# Patient Record
Sex: Male | Born: 1955
Health system: Southern US, Community
[De-identification: ages and names within clinical notes are randomized; demographics above are authoritative.]

## PROBLEM LIST (undated history)

## (undated) DIAGNOSIS — I739 Peripheral vascular disease, unspecified: Secondary | ICD-10-CM

## (undated) DIAGNOSIS — Z7901 Long term (current) use of anticoagulants: Secondary | ICD-10-CM

## (undated) DIAGNOSIS — Z91199 Patient's noncompliance with other medical treatment and regimen due to unspecified reason: Secondary | ICD-10-CM

## (undated) DIAGNOSIS — I2510 Atherosclerotic heart disease of native coronary artery without angina pectoris: Secondary | ICD-10-CM

## (undated) DIAGNOSIS — I10 Essential (primary) hypertension: Secondary | ICD-10-CM

## (undated) DIAGNOSIS — E119 Type 2 diabetes mellitus without complications: Secondary | ICD-10-CM

## (undated) DIAGNOSIS — I4891 Unspecified atrial fibrillation: Secondary | ICD-10-CM

## (undated) DIAGNOSIS — Z9119 Patient's noncompliance with other medical treatment and regimen: Secondary | ICD-10-CM

## (undated) DIAGNOSIS — I219 Acute myocardial infarction, unspecified: Secondary | ICD-10-CM

## (undated) DIAGNOSIS — I509 Heart failure, unspecified: Secondary | ICD-10-CM

## (undated) DIAGNOSIS — E7800 Pure hypercholesterolemia, unspecified: Secondary | ICD-10-CM

## (undated) DIAGNOSIS — Z95810 Presence of automatic (implantable) cardiac defibrillator: Secondary | ICD-10-CM

## (undated) HISTORY — DX: Long term (current) use of anticoagulants: Z79.01

## (undated) HISTORY — PX: IMPLANTABLE CARDIOVERTER DEFIBRILLATOR IMPLANT: SHX5860

## (undated) HISTORY — DX: Atherosclerotic heart disease of native coronary artery without angina pectoris: I25.10

## (undated) HISTORY — DX: Unspecified atrial fibrillation: I48.91

## (undated) HISTORY — DX: Peripheral vascular disease, unspecified: I73.9

## (undated) HISTORY — DX: Patient's noncompliance with other medical treatment and regimen due to unspecified reason: Z91.199

## (undated) HISTORY — DX: Patient's noncompliance with other medical treatment and regimen: Z91.19

---

## 2000-09-24 ENCOUNTER — Encounter: Payer: Self-pay | Admitting: Emergency Medicine

## 2000-09-24 ENCOUNTER — Emergency Department (HOSPITAL_COMMUNITY): Admission: EM | Admit: 2000-09-24 | Discharge: 2000-09-25 | Payer: Self-pay

## 2003-12-21 ENCOUNTER — Ambulatory Visit: Payer: Self-pay | Admitting: Internal Medicine

## 2003-12-21 ENCOUNTER — Ambulatory Visit: Payer: Self-pay | Admitting: *Deleted

## 2004-03-14 ENCOUNTER — Ambulatory Visit: Payer: Self-pay | Admitting: Internal Medicine

## 2004-03-21 ENCOUNTER — Ambulatory Visit: Payer: Self-pay | Admitting: Internal Medicine

## 2004-03-28 ENCOUNTER — Ambulatory Visit: Payer: Self-pay | Admitting: Internal Medicine

## 2004-10-01 ENCOUNTER — Ambulatory Visit: Payer: Self-pay | Admitting: Internal Medicine

## 2005-03-11 ENCOUNTER — Ambulatory Visit: Payer: Self-pay | Admitting: Internal Medicine

## 2005-05-22 ENCOUNTER — Ambulatory Visit: Payer: Self-pay | Admitting: Internal Medicine

## 2006-02-06 ENCOUNTER — Ambulatory Visit: Payer: Self-pay | Admitting: Internal Medicine

## 2006-10-30 DIAGNOSIS — I10 Essential (primary) hypertension: Secondary | ICD-10-CM | POA: Insufficient documentation

## 2006-10-30 DIAGNOSIS — E109 Type 1 diabetes mellitus without complications: Secondary | ICD-10-CM | POA: Insufficient documentation

## 2006-12-10 ENCOUNTER — Encounter (INDEPENDENT_AMBULATORY_CARE_PROVIDER_SITE_OTHER): Payer: Self-pay | Admitting: *Deleted

## 2007-02-13 ENCOUNTER — Ambulatory Visit: Payer: Self-pay | Admitting: Internal Medicine

## 2008-05-30 ENCOUNTER — Inpatient Hospital Stay (HOSPITAL_COMMUNITY): Admission: EM | Admit: 2008-05-30 | Discharge: 2008-05-31 | Payer: Self-pay | Admitting: Emergency Medicine

## 2008-05-30 ENCOUNTER — Encounter (INDEPENDENT_AMBULATORY_CARE_PROVIDER_SITE_OTHER): Payer: Self-pay | Admitting: Internal Medicine

## 2010-07-05 LAB — CBC
HCT: 41 % (ref 39.0–52.0)
HCT: 41.7 % (ref 39.0–52.0)
Hemoglobin: 14.2 g/dL (ref 13.0–17.0)
MCHC: 33.8 g/dL (ref 30.0–36.0)
MCHC: 34 g/dL (ref 30.0–36.0)
MCV: 79 fL (ref 78.0–100.0)
MCV: 80.2 fL (ref 78.0–100.0)
Platelets: 232 10*3/uL (ref 150–400)
Platelets: 232 10*3/uL (ref 150–400)
RBC: 5.28 MIL/uL (ref 4.22–5.81)
RDW: 15.5 % (ref 11.5–15.5)
WBC: 11.3 10*3/uL — ABNORMAL HIGH (ref 4.0–10.5)
WBC: 9.7 10*3/uL (ref 4.0–10.5)

## 2010-07-05 LAB — URINALYSIS, ROUTINE W REFLEX MICROSCOPIC
Bilirubin Urine: NEGATIVE
Glucose, UA: >1000 mg/dL — AB
Hgb urine dipstick: NEGATIVE
Ketones, ur: NEGATIVE mg/dL
Leukocytes, UA: NEGATIVE
Nitrite: NEGATIVE
Protein, ur: >300 mg/dL — AB
Specific Gravity, Urine: 1.022 (ref 1.005–1.030)
Urobilinogen, UA: 1 mg/dL (ref 0.0–1.0)
pH: 6.5 (ref 5.0–8.0)

## 2010-07-05 LAB — COMPREHENSIVE METABOLIC PANEL
ALT: 18 U/L (ref 0–53)
Albumin: 3 g/dL — ABNORMAL LOW (ref 3.5–5.2)
Alkaline Phosphatase: 94 U/L (ref 39–117)
Potassium: 3.1 mEq/L — ABNORMAL LOW (ref 3.5–5.1)
Sodium: 138 mEq/L (ref 135–145)
Total Protein: 5.8 g/dL — ABNORMAL LOW (ref 6.0–8.3)

## 2010-07-05 LAB — DIFFERENTIAL
Lymphocytes Relative: 12 % (ref 12–46)
Lymphs Abs: 1.3 10*3/uL (ref 0.7–4.0)
Monocytes Absolute: 0.6 10*3/uL (ref 0.1–1.0)
Monocytes Relative: 5 % (ref 3–12)
Neutro Abs: 9.2 10*3/uL — ABNORMAL HIGH (ref 1.7–7.7)
Neutrophils Relative %: 82 % — ABNORMAL HIGH (ref 43–77)

## 2010-07-05 LAB — POCT I-STAT, CHEM 8
BUN: 17 mg/dL (ref 6–23)
Chloride: 100 mEq/L (ref 96–112)
Creatinine, Ser: 1 mg/dL (ref 0.4–1.5)
Glucose, Bld: 326 mg/dL — ABNORMAL HIGH (ref 70–99)
HCT: 45 % (ref 39.0–52.0)
Potassium: 3.7 mEq/L (ref 3.5–5.1)

## 2010-07-05 LAB — URINE MICROSCOPIC-ADD ON

## 2010-07-05 LAB — CARDIAC PANEL(CRET KIN+CKTOT+MB+TROPI)
CK, MB: 1.5 ng/mL (ref 0.3–4.0)
Relative Index: INVALID (ref 0.0–2.5)
Total CK: 39 U/L (ref 7–232)
Troponin I: 0.06 ng/mL (ref 0.00–0.06)
Troponin I: 0.08 ng/mL — ABNORMAL HIGH (ref 0.00–0.06)

## 2010-07-05 LAB — POCT CARDIAC MARKERS
CKMB, poc: <1 ng/mL — ABNORMAL LOW (ref 1.0–8.0)
Troponin i, poc: <0.05 ng/mL (ref 0.00–0.09)

## 2010-07-05 LAB — GLUCOSE, CAPILLARY
Glucose-Capillary: 165 mg/dL — ABNORMAL HIGH (ref 70–99)
Glucose-Capillary: 218 mg/dL — ABNORMAL HIGH (ref 70–99)
Glucose-Capillary: 237 mg/dL — ABNORMAL HIGH (ref 70–99)
Glucose-Capillary: 244 mg/dL — ABNORMAL HIGH (ref 70–99)

## 2010-07-05 LAB — LIPID PANEL
HDL: 24 mg/dL — ABNORMAL LOW (ref >39)
Total CHOL/HDL Ratio: 5.8 RATIO
VLDL: 17 mg/dL (ref 0–40)

## 2010-07-05 LAB — HEMOGLOBIN A1C: Mean Plasma Glucose: 237 mg/dL

## 2010-07-05 LAB — BRAIN NATRIURETIC PEPTIDE: Pro B Natriuretic peptide (BNP): 640 pg/mL — ABNORMAL HIGH (ref 0.0–100.0)

## 2010-08-07 NOTE — H&P (Signed)
NAMEJOHNNIE, Jacob [name] NO.:  [id number]   MEDICAL RECORD NO.:  [id number]          PATIENT TYPE:  EMS   LOCATION:  MAJO                         FACILITY:  [facility]   PHYSICIAN:  Richarda Overlie, MD       DATE OF BIRTH:  1955-03-26   DATE OF ADMISSION:  05/30/2008  DATE OF DISCHARGE:                              HISTORY & PHYSICAL   CHIEF COMPLAINT:  Dyspnea.   SUBJECTIVE:  A 55 year old male who presents to the ER with the chief  complaint of hypertensive urgency, shortness of breath.  The patient  started noticing acute onset of shortness of breath yesterday afternoon,  but has had intermittent shortness of breath over the last several  weeks.  He also noticed some bilateral lower extremity edema.  He denies  any fevers, chills or rigors or nonproductive cough.   PAST MEDICAL HISTORY:  1. Hypertension.  2. Diabetes.   SOCIAL HISTORY:  Nondrinker, no drug abuse, but smoking in the last 12  months.   ALLERGIES:  No known drug allergies.   HOME MEDICATIONS:  1. Hydrochlorothiazide 25 mg p.o. daily.  2. Pravastatin 80 mg p.o. daily.  3. Verapamil 240 mg p.o. daily.  4. Lisinopril 20 mg p.o. daily.  5. Indapamide 2.5 mg p.o. daily.  6. Aspirin 325 mg p.o. daily.   PHYSICAL EXAMINATION:  INITIAL VITAL SIGNS:  Show a blood pressure of  241/152, pulse of 120, respirations 32, temperature 97.3.  GENERAL:  The patient appears to be comfortable, currently in no acute  distress.  HEENT:  Pupils equal and reactive.  Extraocular movements intact.  LUNGS:  Bilateral basilar crackles at the bases.  CARDIOVASCULAR:  Regular rate and rhythm.  ABDOMEN:  Soft, nontender, nondistended.  EXTREMITIES:  2+ pitting edema.  NEUROLOGIC:  Cranial nerves II-XII appear to be grossly intact.   WBC 11.3, hemoglobin 14.2, hematocrit 41.7, platelet count of 232.  Sodium 136, potassium 3.7, chloride 100, glucose 326, BUN 17, creatinine  1.0.  Troponin less than 0.05.  Urinalysis shows a  protein of greater  than 300.   Chest x-ray shows acute pulmonary edema with superimposed lower lobe  atelectasis and cardiomegaly.   ASSESSMENT AND PLAN:  1. Hypertensive urgency:  The patient will be admitted to the step-      down unit for closer monitoring of his high blood pressure.  He      will be continued on his home medications, hydrochlorothiazide,      verapamil, lisinopril, and will be initiated on clonidine and IV      hydralazine.  He will be ruled out for an acute coronary syndrome.      Will check his thyroid function.  Will also check his urine to rule      out any underlying hematuria.  2. Shortness of breath with elevated B-type natriuretic peptide of 640      and acute pulmonary edema on chest x-ray:  The patient will be      started on diuresis with IV Lasix and will initiate him on  nitroglycerin paste 1 inch q.6.  Cycle cardiac enzymes.  Monitor      patient on telemetry to rule out ACS and underlying dysrhythmias as      a precipitating cause.  3. Dyslipidemia:  Continue with pravastatin.  Check a lipid panel.  4. Hypokalemia:  Replete.   DISPOSITION:  Monitor in the step-down unit.  He is a full code.      Richarda Overlie, MD  Electronically Signed     NA/MEDQ  D:  05/30/2008  T:  05/30/2008  Job:  [id number]

## 2010-08-07 NOTE — Discharge Summary (Signed)
Jacob [name], MCKIVER NO.:  [id number]   MEDICAL RECORD NO.:  [id number]          PATIENT TYPE:  INP   LOCATION:  2921                         FACILITY:  [facility]   PHYSICIAN:  Michelene Gardener, MD    DATE OF BIRTH:  [birth year]   DATE OF ADMISSION:  05/30/2008  DATE OF DISCHARGE:                               DISCHARGE SUMMARY   DISCHARGE DIAGNOSES:  1. Hypertensive emergency secondary to noncompliance.  2. Diastolic congestive heart failure with mild component of systolic      congestive heart failure with echocardiogram showing EF of 50%.  3. Mild pericardial effusion.  4. Mild pleural effusion.  5. Diabetes mellitus.  6. Hyperlipidemia.   DISCHARGE MEDICATIONS:  1. Hydrochlorothiazide 25 mg once a day.  2. Pravastatin 80 mg once a day.  3. Verapamil 240 mg once a day.  4. Lisinopril 200 mg once a day.  5. Indapamide 2.5 mg once a day.  6. Aspirin 325 mg once a day.   CONSULTATIONS:  None.   PROCEDURES:  None.   RADIOLOGY STUDIES:  1. Chest x-ray on May 30, 2008, showed acute pulmonary edema with      cardiomegaly.  2. Echocardiogram on May 30, 2008 showed ejection fraction of 50%      with no wall motion abnormalities and the small pericardial      effusion with moderate size left pleural effusion.   COURSE OF HOSPITALIZATION:  1. Hypertensive emergency secondary to noncompliance.  This patient is      a known hypertensive and he has been on hydrochlorothiazide,      verapamil, and lisinopril.  He has stopped taking his medication      since September.  He came in with increasing shortness of breath.      Blood pressure was found to be 240/150.  The patient was admitted      to Step-Down unit.  He was restarted on his medications with p.r.n.      medications.  The patient's blood pressure improved very well.      Currently, as of seen him his last recorded blood pressure is      115/62.  The patient himself feels very fine.  There is no chest  pain.  There is no shortness of breath.  We will be discharge on      his home medications that include hydrochlorothiazide in addition      to verapamil and lisinopril.  2. Diastolic congestive heart failure.  His current admission x-ray      showed congestion with small pleural effusion and pericardial      effusion.  I discussed those findings with him and I urged him to      stay in the hospital to continue on diuresis and to repeat his x-      ray.  The patient insisted of leaving the hospital and stated that      if we did not discharge him then he would leave against medical      advice.  The patient has no symptoms.  He  does not have chest pain.      His shortness of breath resolved.  I will resume his medications      including ACE inhibitors and lisinopril.  I will also put him on      Lasix to be taken at least for 1 week until he is reevaluated by      his primary doctor.  Echocardiogram was done and showed normal      ejection fraction with small pericardial effusion.  3. Pericardial effusion.  This is a very small.  No need for further      intervention at the present time.  The patient was made aware of      his condition and he was advised to follow with his primary doctor      in that regard.  4. Left pleural effusion.  He has pleural effusion that seems to be      part of his diastolic congestive heart failure.  As mentioned      above, he was in ACE inhibitors that was restarted.  I will also      discharge him on Lasix.  The patient was advised to contact      [facility] for followup and to repeat his x-ray within a few days.  5. Diabetes mellitus.  His medications were restarted in the hospital.  6. Hyperlipidemia.  He will be restarted pravastatin.   As the patient has been noncompliant on his medications and was not  taking any of his medications since last September and apparently his  current condition exacerbated because of his noncompliance.  I have a  long  discussion with him regarding compliance with medications and he  stated that he would be more compliant.  I will give him prescription  for 1 month and he will follow with [facility] for further adjustment  of his medications.  Currently, he is very stable.  His exam is  negative.  He does not have any chest pain and he does not have  shortness of breath.  He refused repeat x-ray to follow his congestion.   Total assessment time is 40 minutes.      Michelene Gardener, MD  Electronically Signed     NAE/MEDQ  D:  05/31/2008  T:  06/01/2008  Job:  [id number]

## 2011-10-01 ENCOUNTER — Inpatient Hospital Stay (HOSPITAL_COMMUNITY)
Admission: EM | Admit: 2011-10-01 | Discharge: 2011-10-02 | DRG: 195 | Discharge status: Against Medical Advice | Payer: Medicaid Other | Attending: Internal Medicine | Admitting: Internal Medicine

## 2011-10-01 ENCOUNTER — Emergency Department (HOSPITAL_COMMUNITY): Payer: Medicaid Other

## 2011-10-01 ENCOUNTER — Encounter (HOSPITAL_COMMUNITY): Payer: Self-pay | Admitting: *Deleted

## 2011-10-01 DIAGNOSIS — F17200 Nicotine dependence, unspecified, uncomplicated: Secondary | ICD-10-CM | POA: Diagnosis present

## 2011-10-01 DIAGNOSIS — I10 Essential (primary) hypertension: Secondary | ICD-10-CM | POA: Diagnosis present

## 2011-10-01 DIAGNOSIS — I4891 Unspecified atrial fibrillation: Secondary | ICD-10-CM | POA: Diagnosis present

## 2011-10-01 DIAGNOSIS — E109 Type 1 diabetes mellitus without complications: Secondary | ICD-10-CM | POA: Diagnosis present

## 2011-10-01 DIAGNOSIS — E876 Hypokalemia: Secondary | ICD-10-CM | POA: Diagnosis present

## 2011-10-01 DIAGNOSIS — Z79899 Other long term (current) drug therapy: Secondary | ICD-10-CM

## 2011-10-01 DIAGNOSIS — I482 Chronic atrial fibrillation, unspecified: Secondary | ICD-10-CM

## 2011-10-01 DIAGNOSIS — I509 Heart failure, unspecified: Secondary | ICD-10-CM | POA: Diagnosis present

## 2011-10-01 DIAGNOSIS — J189 Pneumonia, unspecified organism: Principal | ICD-10-CM

## 2011-10-01 HISTORY — DX: Essential (primary) hypertension: I10

## 2011-10-01 LAB — URINALYSIS, ROUTINE W REFLEX MICROSCOPIC
Ketones, ur: NEGATIVE mg/dL
Nitrite: NEGATIVE
Urobilinogen, UA: 2 mg/dL — ABNORMAL HIGH (ref 0.0–1.0)

## 2011-10-01 LAB — URINE MICROSCOPIC-ADD ON

## 2011-10-01 LAB — COMPREHENSIVE METABOLIC PANEL
AST: 19 U/L (ref 0–37)
Albumin: 2.5 g/dL — ABNORMAL LOW (ref 3.5–5.2)
Alkaline Phosphatase: 157 U/L — ABNORMAL HIGH (ref 39–117)
BUN: 18 mg/dL (ref 6–23)
Chloride: 98 mEq/L (ref 96–112)
Potassium: 3.5 mEq/L (ref 3.5–5.1)
Total Bilirubin: 0.9 mg/dL (ref 0.3–1.2)
Total Protein: 6.5 g/dL (ref 6.0–8.3)

## 2011-10-01 LAB — CBC WITH DIFFERENTIAL/PLATELET
Basophils Absolute: 0 10*3/uL (ref 0.0–0.1)
Basophils Relative: 0 % (ref 0–1)
Eosinophils Absolute: 0.1 10*3/uL (ref 0.0–0.7)
Hemoglobin: 15.1 g/dL (ref 13.0–17.0)
MCH: 25.9 pg — ABNORMAL LOW (ref 26.0–34.0)
MCHC: 33.2 g/dL (ref 30.0–36.0)
Monocytes Relative: 14 % — ABNORMAL HIGH (ref 3–12)
Neutro Abs: 7.9 10*3/uL — ABNORMAL HIGH (ref 1.7–7.7)
Neutrophils Relative %: 72 % (ref 43–77)
Platelets: 251 10*3/uL (ref 150–400)
RDW: 17 % — ABNORMAL HIGH (ref 11.5–15.5)

## 2011-10-01 LAB — CBC
Hemoglobin: 15.2 g/dL (ref 13.0–17.0)
MCH: 25.6 pg — ABNORMAL LOW (ref 26.0–34.0)
MCHC: 32.6 g/dL (ref 30.0–36.0)

## 2011-10-01 LAB — CARDIAC PANEL(CRET KIN+CKTOT+MB+TROPI): Relative Index: INVALID (ref 0.0–2.5)

## 2011-10-01 LAB — PRO B NATRIURETIC PEPTIDE: Pro B Natriuretic peptide (BNP): 3806 pg/mL — ABNORMAL HIGH (ref 0–125)

## 2011-10-01 MED ORDER — SODIUM CHLORIDE 0.9 % IV SOLN: 250.0000 mL | INTRAVENOUS | Status: DC | PRN

## 2011-10-01 MED ORDER — DOCUSATE SODIUM 100 MG PO CAPS
100.0000 mg | ORAL_CAPSULE | Freq: Two times a day (BID) | ORAL | Status: DC
  Administered 2011-10-01 – 2011-10-02 (×2): 100 mg via ORAL
  Filled 2011-10-01 (×3): qty 1

## 2011-10-01 MED ORDER — DEXTROSE 5 % IV SOLN
500.0000 mg | INTRAVENOUS | Status: DC
  Administered 2011-10-01: 500 mg via INTRAVENOUS
  Filled 2011-10-01 (×2): qty 500

## 2011-10-01 MED ORDER — LISINOPRIL 20 MG PO TABS
30.0000 mg | ORAL_TABLET | Freq: Every day | ORAL | Status: DC
  Administered 2011-10-02: 30 mg via ORAL
  Filled 2011-10-01: qty 1

## 2011-10-01 MED ORDER — ACETAMINOPHEN 650 MG RE SUPP: 650.0000 mg | Freq: Four times a day (QID) | RECTAL | Status: DC | PRN

## 2011-10-01 MED ORDER — ONDANSETRON HCL 4 MG PO TABS: 4.0000 mg | ORAL_TABLET | Freq: Four times a day (QID) | ORAL | Status: DC | PRN

## 2011-10-01 MED ORDER — ENOXAPARIN SODIUM 40 MG/0.4ML ~~LOC~~ SOLN
40.0000 mg | SUBCUTANEOUS | Status: DC
  Administered 2011-10-01: 40 mg via SUBCUTANEOUS
  Filled 2011-10-01 (×2): qty 0.4

## 2011-10-01 MED ORDER — METOPROLOL TARTRATE 50 MG PO TABS
50.0000 mg | ORAL_TABLET | Freq: Two times a day (BID) | ORAL | Status: DC
  Administered 2011-10-01 – 2011-10-02 (×2): 50 mg via ORAL
  Filled 2011-10-01 (×3): qty 1

## 2011-10-01 MED ORDER — IPRATROPIUM BROMIDE 0.02 % IN SOLN
0.5000 mg | Freq: Four times a day (QID) | RESPIRATORY_TRACT | Status: DC
  Administered 2011-10-01 – 2011-10-02 (×3): 0.5 mg via RESPIRATORY_TRACT
  Filled 2011-10-01 (×4): qty 2.5

## 2011-10-01 MED ORDER — LEVALBUTEROL HCL 0.63 MG/3ML IN NEBU
0.6300 mg | INHALATION_SOLUTION | Freq: Four times a day (QID) | RESPIRATORY_TRACT | Status: DC
  Filled 2011-10-01 (×3): qty 3

## 2011-10-01 MED ORDER — GLIPIZIDE 5 MG PO TABS
5.0000 mg | ORAL_TABLET | Freq: Every day | ORAL | Status: DC
  Administered 2011-10-02: 5 mg via ORAL
  Filled 2011-10-01: qty 1

## 2011-10-01 MED ORDER — DEXTROSE 5 % IV SOLN
1.0000 g | INTRAVENOUS | Status: DC
  Administered 2011-10-01: 1 g via INTRAVENOUS
  Filled 2011-10-01 (×2): qty 10

## 2011-10-01 MED ORDER — SODIUM CHLORIDE 0.9 % IJ SOLN
3.0000 mL | Freq: Two times a day (BID) | INTRAMUSCULAR | Status: DC
  Administered 2011-10-01: 3 mL via INTRAVENOUS

## 2011-10-01 MED ORDER — SODIUM CHLORIDE 0.9 % IV SOLN
Freq: Once | INTRAVENOUS | Status: AC
  Administered 2011-10-01: 15:00:00 via INTRAVENOUS

## 2011-10-01 MED ORDER — ONDANSETRON HCL 4 MG/2ML IJ SOLN: 4.0000 mg | Freq: Four times a day (QID) | INTRAMUSCULAR | Status: DC | PRN

## 2011-10-01 MED ORDER — FUROSEMIDE 10 MG/ML IJ SOLN
60.0000 mg | Freq: Once | INTRAMUSCULAR | Status: AC
  Administered 2011-10-01: 60 mg via INTRAVENOUS
  Filled 2011-10-01: qty 8

## 2011-10-01 MED ORDER — FUROSEMIDE 10 MG/ML IJ SOLN
60.0000 mg | Freq: Two times a day (BID) | INTRAMUSCULAR | Status: DC
  Administered 2011-10-01 – 2011-10-02 (×2): 60 mg via INTRAVENOUS
  Filled 2011-10-01 (×4): qty 6

## 2011-10-01 MED ORDER — LEVALBUTEROL HCL 0.63 MG/3ML IN NEBU
0.6300 mg | INHALATION_SOLUTION | Freq: Four times a day (QID) | RESPIRATORY_TRACT | Status: DC
  Administered 2011-10-01 – 2011-10-02 (×3): 0.63 mg via RESPIRATORY_TRACT
  Filled 2011-10-01 (×7): qty 3

## 2011-10-01 MED ORDER — SODIUM CHLORIDE 0.9 % IV SOLN
INTRAVENOUS | Status: AC
  Administered 2011-10-01: 18:00:00 via INTRAVENOUS

## 2011-10-01 MED ORDER — METOPROLOL TARTRATE 12.5 MG HALF TABLET
12.5000 mg | ORAL_TABLET | Freq: Two times a day (BID) | ORAL | Status: DC
  Filled 2011-10-01: qty 1

## 2011-10-01 MED ORDER — METOPROLOL TARTRATE 1 MG/ML IV SOLN: 5.0000 mg | Freq: Four times a day (QID) | INTRAVENOUS | Status: DC | PRN

## 2011-10-01 MED ORDER — SODIUM CHLORIDE 0.9 % IJ SOLN: 3.0000 mL | INTRAMUSCULAR | Status: DC | PRN

## 2011-10-01 MED ORDER — SODIUM CHLORIDE 0.9 % IJ SOLN
3.0000 mL | Freq: Two times a day (BID) | INTRAMUSCULAR | Status: DC
  Administered 2011-10-02: 3 mL via INTRAVENOUS

## 2011-10-01 MED ORDER — ACETAMINOPHEN 325 MG PO TABS: 650.0000 mg | ORAL_TABLET | Freq: Four times a day (QID) | ORAL | Status: DC | PRN

## 2011-10-01 MED ORDER — DILTIAZEM HCL 25 MG/5ML IV SOLN
10.0000 mg | Freq: Once | INTRAVENOUS | Status: AC
  Administered 2011-10-01: 10 mg via INTRAVENOUS
  Filled 2011-10-01 (×2): qty 5

## 2011-10-01 NOTE — H&P (Signed)
Jacob [name] MRN: [id number] DOB/AGE: January 05, 1956 56 y.o. Primary Care Physician:No primary provider on file. Admit date: 10/01/2011 Chief Complaint: Leg Swelling  HPI: The history is provided by the patient. 56 yr old male  Presents  with bilateral lower extremity edema x1 week and dyspnea on exertion.He has a hx of DM but discontinued all meds due to lack of insurance .Has not had any PCP follow up in several years .  No chest pain or chest pressure but some nonproductive cough  For the last couple of days . No prior history of CHF. Does not take any diuretics. No fever or chills. No vomiting or diarrhea. Nothing makes the symptoms better and no medications used prior to arrival. He does have a history of high blood pressure diabetes. No pleuritic chest pain noted or recent travel history.  He has noticed dependent edema which was more noticeable a week ago and has worsened since . He denies any DOE , PND, No cardiac HX this far   Past Medical History  Diagnosis Date  . Diabetes mellitus   . Hypertension     History reviewed. No pertinent past surgical history.  Prior to Admission medications   Medication Sig Start Date End Date Taking? Authorizing Provider  glipiZIDE (GLUCOTROL) 5 MG tablet Take 5 mg by mouth daily.   Yes Historical Provider, MD  hydrochlorothiazide (HYDRODIURIL) 25 MG tablet Take 25 mg by mouth daily.   Yes Historical Provider, MD  lisinopril (PRINIVIL,ZESTRIL) 30 MG tablet Take 30 mg by mouth daily.   Yes Historical Provider, MD  verapamil (COVERA HS) 240 MG (CO) 24 hr tablet Take 240 mg by mouth at bedtime.   Yes Historical Provider, MD    Allergies: No Known Allergies   family history. Mother has DM    Social History:  reports that he has been smoking.  He has never used smokeless tobacco. He reports that he does not drink alcohol or use illicit drugs. He is currently unemployed , [employer]    PHYSICAL EXAM: Nursing note and vitals reviewed.    Constitutional: He is oriented to person, place, and time. He appears well-developed and well-nourished. Non-toxic appearance. No distress.  HENT:  Head: Normocephalic and atraumatic.  Eyes: Conjunctivae, EOM and lids are normal. Pupils are equal, round, and reactive to light.  Neck: Normal range of motion. Neck supple. No tracheal deviation present. No mass present.  Cardiovascular: Normal rate, regular rhythm and normal heart sounds. Exam reveals no gallop.  No murmur heard.  Pulmonary/Chest: Effort normal and breath sounds normal. No stridor. No respiratory distress. He has no decreased breath sounds. He has no wheezes. He has no rhonchi. He has no rales.  Abdominal: Soft. Normal appearance and bowel sounds are normal. He exhibits no distension. There is no tenderness. There is no rebound and no CVA tenderness.  Musculoskeletal: Normal range of motion. He exhibits no edema and no tenderness.  Bilateral 3+ lower extremity edema  Neurological: He is alert and oriented to person, place, and time. He has normal strength. No cranial nerve deficit or sensory deficit. GCS eye subscore is 4. GCS verbal subscore is 5. GCS motor subscore is 6.  Skin: Skin is warm and dry. No abrasion and no rash noted.  Psychiatric: He has a normal mood and affect. His speech is normal and behavior is normal.   ROS   Complete 14 point ROS was done with pertinenet positives listed in HPI otherwise negative     No results found  for this or any previous visit (from the past 240 hour(s)).   Results for orders placed during the hospital encounter of 10/01/11 (from the past 48 hour(s))  URINALYSIS, ROUTINE W REFLEX MICROSCOPIC     Status: Abnormal   Collection Time   10/01/11  1:52 PM      Component Value Range Comment   Color, Urine AMBER (*) YELLOW BIOCHEMICALS MAY BE AFFECTED BY COLOR   APPearance CLEAR  CLEAR    Specific Gravity, Urine 1.020  1.005 - 1.030    pH 7.0  5.0 - 8.0    Glucose, UA NEGATIVE  NEGATIVE  mg/dL    Hgb urine dipstick NEGATIVE  NEGATIVE    Bilirubin Urine SMALL (*) NEGATIVE    Ketones, ur NEGATIVE  NEGATIVE mg/dL    Protein, ur >811 (*) NEGATIVE mg/dL    Urobilinogen, UA 2.0 (*) 0.0 - 1.0 mg/dL    Nitrite NEGATIVE  NEGATIVE    Leukocytes, UA NEGATIVE  NEGATIVE   URINE MICROSCOPIC-ADD ON     Status: Normal   Collection Time   10/01/11  1:52 PM      Component Value Range Comment   Squamous Epithelial / LPF RARE  RARE    WBC, UA 0-2  <3 WBC/hpf    RBC / HPF 0-2  <3 RBC/hpf    Urine-Other MUCOUS PRESENT     GLUCOSE, CAPILLARY     Status: Normal   Collection Time   10/01/11  1:58 PM      Component Value Range Comment   Glucose-Capillary 84  70 - 99 mg/dL   CBC WITH DIFFERENTIAL     Status: Abnormal   Collection Time   10/01/11  2:30 PM      Component Value Range Comment   WBC 11.0 (*) 4.0 - 10.5 K/uL    RBC 5.84 (*) 4.22 - 5.81 MIL/uL    Hemoglobin 15.1  13.0 - 17.0 g/dL    HCT 91.4  78.2 - 95.6 %    MCV 77.9 (*) 78.0 - 100.0 fL    MCH 25.9 (*) 26.0 - 34.0 pg    MCHC 33.2  30.0 - 36.0 g/dL    RDW 21.3 (*) 08.6 - 15.5 %    Platelets 251  150 - 400 K/uL    Neutrophils Relative 72  43 - 77 %    Neutro Abs 7.9 (*) 1.7 - 7.7 K/uL    Lymphocytes Relative 13  12 - 46 %    Lymphs Abs 1.4  0.7 - 4.0 K/uL    Monocytes Relative 14 (*) 3 - 12 %    Monocytes Absolute 1.6 (*) 0.1 - 1.0 K/uL    Eosinophils Relative 1  0 - 5 %    Eosinophils Absolute 0.1  0.0 - 0.7 K/uL    Basophils Relative 0  0 - 1 %    Basophils Absolute 0.0  0.0 - 0.1 K/uL   COMPREHENSIVE METABOLIC PANEL     Status: Abnormal   Collection Time   10/01/11  2:30 PM      Component Value Range Comment   Sodium 137  135 - 145 mEq/L    Potassium 3.5  3.5 - 5.1 mEq/L    Chloride 98  96 - 112 mEq/L    CO2 28  19 - 32 mEq/L    Glucose, Bld 73  70 - 99 mg/dL    BUN 18  6 - 23 mg/dL    Creatinine, Ser 5.78  0.50 -  1.35 mg/dL    Calcium 8.6  8.4 - 16.1 mg/dL    Total Protein 6.5  6.0 - 8.3 g/dL    Albumin 2.5 (*) 3.5  - 5.2 g/dL    AST 19  0 - 37 U/L    ALT 17  0 - 53 U/L    Alkaline Phosphatase 157 (*) 39 - 117 U/L    Total Bilirubin 0.9  0.3 - 1.2 mg/dL    GFR calc non Af Amer >90  >90 mL/min    GFR calc Af Amer >90  >90 mL/min   PRO B NATRIURETIC PEPTIDE     Status: Abnormal   Collection Time   10/01/11  2:30 PM      Component Value Range Comment   Pro B Natriuretic peptide (BNP) 3806.0 (*) 0 - 125 pg/mL   CBC     Status: Abnormal   Collection Time   10/01/11  4:55 PM      Component Value Range Comment   WBC 11.3 (*) 4.0 - 10.5 K/uL    RBC 5.94 (*) 4.22 - 5.81 MIL/uL    Hemoglobin 15.2  13.0 - 17.0 g/dL    HCT 09.6  04.5 - 40.9 %    MCV 78.5  78.0 - 100.0 fL    MCH 25.6 (*) 26.0 - 34.0 pg    MCHC 32.6  30.0 - 36.0 g/dL    RDW 81.1 (*) 91.4 - 15.5 %    Platelets 267  150 - 400 K/uL   CARDIAC PANEL(CRET KIN+CKTOT+MB+TROPI)     Status: Normal   Collection Time   10/01/11  4:55 PM      Component Value Range Comment   Total CK 42  7 - 232 U/L    CK, MB 2.7  0.3 - 4.0 ng/mL    Troponin I <0.30  <0.30 ng/mL    Relative Index RELATIVE INDEX IS INVALID  0.0 - 2.5   GLUCOSE, CAPILLARY     Status: Normal   Collection Time   10/01/11  5:36 PM      Component Value Range Comment   Glucose-Capillary 88  70 - 99 mg/dL     Dg Chest 2 View  09/30/2954  *RADIOLOGY REPORT*  Clinical Data: 1-week history of bilateral lower extremity edema. Shortness of breath.  Smoker with history of hypertension and diabetes.  CHEST - 2 VIEW  Comparison: Portable chest x-ray 05/30/2008.  Findings: Heart markedly enlarged but stable.  Thoracic aorta mildly atherosclerotic, unchanged.  Hilar and mediastinal contours otherwise unremarkable.  Pulmonary venous hypertension with minimal interstitial pulmonary edema.  Large right pleural effusion and associated dense consolidation in the right lower lobe.  Small left pleural effusion and minimal passive atelectasis in the left lower lobe.  Fluid extends into the major fissure on the  right.  Mild degenerative changes involving the thoracic spine.  IMPRESSION: Mild CHF, with stable marked cardiomegaly and minimal diffuse interstitial pulmonary edema.  Large right pleural effusion and associated dense passive atelectasis and/or pneumonia in the right lower lobe.  Small left pleural effusion and associated mild passive atelectasis in the left lower lobe.  Original Report Authenticated By: Arnell Sieving, M.D.    Impression:  Active Problems:  DIABETES MELLITUS, TYPE I  HYPERTENSION  A-fib  PNA (pneumonia)     Plan: 1. CAP , RX with rocephin and AZA 2. SOB due to 1. As well as lkely CHF , BNP significantly high, check TSH , 2 d echo,enzymes 3.  Dependent edema: likely due to volume overload , CHF , initiate aggressive diuresis  4. DM SSI , hgA1C ,   Full code       [facility] 10/01/2011, 10:24 PM

## 2011-10-01 NOTE — ED Notes (Signed)
md at bedside  Pt alert and oriented x4. Respirations even and unlabored, bilateral symmetrical rise and fall of chest. Skin warm and dry. In no acute distress. Denies needs.   

## 2011-10-01 NOTE — ED Notes (Signed)
First attempt to call report. Floor rn will call back 

## 2011-10-01 NOTE — ED Notes (Signed)
Pt requested for security to go to his car and get his cell phone out of his car. Security alerted rn who told pt that they were not allowed to do that due to liability reasons. rn found pts contact information on his demographics sheet. Pt reported one of the numbers was his cell phone and was one an old work Clinical cytogeneticist. Pt tried to remember sons phone number, but number does not receive phone calls. rn asked International aid/development worker and she stated the same reason that security cannot go into a pts car for liability reasons. All of this was explain in detail to pt.

## 2011-10-01 NOTE — ED Provider Notes (Signed)
History     CSN: [id number]  Arrival date & time 10/01/11  1256   First MD Initiated Contact with Patient 10/01/11 1357      Chief Complaint  Patient presents with  . Leg Swelling    (Consider location/radiation/quality/duration/timing/severity/associated sxs/prior treatment) The history is provided by the patient.   patient here with bilateral lower extremity edema x1 week and dyspnea on exertion. No chest pain or chest pressure but some nonproductive cough noted. No prior history of CHF. Does not take any diuretics. No fever or chills. No vomiting or diarrhea. Nothing makes the symptoms better and no medications used prior to arrival. He does have a history of high blood pressure diabetes. No pleuritic chest pain noted or recent travel history  Past Medical History  Diagnosis Date  . Diabetes mellitus   . Hypertension     History reviewed. No pertinent past surgical history.  No family history on file.  History  Substance Use Topics  . Smoking status: Current Everyday Smoker -- 0.5 packs/day  . Smokeless tobacco: Not on file  . Alcohol Use: No      Review of Systems  All other systems reviewed and are negative.    Allergies  Review of patient's allergies indicates no known allergies.  Home Medications   Current Outpatient Rx  Name Route Sig Dispense Refill  . GLIPIZIDE 5 MG PO TABS Oral Take 5 mg by mouth daily.    Marland Kitchen HYDROCHLOROTHIAZIDE 25 MG PO TABS Oral Take 25 mg by mouth daily.    Marland Kitchen LISINOPRIL 30 MG PO TABS Oral Take 30 mg by mouth daily.    Marland Kitchen VERAPAMIL HCL ER (CO) 240 MG PO TB24 Oral Take 240 mg by mouth at bedtime.      BP 145/87  Pulse 72  Temp 98 F (36.7 C) (Oral)  Resp 24  Wt 224 lb (101.606 kg)  SpO2 97%  Physical Exam  Nursing note and vitals reviewed. Constitutional: He is oriented to person, place, and time. He appears well-developed and well-nourished.  Non-toxic appearance. No distress.  HENT:  Head: Normocephalic and atraumatic.    Eyes: Conjunctivae, EOM and lids are normal. Pupils are equal, round, and reactive to light.  Neck: Normal range of motion. Neck supple. No tracheal deviation present. No mass present.  Cardiovascular: Normal rate, regular rhythm and normal heart sounds.  Exam reveals no gallop.   No murmur heard. Pulmonary/Chest: Effort normal and breath sounds normal. No stridor. No respiratory distress. He has no decreased breath sounds. He has no wheezes. He has no rhonchi. He has no rales.  Abdominal: Soft. Normal appearance and bowel sounds are normal. He exhibits no distension. There is no tenderness. There is no rebound and no CVA tenderness.  Musculoskeletal: Normal range of motion. He exhibits no edema and no tenderness.       Bilateral 3+ lower extremity edema  Neurological: He is alert and oriented to person, place, and time. He has normal strength. No cranial nerve deficit or sensory deficit. GCS eye subscore is 4. GCS verbal subscore is 5. GCS motor subscore is 6.  Skin: Skin is warm and dry. No abrasion and no rash noted.  Psychiatric: He has a normal mood and affect. His speech is normal and behavior is normal.    ED Course  Procedures (including critical care time)   Labs Reviewed  GLUCOSE, CAPILLARY  URINALYSIS, ROUTINE W REFLEX MICROSCOPIC  CBC WITH DIFFERENTIAL  COMPREHENSIVE METABOLIC PANEL  PRO B NATRIURETIC PEPTIDE  No results found.   No diagnosis found.    MDM   Date: 10/01/2011  Rate: 112   Rhythm: atrial fibrillation  QRS Axis: normal  Intervals: normal  ST/T Wave abnormalities: nonspecific ST changes  Conduction Disutrbances:afib  Narrative Interpretation:   Old EKG Reviewed: none available  3:59 PM Pt given lasix for chf and antibiotics for suspected hcap--will admit to [facility]          Toy Baker, MD 10/01/11 1600

## 2011-10-01 NOTE — ED Notes (Signed)
Pt states "used to go to [facility] but they declined me and never went back, my legs have been swelling x 1 wk"

## 2011-10-01 NOTE — ED Notes (Signed)
Report given to beth, rn on floor.

## 2011-10-02 DIAGNOSIS — J189 Pneumonia, unspecified organism: Principal | ICD-10-CM

## 2011-10-02 DIAGNOSIS — M7989 Other specified soft tissue disorders: Secondary | ICD-10-CM

## 2011-10-02 DIAGNOSIS — I369 Nonrheumatic tricuspid valve disorder, unspecified: Secondary | ICD-10-CM

## 2011-10-02 DIAGNOSIS — I509 Heart failure, unspecified: Secondary | ICD-10-CM

## 2011-10-02 DIAGNOSIS — I10 Essential (primary) hypertension: Secondary | ICD-10-CM

## 2011-10-02 LAB — CARDIAC PANEL(CRET KIN+CKTOT+MB+TROPI)
CK, MB: 2.8 ng/mL (ref 0.3–4.0)
Relative Index: INVALID (ref 0.0–2.5)
Relative Index: INVALID (ref 0.0–2.5)
Total CK: 55 U/L (ref 7–232)
Total CK: 65 U/L (ref 7–232)
Troponin I: <0.3 ng/mL (ref <0.30)

## 2011-10-02 LAB — COMPREHENSIVE METABOLIC PANEL
BUN: 17 mg/dL (ref 6–23)
Calcium: 8.5 mg/dL (ref 8.4–10.5)
GFR calc Af Amer: >90 mL/min (ref >90)
Glucose, Bld: 116 mg/dL — ABNORMAL HIGH (ref 70–99)
Sodium: 135 mEq/L (ref 135–145)
Total Protein: 6.5 g/dL (ref 6.0–8.3)

## 2011-10-02 LAB — TSH: TSH: 1.519 u[IU]/mL (ref 0.350–4.500)

## 2011-10-02 LAB — CBC
HCT: 45.1 % (ref 39.0–52.0)
Hemoglobin: 14.6 g/dL (ref 13.0–17.0)
RBC: 5.68 MIL/uL (ref 4.22–5.81)
WBC: 10.3 10*3/uL (ref 4.0–10.5)

## 2011-10-02 LAB — GLUCOSE, CAPILLARY: Glucose-Capillary: 116 mg/dL — ABNORMAL HIGH (ref 70–99)

## 2011-10-02 NOTE — Progress Notes (Addendum)
VASCULAR LAB PRELIMINARY  PRELIMINARY  PRELIMINARY  PRELIMINARY  Bilateral lower extremity venous duplex  completed.    Preliminary report:  Bilateral:  No evidence of DVT, superficial thrombosis, or Baker's Cyst.  Veins pulsatile consistent with fluid overload.   Johney Perotti, RVT 10/02/2011, 9:47 AM

## 2011-10-02 NOTE — Discharge Summary (Signed)
Physician Discharge Summary  Jacob [name] ZOX:[id number] DOB: February 05, 1956 DOA: 10/01/2011  PCP: No primary provider on file.  Admit date: 10/01/2011 Discharge date: 10/02/2011  Recommendations for Outpatient Follow-up:  1. Patient left AGAINST MEDICAL ADVICE, no PCP 2. Pending studies  2-D echocardiogram done and results pending at the time patient left AMA  Potassium level,-serum K3.0 the time he left AMA Discharge Diagnoses:  Active Problems:  DIABETES MELLITUS, TYPE I  HYPERTENSION  A-fib  PNA (pneumonia)  hypokalemia  Discharge Condition: Hemodynamically stable, and left AMA  Diet recommendation: Heart healthy  History of present illness:  The history is provided by the patient. 56 yr old male Presents with bilateral lower extremity edema x1 week and dyspnea on exertion.He has a hx of DM but discontinued all meds due to lack of insurance .Has not had any PCP follow up in several years . No chest pain or chest pressure but some nonproductive cough For the last couple of days . No prior history of CHF. Does not take any diuretics. No fever or chills. No vomiting or diarrhea. Nothing makes the symptoms better and no medications used prior to arrival. He does have a history of high blood pressure diabetes. No pleuritic chest pain noted or recent travel history.   Hospital Course by problem list:  Active Problems:  DIABETES MELLITUS, TYPE I  HYPERTENSION  A-fib  PNA (pneumonia) Hypokalemia 1. CAP -patient was started on rocephin and zithromax and was still receiving this at the time he left AMA today.  2. CHF -patient's BNP significantly high, cardiac enzymes were done and came back negative. He was started on IV Lasix for diuresis (which was still receiving at the time of his leaving AMA) and his shortness of breath improved. A 2-D echocardiogram was done this a.m. but results pending at the time patient left AMA. A TSH was ordered and came back within normal limits. 3. DM SSI , hgA1C  -Phyllis maintained on his glipizide, his hemoglobin A1c was 6.9.  4. Atrial fibrillation-he received IV or Cardizem and was placed on oral metoprolol during this hospital stay Procedures:  2-D echocardiogram done and results pending at the time patient left AMA  Consultations:  None  Discharge Exam: Filed Vitals:   10/02/11 1339  BP: 133/80  Pulse: 72  Temp: 97.3 F (36.3 C)  Resp: 16   Filed Vitals:   10/02/11 0149 10/02/11 0543 10/02/11 0900 10/02/11 1339  BP:  149/70  133/80  Pulse:  76  72  Temp:  97.4 F (36.3 C)  97.3 F (36.3 C)  TempSrc:  Oral  Oral  Resp:  16  16  Height:      Weight:      SpO2: 97% 92% 98% 98%   General: Middle-aged male in no apparent distress Cardiovascular: Irregularly irregular, rate controlled Respiratory: Basilar crackles, no wheezes  Discharge Instructions   Medication List  As of 10/02/2011  2:48 PM   ASK your doctor about these medications         glipiZIDE 5 MG tablet   Commonly known as: GLUCOTROL   Take 5 mg by mouth daily.      hydrochlorothiazide 25 MG tablet   Commonly known as: HYDRODIURIL   Take 25 mg by mouth daily.      lisinopril 30 MG tablet   Commonly known as: PRINIVIL,ZESTRIL   Take 30 mg by mouth daily.      verapamil 240 MG (CO) 24 hr tablet   Commonly known as:  COVERA HS   Take 240 mg by mouth at bedtime.              The results of significant diagnostics from this hospitalization (including imaging, microbiology, ancillary and laboratory) are listed below for reference.    Significant Diagnostic Studies: Dg Chest 2 View  10/01/2011  *RADIOLOGY REPORT*  Clinical Data: 1-week history of bilateral lower extremity edema. Shortness of breath.  Smoker with history of hypertension and diabetes.  CHEST - 2 VIEW  Comparison: Portable chest x-ray 05/30/2008.  Findings: Heart markedly enlarged but stable.  Thoracic aorta mildly atherosclerotic, unchanged.  Hilar and mediastinal contours otherwise  unremarkable.  Pulmonary venous hypertension with minimal interstitial pulmonary edema.  Large right pleural effusion and associated dense consolidation in the right lower lobe.  Small left pleural effusion and minimal passive atelectasis in the left lower lobe.  Fluid extends into the major fissure on the right.  Mild degenerative changes involving the thoracic spine.  IMPRESSION: Mild CHF, with stable marked cardiomegaly and minimal diffuse interstitial pulmonary edema.  Large right pleural effusion and associated dense passive atelectasis and/or pneumonia in the right lower lobe.  Small left pleural effusion and associated mild passive atelectasis in the left lower lobe.  Original Report Authenticated By: Arnell Sieving, M.D.    Microbiology: No results found for this or any previous visit (from the past 240 hour(s)).   Labs: Basic Metabolic Panel:  Lab 10/02/11 1610 10/01/11 1430  NA 135 137  K 3.0* 3.5  CL 93* 98  CO2 31 28  GLUCOSE 116* 73  BUN 17 18  CREATININE 0.97 0.88  CALCIUM 8.5 8.6  MG -- --  PHOS -- --   Liver Function Tests:  Lab 10/02/11 0456 10/01/11 1430  AST 18 19  ALT 18 17  ALKPHOS 148* 157*  BILITOT 1.0 0.9  PROT 6.5 6.5  ALBUMIN 2.7* 2.5*   No results found for this basename: LIPASE:5,AMYLASE:5 in the last 168 hours No results found for this basename: AMMONIA:5 in the last 168 hours CBC:  Lab 10/02/11 0456 10/01/11 1655 10/01/11 1430  WBC 10.3 11.3* 11.0*  NEUTROABS -- -- 7.9*  HGB 14.6 15.2 15.1  HCT 45.1 46.6 45.5  MCV 79.4 78.5 77.9*  PLT 252 267 251   Cardiac Enzymes:  Lab 10/02/11 0820 10/02/11 0047 10/01/11 1655  CKTOTAL 65 55 42  CKMB 2.8 2.8 2.7  CKMBINDEX -- -- --  TROPONINI <0.30 <0.30 <0.30   BNP: BNP (last 3 results)  Basename 10/01/11 1430  PROBNP 3806.0*   CBG:  Lab 10/02/11 1203 10/02/11 0736 10/01/11 1736 10/01/11 1358  GLUCAP 126* 116* 88 84    Time coordinating discharge:31mins  Signed:  Kela Millin  [facility] 10/02/2011, 2:48 PM

## 2011-10-02 NOTE — Care Management Note (Signed)
    Page 1 of 1   10/02/2011     1:43:06 PM   CARE MANAGEMENT NOTE 10/02/2011  Patient:  DETRELL, UMSCHEID   Account Number:  [id number]  Date Initiated:  10/02/2011  Documentation initiated by:  Lanier Clam  Subjective/Objective Assessment:   ADMITTED W/LOWER LEG SWELLING.HX:DM.     Action/Plan:   FROM HOME   Anticipated DC Date:  10/02/2011   Anticipated DC Plan:  AGAINST MEDICAL ADVICE      DC Planning Services  CM consult      Choice offered to / List presented to:             Status of service:  Completed, signed off Medicare Important Message given?   (If response is "NO", the following Medicare IM given date fields will be blank) Date Medicare IM given:   Date Additional Medicare IM given:    Discharge Disposition:  AGAINST MEDICAL ADVICE  Per UR Regulation:  Reviewed for med. necessity/level of care/duration of stay  If discussed at Long Length of Stay Meetings, dates discussed:    Comments:  10/02/11 Thu Baggett RN,BSN NCM [phone] PATIENT LEAVING AMA.

## 2011-10-02 NOTE — Progress Notes (Signed)
Patient had 4 beats of a wide complex rhythm. Asymptomatic/sitting up in chair-strip place on shadow chart in [place]-a[place]roo. Ginny Forth

## 2011-10-02 NOTE — Progress Notes (Signed)
  Echocardiogram 2D Echocardiogram has been performed.  Jacob [name] 10/02/2011, 1:26 PM

## 2011-10-02 NOTE — Progress Notes (Signed)
Patient left AMA. Dr Donna Bernard aware. Ginny Forth

## 2011-10-02 NOTE — Progress Notes (Signed)
Pt with previous A. Fibrillation on telemetry monitor. Now, NSR with frequent PAC, PVC.  Telemetry strip printed and placed in chart. Will continue to monitor. [place]

## 2011-10-02 NOTE — Progress Notes (Signed)
Patient insistent "I must leave. I must go now." Dr Donna Bernard informed earlier today of patients possible leaving AMA. Pt signed AMA paper. Will notify Dr Donna Bernard of patients actual leaving AMA. IV and telemetry d/c'ed. Pt stable upon leaving. Pt informed regarding risks of him leaving hospital AMA. Ginny Forth

## 2011-11-08 ENCOUNTER — Ambulatory Visit (INDEPENDENT_AMBULATORY_CARE_PROVIDER_SITE_OTHER): Payer: Medicaid Other | Admitting: Cardiovascular Disease

## 2011-11-08 ENCOUNTER — Encounter: Payer: Self-pay | Admitting: Cardiovascular Disease

## 2011-11-08 VITALS — BP 147/100 | HR 61 | Ht 65.0 in | Wt 221.1 lb

## 2011-11-08 DIAGNOSIS — I509 Heart failure, unspecified: Secondary | ICD-10-CM

## 2011-11-08 DIAGNOSIS — I10 Essential (primary) hypertension: Secondary | ICD-10-CM

## 2011-11-08 DIAGNOSIS — I5022 Chronic systolic (congestive) heart failure: Secondary | ICD-10-CM | POA: Insufficient documentation

## 2011-11-08 DIAGNOSIS — E109 Type 1 diabetes mellitus without complications: Secondary | ICD-10-CM

## 2011-11-08 DIAGNOSIS — I4891 Unspecified atrial fibrillation: Secondary | ICD-10-CM

## 2011-11-08 LAB — BASIC METABOLIC PANEL
Calcium: 8.9 mg/dL (ref 8.4–10.5)
GFR: 75.52 mL/min (ref >60.00)
Potassium: 3.8 mEq/L (ref 3.5–5.1)
Sodium: 138 mEq/L (ref 135–145)

## 2011-11-08 MED ORDER — CARVEDILOL 12.5 MG PO TABS: 12.5000 mg | ORAL_TABLET | Freq: Two times a day (BID) | ORAL | Status: DC

## 2011-11-08 MED ORDER — SPIRONOLACTONE 25 MG PO TABS: 25.0000 mg | ORAL_TABLET | Freq: Every day | ORAL | Status: DC

## 2011-11-08 NOTE — Assessment & Plan Note (Signed)
He has a history of hypertension. He also has atrial ablation in may have congestive heart failure related to a rapid ventricular response. He's been started on preliminary medications but then left the hospital AGAINST MEDICAL ADVICE.  He has been started on carvedilol but he still eats quite a bit of salt in the form of  fast food. He'll typically go to [employer] and eat 2 cheeseburgers. I explained the importance of eating a low salt diet.  We will increase his carvedilol to 12.5 mg twice a day. We'll continue the Lasix and lisinopril. We'll check a basic metabolic profile today to check his electrolytes. I'll see him again in 3 months.

## 2011-11-08 NOTE — Patient Instructions (Addendum)
Your physician recommends that you schedule a follow-up appointment in: 3 months   Your physician has recommended you make the following change in your medication:   INCREASE COREG TO 12.5 MG TWICE DAILY  Your physician recommends that you return for lab work in: TODAY BMET  YOU MAY HAVE YOUR TEETH REMOVED

## 2011-11-08 NOTE — Progress Notes (Signed)
    Jacob [name] Date of Birth  01-20-1956       [facility] Office [address]  [address] [place], [place]  [address]   [place], [place]  [address] [phone]     [phone]   Fax  [phone]    Fax [phone]  Problem List: 1. Chronic systolic congestive heart failure-EF of 25-30% by echo 2. Hypertension 3. Diabetes mellitus 4. Atrial Fibrillation   History of Present Illness:  The patient was recently hospitalized for CHF.  He left AGAINST MEDICAL ADVICE before completing the workup.  In looking through the chart it appears that his had some problems with noncompliance.  He has a history of hypertension.  Marland Kitchen  He eats a lot of fast food. He typically would eat  2 cheeseburgers at [facility] for dinner.  He does not cook at home.  He used to go to [employer] but now does not have a primary medical doctor.  He stopped smoking 2 months ago.  He complains that his medications are no longer working for him. 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   Current Outpatient Prescriptions on File Prior to Visit  Medication Sig Dispense Refill  . carvedilol (COREG) 6.25 MG tablet Take 6.25 mg by mouth 2 (two) times daily with a meal.      . furosemide (LASIX) 40 MG tablet Take 40 mg by mouth daily.      Marland Kitchen glipiZIDE (GLUCOTROL) 5 MG tablet Take 5 mg by mouth daily.      Marland Kitchen spironolactone (ALDACTONE) 25 MG tablet Take 25 mg by mouth daily.      . hydrochlorothiazide (HYDRODIURIL) 25 MG tablet Take 25 mg by mouth daily.      . verapamil (COVERA HS) 240 MG (CO) 24 hr tablet Take 240 mg by mouth at bedtime.        No Known Allergies  Past Medical History  Diagnosis Date  . Diabetes mellitus   . Hypertension     No past surgical history on file.  History  Smoking status  . Former Smoker -- 0.5 packs/day  Smokeless tobacco  . Never Used  Comment: Stopped 10-08-11   he works as a as a Social research officer, government of a gas  station.  History  Alcohol Use No    No family history on file.  Reviw of Systems:  Reviewed in the HPI.  All other systems are negative.  Physical Exam: Blood pressure 147/100, pulse 61, height 5\' 5"  (1.651 m), weight 221 lb 1.9 oz (100.299 kg). General: Well developed, well nourished, in no acute distress.  Head: Normocephalic, atraumatic, sclera non-icteric, mucus membranes are moist,   Neck: Supple. Carotids are 2 + without bruits. No JVD  Lungs: Clear bilaterally to auscultation.  Heart: Irregularly irregular.  normal  S1 S2. He is tachycardic. He has a soft systolic murmur.  Abdomen: Soft, non-tender, non-distended with normal bowel sounds. No hepatomegaly. No rebound/guarding. No masses.  Msk:  Strength and tone are normal  Extremities: No clubbing or cyanosis. No edema.  Distal pedal pulses are 2+ and equal bilaterally.  Neuro: Alert and oriented X 3. Moves all extremities spontaneously.  Psych:  Responds to questions appropriately with a normal affect.  ECG: 11/08/2011-atrial fibrillation with a rapid ventricular response of 125. He has nonspecific ST and T wave changes.  Assessment / Plan:

## 2011-11-08 NOTE — Assessment & Plan Note (Signed)
Mr. Jacob [name] is a 56 year old gentleman who is seen here today for the first time. He was recently in the hospital for congestive heart failure, attention, atrial fibrillation.  His echocardiogram showed  Left ventricle: Diffuse hypokinesis worse in the infero basal wall The cavity size was moderately dilated. Wall thickness was increased in a pattern of mild LVH. Systolic function was severely reduced. The estimated ejection fraction was in the range of 25% to 30%. Diffuse hypokinesis. - Left atrium: The atrium was moderately dilated. - Right atrium: The atrium was mildly dilated. - Atrial septum: No defect or patent foramen ovale was identified. - Pulmonary arteries: PA peak pressure: 47mm Hg (S). - Pericardium, extracardiac: A trivial pericardial effusion was identified posterior to the heart.  His heart rate is high today. He was previously on breath no but is clearly not on the medication now. He doesn't have a bottle. We will increase carvedilol to 12.5 mg a day which will help with rate control. I suspect that the carvedilol will help more than the verapamil anyway.  He has a history of congestive heart failure and diabetes and hypertension. He has a CHADS score of 3. At this point I'm not sure that he is compliant enough to start him on Coumadin. He's left the hospital AMA. There's been some noncompliance with medications. I would like to make sure that he is compliant with return visits before we start him on Coumadin.  We talked about starting him on blood thinners.  He informed me that he needs to have all of his teeth pulled. We will have him go to the dentist and get his teeth pulled and then we'll talk about Coumadin at his next visit. This will give [place] time to make sure that he is compliant and that he  understands the importance of followup visits.

## 2011-11-08 NOTE — Assessment & Plan Note (Signed)
He should continue with his current occasions. He does not have a doctor to follow him at this time. Will try to get him followed at the hospital where one of the other clinics.

## 2011-11-11 ENCOUNTER — Other Ambulatory Visit: Payer: Self-pay | Admitting: *Deleted

## 2011-11-11 NOTE — Telephone Encounter (Signed)
Opened in Error.

## 2011-11-11 NOTE — Addendum Note (Signed)
Addended by: Andrey Cota A on: 11/11/2011 01:33 PM   Modules accepted: Orders

## 2011-11-24 DIAGNOSIS — I2510 Atherosclerotic heart disease of native coronary artery without angina pectoris: Secondary | ICD-10-CM

## 2011-11-24 HISTORY — DX: Atherosclerotic heart disease of native coronary artery without angina pectoris: I25.10

## 2011-11-24 HISTORY — PX: CORONARY ANGIOPLASTY WITH STENT PLACEMENT: SHX49

## 2011-11-28 ENCOUNTER — Telehealth: Payer: Self-pay | Admitting: Cardiovascular Disease

## 2011-11-28 NOTE — Telephone Encounter (Signed)
Pt was notified that Dr Elease Hashimoto cleared him for his tooth extractions when he was last in the office.  Dr Naaman Plummer, the dentist was also notified.

## 2011-11-28 NOTE — Telephone Encounter (Signed)
Pt has tooth extraction with dr Naaman Plummer tomorrow and his office needs a call from [place] @ [phone], pt [phone] when done

## 2011-12-01 ENCOUNTER — Emergency Department (HOSPITAL_COMMUNITY): Payer: Medicaid Other

## 2011-12-01 ENCOUNTER — Encounter (HOSPITAL_COMMUNITY): Payer: Self-pay | Admitting: *Deleted

## 2011-12-01 ENCOUNTER — Inpatient Hospital Stay (HOSPITAL_COMMUNITY)
Admission: EM | Admit: 2011-12-01 | Discharge: 2011-12-05 | DRG: 286 | Disposition: A | Discharge status: Home / Self Care | Payer: Medicaid Other | Attending: Internal Medicine | Admitting: Internal Medicine

## 2011-12-01 DIAGNOSIS — I4891 Unspecified atrial fibrillation: Principal | ICD-10-CM | POA: Diagnosis present

## 2011-12-01 DIAGNOSIS — Z7901 Long term (current) use of anticoagulants: Secondary | ICD-10-CM

## 2011-12-01 DIAGNOSIS — I10 Essential (primary) hypertension: Secondary | ICD-10-CM | POA: Diagnosis present

## 2011-12-01 DIAGNOSIS — M7989 Other specified soft tissue disorders: Secondary | ICD-10-CM

## 2011-12-01 DIAGNOSIS — I482 Chronic atrial fibrillation, unspecified: Secondary | ICD-10-CM | POA: Diagnosis present

## 2011-12-01 DIAGNOSIS — I5042 Chronic combined systolic (congestive) and diastolic (congestive) heart failure: Secondary | ICD-10-CM | POA: Diagnosis present

## 2011-12-01 DIAGNOSIS — I5023 Acute on chronic systolic (congestive) heart failure: Secondary | ICD-10-CM | POA: Diagnosis present

## 2011-12-01 DIAGNOSIS — I129 Hypertensive chronic kidney disease with stage 1 through stage 4 chronic kidney disease, or unspecified chronic kidney disease: Secondary | ICD-10-CM | POA: Diagnosis not present

## 2011-12-01 DIAGNOSIS — I5021 Acute systolic (congestive) heart failure: Secondary | ICD-10-CM

## 2011-12-01 DIAGNOSIS — I2510 Atherosclerotic heart disease of native coronary artery without angina pectoris: Secondary | ICD-10-CM

## 2011-12-01 DIAGNOSIS — Z9119 Patient's noncompliance with other medical treatment and regimen: Secondary | ICD-10-CM

## 2011-12-01 DIAGNOSIS — J189 Pneumonia, unspecified organism: Secondary | ICD-10-CM

## 2011-12-01 DIAGNOSIS — Z91199 Patient's noncompliance with other medical treatment and regimen due to unspecified reason: Secondary | ICD-10-CM

## 2011-12-01 DIAGNOSIS — E119 Type 2 diabetes mellitus without complications: Secondary | ICD-10-CM | POA: Diagnosis present

## 2011-12-01 DIAGNOSIS — I319 Disease of pericardium, unspecified: Secondary | ICD-10-CM

## 2011-12-01 DIAGNOSIS — I509 Heart failure, unspecified: Secondary | ICD-10-CM | POA: Diagnosis present

## 2011-12-01 DIAGNOSIS — I5022 Chronic systolic (congestive) heart failure: Secondary | ICD-10-CM

## 2011-12-01 DIAGNOSIS — I2789 Other specified pulmonary heart diseases: Secondary | ICD-10-CM | POA: Diagnosis present

## 2011-12-01 DIAGNOSIS — E876 Hypokalemia: Secondary | ICD-10-CM | POA: Diagnosis present

## 2011-12-01 DIAGNOSIS — N189 Chronic kidney disease, unspecified: Secondary | ICD-10-CM | POA: Diagnosis not present

## 2011-12-01 LAB — CBC WITH DIFFERENTIAL/PLATELET
Basophils Absolute: 0 10*3/uL (ref 0.0–0.1)
Basophils Relative: 0 % (ref 0–1)
HCT: 38.9 % — ABNORMAL LOW (ref 39.0–52.0)
Hemoglobin: 12.3 g/dL — ABNORMAL LOW (ref 13.0–17.0)
Lymphocytes Relative: 20 % (ref 12–46)
Monocytes Absolute: 0.7 10*3/uL (ref 0.1–1.0)
Neutro Abs: 5.1 10*3/uL (ref 1.7–7.7)
Neutrophils Relative %: 68 % (ref 43–77)
RDW: 18.4 % — ABNORMAL HIGH (ref 11.5–15.5)
WBC: 7.5 10*3/uL (ref 4.0–10.5)

## 2011-12-01 LAB — POCT I-STAT, CHEM 8
Chloride: 99 mEq/L (ref 96–112)
Glucose, Bld: 234 mg/dL — ABNORMAL HIGH (ref 70–99)
HCT: 42 % (ref 39.0–52.0)
Hemoglobin: 14.3 g/dL (ref 13.0–17.0)
Potassium: 3.4 mEq/L — ABNORMAL LOW (ref 3.5–5.1)
Sodium: 141 mEq/L (ref 135–145)

## 2011-12-01 LAB — PROTIME-INR
INR: 1.23 (ref 0.00–1.49)
Prothrombin Time: 15.8 seconds — ABNORMAL HIGH (ref 11.6–15.2)

## 2011-12-01 LAB — GLUCOSE, CAPILLARY: Glucose-Capillary: 112 mg/dL — ABNORMAL HIGH (ref 70–99)

## 2011-12-01 LAB — LIPID PANEL
Cholesterol: 114 mg/dL (ref 0–200)
VLDL: 10 mg/dL (ref 0–40)

## 2011-12-01 LAB — MRSA PCR SCREENING: MRSA by PCR: NEGATIVE

## 2011-12-01 LAB — HEMOGLOBIN A1C
Hgb A1c MFr Bld: 7.7 % — ABNORMAL HIGH (ref <5.7)
Mean Plasma Glucose: 174 mg/dL — ABNORMAL HIGH (ref <117)

## 2011-12-01 LAB — POCT I-STAT TROPONIN I: Troponin i, poc: 0.04 ng/mL (ref 0.00–0.08)

## 2011-12-01 LAB — TROPONIN I: Troponin I: <0.3 ng/mL (ref <0.30)

## 2011-12-01 LAB — HEPARIN LEVEL (UNFRACTIONATED): Heparin Unfractionated: <0.1 IU/mL — ABNORMAL LOW (ref 0.30–0.70)

## 2011-12-01 MED ORDER — LISINOPRIL 20 MG PO TABS
20.0000 mg | ORAL_TABLET | Freq: Every day | ORAL | Status: DC
  Administered 2011-12-01 – 2011-12-03 (×2): 20 mg via ORAL
  Filled 2011-12-01 (×5): qty 1

## 2011-12-01 MED ORDER — HEPARIN BOLUS VIA INFUSION
3000.0000 [IU] | Freq: Once | INTRAVENOUS | Status: AC
  Administered 2011-12-01: 3000 [IU] via INTRAVENOUS
  Filled 2011-12-01: qty 3000

## 2011-12-01 MED ORDER — POLYETHYLENE GLYCOL 3350 17 G PO PACK
17.0000 g | PACK | Freq: Every day | ORAL | Status: DC | PRN
  Filled 2011-12-01: qty 1

## 2011-12-01 MED ORDER — SODIUM CHLORIDE 0.9 % IV SOLN: INTRAVENOUS | Status: DC

## 2011-12-01 MED ORDER — ASPIRIN 81 MG PO CHEW
81.0000 mg | CHEWABLE_TABLET | Freq: Every day | ORAL | Status: DC
  Administered 2011-12-03 – 2011-12-05 (×3): 81 mg via ORAL
  Filled 2011-12-01 (×3): qty 1

## 2011-12-01 MED ORDER — DIAZEPAM 5 MG PO TABS
5.0000 mg | ORAL_TABLET | ORAL | Status: AC
  Administered 2011-12-02: 5 mg via ORAL
  Filled 2011-12-01: qty 1

## 2011-12-01 MED ORDER — SODIUM CHLORIDE 0.9 % IJ SOLN: 3.0000 mL | INTRAMUSCULAR | Status: DC | PRN

## 2011-12-01 MED ORDER — ACETAMINOPHEN 325 MG PO TABS: 650.0000 mg | ORAL_TABLET | Freq: Four times a day (QID) | ORAL | Status: DC | PRN

## 2011-12-01 MED ORDER — CARVEDILOL 12.5 MG PO TABS
12.5000 mg | ORAL_TABLET | Freq: Two times a day (BID) | ORAL | Status: DC
  Administered 2011-12-01 – 2011-12-03 (×4): 12.5 mg via ORAL
  Filled 2011-12-01 (×6): qty 1

## 2011-12-01 MED ORDER — CARVEDILOL 25 MG PO TABS
25.0000 mg | ORAL_TABLET | Freq: Two times a day (BID) | ORAL | Status: DC
  Administered 2011-12-01: 25 mg via ORAL
  Filled 2011-12-01 (×3): qty 1

## 2011-12-01 MED ORDER — HEPARIN (PORCINE) IN NACL 100-0.45 UNIT/ML-% IJ SOLN
2000.0000 [IU]/h | INTRAMUSCULAR | Status: DC
  Administered 2011-12-01 – 2011-12-02 (×2): 2000 [IU]/h via INTRAVENOUS
  Filled 2011-12-01 (×5): qty 250

## 2011-12-01 MED ORDER — ASPIRIN 81 MG PO CHEW
324.0000 mg | CHEWABLE_TABLET | ORAL | Status: AC
  Administered 2011-12-02: 324 mg via ORAL

## 2011-12-01 MED ORDER — SODIUM CHLORIDE 0.9 % IJ SOLN: 3.0000 mL | Freq: Two times a day (BID) | INTRAMUSCULAR | Status: DC

## 2011-12-01 MED ORDER — INSULIN ASPART 100 UNIT/ML ~~LOC~~ SOLN
0.0000 [IU] | Freq: Three times a day (TID) | SUBCUTANEOUS | Status: DC
  Administered 2011-12-03: 1 [IU] via SUBCUTANEOUS
  Administered 2011-12-04: 5 [IU] via SUBCUTANEOUS

## 2011-12-01 MED ORDER — SODIUM CHLORIDE 0.9 % IV SOLN: 250.0000 mL | INTRAVENOUS | Status: DC | PRN

## 2011-12-01 MED ORDER — ONDANSETRON HCL 4 MG/2ML IJ SOLN: 4.0000 mg | Freq: Four times a day (QID) | INTRAMUSCULAR | Status: DC | PRN

## 2011-12-01 MED ORDER — HEPARIN (PORCINE) IN NACL 100-0.45 UNIT/ML-% IJ SOLN
1400.0000 [IU]/h | INTRAMUSCULAR | Status: DC
  Administered 2011-12-01: 1400 [IU]/h via INTRAVENOUS
  Filled 2011-12-01 (×2): qty 250

## 2011-12-01 MED ORDER — POTASSIUM CHLORIDE 20 MEQ/15ML (10%) PO LIQD
20.0000 meq | Freq: Every day | ORAL | Status: DC
  Administered 2011-12-01: 20 meq via ORAL
  Filled 2011-12-01: qty 15

## 2011-12-01 MED ORDER — POTASSIUM CHLORIDE CRYS ER 20 MEQ PO TBCR
40.0000 meq | EXTENDED_RELEASE_TABLET | Freq: Two times a day (BID) | ORAL | Status: AC
  Administered 2011-12-01 (×2): 40 meq via ORAL
  Filled 2011-12-01 (×3): qty 2

## 2011-12-01 MED ORDER — FUROSEMIDE 10 MG/ML IJ SOLN
40.0000 mg | Freq: Two times a day (BID) | INTRAMUSCULAR | Status: DC
  Administered 2011-12-01 – 2011-12-04 (×7): 40 mg via INTRAVENOUS
  Filled 2011-12-01 (×11): qty 4

## 2011-12-01 MED ORDER — ONDANSETRON HCL 4 MG PO TABS: 4.0000 mg | ORAL_TABLET | Freq: Four times a day (QID) | ORAL | Status: DC | PRN

## 2011-12-01 MED ORDER — ASPIRIN 81 MG PO CHEW
81.0000 mg | CHEWABLE_TABLET | Freq: Every day | ORAL | Status: DC
  Administered 2011-12-01: 81 mg via ORAL
  Filled 2011-12-01: qty 1

## 2011-12-01 MED ORDER — HEPARIN BOLUS VIA INFUSION
4000.0000 [IU] | Freq: Once | INTRAVENOUS | Status: AC
  Administered 2011-12-01: 4000 [IU] via INTRAVENOUS

## 2011-12-01 MED ORDER — ASPIRIN 81 MG PO TABS: 81.0000 mg | ORAL_TABLET | Freq: Every day | ORAL | Status: DC

## 2011-12-01 MED ORDER — ALUM & MAG HYDROXIDE-SIMETH 200-200-20 MG/5ML PO SUSP: 30.0000 mL | Freq: Four times a day (QID) | ORAL | Status: DC | PRN

## 2011-12-01 MED ORDER — METOPROLOL TARTRATE 1 MG/ML IV SOLN: 2.5000 mg | Freq: Four times a day (QID) | INTRAVENOUS | Status: DC | PRN

## 2011-12-01 MED ORDER — ACETAMINOPHEN 650 MG RE SUPP: 650.0000 mg | Freq: Four times a day (QID) | RECTAL | Status: DC | PRN

## 2011-12-01 MED ORDER — SPIRONOLACTONE 25 MG PO TABS
25.0000 mg | ORAL_TABLET | Freq: Every day | ORAL | Status: DC
  Administered 2011-12-01 – 2011-12-03 (×2): 25 mg via ORAL
  Filled 2011-12-01 (×5): qty 1

## 2011-12-01 NOTE — Progress Notes (Signed)
VASCULAR LAB PRELIMINARY  PRELIMINARY  PRELIMINARY  PRELIMINARY  Bilateral lower extremity venous Dopplers completed.    Preliminary report:  There is no DVT or SVT noted in the bilateral lower extremities.  The veins are pulsatile, consistent with fluid overload.  Dayleen Beske, 12/01/2011, 11:50 AM

## 2011-12-01 NOTE — ED Notes (Signed)
Report called to RN.  Doctor notified to put in Potassium order.

## 2011-12-01 NOTE — Consult Note (Signed)
CARDIOLOGY CONSULT NOTE   Patient ID: Jacob [name] MRN: [id number] DOB/AGE: 1955/09/06 56 y.o.  Admit date: 12/01/2011  Primary Physician   No primary provider on file. Primary Cardiologist   Nahser Reason for Consultation   LVD, CHF - Need for cath  Jacob [name] is a 56 y.o. male with no a history of CAD. He has a history of cardiomyopathy with an EF of 50% in 2010 when he was admitted for CHF and hypertensive urgency. His EF had decreased to 25-30% by July 2013 when he was again admitted for CHF, but also had atrial fibrillation (left AMA). He was seen in the office - initial visit - by Dr Elease Hashimoto 11/08/2011. He was on Coreg which was increased for rate control and was on Lasix and lisinopril. Compliance with meds was a concern and he told Dr Elease Hashimoto he needed his teeth pulled, so he was not started on coumadin (CHADS score of 3). The plan was for the patient to get his teeth pulled and follow up in the office, with consideration of coumadin at that time.   Jacob [name] reports ongoing DOE since he saw Dr Elease Hashimoto. He does not know how much he weighs, thinks he lost weight but also thinks he put on fluid. He describes orthopnea and PND. He finally came in last night because the SOB got so bad.  He never gets chest pain. He is not aware of a rapid or irregular heart rate, never gets palpitations.    Past Medical History  Diagnosis Date  . Diabetes mellitus   . Hypertension    Past Surgical History  Procedure Date  . None    No Known Allergies  I have reviewed the patient's current medications    . aspirin  81 mg Oral Daily  . carvedilol  25 mg Oral BID WC  . furosemide  40 mg Intravenous Q12H  . heparin  4,000 Units Intravenous Once  . insulin aspart  0-9 Units Subcutaneous TID WC  . lisinopril  20 mg Oral Daily  . potassium chloride  20 mEq Oral Daily  . sodium chloride  3 mL Intravenous Q12H  . spironolactone  25 mg Oral Daily  . DISCONTD: aspirin  81 mg Oral Daily      .  heparin 1,400 Units/hr (12/01/11 0625)   acetaminophen, acetaminophen, alum & mag hydroxide-simeth, metoprolol, ondansetron (ZOFRAN) IV, ondansetron, polyethylene glycol  Medication Sig  aspirin 81 MG tablet Take 81 mg by mouth daily.  carvedilol (COREG) 12.5 MG tablet Take 1 tablet (12.5 mg total) by mouth 2 times daily with a meal.  furosemide (LASIX) 40 MG tablet Take 40 mg by mouth daily.  glipiZIDE (GLUCOTROL) 5 MG tablet Take 5 mg by mouth daily.  lisinopril  20 MG tablet Take 20 mg by mouth daily.  spironolactone  25 MG tablet Take 1 tablet (25 mg total) by mouth daily.   History   Social History  . Marital Status: Married    Spouse Name: N/A    Number of Children: N/A  . Years of Education: N/A   Occupational History  . unemployed    Social History Main Topics  . Smoking status: Current Everyday Smoker -- 0.5 packs/day  . Smokeless tobacco: Never Used   Comment: Stopped 10-08-11  . Alcohol Use: No  . Drug Use: No  . Sexually Active: No   Other Topics Concern  . Not on file   Social History Narrative   Neither his parents nor any  siblings have any cardiac issues.   Family Status  Relation Status Death Age  . Mother Alive 22s    No CAD  . Father Alive 68s    No CAD    ROS:  Full 14 point review of systems complete and found to be negative unless listed above.  Physical Exam: Blood pressure 116/91, pulse 102, temperature 97.8 F (36.6 C), temperature source Oral, resp. rate 23, height 5' 4.96" (1.65 m), weight 221 lb 1.9 oz (100.3 kg), SpO2 100.00%.  General: Well developed, well nourished, male in no acute distress Head: Eyes PERRLA, No xanthomas.   Normocephalic and atraumatic, oropharynx without edema or exudate. Dentition Lungs: bilateral  Heart: HRRR S1 S2, no rub/gallop, Heart irregular rate and rhythm with S1, S2  murmur. pulses are 2+ extrem.   Neck: No carotid bruits. No lymphadenopathy.  JVD. Abdomen: Bowel sounds present, abdomen soft and  non-tender without masses or hernias noted. Msk:  No spine or cva tenderness. No weakness, no joint deformities or effusions. Extremities: No clubbing or cyanosis.  edema.  Neuro: Alert and oriented X 3. No focal deficits noted. Psych:  Good affect, responds appropriately Skin: No rashes or lesions noted.  Labs:   Lab Results  Component Value Date   WBC 7.5 12/01/2011   HGB 14.3 12/01/2011   HCT 42.0 12/01/2011   MCV 81.4 12/01/2011   PLT 165 12/01/2011    Basename 12/01/11 0532  INR 1.23    Lab 12/01/11 0348  NA 141  K 3.4*  CL 99  CO2 --  BUN 28*  CREATININE 1.20  CALCIUM --  PROT --  BILITOT --  ALKPHOS --  ALT --  AST --  GLUCOSE 234*    Basename 12/01/11 0347  TROPIPOC 0.04   Pro B Natriuretic peptide (BNP)  Date/Time Value Range Status  12/01/2011  3:33 AM 2960.0* 0 - 125 pg/mL Final  10/01/2011  2:30 PM 3806.0* 0 - 125 pg/mL Final   Lab Results  Component Value Date   CHOL 114 12/01/2011   HDL 33* 12/01/2011   LDLCALC 71 12/01/2011   TRIG 51 12/01/2011   TSH  Date/Time Value Range Status  10/01/2011  4:55 PM 1.519  0.350 - 4.500 uIU/mL Final   Echo: results pending currently, 25-30% in July 2013  ECG: Atrial fib, rate 91, inferior T wave flattening, left axis deviation   Radiology:  Dg Chest 2 View 12/01/2011  *RADIOLOGY REPORT*  Clinical Data: Shortness of breath and leg swelling.  CHEST - 2 VIEW  Comparison: 10/01/2011.  Findings: Cardiac enlargement with pulmonary vascular congestion. Bilateral pleural effusions, greater on the right.  Atelectasis in the lung bases.  Mild interstitial pattern without airspace edema. No focal consolidation.  No pneumothorax.  Mediastinal contours appear intact.  IMPRESSION: Cardiac enlargement with pulmonary vascular congestion and bilateral pleural effusions.   Original Report Authenticated By: Marlon Pel, M.D.     ASSESSMENT AND PLAN:   The patient was seen today by Dr Johney Frame, the patient evaluated and the data reviewed.    ?CAD - He denies any ischemic symptoms but his EF was low and this is his second admission for CHF in 3 months. He states was taking his meds PTA (pill count pending by RN). He needs R/L heart cath, resp status should be OK in am. We will put on board and write orders. HR better on increased Coreg, on heparin.  Otherwise, per primary MD. Active Problems:  HYPERTENSION  A-fib  Acute  systolic CHF (congestive heart failure)  DM (diabetes mellitus)  Signed: Theodore Demark 12/01/2011, 7:43 AM Co-Sign MD   I have seen, examined the patient, and reviewed the above assessment and plan.  Changes to above are made where necessary.  The patient has frequent afib with RVR with therapies limited by compliance.  I suspect that his cardiomyopathy may be related to tachycardia, though he has not had an ischemic evaluation previously.  As he presents with SOB, I think that right and left heart catheterization to further evaluate this/ exclude ischemia is necessary.  Risks, benefits, and alternatives to right and left heart catheterization were discussed at length with the patient who wishes to proceed.  Following cath, I would recommend that we initiate coumadin (unless he requires antiplatelet therapy following revascularization) with closely managed INRs in the coumadin clinic.  If he can demonstrate compliance, then we should aggressively pursue sinus rhythm.  In there interim, he will need aggressive rate control with coreg. We will follow while here.   Co Sign: Hillis Range, MD 12/01/2011 10:47 AM

## 2011-12-01 NOTE — Progress Notes (Signed)
ANTICOAGULATION CONSULT NOTE - Initial Consult  Pharmacy Consult for Heparin Indication: atrial fibrillation  No Known Allergies  Patient Measurements: Height: 5' 4.96" (165 cm) Weight: 221 lb 1.9 oz (100.3 kg) IBW/kg (Calculated) : 61.41  Heparin Dosing Weight: 85  Vital Signs: Temp: 98.1 F (36.7 C) (09/08 0328) Temp src: Oral (09/08 0328) BP: 157/90 mmHg (09/08 0328) Pulse Rate: 94  (09/08 0328)  Labs:  Basename 12/01/11 0348 12/01/11 0332  HGB 14.3 12.3*  HCT 42.0 38.9*  PLT -- 165  APTT -- --  LABPROT -- --  INR -- --  HEPARINUNFRC -- --  CREATININE 1.20 --  CKTOTAL -- --  CKMB -- --  TROPONINI -- --    Estimated Creatinine Clearance: 76.6 ml/min (by C-G formula based on Cr of 1.2).   Medical History: Past Medical History  Diagnosis Date  . Diabetes mellitus   . Hypertension     Medications:  ASA  Coreg  Lasix  Glucotrol  Zestril  Aldactone  Assessment: 56 yo male with Afib for Heparin  Goal of Therapy:  Heparin level 0.3-0.7 units/ml Monitor platelets by anticoagulation protocol: Yes   Plan:  Heparin 4000 units IV bolus, then 1400 units/hr Check heparin level in 6 hours.  Eddie Candle 12/01/2011,5:19 AM

## 2011-12-01 NOTE — ED Provider Notes (Signed)
History     CSN: [id number]  Arrival date & time 12/01/11  0243   First MD Initiated Contact with Patient 12/01/11 0301      Chief Complaint  Patient presents with  . Leg Swelling    (Consider location/radiation/quality/duration/timing/severity/associated sxs/prior treatment) Patient is a 56 y.o. male presenting with shortness of breath. The history is provided by the patient.  Shortness of Breath  The current episode started yesterday. The onset was sudden. The problem occurs continuously. The problem has been unchanged. The problem is moderate. Nothing relieves the symptoms. Nothing aggravates the symptoms. Associated symptoms include shortness of breath. Pertinent negatives include no fever, no rhinorrhea, no sore throat, no stridor and no cough. There was no intake of a foreign body. His past medical history does not include asthma.  Peripheral edema x > 1 month.    Past Medical History  Diagnosis Date  . Diabetes mellitus   . Hypertension     History reviewed. No pertinent past surgical history.  History reviewed. No pertinent family history.  History  Substance Use Topics  . Smoking status: Current Everyday Smoker -- 0.5 packs/day  . Smokeless tobacco: Never Used   Comment: Stopped 10-08-11  . Alcohol Use: No      Review of Systems  Constitutional: Negative for fever and diaphoresis.  HENT: Negative for sore throat and rhinorrhea.   Respiratory: Positive for shortness of breath. Negative for cough and stridor.   Cardiovascular: Positive for leg swelling.  All other systems reviewed and are negative.    Allergies  Review of patient's allergies indicates no known allergies.  Home Medications   Current Outpatient Rx  Name Route Sig Dispense Refill  . ASPIRIN 81 MG PO TABS Oral Take 81 mg by mouth daily.    Marland Kitchen CARVEDILOL 12.5 MG PO TABS Oral Take 1 tablet (12.5 mg total) by mouth 2 (two) times daily with a meal. 60 tablet 5  . FUROSEMIDE 40 MG PO TABS Oral  Take 40 mg by mouth daily.    Marland Kitchen GLIPIZIDE 5 MG PO TABS Oral Take 5 mg by mouth daily.    Marland Kitchen LISINOPRIL 20 MG PO TABS Oral Take 20 mg by mouth daily.    Marland Kitchen SPIRONOLACTONE 25 MG PO TABS Oral Take 1 tablet (25 mg total) by mouth daily. 30 tablet 5    BP 157/90  Pulse 94  Temp 98.1 F (36.7 C) (Oral)  Resp 26  SpO2 100%  Physical Exam  Constitutional: He is oriented to person, place, and time. He appears well-developed and well-nourished.  HENT:  Head: Normocephalic and atraumatic.  Mouth/Throat: Oropharynx is clear and moist.  Eyes: Pupils are equal, round, and reactive to light.  Neck: Normal range of motion. Neck supple.  Cardiovascular: An irregularly irregular rhythm present. Tachycardia present.   Pulmonary/Chest: He has decreased breath sounds in the right lower field. He has rales.  Abdominal: Soft. Bowel sounds are normal. There is no tenderness. There is no rebound and no guarding.  Musculoskeletal: He exhibits edema.  Neurological: He is alert and oriented to person, place, and time.  Skin: Skin is warm and dry.  Psychiatric: He has a normal mood and affect.    ED Course  Procedures (including critical care time)  Labs Reviewed  CBC WITH DIFFERENTIAL - Abnormal; Notable for the following:    Hemoglobin 12.3 (*)     HCT 38.9 (*)     MCH 25.7 (*)     RDW 18.4 (*)  All other components within normal limits  PRO B NATRIURETIC PEPTIDE - Abnormal; Notable for the following:    Pro B Natriuretic peptide (BNP) 2960.0 (*)     All other components within normal limits  POCT I-STAT, CHEM 8 - Abnormal; Notable for the following:    Potassium 3.4 (*)     BUN 28 (*)     Glucose, Bld 234 (*)     Calcium, Ion 1.11 (*)     All other components within normal limits  POCT I-STAT TROPONIN I   No results found.   No diagnosis found.    MDM   Date: 12/01/2011  Rate:100   Rhythm: atrial fibrillation  QRS Axis: normal  Intervals: normal  ST/T Wave abnormalities:  nonspecific ST changes  Conduction Disutrbances:none  Narrative Interpretation:   Old EKG Reviewed: changes noted          Bev Drennen Smitty Cords, MD 12/01/11 [phone]

## 2011-12-01 NOTE — Progress Notes (Signed)
ANTICOAGULATION CONSULT NOTE - Initial Consult  Pharmacy Consult for Heparin Indication: atrial fibrillation  No Known Allergies  Patient Measurements: Height: 5' 4.96" (165 cm) Weight: 221 lb 1.9 oz (100.3 kg) IBW/kg (Calculated) : 61.41  Heparin Dosing Weight: 85  Vital Signs: Temp: 97.4 F (36.3 C) (09/08 1500) Temp src: Oral (09/08 1500)  Labs:  Basename 12/01/11 1827 12/01/11 1450 12/01/11 1246 12/01/11 0943 12/01/11 0532 12/01/11 0348 12/01/11 0332  HGB -- -- -- -- -- 14.3 12.3*  HCT -- -- -- -- -- 42.0 38.9*  PLT -- -- -- -- -- -- 165  APTT -- -- -- -- 33 -- --  LABPROT -- -- -- -- 15.8* -- --  INR -- -- -- -- 1.23 -- --  HEPARINUNFRC 0.22* -- <0.10* -- -- -- --  CREATININE -- -- -- -- -- 1.20 --  CKTOTAL -- -- -- -- -- -- --  CKMB -- -- -- -- -- -- --  TROPONINI -- <0.30 -- <0.30 -- -- --    Estimated Creatinine Clearance: 76.6 ml/min (by C-G formula based on Cr of 1.2).  Medications:  ASA  Coreg  Lasix  Glucotrol  Zestril  Aldactone  Assessment: 56 y/o male with Afib receiving Heparin. Heparin level slightly below goal, will inc rate further.  Goal of Therapy:  Heparin level 0.3-0.7 units/ml Monitor platelets by anticoagulation protocol: Yes   Plan:  Increase gtt to 2000 units/hr and f/u daily labs.  Verlene Mayer, PharmD, BCPS Pager [phone] 12/01/2011,7:24 PM

## 2011-12-01 NOTE — Progress Notes (Signed)
Echocardiogram 2D Echocardiogram has been performed.  Sharnika Binney 12/01/2011, 9:19 AM

## 2011-12-01 NOTE — ED Notes (Signed)
Dr. Dewayne Hatch Coreg and Heparin drip started on the floor.

## 2011-12-01 NOTE — H&P (Signed)
[facility]                                                                                    Patient Demographics  Jacob [name], is a 56 y.o. male  CSN: [id number]  MRN: [id number]  DOB - 1955/09/16  Admit Date - 12/01/2011  Outpatient Primary MD for the patient is No primary provider on file.   With History of -  Past Medical History  Diagnosis Date  . Diabetes mellitus   . Hypertension       History reviewed. No pertinent past surgical history.  in for   Chief Complaint  Patient presents with  . Leg Swelling     HPI  Jacob [name]  is a 56 y.o. male, with significant past medical history of care systolic CHF with recent echo in July of this year showing ejection fraction with global hypokinesis with ejection fraction 25-30%, new diagnosis on A. fib, and this mellitus, hypertension, patient presents with complaints of worsening lower extremity edema, and worsening shortness of breath, patient reports he is having been air symptoms are progressing over the last 2 weeks, upon presentation to ED patient was found there to have A. fib with RVR with heart rate in the 110s, patient reports he has been compliant with his medication, is a chest x-ray showing bilateral pleural effusion, patient reports only he can sleep in a sitting up position, and can't tolerate laying supine at all, patient had first set of cardiac enzymes negative, had elevated proBNP, denies any chest pain, recent travel, nausea vomiting, coffee-ground emesis or diaphoresis.    Review of Systems    In addition to the HPI above,  No Fever-chills, No Headache, No changes with Vision or hearing, No problems swallowing food or Liquids, No Chest pain, complaints of worsening Shortness of Breath, No Abdominal pain, No Nausea or Vommitting, Bowel movements are regular, No Blood in stool or Urine, No dysuria, No new skin rashes or bruises, No new joints pains-aches,  No new weakness, tingling,  numbness in any extremity, worsening lower extremity edema No recent weight gain or loss, No polyuria, polydypsia or polyphagia, No significant Mental Stressors.  A full 10 point Review of Systems was done, except as stated above, all other Review of Systems were negative.   Social History Patient reports he quit smoking 3 months ago, denies any alcohol or drug use.   Family History History reviewed. No pertinent family history.   Prior to Admission medications   Medication Sig Start Date End Date Taking? Authorizing Provider  aspirin 81 MG tablet Take 81 mg by mouth daily.   Yes Historical Provider, MD  carvedilol (COREG) 12.5 MG tablet Take 1 tablet (12.5 mg total) by mouth 2 (two) times daily with a meal. 11/08/11  Yes Vesta Mixer, MD  furosemide (LASIX) 40 MG tablet Take 40 mg by mouth daily.   Yes Historical Provider, MD  glipiZIDE (GLUCOTROL) 5 MG tablet Take 5 mg by mouth daily.   Yes Historical Provider, MD  lisinopril (PRINIVIL,ZESTRIL) 20 MG tablet Take 20 mg by mouth daily.   Yes Historical Provider, MD  spironolactone (ALDACTONE) 25 MG tablet  Take 1 tablet (25 mg total) by mouth daily. 11/08/11 11/07/12 Yes Vesta Mixer, MD    No Known Allergies  Physical Exam  Vitals  Blood pressure 140/82, pulse 100, temperature 97.8 F (36.6 C), temperature source Oral, resp. rate 25, height 5' 4.96" (1.65 m), weight 100.3 kg (221 lb 1.9 oz), SpO2 100.00%.   1. General elderly male lying in bed in NAD,    2. Normal affect and insight, Not Suicidal or Homicidal, Awake Alert, Oriented X 3.  3. No F.N deficits, ALL C.Nerves Intact, Strength 5/5 all 4 extremities, Sensation intact all 4 extremities, Plantars down going.  4. Ears and Eyes appear Normal, Conjunctivae clear, PERRLA. Moist Oral Mucosa.  5. Supple Neck, +5 cm JVD, No cervical lymphadenopathy appriciated, No Carotid Bruits.  6. Symmetrical Chest wall movement, decreased air entry at the bases, with bilateral  crakels  7. irregular irregular, tachycardia, No Gallops, Rubs or Murmurs, No Parasternal Heave.  8. Positive Bowel Sounds, Abdomen Soft, Non tender, No organomegaly appriciated,No rebound -guarding or rigidity.  9.  No Cyanosis, Normal Skin Turgor, No Skin Rash or Bruise.  10. Good muscle tone,  joints appear normal , no effusions, Normal ROM. Last 3 bilateral lower extremity edema  11. No Palpable Lymph Nodes in Neck or Axillae    Data Review  CBC  Lab 12/01/11 0348 12/01/11 0332  WBC -- 7.5  HGB 14.3 12.3*  HCT 42.0 38.9*  PLT -- 165  MCV -- 81.4  MCH -- 25.7*  MCHC -- 31.6  RDW -- 18.4*  LYMPHSABS -- 1.5  MONOABS -- 0.7  EOSABS -- 0.2  BASOSABS -- 0.0  BANDABS -- --   ------------------------------------------------------------------------------------------------------------------  Chemistries   Lab 12/01/11 0348  NA 141  K 3.4*  CL 99  CO2 --  GLUCOSE 234*  BUN 28*  CREATININE 1.20  CALCIUM --  MG --  AST --  ALT --  ALKPHOS --  BILITOT --   ------------------------------------------------------------------------------------------------------------------ estimated creatinine clearance is 76.6 ml/min (by C-G formula based on Cr of 1.2). ------------------------------------------------------------------------------------------------------------------ No results found for this basename: TSH,T4TOTAL,FREET3,T3FREE,THYROIDAB in the last 72 hours   Coagulation profile No results found for this basename: INR:5,PROTIME:5 in the last 168 hours ------------------------------------------------------------------------------------------------------------------- No results found for this basename: DDIMER:2 in the last 72 hours -------------------------------------------------------------------------------------------------------------------  Cardiac Enzymes No results found for this basename: CK:3,CKMB:3,TROPONINI:3,MYOGLOBIN:3 in the last 168  hours ------------------------------------------------------------------------------------------------------------------ No components found with this basename: POCBNP:3   ---------------------------------------------------------------------------------------------------------------  Urinalysis    Component Value Date/Time   COLORURINE AMBER* 10/01/2011 1352   APPEARANCEUR CLEAR 10/01/2011 1352   LABSPEC 1.020 10/01/2011 1352   PHURINE 7.0 10/01/2011 1352   GLUCOSEU NEGATIVE 10/01/2011 1352   HGBUR NEGATIVE 10/01/2011 1352   BILIRUBINUR SMALL* 10/01/2011 1352   KETONESUR NEGATIVE 10/01/2011 1352   PROTEINUR >300* 10/01/2011 1352   UROBILINOGEN 2.0* 10/01/2011 1352   NITRITE NEGATIVE 10/01/2011 1352   LEUKOCYTESUR NEGATIVE 10/01/2011 1352    ----------------------------------------------------------------------------------------------------------------    Imaging results:   Dg Chest 2 View  12/01/2011  *RADIOLOGY REPORT*  Clinical Data: Shortness of breath and leg swelling.  CHEST - 2 VIEW  Comparison: 10/01/2011.  Findings: Cardiac enlargement with pulmonary vascular congestion. Bilateral pleural effusions, greater on the right.  Atelectasis in the lung bases.  Mild interstitial pattern without airspace edema. No focal consolidation.  No pneumothorax.  Mediastinal contours appear intact.  IMPRESSION: Cardiac enlargement with pulmonary vascular congestion and bilateral pleural effusions.   Original Report Authenticated By: Marlon Pel,  M.D.    Echo July/2013:  - Left ventricle: Diffuse hypokinesis worse in the infero basal wall The cavity size was moderately dilated. Wall thickness was increased in a pattern of mild LVH. Systolic function was severely reduced. The estimated ejection fraction was in the range of 25% to 30%. Diffuse hypokinesis. - Left atrium: The atrium was moderately dilated. - Right atrium: The atrium was mildly dilated. - Atrial septum: No defect or patent foramen ovale  was identified. - Pulmonary arteries: PA peak pressure: 47mm Hg (S). - Pericardium, extracardiac: A trivial pericardial effusion was identified posterior to the heart.    Assessment & Plan  Active Problems:  A-fib  Acute systolic CHF (congestive heart failure)  HYPERTENSION  DM (diabetes mellitus)    1. lower extremity edema and shortness of breath, this is most likely related to acute CHF exacerbation, but will still obtain by lateral lower extremity venous Doppler to rule out DVT.  2. Acute systolic CHF exacerbation, patient is known to have history of systolic CHF with known ejection fraction of 25-30% with global hypokinesis, already on Aldactone, beta blockers, and aspirin, and ACE inhibitor, has progressive worsening symptoms, bilateral pleural effusion and worsening lower extremity edema, will start on IV Lasix twice a day with strict ins and outs, will consult cardiology service to see if cardiac cath is indicated at this point.  3. A. fib with RVR, will increase patient's home dose Coreg to 25 mg by mouth twice a day, will have him on when necessary metoprolol as well, will start him on heparin drip for anticoagulation, and if no intervention is planned, can be switched to pradxa or xeralto.  4. Diabetes mellitus, will check glycohemoglobin, and we'll have him on insulin sliding scale  5. 2 hypertension, will continue on Coreg and Aldactone and lisinopril.   DVT Prophylaxis is on heparin drip  AM Labs Ordered, also please review Full Orders  Family Communication: Admission, patients condition and plan of care including tests being ordered have been discussed with the patient who indicate understanding and agree with the plan and Code Status.  Code Status FULL  Disposition Plan: home  Time spent in minutes :  Condition GUARDED  Randol Kern, Addysyn Fern M.D on 12/01/2011 at 5:59 AM    [facility] Office  [phone]

## 2011-12-01 NOTE — Progress Notes (Signed)
Monitor in pt room showed pause of 2.05 seconds followed by a pause of 8.45 seconds. Pt awoke upon this rn entering room. Pt without symptoms or complaints. Vitals 113/88 100% 2L o2 Hr91 a fib. PA advised. Will continue to monitor and advise attending as needed.

## 2011-12-01 NOTE — Progress Notes (Signed)
ANTICOAGULATION CONSULT NOTE - Initial Consult  Pharmacy Consult for Heparin Indication: atrial fibrillation  No Known Allergies  Patient Measurements: Height: 5' 4.96" (165 cm) Weight: 221 lb 1.9 oz (100.3 kg) IBW/kg (Calculated) : 61.41  Heparin Dosing Weight: 85  Vital Signs: Temp: 97.5 F (36.4 C) (09/08 1100) Temp src: Oral (09/08 1100) BP: 116/91 mmHg (09/08 0600) Pulse Rate: 102  (09/08 0600)  Labs:  Basename 12/01/11 1246 12/01/11 0943 12/01/11 0532 12/01/11 0348 12/01/11 0332  HGB -- -- -- 14.3 12.3*  HCT -- -- -- 42.0 38.9*  PLT -- -- -- -- 165  APTT -- -- 33 -- --  LABPROT -- -- 15.8* -- --  INR -- -- 1.23 -- --  HEPARINUNFRC <0.10* -- -- -- --  CREATININE -- -- -- 1.20 --  CKTOTAL -- -- -- -- --  CKMB -- -- -- -- --  TROPONINI -- <0.30 -- -- --    Estimated Creatinine Clearance: 76.6 ml/min (by C-G formula based on Cr of 1.2).  Medications:  ASA  Coreg  Lasix  Glucotrol  Zestril  Aldactone  Assessment: 56 yo male with Afib for Heparin. Initial heparin level undetectable. No issues with the infusion noted, no bleeding noted. Will increase rate and rebolus.  Goal of Therapy:  Heparin level 0.3-0.7 units/ml Monitor platelets by anticoagulation protocol: Yes   Plan:  Heparin 3000 units IV bolus, then 1750 units/hr Check heparin level in 6 hours.  Jacob [name] 12/01/2011,1:45 PM

## 2011-12-01 NOTE — Progress Notes (Signed)
Pt had > 8 sec pause and later a > 3 sec pause. Reviewed with Dr Johney Frame, decrease Coreg but do not d/c it, if possible, as he needs it for rate control.

## 2011-12-01 NOTE — Progress Notes (Signed)
[facility] Progress Note [place] TEAM 1 - Stepdown/ICU TEAM   Jacob [name] ZOX:[id number] DOB: Jan 20, 1956 DOA: 12/01/2011 PCP: No primary provider on file.  Brief narrative: 56 y.o. male, with significant past medical history of systolic CHF with recent echo in July of this year showing global hypokinesis with ejection fraction 25-30%, A. fib, DM, hypertension -  patient presented with complaints of worsening lower extremity edema, and worsening shortness of breath - patient reported symptoms had been progressing over the last 2 weeks - upon presentation to ED patient was found to have A. fib with RVR with heart rate in the 110s - patient reported he had been compliant with his medication - a chest x-ray revealed bilateral pleural effusions.  Assessment/Plan:  Atrial fibrillation with rapid ventricular response  Acute systolic congestive heart failure exacerbation in setting of known chronic systolic heart failure Last ejection fraction 25-30% with global hypokinesis July 2030  Diabetes mellitus  Hypertension  Mild hypokalemia  Code Status: Full Disposition Plan:   Consultants: [place] cardiology  Procedures: Transthoracic echocardiogram 12/01/2011>>  Antibiotics: None  DVT prophylaxis: Full anticoagulation with heparin drip  HPI/Subjective: Patient is seen for followup visit   Objective: Blood pressure 116/91, pulse 102, temperature 97.3 F (36.3 C), temperature source Oral, resp. rate 23, height 5' 4.96" (1.65 m), weight 100.3 kg (221 lb 1.9 oz), SpO2 100.00%. No intake or output data in the 24 hours ending 12/01/11 1026   Exam: Patient is seen for followup exam  Data Reviewed: Basic Metabolic Panel:  Lab 12/01/11 8119  NA 141  K 3.4*  CL 99  CO2 --  GLUCOSE 234*  BUN 28*  CREATININE 1.20  CALCIUM --  MG --  PHOS --   CBC:  Lab 12/01/11 0348 12/01/11 0332  WBC -- 7.5  NEUTROABS -- 5.1  HGB 14.3 12.3*  HCT 42.0 38.9*  MCV -- 81.4  PLT  -- 165   BNP (last 3 results)  Basename 12/01/11 0333 10/01/11 1430  PROBNP 2960.0* 3806.0*   CBG:  Lab 12/01/11 0835  GLUCAP 112*    Recent Results (from the past 240 hour(s))  MRSA PCR SCREENING     Status: Normal   Collection Time   12/01/11  5:56 AM      Component Value Range Status Comment   MRSA by PCR NEGATIVE  NEGATIVE Final      Studies:  Recent x-ray studies have been reviewed in detail by the Attending Physician  Scheduled Meds:  Reviewed in detail by the Attending Physician   Lonia Blood, MD [facility] Office  [phone] Pager [phone]  On-Call/Text Page:      [email or web address]      password [email or web address]  If 7PM-7AM, please contact night-coverage [email or web address] Password [email or web address] 12/01/2011, 10:26 AM   LOS: 0 days

## 2011-12-01 NOTE — ED Notes (Signed)
Pt states SOB and leg swelling for the past 2 days. Pt denies hx of CHF. Pt does have bilateral swelling to his feet, and calfs, pt skin color normal and even tone.

## 2011-12-02 ENCOUNTER — Encounter (HOSPITAL_COMMUNITY)
Admission: EM | Disposition: A | Discharge status: Home / Self Care | Payer: Self-pay | Source: Home / Self Care | Attending: Cardiology

## 2011-12-02 DIAGNOSIS — I2510 Atherosclerotic heart disease of native coronary artery without angina pectoris: Secondary | ICD-10-CM

## 2011-12-02 HISTORY — PX: LEFT AND RIGHT HEART CATHETERIZATION WITH CORONARY ANGIOGRAM: SHX5449

## 2011-12-02 LAB — COMPREHENSIVE METABOLIC PANEL
ALT: 17 U/L (ref 0–53)
BUN: 25 mg/dL — ABNORMAL HIGH (ref 6–23)
CO2: 32 mEq/L (ref 19–32)
Calcium: 9.1 mg/dL (ref 8.4–10.5)
GFR calc Af Amer: >90 mL/min (ref >90)
GFR calc non Af Amer: 81 mL/min — ABNORMAL LOW (ref >90)
Glucose, Bld: 111 mg/dL — ABNORMAL HIGH (ref 70–99)
Sodium: 138 mEq/L (ref 135–145)
Total Protein: 7.1 g/dL (ref 6.0–8.3)

## 2011-12-02 LAB — POCT I-STAT 3, VENOUS BLOOD GAS (G3P V)
Bicarbonate: 33.7 mEq/L — ABNORMAL HIGH (ref 20.0–24.0)
O2 Saturation: 56 %
TCO2: 35 mmol/L (ref 0–100)
pCO2, Ven: 55.8 mmHg — ABNORMAL HIGH (ref 45.0–50.0)
pO2, Ven: 30 mmHg (ref 30.0–45.0)

## 2011-12-02 LAB — POCT I-STAT 3, ART BLOOD GAS (G3+)
Acid-Base Excess: 6 mmol/L — ABNORMAL HIGH (ref 0.0–2.0)
Bicarbonate: 31.8 mEq/L — ABNORMAL HIGH (ref 20.0–24.0)
O2 Saturation: 88 %
TCO2: 33 mmol/L (ref 0–100)
pO2, Arterial: 56 mmHg — ABNORMAL LOW (ref 80.0–100.0)

## 2011-12-02 LAB — CBC
Hemoglobin: 13.1 g/dL (ref 13.0–17.0)
MCH: 25.8 pg — ABNORMAL LOW (ref 26.0–34.0)
MCHC: 31.4 g/dL (ref 30.0–36.0)
Platelets: 178 10*3/uL (ref 150–400)
RBC: 5.08 MIL/uL (ref 4.22–5.81)

## 2011-12-02 LAB — GLUCOSE, CAPILLARY
Glucose-Capillary: 134 mg/dL — ABNORMAL HIGH (ref 70–99)
Glucose-Capillary: 120 mg/dL — ABNORMAL HIGH (ref 70–99)
Glucose-Capillary: 121 mg/dL — ABNORMAL HIGH (ref 70–99)

## 2011-12-02 LAB — HEPARIN LEVEL (UNFRACTIONATED): Heparin Unfractionated: 0.35 IU/mL (ref 0.30–0.70)

## 2011-12-02 SURGERY — LEFT AND RIGHT HEART CATHETERIZATION WITH CORONARY ANGIOGRAM
Anesthesia: LOCAL

## 2011-12-02 MED ORDER — WARFARIN SODIUM 7.5 MG PO TABS
7.5000 mg | ORAL_TABLET | Freq: Once | ORAL | Status: AC
  Administered 2011-12-02: 7.5 mg via ORAL
  Filled 2011-12-02: qty 1

## 2011-12-02 MED ORDER — HEPARIN (PORCINE) IN NACL 2-0.9 UNIT/ML-% IJ SOLN
INTRAMUSCULAR | Status: AC
  Filled 2011-12-02: qty 3000

## 2011-12-02 MED ORDER — WARFARIN VIDEO: Freq: Once | Status: DC

## 2011-12-02 MED ORDER — WARFARIN - PHARMACIST DOSING INPATIENT: Freq: Every day | Status: DC

## 2011-12-02 MED ORDER — PATIENT'S GUIDE TO USING COUMADIN BOOK
Freq: Once | Status: AC
  Administered 2011-12-02: 18:00:00
  Filled 2011-12-02: qty 1

## 2011-12-02 MED ORDER — SODIUM CHLORIDE 0.9 % IV SOLN
1.0000 mL/kg/h | INTRAVENOUS | Status: AC
  Administered 2011-12-02: 1 mL/kg/h via INTRAVENOUS

## 2011-12-02 MED ORDER — NITROGLYCERIN 0.2 MG/ML ON CALL CATH LAB
INTRAVENOUS | Status: AC
  Filled 2011-12-02: qty 1

## 2011-12-02 MED ORDER — ATORVASTATIN CALCIUM 80 MG PO TABS
80.0000 mg | ORAL_TABLET | Freq: Every day | ORAL | Status: DC
  Administered 2011-12-02 – 2011-12-04 (×3): 80 mg via ORAL
  Filled 2011-12-02 (×5): qty 1

## 2011-12-02 MED ORDER — LIDOCAINE HCL (PF) 1 % IJ SOLN
INTRAMUSCULAR | Status: AC
  Filled 2011-12-02: qty 30

## 2011-12-02 MED ORDER — HEPARIN (PORCINE) IN NACL 100-0.45 UNIT/ML-% IJ SOLN
2250.0000 [IU]/h | INTRAMUSCULAR | Status: DC
  Administered 2011-12-02: 2000 [IU]/h via INTRAVENOUS
  Administered 2011-12-03: 2200 [IU]/h via INTRAVENOUS
  Administered 2011-12-03: 2500 [IU]/h via INTRAVENOUS
  Administered 2011-12-04: 2250 [IU]/h via INTRAVENOUS
  Filled 2011-12-02 (×8): qty 250

## 2011-12-02 NOTE — Progress Notes (Addendum)
[facility] Progress Note [place] TEAM 1 - Stepdown/ICU TEAM   Jacob [name] JXB:[id number] DOB: [birth year] DOA: 12/01/2011 PCP: No primary provider on file.  Brief narrative: 56 y.o. Male with significant past medical history of systolic CHF with recent echo in July of this year showing global hypokinesis with ejection fraction 25-30%, A. fib, DM, hypertension -  patient presented with complaints of worsening lower extremity edema, and worsening shortness of breath - patient reported symptoms had been progressing over the last 2 weeks - upon presentation to ED patient was found to have A. fib with RVR with heart rate in the 110s - patient reported he had been compliant with his medication - a chest x-ray revealed bilateral pleural effusions.  Assessment/Plan:  Atrial fibrillation with rapid ventricular response (noted first during admit Jul 2013) As per Cardiology - perhaps pt would be best served on the Cardiology service  Acute systolic congestive heart failure exacerbation in setting of known chronic systolic heart failure Last ejection fraction 25-30% with global hypokinesis July 2030 - currently estimated at 15-20% via echo (but in afib at time) - care as per Cardiology  CAD As per Cardiology  Hypertension As per Cardiology  Mild hypokalemia Resolved  Diabetes mellitus On glucotrol + asa + ACE as outpt - CBG reasonably well controlled at present - A1c is elevated at 7.7, likely due to dietary indiscretion - will ask nutrition to educate on diet  Code Status: Full Disposition Plan: Cardiology to determine disposition   Consultants: [place] cardiology  Procedures: Transthoracic echocardiogram 12/01/2011  Antibiotics: None  DVT prophylaxis: Full anticoagulation with heparin drip>>coumadin  HPI/Subjective: Pt is resting comfortably post cath.  He denies cp, sob, f/c, n/v, or abdom pain.     Objective: Blood pressure 142/84, pulse 102, temperature 97.7 F (36.5  C), temperature source Oral, resp. rate 8, height 5' 4.96" (1.65 m), weight 97.8 kg (215 lb 9.8 oz), SpO2 92.00%.  Intake/Output Summary (Last 24 hours) at 12/02/11 1245 Last data filed at 12/02/11 1200  Gross per 24 hour  Intake 484.61 ml  Output   4825 ml  Net -4340.39 ml     Exam: General: No acute respiratory distress at rest  Lungs: Clear to auscultation bilaterally without wheezes or crackles Cardiovascular: Regular rate and rhythm without murmur gallop or rub normal S1 and S2 Abdomen: Nontender, nondistended, soft, bowel sounds positive, no rebound, no ascites, no appreciable mass Extremities: 1+ B LE edema w/o cyanosis or clubbing  Data Reviewed: Basic Metabolic Panel:  Lab 12/02/11 1308 12/01/11 0348  NA 138 141  K 3.9 3.4*  CL 97 99  CO2 32 --  GLUCOSE 111* 234*  BUN 25* 28*  CREATININE 1.02 1.20  CALCIUM 9.1 --  MG -- --  PHOS -- --   CBC:  Lab 12/02/11 0552 12/01/11 0348 12/01/11 0332  WBC 7.8 -- 7.5  NEUTROABS -- -- 5.1  HGB 13.1 14.3 12.3*  HCT 41.7 42.0 38.9*  MCV 82.1 -- 81.4  PLT 178 -- 165   BNP (last 3 results)  Basename 12/01/11 0333 10/01/11 1430  PROBNP 2960.0* 3806.0*   CBG:  Lab 12/02/11 1208 12/02/11 0930 12/01/11 2219 12/01/11 1158 12/01/11 0835  GLUCAP 100* 121* 120* 134* 112*    Recent Results (from the past 240 hour(s))  MRSA PCR SCREENING     Status: Normal   Collection Time   12/01/11  5:56 AM      Component Value Range Status Comment   MRSA by PCR  NEGATIVE  NEGATIVE Final      Studies:  Recent x-ray studies have been reviewed in detail by the Attending Physician  Scheduled Meds:  Reviewed in detail by the Attending Physician   Lonia Blood, MD [facility] Office  [phone] Pager [phone]  On-Call/Text Page:      [email or web address]      password [email or web address]  If 7PM-7AM, please contact night-coverage [email or web address] Password [email or web address] 12/02/2011, 12:45 PM   LOS: 1 day

## 2011-12-02 NOTE — Care Management Note (Addendum)
    Page 1 of 1   12/05/2011     10:28:52 AM   CARE MANAGEMENT NOTE 12/05/2011  Patient:  ABDON, PETROSKY   Account Number:  [id number]  Date Initiated:  12/02/2011  Documentation initiated by:  Junius Creamer  Subjective/Objective Assessment:   adm w heart failure     Action/Plan:   lives w wife   Anticipated DC Date:     Anticipated DC Plan:  HOME/SELF CARE      DC Planning Services  CM consult      Choice offered to / List presented to:             Status of service:  Completed, signed off Medicare Important Message given?   (If response is "NO", the following Medicare IM given date fields will be blank) Date Medicare IM given:   Date Additional Medicare IM given:    Discharge Disposition:  HOME/SELF CARE  Per UR Regulation:  Reviewed for med. necessity/level of care/duration of stay  If discussed at Long Length of Stay Meetings, dates discussed:    Comments:  12-05-11 Tomi Bamberger, RN,BSN [phone] CM spoke to pt and he has a PCP in [place] listed on his medicaid card. Pt is choosing not to drive that far. CM did give pt a Health Connect Number that will assist with finding a PCP closer to pt. CM also gave pt a list of physicians that are taking new pt's. CM did call [facility] to set up f/u  appointment. Pt will be seen in the coumadin clinic on Monday December 09, 2011 at 2:30 pm he will then make an appointment with PA to be seen in the office. Previously seen by MD Nasher. CM relayed this information to [facility] with cardiology. No further neds from CM at this time.    9/9 10:15a debbie dowell rn,bsn [phone]

## 2011-12-02 NOTE — H&P (View-Only) (Signed)
[facility] Progress Note [place] TEAM 1 - Stepdown/ICU TEAM   Jacob [name] MRN:[id number] DOB: 02/02/1956 DOA: 12/01/2011 PCP: No primary provider on file.  Brief narrative: 55 y.o. male, with significant past medical history of systolic CHF with recent echo in July of this year showing global hypokinesis with ejection fraction 25-30%, A. fib, DM, hypertension -  patient presented with complaints of worsening lower extremity edema, and worsening shortness of breath - patient reported symptoms had been progressing over the last 2 weeks - upon presentation to ED patient was found to have A. fib with RVR with heart rate in the 110s - patient reported he had been compliant with his medication - a chest x-ray revealed bilateral pleural effusions.  Assessment/Plan:  Atrial fibrillation with rapid ventricular response  Acute systolic congestive heart failure exacerbation in setting of known chronic systolic heart failure Last ejection fraction 25-30% with global hypokinesis July 2030  Diabetes mellitus  Hypertension  Mild hypokalemia  Code Status: Full Disposition Plan:   Consultants: [place] cardiology  Procedures: Transthoracic echocardiogram 12/01/2011>>  Antibiotics: None  DVT prophylaxis: Full anticoagulation with heparin drip  HPI/Subjective: Patient is seen for followup visit   Objective: Blood pressure 116/91, pulse 102, temperature 97.3 F (36.3 C), temperature source Oral, resp. rate 23, height 5' 4.96" (1.65 m), weight 100.3 kg (221 lb 1.9 oz), SpO2 100.00%. No intake or output data in the 24 hours ending 12/01/11 1026   Exam: Patient is seen for followup exam  Data Reviewed: Basic Metabolic Panel:  Lab 12/01/11 0348  NA 141  K 3.4*  CL 99  CO2 --  GLUCOSE 234*  BUN 28*  CREATININE 1.20  CALCIUM --  MG --  PHOS --   CBC:  Lab 12/01/11 0348 12/01/11 0332  WBC -- 7.5  NEUTROABS -- 5.1  HGB 14.3 12.3*  HCT 42.0 38.9*  MCV -- 81.4  PLT  -- 165   BNP (last 3 results)  Basename 12/01/11 0333 10/01/11 1430  PROBNP 2960.0* 3806.0*   CBG:  Lab 12/01/11 0835  GLUCAP 112*    Recent Results (from the past 240 hour(s))  MRSA PCR SCREENING     Status: Normal   Collection Time   12/01/11  5:56 AM      Component Value Range Status Comment   MRSA by PCR NEGATIVE  NEGATIVE Final      Studies:  Recent x-ray studies have been reviewed in detail by the Attending Physician  Scheduled Meds:  Reviewed in detail by the Attending Physician   Jeanmarc Viernes T. Nalayah Hitt, MD [facility] Office  [phone] Pager [phone]  On-Call/Text Page:      [email or web address]      password [email or web address]  If 7PM-7AM, please contact night-coverage [email or web address] Password [email or web address] 12/01/2011, 10:26 AM   LOS: 0 days       

## 2011-12-02 NOTE — Plan of Care (Signed)
Problem: Limited Adherence to Nutrition-Related Recommendations (NB-1.6) Goal: Nutrition education Formal process to instruct or train a patient/client in a skill or to impart knowledge to help patients/clients voluntarily manage or modify food choices and eating behavior to maintain or improve health.  Outcome: Completed/Met Date Met:  12/02/11 Nutrition Education Note  RD consulted for nutrition education regarding CHF.  RD provided "Low Sodium Nutrition Therapy" handout from the Academy of [employer]etics. Reviewed patient's dietary recall. Provided examples on ways to decrease sodium intake in diet. Discouraged intake of processed foods and use of salt shaker. Encouraged fresh fruits and vegetables as well as whole grain sources of carbohydrates to maximize fiber intake.   RD discussed why it is important for patient to adhere to diet recommendations, and emphasized the role of fluids, foods to avoid, and importance of weighing self daily.   RD also consulted for nutrition education regarding diabetes.     Lab Results  Component Value Date    HGBA1C 7.7* 12/01/2011    RD provided "Carbohydrate Counting for People with Diabetes" handout from the Academy of Nutrition and Dietetics. Discussed different food groups and their effects on blood sugar, emphasizing carbohydrate-containing foods. Provided list of carbohydrates and recommended serving sizes of common foods.  Discussed importance of controlled and consistent carbohydrate intake throughout the day. Provided examples of ways to balance meals/snacks and encouraged intake of high-fiber, whole grain complex carbohydrates.  Pt is vegetarian, mostly eats breads and crackers with various spreads. Carbohydrates are the primary kcal source in this pt's diet, encouraged low fat dairy for increased protein and decreased carbohydrate intake. Pt with likely poor protein intake. Does limit sugar(desserts)  and use alternatives when able, but  does not avoid salt. Encouraged pt to stop using salt to season foods, encouraged pt to discuss use of salt alternatives with MD.  Pt seems unlikely to make major changes to his dietary intake.   Expect poor compliance.  Body mass index is 35.92 kg/(m^2). Pt meets criteria for obesity class 2 based on current BMI.  Current diet order is clear liquids, no meals documented at this time. Labs and medications reviewed. No further nutrition interventions warranted at this time. RD contact information provided. If additional nutrition issues arise, please re-consult RD.  Clarene Duke RD, LDN Pager [phone] After Hours pager [phone]

## 2011-12-02 NOTE — Progress Notes (Signed)
Pt had 7 beat run Vtach. VSS, asymptomatic. Dr. Swaziland made aware. No new orders at this time. Will continue to monitor closely.

## 2011-12-02 NOTE — CV Procedure (Signed)
   Cardiac Catheterization Procedure Note  Name: Jacob [name] MRN: [id number] DOB: 1955-04-27  Procedure: Right Heart Cath, Left Heart Cath, Selective Coronary Angiography  Indication: 56 year old [place] male presents with congestive heart failure. Echocardiogram demonstrates an ejection fraction of 15%. He also has permanent atrial fibrillation.   Procedural Details: The right groin was prepped, draped, and anesthetized with 1% lidocaine. Using the modified Seldinger technique a 5 French sheath was placed in the right femoral artery and a 7 French sheath was placed in the right femoral vein. A Swan-Ganz catheter was used for the right heart catheterization. Standard protocol was followed for recording of right heart pressures and sampling of oxygen saturations. Fick cardiac output was calculated. Standard Judkins catheters were used for selective coronary angiography and left ventricular pressure measurement. There were no immediate procedural complications. The patient was transferred to the post catheterization recovery area for further monitoring.  Procedural Findings: Hemodynamics RA 16/17 with a mean of 15 mmHg RV 53/16 mmHg PA 54/30 with a mean of 42 mmHg PCWP 26/28 with a mean of 23 mmHg LV 125/23 mmHg AO 123/82 with a mean of 99 mmHg  Oxygen saturations: PA 56% AO 88%  Cardiac Output (Fick) 4.76 L per minute  Cardiac Index (Fick) 2.33 L per minute per meter square   Coronary angiography: Coronary dominance: Left  Left mainstem: Mild irregularities up to 20%.  Left anterior descending (LAD): The LAD is a large vessel that extends around the apex. It has diffuse irregularities throughout up to 20-30% in the proximal and mid vessel. The distal vessel has 40% disease. The first and second diagonal branches are small in caliber. They each have 60-70% disease proximally.  Left circumflex (LCx): The left circumflex coronary is a large dominant vessel. It has 30-40% narrowing at  the ostium. The first marginal branch has 30% disease proximally. There is 30% disease prior to the PDA.  Right coronary artery (RCA): The right coronary is a small nondominant vessel. 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 There is left to right collaterals to the distal vessel.  Left ventriculography: Not performed.  Final Conclusions:   1. 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 Patient does have diffuse nonobstructive disease. 2. Moderate pulmonary hypertension with elevated left ventricular filling pressures.   Recommendations: Continue aggressive medical management of his congestive heart failure. We will add statin therapy. We'll resume anticoagulation with heparin and then transition to Coumadin orally for his atrial fibrillation.   Theron Arista [facility] 12/02/2011, 9:11 AM

## 2011-12-02 NOTE — Interval H&P Note (Signed)
History and Physical Interval Note:  12/02/2011 8:27 AM  Jacob [name]  has presented today for surgery, with the diagnosis of cp  The various methods of treatment have been discussed with the patient and family. After consideration of risks, benefits and other options for treatment, the patient has consented to  Procedure(s) (LRB) with comments: LEFT AND RIGHT HEART CATHETERIZATION WITH CORONARY ANGIOGRAM (N/A) as a surgical intervention .  The patient's history has been reviewed, patient examined, no change in status, stable for surgery.  I have reviewed the patient's chart and labs.  Questions were answered to the patient's satisfaction.     Theron Arista [facility] 12/02/2011 8:27 AM

## 2011-12-02 NOTE — Progress Notes (Signed)
ANTICOAGULATION CONSULT NOTE - Initial Consult  Pharmacy Consult for Heparin>>warfarin Indication: atrial fibrillation  Patient Measurements: Height: 5' 4.96" (165 cm) Weight: 215 lb 9.8 oz (97.8 kg) IBW/kg (Calculated) : 61.41  Heparin Dosing Weight: 85  Vital Signs: Temp: 97.5 F (36.4 C) (09/09 0754) Temp src: Oral (09/09 0754) BP: 128/96 mmHg (09/09 0754) Pulse Rate: 90  (09/09 0828)  Labs:  Basename 12/02/11 0552 12/01/11 1827 12/01/11 1450 12/01/11 1246 12/01/11 0943 12/01/11 0532 12/01/11 0348 12/01/11 0332  HGB 13.1 -- -- -- -- -- 14.3 --  HCT 41.7 -- -- -- -- -- 42.0 38.9*  PLT 178 -- -- -- -- -- -- 165  APTT -- -- -- -- -- 33 -- --  LABPROT -- -- -- -- -- 15.8* -- --  INR -- -- -- -- -- 1.23 -- --  HEPARINUNFRC 0.35 0.22* -- <0.10* -- -- -- --  CREATININE 1.02 -- -- -- -- -- 1.20 --  CKTOTAL -- -- -- -- -- -- -- --  CKMB -- -- -- -- -- -- -- --  TROPONINI -- -- <0.30 -- <0.30 -- -- --    Estimated Creatinine Clearance: 89 ml/min (by C-G formula based on Cr of 1.02).  Medications:  ASA  Coreg  Lasix  Glucotrol  Zestril  Aldactone  Assessment: 56 y/o male with Afib receiving Heparin. Heparin level now at goal this morning. Patient is s/p cath found to have single vessel CAD involving nondominant right coronary. Plan is to continue aggressive medical management and restart anticoagulation for afib tonight. CHADS2 score of 3, warfarin therapy had been deferred in the past due to needing dental surgery.  Goal of Therapy:  Heparin level 0.3-0.7 units/ml Monitor platelets by anticoagulation protocol: Yes INR goal 2-3   Plan:  Restart heparin gtt at 2000 units/hr and f/u daily labs. Warfarin 7.5mg  tonight INR daily  Sheppard Coil, PharmD, BCPS Pager [phone] 12/02/2011,10:54 AM

## 2011-12-02 NOTE — Progress Notes (Signed)
A 3.11 sec pause noted in cardiac rhythm. Pt asymptomatic Dr Swaziland notified no further orders

## 2011-12-03 DIAGNOSIS — Z9119 Patient's noncompliance with other medical treatment and regimen: Secondary | ICD-10-CM

## 2011-12-03 DIAGNOSIS — E119 Type 2 diabetes mellitus without complications: Secondary | ICD-10-CM

## 2011-12-03 DIAGNOSIS — I10 Essential (primary) hypertension: Secondary | ICD-10-CM

## 2011-12-03 DIAGNOSIS — I4891 Unspecified atrial fibrillation: Principal | ICD-10-CM

## 2011-12-03 DIAGNOSIS — I5023 Acute on chronic systolic (congestive) heart failure: Secondary | ICD-10-CM

## 2011-12-03 LAB — CBC
HCT: 43.1 % (ref 39.0–52.0)
Hemoglobin: 13.6 g/dL (ref 13.0–17.0)
MCH: 25.8 pg — ABNORMAL LOW (ref 26.0–34.0)
MCV: 81.6 fL (ref 78.0–100.0)
RBC: 5.28 MIL/uL (ref 4.22–5.81)
WBC: 6.2 10*3/uL (ref 4.0–10.5)

## 2011-12-03 LAB — GLUCOSE, CAPILLARY
Glucose-Capillary: 229 mg/dL — ABNORMAL HIGH (ref 70–99)
Glucose-Capillary: 119 mg/dL — ABNORMAL HIGH (ref 70–99)
Glucose-Capillary: 202 mg/dL — ABNORMAL HIGH (ref 70–99)

## 2011-12-03 LAB — PROTIME-INR
INR: 1.43 (ref 0.00–1.49)
Prothrombin Time: 17.7 seconds — ABNORMAL HIGH (ref 11.6–15.2)

## 2011-12-03 LAB — HEPARIN LEVEL (UNFRACTIONATED): Heparin Unfractionated: 0.69 IU/mL (ref 0.30–0.70)

## 2011-12-03 MED ORDER — HEPARIN BOLUS VIA INFUSION
2500.0000 [IU] | Freq: Once | INTRAVENOUS | Status: AC
  Administered 2011-12-03: 2500 [IU] via INTRAVENOUS
  Filled 2011-12-03: qty 2500

## 2011-12-03 MED ORDER — CARVEDILOL 25 MG PO TABS
25.0000 mg | ORAL_TABLET | Freq: Two times a day (BID) | ORAL | Status: DC
  Administered 2011-12-03 – 2011-12-05 (×3): 25 mg via ORAL
  Filled 2011-12-03 (×7): qty 1

## 2011-12-03 MED ORDER — WARFARIN SODIUM 7.5 MG PO TABS
7.5000 mg | ORAL_TABLET | Freq: Once | ORAL | Status: AC
  Administered 2011-12-03: 7.5 mg via ORAL
  Filled 2011-12-03 (×2): qty 1

## 2011-12-03 NOTE — Progress Notes (Signed)
ANTICOAGULATION CONSULT NOTE   Pharmacy Consult for Heparin Indication: atrial fibrillation  Patient Measurements: Height: 5' 4.96" (165 cm) Weight: 215 lb 9.8 oz (97.8 kg) IBW/kg (Calculated) : 61.41  Heparin Dosing Weight: 85  Vital Signs: Temp: 98.4 F (36.9 C) (09/10 0000) Temp src: Oral (09/10 0000) BP: 138/76 mmHg (09/10 0000) Pulse Rate: 100  (09/10 0000)  Labs:  Basename 12/03/11 0011 12/02/11 0552 12/01/11 1827 12/01/11 1450 12/01/11 0943 12/01/11 0532 12/01/11 0348 12/01/11 0332  HGB -- 13.1 -- -- -- -- 14.3 --  HCT -- 41.7 -- -- -- -- 42.0 38.9*  PLT -- 178 -- -- -- -- -- 165  APTT -- -- -- -- -- 33 -- --  LABPROT -- -- -- -- -- 15.8* -- --  INR -- -- -- -- -- 1.23 -- --  HEPARINUNFRC 0.14* 0.35 0.22* -- -- -- -- --  CREATININE -- 1.02 -- -- -- -- 1.20 --  CKTOTAL -- -- -- -- -- -- -- --  CKMB -- -- -- -- -- -- -- --  TROPONINI -- -- -- <0.30 <0.30 -- -- --    Estimated Creatinine Clearance: 89 ml/min (by C-G formula based on Cr of 1.02).  Assessment: 56 y/o male s/p cath, h/o Afib for Heparin.   Goal of Therapy:  Heparin level 0.3-0.7 units/ml Monitor platelets by anticoagulation protocol: Yes INR goal 2-3   Plan: Increase Heparin 2200 units/hr Follow-up am labs.  Geannie Risen, PharmD, BCPS  12/03/2011,12:50 AM

## 2011-12-03 NOTE — Progress Notes (Signed)
ANTICOAGULATION CONSULT NOTE - Follow Up Consult  Pharmacy Consult for Heparin and Coumadin Indication: atrial fibrillation  No Known Allergies  Patient Measurements: Height: 5' 4.96" (165 cm) Weight: 211 lb 6.7 oz (95.9 kg) IBW/kg (Calculated) : 61.41  Heparin Dosing Weight: 82.5 kg  Vital Signs: Temp: 97.8 F (36.6 C) (09/10 1223) Temp src: Oral (09/10 1223) BP: 109/69 mmHg (09/10 1223) Pulse Rate: 93  (09/10 1223)  Labs:  Basename 12/03/11 1707 12/03/11 0929 12/03/11 0011 12/02/11 0552 12/01/11 1450 12/01/11 0943 12/01/11 0532 12/01/11 0348 12/01/11 0332  HGB -- 13.6 -- 13.1 -- -- -- -- --  HCT -- 43.1 -- 41.7 -- -- -- 42.0 --  PLT -- 179 -- 178 -- -- -- -- 165  APTT -- -- -- -- -- -- 33 -- --  LABPROT -- 17.7* -- -- -- -- 15.8* -- --  INR -- 1.43 -- -- -- -- 1.23 -- --  HEPARINUNFRC 0.69 0.11* 0.14* -- -- -- -- -- --  CREATININE -- -- -- 1.02 -- -- -- 1.20 --  CKTOTAL -- -- -- -- -- -- -- -- --  CKMB -- -- -- -- -- -- -- -- --  TROPONINI -- -- -- -- <0.30 <0.30 -- -- --    Estimated Creatinine Clearance: 88.1 ml/min (by C-G formula based on Cr of 1.02).  Assessment:   Heparin level is now therapeutic on 2500 units/hr.   Second dose of Coumadin 7.5 mg to be given tonight.  Goal of Therapy:  INR 2-3 Heparin level 0.3-0.7 units/ml Monitor platelets by anticoagulation protocol: Yes   Plan:   Continue heparin drip at 2500 units/hr.  Next heparin level, PT/INR and CBC in am.  Dennie Fetters, RPh Pager: [phone] 12/03/2011,7:58 PM

## 2011-12-03 NOTE — Progress Notes (Signed)
ANTICOAGULATION CONSULT NOTE - Follow Up Consult  Pharmacy Consult for Heparin and Warfarin  Indication: atrial fibrillation  No Known Allergies  Patient Measurements: Height: 5' 4.96" (165 cm) Weight: 211 lb 6.7 oz (95.9 kg) IBW/kg (Calculated) : 61.41  Heparin Dosing Weight: 82.5kg  Vital Signs: Temp: 97.8 F (36.6 C) (09/10 0835) Temp src: Oral (09/10 0835) BP: 130/95 mmHg (09/10 0815) Pulse Rate: 108  (09/10 0835)  Labs:  Basename 12/03/11 0929 12/03/11 0011 12/02/11 0552 12/01/11 1450 12/01/11 0943 12/01/11 0532 12/01/11 0348 12/01/11 0332  HGB 13.6 -- 13.1 -- -- -- -- --  HCT 43.1 -- 41.7 -- -- -- 42.0 --  PLT 179 -- 178 -- -- -- -- 165  APTT -- -- -- -- -- 33 -- --  LABPROT 17.7* -- -- -- -- 15.8* -- --  INR 1.43 -- -- -- -- 1.23 -- --  HEPARINUNFRC 0.11* 0.14* 0.35 -- -- -- -- --  CREATININE -- -- 1.02 -- -- -- 1.20 --  CKTOTAL -- -- -- -- -- -- -- --  CKMB -- -- -- -- -- -- -- --  TROPONINI -- -- -- <0.30 <0.30 -- -- --    Estimated Creatinine Clearance: 88.1 ml/min (by C-G formula based on Cr of 1.02).   Medications:  Scheduled:    . aspirin  81 mg Oral Daily  . atorvastatin  80 mg Oral q1800  . carvedilol  25 mg Oral BID WC  . furosemide  40 mg Intravenous Q12H  . heparin  2,500 Units Intravenous Once  . insulin aspart  0-9 Units Subcutaneous TID WC  . lisinopril  20 mg Oral Daily  . patient's guide to using coumadin book   Does not apply Once  . potassium chloride  40 mEq Oral BID  . spironolactone  25 mg Oral Daily  . warfarin  7.5 mg Oral ONCE-1800  . warfarin  7.5 mg Oral ONCE-1800  . warfarin   Does not apply Once  . Warfarin - Pharmacist Dosing Inpatient   Does not apply q1800  . DISCONTD: carvedilol  12.5 mg Oral BID WC    Assessment: 56 y/o male s/p cath receiving heparin/warfarin for afib. Rx consulted for anticoagulation: most recent heparin level returned subtherapeutic at 0.11 despite increase in heparin.  INR returned at 1.43 after  one dose of warfarin. Day of 2 of overlap. Patient does have a calculated warfarin score of 8, but given rise in INR overnight will continue with repeat dose of warfarin 7.5mg .  Goal of Therapy:  INR 2-3 Heparin level 0.3-0.7 units/ml Monitor platelets by anticoagulation protocol: Yes   Plan:  -Give bolus heparin 2500 units -Increase continuous heparin 2500 units/hr -Heparin lvl in 6 hrs and monitor H/H and Plt -Warfarin 7.5mg  X 1 tonight -INR daily  Tiney Rouge PharmD. Candidate 12/03/2011,11:18 AM  Patient discussed in detail with the pharmacy student. I have reviewed the patients labs and agree with  the above assessment and plan.   Heparin level low, no bleeding complications noted, will adjust heparin rate. Did see an increase in INR overnight which would not be expected after one dose of warfarin. Will repeat 7.5mg  tonight and follow INR trend with am labs.  Sheppard Coil PharmD. 12/03/2011 12:15 PM

## 2011-12-03 NOTE — Progress Notes (Signed)
[facility] Progress Note [place] TEAM 1 - Stepdown/ICU TEAM   Jakiah Goree GUY:[id number] DOB: 07/11/1955 DOA: 12/01/2011 PCP: No primary provider on file.  Brief narrative: 56 y.o. Male with significant past medical history of systolic CHF with recent echo in July of this year showing global hypokinesis with ejection fraction 25-30%, A. fib, DM, hypertension -  patient presented with complaints of worsening lower extremity edema, and worsening shortness of breath - patient reported symptoms had been progressing over the last 2 weeks - upon presentation to ED patient was found to have A. fib with RVR with heart rate in the 110s - patient reported he had been compliant with his medication - a chest x-ray revealed bilateral pleural effusions.  Assessment/Plan:  Atrial fibrillation with rapid ventricular response (noted first during admit Jul 2013) As per Cardiology  Acute systolic congestive heart failure exacerbation in setting of known chronic systolic heart failure Last ejection fraction 25-30% with global hypokinesis July 2030 - currently estimated at 15-20% via echo (but in afib at time) - care as per Cardiology  CAD As per Cardiology  Hypertension As per Cardiology  Mild hypokalemia Resolved  Diabetes mellitus On glucotrol + asa + ACE as outpt - CBG reasonably well controlled at present - A1c is elevated at 7.7, likely due to dietary indiscretion  Code Status: Full Disposition Plan: Cardiology to determine disposition   Consultants: [place] cardiology  Procedures: Transthoracic echocardiogram 12/01/2011  Antibiotics: None  DVT prophylaxis: Full anticoagulation with heparin drip>>coumadin  HPI/Subjective: Pt notes that ankle edema has improved. No complaints of dyspnea.    Objective: Blood pressure 109/69, pulse 93, temperature 97.8 F (36.6 C), temperature source Oral, resp. rate 18, height 5' 4.96" (1.65 m), weight 95.9 kg (211 lb 6.7 oz), SpO2  100.00%.  Intake/Output Summary (Last 24 hours) at 12/03/11 2006 Last data filed at 12/03/11 1000  Gross per 24 hour  Intake 681.63 ml  Output   2450 ml  Net -1768.37 ml     Exam: General: No acute respiratory distress at rest  Lungs: Clear to auscultation bilaterally without wheezes or crackles Cardiovascular: Regular rate and rhythm without murmur gallop or rub normal S1 and S2 Abdomen: Nontender, nondistended, soft, bowel sounds positive, no rebound, no ascites, no appreciable mass Extremities: 1+ B LE edema w/o cyanosis or clubbing  Data Reviewed: Basic Metabolic Panel:  Lab 12/02/11 5638 12/01/11 0348  NA 138 141  K 3.9 3.4*  CL 97 99  CO2 32 --  GLUCOSE 111* 234*  BUN 25* 28*  CREATININE 1.02 1.20  CALCIUM 9.1 --  MG -- --  PHOS -- --   CBC:  Lab 12/03/11 0929 12/02/11 0552 12/01/11 0348 12/01/11 0332  WBC 6.2 7.8 -- 7.5  NEUTROABS -- -- -- 5.1  HGB 13.6 13.1 14.3 12.3*  HCT 43.1 41.7 42.0 38.9*  MCV 81.6 82.1 -- 81.4  PLT 179 178 -- 165   BNP (last 3 results)  Basename 12/01/11 0333 10/01/11 1430  PROBNP 2960.0* 3806.0*   CBG:  Lab 12/03/11 1654 12/03/11 1148 12/03/11 0837 12/02/11 2128 12/02/11 1734  GLUCAP 119* 229* 132* 187* 100*    Recent Results (from the past 240 hour(s))  MRSA PCR SCREENING     Status: Normal   Collection Time   12/01/11  5:56 AM      Component Value Range Status Comment   MRSA by PCR NEGATIVE  NEGATIVE Final      Studies:  Recent x-ray studies have been reviewed  in detail by the Attending Physician  Scheduled Meds:  Reviewed in detail by the Attending Physician   Calvert Cantor, MD [phone]  If 7PM-7AM, please contact night-coverage [email or web address] Password [email or web address] 12/03/2011, 8:06 PM   LOS: 2 days

## 2011-12-03 NOTE — Progress Notes (Signed)
TELEMETRY: Reviewed telemetry pt in Afib. Resting HR 100 bpm, increases to 125-140 with exertion. 3.1 sec pause noted yesterday.: Filed Vitals:   12/03/11 0300 12/03/11 0400 12/03/11 0815 12/03/11 0835  BP:  114/85 130/95   Pulse:  102  108  Temp:  98.4 F (36.9 C)  97.8 F (36.6 C)  TempSrc:  Oral  Oral  Resp:  20    Height:      Weight: 95.9 kg (211 lb 6.7 oz)     SpO2:  96%  96%    Intake/Output Summary (Last 24 hours) at 12/03/11 0946 Last data filed at 12/03/11 0700  Gross per 24 hour  Intake 1193.36 ml  Output   2600 ml  Net -1406.64 ml    SUBJECTIVE Feels well. Wants to go home. Denies SOB or chest pain.  LABS: Basic Metabolic Panel:  Basename 12/02/11 0552 12/01/11 0348  NA 138 141  K 3.9 3.4*  CL 97 99  CO2 32 --  GLUCOSE 111* 234*  BUN 25* 28*  CREATININE 1.02 1.20  CALCIUM 9.1 --  MG -- --  PHOS -- --   Liver Function Tests:  [facility] 12/02/11 0552  AST 15  ALT 17  ALKPHOS 168*  BILITOT 1.0  PROT 7.1  ALBUMIN 3.3*   No results found for this basename: LIPASE:2,AMYLASE:2 in the last 72 hours CBC:  Basename 12/02/11 0552 12/01/11 0348 12/01/11 0332  WBC 7.8 -- 7.5  NEUTROABS -- -- 5.1  HGB 13.1 14.3 --  HCT 41.7 42.0 --  MCV 82.1 -- 81.4  PLT 178 -- 165   Cardiac Enzymes:  Basename 12/01/11 1450 12/01/11 0943  CKTOTAL -- --  CKMB -- --  CKMBINDEX -- --  TROPONINI <0.30 <0.30   Hemoglobin A1C:  Basename 12/01/11 0516  HGBA1C 7.7*   Fasting Lipid Panel:  Basename 12/01/11 0532  CHOL 114  HDL 33*  LDLCALC 71  TRIG 51  CHOLHDL 3.5  LDLDIRECT --    Radiology/Studies:  Dg Chest 2 View  12/01/2011  *RADIOLOGY REPORT*  Clinical Data: Shortness of breath and leg swelling.  CHEST - 2 VIEW  Comparison: 10/01/2011.  Findings: Cardiac enlargement with pulmonary vascular congestion. Bilateral pleural effusions, greater on the right.  Atelectasis in the lung bases.  Mild interstitial pattern without airspace edema. No focal  consolidation.  No pneumothorax.  Mediastinal contours appear intact.  IMPRESSION: Cardiac enlargement with pulmonary vascular congestion and bilateral pleural effusions.   Original Report Authenticated By: Marlon Pel, M.D.     PHYSICAL EXAM General: Well developed, well nourished, in no acute distress. Head: Normocephalic, atraumatic, sclera non-icteric, no xanthomas, nares are without discharge. Neck: Negative for carotid bruits. JVD not elevated. Lungs: Clear bilaterally to auscultation without wheezes, rales, or rhonchi. Breathing is unlabored. Heart: IRRR S1 S2 without murmurs, rubs, or gallops.  Abdomen: Soft, non-tender, non-distended with normoactive bowel sounds. No hepatomegaly. No rebound/guarding. No obvious abdominal masses. Msk:  Strength and tone appears normal for age. Extremities: 1+ edema.  Distal pedal pulses are 2+ and equal bilaterally. Neuro: Alert and oriented X 3. Moves all extremities spontaneously. Psych:  Responds to questions appropriately with a normal affect.  ASSESSMENT AND PLAN: 1. Acute on chronic systolic CHF, EF 16-10%. Nonischemic. On carvedilol, lisinopril, aldactone. Continue IV lasix. Still volume overloaded based on cath and exam. Sodium restriction. Will transfer to floor today.  2. Afib with RVR. Rate poorly controlled. Previously on higher dose of coreg patient had pauses that were asymptomatic.  He clearly needs better rate control. Will increase Coreg to 25 mg bid. If he has significant pauses he may need a pacemaker.   3. Anticoagulation. On IV heparin. Transitioning to oral coumadin.  4. 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 Medical Rx.  5. HTN controlled.  6. DM. On glypizide and SSI.  Active Problems:  HYPERTENSION  A-fib  Acute on chronic systolic heart failure  DM (diabetes mellitus)  Noncompliance    Signed, Peter [place] MD,FACC 12/03/2011 9:53 AM

## 2011-12-04 DIAGNOSIS — I2510 Atherosclerotic heart disease of native coronary artery without angina pectoris: Secondary | ICD-10-CM | POA: Diagnosis present

## 2011-12-04 LAB — CBC
MCV: 80.7 fL (ref 78.0–100.0)
Platelets: 187 10*3/uL (ref 150–400)
RBC: 5.23 MIL/uL (ref 4.22–5.81)
RDW: 18.4 % — ABNORMAL HIGH (ref 11.5–15.5)
WBC: 6.6 10*3/uL (ref 4.0–10.5)

## 2011-12-04 LAB — BASIC METABOLIC PANEL
CO2: 34 mEq/L — ABNORMAL HIGH (ref 19–32)
Chloride: 96 mEq/L (ref 96–112)
Creatinine, Ser: 1.23 mg/dL (ref 0.50–1.35)
GFR calc Af Amer: 75 mL/min — ABNORMAL LOW (ref >90)
Sodium: 138 mEq/L (ref 135–145)

## 2011-12-04 LAB — GLUCOSE, CAPILLARY
Glucose-Capillary: 272 mg/dL — ABNORMAL HIGH (ref 70–99)
Glucose-Capillary: 129 mg/dL — ABNORMAL HIGH (ref 70–99)

## 2011-12-04 LAB — PROTIME-INR: Prothrombin Time: 18.4 seconds — ABNORMAL HIGH (ref 11.6–15.2)

## 2011-12-04 LAB — HEPARIN LEVEL (UNFRACTIONATED): Heparin Unfractionated: 0.85 IU/mL — ABNORMAL HIGH (ref 0.30–0.70)

## 2011-12-04 MED ORDER — ENOXAPARIN (LOVENOX) PATIENT EDUCATION KIT
PACK | Freq: Once | Status: AC
  Administered 2011-12-04: 13:00:00
  Filled 2011-12-04: qty 1

## 2011-12-04 MED ORDER — ENOXAPARIN SODIUM 100 MG/ML ~~LOC~~ SOLN
1.0000 mg/kg | Freq: Two times a day (BID) | SUBCUTANEOUS | Status: DC
  Administered 2011-12-04 – 2011-12-05 (×2): 95 mg via SUBCUTANEOUS
  Filled 2011-12-04 (×4): qty 1

## 2011-12-04 MED ORDER — FUROSEMIDE 40 MG PO TABS
40.0000 mg | ORAL_TABLET | Freq: Two times a day (BID) | ORAL | Status: DC
  Administered 2011-12-04 – 2011-12-05 (×2): 40 mg via ORAL
  Filled 2011-12-04 (×4): qty 1

## 2011-12-04 MED ORDER — WARFARIN SODIUM 7.5 MG PO TABS
7.5000 mg | ORAL_TABLET | Freq: Once | ORAL | Status: AC
  Administered 2011-12-04: 7.5 mg via ORAL
  Filled 2011-12-04: qty 1

## 2011-12-04 MED ORDER — POTASSIUM CHLORIDE CRYS ER 20 MEQ PO TBCR
40.0000 meq | EXTENDED_RELEASE_TABLET | Freq: Once | ORAL | Status: AC
  Administered 2011-12-04: 40 meq via ORAL
  Filled 2011-12-04: qty 2

## 2011-12-04 NOTE — Progress Notes (Signed)
pts BP 88/62, pt asymptomatic; Theodore Demark, PA paged and made aware; told to hold on lisinopril and aldactone; will continue to monitor

## 2011-12-04 NOTE — Progress Notes (Addendum)
[facility] PROGRESS NOTE  Jacob [name] MWN:[id number] DOB: 07-21-1955 DOA: 12/01/2011 PCP: No primary provider on file.  Assessment/Plan: Active Problems:  HYPERTENSION  A-fib  Acute on chronic systolic heart failure  DM (diabetes mellitus)  Noncompliance  Coronary artery disease  Warfarin anticoagulation  Assessment/Plan:  Atrial fibrillation with rapid ventricular response (noted first during admit Jul 2013), improving rate control and anticoagulated. As per Cardiology  Acute systolic congestive heart failure exacerbation in setting of known chronic systolic heart failure Last ejection fraction 25-30% with global hypokinesis July 2030 - currently estimated at 15-20% via echo (but in afib at time) - care as per Cardiology -  Patient is on beta blocker, ACEI (will hold tomorrow if creatinine rising), aldactone and lasix.  CAD s/p catheterization 9/9 showing occlusion of small RCA with collaterals.  Creatinine mildly elevated today As per Cardiology -  On statin, aspirin, beta-blocker - Trend creatinine.    Hypertension with mild hypotension today.  BP medications held. As per Cardiology  Mild hypokalemia Resolved  Diabetes mellitus, CBG 119-202 over last 24 hours On glucotrol + asa + ACE as outpt - CBG reasonably well controlled at present - A1c is elevated at 7.7, likely due to dietary indiscretion   Code Status: Full Family Communication: Patient very much wanted to go home today, however, he does not have any primary care physician with whom to follow up.  He states that he can easily get in to see any doctor because he has medicaid, however, he has no particularly clinic in mind.  He stays in [place], but is planning to go to [place] after discharge. Disposition Plan:  Pending BP stable and further evaluation of rise of creatinine (trend).  He will need CHF and anticoagulation follow up.    Brief narrative: 56 y.o. Male with significant past medical history  of systolic CHF with recent echo in July of this year showing global hypokinesis with ejection fraction 25-30%, A. fib, DM, hypertension -  patient presented with complaints of worsening lower extremity edema, and worsening shortness of breath - patient reported symptoms had been progressing over the last 2 weeks - upon presentation to ED patient was found to have A. fib with RVR with heart rate in the 110s - patient reported he had been compliant with his medication - a chest x-ray revealed bilateral pleural effusions.   Consultants:  Cardiology  Procedures: ECHO 9/8 Catheterization 9/9  Antibiotics:  None  HPI/Subjective:  Patient states that he feels much better today and would like to go home.  Denies chest pain, shortness of breath.  Improved sleep and lower extremity edema.    Objective: Filed Vitals:   12/04/11 0953 12/04/11 1400 12/04/11 1620 12/04/11 1641  BP: 88/60 104/68 110/66   Pulse:  93  103  Temp:  97.2 F (36.2 C)    TempSrc:      Resp:  18    Height:      Weight:      SpO2:  95%      Intake/Output Summary (Last 24 hours) at 12/04/11 2027 Last data filed at 12/04/11 1100  Gross per 24 hour  Intake      0 ml  Output    700 ml  Net   -700 ml   Filed Weights   12/02/11 0500 12/03/11 0300 12/04/11 0540  Weight: 97.8 kg (215 lb 9.8 oz) 95.9 kg (211 lb 6.7 oz) 93.849 kg (206 lb 14.4 oz)    Exam:   General:  No acute distress  HEENT:  PERRL, anicteric, nares and OP clear, MMM  NECK;  Supple  CV:  Irregularly irregular (not tachycardic), no murmurs  PULM:  CTAB  ABD:  Soft, nondistended, nontender  EXT:  1+ bilateral lower extremity edema  Data Reviewed: Basic Metabolic Panel:  Lab 12/04/11 4098 12/04/11 0510 12/02/11 0552 12/01/11 0348  NA -- 138 138 141  K -- 3.6 3.9 3.4*  CL -- 96 97 99  CO2 -- 34* 32 --  GLUCOSE -- 110* 111* 234*  BUN -- 27* 25* 28*  CREATININE -- 1.23 1.02 1.20  CALCIUM -- 9.3 9.1 --  MG 1.9 -- -- --  PHOS -- --  -- --   Liver Function Tests:  Lab 12/02/11 0552  AST 15  ALT 17  ALKPHOS 168*  BILITOT 1.0  PROT 7.1  ALBUMIN 3.3*   No results found for this basename: LIPASE:5,AMYLASE:5 in the last 168 hours No results found for this basename: AMMONIA:5 in the last 168 hours CBC:  Lab 12/04/11 0510 12/03/11 0929 12/02/11 0552 12/01/11 0348 12/01/11 0332  WBC 6.6 6.2 7.8 -- 7.5  NEUTROABS -- -- -- -- 5.1  HGB 13.4 13.6 13.1 14.3 12.3*  HCT 42.2 43.1 41.7 42.0 38.9*  MCV 80.7 81.6 82.1 -- 81.4  PLT 187 179 178 -- 165   Cardiac Enzymes:  Lab 12/01/11 1450 12/01/11 0943  CKTOTAL -- --  CKMB -- --  CKMBINDEX -- --  TROPONINI <0.30 <0.30   BNP (last 3 results)  Basename 12/01/11 0333 10/01/11 1430  PROBNP 2960.0* 3806.0*   CBG:  Lab 12/04/11 1630 12/04/11 1111 12/04/11 0724 12/03/11 2041 12/03/11 1654  GLUCAP 129* 272* 121* 202* 119*    Recent Results (from the past 240 hour(s))  MRSA PCR SCREENING     Status: Normal   Collection Time   12/01/11  5:56 AM      Component Value Range Status Comment   MRSA by PCR NEGATIVE  NEGATIVE Final      Studies: No results found.  Scheduled Meds:   . aspirin  81 mg Oral Daily  . atorvastatin  80 mg Oral q1800  . carvedilol  25 mg Oral BID WC  . enoxaparin (LOVENOX) injection  1 mg/kg Subcutaneous Q12H  . enoxaparin   Does not apply Once  . furosemide  40 mg Oral q12n4p  . insulin aspart  0-9 Units Subcutaneous TID WC  . lisinopril  20 mg Oral Daily  . potassium chloride  40 mEq Oral Once  . spironolactone  25 mg Oral Daily  . warfarin  7.5 mg Oral ONCE-1800  . warfarin  7.5 mg Oral ONCE-1800  . warfarin   Does not apply Once  . Warfarin - Pharmacist Dosing Inpatient   Does not apply q1800  . DISCONTD: furosemide  40 mg Intravenous Q12H   Continuous Infusions:   . sodium chloride 10 mL/hr at 12/02/11 1900  . DISCONTD: heparin 2,250 Units/hr (12/04/11 0802)    Active Problems:  HYPERTENSION  A-fib  Acute on chronic systolic  heart failure  DM (diabetes mellitus)  Noncompliance  Coronary artery disease  Warfarin anticoagulation    Time spent: 30    Jacob [name], [facility]  [facility] Pager [phone]. If 8PM-8AM, please contact night-coverage at [email or web address], password [facility] 12/04/2011, 8:27 PM  LOS: 3 days

## 2011-12-04 NOTE — Progress Notes (Signed)
Inpatient Diabetes Program Recommendations  AACE/ADA: New Consensus Statement on Inpatient Glycemic Control (2013)  Target Ranges:  Prepandial:   less than 140 mg/dL      Peak postprandial:   less than 180 mg/dL (1-2 hours)      Critically ill patients:  140 - 180 mg/dL   Reason for Visit: Results for AHREN, PETTINGER (MRN [id number]) as of 12/04/2011 14:35  Ref. Range 12/03/2011 16:54 12/03/2011 20:41 12/04/2011 07:24 12/04/2011 11:11  Glucose-Capillary Latest Range: 70-99 mg/dL 621 (H) 308 (H) 657 (H) 272 (H)   Please consider adding Novolog meal coverage 3 units tid with meals (hold if patient eats less than 50%).  Will follow.

## 2011-12-04 NOTE — Progress Notes (Signed)
Patient ID: Jacob [name], male   DOB: [birth year], 56 y.o.   MRN: [id number]   SUBJECTIVE: Patient underwent cardiac catheterization yesterday. There will be medical therapy of his coronary disease. He needs to have his CHF stabilized further before discharge home. His atrial fib rate continues to vary. He's not had any marked pauses since yesterday. His input and output is not well record from yesterday. He remains on IV Lasix.   Filed Vitals:   12/03/11 0835 12/03/11 1223 12/03/11 2100 12/04/11 0540  BP:  109/69 96/62 108/62  Pulse: 108 93 106 100  Temp: 97.8 F (36.6 C) 97.8 F (36.6 C) 98.1 F (36.7 C) 97.6 F (36.4 C)  TempSrc: Oral Oral    Resp:  18 18 18   Height:      Weight:    206 lb 14.4 oz (93.849 kg)  SpO2: 96% 100% 90% 100%    Intake/Output Summary (Last 24 hours) at 12/04/11 0904 Last data filed at 12/03/11 1000  Gross per 24 hour  Intake    272 ml  Output      0 ml  Net    272 ml    LABS: Basic Metabolic Panel:  Basename 12/04/11 0510 12/02/11 0552  NA 138 138  K 3.6 3.9  CL 96 97  CO2 34* 32  GLUCOSE 110* 111*  BUN 27* 25*  CREATININE 1.23 1.02  CALCIUM 9.3 9.1  MG -- --  PHOS -- --   Liver Function Tests:  Basename 12/02/11 0552  AST 15  ALT 17  ALKPHOS 168*  BILITOT 1.0  PROT 7.1  ALBUMIN 3.3*   No results found for this basename: LIPASE:2,AMYLASE:2 in the last 72 hours CBC:  Basename 12/04/11 0510 12/03/11 0929  WBC 6.6 6.2  NEUTROABS -- --  HGB 13.4 13.6  HCT 42.2 43.1  MCV 80.7 81.6  PLT 187 179   Cardiac Enzymes:  Basename 12/01/11 1450 12/01/11 0943  CKTOTAL -- --  CKMB -- --  CKMBINDEX -- --  TROPONINI <0.30 <0.30   BNP: No components found with this basename: POCBNP:3 D-Dimer: No results found for this basename: DDIMER:2 in the last 72 hours Hemoglobin A1C: No results found for this basename: HGBA1C in the last 72 hours Fasting Lipid Panel: No results found for this basename: CHOL,HDL,LDLCALC,TRIG,CHOLHDL,LDLDIRECT  in the last 72 hours Thyroid Function Tests: No results found for this basename: TSH,T4TOTAL,FREET3,T3FREE,THYROIDAB in the last 72 hours  RADIOLOGY: Dg Chest 2 View  12/01/2011  *RADIOLOGY REPORT*  Clinical Data: Shortness of breath and leg swelling.  CHEST - 2 VIEW  Comparison: 10/01/2011.  Findings: Cardiac enlargement with pulmonary vascular congestion. Bilateral pleural effusions, greater on the right.  Atelectasis in the lung bases.  Mild interstitial pattern without airspace edema. No focal consolidation.  No pneumothorax.  Mediastinal contours appear intact.  IMPRESSION: Cardiac enlargement with pulmonary vascular congestion and bilateral pleural effusions.   Original Report Authenticated By: Marlon Pel, M.D.     PHYSICAL EXAM   Patient is stable. His cath site in his right groin is stable. He's feeling much better. There is no jugulovenous distention. Lung exam reveals scattered rhonchi. Cardiac exam reveals S1 and S2. The rhythm is irregularly irregular. The abdomen is soft. He still has 1+ peripheral edema.   TELEMETRY: I have reviewed telemetry today December 04, 2011. There is atrial fibrillation. His rate does increase when he gets up and around. There've not been any marked pauses.   ASSESSMENT AND PLAN:   HYPERTENSION  Blood pressures controlled. No change in therapy today.   A-fib   Patient continues to have atrial fib. He does have some increase rates at times. I'm hesitant to push his meds any further as of today.   Acute on chronic systolic heart failure     Patient has improved significantly. He is still on IV diuretics. His creatinine is up slightly today. I will change him to by mouth diuretics and followup his renal function tomorrow. If the patient is stable overall tomorrow consideration could be given to discharge.   DM (diabetes mellitus)  Noncompliance   Coronary artery disease   Coronary disease is stable by catheterization. No further workup.    Warfarin anticoagulation   Patient is on heparin and Coumadin is being managed by pharmacy.  Renal insufficiency  Creatinine is up slightly today. This is the day after his catheterization. We do not have any good eye and nose from yesterday. Renal function will need to be followed tomorrow.   Willa Rough 12/04/2011 9:04 AM

## 2011-12-04 NOTE — Progress Notes (Signed)
ANTICOAGULATION CONSULT NOTE - Follow Up Consult  Pharmacy Consult for Heparin --> Lovenox and Warfarin  Indication: atrial fibrillation  No Known Allergies  Patient Measurements: Height: 5' 4.96" (165 cm) Weight: 206 lb 14.4 oz (93.849 kg) IBW/kg (Calculated) : 61.41  Heparin Dosing Weight: 82.5kg  Vital Signs: Temp: 97.6 F (36.4 C) (09/11 0540) BP: 88/60 mmHg (09/11 0953) Pulse Rate: 100  (09/11 0540)  Labs:  Basename 12/04/11 1320 12/04/11 0510 12/03/11 1707 12/03/11 0929 12/02/11 0552 12/01/11 1450  HGB -- 13.4 -- 13.6 -- --  HCT -- 42.2 -- 43.1 41.7 --  PLT -- 187 -- 179 178 --  APTT -- -- -- -- -- --  LABPROT -- 18.4* -- 17.7* -- --  INR -- 1.50* -- 1.43 -- --  HEPARINUNFRC 0.93* 0.85* 0.69 -- -- --  CREATININE -- 1.23 -- -- 1.02 --  CKTOTAL -- -- -- -- -- --  CKMB -- -- -- -- -- --  TROPONINI -- -- -- -- -- <0.30    Estimated Creatinine Clearance: 72.2 ml/min (by C-G formula based on Cr of 1.23).  Assessment: 56 y/o male s/p cath on 9/10 receiving heparin/warfarin for afib to transition to Lovenox/Warfarin in preparation for discharge. Heparin level remains elevated (0.93) despite decrease in infusion rate. Pt weighs 93.8 kg, CrCl ~72.2 ml/min. CBC stable from yesterday. No bleeding reported per RN.    Goal of Therapy:  INR 2-3 Xa levels 0.6-1.2 units/ml 4-hours after dose Monitor platelets by anticoagulation protocol: Yes   Plan:  Discontinue heparin infusion, discontinue heparin levels Lovenox 95 mg SQ q12h - Start Lovenox one hour after heparin infusion turned off (~1600) Follow up AM labs  Crist Fat L  12/04/2011,2:13 PM

## 2011-12-04 NOTE — Progress Notes (Signed)
ANTICOAGULATION CONSULT NOTE - Follow Up Consult  Pharmacy Consult for Heparin and Warfarin  Indication: atrial fibrillation  No Known Allergies  Patient Measurements: Height: 5' 4.96" (165 cm) Weight: 206 lb 14.4 oz (93.849 kg) IBW/kg (Calculated) : 61.41  Heparin Dosing Weight: 82.5kg  Vital Signs: Temp: 97.6 F (36.4 C) (09/11 0540) BP: 108/62 mmHg (09/11 0540) Pulse Rate: 100  (09/11 0540)  Labs:  Basename 12/04/11 0510 12/03/11 1707 12/03/11 0929 12/02/11 0552 12/01/11 1450 12/01/11 0943  HGB 13.4 -- 13.6 -- -- --  HCT 42.2 -- 43.1 41.7 -- --  PLT 187 -- 179 178 -- --  APTT -- -- -- -- -- --  LABPROT 18.4* -- 17.7* -- -- --  INR 1.50* -- 1.43 -- -- --  HEPARINUNFRC 0.85* 0.69 0.11* -- -- --  CREATININE 1.23 -- -- 1.02 -- --  CKTOTAL -- -- -- -- -- --  CKMB -- -- -- -- -- --  TROPONINI -- -- -- -- <0.30 <0.30    Estimated Creatinine Clearance: 72.2 ml/min (by C-G formula based on Cr of 1.23).  Assessment: 56 y/o male with afib/CHF for Heparin and Coumadin   Goal of Therapy:  INR 2-3 Heparin level 0.3-0.7 units/ml Monitor platelets by anticoagulation protocol: Yes   Plan:  Decrease Heparin 2250 units/hr Check heparin level in 8 hours. Coumadin 7.5 mg today  Eddie Candle  12/04/2011,6:56 AM

## 2011-12-04 NOTE — Progress Notes (Signed)
Reviewed levonex injections with pt; pt able to tell me where, how, and is able to demonstrate how to give himself injection

## 2011-12-05 DIAGNOSIS — I509 Heart failure, unspecified: Secondary | ICD-10-CM

## 2011-12-05 DIAGNOSIS — I5021 Acute systolic (congestive) heart failure: Secondary | ICD-10-CM

## 2011-12-05 LAB — BASIC METABOLIC PANEL
CO2: 32 mEq/L (ref 19–32)
Chloride: 99 mEq/L (ref 96–112)
GFR calc Af Amer: 87 mL/min — ABNORMAL LOW (ref >90)
Potassium: 4.2 mEq/L (ref 3.5–5.1)

## 2011-12-05 LAB — CBC
HCT: 44.3 % (ref 39.0–52.0)
Hemoglobin: 14 g/dL (ref 13.0–17.0)
MCH: 25.8 pg — ABNORMAL LOW (ref 26.0–34.0)
RBC: 5.43 MIL/uL (ref 4.22–5.81)

## 2011-12-05 LAB — PROTIME-INR
INR: 2.18 — ABNORMAL HIGH (ref 0.00–1.49)
Prothrombin Time: 24.6 seconds — ABNORMAL HIGH (ref 11.6–15.2)

## 2011-12-05 LAB — GLUCOSE, CAPILLARY: Glucose-Capillary: 131 mg/dL — ABNORMAL HIGH (ref 70–99)

## 2011-12-05 MED ORDER — WARFARIN SODIUM 5 MG PO TABS: ORAL_TABLET | ORAL | Status: DC

## 2011-12-05 MED ORDER — WARFARIN SODIUM 2.5 MG PO TABS
2.5000 mg | ORAL_TABLET | Freq: Once | ORAL | Status: DC
  Filled 2011-12-05: qty 1

## 2011-12-05 MED ORDER — LISINOPRIL 10 MG PO TABS: 10.0000 mg | ORAL_TABLET | Freq: Every day | ORAL | Status: DC

## 2011-12-05 MED ORDER — LISINOPRIL 10 MG PO TABS
10.0000 mg | ORAL_TABLET | Freq: Every day | ORAL | Status: DC
  Administered 2011-12-05: 10 mg via ORAL
  Filled 2011-12-05: qty 1

## 2011-12-05 MED ORDER — NITROGLYCERIN 0.4 MG SL SUBL: 0.4000 mg | SUBLINGUAL_TABLET | SUBLINGUAL | Status: DC | PRN

## 2011-12-05 MED ORDER — POTASSIUM CHLORIDE ER 10 MEQ PO TBCR: 20.0000 meq | EXTENDED_RELEASE_TABLET | Freq: Every day | ORAL | Status: DC

## 2011-12-05 MED ORDER — CARVEDILOL 25 MG PO TABS: 25.0000 mg | ORAL_TABLET | Freq: Two times a day (BID) | ORAL | Status: DC

## 2011-12-05 MED ORDER — ATORVASTATIN CALCIUM 80 MG PO TABS: 80.0000 mg | ORAL_TABLET | Freq: Every day | ORAL | Status: DC

## 2011-12-05 MED ORDER — FUROSEMIDE 40 MG PO TABS: 40.0000 mg | ORAL_TABLET | Freq: Two times a day (BID) | ORAL | Status: DC

## 2011-12-05 NOTE — Discharge Summary (Addendum)
Physician Discharge Summary  Jacob [name] GNF:[id number] DOB: [birth year] DOA: 12/01/2011  PCP: No primary provider on file.  Admit date: 12/01/2011 Discharge date: 12/05/2011  Recommendations for Outpatient Follow-up:  1. Please follow up in Dr. Myrtis Ser' clinic within 7 to 10 days.  Rosann Auerbach will assist with scheduling appointment when patient arrives at coumadin clinic on Monday, 9/16.  Patient will need to be restarted on aldactone as outpatient.   2. Please schedule a follow up appointment with your primary care doctor within 1 week to have a weight check and make sure your blood pressure and electrolytes are stable.  Check CBC and BMP at this visit. 3. Finally, follow up for INR check on Monday, 9/16 at [facility] anticoagulation clinic at 2:30PM.  Discharge Diagnoses:  Active Problems:  HYPERTENSION  A-fib  Acute on chronic systolic heart failure  DM (diabetes mellitus)  Noncompliance  Coronary artery disease  Warfarin anticoagulation   Discharge Condition: stable, improved  Diet recommendation: 1.2L fluid restriction, less than 2gm sodium, and low carbohydrate diet.  Wt Readings from Last 3 Encounters:  12/04/11 93.849 kg (206 lb 14.4 oz)  12/04/11 93.849 kg (206 lb 14.4 oz)  11/08/11 100.299 kg (221 lb 1.9 oz)    History of present illness:  56 y.o. male, with significant past medical history of systolic CHF with recent echo in July of this year showing global hypokinesis with ejection fraction 25-30%, A. fib, DM, hypertension - patient presented with complaints of worsening lower extremity edema, and worsening shortness of breath - patient reported symptoms had been progressing over the last 2 weeks - upon presentation to ED patient was found to have A. fib with RVR with heart rate in the 110s - patient reported he had been compliant with his medication - a chest x-ray revealed bilateral pleural effusions.  Hospital Course:   Atrial fibrillation with rapid ventricular response (noted  first during admit Jul 2013), improving rate control and anticoagulated.  Patient was started on beta blocker and heart rate trended down to the 80s while at rest.  With exertion, his HR trends up to the 130s occasionally, but usually remains in the low 100s.  He was restarted on coumadin and should follow up for INR check on Monday, 9/16.  His INR rose rapidly over three days on warfarin 5mg  daily and he will be discharged with alternating doses of 2.5mg  and 5mg , starting with 2.5mg  on the day of discharge.   Acute systolic congestive heart failure exacerbation in setting of known chronic systolic heart failure  Last ejection fraction 25-30% with global hypokinesis July 2030 - currently estimated at 15-20% via echo (but in afib at time).  Patient is on beta blocker, ACEI and lasix. He will need aldactone restarted as outpatient.  His weight on admission was 100kg and on 9/11 was 94 kg.  He still has some edema at time of discharge.  CAD s/p catheterization 9/9 showing occlusion of small RCA with collaterals. Creatinine mildly elevated day after procedure, but returned to 1.1 on 9/12.  On statin, aspirin, beta-blocker.  Will prescribe nitroglycerin prn chest pain at discharge.  Hypertension with mild hypotension on 9/11 likely from restarting CHF and HTN medications during diuresis.  Lisinopril and aldactone held and lower dose lisinopril restarted on 9/12.  Blood pressures in Mid 110s systolic.  Mild hypokalemia resolved with oral supplementation.  Diabetes mellitus, CBG 129-272 over last 24 hours.  A1c 7.7  Restarted home medications and encouraged diabetic diet compliance.  Procedures: ECHO 9/8  Catheterization 9/9  Consultations:  Cardiology  Discharge Exam: Filed Vitals:   12/05/11 1001  BP: 111/68  Pulse:   Temp:   Resp:    Filed Vitals:   12/04/11 2100 12/05/11 0500 12/05/11 0725 12/05/11 1001  BP: 97/64 128/81 119/85 111/68  Pulse: 91 82 81   Temp: 98.4 F (36.9 C)  98.1 F (36.7 C)    TempSrc:      Resp: 18 18    Height:      Weight:      SpO2: 99% 97%     General: No acute distress  HEENT: PERRL, anicteric, nares and OP clear, MMM  NECK; Supple  CV: Irregularly irregular (not tachycardic), no murmurs  PULM: CTAB  ABD: Soft, nondistended, nontender  EXT: 1+ bilateral lower extremity edema  Discharge Instructions      Discharge Orders    Future Appointments: Provider: Department: Dept Phone: Center:   12/09/2011 2:30 PM [facility] [phone] None   02/11/2012 2:00 PM Vesta Mixer, MD [facility] [phone] None     Future Orders Please Complete By Expires   Diet - low sodium heart healthy      Comments:   Low carbohydrate with 1.2L fluid restriction.   Increase activity slowly      Discharge instructions      Comments:   You were hospitalized with shortness of breath and chest pain secondary to heart failure and coronary artery disease.  You were given diuretics and your swelling and symptoms improved.  Your ECHO, or ultrasound of the heart, showed that you have heart failure that is severe and you should make sure you take your medications as prescribed to prevent future exacerbations.  Please follow up with cardiology to discuss medications and the possibility of defibrillator placement.  You also have atrial fibrillation and rapid heartbeat.  Atrial fibrillation increases your risk for stroke and to reduce the risk of this happening, you should have your blood thinned with warfarin.  Please take half a tab of warfarin tonight, then a full tab tomorrow night, then half a tab the subsequent night and continue to alternate a full tab and a half tab until you follow up with the INR clinic on Monday.  You have coronary artery disease which was seen on cardiac catheterization.  Your right coronary artery is blocked, but blood is flowing through other vessels to that part of the heart.  If you have  chest pain, call 911 and take a nitroglycerin as described on the bottle.   Call MD for:      Comments:   Call 911 if you have chest pain with shortness of breath or symptoms of stroke, including slurred speech, facial droop, weakness of an arm or leg, or confusion.   Call MD for:  temperature >100.4      Call MD for:  persistant nausea and vomiting      Call MD for:  severe uncontrolled pain      Call MD for:  difficulty breathing, headache or visual disturbances      Call MD for:  hives      Call MD for:  persistant dizziness or light-headedness      Call MD for:  extreme fatigue      (HEART FAILURE PATIENTS) Call MD:  Anytime you have any of the following symptoms: 1) 3 pound weight gain in 24 hours or 5 pounds in 1 week 2) shortness of breath,  with or without a dry hacking cough 3) swelling in the hands, feet or stomach 4) if you have to sleep on extra pillows at night in order to breathe.          Medication List     As of 12/05/2011 10:46 AM    STOP taking these medications         spironolactone 25 MG tablet   Commonly known as: ALDACTONE      TAKE these medications         aspirin 81 MG tablet   Take 81 mg by mouth daily.      atorvastatin 80 MG tablet   Commonly known as: LIPITOR   Take 1 tablet (80 mg total) by mouth daily at 6 PM.      carvedilol 25 MG tablet   Commonly known as: COREG   Take 1 tablet (25 mg total) by mouth 2 (two) times daily with a meal.      furosemide 40 MG tablet   Commonly known as: LASIX   Take 1 tablet (40 mg total) by mouth 2 times daily at 12 noon and 4 pm.      glipiZIDE 5 MG tablet   Commonly known as: GLUCOTROL   Take 5 mg by mouth daily.      lisinopril 10 MG tablet   Commonly known as: PRINIVIL,ZESTRIL   Take 1 tablet (10 mg total) by mouth daily.      nitroGLYCERIN 0.4 MG SL tablet   Commonly known as: NITROSTAT   Place 1 tablet (0.4 mg total) under the tongue every 5 (five) minutes as needed for chest pain.       potassium chloride 10 MEQ tablet   Commonly known as: K-DUR   Take 2 tablets (20 mEq total) by mouth daily.      warfarin 5 MG tablet   Commonly known as: COUMADIN   Take half tab (2.5mg ) tonight 9/12 and then alternate 5mg  and 2.5mg  thereafter.        Follow-up Information    Follow up with Primary care doctor. Schedule an appointment as soon as possible for a visit in 1 week.   Contact information:   Please select new PCP from list provided by social worker and call for new appointment within 1 week for follow up of CHF exacerbation      Follow up with Anticoagulation clinic. In 4 days.   Contact information:    for INR check on Monday, 9/16 at 2:30PM.  Trish from Cardiology will help you schedule the follow up appointment at that time.          The results of significant diagnostics from this hospitalization (including imaging, microbiology, ancillary and laboratory) are listed below for reference.    Significant Diagnostic Studies: Dg Chest 2 View  12/01/2011  *RADIOLOGY REPORT*  Clinical Data: Shortness of breath and leg swelling.  CHEST - 2 VIEW  Comparison: 10/01/2011.  Findings: Cardiac enlargement with pulmonary vascular congestion. Bilateral pleural effusions, greater on the right.  Atelectasis in the lung bases.  Mild interstitial pattern without airspace edema. No focal consolidation.  No pneumothorax.  Mediastinal contours appear intact.  IMPRESSION: Cardiac enlargement with pulmonary vascular congestion and bilateral pleural effusions.   Original Report Authenticated By: Marlon Pel, M.D.     Microbiology: Recent Results (from the past 240 hour(s))  MRSA PCR SCREENING     Status: Normal   Collection Time   12/01/11  5:56 AM  Component Value Range Status Comment   MRSA by PCR NEGATIVE  NEGATIVE Final      Labs: Basic Metabolic Panel:  Lab 12/05/11 4098 12/04/11 1047 12/04/11 0510 12/02/11 0552 12/01/11 0348  NA 139 -- 138 138 141  K 4.2 -- 3.6  3.9 3.4*  CL 99 -- 96 97 99  CO2 32 -- 34* 32 --  GLUCOSE 161* -- 110* 111* 234*  BUN 28* -- 27* 25* 28*  CREATININE 1.09 -- 1.23 1.02 1.20  CALCIUM 9.2 -- 9.3 9.1 --  MG -- 1.9 -- -- --  PHOS -- -- -- -- --   Liver Function Tests:  Lab 12/02/11 0552  AST 15  ALT 17  ALKPHOS 168*  BILITOT 1.0  PROT 7.1  ALBUMIN 3.3*   No results found for this basename: LIPASE:5,AMYLASE:5 in the last 168 hours No results found for this basename: AMMONIA:5 in the last 168 hours CBC:  Lab 12/05/11 0604 12/04/11 0510 12/03/11 0929 12/02/11 0552 12/01/11 0348 12/01/11 0332  WBC 7.0 6.6 6.2 7.8 -- 7.5  NEUTROABS -- -- -- -- -- 5.1  HGB 14.0 13.4 13.6 13.1 14.3 --  HCT 44.3 42.2 43.1 41.7 42.0 --  MCV 81.6 80.7 81.6 82.1 -- 81.4  PLT 197 187 179 178 -- 165   Cardiac Enzymes:  Lab 12/01/11 1450 12/01/11 0943  CKTOTAL -- --  CKMB -- --  CKMBINDEX -- --  TROPONINI <0.30 <0.30   BNP: BNP (last 3 results)  Basename 12/01/11 0333 10/01/11 1430  PROBNP 2960.0* 3806.0*   CBG:  Lab 12/05/11 0724 12/04/11 1630 12/04/11 1111 12/04/11 0724 12/03/11 2041  GLUCAP 131* 129* 272* 121* 202*   *[facility]* *[facility]* [address] [place], [place] [address] [phone]  ------------------------------------------------------------ Transthoracic Echocardiography  Patient: Jacob [name], Jacob [name] MR #: [id number] Study Date: 12/01/2011 Gender: M Age: 30 Height: 162.6cm Weight: 100.5kg BSA: 2.21m^2 Pt. Status: Room: B19C  PERFORMING [place] SONOGRAPHER Nolon Rod, RDCS ADMITTING Elgergawy, [place] ATTENDING Elgergawy, [place] ORDERING Elger[place]y, [facility] Elgergawy, Dawood cc:  ------------------------------------------------------------ LV EF: 15% - 20%  ------------------------------------------------------------ Indications: CHF - 428.0.  ------------------------------------------------------------ History: PMH: Atrial fibrillation.  Congestive heart failure. Risk factors: Current tobacco use. Hypertension. Diabetes mellitus.  ------------------------------------------------------------ Study Conclusions  - Left ventricle: The cavity size was mildly dilated. There was mild to moderate concentric hypertrophy. Systolic function was severely reduced. The estimated ejection fraction was in the range of 15% to 20%. Severe diffuse hypokinesis. - Aortic valve: Mildly calcified annulus. - Left atrium: The atrium was moderately dilated. - Right ventricle: The cavity size was at the upper limits of normal. Wall thickness was moderately increased. Systolic function was mildly to moderately reduced. - Right atrium: The atrium was mildly to moderately dilated. - Pulmonary arteries: PA peak pressure: 45mm Hg (S). - Pericardium, extracardiac: A small pericardial effusion was identified posterior to the heart, along the right ventricular free wall, and along the right atrial free wall. Impressions:  - Compared to the previous study performed 10/02/11, further modest decline in LV systolic function and increase in pericardial effusion. Transthoracic echocardiography. M-mode, complete 2D, spectral Doppler, and color Doppler. Height: Height: 162.6cm. Height: 64in. Weight: Weight: 100.5kg. Weight: 221lb. Body mass index: BMI: 38kg/m^2. Body surface area: BSA: 2.18m^2. Blood pressure: 116/91. Patient status: Inpatient. Location: ICU/CCU  ------------------------------------------------------------  ------------------------------------------------------------ Left ventricle: The cavity size was mildly dilated. There was mild to moderate concentric hypertrophy. Systolic function was severely reduced. The estimated ejection fraction was in  the range of 15% to 20%. Severe diffuse hypokinesis.  ------------------------------------------------------------ Aortic valve: Mildly calcified annulus. Cusp separation was normal.  Doppler: Transvalvular velocity was within the normal range. There was no stenosis. No regurgitation.  ------------------------------------------------------------ Aorta: Aortic root: The aortic root was normal in size. Ascending aorta: The ascending aorta was normal in size. Descending aorta: The descending aorta was normal in size.  ------------------------------------------------------------ Mitral valve: Structurally normal valve. Leaflet separation was normal. Doppler: Transvalvular velocity was within the normal range. There was no evidence for stenosis. No regurgitation. Peak gradient: 4mm Hg (D).  ------------------------------------------------------------ Left atrium: The atrium was moderately dilated.  ------------------------------------------------------------ Right ventricle: The cavity size was at the upper limits of normal. Wall thickness was moderately increased. Systolic function was mildly to moderately reduced.  ------------------------------------------------------------ Pulmonic valve: Structurally normal valve. Cusp separation was normal. Doppler: Transvalvular velocity was within the normal range. No regurgitation.  ------------------------------------------------------------ Tricuspid valve: Structurally normal valve. Leaflet separation was normal. Doppler: Transvalvular velocity was within the normal range. Trivial regurgitation.  ------------------------------------------------------------ Pulmonary artery: The main pulmonary artery was normal-sized. Systolic pressure was within the normal range.  ------------------------------------------------------------ Right atrium: The atrium was mildly to moderately dilated.  ------------------------------------------------------------ Pericardium: A small pericardial effusion was identified posterior to the heart, along the right ventricular free wall, and along the right atrial free  wall.  ------------------------------------------------------------ Systemic veins: Inferior vena cava: The vessel was moderately dilated; the respirophasic diameter changes were blunted (< 50%); findings are consistent with elevated central venous pressure.  ------------------------------------------------------------  2D measurements Normal Doppler Normal Left ventricle measurements LVID ED, 56.2 mm 43-52 Main pulmonary chord, artery PLAX Pressure, S 45 mm =30 LVID ES, 52.2 mm 23-38 Hg chord, Left ventricle PLAX Ea, lat 7.57 cm/ ------- FS, chord, 7 % >29 ann, tiss s PLAX DP LVPW, ED 14.1 mm ------ E/Ea, lat 12.64 ------- IVS/LVPW 1.04 <1.3 ann, tiss ratio, ED DP Ventricular septum Ea, med 5.41 cm/ ------- IVS, ED 14.6 mm ------ ann, tiss s Aorta DP Root diam, 31 mm ------ E/Ea, med 17.69 ------- ED ann, tiss Left atrium DP AP dim 56 mm ------ Mitral valve AP dim 2.57 cm/m^2 <2.2 Peak E vel 95.7 cm/ ------- index s Deceleratio 151 ms 150-230 n time Peak 4 mm ------- gradient, D Hg Tricuspid valve Regurg peak 251 cm/ ------- vel s Peak RV-RA 25 mm ------- gradient, S Hg Systemic veins Estimated 20 mm ------- CVP Hg Right ventricle Pressure, S 45 mm <30 Hg  ------------------------------------------------------------ Prepared and Electronically Authenticated by   Patient: Jacob [name], Jacob [name] MR #: [id number] Study Date: 12/01/2011 Gender: M Age: 45 Height: Weight: BSA: Pt. Status: Room: B19C  SONOGRAPHER Sherren Kerns, RVS ADMITTING Elgergawy, [place] ATTENDING Elgergawy, [place] ORDERING Elgergawy, [place] REFERRING Elgergawy, [place]wood Reports also to:  ------------------------------------------------------------ History and indications:  Indications  729.81 Swelling of limb. CHF.Marland Kitchen History  Diagnostic evaluation.  ------------------------------------------------------------ Study information:  Portable. Study status: Routine. Procedure: A  vascular evaluation was performed with the patient in the supine position. The right common femoral, right femoral, right greater saphenous, right lesser saphenous, right profunda femoral, right popliteal, right peroneal, right posterior tibial, left common femoral, left femoral, left greater saphenous, left lesser saphenous, left profunda femoral, left popliteal, left peroneal, and left posterior tibial veins were studied. Image quality was adequate. Bilateral lower extremity venous duplex evaluation. Doppler flow study including B-mode compression maneuvers of all visualized segments, color flow Doppler and selected views of pulsed wave Doppler. Location: CCU Patient status: Inpatient. Venous flow:  +--------------------------+-------+----------------------+ Location  OverallFlow properties  +--------------------------+-------+----------------------+ Right common femoral Patent Phasic; spontaneous;    compressible  +--------------------------+-------+----------------------+ Right femoral Patent Compressible  +--------------------------+-------+----------------------+ Right profunda femoral Patent Compressible  +--------------------------+-------+----------------------+ Right popliteal Patent Phasic; spontaneous;    compressible  +--------------------------+-------+----------------------+ Right posterior tibial Patent Compressible  +--------------------------+-------+----------------------+ Right peroneal Patent Compressible  +--------------------------+-------+----------------------+ Right saphenofemoral Patent Compressible  junction    +--------------------------+-------+----------------------+ Right greater saphenous Patent Compressible  +--------------------------+-------+----------------------+ Right lesser saphenous Patent Compressible  +--------------------------+-------+----------------------+ Left common femoral  Patent Phasic; spontaneous;    compressible  +--------------------------+-------+----------------------+ Left femoral Patent Compressible  +--------------------------+-------+----------------------+ Left profunda femoral Patent Compressible  +--------------------------+-------+----------------------+ Left popliteal Patent Phasic; spontaneous;    compressible  +--------------------------+-------+----------------------+ Left posterior tibial Patent Compressible  +--------------------------+-------+----------------------+ Left peroneal Patent Compressible  +--------------------------+-------+----------------------+ Left saphenofemoral Patent Compressible  junction    +--------------------------+-------+----------------------+ Left greater saphenous Patent Compressible  +--------------------------+-------+----------------------+ Left lesser saphenous Patent Compressible  +--------------------------+-------+----------------------+  ------------------------------------------------------------ Summary:  - No evidence of deep vein or superficial thrombosis involving the right lower extremity and left lower extremity. - No evidence of Baker's cyst on the right or left. Other specific details can be found in the table(s) above. Prepared and Electronically Authenticated by     Time coordinating discharge: 45 minutes  Signed:  Ravleen Ries  [facility] 12/05/2011, 10:46 AM

## 2011-12-05 NOTE — Progress Notes (Signed)
Patient ID: Jacob [name], male   DOB: December 22, 1955, 56 y.o.   MRN: [id number]   SUBJECTIVE:  The patient says that he is feeling better. He is insistent upon going home today. Yesterday his blood pressure was low in some of his medications were held. It has been impossible to keep up with his input and output. However his weights show that he has had a significant weight loss since being in the hospital due to diuresis. This appears to be in the 15 pound range. He still has edema.   Filed Vitals:   12/04/11 1641 12/04/11 2100 12/05/11 0500 12/05/11 0725  BP:  97/64 128/81 119/85  Pulse: 103 91 82 81  Temp:  98.4 F (36.9 C) 98.1 F (36.7 C)   TempSrc:      Resp:  18 18   Height:      Weight:      SpO2:  99% 97%     Intake/Output Summary (Last 24 hours) at 12/05/11 0754 Last data filed at 12/04/11 1100  Gross per 24 hour  Intake      0 ml  Output    700 ml  Net   -700 ml    LABS: Basic Metabolic Panel:  Basename 12/05/11 0604 12/04/11 1047 12/04/11 0510  NA 139 -- 138  K 4.2 -- 3.6  CL 99 -- 96  CO2 32 -- 34*  GLUCOSE 161* -- 110*  BUN 28* -- 27*  CREATININE 1.09 -- 1.23  CALCIUM 9.2 -- 9.3  MG -- 1.9 --  PHOS -- -- --   Liver Function Tests: No results found for this basename: AST:2,ALT:2,ALKPHOS:2,BILITOT:2,PROT:2,ALBUMIN:2 in the last 72 hours No results found for this basename: LIPASE:2,AMYLASE:2 in the last 72 hours CBC:  Basename 12/05/11 0604 12/04/11 0510  WBC 7.0 6.6  NEUTROABS -- --  HGB 14.0 13.4  HCT 44.3 42.2  MCV 81.6 80.7  PLT 197 187   Cardiac Enzymes: No results found for this basename: CKTOTAL:3,CKMB:3,CKMBINDEX:3,TROPONINI:3 in the last 72 hours BNP: No components found with this basename: POCBNP:3 D-Dimer: No results found for this basename: DDIMER:2 in the last 72 hours Hemoglobin A1C: No results found for this basename: HGBA1C in the last 72 hours Fasting Lipid Panel: No results found for this basename:  CHOL,HDL,LDLCALC,TRIG,CHOLHDL,LDLDIRECT in the last 72 hours Thyroid Function Tests: No results found for this basename: TSH,T4TOTAL,FREET3,T3FREE,THYROIDAB in the last 72 hours  RADIOLOGY: Dg Chest 2 View  12/01/2011  *RADIOLOGY REPORT*  Clinical Data: Shortness of breath and leg swelling.  CHEST - 2 VIEW  Comparison: 10/01/2011.  Findings: Cardiac enlargement with pulmonary vascular congestion. Bilateral pleural effusions, greater on the right.  Atelectasis in the lung bases.  Mild interstitial pattern without airspace edema. No focal consolidation.  No pneumothorax.  Mediastinal contours appear intact.  IMPRESSION: Cardiac enlargement with pulmonary vascular congestion and bilateral pleural effusions.   Original Report Authenticated By: Marlon Pel, M.D.     PHYSICAL EXAM  Patient is oriented to person time and place. He is insisting to go home today. Lungs reveal scattered rhonchi. Cardiac exam her vitals S1 and S2. There no clicks or significant murmurs. The abdomen is soft. He still has 1+ peripheral edema.   TELEMETRY:   I have reviewed telemetry today December 05, 2011. He has atrial fib. The rate is controlled at rest. With increased activity he has increasing heart rate. This will have to be addressed over time. We will not be able to make any further medication adjustments while  he is in the hospital.   ASSESSMENT AND PLAN:   HYPERTENSION   Blood pressure was low yesterday and medications held. Renal function looks better today. I restarted lisinopril today at 10 mg daily. I've discontinued spironolactone. This can be restarted as an outpatient.   A-fib   Atrial fib rate is controlled at rest but increases with stress. Other medications can be added over time. I suggest not doing that today.   Acute on chronic systolic heart failure    We have not been able to give reliable input and output data. However the patient has had significant weight loss during the hospital. He is  diaries 15 pounds. He was not taking his medications prior to admission. He is insistent on going home. The key will be to get him home on his current medications. The other key will be to have early post hospital followup. Our team will be available to have him seen in our office within 7-10 days. His primary cardiologist is Dr. Elease Hashimoto. Please communicate with Trish of our team to help arrange followup either with physician or a PA/NP in our office. It is very important that this be arranged before he goes home.    Coronary artery disease    Medical therapy of coronary disease.   Warfarin anticoagulation   Dosing is continuing.   Willa Rough 12/05/2011 7:54 AM

## 2011-12-05 NOTE — Progress Notes (Signed)
D/c orders received;IV removed with gauze on, pt remains in stable condition, pt meds and instructions reviewed and given to pt; stressed to the pt multiple times the importance of taking meds as prescribed, finding a PCP, follow up appointments, and weighing himself daily; pt given HF packet; pt d/c to home

## 2011-12-05 NOTE — Progress Notes (Signed)
ANTICOAGULATION CONSULT NOTE - Follow Up Consult  Pharmacy Consult for Lovenox and Warfarin  Indication: atrial fibrillation  No Known Allergies  Patient Measurements: Height: 5' 4.96" (165 cm) Weight: 206 lb 14.4 oz (93.849 kg) IBW/kg (Calculated) : 61.41  Heparin Dosing Weight: 82.5kg  Vital Signs: Temp: 98.1 F (36.7 C) (09/12 0500) BP: 119/85 mmHg (09/12 0725) Pulse Rate: 81  (09/12 0725)  Labs:  Basename 12/05/11 0604 12/04/11 1320 12/04/11 0510 12/03/11 1707 12/03/11 0929  HGB 14.0 -- 13.4 -- --  HCT 44.3 -- 42.2 -- 43.1  PLT 197 -- 187 -- 179  APTT -- -- -- -- --  LABPROT 24.6* -- 18.4* -- 17.7*  INR 2.18* -- 1.50* -- 1.43  HEPARINUNFRC -- 0.93* 0.85* 0.69 --  CREATININE 1.09 -- 1.23 -- --  CKTOTAL -- -- -- -- --  CKMB -- -- -- -- --  TROPONINI -- -- -- -- --    Estimated Creatinine Clearance: 81.5 ml/min (by C-G formula based on Cr of 1.09).  Assessment: 56 yo male s/p cath on 9/10 to continue Lovenox/Warfarin for AFib in preparation for discharge.  Pt weighs 93.8 kg, CrCl ~81 ml/min. INR this AM is therapeutic at 2.18 but with a large increase in INR after three doses of 7.5 mg. Will give lower dose today as likely have not seen full effects of the higher Coumadin doses thus far. CBC stable. No bleeding reported per RN.    Goal of Therapy:  INR 2-3 Xa levels 0.6-1.2 units/ml 4-hours after dose Monitor platelets by anticoagulation protocol: Yes   Plan:  Continue Lovenox 95 mg SQ q12h  Coumadin 2.5 mg PO x1 today Follow up AM labs if still here, if not would need follow up in 3-4 days  Crist Fat L  12/05/2011,8:50 AM

## 2011-12-09 ENCOUNTER — Ambulatory Visit (INDEPENDENT_AMBULATORY_CARE_PROVIDER_SITE_OTHER): Payer: Medicaid Other | Admitting: Pharmacist

## 2011-12-09 DIAGNOSIS — Z7901 Long term (current) use of anticoagulants: Secondary | ICD-10-CM

## 2011-12-09 DIAGNOSIS — I4891 Unspecified atrial fibrillation: Secondary | ICD-10-CM

## 2011-12-09 LAB — POCT INR: INR: 2.2

## 2011-12-16 ENCOUNTER — Encounter: Payer: Self-pay | Admitting: Nurse Practitioner

## 2011-12-16 ENCOUNTER — Ambulatory Visit (INDEPENDENT_AMBULATORY_CARE_PROVIDER_SITE_OTHER): Payer: Medicaid Other | Admitting: *Deleted

## 2011-12-16 ENCOUNTER — Ambulatory Visit (INDEPENDENT_AMBULATORY_CARE_PROVIDER_SITE_OTHER): Payer: Medicaid Other | Admitting: Nurse Practitioner

## 2011-12-16 VITALS — BP 134/80 | HR 93 | Ht 64.0 in | Wt 220.0 lb

## 2011-12-16 DIAGNOSIS — I5022 Chronic systolic (congestive) heart failure: Secondary | ICD-10-CM

## 2011-12-16 DIAGNOSIS — I4891 Unspecified atrial fibrillation: Secondary | ICD-10-CM

## 2011-12-16 DIAGNOSIS — Z7901 Long term (current) use of anticoagulants: Secondary | ICD-10-CM

## 2011-12-16 LAB — POCT INR: INR: 2.7

## 2011-12-16 MED ORDER — SPIRONOLACTONE 25 MG PO TABS: 25.0000 mg | ORAL_TABLET | Freq: Every day | ORAL | Status: DC

## 2011-12-16 MED ORDER — WARFARIN SODIUM 5 MG PO TABS: 5.0000 mg | ORAL_TABLET | ORAL | Status: DC

## 2011-12-16 NOTE — Patient Instructions (Addendum)
Stop your potassium  I am adding Aldactone 25 mg to take each morning.  Stay on your other medicines  Avoid salt  Weigh each morning. Call us if you gain over 2 to 3 pounds overnight  We need to check lab in one week  I will see you in 2 weeks with repeat labs  Call the [facility] office at [phone] if you have any questions, problems or concerns.

## 2011-12-16 NOTE — Progress Notes (Signed)
Jacob [name] Date of Birth: [birth year] Medical Record #[id number]  History of Present Illness: Jacob [name] is seen back today for a post hospital visit. He is seen for Dr. Elease Hashimoto. He has systolic heart failure. EF is about 25%. Recently admitted with worsening CHF symptoms and noted to be in atrial fib with RVR. On coumadin now. Did have cardiac cath with results below. Will be managed medically. He has had issues with noncompliance.  He comes in today. An interpreter is here as well. He says he is doing ok. Sometimes gets short of breath. Trying to use less salt. No chest pain. Says he is taking his medicines but only taking the lasix/potassium once a day instead of twice. He does not seem to have an awareness of his atrial fib. He is on coumadin. Level is therapeutic today. Dr. Johney Frame had seen him in the hospital and recommended that if we keep him on coumadin and compliant then we should pursue means to restore sinus rhythm. 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 He is asking about having his teeth extracted.   Current Outpatient Prescriptions on File Prior to Visit  Medication Sig Dispense Refill  . aspirin 81 MG tablet Take 81 mg by mouth daily.      Marland Kitchen atorvastatin (LIPITOR) 80 MG tablet Take 1 tablet (80 mg total) by mouth daily at 6 PM.  90 tablet  3  . carvedilol (COREG) 25 MG tablet Take 1 tablet (25 mg total) by mouth 2 (two) times daily with a meal.  60 tablet  3  . glipiZIDE (GLUCOTROL) 5 MG tablet Take 5 mg by mouth daily.      Marland Kitchen lisinopril (PRINIVIL,ZESTRIL) 10 MG tablet Take 1 tablet (10 mg total) by mouth daily.  30 tablet  3  . nitroGLYCERIN (NITROSTAT) 0.4 MG SL tablet Place 1 tablet (0.4 mg total) under the tongue every 5 (five) minutes as needed for chest pain.  10 tablet  12  . warfarin (COUMADIN) 5 MG tablet Take 1 tablet (5 mg total) by mouth as directed.  30 tablet  3  . DISCONTD: furosemide (LASIX) 40 MG tablet Take 1 tablet (40 mg total) by mouth 2 times daily  at 12 noon and 4 pm.  60 tablet  0  . DISCONTD: potassium chloride (K-DUR) 10 MEQ tablet Take 2 tablets (20 mEq total) by mouth daily.  60 tablet  0  . DISCONTD: warfarin (COUMADIN) 5 MG tablet Take half tab (2.5mg ) tonight 9/12 and then alternate 5mg  and 2.5mg  thereafter.  30 tablet  0    No Known Allergies  Past Medical History  Diagnosis Date  . Diabetes mellitus   . Hypertension     Past Surgical History  Procedure Date  . None     History  Smoking status  . Former Smoker -- 0.5 packs/day  . Quit date: 09/15/2011  Smokeless tobacco  . Never Used  Comment: Stopped 10-08-11    History  Alcohol Use No    Family History  Problem Relation Age of Onset  . Diabetes Mother     Review of Systems: The review of systems is positive for some mild bruising.  All other systems were reviewed and are negative.  Physical Exam: BP 134/80  Pulse 93  Ht 5\' 4"  (1.626 m)  Wt 220 lb (99.791 kg)  BMI 37.76 kg/m2 Patient is pleasant and in no acute distress. Skin is warm and dry. Color is normal.  HEENT is unremarkable except for several  missing teeth. Normocephalic/atraumatic. PERRL. Sclera are nonicteric. Neck is supple. No masses. No JVD. Lungs are clear. Cardiac exam shows an irregular rhythm. His rate is controlled. Abdomen is soft. Extremities are without edema. Gait and ROM are intact. No gross neurologic deficits noted.   LABORATORY DATA:  Lab Results  Component Value Date   WBC 7.0 12/05/2011   HGB 14.0 12/05/2011   HCT 44.3 12/05/2011   PLT 197 12/05/2011   GLUCOSE 161* 12/05/2011   CHOL 114 12/01/2011   TRIG 51 12/01/2011   HDL 33* 12/01/2011   LDLCALC 71 12/01/2011   ALT 17 12/02/2011   AST 15 12/02/2011   NA 139 12/05/2011   K 4.2 12/05/2011   CL 99 12/05/2011   CREATININE 1.09 12/05/2011   BUN 28* 12/05/2011   CO2 32 12/05/2011   TSH 1.519 10/01/2011   INR 2.7 12/16/2011   HGBA1C 7.7* 12/01/2011   Lab Results  Component Value Date   INR 2.7 12/16/2011   INR 2.2 12/09/2011   INR  2.18* 12/05/2011    Cardiac Cath Final Conclusions:   1. 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 Patient does have diffuse nonobstructive disease.  2. Moderate pulmonary hypertension with elevated left ventricular filling pressures.  Recommendations: Continue aggressive medical management of his congestive heart failure. We will add statin therapy. We'll resume anticoagulation with heparin and then transition to Coumadin orally for his atrial fibrillation.   Theron Arista [facility]  12/02/2011, 9:11 AM   Assessment / Plan:  1. Systolic heart failure - EF is 25 to 30%. Looks compensated on exam today. He is on beta blocker/diuretic and ACE. I have added Aldactone 25 mg. Stopped his potassium today. Will check a BMET today, in one week and when I see him back in 2 weeks. We talked about the need for salt restriction, daily weights and medication/appointment compliance.   2. Atrial fib - on coumadin. Would try to pursue cardioversion if he can demonstrate compliance.   I will see him back in 2 weeks. Patient is agreeable to this plan and will call if any problems develop in the interim.

## 2011-12-17 LAB — BASIC METABOLIC PANEL
BUN: 23 mg/dL (ref 6–23)
CO2: 24 mEq/L (ref 19–32)
Calcium: 8.8 mg/dL (ref 8.4–10.5)
Chloride: 101 mEq/L (ref 96–112)
Creatinine, Ser: 0.9 mg/dL (ref 0.4–1.5)
GFR: 94.38 mL/min (ref >60.00)
Glucose, Bld: 220 mg/dL — ABNORMAL HIGH (ref 70–99)
Potassium: 4.3 mEq/L (ref 3.5–5.1)
Sodium: 140 mEq/L (ref 135–145)

## 2011-12-18 ENCOUNTER — Telehealth: Payer: Self-pay | Admitting: *Deleted

## 2011-12-18 NOTE — Telephone Encounter (Signed)
Pt notified of lab results.  Amanda Becker, CMA 

## 2011-12-18 NOTE — Telephone Encounter (Signed)
Message copied by Awilda Bill on Wed Dec 18, 2011  3:40 PM ------      Message from: Rosalio Macadamia      Created: Tue Dec 17, 2011  2:30 PM       Ok to report. Labs are satisfactory. He was not fasting.

## 2011-12-23 ENCOUNTER — Other Ambulatory Visit: Payer: Medicaid Other | Admitting: *Deleted

## 2011-12-24 ENCOUNTER — Ambulatory Visit (INDEPENDENT_AMBULATORY_CARE_PROVIDER_SITE_OTHER): Payer: Medicaid Other | Admitting: Pharmacist

## 2011-12-24 ENCOUNTER — Telehealth: Payer: Self-pay | Admitting: Pharmacist

## 2011-12-24 DIAGNOSIS — Z7901 Long term (current) use of anticoagulants: Secondary | ICD-10-CM

## 2011-12-24 DIAGNOSIS — I4891 Unspecified atrial fibrillation: Secondary | ICD-10-CM

## 2011-12-24 LAB — POCT INR: INR: 4.3

## 2011-12-24 NOTE — Telephone Encounter (Signed)
Patient states has been having increase SOB for the last four days. Pt is not able to sleep in his bed on ether side,  he sleeps sitting up on a chair . Pt has LE swelling. Pt has gain 2 lbs since 12/16/11. Patient is schedule to takes lasix 40 mg lasix twice a day one at 12:00 noon and at 4:PM. Also pt is to take Aldactone 25 mg daily. Pt has been take Lasix 40 mg one time in AM only and the aldactone 25 mg as schedule.Dr. Elease Hashimoto aware of pt's symptoms and recommends for pt to follow Jacob [name]'s recommendations and avoid salt. Pt is to keep appointment with NP on 12/30/11. Patient verbalized understanding.

## 2011-12-24 NOTE — Telephone Encounter (Signed)
Patient was seen in [facility] today.  Patient reports increasing shortness of breath, having to sleep upright in a recliner. Weight checked at today's visit. Up 2 lbs from 9/23 (222 lbs today).  Patient reports bilateral lower extremity edema. INR elevated, questionably secondary to fluid retention.  Patient reports currently taking Lasix 40 mg daily. He has an appointment with Lawson Fiscal on 10/11.  Please call patient and advise.

## 2011-12-30 ENCOUNTER — Ambulatory Visit (INDEPENDENT_AMBULATORY_CARE_PROVIDER_SITE_OTHER): Payer: Medicaid Other | Admitting: Nurse Practitioner

## 2011-12-30 ENCOUNTER — Other Ambulatory Visit (INDEPENDENT_AMBULATORY_CARE_PROVIDER_SITE_OTHER): Payer: Medicaid Other

## 2011-12-30 ENCOUNTER — Encounter: Payer: Self-pay | Admitting: Nurse Practitioner

## 2011-12-30 ENCOUNTER — Ambulatory Visit (INDEPENDENT_AMBULATORY_CARE_PROVIDER_SITE_OTHER): Payer: Medicaid Other | Admitting: Pharmacist

## 2011-12-30 VITALS — BP 132/86 | HR 102 | Ht 64.0 in | Wt 225.0 lb

## 2011-12-30 DIAGNOSIS — I4891 Unspecified atrial fibrillation: Secondary | ICD-10-CM

## 2011-12-30 DIAGNOSIS — Z7901 Long term (current) use of anticoagulants: Secondary | ICD-10-CM

## 2011-12-30 DIAGNOSIS — R0989 Other specified symptoms and signs involving the circulatory and respiratory systems: Secondary | ICD-10-CM

## 2011-12-30 DIAGNOSIS — I5022 Chronic systolic (congestive) heart failure: Secondary | ICD-10-CM

## 2011-12-30 LAB — BASIC METABOLIC PANEL
BUN: 24 mg/dL — ABNORMAL HIGH (ref 6–23)
CO2: 30 mEq/L (ref 19–32)
Calcium: 8.9 mg/dL (ref 8.4–10.5)
Chloride: 96 mEq/L (ref 96–112)
Creatinine, Ser: 1 mg/dL (ref 0.4–1.5)
GFR: 80.63 mL/min (ref >60.00)
Glucose, Bld: 290 mg/dL — ABNORMAL HIGH (ref 70–99)
Potassium: 4.1 mEq/L (ref 3.5–5.1)
Sodium: 134 mEq/L — ABNORMAL LOW (ref 135–145)

## 2011-12-30 LAB — POCT INR: INR: 2.2

## 2011-12-30 MED ORDER — CARVEDILOL 25 MG PO TABS: 37.5000 mg | ORAL_TABLET | Freq: Two times a day (BID) | ORAL | Status: DC

## 2011-12-30 MED ORDER — FUROSEMIDE 40 MG PO TABS: ORAL_TABLET | ORAL | Status: DC

## 2011-12-30 NOTE — Progress Notes (Signed)
[name] Date of Birth: 1955-09-11 Medical Record #[id number]  History of Present Illness: Mr. Riva is seen back today for a 3 week check. He is seen for Dr. Elease Hashimoto. He has systolic heart failure with an EF of 25%. Has atrial fib. On coumadin. Cardiac cath showing diffuse nonobstructive CAD with single vessel occlusive disease involving a nondominant RCA - managed medically. He has had issues with compliance. The plan was to optimize his medicines. If he can demonstrate compliance with his coumadin then we should pursue means to restore sinus rhythm. It was Dr. 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   He comes in today. The interpreter is with him but we did not have an issue with language or comprehension. Says he is having more swelling. Feels more short of breath. Weight is up. Not coughing. Says he is not using salt but tells me that he is eating "Healthy Choice" foods. He says the boxes say "no salt". No chest pain. He says he is taking his medicines.    Current Outpatient Prescriptions on File Prior to Visit  Medication Sig Dispense Refill  . aspirin 81 MG tablet Take 81 mg by mouth daily.      Marland Kitchen atorvastatin (LIPITOR) 80 MG tablet Take 1 tablet (80 mg total) by mouth daily at 6 PM.  90 tablet  3  . glipiZIDE (GLUCOTROL) 5 MG tablet Take 5 mg by mouth daily.      Marland Kitchen lisinopril (PRINIVIL,ZESTRIL) 10 MG tablet Take 1 tablet (10 mg total) by mouth daily.  30 tablet  3  . nitroGLYCERIN (NITROSTAT) 0.4 MG SL tablet Place 1 tablet (0.4 mg total) under the tongue every 5 (five) minutes as needed for chest pain.  10 tablet  12  . spironolactone (ALDACTONE) 25 MG tablet Take 1 tablet (25 mg total) by mouth daily.  30 tablet  3  . warfarin (COUMADIN) 5 MG tablet Take 1 tablet (5 mg total) by mouth as directed.  30 tablet  3  . DISCONTD: carvedilol (COREG) 25 MG tablet Take 1 tablet (25 mg total) by mouth 2 (two) times daily with a meal.  60 tablet  3  . DISCONTD:  furosemide (LASIX) 40 MG tablet Take 40 mg by mouth daily.        No Known Allergies  Past Medical History  Diagnosis Date  . Diabetes mellitus   . Hypertension   . Systolic heart failure     EF is 25 to 30%  . Noncompliance   . CAD (coronary artery disease) Sept 2013    s/p cardiac cath showing occlusion of small RCA with collaterals  . Chronic anticoagulation     on coumadin  . Atrial fibrillation     Past Surgical History  Procedure Date  . S/p cardiac cath Sept 2013    History  Smoking status  . Former Smoker -- 0.5 packs/day  . Quit date: 09/15/2011  Smokeless tobacco  . Never Used  Comment: Stopped 10-08-11    History  Alcohol Use No    Family History  Problem Relation Age of Onset  . Diabetes Mother     Review of Systems: The review of systems is per the HPI.  All other systems were reviewed and are negative.  Physical Exam: BP 132/86  Pulse 102  Ht 5\' 4"  (1.626 m)  Wt 225 lb (102.059 kg)  BMI 38.62 kg/m2 Weight is up 5 pounds.  Patient is pleasant and in no acute distress. Skin  is warm and dry. Color is normal.  HEENT is unremarkable. Normocephalic/atraumatic. PERRL. Sclera are nonicteric. Neck is supple. No masses. No JVD. Lungs are clear. Cardiac exam shows an irregular rate and rhythm. He was a little more tachycardic at the beginning of the exam. Abdomen is soft. Extremities are with 1+ edema. Gait and ROM are intact. No gross neurologic deficits noted.  LABORATORY DATA: BMET is pending  EKG shows atrial fib with increased ventricular response. Rate is 102.   Lab Results  Component Value Date   WBC 7.0 12/05/2011   HGB 14.0 12/05/2011   HCT 44.3 12/05/2011   PLT 197 12/05/2011   GLUCOSE 220* 12/16/2011   CHOL 114 12/01/2011   TRIG 51 12/01/2011   HDL 33* 12/01/2011   LDLCALC 71 12/01/2011   ALT 17 12/02/2011   AST 15 12/02/2011   NA 140 12/16/2011   K 4.3 12/16/2011   CL 101 12/16/2011   CREATININE 0.9 12/16/2011   BUN 23 12/16/2011   CO2 24 12/16/2011     TSH 1.519 10/01/2011   INR 2.2 12/30/2011   HGBA1C 7.7* 12/01/2011    Assessment / Plan:  1. Systolic heart failure - his weight is up. He has evidence of volume overload on physical exam. I have increased his Lasix to 40 mg BID for the next 4 days, then back to his regular dose. Check BMET today.  2. Atrial fib - rate still not controlled. Coreg is increased to 37.5 BID. May need to consider Lanoxin as well.   I will see him back in about 2 weeks. We are checking labs today. Diuretics are increased. Coreg is increased. INR will be checked today as well. He is to avoid salt.   Patient is agreeable to this plan and will call if any problems develop in the interim.

## 2011-12-30 NOTE — Patient Instructions (Signed)
I want you to increase the Lasix (furosemide) to two times a day -  Morning and early afternoon for the next 4 days, then back to just one a day.  I am going to increase the Coreg to one and a half tablets two times a day (37.5 mg)  Avoid salt  I need to see you in 2 weeks   Call the [facility] office at [phone] if you have any questions, problems or concerns.

## 2012-01-03 ENCOUNTER — Ambulatory Visit: Payer: Medicaid Other | Admitting: Nurse Practitioner

## 2012-01-03 ENCOUNTER — Other Ambulatory Visit: Payer: Medicaid Other

## 2012-01-13 ENCOUNTER — Ambulatory Visit (INDEPENDENT_AMBULATORY_CARE_PROVIDER_SITE_OTHER): Payer: Medicaid Other | Admitting: Nurse Practitioner

## 2012-01-13 ENCOUNTER — Ambulatory Visit: Payer: Medicaid Other | Admitting: Nurse Practitioner

## 2012-01-13 ENCOUNTER — Ambulatory Visit (INDEPENDENT_AMBULATORY_CARE_PROVIDER_SITE_OTHER): Payer: Medicaid Other

## 2012-01-13 ENCOUNTER — Encounter: Payer: Self-pay | Admitting: Nurse Practitioner

## 2012-01-13 VITALS — BP 130/80 | HR 110 | Ht 65.0 in | Wt 228.8 lb

## 2012-01-13 DIAGNOSIS — R0600 Dyspnea, unspecified: Secondary | ICD-10-CM

## 2012-01-13 DIAGNOSIS — I420 Dilated cardiomyopathy: Secondary | ICD-10-CM

## 2012-01-13 DIAGNOSIS — I4891 Unspecified atrial fibrillation: Secondary | ICD-10-CM

## 2012-01-13 DIAGNOSIS — R0609 Other forms of dyspnea: Secondary | ICD-10-CM

## 2012-01-13 DIAGNOSIS — Z7901 Long term (current) use of anticoagulants: Secondary | ICD-10-CM

## 2012-01-13 DIAGNOSIS — I428 Other cardiomyopathies: Secondary | ICD-10-CM

## 2012-01-13 LAB — BASIC METABOLIC PANEL
BUN: 20 mg/dL (ref 6–23)
CO2: 35 mEq/L — ABNORMAL HIGH (ref 19–32)
Calcium: 8.1 mg/dL — ABNORMAL LOW (ref 8.4–10.5)
Chloride: 99 mEq/L (ref 96–112)
Creatinine, Ser: 0.9 mg/dL (ref 0.4–1.5)
GFR: 93.15 mL/min (ref >60.00)
Glucose, Bld: 201 mg/dL — ABNORMAL HIGH (ref 70–99)
Potassium: 3.8 mEq/L (ref 3.5–5.1)
Sodium: 139 mEq/L (ref 135–145)

## 2012-01-13 LAB — BRAIN NATRIURETIC PEPTIDE: Pro B Natriuretic peptide (BNP): 1267 pg/mL — ABNORMAL HIGH (ref 0.0–100.0)

## 2012-01-13 MED ORDER — FUROSEMIDE 40 MG PO TABS: ORAL_TABLET | ORAL | Status: DC

## 2012-01-13 MED ORDER — DIGOXIN 125 MCG PO TABS: 0.1250 mg | ORAL_TABLET | Freq: Every day | ORAL | Status: DC

## 2012-01-13 NOTE — Patient Instructions (Signed)
Increase your Lasix (furosemide) to 2 pills in the am and 1 pill in the early afternoon  We are adding Lanoxin 0.125 mg daily to help slow your heart rate down  We need to check labs today. You will have labs when you come back  We will check your coumadin today.  See Dr. Elease Hashimoto on the 19th of November.  Avoid salt!!  Call the [facility] office at [phone] if you have any questions, problems or concerns.

## 2012-01-13 NOTE — Progress Notes (Signed)
Jacob [name] Date of Birth: 26-Jan-1956 Medical Record #[id number]  History of Present Illness: The patient is seen back today for a 2 week check. He is seen for Dr. Elease Hashimoto. He has systolic heart failure. EF is 35%. He has atrial fib. Rate has been difficult to control. His cath showed diffuse nonobstructive CAD with single vessel occlusive disease involving a nondominant RCA - managed medically. He has had issues with compliance. The plan was to optimize his medicines. If he can demonstrate compliance then we were going to try and pursue means to restore sinus rhythm. It was Dr. 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   He is back today. He is here with the interpreter but we have not had an issue with language or understanding. He remains short of breath. Weight is still going up. He has swelling. He feels like it is all fluid. Gets full quickly. He is on ACE/BB/aldactone He stayed on the BID dosing of his Lasix. No chest pain. Says he is not using salt. Says he is taking his medicines. Heart rate is still up.   Current Outpatient Prescriptions on File Prior to Visit  Medication Sig Dispense Refill  . aspirin 81 MG tablet Take 81 mg by mouth daily.      Marland Kitchen atorvastatin (LIPITOR) 80 MG tablet Take 1 tablet (80 mg total) by mouth daily at 6 PM.  90 tablet  3  . carvedilol (COREG) 25 MG tablet Take 1.5 tablets (37.5 mg total) by mouth 2 (two) times daily with a meal.  60 tablet  3  . glipiZIDE (GLUCOTROL) 5 MG tablet Take 5 mg by mouth daily.      Marland Kitchen lisinopril (PRINIVIL,ZESTRIL) 10 MG tablet Take 1 tablet (10 mg total) by mouth daily.  30 tablet  3  . nitroGLYCERIN (NITROSTAT) 0.4 MG SL tablet Place 1 tablet (0.4 mg total) under the tongue every 5 (five) minutes as needed for chest pain.  10 tablet  12  . spironolactone (ALDACTONE) 25 MG tablet Take 1 tablet (25 mg total) by mouth daily.  30 tablet  3  . warfarin (COUMADIN) 5 MG tablet Take 1 tablet (5 mg total) by mouth as  directed.  30 tablet  3  . DISCONTD: furosemide (LASIX) 40 MG tablet Take one in the early morning and one in the early afternoon for 4 days, then back to just one each morning  30 tablet    . digoxin (LANOXIN) 0.125 MG tablet Take 1 tablet (0.125 mg total) by mouth daily.  30 tablet  3    No Known Allergies  Past Medical History  Diagnosis Date  . Diabetes mellitus   . Hypertension   . Systolic heart failure     EF is 25 to 30%  . Noncompliance   . CAD (coronary artery disease) Sept 2013    s/p cardiac cath showing occlusion of small RCA with collaterals  . Chronic anticoagulation     on coumadin  . Atrial fibrillation     Past Surgical History  Procedure Date  . S/p cardiac cath Sept 2013    History  Smoking status  . Former Smoker -- 0.5 packs/day  . Quit date: 09/15/2011  Smokeless tobacco  . Never Used  Comment: Stopped 10-08-11    History  Alcohol Use No    Family History  Problem Relation Age of Onset  . Diabetes Mother     Review of Systems: The review of systems is per the HPI.  All other systems were reviewed and are negative.  Physical Exam: BP 130/80  Pulse 110  Ht 5\' 5"  (1.651 m)  Wt 228 lb 12.8 oz (103.783 kg)  BMI 38.07 kg/m2 Patient is pleasant and in no acute distress. Weight is up 3 more pounds. Skin is warm and dry. Color is normal.  HEENT is unremarkable. Normocephalic/atraumatic. PERRL. Sclera are nonicteric. Neck is supple. No masses. No JVD. Lungs are clear after coughing. Cardiac exam shows an irregular rate and rhythm. He is tachycardic on my exam. Abdomen is soft. Extremities are with 1 to 2+ pitting edema. Gait and ROM are intact. No gross neurologic deficits noted.  LABORATORY DATA: INR is pending.   BMET and BNP are pending.   Lab Results  Component Value Date   WBC 7.0 12/05/2011   HGB 14.0 12/05/2011   HCT 44.3 12/05/2011   PLT 197 12/05/2011   GLUCOSE 290* 12/30/2011   CHOL 114 12/01/2011   TRIG 51 12/01/2011   HDL 33* 12/01/2011    LDLCALC 71 12/01/2011   ALT 17 12/02/2011   AST 15 12/02/2011   NA 134* 12/30/2011   K 4.1 12/30/2011   CL 96 12/30/2011   CREATININE 1.0 12/30/2011   BUN 24* 12/30/2011   CO2 30 12/30/2011   TSH 1.519 10/01/2011   INR 2.2 12/30/2011   HGBA1C 7.7* 12/01/2011    Assessment / Plan:  1. Systolic heart failure - EF is 25%. He continues to have evidence of volume overload. I have increased his Lasix again. We will check BNP and BMET today as well.  2. Atrial fib - rate is still up. We are checking his INR today (no showed his last appointment). I have added Lanoxin 0.125 mg daily for rate control/CHF.   Dr. Elease Hashimoto will see him back in about 3 weeks. Salt restriction is imperative. Medicines are changed as noted above.   Patient is agreeable to this plan and will call if any problems develop in the interim.

## 2012-01-20 ENCOUNTER — Ambulatory Visit (INDEPENDENT_AMBULATORY_CARE_PROVIDER_SITE_OTHER): Payer: Medicaid Other

## 2012-01-20 DIAGNOSIS — I4891 Unspecified atrial fibrillation: Secondary | ICD-10-CM

## 2012-01-20 DIAGNOSIS — Z7901 Long term (current) use of anticoagulants: Secondary | ICD-10-CM

## 2012-02-03 ENCOUNTER — Ambulatory Visit (INDEPENDENT_AMBULATORY_CARE_PROVIDER_SITE_OTHER): Payer: Medicaid Other | Admitting: *Deleted

## 2012-02-03 DIAGNOSIS — I4891 Unspecified atrial fibrillation: Secondary | ICD-10-CM

## 2012-02-03 DIAGNOSIS — Z7901 Long term (current) use of anticoagulants: Secondary | ICD-10-CM

## 2012-02-03 LAB — POCT INR: INR: 2

## 2012-02-11 ENCOUNTER — Ambulatory Visit: Payer: Self-pay | Admitting: Cardiovascular Disease

## 2012-02-17 ENCOUNTER — Ambulatory Visit (INDEPENDENT_AMBULATORY_CARE_PROVIDER_SITE_OTHER): Payer: Medicaid Other | Admitting: Nurse Practitioner

## 2012-02-17 ENCOUNTER — Ambulatory Visit (INDEPENDENT_AMBULATORY_CARE_PROVIDER_SITE_OTHER): Payer: Medicaid Other

## 2012-02-17 ENCOUNTER — Encounter: Payer: Self-pay | Admitting: Nurse Practitioner

## 2012-02-17 VITALS — BP 110/68 | HR 64 | Ht 67.0 in | Wt 216.8 lb

## 2012-02-17 DIAGNOSIS — I5022 Chronic systolic (congestive) heart failure: Secondary | ICD-10-CM

## 2012-02-17 DIAGNOSIS — Z7901 Long term (current) use of anticoagulants: Secondary | ICD-10-CM

## 2012-02-17 DIAGNOSIS — I4891 Unspecified atrial fibrillation: Secondary | ICD-10-CM

## 2012-02-17 LAB — BASIC METABOLIC PANEL
BUN: 24 mg/dL — ABNORMAL HIGH (ref 6–23)
CO2: 29 mEq/L (ref 19–32)
Calcium: 9.2 mg/dL (ref 8.4–10.5)
Chloride: 98 mEq/L (ref 96–112)
Creatinine, Ser: 0.8 mg/dL (ref 0.4–1.5)
GFR: 103.67 mL/min (ref >60.00)
Glucose, Bld: 304 mg/dL — ABNORMAL HIGH (ref 70–99)
Potassium: 3.8 mEq/L (ref 3.5–5.1)
Sodium: 133 mEq/L — ABNORMAL LOW (ref 135–145)

## 2012-02-17 NOTE — Patient Instructions (Addendum)
I think you are doing well.  We need to check lab today  We need to get another ultrasound of your heart in about a month and see Dr. Elease Hashimoto afterwards to discuss  Stay on your current medicines  Avoid salt  Call the [facility] office at [phone] if you have any questions, problems or concerns.

## 2012-02-17 NOTE — Progress Notes (Signed)
Jacob [name] Date of Birth: 04-Feb-1956 Medical Record #[id number]  History of Present Illness: Jacob [name] is seen back today for a one month check. He is seen for Dr. Elease Hashimoto. He has systolic heart failure. EF is 15 to 20%. He is on target doses of his CHF regimen. Has atrial fib as well. On coumadin. 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   He comes in today. He is here with the interpreter again but he seems to understand pretty good. He says he is feeling much better since his last visit. Not short of breath. No swelling. Has lost weight. Not smoking but he smells of tobacco. Says he goes to the hookah club every night and plays cards but does not smoke.  Says he is taking his medicines. No chest pain.   Current Outpatient Prescriptions on File Prior to Visit  Medication Sig Dispense Refill  . aspirin 81 MG tablet Take 81 mg by mouth daily.      Marland Kitchen atorvastatin (LIPITOR) 80 MG tablet Take 1 tablet (80 mg total) by mouth daily at 6 PM.  90 tablet  3  . carvedilol (COREG) 25 MG tablet Take 1.5 tablets (37.5 mg total) by mouth 2 (two) times daily with a meal.  60 tablet  3  . digoxin (LANOXIN) 0.125 MG tablet Take 1 tablet (0.125 mg total) by mouth daily.  30 tablet  3  . furosemide (LASIX) 40 MG tablet Take two tablets in the early morning and one in the early afternoon  90 tablet  6  . glipiZIDE (GLUCOTROL) 5 MG tablet Take 5 mg by mouth daily.      Marland Kitchen lisinopril (PRINIVIL,ZESTRIL) 10 MG tablet Take 1 tablet (10 mg total) by mouth daily.  30 tablet  3  . nitroGLYCERIN (NITROSTAT) 0.4 MG SL tablet Place 1 tablet (0.4 mg total) under the tongue every 5 (five) minutes as needed for chest pain.  10 tablet  12  . spironolactone (ALDACTONE) 25 MG tablet Take 1 tablet (25 mg total) by mouth daily.  30 tablet  3  . warfarin (COUMADIN) 5 MG tablet Take 1  tablet (5 mg total) by mouth as directed.  30 tablet  3    No Known Allergies  Past Medical History  Diagnosis Date  . Diabetes mellitus   . Hypertension   . Systolic heart failure     EF is 25 to 30%  . Noncompliance   . CAD (coronary artery disease) Sept 2013    s/p cardiac cath showing occlusion of small RCA with collaterals  . Chronic anticoagulation     on coumadin  . Atrial fibrillation     Past Surgical History  Procedure Date  . S/p cardiac cath Sept 2013    History  Smoking status  . Former Smoker -- 0.5 packs/day  . Quit date: 09/15/2011  Smokeless tobacco  . Never Used    Comment: Stopped 10-08-11    History  Alcohol Use No    Family History  Problem Relation Age of Onset  . Diabetes Mother     Review of Systems: The review of systems is per the HPI.  All other systems were reviewed and are negative.  Physical Exam: BP 110/68  Pulse 64  Ht 5\' 7"  (1.702 m)  Wt 216 lb 12.8 oz (98.34 kg)  BMI 33.96 kg/m2 Patient is very pleasant and  in no acute distress. Skin is warm and dry. Color is normal.  HEENT is unremarkable. Normocephalic/atraumatic. PERRL. Sclera are nonicteric. Neck is supple. No masses. No JVD. Lungs are clear. Cardiac exam shows a regular rate and rhythm. Abdomen is soft. Extremities are without edema. Gait and ROM are intact. No gross neurologic deficits noted.   LABORATORY DATA: BMET is pending for today.   Lab Results  Component Value Date   WBC 7.0 12/05/2011   HGB 14.0 12/05/2011   HCT 44.3 12/05/2011   PLT 197 12/05/2011   GLUCOSE 201* 01/13/2012   CHOL 114 12/01/2011   TRIG 51 12/01/2011   HDL 33* 12/01/2011   LDLCALC 71 12/01/2011   ALT 17 12/02/2011   AST 15 12/02/2011   NA 139 01/13/2012   K 3.8 01/13/2012   CL 99 01/13/2012   CREATININE 0.9 01/13/2012   BUN 20 01/13/2012   CO2 35* 01/13/2012   TSH 1.519 10/01/2011   INR 1.7 02/17/2012   HGBA1C 7.7* 12/01/2011   Echo Study Conclusions from September 2013  - Left ventricle: The  cavity size was mildly dilated. There was mild to moderate concentric hypertrophy. Systolic function was severely reduced. The estimated ejection fraction was in the range of 15% to 20%. Severe diffuse hypokinesis. - Aortic valve: Mildly calcified annulus. - Left atrium: The atrium was moderately dilated. - Right ventricle: The cavity size was at the upper limits of normal. Wall thickness was moderately increased. Systolic function was mildly to moderately reduced. - Right atrium: The atrium was mildly to moderately dilated. - Pulmonary arteries: PA peak pressure: 45mm Hg (S). - Pericardium, extracardiac: A small pericardial effusion was identified posterior to the heart, along the right ventricular free wall, and along the right atrial free wall.  Assessment / Plan: 1. Systolic heart failure - EF was 15 to 20% per echo back in September. He is now on more optimal therapy. Looks much better clinically. Weight is down. No CHF symptoms to report. Will recheck a BMET today. We will arrange for a repeat echo in about a month. See Dr. Elease Hashimoto back for discussion. May require ICD implant.   2. Atrial fib - rate is more controlled. Remains on his coumadin. He has moderate LAE. I suspect he would require antiarrhythmic therapy if we were going to try and restore sinus rhythm. May just need to manage with rate control and anticoagulation. Compliance could still be an issue. Disposition per Dr. Elease Hashimoto. Would check a digoxin level on return.   3. HTN - satisfactory with his current regimen.   Patient is agreeable to this plan and will call if any problems develop in the interim.

## 2012-03-09 ENCOUNTER — Ambulatory Visit (HOSPITAL_COMMUNITY): Payer: Medicaid Other | Attending: Cardiology | Admitting: Radiology

## 2012-03-09 ENCOUNTER — Ambulatory Visit (INDEPENDENT_AMBULATORY_CARE_PROVIDER_SITE_OTHER): Payer: Medicaid Other | Admitting: *Deleted

## 2012-03-09 DIAGNOSIS — I509 Heart failure, unspecified: Secondary | ICD-10-CM | POA: Insufficient documentation

## 2012-03-09 DIAGNOSIS — I5022 Chronic systolic (congestive) heart failure: Secondary | ICD-10-CM

## 2012-03-09 DIAGNOSIS — I5020 Unspecified systolic (congestive) heart failure: Secondary | ICD-10-CM | POA: Insufficient documentation

## 2012-03-09 DIAGNOSIS — F17200 Nicotine dependence, unspecified, uncomplicated: Secondary | ICD-10-CM | POA: Insufficient documentation

## 2012-03-09 DIAGNOSIS — I4891 Unspecified atrial fibrillation: Secondary | ICD-10-CM | POA: Insufficient documentation

## 2012-03-09 DIAGNOSIS — I10 Essential (primary) hypertension: Secondary | ICD-10-CM | POA: Insufficient documentation

## 2012-03-09 DIAGNOSIS — Z7901 Long term (current) use of anticoagulants: Secondary | ICD-10-CM

## 2012-03-09 DIAGNOSIS — I2510 Atherosclerotic heart disease of native coronary artery without angina pectoris: Secondary | ICD-10-CM | POA: Insufficient documentation

## 2012-03-09 DIAGNOSIS — E119 Type 2 diabetes mellitus without complications: Secondary | ICD-10-CM | POA: Insufficient documentation

## 2012-03-09 DIAGNOSIS — I517 Cardiomegaly: Secondary | ICD-10-CM | POA: Insufficient documentation

## 2012-03-09 DIAGNOSIS — E669 Obesity, unspecified: Secondary | ICD-10-CM | POA: Insufficient documentation

## 2012-03-09 LAB — POCT INR: INR: 1.6

## 2012-03-09 NOTE — Progress Notes (Signed)
Echocardiogram performed.  

## 2012-03-11 ENCOUNTER — Ambulatory Visit: Payer: Medicaid Other | Admitting: Cardiovascular Disease

## 2012-03-13 ENCOUNTER — Encounter: Payer: Self-pay | Admitting: Cardiovascular Disease

## 2012-03-24 ENCOUNTER — Ambulatory Visit (INDEPENDENT_AMBULATORY_CARE_PROVIDER_SITE_OTHER): Payer: Medicaid Other | Admitting: *Deleted

## 2012-03-24 DIAGNOSIS — I4891 Unspecified atrial fibrillation: Secondary | ICD-10-CM

## 2012-03-24 DIAGNOSIS — Z7901 Long term (current) use of anticoagulants: Secondary | ICD-10-CM

## 2012-03-25 DIAGNOSIS — I219 Acute myocardial infarction, unspecified: Secondary | ICD-10-CM

## 2012-03-25 HISTORY — DX: Acute myocardial infarction, unspecified: I21.9

## 2012-04-08 ENCOUNTER — Encounter: Payer: Self-pay | Admitting: Cardiovascular Disease

## 2012-04-13 ENCOUNTER — Ambulatory Visit (INDEPENDENT_AMBULATORY_CARE_PROVIDER_SITE_OTHER): Payer: Medicaid Other | Admitting: Pharmacist

## 2012-04-13 DIAGNOSIS — Z7901 Long term (current) use of anticoagulants: Secondary | ICD-10-CM

## 2012-04-13 DIAGNOSIS — I4891 Unspecified atrial fibrillation: Secondary | ICD-10-CM

## 2012-04-13 MED ORDER — WARFARIN SODIUM 5 MG PO TABS: ORAL_TABLET | ORAL | Status: DC

## 2012-05-05 ENCOUNTER — Ambulatory Visit (INDEPENDENT_AMBULATORY_CARE_PROVIDER_SITE_OTHER): Payer: Medicaid Other | Admitting: *Deleted

## 2012-05-05 DIAGNOSIS — Z7901 Long term (current) use of anticoagulants: Secondary | ICD-10-CM

## 2012-05-05 DIAGNOSIS — I4891 Unspecified atrial fibrillation: Secondary | ICD-10-CM

## 2012-05-05 LAB — POCT INR: INR: 2.4

## 2012-05-17 ENCOUNTER — Encounter (HOSPITAL_COMMUNITY): Payer: Self-pay | Admitting: *Deleted

## 2012-05-17 ENCOUNTER — Emergency Department (HOSPITAL_COMMUNITY)
Admission: EM | Admit: 2012-05-17 | Discharge: 2012-05-17 | Disposition: A | Discharge status: Home / Self Care | Payer: Medicaid Other | Attending: Emergency Medicine | Admitting: Emergency Medicine

## 2012-05-17 ENCOUNTER — Emergency Department (HOSPITAL_COMMUNITY): Payer: Medicaid Other

## 2012-05-17 DIAGNOSIS — I4891 Unspecified atrial fibrillation: Secondary | ICD-10-CM | POA: Insufficient documentation

## 2012-05-17 DIAGNOSIS — I5022 Chronic systolic (congestive) heart failure: Secondary | ICD-10-CM | POA: Insufficient documentation

## 2012-05-17 DIAGNOSIS — I10 Essential (primary) hypertension: Secondary | ICD-10-CM | POA: Insufficient documentation

## 2012-05-17 DIAGNOSIS — R110 Nausea: Secondary | ICD-10-CM | POA: Insufficient documentation

## 2012-05-17 DIAGNOSIS — Z79899 Other long term (current) drug therapy: Secondary | ICD-10-CM | POA: Insufficient documentation

## 2012-05-17 DIAGNOSIS — D689 Coagulation defect, unspecified: Secondary | ICD-10-CM | POA: Insufficient documentation

## 2012-05-17 DIAGNOSIS — Z7982 Long term (current) use of aspirin: Secondary | ICD-10-CM | POA: Insufficient documentation

## 2012-05-17 DIAGNOSIS — I2510 Atherosclerotic heart disease of native coronary artery without angina pectoris: Secondary | ICD-10-CM | POA: Insufficient documentation

## 2012-05-17 DIAGNOSIS — K5900 Constipation, unspecified: Secondary | ICD-10-CM | POA: Insufficient documentation

## 2012-05-17 DIAGNOSIS — R109 Unspecified abdominal pain: Secondary | ICD-10-CM | POA: Insufficient documentation

## 2012-05-17 DIAGNOSIS — E119 Type 2 diabetes mellitus without complications: Secondary | ICD-10-CM | POA: Insufficient documentation

## 2012-05-17 DIAGNOSIS — Z91199 Patient's noncompliance with other medical treatment and regimen due to unspecified reason: Secondary | ICD-10-CM | POA: Insufficient documentation

## 2012-05-17 DIAGNOSIS — F17200 Nicotine dependence, unspecified, uncomplicated: Secondary | ICD-10-CM | POA: Insufficient documentation

## 2012-05-17 DIAGNOSIS — Z7901 Long term (current) use of anticoagulants: Secondary | ICD-10-CM | POA: Insufficient documentation

## 2012-05-17 LAB — POCT I-STAT, CHEM 8
BUN: 29 mg/dL — ABNORMAL HIGH (ref 6–23)
Creatinine, Ser: 1 mg/dL (ref 0.50–1.35)
Glucose, Bld: 152 mg/dL — ABNORMAL HIGH (ref 70–99)
Potassium: 4.9 mEq/L (ref 3.5–5.1)
Sodium: 134 mEq/L — ABNORMAL LOW (ref 135–145)
TCO2: 27 mmol/L (ref 0–100)

## 2012-05-17 LAB — CBC WITH DIFFERENTIAL/PLATELET
Eosinophils Absolute: 0.2 10*3/uL (ref 0.0–0.7)
Hemoglobin: 9.7 g/dL — ABNORMAL LOW (ref 13.0–17.0)
Lymphocytes Relative: 16 % (ref 12–46)
Lymphs Abs: 2.3 10*3/uL (ref 0.7–4.0)
Monocytes Relative: 8 % (ref 3–12)
Neutro Abs: 10.4 10*3/uL — ABNORMAL HIGH (ref 1.7–7.7)
Neutrophils Relative %: 73 % (ref 43–77)
Platelets: 251 10*3/uL (ref 150–400)
RBC: 3.97 MIL/uL — ABNORMAL LOW (ref 4.22–5.81)
WBC: 14.2 10*3/uL — ABNORMAL HIGH (ref 4.0–10.5)

## 2012-05-17 LAB — COMPREHENSIVE METABOLIC PANEL
ALT: 17 U/L (ref 0–53)
Alkaline Phosphatase: 117 U/L (ref 39–117)
Chloride: 94 mEq/L — ABNORMAL LOW (ref 96–112)
GFR calc Af Amer: >90 mL/min (ref >90)
Glucose, Bld: 155 mg/dL — ABNORMAL HIGH (ref 70–99)
Potassium: 4.9 mEq/L (ref 3.5–5.1)
Sodium: 130 mEq/L — ABNORMAL LOW (ref 135–145)
Total Bilirubin: 0.2 mg/dL — ABNORMAL LOW (ref 0.3–1.2)
Total Protein: 7.2 g/dL (ref 6.0–8.3)

## 2012-05-17 LAB — LACTIC ACID, PLASMA: Lactic Acid, Venous: 1.1 mmol/L (ref 0.5–2.2)

## 2012-05-17 LAB — POCT I-STAT TROPONIN I

## 2012-05-17 MED ORDER — IOHEXOL 300 MG/ML  SOLN
50.0000 mL | Freq: Once | INTRAMUSCULAR | Status: AC | PRN
  Administered 2012-05-17: 50 mL via ORAL

## 2012-05-17 MED ORDER — POLYETHYLENE GLYCOL 3350 17 GM/SCOOP PO POWD: 17.0000 g | Freq: Every day | ORAL | Status: DC

## 2012-05-17 MED ORDER — SODIUM CHLORIDE 0.9 % IV BOLUS (SEPSIS)
1000.0000 mL | Freq: Once | INTRAVENOUS | Status: AC
  Administered 2012-05-17: 1000 mL via INTRAVENOUS

## 2012-05-17 MED ORDER — ONDANSETRON HCL 4 MG/2ML IJ SOLN
4.0000 mg | Freq: Once | INTRAMUSCULAR | Status: AC
  Administered 2012-05-17: 4 mg via INTRAVENOUS
  Filled 2012-05-17: qty 2

## 2012-05-17 MED ORDER — IOHEXOL 300 MG/ML  SOLN
100.0000 mL | Freq: Once | INTRAMUSCULAR | Status: AC | PRN
  Administered 2012-05-17: 100 mL via INTRAVENOUS

## 2012-05-17 MED ORDER — FENTANYL CITRATE 0.05 MG/ML IJ SOLN
50.0000 ug | INTRAMUSCULAR | Status: DC | PRN
  Administered 2012-05-17: 50 ug via INTRAVENOUS
  Filled 2012-05-17: qty 2

## 2012-05-17 NOTE — ED Notes (Signed)
Patient has completed contrast CT notified.

## 2012-05-17 NOTE — ED Notes (Signed)
[id number]<AV> Expected date:<BR> Expected time:<BR> Means of arrival:<BR> Comments:<BR> Triage 6

## 2012-05-17 NOTE — ED Notes (Signed)
Patient transported to X-ray 

## 2012-05-17 NOTE — ED Notes (Signed)
Patient called nurse to bedside. Patient requesting medication to make him "throw-up" or "poop". Patient asking why he is having "belly pain". Patient reminded that blood work that was being drawn will help to determine what is causing his pain.

## 2012-05-17 NOTE — ED Notes (Addendum)
Patient with c/o SOB, abd pain, states he has not been able to have a BM in 2 days. Patient also states he frequently feels like he needs to vomit but is unable to dp so. Patient hands and feet cold, dusky. Cap refill @4sec . Strong radial and pedal pulses. Patient placed on 2LNC O2. Patient Afib on cardiac monitor. Patient + for hx Afib.

## 2012-05-17 NOTE — ED Provider Notes (Signed)
History     CSN: [id number]  Arrival date & time 05/17/12  7253   First MD Initiated Contact with Patient 05/17/12 0423      Chief Complaint  Patient presents with  . Shortness of Breath  . Constipation    (Consider location/radiation/quality/duration/timing/severity/associated sxs/prior treatment) HPI History per patient. Upper abdominal pain and discomfort for the last 5 days. Has not had a bowel movement in a week. Patient requesting something for discomfort and something to make him have a bowel movement. He has associated nausea but no vomiting. No history of same. Is on Coumadin for chronic A. fib and has history of heart disease. No history of gallbladder problems. No fevers. Symptoms do not seem to be associated with eating. Moderate in severity and worsening. Symptoms constant since onset. No previous blood in stools. PT very anxious that he is unable to move his bowels.  Past Medical History  Diagnosis Date  . Diabetes mellitus   . Hypertension   . Systolic heart failure     EF is 25 to 30%  . Noncompliance   . CAD (coronary artery disease) Sept 2013    s/p cardiac cath showing occlusion of small RCA with collaterals  . Chronic anticoagulation     on coumadin  . Atrial fibrillation     Past Surgical History  Procedure Laterality Date  . S/p cardiac cath  Sept 2013    Family History  Problem Relation Age of Onset  . Diabetes Mother     History  Substance Use Topics  . Smoking status: Current Every Day Smoker -- 0.50 packs/day  . Smokeless tobacco: Never Used     Comment: Stopped 10-08-11  . Alcohol Use: No      Review of Systems  Constitutional: Negative for fever and chills.  HENT: Negative for neck pain and neck stiffness.   Eyes: Negative for pain.  Respiratory: Negative for shortness of breath.   Cardiovascular: Negative for chest pain.  Gastrointestinal: Positive for abdominal pain and constipation.  Genitourinary: Negative for dysuria.   Musculoskeletal: Negative for back pain.  Skin: Negative for rash.  Neurological: Negative for headaches.  All other systems reviewed and are negative.    Allergies  Review of patient's allergies indicates no known allergies.  Home Medications   Current Outpatient Rx  Name  Route  Sig  Dispense  Refill  . aspirin EC 81 MG tablet   Oral   Take 81 mg by mouth daily after lunch.          Marland Kitchen atorvastatin (LIPITOR) 80 MG tablet   Oral   Take 80 mg by mouth daily after lunch.         . carvedilol (COREG) 25 MG tablet   Oral   Take 37.5 mg by mouth daily after lunch.         . digoxin (LANOXIN) 0.125 MG tablet   Oral   Take 0.125 mg by mouth daily after lunch.         . furosemide (LASIX) 40 MG tablet   Oral   Take 40 mg by mouth daily after lunch.          Marland Kitchen glipiZIDE (GLUCOTROL) 5 MG tablet   Oral   Take 5 mg by mouth daily after lunch.          . lisinopril (PRINIVIL,ZESTRIL) 10 MG tablet   Oral   Take 10 mg by mouth daily after lunch.         Marland Kitchen  spironolactone (ALDACTONE) 25 MG tablet   Oral   Take 25 mg by mouth daily after lunch.         . warfarin (COUMADIN) 5 MG tablet   Oral   Take 5 mg by mouth daily after lunch.          . nitroGLYCERIN (NITROSTAT) 0.4 MG SL tablet   Sublingual   Place 1 tablet (0.4 mg total) under the tongue every 5 (five) minutes as needed for chest pain.   10 tablet   12     BP 119/58  Pulse 69  Temp(Src) 98.2 F (36.8 C) (Oral)  Resp 17  SpO2 98%  Physical Exam  Constitutional: He is oriented to person, place, and time. He appears well-developed and well-nourished.  HENT:  Head: Normocephalic and atraumatic.  Eyes: EOM are normal. Pupils are equal, round, and reactive to light.  Neck: Neck supple.  Cardiovascular: Regular rhythm and intact distal pulses.   Pulmonary/Chest: Effort normal. No respiratory distress.  Abdominal: Soft. Bowel sounds are normal. He exhibits no distension. There is no  tenderness. There is no rebound and no guarding.  Localizes discomfort epigastric region without reproducible tenderness. Negative Murphy sign  Musculoskeletal: Normal range of motion. He exhibits no edema.  Neurological: He is alert and oriented to person, place, and time.  Skin: Skin is warm and dry.    ED Course  Procedures (including critical care time)  Results for orders placed during the hospital encounter of 05/17/12  CBC WITH DIFFERENTIAL      Result Value Range   WBC 14.2 (*) 4.0 - 10.5 K/uL   RBC 3.97 (*) 4.22 - 5.81 MIL/uL   Hemoglobin 9.7 (*) 13.0 - 17.0 g/dL   HCT 56.2 (*) 13.0 - 86.5 %   MCV 76.8 (*) 78.0 - 100.0 fL   MCH 24.4 (*) 26.0 - 34.0 pg   MCHC 31.8  30.0 - 36.0 g/dL   RDW 78.4 (*) 69.6 - 29.5 %   Platelets 251  150 - 400 K/uL   Neutrophils Relative 73  43 - 77 %   Neutro Abs 10.4 (*) 1.7 - 7.7 K/uL   Lymphocytes Relative 16  12 - 46 %   Lymphs Abs 2.3  0.7 - 4.0 K/uL   Monocytes Relative 8  3 - 12 %   Monocytes Absolute 1.2 (*) 0.1 - 1.0 K/uL   Eosinophils Relative 2  0 - 5 %   Eosinophils Absolute 0.2  0.0 - 0.7 K/uL   Basophils Relative 1  0 - 1 %   Basophils Absolute 0.1  0.0 - 0.1 K/uL  COMPREHENSIVE METABOLIC PANEL      Result Value Range   Sodium 130 (*) 135 - 145 mEq/L   Potassium 4.9  3.5 - 5.1 mEq/L   Chloride 94 (*) 96 - 112 mEq/L   CO2 28  19 - 32 mEq/L   Glucose, Bld 155 (*) 70 - 99 mg/dL   BUN 28 (*) 6 - 23 mg/dL   Creatinine, Ser 2.84  0.50 - 1.35 mg/dL   Calcium 8.5  8.4 - 13.2 mg/dL   Total Protein 7.2  6.0 - 8.3 g/dL   Albumin 3.3 (*) 3.5 - 5.2 g/dL   AST 16  0 - 37 U/L   ALT 17  0 - 53 U/L   Alkaline Phosphatase 117  39 - 117 U/L   Total Bilirubin 0.2 (*) 0.3 - 1.2 mg/dL   GFR calc non Af Amer >90  >  90 mL/min   GFR calc Af Amer >90  >90 mL/min  LIPASE, BLOOD      Result Value Range   Lipase 50  11 - 59 U/L  PROTIME-INR      Result Value Range   Prothrombin Time 29.0 (*) 11.6 - 15.2 seconds   INR 2.92 (*) 0.00 - 1.49   LACTIC ACID, PLASMA      Result Value Range   Lactic Acid, Venous 1.1  0.5 - 2.2 mmol/L  POCT I-STAT, CHEM 8      Result Value Range   Sodium 134 (*) 135 - 145 mEq/L   Potassium 4.9  3.5 - 5.1 mEq/L   Chloride 98  96 - 112 mEq/L   BUN 29 (*) 6 - 23 mg/dL   Creatinine, Ser 1.61  0.50 - 1.35 mg/dL   Glucose, Bld 096 (*) 70 - 99 mg/dL   Calcium, Ion 0.45 (*) 1.12 - 1.23 mmol/L   TCO2 27  0 - 100 mmol/L   Hemoglobin 10.9 (*) 13.0 - 17.0 g/dL   HCT 40.9 (*) 81.1 - 91.4 %  POCT I-STAT TROPONIN I      Result Value Range   Troponin i, poc 0.03  0.00 - 0.08 ng/mL   Comment 3            Dg Chest 2 View  05/17/2012  *RADIOLOGY REPORT*  Clinical Data: Epigastric pain.  Shortness of breath. Constipation.  CHEST - 2 VIEW  Comparison: 12/01/2011  Findings: Mild cardiac enlargement to.  Borderline pulmonary vascular congestion.  This represents improvement since previous study.  Pleural effusions have resolved.  No airspace disease or consolidation in the lungs.  No pneumothorax.  Mediastinal contours appear intact.  IMPRESSION: Mild cardiac enlargement with borderline pulmonary vascular congestion.  No edema or effusions.   Original Report Authenticated By: Burman Nieves, M.D.    Ct Abdomen Pelvis W Contrast  05/17/2012  *RADIOLOGY REPORT*  Clinical Data: Abdominal pain.  Nausea and shortness of breath.  CT ABDOMEN AND PELVIS WITH CONTRAST  Technique:  Multidetector CT imaging of the abdomen and pelvis was performed following the standard protocol during bolus administration of intravenous contrast.  Contrast: 50mL OMNIPAQUE IOHEXOL 300 MG/ML  SOLN, OMNIPAQUE IOHEXOL 300 MG/ML  SOLN  Comparison: None.  Findings: The lung bases are clear.  Coronary artery calcifications.  The liver, spleen, gallbladder, pancreas, and retroperitoneal lymph nodes are unremarkable.  Calcification of the abdominal aorta without aneurysm.  Prominence of the adrenal glands bilaterally suggesting hyperplasia with possible  small adenoma in the right upper adrenal measuring 12 mm diameter.  There is a simple appearing cyst in the upper pole of the right kidney measuring 3.7 cm diameter.  No solid mass or hydronephrosis in either kidney. The stomach, small bowel, and colon are not abnormally distended. No wall thickening is appreciated.  No free air or free fluid in the abdomen.  Pelvis:  The prostate gland is not enlarged.  The bladder wall is not thickened.  No free or loculated pelvic fluid collections.  The appendix is normal.  No significant pelvic lymphadenopathy. Degenerative changes in the lumbar spine with normal alignment. 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  IMPRESSION: No acute process demonstrated in the abdomen or pelvis to account for the patient's symptoms.   Original Report Authenticated By: Burman Nieves, M.D.     Dg Abd 2 Views  05/17/2012  *RADIOLOGY REPORT*  Clinical Data: Epigastric pain and constipation for 5 days.  ABDOMEN -  2 VIEW  Comparison: None.  Findings: Scattered gas and stool in the colon.  No small or large bowel distension.  No free intra-abdominal air.  No abnormal air fluid levels.  No radiopaque stones.  Visualized bones appear intact except for degenerative changes.  Vascular calcifications.  IMPRESSION: Nonobstructive bowel gas pattern.   Original Report Authenticated By: Burman Nieves, M.D.     Date: 05/17/2012  Rate: 86  Rhythm: atrial fibrillation  QRS Axis: normal  Intervals: normal  ST/T Wave abnormalities: nonspecific ST changes  Conduction Disutrbances:none  Narrative Interpretation:   Old EKG Reviewed: none available  IV fluids. IV fentanyl  Serial evaluations. Pain improved and plan d/c home with Rx Miralax and close outpatient follow up.  MDM  ABD pain constipation, evaluated with CT scan and labs and ECG as above  IVFs and IV narcotics  VS and nursing notes reviewed and considered.   RX and precautions provided.       Sunnie Nielsen, MD 05/17/12 2258

## 2012-05-17 NOTE — ED Notes (Signed)
Patient transported to CT 

## 2012-06-03 ENCOUNTER — Ambulatory Visit (INDEPENDENT_AMBULATORY_CARE_PROVIDER_SITE_OTHER): Payer: Medicaid Other | Admitting: *Deleted

## 2012-06-03 DIAGNOSIS — Z7901 Long term (current) use of anticoagulants: Secondary | ICD-10-CM

## 2012-06-03 DIAGNOSIS — I4891 Unspecified atrial fibrillation: Secondary | ICD-10-CM

## 2012-06-25 ENCOUNTER — Ambulatory Visit: Payer: Medicaid Other | Admitting: Cardiovascular Disease

## 2012-07-10 ENCOUNTER — Encounter: Payer: Self-pay | Admitting: Cardiovascular Disease

## 2012-08-25 ENCOUNTER — Ambulatory Visit: Payer: Self-pay | Admitting: Pharmacist

## 2012-08-25 DIAGNOSIS — I4891 Unspecified atrial fibrillation: Secondary | ICD-10-CM

## 2012-08-25 DIAGNOSIS — Z7901 Long term (current) use of anticoagulants: Secondary | ICD-10-CM

## 2012-09-22 HISTORY — PX: CORONARY ARTERY BYPASS GRAFT: SHX141

## 2012-09-22 HISTORY — PX: CARDIAC CATHETERIZATION: SHX172

## 2013-01-07 ENCOUNTER — Encounter (HOSPITAL_COMMUNITY): Payer: Self-pay | Admitting: Emergency Medicine

## 2013-01-07 ENCOUNTER — Emergency Department (HOSPITAL_COMMUNITY): Payer: Medicaid - Out of State

## 2013-01-07 ENCOUNTER — Inpatient Hospital Stay (HOSPITAL_COMMUNITY)
Admission: EM | Admit: 2013-01-07 | Discharge: 2013-01-12 | DRG: 293 | Disposition: A | Discharge status: Home / Self Care | Payer: Medicaid - Out of State | Attending: Cardiology | Admitting: Cardiology

## 2013-01-07 DIAGNOSIS — E119 Type 2 diabetes mellitus without complications: Secondary | ICD-10-CM | POA: Diagnosis present

## 2013-01-07 DIAGNOSIS — I509 Heart failure, unspecified: Secondary | ICD-10-CM | POA: Diagnosis present

## 2013-01-07 DIAGNOSIS — R0789 Other chest pain: Secondary | ICD-10-CM | POA: Diagnosis present

## 2013-01-07 DIAGNOSIS — F17200 Nicotine dependence, unspecified, uncomplicated: Secondary | ICD-10-CM | POA: Diagnosis present

## 2013-01-07 DIAGNOSIS — R079 Chest pain, unspecified: Secondary | ICD-10-CM

## 2013-01-07 DIAGNOSIS — Z951 Presence of aortocoronary bypass graft: Secondary | ICD-10-CM

## 2013-01-07 DIAGNOSIS — Z5900 Homelessness unspecified: Secondary | ICD-10-CM

## 2013-01-07 DIAGNOSIS — I10 Essential (primary) hypertension: Secondary | ICD-10-CM | POA: Diagnosis present

## 2013-01-07 DIAGNOSIS — I2510 Atherosclerotic heart disease of native coronary artery without angina pectoris: Secondary | ICD-10-CM | POA: Diagnosis present

## 2013-01-07 DIAGNOSIS — I5042 Chronic combined systolic (congestive) and diastolic (congestive) heart failure: Secondary | ICD-10-CM | POA: Diagnosis present

## 2013-01-07 DIAGNOSIS — Z9119 Patient's noncompliance with other medical treatment and regimen: Secondary | ICD-10-CM

## 2013-01-07 DIAGNOSIS — I5023 Acute on chronic systolic (congestive) heart failure: Secondary | ICD-10-CM

## 2013-01-07 DIAGNOSIS — I4891 Unspecified atrial fibrillation: Secondary | ICD-10-CM | POA: Diagnosis present

## 2013-01-07 DIAGNOSIS — I5043 Acute on chronic combined systolic (congestive) and diastolic (congestive) heart failure: Principal | ICD-10-CM | POA: Diagnosis present

## 2013-01-07 DIAGNOSIS — D509 Iron deficiency anemia, unspecified: Secondary | ICD-10-CM

## 2013-01-07 DIAGNOSIS — Z7901 Long term (current) use of anticoagulants: Secondary | ICD-10-CM

## 2013-01-07 DIAGNOSIS — I482 Chronic atrial fibrillation, unspecified: Secondary | ICD-10-CM | POA: Diagnosis present

## 2013-01-07 DIAGNOSIS — Z91199 Patient's noncompliance with other medical treatment and regimen due to unspecified reason: Secondary | ICD-10-CM

## 2013-01-07 DIAGNOSIS — Z95810 Presence of automatic (implantable) cardiac defibrillator: Secondary | ICD-10-CM

## 2013-01-07 LAB — CBC WITH DIFFERENTIAL/PLATELET
Hemoglobin: 10 g/dL — ABNORMAL LOW (ref 13.0–17.0)
Lymphocytes Relative: 20 % (ref 12–46)
Lymphs Abs: 1.6 10*3/uL (ref 0.7–4.0)
MCH: 23.1 pg — ABNORMAL LOW (ref 26.0–34.0)
Monocytes Relative: 9 % (ref 3–12)
Neutrophils Relative %: 68 % (ref 43–77)
Platelets: 236 10*3/uL (ref 150–400)
RBC: 4.32 MIL/uL (ref 4.22–5.81)
WBC: 7.8 10*3/uL (ref 4.0–10.5)

## 2013-01-07 LAB — PROTIME-INR
INR: 1.21 (ref 0.00–1.49)
Prothrombin Time: 15 seconds (ref 11.6–15.2)

## 2013-01-07 LAB — BASIC METABOLIC PANEL
BUN: 15 mg/dL (ref 6–23)
CO2: 28 mEq/L (ref 19–32)
Chloride: 99 mEq/L (ref 96–112)
Glucose, Bld: 143 mg/dL — ABNORMAL HIGH (ref 70–99)
Potassium: 4.2 mEq/L (ref 3.5–5.1)
Sodium: 135 mEq/L (ref 135–145)

## 2013-01-07 LAB — POCT I-STAT TROPONIN I

## 2013-01-07 LAB — PRO B NATRIURETIC PEPTIDE: Pro B Natriuretic peptide (BNP): 2389 pg/mL — ABNORMAL HIGH (ref 0–125)

## 2013-01-07 LAB — GLUCOSE, CAPILLARY: Glucose-Capillary: 214 mg/dL — ABNORMAL HIGH (ref 70–99)

## 2013-01-07 MED ORDER — DIGOXIN 125 MCG PO TABS
0.1250 mg | ORAL_TABLET | Freq: Every day | ORAL | Status: DC
  Administered 2013-01-08 – 2013-01-12 (×5): 0.125 mg via ORAL
  Filled 2013-01-07 (×5): qty 1

## 2013-01-07 MED ORDER — HEPARIN BOLUS VIA INFUSION
4000.0000 [IU] | Freq: Once | INTRAVENOUS | Status: AC
  Administered 2013-01-07: 4000 [IU] via INTRAVENOUS
  Filled 2013-01-07: qty 4000

## 2013-01-07 MED ORDER — CARVEDILOL 6.25 MG PO TABS
18.7500 mg | ORAL_TABLET | Freq: Once | ORAL | Status: AC
  Administered 2013-01-07: 18.75 mg via ORAL
  Filled 2013-01-07: qty 1

## 2013-01-07 MED ORDER — CARVEDILOL 6.25 MG PO TABS
18.7500 mg | ORAL_TABLET | Freq: Two times a day (BID) | ORAL | Status: DC
  Administered 2013-01-08: 18.75 mg via ORAL
  Filled 2013-01-07 (×3): qty 1

## 2013-01-07 MED ORDER — NITROGLYCERIN 0.4 MG SL SUBL
0.4000 mg | SUBLINGUAL_TABLET | SUBLINGUAL | Status: DC | PRN
  Administered 2013-01-07 – 2013-01-09 (×3): 0.4 mg via SUBLINGUAL
  Filled 2013-01-07: qty 25

## 2013-01-07 MED ORDER — SODIUM CHLORIDE 0.9 % IJ SOLN: 3.0000 mL | INTRAMUSCULAR | Status: DC | PRN

## 2013-01-07 MED ORDER — ATORVASTATIN CALCIUM 80 MG PO TABS
80.0000 mg | ORAL_TABLET | Freq: Every day | ORAL | Status: DC
  Administered 2013-01-08 – 2013-01-12 (×5): 80 mg via ORAL
  Filled 2013-01-07 (×5): qty 1

## 2013-01-07 MED ORDER — ACETAMINOPHEN 325 MG PO TABS: 650.0000 mg | ORAL_TABLET | ORAL | Status: DC | PRN

## 2013-01-07 MED ORDER — NITROGLYCERIN 0.4 MG SL SUBL
0.4000 mg | SUBLINGUAL_TABLET | SUBLINGUAL | Status: AC | PRN
  Administered 2013-01-07 (×3): 0.4 mg via SUBLINGUAL

## 2013-01-07 MED ORDER — LISINOPRIL 2.5 MG PO TABS
2.5000 mg | ORAL_TABLET | Freq: Every day | ORAL | Status: DC
  Administered 2013-01-07: 23:00:00 2.5 mg via ORAL
  Filled 2013-01-07 (×2): qty 1

## 2013-01-07 MED ORDER — NITROGLYCERIN 2 % TD OINT
1.0000 [in_us] | TOPICAL_OINTMENT | Freq: Four times a day (QID) | TRANSDERMAL | Status: DC
  Administered 2013-01-07 – 2013-01-08 (×2): 1 [in_us] via TOPICAL
  Filled 2013-01-07: qty 30

## 2013-01-07 MED ORDER — SODIUM CHLORIDE 0.9 % IJ SOLN
3.0000 mL | Freq: Two times a day (BID) | INTRAMUSCULAR | Status: DC
  Administered 2013-01-08 – 2013-01-11 (×8): 3 mL via INTRAVENOUS

## 2013-01-07 MED ORDER — ASPIRIN EC 81 MG PO TBEC
81.0000 mg | DELAYED_RELEASE_TABLET | Freq: Every day | ORAL | Status: DC
  Administered 2013-01-08 – 2013-01-12 (×5): 81 mg via ORAL
  Filled 2013-01-07 (×5): qty 1

## 2013-01-07 MED ORDER — FUROSEMIDE 10 MG/ML IJ SOLN
80.0000 mg | Freq: Two times a day (BID) | INTRAMUSCULAR | Status: DC
  Administered 2013-01-07: 23:00:00 80 mg via INTRAVENOUS
  Filled 2013-01-07 (×3): qty 8

## 2013-01-07 MED ORDER — POTASSIUM CHLORIDE CRYS ER 10 MEQ PO TBCR
10.0000 meq | EXTENDED_RELEASE_TABLET | Freq: Two times a day (BID) | ORAL | Status: DC
  Administered 2013-01-07 – 2013-01-12 (×10): 10 meq via ORAL
  Filled 2013-01-07 (×12): qty 1

## 2013-01-07 MED ORDER — ONDANSETRON HCL 4 MG/2ML IJ SOLN: 4.0000 mg | Freq: Four times a day (QID) | INTRAMUSCULAR | Status: DC | PRN

## 2013-01-07 MED ORDER — INSULIN ASPART 100 UNIT/ML ~~LOC~~ SOLN
0.0000 [IU] | Freq: Three times a day (TID) | SUBCUTANEOUS | Status: DC
  Administered 2013-01-08: 07:00:00 2 [IU] via SUBCUTANEOUS
  Administered 2013-01-08: 3 [IU] via SUBCUTANEOUS
  Administered 2013-01-09: 13:00:00 via SUBCUTANEOUS
  Administered 2013-01-10 (×2): 3 [IU] via SUBCUTANEOUS

## 2013-01-07 MED ORDER — GLIPIZIDE 5 MG PO TABS
5.0000 mg | ORAL_TABLET | Freq: Every day | ORAL | Status: DC
  Administered 2013-01-08 – 2013-01-12 (×5): 5 mg via ORAL
  Filled 2013-01-07 (×5): qty 1

## 2013-01-07 MED ORDER — MORPHINE SULFATE 4 MG/ML IJ SOLN
4.0000 mg | Freq: Once | INTRAMUSCULAR | Status: AC
  Administered 2013-01-07: 4 mg via INTRAVENOUS
  Filled 2013-01-07: qty 1

## 2013-01-07 MED ORDER — HEPARIN (PORCINE) IN NACL 100-0.45 UNIT/ML-% IJ SOLN
1400.0000 [IU]/h | INTRAMUSCULAR | Status: DC
  Administered 2013-01-07: 1100 [IU]/h via INTRAVENOUS
  Filled 2013-01-07 (×2): qty 250

## 2013-01-07 MED ORDER — SODIUM CHLORIDE 0.9 % IV SOLN
250.0000 mL | INTRAVENOUS | Status: DC | PRN
  Administered 2013-01-11: 250 mL via INTRAVENOUS

## 2013-01-07 MED ORDER — NITROGLYCERIN 2 % TD OINT
1.0000 [in_us] | TOPICAL_OINTMENT | Freq: Once | TRANSDERMAL | Status: AC
  Administered 2013-01-07: 1 [in_us] via TOPICAL
  Filled 2013-01-07: qty 1

## 2013-01-07 NOTE — Progress Notes (Signed)
ANTICOAGULATION CONSULT NOTE - Initial Consult  Pharmacy Consult for heparin Indication: chest pain/ACS  No Known Allergies  Patient Measurements: weight 98 kg, height 67 inches (per patient)   Heparin Dosing Weight: 87 kg  Vital Signs: Temp: 98.3 F (36.8 C) (10/16 1419) Temp src: Oral (10/16 1419) BP: 146/86 mmHg (10/16 1745) Pulse Rate: 77 (10/16 1745)  Labs:  Recent Labs  01/07/13 1459  HGB 10.0*  HCT 32.9*  PLT 236  CREATININE 0.82    The CrCl is unknown because both a height and weight (above a minimum accepted value) are required for this calculation.   Medical History: Past Medical History  Diagnosis Date  . Diabetes mellitus   . Hypertension   . Systolic heart failure     EF is 25 to 30%  . Noncompliance   . CAD (coronary artery disease) Sept 2013    s/p cardiac cath showing occlusion of small RCA with collaterals  . Chronic anticoagulation     on coumadin  . Atrial fibrillation     Medications:  See med rec  Assessment: Patient is a 57 y.o homeless F with known hx CAD on coumadin PTA for afib.  Patient informed me that he has not taken his coumadin in 1 week. INR is subtherapeutic today at 1.21. He presented to the ED today with c/o CP.  To start heparin for r/o ACS and Afib.   Goal of Therapy:  Heparin level 0.3-0.7 units/ml Monitor platelets by anticoagulation protocol: Yes   Plan:  1) heparin 4000 units IV x1 bolus, then drip at 1100 units/hr 2) check 6 hour heparin level  Myreon Wimer P 01/07/2013,7:37 PM

## 2013-01-07 NOTE — ED Notes (Addendum)
Presents to ED via EMS with c/o chest pain onset today. Per EMS, sinus tach. At 120. Pt rates chest pain 3/10 at nitro x2 and ASA 324mg . Pt states he had cough last night that seem to make it worse. Per EMS, pace maker placed, double bypass x5 months. BP-135/75, HR-120 with intermittent PVCs, SpO2-98% room air. 20G right hand line place.

## 2013-01-07 NOTE — ED Provider Notes (Signed)
CSN: [id number]     Arrival date & time 01/07/13  1410 History   First MD Initiated Contact with Patient 01/07/13 1451     Chief Complaint  Patient presents with  . Chest Pain   (Consider location/radiation/quality/duration/timing/severity/associated sxs/prior Treatment) Patient is a 57 y.o. male presenting with chest pain. The history is provided by the patient.  Chest Pain Pain location:  L chest Pain quality: pressure   Pain radiates to:  Does not radiate Pain radiates to the back: no   Pain severity:  Moderate Onset quality:  Gradual Duration:  7 hours Timing:  Constant Progression:  Improving Chronicity:  Recurrent Context: at rest   Worsened by:  Deep breathing and movement Ineffective treatments:  Aspirin and nitroglycerin Associated symptoms: cough (two days)   Associated symptoms: no abdominal pain, no anorexia, no back pain, no dizziness, no fatigue, no fever, no headache, no nausea, no orthopnea, no shortness of breath, no syncope, not vomiting and no weakness   Risk factors: coronary artery disease, high cholesterol and hypertension   Risk factors: no prior DVT/PE     Past Medical History  Diagnosis Date  . Diabetes mellitus   . Hypertension   . Systolic heart failure     EF is 25 to 30%  . Noncompliance   . CAD (coronary artery disease) Sept 2013    s/p cardiac cath showing occlusion of small RCA with collaterals  . Chronic anticoagulation     on coumadin  . Atrial fibrillation    Past Surgical History  Procedure Laterality Date  . S/p cardiac cath  Sept 2013  . Coronary artery bypass graft      2 vessels per patient [name]  July 2014  . Implantable cardioverter defibrillator implant      [place] in 07/2012   Family History  Problem Relation Age of Onset  . Diabetes Mother    History  Substance Use Topics  . Smoking status: Current Every Day Smoker -- 0.50 packs/day for 20 years    Types: Cigarettes  . Smokeless tobacco: Never Used      Comment: Still with 3 - 4 per day.   . Alcohol Use: No    Review of Systems  Constitutional: Negative for fever and fatigue.  Respiratory: Positive for cough (two days). Negative for chest tightness and shortness of breath.   Cardiovascular: Positive for chest pain. Negative for orthopnea and syncope.  Gastrointestinal: Negative for nausea, vomiting, abdominal pain, diarrhea, constipation and anorexia.  Genitourinary: Negative for dysuria and difficulty urinating.  Musculoskeletal: Negative for arthralgias, back pain and myalgias.  Skin: Negative for color change, pallor, rash and wound.  Neurological: Negative for dizziness, weakness and headaches.  All other systems reviewed and are negative.    Allergies  Review of patient's allergies indicates no known allergies.  Home Medications   No current outpatient prescriptions on file. BP 140/93  Pulse 130  Temp(Src) 98.3 F (36.8 C) (Oral)  Resp 18  Ht 5\' 7"  (1.702 m)  Wt 221 lb 6.4 oz (100.426 kg)  BMI 34.67 kg/m2  SpO2 97% Physical Exam  Nursing note and vitals reviewed. Constitutional: He is oriented to person, place, and time. He appears well-developed and well-nourished. No distress.  Pleasant, well appearing male in NAD  HENT:  Head: Normocephalic and atraumatic.  Mouth/Throat: Oropharynx is clear and moist. No oropharyngeal exudate.  Eyes: Conjunctivae and EOM are normal. Pupils are equal, round, and reactive to light.  Neck: Normal range of motion. Neck  supple.  Cardiovascular: Normal rate, regular rhythm, normal heart sounds and intact distal pulses.  Exam reveals no gallop and no friction rub.   No murmur heard. Pulmonary/Chest: Effort normal and breath sounds normal. No respiratory distress. He has no wheezes. He has no rales. He exhibits tenderness (over the left chest).  Abdominal: Soft. He exhibits no distension and no mass. There is no tenderness. There is no rebound and no guarding.  Musculoskeletal: Normal  range of motion. He exhibits no edema and no tenderness.  Lymphadenopathy:    He has no cervical adenopathy.  Neurological: He is alert and oriented to person, place, and time. No cranial nerve deficit.  Skin: Skin is warm and dry. No rash noted. He is not diaphoretic.  Psychiatric: He has a normal mood and affect. His behavior is normal. Judgment and thought content normal.    ED Course  Procedures (including critical care time) Labs Review Labs Reviewed  CBC WITH DIFFERENTIAL - Abnormal; Notable for the following:    Hemoglobin 10.0 (*)    HCT 32.9 (*)    MCV 76.2 (*)    MCH 23.1 (*)    RDW 20.9 (*)    All other components within normal limits  BASIC METABOLIC PANEL - Abnormal; Notable for the following:    Glucose, Bld 143 (*)    All other components within normal limits  PRO B NATRIURETIC PEPTIDE - Abnormal; Notable for the following:    Pro B Natriuretic peptide (BNP) 2389.0 (*)    All other components within normal limits  DIGOXIN LEVEL - Abnormal; Notable for the following:    Digoxin Level <0.3 (*)    All other components within normal limits  GLUCOSE, CAPILLARY - Abnormal; Notable for the following:    Glucose-Capillary 214 (*)    All other components within normal limits  PROTIME-INR  CBC  COMPREHENSIVE METABOLIC PANEL  TSH  HEPARIN LEVEL (UNFRACTIONATED)  POCT I-STAT TROPONIN I  POCT I-STAT TROPONIN I   Imaging Review Dg Chest 2 View  01/07/2013   CLINICAL DATA:  Chest pain. Cough.  EXAM: CHEST  2 VIEW  COMPARISON:  05/17/2012  FINDINGS: The patient has developed bilateral pulmonary edema and pulmonary vascular congestion as well as tiny bilateral pleural effusions. There is borderline cardiomegaly. AICD in place. Prior CABG.  IMPRESSION: Congestive heart failure.   Electronically Signed   By: Geanie Cooley M.D.   On: 01/07/2013 15:55    EKG Interpretation     Ventricular Rate:    PR Interval:    QRS Duration:   QT Interval:    QTC Calculation:   R  Axis:     Text Interpretation:              Date: 01/07/2013  Rate: 97  Rhythm: atrial fibrillation  QRS Axis: normal  Intervals: normal  ST/T Wave abnormalities: normal  Conduction Disutrbances:none  Narrative Interpretation:   Old EKG Reviewed: unchanged   MDM   1. CHF (congestive heart failure), acute on chronic, systolic   2. Acute on chronic systolic heart failure   3. Chest pain   4. Coronary artery disease     57 year old male with a history of hypertension, A. fib, CHF, type 2 diabetes, coronary artery disease status post CABG approximately 5 months ago who presents with chest pain. Pain started at 8:00 this morning and has not been resolved with aspirin as well as nitroglycerin. The patient was described as a heaviness over his left chest as well  as pain with inspiration and movement. Patient states that this feels similar to his previous heart pain. Admits that he has had similar episodes in the past, approximately monthly since his bypass, that resolved with time as well as nitroglycerin.  EKG shows atrial fibrillation but no ischemic changes. Unchanged from previous. He will need troponin, laboratory evaluation, chest x-ray. Pain is reproducible on palpation as well as movement, however patient is a high risk for ACS. Patient is PERC negative and doubt PE. Nitroglycerin sublingual for pain as well as morphine. Should we get the patient chest pain-free, we'll apply nitro paste.  BNP returned elevated at 2400. Troponin negative. Her laboratory workup unremarkable. Her EKG without ischemic changes. Chest x-ray with bilateral pulmonary edema and vascular congestion. Her chest pains were consistent with musculoskeletal pain rather than ACS, however based on risk factors, cardiology was consulted for evaluation and decided to admit the patient. He will also be admitted for acute on chronic systolic heart failure. Lasix was given in the ED and the patient was admitted to the  cardiology floor in hemodynamically stable condition.  This patient was discussed with my attending, Dr. Wilkie Aye.  Dorna Leitz, MD 01/08/13 [id number]

## 2013-01-07 NOTE — ED Notes (Signed)
Attempted to call report. 4 east unsure if that floor is appropriate for patient with active chest pain. 4 Best boy with rapid response and house supervisor.

## 2013-01-07 NOTE — H&P (Signed)
CARDIOLOGY ADMISSION NOTE  Patient ID: Jacob [name] MRN: [id number] DOB/AGE: January 29, 1956 57 y.o.  Admit date: 01/07/2013 Primary Physician   None Primary Cardiologist   Dr. Elease [name] (previously) Chief Complaint    Chest pain.  HPI:  The patient has a history of CAD and CABG.  His last admission was 9/13 with atrial fib.   The cath at that time demonstrated 20 -30% LAD stenosis in the mid vessel with distal 40% stenosis.  The diagonal has 60-70% stenosis.  RCA had 30 - 40% stenosis at the ostium.  Since we last saw him he has traveled and apparently had an ICD placed in [place] and more recently CABG at [place] in July.  He was last hospitalized a couple of weeks ago in [place] with apparent CHF.    He is now back in [place] but is homeless and living on the streets.  He has had his meds.  He has been eating fast food and foot at a shelter.  He presents with chest pain that woke him today.  It is sharp and constant since this AM.  It is worse with inspiration and movement and cough.  There is no jaw or arm pain.  It is near his ICD site and left upper chest.  There are no other associated symptoms.  He thinks that his breathing is OK and he does not describe PND or orthopnea.  He has had some chronic lower extremity edema.  He presented to the ER with pain.  He did think that there was some improvement with SLNTG.  He now has NTG paste and he has had morphine.  In the ER initial POC troponin was negative.  ProBNP is slightly elevated.   CXR has edema.  He currently reports some 2/10 pain.     Past Medical History  Diagnosis Date  . Diabetes mellitus   . Hypertension   . Systolic heart failure     EF is 25 to 30%  . Noncompliance   . CAD (coronary artery disease) Sept 2013    s/p cardiac cath showing occlusion of small RCA with collaterals  . Chronic anticoagulation     on coumadin  . Atrial fibrillation     Past Surgical History  Procedure Laterality Date  . S/p cardiac cath  Sept  2013  . Coronary artery bypass graft      2 vessels per patient Jacob [name])  July 2014  . Implantable cardioverter defibrillator implant      [place] in 07/2012    No Known Allergies No current facility-administered medications on file prior to encounter.   Current Outpatient Prescriptions on File Prior to Encounter  Medication Sig Dispense Refill  . aspirin EC 81 MG tablet Take 81 mg by mouth daily after lunch.       Marland Kitchen atorvastatin (LIPITOR) 80 MG tablet Take 80 mg by mouth daily after lunch.      . carvedilol (COREG) 25 MG tablet Take 37.5 mg by mouth daily after lunch.      . digoxin (LANOXIN) 0.125 MG tablet Take 0.125 mg by mouth daily after lunch.  (reports he does not have this)      . furosemide (LASIX) 40 MG tablet Take 40 mg by mouth daily after lunch.       Marland Kitchen glipiZIDE (GLUCOTROL) 5 MG tablet Take 5 mg by mouth daily after lunch.       . nitroGLYCERIN (NITROSTAT) 0.4 MG SL tablet Place 1 tablet (0.4 mg total) under  the tongue every 5 (five) minutes as needed for chest pain.  10 tablet  12  . warfarin (COUMADIN) 5 MG tablet Take 5 mg by mouth daily after lunch. (Has not been taking x 2 wks)       History   Social History  . Marital Status: Married    Spouse Name: N/A    Number of Children: N/A  . Years of Education: N/A   Occupational History  . unemployed    Social History Main Topics  . Smoking status: Current Every Day Smoker -- 0.50 packs/day  . Smokeless tobacco: Never Used     Comment: Stopped 10-08-11  . Alcohol Use: No  . Drug Use: No  . Sexual Activity: No   Other Topics Concern  . Not on file   Social History Narrative   Homeless.  He has been living on the streets for 20 days.      Family History  Problem Relation Age of Onset  . Diabetes Mother      ROS:  As stated in the HPI and negative for all other systems.  Physical Exam: Blood pressure 153/90, pulse 94, temperature 98.3 F (36.8 C), temperature source Oral, resp. rate 17, SpO2 97.00%.    GENERAL:  Well appearing HEENT:  Pupils equal round and reactive, fundi not visualized, oral mucosa unremarkable, poor dentition NECK:  Jugular venous distention to the jaw at 45 degrees, waveform within normal limits, carotid upstroke brisk and symmetric, no bruits, no thyromegaly LYMPHATICS:  No cervical, inguinal adenopathy LUNGS: Bilateral basilar crackles BACK:  No CVA tenderness CHEST:  Healed ICD pocket and sternal wound HEART:  PMI not displaced or sustained,S1 and S2 within normal limits, no S3, no S4, no clicks, no rubs, distant heart sounds ABD:  Flat, positive bowel sounds normal in frequency in pitch, no bruits, no rebound, no guarding, no midline pulsatile mass, no hepatomegaly, no splenomegaly EXT:  2 plus pulses throughout, mild/moderate ankle edema, no cyanosis no clubbing SKIN:  No rashes no nodules NEURO:  Cranial nerves II through XII grossly intact, motor grossly intact throughout PSYCH:  Cognitively intact, oriented to person place and time  Labs: Lab Results  Component Value Date   BUN 15 01/07/2013   Lab Results  Component Value Date   CREATININE 0.82 01/07/2013   Lab Results  Component Value Date   NA 135 01/07/2013   K 4.2 01/07/2013   CL 99 01/07/2013   CO2 28 01/07/2013    Lab Results  Component Value Date   WBC 7.8 01/07/2013   HGB 10.0* 01/07/2013   HCT 32.9* 01/07/2013   MCV 76.2* 01/07/2013   PLT 236 01/07/2013     Radiology:  CXR:  Congestive heart failure.    EKG:  Atrial fib, rate 97, nonspecific diffuse ST T wave changes.  01/07/2013  ASSESSMENT AND PLAN:    CHEST PAIN:  This is very atypical and appears to be musculoskeletal.  However, he reports that it is similar to previous angina.  I will cycle enzymes.  However, I doubt that any invasive work up will be indicated.  We will [facility] to get [facility]  ACUTE ON CHRONIC SYSTOLIC AND DIASTOLIC HF:  He is clearly volume overloaded.  I will admit for IV diuresis.  I see no  contraindication to ACE inhibitors.  I will also continue the current beta blocker.  ATRIAL FIB:  He is not a candidate for warfarin and I think high risk for any systemic  anticoagulation with his poor social situation.  If we can get him help with this and his medications I would prescribe a NOAC. For tonight we will start heparin.  HTN:  This is being managed in the context of treating his CHF  DM:  Continue current therapy.  DISPOSITION:  He will need a Child psychotherapist consult.   SignedRollene Rotunda 01/07/2013, 5:49 PM

## 2013-01-08 DIAGNOSIS — E119 Type 2 diabetes mellitus without complications: Secondary | ICD-10-CM

## 2013-01-08 DIAGNOSIS — Z9119 Patient's noncompliance with other medical treatment and regimen: Secondary | ICD-10-CM

## 2013-01-08 DIAGNOSIS — I509 Heart failure, unspecified: Secondary | ICD-10-CM

## 2013-01-08 DIAGNOSIS — I4891 Unspecified atrial fibrillation: Secondary | ICD-10-CM

## 2013-01-08 DIAGNOSIS — R0789 Other chest pain: Secondary | ICD-10-CM

## 2013-01-08 DIAGNOSIS — R079 Chest pain, unspecified: Secondary | ICD-10-CM

## 2013-01-08 LAB — COMPREHENSIVE METABOLIC PANEL
AST: 8 U/L (ref 0–37)
Albumin: 2.9 g/dL — ABNORMAL LOW (ref 3.5–5.2)
Alkaline Phosphatase: 128 U/L — ABNORMAL HIGH (ref 39–117)
CO2: 27 mEq/L (ref 19–32)
Calcium: 8.3 mg/dL — ABNORMAL LOW (ref 8.4–10.5)
Creatinine, Ser: 0.91 mg/dL (ref 0.50–1.35)
GFR calc Af Amer: >90 mL/min (ref >90)
GFR calc non Af Amer: >90 mL/min (ref >90)
Potassium: 3.8 mEq/L (ref 3.5–5.1)
Total Protein: 6.5 g/dL (ref 6.0–8.3)

## 2013-01-08 LAB — FOLATE: Folate: 18.8 ng/mL

## 2013-01-08 LAB — RETICULOCYTES
RBC.: 3.93 MIL/uL — ABNORMAL LOW (ref 4.22–5.81)
Retic Count, Absolute: 90.4 K/uL (ref 19.0–186.0)
Retic Ct Pct: 2.3 % (ref 0.4–3.1)

## 2013-01-08 LAB — CBC
Hemoglobin: 9.1 g/dL — ABNORMAL LOW (ref 13.0–17.0)
MCH: 23 pg — ABNORMAL LOW (ref 26.0–34.0)
RBC: 3.95 MIL/uL — ABNORMAL LOW (ref 4.22–5.81)
WBC: 7.2 10*3/uL (ref 4.0–10.5)

## 2013-01-08 LAB — HEPARIN LEVEL (UNFRACTIONATED)
Heparin Unfractionated: <0.1 [IU]/mL — ABNORMAL LOW (ref 0.30–0.70)
Heparin Unfractionated: <0.1 [IU]/mL — ABNORMAL LOW (ref 0.30–0.70)

## 2013-01-08 LAB — VITAMIN B12: Vitamin B-12: 664 pg/mL (ref 211–911)

## 2013-01-08 LAB — HEMOGLOBIN A1C
Hgb A1c MFr Bld: 6.8 % — ABNORMAL HIGH
Mean Plasma Glucose: 148 mg/dL — ABNORMAL HIGH

## 2013-01-08 LAB — GLUCOSE, CAPILLARY
Glucose-Capillary: 145 mg/dL — ABNORMAL HIGH (ref 70–99)
Glucose-Capillary: 166 mg/dL — ABNORMAL HIGH (ref 70–99)
Glucose-Capillary: 105 mg/dL — ABNORMAL HIGH (ref 70–99)

## 2013-01-08 LAB — TROPONIN I: Troponin I: <0.3 ng/mL

## 2013-01-08 LAB — IRON AND TIBC
Iron: 16 ug/dL — ABNORMAL LOW (ref 42–135)
TIBC: 265 ug/dL (ref 215–435)
UIBC: 249 ug/dL (ref 125–400)

## 2013-01-08 LAB — TSH: TSH: 1.086 u[IU]/mL (ref 0.350–4.500)

## 2013-01-08 LAB — FERRITIN: Ferritin: 34 ng/mL (ref 22–322)

## 2013-01-08 MED ORDER — LISINOPRIL 5 MG PO TABS
5.0000 mg | ORAL_TABLET | Freq: Every day | ORAL | Status: DC
  Administered 2013-01-08 – 2013-01-12 (×5): 5 mg via ORAL
  Filled 2013-01-08 (×6): qty 1

## 2013-01-08 MED ORDER — CARVEDILOL 25 MG PO TABS
25.0000 mg | ORAL_TABLET | Freq: Two times a day (BID) | ORAL | Status: DC
  Administered 2013-01-08 – 2013-01-12 (×8): 25 mg via ORAL
  Filled 2013-01-08 (×10): qty 1

## 2013-01-08 MED ORDER — HEPARIN BOLUS VIA INFUSION
2000.0000 [IU] | Freq: Once | INTRAVENOUS | Status: AC
  Administered 2013-01-08: 12:00:00 2000 [IU] via INTRAVENOUS
  Filled 2013-01-08: qty 2000

## 2013-01-08 MED ORDER — HEPARIN (PORCINE) IN NACL 100-0.45 UNIT/ML-% IJ SOLN
1800.0000 [IU]/h | INTRAMUSCULAR | Status: DC
  Administered 2013-01-08: 1800 [IU]/h via INTRAVENOUS
  Filled 2013-01-08 (×3): qty 250

## 2013-01-08 MED ORDER — FUROSEMIDE 40 MG PO TABS
40.0000 mg | ORAL_TABLET | Freq: Two times a day (BID) | ORAL | Status: DC
  Administered 2013-01-08 – 2013-01-12 (×9): 40 mg via ORAL
  Filled 2013-01-08 (×13): qty 1

## 2013-01-08 MED ORDER — HEPARIN (PORCINE) IN NACL 100-0.45 UNIT/ML-% IJ SOLN
2250.0000 [IU]/h | INTRAMUSCULAR | Status: DC
  Administered 2013-01-08 – 2013-01-09 (×2): 2000 [IU]/h via INTRAVENOUS
  Administered 2013-01-09: 03:00:00 2150 [IU]/h via INTRAVENOUS
  Administered 2013-01-09 – 2013-01-11 (×5): 2250 [IU]/h via INTRAVENOUS
  Filled 2013-01-08 (×11): qty 250

## 2013-01-08 MED ORDER — HEPARIN BOLUS VIA INFUSION
2000.0000 [IU] | Freq: Once | INTRAVENOUS | Status: AC
  Administered 2013-01-08: 04:00:00 2000 [IU] via INTRAVENOUS
  Filled 2013-01-08: qty 2000

## 2013-01-08 NOTE — Care Management Note (Addendum)
  Page 1 of 1   01/08/2013     2:04:34 PM   CARE MANAGEMENT NOTE 01/08/2013  Patient:  SOSAIA, PITTINGER   Account Number:  [id number]  Date Initiated:  01/08/2013  Documentation initiated by:  Oletta Cohn  Subjective/Objective Assessment:   57 yo male admitted with CP, CHF exacerbation/ Homeless     Action/Plan:   Cycle enzymes, IV diureses/ consult SW for dispostition   Anticipated DC Date:  01/11/2013   Anticipated DC Plan:  GROUP HOME  In-house referral  Clinical Social Worker      DC Planning Services  CM consult      [facility] Choice  NA   Choice offered to / List presented to:             Status of service:   Medicare Important Message given?   (If response is "NO", the following Medicare IM given date fields will be blank) Date Medicare IM given:   Date Additional Medicare IM given:    Discharge Disposition:    Per UR Regulation:    If discussed at Long Length of Stay Meetings, dates discussed:    Comments:  01/08/13 1335 Oletta Cohn, RN, BSN, [employer] [phone] Spoke with pt at bedside regarding discharge planning.  Pt states he is homeless and would like to speak with SW to help determine disposition prior to agreeing to home health services.  NCM will continue to follow.                                 Contact Information   Name                         Relation               Home   Zhen,Sawsan             Spouse               [phone]

## 2013-01-08 NOTE — Progress Notes (Signed)
TELEMETRY: Reviewed telemetry pt in Atrial fibrillation, rate 80-100 bpm.: Filed Vitals:   01/07/13 2131 01/07/13 2258 01/08/13 0159 01/08/13 0609  BP: 154/83 140/93 108/63 133/84  Pulse: 130  90 85  Temp: 98.3 F (36.8 C)  98.2 F (36.8 C) 97.9 F (36.6 C)  TempSrc: Oral  Oral Oral  Resp: 18  18 18   Height: 5\' 7"  (1.702 m)     Weight: 221 lb 6.4 oz (100.426 kg)   217 lb 3.2 oz (98.521 kg)  SpO2: 97%  97% 98%    Intake/Output Summary (Last 24 hours) at 01/08/13 0824 Last data filed at 01/08/13 0610  Gross per 24 hour  Intake    240 ml  Output   1950 ml  Net  -1710 ml    SUBJECTIVE Feels better this am. Complains of pain localized over ICD. Pain is improved. No SOB. Excellent diuresis.  LABS: Basic Metabolic Panel:  Recent Labs  45/40/98 1459 01/08/13 0234  NA 135 136  K 4.2 3.8  CL 99 100  CO2 28 27  GLUCOSE 143* 254*  BUN 15 15  CREATININE 0.82 0.91  CALCIUM 9.0 8.3*   Liver Function Tests:  Recent Labs  01/08/13 0234  AST 8  ALT 10  ALKPHOS 128*  BILITOT 0.3  PROT 6.5  ALBUMIN 2.9*   No results found for this basename: LIPASE, AMYLASE,  in the last 72 hours CBC:  Recent Labs  01/07/13 1459 01/08/13 0234  WBC 7.8 7.2  NEUTROABS 5.3  --   HGB 10.0* 9.1*  HCT 32.9* 30.0*  MCV 76.2* 75.9*  PLT 236 234   Cardiac Enzymes: No results found for this basename: CKTOTAL, CKMB, CKMBINDEX, TROPONINI,  in the last 72 hours BNP: 2389  Hemoglobin A1C: No results found for this basename: HGBA1C,  in the last 72 hours Fasting Lipid Panel: No results found for this basename: CHOL, HDL, LDLCALC, TRIG, CHOLHDL, LDLDIRECT,  in the last 72 hours Thyroid Function Tests: No results found for this basename: TSH, T4TOTAL, FREET3, T3FREE, THYROIDAB,  in the last 72 hours Anemia Panel: No results found for this basename: VITAMINB12, FOLATE, FERRITIN, TIBC, IRON, RETICCTPCT,  in the last 72 hours  Radiology/Studies:  Dg Chest 2 View  01/07/2013   CLINICAL  DATA:  Chest pain. Cough.  EXAM: CHEST  2 VIEW  COMPARISON:  05/17/2012  FINDINGS: The patient has developed bilateral pulmonary edema and pulmonary vascular congestion as well as tiny bilateral pleural effusions. There is borderline cardiomegaly. AICD in place. Prior CABG.  IMPRESSION: Congestive heart failure.   Electronically Signed   By: Geanie Cooley M.D.   On: 01/07/2013 15:55   Ecg: atrial fibrillation, old septal infarct. Nonspecific ST-T abnormality.  PHYSICAL EXAM General: Well developed, well nourished, in no acute distress. Head: Normal Neck: Negative for carotid bruits. JVD  elevated. Lungs: Few basilar rales. Breathing is unlabored. Heart: IRRR S1 S2 without murmurs, rubs, or gallops. Pain to palpation localized to area over ICD Abdomen: Soft, non-tender, non-distended with normoactive bowel sounds. No hepatomegaly. No rebound/guarding. No obvious abdominal masses. Msk:  Strength and tone appears normal for age. Extremities: trace edema.  Distal pedal pulses are 2+ and equal bilaterally. Neuro: Alert and oriented X 3. Moves all extremities spontaneously. Psych:  Responds to questions appropriately with a normal affect.  ASSESSMENT AND PLAN: 1. Acute on chronic systolic/diastolic CHF. Exacerbated by dietary and compliance factors. Good diuresis overnight. Will increase coreg and lisinopril doses. Change lasix to po. Follow  BMET. Trying to get records from [place].  2. Atypical chest pain due to musculoskeletal pain. troponins not ordered. Will get today. OK to DC topical nitrates.  3. Atrial fibrillation chronic. Currently on IV heparin. Ideally needs a NOAC. Awaiting social work evaluation. If possible to get drug assistance would start NOAC and DC heparin. 4. HTN 5. DM on oral therapy. Check A1C.  6. Anemia ? Chronicity. Heme check stools and check anemia panel.    Principal Problem:   Acute on chronic systolic heart failure Active Problems:   HYPERTENSION   A-fib   DM  (diabetes mellitus)   Noncompliance   Musculoskeletal chest pain    Signed, Puneet Selden [place] MD,FACC 01/08/2013 8:24 AM

## 2013-01-08 NOTE — Progress Notes (Signed)
ANTICOAGULATION CONSULT NOTE - Follow Up  Pharmacy Consult for heparin Indication: chest pain/ACS  No Known Allergies  Patient Measurements: weight 98 kg, height 67 inches (per patient) Height: 5\' 7"  (170.2 cm) Weight: 217 lb 3.2 oz (98.521 kg) (scale B) IBW/kg (Calculated) : 66.1 Heparin Dosing Weight: 87 kg  Vital Signs: Temp: 97.3 F (36.3 C) (10/17 0928) Temp src: Oral (10/17 0928) BP: 110/74 mmHg (10/17 0928) Pulse Rate: 82 (10/17 0928)  Labs:  Recent Labs  01/07/13 1459 01/07/13 1943 01/08/13 0234 01/08/13 0908 01/08/13 1000  HGB 10.0*  --  9.1*  --   --   HCT 32.9*  --  30.0*  --   --   PLT 236  --  234  --   --   LABPROT  --  15.0  --   --   --   INR  --  1.21  --   --   --   HEPARINUNFRC  --   --  <0.10*  --  <0.10*  CREATININE 0.82  --  0.91  --   --   TROPONINI  --   --   --  <0.30  --     Estimated Creatinine Clearance: 102.6 ml/min (by C-G formula based on Cr of 0.91).   Medical History: Past Medical History  Diagnosis Date  . Diabetes mellitus   . Hypertension   . Systolic heart failure     EF is 25 to 30%  . Noncompliance   . CAD (coronary artery disease) Sept 2013    s/p cardiac cath showing occlusion of small RCA with collaterals  . Chronic anticoagulation     on coumadin  . Atrial fibrillation     Medications:  See med rec  Assessment: Patient is a 57 y.o homeless man with known hx CAD on coumadin PTA for afib.  Patient informed me that he has not taken his coumadin in 1 week. INR is subtherapeutic. He presented to the ED today with c/o CP. Patient continues to be subtherapeutic on heparin. No issues with IV noted, no bleeding noted. Plan to change to NOAC if possible.  Goal of Therapy:  Heparin level 0.3-0.7 units/ml Monitor platelets by anticoagulation protocol: Yes   Plan:  1) heparin 2000 units IV x1 bolus, then drip at 1800 units/hr 2) check 6 hour heparin level  Sheppard Coil PharmD., BCPS Clinical Pharmacist Pager  [phone] 01/08/2013 11:32 AM

## 2013-01-08 NOTE — Progress Notes (Signed)
Utilization Review Completed Zivah Mayr J. Jeanet Lupe, RN, BSN, NCM [phone]  

## 2013-01-08 NOTE — Progress Notes (Signed)
ANTICOAGULATION CONSULT NOTE - Follow Up  Pharmacy Consult for heparin Indication: chest pain/ACS  No Known Allergies  Patient Measurements: weight 98 kg, height 67 inches (per patient) Height: 5\' 7"  (170.2 cm) Weight: 221 lb 6.4 oz (100.426 kg) (scale B) IBW/kg (Calculated) : 66.1 Heparin Dosing Weight: 87 kg  Vital Signs: Temp: 98.2 F (36.8 C) (10/17 0159) Temp src: Oral (10/17 0159) BP: 108/63 mmHg (10/17 0159) Pulse Rate: 90 (10/17 0159)  Labs:  Recent Labs  01/07/13 1459 01/07/13 1943 01/08/13 0234  HGB 10.0*  --  9.1*  HCT 32.9*  --  30.0*  PLT 236  --  234  LABPROT  --  15.0  --   INR  --  1.21  --   HEPARINUNFRC  --   --  <0.10*  CREATININE 0.82  --  0.91    Estimated Creatinine Clearance: 103.5 ml/min (by C-G formula based on Cr of 0.91).   Medical History: Past Medical History  Diagnosis Date  . Diabetes mellitus   . Hypertension   . Systolic heart failure     EF is 25 to 30%  . Noncompliance   . CAD (coronary artery disease) Sept 2013    s/p cardiac cath showing occlusion of small RCA with collaterals  . Chronic anticoagulation     on coumadin  . Atrial fibrillation     Medications:  See med rec  Assessment: Patient is a 57 y.o homeless man with known hx CAD on coumadin PTA for afib.  Patient informed me that he has not taken his coumadin in 1 week. INR is subtherapeutic today at 1.21. He presented to the ED today with c/o CP.  To start heparin for r/o ACS and Afib. Initial heparin level is undetectable.   Goal of Therapy:  Heparin level 0.3-0.7 units/ml Monitor platelets by anticoagulation protocol: Yes   Plan:  1) heparin 2000 units IV x1 bolus, then drip at 1400 units/hr 2) check 6 hour heparin level  Jacob [name] 01/08/2013,3:55 AM

## 2013-01-08 NOTE — Progress Notes (Signed)
Nurse Tech noticed patient unhooked from IV heparin drip. Hooked patient back up to IV fluids and educated about the importance of heparin and to leave IV line alone. Mickey Farber, RN

## 2013-01-08 NOTE — Progress Notes (Signed)
The patient arrived to 4E28 at 2130 from the ED.  The patient was oriented to the unit and placed on telemetry.  He is currently in A. Fib on the monitor with a HR from 110-115.  VS were taken and the patient was assessed.  He is A&Ox4 and does not have any complaints of pain at this time.  He refused to remove his pants while being weighed.  The call bell was placed within reach.

## 2013-01-08 NOTE — Progress Notes (Signed)
ANTICOAGULATION CONSULT NOTE - Follow Up  Pharmacy Consult for heparin Indication: chest pain/ACS  No Known Allergies  Patient Measurements: weight 98 kg, height 67 inches (per patient) Height: 5\' 7"  (170.2 cm) Weight: 217 lb 3.2 oz (98.521 kg) (scale B) IBW/kg (Calculated) : 66.1 Heparin Dosing Weight: 87 kg  Vital Signs: Temp: 98.6 F (37 C) (10/17 1700) Temp src: Oral (10/17 1700) BP: 110/64 mmHg (10/17 1700) Pulse Rate: 92 (10/17 1700)  Labs:  Recent Labs  01/07/13 1459 01/07/13 1943 01/08/13 0234 01/08/13 0908 01/08/13 1000 01/08/13 1522 01/08/13 1800  HGB 10.0*  --  9.1*  --   --   --   --   HCT 32.9*  --  30.0*  --   --   --   --   PLT 236  --  234  --   --   --   --   LABPROT  --  15.0  --   --   --   --   --   INR  --  1.21  --   --   --   --   --   HEPARINUNFRC  --   --  <0.10*  --  <0.10*  --  0.13*  CREATININE 0.82  --  0.91  --   --   --   --   TROPONINI  --   --   --  <0.30  --  <0.30  --     Estimated Creatinine Clearance: 102.6 ml/min (by C-G formula based on Cr of 0.91).   Medical History: Past Medical History  Diagnosis Date  . Diabetes mellitus   . Hypertension   . Systolic heart failure     EF is 25 to 30%  . Noncompliance   . CAD (coronary artery disease) Sept 2013    s/p cardiac cath showing occlusion of small RCA with collaterals  . Chronic anticoagulation     on coumadin  . Atrial fibrillation     Medications:  See med rec  Assessment: Patient is a 57 y.o homeless man presented to the ED with CP. Patient has known hx CAD on coumadin PTA for afib. It has been 1 week since patient last took coumadin. INR was subtherapeutic on admission. Nurses note around 2pm states that patient was unhooked from IV heparin drip for ~30 minutes. Patient got about 4 hours of continuous IV heparin, and heparin level remains subtherapeutic at 0.13.   Goal of Therapy:  Heparin level 0.3-0.7 units/ml Monitor platelets by anticoagulation protocol:  Yes   Plan:  1) Increase heparin drip to 2000 units/hr, will hold off on bolus 2) check 6 hour heparin level  Gwenivere Hiraldo M. Allena Katz, PharmD Clinical Pharmacist- Resident Pager: [phone] Pharmacy: [phone] 01/08/2013 7:36 PM

## 2013-01-08 NOTE — ED Provider Notes (Signed)
I saw and evaluated the patient, reviewed the resident's note and I agree with the findings and plan.  I agree with the resident's EKG interpretation.  This a 64 her old male who presents with chest pain. Story is concerning for ACS.  He reports continued pain upon arrival to the ER. He has a history of diabetes, hypertension, coronary artery disease and heart failure. EKG shows atrial fibrillation and no significant change from prior. Troponin is negative. Given patient continued to have chest pain and risk factors, he will be admitted for serial troponins overnight.  Shon Baton, MD 01/08/13 2252

## 2013-01-09 DIAGNOSIS — I2510 Atherosclerotic heart disease of native coronary artery without angina pectoris: Secondary | ICD-10-CM

## 2013-01-09 LAB — BASIC METABOLIC PANEL
BUN: 16 mg/dL (ref 6–23)
Calcium: 8.2 mg/dL — ABNORMAL LOW (ref 8.4–10.5)
Chloride: 102 mEq/L (ref 96–112)
GFR calc Af Amer: >90 mL/min (ref >90)
GFR calc non Af Amer: >90 mL/min (ref >90)
Glucose, Bld: 152 mg/dL — ABNORMAL HIGH (ref 70–99)
Sodium: 140 mEq/L (ref 135–145)

## 2013-01-09 LAB — CBC
Hemoglobin: 9.6 g/dL — ABNORMAL LOW (ref 13.0–17.0)
MCH: 23 pg — ABNORMAL LOW (ref 26.0–34.0)
MCHC: 30.3 g/dL (ref 30.0–36.0)
Platelets: 223 10*3/uL (ref 150–400)
WBC: 7.9 10*3/uL (ref 4.0–10.5)

## 2013-01-09 LAB — GLUCOSE, CAPILLARY

## 2013-01-09 LAB — HEPARIN LEVEL (UNFRACTIONATED)
Heparin Unfractionated: 0.25 IU/mL — ABNORMAL LOW (ref 0.30–0.70)
Heparin Unfractionated: 0.3 IU/mL (ref 0.30–0.70)

## 2013-01-09 NOTE — Progress Notes (Signed)
ANTICOAGULATION CONSULT NOTE - Follow Up  Pharmacy Consult for heparin Indication: chest pain/ACS  No Known Allergies  Patient Measurements: weight 98 kg, height 67 inches (per patient) Height: 5\' 7"  (170.2 cm) Weight: 217 lb 3.2 oz (98.521 kg) (scale B) IBW/kg (Calculated) : 66.1 Heparin Dosing Weight: 87 kg  Vital Signs: Temp: 98.3 F (36.8 C) (10/17 2006) Temp src: Oral (10/17 2006) BP: 112/61 mmHg (10/17 2006) Pulse Rate: 104 (10/17 2006)  Labs:  Recent Labs  01/07/13 1459 01/07/13 1943  01/08/13 0234 01/08/13 0908 01/08/13 1000 01/08/13 1522 01/08/13 1800 01/09/13 0200  HGB 10.0*  --   --  9.1*  --   --   --   --  9.6*  HCT 32.9*  --   --  30.0*  --   --   --   --  31.7*  PLT 236  --   --  234  --   --   --   --  223  LABPROT  --  15.0  --   --   --   --   --   --   --   INR  --  1.21  --   --   --   --   --   --   --   HEPARINUNFRC  --   --   < > <0.10*  --  <0.10*  --  0.13* 0.25*  CREATININE 0.82  --   --  0.91  --   --   --   --   --   TROPONINI  --   --   --   --  <0.30  --  <0.30  --   --   < > = values in this interval not displayed.  Estimated Creatinine Clearance: 102.6 ml/min (by C-G formula based on Cr of 0.91).   Medical History: Past Medical History  Diagnosis Date  . Diabetes mellitus   . Hypertension   . Systolic heart failure     EF is 25 to 30%  . Noncompliance   . CAD (coronary artery disease) Sept 2013    s/p cardiac cath showing occlusion of small RCA with collaterals  . Chronic anticoagulation     on coumadin  . Atrial fibrillation     Medications:  See med rec  Assessment: Patient is a 57 y.o homeless man presented to the ED with CP. Patient has known hx CAD on coumadin PTA for afib. It has been 1 week since patient last took coumadin. INR was subtherapeutic on admission. Heparin remains subtherapeutic on 2000 units/hr heparin.  Goal of Therapy:  Heparin level 0.3-0.7 units/ml Monitor platelets by anticoagulation  protocol: Yes   Plan:  1) Increase heparin drip to 2150 units/hr 2) check 6 hour heparin level  Talbert Cage, PharmD Clinical Pharmacist 01/09/2013 3:01 AM

## 2013-01-09 NOTE — Progress Notes (Signed)
ANTICOAGULATION CONSULT NOTE - Follow Up Consult  Pharmacy Consult for heparin  Indication: atrial fibrillation / CP  No Known Allergies  Patient Measurements: Height: 5\' 7"  (170.2 cm) Weight: 214 lb 15.2 oz (97.5 kg) IBW/kg (Calculated) : 66.1 Heparin Dosing Weight: 87 kg  Vital Signs: Temp: 97.9 F (36.6 C) (10/18 1500) Temp src: Oral (10/18 1500) BP: 105/67 mmHg (10/18 1500) Pulse Rate: 94 (10/18 1500)  Labs:  Recent Labs  01/07/13 1459 01/07/13 1943 01/08/13 0234 01/08/13 0908  01/08/13 1522  01/09/13 0200 01/09/13 1045 01/09/13 1850  HGB 10.0*  --  9.1*  --   --   --   --  9.6*  --   --   HCT 32.9*  --  30.0*  --   --   --   --  31.7*  --   --   PLT 236  --  234  --   --   --   --  223  --   --   LABPROT  --  15.0  --   --   --   --   --   --   --   --   INR  --  1.21  --   --   --   --   --   --   --   --   HEPARINUNFRC  --   --  <0.10*  --   < >  --   < > 0.25* 0.30 0.33  CREATININE 0.82  --  0.91  --   --   --   --  0.98  --   --   TROPONINI  --   --   --  <0.30  --  <0.30  --   --   --   --   < > = values in this interval not displayed.  Estimated Creatinine Clearance: 94.8 ml/min (by C-G formula based on Cr of 0.98).   Medications:  Scheduled:  . aspirin EC  81 mg Oral QPC lunch  . atorvastatin  80 mg Oral QPC lunch  . carvedilol  25 mg Oral BID WC  . digoxin  0.125 mg Oral QPC lunch  . furosemide  40 mg Oral BID  . glipiZIDE  5 mg Oral QPC lunch  . insulin aspart  0-15 Units Subcutaneous TID WC  . lisinopril  5 mg Oral Daily  . potassium chloride  10 mEq Oral BID  . sodium chloride  3 mL Intravenous Q12H   Infusions:  . heparin 2,250 Units/hr (01/09/13 1235)    Assessment: 56 YOM presented to the ED with CP. Patient has known hx CAD on coumadin PTA for afib. It has been 1 week since patient last took coumadin. INR was subtherapeutic on admission. Heparin level remains on the low end of normal range at 0.33unit/mL. No bleeding noted.  Goal of  Therapy:  Heparin level 0.3-0.7 units/mL Monitor platelets by anticoagulation protocol: Yes   Plan:  1. Continue heparin at 2250 units/hr 2. Daily heparin level and CBC  Spenser Harren D. Erinn Mendosa, PharmD Clinical Pharmacist Pager: [phone] 01/09/2013 7:35 PM

## 2013-01-09 NOTE — Progress Notes (Signed)
ANTICOAGULATION CONSULT NOTE - Follow Up Consult  Pharmacy Consult for heparin  Indication: atrial fibrillation / CP  No Known Allergies  Patient Measurements: Height: 5\' 7"  (170.2 cm) Weight: 214 lb 15.2 oz (97.5 kg) IBW/kg (Calculated) : 66.1 Heparin Dosing Weight: 87 kg  Vital Signs: Temp: 97.7 F (36.5 C) (10/18 0938) Temp src: Oral (10/18 0938) BP: 109/61 mmHg (10/18 0953) Pulse Rate: 86 (10/18 0938)  Labs:  Recent Labs  01/07/13 1459 01/07/13 1943  01/08/13 0234 01/08/13 0908 01/08/13 1000 01/08/13 1522 01/08/13 1800 01/09/13 0200  HGB 10.0*  --   --  9.1*  --   --   --   --  9.6*  HCT 32.9*  --   --  30.0*  --   --   --   --  31.7*  PLT 236  --   --  234  --   --   --   --  223  LABPROT  --  15.0  --   --   --   --   --   --   --   INR  --  1.21  --   --   --   --   --   --   --   HEPARINUNFRC  --   --   < > <0.10*  --  <0.10*  --  0.13* 0.25*  CREATININE 0.82  --   --  0.91  --   --   --   --  0.98  TROPONINI  --   --   --   --  <0.30  --  <0.30  --   --   < > = values in this interval not displayed.  Estimated Creatinine Clearance: 94.8 ml/min (by C-G formula based on Cr of 0.98).   Medications:  Scheduled:  . aspirin EC  81 mg Oral QPC lunch  . atorvastatin  80 mg Oral QPC lunch  . carvedilol  25 mg Oral BID WC  . digoxin  0.125 mg Oral QPC lunch  . furosemide  40 mg Oral BID  . glipiZIDE  5 mg Oral QPC lunch  . insulin aspart  0-15 Units Subcutaneous TID WC  . lisinopril  5 mg Oral Daily  . potassium chloride  10 mEq Oral BID  . sodium chloride  3 mL Intravenous Q12H   Infusions:  . heparin 2,150 Units/hr (01/09/13 0314)    Assessment: Patient is a 57 y.o homeless man presented to the ED with CP. Patient has known hx CAD on coumadin PTA for afib. It has been 1 week since patient last took coumadin. INR was subtherapeutic on admission.  Current heparin level is therapeutic at 0.3 but on low side  Goal of Therapy:  Heparin level 0.3-0.7  units/mL Monitor platelets by anticoagulation protocol: Yes   Plan:  1) Increase heparin to 2250 units/hr.  6hr heparin level 2) Daily heparin level and CBC  Elye Harmsen, Tsz-Yin 01/09/2013,11:07 AM

## 2013-01-09 NOTE — Progress Notes (Addendum)
Primary cardiologist: Dr. Delane Ginger  Subjective:   Complains of intermittent chest pain, no palpitations.   Objective:   Temp:  [97.3 F (36.3 C)-98.6 F (37 C)] 97.4 F (36.3 C) (10/18 0506) Pulse Rate:  [82-104] 82 (10/18 0506) Resp:  [19-22] 20 (10/18 0506) BP: (110-125)/(61-74) 116/64 mmHg (10/18 0506) SpO2:  [98 %-100 %] 98 % (10/18 0506) Weight:  [214 lb 15.2 oz (97.5 kg)] 214 lb 15.2 oz (97.5 kg) (10/18 0506) Last BM Date: 01/08/14  Filed Weights   01/07/13 2131 01/08/13 0609 01/09/13 0506  Weight: 221 lb 6.4 oz (100.426 kg) 217 lb 3.2 oz (98.521 kg) 214 lb 15.2 oz (97.5 kg)    Intake/Output Summary (Last 24 hours) at 01/09/13 0917 Last data filed at 01/09/13 0836  Gross per 24 hour  Intake    843 ml  Output   1450 ml  Net   -607 ml    Telemetry: Atrial fibrillation with controlled rate.  Exam:  General: Appears comfortable.  Lungs: Nonlabored breathing. Clear.  Cardiac: Irregularly irregular, no gallop.  Extremities: No pitting.  Lab Results:  Basic Metabolic Panel:  Recent Labs Lab 01/07/13 1459 01/08/13 0234 01/09/13 0200  NA 135 136 140  K 4.2 3.8 3.8  CL 99 100 102  CO2 28 27 28   GLUCOSE 143* 254* 152*  BUN 15 15 16   CREATININE 0.82 0.91 0.98  CALCIUM 9.0 8.3* 8.2*    Liver Function Tests:  Recent Labs Lab 01/08/13 0234  AST 8  ALT 10  ALKPHOS 128*  BILITOT 0.3  PROT 6.5  ALBUMIN 2.9*    CBC:  Recent Labs Lab 01/07/13 1459 01/08/13 0234 01/09/13 0200  WBC 7.8 7.2 7.9  HGB 10.0* 9.1* 9.6*  HCT 32.9* 30.0* 31.7*  MCV 76.2* 75.9* 75.8*  PLT 236 234 223    Cardiac Enzymes:  Recent Labs Lab 01/08/13 0908 01/08/13 1522  TROPONINI <0.30 <0.30    BNP:  Recent Labs  01/13/12 1528 01/07/13 1609  PROBNP 1267.0* 2389.0*    Coagulation:  Recent Labs Lab 01/07/13 1943  INR 1.21     Medications:   Scheduled Medications: . aspirin EC  81 mg Oral QPC lunch  . atorvastatin  80 mg Oral QPC lunch    . carvedilol  25 mg Oral BID WC  . digoxin  0.125 mg Oral QPC lunch  . furosemide  40 mg Oral BID  . glipiZIDE  5 mg Oral QPC lunch  . insulin aspart  0-15 Units Subcutaneous TID WC  . lisinopril  5 mg Oral Daily  . potassium chloride  10 mEq Oral BID  . sodium chloride  3 mL Intravenous Q12H     Infusions: . heparin 2,150 Units/hr (01/09/13 0314)     PRN Medications:  sodium chloride, acetaminophen, nitroGLYCERIN, ondansetron (ZOFRAN) IV, sodium chloride   Assessment:   1. Atrial fibrillation, chronic. At this point on heparin and Coumadin - although not a candidate for long-term Coumadin due to documented noncompliance with followup and social situation.Marland Kitchen Heart rate under better control with resumption of medications.  2. Atypical chest pain, cardiac markers argue against ACS. Not clear that his symptoms are ischemic in origin. Will followup ECG.  3. Acute on chronic systolic heart failure, LVEF 40-45% by prior assessment with diffuse hypokinesis. Reportedly status post ICD placement in [place] in May of this year. Chest x-ray from 10/16 showed bilateral pulmonary edema with vascular congestion and tiny pleural effusions.  4. 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  5. Very difficult social situation, patient homeless, noncompliant with medical therapy and followup.   Plan/Discussion:    Met patient on rounds this morning, review chart. He is hemodynamically stable with better heart rate control in atrial fibrillation following resumption of medications. Diuresis continues on oral Lasix. Biggest issue remains his social situation. Input is greatly needed from social work regarding reasonable disposition plans, and whether he will be able to obtain any assistance with medications, particularly a novel anticoagulant since he is not a candidate for Coumadin optimally. If he is going to be residing in this area, he will need to maintain cardiology followup, has seen  Dr. Elease Hashimoto in clinic previously.   Jonelle Sidle, M.D., F.A.C.C.

## 2013-01-10 ENCOUNTER — Inpatient Hospital Stay (HOSPITAL_COMMUNITY): Payer: Medicaid - Out of State

## 2013-01-10 LAB — BASIC METABOLIC PANEL
CO2: 29 mEq/L (ref 19–32)
Creatinine, Ser: 0.96 mg/dL (ref 0.50–1.35)
Glucose, Bld: 102 mg/dL — ABNORMAL HIGH (ref 70–99)
Potassium: 3.7 mEq/L (ref 3.5–5.1)
Sodium: 139 mEq/L (ref 135–145)

## 2013-01-10 LAB — CBC
Hemoglobin: 9.4 g/dL — ABNORMAL LOW (ref 13.0–17.0)
MCH: 22.7 pg — ABNORMAL LOW (ref 26.0–34.0)
MCV: 75.6 fL — ABNORMAL LOW (ref 78.0–100.0)
RBC: 4.14 MIL/uL — ABNORMAL LOW (ref 4.22–5.81)

## 2013-01-10 LAB — GLUCOSE, CAPILLARY
Glucose-Capillary: 95 mg/dL (ref 70–99)
Glucose-Capillary: 161 mg/dL — ABNORMAL HIGH (ref 70–99)

## 2013-01-10 LAB — HEPARIN LEVEL (UNFRACTIONATED): Heparin Unfractionated: 0.43 IU/mL (ref 0.30–0.70)

## 2013-01-10 NOTE — Progress Notes (Signed)
ANTICOAGULATION CONSULT NOTE - Follow Up Consult  Pharmacy Consult for heparin  Indication: atrial fibrillation  No Known Allergies  Patient Measurements: Height: 5\' 7"  (170.2 cm) Weight: 213 lb 10 oz (96.9 kg) (scale B) IBW/kg (Calculated) : 66.1  Vital Signs: Temp: 97.8 F (36.6 C) (10/19 0949) Temp src: Oral (10/19 0949) BP: 114/59 mmHg (10/19 0949) Pulse Rate: 82 (10/19 0949)  Labs:  Recent Labs  01/07/13 1943 01/08/13 0234 01/08/13 0908  01/08/13 1522  01/09/13 0200 01/09/13 1045 01/09/13 1850 01/10/13 0606  HGB  --  9.1*  --   --   --   --  9.6*  --   --  9.4*  HCT  --  30.0*  --   --   --   --  31.7*  --   --  31.3*  PLT  --  234  --   --   --   --  223  --   --  241  LABPROT 15.0  --   --   --   --   --   --   --   --   --   INR 1.21  --   --   --   --   --   --   --   --   --   HEPARINUNFRC  --  <0.10*  --   < >  --   < > 0.25* 0.30 0.33 0.43  CREATININE  --  0.91  --   --   --   --  0.98  --   --  0.96  TROPONINI  --   --  <0.30  --  <0.30  --   --   --   --   --   < > = values in this interval not displayed.  Estimated Creatinine Clearance: 96.4 ml/min (by C-G formula based on Cr of 0.96).   Medications:  Scheduled:  . aspirin EC  81 mg Oral QPC lunch  . atorvastatin  80 mg Oral QPC lunch  . carvedilol  25 mg Oral BID WC  . digoxin  0.125 mg Oral QPC lunch  . furosemide  40 mg Oral BID  . glipiZIDE  5 mg Oral QPC lunch  . insulin aspart  0-15 Units Subcutaneous TID WC  . lisinopril  5 mg Oral Daily  . potassium chloride  10 mEq Oral BID  . sodium chloride  3 mL Intravenous Q12H   Infusions:  . heparin 2,250 Units/hr (01/10/13 0422)    Assessment: 57 yo male with hx of afib is currently on therapeutic heparin.  Heparin level was 0.43 Goal of Therapy:  Heparin level 0.3-0.7 units/ml Monitor platelets by anticoagulation protocol: Yes   Plan:  1) Cont heparin to 2250 units/hr.  2) daily heparin level and CBC 3) follow transition to  NOAC  Cydnie Deason, Tsz-Yin 01/10/2013,10:42 AM

## 2013-01-10 NOTE — Progress Notes (Signed)
Primary cardiologist: Dr. Delane Ginger  Subjective:   Complains of a chest discomfort that is pleuritic in description, worse when he takes a deep breath or twists.   Objective:   Temp:  [97.7 F (36.5 C)-98.6 F (37 C)] 98.6 F (37 C) (10/19 0514) Pulse Rate:  [77-94] 77 (10/19 0514) Resp:  [20] 20 (10/19 0514) BP: (104-115)/(55-67) 115/55 mmHg (10/19 0514) SpO2:  [96 %-97 %] 97 % (10/19 0514) Weight:  [213 lb 10 oz (96.9 kg)] 213 lb 10 oz (96.9 kg) (10/19 0514) Last BM Date: 01/08/13  Filed Weights   01/08/13 0609 01/09/13 0506 01/10/13 0514  Weight: 217 lb 3.2 oz (98.521 kg) 214 lb 15.2 oz (97.5 kg) 213 lb 10 oz (96.9 kg)    Intake/Output Summary (Last 24 hours) at 01/10/13 0853 Last data filed at 01/10/13 0514  Gross per 24 hour  Intake    840 ml  Output   2200 ml  Net  -1360 ml    Telemetry: Atrial fibrillation with controlled rate.  Exam:  General: Appears comfortable.  Lungs: Nonlabored breathing. Rhonchorous.  Cardiac: Irregularly irregular, no gallop.  Extremities: No pitting.  Lab Results:  Basic Metabolic Panel:  Recent Labs Lab 01/08/13 0234 01/09/13 0200 01/10/13 0606  NA 136 140 139  K 3.8 3.8 3.7  CL 100 102 100  CO2 27 28 29   GLUCOSE 254* 152* 102*  BUN 15 16 16   CREATININE 0.91 0.98 0.96  CALCIUM 8.3* 8.2* 8.5    Liver Function Tests:  Recent Labs Lab 01/08/13 0234  AST 8  ALT 10  ALKPHOS 128*  BILITOT 0.3  PROT 6.5  ALBUMIN 2.9*    CBC:  Recent Labs Lab 01/08/13 0234 01/09/13 0200 01/10/13 0606  WBC 7.2 7.9 7.6  HGB 9.1* 9.6* 9.4*  HCT 30.0* 31.7* 31.3*  MCV 75.9* 75.8* 75.6*  PLT 234 223 241    Cardiac Enzymes:  Recent Labs Lab 01/08/13 0908 01/08/13 1522  TROPONINI <0.30 <0.30    BNP:  Recent Labs  01/13/12 1528 01/07/13 1609  PROBNP 1267.0* 2389.0*    Coagulation:  Recent Labs Lab 01/07/13 1943  INR 1.21     Medications:   Scheduled Medications: . aspirin EC  81 mg Oral  QPC lunch  . atorvastatin  80 mg Oral QPC lunch  . carvedilol  25 mg Oral BID WC  . digoxin  0.125 mg Oral QPC lunch  . furosemide  40 mg Oral BID  . glipiZIDE  5 mg Oral QPC lunch  . insulin aspart  0-15 Units Subcutaneous TID WC  . lisinopril  5 mg Oral Daily  . potassium chloride  10 mEq Oral BID  . sodium chloride  3 mL Intravenous Q12H    Infusions: . heparin 2,250 Units/hr (01/10/13 0422)    PRN Medications: sodium chloride, acetaminophen, nitroGLYCERIN, ondansetron (ZOFRAN) IV, sodium chloride   Assessment:   1. Atrial fibrillation, chronic. At this point on heparin and Coumadin - although not a candidate for long-term Coumadin due to documented noncompliance with followup and social situation.Marland Kitchen Heart rate under better control with resumption of medications.  2. Atypical chest pain, cardiac markers argue against ACS. Not clear that his symptoms are ischemic in origin. Pleuritic in description, possibly related to edema and effusions. Plan to followup chest x-ray.  3. Acute on chronic systolic heart failure, LVEF 40-45% by prior assessment with diffuse hypokinesis. Reportedly status post ICD placement in [place] in May of this year. He continues  to diuresis.  4. CAD status post CABG in July 2014 at [facility].  5. Very difficult social situation, patient homeless, noncompliant with medical therapy and followup.   Plan/Discussion:    Plan to followup chest x-ray today. He continues to diuresis on oral Lasix. Biggest issue remains his social situation. Input is greatly needed from social work regarding reasonable disposition plans, and whether he will be able to obtain any assistance with medications, particularly a novel anticoagulant since he is not a candidate for Coumadin optimally. If he is going to be residing in this area, he will need to maintain cardiology followup, has seen Dr. Elease Hashimoto in clinic previously.   Jonelle Sidle, M.D., F.A.C.C.

## 2013-01-11 DIAGNOSIS — D509 Iron deficiency anemia, unspecified: Secondary | ICD-10-CM

## 2013-01-11 DIAGNOSIS — Z590 Homelessness: Secondary | ICD-10-CM

## 2013-01-11 DIAGNOSIS — I5043 Acute on chronic combined systolic (congestive) and diastolic (congestive) heart failure: Principal | ICD-10-CM

## 2013-01-11 LAB — CBC
HCT: 32.3 % — ABNORMAL LOW (ref 39.0–52.0)
MCH: 22.5 pg — ABNORMAL LOW (ref 26.0–34.0)
MCHC: 30 g/dL (ref 30.0–36.0)
MCV: 74.9 fL — ABNORMAL LOW (ref 78.0–100.0)
Platelets: 229 10*3/uL (ref 150–400)
RDW: 20.3 % — ABNORMAL HIGH (ref 11.5–15.5)
WBC: 6.5 10*3/uL (ref 4.0–10.5)

## 2013-01-11 LAB — BASIC METABOLIC PANEL
BUN: 16 mg/dL (ref 6–23)
CO2: 28 mEq/L (ref 19–32)
Calcium: 8.8 mg/dL (ref 8.4–10.5)
Chloride: 102 mEq/L (ref 96–112)
Creatinine, Ser: 0.96 mg/dL (ref 0.50–1.35)
GFR calc non Af Amer: >90 mL/min (ref >90)
Glucose, Bld: 107 mg/dL — ABNORMAL HIGH (ref 70–99)
Sodium: 139 mEq/L (ref 135–145)

## 2013-01-11 LAB — GLUCOSE, CAPILLARY
Glucose-Capillary: 113 mg/dL — ABNORMAL HIGH (ref 70–99)
Glucose-Capillary: 156 mg/dL — ABNORMAL HIGH (ref 70–99)

## 2013-01-11 NOTE — Progress Notes (Signed)
ANTICOAGULATION CONSULT NOTE - Follow Up Consult  Pharmacy Consult:  Heparin  Indication: atrial fibrillation  No Known Allergies  Patient Measurements: Height: 5\' 7"  (170.2 cm) Weight: 210 lb 5.1 oz (95.4 kg) IBW/kg (Calculated) : 66.1  Vital Signs: Temp: 97.9 F (36.6 C) (10/20 0520) Temp src: Oral (10/20 0520) BP: 113/93 mmHg (10/20 0520) Pulse Rate: 67 (10/20 0520)  Labs:  Recent Labs  01/08/13 1522  01/09/13 0200  01/09/13 1850 01/10/13 0606 01/11/13 0606  HGB  --   < > 9.6*  --   --  9.4* 9.7*  HCT  --   --  31.7*  --   --  31.3* 32.3*  PLT  --   --  223  --   --  241 229  HEPARINUNFRC  --   < > 0.25*  < > 0.33 0.43 0.45  CREATININE  --   --  0.98  --   --  0.96 0.96  TROPONINI <0.30  --   --   --   --   --   --   < > = values in this interval not displayed.  Estimated Creatinine Clearance: 95.7 ml/min (by C-G formula based on Cr of 0.96).    Assessment: 57 yo male with history of Afib to continue on IV heparin.  Heparin level therapeutic; no bleeding reported.    Goal of Therapy:  Heparin level 0.3-0.7 units/ml Monitor platelets by anticoagulation protocol: Yes    Plan:  - Continue IV heparin at 2250 units/hr - Daily HL / CBC - F/U long-term AC plans    Sydnei Ohaver D. Laney Potash, PharmD, BCPS Pager:  [phone] 01/11/2013, 1:38 PM

## 2013-01-11 NOTE — Progress Notes (Signed)
Patient: Jacob [name] / Admit Date: 01/07/2013 / Date of Encounter: 01/11/2013, 7:49 AM   Subjective  No complaints. No further CP, SOB. LEE resolved. Plans on staying in [place] area after discharge but awaiting CM/SW input regarding homelessness.  Objective   Telemetry: atrial fib controlled rate. 12 beats NSVT overnight.  Physical Exam: Blood pressure 113/93, pulse 67, temperature 97.9 F (36.6 C), temperature source Oral, resp. rate 20, height 5\' 7"  (1.702 m), weight 210 lb 5.1 oz (95.4 kg), SpO2 100.00%. General: Well developed, well nourished, in no acute distress. Head: Normocephalic, atraumatic, sclera non-icteric, no xanthomas, nares are without discharge. Neck: JVP not elevated. Lungs: Generally diminished BS throughout but no wheezes, rales, or rhonchi. Breathing is unlabored. Heart: Irregularly irregular, controlled rate, S1 S2 without murmurs, rubs, or gallops.  Abdomen: Soft, non-tender, non-distended with normoactive bowel sounds. No rebound/guarding. Extremities: No clubbing or cyanosis. No edema. Distal pedal pulses are 2+ and equal bilaterally. Neuro: Alert and oriented X 3. Moves all extremities spontaneously. Psych:  Responds to questions appropriately with a normal affect.   Intake/Output Summary (Last 24 hours) at 01/11/13 0749 Last data filed at 01/11/13 0500  Gross per 24 hour  Intake    600 ml  Output   2825 ml  Net  -2225 ml    Inpatient Medications:  . aspirin EC  81 mg Oral QPC lunch  . atorvastatin  80 mg Oral QPC lunch  . carvedilol  25 mg Oral BID WC  . digoxin  0.125 mg Oral QPC lunch  . furosemide  40 mg Oral BID  . glipiZIDE  5 mg Oral QPC lunch  . insulin aspart  0-15 Units Subcutaneous TID WC  . lisinopril  5 mg Oral Daily  . potassium chloride  10 mEq Oral BID  . sodium chloride  3 mL Intravenous Q12H   Infusions:  . heparin 2,250 Units/hr (01/11/13 0427)    Labs:  Recent Labs  01/10/13 0606 01/11/13 0606  NA 139 139  K  3.7 3.9  CL 100 102  CO2 29 28  GLUCOSE 102* 107*  BUN 16 16  CREATININE 0.96 0.96  CALCIUM 8.5 8.8    Recent Labs  01/10/13 0606 01/11/13 0606  WBC 7.6 6.5  HGB 9.4* 9.7*  HCT 31.3* 32.3*  MCV 75.6* 74.9*  PLT 241 229    Recent Labs  01/08/13 0908 01/08/13 1522  TROPONINI <0.30 <0.30    Recent Labs  01/08/13 1000  HGBA1C 6.8*     Radiology/Studies:  Dg Chest 2 View  01/10/2013   CLINICAL DATA:  Chest pain with shortness of Breath.  EXAM: CHEST  2 VIEW  COMPARISON:  01/07/2013  FINDINGS: Central vascular congestion and bilateral interstitial thickening has essentially resolved. No significant pleural fluid is seen.  Cardiac silhouette is mildly enlarged. Changes from prior CABG surgery are stable. The mediastinum is normal in contour.  Left anterior chest wall AICD is stable.  IMPRESSION: Resolved congestive heart failure.   Electronically Signed   By: Amie Portland M.D.   On: 01/10/2013 10:29   Dg Chest 2 View  01/07/2013   CLINICAL DATA:  Chest pain. Cough.  EXAM: CHEST  2 VIEW  COMPARISON:  05/17/2012  FINDINGS: The patient has developed bilateral pulmonary edema and pulmonary vascular congestion as well as tiny bilateral pleural effusions. There is borderline cardiomegaly. AICD in place. Prior CABG.  IMPRESSION: Congestive heart failure.   Electronically Signed   By: Geanie Cooley M.D.   On:  01/07/2013 15:55     Assessment and Plan  1. Acute on chronic systolic and diastolic CHF, improved - continue current meds. NSVT on tele. H/o ICD placement in [place] in May 2014 per report. Cont BB. 2. Chronic atrial fibrillation - rate controlled. In context of anemia/homelessness/documented noncompliance, has been reported to not be a Coumadin candidate. However, is continued on heparin here. Will need to get Dr. Harvie Bridge input regarding finalized decision on anticoag. Care management consult is really the biggest thing pending.  3. Homelessness and poor social situation - care  management has been consulted. Will also consult social work. 4. Atypical chest pain, felt to be MSK vs pleuritic - enzymes neg thus far. Will ask nursing to obtain EKG as ordered this AM per Dr. Ival Bible note from yesterday. 5. CAD s/p CABG 09/2012 at [facility] - continue ASA, statin, BB.  6. Microcytic anemia - Hgb last yr at this time was 14. Panel: low iron, borderline low ferritin, % saturation. May be slow to recover from CABG in 09/2012 but could consider additional eval. Pt denies bleeding. 7. Diabetes mellitus - continue glipizide.  Signed, Ronie Spies PA-C  Attending Note:   The patient was seen and examined.  Agree with assessment and plan as noted above.  Changes made to the above note as needed.  I do not think it is safe for him to be on coumadin - he has no way to safely monitor his INR levels.   In addition, he has microcytic anemia and I would worry about a  NOAC with the possibility of a slow GI bleed.   Will be seen by case manager today.  He should be able to go home soon.  Vesta Mixer, Montez Hageman., MD, [facility] 01/11/2013, 8:22 AM

## 2013-01-12 LAB — GLUCOSE, CAPILLARY: Glucose-Capillary: 93 mg/dL (ref 70–99)

## 2013-01-12 MED ORDER — FUROSEMIDE 40 MG PO TABS: 40.0000 mg | ORAL_TABLET | Freq: Two times a day (BID) | ORAL | Status: DC

## 2013-01-12 MED ORDER — POTASSIUM CHLORIDE CRYS ER 10 MEQ PO TBCR: 10.0000 meq | EXTENDED_RELEASE_TABLET | Freq: Two times a day (BID) | ORAL | Status: DC

## 2013-01-12 MED ORDER — LOVASTATIN 20 MG PO TABS: 20.0000 mg | ORAL_TABLET | Freq: Every day | ORAL | Status: DC

## 2013-01-12 MED ORDER — ATORVASTATIN CALCIUM 80 MG PO TABS: 80.0000 mg | ORAL_TABLET | Freq: Every day | ORAL | Status: DC

## 2013-01-12 MED ORDER — CARVEDILOL 25 MG PO TABS: 25.0000 mg | ORAL_TABLET | Freq: Two times a day (BID) | ORAL | Status: DC

## 2013-01-12 MED ORDER — NITROGLYCERIN 0.4 MG SL SUBL: 0.4000 mg | SUBLINGUAL_TABLET | SUBLINGUAL | Status: DC | PRN

## 2013-01-12 MED ORDER — LISINOPRIL 5 MG PO TABS: 5.0000 mg | ORAL_TABLET | Freq: Every day | ORAL | Status: DC

## 2013-01-12 NOTE — Progress Notes (Signed)
Pt removed tele, and is taking shower. Rn inform Pt that he did not have order. Pt was pleasant, but stated he was going to shower.

## 2013-01-12 NOTE — Progress Notes (Signed)
Patient ID: Jacob [name], male   DOB: 05-14-1955, 57 y.o.   MRN: [id number]    SUBJECTIVE    the patient is improved and stable this morning. I do not see a formal note from case management in the record, but the patient tells me that he has spoken with people giving him instructions on how to get followup help outside the hospital. He is ready to go home today.   Filed Vitals:   01/11/13 0520 01/11/13 1418 01/11/13 2148 01/12/13 0603  BP: 113/93 103/49 117/60 130/65  Pulse: 67 89 82 87  Temp: 97.9 F (36.6 C) 98 F (36.7 C) 98.5 F (36.9 C) 98 F (36.7 C)  TempSrc: Oral Oral Oral Oral  Resp: 20 20 18 18   Height:      Weight: 210 lb 5.1 oz (95.4 kg)   209 lb (94.802 kg)  SpO2: 100% 99% 100% 99%     Intake/Output Summary (Last 24 hours) at 01/12/13 0729 Last data filed at 01/12/13 0439  Gross per 24 hour  Intake   1200 ml  Output   2200 ml  Net  -1000 ml    LABS: Basic Metabolic Panel:  Recent Labs  40/98/11 0606 01/11/13 0606  NA 139 139  K 3.7 3.9  CL 100 102  CO2 29 28  GLUCOSE 102* 107*  BUN 16 16  CREATININE 0.96 0.96  CALCIUM 8.5 8.8   Liver Function Tests: No results found for this basename: AST, ALT, ALKPHOS, BILITOT, PROT, ALBUMIN,  in the last 72 hours No results found for this basename: LIPASE, AMYLASE,  in the last 72 hours CBC:  Recent Labs  01/10/13 0606 01/11/13 0606  WBC 7.6 6.5  HGB 9.4* 9.7*  HCT 31.3* 32.3*  MCV 75.6* 74.9*  PLT 241 229   Cardiac Enzymes: No results found for this basename: CKTOTAL, CKMB, CKMBINDEX, TROPONINI,  in the last 72 hours BNP: No components found with this basename: POCBNP,  D-Dimer: No results found for this basename: DDIMER,  in the last 72 hours Hemoglobin A1C: No results found for this basename: HGBA1C,  in the last 72 hours Fasting Lipid Panel: No results found for this basename: CHOL, HDL, LDLCALC, TRIG, CHOLHDL, LDLDIRECT,  in the last 72 hours Thyroid Function Tests: No results found for this  basename: TSH, T4TOTAL, FREET3, T3FREE, THYROIDAB,  in the last 72 hours  RADIOLOGY: Dg Chest 2 View  01/10/2013   CLINICAL DATA:  Chest pain with shortness of Breath.  EXAM: CHEST  2 VIEW  COMPARISON:  01/07/2013  FINDINGS: Central vascular congestion and bilateral interstitial thickening has essentially resolved. No significant pleural fluid is seen.  Cardiac silhouette is mildly enlarged. Changes from prior CABG surgery are stable. The mediastinum is normal in contour.  Left anterior chest wall AICD is stable.  IMPRESSION: Resolved congestive heart failure.   Electronically Signed   By: Amie Portland M.D.   On: 01/10/2013 10:29   Dg Chest 2 View  01/07/2013   CLINICAL DATA:  Chest pain. Cough.  EXAM: CHEST  2 VIEW  COMPARISON:  05/17/2012  FINDINGS: The patient has developed bilateral pulmonary edema and pulmonary vascular congestion as well as tiny bilateral pleural effusions. There is borderline cardiomegaly. AICD in place. Prior CABG.  IMPRESSION: Congestive heart failure.   Electronically Signed   By: Geanie Cooley M.D.   On: 01/07/2013 15:55    PHYSICAL EXAM     Patient is oriented to person time and place. Affect is normal.  Lungs reveal scattered rhonchi. Cardiac exam reveals S1 and S2. There is no peripheral edema.   TELEMETRY:     I have reviewed telemetry today January 12, 2013. There is normal sinus rhythm. There scattered PVCs.   ASSESSMENT AND PLAN:    Acute on chronic combined systolic and diastolic heart failure     He is improved. He is ready to go home.    HYPERTENSION   Chronic atrial fibrillation   DM (diabetes mellitus)   Noncompliance   Coronary artery disease   Musculoskeletal chest pain   Homelessness   Microcytic anemia   Willa Rough 01/12/2013 7:29 AM

## 2013-01-12 NOTE — Progress Notes (Signed)
Patient d/c'd today. He states that he has a friend whom he will ask if he can stay with for a while. Patient does not have the friend's phone number.  He is hoping, that if he pays part of the rent, that his friend will let him stay.  Patient was very anxious today to leave to work on this.  He states that he will call CSW if he can make this arrangement and CSW will approach pastoral care to see if we can assist him with rent.  CSW signing off for now.  Lorri Frederick. [place]  [phone]

## 2013-01-12 NOTE — Discharge Summary (Signed)
CARDIOLOGY DISCHARGE SUMMARY   Patient ID: Jacob [name] MRN: [id number] DOB/AGE: 1955/05/12 57 y.o.  Admit date: 01/07/2013 Discharge date: 01/12/2013  Primary Discharge Diagnosis:   Acute on chronic combined systolic and diastolic heart failure - discharge weight is 209 lbs Secondary Discharge Diagnosis:    HYPERTENSION   Chronic atrial fibrillation   DM (diabetes mellitus)   Noncompliance   Coronary artery disease   Musculoskeletal chest pain   Homelessness   Microcytic anemia   Consults:   Procedures:    Hospital Course: Jacob [name] is a 57 y.o. male with a history of CAD. He had an ICD placed in [place] and CABG at [facility] in July. He was hospitalized in September in [place] with CHF. He was living at a homeless shelter. He was wakened by chest pain on the day of admission. It was pleuritic in nature, but when he came to the ER, he was found to be in heart failure and was admitted for further evaluation and treatment.  1. Acute on Chronic Combined systolic and diastolic heart failure:  He was diuresed with IV Lasix. His weight decreased by 12 lbs during his hospital stay. His discharge weight is 209 lbs. He was changed to PO Lasix once his volume status improved. He required extensive potassium supplementation, which will continue at discharge.   2. HTN: His BP was elevated on admission and improved with the medication changes that were made. Consideration was given to cost and all medications are on the [facility] $4 list, if possible. By discharge, his BP was under good control. His ACE was added, BB was increased, Lasix was increased.   3. Chronic atrial fibrillation: His heart rate was elevated on admission. His Coreg was increased and his heart rate control improved.  4. Anticoagulation: He had been on Coumadin prior to admission was 1.21. Compliance was limited by finances. The patient admitted he did not have the money to come in for Coumadin checks and was  not always compliant with dosing. He was not felt to be a good candidate for NOAC due to anemia and the risk of GI bleed.   5. Anemia - his iron level was checked and was low. He is encouraged to add a multivitamin with iron to his medications.   6. Diabetes: His A1c was checked and was elevated at 6.8. He was continued on sliding scale insulin while in-hospital. He is to resume his glipizide at discharge.  7. Musculoskeletal chest pain: His cardiac enzymes were cycled and were negative for MI. His pain was atypical and consistent with MS pain. It resolved with pain medications.  8. Non-compliance and homelessness: pt was homeless and staying in a shelter. He was seen by Care Management and options were made available to him. $4 Rx was used whenever possible. He was not consistent in stating he would fill his medications, but is encouraged to do so.   By 01/12/2013, Jacob [name] respiratory status was felt to be good. He was ambulating without chest pain or SOB. Care management had seen him and information given to him. Dr. Myrtis Ser evaluated Jacob. [name] and considered him stable for discharge, to follow up as and outpatient.  Labs:   Lab Results  Component Value Date   WBC 6.5 01/11/2013   HGB 9.7* 01/11/2013   HCT 32.3* 01/11/2013   MCV 74.9* 01/11/2013   PLT 229 01/11/2013    Recent Labs Lab 01/08/13 0234  01/11/13 0606  NA 136  < >  139  K 3.8  < > 3.9  CL 100  < > 102  CO2 27  < > 28  BUN 15  < > 16  CREATININE 0.91  < > 0.96  CALCIUM 8.3*  < > 8.8  PROT 6.5  --   --   BILITOT 0.3  --   --   ALKPHOS 128*  --   --   ALT 10  --   --   AST 8  --   --   GLUCOSE 254*  < > 107*  < > = values in this interval not displayed.  Lab Results  Component Value Date   TROPONINI <0.30 01/08/2013    Lipid Panel     Component Value Date/Time   CHOL 114 12/01/2011 0532   TRIG 51 12/01/2011 0532   HDL 33* 12/01/2011 0532   CHOLHDL 3.5 12/01/2011 0532   VLDL 10 12/01/2011 0532   LDLCALC 71 12/01/2011  0532    Pro B Natriuretic peptide (BNP)  Date/Time Value Range Status  01/07/2013  4:09 PM 2389.0* 0 - 125 pg/mL Final  01/13/2012  3:28 PM 1267.0* 0.0 - 100.0 pg/mL Final   Lab Results  Component Value Date   IRON 16* 01/08/2013   TIBC 265 01/08/2013   FERRITIN 34 01/08/2013   Lab Results  Component Value Date   HGBA1C 6.8* 01/08/2013      Radiology: Dg Chest 2 View 01/10/2013   CLINICAL DATA:  Chest pain with shortness of Breath.  EXAM: CHEST  2 VIEW  COMPARISON:  01/07/2013  FINDINGS: Central vascular congestion and bilateral interstitial thickening has essentially resolved. No significant pleural fluid is seen.  Cardiac silhouette is mildly enlarged. Changes from prior CABG surgery are stable. The mediastinum is normal in contour.  Left anterior chest wall AICD is stable.  IMPRESSION: Resolved congestive heart failure.   Electronically Signed   By: Amie Portland M.D.   On: 01/10/2013 10:29   Dg Chest 2 View 01/07/2013   CLINICAL DATA:  Chest pain. Cough.  EXAM: CHEST  2 VIEW  COMPARISON:  05/17/2012  FINDINGS: The patient has developed bilateral pulmonary edema and pulmonary vascular congestion as well as tiny bilateral pleural effusions. There is borderline cardiomegaly. AICD in place. Prior CABG.  IMPRESSION: Congestive heart failure.   Electronically Signed   By: Geanie Cooley M.D.   On: 01/07/2013 15:55   EKG: atrial fib, rapid ventricular response.  FOLLOW UP PLANS AND APPOINTMENTS No Known Allergies   Medication List    STOP taking these medications       atorvastatin 80 MG tablet  Commonly known as:  LIPITOR     warfarin 5 MG tablet  Commonly known as:  COUMADIN      TAKE these medications       aspirin EC 81 MG tablet  Take 81 mg by mouth daily after lunch.     carvedilol 25 MG tablet  Commonly known as:  COREG  Take 1 tablet (25 mg total) by mouth 2 (two) times daily with a meal.     digoxin 0.125 MG tablet  Commonly known as:  LANOXIN  Take 0.125 mg by  mouth daily after lunch.     furosemide 40 MG tablet  Commonly known as:  LASIX  Take 1 tablet (40 mg total) by mouth 2 (two) times daily.     glipiZIDE 5 MG tablet  Commonly known as:  GLUCOTROL  Take 5 mg by mouth daily after  lunch.     lisinopril 5 MG tablet  Commonly known as:  PRINIVIL,ZESTRIL  Take 1 tablet (5 mg total) by mouth daily.     lovastatin 20 MG tablet  Commonly known as:  MEVACOR  Take 1 tablet (20 mg total) by mouth at bedtime.     nitroGLYCERIN 0.4 MG SL tablet  Commonly known as:  NITROSTAT  Place 1 tablet (0.4 mg total) under the tongue every 5 (five) minutes as needed for chest pain.     potassium chloride 10 MEQ tablet  Commonly known as:  K-DUR,KLOR-CON  Take 1 tablet (10 mEq total) by mouth 2 (two) times daily.        Discharge Orders   Future Appointments Provider Department Dept Phone   01/25/2013 8:50 AM Beatrice Lecher, PA-C [facility] [phone]   Future Orders Complete By Expires   Diet - low sodium heart healthy  As directed    Diet Carb Modified  As directed    Increase activity slowly  As directed      Follow-up Information   Follow up with Tereso Newcomer, PA-C On 01/25/2013. (at 8:50 am)    Specialty:  Physician Assistant   Contact information:   [address] [place] [address] [phone]       BRING ALL MEDICATIONS WITH YOU TO FOLLOW UP APPOINTMENTS  Time spent with patient to include physician time: 33 min Signed: Theodore Demark, PA-C 01/12/2013, 3:02 PM Co-Sign MD Patient seen and examined. I agree with the assessment and plan as detailed above. See also my additional thoughts below.   Refer also to my progress note from today. I made the decision for the patient to go home. I have reviewed the information above and I agree. Willa Rough, MD, [facility] 01/12/2013 3:26 PM

## 2013-01-12 NOTE — Progress Notes (Signed)
Pt anxious for d/c was pacing hall way and standing at the nurses station Pt d/c home . D/c instruction and medications reviewed with Pt. Pt states understand. All Pt questions answer. Pt stated he felt dizzy after Rn had reviewed d/c and SWork was talking to Pt about if he had transportation home. Charge Rn assessed VS and Pt went home

## 2013-01-12 NOTE — Progress Notes (Signed)
Pt O4x no complaint of pain or sob. Pt refuse lab and IV per night RN. Will continue to monitor

## 2013-01-12 NOTE — Progress Notes (Signed)
Pt's IV infiltrated this AM. Pt refuses new IV insertion despite RN encouragement. MD made aware. Will pass on to oncoming RN. Will continue to monitor.

## 2013-01-12 NOTE — Progress Notes (Signed)
Pt has also refused AM labs. Will pass on to oncoming RN and continue to monitor.

## 2013-01-19 NOTE — Clinical Social Work Psychosocial (Addendum)
    Clinical Social Work Department BRIEF PSYCHOSOCIAL ASSESSMENT 01/19/2013  Patient:  Jacob [name], Jacob [name]     Account Number:  [id number]     Admit date:  01/07/2013  Clinical Social Worker:  Tiburcio Pea  Date/Time:  01/11/2013 05:30 PM  Referred by:  Physician  Date Referred:  01/11/2013 Referred for  Homelessness   Other Referral:   Interview type:  Patient Other interview type:    PSYCHOSOCIAL DATA Living Status:  ALONE Admitted from facility:   Level of care:   Primary support name:  None Primary support relationship to patient:   Degree of support available:   Estranged from family    CURRENT CONCERNS Current Concerns  Other - See comment   Other Concerns:   Homeless, no disability or income, no family support, New diagnosis of Heart Failure    SOCIAL WORK ASSESSMENT / PLAN CSW met with this very pleasant 56 year old man due to referral for homelessness.  Patient relates that until recently- he was married, with 2 children and he worked full time.  He had a heart attack and was unable to work and provide for his family. Eventually, his wife requested a divorce and he was asked to leave the home. He has been unable to work or find work after a 2nd heart attack and has no money or family left.  Patient wants helps but declines offer of shelter.CSW discussed the [facility] and other possible resources.  Patient states that he has a friend whom he might be able to stay with if he can pay half the rent.  He has applied for disability and medicaid.  CSW will approach Pastoral [facility] to see if they can asisst patient with 1/2 of rent but pateiint would need to provide name of landlord, phone number and company. Patient cannot do this from the hospital and states that he can only contact his friend in person. He is awaiting d/c to do this.  Patient is very unhappy with current status and wants help.   Assessment/plan status:  Information/Referral to [facility] Other assessment/  plan:   Information/referral to community resources:   Psychologist, occupational, Barrister's clerk, [facility] number provided. Offered bus pass (patiient stated he has never used the bus and is not wanting to try at thsi time.    PATIENT'S/FAMILY'S RESPONSE TO PLAN OF CARE: Patient is alert, oriented and extremely pleasant.  He is currently homeless and is very distraught about this. Patient was appreciative of CSW's visits and information provided for assistance. He is attempting to locate a place to stay and plans to approach a friend about this.  Lorri Frederick. Katiya Fike, LCSWA [phone]

## 2013-01-19 NOTE — Progress Notes (Signed)
Patient d/c'd today. He was once again offered shelter but declines. He is going to contact a friend to see if he can stay with him and stated he would call CSW back once he locates his friend and hopefully shelter.  Lorri Frederick. Mirah Nevins, LCSWA  [phone]

## 2013-01-25 ENCOUNTER — Encounter: Payer: Medicaid - Out of State | Admitting: Physician Assistant

## 2013-01-26 ENCOUNTER — Encounter: Payer: Self-pay | Admitting: Physician Assistant

## 2013-02-11 ENCOUNTER — Encounter (HOSPITAL_COMMUNITY): Payer: Self-pay | Admitting: Emergency Medicine

## 2013-02-11 ENCOUNTER — Emergency Department (HOSPITAL_COMMUNITY): Payer: Medicaid - Out of State

## 2013-02-11 ENCOUNTER — Emergency Department (HOSPITAL_COMMUNITY)
Admission: EM | Admit: 2013-02-11 | Discharge: 2013-02-11 | Disposition: A | Discharge status: Home / Self Care | Payer: Medicaid - Out of State | Attending: Emergency Medicine | Admitting: Emergency Medicine

## 2013-02-11 DIAGNOSIS — F17200 Nicotine dependence, unspecified, uncomplicated: Secondary | ICD-10-CM | POA: Insufficient documentation

## 2013-02-11 DIAGNOSIS — R000 Tachycardia, unspecified: Secondary | ICD-10-CM | POA: Insufficient documentation

## 2013-02-11 DIAGNOSIS — T85698A Other mechanical complication of other specified internal prosthetic devices, implants and grafts, initial encounter: Secondary | ICD-10-CM | POA: Insufficient documentation

## 2013-02-11 DIAGNOSIS — Z7982 Long term (current) use of aspirin: Secondary | ICD-10-CM | POA: Insufficient documentation

## 2013-02-11 DIAGNOSIS — I10 Essential (primary) hypertension: Secondary | ICD-10-CM | POA: Insufficient documentation

## 2013-02-11 DIAGNOSIS — Z95810 Presence of automatic (implantable) cardiac defibrillator: Secondary | ICD-10-CM

## 2013-02-11 DIAGNOSIS — Z95818 Presence of other cardiac implants and grafts: Secondary | ICD-10-CM | POA: Insufficient documentation

## 2013-02-11 DIAGNOSIS — Y831 Surgical operation with implant of artificial internal device as the cause of abnormal reaction of the patient, or of later complication, without mention of misadventure at the time of the procedure: Secondary | ICD-10-CM | POA: Insufficient documentation

## 2013-02-11 DIAGNOSIS — Z951 Presence of aortocoronary bypass graft: Secondary | ICD-10-CM | POA: Insufficient documentation

## 2013-02-11 DIAGNOSIS — I2510 Atherosclerotic heart disease of native coronary artery without angina pectoris: Secondary | ICD-10-CM

## 2013-02-11 DIAGNOSIS — I5020 Unspecified systolic (congestive) heart failure: Secondary | ICD-10-CM | POA: Insufficient documentation

## 2013-02-11 DIAGNOSIS — I5043 Acute on chronic combined systolic (congestive) and diastolic (congestive) heart failure: Secondary | ICD-10-CM

## 2013-02-11 DIAGNOSIS — E119 Type 2 diabetes mellitus without complications: Secondary | ICD-10-CM | POA: Diagnosis present

## 2013-02-11 DIAGNOSIS — I5042 Chronic combined systolic (congestive) and diastolic (congestive) heart failure: Secondary | ICD-10-CM | POA: Diagnosis present

## 2013-02-11 DIAGNOSIS — Z4502 Encounter for adjustment and management of automatic implantable cardiac defibrillator: Secondary | ICD-10-CM

## 2013-02-11 DIAGNOSIS — Z79899 Other long term (current) drug therapy: Secondary | ICD-10-CM | POA: Insufficient documentation

## 2013-02-11 DIAGNOSIS — I509 Heart failure, unspecified: Secondary | ICD-10-CM

## 2013-02-11 DIAGNOSIS — I4891 Unspecified atrial fibrillation: Secondary | ICD-10-CM | POA: Diagnosis present

## 2013-02-11 LAB — CBC
HCT: 35.3 % — ABNORMAL LOW (ref 39.0–52.0)
MCH: 21.6 pg — ABNORMAL LOW (ref 26.0–34.0)
MCHC: 30 g/dL (ref 30.0–36.0)
MCV: 72 fL — ABNORMAL LOW (ref 78.0–100.0)
RDW: 19.9 % — ABNORMAL HIGH (ref 11.5–15.5)
WBC: 11.2 10*3/uL — ABNORMAL HIGH (ref 4.0–10.5)

## 2013-02-11 LAB — COMPREHENSIVE METABOLIC PANEL
AST: 14 U/L (ref 0–37)
Albumin: 3.2 g/dL — ABNORMAL LOW (ref 3.5–5.2)
Alkaline Phosphatase: 184 U/L — ABNORMAL HIGH (ref 39–117)
BUN: 15 mg/dL (ref 6–23)
Calcium: 8.6 mg/dL (ref 8.4–10.5)
Creatinine, Ser: 0.87 mg/dL (ref 0.50–1.35)
GFR calc Af Amer: >90 mL/min (ref >90)
Potassium: 3.7 mEq/L (ref 3.5–5.1)
Total Protein: 7.2 g/dL (ref 6.0–8.3)

## 2013-02-11 LAB — POCT I-STAT TROPONIN I: Troponin i, poc: 0.02 ng/mL (ref 0.00–0.08)

## 2013-02-11 LAB — PROTIME-INR
INR: 1.4 (ref 0.00–1.49)
Prothrombin Time: 16.8 seconds — ABNORMAL HIGH (ref 11.6–15.2)

## 2013-02-11 LAB — PRO B NATRIURETIC PEPTIDE: Pro B Natriuretic peptide (BNP): 6486 pg/mL — ABNORMAL HIGH (ref 0–125)

## 2013-02-11 MED ORDER — DILTIAZEM HCL ER COATED BEADS 120 MG PO CP24: 240.0000 mg | ORAL_CAPSULE | Freq: Every day | ORAL | Status: DC

## 2013-02-11 MED ORDER — DILTIAZEM LOAD VIA INFUSION
15.0000 mg | Freq: Once | INTRAVENOUS | Status: AC
  Administered 2013-02-11: 15 mg via INTRAVENOUS
  Filled 2013-02-11: qty 15

## 2013-02-11 MED ORDER — FUROSEMIDE 40 MG PO TABS: ORAL_TABLET | ORAL | Status: DC

## 2013-02-11 MED ORDER — SODIUM CHLORIDE 0.9 % IV SOLN
1000.0000 mL | INTRAVENOUS | Status: DC
  Administered 2013-02-11: 1000 mL via INTRAVENOUS

## 2013-02-11 MED ORDER — DILTIAZEM HCL 90 MG PO TABS
240.0000 mg | ORAL_TABLET | Freq: Once | ORAL | Status: DC
  Filled 2013-02-11: qty 1

## 2013-02-11 MED ORDER — ASPIRIN 81 MG PO CHEW
324.0000 mg | CHEWABLE_TABLET | Freq: Once | ORAL | Status: AC
  Administered 2013-02-11: 324 mg via ORAL
  Filled 2013-02-11: qty 4

## 2013-02-11 MED ORDER — DILTIAZEM HCL ER COATED BEADS 240 MG PO CP24
240.0000 mg | ORAL_CAPSULE | Freq: Once | ORAL | Status: AC
  Administered 2013-02-11: 240 mg via ORAL
  Filled 2013-02-11: qty 1

## 2013-02-11 MED ORDER — POTASSIUM CHLORIDE CRYS ER 20 MEQ PO TBCR
40.0000 meq | EXTENDED_RELEASE_TABLET | Freq: Once | ORAL | Status: AC
  Administered 2013-02-11: 40 meq via ORAL
  Filled 2013-02-11: qty 2

## 2013-02-11 MED ORDER — FUROSEMIDE 10 MG/ML IJ SOLN
80.0000 mg | Freq: Once | INTRAMUSCULAR | Status: AC
  Administered 2013-02-11: 80 mg via INTRAVENOUS
  Filled 2013-02-11: qty 8

## 2013-02-11 MED ORDER — DEXTROSE 5 % IV SOLN
5.0000 mg/h | INTRAVENOUS | Status: DC
  Administered 2013-02-11: 5 mg/h via INTRAVENOUS
  Filled 2013-02-11: qty 100

## 2013-02-11 NOTE — ED Provider Notes (Signed)
4:34 PM The patient was seen and evaluated by cardiology.  The recommendation was made for admission to the hospital.  At this time the patient does not want to be admitted to the hospital.  Both myself and cardiology does not think this is a good decision.  We both expressed this to the patient fully.  He understands the risks which include additional AICD shocks, syncope, MI, worsening congestive heart failure, death.  He understands the risk and understands he is welcome to return at any time.  He feels that the patient has all the information accessible to him.  I feel as though he comprehends information.  I do not believe the patient has altered her confused.  He does have the ability to make his own decisions.  He will be followed up in the cardiology clinic.  Cardiology is requested that he stop his Coumadin, start Cardizem CD 240 mg a day, increase Lasix to 80 mg of Lasix in the morning and 40 mg in the evening.  His AICD will be changed by the [facility] re[to deliver a shock at greater than 220 rate.  He'll followup in the  device clinic.  Please see consultation note for complete details  Lyanne Co, MD 02/11/13 [phone]

## 2013-02-11 NOTE — ED Notes (Signed)
Dr. Patria Mane spoke with pt about risks of leaving AMA. Cardiology MD at bedside also explaining risks. Pt verbalized understanding.

## 2013-02-11 NOTE — Consult Note (Signed)
Cardiology Consult Note   Patient ID: Jacob [name] MRN: [id number], DOB/AGE: [birth year] 57 y.o. Date of Encounter: 02/11/2013  Primary Physician: Alois Cliche, PA-C Primary Cardiologist: Nahser  Chief Complaint:  defib firing  HPI: Jacob [name] is a 57 y.o. male with a history of CAD, s/p CABG, ICM and a [employer] ICD. He was hospitalized in October 2014 for CHF, discharge weight 209 pounds. He was in no-show to his follow up appointment on 11/3.  Mr. Jacob [name] states his weight has been stable at 215 pounds, he is compliant with a low sodium diet and has been taking all of his medications. He denies lower extremity edema, orthopnea or PND. However, he states he began coughing 2 days ago and the cough has become worse. He has had no fevers and no purulent sputum. This a.m., he was coughing very hard and his defibrillator fired. He was concerned and came to the emergency room.  In the emergency room, he is in atrial fibrillation with rapid ventricular response and has acute on chronic systolic CHF. Interrogation of his defibrillator is pending.  He has received Lasix 80 mg IV x1, urine output greater than 600 cc since then. He is not currently coughing. His heart rate control has improved on IV Cardizem.  Mr. Jacob [name] never knows that he is in atrial fibrillation and is never aware of his heart rate. He denies presyncope or syncope. He has chronic dyspnea on exertion and does not exert himself much but does not believe his dyspnea on exertion has changed. Until his defibrillator fired today, he felt he was doing very well. He is extremely reluctant to stay in the hospital and does not believe admission is needed.  Past Medical History  Diagnosis Date  . Diabetes mellitus   . Hypertension   . Systolic heart failure     EF is 40-45% by echo, December 2013  . Noncompliance   . CAD (coronary artery disease) Sept 2013    s/p cardiac cath showing occlusion of small RCA with collaterals    . Chronic anticoagulation     on coumadin  . Atrial fibrillation     Surgical History:  Past Surgical History  Procedure Laterality Date  . S/p cardiac cath  Sept 2013  . Coronary artery bypass graft      2 vessels per patient [name]  July 2014  . Implantable cardioverter defibrillator implant      [place] in 07/2012; [employer]     I have reviewed the patient's current medications. Prior to Admission medications   Medication Sig Start Date End Date Taking? Authorizing Provider  aspirin EC 81 MG tablet Take 81 mg by mouth daily after lunch.    Yes Historical Provider, MD  carvedilol (COREG) 25 MG tablet Take 1 tablet (25 mg total) by mouth 2 (two) times daily with a meal. 01/12/13  Yes Rhonda G Barrett, PA-C  digoxin (LANOXIN) 0.125 MG tablet Take 0.125 mg by mouth daily after lunch. 01/13/12  Yes Rosalio Macadamia, NP  furosemide (LASIX) 40 MG tablet Take 1 tablet (40 mg total) by mouth 2 (two) times daily. 01/12/13  Yes Rhonda G Barrett, PA-C  glipiZIDE (GLUCOTROL) 5 MG tablet Take 5 mg by mouth daily after lunch.    Yes Historical Provider, MD  lisinopril (PRINIVIL,ZESTRIL) 5 MG tablet Take 1 tablet (5 mg total) by mouth daily. 01/12/13  Yes Rhonda G Barrett, PA-C  lovastatin (MEVACOR) 20 MG tablet Take 1 tablet (20 mg  total) by mouth at bedtime. 01/12/13  Yes Rhonda G Barrett, PA-C  nitroGLYCERIN (NITROSTAT) 0.4 MG SL tablet Place 1 tablet (0.4 mg total) under the tongue every 5 (five) minutes as needed for chest pain. 01/12/13  Yes Rhonda G Barrett, PA-C  potassium chloride (K-DUR,KLOR-CON) 10 MEQ tablet Take 1 tablet (10 mEq total) by mouth 2 (two) times daily. 01/12/13  Yes Rhonda G Barrett, PA-C   Scheduled Meds:  Continuous Infusions: . sodium chloride 1,000 mL (02/11/13 1205)  . diltiazem (CARDIZEM) infusion 15 mg/hr (02/11/13 1406)   Allergies: No Known Allergies  History   Social History  . Marital Status: Divorced    Spouse Name: N/A    Number of Children:  3  . Years of Education: N/A   Occupational History  . Unemployed    Social History Main Topics  . Smoking status: Current Every Day Smoker -- 0.50 packs/day for 20 years    Types: Cigarettes  . Smokeless tobacco: Never Used     Comment: Still with 3 - 4 per day.   . Alcohol Use: No  . Drug Use: No  . Sexual Activity: No   Other Topics Concern  . Not on file   Social History Narrative   Has an apartment with a roommate. He was living on the streets in 2013/01/27.  He reports that his father died in [place] in 01/27/2013.  He is divorced.  He is no longer estranged from his son, but still from his daughter who lives locally.  Neither of his parents, nor any siblings have any history of CAD.    Family History  Problem Relation Age of Onset  . Diabetes Mother    Family Status  Relation Status Death Age  . Mother Alive 60s    No CAD  . Father Deceased 55s    No CAD    Review of Systems:   Full 14-point review of systems otherwise negative except as noted above.  Physical Exam: Blood pressure 109/81, pulse 113, temperature 98.4 F (36.9 C), temperature source Oral, resp. rate 21, SpO2 97.00%. General: Well developed, well nourished,male in no acute distress. Head: Normocephalic, atraumatic, sclera non-icteric, no xanthomas, nares are without discharge. Dentition: Poor Neck: No carotid bruits. JVD elevated at 11 cm. No thyromegally Lungs: Good expansion bilaterally. without wheezes or rhonchi. Decreased breath sounds bases with rales Heart: IRRegular rate and rhythm with S1 S2.  No S3 or S4.  No murmur, no rubs, or gallops appreciated. Abdomen: Soft, non-tender, non-distended with normoactive bowel sounds. No hepatomegaly. No rebound/guarding. No obvious abdominal masses. Msk:  Strength and tone appear normal for age. No joint deformities or effusions, no spine or costo-vertebral angle tenderness. Extremities: No clubbing or cyanosis. No edema.  Distal pedal pulses are 2+ in  4 extrem Neuro: Alert and oriented X 3. Moves all extremities spontaneously. No focal deficits noted. Psych:  Responds to questions appropriately with a normal affect. Skin: No rashes or lesions noted  Labs:   Lab Results  Component Value Date   WBC 11.2* 02/11/2013   HGB 10.6* 02/11/2013   HCT 35.3* 02/11/2013   MCV 72.0* 02/11/2013   PLT 312 02/11/2013    Recent Labs  02/11/13 1136  INR 1.40     Recent Labs Lab 02/11/13 1136  NA 135  K 3.7  CL 99  CO2 24  BUN 15  CREATININE 0.87  CALCIUM 8.6  PROT 7.2  BILITOT 0.8  ALKPHOS 184*  ALT 15  AST 14  GLUCOSE 218*    Recent Labs  02/11/13 1203  TROPIPOC 0.02   Pro B Natriuretic peptide (BNP)  Date/Time Value Range Status  01/07/2013  4:09 PM 2389.0* 0 - 125 pg/mL Final  01/13/2012  3:28 PM 1267.0* 0.0 - 100.0 pg/mL Final    Radiology/Studies: Dg Chest Portable 1 View 02/11/2013   CLINICAL DATA:  Heart failure.  Atrial fibrillation.  EXAM: PORTABLE CHEST - 1 VIEW  COMPARISON:  01/10/2013.  FINDINGS: Mediastinum is normal. Prior CABG. Cardiac pacer. Cardiomegaly with pulmonary vascular prominence and interstitial infiltrates bilaterally are present. Alveolar infiltrates are also present. Blunting of the right costophrenic angle consistent with right pleural effusion present. Left hemidiaphragm is not imaged. No pneumothorax. No acute osseous abnormality.  IMPRESSION: 1.  Congestive heart failure with pulmonary edema.  2.  Prior CABG.  Cardiac pacer.   Electronically Signed   By: Maisie Fus  Register   On: 02/11/2013 12:33   Echo: 03/09/2012 - Left ventricle: The cavity size was mildly dilated. Wall thickness was increased in a pattern of moderate LVH. Systolic function was mildly to moderately reduced. The estimated ejection fraction was 45%, in the range of 40% to 45%. Diffuse hypokinesis. The study is not technically sufficient to allow evaluation of LV diastolic function. - Left atrium: The atrium was moderately  dilated.  ECG: 02/11/2013 Atrial fibrillation, rapid ventricular response Vent. rate 147 BPM PR interval * ms QRS duration 98 ms QT/QTc 320/500 ms P-R-T axes -1 -68 105  ASSESSMENT AND PLAN:  Mr. up to a 57 year old male with a history of cardiomyopathy, CHF, bypass surgery and a [employer] ICD. He comes in because his defibrillator fired. He is in rapid atrial fibrillation and is in heart failure as well. Currently, he is refusing admission.  Active Problems:   ICD (implantable cardioverter-defibrillator) discharge - interrogation is pending. Patient states he will wait for this.    Acute on chronic combined systolic and diastolic heart failure - has received IV Lasix x1 in the emergency room, will increase his daily Lasix to 80 mg a.m. and 40 mg p.m. for 3 days. We'll make appointment next week in the office for followup and labs. Potassium 3.7 prior to Lasix administration so we'll supplement.    DM (diabetes mellitus) - blood sugar is 218, hemoglobin A1c last month was 6.8, continue current medications    Warfarin anticoagulation - INR is subtherapeutic, patient has not been checking his Coumadin, but states he has been taking it.     Atrial fibrillation with rapid ventricular response - rate improved with IV Cardizem but this is not the best medication with his left ventricular dysfunction and heart failure. On Coreg 25 mg BID and dig 0.125 mg daily. Ck dig level. Consider changing to Metoprolol for better HR control, even though less effective on LVD.   If his ICD fired appropriately, we will again try to convince Mr. Duplantis of the need for admission. Otherwise, continue the above plan.   Melida Quitter, PA-C 02/11/2013 2:59 PM Beeper [phone]  I have personally seen and examined this patient with Theodore Demark, PA-C.  I agree with the assessment and plan as outlined above. Complex situation. Pt admitted after ICD shock today. Devic interrogation shows that he was  likely shocked for rapid ventricular rate with atrial fibrillation. He is in CHF with evidence of pulmonary edema. He has diuresed with IV Lasix today in the ED. Ventricular rate controlled with IV Cardizem. He has been taking coumadin  at home but we actually told him not to take it when discharged from our team in October due to his non-compliance (it was felt that he would not follow up in coumadin clinic). INR is 1.4 today.  We have suggested admission for diuresis and rate control tonight but he refuses to be admitted as he has plans tomorrow. He is leaving the ED against our advice. We have adjusted his ICD so that it will not shock a tachycardia until the rate is over 220. (VF zone raised from 200 to 220). Will increase Lasix to 80 mg po AM, 40 pm. Will start Cardizem CD 240 mg po Qdaily. This may not be the best agent long term with his LV dysfunction. He is asked to stop his coumadin for now as he is not supposed to be taking this (felt to be high risk with his medical non-compliance). We will arrange close f/u with in our office next week and plan device interrogation the day of his visit. He states that he will keep his f/u appt. He will come back immediately to the ED if he has increase in dyspnea, dizziness or further ICD discharges. He has many complex social issues and management long term will be very difficult. He will follow in the future with Dr. Elease Hashimoto.   MCALHANY,CHRISTOPHER 02/11/2013 4:41 PM

## 2013-02-11 NOTE — ED Notes (Addendum)
interrogation  tech at bedside.

## 2013-02-11 NOTE — ED Provider Notes (Signed)
CSN: [id number]     Arrival date & time 02/11/13  1119 History   First MD Initiated Contact with Patient 02/11/13 1120     Chief Complaint  Patient presents with  . AICD Problem  . Atrial Fibrillation    HPI The patient presents emergency day after his AICD fired. Patient states he was in his usual state of health without any particular complaints. He was not having any trouble with chest pain or shortness of breath. The defibrillator fired once. This started the patient. He called 911 and presented to the emergency room for evaluation. He did have an episode of chest pain after the initial shock. He took one sublingual nitroglycerin. Patient was not having any trouble with chest pain, shortness of breath or lightheadedness. He did not feel like he was going to pass out. He denies any trouble with vomiting diarrhea. Past Medical History  Diagnosis Date  . Diabetes mellitus   . Hypertension   . Systolic heart failure     EF is 25 to 30%  . Noncompliance   . CAD (coronary artery disease) Sept 2013    s/p cardiac cath showing occlusion of small RCA with collaterals  . Chronic anticoagulation     on coumadin  . Atrial fibrillation    Past Surgical History  Procedure Laterality Date  . S/p cardiac cath  Sept 2013  . Coronary artery bypass graft      2 vessels per patient [name]  July 2014  . Implantable cardioverter defibrillator implant      [place] in 07/2012   Family History  Problem Relation Age of Onset  . Diabetes Mother    History  Substance Use Topics  . Smoking status: Current Every Day Smoker -- 0.50 packs/day for 20 years    Types: Cigarettes  . Smokeless tobacco: Never Used     Comment: Still with 3 - 4 per day.   . Alcohol Use: No    Review of Systems  All other systems reviewed and are negative.    Allergies  Review of patient's allergies indicates no known allergies.  Home Medications   Current Outpatient Rx  Name  Route  Sig  Dispense  Refill  .  aspirin EC 81 MG tablet   Oral   Take 81 mg by mouth daily after lunch.          . carvedilol (COREG) 25 MG tablet   Oral   Take 1 tablet (25 mg total) by mouth 2 (two) times daily with a meal.   60 tablet   11   . digoxin (LANOXIN) 0.125 MG tablet   Oral   Take 0.125 mg by mouth daily after lunch.         . furosemide (LASIX) 40 MG tablet   Oral   Take 1 tablet (40 mg total) by mouth 2 (two) times daily.   60 tablet   11   . glipiZIDE (GLUCOTROL) 5 MG tablet   Oral   Take 5 mg by mouth daily after lunch.          . lisinopril (PRINIVIL,ZESTRIL) 5 MG tablet   Oral   Take 1 tablet (5 mg total) by mouth daily.   30 tablet   11   . lovastatin (MEVACOR) 20 MG tablet   Oral   Take 1 tablet (20 mg total) by mouth at bedtime.   30 tablet   11   . nitroGLYCERIN (NITROSTAT) 0.4 MG SL tablet   Sublingual  Place 1 tablet (0.4 mg total) under the tongue every 5 (five) minutes as needed for chest pain.   25 tablet   3   . potassium chloride (K-DUR,KLOR-CON) 10 MEQ tablet   Oral   Take 1 tablet (10 mEq total) by mouth 2 (two) times daily.   60 tablet   11    BP 118/85  Pulse 145  Temp(Src) 98.4 F (36.9 C) (Oral)  Resp 23  SpO2 93% Physical Exam  Nursing note and vitals reviewed. Constitutional: He appears well-developed and well-nourished. No distress.  HENT:  Head: Normocephalic and atraumatic.  Right Ear: External ear normal.  Left Ear: External ear normal.  Eyes: Conjunctivae are normal. Right eye exhibits no discharge. Left eye exhibits no discharge. No scleral icterus.  Neck: Neck supple. No tracheal deviation present.  Cardiovascular: Intact distal pulses.  An irregularly irregular rhythm present. Tachycardia present.   Pulmonary/Chest: Effort normal and breath sounds normal. No stridor. No respiratory distress. He has no wheezes. He has no rales.  aicd scar left chest wall, no erythema or ttp  Abdominal: Soft. Bowel sounds are normal. He exhibits no  distension. There is no tenderness. There is no rebound and no guarding.  Musculoskeletal: He exhibits no edema and no tenderness.  Neurological: He is alert. He has normal strength. No sensory deficit. Cranial nerve deficit:  no gross defecits noted. He exhibits normal muscle tone. He displays no seizure activity. Coordination normal.  Skin: Skin is warm and dry. No rash noted.  Psychiatric: He has a normal mood and affect.    ED Course  Procedures (including critical care time) Labs Review Labs Reviewed  CBC - Abnormal; Notable for the following:    WBC 11.2 (*)    Hemoglobin 10.6 (*)    HCT 35.3 (*)    MCV 72.0 (*)    MCH 21.6 (*)    RDW 19.9 (*)    All other components within normal limits  PROTIME-INR - Abnormal; Notable for the following:    Prothrombin Time 16.8 (*)    All other components within normal limits  APTT  COMPREHENSIVE METABOLIC PANEL  POCT I-STAT TROPONIN I   Imaging Review Dg Chest Portable 1 View  02/11/2013   CLINICAL DATA:  Heart failure.  Atrial fibrillation.  EXAM: PORTABLE CHEST - 1 VIEW  COMPARISON:  01/10/2013.  FINDINGS: Mediastinum is normal. Prior CABG. Cardiac pacer. Cardiomegaly with pulmonary vascular prominence and interstitial infiltrates bilaterally are present. Alveolar infiltrates are also present. Blunting of the right costophrenic angle consistent with right pleural effusion present. Left hemidiaphragm is not imaged. No pneumothorax. No acute osseous abnormality.  IMPRESSION: 1.  Congestive heart failure with pulmonary edema.  2.  Prior CABG.  Cardiac pacer.   Electronically Signed   By: Maisie Fus  Register   On: 02/11/2013 12:33    EKG Interpretation    Date/Time:  Thursday February 11 2013 11:20:53 EST Ventricular Rate:  147 PR Interval:    QRS Duration: 98 QT Interval:  320 QTC Calculation: 500 R Axis:   -68 Text Interpretation:  Atrial fibrillation Ventricular bigeminy Left anterior fascicular block Probable anteroseptal infarct, old  Borderline repolarization abnormality Prolonged QT interval Since last tracing rate faster Confirmed by Kalyse Meharg  MD-J, Shalaine Payson ([id number]) on 02/11/2013 11:37:57 AM           Medications  0.9 %  sodium chloride infusion (1,000 mLs Intravenous New Bag/Given 02/11/13 1205)  diltiazem (CARDIZEM) 1 mg/mL load via infusion 15 mg (0 mg Intravenous  Stopped 02/11/13 1244)    And  diltiazem (CARDIZEM) 100 mg in dextrose 5 % 100 mL infusion (5 mg/hr Intravenous New Bag/Given 02/11/13 1240)  furosemide (LASIX) injection 80 mg (not administered)  aspirin chewable tablet 324 mg (324 mg Oral Given 02/11/13 1202)   1255 Cardizem drip was ordered.  Patient remained stable in no distress.  HR now 113. CRITICAL CARE Performed by: Celene Kras Total critical care time: 35 Critical care time was exclusive of separately billable procedures and treating other patients. Critical care was necessary to treat or prevent imminent or life-threatening deterioration. Critical care was time spent personally by me on the following activities: development of treatment plan with patient and/or surrogate as well as nursing, discussions with consultants, evaluation of patient's response to treatment, examination of patient, obtaining history from patient or surrogate, ordering and performing treatments and interventions, ordering and review of laboratory studies, ordering and review of radiographic studies, pulse oximetry and re-evaluation of patient's condition.  MDM   1. CHF (congestive heart failure)   2. Atrial fibrillation with rapid ventricular response    Patient presented to the emergency room with recurrent A. fib with RVR. Chest x-ray also suggests acute pulmonary edema most likely related to his uncontrolled A. fib. Patient has history of medical noncompliance but states he has been taking his medications. Patient also notices that his AICD firing. I will consult with cardiology regarding admission for further treatment.  He may benefit from interrogation of his AICD    Celene Kras, MD 02/11/13 1258

## 2013-02-11 NOTE — ED Notes (Signed)
Cardiology paged for update on when interrogation tech is coming.

## 2013-02-11 NOTE — ED Notes (Signed)
Cardiology at bedside.

## 2013-02-11 NOTE — ED Notes (Signed)
Cardiology MD at bedside.

## 2013-02-11 NOTE — ED Notes (Signed)
Per EMS pt defibrillator fired today. Pt denies symptoms before event. Pt took SL nitro after due to CP. Pt denies other symptoms. afib on monitor with RVR.

## 2013-02-15 ENCOUNTER — Encounter: Payer: Medicaid - Out of State | Admitting: Nurse Practitioner

## 2013-02-16 ENCOUNTER — Encounter: Payer: Self-pay | Admitting: Nurse Practitioner

## 2013-03-19 ENCOUNTER — Encounter: Payer: Self-pay | Admitting: Nurse Practitioner

## 2013-04-03 ENCOUNTER — Encounter (HOSPITAL_COMMUNITY): Payer: Self-pay | Admitting: Emergency Medicine

## 2013-04-03 DIAGNOSIS — Z951 Presence of aortocoronary bypass graft: Secondary | ICD-10-CM

## 2013-04-03 DIAGNOSIS — J189 Pneumonia, unspecified organism: Secondary | ICD-10-CM | POA: Diagnosis present

## 2013-04-03 DIAGNOSIS — I4891 Unspecified atrial fibrillation: Secondary | ICD-10-CM | POA: Diagnosis present

## 2013-04-03 DIAGNOSIS — E119 Type 2 diabetes mellitus without complications: Secondary | ICD-10-CM | POA: Diagnosis present

## 2013-04-03 DIAGNOSIS — I10 Essential (primary) hypertension: Secondary | ICD-10-CM | POA: Diagnosis present

## 2013-04-03 DIAGNOSIS — Z91199 Patient's noncompliance with other medical treatment and regimen due to unspecified reason: Secondary | ICD-10-CM

## 2013-04-03 DIAGNOSIS — I2510 Atherosclerotic heart disease of native coronary artery without angina pectoris: Secondary | ICD-10-CM | POA: Diagnosis present

## 2013-04-03 DIAGNOSIS — Z5900 Homelessness unspecified: Secondary | ICD-10-CM

## 2013-04-03 DIAGNOSIS — Z95810 Presence of automatic (implantable) cardiac defibrillator: Secondary | ICD-10-CM

## 2013-04-03 DIAGNOSIS — F17200 Nicotine dependence, unspecified, uncomplicated: Secondary | ICD-10-CM | POA: Diagnosis present

## 2013-04-03 DIAGNOSIS — I5043 Acute on chronic combined systolic (congestive) and diastolic (congestive) heart failure: Principal | ICD-10-CM | POA: Diagnosis present

## 2013-04-03 DIAGNOSIS — Z79899 Other long term (current) drug therapy: Secondary | ICD-10-CM

## 2013-04-03 DIAGNOSIS — I2589 Other forms of chronic ischemic heart disease: Secondary | ICD-10-CM | POA: Diagnosis present

## 2013-04-03 DIAGNOSIS — I509 Heart failure, unspecified: Secondary | ICD-10-CM | POA: Diagnosis present

## 2013-04-03 DIAGNOSIS — Z9119 Patient's noncompliance with other medical treatment and regimen: Secondary | ICD-10-CM

## 2013-04-03 NOTE — ED Notes (Signed)
Pt. reports productive cough , nasal congestion , chest congestion , SOB  , chills and body aches and fatigue onset today .

## 2013-04-04 ENCOUNTER — Inpatient Hospital Stay (HOSPITAL_COMMUNITY)
Admission: EM | Admit: 2013-04-04 | Discharge: 2013-04-07 | DRG: 291 | Disposition: A | Discharge status: Home / Self Care | Payer: Medicaid - Out of State | Attending: Internal Medicine | Admitting: Internal Medicine

## 2013-04-04 ENCOUNTER — Encounter (HOSPITAL_COMMUNITY): Payer: Self-pay | Admitting: Internal Medicine

## 2013-04-04 ENCOUNTER — Ambulatory Visit (HOSPITAL_COMMUNITY): Payer: Medicaid - Out of State | Attending: Emergency Medicine

## 2013-04-04 DIAGNOSIS — I509 Heart failure, unspecified: Secondary | ICD-10-CM | POA: Insufficient documentation

## 2013-04-04 DIAGNOSIS — E119 Type 2 diabetes mellitus without complications: Secondary | ICD-10-CM | POA: Diagnosis present

## 2013-04-04 DIAGNOSIS — Z95810 Presence of automatic (implantable) cardiac defibrillator: Secondary | ICD-10-CM

## 2013-04-04 DIAGNOSIS — Z5900 Homelessness unspecified: Secondary | ICD-10-CM

## 2013-04-04 DIAGNOSIS — R0789 Other chest pain: Secondary | ICD-10-CM

## 2013-04-04 DIAGNOSIS — I10 Essential (primary) hypertension: Secondary | ICD-10-CM | POA: Diagnosis present

## 2013-04-04 DIAGNOSIS — I5022 Chronic systolic (congestive) heart failure: Secondary | ICD-10-CM

## 2013-04-04 DIAGNOSIS — I5033 Acute on chronic diastolic (congestive) heart failure: Secondary | ICD-10-CM

## 2013-04-04 DIAGNOSIS — I5043 Acute on chronic combined systolic (congestive) and diastolic (congestive) heart failure: Principal | ICD-10-CM

## 2013-04-04 DIAGNOSIS — I482 Chronic atrial fibrillation, unspecified: Secondary | ICD-10-CM | POA: Diagnosis present

## 2013-04-04 DIAGNOSIS — I2510 Atherosclerotic heart disease of native coronary artery without angina pectoris: Secondary | ICD-10-CM | POA: Diagnosis present

## 2013-04-04 DIAGNOSIS — Z91199 Patient's noncompliance with other medical treatment and regimen due to unspecified reason: Secondary | ICD-10-CM

## 2013-04-04 DIAGNOSIS — I4891 Unspecified atrial fibrillation: Secondary | ICD-10-CM | POA: Diagnosis present

## 2013-04-04 DIAGNOSIS — D509 Iron deficiency anemia, unspecified: Secondary | ICD-10-CM

## 2013-04-04 DIAGNOSIS — Z9119 Patient's noncompliance with other medical treatment and regimen: Secondary | ICD-10-CM

## 2013-04-04 DIAGNOSIS — I5042 Chronic combined systolic (congestive) and diastolic (congestive) heart failure: Secondary | ICD-10-CM | POA: Diagnosis present

## 2013-04-04 DIAGNOSIS — J189 Pneumonia, unspecified organism: Secondary | ICD-10-CM

## 2013-04-04 LAB — CBC WITH DIFFERENTIAL/PLATELET
BASOS PCT: 0 % (ref 0–1)
Basophils Absolute: 0 10*3/uL (ref 0.0–0.1)
Eosinophils Absolute: 0.1 10*3/uL (ref 0.0–0.7)
Eosinophils Relative: 1 % (ref 0–5)
HEMATOCRIT: 37.8 % — AB (ref 39.0–52.0)
HEMOGLOBIN: 11 g/dL — AB (ref 13.0–17.0)
LYMPHS PCT: 12 % (ref 12–46)
Lymphs Abs: 1.3 10*3/uL (ref 0.7–4.0)
MCH: 19.8 pg — ABNORMAL LOW (ref 26.0–34.0)
MCHC: 29.1 g/dL — ABNORMAL LOW (ref 30.0–36.0)
MCV: 68 fL — ABNORMAL LOW (ref 78.0–100.0)
Monocytes Absolute: 1 10*3/uL (ref 0.1–1.0)
Monocytes Relative: 9 % (ref 3–12)
NEUTROS ABS: 8.8 10*3/uL — AB (ref 1.7–7.7)
Neutrophils Relative %: 78 % — ABNORMAL HIGH (ref 43–77)
Platelets: 335 10*3/uL (ref 150–400)
RBC: 5.56 MIL/uL (ref 4.22–5.81)
RDW: 20.4 % — ABNORMAL HIGH (ref 11.5–15.5)
WBC: 11.2 10*3/uL — AB (ref 4.0–10.5)

## 2013-04-04 LAB — CBC
HCT: 37.9 % — ABNORMAL LOW (ref 39.0–52.0)
HEMOGLOBIN: 10.9 g/dL — AB (ref 13.0–17.0)
MCH: 19.5 pg — ABNORMAL LOW (ref 26.0–34.0)
MCHC: 28.8 g/dL — AB (ref 30.0–36.0)
MCV: 67.9 fL — ABNORMAL LOW (ref 78.0–100.0)
PLATELETS: 297 10*3/uL (ref 150–400)
RBC: 5.58 MIL/uL (ref 4.22–5.81)
RDW: 19.9 % — ABNORMAL HIGH (ref 11.5–15.5)
WBC: 11 10*3/uL — ABNORMAL HIGH (ref 4.0–10.5)

## 2013-04-04 LAB — BASIC METABOLIC PANEL
BUN: 28 mg/dL — AB (ref 6–23)
CO2: 28 mEq/L (ref 19–32)
Calcium: 8.2 mg/dL — ABNORMAL LOW (ref 8.4–10.5)
Chloride: 95 mEq/L — ABNORMAL LOW (ref 96–112)
Creatinine, Ser: 1.17 mg/dL (ref 0.50–1.35)
GFR calc Af Amer: 79 mL/min — ABNORMAL LOW (ref >90)
GFR calc non Af Amer: 68 mL/min — ABNORMAL LOW (ref >90)
GLUCOSE: 194 mg/dL — AB (ref 70–99)
POTASSIUM: 4.8 meq/L (ref 3.7–5.3)
Sodium: 134 mEq/L — ABNORMAL LOW (ref 137–147)

## 2013-04-04 LAB — CREATININE, SERUM
CREATININE: 1.11 mg/dL (ref 0.50–1.35)
GFR calc Af Amer: 84 mL/min — ABNORMAL LOW (ref >90)
GFR, EST NON AFRICAN AMERICAN: 72 mL/min — AB (ref >90)

## 2013-04-04 LAB — TSH: TSH: 3.981 u[IU]/mL (ref 0.350–4.500)

## 2013-04-04 LAB — TROPONIN I: Troponin I: <0.3 ng/mL (ref <0.30)

## 2013-04-04 LAB — PRO B NATRIURETIC PEPTIDE: Pro B Natriuretic peptide (BNP): 6119 pg/mL — ABNORMAL HIGH (ref 0–125)

## 2013-04-04 LAB — PROTIME-INR
INR: 1.54 — AB (ref 0.00–1.49)
Prothrombin Time: 18.1 seconds — ABNORMAL HIGH (ref 11.6–15.2)

## 2013-04-04 MED ORDER — FUROSEMIDE 10 MG/ML IJ SOLN
40.0000 mg | Freq: Once | INTRAMUSCULAR | Status: AC
  Administered 2013-04-04: 40 mg via INTRAVENOUS
  Filled 2013-04-04: qty 4

## 2013-04-04 MED ORDER — ALBUTEROL SULFATE (2.5 MG/3ML) 0.083% IN NEBU: 2.5000 mg | INHALATION_SOLUTION | RESPIRATORY_TRACT | Status: DC | PRN

## 2013-04-04 MED ORDER — ALBUTEROL SULFATE (2.5 MG/3ML) 0.083% IN NEBU: 2.5000 mg | INHALATION_SOLUTION | Freq: Four times a day (QID) | RESPIRATORY_TRACT | Status: DC

## 2013-04-04 MED ORDER — SODIUM CHLORIDE 0.9 % IJ SOLN
3.0000 mL | Freq: Two times a day (BID) | INTRAMUSCULAR | Status: DC
  Administered 2013-04-04 – 2013-04-06 (×5): 3 mL via INTRAVENOUS

## 2013-04-04 MED ORDER — ALBUTEROL SULFATE (2.5 MG/3ML) 0.083% IN NEBU
2.5000 mg | INHALATION_SOLUTION | Freq: Three times a day (TID) | RESPIRATORY_TRACT | Status: DC
  Administered 2013-04-04 – 2013-04-06 (×8): 2.5 mg via RESPIRATORY_TRACT
  Filled 2013-04-04 (×8): qty 3

## 2013-04-04 MED ORDER — HEPARIN SODIUM (PORCINE) 5000 UNIT/ML IJ SOLN
5000.0000 [IU] | Freq: Three times a day (TID) | INTRAMUSCULAR | Status: DC
  Administered 2013-04-04 – 2013-04-05 (×4): 5000 [IU] via SUBCUTANEOUS
  Filled 2013-04-04 (×10): qty 1

## 2013-04-04 MED ORDER — FUROSEMIDE 10 MG/ML IJ SOLN
40.0000 mg | Freq: Once | INTRAMUSCULAR | Status: AC
  Administered 2013-04-04: 40 mg via INTRAVENOUS

## 2013-04-04 MED ORDER — FUROSEMIDE 10 MG/ML IJ SOLN
40.0000 mg | Freq: Every day | INTRAMUSCULAR | Status: DC
  Administered 2013-04-04 – 2013-04-05 (×2): 40 mg via INTRAVENOUS
  Filled 2013-04-04 (×4): qty 4

## 2013-04-04 NOTE — ED Provider Notes (Signed)
CSN: [id number]     Arrival date & time 04/03/13  2346 History   First MD Initiated Contact with Patient 04/04/13 0246     Chief Complaint  Patient presents with  . Cough   (Consider location/radiation/quality/duration/timing/severity/associated sxs/prior Treatment) HPI 58 year old male presents emergency department from home with complaint of shortness of breath, cough, mild chest pain.  Patient, reports he's been out of his medications the last 10 days.  He reports he moved from one location to another and left his medications behind.  Patient has history of coronary disease, status post CABG, hypertension, A. fib. Past Medical History  Diagnosis Date  . Diabetes mellitus   . Hypertension   . Systolic heart failure     EF is 40-45% by echo, December 2013  . Noncompliance   . CAD (coronary artery disease) Sept 2013    s/p cardiac cath showing occlusion of small RCA with collaterals  . Chronic anticoagulation     on coumadin  . Atrial fibrillation    Past Surgical History  Procedure Laterality Date  . S/p cardiac cath  Sept 2013  . Coronary artery bypass graft      2 vessels per patient [name]  July 2014  . Implantable cardioverter defibrillator implant      [place] in 07/2012; [employer]   Family History  Problem Relation Age of Onset  . Diabetes Mother    History  Substance Use Topics  . Smoking status: Current Every Day Smoker -- 0.50 packs/day for 20 years    Types: Cigarettes  . Smokeless tobacco: Never Used     Comment: Still with 3 - 4 per day.   . Alcohol Use: No    Review of Systems  See History of Present Illness; otherwise all other systems are reviewed and negative Allergies  Review of patient's allergies indicates no known allergies.  Home Medications  No current outpatient prescriptions on file. BP 125/87  Pulse 102  Temp(Src) 97.3 F (36.3 C) (Oral)  Resp 23  Ht 5\' 7"  (1.702 m)  Wt 204 lb (92.534 kg)  BMI 31.94 kg/m2  SpO2  99% Physical Exam  Nursing note and vitals reviewed. Constitutional: He is oriented to person, place, and time. He appears well-developed and well-nourished. He appears distressed (uncomfortable appearing).  HENT:  Head: Normocephalic and atraumatic.  Nose: Nose normal.  Mouth/Throat: Oropharynx is clear and moist.  Eyes: Conjunctivae and EOM are normal. Pupils are equal, round, and reactive to light.  Neck: Normal range of motion. Neck supple. No JVD present. No tracheal deviation present. No thyromegaly present.  Cardiovascular: Normal rate, regular rhythm, normal heart sounds and intact distal pulses.  Exam reveals no gallop and no friction rub.   No murmur heard. Pulmonary/Chest: Effort normal. No stridor. No respiratory distress. He has wheezes. He has rales. He exhibits no tenderness.  Abdominal: Soft. Bowel sounds are normal. He exhibits no distension and no mass. There is no tenderness. There is no rebound and no guarding.  Musculoskeletal: Normal range of motion. He exhibits no edema and no tenderness.  Lymphadenopathy:    He has no cervical adenopathy.  Neurological: He is alert and oriented to person, place, and time. He exhibits normal muscle tone. Coordination normal.  Skin: Skin is warm and dry. No rash noted. No erythema. No pallor.  Psychiatric: He has a normal mood and affect. His behavior is normal. Judgment and thought content normal.    ED Course  Procedures (including critical care time) Labs Review  Labs Reviewed  CBC WITH DIFFERENTIAL - Abnormal; Notable for the following:    WBC 11.2 (*)    Hemoglobin 11.0 (*)    HCT 37.8 (*)    MCV 68.0 (*)    MCH 19.8 (*)    MCHC 29.1 (*)    RDW 20.4 (*)    Neutrophils Relative % 78 (*)    Neutro Abs 8.8 (*)    All other components within normal limits  BASIC METABOLIC PANEL - Abnormal; Notable for the following:    Sodium 134 (*)    Chloride 95 (*)    Glucose, Bld 194 (*)    BUN 28 (*)    Calcium 8.2 (*)    GFR  calc non Af Amer 68 (*)    GFR calc Af Amer 79 (*)    All other components within normal limits  PRO B NATRIURETIC PEPTIDE - Abnormal; Notable for the following:    Pro B Natriuretic peptide (BNP) 6119.0 (*)    All other components within normal limits  PROTIME-INR - Abnormal; Notable for the following:    Prothrombin Time 18.1 (*)    INR 1.54 (*)    All other components within normal limits  TROPONIN I   Imaging Review Dg Chest 2 View  04/04/2013   CLINICAL DATA:  Cough, fever.  EXAM: CHEST  2 VIEW  COMPARISON:  February 11, 2013.  FINDINGS: Stable cardiomediastinal silhouette. Status post coronary artery bypass graft. Left-sided pacemaker is unchanged in position. Stable mild central pulmonary vascular congestion is noted. Mild right pleural effusion is noted which is improved compared to prior exam. Right basilar opacity is improved compared to prior exam, although residual interstitial density remains concerning for edema or subsegmental atelectasis.  IMPRESSION: Right basilar opacity is improved compared to prior exam. Mild right pleural effusion is noted.   Electronically Signed   By: Roque LiasJames  Green M.D.   On: 04/04/2013 01:08    EKG Interpretation    Date/Time:  Sunday April 04 2013 02:57:43 EST Ventricular Rate:  103 PR Interval:    QRS Duration: 108 QT Interval:  401 QTC Calculation: 525 R Axis:   -102 Text Interpretation:  Atrial fibrillation Left anterior fascicular block Borderline low voltage, extremity leads Nonspecific T abnormalities, lateral leads Prolonged QT interval Since last tracing rate slower new lateral t wave inversions Confirmed by Klea Nall  MD, Ketra Duchesne ([id number]) on 04/04/2013 7:04:48 AM             MDM   1. CHF (congestive heart failure)   2. Noncompliance   3. Acute on chronic diastolic CHF (congestive heart failure), NYHA class 1   4. Atrial fibrillation with rapid ventricular response    58 year old male with acute on chronic congestive heart failure.   Patient is noncompliant with medications, does not have access to his medications.  Plan for admission to the hospital with diuresis.    Olivia Mackielga M Drequan Ironside, MD 04/04/13 [phone]

## 2013-04-04 NOTE — Progress Notes (Signed)
Received pt. From ED.Pt. Is from home with friends.Pt. Does not uses any equipment at home,skin is intact.Pt. Was place on telemetry.Keep monitoring pt. Closely and assessing his needs.

## 2013-04-04 NOTE — ED Notes (Signed)
Patient requests water to drink.

## 2013-04-04 NOTE — H&P (Signed)
[facility] History and Physical  Jacob [name] FFM:[id number] DOB: 21-Aug-1955    PCP:   No PCP  Chief Complaint: shortness of breath.  HPI: Jacob [name] is an 58 y.o. male with hx of chronic afib, previously on coumadin, but was discontinued because he has been noncompliance, CAD s/p CABG 2014, s/p IVCD, HTN, DM, presents to the ER with nonproductive coughs, wheezing, and shortness of breath.   He denied fever or chills.  Evalaution in the ER included a CXR which showed improved right basilar density, though concerns were raised for interstitial edema, elevated BNP to 6K, with normal Cr and Hb was 11 grams per dL.  He was given Lasix IV, and hospitalist was asked to admit him for CHF.  Rewiew of Systems:  Constitutional: Negative for malaise, fever and chills. No significant weight loss or weight gain Eyes: Negative for eye pain, redness and discharge, diplopia, visual changes, or flashes of light. ENMT: Negative for ear pain, hoarseness, nasal congestion, sinus pressure and sore throat. No headaches; tinnitus, drooling, or problem swallowing. Cardiovascular: Negative for palpitations, diaphoresis, and peripheral edema. ; No  PND Respiratory: Negative for cough, hemoptysis, wheezing and stridor. No pleuritic chestpain. Gastrointestinal: Negative for nausea, vomiting, diarrhea, constipation, abdominal pain, melena, blood in stool, hematemesis, jaundice and rectal bleeding.    Genitourinary: Negative for frequency, dysuria, incontinence,flank pain and hematuria; Musculoskeletal: Negative for back pain and neck pain. Negative for swelling and trauma.;  Skin: . Negative for pruritus, rash, abrasions, bruising and skin lesion.; ulcerations Neuro: Negative for headache, lightheadedness and neck stiffness. Negative for weakness, altered level of consciousness , altered mental status, extremity weakness, burning feet, involuntary movement, seizure and syncope.  Psych: negative for anxiety,  depression, insomnia, tearfulness, panic attacks, hallucinations, paranoia, suicidal or homicidal ideation.   Past Medical History  Diagnosis Date  . Diabetes mellitus   . Hypertension   . Systolic heart failure     EF is 40-45% by echo, December 2013  . Noncompliance   . CAD (coronary artery disease) Sept 2013    s/p cardiac cath showing occlusion of small RCA with collaterals  . Chronic anticoagulation     on coumadin  . Atrial fibrillation     Past Surgical History  Procedure Laterality Date  . S/p cardiac cath  Sept 2013  . Coronary artery bypass graft      2 vessels per patient [name]  July 2014  . Implantable cardioverter defibrillator implant      [place] in 07/2012; [employer]    Medications:  HOME MEDS: Prior to Admission medications   Not on File     Allergies:  No Known Allergies  Social History:   reports that he has been smoking Cigarettes.  He has a 10 pack-year smoking history. He has never used smokeless tobacco. He reports that he does not drink alcohol or use illicit drugs.  Family History: Family History  Problem Relation Age of Onset  . Diabetes Mother      Physical Exam: Filed Vitals:   04/03/13 2357 04/04/13 0230 04/04/13 0354 04/04/13 0400  BP: 111/88 90/72 133/90 125/87  Pulse: 87 100 97 102  Temp: 97.3 F (36.3 C)     TempSrc: Oral     Resp: 14  18 23   Height: 5\' 7"  (1.702 m)     Weight: 92.534 kg (204 lb)     SpO2: 98% 100% 100% 99%   Blood pressure 125/87, pulse 102, temperature 97.3 F (36.3 C), temperature source Oral,  resp. rate 23, height 5\' 7"  (1.702 m), weight 92.534 kg (204 lb), SpO2 99.00%.  GEN:  Pleasant patient lying in the stretcher in no acute distress; cooperative with exam. PSYCH:  alert and oriented x4; does not appear anxious or depressed; affect is appropriate. HEENT: Mucous membranes pink and anicteric; PERRLA; EOM intact; no cervical lymphadenopathy nor thyromegaly or carotid bruit; no JVD; There  were no stridor. Neck is very supple. Breasts:: Not examined CHEST WALL: No tenderness.  IVCD on left upper chest. CHEST: Normal respiration, with wheezing bilaterally, bisilar rales bilaterally. HEART: Regular rate and rhythm.  There are no murmur, rub, or gallops.   BACK: No kyphosis or scoliosis; no CVA tenderness ABDOMEN: soft and non-tender; no masses, no organomegaly, normal abdominal bowel sounds; no pannus; no intertriginous candida. There is no rebound and no distention. Rectal Exam: Not done EXTREMITIES: No bone or joint deformity; age-appropriate arthropathy of the hands and knees; 2+ edema; no ulcerations.  There is no calf tenderness. Genitalia: not examined PULSES: 2+ and symmetric SKIN: Normal hydration no rash or ulceration CNS: Cranial nerves 2-12 grossly intact no focal lateralizing neurologic deficit.  Speech is fluent; uvula elevated with phonation, facial symmetry and tongue midline. DTR are normal bilaterally, cerebella exam is intact, barbinski is negative and strengths are equaled bilaterally.  No sensory loss.   Labs on Admission:  Basic Metabolic Panel:  Recent Labs Lab 04/04/13 0319  NA 134*  K 4.8  CL 95*  CO2 28  GLUCOSE 194*  BUN 28*  CREATININE 1.17  CALCIUM 8.2*   Liver Function Tests: No results found for this basename: AST, ALT, ALKPHOS, BILITOT, PROT, ALBUMIN,  in the last 168 hours No results found for this basename: LIPASE, AMYLASE,  in the last 168 hours No results found for this basename: AMMONIA,  in the last 168 hours CBC:  Recent Labs Lab 04/04/13 0319  WBC 11.2*  NEUTROABS 8.8*  HGB 11.0*  HCT 37.8*  MCV 68.0*  PLT 335   Cardiac Enzymes:  Recent Labs Lab 04/04/13 0320  TROPONINI <0.30    CBG: No results found for this basename: GLUCAP,  in the last 168 hours   Radiological Exams on Admission: Dg Chest 2 View  04/04/2013   CLINICAL DATA:  Cough, fever.  EXAM: CHEST  2 VIEW  COMPARISON:  February 11, 2013.  FINDINGS:  Stable cardiomediastinal silhouette. Status post coronary artery bypass graft. Left-sided pacemaker is unchanged in position. Stable mild central pulmonary vascular congestion is noted. Mild right pleural effusion is noted which is improved compared to prior exam. Right basilar opacity is improved compared to prior exam, although residual interstitial density remains concerning for edema or subsegmental atelectasis.  IMPRESSION: Right basilar opacity is improved compared to prior exam. Mild right pleural effusion is noted.   Electronically Signed   By: Roque Lias M.D.   On: 04/04/2013 01:08    EKG: Independently reviewed. Afib with controlled rate, no acute ST-T changes.   Assessment/Plan Present on Admission:  . Acute on chronic combined systolic and diastolic heart failure . Musculoskeletal chest pain . Chronic atrial fibrillation . DM (diabetes mellitus) . Acute on chronic diastolic CHF (congestive heart failure), NYHA class 1  PLAN:  Will admit him for acute on chronic combined CHF.  He has not been compliant with his meds and was counselled about the importance of taking medication.  For his afib, he was taken off Coumadin, as he was not being compliance in having his INR checked.  He also have wheezing, which i think may be "cardiac wheezes".  Will admit him to telemetry, continue gentle IV diured, continue his meds, and cycle his troponin.  I will give neb treatment also, but he doesn't need steroid at this time.  For his afib, I have not restarted his anticoagulation, but perhaps consideration could be given to NOC (like Xarelto).  For his DM, will place him on SSI and carb modified diet.  He is stable, full code, and will be admitted to [facility] service.  [facility] in [facility] care.    Other plans as per orders.  Code Status: FULL Unk LightningODE.   Reef Achterberg, MD. [facility] Pager [phone] 7pm to 7am.  04/04/2013, 6:09 AM

## 2013-04-04 NOTE — Progress Notes (Signed)
Patient database reviewed. Briefly seen and examined. Admitted earlier today for SOB and found to be in acute presumed diastolic CHF. He has a h/o medication non-compliance. Continue lasix and strive for negative fluid balance. Has a h/o a fib but anticoagulation was discontinued as he refused to come in for INR checks. Will continue to follow.  Peggye Pitt, MD [facility] Pager: [phone]

## 2013-04-05 LAB — HEMOGLOBIN A1C
HEMOGLOBIN A1C: 8.2 % — AB (ref <5.7)
MEAN PLASMA GLUCOSE: 189 mg/dL — AB (ref <117)

## 2013-04-05 LAB — GLUCOSE, CAPILLARY
GLUCOSE-CAPILLARY: 182 mg/dL — AB (ref 70–99)
GLUCOSE-CAPILLARY: 124 mg/dL — AB (ref 70–99)

## 2013-04-05 LAB — CBC
HCT: 33.6 % — ABNORMAL LOW (ref 39.0–52.0)
HEMOGLOBIN: 9.7 g/dL — AB (ref 13.0–17.0)
MCH: 19.1 pg — ABNORMAL LOW (ref 26.0–34.0)
MCHC: 28.9 g/dL — ABNORMAL LOW (ref 30.0–36.0)
MCV: 66.3 fL — ABNORMAL LOW (ref 78.0–100.0)
PLATELETS: 268 10*3/uL (ref 150–400)
RBC: 5.07 MIL/uL (ref 4.22–5.81)
RDW: 20.2 % — ABNORMAL HIGH (ref 11.5–15.5)
WBC: 10.1 10*3/uL (ref 4.0–10.5)

## 2013-04-05 LAB — BASIC METABOLIC PANEL
BUN: 28 mg/dL — ABNORMAL HIGH (ref 6–23)
BUN: 27 mg/dL — ABNORMAL HIGH (ref 6–23)
CALCIUM: 8 mg/dL — AB (ref 8.4–10.5)
CHLORIDE: 92 meq/L — AB (ref 96–112)
CO2: 27 mEq/L (ref 19–32)
CO2: 29 meq/L (ref 19–32)
Calcium: 8.2 mg/dL — ABNORMAL LOW (ref 8.4–10.5)
Chloride: 91 mEq/L — ABNORMAL LOW (ref 96–112)
Creatinine, Ser: 1.17 mg/dL (ref 0.50–1.35)
Creatinine, Ser: 1.08 mg/dL (ref 0.50–1.35)
GFR calc Af Amer: 79 mL/min — ABNORMAL LOW (ref >90)
GFR calc Af Amer: 87 mL/min — ABNORMAL LOW (ref >90)
GFR calc non Af Amer: 68 mL/min — ABNORMAL LOW (ref >90)
GFR calc non Af Amer: 75 mL/min — ABNORMAL LOW (ref >90)
GLUCOSE: 189 mg/dL — AB (ref 70–99)
GLUCOSE: 217 mg/dL — AB (ref 70–99)
POTASSIUM: 4.1 meq/L (ref 3.7–5.3)
POTASSIUM: 4 meq/L (ref 3.7–5.3)
SODIUM: 131 meq/L — AB (ref 137–147)
SODIUM: 133 meq/L — AB (ref 137–147)

## 2013-04-05 LAB — TROPONIN I: Troponin I: <0.3 ng/mL (ref <0.30)

## 2013-04-05 MED ORDER — METOPROLOL TARTRATE 50 MG PO TABS
50.0000 mg | ORAL_TABLET | Freq: Two times a day (BID) | ORAL | Status: DC
  Administered 2013-04-05 (×2): 50 mg via ORAL
  Filled 2013-04-05 (×4): qty 1

## 2013-04-05 MED ORDER — METOPROLOL TARTRATE 25 MG PO TABS
25.0000 mg | ORAL_TABLET | Freq: Two times a day (BID) | ORAL | Status: DC
  Filled 2013-04-05 (×2): qty 1

## 2013-04-05 MED ORDER — INFLUENZA VAC SPLIT QUAD 0.5 ML IM SUSP
0.5000 mL | INTRAMUSCULAR | Status: DC
  Filled 2013-04-05: qty 0.5

## 2013-04-05 MED ORDER — FUROSEMIDE 10 MG/ML IJ SOLN
40.0000 mg | Freq: Three times a day (TID) | INTRAMUSCULAR | Status: DC
  Administered 2013-04-05 – 2013-04-06 (×3): 40 mg via INTRAVENOUS
  Filled 2013-04-05 (×5): qty 4

## 2013-04-05 MED ORDER — RIVAROXABAN 20 MG PO TABS
20.0000 mg | ORAL_TABLET | Freq: Every day | ORAL | Status: DC
  Administered 2013-04-05 – 2013-04-06 (×2): 20 mg via ORAL
  Filled 2013-04-05 (×3): qty 1

## 2013-04-05 MED ORDER — ZOLPIDEM TARTRATE 5 MG PO TABS
5.0000 mg | ORAL_TABLET | Freq: Once | ORAL | Status: AC
  Administered 2013-04-05: 5 mg via ORAL
  Filled 2013-04-05: qty 1

## 2013-04-05 MED ORDER — METOPROLOL TARTRATE 1 MG/ML IV SOLN
5.0000 mg | Freq: Four times a day (QID) | INTRAVENOUS | Status: DC | PRN
  Administered 2013-04-05: 5 mg via INTRAVENOUS
  Filled 2013-04-05: qty 5

## 2013-04-05 MED ORDER — INSULIN ASPART 100 UNIT/ML ~~LOC~~ SOLN
0.0000 [IU] | Freq: Three times a day (TID) | SUBCUTANEOUS | Status: DC
  Administered 2013-04-05: 2 [IU] via SUBCUTANEOUS
  Administered 2013-04-06: 1 [IU] via SUBCUTANEOUS
  Administered 2013-04-06: 3 [IU] via SUBCUTANEOUS
  Administered 2013-04-06: 2 [IU] via SUBCUTANEOUS
  Administered 2013-04-07: 3 [IU] via SUBCUTANEOUS

## 2013-04-05 MED ORDER — POTASSIUM CHLORIDE CRYS ER 20 MEQ PO TBCR
20.0000 meq | EXTENDED_RELEASE_TABLET | Freq: Every day | ORAL | Status: DC
  Administered 2013-04-05 – 2013-04-07 (×3): 20 meq via ORAL
  Filled 2013-04-05 (×3): qty 1

## 2013-04-05 MED ORDER — LEVOFLOXACIN 750 MG PO TABS
750.0000 mg | ORAL_TABLET | Freq: Every day | ORAL | Status: DC
  Administered 2013-04-05 – 2013-04-07 (×3): 750 mg via ORAL
  Filled 2013-04-05 (×3): qty 1

## 2013-04-05 NOTE — Care Management Note (Unsigned)
    Page 1 of 1   04/06/2013     4:30:19 PM   CARE MANAGEMENT NOTE 04/06/2013  Patient:  Jacob [name], Jacob [name]   Account Number:  [id number]  Date Initiated:  04/05/2013  Documentation initiated by:  Aarika Moon  Subjective/Objective Assessment:   PT ADM ON 1/10 WITH CHF, AFIB.  PTA, PT INDEPENDENT OF ADLS.     Action/Plan:   PT HAS NO PCP AND HAS OUT OF STATE MEDICAID.   Anticipated DC Date:  04/07/2013   Anticipated DC Plan:  HOME/SELF CARE  In-house referral  Financial Counselor      DC Planning Services  CM consult      Choice offered to / List presented to:             Status of service:  In process, will continue to follow Medicare Important Message given?   (If response is "NO", the following Medicare IM given date fields will be blank) Date Medicare IM given:   Date Additional Medicare IM given:    Discharge Disposition:    Per UR Regulation:  Reviewed for med. necessity/level of care/duration of stay  If discussed at Long Length of Stay Meetings, dates discussed:    Comments:  04/06/13 Honesty Menta,RN,BSN [phone] PT GIVEN 30 DAY FREE TRIAL CARD FOR XARELTO.  DISCUSSED [facility] WITH [facility].  PT AWARE TO ACTIVATE CARD PRIOR TO USE.  HE MAY QUALIFY FOR MATCH PROGRAM.  HE STATES HE PLANS TO STAY WITH A FRIEND AT DC.  04/05/13 Latarshia Jersey,RN,BSN [id number] FINANCIAL COUNSELOR CONTACTED TO SPEAK WITH PT REGARDING APPLYING FOR [place] MEDICAID.  FOLLOW UP APPT MADE FOR PT AT [facility] FOR JAN 22 AT 11AM.  PT WILL DC ON XARELTO.  HE WILL BE GIVEN 30 DAY FREE TRIAL CARD.  CSW AT HEALTH CLINIC WILL ASSIST IN OBTAINING LONG-TERM XARELTO.

## 2013-04-05 NOTE — Progress Notes (Signed)
The Chaplain offered emotional and spiritual support to the patient today. The Chaplain and patient had a nice visit together and the patient was very generous and had a very welcoming spirit towards the Chaplain today. During the end of their visit together, the Chaplain asked the patient if he needed prayer and the patient replied, "yes"! The Chaplain prayed with the patient.  Chaplain Bryson Ha Adair Lemar

## 2013-04-05 NOTE — Progress Notes (Signed)
Utilization Review Completed.Danyon Mcginness T1/02/2014  

## 2013-04-05 NOTE — Progress Notes (Signed)
PATIENT DETAILS Name: Jacob PunchesYahia Battaglia Age: 58 y.o. Sex: male Date of Birth: [birth year] Admit Date: 04/04/2013 Admitting Physician Houston SirenPeter Le, MD PCP:No PCP Per Patient  Subjective: Still complains of shortness of breath.  Assessment/Plan: Active Problems:   Acute on chronic combined systolic and diastolic heart failure - Continue with Lasix. Have started metoprolol. - Suspect decompensation secondary to atrial fibrillation with rapid ventricular response - Appreciate cardiology evaluation  Atrial fibrillation with rapid ventricular response - Heart rate in the low 100's - Start metoprolol twice a day, will use IV metoprolol as needed if heart rate more than 120. If continues to be persistently tachycardic may need to start IV Cardizem. - Cardiology recommending Xarelto-have asked case management to assist with medications. - Continue to monitor in telemetry  Shortness of breath - Multifactorial, suspect secondary to acute congestive heart failure, atrial fibrillation with a ventricular response, and acute bronchitis  - Continue Lasix, started Levaquin. Continue metoprolol for rate control. - Thankfully not hypoxic.  History of CAD status post CABG - Cardiac enzymes negative - Stop aspirin, as patient will be on Xarelto. - Start statin, continue beta blockers  History of ischemic dilated cardiomyopathy - Status post AICD placement  History of diabetes - A1c in October/2014- 6.8, non-compliance with medication - Recheck A1c, monitor CBGs and use sliding scale insulin  Hypertension - Started on Lopressor today, continue to follow BP treatment and adjust medications accordingly  Noncompliance to medications - Have counseled extensively  Disposition: Remain inpatient  DVT Prophylaxis: Not needed as on Xarelto  Code Status: Full code   Family Communication None  Procedures:  None  CONSULTS:  cardiology  Time spent 40 minutes-which includes 50% of the time  with face-to-face with patient and coordinating care related to the above assessment and plan.    MEDICATIONS: Scheduled Meds: . albuterol  2.5 mg Nebulization TID  . furosemide  40 mg Intravenous Q8H  . [START ON 04/06/2013] influenza vac split quadrivalent PF  0.5 mL Intramuscular Tomorrow-1000  . levofloxacin  750 mg Oral Daily  . metoprolol tartrate  50 mg Oral BID  . rivaroxaban  20 mg Oral Q supper  . sodium chloride  3 mL Intravenous Q12H   Continuous Infusions:  PRN Meds:.albuterol, metoprolol  Antibiotics: Anti-infectives   Start     Dose/Rate Route Frequency Ordered Stop   04/05/13 1000  levofloxacin (LEVAQUIN) tablet 750 mg     750 mg Oral Daily 04/05/13 0903         PHYSICAL EXAM: Vital signs in last 24 hours: Filed Vitals:   04/04/13 1939 04/05/13 0437 04/05/13 0826 04/05/13 1011  BP: 122/86 125/72  119/75  Pulse: 106 104  109  Temp: 97.7 F (36.5 C) 97.4 F (36.3 C)    TempSrc: Oral Oral    Resp: 19 18  18   Height:      Weight:      SpO2: 95% 96% 95% 96%    Weight change:  Filed Weights   04/03/13 2357  Weight: 92.534 kg (204 lb)   Body mass index is 31.94 kg/(m^2).   Gen Exam: Awake and alert with clear speech.   Neck: Supple, No JVD.   Chest: B/L Clear.  A few bibasilar rales CVS: S1 S2 Regular, no murmurs.  Abdomen: soft, BS +, non tender, non distended.  Extremities: no edema, lower extremities warm to touch. Neurologic: Non Focal.   Skin: No Rash.   Wounds: N/A.    Intake/Output from previous day:  Intake/Output Summary (Last 24 hours) at 04/05/13 1313 Last data filed at 04/05/13 1200  Gross per 24 hour  Intake   1120 ml  Output   1900 ml  Net   -780 ml     LAB RESULTS: CBC  Recent Labs Lab 04/04/13 0319 04/04/13 1418 04/05/13 0503  WBC 11.2* 11.0* 10.1  HGB 11.0* 10.9* 9.7*  HCT 37.8* 37.9* 33.6*  PLT 335 297 268  MCV 68.0* 67.9* 66.3*  MCH 19.8* 19.5* 19.1*  MCHC 29.1* 28.8* 28.9*  RDW 20.4* 19.9* 20.2*   LYMPHSABS 1.3  --   --   MONOABS 1.0  --   --   EOSABS 0.1  --   --   BASOSABS 0.0  --   --     Chemistries   Recent Labs Lab 04/04/13 0319 04/04/13 1418 04/05/13 0503 04/05/13 1110  NA 134*  --  131* 133*  K 4.8  --  4.1 4.0  CL 95*  --  91* 92*  CO2 28  --  27 29  GLUCOSE 194*  --  189* 217*  BUN 28*  --  28* 27*  CREATININE 1.17 1.11 1.17 1.08  CALCIUM 8.2*  --  8.0* 8.2*    CBG: No results found for this basename: GLUCAP,  in the last 168 hours  GFR Estimated Creatinine Clearance: 82.9 ml/min (by C-G formula based on Cr of 1.08).  Coagulation profile  Recent Labs Lab 04/04/13 0319  INR 1.54*    Cardiac Enzymes  Recent Labs Lab 04/04/13 1418 04/04/13 2030 04/05/13 0503  TROPONINI <0.30 <0.30 <0.30    No components found with this basename: POCBNP,  No results found for this basename: DDIMER,  in the last 72 hours No results found for this basename: HGBA1C,  in the last 72 hours No results found for this basename: CHOL, HDL, LDLCALC, TRIG, CHOLHDL, LDLDIRECT,  in the last 72 hours  Recent Labs  04/04/13 1418  TSH 3.981   No results found for this basename: VITAMINB12, FOLATE, FERRITIN, TIBC, IRON, RETICCTPCT,  in the last 72 hours No results found for this basename: LIPASE, AMYLASE,  in the last 72 hours  Urine Studies No results found for this basename: UACOL, UAPR, USPG, UPH, UTP, UGL, UKET, UBIL, UHGB, UNIT, UROB, ULEU, UEPI, UWBC, URBC, UBAC, CAST, CRYS, UCOM, BILUA,  in the last 72 hours  MICROBIOLOGY: No results found for this or any previous visit (from the past 240 hour(s)).  RADIOLOGY STUDIES/RESULTS: Dg Chest 2 View  04/04/2013   CLINICAL DATA:  Cough, fever.  EXAM: CHEST  2 VIEW  COMPARISON:  February 11, 2013.  FINDINGS: Stable cardiomediastinal silhouette. Status post coronary artery bypass graft. Left-sided pacemaker is unchanged in position. Stable mild central pulmonary vascular congestion is noted. Mild right pleural effusion  is noted which is improved compared to prior exam. Right basilar opacity is improved compared to prior exam, although residual interstitial density remains concerning for edema or subsegmental atelectasis.  IMPRESSION: Right basilar opacity is improved compared to prior exam. Mild right pleural effusion is noted.   Electronically Signed   By: Roque Lias M.D.   On: 04/04/2013 01:08    Jeoffrey Massed, MD  [facility] Pager:[phone]  If 7PM-7AM, please contact night-coverage [email or web address] Password [email or web address] 04/05/2013, 1:13 PM   LOS: 1 day

## 2013-04-05 NOTE — Progress Notes (Signed)
CSW received consult for patient needing assistance with medicaid. Per CM, she will contact financial counseling to help assist or refer patient to DSS. CSW signing off at this time.  Maree Krabbe, MSW, [facility]ors [phone]

## 2013-04-05 NOTE — Consult Note (Signed)
CARDIOLOGY CONSULT NOTE       Patient ID: Jacob [name] MRN: [id number] DOB/AGE: Feb 08, 1956 58 y.o.  Admit date: 04/04/2013 Referring Physician:  [facility]: Primary Physician: No PCP Per Patient Primary Cardiologist:  Nahser/Hochrein Reason for Consultation: CHF  Active Problems:   Chronic atrial fibrillation   Acute on chronic combined systolic and diastolic heart failure   DM (diabetes mellitus)   Noncompliance   Musculoskeletal chest pain   ICD (implantable cardioverter-defibrillator) discharge   Acute on chronic diastolic CHF (congestive heart failure), NYHA class 1   HPI:   The patient has a history of CAD and CABG. His last admission was 10/16 with atrial fib and CHF.  He has history of ischemic DCM.  AICD in [place] and CABG [name] earlier this year  Echo here 12/13 with EF 40-45% with no significant valve disease.  Chronic afib with noncompliance with office f/u and taken off coumadin because he would not get INR checked   Seen in ER 11/20 for mild CHF and rapid EF but did not stay.  Biggest issue is homelessness and lack of insurance.  Admitted with dyspnea and rapid afib.  Poor diet Mild cough ? URI  BNP elevated over 6000  No chest pain R/O  He is unaware of his afib  Has not been taking meds due to lack of money  Denies fever chills Some PND and orthopnea   ROS All other systems reviewed and negative except as noted above  Past Medical History  Diagnosis Date  . Diabetes mellitus   . Hypertension   . Systolic heart failure     EF is 40-45% by echo, December 2013  . Noncompliance   . CAD (coronary artery disease) Sept 2013    s/p cardiac cath showing occlusion of small RCA with collaterals  . Chronic anticoagulation     on coumadin  . Atrial fibrillation     Family History  Problem Relation Age of Onset  . Diabetes Mother     History   Social History  . Marital Status: Divorced    Spouse Name: N/A    Number of Children: 3  . Years of Education: N/A    Occupational History  . Unemployed    Social History Main Topics  . Smoking status: Current Every Day Smoker -- 0.50 packs/day for 20 years    Types: Cigarettes  . Smokeless tobacco: Never Used     Comment: Still with 3 - 4 per day.   . Alcohol Use: No  . Drug Use: No  . Sexual Activity: No   Other Topics Concern  . Not on file   Social History Narrative   Has an apartment with a roommate. He was living on the streets in 01-21-2013.  He reports that his father died in [place] in Jan 21, 2013.  He is divorced.  He is no longer estranged from his son, but still from his daughter who lives locally.  Neither of his parents, nor any siblings have any history of CAD.    Past Surgical History  Procedure Laterality Date  . S/p cardiac cath  Sept 2013  . Coronary artery bypass graft      2 vessels per patient [name]  July 2014  . Implantable cardioverter defibrillator implant      [place] in 07/2012; [employer]     . albuterol  2.5 mg Nebulization TID  . furosemide  40 mg Intravenous Q8H  . heparin  5,000 Units Subcutaneous Q8H  . [  START ON 04/06/2013] influenza vac split quadrivalent PF  0.5 mL Intramuscular Tomorrow-1000  . levofloxacin  750 mg Oral Daily  . metoprolol tartrate  50 mg Oral BID  . sodium chloride  3 mL Intravenous Q12H      Physical Exam: Blood pressure 125/72, pulse 104, temperature 97.4 F (36.3 C), temperature source Oral, resp. rate 18, height 5\' 7"  (1.702 m), weight 204 lb (92.534 kg), SpO2 95.00%.   Affect appropriate Chronically ill male  HEENT: normal Neck supple with no adenopathy JVP normal no bruits no thyromegaly Lungs basilar rales  no wheezing and good diaphragmatic motion Heart:  S1/S2 no murmur, no rub, gallop or click PMI normal Abdomen: benighn, BS positve, no tenderness, no AAA no bruit.  No HSM or HJR Distal pulses intact with no bruits No edema Neuro non-focal Skin warm and dry No muscular weakness   Labs:   Lab  Results  Component Value Date   WBC 10.1 04/05/2013   HGB 9.7* 04/05/2013   HCT 33.6* 04/05/2013   MCV 66.3* 04/05/2013   PLT 268 04/05/2013    Recent Labs Lab 04/05/13 0503  NA 131*  K 4.1  CL 91*  CO2 27  BUN 28*  CREATININE 1.17  CALCIUM 8.0*  GLUCOSE 189*   Lab Results  Component Value Date   CKTOTAL 65 10/02/2011   CKMB 2.8 10/02/2011   TROPONINI <0.30 04/05/2013    Lab Results  Component Value Date   CHOL 114 12/01/2011   CHOL  Value: 140        ATP III CLASSIFICATION:  <200     mg/dL   Desirable  124-580  mg/dL   Borderline High  >=998    mg/dL   High        05/25/8248   Lab Results  Component Value Date   HDL 33* 12/01/2011   HDL 24* 05/31/2008   Lab Results  Component Value Date   LDLCALC 71 12/01/2011   LDLCALC  Value: 99        Total Cholesterol/HDL:CHD Risk Coronary Heart Disease Risk Table                     Men   Women  1/2 Average Risk   3.4   3.3  Average Risk       5.0   4.4  2 X Average Risk   9.6   7.1  3 X Average Risk  23.4   11.0        Use the calculated Patient Ratio above and the CHD Risk Table to determine the patient's CHD Risk.        ATP III CLASSIFICATION (LDL):  <100     mg/dL   Optimal  539-767  mg/dL   Near or Above                    Optimal  130-159  mg/dL   Borderline  341-937  mg/dL   High  >902     mg/dL   Very High 4/0/9735   Lab Results  Component Value Date   TRIG 51 12/01/2011   TRIG 84 05/31/2008   Lab Results  Component Value Date   CHOLHDL 3.5 12/01/2011   CHOLHDL 5.8 05/31/2008   No results found for this basename: LDLDIRECT      Radiology: Dg Chest 2 View  04/04/2013   CLINICAL DATA:  Cough, fever.  EXAM: CHEST  2 VIEW  COMPARISON:  February 11, 2013.  FINDINGS: Stable cardiomediastinal silhouette. Status post coronary artery bypass graft. Left-sided pacemaker is unchanged in position. Stable mild central pulmonary vascular congestion is noted. Mild right pleural effusion is noted which is improved compared to prior exam. Right basilar  opacity is improved compared to prior exam, although residual interstitial density remains concerning for edema or subsegmental atelectasis.  IMPRESSION: Right basilar opacity is improved compared to prior exam. Mild right pleural effusion is noted.   Electronically Signed   By: Roque LiasJames  Green M.D.   On: 04/04/2013 01:08    EKG:  afib rate 103  Nonspecific ST/T wave changes    ASSESSMENT AND PLAN:  Afib:  Asymptomatic but does not help CHF  Lopressor bid  Patient willing to take novel agent but not clear he can afford it.  Will start xarelto.  Have asked social services to see Regarding his medicaid application.  ? [facility]. CHF:  Continue lasix follow BMET  Issue is noncompliance with meds and poor diet.   Pulm:  On levaquin and albuterol  Follow CXR  No signs of sepsis   If social and financial issues can be addressed he will be less likely to bounce back  Signed: Charlton Hawseter Francesco Provencal 04/05/2013, 9:45 AM

## 2013-04-05 NOTE — Progress Notes (Signed)
ANTICOAGULATION CONSULT NOTE - Initial Consult  Pharmacy Consult for Xarelto Indication: atrial fibrillation  No Known Allergies  Patient Measurements: Height: 5\' 7"  (170.2 cm) Weight: 204 lb (92.534 kg) IBW/kg (Calculated) : 66.1   Vital Signs: Temp: 97.4 F (36.3 C) (01/12 0437) Temp src: Oral (01/12 0437) BP: 119/75 mmHg (01/12 1011) Pulse Rate: 109 (01/12 1011)  Labs:  Recent Labs  04/04/13 0319  04/04/13 1418 04/04/13 2030 04/05/13 0503  HGB 11.0*  --  10.9*  --  9.7*  HCT 37.8*  --  37.9*  --  33.6*  PLT 335  --  297  --  268  LABPROT 18.1*  --   --   --   --   INR 1.54*  --   --   --   --   CREATININE 1.17  --  1.11  --  1.17  TROPONINI  --   < > <0.30 <0.30 <0.30  < > = values in this interval not displayed.  Estimated Creatinine Clearance: 76.5 ml/min (by C-G formula based on Cr of 1.17).   Medical History: Past Medical History  Diagnosis Date  . Diabetes mellitus   . Hypertension   . Systolic heart failure     EF is 40-45% by echo, December 2013  . Noncompliance   . CAD (coronary artery disease) Sept 2013    s/p cardiac cath showing occlusion of small RCA with collaterals  . Chronic anticoagulation     on coumadin  . Atrial fibrillation     Assessment: 29 YOM with history of noncompliance. Per H&P patient refused to get INR checks and therefore was not taking warfarin. He was not taking medications PTA. We are starting Xarelto now, however aware that this may need to be switched if insurance and financial resources cannot be worked out. Hgb 9.7- has decreased slightly daily since admission, plts 268- stable. No signs of bleeding, keep watching. SCr 1.17, CrCl ~75-18mL/min.  Goal of Therapy:  Correct dosing based on hepatic and renal function Monitor platelets by anticoagulation protocol: Yes   Plan:  1. Xarelto 20mg  qsupper 2. Follow CBC and for s/s bleeding 3. Follow for financial plans and need to transition to less expensive medication 4.  Follow renal function  Rondell Frick D. Tommi Crepeau, PharmD, BCPS Clinical Pharmacist Pager: [phone] 04/05/2013 10:28 AM

## 2013-04-06 DIAGNOSIS — I517 Cardiomegaly: Secondary | ICD-10-CM

## 2013-04-06 DIAGNOSIS — E119 Type 2 diabetes mellitus without complications: Secondary | ICD-10-CM

## 2013-04-06 DIAGNOSIS — I2510 Atherosclerotic heart disease of native coronary artery without angina pectoris: Secondary | ICD-10-CM

## 2013-04-06 DIAGNOSIS — J189 Pneumonia, unspecified organism: Secondary | ICD-10-CM

## 2013-04-06 DIAGNOSIS — Z95810 Presence of automatic (implantable) cardiac defibrillator: Secondary | ICD-10-CM

## 2013-04-06 DIAGNOSIS — Z91199 Patient's noncompliance with other medical treatment and regimen due to unspecified reason: Secondary | ICD-10-CM

## 2013-04-06 DIAGNOSIS — I4891 Unspecified atrial fibrillation: Secondary | ICD-10-CM

## 2013-04-06 DIAGNOSIS — Z9119 Patient's noncompliance with other medical treatment and regimen: Secondary | ICD-10-CM

## 2013-04-06 DIAGNOSIS — I5043 Acute on chronic combined systolic (congestive) and diastolic (congestive) heart failure: Principal | ICD-10-CM

## 2013-04-06 DIAGNOSIS — I509 Heart failure, unspecified: Secondary | ICD-10-CM

## 2013-04-06 LAB — BASIC METABOLIC PANEL
BUN: 27 mg/dL — ABNORMAL HIGH (ref 6–23)
CHLORIDE: 89 meq/L — AB (ref 96–112)
CO2: 31 mEq/L (ref 19–32)
Calcium: 7.9 mg/dL — ABNORMAL LOW (ref 8.4–10.5)
Creatinine, Ser: 1.18 mg/dL (ref 0.50–1.35)
GFR, EST AFRICAN AMERICAN: 78 mL/min — AB (ref >90)
GFR, EST NON AFRICAN AMERICAN: 67 mL/min — AB (ref >90)
Glucose, Bld: 232 mg/dL — ABNORMAL HIGH (ref 70–99)
POTASSIUM: 3.9 meq/L (ref 3.7–5.3)
SODIUM: 131 meq/L — AB (ref 137–147)

## 2013-04-06 LAB — GLUCOSE, CAPILLARY
GLUCOSE-CAPILLARY: 184 mg/dL — AB (ref 70–99)
Glucose-Capillary: 214 mg/dL — ABNORMAL HIGH (ref 70–99)
Glucose-Capillary: 137 mg/dL — ABNORMAL HIGH (ref 70–99)
Glucose-Capillary: 150 mg/dL — ABNORMAL HIGH (ref 70–99)

## 2013-04-06 MED ORDER — ATORVASTATIN CALCIUM 40 MG PO TABS
40.0000 mg | ORAL_TABLET | Freq: Every day | ORAL | Status: DC
  Administered 2013-04-06: 40 mg via ORAL
  Filled 2013-04-06 (×2): qty 1

## 2013-04-06 MED ORDER — METFORMIN HCL 500 MG PO TABS
500.0000 mg | ORAL_TABLET | Freq: Two times a day (BID) | ORAL | Status: DC
  Administered 2013-04-06 – 2013-04-07 (×2): 500 mg via ORAL
  Filled 2013-04-06 (×4): qty 1

## 2013-04-06 MED ORDER — POTASSIUM CHLORIDE CRYS ER 20 MEQ PO TBCR
40.0000 meq | EXTENDED_RELEASE_TABLET | Freq: Once | ORAL | Status: AC
  Administered 2013-04-06: 40 meq via ORAL
  Filled 2013-04-06: qty 2

## 2013-04-06 MED ORDER — PANTOPRAZOLE SODIUM 40 MG PO TBEC
40.0000 mg | DELAYED_RELEASE_TABLET | Freq: Every day | ORAL | Status: DC
Start: 2013-04-06 — End: 2013-04-07
  Administered 2013-04-06 – 2013-04-07 (×2): 40 mg via ORAL
  Filled 2013-04-06 (×2): qty 1

## 2013-04-06 MED ORDER — FUROSEMIDE 20 MG PO TABS
20.0000 mg | ORAL_TABLET | Freq: Every day | ORAL | Status: DC
  Administered 2013-04-06 – 2013-04-07 (×2): 20 mg via ORAL
  Filled 2013-04-06 (×2): qty 1

## 2013-04-06 MED ORDER — METOPROLOL TARTRATE 50 MG PO TABS
75.0000 mg | ORAL_TABLET | Freq: Two times a day (BID) | ORAL | Status: DC
  Filled 2013-04-06: qty 1

## 2013-04-06 MED ORDER — METOPROLOL TARTRATE 50 MG PO TABS
75.0000 mg | ORAL_TABLET | Freq: Two times a day (BID) | ORAL | Status: DC
  Administered 2013-04-06 – 2013-04-07 (×2): 75 mg via ORAL
  Filled 2013-04-06 (×4): qty 1

## 2013-04-06 NOTE — Progress Notes (Signed)
Patient standing to void and setting in chair H.R. Up to 140-150.  Assist back to bed H.R. Back down 103-116.

## 2013-04-06 NOTE — Progress Notes (Deleted)
Patient Name: Jacob [name] Date of Encounter: 04/06/2013  Active Problems:   HYPERTENSION   CAF-  not previously a Coumadin candidate secondary to complaince   CAP-?- (community acquired pneumonia)- on Lebaquin   Acute on chronic combined systolic and diastolic heart failure   Type 2 diabetes mellitus not at goal   Noncompliance   CAD- s/p CABG July [facility]   Homelessness   Atrial fibrillation with rapid ventricular response   ICD - in place- BS May 2014 [place]   Length of Stay: 2  SUBJECTIVE  The patient states that he feels significantly better, almost back to baseline, however still persistently tachycardic on telemetry.  CURRENT MEDS . albuterol  2.5 mg Nebulization TID  . atorvastatin  40 mg Oral q1800  . furosemide  20 mg Oral Daily  . influenza vac split quadrivalent PF  0.5 mL Intramuscular Tomorrow-1000  . insulin aspart  0-9 Units Subcutaneous TID WC  . levofloxacin  750 mg Oral Daily  . metoprolol tartrate  75 mg Oral BID  . pantoprazole  40 mg Oral Q0600  . potassium chloride  20 mEq Oral Daily  . potassium chloride  40 mEq Oral Once  . rivaroxaban  20 mg Oral Q supper  . sodium chloride  3 mL Intravenous Q12H    OBJECTIVE  Filed Vitals:   04/05/13 2047 04/05/13 2110 04/06/13 0341 04/06/13 0829  BP: 127/90  104/64   Pulse: 105  112   Temp: 98.1 F (36.7 C)  98.6 F (37 C)   TempSrc: Oral  Oral   Resp: 18  18   Height:      Weight:   195 lb 12.8 oz (88.814 kg)   SpO2: 96% 98% 97% 99%    Intake/Output Summary (Last 24 hours) at 04/06/13 1035 Last data filed at 04/06/13 0200  Gross per 24 hour  Intake    480 ml  Output   1800 ml  Net  -1320 ml   Filed Weights   04/03/13 2357 04/06/13 0341  Weight: 204 lb (92.534 kg) 195 lb 12.8 oz (88.814 kg)    PHYSICAL EXAM  General: Pleasant, NAD. Neuro: Alert and oriented X 3. Moves all extremities spontaneously. Psych: Normal affect. HEENT:  Normal  Neck: Supple without bruits or  JVD. Lungs:  Resp regular and unlabored, CTA. Heart: RRR no s3, s4, or murmurs. Abdomen: Soft, non-tender, non-distended, BS + x 4.  Extremities: No clubbing, cyanosis or edema. DP/PT/Radials 2+ and equal bilaterally.  Accessory Clinical Findings  CBC  Recent Labs  04/04/13 0319 04/04/13 1418 04/05/13 0503  WBC 11.2* 11.0* 10.1  NEUTROABS 8.8*  --   --   HGB 11.0* 10.9* 9.7*  HCT 37.8* 37.9* 33.6*  MCV 68.0* 67.9* 66.3*  PLT 335 297 268   Basic Metabolic Panel  Recent Labs  04/05/13 1110 04/06/13 0500  NA 133* 131*  K 4.0 3.9  CL 92* 89*  CO2 29 31  GLUCOSE 217* 232*  BUN 27* 27*  CREATININE 1.08 1.18  CALCIUM 8.2* 7.9*   Cardiac Enzymes  Recent Labs  04/04/13 1418 04/04/13 2030 04/05/13 0503  TROPONINI <0.30 <0.30 <0.30    Recent Labs  04/05/13 1400  HGBA1C 8.2*   Fasting Lipid Panel No results found for this basename: CHOL, HDL, LDLCALC, TRIG, CHOLHDL, LDLDIRECT,  in the last 72 hours Thyroid Function Tests  Recent Labs  04/04/13 1418  TSH 3.981    Radiology/Studies  Dg Chest 2 View  04/04/2013   CLINICAL DATA:  Cough, fever.   IMPRESSION: Right basilar opacity is improved compared to prior exam. Mild right pleural effusion is noted.   Electronically Signed   By: Roque Lias M.D.   On: 04/04/2013 01:08    TELE: A fib with RVR 105-120 BPM   ASSESSMENT AND PLAN  1. Afib with RVR, we will increase metoprolol to 100 mg po BID for better rate control. Regarding anticoagulation, he was previously on Coumadine byt discontinued as he was non-compliant (the patient is homeless with limited resurces). We will consult care management about Phill Mutter that was started yesterday, if unable to take, its safer to stop now.   2. Acute on chronic diastolic CHF, tachycardia induced, improved, almost euvolemic, continue the same dose Lasix   3. Pulm: On levaquin and albuterol for possible pneumonia   4. Hypertension - controlled  5. Diabetes - HbA1c 8.2,  will start metformin  Signed, Tobias Alexander, H MD, [facility] 04/06/2013

## 2013-04-06 NOTE — Progress Notes (Signed)
Patient setting in chair had 9 beats v-tach. Patient voiced no complaints.

## 2013-04-06 NOTE — Progress Notes (Signed)
PATIENT DETAILS Name: Jacob [name] Age: 58 y.o. Sex: male Date of Birth: 04-26-1955 Admit Date: 04/04/2013 Admitting Physician Houston SirenPeter Le, MD PCP:No PCP Per Patient  Subjective: Much improved, claims shortness of breath significantly better. Unfortunately continues to have rapid ventricular response specially with physical activity.  Assessment/Plan: Active Problems:   Acute on chronic combined systolic and diastolic heart failure - Continue with Lasix, but change to oral. Increase metoprolol to 75 mg twice a day - Suspect decompensation secondary to atrial fibrillation with rapid ventricular response - Appreciate cardiology evaluation  Atrial fibrillation with rapid ventricular response - Heart rate in the low 100's specially with physical activity - Increase metoprolol to 75 mg twice a day , will use IV metoprolol as needed if heart rate more than 120. If continues to be persistently tachycardic may need to start IV Cardizem. - Cardiology recommending Xarelto-have asked case management to assist with medications. - Continue to monitor in telemetry  Shortness of breath - Multifactorial, suspect secondary to acute congestive heart failure, atrial fibrillation with a ventricular response, and acute bronchitis  - Continue Lasix, started Levaquin. Continue metoprolol for rate control. - Thankfully not hypoxic.  History of CAD status post CABG - Cardiac enzymes negative - Stop aspirin, as patient will be on Xarelto. - Start statin, continue beta blockers  History of ischemic dilated cardiomyopathy - Status post AICD placement  History of diabetes - A1c in October/2014- 6.8, non-compliance with medication - Recheck A1c on 1/12 8.2, monitor CBGs and use sliding scale insulin. Suspect we'll need metformin to be initiated. Since no cardiac procedures or CT studies are planned, we'll go ahead and start metformin.  Hypertension - On Lopressor, continue to follow BP treatment and  adjust medications accordingly  Noncompliance to medications/followup - Have counseled extensively- [facility] has been [facility] for 1/22  Disposition: Remain inpatient- suspect home on 1/14 or 1/15  DVT Prophylaxis: Not needed as on Xarelto  Code Status: Full code   Family Communication None  Procedures:  None  CONSULTS:  cardiology   MEDICATIONS: Scheduled Meds: . albuterol  2.5 mg Nebulization TID  . atorvastatin  40 mg Oral q1800  . furosemide  40 mg Intravenous Q8H  . influenza vac split quadrivalent PF  0.5 mL Intramuscular Tomorrow-1000  . insulin aspart  0-9 Units Subcutaneous TID WC  . levofloxacin  750 mg Oral Daily  . metoprolol tartrate  75 mg Oral BID  . pantoprazole  40 mg Oral Q0600  . potassium chloride  20 mEq Oral Daily  . rivaroxaban  20 mg Oral Q supper  . sodium chloride  3 mL Intravenous Q12H   Continuous Infusions:  PRN Meds:.albuterol, metoprolol  Antibiotics: Anti-infectives   Start     Dose/Rate Route Frequency Ordered Stop   04/05/13 1000  levofloxacin (LEVAQUIN) tablet 750 mg     750 mg Oral Daily 04/05/13 0903         PHYSICAL EXAM: Vital signs in last 24 hours: Filed Vitals:   04/05/13 2047 04/05/13 2110 04/06/13 0341 04/06/13 0829  BP: 127/90  104/64   Pulse: 105  112   Temp: 98.1 F (36.7 C)  98.6 F (37 C)   TempSrc: Oral  Oral   Resp: 18  18   Height:      Weight:   88.814 kg (195 lb 12.8 oz)   SpO2: 96% 98% 97% 99%    Weight change:  Filed Weights   04/03/13 2357 04/06/13  0341  Weight: 92.534 kg (204 lb) 88.814 kg (195 lb 12.8 oz)   Body mass index is 30.66 kg/(m^2).   Gen Exam: Awake and alert with clear speech.   Neck: Supple, No JVD.   Chest: B/L Clear.  A few bibasilar rales CVS: S1 S2 Regular, no murmurs.  Abdomen: soft, BS +, non tender, non distended.  Extremities: no edema, lower extremities warm to touch. Neurologic: Non Focal.   Skin: No Rash.   Wounds: N/A.     Intake/Output from previous day:  Intake/Output Summary (Last 24 hours) at 04/06/13 1006 Last data filed at 04/06/13 0200  Gross per 24 hour  Intake    480 ml  Output   1800 ml  Net  -1320 ml     LAB RESULTS: CBC  Recent Labs Lab 04/04/13 0319 04/04/13 1418 04/05/13 0503  WBC 11.2* 11.0* 10.1  HGB 11.0* 10.9* 9.7*  HCT 37.8* 37.9* 33.6*  PLT 335 297 268  MCV 68.0* 67.9* 66.3*  MCH 19.8* 19.5* 19.1*  MCHC 29.1* 28.8* 28.9*  RDW 20.4* 19.9* 20.2*  LYMPHSABS 1.3  --   --   MONOABS 1.0  --   --   EOSABS 0.1  --   --   BASOSABS 0.0  --   --     Chemistries   Recent Labs Lab 04/04/13 0319 04/04/13 1418 04/05/13 0503 04/05/13 1110 04/06/13 0500  NA 134*  --  131* 133* 131*  K 4.8  --  4.1 4.0 3.9  CL 95*  --  91* 92* 89*  CO2 28  --  27 29 31   GLUCOSE 194*  --  189* 217* 232*  BUN 28*  --  28* 27* 27*  CREATININE 1.17 1.11 1.17 1.08 1.18  CALCIUM 8.2*  --  8.0* 8.2* 7.9*    CBG:  Recent Labs Lab 04/05/13 1655 04/05/13 2122 04/06/13 0605  GLUCAP 182* 124* 214*    GFR Estimated Creatinine Clearance: 74.4 ml/min (by C-G formula based on Cr of 1.18).  Coagulation profile  Recent Labs Lab 04/04/13 0319  INR 1.54*    Cardiac Enzymes  Recent Labs Lab 04/04/13 1418 04/04/13 2030 04/05/13 0503  TROPONINI <0.30 <0.30 <0.30    No components found with this basename: POCBNP,  No results found for this basename: DDIMER,  in the last 72 hours  Recent Labs  04/05/13 1400  HGBA1C 8.2*   No results found for this basename: CHOL, HDL, LDLCALC, TRIG, CHOLHDL, LDLDIRECT,  in the last 72 hours  Recent Labs  04/04/13 1418  TSH 3.981   No results found for this basename: VITAMINB12, FOLATE, FERRITIN, TIBC, IRON, RETICCTPCT,  in the last 72 hours No results found for this basename: LIPASE, AMYLASE,  in the last 72 hours  Urine Studies No results found for this basename: UACOL, UAPR, USPG, UPH, UTP, UGL, UKET, UBIL, UHGB, UNIT, UROB, ULEU,  UEPI, UWBC, URBC, UBAC, CAST, CRYS, UCOM, BILUA,  in the last 72 hours  MICROBIOLOGY: No results found for this or any previous visit (from the past 240 hour(s)).  RADIOLOGY STUDIES/RESULTS: Dg Chest 2 View  04/04/2013   CLINICAL DATA:  Cough, fever.  EXAM: CHEST  2 VIEW  COMPARISON:  February 11, 2013.  FINDINGS: Stable cardiomediastinal silhouette. Status post coronary artery bypass graft. Left-sided pacemaker is unchanged in position. Stable mild central pulmonary vascular congestion is noted. Mild right pleural effusion is noted which is improved compared to prior exam. Right basilar opacity is improved compared to  prior exam, although residual interstitial density remains concerning for edema or subsegmental atelectasis.  IMPRESSION: Right basilar opacity is improved compared to prior exam. Mild right pleural effusion is noted.   Electronically Signed   By: Roque Lias M.D.   On: 04/04/2013 01:08    Jeoffrey Massed, MD  [facility] Pager:[phone]  If 7PM-7AM, please contact night-coverage [email or web address] Password [email or web address] 04/06/2013, 10:06 AM   LOS: 2 days

## 2013-04-06 NOTE — Progress Notes (Signed)
Pharmacist Heart Failure Core Measure Documentation  Assessment: Jacob [name] has an EF documented as 40-45% on 03/09/2012 by Dr. Jens Som.  Rationale: Heart failure patients with left ventricular systolic dysfunction (LVSD) and an EF < 40% should be prescribed an angiotensin converting enzyme inhibitor (ACEI) or angiotensin receptor blocker (ARB) at discharge unless a contraindication is documented in the medical record.  This patient is not currently on an ACEI or ARB for HF.  This note is being placed in the record in order to provide documentation that a contraindication to the use of these agents is present for this encounter.  ACE Inhibitor or Angiotensin Receptor Blocker is contraindicated (specify all that apply)  []   ACEI allergy AND ARB allergy []   Angioedema []   Moderate or severe aortic stenosis []   Hyperkalemia [x]   Hypotension []   Renal artery stenosis []   Worsening renal function, preexisting renal disease or dysfunction   Jacob [name] 04/06/2013 10:53 AM

## 2013-04-06 NOTE — Progress Notes (Signed)
  Echocardiogram 2D Echocardiogram has been performed.  Cathie Beams 04/06/2013, 3:27 PM

## 2013-04-06 NOTE — Progress Notes (Addendum)
Subjective:  SOB improved.  Objective:  Vital Signs in the last 24 hours: Temp:  [98 F (36.7 C)-98.6 F (37 C)] 98.6 F (37 C) (01/13 0341) Pulse Rate:  [101-112] 112 (01/13 0341) Resp:  [18] 18 (01/13 0341) BP: (104-127)/(64-90) 104/64 mmHg (01/13 0341) SpO2:  [96 %-99 %] 99 % (01/13 0829) Weight:  [195 lb 12.8 oz (88.814 kg)] 195 lb 12.8 oz (88.814 kg) (01/13 0341)  Intake/Output from previous day:  Intake/Output Summary (Last 24 hours) at 04/06/13 0936 Last data filed at 04/06/13 0200  Gross per 24 hour  Intake    480 ml  Output   1800 ml  Net  -1320 ml    Physical Exam: General appearance: alert, cooperative, no distress and poor dentition Lungs: clear to auscultation bilaterally Heart: irregularly irregular rhythm Extremities: mo edema   Rate: 110  Rhythm: atrial fibrillation and run of 9 bt NSVT reported (I could not find this on monitor)  Lab Results:  Recent Labs  04/04/13 1418 04/05/13 0503  WBC 11.0* 10.1  HGB 10.9* 9.7*  PLT 297 268    Recent Labs  04/05/13 1110 04/06/13 0500  NA 133* 131*  K 4.0 3.9  CL 92* 89*  CO2 29 31  GLUCOSE 217* 232*  BUN 27* 27*  CREATININE 1.08 1.18    Recent Labs  04/04/13 2030 04/05/13 0503  TROPONINI <0.30 <0.30    Recent Labs  04/04/13 0319  INR 1.54*    Imaging: Imaging results have been reviewed  Cardiac Studies:  Assessment/Plan:   Active Problems:   Acute on chronic combined systolic and diastolic heart failure   Atrial fibrillation with rapid ventricular response   CAF-  not previously a Coumadin candidate secondary to complaince   CAP-?- (community acquired pneumonia)- on Lebaquin   Type 2 diabetes mellitus not at goal   Noncompliance   CAD- s/p CABG July 2014 [facility]   HYPERTENSION   Homelessness   ICD - in place- BS May 2014 [place]    PLAN: 58 y/o [place] male with CAD, ICM, ICD, poor social situation adm with CHF and rapid AF. He is on IV Lasix and Metoprolol 50 mg  BID. Wgt down 10 lbs, consider changing Lasix to po and decreasing the dose. He is now on Xarelto. Echo pending. Add statin, low dose ASA, and PPI. Hgb 9.7, check stools. B/P may be too low for ACE. Case manager working on [employer] and arranging follow up. ? Discharge 24-48 hrs, f/u BMP, BNP. Lipids, Mg++ in am.   Corine Shelter PA-C Beeper [phone] 04/06/2013, 9:36 AM   TELE: A fib with RVR 105-120 BPM  ASSESSMENT AND PLAN  1. Afib with RVR, we will increase metoprolol to 75 mg po BID for better rate control. Regarding anticoagulation, he was previously on Coumadine byt discontinued as he was non-compliant (the patient is homeless with limited resurces). We will consult care management about Phill Mutter that was started yesterday, if unable to take, its safer to stop now.  2. Acute on chronic diastolic CHF, tachycardia induced, improved, significant diuresis, almost euvolemic, continue the same dose Lasix until tomorrow 3. Pulm: On levaquin and albuterol for possible pneumonia  4. Hypertension - controlled  5. Diabetes - HbA1c 8.2, will start metformin   Signed,  Tobias Alexander, H MD, [facility]  04/06/2013

## 2013-04-07 ENCOUNTER — Telehealth: Payer: Self-pay | Admitting: Cardiovascular Disease

## 2013-04-07 DIAGNOSIS — Z5900 Homelessness unspecified: Secondary | ICD-10-CM

## 2013-04-07 DIAGNOSIS — I10 Essential (primary) hypertension: Secondary | ICD-10-CM

## 2013-04-07 LAB — LIPID PANEL
Cholesterol: 62 mg/dL (ref 0–200)
HDL: 15 mg/dL — ABNORMAL LOW (ref >39)
LDL Cholesterol: 36 mg/dL (ref 0–99)
Total CHOL/HDL Ratio: 4.1 RATIO
Triglycerides: 56 mg/dL (ref <150)
VLDL: 11 mg/dL (ref 0–40)

## 2013-04-07 LAB — CBC
HCT: 37.2 % — ABNORMAL LOW (ref 39.0–52.0)
Hemoglobin: 10.6 g/dL — ABNORMAL LOW (ref 13.0–17.0)
MCH: 19.3 pg — ABNORMAL LOW (ref 26.0–34.0)
MCHC: 28.5 g/dL — ABNORMAL LOW (ref 30.0–36.0)
MCV: 67.8 fL — ABNORMAL LOW (ref 78.0–100.0)
Platelets: 290 10*3/uL (ref 150–400)
RBC: 5.49 MIL/uL (ref 4.22–5.81)
RDW: 20.8 % — ABNORMAL HIGH (ref 11.5–15.5)
WBC: 10.5 10*3/uL (ref 4.0–10.5)

## 2013-04-07 LAB — MAGNESIUM: Magnesium: 1.6 mg/dL (ref 1.5–2.5)

## 2013-04-07 LAB — COMPREHENSIVE METABOLIC PANEL
ALT: 52 U/L (ref 0–53)
AST: 14 U/L (ref 0–37)
Albumin: 2.5 g/dL — ABNORMAL LOW (ref 3.5–5.2)
Alkaline Phosphatase: 194 U/L — ABNORMAL HIGH (ref 39–117)
BUN: 30 mg/dL — ABNORMAL HIGH (ref 6–23)
CO2: 34 mEq/L — ABNORMAL HIGH (ref 19–32)
Calcium: 8 mg/dL — ABNORMAL LOW (ref 8.4–10.5)
Chloride: 90 mEq/L — ABNORMAL LOW (ref 96–112)
Creatinine, Ser: 1.32 mg/dL (ref 0.50–1.35)
GFR calc Af Amer: 68 mL/min — ABNORMAL LOW (ref >90)
GFR calc non Af Amer: 59 mL/min — ABNORMAL LOW (ref >90)
Glucose, Bld: 157 mg/dL — ABNORMAL HIGH (ref 70–99)
Potassium: 4.5 mEq/L (ref 3.7–5.3)
Sodium: 131 mEq/L — ABNORMAL LOW (ref 137–147)
Total Bilirubin: 1 mg/dL (ref 0.3–1.2)
Total Protein: 6.4 g/dL (ref 6.0–8.3)

## 2013-04-07 LAB — PRO B NATRIURETIC PEPTIDE: Pro B Natriuretic peptide (BNP): 3282 pg/mL — ABNORMAL HIGH (ref 0–125)

## 2013-04-07 LAB — GLUCOSE, CAPILLARY
GLUCOSE-CAPILLARY: 249 mg/dL — AB (ref 70–99)
GLUCOSE-CAPILLARY: 111 mg/dL — AB (ref 70–99)

## 2013-04-07 MED ORDER — METFORMIN HCL 500 MG PO TABS: 500.0000 mg | ORAL_TABLET | Freq: Two times a day (BID) | ORAL | Status: DC

## 2013-04-07 MED ORDER — METOPROLOL TARTRATE 25 MG PO TABS: 75.0000 mg | ORAL_TABLET | Freq: Two times a day (BID) | ORAL | Status: DC

## 2013-04-07 MED ORDER — ATORVASTATIN CALCIUM 40 MG PO TABS: 40.0000 mg | ORAL_TABLET | Freq: Every day | ORAL | Status: DC

## 2013-04-07 MED ORDER — LEVOFLOXACIN 750 MG PO TABS: 750.0000 mg | ORAL_TABLET | Freq: Every day | ORAL | Status: DC

## 2013-04-07 MED ORDER — RIVAROXABAN 20 MG PO TABS: 20.0000 mg | ORAL_TABLET | Freq: Every day | ORAL | Status: DC

## 2013-04-07 MED ORDER — ZOLPIDEM TARTRATE 5 MG PO TABS: 5.0000 mg | ORAL_TABLET | Freq: Once | ORAL | Status: DC

## 2013-04-07 NOTE — Telephone Encounter (Signed)
New message   7 day TCM with Herma Carson. appt on 1/21 @ 8:15 . Per [place] PA

## 2013-04-07 NOTE — Progress Notes (Signed)
ANTICOAGULATION CONSULT NOTE  Pharmacy Consult for Xarelto Indication: atrial fibrillation  No Known Allergies  Patient Measurements: Height: 5\' 7"  (170.2 cm) Weight: 196 lb 13.9 oz (89.3 kg) IBW/kg (Calculated) : 66.1   Vital Signs: Temp: 100.3 F (37.9 C) (01/14 0439) Temp src: Oral (01/14 0439) BP: 108/79 mmHg (01/14 0439) Pulse Rate: 104 (01/14 0439)  Labs:  Recent Labs  04/04/13 1418 04/04/13 2030 04/05/13 0503 04/05/13 1110 04/06/13 0500 04/07/13 0420  HGB 10.9*  --  9.7*  --   --  10.6*  HCT 37.9*  --  33.6*  --   --  37.2*  PLT 297  --  268  --   --  290  CREATININE 1.11  --  1.17 1.08 1.18 1.32  TROPONINI <0.30 <0.30 <0.30  --   --   --     Estimated Creatinine Clearance: 66.6 ml/min (by C-G formula based on Cr of 1.32).  Assessment: 16 YOM with history of noncompliance. Case management has worked out for him to have a 30 days free card and also have follow-up at [facility]	[place] [facility] which will help him obtain long term Xarelto. CBC has been stable and no bleeding noted. Provided education for patient today and answered all questions.  Goal of Therapy:  Correct dosing based on hepatic and renal function Monitor platelets by anticoagulation protocol: Yes   Plan:  1. Xarelto 20mg  qsupper 2. Follow CBC and for s/s bleeding 3. Follow renal function  Salwa Bai D. Gareld Obrecht, PharmD, BCPS Clinical Pharmacist Pager: [phone] 04/07/2013 10:56 AM

## 2013-04-07 NOTE — Progress Notes (Signed)
The Chaplain stopped by to visit with the patient before he got discharged from the hospital. The patient thanked the Chaplain for stopping by to visit him before he was discharged from the hospital and then a few moments later the visit came to an end.  Chaplain Bryson Ha Keeghan Bialy

## 2013-04-07 NOTE — Progress Notes (Signed)
Subjective:  The patient feels well, started to walk around the floor. The patient wants to go home.  Marland Kitchen. atorvastatin  40 mg Oral q1800  . furosemide  20 mg Oral Daily  . influenza vac split quadrivalent PF  0.5 mL Intramuscular Tomorrow-1000  . insulin aspart  0-9 Units Subcutaneous TID WC  . levofloxacin  750 mg Oral Daily  . metFORMIN  500 mg Oral BID WC  . metoprolol tartrate  75 mg Oral BID  . pantoprazole  40 mg Oral Q0600  . potassium chloride  20 mEq Oral Daily  . rivaroxaban  20 mg Oral Q supper  . sodium chloride  3 mL Intravenous Q12H  . zolpidem  5 mg Oral Once   Objective:  Vital Signs in the last 24 hours: Temp:  [97.9 F (36.6 C)-100.3 F (37.9 C)] 100.3 F (37.9 C) (01/14 0439) Pulse Rate:  [83-108] 104 (01/14 0439) Resp:  [18-20] 20 (01/14 0439) BP: (90-108)/(56-79) 108/79 mmHg (01/14 0439) SpO2:  [95 %-98 %] 95 % (01/14 0439) Weight:  [196 lb 13.9 oz (89.3 kg)] 196 lb 13.9 oz (89.3 kg) (01/14 0439)  Intake/Output from previous day:  Intake/Output Summary (Last 24 hours) at 04/07/13 1121 Last data filed at 04/07/13 0900  Gross per 24 hour  Intake    840 ml  Output    175 ml  Net    665 ml    Physical Exam: General appearance: alert, cooperative, no distress and poor dentition Lungs: clear to auscultation bilaterally Heart: irregularly irregular rhythm Extremities: mo edema   Rate: 110  Rhythm: atrial fibrillation and run of 9 bt NSVT reported (I could not find this on monitor)  Lab Results:  Recent Labs  04/05/13 0503 04/07/13 0420  WBC 10.1 10.5  HGB 9.7* 10.6*  PLT 268 290    Recent Labs  04/06/13 0500 04/07/13 0420  NA 131* 131*  K 3.9 4.5  CL 89* 90*  CO2 31 34*  GLUCOSE 232* 157*  BUN 27* 30*  CREATININE 1.18 1.32    Recent Labs  04/04/13 2030 04/05/13 0503  TROPONINI <0.30 <0.30   No results found for this basename: INR,  in the last 72 hours  Imaging: Imaging results have been reviewed  Cardiac  Studies:  Assessment/Plan:   Active Problems:   HYPERTENSION   CAF-  not previously a Coumadin candidate secondary to complaince   CAP-?- (community acquired pneumonia)- on Lebaquin   Acute on chronic combined systolic and diastolic heart failure   Type 2 diabetes mellitus not at goal   Noncompliance   CAD- [facility]   Homelessness   Atrial fibrillation with rapid ventricular response   ICD - in place- BS May 2014 [place]    PLAN: 58 y/o [place] male with CAD, ICM, ICD, poor social situation adm with CHF and rapid AF. He is on IV Lasix and Metoprolol 50 mg BID. Wgt down 10 lbs, consider changing Lasix to po and decreasing the dose. He is now on Xarelto. Echo pending. Add statin, low dose ASA, and PPI. Hgb 9.7, check stools. B/P may be too low for ACE. Case manager working on [employer] and arranging follow up. ? Discharge 24-48 hrs, f/u BMP, BNP. Lipids, Mg++ in am.   Corine ShelterLuke Kilroy PA-C Beeper [phone] 04/07/2013, 11:21 AM   TELE: A fib with RVR 105-120 BPM  ASSESSMENT AND PLAN   1. Afib with RVR, we will increase metoprolol to 75  mg po BID for better rate control. Expecting better rate control once acute infection is resolved. Regarding anticoagulation, he was previously on Coumadine byt discontinued as he was non-compliant (the patient is homeless with limited resurces). We will consult care management about Xarelto that was started yesterday, if unable to take, its safer to stop now.   2. Acute on chronic diastolic CHF, LVEF 20-25%, tachycardia induced, improved, significant diuresis this admission, crea mildly worsening, we will hold lasix and follow early in the clinic  3. Pulm: On levaquin and albuterol for possible pneumonia   4. Hypertension - controlled   5. Diabetes - HbA1c 8.2, started on metformin yesterday  Follow up in our clinic in 7-10 days  Signed,  Tobias Alexander, H MD, [facility]  04/06/2013

## 2013-04-07 NOTE — Discharge Instructions (Signed)
Information on my medicine - XARELTO (Rivaroxaban)  This medication education was reviewed with me or my healthcare representative as part of my discharge preparation.  The pharmacist that spoke with me during my hospital stay was:  Zineb Glade, RPH  Why was Xarelto prescribed for you? Xarelto was prescribed for you to reduce the risk of forming blood clots that cause a stroke if you have a medical condition called atrial fibrillation (a type of irregular heartbeat).  What do you need to know about xarelto ? Take your Xarelto ONCE DAILY at the same time every day with your evening meal. If you have difficulty swallowing the tablet whole, you may crush it and mix in applesauce just prior to taking your dose.  Take Xarelto exactly as prescribed by your doctor and DO NOT stop taking Xarelto without talking to the doctor who prescribed the medication.  Stopping without other stroke or VTE prevention medication to take the place of Xarelto may increase your risk of developing a new clot or stroke.  Refill your prescription before you run out.  After discharge, you should have regular check-up appointments with your healthcare provider that is prescribing your Xarelto.  In the future your dose may need to be changed if your kidney function or weight changes by a significant amount.  What do you do if you miss a dose? If you are taking Xarelto ONCE DAILY and you miss a dose, take it as soon as you remember on the same day then continue your regularly scheduled once daily regimen the next day. Do not take two doses of Xarelto at the same time.   Important Safety Information A possible side effect of Xarelto is bleeding. You should call your healthcare provider right away if you experience any of the following:   Bleeding from an injury or your nose that does not stop.   Unusual colored urine (red or dark brown) or unusual colored stools (red or black).   Unusual bruising for unknown  reasons.   A serious fall or if you hit your head (even if there is no bleeding).  Some medicines may interact with Xarelto and might increase your risk of bleeding while on Xarelto. To help avoid this, consult your healthcare provider or pharmacist prior to using any new prescription or non-prescription medications, including herbals, vitamins, non-steroidal anti-inflammatory drugs (NSAIDs) and supplements.  This website has more information on Xarelto: VisitDestination.com.br.

## 2013-04-07 NOTE — Discharge Summary (Signed)
Physician Discharge Summary  Jacob [name] [id number] DOB: 1956/03/02 DOA: 04/04/2013  PCP: No PCP Per Patient  Admit date: 04/04/2013 Discharge date: 04/07/2013  Time spent: 35 minutes  Recommendations for Outpatient Follow-up:  1. FLP, LFTs 3 months 2. 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 male with hx of chronic afib, previously on coumadin, but was discontinued because he has been noncompliance, CAD s/p CABG 2014, s/p IVCD, HTN, DM, presents to the ER with nonproductive coughs, wheezing, and shortness of breath. He denied fever or chills. Evalaution in the ER included a CXR which showed improved right basilar density, though concerns were raised for interstitial edema, elevated BNP to 6K, with normal Cr and Hb was 11 grams per dL. He was given Lasix IV, and hospitalist was asked to admit him for CHF   Hospital Course:  Acute on chronic combined systolic and diastolic heart failure  - Continue with Lasix, but change to oral. Increase metoprolol to 75 mg twice a day  - Suspect decompensation secondary to atrial fibrillation with rapid ventricular response  - Appreciate cardiology evaluation   Atrial fibrillation with rapid ventricular response  - Increase metoprolol to 75 mg twice  a day   - Cardiology recommending Xarelto-have asked case management to assist with medications.  - Continue to monitor in telemetry   Shortness of breath  - Multifactorial, suspect secondary to acute congestive heart failure, atrial fibrillation with a ventricular response, and acute bronchitis  -  started Levaquin. Continue metoprolol for rate control.  - Thankfully not hypoxic.   History of CAD status post CABG  - Cardiac enzymes negative  - Stop aspirin, as patient will be on Xarelto.  - Start statin, continue beta blockers   History of ischemic dilated cardiomyopathy  - Status post AICD placement   History of diabetes  - A1c in October/2014- 6.8, non-compliance with medication  - Recheck A1c on 1/12 8.2 -add metformin  Hypertension  - On Lopressor, continue to follow BP treatment and adjust medications accordingly   Noncompliance to medications/followup  - Have counseled extensively- [facility] has been arranged for 1/22   Procedures:  Echo- EF 25 %  Consultations:  cards  Discharge Exam: Filed Vitals:   04/07/13 0439  BP: 108/79  Pulse: 104  Temp: 100.3 F (37.9 C)  Resp: 20    General: pleasant/cooperative Cardiovascular: rrr Respiratory: clear anterior  Discharge Instructions      Discharge Orders   Future Appointments Provider Department Dept Phone   04/15/2013 11:00 AM [facility] Covering Provider [facility] [phone]   Future Orders Complete By Expires   (HEART FAILURE PATIENTS) Call MD:  Anytime you have any of the following  symptoms: 1) 3 pound weight gain in 24 hours or 5 pounds in 1 week 2) shortness of breath, with or without a dry hacking cough 3) swelling in the hands, feet or stomach 4) if you have to sleep on extra pillows at night in order to breathe.  As directed    Diet - low sodium heart healthy  As directed    Diet Carb Modified  As directed    Discharge instructions   As directed    Comments:     Fluid restrict to 1.2 L/day   Increase activity slowly  As directed        Medication List         atorvastatin 40 MG tablet  Commonly known as:  LIPITOR  Take 1 tablet (40 mg total) by mouth daily at 6 PM.     levofloxacin 750 MG tablet  Commonly known as:  LEVAQUIN  Take 1 tablet (750 mg total) by mouth daily.     metFORMIN 500 MG tablet  Commonly known as:  GLUCOPHAGE  Take 1 tablet (500 mg total) by mouth 2 (two) times daily with a meal.     metoprolol tartrate 25 MG tablet  Commonly known as:  LOPRESSOR  Take 3 tablets (75 mg total) by mouth 2 (two) times daily.     Rivaroxaban 20 MG Tabs tablet  Commonly known as:  XARELTO  Take 1 tablet (20 mg total) by mouth daily with supper.       No Known Allergies Follow-up Information   Follow up with [facility]     On 04/15/2013. (11:00; please bring photo ID and all medications you are currently taking.  )    Contact information:   [address] [name] [address] [phone]       The results of significant diagnostics from this hospitalization (including imaging, microbiology, ancillary and laboratory) are listed below for reference.    Significant Diagnostic Studies: Dg Chest 2 View  04/04/2013   CLINICAL DATA:  Cough, fever.  EXAM: CHEST  2 VIEW  COMPARISON:  February 11, 2013.  FINDINGS: Stable cardiomediastinal silhouette. Status post coronary artery bypass graft. Left-sided pacemaker is unchanged in position. Stable mild central pulmonary vascular congestion is noted. Mild right pleural effusion is noted which is improved compared to prior exam. Right basilar opacity is improved compared to prior exam, although residual interstitial density remains concerning for edema or subsegmental atelectasis.  IMPRESSION: Right basilar opacity is improved compared to prior exam. Mild right pleural effusion is noted.   Electronically Signed   By: Roque Lias M.D.   On: 04/04/2013 01:08    Microbiology: No results found for this or any previous visit (from the past 240 hour(s)).   Labs: Basic Metabolic Panel:  Recent Labs Lab 04/04/13 0319 04/04/13 1418 04/05/13 0503 04/05/13 1110 04/06/13 0500 04/07/13 0420  NA 134*  --  131* 133* 131* 131*  K 4.8  --  4.1 4.0 3.9 4.5  CL 95*  --  91* 92* 89* 90*  CO2 28  --  27 29 31  34*  GLUCOSE 194*  --  189* 217* 232* 157*  BUN 28*  --  28* 27* 27* 30*  CREATININE 1.17 1.11 1.17 1.08 1.18 1.32  CALCIUM 8.2*  --  8.0* 8.2* 7.9* 8.0*  MG  --   --   --   --   --  1.6   Liver Function Tests:  Recent  Labs Lab 04/07/13 0420  AST 14  ALT 52  ALKPHOS 194*  BILITOT 1.0  PROT 6.4  ALBUMIN 2.5*   No results found for this basename: LIPASE, AMYLASE,  in the last 168 hours No results found for this basename: AMMONIA,  in the last 168 hours CBC:  Recent Labs Lab 04/04/13 0319 04/04/13 1418 04/05/13 0503 04/07/13 0420  WBC 11.2* 11.0* 10.1 10.5  NEUTROABS 8.8*  --   --   --   HGB 11.0* 10.9* 9.7* 10.6*  HCT 37.8* 37.9* 33.6* 37.2*  MCV 68.0* 67.9* 66.3* 67.8*  PLT 335 297 268 290   Cardiac Enzymes:  Recent Labs Lab 04/04/13 0320 04/04/13 1418 04/04/13 2030 04/05/13 0503  TROPONINI <0.30 <0.30 <0.30 <0.30   BNP: BNP (last 3 results)  Recent Labs  02/11/13 1203 04/04/13 0320 04/07/13 0420  PROBNP 6486.0* 6119.0* 3282.0*   CBG:  Recent Labs Lab 04/06/13 0605 04/06/13 1112 04/06/13 1628 04/06/13 2112 04/07/13 0614  GLUCAP 214* 137* 184* 150* 249*       Signed:  Benjamine Mola, JESSICA  [facility] 04/07/2013, 11:36 AM

## 2013-04-08 NOTE — Telephone Encounter (Signed)
TCM - Line busy.

## 2013-04-08 NOTE — Telephone Encounter (Signed)
LMTCB 1/15@11 :55 am - PE

## 2013-04-12 NOTE — Telephone Encounter (Signed)
Lm on machine to call back.  Advised of app 1/21 w/Michelle Lenze at 8:15

## 2013-04-13 NOTE — Telephone Encounter (Signed)
Lm on machine to return call post hospital discharge

## 2013-04-14 ENCOUNTER — Encounter: Payer: Medicaid - Out of State | Admitting: Physician Assistant

## 2013-04-15 ENCOUNTER — Inpatient Hospital Stay: Payer: Medicaid - Out of State | Admitting: Internal Medicine

## 2013-04-21 ENCOUNTER — Inpatient Hospital Stay: Payer: Medicaid - Out of State | Admitting: Cardiology

## 2013-04-23 ENCOUNTER — Encounter: Payer: Self-pay | Admitting: Physician Assistant

## 2013-05-12 ENCOUNTER — Encounter (HOSPITAL_COMMUNITY): Payer: Self-pay | Admitting: Emergency Medicine

## 2013-05-12 ENCOUNTER — Emergency Department (HOSPITAL_COMMUNITY): Payer: Medicaid - Out of State

## 2013-05-12 ENCOUNTER — Inpatient Hospital Stay (HOSPITAL_COMMUNITY)
Admission: EM | Admit: 2013-05-12 | Discharge: 2013-05-19 | DRG: 291 | Disposition: A | Discharge status: Home / Self Care | Payer: Medicaid - Out of State | Attending: Internal Medicine | Admitting: Internal Medicine

## 2013-05-12 DIAGNOSIS — E860 Dehydration: Secondary | ICD-10-CM | POA: Diagnosis present

## 2013-05-12 DIAGNOSIS — Z638 Other specified problems related to primary support group: Secondary | ICD-10-CM

## 2013-05-12 DIAGNOSIS — Z951 Presence of aortocoronary bypass graft: Secondary | ICD-10-CM

## 2013-05-12 DIAGNOSIS — D509 Iron deficiency anemia, unspecified: Secondary | ICD-10-CM

## 2013-05-12 DIAGNOSIS — I2510 Atherosclerotic heart disease of native coronary artery without angina pectoris: Secondary | ICD-10-CM

## 2013-05-12 DIAGNOSIS — K7689 Other specified diseases of liver: Secondary | ICD-10-CM | POA: Diagnosis present

## 2013-05-12 DIAGNOSIS — Z9119 Patient's noncompliance with other medical treatment and regimen: Secondary | ICD-10-CM

## 2013-05-12 DIAGNOSIS — E119 Type 2 diabetes mellitus without complications: Secondary | ICD-10-CM

## 2013-05-12 DIAGNOSIS — I5043 Acute on chronic combined systolic (congestive) and diastolic (congestive) heart failure: Secondary | ICD-10-CM

## 2013-05-12 DIAGNOSIS — I10 Essential (primary) hypertension: Secondary | ICD-10-CM

## 2013-05-12 DIAGNOSIS — J189 Pneumonia, unspecified organism: Secondary | ICD-10-CM

## 2013-05-12 DIAGNOSIS — Z5900 Homelessness unspecified: Secondary | ICD-10-CM

## 2013-05-12 DIAGNOSIS — I509 Heart failure, unspecified: Secondary | ICD-10-CM | POA: Diagnosis present

## 2013-05-12 DIAGNOSIS — I4891 Unspecified atrial fibrillation: Secondary | ICD-10-CM

## 2013-05-12 DIAGNOSIS — Z720 Tobacco use: Secondary | ICD-10-CM

## 2013-05-12 DIAGNOSIS — Z833 Family history of diabetes mellitus: Secondary | ICD-10-CM

## 2013-05-12 DIAGNOSIS — I5023 Acute on chronic systolic (congestive) heart failure: Principal | ICD-10-CM

## 2013-05-12 DIAGNOSIS — N179 Acute kidney failure, unspecified: Secondary | ICD-10-CM

## 2013-05-12 DIAGNOSIS — I482 Chronic atrial fibrillation, unspecified: Secondary | ICD-10-CM

## 2013-05-12 DIAGNOSIS — F17200 Nicotine dependence, unspecified, uncomplicated: Secondary | ICD-10-CM | POA: Diagnosis present

## 2013-05-12 DIAGNOSIS — R791 Abnormal coagulation profile: Secondary | ICD-10-CM

## 2013-05-12 DIAGNOSIS — Z95810 Presence of automatic (implantable) cardiac defibrillator: Secondary | ICD-10-CM

## 2013-05-12 DIAGNOSIS — Z91199 Patient's noncompliance with other medical treatment and regimen due to unspecified reason: Secondary | ICD-10-CM

## 2013-05-12 DIAGNOSIS — I5022 Chronic systolic (congestive) heart failure: Secondary | ICD-10-CM

## 2013-05-12 DIAGNOSIS — R0789 Other chest pain: Secondary | ICD-10-CM

## 2013-05-12 LAB — POCT I-STAT, CHEM 8
BUN: 39 mg/dL — ABNORMAL HIGH (ref 6–23)
CALCIUM ION: 1.1 mmol/L — AB (ref 1.12–1.23)
Chloride: 96 mEq/L (ref 96–112)
Creatinine, Ser: 1.5 mg/dL — ABNORMAL HIGH (ref 0.50–1.35)
Glucose, Bld: 234 mg/dL — ABNORMAL HIGH (ref 70–99)
HEMATOCRIT: 42 % (ref 39.0–52.0)
Hemoglobin: 14.3 g/dL (ref 13.0–17.0)
POTASSIUM: 4.2 meq/L (ref 3.7–5.3)
Sodium: 136 mEq/L — ABNORMAL LOW (ref 137–147)
TCO2: 24 mmol/L (ref 0–100)

## 2013-05-12 LAB — POCT I-STAT TROPONIN I: TROPONIN I, POC: 0.05 ng/mL (ref 0.00–0.08)

## 2013-05-12 NOTE — ED Notes (Signed)
Patient arrived via [facility] from a parking lot at a [employer] [place] with chest pain. Patient states he felt dizzy and then had chest pain that is described as sharp, denies N/V, shortness of breath. EMS administered Asprin 324mg , Ntg 3x with decrease in pain. EMS EKG showed afib with HR 114-145.

## 2013-05-13 DIAGNOSIS — I4891 Unspecified atrial fibrillation: Secondary | ICD-10-CM | POA: Diagnosis present

## 2013-05-13 DIAGNOSIS — J189 Pneumonia, unspecified organism: Secondary | ICD-10-CM | POA: Diagnosis present

## 2013-05-13 DIAGNOSIS — R791 Abnormal coagulation profile: Secondary | ICD-10-CM | POA: Diagnosis present

## 2013-05-13 DIAGNOSIS — I5022 Chronic systolic (congestive) heart failure: Secondary | ICD-10-CM

## 2013-05-13 DIAGNOSIS — Z720 Tobacco use: Secondary | ICD-10-CM | POA: Diagnosis present

## 2013-05-13 DIAGNOSIS — I509 Heart failure, unspecified: Secondary | ICD-10-CM

## 2013-05-13 LAB — HEPARIN LEVEL (UNFRACTIONATED): Heparin Unfractionated: <0.1 IU/mL — ABNORMAL LOW (ref 0.30–0.70)

## 2013-05-13 LAB — CBC WITH DIFFERENTIAL/PLATELET
BASOS PCT: 0 % (ref 0–1)
Basophils Absolute: 0 10*3/uL (ref 0.0–0.1)
EOS PCT: 0 % (ref 0–5)
Eosinophils Absolute: 0 10*3/uL (ref 0.0–0.7)
HCT: 38 % — ABNORMAL LOW (ref 39.0–52.0)
HEMOGLOBIN: 11.1 g/dL — AB (ref 13.0–17.0)
LYMPHS PCT: 20 % (ref 12–46)
Lymphs Abs: 1.6 10*3/uL (ref 0.7–4.0)
MCH: 20.1 pg — AB (ref 26.0–34.0)
MCHC: 29.2 g/dL — ABNORMAL LOW (ref 30.0–36.0)
MCV: 69 fL — ABNORMAL LOW (ref 78.0–100.0)
Monocytes Absolute: 1 10*3/uL (ref 0.1–1.0)
Monocytes Relative: 13 % — ABNORMAL HIGH (ref 3–12)
NEUTROS PCT: 67 % (ref 43–77)
Neutro Abs: 5.2 10*3/uL (ref 1.7–7.7)
PLATELETS: 254 10*3/uL (ref 150–400)
RBC: 5.51 MIL/uL (ref 4.22–5.81)
RDW: 22.2 % — ABNORMAL HIGH (ref 11.5–15.5)
WBC: 7.8 10*3/uL (ref 4.0–10.5)

## 2013-05-13 LAB — HEPATIC FUNCTION PANEL
ALK PHOS: 160 U/L — AB (ref 39–117)
ALT: 76 U/L — AB (ref 0–53)
AST: 79 U/L — ABNORMAL HIGH (ref 0–37)
Albumin: 3.1 g/dL — ABNORMAL LOW (ref 3.5–5.2)
BILIRUBIN INDIRECT: 0.9 mg/dL (ref 0.3–0.9)
BILIRUBIN TOTAL: 1.9 mg/dL — AB (ref 0.3–1.2)
Bilirubin, Direct: 1 mg/dL — ABNORMAL HIGH (ref 0.0–0.3)
Total Protein: 7.1 g/dL (ref 6.0–8.3)

## 2013-05-13 LAB — MRSA PCR SCREENING: MRSA by PCR: NEGATIVE

## 2013-05-13 LAB — GLUCOSE, CAPILLARY
GLUCOSE-CAPILLARY: 132 mg/dL — AB (ref 70–99)
Glucose-Capillary: 153 mg/dL — ABNORMAL HIGH (ref 70–99)
Glucose-Capillary: 264 mg/dL — ABNORMAL HIGH (ref 70–99)
Glucose-Capillary: 122 mg/dL — ABNORMAL HIGH (ref 70–99)

## 2013-05-13 LAB — HIV ANTIBODY (ROUTINE TESTING W REFLEX): HIV: NONREACTIVE

## 2013-05-13 LAB — PROTIME-INR
INR: 1.83 — ABNORMAL HIGH (ref 0.00–1.49)
INR: 1.97 — AB (ref 0.00–1.49)
PROTHROMBIN TIME: 21.8 s — AB (ref 11.6–15.2)
Prothrombin Time: 20.6 seconds — ABNORMAL HIGH (ref 11.6–15.2)

## 2013-05-13 LAB — PRO B NATRIURETIC PEPTIDE: Pro B Natriuretic peptide (BNP): 2935 pg/mL — ABNORMAL HIGH (ref 0–125)

## 2013-05-13 MED ORDER — DEXTROSE 5 % IV SOLN
1.0000 g | Freq: Three times a day (TID) | INTRAVENOUS | Status: DC
  Administered 2013-05-13 – 2013-05-15 (×7): 1 g via INTRAVENOUS
  Filled 2013-05-13 (×10): qty 1

## 2013-05-13 MED ORDER — DILTIAZEM HCL 25 MG/5ML IV SOLN
5.0000 mg | Freq: Once | INTRAVENOUS | Status: AC
  Administered 2013-05-13: 5 mg via INTRAVENOUS
  Filled 2013-05-13: qty 5

## 2013-05-13 MED ORDER — RIVAROXABAN 20 MG PO TABS: 20.0000 mg | ORAL_TABLET | Freq: Every day | ORAL | Status: DC

## 2013-05-13 MED ORDER — DILTIAZEM HCL 100 MG IV SOLR
5.0000 mg/h | Freq: Once | INTRAVENOUS | Status: AC
  Administered 2013-05-13: 5 mg/h via INTRAVENOUS

## 2013-05-13 MED ORDER — LEVALBUTEROL HCL 0.63 MG/3ML IN NEBU: 0.6300 mg | INHALATION_SOLUTION | Freq: Four times a day (QID) | RESPIRATORY_TRACT | Status: DC | PRN

## 2013-05-13 MED ORDER — METOPROLOL TARTRATE 50 MG PO TABS
75.0000 mg | ORAL_TABLET | Freq: Two times a day (BID) | ORAL | Status: DC
  Administered 2013-05-13 – 2013-05-15 (×5): 75 mg via ORAL
  Filled 2013-05-13 (×6): qty 1

## 2013-05-13 MED ORDER — ACETAMINOPHEN 325 MG PO TABS
650.0000 mg | ORAL_TABLET | Freq: Four times a day (QID) | ORAL | Status: DC | PRN
  Administered 2013-05-13: 650 mg via ORAL
  Filled 2013-05-13: qty 2

## 2013-05-13 MED ORDER — INSULIN ASPART 100 UNIT/ML ~~LOC~~ SOLN
0.0000 [IU] | Freq: Three times a day (TID) | SUBCUTANEOUS | Status: DC
  Administered 2013-05-13: 5 [IU] via SUBCUTANEOUS
  Administered 2013-05-13 (×2): 2 [IU] via SUBCUTANEOUS
  Administered 2013-05-14: 3 [IU] via SUBCUTANEOUS
  Administered 2013-05-14: 2 [IU] via SUBCUTANEOUS
  Administered 2013-05-15: 1 [IU] via SUBCUTANEOUS
  Administered 2013-05-15: 3 [IU] via SUBCUTANEOUS
  Administered 2013-05-15 – 2013-05-16 (×3): 2 [IU] via SUBCUTANEOUS

## 2013-05-13 MED ORDER — INSULIN ASPART 100 UNIT/ML ~~LOC~~ SOLN: 0.0000 [IU] | Freq: Three times a day (TID) | SUBCUTANEOUS | Status: DC

## 2013-05-13 MED ORDER — METOPROLOL TARTRATE 25 MG PO TABS: 75.0000 mg | ORAL_TABLET | Freq: Two times a day (BID) | ORAL | Status: DC

## 2013-05-13 MED ORDER — METFORMIN HCL 500 MG PO TABS: 500.0000 mg | ORAL_TABLET | Freq: Two times a day (BID) | ORAL | Status: DC

## 2013-05-13 MED ORDER — DILTIAZEM HCL 100 MG IV SOLR
5.0000 mg/h | INTRAVENOUS | Status: DC
  Administered 2013-05-13: 5 mg/h via INTRAVENOUS
  Filled 2013-05-13: qty 100

## 2013-05-13 MED ORDER — PIPERACILLIN-TAZOBACTAM 3.375 G IVPB 30 MIN
3.3750 g | Freq: Once | INTRAVENOUS | Status: AC
  Administered 2013-05-13: 3.375 g via INTRAVENOUS
  Filled 2013-05-13: qty 50

## 2013-05-13 MED ORDER — SODIUM CHLORIDE 0.9 % IV SOLN
INTRAVENOUS | Status: DC
  Administered 2013-05-13: 02:00:00 via INTRAVENOUS

## 2013-05-13 MED ORDER — FUROSEMIDE 20 MG PO TABS
20.0000 mg | ORAL_TABLET | Freq: Every day | ORAL | Status: DC
  Administered 2013-05-13: 20 mg via ORAL
  Filled 2013-05-13 (×2): qty 1

## 2013-05-13 MED ORDER — VANCOMYCIN HCL IN DEXTROSE 1-5 GM/200ML-% IV SOLN
1000.0000 mg | Freq: Once | INTRAVENOUS | Status: AC
  Administered 2013-05-13: 1000 mg via INTRAVENOUS
  Filled 2013-05-13: qty 200

## 2013-05-13 MED ORDER — INSULIN ASPART 100 UNIT/ML ~~LOC~~ SOLN: 0.0000 [IU] | Freq: Every day | SUBCUTANEOUS | Status: DC

## 2013-05-13 MED ORDER — RIVAROXABAN 20 MG PO TABS
20.0000 mg | ORAL_TABLET | Freq: Every day | ORAL | Status: DC
  Administered 2013-05-13 – 2013-05-14 (×2): 20 mg via ORAL
  Filled 2013-05-13 (×3): qty 1

## 2013-05-13 MED ORDER — VANCOMYCIN HCL IN DEXTROSE 750-5 MG/150ML-% IV SOLN
750.0000 mg | Freq: Two times a day (BID) | INTRAVENOUS | Status: DC
  Administered 2013-05-13 – 2013-05-15 (×5): 750 mg via INTRAVENOUS
  Filled 2013-05-13 (×5): qty 150

## 2013-05-13 MED ORDER — ATORVASTATIN CALCIUM 40 MG PO TABS
40.0000 mg | ORAL_TABLET | Freq: Every day | ORAL | Status: DC
  Administered 2013-05-13 – 2013-05-19 (×7): 40 mg via ORAL
  Filled 2013-05-13 (×7): qty 1

## 2013-05-13 NOTE — Progress Notes (Signed)
[facility] PROGRESS NOTE  Jacob [name] GBT:[id number] DOB: 01/27/1956 DOA: 05/12/2013 PCP: No PCP Per Patient  Assessment/Plan: Principal Problem:   HCAP (healthcare-associated pneumonia): On vancomycin and cefepime  Active Problems:   Chronic systolic congestive heart failure: Interestingly, patient never did make followup appointment and therefore did not restart Lasix, however BNP only mildly elevated at 2935 in the setting of rapid atrial fibrillation and healthcare associated pneumonia. Restarted Lasix at 20 mg by mouth and we'll measure output.    Type 2 diabetes mellitus not at goal: Checking A1c again. Patient did not fill prescription for metformin. However, question metformin would be the best medication patient with poor ejection fraction. For now on sliding-scale and may benefit from low-dose glipizide.   Noncompliance: Patient remains noncompliant. He did not take any of his medications. He is able to financially afford his cigarettes. Will need to have extensive discussions with this patient.    Atrial fibrillation with RVR: Currently on cardizen gtt.  restarted beta blocker in hopes to wean off of drip    Elevated INR: Unclear etiology. Discussed with pharmacy. INR can be elevated in patient on Xarelto, however patient has not taken Xarelto since last discharge 5 weeks ago. He is not on Coumadin. Even in the setting of heart failure with hepatic congestion, this would not necessarily explain this. We'll need to further discuss and research patient's drinking and may need to get right upper quadrant ultrasound.   acute renal failure: Slightly elevated, likely from dehydration versus decreased perfusion from congestive heart failure. I do not think patient has been taking Lasix. We'll discontinue IV fluids and start low-dose Lasix  Tobacco abuse: Will offer nicotine patch  Code Status: Full code Family Communication: Left message with Wonda Cheng (?pt's brother) Disposition  Plan: Try to get weaned off of Cardizem drip, then transfer to floor   Consultants:  None  Procedures:  None  Antibiotics:  IV vancomycin and Zosyn, day 1  HPI/Subjective: Patient seen down in step down unit. Feeling a little bit better. Denies any chest pain or palpitations. No cough.  Objective: Filed Vitals:   05/13/13 0900  BP: 121/87  Pulse: 109  Temp:   Resp: 21    Intake/Output Summary (Last 24 hours) at 05/13/13 1016 Last data filed at 05/13/13 0900  Gross per 24 hour  Intake 533.58 ml  Output      0 ml  Net 533.58 ml   Filed Weights   05/12/13 2257 05/13/13 0205  Weight: 85.73 kg (189 lb) 95.1 kg (209 lb 10.5 oz)    Exam:   General:  Alert and oriented x3, fatigue, no acute distress  Cardiovascular: Irregular rhythm, mild tachycardia  Respiratory: Decreased breath sounds bibasilar, otherwise clear to auscultation bilaterally  Abdomen: Soft, nontender, nondistended, positive bowel sounds  Musculoskeletal: No clubbing or cyanosis, trace edema   Data Reviewed: Basic Metabolic Panel:  Recent Labs Lab 05/12/13 2347  NA 136*  K 4.2  CL 96  GLUCOSE 234*  BUN 39*  CREATININE 1.50*   Liver Function Tests:  Recent Labs Lab 05/12/13 2342  AST 79*  ALT 76*  ALKPHOS 160*  BILITOT 1.9*  PROT 7.1  ALBUMIN 3.1*   CBC:  Recent Labs Lab 05/12/13 2342 05/12/13 2347  WBC 7.8  --   NEUTROABS 5.2  --   HGB 11.1* 14.3  HCT 38.0* 42.0  MCV 69.0*  --   PLT 254  --    Cardiac Enzymes: No results found for this basename: CKTOTAL,  CKMB, CKMBINDEX, TROPONINI,  in the last 168 hours BNP (last 3 results)  Recent Labs  04/04/13 0320 04/07/13 0420 05/13/13 0758  PROBNP 6119.0* 3282.0* 2935.0*   CBG:  Recent Labs Lab 05/13/13 0721  GLUCAP 153*    Recent Results (from the past 240 hour(s))  MRSA PCR SCREENING     Status: None   Collection Time    05/13/13  2:14 AM      Result Value Ref Range Status   MRSA by PCR NEGATIVE   NEGATIVE Final   Comment:            The GeneXpert MRSA Assay (FDA     approved for NASAL specimens     only), is one component of a     comprehensive MRSA colonization     surveillance program. It is not     intended to diagnose MRSA     infection nor to guide or     monitor treatment for     MRSA infections.     Studies: Dg Chest 2 View  05/12/2013   CLINICAL DATA:  Chest pain  EXAM: CHEST  2 VIEW  COMPARISON:  04/04/2013  FINDINGS: Consolidation noted in the right middle lobe concerning for pneumonia. This has worsened since prior study. Small right pleural effusion. Left lung is clear. Her left pacer is in place, unchanged. Stable cardiomegaly.  IMPRESSION: Worsening right middle lobe airspace opacity concerning for worsening pneumonia.  Small right pleural effusion.   Electronically Signed   By: Charlett NoseKevin  Dover M.D.   On: 05/12/2013 23:44    Scheduled Meds: . atorvastatin  40 mg Oral q1800  . ceFEPime (MAXIPIME) IV  1 g Intravenous 3 times per day  . furosemide  20 mg Oral Daily  . insulin aspart  0-5 Units Subcutaneous QHS  . insulin aspart  0-9 Units Subcutaneous TID WC  . metoprolol tartrate  75 mg Oral BID  . vancomycin  750 mg Intravenous Q12H   Continuous Infusions: . diltiazem (CARDIZEM) infusion 5 mg/hr (05/13/13 0604)    Principal Problem:   HCAP (healthcare-associated pneumonia) Active Problems:   Chronic systolic congestive heart failure   Type 2 diabetes mellitus not at goal   Noncompliance   Atrial fibrillation with RVR   Elevated INR  acute renal failure   Time spent: 40 minutes    Hollice EspyKRISHNAN,Elmon Shader K  [facility] Pager [phone]. If 7PM-7AM, please contact night-coverage at [email or web address], password [email or web address] 05/13/2013, 10:16 AM  LOS: 1 day

## 2013-05-13 NOTE — Progress Notes (Signed)
ANTIBIOTIC CONSULT NOTE - INITIAL  Pharmacy Consult for vancomycin Indication: rule out pneumonia  No Known Allergies  Patient Measurements: Height: 5\' 7"  (170.2 cm) Weight: 189 lb (85.73 kg) IBW/kg (Calculated) : 66.1  Vital Signs: Temp: 97.8 F (36.6 C) (02/18 2257) Temp src: Oral (02/18 2257) BP: 141/95 mmHg (02/18 2300) Pulse Rate: 135 (02/18 2300)  Labs:  Recent Labs  05/12/13 2342 05/12/13 2347  WBC 7.8  --   HGB 11.1* 14.3  PLT 254  --   CREATININE  --  1.50*   Estimated Creatinine Clearance: 57.5 ml/min (by C-G formula based on Cr of 1.5).   Microbiology: No results found for this or any previous visit (from the past 720 hour(s)).  Medical History: Past Medical History  Diagnosis Date  . Diabetes mellitus   . Hypertension   . Systolic heart failure     EF is 40-45% by echo, December 2013  . Noncompliance   . CAD (coronary artery disease) Sept 2013    s/p cardiac cath showing occlusion of small RCA with collaterals  . Chronic anticoagulation     on coumadin  . Atrial fibrillation     Assessment: 58yo male c/o sharp CP, denies SOB, found to be in Afib, known h/o Afib and on Xarelto but known to be noncompliant with meds, CXR concerning for worsening PNA, to begin IV ABX.  Goal of Therapy:  Vancomycin trough level 15-20 mcg/ml  Plan:  Rec'd vanc 1g in ED; will continue with vancomycin 750mg  IV Q12H and monitor CBC, Cx, levels prn.  Vernard Gambles, PharmD, BCPS  05/13/2013,12:46 AM

## 2013-05-13 NOTE — Progress Notes (Signed)
Utilization Review Completed.  

## 2013-05-13 NOTE — Progress Notes (Signed)
ANTICOAGULATION CONSULT NOTE - Initial Consult  Pharmacy Consult for Xarelto Indication: atrial fibrillation  No Known Allergies  Patient Measurements: Height: 5\' 7"  (170.2 cm) Weight: 209 lb 10.5 oz (95.1 kg) IBW/kg (Calculated) : 66.1  Vital Signs: Temp: 97.9 F (36.6 C) (02/19 1200) Temp src: Oral (02/19 1200) BP: 128/97 mmHg (02/19 1200) Pulse Rate: 106 (02/19 1200)  Labs:  Recent Labs  05/12/13 2342 05/12/13 2347 05/13/13 0605  HGB 11.1* 14.3  --   HCT 38.0* 42.0  --   PLT 254  --   --   LABPROT 21.8*  --  20.6*  INR 1.97*  --  1.83*  HEPARINUNFRC <0.10*  --   --   CREATININE  --  1.50*  --     Estimated Creatinine Clearance: 60.4 ml/min (by C-G formula based on Cr of 1.5).   Medical History: Past Medical History  Diagnosis Date  . Diabetes mellitus   . Hypertension   . Systolic heart failure     EF is 40-45% by echo, December 2013  . Noncompliance   . CAD (coronary artery disease) Sept 2013    s/p cardiac cath showing occlusion of small RCA with collaterals  . Chronic anticoagulation     on coumadin  . Atrial fibrillation     Medications:  Noncompliant with medications  Assessment: 58 year old male with history of afib on chronic coumadin and then recently switched to Xarelto. Through multiple conversations it has been established that he is not taking any of his medications including warfarin or xarelto. He continues to be in afib, orders to resume his xarelto today. Of note his INR is continues to be elevated this am for unknown cause. Will continue to monitor closely.   Goal of Therapy:  Monitor platelets by anticoagulation protocol: Yes   Plan:  Xarelto 20mg  daily Follow up elevated INR workup  Sheppard Coil PharmD., BCPS Clinical Pharmacist Pager [phone] 05/13/2013 12:17 PM

## 2013-05-13 NOTE — ED Provider Notes (Signed)
CSN: [id number]     Arrival date & time 05/12/13  2244 History   First MD Initiated Contact with Patient 05/12/13 2305     Chief Complaint  Patient presents with  . Chest Pain     (Consider location/radiation/quality/duration/timing/severity/associated sxs/prior Treatment) Patient is a 58 y.o. male presenting with chest pain. Jacob history is provided by Jacob patient. No language interpreter was used.  Chest Pain Pain quality: sharp   Pain radiates to:  Does not radiate Pain radiates to Jacob back: no   Pain severity:  Moderate Duration:  2 days Timing:  Intermittent Progression:  Worsening Chronicity:  Recurrent Context: not lifting   Associated symptoms: cough, palpitations and shortness of breath   Associated symptoms: no abdominal pain, no fever, no nausea and not vomiting   Risk factors: coronary artery disease    58 yo male with a PMH of Diabetes Mellitus, Hypertension, Systolic Heart Failure, CAD, Afib, s/p CABG, s/p cardioverter defibrillator implant who presents with chief complaint of chest pain. Jacob chest pain initially started 2 days ago and lasted for approximately 1 hour. Today Jacob chest pain started at 4 pm and went away, only to return at 7:30 pm. He describes Jacob pain as retrosternal, 6/10, without any radiation to jaw or arm. Additionally he describes feeling SOB, dizzy, and pain when breathing. Patient denies nausea, vomiting, diarrhea, and fever.   Past Medical History  Diagnosis Date  . Diabetes mellitus   . Hypertension   . Systolic heart failure     EF is 40-45% by echo, December 2013  . Noncompliance   . CAD (coronary artery disease) Sept 2013    s/p cardiac cath showing occlusion of small RCA with collaterals  . Chronic anticoagulation     on coumadin  . Atrial fibrillation    Past Surgical History  Procedure Laterality Date  . S/p cardiac cath  Sept 2013  . Coronary artery bypass graft      2 vessels per patient Jacob [name])  July 2014  . Implantable  cardioverter defibrillator implant      [place] in 07/2012; [employer]   Family History  Problem Relation Age of Onset  . Diabetes Mother    History  Substance Use Topics  . Smoking status: Current Every Day Smoker -- 0.50 packs/day for 20 years    Types: Cigarettes  . Smokeless tobacco: Never Used     Comment: Still with 3 - 4 per day.   . Alcohol Use: No    Review of Systems  Constitutional: Negative for fever.  Respiratory: Positive for cough, shortness of breath and wheezing.   Cardiovascular: Positive for chest pain and palpitations.  Gastrointestinal: Negative for nausea, vomiting, abdominal pain and diarrhea.  All other systems reviewed and are negative.      Allergies  Review of patient's allergies indicates no known allergies.  Home Medications   Current Outpatient Rx  Name  Route  Sig  Dispense  Refill  . atorvastatin (LIPITOR) 40 MG tablet   Oral   Take 1 tablet (40 mg total) by mouth daily at 6 PM.   30 tablet   0   . levofloxacin (LEVAQUIN) 750 MG tablet   Oral   Take 1 tablet (750 mg total) by mouth daily.   4 tablet   0   . metFORMIN (GLUCOPHAGE) 500 MG tablet   Oral   Take 1 tablet (500 mg total) by mouth 2 (two) times daily with a meal.   60 tablet  0   . metoprolol tartrate (LOPRESSOR) 25 MG tablet   Oral   Take 3 tablets (75 mg total) by mouth 2 (two) times daily.   180 tablet   0   . Rivaroxaban (XARELTO) 20 MG TABS tablet   Oral   Take 1 tablet (20 mg total) by mouth daily with supper.   30 tablet   0    BP 141/95  Pulse 135  Temp(Src) 97.8 F (36.6 C) (Oral)  Resp 24  Ht 5\' 7"  (1.702 m)  Wt 189 lb (85.73 kg)  BMI 29.59 kg/m2  SpO2 99% Physical Exam  Constitutional: He is oriented to person, place, and time. He appears well-developed and well-nourished. He appears distressed.  HENT:  Head: Normocephalic and atraumatic.  Mouth/Throat: No oropharyngeal exudate.  Eyes: Pupils are equal, round, and reactive to  light.  Neck: Normal range of motion. Neck supple.  Cardiovascular: Normal rate and intact distal pulses.  An irregularly irregular rhythm present. Exam reveals no friction rub.   No murmur heard. Pulmonary/Chest: He has decreased breath sounds. He has no rales. He exhibits no tenderness.  Abdominal: Soft. Bowel sounds are normal. He exhibits no distension. There is no tenderness. There is no rebound and no guarding.  Musculoskeletal: Normal range of motion.  Neurological: He is alert and oriented to person, place, and time.  Skin: Skin is warm and dry.  Psychiatric: He has a normal mood and affect.    ED Course  Procedures (including critical care time) Labs Review Labs Reviewed  CBC WITH DIFFERENTIAL - Abnormal; Notable for Jacob following:    Hemoglobin 11.1 (*)    HCT 38.0 (*)    MCV 69.0 (*)    MCH 20.1 (*)    MCHC 29.2 (*)    RDW 22.2 (*)    All other components within normal limits  POCT I-STAT, CHEM 8 - Abnormal; Notable for Jacob following:    Sodium 136 (*)    BUN 39 (*)    Creatinine, Ser 1.50 (*)    Glucose, Bld 234 (*)    Calcium, Ion 1.10 (*)    All other components within normal limits  CULTURE, BLOOD (ROUTINE X 2)  CULTURE, BLOOD (ROUTINE X 2)  PROTIME-INR  POCT I-STAT TROPONIN I   Imaging Review Dg Chest 2 View  05/12/2013   CLINICAL DATA:  Chest pain  EXAM: CHEST  2 VIEW  COMPARISON:  04/04/2013  FINDINGS: Consolidation noted in Jacob right middle lobe concerning for pneumonia. This has worsened since prior study. Small right pleural effusion. Left lung is clear. Her left pacer is in place, unchanged. Stable cardiomegaly.  IMPRESSION: Worsening right middle lobe airspace opacity concerning for worsening pneumonia.  Small right pleural effusion.   Electronically Signed   By: Jacob NoseKevin  Dover M.D.   On: 05/12/2013 23:44    EKG Interpretation    Date/Time:  Wednesday May 12 2013 22:51:09 EST Ventricular Rate:  127 PR Interval:    QRS Duration: 107 QT  Interval:  351 QTC Calculation: 510 R Axis:   165 Text Interpretation:  Atrial fibrillation Right axis deviation Anteroseptal infarct, old Nonspecific T abnormalities, inferior leads Prolonged QT interval Confirmed by [facility]  MD, Laelia Angelo ([id number]) on 05/12/2013 11:10:25 PM            MDM   Final diagnoses:  None    With afib will need admission    Yusuke Beza K Darlina Mccaughey-Rasch, MD 05/13/13 [id number]

## 2013-05-13 NOTE — H&P (Addendum)
[facility] History and Physical  Jacob [name] HDQ:[id number] DOB: Oct 23, 1955 DOA: 05/12/2013  Referring physician: EDP PCP: No PCP Per Patient   Chief Complaint: Chest pain   HPI: Jacob [name] is a 58 y.o. male who presents to the ED with chest pain.  Symptoms onset 2 days ago, associated with productive cough.  Pleuritic type chest pain worse with deep inspiration.  Subjectively no fever nor chills per patient.  Does have increasing SOB as well as palpitations.  Symptoms acutely worse over the past couple of hours.  Forgot to take his A.Fib rate control meds today.  Was in hospital with PNA in January.  Findings in ED include A.Fib RVR, RML PNA on CXR.  Review of Systems: Systems reviewed.  As above, otherwise negative  Past Medical History  Diagnosis Date  . Diabetes mellitus   . Hypertension   . Systolic heart failure     EF is 40-45% by echo, December 2013  . Noncompliance   . CAD (coronary artery disease) Sept 2013    s/p cardiac cath showing occlusion of small RCA with collaterals  . Chronic anticoagulation     on coumadin  . Atrial fibrillation    Past Surgical History  Procedure Laterality Date  . S/p cardiac cath  Sept 2013  . Coronary artery bypass graft      2 vessels per patient Jacob [name])  July 2014  . Implantable cardioverter defibrillator implant      [place] in 07/2012; [employer]   Social History:  reports that he has been smoking Cigarettes.  He has a 10 pack-year smoking history. He has never used smokeless tobacco. He reports that he does not drink alcohol or use illicit drugs.  No Known Allergies  Family History  Problem Relation Age of Onset  . Diabetes Mother      Prior to Admission medications   Medication Sig Start Date End Date Taking? Authorizing Provider  atorvastatin (LIPITOR) 40 MG tablet Take 1 tablet (40 mg total) by mouth daily at 6 PM. 04/07/13   Jacob Art, DO  metFORMIN (GLUCOPHAGE) 500 MG tablet Take 1 tablet (500 mg  total) by mouth 2 (two) times daily with a meal. 04/07/13   Jacob Art, DO  metoprolol tartrate (LOPRESSOR) 25 MG tablet Take 3 tablets (75 mg total) by mouth 2 (two) times daily. 04/07/13   Jacob Art, DO  Rivaroxaban (XARELTO) 20 MG TABS tablet Take 1 tablet (20 mg total) by mouth daily with supper. 04/07/13   Jacob Art, DO   Physical Exam: Filed Vitals:   05/12/13 2300  BP: 141/95  Pulse: 135  Temp:   Resp: 24    BP 141/95  Pulse 135  Temp(Src) 97.8 F (36.6 C) (Oral)  Resp 24  Ht 5\' 7"  (1.702 m)  Wt 85.73 kg (189 lb)  BMI 29.59 kg/m2  SpO2 99%  General Appearance:    Alert, oriented, mild distress and ill appearing, appears stated age  Head:    Normocephalic, atraumatic  Eyes:    PERRL, EOMI, sclera non-icteric        [name]:   Nares without drainage or epistaxis. Mucosa, turbinates normal  Throat:   Moist mucous membranes. Oropharynx without erythema or exudate.  Neck:   Supple. No carotid bruits.  No thyromegaly.  No lymphadenopathy.   Back:     No CVA tenderness, no spinal tenderness  Lungs:     Clear to auscultation bilaterally, without wheezes, rhonchi or rales  Chest wall:    No tenderness to palpitation  Heart:    Regular rate and rhythm without murmurs, gallops, rubs  Abdomen:     Soft, non-tender, nondistended, normal bowel sounds, no organomegaly  Genitalia:    deferred  Rectal:    deferred  Extremities:   No clubbing, cyanosis or edema.  Pulses:   2+ and symmetric all extremities  Skin:   Skin color, texture, turgor normal, no rashes or lesions  Lymph nodes:   Cervical, supraclavicular, and axillary nodes normal  Neurologic:   CNII-XII intact. Normal strength, sensation and reflexes      throughout    Labs on Admission:  Basic Metabolic Panel:  Recent Labs Lab 05/12/13 2347  NA 136*  K 4.2  CL 96  GLUCOSE 234*  BUN 39*  CREATININE 1.50*   Liver Function Tests: No results found for this basename: AST, ALT, ALKPHOS, BILITOT, PROT,  ALBUMIN,  in the last 168 hours No results found for this basename: LIPASE, AMYLASE,  in the last 168 hours No results found for this basename: AMMONIA,  in the last 168 hours CBC:  Recent Labs Lab 05/12/13 2342 05/12/13 2347  WBC 7.8  --   NEUTROABS 5.2  --   HGB 11.1* 14.3  HCT 38.0* 42.0  MCV 69.0*  --   PLT 254  --    Cardiac Enzymes: No results found for this basename: CKTOTAL, CKMB, CKMBINDEX, TROPONINI,  in the last 168 hours  BNP (last 3 results)  Recent Labs  02/11/13 1203 04/04/13 0320 04/07/13 0420  PROBNP 6486.0* 6119.0* 3282.0*   CBG: No results found for this basename: GLUCAP,  in the last 168 hours  Radiological Exams on Admission: Dg Chest 2 View  05/12/2013   CLINICAL DATA:  Chest pain  EXAM: CHEST  2 VIEW  COMPARISON:  04/04/2013  FINDINGS: Consolidation noted in the right middle lobe concerning for pneumonia. This has worsened since prior study. Small right pleural effusion. Left lung is clear. Her left pacer is in place, unchanged. Stable cardiomegaly.  IMPRESSION: Worsening right middle lobe airspace opacity concerning for worsening pneumonia.  Small right pleural effusion.   Electronically Signed   By: Jacob [name] M.D.   On: 05/12/2013 23:44    EKG: Independently reviewed.  Assessment/Plan Principal Problem:   HCAP (healthcare-associated pneumonia) Active Problems:   Type 2 diabetes mellitus not at goal   Noncompliance   Atrial fibrillation with RVR   1. HCAP - given recent admission treating his RML PNA seen on CXR as HCAP, cefepime and vanc per pharm.  Gentle hydration. 2. A.Fib RVR - starting cardizem gtt, hopefully will further improve on cardizem gtt 3. DM2 - hold metformin and putting patient on med dose SSI AC/HS. 4. Noncompliance - on further reivew with the patient he states he currently takes NO home meds what so ever and hasnt even filled his metoprolol nor his Xarelto.  As a result of this will not order these right now, likely  needs to be slowly titrated up on metoprolol while inpatient after acute RVR controlled to get him off the cardizem gtt.  Regarding xarelto, the fact that he states he is NOT taking this is somewhat alarming given that he is essentially telling me his INR is theraputic at baseline INR today 1.97.  Repeat INR during visit and if remains high then needs liver work up.  Liver function panel ordered now.    Code Status: Full  Family Communication: No family in  room Disposition Plan: Admit to inpatient   Time spent: 70 min  GARDNER, JARED M. [facility] Pager [phone]  If 7AM-7PM, please contact the day team taking care of the patient [email or web address] Password [facility] 05/13/2013, 12:51 AM

## 2013-05-14 ENCOUNTER — Inpatient Hospital Stay (HOSPITAL_COMMUNITY): Payer: Medicaid - Out of State

## 2013-05-14 DIAGNOSIS — N179 Acute kidney failure, unspecified: Secondary | ICD-10-CM

## 2013-05-14 DIAGNOSIS — I5023 Acute on chronic systolic (congestive) heart failure: Principal | ICD-10-CM

## 2013-05-14 LAB — COMPREHENSIVE METABOLIC PANEL
ALT: 105 U/L — AB (ref 0–53)
AST: 110 U/L — AB (ref 0–37)
Albumin: 3.1 g/dL — ABNORMAL LOW (ref 3.5–5.2)
Alkaline Phosphatase: 174 U/L — ABNORMAL HIGH (ref 39–117)
BUN: 43 mg/dL — ABNORMAL HIGH (ref 6–23)
CO2: 23 meq/L (ref 19–32)
Calcium: 8.6 mg/dL (ref 8.4–10.5)
Chloride: 96 mEq/L (ref 96–112)
Creatinine, Ser: 1.57 mg/dL — ABNORMAL HIGH (ref 0.50–1.35)
GFR calc Af Amer: 55 mL/min — ABNORMAL LOW (ref >90)
GFR, EST NON AFRICAN AMERICAN: 48 mL/min — AB (ref >90)
GLUCOSE: 120 mg/dL — AB (ref 70–99)
POTASSIUM: 4.8 meq/L (ref 3.7–5.3)
SODIUM: 134 meq/L — AB (ref 137–147)
Total Bilirubin: 1.7 mg/dL — ABNORMAL HIGH (ref 0.3–1.2)
Total Protein: 7.2 g/dL (ref 6.0–8.3)

## 2013-05-14 LAB — RAPID URINE DRUG SCREEN, HOSP PERFORMED
AMPHETAMINES: NOT DETECTED
Barbiturates: NOT DETECTED
Benzodiazepines: NOT DETECTED
Cocaine: NOT DETECTED
Opiates: NOT DETECTED
Tetrahydrocannabinol: NOT DETECTED

## 2013-05-14 LAB — HEPATITIS PANEL, ACUTE
HCV Ab: NEGATIVE
Hep A IgM: NONREACTIVE
Hep B C IgM: NONREACTIVE
Hepatitis B Surface Ag: NEGATIVE

## 2013-05-14 LAB — GLUCOSE, CAPILLARY
GLUCOSE-CAPILLARY: 206 mg/dL — AB (ref 70–99)
GLUCOSE-CAPILLARY: 156 mg/dL — AB (ref 70–99)
Glucose-Capillary: 134 mg/dL — ABNORMAL HIGH (ref 70–99)
Glucose-Capillary: 150 mg/dL — ABNORMAL HIGH (ref 70–99)

## 2013-05-14 LAB — PROTIME-INR
INR: 4.88 — AB (ref 0.00–1.49)
PROTHROMBIN TIME: 43.6 s — AB (ref 11.6–15.2)

## 2013-05-14 LAB — HEMOGLOBIN A1C
Hgb A1c MFr Bld: 7.7 % — ABNORMAL HIGH (ref <5.7)
Mean Plasma Glucose: 174 mg/dL — ABNORMAL HIGH (ref <117)

## 2013-05-14 LAB — PRO B NATRIURETIC PEPTIDE: PRO B NATRI PEPTIDE: 1782 pg/mL — AB (ref 0–125)

## 2013-05-14 LAB — STREP PNEUMONIAE URINARY ANTIGEN: STREP PNEUMO URINARY ANTIGEN: NEGATIVE

## 2013-05-14 MED ORDER — GLUCERNA SHAKE PO LIQD
237.0000 mL | Freq: Two times a day (BID) | ORAL | Status: DC
  Administered 2013-05-14 – 2013-05-19 (×7): 237 mL via ORAL

## 2013-05-14 MED ORDER — FUROSEMIDE 40 MG PO TABS
40.0000 mg | ORAL_TABLET | Freq: Once | ORAL | Status: AC
  Administered 2013-05-14: 40 mg via ORAL

## 2013-05-14 MED ORDER — PNEUMOCOCCAL VAC POLYVALENT 25 MCG/0.5ML IJ INJ
0.5000 mL | INJECTION | INTRAMUSCULAR | Status: AC
  Administered 2013-05-15: 0.5 mL via INTRAMUSCULAR
  Filled 2013-05-14: qty 0.5

## 2013-05-14 MED ORDER — FUROSEMIDE 40 MG PO TABS
40.0000 mg | ORAL_TABLET | Freq: Two times a day (BID) | ORAL | Status: DC
  Administered 2013-05-14 – 2013-05-15 (×2): 40 mg via ORAL
  Filled 2013-05-14 (×4): qty 1

## 2013-05-14 NOTE — Progress Notes (Signed)
Pt desats at times,placed o2 at 2l [place] to keep >90 -occ pt removed oxygen.

## 2013-05-14 NOTE — Progress Notes (Signed)
Agree with interventions and documentation as specified by dietetic intern.  Demiah Gullickson, MS RD LDN Clinical Inpatient Dietitian Pager: [phone] Weekend/After hours pager: [phone] 

## 2013-05-14 NOTE — Progress Notes (Signed)
INITIAL NUTRITION ASSESSMENT  DOCUMENTATION CODES Per approved criteria  -Obesity Unspecified   INTERVENTION: Provide Glucerna BID between meals, each container provides 190 kcals and 10 grams of protein.  NUTRITION DIAGNOSIS: Inadequate oral intake related to poor PO intake as evidenced by meal completion of 25%.   Goal: Pt to meet >/= 90% of their estimated nutrition needs.  Monitor:  PO intake, weight trends, labs, oral supplement acceptance  Reason for Assessment: MD consult for assessment of nutrition requirements/status  58 y.o. male  Admitting Dx: HCAP (healthcare-associated pneumonia)  ASSESSMENT: Jacob [name] is a 58 y.o. male with PMH of T2DM, HTN, systolic heart failure, noncompliance, and CAD who presents to the ED with chest pain. Symptoms onset 2 days ago, associated with productive cough. Pleuritic type chest pain worse with deep inspiration. Findings in ED include A.Fib RVR, RML PNA on CXR. Was in hospital with PNA in January.  Pt reports having a good appetite at home eating 2 meals a day. Pt reports having weight loss, however per EPIC weight readings weight has been stable. Pt reports having a very good appetite ~1 year ago before he got sick with more consumption of food at each meals. Pt is having trouble getting around as he reports every 5 meters of walking, he has to stop to rest. Pt reports he has lost muscle mass. Findings from physical exam shows no significant fat or muscle mass depletion. Meal completion is 25% (lunch), pt reports he does not know why he did not want to eat at lunch time. Pt is willing to try oral supplements. RD will order Glucerna.  Nutrition Focused Physical Exam:  Subcutaneous Fat:  Orbital Region: N/A Upper Arm Region: WNL Thoracic and Lumbar Region: WNL  Muscle:  Temple Region: WNL Clavicle Bone Region: WNL Clavicle and Acromion Bone Region: WNL Scapular Bone Region: N/A Dorsal Hand: WNL Patellar Region: WNL Anterior Thigh  Region: N/A Posterior Calf Region: WNL  Edema: +1 edema on lower extremities.  Height: Ht Readings from Last 1 Encounters:  05/13/13 5\' 7"  (1.702 m)    Weight: Wt Readings from Last 1 Encounters:  05/14/13 215 lb 9.6 oz (97.796 kg)    Ideal Body Weight: 148 lb  % Ideal Body Weight: 145%  Wt Readings from Last 10 Encounters:  05/14/13 215 lb 9.6 oz (97.796 kg)  04/07/13 196 lb 13.9 oz (89.3 kg)  01/12/13 209 lb (94.802 kg)  02/17/12 216 lb 12.8 oz (98.34 kg)  01/13/12 228 lb 12.8 oz (103.783 kg)  12/30/11 225 lb (102.059 kg)  12/16/11 220 lb (99.791 kg)  12/04/11 206 lb 14.4 oz (93.849 kg)  12/04/11 206 lb 14.4 oz (93.849 kg)  11/08/11 221 lb 1.9 oz (100.299 kg)    Usual Body Weight: 206 lb  % Usual Body Weight: 104%  BMI:  Body mass index is 33.76 kg/(m^2). Class I obesity  Estimated Nutritional Needs: Kcal: 1700-1900 Protein: 85-95 grams Fluid: 1.7 L-1.9L/day  Skin: +1 in lower extremeties  Diet Order: Cardiac  EDUCATION NEEDS: -No education needs identified at this time   Intake/Output Summary (Last 24 hours) at 05/14/13 1543 Last data filed at 05/14/13 1359  Gross per 24 hour  Intake   1345 ml  Output    350 ml  Net    995 ml    Last BM: 2/20   Labs:   Recent Labs Lab 05/12/13 2347 05/14/13 0255  NA 136* 134*  K 4.2 4.8  CL 96 96  CO2  --  23  BUN 39* 43*  CREATININE 1.50* 1.57*  CALCIUM  --  8.6  GLUCOSE 234* 120*    CBG (last 3)   Recent Labs  05/13/13 2115 05/14/13 0755 05/14/13 1224  GLUCAP 132* 134* 206*   Lab Results  Component Value Date   HGBA1C 7.7* 05/13/2013    Scheduled Meds: . atorvastatin  40 mg Oral q1800  . ceFEPime (MAXIPIME) IV  1 g Intravenous 3 times per day  . furosemide  40 mg Oral BID  . insulin aspart  0-5 Units Subcutaneous QHS  . insulin aspart  0-9 Units Subcutaneous TID WC  . metoprolol tartrate  75 mg Oral BID  . [START ON 05/15/2013] pneumococcal 23 valent vaccine  0.5 mL Intramuscular  Tomorrow-1000  . rivaroxaban  20 mg Oral Q supper  . vancomycin  750 mg Intravenous Q12H    Continuous Infusions: . diltiazem (CARDIZEM) infusion Stopped (05/13/13 1603)    Past Medical History  Diagnosis Date  . Diabetes mellitus   . Hypertension   . Systolic heart failure     EF is 40-45% by echo, December 2013  . Noncompliance   . CAD (coronary artery disease) Sept 2013    s/p cardiac cath showing occlusion of small RCA with collaterals  . Chronic anticoagulation     on coumadin  . Atrial fibrillation     Past Surgical History  Procedure Laterality Date  . S/p cardiac cath  Sept 2013  . Coronary artery bypass graft      2 vessels per patient [name]([place])  July 2014  . Implantable cardioverter defibrillator implant      [place] [facility] 07/2012[facility]    Black & DeckerStephanie Brittini Brubeck Dietetic Intern Pager: [phone]

## 2013-05-14 NOTE — Progress Notes (Signed)
[facility] PROGRESS NOTE  Jacob [name] VVO:[id number] DOB: [birth year] DOA: 05/12/2013 PCP: No PCP Per Patient  Assessment/Plan: Principal Problem:   HCAP (healthcare-associated pneumonia): On vancomycin and cefepime.  For now continue IV antibiotics, am hesitant to change to Levaquin given coagulopathy.  Active Problems:   Acute on Chronic systolic congestive heart failure: Interestingly, patient never did make followup appointment and therefore did not restart Lasix, however BNP only mildly elevated at 2935 in the setting of rapid atrial fibrillation and healthcare associated pneumonia. Restarted Lasix at 20 mg by mouth and we'll measure output. Patient is actually up several liters and his weight is up 36 pounds since admission.  Although BNP down today, we'll continue Lasix for now    Type 2 diabetes mellitus not at goal: Checking A1c again. Patient did not fill prescription for metformin. However, question metformin would be the best medication patient with poor ejection fraction. For now on sliding-scale and may benefit from low-dose glipizide.   Noncompliance: Patient remains noncompliant. He did not take any of his medications. He is able to financially afford his cigarettes. Will need to have extensive discussions with this patient.    Atrial fibrillation with RVR: Off Cardizem drip on by mouth metoprolol    Elevated INR: Unclear etiology. Discussed with pharmacy. INR can be elevated in patient on Xarelto, however patient has not taken Xarelto since last discharge 5 weeks ago. He is not on Coumadin. Even in the setting of heart failure with hepatic congestion, this would not necessarily explain this. Patient denies alcohol abuse. He does have some fatty infiltration of the liver. Wouldn't Xarelto restarted, INR elevated today to 4.88. Suspect that he is secondary to congestion from acute heart failure.  His INR further elevated tomorrow, we'll discuss with pharmacy and may need to  discontinue Xarelto for risk of bleeding   acute renal failure: Slightly elevated, likely from dehydration versus decreased perfusion from congestive heart failure. I do not think patient has been taking Lasix. We'll discontinue IV fluids and start low-dose Lasix  Tobacco abuse: Will offer nicotine patch  Code Status: Full code Family Communication:  Patient lives with a roommate. He says he is estranged from his family Disposition Plan:  Transfer to floor today    Consultants:  None  Procedures:  None  Antibiotics:  IV vancomycin and Zosyn, day 2  HPI/Subjective: Patient seen down in step down unit. Feeling better. Breathing easier. Fatigued.  Objective: Filed Vitals:   05/14/13 1500  BP: 106/81  Pulse: 91  Temp: 97.2 F (36.2 C)  Resp: 18    Intake/Output Summary (Last 24 hours) at 05/14/13 1912 Last data filed at 05/14/13 1812  Gross per 24 hour  Intake   1580 ml  Output    350 ml  Net   1230 ml   Filed Weights   05/14/13 0310 05/14/13 0930 05/14/13 1043  Weight: 97.523 kg (215 lb) 97.4 kg (214 lb 11.7 oz) 97.796 kg (215 lb 9.6 oz)    Exam:   General:  Alert and oriented x3, fatigue, no acute distress  Cardiovascular: Irregular rhythm,  rate controlled   Respiratory: Decreased breath sounds bibasilar, otherwise clear to auscultation bilaterally  Abdomen: Soft, nontender, nondistended, positive bowel sounds  Musculoskeletal: No clubbing or cyanosis, trace edema   Data Reviewed: Basic Metabolic Panel:  Recent Labs Lab 05/12/13 2347 05/14/13 0255  NA 136* 134*  K 4.2 4.8  CL 96 96  CO2  --  23  GLUCOSE 234* 120*  BUN 39* 43*  CREATININE 1.50* 1.57*  CALCIUM  --  8.6   Liver Function Tests:  Recent Labs Lab 05/12/13 2342 05/14/13 0255  AST 79* 110*  ALT 76* 105*  ALKPHOS 160* 174*  BILITOT 1.9* 1.7*  PROT 7.1 7.2  ALBUMIN 3.1* 3.1*   CBC:  Recent Labs Lab 05/12/13 2342 05/12/13 2347  WBC 7.8  --   NEUTROABS 5.2  --   HGB  11.1* 14.3  HCT 38.0* 42.0  MCV 69.0*  --   PLT 254  --    Cardiac Enzymes: No results found for this basename: CKTOTAL, CKMB, CKMBINDEX, TROPONINI,  in the last 168 hours BNP (last 3 results)  Recent Labs  04/07/13 0420 05/13/13 0758 05/14/13 1031  PROBNP 3282.0* 2935.0* 1782.0*   CBG:  Recent Labs Lab 05/13/13 1632 05/13/13 2115 05/14/13 0755 05/14/13 1224 05/14/13 1728  GLUCAP 122* 132* 134* 206* 156*    Recent Results (from the past 240 hour(s))  CULTURE, BLOOD (ROUTINE X 2)     Status: None   Collection Time    05/13/13 12:20 AM      Result Value Ref Range Status   Specimen Description BLOOD LEFT ARM   Final   Special Requests BOTTLES DRAWN AEROBIC AND ANAEROBIC 10 CC EACH   Final   Culture  Setup Time     Final   Value: 05/13/2013 04:23     Performed at [facility]   Culture     Final   Value:        BLOOD CULTURE RECEIVED NO GROWTH TO DATE CULTURE WILL BE HELD FOR 5 DAYS BEFORE ISSUING A FINAL NEGATIVE REPORT     Performed at [facility]   Report Status PENDING   Incomplete  CULTURE, BLOOD (ROUTINE X 2)     Status: None   Collection Time    05/13/13 12:30 AM      Result Value Ref Range Status   Specimen Description BLOOD LEFT HAND   Final   Special Requests BOTTLES DRAWN AEROBIC ONLY 10 CC   Final   Culture  Setup Time     Final   Value: 05/13/2013 08:13     Performed at [facility]   Culture     Final   Value:        BLOOD CULTURE RECEIVED NO GROWTH TO DATE CULTURE WILL BE HELD FOR 5 DAYS BEFORE ISSUING A FINAL NEGATIVE REPORT     Performed at [facility]   Report Status PENDING   Incomplete  MRSA PCR SCREENING     Status: None   Collection Time    05/13/13  2:14 AM      Result Value Ref Range Status   MRSA by PCR NEGATIVE  NEGATIVE Final   Comment:            The GeneXpert MRSA Assay (FDA     approved for NASAL specimens     only), is one component of a     comprehensive MRSA colonization     surveillance  program. It is not     intended to diagnose MRSA     infection nor to guide or     monitor treatment for     MRSA infections.     Studies: Dg Chest 2 View  05/12/2013   CLINICAL DATA:  Chest pain  EXAM: CHEST  2 VIEW  COMPARISON:  04/04/2013  FINDINGS: Consolidation noted in the right middle lobe  concerning for pneumonia. This has worsened since prior study. Small right pleural effusion. Left lung is clear. Her left pacer is in place, unchanged. Stable cardiomegaly.  IMPRESSION: Worsening right middle lobe airspace opacity concerning for worsening pneumonia.  Small right pleural effusion.   Electronically Signed   By: Charlett NoseKevin  Dover M.D.   On: 05/12/2013 23:44   [place] Abdomen Complete  05/14/2013   CLINICAL DATA:  Elevated INR, elevated LFTs  EXAM: ULTRASOUND ABDOMEN COMPLETE  COMPARISON:  05/17/2012  FINDINGS: Gallbladder:  Well distended without evidence of cholelithiasis. Gallbladder wall thickening to 7 mm is noted. A negative sonographic Eulah PontMurphy sign is seen. The wall thickening may be related to the mild ascites.  Common bile duct:  Diameter: 3.8 mm.  Liver:  Increase in echogenicity consistent with fatty infiltration.  IVC:  No abnormality visualized.  Pancreas:  Not well visualized  Spleen:  Size and appearance within normal limits.  Right Kidney:  Length: 12.2 cm.  A 3.3 cm cyst is noted in the upper pole.  Left Kidney:  Length: 12.3 cm. Echogenicity within normal limits. No mass or hydronephrosis visualized.  Abdominal aorta:  No aneurysm visualized.  Other findings:  Bilateral pleural effusions are identified. Right greater than left. Mild ascites is noted. No sizable pocket to allow for safe paracentesis is noted.  IMPRESSION: Gallbladder wall thickening. This may be related to the minimal ascites. No gallstones are seen.  Bilateral pleural effusions right greater than left.  Minimal ascites.   Electronically Signed   By: Alcide CleverMark  Lukens M.D.   On: 05/14/2013 09:20    Scheduled Meds: .  atorvastatin  40 mg Oral q1800  . ceFEPime (MAXIPIME) IV  1 g Intravenous 3 times per day  . feeding supplement (GLUCERNA SHAKE)  237 mL Oral BID BM  . furosemide  40 mg Oral BID  . insulin aspart  0-5 Units Subcutaneous QHS  . insulin aspart  0-9 Units Subcutaneous TID WC  . metoprolol tartrate  75 mg Oral BID  . [START ON 05/15/2013] pneumococcal 23 valent vaccine  0.5 mL Intramuscular Tomorrow-1000  . rivaroxaban  20 mg Oral Q supper  . vancomycin  750 mg Intravenous Q12H   Continuous Infusions:    Principal Problem:   HCAP (healthcare-associated pneumonia) Active Problems:   Chronic systolic congestive heart failure   Type 2 diabetes mellitus not at goal   Noncompliance   Atrial fibrillation with RVR   Elevated INR  acute renal failure   Time spent:  35  minutes    Hollice EspyKRISHNAN,SENDIL K  [facility] Pager [phone]. If 7PM-7AM, please contact night-coverage at [email or web address], password [facility] 05/14/2013, 7:12 PM  LOS: 2 days

## 2013-05-14 NOTE — Progress Notes (Signed)
sats occasionally drop in the upper 70s nonsustained without 02.pt occ takes o2 off.

## 2013-05-14 NOTE — Discharge Instructions (Addendum)
Follow with Primary MD and Dr Melburn PopperNasher in 7 days   Get CBC, CMP, checked 7 days by Primary MD and again as instructed by your Primary MD. Get a 2 view Chest X ray done next visit.   You must get your blood work checked within a week as you her on high doses of diuretics.  Accuchecks 4 times/day, Once in AM empty stomach and then before each meal. Log in all results and show them to your Prim.MD in 3 days. If any glucose reading is under 80 or above 300 call your Prim MD immidiately. Follow Low glucose instructions for glucose under 80 as instructed.   Activity: As tolerated with Full fall precautions use walker/cane & assistance as needed   Disposition Home     Diet: Heart Healthy  - Low carb .Check your Weight same time everyday, if you gain over 2 pounds, or you develop in leg swelling, experience more shortness of breath or chest pain, call your Primary MD immediately. Follow Cardiac Low Salt Diet and 1.5 lit/day fluid restriction.   On your next visit with her primary care physician please Get Medicines reviewed and adjusted.  Please request your Prim.MD to go over all Hospital Tests and Procedure/Radiological results at the follow up, please get all Hospital records sent to your Prim MD by signing hospital release before you go home.   If you experience worsening of your admission symptoms, develop shortness of breath, life threatening emergency, suicidal or homicidal thoughts you must seek medical attention immediately by calling 911 or calling your MD immediately  if symptoms less severe.  You Must read complete instructions/literature along with all the possible adverse reactions/side effects for all the Medicines you take and that have been prescribed to you. Take any new Medicines after you have completely understood and accpet all the possible adverse reactions/side effects.   Do not drive and provide baby sitting services if your were admitted for syncope or siezures until  you have seen by Primary MD or a Neurologist and advised to do so again.  Do not drive when taking Pain medications.    Do not take more than prescribed Pain, Sleep and Anxiety Medications  Special Instructions: If you have smoked or chewed Tobacco  in the last 2 yrs please stop smoking, stop any regular Alcohol  and or any Recreational drug use.  Wear Seat belts while driving.   Please note  You were cared for by a hospitalist during your hospital stay. If you have any questions about your discharge medications or the care you received while you were in the hospital after you are discharged, you can call the unit and asked to speak with the hospitalist on call if the hospitalist that took care of you is not available. Once you are discharged, your primary care physician will handle any further medical issues. Please note that NO REFILLS for any discharge medications will be authorized once you are discharged, as it is imperative that you return to your primary care physician (or establish a relationship with a primary care physician if you do not have one) for your aftercare needs so that they can reassess your need for medications and monitor your lab values.        Information on my medicine - XARELTO (Rivaroxaban) Why was Xarelto prescribed for you? Xarelto was prescribed for you to reduce the risk of a blood clot forming that can cause a stroke if you have a medical condition called atrial fibrillation (  a type of irregular heartbeat).  What do you need to know about xarelto ? Take your Xarelto ONCE DAILY at the same time every day with your evening meal. If you have difficulty swallowing the tablet whole, you may crush it and mix in applesauce just prior to taking your dose.  Take Xarelto exactly as prescribed by your doctor and DO NOT stop taking Xarelto without talking to the doctor who prescribed the medication.  Stopping without other stroke prevention medication to take  the place of Xarelto may increase your risk of developing a clot that causes a stroke.  Refill your prescription before you run out.  After discharge, you should have regular check-up appointments with your healthcare provider that is prescribing your Xarelto.  In the future your dose may need to be changed if your kidney function or weight changes by a significant amount.  What do you do if you miss a dose? If you are taking Xarelto ONCE DAILY and you miss a dose, take it as soon as you remember on the same day then continue your regularly scheduled once daily regimen the next day. Do not take two doses of Xarelto at the same time or on the same day.   Important Safety Information A possible side effect of Xarelto is bleeding. You should call your healthcare provider right away if you experience any of the following:   Bleeding from an injury or your nose that does not stop.   Unusual colored urine (red or dark brown) or unusual colored stools (red or black).   Unusual bruising for unknown reasons.   A serious fall or if you hit your head (even if there is no bleeding).  Some medicines may interact with Xarelto and might increase your risk of bleeding while on Xarelto. To help avoid this, consult your healthcare provider or pharmacist prior to using any new prescription or non-prescription medications, including herbals, vitamins, non-steroidal anti-inflammatory drugs (NSAIDs) and supplements.  This website has more information on Xarelto: [email or web address].com.br.

## 2013-05-15 LAB — BASIC METABOLIC PANEL
BUN: 42 mg/dL — AB (ref 6–23)
CO2: 28 mEq/L (ref 19–32)
CREATININE: 1.55 mg/dL — AB (ref 0.50–1.35)
Calcium: 8.5 mg/dL (ref 8.4–10.5)
Chloride: 96 mEq/L (ref 96–112)
GFR, EST AFRICAN AMERICAN: 56 mL/min — AB (ref >90)
GFR, EST NON AFRICAN AMERICAN: 48 mL/min — AB (ref >90)
GLUCOSE: 170 mg/dL — AB (ref 70–99)
POTASSIUM: 5.1 meq/L (ref 3.7–5.3)
Sodium: 136 mEq/L — ABNORMAL LOW (ref 137–147)

## 2013-05-15 LAB — PROTIME-INR
INR: 6.23 (ref 0.00–1.49)
Prothrombin Time: 52.5 seconds — ABNORMAL HIGH (ref 11.6–15.2)

## 2013-05-15 LAB — CBC
HEMATOCRIT: 39 % (ref 39.0–52.0)
Hemoglobin: 11.3 g/dL — ABNORMAL LOW (ref 13.0–17.0)
MCH: 20.1 pg — ABNORMAL LOW (ref 26.0–34.0)
MCHC: 29 g/dL — ABNORMAL LOW (ref 30.0–36.0)
MCV: 69.5 fL — AB (ref 78.0–100.0)
PLATELETS: 282 10*3/uL (ref 150–400)
RBC: 5.61 MIL/uL (ref 4.22–5.81)
RDW: 22.7 % — AB (ref 11.5–15.5)
WBC: 8.2 10*3/uL (ref 4.0–10.5)

## 2013-05-15 LAB — GLUCOSE, CAPILLARY
GLUCOSE-CAPILLARY: 158 mg/dL — AB (ref 70–99)
Glucose-Capillary: 148 mg/dL — ABNORMAL HIGH (ref 70–99)
Glucose-Capillary: 203 mg/dL — ABNORMAL HIGH (ref 70–99)
Glucose-Capillary: 190 mg/dL — ABNORMAL HIGH (ref 70–99)

## 2013-05-15 MED ORDER — CARVEDILOL 12.5 MG PO TABS
12.5000 mg | ORAL_TABLET | Freq: Two times a day (BID) | ORAL | Status: DC
  Filled 2013-05-15 (×2): qty 1

## 2013-05-15 MED ORDER — ZOLPIDEM TARTRATE 5 MG PO TABS
5.0000 mg | ORAL_TABLET | Freq: Every day | ORAL | Status: DC
  Administered 2013-05-15 – 2013-05-18 (×4): 5 mg via ORAL
  Filled 2013-05-15 (×4): qty 1

## 2013-05-15 MED ORDER — CARVEDILOL 12.5 MG PO TABS
12.5000 mg | ORAL_TABLET | Freq: Two times a day (BID) | ORAL | Status: DC
  Administered 2013-05-15 – 2013-05-16 (×3): 12.5 mg via ORAL
  Filled 2013-05-15 (×4): qty 1

## 2013-05-15 MED ORDER — RIVAROXABAN 20 MG PO TABS
20.0000 mg | ORAL_TABLET | Freq: Every day | ORAL | Status: DC
  Administered 2013-05-15 – 2013-05-19 (×5): 20 mg via ORAL
  Filled 2013-05-15 (×5): qty 1

## 2013-05-15 MED ORDER — GLIPIZIDE 2.5 MG HALF TABLET
2.5000 mg | ORAL_TABLET | Freq: Every day | ORAL | Status: DC
  Administered 2013-05-16: 2.5 mg via ORAL
  Filled 2013-05-15 (×2): qty 1

## 2013-05-15 MED ORDER — FUROSEMIDE 10 MG/ML IJ SOLN
20.0000 mg | Freq: Two times a day (BID) | INTRAMUSCULAR | Status: DC
  Administered 2013-05-15 – 2013-05-16 (×2): 20 mg via INTRAVENOUS
  Filled 2013-05-15 (×3): qty 2

## 2013-05-15 MED ORDER — LEVOFLOXACIN 750 MG PO TABS
750.0000 mg | ORAL_TABLET | Freq: Every day | ORAL | Status: AC
  Administered 2013-05-15 – 2013-05-19 (×5): 750 mg via ORAL
  Filled 2013-05-15 (×5): qty 1

## 2013-05-15 NOTE — Progress Notes (Addendum)
[facility] PROGRESS NOTE  Jacob [name] PET:[id number] DOB: 01-31-1956 DOA: 05/12/2013 PCP: No PCP Per Patient  Assessment/Plan: Principal Problem:   HCAP (healthcare-associated pneumonia): On vancomycin and cefepime. To limit IV fluids, will change to Levaquin tablet. Discussed with pharmacy. Levaquin should not affect coagulopathy  Active Problems:   Acute on Chronic systolic congestive heart failure: Interestingly, patient never did make followup appointment.  Weight now up 36 pounds after IV fluids and Cardizem drip plus IV antibiotics. Minimal response to by mouth Lasix so we'll change to IV. Watch renal function     Type 2 diabetes mellitus not at goal: Checking A1c again. Patient did not fill prescription for metformin. However, question metformin would be the best medication patient with poor ejection fraction. For now on sliding-scale and may benefit from low-dose glipizide, will start in morning   Noncompliance: Patient remains noncompliant. He did not take any of his medications. He is able to financially afford his cigarettes. Will need to have extensive discussions with this patient.    Atrial fibrillation with RVR: Off Cardizem drip on by mouth metoprolol.  Heart rate up, likely from volume overload. Changed over to Coreg     Elevated INR: Unclear etiology. Discussed with pharmacy. INR can be elevated in patient on Xarelto, however patient has not taken Xarelto since last discharge 5 weeks ago. He is not on Coumadin. Even in the setting of heart failure with hepatic congestion, this would not necessarily explain this. Patient denies alcohol abuse. He does have some fatty infiltration of the liver.  with Xarelto restarted,  INR steadily rising up to 6.4  in discussion pharmacy, they feel that the Xarelto can affect the INR, however it is not an indication marker for his bleeding. The patient is to continue Xarelto stopped checking INR.   acute renal failure: Slightly elevated,  likely from dehydration versus decreased perfusion from congestive heart failure. I do not think patient has been taking Lasix. We'll discontinue IV fluids and start low-dose Lasix, renal failure unchanged from yesterday   Tobacco abuse: Will offer nicotine patch  Code Status: Full code Family Communication:  Patient lives with a roommate. He says he is estranged from his family Disposition Plan:   Here until fully diuresed    Consultants:  None  Procedures:  None  Antibiotics:  IV vancomycin and Zosyn, 2/19-2/21  Levaquin by mouth: 2/21-present  HPI/Subjective: Patient having rough time sleeping. Feels very volume overloaded. Complains of legs being swollen. Dyspnea on exertion.  Objective: Filed Vitals:   05/15/13 1040  BP: 142/112  Pulse: 108  Temp:   Resp:     Intake/Output Summary (Last 24 hours) at 05/15/13 1304 Last data filed at 05/15/13 2257  Gross per 24 hour  Intake   1800 ml  Output      0 ml  Net   1800 ml   Filed Weights   05/14/13 0930 05/14/13 1043 05/15/13 0620  Weight: 97.4 kg (214 lb 11.7 oz) 97.796 kg (215 lb 9.6 oz) 97.7 kg (215 lb 6.2 oz)    Exam:   General:  Alert and oriented x3, fatigue, no acute distress  Cardiovascular: Irregular rhythm,  rate controlled   Respiratory: Decreased breath sounds bibasilar, otherwise clear to auscultation bilaterally  Abdomen: Soft, nontender, nondistended, positive bowel sounds  Musculoskeletal: No clubbing or cyanosis,  2+ pitting edema from the knees down   Data Reviewed: Basic Metabolic Panel:  Recent Labs Lab 05/12/13 2347 05/14/13 0255 05/15/13 0450  NA 136* 134*  136*  K 4.2 4.8 5.1  CL 96 96 96  CO2  --  23 28  GLUCOSE 234* 120* 170*  BUN 39* 43* 42*  CREATININE 1.50* 1.57* 1.55*  CALCIUM  --  8.6 8.5   Liver Function Tests:  Recent Labs Lab 05/12/13 2342 05/14/13 0255  AST 79* 110*  ALT 76* 105*  ALKPHOS 160* 174*  BILITOT 1.9* 1.7*  PROT 7.1 7.2  ALBUMIN 3.1* 3.1*    CBC:  Recent Labs Lab 05/12/13 2342 05/12/13 2347 05/15/13 0450  WBC 7.8  --  8.2  NEUTROABS 5.2  --   --   HGB 11.1* 14.3 11.3*  HCT 38.0* 42.0 39.0  MCV 69.0*  --  69.5*  PLT 254  --  282   Cardiac Enzymes: No results found for this basename: CKTOTAL, CKMB, CKMBINDEX, TROPONINI,  in the last 168 hours BNP (last 3 results)  Recent Labs  04/07/13 0420 05/13/13 0758 05/14/13 1031  PROBNP 3282.0* 2935.0* 1782.0*   CBG:  Recent Labs Lab 05/14/13 1224 05/14/13 1728 05/14/13 2211 05/15/13 0639 05/15/13 1116  GLUCAP 206* 156* 150* 148* 203*    Recent Results (from the past 240 hour(s))  CULTURE, BLOOD (ROUTINE X 2)     Status: None   Collection Time    05/13/13 12:20 AM      Result Value Ref Range Status   Specimen Description BLOOD LEFT ARM   Final   Special Requests BOTTLES DRAWN AEROBIC AND ANAEROBIC 10 CC EACH   Final   Culture  Setup Time     Final   Value: 05/13/2013 04:23     [facility]   Culture     Final   Value:        BLOOD CULTURE RECEIVED NO GROWTH TO DATE CULTURE WILL BE HELD FOR 5 DAYS BEFORE ISSUING A FINAL NEGATIVE REPORT     [facility]formed at [facility]   Report Status PENDING   Incomplete  CULTURE, BLOOD (ROUTINE X 2)     Status: None   Collection Time    05/13/13 12:30 AM      Result Value Ref Range Status   Specimen Description BLOOD LEFT HAND   Final   Special Requests BOTTLES DRAWN AEROBIC ONLY 10 CC   Final   Culture  Setup Time     Final   Value: 05/13/2013 08:13     Performed at [facility]   Culture     Final   Value:        BLOOD CULTURE RECEIVED NO GROWTH TO DATE CULTURE WILL BE HELD FOR 5 DAYS BEFORE ISSUING A FINAL NEGATIVE REPORT     [facility]formed at [facility]   Report Status PENDING   Incomplete  MRSA PCR SCREENING     Status: None   Collection Time    05/13/13  2:14 AM      Result Value Ref Range Status   MRSA by PCR NEGATIVE  NEGATIVE Final   Comment:            The  GeneXpert MRSA Assay (FDA     approved for NASAL specimens     only), is one component of a     comprehensive MRSA colonization     surveillance program. It is not     intended to diagnose MRSA     infection nor to guide or     monitor treatment for     MRSA infections.  Studies: US Abdomen Complete  05/14/2013   CLINICAL DATA:  Elevated INR, elevated LFTs  EXAM: ULTRASOUND ABDOMEN COMPLETE  COMPARISON:  05/17/2012  FINDINGS: Gallbladder:  Well distended without evidence of cholelithiasis. Gallbladder wall thickening to 7 mm is noted. A negative sonographic Eulah Pont sign is seen. The wall thickening may be related to the mild ascites.  Common bile duct:  Diameter: 3.8 mm.  Liver:  Increase in echogenicity consistent with fatty infiltration.  IVC:  No abnormality visualized.  Pancreas:  Not well visualized  Spleen:  Size and appearance within normal limits.  Right Kidney:  Length: 12.2 cm.  A 3.3 cm cyst is noted in the upper pole.  Left Kidney:  Length: 12.3 cm. Echogenicity within normal limits. No mass or hydronephrosis visualized.  Abdominal aorta:  No aneurysm visualized.  Other findings:  Bilateral pleural effusions are identified. Right greater than left. Mild ascites is noted. No sizable pocket to allow for safe paracentesis is noted.  IMPRESSION: Gallbladder wall thickening. This may be related to the minimal ascites. No gallstones are seen.  Bilateral pleural effusions right greater than left.  Minimal ascites.   Electronically Signed   By: Alcide Clever M.D.   On: 05/14/2013 09:20    Scheduled Meds: . atorvastatin  40 mg Oral q1800  . carvedilol  12.5 mg Oral BID WC  . feeding supplement (GLUCERNA SHAKE)  237 mL Oral BID BM  . furosemide  20 mg Intravenous Q12H  . [START ON 05/16/2013] glipiZIDE  2.5 mg Oral QAC breakfast  . insulin aspart  0-5 Units Subcutaneous QHS  . insulin aspart  0-9 Units Subcutaneous TID WC   Continuous Infusions:    Principal Problem:   HCAP  (healthcare-associated pneumonia) Active Problems:   Chronic systolic congestive heart failure   Type 2 diabetes mellitus not at goal   Noncompliance   Atrial fibrillation with RVR   Elevated INR  acute renal failure   Time spent:  35  minutes    Hollice Espy  [facility] Pager [phone]. If 7PM-7AM, please contact night-coverage at [email or web address], password [facility] 05/15/2013, 1:04 PM  LOS: 3 days

## 2013-05-15 NOTE — Progress Notes (Signed)
ANTIBIOTIC CONSULT NOTE - INITIAL  Pharmacy Consult for Levofloxacin Indication: HCAP  No Known Allergies  Patient Measurements: Height: 5\' 7"  (170.2 cm) Weight: 215 lb 6.2 oz (97.7 kg) IBW/kg (Calculated) : 66.1  Vital Signs: Temp: 97.6 F (36.4 C) (02/21 0620) Temp src: Oral (02/21 0620) BP: 142/112 mmHg (02/21 1040) Pulse Rate: 108 (02/21 1040) Intake/Output from previous day: 02/20 0701 - 02/21 0700 In: 1470 [P.O.:1020; IV Piggyback:450] Out: -  Intake/Output from this shift: Total I/O In: 840 [P.O.:840] Out: 125 [Urine:125]  Labs:  Recent Labs  05/12/13 2342 05/12/13 2347 05/14/13 0255 05/15/13 0450  WBC 7.8  --   --  8.2  HGB 11.1* 14.3  --  11.3*  PLT 254  --   --  282  CREATININE  --  1.50* 1.57* 1.55*   Estimated Creatinine Clearance: 59.2 ml/min (by C-G formula based on Cr of 1.55). No results found for this basename: VANCOTROUGH, Leodis Binet, VANCORANDOM, GENTTROUGH, GENTPEAK, GENTRANDOM, TOBRATROUGH, TOBRAPEAK, TOBRARND, AMIKACINPEAK, AMIKACINTROU, AMIKACIN,  in the last 72 hours   Microbiology: Recent Results (from the past 720 hour(s))  CULTURE, BLOOD (ROUTINE X 2)     Status: None   Collection Time    05/13/13 12:20 AM      Result Value Ref Range Status   Specimen Description BLOOD LEFT ARM   Final   Special Requests BOTTLES DRAWN AEROBIC AND ANAEROBIC 10 CC EACH   Final   Culture  Setup Time     Final   Value: 05/13/2013 04:23     Performed at [facility]   Culture     Final   Value:        BLOOD CULTURE RECEIVED NO GROWTH TO DATE CULTURE WILL BE HELD FOR 5 DAYS BEFORE ISSUING A FINAL NEGATIVE REPORT     Performed at [facility]   Report Status PENDING   Incomplete  CULTURE, BLOOD (ROUTINE X 2)     Status: None   Collection Time    05/13/13 12:30 AM      Result Value Ref Range Status   Specimen Description BLOOD LEFT HAND   Final   Special Requests BOTTLES DRAWN AEROBIC ONLY 10 CC   Final   Culture  Setup Time     Final    Value: 05/13/2013 08:13     Performed at [facility]   Culture     Final   Value:        BLOOD CULTURE RECEIVED NO GROWTH TO DATE CULTURE WILL BE HELD FOR 5 DAYS BEFORE ISSUING A FINAL NEGATIVE REPORT     Performed at [facility]   Report Status PENDING   Incomplete  MRSA PCR SCREENING     Status: None   Collection Time    05/13/13  2:14 AM      Result Value Ref Range Status   MRSA by PCR NEGATIVE  NEGATIVE Final   Comment:            The GeneXpert MRSA Assay (FDA     approved for NASAL specimens     only), is one component of a     comprehensive MRSA colonization     surveillance program. It is not     intended to diagnose MRSA     infection nor to guide or     monitor treatment for     MRSA infections.    Medical History: Past Medical History  Diagnosis Date  . Diabetes mellitus   .  Hypertension   . Systolic heart failure     EF is 40-45% by echo, December 2013  . Noncompliance   . CAD (coronary artery disease) Sept 2013    s/p cardiac cath showing occlusion of small RCA with collaterals  . Chronic anticoagulation     on coumadin  . Atrial fibrillation     Medications:  Scheduled:  . atorvastatin  40 mg Oral q1800  . carvedilol  12.5 mg Oral BID WC  . feeding supplement (GLUCERNA SHAKE)  237 mL Oral BID BM  . furosemide  20 mg Intravenous Q12H  . [START ON 05/16/2013] glipiZIDE  2.5 mg Oral QAC breakfast  . insulin aspart  0-5 Units Subcutaneous QHS  . insulin aspart  0-9 Units Subcutaneous TID WC  . rivaroxaban  20 mg Oral QAC supper  . zolpidem  5 mg Oral QHS   Assessment: 58 yo male on antibiotics for HCAP. Pharmacy has been consulted to dose levofloxacin. Patients renal function is stable with CrCl ~ 6360mL/min. Afebrile, WBC wnl.   Vanc 2/19 >> 2/21 Cefepime 2/19 >> 2/21 Levofloxacin 2/21  2/19 bld x2-ngtd  Goal of Therapy:  Eradication of infection  Plan:  - Start levofloxacin PO 750mg  q24h - Monitor renal function, CX, WBC,  and temp  Shaquitta Burbridge A. Lenon AhmadiBinz, PharmD Clinical Pharmacist - Resident Pager: [phone] Pharmacy: [phone] 05/15/2013 1:42 PM

## 2013-05-15 NOTE — Progress Notes (Signed)
Pt. Verbalized he was upset that he was only getting 20 Mg of p.o. Lasix and felt he needed 40 p.o. Lasix. I advised pt. That the doctor had already had an order for 40 mg of p.o. Lasix that I had already to give him. Pt. Understood and it was further explained to him by M.D. Who came in to talk to pt. About his Lasix while I was in the room. Pt. Felt better about his Lasix dosage but wanted to also get something for sleeping. MD ordered Ambien for sleep at night and pt. Understood.

## 2013-05-15 NOTE — Progress Notes (Signed)
Pt. Was upset that he was given new order of 20 mg. Of I.V. Lasix ordered by MD in addition to his 40 mg P.O. He took earlier. It was explained to Pt. That the 20 mg of I.V. Lasix is as effective as the 40 mg P.O. But pt. Still upset and wanted to speak with MD. MD was notified of pt.'s concerns and medication was changed back to 40 mg P.O. Lasix beginning on tomorrow. Pt. Understood. It was also explained to pt. That keeping his feet elevated will also help with his BLE Edema. Pt. Understood.

## 2013-05-16 DIAGNOSIS — I5043 Acute on chronic combined systolic (congestive) and diastolic (congestive) heart failure: Secondary | ICD-10-CM

## 2013-05-16 LAB — GLUCOSE, CAPILLARY
Glucose-Capillary: 176 mg/dL — ABNORMAL HIGH (ref 70–99)
Glucose-Capillary: 189 mg/dL — ABNORMAL HIGH (ref 70–99)
Glucose-Capillary: 143 mg/dL — ABNORMAL HIGH (ref 70–99)
Glucose-Capillary: 138 mg/dL — ABNORMAL HIGH (ref 70–99)

## 2013-05-16 LAB — BASIC METABOLIC PANEL
BUN: 34 mg/dL — AB (ref 6–23)
CALCIUM: 8.3 mg/dL — AB (ref 8.4–10.5)
CO2: 29 meq/L (ref 19–32)
Chloride: 96 mEq/L (ref 96–112)
Creatinine, Ser: 1.16 mg/dL (ref 0.50–1.35)
GFR calc Af Amer: 80 mL/min — ABNORMAL LOW (ref >90)
GFR calc non Af Amer: 69 mL/min — ABNORMAL LOW (ref >90)
GLUCOSE: 134 mg/dL — AB (ref 70–99)
Potassium: 4.8 mEq/L (ref 3.7–5.3)
SODIUM: 136 meq/L — AB (ref 137–147)

## 2013-05-16 LAB — VANCOMYCIN, TROUGH: Vancomycin Tr: 23.2 ug/mL — ABNORMAL HIGH (ref 10.0–20.0)

## 2013-05-16 LAB — PROTIME-INR
INR: 4.5 — AB (ref 0.00–1.49)
Prothrombin Time: 41 seconds — ABNORMAL HIGH (ref 11.6–15.2)

## 2013-05-16 MED ORDER — FUROSEMIDE 20 MG PO TABS
20.0000 mg | ORAL_TABLET | ORAL | Status: AC
  Administered 2013-05-16: 20 mg via ORAL
  Filled 2013-05-16: qty 1

## 2013-05-16 MED ORDER — INSULIN ASPART 100 UNIT/ML ~~LOC~~ SOLN
0.0000 [IU] | Freq: Three times a day (TID) | SUBCUTANEOUS | Status: DC
  Administered 2013-05-16 – 2013-05-17 (×2): 2 [IU] via SUBCUTANEOUS
  Administered 2013-05-17 – 2013-05-18 (×2): 3 [IU] via SUBCUTANEOUS
  Administered 2013-05-18: 2 [IU] via SUBCUTANEOUS
  Administered 2013-05-19: 5 [IU] via SUBCUTANEOUS
  Administered 2013-05-19: 3 [IU] via SUBCUTANEOUS

## 2013-05-16 MED ORDER — INSULIN ASPART 100 UNIT/ML ~~LOC~~ SOLN: 0.0000 [IU] | Freq: Every day | SUBCUTANEOUS | Status: DC

## 2013-05-16 MED ORDER — FUROSEMIDE 10 MG/ML IJ SOLN
40.0000 mg | Freq: Two times a day (BID) | INTRAMUSCULAR | Status: DC
  Administered 2013-05-16 – 2013-05-18 (×4): 40 mg via INTRAVENOUS
  Filled 2013-05-16 (×4): qty 4

## 2013-05-16 MED ORDER — GLIPIZIDE 5 MG PO TABS
5.0000 mg | ORAL_TABLET | Freq: Every day | ORAL | Status: DC
  Administered 2013-05-17 – 2013-05-19 (×3): 5 mg via ORAL
  Filled 2013-05-16 (×5): qty 1

## 2013-05-16 MED ORDER — CARVEDILOL 25 MG PO TABS
25.0000 mg | ORAL_TABLET | Freq: Two times a day (BID) | ORAL | Status: DC
  Administered 2013-05-17 – 2013-05-19 (×6): 25 mg via ORAL
  Filled 2013-05-16 (×7): qty 1

## 2013-05-16 NOTE — Progress Notes (Addendum)
[facility] PROGRESS NOTE  Silvan Feider AOZ:[id number] DOB: December 17, 1955 DOA: 05/12/2013 PCP: No PCP Per Patient  Assessment/Plan: Principal Problem:   HCAP (healthcare-associated pneumonia): Initially, On vancomycin and cefepime. To limit IV fluids, will change to Levaquin tablet.   Active Problems:   Acute on Chronic systolic congestive heart failure: Interestingly, patient never did make followup appointment.  Weight now up 36 pounds after IV fluids and Cardizem drip plus IV antibiotics. Minimal response to by mouth Lasix so we'll change to IV. Watch renal function .  Increase Lasix to 40 mg IV twice a day.     patient down 2 pounds from yesterday Type 2 diabetes mellitus not at goal: Checking A1c again. Patient did not fill prescription for metformin. However, question metformin would be the best medication patient with poor ejection fraction.  CBG still elevated. Started glipizide and will increase to 5 mg daily and increase sliding scale to moderate    Noncompliance: Patient remains noncompliant. He did not take any of his medications. He is able to financially afford his cigarettes. Will need to have extensive discussions with this patient.    Atrial fibrillation with RVR: Off Cardizem drip on by mouth metoprolol.  Heart rate up, likely from volume overload. Changed over to Coreg , tolerating well, increased dose     Elevated INR: Unclear etiology. Discussed with pharmacy. INR can be elevated in patient on Xarelto, however patient has not taken Xarelto since last discharge 5 weeks ago. He is not on Coumadin. Even in the setting of heart failure with hepatic congestion, this would not necessarily explain this. Patient denies alcohol abuse. He does have some fatty infiltration of the liver.  with Xarelto restarted,  INR steadily rising up to 6.4  in discussion pharmacy, they feel that the Xarelto can affect the INR, however it is not an indication marker for his bleeding. The patient is to  continue Xarelto stopped checking INR.   acute renal failure:  initially elevated soon after admission , likely from dehydration versus decreased perfusion from congestive heart failure. I do not think patient has been taking Lasix.  overall unchanged in the last few days. Likely this is his baseline   Tobacco abuse: Will offer nicotine patch  Code Status: Full code Family Communication: Pt asked me to call son, but number he gave me does not take in coming calls.  Disposition Plan:   Here until fully diuresed    Consultants:  None  Procedures:  None  Antibiotics:  IV vancomycin and Zosyn, 2/19-2/21  Levaquin by mouth: 2/21-present  HPI/Subjective: Feels very volume overloaded. Complains of legs being swollen. Dyspnea on exertion.  Objective: Filed Vitals:   05/16/13 1514  BP: 110/91  Pulse: 116  Temp: 98.1 F (36.7 C)  Resp: 20    Intake/Output Summary (Last 24 hours) at 05/16/13 1550 Last data filed at 05/16/13 1514  Gross per 24 hour  Intake    720 ml  Output    500 ml  Net    220 ml   Filed Weights   05/14/13 1043 05/15/13 0620 05/16/13 0545  Weight: 97.796 kg (215 lb 9.6 oz) 97.7 kg (215 lb 6.2 oz) 96.979 kg (213 lb 12.8 oz)    Exam:   General:  Alert and oriented x3, fatigue, no acute distress  Cardiovascular: Irregular rhythm,   borderline tachycardia   Respiratory: Decreased breath sounds bibasilar, otherwise clear to auscultation bilaterally  Abdomen: Soft, nontender, nondistended, positive bowel sounds  Musculoskeletal: No clubbing or  cyanosis,  2+ pitting edema from the knees down   Data Reviewed: Basic Metabolic Panel:  Recent Labs Lab 05/12/13 2347 05/14/13 0255 05/15/13 0450  NA 136* 134* 136*  K 4.2 4.8 5.1  CL 96 96 96  CO2  --  23 28  GLUCOSE 234* 120* 170*  BUN 39* 43* 42*  CREATININE 1.50* 1.57* 1.55*  CALCIUM  --  8.6 8.5   Liver Function Tests:  Recent Labs Lab 05/12/13 2342 05/14/13 0255  AST 79* 110*  ALT  76* 105*  ALKPHOS 160* 174*  BILITOT 1.9* 1.7*  PROT 7.1 7.2  ALBUMIN 3.1* 3.1*   CBC:  Recent Labs Lab 05/12/13 2342 05/12/13 2347 05/15/13 0450  WBC 7.8  --  8.2  NEUTROABS 5.2  --   --   HGB 11.1* 14.3 11.3*  HCT 38.0* 42.0 39.0  MCV 69.0*  --  69.5*  PLT 254  --  282   Cardiac Enzymes: No results found for this basename: CKTOTAL, CKMB, CKMBINDEX, TROPONINI,  in the last 168 hours BNP (last 3 results)  Recent Labs  04/07/13 0420 05/13/13 0758 05/14/13 1031  PROBNP 3282.0* 2935.0* 1782.0*   CBG:  Recent Labs Lab 05/15/13 1116 05/15/13 1633 05/15/13 2125 05/16/13 0603 05/16/13 1131  GLUCAP 203* 158* 190* 176* 189*    Recent Results (from the past 240 hour(s))  CULTURE, BLOOD (ROUTINE X 2)     Status: None   Collection Time    05/13/13 12:20 AM      Result Value Ref Range Status   Specimen Description BLOOD LEFT ARM   Final   Special Requests BOTTLES DRAWN AEROBIC AND ANAEROBIC 10 CC EACH   Final   Culture  Setup Time     Final   Value: 05/13/2013 04:23     [facility]   Culture     Final   Value:        BLOOD CULTURE RECEIVED NO GROWTH TO DATE CULTURE WILL BE HELD FOR 5 DAYS BEFORE ISSUING A FINAL NEGATIVE REPORT     [facility]formed at [facility]   Report Status PENDING   Incomplete  CULTURE, BLOOD (ROUTINE X 2)     Status: None   Collection Time    05/13/13 12:30 AM      Result Value Ref Range Status   Specimen Description BLOOD LEFT HAND   Final   Special Requests BOTTLES DRAWN AEROBIC ONLY 10 CC   Final   Culture  Setup Time     Final   Value: 05/13/2013 08:13     Performed at [facility]   Culture     Final   Value:        BLOOD CULTURE RECEIVED NO GROWTH TO DATE CULTURE WILL BE HELD FOR 5 DAYS BEFORE ISSUING A FINAL NEGATIVE REPORT     [facility]formed at [facility]   Report Status PENDING   Incomplete  MRSA PCR SCREENING     Status: None   Collection Time    05/13/13  2:14 AM      Result Value Ref  Range Status   MRSA by PCR NEGATIVE  NEGATIVE Final   Comment:            The GeneXpert MRSA Assay (FDA     approved for NASAL specimens     only), is one component of a     comprehensive MRSA colonization     surveillance program. It is not  intended to diagnose MRSA     infection nor to guide or     monitor treatment for     MRSA infections.     Studies: No results found.  Scheduled Meds: . atorvastatin  40 mg Oral q1800  . [START ON 05/17/2013] carvedilol  25 mg Oral BID WC  . feeding supplement (GLUCERNA SHAKE)  237 mL Oral BID BM  . furosemide  40 mg Intravenous Q12H  . glipiZIDE  2.5 mg Oral QAC breakfast  . insulin aspart  0-5 Units Subcutaneous QHS  . insulin aspart  0-9 Units Subcutaneous TID WC  . levofloxacin  750 mg Oral Daily  . rivaroxaban  20 mg Oral QAC supper  . zolpidem  5 mg Oral QHS   Continuous Infusions:    Principal Problem:   HCAP (healthcare-associated pneumonia) Active Problems:   Chronic systolic congestive heart failure   Type 2 diabetes mellitus not at goal   Noncompliance   Atrial fibrillation with RVR   Elevated INR  acute renal failure   Time spent:   25 minutes    Hollice Espy  [facility] Pager [phone]. If 7PM-7AM, please contact night-coverage at [email or web address], password [facility] 05/16/2013, 3:50 PM  LOS: 4 days

## 2013-05-16 NOTE — Progress Notes (Signed)
ANTICOAGULATION CONSULT NOTE - Follow-up Consult  Pharmacy Consult for Xarelto Indication: atrial fibrillation  No Known Allergies  Patient Measurements: Height: 5\' 7"  (170.2 cm) Weight: 213 lb 12.8 oz (96.979 kg) (Scale B) IBW/kg (Calculated) : 66.1  Vital Signs: Temp: 97.4 F (36.3 C) (02/22 0545) Temp src: Oral (02/22 0545) BP: 127/55 mmHg (02/22 0600) Pulse Rate: 50 (02/22 0545)  Labs:  Recent Labs  05/14/13 0255 05/14/13 1031 05/15/13 0450 05/16/13 0540  HGB  --   --  11.3*  --   HCT  --   --  39.0  --   PLT  --   --  282  --   LABPROT  --  43.6* 52.5* 41.0*  INR  --  4.88* 6.23* 4.50*  CREATININE 1.57*  --  1.55*  --     Estimated Creatinine Clearance: 59.1 ml/min (by C-G formula based on Cr of 1.55).   Medical History: Past Medical History  Diagnosis Date  . Diabetes mellitus   . Hypertension   . Systolic heart failure     EF is 40-45% by echo, December 2013  . Noncompliance   . CAD (coronary artery disease) Sept 2013    s/p cardiac cath showing occlusion of small RCA with collaterals  . Chronic anticoagulation     on coumadin  . Atrial fibrillation     Medications:  Noncompliant with medications  Assessment: 58 year old male with history of afib on chronic coumadin and then recently switched to Xarelto. Through multiple conversations it has been established that he is not taking any of his medications including warfarin or xarelto. He continues to be in afib, orders were placed for Xarelto per pharmacy. Of note his INR continues to be elevated this AM for unknown cause; however, it is starting to trend down (4.5 on 2/22). MD decided to continue Xarelto after speaking with another pharmacist.   Goal of Therapy:  Monitor platelets by anticoagulation protocol: Yes   Plan:  - Continue Xarelto PO 20mg  daily - Monitor for H/H and s/s bleeding  Latoi Giraldo A. Lenon Ahmadi, PharmD Clinical Pharmacist - Resident Pager: [phone] Pharmacy:  [phone] 05/16/2013 9:24 AM

## 2013-05-17 DIAGNOSIS — F17200 Nicotine dependence, unspecified, uncomplicated: Secondary | ICD-10-CM

## 2013-05-17 LAB — BASIC METABOLIC PANEL WITH GFR
BUN: 30 mg/dL — ABNORMAL HIGH (ref 6–23)
CO2: 28 meq/L (ref 19–32)
Calcium: 8.4 mg/dL (ref 8.4–10.5)
Chloride: 98 meq/L (ref 96–112)
Creatinine, Ser: 1.01 mg/dL (ref 0.50–1.35)
GFR calc Af Amer: >90 mL/min
GFR calc non Af Amer: 81 mL/min — ABNORMAL LOW
Glucose, Bld: 144 mg/dL — ABNORMAL HIGH (ref 70–99)
Potassium: 5 meq/L (ref 3.7–5.3)
Sodium: 137 meq/L (ref 137–147)

## 2013-05-17 LAB — GLUCOSE, CAPILLARY
Glucose-Capillary: 142 mg/dL — ABNORMAL HIGH (ref 70–99)
Glucose-Capillary: 159 mg/dL — ABNORMAL HIGH (ref 70–99)
Glucose-Capillary: 105 mg/dL — ABNORMAL HIGH (ref 70–99)
Glucose-Capillary: 182 mg/dL — ABNORMAL HIGH (ref 70–99)

## 2013-05-17 NOTE — Progress Notes (Signed)
[facility] PROGRESS NOTE  Jacob [name] NHA:[id number] DOB: Mar 26, 1955 DOA: 05/12/2013 PCP: No PCP Per Patient  Assessment/Plan: Principal Problem:   HCAP (healthcare-associated pneumonia): Initially, On vancomycin and cefepime. To limit IV fluids, will change to Levaquin tablet.   Active Problems:   Acute on Chronic systolic congestive heart failure: Interestingly, patient never did make followup appointment.  Weight now up 36 pounds after IV fluids and Cardizem drip plus IV antibiotics. Minimal response to by mouth Lasix so increased to Lasix to 40 mg IV twice a day. Patient down 7 pounds from heaviest    patient down 2 pounds from yesterday Type 2 diabetes mellitus not at goal: Checking A1c again. Patient did not fill prescription for metformin. However, question metformin would be the best medication patient with poor ejection fraction.  CBG still elevated. Started glipizide and will increase to 5 mg daily and increase sliding scale to moderate CBGs responding well.     Noncompliance: Patient remains noncompliant. He did not take any of his medications. He is able to financially afford his cigarettes. Will need to have extensive discussions with this patient.    Atrial fibrillation with RVR: Off Cardizem drip on by mouth metoprolol.  Heart rate up, likely from volume overload. Changed over to Coreg , tolerating well, increased dose     Elevated INR: Unclear etiology. Discussed with pharmacy. INR can be elevated in patient on Xarelto, however patient has not taken Xarelto since last discharge 5 weeks ago. He is not on Coumadin. Even in the setting of heart failure with hepatic congestion, this would not necessarily explain this. Patient denies alcohol abuse. He does have some fatty infiltration of the liver.  with Xarelto restarted,  INR steadily rising up to 6.4  in discussion pharmacy, they feel that the Xarelto can affect the INR, however it is not an indication marker for his bleeding.  The patient is to continue Xarelto stopped checking INR.   acute renal failure:  initially elevated soon after admission , likely from dehydration versus decreased perfusion from congestive heart failure. I do not think patient has been taking Lasix.  overall unchanged in the last few days. Likely this is his baseline   Tobacco abuse: Will offer nicotine patch  Code Status: Full code Family Communication: Pt asked me to call son, but number he gave me does not take in coming calls.  Disposition Plan:   Here until fully diuresed    Consultants:  None  Procedures:  None  Antibiotics:  IV vancomycin and Zosyn, 2/19-2/21  Levaquin by mouth: 2/21-present  HPI/Subjective: Feeling a little bit better. Less swelling  Objective: Filed Vitals:   05/17/13 0548  BP: 124/79  Pulse: 119  Temp: 97.6 F (36.4 C)  Resp: 20    Intake/Output Summary (Last 24 hours) at 05/17/13 1330 Last data filed at 05/17/13 0831  Gross per 24 hour  Intake    720 ml  Output   1801 ml  Net  -1081 ml   Filed Weights   05/15/13 0620 05/16/13 0545 05/17/13 0548  Weight: 97.7 kg (215 lb 6.2 oz) 96.979 kg (213 lb 12.8 oz) 94.53 kg (208 lb 6.4 oz)    Exam:   General:  Resting comfortably, no acute distress  Cardiovascular: Irregular rhythm,   borderline tachycardia   Respiratory: Decreased breath sounds bibasilar, otherwise clear to auscultation bilaterally  Abdomen: Soft, nontender, nondistended, positive bowel sounds  Musculoskeletal: No clubbing or cyanosis,  2+ pitting edema from the knees down  Data Reviewed: Basic Metabolic Panel:  Recent Labs Lab 05/12/13 2347 05/14/13 0255 05/15/13 0450 05/16/13 1610 05/17/13 0556  NA 136* 134* 136* 136* 137  [name] 4.2 4.8 5.1 4.8 5.0  CL 96 96 96 96 98  CO2  --  23 28 29 28   GLUCOSE 234* 120* 170* 134* 144*  BUN 39* 43* 42* 34* 30*  CREATININE 1.50* 1.57* 1.55* 1.16 1.01  CALCIUM  --  8.6 8.5 8.3* 8.4   Liver Function Tests:  Recent  Labs Lab 05/12/13 2342 05/14/13 0255  AST 79* 110*  ALT 76* 105*  ALKPHOS 160* 174*  BILITOT 1.9* 1.7*  PROT 7.1 7.2  ALBUMIN 3.1* 3.1*   CBC:  Recent Labs Lab 05/12/13 2342 05/12/13 2347 05/15/13 0450  WBC 7.8  --  8.2  NEUTROABS 5.2  --   --   HGB 11.1* 14.3 11.3*  HCT 38.0* 42.0 39.0  MCV 69.0*  --  69.5*  PLT 254  --  282   Cardiac Enzymes: No results found for this basename: CKTOTAL, CKMB, CKMBINDEX, TROPONINI,  in the last 168 hours BNP (last 3 results)  Recent Labs  04/07/13 0420 05/13/13 0758 05/14/13 1031  PROBNP 3282.0* 2935.0* 1782.0*   CBG:  Recent Labs Lab 05/16/13 1131 05/16/13 1624 05/16/13 2132 05/17/13 0633 05/17/13 1149  GLUCAP 189* 143* 138* 142* 159*    Recent Results (from the past 240 hour(s))  CULTURE, BLOOD (ROUTINE X 2)     Status: None   Collection Time    05/13/13 12:20 AM      Result Value Ref Range Status   Specimen Description BLOOD LEFT ARM   Final   Special Requests BOTTLES DRAWN AEROBIC AND ANAEROBIC 10 CC EACH   Final   Culture  Setup Time     Final   Value: 05/13/2013 04:23     Performed at [facility]   Culture     Final   Value:        BLOOD CULTURE RECEIVED NO GROWTH TO DATE CULTURE WILL BE HELD FOR 5 DAYS BEFORE ISSUING A FINAL NEGATIVE REPORT     Performed at [facility]   Report Status PENDING   Incomplete  CULTURE, BLOOD (ROUTINE X 2)     Status: None   Collection Time    05/13/13 12:30 AM      Result Value Ref Range Status   Specimen Description BLOOD LEFT HAND   Final   Special Requests BOTTLES DRAWN AEROBIC ONLY 10 CC   Final   Culture  Setup Time     Final   Value: 05/13/2013 08:13     Performed at [facility]   Culture     Final   Value:        BLOOD CULTURE RECEIVED NO GROWTH TO DATE CULTURE WILL BE HELD FOR 5 DAYS BEFORE ISSUING A FINAL NEGATIVE REPORT     Performed at [facility]   Report Status PENDING   Incomplete  MRSA PCR SCREENING     Status: None    Collection Time    05/13/13  2:14 AM      Result Value Ref Range Status   MRSA by PCR NEGATIVE  NEGATIVE Final   Comment:            The GeneXpert MRSA Assay (FDA     approved for NASAL specimens     only), is one component of a     comprehensive MRSA colonization  surveillance program. It is not     intended to diagnose MRSA     infection nor to guide or     monitor treatment for     MRSA infections.     Studies: No results found.  Scheduled Meds: . atorvastatin  40 mg Oral q1800  . carvedilol  25 mg Oral BID WC  . feeding supplement (GLUCERNA SHAKE)  237 mL Oral BID BM  . furosemide  40 mg Intravenous Q12H  . glipiZIDE  5 mg Oral QAC breakfast  . insulin aspart  0-15 Units Subcutaneous TID WC  . insulin aspart  0-5 Units Subcutaneous QHS  . levofloxacin  750 mg Oral Daily  . rivaroxaban  20 mg Oral QAC supper  . zolpidem  5 mg Oral QHS   Continuous Infusions:    Principal Problem:   HCAP (healthcare-associated pneumonia) Active Problems:   Chronic systolic congestive heart failure   Type 2 diabetes mellitus not at goal   Noncompliance   Atrial fibrillation with RVR   Elevated INR  acute renal failure   Time spent:   20 minutes    Jacob [name],Jacob [name]  [facility] Pager [phone]. If 7PM-7AM, please contact night-coverage at [email or web address], password [email or web address] 05/17/2013, 1:30 PM  LOS: 5 days

## 2013-05-18 LAB — BASIC METABOLIC PANEL
BUN: 30 mg/dL — AB (ref 6–23)
CO2: 31 mEq/L (ref 19–32)
Calcium: 8.1 mg/dL — ABNORMAL LOW (ref 8.4–10.5)
Chloride: 95 mEq/L — ABNORMAL LOW (ref 96–112)
Creatinine, Ser: 1.09 mg/dL (ref 0.50–1.35)
GFR calc Af Amer: 86 mL/min — ABNORMAL LOW (ref >90)
GFR, EST NON AFRICAN AMERICAN: 74 mL/min — AB (ref >90)
GLUCOSE: 148 mg/dL — AB (ref 70–99)
Potassium: 4.1 mEq/L (ref 3.7–5.3)
Sodium: 137 mEq/L (ref 137–147)

## 2013-05-18 LAB — GLUCOSE, CAPILLARY
Glucose-Capillary: 137 mg/dL — ABNORMAL HIGH (ref 70–99)
Glucose-Capillary: 112 mg/dL — ABNORMAL HIGH (ref 70–99)
Glucose-Capillary: 157 mg/dL — ABNORMAL HIGH (ref 70–99)
Glucose-Capillary: 153 mg/dL — ABNORMAL HIGH (ref 70–99)

## 2013-05-18 MED ORDER — DILTIAZEM HCL ER 60 MG PO CP12
60.0000 mg | ORAL_CAPSULE | Freq: Two times a day (BID) | ORAL | Status: DC
  Administered 2013-05-18 – 2013-05-19 (×3): 60 mg via ORAL
  Filled 2013-05-18 (×5): qty 1

## 2013-05-18 MED ORDER — FUROSEMIDE 10 MG/ML IJ SOLN
60.0000 mg | Freq: Two times a day (BID) | INTRAMUSCULAR | Status: DC
  Administered 2013-05-18 – 2013-05-19 (×3): 60 mg via INTRAVENOUS
  Filled 2013-05-18 (×3): qty 6

## 2013-05-18 NOTE — Care Management Note (Addendum)
    Page 1 of 1   05/19/2013     4:41:58 PM   CARE MANAGEMENT NOTE 05/19/2013  Patient:  Jacob [name], Jacob [name]   Account Number:  [id number]  Date Initiated:  05/18/2013  Documentation initiated by:  Oletta Cohn  Subjective/Objective Assessment:   58 y.o. male who presents to the ED with chest pain.//Home with friends     Action/Plan:   IV diureses//Home with self care   Anticipated DC Date:  05/18/2013   Anticipated DC Plan:  HOME/SELF CARE  In-house referral  Clinical Social Worker      DC Planning Services  CM consult      Choice offered to / List presented to:             Status of service:   Medicare Important Message given?   (If response is "NO", the following Medicare IM given date fields will be blank) Date Medicare IM given:   Date Additional Medicare IM given:    Discharge Disposition:    Per UR Regulation:    If discussed at Long Length of Stay Meetings, dates discussed:   05/18/2013    Comments:  05/19/13 1615 Oletta Cohn, RN, BSN, NCM [phone] Spoke with pharmacist at [facility] to obtain medications for pt. Informed that essential medications could be filled; pt needs to get them by 6:00pm and make appt for follow-up and orange card application.  Pt assures NCM that he will follow steps and have prescriptions filled promptly. Pt is recepient of [employer] project for housing and food assistance.  05/18/13 1030 Annalyce Lanpher, RN, BSN, [place] [phone] Pt last discharge 04/07/13; has appt with [facility] on 1/22 of which he did not go; when asked why, pt only recourse is that he promises he will go to next appt... Will continue to follow for additional discharge needs.  Pt possible canididate for [employer] project- Lovette Cliche, LCSW to provide information to pt.

## 2013-05-18 NOTE — Progress Notes (Signed)
ANTIBIOTIC CONSULT NOTE - FOLLOW UP  Pharmacy Consult for Levaquin Indication: pneumonia No Known Allergies Patient Measurements: Height: 5\' 7"  (170.2 cm) Weight: 206 lb 4.8 oz (93.577 kg) (b scale) IBW/kg (Calculated) : 66.1 Vital Signs: Temp: 97.8 F (36.6 C) (02/24 0624) Temp src: Oral (02/24 0624) BP: 115/77 mmHg (02/24 0624) Pulse Rate: 107 (02/24 0624) Intake/Output from previous day: 02/23 0701 - 02/24 0700 In: 920 [P.O.:920] Out: 1002 [Urine:1002] Intake/Output from this shift: Total I/O In: -  Out: 700 [Urine:700] Labs:  Recent Labs  05/16/13 1610 05/17/13 0556 05/18/13 0400  CREATININE 1.16 1.01 1.09   Estimated Creatinine Clearance: 82.5 ml/min (by C-G formula based on Cr of 1.09).  Recent Labs  05/15/13 2332  VANCOTROUGH 23.2*    Microbiology: 2/19 Blood cultures >> ngtd 2/19 MRSA PCR >> negative  Anti-infectives   Start     Dose/Rate Route Frequency Ordered Stop   05/15/13 1500  levofloxacin (LEVAQUIN) tablet 750 mg     750 mg Oral Daily 05/15/13 1343     05/13/13 1200  vancomycin (VANCOCIN) IVPB 750 mg/150 ml premix  Status:  Discontinued     750 mg 150 mL/hr over 60 Minutes Intravenous Every 12 hours 05/13/13 0050 05/15/13 1303   05/13/13 0600  ceFEPIme (MAXIPIME) 1 g in dextrose 5 % 50 mL IVPB  Status:  Discontinued     1 g 100 mL/hr over 30 Minutes Intravenous 3 times per day 05/13/13 0044 05/15/13 1303   05/13/13 0015  vancomycin (VANCOCIN) IVPB 1000 mg/200 mL premix     1,000 mg 200 mL/hr over 60 Minutes Intravenous  Once 05/13/13 0005 05/13/13 0102   05/13/13 0015  piperacillin-tazobactam (ZOSYN) IVPB 3.375 g     3.375 g 100 mL/hr over 30 Minutes Intravenous  Once 05/13/13 0006 05/13/13 0102      Assessment: 57 YOM on Levaquin day #4 (total antibiotic day #6) for pneumonia. Cultures remain negative. WBC is wnl. Afebrile. SCr is stable.   Goal of Therapy:  Clinical resolution of infection  Plan:  1. Continue Levaquin 750mg  po  q24h.  2. Pharmacy will sign off as renal function is stable.  Note to MD: Recommend adding stop day of 2/25 or 2/26 for total of 7-8 days.   Link Snuffer, PharmD, BCPS Clinical Pharmacist [phone] 05/18/2013,10:48 AM

## 2013-05-18 NOTE — Progress Notes (Signed)
[facility] PROGRESS NOTE  Jacob [name] SAY:[id number] DOB: 1955-05-18 DOA: 05/12/2013 PCP: No PCP Per Patient  Interim summary:  Patient is a 58 year old Arabic male with past medical history of congestive heart failure, diabetes mellitus and atrial fibrillation-supposed to be on anticoagulation who following a recent hospitalization was supposed to go to [facility] to receive free medications, including Xarelto. Patient presented on 2/18 the evening with chest pain pleuritic in nature and was found to be in atrial fibrillation with rapid ventricular rate, pneumonia and elevated blood sugars.  Patient was admitted, started on IV antibiotics and treated as a healthcare associated pneumonia, anticoagulation and Cardizem drip. The next few days following, he was weaned off the Cardizem drip and transferred to the floor. In the process, his weight went up 15 pounds. Currently, he is being diuresed and is down 9 pounds with another 16 to go.  Assessment/Plan: Principal Problem:   HCAP (healthcare-associated pneumonia): Initially, On vancomycin and cefepime. To limit IV fluids, changed to Levaquin tablet, last dose tomorrow.  Active Problems:   Acute on Chronic systolic congestive heart failure: Interestingly, patient never did make followup appointment.  Weight now up 36 pounds after IV fluids and Cardizem drip plus IV antibiotics. Minimal response to by mouth Lasix so increased to Lasix to 40 mg IV twice a day. Down 9 pounds. Renal function remained stable so will have increased Lasix to 60 IV Q. 12 hours.  Type 2 diabetes mellitus not at goal: A1c at 7.7. Patient did not fill prescription for metformin. However, question metformin would be the best medication patient with poor ejection fraction.  CBG still elevated. Started glipizide and will increase to 5 mg daily and increase sliding scale to moderate with CBGs responding well.     Noncompliance: Patient remains  noncompliant. He did not take any of his medications. He is able to financially afford his cigarettes. He had extensive discussions and patient will followup with outpatient clinic. Case management involved    Atrial fibrillation with RVR: Off Cardizem drip on by mouth metoprolol.  Heart rate up, likely from volume overload. Changed over to Coreg and then increase dose to 25 twice a day on 2/23. Heart rate sustains above 100, so an added by mouth Cardizem today. As he further diuresis, this should also help. See below in regards to anticoagulation    Elevated INR: Unclear etiology. Discussed with pharmacy. INR can be elevated in patient on Xarelto, however patient has not taken Xarelto since last discharge 5 weeks ago. He is not on Coumadin. Even in the setting of heart failure with hepatic congestion, this would not necessarily explain this. Patient denies alcohol abuse. He does have some fatty infiltration of the liver.  with Xarelto restarted,  INR steadily rising up to 6.4  in discussion pharmacy, they feel that the Xarelto can affect the INR, however it is not an indication marker for his bleeding. The patient is to continue Xarelto and we have checking INR.   acute renal failure:  initially elevated soon after admission , likely from dehydration versus decreased perfusion from congestive heart failure. Following Lasix administration,, perfusion is better and creatinine is at 1.09  Tobacco abuse: Will offer nicotine patch  Code Status: Full code Family Communication: Pt asked me to call son, but number he gave me does not take in coming calls.  Disposition Plan:   Here until fully diuresed    Consultants:  None  Procedures:  None  Antibiotics:  IV vancomycin and Zosyn, 2/19-2/21  Levaquin by mouth: 2/21-2/25  HPI/Subjective: Continues to feel a bit better. Decreased swelling.  Objective: Filed Vitals:   05/18/13 0624  BP: 115/77  Pulse: 107  Temp: 97.8 F (36.6 C)  Resp:  20    Intake/Output Summary (Last 24 hours) at 05/18/13 1351 Last data filed at 05/18/13 1145  Gross per 24 hour  Intake    920 ml  Output   1502 ml  Net   -582 ml   Filed Weights   05/16/13 0545 05/17/13 0548 05/18/13 0624  Weight: 96.979 kg (213 lb 12.8 oz) 94.53 kg (208 lb 6.4 oz) 93.577 kg (206 lb 4.8 oz)    Exam:   General:  Resting comfortably, no acute distress  Cardiovascular: Irregular rhythm,   borderline tachycardia   Respiratory: Decreased breath sounds bibasilar, otherwise clear to auscultation bilaterally  Abdomen: Soft, nontender, nondistended, positive bowel sounds  Musculoskeletal: No clubbing or cyanosis, 1+ pitting edema from the knees down. Data Reviewed: Basic Metabolic Panel:  Recent Labs Lab 05/14/13 0255 05/15/13 0450 05/16/13 1610 05/17/13 0556 05/18/13 0400  NA 134* 136* 136* 137 137  K 4.8 5.1 4.8 5.0 4.1  CL 96 96 96 98 95*  CO2 23 28 29 28 31   GLUCOSE 120* 170* 134* 144* 148*  BUN 43* 42* 34* 30* 30*  CREATININE 1.57* 1.55* 1.16 1.01 1.09  CALCIUM 8.6 8.5 8.3* 8.4 8.1*   Liver Function Tests:  Recent Labs Lab 05/12/13 2342 05/14/13 0255  AST 79* 110*  ALT 76* 105*  ALKPHOS 160* 174*  BILITOT 1.9* 1.7*  PROT 7.1 7.2  ALBUMIN 3.1* 3.1*   CBC:  Recent Labs Lab 05/12/13 2342 05/12/13 2347 05/15/13 0450  WBC 7.8  --  8.2  NEUTROABS 5.2  --   --   HGB 11.1* 14.3 11.3*  HCT 38.0* 42.0 39.0  MCV 69.0*  --  69.5*  PLT 254  --  282   Cardiac Enzymes: No results found for this basename: CKTOTAL, CKMB, CKMBINDEX, TROPONINI,  in the last 168 hours BNP (last 3 results)  Recent Labs  04/07/13 0420 05/13/13 0758 05/14/13 1031  PROBNP 3282.0* 2935.0* 1782.0*   CBG:  Recent Labs Lab 05/17/13 1149 05/17/13 1614 05/17/13 2137 05/18/13 0540 05/18/13 1147  GLUCAP 159* 105* 182* 137* 112*    Recent Results (from the past 240 hour(s))  CULTURE, BLOOD (ROUTINE X 2)     Status: None   Collection Time    05/13/13  12:20 AM      Result Value Ref Range Status   Specimen Description BLOOD LEFT ARM   Final   Special Requests BOTTLES DRAWN AEROBIC AND ANAEROBIC 10 CC EACH   Final   Culture  Setup Time     Final   Value: 05/13/2013 04:23     Performed at [facility]   Culture     Final   Value:        BLOOD CULTURE RECEIVED NO GROWTH TO DATE CULTURE WILL BE HELD FOR 5 DAYS BEFORE ISSUING A FINAL NEGATIVE REPORT     Performed at [facility]   Report Status PENDING   Incomplete  CULTURE, BLOOD (ROUTINE X 2)     Status: None   Collection Time    05/13/13 12:30 AM      Result Value Ref Range Status   Specimen Description BLOOD LEFT HAND   Final   Special Requests BOTTLES DRAWN AEROBIC ONLY 10 CC  Final   Culture  Setup Time     Final   Value: 05/13/2013 08:13     [facility]   Culture     Final   Value:        BLOOD CULTURE RECEIVED NO GROWTH TO DATE CULTURE WILL BE HELD FOR 5 DAYS BEFORE ISSUING A FINAL NEGATIVE REPORT     [facility]   Report Status PENDING   Incomplete  MRSA PCR SCREENING     Status: None   Collection Time    05/13/13  2:14 AM      Result Value Ref Range Status   MRSA by PCR NEGATIVE  NEGATIVE Final   Comment:            The GeneXpert MRSA Assay (FDA     approved for NASAL specimens     only), is one component of a     comprehensive MRSA colonization     surveillance program. It is not     intended to diagnose MRSA     infection nor to guide or     monitor treatment for     MRSA infections.     Studies: No results found.  Scheduled Meds: . atorvastatin  40 mg Oral q1800  . carvedilol  25 mg Oral BID WC  . feeding supplement (GLUCERNA SHAKE)  237 mL Oral BID BM  . furosemide  60 mg Intravenous Q12H  . glipiZIDE  5 mg Oral QAC breakfast  . insulin aspart  0-15 Units Subcutaneous TID WC  . insulin aspart  0-5 Units Subcutaneous QHS  . levofloxacin  750 mg Oral Daily  . rivaroxaban  20 mg Oral QAC supper   . zolpidem  5 mg Oral QHS   Continuous Infusions:    Principal Problem:   HCAP (healthcare-associated pneumonia) Active Problems:   Chronic systolic congestive heart failure   Type 2 diabetes mellitus not at goal   Noncompliance   Atrial fibrillation with RVR   Elevated INR  acute renal failure   Time spent:   25 minutes    Hollice EspyKRISHNAN,Margi Edmundson K  [facility] Pager [phone]. If 7PM-7AM, please contact night-coverage at [email or web address], password [email or web address] 05/18/2013, 1:51 PM  LOS: 6 days

## 2013-05-19 LAB — BASIC METABOLIC PANEL
BUN: 32 mg/dL — ABNORMAL HIGH (ref 6–23)
CO2: 32 meq/L (ref 19–32)
CREATININE: 1.21 mg/dL (ref 0.50–1.35)
Calcium: 8.3 mg/dL — ABNORMAL LOW (ref 8.4–10.5)
Chloride: 95 mEq/L — ABNORMAL LOW (ref 96–112)
GFR calc Af Amer: 76 mL/min — ABNORMAL LOW (ref >90)
GFR calc non Af Amer: 65 mL/min — ABNORMAL LOW (ref >90)
GLUCOSE: 169 mg/dL — AB (ref 70–99)
Potassium: 4.2 mEq/L (ref 3.7–5.3)
Sodium: 138 mEq/L (ref 137–147)

## 2013-05-19 LAB — CULTURE, BLOOD (ROUTINE X 2)
Culture: NO GROWTH
Culture: NO GROWTH

## 2013-05-19 LAB — GLUCOSE, CAPILLARY
GLUCOSE-CAPILLARY: 200 mg/dL — AB (ref 70–99)
Glucose-Capillary: 76 mg/dL (ref 70–99)
Glucose-Capillary: 206 mg/dL — ABNORMAL HIGH (ref 70–99)

## 2013-05-19 MED ORDER — FREESTYLE SYSTEM KIT: 1.0000 | PACK | Freq: Three times a day (TID) | Status: DC

## 2013-05-19 MED ORDER — FUROSEMIDE 80 MG PO TABS: 80.0000 mg | ORAL_TABLET | Freq: Two times a day (BID) | ORAL | Status: DC

## 2013-05-19 MED ORDER — RIVAROXABAN 20 MG PO TABS: 20.0000 mg | ORAL_TABLET | Freq: Every day | ORAL | Status: DC

## 2013-05-19 MED ORDER — DILTIAZEM HCL ER 60 MG PO CP12: 60.0000 mg | ORAL_CAPSULE | Freq: Two times a day (BID) | ORAL | Status: DC

## 2013-05-19 MED ORDER — SPIRONOLACTONE 25 MG PO TABS: 25.0000 mg | ORAL_TABLET | Freq: Every day | ORAL | Status: DC

## 2013-05-19 MED ORDER — LISINOPRIL 2.5 MG PO TABS: 2.5000 mg | ORAL_TABLET | Freq: Every day | ORAL | Status: DC

## 2013-05-19 MED ORDER — INSULIN ASPART 100 UNIT/ML ~~LOC~~ SOLN: SUBCUTANEOUS | Status: DC

## 2013-05-19 MED ORDER — FUROSEMIDE 40 MG PO TABS: 40.0000 mg | ORAL_TABLET | Freq: Two times a day (BID) | ORAL | Status: DC

## 2013-05-19 MED ORDER — ATORVASTATIN CALCIUM 40 MG PO TABS: 40.0000 mg | ORAL_TABLET | Freq: Every day | ORAL | Status: DC

## 2013-05-19 MED ORDER — CARVEDILOL 25 MG PO TABS: 25.0000 mg | ORAL_TABLET | Freq: Two times a day (BID) | ORAL | Status: DC

## 2013-05-19 MED ORDER — GLIPIZIDE 5 MG PO TABS: 5.0000 mg | ORAL_TABLET | Freq: Every day | ORAL | Status: DC

## 2013-05-19 MED ORDER — CARVEDILOL 12.5 MG PO TABS: 12.5000 mg | ORAL_TABLET | Freq: Two times a day (BID) | ORAL | Status: DC

## 2013-05-19 NOTE — Progress Notes (Signed)
Pt.A/Ox4 on room air. He is ambulatory without assistance. He had no c/o pain and signs of distress.Pt.was discharged around 1740 discharge information discussed with patient and pt's social worker present. Pt.was given a cab voucher to assist him in going to the clinic after discharge today to receive his medications.

## 2013-05-19 NOTE — Discharge Summary (Addendum)
Ygnacio Fecteau, is a 58 y.o. male  DOB October 21, 1955  MRN [id number].  Admission date:  05/12/2013  Admitting Physician  Etta Quill, DO  Discharge Date:  05/19/2013   Primary MD  No PCP Per Patient  Recommendations for primary care physician for things to follow:   Monitor weight, BMP and diuretic dose. Monitor glycemic control.   Admission Diagnosis  A-fib [427.31] Type 2 diabetes mellitus not at goal [250.00] Atrial fibrillation with RVR [427.31] HCAP (healthcare-associated pneumonia) [486]   Discharge Diagnosis  A-fib [427.31] Type 2 diabetes mellitus not at goal [250.00] Atrial fibrillation with RVR [427.31] HCAP (healthcare-associated pneumonia) [486] Acute on chronic systolic heart failure. Medication noncompliance  Principal Problem:   Acute on chronic systolic heart failure Active Problems:   Type 2 diabetes mellitus not at goal   Noncompliance   Atrial fibrillation with RVR   HCAP (healthcare-associated pneumonia)   Elevated INR   Tobacco abuse   Acute renal failure      Past Medical History  Diagnosis Date  . Diabetes mellitus   . Hypertension   . Systolic heart failure     EF is 40-45% by echo, December 2013  . Noncompliance   . CAD (coronary artery disease) Sept 2013    s/p cardiac cath showing occlusion of small RCA with collaterals  . Chronic anticoagulation     on coumadin  . Atrial fibrillation     Past Surgical History  Procedure Laterality Date  . S/p cardiac cath  Sept 2013  . Coronary artery bypass graft      2 vessels per patient [name])  July 2014  . Implantable cardioverter defibrillator implant      [place] in 07/2012; [employer]     Discharge Condition: Stable   Follow UP  Follow-up Information   Follow up with Darden Amber., MD. Schedule an appointment as soon  as possible for a visit in 1 week.   Specialty:  Cardiology   Contact information:   [address] [place] [place] [address] [phone]       Follow up with [place]    . Schedule an appointment as soon as possible for a visit in 1 week.   Contact information:   [address] [place] [address] [phone]        Discharge Instructions  and  Discharge Medications         Discharge Orders   Future Orders Complete By Expires   Diet - low sodium heart healthy  As directed    Discharge instructions  As directed    Comments:     Follow with Primary MD and Dr Cathie Olden in 7 days   Get CBC, CMP, checked 7 days by Primary MD and again as instructed by your Primary MD. Get a 2 view Chest X ray done next visit.  You must get your blood work checked within a week as you her on high doses of diuretics.  Accuchecks 4  times/day, Once in AM empty stomach and then before each meal. Log in all results and show them to your Prim.MD in 3 days. If any glucose reading is under 80 or above 300 call your Prim MD immidiately. Follow Low glucose instructions for glucose under 80 as instructed.   Activity: As tolerated with Full fall precautions use walker/cane & assistance as needed   Disposition Home     Diet: Heart Healthy  - Low carb .Check your Weight same time everyday, if you gain over 2 pounds, or you develop in leg swelling, experience more shortness of breath or chest pain, call your Primary MD immediately. Follow Cardiac Low Salt Diet and 1.5 lit/day fluid restriction.   On your next visit with her primary care physician please Get Medicines reviewed and adjusted.  Please request your Prim.MD to go over all Hospital Tests and Procedure/Radiological results at the follow up, please get all Hospital records sent to your Prim MD by signing hospital release before you go home.   If you experience worsening of your admission  symptoms, develop shortness of breath, life threatening emergency, suicidal or homicidal thoughts you must seek medical attention immediately by calling 911 or calling your MD immediately  if symptoms less severe.  You Must read complete instructions/literature along with all the possible adverse reactions/side effects for all the Medicines you take and that have been prescribed to you. Take any new Medicines after you have completely understood and accpet all the possible adverse reactions/side effects.   Do not drive and provide baby sitting services if your were admitted for syncope or siezures until you have seen by Primary MD or a Neurologist and advised to do so again.  Do not drive when taking Pain medications.    Do not take more than prescribed Pain, Sleep and Anxiety Medications  Special Instructions: If you have smoked or chewed Tobacco  in the last 2 yrs please stop smoking, stop any regular Alcohol  and or any Recreational drug use.  Wear Seat belts while driving.   Please note  You were cared for by a hospitalist during your hospital stay. If you have any questions about your discharge medications or the care you received while you were in the hospital after you are discharged, you can call the unit and asked to speak with the hospitalist on call if the hospitalist that took care of you is not available. Once you are discharged, your primary care physician will handle any further medical issues. Please note that NO REFILLS for any discharge medications will be authorized once you are discharged, as it is imperative that you return to your primary care physician (or establish a relationship with a primary care physician if you do not have one) for your aftercare needs so that they can reassess your need for medications and monitor your lab values.   Increase activity slowly  As directed        Medication List    STOP taking these medications       levofloxacin 750 MG tablet    Commonly known as:  LEVAQUIN      TAKE these medications       atorvastatin 40 MG tablet  Commonly known as:  LIPITOR  Take 1 tablet (40 mg total) by mouth daily at 6 PM.     carvedilol 12.5 MG tablet  Commonly known as:  COREG  Take 1 tablet (12.5 mg total) by mouth 2 (two) times daily with a meal.  diltiazem 60 MG 12 hr capsule  Commonly known as:  CARDIZEM SR  Take 1 capsule (60 mg total) by mouth every 12 (twelve) hours.     furosemide 40 MG tablet  Commonly known as:  LASIX  Take 1 tablet (40 mg total) by mouth 2 (two) times daily.     glipiZIDE 5 MG tablet  Commonly known as:  GLUCOTROL  Take 1 tablet (5 mg total) by mouth daily before breakfast.     glucose monitoring kit monitoring kit  1 each by Does not apply route 4 (four) times daily - after meals and at bedtime. 1 month Diabetic Testing Supplies for QAC-QHS accuchecks.Any brand OK     insulin aspart 100 UNIT/ML injection  Commonly known as:  NOVOLOG  - Before each meal 3 times a day, 140-199 - 2 units, 200-250 - 4 units, 251-299 - 6 units,  300-349 - 8 units,  350 or above 10 units.  - Dispense syringes and needles as needed, Ok to switch to PEN if approved. Can switch to any approved short-acting insulin.     lisinopril 2.5 MG tablet  Commonly known as:  ZESTRIL  Take 1 tablet (2.5 mg total) by mouth daily.     Rivaroxaban 20 MG Tabs tablet  Commonly known as:  XARELTO  Take 1 tablet (20 mg total) by mouth daily before supper.     spironolactone 25 MG tablet  Commonly known as:  ALDACTONE  Take 1 tablet (25 mg total) by mouth daily.          Diet and Activity recommendation: See Discharge Instructions above   Consults obtained - none   Major procedures and Radiology Reports - PLEASE review detailed and final reports for all details, in brief -      Dg Chest 2 View  05/12/2013   CLINICAL DATA:  Chest pain  EXAM: CHEST  2 VIEW  COMPARISON:  04/04/2013  FINDINGS: Consolidation noted in  the right middle lobe concerning for pneumonia. This has worsened since prior study. Small right pleural effusion. Left lung is clear. Her left pacer is in place, unchanged. Stable cardiomegaly.  IMPRESSION: Worsening right middle lobe airspace opacity concerning for worsening pneumonia.  Small right pleural effusion.   Electronically Signed   By: Rolm Baptise M.D.   On: 05/12/2013 23:44   US Abdomen Complete  05/14/2013   CLINICAL DATA:  Elevated INR, elevated LFTs  EXAM: ULTRASOUND ABDOMEN COMPLETE  COMPARISON:  05/17/2012  FINDINGS: Gallbladder:  Well distended without evidence of cholelithiasis. Gallbladder wall thickening to 7 mm is noted. A negative sonographic Percell Miller sign is seen. The wall thickening may be related to the mild ascites.  Common bile duct:  Diameter: 3.8 mm.  Liver:  Increase in echogenicity consistent with fatty infiltration.  IVC:  No abnormality visualized.  Pancreas:  Not well visualized  Spleen:  Size and appearance within normal limits.  Right Kidney:  Length: 12.2 cm.  A 3.3 cm cyst is noted in the upper pole.  Left Kidney:  Length: 12.3 cm. Echogenicity within normal limits. No mass or hydronephrosis visualized.  Abdominal aorta:  No aneurysm visualized.  Other findings:  Bilateral pleural effusions are identified. Right greater than left. Mild ascites is noted. No sizable pocket to allow for safe paracentesis is noted.  IMPRESSION: Gallbladder wall thickening. This may be related to the minimal ascites. No gallstones are seen.  Bilateral pleural effusions right greater than left.  Minimal ascites.   Electronically Signed  By: Alcide Clever M.D.   On: 05/14/2013 09:20    Micro Results     Recent Results (from the past 240 hour(s))  CULTURE, BLOOD (ROUTINE X 2)     Status: None   Collection Time    05/13/13 12:20 AM      Result Value Ref Range Status   Specimen Description BLOOD LEFT ARM   Final   Special Requests BOTTLES DRAWN AEROBIC AND ANAEROBIC 10 CC EACH   Final    Culture  Setup Time     Final   Value: 05/13/2013 04:23     Performed at [facility]   Culture     Final   Value: NO GROWTH 5 DAYS     Performed at [facility]   Report Status 05/19/2013 FINAL   Final  CULTURE, BLOOD (ROUTINE X 2)     Status: None   Collection Time    05/13/13 12:30 AM      Result Value Ref Range Status   Specimen Description BLOOD LEFT HAND   Final   Special Requests BOTTLES DRAWN AEROBIC ONLY 10 CC   Final   Culture  Setup Time     Final   Value: 05/13/2013 08:13     Performed at [facility]   Culture     Final   Value: NO GROWTH 5 DAYS     Performed at [facility]   Report Status 05/19/2013 FINAL   Final  MRSA PCR SCREENING     Status: None   Collection Time    05/13/13  2:14 AM      Result Value Ref Range Status   MRSA by PCR NEGATIVE  NEGATIVE Final   Comment:            The GeneXpert MRSA Assay (FDA     approved for NASAL specimens     only), is one component of a     comprehensive MRSA colonization     surveillance program. It is not     intended to diagnose MRSA     infection nor to guide or     monitor treatment for     MRSA infections.     History of present illness and  Hospital Course:     Kindly see H&P for history of present illness and admission details, please review complete Labs, Consult reports and Test reports for all details in brief Sandy Blouch, is a 58 y.o. male, patient with history of  chronic systolic heart failure with EF 20%, noncompliance with medications and followups, ongoing smoking counseled to quit smoking, diabetes mellitus type 2, CAD, A. fib, who was admitted in the hospital with chest pain and shortness of breath, workup was consistent with HCAP along with acute on chronic systolic heart failure and atrial fibrillation with rapid ventricular rate.   HCAP was treated in the hospital with antibiotics for pneumonia, he's finished his Levaquin treatment today, afebrile without  leukocytosis, currently no shortness of breath chest pain cough.     Acute on chronic systolic heart failure with EF 20%. Patient is noncompliant with his medications and diet, he failed to show up at his PCPs office or to refill his medications from last hospitalization, he was treated with IV Lasix here, he still is fluid overload but refuses to stay anymore due to domestic issues and family constraints, placed him on appropriate medications which include Coreg, ACE inhibitor, Lasix and Aldactone, have requested him to follow strict  dietary restrictions which have been provided in writing. He's been counseled to follow with his PCP and primary cardiologist within a week to get his BMP weight and diuretic dose monitored and adjusted.    Atrial fibrillation with RVR was initially treated with IV Cardizem, he is now on a stable dose of Coreg and Cardizem, he's tolerating it well, he has been placed on xaralto.   Allergies mellitus type II A1c was 7.7, patient never took his prescribed metformin, he is being placed her on glipizide along with sliding scale insulin with stable glucose, she will get a glucometer, prescriptions for short-acting insulin along with glipizide. He will follow with his PCP with his Accu-Chek readings next week.    Upon admission patient had acute renal failure from hyperperfusion from decompensated heart failure which is improved with diuresis. Creatinine is now stable and within normal limits.    Tobacco abuse. Counseled to quit smoking.    Case management assisting the patient in getting home medications.     Today   Subjective:   Juliane Lack today has no headache,no chest abdominal pain,no new weakness tingling or numbness, feels much better wants to go home today at any rate, he says he cannot stay here anymore. He has some domestic issues that he has to tend to.   Objective:   Blood pressure 96/62, pulse 96, temperature 98 F (36.7 C), temperature  source Oral, resp. rate 18, height $RemoveBe'5\' 7"'GIorfsPbf$  (1.702 m), weight 93.35 kg (205 lb 12.8 oz), SpO2 97.00%.   Intake/Output Summary (Last 24 hours) at 05/19/13 1129 Last data filed at 05/19/13 1101  Gross per 24 hour  Intake   1380 ml  Output   2227 ml  Net   -847 ml    Exam Awake Alert, Oriented *3, No new F.N deficits, Normal affect [place].AT,PERRAL Supple Neck,No JVD, No cervical lymphadenopathy appriciated.  Symmetrical Chest wall movement, Good air movement bilaterally, few rales RRR,No Gallops,Rubs or new Murmurs, No Parasternal Heave +ve B.Sounds, Abd Soft, Non tender, No organomegaly appriciated, No rebound -guarding or rigidity. No Cyanosis, Clubbing , 1+ edema, No new Rash or bruise  Data Review   CBC w Diff: Lab Results  Component Value Date   WBC 8.2 05/15/2013   HGB 11.3* 05/15/2013   HCT 39.0 05/15/2013   PLT 282 05/15/2013   LYMPHOPCT 20 05/12/2013   MONOPCT 13* 05/12/2013   EOSPCT 0 05/12/2013   BASOPCT 0 05/12/2013    CMP: Lab Results  Component Value Date   NA 138 05/19/2013   K 4.2 05/19/2013   CL 95* 05/19/2013   CO2 32 05/19/2013   BUN 32* 05/19/2013   CREATININE 1.21 05/19/2013   PROT 7.2 05/14/2013   ALBUMIN 3.1* 05/14/2013   BILITOT 1.7* 05/14/2013   ALKPHOS 174* 05/14/2013   AST 110* 05/14/2013   ALT 105* 05/14/2013  .   Total Time in preparing paper work, data evaluation and todays exam - 35 minutes  Thurnell Lose M.D on 05/19/2013 at 11:29 AM  [facility] Office  [phone]

## 2013-05-19 NOTE — Progress Notes (Signed)
UR completed Clotine Heiner K. Hubbert Landrigan, RN, BSN, MSHL, CCM  05/19/2013 3:28 PM

## 2013-05-25 ENCOUNTER — Encounter: Payer: Self-pay | Admitting: Internal Medicine

## 2013-05-25 ENCOUNTER — Ambulatory Visit: Payer: Medicaid Other | Attending: Internal Medicine | Admitting: Internal Medicine

## 2013-05-25 VITALS — BP 130/77 | HR 160 | Temp 98.9°F | Resp 14 | Ht 67.0 in | Wt 220.0 lb

## 2013-05-25 DIAGNOSIS — I4891 Unspecified atrial fibrillation: Secondary | ICD-10-CM | POA: Diagnosis not present

## 2013-05-25 DIAGNOSIS — Z951 Presence of aortocoronary bypass graft: Secondary | ICD-10-CM | POA: Diagnosis not present

## 2013-05-25 DIAGNOSIS — I10 Essential (primary) hypertension: Secondary | ICD-10-CM | POA: Insufficient documentation

## 2013-05-25 DIAGNOSIS — F17200 Nicotine dependence, unspecified, uncomplicated: Secondary | ICD-10-CM | POA: Diagnosis not present

## 2013-05-25 DIAGNOSIS — I2510 Atherosclerotic heart disease of native coronary artery without angina pectoris: Secondary | ICD-10-CM | POA: Insufficient documentation

## 2013-05-25 DIAGNOSIS — Z91199 Patient's noncompliance with other medical treatment and regimen due to unspecified reason: Secondary | ICD-10-CM | POA: Diagnosis not present

## 2013-05-25 DIAGNOSIS — I509 Heart failure, unspecified: Secondary | ICD-10-CM | POA: Diagnosis not present

## 2013-05-25 DIAGNOSIS — Z95810 Presence of automatic (implantable) cardiac defibrillator: Secondary | ICD-10-CM | POA: Insufficient documentation

## 2013-05-25 DIAGNOSIS — I5023 Acute on chronic systolic (congestive) heart failure: Secondary | ICD-10-CM | POA: Diagnosis not present

## 2013-05-25 DIAGNOSIS — Z9119 Patient's noncompliance with other medical treatment and regimen: Secondary | ICD-10-CM | POA: Diagnosis not present

## 2013-05-25 DIAGNOSIS — E119 Type 2 diabetes mellitus without complications: Secondary | ICD-10-CM

## 2013-05-25 DIAGNOSIS — R000 Tachycardia, unspecified: Secondary | ICD-10-CM | POA: Diagnosis present

## 2013-05-25 LAB — POCT GLYCOSYLATED HEMOGLOBIN (HGB A1C): HEMOGLOBIN A1C: 7

## 2013-05-25 LAB — GLUCOSE, POCT (MANUAL RESULT ENTRY): POC Glucose: 269 mg/dl — AB (ref 70–99)

## 2013-05-25 MED ORDER — GLUCOSE BLOOD VI STRP: ORAL_STRIP | Status: DC

## 2013-05-25 NOTE — Progress Notes (Signed)
Patient ID: Jacob [name], male   DOB: 28-May-1955, 58 y.o.   MRN: [id number]   CC:  HPI: 58 y.o. male, patient with history of chronic systolic heart failure with EF 20%, noncompliance with medications and followups, ongoing smoking counseled to quit smoking, diabetes mellitus type 2, CAD, A. fib, who was admitted in the hospital with chest pain and shortness of breath, workup was consistent with HCAP along with acute on chronic systolic heart failure and atrial fibrillation with rapid ventricular rate.HCAP was treated in the hospital with antibiotics for pneumonia, he's finished his Levaquin 2/25. Patient presented to my office for a followup and was found to have a heart rate of 160 and a pulse ox of 65%. After placing him on a nonrebreather oxygen saturation came up to 100%. He demonstrated minimal symptoms in terms of shortness of breath however the patient did complain of significant orthopnea, cough, continues to smoke. He is on full dose anticoagulation for atrial fibrillation.     No Known Allergies Past Medical History  Diagnosis Date  . Diabetes mellitus   . Hypertension   . Systolic heart failure     EF is 40-45% by echo, December 2013  . Noncompliance   . CAD (coronary artery disease) Sept 2013    s/p cardiac cath showing occlusion of small RCA with collaterals  . Chronic anticoagulation     on coumadin  . Atrial fibrillation    Current Outpatient Prescriptions on File Prior to Visit  Medication Sig Dispense Refill  . atorvastatin (LIPITOR) 40 MG tablet Take 1 tablet (40 mg total) by mouth daily at 6 PM.  30 tablet  0  . carvedilol (COREG) 12.5 MG tablet Take 1 tablet (12.5 mg total) by mouth 2 (two) times daily with a meal.  60 tablet  2  . diltiazem (CARDIZEM SR) 60 MG 12 hr capsule Take 1 capsule (60 mg total) by mouth every 12 (twelve) hours.  60 capsule  1  . furosemide (LASIX) 40 MG tablet Take 1 tablet (40 mg total) by mouth 2 (two) times daily.  30 tablet  0  .  glipiZIDE (GLUCOTROL) 5 MG tablet Take 1 tablet (5 mg total) by mouth daily before breakfast.  30 tablet  0  . glucose monitoring kit (FREESTYLE) monitoring kit 1 each by Does not apply route 4 (four) times daily - after meals and at bedtime. 1 month Diabetic Testing Supplies for QAC-QHS accuchecks.Any brand OK  1 each  1  . lisinopril (ZESTRIL) 2.5 MG tablet Take 1 tablet (2.5 mg total) by mouth daily.  30 tablet  0  . Rivaroxaban (XARELTO) 20 MG TABS tablet Take 1 tablet (20 mg total) by mouth daily before supper.  30 tablet  0  . spironolactone (ALDACTONE) 25 MG tablet Take 1 tablet (25 mg total) by mouth daily.  20 tablet  0  . insulin aspart (NOVOLOG) 100 UNIT/ML injection Before each meal 3 times a day, 140-199 - 2 units, 200-250 - 4 units, 251-299 - 6 units,  300-349 - 8 units,  350 or above 10 units. Dispense syringes and needles as needed, Ok to switch to PEN if approved. Can switch to any approved short-acting insulin.  1 vial  12   No current facility-administered medications on file prior to visit.   Family History  Problem Relation Age of Onset  . Diabetes Mother    History   Social History  . Marital Status: Divorced    Spouse Name: N/A  Number of Children: 3  . Years of Education: N/A   Occupational History  . Unemployed    Social History Main Topics  . Smoking status: Current Every Day Smoker -- 0.50 packs/day for 20 years    Types: Cigarettes  . Smokeless tobacco: Never Used     Comment: Still with 3 - 4 per day.   . Alcohol Use: No  . Drug Use: No  . Sexual Activity: No   Other Topics Concern  . Not on file   Social History Narrative   Has an apartment with a roommate. He was living on the streets in 01-29-2013.  He reports that his father died in [place] in 01/29/13.  He is divorced.  He is no longer estranged from his son, but still from his daughter who lives locally.  Neither of his parents, nor any siblings have any history of CAD.    Review of  Systems  Constitutional: Negative for fever, chills, diaphoresis, activity change, appetite change and fatigue.  HENT: Negative for ear pain, nosebleeds, congestion, facial swelling, rhinorrhea, neck pain, neck stiffness and ear discharge.   Eyes: Negative for pain, discharge, redness, itching and visual disturbance.  Respiratory: Negative for cough, choking, chest tightness, shortness of breath, wheezing and stridor.   Cardiovascular: Negative for chest pain, palpitations and leg swelling.  Gastrointestinal: Negative for abdominal distention.  Genitourinary: Negative for dysuria, urgency, frequency, hematuria, flank pain, decreased urine volume, difficulty urinating and dyspareunia.  Musculoskeletal: Negative for back pain, joint swelling, arthralgias and gait problem.  Neurological: Negative for dizziness, tremors, seizures, syncope, facial asymmetry, speech difficulty, weakness, light-headedness, numbness and headaches.  Hematological: Negative for adenopathy. Does not bruise/bleed easily.  Psychiatric/Behavioral: Negative for hallucinations, behavioral problems, confusion, dysphoric mood, decreased concentration and agitation.    Objective:   Filed Vitals:   05/25/13 1718  BP: 130/77  Pulse: 160  Temp: 98.9 F (37.2 C)  Resp: 14    Physical Exam  Constitutional: Appears well-developed and well-nourished. No distress.  HENT: Normocephalic. External right and left ear normal. Oropharynx is clear and moist.  Eyes: Conjunctivae and EOM are normal. PERRLA, no scleral icterus.  Neck: Normal ROM. Neck supple. No JVD. No tracheal deviation. No thyromegaly.  CVS: RRR, S1/S2 +, no murmurs, no gallops, no carotid bruit.  Pulmonary: Effort and breath sounds normal, no stridor, rhonchi, wheezes, rales.  Abdominal: Soft. BS +,  no distension, tenderness, rebound or guarding.  Musculoskeletal: Normal range of motion. No edema and no tenderness.  Lymphadenopathy: No lymphadenopathy noted,  cervical, inguinal. Neuro: Alert. Normal reflexes, muscle tone coordination. No cranial nerve deficit. Skin: Skin is warm and dry. No rash noted. Not diaphoretic. No erythema. No pallor.  Psychiatric: Normal mood and affect. Behavior, judgment, thought content normal.   Lab Results  Component Value Date   WBC 8.2 05/15/2013   HGB 11.3* 05/15/2013   HCT 39.0 05/15/2013   MCV 69.5* 05/15/2013   PLT 282 05/15/2013   Lab Results  Component Value Date   CREATININE 1.21 05/19/2013   BUN 32* 05/19/2013   NA 138 05/19/2013   K 4.2 05/19/2013   CL 95* 05/19/2013   CO2 32 05/19/2013    Lab Results  Component Value Date   HGBA1C 7.0 05/25/2013   Lipid Panel     Component Value Date/Time   CHOL 62 04/07/2013 0420   TRIG 56 04/07/2013 0420   HDL 15* 04/07/2013 0420   CHOLHDL 4.1 04/07/2013 0420   VLDL 11 04/07/2013  [place] 36 04/07/2013 0420       Assessment and plan:   Patient Active Problem List   Diagnosis Date Noted  . Acute on chronic systolic heart failure 09/90/6893  . Acute renal failure 05/14/2013  . Atrial fibrillation with RVR 05/13/2013  . HCAP (healthcare-associated pneumonia) 05/13/2013  . Elevated INR 05/13/2013  . Tobacco abuse 05/13/2013  . Atrial fibrillation with rapid ventricular response 02/11/2013  . ICD - in place- BS May 2014 [facility] 02/11/2013  . Homelessness 01/11/2013  . Microcytic anemia 01/11/2013  . Musculoskeletal chest pain 01/08/2013  . 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 Acute on chronic combined systolic and diastolic heart failure 40/68/4033  . Type 2 diabetes mellitus not at goal 12/01/2011  . Noncompliance 12/01/2011  . Chronic systolic congestive heart failure 11/08/2011  . CAF-  not previously a Coumadin candidate secondary to complaince 10/01/2011  . CAP-?- (community acquired pneumonia)- on Lebaquin 10/01/2011  . HYPERTENSION 10/30/2006      Patient here to establish care but because of his significant tachycardia as  well as significant hypoxia, patient was immediately placed on a nonrebreather, now saturating at 100%. In response to the oxygen the patient's pulse has come down. The patient is being referred to the ER for emergent evaluation, Given his EF of 20% the patient will need a stat chest x-ray, IV Lasix, IV steroids to maximize therapy for congestive heart failure and COPD exacerbation He may need BiPAP support after ABGs and may need step down or ICU care     The patient was given clear instructions to go to ER or return to medical center if symptoms don't improve, worsen or new problems develop. The patient verbalized understanding. The patient was told to call to get any lab results if not heard anything in the next week.

## 2013-05-25 NOTE — Progress Notes (Signed)
Patient is here to establish care from a hospital follow up. Patient is a diabetic. Patient shows signs of a racing pulse and ox level is significantly low today. Patient is not taking insulin, but still has a controlled glucose. Patient has no other symptoms today. Patient has an interpreter.

## 2013-05-25 NOTE — Progress Notes (Signed)
Pt is non rebreather mask @100 %

## 2013-05-26 ENCOUNTER — Ambulatory Visit: Payer: Medicaid - Out of State | Attending: Internal Medicine | Admitting: Internal Medicine

## 2013-05-26 VITALS — BP 103/69 | HR 84 | Temp 97.7°F | Resp 16

## 2013-05-26 DIAGNOSIS — E119 Type 2 diabetes mellitus without complications: Secondary | ICD-10-CM

## 2013-05-26 DIAGNOSIS — I255 Ischemic cardiomyopathy: Secondary | ICD-10-CM

## 2013-05-26 DIAGNOSIS — I2589 Other forms of chronic ischemic heart disease: Secondary | ICD-10-CM

## 2013-05-26 LAB — GLUCOSE, POCT (MANUAL RESULT ENTRY): POC Glucose: 122 mg/dl — AB (ref 70–99)

## 2013-05-26 MED ORDER — FUROSEMIDE 40 MG PO TABS: 40.0000 mg | ORAL_TABLET | Freq: Two times a day (BID) | ORAL | Status: DC

## 2013-05-26 MED ORDER — ATORVASTATIN CALCIUM 40 MG PO TABS: 40.0000 mg | ORAL_TABLET | Freq: Every day | ORAL | Status: DC

## 2013-05-26 MED ORDER — LISINOPRIL 2.5 MG PO TABS: 2.5000 mg | ORAL_TABLET | Freq: Every day | ORAL | Status: DC

## 2013-05-26 MED ORDER — CARVEDILOL 12.5 MG PO TABS: 12.5000 mg | ORAL_TABLET | Freq: Two times a day (BID) | ORAL | Status: DC

## 2013-05-26 MED ORDER — SPIRONOLACTONE 25 MG PO TABS: 25.0000 mg | ORAL_TABLET | Freq: Every day | ORAL | Status: DC

## 2013-05-26 MED ORDER — GLIPIZIDE 5 MG PO TABS: 5.0000 mg | ORAL_TABLET | Freq: Two times a day (BID) | ORAL | Status: DC

## 2013-05-26 MED ORDER — RIVAROXABAN 20 MG PO TABS: 20.0000 mg | ORAL_TABLET | Freq: Every day | ORAL | Status: DC

## 2013-05-26 MED ORDER — DILTIAZEM HCL ER 60 MG PO CP12: 60.0000 mg | ORAL_CAPSULE | Freq: Two times a day (BID) | ORAL | Status: DC

## 2013-05-26 NOTE — Progress Notes (Signed)
Patient here for follow up from yesterday visit Has history of atrial fib

## 2013-05-26 NOTE — Progress Notes (Signed)
Patient ID: Jacob [name], male   DOB: February 12, 1956, 58 y.o.   MRN: [id number]   CC:  HPI:  Patient ID: Jacob [name], male DOB: [birth year], 57 y.o. MRN: [id number]  CC:  HPI:  58 y.o. male, patient with history of chronic systolic heart failure with EF 20%, noncompliance with medications and followups, ongoing smoking counseled to quit smoking, diabetes mellitus type 2, CAD, A. fib, who was admitted in the hospital with chest pain and shortness of breath, workup was consistent with HCAP along with acute on chronic systolic heart failure and atrial fibrillation with rapid ventricular rate.HCAP was treated in the hospital with antibiotics for pneumonia, he's finished his Levaquin 2/25  He was seen in clinic yesterday to as a followup. Patient was found to transient hypoxia with a pulse ox of 65%, heart rate of 160 however associated with minimal symptoms, vital signs were rechecked and revised, given that repeat vital signs quickly showed that his heart rate did come down to 88, and pulse ox had improved to 100% on a nonrebreather, not sure if his first readings were  accurate.  Regardless the patient was requested to go to the ER, 20 chest x-ray, d-dimer, troponin. EKG yesterday showed a heart rate of 102, atrial fibrillation, pvc.  Today the patient has no cardiopulmonary complaints, he does have dependent edema in his legs. He tells me that he has an appointment with [facility]  cardiology on 3/27. Patient continues to smoke half a pack a day, has cut back down from 3 packs a day. Currently living in a motel.  Also has questions about his diabetes medications. The patient does not want to take insulin and wants to know if he can just be on oral hypoglycemics. CBG today was 122. Results requesting an ophthalmology exam.            No Known Allergies Past Medical History  Diagnosis Date  . Diabetes mellitus   . Hypertension   . Systolic heart failure     EF is 40-45% by echo, December 2013   . Noncompliance   . CAD (coronary artery disease) Sept 2013    s/p cardiac cath showing occlusion of small RCA with collaterals  . Chronic anticoagulation     on coumadin  . Atrial fibrillation    Current Outpatient Prescriptions on File Prior to Visit  Medication Sig Dispense Refill  . glucose blood test strip Use as instructed  100 each  12  . glucose monitoring kit (FREESTYLE) monitoring kit 1 each by Does not apply route 4 (four) times daily - after meals and at bedtime. 1 month Diabetic Testing Supplies for QAC-QHS accuchecks.Any brand OK  1 each  1   No current facility-administered medications on file prior to visit.   Family History  Problem Relation Age of Onset  . Diabetes Mother    History   Social History  . Marital Status: Divorced    Spouse Name: N/A    Number of Children: 3  . Years of Education: N/A   Occupational History  . Unemployed    Social History Main Topics  . Smoking status: Current Every Day Smoker -- 0.50 packs/day for 20 years    Types: Cigarettes  . Smokeless tobacco: Never Used     Comment: Still with 3 - 4 per day.   . Alcohol Use: No  . Drug Use: No  . Sexual Activity: No   Other Topics Concern  . Not on file   Social History Narrative  Has an apartment with a roommate. He was living on the streets in 2013-01-25.  He reports that his father died in [place] in 2013-01-25.  He is divorced.  He is no longer estranged from his son, but still from his daughter who lives locally.  Neither of his parents, nor any siblings have any history of CAD.    Review of Systems  Constitutional: Negative for fever, chills, diaphoresis, activity change, appetite change and fatigue.  HENT: Negative for ear pain, nosebleeds, congestion, facial swelling, rhinorrhea, neck pain, neck stiffness and ear discharge.   Eyes: Negative for pain, discharge, redness, itching and visual disturbance.  Respiratory: Negative for cough, choking, chest tightness,  shortness of breath, wheezing and stridor.   Cardiovascular: Negative for chest pain, palpitations and leg swelling.  Gastrointestinal: Negative for abdominal distention.  Genitourinary: Negative for dysuria, urgency, frequency, hematuria, flank pain, decreased urine volume, difficulty urinating and dyspareunia.  Musculoskeletal: Negative for back pain, 2+ leg swelling, arthralgias and gait problem.  Neurological: Negative for dizziness, tremors, seizures, syncope, facial asymmetry, speech difficulty, weakness, light-headedness, numbness and headaches.  Hematological: Negative for adenopathy. Does not bruise/bleed easily.  Psychiatric/Behavioral: Negative for hallucinations, behavioral problems, confusion, dysphoric mood, decreased concentration and agitation.    Objective:   Filed Vitals:   05/26/13 1345  BP: 103/69  Pulse: 84  Temp: 97.7 F (36.5 C)  Resp: 16    Physical Exam  Constitutional: Appears well-developed and well-nourished. No distress.  HENT: Normocephalic. External right and left ear normal. Oropharynx is clear and moist.  Eyes: Conjunctivae and EOM are normal. PERRLA, no scleral icterus.  Neck: Normal ROM. Neck supple. No JVD. No tracheal deviation. No thyromegaly.  CVS: RRR, S1/S2 +, no murmurs, no gallops, no carotid bruit.  Pulmonary: Effort and breath sounds normal, no stridor, rhonchi, wheezes, rales.  Abdominal: Soft. BS +,  no distension, tenderness, rebound or guarding.  Musculoskeletal: Normal range of motion. 2+ pitting edema and no tenderness.  Lymphadenopathy: No lymphadenopathy noted, cervical, inguinal. Neuro: Alert. Normal reflexes, muscle tone coordination. No cranial nerve deficit. Skin: Skin is warm and dry. No rash noted. Not diaphoretic. No erythema. No pallor.  Psychiatric: Normal mood and affect. Behavior, judgment, thought content normal.   Lab Results  Component Value Date   WBC 8.2 05/15/2013   HGB 11.3* 05/15/2013   HCT 39.0 05/15/2013    MCV 69.5* 05/15/2013   PLT 282 05/15/2013   Lab Results  Component Value Date   CREATININE 1.21 05/19/2013   BUN 32* 05/19/2013   NA 138 05/19/2013   K 4.2 05/19/2013   CL 95* 05/19/2013   CO2 32 05/19/2013    Lab Results  Component Value Date   HGBA1C 7.0 05/25/2013   Lipid Panel     Component Value Date/Time   CHOL 62 04/07/2013 0420   TRIG 56 04/07/2013 0420   HDL 15* 04/07/2013 0420   CHOLHDL 4.1 04/07/2013 0420   VLDL 11 04/07/2013 0420   LDLCALC 36 04/07/2013 0420       Assessment and plan:   Patient Active Problem List   Diagnosis Date Noted  . Acute on chronic systolic heart failure 41/28/7867  . Acute renal failure 05/14/2013  . Atrial fibrillation with RVR 05/13/2013  . HCAP (healthcare-associated pneumonia) 05/13/2013  . Elevated INR 05/13/2013  . Tobacco abuse 05/13/2013  . Atrial fibrillation with rapid ventricular response 02/11/2013  . ICD - in place- BS May 2014 [facility] 02/11/2013  . Homelessness 01/11/2013  .  Microcytic anemia 01/11/2013  . Musculoskeletal chest pain 01/08/2013  . 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 Acute on chronic combined systolic and diastolic heart failure 68/02/7516  . Type 2 diabetes mellitus not at goal 12/01/2011  . Noncompliance 12/01/2011  . Chronic systolic congestive heart failure 11/08/2011  . CAF-  not previously a Coumadin candidate secondary to complaince 10/01/2011  . CAP-?- (community acquired pneumonia)- on Lebaquin 10/01/2011  . HYPERTENSION 10/30/2006       Hospital-acquired pneumonia Completed his antibiotic regimen Afebrile today but no pulmonary symptoms   Diabetes Last A1c was 7.0 discontinue insulin sliding scale Increase glipizide to 5 mg twice a day   Atrial fibrillation with ischemic cardiomyopathy Continue Coreg, Cardizem, anticoagulation Follow up with [facility]  cardiology  Future labs ordered for 3/25     The patient was given clear instructions to go to ER or return to  medical center if symptoms don't improve, worsen or new problems develop. The patient verbalized understanding. The patient was told to call to get any lab results if not heard anything in the next week.

## 2013-05-27 ENCOUNTER — Telehealth: Payer: Self-pay | Admitting: *Deleted

## 2013-05-27 NOTE — Telephone Encounter (Signed)
Message copied by Jami Bogdanski, [place] R on Thu May 27, 2013  3:04 PM ------      Message from: Susie Cassette MD, Germain Osgood      Created: Wed May 26, 2013 11:14 AM       Notify patient if A1c is 7.0, this is better than 7.7 the last time. Also notify patient that if he is having any cardiopulmonary symptoms, then he needs to come back and see [place]. ------

## 2013-05-27 NOTE — Telephone Encounter (Signed)
Contacted patient with an interpreter to notify him of his lab results. Telephone number is not available. Unable to reach patient. Could not leave patient a voicemail message.

## 2013-06-01 ENCOUNTER — Ambulatory Visit (INDEPENDENT_AMBULATORY_CARE_PROVIDER_SITE_OTHER): Payer: Medicaid - Out of State | Admitting: Home Health Services

## 2013-06-01 DIAGNOSIS — E119 Type 2 diabetes mellitus without complications: Secondary | ICD-10-CM

## 2013-06-01 NOTE — Progress Notes (Signed)
DIABETES Pt came in to have a retinal scan per diabetic care.   Image was taken and submitted to [facility]. Garg for reading.    Results will be available in 1-2 weeks.  Results will be given to PCP for review and to contact patient.  Jacob [name]  

## 2013-06-18 ENCOUNTER — Encounter: Payer: Self-pay | Admitting: Cardiovascular Disease

## 2013-06-18 ENCOUNTER — Ambulatory Visit (INDEPENDENT_AMBULATORY_CARE_PROVIDER_SITE_OTHER): Payer: Medicaid - Out of State | Admitting: Cardiovascular Disease

## 2013-06-18 ENCOUNTER — Ambulatory Visit (INDEPENDENT_AMBULATORY_CARE_PROVIDER_SITE_OTHER): Payer: Medicaid - Out of State | Admitting: *Deleted

## 2013-06-18 VITALS — BP 120/90 | HR 82 | Ht 67.0 in | Wt 217.1 lb

## 2013-06-18 DIAGNOSIS — I5043 Acute on chronic combined systolic (congestive) and diastolic (congestive) heart failure: Secondary | ICD-10-CM

## 2013-06-18 DIAGNOSIS — I428 Other cardiomyopathies: Secondary | ICD-10-CM

## 2013-06-18 DIAGNOSIS — I4891 Unspecified atrial fibrillation: Secondary | ICD-10-CM

## 2013-06-18 DIAGNOSIS — I5022 Chronic systolic (congestive) heart failure: Secondary | ICD-10-CM

## 2013-06-18 DIAGNOSIS — Z95810 Presence of automatic (implantable) cardiac defibrillator: Secondary | ICD-10-CM

## 2013-06-18 DIAGNOSIS — I509 Heart failure, unspecified: Secondary | ICD-10-CM

## 2013-06-18 DIAGNOSIS — I10 Essential (primary) hypertension: Secondary | ICD-10-CM

## 2013-06-18 DIAGNOSIS — I420 Dilated cardiomyopathy: Secondary | ICD-10-CM

## 2013-06-18 LAB — BASIC METABOLIC PANEL
BUN: 20 mg/dL (ref 6–23)
CO2: 26 mEq/L (ref 19–32)
CREATININE: 0.9 mg/dL (ref 0.4–1.5)
Calcium: 8.6 mg/dL (ref 8.4–10.5)
Chloride: 102 mEq/L (ref 96–112)
GFR: 93.86 mL/min (ref >60.00)
Glucose, Bld: 121 mg/dL — ABNORMAL HIGH (ref 70–99)
Potassium: 4.5 mEq/L (ref 3.5–5.1)
Sodium: 133 mEq/L — ABNORMAL LOW (ref 135–145)

## 2013-06-18 MED ORDER — CARVEDILOL 25 MG PO TABS: 25.0000 mg | ORAL_TABLET | Freq: Two times a day (BID) | ORAL | Status: DC

## 2013-06-18 NOTE — Progress Notes (Addendum)
Jacob [name] Date of Birth  [birth year]       [facility] Office [address], [place], [place]  [address]   [place], [place]  [address] [phone]     [phone]   Fax  [phone]    Fax [phone]  Problem List: 1. Chronic systolic congestive heart failure-EF of 25-30% by echo 2. Hypertension 3. Diabetes mellitus 4. Atrial Fibrillation   History of Present Illness:  The patient was recently hospitalized for CHF.  He left AGAINST MEDICAL ADVICE before completing the workup.  In looking through the chart it appears that his had some problems with noncompliance.  He has a history of hypertension.  Marland Kitchen  He eats a lot of fast food. He typically would eat  2 cheeseburgers at [facility] for dinner.  He does not cook at home.  He used to go to [employer] but now does not have a primary medical doctor.  He stopped smoking 2 months ago.  He complains that his medications are no longer working for him. 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  June 18, 2013:  Pt has had CABG since his last visit ( 2014, [place])     He also has had a defibrillator placed up in [place].   Patient was admitted to the hospital with [facility] and rapid AF.    Echo Jan. 2015:  Left ventricle: The cavity size was mildly dilated. Wall thickness was increased in a pattern of mild LVH. Systolic function was severely reduced. The estimated ejection fraction was in the range of 20% to 25%. Diffuse hypokinesis. - Aortic valve: Trivial regurgitation. - Left atrium: The atrium was moderately dilated. - Right ventricle: Systolic function was moderately reduced. - Right atrium: The atrium was mildly dilated. - Pericardium, extracardiac: A trivial pericardial effusion was identified.   Current Outpatient Prescriptions on File Prior to Visit  Medication Sig Dispense Refill  . atorvastatin (LIPITOR) 40 MG tablet Take 1 tablet (40 mg total) by  mouth daily at 6 PM.  30 tablet  5  . carvedilol (COREG) 12.5 MG tablet Take 1 tablet (12.5 mg total) by mouth 2 (two) times daily with a meal.  60 tablet  5  . diltiazem (CARDIZEM SR) 60 MG 12 hr capsule Take 1 capsule (60 mg total) by mouth every 12 (twelve) hours.  60 capsule  5  . furosemide (LASIX) 40 MG tablet Take 1 tablet (40 mg total) by mouth 2 (two) times daily.  30 tablet  6  . glipiZIDE (GLUCOTROL) 5 MG tablet Take 1 tablet (5 mg total) by mouth 2 (two) times daily before a meal.  60 tablet  6  . glucose blood test strip Use as instructed  100 each  12  . glucose monitoring kit (FREESTYLE) monitoring kit 1 each by Does not apply route 4 (four) times daily - after meals and at bedtime. 1 month Diabetic Testing Supplies for QAC-QHS accuchecks.Any brand OK  1 each  1  . lisinopril (ZESTRIL) 2.5 MG tablet Take 1 tablet (2.5 mg total) by mouth daily.  30 tablet  6  . Rivaroxaban (XARELTO) 20 MG TABS tablet Take 1 tablet (20 mg total) by mouth daily before supper.  30 tablet  6  . spironolactone (ALDACTONE) 25 MG tablet Take 1 tablet (25 mg total) by mouth daily.  30 tablet  6   No current facility-administered medications on file prior to  visit.    No Known Allergies  Past Medical History  Diagnosis Date  . Diabetes mellitus   . Hypertension   . Systolic heart failure     EF is 40-45% by echo, December 2013  . Noncompliance   . CAD (coronary artery disease) Sept 2013    s/p cardiac cath showing occlusion of small RCA with collaterals  . Chronic anticoagulation     on coumadin  . Atrial fibrillation     Past Surgical History  Procedure Laterality Date  . S/p cardiac cath  Sept 2013  . Coronary artery bypass graft      2 vessels per patient Jacob [name])  July 2014  . Implantable cardioverter defibrillator implant      [place] in 07/2012; [employer]    History  Smoking status  . Current Every Day Smoker -- 0.50 packs/day for 20 years  . Types: Cigarettes  Smokeless  tobacco  . Never Used    Comment: Still with 3 - 4 per day.    he works as a as a Geophysical data processor.  History  Alcohol Use No    Family History  Problem Relation Age of Onset  . Diabetes Mother     Reviw of Systems:  Reviewed in the HPI.  All other systems are negative.  Physical Exam: Blood pressure 120/90, pulse 82, height _0  (1.702 m), weight 217 lb 1.9 oz (98.485 kg). General: Well developed, well nourished, in no acute distress.  Head: Normocephalic, atraumatic, sclera non-icteric, mucus membranes are moist,   Neck: Supple. Carotids are 2 + without bruits. No JVD  Lungs: Clear bilaterally to auscultation.  Heart: Irregularly irregular.  normal  S1 S2. He is tachycardic. He has a soft systolic murmur.  Abdomen: Soft, non-tender, non-distended with normal bowel sounds. No hepatomegaly. No rebound/guarding. No masses.  Msk:  Strength and tone are normal  Extremities: No clubbing or cyanosis. No edema.  Distal pedal pulses are 2+ and equal bilaterally.  Neuro: Alert and oriented X 3. Moves all extremities spontaneously.  Psych:  Responds to questions appropriately with a normal affect.  ECG: 06/18/2013: Atrial fibrillation with rapid ventricular response of 115. He has a left anterior fascicular block. Has nonspecific ST and T wave changes.  Assessment / Plan:

## 2013-06-18 NOTE — Assessment & Plan Note (Addendum)
Mr. Blodgett presents today after a 2 year absence. Since I have last  Seen him, he has had bypass surgery at [facility]. He also has had an ICD ( no recent interrogatioin that I can find)   He has a history of chronic systolic congestive heart failure. His most recent echocardiogram shows a persistent EF of 20-25%.  He has been on diltiazem 60 mg twice a day. I think that he would do much better if we increase his carvedilol and discontinue the diltiazem given his severe left ventricular dysfunction.  He's not been compliant with his medications. He was admitted to the hospital recently because it stopped his Lasix and is on medications and wound up with congestive heart failure and rapid atrial fibrillation. He appears to still be in rapid atrial fibrillation on exam.  I've encouraged him to stop smoking which will help reduce his risk of further cardiac problems.  I'll see him again in 3 months for followup visit. We will have him enrolled  in the device clinic for followup of his ICD.  He needs to be compliant with his medications.

## 2013-06-18 NOTE — Patient Instructions (Addendum)
Stop Diltiazem   Increase Coreg to 25 mg twice a day   Lab work today ( bmet )   STOP SMOKING   Pacemaker check today Enroll in pacemaker clinic  Appointment scheduled with Dr.Taylor  Friday 07/23/13 at 11:45 am.  Your physician recommends that you schedule a follow-up appointment in: 3 months 08/30/13 at 11:45 am with lab work ( bmet ) you may eat

## 2013-06-18 NOTE — Assessment & Plan Note (Signed)
We had his device interrogated today. He has occasional episodes of rapid atrial fibrillation. He also hasn't had an episode of ventricular tachycardia and was successfully cardioverted. We will have him followup in our office.

## 2013-06-21 ENCOUNTER — Encounter: Payer: Self-pay | Admitting: Internal Medicine

## 2013-06-21 LAB — MDC_IDC_ENUM_SESS_TYPE_INCLINIC
Date Time Interrogation Session: 20150327040000
HighPow Impedance: 50 Ohm
HighPow Impedance: 60 Ohm
Implantable Pulse Generator Serial Number: 108719
Lead Channel Impedance Value: 428 Ohm
Lead Channel Sensing Intrinsic Amplitude: 8 mV
Lead Channel Setting Pacing Pulse Width: 0.4 ms
Lead Channel Setting Sensing Sensitivity: 0.6 mV
MDC IDC SET LEADCHNL RV PACING AMPLITUDE: 2 V
MDC IDC STAT BRADY RV PERCENT PACED: 1 % — AB
Zone Setting Detection Interval: 273 ms
Zone Setting Detection Interval: 353 ms

## 2013-06-21 NOTE — Progress Notes (Signed)
ICD check in clinic. Normal device function. Threshold was unable to be performed due to elevated HR btwn 100-142. Sensing consistent with previous device measurements. Impedance trends stable over time. 22 VT episodes---max dur. >6 mins---irreg V-V---?AF w/RVR. 18.4K NSVT episodes---max dur. 35 sec---AF w/RVR. Coreg increased by Dr.Nahser. Patient received inappropriate defib shock on 11-20---noted during hosp. Patient aware of driving restrictions. Permanent AF + Xarelto. Histogram distribution shows frequent RVR. No changes made this session. Device programmed at appropriate safety margins. Device programmed to optimize intrinsic conduction. Estimated longevity 11.5 years. Pt will follow up with [facility] on 5-1.

## 2013-06-24 ENCOUNTER — Ambulatory Visit: Payer: Medicaid Other | Attending: Internal Medicine

## 2013-06-24 ENCOUNTER — Telehealth: Payer: Self-pay | Admitting: *Deleted

## 2013-06-24 NOTE — Telephone Encounter (Signed)
Received patient assistant forms from [facility] outpatient pharmacy for Laural Benes and Laural Benes for xarelto, I mailed 2 forms for patient to sign and complete, I have form for Dr Elease Hashimoto to sign for script on Friday 06/25/2013.

## 2013-07-07 ENCOUNTER — Ambulatory Visit: Payer: Medicaid - Out of State | Admitting: Internal Medicine

## 2013-07-13 ENCOUNTER — Encounter: Payer: Self-pay | Admitting: Internal Medicine

## 2013-07-15 ENCOUNTER — Ambulatory Visit: Payer: Medicaid Other | Admitting: Internal Medicine

## 2013-07-21 ENCOUNTER — Ambulatory Visit: Payer: Self-pay | Admitting: *Deleted

## 2013-07-23 ENCOUNTER — Encounter: Payer: Self-pay | Admitting: Internal Medicine

## 2013-07-23 ENCOUNTER — Other Ambulatory Visit (HOSPITAL_COMMUNITY): Payer: Self-pay | Admitting: Cardiology

## 2013-07-23 ENCOUNTER — Ambulatory Visit (INDEPENDENT_AMBULATORY_CARE_PROVIDER_SITE_OTHER): Payer: Medicaid - Out of State | Admitting: Internal Medicine

## 2013-07-23 VITALS — BP 102/65 | HR 54 | Ht 67.0 in | Wt 229.0 lb

## 2013-07-23 DIAGNOSIS — I4891 Unspecified atrial fibrillation: Secondary | ICD-10-CM

## 2013-07-23 DIAGNOSIS — Z95810 Presence of automatic (implantable) cardiac defibrillator: Secondary | ICD-10-CM

## 2013-07-23 DIAGNOSIS — I509 Heart failure, unspecified: Secondary | ICD-10-CM

## 2013-07-23 DIAGNOSIS — I5023 Acute on chronic systolic (congestive) heart failure: Secondary | ICD-10-CM

## 2013-07-23 DIAGNOSIS — I482 Chronic atrial fibrillation, unspecified: Secondary | ICD-10-CM

## 2013-07-23 DIAGNOSIS — I5022 Chronic systolic (congestive) heart failure: Secondary | ICD-10-CM

## 2013-07-23 DIAGNOSIS — I739 Peripheral vascular disease, unspecified: Secondary | ICD-10-CM

## 2013-07-23 DIAGNOSIS — I70219 Atherosclerosis of native arteries of extremities with intermittent claudication, unspecified extremity: Secondary | ICD-10-CM

## 2013-07-23 LAB — MDC_IDC_ENUM_SESS_TYPE_INCLINIC
Battery Remaining Longevity: 137 mo
Brady Statistic RV Percent Paced: 1 %
HighPow Impedance: 50 Ohm
HighPow Impedance: 66 Ohm
Implantable Pulse Generator Serial Number: 108719
Lead Channel Pacing Threshold Amplitude: 0.6 V
Lead Channel Sensing Intrinsic Amplitude: 8.7 mV
Lead Channel Setting Pacing Amplitude: 2 V
Lead Channel Setting Pacing Pulse Width: 0.4 ms
MDC IDC MSMT LEADCHNL RV IMPEDANCE VALUE: 439 Ohm
MDC IDC MSMT LEADCHNL RV PACING THRESHOLD PULSEWIDTH: 0.4 ms
MDC IDC SESS DTM: 20150501040000
MDC IDC SET LEADCHNL RV SENSING SENSITIVITY: 0.6 mV
MDC IDC SET ZONE DETECTION INTERVAL: 353 ms
Zone Setting Detection Interval: 273 ms

## 2013-07-23 NOTE — Assessment & Plan Note (Signed)
His device is working normally. Will follow. He has tachycardia at 175-180 which is mostly atrial fib but he may have had an episode of VT, which was not symptomatic and resolved spontaneously.

## 2013-07-23 NOTE — Assessment & Plan Note (Signed)
His heart failure symptoms are class 2 and appear to be well compensated.

## 2013-07-23 NOTE — Patient Instructions (Addendum)
Your physician wants you to follow-up in: 12 months with Dr Court Joy will receive a reminder letter in the mail two months in advance. If you don't receive a letter, please call our office to schedule the follow-up appointment.  Remote monitoring is used to monitor your Pacemaker or ICD from home. This monitoring reduces the number of office visits required to check your device to one time per year. It allows [place] to keep an eye on the functioning of your device to ensure it is working properly. You are scheduled for a device check from home on 10/25/13. You may send your transmission at any time that day. If you have a wireless device, the transmission will be sent automatically. After your physician reviews your transmission, you will receive a postcard with your next transmission date.   Your physician has requested that you have a lower extremity arterial exercise duplex. During this test, exercise and ultrasound are used to evaluate arterial blood flow in the legs. Allow one hour for this exam. There are no restrictions or special instructions.

## 2013-07-23 NOTE — Assessment & Plan Note (Signed)
He appears to have mostly asymptomatic atrial fib and is on chronic anti-coagulation. Will follow.

## 2013-07-23 NOTE — Progress Notes (Signed)
HPI Jacob [name] returns today for followup. He is a pleasant 58 yo man with an ICM, s/p CABG with HTN and dyslipidemia and atrial fib. He is s/p ICD, it sounds like due to sudden death, though this is not well documented. He had his ICD placed in [place] when he was visiting friends. He has no memory of the event. He feels well except for severe symptoms of claudication. He has a long h/o tobacco use but has cut down so that he now smokes less than a half pack of cigarettes daily. No Known Allergies   Current Outpatient Prescriptions  Medication Sig Dispense Refill  . atorvastatin (LIPITOR) 40 MG tablet Take 1 tablet (40 mg total) by mouth daily at 6 PM.  30 tablet  5  . carvedilol (COREG) 25 MG tablet Take 1 tablet (25 mg total) by mouth 2 (two) times daily.  60 tablet  6  . furosemide (LASIX) 40 MG tablet Take 1 tablet (40 mg total) by mouth 2 (two) times daily.  30 tablet  6  . glipiZIDE (GLUCOTROL) 5 MG tablet Take 1 tablet (5 mg total) by mouth 2 (two) times daily before a meal.  60 tablet  6  . lisinopril (ZESTRIL) 2.5 MG tablet Take 1 tablet (2.5 mg total) by mouth daily.  30 tablet  6  . Rivaroxaban (XARELTO) 20 MG TABS tablet Take 1 tablet (20 mg total) by mouth daily before supper.  30 tablet  6  . spironolactone (ALDACTONE) 25 MG tablet Take 1 tablet (25 mg total) by mouth daily.  30 tablet  6   No current facility-administered medications for this visit.     Past Medical History  Diagnosis Date  . Diabetes mellitus   . Hypertension   . Systolic heart failure     EF is 40-45% by echo, December 2013  . Noncompliance   . CAD (coronary artery disease) Sept 2013    s/p cardiac cath showing occlusion of small RCA with collaterals  . Chronic anticoagulation     on coumadin  . Atrial fibrillation     ROS:   All systems reviewed and negative except as noted in the HPI.   Past Surgical History  Procedure Laterality Date  . S/p cardiac cath  Sept 2013  . Coronary  artery bypass graft      2 vessels per patient [name]  July 2014  . Implantable cardioverter defibrillator implant      [place] in 07/2012; [employer]     Family History  Problem Relation Age of Onset  . Diabetes Mother      History   Social History  . Marital Status: Divorced    Spouse Name: N/A    Number of Children: 3  . Years of Education: N/A   Occupational History  . Unemployed    Social History Main Topics  . Smoking status: Current Every Day Smoker -- 0.50 packs/day for 20 years    Types: Cigarettes  . Smokeless tobacco: Never Used     Comment: Still with 3 - 4 per day.   . Alcohol Use: No  . Drug Use: No  . Sexual Activity: No   Other Topics Concern  . Not on file   Social History Narrative   Has an apartment with a roommate. He was living on the streets in 2013-01-24.  He reports that his father died in [place] in 01/24/13.  He is divorced.  He is  no longer estranged from his son, but still from his daughter who lives locally.  Neither of his parents, nor any siblings have any history of CAD.     BP 102/65  Pulse 54  Ht 5\' 7"  (1.702 m)  Wt 229 lb (103.874 kg)  BMI 35.86 kg/m2  Physical Exam:  stable appearing NAD HEENT: Unremarkable Neck:  No JVD, no thyromegally Back:  No CVA tenderness Lungs:  Clear with no wheezes HEART:  Regular rate rhythm, no murmurs, no rubs, no clicks Abd:  soft, positive bowel sounds, no organomegally, no rebound, no guarding Ext:  trace pulses in feet, no edema, no cyanosis, no clubbing Skin:  No rashes no nodules Neuro:  CN II through XII intact, motor grossly intact  EKG - nsr  DEVICE  Normal device function.  See PaceArt for details.   Assess/Plan:

## 2013-07-23 NOTE — Assessment & Plan Note (Signed)
The patient is trying to cut back on tobacco use. He will be scheduled for dopplers of the lower extremities. He states that pain in his legs when he walks is his biggest limitation.

## 2013-07-26 ENCOUNTER — Ambulatory Visit (HOSPITAL_COMMUNITY): Payer: Medicaid - Out of State | Attending: Cardiovascular Disease | Admitting: Cardiology

## 2013-07-26 DIAGNOSIS — I70219 Atherosclerosis of native arteries of extremities with intermittent claudication, unspecified extremity: Secondary | ICD-10-CM | POA: Insufficient documentation

## 2013-07-26 DIAGNOSIS — I739 Peripheral vascular disease, unspecified: Secondary | ICD-10-CM

## 2013-07-26 NOTE — Progress Notes (Signed)
Lower arterial doppler and bilateral duplex complete 

## 2013-08-04 ENCOUNTER — Encounter: Payer: Self-pay | Admitting: Cardiovascular Disease

## 2013-08-04 ENCOUNTER — Ambulatory Visit (INDEPENDENT_AMBULATORY_CARE_PROVIDER_SITE_OTHER): Payer: Medicaid - Out of State | Admitting: Cardiovascular Disease

## 2013-08-04 VITALS — BP 146/87 | HR 86 | Ht 67.0 in | Wt 227.7 lb

## 2013-08-04 DIAGNOSIS — Z79899 Other long term (current) drug therapy: Secondary | ICD-10-CM

## 2013-08-04 DIAGNOSIS — I739 Peripheral vascular disease, unspecified: Secondary | ICD-10-CM

## 2013-08-04 DIAGNOSIS — D689 Coagulation defect, unspecified: Secondary | ICD-10-CM

## 2013-08-04 NOTE — Patient Instructions (Addendum)
Dr. Allyson Sabal has ordered a peripheral angiogram to be done at [facility].  This procedure is going to look at the bloodflow in your lower extremities.  If Dr. Allyson Sabal is able to open up the arteries, you will have to spend one night in the hospital.  If he is not able to open the arteries, you will be able to go home that same day.    After the procedure, you will not be allowed to drive for 3 days or push, pull, or lift anything greater than 10 lbs for one week.    You will be required to have the following tests prior to the procedure:  1. Blood work-the blood work can be done no more than 7 days prior to the procedure.  It can be done at any [facility] lab.  There is one downstairs on the first floor of this building and one in the [facility] ([address])  *REPS TBA after dopplers are done Left groin  You need lower extremity arterial dopplers prior to the angiogram- During this test, ultrasound is used to evaluate arterial blood flow in the legs. Allow approximately one hour for this exam.   Stop Xarelto 2 days prior to the angiogram

## 2013-08-04 NOTE — Progress Notes (Signed)
08/04/2013 Jacob [name]   [birth year]  [id number]  Primary Physician [name], MD Primary Cardiologist: Runell Gess MD [name]   HPI:  Jacob [name] is a 58 year old moderately overweight [place] male father of 3 children referred by Dr. Sharrell Ku for evaluation of claudication. He is a history of coronary artery disease status post bypass grafting x2 at [facility] April 2013. 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 Ladona Ridgel. The problems include 100-pack-year history of tobacco abuse currently trying to stop smoking at least one pack per day, hypertension, hyperlipidemia and diabetes. He does have chronic A. Fib on oral anticoagulation. He complains of occasional chest pain as well as dyspnea. Over the last year has developed right greater than left lungs" at less than 50 feet with Dopplers that showed ABIs of 0.6 range biphasic popliteal waveforms.   Current Outpatient Prescriptions  Medication Sig Dispense Refill  . atorvastatin (LIPITOR) 40 MG tablet Take 1 tablet (40 mg total) by mouth daily at 6 PM.  30 tablet  5  . carvedilol (COREG) 25 MG tablet Take 1 tablet (25 mg total) by mouth 2 (two) times daily.  60 tablet  6  . furosemide (LASIX) 40 MG tablet Take 1 tablet (40 mg total) by mouth 2 (two) times daily.  30 tablet  6  . glipiZIDE (GLUCOTROL) 5 MG tablet Take 1 tablet (5 mg total) by mouth 2 (two) times daily before a meal.  60 tablet  6  . lisinopril (ZESTRIL) 2.5 MG tablet Take 1 tablet (2.5 mg total) by mouth daily.  30 tablet  6  . Rivaroxaban (XARELTO) 20 MG TABS tablet Take 1 tablet (20 mg total) by mouth daily before supper.  30 tablet  6  . spironolactone (ALDACTONE) 25 MG tablet Take 1 tablet (25 mg total) by mouth daily.  30 tablet  6   No current facility-administered medications for this visit.    No Known Allergies  History   Social History  . Marital Status: Divorced   Spouse Name: N/A    Number of Children: 3  . Years of Education: N/A   Occupational History  . Unemployed    Social History Main Topics  . Smoking status: Current Every Day Smoker -- 0.50 packs/day for 20 years    Types: Cigarettes  . Smokeless tobacco: Never Used     Comment: Still with 3 - 4 per day.   . Alcohol Use: No  . Drug Use: No  . Sexual Activity: No   Other Topics Concern  . Not on file   Social History Narrative   Has an apartment with a roommate. He was living on the streets in February 12, 2013.  He reports that his father died in [place] in 12-Feb-2013.  He is divorced.  He is no longer estranged from his son, but still from his daughter who lives locally.  Neither of his parents, nor any siblings have any history of CAD.     Review of Systems: General: negative for chills, fever, night sweats or weight changes.  Cardiovascular: negative for chest pain, dyspnea on exertion, edema, orthopnea, palpitations, paroxysmal nocturnal dyspnea or shortness of breath Dermatological: negative for rash Respiratory: negative for cough or wheezing Urologic: negative for hematuria Abdominal: negative for nausea, vomiting, diarrhea, bright red blood per rectum, melena, or hematemesis Neurologic: negative for visual changes, syncope, or dizziness All other systems reviewed and are otherwise negative except  as noted above.    Blood pressure 146/87, pulse 86, height 5\' 7"  (1.702 m), weight 227 lb 11.2 oz (103.284 kg).  General appearance: alert and no distress Neck: no adenopathy, no carotid bruit, no JVD, supple, symmetrical, trachea midline and thyroid not enlarged, symmetric, no tenderness/mass/nodules Lungs: clear to auscultation bilaterally Heart: irregularly irregular rhythm Extremities: extremities normal, atraumatic, no cyanosis or edema and absent pedal pulses, right femoral bruit, 1+ left femoral pulse  EKG not performed today  ASSESSMENT AND PLAN:   Claudication The  patient was referred to me by Dr. Sharrell KuGreg Taylor for evaluation of claudication and PAD. This was factors include greater than 100 pack years of tobacco abuse, diabetes, hypertension and hyperlipidemia. He had coronary artery bypass grafting 2 years ago at [facility] and has ischemic adenopathy. [place] back in 2013 followed by Dr. Ladona Ridgelaylor. He also has chronic A. Fib on Xarelto oral anticoagulation. He gives a history of right greater than left lobe; condition progressively last year now limiting his ability ambulate at less than 50 feet. [facility] revealing ABIs in the 0.6 range bilaterally with triphasic femoral wave forms, biphasic popliteal and tibial waveforms.I am going to obtain lower extremity arterial duplex studies to assess the location of his most severe blockages and arrange for him to undergo angiography of the left femoral approach. He will need to stop his oral athetoid coagulation 2 days prior to the procedure. We talked about cardiac risk factor modification including smoking cessation.      Runell GessJonathan J. Adena Sima MD FACP,FACC,FAHA, [facility] 08/04/2013 9:18 AM

## 2013-08-04 NOTE — Assessment & Plan Note (Signed)
The patient was referred to me by Dr. Sharrell Ku for evaluation of claudication and PAD. This was factors include greater than 100 pack years of tobacco abuse, diabetes, hypertension and hyperlipidemia. He had coronary artery bypass grafting 2 years ago at [facility] and has ischemic adenopathy. He had AICD placed in [place] back in 2013 followed by Dr. Ladona Ridgel. He also has chronic A. Fib on Xarelto oral anticoagulation. He gives a history of right greater than left lobe; condition progressively last year now limiting his ability ambulate at less than 50 feet. [facility] revealing ABIs in the 0.6 range bilaterally with triphasic femoral wave forms, biphasic popliteal and tibial waveforms.I am going to obtain lower extremity arterial duplex studies to assess the location of his most severe blockages and arrange for him to undergo angiography of the left femoral approach. He will need to stop his oral athetoid coagulation 2 days prior to the procedure. We talked about cardiac risk factor modification including smoking cessation.

## 2013-08-12 ENCOUNTER — Ambulatory Visit: Payer: Medicaid Other | Attending: Internal Medicine

## 2013-08-13 ENCOUNTER — Inpatient Hospital Stay (HOSPITAL_COMMUNITY): Admission: RE | Admit: 2013-08-13 | Payer: Medicaid - Out of State | Source: Ambulatory Visit

## 2013-08-20 ENCOUNTER — Ambulatory Visit (HOSPITAL_COMMUNITY)
Admission: RE | Admit: 2013-08-20 | Discharge: 2013-08-20 | Disposition: A | Discharge status: Home / Self Care | Payer: Medicaid - Out of State | Source: Ambulatory Visit | Attending: Internal Medicine | Admitting: Internal Medicine

## 2013-08-20 DIAGNOSIS — I739 Peripheral vascular disease, unspecified: Secondary | ICD-10-CM | POA: Insufficient documentation

## 2013-08-20 DIAGNOSIS — I70219 Atherosclerosis of native arteries of extremities with intermittent claudication, unspecified extremity: Secondary | ICD-10-CM

## 2013-08-20 NOTE — Progress Notes (Signed)
Arterial Duplex Lower Ext. Completed. Jamaiya Tunnell, BS, RDMS, RVT  

## 2013-08-25 ENCOUNTER — Telehealth: Payer: Self-pay | Admitting: Cardiovascular Disease

## 2013-08-25 ENCOUNTER — Telehealth: Payer: Self-pay | Admitting: *Deleted

## 2013-08-25 LAB — BASIC METABOLIC PANEL
BUN: 31 mg/dL — ABNORMAL HIGH (ref 6–23)
CALCIUM: 8.5 mg/dL (ref 8.4–10.5)
CO2: 28 mEq/L (ref 19–32)
CREATININE: 1.04 mg/dL (ref 0.50–1.35)
Chloride: 98 mEq/L (ref 96–112)
Glucose, Bld: 176 mg/dL — ABNORMAL HIGH (ref 70–99)
Potassium: 4.9 mEq/L (ref 3.5–5.3)
SODIUM: 133 meq/L — AB (ref 135–145)

## 2013-08-25 LAB — APTT: APTT: 38 s — AB (ref 24–37)

## 2013-08-25 LAB — CBC
HEMATOCRIT: 29.9 % — AB (ref 39.0–52.0)
HEMOGLOBIN: 8.3 g/dL — AB (ref 13.0–17.0)
MCH: 19.3 pg — ABNORMAL LOW (ref 26.0–34.0)
MCHC: 27.8 g/dL — ABNORMAL LOW (ref 30.0–36.0)
MCV: 69.5 fL — ABNORMAL LOW (ref 78.0–100.0)
Platelets: 211 10*3/uL (ref 150–400)
RBC: 4.3 MIL/uL (ref 4.22–5.81)
RDW: 19.5 % — ABNORMAL HIGH (ref 11.5–15.5)
WBC: 7.6 10*3/uL (ref 4.0–10.5)

## 2013-08-25 LAB — PROTIME-INR
INR: 1.63 — AB (ref <1.50)
Prothrombin Time: 19 seconds — ABNORMAL HIGH (ref 11.6–15.2)

## 2013-08-25 NOTE — Telephone Encounter (Signed)
Spoke to patient. He is aware of procedure for Monday 08/30/13. RN asked if he went to have lab work done for procedure, He stated no, but will go today.

## 2013-08-25 NOTE — Telephone Encounter (Signed)
Message copied by Tobin Chad on Wed Aug 25, 2013  9:34 AM ------      Message from: Runell Gess      Created: Sat Aug 21, 2013 12:46 PM       Abn LEA. Sched for arterial angio 6/8 ------

## 2013-08-25 NOTE — Telephone Encounter (Signed)
Pt lost his lab order and wants his lab work today. Would you fax a new order downstairs asap please.

## 2013-08-25 NOTE — Telephone Encounter (Signed)
LEFT MESSAGE - LAB ARE AVAILABLE TO [facility]

## 2013-08-27 ENCOUNTER — Telehealth: Payer: Self-pay | Admitting: *Deleted

## 2013-08-27 ENCOUNTER — Other Ambulatory Visit: Payer: Self-pay | Admitting: *Deleted

## 2013-08-27 DIAGNOSIS — D649 Anemia, unspecified: Secondary | ICD-10-CM

## 2013-08-27 DIAGNOSIS — R791 Abnormal coagulation profile: Secondary | ICD-10-CM

## 2013-08-27 NOTE — Telephone Encounter (Signed)
Dr Allyson Sabal returned call. Results given. HGB Per Dr Reeves Forth WITH PROCEDURE ON 08/30/13. DO STAT LABS --CBC, INR.Marland Kitchen HAVE PATIENT TO COME EARLY.  Notified patient . Patient stated he took XARELTO  ALREADY TODAY ,INFORMED HIM TO HOLD XARELTO FOR NOW. --PATIENT VERBALIZED UNDERSTANDING.  RN spoke to scheduler to inform hospital

## 2013-08-27 NOTE — Telephone Encounter (Signed)
   Dr Nicki Guadalajara reviewed labs - hbg 8.3. Will  attempt to call Dr Allyson Sabal.  CALLED LEFT MESSAGE on Dr Allyson Sabal, to review labs for -PV LOWER EXTREMITIES-

## 2013-08-29 NOTE — Progress Notes (Signed)
No reps for this case

## 2013-08-30 ENCOUNTER — Ambulatory Visit: Payer: Self-pay | Admitting: Cardiovascular Disease

## 2013-08-30 ENCOUNTER — Encounter (HOSPITAL_COMMUNITY): Payer: Self-pay | Admitting: General Practice

## 2013-08-30 ENCOUNTER — Ambulatory Visit (HOSPITAL_COMMUNITY)
Admission: RE | Admit: 2013-08-30 | Discharge: 2013-08-31 | Disposition: A | Discharge status: Home / Self Care | Payer: Medicaid Other | Source: Ambulatory Visit | Attending: Cardiovascular Disease | Admitting: Cardiovascular Disease

## 2013-08-30 ENCOUNTER — Encounter (HOSPITAL_COMMUNITY)
Admission: RE | Disposition: A | Discharge status: Home / Self Care | Payer: Self-pay | Source: Ambulatory Visit | Attending: Cardiovascular Disease

## 2013-08-30 DIAGNOSIS — D509 Iron deficiency anemia, unspecified: Secondary | ICD-10-CM | POA: Insufficient documentation

## 2013-08-30 DIAGNOSIS — I70219 Atherosclerosis of native arteries of extremities with intermittent claudication, unspecified extremity: Secondary | ICD-10-CM | POA: Insufficient documentation

## 2013-08-30 DIAGNOSIS — E119 Type 2 diabetes mellitus without complications: Secondary | ICD-10-CM | POA: Insufficient documentation

## 2013-08-30 DIAGNOSIS — I2510 Atherosclerotic heart disease of native coronary artery without angina pectoris: Secondary | ICD-10-CM | POA: Insufficient documentation

## 2013-08-30 DIAGNOSIS — E785 Hyperlipidemia, unspecified: Secondary | ICD-10-CM | POA: Diagnosis not present

## 2013-08-30 DIAGNOSIS — Z7901 Long term (current) use of anticoagulants: Secondary | ICD-10-CM | POA: Insufficient documentation

## 2013-08-30 DIAGNOSIS — I10 Essential (primary) hypertension: Secondary | ICD-10-CM | POA: Diagnosis not present

## 2013-08-30 DIAGNOSIS — Z95810 Presence of automatic (implantable) cardiac defibrillator: Secondary | ICD-10-CM

## 2013-08-30 DIAGNOSIS — I4891 Unspecified atrial fibrillation: Secondary | ICD-10-CM | POA: Diagnosis not present

## 2013-08-30 DIAGNOSIS — I708 Atherosclerosis of other arteries: Secondary | ICD-10-CM | POA: Insufficient documentation

## 2013-08-30 DIAGNOSIS — F17200 Nicotine dependence, unspecified, uncomplicated: Secondary | ICD-10-CM | POA: Insufficient documentation

## 2013-08-30 DIAGNOSIS — I7092 Chronic total occlusion of artery of the extremities: Secondary | ICD-10-CM | POA: Insufficient documentation

## 2013-08-30 DIAGNOSIS — I739 Peripheral vascular disease, unspecified: Secondary | ICD-10-CM | POA: Diagnosis present

## 2013-08-30 DIAGNOSIS — I5022 Chronic systolic (congestive) heart failure: Secondary | ICD-10-CM

## 2013-08-30 HISTORY — DX: Pure hypercholesterolemia, unspecified: E78.00

## 2013-08-30 HISTORY — DX: Presence of automatic (implantable) cardiac defibrillator: Z95.810

## 2013-08-30 HISTORY — PX: ILIAC ARTERY STENT: SHX1786

## 2013-08-30 HISTORY — DX: Acute myocardial infarction, unspecified: I21.9

## 2013-08-30 HISTORY — PX: LOWER EXTREMITY ANGIOGRAM: SHX5508

## 2013-08-30 HISTORY — DX: Heart failure, unspecified: I50.9

## 2013-08-30 HISTORY — DX: Type 2 diabetes mellitus without complications: E11.9

## 2013-08-30 LAB — POCT ACTIVATED CLOTTING TIME
ACTIVATED CLOTTING TIME: 166 s
ACTIVATED CLOTTING TIME: 215 s
ACTIVATED CLOTTING TIME: 143 s

## 2013-08-30 LAB — GLUCOSE, CAPILLARY
Glucose-Capillary: 300 mg/dL — ABNORMAL HIGH (ref 70–99)
Glucose-Capillary: 181 mg/dL — ABNORMAL HIGH (ref 70–99)
Glucose-Capillary: 135 mg/dL — ABNORMAL HIGH (ref 70–99)
Glucose-Capillary: 120 mg/dL — ABNORMAL HIGH (ref 70–99)

## 2013-08-30 LAB — PROTIME-INR
INR: 1.39 (ref 0.00–1.49)
Prothrombin Time: 16.7 seconds — ABNORMAL HIGH (ref 11.6–15.2)

## 2013-08-30 LAB — CBC
HEMATOCRIT: 30.1 % — AB (ref 39.0–52.0)
HEMOGLOBIN: 8.3 g/dL — AB (ref 13.0–17.0)
MCH: 19.4 pg — AB (ref 26.0–34.0)
MCHC: 27.6 g/dL — AB (ref 30.0–36.0)
MCV: 70.3 fL — ABNORMAL LOW (ref 78.0–100.0)
Platelets: 190 10*3/uL (ref 150–400)
RBC: 4.28 MIL/uL (ref 4.22–5.81)
RDW: 20.4 % — AB (ref 11.5–15.5)
WBC: 7 10*3/uL (ref 4.0–10.5)

## 2013-08-30 SURGERY — ANGIOGRAM, LOWER EXTREMITY
Anesthesia: LOCAL | Laterality: Right

## 2013-08-30 MED ORDER — HEPARIN SODIUM (PORCINE) 1000 UNIT/ML IJ SOLN
INTRAMUSCULAR | Status: AC
  Filled 2013-08-30: qty 1

## 2013-08-30 MED ORDER — LIDOCAINE HCL (PF) 1 % IJ SOLN
INTRAMUSCULAR | Status: AC
  Filled 2013-08-30: qty 30

## 2013-08-30 MED ORDER — SPIRONOLACTONE 25 MG PO TABS
25.0000 mg | ORAL_TABLET | Freq: Every day | ORAL | Status: DC
  Administered 2013-08-30 – 2013-08-31 (×2): 25 mg via ORAL
  Filled 2013-08-30 (×3): qty 1

## 2013-08-30 MED ORDER — FUROSEMIDE 40 MG PO TABS
40.0000 mg | ORAL_TABLET | Freq: Two times a day (BID) | ORAL | Status: DC
  Administered 2013-08-30 – 2013-08-31 (×2): 40 mg via ORAL
  Filled 2013-08-30 (×4): qty 1

## 2013-08-30 MED ORDER — SODIUM CHLORIDE 0.9 % IV SOLN
INTRAVENOUS | Status: DC
  Administered 2013-08-30: 11:00:00 via INTRAVENOUS

## 2013-08-30 MED ORDER — HEPARIN (PORCINE) IN NACL 2-0.9 UNIT/ML-% IJ SOLN
INTRAMUSCULAR | Status: AC
  Filled 2013-08-30: qty 1000

## 2013-08-30 MED ORDER — CARVEDILOL 25 MG PO TABS
25.0000 mg | ORAL_TABLET | Freq: Two times a day (BID) | ORAL | Status: DC
  Administered 2013-08-30 – 2013-08-31 (×2): 25 mg via ORAL
  Filled 2013-08-30 (×3): qty 1

## 2013-08-30 MED ORDER — LISINOPRIL 2.5 MG PO TABS
2.5000 mg | ORAL_TABLET | Freq: Every day | ORAL | Status: DC
  Administered 2013-08-30 – 2013-08-31 (×2): 2.5 mg via ORAL
  Filled 2013-08-30 (×3): qty 1

## 2013-08-30 MED ORDER — GLIPIZIDE 5 MG PO TABS
5.0000 mg | ORAL_TABLET | Freq: Two times a day (BID) | ORAL | Status: DC
  Administered 2013-08-30 – 2013-08-31 (×2): 5 mg via ORAL
  Filled 2013-08-30 (×4): qty 1

## 2013-08-30 MED ORDER — FENTANYL CITRATE 0.05 MG/ML IJ SOLN
INTRAMUSCULAR | Status: AC
  Filled 2013-08-30: qty 2

## 2013-08-30 MED ORDER — ASPIRIN 81 MG PO CHEW
CHEWABLE_TABLET | ORAL | Status: AC
  Filled 2013-08-30: qty 1

## 2013-08-30 MED ORDER — SODIUM CHLORIDE 0.9 % IJ SOLN: 3.0000 mL | INTRAMUSCULAR | Status: DC | PRN

## 2013-08-30 MED ORDER — SODIUM CHLORIDE 0.9 % IV SOLN: INTRAVENOUS | Status: AC

## 2013-08-30 MED ORDER — ASPIRIN 81 MG PO CHEW
81.0000 mg | CHEWABLE_TABLET | ORAL | Status: AC
  Administered 2013-08-30: 81 mg via ORAL

## 2013-08-30 MED ORDER — HYDRALAZINE HCL 20 MG/ML IJ SOLN: 10.0000 mg | INTRAMUSCULAR | Status: DC | PRN

## 2013-08-30 MED ORDER — HEPARIN (PORCINE) IN NACL 100-0.45 UNIT/ML-% IJ SOLN
1100.0000 [IU]/h | INTRAMUSCULAR | Status: DC
  Administered 2013-08-31: 1100 [IU]/h via INTRAVENOUS
  Filled 2013-08-30: qty 250

## 2013-08-30 MED ORDER — MORPHINE SULFATE 2 MG/ML IJ SOLN: 2.0000 mg | INTRAMUSCULAR | Status: DC | PRN

## 2013-08-30 NOTE — Progress Notes (Addendum)
ANTICOAGULATION CONSULT NOTE - Initial Consult  Pharmacy Consult for Heparin Indication: Post-cath, afib  No Known Allergies  Patient Measurements: Height: 5\' 6"  (167.6 cm) Weight: 225 lb (102.059 kg) IBW/kg (Calculated) : 63.8 Heparin Dosing Weight: 86 kg  Vital Signs: Temp: 98.4 F (36.9 C) (06/08 1040) Temp src: Oral (06/08 1040) BP: 137/90 mmHg (06/08 1715) Pulse Rate: 103 (06/08 1716)  Labs:  Recent Labs  08/30/13 1128  HGB 8.3*  HCT 30.1*  PLT 190  LABPROT 16.7*  INR 1.39    Estimated Creatinine Clearance: 88.7 ml/min (by C-G formula based on Cr of 1.04).   Medical History: Past Medical History  Diagnosis Date  . Hypertension   . Systolic heart failure     EF is 40-45% by echo, December 2013  . Noncompliance   . CAD (coronary artery disease) Sept 2013    s/p cardiac cath showing occlusion of small RCA with collaterals  . Chronic anticoagulation     on coumadin  . Atrial fibrillation   . Peripheral arterial disease   . Automatic implantable cardioverter-defibrillator in situ   . High cholesterol   . CHF (congestive heart failure)   . Heart murmur   . Myocardial infarction 2014  . Type II diabetes mellitus     Medications:  Prescriptions prior to admission  Medication Sig Dispense Refill  . carvedilol (COREG) 25 MG tablet Take 1 tablet (25 mg total) by mouth 2 (two) times daily.  60 tablet  6  . furosemide (LASIX) 40 MG tablet Take 1 tablet (40 mg total) by mouth 2 (two) times daily.  30 tablet  6  . glipiZIDE (GLUCOTROL) 5 MG tablet Take 1 tablet (5 mg total) by mouth 2 (two) times daily before a meal.  60 tablet  6  . lisinopril (ZESTRIL) 2.5 MG tablet Take 1 tablet (2.5 mg total) by mouth daily.  30 tablet  6  . spironolactone (ALDACTONE) 25 MG tablet Take 1 tablet (25 mg total) by mouth daily.  30 tablet  6  . Rivaroxaban (XARELTO) 20 MG TABS tablet Take 1 tablet (20 mg total) by mouth daily before supper.  30 tablet  6    Assessment:  Heparin>>Xarelto, post cath 58 y/o M with h/o CAD and CABG 4/13, ICD 2013. Cath today 6/8: successful right external iliac artery stenting with totally occluded SFAs bilaterally probably best treated medically at this point because of his comorbidities and severe cardiac issues. Last dose of Xarelto: unknown and pt does not speak [place] per RN. Sheath out 1930.  PMH: heavy tobacco, HTN, HLD, DM, CAF  Labs: Hgb 8.3. Plts 190, Scr 1.04, INR 1.39 (on Xarelto), aPTT 38, no baseline heparin level.   Goal of Therapy:  Heparin level 0.3-0.7 units/ml Monitor platelets by anticoagulation protocol: Yes   Plan:  Heparin (no bolus) 1100 units/hr at 0130 tonight (6hrs after sheath out) Heparin level and CBC in AM MD plans to transition back to Xarelto tomorrow.  Chalene Treu S. Merilynn Finland, PharmD, [facility] Clinical Staff Pharmacist Pager [phone]  Pasty Spillers 08/30/2013,5:59 PM

## 2013-08-30 NOTE — CV Procedure (Addendum)
Govanni Eberspacher is a 58 y.o. male    [id number] LOCATION:  FACILITY: [facility]  PHYSICIAN: Nanetta Batty, M.D. Aug 19, 1955   DATE OF PROCEDURE:  08/30/2013  DATE OF DISCHARGE:     PV Angiogram/Intervention    History obtained from chart review.Mr. Umsted is a 58 year old moderately overweight [place] male father of 3 children referred by Dr. Sharrell Ku for evaluation of claudication. He is a history of coronary artery disease status post bypass grafting x2 at [facility] April 2013. 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 Ladona Ridgel. The problems include 100-pack-year history of tobacco abuse currently trying to stop smoking at least one pack per day, hypertension, hyperlipidemia and diabetes. He does have chronic A. Fib on oral anticoagulation. He complains of occasional chest pain as well as dyspnea. Over the last year has developed right greater than left lungs" at less than 50 feet with Dopplers that showed ABIs of 0.6 range biphasic popliteal waveforms    PROCEDURE DESCRIPTION:   The patient was brought to the second floor [facility]iac cath lab in the postabsorptive state. He was  premedicated with Valium 5 mg by mouth, IV fentanyl. His left groinwas prepped and shaved in usual sterile fashion. Xylocaine 1% was used for local anesthesia. A 5 French sheath was inserted into the left common femoral  artery using standard Seldinger technique. A 5 French pigtail catheter was placed in the distal abdominal aorta at the level of the renal arteries. Abdominal aortography, bilateral iliac angiography with bifemoral runoff was performed using bolus chase digital subtraction settable technique. Visipaque dye was used for the entirety of the case. Retrograde aortic pressure was monitored during the case.   HEMODYNAMICS:    AO SYSTOLIC/AO DIASTOLIC: 140/80   Angiographic Data:   1: Abdominal aorta-renal arteries were widely patent. The  infrarenal abdominal aorta was free of significant atherosclerotic changes.  2: Left lower extremity-the left SFA was occluded at its origin reconstituted in the adductor canal by profunda femoris collaterals. There was two-vessel runoff with an occluded anterior tibial  3: Right lower extremity-there was a 75% fairly focal eccentric right external iliac artery stenosis with a 30 mm pullback gradient, totally occluded right SFA with reconstitution in the adductor canal by profunda femoris collaterals. There was two-vessel runoff with an occluded anterior tibial  IMPRESSION:Mr. Denault had bilateral SFA occlusion most suited for surgical revascularization. He is two-vessel runoff bilaterally. He does have a high-grade right external iliac artery stenosis which will need to be stented. He is on Xarelto anticoagulation because of chronic A. Fib atrial fibrillation with iron deficiency anemia and a hemoglobin of 8.3 which will need to be worked up as an outpatient.  Procedure Description:contralateral access was obtained with a 5 [place] crossover catheter, Glidewire and 0.35 cm Versicore  Wire.a total of 10,000 units of heparin was administered intravenously with a peak ACT of 215. Total contrast administered the patient was 187 cc. I then advanced a 6 [place] multipurpose 45 cm crossover sheath across the iliac bifurcation into the right external iliac artery. I performed direct stenting with a 10 mm x 40 mm long Smart Nitinol self-expanding stent covering the lesion and postdilated with a 7 mm but 2 cm Maverick balloon resulting reduction of 75% focal right external iliac artery stenosis to less than 20% residual with excellent antegrade result. The patient tolerated the procedure well. The sheath was then withdrawn across the bifurcation and exchanged over an oh 0.25  cm wire for a short 6 [place] sheath.  Final Impression: successful right external iliac artery stenting with totally occluded SFAs bilaterally  probably best treated medically at this point because of his comorbidities and severe cardiac issues. The sheath will be removed once the ACT falls below 170 pressure will be held. He'll be gently hydrated overnight and discharge him in the morning. Because of his anemia and the fact that he is on an oral anticoagulant I am not going to start antiplatelet agent. I will start IV hep 6 hours after sheath removal with plans to restart Xarelto tomorrow. Will need OP GI eval for Fe deficiency anemia.    Runell GessJonathan J Lyza Houseworth. MD, [facility] CenterFACC 08/30/2013 4:21 PM

## 2013-08-30 NOTE — Care Management Note (Addendum)
  Page 1 of 1   08/30/2013     5:17:49 PM CARE MANAGEMENT NOTE 08/30/2013  Patient:  Jacob [name], Jacob [name]   Account Number:  [id number]  Date Initiated:  08/30/2013  Documentation initiated by:  Donato Schultz  Subjective/Objective Assessment:   claudication     Action/Plan:   CM to follow for dispostion needs   Anticipated DC Date:  08/31/2013   Anticipated DC Plan:  HOME/SELF CARE         Choice offered to / List presented to:             Status of service:  Completed, signed off Medicare Important Message given?   (If response is "NO", the following Medicare IM given date fields will be blank) Date Medicare IM given:   Date Additional Medicare IM given:    Discharge Disposition:  HOME/SELF CARE  Per UR Regulation:    If discussed at Long Length of Stay Meetings, dates discussed:    Comments:  Sakai Wolford RN, BSN, MSHL, CCM  Nurse - Case Manager, (Unit [phone]  08/30/2013 Med Review:   Hx/o actively on Xarelto anticoagulation because of chronic  atrial fibrillation

## 2013-08-31 ENCOUNTER — Other Ambulatory Visit: Payer: Self-pay | Admitting: Physician Assistant

## 2013-08-31 DIAGNOSIS — I4891 Unspecified atrial fibrillation: Secondary | ICD-10-CM

## 2013-08-31 DIAGNOSIS — I5022 Chronic systolic (congestive) heart failure: Secondary | ICD-10-CM

## 2013-08-31 DIAGNOSIS — I739 Peripheral vascular disease, unspecified: Secondary | ICD-10-CM

## 2013-08-31 DIAGNOSIS — Z95810 Presence of automatic (implantable) cardiac defibrillator: Secondary | ICD-10-CM

## 2013-08-31 DIAGNOSIS — I708 Atherosclerosis of other arteries: Secondary | ICD-10-CM | POA: Diagnosis not present

## 2013-08-31 DIAGNOSIS — I509 Heart failure, unspecified: Secondary | ICD-10-CM

## 2013-08-31 DIAGNOSIS — I2510 Atherosclerotic heart disease of native coronary artery without angina pectoris: Secondary | ICD-10-CM

## 2013-08-31 LAB — BASIC METABOLIC PANEL
BUN: 21 mg/dL (ref 6–23)
CHLORIDE: 96 meq/L (ref 96–112)
CO2: 29 meq/L (ref 19–32)
Calcium: 8.6 mg/dL (ref 8.4–10.5)
Creatinine, Ser: 0.83 mg/dL (ref 0.50–1.35)
GFR calc Af Amer: >90 mL/min (ref >90)
Glucose, Bld: 116 mg/dL — ABNORMAL HIGH (ref 70–99)
POTASSIUM: 4.9 meq/L (ref 3.7–5.3)
SODIUM: 133 meq/L — AB (ref 137–147)

## 2013-08-31 LAB — CBC
HEMATOCRIT: 30.9 % — AB (ref 39.0–52.0)
Hemoglobin: 8.4 g/dL — ABNORMAL LOW (ref 13.0–17.0)
MCH: 18.9 pg — AB (ref 26.0–34.0)
MCHC: 27.2 g/dL — ABNORMAL LOW (ref 30.0–36.0)
MCV: 69.4 fL — ABNORMAL LOW (ref 78.0–100.0)
Platelets: 206 10*3/uL (ref 150–400)
RBC: 4.45 MIL/uL (ref 4.22–5.81)
RDW: 20.3 % — ABNORMAL HIGH (ref 11.5–15.5)
WBC: 6.9 10*3/uL (ref 4.0–10.5)

## 2013-08-31 LAB — GLUCOSE, CAPILLARY
GLUCOSE-CAPILLARY: 257 mg/dL — AB (ref 70–99)
Glucose-Capillary: 121 mg/dL — ABNORMAL HIGH (ref 70–99)
Glucose-Capillary: 119 mg/dL — ABNORMAL HIGH (ref 70–99)

## 2013-08-31 LAB — HEPARIN LEVEL (UNFRACTIONATED): Heparin Unfractionated: <0.1 IU/mL — ABNORMAL LOW (ref 0.30–0.70)

## 2013-08-31 NOTE — Progress Notes (Signed)
     Patient Name: Jacob [name] Date of Encounter: 08/31/2013  Active Problems:   Claudication    Patient Profile: 58 yo male w/ hx PVD, afib, tobacco use, HTN, HLD, was admitted 06/08 for PV cath, had PCI right EIA stent  SUBJECTIVE: No chest pain, no SOB overnight.  OBJECTIVE Filed Vitals:   08/30/13 2115 08/30/13 2345 08/31/13 0047 08/31/13 0634  BP: 128/76 92/60  97/55  Pulse:  102  91  Temp:  98.7 F (37.1 C)  97.5 F (36.4 C)  TempSrc:  Oral  Oral  Resp:  18  20  Height:      Weight:   225 lb 8.5 oz (102.3 kg)   SpO2:  96%  93%    Intake/Output Summary (Last 24 hours) at 08/31/13 0653 Last data filed at 08/31/13 0600  Gross per 24 hour  Intake 1482.32 ml  Output   1650 ml  Net -167.68 ml   Filed Weights   08/30/13 1040 08/31/13 0047  Weight: 225 lb (102.059 kg) 225 lb 8.5 oz (102.3 kg)    PHYSICAL EXAM General: Well developed, well nourished, male in no acute distress. Head: Normocephalic, atraumatic.  Neck: Supple without bruits, JVD not elevated. Lungs:  Resp regular and unlabored, CTA. Heart: Irregular, S1, S2, no S3, S4, or murmur; no rub. Abdomen: Soft, non-tender, non-distended, BS + x 4.  Extremities: No clubbing, cyanosis, no edema. Left fem cath site without ecchymosis, hematoma. Distal pulses weak but intact. Neuro: Alert and oriented X 3. Moves all extremities spontaneously. Psych: Normal affect.  LABS: CBC: Recent Labs  08/30/13 1128  WBC 7.0  HGB 8.3*  HCT 30.1*  MCV 70.3*  PLT 190   INR: Recent Labs  08/30/13 1128  INR 1.39   Basic Metabolic Panel:No results found for this basename: NA, K, CL, CO2, GLUCOSE, BUN, CREATININE, CALCIUM, MG, PHOS,  in the last 72 hours  TELE:    Atrial fib, slow VR at times, not sustained < 40, no pauses > 2 sec seen    Current Medications:  . carvedilol  25 mg Oral BID  . furosemide  40 mg Oral BID  . glipiZIDE  5 mg Oral BID AC  . lisinopril  2.5 mg Oral Daily  . spironolactone  25 mg Oral  Daily   . heparin 1,100 Units/hr (08/31/13 0600)    ASSESSMENT AND PLAN: Active Problems:   Claudication - s/p PCI right ext iliac, bilateral SFAs occluded. Med rx recommended per cath note.    HTN - BP lower than on admission. Pt states he is compliant w/ rx and takes about 9 or 10 pills/day. Continue current Rx     Anemia - Pt denies black/tarry stools. No eval for anemia. Ck guiac, can send pt home w/ cards. F/u as OP w/ [employer].     Atrial fib - on Coreg    Anticoag - Restart Xarelto    Tobacco - cessation encouraged    DM - encourage diet and Rx compliance  Signed, Darrol Jump , PA-C 6:53 AM 08/31/2013 Patient seen and examined and history reviewed. Agree with above findings and plan. Doing well this am. In AFib. No groin or leg pain. Cath site without hematoma. Will resume Xarelto today and DC home. Anemia is stable post procedure and will need to be worked up as an outpatient. Counseled on smoking cessation.  Jandi Swiger M [place], MDFACC 08/31/2013 8:36 AM

## 2013-08-31 NOTE — Discharge Instructions (Signed)
PLEASE REMEMBER TO BRING ALL OF YOUR MEDICATIONS TO EACH OF YOUR FOLLOW-UP OFFICE VISITS. ° °PLEASE ATTEND ALL SCHEDULED FOLLOW-UP APPOINTMENTS.  ° °Activity: Increase activity slowly as tolerated. You may shower, but no soaking baths (or swimming) for 1 week. No driving for 2 days. No lifting over 5 lbs for 1 week. No sexual activity for 1 week.  ° °You May Return to Work: in 1 week (if applicable) ° °Wound Care: You may wash cath site gently with soap and water. Keep cath site clean and dry. If you notice pain, swelling, bleeding or pus at your cath site, please call [phone]. ° ° ° °Cardiac Cath Site Care °Refer to this sheet in the next few weeks. These instructions provide you with information on caring for yourself after your procedure. Your caregiver may also give you more specific instructions. Your treatment has been planned according to current medical practices, but problems sometimes occur. Call your caregiver if you have any problems or questions after your procedure. °HOME CARE INSTRUCTIONS °· You may shower 24 hours after the procedure. Remove the bandage (dressing) and gently wash the site with plain soap and water. Gently pat the site dry.  °· Do not apply powder or lotion to the site.  °· Do not sit in a bathtub, swimming pool, or whirlpool for 5 to 7 days.  °· No bending, squatting, or lifting anything over 10 pounds (4.5 kg) as directed by your caregiver.  °· Inspect the site at least twice daily.  °· Do not drive home if you are discharged the same day of the procedure. Have someone else drive you.  °· You may drive 24 hours after the procedure unless otherwise instructed by your caregiver.  °What to expect: °· Any bruising will usually fade within 1 to 2 weeks.  °· Blood that collects in the tissue (hematoma) may be painful to the touch. It should usually decrease in size and tenderness within 1 to 2 weeks.  °SEEK IMMEDIATE MEDICAL CARE IF: °· You have unusual pain at the site or down the  affected limb.  °· You have redness, warmth, swelling, or pain at the site.  °· You have drainage (other than a small amount of blood on the dressing).  °· You have chills.  °· You have a fever or persistent symptoms for more than 72 hours.  °· You have a fever and your symptoms suddenly get worse.  °· Your leg becomes pale, cool, tingly, or numb.  °· You have heavy bleeding from the site. Hold pressure on the site.  °Document Released: 04/13/2010 Document Revised: 02/28/2011 Document Reviewed: 04/13/2010 °ExitCare® Patient Information ©2012 [employer]. ° °

## 2013-08-31 NOTE — Discharge Summary (Signed)
Patient seen and examined and history reviewed. Agree with above findings and plan. See earlier rounding note.   Peter M Jordan, MDFACC 08/31/2013 2:42 PM    

## 2013-08-31 NOTE — Progress Notes (Signed)
Pt eager to leave the hosp,claimed "cousin waiting for him infront of [employer] entrance" discharge paper reviewed with patient, post procedure care instructions given including activities and driving limitations/restrictions  & patient  Agreed.  Pt refused translator, pt able to speak and understand english.

## 2013-08-31 NOTE — Discharge Summary (Signed)
CARDIOLOGY DISCHARGE SUMMARY   Patient ID: Jacob [name] MRN: [id number] DOB/AGE: 30-Apr-1955 58 y.o.  Admit date: 08/30/2013 Discharge date: 08/31/2013  PCP: Jeanann LewandowskyJEGEDE, OLUGBEMIGA, MD Primary cardiologist: Dr. Elease HashimotoNahser Electrophysiology Cardiologist: Dr. Ladona Ridgelaylor Peripheral vascular: Dr. Allyson SabalBerry  Primary Discharge Diagnosis: Claudication - s/p 10 mm x 40 mm long Smart Nitinol self-expanding stent to the right external iliac artery  Secondary Discharge Diagnosis:  Hypertension Anemia Atrial fibrillation Chronic anticoagulation Diabetes  Procedures: Abdominal aortogram, lower extremity angiogram, right external iliac artery stent  Hospital Course: Jacob [name] is a 58 y.o. male with a history of CAD and recently diagnosed PVD. His ABIs were decreased and peripheral vascular catheterization was scheduled. He came to the hospital for the procedure on 06/08.  Catheterization results are below. He had a stent to his right external iliac artery. His SFAs were occluded bilaterally and medical therapy was recommended. His anemia was noted and it is worse than previously seen. He has no history of GI evaluation and denies any black or tarry stools. He denies blood in his stools. His hemoglobin and hematocrit were unchanged overnight. His MCV is low. He is encouraged to followup with primary care as soon as possible.  Since he has no overt signs of bleeding, he is to continue his Xarelto. On 06/09, he was seen by Dr. Swaziland[place] in all data were reviewed. His cath site was stable, and he was ambulating without chest pain or shortness of breath. No further inpatient workup is indicated and he is considered stable for discharge, to followup as an outpatient.  Labs:   Lab Results  Component Value Date   WBC 6.9 08/31/2013   HGB 8.4* 08/31/2013   HCT 30.9* 08/31/2013   MCV 69.4* 08/31/2013   PLT 206 08/31/2013     Recent Labs Lab 08/31/13 0730  NA 133*  K 4.9  CL 96  CO2 29  BUN 21  CREATININE 0.83    CALCIUM 8.6  GLUCOSE 116*    Recent Labs  08/30/13 1128  INR 1.39   PV Angiogram/Intervention 08/30/2013 Angiographic Data:  1: Abdominal aorta-renal arteries were widely patent. The infrarenal abdominal aorta was free of significant atherosclerotic changes.  2: Left lower extremity-the left SFA was occluded at its origin reconstituted in the adductor canal by profunda femoris collaterals. There was two-vessel runoff with an occluded anterior tibial  3: Right lower extremity-there was a 75% fairly focal eccentric right external iliac artery stenosis with a 30 mm pullback gradient, totally occluded right SFA with reconstitution in the adductor canal by profunda femoris collaterals. There was two-vessel runoff with an occluded anterior tibial  IMPRESSION:Jacob [name] had bilateral SFA occlusion most suited for surgical revascularization. He is two-vessel runoff bilaterally. He does have a high-grade right external iliac artery stenosis which will need to be stented. He is on Xarelto anticoagulation because of chronic A. Fib atrial fibrillation with iron deficiency anemia and a hemoglobin of 8.3 which will need to be worked up as an outpatient. Final Impression: successful right external iliac artery stenting with totally occluded SFAs bilaterally probably best treated medically at this point because of his comorbidities and severe cardiac issues. The sheath will be removed once the ACT falls below 170 pressure will be held. He'll be gently hydrated overnight and discharge him in the morning. Because of his anemia and the fact that he is on an oral anticoagulant I am not going to start antiplatelet agent. I will start IV hep 6 hours after sheath removal  with plans to restart Xarelto tomorrow. Will need OP GI eval for Fe deficiency anemia   FOLLOW UP PLANS AND APPOINTMENTS No Known Allergies   Medication List         carvedilol 25 MG tablet  Commonly known as:  COREG  Take 1 tablet (25 mg total)  by mouth 2 (two) times daily.     furosemide 40 MG tablet  Commonly known as:  LASIX  Take 1 tablet (40 mg total) by mouth 2 (two) times daily.     glipiZIDE 5 MG tablet  Commonly known as:  GLUCOTROL  Take 1 tablet (5 mg total) by mouth 2 (two) times daily before a meal.     lisinopril 2.5 MG tablet  Commonly known as:  ZESTRIL  Take 1 tablet (2.5 mg total) by mouth daily.     rivaroxaban 20 MG Tabs tablet  Commonly known as:  XARELTO  Take 1 tablet (20 mg total) by mouth daily before supper.     spironolactone 25 MG tablet  Commonly known as:  ALDACTONE  Take 1 tablet (25 mg total) by mouth daily.        Discharge Instructions   Diet - low sodium heart healthy    Complete by:  As directed      Diet Carb Modified    Complete by:  As directed      Increase activity slowly    Complete by:  As directed           Follow-up Information   Follow up with [facility]. (Dopplers/ABIs at 11:00 AM)    Contact information:   [address] [place] [address] [phone]      Follow up with Runell Gess, MD On 09/21/2013. (At 1:45 PM)    Specialty:  Cardiology   Contact information:   [address] [place] [address] [phone]       BRING ALL MEDICATIONS WITH YOU TO FOLLOW UP APPOINTMENTS  Time spent with patient to include physician time: 41 min Signed: Darrol Jump, PA-C 08/31/2013, 10:02 AM Co-Sign MD

## 2013-09-14 ENCOUNTER — Inpatient Hospital Stay (HOSPITAL_COMMUNITY): Admit: 2013-09-14 | Payer: Medicaid - Out of State

## 2013-09-15 ENCOUNTER — Ambulatory Visit (HOSPITAL_COMMUNITY)
Admission: RE | Admit: 2013-09-15 | Discharge: 2013-09-15 | Disposition: A | Discharge status: Home / Self Care | Payer: Medicaid Other | Source: Ambulatory Visit | Attending: Cardiology | Admitting: Cardiology

## 2013-09-15 DIAGNOSIS — I70219 Atherosclerosis of native arteries of extremities with intermittent claudication, unspecified extremity: Secondary | ICD-10-CM

## 2013-09-15 DIAGNOSIS — I739 Peripheral vascular disease, unspecified: Secondary | ICD-10-CM | POA: Diagnosis not present

## 2013-09-15 NOTE — Progress Notes (Signed)
Right Lower Ext Arterial Limited completed post intervention.  Jacob [name], BS, RDMS, RVT

## 2013-09-20 ENCOUNTER — Telehealth: Payer: Self-pay | Admitting: *Deleted

## 2013-09-20 ENCOUNTER — Encounter: Payer: Self-pay | Admitting: *Deleted

## 2013-09-20 ENCOUNTER — Ambulatory Visit: Payer: Medicaid - Out of State | Admitting: Cardiovascular Disease

## 2013-09-20 DIAGNOSIS — I739 Peripheral vascular disease, unspecified: Secondary | ICD-10-CM

## 2013-09-20 NOTE — Telephone Encounter (Signed)
Order placed for repeat lower extremity arterial doppler in 1 year  

## 2013-09-20 NOTE — Telephone Encounter (Signed)
Message copied by Marella Bile on Mon Sep 20, 2013  5:21 PM ------      Message from: Runell Gess      Created: Sat Sep 18, 2013  3:40 PM       No change from prior study. Repeat in 12 months. ------

## 2013-09-21 ENCOUNTER — Ambulatory Visit: Payer: Medicaid - Out of State | Admitting: Cardiovascular Disease

## 2013-09-28 ENCOUNTER — Ambulatory Visit: Payer: Medicaid - Out of State | Admitting: Cardiology

## 2013-09-29 ENCOUNTER — Telehealth: Payer: Self-pay | Admitting: Cardiovascular Disease

## 2013-09-30 NOTE — Telephone Encounter (Signed)
Closed encounter °

## 2013-10-15 ENCOUNTER — Telehealth: Payer: Self-pay | Admitting: Cardiovascular Disease

## 2013-10-15 NOTE — Telephone Encounter (Signed)
Closed encounter °

## 2013-10-25 ENCOUNTER — Encounter: Payer: Medicaid Other | Admitting: *Deleted

## 2013-10-28 ENCOUNTER — Encounter: Payer: Self-pay | Admitting: Cardiology

## 2013-10-29 ENCOUNTER — Telehealth: Payer: Self-pay | Admitting: *Deleted

## 2013-10-29 ENCOUNTER — Ambulatory Visit (INDEPENDENT_AMBULATORY_CARE_PROVIDER_SITE_OTHER): Payer: Medicaid Other | Admitting: Cardiology

## 2013-10-29 ENCOUNTER — Encounter: Payer: Self-pay | Admitting: Cardiology

## 2013-10-29 VITALS — BP 125/76 | HR 87 | Ht 66.0 in | Wt 229.5 lb

## 2013-10-29 DIAGNOSIS — Z720 Tobacco use: Secondary | ICD-10-CM

## 2013-10-29 DIAGNOSIS — I10 Essential (primary) hypertension: Secondary | ICD-10-CM

## 2013-10-29 DIAGNOSIS — I480 Paroxysmal atrial fibrillation: Secondary | ICD-10-CM

## 2013-10-29 DIAGNOSIS — Z95810 Presence of automatic (implantable) cardiac defibrillator: Secondary | ICD-10-CM

## 2013-10-29 DIAGNOSIS — Z9119 Patient's noncompliance with other medical treatment and regimen: Secondary | ICD-10-CM

## 2013-10-29 DIAGNOSIS — E119 Type 2 diabetes mellitus without complications: Secondary | ICD-10-CM

## 2013-10-29 DIAGNOSIS — I739 Peripheral vascular disease, unspecified: Secondary | ICD-10-CM

## 2013-10-29 DIAGNOSIS — I482 Chronic atrial fibrillation, unspecified: Secondary | ICD-10-CM

## 2013-10-29 DIAGNOSIS — I4891 Unspecified atrial fibrillation: Secondary | ICD-10-CM

## 2013-10-29 DIAGNOSIS — Z91199 Patient's noncompliance with other medical treatment and regimen due to unspecified reason: Secondary | ICD-10-CM

## 2013-10-29 DIAGNOSIS — F17200 Nicotine dependence, unspecified, uncomplicated: Secondary | ICD-10-CM

## 2013-10-29 NOTE — Assessment & Plan Note (Signed)
Pt was brought to the ED by ambulance because she was homeless and seeking resources. She arrived with a medium sized suitcase on wheels. Shelter resources were provided. Pt was transported to the [facility]. Pt denied the need for other resources including clothes.

## 2013-10-29 NOTE — Assessment & Plan Note (Signed)
No angina 

## 2013-10-29 NOTE — Telephone Encounter (Signed)
Wynona Canes called to report Mr Calderon as being noncompliant with his medication.  Wynona Canes sees him weekly and his bottles have been full.  The pharmacy relays that Mr Macer has not been getting the refills. Mr Gruba has an appt with Franky Macho this afternoon.  I will relay this info to [facility].

## 2013-10-29 NOTE — Patient Instructions (Signed)
Your physician recommends that you schedule a follow-up appointment in 2 weeks with Dr Allyson Sabal.

## 2013-10-29 NOTE — Progress Notes (Signed)
10/29/2013 Jacob [name]   29-Oct-1955  [id number]  Primary Physicia [name], MD Primary Cardiologist: Dr Ladona Ridgel Dr Allyson Sabal  HPI:  Jacob [name] is a 58 year old moderately overweight, homeless [place] male, referred to Dr Allyson Sabal for evaluation of claudication. He is a history of CAD status post CABG x2 at [facility] April 2013. He has severe ischemic cardiomyopathy (EF 20-25% 04/06/13) and had an ICD implantation in [place] in 2013. He is followed by Dr. Ladona Ridgel. Other problems include 100-pack-year history of tobacco abuse, currently trying to cut back at least one pack per day, hypertension, hyperlipidemia and diabetes. He does have chronic A. Fib on oral anticoagulation. He is homeless but is currently in a program where he has been provided a hotel room.           He had claudication and had a REIA PTA 08/30/13. He has know bilateral SFA occlusion that Dr Allyson Sabal had hoped to treat ,medically as he felt he was a high risk for surgery. Post PTA dopplers done 09/15/13 showed ABIs of 0.66bilaterally with bilateral tibial occlusion. He is seen in the office today complaining of claudication in both legs. He say almost any walking brings on pain in his legs. He asked for a handicap sticker. He says he has been compliant with his medication but he is still smoking some.     Current Outpatient Prescriptions  Medication Sig Dispense Refill  . carvedilol (COREG) 25 MG tablet Take 1 tablet (25 mg total) by mouth 2 (two) times daily.  60 tablet  6  . furosemide (LASIX) 40 MG tablet Take 1 tablet (40 mg total) by mouth 2 (two) times daily.  30 tablet  6  . glipiZIDE (GLUCOTROL) 5 MG tablet Take 1 tablet (5 mg total) by mouth 2 (two) times daily before a meal.  60 tablet  6  . lisinopril (ZESTRIL) 2.5 MG tablet Take 1 tablet (2.5 mg total) by mouth daily.  30 tablet  6  . Rivaroxaban (XARELTO) 20 MG TABS tablet Take 1 tablet (20 mg total) by mouth daily before supper.  30 tablet  6  . spironolactone  (ALDACTONE) 25 MG tablet Take 1 tablet (25 mg total) by mouth daily.  30 tablet  6   No current facility-administered medications for this visit.    No Known Allergies  History   Social History  . Marital Status: Divorced    Spouse Name: N/A    Number of Children: 3  . Years of Education: N/A   Occupational History  . Unemployed    Social History Main Topics  . Smoking status: Current Every Day Smoker -- 0.25 packs/day for 38 years    Types: Cigarettes  . Smokeless tobacco: Never Used  . Alcohol Use: No  . Drug Use: No  . Sexual Activity: Yes   Other Topics Concern  . Not on file   Social History Narrative   Has an apartment with a roommate. He was living on the streets in 02-07-2013.  He reports that his father died in [place] in Feb 07, 2013.  He is divorced.  He is no longer estranged from his son, but still from his daughter who lives locally.  Neither of his parents, nor any siblings have any history of CAD.     Review of Systems: General: negative for chills, fever, night sweats or weight changes.  Cardiovascular: negative for chest pain, dyspnea on exertion, edema, orthopnea, palpitations, paroxysmal nocturnal dyspnea or shortness of breath Dermatological: negative  for rash Respiratory: negative for cough or wheezing Urologic: negative for hematuria Abdominal: negative for nausea, vomiting, diarrhea, bright red blood per rectum, melena, or hematemesis Neurologic: negative for visual changes, syncope, or dizziness All other systems reviewed and are otherwise negative except as noted above.    Blood pressure 125/76, pulse 87, height 5\' 6"  (1.676 m), weight 229 lb 8 oz (104.101 kg).  General appearance: alert, cooperative, no distress and moderately obese Neck: no carotid bruit and no JVD Lungs: clear to auscultation bilaterally Heart: irregularly irregular rhythm Abdomen: obese Extremities: no edema Pulses: diminnished pulses bilat, no FA bruit noted Skin:  Skin color, texture, turgor normal. No rashes or lesions Neurologic: Grossly normal  EKG AF PVC  ASSESSMENT AND PLAN:   Claudication Pt in the office today complaining of lifestyle limiting bilateral lower extremity claudication.  PVD (peripheral vascular disease) REIA stent 08/30/13 with known bilateral SFA occlusion. ABIs 0.66 bilaterally after his RCIA stent.  CAD- s/p CABG July 2014 [facility] No angina  AF (atrial fibrillation) This appears to be chronic, he is in AF today in the office  ICD - in place- BS May 2014 [place] Followed by Dr Ladona Ridgelaylor  Noncompliance Homeless  Type 2 diabetes mellitus not at goal .  HYPERTENSION Controlled  Tobacco abuse Continued smoking    PLAN  I will review with Dr Allyson SabalBerry- ? Surgical referral, ? Is a PTA a possibility   Eugenie Harewood KPA-C 10/29/2013 4:57 PM

## 2013-10-29 NOTE — Assessment & Plan Note (Signed)
Pt in the office today complaining of lifestyle limiting bilateral lower extremity claudication.

## 2013-10-29 NOTE — Assessment & Plan Note (Signed)
Controlled.  

## 2013-10-29 NOTE — Assessment & Plan Note (Signed)
This appears to be chronic, he is in AF today in the office

## 2013-10-29 NOTE — Assessment & Plan Note (Signed)
Continued smoking

## 2013-10-29 NOTE — Assessment & Plan Note (Addendum)
REIA stent 08/30/13 with known bilateral SFA occlusion. ABIs 0.66 bilaterally after his RCIA stent.

## 2013-10-29 NOTE — Assessment & Plan Note (Signed)
Followed by Dr. Taylor 

## 2013-11-15 ENCOUNTER — Other Ambulatory Visit: Payer: Self-pay | Admitting: Internal Medicine

## 2013-11-15 ENCOUNTER — Encounter: Payer: Self-pay | Admitting: Cardiovascular Disease

## 2013-11-15 ENCOUNTER — Ambulatory Visit (INDEPENDENT_AMBULATORY_CARE_PROVIDER_SITE_OTHER): Payer: Medicaid Other | Admitting: Cardiovascular Disease

## 2013-11-15 VITALS — BP 102/70 | HR 84 | Ht 66.0 in | Wt 238.0 lb

## 2013-11-15 DIAGNOSIS — Z79899 Other long term (current) drug therapy: Secondary | ICD-10-CM

## 2013-11-15 DIAGNOSIS — D689 Coagulation defect, unspecified: Secondary | ICD-10-CM

## 2013-11-15 DIAGNOSIS — I739 Peripheral vascular disease, unspecified: Secondary | ICD-10-CM

## 2013-11-15 NOTE — Progress Notes (Signed)
11/15/2013 Jacob [name]   [birth year]  [id number]  Primary Physician Jacob Lewandowsky, MD Primary Cardiologist: Jacob Gess MD Jacob [name]   HPI:  Jacob [name] is a 58 year old moderately overweight [place] male father of 3 children referred by Jacob [name] for evaluation of claudication. He is a history of coronary artery disease status post bypass grafting x2 at [facility] April 2013. 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 Ladona [name]. The problems include 100-pack-year history of tobacco abuse currently trying to stop smoking at least one pack per day, hypertension, hyperlipidemia and diabetes. He does have chronic A. Fib on oral anticoagulation. He complains of occasional chest pain as well as dyspnea. Over the last year has developed right greater than left lungs" at less than 50 feet with Dopplers that showed ABIs of 0.6 range biphasic popliteal waveforms. I intubated him on 08/30/13 revealed a high-grade right external iliac artery stenosis which was stented. His residual occluded SFAs bilaterally and continues to have lifestyle limiting claudication. He also continues to smoke. I believe he would be high risk for femoral popliteal bypass grafting.    Current Outpatient Prescriptions  Medication Sig Dispense Refill  . carvedilol (COREG) 25 MG tablet Take 1 tablet (25 mg total) by mouth 2 (two) times daily.  60 tablet  6  . furosemide (LASIX) 40 MG tablet Take 1 tablet (40 mg total) by mouth 2 (two) times daily.  30 tablet  6  . glipiZIDE (GLUCOTROL) 5 MG tablet Take 1 tablet (5 mg total) by mouth 2 (two) times daily before a meal.  60 tablet  6  . lisinopril (ZESTRIL) 2.5 MG tablet Take 1 tablet (2.5 mg total) by mouth daily.  30 tablet  6  . Rivaroxaban (XARELTO) 20 MG TABS tablet Take 1 tablet (20 mg total) by mouth daily before supper.  30 tablet  6  . spironolactone (ALDACTONE) 25 MG tablet Take 1 tablet (25  mg total) by mouth daily.  30 tablet  6   No current facility-administered medications for this visit.    No Known Allergies  History   Social History  . Marital Status: Divorced    Spouse Name: N/A    Number of Children: 3  . Years of Education: N/A   Occupational History  . Unemployed    Social History Main Topics  . Smoking status: Current Every Day Smoker -- 0.25 packs/day for 38 years    Types: Cigarettes  . Smokeless tobacco: Never Used  . Alcohol Use: No  . Drug Use: No  . Sexual Activity: Yes   Other Topics Concern  . Not on file   Social History Narrative   Has an apartment with a roommate. He was living on the streets in 02-07-2013.  He reports that his father died in [place] in 02-07-2013.  He is divorced.  He is no longer estranged from his son, but still from his daughter who lives locally.  Neither of his parents, nor any siblings have any history of CAD.     Review of Systems: General: negative for chills, fever, night sweats or weight changes.  Cardiovascular: negative for chest pain, dyspnea on exertion, edema, orthopnea, palpitations, paroxysmal nocturnal dyspnea or shortness of breath Dermatological: negative for rash Respiratory: negative for cough or wheezing Urologic: negative for hematuria Abdominal: negative for nausea, vomiting, diarrhea, bright red blood per rectum, melena, or hematemesis Neurologic: negative for visual changes, syncope,  or dizziness All other systems reviewed and are otherwise negative except as noted above.    Blood pressure 102/70, pulse 84, height 5\' 6"  (1.676 m), weight 238 lb (107.956 kg).  General appearance: alert and no distress Neck: no adenopathy, supple, symmetrical, trachea midline, thyroid not enlarged, symmetric, no tenderness/mass/nodules and soft bilateral carotid bruits Lungs: clear to auscultation bilaterally Heart: regular rate and rhythm, S1, S2 normal, no murmur, click, rub or gallop Extremities:  extremities normal, atraumatic, no cyanosis or edema  EKG not performed today  ASSESSMENT AND PLAN:   PVD (peripheral vascular disease) Jacob [name] has PAD with lifestyle limiting claudication. I stented his right external iliac artery 08/30/13. He still significantly symptomatic with ABIs and 8.6 range bilaterally. He has occluded SFAs possibly with reconstitution in the adductor canal. He has ischemic myopathy and I do not think he is an appropriate candidate for femoropopliteal bypass grafting. I have agreed to attempt percutaneous revascularization.      Jacob Gess MD FACP,FACC,FAHA, Jacob [name] 11/15/2013 4:33 PM

## 2013-11-15 NOTE — Patient Instructions (Addendum)
Dr. Allyson Sabal has ordered a peripheral angiogram to be done at [facility].  This procedure is going to look at the bloodflow in your lower extremities.  If Dr. Allyson Sabal is able to open up the arteries, you will have to spend one night in the hospital.  If he is not able to open the arteries, you will be able to go home that same day.    After the procedure, you will not be allowed to drive for 3 days or push, pull, or lift anything greater than 10 lbs for one week.    You will be required to have the following tests prior to the procedure:  1. Blood work-the blood work can be done no more than 7 days prior to the procedure.  It can be done at any [facility] lab.  There is one downstairs on the first floor of this building and one in the [facility] ([address])   Left groin *REPS [employer] your coumadin 4 days prior to the procedure. (procedure is day 5 of no coumadin)  Carotid Duplex- This test is an ultrasound of the carotid arteries in your neck. It looks at blood flow through these arteries that supply the brain with blood. Allow one hour for this exam. There are no restrictions or special instructions.

## 2013-11-15 NOTE — Assessment & Plan Note (Signed)
Mr. Rhatigan has PAD with lifestyle limiting claudication. I stented his right external iliac artery 08/30/13. He still significantly symptomatic with ABIs and 8.6 range bilaterally. He has occluded SFAs possibly with reconstitution in the adductor canal. He has ischemic myopathy and I do not think he is an appropriate candidate for femoropopliteal bypass grafting. I have agreed to attempt percutaneous revascularization.

## 2013-11-16 ENCOUNTER — Encounter: Payer: Self-pay | Admitting: Cardiovascular Disease

## 2013-11-19 ENCOUNTER — Ambulatory Visit (HOSPITAL_COMMUNITY)
Admission: RE | Admit: 2013-11-19 | Discharge: 2013-11-19 | Disposition: A | Discharge status: Home / Self Care | Payer: Medicaid Other | Source: Ambulatory Visit | Attending: Cardiology | Admitting: Cardiology

## 2013-11-19 DIAGNOSIS — I6529 Occlusion and stenosis of unspecified carotid artery: Secondary | ICD-10-CM | POA: Diagnosis not present

## 2013-11-19 DIAGNOSIS — I739 Peripheral vascular disease, unspecified: Secondary | ICD-10-CM

## 2013-11-19 DIAGNOSIS — I658 Occlusion and stenosis of other precerebral arteries: Secondary | ICD-10-CM | POA: Diagnosis not present

## 2013-11-19 DIAGNOSIS — R0989 Other specified symptoms and signs involving the circulatory and respiratory systems: Secondary | ICD-10-CM

## 2013-11-19 NOTE — Progress Notes (Signed)
Carotid Duplex Completed. Jacob [name], BS, RDMS, RVT  

## 2013-11-24 ENCOUNTER — Ambulatory Visit: Payer: Self-pay | Admitting: Cardiovascular Disease

## 2013-11-25 ENCOUNTER — Telehealth: Payer: Self-pay | Admitting: Cardiovascular Disease

## 2013-11-25 NOTE — Telephone Encounter (Signed)
I spoke with the congregation nurse concerning Jacob [name].  He is complaining of some chest pain.  He is not currently having it, but had some earlier that lasted approximately 20 minutes. He feels kind-of tired today.  She also reports that his weight is up approximately 4 pounds since the 8/24.  He is not short of breath and doesn't report any shortness of breath.  She doesn't have her stethoscope with her so she is not able to record his blood pressure or listen to his lungs.  I reviewed the office note from Dr Allyson Sabal from 8/24.  Patient commented to Dr Allyson Sabal that he had had some episodes of chest pain and Dr Allyson Sabal did not change his treatment based on those complaints.  I advised patient to go to ER if symptoms returned and to proceed with scheduled pv angio next week.  [employer] nurse will go out to Jacob [name]'s house tomorrow with her stethoscope and call us with any changes.

## 2013-11-30 ENCOUNTER — Encounter (HOSPITAL_COMMUNITY): Payer: Self-pay | Admitting: Pharmacy Technician

## 2013-12-02 ENCOUNTER — Encounter (HOSPITAL_COMMUNITY)
Admission: RE | Disposition: A | Discharge status: Home / Self Care | Payer: Self-pay | Source: Ambulatory Visit | Attending: Cardiovascular Disease

## 2013-12-02 ENCOUNTER — Ambulatory Visit (HOSPITAL_COMMUNITY)
Admission: RE | Admit: 2013-12-02 | Discharge: 2013-12-02 | Disposition: A | Discharge status: Home / Self Care | Payer: Medicaid Other | Source: Ambulatory Visit | Attending: Cardiovascular Disease | Admitting: Cardiovascular Disease

## 2013-12-02 DIAGNOSIS — E119 Type 2 diabetes mellitus without complications: Secondary | ICD-10-CM | POA: Diagnosis not present

## 2013-12-02 DIAGNOSIS — I4891 Unspecified atrial fibrillation: Secondary | ICD-10-CM | POA: Insufficient documentation

## 2013-12-02 DIAGNOSIS — I2510 Atherosclerotic heart disease of native coronary artery without angina pectoris: Secondary | ICD-10-CM | POA: Insufficient documentation

## 2013-12-02 DIAGNOSIS — Z951 Presence of aortocoronary bypass graft: Secondary | ICD-10-CM | POA: Diagnosis not present

## 2013-12-02 DIAGNOSIS — I10 Essential (primary) hypertension: Secondary | ICD-10-CM | POA: Diagnosis not present

## 2013-12-02 DIAGNOSIS — R0609 Other forms of dyspnea: Secondary | ICD-10-CM | POA: Diagnosis not present

## 2013-12-02 DIAGNOSIS — I70219 Atherosclerosis of native arteries of extremities with intermittent claudication, unspecified extremity: Secondary | ICD-10-CM

## 2013-12-02 DIAGNOSIS — Z6836 Body mass index (BMI) 36.0-36.9, adult: Secondary | ICD-10-CM | POA: Insufficient documentation

## 2013-12-02 DIAGNOSIS — I739 Peripheral vascular disease, unspecified: Secondary | ICD-10-CM

## 2013-12-02 DIAGNOSIS — R079 Chest pain, unspecified: Secondary | ICD-10-CM | POA: Insufficient documentation

## 2013-12-02 DIAGNOSIS — R0989 Other specified symptoms and signs involving the circulatory and respiratory systems: Secondary | ICD-10-CM | POA: Insufficient documentation

## 2013-12-02 DIAGNOSIS — Z95810 Presence of automatic (implantable) cardiac defibrillator: Secondary | ICD-10-CM | POA: Diagnosis not present

## 2013-12-02 DIAGNOSIS — D689 Coagulation defect, unspecified: Secondary | ICD-10-CM

## 2013-12-02 DIAGNOSIS — E785 Hyperlipidemia, unspecified: Secondary | ICD-10-CM | POA: Diagnosis not present

## 2013-12-02 DIAGNOSIS — E663 Overweight: Secondary | ICD-10-CM | POA: Insufficient documentation

## 2013-12-02 DIAGNOSIS — Z79899 Other long term (current) drug therapy: Secondary | ICD-10-CM

## 2013-12-02 DIAGNOSIS — I2589 Other forms of chronic ischemic heart disease: Secondary | ICD-10-CM | POA: Diagnosis not present

## 2013-12-02 DIAGNOSIS — Z7901 Long term (current) use of anticoagulants: Secondary | ICD-10-CM | POA: Insufficient documentation

## 2013-12-02 DIAGNOSIS — F17200 Nicotine dependence, unspecified, uncomplicated: Secondary | ICD-10-CM | POA: Insufficient documentation

## 2013-12-02 HISTORY — PX: LOWER EXTREMITY ANGIOGRAM: SHX5508

## 2013-12-02 LAB — GLUCOSE, CAPILLARY
GLUCOSE-CAPILLARY: 272 mg/dL — AB (ref 70–99)
GLUCOSE-CAPILLARY: 279 mg/dL — AB (ref 70–99)
Glucose-Capillary: 322 mg/dL — ABNORMAL HIGH (ref 70–99)

## 2013-12-02 LAB — BASIC METABOLIC PANEL
Anion gap: 13 (ref 5–15)
BUN: 20 mg/dL (ref 6–23)
CALCIUM: 8.8 mg/dL (ref 8.4–10.5)
CO2: 24 meq/L (ref 19–32)
Chloride: 97 mEq/L (ref 96–112)
Creatinine, Ser: 0.74 mg/dL (ref 0.50–1.35)
GFR calc Af Amer: >90 mL/min (ref >90)
GFR calc non Af Amer: >90 mL/min (ref >90)
GLUCOSE: 319 mg/dL — AB (ref 70–99)
Potassium: 4.3 mEq/L (ref 3.7–5.3)
SODIUM: 134 meq/L — AB (ref 137–147)

## 2013-12-02 LAB — CBC
HCT: 35.6 % — ABNORMAL LOW (ref 39.0–52.0)
Hemoglobin: 10.5 g/dL — ABNORMAL LOW (ref 13.0–17.0)
MCH: 19.9 pg — AB (ref 26.0–34.0)
MCHC: 29.5 g/dL — ABNORMAL LOW (ref 30.0–36.0)
MCV: 67.6 fL — AB (ref 78.0–100.0)
Platelets: 229 10*3/uL (ref 150–400)
RBC: 5.27 MIL/uL (ref 4.22–5.81)
RDW: 20.7 % — ABNORMAL HIGH (ref 11.5–15.5)
WBC: 8.2 10*3/uL (ref 4.0–10.5)

## 2013-12-02 LAB — PROTIME-INR
INR: 1.2 (ref 0.00–1.49)
PROTHROMBIN TIME: 15.2 s (ref 11.6–15.2)

## 2013-12-02 SURGERY — ANGIOGRAM, LOWER EXTREMITY
Anesthesia: LOCAL

## 2013-12-02 MED ORDER — FENTANYL CITRATE 0.05 MG/ML IJ SOLN
INTRAMUSCULAR | Status: AC
  Filled 2013-12-02: qty 2

## 2013-12-02 MED ORDER — MIDAZOLAM HCL 2 MG/2ML IJ SOLN
INTRAMUSCULAR | Status: AC
  Filled 2013-12-02: qty 2

## 2013-12-02 MED ORDER — LIDOCAINE HCL (PF) 1 % IJ SOLN
INTRAMUSCULAR | Status: AC
  Filled 2013-12-02: qty 30

## 2013-12-02 MED ORDER — SODIUM CHLORIDE 0.9 % IJ SOLN: 3.0000 mL | INTRAMUSCULAR | Status: DC | PRN

## 2013-12-02 MED ORDER — ACETAMINOPHEN 325 MG PO TABS
650.0000 mg | ORAL_TABLET | ORAL | Status: DC | PRN
Start: 2013-12-02 — End: 2013-12-02

## 2013-12-02 MED ORDER — ASPIRIN 81 MG PO CHEW
CHEWABLE_TABLET | ORAL | Status: AC
  Filled 2013-12-02: qty 1

## 2013-12-02 MED ORDER — INSULIN ASPART 100 UNIT/ML ~~LOC~~ SOLN: 0.0000 [IU] | Freq: Three times a day (TID) | SUBCUTANEOUS | Status: DC

## 2013-12-02 MED ORDER — ASPIRIN 81 MG PO CHEW
81.0000 mg | CHEWABLE_TABLET | ORAL | Status: AC
  Administered 2013-12-02: 81 mg via ORAL

## 2013-12-02 MED ORDER — SODIUM CHLORIDE 0.9 % IV SOLN: INTRAVENOUS | Status: AC

## 2013-12-02 MED ORDER — SODIUM CHLORIDE 0.9 % IV SOLN
INTRAVENOUS | Status: DC
  Administered 2013-12-02: 1000 mL via INTRAVENOUS

## 2013-12-02 MED ORDER — MORPHINE SULFATE 2 MG/ML IJ SOLN: 1.0000 mg | INTRAMUSCULAR | Status: DC | PRN

## 2013-12-02 MED ORDER — ONDANSETRON HCL 4 MG/2ML IJ SOLN: 4.0000 mg | Freq: Four times a day (QID) | INTRAMUSCULAR | Status: DC | PRN

## 2013-12-02 MED ORDER — INSULIN ASPART 100 UNIT/ML ~~LOC~~ SOLN
0.0000 [IU] | Freq: Every day | SUBCUTANEOUS | Status: DC
  Administered 2013-12-02: 4 [IU] via SUBCUTANEOUS

## 2013-12-02 MED ORDER — HEPARIN (PORCINE) IN NACL 2-0.9 UNIT/ML-% IJ SOLN
INTRAMUSCULAR | Status: AC
  Filled 2013-12-02: qty 1000

## 2013-12-02 NOTE — H&P (View-Only) (Signed)
11/15/2013 Jacob [name]   [birth year]  [id number]  Primary Physician [name], MD Primary Cardiologist: Runell Gess MD [name]   HPI:  Jacob [name] is a 58 year old moderately overweight [place] male father of 3 children referred by Dr. Sharrell Ku for evaluation of claudication. He is a history of coronary artery disease status post bypass grafting x2 at [facility] April 2013. 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 Ladona Ridgel. The problems include 100-pack-year history of tobacco abuse currently trying to stop smoking at least one pack per day, hypertension, hyperlipidemia and diabetes. He does have chronic A. Fib on oral anticoagulation. He complains of occasional chest pain as well as dyspnea. Over the last year has developed right greater than left lungs" at less than 50 feet with Dopplers that showed ABIs of 0.6 range biphasic popliteal waveforms. I intubated him on 08/30/13 revealed a high-grade right external iliac artery stenosis which was stented. His residual occluded SFAs bilaterally and continues to have lifestyle limiting claudication. He also continues to smoke. I believe he would be high risk for femoral popliteal bypass grafting.    Current Outpatient Prescriptions  Medication Sig Dispense Refill  . carvedilol (COREG) 25 MG tablet Take 1 tablet (25 mg total) by mouth 2 (two) times daily.  60 tablet  6  . furosemide (LASIX) 40 MG tablet Take 1 tablet (40 mg total) by mouth 2 (two) times daily.  30 tablet  6  . glipiZIDE (GLUCOTROL) 5 MG tablet Take 1 tablet (5 mg total) by mouth 2 (two) times daily before a meal.  60 tablet  6  . lisinopril (ZESTRIL) 2.5 MG tablet Take 1 tablet (2.5 mg total) by mouth daily.  30 tablet  6  . Rivaroxaban (XARELTO) 20 MG TABS tablet Take 1 tablet (20 mg total) by mouth daily before supper.  30 tablet  6  . spironolactone (ALDACTONE) 25 MG tablet Take 1 tablet (25  mg total) by mouth daily.  30 tablet  6   No current facility-administered medications for this visit.    No Known Allergies  History   Social History  . Marital Status: Divorced    Spouse Name: N/A    Number of Children: 3  . Years of Education: N/A   Occupational History  . Unemployed    Social History Main Topics  . Smoking status: Current Every Day Smoker -- 0.25 packs/day for 38 years    Types: Cigarettes  . Smokeless tobacco: Never Used  . Alcohol Use: No  . Drug Use: No  . Sexual Activity: Yes   Other Topics Concern  . Not on file   Social History Narrative   Has an apartment with a roommate. He was living on the streets in Jan 25, 2013.  He reports that his father died in [place] in January 25, 2013.  He is divorced.  He is no longer estranged from his son, but still from his daughter who lives locally.  Neither of his parents, nor any siblings have any history of CAD.     Review of Systems: General: negative for chills, fever, night sweats or weight changes.  Cardiovascular: negative for chest pain, dyspnea on exertion, edema, orthopnea, palpitations, paroxysmal nocturnal dyspnea or shortness of breath Dermatological: negative for rash Respiratory: negative for cough or wheezing Urologic: negative for hematuria Abdominal: negative for nausea, vomiting, diarrhea, bright red blood per rectum, melena, or hematemesis Neurologic: negative for visual changes, syncope,  or dizziness All other systems reviewed and are otherwise negative except as noted above.    Blood pressure 102/70, pulse 84, height 5\' 6"  (1.676 m), weight 238 lb (107.956 kg).  General appearance: alert and no distress Neck: no adenopathy, supple, symmetrical, trachea midline, thyroid not enlarged, symmetric, no tenderness/mass/nodules and soft bilateral carotid bruits Lungs: clear to auscultation bilaterally Heart: regular rate and rhythm, S1, S2 normal, no murmur, click, rub or gallop Extremities:  extremities normal, atraumatic, no cyanosis or edema  EKG not performed today  ASSESSMENT AND PLAN:   PVD (peripheral vascular disease) Jacob [name] has PAD with lifestyle limiting claudication. I stented his right external iliac artery 08/30/13. He still significantly symptomatic with ABIs and 8.6 range bilaterally. He has occluded SFAs possibly with reconstitution in the adductor canal. He has ischemic myopathy and I do not think he is an appropriate candidate for femoropopliteal bypass grafting. I have agreed to attempt percutaneous revascularization.      Runell Gess MD FACP,FACC,FAHA, [facility] 11/15/2013 4:33 PM

## 2013-12-02 NOTE — Interval H&P Note (Signed)
History and Physical Interval Note:  12/02/2013 7:40 AM  Jacob [name]  has presented today for surgery, with the diagnosis of pad  The various methods of treatment have been discussed with the patient and family. After consideration of risks, benefits and other options for treatment, the patient has consented to  Procedure(s): LOWER EXTREMITY ANGIOGRAM (N/A) as a surgical intervention .  The patient's history has been reviewed, patient examined, no change in status, stable for surgery.  I have reviewed the patient's chart and labs.  Questions were answered to the patient's satisfaction.     Runell Gess

## 2013-12-02 NOTE — Progress Notes (Signed)
Site area:  Right  Groin a 5FR arterial sheath removed   Site Prior to Removal:  Level 0  Pressure Applied For 20 MINUTES    Minutes Beginning at  0815am  Manual:   Yes.    Patient Status During Pull:  stable  Post Pull Groin Site:  Level 0  Post Pull Instructions Given:  Yes.    Post Pull Pulses Present:  Yes.    Dressing Applied:  Yes.    Comments:  Pt remain stable during sheath pull. Pt denies any discomfort at this time.  Report given to [facility]. In Short stay

## 2013-12-02 NOTE — CV Procedure (Addendum)
Kentley Bogardus is a 58 y.o. male    [id number] LOCATION:  FACILITY: [facility]  PHYSICIAN: Nanetta Batty, M.D. 1955-07-12   DATE OF PROCEDURE:  12/02/2013  DATE OF DISCHARGE:     PV Angiogram/Intervention    History obtained from chart review.Mr. Piere is a 58 year old moderately overweight [place] male father of 3 children referred by Dr. Sharrell Ku for evaluation of claudication. He is a history of coronary artery disease status post bypass grafting x2 at [facility] April 2013. He has severe ischemic cardiomyopathy with ICD implantation in [place] in 2013,  followed by Dr. Ladona Ridgel. The problems include 100-pack-year history of tobacco abuse currently trying to stop smoking( at least one pack per day), hypertension, hyperlipidemia and diabetes. He does have chronic A. Fib on oral anticoagulation. He complains of occasional chest pain as well as dyspnea. Over the last year has developed right greater than left lower extremity claudication  at less than 50 feet with Dopplers that showed ABIs of 0.6 range bilaterally. I angiogramed him on 08/30/13 revealed a high-grade right external iliac artery stenosis which was stented. He has residual occluded SFAs bilaterally and continues to have lifestyle limiting claudication. He also continues to smoke. I believe he would be high risk for femoral popliteal bypass grafting. He presents today for attempt at percutaneous recatheterization of his left SFA with anticipated staged right SFA intervention as well.    PROCEDURE DESCRIPTION:   The patient was brought to the second floor [facility] cath lab in the postabsorptive state. He was premedicated with Valium 5 mg by mouth, IV Versed and fentanyl. His right groinwas prepped and shaved in usual sterile fashion. Xylocaine 1% was used for local anesthesia. A 5 French sheath was inserted into the right common femoral artery using standard Seldinger technique. A 5 French crossover catheter and angled  catheters were used to obtain contralateral access. The Epistat catheter was placed in the left external iliac artery. Oblique angina with obvious performed of the right and left distal common femoral artery, profunda bifurcations. Omnipaque dye was used for the entirety of the case. Retrograde aortic pressure was monitored during the case.   HEMODYNAMICS:    AO SYSTOLIC/AO DIASTOLIC: 138/78   Angiographic Data:   1: Left lower extremity-the small "nubin" at the origin of the left SFA documented angiographically several months ago has since disappeared making access into the SFA percutaneously problematic.  2: Right lower extremity-small nubbin origin right SFA  IMPRESSION:Mr. Tamm does not have anatomy suitable for percutaneous intervention. The ostial left SFA entry  point has since disappeared since his last angiogram. A total of 17 cc of contrast was administered to the patient. The sheath was removed and pressure was held on the groin to achieve hemostasis. He'll be discharged home after remaining her come in for 4 hours and getting hydration. At this point, I do not think he is a candidate for percutaneous or surgical revascularization. Exercise therapy plus or minus Pletal will be recommended. He left the Cath Lab in stable condition.plans will be to restart Xarelto  tomorrow.   Runell Gess MD, [facility] 12/02/2013 8:19 AM

## 2013-12-02 NOTE — Discharge Instructions (Signed)
Angiogram, Care After °Refer to this sheet in the next few weeks. These instructions provide you with information on caring for yourself after your procedure. Your health care provider may also give you more specific instructions. Your treatment has been planned according to current medical practices, but problems sometimes occur. Call your health care provider if you have any problems or questions after your procedure.  °WHAT TO EXPECT AFTER THE PROCEDURE °After your procedure, it is typical to have the following sensations: °· Minor discomfort or tenderness and a small bump at the catheter insertion site. The bump should usually decrease in size and tenderness within 1 to 2 weeks. °· Any bruising will usually fade within 2 to 4 weeks. °HOME CARE INSTRUCTIONS  °· You may need to keep taking blood thinners if they were prescribed for you. Take medicines only as directed by your health care provider. °· Do not apply powder or lotion to the site. °· Do not take baths, swim, or use a hot tub until your health care provider approves. °· You may shower 24 hours after the procedure. Remove the bandage (dressing) and gently wash the site with plain soap and water. Gently pat the site dry. °· Inspect the site at least twice daily. °· Limit your activity for the first 48 hours. Do not bend, squat, or lift anything over 20 lb (9 kg) or as directed by your health care provider. °· Plan to have someone take you home after the procedure. Follow instructions about when you can drive or return to work. °SEEK MEDICAL CARE IF: °· You get light-headed when standing up. °· You have drainage (other than a small amount of blood on the dressing). °· You have chills. °· You have a fever. °· You have redness, warmth, swelling, or pain at the insertion site. °SEEK IMMEDIATE MEDICAL CARE IF:  °· You develop chest pain or shortness of breath, feel faint, or pass out. °· You have bleeding, swelling larger than a walnut, or drainage from the  catheter insertion site. °· You develop pain, discoloration, coldness, or severe bruising in the leg or arm that held the catheter. °· You have heavy bleeding from the site. If this happens, hold pressure on the site and call 911. °MAKE SURE YOU: °· Understand these instructions. °· Will watch your condition. °· Will get help right away if you are not doing well or get worse. °Document Released: 09/27/2004 Document Revised: 07/26/2013 Document Reviewed: 08/03/2012 °Exit[employer]® Patient Information ©2015 [employer]. This information is not intended to replace advice given to you by your health care provider. Make sure you discuss any questions you have with your health care provider. ° °

## 2014-01-05 ENCOUNTER — Encounter: Payer: Self-pay | Admitting: Cardiology

## 2014-01-12 ENCOUNTER — Ambulatory Visit: Payer: Medicaid Other | Admitting: Cardiology

## 2014-03-03 ENCOUNTER — Encounter (HOSPITAL_COMMUNITY): Payer: Self-pay | Admitting: Cardiovascular Disease

## 2014-03-08 ENCOUNTER — Telehealth: Payer: Self-pay | Admitting: Internal Medicine

## 2014-03-08 NOTE — Telephone Encounter (Signed)
Pt scheduled PCP appt on 03/16/14 but is out of several medication. Please f/u with pt.

## 2014-03-11 ENCOUNTER — Other Ambulatory Visit: Payer: Self-pay | Admitting: Internal Medicine

## 2014-03-16 ENCOUNTER — Ambulatory Visit: Payer: Medicaid Other | Admitting: Internal Medicine

## 2014-04-04 ENCOUNTER — Other Ambulatory Visit: Payer: Self-pay | Admitting: Internal Medicine

## 2014-04-12 ENCOUNTER — Other Ambulatory Visit: Payer: Self-pay | Admitting: Internal Medicine

## 2014-04-12 DIAGNOSIS — I10 Essential (primary) hypertension: Secondary | ICD-10-CM

## 2014-04-12 DIAGNOSIS — I4891 Unspecified atrial fibrillation: Secondary | ICD-10-CM

## 2014-04-12 DIAGNOSIS — I5022 Chronic systolic (congestive) heart failure: Secondary | ICD-10-CM

## 2014-04-12 DIAGNOSIS — I5043 Acute on chronic combined systolic (congestive) and diastolic (congestive) heart failure: Secondary | ICD-10-CM

## 2014-04-12 MED ORDER — LISINOPRIL 2.5 MG PO TABS: 2.5000 mg | ORAL_TABLET | Freq: Every day | ORAL | Status: DC

## 2014-04-12 MED ORDER — FUROSEMIDE 40 MG PO TABS: 40.0000 mg | ORAL_TABLET | Freq: Every day | ORAL | Status: DC

## 2014-04-12 MED ORDER — CARVEDILOL 25 MG PO TABS: 25.0000 mg | ORAL_TABLET | Freq: Two times a day (BID) | ORAL | Status: DC

## 2014-04-12 MED ORDER — SPIRONOLACTONE 25 MG PO TABS: 25.0000 mg | ORAL_TABLET | Freq: Every day | ORAL | Status: DC

## 2014-04-12 MED ORDER — GLIPIZIDE 5 MG PO TABS: 5.0000 mg | ORAL_TABLET | Freq: Two times a day (BID) | ORAL | Status: DC

## 2014-04-12 MED ORDER — RIVAROXABAN 20 MG PO TABS: 20.0000 mg | ORAL_TABLET | Freq: Every day | ORAL | Status: DC

## 2014-04-25 ENCOUNTER — Ambulatory Visit: Payer: Medicaid Other | Admitting: Internal Medicine

## 2014-08-01 ENCOUNTER — Inpatient Hospital Stay (HOSPITAL_COMMUNITY)
Admission: EM | Admit: 2014-08-01 | Discharge: 2014-08-01 | DRG: 194 | Disposition: A | Discharge status: Home / Self Care | Payer: Medicaid Other | Attending: Internal Medicine | Admitting: Internal Medicine

## 2014-08-01 ENCOUNTER — Emergency Department (HOSPITAL_COMMUNITY): Payer: Medicaid Other

## 2014-08-01 ENCOUNTER — Encounter (HOSPITAL_COMMUNITY): Payer: Self-pay | Admitting: Emergency Medicine

## 2014-08-01 DIAGNOSIS — D649 Anemia, unspecified: Secondary | ICD-10-CM

## 2014-08-01 DIAGNOSIS — I4891 Unspecified atrial fibrillation: Secondary | ICD-10-CM | POA: Diagnosis present

## 2014-08-01 DIAGNOSIS — Z7901 Long term (current) use of anticoagulants: Secondary | ICD-10-CM | POA: Diagnosis not present

## 2014-08-01 DIAGNOSIS — D638 Anemia in other chronic diseases classified elsewhere: Secondary | ICD-10-CM | POA: Diagnosis present

## 2014-08-01 DIAGNOSIS — J189 Pneumonia, unspecified organism: Principal | ICD-10-CM

## 2014-08-01 DIAGNOSIS — R0602 Shortness of breath: Secondary | ICD-10-CM

## 2014-08-01 DIAGNOSIS — I10 Essential (primary) hypertension: Secondary | ICD-10-CM | POA: Diagnosis present

## 2014-08-01 DIAGNOSIS — Z95810 Presence of automatic (implantable) cardiac defibrillator: Secondary | ICD-10-CM | POA: Diagnosis not present

## 2014-08-01 DIAGNOSIS — Z951 Presence of aortocoronary bypass graft: Secondary | ICD-10-CM | POA: Diagnosis not present

## 2014-08-01 DIAGNOSIS — F17210 Nicotine dependence, cigarettes, uncomplicated: Secondary | ICD-10-CM | POA: Diagnosis present

## 2014-08-01 DIAGNOSIS — I5022 Chronic systolic (congestive) heart failure: Secondary | ICD-10-CM | POA: Diagnosis present

## 2014-08-01 DIAGNOSIS — E119 Type 2 diabetes mellitus without complications: Secondary | ICD-10-CM | POA: Diagnosis present

## 2014-08-01 DIAGNOSIS — I2510 Atherosclerotic heart disease of native coronary artery without angina pectoris: Secondary | ICD-10-CM | POA: Diagnosis present

## 2014-08-01 DIAGNOSIS — D509 Iron deficiency anemia, unspecified: Secondary | ICD-10-CM | POA: Diagnosis present

## 2014-08-01 DIAGNOSIS — E1151 Type 2 diabetes mellitus with diabetic peripheral angiopathy without gangrene: Secondary | ICD-10-CM | POA: Diagnosis present

## 2014-08-01 DIAGNOSIS — R739 Hyperglycemia, unspecified: Secondary | ICD-10-CM

## 2014-08-01 DIAGNOSIS — I5042 Chronic combined systolic (congestive) and diastolic (congestive) heart failure: Secondary | ICD-10-CM | POA: Diagnosis present

## 2014-08-01 DIAGNOSIS — I739 Peripheral vascular disease, unspecified: Secondary | ICD-10-CM | POA: Diagnosis present

## 2014-08-01 DIAGNOSIS — I5021 Acute systolic (congestive) heart failure: Secondary | ICD-10-CM

## 2014-08-01 DIAGNOSIS — Z720 Tobacco use: Secondary | ICD-10-CM | POA: Diagnosis present

## 2014-08-01 LAB — I-STAT CHEM 8, ED
BUN: 20 mg/dL (ref 6–20)
CHLORIDE: 94 mmol/L — AB (ref 101–111)
CREATININE: 1.1 mg/dL (ref 0.61–1.24)
Calcium, Ion: 1.04 mmol/L — ABNORMAL LOW (ref 1.12–1.23)
Glucose, Bld: 395 mg/dL — ABNORMAL HIGH (ref 70–99)
HEMATOCRIT: 35 % — AB (ref 39.0–52.0)
HEMOGLOBIN: 11.9 g/dL — AB (ref 13.0–17.0)
POTASSIUM: 3.9 mmol/L (ref 3.5–5.1)
SODIUM: 134 mmol/L — AB (ref 135–145)
TCO2: 23 mmol/L (ref 0–100)

## 2014-08-01 LAB — COMPREHENSIVE METABOLIC PANEL
ALT: 11 U/L — AB (ref 17–63)
AST: 15 U/L (ref 15–41)
Albumin: 2.7 g/dL — ABNORMAL LOW (ref 3.5–5.0)
Alkaline Phosphatase: 102 U/L (ref 38–126)
Anion gap: 7 (ref 5–15)
BILIRUBIN TOTAL: 1 mg/dL (ref 0.3–1.2)
BUN: 18 mg/dL (ref 6–20)
CO2: 27 mmol/L (ref 22–32)
Calcium: 8 mg/dL — ABNORMAL LOW (ref 8.9–10.3)
Chloride: 98 mmol/L — ABNORMAL LOW (ref 101–111)
Creatinine, Ser: 1.17 mg/dL (ref 0.61–1.24)
GFR calc Af Amer: >60 mL/min (ref >60)
Glucose, Bld: 372 mg/dL — ABNORMAL HIGH (ref 70–99)
Potassium: 3.9 mmol/L (ref 3.5–5.1)
Sodium: 132 mmol/L — ABNORMAL LOW (ref 135–145)
Total Protein: 5.8 g/dL — ABNORMAL LOW (ref 6.5–8.1)

## 2014-08-01 LAB — URINALYSIS, ROUTINE W REFLEX MICROSCOPIC
Bilirubin Urine: NEGATIVE
Glucose, UA: 100 mg/dL — AB
HGB URINE DIPSTICK: NEGATIVE
Ketones, ur: NEGATIVE mg/dL
Leukocytes, UA: NEGATIVE
Nitrite: NEGATIVE
Protein, ur: 30 mg/dL — AB
SPECIFIC GRAVITY, URINE: 1.015 (ref 1.005–1.030)
UROBILINOGEN UA: 1 mg/dL (ref 0.0–1.0)
pH: 6 (ref 5.0–8.0)

## 2014-08-01 LAB — URINE MICROSCOPIC-ADD ON

## 2014-08-01 LAB — I-STAT CG4 LACTIC ACID, ED
LACTIC ACID, VENOUS: 2.42 mmol/L — AB (ref 0.5–2.0)
Lactic Acid, Venous: 1.98 mmol/L (ref 0.5–2.0)

## 2014-08-01 LAB — CBC
HCT: 33.1 % — ABNORMAL LOW (ref 39.0–52.0)
Hemoglobin: 9.4 g/dL — ABNORMAL LOW (ref 13.0–17.0)
MCH: 20.5 pg — ABNORMAL LOW (ref 26.0–34.0)
MCHC: 28.4 g/dL — ABNORMAL LOW (ref 30.0–36.0)
MCV: 72.1 fL — ABNORMAL LOW (ref 78.0–100.0)
Platelets: 187 K/uL (ref 150–400)
RBC: 4.59 MIL/uL (ref 4.22–5.81)
RDW: 20.6 % — ABNORMAL HIGH (ref 11.5–15.5)
WBC: 7.4 K/uL (ref 4.0–10.5)

## 2014-08-01 LAB — BRAIN NATRIURETIC PEPTIDE: B Natriuretic Peptide: 960.6 pg/mL — ABNORMAL HIGH (ref 0.0–100.0)

## 2014-08-01 LAB — STREP PNEUMONIAE URINARY ANTIGEN: STREP PNEUMO URINARY ANTIGEN: NEGATIVE

## 2014-08-01 LAB — CBG MONITORING, ED
GLUCOSE-CAPILLARY: 220 mg/dL — AB (ref 70–99)
Glucose-Capillary: 83 mg/dL (ref 70–99)

## 2014-08-01 LAB — I-STAT TROPONIN, ED: TROPONIN I, POC: 0.03 ng/mL (ref 0.00–0.08)

## 2014-08-01 LAB — DIGOXIN LEVEL: Digoxin Level: <0.2 ng/mL — ABNORMAL LOW (ref 0.8–2.0)

## 2014-08-01 LAB — PROTIME-INR
INR: 1.4 (ref 0.00–1.49)
PROTHROMBIN TIME: 17.3 s — AB (ref 11.6–15.2)

## 2014-08-01 MED ORDER — LEVOFLOXACIN 500 MG PO TABS
500.0000 mg | ORAL_TABLET | Freq: Every day | ORAL | Status: DC
Start: 2014-08-01 — End: 2014-08-01

## 2014-08-01 MED ORDER — DEXTROSE 5 % IV SOLN: 1.0000 g | Freq: Three times a day (TID) | INTRAVENOUS | Status: DC

## 2014-08-01 MED ORDER — VANCOMYCIN HCL IN DEXTROSE 750-5 MG/150ML-% IV SOLN
750.0000 mg | Freq: Two times a day (BID) | INTRAVENOUS | Status: DC
  Filled 2014-08-01: qty 150

## 2014-08-01 MED ORDER — FUROSEMIDE 10 MG/ML IJ SOLN
40.0000 mg | INTRAMUSCULAR | Status: AC
  Administered 2014-08-01: 40 mg via INTRAVENOUS
  Filled 2014-08-01: qty 4

## 2014-08-01 MED ORDER — VANCOMYCIN HCL 10 G IV SOLR
2000.0000 mg | Freq: Once | INTRAVENOUS | Status: AC
  Administered 2014-08-01: 2000 mg via INTRAVENOUS
  Filled 2014-08-01: qty 2000

## 2014-08-01 MED ORDER — DEXTROSE 5 % IV SOLN: 2.0000 g | Freq: Three times a day (TID) | INTRAVENOUS | Status: DC

## 2014-08-01 MED ORDER — VANCOMYCIN HCL IN DEXTROSE 1-5 GM/200ML-% IV SOLN
1000.0000 mg | Freq: Once | INTRAVENOUS | Status: DC
  Filled 2014-08-01: qty 200

## 2014-08-01 MED ORDER — DEXTROSE 5 % IV SOLN
2.0000 g | Freq: Once | INTRAVENOUS | Status: AC
  Administered 2014-08-01: 2 g via INTRAVENOUS
  Filled 2014-08-01: qty 2

## 2014-08-01 MED ORDER — INSULIN ASPART 100 UNIT/ML ~~LOC~~ SOLN
10.0000 [IU] | Freq: Once | SUBCUTANEOUS | Status: AC
  Administered 2014-08-01: 10 [IU] via INTRAVENOUS
  Filled 2014-08-01: qty 1

## 2014-08-01 MED ORDER — SODIUM CHLORIDE 0.9 % IV BOLUS (SEPSIS)
3000.0000 mL | Freq: Once | INTRAVENOUS | Status: AC
  Administered 2014-08-01: 1000 mL via INTRAVENOUS

## 2014-08-01 MED ORDER — LEVOFLOXACIN 500 MG PO TABS: 500.0000 mg | ORAL_TABLET | Freq: Every day | ORAL | Status: DC

## 2014-08-01 MED ORDER — GLIPIZIDE 5 MG PO TABS: 5.0000 mg | ORAL_TABLET | Freq: Two times a day (BID) | ORAL | Status: DC

## 2014-08-01 NOTE — ED Notes (Signed)
Pt given urinal for sample 

## 2014-08-01 NOTE — ED Notes (Signed)
Appt time - 19:05

## 2014-08-01 NOTE — ED Notes (Signed)
CBG 83 

## 2014-08-01 NOTE — ED Notes (Signed)
Per EMS- pt presents to ED for evaluation of left sided chest pain. Productive cough, yellow in color, has been having pain since yesterday. Worse with coughing and deep breathing. Sats 95% on RA. 12 lead showing a fib pt in a fib at this time at rate of 88, recent hx of triple bypass. Pt alert and oriented. 324 mg aspirin and 2 nitro, pain relieved from 6/10 to 4/10 after nitro.

## 2014-08-01 NOTE — ED Notes (Signed)
Pt wants to leave hospital. RN educated pt on the importance of him staying overnight in the hospital. Pt verbalized understanding but still insists on leaving. RN paged MD. MD spoke with pt on the phone, instructing pt to pick up his antibiotics and take full dose, to go to the wellness clinic tomorrow for blood pressure check, and to avoid taking his blood pressure medications at home to avoid hypotension.pt agrees to finish IV antibiotics here in the ED.

## 2014-08-01 NOTE — ED Notes (Signed)
Pt refusing rectal temperature 

## 2014-08-01 NOTE — Progress Notes (Addendum)
ANTIBIOTIC CONSULT NOTE - INITIAL  Pharmacy Consult for Vancomycin and Cefepime Indication: HCAP  No Known Allergies  Patient Measurements: Weight: 242 lb 3.2 oz (109.861 kg)  Vital Signs: Temp: 98.5 F (36.9 C) (05/09 1438) Temp Source: Oral (05/09 1438) BP: 91/63 mmHg (05/09 1515) Pulse Rate: 89 (05/09 1515) Intake/Output from previous day:   Intake/Output from this shift:    Labs:  Recent Labs  08/01/14 1453 08/01/14 1505  WBC 7.4  --   HGB 9.4* 11.9*  PLT 187  --   CREATININE 1.17 1.10   CrCl cannot be calculated (Unknown ideal weight.). No results for input(s): VANCOTROUGH, VANCOPEAK, VANCORANDOM, GENTTROUGH, GENTPEAK, GENTRANDOM, TOBRATROUGH, TOBRAPEAK, TOBRARND, AMIKACINPEAK, AMIKACINTROU, AMIKACIN in the last 72 hours.   Microbiology: No results found for this or any previous visit (from the past 720 hour(s)).  Medical History: Past Medical History  Diagnosis Date  . Hypertension   . Systolic heart failure     EF is 40-45% by echo, December 2013  . Noncompliance   . CAD (coronary artery disease) Sept 2013    s/p cardiac cath showing occlusion of small RCA with collaterals  . Chronic anticoagulation     on coumadin  . Atrial fibrillation   . Peripheral arterial disease   . Automatic implantable cardioverter-defibrillator in situ   . High cholesterol   . CHF (congestive heart failure)   . Heart murmur   . Myocardial infarction 2014  . Type II diabetes mellitus     Medications:  Scheduled:   Infusions:  . ceFEPime (MAXIPIME) IV    . vancomycin     Assessment: 59 yo M with history of HFrEF, PAF, CAD s/p CABG and DMII presenting to ED on 08/01/2014 with 4 days of cough, peripheral edema and weight gain with CXR c/w potential atelectasis vs pneumonia. Pharmacy consulted to dose vancomycin and cefepime for PNA. Patient currently afebrile, WBC wnl, SCr 1.10 (normalized CrCl ~75 ml/min). Blood and urine cultures ordered.   Goal of Therapy:  Vancomycin  trough level 15-20 mcg/ml  Plan:  - Initiate vancomycin 2000 mg IV x 1, followed by 750 mg IV q12h - Initiate cefepime 1 gm IV q8h - Monitor renal function, temp, WBC, C&S, VT at [facility] K. Bonnye Fava, PharmD, BCPS Clinical Pharmacist - Resident Pager: [phone] Pharmacy: [phone] 08/01/2014 4:08 PM

## 2014-08-01 NOTE — ED Provider Notes (Signed)
CSN: [id number]     Arrival date & time 08/01/14  1425 History   None    Chief Complaint  Patient presents with  . Chest Pain     (Consider location/radiation/quality/duration/timing/severity/associated sxs/prior Treatment) HPI Comments: 60 year old male, history of systolic heart failure, last echocardiogram showed an ejection fraction of 25%, also has a history of coronary artery disease status post CABG, history of peripheral arterial disease and has been admitted to the hospital multiple times for atrial fibrillation, care associated pneumonia and congestive heart failure. He states that approximately 4 days ago he started to have increasing coughing, peripheral edema and swelling and weight gain. He has been unable to lay flat and has orthopnea, his symptoms are persistent, gradually worsening and are now severe. Paramedics noted the patient to be in atrial fibrillation, heart rate between 90 and 110, mildly hypotensive at approximately 90, the patient denies fevers chills abdominal pain back pain or difficulty urinating or diarrhea. He endorses taking his Lasix regularly  Patient is a 59 y.o. male presenting with chest pain. The history is provided by the patient.  Chest Pain   Past Medical History  Diagnosis Date  . Hypertension   . Systolic heart failure     EF is 40-45% by echo, December 2013  . Noncompliance   . CAD (coronary artery disease) Sept 2013    s/p cardiac cath showing occlusion of small RCA with collaterals  . Chronic anticoagulation     on coumadin  . Atrial fibrillation   . Peripheral arterial disease   . Automatic implantable cardioverter-defibrillator in situ   . High cholesterol   . CHF (congestive heart failure)   . Heart murmur   . Myocardial infarction 2014  . Type II diabetes mellitus    Past Surgical History  Procedure Laterality Date  . Implantable cardioverter defibrillator implant      [place] in 07/2012; [employer]  . Coronary artery  bypass graft  09/2012    2 vessels per patient [name]   . Coronary angioplasty with stent placement  11/2011    "1"  . Cardiac catheterization  09/2012  . Iliac artery stent Right 08/30/2013  . Left and right heart catheterization with coronary angiogram N/A 12/02/2011    Procedure: LEFT AND RIGHT HEART CATHETERIZATION WITH CORONARY ANGIOGRAM;  Surgeon: Kathleene Hazel, MD;  Location: [facility] CATH LAB;  Service: Cardiovascular;  Laterality: N/A;  . Lower extremity angiogram N/A 08/30/2013    Procedure: LOWER EXTREMITY ANGIOGRAM;  Surgeon: Runell Gess, MD;  Location: [facility] CATH LAB;  Service: Cardiovascular;  Laterality: N/A;  . Lower extremity angiogram N/A 12/02/2013    Procedure: LOWER EXTREMITY ANGIOGRAM;  Surgeon: Runell Gess, MD;  Location: [facility] CATH LAB;  Service: Cardiovascular;  Laterality: N/A;   Family History  Problem Relation Age of Onset  . Diabetes Mother    History  Substance Use Topics  . Smoking status: Current Every Day Smoker -- 0.25 packs/day for 38 years    Types: Cigarettes  . Smokeless tobacco: Never Used  . Alcohol Use: No    Review of Systems  Cardiovascular: Positive for chest pain.  All other systems reviewed and are negative.     Allergies  Review of patient's allergies indicates no known allergies.  Home Medications   Prior to Admission medications   Medication Sig Start Date End Date Taking? Authorizing Provider  carvedilol (COREG) 25 MG tablet Take 1 tablet (25 mg total) by mouth 2 (two) times daily. 04/12/14  Yes [name], MD  furosemide (LASIX) 40 MG tablet Take 1 tablet (40 mg total) by mouth daily. 04/12/14  Yes Olugbemiga Annitta Needs, MD  glipiZIDE (GLUCOTROL) 5 MG tablet Take 1 tablet (5 mg total) by mouth 2 (two) times daily before a meal. 04/12/14  Yes Olugbemiga E Jegede, MD  lisinopril (ZESTRIL) 2.5 MG tablet Take 1 tablet (2.5 mg total) by mouth daily. 04/12/14  Yes [name], MD  rivaroxaban (XARELTO) 20 MG TABS  tablet Take 1 tablet (20 mg total) by mouth daily before supper. 04/12/14  Yes [name], MD  spironolactone (ALDACTONE) 25 MG tablet Take 1 tablet (25 mg total) by mouth daily. 04/12/14  Yes [name], MD   BP 91/63 mmHg  Pulse 89  Temp(Src) 98.5 F (36.9 C) (Oral)  Resp 19  Wt 242 lb 3.2 oz (109.861 kg)  SpO2 99% Physical Exam  Constitutional: He appears well-developed and well-nourished. No distress.  HENT:  Head: Normocephalic and atraumatic.  Mouth/Throat: Oropharynx is clear and moist. No oropharyngeal exudate.  Eyes: Conjunctivae and EOM are normal. Pupils are equal, round, and reactive to light. Right eye exhibits no discharge. Left eye exhibits no discharge. No scleral icterus.  Neck: Normal range of motion. Neck supple. No JVD present. No thyromegaly present.  Cardiovascular: Normal heart sounds and intact distal pulses.  Exam reveals no gallop and no friction rub.   No murmur heard. Tachycardia, atrial fibrillation  Pulmonary/Chest: Effort normal. No respiratory distress. He has no wheezes. He has rales (subtle rales at the bases).  Abdominal: Soft. Bowel sounds are normal. He exhibits no distension and no mass. There is no tenderness.  Musculoskeletal: Normal range of motion. He exhibits edema (bilateral symmetrical pitting pulmonary edema below the knees). He exhibits no tenderness.  Lymphadenopathy:    He has no cervical adenopathy.  Neurological: He is alert. Coordination normal.  Skin: Skin is warm and dry. No rash noted. No erythema.  Psychiatric: He has a normal mood and affect. His behavior is normal.  Nursing note and vitals reviewed.   ED Course  Procedures (including critical care time) Labs Review Labs Reviewed  CBC - Abnormal; Notable for the following:    Hemoglobin 9.4 (*)    HCT 33.1 (*)    MCV 72.1 (*)    MCH 20.5 (*)    MCHC 28.4 (*)    RDW 20.6 (*)    All other components within normal limits  COMPREHENSIVE METABOLIC PANEL -  Abnormal; Notable for the following:    Sodium 132 (*)    Chloride 98 (*)    Glucose, Bld 372 (*)    Calcium 8.0 (*)    Total Protein 5.8 (*)    Albumin 2.7 (*)    ALT 11 (*)    All other components within normal limits  BRAIN NATRIURETIC PEPTIDE - Abnormal; Notable for the following:    B Natriuretic Peptide 960.6 (*)    All other components within normal limits  PROTIME-INR - Abnormal; Notable for the following:    Prothrombin Time 17.3 (*)    All other components within normal limits  I-STAT CHEM 8, ED - Abnormal; Notable for the following:    Sodium 134 (*)    Chloride 94 (*)    Glucose, Bld 395 (*)    Calcium, Ion 1.04 (*)    Hemoglobin 11.9 (*)    HCT 35.0 (*)    All other components within normal limits  CULTURE, BLOOD (ROUTINE  X 2)  CULTURE, BLOOD (ROUTINE X 2)  URINE CULTURE  DIGOXIN LEVEL  URINALYSIS, ROUTINE W REFLEX MICROSCOPIC  I-STAT TROPOININ, ED  I-STAT CG4 LACTIC ACID, ED    Imaging Review Dg Chest Port 1 View  08/01/2014   CLINICAL DATA:  Left chest pain.  Productive cough.  EXAM: PORTABLE CHEST - 1 VIEW  COMPARISON:  05/12/2013.  FINDINGS: Stable enlarged cardiac silhouette and median sternotomy wires. Small right pleural effusion without significant change. Small amount of adjacent airspace opacity with progression. Diffusely prominent pulmonary vasculature and interstitial markings with improvement. Stable left subclavian pacemaker. The distal portion of the lead is not clearly visualized due to overlapping soft tissues. Unremarkable bones.  IMPRESSION: 1. Stable small right pleural effusion. 2. Mild increase in patchy opacity at the right lung base. This could represent atelectasis or pneumonia. 3. Stable cardiomegaly with improved changes of congestive heart failure.   Electronically Signed   By: Beckie Salts M.D.   On: 08/01/2014 15:23    ED ECG REPORT  I personally interpreted this EKG   Date: 08/01/2014   Rate: 101  Rhythm: atrial fibrillation  QRS  Axis: left  Intervals: normal  ST/T Wave abnormalities: nonspecific ST/T changes  Conduction Disutrbances:none  Narrative Interpretation:   Old EKG Reviewed: changes noted - mild TWave abn in lateral precordial leads   MDM   Final diagnoses:  CAP (community acquired pneumonia)  Hyperglycemia  Anemia, unspecified anemia type  Acute systolic congestive heart failure    The patient has tachycardia, slight hypotension, is coughing up sputum but also has rales on exam with a fluid overloaded state that could be consistent with congestive heart failure knowing that he has a poor ejection fraction. EKG shows atrial fibrillation, pulse of 101, no other signs of acute ischemia.  The patient is persistently mildly hypotensive with a blood pressure of between 95 and 105 systolic, he is afebrile, pulse is between 90 and 110, oxygen is between 95 and 98%, 2 L of oxygen.  Labs show a normal white blood cell count of 7400, hyperglycemia at 395, anemia at 9.4 hemoglobin, renal function is preserved, BNP at 960, normal troponin, INR is subtherapeutic at 1.4 and chest x-ray shows a patchy opacity at the right lung base consistent with likely pneumonia given the patient's clinical status of purulent sputum. He has been given fluids, his hypotension is concerning especially with his history consistent with pneumonia, will give IV fluids as the patient is tachycardic and hypotensive howev he does not have a leukocytosis or fever or hypoxia or tachypnea that would serve as sepsis criteria. We will gauge his response with fluids, admit to high level of care, the patient will obviously need diuresis and admits that he has not been using his Lasix.  Discussed care with the hospitalist they will admit the patient to the stepdown unit, critical care provided for ongoing hypertension with the presence of community acquired pneumonia and fluid overload from congestive heart failure.  CRITICAL CARE Performed by:  Vida Roller Total critical care time: 35 Critical care time was exclusive of separately billable procedures and treating other patients. Critical care was necessary to treat or prevent imminent or life-threatening deterioration. Critical care was time spent personally by me on the following activities: development of treatment plan with patient and/or surrogate as well as nursing, discussions with consultants, evaluation of patient's response to treatment, examination of patient, obtaining history from patient or surrogate, ordering and performing treatments and interventions, ordering and review of  laboratory studies, ordering and review of radiographic studies, pulse oximetry and re-evaluation of patient's condition.   Eber Hong, MD 08/01/14 [phone]

## 2014-08-02 ENCOUNTER — Encounter (HOSPITAL_COMMUNITY): Payer: Self-pay | Admitting: *Deleted

## 2014-08-02 ENCOUNTER — Telehealth (HOSPITAL_BASED_OUTPATIENT_CLINIC_OR_DEPARTMENT_OTHER): Payer: Self-pay | Admitting: Emergency Medicine

## 2014-08-02 DIAGNOSIS — I10 Essential (primary) hypertension: Secondary | ICD-10-CM | POA: Diagnosis present

## 2014-08-02 DIAGNOSIS — F17210 Nicotine dependence, cigarettes, uncomplicated: Secondary | ICD-10-CM | POA: Diagnosis present

## 2014-08-02 DIAGNOSIS — J189 Pneumonia, unspecified organism: Principal | ICD-10-CM | POA: Diagnosis present

## 2014-08-02 DIAGNOSIS — D649 Anemia, unspecified: Secondary | ICD-10-CM | POA: Diagnosis present

## 2014-08-02 DIAGNOSIS — E785 Hyperlipidemia, unspecified: Secondary | ICD-10-CM | POA: Diagnosis present

## 2014-08-02 DIAGNOSIS — R7881 Bacteremia: Secondary | ICD-10-CM | POA: Diagnosis present

## 2014-08-02 DIAGNOSIS — E1165 Type 2 diabetes mellitus with hyperglycemia: Secondary | ICD-10-CM | POA: Diagnosis present

## 2014-08-02 DIAGNOSIS — Z951 Presence of aortocoronary bypass graft: Secondary | ICD-10-CM

## 2014-08-02 DIAGNOSIS — Z9119 Patient's noncompliance with other medical treatment and regimen: Secondary | ICD-10-CM | POA: Diagnosis present

## 2014-08-02 DIAGNOSIS — I248 Other forms of acute ischemic heart disease: Secondary | ICD-10-CM | POA: Diagnosis present

## 2014-08-02 DIAGNOSIS — I272 Other secondary pulmonary hypertension: Secondary | ICD-10-CM | POA: Diagnosis present

## 2014-08-02 DIAGNOSIS — I252 Old myocardial infarction: Secondary | ICD-10-CM

## 2014-08-02 DIAGNOSIS — Y95 Nosocomial condition: Secondary | ICD-10-CM | POA: Diagnosis present

## 2014-08-02 DIAGNOSIS — Z7901 Long term (current) use of anticoagulants: Secondary | ICD-10-CM

## 2014-08-02 DIAGNOSIS — I5043 Acute on chronic combined systolic (congestive) and diastolic (congestive) heart failure: Secondary | ICD-10-CM | POA: Diagnosis present

## 2014-08-02 DIAGNOSIS — I482 Chronic atrial fibrillation: Secondary | ICD-10-CM | POA: Diagnosis present

## 2014-08-02 DIAGNOSIS — I739 Peripheral vascular disease, unspecified: Secondary | ICD-10-CM | POA: Diagnosis present

## 2014-08-02 DIAGNOSIS — J9600 Acute respiratory failure, unspecified whether with hypoxia or hypercapnia: Secondary | ICD-10-CM | POA: Diagnosis present

## 2014-08-02 DIAGNOSIS — E780 Pure hypercholesterolemia: Secondary | ICD-10-CM | POA: Diagnosis present

## 2014-08-02 DIAGNOSIS — I2510 Atherosclerotic heart disease of native coronary artery without angina pectoris: Secondary | ICD-10-CM | POA: Diagnosis present

## 2014-08-02 LAB — CBC WITH DIFFERENTIAL/PLATELET
BASOS PCT: 1 % (ref 0–1)
Basophils Absolute: 0.1 10*3/uL (ref 0.0–0.1)
EOS ABS: 0.2 10*3/uL (ref 0.0–0.7)
Eosinophils Relative: 2 % (ref 0–5)
HCT: 37.3 % — ABNORMAL LOW (ref 39.0–52.0)
HEMOGLOBIN: 10.6 g/dL — AB (ref 13.0–17.0)
Lymphocytes Relative: 20 % (ref 12–46)
Lymphs Abs: 1.6 10*3/uL (ref 0.7–4.0)
MCH: 20.3 pg — ABNORMAL LOW (ref 26.0–34.0)
MCHC: 28.4 g/dL — AB (ref 30.0–36.0)
MCV: 71.6 fL — AB (ref 78.0–100.0)
Monocytes Absolute: 0.9 10*3/uL (ref 0.1–1.0)
Monocytes Relative: 10 % (ref 3–12)
NEUTROS PCT: 67 % (ref 43–77)
Neutro Abs: 5.5 10*3/uL (ref 1.7–7.7)
Platelets: 233 10*3/uL (ref 150–400)
RBC: 5.21 MIL/uL (ref 4.22–5.81)
RDW: 20.4 % — ABNORMAL HIGH (ref 11.5–15.5)
WBC: 8.3 10*3/uL (ref 4.0–10.5)

## 2014-08-02 LAB — URINE CULTURE
COLONY COUNT: NO GROWTH
Culture: NO GROWTH

## 2014-08-02 LAB — COMPREHENSIVE METABOLIC PANEL
ALK PHOS: 124 U/L (ref 38–126)
ALT: 12 U/L — ABNORMAL LOW (ref 17–63)
ANION GAP: 12 (ref 5–15)
AST: 19 U/L (ref 15–41)
Albumin: 3.2 g/dL — ABNORMAL LOW (ref 3.5–5.0)
BUN: 23 mg/dL — ABNORMAL HIGH (ref 6–20)
CO2: 25 mmol/L (ref 22–32)
CREATININE: 1.4 mg/dL — AB (ref 0.61–1.24)
Calcium: 8.5 mg/dL — ABNORMAL LOW (ref 8.9–10.3)
Chloride: 96 mmol/L — ABNORMAL LOW (ref 101–111)
GFR calc Af Amer: >60 mL/min (ref >60)
GFR calc non Af Amer: 54 mL/min — ABNORMAL LOW (ref >60)
Glucose, Bld: 267 mg/dL — ABNORMAL HIGH (ref 70–99)
Potassium: 3.8 mmol/L (ref 3.5–5.1)
Sodium: 133 mmol/L — ABNORMAL LOW (ref 135–145)
Total Bilirubin: 1.3 mg/dL — ABNORMAL HIGH (ref 0.3–1.2)
Total Protein: 6.8 g/dL (ref 6.5–8.1)

## 2014-08-02 LAB — HIV ANTIBODY (ROUTINE TESTING W REFLEX): HIV SCREEN 4TH GENERATION: NONREACTIVE

## 2014-08-02 LAB — LEGIONELLA ANTIGEN, URINE

## 2014-08-02 LAB — I-STAT CG4 LACTIC ACID, ED: LACTIC ACID, VENOUS: 1.99 mmol/L (ref 0.5–2.0)

## 2014-08-02 NOTE — Telephone Encounter (Signed)
Spoke with pt and informed him that he needs to return to ED today for reeval/probable admission /indicated importance that he do so, vebalizes understanding , states will return asap

## 2014-08-02 NOTE — Telephone Encounter (Signed)
[facility] called + blood cultures gram + cocci in clusters, Arthor Captain PA in the ED notified and telephone order for pt to return to ED for reeval/ treatment, will notify patient

## 2014-08-02 NOTE — ED Notes (Signed)
Pt in stating he was called at home tonight and told to come to hospital due to abnormal lab work, pt unsure which tests they were referring to, review of chart indicates possible elevated lactic acid and positive blood cultures, pt c/o generalized fatigue, no distress noted

## 2014-08-03 ENCOUNTER — Observation Stay (HOSPITAL_COMMUNITY): Payer: Medicaid Other

## 2014-08-03 ENCOUNTER — Inpatient Hospital Stay (HOSPITAL_COMMUNITY)
Admission: EM | Admit: 2014-08-03 | Discharge: 2014-08-05 | DRG: 193 | Discharge status: Against Medical Advice | Payer: Medicaid Other | Attending: Internal Medicine | Admitting: Internal Medicine

## 2014-08-03 ENCOUNTER — Other Ambulatory Visit (HOSPITAL_COMMUNITY): Payer: Self-pay

## 2014-08-03 ENCOUNTER — Other Ambulatory Visit (HOSPITAL_COMMUNITY): Payer: Medicaid Other

## 2014-08-03 ENCOUNTER — Encounter (HOSPITAL_COMMUNITY): Payer: Self-pay | Admitting: Internal Medicine

## 2014-08-03 DIAGNOSIS — F17210 Nicotine dependence, cigarettes, uncomplicated: Secondary | ICD-10-CM | POA: Diagnosis present

## 2014-08-03 DIAGNOSIS — E1165 Type 2 diabetes mellitus with hyperglycemia: Secondary | ICD-10-CM | POA: Diagnosis present

## 2014-08-03 DIAGNOSIS — R7881 Bacteremia: Secondary | ICD-10-CM | POA: Insufficient documentation

## 2014-08-03 DIAGNOSIS — D649 Anemia, unspecified: Secondary | ICD-10-CM | POA: Diagnosis present

## 2014-08-03 DIAGNOSIS — I252 Old myocardial infarction: Secondary | ICD-10-CM | POA: Diagnosis not present

## 2014-08-03 DIAGNOSIS — I482 Chronic atrial fibrillation: Secondary | ICD-10-CM | POA: Diagnosis present

## 2014-08-03 DIAGNOSIS — E785 Hyperlipidemia, unspecified: Secondary | ICD-10-CM | POA: Diagnosis present

## 2014-08-03 DIAGNOSIS — I10 Essential (primary) hypertension: Secondary | ICD-10-CM | POA: Diagnosis present

## 2014-08-03 DIAGNOSIS — I5043 Acute on chronic combined systolic (congestive) and diastolic (congestive) heart failure: Secondary | ICD-10-CM | POA: Diagnosis present

## 2014-08-03 DIAGNOSIS — J9600 Acute respiratory failure, unspecified whether with hypoxia or hypercapnia: Secondary | ICD-10-CM | POA: Diagnosis present

## 2014-08-03 DIAGNOSIS — Z7901 Long term (current) use of anticoagulants: Secondary | ICD-10-CM | POA: Diagnosis not present

## 2014-08-03 DIAGNOSIS — I272 Other secondary pulmonary hypertension: Secondary | ICD-10-CM | POA: Diagnosis present

## 2014-08-03 DIAGNOSIS — E780 Pure hypercholesterolemia: Secondary | ICD-10-CM | POA: Diagnosis present

## 2014-08-03 DIAGNOSIS — I5023 Acute on chronic systolic (congestive) heart failure: Secondary | ICD-10-CM | POA: Diagnosis not present

## 2014-08-03 DIAGNOSIS — R05 Cough: Secondary | ICD-10-CM | POA: Diagnosis present

## 2014-08-03 DIAGNOSIS — I248 Other forms of acute ischemic heart disease: Secondary | ICD-10-CM | POA: Diagnosis present

## 2014-08-03 DIAGNOSIS — E1159 Type 2 diabetes mellitus with other circulatory complications: Secondary | ICD-10-CM | POA: Diagnosis not present

## 2014-08-03 DIAGNOSIS — J189 Pneumonia, unspecified organism: Secondary | ICD-10-CM | POA: Diagnosis present

## 2014-08-03 DIAGNOSIS — E1151 Type 2 diabetes mellitus with diabetic peripheral angiopathy without gangrene: Secondary | ICD-10-CM | POA: Diagnosis present

## 2014-08-03 DIAGNOSIS — Z951 Presence of aortocoronary bypass graft: Secondary | ICD-10-CM | POA: Diagnosis not present

## 2014-08-03 DIAGNOSIS — I739 Peripheral vascular disease, unspecified: Secondary | ICD-10-CM | POA: Diagnosis present

## 2014-08-03 DIAGNOSIS — I4891 Unspecified atrial fibrillation: Secondary | ICD-10-CM | POA: Diagnosis present

## 2014-08-03 DIAGNOSIS — J9601 Acute respiratory failure with hypoxia: Secondary | ICD-10-CM

## 2014-08-03 DIAGNOSIS — Z9119 Patient's noncompliance with other medical treatment and regimen: Secondary | ICD-10-CM | POA: Diagnosis present

## 2014-08-03 DIAGNOSIS — I2510 Atherosclerotic heart disease of native coronary artery without angina pectoris: Secondary | ICD-10-CM | POA: Diagnosis present

## 2014-08-03 DIAGNOSIS — Y95 Nosocomial condition: Secondary | ICD-10-CM | POA: Diagnosis present

## 2014-08-03 LAB — I-STAT CG4 LACTIC ACID, ED: Lactic Acid, Venous: 1.71 mmol/L (ref 0.5–2.0)

## 2014-08-03 LAB — GLUCOSE, CAPILLARY
GLUCOSE-CAPILLARY: 210 mg/dL — AB (ref 70–99)
Glucose-Capillary: 166 mg/dL — ABNORMAL HIGH (ref 70–99)
Glucose-Capillary: 200 mg/dL — ABNORMAL HIGH (ref 70–99)
Glucose-Capillary: 321 mg/dL — ABNORMAL HIGH (ref 70–99)
Glucose-Capillary: 194 mg/dL — ABNORMAL HIGH (ref 70–99)

## 2014-08-03 LAB — CULTURE, BLOOD (ROUTINE X 2)

## 2014-08-03 LAB — TROPONIN I
TROPONIN I: 0.03 ng/mL (ref <0.031)
TROPONIN I: 0.04 ng/mL — AB (ref <0.031)
Troponin I: 0.03 ng/mL (ref <0.031)

## 2014-08-03 LAB — MRSA PCR SCREENING: MRSA BY PCR: NEGATIVE

## 2014-08-03 LAB — TSH: TSH: 2.301 u[IU]/mL (ref 0.350–4.500)

## 2014-08-03 MED ORDER — DIGOXIN 0.25 MG/ML IJ SOLN
0.2500 mg | Freq: Once | INTRAMUSCULAR | Status: DC
  Filled 2014-08-03 (×2): qty 1

## 2014-08-03 MED ORDER — INSULIN ASPART 100 UNIT/ML ~~LOC~~ SOLN: 0.0000 [IU] | Freq: Every day | SUBCUTANEOUS | Status: DC

## 2014-08-03 MED ORDER — SODIUM CHLORIDE 0.9 % IV SOLN
1000.0000 mL | INTRAVENOUS | Status: DC
  Administered 2014-08-03: 1000 mL via INTRAVENOUS

## 2014-08-03 MED ORDER — INSULIN ASPART 100 UNIT/ML ~~LOC~~ SOLN
0.0000 [IU] | Freq: Three times a day (TID) | SUBCUTANEOUS | Status: DC
  Administered 2014-08-03: 3 [IU] via SUBCUTANEOUS
  Administered 2014-08-03: 7 [IU] via SUBCUTANEOUS
  Administered 2014-08-04: 3 [IU] via SUBCUTANEOUS
  Administered 2014-08-04: 2 [IU] via SUBCUTANEOUS
  Administered 2014-08-04: 3 [IU] via SUBCUTANEOUS
  Administered 2014-08-05: 2 [IU] via SUBCUTANEOUS
  Administered 2014-08-05: 3 [IU] via SUBCUTANEOUS

## 2014-08-03 MED ORDER — SPIRONOLACTONE 25 MG PO TABS
25.0000 mg | ORAL_TABLET | Freq: Every day | ORAL | Status: DC
  Administered 2014-08-03 – 2014-08-05 (×3): 25 mg via ORAL
  Filled 2014-08-03 (×3): qty 1

## 2014-08-03 MED ORDER — FUROSEMIDE 10 MG/ML IJ SOLN
40.0000 mg | Freq: Once | INTRAMUSCULAR | Status: AC
  Administered 2014-08-03: 40 mg via INTRAVENOUS
  Filled 2014-08-03: qty 4

## 2014-08-03 MED ORDER — DEXTROSE 5 % IV SOLN
1.0000 g | Freq: Once | INTRAVENOUS | Status: AC
  Administered 2014-08-03: 1 g via INTRAVENOUS
  Filled 2014-08-03: qty 1

## 2014-08-03 MED ORDER — SODIUM CHLORIDE 0.9 % IV SOLN: 250.0000 mL | INTRAVENOUS | Status: DC | PRN

## 2014-08-03 MED ORDER — DILTIAZEM HCL 100 MG IV SOLR
5.0000 mg/h | INTRAVENOUS | Status: DC
  Administered 2014-08-03 (×2): 5 mg/h via INTRAVENOUS

## 2014-08-03 MED ORDER — RIVAROXABAN 20 MG PO TABS
20.0000 mg | ORAL_TABLET | Freq: Every day | ORAL | Status: DC
  Administered 2014-08-03 – 2014-08-04 (×2): 20 mg via ORAL
  Filled 2014-08-03 (×2): qty 1

## 2014-08-03 MED ORDER — CARVEDILOL 25 MG PO TABS
25.0000 mg | ORAL_TABLET | Freq: Two times a day (BID) | ORAL | Status: DC
  Administered 2014-08-03 – 2014-08-05 (×5): 25 mg via ORAL
  Filled 2014-08-03 (×5): qty 1

## 2014-08-03 MED ORDER — ACETAMINOPHEN 650 MG RE SUPP
650.0000 mg | Freq: Four times a day (QID) | RECTAL | Status: DC | PRN
Start: 2014-08-03 — End: 2014-08-05

## 2014-08-03 MED ORDER — SODIUM CHLORIDE 0.9 % IV SOLN
1000.0000 mL | Freq: Once | INTRAVENOUS | Status: AC
  Administered 2014-08-03: 1000 mL via INTRAVENOUS

## 2014-08-03 MED ORDER — SODIUM CHLORIDE 0.9 % IJ SOLN: 3.0000 mL | Freq: Two times a day (BID) | INTRAMUSCULAR | Status: DC

## 2014-08-03 MED ORDER — LEVOFLOXACIN IN D5W 750 MG/150ML IV SOLN
750.0000 mg | INTRAVENOUS | Status: DC
  Administered 2014-08-03 – 2014-08-05 (×3): 750 mg via INTRAVENOUS
  Filled 2014-08-03 (×3): qty 150

## 2014-08-03 MED ORDER — SODIUM CHLORIDE 0.9 % IJ SOLN
3.0000 mL | Freq: Two times a day (BID) | INTRAMUSCULAR | Status: DC
  Administered 2014-08-03 – 2014-08-04 (×2): 3 mL via INTRAVENOUS

## 2014-08-03 MED ORDER — FUROSEMIDE 40 MG PO TABS
40.0000 mg | ORAL_TABLET | Freq: Every day | ORAL | Status: DC
  Administered 2014-08-03: 40 mg via ORAL
  Filled 2014-08-03: qty 1

## 2014-08-03 MED ORDER — PERFLUTREN LIPID MICROSPHERE
1.0000 mL | INTRAVENOUS | Status: AC | PRN
  Administered 2014-08-03: 2 mL via INTRAVENOUS
  Filled 2014-08-03: qty 10

## 2014-08-03 MED ORDER — VANCOMYCIN HCL 10 G IV SOLR
2000.0000 mg | Freq: Once | INTRAVENOUS | Status: AC
  Administered 2014-08-03: 2000 mg via INTRAVENOUS
  Filled 2014-08-03: qty 2000

## 2014-08-03 MED ORDER — LISINOPRIL 2.5 MG PO TABS
2.5000 mg | ORAL_TABLET | Freq: Every day | ORAL | Status: DC
  Administered 2014-08-03: 2.5 mg via ORAL
  Filled 2014-08-03: qty 1

## 2014-08-03 MED ORDER — ACETAMINOPHEN 325 MG PO TABS
650.0000 mg | ORAL_TABLET | Freq: Four times a day (QID) | ORAL | Status: DC | PRN
  Administered 2014-08-03: 650 mg via ORAL
  Filled 2014-08-03: qty 2

## 2014-08-03 MED ORDER — SODIUM CHLORIDE 0.9 % IJ SOLN: 3.0000 mL | INTRAMUSCULAR | Status: DC | PRN

## 2014-08-03 MED ORDER — VANCOMYCIN HCL IN DEXTROSE 1-5 GM/200ML-% IV SOLN
1000.0000 mg | Freq: Two times a day (BID) | INTRAVENOUS | Status: DC
  Administered 2014-08-03 – 2014-08-05 (×4): 1000 mg via INTRAVENOUS
  Filled 2014-08-03 (×7): qty 200

## 2014-08-03 MED ORDER — FUROSEMIDE 10 MG/ML IJ SOLN
40.0000 mg | Freq: Three times a day (TID) | INTRAMUSCULAR | Status: DC
  Administered 2014-08-03 – 2014-08-05 (×6): 40 mg via INTRAVENOUS
  Filled 2014-08-03 (×6): qty 4

## 2014-08-03 MED ORDER — DEXTROSE 5 % IV SOLN
1.0000 g | Freq: Three times a day (TID) | INTRAVENOUS | Status: DC
  Administered 2014-08-03 – 2014-08-05 (×7): 1 g via INTRAVENOUS
  Filled 2014-08-03 (×9): qty 1

## 2014-08-03 NOTE — Progress Notes (Signed)
UR Completed Shaquia Berkley Graves-Bigelow, RN,BSN [phone]  

## 2014-08-03 NOTE — Progress Notes (Signed)
Echocardiogram 2D Echocardiogram with Definity has been performed.  Nolon Rod 08/03/2014, 1:11 PM

## 2014-08-03 NOTE — ED Notes (Signed)
Dr. Kim at bedside   

## 2014-08-03 NOTE — Progress Notes (Signed)
ANTIBIOTIC CONSULT NOTE - INITIAL  Pharmacy Consult for Maxipime and Levaquin Indication: pneumonia and bacteremia  No Known Allergies  Patient Measurements: Height: 5\' 6"  (167.6 cm) Weight: 242 lb (109.77 kg) IBW/kg (Calculated) : 63.8  Vital Signs: Temp: 98.7 F (37.1 C) (05/11 0000) Temp Source: Oral (05/11 0000) BP: 119/77 mmHg (05/11 0248) Pulse Rate: 115 (05/11 0248)  Labs:  Recent Labs  08/01/14 1453 08/01/14 1505 08/02/14 2146  WBC 7.4  --  8.3  HGB 9.4* 11.9* 10.6*  PLT 187  --  233  CREATININE 1.17 1.10 1.40*   Estimated Creatinine Clearance: 67.7 mL/min (by C-G formula based on Cr of 1.4).   Microbiology: Recent Results (from the past 720 hour(s))  Blood Culture (routine x 2)     Status: None (Preliminary result)   Collection Time: 08/01/14  3:54 PM  Result Value Ref Range Status   Specimen Description BLOOD LEFT WRIST  Final   Special Requests BOTTLES DRAWN AEROBIC AND ANAEROBIC 5CC  Final   Culture   Final           BLOOD CULTURE RECEIVED NO GROWTH TO DATE CULTURE WILL BE HELD FOR 5 DAYS BEFORE ISSUING A FINAL NEGATIVE REPORT Performed at [facility]    Report Status PENDING  Incomplete  Blood Culture (routine x 2)     Status: None (Preliminary result)   Collection Time: 08/01/14  3:56 PM  Result Value Ref Range Status   Specimen Description BLOOD RIGHT WRIST  Final   Special Requests BOTTLES DRAWN AEROBIC AND ANAEROBIC 5CC  Final   Culture   Final    GRAM POSITIVE COCCI IN CLUSTERS Note: Gram Stain Report Called to,Read Back By and Verified With: LYNN MILLER 08/02/14 @ 1543 BY PARDA. Performed at [facility]    Report Status PENDING  Incomplete  Urine culture     Status: None   Collection Time: 08/01/14  4:48 PM  Result Value Ref Range Status   Specimen Description URINE, RANDOM  Final   Special Requests NONE  Final   Colony Count NO GROWTH Performed at [facility]   Final   Culture NO GROWTH Performed at [facility]   Final   Report Status 08/02/2014 FINAL  Final    Medical History: Past Medical History  Diagnosis Date  . Hypertension   . Systolic heart failure     EF is 40-45% by echo, December 2013  . Noncompliance   . CAD (coronary artery disease) Sept 2013    s/p cardiac cath showing occlusion of small RCA with collaterals  . Chronic anticoagulation     on coumadin  . Atrial fibrillation   . Peripheral arterial disease   . Automatic implantable cardioverter-defibrillator in situ   . High cholesterol   . CHF (congestive heart failure)   . Heart murmur   . Myocardial infarction 2014  . Type II diabetes mellitus     Medications:  Prescriptions prior to admission  Medication Sig Dispense Refill Last Dose  . carvedilol (COREG) 25 MG tablet Take 1 tablet (25 mg total) by mouth 2 (two) times daily. 60 tablet 0 Past Week at Unknown time  . furosemide (LASIX) 40 MG tablet Take 1 tablet (40 mg total) by mouth daily. 30 tablet 0 Past Week at Unknown time  . glipiZIDE (GLUCOTROL) 5 MG tablet Take 1 tablet (5 mg total) by mouth 2 (two) times daily before a meal. 60 tablet 0 Past Week at Unknown time  .  levofloxacin (LEVAQUIN) 500 MG tablet Take 1 tablet (500 mg total) by mouth daily. 7 tablet 0 08/02/2014 at Unknown time  . lisinopril (ZESTRIL) 2.5 MG tablet Take 1 tablet (2.5 mg total) by mouth daily. 30 tablet 0 Past Week at Unknown time  . rivaroxaban (XARELTO) 20 MG TABS tablet Take 1 tablet (20 mg total) by mouth daily before supper. 30 tablet 6 Past Week at Unknown time  . spironolactone (ALDACTONE) 25 MG tablet Take 1 tablet (25 mg total) by mouth daily. 30 tablet 6 Past Week at Unknown time   Scheduled:  . carvedilol  25 mg Oral BID WC  . digoxin  0.25 mg Intravenous Once  . furosemide  40 mg Intravenous Once  . furosemide  40 mg Oral Daily  . insulin aspart  0-5 Units Subcutaneous QHS  . insulin aspart  0-9 Units Subcutaneous TID WC  . lisinopril  2.5 mg Oral Daily  .  rivaroxaban  20 mg Oral QAC supper  . sodium chloride  3 mL Intravenous Q12H  . sodium chloride  3 mL Intravenous Q12H  . spironolactone  25 mg Oral Daily  . vancomycin  2,000 mg Intravenous Once  . vancomycin  1,000 mg Intravenous Q12H   Infusions:  . sodium chloride Stopped (08/03/14 0258)    Assessment: 58yo male left AMA yesterday after CXR showed PNA vs atelectasis, now returns after blood cx was positive, started on vanc, now to broaden coverage.   Plan:  Rec'd cefepime 1g in ED; will continue with cefepime 1g IV Q8H and Levaquin  IV Q24H and monitor CBC and Cx.  Vernard Gambles, PharmD, BCPS  08/03/2014,4:21 AM

## 2014-08-03 NOTE — Care Management Note (Signed)
Case Management Note  Patient Details  Name: Jacob [name] MRN: [id number] Date of Birth: Sep 08, 1955  Subjective/Objective:       Pt admitted for Afib RVR, Treating for HCAP with IV antibiotics.              Action/Plan: No needs identified by CM at this time. Will continue to monitor.    Expected Discharge Date:                  Expected Discharge Plan:  Home/Self Care  In-House Referral:     Discharge planning Services  CM Consult  Post Acute Care Choice:    Choice offered to:     DME Arranged:    DME Agency:     HH Arranged:    HH Agency:     Status of Service:     Medicare Important Message Given:    Date Medicare IM Given:    Medicare IM give by:    Date Additional Medicare IM Given:    Additional Medicare Important Message give by:     If discussed at Long Length of Stay Meetings, dates discussed:    Additional Comments:  Gala Lewandowsky, RN 08/03/2014, 2:34 PM

## 2014-08-03 NOTE — Progress Notes (Signed)
[facility] PROGRESS NOTE  Daymien Cutchin MYT:[id number] DOB: 20-Mar-1956 DOA: 08/03/2014 PCP: [name], MD  Assessment/Plan: Principal Problem CAP (community acquired pneumonia) -presented with SOB and green sputum production -XRAY consolidation found at right lung base -gram + cocci found in blood 1/2 culture. -Per pharmacy, IV Levaquin vancomycin and Cefepime. -Monitor CBC and renal function  Acute on chronic systolic CHF -LVEF of 20-25% on 04/06/13, 2+ pitting edema in LE -CXR showed cardiomegaly  -Pt has not likely been compliant with medicine regimen, and stress from pneumonia has also caused chf exacerbation -Diurese with Lasix and Spironolactone  -Monitor electrolytes and creatinine   Active Problems:   DM (diabetes mellitus), type 2 with peripheral vascular complications -BG moderately controlled -SSI-Novolog -Follow     Atrial fibrillation with RVR -Patient is stable -EKG shows atrial fibrillation -Anticoagulated with Xarelto, Rate controlled with Coreg, also on Diltiazem drip -Monitor  Anemia  -Hemoglobin 10.6 on 08/02/14 -CBC to monitor   Acute respiratory failure Oxygen saturation was 87% on admission, likely secondary to pneumonia and acute CHF. Continue supplemental oxygen to keep oxygen saturation above 91%, treat above mentioned conditions.  Code Status: Full code  Family Communication: None present. Plan discussed with patient  Disposition Plan: Remain inpatient. Monitor progression of pneumonia treatment likely discharge 1-2 days.    Consultants:  Pharmacy   Procedures:  Echo  Antibiotics: cefepime 1g IV Q8H and Levaquin 750mg  IV Q24H. First dose 08/03/14.  HPI/Subjective: Patient is alert and oriented in visible distress.  He is worried about his condition.   Objective: Filed Vitals:   08/03/14 1134  BP: 121/80  Pulse: 96  Temp: 97.7 F (36.5 C)  Resp: 20    Intake/Output Summary (Last 24 hours) at 08/03/14 1245 Last  data filed at 08/03/14 1100  Gross per 24 hour  Intake  459.5 ml  Output   2175 ml  Net -1715.5 ml   Filed Weights   08/02/14 2142  Weight: 109.77 kg (242 lb)    Exam:   General:  Patient is sitting up in bed in visible emotional distress worried about his condition. Stated he just wanted to go home.  Cardiovascular: AFIB. No gallops or friction rubs. No murmurs heard.  Respiratory: Some wheezing.  BL air movement.   Abdomen: Soft. Non tender.  Normoactive bowel sounds  Musculoskeletal: 2+ pitting edema in LE.  No visual deformities. Warm to touch.   Data Reviewed: Basic Metabolic Panel:  Recent Labs Lab 08/01/14 1453 08/01/14 1505 08/02/14 2146  NA 132* 134* 133*  K 3.9 3.9 3.8  CL 98* 94* 96*  CO2 27  --  25  GLUCOSE 372* 395* 267*  BUN 18 20 23*  CREATININE 1.17 1.10 1.40*  CALCIUM 8.0*  --  8.5*   Liver Function Tests:  Recent Labs Lab 08/01/14 1453 08/02/14 2146  AST 15 19  ALT 11* 12*  ALKPHOS 102 124  BILITOT 1.0 1.3*  PROT 5.8* 6.8  ALBUMIN 2.7* 3.2*   No results for input(s): LIPASE, AMYLASE in the last 168 hours. No results for input(s): AMMONIA in the last 168 hours. CBC:  Recent Labs Lab 08/01/14 1453 08/01/14 1505 08/02/14 2146  WBC 7.4  --  8.3  NEUTROABS  --   --  5.5  HGB 9.4* 11.9* 10.6*  HCT 33.1* 35.0* 37.3*  MCV 72.1*  --  71.6*  PLT 187  --  233   Cardiac Enzymes:  Recent Labs Lab 08/03/14 0520 08/03/14 1048  TROPONINI 0.03 0.03  BNP (last 3 results)  Recent Labs  08/01/14 1453  BNP 960.6*    ProBNP (last 3 results) No results for input(s): PROBNP in the last 8760 hours.  CBG:  Recent Labs Lab 08/01/14 1625 08/01/14 1736 08/03/14 0405 08/03/14 0730 08/03/14 1136  GLUCAP 220* 83 166* 200* 210*    Recent Results (from the past 240 hour(s))  Blood Culture (routine x 2)     Status: None (Preliminary result)   Collection Time: 08/01/14  3:54 PM  Result Value Ref Range Status   Specimen  Description BLOOD LEFT WRIST  Final   Special Requests BOTTLES DRAWN AEROBIC AND ANAEROBIC 5CC  Final   Culture   Final           BLOOD CULTURE RECEIVED NO GROWTH TO DATE CULTURE WILL BE HELD FOR 5 DAYS BEFORE ISSUING A FINAL NEGATIVE REPORT Performed at [facility]    Report Status PENDING  Incomplete  Blood Culture (routine x 2)     Status: None   Collection Time: 08/01/14  3:56 PM  Result Value Ref Range Status   Specimen Description BLOOD RIGHT WRIST  Final   Special Requests BOTTLES DRAWN AEROBIC AND ANAEROBIC 5CC  Final   Culture   Final    STAPHYLOCOCCUS SPECIES (COAGULASE NEGATIVE) Note: THE SIGNIFICANCE OF ISOLATING THIS ORGANISM FROM A SINGLE SET OF BLOOD CULTURES WHEN MULTIPLE SETS ARE DRAWN IS UNCERTAIN. PLEASE NOTIFY THE MICROBIOLOGY DEPARTMENT WITHIN ONE WEEK IF SPECIATION AND SENSITIVITIES ARE REQUIRED. Note: Gram Stain Report Called to,Read Back By and Verified With: LYNN MILLER 08/02/14 @ 1543 BY PARDA. Performed at [facility]    Report Status 08/03/2014 FINAL  Final  Urine culture     Status: None   Collection Time: 08/01/14  4:48 PM  Result Value Ref Range Status   Specimen Description URINE, RANDOM  Final   Special Requests NONE  Final   Colony Count NO GROWTH Performed at [facility]   Final   Culture NO GROWTH Performed at [facility]   Final   Report Status 08/02/2014 FINAL  Final  MRSA PCR Screening     Status: Abnormal   Collection Time: 08/03/14  4:50 AM  Result Value Ref Range Status   MRSA by PCR SUBOPTIMAL SPECIMEN (A) NEGATIVE Final    Comment: NOTIFIED CHRIS,RN AT 0521 ON [id number] SKEEN,P SPECIMEN CREDITED   MRSA PCR Screening     Status: None   Collection Time: 08/03/14  5:45 AM  Result Value Ref Range Status   MRSA by PCR NEGATIVE NEGATIVE Final    Comment:        The GeneXpert MRSA Assay (FDA approved for NASAL specimens only), is one component of a comprehensive MRSA colonization surveillance program.  It is not intended to diagnose MRSA infection nor to guide or monitor treatment for MRSA infections.      Studies: Dg Chest Port 1 View  08/01/2014   CLINICAL DATA:  Left chest pain.  Productive cough.  EXAM: PORTABLE CHEST - 1 VIEW  COMPARISON:  05/12/2013.  FINDINGS: Stable enlarged cardiac silhouette and median sternotomy wires. Small right pleural effusion without significant change. Small amount of adjacent airspace opacity with progression. Diffusely prominent pulmonary vasculature and interstitial markings with improvement. Stable left subclavian pacemaker. The distal portion of the lead is not clearly visualized due to overlapping soft tissues. Unremarkable bones.  IMPRESSION: 1. Stable small right pleural effusion. 2. Mild increase in patchy opacity at the right lung base.  This could represent atelectasis or pneumonia. 3. Stable cardiomegaly with improved changes of congestive heart failure.   Electronically Signed   By: Beckie Salts M.D.   On: 08/01/2014 15:23    Scheduled Meds: . carvedilol  25 mg Oral BID WC  . ceFEPime (MAXIPIME) IV  1 g Intravenous 3 times per day  . digoxin  0.25 mg Intravenous Once  . furosemide  40 mg Oral Daily  . insulin aspart  0-5 Units Subcutaneous QHS  . insulin aspart  0-9 Units Subcutaneous TID WC  . levofloxacin (LEVAQUIN) IV  750 mg Intravenous Q24H  . lisinopril  2.5 mg Oral Daily  . rivaroxaban  20 mg Oral QAC supper  . sodium chloride  3 mL Intravenous Q12H  . sodium chloride  3 mL Intravenous Q12H  . spironolactone  25 mg Oral Daily  . vancomycin  1,000 mg Intravenous Q12H   Continuous Infusions: . diltiazem (CARDIZEM) infusion 5 mg/hr (08/03/14 0810)       Time spent: 40 minutes    Latrelle Dodrill [facility] PA-S [facility] Pager [phone]. If 7PM-7AM, please contact night-coverage at [email or web address], password [email or web address] 08/03/2014, 12:45 PM  LOS: 0 days      Addendum  Patient seen and examined, chart and data base  reviewed.  I agree with the above assessment and plan.  For full details please see Mr. Latrelle Dodrill [place] PA-S note.  I reviewed and amended the above note as appropriate.   Clint Lipps, MD [facility] Pager: [phone] 08/03/2014, 3:05 PM

## 2014-08-03 NOTE — ED Notes (Signed)
PA at bedside.

## 2014-08-03 NOTE — ED Provider Notes (Signed)
CSN: [id number]     Arrival date & time 08/02/14  2133 History   First MD Initiated Contact with Patient 08/03/14 0124     Chief Complaint  Patient presents with  . Abnormal Lab     (Consider location/radiation/quality/duration/timing/severity/associated sxs/prior Treatment) HPI Comments: 59 year old male with a history of systolic heart failure with LVEF of 25%, CAD s/p CABG, PAD, and multiple admissions for atrial fibrillation, health care associated pneumonia, and congestive heart failure presents to the emergency department this evening after being notified that his blood cultures were positive. Blood cultures obtained during encounter yesterday. Patient presented at this time complaining of 4 days of increasing cough, peripheral edema, lower extremity swelling, and weight gain. Patient reported orthopnea with an inability to lie flat. Yesterday, paramedics noted the patient to be in atrial fibrillation with RVR and mild hypotension. Patient states to me that he has also developed a new, congested sounding cough. He denies any fever or chills. Chest x-ray performed yesterday showed a patchy opacity most consistent with pneumonia. He was accepted for admission for HCAP, but desired discharge from the hospital. He denies any worsening in his symptoms since his discharge approximately 30 hours ago.  The history is provided by the patient. No language interpreter was used.    Past Medical History  Diagnosis Date  . Hypertension   . Systolic heart failure     EF is 40-45% by echo, December 2013  . Noncompliance   . CAD (coronary artery disease) Sept 2013    s/p cardiac cath showing occlusion of small RCA with collaterals  . Chronic anticoagulation     on coumadin  . Atrial fibrillation   . Peripheral arterial disease   . Automatic implantable cardioverter-defibrillator in situ   . High cholesterol   . CHF (congestive heart failure)   . Heart murmur   . Myocardial infarction 2014  .  Type II diabetes mellitus    Past Surgical History  Procedure Laterality Date  . Implantable cardioverter defibrillator implant      [place] in 07/2012; [employer]  . Coronary artery bypass graft  09/2012    2 vessels per patient [name]   . Coronary angioplasty with stent placement  11/2011    "1"  . Cardiac catheterization  09/2012  . Iliac artery stent Right 08/30/2013  . Left and right heart catheterization with coronary angiogram N/A 12/02/2011    Procedure: LEFT AND RIGHT HEART CATHETERIZATION WITH CORONARY ANGIOGRAM;  Surgeon: Kathleene Hazel, MD;  Location: [facility] CATH LAB;  Service: Cardiovascular;  Laterality: N/A;  . Lower extremity angiogram N/A 08/30/2013    Procedure: LOWER EXTREMITY ANGIOGRAM;  Surgeon: Runell Gess, MD;  Location: [facility] CATH LAB;  Service: Cardiovascular;  Laterality: N/A;  . Lower extremity angiogram N/A 12/02/2013    Procedure: LOWER EXTREMITY ANGIOGRAM;  Surgeon: Runell Gess, MD;  Location: [facility] CATH LAB;  Service: Cardiovascular;  Laterality: N/A;   Family History  Problem Relation Age of Onset  . Diabetes Mother    History  Substance Use Topics  . Smoking status: Current Every Day Smoker -- 0.25 packs/day for 38 years    Types: Cigarettes  . Smokeless tobacco: Never Used  . Alcohol Use: No    Review of Systems  Constitutional: Negative for fever and chills.  Respiratory: Positive for cough and shortness of breath.   Cardiovascular: Positive for leg swelling. Negative for chest pain.  Gastrointestinal: Negative for vomiting and diarrhea.  All other systems reviewed and are  negative.   Allergies  Review of patient's allergies indicates no known allergies.  Home Medications   Prior to Admission medications   Medication Sig Start Date End Date Taking? Authorizing Provider  carvedilol (COREG) 25 MG tablet Take 1 tablet (25 mg total) by mouth 2 (two) times daily. 04/12/14   [name], MD  furosemide (LASIX) 40 MG tablet Take  1 tablet (40 mg total) by mouth daily. 04/12/14   [name], MD  glipiZIDE (GLUCOTROL) 5 MG tablet Take 1 tablet (5 mg total) by mouth 2 (two) times daily before a meal. 04/12/14   [name], MD  levofloxacin (LEVAQUIN) 500 MG tablet Take 1 tablet (500 mg total) by mouth daily. 08/01/14   Alison Murray, MD  lisinopril (ZESTRIL) 2.5 MG tablet Take 1 tablet (2.5 mg total) by mouth daily. 04/12/14   [name], MD  rivaroxaban (XARELTO) 20 MG TABS tablet Take 1 tablet (20 mg total) by mouth daily before supper. 04/12/14   [name], MD  spironolactone (ALDACTONE) 25 MG tablet Take 1 tablet (25 mg total) by mouth daily. 04/12/14   Olugbemiga E Hyman Hopes, MD   BP 122/90 mmHg  Pulse 59  Temp(Src) 98.6 F (37 C) (Oral)  Resp 18  Wt 242 lb (109.77 kg)  SpO2 97%   Physical Exam  Constitutional: He is oriented to person, place, and time. He appears well-developed and well-nourished. No distress.  Nontoxic/nonseptic appearing. Pleasant.  HENT:  Head: Normocephalic and atraumatic.  Mouth/Throat: Oropharynx is clear and moist. No oropharyngeal exudate.  Eyes: Conjunctivae and EOM are normal. No scleral icterus.  Neck: Normal range of motion.  Cardiovascular: Normal rate, regular rhythm and intact distal pulses.   Pulmonary/Chest: Effort normal. No respiratory distress. He has no wheezes.  No tachypnea or dyspnea. Chest expansion symmetric. No wheezes or rales appreciated on my examination.  Musculoskeletal: Normal range of motion. He exhibits edema (BLE).  Neurological: He is alert and oriented to person, place, and time. He exhibits normal muscle tone. Coordination normal.  Skin: Skin is warm and dry. No rash noted. He is not diaphoretic. No erythema. No pallor.  Psychiatric: He has a normal mood and affect. His behavior is normal.  Nursing note and vitals reviewed.   ED Course  Procedures (including critical care time) Labs Review Labs Reviewed  CBC WITH  DIFFERENTIAL/PLATELET - Abnormal; Notable for the following:    Hemoglobin 10.6 (*)    HCT 37.3 (*)    MCV 71.6 (*)    MCH 20.3 (*)    MCHC 28.4 (*)    RDW 20.4 (*)    All other components within normal limits  COMPREHENSIVE METABOLIC PANEL - Abnormal; Notable for the following:    Sodium 133 (*)    Chloride 96 (*)    Glucose, Bld 267 (*)    BUN 23 (*)    Creatinine, Ser 1.40 (*)    Calcium 8.5 (*)    Albumin 3.2 (*)    ALT 12 (*)    Total Bilirubin 1.3 (*)    GFR calc non Af Amer 54 (*)    All other components within normal limits  I-STAT CG4 LACTIC ACID, ED  I-STAT CG4 LACTIC ACID, ED    Imaging Review Dg Chest Port 1 View  08/01/2014   CLINICAL DATA:  Left chest pain.  Productive cough.  EXAM: PORTABLE CHEST - 1 VIEW  COMPARISON:  05/12/2013.  FINDINGS: Stable enlarged cardiac silhouette and median sternotomy  wires. Small right pleural effusion without significant change. Small amount of adjacent airspace opacity with progression. Diffusely prominent pulmonary vasculature and interstitial markings with improvement. Stable left subclavian pacemaker. The distal portion of the lead is not clearly visualized due to overlapping soft tissues. Unremarkable bones.  IMPRESSION: 1. Stable small right pleural effusion. 2. Mild increase in patchy opacity at the right lung base. This could represent atelectasis or pneumonia. 3. Stable cardiomegaly with improved changes of congestive heart failure.   Electronically Signed   By: Beckie Salts M.D.   On: 08/01/2014 15:23     EKG Interpretation None      CRITICAL CARE Performed by: Antony Madura   Total critical care time: 31  Critical care time was exclusive of separately billable procedures and treating other patients.  Critical care was necessary to treat or prevent imminent or life-threatening deterioration.  Critical care was time spent personally by me on the following activities: development of treatment plan with patient and/or  surrogate as well as nursing, discussions with consultants, evaluation of patient's response to treatment, examination of patient, obtaining history from patient or surrogate, ordering and performing treatments and interventions, ordering and review of laboratory studies, ordering and review of radiographic studies, pulse oximetry and re-evaluation of patient's condition.  MDM   Final diagnoses:  CAP (community acquired pneumonia)  Bacteremia    59 year old male presents to the emergency department for further evaluation after 1/2 blood cultures was positive for gram-positive cocci in clusters. Patient thought to have a pneumonia based off his emergency department workup on 08/01/2014; however, he opted to leave the hospital prior to transfer to the admitting floor. Patient afebrile and without leukocytosis. He denies any new or worsening symptoms since his discharge. Patient to be admitted under observation. Case discussed with Dr. Selena Batten of [facility] who will admit. Temp admit orders placed.   Filed Vitals:   08/02/14 2142 08/03/14 0144  BP: 111/51 122/90  Pulse: 70 59  Temp: 98.6 F (37 C)   TempSrc: Oral   Resp: 16 18  Weight: 242 lb (109.77 kg)   SpO2: 94% 97%       Antony Madura, PA-C 08/03/14 9147  Marisa Severin, MD 08/03/14 0600

## 2014-08-03 NOTE — H&P (Addendum)
Jacob [name] is an 59 y.o. male.    Pt unassigned  Chief Complaint: + blood culture HPI: 59 yo male with htn, CHF (EF 20-25%), Pafib (CHADS2=5), apparently c/o cough (dry), dyspnea, and CXR yesterday showed possible pneumonia vs atelectasis.  Pt denies fever, chills, cp, palp, n/v, diarrhea, dysuria, hematuria, but blood culture 1/2 => gram + cocci.  Pt apparently left AMA yesterday, and now returns, ED requests admission for possible hcap, and also afib with rvr.   Past Medical History  Diagnosis Date  . Hypertension   . Systolic heart failure     EF is 40-45% by echo, December 2013  . Noncompliance   . CAD (coronary artery disease) Sept 2013    s/p cardiac cath showing occlusion of small RCA with collaterals  . Chronic anticoagulation     on coumadin  . Atrial fibrillation   . Peripheral arterial disease   . Automatic implantable cardioverter-defibrillator in situ   . High cholesterol   . CHF (congestive heart failure)   . Heart murmur   . Myocardial infarction 2014  . Type II diabetes mellitus     Past Surgical History  Procedure Laterality Date  . Implantable cardioverter defibrillator implant      [place] in 07/2012; [employer]  . Coronary artery bypass graft  09/2012    2 vessels per patient Jacob [name])   . Coronary angioplasty with stent placement  11/2011    "1"  . Cardiac catheterization  09/2012  . Iliac artery stent Right 08/30/2013  . Left and right heart catheterization with coronary angiogram N/A 12/02/2011    Procedure: LEFT AND RIGHT HEART CATHETERIZATION WITH CORONARY ANGIOGRAM;  Surgeon: Burnell Blanks, MD;  Location: [facility] CATH LAB;  Service: Cardiovascular;  Laterality: N/A;  . Lower extremity angiogram N/A 08/30/2013    Procedure: LOWER EXTREMITY ANGIOGRAM;  Surgeon: Lorretta Harp, MD;  Location: [facility] CATH LAB;  Service: Cardiovascular;  Laterality: N/A;  . Lower extremity angiogram N/A 12/02/2013    Procedure: LOWER EXTREMITY ANGIOGRAM;  Surgeon: Lorretta Harp, MD;  Location: [facility] CATH LAB;  Service: Cardiovascular;  Laterality: N/A;    Family History  Problem Relation Age of Onset  . Diabetes Mother    Social History:  reports that he has been smoking Cigarettes.  He has a 9.5 pack-year smoking history. He has never used smokeless tobacco. He reports that he does not drink alcohol or use illicit drugs.  Allergies: No Known Allergies Medications reviewed  (Not in a hospital admission)  Results for orders placed or performed during the hospital encounter of 08/03/14 (from the past 48 hour(s))  CBC with Differential     Status: Abnormal   Collection Time: 08/02/14  9:46 PM  Result Value Ref Range   WBC 8.3 4.0 - 10.5 K/uL   RBC 5.21 4.22 - 5.81 MIL/uL   Hemoglobin 10.6 (L) 13.0 - 17.0 g/dL   HCT 37.3 (L) 39.0 - 52.0 %   MCV 71.6 (L) 78.0 - 100.0 fL   MCH 20.3 (L) 26.0 - 34.0 pg   MCHC 28.4 (L) 30.0 - 36.0 g/dL   RDW 20.4 (H) 11.5 - 15.5 %   Platelets 233 150 - 400 K/uL   Neutrophils Relative % 67 43 - 77 %   Neutro Abs 5.5 1.7 - 7.7 K/uL   Lymphocytes Relative 20 12 - 46 %   Lymphs Abs 1.6 0.7 - 4.0 K/uL   Monocytes Relative 10 3 - 12 %   Monocytes  Absolute 0.9 0.1 - 1.0 K/uL   Eosinophils Relative 2 0 - 5 %   Eosinophils Absolute 0.2 0.0 - 0.7 K/uL   Basophils Relative 1 0 - 1 %   Basophils Absolute 0.1 0.0 - 0.1 K/uL  Comprehensive metabolic panel     Status: Abnormal   Collection Time: 08/02/14  9:46 PM  Result Value Ref Range   Sodium 133 (L) 135 - 145 mmol/L   Potassium 3.8 3.5 - 5.1 mmol/L   Chloride 96 (L) 101 - 111 mmol/L   CO2 25 22 - 32 mmol/L   Glucose, Bld 267 (H) 70 - 99 mg/dL   BUN 23 (H) 6 - 20 mg/dL   Creatinine, Ser 1.40 (H) 0.61 - 1.24 mg/dL   Calcium 8.5 (L) 8.9 - 10.3 mg/dL   Total Protein 6.8 6.5 - 8.1 g/dL   Albumin 3.2 (L) 3.5 - 5.0 g/dL   AST 19 15 - 41 U/L   ALT 12 (L) 17 - 63 U/L   Alkaline Phosphatase 124 38 - 126 U/L   Total Bilirubin 1.3 (H) 0.3 - 1.2 mg/dL   GFR calc non Af Amer 54 (L)  >60 mL/min   GFR calc Af Amer >60 >60 mL/min    Comment: (NOTE) The eGFR has been calculated using the CKD EPI equation. This calculation has not been validated in all clinical situations. eGFR's persistently <60 mL/min signify possible Chronic Kidney Disease.    Anion gap 12 5 - 15  I-Stat CG4 Lactic Acid, ED     Status: None   Collection Time: 08/02/14  9:53 PM  Result Value Ref Range   Lactic Acid, Venous 1.99 0.5 - 2.0 mmol/L  I-Stat CG4 Lactic Acid, ED     Status: None   Collection Time: 08/03/14  2:05 AM  Result Value Ref Range   Lactic Acid, Venous 1.71 0.5 - 2.0 mmol/L   Dg Chest Port 1 View  08/01/2014   CLINICAL DATA:  Left chest pain.  Productive cough.  EXAM: PORTABLE CHEST - 1 VIEW  COMPARISON:  05/12/2013.  FINDINGS: Stable enlarged cardiac silhouette and median sternotomy wires. Small right pleural effusion without significant change. Small amount of adjacent airspace opacity with progression. Diffusely prominent pulmonary vasculature and interstitial markings with improvement. Stable left subclavian pacemaker. The distal portion of the lead is not clearly visualized due to overlapping soft tissues. Unremarkable bones.  IMPRESSION: 1. Stable small right pleural effusion. 2. Mild increase in patchy opacity at the right lung base. This could represent atelectasis or pneumonia. 3. Stable cardiomegaly with improved changes of congestive heart failure.   Electronically Signed   By: Claudie Revering M.D.   On: 08/01/2014 15:23    Review of Systems  Constitutional: Negative.   HENT: Negative.   Eyes: Negative.   Respiratory: Positive for cough and shortness of breath. Negative for hemoptysis and sputum production.   Cardiovascular: Negative for chest pain, palpitations, orthopnea, claudication, leg swelling and PND.  Gastrointestinal: Negative.   Genitourinary: Negative.   Musculoskeletal: Negative.   Skin: Negative.   Neurological: Negative.   Endo/Heme/Allergies: Negative.    Psychiatric/Behavioral: Negative.     Blood pressure 122/90, pulse 59, temperature 98.6 F (37 C), temperature source Oral, resp. rate 18, weight 109.77 kg (242 lb), SpO2 97 %. Physical Exam  Constitutional: He is oriented to person, place, and time. He appears well-developed and well-nourished.  HENT:  Head: Normocephalic and atraumatic.  Mouth/Throat: No oropharyngeal exudate.  Eyes: Conjunctivae and EOM are  normal. Pupils are equal, round, and reactive to light. No scleral icterus.  Neck: Normal range of motion. Neck supple. No JVD present. No tracheal deviation present. No thyromegaly present.  Cardiovascular: Exam reveals no gallop and no friction rub.   No murmur heard. Irr, irr, s1, s2  Respiratory: No respiratory distress. He has no wheezes. He has rales. He exhibits no tenderness.  GI: Soft. Bowel sounds are normal. He exhibits no distension. There is no tenderness. There is no rebound and no guarding.  Musculoskeletal: Normal range of motion. He exhibits no edema or tenderness.  Lymphadenopathy:    He has no cervical adenopathy.  Neurological: He is alert and oriented to person, place, and time. He has normal reflexes. He displays normal reflexes. No cranial nerve deficit. He exhibits normal muscle tone. Coordination normal.  Skin: Skin is warm and dry. No rash noted. No erythema. No pallor.  Psychiatric: He has a normal mood and affect. His behavior is normal. Judgment and thought content normal.     Assessment/Plan Hcap with blood culture + for gram positive cocci Sputum culture if possible vanco iv, cefepime iv, levaquin iv  Afib with rvr,  Digoxin 0.$RemoveBe'25mg'vKfIxGTar$  iv x1 If not improving then metoprolol 2.$RemoveBeforeDEI'5mg'bmoamPwfMnYWHvhE$  iv x1 Trop i q6h x3 Check tsh Check cardiac echo Cont xarelto Cont carvedilol  Bacteremia Possibly from hcap  hcap vanco iv pharmacy to dose, cefepime iv pharmacy to dose, levaquin iv pharmacy to dose Blood cx pending Sputum cx pending  CHF (EF 20-25%) Cont  lasix  Pafib  Cont xarelto  Dm2 fsbs qc and qhs, iss Stop glipizide  Anemia Repeat cbc in am  DVT prophylaxis: scd, xarelto  Code status Full Code  Critical care 40 minutes  Janin Kozlowski 08/03/2014, 2:49 AM

## 2014-08-03 NOTE — Progress Notes (Signed)
ANTIBIOTIC CONSULT NOTE - INITIAL  Pharmacy Consult for Vancomycin  Indication: rule out pneumonia, positive blood cultures  No Known Allergies  Patient Measurements: Weight: 242 lb (109.77 kg)  Vital Signs: Temp: 98.6 F (37 C) (05/10 2142) Temp Source: Oral (05/10 2142) BP: 122/90 mmHg (05/11 0144) Pulse Rate: 59 (05/11 0144)  Labs:  Recent Labs  08/01/14 1453 08/01/14 1505 08/02/14 2146  WBC 7.4  --  8.3  HGB 9.4* 11.9* 10.6*  PLT 187  --  233  CREATININE 1.17 1.10 1.40*    Medical History: Past Medical History  Diagnosis Date  . Hypertension   . Systolic heart failure     EF is 40-45% by echo, December 2013  . Noncompliance   . CAD (coronary artery disease) Sept 2013    s/p cardiac cath showing occlusion of small RCA with collaterals  . Chronic anticoagulation     on coumadin  . Atrial fibrillation   . Peripheral arterial disease   . Automatic implantable cardioverter-defibrillator in situ   . High cholesterol   . CHF (congestive heart failure)   . Heart murmur   . Myocardial infarction 2014  . Type II diabetes mellitus     Assessment: 59 y/o M with recent ED visit on 5/9, CXR with possible PNA, blood cultures from 5/9 showing gram positive cocci in clusters, acute renal failure with Scr 1.40 (was 1.1 on 5/9), WBC WNL, other labs as above.   Goal of Therapy:  Vancomycin trough level 15-20 mcg/ml  Plan:  -Vancomycin 2000 mg IV x 1, then 1000 mg IV q12h -F/U additional gram negative coverage (Cefepime x 1 ordered) -Trend WBC, temp, renal function (may need dose decrease if worsens) -Drug levels as indicated  Abran Duke 08/03/2014,1:53 AM

## 2014-08-03 NOTE — Progress Notes (Signed)
[facility] PROGRESS NOTE   Jacob [name] HVF:[id number] DOB: [birth year] DOA: 08/03/2014 PCP: [name], MD  HPI/Subjective: Reported that he is very tired, initially did not want stay in the hospital. "I am more tired here in the hospital, they want to get home"  Assessment/Plan: Active Problems:   DM (diabetes mellitus), type 2 with peripheral vascular complications   CAP (community acquired pneumonia)   Atrial fibrillation with RVR   This is a no charge note, seen earlier today by my colleague Dr. Selena Batten. Patient admitted with acute respiratory failure, oxygen saturation was 87% on room air. Currently on 2 L of oxygen. Respiratory failure is likely secondary to pneumonia and acute on chronic CHF. Severe chronic CHF with LVEF of 25-30%. Patient also is diabetic, presented with atrial fibrillation with RVR. Currently on Cardizem drip and diuresis, if not improving quickly will ask cardiology to see him.  Code Status: Full Code Family Communication: Plan discussed with the patient. Disposition Plan: Remains inpatient Diet: Diet heart healthy/carb modified Room service appropriate?: Yes; Fluid consistency:: Thin  Consultants:  None  Procedures:  None  Antibiotics:  None   Objective: Filed Vitals:   08/03/14 1134  BP: 121/80  Pulse: 96  Temp: 97.7 F (36.5 C)  Resp: 20    Intake/Output Summary (Last 24 hours) at 08/03/14 1259 Last data filed at 08/03/14 1100  Gross per 24 hour  Intake  459.5 ml  Output   2175 ml  Net -1715.5 ml   Filed Weights   08/02/14 2142  Weight: 109.77 kg (242 lb)    Exam: General: Alert and awake, oriented x3, not in any acute distress. HEENT: anicteric sclera, pupils reactive to light and accommodation, EOMI CVS: S1-S2 clear, no murmur rubs or gallops Chest: clear to auscultation bilaterally, no wheezing, rales or rhonchi Abdomen: soft nontender, nondistended, normal bowel sounds, no organomegaly Extremities: +2  lower extremity edema Neuro: Cranial nerves II-XII intact, no focal neurological deficits  Data Reviewed: Basic Metabolic Panel:  Recent Labs Lab 08/01/14 1453 08/01/14 1505 08/02/14 2146  NA 132* 134* 133*  K 3.9 3.9 3.8  CL 98* 94* 96*  CO2 27  --  25  GLUCOSE 372* 395* 267*  BUN 18 20 23*  CREATININE 1.17 1.10 1.40*  CALCIUM 8.0*  --  8.5*   Liver Function Tests:  Recent Labs Lab 08/01/14 1453 08/02/14 2146  AST 15 19  ALT 11* 12*  ALKPHOS 102 124  BILITOT 1.0 1.3*  PROT 5.8* 6.8  ALBUMIN 2.7* 3.2*   No results for input(s): LIPASE, AMYLASE in the last 168 hours. No results for input(s): AMMONIA in the last 168 hours. CBC:  Recent Labs Lab 08/01/14 1453 08/01/14 1505 08/02/14 2146  WBC 7.4  --  8.3  NEUTROABS  --   --  5.5  HGB 9.4* 11.9* 10.6*  HCT 33.1* 35.0* 37.3*  MCV 72.1*  --  71.6*  PLT 187  --  233   Cardiac Enzymes:  Recent Labs Lab 08/03/14 0520 08/03/14 1048  TROPONINI 0.03 0.03   BNP (last 3 results)  Recent Labs  08/01/14 1453  BNP 960.6*    ProBNP (last 3 results) No results for input(s): PROBNP in the last 8760 hours.  CBG:  Recent Labs Lab 08/01/14 1625 08/01/14 1736 08/03/14 0405 08/03/14 0730 08/03/14 1136  GLUCAP 220* 83 166* 200* 210*    Micro Recent Results (from the past 240 hour(s))  Blood Culture (routine x 2)     Status: None (  Preliminary result)   Collection Time: 08/01/14  3:54 PM  Result Value Ref Range Status   Specimen Description BLOOD LEFT WRIST  Final   Special Requests BOTTLES DRAWN AEROBIC AND ANAEROBIC 5CC  Final   Culture   Final           BLOOD CULTURE RECEIVED NO GROWTH TO DATE CULTURE WILL BE HELD FOR 5 DAYS BEFORE ISSUING A FINAL NEGATIVE REPORT Performed at [facility]    Report Status PENDING  Incomplete  Blood Culture (routine x 2)     Status: None   Collection Time: 08/01/14  3:56 PM  Result Value Ref Range Status   Specimen Description BLOOD RIGHT WRIST  Final    Special Requests BOTTLES DRAWN AEROBIC AND ANAEROBIC 5CC  Final   Culture   Final    STAPHYLOCOCCUS SPECIES (COAGULASE NEGATIVE) Note: THE SIGNIFICANCE OF ISOLATING THIS ORGANISM FROM A SINGLE SET OF BLOOD CULTURES WHEN MULTIPLE SETS ARE DRAWN IS UNCERTAIN. PLEASE NOTIFY THE MICROBIOLOGY DEPARTMENT WITHIN ONE WEEK IF SPECIATION AND SENSITIVITIES ARE REQUIRED. Note: Gram Stain Report Called to,Read Back By and Verified With: LYNN MILLER 08/02/14 @ 1543 BY PARDA. Performed at [facility]    Report Status 08/03/2014 FINAL  Final  Urine culture     Status: None   Collection Time: 08/01/14  4:48 PM  Result Value Ref Range Status   Specimen Description URINE, RANDOM  Final   Special Requests NONE  Final   Colony Count NO GROWTH Performed at [facility]   Final   Culture NO GROWTH Performed at [facility]   Final   Report Status 08/02/2014 FINAL  Final  MRSA PCR Screening     Status: Abnormal   Collection Time: 08/03/14  4:50 AM  Result Value Ref Range Status   MRSA by PCR SUBOPTIMAL SPECIMEN (A) NEGATIVE Final    Comment: NOTIFIED CHRIS,RN AT 0521 ON [id number] SKEEN,P SPECIMEN CREDITED   MRSA PCR Screening     Status: None   Collection Time: 08/03/14  5:45 AM  Result Value Ref Range Status   MRSA by PCR NEGATIVE NEGATIVE Final    Comment:        The GeneXpert MRSA Assay (FDA approved for NASAL specimens only), is one component of a comprehensive MRSA colonization surveillance program. It is not intended to diagnose MRSA infection nor to guide or monitor treatment for MRSA infections.      Studies: Dg Chest Port 1 View  08/01/2014   CLINICAL DATA:  Left chest pain.  Productive cough.  EXAM: PORTABLE CHEST - 1 VIEW  COMPARISON:  05/12/2013.  FINDINGS: Stable enlarged cardiac silhouette and median sternotomy wires. Small right pleural effusion without significant change. Small amount of adjacent airspace opacity with progression. Diffusely prominent  pulmonary vasculature and interstitial markings with improvement. Stable left subclavian pacemaker. The distal portion of the lead is not clearly visualized due to overlapping soft tissues. Unremarkable bones.  IMPRESSION: 1. Stable small right pleural effusion. 2. Mild increase in patchy opacity at the right lung base. This could represent atelectasis or pneumonia. 3. Stable cardiomegaly with improved changes of congestive heart failure.   Electronically Signed   By: Beckie Salts M.D.   On: 08/01/2014 15:23    Scheduled Meds: . carvedilol  25 mg Oral BID WC  . ceFEPime (MAXIPIME) IV  1 g Intravenous 3 times per day  . digoxin  0.25 mg Intravenous Once  . furosemide  40 mg Oral Daily  .  insulin aspart  0-5 Units Subcutaneous QHS  . insulin aspart  0-9 Units Subcutaneous TID WC  . levofloxacin (LEVAQUIN) IV  750 mg Intravenous Q24H  . lisinopril  2.5 mg Oral Daily  . rivaroxaban  20 mg Oral QAC supper  . sodium chloride  3 mL Intravenous Q12H  . sodium chloride  3 mL Intravenous Q12H  . spironolactone  25 mg Oral Daily  . vancomycin  1,000 mg Intravenous Q12H   Continuous Infusions: . diltiazem (CARDIZEM) infusion 5 mg/hr (08/03/14 0810)       Time spent: 35 minutes    [facility]  [facility] Pager [phone] If 7PM-7AM, please contact night-coverage at [email or web address], password [email or web address] 08/03/2014, 12:59 PM  LOS: 0 days

## 2014-08-04 DIAGNOSIS — I5023 Acute on chronic systolic (congestive) heart failure: Secondary | ICD-10-CM

## 2014-08-04 DIAGNOSIS — I482 Chronic atrial fibrillation: Secondary | ICD-10-CM

## 2014-08-04 DIAGNOSIS — J189 Pneumonia, unspecified organism: Principal | ICD-10-CM

## 2014-08-04 LAB — GLUCOSE, CAPILLARY
GLUCOSE-CAPILLARY: 212 mg/dL — AB (ref 65–99)
Glucose-Capillary: 156 mg/dL — ABNORMAL HIGH (ref 65–99)
Glucose-Capillary: 161 mg/dL — ABNORMAL HIGH (ref 65–99)
Glucose-Capillary: 164 mg/dL — ABNORMAL HIGH (ref 65–99)
Glucose-Capillary: 207 mg/dL — ABNORMAL HIGH (ref 65–99)
Glucose-Capillary: 226 mg/dL — ABNORMAL HIGH (ref 65–99)

## 2014-08-04 LAB — LEGIONELLA ANTIGEN, URINE

## 2014-08-04 LAB — BASIC METABOLIC PANEL
ANION GAP: 10 (ref 5–15)
BUN: 26 mg/dL — ABNORMAL HIGH (ref 6–20)
CHLORIDE: 98 mmol/L — AB (ref 101–111)
CO2: 26 mmol/L (ref 22–32)
Calcium: 8.1 mg/dL — ABNORMAL LOW (ref 8.9–10.3)
Creatinine, Ser: 1.2 mg/dL (ref 0.61–1.24)
GFR calc Af Amer: >60 mL/min (ref >60)
GFR calc non Af Amer: >60 mL/min (ref >60)
GLUCOSE: 168 mg/dL — AB (ref 65–99)
Potassium: 4.1 mmol/L (ref 3.5–5.1)
SODIUM: 134 mmol/L — AB (ref 135–145)

## 2014-08-04 LAB — HEMOGLOBIN A1C
Hgb A1c MFr Bld: 8.4 % — ABNORMAL HIGH (ref 4.8–5.6)
Mean Plasma Glucose: 194 mg/dL

## 2014-08-04 MED ORDER — LISINOPRIL 2.5 MG PO TABS
2.5000 mg | ORAL_TABLET | Freq: Every day | ORAL | Status: DC
  Administered 2014-08-04 – 2014-08-05 (×2): 2.5 mg via ORAL
  Filled 2014-08-04 (×2): qty 1

## 2014-08-04 MED ORDER — DILTIAZEM HCL 30 MG PO TABS
30.0000 mg | ORAL_TABLET | Freq: Three times a day (TID) | ORAL | Status: DC
  Administered 2014-08-04: 30 mg via ORAL
  Filled 2014-08-04: qty 1

## 2014-08-04 NOTE — Progress Notes (Signed)
[facility] PROGRESS NOTE  Jacob [name] WUJ:[id number] DOB: 09-19-1955 DOA: 08/03/2014 PCP: [name], MD    HPI/Subjective: Was about to leave this morning AMA, he was talking about he has immigration appointment in [place], [place] at 1 PM. Feels much better, seeing sitting on chair at bedside.  Assessment/Plan:  CAP (community acquired pneumonia) -presented with SOB and green sputum production -XRAY consolidation found at right lung base -gram + cocci found in blood 1/2 culture, coagulase-negative staph likely contaminant. Repeat blood culture still pending. -Per pharmacy, IV Levaquin vancomycin and Cefepime. -Continue supportive management with bronchodilators, mucolytics, antitussives and oxygen as needed.  Acute on chronic systolic CHF -LVEF of 20-25% on 04/06/13, 2+ pitting edema in LE -CXR showed cardiomegaly  -Reported that he was not taking his medication for at least 1 week prior to admission. -Started on high dose of Lasix, echo repeated LVEF is still low at 20-25%. -I have asked cardiology to evaluate him for further recommendation regarding his CHF/A. Fib.  Acute respiratory failure -Oxygen saturation was 87% on admission, likely secondary to pneumonia and acute CHF. -Continue supplemental oxygen to keep oxygen saturation above 91%, treat above mentioned conditions.    DM (diabetes mellitus), type 2 with peripheral vascular complications -BG moderately controlled, hemoglobin A1c is 8.4 indicating uncontrolled diabetes mellitus type 2. -SSI-Novolog -Follow CBGs closely     Atrial fibrillation with RVR -Patient is stable -EKG shows atrial fibrillation -Anticoagulated with Xarelto, Rate controlled with Coreg. -Was on Cardizem drip this morning, heart rate dropped to the 40s, Cardizem drip discontinued. -Started on Cardizem 30 mg by mouth every 8 hours, continued Coreg 25 mg twice a day.  Anemia  -Hemoglobin 10.6 on 08/02/14 -CBC to monitor   Code  Status: Full code  Family Communication: None present. Plan discussed with patient  Disposition Plan: Remain inpatient.   Consultants:  Pharmacy   Procedures:  Echo  Antibiotics: cefepime 1g IV Q8H and Levaquin  IV Q24H. First dose 08/03/14.   Objective: Filed Vitals:   08/04/14 1139  BP: 102/59  Pulse: 77  Temp: 97.4 F (36.3 C)  Resp: 16    Intake/Output Summary (Last 24 hours) at 08/04/14 1502 Last data filed at 08/04/14 1140  Gross per 24 hour  Intake    885 ml  Output   2700 ml  Net  -1815 ml   Filed Weights   08/02/14 2142 08/04/14 0500  Weight: 109.77 kg (242 lb) 109.09 kg (240 lb 8 oz)    Exam:   General:  Patient is sitting up in bed in visible emotional distress worried about his condition. Stated he just wanted to go home.  Cardiovascular: AFIB. No gallops or friction rubs. No murmurs heard.  Respiratory: Some wheezing.  BL air movement.   Abdomen: Soft. Non tender.  Normoactive bowel sounds  Musculoskeletal: 2+ pitting edema in LE.  No visual deformities. Warm to touch.   Data Reviewed: Basic Metabolic Panel:  Recent Labs Lab 08/01/14 1453 08/01/14 1505 08/02/14 2146 08/04/14 0403  NA 132* 134* 133* 134*  K 3.9 3.9 3.8 4.1  CL 98* 94* 96* 98*  CO2 27  --  25 26  GLUCOSE 372* 395* 267* 168*  BUN 18 20 23* 26*  CREATININE 1.17 1.10 1.40* 1.20  CALCIUM 8.0*  --  8.5* 8.1*   Liver Function Tests:  Recent Labs Lab 08/01/14 1453 08/02/14 2146  AST 15 19  ALT 11* 12*  ALKPHOS 102 124  BILITOT 1.0 1.3*  PROT 5.8* 6.8  ALBUMIN 2.7* 3.2*   No results for input(s): LIPASE, AMYLASE in the last 168 hours. No results for input(s): AMMONIA in the last 168 hours. CBC:  Recent Labs Lab 08/01/14 1453 08/01/14 1505 08/02/14 2146  WBC 7.4  --  8.3  NEUTROABS  --   --  5.5  HGB 9.4* 11.9* 10.6*  HCT 33.1* 35.0* 37.3*  MCV 72.1*  --  71.6*  PLT 187  --  233   Cardiac Enzymes:  Recent Labs Lab 08/03/14 0520 08/03/14 1048  08/03/14 1530  TROPONINI 0.03 0.03 0.04*   BNP (last 3 results)  Recent Labs  08/01/14 1453  BNP 960.6*    ProBNP (last 3 results) No results for input(s): PROBNP in the last 8760 hours.  CBG:  Recent Labs Lab 08/03/14 2214 08/04/14 0044 08/04/14 0522 08/04/14 0741 08/04/14 1137  GLUCAP 194* 161* 164* 156* 226*    Recent Results (from the past 240 hour(s))  Blood Culture (routine x 2)     Status: None (Preliminary result)   Collection Time: 08/01/14  3:54 PM  Result Value Ref Range Status   Specimen Description BLOOD LEFT WRIST  Final   Special Requests BOTTLES DRAWN AEROBIC AND ANAEROBIC 5CC  Final   Culture   Final           BLOOD CULTURE RECEIVED NO GROWTH TO DATE CULTURE WILL BE HELD FOR 5 DAYS BEFORE ISSUING A FINAL NEGATIVE REPORT Performed at [facility]    Report Status PENDING  Incomplete  Blood Culture (routine x 2)     Status: None   Collection Time: 08/01/14  3:56 PM  Result Value Ref Range Status   Specimen Description BLOOD RIGHT WRIST  Final   Special Requests BOTTLES DRAWN AEROBIC AND ANAEROBIC 5CC  Final   Culture   Final    STAPHYLOCOCCUS SPECIES (COAGULASE NEGATIVE) Note: THE SIGNIFICANCE OF ISOLATING THIS ORGANISM FROM A SINGLE SET OF BLOOD CULTURES WHEN MULTIPLE SETS ARE DRAWN IS UNCERTAIN. PLEASE NOTIFY THE MICROBIOLOGY DEPARTMENT WITHIN ONE WEEK IF SPECIATION AND SENSITIVITIES ARE REQUIRED. Note: Gram Stain Report Called to,Read Back By and Verified With: LYNN MILLER 08/02/14 @ 1543 BY PARDA. Performed at [facility]    Report Status 08/03/2014 FINAL  Final  Urine culture     Status: None   Collection Time: 08/01/14  4:48 PM  Result Value Ref Range Status   Specimen Description URINE, RANDOM  Final   Special Requests NONE  Final   Colony Count NO GROWTH Performed at [facility]   Final   Culture NO GROWTH Performed at [facility]   Final   Report Status 08/02/2014 FINAL  Final  Culture, blood  (routine x 2)     Status: None (Preliminary result)   Collection Time: 08/03/14  2:25 AM  Result Value Ref Range Status   Specimen Description BLOOD LEFT ANTECUBITAL  Final   Special Requests BOTTLES DRAWN AEROBIC AND ANAEROBIC 5CC EACH  Final   Culture   Final           BLOOD CULTURE RECEIVED NO GROWTH TO DATE CULTURE WILL BE HELD FOR 5 DAYS BEFORE ISSUING A FINAL NEGATIVE REPORT Performed at [facility]    Report Status PENDING  Incomplete  Culture, blood (routine x 2)     Status: None (Preliminary result)   Collection Time: 08/03/14  2:30 AM  Result Value Ref Range Status   Specimen Description BLOOD BLOOD LEFT FOREARM  Final  Special Requests BOTTLES DRAWN AEROBIC AND ANAEROBIC 5CC EACH  Final   Culture   Final           BLOOD CULTURE RECEIVED NO GROWTH TO DATE CULTURE WILL BE HELD FOR 5 DAYS BEFORE ISSUING A FINAL NEGATIVE REPORT Performed at [facility]    Report Status PENDING  Incomplete  MRSA PCR Screening     Status: Abnormal   Collection Time: 08/03/14  4:50 AM  Result Value Ref Range Status   MRSA by PCR SUBOPTIMAL SPECIMEN (A) NEGATIVE Final    Comment: NOTIFIED CHRIS,RN AT 0521 ON [id number] SKEEN,P SPECIMEN CREDITED   MRSA PCR Screening     Status: None   Collection Time: 08/03/14  5:45 AM  Result Value Ref Range Status   MRSA by PCR NEGATIVE NEGATIVE Final    Comment:        The GeneXpert MRSA Assay (FDA approved for NASAL specimens only), is one component of a comprehensive MRSA colonization surveillance program. It is not intended to diagnose MRSA infection nor to guide or monitor treatment for MRSA infections.      Studies: No results found.  Scheduled Meds: . carvedilol  25 mg Oral BID WC  . ceFEPime (MAXIPIME) IV  1 g Intravenous 3 times per day  . digoxin  0.25 mg Intravenous Once  . diltiazem  30 mg Oral 3 times per day  . furosemide  40 mg Intravenous 3 times per day  . insulin aspart  0-9 Units Subcutaneous TID WC  .  levofloxacin (LEVAQUIN) IV  750 mg Intravenous Q24H  . rivaroxaban  20 mg Oral QAC supper  . sodium chloride  3 mL Intravenous Q12H  . spironolactone  25 mg Oral Daily  . vancomycin  1,000 mg Intravenous Q12H   Continuous Infusions:       Time spent: 40 minutes    [facility]  [facility] Pager [phone]. If 7PM-7AM, please contact night-coverage at [email or web address], password [facility] 08/04/2014, 3:02 PM  LOS: 1 day

## 2014-08-04 NOTE — Consult Note (Signed)
CARDIOLOGY CONSULT NOTE   Patient ID: Jacob [name] MRN: [id number] DOB/AGE: Jan 18, 1956 59 y.o.  Admit date: 08/03/2014  Primary Physician   [name], MD Primary Cardiologist   Dr. Allyson Sabal Reason for Consultation CHF  HPI: Jacob [name] is a 59 year old moderately overweight, homelessness [place]  male with hx of CAD s/p CABG, PAD s/p right external iliac artery stent, Chronic AF on Xaralto, systolic CHF,  health care associated pneumonia, HTN, HL, DM, and 100 pack year hx of tobacco abuse who consulted for acute onset of CHF.   He has a history of coronary artery disease status post bypass grafting x2 at [facility] April 2013. 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 Ladona Ridgel. Most recent cath 12/02/11 showed 20-30% diffuse stenosis to proximal and mid LAD; 40% distal LAD; 60-70% 1st and 2nd diagonal branches; 30% LCx; moderate pulmonary HTN. He had had a REIA PTA 08/30/13. Bilateral SFA occlusion, medical management per Dr Allyson Sabal as he does not have anatomy suitable for percutaneous intervention. Echo 04/06/13 showed LVEF of 20-25% with mild LVH. He was not followed with any cardiologist since September 2015. He is non compliment with office f/u and medication due to homelessness and lack of insurance. He is getting his medication from wellness clinic. He was not taking his medication for at least 1 week prior to admission.   He presented to the emergency department 08/03/14 evening after being notified that his blood cultures were positive. Blood cultures obtained during encounter of 08/01/14. Patient presented at this time 5/9 complaining of 4 days of increasing cough, peripheral edema, lower extremity swelling, and weight gain. Patient reported orthopnea with an inability to lie flat. Patient states to me that he has also developed a new, congested sounding cough. He denies any fever or chills. Chest x-ray showed a patchy opacity most  consistent with pneumonia. He was admitted for HCAP. Echo 08/03/14 showed LV EF 20-25%, diffuse hypokiness, LVH, left pleural effusion. Initial EKG showed a.fib with rate of 101.  Trop 0.04, K normal, Cr 1.2, CXR showed cardiomegaly. He was on Cardizem drip this morning, heart rate dropped to the 40s, Cardizem drip discontinued and started on started on Cardizem 30 mg by mouth every 8 hours.    Past Medical History  Diagnosis Date  . Hypertension   . Systolic heart failure     EF is 40-45% by echo, December 2013  . Noncompliance   . CAD (coronary artery disease) Sept 2013    s/p cardiac cath showing occlusion of small RCA with collaterals  . Chronic anticoagulation     on coumadin  . Atrial fibrillation   . Peripheral arterial disease   . Automatic implantable cardioverter-defibrillator in situ   . High cholesterol   . CHF (congestive heart failure)   . Heart murmur   . Myocardial infarction 2014  . Type II diabetes mellitus      Past Surgical History  Procedure Laterality Date  . Implantable cardioverter defibrillator implant      [place] in 07/2012; [employer]  . Coronary artery bypass graft  09/2012    2 vessels per patient [name]   . Coronary angioplasty with stent placement  11/2011    "1"  . Cardiac catheterization  09/2012  . Iliac artery stent Right 08/30/2013  . Left and right heart catheterization with coronary angiogram N/A 12/02/2011    Procedure: LEFT AND RIGHT HEART CATHETERIZATION WITH CORONARY ANGIOGRAM;  Surgeon: Nile Dear  Clifton Lauri Till, MD;  Location: [facility] CATH LAB;  Service: Cardiovascular;  Laterality: N/A;  . Lower extremity angiogram N/A 08/30/2013    Procedure: LOWER EXTREMITY ANGIOGRAM;  Surgeon: Runell Gess, MD;  Location: [facility] CATH LAB;  Service: Cardiovascular;  Laterality: N/A;  . Lower extremity angiogram N/A 12/02/2013    Procedure: LOWER EXTREMITY ANGIOGRAM;  Surgeon: Runell Gess, MD;  Location: [facility] CATH LAB;  Service: Cardiovascular;   Laterality: N/A;    No Known Allergies  I have reviewed the patient's current medications . carvedilol  25 mg Oral BID WC  . ceFEPime (MAXIPIME) IV  1 g Intravenous 3 times per day  . digoxin  0.25 mg Intravenous Once  . diltiazem  30 mg Oral 3 times per day  . furosemide  40 mg Intravenous 3 times per day  . insulin aspart  0-9 Units Subcutaneous TID WC  . levofloxacin (LEVAQUIN) IV  750 mg Intravenous Q24H  . rivaroxaban  20 mg Oral QAC supper  . sodium chloride  3 mL Intravenous Q12H  . spironolactone  25 mg Oral Daily  . vancomycin  1,000 mg Intravenous Q12H     acetaminophen **OR** acetaminophen  Prior to Admission medications   Medication Sig Start Date End Date Taking? Authorizing Provider  carvedilol (COREG) 25 MG tablet Take 1 tablet (25 mg total) by mouth 2 (two) times daily. 04/12/14  Yes [name], MD  furosemide (LASIX) 40 MG tablet Take 1 tablet (40 mg total) by mouth daily. 04/12/14  Yes [name], MD  glipiZIDE (GLUCOTROL) 5 MG tablet Take 1 tablet (5 mg total) by mouth 2 (two) times daily before a meal. 04/12/14  Yes Olugbemiga E Hyman Hopes, MD  levofloxacin (LEVAQUIN) 500 MG tablet Take 1 tablet (500 mg total) by mouth daily. 08/01/14  Yes Alison Murray, MD  lisinopril (ZESTRIL) 2.5 MG tablet Take 1 tablet (2.5 mg total) by mouth daily. 04/12/14  Yes [name], MD  rivaroxaban (XARELTO) 20 MG TABS tablet Take 1 tablet (20 mg total) by mouth daily before supper. 04/12/14  Yes [name], MD  spironolactone (ALDACTONE) 25 MG tablet Take 1 tablet (25 mg total) by mouth daily. 04/12/14  Yes [name], MD     History   Social History  . Marital Status: Divorced    Spouse Name: N/A  . Number of Children: 3  . Years of Education: N/A   Occupational History  . Unemployed    Social History Main Topics  . Smoking status: Current Every Day Smoker -- 0.25 packs/day for 38 years    Types: Cigarettes  . Smokeless tobacco: Never  Used  . Alcohol Use: No  . Drug Use: No  . Sexual Activity: Yes   Other Topics Concern  . Not on file   Social History Narrative   Has an apartment with a roommate. He was living on the streets in 15-Jan-2013.  He reports that his father died in [place] in 2013/01/15.  He is divorced.  He is no longer estranged from his son, but still from his daughter who lives locally.  Neither of his parents, nor any siblings have any history of CAD.    Family Status  Relation Status Death Age  . Mother Alive 31s    No CAD  . Father Deceased 85s    No CAD   Family History  Problem Relation Age of Onset  . Diabetes Mother      ROS:  Full 14 point review of systems complete and found to be negative unless listed above.  Physical Exam: Blood pressure 102/59, pulse 77, temperature 97.4 F (36.3 C), temperature source Oral, resp. rate 16, height 5\' 6"  (1.676 m), weight 240 lb 8 oz (109.09 kg), SpO2 93 %.  General: Well developed, well nourished, male in no acute distress Head: Eyes PERRLA, No xanthomas. Normocephalic and atraumatic, oropharynx without edema or exudate.  Lungs: Resp regular. Bibasilar crackles.  Heart: irregularly irregular no s3, s4, or murmurs..   Neck: No carotid bruits. No lymphadenopathy.  + JVD. Abdomen: Bowel sounds present, abdomen soft and non-tender without masses or hernias noted. Msk:  No spine or cva tenderness. No weakness, no joint deformities or effusions. Extremities: No clubbing, cyanosis. 1-2+ LE edema. DP/PT/Radials 2+ and equal bilaterally. Neuro: Alert and oriented X 3. No focal deficits noted. Psych:  Good affect, responds appropriately Skin: No rashes or lesions noted.  Labs:   Lab Results  Component Value Date   WBC 8.3 08/02/2014   HGB 10.6* 08/02/2014   HCT 37.3* 08/02/2014   MCV 71.6* 08/02/2014   PLT 233 08/02/2014    Recent Labs Lab 08/02/14 2146 08/04/14 0403  NA 133* 134*  K 3.8 4.1  CL 96* 98*  CO2 25 26  BUN 23* 26*    CREATININE 1.40* 1.20  CALCIUM 8.5* 8.1*  PROT 6.8  --   BILITOT 1.3*  --   ALKPHOS 124  --   ALT 12*  --   AST 19  --   GLUCOSE 267* 168*  ALBUMIN 3.2*  --      Recent Labs  08/03/14 0520 08/03/14 1048 08/03/14 1530  TROPONINI 0.03 0.03 0.04*     Echo: Study Conclusions  - Left ventricle: The cavity size was normal. Left ventricular geometry showed evidence of eccentric hypertrophy. Systolic function was severely reduced. The estimated ejection fraction was in the range of 20% to 25%. Severe diffuse hypokinesis with no identifiable regional variations. Doppler parameters are consistent with elevated mean left atrial filling pressure. Acoustic contrast opacification revealed no evidence ofthrombus. - Left atrium: The atrium was severely dilated. - Right ventricle: The cavity size was mildly dilated. Systolic function was severely reduced. - Right atrium: The atrium was severely dilated. - Pericardium, extracardiac: There was a left pleural effusion.  ECG:  afib with RVR non specific T wave abnormality in lateral lead. No acute changes.    ASSESSMENT AND PLAN:     1. Acute on chronic systolic CHF - Echo 5/11 showed LFEF 20-25%, unchanged from 03/2013. Pt appears to be volume overloaded. CXR showed cardiomegaly. Recently noticed worsening of exertional dyspnea. He has been not taking his medication for the past 7-10 days. Net I/O negative 3.2L and 2LB wt loss since admission. Strict I/O. Continue coreg 25mg  BID, IV lasix 40mg  TID, spironolactone 25mg . Monitor lytes and kidney function. Trop 0.04, likely demand. No need for ischemic workup. He need to be regularly seen by cardiologist. Lisinopril discontinued yesterday, will restart, and hold if SBP is less than 90. May consider adding digoxin. BNP 960.6.  TSH normal.  2. Afib with RVR - Chronic afib. On xarelto. Tele showed rate controlled to 80s. He was on Cardizem drip this morning, heart rate dropped to  the 40s, Cardizem drip discontinued and started on started on Cardizem 30 mg by mouth every 8 hours. Will discontinue cardizem in a setting of heart failure. Will add amio if rate is not well controlled. Continue Xarelto.  CHADSVASc=5  3. CAD - Trop 0.04. Likely demand. No CP. He has [employer] ICD. Will get ICD interrogation. Most recent cath 12/02/11 showed 20-30% diffuse stenosis to proximal and mid LAD; 40% distal LAD; 60-70% 1st and 2nd diagonal branches; 30% LCx; moderate pulmonary HTN. Will get lipid panel.   4. Tobacco moking - Smoking cessation recommended. Education given.   5. DM -Per IM  SignedManson Passey, PA 08/04/2014, 2:53 PM Pager [phone]  Co-Sign MD  History and all data above reviewed.  Patient examined.  I agree with the findings as above.  The patient presented with about two weeks of cough and SOB.  He is being treated for pneumonia.  However, he also has an ischemic CM and is off meds and has not been seen in awhile.  He lives in [place].  He gets his meds in [place] and has no transportation.  He did have mild edema on CXR and an elevated BNP.  He does not weight himself and does have some mild leg edema.    The patient exam reveals COR:RRR  ,  Lungs: Few left greater than right basilar crackles.  ,  Abd: Positive bowel sounds, no rebound no guarding, Ext Mild edema  .  All available labs, radiology testing, previous records reviewed. Agree with documented assessment and plan. Acute on chronic systolic HF:  He needs to be restarted on ACE which was stopped after a dose or two this admit because of hypotension.  We will stop his Cardizem which was used for his afib with RVR.  He is restarted on his beta blocker.  I agree with the current diuretic.  We will get the ICD checked.  Ultimately compliance and follow up will be a problem unless he can get care and meds close to home.    Fayrene Fearing Margarette Vannatter  4:51 PM  08/04/2014

## 2014-08-05 LAB — BASIC METABOLIC PANEL
Anion gap: 10 (ref 5–15)
BUN: 27 mg/dL — ABNORMAL HIGH (ref 6–20)
CO2: 29 mmol/L (ref 22–32)
CREATININE: 1.33 mg/dL — AB (ref 0.61–1.24)
Calcium: 8.2 mg/dL — ABNORMAL LOW (ref 8.9–10.3)
Chloride: 93 mmol/L — ABNORMAL LOW (ref 101–111)
GFR calc Af Amer: >60 mL/min (ref >60)
GFR calc non Af Amer: 58 mL/min — ABNORMAL LOW (ref >60)
Glucose, Bld: 224 mg/dL — ABNORMAL HIGH (ref 65–99)
Potassium: 4.3 mmol/L (ref 3.5–5.1)
Sodium: 132 mmol/L — ABNORMAL LOW (ref 135–145)

## 2014-08-05 LAB — LIPID PANEL
CHOLESTEROL: 80 mg/dL (ref 0–200)
HDL: 18 mg/dL — ABNORMAL LOW (ref >40)
LDL CALC: 52 mg/dL (ref 0–99)
Total CHOL/HDL Ratio: 4.4 RATIO
Triglycerides: 51 mg/dL (ref <150)
VLDL: 10 mg/dL (ref 0–40)

## 2014-08-05 LAB — GLUCOSE, CAPILLARY
Glucose-Capillary: 210 mg/dL — ABNORMAL HIGH (ref 65–99)
Glucose-Capillary: 175 mg/dL — ABNORMAL HIGH (ref 65–99)

## 2014-08-05 NOTE — Progress Notes (Signed)
Pt called me into the room to tell me that his ride could only come now and despite the need for antibiotics and lasix, he had to go now. Maxipime running and he agreed to let it finish but he was not going to stay for the other medications. He has been educated and encouraged to stay and still signed AMA papers. Text page sent to Dr. Arthor Captain and Dr. Anne Fu.

## 2014-08-05 NOTE — Progress Notes (Signed)
Inpatient Diabetes Program Recommendations  AACE/ADA: New Consensus Statement on Inpatient Glycemic Control (2013)  Target Ranges:  Prepandial:   less than 140 mg/dL      Peak postprandial:   less than 180 mg/dL (1-2 hours)      Critically ill patients:  140 - 180 mg/dL   Results for KAGAN, TIBBITTS (MRN [id number]) as of 08/05/2014 12:48  Ref. Range 08/04/2014 07:41 08/04/2014 11:37 08/04/2014 16:27 08/04/2014 21:08 08/05/2014 07:27 08/05/2014 12:11  Glucose-Capillary Latest Ref Range: 65-99 mg/dL 235 (H) 573 (H) 220 (H) 212 (H) 210 (H) 175 (H)   Reason for Visit: CAP  Diabetes history: DM 2 Outpatient Diabetes medications: Glipizide 5 mg BID (Pt not taking/out of med) Current orders for Inpatient glycemic control: Novolog 0-9 units TID  Inpatient Diabetes Program Recommendations Correction (SSI): Glucose in the 200's yesterday. Please consider increasing correction to Novolog 0-15 units (moderate scale) TID and adding Novolog 0-5 units QHS for bedtime coverage.   Thanks,  Christena Deem RN, MSN, [facility] Inpatient Diabetes Coordinator Team Pager [phone]

## 2014-08-05 NOTE — Progress Notes (Signed)
Patient Name: Jacob [name] Date of Encounter: 08/05/2014  Primary Cardiologist: Dr. Allyson Sabal   Principal Problem:   Acute respiratory failure Active Problems:   DM (diabetes mellitus), type 2 with peripheral vascular complications   CAP (community acquired pneumonia)   Atrial fibrillation with RVR   Acute on chronic systolic CHF (congestive heart failure)    SUBJECTIVE  Denies any CP or SOB. Persistent LE edema  CURRENT MEDS . carvedilol  25 mg Oral BID WC  . ceFEPime (MAXIPIME) IV  1 g Intravenous 3 times per day  . digoxin  0.25 mg Intravenous Once  . furosemide  40 mg Intravenous 3 times per day  . insulin aspart  0-9 Units Subcutaneous TID WC  . levofloxacin (LEVAQUIN) IV  750 mg Intravenous Q24H  . lisinopril  2.5 mg Oral Daily  . rivaroxaban  20 mg Oral QAC supper  . sodium chloride  3 mL Intravenous Q12H  . spironolactone  25 mg Oral Daily  . vancomycin  1,000 mg Intravenous Q12H    OBJECTIVE  Filed Vitals:   08/05/14 0026 08/05/14 0600 08/05/14 0730 08/05/14 0854  BP: 100/67 126/79 107/66 116/69  Pulse: 79 110 113   Temp: 97.6 F (36.4 C) 97.9 F (36.6 C) 98 F (36.7 C)   TempSrc: Oral Oral Oral   Resp:      Height:      Weight:      SpO2: 97% 96% 96%     Intake/Output Summary (Last 24 hours) at 08/05/14 0907 Last data filed at 08/05/14 0700  Gross per 24 hour  Intake   1050 ml  Output   2300 ml  Net  -1250 ml   Filed Weights   08/02/14 2142 08/04/14 0500  Weight: 242 lb (109.77 kg) 240 lb 8 oz (109.09 kg)    PHYSICAL EXAM  General: Pleasant, NAD. Neuro: Alert and oriented X 3. Moves all extremities spontaneously. Psych: Normal affect. HEENT:  Normal  Neck: Supple without bruits or JVD. Lungs:  Resp regular and unlabored. Decreased breath sound bilaterally, with intermittent rale near bases.  Heart: irregular. no s3, s4, or murmurs. Abdomen: Soft, non-tender, non-distended, BS + x 4.  Extremities: No clubbing, cyanosis. DP/PT/Radials 2+ and  equal bilaterally. 2+ pitting edema in bilateral LE  Accessory Clinical Findings  CBC  Recent Labs  08/02/14 2146  WBC 8.3  NEUTROABS 5.5  HGB 10.6*  HCT 37.3*  MCV 71.6*  PLT 233   Basic Metabolic Panel  Recent Labs  08/04/14 0403 08/05/14 0317  NA 134* 132*  K 4.1 4.3  CL 98* 93*  CO2 26 29  GLUCOSE 168* 224*  BUN 26* 27*  CREATININE 1.20 1.33*  CALCIUM 8.1* 8.2*   Liver Function Tests  Recent Labs  08/02/14 2146  AST 19  ALT 12*  ALKPHOS 124  BILITOT 1.3*  PROT 6.8  ALBUMIN 3.2*   Cardiac Enzymes  Recent Labs  08/03/14 0520 08/03/14 1048 08/03/14 1530  TROPONINI 0.03 0.03 0.04*   Hemoglobin A1C  Recent Labs  08/03/14 0520  HGBA1C 8.4*   Fasting Lipid Panel  Recent Labs  08/05/14 0317  CHOL 80  HDL 18*  LDLCALC 52  TRIG 51  CHOLHDL 4.4   Thyroid Function Tests  Recent Labs  08/03/14 0520  TSH 2.301    TELE A-fib with HR 80-90s    ECG  No new EKG  Echocardiogram 08/03/2014  LV EF: 20% -  25%  ------------------------------------------------------------------- Indications:   Atrial fibrillation -  427.31.  ------------------------------------------------------------------- History:  PMH:  Coronary artery disease. Congestive heart failure. PMH:  Myocardial infarction. Risk factors: Current tobacco use. Hypertension. Diabetes mellitus.  ------------------------------------------------------------------- Study Conclusions  - Left ventricle: The cavity size was normal. Left ventricular geometry showed evidence of eccentric hypertrophy. Systolic function was severely reduced. The estimated ejection fraction was in the range of 20% to 25%. Severe diffuse hypokinesis with no identifiable regional variations. Doppler parameters are consistent with elevated mean left atrial filling pressure. Acoustic contrast opacification revealed no evidence ofthrombus. - Left atrium: The atrium was severely  dilated. - Right ventricle: The cavity size was mildly dilated. Systolic function was severely reduced. - Right atrium: The atrium was severely dilated. - Pericardium, extracardiac: There was a left pleural effusion.    Radiology/Studies  Dg Chest Port 1 View  08/01/2014   CLINICAL DATA:  Left chest pain.  Productive cough.  EXAM: PORTABLE CHEST - 1 VIEW  COMPARISON:  05/12/2013.  FINDINGS: Stable enlarged cardiac silhouette and median sternotomy wires. Small right pleural effusion without significant change. Small amount of adjacent airspace opacity with progression. Diffusely prominent pulmonary vasculature and interstitial markings with improvement. Stable left subclavian pacemaker. The distal portion of the lead is not clearly visualized due to overlapping soft tissues. Unremarkable bones.  IMPRESSION: 1. Stable small right pleural effusion. 2. Mild increase in patchy opacity at the right lung base. This could represent atelectasis or pneumonia. 3. Stable cardiomegaly with improved changes of congestive heart failure.   Electronically Signed   By: Beckie Salts M.D.   On: 08/01/2014 15:23    ASSESSMENT AND PLAN  59 yo male who recently run out of medication for 7-9 days present with HF.   1. Acute on chronic systolic HF  - still fluid overloaded on exam, with elevated JVD, 2+ pitting edema in LE, rale in bilateral bases, will continue to push for IV diuresis. Monitor renal function, understandably echo also shows severe RV dysfunction, so his symptom maybe related to biventricular failure  - IM treating prophylactically for CAP, afebrile overnight  2. Chronic AF with RVR on xarelto  - currently in a-fib controlled on coreg, HR mainly in 80-90s, occasional spike in to 110-120s  3. ICD s/p [employer] ICD in [place] 2013  - baseline EF 20-25% on echo during this admission  - Device interrogated, no recent shock or ATP, <1 V pacing, lots of NSVT, however no persistent ventricular  ectopy  4. CAD s/p CABGx2 at [facility] 2013  - 12/02/11 showed 20-30% diffuse stenosis to proximal and mid LAD; 40% distal LAD; 60-70% 1st and 2nd diagonal branches; 30% LCx; moderate pulmonary HTN  - mildly elevated trop, likely demand ischemia  5. PAD s/p R external iliac stent   6. HTN 7. HLD 8. DM  Signed, Azalee Course PA-C Pager: [phone]  Personally seen and examined. Agree with above. HFrEF - Lasix IV continue. +rales, edema AFIB - occasional HR elevation ICD CAD-CABG PVD as above Xarelto  Needs further diuresis.   Donato Schultz, MD

## 2014-08-05 NOTE — Discharge Summary (Signed)
Physician Discharge Summary  Jacob [name] OZH:[id number] DOB: 05/17/1955 DOA: 08/03/2014  PCP: [name], MD  Admit date: 08/03/2014 Discharge date: 08/05/2014    Left AMA Time spent: 15 minutes  Recommendations for Outpatient Follow-up:  1. Patient left AGAINST MEDICAL ADVICE, despite my discussion with him in 3 days in a row.. 2. At the time of discharge patient needs further diuresis, still has evidence of clinical fluid overload from bibasilar rales and lower extremity edema. 3. Patient understands there will that he might have serious complications/consequences upon leaving AMA this time.  Discharge Diagnoses:  Principal Problem:   Acute respiratory failure Active Problems:   DM (diabetes mellitus), type 2 with peripheral vascular complications   CAP (community acquired pneumonia)   Atrial fibrillation with RVR   Acute on chronic systolic CHF (congestive heart failure)   Discharge Condition: Fair  Diet recommendation: Heart healthy  Filed Weights   08/02/14 2142 08/04/14 0500  Weight: 109.77 kg (242 lb) 109.09 kg (240 lb 8 oz)    History of present illness:  59 yo male with htn, CHF (EF 20-25%), Pafib (CHADS2=5), apparently c/o cough (dry), dyspnea, and CXR yesterday showed possible pneumonia vs atelectasis. Pt denies fever, chills, cp, palp, n/v, diarrhea, dysuria, hematuria, but blood culture 1/2 => gram + cocci. Pt apparently left AMA yesterday, and now returns, ED requests admission for possible hcap, and also afib with rvr.   Hospital Course:   CAP (community acquired pneumonia) -presented with SOB and green sputum production -XRAY consolidation found at right lung base -gram + cocci found in blood 1/2 culture, coagulase-negative staph likely contaminant. Repeat blood culture still pending. -Per pharmacy, IV Levaquin vancomycin and Cefepime. -Continue supportive management with bronchodilators, mucolytics, antitussives and oxygen as needed.  Acute on  chronic systolic CHF -LVEF of 20-25% on 04/06/13, 2+ pitting edema in LE -CXR showed cardiomegaly  -Reported that he was not taking his medication for at least 1 week prior to admission. -Started on high dose of Lasix, echo repeated LVEF is still low at 20-25%. -I have asked cardiology to evaluate him for further recommendation regarding his CHF/A. Fib.  Acute respiratory failure -Oxygen saturation was 87% on admission, likely secondary to pneumonia and acute CHF. -Continue supplemental oxygen to keep oxygen saturation above 91%, treat above mentioned conditions.   DM (diabetes mellitus), type 2 with peripheral vascular complications -BG moderately controlled, hemoglobin A1c is 8.4 indicating uncontrolled diabetes mellitus type 2. -SSI-Novolog -Follow CBGs closely   Atrial fibrillation with RVR -Patient is stable -EKG shows atrial fibrillation -Anticoagulated with Xarelto, Rate controlled with Coreg. -Was on Cardizem drip this morning, heart rate dropped to the 40s, Cardizem drip discontinued. -Started on Cardizem 30 mg by mouth every 8 hours, continued Coreg 25 mg twice a day.  Anemia  -Hemoglobin 10.6 on 08/02/14 -CBC to monitor   Procedures:  None  Consultations:  Cardiology  Discharge Exam: Filed Vitals:   08/05/14 0854  BP: 116/69  Pulse:   Temp:   Resp:    General: Alert and awake, oriented x3, not in any acute distress. HEENT: anicteric sclera, pupils reactive to light and accommodation, EOMI CVS: S1-S2 clear, no murmur rubs or gallops Chest: clear to auscultation bilaterally, no wheezing, rales or rhonchi Abdomen: soft nontender, nondistended, normal bowel sounds, no organomegaly Extremities: no cyanosis, clubbing or edema noted bilaterally Neuro: Cranial nerves II-XII intact, no focal neurological deficits  Discharge Instructions  Medications were not reconsulted the time of discharge, I have asked him to take the  medication he was taking prior to  admission.  Current Discharge Medication List    CONTINUE these medications which have NOT CHANGED   Details  carvedilol (COREG) 25 MG tablet Take 1 tablet (25 mg total) by mouth 2 (two) times daily. Qty: 60 tablet, Refills: 0   Associated Diagnoses: Essential hypertension; Chronic systolic congestive heart failure; Acute on chronic combined systolic and diastolic heart failure; Atrial fibrillation with rapid ventricular response    furosemide (LASIX) 40 MG tablet Take 1 tablet (40 mg total) by mouth daily. Qty: 30 tablet, Refills: 0    glipiZIDE (GLUCOTROL) 5 MG tablet Take 1 tablet (5 mg total) by mouth 2 (two) times daily before a meal. Qty: 60 tablet, Refills: 0    levofloxacin (LEVAQUIN) 500 MG tablet Take 1 tablet (500 mg total) by mouth daily. Qty: 7 tablet, Refills: 0    lisinopril (ZESTRIL) 2.5 MG tablet Take 1 tablet (2.5 mg total) by mouth daily. Qty: 30 tablet, Refills: 0    rivaroxaban (XARELTO) 20 MG TABS tablet Take 1 tablet (20 mg total) by mouth daily before supper. Qty: 30 tablet, Refills: 6    spironolactone (ALDACTONE) 25 MG tablet Take 1 tablet (25 mg total) by mouth daily. Qty: 30 tablet, Refills: 6       No Known Allergies    The results of significant diagnostics from this hospitalization (including imaging, microbiology, ancillary and laboratory) are listed below for reference.    Significant Diagnostic Studies: Dg Chest Port 1 View  08/01/2014   CLINICAL DATA:  Left chest pain.  Productive cough.  EXAM: PORTABLE CHEST - 1 VIEW  COMPARISON:  05/12/2013.  FINDINGS: Stable enlarged cardiac silhouette and median sternotomy wires. Small right pleural effusion without significant change. Small amount of adjacent airspace opacity with progression. Diffusely prominent pulmonary vasculature and interstitial markings with improvement. Stable left subclavian pacemaker. The distal portion of the lead is not clearly visualized due to overlapping soft tissues.  Unremarkable bones.  IMPRESSION: 1. Stable small right pleural effusion. 2. Mild increase in patchy opacity at the right lung base. This could represent atelectasis or pneumonia. 3. Stable cardiomegaly with improved changes of congestive heart failure.   Electronically Signed   By: Beckie Salts M.D.   On: 08/01/2014 15:23    Microbiology: Recent Results (from the past 240 hour(s))  Blood Culture (routine x 2)     Status: None (Preliminary result)   Collection Time: 08/01/14  3:54 PM  Result Value Ref Range Status   Specimen Description BLOOD LEFT WRIST  Final   Special Requests BOTTLES DRAWN AEROBIC AND ANAEROBIC 5CC  Final   Culture   Final           BLOOD CULTURE RECEIVED NO GROWTH TO DATE CULTURE WILL BE HELD FOR 5 DAYS BEFORE ISSUING A FINAL NEGATIVE REPORT Performed at [facility]    Report Status PENDING  Incomplete  Blood Culture (routine x 2)     Status: None   Collection Time: 08/01/14  3:56 PM  Result Value Ref Range Status   Specimen Description BLOOD RIGHT WRIST  Final   Special Requests BOTTLES DRAWN AEROBIC AND ANAEROBIC 5CC  Final   Culture   Final    STAPHYLOCOCCUS SPECIES (COAGULASE NEGATIVE) Note: THE SIGNIFICANCE OF ISOLATING THIS ORGANISM FROM A SINGLE SET OF BLOOD CULTURES WHEN MULTIPLE SETS ARE DRAWN IS UNCERTAIN. PLEASE NOTIFY THE MICROBIOLOGY DEPARTMENT WITHIN ONE WEEK IF SPECIATION AND SENSITIVITIES ARE REQUIRED. Note: Gram Stain Report Called to,Read Back  By and Verified With: LYNN MILLER 08/02/14 @ 1543 BY PARDA. Performed at [facility]    Report Status 08/03/2014 FINAL  Final  Urine culture     Status: None   Collection Time: 08/01/14  4:48 PM  Result Value Ref Range Status   Specimen Description URINE, RANDOM  Final   Special Requests NONE  Final   Colony Count NO GROWTH Performed at [facility]   Final   Culture NO GROWTH Performed at [facility]   Final   Report Status 08/02/2014 FINAL  Final  Culture, blood  (routine x 2)     Status: None (Preliminary result)   Collection Time: 08/03/14  2:25 AM  Result Value Ref Range Status   Specimen Description BLOOD LEFT ANTECUBITAL  Final   Special Requests BOTTLES DRAWN AEROBIC AND ANAEROBIC 5CC EACH  Final   Culture   Final           BLOOD CULTURE RECEIVED NO GROWTH TO DATE CULTURE WILL BE HELD FOR 5 DAYS BEFORE ISSUING A FINAL NEGATIVE REPORT Performed at [facility]    Report Status PENDING  Incomplete  Culture, blood (routine x 2)     Status: None (Preliminary result)   Collection Time: 08/03/14  2:30 AM  Result Value Ref Range Status   Specimen Description BLOOD BLOOD LEFT FOREARM  Final   Special Requests BOTTLES DRAWN AEROBIC AND ANAEROBIC 5CC EACH  Final   Culture   Final           BLOOD CULTURE RECEIVED NO GROWTH TO DATE CULTURE WILL BE HELD FOR 5 DAYS BEFORE ISSUING A FINAL NEGATIVE REPORT Performed at [facility]    Report Status PENDING  Incomplete  MRSA PCR Screening     Status: Abnormal   Collection Time: 08/03/14  4:50 AM  Result Value Ref Range Status   MRSA by PCR SUBOPTIMAL SPECIMEN (A) NEGATIVE Final    Comment: NOTIFIED CHRIS,RN AT 0521 ON [id number] SKEEN,P SPECIMEN CREDITED   MRSA PCR Screening     Status: None   Collection Time: 08/03/14  5:45 AM  Result Value Ref Range Status   MRSA by PCR NEGATIVE NEGATIVE Final    Comment:        The GeneXpert MRSA Assay (FDA approved for NASAL specimens only), is one component of a comprehensive MRSA colonization surveillance program. It is not intended to diagnose MRSA infection nor to guide or monitor treatment for MRSA infections.      Labs: Basic Metabolic Panel:  Recent Labs Lab 08/01/14 1453 08/01/14 1505 08/02/14 2146 08/04/14 0403 08/05/14 0317  NA 132* 134* 133* 134* 132*  K 3.9 3.9 3.8 4.1 4.3  CL 98* 94* 96* 98* 93*  CO2 27  --  25 26 29   GLUCOSE 372* 395* 267* 168* 224*  BUN 18 20 23* 26* 27*  CREATININE 1.17 1.10 1.40* 1.20 1.33*   CALCIUM 8.0*  --  8.5* 8.1* 8.2*   Liver Function Tests:  Recent Labs Lab 08/01/14 1453 08/02/14 2146  AST 15 19  ALT 11* 12*  ALKPHOS 102 124  BILITOT 1.0 1.3*  PROT 5.8* 6.8  ALBUMIN 2.7* 3.2*   No results for input(s): LIPASE, AMYLASE in the last 168 hours. No results for input(s): AMMONIA in the last 168 hours. CBC:  Recent Labs Lab 08/01/14 1453 08/01/14 1505 08/02/14 2146  WBC 7.4  --  8.3  NEUTROABS  --   --  5.5  HGB 9.4*  11.9* 10.6*  HCT 33.1* 35.0* 37.3*  MCV 72.1*  --  71.6*  PLT 187  --  233   Cardiac Enzymes:  Recent Labs Lab 08/03/14 0520 08/03/14 1048 08/03/14 1530  TROPONINI 0.03 0.03 0.04*   BNP: BNP (last 3 results)  Recent Labs  08/01/14 1453  BNP 960.6*    ProBNP (last 3 results) No results for input(s): PROBNP in the last 8760 hours.  CBG:  Recent Labs Lab 08/04/14 1137 08/04/14 1627 08/04/14 2108 08/05/14 0727 08/05/14 1211  GLUCAP 226* 207* 212* 210* 175*       Signed:  Dontrey Snellgrove A  [facility] 08/05/2014, 1:44 PM

## 2014-08-05 NOTE — Progress Notes (Signed)
Pt requesting to leave AMA. Explained to patient that for his health and safety, we do not recommend leaving. Pt continued to express his desire to leave and declined to speak with a provider. I encouraged the patient to stay and allow RN to administer next dose of IV antibiotics and we would re-evaluate his leaving at the time. Patient in agreement and has chosen to stay.

## 2014-08-05 NOTE — Progress Notes (Signed)
Pt called me into the room to tell me he "had to leave to take care of business." After sitting down to talk to him, he stated that he has been staying in the back of a mechanic shop this week and the people who run the place are putting his stuff outside today and he needed to get it. He said he found another place to stay for a few days on the corner of [address] and [address] where if he got there today, he could stay for a few days. He had a ride to help him get his stuff moved and needed to go to take care of that since his children do not help him. He stated he didn't want to see a doctor to convince him to stay. I told him I had to inform his doctors and he stated that was fine but he did not have a desire to see them, he just wanted to leave. I educated him on the severity of his condition with PNA and CHF and he stated he knew he was sick but he had to have a place set up for a living situation today and his medical condition was not going to stop him from leaving. I paged and spoke with Dr. Arthor Captain and informed him of pt's desire to leave. I came back to talk to him, one more time to encourage him to stay which was unsuccessful. I did convince him to stay for his afternoon antibiotics and lasix. He agreed to do that. Will continue to monitor pt

## 2014-08-07 LAB — CULTURE, BLOOD (ROUTINE X 2): Culture: NO GROWTH

## 2014-08-09 LAB — CULTURE, BLOOD (ROUTINE X 2)
CULTURE: NO GROWTH
Culture: NO GROWTH

## 2014-08-10 ENCOUNTER — Telehealth: Payer: Self-pay | Admitting: Internal Medicine

## 2014-08-10 NOTE — Telephone Encounter (Signed)
08/10/14 left vm to sched Dr. Ladona Ridgel recall; ah

## 2014-08-11 ENCOUNTER — Ambulatory Visit: Payer: Medicaid Other

## 2014-08-18 ENCOUNTER — Encounter (HOSPITAL_COMMUNITY): Payer: Self-pay | Admitting: *Deleted

## 2014-08-18 ENCOUNTER — Inpatient Hospital Stay (HOSPITAL_COMMUNITY)
Admission: EM | Admit: 2014-08-18 | Discharge: 2014-08-20 | DRG: 293 | Disposition: A | Discharge status: Home / Self Care | Payer: Medicaid Other | Attending: Cardiology | Admitting: Cardiology

## 2014-08-18 ENCOUNTER — Emergency Department (HOSPITAL_COMMUNITY): Payer: Medicaid Other

## 2014-08-18 DIAGNOSIS — Z7901 Long term (current) use of anticoagulants: Secondary | ICD-10-CM

## 2014-08-18 DIAGNOSIS — Z951 Presence of aortocoronary bypass graft: Secondary | ICD-10-CM | POA: Diagnosis not present

## 2014-08-18 DIAGNOSIS — Z9119 Patient's noncompliance with other medical treatment and regimen: Secondary | ICD-10-CM

## 2014-08-18 DIAGNOSIS — I252 Old myocardial infarction: Secondary | ICD-10-CM

## 2014-08-18 DIAGNOSIS — I10 Essential (primary) hypertension: Secondary | ICD-10-CM | POA: Diagnosis present

## 2014-08-18 DIAGNOSIS — I739 Peripheral vascular disease, unspecified: Secondary | ICD-10-CM | POA: Diagnosis present

## 2014-08-18 DIAGNOSIS — E780 Pure hypercholesterolemia: Secondary | ICD-10-CM | POA: Diagnosis present

## 2014-08-18 DIAGNOSIS — I255 Ischemic cardiomyopathy: Secondary | ICD-10-CM | POA: Diagnosis present

## 2014-08-18 DIAGNOSIS — E1151 Type 2 diabetes mellitus with diabetic peripheral angiopathy without gangrene: Secondary | ICD-10-CM | POA: Diagnosis present

## 2014-08-18 DIAGNOSIS — E785 Hyperlipidemia, unspecified: Secondary | ICD-10-CM | POA: Diagnosis present

## 2014-08-18 DIAGNOSIS — Z590 Homelessness: Secondary | ICD-10-CM

## 2014-08-18 DIAGNOSIS — I2510 Atherosclerotic heart disease of native coronary artery without angina pectoris: Secondary | ICD-10-CM | POA: Diagnosis present

## 2014-08-18 DIAGNOSIS — F17210 Nicotine dependence, cigarettes, uncomplicated: Secondary | ICD-10-CM | POA: Diagnosis present

## 2014-08-18 DIAGNOSIS — Z720 Tobacco use: Secondary | ICD-10-CM | POA: Diagnosis present

## 2014-08-18 DIAGNOSIS — I5043 Acute on chronic combined systolic (congestive) and diastolic (congestive) heart failure: Principal | ICD-10-CM | POA: Diagnosis present

## 2014-08-18 DIAGNOSIS — D638 Anemia in other chronic diseases classified elsewhere: Secondary | ICD-10-CM | POA: Diagnosis present

## 2014-08-18 DIAGNOSIS — Z95810 Presence of automatic (implantable) cardiac defibrillator: Secondary | ICD-10-CM

## 2014-08-18 DIAGNOSIS — I482 Chronic atrial fibrillation: Secondary | ICD-10-CM | POA: Diagnosis present

## 2014-08-18 DIAGNOSIS — R079 Chest pain, unspecified: Secondary | ICD-10-CM

## 2014-08-18 DIAGNOSIS — Z91199 Patient's noncompliance with other medical treatment and regimen due to unspecified reason: Secondary | ICD-10-CM

## 2014-08-18 DIAGNOSIS — I5042 Chronic combined systolic (congestive) and diastolic (congestive) heart failure: Secondary | ICD-10-CM | POA: Diagnosis present

## 2014-08-18 DIAGNOSIS — I509 Heart failure, unspecified: Secondary | ICD-10-CM | POA: Diagnosis present

## 2014-08-18 DIAGNOSIS — I4891 Unspecified atrial fibrillation: Secondary | ICD-10-CM | POA: Diagnosis present

## 2014-08-18 LAB — I-STAT TROPONIN, ED: TROPONIN I, POC: 0.01 ng/mL (ref 0.00–0.08)

## 2014-08-18 LAB — BASIC METABOLIC PANEL
ANION GAP: 9 (ref 5–15)
BUN: 20 mg/dL (ref 6–20)
CALCIUM: 8.7 mg/dL — AB (ref 8.9–10.3)
CO2: 28 mmol/L (ref 22–32)
CREATININE: 1.05 mg/dL (ref 0.61–1.24)
Chloride: 99 mmol/L — ABNORMAL LOW (ref 101–111)
GFR calc non Af Amer: >60 mL/min (ref >60)
Glucose, Bld: 154 mg/dL — ABNORMAL HIGH (ref 65–99)
Potassium: 4.6 mmol/L (ref 3.5–5.1)
Sodium: 136 mmol/L (ref 135–145)

## 2014-08-18 LAB — BRAIN NATRIURETIC PEPTIDE: B Natriuretic Peptide: 670.1 pg/mL — ABNORMAL HIGH (ref 0.0–100.0)

## 2014-08-18 LAB — CBC WITH DIFFERENTIAL/PLATELET
Basophils Absolute: 0.1 10*3/uL (ref 0.0–0.1)
Basophils Relative: 1 % (ref 0–1)
EOS ABS: 0.2 10*3/uL (ref 0.0–0.7)
EOS PCT: 2 % (ref 0–5)
HCT: 36.5 % — ABNORMAL LOW (ref 39.0–52.0)
Hemoglobin: 10.5 g/dL — ABNORMAL LOW (ref 13.0–17.0)
LYMPHS PCT: 20 % (ref 12–46)
Lymphs Abs: 1.6 10*3/uL (ref 0.7–4.0)
MCH: 20.3 pg — ABNORMAL LOW (ref 26.0–34.0)
MCHC: 28.8 g/dL — AB (ref 30.0–36.0)
MCV: 70.7 fL — AB (ref 78.0–100.0)
MONOS PCT: 8 % (ref 3–12)
Monocytes Absolute: 0.6 10*3/uL (ref 0.1–1.0)
NEUTROS ABS: 5.3 10*3/uL (ref 1.7–7.7)
Neutrophils Relative %: 69 % (ref 43–77)
PLATELETS: 226 10*3/uL (ref 150–400)
RBC: 5.16 MIL/uL (ref 4.22–5.81)
RDW: 20.4 % — AB (ref 11.5–15.5)
WBC: 7.8 10*3/uL (ref 4.0–10.5)

## 2014-08-18 LAB — TROPONIN I: TROPONIN I: 0.03 ng/mL (ref <0.031)

## 2014-08-18 MED ORDER — GLIPIZIDE 5 MG PO TABS
5.0000 mg | ORAL_TABLET | Freq: Two times a day (BID) | ORAL | Status: DC
  Administered 2014-08-19 – 2014-08-20 (×3): 5 mg via ORAL
  Filled 2014-08-18 (×7): qty 1

## 2014-08-18 MED ORDER — SODIUM CHLORIDE 0.9 % IJ SOLN
3.0000 mL | Freq: Two times a day (BID) | INTRAMUSCULAR | Status: DC
  Administered 2014-08-18 – 2014-08-20 (×4): 3 mL via INTRAVENOUS

## 2014-08-18 MED ORDER — ACETAMINOPHEN 325 MG PO TABS: 650.0000 mg | ORAL_TABLET | ORAL | Status: DC | PRN

## 2014-08-18 MED ORDER — ZOLPIDEM TARTRATE 5 MG PO TABS
5.0000 mg | ORAL_TABLET | Freq: Every evening | ORAL | Status: DC | PRN
  Administered 2014-08-19: 5 mg via ORAL
  Filled 2014-08-18: qty 1

## 2014-08-18 MED ORDER — SODIUM CHLORIDE 0.9 % IJ SOLN: 3.0000 mL | INTRAMUSCULAR | Status: DC | PRN

## 2014-08-18 MED ORDER — ONDANSETRON HCL 4 MG/2ML IJ SOLN: 4.0000 mg | Freq: Four times a day (QID) | INTRAMUSCULAR | Status: DC | PRN

## 2014-08-18 MED ORDER — NITROGLYCERIN 0.4 MG SL SUBL: 0.4000 mg | SUBLINGUAL_TABLET | SUBLINGUAL | Status: DC | PRN

## 2014-08-18 MED ORDER — DILTIAZEM HCL 25 MG/5ML IV SOLN
10.0000 mg | Freq: Once | INTRAVENOUS | Status: AC
  Administered 2014-08-18: 10 mg via INTRAVENOUS
  Filled 2014-08-18: qty 5

## 2014-08-18 MED ORDER — DILTIAZEM HCL 100 MG IV SOLR: 5.0000 mg/h | Freq: Once | INTRAVENOUS | Status: DC

## 2014-08-18 MED ORDER — RIVAROXABAN 20 MG PO TABS
20.0000 mg | ORAL_TABLET | Freq: Every day | ORAL | Status: DC
  Administered 2014-08-18 – 2014-08-19 (×2): 20 mg via ORAL
  Filled 2014-08-18 (×3): qty 1

## 2014-08-18 MED ORDER — SPIRONOLACTONE 25 MG PO TABS
25.0000 mg | ORAL_TABLET | Freq: Every day | ORAL | Status: DC
  Administered 2014-08-19: 25 mg via ORAL
  Filled 2014-08-18 (×2): qty 1

## 2014-08-18 MED ORDER — FUROSEMIDE 10 MG/ML IJ SOLN
40.0000 mg | Freq: Two times a day (BID) | INTRAMUSCULAR | Status: DC
  Administered 2014-08-18 – 2014-08-19 (×2): 40 mg via INTRAVENOUS
  Filled 2014-08-18 (×4): qty 4

## 2014-08-18 MED ORDER — SODIUM CHLORIDE 0.9 % IV SOLN: 250.0000 mL | INTRAVENOUS | Status: DC | PRN

## 2014-08-18 MED ORDER — CARVEDILOL 25 MG PO TABS
25.0000 mg | ORAL_TABLET | Freq: Two times a day (BID) | ORAL | Status: DC
  Administered 2014-08-18 – 2014-08-19 (×3): 25 mg via ORAL
  Filled 2014-08-18 (×5): qty 1

## 2014-08-18 MED ORDER — ALPRAZOLAM 0.25 MG PO TABS: 0.2500 mg | ORAL_TABLET | Freq: Two times a day (BID) | ORAL | Status: DC | PRN

## 2014-08-18 NOTE — ED Notes (Signed)
Pt reports taking Xeralto but ran out last week

## 2014-08-18 NOTE — H&P (Signed)
Physician Signed Cardiology Consult Note 08/18/2014 3:20 PM    Expand All Collapse All      Patient ID: Jacob [name] MRN: [id number], DOB/AGE: 1956/01/28   Admit date: 08/18/2014   Primary Physician: [name], MD Primary Cardiologist: Dr. Allyson Sabal Electrophysiologist: Dr. Ladona Ridgel  Pt. Profile:  59 y/o male with known CAD s/p CABG, chronic systolic CHF, ischemic cardiomyopathy s/p ICD, PVD, HTN, HLD and DM, presenting with exertional chest pain and dyspnea.   Problem List  Past Medical History  Diagnosis Date  . Hypertension   . Systolic heart failure     EF is 40-45% by echo, December 2013  . Noncompliance   . CAD (coronary artery disease) Sept 2013    s/p cardiac cath showing occlusion of small RCA with collaterals  . Chronic anticoagulation     on coumadin  . Atrial fibrillation   . Peripheral arterial disease   . Automatic implantable cardioverter-defibrillator in situ   . High cholesterol   . CHF (congestive heart failure)   . Heart murmur   . Myocardial infarction 2014  . Type II diabetes mellitus     Past Surgical History  Procedure Laterality Date  . Implantable cardioverter defibrillator implant      [place] in 07/2012; [employer]  . Coronary artery bypass graft  09/2012    2 vessels per patient [name]   . Coronary angioplasty with stent placement  11/2011    "1"  . Cardiac catheterization  09/2012  . Iliac artery stent Right 08/30/2013  . Left and right heart catheterization with coronary angiogram N/A 12/02/2011    Procedure: LEFT AND RIGHT HEART CATHETERIZATION WITH CORONARY ANGIOGRAM; Surgeon: Kathleene Hazel, MD; Location: [facility] CATH LAB; Service: Cardiovascular; Laterality: N/A;  . Lower extremity angiogram N/A 08/30/2013    Procedure: LOWER EXTREMITY ANGIOGRAM; Surgeon: Runell Gess, MD; Location: [facility] CATH LAB; Service:  Cardiovascular; Laterality: N/A;  . Lower extremity angiogram N/A 12/02/2013    Procedure: LOWER EXTREMITY ANGIOGRAM; Surgeon: Runell Gess, MD; Location: [facility] CATH LAB; Service: Cardiovascular; Laterality: N/A;     Allergies  No Known Allergies  HPI  The patient is a 59 y/o male, followed by Dr. Allyson Sabal. He is from [place]. Has been living in the [place] for over 20 years. He is homeless with no close relatives. He has a history of CAD status post CABG x2 at [facility] April 2013. He has severe ischemic cardiomyopathy (EF 20-25% 04/06/13) and had an ICD implantation in [place] in 2013. This is now followed by Dr. Ladona Ridgel. Other problems include a long history of tobacco abuse, hypertension, hyperlipidemia and diabetes. He does have chronic A. Fib on oral anticoagulation with Xarelto. He has severe PVD with bilateral SFA occlusions. 12/02/13 he underwent PV angio by Dr. Allyson Sabal but did not have favorable anatomy suitable for percutaneous intervention. He is felt to be high risk for surgical revascularization, thus medical therapy was elected.   He was recently admitted 08/03/14 for acute respiratory failure in the setting of acute on chronic systolic CHF and CAP, however left AMA on 08/05/14. During that time, device interrogation revealed no recent shocks or ATP, <1 V pacing, lots of NSVT, however no persistent ventricular ectopy. 2D echo 5/11 showed persistent LV dysfunction with EF at 20-25%. No ischemic w/u conducted.   He presents back to the [facility] ER today with complaints of a 2 day history of intermittent chest pain and 1 month h/o progressive DOE, orthopnea and PND. Chest pain feels similar  to prior angina before CABG. Described as left sided tightness. Exacerbated by exertion. Relieved with rest. He admits that he has not been fully compliant with his meds. He ran out of Xarelto, lisinopril and Lipitor 1 week ago. He reports compliance with coreg, lasix, spironolactone and glipizide.  Denies high sodium foods. He is currently pain free. EKG shows atrial fibrillation w/ RVR. HR in the 120s. Telemetry now shows slower rate in the 90s-low 100s. Diastolic BP is elevated in the 110s. BNP elevated at 670 (960 2 weeks ago). CXR shows small right pleural effusion. In ER, he has received SL NTG and 10 mg of IV Cardizem x 1.    Home Medications  Prior to Admission medications   Medication Sig Start Date End Date Taking? Authorizing Provider  carvedilol (COREG) 25 MG tablet Take 1 tablet (25 mg total) by mouth 2 (two) times daily. 04/12/14  Yes [name], MD  furosemide (LASIX) 40 MG tablet Take 1 tablet (40 mg total) by mouth daily. 04/12/14  Yes [name], MD  glipiZIDE (GLUCOTROL) 5 MG tablet Take 1 tablet (5 mg total) by mouth 2 (two) times daily before a meal. 04/12/14  Yes [name], MD  rivaroxaban (XARELTO) 20 MG TABS tablet Take 1 tablet (20 mg total) by mouth daily before supper. 04/12/14  Yes [name], MD  spironolactone (ALDACTONE) 25 MG tablet Take 1 tablet (25 mg total) by mouth daily. 04/12/14  Yes [name], MD  levofloxacin (LEVAQUIN) 500 MG tablet Take 1 tablet (500 mg total) by mouth daily. Patient not taking: Reported on 08/18/2014 08/01/14   Alison Murray, MD  lisinopril (ZESTRIL) 2.5 MG tablet Take 1 tablet (2.5 mg total) by mouth daily. 04/12/14   [name], MD    Family History  Family History  Problem Relation Age of Onset  . Diabetes Mother     Social History  History   Social History  . Marital Status: Divorced    Spouse Name: N/A  . Number of Children: 3  . Years of Education: N/A   Occupational History  . Unemployed    Social History Main Topics  . Smoking status: Current Every Day Smoker -- 0.25 packs/day for 38 years    Types: Cigarettes  . Smokeless tobacco: Never Used  . Alcohol Use:  No  . Drug Use: No  . Sexual Activity: Yes   Other Topics Concern  . Not on file   Social History Narrative   Has an apartment with a roommate. He was living on the streets in 01-14-2013. He reports that his father died in [place] in 01-14-2013. He is divorced. He is no longer estranged from his son, but still from his daughter who lives locally. Neither of his parents, nor any siblings have any history of CAD.     Review of Systems General: No chills, fever, night sweats or weight changes.  Cardiovascular: No chest pain, dyspnea on exertion, edema, orthopnea, palpitations, paroxysmal nocturnal dyspnea. Dermatological: No rash, lesions/masses Respiratory: No cough, dyspnea Urologic: No hematuria, dysuria Abdominal: No nausea, vomiting, diarrhea, bright red blood per rectum, melena, or hematemesis Neurologic: No visual changes, wkns, changes in mental status. All other systems reviewed and are otherwise negative except as noted above.  Physical Exam  Blood pressure 113/78, pulse 93, temperature 97.9 F (36.6 C), temperature source Oral, resp. rate 17, weight 229 lb 9.6 oz (104.146 kg), SpO2 97 %.  General: Pleasant, NAD Psych: Normal  affect. Neuro: Alert and oriented X 3. Moves all extremities spontaneously. HEENT: Normal Neck: Supple without bruits or JVD. Lungs: Resp regular and unlabored, bilateral diffuse rhonchi and wheezing. Heart: Irregularly irregular no s3, s4, or murmurs. Abdomen: Soft, non-tender, non-distended, BS + x 4.  Extremities: No clubbing or cyanosis, 1+ bilateral LEE. Decreased DP/PT. Radials 2+ and equal bilaterally.  Labs  Troponin (Point of Care Test)  Recent Labs (last 2 labs)      Recent Labs  08/18/14 1401  TROPIPOC 0.01      Recent Labs (last 2 labs)     No results for input(s): CKTOTAL, CKMB, TROPONINI in the last 72 hours.   Lab Results  Component Value Date   WBC 7.8 08/18/2014    HGB 10.5* 08/18/2014   HCT 36.5* 08/18/2014   MCV 70.7* 08/18/2014   PLT 226 08/18/2014    Recent Labs Lab 08/18/14 1350  NA 136  K 4.6  CL 99*  CO2 28  BUN 20  CREATININE 1.05  CALCIUM 8.7*  GLUCOSE 154*    Recent Labs    Lab Results  Component Value Date   CHOL 80 08/05/2014   HDL 18* 08/05/2014   LDLCALC 52 08/05/2014   TRIG 51 08/05/2014      Recent Labs    No results found for: DDIMER    Radiology/Studies   Imaging Results    Dg Chest 2 View  08/18/2014 CLINICAL DATA: Left side chest pain EXAM: CHEST 2 VIEW COMPARISON: 05/12/2013 and 08/01/2014 FINDINGS: Borderline cardiomegaly. There is small right pleural effusion with chronic blunting of the right costophrenic angle. No acute infiltrate or pulmonary edema. Status post median sternotomy. Stable single lead cardiac pacemaker position IMPRESSION: Small right pleural effusion with chronic blunting of the right costophrenic angle. No acute infiltrate or pulmonary edema. Stable single lead cardiac pacemaker position. Electronically Signed By: Natasha Mead M.D. On: 08/18/2014 14:40   Dg Chest Port 1 View  08/01/2014 CLINICAL DATA: Left chest pain. Productive cough. EXAM: PORTABLE CHEST - 1 VIEW COMPARISON: 05/12/2013. FINDINGS: Stable enlarged cardiac silhouette and median sternotomy wires. Small right pleural effusion without significant change. Small amount of adjacent airspace opacity with progression. Diffusely prominent pulmonary vasculature and interstitial markings with improvement. Stable left subclavian pacemaker. The distal portion of the lead is not clearly visualized due to overlapping soft tissues. Unremarkable bones. IMPRESSION: 1. Stable small right pleural effusion. 2. Mild increase in patchy opacity at the right lung base. This could represent atelectasis or pneumonia. 3. Stable cardiomegaly with improved changes of congestive heart  failure. Electronically Signed By: Beckie Salts M.D. On: 08/01/2014 15:23     ECG  Atrial fibrillation, HR in the 120s. Telemetry shows afib in the 90s-100s   ASSESSMENT AND PLAN  1. Chest Pain/CAD: h/o CABG. Currently CP free. Initial POC troponin is negative. EKG shows chronic afib. Chest pain may be secondary to acute on chronic CHF and/or hypertension. However, recommend cycling cardiac enzymes to r/o ACS. Continue on tele. Resume ASA, BB, statin and ACE. PRN SL NTG. Will determine need for ischemic eval based on troponin results and response to therapy for acute CHF.   2. Acute on Chronic Systolic CHF: in the setting of medical noncompliance. BNP elevated. CXR shows right pleural effusion. He has peripheral edema on exam. Recent 2D echo showed no change in systolic function (still 20-25%). Treat with IV lasix. Strict I/Os, daily weights, low sodium diet, BB, ACE-I and spironolactone.   3. Ischemic Cardiomyopathy: EF 20-25%. Has  ICD. Recent device interrogation 5/11 showed no recent shock or ATP, <1 V pacing, lots of NSVT, however no persistent ventricular ectopy. Resume BB, ACE and aldosterone antagonist.   4. Chronic Atrial Fibrillation: rate is better controlled after dose of IV Cardizem. Continue PO BB. Restart Xarelto. Keep on telemetry.  5. Recent h/o CAP: was diagnosed 2 weeks ago but left AMA. Still with wheezing and rhonchi on exam. CXR negative for infiltrate. WBC normal. Afebrile.   6. Tobacco abuse: smoking cessation strongly advised.  7. DM: management per IM.  8. PVD: bilateral SFA occlusion. Not a percutaneous or surgical candidate. Continue medical therapy.   9. Medical Noncompliance: has difficulties with med and is homeless. Will need social work consult for medication and placement needs.     Signed, Robbie Lis, PA-C 08/18/2014, 3:20 PM  Cardiologist-Dr Allyson Sabal As above, patient seen and examined. Briefly he is a 59 year old male with a past  medical history of noncompliance, coronary artery disease status post prior coronary artery bypass graft, ischemic cardiomyopathy, chronic systolic congestive heart failure, permanent atrial fibrillation, peripheral vascular disease, tobacco abuse, diabetes mellitus with atypical chest pain and acute on chronic systolic congestive heart failure. Patient states that over the past 24 hours he has had intermittent left-sided chest pain. It lasts 20 seconds and resolves. It occurs both at rest and with exertion. He also notes increasing dyspnea on exertion and pedal edema. He has not taken his medicines in approximately 1 week. Plan to admit and rule out myocardial infarction with serial enzymes. If enzymes negative would not pursue further ischemia evaluation. He is volume overloaded on examination. Would treat with diuretics and follow renal function. Resume beta blocker, spironolactone and ACE inhibitor as tolerated by blood pressure. For atrial fibrillation would resume beta blocker for rate control. Continue xarelto. Needs social work consult and [facility] possibly to see to assist with medications and outpt FU. Olga Millers

## 2014-08-18 NOTE — ED Notes (Signed)
Pt in from place of business via [facility], per report pt c/o L sided non radiating CP onset today, pt c/o SOB, denies N/V/D, pt rcvd 324 mg ASA pta, x 1 SL with BP dropping from 140 sys to 93  Sys, pt A&O x4, hx of CABG x 1 yrs ago, A&O x4, pt in A fib in route with irregular rate around 120

## 2014-08-18 NOTE — ED Notes (Signed)
Attempted to call report

## 2014-08-18 NOTE — ED Notes (Signed)
Attempted to call report to floor.  Floor unavailable to take report at this time.

## 2014-08-18 NOTE — ED Provider Notes (Signed)
CSN: [id number]     Arrival date & time 08/18/14  1334 History   First MD Initiated Contact with Patient 08/18/14 1354     Chief Complaint  Patient presents with  . Chest Pain     (Consider location/radiation/quality/duration/timing/severity/associated sxs/prior Treatment) HPI  Pt with hx of afib, CHF, CAD presenting with c/o chest pain.  Pt states the pain is located in left chest, has been intermittent and began earlier today.  Pain is described as aching and similar to his prior anginal chest pain.  No difficulty breathing.  No nausea or diaphoresis.  Pt was recently discharged from hospital after CHF exacerbation with pneumonia- he states that since discharge he has not been able to get any of his meds filled.  Today denies significant cough, does feel legs are more swollen.  There are no other associated systemic symptoms, there are no other alleviating or modifying factors.   Past Medical History  Diagnosis Date  . Hypertension   . Systolic heart failure     EF is 40-45% by echo, December 2013  . Noncompliance   . CAD (coronary artery disease) Sept 2013    s/p cardiac cath showing occlusion of small RCA with collaterals  . Chronic anticoagulation     on coumadin  . Atrial fibrillation   . Peripheral arterial disease   . Automatic implantable cardioverter-defibrillator in situ   . High cholesterol   . Myocardial infarction 2014  . Type II diabetes mellitus    Past Surgical History  Procedure Laterality Date  . Implantable cardioverter defibrillator implant      [place] in 07/2012; [employer]  . Coronary artery bypass graft  09/2012    2 vessels per patient [name]   . Coronary angioplasty with stent placement  11/2011    "1"  . Cardiac catheterization  09/2012  . Iliac artery stent Right 08/30/2013  . Left and right heart catheterization with coronary angiogram N/A 12/02/2011    Procedure: LEFT AND RIGHT HEART CATHETERIZATION WITH CORONARY ANGIOGRAM;  Surgeon:  Kathleene Hazel, MD;  Location[facility] CATH LAB;  Service: Cardiovascular;  Laterality: N/A;  . Lower extremity angiogram N/A 08/30/2013    Procedure: LOWER EXTREMITY ANGIOGRAM;  Surgeon: Runell Gess, MD;  Location: [facility] CATH LAB;  Service: Cardiovascular;  Laterality: N/A;  . Lower extremity angiogram N/A 12/02/2013    Procedure: LOWER EXTREMITY ANGIOGRAM;  Surgeon: Runell Gess, MD;  Location: [facility] CATH LAB;  Service: Cardiovascular;  Laterality: N/A;   Family History  Problem Relation Age of Onset  . Diabetes Mother    History  Substance Use Topics  . Smoking status: Current Every Day Smoker -- 0.25 packs/day for 38 years    Types: Cigarettes  . Smokeless tobacco: Never Used  . Alcohol Use: No    Review of Systems  ROS reviewed and all otherwise negative except for mentioned in HPI   Allergies  Review of patient's allergies indicates no known allergies.  Home Medications   Prior to Admission medications   Medication Sig Start Date End Date Taking? Authorizing Provider  carvedilol (COREG) 25 MG tablet Take 1 tablet (25 mg total) by mouth 2 (two) times daily. 04/12/14  Yes [name], MD  furosemide (LASIX) 40 MG tablet Take 1 tablet (40 mg total) by mouth daily. 04/12/14  Yes [name], MD  glipiZIDE (GLUCOTROL) 5 MG tablet Take 1 tablet (5 mg total) by mouth 2 (two) times daily before a meal. 04/12/14  Yes  [name], MD  rivaroxaban (XARELTO) 20 MG TABS tablet Take 1 tablet (20 mg total) by mouth daily before supper. 04/12/14  Yes [name], MD  spironolactone (ALDACTONE) 25 MG tablet Take 1 tablet (25 mg total) by mouth daily. 04/12/14  Yes [name], MD  levofloxacin (LEVAQUIN) 500 MG tablet Take 1 tablet (500 mg total) by mouth daily. Patient not taking: Reported on 08/18/2014 08/01/14   Alison Murray, MD  lisinopril (ZESTRIL) 2.5 MG tablet Take 1 tablet (2.5 mg total) by mouth daily. 04/12/14   [name], MD   BP  118/84 mmHg  Pulse 91  Temp(Src) 98 F (36.7 C) (Oral)  Resp 18  Ht  (1.626 m)  Wt 227 lb 1.6 oz (103.012 kg)  BMI 38.96 kg/m2  SpO2 95%  Vitals reviewed Physical Exam  Physical Examination: General appearance - alert, well appearing, and in no distress Mental status - alert, oriented to person, place, and time Eyes - no conjunctival injection, no scleral icterus Mouth - mucous membranes moist, pharynx normal without lesions Chest - clear to auscultation, no wheezes, rales or rhonchi, symmetric air entry, normal respiratory effort Heart - normal rate, regular rhythm, normal S1, S2, no murmurs, rubs, clicks or gallops Abdomen - soft, nontender, nondistended, no masses or organomegaly Extremities - peripheral pulses normal, 1+  pedal edema, no clubbing or cyanosis Skin - normal coloration and turgor, no rashes  ED Course  Procedures (including critical care time)  CRITICAL CARE Performed by: Ethelda Chick Total critical care time: 31 Critical care time was exclusive of separately billable procedures and treating other patients. Critical care was necessary to treat or prevent imminent or life-threatening deterioration. Critical care was time spent personally by me on the following activities: development of treatment plan with patient and/or surrogate as well as nursing, discussions with consultants, evaluation of patient's response to treatment, examination of patient, obtaining history from patient or surrogate, ordering and performing treatments and interventions, ordering and review of laboratory studies, ordering and review of radiographic studies, pulse oximetry and re-evaluation of patient's condition.   Labs Review Labs Reviewed  BASIC METABOLIC PANEL - Abnormal; Notable for the following:    Chloride 99 (*)    Glucose, Bld 154 (*)    Calcium 8.7 (*)    All other components within normal limits  BRAIN NATRIURETIC PEPTIDE - Abnormal; Notable for the following:    B  Natriuretic Peptide 670.1 (*)    All other components within normal limits  CBC WITH DIFFERENTIAL/PLATELET - Abnormal; Notable for the following:    Hemoglobin 10.5 (*)    HCT 36.5 (*)    MCV 70.7 (*)    MCH 20.3 (*)    MCHC 28.8 (*)    RDW 20.4 (*)    All other components within normal limits  COMPREHENSIVE METABOLIC PANEL - Abnormal; Notable for the following:    Sodium 134 (*)    Chloride 95 (*)    Glucose, Bld 275 (*)    BUN 24 (*)    Creatinine, Ser 1.29 (*)    Calcium 8.3 (*)    Albumin 3.1 (*)    ALT 13 (*)    Alkaline Phosphatase 147 (*)    GFR calc non Af Amer 60 (*)    All other components within normal limits  TROPONIN I  TROPONIN I  TROPONIN I  Rosezena Sensor, ED    Imaging Review Dg Chest 2 View  08/18/2014   CLINICAL  DATA:  Left side chest pain  EXAM: CHEST  2 VIEW  COMPARISON:  05/12/2013 and 08/01/2014  FINDINGS: Borderline cardiomegaly. There is small right pleural effusion with chronic blunting of the right costophrenic angle. No acute infiltrate or pulmonary edema. Status post median sternotomy. Stable single lead cardiac pacemaker position  IMPRESSION: Small right pleural effusion with chronic blunting of the right costophrenic angle. No acute infiltrate or pulmonary edema. Stable single lead cardiac pacemaker position.   Electronically Signed   By: Natasha Mead M.D.   On: 08/18/2014 14:40     EKG Interpretation   Date/Time:  Thursday Aug 18 2014 13:43:20 EDT Ventricular Rate:  121 PR Interval:    QRS Duration: 100 QT Interval:  355 QTC Calculation: 504 R Axis:   -27 Text Interpretation:  Atrial fibrillation Borderline left axis deviation  Nonspecific T abnormalities, lateral leads Prolonged QT interval No  significant change since last tracing Confirmed by [facility]  MD, MARTHA  [phone]) on 08/18/2014 2:08:55 PM      MDM   Final diagnoses:  Chest pain, unspecified chest pain type    Pt with hx of CHF, CAD presenting with chest pain similar to  his prior angina- he states he has not been able to get his medications since discharge from the hospital earlier this week.  Initial troponin negative.  Pt in rapid afib- diltiazem ordered, IV bolus and drip.  D/w cardiology who will come see the patient in consultation.    Jerelyn Scott, MD 08/19/14 1201

## 2014-08-18 NOTE — Progress Notes (Signed)
Jacob [name] [id number] Admission Data: 08/18/2014 8:57 PM Attending Provider: Lewayne Bunting, MD HWE:[id number], Keane Scrape, MD Code Status: Full  Jacob [name] is a 59 y.o. male patient admitted from ED:  -No acute distress noted.  -No complaints of shortness of breath.  -No complaints of chest pain.   Cardiac Monitoring: Box # 08 in place. Cardiac monitor yields:sinus tachycardia.  Blood pressure 134/77, pulse 105, temperature 98 F (36.7 C), temperature source Oral, resp. rate 20, height 5\' 4"  (1.626 m), weight 104.01 kg (229 lb 4.8 oz), SpO2 96 %.   IV Fluids:  IV in place, occlusive dsg intact without redness, IV cath forearm right, condition patent and no redness none.   Allergies:  Review of patient's allergies indicates no known allergies.  Past Medical History:   has a past medical history of Hypertension; Systolic heart failure; Noncompliance; CAD (coronary artery disease) (Sept 2013); Chronic anticoagulation; Atrial fibrillation; Peripheral arterial disease; Automatic implantable cardioverter-defibrillator in situ; High cholesterol; Myocardial infarction (2014); and Type II diabetes mellitus.  Past Surgical History:   has past surgical history that includes Implantable cardioverter defibrillator implant; Coronary artery bypass graft (09/2012); Coronary angioplasty with stent (11/2011); Cardiac catheterization (09/2012); Iliac artery stent (Right, 08/30/2013); left and right heart catheterization with coronary angiogram (N/A, 12/02/2011); lower extremity angiogram (N/A, 08/30/2013); and lower extremity angiogram (N/A, 12/02/2013).  Social History:   reports that he has been smoking Cigarettes.  He has a 9.5 pack-year smoking history. He has never used smokeless tobacco. He reports that he does not drink alcohol or use illicit drugs.  Skin: charted on CHL  Patient/Family orientated to room. Information packet given to patient/family. Admission inpatient armband information verified with  patient/family to include name and date of birth and placed on patient arm. Side rails up x 2, fall assessment and education completed with patient/family. Patient/family able to verbalize understanding of risk associated with falls and verbalized understanding to call for assistance before getting out of bed. Call light within reach. Patient/family able to voice and demonstrate understanding of unit orientation instructions.

## 2014-08-18 NOTE — Progress Notes (Signed)
Received pt report from Chrislyn,RN-ED.

## 2014-08-18 NOTE — Consult Note (Signed)
Patient ID: Kalvyn Desa MRN: [id number], DOB/AGE: 06-11-1955   Admit date: 08/18/2014   Primary Physician: [name], MD Primary Cardiologist: Dr. Allyson Sabal Electrophysiologist: Dr. Ladona Ridgel  Pt. Profile:  59 y/o male with known CAD s/p CABG, chronic systolic CHF, ischemic cardiomyopathy s/p ICD, PVD, HTN, HLD and DM, presenting with exertional chest pain and dyspnea.   Problem List  Past Medical History  Diagnosis Date  . Hypertension   . Systolic heart failure     EF is 40-45% by echo, December 2013  . Noncompliance   . CAD (coronary artery disease) Sept 2013    s/p cardiac cath showing occlusion of small RCA with collaterals  . Chronic anticoagulation     on coumadin  . Atrial fibrillation   . Peripheral arterial disease   . Automatic implantable cardioverter-defibrillator in situ   . High cholesterol   . CHF (congestive heart failure)   . Heart murmur   . Myocardial infarction 2014  . Type II diabetes mellitus     Past Surgical History  Procedure Laterality Date  . Implantable cardioverter defibrillator implant      [place] in 07/2012; [employer]  . Coronary artery bypass graft  09/2012    2 vessels per patient [name]   . Coronary angioplasty with stent placement  11/2011    "1"  . Cardiac catheterization  09/2012  . Iliac artery stent Right 08/30/2013  . Left and right heart catheterization with coronary angiogram N/A 12/02/2011    Procedure: LEFT AND RIGHT HEART CATHETERIZATION WITH CORONARY ANGIOGRAM;  Surgeon: Kathleene Hazel, MD;  Location: [facility] CATH LAB;  Service: Cardiovascular;  Laterality: N/A;  . Lower extremity angiogram N/A 08/30/2013    Procedure: LOWER EXTREMITY ANGIOGRAM;  Surgeon: Runell Gess, MD;  Location: [facility] CATH LAB;  Service: Cardiovascular;  Laterality: N/A;  . Lower extremity angiogram N/A 12/02/2013    Procedure: LOWER EXTREMITY ANGIOGRAM;  Surgeon: Runell Gess, MD;  Location: [facility] CATH LAB;  Service: Cardiovascular;   Laterality: N/A;     Allergies  No Known Allergies  HPI  The patient is a 59 y/o male, followed by Dr. Allyson Sabal. He is from [place]. Has been living in the [place] for over 20 years. He is homeless with no close relatives. He has a history of CAD status post CABG x2 at [facility] April 2013. He has severe ischemic cardiomyopathy (EF 20-25% 04/06/13) and had an ICD implantation in [place] in 2013. This is now followed by Dr. Ladona Ridgel. Other problems include a long history of tobacco abuse, hypertension, hyperlipidemia and diabetes. He does have chronic A. Fib on oral anticoagulation with Xarelto.  He has severe PVD with bilateral SFA occlusions. 12/02/13 he underwent  PV angio by Dr. Allyson Sabal but did not have favorable anatomy suitable for percutaneous intervention. He is felt to be high risk for surgical revascularization, thus medical therapy was elected.   He was recently admitted 08/03/14 for acute respiratory failure in the setting of acute on chronic systolic CHF and CAP, however left AMA on 08/05/14. During that time, device interrogation revealed no recent shocks or ATP, <1 V pacing, lots of NSVT, however no persistent ventricular ectopy. 2D echo 5/11 showed persistent LV dysfunction with EF at 20-25%. No ischemic w/u conducted.   He presents back to the [facility] ER today with complaints of a 2 day history of intermittent chest pain and 1 month h/o progressive DOE, orthopnea and PND. Chest pain feels similar to prior angina before CABG.  Described as left sided tightness. Exacerbated by exertion. Relieved with rest. He admits that he has not been fully compliant with his meds. He ran out of Xarelto, lisinopril and Lipitor 1 week ago. He reports compliance with coreg, lasix, spironolactone and glipizide. Denies high sodium foods. He is currently pain free. EKG shows atrial fibrillation w/ RVR. HR in the 120s. Telemetry now shows slower rate in the 90s-low 100s. Diastolic BP is elevated in the 110s. BNP elevated at  670 (960 2 weeks ago). CXR shows small right pleural effusion. In ER, he has received SL NTG and 10 mg of IV Cardizem x 1.    Home Medications  Prior to Admission medications   Medication Sig Start Date End Date Taking? Authorizing Provider  carvedilol (COREG) 25 MG tablet Take 1 tablet (25 mg total) by mouth 2 (two) times daily. 04/12/14  Yes [name], MD  furosemide (LASIX) 40 MG tablet Take 1 tablet (40 mg total) by mouth daily. 04/12/14  Yes [name], MD  glipiZIDE (GLUCOTROL) 5 MG tablet Take 1 tablet (5 mg total) by mouth 2 (two) times daily before a meal. 04/12/14  Yes [name], MD  rivaroxaban (XARELTO) 20 MG TABS tablet Take 1 tablet (20 mg total) by mouth daily before supper. 04/12/14  Yes [name], MD  spironolactone (ALDACTONE) 25 MG tablet Take 1 tablet (25 mg total) by mouth daily. 04/12/14  Yes [name], MD  levofloxacin (LEVAQUIN) 500 MG tablet Take 1 tablet (500 mg total) by mouth daily. Patient not taking: Reported on 08/18/2014 08/01/14   Alison Murray, MD  lisinopril (ZESTRIL) 2.5 MG tablet Take 1 tablet (2.5 mg total) by mouth daily. 04/12/14   [name], MD    Family History  Family History  Problem Relation Age of Onset  . Diabetes Mother     Social History  History   Social History  . Marital Status: Divorced    Spouse Name: N/A  . Number of Children: 3  . Years of Education: N/A   Occupational History  . Unemployed    Social History Main Topics  . Smoking status: Current Every Day Smoker -- 0.25 packs/day for 38 years    Types: Cigarettes  . Smokeless tobacco: Never Used  . Alcohol Use: No  . Drug Use: No  . Sexual Activity: Yes   Other Topics Concern  . Not on file   Social History Narrative   Has an apartment with a roommate. He was living on the streets in Dec 28, 2012.  He reports that his father died in [place] in 12-28-2012.  He is divorced.  He is no longer estranged from his son,  but still from his daughter who lives locally.  Neither of his parents, nor any siblings have any history of CAD.     Review of Systems General:  No chills, fever, night sweats or weight changes.  Cardiovascular:  No chest pain, dyspnea on exertion, edema, orthopnea, palpitations, paroxysmal nocturnal dyspnea. Dermatological: No rash, lesions/masses Respiratory: No cough, dyspnea Urologic: No hematuria, dysuria Abdominal:   No nausea, vomiting, diarrhea, bright red blood per rectum, melena, or hematemesis Neurologic:  No visual changes, wkns, changes in mental status. All other systems reviewed and are otherwise negative except as noted above.  Physical Exam  Blood pressure 113/78, pulse 93, temperature 97.9 F (36.6 C), temperature source Oral, resp. rate 17, weight 229 lb 9.6 oz (104.146 kg), SpO2 97 %.  General:  Pleasant, NAD Psych: Normal affect. Neuro: Alert and oriented X 3. Moves all extremities spontaneously. HEENT: Normal  Neck: Supple without bruits or JVD. Lungs:  Resp regular and unlabored, bilateral diffuse rhonchi and wheezing. Heart: Irregularly irregular no s3, s4, or murmurs. Abdomen: Soft, non-tender, non-distended, BS + x 4.  Extremities: No clubbing or cyanosis, 1+ bilateral LEE. Decreased DP/PT. Radials 2+ and equal bilaterally.  Labs  Troponin [facility] Test)  Recent Labs  08/18/14 1401  TROPIPOC 0.01   No results for input(s): CKTOTAL, CKMB, TROPONINI in the last 72 hours. Lab Results  Component Value Date   WBC 7.8 08/18/2014   HGB 10.5* 08/18/2014   HCT 36.5* 08/18/2014   MCV 70.7* 08/18/2014   PLT 226 08/18/2014    Recent Labs Lab 08/18/14 1350  NA 136  K 4.6  CL 99*  CO2 28  BUN 20  CREATININE 1.05  CALCIUM 8.7*  GLUCOSE 154*   Lab Results  Component Value Date   CHOL 80 08/05/2014   HDL 18* 08/05/2014   LDLCALC 52 08/05/2014   TRIG 51 08/05/2014   No results found for: DDIMER   Radiology/Studies  Dg Chest 2  View  08/18/2014   CLINICAL DATA:  Left side chest pain  EXAM: CHEST  2 VIEW  COMPARISON:  05/12/2013 and 08/01/2014  FINDINGS: Borderline cardiomegaly. There is small right pleural effusion with chronic blunting of the right costophrenic angle. No acute infiltrate or pulmonary edema. Status post median sternotomy. Stable single lead cardiac pacemaker position  IMPRESSION: Small right pleural effusion with chronic blunting of the right costophrenic angle. No acute infiltrate or pulmonary edema. Stable single lead cardiac pacemaker position.   Electronically Signed   By: Natasha Mead M.D.   On: 08/18/2014 14:40   Dg Chest Port 1 View  08/01/2014   CLINICAL DATA:  Left chest pain.  Productive cough.  EXAM: PORTABLE CHEST - 1 VIEW  COMPARISON:  05/12/2013.  FINDINGS: Stable enlarged cardiac silhouette and median sternotomy wires. Small right pleural effusion without significant change. Small amount of adjacent airspace opacity with progression. Diffusely prominent pulmonary vasculature and interstitial markings with improvement. Stable left subclavian pacemaker. The distal portion of the lead is not clearly visualized due to overlapping soft tissues. Unremarkable bones.  IMPRESSION: 1. Stable small right pleural effusion. 2. Mild increase in patchy opacity at the right lung base. This could represent atelectasis or pneumonia. 3. Stable cardiomegaly with improved changes of congestive heart failure.   Electronically Signed   By: Beckie Salts M.D.   On: 08/01/2014 15:23    ECG  Atrial fibrillation, HR in the 120s. Telemetry shows afib in the 90s-100s   ASSESSMENT AND PLAN  1. Chest Pain/CAD: h/o CABG. Currently CP free. Initial POC troponin is negative. EKG shows chronic afib. Chest pain may be secondary to acute on chronic CHF and/or hypertension. However, recommend cycling cardiac enzymes to r/o ACS. Continue on tele. Resume ASA, BB, statin and ACE. PRN SL NTG. Will determine need for ischemic eval based on  troponin results and response to therapy for acute CHF.   2. Acute on Chronic Systolic CHF: in the setting of medical noncompliance. BNP elevated. CXR shows right pleural effusion. He has peripheral edema on exam. Recent 2D echo showed no change in systolic function (still 20-25%). Treat with IV lasix. Strict I/Os, daily weights, low sodium diet, BB, ACE-I and spironolactone.   3. Ischemic Cardiomyopathy: EF 20-25%. Has ICD. Recent device interrogation 5/11 showed no recent  shock or ATP, <1 V pacing, lots of NSVT, however no persistent ventricular ectopy. Resume BB, ACE and aldosterone antagonist.   4. Chronic Atrial Fibrillation: rate is better controlled after dose of IV Cardizem. Continue PO BB. Restart Xarelto. Keep on telemetry.  5. Recent h/o CAP: was diagnosed 2 weeks ago but left AMA. Still with wheezing and rhonchi on exam. CXR negative for infiltrate. WBC normal. Afebrile.   6. Tobacco abuse: smoking cessation strongly advised.  7. DM: management per IM.  8. PVD: bilateral SFA occlusion. Not a percutaneous or surgical candidate. Continue medical therapy.   9. Medical Noncompliance: has difficulties with med and is homeless. Will need social work consult for medication and placement needs.     Signed, Robbie Lis, PA-C 08/18/2014, 3:20 PM  Cardiologist-Dr Allyson Sabal As above, patient seen and examined. Briefly he is a 59 year old male with a past medical history of noncompliance, coronary artery disease status post prior coronary artery bypass graft, ischemic cardiomyopathy, chronic systolic congestive heart failure, permanent atrial fibrillation, peripheral vascular disease, tobacco abuse, diabetes mellitus with atypical chest pain and acute on chronic systolic congestive heart failure. Patient states that over the past 24 hours he has had intermittent left-sided chest pain. It lasts 20 seconds and resolves. It occurs both at rest and with exertion. He also notes increasing dyspnea  on exertion and pedal edema. He has not taken his medicines in approximately 1 week. Plan to admit and rule out myocardial infarction with serial enzymes. If enzymes negative would not pursue further ischemia evaluation. He is volume overloaded on examination. Would treat with diuretics and follow renal function. Resume beta blocker, spironolactone and ACE inhibitor as tolerated by blood pressure. For atrial fibrillation would resume beta blocker for rate control. Continue xarelto. Needs social work consult and [facility] possibly to see to assist with medications and outpt FU. Olga Millers

## 2014-08-19 ENCOUNTER — Encounter (HOSPITAL_COMMUNITY): Payer: Self-pay | Admitting: *Deleted

## 2014-08-19 DIAGNOSIS — I2510 Atherosclerotic heart disease of native coronary artery without angina pectoris: Secondary | ICD-10-CM

## 2014-08-19 DIAGNOSIS — I482 Chronic atrial fibrillation: Secondary | ICD-10-CM

## 2014-08-19 DIAGNOSIS — Z9119 Patient's noncompliance with other medical treatment and regimen: Secondary | ICD-10-CM

## 2014-08-19 LAB — COMPREHENSIVE METABOLIC PANEL
ALK PHOS: 147 U/L — AB (ref 38–126)
ALT: 13 U/L — AB (ref 17–63)
AST: 18 U/L (ref 15–41)
Albumin: 3.1 g/dL — ABNORMAL LOW (ref 3.5–5.0)
Anion gap: 10 (ref 5–15)
BUN: 24 mg/dL — ABNORMAL HIGH (ref 6–20)
CHLORIDE: 95 mmol/L — AB (ref 101–111)
CO2: 29 mmol/L (ref 22–32)
Calcium: 8.3 mg/dL — ABNORMAL LOW (ref 8.9–10.3)
Creatinine, Ser: 1.29 mg/dL — ABNORMAL HIGH (ref 0.61–1.24)
GFR calc Af Amer: >60 mL/min (ref >60)
GFR calc non Af Amer: 60 mL/min — ABNORMAL LOW (ref >60)
Glucose, Bld: 275 mg/dL — ABNORMAL HIGH (ref 65–99)
Potassium: 4.6 mmol/L (ref 3.5–5.1)
Sodium: 134 mmol/L — ABNORMAL LOW (ref 135–145)
Total Bilirubin: 0.8 mg/dL (ref 0.3–1.2)
Total Protein: 7.3 g/dL (ref 6.5–8.1)

## 2014-08-19 LAB — TROPONIN I
Troponin I: 0.03 ng/mL
Troponin I: 0.03 ng/mL

## 2014-08-19 MED ORDER — FUROSEMIDE 10 MG/ML IJ SOLN
40.0000 mg | Freq: Every day | INTRAMUSCULAR | Status: DC
  Filled 2014-08-19: qty 4

## 2014-08-19 NOTE — Progress Notes (Signed)
Talked to patient about DCP; patient states that he is homeless and lives in a car. 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 No contact with his wife or adult son. He receives Disability- $600 monthly. CM asked patient about getting his medication - he has Medicaid with prescription drug coverage. Patient stated that he had no transportation- CM asked patient about the owner of the car, can he take you to the pharmacy to pick up your medication? Patient stated "perhaps" Lots of encouragement given about trying to do as much as possible for yourself. Patient also stated that has a contact person to call for help but just cannot call her . Again emotional support given. Soc Worker to follow up for possible assistance; Alexis Goodell [phone]

## 2014-08-19 NOTE — Clinical Social Work Note (Signed)
CSW met with patient and provided resources for him to start looking for affordable housing opitions.  Patient states he does not want to go to shelter due to the type of people who live there.  Patient states he will try to get some home assistance from the community.  This CSW to sign off, please reconsult of there are other psycho social needs.  Jones Broom. [place], MSW, [place] 08/19/2014 3:17 PM

## 2014-08-19 NOTE — Progress Notes (Signed)
Received report on Jacob [name] in 3E08.  Pt sitting up in chair.  Alert and oriented x 4 with no voiced complaints.  Instructed to call for any assistance needed.

## 2014-08-19 NOTE — Progress Notes (Signed)
Pharmacist Heart Failure Core Measure Documentation  Assessment: Jacob [name] has an EF documented as 20-25% on 08/03/14  Rationale: Heart failure patients with left ventricular systolic dysfunction (LVSD) and an EF < 40% should be prescribed an angiotensin converting enzyme inhibitor (ACEI) or angiotensin receptor blocker (ARB) at discharge unless a contraindication is documented in the medical record.  This patient is not currently on an ACEI or ARB for HF.  This note is being placed in the record in order to provide documentation that a contraindication to the use of these agents is present for this encounter.  ACE Inhibitor or Angiotensin Receptor Blocker is contraindicated (specify all that apply)  []   ACEI allergy AND ARB allergy []   Angioedema []   Moderate or severe aortic stenosis []   Hyperkalemia []   Hypotension []   Renal artery stenosis [x]   Worsening renal function, preexisting renal disease or dysfunction  Increased SCr due to aggressive diuresis   Jacob [name] 08/19/2014 3:03 PM

## 2014-08-19 NOTE — Progress Notes (Signed)
Patient Name: Jacob [name] Date of Encounter: 08/19/2014  Primary Cardiologist: Dr. Allyson Sabal Electrophysiologist: Dr. Ladona Ridgel   Principal Problem:   Acute on chronic combined systolic and diastolic heart failure Active Problems:   Essential hypertension   Noncompliance   CAD- s/p CABG July 2014 [facility]   AF (atrial fibrillation)   ICD - in place- BS May 2014 [place]   Tobacco abuse   PVD (peripheral vascular disease)   DM (diabetes mellitus), type 2 with peripheral vascular complications   Anemia of chronic disease    SUBJECTIVE  No more chest pain, breathing better.   CURRENT MEDS . carvedilol  25 mg Oral BID  . diltiazem (CARDIZEM) infusion  5-15 mg/hr Intravenous Once  . [START ON 08/20/2014] furosemide  40 mg Intravenous Daily  . glipiZIDE  5 mg Oral BID AC  . rivaroxaban  20 mg Oral QAC supper  . sodium chloride  3 mL Intravenous Q12H  . spironolactone  25 mg Oral Daily    OBJECTIVE  Filed Vitals:   08/18/14 2044 08/18/14 2048 08/19/14 0119 08/19/14 0614  BP: 134/77  110/52 118/84  Pulse: 105  101 91  Temp: 98 F (36.7 C)  97.8 F (36.6 C) 98 F (36.7 C)  TempSrc: Oral  Oral Oral  Resp: Height:  (1.626 m)     Weight: 229 lb 4.8 oz (104.01 kg)   227 lb 1.6 oz (103.012 kg)  SpO2: 96% 97% 96% 95%    Intake/Output Summary (Last 24 hours) at 08/19/14 0945 Last data filed at 08/19/14 0838  Gross per 24 hour  Intake    720 ml  Output   2475 ml  Net  -1755 ml   Filed Weights   08/18/14 1354 08/18/14 2044 08/19/14 0614  Weight: 229 lb 9.6 oz (104.146 kg) 229 lb 4.8 oz (104.01 kg) 227 lb 1.6 oz (103.012 kg)    PHYSICAL EXAM  General: Pleasant, NAD. Neuro: Alert and oriented X 3. Moves all extremities spontaneously. Psych: Normal affect. HEENT:  Normal  Neck: Supple without bruits + significant JVD. Lungs:  Resp regular and unlabored. Mildly decreased breath sound, however largely clear, no obvious rale. Heart: irregular. no s3,  s4, or murmurs. Abdomen: Soft, non-tender, non-distended, BS + x 4.  Extremities: No clubbing, cyanosis. DP/PT/Radials 2+ and equal bilaterally. 1+ LE edema  Accessory Clinical Findings  CBC  Recent Labs  08/18/14 1350  WBC 7.8  NEUTROABS 5.3  HGB 10.5*  HCT 36.5*  MCV 70.7*  PLT 226   Basic Metabolic Panel  Recent Labs  08/18/14 1350 08/19/14 0311  NA 136 134*  K 4.6 4.6  CL 99* 95*  CO2 28 29  GLUCOSE 154* 275*  BUN 20 24*  CREATININE 1.05 1.29*  CALCIUM 8.7* 8.3*   Liver Function Tests  Recent Labs  08/19/14 0311  AST 18  ALT 13*  ALKPHOS 147*  BILITOT 0.8  PROT 7.3  ALBUMIN 3.1*   Cardiac Enzymes  Recent Labs  08/18/14 2145 08/19/14 0311 08/19/14 0829  TROPONINI 0.03 0.03 0.03    TELE A-fib with HR 80-90s, however does go up to 110s with activity    ECG  A-fib with HR 100, LAFB  Echocardiogram 08/03/2014  LV EF: 20% -  25%  ------------------------------------------------------------------- Indications:   Atrial fibrillation - 427.31.  ------------------------------------------------------------------- History:  PMH:  Coronary artery disease. Congestive heart failure. PMH:  Myocardial infarction. Risk factors: Current tobacco use. Hypertension. Diabetes mellitus.  -------------------------------------------------------------------  Study Conclusions  - Left ventricle: The cavity size was normal. Left ventricular geometry showed evidence of eccentric hypertrophy. Systolic function was severely reduced. The estimated ejection fraction was in the range of 20% to 25%. Severe diffuse hypokinesis with no identifiable regional variations. Doppler parameters are consistent with elevated mean left atrial filling pressure. Acoustic contrast opacification revealed no evidence ofthrombus. - Left atrium: The atrium was severely dilated. - Right ventricle: The cavity size was mildly dilated. Systolic function was  severely reduced. - Right atrium: The atrium was severely dilated. - Pericardium, extracardiac: There was a left pleural effusion.    Radiology/Studies  Dg Chest 2 View  08/18/2014   CLINICAL DATA:  Left side chest pain  EXAM: CHEST  2 VIEW  COMPARISON:  05/12/2013 and 08/01/2014  FINDINGS: Borderline cardiomegaly. There is small right pleural effusion with chronic blunting of the right costophrenic angle. No acute infiltrate or pulmonary edema. Status post median sternotomy. Stable single lead cardiac pacemaker position  IMPRESSION: Small right pleural effusion with chronic blunting of the right costophrenic angle. No acute infiltrate or pulmonary edema. Stable single lead cardiac pacemaker position.   Electronically Signed   By: Natasha Mead M.D.   On: 08/18/2014 14:40   Dg Chest Port 1 View  08/01/2014   CLINICAL DATA:  Left chest pain.  Productive cough.  EXAM: PORTABLE CHEST - 1 VIEW  COMPARISON:  05/12/2013.  FINDINGS: Stable enlarged cardiac silhouette and median sternotomy wires. Small right pleural effusion without significant change. Small amount of adjacent airspace opacity with progression. Diffusely prominent pulmonary vasculature and interstitial markings with improvement. Stable left subclavian pacemaker. The distal portion of the lead is not clearly visualized due to overlapping soft tissues. Unremarkable bones.  IMPRESSION: 1. Stable small right pleural effusion. 2. Mild increase in patchy opacity at the right lung base. This could represent atelectasis or pneumonia. 3. Stable cardiomegaly with improved changes of congestive heart failure.   Electronically Signed   By: Beckie Salts M.D.   On: 08/01/2014 15:23    ASSESSMENT AND PLAN  59 y/o [place] male with known CAD s/p CABG, chronic systolic CHF, ischemic cardiomyopathy s/p ICD, PVD, HTN, HLD and DM, presenting with exertional chest pain and dyspnea.   1. Acute on chronic systolic HF  - Cr trending up after aggressive diuresis,  -1.7L I/O, weight down 2 lbs  - Lung clear on exam, but still has 1+ pitting edema in LE and significant JVD, ?how much of his symptom is due to R heart failure > L heart failure. Cut IV lasix down from 40mg  BID to 40mg  daily, gentle diuresis. (on 40mg  PO lasix daily at home) At some point, may benefit from RHC if volume status is uncertain   2. Chest pain in the setting of CHF  - serial enzyme negative, would not pursue further ischemic workup  3. Chronic a-fib on Xarelto  4. PVD with bilateral SFA occlussion  - underwent PV angio by Dr. Allyson Sabal, no favorable anatomy suitable for percutaneous interventin  - high risk surgical candidate, on medical therapy  5. ICM with EF 20-25% s/p ICD in [place] 2013  - Recent device interrogation 5/11 showed no recent shock or ATP, <1 V pacing, lots of NSVT, however no persistent ventricular ectopy.   6. HTN 7. HLD 8. DM 9. Tobacco abuse 10. Medical noncompliance/homeless  Difficult    Signed, Azalee Course PA-C Pager: [phone]  Patinet seen and examined  Still with evid of increased volume on exam.  Lasix pulled back to qd with bump in creatinine  WIll have to check in AM    [facility]

## 2014-08-19 NOTE — Progress Notes (Signed)
RN observed pt HR in the 90s when resting and above 120s when pt is having an activity.

## 2014-08-19 NOTE — Clinical Social Work Note (Signed)
Clinical Social Work Assessment  Patient Details  Name: Jacob [name] MRN: [id number] Date of Birth: 11/29/1955  Date of referral:  08/19/14               Reason for consult:  Housing Concerns/Homelessness                Permission sought to share information with:    Permission granted to share information::  Yes, Release of Information Signed  Name::        Agency::  [employer] [facility]  Relationship::     Contact Information:     Housing/Transportation Living arrangements for the past 2 months:  Homeless Source of Information:  Patient Patient Interpreter Needed:  None Criminal Activity/Legal Involvement Pertinent to Current Situation/Hospitalization:  No - Comment as needed Significant Relationships:  None Lives with:  Self Do you feel safe going back to the place where you live?  Yes (Patient is homeless, but he is able to find places to stay.) Need for family participation in patient care:  No (Coment)  Care giving concerns:  Patient does no have any support and is homeless.   Social Worker assessment / plan: Patient is a 59 year old male who is alert and oriented x4.  Patient states he is homeless and does not have a plan for where to go once he discharges from hospital.  Patient was offered a list of homeless shelters, and states he does not want to go because there are people who are drug addicts and alcoholics and he does not use any of those things.  Patient reports that he finds places to stay, and patient reported to nursing that he lives in a car.  Patient did not disclose this information to CSW even after CSW asked patient about it.  Patient stated he signed up for [employer], but did not know what the status was.  CSW contacted [facility]) who said the patient was not on the list and it was closed in August of 2015 and he did not know when it will be open again.  CSW was given information from [facility] who said patient can  contact [employer] to try to find affordable housing since he does received disability and [employer], or Social Serv for affordable housing.  CSW gave patient phone and website information for him to contact and see if he can get assistance with housing.  Patient expressed gratitude for information given, and will try to contact both organizations to see what he needs to do to get on the wait list or what he needs to do to start the process of applying.  CSW attempted to call HUD and social serv but there was no answer and not way to leave a message.  Patient stated he was in [employer] last year however it only was temporary.  CSW spoke with department director to see if patient would be a candidate for project again and she said no due to patient not following through with follow up recommendations.     Employment status:  Disabled (Comment on whether or not currently receiving Disability) Insurance information:  Medicaid [place] PT Recommendations:  Not assessed at this time Information / Referral to community resources:  Shelter, Other (Comment Required) (HUD and social [email or web address].net/ contact information for affordable housing.)  Patient/Family's Response to care:  Patient calm and relaxed no signs of agitation.  Patient states he will just have to look into [employer] and social serv to  try to find a place to live. Patient/Family's Understanding of and Emotional Response to Diagnosis, Current Treatment, and Prognosis:  :Patient seemed aware of diagnosis and prognosis. Appearance:  Appears stated age Attitude/Demeanor/Rapport:    Affect (typically observed):  Accepting, Pleasant Orientation:  Oriented to Self, Oriented to Place, Oriented to  Time, Oriented to Situation Alcohol / Substance use:  Not Applicable Psych involvement (Current and /or in the community):  No (Comment)  Discharge Needs  Concerns to be addressed:  Homelessness, Lack of Support Readmission within the last 30 days:  No Current discharge  risk:  Homeless Barriers to Discharge:     [place] Constable 08/19/2014, 2:56 PM

## 2014-08-19 NOTE — Hospital Discharge Follow-Up (Signed)
Transitional Care Clinic Care Coordination Note:  Admit date:  08/18/14 Discharge date: TBD Discharge Disposition: TBD-homeless refuses shelter Patient contact: [phone] Emergency contact(s): Mike Craze (friend) [phone]  This Case Manager reviewed patient's EMR and determined patient would benefit from post-discharge medical management and chronic care management services through the [facility]. Patient has a history of congestive heart failure, HTN, CAD, diabetes mellitus, Atrial fibrillation on Xarelto. This Case Manager met with patient to discuss the services and medical management that can be provided at the [facility]. Patient verbalized understanding and agreed to receive post-discharge care at the [facility].   Patient scheduled for Transitional Care appointment on 08/26/14 at 1445.  Clinic information and appointment time provided to patient. Appointment information also placed on AVS.  Assessment:       Home Environment: Patient homeless. Refuses to go to shelter after discharge.  Provided patient with [facility] information and thoroughly discussed services available there and their Intake process. Will update [employer], SW of patient's barriers.       Support System: friend, Mike Craze. Patient indicates he has a support system in the community, but he does feel comfortable asking to live with them; however, he does utilize his support system for showers.       Level of functioning: independent       Home DME: none       Home care services: none       Transportation: Patient uses bus system when he is able to afford it, but has difficulty getting to medical appointments. Patient has Medicaid. Provided patient with [facility] and provided number for phone assessment to be done.  Assessment will need to be done prior to patient utilizing [facility] to medical appointments. Stressed importance  of making call as soon as possible. Transitional Care Clinic able to provide transportation to appointment if assessment process still pending.  Will need to know patient's location for cab or Medicaid transport.        Food/Nutrition: Food stamps-$36/month.  Patient walks to grocery stores and uses some of Disability income ($600/month) on food.        Medications: Patient uses [facility] pharmacy for meds, but indicates he is out of some of his medications.  Patient also uses [facility]. Discussed importance of obtaining discharge medications and medication compliance.         Identified Barriers: Homelessness, transportation        PCP: Dr. Doreene Burke             Arranged services:        Services communicated to Olga Coaster, RN Case Manager

## 2014-08-19 NOTE — Progress Notes (Signed)
Referral made to [facility] at the [facility]. Apt made for August 26, 2014 at 2:45 pm- they will provide transportation for the patient and also the SW at the facility will refer him to [facility] for additional resources for the patient; Alexis Goodell [phone]

## 2014-08-20 MED ORDER — RIVAROXABAN 20 MG PO TABS: 20.0000 mg | ORAL_TABLET | Freq: Every day | ORAL | Status: DC

## 2014-08-20 MED ORDER — ACETAMINOPHEN 325 MG PO TABS: 650.0000 mg | ORAL_TABLET | ORAL | Status: DC | PRN

## 2014-08-20 NOTE — Progress Notes (Signed)
SUBJECTIVE: Patient is improving. He feels much better. He wants to go home today.   Filed Vitals:   08/19/14 0614 08/19/14 1455 08/19/14 2032 08/20/14 0503  BP: 118/84 102/64 119/58 100/69  Pulse: 91 82 101 107  Temp: 98 F (36.7 C) 98.1 F (36.7 C) 97.9 F (36.6 C) 98 F (36.7 C)  TempSrc: Oral Oral Oral Oral  Resp: 18 20 20 20   Height:      Weight: 227 lb 1.6 oz (103.012 kg)   226 lb 14.4 oz (102.921 kg)  SpO2: 95% 100% 98% 96%     Intake/Output Summary (Last 24 hours) at 08/20/14 1148 Last data filed at 08/20/14 0919  Gross per 24 hour  Intake   1230 ml  Output   1500 ml  Net   -270 ml    LABS: Basic Metabolic Panel:  Recent Labs  70/35/00 1350 08/19/14 0311  NA 136 134*  K 4.6 4.6  CL 99* 95*  CO2 28 29  GLUCOSE 154* 275*  BUN 20 24*  CREATININE 1.05 1.29*  CALCIUM 8.7* 8.3*   Liver Function Tests:  Recent Labs  08/19/14 0311  AST 18  ALT 13*  ALKPHOS 147*  BILITOT 0.8  PROT 7.3  ALBUMIN 3.1*   No results for input(s): LIPASE, AMYLASE in the last 72 hours. CBC:  Recent Labs  08/18/14 1350  WBC 7.8  NEUTROABS 5.3  HGB 10.5*  HCT 36.5*  MCV 70.7*  PLT 226   Cardiac Enzymes:  Recent Labs  08/18/14 2145 08/19/14 0311 08/19/14 0829  TROPONINI 0.03 0.03 0.03   BNP: Invalid input(s): POCBNP D-Dimer: No results for input(s): DDIMER in the last 72 hours. Hemoglobin A1C: No results for input(s): HGBA1C in the last 72 hours. Fasting Lipid Panel: No results for input(s): CHOL, HDL, LDLCALC, TRIG, CHOLHDL, LDLDIRECT in the last 72 hours. Thyroid Function Tests: No results for input(s): TSH, T4TOTAL, T3FREE, THYROIDAB in the last 72 hours.  Invalid input(s): FREET3  RADIOLOGY: Dg Chest 2 View  08/18/2014   CLINICAL DATA:  Left side chest pain  EXAM: CHEST  2 VIEW  COMPARISON:  05/12/2013 and 08/01/2014  FINDINGS: Borderline cardiomegaly. There is small right pleural effusion with chronic blunting of the right costophrenic angle.  No acute infiltrate or pulmonary edema. Status post median sternotomy. Stable single lead cardiac pacemaker position  IMPRESSION: Small right pleural effusion with chronic blunting of the right costophrenic angle. No acute infiltrate or pulmonary edema. Stable single lead cardiac pacemaker position.   Electronically Signed   By: Natasha Mead M.D.   On: 08/18/2014 14:40   Dg Chest Port 1 View  08/01/2014   CLINICAL DATA:  Left chest pain.  Productive cough.  EXAM: PORTABLE CHEST - 1 VIEW  COMPARISON:  05/12/2013.  FINDINGS: Stable enlarged cardiac silhouette and median sternotomy wires. Small right pleural effusion without significant change. Small amount of adjacent airspace opacity with progression. Diffusely prominent pulmonary vasculature and interstitial markings with improvement. Stable left subclavian pacemaker. The distal portion of the lead is not clearly visualized due to overlapping soft tissues. Unremarkable bones.  IMPRESSION: 1. Stable small right pleural effusion. 2. Mild increase in patchy opacity at the right lung base. This could represent atelectasis or pneumonia. 3. Stable cardiomegaly with improved changes of congestive heart failure.   Electronically Signed   By: Beckie Salts M.D.   On: 08/01/2014 15:23    PHYSICAL EXAM patient is oriented to person time and place. Affect is normal.  He is pushing hard to go home. There is no jugulovenous distention. Lungs are clear. Respiratory effort is not labored. Cardiac exam reveals an S1 and S2. Abdomen is soft. He has 1+ peripheral edema.   TELEMETRY: I have reviewed telemetry today, Aug 20, 2014. He has atrial fibrillation.   ASSESSMENT AND PLAN:  Principal Problem:   Acute on chronic combined systolic and diastolic heart failure     The patient's volume status is stabilized. He still has mild edema. However he is very adamant about going home. I feel this is safe for him. There's been mild variation in his renal function. I feel he will be  safe on his home dose of diarrhetics.    Essential hypertension   Noncompliance   CAD- s/p CABG July 2014 [facility]   AF (atrial fibrillation)   ICD - in place- BS May 2014 [place]   Tobacco abuse   PVD (peripheral vascular disease)   DM (diabetes mellitus), type 2 with peripheral vascular complications   Anemia of chronic disease   Willa Rough 08/20/2014 11:48 AM

## 2014-08-20 NOTE — Discharge Instructions (Signed)
Weigh daily Call [phone] if weight climbs more than 3 pounds in a day or 5 pounds in a week. No salt to very little salt in your diet.  No more than 2000 mg in a day. Call if increased shortness of breath or increased swelling.   If your symptoms increase:  Shortness of breath or racing heart rate, please call the office.  We are holding you lisinopril now, we may resume in the office.  We will see you back in next 2 weeks the office will call with the date and time.  Stop smoking

## 2014-08-20 NOTE — Discharge Summary (Signed)
Physician Discharge Summary       Patient ID: Jacob [name] MRN: [id number] DOB/AGE: [birth year] 59 y.o.  Admit date: 08/18/2014 Discharge date: 08/20/2014 Primary Cardiologist:Dr. Gwenlyn Found EP: Dr. Lovena Le   Discharge Diagnoses:  Principal Problem:   Acute on chronic combined systolic and diastolic heart failure Active Problems:   Essential hypertension   Noncompliance   CAD- s/p CABG July 2014 [facility]   AF (atrial fibrillation)   ICD - in place- BS May 2014 [place]   Tobacco abuse   PVD (peripheral vascular disease)   DM (diabetes mellitus), type 2 with peripheral vascular complications   Anemia of chronic disease   Discharged Condition: good  Procedures: none  Hospital Course:   59 y/o male, followed by Dr. Gwenlyn Found. He is from [place]. Has been living in the [place] for over 20 years. He is homeless with no close relatives. He has a history of CAD status post CABG x2 at [facility] April 2013. He has severe ischemic cardiomyopathy (EF 20-25% 04/06/13) and had an ICD implantation in [place] in 2013. This is now followed by Dr. Lovena Le. Other problems include a long history of tobacco abuse, hypertension, hyperlipidemia and diabetes. He does have chronic A. Fib on oral anticoagulation with Xarelto. He has severe PVD with bilateral SFA occlusions. 12/02/13 he underwent PV angio by Dr. Gwenlyn Found but did not have favorable anatomy suitable for percutaneous intervention. He is felt to be high risk for surgical revascularization, thus medical therapy was elected.   He was recently admitted 08/03/14 for acute respiratory failure in the setting of acute on chronic systolic CHF and CAP, however left AMA on 08/05/14. During that time, device interrogation revealed no recent shocks or ATP, <1 V pacing, lots of NSVT, however no persistent ventricular ectopy. 2D echo 5/11 showed persistent LV dysfunction with EF at 20-25%. No ischemic w/u conducted.   He presented back to the [facility] ER 08/18/14 with  complaints of a 2 day history of intermittent chest pain and 1 month h/o progressive DOE, orthopnea and PND. Chest pain feels similar to prior angina before CABG. Described as left sided tightness. Exacerbated by exertion. Relieved with rest. He admits that he has not been fully compliant with his meds. He ran out of Xarelto, lisinopril and Lipitor 1 week ago. He reports compliance with coreg, lasix, spironolactone and glipizide. Denies high sodium foods. He was pain free in ER. EKG showed atrial fibrillation w/ RVR. HR in the 120s.  Though improved on telemetry to slower rate in the 90s-low 100s. Diastolic BP is elevated in the 110s. BNP elevated at 670 (960 2 weeks ago). CXR shows small right pleural effusion. In ER, he has received SL NTG and 10 mg of IV Cardizem x 1. Labs with negative for MI.  Unable to resume ACE due to borderline BP will follow as outpt.   By the 28th of May, pt was wanting to be discharged.  Exam was stable with mild edema and a fib controlled.  Dr. Ron Parker examined the pt and felt he was safe for discharge as pt was adamant about going home. He will follow up next week.  His meds were re ordered, the case manager met with him and he felt he could obtain meds.   Also issue of place to live.  Pt did not wish to go to shelter.  He was given resources for him to look for affordable housing.    Consults: None  Significant Diagnostic Studies:  BMP Latest Ref  Rng 08/19/2014 08/18/2014 08/05/2014  Glucose 65 - 99 mg/dL 275(H) 154(H) 224(H)  BUN 6 - 20 mg/dL 24(H) 20 27(H)  Creatinine 0.61 - 1.24 mg/dL 1.29(H) 1.05 1.33(H)  Sodium 135 - 145 mmol/L 134(L) 136 132(L)  Potassium 3.5 - 5.1 mmol/L 4.6 4.6 4.3  Chloride 101 - 111 mmol/L 95(L) 99(L) 93(L)  CO2 22 - 32 mmol/L _0 Calcium 8.9 - 10.3 mg/dL 8.3(L) 8.7(L) 8.2(L)   CBC Latest Ref Rng 08/18/2014 08/02/2014 08/01/2014  WBC 4.0 - 10.5 K/uL 7.8 8.3 -  Hemoglobin 13.0 - 17.0 g/dL 10.5(L) 10.6(L) 11.9(L)  Hematocrit 39.0 - 52.0 %  36.5(L) 37.3(L) 35.0(L)  Platelets 150 - 400 K/uL 226 233 -    Troponin 0.03 X 3  BNP (last 3 results)  Recent Labs  08/01/14 1453 08/18/14 1300  BNP 960.6* 670.1*    CHEST 2 VIEW COMPARISON: 05/12/2013 and 08/01/2014 FINDINGS: Borderline cardiomegaly. There is small right pleural effusion with chronic blunting of the right costophrenic angle. No acute infiltrate or pulmonary edema. Status post median sternotomy. Stable single lead cardiac pacemaker position IMPRESSION: Small right pleural effusion with chronic blunting of the right costophrenic angle. No acute infiltrate or pulmonary edema. Stable single lead cardiac pacemaker position.  Discharge Exam: Blood pressure 100/69, pulse 107, temperature 98 F (36.7 C), temperature source Oral, resp. rate 20, height _1  (1.626 m), weight 226 lb 14.4 oz (102.921 kg), SpO2 96 %.  Disposition: Home      Medication List    STOP taking these medications        levofloxacin 500 MG tablet  Commonly known as:  LEVAQUIN     lisinopril 2.5 MG tablet  Commonly known as:  ZESTRIL      TAKE these medications        acetaminophen 325 MG tablet  Commonly known as:  TYLENOL  Take 2 tablets (650 mg total) by mouth every 4 (four) hours as needed for headache or mild pain.     carvedilol 25 MG tablet  Commonly known as:  COREG  Take 1 tablet (25 mg total) by mouth 2 (two) times daily.     furosemide 40 MG tablet  Commonly known as:  LASIX  Take 1 tablet (40 mg total) by mouth daily.     glipiZIDE 5 MG tablet  Commonly known as:  GLUCOTROL  Take 1 tablet (5 mg total) by mouth 2 (two) times daily before a meal.     rivaroxaban 20 MG Tabs tablet  Commonly known as:  XARELTO  Take 1 tablet (20 mg total) by mouth daily before supper.     spironolactone 25 MG tablet  Commonly known as:  ALDACTONE  Take 1 tablet (25 mg total) by mouth daily.       Follow-up Information    Follow up with [facility]     On 08/26/2014.   Why:  Transitional Care Appointment on 08/26/14 at 2:45 with Dr. Lisette Grinder information:   [address] [place] [address] [phone]      Follow up with Quay Burow, MD.   Specialties:  Cardiology, Radiology   Why:  the office will call with date and time   Contact information:   [address] [place] [address] [phone]       Follow up with [facility] Office On 08/24/2014.   Specialty:  Cardiology   Why:  11:30 AM  for device check.    Contact information:   [address], [place] [phone]       Discharge Instructions: Weigh daily Call [phone] if weight climbs more than 3 pounds in a day or 5 pounds in a week. No salt to very little salt in your diet.  No more than 2000 mg in a day. Call if increased shortness of breath or increased swelling.   If your symptoms increase:  Shortness of breath or racing heart rate, please call the office.  We are holding you lisinopril now, we may resume in the office.  We will see you back in next 2 weeks the office will call with the date and time.  Stop smoking  Signed: [facility]: [facility] 08/20/2014, 2:23 PM  Time spent on discharge : >30 minutes.   Patient seen and examined. I agree with the assessment and plan as detailed above. See also my additional thoughts below.   Refer also to my progress note from today. I made the decision for discharge. I agree with the discharge noted above and the plans as outlined.  Dola Argyle, MD, [facility] 08/22/2014 8:13 AM

## 2014-08-23 ENCOUNTER — Telehealth: Payer: Self-pay

## 2014-08-23 ENCOUNTER — Telehealth: Payer: Self-pay | Admitting: Cardiovascular Disease

## 2014-08-23 NOTE — Telephone Encounter (Signed)
Transitional Care Clinic Post-discharge Follow-Up Phone Call:  Date of Discharge: 08/20/2014 Principal Discharge Diagnosis(es): acute on chronic heart failure , CAD, HTN Post-discharge Communication:  CM called the patient at 11:30am  Call Completed: Y                     With Whom: Patient Interpreter Needed  N               Language/Dialect:     Please check all that apply:  X Patient is knowledgeable of his/her condition(s) and/or treatment. X Patient is caring for self  - he is homeless and currently in [place] and stated that he took a bus to [place].  ? Patient is receiving assist at home from family and/or caregiver. Family and/or caregiver is knowledgeable of patient's condition(s) and/or treatment. ? Patient is receiving home health services. If so, name of agency.     Medication Reconciliation:  ? Medication list reviewed with patient. X  He has not obtained his discharge medications because he said that he does not have any money for co-pays.  He noted that he has the prescriptions but does not [facility] or [facility] because he doesn't have any money. He also noted that he does not have any meds left from prior to his hospitalization.  Discussed the importance of having the prescriptions filled and taking he medications as ordered. Instructed him that the [facility] can fill the prescriptions and address his concerns about the copays. He said that he will take a bus to the [facility] this afternoon and should be there by 4:00pm. Lorenza Burton, [facility] notified of the patient's concerns and his plan to be at the pharmacy with his prescriptions this afternoon.    Activities of Daily Living:  X Independent ? Needs assist (describe; ? home DME used) ? Total Care (describe, ? home DME used)   Community resources in place for patient:   X None  - He has been given information about the [employer] as well as the contact information to apply for  medicaid transportation.  ? Home Health/Home DME ? Assisted Living ? Support Group          Patient Education: Resources provided by the [facility] pharmacy, the importance of medication compliance.          Questions/Concerns discussed:  He confirmed that he is aware of his appointment on 08/26/14 @ 2:45pm but noted that he might need assistance with transportation.  He is not sure where he will be that day so CM will need to confirm his transportation status the day prior to his appointment.

## 2014-08-23 NOTE — Telephone Encounter (Signed)
Needs a Discharge phone call . Thanks  °

## 2014-08-23 NOTE — Telephone Encounter (Signed)
No answer, no VM pickup when dialed. 

## 2014-08-24 ENCOUNTER — Encounter: Payer: Medicaid Other | Admitting: Internal Medicine

## 2014-08-24 ENCOUNTER — Telehealth: Payer: Self-pay

## 2014-08-24 NOTE — Telephone Encounter (Signed)
Called patient to inquire if he picked up his medications yesterday at the [facility] pharmacy.   He said that he was not able to get them yesterday but will be at the clinic today after 3:00pm to get them .  He said that he is still in [place] but will have someone drive him to the clinic this afternoon. He is aware of his appointment on 08/26/14.

## 2014-08-25 ENCOUNTER — Other Ambulatory Visit: Payer: Self-pay | Admitting: Internal Medicine

## 2014-08-25 ENCOUNTER — Telehealth: Payer: Self-pay

## 2014-08-25 NOTE — Telephone Encounter (Signed)
Call placed once again to patient to discuss transportation.  Unable to reach. Spoke with Robyne Peers, RN CM who indicates patient aware of his appointment tomorrow with [facility].

## 2014-08-25 NOTE — Telephone Encounter (Signed)
No answer when dialed x2.

## 2014-08-25 NOTE — Telephone Encounter (Signed)
Call placed to patient to discuss Transitional [facility] Clinic appointment on 08/26/14 at 1445 with Dr. Venetia Night.  Patient indicates he is aware of appointment.  Attempted to discuss transportation to appointment, but call got disconnected.  Attempted to call patient back x 2 unable to reach patient or leave voicemail.  Also, placed call to emergency contact (per patient), Karolee Ohs [phone]) and left voicemail requesting return call.

## 2014-08-25 NOTE — Telephone Encounter (Signed)
Calls placed once again to patient to determine if transportation needed to [facility] Clinic appointment on 08/26/14.  Call placed to patient's cell x 2. Phone rang but no voicemail available to leave message.

## 2014-08-26 ENCOUNTER — Other Ambulatory Visit: Payer: Self-pay

## 2014-08-26 ENCOUNTER — Inpatient Hospital Stay: Payer: Medicaid Other | Admitting: Family Medicine

## 2014-08-26 ENCOUNTER — Telehealth: Payer: Self-pay

## 2014-08-26 MED ORDER — FUROSEMIDE 40 MG PO TABS: 40.0000 mg | ORAL_TABLET | Freq: Every day | ORAL | Status: DC

## 2014-08-26 NOTE — Telephone Encounter (Signed)
Call placed to patient to determine reason patient did not come to [facility] appointment today. Patient indicated he did not have ride to appointment.  Informed patient that this RN attempted numerous times to speak with him to arrange transportation yesterday but unable to reach him via phone. He indicates he was at pharmacy picking up medications.  He indicates he picked up glipizide, carvedilol, and aldactone from pharmacy but was unable to pick up lasix as he did not have any refills left on script. Patient already picked up Xarelto. Asked patient to hold while appointment rescheduled.  Call disconnected.  Elpidio Eric, RN spoke with pharmacy who indicates MD authorization needed for lasix refill.  Dr. Venetia Night gave verbal order for lasix and Elpidio Eric, RN to order lasix refill.  Patient appointment rescheduled for 08/31/14 at 0945.  Call placed to patient to inform of new appointment and to inform that lasix reordered. Unable to reach patient x2. Left voicemail requesting return call.

## 2014-08-29 ENCOUNTER — Encounter: Payer: Self-pay | Admitting: Family Medicine

## 2014-08-29 ENCOUNTER — Ambulatory Visit: Payer: Medicaid Other | Attending: Family Medicine | Admitting: Family Medicine

## 2014-08-29 ENCOUNTER — Telehealth: Payer: Self-pay

## 2014-08-29 VITALS — BP 117/79 | HR 132 | Temp 97.4°F | Resp 18 | Ht 64.0 in | Wt 228.2 lb

## 2014-08-29 DIAGNOSIS — Z590 Homelessness: Secondary | ICD-10-CM | POA: Insufficient documentation

## 2014-08-29 DIAGNOSIS — E1151 Type 2 diabetes mellitus with diabetic peripheral angiopathy without gangrene: Secondary | ICD-10-CM

## 2014-08-29 DIAGNOSIS — I252 Old myocardial infarction: Secondary | ICD-10-CM | POA: Insufficient documentation

## 2014-08-29 DIAGNOSIS — Z9861 Coronary angioplasty status: Secondary | ICD-10-CM | POA: Insufficient documentation

## 2014-08-29 DIAGNOSIS — Z951 Presence of aortocoronary bypass graft: Secondary | ICD-10-CM | POA: Diagnosis not present

## 2014-08-29 DIAGNOSIS — I5022 Chronic systolic (congestive) heart failure: Secondary | ICD-10-CM | POA: Diagnosis not present

## 2014-08-29 DIAGNOSIS — I255 Ischemic cardiomyopathy: Secondary | ICD-10-CM | POA: Insufficient documentation

## 2014-08-29 DIAGNOSIS — Z95810 Presence of automatic (implantable) cardiac defibrillator: Secondary | ICD-10-CM | POA: Insufficient documentation

## 2014-08-29 DIAGNOSIS — I10 Essential (primary) hypertension: Secondary | ICD-10-CM | POA: Diagnosis not present

## 2014-08-29 DIAGNOSIS — I2510 Atherosclerotic heart disease of native coronary artery without angina pectoris: Secondary | ICD-10-CM | POA: Diagnosis not present

## 2014-08-29 DIAGNOSIS — Z720 Tobacco use: Secondary | ICD-10-CM

## 2014-08-29 DIAGNOSIS — I482 Chronic atrial fibrillation, unspecified: Secondary | ICD-10-CM

## 2014-08-29 DIAGNOSIS — F17210 Nicotine dependence, cigarettes, uncomplicated: Secondary | ICD-10-CM | POA: Diagnosis not present

## 2014-08-29 DIAGNOSIS — I739 Peripheral vascular disease, unspecified: Secondary | ICD-10-CM | POA: Diagnosis not present

## 2014-08-29 DIAGNOSIS — E1159 Type 2 diabetes mellitus with other circulatory complications: Secondary | ICD-10-CM

## 2014-08-29 DIAGNOSIS — I5023 Acute on chronic systolic (congestive) heart failure: Secondary | ICD-10-CM

## 2014-08-29 DIAGNOSIS — Z7901 Long term (current) use of anticoagulants: Secondary | ICD-10-CM | POA: Diagnosis not present

## 2014-08-29 DIAGNOSIS — E1165 Type 2 diabetes mellitus with hyperglycemia: Secondary | ICD-10-CM | POA: Diagnosis not present

## 2014-08-29 DIAGNOSIS — I4891 Unspecified atrial fibrillation: Secondary | ICD-10-CM | POA: Insufficient documentation

## 2014-08-29 DIAGNOSIS — I5043 Acute on chronic combined systolic (congestive) and diastolic (congestive) heart failure: Secondary | ICD-10-CM

## 2014-08-29 DIAGNOSIS — F17200 Nicotine dependence, unspecified, uncomplicated: Secondary | ICD-10-CM | POA: Insufficient documentation

## 2014-08-29 DIAGNOSIS — I471 Supraventricular tachycardia: Secondary | ICD-10-CM | POA: Diagnosis not present

## 2014-08-29 LAB — GLUCOSE, POCT (MANUAL RESULT ENTRY): POC Glucose: 269 mg/dl — AB (ref 70–99)

## 2014-08-29 MED ORDER — CARVEDILOL 3.125 MG PO TABS: 3.1250 mg | ORAL_TABLET | Freq: Two times a day (BID) | ORAL | Status: DC

## 2014-08-29 MED ORDER — FUROSEMIDE 40 MG PO TABS: 40.0000 mg | ORAL_TABLET | Freq: Every day | ORAL | Status: DC

## 2014-08-29 MED ORDER — NICOTINE 7 MG/24HR TD PT24: 7.0000 mg | MEDICATED_PATCH | Freq: Every day | TRANSDERMAL | Status: DC

## 2014-08-29 MED ORDER — FUROSEMIDE 10 MG/ML IJ SOLN
40.0000 mg | Freq: Once | INTRAMUSCULAR | Status: AC
  Administered 2014-08-29: 40 mg via INTRAVENOUS

## 2014-08-29 MED ORDER — CARVEDILOL 25 MG PO TABS: 25.0000 mg | ORAL_TABLET | Freq: Two times a day (BID) | ORAL | Status: DC

## 2014-08-29 NOTE — Telephone Encounter (Signed)
Called patient to notify him that his lasix is ready at the pharmacy and he said that he would have a friend bring him to the clinic today.  This CM informed Dr Venetia Night that the patient will be here today to pickup his lasix and an appointment with Dr Venetia Night was scheduled for him at 11:00am today. The patient said that his friend could bring him to the appointment and he could be at the clinic at 10:45AM.  Darrel Reach, [facility] was notified of the visit today.  Today's appointment is  replacing his appointment for 08/31/14.

## 2014-08-29 NOTE — Progress Notes (Signed)
Transitional care clinic.  Date of Telephone call:08/23/14  Date of 1st service:08/29/14   Admit Date:08/18/14 Discharge Date: 08/20/14  PCP: Dr Hyman Hopes  HPI Admit date: 08/18/14 Discharge date: 08/20/14.  Jacob [name] is a 59 year old homeless male with a history of CAD status post CABG 2, severe ischemic cardiomyopathy with EF of 20-25% status post ICD, hypertension, hyperlipidemia, type 2 diabetes mellitus, atrial fibrillation on anticoagulation with Xarelto, PVD who had presented to [facility] ER with chest pains and progressive shortness of breath, orthopnea and paroxysmal nocturnal dyspnea.  Prior to this presentation he was admitted between 08/03/14 and 08/05/14 for acute respiratory failure in the setting of acute on chronic systolic CHF and community-acquired pneumonia but left AMA. He had admitted to running out of some of his medications; He was pain free in ER. EKG showed atrial fibrillation w/ RVR. HR in the 120s. Though improved on telemetry to slower rate in the 90s-low 100s. Diastolic BP was elevated in the 110s. BNP elevated at 670 (960 2 weeks ago). CXR showed small right pleural effusion. In ER, he received SL NTG and 10 mg of IV Cardizem x 1. Troponins were 0.033. The patient was requesting to be discharged and given he had stable exam with exception of mild pedal edema and controlled A. fib he was discharged with recommendations for outpatient follow-up.   Interval History: States he has pedal edema, dyspnea and of note he has been out of his Lasix since discharge and did not pickup his Lasix along with his other medicines. He is requesting a prescription for Advair which he said he was given about 6 months ago by another physician but his medical history is does not reflect asthma or COPD. He has an upcoming appointment with cardiology on 09/01/14.    Health Care needs: Transportation to and from Dr. Algie Coffer; he is also homeless and a friend brought him in today.  Past  Medical History  Diagnosis Date  . Hypertension   . Systolic heart failure     EF is 40-45% by echo, December 2013  . Noncompliance   . CAD (coronary artery disease) Sept 2013    s/p cardiac cath showing occlusion of small RCA with collaterals  . Chronic anticoagulation     on coumadin  . Atrial fibrillation   . Peripheral arterial disease   . Automatic implantable cardioverter-defibrillator in situ   . High cholesterol   . Myocardial infarction 2014  . Type II diabetes mellitus     Past Surgical History  Procedure Laterality Date  . Implantable cardioverter defibrillator implant      [place] in 07/2012; [employer]  . Coronary artery bypass graft  09/2012    2 vessels per patient [name]   . Coronary angioplasty with stent placement  11/2011    "1"  . Cardiac catheterization  09/2012  . Iliac artery stent Right 08/30/2013  . Left and right heart catheterization with coronary angiogram N/A 12/02/2011    Procedure: LEFT AND RIGHT HEART CATHETERIZATION WITH CORONARY ANGIOGRAM;  Surgeon: Kathleene Hazel, MD;  Location: [facility] CATH LAB;  Service: Cardiovascular;  Laterality: N/A;  . Lower extremity angiogram N/A 08/30/2013    Procedure: LOWER EXTREMITY ANGIOGRAM;  Surgeon: Runell Gess, MD;  Location: [facility] CATH LAB;  Service: Cardiovascular;  Laterality: N/A;  . Lower extremity angiogram N/A 12/02/2013    Procedure: LOWER EXTREMITY ANGIOGRAM;  Surgeon: Runell Gess, MD;  Location: [facility] CATH LAB;  Service: Cardiovascular;  Laterality: N/A;  History   Social History  . Marital Status: Divorced    Spouse Name: N/A  . Number of Children: 3  . Years of Education: N/A   Occupational History  . Unemployed    Social History Main Topics  . Smoking status: Current Every Day Smoker -- 1.00 packs/day for 38 years    Types: Cigarettes  . Smokeless tobacco: Never Used  . Alcohol Use: No  . Drug Use: No  . Sexual Activity: Yes   Other Topics Concern  . Not on file   Social  History Narrative   Has an apartment with a roommate. He was living on the streets in 01-16-2013.  He reports that his father died in [place] in 01/16/2013.  He is divorced.  He is no longer estranged from his son, but still from his daughter who lives locally.  Neither of his parents, nor any siblings have any history of CAD.    No Known Allergies  Current Outpatient Prescriptions on File Prior to Visit  Medication Sig Dispense Refill  . glipiZIDE (GLUCOTROL) 5 MG tablet Take 1 tablet (5 mg total) by mouth 2 (two) times daily before a meal. 60 tablet 0  . rivaroxaban (XARELTO) 20 MG TABS tablet Take 1 tablet (20 mg total) by mouth daily before supper. 30 tablet 6  . spironolactone (ALDACTONE) 25 MG tablet Take 1 tablet (25 mg total) by mouth daily. 30 tablet 6  . acetaminophen (TYLENOL) 325 MG tablet Take 2 tablets (650 mg total) by mouth every 4 (four) hours as needed for headache or mild pain. (Patient not taking: Reported on 08/29/2014)     No current facility-administered medications on file prior to visit.      REVIEW OF SYSTEMS General: negative for fever, weight loss, appetite change Eyes: no visual symptoms. ENT: no ear symptoms, no sinus tenderness, no nasal congestion or sore throat. Neck: no pain  Respiratory: no wheezing, has shortness of breath, has cough Cardiovascular: no chest pain, no dyspnea on exertion, has pedal edema, has orthopnea. Gastrointestinal: no abdominal pain, no diarrhea, no constipation Genito-Urinary: no urinary frequency, no dysuria, no polyuria. Hematologic: no bruising Endocrine: no cold or heat intolerance Neurological: no headaches, no seizures, no tremors Musculoskeletal: no joint pains, no joint swelling Skin: no pruritus, no rash. Psychological: no depression, no anxiety,     PHYSICAL EXAM Filed Vitals:   08/29/14 1049  BP: 131/74  Pulse: 134  Temp: 97.7 F (36.5 C)  TempSrc: Oral  Resp: 18  Height: 5\' 4"  (1.626 m)  Weight: 228  lb 3.2 oz (103.511 kg)  SpO2: 95%   Constitutional: normal appearing,  Eyes: PERRLA HEENT: Head is atraumatic, normal sinuses, normal oropharynx, normal appearing tonsils and palate, tympanic membrane is normal bilaterally. Neck: normal range of motion, no thyromegaly, + JVD Cardiovascular: tachycardic rate and normal rhythm, normal heart sounds, no murmurs, rub or gallop, 1+ non pitting bilateral pedal edema Respiratory: rales bilaterally Abdomen: soft, not tender to palpation, normal bowel sounds, no enlarged organs Extremities: Full ROM, no tenderness in joints Skin: warm and dry, no lesions. Neurological: alert, oriented x3, cranial nerves I-XII grossly intact , normal motor strength, normal sensation. Psychological: normal mood.  EXAM: CHEST 2 VIEW  COMPARISON: 05/12/2013 and 08/01/2014  FINDINGS: Borderline cardiomegaly. There is small right pleural effusion with chronic blunting of the right costophrenic angle. No acute infiltrate or pulmonary edema. Status post median sternotomy. Stable single lead cardiac pacemaker position  IMPRESSION: Small right pleural effusion with  chronic blunting of the right costophrenic angle. No acute infiltrate or pulmonary edema. Stable single lead cardiac pacemaker position.   Electronically Signed  By: Natasha Mead M.D.  On: 08/18/2014 14:40   Transthoracic Echocardiography  Patient:  Jacob [name], Jacob [name] MR #:    [id number] Study Date: 08/03/2014 Gender:   M Age:    62 Height:   165.1 cm Weight:   109.8 kg BSA:    2.3 m^2 Pt. Status: Room:    3W21C  ADMITTING  Pearson Grippe [id number]   AOZ, HYQMV [id number] Orma Flaming [id number] SONOGRAPHER Nolon Rod, RDCS ATTENDING  [place], [place] [id number] PERFORMING  [facility], Inpatient  cc:  ------------------------------------------------------------------- LV EF: 20% -   25%  ------------------------------------------------------------------- Indications:   Atrial fibrillation - 427.31.  ------------------------------------------------------------------- History:  PMH:  Coronary artery disease. Congestive heart failure. PMH:  Myocardial infarction. Risk factors: Current tobacco use. Hypertension. Diabetes mellitus.  ------------------------------------------------------------------- Study Conclusions  - Left ventricle: The cavity size was normal. Left ventricular geometry showed evidence of eccentric hypertrophy. Systolic function was severely reduced. The estimated ejection fraction was in the range of 20% to 25%. Severe diffuse hypokinesis with no identifiable regional variations. Doppler parameters are consistent with elevated mean left atrial filling pressure. Acoustic contrast opacification revealed no evidence ofthrombus. - Left atrium: The atrium was severely dilated. - Right ventricle: The cavity size was mildly dilated. Systolic function was severely reduced. - Right atrium: The atrium was severely dilated. - Pericardium, extracardiac: There was a left pleural effusion.  ------------------------------------------------------------------- Labs, prior tests, procedures, and surgery: Currently implanted device: implantable cardioverter defibrillator.  Coronary artery bypass grafting. Transthoracic echocardiography. M-mode, complete 2D, spectral Doppler, and color Doppler. Birthdate: Patient birthdate: 30-Dec-1955. Age: Patient is 59 yr old. Sex: Gender: male. BMI: 40.3 kg/m^2. Blood pressure:   136/82 Patient status: Inpatient. Study date: Study date: 08/03/2014. Study time: 12:25 PM. Location: Bedside.  -------------------------------------------------------------------  ------------------------------------------------------------------- Left ventricle: The cavity size was normal. Left  ventricular geometry showed evidence of eccentric hypertrophy. Systolic function was severely reduced. The estimated ejection fraction was in the range of 20% to 25%. Severe diffuse hypokinesis with no identifiable regional variations. Acoustic contrast opacification revealed no evidence ofthrombus. The study was not technically sufficient to allow evaluation of LV diastolic dysfunction due to atrial fibrillation. Doppler parameters are consistent with elevated mean left atrial filling pressure.  ------------------------------------------------------------------- Aortic valve:  Structurally normal valve.  Cusp separation was normal. Doppler: Transvalvular velocity was within the normal range. There was no stenosis. There was no regurgitation.  ------------------------------------------------------------------- Aorta: Aortic root: The aortic root was normal in size. Ascending aorta: The ascending aorta was normal in size.  ------------------------------------------------------------------- Mitral valve:  Structurally normal valve.  Leaflet separation was normal. Doppler: Transvalvular velocity was within the normal range. There was no evidence for stenosis. There was trivial regurgitation.  Peak gradient (D): 4 mm Hg.  ------------------------------------------------------------------- Left atrium: The atrium was severely dilated.  ------------------------------------------------------------------- Right ventricle: The cavity size was mildly dilated. Pacer wire or catheter noted in right ventricle. Systolic function was severely reduced.  ------------------------------------------------------------------- Pulmonic valve:  Poorly visualized.  ------------------------------------------------------------------- Tricuspid valve:  Structurally normal valve.  Leaflet separation was normal. Doppler: Transvalvular velocity was within the normal range. There was no  regurgitation.  ------------------------------------------------------------------- Pulmonary artery:  Systolic pressure could not be accurately estimated.  ------------------------------------------------------------------- Right atrium: The atrium was severely dilated.  ------------------------------------------------------------------- Pericardium: There was no pericardial effusion.  ------------------------------------------------------------------- Pleura: There was a left pleural effusion.    ASSESMENT/PLAN: 58 year  old homeless male with a history of CAD status post CABG 2, severe ischemic cardiomyopathy with EF of 20-25% status post ICD, hypertension, hyperlipidemia, type 2 diabetes mellitus, atrial fibrillation on anticoagulation with Xarelto, PVD recently ruled out for ACS.  Congestive heart failure: EF of 20-25% Currently in fluid overload secondary to running out of Lasix and so I am giving him IV Lasix 40 mg push in the clinic and will observe him for the next 30 minutes after which vitals will be repeated Advised to pickup his Lasix prescription from the pharmacy on his way out. Daily weights, limit fluid to 2 L per day, low-sodium diet.  Carotid Artery disease: Scheduled to see cardiology in 09/11/2014  Atrial fibrillation: Continue Xarelto.  Type 2 diabetes mellitus:  Uncontrolled with A1c of 8.4. CBG in the clinic is 266 but I will hold off on making any changes to his glipizide given he is in the month of Ramadan fast and is likely to suffer from hypoglycemia.  Hypertension: Controlled. Continue medications.  Tachycardia: Likely due to being out of Coreg which I have refilled but at a lower dose due to the fact that he is a bit hypotensive and with his Lasix his BP might drop further.  Tobacco abuse: Smoking cessation support: smoking cessation hotline: 1-800-QUIT-NOW.  Smoking cessation classes are available through [facility]. Call ([phone] [phone] or visit our website at [email or web address].  Spent 3 minutes counseling on smoking cessation and patient is ready to quit. Placed on nicotine patches.       Care Goals: Ensure access to appointments and medication assistance to enable compliance with medications. Housing is a major issue and given he refuses to stay in a shelter, community resources have been provided to him by the case managers.

## 2014-08-29 NOTE — Progress Notes (Signed)
Patient here for hospital follow up (TCC) and to get medication.  Patient states he is short of breath and has fluid in both legs.  Patient has no complaints of pain at this time.  Patient states he is out of Carvedilol, and will pick Furosemide up after visit.  Patient says he is out of Advair inhaler.  Patient states he is ready to quit smoking.  Blood sugar currently 269.

## 2014-08-29 NOTE — Patient Instructions (Signed)

## 2014-08-29 NOTE — Progress Notes (Signed)
Quick Note:  Labs addressed at office as well as medications and patient was made aware. ______ 

## 2014-08-30 ENCOUNTER — Telehealth: Payer: Self-pay

## 2014-08-30 NOTE — Telephone Encounter (Signed)
Called the patient to check on his status.  He said that he has been taking his medications and  his feet are still a little swollen; but he will need to take it for a few more days before he sees any improvement.   He also stated that his breathing is the same, not worse.  CM instructed him to call the [facility] if he has any questions and he verbalized understanding.

## 2014-08-31 ENCOUNTER — Ambulatory Visit: Payer: Medicaid Other | Attending: Family Medicine | Admitting: Family Medicine

## 2014-08-31 ENCOUNTER — Telehealth: Payer: Self-pay

## 2014-08-31 ENCOUNTER — Inpatient Hospital Stay: Payer: Medicaid Other | Admitting: Family Medicine

## 2014-08-31 ENCOUNTER — Encounter: Payer: Self-pay | Admitting: Family Medicine

## 2014-08-31 VITALS — BP 150/85 | HR 137 | Temp 98.5°F | Resp 18 | Wt 225.6 lb

## 2014-08-31 DIAGNOSIS — I5022 Chronic systolic (congestive) heart failure: Secondary | ICD-10-CM

## 2014-08-31 DIAGNOSIS — E1151 Type 2 diabetes mellitus with diabetic peripheral angiopathy without gangrene: Secondary | ICD-10-CM

## 2014-08-31 DIAGNOSIS — E1159 Type 2 diabetes mellitus with other circulatory complications: Secondary | ICD-10-CM

## 2014-08-31 DIAGNOSIS — I5023 Acute on chronic systolic (congestive) heart failure: Secondary | ICD-10-CM

## 2014-08-31 DIAGNOSIS — I10 Essential (primary) hypertension: Secondary | ICD-10-CM

## 2014-08-31 DIAGNOSIS — J189 Pneumonia, unspecified organism: Secondary | ICD-10-CM

## 2014-08-31 DIAGNOSIS — I4891 Unspecified atrial fibrillation: Secondary | ICD-10-CM

## 2014-08-31 LAB — GLUCOSE, POCT (MANUAL RESULT ENTRY): POC Glucose: 279 mg/dl — AB (ref 70–99)

## 2014-08-31 MED ORDER — CARVEDILOL 12.5 MG PO TABS: 12.5000 mg | ORAL_TABLET | Freq: Two times a day (BID) | ORAL | Status: DC

## 2014-08-31 MED ORDER — LEVOFLOXACIN 500 MG PO TABS: 500.0000 mg | ORAL_TABLET | Freq: Every day | ORAL | Status: DC

## 2014-08-31 MED ORDER — FUROSEMIDE 40 MG PO TABS: 40.0000 mg | ORAL_TABLET | Freq: Two times a day (BID) | ORAL | Status: DC

## 2014-08-31 NOTE — Telephone Encounter (Signed)
Spoke to patient to determine if still experiencing shortness of breath, lower extremity swelling. He indicates he is still short of breath but his lower extremity swelling has improved slightly. Patient has appointment scheduled on 09/09/14 but patient wanting to be seen earlier due to persistent shortness of breath.  Patient close to [facility] today and appointment scheduled for 08/31/14 at 1030. Patient appreciative of call and scheduled appointment.

## 2014-08-31 NOTE — Patient Instructions (Signed)

## 2014-08-31 NOTE — Progress Notes (Signed)
Subjective:    Patient ID: Jacob [name], male    DOB: [birth year], 59 y.o.   MRN: [id number]  [facility]  Date of telephone encounter: 08/23/14  First Face to face visit: 08/29/2014  PCP: Dr Hyman Hopes    HPI [name] is a 59 year old homeless male with a history of CAD status post CABG 2, severe ischemic cardiomyopathy with EF of 20-25% status post ICD, hypertension, hyperlipidemia, type 2 diabetes mellitus, atrial fibrillation on anticoagulation with Xarelto, PVD recently hospitalized between 08/18/14 and 08/20/14 for angina and CHF exacerbation.  I had seen him 2 days ago at which time there was evidence of fluid overload as he was dyspneic with elevated JVP and he received 40 mg of IV Lasix in the clinic; his symptoms were as a result of him not picking up his Lasix prescription up to 1 week post discharge. The nurse has called to check on him today and he had complained of still feeling out of breath even though his pedal edema had improved some and so he was advised to come into the clinic today.  His blood pressure is a bit elevated today and he is also tachycardic; I had decreased his Coreg from 25-3.125 mg at his last office visit to prevent hyprtension as he had a normal blood pressure despite not picking up his medications. He complains of continued dry cough, dyspnea on mild exertion, orthopnea, paroxysmal nocturnal dyspnea and states his pedal edema has improved. He denies chest pains, fevers sinus congestion, myalgias.  On one of his recent ED visits in the last month he was prescribed Levaquin for questionable pneumonia which he said he never picked up.  Past Medical History  Diagnosis Date  . Hypertension   . Systolic heart failure     EF is 40-45% by echo, December 2013  . Noncompliance   . CAD (coronary artery disease) Sept 2013    s/p cardiac cath showing occlusion of small RCA with collaterals  . Chronic anticoagulation     on coumadin  . Atrial  fibrillation   . Peripheral arterial disease   . Automatic implantable cardioverter-defibrillator in situ   . High cholesterol   . Myocardial infarction 2014  . Type II diabetes mellitus     Past Surgical History  Procedure Laterality Date  . Implantable cardioverter defibrillator implant      [place] in 07/2012; [employer]  . Coronary artery bypass graft  09/2012    2 vessels per patient [name]   . Coronary angioplasty with stent placement  11/2011    "1"  . Cardiac catheterization  09/2012  . Iliac artery stent Right 08/30/2013  . Left and right heart catheterization with coronary angiogram N/A 12/02/2011    Procedure: LEFT AND RIGHT HEART CATHETERIZATION WITH CORONARY ANGIOGRAM;  Surgeon: Kathleene Hazel, MD;  Location: [facility] CATH LAB;  Service: Cardiovascular;  Laterality: N/A;  . Lower extremity angiogram N/A 08/30/2013    Procedure: LOWER EXTREMITY ANGIOGRAM;  Surgeon: Runell Gess, MD;  Location: [facility] CATH LAB;  Service: Cardiovascular;  Laterality: N/A;  . Lower extremity angiogram N/A 12/02/2013    Procedure: LOWER EXTREMITY ANGIOGRAM;  Surgeon: Runell Gess, MD;  Location: [facility] CATH LAB;  Service: Cardiovascular;  Laterality: N/A;    No Known Allergies  Current Outpatient Prescriptions on File Prior to Visit  Medication Sig Dispense Refill  . glipiZIDE (GLUCOTROL) 5 MG tablet Take 1 tablet (5 mg total) by mouth 2 (two) times daily before  a meal. 60 tablet 0  . nicotine (NICODERM CQ) 7 mg/24hr patch Place 1 patch (7 mg total) onto the skin daily. 28 patch 0  . rivaroxaban (XARELTO) 20 MG TABS tablet Take 1 tablet (20 mg total) by mouth daily before supper. 30 tablet 6  . spironolactone (ALDACTONE) 25 MG tablet Take 1 tablet (25 mg total) by mouth daily. 30 tablet 6   No current facility-administered medications on file prior to visit.     Review of Systems  General: negative for fever, weight loss, appetite change Eyes: no visual symptoms. ENT: no ear  symptoms, no sinus tenderness, no nasal congestion or sore throat. Neck: no pain  Respiratory: see hpi Cardiovascular: see hpi Gastrointestinal: no abdominal pain, no diarrhea, no constipation Genito-Urinary: no urinary frequency, no dysuria, no polyuria. Hematologic: no bruising Endocrine: no cold or heat intolerance Neurological: no headaches, no seizures, no tremors Musculoskeletal: no joint pains, no joint swelling Skin: no pruritus, no rash. Psychological: no depression, no anxiety,       Objective: Filed Vitals:   08/31/14 1046  BP: 150/85  Pulse: 137  Temp: 98.5 F (36.9 C)  TempSrc: Oral  Resp: 18  Weight: 225 lb 9.6 oz (102.331 kg)  SpO2: 97%      Physical Exam  Constitutional: normal appearing,  Eyes: PERRLA HEENT: Head is atraumatic, normal sinuses, normal oropharynx, normal appearing tonsils and palate, tympanic membrane is normal bilaterally. Neck: normal range of motion, no thyromegaly, no JVD Cardiovascular: tachycardic rate and normal rhythm, normal heart sounds, no murmurs, rub or gallop, mild bilateral pedal edema  Respiratory: clear to auscultation bilaterally, no wheezes, no rales, no rhonchi Abdomen: soft, not tender to palpation, normal bowel sounds, no enlarged organs Extremities: Full ROM, no tenderness in joints Skin: warm and dry, no lesions. Neurological: alert, oriented x3, cranial nerves I-XII grossly intact , normal motor strength, normal sensation. Psychological: normal mood.         Assessment & Plan:  59 year old homeless male with a history of CAD status post CABG 2, severe ischemic cardiomyopathy with EF of 20-25% status post ICD, hypertension, hyperlipidemia, type 2 diabetes mellitus, atrial fibrillation on anticoagulation with Xarelto, PVD with symptoms of CHF exacerbation.  Acute on chronic CHF: He has lost 3 pounds in the last 2 days but symptom wise he still has evidence of fluid overload. Lasix increased from 40 mg daily to   twice a day and I have sent off and natruretic peptide. Limit fluid intake to less than 2 L per day, low-sodium diet advised, daily weights. Also scheduled to see cardiology tomorrow.  Hypertension: Uncontrolled likely due to decrease of Coreg at his last office visit. Coreg increased back to 12.5 mg twice a day. We'll reassess blood pressure and heart rate at next visit.  Type 2 diabetes mellitus: Uncontrolled with A1c of 8.4. Due to his current Ramaddan fast I will hold off on making progressive changes to his diabetic medications to prevent hypoglycemia. He is also high risk patient and so his glycemic control will be less strict  Atrial fibrillation: Continue Xarelto.  Coronary artery disease: Status post stent. Continue Xarelto.  Questionable community-acquired pneumonia I have refilled Levaquin given he never picked it up a few weeks ago to treat him presumptively. If symptoms persist I would order a chest x-ray.  Social needs: Refuses to go to shelter for housing and states he has a friend. 17 go to take a shower but otherwise he is still homeless.  This note has been created with Surveyor, quantity. Any transcriptional errors are unintentional.

## 2014-08-31 NOTE — Progress Notes (Signed)
Quick Note:  Labs addressed at office as well as medications and patient was made aware. ______ 

## 2014-08-31 NOTE — Telephone Encounter (Signed)
Call placed to patient to determine if patient picked up new prescriptions (Coreg 12.5 mg bid, Lasix 40 mg bid, Levaquin 500 mg daily).  Patient indicates he did not know he had new prescriptions, but he indicates he will pick up prescriptions tomorrow.  Thoroughly discussed new medication changes with patient and discussed importance of picking up medications as soon as possible. Patient again reiterated that he will pick up medications tomorrow.  Patient informed of appointment on 09/05/14 at 1400. Patient aware of appointment and uncertain if transportation will be needed to appointment.  Will follow-up with patient at a later time.

## 2014-08-31 NOTE — Progress Notes (Signed)
Patient is here for SOB States he does have a cough which is nonproductive  Has not taking any meds for the cough

## 2014-09-01 ENCOUNTER — Telehealth: Payer: Self-pay

## 2014-09-01 ENCOUNTER — Encounter: Payer: Medicaid Other | Admitting: Internal Medicine

## 2014-09-01 LAB — PRO B NATRIURETIC PEPTIDE

## 2014-09-01 NOTE — Telephone Encounter (Signed)
Call placed to patient to check on condition and determine if he picked up new prescriptions (Coreg 12.5 mg bid, Lasix 40 mg bid, Levaquin 500 mg daily).  Unable to reach; left voicemail requesting return call.

## 2014-09-02 ENCOUNTER — Telehealth: Payer: Self-pay

## 2014-09-02 NOTE — Telephone Encounter (Signed)
Call placed patient to check on condition and determine if he picked up new prescriptions (Coreg 12.5 mg bid, Lasix 40 mg bid, Levaquin 500 mg daily). Patient indicates his breathing has improved; he indicates he is not feeling short of breath today.  He also indicates his lower extremity swelling has improved but some swelling remains. Patient indicated he picked up new prescriptions yesterday.  Discussed medications thoroughly, and patient verbalized understanding and indicates he understands importance of compliance. Reminded patient of appointment on 09/05/14 at 1400. Patient indicates he is aware of appointment and will have transportation to appointment.

## 2014-09-05 ENCOUNTER — Telehealth: Payer: Self-pay

## 2014-09-05 ENCOUNTER — Ambulatory Visit: Payer: Medicaid Other | Admitting: Family Medicine

## 2014-09-05 NOTE — Telephone Encounter (Signed)
Nurse called patient because he missed his appointment at 2pm. Patient verified date of birth. Patient was transferred to front office staff to reschedule appointment.

## 2014-09-06 ENCOUNTER — Encounter: Payer: Self-pay | Admitting: Family Medicine

## 2014-09-06 ENCOUNTER — Ambulatory Visit: Payer: Medicaid Other | Attending: Family Medicine | Admitting: Family Medicine

## 2014-09-06 VITALS — BP 103/68 | HR 116 | Temp 97.8°F | Resp 18 | Ht 65.0 in | Wt 224.6 lb

## 2014-09-06 DIAGNOSIS — Z720 Tobacco use: Secondary | ICD-10-CM | POA: Diagnosis not present

## 2014-09-06 DIAGNOSIS — I2510 Atherosclerotic heart disease of native coronary artery without angina pectoris: Secondary | ICD-10-CM

## 2014-09-06 DIAGNOSIS — R05 Cough: Secondary | ICD-10-CM | POA: Diagnosis not present

## 2014-09-06 DIAGNOSIS — I10 Essential (primary) hypertension: Secondary | ICD-10-CM

## 2014-09-06 DIAGNOSIS — I70219 Atherosclerosis of native arteries of extremities with intermittent claudication, unspecified extremity: Secondary | ICD-10-CM | POA: Diagnosis not present

## 2014-09-06 DIAGNOSIS — I5023 Acute on chronic systolic (congestive) heart failure: Secondary | ICD-10-CM | POA: Diagnosis not present

## 2014-09-06 DIAGNOSIS — E1159 Type 2 diabetes mellitus with other circulatory complications: Secondary | ICD-10-CM | POA: Diagnosis present

## 2014-09-06 DIAGNOSIS — R059 Cough, unspecified: Secondary | ICD-10-CM

## 2014-09-06 DIAGNOSIS — E1151 Type 2 diabetes mellitus with diabetic peripheral angiopathy without gangrene: Secondary | ICD-10-CM

## 2014-09-06 LAB — GLUCOSE, POCT (MANUAL RESULT ENTRY): POC Glucose: 232 mg/dl — AB (ref 70–99)

## 2014-09-06 LAB — BASIC METABOLIC PANEL
BUN: 30 mg/dL — ABNORMAL HIGH (ref 6–23)
CALCIUM: 8.7 mg/dL (ref 8.4–10.5)
CO2: 27 mEq/L (ref 19–32)
Chloride: 100 mEq/L (ref 96–112)
Creat: 1.23 mg/dL (ref 0.50–1.35)
Glucose, Bld: 204 mg/dL — ABNORMAL HIGH (ref 70–99)
Potassium: 4.1 mEq/L (ref 3.5–5.3)
SODIUM: 137 meq/L (ref 135–145)

## 2014-09-06 MED ORDER — FLUTICASONE-SALMETEROL 100-50 MCG/DOSE IN AEPB: 1.0000 | INHALATION_SPRAY | Freq: Two times a day (BID) | RESPIRATORY_TRACT | Status: DC

## 2014-09-06 MED ORDER — ALBUTEROL SULFATE (2.5 MG/3ML) 0.083% IN NEBU: 2.5000 mg | INHALATION_SOLUTION | Freq: Four times a day (QID) | RESPIRATORY_TRACT | Status: DC | PRN

## 2014-09-06 NOTE — Progress Notes (Signed)
Subjective:    Patient ID: Jacob [name], male    DOB: 1956/02/08, 59 y.o.   MRN: [id number]   [facility]  Date of telephone encounter: 08/23/14  First Face to face visit: 08/29/2014  PCP: Dr Hyman Hopes    HPI Jacob [name] is a 59 year old homeless male with a history of CAD status post CABG 2, severe ischemic cardiomyopathy with EF of 20-25% status post ICD, hypertension, hyperlipidemia, type 2 diabetes mellitus, atrial fibrillation on anticoagulation with Xarelto, PVD recently hospitalized between 08/18/14 and 08/20/14 for angina and CHF exacerbation.  He has been compliant with his medications and reports improvement in his dyspnea and pedal edema since Lasix was increased from daily to twice daily dosing but he still complains of cough which is not productive. I have placed him on Levaquin at his last office visit which he is almost done with. He endorses a history of smoking 5-6 sticks of cigarettes a day and had previously informed me he was once on Advair which he is not using at this time.  His appointment with cardiology was postponed to 09/13/14  Past Medical History  Diagnosis Date  . Hypertension   . Systolic heart failure     EF is 40-45% by echo, December 2013  . Noncompliance   . CAD (coronary artery disease) Sept 2013    s/p cardiac cath showing occlusion of small RCA with collaterals  . Chronic anticoagulation     on coumadin  . Atrial fibrillation   . Peripheral arterial disease   . Automatic implantable cardioverter-defibrillator in situ   . High cholesterol   . Myocardial infarction 2014  . Type II diabetes mellitus     Past Surgical History  Procedure Laterality Date  . Implantable cardioverter defibrillator implant      [place] in 07/2012; [employer]  . Coronary artery bypass graft  09/2012    2 vessels per patient [name]   . Coronary angioplasty with stent placement  11/2011    "1"  . Cardiac catheterization  09/2012  . Iliac artery  stent Right 08/30/2013  . Left and right heart catheterization with coronary angiogram N/A 12/02/2011    Procedure: LEFT AND RIGHT HEART CATHETERIZATION WITH CORONARY ANGIOGRAM;  Surgeon: Kathleene Hazel, MD;  Location: [facility] CATH LAB;  Service: Cardiovascular;  Laterality: N/A;  . Lower extremity angiogram N/A 08/30/2013    Procedure: LOWER EXTREMITY ANGIOGRAM;  Surgeon: Runell Gess, MD;  Location: [facility] CATH LAB;  Service: Cardiovascular;  Laterality: N/A;  . Lower extremity angiogram N/A 12/02/2013    Procedure: LOWER EXTREMITY ANGIOGRAM;  Surgeon: Runell Gess, MD;  Location: [facility] CATH LAB;  Service: Cardiovascular;  Laterality: N/A;    History   Social History  . Marital Status: Divorced    Spouse Name: N/A  . Number of Children: 3  . Years of Education: N/A   Occupational History  . Unemployed    Social History Main Topics  . Smoking status: Current Every Day Smoker -- 0.75 packs/day for 38 years    Types: Cigarettes  . Smokeless tobacco: Never Used  . Alcohol Use: No  . Drug Use: No  . Sexual Activity: Yes   Other Topics Concern  . Not on file   Social History Narrative   Has an apartment with a roommate. He was living on the streets in 12-26-12.  He reports that his father died in [place] in Dec 26, 2012.  He is divorced.  He is no  longer estranged from his son, but still from his daughter who lives locally.  Neither of his parents, nor any siblings have any history of CAD.    No Known Allergies  Current Outpatient Prescriptions on File Prior to Visit  Medication Sig Dispense Refill  . carvedilol (COREG) 12.5 MG tablet Take 1 tablet (12.5 mg total) by mouth 2 (two) times daily. 60 tablet 2  . furosemide (LASIX) 40 MG tablet Take 1 tablet (40 mg total) by mouth 2 (two) times daily. 30 tablet 2  . glipiZIDE (GLUCOTROL) 5 MG tablet Take 1 tablet (5 mg total) by mouth 2 (two) times daily before a meal. 60 tablet 0  . levofloxacin (LEVAQUIN) 500 MG tablet Take 1  tablet (500 mg total) by mouth daily. 7 tablet 0  . nicotine (NICODERM CQ) 7 mg/24hr patch Place 1 patch (7 mg total) onto the skin daily. 28 patch 0  . rivaroxaban (XARELTO) 20 MG TABS tablet Take 1 tablet (20 mg total) by mouth daily before supper. 30 tablet 6  . spironolactone (ALDACTONE) 25 MG tablet Take 1 tablet (25 mg total) by mouth daily. 30 tablet 6   No current facility-administered medications on file prior to visit.    Review of Systems  General: negative for fever, weight loss, appetite change Eyes: no visual symptoms. ENT: no ear symptoms, no sinus tenderness, no nasal congestion or sore throat. Neck: no pain  Respiratory: no wheezing, shortness of breath, positive for cough Cardiovascular: no chest pain, positive for mild dyspnea on exertion, no pedal edema, no orthopnea. Gastrointestinal: no abdominal pain, no diarrhea, no constipation Genito-Urinary: no urinary frequency, no dysuria, no polyuria. Hematologic: no bruising Endocrine: no cold or heat intolerance Neurological: no headaches, no seizures, no tremors Musculoskeletal: no joint pains, no joint swelling Skin: no pruritus, no rash. Psychological: no depression, no anxiety,      Objective: Filed Vitals:   09/06/14 1007  BP: 103/68  Pulse: 116  Temp: 97.8 F (36.6 C)  TempSrc: Oral  Resp: 18  Height: 5\' 5"  (1.651 m)  Weight: 224 lb 9.6 oz (101.878 kg)  SpO2: 99%      Physical Exam  Constitutional: normal appearing,  Eyes: PERRLA HEENT: Head is atraumatic, normal sinuses, normal oropharynx, normal appearing tonsils and palate, tympanic membrane is normal bilaterally. Neck: normal range of motion, no thyromegaly, no JVD Cardiovascular: tachycardic rate, regular rhythm, normal heart sounds, no murmurs, rub or gallop, no pedal edema Respiratory: clear to auscultation bilaterally, no wheezes, no rales, no rhonchi Abdomen: soft, not tender to palpation, normal bowel sounds, no enlarged  organs Extremities: Full ROM, no tenderness in joints Skin: warm and dry, no lesions. Neurological: alert, oriented x3, cranial nerves I-XII grossly intact , normal motor strength, normal sensation. Psychological: normal mood.         Assessment & Plan:  59 year old homeless male with a history of CAD status post CABG 2, severe ischemic cardiomyopathy with EF of 20-25% status post ICD, hypertension, hyperlipidemia, type 2 diabetes mellitus, atrial fibrillation on anticoagulation with Xarelto, PVD with symptoms of CHF exacerbation.  Acute on chronic CHF: Weight is stable  continue Lasix 40 mg twice daily and repeat renal function as well as natruretic peptide today Limit fluid intake to less than 2 L per day, low-sodium diet advised, daily weights.   Hypertension: Controlled on Coreg12.5 mg twice a day.   Type 2 diabetes mellitus: Uncontrolled with A1c of 8.4. Due to his current Ramaddan fast I will hold  off on making progressive changes to his diabetic medications to prevent hypoglycemia. He is also high risk patient and so his glycemic control will be less strict  Atrial fibrillation: Continue anticoagulation with Xarelto, rate control with Coreg.  Coronary artery disease: Status post stent.   Cough: Will need to exclude emphysema given history of smoking. Placed on Advair and Proventil presumptively.  Tobacco abuse: Smoking cessation support: smoking cessation hotline: 1-800-QUIT-NOW.  Smoking cessation classes are available through [facility] and [facility]. Call [phone] or visit our website at [email or web address].  Spent 4 minutes counseling on smoking cessation and patient is not ready to quit.  Social needs: Refuses to go to shelter for housing and states he has a friend. 16 go to take a shower but otherwise he is still homeless.   This note has been created with Education officer, environmental. Any transcriptional  errors are unintentional.

## 2014-09-06 NOTE — Progress Notes (Signed)
Patient is here for follow up for CHF. Patient reports that he feels fine. Patient reports that he is having constant pain in both of his lower legs today rating it at a 6 and describing it as stabbing. Patient reports that his legs have been in pain for a week now. Patient states that he feels very tired and it has been occurring for the last 2-3 days.

## 2014-09-07 ENCOUNTER — Other Ambulatory Visit: Payer: Self-pay | Admitting: Family Medicine

## 2014-09-07 ENCOUNTER — Telehealth: Payer: Self-pay

## 2014-09-07 LAB — PRO B NATRIURETIC PEPTIDE: PRO B NATRI PEPTIDE: 1577 pg/mL — AB (ref <126)

## 2014-09-07 MED ORDER — FUROSEMIDE 40 MG PO TABS: 80.0000 mg | ORAL_TABLET | Freq: Two times a day (BID) | ORAL | Status: DC

## 2014-09-07 NOTE — Telephone Encounter (Signed)
-----   Message from Jaclyn Shaggy, MD sent at 09/07/2014  3:04 PM EDT ----- Please inform him his labs reveal he still has evidence of fluid overload and so i would need him to increase his Lasix to 80mg  twice daily. He could take two 40mg  pills twice daily which he has.

## 2014-09-07 NOTE — Telephone Encounter (Signed)
Nurse called patient, patient verified date of birth. Patient aware of labs revealing fluid overload. Patient agrees to take Lasix 80mg  twice a day. Patient voices understanding of taking two 40mg  Lasix pills twice a day. Patient has no questions at this time.

## 2014-09-07 NOTE — Telephone Encounter (Signed)
-----   Message from Jaclyn Shaggy, MD sent at 09/06/2014 10:56 PM EDT ----- Please inform him his kidney function is good. Advise to comply with ADA diet and medication

## 2014-09-07 NOTE — Telephone Encounter (Signed)
Nurse called patient, patient verified date of birth. Patient aware of good kidney function. Patient agrees to comply with ADA diet and medication. Patient voices understanding and has no questions at this time.

## 2014-09-09 ENCOUNTER — Ambulatory Visit: Payer: Medicaid Other | Admitting: Family Medicine

## 2014-09-13 ENCOUNTER — Encounter: Payer: Self-pay | Admitting: Internal Medicine

## 2014-09-13 ENCOUNTER — Ambulatory Visit (INDEPENDENT_AMBULATORY_CARE_PROVIDER_SITE_OTHER): Payer: Self-pay | Admitting: Internal Medicine

## 2014-09-13 VITALS — BP 140/72 | HR 125 | Ht 65.0 in | Wt 226.2 lb

## 2014-09-13 DIAGNOSIS — I10 Essential (primary) hypertension: Secondary | ICD-10-CM

## 2014-09-13 DIAGNOSIS — I482 Chronic atrial fibrillation, unspecified: Secondary | ICD-10-CM

## 2014-09-13 DIAGNOSIS — I2510 Atherosclerotic heart disease of native coronary artery without angina pectoris: Secondary | ICD-10-CM

## 2014-09-13 DIAGNOSIS — Z95810 Presence of automatic (implantable) cardiac defibrillator: Secondary | ICD-10-CM

## 2014-09-13 DIAGNOSIS — I2583 Coronary atherosclerosis due to lipid rich plaque: Secondary | ICD-10-CM

## 2014-09-13 LAB — CUP PACEART INCLINIC DEVICE CHECK
Brady Statistic RV Percent Paced: 1 %
HighPow Impedance: 50 Ohm
HighPow Impedance: 78 Ohm
Lead Channel Sensing Intrinsic Amplitude: 13.8 mV
Lead Channel Setting Pacing Amplitude: 2 V
Lead Channel Setting Sensing Sensitivity: 0.6 mV
MDC IDC MSMT LEADCHNL RV IMPEDANCE VALUE: 423 Ohm
MDC IDC SESS DTM: 20160621040000
MDC IDC SET LEADCHNL RV PACING PULSEWIDTH: 0.4 ms
MDC IDC SET ZONE DETECTION INTERVAL: 273 ms
MDC IDC SET ZONE DETECTION INTERVAL: 353 ms
Pulse Gen Serial Number: 108719

## 2014-09-13 MED ORDER — METOPROLOL TARTRATE 50 MG PO TABS
50.0000 mg | ORAL_TABLET | Freq: Two times a day (BID) | ORAL | Status: DC
Start: 2014-09-13 — End: 2014-09-21

## 2014-09-13 NOTE — Assessment & Plan Note (Signed)
He denies anginal symptoms will continue is current meds.

## 2014-09-13 NOTE — Assessment & Plan Note (Signed)
He has had no ICD shock. Will recheck in several months.

## 2014-09-13 NOTE — Assessment & Plan Note (Signed)
His blood pressure is elevated. His rate is up. Will try metoprolol in higher doses compared to coreg.

## 2014-09-13 NOTE — Progress Notes (Signed)
HPI Mr. Navejas returns today for followup. He is a pleasant 59 yo man with an ICM, s/p CABG with HTN and dyslipidemia and atrial fib. He is s/p ICD, it sounds like due to sudden death, though this is not well documented. He had his ICD placed in [place] when he was visiting friends over a year ago. He has no memory of the event. He feels well except for severe symptoms of claudication. He was hospitalized several weeks ago with pneumonia. He remains sob and has had peripheral edema. His cough persists but his fever and chills have resolved. No Known Allergies   Current Outpatient Prescriptions  Medication Sig Dispense Refill  . carvedilol (COREG) 12.5 MG tablet Take 1 tablet (12.5 mg total) by mouth 2 (two) times daily. 60 tablet 2  . furosemide (LASIX) 40 MG tablet Take 2 tablets (80 mg total) by mouth 2 (two) times daily. 60 tablet 1  . glipiZIDE (GLUCOTROL) 5 MG tablet Take 1 tablet (5 mg total) by mouth 2 (two) times daily before a meal. 60 tablet 0  . nicotine (NICODERM CQ) 7 mg/24hr patch Place 1 patch (7 mg total) onto the skin daily. 28 patch 0  . rivaroxaban (XARELTO) 20 MG TABS tablet Take 1 tablet (20 mg total) by mouth daily before supper. 30 tablet 6  . spironolactone (ALDACTONE) 25 MG tablet Take 1 tablet (25 mg total) by mouth daily. 30 tablet 6  . albuterol (PROVENTIL) (2.5 MG/3ML) 0.083% nebulizer solution Take 3 mLs (2.5 mg total) by nebulization every 6 (six) hours as needed for wheezing or shortness of breath. (Patient not taking: Reported on 09/13/2014) 150 mL 1   No current facility-administered medications for this visit.     Past Medical History  Diagnosis Date  . Hypertension   . Systolic heart failure     EF is 40-45% by echo, December 2013  . Noncompliance   . CAD (coronary artery disease) Sept 2013    s/p cardiac cath showing occlusion of small RCA with collaterals  . Chronic anticoagulation     on coumadin  . Atrial fibrillation   . Peripheral  arterial disease   . Automatic implantable cardioverter-defibrillator in situ   . High cholesterol   . Myocardial infarction 2014  . Type II diabetes mellitus     ROS:   All systems reviewed and negative except as noted in the HPI.   Past Surgical History  Procedure Laterality Date  . Implantable cardioverter defibrillator implant      [place] in 07/2012; [employer]  . Coronary artery bypass graft  09/2012    2 vessels per patient [name]   . Coronary angioplasty with stent placement  11/2011    "1"  . Cardiac catheterization  09/2012  . Iliac artery stent Right 08/30/2013  . Left and right heart catheterization with coronary angiogram N/A 12/02/2011    Procedure: LEFT AND RIGHT HEART CATHETERIZATION WITH CORONARY ANGIOGRAM;  Surgeon: Kathleene Hazel, MD;  Location: [facility] CATH LAB;  Service: Cardiovascular;  Laterality: N/A;  . Lower extremity angiogram N/A 08/30/2013    Procedure: LOWER EXTREMITY ANGIOGRAM;  Surgeon: Runell Gess, MD;  Location: [facility] CATH LAB;  Service: Cardiovascular;  Laterality: N/A;  . Lower extremity angiogram N/A 12/02/2013    Procedure: LOWER EXTREMITY ANGIOGRAM;  Surgeon: Runell Gess, MD;  Location: [facility] CATH LAB;  Service: Cardiovascular;  Laterality: N/A;     Family History  Problem Relation Age of Onset  .  Diabetes Mother      History   Social History  . Marital Status: Divorced    Spouse Name: N/A  . Number of Children: 3  . Years of Education: N/A   Occupational History  . Unemployed    Social History Main Topics  . Smoking status: Current Every Day Smoker -- 0.75 packs/day for 38 years    Types: Cigarettes  . Smokeless tobacco: Never Used  . Alcohol Use: No  . Drug Use: No  . Sexual Activity: Yes   Other Topics Concern  . Not on file   Social History Narrative   Has an apartment with a roommate. He was living on the streets in 01-21-13.  He reports that his father died in [place] in 21-Jan-2013.  He is divorced.  He  is no longer estranged from his son, but still from his daughter who lives locally.  Neither of his parents, nor any siblings have any history of CAD.     BP 140/72 mmHg  Pulse 125  Ht  (1.651 m)  Wt 226 lb 3.2 oz (102.604 kg)  BMI 37.64 kg/m2  Physical Exam:  stable appearing 59 yo man, NAD HEENT: Unremarkable Neck:  No JVD, no thyromegally Back:  No CVA tenderness Lungs:  Scattered rales in the bases but no increased work of breathing HEART:  IRegular rate rhythm, no murmurs, no rubs, no clicks Abd:  soft, positive bowel sounds, no organomegally, no rebound, no guarding Ext:  trace pulses in feet, no edema, no cyanosis, no clubbing Skin:  No rashes no nodules Neuro:  CN II through XII intact, motor grossly intact  EKG - atrial fib  DEVICE  Normal device function.  See PaceArt for details.   Assess/Plan:

## 2014-09-13 NOTE — Assessment & Plan Note (Signed)
His ventricular rate is not well controlled. I would like for him to stop coreg and start metoprolol 50 mg bid and come back for an ecg in 2 weeks. He may need uptitration of his beta blocker and or Digoxin. He has been out of rhythm for over 15 months.

## 2014-09-13 NOTE — Patient Instructions (Signed)
Medication Instructions:  Your physician has recommended you make the following change in your medication:  1) Stop Carvedilol 2) Start Metoprolol 50 mg twice daily   Labwork: None ordered  Testing/Procedures: None ordered  Follow-Up: Your physician recommends that you schedule a follow-up appointment in: 2 weeks in nurse room for an EKG and dose titration on Metoprolol   Your physician recommends that you schedule a follow-up appointment in: 3 months in the device clinic  Your physician wants you to follow-up in: 6 months with Dr Court Joy will receive a reminder letter in the mail two months in advance. If you don't receive a letter, please call our office to schedule the follow-up appointment.    Any Other Special Instructions Will Be Listed Below (If Applicable).

## 2014-09-15 ENCOUNTER — Other Ambulatory Visit: Payer: Self-pay

## 2014-09-15 DIAGNOSIS — I739 Peripheral vascular disease, unspecified: Secondary | ICD-10-CM

## 2014-09-16 ENCOUNTER — Telehealth: Payer: Self-pay

## 2014-09-16 NOTE — Telephone Encounter (Signed)
Call placed to patient to follow-up on condition, to determine if patient taking medications as prescribed, and to remind him of [facility] at 1115 with Dr. Venetia Night. Placed call to patient's cell [phone]. Unable to reach patient; left voicemail requesting return call.

## 2014-09-19 ENCOUNTER — Telehealth: Payer: Self-pay

## 2014-09-19 NOTE — Telephone Encounter (Signed)
Called the patient at # [phone] and confirmed his appointment for tomorrow - 09/20/14  @ 11:15am.  He said that he will be at the clinic and has a friend that can transport him. He noted that he still has some swelling of his feet and he  is taking his medications, including his lasix as ordered. He said that he will bring all of his medications to his appointment tomorrow. No other problems/concerns reported.

## 2014-09-20 ENCOUNTER — Ambulatory Visit: Payer: Medicaid Other | Attending: Family Medicine | Admitting: Family Medicine

## 2014-09-20 ENCOUNTER — Encounter: Payer: Self-pay | Admitting: Family Medicine

## 2014-09-20 VITALS — BP 116/76 | HR 116 | Temp 97.9°F | Resp 18 | Ht 64.0 in | Wt 229.8 lb

## 2014-09-20 DIAGNOSIS — M25562 Pain in left knee: Secondary | ICD-10-CM

## 2014-09-20 DIAGNOSIS — I2510 Atherosclerotic heart disease of native coronary artery without angina pectoris: Secondary | ICD-10-CM | POA: Diagnosis not present

## 2014-09-20 DIAGNOSIS — Z720 Tobacco use: Secondary | ICD-10-CM

## 2014-09-20 DIAGNOSIS — I482 Chronic atrial fibrillation, unspecified: Secondary | ICD-10-CM

## 2014-09-20 DIAGNOSIS — M25561 Pain in right knee: Secondary | ICD-10-CM | POA: Insufficient documentation

## 2014-09-20 DIAGNOSIS — Z95810 Presence of automatic (implantable) cardiac defibrillator: Secondary | ICD-10-CM

## 2014-09-20 DIAGNOSIS — E139 Other specified diabetes mellitus without complications: Secondary | ICD-10-CM

## 2014-09-20 DIAGNOSIS — I5043 Acute on chronic combined systolic (congestive) and diastolic (congestive) heart failure: Secondary | ICD-10-CM | POA: Diagnosis not present

## 2014-09-20 DIAGNOSIS — R0600 Dyspnea, unspecified: Secondary | ICD-10-CM

## 2014-09-20 DIAGNOSIS — I2583 Coronary atherosclerosis due to lipid rich plaque: Secondary | ICD-10-CM

## 2014-09-20 LAB — BASIC METABOLIC PANEL
BUN: 23 mg/dL (ref 6–23)
CHLORIDE: 101 meq/L (ref 96–112)
CO2: 26 mEq/L (ref 19–32)
Calcium: 8.5 mg/dL (ref 8.4–10.5)
Creat: 1.08 mg/dL (ref 0.50–1.35)
Glucose, Bld: 110 mg/dL — ABNORMAL HIGH (ref 70–99)
POTASSIUM: 4.8 meq/L (ref 3.5–5.3)
SODIUM: 138 meq/L (ref 135–145)

## 2014-09-20 LAB — GLUCOSE, POCT (MANUAL RESULT ENTRY): POC Glucose: 120 mg/dl — AB (ref 70–99)

## 2014-09-20 MED ORDER — TRAMADOL HCL 50 MG PO TABS: 50.0000 mg | ORAL_TABLET | Freq: Three times a day (TID) | ORAL | Status: DC | PRN

## 2014-09-20 MED ORDER — ALBUTEROL SULFATE HFA 108 (90 BASE) MCG/ACT IN AERS: 2.0000 | INHALATION_SPRAY | Freq: Four times a day (QID) | RESPIRATORY_TRACT | Status: DC | PRN

## 2014-09-20 NOTE — Patient Instructions (Signed)

## 2014-09-20 NOTE — Progress Notes (Signed)
Patient here for follow up. Patient reports he does not feel good, he feels bad. Patient reports he feels really tired and is having SOB. Patient expresses pain in both knees. Patient reports pain in his chest when he coughs. Patient reports pain today in knees rated at an 8, described as stabbing. Pain is constant.   Patient reports that he has albuterol nebulizer solution but does not have a nebulizer machine in order to use it.   Patient has taken all medications today.   Patient CBG is 120.  Patient request to talk to the social worker today.

## 2014-09-20 NOTE — Progress Notes (Signed)
ASSESSMENT: Pt currently experiencing homelessness. Pt needs to f/u with PCP. Pt would benefit from community resources.  Stage of Change: precontemplative  PLAN: 1. F/U with behavioral health consultant in as needed 2. Psychiatric Medications: none 3. Behavioral recommendation(s):   -Agree to enroll in [facility] again  SUBJECTIVE: Pt. referred by Dr Venetia Night for homelessness:  Pt. here for referral regarding homelessness.  Pt. reports the following symptoms/concerns: Pt states that he is homeless, and that he doesn't want to go to the shelter; he says he used to be in the [employer], and wants to be in it again. He does not need food at this time.  Duration of problem: >one month Severity: moderate  OBJECTIVE: Orientation & Cognition: Oriented x3. Thought processes normal and appropriate to situation. Mood: appropriate. Affect: appropriate Appearance: appropriate Risk of harm to self or others: no risk of harm to self or others Substance use: none Psychiatric medication use: Unchanged from prior contact. Assessments administered: none  Diagnosis: Problem related to psychosocial circumstances CPT Code: Z65.9 -------------------------------------------- Other(s) present in the room: none  Time spent with patient in exam room: 16 minutes

## 2014-09-20 NOTE — Progress Notes (Addendum)
Subjective:    Patient ID: Jacob [name], male    DOB: [birth year], 59 y.o.   MRN: [id number]  HPI   59 year old homeless male with a history of CAD status post CABG 2, severe ischemic cardiomyopathy with EF of 20-25% status post ICD, hypertension, hyperlipidemia, type 2 diabetes mellitus, atrial fibrillation on anticoagulation with Xarelto, PVD for follow-up visit. He has been on Lasix 80 mg twice a day (which was increased from 40mg  bid) due to elevated pro BNP of 1577 and reports compliance with it.  He was seen by cardiology on 09/13/14 and his Coreg was substituted with metoprolol and the plan was for repeat EKG in 2 weeks He complains of cough, pleuritic chest pain, dyspnea on mild exertion. Has pain in both knees which is chronic and worsening.  He also continues to smoke 5-6 sticks of cigarettes a day.   Past Medical History  Diagnosis Date  . Hypertension   . Systolic heart failure     EF is 40-45% by echo, December 2013  . Noncompliance   . CAD (coronary artery disease) Sept 2013    s/p cardiac cath showing occlusion of small RCA with collaterals  . Chronic anticoagulation     on coumadin  . Atrial fibrillation   . Peripheral arterial disease   . Automatic implantable cardioverter-defibrillator in situ   . High cholesterol   . Myocardial infarction 2014  . Type II diabetes mellitus     Past Surgical History  Procedure Laterality Date  . Implantable cardioverter defibrillator implant      [place] in 07/2012; [employer]  . Coronary artery bypass graft  09/2012    2 vessels per patient [name]   . Coronary angioplasty with stent placement  11/2011    "1"  . Cardiac catheterization  09/2012  . Iliac artery stent Right 08/30/2013  . Left and right heart catheterization with coronary angiogram N/A 12/02/2011    Procedure: LEFT AND RIGHT HEART CATHETERIZATION WITH CORONARY ANGIOGRAM;  Surgeon: Kathleene Hazel, MD;  Location: [facility] CATH LAB;  Service: Cardiovascular;   Laterality: N/A;  . Lower extremity angiogram N/A 08/30/2013    Procedure: LOWER EXTREMITY ANGIOGRAM;  Surgeon: Runell Gess, MD;  Location: [facility] CATH LAB;  Service: Cardiovascular;  Laterality: N/A;  . Lower extremity angiogram N/A 12/02/2013    Procedure: LOWER EXTREMITY ANGIOGRAM;  Surgeon: Runell Gess, MD;  Location[facility] CATH LAB;  Service: Cardiovascular;  Laterality: N/A;    History   Social History  . Marital Status: Divorced    Spouse Name: N/A  . Number of Children: 3  . Years of Education: N/A   Occupational History  . Unemployed    Social History Main Topics  . Smoking status: Current Every Day Smoker -- 0.75 packs/day for 38 years    Types: Cigarettes  . Smokeless tobacco: Never Used  . Alcohol Use: No  . Drug Use: No  . Sexual Activity: Yes   Other Topics Concern  . Not on file   Social History Narrative   Has an apartment with a roommate. He was living on the streets in February 05, 2013.  He reports that his father died in [place] in 05-Feb-2013.  He is divorced.  He is no longer estranged from his son, but still from his daughter who lives locally.  Neither of his parents, nor any siblings have any history of CAD.    No Known Allergies  Current Outpatient Prescriptions on File Prior to  Visit  Medication Sig Dispense Refill  . furosemide (LASIX) 40 MG tablet Take 2 tablets (80 mg total) by mouth 2 (two) times daily. 60 tablet 1  . glipiZIDE (GLUCOTROL) 5 MG tablet Take 1 tablet (5 mg total) by mouth 2 (two) times daily before a meal. 60 tablet 0  . metoprolol (LOPRESSOR) 50 MG tablet Take 1 tablet (50 mg total) by mouth 2 (two) times daily. 180 tablet 3  . nicotine (NICODERM CQ) 7 mg/24hr patch Place 1 patch (7 mg total) onto the skin daily. 28 patch 0  . rivaroxaban (XARELTO) 20 MG TABS tablet Take 1 tablet (20 mg total) by mouth daily before supper. 30 tablet 6  . spironolactone (ALDACTONE) 25 MG tablet Take 1 tablet (25 mg total) by mouth daily. 30 tablet 6    . albuterol (PROVENTIL) (2.5 MG/3ML) 0.083% nebulizer solution Take 3 mLs (2.5 mg total) by nebulization every 6 (six) hours as needed for wheezing or shortness of breath. (Patient not taking: Reported on 09/13/2014) 150 mL 1   No current facility-administered medications on file prior to visit.     Review of Systems  General: negative for fever, weight loss, appetite change Eyes: no visual symptoms. ENT: no ear symptoms, no sinus tenderness, no nasal congestion or sore throat. Neck: no pain  Respiratory: no wheezing, positive for shortness of breath, cough Cardiovascular: see hpi Gastrointestinal: no abdominal pain, no diarrhea, no constipation Genito-Urinary: no urinary frequency, no dysuria, no polyuria. Hematologic: no bruising Endocrine: no cold or heat intolerance Neurological: no headaches, no seizures, no tremors Musculoskeletal: see hpi      Objective: Filed Vitals:   09/20/14 1102 09/20/14 1106  BP:  116/76  Pulse:  116  Temp:  97.9 F (36.6 C)  TempSrc:  Oral  Resp:  18  Height:  (1.626 m)   Weight: 229 lb 12.8 oz (104.237 kg)   SpO2:  98%      Physical Exam  Constitutional: He is oriented to person, place, and time. He appears well-developed and well-nourished.  HENT:  Head: Normocephalic and atraumatic.  Right Ear: External ear normal.  Left Ear: External ear normal.  Eyes: Conjunctivae and EOM are normal. Pupils are equal, round, and reactive to light.  Neck: Normal range of motion. Neck supple. JVD present. No tracheal deviation present.  Cardiovascular: Normal heart sounds and normal pulses.  An irregularly irregular rhythm present. Tachycardia present.   No murmur heard. Pulmonary/Chest: Effort normal and breath sounds normal. No respiratory distress. He has no wheezes. He exhibits no tenderness.  Abdominal: Soft. Bowel sounds are normal. He exhibits no mass. There is no tenderness.  Musculoskeletal: Normal range of motion. He exhibits edema. He  exhibits no tenderness.  1+pedal edema bilaterally  Neurological: He is alert and oriented to person, place, and time.  Skin: Skin is warm and dry.  Psychiatric: He has a normal mood and affect.             Assessment & Plan:  59 year old homeless male with a history of CAD status post CABG 2, severe ischemic cardiomyopathy with EF of 20-25% status post ICD, hypertension, hyperlipidemia, type 2 diabetes mellitus, atrial fibrillation on anticoagulation with Xarelto, PVD with symptoms of CHF exacerbation.  Acute on chronic CHF: EF of 20-25% Gained 5 lbs in last 2 weeks ProBNP sent off and will adjust Lasix as needed.  Dyspnea: PFT to exclude COPD given h/o smoking. Will place on Advair if needed  Hypertension: Controlled on  metoprolol 50 mg twice a day.   Type 2 diabetes mellitus: Uncontrolled with A1c of 8.4. Due to his current Ramaddan fast I will hold off on making progressive changes to his diabetic medications to prevent hypoglycemia. He is also high risk patient and so his glycemic control will be less strict  Atrial fibrillation: Continue anticoagulation with Xarelto, rate control with metoprolol  Coronary artery disease: Status post stent.  Knee pain: Secondary to underlying arthropathy Placed on Tramadol as NSAIDS will worsen cardiac function  Tobacco abuse: Spent 3 minutes counseling and he is not ready to quit.  This note has been created with Education officer, environmental. Any transcriptional errors are unintentional.

## 2014-09-21 ENCOUNTER — Ambulatory Visit (HOSPITAL_BASED_OUTPATIENT_CLINIC_OR_DEPARTMENT_OTHER): Payer: Medicaid Other | Admitting: Family Medicine

## 2014-09-21 ENCOUNTER — Encounter: Payer: Self-pay | Admitting: Family Medicine

## 2014-09-21 ENCOUNTER — Ambulatory Visit: Payer: Self-pay | Admitting: *Deleted

## 2014-09-21 ENCOUNTER — Telehealth: Payer: Self-pay | Admitting: *Deleted

## 2014-09-21 ENCOUNTER — Ambulatory Visit (HOSPITAL_COMMUNITY)
Admission: RE | Admit: 2014-09-21 | Discharge: 2014-09-21 | Disposition: A | Discharge status: Home / Self Care | Payer: Medicaid Other | Source: Ambulatory Visit | Attending: Family Medicine | Admitting: Family Medicine

## 2014-09-21 VITALS — BP 159/82 | HR 136 | Temp 98.0°F | Resp 18

## 2014-09-21 DIAGNOSIS — I5043 Acute on chronic combined systolic (congestive) and diastolic (congestive) heart failure: Secondary | ICD-10-CM | POA: Diagnosis not present

## 2014-09-21 DIAGNOSIS — R0600 Dyspnea, unspecified: Secondary | ICD-10-CM

## 2014-09-21 DIAGNOSIS — I2510 Atherosclerotic heart disease of native coronary artery without angina pectoris: Secondary | ICD-10-CM

## 2014-09-21 DIAGNOSIS — R05 Cough: Secondary | ICD-10-CM | POA: Insufficient documentation

## 2014-09-21 DIAGNOSIS — I5023 Acute on chronic systolic (congestive) heart failure: Secondary | ICD-10-CM | POA: Diagnosis not present

## 2014-09-21 DIAGNOSIS — I10 Essential (primary) hypertension: Secondary | ICD-10-CM | POA: Diagnosis not present

## 2014-09-21 DIAGNOSIS — E1151 Type 2 diabetes mellitus with diabetic peripheral angiopathy without gangrene: Secondary | ICD-10-CM

## 2014-09-21 DIAGNOSIS — Z95810 Presence of automatic (implantable) cardiac defibrillator: Secondary | ICD-10-CM | POA: Diagnosis not present

## 2014-09-21 DIAGNOSIS — I2583 Coronary atherosclerosis due to lipid rich plaque: Principal | ICD-10-CM

## 2014-09-21 DIAGNOSIS — E1159 Type 2 diabetes mellitus with other circulatory complications: Secondary | ICD-10-CM | POA: Diagnosis not present

## 2014-09-21 LAB — PRO B NATRIURETIC PEPTIDE: Pro B Natriuretic peptide (BNP): 3006 pg/mL — ABNORMAL HIGH (ref <126)

## 2014-09-21 MED ORDER — FUROSEMIDE 10 MG/ML IJ SOLN: 40.0000 mg | Freq: Once | INTRAMUSCULAR | Status: DC

## 2014-09-21 MED ORDER — METOPROLOL TARTRATE 75 MG PO TABS: 75.0000 mg | ORAL_TABLET | Freq: Two times a day (BID) | ORAL | Status: DC

## 2014-09-21 MED ORDER — FUROSEMIDE 40 MG PO TABS: ORAL_TABLET | ORAL | Status: DC

## 2014-09-21 NOTE — Telephone Encounter (Signed)
Spoke with patient who confirmed he was taking his lasix 80 mg bid. Dr. Venetia Night was concerned he was not taking.  Patient stated he would go get his chest xray at the hospital today.  He mentioned that he would go to the ED to do it and I explained that he should not go to the ED for a chest xray.  I told him to go to the main entrance of the hospital and ask the front desk to direct him to xray.  Patient verbalized understanding.  Dr. Venetia Night would like patient to come to clinic after his chest xray today to get IV lasix.  Patient verbalized understanding and said he would be here before 3pm.

## 2014-09-21 NOTE — Patient Instructions (Signed)

## 2014-09-21 NOTE — Progress Notes (Signed)
Patient here because he was called to come in to receive IV Lasix. Patient reports he feels fine today. Patient is taking all of his medications. Patient BP  148/86.

## 2014-09-21 NOTE — Progress Notes (Signed)
Subjective:    Patient ID: Jacob [name], male    DOB: [birth year], 59 y.o.   MRN: [id number]  HPI   Jacob [name] is a 59 year old homeless male whom I [facility] the [facility] and subsequently for a follow visit yesterday; PMH is notable for a history of CAD status post CABG 2, severe ischemic cardiomyopathy with EF of 20-25% status post ICD, hypertension, hyperlipidemia, type 2 diabetes mellitus, atrial fibrillation on anticoagulation with Xarelto, PVD for follow-up visit. He has been on Lasix 80 mg twice a day (which was increased from  bid) due to elevated pro BNP of 1577 and reports compliance with it.  He was seen by cardiology on 09/13/14 and his Coreg was substituted with metoprolol, he had his device check and the plan was for repeat EKG in 2 weeks  He had complains of cough, pleuritic chest pain, dyspnea on mild exertion and a repeat pro BNP revealed an upward trend to 3006. I referred him for a CXR which revealed trace left pleural effusion, bilateral diffuse interstitial thickening, mild stable cardiomegaly findings most consistent with mild CHF.  He was called to come into the clinic to receive IV Lasix.  Current Outpatient Prescriptions on File Prior to Visit  Medication Sig Dispense Refill  . albuterol (PROVENTIL HFA;VENTOLIN HFA) 108 (90 BASE) MCG/ACT inhaler Inhale 2 puffs into the lungs every 6 (six) hours as needed for wheezing or shortness of breath. 1 Inhaler 0  . albuterol (PROVENTIL) (2.5 MG/3ML) 0.083% nebulizer solution Take 3 mLs (2.5 mg total) by nebulization every 6 (six) hours as needed for wheezing or shortness of breath. 150 mL 1  . glipiZIDE (GLUCOTROL) 5 MG tablet Take 1 tablet (5 mg total) by mouth 2 (two) times daily before a meal. 60 tablet 0  . nicotine (NICODERM CQ) 7 mg/24hr patch Place 1 patch (7 mg total) onto the skin daily. 28 patch 0  . rivaroxaban (XARELTO) 20 MG TABS tablet Take 1 tablet (20 mg total) by mouth daily before  supper. 30 tablet 6  . spironolactone (ALDACTONE) 25 MG tablet Take 1 tablet (25 mg total) by mouth daily. 30 tablet 6  . traMADol (ULTRAM) 50 MG tablet Take 1 tablet (50 mg total) by mouth every 8 (eight) hours as needed. 30 tablet 0   No current facility-administered medications on file prior to visit.    Past Surgical History  Procedure Laterality Date  . Implantable cardioverter defibrillator implant      [place] in 07/2012; [employer]  . Coronary artery bypass graft  09/2012    2 vessels per patient [name]   . Coronary angioplasty with stent placement  11/2011    "1"  . Cardiac catheterization  09/2012  . Iliac artery stent Right 08/30/2013  . Left and right heart catheterization with coronary angiogram N/A 12/02/2011    Procedure: LEFT AND RIGHT HEART CATHETERIZATION WITH CORONARY ANGIOGRAM;  Surgeon: Kathleene Hazel, MD;  Location: [facility] CATH LAB;  Service: Cardiovascular;  Laterality: N/A;  . Lower extremity angiogram N/A 08/30/2013    Procedure: LOWER EXTREMITY ANGIOGRAM;  Surgeon: Runell Gess, MD;  Location: [facility] CATH LAB;  Service: Cardiovascular;  Laterality: N/A;  . Lower extremity angiogram N/A 12/02/2013    Procedure: LOWER EXTREMITY ANGIOGRAM;  Surgeon: Runell Gess, MD;  Location: [facility] CATH LAB;  Service: Cardiovascular;  Laterality: N/A;    Family History  Problem Relation Age of Onset  . Diabetes Mother  History   Social History  . Marital Status: Divorced    Spouse Name: N/A  . Number of Children: 3  . Years of Education: N/A   Occupational History  . Unemployed    Social History Main Topics  . Smoking status: Current Every Day Smoker -- 0.75 packs/day for 38 years    Types: Cigarettes  . Smokeless tobacco: Never Used  . Alcohol Use: No  . Drug Use: No  . Sexual Activity: Yes   Other Topics Concern  . Not on file   Social History Narrative   Has an apartment with a roommate. He was living on the streets in 25-Jan-2013.  He reports  that his father died in [place] in January 25, 2013.  He is divorced.  He is no longer estranged from his son, but still from his daughter who lives locally.  Neither of his parents, nor any siblings have any history of CAD.   No Known Allergies  Current Outpatient Prescriptions on File Prior to Visit  Medication Sig Dispense Refill  . albuterol (PROVENTIL HFA;VENTOLIN HFA) 108 (90 BASE) MCG/ACT inhaler Inhale 2 puffs into the lungs every 6 (six) hours as needed for wheezing or shortness of breath. 1 Inhaler 0  . albuterol (PROVENTIL) (2.5 MG/3ML) 0.083% nebulizer solution Take 3 mLs (2.5 mg total) by nebulization every 6 (six) hours as needed for wheezing or shortness of breath. 150 mL 1  . glipiZIDE (GLUCOTROL) 5 MG tablet Take 1 tablet (5 mg total) by mouth 2 (two) times daily before a meal. 60 tablet 0  . nicotine (NICODERM CQ) 7 mg/24hr patch Place 1 patch (7 mg total) onto the skin daily. 28 patch 0  . rivaroxaban (XARELTO) 20 MG TABS tablet Take 1 tablet (20 mg total) by mouth daily before supper. 30 tablet 6  . spironolactone (ALDACTONE) 25 MG tablet Take 1 tablet (25 mg total) by mouth daily. 30 tablet 6  . traMADol (ULTRAM) 50 MG tablet Take 1 tablet (50 mg total) by mouth every 8 (eight) hours as needed. 30 tablet 0   No current facility-administered medications on file prior to visit.       Review of Systems  Constitutional: Negative for activity change and appetite change.  HENT: Negative for sinus pressure and sore throat.   Eyes: Negative for visual disturbance.  Respiratory: Positive for cough and shortness of breath. Negative for chest tightness.   Cardiovascular: Positive for leg swelling. Negative for chest pain and palpitations.  Gastrointestinal: Negative for abdominal pain and abdominal distention.  Endocrine: Negative for cold intolerance, heat intolerance and polyphagia.  Genitourinary: Negative for dysuria, frequency and difficulty urinating.  Musculoskeletal:  Negative for back pain, joint swelling and arthralgias.  Skin: Negative for color change.       Objective: Filed Vitals:   09/21/14 1519  BP: 148/86  Pulse: 148  Temp: 97.6 F (36.4 C)  TempSrc: Oral  Resp: 18  SpO2: 97%      Physical Exam  Constitutional: He is oriented to person, place, and time. He appears well-developed and well-nourished.  Neck: Normal range of motion. Neck supple. JVD present. No tracheal deviation present.  Cardiovascular: Normal heart sounds.  An irregularly irregular rhythm present. Tachycardia present.   No murmur heard. Pulmonary/Chest: Effort normal and breath sounds normal. No respiratory distress. He has no wheezes. He exhibits no tenderness.  Abdominal: Soft. Bowel sounds are normal. He exhibits no mass. There is no tenderness.  Musculoskeletal: Normal range of motion. He exhibits  edema (1+bilateral pitting pedal edema). He exhibits no tenderness.  Neurological: He is alert and oriented to person, place, and time.  Psychiatric: He has a normal mood and affect.    BNP (last 3 results)  Recent Labs  08/01/14 1453 08/18/14 1300  BNP 960.6* 670.1*    ProBNP (last 3 results)  Recent Labs  08/31/14 1126 09/06/14 1107 09/20/14 1201  PROBNP CANCELED 1577.00* 3006.00*      CLINICAL DATA: Cough and dyspnea. Left-sided chest pain.  EXAM: CHEST 2 VIEW  COMPARISON: 08/18/2014  FINDINGS: Small right pleural effusion. Trace left pleural effusion. Bilateral diffuse interstitial thickening. No pneumothorax. Mild stable cardiomegaly. Single lead cardiac pacer. Prior CABG. No acute osseous abnormality.  IMPRESSION: Findings most consistent with mild CHF.   Electronically Signed  By: Elige Ko  On: 09/21/2014 12:45      Assessment & Plan:  59 year old homeless male with a history of CAD status post CABG 2, severe ischemic cardiomyopathy with EF of 20-25% status post ICD, hypertension, hyperlipidemia, type 2 diabetes  mellitus, atrial fibrillation on anticoagulation with Xarelto, PVD with symptoms of CHF exacerbation.  Acute on chronic CHF: EF of 20-25% Gained 5 lbs in last 2 weeks IV Lasix 40 mg given and patient tolerated it well. Patient discussed with Dr. wall and will place him on his schedule for next week for follow-up. Meanwhile increase Lasix from 80 mg twice daily to 120 mg in the morning and 80 mg at bedtime. He has been given a prescription for scale and is to weigh daily, limit fluids to 2 L per day, low-sodium diet.  Hypertension: Controlled on metipranolol which I have increased from 50 mg twice daily to 75 mg twice daily for better rate control.   Type 2 diabetes mellitus: Uncontrolled with A1c of 8.4. Due to his current Ramaddan fast I will hold off on making progressive changes to his diabetic medications to prevent hypoglycemia. He is also high risk patient and so his glycemic control will be less strict  Atrial fibrillation: Continue anticoagulation with Xarelto, rate control with metoprolol.  Coronary artery disease: Status post stent.  This note has been created with Education officer, environmental. Any transcriptional errors are unintentional.

## 2014-09-21 NOTE — Progress Notes (Signed)
Quick Note:  Patient called my nurse to come in today to receive IV Lasix for CHF exacerbation. and he has also been advised to have his chest x-ray done prior to coming in and he promises to comply. ______

## 2014-09-28 ENCOUNTER — Encounter (HOSPITAL_COMMUNITY): Payer: Self-pay | Admitting: Emergency Medicine

## 2014-09-28 ENCOUNTER — Telehealth: Payer: Self-pay | Admitting: Internal Medicine

## 2014-09-28 ENCOUNTER — Telehealth: Payer: Self-pay

## 2014-09-28 ENCOUNTER — Ambulatory Visit: Payer: Medicaid Other | Admitting: Cardiology

## 2014-09-28 ENCOUNTER — Emergency Department (HOSPITAL_COMMUNITY)
Admission: EM | Admit: 2014-09-28 | Discharge: 2014-09-28 | Discharge status: Against Medical Advice | Payer: Medicaid Other | Attending: Emergency Medicine | Admitting: Emergency Medicine

## 2014-09-28 ENCOUNTER — Emergency Department (HOSPITAL_COMMUNITY): Payer: Medicaid Other

## 2014-09-28 DIAGNOSIS — Z79899 Other long term (current) drug therapy: Secondary | ICD-10-CM | POA: Diagnosis not present

## 2014-09-28 DIAGNOSIS — Z9861 Coronary angioplasty status: Secondary | ICD-10-CM | POA: Insufficient documentation

## 2014-09-28 DIAGNOSIS — I4891 Unspecified atrial fibrillation: Secondary | ICD-10-CM | POA: Diagnosis not present

## 2014-09-28 DIAGNOSIS — R0602 Shortness of breath: Secondary | ICD-10-CM | POA: Insufficient documentation

## 2014-09-28 DIAGNOSIS — R079 Chest pain, unspecified: Secondary | ICD-10-CM | POA: Diagnosis not present

## 2014-09-28 DIAGNOSIS — Z9889 Other specified postprocedural states: Secondary | ICD-10-CM | POA: Diagnosis not present

## 2014-09-28 DIAGNOSIS — R2240 Localized swelling, mass and lump, unspecified lower limb: Secondary | ICD-10-CM | POA: Insufficient documentation

## 2014-09-28 DIAGNOSIS — I5021 Acute systolic (congestive) heart failure: Secondary | ICD-10-CM | POA: Diagnosis not present

## 2014-09-28 DIAGNOSIS — Z951 Presence of aortocoronary bypass graft: Secondary | ICD-10-CM | POA: Insufficient documentation

## 2014-09-28 DIAGNOSIS — I10 Essential (primary) hypertension: Secondary | ICD-10-CM | POA: Insufficient documentation

## 2014-09-28 DIAGNOSIS — Z86718 Personal history of other venous thrombosis and embolism: Secondary | ICD-10-CM | POA: Insufficient documentation

## 2014-09-28 DIAGNOSIS — R011 Cardiac murmur, unspecified: Secondary | ICD-10-CM | POA: Diagnosis not present

## 2014-09-28 DIAGNOSIS — I252 Old myocardial infarction: Secondary | ICD-10-CM | POA: Diagnosis not present

## 2014-09-28 DIAGNOSIS — E119 Type 2 diabetes mellitus without complications: Secondary | ICD-10-CM | POA: Diagnosis not present

## 2014-09-28 DIAGNOSIS — I482 Chronic atrial fibrillation, unspecified: Secondary | ICD-10-CM | POA: Diagnosis present

## 2014-09-28 DIAGNOSIS — Z7901 Long term (current) use of anticoagulants: Secondary | ICD-10-CM | POA: Insufficient documentation

## 2014-09-28 DIAGNOSIS — I2510 Atherosclerotic heart disease of native coronary artery without angina pectoris: Secondary | ICD-10-CM | POA: Insufficient documentation

## 2014-09-28 DIAGNOSIS — Z720 Tobacco use: Secondary | ICD-10-CM | POA: Insufficient documentation

## 2014-09-28 DIAGNOSIS — Z95810 Presence of automatic (implantable) cardiac defibrillator: Secondary | ICD-10-CM | POA: Diagnosis not present

## 2014-09-28 LAB — CBC WITH DIFFERENTIAL/PLATELET
BASOS PCT: 1 % (ref 0–1)
Basophils Absolute: 0.1 10*3/uL (ref 0.0–0.1)
EOS PCT: 1 % (ref 0–5)
Eosinophils Absolute: 0.1 10*3/uL (ref 0.0–0.7)
HCT: 37.6 % — ABNORMAL LOW (ref 39.0–52.0)
HEMOGLOBIN: 10.6 g/dL — AB (ref 13.0–17.0)
LYMPHS ABS: 1.5 10*3/uL (ref 0.7–4.0)
Lymphocytes Relative: 17 % (ref 12–46)
MCH: 19.8 pg — AB (ref 26.0–34.0)
MCHC: 28.2 g/dL — AB (ref 30.0–36.0)
MCV: 70.3 fL — AB (ref 78.0–100.0)
Monocytes Absolute: 0.7 10*3/uL (ref 0.1–1.0)
Monocytes Relative: 8 % (ref 3–12)
NEUTROS PCT: 73 % (ref 43–77)
Neutro Abs: 6.5 10*3/uL (ref 1.7–7.7)
Platelets: 218 10*3/uL (ref 150–400)
RBC: 5.35 MIL/uL (ref 4.22–5.81)
RDW: 20.7 % — ABNORMAL HIGH (ref 11.5–15.5)
WBC: 8.9 10*3/uL (ref 4.0–10.5)

## 2014-09-28 LAB — BASIC METABOLIC PANEL
ANION GAP: 9 (ref 5–15)
BUN: 22 mg/dL — ABNORMAL HIGH (ref 6–20)
CHLORIDE: 99 mmol/L — AB (ref 101–111)
CO2: 26 mmol/L (ref 22–32)
Calcium: 8.6 mg/dL — ABNORMAL LOW (ref 8.9–10.3)
Creatinine, Ser: 1.01 mg/dL (ref 0.61–1.24)
GFR calc non Af Amer: >60 mL/min (ref >60)
Glucose, Bld: 156 mg/dL — ABNORMAL HIGH (ref 65–99)
Potassium: 4.3 mmol/L (ref 3.5–5.1)
SODIUM: 134 mmol/L — AB (ref 135–145)

## 2014-09-28 LAB — BRAIN NATRIURETIC PEPTIDE: B Natriuretic Peptide: 1081.2 pg/mL — ABNORMAL HIGH (ref 0.0–100.0)

## 2014-09-28 LAB — PROTIME-INR
INR: 1.49 (ref 0.00–1.49)
Prothrombin Time: 18.1 seconds — ABNORMAL HIGH (ref 11.6–15.2)

## 2014-09-28 LAB — APTT: aPTT: 33 seconds (ref 24–37)

## 2014-09-28 LAB — TROPONIN I: Troponin I: 0.05 ng/mL — ABNORMAL HIGH (ref <0.031)

## 2014-09-28 MED ORDER — METOPROLOL TARTRATE 1 MG/ML IV SOLN
5.0000 mg | Freq: Once | INTRAVENOUS | Status: AC
  Administered 2014-09-28: 5 mg via INTRAVENOUS
  Filled 2014-09-28: qty 5

## 2014-09-28 MED ORDER — METOPROLOL TARTRATE 25 MG PO TABS
25.0000 mg | ORAL_TABLET | Freq: Once | ORAL | Status: AC
  Administered 2014-09-28: 25 mg via ORAL
  Filled 2014-09-28: qty 1

## 2014-09-28 MED ORDER — FUROSEMIDE 10 MG/ML IJ SOLN
80.0000 mg | Freq: Once | INTRAMUSCULAR | Status: AC
Start: 2014-09-28 — End: 2014-09-28
  Administered 2014-09-28: 80 mg via INTRAVENOUS
  Filled 2014-09-28: qty 8

## 2014-09-28 NOTE — ED Notes (Signed)
Pt is impatient. Pt asks for something to eat, and this RN replied that she needed to talk with the doctor. Pt then stated that he was going to leave if he couldn't get something to eat.

## 2014-09-28 NOTE — ED Notes (Signed)
Pt here from home with c/o sob , chest pain last night , pt was found to be in A fib rate of 160 , pt received 20mg   cardizem  IVP , pt came down to 80 to 120's

## 2014-09-28 NOTE — ED Notes (Signed)
Pt has stated multiple times that he does not want to stay. Pt states that he is tired, and that it is getting too late. This RN spoke with Rhunette Croft, MD who states that he will speak with the pt. Pt informed.  Pt has taken off his blood pressure cuff.

## 2014-09-28 NOTE — ED Notes (Signed)
Pt also received 2 nitro by  EMS

## 2014-09-28 NOTE — Telephone Encounter (Signed)
Call placed to patient to follow-up as he did not come to his appointment today with Dr. Daleen Squibb. Inquired if patient would like to reschedule appointment. Patient indicates he has been taking his medication as prescribed but has been extremely short of breath x 2 days.  Patient indicates he is taking lasix but still experiencing shortness of breath.  Informed patient that if he is still experiencing shortness of breath after using inhaler, nebulizer as prescribed he should go to ED. Patient verbalized understanding and indicates he has been taking meds as prescribed and will call EMS.

## 2014-09-28 NOTE — H&P (Signed)
Jacob [name] is an 59 y.o. male.     Chief Complaint: dyspnea HPI: 60 yo male with hypertension, Pafib, CAD, CHF (EF 40-45%), apparently c/o dyspnea.  Pt decided he was not going to stay and left AMA  Past Medical History  Diagnosis Date  . Hypertension   . Systolic heart failure     EF is 40-45% by echo, December 2013  . Noncompliance   . CAD (coronary artery disease) Sept 2013    s/p cardiac cath showing occlusion of small RCA with collaterals  . Chronic anticoagulation     on coumadin  . Atrial fibrillation   . Peripheral arterial disease   . Automatic implantable cardioverter-defibrillator in situ   . High cholesterol   . Myocardial infarction 2014  . Type II diabetes mellitus     Past Surgical History  Procedure Laterality Date  . Implantable cardioverter defibrillator implant      [place] in 07/2012; [employer]  . Coronary artery bypass graft  09/2012    2 vessels per patient [name])   . Coronary angioplasty with stent placement  11/2011    "1"  . Cardiac catheterization  09/2012  . Iliac artery stent Right 08/30/2013  . Left and right heart catheterization with coronary angiogram N/A 12/02/2011    Procedure: LEFT AND RIGHT HEART CATHETERIZATION WITH CORONARY ANGIOGRAM;  Surgeon: Burnell Blanks, MD;  Location: [facility] CATH LAB;  Service: Cardiovascular;  Laterality: N/A;  . Lower extremity angiogram N/A 08/30/2013    Procedure: LOWER EXTREMITY ANGIOGRAM;  Surgeon: Lorretta Harp, MD;  Location: [facility] CATH LAB;  Service: Cardiovascular;  Laterality: N/A;  . Lower extremity angiogram N/A 12/02/2013    Procedure: LOWER EXTREMITY ANGIOGRAM;  Surgeon: Lorretta Harp, MD;  Location: [facility] CATH LAB;  Service: Cardiovascular;  Laterality: N/A;    Family History  Problem Relation Age of Onset  . Diabetes Mother    Social History:  reports that he has been smoking Cigarettes.  He has a 28.5 pack-year smoking history. He has never used smokeless tobacco. He reports that he does  not drink alcohol. His drug history is not on file.  Allergies: No Known Allergies   (Not in a hospital admission)  Results for orders placed or performed during the hospital encounter of 09/28/14 (from the past 48 hour(s))  CBC with Differential     Status: Abnormal   Collection Time: 09/28/14  7:35 PM  Result Value Ref Range   WBC 8.9 4.0 - 10.5 K/uL   RBC 5.35 4.22 - 5.81 MIL/uL   Hemoglobin 10.6 (L) 13.0 - 17.0 g/dL   HCT 37.6 (L) 39.0 - 52.0 %   MCV 70.3 (L) 78.0 - 100.0 fL   MCH 19.8 (L) 26.0 - 34.0 pg   MCHC 28.2 (L) 30.0 - 36.0 g/dL   RDW 20.7 (H) 11.5 - 15.5 %   Platelets 218 150 - 400 K/uL    Comment: PLATELET COUNT CONFIRMED BY SMEAR   Neutrophils Relative % 73 43 - 77 %   Lymphocytes Relative 17 12 - 46 %   Monocytes Relative 8 3 - 12 %   Eosinophils Relative 1 0 - 5 %   Basophils Relative 1 0 - 1 %   Neutro Abs 6.5 1.7 - 7.7 K/uL   Lymphs Abs 1.5 0.7 - 4.0 K/uL   Monocytes Absolute 0.7 0.1 - 1.0 K/uL   Eosinophils Absolute 0.1 0.0 - 0.7 K/uL   Basophils Absolute 0.1 0.0 - 0.1 K/uL  RBC Morphology POLYCHROMASIA PRESENT     Comment: ELLIPTOCYTES  Basic metabolic panel     Status: Abnormal   Collection Time: 09/28/14  7:35 PM  Result Value Ref Range   Sodium 134 (L) 135 - 145 mmol/L   Potassium 4.3 3.5 - 5.1 mmol/L   Chloride 99 (L) 101 - 111 mmol/L   CO2 26 22 - 32 mmol/L   Glucose, Bld 156 (H) 65 - 99 mg/dL   BUN 22 (H) 6 - 20 mg/dL   Creatinine, Ser 1.01 0.61 - 1.24 mg/dL   Calcium 8.6 (L) 8.9 - 10.3 mg/dL   GFR calc non Af Amer >60 >60 mL/min   GFR calc Af Amer >60 >60 mL/min    Comment: (NOTE) The eGFR has been calculated using the CKD EPI equation. This calculation has not been validated in all clinical situations. eGFR's persistently <60 mL/min signify possible Chronic Kidney Disease.    Anion gap 9 5 - 15  Brain natriuretic peptide     Status: Abnormal   Collection Time: 09/28/14  7:35 PM  Result Value Ref Range   B Natriuretic Peptide 1081.2  (H) 0.0 - 100.0 pg/mL  Troponin I     Status: Abnormal   Collection Time: 09/28/14  7:35 PM  Result Value Ref Range   Troponin I 0.05 (H) <0.031 ng/mL    Comment:        PERSISTENTLY INCREASED TROPONIN VALUES IN THE RANGE OF 0.04-0.49 ng/mL CAN BE SEEN IN:       -UNSTABLE ANGINA       -CONGESTIVE HEART FAILURE       -MYOCARDITIS       -CHEST TRAUMA       -ARRYHTHMIAS       -LATE PRESENTING MYOCARDIAL INFARCTION       -COPD   CLINICAL FOLLOW-UP RECOMMENDED.   APTT     Status: None   Collection Time: 09/28/14  7:35 PM  Result Value Ref Range   aPTT 33 24 - 37 seconds  Protime-INR     Status: Abnormal   Collection Time: 09/28/14  7:35 PM  Result Value Ref Range   Prothrombin Time 18.1 (H) 11.6 - 15.2 seconds   INR 1.49 0.00 - 1.49   Dg Chest 2 View  09/28/2014   CLINICAL DATA:  Acute onset of shortness of breath and generalized chest pain. Tachycardia. Initial encounter.  EXAM: CHEST  2 VIEW  COMPARISON:  Chest radiograph performed 09/21/2014  FINDINGS: The lungs are well-aerated. A small right pleural effusion is noted. Bilateral airspace opacification likely reflects pulmonary edema. Underlying vascular congestion is seen. No pneumothorax is identified.  The heart is mildly enlarged. The patient is status post median sternotomy. An AICD is noted at the left chest wall, with a single lead ending at the right ventricle. No acute osseous abnormalities are seen.  IMPRESSION: Small right pleural effusion noted. Bilateral airspace opacification likely reflects pulmonary edema. Underlying vascular congestion and mild cardiomegaly noted.   Electronically Signed   By: Garald Balding M.D.   On: 09/28/2014 20:09    Review of Systems  Unable to perform ROS: other    Blood pressure 125/98, pulse 115, temperature 99.1 F (37.3 C), temperature source Oral, resp. rate 23, SpO2 96 %. Physical Exam   Assessment/Plan Afib with rvr Tele Trop i q6h x3 tsh Check echo Metoprolol  CHF Lasix iv   Cont spironolactone Cont metoprolol Add lisinopril  Dm2 fsbs ac and qhs, iss  DVT prophylaxis:  Scd,   Pt decided to leave AMA.  [name] 09/28/2014, 10:31 PM

## 2014-09-28 NOTE — Telephone Encounter (Signed)
Called patient. Left a message on the voicemail for patient to return call at [phone].

## 2014-09-28 NOTE — ED Provider Notes (Signed)
CSN: [id number]     Arrival date & time 09/28/14  1743 History   First MD Initiated Contact with Patient 09/28/14 1749     Chief Complaint  Patient presents with  . Atrial Fibrillation     (Consider location/radiation/quality/duration/timing/severity/associated sxs/prior Treatment) HPI Comments: 59 y/o man with a history of MI, atrial fibrillation on anticoagulation, and CHF (EF 15%) on furosemide and metoprolol who presents with shortness of breath, chest pain, and a fib. He reports that the chest pain began yesterday evening while resting. He reports partial relief of pain with a single dose of tramadol yesterday, but continued to have chest pain and shortness of breath today. He describes the pain as left sided, and denies nausea, vomiting, radiating chest pain, and denies worsening of symptoms with exertion. He reports worsening lower extremity edema. He reports not missing any doses of his medications recently. Shortness of breath is worse with laying flat.  Pt was seen by Cards last month, and they changed his rate control meds and uptitrated his lasix.    ROS 10 Systems reviewed and are negative for acute change except as noted in the HPI.     The history is provided by the patient and medical records.    Past Medical History  Diagnosis Date  . Hypertension   . Systolic heart failure     EF is 40-45% by echo, December 2013  . Noncompliance   . CAD (coronary artery disease) Sept 2013    s/p cardiac cath showing occlusion of small RCA with collaterals  . Chronic anticoagulation     on coumadin  . Atrial fibrillation   . Peripheral arterial disease   . Automatic implantable cardioverter-defibrillator in situ   . High cholesterol   . Myocardial infarction 2014  . Type II diabetes mellitus    Past Surgical History  Procedure Laterality Date  . Implantable cardioverter defibrillator implant      [place] in 07/2012; [employer]  . Coronary artery bypass graft  09/2012     2 vessels per patient [name]   . Coronary angioplasty with stent placement  11/2011    "1"  . Cardiac catheterization  09/2012  . Iliac artery stent Right 08/30/2013  . Left and right heart catheterization with coronary angiogram N/A 12/02/2011    Procedure: LEFT AND RIGHT HEART CATHETERIZATION WITH CORONARY ANGIOGRAM;  Surgeon: Kathleene Hazel, MD;  Location: [facility] CATH LAB;  Service: Cardiovascular;  Laterality: N/A;  . Lower extremity angiogram N/A 08/30/2013    Procedure: LOWER EXTREMITY ANGIOGRAM;  Surgeon: Runell Gess, MD;  Location: [facility] CATH LAB;  Service: Cardiovascular;  Laterality: N/A;  . Lower extremity angiogram N/A 12/02/2013    Procedure: LOWER EXTREMITY ANGIOGRAM;  Surgeon: Runell Gess, MD;  Location: [facility] CATH LAB;  Service: Cardiovascular;  Laterality: N/A;   Family History  Problem Relation Age of Onset  . Diabetes Mother    History  Substance Use Topics  . Smoking status: Current Every Day Smoker -- 0.75 packs/day for 38 years    Types: Cigarettes  . Smokeless tobacco: Never Used  . Alcohol Use: No    Review of Systems  All other systems reviewed and are negative.     Allergies  Review of patient's allergies indicates no known allergies.  Home Medications   Prior to Admission medications   Medication Sig Start Date End Date Taking? Authorizing Provider  albuterol (PROVENTIL HFA;VENTOLIN HFA) 108 (90 BASE) MCG/ACT inhaler Inhale 2 puffs into the lungs every  6 (six) hours as needed for wheezing or shortness of breath. 09/20/14  Yes Jaclyn Shaggy, MD  albuterol (PROVENTIL) (2.5 MG/3ML) 0.083% nebulizer solution Take 3 mLs (2.5 mg total) by nebulization every 6 (six) hours as needed for wheezing or shortness of breath. 09/06/14  Yes Jaclyn Shaggy, MD  furosemide (LASIX) 80 MG tablet Take 80 mg by mouth 2 (two) times daily.   Yes Historical Provider, MD  glipiZIDE (GLUCOTROL) 5 MG tablet Take 1 tablet (5 mg total) by mouth 2 (two) times daily before a meal.  04/12/14  Yes Olugbemiga E Hyman Hopes, MD  metoprolol 75 MG TABS Take 75 mg by mouth 2 (two) times daily. 09/21/14  Yes Jaclyn Shaggy, MD  nicotine (NICODERM CQ) 7 mg/24hr patch Place 1 patch (7 mg total) onto the skin daily. 08/29/14  Yes Jaclyn Shaggy, MD  rivaroxaban (XARELTO) 20 MG TABS tablet Take 1 tablet (20 mg total) by mouth daily before supper. 08/20/14  Yes Leone Brand, NP  spironolactone (ALDACTONE) 25 MG tablet Take 1 tablet (25 mg total) by mouth daily. 04/12/14  Yes Olugbemiga Annitta Needs, MD  traMADol (ULTRAM) 50 MG tablet Take 1 tablet (50 mg total) by mouth every 8 (eight) hours as needed. Patient taking differently: Take 50 mg by mouth every 8 (eight) hours as needed for moderate pain.  09/20/14  Yes Jaclyn Shaggy, MD  furosemide (LASIX) 40 MG tablet 3 tabs ( ) in the morning and 2 tabs ( ) in the evening Patient not taking: Reported on 09/28/2014 09/21/14   Jaclyn Shaggy, MD   BP 125/98 mmHg  Pulse 115  Temp(Src) 99.1 F (37.3 C) (Oral)  Resp 23  SpO2 96% Physical Exam  Constitutional: He is oriented to person, place, and time. He appears well-developed.  HENT:  Head: Normocephalic and atraumatic.  Eyes: Conjunctivae and EOM are normal. Pupils are equal, round, and reactive to light.  Neck: Normal range of motion. Neck supple.  Cardiovascular:  Murmur heard. Pulmonary/Chest: Effort normal and breath sounds normal.  Abdominal: Soft. Bowel sounds are normal. He exhibits no distension. There is no tenderness. There is no rebound and no guarding.  Musculoskeletal: He exhibits edema.  2+ edema  Neurological: He is alert and oriented to person, place, and time.  Skin: Skin is warm.  Nursing note and vitals reviewed.   ED Course  Procedures (including critical care time) Labs Review Labs Reviewed  CBC WITH DIFFERENTIAL/PLATELET - Abnormal; Notable for the following:    Hemoglobin 10.6 (*)    HCT 37.6 (*)    MCV 70.3 (*)    MCH 19.8 (*)    MCHC 28.2 (*)    RDW 20.7 (*)     All other components within normal limits  BASIC METABOLIC PANEL - Abnormal; Notable for the following:    Sodium 134 (*)    Chloride 99 (*)    Glucose, Bld 156 (*)    BUN 22 (*)    Calcium 8.6 (*)    All other components within normal limits  BRAIN NATRIURETIC PEPTIDE - Abnormal; Notable for the following:    B Natriuretic Peptide 1081.2 (*)    All other components within normal limits  TROPONIN I - Abnormal; Notable for the following:    Troponin I 0.05 (*)    All other components within normal limits  PROTIME-INR - Abnormal; Notable for the following:    Prothrombin Time 18.1 (*)    All other components within normal limits  APTT    Imaging Review  Dg Chest 2 View  09/28/2014   CLINICAL DATA:  Acute onset of shortness of breath and generalized chest pain. Tachycardia. Initial encounter.  EXAM: CHEST  2 VIEW  COMPARISON:  Chest radiograph performed 09/21/2014  FINDINGS: The lungs are well-aerated. A small right pleural effusion is noted. Bilateral airspace opacification likely reflects pulmonary edema. Underlying vascular congestion is seen. No pneumothorax is identified.  The heart is mildly enlarged. The patient is status post median sternotomy. An AICD is noted at the left chest wall, with a single lead ending at the right ventricle. No acute osseous abnormalities are seen.  IMPRESSION: Small right pleural effusion noted. Bilateral airspace opacification likely reflects pulmonary edema. Underlying vascular congestion and mild cardiomegaly noted.   Electronically Signed   By: Roanna Raider M.D.   On: 09/28/2014 20:09     EKG Interpretation   Date/Time:  Wednesday September 28 2014 17:50:55 EDT Ventricular Rate:  121 PR Interval:    QRS Duration: 102 QT Interval:  346 QTC Calculation: 491 R Axis:   -71 Text Interpretation:  Atrial flutter with predominant 2:1 AV block Left  anterior fascicular block Nonspecific T abnormalities, lateral leads  Borderline prolonged QT interval new t  wave inversions Confirmed by  Chelcy Bolda, MD, Yannet Rincon ([id number]) on 09/28/2014 6:06:44 PM       LAST ANGIOGRAM PER RECORDS:  Coronary angiography: Coronary dominance: Left  Left mainstem: Mild irregularities up to 20%.  Left anterior descending (LAD): The LAD is a large vessel that extends around the apex. It has diffuse irregularities throughout up to 20-30% in the proximal and mid vessel. The distal vessel has 40% disease. The first and second diagonal branches are small in caliber. They each have 60-70% disease proximally.  Left circumflex (LCx): The left circumflex coronary is a large dominant vessel. It has 30-40% narrowing at the ostium. The first marginal branch has 30% disease proximally. There is 30% disease prior to the PDA.  Right coronary artery (RCA): The right coronary is a small nondominant vessel. 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 There is left to right collaterals to the distal vessel.  Left ventriculography: Not performed.  Final Conclusions:  1. 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 Patient does have diffuse nonobstructive disease. 2. Moderate pulmonary hypertension with elevated left ventricular filling pressures.   Recommendations: Continue aggressive medical management of his congestive heart failure. We will add statin therapy. We'll resume anticoagulation with heparin and then transition to Coumadin orally for his atrial fibrillation.   Theron Arista [facility] 12/02/2011  MDM   Final diagnoses:  Acute systolic congestive heart failure  Atrial fibrillation with RVR    Pt comes in with cc of chest pain and dib. Pt has hx of afib, DVT, CAD, CHF and is on anticoagulants. Exam consistent with some fluid overload. EKG shows afib/flutter.  Differential diagnosis includes: ACS syndrome CHF exacerbation Valvular disorder Myocarditis Pericarditis Pericardial effusion Pneumonia Pleural effusion Pulmonary edema PE Musculoskeletal  pain  Clinical concerns high for ACS, CHF exacerbation. Labs ordered. Also is noted to be in afib with RVR, HR in the 120s and 130s.  ? Non compliance - but pt states he has been taking his meds. There was uptitation of his lasix.  Medicine to admit, Cards consulted for management of afib with rvr and acute chf. 5 mg iv metoprolol given in the Er with 80 mg ic lasix.   11:19 PM Pt left AMA. Seems like he just eloped - he was aware about needing admission and  optimization. Cards and medicine had already been consulted. His HR had improved to 90s with iv metop and oral metoprolol has been ordered.   Derwood Kaplan, MD 09/28/14 2320

## 2014-09-28 NOTE — ED Notes (Signed)
Pt called out asking to see the nurse. This RN went to room and pt told her that he wanted to leave, and that I couldn't say anything to change his mind. This RN attempted to explain to the pt the risks of leaving, but the pt talked over this RN and wouldn't listen. This RN attempted to tell the pt that he needed to stay because some of his labs were elevated, but the pt again talked over her, saying "don't worry about me, I don't want to hear it".

## 2014-09-28 NOTE — ED Notes (Signed)
Bed placement notified that the pt is no longer here.

## 2014-09-28 NOTE — ED Notes (Signed)
Pt refuses to wear his blood pressure cuff.

## 2014-09-28 NOTE — ED Notes (Signed)
Pt undressed, in gown, on monitor, continuous pulse oximetry and blood pressure cuff 

## 2014-09-29 ENCOUNTER — Telehealth: Payer: Self-pay

## 2014-09-29 ENCOUNTER — Ambulatory Visit: Payer: Medicaid Other | Admitting: Internal Medicine

## 2014-09-29 ENCOUNTER — Encounter: Payer: Self-pay | Admitting: Internal Medicine

## 2014-09-29 NOTE — Telephone Encounter (Signed)
Spoke with Dr. Hyman Hopes about patient. Patient left AMA yesterday from [facility].  Patient was to be admitted for management of atrial fibrillation with RVR and acute congestive heart failure. Dr. Hyman Hopes indicated patient should be called and brought into clinic for an appointment with him today.  Patient called and informed he should be seen by Dr. Hyman Hopes today at [facility] as he was to be admitted yesterday for management of atrial fibrillation with RVR and acute congestive heart failure. Patient agreeable.  Appointment scheduled for 09/29/14 at 1445.

## 2014-09-29 NOTE — Telephone Encounter (Signed)
Spoke to Dr. Hyman Hopes who indicates patient did not show up for appointment today at 1445. He indicates he could see patient 09/30/14 at 0900. Call placed to patient, and he indicated he could not make appointment but would be able to make appointment 09/30/14 at 0900. Scheduler informed and Dr. Hyman Hopes updated as well. Patient reminded to take all medications as prescribed and to bring all medications into appointment tomorrow. Patient informed to go to ED if he experiences chest pain or shortness of breath unrelieved with medications (inhaler, nebulizer) like he did on 09/28/14. Patient verbalized understanding and indicates he will be at appointment on 09/30/14 at 0900.

## 2014-09-30 ENCOUNTER — Telehealth: Payer: Self-pay

## 2014-09-30 ENCOUNTER — Other Ambulatory Visit: Payer: Self-pay

## 2014-09-30 ENCOUNTER — Ambulatory Visit: Payer: Medicaid Other | Admitting: Internal Medicine

## 2014-09-30 DIAGNOSIS — R0600 Dyspnea, unspecified: Secondary | ICD-10-CM

## 2014-09-30 MED ORDER — ALBUTEROL SULFATE HFA 108 (90 BASE) MCG/ACT IN AERS: 2.0000 | INHALATION_SPRAY | Freq: Four times a day (QID) | RESPIRATORY_TRACT | Status: DC | PRN

## 2014-09-30 NOTE — Telephone Encounter (Signed)
Received call back from patient.  Informed patient of appointment at 0900; patient indicates he just woke up and cannot be at clinic until 1000.  Spoke with Dr. Hyman Hopes who indicated he could not see him at that time but would be able to see him 10/03/14 at 0900. Spoke with patient who indicates he prefers to keep his appointment with Dr. Venetia Night on 10/04/14 at 1215.  Inquired about patient's medical condition; he indicates he is "breathing good" and denies chest pain at this time. Patient aware to go to ED if condition changes. Went over patient's medications, and patient indicates he is taking all medications as prescribed but is getting low on his albuterol inhaler.  Spoke with Dr. Hyman Hopes as medication written by Dr. Venetia Night on 09/20/14. He indicates medication could be refilled- for 1 inhaler.  Verbal order entered and sent to [facility] pharmacy. Patient updated. No other needs identified at this time.

## 2014-09-30 NOTE — Telephone Encounter (Signed)
Call placed to patient to remind him of appointment today at 0900 with Dr. Hyman Hopes. Unable to reach patient; left voicemail requesting return call.

## 2014-10-03 ENCOUNTER — Telehealth: Payer: Self-pay

## 2014-10-03 NOTE — Telephone Encounter (Signed)
Called the patient # [phone] to check on his status and confirm his appointment for tomorrow. Voice mail message left requesting a return call to # [phone] or [phone]

## 2014-10-04 ENCOUNTER — Ambulatory Visit: Payer: Medicaid Other | Attending: Family Medicine | Admitting: Family Medicine

## 2014-10-04 ENCOUNTER — Encounter: Payer: Self-pay | Admitting: Family Medicine

## 2014-10-04 VITALS — BP 117/82 | HR 135 | Temp 97.4°F | Resp 20 | Ht 64.0 in | Wt 232.0 lb

## 2014-10-04 DIAGNOSIS — I5023 Acute on chronic systolic (congestive) heart failure: Secondary | ICD-10-CM

## 2014-10-04 DIAGNOSIS — E1159 Type 2 diabetes mellitus with other circulatory complications: Secondary | ICD-10-CM | POA: Diagnosis not present

## 2014-10-04 DIAGNOSIS — F17200 Nicotine dependence, unspecified, uncomplicated: Secondary | ICD-10-CM

## 2014-10-04 DIAGNOSIS — Z720 Tobacco use: Secondary | ICD-10-CM | POA: Diagnosis not present

## 2014-10-04 DIAGNOSIS — E1165 Type 2 diabetes mellitus with hyperglycemia: Secondary | ICD-10-CM | POA: Diagnosis not present

## 2014-10-04 DIAGNOSIS — I482 Chronic atrial fibrillation, unspecified: Secondary | ICD-10-CM

## 2014-10-04 DIAGNOSIS — I10 Essential (primary) hypertension: Secondary | ICD-10-CM

## 2014-10-04 DIAGNOSIS — E1151 Type 2 diabetes mellitus with diabetic peripheral angiopathy without gangrene: Secondary | ICD-10-CM

## 2014-10-04 LAB — BASIC METABOLIC PANEL
BUN: 30 mg/dL — ABNORMAL HIGH (ref 6–23)
CO2: 29 mEq/L (ref 19–32)
Calcium: 8.4 mg/dL (ref 8.4–10.5)
Chloride: 101 mEq/L (ref 96–112)
Creat: 1.07 mg/dL (ref 0.50–1.35)
Glucose, Bld: 158 mg/dL — ABNORMAL HIGH (ref 70–99)
POTASSIUM: 4.1 meq/L (ref 3.5–5.3)
Sodium: 136 mEq/L (ref 135–145)

## 2014-10-04 LAB — GLUCOSE, POCT (MANUAL RESULT ENTRY): POC Glucose: 179 mg/dl — AB (ref 70–99)

## 2014-10-04 MED ORDER — FUROSEMIDE 40 MG PO TABS: ORAL_TABLET | ORAL | Status: DC

## 2014-10-04 NOTE — Patient Instructions (Signed)

## 2014-10-04 NOTE — Progress Notes (Signed)
Subjective:    Patient ID: Jacob [name], male    DOB: 29-Nov-1955, 59 y.o.   MRN: [id number]  Transitional care clinic  Date of telephone Encounter:  09/29/14  Date of most recent ED visit: 09/28/14  HPI  Jacob [name] is a 59 year old homeless male whom I have seen previously who attended the [facility] for 30 days and was transitioned out never made it to his appointment with his PCP; PMH is notable for a history of CAD status post CABG 2, severe ischemic cardiomyopathy with EF of 20-25% status post ICD, hypertension, hyperlipidemia, type 2 diabetes mellitus, atrial fibrillation on anticoagulation with Xarelto, PVD.  He was scheduled to see Dr. Daleen Squibb on 09/28/14 for follow-up of his congestive heart failure but missed the appointment as he says he forgot;  later on presented to the ED that same day with shortness of breath subsequently left AGAINST MEDICAL ADVICE prior to being evaluated. He has been on Lasix 80 mg twice a day (but was supposed to be on 120 mg in the morning and 80 mg in the evening ) due to elevated pro BNP which trended up from 1577 to 3006.He had complained of  cough, pleuritic chest pain, dyspnea on mild exertion ,I referred him for a CXR which revealed trace left pleural effusion, bilateral diffuse interstitial thickening, mild stable cardiomegaly findings most consistent with mild CHF. And so he received 40 mg of IV Lasix push at his last visit.   He was seen by cardiology on 09/13/14 and his Coreg was substituted with metoprolol, he had his device check and the plan was for repeat EKG in 2 weeks  Interval history: States his breathing is better than at his last office visit but not at his best. He does endorse some pedal edema and orthopnea and paroxysmal nocturnal dyspnea. He takes glipizide for his diabetes.  Past Medical History  Diagnosis Date  . Hypertension   . Systolic heart failure     EF is 40-45% by echo, December 2013  . Noncompliance   . CAD  (coronary artery disease) Sept 2013    s/p cardiac cath showing occlusion of small RCA with collaterals  . Chronic anticoagulation     on coumadin  . Atrial fibrillation   . Peripheral arterial disease   . Automatic implantable cardioverter-defibrillator in situ   . High cholesterol   . Myocardial infarction 2014  . Type II diabetes mellitus     Past Surgical History  Procedure Laterality Date  . Implantable cardioverter defibrillator implant      [place] in 07/2012; [employer]  . Coronary artery bypass graft  09/2012    2 vessels per patient [name]   . Coronary angioplasty with stent placement  11/2011    "1"  . Cardiac catheterization  09/2012  . Iliac artery stent Right 08/30/2013  . Left and right heart catheterization with coronary angiogram N/A 12/02/2011    Procedure: LEFT AND RIGHT HEART CATHETERIZATION WITH CORONARY ANGIOGRAM;  Surgeon: Kathleene Hazel, MD;  Location: [facility] CATH LAB;  Service: Cardiovascular;  Laterality: N/A;  . Lower extremity angiogram N/A 08/30/2013    Procedure: LOWER EXTREMITY ANGIOGRAM;  Surgeon: Runell Gess, MD;  Location: [facility] CATH LAB;  Service: Cardiovascular;  Laterality: N/A;  . Lower extremity angiogram N/A 12/02/2013    Procedure: LOWER EXTREMITY ANGIOGRAM;  Surgeon: Runell Gess, MD;  Location: [facility] CATH LAB;  Service: Cardiovascular;  Laterality: N/A;   Family History  Problem Relation  Age of Onset  . Diabetes Mother     History   Social History  . Marital Status: Divorced    Spouse Name: N/A  . Number of Children: 3  . Years of Education: N/A   Occupational History  . Unemployed    Social History Main Topics  . Smoking status: Current Every Day Smoker -- 1.00 packs/day for 38 years    Types: Cigarettes  . Smokeless tobacco: Never Used  . Alcohol Use: No  . Drug Use: No  . Sexual Activity: Yes   Other Topics Concern  . Not on file   Social History Narrative   Has an apartment with a roommate. He was living on  the streets in 12/25/12.  He reports that his father died in [place] in Dec 25, 2012.  He is divorced.  He is no longer estranged from his son, but still from his daughter who lives locally.  Neither of his parents, nor any siblings have any history of CAD.    No Known Allergies  Current Outpatient Prescriptions on File Prior to Visit  Medication Sig Dispense Refill  . albuterol (PROVENTIL HFA;VENTOLIN HFA) 108 (90 BASE) MCG/ACT inhaler Inhale 2 puffs into the lungs every 6 (six) hours as needed for wheezing or shortness of breath. 1 Inhaler 0  . albuterol (PROVENTIL) (2.5 MG/3ML) 0.083% nebulizer solution Take 3 mLs (2.5 mg total) by nebulization every 6 (six) hours as needed for wheezing or shortness of breath. 150 mL 1  . glipiZIDE (GLUCOTROL) 5 MG tablet Take 1 tablet (5 mg total) by mouth 2 (two) times daily before a meal. 60 tablet 0  . metoprolol 75 MG TABS Take 75 mg by mouth 2 (two) times daily. 60 tablet 2  . nicotine (NICODERM CQ) 7 mg/24hr patch Place 1 patch (7 mg total) onto the skin daily. 28 patch 0  . rivaroxaban (XARELTO) 20 MG TABS tablet Take 1 tablet (20 mg total) by mouth daily before supper. 30 tablet 6  . spironolactone (ALDACTONE) 25 MG tablet Take 1 tablet (25 mg total) by mouth daily. 30 tablet 6  . traMADol (ULTRAM) 50 MG tablet Take 1 tablet (50 mg total) by mouth every 8 (eight) hours as needed. (Patient taking differently: Take 50 mg by mouth every 8 (eight) hours as needed for moderate pain. ) 30 tablet 0   Current Facility-Administered Medications on File Prior to Visit  Medication Dose Route Frequency Provider Last Rate Last Dose  . furosemide (LASIX) injection 40 mg  40 mg Intravenous Once Jaclyn Shaggy, MD         Review of Systems  Constitutional: Negative for activity change and appetite change.  HENT: Negative for sinus pressure and sore throat.   Eyes: Negative for visual disturbance.  Respiratory: Positive for cough and shortness of breath.  Negative for chest tightness.   Cardiovascular: Positive for leg swelling. Negative for chest pain and palpitations.  Gastrointestinal: Negative for abdominal pain and abdominal distention.  Endocrine: Negative for cold intolerance, heat intolerance and polyphagia.  Genitourinary: Negative for dysuria, frequency and difficulty urinating.  Musculoskeletal: Negative for back pain, joint swelling and arthralgias.  Skin: Negative for color change.  Neurological: Negative for dizziness, tremors and weakness.  Psychiatric/Behavioral: Negative for suicidal ideas and behavioral problems.         Objective: Filed Vitals:   10/04/14 1232  BP: 117/82  Pulse: 135  Temp: 97.4 F (36.3 C)  Resp: 20  Height:  (1.626 m)  Weight:  232 lb (105.235 kg)  SpO2: 94%      Physical Exam  Constitutional: He is oriented to person, place, and time. He appears well-developed and well-nourished.  HENT:  Head: Normocephalic and atraumatic.  Right Ear: External ear normal.  Left Ear: External ear normal.  Eyes: Conjunctivae and EOM are normal. Pupils are equal, round, and reactive to light.  Neck: Normal range of motion. Neck supple. JVD present. No tracheal deviation present.  Cardiovascular: Normal heart sounds.  An irregularly irregular rhythm present. Tachycardia present.   No murmur heard. Pulmonary/Chest: Effort normal. No respiratory distress. He has no wheezes. He has rales (bilateral basilar rales). He exhibits no tenderness.  Abdominal: Soft. Bowel sounds are normal. He exhibits no mass. There is no tenderness.  Musculoskeletal: Normal range of motion. He exhibits edema (bilateral non pitting pedal edema to the mid calf). He exhibits no tenderness.  Neurological: He is alert and oriented to person, place, and time.  Skin: Skin is warm and dry.  Psychiatric: He has a normal mood and affect.    Transthoracic Echocardiography  Patient:  Jacob [name], Jacob [name] MR #:    [id number] Study Date:  08/03/2014 Gender:   M Age:    35 Height:   165.1 cm Weight:   109.8 kg BSA:    2.3 m^2 Pt. Status: Room:    3W21C  ADMITTING  Pearson Grippe [id number]   AOZ, HYQMV [id number] Orma Flaming [id number] SONOGRAPHER Nolon Rod, RDCS ATTENDING  [place], Haywood Pao [id number] PERFORMING  [facility], Inpatient  cc:  ------------------------------------------------------------------- LV EF: 20% -  25%  ------------------------------------------------------------------- Indications:   Atrial fibrillation - 427.31.  ------------------------------------------------------------------- History:  PMH:  Coronary artery disease. Congestive heart failure. PMH:  Myocardial infarction. Risk factors: Current tobacco use. Hypertension. Diabetes mellitus.  ------------------------------------------------------------------- Study Conclusions  - Left ventricle: The cavity size was normal. Left ventricular geometry showed evidence of eccentric hypertrophy. Systolic function was severely reduced. The estimated ejection fraction was in the range of 20% to 25%. Severe diffuse hypokinesis with no identifiable regional variations. Doppler parameters are consistent with elevated mean left atrial filling pressure. Acoustic contrast opacification revealed no evidence ofthrombus. - Left atrium: The atrium was severely dilated. - Right ventricle: The cavity size was mildly dilated. Systolic function was severely reduced. - Right atrium: The atrium was severely dilated. - Pericardium, extracardiac: There was a left pleural effusion.  ------------------------------------------------------------------- Labs, prior tests, procedures, and surgery: Currently implanted device: implantable cardioverter defibrillator.  Coronary artery bypass grafting. Transthoracic echocardiography. M-mode, complete 2D, spectral Doppler, and color Doppler. Birthdate: Patient  birthdate: 04/08/1955. Age: Patient is 59 yr old. Sex: Gender: male. BMI: 40.3 kg/m^2. Blood pressure:   136/82 Patient status: Inpatient. Study date: Study date: 08/03/2014. Study time: 12:25 PM. Location: Bedside.  -------------------------------------------------------------------  ------------------------------------------------------------------- Left ventricle: The cavity size was normal. Left ventricular geometry showed evidence of eccentric hypertrophy. Systolic function was severely reduced. The estimated ejection fraction was in the range of 20% to 25%. Severe diffuse hypokinesis with no identifiable regional variations. Acoustic contrast opacification revealed no evidence ofthrombus. The study was not technically sufficient to allow evaluation of LV diastolic dysfunction due to atrial fibrillation. Doppler parameters are consistent with elevated mean left atrial filling pressure.  ------------------------------------------------------------------- Aortic valve:  Structurally normal valve.  Cusp separation was normal. Doppler: Transvalvular velocity was within the normal range. There was no stenosis. There was no regurgitation.  ------------------------------------------------------------------- Aorta: Aortic root: The aortic root was normal in size. Ascending aorta: The ascending aorta was normal  in size.  ------------------------------------------------------------------- Mitral valve:  Structurally normal valve.  Leaflet separation was normal. Doppler: Transvalvular velocity was within the normal range. There was no evidence for stenosis. There was trivial regurgitation.  Peak gradient (D): 4 mm Hg.  ------------------------------------------------------------------- Left atrium: The atrium was severely dilated.  ------------------------------------------------------------------- Right ventricle: The cavity size was mildly dilated. Pacer  wire or catheter noted in right ventricle. Systolic function was severely reduced.  ------------------------------------------------------------------- Pulmonic valve:  Poorly visualized.  ------------------------------------------------------------------- Tricuspid valve:  Structurally normal valve.  Leaflet separation was normal. Doppler: Transvalvular velocity was within the normal range. There was no regurgitation.  ------------------------------------------------------------------- Pulmonary artery:  Systolic pressure could not be accurately estimated.  ------------------------------------------------------------------- Right atrium: The atrium was severely dilated.  ------------------------------------------------------------------- Pericardium: There was no pericardial effusion.  ------------------------------------------------------------------- Pleura: There was a left pleural effusion.        Assessment & Plan:  59 year old homeless male with a history of CAD status post CABG 2, severe ischemic cardiomyopathy with EF of 20-25% status post ICD, hypertension, hyperlipidemia, type 2 diabetes mellitus, atrial fibrillation on anticoagulation with Xarelto, PVD with symptoms of CHF exacerbation.  Acute on chronic CHF: EF of 20-25% Gained 6lbs in last 3weeks Advised to increase Lasix from 80 mg twice daily to 120 mg in the morning and 80 mg at bedtime. Pro BNP, CMET today He has ascale and is to weigh daily, limit fluids to 2 L per day, low-sodium diet.  Hypertension: Controlled on metopolol 75 mg twice daily .   Type 2 diabetes mellitus: Uncontrolled with A1c of 8.4, CBG is normal   Atrial fibrillation: Continue anticoagulation with Xarelto, rate control with metoprolol. Advised to bring in all his medications at his next visit so we can verify the correct dosing.  Coronary artery disease: Status post stent.  This note has been created with Biomedical engineer. Any transcriptional errors are unintentional.

## 2014-10-04 NOTE — Progress Notes (Signed)
Patient here for CHF Unsure of his lasix dosage States he wears a nicotine patch but still smokes 1 ppd Denies pain or SOB

## 2014-10-05 ENCOUNTER — Telehealth: Payer: Self-pay | Admitting: *Deleted

## 2014-10-05 LAB — PRO B NATRIURETIC PEPTIDE: Pro B Natriuretic peptide (BNP): 2524 pg/mL — ABNORMAL HIGH (ref <126)

## 2014-10-05 NOTE — Telephone Encounter (Signed)
-----   Message from Jaclyn Shaggy, MD sent at 10/05/2014  8:16 AM EDT ----- Please advise him he still has fluid overload. Continue Lasix 120mg  in the morning and 80mg  at night.

## 2014-10-05 NOTE — Telephone Encounter (Signed)
Verified name and date of birth and verified patient took his 120 Mg Lasix this am and will take 80 mg tonight.

## 2014-10-11 ENCOUNTER — Encounter (HOSPITAL_COMMUNITY): Payer: Self-pay

## 2014-10-11 ENCOUNTER — Inpatient Hospital Stay (HOSPITAL_COMMUNITY)
Admission: EM | Admit: 2014-10-11 | Discharge: 2014-10-13 | DRG: 292 | Disposition: A | Discharge status: Home / Self Care | Payer: Medicaid Other | Attending: Internal Medicine | Admitting: Internal Medicine

## 2014-10-11 ENCOUNTER — Inpatient Hospital Stay (HOSPITAL_COMMUNITY): Payer: Medicaid Other

## 2014-10-11 ENCOUNTER — Emergency Department (HOSPITAL_COMMUNITY): Payer: Medicaid Other

## 2014-10-11 DIAGNOSIS — I5023 Acute on chronic systolic (congestive) heart failure: Secondary | ICD-10-CM | POA: Diagnosis not present

## 2014-10-11 DIAGNOSIS — Z79891 Long term (current) use of opiate analgesic: Secondary | ICD-10-CM

## 2014-10-11 DIAGNOSIS — I10 Essential (primary) hypertension: Secondary | ICD-10-CM | POA: Diagnosis present

## 2014-10-11 DIAGNOSIS — E1122 Type 2 diabetes mellitus with diabetic chronic kidney disease: Secondary | ICD-10-CM | POA: Diagnosis present

## 2014-10-11 DIAGNOSIS — I472 Ventricular tachycardia: Secondary | ICD-10-CM | POA: Diagnosis not present

## 2014-10-11 DIAGNOSIS — I4891 Unspecified atrial fibrillation: Secondary | ICD-10-CM | POA: Diagnosis present

## 2014-10-11 DIAGNOSIS — Z9111 Patient's noncompliance with dietary regimen: Secondary | ICD-10-CM | POA: Diagnosis present

## 2014-10-11 DIAGNOSIS — E780 Pure hypercholesterolemia: Secondary | ICD-10-CM | POA: Diagnosis present

## 2014-10-11 DIAGNOSIS — R0602 Shortness of breath: Secondary | ICD-10-CM | POA: Diagnosis not present

## 2014-10-11 DIAGNOSIS — I129 Hypertensive chronic kidney disease with stage 1 through stage 4 chronic kidney disease, or unspecified chronic kidney disease: Secondary | ICD-10-CM | POA: Diagnosis present

## 2014-10-11 DIAGNOSIS — I252 Old myocardial infarction: Secondary | ICD-10-CM

## 2014-10-11 DIAGNOSIS — D509 Iron deficiency anemia, unspecified: Secondary | ICD-10-CM | POA: Diagnosis present

## 2014-10-11 DIAGNOSIS — Z7901 Long term (current) use of anticoagulants: Secondary | ICD-10-CM | POA: Diagnosis not present

## 2014-10-11 DIAGNOSIS — I2510 Atherosclerotic heart disease of native coronary artery without angina pectoris: Secondary | ICD-10-CM | POA: Diagnosis present

## 2014-10-11 DIAGNOSIS — Z951 Presence of aortocoronary bypass graft: Secondary | ICD-10-CM

## 2014-10-11 DIAGNOSIS — F17200 Nicotine dependence, unspecified, uncomplicated: Secondary | ICD-10-CM | POA: Diagnosis present

## 2014-10-11 DIAGNOSIS — Z95810 Presence of automatic (implantable) cardiac defibrillator: Secondary | ICD-10-CM | POA: Diagnosis not present

## 2014-10-11 DIAGNOSIS — E1151 Type 2 diabetes mellitus with diabetic peripheral angiopathy without gangrene: Secondary | ICD-10-CM | POA: Diagnosis present

## 2014-10-11 DIAGNOSIS — Z590 Homelessness: Secondary | ICD-10-CM

## 2014-10-11 DIAGNOSIS — Z79899 Other long term (current) drug therapy: Secondary | ICD-10-CM

## 2014-10-11 DIAGNOSIS — F17210 Nicotine dependence, cigarettes, uncomplicated: Secondary | ICD-10-CM | POA: Diagnosis present

## 2014-10-11 DIAGNOSIS — N189 Chronic kidney disease, unspecified: Secondary | ICD-10-CM | POA: Diagnosis present

## 2014-10-11 DIAGNOSIS — R0600 Dyspnea, unspecified: Secondary | ICD-10-CM | POA: Diagnosis present

## 2014-10-11 DIAGNOSIS — Z9119 Patient's noncompliance with other medical treatment and regimen: Secondary | ICD-10-CM

## 2014-10-11 DIAGNOSIS — I4581 Long QT syndrome: Secondary | ICD-10-CM

## 2014-10-11 DIAGNOSIS — I739 Peripheral vascular disease, unspecified: Secondary | ICD-10-CM | POA: Diagnosis present

## 2014-10-11 DIAGNOSIS — Z91199 Patient's noncompliance with other medical treatment and regimen due to unspecified reason: Secondary | ICD-10-CM

## 2014-10-11 LAB — BASIC METABOLIC PANEL
Anion gap: 12 (ref 5–15)
BUN: 25 mg/dL — ABNORMAL HIGH (ref 6–20)
CO2: 25 mmol/L (ref 22–32)
Calcium: 8.4 mg/dL — ABNORMAL LOW (ref 8.9–10.3)
Chloride: 96 mmol/L — ABNORMAL LOW (ref 101–111)
Creatinine, Ser: 1.46 mg/dL — ABNORMAL HIGH (ref 0.61–1.24)
GFR calc Af Amer: 60 mL/min — ABNORMAL LOW (ref >60)
GFR, EST NON AFRICAN AMERICAN: 52 mL/min — AB (ref >60)
GLUCOSE: 252 mg/dL — AB (ref 65–99)
Potassium: 4.3 mmol/L (ref 3.5–5.1)
Sodium: 133 mmol/L — ABNORMAL LOW (ref 135–145)

## 2014-10-11 LAB — CBC
HEMATOCRIT: 37.8 % — AB (ref 39.0–52.0)
HEMOGLOBIN: 10.5 g/dL — AB (ref 13.0–17.0)
MCH: 19.4 pg — ABNORMAL LOW (ref 26.0–34.0)
MCHC: 27.8 g/dL — AB (ref 30.0–36.0)
MCV: 69.9 fL — AB (ref 78.0–100.0)
PLATELETS: 313 10*3/uL (ref 150–400)
RBC: 5.41 MIL/uL (ref 4.22–5.81)
RDW: 21.5 % — AB (ref 11.5–15.5)
WBC: 11.1 10*3/uL — AB (ref 4.0–10.5)

## 2014-10-11 LAB — I-STAT TROPONIN, ED: Troponin i, poc: 0.05 ng/mL (ref 0.00–0.08)

## 2014-10-11 LAB — GLUCOSE, CAPILLARY
GLUCOSE-CAPILLARY: 191 mg/dL — AB (ref 65–99)
Glucose-Capillary: 194 mg/dL — ABNORMAL HIGH (ref 65–99)

## 2014-10-11 LAB — D-DIMER, QUANTITATIVE: D-Dimer, Quant: 2.63 ug/mL-FEU — ABNORMAL HIGH (ref 0.00–0.48)

## 2014-10-11 LAB — RAPID URINE DRUG SCREEN, HOSP PERFORMED
Amphetamines: NOT DETECTED
BARBITURATES: NOT DETECTED
Benzodiazepines: NOT DETECTED
Cocaine: NOT DETECTED
Opiates: NOT DETECTED
Tetrahydrocannabinol: NOT DETECTED

## 2014-10-11 LAB — PROTIME-INR
INR: 1.81 — AB (ref 0.00–1.49)
Prothrombin Time: 20.9 seconds — ABNORMAL HIGH (ref 11.6–15.2)

## 2014-10-11 LAB — TROPONIN I
TROPONIN I: 0.08 ng/mL — AB (ref <0.031)
Troponin I: 0.07 ng/mL — ABNORMAL HIGH (ref <0.031)

## 2014-10-11 LAB — BRAIN NATRIURETIC PEPTIDE: B NATRIURETIC PEPTIDE 5: 1134.1 pg/mL — AB (ref 0.0–100.0)

## 2014-10-11 LAB — CBG MONITORING, ED: Glucose-Capillary: 185 mg/dL — ABNORMAL HIGH (ref 65–99)

## 2014-10-11 MED ORDER — SPIRONOLACTONE 25 MG PO TABS
25.0000 mg | ORAL_TABLET | Freq: Every day | ORAL | Status: DC
  Administered 2014-10-11 – 2014-10-13 (×3): 25 mg via ORAL
  Filled 2014-10-11 (×3): qty 1

## 2014-10-11 MED ORDER — ACETAMINOPHEN 325 MG PO TABS
650.0000 mg | ORAL_TABLET | Freq: Four times a day (QID) | ORAL | Status: DC | PRN
  Administered 2014-10-12: 650 mg via ORAL
  Filled 2014-10-11: qty 2

## 2014-10-11 MED ORDER — SODIUM CHLORIDE 0.9 % IJ SOLN
3.0000 mL | Freq: Two times a day (BID) | INTRAMUSCULAR | Status: DC
Start: 2014-10-11 — End: 2014-10-13
  Administered 2014-10-11 – 2014-10-13 (×4): 3 mL via INTRAVENOUS

## 2014-10-11 MED ORDER — ACETAMINOPHEN 650 MG RE SUPP: 650.0000 mg | Freq: Four times a day (QID) | RECTAL | Status: DC | PRN

## 2014-10-11 MED ORDER — SODIUM CHLORIDE 0.9 % IJ SOLN: 3.0000 mL | INTRAMUSCULAR | Status: DC | PRN

## 2014-10-11 MED ORDER — INSULIN ASPART 100 UNIT/ML ~~LOC~~ SOLN
0.0000 [IU] | Freq: Every day | SUBCUTANEOUS | Status: DC
  Administered 2014-10-12: 3 [IU] via SUBCUTANEOUS

## 2014-10-11 MED ORDER — ATORVASTATIN CALCIUM 40 MG PO TABS
40.0000 mg | ORAL_TABLET | Freq: Every day | ORAL | Status: DC
  Administered 2014-10-11: 40 mg via ORAL
  Filled 2014-10-11 (×4): qty 1

## 2014-10-11 MED ORDER — ASPIRIN EC 81 MG PO TBEC
81.0000 mg | DELAYED_RELEASE_TABLET | Freq: Every day | ORAL | Status: DC
  Administered 2014-10-11 – 2014-10-13 (×3): 81 mg via ORAL
  Filled 2014-10-11 (×3): qty 1

## 2014-10-11 MED ORDER — INSULIN ASPART 100 UNIT/ML ~~LOC~~ SOLN
0.0000 [IU] | Freq: Three times a day (TID) | SUBCUTANEOUS | Status: DC
  Administered 2014-10-11: 3 [IU] via SUBCUTANEOUS
  Administered 2014-10-12: 8 [IU] via SUBCUTANEOUS
  Administered 2014-10-12: 2 [IU] via SUBCUTANEOUS
  Administered 2014-10-13: 5 [IU] via SUBCUTANEOUS

## 2014-10-11 MED ORDER — RIVAROXABAN 20 MG PO TABS
20.0000 mg | ORAL_TABLET | Freq: Every day | ORAL | Status: DC
  Administered 2014-10-11: 20 mg via ORAL
  Filled 2014-10-11 (×4): qty 1

## 2014-10-11 MED ORDER — ALBUTEROL SULFATE (2.5 MG/3ML) 0.083% IN NEBU
2.5000 mg | INHALATION_SOLUTION | RESPIRATORY_TRACT | Status: DC
  Administered 2014-10-11 – 2014-10-12 (×8): 2.5 mg via RESPIRATORY_TRACT
  Filled 2014-10-11 (×9): qty 3

## 2014-10-11 MED ORDER — NICOTINE 7 MG/24HR TD PT24
7.0000 mg | MEDICATED_PATCH | Freq: Every day | TRANSDERMAL | Status: DC
  Administered 2014-10-11 – 2014-10-13 (×3): 7 mg via TRANSDERMAL
  Filled 2014-10-11 (×3): qty 1

## 2014-10-11 MED ORDER — SODIUM CHLORIDE 0.9 % IJ SOLN
3.0000 mL | Freq: Two times a day (BID) | INTRAMUSCULAR | Status: DC
  Administered 2014-10-11 – 2014-10-13 (×3): 3 mL via INTRAVENOUS

## 2014-10-11 MED ORDER — METOPROLOL TARTRATE 50 MG PO TABS
75.0000 mg | ORAL_TABLET | Freq: Two times a day (BID) | ORAL | Status: DC
  Administered 2014-10-11 – 2014-10-13 (×5): 75 mg via ORAL
  Filled 2014-10-11 (×4): qty 1
  Filled 2014-10-11: qty 3
  Filled 2014-10-11: qty 1

## 2014-10-11 MED ORDER — SODIUM CHLORIDE 0.9 % IV SOLN: 250.0000 mL | INTRAVENOUS | Status: DC | PRN

## 2014-10-11 MED ORDER — IOHEXOL 350 MG/ML SOLN
100.0000 mL | Freq: Once | INTRAVENOUS | Status: AC | PRN
  Administered 2014-10-11: 100 mL via INTRAVENOUS

## 2014-10-11 MED ORDER — FUROSEMIDE 10 MG/ML IJ SOLN
80.0000 mg | Freq: Three times a day (TID) | INTRAMUSCULAR | Status: DC
  Administered 2014-10-11 – 2014-10-12 (×6): 80 mg via INTRAVENOUS
  Filled 2014-10-11 (×10): qty 8

## 2014-10-11 NOTE — ED Notes (Signed)
Pt is upset and cursing at this NT because this NT will not un hook him from the heart monitor and is refusing to eat his breakfast tray

## 2014-10-11 NOTE — ED Notes (Signed)
Pt requesting to be left alone at this time. Pt on monitor; and reinforced why he needs to stay on it.

## 2014-10-11 NOTE — Progress Notes (Signed)
Pt stated "I left cell phone and keys in emergency room".  Call placed to ED spoke with Juanda Bond and Fayrene Fearing both said they have not seen these items.  Called and spoke with charge nurse, instructed she will f/u with the nurse  who had left for the day.  Pt made aware.  Amanda Pea, Charity fundraiser.

## 2014-10-11 NOTE — ED Notes (Signed)
Patient transported to CT without distress 

## 2014-10-11 NOTE — ED Notes (Signed)
Admitting MD at bedside.

## 2014-10-11 NOTE — ED Provider Notes (Signed)
CSN: [id number]     Arrival date & time 10/11/14  0456 History   First MD Initiated Contact with Patient 10/11/14 0501     Chief Complaint  Patient presents with  . Shortness of Breath     (Consider location/radiation/quality/duration/timing/severity/associated sxs/prior Treatment) Patient is a 59 y.o. male presenting with shortness of breath.  Shortness of Breath Severity:  Moderate Onset quality:  Gradual Duration:  1 day Timing:  Constant Progression:  Worsening Chronicity:  Recurrent Context comment:  Intermittent compliance with home diuretics Relieved by:  Nothing Worsened by:  Exertion Ineffective treatments:  None tried Associated symptoms: cough   Associated symptoms: no abdominal pain, no chest pain, no fever and no vomiting     Past Medical History  Diagnosis Date  . Hypertension   . Systolic heart failure     EF is 40-45% by echo, December 2013  . Noncompliance   . CAD (coronary artery disease) Sept 2013    s/p cardiac cath showing occlusion of small RCA with collaterals  . Chronic anticoagulation     on coumadin  . Atrial fibrillation   . Peripheral arterial disease   . Automatic implantable cardioverter-defibrillator in situ   . High cholesterol   . Myocardial infarction 2014  . Type II diabetes mellitus    Past Surgical History  Procedure Laterality Date  . Implantable cardioverter defibrillator implant      [place] in 07/2012; [employer]  . Coronary artery bypass graft  09/2012    2 vessels per patient [name]   . Coronary angioplasty with stent placement  11/2011    "1"  . Cardiac catheterization  09/2012  . Iliac artery stent Right 08/30/2013  . Left and right heart catheterization with coronary angiogram N/A 12/02/2011    Procedure: LEFT AND RIGHT HEART CATHETERIZATION WITH CORONARY ANGIOGRAM;  Surgeon: Kathleene Hazel, MD;  Location: [facility] CATH LAB;  Service: Cardiovascular;  Laterality: N/A;  . Lower extremity angiogram N/A 08/30/2013     Procedure: LOWER EXTREMITY ANGIOGRAM;  Surgeon: Runell Gess, MD;  Location: [facility] CATH LAB;  Service: Cardiovascular;  Laterality: N/A;  . Lower extremity angiogram N/A 12/02/2013    Procedure: LOWER EXTREMITY ANGIOGRAM;  Surgeon: Runell Gess, MD;  Location: [facility] CATH LAB;  Service: Cardiovascular;  Laterality: N/A;   Family History  Problem Relation Age of Onset  . Diabetes Mother    History  Substance Use Topics  . Smoking status: Current Every Day Smoker -- 1.00 packs/day for 38 years    Types: Cigarettes  . Smokeless tobacco: Never Used  . Alcohol Use: No    Review of Systems  Constitutional: Negative for fever.  Respiratory: Positive for cough and shortness of breath.   Cardiovascular: Negative for chest pain.  Gastrointestinal: Negative for vomiting and abdominal pain.  All other systems reviewed and are negative.     Allergies  Review of patient's allergies indicates no known allergies.  Home Medications   Prior to Admission medications   Medication Sig Start Date End Date Taking? Authorizing Provider  albuterol (PROVENTIL HFA;VENTOLIN HFA) 108 (90 BASE) MCG/ACT inhaler Inhale 2 puffs into the lungs every 6 (six) hours as needed for wheezing or shortness of breath. 09/30/14  Yes [name], MD  albuterol (PROVENTIL) (2.5 MG/3ML) 0.083% nebulizer solution Take 3 mLs (2.5 mg total) by nebulization every 6 (six) hours as needed for wheezing or shortness of breath. 09/06/14  Yes Jaclyn Shaggy, MD  furosemide (LASIX) 40 MG tablet 3 tabs (  ) in the morning and 2 tabs ( ) in the evening 10/04/14  Yes Jaclyn Shaggy, MD  glipiZIDE (GLUCOTROL) 5 MG tablet Take 1 tablet (5 mg total) by mouth 2 (two) times daily before a meal. 04/12/14  Yes Olugbemiga E Hyman Hopes, MD  metoprolol 75 MG TABS Take 75 mg by mouth 2 (two) times daily. 09/21/14  Yes Jaclyn Shaggy, MD  nicotine (NICODERM CQ) 7 mg/24hr patch Place 1 patch (7 mg total) onto the skin daily. 08/29/14  Yes Jaclyn Shaggy, MD   rivaroxaban (XARELTO) 20 MG TABS tablet Take 1 tablet (20 mg total) by mouth daily before supper. 08/20/14  Yes Leone Brand, NP  spironolactone (ALDACTONE) 25 MG tablet Take 1 tablet (25 mg total) by mouth daily. 04/12/14  Yes Olugbemiga Annitta Needs, MD  traMADol (ULTRAM) 50 MG tablet Take 1 tablet (50 mg total) by mouth every 8 (eight) hours as needed. Patient taking differently: Take 50 mg by mouth every 8 (eight) hours as needed for moderate pain.  09/20/14  Yes Enobong Amao, MD   BP 105/59 mmHg  Pulse 115  Temp(Src) 96.6 F (35.9 C) (Axillary)  Resp 33  SpO2 85% Physical Exam  Constitutional: He is oriented to person, place, and time. He appears well-developed and well-nourished.  HENT:  Head: Normocephalic and atraumatic.  Eyes: Conjunctivae and EOM are normal.  Neck: Normal range of motion. Neck supple.  Cardiovascular: Normal rate, regular rhythm and normal heart sounds.   Pulmonary/Chest: Effort normal. No respiratory distress. He has rales in the right lower field and the left lower field.  Abdominal: He exhibits no distension. There is no tenderness. There is no rebound and no guarding.  Musculoskeletal: Normal range of motion.  Neurological: He is alert and oriented to person, place, and time.  Skin: Skin is warm and dry.  Vitals reviewed.   ED Course  Procedures (including critical care time) Labs Review Labs Reviewed  BASIC METABOLIC PANEL - Abnormal; Notable for the following:    Sodium 133 (*)    Chloride 96 (*)    Glucose, Bld 252 (*)    BUN 25 (*)    Creatinine, Ser 1.46 (*)    Calcium 8.4 (*)    GFR calc non Af Amer 52 (*)    GFR calc Af Amer 60 (*)    All other components within normal limits  CBC - Abnormal; Notable for the following:    WBC 11.1 (*)    Hemoglobin 10.5 (*)    HCT 37.8 (*)    MCV 69.9 (*)    MCH 19.4 (*)    MCHC 27.8 (*)    RDW 21.5 (*)    All other components within normal limits  PROTIME-INR - Abnormal; Notable for the following:     Prothrombin Time 20.9 (*)    INR 1.81 (*)    All other components within normal limits  D-DIMER, QUANTITATIVE (NOT AT [facility]) - Abnormal; Notable for the following:    D-Dimer, Quant 2.63 (*)    All other components within normal limits  BRAIN NATRIURETIC PEPTIDE  I-STAT TROPOININ, ED    Imaging Review Dg Chest 2 View  10/11/2014   CLINICAL DATA:  Acute onset of left-sided chest pain and shortness of breath. Initial encounter.  EXAM: CHEST  2 VIEW  COMPARISON:  Chest radiograph performed 09/28/2014  FINDINGS: A small right pleural effusion is noted. Vascular congestion is seen, with mildly increased interstitial markings, possibly reflecting mild interstitial edema. No pneumothorax is identified.  The cardiomediastinal silhouette is enlarged. The patient is status post median sternotomy. An AICD is noted at the left chest wall, with a single lead ending at the right ventricle. No acute osseous abnormalities are seen.  IMPRESSION: Small right pleural effusion noted. Vascular congestion and cardiomegaly, with increased interstitial markings, likely reflecting mild interstitial edema.   Electronically Signed   By: Roanna Raider M.D.   On: 10/11/2014 05:54     EKG Interpretation   Date/Time:  Tuesday October 11 2014 05:01:22 EDT Ventricular Rate:  114 PR Interval:    QRS Duration: 98 QT Interval:  378 QTC Calculation: 521 R Axis:   -80 Text Interpretation:  Atrial fibrillation Left anterior fascicular block  Anteroseptal infarct, old Nonspecific T abnormalities, lateral leads  Prolonged QT interval No significant change since last tracing Confirmed  by Mirian Mo [phone]) on 10/11/2014 5:08:41 AM      MDM   Final diagnoses:  None    59 y.o. male with pertinent PMH of ischemic CHF (20-25%), DM, CAD, afib presents with recurrent dyspnea. On arrival patient has vital signs and physical exam as above, consistent with volume overload. Workup with elevation in creatinine, positive  d-dimer. Patient also mildly hypoxic at times. Feel he necessitates admission for gentle diuresis.  CT scan for PE ordered. Feel this is necessary despite elevation in creatinine.  Admitted in stable condition.    I have reviewed all laboratory and imaging studies if ordered as above  CHF exacerbation     Mirian Mo, MD 10/11/14 [phone]

## 2014-10-11 NOTE — Progress Notes (Signed)
Pt arrived to floor from the ED via stretcher.  A&0x4.  Introduced self to pt.  Denies pain/discomfort.  Call bell at reach.  Instructed to call for assistance as needed.  Verbalized understanding.   Will continue to monitor.  Oddie Kuhlmann,RN.

## 2014-10-11 NOTE — Hospital Discharge Follow-Up (Signed)
The patient is known to the  TCC at the [facility].  Will follow his hospital progress.

## 2014-10-11 NOTE — ED Notes (Signed)
Pt arrived by POV c/o shortness of breath starting yesterday and left sided chest pain 5/10.

## 2014-10-11 NOTE — Progress Notes (Signed)
Pt found his cell phone and keys in his shoes at the bed sdie.  Mellisa, charge nurse in ED notified.  Amanda Pea, Charity fundraiser.

## 2014-10-11 NOTE — ED Notes (Signed)
Report given to [facility] RN

## 2014-10-11 NOTE — ED Notes (Signed)
Ordered heart healthy meal tray. 

## 2014-10-11 NOTE — ED Notes (Signed)
Pt's breathing tx finished.

## 2014-10-11 NOTE — H&P (Signed)
Date: 10/11/2014               Patient Name:  Jacob [name] MRN: [id number]  DOB: 1955/12/28 Age / Sex: 59 y.o., male   PCP: [name], MD         Medical Service: Internal Medicine Teaching Service         Attending Physician: Dr. Earl Lagos, MD    First Contact: Dr. Reggie Pile Pager: [phone]  Second Contact: Dr. Lauris Chroman Pager: [phone]       After Hours (After 5p/  First Contact Pager: [phone]  weekends / holidays): Second Contact Pager: [phone]   Chief Complaint: dyspnea  History of Present Illness: Jacob [name] is a 59 yo homeless male with PMH of Afib, sCHF (EF 20-25%), CAD s/p CABG (2014), HTN, T2DM w/ PVD.  Who presents to the ED with CC of dyspnea.  He reports 1-2 days of progressive dyspnea, orthopnea, and lower extremity swelling.  He notes that he has been taking his medications including metoprolol, lasix, spironolactone, albuterol (PRN), and glipizide but did miss one dose of his Xarelto yesterday as he ran out.  He does report that he is able to get his medications without difficulty from the [facility] and does not have problems taking his medications but admits that his homelessness makes compliance more challenging.  He does report excessive salt intake, he denies adding salt to foods but reports an almost exclusive diet of fast food.  He reports that he does not want to return to a homeless shelter as he does not use drugs or drink ETOH and reports that the homeless shelters cater to that behavior.  Apparently a [facility] program previously helped him but he notes he has been homeless for about the past 5 months.  He does occasionally stay with friends and was recently doing well staying in a friend's car but it has been too hot recently to do that.  Meds: Current Facility-Administered Medications  Medication Dose Route Frequency Provider Last Rate Last Dose  . 0.9 %  sodium chloride infusion  250 mL Intravenous PRN Gust Rung, DO      . acetaminophen  (TYLENOL) tablet 650 mg  650 mg Oral Q6H PRN Gust Rung, DO       Or  . acetaminophen (TYLENOL) suppository 650 mg  650 mg Rectal Q6H PRN Gust Rung, DO      . albuterol (PROVENTIL) (2.5 MG/3ML) 0.083% nebulizer solution 2.5 mg  2.5 mg Nebulization Q4H Gust Rung, DO   2.5 mg at 10/11/14 0946  . aspirin EC tablet 81 mg  81 mg Oral Daily Gust Rung, DO      . atorvastatin (LIPITOR) tablet 40 mg  40 mg Oral q1800 Gust Rung, DO      . furosemide (LASIX) injection 40 mg  40 mg Intravenous Once Jaclyn Shaggy, MD      . furosemide (LASIX) injection 80 mg  80 mg Intravenous TID Gust Rung, DO   80 mg at 10/11/14 0924  . insulin aspart (novoLOG) injection 0-15 Units  0-15 Units Subcutaneous TID WC Gust Rung, DO      . insulin aspart (novoLOG) injection 0-5 Units  0-5 Units Subcutaneous QHS Gust Rung, DO      . metoprolol tartrate (LOPRESSOR) tablet 75 mg  75 mg Oral BID Gust Rung, DO      . nicotine (NICODERM CQ - dosed in mg/24 hr) patch 7  mg  7 mg Transdermal Daily Gust Rung, DO      . rivaroxaban (XARELTO) tablet 20 mg  20 mg Oral QAC supper Gust Rung, DO      . sodium chloride 0.9 % injection 3 mL  3 mL Intravenous Q12H Gust Rung, DO      . sodium chloride 0.9 % injection 3 mL  3 mL Intravenous Q12H Gust Rung, DO      . sodium chloride 0.9 % injection 3 mL  3 mL Intravenous PRN Gust Rung, DO      . spironolactone (ALDACTONE) tablet 25 mg  25 mg Oral Daily Gust Rung, DO       Current Outpatient Prescriptions  Medication Sig Dispense Refill  . albuterol (PROVENTIL HFA;VENTOLIN HFA) 108 (90 BASE) MCG/ACT inhaler Inhale 2 puffs into the lungs every 6 (six) hours as needed for wheezing or shortness of breath. 1 Inhaler 0  . albuterol (PROVENTIL) (2.5 MG/3ML) 0.083% nebulizer solution Take 3 mLs (2.5 mg total) by nebulization every 6 (six) hours as needed for wheezing or shortness of breath. 150 mL 1  . furosemide (LASIX) 40 MG tablet 3  tabs ( ) in the morning and 2 tabs ( ) in the evening 150 tablet 1  . glipiZIDE (GLUCOTROL) 5 MG tablet Take 1 tablet (5 mg total) by mouth 2 (two) times daily before a meal. 60 tablet 0  . metoprolol 75 MG TABS Take 75 mg by mouth 2 (two) times daily. 60 tablet 2  . nicotine (NICODERM CQ) 7 mg/24hr patch Place 1 patch (7 mg total) onto the skin daily. 28 patch 0  . rivaroxaban (XARELTO) 20 MG TABS tablet Take 1 tablet (20 mg total) by mouth daily before supper. 30 tablet 6  . spironolactone (ALDACTONE) 25 MG tablet Take 1 tablet (25 mg total) by mouth daily. 30 tablet 6  . traMADol (ULTRAM) 50 MG tablet Take 1 tablet (50 mg total) by mouth every 8 (eight) hours as needed. (Patient taking differently: Take 50 mg by mouth every 8 (eight) hours as needed for moderate pain. ) 30 tablet 0    Allergies: Allergies as of 10/11/2014  . (No Known Allergies)   Past Medical History  Diagnosis Date  . Hypertension   . Systolic heart failure     EF is 40-45% by echo, December 2013  . Noncompliance   . CAD (coronary artery disease) Sept 2013    s/p cardiac cath showing occlusion of small RCA with collaterals  . Chronic anticoagulation     on coumadin  . Atrial fibrillation   . Peripheral arterial disease   . Automatic implantable cardioverter-defibrillator in situ   . High cholesterol   . Myocardial infarction 2014  . Type II diabetes mellitus    Past Surgical History  Procedure Laterality Date  . Implantable cardioverter defibrillator implant      [place] in 07/2012; [employer]  . Coronary artery bypass graft  09/2012    2 vessels per patient [name]   . Coronary angioplasty with stent placement  11/2011    "1"  . Cardiac catheterization  09/2012  . Iliac artery stent Right 08/30/2013  . Left and right heart catheterization with coronary angiogram N/A 12/02/2011    Procedure: LEFT AND RIGHT HEART CATHETERIZATION WITH CORONARY ANGIOGRAM;  Surgeon: Kathleene Hazel, MD;   Location: [facility] CATH LAB;  Service: Cardiovascular;  Laterality: N/A;  . Lower extremity angiogram N/A 08/30/2013    Procedure: LOWER  EXTREMITY ANGIOGRAM;  Surgeon: Runell Gess, MD;  Location: [facility] CATH LAB;  Service: Cardiovascular;  Laterality: N/A;  . Lower extremity angiogram N/A 12/02/2013    Procedure: LOWER EXTREMITY ANGIOGRAM;  Surgeon: Runell Gess, MD;  Location: [facility] CATH LAB;  Service: Cardiovascular;  Laterality: N/A;   Family History  Problem Relation Age of Onset  . Diabetes Mother    History   Social History  . Marital Status: Divorced    Spouse Name: N/A  . Number of Children: 3  . Years of Education: N/A   Occupational History  . Unemployed    Social History Main Topics  . Smoking status: Current Every Day Smoker -- 1.00 packs/day for 38 years    Types: Cigarettes  . Smokeless tobacco: Never Used  . Alcohol Use: No  . Drug Use: No  . Sexual Activity: Yes   Other Topics Concern  . Not on file   Social History Narrative   Has an apartment with a roommate. He was living on the streets in 2013/01/08.  He reports that his father died in [place] in 2013/01/08.  He is divorced.  He is no longer estranged from his son, but still from his daughter who lives locally.  Neither of his parents, nor any siblings have any history of CAD.    Review of Systems: Review of Systems  Constitutional: Negative for fever, chills, weight loss, malaise/fatigue and diaphoresis.  Eyes: Negative for blurred vision.  Respiratory: Positive for cough and shortness of breath. Negative for hemoptysis, sputum production and wheezing.   Cardiovascular: Positive for orthopnea and leg swelling. Negative for chest pain and claudication.  Gastrointestinal: Negative for heartburn, abdominal pain, blood in stool and melena.  Genitourinary: Negative for dysuria.  Musculoskeletal:       Denies calf pain  Skin: Negative for rash.  Neurological: Negative for dizziness and headaches.    Psychiatric/Behavioral: Negative for suicidal ideas and substance abuse.     Physical Exam: Blood pressure 114/98, pulse 67, temperature 96.6 F (35.9 C), temperature source Axillary, resp. rate 23, SpO2 97 %. Physical Exam  Constitutional: He is well-developed, well-nourished, and in no distress. No distress.  HENT:  Head: Normocephalic and atraumatic.  Mouth/Throat: Oropharynx is clear and moist.  Cardiovascular: Normal rate.  An irregularly irregular rhythm present. PMI is not displaced.   Pulses:      Dorsalis pedis pulses are 2+ on the right side, and 2+ on the left side.       Posterior tibial pulses are 2+ on the right side, and 1+ on the left side.  Pulmonary/Chest: Effort normal. No respiratory distress. He has no wheezes. He has rales (bibasilar).  Abdominal: Soft. Bowel sounds are normal. There is no tenderness. There is no rebound.  Musculoskeletal: He exhibits edema (1+ pedal edmea to knee bilaterally).  Nursing note and vitals reviewed.    Lab results: Basic Metabolic Panel:  Recent Labs  16/10/96 0524  NA 133*  K 4.3  CL 96*  CO2 25  GLUCOSE 252*  [name] 25*  CREATININE 1.46*  CALCIUM 8.4*   CBC:  Recent Labs  10/11/14 0524  WBC 11.1*  HGB 10.5*  HCT 37.8*  MCV 69.9*  PLT 313   D-Dimer:  Recent Labs  10/11/14 0626  DDIMER 2.63*   Coagulation:  Recent Labs  10/11/14 0524  LABPROT 20.9*  INR 1.81*   Urine Drug Screen: Drugs of Abuse     Component Value Date/Time   LABOPIA  NONE DETECTED 05/13/2013 2349   COCAINSCRNUR NONE DETECTED 05/13/2013 2349   LABBENZ NONE DETECTED 05/13/2013 2349   AMPHETMU NONE DETECTED 05/13/2013 2349   THCU NONE DETECTED 05/13/2013 2349   LABBARB NONE DETECTED 05/13/2013 2349      Imaging results:  Dg Chest 2 View  10/11/2014   CLINICAL DATA:  Acute onset of left-sided chest pain and shortness of breath. Initial encounter.  EXAM: CHEST  2 VIEW  COMPARISON:  Chest radiograph performed 09/28/2014   FINDINGS: A small right pleural effusion is noted. Vascular congestion is seen, with mildly increased interstitial markings, possibly reflecting mild interstitial edema. No pneumothorax is identified.  The cardiomediastinal silhouette is enlarged. The patient is status post median sternotomy. An AICD is noted at the left chest wall, with a single lead ending at the right ventricle. No acute osseous abnormalities are seen.  IMPRESSION: Small right pleural effusion noted. Vascular congestion and cardiomegaly, with increased interstitial markings, likely reflecting mild interstitial edema.   Electronically Signed   By: Roanna Raider M.D.   On: 10/11/2014 05:54   Ct Angio Chest Pe W/cm &/or Wo Cm  10/11/2014   CLINICAL DATA:  Shortness of breath and chest pain  EXAM: CT ANGIOGRAPHY CHEST WITH CONTRAST  TECHNIQUE: Multidetector CT imaging of the chest was performed using the standard protocol during bolus administration of intravenous contrast. Multiplanar CT image reconstructions and MIPs were obtained to evaluate the vascular anatomy.  CONTRAST:  OMNIPAQUE IOHEXOL 350 MG/ML SOLN  COMPARISON:  Chest radiograph October 11, 2014 ; CT abdomen May 17, 2012  FINDINGS: There is no demonstrable pulmonary embolus. There is atherosclerotic change in the aorta and visualized great vessels. There is no thoracic aortic aneurysm or dissection apparent.  There are bilateral pleural effusions with bibasilar edema. There is patchy atelectasis in both lung bases as well.  Thyroid appears normal. There is no appreciable thoracic adenopathy. There are scattered small mediastinal lymph nodes which do not meet size criteria for pathologic significance.  There is extensive native coronary artery calcification. Patient is status post coronary artery bypass grafting. Pacemaker lead is attached to the right ventricle. The pericardium is not thickened. There is mild cardiomegaly. There is evidence of reflux into the hepatic veins and  inferior vena cava suggesting increase in right heart pressure.  In the visualized upper abdomen, there is a persistent hyperplasia of the visualized left adrenal.  There is Degenerative change in the thoracic spine. There are no blastic or lytic bone lesions.  Review of the MIP images confirms the above findings.  IMPRESSION: No demonstrable pulmonary embolus.  Evidence of congestive heart failure.  Stable left adrenal hypertrophy. No adenopathy appreciable. Reflux of contrast into the hepatic veins and inferior vena cava suggests increase in right heart pressure. Multiple areas of atherosclerotic calcification.   Electronically Signed   By: Bretta Bang III M.D.   On: 10/11/2014 08:06    Other results: EKG: unchanged from previous tracings, nonspecific ST and T waves changes, atrial fibrillation, rate 114, prolonged QT interval.  Assessment & Plan by Problem:   Acute on chronic systolic CHF (congestive heart failure) - Patient presents with progressive dyspnea, orthopnea, and evidence of gross volume overload.  He reports compliance with home medications including lasix.  He does admit significant dietary indiscretions as he reports most of his meals are "fast food,"  I suspect this is the likely cause of his acute exacerbation if he is indeed taking his medications. - Will admit to telemerty -  IV lasix, will start at 80mg  TID - Will continue Metoprolol Tartrate 75mg  BID, (? Consider change to Metoprolol Succinate or Coreg give low EF HR) - Continue Spironolactone - Patient is not on ACEi, will not start right now as patient is being aggressively diuresed and just got IV contrast but consider starting later in admission or at follow up. - Patient is not on Aspirin despite ischemic cardiomyopathy, will start ASA 81mg     Essential hypertension - Continue metoprolol 75mg  BID - Spironolactone 25mg  daily - IF BP will tolerate will try to introduce ACEi but will first follow renal function after  contrast study    Noncompliance - Reports no issues with affording medications, has been taking all medications except 1 missed dose of xarelto.    CAD- s/p CABG July 2014 [facility] - No active chest pain, EKG wnl, CE negative.  He last saw Dr Ladona Ridgel ([facility]) 09/13/14 - Start ASA 81mg  daily - Start high intensity statin: Atorvastatin 40mg  daily    Microcytic anemia - Last anemia panel in 2014 suggests IDA. - Will repeat iron studies with AM labs.  No record of Colonoscopy.    PVD (peripheral vascular disease) - Patient with palpable pulses in bilateral lower extremities.  Has follow up with vascular surgery with imaging studies ordered. - Start ASA and Lipitor as above    DM (diabetes mellitus), type 2 with peripheral vascular complications - Last A1c 8.4 in May - Hold Glipizide - Start SSI-M    Atrial fibrillation with controlled ventricular response - Continue metoprolol - Resume Xarelto    Tobacco use disorder - Nicotine patch ordered    Prolonged QT on ECG - Has ICD in place.  Diet: Heart/Carb mod DVT: Xarelto Code: Full Dispo: Disposition is deferred at this time, awaiting improvement of current medical problems. Anticipated discharge in approximately 2 day(s).   The patient does have a current PCP ([name], MD) and does not need an [facility] hospital follow-up appointment after discharge.  The patient does not have transportation limitations that hinder transportation to clinic appointments.  Signed: Gust Rung, DO 10/11/2014, 10:07 AM

## 2014-10-11 NOTE — ED Notes (Signed)
Heart healthy diet ordered. 

## 2014-10-11 NOTE — ED Notes (Signed)
Bed placement called for assistance regarding assigned bed.

## 2014-10-12 ENCOUNTER — Ambulatory Visit: Payer: Medicaid Other | Admitting: Family Medicine

## 2014-10-12 LAB — GLUCOSE, CAPILLARY
GLUCOSE-CAPILLARY: 94 mg/dL (ref 65–99)
GLUCOSE-CAPILLARY: 261 mg/dL — AB (ref 65–99)
Glucose-Capillary: 131 mg/dL — ABNORMAL HIGH (ref 65–99)
Glucose-Capillary: 260 mg/dL — ABNORMAL HIGH (ref 65–99)

## 2014-10-12 LAB — BASIC METABOLIC PANEL
ANION GAP: 10 (ref 5–15)
BUN: 30 mg/dL — AB (ref 6–20)
CALCIUM: 8.4 mg/dL — AB (ref 8.9–10.3)
CO2: 31 mmol/L (ref 22–32)
CREATININE: 1.32 mg/dL — AB (ref 0.61–1.24)
Chloride: 91 mmol/L — ABNORMAL LOW (ref 101–111)
GFR calc Af Amer: >60 mL/min (ref >60)
GFR, EST NON AFRICAN AMERICAN: 58 mL/min — AB (ref >60)
GLUCOSE: 131 mg/dL — AB (ref 65–99)
Potassium: 3.5 mmol/L (ref 3.5–5.1)
Sodium: 132 mmol/L — ABNORMAL LOW (ref 135–145)

## 2014-10-12 LAB — TROPONIN I: Troponin I: 0.08 ng/mL — ABNORMAL HIGH (ref <0.031)

## 2014-10-12 LAB — IRON AND TIBC
IRON: 16 ug/dL — AB (ref 45–182)
Saturation Ratios: 4 % — ABNORMAL LOW (ref 17.9–39.5)
TIBC: 406 ug/dL (ref 250–450)
UIBC: 390 ug/dL

## 2014-10-12 LAB — FERRITIN: Ferritin: 28 ng/mL (ref 24–336)

## 2014-10-12 MED ORDER — POTASSIUM CHLORIDE CRYS ER 20 MEQ PO TBCR
40.0000 meq | EXTENDED_RELEASE_TABLET | Freq: Once | ORAL | Status: AC
  Administered 2014-10-12: 40 meq via ORAL
  Filled 2014-10-12: qty 2

## 2014-10-12 MED ORDER — ALBUTEROL SULFATE (2.5 MG/3ML) 0.083% IN NEBU: 2.5000 mg | INHALATION_SOLUTION | Freq: Four times a day (QID) | RESPIRATORY_TRACT | Status: DC

## 2014-10-12 MED ORDER — DOCUSATE SODIUM 100 MG PO CAPS
100.0000 mg | ORAL_CAPSULE | Freq: Every day | ORAL | Status: DC
  Administered 2014-10-13: 100 mg via ORAL
  Filled 2014-10-12: qty 1

## 2014-10-12 MED ORDER — FERROUS SULFATE 325 (65 FE) MG PO TABS
325.0000 mg | ORAL_TABLET | Freq: Every day | ORAL | Status: DC
  Administered 2014-10-13: 325 mg via ORAL
  Filled 2014-10-12 (×3): qty 1

## 2014-10-12 NOTE — Progress Notes (Signed)
Patient had 9 beat run of Vtach. Asymptomatic. On call MD notified. Will continue to monitor.

## 2014-10-12 NOTE — Hospital Discharge Follow-Up (Signed)
This Case Manager met with patient at bedside to discuss hospital follow-up appointment. Patient followed closely at [facility]. Hospital follow-up appointment obtained on 10/19/14 at 1200 with Dr. Jarold Song. Appointment placed on AVS. Patient updated of  Appointment.  Olga Coaster, RN CM updated.

## 2014-10-12 NOTE — Progress Notes (Signed)
Subjective: Pt seen and examined today. Denies having SOB or cough. States his leg swelling is better. No other complaints.   Objective: Vital signs in last 24 hours: Filed Vitals:   10/12/14 0555 10/12/14 1240 10/12/14 1317 10/12/14 1618  BP: 108/67  108/64   Pulse: 76  96   Temp: 98.3 F (36.8 C)  97.6 F (36.4 C)   TempSrc: Oral  Oral   Resp: 20  20   Height:      Weight: 231 lb 7.7 oz (105 kg)     SpO2: 100% 98% 100% 98%   Weight change:   Intake/Output Summary (Last 24 hours) at 10/12/14 1813 Last data filed at 10/12/14 1700  Gross per 24 hour  Intake   1200 ml  Output    500 ml  Net    700 ml   Physical Exam  Constitutional: He is well-developed, well-nourished, and in no distress. No distress.  HENT:  Head: Normocephalic and atraumatic.  Mouth/Throat: Oropharynx is clear and moist.  Cardiovascular: Normal rate. An irregularly irregular rhythm present. PMI is not displaced.  Pulses:  Dorsalis pedis pulses are 2+ on the right side, and 2+ on the left side.   Posterior tibial pulses are 2+ on the right side, and 1+ on the left side.  Pulmonary/Chest: Effort normal. No respiratory distress. He has no wheezes. He has rales (bibasilar).  Abdominal: Soft. Bowel sounds are normal. There is no tenderness. There is no rebound.  Musculoskeletal: He exhibits edema (trace b/l LE edema) Nursing note and vitals reviewed.  Lab Results: Basic Metabolic Panel:  Recent Labs Lab 10/11/14 0524 10/12/14 0241  NA 133* 132*  K 4.3 3.5  CL 96* 91*  CO2 25 31  GLUCOSE 252* 131*  BUN 25* 30*  CREATININE 1.46* 1.32*  CALCIUM 8.4* 8.4*   CBC:  Recent Labs Lab 10/11/14 0524  WBC 11.1*  HGB 10.5*  HCT 37.8*  MCV 69.9*  PLT 313   Cardiac Enzymes:  Recent Labs Lab 10/11/14 1553 10/11/14 2043 10/12/14 0241  TROPONINI 0.08* 0.07* 0.08*   D-Dimer:  Recent Labs Lab 10/11/14 0626  DDIMER 2.63*   CBG:  Recent Labs Lab 10/11/14 1436  10/11/14 1610 10/11/14 2038 10/12/14 0557 10/12/14 1151 10/12/14 1643  GLUCAP 185* 191* 194* 131* 260* 94   Coagulation:  Recent Labs Lab 10/11/14 0524  LABPROT 20.9*  INR 1.81*   Anemia Panel:  Recent Labs Lab 10/12/14 0241  FERRITIN 28  TIBC 406  IRON 16*   Urine Drug Screen: Drugs of Abuse     Component Value Date/Time   LABOPIA NONE DETECTED 10/11/2014 1017   COCAINSCRNUR NONE DETECTED 10/11/2014 1017   LABBENZ NONE DETECTED 10/11/2014 1017   AMPHETMU NONE DETECTED 10/11/2014 1017   THCU NONE DETECTED 10/11/2014 1017   LABBARB NONE DETECTED 10/11/2014 1017     Micro Results: No results found for this or any previous visit (from the past 240 hour(s)). Studies/Results: Dg Chest 2 View  10/11/2014   CLINICAL DATA:  Acute onset of left-sided chest pain and shortness of breath. Initial encounter.  EXAM: CHEST  2 VIEW  COMPARISON:  Chest radiograph performed 09/28/2014  FINDINGS: A small right pleural effusion is noted. Vascular congestion is seen, with mildly increased interstitial markings, possibly reflecting mild interstitial edema. No pneumothorax is identified.  The cardiomediastinal silhouette is enlarged. The patient is status post median sternotomy. An AICD is noted at the left chest wall, with a single lead ending at  the right ventricle. No acute osseous abnormalities are seen.  IMPRESSION: Small right pleural effusion noted. Vascular congestion and cardiomegaly, with increased interstitial markings, likely reflecting mild interstitial edema.   Electronically Signed   By: Roanna Raider M.D.   On: 10/11/2014 05:54   Ct Angio Chest Pe W/cm &/or Wo Cm  10/11/2014   CLINICAL DATA:  Shortness of breath and chest pain  EXAM: CT ANGIOGRAPHY CHEST WITH CONTRAST  TECHNIQUE: Multidetector CT imaging of the chest was performed using the standard protocol during bolus administration of intravenous contrast. Multiplanar CT image reconstructions and MIPs were obtained to  evaluate the vascular anatomy.  CONTRAST:  OMNIPAQUE IOHEXOL 350 MG/ML SOLN  COMPARISON:  Chest radiograph October 11, 2014 ; CT abdomen May 17, 2012  FINDINGS: There is no demonstrable pulmonary embolus. There is atherosclerotic change in the aorta and visualized great vessels. There is no thoracic aortic aneurysm or dissection apparent.  There are bilateral pleural effusions with bibasilar edema. There is patchy atelectasis in both lung bases as well.  Thyroid appears normal. There is no appreciable thoracic adenopathy. There are scattered small mediastinal lymph nodes which do not meet size criteria for pathologic significance.  There is extensive native coronary artery calcification. Patient is status post coronary artery bypass grafting. Pacemaker lead is attached to the right ventricle. The pericardium is not thickened. There is mild cardiomegaly. There is evidence of reflux into the hepatic veins and inferior vena cava suggesting increase in right heart pressure.  In the visualized upper abdomen, there is a persistent hyperplasia of the visualized left adrenal.  There is Degenerative change in the thoracic spine. There are no blastic or lytic bone lesions.  Review of the MIP images confirms the above findings.  IMPRESSION: No demonstrable pulmonary embolus.  Evidence of congestive heart failure.  Stable left adrenal hypertrophy. No adenopathy appreciable. Reflux of contrast into the hepatic veins and inferior vena cava suggests increase in right heart pressure. Multiple areas of atherosclerotic calcification.   Electronically Signed   By: Bretta Bang III M.D.   On: 10/11/2014 08:06   Medications: I have reviewed the patient's current medications. Scheduled Meds: . albuterol  2.5 mg Nebulization Q4H  . aspirin EC  81 mg Oral Daily  . atorvastatin  40 mg Oral q1800  . furosemide  80 mg Intravenous TID  . insulin aspart  0-15 Units Subcutaneous TID WC  . insulin aspart  0-5 Units  Subcutaneous QHS  . metoprolol  75 mg Oral BID  . nicotine  7 mg Transdermal Daily  . rivaroxaban  20 mg Oral QAC supper  . sodium chloride  3 mL Intravenous Q12H  . sodium chloride  3 mL Intravenous Q12H  . spironolactone  25 mg Oral Daily   Continuous Infusions:  PRN Meds:.sodium chloride, acetaminophen **OR** acetaminophen, sodium chloride Assessment/Plan: Principal Problem:   Acute on chronic systolic CHF (congestive heart failure) Active Problems:   Essential hypertension   Noncompliance   CAD- s/p CABG July 2014 [facility]   Microcytic anemia   ICD - in place- BS May 2014 [place]   PVD (peripheral vascular disease)   DM (diabetes mellitus), type 2 with peripheral vascular complications   Atrial fibrillation with controlled ventricular response   Tobacco use disorder   Dyspnea  58 year old homeless male with Hx of sCHF presenting with progressive dyspnea and volume overload.  Acute on chronic systolic CHF (congestive heart failure) - Patient presents with progressive dyspnea, orthopnea, and  evidence of gross volume overload secondary to increased dietary salt intake.he was given Lasix 80mg  TID for diuresis. He has diuresed 1.3 L fluid in 24 hrs. States he is not SOB anymore. Patient had 9 beats of Vtach last night with HR >110 on tele. Vitals stable at present, no tachycardia. O2 sat 98% on RA. - cont IV lasix 80mg  TID - continue Metoprolol Tartrate 75mg  BID - Continue Spironolactone 25mg  QD - Patient is not on ACEi, will not start right now as patient is being aggressively diuresed and just got IV contrast but consider starting later in admission or at follow up.   Essential hypertension BP 108/64 today. Stable.  - Continue metoprolol  - cont Spironolactone  - IF BP will tolerate will try to introduce ACEi but will first follow renal function after contrast study   Noncompliance - Reports no issues with affording medications, has been taking all medications  except 1 missed dose of xarelto.   CAD- s/p CABG July 2014 [facility] No active chest pain, EKG wnl, CE negative. He last saw Dr Ladona Ridgel ([facility]) 09/13/14 - cont ASA 81mg  daily - cont Atorvastatin 40mg  daily   Microcytic anemia - Last anemia panel in 2014 suggests IDA. Current Hgb 10.5. Iron studies show low iron, low saturation ratio, low ferritin, and a high TIBC suggesting iron deficiency anemia.  -started pt on ferrous sulfate 325mg  QD  -started stool softener Colace  -F/u with PCP as outpatient    PVD (peripheral vascular disease) - Patient with palpable pulses in bilateral lower extremities. Has follow up with vascular surgery with imaging studies ordered. - Start ASA and Lipitor as above   DM (diabetes mellitus), type 2 with peripheral vascular complications - Last A1c 8.4 in May - Hold Glipizide - Start SSI-M   Atrial fibrillation with controlled ventricular response - Continue metoprolol - Resume Xarelto   Tobacco use disorder - Nicotine patch ordered   Prolonged QT on ECG - Has ICD in place.  Diet: Heart/Carb mod DVT: Xarelto Code: Full   Dispo: Disposition is deferred at this time, awaiting improvement of current medical problems.  Anticipated discharge in approximately 1-2 day(s).   The patient does have a current PCP ([name], MD) and does need an [facility] hospital follow-up appointment after discharge.  The patient does not have transportation limitations that hinder transportation to clinic appointments.  .Services Needed at time of discharge: Y = Yes, Blank = No PT:   OT:   RN:   Equipment:   Other:     LOS: 1 day   John Giovanni, MD 10/12/2014, 6:13 PM

## 2014-10-13 ENCOUNTER — Encounter: Payer: Self-pay | Admitting: Surgery

## 2014-10-13 LAB — BASIC METABOLIC PANEL
Anion gap: 12 (ref 5–15)
BUN: 36 mg/dL — ABNORMAL HIGH (ref 6–20)
CO2: 27 mmol/L (ref 22–32)
Calcium: 8.6 mg/dL — ABNORMAL LOW (ref 8.9–10.3)
Chloride: 95 mmol/L — ABNORMAL LOW (ref 101–111)
Creatinine, Ser: 1.35 mg/dL — ABNORMAL HIGH (ref 0.61–1.24)
GFR, EST NON AFRICAN AMERICAN: 57 mL/min — AB (ref >60)
Glucose, Bld: 120 mg/dL — ABNORMAL HIGH (ref 65–99)
POTASSIUM: 4.9 mmol/L (ref 3.5–5.1)
Sodium: 134 mmol/L — ABNORMAL LOW (ref 135–145)

## 2014-10-13 LAB — GLUCOSE, CAPILLARY: GLUCOSE-CAPILLARY: 212 mg/dL — AB (ref 65–99)

## 2014-10-13 MED ORDER — DOCUSATE SODIUM 100 MG PO CAPS: 100.0000 mg | ORAL_CAPSULE | Freq: Every day | ORAL | Status: DC

## 2014-10-13 MED ORDER — RIVAROXABAN 20 MG PO TABS: 20.0000 mg | ORAL_TABLET | Freq: Every day | ORAL | Status: DC

## 2014-10-13 MED ORDER — FERROUS SULFATE 325 (65 FE) MG PO TABS: 325.0000 mg | ORAL_TABLET | Freq: Three times a day (TID) | ORAL | Status: DC

## 2014-10-13 MED ORDER — ATORVASTATIN CALCIUM 40 MG PO TABS: 40.0000 mg | ORAL_TABLET | Freq: Every day | ORAL | Status: DC

## 2014-10-13 MED ORDER — ALBUTEROL SULFATE (2.5 MG/3ML) 0.083% IN NEBU
2.5000 mg | INHALATION_SOLUTION | Freq: Two times a day (BID) | RESPIRATORY_TRACT | Status: DC
  Administered 2014-10-13: 2.5 mg via RESPIRATORY_TRACT
  Filled 2014-10-13: qty 3

## 2014-10-13 MED ORDER — FUROSEMIDE 80 MG PO TABS
80.0000 mg | ORAL_TABLET | Freq: Two times a day (BID) | ORAL | Status: DC
  Filled 2014-10-13: qty 1

## 2014-10-13 MED ORDER — ASPIRIN 81 MG PO TBEC: 81.0000 mg | DELAYED_RELEASE_TABLET | Freq: Every day | ORAL | Status: DC

## 2014-10-13 NOTE — Discharge Summary (Signed)
Name: Jacob [name] MRN: [id number] DOB: 03-13-1956 59 y.o. PCP: Jacob Angst, MD  Date of Admission: 10/11/2014  4:57 AM Date of Discharge: 10/13/2014 Attending Physician: Earl Lagos, MD  Discharge Diagnosis:  Principal Problem:   Acute on chronic systolic CHF (congestive heart failure) Active Problems:   Essential hypertension   Noncompliance   CAD- s/p CABG July 2014 [facility]   Microcytic anemia   ICD - in place- BS May 2014 [place]   PVD (peripheral vascular disease)   DM (diabetes mellitus), type 2 with peripheral vascular complications   Atrial fibrillation with controlled ventricular response   Tobacco use disorder   Dyspnea  Discharge Medications:   Medication List    TAKE these medications        albuterol (2.5 MG/3ML) 0.083% nebulizer solution  Commonly known as:  PROVENTIL  Take 3 mLs (2.5 mg total) by nebulization every 6 (six) hours as needed for wheezing or shortness of breath.     albuterol 108 (90 BASE) MCG/ACT inhaler  Commonly known as:  PROVENTIL HFA;VENTOLIN HFA  Inhale 2 puffs into the lungs every 6 (six) hours as needed for wheezing or shortness of breath.     aspirin 81 MG EC tablet  Take 1 tablet (81 mg total) by mouth daily.     atorvastatin 40 MG tablet  Commonly known as:  LIPITOR  Take 1 tablet (40 mg total) by mouth daily at 6 PM.     docusate sodium 100 MG capsule  Commonly known as:  COLACE  Take 1 capsule (100 mg total) by mouth daily.     ferrous sulfate 325 (65 FE) MG tablet  Take 1 tablet (325 mg total) by mouth 3 (three) times daily with meals.     furosemide 40 MG tablet  Commonly known as:  LASIX  3 tabs (120mg ) in the morning and 2 tabs (80mg ) in the evening     glipiZIDE 5 MG tablet  Commonly known as:  GLUCOTROL  Take 1 tablet (5 mg total) by mouth 2 (two) times daily before a meal.     Metoprolol Tartrate 75 MG Tabs  Take 75 mg by mouth 2 (two) times daily.     nicotine 7 mg/24hr patch    Commonly known as:  NICODERM CQ  Place 1 patch (7 mg total) onto the skin daily.     rivaroxaban 20 MG Tabs tablet  Commonly known as:  XARELTO  Take 1 tablet (20 mg total) by mouth daily before supper.     spironolactone 25 MG tablet  Commonly known as:  ALDACTONE  Take 1 tablet (25 mg total) by mouth daily.     traMADol 50 MG tablet  Commonly known as:  ULTRAM  Take 1 tablet (50 mg total) by mouth every 8 (eight) hours as needed.        Disposition and follow-up:   [name] was discharged from [facility] in Stable condition.  At the hospital follow up visit please address:  1.  Acute on chronic systolic CHF (congestive heart failure) Did he loose weight since discharge? Any leg swelling or SOB? Is he compliant with Lasix? Does his diet still comprise of salty foods? Pt is currently not on an ACEi, please consider adding it to his medication regimen due to mortality benefit.   Essential hypertension Consider adding ACEi to regimen after checking pts renal function.   CAD- s/p CABG July 2014 [facility] Patient had a run  of NSVT during this hospitalization. He has an AICD in place. Also had mildly elevated troponins with no acute EKG changes - likely demand ischemia.  Any episodes of CP, SOB, diaphoresis, or syncope? Any palpitations? IF so, consider ordering a repeat EKG.   Microcytic anemia Pt is 59 yrs old and has never had a colonoscopy done.  2.  Labs / imaging needed at time of follow-up: BMP, Colonoscopy  3.  Pending labs/ test needing follow-up: None  Follow-up Appointments:     Follow-up Information    Follow up with [facility]     On 10/19/2014.   Why:  Hospital follow-up appt on 10/19/14 at 12:00 pm with Dr. Venetia Night.   Contact information:   [address] [place] [address] [phone]      Discharge Instructions: Discharge Instructions    Diet - low sodium heart healthy     Complete by:  As directed            Consultations:    Procedures Performed:  Dg Chest 2 View  10/11/2014   CLINICAL DATA:  Acute onset of left-sided chest pain and shortness of breath. Initial encounter.  EXAM: CHEST  2 VIEW  COMPARISON:  Chest radiograph performed 09/28/2014  FINDINGS: A small right pleural effusion is noted. Vascular congestion is seen, with mildly increased interstitial markings, possibly reflecting mild interstitial edema. No pneumothorax is identified.  The cardiomediastinal silhouette is enlarged. The patient is status post median sternotomy. An AICD is noted at the left chest wall, with a single lead ending at the right ventricle. No acute osseous abnormalities are seen.  IMPRESSION: Small right pleural effusion noted. Vascular congestion and cardiomegaly, with increased interstitial markings, likely reflecting mild interstitial edema.   Electronically Signed   By: Roanna Raider M.D.   On: 10/11/2014 05:54   Dg Chest 2 View  09/28/2014   CLINICAL DATA:  Acute onset of shortness of breath and generalized chest pain. Tachycardia. Initial encounter.  EXAM: CHEST  2 VIEW  COMPARISON:  Chest radiograph performed 09/21/2014  FINDINGS: The lungs are well-aerated. A small right pleural effusion is noted. Bilateral airspace opacification likely reflects pulmonary edema. Underlying vascular congestion is seen. No pneumothorax is identified.  The heart is mildly enlarged. The patient is status post median sternotomy. An AICD is noted at the left chest wall, with a single lead ending at the right ventricle. No acute osseous abnormalities are seen.  IMPRESSION: Small right pleural effusion noted. Bilateral airspace opacification likely reflects pulmonary edema. Underlying vascular congestion and mild cardiomegaly noted.   Electronically Signed   By: Roanna Raider M.D.   On: 09/28/2014 20:09   Dg Chest 2 View  09/21/2014   CLINICAL DATA:  Cough and dyspnea.  Left-sided chest pain.  EXAM:  CHEST  2 VIEW  COMPARISON:  08/18/2014  FINDINGS: Small right pleural effusion. Trace left pleural effusion. Bilateral diffuse interstitial thickening. No pneumothorax. Mild stable cardiomegaly. Single lead cardiac pacer. Prior CABG. No acute osseous abnormality.  IMPRESSION: Findings most consistent with mild CHF.   Electronically Signed   By: Elige Ko   On: 09/21/2014 12:45   Ct Angio Chest Pe W/cm &/or Wo Cm  10/11/2014   CLINICAL DATA:  Shortness of breath and chest pain  EXAM: CT ANGIOGRAPHY CHEST WITH CONTRAST  TECHNIQUE: Multidetector CT imaging of the chest was performed using the standard protocol during bolus administration of intravenous contrast. Multiplanar CT image reconstructions and MIPs  were obtained to evaluate the vascular anatomy.  CONTRAST:  OMNIPAQUE IOHEXOL 350 MG/ML SOLN  COMPARISON:  Chest radiograph October 11, 2014 ; CT abdomen May 17, 2012  FINDINGS: There is no demonstrable pulmonary embolus. There is atherosclerotic change in the aorta and visualized great vessels. There is no thoracic aortic aneurysm or dissection apparent.  There are bilateral pleural effusions with bibasilar edema. There is patchy atelectasis in both lung bases as well.  Thyroid appears normal. There is no appreciable thoracic adenopathy. There are scattered small mediastinal lymph nodes which do not meet size criteria for pathologic significance.  There is extensive native coronary artery calcification. Patient is status post coronary artery bypass grafting. Pacemaker lead is attached to the right ventricle. The pericardium is not thickened. There is mild cardiomegaly. There is evidence of reflux into the hepatic veins and inferior vena cava suggesting increase in right heart pressure.  In the visualized upper abdomen, there is a persistent hyperplasia of the visualized left adrenal.  There is Degenerative change in the thoracic spine. There are no blastic or lytic bone lesions.  Review of the MIP  images confirms the above findings.  IMPRESSION: No demonstrable pulmonary embolus.  Evidence of congestive heart failure.  Stable left adrenal hypertrophy. No adenopathy appreciable. Reflux of contrast into the hepatic veins and inferior vena cava suggests increase in right heart pressure. Multiple areas of atherosclerotic calcification.   Electronically Signed   By: Bretta Bang III M.D.   On: 10/11/2014 08:06    Admission HPI: Jacob [name] is a 59 yo homeless male with PMH of Afib, sCHF (EF 20-25%), CAD s/p CABG (2014), HTN, T2DM w/ PVD. Who presents to the ED with CC of dyspnea. He reports 1-2 days of progressive dyspnea, orthopnea, and lower extremity swelling. He notes that he has been taking his medications including metoprolol, lasix, spironolactone, albuterol (PRN), and glipizide but did miss one dose of his Xarelto yesterday as he ran out. He does report that he is able to get his medications without difficulty from the [facility] and does not have problems taking his medications but admits that his homelessness makes compliance more challenging. He does report excessive salt intake, he denies adding salt to foods but reports an almost exclusive diet of fast food. He reports that he does not want to return to a homeless shelter as he does not use drugs or drink ETOH and reports that the homeless shelters cater to that behavior. Apparently a [facility] program previously helped him but he notes he has been homeless for about the past 5 months. He does occasionally stay with friends and was recently doing well staying in a friend's car but it has been too hot recently to do that.  Hospital Course by problem list: Principal Problem:   Acute on chronic systolic CHF (congestive heart failure) Active Problems:   Essential hypertension   Noncompliance   CAD- s/p CABG July 2014 [facility]   Microcytic anemia   ICD - in place- BS May 2014 [place]   PVD (peripheral vascular disease)   DM  (diabetes mellitus), type 2 with peripheral vascular complications   Atrial fibrillation with controlled ventricular response   Tobacco use disorder   Dyspnea   1. Acute on chronic systolic CHF (congestive heart failure) Patient presents with progressive dyspnea, orthopnea, and evidence of gross volume overload secondary to increased dietary salt intake. He does admit significant dietary indiscretions as he reports most of his meals are "fast food."We suspect  this is the likely cause of his acute exacerbation. Pt was admitted to telemetry and started on IV Lasix 80mg  TID. In addition, we continued his home meds Metoprolol and Spironolactone. Patient was not on an ACEi at home and we decided not to start one as patient was being aggressively diuresed. In the emergency room, he received IV contrast needed for CTA to r/o PE and has h/o CKD. His creatinine was closely monitored to look for development of contrast induced nephropathy. Cr 1.35 on 7/21. He was not on ASA at home despite ischemic cardiomyopathy and we started him on it. He diuresed well, lost 15 lbs since date of admission. He was transitioned from IV to his home dose of oral lasix 120mg  QAM and 80mg  QPM on 7/21, which he is to continue as outpatient. Pt to f/u with [facility] on 7/27, will need to start ACEi as outpatient.   Of note: Patient is homeless and decided to return back to the streets after discharge. Lupita Leash from social work and our team spoke to him and he refused to go to a shelter. Social work has referred him to [facility].    Essential hypertension Continued home meds metoprolol 75mg  BID and Spironolactone 25mg  daily. Held adding ACEi to regimen in the setting of aggressive diuresis.    Noncompliance Reports no issues with affording medications, has been taking all medications except 1 missed dose of xarelto.  CAD- s/p CABG July 2014 [facility] No active chest pain, EKG  wnl, CE negative. He last saw Dr Ladona Ridgel ([facility]) 09/13/14. Pt started on ASA 81mg  daily and high intensity statin: Atorvastatin 40mg  daily. Patient had a run of NSVT early morning 7/20. He has an AICD in place. We monitored him on telemetry and continued with metoprolol. He also had mildly elevated troponins with no acute EKG changes - likely demand ischemia.   Microcytic anemia Last anemia panel in 2014 suggests IDA. Hgb 10.5. Iron studies showed low iron, low saturation ratio, low ferritin, and a high TIBC suggesting iron deficiency anemia. He was started on ferrous sulfate 325mg  QD + colace. He is to f/u with PCP on 7/27. 59 yrs old and never had a colonoscopy. He needs an outpatient colonoscopy for workup of IDA.  PVD (peripheral vascular disease) Patient with palpable pulses in bilateral lower extremities. Has follow up with vascular surgery with imaging studies ordered. Started ASA and Lipitor as above  DM (diabetes mellitus), type 2 with peripheral vascular complications Last A1c 8.4 in May. Held Glipizide and started SSI-M. F/u with PCP on 7/27.   Atrial fibrillation with controlled ventricular response Continued home med metoprolol and resumed home med Xarelto  Tobacco use disorder - Nicotine patch ordered  Prolonged QT on ECG - Has ICD in place.  Discharge Vitals:   BP 100/63 mmHg  Pulse 96  Temp(Src) 98.2 F (36.8 C) (Oral)  Resp 18  Ht 5\' 4"  (1.626 m)  Wt 216 lb 9.6 oz (98.249 kg)  BMI 37.16 kg/m2  SpO2 100%  Discharge Labs:  Results for orders placed or performed during the hospital encounter of 10/11/14 (from the past 24 hour(s))  Glucose, capillary     Status: None   Collection Time: 10/12/14  4:43 PM  Result Value Ref Range   Glucose-Capillary 94 65 - 99 mg/dL   Comment 1 Notify RN    Comment 2 Document in Chart   Glucose, capillary     Status: Abnormal   Collection  Time: 10/12/14  8:54 PM  Result Value Ref Range   Glucose-Capillary 261 (H) 65 - 99 mg/dL    Comment 1 Notify RN    Comment 2 Document in Chart   Basic metabolic panel     Status: Abnormal   Collection Time: 10/13/14  4:10 AM  Result Value Ref Range   Sodium 134 (L) 135 - 145 mmol/L   Potassium 4.9 3.5 - 5.1 mmol/L   Chloride 95 (L) 101 - 111 mmol/L   CO2 27 22 - 32 mmol/L   Glucose, Bld 120 (H) 65 - 99 mg/dL   BUN 36 (H) 6 - 20 mg/dL   Creatinine, Ser 9.60 (H) 0.61 - 1.24 mg/dL   Calcium 8.6 (L) 8.9 - 10.3 mg/dL   GFR calc non Af Amer 57 (L) >60 mL/min   GFR calc Af Amer >60 >60 mL/min   Anion gap 12 5 - 15  Glucose, capillary     Status: Abnormal   Collection Time: 10/13/14 11:05 AM  Result Value Ref Range   Glucose-Capillary 212 (H) 65 - 99 mg/dL   Comment 1 Notify RN     Signed: John Giovanni, MD 10/13/2014, 1:06 PM    Services Ordered on Discharge: None Equipment Ordered on Discharge: None

## 2014-10-13 NOTE — Progress Notes (Signed)
Subjective: Pt seen and examined today. Denies having SOB or cough. States his legs are not swollen anymore. No other complaints.   Objective: Vital signs in last 24 hours: Filed Vitals:   10/12/14 1317 10/12/14 1618 10/12/14 2058 10/13/14 0600  BP: 108/64  129/81   Pulse: 96  71   Temp: 97.6 F (36.4 C)  98.2 F (36.8 C)   TempSrc: Oral  Oral   Resp: 20  18   Height:      Weight:    216 lb 9.6 oz (98.249 kg)  SpO2: 100% 98% 100%    Weight change: -14 lb 14.4 oz (-6.759 kg)  Intake/Output Summary (Last 24 hours) at 10/13/14 6389 Last data filed at 10/13/14 0600  Gross per 24 hour  Intake    960 ml  Output      0 ml  Net    960 ml   Physical Exam  Constitutional: He is well-developed, well-nourished, and in no distress. No distress.  HENT:  Head: Normocephalic and atraumatic.  Mouth/Throat: Oropharynx is clear and moist.  Cardiovascular: Normal rate. An irregularly irregular rhythm present. PMI is not displaced.  Pulses:  Dorsalis pedis pulses are 2+ on the right side, and 2+ on the left side.   Posterior tibial pulses are 2+ on the right side, and 1+ on the left side.  Pulmonary/Chest: Effort normal. No respiratory distress. He has no wheezes. He has rales (bibasilar).  Abdominal: Soft. Bowel sounds are normal. There is no tenderness. There is no rebound.  Musculoskeletal: He exhibits edema (trace b/l LE edema) Nursing note and vitals reviewed  Lab Results: Basic Metabolic Panel:  Recent Labs Lab 10/12/14 0241 10/13/14 0410  NA 132* 134*  K 3.5 4.9  CL 91* 95*  CO2 31 27  GLUCOSE 131* 120*  BUN 30* 36*  CREATININE 1.32* 1.35*  CALCIUM 8.4* 8.6*   Liver Function Tests: No results for input(s): AST, ALT, ALKPHOS, BILITOT, PROT, ALBUMIN in the last 168 hours. No results for input(s): LIPASE, AMYLASE in the last 168 hours. No results for input(s): AMMONIA in the last 168 hours. CBC:  Recent Labs Lab 10/11/14 0524  WBC 11.1*  HGB  10.5*  HCT 37.8*  MCV 69.9*  PLT 313   Cardiac Enzymes:  Recent Labs Lab 10/11/14 1553 10/11/14 2043 10/12/14 0241  TROPONINI 0.08* 0.07* 0.08*   BNP: No results for input(s): PROBNP in the last 168 hours. D-Dimer:  Recent Labs Lab 10/11/14 0626  DDIMER 2.63*   CBG:  Recent Labs Lab 10/11/14 1610 10/11/14 2038 10/12/14 0557 10/12/14 1151 10/12/14 1643 10/12/14 2054  GLUCAP 191* 194* 131* 260* 94 261*   Hemoglobin A1C: No results for input(s): HGBA1C in the last 168 hours. Fasting Lipid Panel: No results for input(s): CHOL, HDL, LDLCALC, TRIG, CHOLHDL, LDLDIRECT in the last 168 hours. Thyroid Function Tests: No results for input(s): TSH, T4TOTAL, FREET4, T3FREE, THYROIDAB in the last 168 hours. Coagulation:  Recent Labs Lab 10/11/14 0524  LABPROT 20.9*  INR 1.81*   Anemia Panel:  Recent Labs Lab 10/12/14 0241  FERRITIN 28  TIBC 406  IRON 16*   Urine Drug Screen: Drugs of Abuse     Component Value Date/Time   LABOPIA NONE DETECTED 10/11/2014 1017   COCAINSCRNUR NONE DETECTED 10/11/2014 1017   LABBENZ NONE DETECTED 10/11/2014 1017   AMPHETMU NONE DETECTED 10/11/2014 1017   THCU NONE DETECTED 10/11/2014 1017   LABBARB NONE DETECTED 10/11/2014 1017    Alcohol Level: No  results for input(s): ETH in the last 168 hours. Urinalysis: No results for input(s): COLORURINE, LABSPEC, PHURINE, GLUCOSEU, HGBUR, BILIRUBINUR, KETONESUR, PROTEINUR, UROBILINOGEN, NITRITE, LEUKOCYTESUR in the last 168 hours.  Invalid input(s): APPERANCEUR Misc. Labs:   Micro Results: No results found for this or any previous visit (from the past 240 hour(s)). Studies/Results: Ct Angio Chest Pe W/cm &/or Wo Cm  10/11/2014   CLINICAL DATA:  Shortness of breath and chest pain  EXAM: CT ANGIOGRAPHY CHEST WITH CONTRAST  TECHNIQUE: Multidetector CT imaging of the chest was performed using the standard protocol during bolus administration of intravenous contrast. Multiplanar CT  image reconstructions and MIPs were obtained to evaluate the vascular anatomy.  CONTRAST:  OMNIPAQUE IOHEXOL 350 MG/ML SOLN  COMPARISON:  Chest radiograph October 11, 2014 ; CT abdomen May 17, 2012  FINDINGS: There is no demonstrable pulmonary embolus. There is atherosclerotic change in the aorta and visualized great vessels. There is no thoracic aortic aneurysm or dissection apparent.  There are bilateral pleural effusions with bibasilar edema. There is patchy atelectasis in both lung bases as well.  Thyroid appears normal. There is no appreciable thoracic adenopathy. There are scattered small mediastinal lymph nodes which do not meet size criteria for pathologic significance.  There is extensive native coronary artery calcification. Patient is status post coronary artery bypass grafting. Pacemaker lead is attached to the right ventricle. The pericardium is not thickened. There is mild cardiomegaly. There is evidence of reflux into the hepatic veins and inferior vena cava suggesting increase in right heart pressure.  In the visualized upper abdomen, there is a persistent hyperplasia of the visualized left adrenal.  There is Degenerative change in the thoracic spine. There are no blastic or lytic bone lesions.  Review of the MIP images confirms the above findings.  IMPRESSION: No demonstrable pulmonary embolus.  Evidence of congestive heart failure.  Stable left adrenal hypertrophy. No adenopathy appreciable. Reflux of contrast into the hepatic veins and inferior vena cava suggests increase in right heart pressure. Multiple areas of atherosclerotic calcification.   Electronically Signed   By: Bretta Bang III M.D.   On: 10/11/2014 08:06   Medications: I have reviewed the patient's current medications. Scheduled Meds: . albuterol  2.5 mg Nebulization BID  . aspirin EC  81 mg Oral Daily  . atorvastatin  40 mg Oral q1800  . docusate sodium  100 mg Oral Daily  . ferrous sulfate  325 mg Oral Q  breakfast  . furosemide  80 mg Intravenous TID  . insulin aspart  0-15 Units Subcutaneous TID WC  . insulin aspart  0-5 Units Subcutaneous QHS  . metoprolol  75 mg Oral BID  . nicotine  7 mg Transdermal Daily  . rivaroxaban  20 mg Oral QAC supper  . sodium chloride  3 mL Intravenous Q12H  . sodium chloride  3 mL Intravenous Q12H  . spironolactone  25 mg Oral Daily   Continuous Infusions:  PRN Meds:.sodium chloride, acetaminophen **OR** acetaminophen, sodium chloride Assessment/Plan: Principal Problem:   Acute on chronic systolic CHF (congestive heart failure) Active Problems:   Essential hypertension   Noncompliance   CAD- s/p CABG July 2014 [facility]   Microcytic anemia   ICD - in place- BS May 2014 [place]   PVD (peripheral vascular disease)   DM (diabetes mellitus), type 2 with peripheral vascular complications   Atrial fibrillation with controlled ventricular response   Tobacco use disorder   Dyspnea  59 year old homeless male with  Hx of sCHF presenting with progressive dyspnea and volume overload.  Acute on chronic systolic CHF (congestive heart failure) - Patient presents with progressive dyspnea, orthopnea, and evidence of gross volume overload secondary to increased dietary salt intake.He was given Lasix 80mg  TID for diuresis. He has been diuresing well, lost 16 lbs weight since admission. States he is not SOB anymore. Vitals stable at present, no tachycardia. O2 sat 98-100% on RA. - patient will be discharged today. Discontinue IV lasix. Pt will continue home dose of oral Lasix 120mg  QAM and 80mg  QPM - continue Metoprolol Tartrate 75mg  BID - Continue Spironolactone 25mg  QD - Patient is not on ACEi, will not start right now as patient is being aggressively diuresed and received IV contrast on this admission. Cr 1.35 today.    Essential hypertension BP stable today. - Continue metoprolol  - cont Spironolactone  - IF BP increases will try to introduce ACEi  but will first follow renal function after contrast study   Noncompliance - Reports no issues with affording medications, has been taking all medications except 1 missed dose of xarelto.   CAD- s/p CABG July 2014 [facility] No active chest pain, EKG wnl, CE negative. He last saw Dr Ladona Ridgel (Cards) 09/13/14 - cont ASA 81mg  daily - cont Atorvastatin 40mg  daily   Microcytic anemia - Last anemia panel in 2014 suggests IDA. Current Hgb 10.5. Iron studies show low iron, low saturation ratio, low ferritin, and a high TIBC suggesting iron deficiency anemia.  -started pt on ferrous sulfate 325mg  QD  -started stool softener Colace  -F/u with PCP for outpatient colonoscopy for workup of iron deficiency anemia    PVD (peripheral vascular disease) - Patient with palpable pulses in bilateral lower extremities. Has follow up with vascular surgery with imaging studies ordered. - Start ASA and Lipitor as above   DM (diabetes mellitus), type 2 with peripheral vascular complications - Last A1c 8.4 in May - Hold Glipizide - Start SSI-M   Atrial fibrillation with controlled ventricular response - Continue metoprolol - Resume Xarelto   Tobacco use disorder - Nicotine patch ordered   Prolonged QT on ECG - Has ICD in place.  Diet: Heart/Carb mod DVT: Xarelto Code: Full  Dispo: Disposition is deferred at this time, awaiting improvement of current medical problems.  Anticipated discharge in approximately 0 day(s).   The patient does have a current PCP ([name], MD) and does need an [facility] hospital follow-up appointment after discharge.  The patient does not have transportation limitations that hinder transportation to clinic appointments.  .Services Needed at time of discharge: Y = Yes, Blank = No PT:   OT:   RN:   Equipment:   Other:     LOS: 2 days   John Giovanni, MD 10/13/2014, 7:28 AM

## 2014-10-13 NOTE — Discharge Instructions (Signed)
1. You have a follow up appointment as follows:  [facility]  On 10/19/2014 Hospital follow-up appt on 10/19/14 at 12:00 pm with Dr. Venetia Night.  [address] [name] [id number] [phone]  2. Please take all medications as previously prescribed with the following changes:  Continue Lasix 120 mg in AM, 80 mg in PM.    Start taking Iron Sulfate 325 mg three times daily. This can make you constipated. You can take Colace as needed for this.   I refilled your Xarelto. You can pick up your prescription at [facility].   You will need a colonoscopy as an outpatient, please discuss this with your doctor.   3. If you have worsening of your symptoms or new symptoms arise, please call the clinic ([phone]), or go to the ER immediately if symptoms are severe.  Heart Failure Heart failure is a condition in which the heart has trouble pumping blood. This means your heart does not pump blood efficiently for your body to work well. In some cases of heart failure, fluid may back up into your lungs or you may have swelling (edema) in your lower legs. Heart failure is usually a long-term (chronic) condition. It is important for you to take good care of yourself and follow your health care provider's treatment plan. CAUSES  Some health conditions can cause heart failure. Those health conditions include:  High blood pressure (hypertension). Hypertension causes the heart muscle to work harder than normal. When pressure in the blood vessels is high, the heart needs to pump (contract) with more force in order to circulate blood throughout the body. High blood pressure eventually causes the heart to become stiff and weak.  Coronary artery disease (CAD). CAD is the buildup of cholesterol and fat (plaque) in the arteries of the heart. The blockage in the arteries deprives the heart muscle of oxygen and blood. This can cause chest pain and may lead to a  heart attack. High blood pressure can also contribute to CAD.  Heart attack (myocardial infarction). A heart attack occurs when one or more arteries in the heart become blocked. The loss of oxygen damages the muscle tissue of the heart. When this happens, part of the heart muscle dies. The injured tissue does not contract as well and weakens the heart's ability to pump blood.  Abnormal heart valves. When the heart valves do not open and close properly, it can cause heart failure. This makes the heart muscle pump harder to keep the blood flowing.  Heart muscle disease (cardiomyopathy or myocarditis). Heart muscle disease is damage to the heart muscle from a variety of causes. These can include drug or alcohol abuse, infections, or unknown reasons. These can increase the risk of heart failure.  Lung disease. Lung disease makes the heart work harder because the lungs do not work properly. This can cause a strain on the heart, leading it to fail.  Diabetes. Diabetes increases the risk of heart failure. High blood sugar contributes to high fat (lipid) levels in the blood. Diabetes can also cause slow damage to tiny blood vessels that carry important nutrients to the heart muscle. When the heart does not get enough oxygen and food, it can cause the heart to become weak and stiff. This leads to a heart that does not contract efficiently.  Other conditions can contribute to heart failure. These include abnormal heart rhythms, thyroid problems, and low blood counts (anemia). Certain unhealthy behaviors can increase  the risk of heart failure, including:  Being overweight.  Smoking or chewing tobacco.  Eating foods high in fat and cholesterol.  Abusing illicit drugs or alcohol.  Lacking physical activity. SYMPTOMS  Heart failure symptoms may vary and can be hard to detect. Symptoms may include:  Shortness of breath with activity, such as climbing stairs.  Persistent cough.  Swelling of the feet,  ankles, legs, or abdomen.  Unexplained weight gain.  Difficulty breathing when lying flat (orthopnea).  Waking from sleep because of the need to sit up and get more air.  Rapid heartbeat.  Fatigue and loss of energy.  Feeling light-headed, dizzy, or close to fainting.  Loss of appetite.  Nausea.  Increased urination during the night (nocturia). DIAGNOSIS  A diagnosis of heart failure is based on your history, symptoms, physical examination, and diagnostic tests. Diagnostic tests for heart failure may include:  Echocardiography.  Electrocardiography.  Chest X-ray.  Blood tests.  Exercise stress test.  Cardiac angiography.  Radionuclide scans. TREATMENT  Treatment is aimed at managing the symptoms of heart failure. Medicines, behavioral changes, or surgical intervention may be necessary to treat heart failure.  Medicines to help treat heart failure may include:  Angiotensin-converting enzyme (ACE) inhibitors. This type of medicine blocks the effects of a blood protein called angiotensin-converting enzyme. ACE inhibitors relax (dilate) the blood vessels and help lower blood pressure.  Angiotensin receptor blockers (ARBs). This type of medicine blocks the actions of a blood protein called angiotensin. Angiotensin receptor blockers dilate the blood vessels and help lower blood pressure.  Water pills (diuretics). Diuretics cause the kidneys to remove salt and water from the blood. The extra fluid is removed through urination. This loss of extra fluid lowers the volume of blood the heart pumps.  Beta blockers. These prevent the heart from beating too fast and improve heart muscle strength.  Digitalis. This increases the force of the heartbeat.  Healthy behavior changes include:  Obtaining and maintaining a healthy weight.  Stopping smoking or chewing tobacco.  Eating heart-healthy foods.  Limiting or avoiding alcohol.  Stopping illicit drug use.  Physical  activity as directed by your health care provider.  Surgical treatment for heart failure may include:  A procedure to open blocked arteries, repair damaged heart valves, or remove damaged heart muscle tissue.  A pacemaker to improve heart muscle function and control certain abnormal heart rhythms.  An internal cardioverter defibrillator to treat certain serious abnormal heart rhythms.  A left ventricular assist device (LVAD) to assist the pumping ability of the heart. HOME CARE INSTRUCTIONS   Take medicines only as directed by your health care provider. Medicines are important in reducing the workload of your heart, slowing the progression of heart failure, and improving your symptoms.  Do not stop taking your medicine unless directed by your health care provider.  Do not skip any dose of medicine.  Refill your prescriptions before you run out of medicine. Your medicines are needed every day.  Engage in moderate physical activity if directed by your health care provider. Moderate physical activity can benefit some people. The elderly and people with severe heart failure should consult with a health care provider for physical activity recommendations.  Eat heart-healthy foods. Food choices should be free of trans fat and low in saturated fat, cholesterol, and salt (sodium). Healthy choices include fresh or frozen fruits and vegetables, fish, lean meats, legumes, fat-free or low-fat dairy products, and whole grain or high fiber foods. Talk to a dietitian to  learn more about heart-healthy foods.  Limit sodium if directed by your health care provider. Sodium restriction may reduce symptoms of heart failure in some people. Talk to a dietitian to learn more about heart-healthy seasonings.  Use healthy cooking methods. Healthy cooking methods include roasting, grilling, broiling, baking, poaching, steaming, or stir-frying. Talk to a dietitian to learn more about healthy cooking methods.  Limit  fluids if directed by your health care provider. Fluid restriction may reduce symptoms of heart failure in some people.  Weigh yourself every day. Daily weights are important in the early recognition of excess fluid. You should weigh yourself every morning after you urinate and before you eat breakfast. Wear the same amount of clothing each time you weigh yourself. Record your daily weight. Provide your health care provider with your weight record.  Monitor and record your blood pressure if directed by your health care provider.  Check your pulse if directed by your health care provider.  Lose weight if directed by your health care provider. Weight loss may reduce symptoms of heart failure in some people.  Stop smoking or chewing tobacco. Nicotine makes your heart work harder by causing your blood vessels to constrict. Do not use nicotine gum or patches before talking to your health care provider.  Keep all follow-up visits as directed by your health care provider. This is important.  Limit alcohol intake to no more than 1 drink per day for nonpregnant women and 2 drinks per day for men. One drink equals 12 ounces of beer, 5 ounces of wine, or 1 ounces of hard liquor. Drinking more than that is harmful to your heart. Tell your health care provider if you drink alcohol several times a week. Talk with your health care provider about whether alcohol is safe for you. If your heart has already been damaged by alcohol or you have severe heart failure, drinking alcohol should be stopped completely.  Stop illicit drug use.  Stay up-to-date with immunizations. It is especially important to prevent respiratory infections through current pneumococcal and influenza immunizations.  Manage other health conditions such as hypertension, diabetes, thyroid disease, or abnormal heart rhythms as directed by your health care provider.  Learn to manage stress.  Plan rest periods when fatigued.  Learn strategies  to manage high temperatures. If the weather is extremely hot:  Avoid vigorous physical activity.  Use air conditioning or fans or seek a cooler location.  Avoid caffeine and alcohol.  Wear loose-fitting, lightweight, and light-colored clothing.  Learn strategies to manage cold temperatures. If the weather is extremely cold:  Avoid vigorous physical activity.  Layer clothes.  Wear mittens or gloves, a hat, and a scarf when going outside.  Avoid alcohol.  Obtain ongoing education and support as needed.  Participate in or seek rehabilitation as needed to maintain or improve independence and quality of life. SEEK MEDICAL CARE IF:   Your weight increases by 03 lb/1.4 kg in 1 day or 05 lb/2.3 kg in a week.  You have increasing shortness of breath that is unusual for you.  You are unable to participate in your usual physical activities.  You tire easily.  You cough more than normal, especially with physical activity.  You have any or more swelling in areas such as your hands, feet, ankles, or abdomen.  You are unable to sleep because it is hard to breathe.  You feel like your heart is beating fast (palpitations).  You become dizzy or light-headed upon standing up. SEEK IMMEDIATE  MEDICAL CARE IF:   You have difficulty breathing.  There is a change in mental status such as decreased alertness or difficulty with concentration.  You have a pain or discomfort in your chest.  You have an episode of fainting (syncope). MAKE SURE YOU:   Understand these instructions.  Will watch your condition.  Will get help right away if you are not doing well or get worse. Document Released: 03/11/2005 Document Revised: 07/26/2013 Document Reviewed: 04/10/2012 [facility] Patient Information 2015 [place], [place]. This information is not intended to replace advice given to you by your health care provider. Make sure you discuss any questions you have with your health care provider.

## 2014-10-13 NOTE — Progress Notes (Signed)
Follow up apt made by [name] RN for  10/19/14 at 1200 with Dr. Venetia Night. Patient has Medicaid for medical and prescription drug coverage. [name] [facility] [phone]

## 2014-10-17 ENCOUNTER — Encounter: Payer: Medicaid Other | Admitting: Surgery

## 2014-10-17 ENCOUNTER — Encounter (HOSPITAL_COMMUNITY): Payer: Medicaid Other

## 2014-10-17 ENCOUNTER — Inpatient Hospital Stay (HOSPITAL_COMMUNITY): Admission: RE | Admit: 2014-10-17 | Payer: Medicaid Other | Source: Ambulatory Visit

## 2014-10-17 ENCOUNTER — Telehealth: Payer: Self-pay | Admitting: Internal Medicine

## 2014-10-17 ENCOUNTER — Inpatient Hospital Stay (HOSPITAL_COMMUNITY)
Admission: RE | Admit: 2014-10-17 | Discharge: 2014-10-17 | Disposition: A | Discharge status: Home / Self Care | Payer: Medicaid Other | Source: Ambulatory Visit | Attending: Surgery | Admitting: Surgery

## 2014-10-17 DIAGNOSIS — I739 Peripheral vascular disease, unspecified: Secondary | ICD-10-CM

## 2014-10-17 NOTE — Telephone Encounter (Signed)
Kendal Hymen called from [facility] stating that the patient has no showed twice to the visits and has not been able to reach him to reschedule. She stated the importance of keeping the appointments due to availability. Please f/u

## 2014-10-18 ENCOUNTER — Telehealth: Payer: Self-pay

## 2014-10-18 NOTE — Telephone Encounter (Signed)
Called the patient to confirm his appointment for tomorrow.  He said that he is aware that he has an appointment tomorrow at 1200 and he his friend will drive him to the clinic. He noted that he is " feeling good" and said that he has all of his medications and has been taking them as ordered. He said that his breathing is " good" but he still has swelling of his feet and will have the doctor check them tomorrow. No other problems/concerns reported.

## 2014-10-19 ENCOUNTER — Encounter: Payer: Self-pay | Admitting: Cardiology

## 2014-10-19 ENCOUNTER — Ambulatory Visit: Payer: Medicaid Other | Admitting: Cardiology

## 2014-10-19 ENCOUNTER — Ambulatory Visit: Payer: Medicaid Other | Attending: Cardiology | Admitting: Cardiology

## 2014-10-19 ENCOUNTER — Inpatient Hospital Stay: Payer: Medicaid Other | Admitting: Family Medicine

## 2014-10-19 ENCOUNTER — Telehealth: Payer: Self-pay | Admitting: Family Medicine

## 2014-10-19 ENCOUNTER — Other Ambulatory Visit: Payer: Self-pay

## 2014-10-19 ENCOUNTER — Telehealth: Payer: Self-pay

## 2014-10-19 ENCOUNTER — Other Ambulatory Visit: Payer: Self-pay | Admitting: Family Medicine

## 2014-10-19 VITALS — BP 109/75 | HR 105 | Temp 98.2°F | Resp 18 | Ht 64.0 in | Wt 221.2 lb

## 2014-10-19 DIAGNOSIS — I482 Chronic atrial fibrillation, unspecified: Secondary | ICD-10-CM

## 2014-10-19 DIAGNOSIS — Z7982 Long term (current) use of aspirin: Secondary | ICD-10-CM | POA: Insufficient documentation

## 2014-10-19 DIAGNOSIS — F17200 Nicotine dependence, unspecified, uncomplicated: Secondary | ICD-10-CM | POA: Insufficient documentation

## 2014-10-19 DIAGNOSIS — I10 Essential (primary) hypertension: Secondary | ICD-10-CM | POA: Diagnosis not present

## 2014-10-19 DIAGNOSIS — I5042 Chronic combined systolic (congestive) and diastolic (congestive) heart failure: Secondary | ICD-10-CM | POA: Insufficient documentation

## 2014-10-19 DIAGNOSIS — Z720 Tobacco use: Secondary | ICD-10-CM | POA: Diagnosis not present

## 2014-10-19 DIAGNOSIS — E119 Type 2 diabetes mellitus without complications: Secondary | ICD-10-CM | POA: Insufficient documentation

## 2014-10-19 DIAGNOSIS — I2510 Atherosclerotic heart disease of native coronary artery without angina pectoris: Secondary | ICD-10-CM | POA: Insufficient documentation

## 2014-10-19 DIAGNOSIS — Z7901 Long term (current) use of anticoagulants: Secondary | ICD-10-CM | POA: Diagnosis not present

## 2014-10-19 DIAGNOSIS — F17210 Nicotine dependence, cigarettes, uncomplicated: Secondary | ICD-10-CM | POA: Insufficient documentation

## 2014-10-19 DIAGNOSIS — Z951 Presence of aortocoronary bypass graft: Secondary | ICD-10-CM | POA: Insufficient documentation

## 2014-10-19 DIAGNOSIS — Z79899 Other long term (current) drug therapy: Secondary | ICD-10-CM | POA: Diagnosis not present

## 2014-10-19 MED ORDER — TRAMADOL HCL 50 MG PO TABS: 50.0000 mg | ORAL_TABLET | Freq: Three times a day (TID) | ORAL | Status: DC | PRN

## 2014-10-19 MED ORDER — SPIRONOLACTONE 25 MG PO TABS: 25.0000 mg | ORAL_TABLET | Freq: Every day | ORAL | Status: DC

## 2014-10-19 NOTE — Assessment & Plan Note (Signed)
He is currently stable though his weight is up a few pounds. I've asked him to continue with the Lasix 120 mg in the morning, 80 mg in the evening, metoprolol was reviewed at 75 mg twice a day, spironolactone 25 mg daily. I'll see him back in weeks for close follow-up.

## 2014-10-19 NOTE — Assessment & Plan Note (Signed)
Advised to quit.  

## 2014-10-19 NOTE — Progress Notes (Signed)
HPI Jacob [name] comes today per the request of Dr.Amao for the evaluation and management of his chronic combined systolic and diastolic heart failure. He has an ischemic cardiomyopathy and is status post bypass surgery with 2 grafts. His last echocardiogram shows an ejection fraction 25%, moderately dilated left ventricle, dilated right ventricle with reduction in systolic function, biatrial severe enlargement, status post ICD defibrillator in the right ventricle.  He's had problems with low blood pressure so he is not on ACE inhibitor. He was recently changed from carvedilol to metoprolol but is not taking the drug appropriately.  He does weigh himself every day. His weight at home is around 212-14. His weight here on last visit was too 16 is 221 today. He is fully clothed today.  He denies orthopnea or PND. He does have significant dyspnea on exertion. He has stable 1-2+ pitting edema.  Past Medical History  Diagnosis Date  . Hypertension   . Systolic heart failure     EF is 40-45% by echo, December 2013  . Noncompliance   . CAD (coronary artery disease) Sept 2013    s/p cardiac cath showing occlusion of small RCA with collaterals  . Chronic anticoagulation     on coumadin  . Atrial fibrillation   . Peripheral arterial disease   . Automatic implantable cardioverter-defibrillator in situ   . High cholesterol   . Myocardial infarction 2014  . Type II diabetes mellitus   . CHF (congestive heart failure)   . Shortness of breath dyspnea     Current Outpatient Prescriptions  Medication Sig Dispense Refill  . albuterol (PROVENTIL HFA;VENTOLIN HFA) 108 (90 BASE) MCG/ACT inhaler Inhale 2 puffs into the lungs every 6 (six) hours as needed for wheezing or shortness of breath. 1 Inhaler 0  . albuterol (PROVENTIL) (2.5 MG/3ML) 0.083% nebulizer solution Take 3 mLs (2.5 mg total) by nebulization every 6 (six) hours as needed for wheezing or shortness of breath. 150 mL 1  . aspirin EC 81 MG EC tablet  Take 1 tablet (81 mg total) by mouth daily. 30 tablet 5  . atorvastatin (LIPITOR) 40 MG tablet Take 1 tablet (40 mg total) by mouth daily at 6 PM. 30 tablet 5  . furosemide (LASIX) 40 MG tablet 3 tabs (120mg ) in the morning and 2 tabs (80mg ) in the evening 150 tablet 1  . glipiZIDE (GLUCOTROL) 5 MG tablet Take 1 tablet (5 mg total) by mouth 2 (two) times daily before a meal. 60 tablet 0  . metoprolol 75 MG TABS Take 75 mg by mouth 2 (two) times daily. 60 tablet 2  . nicotine (NICODERM CQ) 7 mg/24hr patch Place 1 patch (7 mg total) onto the skin daily. 28 patch 0  . rivaroxaban (XARELTO) 20 MG TABS tablet Take 1 tablet (20 mg total) by mouth daily before supper. 30 tablet 6  . spironolactone (ALDACTONE) 25 MG tablet Take 1 tablet (25 mg total) by mouth daily. 30 tablet 6  . traMADol (ULTRAM) 50 MG tablet Take 1 tablet (50 mg total) by mouth every 8 (eight) hours as needed. (Patient taking differently: Take 50 mg by mouth every 8 (eight) hours as needed for moderate pain. ) 30 tablet 0  . docusate sodium (COLACE) 100 MG capsule Take 1 capsule (100 mg total) by mouth daily. (Patient not taking: Reported on 10/19/2014) 30 capsule 5  . ferrous sulfate 325 (65 FE) MG tablet Take 1 tablet (325 mg total) by mouth 3 (three) times daily with meals. (Patient not  taking: Reported on 10/19/2014) 90 tablet 5   No current facility-administered medications for this visit.    No Known Allergies  Family History  Problem Relation Age of Onset  . Diabetes Mother     History   Social History  . Marital Status: Divorced    Spouse Name: N/A  . Number of Children: 3  . Years of Education: N/A   Occupational History  . Unemployed    Social History Main Topics  . Smoking status: Current Every Day Smoker -- 0.50 packs/day for 38 years    Types: Cigarettes  . Smokeless tobacco: Never Used  . Alcohol Use: No  . Drug Use: No  . Sexual Activity: Yes   Other Topics Concern  . Not on file   Social History  Narrative   Has an apartment with a roommate. He was living on the streets in 2013-01-03.  He reports that his father died in [place] in 01/03/2013.  He is divorced.  He is no longer estranged from his son, but still from his daughter who lives locally.  Neither of his parents, nor any siblings have any history of CAD.    ROS ALL NEGATIVE EXCEPT THOSE NOTED IN HPI  PE  General Appearance: well developed, well nourished in no acute distress, chronically ill-appearing HEENT: symmetrical face, PERRLA, poor dentition Neck: no JVD, thyromegaly, or adenopathy, trachea midline Chest: symmetric without deformity Cardiac: PMI non-displaced, irregular rate and rhythm, normal S1, S2, no gallop or murmur Lung: clear to ausculation and percussion Vascular: all pulses full without bruits  Abdominal: nondistended, nontender, good bowel sounds, no HSM, no bruits Extremities: no cyanosis, clubbing, 2+ pretibial edema no sign of DVT, no varicosities  Skin: normal color, no rashes Neuro: alert and oriented x 3, non-focal Pysch: normal affect  EKG  BMET    Component Value Date/Time   NA 134* 10/13/2014 0410   K 4.9 10/13/2014 0410   CL 95* 10/13/2014 0410   CO2 27 10/13/2014 0410   GLUCOSE 120* 10/13/2014 0410   BUN 36* 10/13/2014 0410   CREATININE 1.35* 10/13/2014 0410   CREATININE 1.07 10/04/2014 1256   CALCIUM 8.6* 10/13/2014 0410   GFRNONAA 57* 10/13/2014 0410   GFRAA >60 10/13/2014 0410    Lipid Panel     Component Value Date/Time   CHOL 80 08/05/2014 0317   TRIG 51 08/05/2014 0317   HDL 18* 08/05/2014 0317   CHOLHDL 4.4 08/05/2014 0317   VLDL 10 08/05/2014 0317   LDLCALC 52 08/05/2014 0317    CBC    Component Value Date/Time   WBC 11.1* 10/11/2014 0524   RBC 5.41 10/11/2014 0524   RBC 3.93* 01/08/2013 1000   HGB 10.5* 10/11/2014 0524   HCT 37.8* 10/11/2014 0524   PLT 313 10/11/2014 0524   MCV 69.9* 10/11/2014 0524   MCH 19.4* 10/11/2014 0524   MCHC 27.8* 10/11/2014  0524   RDW 21.5* 10/11/2014 0524   LYMPHSABS 1.5 09/28/2014 1935   MONOABS 0.7 09/28/2014 1935   EOSABS 0.1 09/28/2014 1935   BASOSABS 0.1 09/28/2014 1935

## 2014-10-19 NOTE — Telephone Encounter (Signed)
Call placed to patient to discuss scheduling Cardiology appointment with Dr. Daleen Squibb on 10/19/14.  Unable to reach patient; HIPPA compliant voicemail left for patient requesting return call.

## 2014-10-19 NOTE — Telephone Encounter (Signed)
Called patient. Patient verified name and date of birth. Patient notified of his appointment today with Dr.Wall at 12pm. Patient expressed that he is in [place] and could not make it by 12. Patient asked if he could be seen at 2pm. Nurse tech spoke with Dr.Wall about changing patient appointment to time requested. Dr.Wall agreed to see patient at 2:30pm. Patient agreed to appointment time at 2:30pm. Patient notified that he needs to arrive by 2pm so that he can be seen on time. Patient voiced understanding and states that he will be here by 2pm.

## 2014-10-19 NOTE — Progress Notes (Signed)
Patient here for CHF. Patient reports pain today in his right leg rated at a 7, described as pressure. Pain comes and goes. Pain has been present for a long time per patient.   Patient needs refills on aspirin, atorvastatin, xarelto, spironolactone, and tramadol.  Patient has been out of atorvastatin for 2 days, xarelto for 1 day, and took last spironolactone today. Patient has taken lasix, metoprolol, glipizide, and spironolactone today. Patient states he is on lasix 80 mg and is taking 2 tabs in the morning and 1 at night. Patient states he is taking metoprolol 3 tabs once daily.  Patient does not think his medications are working because he keeps them in the car and it is really hot. Patient reports he took his lasix this morning around 7am and has not used the bathroom at all today.   Patient reports 1 occurrence of chest pain since last visit. Patient reports SOB today and when active. Patient denies any wheezing. Patient reports swelling currently in both legs that has been present for a while now.  Patient smokes .5 pack of cigarettes daily.

## 2014-10-19 NOTE — Patient Instructions (Signed)
Thank you for coming in today. Please come back to see Dr. Daleen Squibb in 4 weeks.

## 2014-10-19 NOTE — Assessment & Plan Note (Signed)
Stable. Continue secondary preventative therapy. 

## 2014-10-19 NOTE — Assessment & Plan Note (Signed)
Stable and asymptomatic. Goal heart rate between 80 and 100 bpm.

## 2014-10-24 ENCOUNTER — Telehealth: Payer: Self-pay

## 2014-10-24 NOTE — Telephone Encounter (Addendum)
Called the patient to check on his status and schedule a follow up appointment w/ Dr Venetia Night.  He said that he is dong " pretty good" and noted that he has all of medications and has been taking them as ordered.  He said that he is not having any problems breathing but his feet are still a "a little swollen."  He was agreeable to scheduling an appointment with Dr Venetia Night.  The call was then put on hold and an appointment was scheduled for 10/28/14 @ 1230 w/ Dr Venetia Night.  The patient had hung up the phone when CM returned to the phone to tell him the appointment time.  This CM then called him back at # [phone]  and left a voice mail message requesting a return call to # [phone] or [phone].

## 2014-10-27 ENCOUNTER — Telehealth: Payer: Self-pay

## 2014-10-27 NOTE — Telephone Encounter (Signed)
This Case Manager placed call to patient to check on status and to remind of appointment on 10/28/14 at 1230 with Dr. Venetia Night. Unable to reach patient; voicemail left requesting return call.

## 2014-10-28 ENCOUNTER — Ambulatory Visit: Payer: Medicaid Other | Admitting: Family Medicine

## 2014-10-29 ENCOUNTER — Emergency Department (HOSPITAL_COMMUNITY): Payer: Medicaid Other

## 2014-10-29 ENCOUNTER — Encounter (HOSPITAL_COMMUNITY): Payer: Self-pay | Admitting: *Deleted

## 2014-10-29 ENCOUNTER — Inpatient Hospital Stay (HOSPITAL_COMMUNITY)
Admission: EM | Admit: 2014-10-29 | Discharge: 2014-10-31 | DRG: 292 | Disposition: A | Discharge status: Home / Self Care | Payer: Medicaid Other | Attending: Oncology | Admitting: Oncology

## 2014-10-29 DIAGNOSIS — R079 Chest pain, unspecified: Secondary | ICD-10-CM

## 2014-10-29 DIAGNOSIS — I10 Essential (primary) hypertension: Secondary | ICD-10-CM | POA: Diagnosis present

## 2014-10-29 DIAGNOSIS — Z95810 Presence of automatic (implantable) cardiac defibrillator: Secondary | ICD-10-CM

## 2014-10-29 DIAGNOSIS — I252 Old myocardial infarction: Secondary | ICD-10-CM

## 2014-10-29 DIAGNOSIS — I272 Other secondary pulmonary hypertension: Secondary | ICD-10-CM | POA: Diagnosis present

## 2014-10-29 DIAGNOSIS — E119 Type 2 diabetes mellitus without complications: Secondary | ICD-10-CM | POA: Diagnosis present

## 2014-10-29 DIAGNOSIS — Z5900 Homelessness unspecified: Secondary | ICD-10-CM | POA: Insufficient documentation

## 2014-10-29 DIAGNOSIS — Z7901 Long term (current) use of anticoagulants: Secondary | ICD-10-CM | POA: Diagnosis not present

## 2014-10-29 DIAGNOSIS — E785 Hyperlipidemia, unspecified: Secondary | ICD-10-CM | POA: Diagnosis present

## 2014-10-29 DIAGNOSIS — I5041 Acute combined systolic (congestive) and diastolic (congestive) heart failure: Principal | ICD-10-CM

## 2014-10-29 DIAGNOSIS — I4891 Unspecified atrial fibrillation: Secondary | ICD-10-CM | POA: Diagnosis present

## 2014-10-29 DIAGNOSIS — Z955 Presence of coronary angioplasty implant and graft: Secondary | ICD-10-CM | POA: Diagnosis not present

## 2014-10-29 DIAGNOSIS — Z7982 Long term (current) use of aspirin: Secondary | ICD-10-CM

## 2014-10-29 DIAGNOSIS — I509 Heart failure, unspecified: Secondary | ICD-10-CM

## 2014-10-29 DIAGNOSIS — I472 Ventricular tachycardia: Secondary | ICD-10-CM | POA: Diagnosis present

## 2014-10-29 DIAGNOSIS — I739 Peripheral vascular disease, unspecified: Secondary | ICD-10-CM | POA: Diagnosis present

## 2014-10-29 DIAGNOSIS — Z9114 Patient's other noncompliance with medication regimen: Secondary | ICD-10-CM | POA: Diagnosis present

## 2014-10-29 DIAGNOSIS — Z9119 Patient's noncompliance with other medical treatment and regimen: Secondary | ICD-10-CM | POA: Diagnosis present

## 2014-10-29 DIAGNOSIS — I2510 Atherosclerotic heart disease of native coronary artery without angina pectoris: Secondary | ICD-10-CM | POA: Diagnosis present

## 2014-10-29 DIAGNOSIS — D509 Iron deficiency anemia, unspecified: Secondary | ICD-10-CM | POA: Diagnosis present

## 2014-10-29 DIAGNOSIS — I5023 Acute on chronic systolic (congestive) heart failure: Secondary | ICD-10-CM

## 2014-10-29 DIAGNOSIS — F17210 Nicotine dependence, cigarettes, uncomplicated: Secondary | ICD-10-CM | POA: Diagnosis present

## 2014-10-29 DIAGNOSIS — R0600 Dyspnea, unspecified: Secondary | ICD-10-CM

## 2014-10-29 DIAGNOSIS — Z79899 Other long term (current) drug therapy: Secondary | ICD-10-CM

## 2014-10-29 DIAGNOSIS — Z951 Presence of aortocoronary bypass graft: Secondary | ICD-10-CM

## 2014-10-29 DIAGNOSIS — R0602 Shortness of breath: Secondary | ICD-10-CM

## 2014-10-29 DIAGNOSIS — I2583 Coronary atherosclerosis due to lipid rich plaque: Secondary | ICD-10-CM

## 2014-10-29 LAB — GLUCOSE, CAPILLARY
Glucose-Capillary: 184 mg/dL — ABNORMAL HIGH (ref 65–99)
Glucose-Capillary: 242 mg/dL — ABNORMAL HIGH (ref 65–99)

## 2014-10-29 LAB — TROPONIN I: Troponin I: 0.03 ng/mL (ref <0.031)

## 2014-10-29 LAB — CBC
HEMATOCRIT: 36.2 % — AB (ref 39.0–52.0)
Hemoglobin: 10.5 g/dL — ABNORMAL LOW (ref 13.0–17.0)
MCH: 19.6 pg — AB (ref 26.0–34.0)
MCHC: 29 g/dL — ABNORMAL LOW (ref 30.0–36.0)
MCV: 67.5 fL — AB (ref 78.0–100.0)
PLATELETS: 295 10*3/uL (ref 150–400)
RBC: 5.36 MIL/uL (ref 4.22–5.81)
RDW: 21.8 % — ABNORMAL HIGH (ref 11.5–15.5)
WBC: 8.4 10*3/uL (ref 4.0–10.5)

## 2014-10-29 LAB — BASIC METABOLIC PANEL
Anion gap: 11 (ref 5–15)
BUN: 24 mg/dL — ABNORMAL HIGH (ref 6–20)
CO2: 21 mmol/L — AB (ref 22–32)
Calcium: 8.9 mg/dL (ref 8.9–10.3)
Chloride: 98 mmol/L — ABNORMAL LOW (ref 101–111)
Creatinine, Ser: 1.1 mg/dL (ref 0.61–1.24)
GFR calc Af Amer: >60 mL/min (ref >60)
GFR calc non Af Amer: >60 mL/min (ref >60)
Glucose, Bld: 260 mg/dL — ABNORMAL HIGH (ref 65–99)
Potassium: 4.6 mmol/L (ref 3.5–5.1)
SODIUM: 130 mmol/L — AB (ref 135–145)

## 2014-10-29 LAB — MRSA PCR SCREENING: MRSA BY PCR: NEGATIVE

## 2014-10-29 LAB — PROTIME-INR
INR: 1.51 — ABNORMAL HIGH (ref 0.00–1.49)
Prothrombin Time: 18.3 seconds — ABNORMAL HIGH (ref 11.6–15.2)

## 2014-10-29 LAB — BRAIN NATRIURETIC PEPTIDE: B Natriuretic Peptide: 1232.4 pg/mL — ABNORMAL HIGH (ref 0.0–100.0)

## 2014-10-29 MED ORDER — ACETAMINOPHEN 650 MG RE SUPP
650.0000 mg | Freq: Four times a day (QID) | RECTAL | Status: DC | PRN
Start: 2014-10-29 — End: 2014-10-31

## 2014-10-29 MED ORDER — ACETAMINOPHEN 325 MG PO TABS
650.0000 mg | ORAL_TABLET | Freq: Four times a day (QID) | ORAL | Status: DC | PRN
  Administered 2014-10-30 (×2): 650 mg via ORAL
  Filled 2014-10-29 (×2): qty 2

## 2014-10-29 MED ORDER — DILTIAZEM HCL 25 MG/5ML IV SOLN
10.0000 mg | Freq: Once | INTRAVENOUS | Status: AC
  Administered 2014-10-29: 10 mg via INTRAVENOUS

## 2014-10-29 MED ORDER — FUROSEMIDE 10 MG/ML IJ SOLN
80.0000 mg | Freq: Once | INTRAMUSCULAR | Status: AC
  Administered 2014-10-29: 80 mg via INTRAVENOUS
  Filled 2014-10-29: qty 8

## 2014-10-29 MED ORDER — DOCUSATE SODIUM 100 MG PO CAPS
100.0000 mg | ORAL_CAPSULE | Freq: Every day | ORAL | Status: DC
  Administered 2014-10-29 – 2014-10-31 (×3): 100 mg via ORAL
  Filled 2014-10-29 (×3): qty 1

## 2014-10-29 MED ORDER — RIVAROXABAN 20 MG PO TABS
20.0000 mg | ORAL_TABLET | Freq: Every day | ORAL | Status: DC
  Administered 2014-10-29 – 2014-10-30 (×2): 20 mg via ORAL
  Filled 2014-10-29 (×3): qty 1

## 2014-10-29 MED ORDER — DEXTROSE 5 % IV SOLN
5.0000 mg/h | INTRAVENOUS | Status: DC
  Administered 2014-10-29: 15 mg/h via INTRAVENOUS
  Administered 2014-10-29: 10 mg/h via INTRAVENOUS
  Administered 2014-10-29: 5 mg/h via INTRAVENOUS
  Administered 2014-10-30: 15 mg/h via INTRAVENOUS

## 2014-10-29 MED ORDER — MORPHINE SULFATE 4 MG/ML IJ SOLN
4.0000 mg | Freq: Once | INTRAMUSCULAR | Status: AC
  Administered 2014-10-29: 4 mg via INTRAVENOUS
  Filled 2014-10-29: qty 1

## 2014-10-29 MED ORDER — FUROSEMIDE 10 MG/ML IJ SOLN
80.0000 mg | Freq: Two times a day (BID) | INTRAMUSCULAR | Status: DC
  Administered 2014-10-30 – 2014-10-31 (×3): 80 mg via INTRAVENOUS
  Filled 2014-10-29 (×5): qty 8

## 2014-10-29 MED ORDER — FERROUS SULFATE 325 (65 FE) MG PO TABS
325.0000 mg | ORAL_TABLET | Freq: Every day | ORAL | Status: DC
Start: 2014-10-30 — End: 2014-10-31
  Administered 2014-10-30 – 2014-10-31 (×2): 325 mg via ORAL
  Filled 2014-10-29 (×3): qty 1

## 2014-10-29 MED ORDER — INSULIN ASPART 100 UNIT/ML ~~LOC~~ SOLN
0.0000 [IU] | Freq: Three times a day (TID) | SUBCUTANEOUS | Status: DC
  Administered 2014-10-30 (×2): 8 [IU] via SUBCUTANEOUS
  Administered 2014-10-31 (×2): 3 [IU] via SUBCUTANEOUS

## 2014-10-29 MED ORDER — ATORVASTATIN CALCIUM 40 MG PO TABS
40.0000 mg | ORAL_TABLET | Freq: Every day | ORAL | Status: DC
  Administered 2014-10-29 – 2014-10-30 (×2): 40 mg via ORAL
  Filled 2014-10-29 (×3): qty 1

## 2014-10-29 MED ORDER — SPIRONOLACTONE 25 MG PO TABS
25.0000 mg | ORAL_TABLET | Freq: Every day | ORAL | Status: DC
  Administered 2014-10-29 – 2014-10-31 (×3): 25 mg via ORAL
  Filled 2014-10-29 (×3): qty 1

## 2014-10-29 MED ORDER — SODIUM CHLORIDE 0.9 % IJ SOLN
3.0000 mL | Freq: Two times a day (BID) | INTRAMUSCULAR | Status: DC
  Administered 2014-10-29 – 2014-10-31 (×4): 3 mL via INTRAVENOUS

## 2014-10-29 MED ORDER — DILTIAZEM LOAD VIA INFUSION
15.0000 mg | Freq: Once | INTRAVENOUS | Status: AC
  Administered 2014-10-29: 15 mg via INTRAVENOUS
  Filled 2014-10-29: qty 15

## 2014-10-29 MED ORDER — ASPIRIN EC 81 MG PO TBEC
81.0000 mg | DELAYED_RELEASE_TABLET | Freq: Every day | ORAL | Status: DC
  Administered 2014-10-29 – 2014-10-31 (×3): 81 mg via ORAL
  Filled 2014-10-29 (×3): qty 1

## 2014-10-29 NOTE — ED Notes (Signed)
Pt reports onset of sob this am, has hx of chf and reports sleep and swelling to legs. HR 150 at triage.

## 2014-10-29 NOTE — H&P (Signed)
Date: 10/29/2014               Patient Name:  Jacob [name] MRN: [id number]  DOB: [birth year] Age / Sex: 59 y.o., male   PCP: [name], MD              Medical Service: Internal Medicine Teaching Service              Attending Physician: Dr. Burns Spain, MD    First Contact: Eyvonne Mechanic, MS 3 Pager: [phone]  Second Contact: Dr. Darreld Mclean Pager: [phone]  Third Contact Dr. Carlynn Purl Pager: [phone]       After Hours (After 5p/  First Contact Pager: [phone]  weekends / holidays): Second Contact Pager: [phone]   Chief Complaint: Shortness of breath  History of Present Illness: Jacob [name] is a 59 y.o. male with a PMH of HTN, HLD, systolic HF (EF 20-25%), CAD, Afib, T2DM, who presents with shortness of breath x1 day. The patient was admitted last month for similar symptoms. He stated that the symptoms began this morning, and included shortness of breath at rest, difficulty sleeping and lying down, LE edema, and a dry cough. He did endorse some chest pain this morning as well, 4-5/10 in intensity and sharp in nature, without any radiation that subsided on it's own. No current chest pain. He noted that he had been feeling fine and doing well until 4 days ago when he ran out of medications. He attempted to go to the pharmacy to refill his prescriptions but was told to come back the next day since they didn't have them. He was then unable to return the next day due to transportation issues. He also was unable to attend his appointment yesterday due to communication errors (phone ran out of minutes as he was trying to confirm appointment). He feels that he has gained approximately ten pounds since his last hospitalization, and that the bulk of the fluid is being retained in his legs.  Meds: Current Facility-Administered Medications  Medication Dose Route Frequency Provider Last Rate Last Dose  . diltiazem (CARDIZEM) 100 mg in dextrose 5 % 100 mL (1 mg/mL) infusion  5-15  mg/hr Intravenous Continuous Azalia Bilis, MD 15 mL/hr at 10/29/14 1707 15 mg/hr at 10/29/14 1707   Current Outpatient Prescriptions  Medication Sig Dispense Refill  . albuterol (PROVENTIL HFA;VENTOLIN HFA) 108 (90 BASE) MCG/ACT inhaler Inhale 2 puffs into the lungs every 6 (six) hours as needed for wheezing or shortness of breath. 1 Inhaler 0  . albuterol (PROVENTIL) (2.5 MG/3ML) 0.083% nebulizer solution Take 3 mLs (2.5 mg total) by nebulization every 6 (six) hours as needed for wheezing or shortness of breath. 150 mL 1  . atorvastatin (LIPITOR) 40 MG tablet Take 1 tablet (40 mg total) by mouth daily at 6 PM. 30 tablet 5  . furosemide (LASIX) 40 MG tablet 3 tabs ( ) in the morning and 2 tabs ( ) in the evening 150 tablet 1  . glipiZIDE (GLUCOTROL) 5 MG tablet Take 1 tablet (5 mg total) by mouth 2 (two) times daily before a meal. 60 tablet 0  . metoprolol 75 MG TABS Take 75 mg by mouth 2 (two) times daily. 60 tablet 2  . nicotine (NICODERM CQ) 7 mg/24hr patch Place 1 patch (7 mg total) onto the skin daily. 28 patch 0  . rivaroxaban (XARELTO) 20 MG TABS tablet Take 1 tablet (20 mg total) by mouth daily before supper. 30 tablet 6  . spironolactone (ALDACTONE)  25 MG tablet Take 1 tablet (25 mg total) by mouth daily. 30 tablet 6  . traMADol (ULTRAM) 50 MG tablet Take 1 tablet (50 mg total) by mouth every 8 (eight) hours as needed for moderate pain. 30 tablet 0  . aspirin EC 81 MG EC tablet Take 1 tablet (81 mg total) by mouth daily. (Patient not taking: Reported on 10/29/2014) 30 tablet 5  . docusate sodium (COLACE) 100 MG capsule Take 1 capsule (100 mg total) by mouth daily. (Patient not taking: Reported on 10/19/2014) 30 capsule 5  . ferrous sulfate 325 (65 FE) MG tablet Take 1 tablet (325 mg total) by mouth 3 (three) times daily with meals. (Patient not taking: Reported on 10/19/2014) 90 tablet 5    Allergies: Allergies as of 10/29/2014  . (No Known Allergies)   Past Medical History    Diagnosis Date  . Hypertension   . Systolic heart failure     EF is 40-45% by echo, December 2013  . Noncompliance   . CAD (coronary artery disease) Sept 2013    s/p cardiac cath showing occlusion of small RCA with collaterals  . Chronic anticoagulation     on coumadin  . Atrial fibrillation   . Peripheral arterial disease   . Automatic implantable cardioverter-defibrillator in situ   . High cholesterol   . Myocardial infarction 2014  . Type II diabetes mellitus   . CHF (congestive heart failure)   . Shortness of breath dyspnea    Past Surgical History  Procedure Laterality Date  . Implantable cardioverter defibrillator implant      [place] in 07/2012; [employer]  . Coronary artery bypass graft  09/2012    2 vessels per patient [name]   . Coronary angioplasty with stent placement  11/2011    "1"  . Cardiac catheterization  09/2012  . Iliac artery stent Right 08/30/2013  . Left and right heart catheterization with coronary angiogram N/A 12/02/2011    Procedure: LEFT AND RIGHT HEART CATHETERIZATION WITH CORONARY ANGIOGRAM;  Surgeon: Kathleene Hazel, MD;  Location: [facility] CATH LAB;  Service: Cardiovascular;  Laterality: N/A;  . Lower extremity angiogram N/A 08/30/2013    Procedure: LOWER EXTREMITY ANGIOGRAM;  Surgeon: Runell Gess, MD;  Location: [facility] CATH LAB;  Service: Cardiovascular;  Laterality: N/A;  . Lower extremity angiogram N/A 12/02/2013    Procedure: LOWER EXTREMITY ANGIOGRAM;  Surgeon: Runell Gess, MD;  Location: [facility] CATH LAB;  Service: Cardiovascular;  Laterality: N/A;   Family History  Problem Relation Age of Onset  . Diabetes Mother    History   Social History  . Marital Status: Divorced    Spouse Name: N/A  . Number of Children: 3  . Years of Education: N/A   Occupational History  . Unemployed    Social History Main Topics  . Smoking status: Current Every Day Smoker -- 0.50 packs/day for 38 years    Types: Cigarettes  . Smokeless tobacco:  Never Used  . Alcohol Use: No  . Drug Use: No  . Sexual Activity: Yes   Other Topics Concern  . Not on file   Social History Narrative   Has an apartment with a roommate. He was living on the streets in 27-Jan-2013.  He reports that his father died in [place] in January 27, 2013.  He is divorced.  He is no longer estranged from his son, but still from his daughter who lives locally.  Neither of his parents, nor any siblings have any  history of CAD.    Review of Systems: CONSTITUTIONAL:  No fevers or chills RESPIRATORY:  Endorses difficulty breathing and cough CARDIOVASCULAR:  No palpitations, no chest pain currently GASTROINTESTINAL: No nausea or vomiting, no diarrhea or constipation   MUSCULOSKELETAL: Endorse LE pain and swelling NEUROLOGICAL: No dizziness or headache SKIN:  Normal skin tone and turgor,   Physical Exam: Blood pressure 124/86, pulse 105, temperature 98.3 F (36.8 C), temperature source Oral, resp. rate 35, height 5\' 4"  (1.626 m), weight 102.921 kg (226 lb 14.4 oz), SpO2 95 %. BP 124/86 mmHg  Pulse 105  Temp(Src) 98.3 F (36.8 C) (Oral)  Resp 35  Ht 5\' 4"  (1.626 m)  Wt 102.921 kg (226 lb 14.4 oz)  BMI 38.93 kg/m2  SpO2 95%  General Appearance:    Alert, cooperative, no distress, appears stated age  Head:    Normocephalic, without obvious abnormality, atraumatic  Eyes:     EOM's intact       Neck:   Symmetrical, trachea midline, JVP present 9 cm above clavicle  Lungs:     Clear to auscultation bilaterally, respirations unlabored  Chest wall:    No tenderness or deformity  Heart:    Irregularly irregular rhythm, S1 and S2 normal, no murmur, rub   or gallop  Abdomen:     Soft, non-tender, bowel sounds active, large pannus   Extremities:   Extremities atraumatic, 2+ pitting edema bilateral LE, no cyanosis  Pulses:   2+ and symmetric all extremities  Skin:   Skin color, turgor normal, occasional scaly sores and areas of irritation on LE bilaterally        Lab  results: Results for orders placed or performed during the hospital encounter of 10/29/14 (from the past 24 hour(s))  Basic metabolic panel     Status: Abnormal   Collection Time: 10/29/14  1:20 PM  Result Value Ref Range   Sodium 130 (L) 135 - 145 mmol/L   Potassium 4.6 3.5 - 5.1 mmol/L   Chloride 98 (L) 101 - 111 mmol/L   CO2 21 (L) 22 - 32 mmol/L   Glucose, Bld 260 (H) 65 - 99 mg/dL   BUN 24 (H) 6 - 20 mg/dL   Creatinine, Ser 4.09 0.61 - 1.24 mg/dL   Calcium 8.9 8.9 - 81.1 mg/dL   GFR calc non Af Amer >60 >60 mL/min   GFR calc Af Amer >60 >60 mL/min   Anion gap 11 5 - 15  CBC     Status: Abnormal   Collection Time: 10/29/14  1:20 PM  Result Value Ref Range   WBC 8.4 4.0 - 10.5 K/uL   RBC 5.36 4.22 - 5.81 MIL/uL   Hemoglobin 10.5 (L) 13.0 - 17.0 g/dL   HCT 91.4 (L) 78.2 - 95.6 %   MCV 67.5 (L) 78.0 - 100.0 fL   MCH 19.6 (L) 26.0 - 34.0 pg   MCHC 29.0 (L) 30.0 - 36.0 g/dL   RDW 21.3 (H) 08.6 - 57.8 %   Platelets 295 150 - 400 K/uL  Troponin I     Status: None   Collection Time: 10/29/14  1:20 PM  Result Value Ref Range   Troponin I 0.03 <0.031 ng/mL  Protime-INR - (order if Patient is taking Coumadin / Warfarin)     Status: Abnormal   Collection Time: 10/29/14  1:20 PM  Result Value Ref Range   Prothrombin Time 18.3 (H) 11.6 - 15.2 seconds   INR 1.51 (H) 0.00 - 1.49  Brain natriuretic peptide     Status: Abnormal   Collection Time: 10/29/14  3:40 PM  Result Value Ref Range   B Natriuretic Peptide 1232.4 (H) 0.0 - 100.0 pg/mL    Imaging results:  Dg Chest Port 1 View  10/29/2014   CLINICAL DATA:  59 year old male with a history of previous systolic heart failure. Left-sided chest pain. Shortness of breath.  EXAM: PORTABLE CHEST - 1 VIEW  COMPARISON:  CT 10/11/2014, plain film 10/11/2014, 09/28/2014  FINDINGS: Re- demonstration of cardiomegaly, with similar configuration of the cardiomediastinal silhouette.  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 Unchanged  cardiac pacing device on left chest wall with single lead in place. The distal aspect of the lead is not imaged on the plain film.  Atherosclerosis of the aortic arch.  Diffuse interstitial opacities with interlobular septal thickening. Right basilar opacity with blunting of the right costophrenic angle.  No pneumothorax  IMPRESSION: Evidence of congestive heart failure with pulmonary edema and small right pleural effusion. The effusion appears to be persisted from the comparison study from October 11, 2014.  Surgical changes of median sternotomy and CABG.  Unchanged cardiac pacing device with single lead in place, as above.  Atherosclerosis.  Signed,  Yvone Neu. Loreta Ave, DO  [facility]  [facility]   Electronically Signed   By: Gilmer Mor D.O.   On: 10/29/2014 13:59    Other results: EKG: Atrial fibrillation with RVR present  Assessment & Plan by Problem: Active Problems:   Atrial fibrillation with rapid ventricular response   Acute exacerbation of CHF (congestive heart failure)   Acute on chronic CHF: Considering the recent lapse in medication and constellation of symptoms that resemble previous hospitalizations, acute on chronic CHF exacerbation is our most likely diagnosis. Even though his [place]amp had also lapsed, it would be rare to see a bilateral DVT to cause the lower extremity edema. - Pro-BNP ~1200 (last admission 1100) - Daily Weights -Strict I/Os - Lasix IV 80mg  BID, net goal -1 to 2L - Continue spironolactone 25mg  daily  Atrial Fibrillation with RVR: - Diltiazem 15mg  IV for rate control - Restart home Xarelto 20mg  daily - Telemetry monitoring  CAD:  - Continue home atorvastation 40mg  daily - Continue home ASA 81mg  daily  DM:  -  Hold home glipizide -  SSI  FEN/GI: -Diet: Heart Healthy - Replace electrolytes as needed (K<4, Mg<2)  Prophylaxis: Xarelto  Code: Full  Dispo: Patient homeless upon last discharge, consult CSW  for support options   This is a Psychologist, occupational Note.  The care of the patient was discussed with Dr. Darreld Mclean and the assessment and plan was formulated with their assistance.  Please see their note for official documentation of the patient encounter.   Signed: Eyvonne Mechanic, Med Student 10/29/2014, 5:07 PM

## 2014-10-29 NOTE — ED Notes (Signed)
Per Dr. Patria Mane, hold on increasing dose of Cardizem for now.

## 2014-10-29 NOTE — ED Provider Notes (Signed)
CSN: [id number]     Arrival date & time 10/29/14  1249 History   First MD Initiated Contact with Patient 10/29/14 1326     Chief Complaint  Patient presents with  . Shortness of Breath  . Leg Swelling      The history is provided by the patient and medical records.   Patient is a history of combined systolic heart failure with last echo demonstrating EF 25%.  Patient presents today complaining of chest pain shortness of breath.  He reports his been out of his medications for the past 4 days and presents with atrial fibrillation with RVR.  He reports his been off his anticoagulation over the past several days well.  History of coronary artery disease status post coronary artery bypass graft.  Denies fevers and chills.  No productive cough.  Reports mild orthopnea.  Continues to abuse tobacco.  Symptoms are moderate to severe in severity.   Past Medical History  Diagnosis Date  . Hypertension   . Systolic heart failure     EF is 40-45% by echo, December 2013  . Noncompliance   . CAD (coronary artery disease) Sept 2013    s/p cardiac cath showing occlusion of small RCA with collaterals  . Chronic anticoagulation     on coumadin  . Atrial fibrillation   . Peripheral arterial disease   . Automatic implantable cardioverter-defibrillator in situ   . High cholesterol   . Myocardial infarction 2014  . Type II diabetes mellitus   . CHF (congestive heart failure)   . Shortness of breath dyspnea    Past Surgical History  Procedure Laterality Date  . Implantable cardioverter defibrillator implant      [place] in 07/2012; [employer]  . Coronary artery bypass graft  09/2012    2 vessels per patient [name]   . Coronary angioplasty with stent placement  11/2011    "1"  . Cardiac catheterization  09/2012  . Iliac artery stent Right 08/30/2013  . Left and right heart catheterization with coronary angiogram N/A 12/02/2011    Procedure: LEFT AND RIGHT HEART CATHETERIZATION WITH CORONARY  ANGIOGRAM;  Surgeon: Kathleene Hazel, MD;  Location: [facility] CATH LAB;  Service: Cardiovascular;  Laterality: N/A;  . Lower extremity angiogram N/A 08/30/2013    Procedure: LOWER EXTREMITY ANGIOGRAM;  Surgeon: Runell Gess, MD;  Location: [facility] CATH LAB;  Service: Cardiovascular;  Laterality: N/A;  . Lower extremity angiogram N/A 12/02/2013    Procedure: LOWER EXTREMITY ANGIOGRAM;  Surgeon: Runell Gess, MD;  Location: [facility] CATH LAB;  Service: Cardiovascular;  Laterality: N/A;   Family History  Problem Relation Age of Onset  . Diabetes Mother    History  Substance Use Topics  . Smoking status: Current Every Day Smoker -- 0.50 packs/day for 38 years    Types: Cigarettes  . Smokeless tobacco: Never Used  . Alcohol Use: No    Review of Systems  All other systems reviewed and are negative.     Allergies  Review of patient's allergies indicates no known allergies.  Home Medications   Prior to Admission medications   Medication Sig Start Date End Date Taking? Authorizing Provider  albuterol (PROVENTIL HFA;VENTOLIN HFA) 108 (90 BASE) MCG/ACT inhaler Inhale 2 puffs into the lungs every 6 (six) hours as needed for wheezing or shortness of breath. 09/30/14  Yes [name], MD  albuterol (PROVENTIL) (2.5 MG/3ML) 0.083% nebulizer solution Take 3 mLs (2.5 mg total) by nebulization every 6 (six) hours as  needed for wheezing or shortness of breath. 09/06/14  Yes Jaclyn Shaggy, MD  atorvastatin (LIPITOR) 40 MG tablet Take 1 tablet (40 mg total) by mouth daily at 6 PM. 10/13/14  Yes Courtney Paris, MD  furosemide (LASIX) 40 MG tablet 3 tabs ( ) in the morning and 2 tabs ( ) in the evening 10/04/14  Yes Jaclyn Shaggy, MD  glipiZIDE (GLUCOTROL) 5 MG tablet Take 1 tablet (5 mg total) by mouth 2 (two) times daily before a meal. 04/12/14  Yes [name], MD  metoprolol 75 MG TABS Take 75 mg by mouth 2 (two) times daily. 09/21/14  Yes Jaclyn Shaggy, MD  nicotine (NICODERM CQ) 7 mg/24hr  patch Place 1 patch (7 mg total) onto the skin daily. 08/29/14  Yes Jaclyn Shaggy, MD  rivaroxaban (XARELTO) 20 MG TABS tablet Take 1 tablet (20 mg total) by mouth daily before supper. 10/13/14  Yes Courtney Paris, MD  spironolactone (ALDACTONE) 25 MG tablet Take 1 tablet (25 mg total) by mouth daily. 10/19/14  Yes Gaylord Shih, MD  traMADol (ULTRAM) 50 MG tablet Take 1 tablet (50 mg total) by mouth every 8 (eight) hours as needed for moderate pain. 10/19/14  Yes Jaclyn Shaggy, MD  aspirin EC 81 MG EC tablet Take 1 tablet (81 mg total) by mouth daily. Patient not taking: Reported on 10/29/2014 10/13/14   Courtney Paris, MD  docusate sodium (COLACE) 100 MG capsule Take 1 capsule (100 mg total) by mouth daily. Patient not taking: Reported on 10/19/2014 10/13/14   Courtney Paris, MD  ferrous sulfate 325 (65 FE) MG tablet Take 1 tablet (325 mg total) by mouth 3 (three) times daily with meals. Patient not taking: Reported on 10/19/2014 10/13/14   Courtney Paris, MD   BP 113/61 mmHg  Pulse 62  Temp(Src) 98.3 F (36.8 C) (Oral)  Resp 52  Ht  (1.626 m)  Wt 226 lb 14.4 oz (102.921 kg)  BMI 38.93 kg/m2  SpO2 97% Physical Exam  Constitutional: He is oriented to person, place, and time. He appears well-developed and well-nourished.  HENT:  Head: Normocephalic and atraumatic.  Eyes: EOM are normal.  Neck: Normal range of motion.  Cardiovascular: Intact distal pulses.   Tachycardic.  Irregularly irregular.  Pulmonary/Chest: Effort normal. No respiratory distress.  Mild rails in bases bilaterally.  Abdominal: Soft. He exhibits no distension. There is no tenderness.  Musculoskeletal: Normal range of motion.  Neurological: He is alert and oriented to person, place, and time.  Skin: Skin is warm and dry.  Psychiatric: He has a normal mood and affect. Judgment normal.  Nursing note and vitals reviewed.   ED Course  Procedures (including critical care time)  CRITICAL CARE Performed by: Lyanne Co Total  critical care time: 32 Critical care time was exclusive of separately billable procedures and treating other patients. Critical care was necessary to treat or prevent imminent or life-threatening deterioration. Critical care was time spent personally by me on the following activities: development of treatment plan with patient and/or surrogate as well as nursing, discussions with consultants, evaluation of patient's response to treatment, examination of patient, obtaining history from patient or surrogate, ordering and performing treatments and interventions, ordering and review of laboratory studies, ordering and review of radiographic studies, pulse oximetry and re-evaluation of patient's condition.   Labs Review Labs Reviewed  BASIC METABOLIC PANEL - Abnormal; Notable for the following:    Sodium 130 (*)    Chloride 98 (*)  CO2 21 (*)    Glucose, Bld 260 (*)    BUN 24 (*)    All other components within normal limits  CBC - Abnormal; Notable for the following:    Hemoglobin 10.5 (*)    HCT 36.2 (*)    MCV 67.5 (*)    MCH 19.6 (*)    MCHC 29.0 (*)    RDW 21.8 (*)    All other components within normal limits  PROTIME-INR - Abnormal; Notable for the following:    Prothrombin Time 18.3 (*)    INR 1.51 (*)    All other components within normal limits  TROPONIN I    Imaging Review Dg Chest Port 1 View  10/29/2014   CLINICAL DATA:  59 year old male with a history of previous systolic heart failure. Left-sided chest pain. Shortness of breath.  EXAM: PORTABLE CHEST - 1 VIEW  COMPARISON:  CT 10/11/2014, plain film 10/11/2014, 09/28/2014  FINDINGS: Re- demonstration of cardiomegaly, with similar configuration of the cardiomediastinal silhouette.  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 Unchanged cardiac pacing device on left chest wall with single lead in place. The distal aspect of the lead is not imaged on the plain film.  Atherosclerosis of the aortic arch.  Diffuse  interstitial opacities with interlobular septal thickening. Right basilar opacity with blunting of the right costophrenic angle.  No pneumothorax  IMPRESSION: Evidence of congestive heart failure with pulmonary edema and small right pleural effusion. The effusion appears to be persisted from the comparison study from October 11, 2014.  Surgical changes of median sternotomy and CABG.  Unchanged cardiac pacing device with single lead in place, as above.  Atherosclerosis.  Signed,  Yvone Neu. Loreta Ave, DO  [facility]  [facility] Radiology   Electronically Signed   By: Gilmer Mor D.O.   On: 10/29/2014 13:59  I personally reviewed the imaging tests through PACS system I reviewed available ER/hospitalization records through the EMR    EKG Interpretation   Date/Time:  Saturday October 29 2014 12:54:46 EDT Ventricular Rate:  154 PR Interval:    QRS Duration: 94 QT Interval:  322 QTC Calculation: 515 R Axis:   -59 Text Interpretation:  Atrial fibrillation with rapid ventricular response  with premature ventricular or aberrantly conducted complexes Left anterior  fascicular block Nonspecific T wave abnormality Abnormal ECG afib with  RVR,  Confirmed by Kupono Marling  MD, Laysa Kimmey ([id number]) on 10/29/2014 3:34:51 PM      MDM   Final diagnoses:  Atrial fibrillation with rapid ventricular response  Acute combined systolic and diastolic congestive heart failure  ventricular tachycardia  Likely acute on chronic heart failure.  IV Lasix given.  Medical noncompliance.  Patient with improving heart rate here on a Cardizem drip.  Patient did have a short run of ventricular tachycardia.  We'll continue to observe at this time.  If he has another episode will need to involve cardiology and likely initiate amiodarone.     Azalia Bilis, MD 10/29/14 [phone]

## 2014-10-29 NOTE — H&P (Signed)
Date: 10/29/2014               Patient Name:  Jacob [name] MRN: [id number]  DOB: 09/26/1955 Age / Sex: 59 y.o., male   PCP: [name], MD         Medical Service: Internal Medicine Teaching Service         Attending Physician: Dr. Burns Spain, MD    First Contact: Eyvonne Mechanic, MS3 Pager: [phone]  Second Contact: Dr. Darreld Mclean Pager: [phone]       After Hours (After 5p/  First Contact Pager: [phone]  weekends / holidays): Second Contact Pager: [phone]   Chief Complaint: SOB  History of Present Illness: Mr. Cynthia Stainback is a 59 year old male with PMH of A.fib with RVR on Xarelto, CAD s/p CABG x2, sCHF (echo 08/03/14 EF 20-25%) post ICD, type 2 DM, and PVD who presents with sudden onset of SOB for the last day. Dyspnea is constant, worse in supine position, associated with a dry cough. He also has complaint of a sharp, non-radiating chest pain that was 4-5/10 in intensity that has now resolved. He states that he has gained about 9-10 pounds recently and has complaints of swelling in the legs. Patient reports that he has not taken his medications in the last four days . He reports going to pharmacy and being told to come the next day when medications would be ready to pick up, but he was unable due to transport issues. He was to see Dr. Venetia Night on 10/28/2014 but missed appointment. Patient has several barriers to medical care including transportation, housing, medicine adherence and phone/communication issues. He was admitted to hospital last month with similar symptoms after not taking his medications.  Meds: Current Facility-Administered Medications  Medication Dose Route Frequency Provider Last Rate Last Dose  . acetaminophen (TYLENOL) tablet 650 mg  650 mg Oral Q6H PRN Gust Rung, DO       Or  . acetaminophen (TYLENOL) suppository 650 mg  650 mg Rectal Q6H PRN Gust Rung, DO      . aspirin EC tablet 81 mg  81 mg Oral Daily Gust Rung, DO      . atorvastatin  (LIPITOR) tablet 40 mg  40 mg Oral q1800 Gust Rung, DO      . diltiazem (CARDIZEM) 100 mg in dextrose 5 % 100 mL (1 mg/mL) infusion  5-15 mg/hr Intravenous Continuous Azalia Bilis, MD 15 mL/hr at 10/29/14 1745 15 mg/hr at 10/29/14 1745  . docusate sodium (COLACE) capsule 100 mg  100 mg Oral Daily Gust Rung, DO      . [START ON 10/30/2014] ferrous sulfate tablet 325 mg  325 mg Oral Q breakfast Gust Rung, DO      . [START ON 10/30/2014] furosemide (LASIX) injection 80 mg  80 mg Intravenous Q12H Darreld Mclean, MD      . Melene Muller ON 10/30/2014] insulin aspart (novoLOG) injection 0-15 Units  0-15 Units Subcutaneous TID WC Darreld Mclean, MD      . rivaroxaban (XARELTO) tablet 20 mg  20 mg Oral QAC supper Gust Rung, DO      . sodium chloride 0.9 % injection 3 mL  3 mL Intravenous Q12H Gust Rung, DO      . spironolactone (ALDACTONE) tablet 25 mg  25 mg Oral Daily Gust Rung, DO        Allergies: Allergies as of 10/29/2014  . (No Known Allergies)  Past Medical History  Diagnosis Date  . Hypertension   . Systolic heart failure     EF is 40-45% by echo, December 2013  . Noncompliance   . CAD (coronary artery disease) Sept 2013    s/p cardiac cath showing occlusion of small RCA with collaterals  . Chronic anticoagulation     on coumadin  . Atrial fibrillation   . Peripheral arterial disease   . Automatic implantable cardioverter-defibrillator in situ   . High cholesterol   . Myocardial infarction 2014  . Type II diabetes mellitus   . CHF (congestive heart failure)   . Shortness of breath dyspnea    Past Surgical History  Procedure Laterality Date  . Implantable cardioverter defibrillator implant      [place] in 07/2012; [employer]  . Coronary artery bypass graft  09/2012    2 vessels per patient [name]   . Coronary angioplasty with stent placement  11/2011    "1"  . Cardiac catheterization  09/2012  . Iliac artery stent Right 08/30/2013  . Left and right heart  catheterization with coronary angiogram N/A 12/02/2011    Procedure: LEFT AND RIGHT HEART CATHETERIZATION WITH CORONARY ANGIOGRAM;  Surgeon: Kathleene Hazel, MD;  Location: [facility] CATH LAB;  Service: Cardiovascular;  Laterality: N/A;  . Lower extremity angiogram N/A 08/30/2013    Procedure: LOWER EXTREMITY ANGIOGRAM;  Surgeon: Runell Gess, MD;  Location: [facility] CATH LAB;  Service: Cardiovascular;  Laterality: N/A;  . Lower extremity angiogram N/A 12/02/2013    Procedure: LOWER EXTREMITY ANGIOGRAM;  Surgeon: Runell Gess, MD;  Location: [facility] CATH LAB;  Service: Cardiovascular;  Laterality: N/A;   Family History  Problem Relation Age of Onset  . Diabetes Mother    History   Social History  . Marital Status: Divorced    Spouse Name: N/A  . Number of Children: 3  . Years of Education: N/A   Occupational History  . Unemployed    Social History Main Topics  . Smoking status: Current Every Day Smoker -- 0.50 packs/day for 38 years    Types: Cigarettes  . Smokeless tobacco: Never Used  . Alcohol Use: No  . Drug Use: No  . Sexual Activity: Yes   Other Topics Concern  . Not on file   Social History Narrative   Has an apartment with a roommate. He was living on the streets in 2013-01-07.  He reports that his father died in [place] in January 07, 2013.  He is divorced.  He is no longer estranged from his son, but still from his daughter who lives locally.  Neither of his parents, nor any siblings have any history of CAD.    Review of Systems: Review of Systems  Constitutional: Negative for fever and chills.       Weight gain og 9-10 lbs.  Respiratory: Positive for cough and shortness of breath. Negative for hemoptysis and sputum production.   Cardiovascular: Positive for palpitations, orthopnea and leg swelling.       Sharp non-radiating chest pain which has resolved.  Gastrointestinal: Negative for nausea, vomiting, abdominal pain and diarrhea.  Musculoskeletal: Negative for myalgias.   Neurological: Negative for headaches.     Physical Exam: Blood pressure 131/100, pulse 104, temperature 97.8 F (36.6 C), temperature source Oral, resp. rate 29, height 5\' 4"  (1.626 m), weight 102.921 kg (226 lb 14.4 oz), SpO2 96 %. Physical Exam  Constitutional: He appears well-developed and well-nourished.  HENT:  Head: Normocephalic and atraumatic.  Eyes: EOM are normal.  Neck: JVD present.  Cardiovascular: An irregularly irregular rhythm present. Tachycardia present.   +2 DP pulse on left, +1 DP on right  Pulmonary/Chest: No respiratory distress. He has rales in the right lower field and the left lower field.  Abdominal: Soft. Bowel sounds are normal. He exhibits distension. There is no tenderness.  Musculoskeletal: He exhibits edema.  Neurological: He is alert.  Skin: Skin is warm.  Bilateral LEs have several non-ulcerating sores  Psychiatric: He has a normal mood and affect. His behavior is normal.     Lab results: Basic Metabolic Panel:  Recent Labs  16/10/96 1320  NA 130*  K 4.6  CL 98*  CO2 21*  GLUCOSE 260*  BUN 24*  CREATININE 1.10  CALCIUM 8.9   CBC:  Recent Labs  10/29/14 1320  WBC 8.4  HGB 10.5*  HCT 36.2*  MCV 67.5*  PLT 295   Cardiac Enzymes:  Recent Labs  10/29/14 1320  TROPONINI 0.03   Coagulation:  Recent Labs  10/29/14 1320  LABPROT 18.3*  INR 1.51*   Urine Drug Screen: Drugs of Abuse     Component Value Date/Time   LABOPIA NONE DETECTED 10/11/2014 1017   COCAINSCRNUR NONE DETECTED 10/11/2014 1017   LABBENZ NONE DETECTED 10/11/2014 1017   AMPHETMU NONE DETECTED 10/11/2014 1017   THCU NONE DETECTED 10/11/2014 1017   LABBARB NONE DETECTED 10/11/2014 1017     Imaging results:  Dg Chest Port 1 View  10/29/2014   CLINICAL DATA:  59 year old male with a history of previous systolic heart failure. Left-sided chest pain. Shortness of breath.  EXAM: PORTABLE CHEST - 1 VIEW  COMPARISON:  CT 10/11/2014, plain film 10/11/2014,  09/28/2014  FINDINGS: Re- demonstration of cardiomegaly, with similar configuration of the cardiomediastinal silhouette.  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 Unchanged cardiac pacing device on left chest wall with single lead in place. The distal aspect of the lead is not imaged on the plain film.  Atherosclerosis of the aortic arch.  Diffuse interstitial opacities with interlobular septal thickening. Right basilar opacity with blunting of the right costophrenic angle.  No pneumothorax  IMPRESSION: Evidence of congestive heart failure with pulmonary edema and small right pleural effusion. The effusion appears to be persisted from the comparison study from October 11, 2014.  Surgical changes of median sternotomy and CABG.  Unchanged cardiac pacing device with single lead in place, as above.  Atherosclerosis.  Signed,  Yvone Neu. Loreta Ave, DO  [facility]  [facility] Radiology   Electronically Signed   By: Gilmer Mor D.O.   On: 10/29/2014 13:59    Other results: EKG: A. Fib w/ RVR with premature ventricular or abherrantly conducted complex, 154 bpm, non-specific t-wave abnormalitiy  Assessment & Plan by Problem: Active Problems:   Atrial fibrillation with rapid ventricular response   Acute exacerbation of CHF (congestive heart failure)   Atrial fibrillation with RVR  Atrial fibrillation w/ RVR: Patient has been on Xarelto for anticoagulation and Metoprolol for rate control. EKG showed rate of 154 bpm, rate was 122 during examination. Patient has not taken his medication for the last four days which is likely cause of his increased heart rate above 110. -Started on Diltiazem drip in ED, will continue 5-15 mL/hr -Cardiac monitoring -Continue Xarelto 20 mg daily  Acute exacerbation of CHF: Patient has 1 day history of sudden onset SOB, reported weight gain of 9-10 pounds over uncertain time period. Echo on 08/03/2014  showed EF of 20-25%. He has CAD s/p  CABG and has ICD placed. Patient also has complaint of transient sharp, non-radiating chest pain that has now resolved. On exam, JVD of 9 cm appreciated, +1 pitting edema on bilateral legs, crackles heard on lower lung fields. His non-adherence to medication is most likely cause of his acute exacerbation. BNP today is 1232.4. -IV Lasix 80 mg BID -Continue Aspirin 81 mg daily -Spironolactone 25 mg daily -Atorvastatin 40 mg daily -Stict I/Os, daily weights, vital signs with pulse ox, heart healthy diet, bed rest -BMP daily, monitor creatinine -Trop x1 0.03, will repeat  CAD s/p CABG July 2014: -Aspirin 81 mg -Atorvastatin 40 mg daily  Microcytic anemia: Hgb 10.5 at baseline, MCV 67.5 -Continue ferrous sulfate 325 mg once daily with breakfast  Essential HTN: BPs stable at 120-130s systolic -Will hold home metoprolol  Type 2 Diabetes Mellitus:  -SSI-M -Hold home Glipizide  DVT prophylaxis: Xarelto 20 mg daily  Code: Full  Dispo: Disposition is deferred at this time, awaiting improvement of current medical problems. Anticipated discharge in approximately 2-3 day(s).   The patient does have a current PCP ([name], MD) and does need an [facility] hospital follow-up appointment after discharge.  The patient does have transportation limitations that hinder transportation to clinic appointments.  Signed: Darreld Mclean, MD 10/29/2014, 5:59 PM

## 2014-10-30 DIAGNOSIS — Z5900 Homelessness unspecified: Secondary | ICD-10-CM | POA: Insufficient documentation

## 2014-10-30 DIAGNOSIS — I2510 Atherosclerotic heart disease of native coronary artery without angina pectoris: Secondary | ICD-10-CM | POA: Insufficient documentation

## 2014-10-30 DIAGNOSIS — E119 Type 2 diabetes mellitus without complications: Secondary | ICD-10-CM | POA: Insufficient documentation

## 2014-10-30 LAB — BASIC METABOLIC PANEL
Anion gap: 9 (ref 5–15)
BUN: 23 mg/dL — ABNORMAL HIGH (ref 6–20)
CO2: 26 mmol/L (ref 22–32)
CREATININE: 1.17 mg/dL (ref 0.61–1.24)
Calcium: 8.9 mg/dL (ref 8.9–10.3)
Chloride: 96 mmol/L — ABNORMAL LOW (ref 101–111)
GFR calc Af Amer: >60 mL/min (ref >60)
Glucose, Bld: 206 mg/dL — ABNORMAL HIGH (ref 65–99)
POTASSIUM: 4.1 mmol/L (ref 3.5–5.1)
Sodium: 131 mmol/L — ABNORMAL LOW (ref 135–145)

## 2014-10-30 LAB — GLUCOSE, CAPILLARY
GLUCOSE-CAPILLARY: 116 mg/dL — AB (ref 65–99)
GLUCOSE-CAPILLARY: 227 mg/dL — AB (ref 65–99)
Glucose-Capillary: 295 mg/dL — ABNORMAL HIGH (ref 65–99)
Glucose-Capillary: 256 mg/dL — ABNORMAL HIGH (ref 65–99)

## 2014-10-30 LAB — TROPONIN I: Troponin I: 0.05 ng/mL — ABNORMAL HIGH (ref <0.031)

## 2014-10-30 MED ORDER — TRAMADOL HCL 50 MG PO TABS
50.0000 mg | ORAL_TABLET | Freq: Four times a day (QID) | ORAL | Status: DC
  Administered 2014-10-30 – 2014-10-31 (×5): 50 mg via ORAL
  Filled 2014-10-30 (×5): qty 1

## 2014-10-30 MED ORDER — DILTIAZEM HCL 60 MG PO TABS
60.0000 mg | ORAL_TABLET | Freq: Four times a day (QID) | ORAL | Status: DC
  Administered 2014-10-30 – 2014-10-31 (×5): 60 mg via ORAL
  Filled 2014-10-30 (×8): qty 1

## 2014-10-30 MED ORDER — INSULIN GLARGINE 100 UNIT/ML ~~LOC~~ SOLN
10.0000 [IU] | Freq: Every day | SUBCUTANEOUS | Status: DC
  Administered 2014-10-30: 10 [IU] via SUBCUTANEOUS
  Filled 2014-10-30 (×2): qty 0.1

## 2014-10-30 NOTE — Progress Notes (Signed)
  Date: 10/30/2014  Patient name: Jacob [name]  Medical record number: [id number]  Date of birth: 02/09/1956   I have seen and evaluated Jacob [name] and discussed their care with the Residency Team. Mr Jacob [name] is a 59 yo man with A Fib with RVR on Xarelto, CAD s/p CABG, systolic HF (echo 08/03/14 EF 20-25%) post ICD, type 2 DM, and PVD. He has had 5 admits in the past 6 months, all for cardiac issues and admitted for same. He is homeless and is not interested in shelters 2/2 alleged drug use in the facilities. He stays in a car or with friends. He missed his appt at [facility] on 8/5 and has been out of his med for 4 days. He has dyspnea and a dry cough. He was found to be in A Fib with RVR and was started on a cardiazem drip for rate control. He was also diuresed with IV lasix. He has lost 6 lbs reportedly and is net negative 1.6 L,. He has R/O for ACS.  This AM, he is sitting in recliner and states his dyspnea is better. He has no other complaints.  Filed Vitals:   10/30/14 1119  BP: 130/77  Pulse:   Temp: 97.8 F (36.6 C)  Resp: 18   Gen pleasant, NAD, speaking in full sentences, H irr irr tachy LCTAB Ext non pitting edema  Assessment and Plan: I have seen and evaluated the patient as outlined above. I agree with the formulated Assessment and Plan as detailed in the residents' admission note, with the following changes:   1. A Fib with RVR - 2/2 not taking meds. He is rate controlled (mostly) on cardiazem gtt and will be transitioned to PO CCB. Reason not to start BB in transition is bc he was not taking the BB prior to admit and is in Acute HF exac. He will need to resume the BB once vol stable.  2. Acute on chronic systolic heart failure exac - he has appropriately diuresed and is being cont on IV lasix. His cr is stable. Cont to follow the I/O and Cr. The cause is med non compliance. He R/O for ACS. TSH was nl in May 2016.  3. Homelessness - social work consult.   Burns Spain,  MD 8/7/20163:09 PM

## 2014-10-30 NOTE — Progress Notes (Signed)
Subjective: Patient states he is feeling better today. His breathing has improved and he has no cough. He slept in the chair last night instead of the bed however, stating that it made his breathing easier. He noted that he had some chest pain last night that had since resolved, and he informed the team that it coincided with the abdominal pain that he told the nurse about. He stated that the acetaminophen helped resolve the pain. He thinks that his swelling has improved slightly but that his LEs are still larger and more edematous than normal.  Objective: Vital signs in last 24 hours: Filed Vitals:   10/30/14 0151 10/30/14 0400 10/30/14 0525 10/30/14 0800  BP: 131/93 116/83 119/66 137/91  Pulse:  87  87  Temp:  98.3 F (36.8 C)  97.6 F (36.4 C)  TempSrc:  Oral  Oral  Resp:  20    Height:      Weight:  100.2 kg (220 lb 14.4 oz)    SpO2:  92%     Weight change:   Intake/Output Summary (Last 24 hours) at 10/30/14 1012 Last data filed at 10/30/14 1000  Gross per 24 hour  Intake   1080 ml  Output   2725 ml  Net  -1645 ml   BP 137/91 mmHg  Pulse 87  Temp(Src) 97.6 F (36.4 C) (Oral)  Resp 20  Ht 5\' 4"  (1.626 m)  Wt 100.2 kg (220 lb 14.4 oz)  BMI 37.90 kg/m2  SpO2 92% General appearance: alert, cooperative, appears stated age and no distress Lungs: clear to auscultation bilaterally and normal work of breathing Heart: irregularly irregular rhythm, S1, S2 normal, no S3 or S4 Extremities: edema 2+ present bilaterally in LE Pulses: 2+ and symmetric Skin: no evidence of bleeding or bruising and temperature normal. Scaly lesions present on LE bilaterally Lab Results: Results for orders placed or performed during the hospital encounter of 10/29/14 (from the past 24 hour(s))  Basic metabolic panel     Status: Abnormal   Collection Time: 10/29/14  1:20 PM  Result Value Ref Range   Sodium 130 (L) 135 - 145 mmol/L   Potassium 4.6 3.5 - 5.1 mmol/L   Chloride 98 (L) 101 - 111 mmol/L     CO2 21 (L) 22 - 32 mmol/L   Glucose, Bld 260 (H) 65 - 99 mg/dL   BUN 24 (H) 6 - 20 mg/dL   Creatinine, Ser 3.57 0.61 - 1.24 mg/dL   Calcium 8.9 8.9 - 01.7 mg/dL   GFR calc non Af Amer >60 >60 mL/min   GFR calc Af Amer >60 >60 mL/min   Anion gap 11 5 - 15  CBC     Status: Abnormal   Collection Time: 10/29/14  1:20 PM  Result Value Ref Range   WBC 8.4 4.0 - 10.5 K/uL   RBC 5.36 4.22 - 5.81 MIL/uL   Hemoglobin 10.5 (L) 13.0 - 17.0 g/dL   HCT 79.3 (L) 90.3 - 00.9 %   MCV 67.5 (L) 78.0 - 100.0 fL   MCH 19.6 (L) 26.0 - 34.0 pg   MCHC 29.0 (L) 30.0 - 36.0 g/dL   RDW 23.3 (H) 00.7 - 62.2 %   Platelets 295 150 - 400 K/uL  Troponin I     Status: None   Collection Time: 10/29/14  1:20 PM  Result Value Ref Range   Troponin I 0.03 <0.031 ng/mL  Protime-INR - (order if Patient is taking Coumadin / Warfarin)  Status: Abnormal   Collection Time: 10/29/14  1:20 PM  Result Value Ref Range   Prothrombin Time 18.3 (H) 11.6 - 15.2 seconds   INR 1.51 (H) 0.00 - 1.49  Brain natriuretic peptide     Status: Abnormal   Collection Time: 10/29/14  3:40 PM  Result Value Ref Range   B Natriuretic Peptide 1232.4 (H) 0.0 - 100.0 pg/mL  MRSA PCR Screening     Status: None   Collection Time: 10/29/14  5:42 PM  Result Value Ref Range   MRSA by PCR NEGATIVE NEGATIVE  Glucose, capillary     Status: Abnormal   Collection Time: 10/29/14  6:00 PM  Result Value Ref Range   Glucose-Capillary 184 (H) 65 - 99 mg/dL   Comment 1 Capillary Specimen   Glucose, capillary     Status: Abnormal   Collection Time: 10/29/14 11:01 PM  Result Value Ref Range   Glucose-Capillary 242 (H) 65 - 99 mg/dL   Comment 1 Capillary Specimen   Basic metabolic panel     Status: Abnormal   Collection Time: 10/30/14  5:56 AM  Result Value Ref Range   Sodium 131 (L) 135 - 145 mmol/L   Potassium 4.1 3.5 - 5.1 mmol/L   Chloride 96 (L) 101 - 111 mmol/L   CO2 26 22 - 32 mmol/L   Glucose, Bld 206 (H) 65 - 99 mg/dL   BUN 23 (H) 6 -  20 mg/dL   Creatinine, Ser 1.61 0.61 - 1.24 mg/dL   Calcium 8.9 8.9 - 09.6 mg/dL   GFR calc non Af Amer >60 >60 mL/min   GFR calc Af Amer >60 >60 mL/min   Anion gap 9 5 - 15  Troponin I     Status: Abnormal   Collection Time: 10/30/14  5:56 AM  Result Value Ref Range   Troponin I 0.05 (H) <0.031 ng/mL  Glucose, capillary     Status: Abnormal   Collection Time: 10/30/14  7:32 AM  Result Value Ref Range   Glucose-Capillary 295 (H) 65 - 99 mg/dL   Comment 1 Capillary Specimen     Micro Results: Recent Results (from the past 240 hour(s))  MRSA PCR Screening     Status: None   Collection Time: 10/29/14  5:42 PM  Result Value Ref Range Status   MRSA by PCR NEGATIVE NEGATIVE Final    Comment:        The GeneXpert MRSA Assay (FDA approved for NASAL specimens only), is one component of a comprehensive MRSA colonization surveillance program. It is not intended to diagnose MRSA infection nor to guide or monitor treatment for MRSA infections.    Studies/Results: Dg Chest Port 1 View  10/29/2014   CLINICAL DATA:  59 year old male with a history of previous systolic heart failure. Left-sided chest pain. Shortness of breath.  EXAM: PORTABLE CHEST - 1 VIEW  COMPARISON:  CT 10/11/2014, plain film 10/11/2014, 09/28/2014  FINDINGS: Re- demonstration of cardiomegaly, with similar configuration of the cardiomediastinal silhouette.  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 Unchanged cardiac pacing device on left chest wall with single lead in place. The distal aspect of the lead is not imaged on the plain film.  Atherosclerosis of the aortic arch.  Diffuse interstitial opacities with interlobular septal thickening. Right basilar opacity with blunting of the right costophrenic angle.  No pneumothorax  IMPRESSION: Evidence of congestive heart failure with pulmonary edema and small right pleural effusion. The effusion appears to be persisted from the comparison  study from October 11, 2014.   Surgical changes of median sternotomy and CABG.  Unchanged cardiac pacing device with single lead in place, as above.  Atherosclerosis.  Signed,  Yvone Neu. Loreta Ave, DO  [facility]  [facility]   Electronically Signed   By: Gilmer Mor D.O.   On: 10/29/2014 13:59   Medications: I have reviewed the patient's current medications. Scheduled Meds: . aspirin EC  81 mg Oral Daily  . atorvastatin  40 mg Oral q1800  . docusate sodium  100 mg Oral Daily  . ferrous sulfate  325 mg Oral Q breakfast  . furosemide  80 mg Intravenous Q12H  . insulin aspart  0-15 Units Subcutaneous TID WC  . rivaroxaban  20 mg Oral QAC supper  . sodium chloride  3 mL Intravenous Q12H  . spironolactone  25 mg Oral Daily   Continuous Infusions: . diltiazem (CARDIZEM) infusion 15 mg/hr (10/30/14 0525)   PRN Meds:.acetaminophen **OR** acetaminophen suppository Assessment/Plan: Active Problems:   Atrial fibrillation with rapid ventricular response   Acute exacerbation of CHF (congestive heart failure)   Atrial fibrillation with RVR   Acute combined systolic and diastolic congestive heart failure   Chest pain  Acute on chronic CHF: Considering the recent lapse in medication and constellation of symptoms that resemble previous hospitalizations, acute on chronic CHF exacerbation is our most likely diagnosis. Even though his Xarelto had also lapsed, it would be rare to see a bilateral DVT to cause the lower extremity edema. - Pro-BNP ~1200 on admission (last admission 1100) - Daily Weights - Strict I/Os - Lasix IV  BID, net goal -1 to 2L - Continue spironolactone  daily - Consider starting ACE inhibitor in outpatient followup - Monitor kidney function (SCr 1.10 -> 1.17 today)  Atrial Fibrillation with RVR: - Diltiazem  IV for rate control this morning, will transition to PO form today - Continue home Xarelto  daily - Telemetry monitoring  CAD:  -  Continue home atorvastation  daily - Continue home ASA  daily  DM:  -  A1C 8.4% in May 2016 - Hold home glipizide - SSI  FEN/GI: -Diet: Heart Healthy - Replace electrolytes as needed (K<4, Mg<2)  Prophylaxis: Xarelto  Code: Full  Dispo: Patient homeless upon last discharge  This is a Psychologist, occupational Note.  The care of the patient was discussed with Dr. Darreld Mclean and the assessment and plan formulated with their assistance.  Please see their attached note for official documentation of the daily encounter.   LOS: 1 day   Eyvonne Mechanic, Med Student 10/30/2014, 10:12 AM

## 2014-10-30 NOTE — Progress Notes (Signed)
Transferred to [id number] by wheelchair, stable, report given to RN, belongings with pt.

## 2014-10-30 NOTE — Progress Notes (Signed)
Pt c/o abdominal pain 7/10 will try tylenol prn.  Will continue to monitor. Jacob [name]

## 2014-10-30 NOTE — Progress Notes (Signed)
Utilization review completed.  

## 2014-10-30 NOTE — Care Management Note (Addendum)
Case Management Note  Patient Details  Name: Hakim Minniefield MRN: [id number] Date of Birth: May 01, 1955  Subjective/Objective:                   Atrial fibrillation with rapid ventricular response  Acute exacerbation of CHF (congestive heart failure) Action/Plan: Discharge planning  Expected Discharge Date:                  Expected Discharge Plan:  Home/Self Care  In-House Referral:     Discharge planning Services  CM Consult, [facility]  Post Acute Care Choice:    Choice offered to:     DME Arranged:    DME Agency:     HH Arranged:    [place] Agency:     Status of Service:  Completed, signed off  Medicare Important Message Given:    Date Medicare IM Given:    Medicare IM give by:    Date Additional Medicare IM Given:    Additional Medicare Important Message give by:     If discussed at [place] of Stay Meetings, dates discussed:    Additional Comments: CM met with [facility] for followup (August16 at 3:00pm with Dr. Jarold Song)  and transportation information provided as he is a MEDICAID pt. CM requested discharging MD escribe/fax prescriptions to [facility]  and discharge pt prior to 14:00pm so pt has enough time to pick up prescriptions before it closes at 17:00. CM called CSW, Butch Penny who states she is very familiar with pt.  Pt is homeless at this time and CSW will explore shelter availability.  No other CM needs were communicated. Dellie Catholic, RN 10/30/2014, 10:26 AM

## 2014-10-30 NOTE — Progress Notes (Signed)
Subjective: Patient had complaint of some abdominal pain last night which was relieved by tylenol. No current abdominal or chest pain. He states that he is feeling better with his breathing improved, no longer coughing. He slept in a chair overnight as the bed is uncomfortable for him. No other events overnight. Objective: Vital signs in last 24 hours: Filed Vitals:   10/30/14 0400 10/30/14 0525 10/30/14 0800 10/30/14 1119  BP: 116/83 119/66 137/91 130/77  Pulse: 87  87   Temp: 98.3 F (36.8 C)  97.6 F (36.4 C) 97.8 F (36.6 C)  TempSrc: Oral  Oral Oral  Resp: 20   18  Height:      Weight: 100.2 kg (220 lb 14.4 oz)     SpO2: 92%   94%   Weight change: 226 to 220 lbs  Intake/Output Summary (Last 24 hours) at 10/30/14 1157 Last data filed at 10/30/14 1000  Gross per 24 hour  Intake   1080 ml  Output   2725 ml  Net  -1645 ml   General: resting in chair, no acute distress HEENT: EOMI, no scleral icterus Cardiac: irregularly irregular, no rubs, murmurs or gallops, rate normal Pulm: clear to auscultation bilaterally, moving normal volumes of air, no respiratory distress Abd: soft, nontender, nondistended, BS present Ext: warm and well perfused, edema bilateral lower extremities that is improved somewhat since yesterday, non-ulcerating skin sores bilataral LE Neuro: alert and oriented X3  Lab Results: Basic Metabolic Panel:  Recent Labs Lab 10/29/14 1320 10/30/14 0556  NA 130* 131*  K 4.6 4.1  CL 98* 96*  CO2 21* 26  GLUCOSE 260* 206*  BUN 24* 23*  CREATININE 1.10 1.17  CALCIUM 8.9 8.9   CBC:  Recent Labs Lab 10/29/14 1320  WBC 8.4  HGB 10.5*  HCT 36.2*  MCV 67.5*  PLT 295   Cardiac Enzymes:  Recent Labs Lab 10/29/14 1320 10/30/14 0556  TROPONINI 0.03 0.05*   CBG:  Recent Labs Lab 10/29/14 1800 10/29/14 2301 10/30/14 0732  GLUCAP 184* 242* 295*   Coagulation:  Recent Labs Lab 10/29/14 1320  LABPROT 18.3*  INR 1.51*   Urine Drug  Screen: Drugs of Abuse     Component Value Date/Time   LABOPIA NONE DETECTED 10/11/2014 1017   COCAINSCRNUR NONE DETECTED 10/11/2014 1017   LABBENZ NONE DETECTED 10/11/2014 1017   AMPHETMU NONE DETECTED 10/11/2014 1017   THCU NONE DETECTED 10/11/2014 1017   LABBARB NONE DETECTED 10/11/2014 1017     Micro Results: Recent Results (from the past 240 hour(s))  MRSA PCR Screening     Status: None   Collection Time: 10/29/14  5:42 PM  Result Value Ref Range Status   MRSA by PCR NEGATIVE NEGATIVE Final    Comment:        The GeneXpert MRSA Assay (FDA approved for NASAL specimens only), is one component of a comprehensive MRSA colonization surveillance program. It is not intended to diagnose MRSA infection nor to guide or monitor treatment for MRSA infections.    Studies/Results: Dg Chest Port 1 View  10/29/2014   CLINICAL DATA:  59 year old male with a history of previous systolic heart failure. Left-sided chest pain. Shortness of breath.  EXAM: PORTABLE CHEST - 1 VIEW  COMPARISON:  CT 10/11/2014, plain film 10/11/2014, 09/28/2014  FINDINGS: Re- demonstration of cardiomegaly, with similar configuration of the cardiomediastinal silhouette.  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 Unchanged cardiac pacing device on left chest wall with single lead in place.  The distal aspect of the lead is not imaged on the plain film.  Atherosclerosis of the aortic arch.  Diffuse interstitial opacities with interlobular septal thickening. Right basilar opacity with blunting of the right costophrenic angle.  No pneumothorax  IMPRESSION: Evidence of congestive heart failure with pulmonary edema and small right pleural effusion. The effusion appears to be persisted from the comparison study from October 11, 2014.  Surgical changes of median sternotomy and CABG.  Unchanged cardiac pacing device with single lead in place, as above.  Atherosclerosis.  Signed,  Yvone Neu. Loreta Ave, DO  [facility]  [facility]   Electronically Signed   By: Gilmer Mor D.O.   On: 10/29/2014 13:59   Medications: I have reviewed the patient's current medications. Scheduled Meds: . aspirin EC  81 mg Oral Daily  . atorvastatin  40 mg Oral q1800  . diltiazem  60 mg Oral 4 times per day  . docusate sodium  100 mg Oral Daily  . ferrous sulfate  325 mg Oral Q breakfast  . furosemide  80 mg Intravenous Q12H  . insulin aspart  0-15 Units Subcutaneous TID WC  . rivaroxaban  20 mg Oral QAC supper  . sodium chloride  3 mL Intravenous Q12H  . spironolactone  25 mg Oral Daily   Continuous Infusions:   PRN Meds:.acetaminophen **OR** acetaminophen Assessment/Plan: Active Problems:   Atrial fibrillation with rapid ventricular response   Acute exacerbation of CHF (congestive heart failure)   Atrial fibrillation with RVR   Acute combined systolic and diastolic congestive heart failure   Chest pain  A. Fib w/ RVR: Patient's rate is controlled less than 110 bpm since last night.  -Change Diltiazem from IV to PO 60 mg every 6 hours. Will continue home Metoprolol on discharge. -Continue Xarelto 20 mg daily  Acute exacerbation of CHF: Patient's breathing is improved since yesterday. No crackles/rales heard on lung exam. Is still noted to have edema of bilateral legs, somewhat improved since yesterday. No remarkable changes in BMET compared to yesterday. Repeat Trop is 0.05. Net output since admission is -1645 mL. Weight is down to 220 lbs compared to 226 lbs on admission. 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  Anticipated discharge in approximately 1-2 day(s).   The patient does have a current PCP ([name], MD) and does not need an [facility] hospital follow-up appointment after discharge.  The patient does have transportation limitations that hinder transportation to clinic appointments.  .Services Needed at time of discharge: Y = Yes, Blank = No PT:   OT:   RN:   Equipment:   Other:     LOS: 1 day   Darreld Mclean, MD 10/30/2014, 11:57 AM

## 2014-10-31 ENCOUNTER — Telehealth: Payer: Self-pay

## 2014-10-31 DIAGNOSIS — I2583 Coronary atherosclerosis due to lipid rich plaque: Secondary | ICD-10-CM

## 2014-10-31 DIAGNOSIS — I2510 Atherosclerotic heart disease of native coronary artery without angina pectoris: Secondary | ICD-10-CM | POA: Insufficient documentation

## 2014-10-31 LAB — BASIC METABOLIC PANEL
ANION GAP: 10 (ref 5–15)
BUN: 27 mg/dL — ABNORMAL HIGH (ref 6–20)
CALCIUM: 8.8 mg/dL — AB (ref 8.9–10.3)
CO2: 28 mmol/L (ref 22–32)
CREATININE: 1.27 mg/dL — AB (ref 0.61–1.24)
Chloride: 93 mmol/L — ABNORMAL LOW (ref 101–111)
GFR calc Af Amer: >60 mL/min (ref >60)
GFR calc non Af Amer: >60 mL/min (ref >60)
Glucose, Bld: 169 mg/dL — ABNORMAL HIGH (ref 65–99)
Potassium: 4.7 mmol/L (ref 3.5–5.1)
Sodium: 131 mmol/L — ABNORMAL LOW (ref 135–145)

## 2014-10-31 LAB — GLUCOSE, CAPILLARY
GLUCOSE-CAPILLARY: 194 mg/dL — AB (ref 65–99)
Glucose-Capillary: 196 mg/dL — ABNORMAL HIGH (ref 65–99)

## 2014-10-31 MED ORDER — FUROSEMIDE 10 MG/ML IJ SOLN
80.0000 mg | Freq: Once | INTRAMUSCULAR | Status: AC
  Administered 2014-10-31: 80 mg via INTRAVENOUS

## 2014-10-31 NOTE — Progress Notes (Addendum)
Subjective: He is feeling better today, breathing improved. No chest pain, fevers, or chills. No acute events overnight. Has appointment with Dr. Venetia Night tomorrow at [facility] with transportation set for him. Objective: Vital signs in last 24 hours: Filed Vitals:   10/30/14 1653 10/30/14 1803 10/30/14 2102 10/31/14 0510  BP: 124/90 137/70 126/69 117/78  Pulse: 91 96 90 101  Temp: 97.8 F (36.6 C) 97.3 F (36.3 C) 97.8 F (36.6 C) 98.7 F (37.1 C)  TempSrc: Oral Oral Oral Oral  Resp: 17 19 18 18   Height:      Weight:    218 lb 12.8 oz (99.247 kg)  SpO2: 96% 95% 95% 97%   Weight change: -8 lb 1.6 oz (-3.674 kg)226 to 220 lbs  Intake/Output Summary (Last 24 hours) at 10/31/14 1137 Last data filed at 10/31/14 0819  Gross per 24 hour  Intake    430 ml  Output   1050 ml  Net   -620 ml   General: resting in chair, no acute distress HEENT: EOMI, no scleral icterus Cardiac: irregularly irregular, no rubs, murmurs or gallops, rate normal Pulm: clear to auscultation bilaterally, moving normal volumes of air, no respiratory distress Abd: soft, nontender, nondistended, BS present Ext: warm and well perfused, edema bilateral lower extremities, non-ulcerating skin sores bilataral LE Neuro: alert and oriented X3  Lab Results: Basic Metabolic Panel:  Recent Labs Lab 10/30/14 0556 10/31/14 0303  NA 131* 131*  K 4.1 4.7  CL 96* 93*  CO2 26 28  GLUCOSE 206* 169*  BUN 23* 27*  CREATININE 1.17 1.27*  CALCIUM 8.9 8.8*   CBC:  Recent Labs Lab 10/29/14 1320  WBC 8.4  HGB 10.5*  HCT 36.2*  MCV 67.5*  PLT 295   Cardiac Enzymes:  Recent Labs Lab 10/29/14 1320 10/30/14 0556  TROPONINI 0.03 0.05*   CBG:  Recent Labs Lab 10/29/14 2301 10/30/14 0732 10/30/14 1124 10/30/14 1653 10/30/14 2128 10/31/14 0538  GLUCAP 242* 295* 256* 116* 227* 194*   Coagulation:  Recent Labs Lab 10/29/14 1320  LABPROT 18.3*  INR 1.51*   Urine Drug Screen: Drugs of Abuse       Component Value Date/Time   LABOPIA NONE DETECTED 10/11/2014 1017   COCAINSCRNUR NONE DETECTED 10/11/2014 1017   LABBENZ NONE DETECTED 10/11/2014 1017   AMPHETMU NONE DETECTED 10/11/2014 1017   THCU NONE DETECTED 10/11/2014 1017   LABBARB NONE DETECTED 10/11/2014 1017     Micro Results: Recent Results (from the past 240 hour(s))  MRSA PCR Screening     Status: None   Collection Time: 10/29/14  5:42 PM  Result Value Ref Range Status   MRSA by PCR NEGATIVE NEGATIVE Final    Comment:        The GeneXpert MRSA Assay (FDA approved for NASAL specimens only), is one component of a comprehensive MRSA colonization surveillance program. It is not intended to diagnose MRSA infection nor to guide or monitor treatment for MRSA infections.    Studies/Results: Dg Chest Port 1 View  10/29/2014   CLINICAL DATA:  59 year old male with a history of previous systolic heart failure. Left-sided chest pain. Shortness of breath.  EXAM: PORTABLE CHEST - 1 VIEW  COMPARISON:  CT 10/11/2014, plain film 10/11/2014, 09/28/2014  FINDINGS: Re- demonstration of cardiomegaly, with similar configuration of the cardiomediastinal silhouette.  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 Unchanged cardiac pacing device on left chest wall with single lead in place. The distal aspect of the lead is  not imaged on the plain film.  Atherosclerosis of the aortic arch.  Diffuse interstitial opacities with interlobular septal thickening. Right basilar opacity with blunting of the right costophrenic angle.  No pneumothorax  IMPRESSION: Evidence of congestive heart failure with pulmonary edema and small right pleural effusion. The effusion appears to be persisted from the comparison study from October 11, 2014.  Surgical changes of median sternotomy and CABG.  Unchanged cardiac pacing device with single lead in place, as above.  Atherosclerosis.  Signed,  Yvone Neu. Loreta Ave, DO  [facility]   [facility]   Electronically Signed   By: Gilmer Mor D.O.   On: 10/29/2014 13:59   Medications: I have reviewed the patient's current medications. Scheduled Meds: . aspirin EC  81 mg Oral Daily  . atorvastatin  40 mg Oral q1800  . diltiazem  60 mg Oral 4 times per day  . docusate sodium  100 mg Oral Daily  . ferrous sulfate  325 mg Oral Q breakfast  . furosemide  80 mg Intravenous Once  . insulin aspart  0-15 Units Subcutaneous TID WC  . insulin glargine  10 Units Subcutaneous QHS  . rivaroxaban  20 mg Oral QAC supper  . sodium chloride  3 mL Intravenous Q12H  . spironolactone  25 mg Oral Daily  . traMADol  50 mg Oral 4 times per day   Continuous Infusions:   PRN Meds:.acetaminophen **OR** acetaminophen Assessment/Plan: Active Problems:   Atrial fibrillation with rapid ventricular response   Acute exacerbation of CHF (congestive heart failure)   Atrial fibrillation with RVR   Acute combined systolic and diastolic congestive heart failure   Chest pain   Homelessness   Type 2 diabetes mellitus with other circulatory complications   Coronary artery disease involving native coronary artery of native heart without angina pectoris  A. Fib w/ RVR: Patient's rate is controlled less than 110 bpm since last night on oral diltiazem 60 mg q6h. CHADSVASC score is 4, yearly risk of stroke 4.0%, will need to continue oral anticoagulation. -Continue Xarelto 20 mg daily -Switch Diltiazem to home Metoprolol 75 mg BID on discharge  Acute exacerbation of CHF: Patient's breathing is improved. No crackles/rales heard on lung exam. Continues to have edema of bilateral legs. Output overnight is -620 mL. Weight is down to 218 lbs compared to 226 lbs on admission. -IV lasix 80 mg once more today before discharge -Continue Aspirin 81 mg daily -Continue Spirinolactone 25 mg daily -Continue Atorvastatin 40 mg daily -Strict I/Os, daily weights, vital signs w/ pulse ox, heart healthy  diet -ACE-I not started during hospital stay due increased creatinine and occasional low BPs. He is scheduled for follow up outpatient visit tomorrow, will defer addition of ACE-I to his provider.  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 He is scheduled for follow up outpatient visit tomorrow, will defer addition of ACE-I to his provider.   T2DM: -SSI-M -Hold home Glipizide, consider Metformin after discharge  DVT prophylaxis: Xarelto 20 mg daily  Code: Full   Dispo: Anticipated discharge today.   The patient does have a current PCP ([name], MD) and does not need an [facility] hospital follow-up appointment after discharge.  The patient does have transportation limitations that  hinder transportation to clinic appointments.  .Services Needed at time of discharge: Y = Yes, Blank = No PT:   OT:   RN:   Equipment:   Other:     LOS: 2 days   Darreld Mclean, MD 10/31/2014, 11:37 AM

## 2014-10-31 NOTE — Discharge Instructions (Addendum)
We are discharging you on your home medications. I am sending your prescriptions to [facility]. Please pick these up as soon as possible.  You have a follow up appointment with Dr. Venetia Night on Tuesday, 11/08/2014 at 3PM. Please call the Medicaid Transportation number given to you so they can give you a ride to your appointment.  Atrial Fibrillation Atrial fibrillation is a type of irregular heart rhythm (arrhythmia). During atrial fibrillation, the upper chambers of the heart (atria) quiver continuously in a chaotic pattern. This causes an irregular and often rapid heart rate.  Atrial fibrillation is the result of the heart becoming overloaded with disorganized signals that tell it to beat. These signals are normally released one at a time by a part of the right atrium called the sinoatrial node. They then travel from the atria to the lower chambers of the heart (ventricles), causing the atria and ventricles to contract and pump blood as they pass. In atrial fibrillation, parts of the atria outside of the sinoatrial node also release these signals. This results in two problems. First, the atria receive so many signals that they do not have time to fully contract. Second, the ventricles, which can only receive one signal at a time, beat irregularly and out of rhythm with the atria.  There are three types of atrial fibrillation:   Paroxysmal. Paroxysmal atrial fibrillation starts suddenly and stops on its own within a week.  Persistent. Persistent atrial fibrillation lasts for more than a week. It may stop on its own or with treatment.  Permanent. Permanent atrial fibrillation does not go away. Episodes of atrial fibrillation may lead to permanent atrial fibrillation. Atrial fibrillation can prevent your heart from pumping blood normally. It increases your risk of stroke and can lead to heart failure.  CAUSES   Heart conditions, including a heart attack, heart failure, coronary artery  disease, and heart valve conditions.   Inflammation of the sac that surrounds the heart (pericarditis).  Blockage of an artery in the lungs (pulmonary embolism).  Pneumonia or other infections.  Chronic lung disease.  Thyroid problems, especially if the thyroid is overactive (hyperthyroidism).  Caffeine, excessive alcohol use, and use of some illegal drugs.   Use of some medicines, including certain decongestants and diet pills.  Heart surgery.   Birth defects.  Sometimes, no cause can be found. When this happens, the atrial fibrillation is called lone atrial fibrillation. The risk of complications from atrial fibrillation increases if you have lone atrial fibrillation and you are age 20 years or older. RISK FACTORS  Heart failure.  Coronary artery disease.  Diabetes mellitus.   High blood pressure (hypertension).   Obesity.   Other arrhythmias.   Increased age. SIGNS AND SYMPTOMS   A feeling that your heart is beating rapidly or irregularly.   A feeling of discomfort or pain in your chest.   Shortness of breath.   Sudden light-headedness or weakness.   Getting tired easily when exercising.   Urinating more often than normal (mainly when atrial fibrillation first begins).  In paroxysmal atrial fibrillation, symptoms may start and suddenly stop. DIAGNOSIS  Your health care provider may be able to detect atrial fibrillation when taking your pulse. Your health care provider may have you take a test called an ambulatory electrocardiogram (ECG). An ECG records your heartbeat patterns over a 24-hour period. You may also have other tests, such as:  Transthoracic echocardiogram (TTE). During echocardiography, sound waves are used to evaluate how blood flows through  your heart.  Transesophageal echocardiogram (TEE).  Stress test. There is more than one type of stress test. If a stress test is needed, ask your health care provider about which type is best  for you.  Chest X-ray exam.  Blood tests.  Computed tomography (CT). TREATMENT  Treatment may include:  Treating any underlying conditions. For example, if you have an overactive thyroid, treating the condition may correct atrial fibrillation.  Taking medicine. Medicines may be given to control a rapid heart rate or to prevent blood clots, heart failure, or a stroke.  Having a procedure to correct the rhythm of the heart:  Electrical cardioversion. During electrical cardioversion, a controlled, low-energy shock is delivered to the heart through your skin. If you have chest pain, very low blood pressure, or sudden heart failure, this procedure may need to be done as an emergency.  Catheter ablation. During this procedure, heart tissues that send the signals that cause atrial fibrillation are destroyed.  Surgical ablation. During this surgery, thin lines of heart tissue that carry the abnormal signals are destroyed. This procedure can either be an open-heart surgery or a minimally invasive surgery. With the minimally invasive surgery, small cuts are made to access the heart instead of a large opening.  Pulmonary venous isolation. During this surgery, tissue around the veins that carry blood from the lungs (pulmonary veins) is destroyed. This tissue is thought to carry the abnormal signals. HOME CARE INSTRUCTIONS   Take medicines only as directed by your health care provider. Some medicines can make atrial fibrillation worse or recur.  If blood thinners were prescribed by your health care provider, take them exactly as directed. Too much blood-thinning medicine can cause bleeding. If you take too little, you will not have the needed protection against stroke and other problems.  Perform blood tests at home if directed by your health care provider. Perform blood tests exactly as directed.  Quit smoking if you smoke.  Do not drink alcohol.  Do not drink caffeinated beverages such as  coffee, soda, and some teas. You may drink decaffeinated coffee, soda, or tea.   Maintain a healthy weight.Do not use diet pills unless your health care provider approves. They may make heart problems worse.   Follow diet instructions as directed by your health care provider.  Exercise regularly as directed by your health care provider.  Keep all follow-up visits as directed by your health care provider. This is important. PREVENTION  The following substances can cause atrial fibrillation to recur:   Caffeinated beverages.  Alcohol.  Certain medicines, especially those used for breathing problems.  Certain herbs and herbal medicines, such as those containing ephedra or ginseng.  Illegal drugs, such as cocaine and amphetamines. Sometimes medicines are given to prevent atrial fibrillation from recurring. Proper treatment of any underlying condition is also important in helping prevent recurrence.  SEEK MEDICAL CARE IF:  You notice a change in the rate, rhythm, or strength of your heartbeat.  You suddenly begin urinating more frequently.  You tire more easily when exerting yourself or exercising. SEEK IMMEDIATE MEDICAL CARE IF:   You have chest pain, abdominal pain, sweating, or weakness.  You feel nauseous.  You have shortness of breath.  You suddenly have swollen feet and ankles.  You feel dizzy.  Your face or limbs feel numb or weak.  You have a change in your vision or speech. MAKE SURE YOU:   Understand these instructions.  Will watch your condition.  Will get help  right away if you are not doing well or get worse. Document Released: 03/11/2005 Document Revised: 07/26/2013 Document Reviewed: 04/21/2012 [facility] Patient Information 2015 [place], [place]. This information is not intended to replace advice given to you by your health care provider. Make sure you discuss any questions you have with your health care provider.  Information on my medicine - XARELTO  (Rivaroxaban)  This medication education was reviewed with me or my healthcare representative as part of my discharge preparation.   Why was Xarelto prescribed for you? Xarelto was prescribed for you to reduce the risk of a blood clot forming that can cause a stroke if you have a medical condition called atrial fibrillation (a type of irregular heartbeat).  What do you need to know about xarelto ? Take your Xarelto ONCE DAILY at the same time every day with your evening meal. If you have difficulty swallowing the tablet whole, you may crush it and mix in applesauce just prior to taking your dose.  Take Xarelto exactly as prescribed by your doctor and DO NOT stop taking Xarelto without talking to the doctor who prescribed the medication.  Stopping without other stroke prevention medication to take the place of Xarelto may increase your risk of developing a clot that causes a stroke.  Refill your prescription before you run out.  After discharge, you should have regular check-up appointments with your healthcare provider that is prescribing your Xarelto.  In the future your dose may need to be changed if your kidney function or weight changes by a significant amount.  What do you do if you miss a dose? If you are taking Xarelto ONCE DAILY and you miss a dose, take it as soon as you remember on the same day then continue your regularly scheduled once daily regimen the next day. Do not take two doses of Xarelto at the same time or on the same day.   Important Safety Information A possible side effect of Xarelto is bleeding. You should call your healthcare provider right away if you experience any of the following: ? Bleeding from an injury or your nose that does not stop. ? Unusual colored urine (red or dark brown) or unusual colored stools (red or black). ? Unusual bruising for unknown reasons. ? A serious fall or if you hit your head (even if there is no bleeding).  Some medicines  may interact with Xarelto and might increase your risk of bleeding while on Xarelto. To help avoid this, consult your healthcare provider or pharmacist prior to using any new prescription or non-prescription medications, including herbals, vitamins, non-steroidal anti-inflammatory drugs (NSAIDs) and supplements.  This website has more information on Xarelto: VisitDestination.com.br.

## 2014-10-31 NOTE — Progress Notes (Addendum)
Discussed with patient discharge instructions, he verbalized agreement and understanding.  Patient's IV was discontinued with no complications.  Patient to go out in wheelchair with all belongings to go to [employer] employee entrance to meet security to get car. Morene Crocker, RN-BSN 10/31/2014 [phone]

## 2014-10-31 NOTE — Progress Notes (Signed)
Subjective: The patient stated today that he feels much better, basically back to normal. He denied any difficulty breathing or chest pain. He notes that the swelling in his legs had gone down, though they were a little tight still. No pain noted.  Objective: Vital signs in last 24 hours: Filed Vitals:   10/30/14 1653 10/30/14 1803 10/30/14 2102 10/31/14 0510  BP: 124/90 137/70 126/69 117/78  Pulse: 91 96 90 101  Temp: 97.8 F (36.6 C) 97.3 F (36.3 C) 97.8 F (36.6 C) 98.7 F (37.1 C)  TempSrc: Oral Oral Oral Oral  Resp: 17 19 18 18   Height:      Weight:    99.247 kg (218 lb 12.8 oz)  SpO2: 96% 95% 95% 97%   Weight change: -3.674 kg (-8 lb 1.6 oz)  Intake/Output Summary (Last 24 hours) at 10/31/14 1126 Last data filed at 10/31/14 0819  Gross per 24 hour  Intake    430 ml  Output   1050 ml  Net   -620 ml   BP 117/78 mmHg  Pulse 101  Temp(Src) 98.7 F (37.1 C) (Oral)  Resp 18  Ht 5\' 4"  (1.626 m)  Wt 99.247 kg (218 lb 12.8 oz)  BMI 37.54 kg/m2  SpO2 97%  General Appearance:    Alert, cooperative, no distress, appears stated age  Head:    Normocephalic, without obvious abnormality, atraumatic  Back:     Symmetric, no curvature, ROM normal  Lungs:     Clear to auscultation bilaterally, respirations unlabored  Chest wall:    Tenderness L midclavicular line between 4th and 5th ribs,  no deformity  Heart:    Irregularly irregular rhythm, S1 and S2 normal, no murmur, rub   or gallop  Abdomen:     Soft, non-tender, bowel sounds active all four quadrants,    no masses,  Extremities:   Extremities normal, atraumatic, no cyanosis, Slight edema present in LE bilaterally  Pulses:   2+ and symmetric all extremities  Skin:   Skin color, texture, turgor normal, Scaly erythematous lesions on lower extremities bilaterally   Lab Results: Results for orders placed or performed during the hospital encounter of 10/29/14 (from the past 24 hour(s))  Glucose, capillary     Status:  Abnormal   Collection Time: 10/30/14  4:53 PM  Result Value Ref Range   Glucose-Capillary 116 (H) 65 - 99 mg/dL   Comment 1 Capillary Specimen   Glucose, capillary     Status: Abnormal   Collection Time: 10/30/14  9:28 PM  Result Value Ref Range   Glucose-Capillary 227 (H) 65 - 99 mg/dL  Basic metabolic panel     Status: Abnormal   Collection Time: 10/31/14  3:03 AM  Result Value Ref Range   Sodium 131 (L) 135 - 145 mmol/L   Potassium 4.7 3.5 - 5.1 mmol/L   Chloride 93 (L) 101 - 111 mmol/L   CO2 28 22 - 32 mmol/L   Glucose, Bld 169 (H) 65 - 99 mg/dL   BUN 27 (H) 6 - 20 mg/dL   Creatinine, Ser 1.61 (H) 0.61 - 1.24 mg/dL   Calcium 8.8 (L) 8.9 - 10.3 mg/dL   GFR calc non Af Amer >60 >60 mL/min   GFR calc Af Amer >60 >60 mL/min   Anion gap 10 5 - 15  Glucose, capillary     Status: Abnormal   Collection Time: 10/31/14  5:38 AM  Result Value Ref Range   Glucose-Capillary 194 (H) 65 -  99 mg/dL   Comment 1 Notify Financial planner Results: Recent Results (from the past 240 hour(s))  MRSA PCR Screening     Status: None   Collection Time: 10/29/14  5:42 PM  Result Value Ref Range Status   MRSA by PCR NEGATIVE NEGATIVE Final    Comment:        The GeneXpert MRSA Assay (FDA approved for NASAL specimens only), is one component of a comprehensive MRSA colonization surveillance program. It is not intended to diagnose MRSA infection nor to guide or monitor treatment for MRSA infections.    Studies/Results: Dg Chest Port 1 View  10/29/2014   CLINICAL DATA:  59 year old male with a history of previous systolic heart failure. Left-sided chest pain. Shortness of breath.  EXAM: PORTABLE CHEST - 1 VIEW  COMPARISON:  CT 10/11/2014, plain film 10/11/2014, 09/28/2014  FINDINGS: Re- demonstration of cardiomegaly, with similar configuration of the cardiomediastinal silhouette.  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 Unchanged cardiac pacing device on left chest wall with single lead in  place. The distal aspect of the lead is not imaged on the plain film.  Atherosclerosis of the aortic arch.  Diffuse interstitial opacities with interlobular septal thickening. Right basilar opacity with blunting of the right costophrenic angle.  No pneumothorax  IMPRESSION: Evidence of congestive heart failure with pulmonary edema and small right pleural effusion. The effusion appears to be persisted from the comparison study from October 11, 2014.  Surgical changes of median sternotomy and CABG.  Unchanged cardiac pacing device with single lead in place, as above.  Atherosclerosis.  Signed,  Yvone Neu. Loreta Ave, DO  [facility]  [facility]   Electronically Signed   By: Gilmer Mor D.O.   On: 10/29/2014 13:59   Medications: I have reviewed the patient's current medications. Scheduled Meds: . aspirin EC  81 mg Oral Daily  . atorvastatin  40 mg Oral q1800  . diltiazem  60 mg Oral 4 times per day  . docusate sodium  100 mg Oral Daily  . ferrous sulfate  325 mg Oral Q breakfast  . furosemide  80 mg Intravenous Q12H  . insulin aspart  0-15 Units Subcutaneous TID WC  . insulin glargine  10 Units Subcutaneous QHS  . rivaroxaban  20 mg Oral QAC supper  . sodium chloride  3 mL Intravenous Q12H  . spironolactone  25 mg Oral Daily  . traMADol  50 mg Oral 4 times per day   Continuous Infusions:  PRN Meds:.acetaminophen **OR** acetaminophen Assessment/Plan: Active Problems:   Atrial fibrillation with rapid ventricular response   Acute exacerbation of CHF (congestive heart failure)   Atrial fibrillation with RVR   Acute combined systolic and diastolic congestive heart failure   Chest pain   Homelessness   Type 2 diabetes mellitus with other circulatory complications   Coronary artery disease involving native coronary artery of native heart without angina pectoris  Acute on chronic CHF: Considering the recent lapse in medication and constellation of symptoms  that resemble previous hospitalizations, acute on chronic CHF exacerbation is our most likely diagnosis. Even though his Xarelto had also lapsed, it would be rare to see a bilateral DVT to cause the lower extremity edema. - Pro-BNP ~1200 on admission (last admission 1100) - Daily Weights, (226 pounds on admission, currently 218) - Strict I/Os (net negative 500 ml yesterday) - Lasix IV  once prior to discharge - Continue spironolactone  daily - Consider starting ACE  inhibitor in outpatient followup - Monitor kidney function (SCr 1.17 -> 1.27 today)  Atrial Fibrillation with RVR: - Diltiazem PO 60mg  q6h - Restart home metoprolol on d/c - Continue home Xarelto 20mg  daily  CAD:  - Continue home atorvastation 40mg  daily - Continue home ASA 81mg  daily  DM:  - A1C 8.4% in May 2016 - Hold home glipizide - SSI  FEN/GI: -Diet: Heart Healthy - Replace electrolytes as needed (K<4, Mg<2)  Prophylaxis: Xarelto  Code: Full  Dispo: Patient homeless upon last discharge, will be able to pick up medications at [facility] today, and he has an appointment with his PCP tomorrow  This is a Psychologist, occupational Note.  The care of the patient was discussed with Dr. Darreld Mclean and the assessment and plan formulated with their assistance.  Please see their attached note for official documentation of the daily encounter.   LOS: 2 days   Eyvonne Mechanic, Med Student 10/31/2014, 11:26 AM

## 2014-10-31 NOTE — Discharge Summary (Signed)
Name: Jacob [name] MRN: [id number] DOB: 1956/02/24 59 y.o. PCP: [name], MD  Date of Admission: 10/29/2014 12:57 PM Date of Discharge: 10/31/2014 Attending Physician: No att. providers found  Discharge Diagnosis: 1. Acute exacerbation of CHF  Active Problems:   Atrial fibrillation with rapid ventricular response   Acute exacerbation of CHF (congestive heart failure)   Acute combined systolic and diastolic congestive heart failure   Chest pain   Homelessness   Type 2 diabetes mellitus with other circulatory complications   Coronary artery disease involving native coronary artery of native heart without angina pectoris  Discharge Medications:   Medication List    TAKE these medications        albuterol (2.5 MG/3ML) 0.083% nebulizer solution  Commonly known as:  PROVENTIL  Take 3 mLs (2.5 mg total) by nebulization every 6 (six) hours as needed for wheezing or shortness of breath.     albuterol 108 (90 BASE) MCG/ACT inhaler  Commonly known as:  PROVENTIL HFA;VENTOLIN HFA  Inhale 2 puffs into the lungs every 6 (six) hours as needed for wheezing or shortness of breath.     aspirin 81 MG EC tablet  Take 1 tablet (81 mg total) by mouth daily.     atorvastatin 40 MG tablet  Commonly known as:  LIPITOR  Take 1 tablet (40 mg total) by mouth daily at 6 PM.     docusate sodium 100 MG capsule  Commonly known as:  COLACE  Take 1 capsule (100 mg total) by mouth daily.     ferrous sulfate 325 (65 FE) MG tablet  Take 1 tablet (325 mg total) by mouth 3 (three) times daily with meals.     furosemide 40 MG tablet  Commonly known as:  LASIX  3 tabs ( ) in the morning and 2 tabs ( ) in the evening     glipiZIDE 5 MG tablet  Commonly known as:  GLUCOTROL  Take 1 tablet (5 mg total) by mouth 2 (two) times daily before a meal.     Metoprolol Tartrate 75 MG Tabs  Take 75 mg by mouth 2 (two) times daily.     nicotine 7 mg/24hr patch  Commonly known as:  NICODERM CQ    Place 1 patch (7 mg total) onto the skin daily.     rivaroxaban 20 MG Tabs tablet  Commonly known as:  XARELTO  Take 1 tablet (20 mg total) by mouth daily before supper.     spironolactone 25 MG tablet  Commonly known as:  ALDACTONE  Take 1 tablet (25 mg total) by mouth daily.     traMADol 50 MG tablet  Commonly known as:  ULTRAM  Take 1 tablet (50 mg total) by mouth every 8 (eight) hours as needed for moderate pain.        Disposition and follow-up:   Mr.Emmette Maniscalco was discharged from [facility] in Good condition.  At the hospital follow up visit please address:  1.  Medication/Social: I have sent prescriptions to [facility] pharmacy for Mr. Pettway. He is to pick these up as soon as possible. Please follow up on his medication adherence. He has several boundaries to medical compliance including housing, transport, communication/phone issues. He is given the contact for Medicaid Transportation who are to help him get to his appointments.  CHF: Please follow up on his symptoms and medication compliance. His acute CHF exacerbation is more than likely due to not taking his medications for 4 days after  they ran out. Please also consider adding an ACE inhibitor to his medication regimen. We did not start in the hospital due to increased creatinine and occasional drops in his blood pressure.  2.  Labs / imaging needed at time of follow-up: Consider creatinine before ACE inhibitor initiation.  3.  Pending labs/ test needing follow-up: none  Follow-up Appointments: Follow-up Information    Follow up with [facility].   Why:  YOU HAVE A FOLLOW UP APPT WITH DR. AMAO ON TUESDAY AUGUST 16 AT 3:00 PM.  YOU will need to call the MEDICAID TRANSPORTATION TO  PICK YOU UP AT 2:00PM TO TAKE YOU TO THIS APPOINTMENT AND YOU NEED TO MAKE THIS ARRANGEMENT WITH MEDICAID TRANSPORTION.   Contact information:   [address] [place]  [address] [phone]      Follow up with MEDICAID TRANSPORTATION.   Why:  FOR ALL MEDICAL APPOINTMENTS, CALL THIS NUMBER TWO DAYS (48 HOURS) BEFORE YOUR APPOINTMENT.  YOUR TRANSPORTATION FOR YOUR FOLLOW UP VISIT ON TUESDAY AUGUST 16 HAS BEEN ARRANGED FOR YOU.   Contact information:   [phone]      Discharge Instructions: Discharge Instructions    (HEART FAILURE PATIENTS) Call MD:  Anytime you have any of the following symptoms: 1) 3 pound weight gain in 24 hours or 5 pounds in 1 week 2) shortness of breath, with or without a dry hacking cough 3) swelling in the hands, feet or stomach 4) if you have to sleep on extra pillows at night in order to breathe.    Complete by:  As directed      Call MD for:  severe uncontrolled pain    Complete by:  As directed      Diet - low sodium heart healthy    Complete by:  As directed      Increase activity slowly    Complete by:  As directed            Consultations:  none  Procedures Performed:  Dg Chest 2 View  10/11/2014   CLINICAL DATA:  Acute onset of left-sided chest pain and shortness of breath. Initial encounter.  EXAM: CHEST  2 VIEW  COMPARISON:  Chest radiograph performed 09/28/2014  FINDINGS: A small right pleural effusion is noted. Vascular congestion is seen, with mildly increased interstitial markings, possibly reflecting mild interstitial edema. No pneumothorax is identified.  The cardiomediastinal silhouette is enlarged. The patient is status post median sternotomy. An AICD is noted at the left chest wall, with a single lead ending at the right ventricle. No acute osseous abnormalities are seen.  IMPRESSION: Small right pleural effusion noted. Vascular congestion and cardiomegaly, with increased interstitial markings, likely reflecting mild interstitial edema.   Electronically Signed   By: Roanna Raider M.D.   On: 10/11/2014 05:54   Ct Angio Chest Pe W/cm &/or Wo Cm  10/11/2014   CLINICAL DATA:  Shortness of breath and chest  pain  EXAM: CT ANGIOGRAPHY CHEST WITH CONTRAST  TECHNIQUE: Multidetector CT imaging of the chest was performed using the standard protocol during bolus administration of intravenous contrast. Multiplanar CT image reconstructions and MIPs were obtained to evaluate the vascular anatomy.  CONTRAST:  OMNIPAQUE IOHEXOL 350 MG/ML SOLN  COMPARISON:  Chest radiograph October 11, 2014 ; CT abdomen May 17, 2012  FINDINGS: There is no demonstrable pulmonary embolus. There is atherosclerotic change in the aorta and visualized great vessels. There is no thoracic aortic aneurysm or dissection  apparent.  There are bilateral pleural effusions with bibasilar edema. There is patchy atelectasis in both lung bases as well.  Thyroid appears normal. There is no appreciable thoracic adenopathy. There are scattered small mediastinal lymph nodes which do not meet size criteria for pathologic significance.  There is extensive native coronary artery calcification. Patient is status post coronary artery bypass grafting. Pacemaker lead is attached to the right ventricle. The pericardium is not thickened. There is mild cardiomegaly. There is evidence of reflux into the hepatic veins and inferior vena cava suggesting increase in right heart pressure.  In the visualized upper abdomen, there is a persistent hyperplasia of the visualized left adrenal.  There is Degenerative change in the thoracic spine. There are no blastic or lytic bone lesions.  Review of the MIP images confirms the above findings.  IMPRESSION: No demonstrable pulmonary embolus.  Evidence of congestive heart failure.  Stable left adrenal hypertrophy. No adenopathy appreciable. Reflux of contrast into the hepatic veins and inferior vena cava suggests increase in right heart pressure. Multiple areas of atherosclerotic calcification.   Electronically Signed   By: Bretta Bang III M.D.   On: 10/11/2014 08:06   Dg Chest Port 1 View  10/29/2014   CLINICAL DATA:   59 year old male with a history of previous systolic heart failure. Left-sided chest pain. Shortness of breath.  EXAM: PORTABLE CHEST - 1 VIEW  COMPARISON:  CT 10/11/2014, plain film 10/11/2014, 09/28/2014  FINDINGS: Re- demonstration of cardiomegaly, with similar configuration of the cardiomediastinal silhouette.  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 Unchanged cardiac pacing device on left chest wall with single lead in place. The distal aspect of the lead is not imaged on the plain film.  Atherosclerosis of the aortic arch.  Diffuse interstitial opacities with interlobular septal thickening. Right basilar opacity with blunting of the right costophrenic angle.  No pneumothorax  IMPRESSION: Evidence of congestive heart failure with pulmonary edema and small right pleural effusion. The effusion appears to be persisted from the comparison study from October 11, 2014.  Surgical changes of median sternotomy and CABG.  Unchanged cardiac pacing device with single lead in place, as above.  Atherosclerosis.  Signed,  Yvone Neu. Loreta Ave, DO  [facility]  [facility]   Electronically Signed   By: Gilmer Mor D.O.   On: 10/29/2014 13:59    2D Echo:  08/03/2014:  Left ventricle: The cavity size was normal. Left ventriculargeometry showed evidence of eccentric hypertrophy. Systolicfunction was severely reduced. The estimated ejection fractionwas in the range of 20% to 25%. Severe diffuse hypokinesis withno identifiable regional variations. Doppler parameters areconsistent with elevated mean left atrial filling pressure.Acoustic contrast opacification revealed no evidence ofthrombus. - Left atrium: The atrium was severely dilated. - Right ventricle: The cavity size was mildly dilated. Systolic function was severely reduced. - Right atrium: The atrium was severely dilated. - Pericardium, extracardiac: There was a left pleural effusion.  Cardiac Cath:  12/02/2011 1. 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 Patient does have diffuse nonobstructive disease. 2. Moderate pulmonary hypertension with elevated left ventricular filling pressures.  Admission HPI: Mr. Jaqwan Wieber is a 59 year old male with PMH of A.fib with RVR on Xarelto, CAD s/p CABG x2, sCHF (echo 08/03/14 EF 20-25%) post ICD, type 2 DM, and PVD who presents with sudden onset of SOB for the last day. Dyspnea is constant, worse in supine position, associated with a dry cough. He also has complaint of  a sharp, non-radiating chest pain that was 4-5/10 in intensity that has now resolved. He states that he has gained about 9-10 pounds recently and has complaints of swelling in the legs. Patient reports that he has not taken his medications in the last four days . He reports going to pharmacy and being told to come the next day when medications would be ready to pick up, but he was unable due to transport issues. He was to see Dr. Venetia Night on 10/28/2014 but missed appointment. Patient has several barriers to medical care including transportation, housing, medicine adherence and phone/communication issues. He was admitted to hospital last month with similar symptoms after not taking his medications.  Hospital Course by problem list: Active Problems:   Atrial fibrillation with rapid ventricular response   Acute exacerbation of CHF (congestive heart failure)   Acute combined systolic and diastolic congestive heart failure   Chest pain   Homelessness   Type 2 diabetes mellitus with other circulatory complications   Coronary artery disease involving native coronary artery of native heart without angina pectoris   1. Acute exacerbation of CHF/Acute combined systolic and diastolic CHF: The patient presented on 10/29/2014 with sudden onset of shortness of breath and bilateral lower extremity edema, and admitted that it felt very similar to previous hospitalization for CHF  exacerbation. He had run out of his medication and has not taken for 4 days prior to hospital visit. He was started on IV Lasix 80mg  BID in order to return to euvolemic status. He was also restarted on home spironolactone. His symptoms improved quickly with diuresis. Diuresis continued until day of discharge when he was transitioned back to home dosage, 120mg  in the morning and 80 mg every evening. ACE-I not started during hospital stay due increased creatinine and occasional low BPs. He is scheduled for follow up outpatient visit soon, will defer addition of ACE-I to his provider.  2. Atrial fibrillation with RVR: The patient came in with a history of Afib with RVR controlled with metoprolol. He was noted to be tachycardic in the ED and was started on a diltiazem drip of 15mg  daily. He was weaned to 10mg  daily the following day, 10/30/2014, and then transitioned to oral 60mg  q6h that afternoon. Rates remained controlled under 110 bpm and blood pressures remained stable. He was discharged on home metoprolol. He had run out of his Xarelto four days prior to admission as well, so he was restarted on his home dose of 20mg  daily.  He has a CHADSVASc score of 4 with increased yearly risk of stroke at 4%.  3. Chest pain/tightness: The patient had noted some acute chest pain the morning before admission, though it had resolved by the time he came to the ED. Troponins at the time of admission were negative with no ischemic changes on EKG. Overnight on 10/29/2014 to 10/30/2014 the patient experienced abdominal pain and chest pain concurrently that resolved with Tylenol 650mg . Troponin was mildly elevated at 0.05. EKG again showed no ischemic changes. The patient was given tramadol to alleviate the pain. He was discharged without complaints of chest or abdominal pain.  4. CAD s/p CABG 2014: Patient has a history of CAD status post CABG in 2014. Due to chest pain, he was monitored on telemetry and troponins were followed,  though EKG showed no new changes. The patient was resumed on home aspirin and atorvastatin. Home metoprolol was held due to CHF exacerbation but was restarted upon discharge. Recommended having his follow up provider start  ACE-inhibitor therapy.  5. Type 2 Diabetes Mellitus: Home Glipizide was held and he was started on SSI-moderate.   6. Homelessness: Patient has barriers to medical compliance including homelessness, transportation issues, communication/phone issues, as well as medical adherence. He does not want to go to homeless shelters as he states those are catering to alcohol and drug abuse. He has been given the contact information for Medicaid Transportation and advised to call them for travel assistance to his appointments. He understands and agrees to do this. He is scheduled for follow up visit with Dr. Venetia Night next week. His medications have been sent to [facility].  Discharge Vitals:   BP 120/76 mmHg  Pulse 98  Temp(Src) 97 F (36.1 C) (Oral)  Resp 17  Ht 5\' 4"  (1.626 m)  Wt 218 lb 12.8 oz (99.247 kg)  BMI 37.54 kg/m2  SpO2 98%  Discharge Labs:  Results for orders placed or performed during the hospital encounter of 10/29/14 (from the past 24 hour(s))  Glucose, capillary     Status: Abnormal   Collection Time: 10/30/14  4:53 PM  Result Value Ref Range   Glucose-Capillary 116 (H) 65 - 99 mg/dL   Comment 1 Capillary Specimen   Glucose, capillary     Status: Abnormal   Collection Time: 10/30/14  9:28 PM  Result Value Ref Range   Glucose-Capillary 227 (H) 65 - 99 mg/dL  Basic metabolic panel     Status: Abnormal   Collection Time: 10/31/14  3:03 AM  Result Value Ref Range   Sodium 131 (L) 135 - 145 mmol/L   Potassium 4.7 3.5 - 5.1 mmol/L   Chloride 93 (L) 101 - 111 mmol/L   CO2 28 22 - 32 mmol/L   Glucose, Bld 169 (H) 65 - 99 mg/dL   BUN 27 (H) 6 - 20 mg/dL   Creatinine, Ser 8.11 (H) 0.61 - 1.24 mg/dL   Calcium 8.8 (L) 8.9 - 10.3 mg/dL   GFR calc non Af Amer >60 >60 mL/min    GFR calc Af Amer >60 >60 mL/min   Anion gap 10 5 - 15  Glucose, capillary     Status: Abnormal   Collection Time: 10/31/14  5:38 AM  Result Value Ref Range   Glucose-Capillary 194 (H) 65 - 99 mg/dL   Comment 1 Notify RN   Glucose, capillary     Status: Abnormal   Collection Time: 10/31/14 11:57 AM  Result Value Ref Range   Glucose-Capillary 196 (H) 65 - 99 mg/dL   Comment 1 Notify RN     Signed: Darreld Mclean, MD 10/31/2014, 2:57 PM    Services Ordered on Discharge: none Equipment Ordered on Discharge: none

## 2014-10-31 NOTE — Telephone Encounter (Signed)
The patient has been followed by the [facility]  ( [facility]) at [facility].  This CM called the patient after his discharge to confirm that he has his medications and to schedule a follow up appointment with TCC. He said that he was discharged this afternoon and he has picked up all of his medications at the [facility] pharmacy.  He currently has an appointment scheduled at [facility]  For next week,  11/08/14 @ 1500 but is agreeable to scheduling an appointment for follow up this week due to his recent hospitalization.  An appointment was scheduled for 11/02/14 @ 1430 w/ Dr Venetia Night and he said that he will be there and does not need any assistance w/ transportation.

## 2014-11-01 ENCOUNTER — Telehealth: Payer: Self-pay

## 2014-11-01 NOTE — Telephone Encounter (Signed)
[facility] Post-discharge Follow-Up Phone Call:  Date of Discharge: 10/31/2014 Principal Discharge Diagnosis(es): CHF exacerabation, Afib, RVR Post-discharge Communication: Called the patient Call Completed: Yes                   With Whom: Patient Interpreter Needed: No                     Please check all that apply:  X Patient is knowledgeable of his/her condition(s) and/or treatment. X Patient is caring for self.  ? Patient is receiving assist at home from family and/or caregiver. Family and/or caregiver is knowledgeable of patient's condition(s) and/or treatment. ? Patient is receiving home health services. If so, name of agency.     Medication Reconciliation:  ? Medication list reviewed with patient. X Patient obtained all discharge medications. If not, why?  The patient said that he has all of his medications and is taking them as ordered. This CM confirmed w/ [facility] that the patient picked up all of his medications yesterday.   This CM instructed him to bring all of his medications w/ him to his appointment tomorrow and he said that he would and he would review them w/ the MD at that time. He also said that he has a nebulizer to use when needed.  He has a glucometer and said that he checks his blood sugars BID and reported that his blood sugar this morning was 165.    Activities of Daily Living:  X Independent ? Needs assist (describe; ? home DME used) ? [facility] (describe, ? home DME used)   Community resources in place for patient:  X None  - He said that he continues to live in his car and is not interested in going to a shelter.  He noted that he has no problems assessing food, stating that he eats at restaurants or with friends.  ? Home Health/Home DME ? Assisted Living ? Support Group          Patient Education:  The patient has been followed at the TCC recently and is agreeable to continuing follow up in the TCC clinic. He is aware of the  services that are provided at the [facility].  He reported that he is feeling "good" and that his feet are " still swollen; but better than before."  He said that he is taking lasix 120 mg in the morning and 80 mg at night, which is noted on the hospital MD discharge summary.    Questions/Concerns discussed: No problems/questions reported. He confirmed that he will be at his appointment tomorrow, 11/02/14 @ 1430 and stated that he has transportation to the clinic.

## 2014-11-02 ENCOUNTER — Telehealth: Payer: Self-pay

## 2014-11-02 ENCOUNTER — Encounter: Payer: Self-pay | Admitting: Family Medicine

## 2014-11-02 ENCOUNTER — Ambulatory Visit: Payer: Medicaid Other | Attending: Family Medicine | Admitting: Family Medicine

## 2014-11-02 VITALS — BP 125/84 | HR 80 | Temp 98.3°F | Ht 64.0 in | Wt 216.6 lb

## 2014-11-02 DIAGNOSIS — Z95810 Presence of automatic (implantable) cardiac defibrillator: Secondary | ICD-10-CM | POA: Insufficient documentation

## 2014-11-02 DIAGNOSIS — F17210 Nicotine dependence, cigarettes, uncomplicated: Secondary | ICD-10-CM | POA: Diagnosis not present

## 2014-11-02 DIAGNOSIS — R42 Dizziness and giddiness: Secondary | ICD-10-CM | POA: Diagnosis not present

## 2014-11-02 DIAGNOSIS — I4891 Unspecified atrial fibrillation: Secondary | ICD-10-CM | POA: Diagnosis not present

## 2014-11-02 DIAGNOSIS — I2510 Atherosclerotic heart disease of native coronary artery without angina pectoris: Secondary | ICD-10-CM | POA: Diagnosis not present

## 2014-11-02 DIAGNOSIS — Z955 Presence of coronary angioplasty implant and graft: Secondary | ICD-10-CM | POA: Diagnosis not present

## 2014-11-02 DIAGNOSIS — I509 Heart failure, unspecified: Secondary | ICD-10-CM | POA: Insufficient documentation

## 2014-11-02 DIAGNOSIS — I252 Old myocardial infarction: Secondary | ICD-10-CM | POA: Insufficient documentation

## 2014-11-02 DIAGNOSIS — Z79899 Other long term (current) drug therapy: Secondary | ICD-10-CM | POA: Insufficient documentation

## 2014-11-02 DIAGNOSIS — I10 Essential (primary) hypertension: Secondary | ICD-10-CM | POA: Diagnosis not present

## 2014-11-02 DIAGNOSIS — Z7982 Long term (current) use of aspirin: Secondary | ICD-10-CM | POA: Diagnosis not present

## 2014-11-02 DIAGNOSIS — E780 Pure hypercholesterolemia: Secondary | ICD-10-CM | POA: Insufficient documentation

## 2014-11-02 DIAGNOSIS — I482 Chronic atrial fibrillation, unspecified: Secondary | ICD-10-CM

## 2014-11-02 DIAGNOSIS — Z951 Presence of aortocoronary bypass graft: Secondary | ICD-10-CM | POA: Insufficient documentation

## 2014-11-02 DIAGNOSIS — Z7901 Long term (current) use of anticoagulants: Secondary | ICD-10-CM | POA: Diagnosis not present

## 2014-11-02 DIAGNOSIS — E1165 Type 2 diabetes mellitus with hyperglycemia: Secondary | ICD-10-CM | POA: Diagnosis not present

## 2014-11-02 DIAGNOSIS — Z91199 Patient's noncompliance with other medical treatment and regimen due to unspecified reason: Secondary | ICD-10-CM

## 2014-11-02 DIAGNOSIS — Z590 Homelessness: Secondary | ICD-10-CM | POA: Diagnosis not present

## 2014-11-02 DIAGNOSIS — I5042 Chronic combined systolic (congestive) and diastolic (congestive) heart failure: Secondary | ICD-10-CM | POA: Diagnosis not present

## 2014-11-02 DIAGNOSIS — Z9119 Patient's noncompliance with other medical treatment and regimen: Secondary | ICD-10-CM | POA: Diagnosis not present

## 2014-11-02 LAB — GLUCOSE, POCT (MANUAL RESULT ENTRY): POC Glucose: 132 mg/dl — AB (ref 70–99)

## 2014-11-02 MED ORDER — GLIPIZIDE 10 MG PO TABS: 10.0000 mg | ORAL_TABLET | Freq: Two times a day (BID) | ORAL | Status: DC

## 2014-11-02 MED ORDER — LISINOPRIL 2.5 MG PO TABS: 2.5000 mg | ORAL_TABLET | Freq: Every day | ORAL | Status: DC

## 2014-11-02 NOTE — Progress Notes (Signed)
TRANSITIONAL CARE CLINIC  Date of telephone encounter: 10/31/14  Admit date: 10/29/14 Discharge date: 10/31/14  PCP: Dr Hyman Hopes   Subjective:    Patient ID: Jacob [name], male    DOB: [birth year], 59 y.o.   MRN: [id number]  HPI  59 year old homeless male with a history of CAD status post CABG 2, severe ischemic cardiomyopathy with EF of 20-25% status post ICD, hypertension, hyperlipidemia, type 2 diabetes mellitus, atrial fibrillation on anticoagulation with Xarelto, PVD who presented to [facility] ED after he had missed his appointment at the clinic was found to be severely dyspneic.and had admitted to running out of his medications.  He was started on IV Lasix and his home medications were resumed but ACEI was not resumed due to slightly elevated creatinine. Rate control was achieved with IV diltiazem patient was then transitioned to oral form. Troponins were mildly elevated at 0.03, 0.05, N natriuretic peptide was 1232.4. EKG was negative for ischemic changes. He did not have additional cardiac workup as he had a 2-D echo in 07/2014 which revealedLeft ventricle: The cavity size was normal. Left ventriculargeometry showed evidence of eccentric hypertrophy. Systolicfunction was severely reduced. The estimated ejection fractionwas in the range of 20% to 25%. Severe diffuse hypokinesis withno identifiable regional variations. Doppler parameters areconsistent with elevated mean left atrial filling pressure.Acoustic contrast opacification revealed no evidence ofthrombus. Left atrium: The atrium was severely dilated. Right ventricle: The cavity size was mildly dilated. Systolicfunction was severely reduced. Right atrium: The atrium was severely dilated. Pericardium, extracardiac: There was a left pleural effusion.  His condition improved and he was subsequently discharged.   Interval history: He admits to compliance with medications and complains of feeling a little bit dizzy but admits that he  has not eaten all day as he only has a meal once a day. He also complains of pedal edema; he sleeps in his car and his feet are always dangling down and during the day he sits in a coffee shop as he has no home. States he has friends who buy the things he needs for him.   Barriers to care: Patient is homeless and resides in his car and has refused going to a shelter. He has a regular eating habits and this affects his glycemic control.  Past Medical History  Diagnosis Date  . Hypertension   . Systolic heart failure     EF is 40-45% by echo, December 2013  . Noncompliance   . CAD (coronary artery disease) Sept 2013    s/p cardiac cath showing occlusion of small RCA with collaterals  . Chronic anticoagulation     on coumadin  . Atrial fibrillation   . Peripheral arterial disease   . Automatic implantable cardioverter-defibrillator in situ   . High cholesterol   . Myocardial infarction 2014  . Type II diabetes mellitus   . CHF (congestive heart failure)   . Shortness of breath dyspnea     Past Surgical History  Procedure Laterality Date  . Implantable cardioverter defibrillator implant      [place] in 07/2012; [employer]  . Coronary artery bypass graft  09/2012    2 vessels per patient [name]   . Coronary angioplasty with stent placement  11/2011    "1"  . Cardiac catheterization  09/2012  . Iliac artery stent Right 08/30/2013  . Left and right heart catheterization with coronary angiogram N/A 12/02/2011    Procedure: LEFT AND RIGHT HEART CATHETERIZATION WITH CORONARY ANGIOGRAM;  Surgeon: Cristal Deer  Ellene Route, MD;  Location: [facility] CATH LAB;  Service: Cardiovascular;  Laterality: N/A;  . Lower extremity angiogram N/A 08/30/2013    Procedure: LOWER EXTREMITY ANGIOGRAM;  Surgeon: Runell Gess, MD;  Location: [facility] CATH LAB;  Service: Cardiovascular;  Laterality: N/A;  . Lower extremity angiogram N/A 12/02/2013    Procedure: LOWER EXTREMITY ANGIOGRAM;  Surgeon: Runell Gess, MD;   [facility] C[facility];  Service: Cardiovascular;  Laterality: N/A;    No Known Allergies  Social History   Social History  . Marital Status: Divorced    Spouse Name: N/A  . Number of Children: 3  . Years of Education: N/A   Occupational History  . Unemployed    Social History Main Topics  . Smoking status: Current Every Day Smoker -- 0.50 packs/day for 38 years    Types: Cigarettes  . Smokeless tobacco: Never Used  . Alcohol Use: No  . Drug Use: No  . Sexual Activity: Yes   Other Topics Concern  . Not on file   Social History Narrative   Has an apartment with a roommate. He was living on the streets in 02/09/13.  He reports that his father died in [place] in Feb 09, 2013.  He is divorced.  He is no longer estranged from his son, but still from his daughter who lives locally.  Neither of his parents, nor any siblings have any history of CAD.    Current Outpatient Prescriptions on File Prior to Visit  Medication Sig Dispense Refill  . albuterol (PROVENTIL HFA;VENTOLIN HFA) 108 (90 BASE) MCG/ACT inhaler Inhale 2 puffs into the lungs every 6 (six) hours as needed for wheezing or shortness of breath. 1 Inhaler 0  . albuterol (PROVENTIL) (2.5 MG/3ML) 0.083% nebulizer solution Take 3 mLs (2.5 mg total) by nebulization every 6 (six) hours as needed for wheezing or shortness of breath. 150 mL 1  . aspirin EC 81 MG EC tablet Take 1 tablet (81 mg total) by mouth daily. 30 tablet 5  . atorvastatin (LIPITOR) 40 MG tablet Take 1 tablet (40 mg total) by mouth daily at 6 PM. 30 tablet 5  . docusate sodium (COLACE) 100 MG capsule Take 1 capsule (100 mg total) by mouth daily. 30 capsule 5  . ferrous sulfate 325 (65 FE) MG tablet Take 1 tablet (325 mg total) by mouth 3 (three) times daily with meals. 90 tablet 5  . furosemide (LASIX) 40 MG tablet 3 tabs (120mg ) in the morning and 2 tabs (80mg ) in the evening 150 tablet 1  . metoprolol 75 MG TABS Take 75 mg by mouth 2 (two) times daily. 60  tablet 2  . rivaroxaban (XARELTO) 20 MG TABS tablet Take 1 tablet (20 mg total) by mouth daily before supper. 30 tablet 6  . spironolactone (ALDACTONE) 25 MG tablet Take 1 tablet (25 mg total) by mouth daily. 30 tablet 6  . traMADol (ULTRAM) 50 MG tablet Take 1 tablet (50 mg total) by mouth every 8 (eight) hours as needed for moderate pain. 30 tablet 0   No current facility-administered medications on file prior to visit.     Review of Systems  Constitutional: Negative for activity change and appetite change.  HENT: Negative for sinus pressure and sore throat.   Eyes: Negative for visual disturbance.  Respiratory: Positive for shortness of breath. Negative for cough and chest tightness.   Cardiovascular: Positive for leg swelling. Negative for chest pain and palpitations.  Gastrointestinal: Negative for abdominal pain and abdominal  distention.  Endocrine: Negative for cold intolerance, heat intolerance and polyphagia.  Genitourinary: Negative for dysuria, frequency and difficulty urinating.  Musculoskeletal: Negative for back pain, joint swelling and arthralgias.  Skin: Negative for color change.  Neurological: Positive for light-headedness. Negative for dizziness, tremors and weakness.  Psychiatric/Behavioral: Negative for suicidal ideas and behavioral problems.         Objective: Filed Vitals:   11/02/14 1442  BP: 125/84  Pulse: 80  Temp: 98.3 F (36.8 C)  Height:  (1.626 m)  Weight: 216 lb 9.6 oz (98.249 kg)  SpO2: 97%      Physical Exam  Constitutional: He is oriented to person, place, and time. He appears well-developed and well-nourished.  Neck: Normal range of motion. Neck supple. No JVD present. No tracheal deviation present.  Cardiovascular: Normal rate and normal heart sounds.  An irregularly irregular rhythm present.  No murmur heard. Pulmonary/Chest: Effort normal and breath sounds normal. No respiratory distress. He has no wheezes. He exhibits no  tenderness.  Abdominal: Soft. Bowel sounds are normal. He exhibits no mass. There is no tenderness.  Musculoskeletal: Normal range of motion. He exhibits no edema (1+bilateral pitting pedal edema) or tenderness.  Neurological: He is alert and oriented to person, place, and time.  Psychiatric: He has a normal mood and affect.         Assessment & Plan:  59 year old homeless male with a history of CAD status post CABG 2, severe ischemic cardiomyopathy with EF of 20-25% status post ICD, hypertension, hyperlipidemia, type 2 diabetes mellitus, atrial fibrillation on anticoagulation with Xarelto, PVD.  Acute on chronic CHF: EF of 20-25% Continue Lasix 120 mg in the morning and 80 mg at bedtime. He has a scale and is to weigh daily, limit fluids to 2 L per day, low-sodium diet.  Hypertension: Controlled on metopolol 75 mg twice daily .  Type 2 diabetes mellitus: Uncontrolled with A1c of 8.4, CBG is normal Needs Hba1c at next visit. Glipizide increased from 5 mg to 10 mg but I called the patient after his visit informed him to maintain his current dose of 5 mg due to his complains of dizziness and after his A1c at his next visit the new dose will be determined. Low dose ace added for renal protection; will monitor creatinine.  Dizziness: Dizziness could also be secondary to skipping meals as he has admitted to taking only one meal a day; we have advised him of resources in the community  To  obtain food  Atrial fibrillation: Continue anticoagulation with Xarelto, rate control with metoprolol. Advised to bring in all his medications at his next visit so we can verify the correct dosing.  Coronary artery disease: Status post stent.  Tobacco abuse: Not ready to quit. Spent 3 minutes counselling  This note has been created with Education officer, environmental. Any transcriptional errors are unintentional.

## 2014-11-02 NOTE — Telephone Encounter (Signed)
Met with the patient when he was in the [place] clinic today and discussed his options for getting his meals and the importance of eating more than 1 meal /day to maintain his blood sugar.  He said that he does not have food for more than 1 meal /day. CM offered options of food pantries, [facility]) and he refused all options.  He said that he will continue to get his meals from friends or from restaurants.  He is not interested in any help at this time.  He said that he also understands that it is important that he eat more than 1 meal a day to manage his blood sugar and prevent a drop in his blood sugar and he will do the best he can with what he has and with what friends offer him.   Also discussed his current living situation. He said that he continues to live in his car. He showers at friends' houses and he gets gas from friends who own gas stations. He noted again that he is not interested in living in a shelter and does not want to go to the [facility].  He said that he once was involved with the [facility] and lived at the [facility] and he liked that. CM offered to have him speak to the [facility]  SW and he was agreeable to scheduling an appointment with her to discuss his housing options again.   An appointment was scheduled w/ Vesta Mixer, SW for 11/07/14 @ 1430.

## 2014-11-02 NOTE — Patient Instructions (Signed)

## 2014-11-02 NOTE — Progress Notes (Signed)
Patient here for follow up on his CHF and DM2 Patient was observed sleeping in the waiting room and was difficult to arouse Upon entering the clinic the patient appears very lethargic and states that he is very tired and when asked about his SOB he says "it is not great it is not terribly" Upon entering the room patient appeared to be dizzy and he confirmed that he was Patient states he always feels this way and when asked reports he has not eaten today and has no food He states he is taking all medications and is still using tobacco products

## 2014-11-07 ENCOUNTER — Other Ambulatory Visit: Payer: Medicaid Other

## 2014-11-08 ENCOUNTER — Encounter: Payer: Self-pay | Admitting: Pharmacist

## 2014-11-08 ENCOUNTER — Inpatient Hospital Stay: Payer: Medicaid Other | Admitting: Family Medicine

## 2014-11-08 ENCOUNTER — Telehealth: Payer: Self-pay

## 2014-11-08 NOTE — Telephone Encounter (Signed)
Called the patient to check on his status.  He said that he is "doing good" and he has been taking his medications.  He said that he has some questions about his medications and would like to meet with someone to review them.  Discussed his status w/ Lance Bosch, RN who said that she would meet with him tomorrow, 11/09/14 @ 1630.  He said that he would be at the appointment and he agreed to bring all of his medications to the appointment.  This CM informed him that he will only be meeting w/ the nurse and will not be seeing a doctor and he verbalized understanding. He also said that he would like to speak to the social worker,  This CM spoke to [facility] , SW and explained that the patient will be in the clinic tomorrow and would like to speak to her. She said that he missed an appointment that he had with her yesterday but she would speak to him tomorrow after he meets with the nurse.   This CM informed him that the SW will speak to him tomorrow after he meets with the nurse and he was very appreciative.  He noted that he was not aware that he had an appointment with the SW yesterday.   He stated that he is aware that his next appointment with Dr Venetia Night is scheduled for 11/17/14.

## 2014-11-09 ENCOUNTER — Encounter (HOSPITAL_BASED_OUTPATIENT_CLINIC_OR_DEPARTMENT_OTHER): Payer: Medicaid Other | Admitting: Clinical

## 2014-11-09 DIAGNOSIS — Z5900 Homelessness unspecified: Secondary | ICD-10-CM

## 2014-11-09 NOTE — Progress Notes (Signed)
ASSESSMENT: Pt currently experiencing homelessness and would benefit from community resources. Stage of Change: precontemplative  PLAN: 1. F/U with behavioral health consultant in as needed 2. Psychiatric Medications: none. 3. Behavioral recommendation(s):   -Put medications in cooler provided by [facility], to keep out of heat -Consider [facility] for showers and laundry, as needed -Answer call about housing from [place]: Pt. referred by [facility] for community resources:  Pt. reports the following symptoms/concerns: Pt states that he met a woman today at 1pm at [facility] from [facility] about finding him housing, but he does not remember what she said; he is concerned his medications will go bad in the heat, and he says he wants to be able to take showers. He was formerly in the [employer].   Duration of problem: About a year Severity:mild  OBJECTIVE: Orientation & Cognition: Oriented x3. Thought processes normal and appropriate to situation. Mood: appropriate. Affect: appropriate Appearance: appropriate considering heat and homelessness Risk of harm to self or others: no risk of harm to self or others Substance use: none Assessments administered: PHQ9: 3/ GAD7: 0  Diagnosis: Homelessness CPT Code: Z59.0 -------------------------------------------- Other(s) present in the room: none  Time spent with patient in exam room: 45 minutes

## 2014-11-11 ENCOUNTER — Telehealth: Payer: Self-pay | Admitting: Clinical

## 2014-11-11 ENCOUNTER — Inpatient Hospital Stay (HOSPITAL_COMMUNITY)
Admission: EM | Admit: 2014-11-11 | Discharge: 2014-11-14 | DRG: 308 | Disposition: A | Discharge status: Home / Self Care | Payer: Medicaid Other | Attending: Student in an Organized Health Care Education/Training Program | Admitting: Student in an Organized Health Care Education/Training Program

## 2014-11-11 ENCOUNTER — Encounter (HOSPITAL_COMMUNITY): Payer: Self-pay | Admitting: Nurse Practitioner

## 2014-11-11 ENCOUNTER — Emergency Department (HOSPITAL_COMMUNITY): Payer: Medicaid Other

## 2014-11-11 DIAGNOSIS — R0682 Tachypnea, not elsewhere classified: Secondary | ICD-10-CM

## 2014-11-11 DIAGNOSIS — E8770 Fluid overload, unspecified: Secondary | ICD-10-CM | POA: Diagnosis not present

## 2014-11-11 DIAGNOSIS — R600 Localized edema: Secondary | ICD-10-CM

## 2014-11-11 DIAGNOSIS — I2510 Atherosclerotic heart disease of native coronary artery without angina pectoris: Secondary | ICD-10-CM | POA: Diagnosis present

## 2014-11-11 DIAGNOSIS — E785 Hyperlipidemia, unspecified: Secondary | ICD-10-CM | POA: Diagnosis present

## 2014-11-11 DIAGNOSIS — I252 Old myocardial infarction: Secondary | ICD-10-CM | POA: Diagnosis not present

## 2014-11-11 DIAGNOSIS — I481 Persistent atrial fibrillation: Secondary | ICD-10-CM | POA: Diagnosis present

## 2014-11-11 DIAGNOSIS — E1151 Type 2 diabetes mellitus with diabetic peripheral angiopathy without gangrene: Secondary | ICD-10-CM | POA: Diagnosis present

## 2014-11-11 DIAGNOSIS — I25119 Atherosclerotic heart disease of native coronary artery with unspecified angina pectoris: Secondary | ICD-10-CM

## 2014-11-11 DIAGNOSIS — R0600 Dyspnea, unspecified: Secondary | ICD-10-CM | POA: Diagnosis present

## 2014-11-11 DIAGNOSIS — Z950 Presence of cardiac pacemaker: Secondary | ICD-10-CM

## 2014-11-11 DIAGNOSIS — Z7901 Long term (current) use of anticoagulants: Secondary | ICD-10-CM | POA: Diagnosis not present

## 2014-11-11 DIAGNOSIS — E119 Type 2 diabetes mellitus without complications: Secondary | ICD-10-CM

## 2014-11-11 DIAGNOSIS — E780 Pure hypercholesterolemia: Secondary | ICD-10-CM | POA: Diagnosis present

## 2014-11-11 DIAGNOSIS — I272 Other secondary pulmonary hypertension: Secondary | ICD-10-CM | POA: Diagnosis present

## 2014-11-11 DIAGNOSIS — I482 Chronic atrial fibrillation: Secondary | ICD-10-CM | POA: Diagnosis not present

## 2014-11-11 DIAGNOSIS — I5023 Acute on chronic systolic (congestive) heart failure: Secondary | ICD-10-CM | POA: Insufficient documentation

## 2014-11-11 DIAGNOSIS — I2583 Coronary atherosclerosis due to lipid rich plaque: Secondary | ICD-10-CM

## 2014-11-11 DIAGNOSIS — R0602 Shortness of breath: Secondary | ICD-10-CM

## 2014-11-11 DIAGNOSIS — Z590 Homelessness: Secondary | ICD-10-CM

## 2014-11-11 DIAGNOSIS — I472 Ventricular tachycardia: Secondary | ICD-10-CM | POA: Diagnosis present

## 2014-11-11 DIAGNOSIS — Z7982 Long term (current) use of aspirin: Secondary | ICD-10-CM

## 2014-11-11 DIAGNOSIS — Z9119 Patient's noncompliance with other medical treatment and regimen: Secondary | ICD-10-CM | POA: Diagnosis present

## 2014-11-11 DIAGNOSIS — I10 Essential (primary) hypertension: Secondary | ICD-10-CM | POA: Diagnosis present

## 2014-11-11 DIAGNOSIS — I4891 Unspecified atrial fibrillation: Secondary | ICD-10-CM | POA: Diagnosis present

## 2014-11-11 DIAGNOSIS — Z951 Presence of aortocoronary bypass graft: Secondary | ICD-10-CM

## 2014-11-11 DIAGNOSIS — R0789 Other chest pain: Secondary | ICD-10-CM | POA: Insufficient documentation

## 2014-11-11 LAB — I-STAT TROPONIN, ED: Troponin i, poc: 0.03 ng/mL (ref 0.00–0.08)

## 2014-11-11 LAB — HEPATIC FUNCTION PANEL
ALBUMIN: 3.3 g/dL — AB (ref 3.5–5.0)
ALK PHOS: 116 U/L (ref 38–126)
ALT: 12 U/L — AB (ref 17–63)
AST: 18 U/L (ref 15–41)
BILIRUBIN TOTAL: 0.9 mg/dL (ref 0.3–1.2)
Bilirubin, Direct: 0.4 mg/dL (ref 0.1–0.5)
Indirect Bilirubin: 0.5 mg/dL (ref 0.3–0.9)
Total Protein: 7.1 g/dL (ref 6.5–8.1)

## 2014-11-11 LAB — CBC
HEMATOCRIT: 36.7 % — AB (ref 39.0–52.0)
HEMOGLOBIN: 10.4 g/dL — AB (ref 13.0–17.0)
MCH: 19.5 pg — AB (ref 26.0–34.0)
MCHC: 28.3 g/dL — AB (ref 30.0–36.0)
MCV: 68.7 fL — ABNORMAL LOW (ref 78.0–100.0)
Platelets: 247 10*3/uL (ref 150–400)
RBC: 5.34 MIL/uL (ref 4.22–5.81)
RDW: 21.6 % — AB (ref 11.5–15.5)
WBC: 9.3 10*3/uL (ref 4.0–10.5)

## 2014-11-11 LAB — BASIC METABOLIC PANEL
Anion gap: 9 (ref 5–15)
BUN: 22 mg/dL — AB (ref 6–20)
CALCIUM: 8.7 mg/dL — AB (ref 8.9–10.3)
CO2: 25 mmol/L (ref 22–32)
Chloride: 100 mmol/L — ABNORMAL LOW (ref 101–111)
Creatinine, Ser: 1.15 mg/dL (ref 0.61–1.24)
GFR calc Af Amer: >60 mL/min (ref >60)
GFR calc non Af Amer: >60 mL/min (ref >60)
GLUCOSE: 233 mg/dL — AB (ref 65–99)
Potassium: 4.2 mmol/L (ref 3.5–5.1)
Sodium: 134 mmol/L — ABNORMAL LOW (ref 135–145)

## 2014-11-11 LAB — I-STAT CHEM 8, ED
BUN: 28 mg/dL — AB (ref 6–20)
CALCIUM ION: 1.08 mmol/L — AB (ref 1.12–1.23)
CHLORIDE: 99 mmol/L — AB (ref 101–111)
Creatinine, Ser: 1.1 mg/dL (ref 0.61–1.24)
GLUCOSE: 244 mg/dL — AB (ref 65–99)
HCT: 39 % (ref 39.0–52.0)
Hemoglobin: 13.3 g/dL (ref 13.0–17.0)
Potassium: 4.2 mmol/L (ref 3.5–5.1)
Sodium: 135 mmol/L (ref 135–145)
TCO2: 23 mmol/L (ref 0–100)

## 2014-11-11 LAB — LIPASE, BLOOD: Lipase: 27 U/L (ref 22–51)

## 2014-11-11 LAB — GLUCOSE, CAPILLARY: GLUCOSE-CAPILLARY: 177 mg/dL — AB (ref 65–99)

## 2014-11-11 LAB — BRAIN NATRIURETIC PEPTIDE: B Natriuretic Peptide: 807.3 pg/mL — ABNORMAL HIGH (ref 0.0–100.0)

## 2014-11-11 MED ORDER — ALBUTEROL SULFATE (2.5 MG/3ML) 0.083% IN NEBU
2.5000 mg | INHALATION_SOLUTION | Freq: Four times a day (QID) | RESPIRATORY_TRACT | Status: DC | PRN
  Administered 2014-11-12 – 2014-11-13 (×4): 2.5 mg via RESPIRATORY_TRACT
  Filled 2014-11-11 (×4): qty 3

## 2014-11-11 MED ORDER — MORPHINE SULFATE (PF) 2 MG/ML IV SOLN: 2.0000 mg | INTRAVENOUS | Status: DC | PRN

## 2014-11-11 MED ORDER — ASPIRIN EC 81 MG PO TBEC
81.0000 mg | DELAYED_RELEASE_TABLET | Freq: Every day | ORAL | Status: DC
  Administered 2014-11-12 – 2014-11-14 (×3): 81 mg via ORAL
  Filled 2014-11-11 (×3): qty 1

## 2014-11-11 MED ORDER — SPIRONOLACTONE 25 MG PO TABS
25.0000 mg | ORAL_TABLET | Freq: Every day | ORAL | Status: DC
  Administered 2014-11-12 – 2014-11-14 (×3): 25 mg via ORAL
  Filled 2014-11-11 (×3): qty 1

## 2014-11-11 MED ORDER — SODIUM CHLORIDE 0.9 % IJ SOLN
3.0000 mL | Freq: Two times a day (BID) | INTRAMUSCULAR | Status: DC
  Administered 2014-11-12 – 2014-11-14 (×3): 3 mL via INTRAVENOUS

## 2014-11-11 MED ORDER — DILTIAZEM LOAD VIA INFUSION
20.0000 mg | Freq: Once | INTRAVENOUS | Status: AC
  Administered 2014-11-11: 20 mg via INTRAVENOUS
  Filled 2014-11-11: qty 20

## 2014-11-11 MED ORDER — DILTIAZEM HCL 60 MG PO TABS
60.0000 mg | ORAL_TABLET | Freq: Four times a day (QID) | ORAL | Status: DC
Start: 2014-11-12 — End: 2014-11-14
  Administered 2014-11-11 – 2014-11-14 (×10): 60 mg via ORAL
  Filled 2014-11-11 (×11): qty 1

## 2014-11-11 MED ORDER — INSULIN ASPART 100 UNIT/ML ~~LOC~~ SOLN
0.0000 [IU] | Freq: Every day | SUBCUTANEOUS | Status: DC
  Administered 2014-11-13: 2 [IU] via SUBCUTANEOUS

## 2014-11-11 MED ORDER — ASPIRIN EC 325 MG PO TBEC
325.0000 mg | DELAYED_RELEASE_TABLET | Freq: Once | ORAL | Status: AC
  Administered 2014-11-11: 325 mg via ORAL
  Filled 2014-11-11: qty 1

## 2014-11-11 MED ORDER — RIVAROXABAN 20 MG PO TABS
20.0000 mg | ORAL_TABLET | Freq: Every day | ORAL | Status: DC
  Administered 2014-11-11 – 2014-11-13 (×3): 20 mg via ORAL
  Filled 2014-11-11 (×3): qty 1

## 2014-11-11 MED ORDER — INSULIN ASPART 100 UNIT/ML ~~LOC~~ SOLN
0.0000 [IU] | Freq: Three times a day (TID) | SUBCUTANEOUS | Status: DC
  Administered 2014-11-12: 5 [IU] via SUBCUTANEOUS
  Administered 2014-11-12 (×2): 2 [IU] via SUBCUTANEOUS
  Administered 2014-11-13: 5 [IU] via SUBCUTANEOUS
  Administered 2014-11-13 (×2): 2 [IU] via SUBCUTANEOUS
  Administered 2014-11-14 (×2): 3 [IU] via SUBCUTANEOUS

## 2014-11-11 MED ORDER — DILTIAZEM HCL 100 MG IV SOLR
5.0000 mg/h | INTRAVENOUS | Status: DC
  Administered 2014-11-11: 5 mg/h via INTRAVENOUS
  Filled 2014-11-11: qty 100

## 2014-11-11 MED ORDER — FUROSEMIDE 10 MG/ML IJ SOLN
40.0000 mg | Freq: Once | INTRAMUSCULAR | Status: AC
  Administered 2014-11-11: 40 mg via INTRAVENOUS
  Filled 2014-11-11: qty 4

## 2014-11-11 MED ORDER — SODIUM CHLORIDE 0.9 % IV BOLUS (SEPSIS): 1000.0000 mL | Freq: Once | INTRAVENOUS | Status: DC

## 2014-11-11 MED ORDER — FUROSEMIDE 10 MG/ML IJ SOLN
80.0000 mg | Freq: Once | INTRAMUSCULAR | Status: AC
  Administered 2014-11-11: 80 mg via INTRAVENOUS
  Filled 2014-11-11: qty 8

## 2014-11-11 MED ORDER — MORPHINE SULFATE (PF) 4 MG/ML IV SOLN
4.0000 mg | Freq: Once | INTRAVENOUS | Status: AC
  Administered 2014-11-11: 4 mg via INTRAVENOUS
  Filled 2014-11-11: qty 1

## 2014-11-11 MED ORDER — ATORVASTATIN CALCIUM 40 MG PO TABS
40.0000 mg | ORAL_TABLET | Freq: Every day | ORAL | Status: DC
  Administered 2014-11-12 – 2014-11-13 (×2): 40 mg via ORAL
  Filled 2014-11-11 (×2): qty 1

## 2014-11-11 NOTE — ED Notes (Signed)
Report attempted 

## 2014-11-11 NOTE — H&P (Signed)
Date: 11/12/2014               Patient Name:  Jacob [name] MRN: [id number]  DOB: 1955/10/11 Age / Sex: 59 y.o., male   PCP: [name], MD         Medical Service: Internal Medicine Teaching Service         Attending Physician: Dr. Tyson Alias, MD    First Contact: Dr. Allena Katz Pager: [phone]  Second Contact: Dr. Mikey Bussing Pager: [phone]       After Hours (After 5p/  First Contact Pager: [phone]  weekends / holidays): Second Contact Pager: [phone]   Chief Complaint: SOB, fatigue, weakness, chest pain  History of Present Illness: Mr. Jacob [name] is a 59 y.o. male w/ PMHx of HTN, chronic sCHF w/ AICD (EF 20-25%), CAD, DM type II, chronic atrial fibrillation on Xarelto, HLD, homelessness, and h/o non-compliance, presents to the ED w/ complaints of SOB, fatigue, and chest pain. Patient states he has been having worsening SOB for the past day or so, w/ accompanying chest pain. States these symptoms woke him up from his sleep this AM. He has had this feeling many times in the past. He claims the pain is left-sided, sharp in nature, non-radiating, and reproducible in nature. He has known h/o severe sCHF, states he has worsening SOB w/ exertion as well as worsening symptoms of PND and orthopnea. States he has to sleep in a recliner/chair when he sleeps and has done this for about 1 year. He denies palpitations, but does admit to some intermittent dizziness.   In ED, patient was noted to be in atrial fibrillation w/ RVR, rate in the 140-150's. Started on Diltiazem gtt and given lasix 40 mg IV once.    Meds: Current Facility-Administered Medications  Medication Dose Route Frequency Provider Last Rate Last Dose  . albuterol (PROVENTIL) (2.5 MG/3ML) 0.083% nebulizer solution 2.5 mg  2.5 mg Nebulization Q6H PRN Courtney Paris, MD      . aspirin EC tablet 81 mg  81 mg Oral Daily Courtney Paris, MD      . atorvastatin (LIPITOR) tablet 40 mg  40 mg Oral q1800 Courtney Paris, MD      .  diltiazem (CARDIZEM) tablet 60 mg  60 mg Oral 4 times per day Courtney Paris, MD   60 mg at 11/11/14 2340  . insulin aspart (novoLOG) injection 0-5 Units  0-5 Units Subcutaneous QHS Courtney Paris, MD   0 Units at 11/11/14 2300  . insulin aspart (novoLOG) injection 0-9 Units  0-9 Units Subcutaneous TID WC Courtney Paris, MD      . morphine 2 MG/ML injection 2 mg  2 mg Intravenous Q4H PRN Courtney Paris, MD      . rivaroxaban Carlena Hurl) tablet 20 mg  20 mg Oral QAC supper Courtney Paris, MD   20 mg at 11/11/14 2340  . sodium chloride 0.9 % injection 3 mL  3 mL Intravenous Q12H Courtney Paris, MD   3 mL at 11/11/14 2305  . spironolactone (ALDACTONE) tablet 25 mg  25 mg Oral Daily Courtney Paris, MD        Allergies: Allergies as of 11/11/2014 - Review Complete 11/11/2014  Allergen Reaction Noted  . Tape Itching 11/11/2014   Past Medical History  Diagnosis Date  . Hypertension   . Systolic heart failure     EF is 40-45% by echo, December 2013  . Noncompliance   .  CAD (coronary artery disease) Sept 2013    s/p cardiac cath showing occlusion of small RCA with collaterals  . Chronic anticoagulation     on coumadin  . Atrial fibrillation   . Peripheral arterial disease   . Automatic implantable cardioverter-defibrillator in situ   . High cholesterol   . Myocardial infarction 2014  . Type II diabetes mellitus   . CHF (congestive heart failure)   . Shortness of breath dyspnea    Past Surgical History  Procedure Laterality Date  . Implantable cardioverter defibrillator implant      [place] in 07/2012; [employer]  . Coronary artery bypass graft  09/2012    2 vessels per patient [name]   . Coronary angioplasty with stent placement  11/2011    "1"  . Cardiac catheterization  09/2012  . Iliac artery stent Right 08/30/2013  . Left and right heart catheterization with coronary angiogram N/A 12/02/2011    Procedure: LEFT AND RIGHT HEART CATHETERIZATION WITH CORONARY ANGIOGRAM;  Surgeon: Kathleene Hazel, MD;  Location: [facility] CATH LAB;  Service: Cardiovascular;  Laterality: N/A;  . Lower extremity angiogram N/A 08/30/2013    Procedure: LOWER EXTREMITY ANGIOGRAM;  Surgeon: Runell Gess, MD;  Location: [facility] CATH LAB;  Service: Cardiovascular;  Laterality: N/A;  . Lower extremity angiogram N/A 12/02/2013    Procedure: LOWER EXTREMITY ANGIOGRAM;  Surgeon: Runell Gess, MD;  Location: [facility] CATH LAB;  Service: Cardiovascular;  Laterality: N/A;   Family History  Problem Relation Age of Onset  . Diabetes Mother    Social History   Social History  . Marital Status: Divorced    Spouse Name: N/A  . Number of Children: 3  . Years of Education: N/A   Occupational History  . Unemployed    Social History Main Topics  . Smoking status: Current Every Day Smoker -- 0.50 packs/day for 38 years    Types: Cigarettes  . Smokeless tobacco: Never Used  . Alcohol Use: No  . Drug Use: No  . Sexual Activity: Yes   Other Topics Concern  . Not on file   Social History Narrative   Has an apartment with a roommate. He was living on the streets in 02/02/2013.  He reports that his father died in [place] in 2013/02/02.  He is divorced.  He is no longer estranged from his son, but still from his daughter who lives locally.  Neither of his parents, nor any siblings have any history of CAD.    Review of Systems:  General: Positive for fatigue. Denies fever, chills, diaphoresis, appetite change.  Respiratory: Positive for SOB, DOE, mild dry cough, PND and orthopnea. Denies chest tightness, and wheezing.   Cardiovascular: Positive for chest pain. Denies palpitations.  Gastrointestinal: Denies nausea, vomiting, abdominal pain, diarrhea, constipation, blood in stool and abdominal distention.  Musculoskeletal: Denies myalgias, back pain, joint swelling, arthralgias and gait problem.  Skin: Denies pallor, rash and wounds.  Neurological: Positive for intermittent dizziness and mild generalized weakness.  Denies seizures, syncope, lightheadedness, numbness and headaches.  Psychiatric/Behavioral: Denies mood changes, confusion, nervousness, sleep disturbance and agitation.   Physical Exam: Blood pressure 147/89, pulse 106, temperature 98.2 F (36.8 C), temperature source Oral, resp. rate 20, height 5\' 4"  (1.626 m), weight 222 lb 4.8 oz (100.835 kg), SpO2 95 %.  General: Obese male, alert, cooperative, NAD. HEENT: PERRL, EOMI. Moist mucus membranes Neck: Full range of motion, supple, no lymphadenopathy or carotid bruits. Mild JVD.  Lungs: Air entry  equal bilaterally, bibasilar rales. No rhonchi or wheezes.  Heart: Rate irregular, mild tachycardia, no murmurs, gallops, or rubs. Abdomen: Soft, non-tender, non-distended, BS + Extremities: No cyanosis, clubbing. Venous stasis changes in LE's bilaterally, +2 pitting edema.  Neurologic: Alert & oriented x3, cranial nerves II-XII intact, strength grossly intact, sensation intact to light touch  Lab results: Basic Metabolic Panel:  Recent Labs  65/78/46 1825 11/11/14 1856  NA 134* 135  K 4.2 4.2  CL 100* 99*  CO2 25  --   GLUCOSE 233* 244*  BUN 22* 28*  CREATININE 1.15 1.10  CALCIUM 8.7*  --    CBC:  Recent Labs  11/11/14 1825 11/11/14 1856  WBC 9.3  --   HGB 10.4* 13.3  HCT 36.7* 39.0  MCV 68.7*  --   PLT 247  --     Imaging results:  Dg Chest Portable 1 View  11/11/2014   CLINICAL DATA:  Shortness of breath. Congestive heart failure. Coronary artery disease.  EXAM: PORTABLE CHEST - 1 VIEW  COMPARISON:  10/29/2014  FINDINGS: Mild to moderate cardiomegaly stable. Diffuse interstitial infiltrates show no significant change, consistent with interstitial edema. Small right pleural effusion is again demonstrated. Left costophrenic sulcus is not included within the field of view on this exam.  Prior CABG noted. Single lead transvenous pacemaker remains in place.  IMPRESSION: Mild congestive heart failure and small right pleural  effusion, without significant change.   Electronically Signed   By: Myles Rosenthal M.D.   On: 11/11/2014 19:19    Other results: EKG: Atrial fibrillation w/ HR of 110. Prolonged QTc 505.   ECHO 08/03/14: - Left ventricle: The cavity size was normal. Left ventricular geometry showed evidence of eccentric hypertrophy. Systolic function was severely reduced. The estimated ejection fraction was in the range of 20% to 25%. Severe diffuse hypokinesis with no identifiable regional variations. Doppler parameters are consistent with elevated mean left atrial filling pressure.Acoustic contrast opacification revealed no evidence of thrombus. - Left atrium: The atrium was severely dilated. - Right ventricle: The cavity size was mildly dilated. Systolic function was severely reduced. - Right atrium: The atrium was severely dilated. - Pericardium, extracardiac: There was a left pleural effusion.   Assessment & Plan by Problem: Mr. Wise Fees is a 59 y.o. male w/ PMHx of HTN, chronic sCHF w/ AICD (EF 20-25%), CAD, DM type II, chronic atrial fibrillation on Xarelto, HLD, homelessness, and h/o non-compliance, admitted w/ atrial fibrillation w/ RVR and suspected acute on chronic systolic CHF.  Atrial Fibrillation w/ RVR: Rate in the 140-150's on admission. Patient w/ known h/o chronic persistent atrial fibrillation, on Xarelto. Take Metoprolol 75 mg bid for rate control at home. Said he did not take his dose this AM. Previous TSH from 07/2014 2.3. This patients CHA2DS2-VASc Score and unadjusted Ischemic Stroke Rate (% per year) is equal to 4.8 % stroke rate/year from a score of 4. Unclear if RVR is the reason for his symptoms or if this resulted from simply not taking his AM dose of Metoprolol. Patient has known h/o non-compliance w/ medications and previous admissions for RVR. Rate low 100's when seen at bedside. No palpitations or chest discomfort.  -Admit to telemetry -Discontinue Cardizem gtt; on 10-12.5 mg/hr. Will  convert to Cardizem 60 mg q6h po -Can resume home Metoprolol on discharge or when rate improved.  -Continue Xarelto 20 mg daily; will give dose this PM -Repeat EKG in AM  Acute on Chronic sCHF: Suspect only mild exacerbation, SOB  and fatigue most likely related to RVR on admission. Patient w/ severe systolic dysfunction, previous ECHO from 07/2014 shows EF of 20-25% w/ severe diffuse hypokinesis. AICD in place. Weight 216 lbs at clinic visit on 11/02/14 (suspect this is his dry weight).  CXR significant for mild pulmonary venous congestion and right-sided pleural effusion, appears generally unchanged from previous. BNP 807, previous was 1232. Lungs w/ bibasilar crackles on exam, mild JVD present. Patient does endorse NYHA class III symptoms, PND, and orthopnea. Given Lasix 40 mg IV in ED. Takes Lasix 120 mg in AM, 80 mg in PM. Says he is compliant w/ this, although did not take this AM.  -Repeat CXR 2v in AM -Will give another dose of Lasix 80 mg IV once; can consider restarting home regimen vs continuing 80 mg IV bid if appropriately diuresing.  -Continue Spironolactone 25 mg daily; restart in AM given soft BP in ED -Cardizem as above -Hold Lisinopril for now given low BP ; can likely resume in AM  DM type II: Most recent HbA1c 8.4 on 08/03/14. Takes Glipizide 10 mg bid at home. ACEI 2.5 mg daily added during previous clinic visit for renal protection.  -ISS -Repeat HbA1c per clinic notes -Restart ACEI as above as BP tolerates  CAD: S/p CABG in 2014. Does admit to some chest pain on admission, states he woke up with this. Claims it is sharp in nature, tender to palpation. Seems quite atypical, although, given his significant history, cardiac workup reasonable. EKG w/ no new significant ischemic findings, troponin x1 negative.  -Continue to cycle troponins -Repeat EKG in AM as above -Continue ASA, statin -Morphine 2 mg q4h prn for pain -CCB/BB as above -UDS pending (previously  negative)  Homelessness: Has refused shelter in the past as he states the shelters harbor significant alcohol and drug abuse. Follows w/ [facility] and does in fact follow up here and obtains his medications.  -Can consider SW consult if thought to be necessary  Diet: Carb modified  DVT/PE PPx: Xarelto  Code: Full  Dispo: Disposition is deferred at this time, awaiting improvement of current medical problems. Anticipated discharge in approximately 1-2 day(s).   The patient does have a current PCP ([name], MD) and does not need an [facility] hospital follow-up appointment after discharge.  The patient does not have transportation limitations that hinder transportation to clinic appointments.  Signed: [name], DO 11/12/2014, 1:00 AM

## 2014-11-11 NOTE — ED Notes (Signed)
Pt had multiple runs of v-tach 4-6beats at a time. MD [facility] notified. Pt places on de-fib pads.

## 2014-11-11 NOTE — ED Provider Notes (Signed)
History   Chief Complaint  Patient presents with  . Chest Pain    HPI 59 year old male with past medical history as below notable for A. fib on Xarelto, CAD status post CABG 2, CHF Echo 08/03/14 with EF 20-25% status post ICD, diabetes, PVD, homelessness who presents to ED for evaluation of chest pain and shortness of breath. Patient reports he feels like he is retaining fluid. Patient states he's been taking his medications as instructed until today when he stated he did not take them because he felt bad. Patient reports also having left-sided sharp chest pain that does not radiate which he says he has had multiple times in the past. States he has his pain a couple times a month for the past couple years. Denies having the pain currently. Patient also denies any diaphoresis, nausea, vomiting. He states his legs have become more swollen than usual. Also notes he was recently admitted for heart failure from 8/6-8/8. No fevers, chills, cough or other illness reported. No other complaints at this time.  Past medical/surgical history, social history, medications, allergies and FH have been reviewed with patient and/or in documentation. Furthermore, if pt family or friend(s) present, additional historical information was obtained from them.  Past Medical History  Diagnosis Date  . Hypertension   . Systolic heart failure     EF is 40-45% by echo, December 2013  . Noncompliance   . CAD (coronary artery disease) Sept 2013    s/p cardiac cath showing occlusion of small RCA with collaterals  . Chronic anticoagulation     on coumadin  . Atrial fibrillation   . Peripheral arterial disease   . Automatic implantable cardioverter-defibrillator in situ   . High cholesterol   . Myocardial infarction 2014  . Type II diabetes mellitus   . CHF (congestive heart failure)   . Shortness of breath dyspnea    Past Surgical History  Procedure Laterality Date  . Implantable cardioverter defibrillator implant       [place] in 07/2012; [employer]  . Coronary artery bypass graft  09/2012    2 vessels per patient [name]   . Coronary angioplasty with stent placement  11/2011    "1"  . Cardiac catheterization  09/2012  . Iliac artery stent Right 08/30/2013  . Left and right heart catheterization with coronary angiogram N/A 12/02/2011    Procedure: LEFT AND RIGHT HEART CATHETERIZATION WITH CORONARY ANGIOGRAM;  Surgeon: Kathleene Hazel, MD;  Location: [facility] CATH LAB;  Service: Cardiovascular;  Laterality: N/A;  . Lower extremity angiogram N/A 08/30/2013    Procedure: LOWER EXTREMITY ANGIOGRAM;  Surgeon: Runell Gess, MD;  Location: [facility] CATH LAB;  Service: Cardiovascular;  Laterality: N/A;  . Lower extremity angiogram N/A 12/02/2013    Procedure: LOWER EXTREMITY ANGIOGRAM;  Surgeon: Runell Gess, MD;  Location: [facility] CATH LAB;  Service: Cardiovascular;  Laterality: N/A;   Family History  Problem Relation Age of Onset  . Diabetes Mother    Social History  Substance Use Topics  . Smoking status: Current Every Day Smoker -- 0.50 packs/day for 38 years    Types: Cigarettes  . Smokeless tobacco: Never Used  . Alcohol Use: No     Review of Systems Constitutional: - F/C, +fatigue.  HENT: - congestion, -rhinorrhea, -sore throat.   Eyes: - eye pain, -visual disturbance.  Respiratory: - cough, +SOB, -hemoptysis.   Cardiovascular: + CP, -palps.  Gastrointestinal: - N/V/D, -abd pain  Genitourinary: - flank pain, -dysuria, -frequency.  Musculoskeletal: -  myalgia/arthritis, -joint swelling, -gait abnormality, -back pain, -neck pain/stiffness, +leg pain swelling.  Skin: - rash/lesion.  Neurological: - focal weakness, -lightheadedness, -dizziness, -numbness, -HA.  All other systems reviewed and are negative.   Physical Exam  Physical Exam  ED Triage Vitals  Enc Vitals Group     BP 11/11/14 1806 113/95 mmHg     Pulse Rate 11/11/14 1806 141     Resp 11/11/14 1806 18     Temp 11/11/14 1806  97.7 F (36.5 C)     Temp Source 11/11/14 1806 Oral     SpO2 11/11/14 1806 98 %     Weight --      Height --      Head Cir --      Peak Flow --      Pain Score 11/11/14 1807 7     Pain Loc --      Pain Edu? --      Excl. in GC? --    Constitutional: well appearing 59 yo male who is in NAD. Head: Normocephalic and atraumatic.  Eyes: Extraocular motion intact, no scleral icterus Mouth: MMM, OP clear Neck: Supple without meningismus, mass, or overt JVD Respiratory: No respiratory distress. Normal WOB. No w/r/g. CV: tachy, irreg rythym, no obvious murmurs.  Pulses +2 and symmetric. Volume overloaded Abdomen: Soft, NT, ND, no r/g. No mass.  MSK: Extremities are atraumatic without deformity, ROM intact. 2+ pitting edema. Skin: Warm, dry, intact without rash Neuro: AAOx4, MAE 5/5 sym, no focal deficit noted   ED Course  Procedures   Labs Reviewed  BASIC METABOLIC PANEL - Abnormal; Notable for the following:    Sodium 134 (*)    Chloride 100 (*)    Glucose, Bld 233 (*)    BUN 22 (*)    Calcium 8.7 (*)    All other components within normal limits  CBC - Abnormal; Notable for the following:    Hemoglobin 10.4 (*)    HCT 36.7 (*)    MCV 68.7 (*)    MCH 19.5 (*)    MCHC 28.3 (*)    RDW 21.6 (*)    All other components within normal limits  BRAIN NATRIURETIC PEPTIDE - Abnormal; Notable for the following:    B Natriuretic Peptide 807.3 (*)    All other components within normal limits  HEPATIC FUNCTION PANEL - Abnormal; Notable for the following:    Albumin 3.3 (*)    ALT 12 (*)    All other components within normal limits  GLUCOSE, CAPILLARY - Abnormal; Notable for the following:    Glucose-Capillary 177 (*)    All other components within normal limits  I-STAT CHEM 8, ED - Abnormal; Notable for the following:    Chloride 99 (*)    BUN 28 (*)    Glucose, Bld 244 (*)    Calcium, Ion 1.08 (*)    All other components within normal limits  LIPASE, BLOOD  TROPONIN I   TROPONIN I  HEMOGLOBIN A1C  BASIC METABOLIC PANEL  CBC  URINE RAPID DRUG SCREEN, HOSP PERFORMED  I-STAT TROPOININ, ED   I personally reviewed and interpreted all labs.  Dg Chest Portable 1 View  11/11/2014   CLINICAL DATA:  Shortness of breath. Congestive heart failure. Coronary artery disease.  EXAM: PORTABLE CHEST - 1 VIEW  COMPARISON:  10/29/2014  FINDINGS: Mild to moderate cardiomegaly stable. Diffuse interstitial infiltrates show no significant change, consistent with interstitial edema. Small right pleural effusion is again demonstrated. Left  costophrenic sulcus is not included within the field of view on this exam.  Prior CABG noted. Single lead transvenous pacemaker remains in place.  IMPRESSION: Mild congestive heart failure and small right pleural effusion, without significant change.   Electronically Signed   By: Myles Rosenthal M.D.   On: 11/11/2014 19:19   I personally viewed above image(s) which were used in my medical decision making. Formal interpretations by Radiology.   EKG Interpretation  Date/Time:    Ventricular Rate:    PR Interval:    QRS Duration:   QT Interval:    QTC Calculation:   R Axis:     Text Interpretation:         MDM: Kamdyn Colborn is a 59 y.o. male with H&P as above who p/w CC: Chest pain, shortness of breath, volume overloaded  On arrival, patient is hemodynamically stable and is noted to be in A. fib RVR. No hypotension. Patient mildly short of breath with this. Patient appears volume overloaded. Patient not having any chest pain currently.  EKG shows A. fib RVR at a rate of 137, left axis deviation, nonspecific T-wave abnormalities in 1 and aVL. EKG otherwise unchanged from prior EKG on 8/6 and is without signs of acute ischemia.   Diltiazem bolus and drip were started and heart rate is now rate controlled.  Chest x-ray shows mild edema and small right-sided effusion.  -Results: BNP elevated. Lasix given. -Re-evaluation: 9:22 PM Pt sleeping  comfortably in bed. Rate 100 on dilt gtt at 12.5. BP stable. Will admit for further mgmt of afib.  Old records reviewed (if available). Labs and imaging reviewed personally by myself and considered in medical decision making if ordered.  Clinical Impression: 1. Shortness of breath     Disposition: Admit  Condition: stable  I have discussed the results, Dx and Tx plan with the pt(& family if present). He/she/they expressed understanding and agree(s) with the plan.  Pt seen in conjunction with Dr. Garrel Ridgel, DO [facility] Emergency Medicine Resident - PGY-3     Ames Dura, MD 11/12/14 1610  Arby Barrette, MD 11/17/14 2041

## 2014-11-11 NOTE — ED Notes (Addendum)
He c/o CP and SOB constant throughout the day today, onset this am. Symptoms increased with exertion, decreased by nothing. A&Ox4, breathing mildly labored at triage

## 2014-11-11 NOTE — ED Notes (Signed)
Pacemaker has been interrogated.  ?

## 2014-11-11 NOTE — Telephone Encounter (Signed)
Returned pt call; We have not heard back from [facility] to find out who met w pt this past week at the [facility] regarding a housing application. Pt states that the cooler to store his medications, given to him by [facility], is "working good".

## 2014-11-12 ENCOUNTER — Encounter (HOSPITAL_COMMUNITY): Payer: Self-pay | Admitting: Emergency Medicine

## 2014-11-12 ENCOUNTER — Inpatient Hospital Stay (HOSPITAL_COMMUNITY): Payer: Medicaid Other

## 2014-11-12 DIAGNOSIS — R0789 Other chest pain: Secondary | ICD-10-CM | POA: Insufficient documentation

## 2014-11-12 DIAGNOSIS — I5023 Acute on chronic systolic (congestive) heart failure: Secondary | ICD-10-CM | POA: Insufficient documentation

## 2014-11-12 DIAGNOSIS — R0602 Shortness of breath: Secondary | ICD-10-CM | POA: Insufficient documentation

## 2014-11-12 LAB — RAPID URINE DRUG SCREEN, HOSP PERFORMED
AMPHETAMINES: NOT DETECTED
BARBITURATES: NOT DETECTED
BENZODIAZEPINES: NOT DETECTED
COCAINE: NOT DETECTED
Opiates: POSITIVE — AB
TETRAHYDROCANNABINOL: NOT DETECTED

## 2014-11-12 LAB — TROPONIN I
TROPONIN I: 0.04 ng/mL — AB (ref <0.031)
TROPONIN I: 0.04 ng/mL — AB (ref <0.031)

## 2014-11-12 LAB — CBC
HCT: 37.8 % — ABNORMAL LOW (ref 39.0–52.0)
Hemoglobin: 10.6 g/dL — ABNORMAL LOW (ref 13.0–17.0)
MCH: 19.3 pg — ABNORMAL LOW (ref 26.0–34.0)
MCHC: 28 g/dL — ABNORMAL LOW (ref 30.0–36.0)
MCV: 69 fL — ABNORMAL LOW (ref 78.0–100.0)
PLATELETS: 250 10*3/uL (ref 150–400)
RBC: 5.48 MIL/uL (ref 4.22–5.81)
RDW: 21.8 % — AB (ref 11.5–15.5)
WBC: 9 10*3/uL (ref 4.0–10.5)

## 2014-11-12 LAB — BASIC METABOLIC PANEL
Anion gap: 9 (ref 5–15)
BUN: 23 mg/dL — AB (ref 6–20)
CALCIUM: 8.5 mg/dL — AB (ref 8.9–10.3)
CO2: 25 mmol/L (ref 22–32)
CREATININE: 1.22 mg/dL (ref 0.61–1.24)
Chloride: 97 mmol/L — ABNORMAL LOW (ref 101–111)
Glucose, Bld: 208 mg/dL — ABNORMAL HIGH (ref 65–99)
Potassium: 3.9 mmol/L (ref 3.5–5.1)
SODIUM: 131 mmol/L — AB (ref 135–145)

## 2014-11-12 LAB — GLUCOSE, CAPILLARY
GLUCOSE-CAPILLARY: 190 mg/dL — AB (ref 65–99)
Glucose-Capillary: 179 mg/dL — ABNORMAL HIGH (ref 65–99)
Glucose-Capillary: 287 mg/dL — ABNORMAL HIGH (ref 65–99)
Glucose-Capillary: 170 mg/dL — ABNORMAL HIGH (ref 65–99)

## 2014-11-12 MED ORDER — FUROSEMIDE 10 MG/ML IJ SOLN
80.0000 mg | Freq: Two times a day (BID) | INTRAMUSCULAR | Status: DC
  Administered 2014-11-12 (×2): 80 mg via INTRAVENOUS
  Filled 2014-11-12 (×2): qty 8

## 2014-11-12 NOTE — Plan of Care (Signed)
Problem: Phase I Progression Outcomes Goal: Ventricular heart rate < 120/min Outcome: Progressing Pt transitioned to cardizem PO Goal: Heart rate or rhythm control medication Outcome: Completed/Met Date Met:  11/12/14 PO cardizem

## 2014-11-12 NOTE — Progress Notes (Signed)
Subjective: Currently chest pain free, still SOB when laying flat. Objective: Vital signs in last 24 hours: Filed Vitals:   11/11/14 2229 11/11/14 2329 11/12/14 0619 11/12/14 0641  BP: 102/84 147/89 137/94   Pulse: 108 106 102   Temp: 97.7 F (36.5 C) 98.2 F (36.8 C) 97.5 F (36.4 C)   TempSrc:  Oral    Resp: 20 20 20    Height:  5\' 4"  (1.626 m)    Weight:  222 lb 4.8 oz (100.835 kg)  220 lb 12.8 oz (100.154 kg)  SpO2: 93% 95% 93%    Weight change:   Intake/Output Summary (Last 24 hours) at 11/12/14 0944 Last data filed at 11/11/14 2319  Gross per 24 hour  Intake    240 ml  Output    575 ml  Net   -335 ml   General: resting in bed HEENT: PERRL, EOMI, no scleral icterus, JVD to jaw line Cardiac: irr irr, no rubs, murmurs or gallops Pulm: RLL crackles, moving normal volumes of air Abd: soft, mildly tender sub sternal, nondistended, BS present Ext: warm and well perfused, 1+ pedal edema Neuro: alert and oriented X3  Lab Results: Basic Metabolic Panel:  Recent Labs Lab 11/11/14 1825 11/11/14 1856  NA 134* 135  K 4.2 4.2  CL 100* 99*  CO2 25  --   GLUCOSE 233* 244*  BUN 22* 28*  CREATININE 1.15 1.10  CALCIUM 8.7*  --    Liver Function Tests:  Recent Labs Lab 11/11/14 1825  AST 18  ALT 12*  ALKPHOS 116  BILITOT 0.9  PROT 7.1  ALBUMIN 3.3*    Recent Labs Lab 11/11/14 1825  LIPASE 27   No results for input(s): AMMONIA in the last 168 hours. CBC:  Recent Labs Lab 11/11/14 1825 11/11/14 1856 11/12/14 0850  WBC 9.3  --  9.0  HGB 10.4* 13.3 10.6*  HCT 36.7* 39.0 37.8*  MCV 68.7*  --  69.0*  PLT 247  --  250   Cardiac Enzymes:  Recent Labs Lab 11/12/14 0105  TROPONINI 0.04*   BNP: No results for input(s): PROBNP in the last 168 hours. D-Dimer: No results for input(s): DDIMER in the last 168 hours. CBG:  Recent Labs Lab 11/11/14 2312 11/12/14 0635  GLUCAP 177* 179*   Urine Drug Screen: Drugs of Abuse     Component Value  Date/Time   LABOPIA POSITIVE* 11/12/2014 0135   COCAINSCRNUR NONE DETECTED 11/12/2014 0135   LABBENZ NONE DETECTED 11/12/2014 0135   AMPHETMU NONE DETECTED 11/12/2014 0135   THCU NONE DETECTED 11/12/2014 0135   LABBARB NONE DETECTED 11/12/2014 0135     Micro Results: No results found for this or any previous visit (from the past 240 hour(s)). Studies/Results: Dg Chest Portable 1 View  11/11/2014   CLINICAL DATA:  Shortness of breath. Congestive heart failure. Coronary artery disease.  EXAM: PORTABLE CHEST - 1 VIEW  COMPARISON:  10/29/2014  FINDINGS: Mild to moderate cardiomegaly stable. Diffuse interstitial infiltrates show no significant change, consistent with interstitial edema. Small right pleural effusion is again demonstrated. Left costophrenic sulcus is not included within the field of view on this exam.  Prior CABG noted. Single lead transvenous pacemaker remains in place.  IMPRESSION: Mild congestive heart failure and small right pleural effusion, without significant change.   Electronically Signed   By: Myles Rosenthal M.D.   On: 11/11/2014 19:19   Medications: I have reviewed the patient's current medications. Scheduled Meds: . aspirin EC  81  mg Oral Daily  . atorvastatin  40 mg Oral q1800  . diltiazem  60 mg Oral 4 times per day  . furosemide  80 mg Intravenous BID  . insulin aspart  0-5 Units Subcutaneous QHS  . insulin aspart  0-9 Units Subcutaneous TID WC  . rivaroxaban  20 mg Oral QAC supper  . sodium chloride  3 mL Intravenous Q12H  . spironolactone  25 mg Oral Daily   Continuous Infusions:  PRN Meds:.albuterol, morphine injection Assessment/Plan:   Atrial fibrillation with rapid ventricular response - Still poorly controlled, it does appear that he has been compliant with 75 mg of metopolol BID. I think we should increase him to 200 mg of Metoprolol Succinate daily.  If that does not control him he may need the addition of digoxin. - He is currently rate controlled will  continue Cardizem PO for now. - Continue xarelto daily for A/C     Acute on chronic systolic CHF (congestive heart failure), NYHA class 3 -Lasix  IV BID, target at least 1L neg per day - Dry weight appears to be around 216 - Restart metoprolol on discharge -Daily weights and strict I and Os - spironolactone 25 mg daily  Chest pain -Trops remain at 0.04, do no suspect active cardiac ischemia, appears to be related to RVR versus CHF exacerbation.  It appears his last cath was in sept 2013 with dr [place] that showed : Left anterior descending (LAD): The LAD is a large vessel that extends around the apex. It has diffuse irregularities throughout up to 20-30% in the proximal and mid vessel. The distal vessel has 40% disease. The first and second diagonal branches are small in caliber. They each have 60-70% disease proximally.  Left circumflex (LCx): The left circumflex coronary is a large dominant vessel. It has 30-40% narrowing at the ostium. The first marginal branch has 30% disease proximally. There is 30% disease prior to the PDA.  Right coronary artery (RCA): The right coronary is a small nondominant vessel. 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 There is left to right collaterals to the distal vessel.  - He likely need to follow up with cardiology for repeat stress test versus elective cardiac cath. -Continue asa and Lipitor.   Dispo: Disposition is deferred at this time, awaiting improvement of current medical problems.  Anticipated discharge in approximately 2 day(s).   The patient does have a current PCP ([name], MD) and does not need an [facility] hospital follow-up appointment after discharge.  The patient does not have transportation limitations that hinder transportation to clinic appointments.  .Services Needed at time of discharge: Y = Yes, Blank = No PT:   OT:   RN:   Equipment:   Other:     LOS: 1 day   Gust Rung, DO 11/12/2014, 9:44 AM

## 2014-11-12 NOTE — Progress Notes (Signed)
Utilization review completed.  

## 2014-11-12 NOTE — Progress Notes (Signed)
Subjective: Stated that he came in for chest pain and difficulty breathing and was currently having a little chest pain, but the difficulty breathing was better as of one hour prior. He describes the chest pain as a pressure that localized the the L lower sternal border. During the exam he also complained of pain around the umbilicus. He notes that he feels like he has a lot of extra fluid on as well. No other complaints. Objective: Vital signs in last 24 hours: Filed Vitals:   11/11/14 2229 11/11/14 2329 11/12/14 0619 11/12/14 0641  BP: 102/84 147/89 137/94   Pulse: 108 106 102   Temp: 97.7 F (36.5 C) 98.2 F (36.8 C) 97.5 F (36.4 C)   TempSrc:  Oral    Resp: Height:   (1.626 m)    Weight:  100.835 kg (222 lb 4.8 oz)  100.154 kg (220 lb 12.8 oz)  SpO2: 93% 95% 93%    Weight change:   Intake/Output Summary (Last 24 hours) at 11/12/14 0849 Last data filed at 11/11/14 2319  Gross per 24 hour  Intake    240 ml  Output    575 ml  Net   -335 ml   BP 137/94 mmHg  Pulse 102  Temp(Src) 97.5 F (36.4 C) (Oral)  Resp 20  Ht  (1.626 m)  Wt 100.154 kg (220 lb 12.8 oz)  BMI 37.88 kg/m2  SpO2 93%  General Appearance:    Alert, cooperative, no distress, appears stated age  Head:    Normocephalic, without obvious abnormality, atraumatic  Throat:   Lips, mucosa, and tongue normal; missing upper and lower front teeth  Neck:   Supple, symmetrical, trachea midline, JVD present up to earlobe  Back:     Symmetric, no curvature, ROM normal, no CVA tenderness  Lungs:     Clear to auscultation on L, crackles present RLL, respirations slightly labored  Heart:    Irregularly irregular rhythm, S1 and S2 normal, no murmur, rub   or gallop  Abdomen:     Soft, tender above umbilicus  no masses, no organomegaly  Extremities:   Extremities atraumatic, no cyanosis, 1+ pitting edema present in LE bilaterally  Pulses:   2+ and symmetric all extremities  Skin:   Skin color,  texture, turgor normal, no rashes or lesions   Lab Results: Results for orders placed or performed during the hospital encounter of 11/11/14 (from the past 24 hour(s))  Basic metabolic panel     Status: Abnormal   Collection Time: 11/11/14  6:25 PM  Result Value Ref Range   Sodium 134 (L) 135 - 145 mmol/L   Potassium 4.2 3.5 - 5.1 mmol/L   Chloride 100 (L) 101 - 111 mmol/L   CO2 25 22 - 32 mmol/L   Glucose, Bld 233 (H) 65 - 99 mg/dL   BUN 22 (H) 6 - 20 mg/dL   Creatinine, Ser 1.61 0.61 - 1.24 mg/dL   Calcium 8.7 (L) 8.9 - 10.3 mg/dL   GFR calc non Af Amer >60 >60 mL/min   GFR calc Af Amer >60 >60 mL/min   Anion gap 9 5 - 15  CBC     Status: Abnormal   Collection Time: 11/11/14  6:25 PM  Result Value Ref Range   WBC 9.3 4.0 - 10.5 K/uL   RBC 5.34 4.22 - 5.81 MIL/uL   Hemoglobin 10.4 (L) 13.0 - 17.0 g/dL   HCT 09.6 (L) 04.5 - 40.9 %  MCV 68.7 (L) 78.0 - 100.0 fL   MCH 19.5 (L) 26.0 - 34.0 pg   MCHC 28.3 (L) 30.0 - 36.0 g/dL   RDW 89.2 (H) 11.9 - 41.7 %   Platelets 247 150 - 400 K/uL  Lipase, blood     Status: None   Collection Time: 11/11/14  6:25 PM  Result Value Ref Range   Lipase 27 22 - 51 U/L  Hepatic function panel     Status: Abnormal   Collection Time: 11/11/14  6:25 PM  Result Value Ref Range   Total Protein 7.1 6.5 - 8.1 g/dL   Albumin 3.3 (L) 3.5 - 5.0 g/dL   AST 18 15 - 41 U/L   ALT 12 (L) 17 - 63 U/L   Alkaline Phosphatase 116 38 - 126 U/L   Total Bilirubin 0.9 0.3 - 1.2 mg/dL   Bilirubin, Direct 0.4 0.1 - 0.5 mg/dL   Indirect Bilirubin 0.5 0.3 - 0.9 mg/dL  Brain natriuretic peptide     Status: Abnormal   Collection Time: 11/11/14  6:33 PM  Result Value Ref Range   B Natriuretic Peptide 807.3 (H) 0.0 - 100.0 pg/mL  I-stat troponin, ED     Status: None   Collection Time: 11/11/14  6:33 PM  Result Value Ref Range   Troponin i, poc 0.03 0.00 - 0.08 ng/mL   Comment 3          I-Stat Chem 8, ED     Status: Abnormal   Collection Time: 11/11/14  6:56 PM    Result Value Ref Range   Sodium 135 135 - 145 mmol/L   Potassium 4.2 3.5 - 5.1 mmol/L   Chloride 99 (L) 101 - 111 mmol/L   BUN 28 (H) 6 - 20 mg/dL   Creatinine, Ser 4.08 0.61 - 1.24 mg/dL   Glucose, Bld 144 (H) 65 - 99 mg/dL   Calcium, Ion 8.18 (L) 1.12 - 1.23 mmol/L   TCO2 23 0 - 100 mmol/L   Hemoglobin 13.3 13.0 - 17.0 g/dL   HCT 56.3 14.9 - 70.2 %  Glucose, capillary     Status: Abnormal   Collection Time: 11/11/14 11:12 PM  Result Value Ref Range   Glucose-Capillary 177 (H) 65 - 99 mg/dL  Troponin I     Status: Abnormal   Collection Time: 11/12/14  1:05 AM  Result Value Ref Range   Troponin I 0.04 (H) <0.031 ng/mL  Urine rapid drug screen (hosp performed)     Status: Abnormal   Collection Time: 11/12/14  1:35 AM  Result Value Ref Range   Opiates POSITIVE (A) NONE DETECTED   Cocaine NONE DETECTED NONE DETECTED   Benzodiazepines NONE DETECTED NONE DETECTED   Amphetamines NONE DETECTED NONE DETECTED   Tetrahydrocannabinol NONE DETECTED NONE DETECTED   Barbiturates NONE DETECTED NONE DETECTED  Glucose, capillary     Status: Abnormal   Collection Time: 11/12/14  6:35 AM  Result Value Ref Range   Glucose-Capillary 179 (H) 65 - 99 mg/dL   Micro Results: No results found for this or any previous visit (from the past 240 hour(s)). Studies/Results: Dg Chest Portable 1 View  11/11/2014   CLINICAL DATA:  Shortness of breath. Congestive heart failure. Coronary artery disease.  EXAM: PORTABLE CHEST - 1 VIEW  COMPARISON:  10/29/2014  FINDINGS: Mild to moderate cardiomegaly stable. Diffuse interstitial infiltrates show no significant change, consistent with interstitial edema. Small right pleural effusion is again demonstrated. Left costophrenic sulcus is not included within  the field of view on this exam.  Prior CABG noted. Single lead transvenous pacemaker remains in place.  IMPRESSION: Mild congestive heart failure and small right pleural effusion, without significant change.    Electronically Signed   By: Myles Rosenthal M.D.   On: 11/11/2014 19:19   Medications: I have reviewed the patient's current medications. Scheduled Meds: . aspirin EC  81 mg Oral Daily  . atorvastatin  40 mg Oral q1800  . diltiazem  60 mg Oral 4 times per day  . furosemide  80 mg Intravenous BID  . insulin aspart  0-5 Units Subcutaneous QHS  . insulin aspart  0-9 Units Subcutaneous TID WC  . rivaroxaban  20 mg Oral QAC supper  . sodium chloride  3 mL Intravenous Q12H  . spironolactone  25 mg Oral Daily   Continuous Infusions:  PRN Meds:.albuterol, morphine injection Assessment/Plan: Active Problems:   Atrial fibrillation with rapid ventricular response   Atrial fibrillation with RVR   Shortness of breath   Other chest pain   Acute on chronic systolic CHF (congestive heart failure), NYHA class 3  Atrial Fibrillation with Rapid Ventricular Response: Due to patient's recurrent episodes of RVR with his chronic atrial fibrillation, it appears that his rate control is inadequate at the moment, assuming medication compliance.  - Monitor on telemetry - Continue diltiazem 60mg  q6h PO - Consider increasing dose of metoprolol to 100mg  BID upon discharge - Continue rivaroxaban 20mg  daily - EKG today showed atrial fibrillation with QTc of 492  Acute on Chronic systolic CHF: Patient's weight was up to 220 lbs on admission, up from dry weight of 216. Likely exacerbation due to AFib with RVR - Patient currently on furosemide 80mg  IV BID - Continue spironolactone 25mg  daily  DM: - Hold home glipizide - SSI  CAD:  Admits to chest pain that is constant, EKG showing no abnormalities but given cardiac history a full ACS workup is warranted - Troponin 0.04 this morning x2, likely from CHF exacerbation - Continue ASA 81mg  - Restart lisinopril 2.5mg  daily  FEN/GI: - Diet: Heart Healthy  Prophylaxis: - Continue rivaroxaban 20mg  daily  Dispo: Disposition is deferred at this time, awaiting  improvement of current medical problems. Anticipated discharge in approximately 1-2 day(s).   This is a Psychologist, occupational Note.  The care of the patient was discussed with Dr. Carlynn Purl and the assessment and plan formulated with their assistance.  Please see their attached note for official documentation of the daily encounter.   LOS: 1 day   Eyvonne Mechanic, Med Student 11/12/2014, 8:49 AM

## 2014-11-13 LAB — GLUCOSE, CAPILLARY
GLUCOSE-CAPILLARY: 167 mg/dL — AB (ref 65–99)
GLUCOSE-CAPILLARY: 202 mg/dL — AB (ref 65–99)
GLUCOSE-CAPILLARY: 276 mg/dL — AB (ref 65–99)
Glucose-Capillary: 221 mg/dL — ABNORMAL HIGH (ref 65–99)

## 2014-11-13 LAB — BASIC METABOLIC PANEL
ANION GAP: 11 (ref 5–15)
BUN: 27 mg/dL — AB (ref 6–20)
CHLORIDE: 94 mmol/L — AB (ref 101–111)
CO2: 25 mmol/L (ref 22–32)
Calcium: 8.2 mg/dL — ABNORMAL LOW (ref 8.9–10.3)
Creatinine, Ser: 1.23 mg/dL (ref 0.61–1.24)
Glucose, Bld: 196 mg/dL — ABNORMAL HIGH (ref 65–99)
POTASSIUM: 3.8 mmol/L (ref 3.5–5.1)
SODIUM: 130 mmol/L — AB (ref 135–145)

## 2014-11-13 MED ORDER — FUROSEMIDE 10 MG/ML IJ SOLN
80.0000 mg | Freq: Three times a day (TID) | INTRAMUSCULAR | Status: DC
  Administered 2014-11-13 (×2): 80 mg via INTRAVENOUS
  Filled 2014-11-13 (×2): qty 8

## 2014-11-13 NOTE — Progress Notes (Signed)
Subjective: Remains chest pain free, does report an episode of SOB last night that improved with a breathing treatment. Objective: Vital signs in last 24 hours: Filed Vitals:   11/12/14 1529 11/12/14 2001 11/12/14 2153 11/13/14 0414  BP:  121/67  100/71  Pulse:  94  104  Temp:  98 F (36.7 C)  98.3 F (36.8 C)  TempSrc:  Oral  Oral  Resp:  18  20  Height:      Weight:      SpO2: 96% 93% 96% 95%   Weight change:   Intake/Output Summary (Last 24 hours) at 11/13/14 1037 Last data filed at 11/13/14 0700  Gross per 24 hour  Intake    760 ml  Output    900 ml  Net   -140 ml   General: resting in bed HEENT: PERRL, EOMI, no scleral icterus Cardiac: irr irr, no rubs, murmurs or gallops Pulm: RLL crackles, moving normal volumes of air Abd: soft, mildly tender sub sternal, nondistended, BS present Ext: warm and well perfused, 1+ pedal edema Neuro: alert and oriented X3  Lab Results: Basic Metabolic Panel:  Recent Labs Lab 11/12/14 0850 11/13/14 0500  NA 131* 130*  K 3.9 3.8  CL 97* 94*  CO2 25 25  GLUCOSE 208* 196*  BUN 23* 27*  CREATININE 1.22 1.23  CALCIUM 8.5* 8.2*   Liver Function Tests:  Recent Labs Lab 11/11/14 1825  AST 18  ALT 12*  ALKPHOS 116  BILITOT 0.9  PROT 7.1  ALBUMIN 3.3*    Recent Labs Lab 11/11/14 1825  LIPASE 27   No results for input(s): AMMONIA in the last 168 hours. CBC:  Recent Labs Lab 11/11/14 1825 11/11/14 1856 11/12/14 0850  WBC 9.3  --  9.0  HGB 10.4* 13.3 10.6*  HCT 36.7* 39.0 37.8*  MCV 68.7*  --  69.0*  PLT 247  --  250   Cardiac Enzymes:  Recent Labs Lab 11/12/14 0105 11/12/14 0850  TROPONINI 0.04* 0.04*   BNP: No results for input(s): PROBNP in the last 168 hours. D-Dimer: No results for input(s): DDIMER in the last 168 hours. CBG:  Recent Labs Lab 11/11/14 2312 11/12/14 0635 11/12/14 1122 11/12/14 1620 11/12/14 2106 11/13/14 0616  GLUCAP 177* 179* 287* 170* 190* 167*   Urine Drug  Screen: Drugs of Abuse     Component Value Date/Time   LABOPIA POSITIVE* 11/12/2014 0135   COCAINSCRNUR NONE DETECTED 11/12/2014 0135   LABBENZ NONE DETECTED 11/12/2014 0135   AMPHETMU NONE DETECTED 11/12/2014 0135   THCU NONE DETECTED 11/12/2014 0135   LABBARB NONE DETECTED 11/12/2014 0135     Micro Results: No results found for this or any previous visit (from the past 240 hour(s)). Studies/Results: X-ray Chest Pa And Lateral  11/12/2014   CLINICAL DATA:  Short of breath  EXAM: CHEST  2 VIEW  COMPARISON:  11/11/2014  FINDINGS: Stable left subclavian AICD device. Stable pulmonary edema. Bilateral pleural effusions right greater than left. No pneumothorax.  IMPRESSION: Stable CHF and bilateral pleural effusions right greater than left.   Electronically Signed   By: Jolaine Click M.D.   On: 11/12/2014 11:51   Dg Chest Portable 1 View  11/11/2014   CLINICAL DATA:  Shortness of breath. Congestive heart failure. Coronary artery disease.  EXAM: PORTABLE CHEST - 1 VIEW  COMPARISON:  10/29/2014  FINDINGS: Mild to moderate cardiomegaly stable. Diffuse interstitial infiltrates show no significant change, consistent with interstitial edema. Small right pleural effusion is  again demonstrated. Left costophrenic sulcus is not included within the field of view on this exam.  Prior CABG noted. Single lead transvenous pacemaker remains in place.  IMPRESSION: Mild congestive heart failure and small right pleural effusion, without significant change.   Electronically Signed   By: Myles Rosenthal M.D.   On: 11/11/2014 19:19   Medications: I have reviewed the patient's current medications. Scheduled Meds: . aspirin EC  81 mg Oral Daily  . atorvastatin  40 mg Oral q1800  . diltiazem  60 mg Oral 4 times per day  . furosemide  80 mg Intravenous TID  . insulin aspart  0-5 Units Subcutaneous QHS  . insulin aspart  0-9 Units Subcutaneous TID WC  . rivaroxaban  20 mg Oral QAC supper  . sodium chloride  3 mL  Intravenous Q12H  . spironolactone  25 mg Oral Daily   Continuous Infusions:  PRN Meds:.albuterol, morphine injection Assessment/Plan:   Atrial fibrillation  - He is currently rate controlled will continue Cardizem PO for now.  Will transition back to metoprolol at a high dose near or at discharge. - Continue xarelto daily for A/C    Acute on chronic systolic CHF (congestive heart failure), NYHA class 3 -Lasix  IV BID, target at least 1L neg per day (patient reports good UOP but I&O does not show as much responce - Dry weight appears to be around 216 - Restart metoprolol on discharge -Daily weights and strict I and Os - spironolactone 25 mg daily  Chest pain - Resolved - He likely need to follow up with cardiology for repeat stress test versus elective cardiac cath. -Continue asa and Lipitor.   Dispo: Disposition is deferred at this time, awaiting improvement of current medical problems.  Anticipated discharge in approximately 1 day(s).   The patient does have a current PCP ([name], MD) and does not need an [facility] hospital follow-up appointment after discharge.  The patient does not have transportation limitations that hinder transportation to clinic appointments.  .Services Needed at time of discharge: Y = Yes, Blank = No PT:   OT:   RN:   Equipment:   Other:     LOS: 2 days   Gust Rung, DO 11/13/2014, 10:37 AM

## 2014-11-13 NOTE — Progress Notes (Signed)
Subjective: Patient noted that he did not have a good night and couldn't sleep, largely due to difficulty breathing. He states that his breathing is about the samea s it was yesterday. He also said that his fluid is down a bit, and that he still has some left. No chest pain.  Objective: Vital signs in last 24 hours: Filed Vitals:   11/12/14 1529 11/12/14 2001 11/12/14 2153 11/13/14 0414  BP:  121/67  100/71  Pulse:  94  104  Temp:  98 F (36.7 C)  98.3 F (36.8 C)  TempSrc:  Oral  Oral  Resp:  18  20  Height:      Weight:      SpO2: 96% 93% 96% 95%   Weight change:   Intake/Output Summary (Last 24 hours) at 11/13/14 1021 Last data filed at 11/13/14 0700  Gross per 24 hour  Intake    760 ml  Output    900 ml  Net   -140 ml   BP 100/71 mmHg  Pulse 104  Temp(Src) 98.3 F (36.8 C) (Oral)  Resp 20  Ht 5\' 4"  (1.626 m)  Wt 100.154 kg (220 lb 12.8 oz)  BMI 37.88 kg/m2  SpO2 95%  General Appearance:    Alert, cooperative, no distress, appears stated age  Head:    Normocephalic, without obvious abnormality, atraumatic  Back:     Symmetric, no curvature, ROM normal  Lungs:     Clear to auscultation bilaterally, respirations unlabored  Chest wall:    No tenderness or deformity  Heart:    Regular rate and rhythm, S1 and S2 normal, no murmur, rub   or gallop  Abdomen:     Soft, non-tender,  Extremities:   Extremities normal, atraumatic, no cyanosis. Trace bilateral LE edema  Skin:   Skin color, texture, turgor normal, no rashes or lesions   Lab Results: Results for orders placed or performed during the hospital encounter of 11/11/14 (from the past 24 hour(s))  Glucose, capillary     Status: Abnormal   Collection Time: 11/12/14 11:22 AM  Result Value Ref Range   Glucose-Capillary 287 (H) 65 - 99 mg/dL  Glucose, capillary     Status: Abnormal   Collection Time: 11/12/14  4:20 PM  Result Value Ref Range   Glucose-Capillary 170 (H) 65 - 99 mg/dL   Comment 1 Notify RN    Comment 2 Document in Chart   Glucose, capillary     Status: Abnormal   Collection Time: 11/12/14  9:06 PM  Result Value Ref Range   Glucose-Capillary 190 (H) 65 - 99 mg/dL  Basic metabolic panel Once     Status: Abnormal   Collection Time: 11/13/14  5:00 AM  Result Value Ref Range   Sodium 130 (L) 135 - 145 mmol/L   Potassium 3.8 3.5 - 5.1 mmol/L   Chloride 94 (L) 101 - 111 mmol/L   CO2 25 22 - 32 mmol/L   Glucose, Bld 196 (H) 65 - 99 mg/dL   BUN 27 (H) 6 - 20 mg/dL   Creatinine, Ser 7.82 0.61 - 1.24 mg/dL   Calcium 8.2 (L) 8.9 - 10.3 mg/dL   GFR calc non Af Amer >60 >60 mL/min   GFR calc Af Amer >60 >60 mL/min   Anion gap 11 5 - 15  Glucose, capillary     Status: Abnormal   Collection Time: 11/13/14  6:16 AM  Result Value Ref Range   Glucose-Capillary 167 (H) 65 - 99  mg/dL   Micro Results: No results found for this or any previous visit (from the past 240 hour(s)). Studies/Results: X-ray Chest Pa And Lateral  11/12/2014   CLINICAL DATA:  Short of breath  EXAM: CHEST  2 VIEW  COMPARISON:  11/11/2014  FINDINGS: Stable left subclavian AICD device. Stable pulmonary edema. Bilateral pleural effusions right greater than left. No pneumothorax.  IMPRESSION: Stable CHF and bilateral pleural effusions right greater than left.   Electronically Signed   By: Jolaine Click M.D.   On: 11/12/2014 11:51   Dg Chest Portable 1 View  11/11/2014   CLINICAL DATA:  Shortness of breath. Congestive heart failure. Coronary artery disease.  EXAM: PORTABLE CHEST - 1 VIEW  COMPARISON:  10/29/2014  FINDINGS: Mild to moderate cardiomegaly stable. Diffuse interstitial infiltrates show no significant change, consistent with interstitial edema. Small right pleural effusion is again demonstrated. Left costophrenic sulcus is not included within the field of view on this exam.  Prior CABG noted. Single lead transvenous pacemaker remains in place.  IMPRESSION: Mild congestive heart failure and small right pleural  effusion, without significant change.   Electronically Signed   By: Myles Rosenthal M.D.   On: 11/11/2014 19:19   Medications: I have reviewed the patient's current medications. Scheduled Meds: . aspirin EC  81 mg Oral Daily  . atorvastatin  40 mg Oral q1800  . diltiazem  60 mg Oral 4 times per day  . furosemide  80 mg Intravenous TID  . insulin aspart  0-5 Units Subcutaneous QHS  . insulin aspart  0-9 Units Subcutaneous TID WC  . rivaroxaban  20 mg Oral QAC supper  . sodium chloride  3 mL Intravenous Q12H  . spironolactone  25 mg Oral Daily   Continuous Infusions:  PRN Meds:.albuterol, morphine injection Assessment/Plan: Active Problems:   Atrial fibrillation with rapid ventricular response   Atrial fibrillation with RVR   Shortness of breath   Other chest pain   Acute on chronic systolic CHF (congestive heart failure), NYHA class 3  Atrial Fibrillation with Rapid Ventricular Response: Due to patient's recurrent episodes of RVR with his chronic atrial fibrillation, it appears that his rate control is inadequate at the moment, assuming medication compliance.  - Monitor on telemetry - Continue diltiazem 60mg  q6h PO - Consider increasing dose of metoprolol to 100mg  BID upon discharge - Continue rivaroxaban 20mg  daily - Patient was noted to have a few brief episodes of VTach yesterday on telemetry (less than 15 beats each). Will continue to monitor  Acute on Chronic systolic CHF: Patient's weight was up to 220 lbs on admission, up from dry weight of 216. Likely exacerbation due to AFib with RVR - Increase furosemide 80mg  IV  To three times a day - Continue spironolactone 25mg  daily  DM: - Hold home glipizide - SSI  CAD: Admits to chest pain that is constant, EKG showing no abnormalities but given cardiac history a full ACS workup is warranted - Troponin 0.04 this morning x2, likely from CHF exacerbation - Continue ASA 81mg  - Restart lisinopril 2.5mg  daily  FEN/GI: - Diet:  Heart Healthy  Prophylaxis: - Continue rivaroxaban 20mg  daily  Dispo: Disposition is deferred at this time, awaiting improvement of current medical problems. Anticipated discharge in approximately 1-2 day(s).   This is a Psychologist, occupational Note.  The care of the patient was discussed with Dr. Carlynn Purl and the assessment and plan formulated with their assistance.  Please see their attached note for official documentation of the  daily encounter.   LOS: 2 days   Eyvonne Mechanic, Med Student 11/13/2014, 10:21 AM

## 2014-11-13 NOTE — Progress Notes (Signed)
Patient refused morning AM weight.  RN educated patient on the importance of monitoring his daily weight.  Patient continued to refuse daily weight stating "lady get out my room, im sleeping"  Call bell with in reach.  Rn will continue to monitor

## 2014-11-13 NOTE — Progress Notes (Signed)
Patient refused pm scheduled dose of Lasix 80 mg IV.  Patient stated "i will not be up all night going to the bathroom". RN educated patient on the importance of lasix and its function.   Patient continued to refused. Patient resting in bed.  Call bell with in reach.  RN will continue to monitor

## 2014-11-14 LAB — GLUCOSE, CAPILLARY
GLUCOSE-CAPILLARY: 232 mg/dL — AB (ref 65–99)
GLUCOSE-CAPILLARY: 246 mg/dL — AB (ref 65–99)

## 2014-11-14 LAB — BASIC METABOLIC PANEL
Anion gap: 9 (ref 5–15)
BUN: 30 mg/dL — ABNORMAL HIGH (ref 6–20)
CHLORIDE: 95 mmol/L — AB (ref 101–111)
CO2: 30 mmol/L (ref 22–32)
Calcium: 8.7 mg/dL — ABNORMAL LOW (ref 8.9–10.3)
Creatinine, Ser: 1.23 mg/dL (ref 0.61–1.24)
GFR calc non Af Amer: >60 mL/min (ref >60)
Glucose, Bld: 231 mg/dL — ABNORMAL HIGH (ref 65–99)
POTASSIUM: 4.7 mmol/L (ref 3.5–5.1)
SODIUM: 134 mmol/L — AB (ref 135–145)

## 2014-11-14 LAB — HEMOGLOBIN A1C
Hgb A1c MFr Bld: 8.1 % — ABNORMAL HIGH (ref 4.8–5.6)
MEAN PLASMA GLUCOSE: 186 mg/dL

## 2014-11-14 MED ORDER — METOPROLOL TARTRATE 100 MG PO TABS
100.0000 mg | ORAL_TABLET | Freq: Two times a day (BID) | ORAL | Status: DC
Start: 2014-11-14 — End: 2014-12-05

## 2014-11-14 MED ORDER — METOPROLOL TARTRATE 100 MG PO TABS
100.0000 mg | ORAL_TABLET | Freq: Two times a day (BID) | ORAL | Status: DC
  Administered 2014-11-14: 100 mg via ORAL
  Filled 2014-11-14: qty 1

## 2014-11-14 MED ORDER — FUROSEMIDE 80 MG PO TABS
80.0000 mg | ORAL_TABLET | Freq: Two times a day (BID) | ORAL | Status: DC
  Administered 2014-11-14: 80 mg via ORAL
  Filled 2014-11-14: qty 1

## 2014-11-14 NOTE — Progress Notes (Signed)
11/14/2014 1:45 PM Discharge AVS meds taken today and those due this evening reviewed.  Follow-up appointments and when to call md reviewed.  D/C IV and TELE.  Questions and concerns addressed.   D/C home per orders. Kathryne Hitch

## 2014-11-14 NOTE — Discharge Instructions (Signed)
Please continue to take your medications as we have prescribed.  Please also follow up at the [facility] for your medical management as well as for help on securing stable housing.  It is important that you keep your appointments and try to stay in contact with your physicians and social workers to help you stay healthy.

## 2014-11-14 NOTE — Progress Notes (Signed)
Subjective: The patient was feeling much better today, with an improvement in breathing and exercise tolerance. He denied any chest pain. He mentioned that his swelling had gone down greatly in his legs. He refused Lasix last night since he didn't want to be up during the night urinating. He also refused morning labs because he was tired. He is agreeable to getting labs drawn now and agrees with discharge plans today.  Objective: Vital signs in last 24 hours: Filed Vitals:   11/13/14 0414 11/13/14 1440 11/13/14 1924 11/14/14 0445  BP: 100/71 130/73 117/42 91/58  Pulse: 104 88 93 100  Temp: 98.3 F (36.8 C) 98 F (36.7 C) 98 F (36.7 C) 97.5 F (36.4 C)  TempSrc: Oral Oral Oral Oral  Resp: Height:   (1.626 m)    Weight:  97.886 kg (215 lb 12.8 oz)  96.525 kg (212 lb 12.8 oz)  SpO2: 95% 95% 99% 98%   Weight change:   Intake/Output Summary (Last 24 hours) at 11/14/14 0855 Last data filed at 11/14/14 0700  Gross per 24 hour  Intake   1560 ml  Output   3700 ml  Net  -2140 ml   BP 91/58 mmHg  Pulse 100  Temp(Src) 97.5 F (36.4 C) (Oral)  Resp 24  Ht  (1.626 m)  Wt 96.525 kg (212 lb 12.8 oz)  BMI 36.51 kg/m2  SpO2 98%  General Appearance:    Alert, cooperative, no distress, appears stated age  Head:    Normocephalic, without obvious abnormality, atraumatic  Lungs:     Clear to auscultation bilaterally, respirations unlabored  Chest wall:    No tenderness or deformity  Heart:    Irregularly irregular  rhythm, S1 and S2 normal, no murmur, rub   or gallop  Abdomen:     Soft, non-tender, bowel sounds active all four quadrants,    no masses, no organomegaly  Extremities:   Extremities normal, atraumatic, no cyanosis or edema  Skin:   Skin color, texture, turgor normal, no rashes or lesions   Lab Results: Results for orders placed or performed during the hospital encounter of 11/11/14 (from the past 24 hour(s))  Glucose, capillary     Status: Abnormal   Collection Time: 11/13/14 11:17 AM  Result Value Ref Range   Glucose-Capillary 202 (H) 65 - 99 mg/dL   Comment 1 Notify RN   Glucose, capillary     Status: Abnormal   Collection Time: 11/13/14  4:21 PM  Result Value Ref Range   Glucose-Capillary 276 (H) 65 - 99 mg/dL   Comment 1 Notify RN    Comment 2 Document in Chart   Glucose, capillary     Status: Abnormal   Collection Time: 11/13/14  8:35 PM  Result Value Ref Range   Glucose-Capillary 221 (H) 65 - 99 mg/dL  Glucose, capillary     Status: Abnormal   Collection Time: 11/14/14  7:08 AM  Result Value Ref Range   Glucose-Capillary 232 (H) 65 - 99 mg/dL   Micro Results: No results found for this or any previous visit (from the past 240 hour(s)). Studies/Results: No results found. Medications: I have reviewed the patient's current medications. Scheduled Meds: . aspirin EC  81 mg Oral Daily  . atorvastatin  40 mg Oral q1800  . diltiazem  60 mg Oral 4 times per day  . furosemide  80 mg Oral BID  . insulin aspart  0-5 Units Subcutaneous QHS  .  insulin aspart  0-9 Units Subcutaneous TID WC  . rivaroxaban  20 mg Oral QAC supper  . sodium chloride  3 mL Intravenous Q12H  . spironolactone  25 mg Oral Daily   Continuous Infusions:  PRN Meds:.albuterol, morphine injection Assessment/Plan: Active Problems:   Atrial fibrillation with rapid ventricular response   Atrial fibrillation with RVR   Shortness of breath   Other chest pain   Acute on chronic systolic CHF (congestive heart failure), NYHA class 3  Atrial Fibrillation with Rapid Ventricular Response: Due to patient's recurrent episodes of RVR with his chronic atrial fibrillation, it appears that his rate control is inadequate at the moment, assuming medication compliance.  - Monitor on telemetry - Start metoprolol 100mg  BID - Continue rivaroxaban 20mg  daily - No more episodes of NSVT since 8/20  Acute on Chronic systolic CHF: Patient's weight was up to 220 lbs on  admission, up from dry weight of 216. Likely exacerbation due to AFib with RVR - Lasix 80mg  PO BID - Resume home dose of Lasix 120mg  PO in the morning and 80mg  PO at night - Continue spironolactone 25mg  daily - Current weight 212 lbs  DM: - Hold home glipizide - SSI  CAD: Admits to chest pain that is constant, EKG showing no abnormalities but given cardiac history a full ACS workup is warranted - Troponins slightly elevated at 0.04 on 8/20, likely from CHF exacerbation - Continue ASA 81mg  - Lisinopril 2.5 mg daily  FEN/GI: - Diet: Heart Healthy  Prophylaxis: - Continue rivaroxaban 20mg  daily  Dispo: Discharge today. LCSW consulted for housing situation  This is a Psychologist, occupational Note.  The care of the patient was discussed with Dr. Darreld Mclean and the assessment and plan formulated with their assistance.  Please see their attached note for official documentation of the daily encounter.   LOS: 3 days   Eyvonne Mechanic, Med Student 11/14/2014, 8:55 AM

## 2014-11-14 NOTE — Progress Notes (Signed)
Subjective: Patient states that he is feeling good and his "chest pain is gone." He does endorse a little bit of SOB. States he is urinating well and that his lower extremity swelling is improved. Objective: Vital signs in last 24 hours: Filed Vitals:   11/13/14 0414 11/13/14 1440 11/13/14 1924 11/14/14 0445  BP: 100/71 130/73 117/42 91/58  Pulse: 104 88 93 100  Temp: 98.3 F (36.8 C) 98 F (36.7 C) 98 F (36.7 C) 97.5 F (36.4 C)  TempSrc: Oral Oral Oral Oral  Resp: _0 Height:  _1  (1.626 m)    Weight:  215 lb 12.8 oz (97.886 kg)  212 lb 12.8 oz (96.525 kg)  SpO2: 95% 95% 99% 98%   Weight change:   Intake/Output Summary (Last 24 hours) at 11/14/14 1216 Last data filed at 11/14/14 0936  Gross per 24 hour  Intake   1563 ml  Output   3300 ml  Net  -1737 ml   General: resting in bed HEENT: EOMI, no scleral icterus Cardiac: irregularly irregular, no rubs, murmurs or gallops Pulm: CTA bilaterally, moving normal volumes of air Abd: soft, non-tender, nondistended, BS present Ext: warm and well perfused, no edema Neuro: alert and oriented X3  Lab Results: Basic Metabolic Panel:  Recent Labs Lab 11/13/14 0500 11/14/14 1028  NA 130* 134*  K 3.8 4.7  CL 94* 95*  CO2 25 30  GLUCOSE 196* 231*  BUN 27* 30*  CREATININE 1.23 1.23  CALCIUM 8.2* 8.7*   Liver Function Tests:  Recent Labs Lab 11/11/14 1825  AST 18  ALT 12*  ALKPHOS 116  BILITOT 0.9  PROT 7.1  ALBUMIN 3.3*    Recent Labs Lab 11/11/14 1825  LIPASE 27   No results for input(s): AMMONIA in the last 168 hours. CBC:  Recent Labs Lab 11/11/14 1825 11/11/14 1856 11/12/14 0850  WBC 9.3  --  9.0  HGB 10.4* 13.3 10.6*  HCT 36.7* 39.0 37.8*  MCV 68.7*  --  69.0*  PLT 247  --  250   Cardiac Enzymes:  Recent Labs Lab 11/12/14 0105 11/12/14 0850  TROPONINI 0.04* 0.04*   BNP: No results for input(s): PROBNP in the last 168 hours. D-Dimer: No results for input(s): DDIMER in  the last 168 hours. CBG:  Recent Labs Lab 11/13/14 0616 11/13/14 1117 11/13/14 1621 11/13/14 2035 11/14/14 0708 11/14/14 1125  GLUCAP 167* 202* 276* 221* 232* 246*   Urine Drug Screen: Drugs of Abuse     Component Value Date/Time   LABOPIA POSITIVE* 11/12/2014 0135   COCAINSCRNUR NONE DETECTED 11/12/2014 0135   LABBENZ NONE DETECTED 11/12/2014 0135   AMPHETMU NONE DETECTED 11/12/2014 0135   THCU NONE DETECTED 11/12/2014 0135   LABBARB NONE DETECTED 11/12/2014 0135     Micro Results: No results found for this or any previous visit (from the past 240 hour(s)). Studies/Results: No results found. Medications: I have reviewed the patient's current medications. Scheduled Meds: . aspirin EC  81 mg Oral Daily  . atorvastatin  40 mg Oral q1800  . furosemide  80 mg Oral BID  . insulin aspart  0-5 Units Subcutaneous QHS  . insulin aspart  0-9 Units Subcutaneous TID WC  . metoprolol tartrate  100 mg Oral BID  . rivaroxaban  20 mg Oral QAC supper  . sodium chloride  3 mL Intravenous Q12H  . spironolactone  25 mg Oral Daily   Continuous Infusions:  PRN Meds:.albuterol, morphine injection Assessment/Plan:  Atrial fibrillation  - He is currently rate controlled will continue Cardizem PO for now.  Will transition back to metoprolol at a high dose near or at discharge. -CHADSVASc of 4 with yearly stroke risk of 4.8% - Continue xarelto daily for A/C - Start metoprolol tartrate 100 mg BID - D/C Diltiazem -f/u BMP    Acute on chronic systolic CHF (congestive heart failure), NYHA class 3 Patient refused last nights IV Lasix as he did not want to have to continuously go to the bathroom all night. Also refused this morning's blood draw. -Switch IV Lasix to PO 80 mg BID - Dry weight appears to be around 216, current weight at 212 down from 222 on admission - Daily weights and strict I and Os - spironolactone 25 mg daily  Chest pain - Resolved - He will likely need to follow up  with cardiology for repeat stress test versus elective cardiac cath. - Continue asa 81 mg and Lipitor 40 mg daily.  Homelessness: CSW has spoken to the patient for which we are appreciative. Patient had been to [facility] but is not a candidate to return. He refuses to go to homeless shelter. He has met with [facility] who is attempting to help patient find stable housing options. Patient does not know where he will go after discharge but feels safe leaving and finding shelter with family/friends.     Dispo: Disposition is deferred at this time, awaiting improvement of current medical problems.  Anticipated discharge in approximately 0-1 day(s).   The patient does have a current PCP (Tresa Garter, MD) and does not need an [facility] hospital follow-up appointment after discharge.  The patient does not have transportation limitations that hinder transportation to clinic appointments.  .Services Needed at time of discharge: Y = Yes, Blank = No PT:   OT:   RN:   Equipment:   Other:     LOS: 3 days   Zada Finders, MD 11/14/2014, 12:16 PM

## 2014-11-14 NOTE — Progress Notes (Signed)
Patient refused AM lab draw.  RN educated patient on importance of lab draws.  Patient continued to refuse.  RN will continue to monitor

## 2014-11-14 NOTE — Progress Notes (Signed)
CSW informed that pt is homeless and has had CSW following from [facility] and pt had mentioned [facility]- MD requested CSW involvement to help determine pt DC plan.    CSW contacted [facility] Geographical information systems officer- informed that pt has been to [facility] in the past and is not a candidate to return.  Pt was referred to [facility] during previous admission and is now being followed by a case worker- they met last week at [employer]-  That case worker is following patient for placement needs and is helping to find pt more permanent housing.  At this time pt does not have housing to DC to but pt reports he feels safe leaving and will find housing with friends/family or his car.  CSW signing off at this time.  Domenica Reamer, [place] Social Worker [phone]

## 2014-11-14 NOTE — Progress Notes (Signed)
Inpatient Diabetes Program Recommendations  AACE/ADA: New Consensus Statement on Inpatient Glycemic Control (2013)  Target Ranges:  Prepandial:   less than 140 mg/dL      Peak postprandial:   less than 180 mg/dL (1-2 hours)      Critically ill patients:  140 - 180 mg/dL   Results for BRILEY, TOWERS (MRN [id number]) as of 11/14/2014 08:30  Ref. Range 11/13/2014 06:16 11/13/2014 11:17 11/13/2014 16:21 11/13/2014 20:35 11/14/2014 07:08  Glucose-Capillary Latest Ref Range: 65-99 mg/dL 155 (H) 208 (H) 022 (H) 221 (H) 232 (H)   Reason for Admission: Acute on Chronic CHF  Diabetes history: DM2 Outpatient Diabetes medications: Glipizide 10 mg BID Current orders for Inpatient glycemic control: Novolog Sensitive + HS scale  Inpatient Diabetes Program Recommendations Correction (SSI): Glucose in the 200's, Please consider increasing correction to Novolog Moderate Correction TID.  Thanks,  Christena Deem RN, MSN, [facility] Inpatient Diabetes Coordinator Team Pager [phone] (8am-5pm)

## 2014-11-14 NOTE — Discharge Summary (Signed)
Name: Jacob [name] MRN: [id number] DOB: [birth year] 59 y.o. PCP: [name], MD  Date of Admission: 11/11/2014  6:13 PM Date of Discharge: 11/14/2014 Attending Physician: Tyson Alias, MD  Discharge Diagnosis: 1. Atrial fibrillation w/ RVR Active Problems:   Atrial fibrillation with rapid ventricular response   Shortness of breath   Other chest pain   Acute on chronic systolic CHF (congestive heart failure), NYHA class 3  Discharge Medications:   Medication List    TAKE these medications        albuterol (2.5 MG/3ML) 0.083% nebulizer solution  Commonly known as:  PROVENTIL  Take 3 mLs (2.5 mg total) by nebulization every 6 (six) hours as needed for wheezing or shortness of breath.     albuterol 108 (90 BASE) MCG/ACT inhaler  Commonly known as:  PROVENTIL HFA;VENTOLIN HFA  Inhale 2 puffs into the lungs every 6 (six) hours as needed for wheezing or shortness of breath.     aspirin 81 MG EC tablet  Take 1 tablet (81 mg total) by mouth daily.     atorvastatin 40 MG tablet  Commonly known as:  LIPITOR  Take 1 tablet (40 mg total) by mouth daily at 6 PM.     docusate sodium 100 MG capsule  Commonly known as:  COLACE  Take 1 capsule (100 mg total) by mouth daily.     ferrous sulfate 325 (65 FE) MG tablet  Take 1 tablet (325 mg total) by mouth 3 (three) times daily with meals.     furosemide 40 MG tablet  Commonly known as:  LASIX  3 tabs (120mg ) in the morning and 2 tabs (80mg ) in the evening     glipiZIDE 10 MG tablet  Commonly known as:  GLUCOTROL  Take 1 tablet (10 mg total) by mouth 2 (two) times daily before a meal.     lisinopril 2.5 MG tablet  Commonly known as:  PRINIVIL,ZESTRIL  Take 1 tablet (2.5 mg total) by mouth daily.     metoprolol 100 MG tablet  Commonly known as:  LOPRESSOR  Take 1 tablet (100 mg total) by mouth 2 (two) times daily.     rivaroxaban 20 MG Tabs tablet  Commonly known as:  XARELTO  Take 1 tablet (20 mg total) by  mouth daily before supper.     spironolactone 25 MG tablet  Commonly known as:  ALDACTONE  Take 1 tablet (25 mg total) by mouth daily.     traMADol 50 MG tablet  Commonly known as:  ULTRAM  Take 1 tablet (50 mg total) by mouth every 8 (eight) hours as needed for moderate pain.        Disposition and follow-up:   [name] was discharged from [facility] in Good condition.  At the hospital follow up visit please address:  1.  Chest Pain: Patient had no chest pain on discharge, please follow up on symptoms. Consider Cardiology referral for stress test versus repeat catheter.  Medication: We have increased his Metoprolol tartrate to 100 mg BID. Can consider switching to Metoprolol succinate.  Social: Please continue to have patient follow up with clinical social worker to help establish a more permament housing option. Patient refuses to go to homeless shelter and is not a candidate to return to [employer].  2.  Labs / imaging needed at time of follow-up: none  3.  Pending labs/ test needing follow-up: none  Follow-up Appointments: Follow-up Information    Follow  up with Jaclyn Shaggy, MD. Go on 11/17/2014.   Specialty:  Family Medicine   Why:  Please go to your appointment at 12:00 PM.   Contact information:   [address] [place] [address] [phone]       Discharge Instructions: Discharge Instructions    (HEART FAILURE PATIENTS) Call MD:  Anytime you have any of the following symptoms: 1) 3 pound weight gain in 24 hours or 5 pounds in 1 week 2) shortness of breath, with or without a dry hacking cough 3) swelling in the hands, feet or stomach 4) if you have to sleep on extra pillows at night in order to breathe.    Complete by:  As directed      Call MD for:  extreme fatigue    Complete by:  As directed      Call MD for:  persistant dizziness or light-headedness    Complete by:  As directed      Call MD for:  severe uncontrolled pain     Complete by:  As directed      Call MD for:  temperature >100.4    Complete by:  As directed      Diet - low sodium heart healthy    Complete by:  As directed      Increase activity slowly    Complete by:  As directed            Consultations:  n/a  Procedures Performed:  X-ray Chest Pa And Lateral  11/12/2014   CLINICAL DATA:  Short of breath  EXAM: CHEST  2 VIEW  COMPARISON:  11/11/2014  FINDINGS: Stable left subclavian AICD device. Stable pulmonary edema. Bilateral pleural effusions right greater than left. No pneumothorax.  IMPRESSION: Stable CHF and bilateral pleural effusions right greater than left.   Electronically Signed   By: Jolaine Click M.D.   On: 11/12/2014 11:51   Dg Chest Portable 1 View  11/11/2014   CLINICAL DATA:  Shortness of breath. Congestive heart failure. Coronary artery disease.  EXAM: PORTABLE CHEST - 1 VIEW  COMPARISON:  10/29/2014  FINDINGS: Mild to moderate cardiomegaly stable. Diffuse interstitial infiltrates show no significant change, consistent with interstitial edema. Small right pleural effusion is again demonstrated. Left costophrenic sulcus is not included within the field of view on this exam.  Prior CABG noted. Single lead transvenous pacemaker remains in place.  IMPRESSION: Mild congestive heart failure and small right pleural effusion, without significant change.   Electronically Signed   By: Myles Rosenthal M.D.   On: 11/11/2014 19:19   Dg Chest Port 1 View  10/29/2014   CLINICAL DATA:  59 year old male with a history of previous systolic heart failure. Left-sided chest pain. Shortness of breath.  EXAM: PORTABLE CHEST - 1 VIEW  COMPARISON:  CT 10/11/2014, plain film 10/11/2014, 09/28/2014  FINDINGS: Re- demonstration of cardiomegaly, with similar configuration of the cardiomediastinal silhouette.  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 Unchanged cardiac pacing device on left chest wall with single lead in place. The distal aspect of the lead  is not imaged on the plain film.  Atherosclerosis of the aortic arch.  Diffuse interstitial opacities with interlobular septal thickening. Right basilar opacity with blunting of the right costophrenic angle.  No pneumothorax  IMPRESSION: Evidence of congestive heart failure with pulmonary edema and small right pleural effusion. The effusion appears to be persisted from the comparison study from October 11, 2014.  Surgical changes of median sternotomy and  CABG.  Unchanged cardiac pacing device with single lead in place, as above.  Atherosclerosis.  Signed,  Yvone Neu. Loreta Ave, DO  [facility]  [facility]   Electronically Signed   By: Gilmer Mor D.O.   On: 10/29/2014 13:59    2D Echo: TTE 08/03/2014 - Left ventricle: The cavity size was normal. Left ventricular geometry showed evidence of eccentric hypertrophy. Systolic function was severely reduced. The estimated ejection fraction was in the range of 20% to 25%. Severe diffuse hypokinesis with no identifiable regional variations. Doppler parameters are consistent with elevated mean left atrial filling pressure. Acoustic contrast opacification revealed no evidence ofthrombus. - Left atrium: The atrium was severely dilated. - Right ventricle: The cavity size was mildly dilated. Systolic function was severely reduced. - Right atrium: The atrium was severely dilated. - Pericardium, extracardiac: There was a left pleural effusion.  Cardiac Cath: 12/02/2011 Left & Right Heart Catheterization w/ Coronary Angiogram 1. 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 Patient does have diffuse nonobstructive disease. 2. Moderate pulmonary hypertension with elevated left ventricular filling pressures.  Admission HPI: Mr. Johnothan Bascomb is a 59 y.o. male w/ PMHx of HTN, chronic sCHF w/ AICD (EF 20-25%), CAD, DM type II, chronic atrial fibrillation on Xarelto, HLD,  homelessness, and h/o non-compliance, presents to the ED w/ complaints of SOB, fatigue, and chest pain. Patient states he has been having worsening SOB for the past day or so, w/ accompanying chest pain. States these symptoms woke him up from his sleep this AM. He has had this feeling many times in the past. He claims the pain is left-sided, sharp in nature, non-radiating, and reproducible in nature. He has known h/o severe sCHF, states he has worsening SOB w/ exertion as well as worsening symptoms of PND and orthopnea. States he has to sleep in a recliner/chair when he sleeps and has done this for about 1 year. He denies palpitations, but does admit to some intermittent dizziness.   In ED, patient was noted to be in atrial fibrillation w/ RVR, rate in the 140-150's. Started on Diltiazem gtt and given lasix 40 mg IV once.   Hospital Course by problem list:   Atrial Fibrillation with rapid ventricular response: The patient was found to be tachycardic with a heart rate in the 140-150s upon admission with a known history of atrial fibrillation. He was placed on a diltiazem drip and then converted to oral diltiazem  q6h for rate control. His home spironolactone was continued at  daily. On 8/20 he was seen to have two episodes of nonsustained ventricular tachycardia, though he was asymptomatic and didn't have any more episodes after that.  On discharge the oral diltiazem was discontinued and his home dose of metoprolol tartrate was increased from  BID to  BID. Patient was noted to have a CHA2DS2-VASc score of 4, with a 4.0% yearly risk of stroke. He was continued on his home rivaroxaban dose of  daily throughout hospital stay.  Acute on Chronic systolic CHF: Patient was noted to be up to 224 pounds on admission, with a suspected dry weight of 216 pounds. His BNP was 807 on presentation (1232 on last presentation of CHF exacerbation).  He was given one dose of IV furosemide  in the ED, but  then scheduled for  IV BID upon admission. On 8/21 the patient was still volume overloaded on exam and the furosemide dosage was increased to  IV TID. His final weight on  admission was 212 pounds and all symptoms had resolved.    Chest Pain: He reported some chest pain on admission associated with some epigastric pain as well. EKG showed no new changes. Troponins were initially negative and then slightly elevated to 0.04 x2 on trending. This was attributed to the congestive heart failure exacerbation. His home ASA and atorvastatin were continued. Chest pain resolved by hospital day 2 and did not return.   Discharge Vitals:   BP 139/91 mmHg  Pulse 101  Temp(Src) 97.5 F (36.4 C) (Oral)  Resp 24  Ht 5\' 4"  (1.626 m)  Wt 212 lb 12.8 oz (96.525 kg)  BMI 36.51 kg/m2  SpO2 98%  Discharge Labs:  Results for orders placed or performed during the hospital encounter of 11/11/14 (from the past 24 hour(s))  Glucose, capillary     Status: Abnormal   Collection Time: 11/13/14  4:21 PM  Result Value Ref Range   Glucose-Capillary 276 (H) 65 - 99 mg/dL   Comment 1 Notify RN    Comment 2 Document in Chart   Glucose, capillary     Status: Abnormal   Collection Time: 11/13/14  8:35 PM  Result Value Ref Range   Glucose-Capillary 221 (H) 65 - 99 mg/dL  Glucose, capillary     Status: Abnormal   Collection Time: 11/14/14  7:08 AM  Result Value Ref Range   Glucose-Capillary 232 (H) 65 - 99 mg/dL  Basic metabolic panel     Status: Abnormal   Collection Time: 11/14/14 10:28 AM  Result Value Ref Range   Sodium 134 (L) 135 - 145 mmol/L   Potassium 4.7 3.5 - 5.1 mmol/L   Chloride 95 (L) 101 - 111 mmol/L   CO2 30 22 - 32 mmol/L   Glucose, Bld 231 (H) 65 - 99 mg/dL   BUN 30 (H) 6 - 20 mg/dL   Creatinine, Ser 4.19 0.61 - 1.24 mg/dL   Calcium 8.7 (L) 8.9 - 10.3 mg/dL   GFR calc non Af Amer >60 >60 mL/min   GFR calc Af Amer >60 >60 mL/min   Anion gap 9 5 - 15  Glucose, capillary     Status:  Abnormal   Collection Time: 11/14/14 11:25 AM  Result Value Ref Range   Glucose-Capillary 246 (H) 65 - 99 mg/dL   Comment 1 Notify RN    Comment 2 Document in Chart     Signed: Darreld Mclean, MD 11/14/2014, 2:13 PM    Services Ordered on Discharge: none Equipment Ordered on Discharge: none

## 2014-11-15 ENCOUNTER — Telehealth: Payer: Self-pay

## 2014-11-15 NOTE — Telephone Encounter (Signed)
Attempted to contact the patient to check on his status and discuss follow at the [facility]. Call placed to # [phone] and a voice mail message was left requesting a call back to # [phone] or [phone].

## 2014-11-16 ENCOUNTER — Telehealth: Payer: Self-pay

## 2014-11-16 NOTE — Telephone Encounter (Signed)
[facility] Post-discharge Follow-Up Phone Call:  Date of Discharge: 11/11/2014 Principal Discharge Diagnosis(es):n  Afib w/ RVR, chest pain, acute on chronc CHF Post-discharge Communication:  Call placed to the patient Call Completed: Yes                 With Whom: Patient Interpreter Needed: No                   Please check all that apply:  X Patient is knowledgeable of his/her condition(s) and/or treatment. X Patient is caring for self. - Homeless ? Patient is receiving assist at home from family and/or caregiver. Family and/or caregiver is knowledgeable of patient's condition(s) and/or treatment. ? Patient is receiving home health services. If so, name of agency.     Medication Reconciliation:  ? Medication list reviewed with patient. X Patient obtained all discharge medications.  He stated that he has all of his medications and will bring them to his appointment tomorrow to review with the MD.    Activities of Daily Living:  X Independent ? Needs assist (describe; ? home DME used) ? [facility] (describe, ? home DME used)   Community resources in place for patient:  X None  - He continues to refuses to go to a shelter.   He also continues to eat at restaurants or with a friend.  ? Home Health/Home DME ? Assisted Living ? Support Group          Patient Education:  Instructed him regarding the importance of eating 3 meals/day and monitoring his blood sugars/logging the results. He stated that he ate 2 meals yesterday and his blood sugar was 145.   He said that he is doing " good."  He reported no shortness of breath or chest pain and stated that his breathing is " not bad." .    He also stated that he is going to [facility] today to check his BP.  He said that he is meeting with someone today at 1400 about housing.  He said that the person called him; but he is not sure where she is from and he will bring any information that she gives him to his appointment at [facility]  tomorrow.    He reported no questions/concerns and confirmed that he would be at his [facility] appointment tomorrow - 11/17/14 @ 1200 w/ Dr Venetia Night.  He said that he has transportation to the appointment.

## 2014-11-17 ENCOUNTER — Ambulatory Visit: Payer: Medicaid Other | Admitting: Family Medicine

## 2014-11-17 ENCOUNTER — Telehealth: Payer: Self-pay

## 2014-11-17 NOTE — Telephone Encounter (Signed)
Call placed to patient to check on status as patient missed appointment on 11/17/14 at 1200 with Dr. Venetia Night.  Patient indicated he thought his appointment was on 11/25/14.  Informed patient he does have an appointment scheduled on 11/25/14 at 1445 with Dr. Venetia Night and an appointment on 11/23/14 at 0945 with Dr. Daleen Squibb.  Patient verbalized understanding and indicated he would need a reminder call the day before each appointment so he would remember his appointments.  Informed patient that appointment would Dr. Venetia Night could be changed to an earlier date; patient refused this and indicated he wanted to keep appointment on 11/25/14. Inquired about patient's medical status. Patient indicates he is feeling "fine." Patient denies being short of breath today and indicates he has slight swelling in lower extremities.  No new/additional needs identified at this time.

## 2014-11-22 ENCOUNTER — Telehealth: Payer: Self-pay

## 2014-11-22 NOTE — Telephone Encounter (Signed)
Attempted to contact the patient to check on his status and to confirm his appointment tomorrow - 08/23/14 @ 0945 w/ Dr Daleen Squibb.  Call placed to # [phone] (H) 4 times this afternoon  and the message states that the person is not available right now and to please  try the call again later.

## 2014-11-23 ENCOUNTER — Other Ambulatory Visit (HOSPITAL_COMMUNITY): Payer: Medicaid Other

## 2014-11-23 ENCOUNTER — Inpatient Hospital Stay (HOSPITAL_COMMUNITY)
Admission: EM | Admit: 2014-11-23 | Discharge: 2014-12-05 | DRG: 981 | Disposition: A | Discharge status: Home / Self Care | Payer: Medicaid Other | Attending: Internal Medicine | Admitting: Internal Medicine

## 2014-11-23 ENCOUNTER — Observation Stay (HOSPITAL_COMMUNITY): Payer: Medicaid Other

## 2014-11-23 ENCOUNTER — Emergency Department (HOSPITAL_COMMUNITY): Payer: Medicaid Other

## 2014-11-23 ENCOUNTER — Ambulatory Visit: Payer: Medicaid Other | Admitting: Cardiology

## 2014-11-23 ENCOUNTER — Encounter (HOSPITAL_COMMUNITY): Payer: Self-pay | Admitting: Emergency Medicine

## 2014-11-23 DIAGNOSIS — Z951 Presence of aortocoronary bypass graft: Secondary | ICD-10-CM

## 2014-11-23 DIAGNOSIS — Z9119 Patient's noncompliance with other medical treatment and regimen: Secondary | ICD-10-CM | POA: Diagnosis present

## 2014-11-23 DIAGNOSIS — D62 Acute posthemorrhagic anemia: Secondary | ICD-10-CM | POA: Insufficient documentation

## 2014-11-23 DIAGNOSIS — I5023 Acute on chronic systolic (congestive) heart failure: Secondary | ICD-10-CM

## 2014-11-23 DIAGNOSIS — Z5900 Homelessness unspecified: Secondary | ICD-10-CM

## 2014-11-23 DIAGNOSIS — B9681 Helicobacter pylori [H. pylori] as the cause of diseases classified elsewhere: Secondary | ICD-10-CM | POA: Diagnosis present

## 2014-11-23 DIAGNOSIS — Z91048 Other nonmedicinal substance allergy status: Secondary | ICD-10-CM

## 2014-11-23 DIAGNOSIS — I5043 Acute on chronic combined systolic (congestive) and diastolic (congestive) heart failure: Secondary | ICD-10-CM | POA: Diagnosis not present

## 2014-11-23 DIAGNOSIS — I255 Ischemic cardiomyopathy: Secondary | ICD-10-CM | POA: Diagnosis present

## 2014-11-23 DIAGNOSIS — I4891 Unspecified atrial fibrillation: Secondary | ICD-10-CM | POA: Insufficient documentation

## 2014-11-23 DIAGNOSIS — R0789 Other chest pain: Secondary | ICD-10-CM

## 2014-11-23 DIAGNOSIS — E780 Pure hypercholesterolemia: Secondary | ICD-10-CM | POA: Diagnosis present

## 2014-11-23 DIAGNOSIS — Z95810 Presence of automatic (implantable) cardiac defibrillator: Secondary | ICD-10-CM | POA: Diagnosis not present

## 2014-11-23 DIAGNOSIS — R6889 Other general symptoms and signs: Secondary | ICD-10-CM

## 2014-11-23 DIAGNOSIS — I10 Essential (primary) hypertension: Secondary | ICD-10-CM | POA: Diagnosis present

## 2014-11-23 DIAGNOSIS — I252 Old myocardial infarction: Secondary | ICD-10-CM

## 2014-11-23 DIAGNOSIS — I482 Chronic atrial fibrillation: Secondary | ICD-10-CM | POA: Diagnosis present

## 2014-11-23 DIAGNOSIS — E871 Hypo-osmolality and hyponatremia: Secondary | ICD-10-CM | POA: Diagnosis present

## 2014-11-23 DIAGNOSIS — K264 Chronic or unspecified duodenal ulcer with hemorrhage: Secondary | ICD-10-CM | POA: Diagnosis present

## 2014-11-23 DIAGNOSIS — D638 Anemia in other chronic diseases classified elsewhere: Secondary | ICD-10-CM | POA: Diagnosis present

## 2014-11-23 DIAGNOSIS — R578 Other shock: Secondary | ICD-10-CM | POA: Insufficient documentation

## 2014-11-23 DIAGNOSIS — M79669 Pain in unspecified lower leg: Secondary | ICD-10-CM

## 2014-11-23 DIAGNOSIS — K5900 Constipation, unspecified: Secondary | ICD-10-CM | POA: Diagnosis present

## 2014-11-23 DIAGNOSIS — R0603 Acute respiratory distress: Secondary | ICD-10-CM | POA: Insufficient documentation

## 2014-11-23 DIAGNOSIS — I509 Heart failure, unspecified: Secondary | ICD-10-CM

## 2014-11-23 DIAGNOSIS — E1151 Type 2 diabetes mellitus with diabetic peripheral angiopathy without gangrene: Secondary | ICD-10-CM | POA: Diagnosis present

## 2014-11-23 DIAGNOSIS — I4581 Long QT syndrome: Secondary | ICD-10-CM

## 2014-11-23 DIAGNOSIS — Z833 Family history of diabetes mellitus: Secondary | ICD-10-CM

## 2014-11-23 DIAGNOSIS — E785 Hyperlipidemia, unspecified: Secondary | ICD-10-CM | POA: Diagnosis present

## 2014-11-23 DIAGNOSIS — Z7982 Long term (current) use of aspirin: Secondary | ICD-10-CM

## 2014-11-23 DIAGNOSIS — R570 Cardiogenic shock: Secondary | ICD-10-CM | POA: Diagnosis not present

## 2014-11-23 DIAGNOSIS — N179 Acute kidney failure, unspecified: Secondary | ICD-10-CM | POA: Diagnosis present

## 2014-11-23 DIAGNOSIS — Z7901 Long term (current) use of anticoagulants: Secondary | ICD-10-CM | POA: Diagnosis not present

## 2014-11-23 DIAGNOSIS — E119 Type 2 diabetes mellitus without complications: Secondary | ICD-10-CM | POA: Diagnosis present

## 2014-11-23 DIAGNOSIS — Z79899 Other long term (current) drug therapy: Secondary | ICD-10-CM

## 2014-11-23 DIAGNOSIS — F17210 Nicotine dependence, cigarettes, uncomplicated: Secondary | ICD-10-CM | POA: Diagnosis present

## 2014-11-23 DIAGNOSIS — Z955 Presence of coronary angioplasty implant and graft: Secondary | ICD-10-CM

## 2014-11-23 DIAGNOSIS — J9601 Acute respiratory failure with hypoxia: Secondary | ICD-10-CM | POA: Diagnosis present

## 2014-11-23 DIAGNOSIS — K922 Gastrointestinal hemorrhage, unspecified: Secondary | ICD-10-CM

## 2014-11-23 DIAGNOSIS — D509 Iron deficiency anemia, unspecified: Secondary | ICD-10-CM | POA: Diagnosis present

## 2014-11-23 DIAGNOSIS — I2510 Atherosclerotic heart disease of native coronary artery without angina pectoris: Secondary | ICD-10-CM | POA: Diagnosis present

## 2014-11-23 DIAGNOSIS — D72829 Elevated white blood cell count, unspecified: Secondary | ICD-10-CM | POA: Diagnosis not present

## 2014-11-23 LAB — CBC WITH DIFFERENTIAL/PLATELET
BASOS ABS: 0.1 10*3/uL (ref 0.0–0.1)
Basophils Relative: 1 % (ref 0–1)
Eosinophils Absolute: 0.1 10*3/uL (ref 0.0–0.7)
Eosinophils Relative: 1 % (ref 0–5)
HEMATOCRIT: 35.7 % — AB (ref 39.0–52.0)
HEMOGLOBIN: 10 g/dL — AB (ref 13.0–17.0)
LYMPHS ABS: 1.4 10*3/uL (ref 0.7–4.0)
LYMPHS PCT: 13 % (ref 12–46)
MCH: 19.4 pg — ABNORMAL LOW (ref 26.0–34.0)
MCHC: 28 g/dL — ABNORMAL LOW (ref 30.0–36.0)
MCV: 69.3 fL — ABNORMAL LOW (ref 78.0–100.0)
MONOS PCT: 8 % (ref 3–12)
Monocytes Absolute: 0.8 10*3/uL (ref 0.1–1.0)
Neutro Abs: 8 10*3/uL — ABNORMAL HIGH (ref 1.7–7.7)
Neutrophils Relative %: 77 % (ref 43–77)
Platelets: 339 10*3/uL (ref 150–400)
RBC: 5.15 MIL/uL (ref 4.22–5.81)
RDW: 21.9 % — AB (ref 11.5–15.5)
WBC: 10.4 10*3/uL (ref 4.0–10.5)

## 2014-11-23 LAB — CARBOXYHEMOGLOBIN
Carboxyhemoglobin: 3.1 % — ABNORMAL HIGH (ref 0.5–1.5)
METHEMOGLOBIN: 1.4 % (ref 0.0–1.5)
O2 Saturation: 44.1 %
Total hemoglobin: 9.9 g/dL — ABNORMAL LOW (ref 13.5–18.0)

## 2014-11-23 LAB — GLUCOSE, CAPILLARY
GLUCOSE-CAPILLARY: 223 mg/dL — AB (ref 65–99)
GLUCOSE-CAPILLARY: 200 mg/dL — AB (ref 65–99)

## 2014-11-23 LAB — BASIC METABOLIC PANEL
ANION GAP: 8 (ref 5–15)
BUN: 26 mg/dL — ABNORMAL HIGH (ref 6–20)
CALCIUM: 8.6 mg/dL — AB (ref 8.9–10.3)
CO2: 25 mmol/L (ref 22–32)
Chloride: 97 mmol/L — ABNORMAL LOW (ref 101–111)
Creatinine, Ser: 1.13 mg/dL (ref 0.61–1.24)
GFR calc non Af Amer: >60 mL/min (ref >60)
Glucose, Bld: 276 mg/dL — ABNORMAL HIGH (ref 65–99)
Potassium: 4.6 mmol/L (ref 3.5–5.1)
Sodium: 130 mmol/L — ABNORMAL LOW (ref 135–145)

## 2014-11-23 LAB — TROPONIN I
Troponin I: 0.06 ng/mL — ABNORMAL HIGH (ref <0.031)
Troponin I: 0.05 ng/mL — ABNORMAL HIGH (ref <0.031)
Troponin I: 0.05 ng/mL — ABNORMAL HIGH (ref <0.031)

## 2014-11-23 LAB — BRAIN NATRIURETIC PEPTIDE: B NATRIURETIC PEPTIDE 5: 866.8 pg/mL — AB (ref 0.0–100.0)

## 2014-11-23 MED ORDER — METOPROLOL TARTRATE 100 MG PO TABS
100.0000 mg | ORAL_TABLET | Freq: Two times a day (BID) | ORAL | Status: DC
  Administered 2014-11-23: 100 mg via ORAL
  Filled 2014-11-23: qty 1

## 2014-11-23 MED ORDER — FUROSEMIDE 10 MG/ML IJ SOLN
80.0000 mg | Freq: Two times a day (BID) | INTRAMUSCULAR | Status: DC
  Administered 2014-11-23 – 2014-11-24 (×3): 80 mg via INTRAVENOUS
  Filled 2014-11-23 (×3): qty 8

## 2014-11-23 MED ORDER — ALBUTEROL SULFATE (2.5 MG/3ML) 0.083% IN NEBU: 2.5000 mg | INHALATION_SOLUTION | Freq: Four times a day (QID) | RESPIRATORY_TRACT | Status: DC | PRN

## 2014-11-23 MED ORDER — INSULIN ASPART 100 UNIT/ML ~~LOC~~ SOLN
0.0000 [IU] | Freq: Three times a day (TID) | SUBCUTANEOUS | Status: DC
  Administered 2014-11-23 – 2014-11-24 (×2): 7 [IU] via SUBCUTANEOUS
  Administered 2014-11-24 (×2): 4 [IU] via SUBCUTANEOUS
  Administered 2014-11-25: 7 [IU] via SUBCUTANEOUS
  Administered 2014-11-25 (×2): 11 [IU] via SUBCUTANEOUS
  Administered 2014-11-26 (×3): 7 [IU] via SUBCUTANEOUS

## 2014-11-23 MED ORDER — ALPRAZOLAM 0.25 MG PO TABS
0.2500 mg | ORAL_TABLET | Freq: Three times a day (TID) | ORAL | Status: DC | PRN
  Administered 2014-11-23 – 2014-11-27 (×4): 0.25 mg via ORAL
  Filled 2014-11-23 (×4): qty 1

## 2014-11-23 MED ORDER — OXYCODONE HCL 5 MG PO TABS
5.0000 mg | ORAL_TABLET | Freq: Four times a day (QID) | ORAL | Status: DC | PRN
  Administered 2014-11-23 – 2014-12-02 (×8): 5 mg via ORAL
  Filled 2014-11-23 (×8): qty 1

## 2014-11-23 MED ORDER — ONDANSETRON HCL 4 MG/2ML IJ SOLN
4.0000 mg | Freq: Four times a day (QID) | INTRAMUSCULAR | Status: DC | PRN
  Administered 2014-11-23: 4 mg via INTRAVENOUS
  Filled 2014-11-23: qty 2

## 2014-11-23 MED ORDER — NOREPINEPHRINE BITARTRATE 1 MG/ML IV SOLN
0.0000 ug/min | INTRAVENOUS | Status: DC
  Administered 2014-11-23: 20 ug/min via INTRAVENOUS
  Filled 2014-11-23: qty 4

## 2014-11-23 MED ORDER — NOREPINEPHRINE BITARTRATE 1 MG/ML IV SOLN
0.0000 ug/min | INTRAVENOUS | Status: DC
  Administered 2014-11-23: 10 ug/min via INTRAVENOUS
  Filled 2014-11-23: qty 16

## 2014-11-23 MED ORDER — FUROSEMIDE 10 MG/ML IJ SOLN
80.0000 mg | Freq: Once | INTRAMUSCULAR | Status: AC
  Administered 2014-11-23: 80 mg via INTRAVENOUS
  Filled 2014-11-23: qty 8

## 2014-11-23 MED ORDER — AMIODARONE HCL IN DEXTROSE 360-4.14 MG/200ML-% IV SOLN
60.0000 mg/h | INTRAVENOUS | Status: AC
  Administered 2014-11-23 (×2): 60 mg/h via INTRAVENOUS
  Filled 2014-11-23 (×2): qty 200

## 2014-11-23 MED ORDER — FUROSEMIDE 10 MG/ML IJ SOLN: 80.0000 mg | Freq: Two times a day (BID) | INTRAMUSCULAR | Status: DC

## 2014-11-23 MED ORDER — SODIUM CHLORIDE 0.9 % IJ SOLN
3.0000 mL | Freq: Two times a day (BID) | INTRAMUSCULAR | Status: DC
  Administered 2014-11-23 – 2014-12-01 (×18): 3 mL via INTRAVENOUS
  Administered 2014-12-02: 20 mL via INTRAVENOUS
  Administered 2014-12-05: 3 mL via INTRAVENOUS

## 2014-11-23 MED ORDER — DILTIAZEM HCL 25 MG/5ML IV SOLN
15.0000 mg | Freq: Once | INTRAVENOUS | Status: AC
  Administered 2014-11-23: 15 mg via INTRAVENOUS
  Filled 2014-11-23: qty 5

## 2014-11-23 MED ORDER — ONDANSETRON HCL 4 MG PO TABS: 4.0000 mg | ORAL_TABLET | Freq: Four times a day (QID) | ORAL | Status: DC | PRN

## 2014-11-23 MED ORDER — AMIODARONE LOAD VIA INFUSION
150.0000 mg | Freq: Once | INTRAVENOUS | Status: AC
  Administered 2014-11-23: 150 mg via INTRAVENOUS
  Filled 2014-11-23: qty 83.34

## 2014-11-23 MED ORDER — OXYCODONE-ACETAMINOPHEN 5-325 MG PO TABS
1.0000 | ORAL_TABLET | Freq: Once | ORAL | Status: AC
  Administered 2014-11-23: 1 via ORAL
  Filled 2014-11-23: qty 1

## 2014-11-23 MED ORDER — ATORVASTATIN CALCIUM 40 MG PO TABS
40.0000 mg | ORAL_TABLET | Freq: Every day | ORAL | Status: DC
  Administered 2014-11-23 – 2014-11-26 (×4): 40 mg via ORAL
  Filled 2014-11-23 (×4): qty 1

## 2014-11-23 MED ORDER — ASPIRIN EC 81 MG PO TBEC
81.0000 mg | DELAYED_RELEASE_TABLET | Freq: Every day | ORAL | Status: DC
  Administered 2014-11-23 – 2014-11-26 (×4): 81 mg via ORAL
  Filled 2014-11-23 (×6): qty 1

## 2014-11-23 MED ORDER — LIDOCAINE HCL (PF) 1 % IJ SOLN
INTRAMUSCULAR | Status: AC
  Filled 2014-11-23: qty 5

## 2014-11-23 MED ORDER — METOPROLOL TARTRATE 1 MG/ML IV SOLN: 5.0000 mg | Freq: Once | INTRAVENOUS | Status: DC | PRN

## 2014-11-23 MED ORDER — SPIRONOLACTONE 25 MG PO TABS
25.0000 mg | ORAL_TABLET | Freq: Every day | ORAL | Status: DC
  Administered 2014-11-23: 25 mg via ORAL
  Filled 2014-11-23: qty 1

## 2014-11-23 MED ORDER — ASPIRIN 81 MG PO CHEW
324.0000 mg | CHEWABLE_TABLET | Freq: Once | ORAL | Status: AC
  Administered 2014-11-23: 324 mg via ORAL
  Filled 2014-11-23: qty 4

## 2014-11-23 MED ORDER — RIVAROXABAN 20 MG PO TABS
20.0000 mg | ORAL_TABLET | Freq: Every day | ORAL | Status: DC
  Administered 2014-11-23 – 2014-11-26 (×4): 20 mg via ORAL
  Filled 2014-11-23 (×4): qty 1

## 2014-11-23 MED ORDER — METOPROLOL TARTRATE 1 MG/ML IV SOLN
5.0000 mg | Freq: Once | INTRAVENOUS | Status: AC
  Administered 2014-11-23: 5 mg via INTRAVENOUS
  Filled 2014-11-23: qty 5

## 2014-11-23 MED ORDER — AMIODARONE HCL IN DEXTROSE 360-4.14 MG/200ML-% IV SOLN
60.0000 mg/h | INTRAVENOUS | Status: DC
  Administered 2014-11-23: 30 mg/h via INTRAVENOUS
  Administered 2014-11-24: 60 mg/h via INTRAVENOUS
  Administered 2014-11-24: 30 mg/h via INTRAVENOUS
  Administered 2014-11-24 – 2014-11-26 (×6): 60 mg/h via INTRAVENOUS
  Filled 2014-11-23 (×10): qty 200

## 2014-11-23 NOTE — ED Notes (Signed)
Providers bedside

## 2014-11-23 NOTE — Consult Note (Signed)
Advanced Heart Failure Team Consult Note  Referring Physician: Dr Oswaldo Done Primary Physician: Dr [name] [facility]) Primary Cardiologist:  None  Reason for Consultation: A/C Systolic HF, frequent HF admissions  HPI:    Santhiago Collingsworth is a 59 y.o. [place] male with history of systolic HF EF 20-25% via Echo 08/03/14 s/p INCEPTA ICD implant 6/14, RV dysfunction, CAD s/p CABG x 2 w/ RF MAZE 7/14 at [facility], DM type II, Chronic afib on xarelto, and HLD.  He has frequent admissions for chest pain and acute CHF exacerbations in the setting of homelessness and medical noncompliance.    He was last admitted from 8/19-8/22/2016 . On admit he was in A fib RVR and placed on cardizem drip. He later converted to NSR. He also had volume overload and was diuresed with IV lasix and later transitioned to  Lasix 120 mg in am and 80 mg in pm. He was not discharged on cardizem but continued on lopressor 100 mg twice a day. Discharge weight of 214.  Prior to admit he was homeless and living on the street.Followed by [facility].  He presented to [facility] early this am with CP associated leg swelling and worsening SOB.  Prior to admit was taking his medications. EKG in the ED showed afib RVR at 139, converted to NSR after receiving 15 mg IV cardizem. Earlier today he was given 80 mg IV lasix.   He is acutely dyspneic. Sitting on the side of the bed. Mild chest discomfort. Limited diuresis with dose of IV lasix.   Pertinent admission labs include K 4.6, Cr 1.13, BNP of 866.8, Troponins negative, and CXR with persistent edema and R pleural effusion.  EKG in the ED showed afib RVR at 139, converted to NSR after receiving 15 mg IV cardizem.  Review of Systems: [y] = yes, [ ]  = no   General: Weight gain [y]; Weight loss [ ] ; Anorexia [ ] ; Fatigue [Y ]; Fever [ ] ; Chills [ ] ; Weakness [Y ]  Cardiac: Chest pain/pressure [y]; Resting SOB [y]; Exertional SOB [y]; Orthopnea [ y];  Pedal Edema [y]; Palpitations [ ] ; Syncope [ ] ; Presyncope [ ] ; Paroxysmal nocturnal dyspnea[ ]   Pulmonary: Cough [ ] ; Wheezing[ ] ; Hemoptysis[ ] ; Sputum [ ] ; Snoring [ ]   GI: Vomiting[ ] ; Dysphagia[ ] ; Melena[ ] ; Hematochezia [ ] ; Heartburn[ ] ; Abdominal pain [ ] ; Constipation [ ] ; Diarrhea [ ] ; BRBPR [ ]   GU: Hematuria[ ] ; Dysuria [ ] ; Nocturia[ ]   Vascular: Pain in legs with walking [ ] ; Pain in feet with lying flat [ ] ; Non-healing sores [ ] ; Stroke [ ] ; TIA [ ] ; Slurred speech [ ] ;  Neuro: Headaches[ ] ; Vertigo[ ] ; Seizures[ ] ; Paresthesias[ ] ;Blurred vision [ ] ; Diplopia [ ] ; Vision changes [ ]   Ortho/Skin: Arthritis [ ] ; Joint pain [ ] ; Muscle pain [ ] ; Joint swelling [ ] ; Back Pain [ ] ; Rash [ ]   Psych: Depression[ ] ; Anxiety[ ]   Heme: Bleeding problems [ ] ; Clotting disorders [ ] ; Anemia [ ]   Endocrine: Diabetes [ ] ; Thyroid dysfunction[ ]   Home Medications Prior to Admission medications   Medication Sig Start Date End Date Taking? Authorizing Provider  albuterol (PROVENTIL HFA;VENTOLIN HFA) 108 (90 BASE) MCG/ACT inhaler Inhale 2 puffs into the lungs every 6 (six) hours as needed for wheezing or shortness of breath. 09/30/14  Yes [name], MD  albuterol (PROVENTIL) (2.5 MG/3ML) 0.083% nebulizer solution Take 3 mLs (2.5 mg total) by nebulization every  6 (six) hours as needed for wheezing or shortness of breath. 09/06/14  Yes Jaclyn Shaggy, MD  aspirin EC 81 MG EC tablet Take 1 tablet (81 mg total) by mouth daily. 10/13/14  Yes Courtney Paris, MD  atorvastatin (LIPITOR) 40 MG tablet Take 1 tablet (40 mg total) by mouth daily at 6 PM. 10/13/14  Yes Courtney Paris, MD  docusate sodium (COLACE) 100 MG capsule Take 1 capsule (100 mg total) by mouth daily. 10/13/14  Yes Courtney Paris, MD  ferrous sulfate 325 (65 FE) MG tablet Take 1 tablet (325 mg total) by mouth 3 (three) times daily with meals. 10/13/14  Yes Courtney Paris, MD  furosemide (LASIX) 40 MG tablet 3 tabs ( ) in the morning and 2  tabs ( ) in the evening 10/04/14  Yes Jaclyn Shaggy, MD  glipiZIDE (GLUCOTROL) 10 MG tablet Take 1 tablet (10 mg total) by mouth 2 (two) times daily before a meal. 11/02/14  Yes Jaclyn Shaggy, MD  lisinopril (PRINIVIL,ZESTRIL) 2.5 MG tablet Take 1 tablet (2.5 mg total) by mouth daily. 11/02/14  Yes Jaclyn Shaggy, MD  metoprolol (LOPRESSOR) 100 MG tablet Take 1 tablet (100 mg total) by mouth 2 (two) times daily. 11/14/14  Yes Darreld Mclean, MD  rivaroxaban (XARELTO) 20 MG TABS tablet Take 1 tablet (20 mg total) by mouth daily before supper. 10/13/14  Yes Courtney Paris, MD  spironolactone (ALDACTONE) 25 MG tablet Take 1 tablet (25 mg total) by mouth daily. 10/19/14  Yes Gaylord Shih, MD  traMADol (ULTRAM) 50 MG tablet Take 1 tablet (50 mg total) by mouth every 8 (eight) hours as needed for moderate pain. 10/19/14  Yes Jaclyn Shaggy, MD    Past Medical History: Past Medical History  Diagnosis Date  . Hypertension   . Systolic heart failure     EF is 40-45% by echo, December 2013  . Noncompliance   . CAD (coronary artery disease) Sept 2013    s/p cardiac cath showing occlusion of small RCA with collaterals  . Chronic anticoagulation     on coumadin  . Atrial fibrillation   . Peripheral arterial disease   . Automatic implantable cardioverter-defibrillator in situ   . High cholesterol   . Myocardial infarction 2014  . Type II diabetes mellitus   . CHF (congestive heart failure)   . Shortness of breath dyspnea     Past Surgical History: Past Surgical History  Procedure Laterality Date  . Implantable cardioverter defibrillator implant      [place] in 07/2012; [employer]  . Coronary artery bypass graft  09/2012    2 vessels per patient [name]   . Coronary angioplasty with stent placement  11/2011    "1"  . Cardiac catheterization  09/2012  . Iliac artery stent Right 08/30/2013  . Left and right heart catheterization with coronary angiogram N/A 12/02/2011    Procedure: LEFT AND RIGHT HEART  CATHETERIZATION WITH CORONARY ANGIOGRAM;  Surgeon: Kathleene Hazel, MD;  Location: [facility] CATH LAB;  Service: Cardiovascular;  Laterality: N/A;  . Lower extremity angiogram N/A 08/30/2013    Procedure: LOWER EXTREMITY ANGIOGRAM;  Surgeon: Runell Gess, MD;  Location: [facility] CATH LAB;  Service: Cardiovascular;  Laterality: N/A;  . Lower extremity angiogram N/A 12/02/2013    Procedure: LOWER EXTREMITY ANGIOGRAM;  Surgeon: Runell Gess, MD;  Location: [facility] CATH LAB;  Service: Cardiovascular;  Laterality: N/A;    Family History: Family History  Problem Relation Age of Onset  . Diabetes  Mother     Social History: Social History   Social History  . Marital Status: Divorced    Spouse Name: N/A  . Number of Children: 3  . Years of Education: N/A   Occupational History  . Unemployed    Social History Main Topics  . Smoking status: Current Every Day Smoker -- 0.50 packs/day for 38 years    Types: Cigarettes  . Smokeless tobacco: Never Used  . Alcohol Use: No  . Drug Use: No  . Sexual Activity: Yes   Other Topics Concern  . None   Social History Narrative   Has an apartment with a roommate. He was living on the streets in 2013-01-21.  He reports that his father died in [place] in 2013/01/21.  He is divorced.  He is no longer estranged from his son, but still from his daughter who lives locally.  Neither of his parents, nor any siblings have any history of CAD.    Allergies:  Allergies  Allergen Reactions  . Tape Itching    Paper tape please.    Objective:    Vital Signs:   Temp:  [98 F (36.7 C)-98.3 F (36.8 C)] 98.3 F (36.8 C) (08/31 1000) Pulse Rate:  [61-133] 109 (08/31 1000) Resp:  [17-35] 22 (08/31 1000) BP: (97-122)/(59-92) 119/69 mmHg (08/31 1000) SpO2:  [94 %-99 %] 98 % (08/31 1000) Weight:  [218 lb 5 oz (99.026 kg)-232 lb (105.235 kg)] 223 lb (101.152 kg) (08/31 0723)    Weight change: Filed Weights   11/23/14 0326 11/23/14 0621 11/23/14 0723   Weight: 232 lb (105.235 kg) 218 lb 5 oz (99.026 kg) 223 lb (101.152 kg)    Intake/Output:   Intake/Output Summary (Last 24 hours) at 11/23/14 1107 Last data filed at 11/23/14 0935  Gross per 24 hour  Intake      0 ml  Output    500 ml  Net   -500 ml     Physical Exam: General:  Chronically ill and fatigued appearing, Dyspneic at rest. Sitting on the side of the bed.  HEENT: normal Neck: supple. JVP to jaw. Carotids 2+ bilat; no bruits. No lymphadenopathy or thryomegaly appreciated. Cor: PMI nondisplaced.  irregular. No rubs,  murmurs appreciated Lungs: Shallow . Rapid. Diminished throughout. On 2 liters.  Abdomen: obese, soft, nontender, + distended. No hepatosplenomegaly. No bruits or masses. +BS Extremities: no cyanosis, clubbing, rash. R and LLE 2-3+ t edema, extends to thighs.   Neuro: alert & orientedx3, cranial nerves grossly intact. moves all 4 extremities w/o difficulty. Affect flat Skin: cool to the touch  Telemetry:A fib 80s  Labs: Basic Metabolic Panel:  Recent Labs Lab 11/23/14 0328  NA 130*  K 4.6  CL 97*  CO2 25  GLUCOSE 276*  BUN 26*  CREATININE 1.13  CALCIUM 8.6*    Liver Function Tests: No results for input(s): AST, ALT, ALKPHOS, BILITOT, PROT, ALBUMIN in the last 168 hours. No results for input(s): LIPASE, AMYLASE in the last 168 hours. No results for input(s): AMMONIA in the last 168 hours.  CBC:  Recent Labs Lab 11/23/14 0328  WBC 10.4  NEUTROABS 8.0*  HGB 10.0*  HCT 35.7*  MCV 69.3*  PLT 339    Cardiac Enzymes:  Recent Labs Lab 11/23/14 0328  TROPONINI 0.06*    BNP: BNP (last 3 results)  Recent Labs  10/29/14 1540 11/11/14 1833 11/23/14 0328  BNP 1232.4* 807.3* 866.8*    ProBNP (last 3 results)  Recent Labs  09/06/14 1107 09/20/14 1201 10/04/14 1256  PROBNP 1577.00* 3006.00* 2524.00*     CBG: No results for input(s): GLUCAP in the last 168 hours.  Coagulation Studies: No results for input(s): LABPROT,  INR in the last 72 hours.  Other results: EKG: 11/23/14 Afib 84 bpm  Imaging: Dg Chest Portable 1 View  11/23/2014   CLINICAL DATA:  Palpitations and dyspnea  EXAM: PORTABLE CHEST - 1 VIEW  COMPARISON:  11/12/2014  FINDINGS: There is unchanged cardiomegaly. Ground-glass opacities persist in the central and basilar regions bilaterally. Right pleural effusion persists. Vascular and interstitial congestive changes are present, slightly less severe than on 11/12/2014.  IMPRESSION: Congestive heart failure with slightly less severity compared to 11/12/2014.   Electronically Signed   By: Ellery Plunk M.D.   On: 11/23/2014 04:10      Medications:     Current Medications: . aspirin EC  81 mg Oral Daily  . atorvastatin  40 mg Oral q1800  . furosemide  80 mg Intravenous BID  . metoprolol  100 mg Oral BID  . rivaroxaban  20 mg Oral QAC supper  . sodium chloride  3 mL Intravenous Q12H  . spironolactone  25 mg Oral Daily     Infusions:      Assessment/Plan   1. Acute on chronic systolic CHF, ICM EF 20-25% with poor RV function, NYHA class IV -> Cardiogenic shock 2. Acute hypoxic respiratory failure 3. Chronic Afib RVR -  4. 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 DM type II 6. Homelessness 7. Hypotension    Mr Maurice March is a 59 year old referred to HF team by Dr Oswaldo Done for heart failure management. He has a history of  ICM with known LV dysfunction dating back to 2013. Frequently admitted for CP and volume overload.   Today he is acutely dyspneic at rest with marked volume overload. Given 80 mg IV now on 3 east. Stat CXR with pulmonary edema and small R pleural effusion. Stop lopressor due to concern for low output.  Repeat ECHO  Transferred to ICU for central line placement to help guide therapies. On arrival to ICU he is hypotensive and back in A fib. He is agreeable to central line placement. Check CO-OX once line placed and may require short term inotropes. He will not be candidate for  home inotropes or advanced therapies.with current social issues--> homeless.   Load with amiodarone drip. No Cardizem with low EF. Continue Xarelto.   ICD interrogations - Multiple episodes of A fib RVR. On 8/27 received therapy for VF zone  > 220 bpm.    CLEGG,AMY NP-C   Length of Stay: 0  Advanced Heart Failure Team Pager [phone] (M-F; 7a - 4p)  Please contact [facility] for night-coverage after hours (4p -7a ) and weekends on [email or web address]  Patient seen and examined with Tonye Becket, NP. We discussed all aspects of the encounter. I agree with the assessment and plan as stated above.   Patient with cardiogenic shock in setting of low EF and AF. He is markedly volume overloaded. We have moved him to ICU and placed a central line. He remains hypotensive and will now start norepi. Start amio as needed for rate control. Will need diuresis and inotropic support.   ICD interrogated and shows frequent RVR. No VT. Need to clarify if AF is truly chronic, I suspect it is.   Advanced therapies will be limited due to social situation.   The patient is critically ill with multiple  organ systems failure and requires high complexity decision making for assessment and support, frequent evaluation and titration of therapies, application of advanced monitoring technologies and extensive interpretation of multiple databases.   Critical Care Time devoted to patient care services described in this note is 45 Minutes - not included time for central line.Gala Romney, Bo Rogue,MD 5:52 PM

## 2014-11-23 NOTE — ED Provider Notes (Signed)
CSN: [id number]     Arrival date & time 11/23/14  0316 History  This chart was scribed for Shon Baton, MD by Evon Slack, ED Scribe. This patient was seen in room A13C/A13C and the patient's care was started at 3:28 AM.     Chief Complaint  Patient presents with  . Chest Pain   The history is provided by the patient. No language interpreter was used.   HPI Comments: Jacob [name] is a 59 y.o. male who presents to the Emergency Department complaining of sharp CP onset 5 PM yesterday. Pt rates the severity of his pain 8/10. Pt states that he has a Hx of CAD. Pt states that this pain does not feel like previous MI. Pt states that he has associated leg swelling and slight SOB. Pt states that he has a HX of admission for CHF and the leg swelling feels the same if not worse. Pt denies nausea, vomiting, diaphoresis or other related symptoms.   Recent admission earlier in August for the same presentation. Was initially in A. fib with RVR and was volume overloaded. Was admitted at that point for diuresis. Reports compliance with medications but this is questionable.  Past Medical History  Diagnosis Date  . Hypertension   . Systolic heart failure     EF is 40-45% by echo, December 2013  . Noncompliance   . CAD (coronary artery disease) Sept 2013    s/p cardiac cath showing occlusion of small RCA with collaterals  . Chronic anticoagulation     on coumadin  . Atrial fibrillation   . Peripheral arterial disease   . Automatic implantable cardioverter-defibrillator in situ   . High cholesterol   . Myocardial infarction 2014  . Type II diabetes mellitus   . CHF (congestive heart failure)   . Shortness of breath dyspnea    Past Surgical History  Procedure Laterality Date  . Implantable cardioverter defibrillator implant      [place] in 07/2012; [employer]  . Coronary artery bypass graft  09/2012    2 vessels per patient [name]   . Coronary angioplasty with stent placement  11/2011     "1"  . Cardiac catheterization  09/2012  . Iliac artery stent Right 08/30/2013  . Left and right heart catheterization with coronary angiogram N/A 12/02/2011    Procedure: LEFT AND RIGHT HEART CATHETERIZATION WITH CORONARY ANGIOGRAM;  Surgeon: Kathleene Hazel, MD;  Location: [facility] CATH LAB;  Service: Cardiovascular;  Laterality: N/A;  . Lower extremity angiogram N/A 08/30/2013    Procedure: LOWER EXTREMITY ANGIOGRAM;  Surgeon: Runell Gess, MD;  Location: [facility] CATH LAB;  Service: Cardiovascular;  Laterality: N/A;  . Lower extremity angiogram N/A 12/02/2013    Procedure: LOWER EXTREMITY ANGIOGRAM;  Surgeon: Runell Gess, MD;  Location: [facility] CATH LAB;  Service: Cardiovascular;  Laterality: N/A;   Family History  Problem Relation Age of Onset  . Diabetes Mother    Social History  Substance Use Topics  . Smoking status: Current Every Day Smoker -- 0.50 packs/day for 38 years    Types: Cigarettes  . Smokeless tobacco: Never Used  . Alcohol Use: No    Review of Systems  Constitutional: Negative.  Negative for fever and diaphoresis.  Respiratory: Positive for shortness of breath. Negative for chest tightness.   Cardiovascular: Positive for chest pain and leg swelling.  Gastrointestinal: Negative.  Negative for nausea, vomiting and abdominal pain.  Genitourinary: Negative.  Negative for dysuria.  Neurological: Negative for headaches.  All other systems reviewed and are negative.    Allergies  Tape  Home Medications   Prior to Admission medications   Medication Sig Start Date End Date Taking? Authorizing Provider  albuterol (PROVENTIL HFA;VENTOLIN HFA) 108 (90 BASE) MCG/ACT inhaler Inhale 2 puffs into the lungs every 6 (six) hours as needed for wheezing or shortness of breath. 09/30/14   [name], MD  albuterol (PROVENTIL) (2.5 MG/3ML) 0.083% nebulizer solution Take 3 mLs (2.5 mg total) by nebulization every 6 (six) hours as needed for wheezing or shortness of breath.  09/06/14   Jaclyn Shaggy, MD  aspirin EC 81 MG EC tablet Take 1 tablet (81 mg total) by mouth daily. 10/13/14   Courtney Paris, MD  atorvastatin (LIPITOR) 40 MG tablet Take 1 tablet (40 mg total) by mouth daily at 6 PM. 10/13/14   Courtney Paris, MD  docusate sodium (COLACE) 100 MG capsule Take 1 capsule (100 mg total) by mouth daily. 10/13/14   Courtney Paris, MD  ferrous sulfate 325 (65 FE) MG tablet Take 1 tablet (325 mg total) by mouth 3 (three) times daily with meals. 10/13/14   Courtney Paris, MD  furosemide (LASIX) 40 MG tablet 3 tabs (120mg ) in the morning and 2 tabs (80mg ) in the evening 10/04/14   Jaclyn Shaggy, MD  glipiZIDE (GLUCOTROL) 10 MG tablet Take 1 tablet (10 mg total) by mouth 2 (two) times daily before a meal. 11/02/14   Jaclyn Shaggy, MD  lisinopril (PRINIVIL,ZESTRIL) 2.5 MG tablet Take 1 tablet (2.5 mg total) by mouth daily. 11/02/14   Jaclyn Shaggy, MD  metoprolol (LOPRESSOR) 100 MG tablet Take 1 tablet (100 mg total) by mouth 2 (two) times daily. 11/14/14   Darreld Mclean, MD  rivaroxaban (XARELTO) 20 MG TABS tablet Take 1 tablet (20 mg total) by mouth daily before supper. 10/13/14   Courtney Paris, MD  spironolactone (ALDACTONE) 25 MG tablet Take 1 tablet (25 mg total) by mouth daily. 10/19/14   Gaylord Shih, MD  traMADol (ULTRAM) 50 MG tablet Take 1 tablet (50 mg total) by mouth every 8 (eight) hours as needed for moderate pain. 10/19/14   Jaclyn Shaggy, MD   BP 100/70 mmHg  Pulse 103  Resp 25  Ht 5\' 4"  (1.626 m)  Wt 232 lb (105.235 kg)  BMI 39.80 kg/m2  SpO2 97%   Physical Exam  Constitutional: He is oriented to person, place, and time. No distress.  HENT:  Head: Normocephalic and atraumatic.  Eyes: Pupils are equal, round, and reactive to light.  Neck: Neck supple.  Cardiovascular: Normal heart sounds.   No murmur heard. Tachycardia, irregular rhythm  Pulmonary/Chest: Effort normal. No respiratory distress. He has no wheezes.  Fine crackles, mild tachypnea, midline sternotomy scar   Abdominal: Soft. Bowel sounds are normal. There is no tenderness. There is no rebound.  Musculoskeletal: He exhibits edema.  2+ pitting lower extremity edema  Neurological: He is alert and oriented to person, place, and time.  Skin: Skin is warm and dry.  Psychiatric: He has a normal mood and affect.  Nursing note and vitals reviewed.   ED Course  Procedures (including critical care time) DIAGNOSTIC STUDIES: Oxygen Saturation is 97% on RA, normal by my interpretation.    COORDINATION OF CARE: 3:44 AM-Discussed treatment plan with pt at bedside and pt agreed to plan.     Labs Review Labs Reviewed  TROPONIN I - Abnormal; Notable for the following:    Troponin I 0.06 (*)  All other components within normal limits  BRAIN NATRIURETIC PEPTIDE - Abnormal; Notable for the following:    B Natriuretic Peptide 866.8 (*)    All other components within normal limits  CBC WITH DIFFERENTIAL/PLATELET - Abnormal; Notable for the following:    Hemoglobin 10.0 (*)    HCT 35.7 (*)    MCV 69.3 (*)    MCH 19.4 (*)    MCHC 28.0 (*)    RDW 21.9 (*)    Neutro Abs 8.0 (*)    All other components within normal limits  BASIC METABOLIC PANEL - Abnormal; Notable for the following:    Sodium 130 (*)    Chloride 97 (*)    Glucose, Bld 276 (*)    BUN 26 (*)    Calcium 8.6 (*)    All other components within normal limits    Imaging Review Dg Chest Portable 1 View  11/23/2014   CLINICAL DATA:  Palpitations and dyspnea  EXAM: PORTABLE CHEST - 1 VIEW  COMPARISON:  11/12/2014  FINDINGS: There is unchanged cardiomegaly. Ground-glass opacities persist in the central and basilar regions bilaterally. Right pleural effusion persists. Vascular and interstitial congestive changes are present, slightly less severe than on 11/12/2014.  IMPRESSION: Congestive heart failure with slightly less severity compared to 11/12/2014.   Electronically Signed   By: Ellery Plunk M.D.   On: 11/23/2014 04:10   I have  personally reviewed and evaluated these images and lab results as part of my medical decision-making.   EKG Interpretation   Date/Time:  Wednesday November 23 2014 03:21:17 EDT Ventricular Rate:  139 PR Interval:    QRS Duration: 97 QT Interval:  337 QTC Calculation: 512 R Axis:   -65 Text Interpretation:  Atrial fibrillation Ventricular premature complex  Left anterior fascicular block Borderline repolarization abnormality  Prolonged QT interval Baseline wander in lead(s) V6 Confirmed by HORTON   MD, Toni Amend ([id number]) on 11/23/2014 3:27:10 AM      MDM   Final diagnoses:  Other chest pain  Acute on chronic systolic congestive heart failure  Atrial fibrillation, unspecified   Patient presents for chest pain, volume overload, and found to be in atrial fibrillation with RVR. Rates in the 130s. Similar presentation just 2 weeks ago. He required admission at that time for diuresis. Patient was given IV diltiazem with improvement of his rate to the 100s. EKG shows no evidence of acute ischemia and is unchanged from prior. Lab work is notable for mild elevation in troponin to 0.06. BNP is 866 which is comparable to last admission. Patient does have signs of volume overload. He was given IV Lasix. Patient reports improvement of chest pain after rate control. At this time he is not requiring a drip. Chest x-ray shows evidence of heart failure slightly less severe than 8/20.    Given patient's clinical picture of acute on chronic heart failure, atrial fibrillation, and volume overload, will admit to the medicine teaching service for diuresis and further management.  I personally performed the services described in this documentation, which was scribed in my presence. The recorded information has been reviewed and is accurate.      Shon Baton, MD 11/23/14 [phone]

## 2014-11-23 NOTE — Progress Notes (Signed)
Pt consented for CVC placement, Dr Clarise Cruz bedside educating pt on procedure. Pt verbalizes understanding as well as signed appropriate paperwork.

## 2014-11-23 NOTE — H&P (Signed)
Date: 11/23/2014               Patient Name:  Jacob [name] MRN: [id number]  DOB: January 18, 1956 Age / Sex: 59 y.o., male   PCP: [name], MD         Medical Service: Internal Medicine Teaching Service         Attending Physician: Dr. Tyson Alias, MD    First Contact: Dr. Darreld Mclean Pager: [phone]  Second Contact: Dr. Carlynn Purl Pager: [phone]       After Hours (After 5p/  First Contact Pager: [phone]  weekends / holidays): Second Contact Pager: [phone]   Chief Complaint: Chest pain  History of Present Illness: Mr. Jacob [name] is a 59 y.o. male with past medical history of hypertension, chronic HFrEF with AICD (EF 20-25%), coronary artery disease, diabetes mellitus type II, chronic atrial fibrillation on Xarelto, hyperlipidemia, and history of non-compliance who presents to the emergency department with sharp, left sided chest pain.  States his chest pain began around 5pm last night and described as sharp, non-radiating, and reproducible on palpation.  Associated with dizziness, shortness of breath.  Denies having chest pain on exertion.  Reports symptoms of orthopnea, PND.  States that since his last discharge he has been compliant with his medications stating he takes 120mg  Lasix in the morning and 80mg  in the evening. However, states he has been taking 75mg  Metoprolol bid when should be taking 100mg  bid.   In the ED, patient was noted to be in atrial fibrillation with RVR, rate of 139 on EKG.  During our exam his rate was improved to the low 100s-110s.  He received diltiazem 15mg  IV and Lasix 80mg  IV in the emergency department.  Initial troponin was elevated at 0.06 and BNP elevated 866 (Last admission was elevated to 807).  Portable 1-view chest xray was read as CHF with less severity compared to 11/12/14 imaging.  Meds: No current facility-administered medications for this encounter.   Current Outpatient Prescriptions  Medication Sig Dispense Refill  . albuterol  (PROVENTIL HFA;VENTOLIN HFA) 108 (90 BASE) MCG/ACT inhaler Inhale 2 puffs into the lungs every 6 (six) hours as needed for wheezing or shortness of breath. 1 Inhaler 0  . albuterol (PROVENTIL) (2.5 MG/3ML) 0.083% nebulizer solution Take 3 mLs (2.5 mg total) by nebulization every 6 (six) hours as needed for wheezing or shortness of breath. 150 mL 1  . aspirin EC 81 MG EC tablet Take 1 tablet (81 mg total) by mouth daily. 30 tablet 5  . atorvastatin (LIPITOR) 40 MG tablet Take 1 tablet (40 mg total) by mouth daily at 6 PM. 30 tablet 5  . docusate sodium (COLACE) 100 MG capsule Take 1 capsule (100 mg total) by mouth daily. 30 capsule 5  . ferrous sulfate 325 (65 FE) MG tablet Take 1 tablet (325 mg total) by mouth 3 (three) times daily with meals. 90 tablet 5  . furosemide (LASIX) 40 MG tablet 3 tabs (120mg ) in the morning and 2 tabs (80mg ) in the evening 150 tablet 1  . glipiZIDE (GLUCOTROL) 10 MG tablet Take 1 tablet (10 mg total) by mouth 2 (two) times daily before a meal. 60 tablet 2  . lisinopril (PRINIVIL,ZESTRIL) 2.5 MG tablet Take 1 tablet (2.5 mg total) by mouth daily. 30 tablet 2  . metoprolol (LOPRESSOR) 100 MG tablet Take 1 tablet (100 mg total) by mouth 2 (two) times daily. 60 tablet 0  . rivaroxaban (XARELTO) 20 MG TABS tablet Take  1 tablet (20 mg total) by mouth daily before supper. 30 tablet 6  . spironolactone (ALDACTONE) 25 MG tablet Take 1 tablet (25 mg total) by mouth daily. 30 tablet 6  . traMADol (ULTRAM) 50 MG tablet Take 1 tablet (50 mg total) by mouth every 8 (eight) hours as needed for moderate pain. 30 tablet 0    Allergies: Allergies as of 11/23/2014 - Review Complete 11/23/2014  Allergen Reaction Noted  . Tape Itching 11/11/2014   Past Medical History  Diagnosis Date  . Hypertension   . Systolic heart failure     EF is 40-45% by echo, December 2013  . Noncompliance   . CAD (coronary artery disease) Sept 2013    s/p cardiac cath showing occlusion of small RCA with  collaterals  . Chronic anticoagulation     on coumadin  . Atrial fibrillation   . Peripheral arterial disease   . Automatic implantable cardioverter-defibrillator in situ   . High cholesterol   . Myocardial infarction 2014  . Type II diabetes mellitus   . CHF (congestive heart failure)   . Shortness of breath dyspnea    Past Surgical History  Procedure Laterality Date  . Implantable cardioverter defibrillator implant      [place] in 07/2012; [employer]  . Coronary artery bypass graft  09/2012    2 vessels per patient [name]   . Coronary angioplasty with stent placement  11/2011    "1"  . Cardiac catheterization  09/2012  . Iliac artery stent Right 08/30/2013  . Left and right heart catheterization with coronary angiogram N/A 12/02/2011    Procedure: LEFT AND RIGHT HEART CATHETERIZATION WITH CORONARY ANGIOGRAM;  Surgeon: Kathleene Hazel, MD;  Location: [facility] CATH LAB;  Service: Cardiovascular;  Laterality: N/A;  . Lower extremity angiogram N/A 08/30/2013    Procedure: LOWER EXTREMITY ANGIOGRAM;  Surgeon: Runell Gess, MD;  [facility] C[facility] LAB;  Service: Cardiovascular;  Laterality: N/A;  . Lower extremity angiogram N/A 12/02/2013    Procedure: LOWER EXTREMITY ANGIOGRAM;  Surgeon: Runell Gess, MD;  Location: [facility] CATH LAB;  Service: Cardiovascular;  Laterality: N/A;   Family History  Problem Relation Age of Onset  . Diabetes Mother    Social History   Social History  . Marital Status: Divorced    Spouse Name: N/A  . Number of Children: 3  . Years of Education: N/A   Occupational History  . Unemployed    Social History Main Topics  . Smoking status: Current Every Day Smoker -- 0.50 packs/day for 38 years    Types: Cigarettes  . Smokeless tobacco: Never Used  . Alcohol Use: No  . Drug Use: No  . Sexual Activity: Yes   Other Topics Concern  . Not on file   Social History Narrative   Has an apartment with a roommate. He was living on the streets in 01-08-13.  He reports that his father died in [place] in 01/08/13.  He is divorced.  He is no longer estranged from his son, but still from his daughter who lives locally.  Neither of his parents, nor any siblings have any history of CAD.    Review of Systems: Review of Systems  Constitutional: Negative for fever and chills.  Respiratory: Positive for shortness of breath. Negative for cough and sputum production.   Cardiovascular: Positive for chest pain, orthopnea, leg swelling and PND. Negative for palpitations.  Gastrointestinal: Negative for nausea, vomiting, abdominal pain, diarrhea and constipation.  Genitourinary: Negative for dysuria and frequency.  Musculoskeletal: Negative for back pain, joint pain and falls.  Neurological: Positive for dizziness. Negative for sensory change, speech change and loss of consciousness.    Physical Exam: Blood pressure 100/70, pulse 103, resp. rate 25, height 5\' 4"  (1.626 m), weight 232 lb (105.235 kg), SpO2 97 %.  Physical Exam  Constitutional: He is oriented to person, place, and time.  Well developed, well nourished, lying in bed, in mild distress  HENT:  Head: Normocephalic and atraumatic.  Eyes: EOM are normal.  Neck: Normal range of motion. No JVD present. No tracheal deviation present.  Cardiovascular: Intact distal pulses.   Tachycardic rate, irregularly irregular rhythm  Pulmonary/Chest: Effort normal. No respiratory distress. He has wheezes. He exhibits tenderness.  Abdominal: Soft. Bowel sounds are normal. There is no tenderness.  Musculoskeletal: He exhibits edema.  Mild bilateral lower extremity trace pitting edema  Neurological: He is alert and oriented to person, place, and time. No cranial nerve deficit.  Skin: Skin is warm and dry.    Lab results: Basic Metabolic Panel:  Recent Labs  03/75/43 0328  NA 130*  K 4.6  CL 97*  CO2 25  GLUCOSE 276*  BUN 26*  CREATININE 1.13  CALCIUM 8.6*   CBC:  Recent Labs   11/23/14 0328  WBC 10.4  NEUTROABS 8.0*  HGB 10.0*  HCT 35.7*  MCV 69.3*  PLT 339   Cardiac Enzymes:  Recent Labs  11/23/14 0328  TROPONINI 0.06*   Urine Drug Screen: Drugs of Abuse     Component Value Date/Time   LABOPIA POSITIVE* 11/12/2014 0135   COCAINSCRNUR NONE DETECTED 11/12/2014 0135   LABBENZ NONE DETECTED 11/12/2014 0135   AMPHETMU NONE DETECTED 11/12/2014 0135   THCU NONE DETECTED 11/12/2014 0135   LABBARB NONE DETECTED 11/12/2014 0135     Imaging results:  Dg Chest Portable 1 View  11/23/2014   CLINICAL DATA:  Palpitations and dyspnea  EXAM: PORTABLE CHEST - 1 VIEW  COMPARISON:  11/12/2014  FINDINGS: There is unchanged cardiomegaly. Ground-glass opacities persist in the central and basilar regions bilaterally. Right pleural effusion persists. Vascular and interstitial congestive changes are present, slightly less severe than on 11/12/2014.  IMPRESSION: Congestive heart failure with slightly less severity compared to 11/12/2014.   Electronically Signed   By: Ellery Plunk M.D.   On: 11/23/2014 04:10    Other results: EKG: Atrial fibrillation with rate 139. Prolonged QT interval   Assessment & Plan by Problem: Active Problems:   Acute on chronic heart failure  59 y.o. male with past medical history of hypertension, chronic HFrEF with AICD (EF 20-25%), coronary artery disease, diabetes mellitus type II, chronic atrial fibrillation on Xarelto, hyperlipidemia, and history of non-compliance who presents to the emergency department with sharp, left sided chest pain.  Chest pain: suspect acute on chronic HFrEF with similar presentation to admission two weeks ago.  Reports compliance to Lasix since discharge.  Appears he has only been taking 75mg  metoprolol bid instead of the 100mg  bid he has been prescribed.  BNP elevated to 866 similar to last admission.  Initial troponin elevated at 0.06.  Chest xray suggestive of CHF although noted to have less severity when  compared to 11/12/14.  Given 80mg  IV lasix in the ED -cycle troponins -lasix 80mg  IV bid -continue spironolactone 25mg  daily -continue metoprolol 100mg  bid -continue aspirin 81mg  -hold lisinopril for now given blood pressure being a little soft.  Likely can resume later today or tomorrow -  oxycodone 5mg  po q6h prn -zofran 4mg  q6h prn -daily weights -I/O's -orthostatic vitals -consider cardiology consult for further ischemic work up given multiple admissions for similar episodes  Atrial fibrillation with RVR: rate in the 130s on admission.  Patient with history of chronic atrial fibrillation on Xarelto.  Supposed to be on metoprolol 100mg  bid for rate control at home.  Apppears that he only is taking 75mg  bid.  Rate was low 100s when seen at bedside after getting diltiazem 15mg  IV.  Will also give 5mg  IV metoprolol in the ED for improved rate control. -admit to telemetry -metoprolol as above -continue Xarelto 20mg  daily -repeat EKG  DM type II: Most recent HbA1c 8.1 on 11/12/14. Takes Glipizide 10 mg bid at home. ACEI 2.5 mg daily added during previous clinic visit for renal protection.  -ISS -Repeat HbA1c per clinic notes -Restart ACEI as above as BP tolerates  CAD: S/p CABG in 2014. Does admit to some chest pain on admission, states he woke up with this. Claims it is sharp in nature, tender to palpation. Seems quite atypical, although, given his significant history, cardiac workup reasonable. EKG w/ no new significant ischemic findings, troponin x1 negative.  -Continue to cycle troponins -Repeat EKG in AM as above -Continue ASA, statin -Morphine 2 mg q4h prn for pain -CCB/BB as above  Homelessness: Has refused shelter in the past as he states the shelters harbor significant alcohol and drug abuse. Follows w/ [facility] and does in fact follow up here and obtains his medications.  -social work consult  Diet: Carb modified  DVT/PE PPx: Xarelto  Code:  Full   Dispo: Disposition is deferred at this time, awaiting improvement of current medical problems. Anticipated discharge in approximately 1-2 day(s).   The patient does have a current PCP ([name], MD) and does need an [facility] hospital follow-up appointment after discharge.  The patient does not have transportation limitations that hinder transportation to clinic appointments.  Signed: [name], DO 11/23/2014, 5:44 AM

## 2014-11-23 NOTE — Progress Notes (Signed)
CSW received 2 orders that patient is homeless. He is extremely well known to this CSW from multiple hospitalizations. Patient is currently homeless and has been so for well over 2 years.  He has a wife and son but states that he was "kicked out" after he had his heart attack and could no longer work. Acute care social workers have met with patient during multiple hospitalizations and have offered patient shelter option but he adamantly refuses.  This CSW arranged for him to participate in the [facility] but eventually was closed from this program.  Patient has also been referred to [facility]- a coalition that assists with assisting chronically homeless individuals with housing options.  They are currently working with patient and have met with him 2 times.  He has not secured an apartment at this time. CSW will coordinate with patient and staff with the Homeless coalition to determine future status and needs of patient.  He adamantly has refused shelter or any type of assisted living; normally not meet medical criteria for placement. Patient has been moved to [facility]. Aware of patient and will follow up with full SW psychosocial assessment.  Lorie Phenix. Pauline Good, [place]

## 2014-11-23 NOTE — ED Notes (Signed)
Pt drove himself to the ED after experiencing chest pains.  Started Tuesday around 5pm.  Pt has hx of open heart surgery and does have a pacemaker.

## 2014-11-23 NOTE — Procedures (Signed)
Central Venous Catheter Insertion Procedure Note Harvir Castleberry [id number] [birth year]  Procedure: Insertion of Central Venous Catheter Indications: Assessment of intravascular volume  Procedure Details Consent: Risks of procedure as well as the alternatives and risks of each were explained to the (patient/caregiver).  Consent for procedure obtained. Time Out: Verified patient identification, verified procedure, site/side was marked, verified correct patient position, special equipment/implants available, medications/allergies/relevent history reviewed, required imaging and test results available.  Performed  Maximum sterile technique was used including antiseptics, cap, gloves, gown, hand hygiene, mask and sheet. Skin prep: Chlorhexidine; local anesthetic administered A antimicrobial bonded/coated triple lumen catheter was placed in the right internal jugular vein using the Seldinger technique.  Evaluation Blood flow good Complications: No apparent complications Patient did tolerate procedure well. Chest X-ray ordered to verify placement.  CXR: pending.  Arvilla Meres MD 11/23/2014, 5:48 PM

## 2014-11-23 NOTE — Care Management (Signed)
Transitional Care Clinic:  Patient followed at [facility]. Met with Olga Coaster, RN CM to inform that patient being closely followed at [facility]. She verbalized understanding and indicated patient may discharge later in the week. Met with patient at bedside. Patient indicates he would like to continue to receive follow-up and medical management at the [facility]. Appointment rescheduled for 11/29/14 at 84 with Dr. Jarold Song since patient currently hospitalized. Patient informed he will receive a discharge follow-up phone call from [place] after discharge.  Appointment placed on AVS. Olga Coaster, RN CM updated.

## 2014-11-23 NOTE — Progress Notes (Signed)
   Subjective: Patient states he is feeling somewhat better with some continued chest pain and lower leg swelling. Objective: Vital signs in last 24 hours: Filed Vitals:   11/23/14 1125 11/23/14 1346 11/23/14 1433 11/23/14 1445  BP: 133/54 112/69 111/67 97/74  Pulse: 101 90 93 79  Temp:  97.8 F (36.6 C) 97.8 F (36.6 C)   TempSrc:  Oral Oral   Resp:  22 39 35  Height:      Weight:      SpO2:  99% 100% 99%   Weight change:   Intake/Output Summary (Last 24 hours) at 11/23/14 1601 Last data filed at 11/23/14 1400  Gross per 24 hour  Intake      0 ml  Output    600 ml  Net   -600 ml   General: sitting in bed, eating Cardiac: tachy irregularly irregular rhythm Pulm: normal effort, faint bibasilar crackles, no respiratory distress Abd: soft, nontender, nondistended, BS present Ext: warm and well perfused, +2 pedal edema   Assessment/Plan: Active Problems:   Acute on chronic heart failure   Atrial fibrillation, unspecified   Acute on chronic combined systolic and diastolic CHF (congestive heart failure)  Acute on chronic systolic CHF: Patient recently discharged with similar symptoms 2 weeks ago. Reports compliance to his lasix but taking only 75 mg metoprolol BID rather than the 100 mg BID he was prescribed. His weight on discharge was 212, but weights today have been inconsistent from 218 to 232. He has elevated BNP of 866 and troponins slightly elevated at 0.06 and 0.05. His symptoms are likely due to volume overload which is difficult to control as patient's social and home status is a major barrier to his medical management and adherence. -Diurese w/ IV Lasix 80 mg BID -Increase Metoprolol tartrate to 100 mg BID, consider Metoprolol succinate in the future -Consider increasing Spirinolactone from 25 mg to 50 mg and monitor potassium -Continue aspirin, atorvastatin 40 mg  -Heart Failure team consulted and following for which we are appreciative -->HF team to transfer pt to  cardiac icu, hold BB, repeat ECHO, insert PICC and check coax, considering inotropic support  Afib w/ RVR: Patient admitted with Afib RVR rate of 139 which converted with one dose IV diltiazem.  -Continue Xarelto 20 mg po daily -Metoprolol tartrate 100 mg BID  Dispo: Disposition is deferred at this time, awaiting improvement of current medical problems.  Social Workers are following and working closely to determine avenues to resolve patient's home living situation and are in touch with CSW from the [employer]. He is referred to [facility] to try to establish a housing option.  The patient does have a current PCP ([name], MD) and does not need an [facility] hospital follow-up appointment after discharge.  The patient does not have transportation limitations that hinder transportation to clinic appointments.   LOS: 0 days   Darreld Mclean, MD 11/23/2014, 4:01 PM

## 2014-11-24 ENCOUNTER — Observation Stay (HOSPITAL_COMMUNITY): Payer: Medicaid Other

## 2014-11-24 DIAGNOSIS — D638 Anemia in other chronic diseases classified elsewhere: Secondary | ICD-10-CM | POA: Diagnosis present

## 2014-11-24 DIAGNOSIS — R570 Cardiogenic shock: Secondary | ICD-10-CM | POA: Diagnosis present

## 2014-11-24 DIAGNOSIS — K5900 Constipation, unspecified: Secondary | ICD-10-CM | POA: Diagnosis present

## 2014-11-24 DIAGNOSIS — Z7982 Long term (current) use of aspirin: Secondary | ICD-10-CM | POA: Diagnosis not present

## 2014-11-24 DIAGNOSIS — E1151 Type 2 diabetes mellitus with diabetic peripheral angiopathy without gangrene: Secondary | ICD-10-CM | POA: Diagnosis present

## 2014-11-24 DIAGNOSIS — R6889 Other general symptoms and signs: Secondary | ICD-10-CM | POA: Diagnosis not present

## 2014-11-24 DIAGNOSIS — K260 Acute duodenal ulcer with hemorrhage: Secondary | ICD-10-CM | POA: Diagnosis not present

## 2014-11-24 DIAGNOSIS — Z9119 Patient's noncompliance with other medical treatment and regimen: Secondary | ICD-10-CM | POA: Diagnosis present

## 2014-11-24 DIAGNOSIS — R0789 Other chest pain: Secondary | ICD-10-CM | POA: Diagnosis present

## 2014-11-24 DIAGNOSIS — Z590 Homelessness: Secondary | ICD-10-CM | POA: Diagnosis not present

## 2014-11-24 DIAGNOSIS — I509 Heart failure, unspecified: Secondary | ICD-10-CM | POA: Diagnosis not present

## 2014-11-24 DIAGNOSIS — I482 Chronic atrial fibrillation: Secondary | ICD-10-CM | POA: Diagnosis present

## 2014-11-24 DIAGNOSIS — Z955 Presence of coronary angioplasty implant and graft: Secondary | ICD-10-CM | POA: Diagnosis not present

## 2014-11-24 DIAGNOSIS — Z833 Family history of diabetes mellitus: Secondary | ICD-10-CM | POA: Diagnosis not present

## 2014-11-24 DIAGNOSIS — Z95810 Presence of automatic (implantable) cardiac defibrillator: Secondary | ICD-10-CM | POA: Diagnosis not present

## 2014-11-24 DIAGNOSIS — Z7901 Long term (current) use of anticoagulants: Secondary | ICD-10-CM | POA: Diagnosis not present

## 2014-11-24 DIAGNOSIS — B9681 Helicobacter pylori [H. pylori] as the cause of diseases classified elsewhere: Secondary | ICD-10-CM | POA: Diagnosis present

## 2014-11-24 DIAGNOSIS — I4891 Unspecified atrial fibrillation: Secondary | ICD-10-CM | POA: Diagnosis not present

## 2014-11-24 DIAGNOSIS — K264 Chronic or unspecified duodenal ulcer with hemorrhage: Secondary | ICD-10-CM | POA: Diagnosis present

## 2014-11-24 DIAGNOSIS — Z9889 Other specified postprocedural states: Secondary | ICD-10-CM | POA: Diagnosis not present

## 2014-11-24 DIAGNOSIS — Z79899 Other long term (current) drug therapy: Secondary | ICD-10-CM | POA: Diagnosis not present

## 2014-11-24 DIAGNOSIS — N179 Acute kidney failure, unspecified: Secondary | ICD-10-CM | POA: Diagnosis present

## 2014-11-24 DIAGNOSIS — I2510 Atherosclerotic heart disease of native coronary artery without angina pectoris: Secondary | ICD-10-CM | POA: Diagnosis present

## 2014-11-24 DIAGNOSIS — D72829 Elevated white blood cell count, unspecified: Secondary | ICD-10-CM | POA: Diagnosis not present

## 2014-11-24 DIAGNOSIS — K922 Gastrointestinal hemorrhage, unspecified: Secondary | ICD-10-CM | POA: Diagnosis not present

## 2014-11-24 DIAGNOSIS — E785 Hyperlipidemia, unspecified: Secondary | ICD-10-CM | POA: Diagnosis present

## 2014-11-24 DIAGNOSIS — E871 Hypo-osmolality and hyponatremia: Secondary | ICD-10-CM | POA: Diagnosis present

## 2014-11-24 DIAGNOSIS — E780 Pure hypercholesterolemia: Secondary | ICD-10-CM | POA: Diagnosis present

## 2014-11-24 DIAGNOSIS — I5023 Acute on chronic systolic (congestive) heart failure: Secondary | ICD-10-CM | POA: Insufficient documentation

## 2014-11-24 DIAGNOSIS — F17210 Nicotine dependence, cigarettes, uncomplicated: Secondary | ICD-10-CM | POA: Diagnosis present

## 2014-11-24 DIAGNOSIS — D509 Iron deficiency anemia, unspecified: Secondary | ICD-10-CM | POA: Diagnosis present

## 2014-11-24 DIAGNOSIS — I10 Essential (primary) hypertension: Secondary | ICD-10-CM | POA: Diagnosis not present

## 2014-11-24 DIAGNOSIS — I255 Ischemic cardiomyopathy: Secondary | ICD-10-CM | POA: Diagnosis present

## 2014-11-24 DIAGNOSIS — J9601 Acute respiratory failure with hypoxia: Secondary | ICD-10-CM | POA: Diagnosis present

## 2014-11-24 DIAGNOSIS — Z91048 Other nonmedicinal substance allergy status: Secondary | ICD-10-CM | POA: Diagnosis not present

## 2014-11-24 DIAGNOSIS — D62 Acute posthemorrhagic anemia: Secondary | ICD-10-CM | POA: Diagnosis not present

## 2014-11-24 DIAGNOSIS — I252 Old myocardial infarction: Secondary | ICD-10-CM | POA: Diagnosis not present

## 2014-11-24 DIAGNOSIS — I5043 Acute on chronic combined systolic (congestive) and diastolic (congestive) heart failure: Secondary | ICD-10-CM | POA: Diagnosis present

## 2014-11-24 DIAGNOSIS — Z951 Presence of aortocoronary bypass graft: Secondary | ICD-10-CM | POA: Diagnosis not present

## 2014-11-24 DIAGNOSIS — E119 Type 2 diabetes mellitus without complications: Secondary | ICD-10-CM | POA: Diagnosis present

## 2014-11-24 LAB — GLUCOSE, CAPILLARY
GLUCOSE-CAPILLARY: 170 mg/dL — AB (ref 65–99)
GLUCOSE-CAPILLARY: 210 mg/dL — AB (ref 65–99)
GLUCOSE-CAPILLARY: 253 mg/dL — AB (ref 65–99)
Glucose-Capillary: 192 mg/dL — ABNORMAL HIGH (ref 65–99)

## 2014-11-24 LAB — CBC WITH DIFFERENTIAL/PLATELET
BASOS ABS: 0.1 10*3/uL (ref 0.0–0.1)
Basophils Relative: 1 % (ref 0–1)
EOS PCT: 0 % (ref 0–5)
Eosinophils Absolute: 0 10*3/uL (ref 0.0–0.7)
HEMATOCRIT: 33.1 % — AB (ref 39.0–52.0)
HEMOGLOBIN: 9.6 g/dL — AB (ref 13.0–17.0)
LYMPHS PCT: 14 % (ref 12–46)
Lymphs Abs: 1.7 10*3/uL (ref 0.7–4.0)
MCH: 19.4 pg — ABNORMAL LOW (ref 26.0–34.0)
MCHC: 29 g/dL — ABNORMAL LOW (ref 30.0–36.0)
MCV: 66.9 fL — AB (ref 78.0–100.0)
MONOS PCT: 12 % (ref 3–12)
Monocytes Absolute: 1.4 10*3/uL — ABNORMAL HIGH (ref 0.1–1.0)
NEUTROS PCT: 73 % (ref 43–77)
Neutro Abs: 8.6 10*3/uL — ABNORMAL HIGH (ref 1.7–7.7)
Platelets: 316 10*3/uL (ref 150–400)
RBC: 4.95 MIL/uL (ref 4.22–5.81)
RDW: 21.4 % — AB (ref 11.5–15.5)
WBC: 11.8 10*3/uL — AB (ref 4.0–10.5)

## 2014-11-24 LAB — BASIC METABOLIC PANEL
ANION GAP: 7 (ref 5–15)
BUN: 37 mg/dL — AB (ref 6–20)
CHLORIDE: 96 mmol/L — AB (ref 101–111)
CO2: 26 mmol/L (ref 22–32)
Calcium: 8.3 mg/dL — ABNORMAL LOW (ref 8.9–10.3)
Creatinine, Ser: 1.59 mg/dL — ABNORMAL HIGH (ref 0.61–1.24)
GFR calc Af Amer: 54 mL/min — ABNORMAL LOW (ref >60)
GFR, EST NON AFRICAN AMERICAN: 47 mL/min — AB (ref >60)
Glucose, Bld: 152 mg/dL — ABNORMAL HIGH (ref 65–99)
POTASSIUM: 4.6 mmol/L (ref 3.5–5.1)
SODIUM: 129 mmol/L — AB (ref 135–145)

## 2014-11-24 LAB — CARBOXYHEMOGLOBIN
Carboxyhemoglobin: 2.6 % — ABNORMAL HIGH (ref 0.5–1.5)
METHEMOGLOBIN: 0.8 % (ref 0.0–1.5)
O2 Saturation: 66.4 %
TOTAL HEMOGLOBIN: 9.8 g/dL — AB (ref 13.5–18.0)

## 2014-11-24 MED ORDER — MILRINONE IN DEXTROSE 20 MG/100ML IV SOLN
0.2500 ug/kg/min | INTRAVENOUS | Status: DC
  Administered 2014-11-24 – 2014-11-25 (×3): 0.25 ug/kg/min via INTRAVENOUS
  Filled 2014-11-24 (×3): qty 100

## 2014-11-24 MED ORDER — INSULIN GLARGINE 100 UNIT/ML ~~LOC~~ SOLN
10.0000 [IU] | Freq: Every day | SUBCUTANEOUS | Status: DC
  Administered 2014-11-24: 10 [IU] via SUBCUTANEOUS
  Filled 2014-11-24 (×3): qty 0.1

## 2014-11-24 MED ORDER — AMIODARONE LOAD VIA INFUSION
150.0000 mg | Freq: Once | INTRAVENOUS | Status: AC
Start: 2014-11-24 — End: 2014-11-24
  Administered 2014-11-24: 150 mg via INTRAVENOUS
  Filled 2014-11-24: qty 83.34

## 2014-11-24 NOTE — Progress Notes (Signed)
  Echocardiogram 2D Echocardiogram has been performed.  Delcie Roch 11/24/2014, 9:54 AM

## 2014-11-24 NOTE — Progress Notes (Signed)
Subjective: Patient was transferred yesterday to the ICU 2/2 cardiogenic shock and is being followed by the heart failure team.  Central line was also placed and now receiving norepinephrine and milrinone and diuresed with IV lasix.  Also was in a-fib and started on amiodarone via central line.  Blood pressure overnight has improved with good diuresis.    Today, patient states he is doing much better, no new complaints overnight.  Denies any chest pain or shortness of breath.  Objective: Vital signs in last 24 hours: Filed Vitals:   11/24/14 1233 11/24/14 1235 11/24/14 1300 11/24/14 1445  BP:  110/75  98/56  Pulse:   118   Temp: 97.5 F (36.4 C)     TempSrc: Oral     Resp:   25   Height:      Weight:      SpO2:   93%    Weight change: -9 lb (-4.082 kg)  Intake/Output Summary (Last 24 hours) at 11/24/14 1458 Last data filed at 11/24/14 1300  Gross per 24 hour  Intake 1758.83 ml  Output   3875 ml  Net -2116.17 ml   General: resting in bed, well appearing, no distress HEENT: EOMI, right IJ in place Cardiac: irregular rate, irregular rhythm Pulm: normal work of breathing, clear to auscultation Abd: soft, nontender, nondistended, BS present GU: foley catheter in place Ext: warm and well perfused, 2+ pedal edema Neuro: alert and oriented X3  Lab Results: Basic Metabolic Panel:  Recent Labs Lab 11/23/14 0328 11/24/14 0455  NA 130* 129*  K 4.6 4.6  CL 97* 96*  CO2 25 26  GLUCOSE 276* 152*  BUN 26* 37*  CREATININE 1.13 1.59*  CALCIUM 8.6* 8.3*   CBC:  Recent Labs Lab 11/23/14 0328 11/24/14 0455  WBC 10.4 11.8*  NEUTROABS 8.0* 8.6*  HGB 10.0* 9.6*  HCT 35.7* 33.1*  MCV 69.3* 66.9*  PLT 339 316   Cardiac Enzymes:  Recent Labs Lab 11/23/14 0328 11/23/14 1058 11/23/14 1605  TROPONINI 0.06* 0.05* 0.05*   CBG:  Recent Labs Lab 11/23/14 1829 11/23/14 2318 11/24/14 0821 11/24/14 1231  GLUCAP 223* 200* 170* 210*    Medications: Scheduled  Meds: . aspirin EC  81 mg Oral Daily  . atorvastatin  40 mg Oral q1800  . furosemide  80 mg Intravenous BID  . insulin aspart  0-20 Units Subcutaneous TID WC  . insulin glargine  10 Units Subcutaneous Daily  . rivaroxaban  20 mg Oral QAC supper  . sodium chloride  3 mL Intravenous Q12H   Continuous Infusions: . amiodarone 30 mg/hr (11/24/14 1101)  . milrinone 0.25 mcg/kg/min (11/24/14 1229)  . norepinephrine (LEVOPHED) Adult infusion Stopped (11/24/14 1000)   PRN Meds:.albuterol, ALPRAZolam, ondansetron **OR** ondansetron (ZOFRAN) IV, oxyCODONE Assessment/Plan: Active Problems:   Acute on chronic heart failure   Atrial fibrillation, unspecified   Acute on chronic combined systolic and diastolic CHF (congestive heart failure)  Acute on chronic systolic CHF -heart failure team following and appreciate their recommendations -repeat ECHO today with EF of 30-35%, improved from previous studies -patient is hemodynamically improved on 2 pressors --- Milrinone 0.53mcg/kg/min and norepi 40mcg/min with plan to start weaning norepi.. -hold spironolactone   -Lasix  IV bid -creatine elevated today possibly due to hypotension.  Will follow with BMPs  Atrial fibrillation with RVR -amiodarone /hr for rate control -xarelto  daily -hold beta blockers with cardiogenic shock  Diabetes mellitus type II -glucose has been elevated in the 200s -sliding scale  insulin -add Lantus 10 units daily  Dispo: Disposition is deferred at this time, awaiting improvement of current medical problems.  Patient has housing issue of being homeless.  Social work has trying to find him housing in the past.  He does not want to go to shelter.  [facility] is trying to help find him housing per Dr. Prescott Gum note.  The patient does have a current PCP (Jacob Angst, MD) and does not need an [facility] hospital follow-up appointment after discharge.  The patient does have  transportation limitations that hinder transportation to clinic appointments.    LOS: 1 day   [name], DO 11/24/2014, 2:58 PM

## 2014-11-24 NOTE — Progress Notes (Signed)
Advanced Heart Failure Rounding Note   Subjective:    Yesterday he was transferred to ICU--> cardiogenic shock. He was also back in A fib so he was loaded on amio. Central line placed. Initial CO-OX 44% and CVP 27. Started on norepi due to hypotension and diuresed with IV lasix. Milrinone started at 0200 with improved urine output noted. BP over night improved.   9/1 Norepi 6 mcg, Milrinone 0.25 mcg, Amio 30 mg per houre  Today he denies SOB.    CO-OX 44>66%  Creatinine 1.1>1.5    Objective:   Weight Range:  Vital Signs:   Temp:  [97.8 F (36.6 C)-98.3 F (36.8 C)] 98.2 F (36.8 C) (09/01 0400) Pulse Rate:  [59-109] 91 (09/01 0630) Resp:  [8-41] 21 (09/01 0630) BP: (73-146)/(33-94) 132/84 mmHg (09/01 0630) SpO2:  [80 %-100 %] 100 % (09/01 0630) Weight:  [221 lb 12.5 oz (100.6 kg)] 221 lb 12.5 oz (100.6 kg) (09/01 0600) Last BM Date: 11/22/14  Weight change: Filed Weights   11/23/14 0621 11/23/14 0723 11/24/14 0600  Weight: 218 lb 5 oz (99.026 kg) 223 lb (101.152 kg) 221 lb 12.5 oz (100.6 kg)    Intake/Output:   Intake/Output Summary (Last 24 hours) at 11/24/14 0739 Last data filed at 11/24/14 0630  Gross per 24 hour  Intake 1114.08 ml  Output   1825 ml  Net -710.92 ml     Physical Exam: CVP 16  General:  Well appearing. No resp difficulty HEENT: normal. RIJ  Neck: supple. JVP to jaw . Carotids 2+ bilat; no bruits. No lymphadenopathy or thryomegaly appreciated. Cor: PMI nondisplaced. Irregular rate & rhythm. No rubs, or murmurs.+ S3  Lungs: clear Abdomen: soft, nontender, nondistended. No hepatosplenomegaly. No bruits or masses. Good bowel sounds. Extremities: no cyanosis, clubbing, rash, R and LLE 2-3+edema Neuro: alert & orientedx3, cranial nerves grossly intact. moves all 4 extremities w/o difficulty. Affect pleasant GU:  foley   Telemetry: A fib 90s   Labs: Basic Metabolic Panel:  Recent Labs Lab 11/23/14 0328 11/24/14 0455  NA 130* 129*  K 4.6  4.6  CL 97* 96*  CO2 25 26  GLUCOSE 276* 152*  BUN 26* 37*  CREATININE 1.13 1.59*  CALCIUM 8.6* 8.3*    Liver Function Tests: No results for input(s): AST, ALT, ALKPHOS, BILITOT, PROT, ALBUMIN in the last 168 hours. No results for input(s): LIPASE, AMYLASE in the last 168 hours. No results for input(s): AMMONIA in the last 168 hours.  CBC:  Recent Labs Lab 11/23/14 0328 11/24/14 0455  WBC 10.4 11.8*  NEUTROABS 8.0* PENDING  HGB 10.0* 9.6*  HCT 35.7* 33.1*  MCV 69.3* 66.9*  PLT 339 PENDING    Cardiac Enzymes:  Recent Labs Lab 11/23/14 0328 11/23/14 1058 11/23/14 1605  TROPONINI 0.06* 0.05* 0.05*    BNP: BNP (last 3 results)  Recent Labs  10/29/14 1540 11/11/14 1833 11/23/14 0328  BNP 1232.4* 807.3* 866.8*    ProBNP (last 3 results)  Recent Labs  09/06/14 1107 09/20/14 1201 10/04/14 1256  PROBNP 1577.00* 3006.00* 2524.00*      Other results:  Imaging: Dg Chest Port 1 View  11/23/2014   CLINICAL DATA:  Central line placement.  EXAM: PORTABLE CHEST - 1 VIEW  COMPARISON:  11/23/2014  FINDINGS: There has been interval placement of right internal jugular approach central venous catheter, tip overlies the expected location of inferior superior vena cava. There is no evidence of pneumothorax.  Cardiomediastinal silhouette is stably enlarged. Mediastinal contours appear intact.  Median sternotomy wires are stable. Single lead cardiac pacemaker is unchanged.  There is no evidence of focal airspace consolidation, or pneumothorax. There is diffuse increase in the interstitial markings. There is a right pleural effusion.  Osseous structures are without acute abnormality. Soft tissues are grossly normal.  IMPRESSION: Status post right internal jugular approach central venous catheter placement. No evidence of pneumothorax.  Right pleural effusion and possible pulmonary vascular congestion.   Electronically Signed   By: Ted Mcalpine M.D.   On: 11/23/2014 18:36    Dg Chest Port 1 View  11/23/2014   CLINICAL DATA:  Acute respiratory distress.  EXAM: PORTABLE CHEST - 1 VIEW  COMPARISON:  11/23/2014 at 0336 hr  FINDINGS: The heart is enlarged but stable. Stable surgical changes from bypass surgery. Persistent pulmonary edema, pleural effusions and atelectasis. There is however slight improved aeration which may reflect slightly less edema.  IMPRESSION: Persistent edema and right pleural effusion but slight improved aeration   Electronically Signed   By: Rudie Meyer M.D.   On: 11/23/2014 14:53   Dg Chest Portable 1 View  11/23/2014   CLINICAL DATA:  Palpitations and dyspnea  EXAM: PORTABLE CHEST - 1 VIEW  COMPARISON:  11/12/2014  FINDINGS: There is unchanged cardiomegaly. Ground-glass opacities persist in the central and basilar regions bilaterally. Right pleural effusion persists. Vascular and interstitial congestive changes are present, slightly less severe than on 11/12/2014.  IMPRESSION: Congestive heart failure with slightly less severity compared to 11/12/2014.   Electronically Signed   By: Ellery Plunk M.D.   On: 11/23/2014 04:10      Medications:     Scheduled Medications: . aspirin EC  81 mg Oral Daily  . atorvastatin  40 mg Oral q1800  . furosemide  80 mg Intravenous BID  . insulin aspart  0-20 Units Subcutaneous TID WC  . rivaroxaban  20 mg Oral QAC supper  . sodium chloride  3 mL Intravenous Q12H  . spironolactone  25 mg Oral Daily     Infusions: . amiodarone 30 mg/hr (11/23/14 2319)  . milrinone 0.25 mcg/kg/min (11/24/14 0150)  . norepinephrine (LEVOPHED) Adult infusion 6 mcg/min (11/24/14 0630)     PRN Medications:  albuterol, ALPRAZolam, ondansetron **OR** ondansetron (ZOFRAN) IV, oxyCODONE   Assessment:  1. Acute on chronic systolic CHF, ICM EF 20-25% with poor RV function, NYHA class IV -> Cardiogenic shock 2. Acute hypoxic respiratory failure 3. Chronic Afib RVR -  4. 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 DM type  II 6. Homelessness 7. Hypotension  8. AKI 9. Hyponatremia   Plan/Discussion:   Hemodynamically improved on dual pressors. CO-OX improved 66%. On milrinone 0.25 mcg + norepi 6 mcg. SBP 130s. Can start to wean norepi.  Still with volume overload. Renal function up likely due hypotensive episodes.Stop spiro.  Will need to watch closely. No BB with cardiogenic shock. Repeat ECHO today.   Remains in A fib. Continue to load on amio--> 5 grams.   Glucose uncontrolled. Continue sliding scale and add lantus.    Length of Stay: 1   CLEGG,AMY NP-C  11/24/2014, 7:39 AM  Advanced Heart Failure Team Pager [phone] (M-F; 7a - 4p)  Please contact [facility] for night-coverage after hours (4p -7a ) and weekends on [email or web address]  Patient seen and examined with Tonye Becket, NP. We discussed all aspects of the encounter. I agree with the assessment and plan as stated above.   Much improved with diuresis and addition of milrinone. However CVP remains  16. Will continue diuresis. EF 30-35% on echo.   I have gone back through all ECGs in [facility] and AF looks chronic at least back until 2014 so DC-CV may not be an option. Currently using amio for rate control.   Long talk with him about his social situation. He is homeless after his family threw him out after losing > $150K gambling. Now living on streets. I also spoke to [employer] from [employer] and they are trying to find him housing. He will not go to shelter.   Agree with adding Lantus.   Not candidate for home inotropes or advanced therapies.   Bensimhon, Daniel,MD 2:28 PM

## 2014-11-24 NOTE — Progress Notes (Signed)
Patient's HR 100-120's while at rest. BP soft 91/53. Dr. Gala Romney and Tonye Becket, NP aware and at bedside. Orders for 150 mg amiodarone bolus, and to increase amiodarone gtt to 60 mg/hr. Will continue to monitor closely.

## 2014-11-25 ENCOUNTER — Ambulatory Visit: Payer: Medicaid Other | Admitting: Family Medicine

## 2014-11-25 DIAGNOSIS — R0603 Acute respiratory distress: Secondary | ICD-10-CM | POA: Insufficient documentation

## 2014-11-25 DIAGNOSIS — E871 Hypo-osmolality and hyponatremia: Secondary | ICD-10-CM

## 2014-11-25 DIAGNOSIS — R570 Cardiogenic shock: Secondary | ICD-10-CM | POA: Insufficient documentation

## 2014-11-25 LAB — BASIC METABOLIC PANEL
ANION GAP: 7 (ref 5–15)
BUN: 32 mg/dL — ABNORMAL HIGH (ref 6–20)
CALCIUM: 7.9 mg/dL — AB (ref 8.9–10.3)
CO2: 30 mmol/L (ref 22–32)
CREATININE: 1.4 mg/dL — AB (ref 0.61–1.24)
Chloride: 91 mmol/L — ABNORMAL LOW (ref 101–111)
GFR, EST NON AFRICAN AMERICAN: 54 mL/min — AB (ref >60)
Glucose, Bld: 208 mg/dL — ABNORMAL HIGH (ref 65–99)
Potassium: 3.7 mmol/L (ref 3.5–5.1)
Sodium: 128 mmol/L — ABNORMAL LOW (ref 135–145)

## 2014-11-25 LAB — GLUCOSE, CAPILLARY
GLUCOSE-CAPILLARY: 217 mg/dL — AB (ref 65–99)
Glucose-Capillary: 284 mg/dL — ABNORMAL HIGH (ref 65–99)
Glucose-Capillary: 252 mg/dL — ABNORMAL HIGH (ref 65–99)

## 2014-11-25 LAB — CARBOXYHEMOGLOBIN
CARBOXYHEMOGLOBIN: 2.6 % — AB (ref 0.5–1.5)
METHEMOGLOBIN: 0.8 % (ref 0.0–1.5)
O2 SAT: 69.1 %
Total hemoglobin: 10.2 g/dL — ABNORMAL LOW (ref 13.5–18.0)

## 2014-11-25 LAB — MAGNESIUM: Magnesium: 1.5 mg/dL — ABNORMAL LOW (ref 1.7–2.4)

## 2014-11-25 MED ORDER — MAGNESIUM SULFATE 4 GM/100ML IV SOLN
4.0000 g | Freq: Once | INTRAVENOUS | Status: DC
  Filled 2014-11-25: qty 100

## 2014-11-25 MED ORDER — FUROSEMIDE 10 MG/ML IJ SOLN
80.0000 mg | Freq: Two times a day (BID) | INTRAMUSCULAR | Status: AC
  Administered 2014-11-25: 80 mg via INTRAVENOUS
  Filled 2014-11-25 (×2): qty 8

## 2014-11-25 MED ORDER — POTASSIUM CHLORIDE CRYS ER 20 MEQ PO TBCR
40.0000 meq | EXTENDED_RELEASE_TABLET | Freq: Once | ORAL | Status: AC
  Administered 2014-11-25: 40 meq via ORAL
  Filled 2014-11-25: qty 2

## 2014-11-25 MED ORDER — POTASSIUM CHLORIDE CRYS ER 20 MEQ PO TBCR
40.0000 meq | EXTENDED_RELEASE_TABLET | Freq: Two times a day (BID) | ORAL | Status: DC
  Administered 2014-11-25 – 2014-11-26 (×2): 40 meq via ORAL
  Filled 2014-11-25 (×2): qty 2

## 2014-11-25 MED ORDER — MAGNESIUM SULFATE 4 GM/100ML IV SOLN
4.0000 g | Freq: Once | INTRAVENOUS | Status: AC
  Administered 2014-11-25: 4 g via INTRAVENOUS
  Filled 2014-11-25: qty 100

## 2014-11-25 MED ORDER — MILRINONE IN DEXTROSE 20 MG/100ML IV SOLN
0.1250 ug/kg/min | INTRAVENOUS | Status: DC
  Administered 2014-11-25 – 2014-11-29 (×4): 0.125 ug/kg/min via INTRAVENOUS
  Filled 2014-11-25 (×4): qty 100

## 2014-11-25 MED ORDER — TORSEMIDE 20 MG PO TABS
60.0000 mg | ORAL_TABLET | Freq: Two times a day (BID) | ORAL | Status: DC
Start: 2014-11-25 — End: 2014-11-26
  Administered 2014-11-25 – 2014-11-26 (×2): 60 mg via ORAL
  Filled 2014-11-25 (×2): qty 3

## 2014-11-25 MED ORDER — AMIODARONE HCL 200 MG PO TABS
400.0000 mg | ORAL_TABLET | Freq: Two times a day (BID) | ORAL | Status: DC
  Administered 2014-11-26: 400 mg via ORAL
  Filled 2014-11-25: qty 2

## 2014-11-25 MED ORDER — MAGNESIUM SULFATE 2 GM/50ML IV SOLN: 2.0000 g | Freq: Once | INTRAVENOUS | Status: DC

## 2014-11-25 MED ORDER — INSULIN GLARGINE 100 UNIT/ML ~~LOC~~ SOLN
10.0000 [IU] | Freq: Two times a day (BID) | SUBCUTANEOUS | Status: DC
  Administered 2014-11-25 – 2014-12-04 (×19): 10 [IU] via SUBCUTANEOUS
  Filled 2014-11-25 (×25): qty 0.1

## 2014-11-25 NOTE — Progress Notes (Signed)
CARDIAC REHAB PHASE I   PRE:  Rate/Rhythm: 113 afib    BP: sitting 113/75    SaO2: 97 RA  MODE:  Ambulation: 350 ft   POST:  Rate/Rhythm: 129 afib max    BP: sitting 95/69     SaO2: 97 RA  Pt reluctant to walk at first due to central line in his neck. Became agreeable. Fairly steady walking with assist x2 (for equipment). Pt talked entire walk. Rest x1, wanted to sit but able to remain standing. To recliner after walk, happy to be in chair. Will f/u. [phone]   Elissa Lovett Hodgen CES, ACSM 11/25/2014 2:36 PM

## 2014-11-25 NOTE — Progress Notes (Signed)
Advanced Heart Failure Rounding Note   Subjective:    Transferred to ICU on 8/31 --> cardiogenic shock. He was also back in A fib so he was loaded on amio. Central line placed. Initial CO-OX 44% and CVP 27. Started on norepi due to hypotension and diuresed with IV lasix.   9/1  Milrinone 0.25 mcg, Amio increased to 60 mg per hour,  norepi was weaned off. Brisk diuresis noted.  9/2 Milrinone 0.25 mcg+ Amio 60 mg per hour+ Norepi 5 mcg.   Earlier this morning norepi restarted @ .due to hypotension. Denies SOB/Orthopnea.    CO-OX 44>66>69%  Creatinine 1.1>1.5 >1.4 Mag 1.5   Objective:   Weight Range:  Vital Signs:   Temp:  [97.5 F (36.4 C)-99.2 F (37.3 C)] 98.6 F (37 C) (09/02 0400) Pulse Rate:  [25-125] 109 (09/02 0535) Resp:  [15-25] 20 (09/02 0535) BP: (82-139)/(50-121) 139/121 mmHg (09/02 0535) SpO2:  [88 %-100 %] 92 % (09/02 0535) Weight:  [218 lb 14.7 oz (99.3 kg)] 218 lb 14.7 oz (99.3 kg) (09/02 0500) Last BM Date: 11/22/14  Weight change: Filed Weights   11/23/14 0723 11/24/14 0600 11/25/14 0500  Weight: 223 lb (101.152 kg) 221 lb 12.5 oz (100.6 kg) 218 lb 14.7 oz (99.3 kg)    Intake/Output:   Intake/Output Summary (Last 24 hours) at 11/25/14 0731 Last data filed at 11/25/14 0600  Gross per 24 hour  Intake 1550.1 ml  Output   5985 ml  Net -4434.9 ml     Physical Exam: CVP 9  General:  Well appearing. No resp difficulty HEENT: normal. RIJ  Neck: supple. JVP 9-10 . Carotids 2+ bilat; no bruits. No lymphadenopathy or thryomegaly appreciated. Cor: PMI nondisplaced. Irregular rate & rhythm. No rubs, or murmurs.+ S3  Lungs: clear Abdomen: soft, nontender, nondistended. No hepatosplenomegaly. No bruits or masses. Good bowel sounds. Extremities: no cyanosis, clubbing, rash, R and LLE trace-1+ edema Neuro: alert & orientedx3, cranial nerves grossly intact. moves all 4 extremities w/o difficulty. Affect pleasant GU:  foley   Telemetry: A fib 90s    Labs: Basic Metabolic Panel:  Recent Labs Lab 11/23/14 0328 11/24/14 0455 11/25/14 0500  NA 130* 129* 128*  K 4.6 4.6 3.7  CL 97* 96* 91*  CO2 GLUCOSE 276* 152* 208*  BUN 26* 37* 32*  CREATININE 1.13 1.59* 1.40*  CALCIUM 8.6* 8.3* 7.9*  MG  --   --  1.5*    Liver Function Tests: No results for input(s): AST, ALT, ALKPHOS, BILITOT, PROT, ALBUMIN in the last 168 hours. No results for input(s): LIPASE, AMYLASE in the last 168 hours. No results for input(s): AMMONIA in the last 168 hours.  CBC:  Recent Labs Lab 11/23/14 0328 11/24/14 0455  WBC 10.4 11.8*  NEUTROABS 8.0* 8.6*  HGB 10.0* 9.6*  HCT 35.7* 33.1*  MCV 69.3* 66.9*  PLT 339 316    Cardiac Enzymes:  Recent Labs Lab 11/23/14 0328 11/23/14 1058 11/23/14 1605  TROPONINI 0.06* 0.05* 0.05*    BNP: BNP (last 3 results)  Recent Labs  10/29/14 1540 11/11/14 1833 11/23/14 0328  BNP 1232.4* 807.3* 866.8*    ProBNP (last 3 results)  Recent Labs  09/06/14 1107 09/20/14 1201 10/04/14 1256  PROBNP 1577.00* 3006.00* 2524.00*      Other results:  Imaging: Dg Chest Port 1 View  11/23/2014   CLINICAL DATA:  Central line placement.  EXAM: PORTABLE CHEST - 1 VIEW  COMPARISON:  11/23/2014  FINDINGS: There has  been interval placement of right internal jugular approach central venous catheter, tip overlies the expected location of inferior superior vena cava. There is no evidence of pneumothorax.  Cardiomediastinal silhouette is stably enlarged. Mediastinal contours appear intact. Median sternotomy wires are stable. Single lead cardiac pacemaker is unchanged.  There is no evidence of focal airspace consolidation, or pneumothorax. There is diffuse increase in the interstitial markings. There is a right pleural effusion.  Osseous structures are without acute abnormality. Soft tissues are grossly normal.  IMPRESSION: Status post right internal jugular approach central venous catheter placement. No  evidence of pneumothorax.  Right pleural effusion and possible pulmonary vascular congestion.   Electronically Signed   By: Ted Mcalpine M.D.   On: 11/23/2014 18:36   Dg Chest Port 1 View  11/23/2014   CLINICAL DATA:  Acute respiratory distress.  EXAM: PORTABLE CHEST - 1 VIEW  COMPARISON:  11/23/2014 at 0336 hr  FINDINGS: The heart is enlarged but stable. Stable surgical changes from bypass surgery. Persistent pulmonary edema, pleural effusions and atelectasis. There is however slight improved aeration which may reflect slightly less edema.  IMPRESSION: Persistent edema and right pleural effusion but slight improved aeration   Electronically Signed   By: Rudie Meyer M.D.   On: 11/23/2014 14:53     Medications:     Scheduled Medications: . aspirin EC  81 mg Oral Daily  . atorvastatin  40 mg Oral q1800  . furosemide  80 mg Intravenous BID  . insulin aspart  0-20 Units Subcutaneous TID WC  . insulin glargine  10 Units Subcutaneous Daily  . rivaroxaban  20 mg Oral QAC supper  . sodium chloride  3 mL Intravenous Q12H    Infusions: . amiodarone 60 mg/hr (11/25/14 0536)  . milrinone 0.25 mcg/kg/min (11/25/14 0536)  . norepinephrine (LEVOPHED) Adult infusion 5 mcg/min (11/24/14 2000)    PRN Medications: albuterol, ALPRAZolam, ondansetron **OR** ondansetron (ZOFRAN) IV, oxyCODONE   Assessment:  1. Acute on chronic systolic CHF, ICM EF 20-25% with poor RV function, NYHA class IV -> Cardiogenic shock 2. Acute hypoxic respiratory failure 3. Chronic Afib RVR -  4. 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 DM type II 6. Homelessness 7. Hypotension  8. AKI 9. Hyponatremia 10. Hypomagnesium   Plan/Discussion:   Hemodynamically improved on dual pressors. He is back on norepi 5 mcg and continues on milrinone 0.25 mcg.  CO-OX improved 69%. Hopefully can start to wean to norepi today. Cut milrinone back to 0125 mcg. Volume status much improved today. CVP today is 9.  Will give one more dose of  IV lasix and transition to torsemide 60 mg twice a day. (prior to admit he was on lasix 120 mg in am and 80 mg in pm) Can start to wean norepi.  Give till with volume overload. Renal function down a little.  Will need to watch closely. No BB with cardiogenic shock.   ECHO Ef 30-35%.    Remains in A fib. Continue to load on amio--> 5 grams. Continue amio 60 mg per hour today. He will be able to transition to 400 mg twice a day tomorrow. Continue Xarelto 20 mg daily.   Glucose uncontrolled. Continue sliding scale and add lantus.   Mag low 1.5 --> Give 4 grams mag today.   Consult palliative care for goals of care. He is not a candidate for advanced therapies as he is homeless with limited social support.   Mobilize today   Length of Stay: 2  CLEGG,AMY NP-C  11/25/2014, 7:31 AM  Advanced Heart Failure Team Pager [phone] (M-F; 7a - 4p)  Please contact [facility] for night-coverage after hours (4p -7a ) and weekends on [email or web address]  Patient seen and examined with Tonye Becket, NP. We discussed all aspects of the encounter. I agree with the assessment and plan as stated above.   Volume status improved but remains on pressors. Will cut milrinone down to 0.125 and try to wean levophed slowly. Continue amio for rate control. Given duration of AF and size of LA on echo (5.4cm) doubt DC-CV is an option. Will supp mag. Ongoing efforts to work on housing for him.   Told him he will likely be in house 4-5 more days and he was not happy about that.   D/w IM team at bedside.   Bensimhon, Daniel,MD 1:50 PM

## 2014-11-25 NOTE — Progress Notes (Signed)
Subjective: Patient transferred to ICU on 8/31 secondary to cardiogenic shock and started on milrinone and norepinephrine.  He was also in atrial fibrillation and started on amiodarone.  Central line was placed.  Patient responded well to interventions and was attempted to wean off norepinephrine.   Norepi restarted back this morning at d/t hypotension.  Diuresis with furosemide with transition to torsemide  bid today.  ECHO with EF of 30-35%, improved from prior study.    Patient doing well this morning, no chest pain, no shortness of breath.  Had no complaints overnight.  Objective: Vital signs in last 24 hours: Filed Vitals:   11/25/14 0500 11/25/14 0535 11/25/14 0744 11/25/14 1045  BP:  139/121 125/63 95/54  Pulse:  109 102 122  Temp:   97.7 F (36.5 C)   TempSrc:      Resp:  Height:      Weight: 218 lb 14.7 oz (99.3 kg)     SpO2:  92% 95% 96%   Weight change: -4 lb 1.3 oz (-1.852 kg)  Intake/Output Summary (Last 24 hours) at 11/25/14 1139 Last data filed at 11/25/14 0800  Gross per 24 hour  Intake 3276.55 ml  Output   4460 ml  Net -1183.45 ml   General: resting in bed, well appearing, no distress HEENT: EOMI, right IJ in place Cardiac: irregular rate, irregular rhythm Pulm: normal work of breathing, clear to auscultation Abd: soft, nontender, nondistended, BS present GU: foley catheter in place Ext: warm and well perfused, 2+ pedal edema Neuro: alert and oriented X3  Lab Results: Basic Metabolic Panel:  Recent Labs Lab 11/24/14 0455 11/25/14 0500  NA 129* 128*  K 4.6 3.7  CL 96* 91*  CO2 26 30  GLUCOSE 152* 208*  BUN 37* 32*  CREATININE 1.59* 1.40*  CALCIUM 8.3* 7.9*  MG  --  1.5*   CBC:  Recent Labs Lab 11/23/14 0328 11/24/14 0455  WBC 10.4 11.8*  NEUTROABS 8.0* 8.6*  HGB 10.0* 9.6*  HCT 35.7* 33.1*  MCV 69.3* 66.9*  PLT 339 316   Cardiac Enzymes:  Recent Labs Lab 11/23/14 0328 11/23/14 1058 11/23/14 1605    TROPONINI 0.06* 0.05* 0.05*   CBG:  Recent Labs Lab 11/24/14 0821 11/24/14 1231 11/24/14 1655 11/24/14 2241 11/25/14 0937 11/25/14 1126  GLUCAP 170* 210* 192* 253* 284* 252*    Medications: Scheduled Meds: . [START ON 11/26/2014] amiodarone  400 mg Oral BID  . aspirin EC  81 mg Oral Daily  . atorvastatin  40 mg Oral q1800  . insulin aspart  0-20 Units Subcutaneous TID WC  . insulin glargine  10 Units Subcutaneous BID  . magnesium sulfate 1 - 4 g bolus IVPB  4 g Intravenous Once  . potassium chloride  40 mEq Oral BID  . rivaroxaban  20 mg Oral QAC supper  . sodium chloride  3 mL Intravenous Q12H  . torsemide  60 mg Oral BID   Continuous Infusions: . amiodarone 60 mg/hr (11/25/14 1115)  . milrinone 0.125 mcg/kg/min (11/25/14 0800)  . norepinephrine (LEVOPHED) Adult infusion Stopped (11/25/14 1043)   PRN Meds:.albuterol, ALPRAZolam, ondansetron **OR** ondansetron (ZOFRAN) IV, oxyCODONE Assessment/Plan: Active Problems:   Essential hypertension   CAD- s/p CABG July 2014 [facility]   DM (diabetes mellitus), type 2 with peripheral vascular complications   Anemia of chronic disease   Atrial fibrillation with rapid ventricular response   Homelessness   Acute on chronic heart failure   Atrial  fibrillation, unspecified   Acute on chronic combined systolic and diastolic CHF (congestive heart failure)   Acute on chronic systolic congestive heart failure  Acute on chronic systolic CHF -heart failure team following and appreciate their recommendations -repeat ECHO yesterday with EF of 30-35%, improved from previous studies -patient is hemodynamically improved on 2 pressors --- cutting back milrinone to 0.125 mcg/kg/min, norepi .   -hold spironolactone   -torsemide 60mg  bid -creatine improved today.  Continue to follow.  Magnesium low today at 1.5.  Repleting with 4 grams -CO-OX improved to 69%, CVP 9  Atrial fibrillation with RVR -amiodarone 60mg /hr for rate  control -xarelto 20mg  daily -hold beta blockers with cardiogenic shock  Diabetes mellitus type II -glucose has been elevated in the 200s -sliding scale insulin -add Lantus 10 units daily  Dispo: Disposition is deferred at this time, awaiting improvement of current medical problems.  Consult palliative care for goals of care as pt not a candidate for advanced therapies given homelessness and limited social support.  The patient does have a current PCP ([name], MD) and does not need an [facility] hospital follow-up appointment after discharge.  The patient does have transportation limitations that hinder transportation to clinic appointments.    LOS: 2 days   [name], DO 11/25/2014, 11:39 AM

## 2014-11-25 NOTE — Care Management (Signed)
Transitional Care Clinic:  Patient known to [facility] at [place], and patient indicates he would like to continue follow-up with Dr. Jarold Song at [facility] after discharge. This Case Manager met with patient once again at bedside, and  appointment rescheduled for 12/01/14 at 1130 with Dr. Jarold Song. AVS updated.  Patient aware he will receive a discharge follow-up phone call after discharge.

## 2014-11-25 NOTE — Progress Notes (Signed)
Pt cont to remove BP cuff after being asked several times to keep it on. I informed pt that he was on medications that required at least hourly b/p measurements. Pt still refuses to wear bp cuff.

## 2014-11-26 LAB — CARBOXYHEMOGLOBIN
CARBOXYHEMOGLOBIN: 2.1 % — AB (ref 0.5–1.5)
Carboxyhemoglobin: 2.2 % — ABNORMAL HIGH (ref 0.5–1.5)
METHEMOGLOBIN: 0.8 % (ref 0.0–1.5)
Methemoglobin: 0.4 % (ref 0.0–1.5)
O2 Saturation: 45.9 %
O2 Saturation: 47.7 %
Total hemoglobin: 10.4 g/dL — ABNORMAL LOW (ref 13.5–18.0)
Total hemoglobin: 8.5 g/dL — ABNORMAL LOW (ref 13.5–18.0)

## 2014-11-26 LAB — BASIC METABOLIC PANEL
ANION GAP: 7 (ref 5–15)
BUN: 49 mg/dL — ABNORMAL HIGH (ref 6–20)
CALCIUM: 8 mg/dL — AB (ref 8.9–10.3)
CO2: 32 mmol/L (ref 22–32)
Chloride: 88 mmol/L — ABNORMAL LOW (ref 101–111)
Creatinine, Ser: 1.25 mg/dL — ABNORMAL HIGH (ref 0.61–1.24)
Glucose, Bld: 194 mg/dL — ABNORMAL HIGH (ref 65–99)
Potassium: 4.7 mmol/L (ref 3.5–5.1)
Sodium: 127 mmol/L — ABNORMAL LOW (ref 135–145)

## 2014-11-26 LAB — GLUCOSE, CAPILLARY
GLUCOSE-CAPILLARY: 206 mg/dL — AB (ref 65–99)
GLUCOSE-CAPILLARY: 231 mg/dL — AB (ref 65–99)
Glucose-Capillary: 187 mg/dL — ABNORMAL HIGH (ref 65–99)
Glucose-Capillary: 238 mg/dL — ABNORMAL HIGH (ref 65–99)
Glucose-Capillary: 247 mg/dL — ABNORMAL HIGH (ref 65–99)

## 2014-11-26 MED ORDER — TORSEMIDE 20 MG PO TABS
20.0000 mg | ORAL_TABLET | Freq: Two times a day (BID) | ORAL | Status: DC
  Administered 2014-11-26 – 2014-11-27 (×2): 20 mg via ORAL
  Filled 2014-11-26 (×2): qty 1

## 2014-11-26 MED ORDER — INSULIN ASPART 100 UNIT/ML ~~LOC~~ SOLN
0.0000 [IU] | Freq: Three times a day (TID) | SUBCUTANEOUS | Status: DC
  Administered 2014-11-27 (×2): 11 [IU] via SUBCUTANEOUS
  Administered 2014-11-28: 3 [IU] via SUBCUTANEOUS
  Administered 2014-11-28: 7 [IU] via SUBCUTANEOUS
  Administered 2014-11-28: 4 [IU] via SUBCUTANEOUS
  Administered 2014-11-29: 3 [IU] via SUBCUTANEOUS
  Administered 2014-11-29: 4 [IU] via SUBCUTANEOUS
  Administered 2014-11-29 – 2014-11-30 (×2): 3 [IU] via SUBCUTANEOUS
  Administered 2014-11-30: 4 [IU] via SUBCUTANEOUS
  Administered 2014-11-30: 3 [IU] via SUBCUTANEOUS
  Administered 2014-12-01: 11 [IU] via SUBCUTANEOUS
  Administered 2014-12-01 – 2014-12-02 (×2): 4 [IU] via SUBCUTANEOUS
  Administered 2014-12-02: 3 [IU] via SUBCUTANEOUS
  Administered 2014-12-03 (×2): 4 [IU] via SUBCUTANEOUS
  Administered 2014-12-04: 7 [IU] via SUBCUTANEOUS
  Administered 2014-12-05 (×2): 3 [IU] via SUBCUTANEOUS

## 2014-11-26 MED ORDER — SPIRONOLACTONE 25 MG PO TABS
12.5000 mg | ORAL_TABLET | Freq: Every day | ORAL | Status: DC
Start: 2014-11-26 — End: 2014-11-27
  Administered 2014-11-26 – 2014-11-27 (×2): 12.5 mg via ORAL
  Filled 2014-11-26 (×2): qty 1

## 2014-11-26 MED ORDER — AMIODARONE HCL 200 MG PO TABS
200.0000 mg | ORAL_TABLET | Freq: Two times a day (BID) | ORAL | Status: DC
  Administered 2014-11-26 – 2014-12-02 (×12): 200 mg via ORAL
  Filled 2014-11-26 (×12): qty 1

## 2014-11-26 MED ORDER — INSULIN ASPART 100 UNIT/ML ~~LOC~~ SOLN
0.0000 [IU] | Freq: Every day | SUBCUTANEOUS | Status: DC
  Administered 2014-11-26 – 2014-12-01 (×2): 2 [IU] via SUBCUTANEOUS

## 2014-11-26 MED ORDER — INSULIN ASPART 100 UNIT/ML ~~LOC~~ SOLN
3.0000 [IU] | Freq: Three times a day (TID) | SUBCUTANEOUS | Status: DC
  Administered 2014-11-27 – 2014-12-05 (×20): 3 [IU] via SUBCUTANEOUS

## 2014-11-26 NOTE — Progress Notes (Signed)
Advanced Heart Failure Rounding Note   Subjective:    Transferred to ICU on 8/31 --> cardiogenic shock. He was also back in A fib so he was loaded on amio. Central line placed. Initial CO-OX 44% and CVP 27. Started on norepi due to hypotension and diuresed with IV lasix.   9/1  Milrinone 0.25 mcg, Amio increased to 60 mg per hour,  norepi was weaned off. Brisk diuresis noted.  9/2 Milrinone 0.25 mcg+ Amio 60 mg per hour+ Norepi 5 mcg.   Milrinone cut back to 0.125 yesterday. Co-ox down to 46% Remains on amio  Nearly 7L out overnight. Weight down 10 pounds.  Sitting up in chair. Eating. Feels good.    CO-OX 44>66>69%  Creatinine 1.1>1.5 >1.4> 1.25    Objective:   Weight Range:  Vital Signs:   Temp:  [97.4 F (36.3 C)-98.6 F (37 C)] 97.9 F (36.6 C) (09/02 2000) Pulse Rate:  [25-122] 101 (09/02 2145) Resp:  [13-34] 21 (09/02 2145) BP: (94-139)/(34-121) 99/62 mmHg (09/02 2145) SpO2:  [90 %-100 %] 95 % (09/02 2145) Weight:  [99.3 kg (218 lb 14.7 oz)] 99.3 kg (218 lb 14.7 oz) (09/02 0500) Last BM Date: 11/22/14  Weight change: Filed Weights   11/23/14 0723 11/24/14 0600 11/25/14 0500  Weight: 101.152 kg (223 lb) 100.6 kg (221 lb 12.5 oz) 99.3 kg (218 lb 14.7 oz)    Intake/Output:   Intake/Output Summary (Last 24 hours) at 11/26/14 0030 Last data filed at 11/25/14 2200  Gross per 24 hour  Intake 1515.95 ml  Output   5765 ml  Net -4249.05 ml     Physical Exam: General:  Sitting in chair. Well appearing. No resp difficulty HEENT: normal. RIJ  Neck: supple. JVP 9-10 . Carotids 2+ bilat; no bruits. No lymphadenopathy or thryomegaly appreciated. Cor: PMI nondisplaced. Irregular rate & rhythm. No rubs, or murmurs.+ S3  Lungs: clear Abdomen: soft, nontender, nondistended. No hepatosplenomegaly. No bruits or masses. Good bowel sounds. Extremities: no cyanosis, clubbing, rash, R and LLE trace-1+ edema Neuro: alert & orientedx3, cranial nerves grossly intact. moves all 4  extremities w/o difficulty. Affect pleasant GU:  foley   Telemetry: A fib 90s   Labs: Basic Metabolic Panel:  Recent Labs Lab 11/23/14 0328 11/24/14 0455 11/25/14 0500  NA 130* 129* 128*  K 4.6 4.6 3.7  CL 97* 96* 91*  CO2 25 26 30   GLUCOSE 276* 152* 208*  BUN 26* 37* 32*  CREATININE 1.13 1.59* 1.40*  CALCIUM 8.6* 8.3* 7.9*  MG  --   --  1.5*    Liver Function Tests: No results for input(s): AST, ALT, ALKPHOS, BILITOT, PROT, ALBUMIN in the last 168 hours. No results for input(s): LIPASE, AMYLASE in the last 168 hours. No results for input(s): AMMONIA in the last 168 hours.  CBC:  Recent Labs Lab 11/23/14 0328 11/24/14 0455  WBC 10.4 11.8*  NEUTROABS 8.0* 8.6*  HGB 10.0* 9.6*  HCT 35.7* 33.1*  MCV 69.3* 66.9*  PLT 339 316    Cardiac Enzymes:  Recent Labs Lab 11/23/14 0328 11/23/14 1058 11/23/14 1605  TROPONINI 0.06* 0.05* 0.05*    BNP: BNP (last 3 results)  Recent Labs  10/29/14 1540 11/11/14 1833 11/23/14 0328  BNP 1232.4* 807.3* 866.8*    ProBNP (last 3 results)  Recent Labs  09/06/14 1107 09/20/14 1201 10/04/14 1256  PROBNP 1577.00* 3006.00* 2524.00*      Other results:  Imaging: No results found.   Medications:     Scheduled  Medications: . amiodarone  400 mg Oral BID  . aspirin EC  81 mg Oral Daily  . atorvastatin  40 mg Oral q1800  . insulin aspart  0-20 Units Subcutaneous TID WC  . insulin glargine  10 Units Subcutaneous BID  . potassium chloride  40 mEq Oral BID  . rivaroxaban  20 mg Oral QAC supper  . sodium chloride  3 mL Intravenous Q12H  . torsemide  60 mg Oral BID    Infusions: . amiodarone 60 mg/hr (11/25/14 2342)  . milrinone 0.125 mcg/kg/min (11/25/14 2052)  . norepinephrine (LEVOPHED) Adult infusion Stopped (11/25/14 1043)    PRN Medications: albuterol, ALPRAZolam, ondansetron **OR** ondansetron (ZOFRAN) IV, oxyCODONE   Assessment:  1. Acute on chronic systolic CHF, ICM EF 20-25% with poor RV  function, NYHA class IV -> Cardiogenic shock 2. Acute hypoxic respiratory failure 3. Chronic Afib RVR -  4. 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 DM type II 6. Homelessness 7. Hypotension  8. AKI 9. Hyponatremia 10. Hypomagnesium   Plan/Discussion:    Milrinone titrated down yesterday. Brisk diuresis overnight. Feels better but co-ox low again. BP soft. Ideally would need to consider home inotropes or mechanical support but social situation won't permit. Will repeat co-ox. Cut back demadex to 20 bid. Add digoxin and spiro. Continue to titrate meds. Follow CVP. Can go to SDu.   Continue amio for rate control. Given duration of AF and size of LA on echo (5.4cm) doubt DC-CV is a durable option. Continue Xarelto.   Ongoing efforts to work on housing for him.    Bensimhon, Daniel,MD 12:30 AM Advanced Heart Failure Team Pager [phone] (M-F; 7a - 4p)  Please contact [facility] for night-coverage after hours (4p -7a ) and weekends on [email or web address]

## 2014-11-26 NOTE — Progress Notes (Signed)
Subjective: 59 y.o. male transferred to ICU 8/31 secondary to cardiogenic shock and started on milrinone and norepinephrine.  He was also in atrial fibrillation and started on amiodarone.  Central line was placed. Milrinone was titrated down yesterday, norepi stopped, transitioned to oral diuretics, and continued on amiodarone for rate control.  This morning, patient is asymptomatic although BP is still soft, he denies any chest pain, shortness of breath, diuresing well with Foley.  Complaining of having to keep his RIJ in place.  Objective: Vital signs in last 24 hours: Filed Vitals:   11/26/14 0445 11/26/14 0709 11/26/14 0747 11/26/14 0800  BP:  80/65  94/61  Pulse:   103 104  Temp:   97.8 F (36.6 C)   TempSrc:   Oral   Resp:   29 20  Height:      Weight: 208 lb 15.9 oz (94.8 kg)     SpO2:   100% 93%   Weight change: -9 lb 14.7 oz (-4.5 kg)  Intake/Output Summary (Last 24 hours) at 11/26/14 0837 Last data filed at 11/26/14 0800  Gross per 24 hour  Intake 2074.5 ml  Output   6190 ml  Net -4115.5 ml   General: sitting up in chair, watching TV, no distress HEENT: EOMI, right IJ in place Cardiac: irregular rate, irregular rhythm, mildly tachcardic Pulm: normal work of breathing, clear to auscultation Abd: soft, nontender, nondistended, BS present GU: foley catheter in place Ext: warm and well perfused, 2+ pedal edema Neuro: alert and oriented X3  Lab Results: Basic Metabolic Panel:  Recent Labs Lab 11/25/14 0500 11/26/14 0316  NA 128* 127*  K 3.7 4.7  CL 91* 88*  CO2 30 32  GLUCOSE 208* 194*  BUN 32* 49*  CREATININE 1.40* 1.25*  CALCIUM 7.9* 8.0*  MG 1.5*  --    CBC:  Recent Labs Lab 11/23/14 0328 11/24/14 0455  WBC 10.4 11.8*  NEUTROABS 8.0* 8.6*  HGB 10.0* 9.6*  HCT 35.7* 33.1*  MCV 69.3* 66.9*  PLT 339 316   Cardiac Enzymes:  Recent Labs Lab 11/23/14 0328 11/23/14 1058 11/23/14 1605  TROPONINI 0.06* 0.05* 0.05*   CBG:  Recent  Labs Lab 11/24/14 2241 11/25/14 0937 11/25/14 1126 11/25/14 1749 11/25/14 2118 11/26/14 0751  GLUCAP 253* 284* 252* 217* 187* 206*    Medications: Scheduled Meds: . amiodarone  400 mg Oral BID  . aspirin EC  81 mg Oral Daily  . atorvastatin  40 mg Oral q1800  . insulin aspart  0-20 Units Subcutaneous TID WC  . insulin glargine  10 Units Subcutaneous BID  . potassium chloride  40 mEq Oral BID  . rivaroxaban  20 mg Oral QAC supper  . sodium chloride  3 mL Intravenous Q12H  . torsemide  60 mg Oral BID   Continuous Infusions: . amiodarone 60 mg/hr (11/26/14 0800)  . milrinone 0.125 mcg/kg/min (11/26/14 0700)  . norepinephrine (LEVOPHED) Adult infusion Stopped (11/25/14 1043)   PRN Meds:.albuterol, ALPRAZolam, ondansetron **OR** ondansetron (ZOFRAN) IV, oxyCODONE Assessment/Plan: Active Problems:   Essential hypertension   CAD- s/p CABG July 2014 [facility]   DM (diabetes mellitus), type 2 with peripheral vascular complications   Anemia of chronic disease   Atrial fibrillation with rapid ventricular response   Homelessness   Acute on chronic heart failure   Atrial fibrillation, unspecified   Acute on chronic combined systolic and diastolic CHF (congestive heart failure)   Acute on chronic systolic congestive heart failure   Acute respiratory distress  Cardiogenic shock  Acute on chronic systolic CHF -heart failure team following and appreciate their recommendations -repeat ECHO this admission with EF of 30-35%, improved from previous studies -patient has been transitioned to PO diuretics with continued diursesis -milrinone titrated down to 0.125, norepi stopped -CO-OX 69 yesterday --> 45 this morning. -CVP 4  Atrial fibrillation with RVR -amiodarone 60mg /hr for rate control -xarelto 20mg  daily -hold beta blockers with cardiogenic shock  Diabetes mellitus type II -glucose has been elevated in the 200s -sliding scale insulin -Lantus  bid  Hyperlipidemia -aspirin 81mg  daily -Lipitor 40 mg daily  Dispo: Disposition is deferred at this time, awaiting improvement of current medical problems.  Consult palliative care for goals of care as pt not a candidate for advanced therapies given homelessness and limited social support.  The patient does have a current PCP ([name], MD) and does not need an [facility] hospital follow-up appointment after discharge.  The patient does have transportation limitations that hinder transportation to clinic appointments.    LOS: 3 days   [name], DO 11/26/2014, 8:37 AM

## 2014-11-26 NOTE — Progress Notes (Signed)
Pharmacist Heart Failure Core Measure Documentation  Assessment: Jacob [name] has an EF documented as 30-35% on 11/24/2014 by ECHO.  Rationale: Heart failure patients with left ventricular systolic dysfunction (LVSD) and an EF < 40% should be prescribed an angiotensin converting enzyme inhibitor (ACEI) or angiotensin receptor blocker (ARB) at discharge unless a contraindication is documented in the medical record.  This patient is not currently on an ACEI or ARB for HF.  This note is being placed in the record in order to provide documentation that a contraindication to the use of these agents is present for this encounter.  ACE Inhibitor or Angiotensin Receptor Blocker is contraindicated (specify all that apply)  []   ACEI allergy AND ARB allergy []   Angioedema []   Moderate or severe aortic stenosis []   Hyperkalemia [x]   Hypotension []   Renal artery stenosis [x]   Worsening renal function, preexisting renal disease or dysfunction   Elvin So 11/26/2014 12:44 PM

## 2014-11-26 NOTE — Progress Notes (Cosign Needed)
CARDIAC REHAB PHASE I   PRE:  Rate/Rhythm: 112 sinus tach  BP:  Supine:   Sitting: 97/62  Standing:    SaO2: 98% ra  MODE:  Ambulation: 300 ft   POST:  Rate/Rhythem: 124 sinus tach  BP:  Supine:   Sitting: 120/53  Standing:    SaO2: 98% ra  1407-1430  Pt ambulated in hallway x 2 assist steady gait.  Asymptomatic.  Pt requested standing rest break at end of walk.  Pt returned to chair with call light in reach.   Jacob [name], [place]

## 2014-11-27 ENCOUNTER — Encounter (HOSPITAL_COMMUNITY): Payer: Self-pay | Admitting: Physician Assistant

## 2014-11-27 DIAGNOSIS — R579 Shock, unspecified: Secondary | ICD-10-CM

## 2014-11-27 DIAGNOSIS — K922 Gastrointestinal hemorrhage, unspecified: Secondary | ICD-10-CM

## 2014-11-27 DIAGNOSIS — D62 Acute posthemorrhagic anemia: Secondary | ICD-10-CM | POA: Insufficient documentation

## 2014-11-27 DIAGNOSIS — K921 Melena: Secondary | ICD-10-CM

## 2014-11-27 DIAGNOSIS — R578 Other shock: Secondary | ICD-10-CM | POA: Insufficient documentation

## 2014-11-27 LAB — CBC
HCT: 22.4 % — ABNORMAL LOW (ref 39.0–52.0)
HEMATOCRIT: 22.3 % — AB (ref 39.0–52.0)
HEMATOCRIT: 23.2 % — AB (ref 39.0–52.0)
Hemoglobin: 6.5 g/dL — CL (ref 13.0–17.0)
Hemoglobin: 7.1 g/dL — ABNORMAL LOW (ref 13.0–17.0)
Hemoglobin: 6.8 g/dL — CL (ref 13.0–17.0)
MCH: 19.3 pg — ABNORMAL LOW (ref 26.0–34.0)
MCH: 21.5 pg — AB (ref 26.0–34.0)
MCH: 21.1 pg — AB (ref 26.0–34.0)
MCHC: 29.1 g/dL — AB (ref 30.0–36.0)
MCHC: 30.6 g/dL (ref 30.0–36.0)
MCHC: 30.4 g/dL (ref 30.0–36.0)
MCV: 66.2 fL — AB (ref 78.0–100.0)
MCV: 70.1 fL — ABNORMAL LOW (ref 78.0–100.0)
MCV: 69.6 fL — ABNORMAL LOW (ref 78.0–100.0)
PLATELETS: 303 10*3/uL (ref 150–400)
Platelets: 320 10*3/uL (ref 150–400)
Platelets: 312 10*3/uL (ref 150–400)
RBC: 3.37 MIL/uL — ABNORMAL LOW (ref 4.22–5.81)
RBC: 3.31 MIL/uL — ABNORMAL LOW (ref 4.22–5.81)
RBC: 3.22 MIL/uL — ABNORMAL LOW (ref 4.22–5.81)
RDW: 21.4 % — AB (ref 11.5–15.5)
RDW: 24.4 % — AB (ref 11.5–15.5)
RDW: 23.7 % — AB (ref 11.5–15.5)
WBC: 19 10*3/uL — ABNORMAL HIGH (ref 4.0–10.5)
WBC: 18.3 10*3/uL — ABNORMAL HIGH (ref 4.0–10.5)
WBC: 18.3 10*3/uL — ABNORMAL HIGH (ref 4.0–10.5)

## 2014-11-27 LAB — BASIC METABOLIC PANEL
Anion gap: 11 (ref 5–15)
BUN: 78 mg/dL — AB (ref 6–20)
CO2: 25 mmol/L (ref 22–32)
CREATININE: 1.84 mg/dL — AB (ref 0.61–1.24)
Calcium: 7.9 mg/dL — ABNORMAL LOW (ref 8.9–10.3)
Chloride: 88 mmol/L — ABNORMAL LOW (ref 101–111)
GFR calc Af Amer: 45 mL/min — ABNORMAL LOW (ref >60)
GFR, EST NON AFRICAN AMERICAN: 39 mL/min — AB (ref >60)
GLUCOSE: 280 mg/dL — AB (ref 65–99)
Potassium: 5.5 mmol/L — ABNORMAL HIGH (ref 3.5–5.1)
SODIUM: 124 mmol/L — AB (ref 135–145)

## 2014-11-27 LAB — CARBOXYHEMOGLOBIN
CARBOXYHEMOGLOBIN: 1.6 % — AB (ref 0.5–1.5)
CARBOXYHEMOGLOBIN: 2.7 % — AB (ref 0.5–1.5)
Carboxyhemoglobin: 1.9 % — ABNORMAL HIGH (ref 0.5–1.5)
METHEMOGLOBIN: 0.8 % (ref 0.0–1.5)
Methemoglobin: 0.9 % (ref 0.0–1.5)
Methemoglobin: 1.2 % (ref 0.0–1.5)
O2 SAT: 27 %
O2 SAT: 36.7 %
O2 Saturation: 83.9 %
TOTAL HEMOGLOBIN: 6.5 g/dL — AB (ref 13.5–18.0)
TOTAL HEMOGLOBIN: 6.1 g/dL — AB (ref 13.5–18.0)
Total hemoglobin: 6.4 g/dL — CL (ref 13.5–18.0)

## 2014-11-27 LAB — PREPARE RBC (CROSSMATCH)

## 2014-11-27 LAB — GLUCOSE, CAPILLARY
GLUCOSE-CAPILLARY: 267 mg/dL — AB (ref 65–99)
GLUCOSE-CAPILLARY: 264 mg/dL — AB (ref 65–99)
GLUCOSE-CAPILLARY: 77 mg/dL (ref 65–99)
Glucose-Capillary: 158 mg/dL — ABNORMAL HIGH (ref 65–99)

## 2014-11-27 LAB — ABO/RH: ABO/RH(D): O POS

## 2014-11-27 MED ORDER — NOREPINEPHRINE BITARTRATE 1 MG/ML IV SOLN
2.0000 ug/min | INTRAVENOUS | Status: DC
  Administered 2014-11-27: 5 ug/min via INTRAVENOUS
  Filled 2014-11-27 (×2): qty 16

## 2014-11-27 MED ORDER — SODIUM CHLORIDE 0.9 % IV SOLN: Freq: Once | INTRAVENOUS | Status: DC

## 2014-11-27 MED ORDER — SODIUM CHLORIDE 0.9 % IV BOLUS (SEPSIS)
500.0000 mL | Freq: Once | INTRAVENOUS | Status: AC
  Administered 2014-11-27: 500 mL via INTRAVENOUS

## 2014-11-27 MED ORDER — PANTOPRAZOLE SODIUM 40 MG IV SOLR
40.0000 mg | Freq: Two times a day (BID) | INTRAVENOUS | Status: DC
  Administered 2014-11-27 – 2014-11-28 (×3): 40 mg via INTRAVENOUS
  Filled 2014-11-27 (×3): qty 40

## 2014-11-27 MED ORDER — ALTEPLASE 2 MG IJ SOLR
2.0000 mg | Freq: Once | INTRAMUSCULAR | Status: DC
  Filled 2014-11-27: qty 2

## 2014-11-27 NOTE — Progress Notes (Signed)
Attempted to collect co ox at 1600 and brown port on CVC clotted. Ordered alteplase and order entered for iv therapy to declot CVC at 1638, await IV team.   Tammy Sours

## 2014-11-27 NOTE — Progress Notes (Signed)
Subjective: States he feels better but he looks more tired today. Had hgb drop to 6.5 from 9.6 three days ago. Having dark stool. No other source of bleeding. States breathing is fine and no chest pain. No abd pain/heart burn.   Objective: Vital signs in last 24 hours: Filed Vitals:   11/26/14 1949 11/26/14 2332 11/27/14 0357 11/27/14 0414  BP: 93/56 90/59    Pulse: 117 117 118   Temp: 98 F (36.7 C) 98.2 F (36.8 C) 97.4 F (36.3 C)   TempSrc: Oral Oral Oral   Resp:  18 13   Height:      Weight:    205 lb 11 oz (93.3 kg)  SpO2: 98% 100% 99%    Weight change: -2 lb 3.3 oz (-1 kg)  Intake/Output Summary (Last 24 hours) at 11/27/14 1023 Last data filed at 11/27/14 0411  Gross per 24 hour  Intake    692 ml  Output   2752 ml  Net  -2060 ml   General: sitting up in chair, appears tired and depressed HEENT: EOMI, right IJ in place, has conjunctival pallor.  Cardiac: irregular rate, irregular rhythm, mildly tachcardic Pulm: normal work of breathing, clear to auscultation Abd: soft, nontender, nondistended, BS present GU: foley catheter in place Ext: warm and well perfused, 2+ pedal edema Neuro: alert and oriented X3 Rectal exam: dark stool noted, I performed FOBT and developed it myself which was positive.  no mass felt. Normal prostate on exam.  Lab Results: Basic Metabolic Panel:  Recent Labs Lab 11/25/14 0500 11/26/14 0316 11/27/14 0524  NA 128* 127* 124*  K 3.7 4.7 5.5*  CL 91* 88* 88*  CO2 30 32 25  GLUCOSE 208* 194* 280*  BUN 32* 49* 78*  CREATININE 1.40* 1.25* 1.84*  CALCIUM 7.9* 8.0* 7.9*  MG 1.5*  --   --    CBC:  Recent Labs Lab 11/23/14 0328 11/24/14 0455 11/27/14 0740  WBC 10.4 11.8* 19.0*  NEUTROABS 8.0* 8.6*  --   HGB 10.0* 9.6* 6.5*  HCT 35.7* 33.1* 22.3*  MCV 69.3* 66.9* 66.2*  PLT 339 316 320   Cardiac Enzymes:  Recent Labs Lab 11/23/14 0328 11/23/14 1058 11/23/14 1605  TROPONINI 0.06* 0.05* 0.05*   CBG:  Recent Labs Lab  11/25/14 2118 11/26/14 0751 11/26/14 1200 11/26/14 1622 11/26/14 2106 11/27/14 0737  GLUCAP 187* 206* 238* 231* 247* 267*    Medications: Scheduled Meds: . sodium chloride   Intravenous Once  . amiodarone  200 mg Oral BID  . atorvastatin  40 mg Oral q1800  . insulin aspart  0-20 Units Subcutaneous TID WC  . insulin aspart  0-5 Units Subcutaneous QHS  . insulin aspart  3 Units Subcutaneous TID WC  . insulin glargine  10 Units Subcutaneous BID  . pantoprazole (PROTONIX) IV  40 mg Intravenous Q12H  . sodium chloride  3 mL Intravenous Q12H  . spironolactone  12.5 mg Oral Daily  . torsemide  20 mg Oral BID   Continuous Infusions: . milrinone 0.125 mcg/kg/min (11/27/14 0100)  . norepinephrine (LEVOPHED) Adult infusion Stopped (11/25/14 1043)   PRN Meds:.albuterol, ALPRAZolam, ondansetron **OR** ondansetron (ZOFRAN) IV, oxyCODONE Assessment/Plan: Active Problems:   Essential hypertension   CAD- s/p CABG July 2014 [facility]   DM (diabetes mellitus), type 2 with peripheral vascular complications   Anemia of chronic disease   Atrial fibrillation with rapid ventricular response   Homelessness   Acute on chronic heart failure   Atrial fibrillation,  unspecified   Acute on chronic combined systolic and diastolic CHF (congestive heart failure)   Acute on chronic systolic congestive heart failure   Acute respiratory distress   Cardiogenic shock  59 yo male with systolic CHF, DM II, HTN, CAD s/p CABG, afib on anticoag, here with CHF exacerbation.  GI bleed - likely upper GI bleed given melena and BUN rise. Having no pain or other symptoms. No hx of GI issues in the past. Is on xarelto for anticoag so at risk of bleeding from that. Also could be stress 2/2 to being in CCU last few days. No previous cscope or GI evals.  -  I performed rectal exam and saw dark stool, FOBT+ was positive that I performed.  - discussed with GI, they will see the patient. Recommended clear diet for now.  Can't do scope as he was just on xarelto unless he becomes unstable.  - discussed with Dr. Gala Romney, ok to stop anticoag given GI bleeding. Will stop xarelto. - 2 unit of pRBC, follow CBC's closely, protonix IV BID, clear liquid diet  Cardiogenic shock from Acute on chronic systolic CHF - ischemic cardiomyopathy -heart failure team following and appreciate their recommendations. -milrinone titrated down to 0.125, norepi stopped -CO-OX 39 today (down from 47 yesterday). repeat ECHO this admission with EF of 30-35%, improved from previous studies. He feels better but his numbers are not yet. -patient has been transitioned to PO diuretics with continued diursesis - f/up CHF team recs.   Atrial fibrillation with RVR - CHADSVASC2 of 4. -amiodarone 60mg /hr for rate control - off xarelto due to GI bleed -hold beta blockers with cardiogenic shock  Diabetes mellitus type II -glucose has been elevated in the 200s -sliding scale insulin + meal time + qhs coverage.  -Lantus bid  CAD s/p CABG  - holding asa for now b/c of GI bleed  Hyperlipidemia -Lipitor 40 mg daily  Homelessness - appreciate CHF team's effort in arranging housing for him.  Dispo: Disposition is deferred at this time, awaiting improvement of current medical problems.  Consult palliative care for goals of care as pt not a candidate for advanced therapies given homelessness and limited social support.  The patient does have a current PCP ([name], MD) and does not need an [facility] hospital follow-up appointment after discharge.  The patient does have transportation limitations that hinder transportation to clinic appointments.    LOS: 4 days   Hyacinth Meeker, MD 11/27/2014, 10:23 AM

## 2014-11-27 NOTE — Progress Notes (Signed)
Advanced Heart Failure Rounding Note   Subjective:    Transferred to ICU on 8/31 --> cardiogenic shock. He was also back in A fib so he was loaded on amio. Central line placed. Initial CO-OX 44% and CVP 27. Started on norepi due to hypotension and diuresed with IV lasix.   9/1  Milrinone 0.25 mcg, Amio increased to 60 mg per hour,  norepi was weaned off. Brisk diuresis noted.  9/2 Milrinone 0.25 mcg+ Amio 60 mg per hour+ Norepi 5 mcg.   Milrinone cut back to 0.125 on 9/2. Co-ox remained low. Overnight developed GI bleed. Hgb down to 6.1. Co-ox now 37%. SBP 70s. CVP 2-3.  Denies ab pain. Being transfused. Creatine up 1.2->1.8 K 5.5      Objective:   Weight Range:  Vital Signs:   Temp:  [97.4 F (36.3 C)-98.2 F (36.8 C)] 98 F (36.7 C) (09/04 1048) Pulse Rate:  [103-118] 118 (09/04 0357) Resp:  [13-26] 26 (09/04 1048) BP: (86-108)/(40-67) 86/40 mmHg (09/04 1048) SpO2:  [98 %-100 %] 99 % (09/04 0357) Weight:  [93.3 kg (205 lb 11 oz)-93.8 kg (206 lb 12.7 oz)] 93.3 kg (205 lb 11 oz) (09/04 0414) Last BM Date: 11/22/14  Weight change: Filed Weights   11/26/14 0445 11/26/14 1843 11/27/14 0414  Weight: 94.8 kg (208 lb 15.9 oz) 93.8 kg (206 lb 12.7 oz) 93.3 kg (205 lb 11 oz)    Intake/Output:   Intake/Output Summary (Last 24 hours) at 11/27/14 1227 Last data filed at 11/27/14 1000  Gross per 24 hour  Intake  771.3 ml  Output   2002 ml  Net -1230.7 ml     Physical Exam: General:  Sitting in chair. Pale uncomfortable  No resp difficulty HEENT: normal.  Neck: supple. RIJ TLC JVP flt Carotids 2+ bilat; no bruits. No lymphadenopathy or thryomegaly appreciated. Cor: PMI nondisplaced. Irregular rate & rhythm. No rubs, or murmurs.+ S3  Lungs: clear Abdomen: soft, nontender, nondistended. No hepatosplenomegaly. No bruits or masses. Good bowel sounds. Extremities: no cyanosis, clubbing, rash, R and LLE np edema Neuro: alert & orientedx3, cranial nerves grossly intact. moves all 4  extremities w/o difficulty. Affect pleasant GU:  foley   Telemetry: A fib 90-low 100s   Labs: Basic Metabolic Panel:  Recent Labs Lab 11/23/14 0328 11/24/14 0455 11/25/14 0500 11/26/14 0316 11/27/14 0524  NA 130* 129* 128* 127* 124*  K 4.6 4.6 3.7 4.7 5.5*  CL 97* 96* 91* 88* 88*  CO2 25 26 30  32 25  GLUCOSE 276* 152* 208* 194* 280*  BUN 26* 37* 32* 49* 78*  CREATININE 1.13 1.59* 1.40* 1.25* 1.84*  CALCIUM 8.6* 8.3* 7.9* 8.0* 7.9*  MG  --   --  1.5*  --   --     Liver Function Tests: No results for input(s): AST, ALT, ALKPHOS, BILITOT, PROT, ALBUMIN in the last 168 hours. No results for input(s): LIPASE, AMYLASE in the last 168 hours. No results for input(s): AMMONIA in the last 168 hours.  CBC:  Recent Labs Lab 11/23/14 0328 11/24/14 0455 11/27/14 0740  WBC 10.4 11.8* 19.0*  NEUTROABS 8.0* 8.6*  --   HGB 10.0* 9.6* 6.5*  HCT 35.7* 33.1* 22.3*  MCV 69.3* 66.9* 66.2*  PLT 339 316 320    Cardiac Enzymes:  Recent Labs Lab 11/23/14 0328 11/23/14 1058 11/23/14 1605  TROPONINI 0.06* 0.05* 0.05*    BNP: BNP (last 3 results)  Recent Labs  10/29/14 1540 11/11/14 1833 11/23/14 0328  BNP 1232.4*  807.3* 866.8*    ProBNP (last 3 results)  Recent Labs  09/06/14 1107 09/20/14 1201 10/04/14 1256  PROBNP 1577.00* 3006.00* 2524.00*      Other results:  Imaging: No results found.   Medications:     Scheduled Medications: . sodium chloride   Intravenous Once  . amiodarone  200 mg Oral BID  . atorvastatin  40 mg Oral q1800  . insulin aspart  0-20 Units Subcutaneous TID WC  . insulin aspart  0-5 Units Subcutaneous QHS  . insulin aspart  3 Units Subcutaneous TID WC  . insulin glargine  10 Units Subcutaneous BID  . pantoprazole (PROTONIX) IV  40 mg Intravenous Q12H  . sodium chloride  3 mL Intravenous Q12H  . torsemide  20 mg Oral BID    Infusions: . milrinone 0.125 mcg/kg/min (11/27/14 0100)  . norepinephrine (LEVOPHED) Adult infusion  Stopped (11/25/14 1043)    PRN Medications: albuterol, ALPRAZolam, ondansetron **OR** ondansetron (ZOFRAN) IV, oxyCODONE   Assessment:  1. Acute on chronic systolic CHF, ICM EF 20-25% with poor RV function, NYHA class IV -> Cardiogenic shock 2. Acute hypoxic respiratory failure 3. Chronic Afib RVR -  4. 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 DM type II 6. Homelessness 7. Hypotension  8. AKI 9. Hyponatremia 10. Hypomagnesium 11. Acute GI bleed with blood loss anemia    Plan/Discussion:    He is in profound cardiogenic/hemorrhagic shock. Now getting 2u RBCs. Will likely need more.  Will give 500 ccNS. Start levophed to keep SBPs > 90. Stop Xarelto. Hold all diuretics. On IV protonix. Follow very closely. Appreciate GI help.  The patient is critically ill with multiple organ systems failure and requires high complexity decision making for assessment and support, frequent evaluation and titration of therapies, application of advanced monitoring technologies and extensive interpretation of multiple databases.   Critical Care Time devoted to patient care services described in this note is 45 Minutes.    Clevester Helzer,MD 12:27 PM Advanced Heart Failure Team Pager [phone] (M-F; 7a - 4p)  Please contact [facility] for night-coverage after hours (4p -7a ) and weekends on [email or web address]

## 2014-11-27 NOTE — Progress Notes (Signed)
Brown port cannot be flushed therefore alteplase is not option.   Tammy Sours

## 2014-11-27 NOTE — Consult Note (Signed)
[name] Consult: 10:01 AM 11/27/2014  LOS: 4 days    Referring Provider: Dr Heide Spark  Primary Care Physician:  [name], MD Primary Gastroenterologist:  Gentry Fitz.   Brother: [name] in [place], [place] [phone]   Reason for Consultation:  Black stools and acute anemia.    HPI: Jacob [name] is a 59 y.o. male. Hx CHF, EF 20 - 25% (as low as 15% in 11/2011) with poor RV function (NYHA class IV), s/p ICD, CAD, 2014 CABG.  Chronic a fib on Xarelto and 81 ASA.  CHADS-VASC score 4. ASPVD, s/p iliac stent.  Homeless (kicked out of his home by wife and kids)  and medical non-compliance.  Iron def anemia 11/2012 (Hgb 9.5), on chronic by mouth iron; never required transfusions.  No hx GIB or endoscopies.   Admission 8/19 - 8/22 with CHF flare and afib with RVR.  Ruled out for MI. Discharged off Diltiazem and on increased dose Metoprolol,   Admitted 4 days ago with acute on chronic CHF, hypoxic resp failure, cardiogenic shock, AKI.  Although improved he remains on milrinone, amiodarone infusions.  Levophed discontinued as of 9/2  Hgb has dropped from 10.5 baseline, to 9.6 0n 11/24/14 to 6.5 today 3 days later.  Platelets ok. WBCs from normal at admit to 19 today.  cxr with right pleural effusion, vascular congestion.  Has diuresed nerly 7 litres, dropped 10 # as of 9/3.   Patient started noticing dark and looser stools yesterday.  His GI ROS is negative for anorexia, nausea, abdominal pain. His usual bowel habits are brown stools daily or every other day. No previous colonoscopy or upper endoscopy. No history of liver disease though on 04/2013 ultrasound fatty liver and minimal ascites were noted.  No hx peptic ulcer disease.  Hepatitis acute serologies obtained 04/2013 were negative for hep B, C, A. He is also  tested negative for HIV as recently as 07/2014.  Has never drunk alcohol, nor abuse illicit drugs. Doesn't suffer from reflux sxs.  Does not take acid controlling medication at home.  Every 12 hour IV Protonix has been initiated and diet regressed to clear liquids. Cardiology will be holding as well as toe but using Intron IV heparin.    Past Medical History  Diagnosis Date  . Hypertension   . Systolic heart failure     EF is 40-45% by echo, December 2013  . Noncompliance   . CAD (coronary artery disease) Sept 2013    s/p cardiac cath showing occlusion of small RCA with collaterals  . Chronic anticoagulation     on coumadin  . Atrial fibrillation   . Peripheral arterial disease   . Automatic implantable cardioverter-defibrillator in situ   . High cholesterol   . Myocardial infarction 2014  . Type II diabetes mellitus   . CHF (congestive heart failure)   . Shortness of breath dyspnea     Past Surgical History  Procedure Laterality Date  . Implantable cardioverter defibrillator implant      [place] in 07/2012; [employer]  . Coronary  artery bypass graft  09/2012    2 vessels per patient [name]   . Coronary angioplasty with stent placement  11/2011    "1"  . Cardiac catheterization  09/2012  . Iliac artery stent Right 08/30/2013  . Left and right heart catheterization with coronary angiogram N/A 12/02/2011    Procedure: LEFT AND RIGHT HEART CATHETERIZATION WITH CORONARY ANGIOGRAM;  Surgeon: Kathleene Hazel, MD;  Location: [facility] CATH LAB;  Service: Cardiovascular;  Laterality: N/A;  . Lower extremity angiogram N/A 08/30/2013    Procedure: LOWER EXTREMITY ANGIOGRAM;  Surgeon: Runell Gess, MD;  Location: [facility] CATH LAB;  Service: Cardiovascular;  Laterality: N/A;  . Lower extremity angiogram N/A 12/02/2013    Procedure: LOWER EXTREMITY ANGIOGRAM;  Surgeon: Runell Gess, MD;  Location: [facility] CATH LAB;  Service: Cardiovascular;  Laterality: N/A;    Prior to Admission medications    Medication Sig Start Date End Date Taking? Authorizing Provider  albuterol (PROVENTIL HFA;VENTOLIN HFA) 108 (90 BASE) MCG/ACT inhaler Inhale 2 puffs into the lungs every 6 (six) hours as needed for wheezing or shortness of breath. 09/30/14  Yes [name], MD  albuterol (PROVENTIL) (2.5 MG/3ML) 0.083% nebulizer solution Take 3 mLs (2.5 mg total) by nebulization every 6 (six) hours as needed for wheezing or shortness of breath. 09/06/14  Yes Jaclyn Shaggy, MD  aspirin EC 81 MG EC tablet Take 1 tablet (81 mg total) by mouth daily. 10/13/14  Yes Courtney Paris, MD  atorvastatin (LIPITOR) 40 MG tablet Take 1 tablet (40 mg total) by mouth daily at 6 PM. 10/13/14  Yes Courtney Paris, MD  docusate sodium (COLACE) 100 MG capsule Take 1 capsule (100 mg total) by mouth daily. 10/13/14  Yes Courtney Paris, MD  ferrous sulfate 325 (65 FE) MG tablet Take 1 tablet (325 mg total) by mouth 3 (three) times daily with meals. 10/13/14  Yes Courtney Paris, MD  furosemide (LASIX) 40 MG tablet 3 tabs (120mg ) in the morning and 2 tabs (80mg ) in the evening 10/04/14  Yes Jaclyn Shaggy, MD  glipiZIDE (GLUCOTROL) 10 MG tablet Take 1 tablet (10 mg total) by mouth 2 (two) times daily before a meal. 11/02/14  Yes Jaclyn Shaggy, MD  lisinopril (PRINIVIL,ZESTRIL) 2.5 MG tablet Take 1 tablet (2.5 mg total) by mouth daily. 11/02/14  Yes Jaclyn Shaggy, MD  metoprolol (LOPRESSOR) 100 MG tablet Take 1 tablet (100 mg total) by mouth 2 (two) times daily. 11/14/14  Yes Darreld Mclean, MD  rivaroxaban (XARELTO) 20 MG TABS tablet Take 1 tablet (20 mg total) by mouth daily before supper. 10/13/14  Yes Courtney Paris, MD  spironolactone (ALDACTONE) 25 MG tablet Take 1 tablet (25 mg total) by mouth daily. 10/19/14  Yes Gaylord Shih, MD  traMADol (ULTRAM) 50 MG tablet Take 1 tablet (50 mg total) by mouth every 8 (eight) hours as needed for moderate pain. 10/19/14  Yes Jaclyn Shaggy, MD    Scheduled Meds: . sodium chloride   Intravenous Once  . amiodarone  200  mg Oral BID  . aspirin EC  81 mg Oral Daily  . atorvastatin  40 mg Oral q1800  . insulin aspart  0-20 Units Subcutaneous TID WC  . insulin aspart  0-5 Units Subcutaneous QHS  . insulin aspart  3 Units Subcutaneous TID WC  . insulin glargine  10 Units Subcutaneous BID  . rivaroxaban  20 mg Oral QAC supper  . sodium chloride  3 mL Intravenous Q12H  .  spironolactone  12.5 mg Oral Daily  . torsemide  20 mg Oral BID   Infusions: . milrinone 0.125 mcg/kg/min (11/27/14 0100)  . norepinephrine (LEVOPHED) Adult infusion Stopped (11/25/14 1043)   PRN Meds: albuterol, ALPRAZolam, ondansetron **OR** ondansetron (ZOFRAN) IV, oxyCODONE   Allergies as of 11/23/2014 - Review Complete 11/23/2014  Allergen Reaction Noted  . Tape Itching 11/11/2014    Family History  Problem Relation Age of Onset  . Diabetes Mother     Social History   Social History  . Marital Status: Divorced    Spouse Name: N/A  . Number of Children: 3  . Years of Education: N/A   Occupational History  . Unemployed    Social History Main Topics  . Smoking status: Current Every Day Smoker -- 0.50 packs/day for 38 years    Types: Cigarettes  . Smokeless tobacco: Never Used  . Alcohol Use: No  . Drug Use: No  . Sexual Activity: Yes   Other Topics Concern  . Not on file   Social History Narrative   Has an apartment with a roommate. He was living on the streets in 01/01/13.  He reports that his father died in [place] in Jan 01, 2013.  He is divorced.  He is no longer estranged from his son, but still from his daughter who lives locally.  Neither of his parents, nor any siblings have any history of CAD.    REVIEW OF SYSTEMS: Constitutional:  Weight is down 10 pounds due to diuresis. Patient denies dizziness, feels weak and tired however. ENT:  No nose bleeds Pulm:  Breathing has significantly improved. CV:  No palpitations, no LE edema.  GU:  No hematuria, no frequency GI:  Pr HPI  Heme:  Per HPI     Transfusions:  No previous transfusions,  Neuro:  No headaches, no peripheral tingling or numbness Derm:  No itching, no rash or sores.  Endocrine:  No sweats or chills.  No polyuria or dysuria Immunization:  Not queried.  Reviewed immunization history and he received pneumococcal vaccine February, 2015. Travel:  None beyond local counties in last few months.    PHYSICAL EXAM: Vital signs in last 24 hours: Filed Vitals:   11/27/14 0357  BP:   Pulse: 118  Temp: 97.4 F (36.3 C)  Resp: 13   Wt Readings from Last 3 Encounters:  11/27/14 205 lb 11 oz (93.3 kg)  11/14/14 212 lb 12.8 oz (96.525 kg)  11/02/14 216 lb 9.6 oz (98.249 kg)   General: Although he looks somewhat ill, he looks a lot better than expected given his multiple medical issues. Head:  Facial edema, no asymmetry. No signs of head trauma.  Eyes:  No scleral icterus, + conjunctival pallor. Ears:  No obvious hearing deficit  Nose:  No congestion or nasal discharge. Mouth:  Moist, nearly edentulous with about 4 teeth remaining. Slight right exudate on the tongue. No sores. Neck:  No JVD or mass.  Lungs:  Clear bilaterally. No dyspnea, no cough. Heart: Irregular, irregular rhythm with mild tachycardia. Abdomen:  Soft, NT, ND.  No hernias, masses or HSM.   Rectal: deferred.  Black 3+ FOB+ per residents DRE   Musc/Skeltl: no joint redness swelling or contracture Extremities:  No CCE  Neurologic:  Alert. Oriented 3. Moves all 4 limbs, strength not tested but no gross weakness. No tremor. Skin:  No telangiectasia, sores or rashes. Tattoos:  None seen Nodes:  No cervical adenopathy   Psych:  Cooperative, calm.  Intake/Output from previous day: 09/03 0701 - 09/04 0700 In: 1163 [P.O.:830; I.V.:333] Out: 3352 [Urine:3350; Stool:2] Intake/Output this shift:    LAB RESULTS:  Recent Labs  11/27/14 0740  WBC 19.0*  HGB 6.5*  HCT 22.3*  PLT 320   BMET Lab Results  Component Value Date   NA 124* 11/27/2014    NA 127* 11/26/2014   NA 128* 11/25/2014   K 5.5* 11/27/2014   K 4.7 11/26/2014   K 3.7 11/25/2014   CL 88* 11/27/2014   CL 88* 11/26/2014   CL 91* 11/25/2014   CO2 25 11/27/2014   CO2 32 11/26/2014   CO2 30 11/25/2014   GLUCOSE 280* 11/27/2014   GLUCOSE 194* 11/26/2014   GLUCOSE 208* 11/25/2014   BUN 78* 11/27/2014   BUN 49* 11/26/2014   BUN 32* 11/25/2014   CREATININE 1.84* 11/27/2014   CREATININE 1.25* 11/26/2014   CREATININE 1.40* 11/25/2014   CALCIUM 7.9* 11/27/2014   CALCIUM 8.0* 11/26/2014   CALCIUM 7.9* 11/25/2014   LFT No results for input(s): PROT, ALBUMIN, AST, ALT, ALKPHOS, BILITOT, BILIDIR, IBILI in the last 72 hours. PT/INR Lab Results  Component Value Date   INR 1.51* 10/29/2014   INR 1.81* 10/11/2014   INR 1.49 09/28/2014   Lipase     Component Value Date/Time   LIPASE 27 11/11/2014 1825    Drugs of Abuse     Component Value Date/Time   LABOPIA POSITIVE* 11/12/2014 0135   COCAINSCRNUR NONE DETECTED 11/12/2014 0135   LABBENZ NONE DETECTED 11/12/2014 0135   AMPHETMU NONE DETECTED 11/12/2014 0135   THCU NONE DETECTED 11/12/2014 0135   LABBARB NONE DETECTED 11/12/2014 0135     RADIOLOGY STUDIES: No results found.  ENDOSCOPIC STUDIES: none  IMPRESSION:   *  GI bleed.    *  Acute on chronic anemia. Consistently microcytic dating back to 2014 despite his apparently taking oral iron TID.  *  Afib, RVR.  On chronic xarelto. Per Dr Gala Romney "given duration of AF and size of LA on echo (5.4cm) doubt DC-CV is a durable option"  *  Advanced CHF.  Social situation will not support use of home inotropes of mechanical support.   *  Type 2 DM.  No insulin at home.     PLAN:     *  EGD, after off Xarelto for 24 hours (last dose at 1700 yesterday), so likely tomorrow.  Orders placed in [place]. Procedure discussed with patient and he is agreeable to proceed.  *  Assuming that IV heparin has been initiated by tomorrow, this will need to be held for  at least 3, but ideally 5, hours prior to EGD  *  Clear diet, NPO after midnight.    Jennye Moccasin  11/27/2014, 10:01 AM Pager: [phone]

## 2014-11-28 ENCOUNTER — Encounter (HOSPITAL_COMMUNITY)
Admission: EM | Disposition: A | Discharge status: Home / Self Care | Payer: Self-pay | Source: Home / Self Care | Attending: Internal Medicine

## 2014-11-28 ENCOUNTER — Encounter (HOSPITAL_COMMUNITY): Payer: Self-pay | Admitting: *Deleted

## 2014-11-28 DIAGNOSIS — K264 Chronic or unspecified duodenal ulcer with hemorrhage: Secondary | ICD-10-CM

## 2014-11-28 HISTORY — PX: ESOPHAGOGASTRODUODENOSCOPY: SHX5428

## 2014-11-28 LAB — PREPARE RBC (CROSSMATCH)

## 2014-11-28 LAB — BASIC METABOLIC PANEL
Anion gap: 8 (ref 5–15)
BUN: 70 mg/dL — AB (ref 6–20)
CO2: 26 mmol/L (ref 22–32)
CREATININE: 1.48 mg/dL — AB (ref 0.61–1.24)
Calcium: 8.5 mg/dL — ABNORMAL LOW (ref 8.9–10.3)
Chloride: 94 mmol/L — ABNORMAL LOW (ref 101–111)
GFR calc Af Amer: 59 mL/min — ABNORMAL LOW (ref >60)
GFR, EST NON AFRICAN AMERICAN: 51 mL/min — AB (ref >60)
GLUCOSE: 149 mg/dL — AB (ref 65–99)
POTASSIUM: 4.7 mmol/L (ref 3.5–5.1)
SODIUM: 128 mmol/L — AB (ref 135–145)

## 2014-11-28 LAB — GLUCOSE, CAPILLARY
GLUCOSE-CAPILLARY: 150 mg/dL — AB (ref 65–99)
Glucose-Capillary: 206 mg/dL — ABNORMAL HIGH (ref 65–99)
Glucose-Capillary: 169 mg/dL — ABNORMAL HIGH (ref 65–99)
Glucose-Capillary: 143 mg/dL — ABNORMAL HIGH (ref 65–99)

## 2014-11-28 LAB — CBC
HCT: 24.6 % — ABNORMAL LOW (ref 39.0–52.0)
HCT: 24.8 % — ABNORMAL LOW (ref 39.0–52.0)
HEMOGLOBIN: 7.8 g/dL — AB (ref 13.0–17.0)
Hemoglobin: 7.7 g/dL — ABNORMAL LOW (ref 13.0–17.0)
MCH: 22.4 pg — AB (ref 26.0–34.0)
MCH: 23.1 pg — ABNORMAL LOW (ref 26.0–34.0)
MCHC: 31.3 g/dL (ref 30.0–36.0)
MCHC: 31.5 g/dL (ref 30.0–36.0)
MCV: 71.5 fL — AB (ref 78.0–100.0)
MCV: 73.4 fL — ABNORMAL LOW (ref 78.0–100.0)
PLATELETS: 304 10*3/uL (ref 150–400)
PLATELETS: 238 10*3/uL (ref 150–400)
RBC: 3.44 MIL/uL — ABNORMAL LOW (ref 4.22–5.81)
RBC: 3.38 MIL/uL — AB (ref 4.22–5.81)
RDW: 23.3 % — AB (ref 11.5–15.5)
RDW: 22.1 % — ABNORMAL HIGH (ref 11.5–15.5)
WBC: 18.5 10*3/uL — ABNORMAL HIGH (ref 4.0–10.5)
WBC: 14.8 10*3/uL — AB (ref 4.0–10.5)

## 2014-11-28 LAB — CARBOXYHEMOGLOBIN
CARBOXYHEMOGLOBIN: 2.7 % — AB (ref 0.5–1.5)
METHEMOGLOBIN: 1 % (ref 0.0–1.5)
O2 SAT: 57.4 %
TOTAL HEMOGLOBIN: 7.3 g/dL — AB (ref 13.5–18.0)

## 2014-11-28 LAB — HEMOGLOBIN AND HEMATOCRIT, BLOOD
HCT: 25.2 % — ABNORMAL LOW (ref 39.0–52.0)
Hemoglobin: 8 g/dL — ABNORMAL LOW (ref 13.0–17.0)

## 2014-11-28 SURGERY — EGD (ESOPHAGOGASTRODUODENOSCOPY)
Anesthesia: Moderate Sedation

## 2014-11-28 MED ORDER — SODIUM CHLORIDE 0.9 % IV SOLN
8.0000 mg/h | INTRAVENOUS | Status: DC
  Administered 2014-11-28 – 2014-11-30 (×3): 8 mg/h via INTRAVENOUS
  Filled 2014-11-28 (×10): qty 80

## 2014-11-28 MED ORDER — PANTOPRAZOLE SODIUM 40 MG IV SOLR: 40.0000 mg | Freq: Two times a day (BID) | INTRAVENOUS | Status: DC

## 2014-11-28 MED ORDER — FENTANYL CITRATE (PF) 100 MCG/2ML IJ SOLN
INTRAMUSCULAR | Status: DC | PRN
  Administered 2014-11-28: 25 ug via INTRAVENOUS

## 2014-11-28 MED ORDER — BUTAMBEN-TETRACAINE-BENZOCAINE 2-2-14 % EX AERO
INHALATION_SPRAY | CUTANEOUS | Status: DC | PRN
  Administered 2014-11-28: 2 via TOPICAL

## 2014-11-28 MED ORDER — EPINEPHRINE HCL 0.1 MG/ML IJ SOSY
PREFILLED_SYRINGE | INTRAMUSCULAR | Status: AC
  Filled 2014-11-28: qty 10

## 2014-11-28 MED ORDER — SODIUM CHLORIDE 0.9 % IV SOLN
Freq: Once | INTRAVENOUS | Status: AC
  Administered 2014-11-29: via INTRAVENOUS

## 2014-11-28 MED ORDER — SODIUM CHLORIDE 0.9 % IV SOLN: Freq: Once | INTRAVENOUS | Status: DC

## 2014-11-28 MED ORDER — FENTANYL CITRATE (PF) 100 MCG/2ML IJ SOLN
INTRAMUSCULAR | Status: AC
  Filled 2014-11-28: qty 2

## 2014-11-28 MED ORDER — SODIUM CHLORIDE 0.9 % IJ SOLN
PREFILLED_SYRINGE | INTRAMUSCULAR | Status: DC | PRN
  Administered 2014-11-28: 5 mL

## 2014-11-28 MED ORDER — DIPHENHYDRAMINE HCL 50 MG/ML IJ SOLN
INTRAMUSCULAR | Status: AC
  Filled 2014-11-28: qty 1

## 2014-11-28 MED ORDER — MIDAZOLAM HCL 5 MG/ML IJ SOLN
INTRAMUSCULAR | Status: AC
  Filled 2014-11-28: qty 2

## 2014-11-28 MED ORDER — SODIUM CHLORIDE 0.9 % IV SOLN: INTRAVENOUS | Status: DC

## 2014-11-28 MED ORDER — MIDAZOLAM HCL 10 MG/2ML IJ SOLN
INTRAMUSCULAR | Status: DC | PRN
  Administered 2014-11-28: 1 mg via INTRAVENOUS
  Administered 2014-11-28: 2 mg via INTRAVENOUS
  Administered 2014-11-28: 1 mg via INTRAVENOUS

## 2014-11-28 NOTE — Op Note (Signed)
[facility] [address] [place], [address]   ENDOSCOPY PROCEDURE REPORT  PATIENT: [name], [name]  MR#: [id number] BIRTHDATE: Feb 28, 1956 , 58  yrs. old GENDER: male ENDOSCOPIST: Beverley Fiedler, MD REFERRED BY:  Dolores Patty, M.D. PROCEDURE DATE:  11/28/2014 PROCEDURE:  EGD w/ control of bleeding ASA CLASS:     Class IV INDICATIONS:  melena and acute post hemorrhagic anemia. MEDICATIONS: Fentanyl 25 mcg IV and Versed 4 mg IV TOPICAL ANESTHETIC: Cetacaine Spray  DESCRIPTION OF PROCEDURE: After the risks benefits and alternatives of the procedure were thoroughly explained, informed consent was obtained.  The Pentax Gastroscope [id number] endoscope was introduced through the mouth and advanced to the second portion of the duodenum , Without limitations.  The instrument was slowly withdrawn as the mucosa was fully examined.   ESOPHAGUS: The mucosa of the esophagus appeared normal.   No esophageal varices  STOMACH: Fresh blood was seen on entering the stomach but after irrigation lavage, the mucosa of the stomach appeared normal.  DUODENUM: A single bleeding and round ulcer measuring 15 x 83mm in size with active oozing of blood and an adherent clot was found in the duodenal bulb.  Submucosal injection of 46ml of epinephrine 1:10,000 was performed around the bleeding site with slower bleeding resulting.  Complete hemostasis was achieved by placing a single [place] Resolution hemoclip on the bleeding site.   The duodenal mucosa showed fresh blood, but no visible abnormalities in the 2nd part of the duodenum.  Retroflexed views revealed no abnormalities.     The scope was then withdrawn from the patient and the procedure completed.  COMPLICATIONS: There were no immediate complications.    ENDOSCOPIC IMPRESSION: 1.   The mucosa of the esophagus appeared normal 2.   The mucosa of the stomach appeared normal after irrigation and lavage 3.   Bleeding  duodenal ulcer with large visible clot, treated with epinephrine injection and Hemoclip placement 4.   The duodenal mucosa showed no other abnormalities in the 2nd part of the duodenum  RECOMMENDATIONS: 1.  Begin PPI infusion for 72 hours after endoscopic therapy for active duodenal ulcer bleeding 2.  Closely monitor hemoglobin, transfuse as necessary 3.  No NSAIDs 4.  Would hold anticoagulation for now given actively bleeding duodenal ulcer 5.  Check H.  pylori serum antibody and treat on discharge if positive   eSigned:  Beverley Fiedler, MD 11/28/2014 1:26 PM     PATIENT NAME:  Jacob [name], Jacob [name] MR#: [id number]

## 2014-11-28 NOTE — Progress Notes (Signed)
Advanced Heart Failure Rounding Note   Subjective:    Transferred to ICU on 8/31 --> cardiogenic shock. He was also back in A fib so he was loaded on amio. Central line placed. Initial CO-OX 44% and CVP 27. Started on norepi due to hypotension and diuresed with IV lasix.   9/1  Milrinone 0.25 mcg, Amio increased to 60 mg per hour,  norepi was weaned off. Brisk diuresis noted.  9/2 Milrinone 0.25 mcg+ Amio 60 mg per hour+ Norepi 5 mcg.  9/4 Developed GIB. Received 3 units RBCs. Xarelto stopped  Received 3u RBCs yesterday for GIB. Hgb now 7.7. Xarelto stopped. No evidence of ongoing bleed. Remains on milrinone 0.125 and levophed 6 mcg. Renal function fortunately stable. No co-ox this am yet as PICC port clotted. CVP . Feels better. No further melena.      Objective:   Weight Range:  Vital Signs:   Temp:  [97.6 F (36.4 C)-98.7 F (37.1 C)] 98.6 F (37 C) (09/05 0820) Pulse Rate:  [89-111] 89 (09/05 0421) Resp:  [11-26] 23 (09/05 0820) BP: (71-148)/(32-87) 148/68 mmHg (09/05 0820) SpO2:  [98 %-100 %] 98 % (09/05 0820) Weight:  [92.942 kg (204 lb 14.4 oz)] 92.942 kg (204 lb 14.4 oz) (09/05 0421) Last BM Date: 11/22/14  Weight change: Filed Weights   11/26/14 1843 11/27/14 0414 11/28/14 0421  Weight: 93.8 kg (206 lb 12.7 oz) 93.3 kg (205 lb 11 oz) 92.942 kg (204 lb 14.4 oz)    Intake/Output:   Intake/Output Summary (Last 24 hours) at 11/28/14 0958 Last data filed at 11/28/14 0900  Gross per 24 hour  Intake 2704.64 ml  Output   3400 ml  Net -695.36 ml     Physical Exam: General:  Sitting in chair.   No resp difficulty HEENT: normal.  Neck: supple. RIJ TLC JVP flt Carotids 2+ bilat; no bruits. No lymphadenopathy or thryomegaly appreciated. Cor: PMI nondisplaced. Irregular rate & rhythm. No rubs, or murmurs.+ S3  Lungs: clear Abdomen: soft, nontender, nondistended. No hepatosplenomegaly. No bruits or masses. Good bowel sounds. Extremities: no cyanosis, clubbing, rash, R  and LLE no edema Neuro: alert & orientedx3, cranial nerves grossly intact. moves all 4 extremities w/o difficulty. Affect pleasant GU:  foley   Telemetry: A fib 90-low 100s   Labs: Basic Metabolic Panel:  Recent Labs Lab 11/24/14 0455 11/25/14 0500 11/26/14 0316 11/27/14 0524 11/28/14 0518  NA 129* 128* 127* 124* 128*  K 4.6 3.7 4.7 5.5* 4.7  CL 96* 91* 88* 88* 94*  CO2 26 30 32 25 26  GLUCOSE 152* 208* 194* 280* 149*  BUN 37* 32* 49* 78* 70*  CREATININE 1.59* 1.40* 1.25* 1.84* 1.48*  CALCIUM 8.3* 7.9* 8.0* 7.9* 8.5*  MG  --  1.5*  --   --   --     Liver Function Tests: No results for input(s): AST, ALT, ALKPHOS, BILITOT, PROT, ALBUMIN in the last 168 hours. No results for input(s): LIPASE, AMYLASE in the last 168 hours. No results for input(s): AMMONIA in the last 168 hours.  CBC:  Recent Labs Lab 11/23/14 0328 11/24/14 0455 11/27/14 0740 11/27/14 1644 11/27/14 2139 11/28/14 0518  WBC 10.4 11.8* 19.0* 18.3* 18.3* 18.5*  NEUTROABS 8.0* 8.6*  --   --   --   --   HGB 10.0* 9.6* 6.5* 7.1* 6.8* 7.7*  HCT 35.7* 33.1* 22.3* 23.2* 22.4* 24.6*  MCV 69.3* 66.9* 66.2* 70.1* 69.6* 71.5*  PLT 339 316 320 312 303 304  Cardiac Enzymes:  Recent Labs Lab 11/23/14 0328 11/23/14 1058 11/23/14 1605  TROPONINI 0.06* 0.05* 0.05*    BNP: BNP (last 3 results)  Recent Labs  10/29/14 1540 11/11/14 1833 11/23/14 0328  BNP 1232.4* 807.3* 866.8*    ProBNP (last 3 results)  Recent Labs  09/06/14 1107 09/20/14 1201 10/04/14 1256  PROBNP 1577.00* 3006.00* 2524.00*      Other results:  Imaging: No results found.   Medications:     Scheduled Medications: . sodium chloride   Intravenous Once  . sodium chloride   Intravenous Once  . alteplase  2 mg Intracatheter Once  . amiodarone  200 mg Oral BID  . insulin aspart  0-20 Units Subcutaneous TID WC  . insulin aspart  0-5 Units Subcutaneous QHS  . insulin aspart  3 Units Subcutaneous TID WC  . insulin  glargine  10 Units Subcutaneous BID  . pantoprazole (PROTONIX) IV  40 mg Intravenous Q12H  . sodium chloride  3 mL Intravenous Q12H    Infusions: . sodium chloride    . milrinone 0.125 mcg/kg/min (11/28/14 0800)  . norepinephrine (LEVOPHED) Adult infusion 6 mcg/min (11/28/14 0816)    PRN Medications: albuterol, ALPRAZolam, ondansetron **OR** ondansetron (ZOFRAN) IV, oxyCODONE   Assessment:   1. Acute on chronic systolic CHF, ICM EF 20-25% with poor RV function, NYHA class IV -> Cardiogenic shock 2. Acute hypoxic respiratory failure 3. Chronic Afib RVR -  4. 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 DM type II 6. Homelessness 7. Hypotension  8. AKI 9. Hyponatremia 10. Hypomagnesium 11. Acute GI bleed with blood loss anemia    Plan/Discussion:    He is improved but still tenuous on 2 pressors. Will give 1 more unit RBC. For EGD today. Will need to wean inotropes as tolerated. No anticoagulation or diuretics for now.   The patient is critically ill with multiple organ systems failure and requires high complexity decision making for assessment and support, frequent evaluation and titration of therapies, application of advanced monitoring technologies and extensive interpretation of multiple databases.   Critical Care Time devoted to patient care services described in this note is 35 Minutes.    Joseluis Alessio,MD 9:58 AM Advanced Heart Failure Team Pager [phone] (M-F; 7a - 4p)  Please contact [facility] for night-coverage after hours (4p -7a ) and weekends on [email or web address]

## 2014-11-28 NOTE — Progress Notes (Signed)
Subjective: No acute events overnight.  Patient developed likely GI bleed yesterday with Hgb drop from 9.6-6.5.  Received 3 units overnight with rise in Hbg to 7.7.  Xarelto stopped with plan for EGD today.  Today, states he still feels fine.  No chest pain, no shortness of breath, no cough.  Has not noticed any further melena or bright red blood in stool.  Continued on milrinone 0.125 and Levophed per heart failure team.  Objective: Vital signs in last 24 hours: Filed Vitals:   11/28/14 1135 11/28/14 1140 11/28/14 1154 11/28/14 1200  BP: 110/70  148/75 120/88  Pulse: 30 107 110 116  Temp:   97.5 F (36.4 C)   TempSrc:   Oral   Resp: 28 25 28 27   Height:      Weight:      SpO2: 100% 100% 100% 100%   Weight change: -1 lb 14.3 oz (-0.858 kg)  Intake/Output Summary (Last 24 hours) at 11/28/14 1300 Last data filed at 11/28/14 1200  Gross per 24 hour  Intake 1569.24 ml  Output   3500 ml  Net -1930.76 ml   General: sleeping in chair, easily arousable HEENT: EOMI, right IJ in place, + conjunctival pallor Cardiac: irregular rate, irregular rhythm, mildly tachcardic Pulm: normal work of breathing, clear to auscultation Abd: soft, nontender, nondistended, BS present GU: foley catheter in place Ext: warm and well perfused, mild lower extremity edema Neuro: alert and oriented X3  Lab Results: Basic Metabolic Panel:  Recent Labs Lab 11/25/14 0500  11/27/14 0524 11/28/14 0518  NA 128*  < > 124* 128*  K 3.7  < > 5.5* 4.7  CL 91*  < > 88* 94*  CO2 30  < > 25 26  GLUCOSE 208*  < > 280* 149*  BUN 32*  < > 78* 70*  CREATININE 1.40*  < > 1.84* 1.48*  CALCIUM 7.9*  < > 7.9* 8.5*  MG 1.5*  --   --   --   < > = values in this interval not displayed. CBC:  Recent Labs Lab 11/23/14 0328 11/24/14 0455  11/27/14 2139 11/28/14 0518  WBC 10.4 11.8*  < > 18.3* 18.5*  NEUTROABS 8.0* 8.6*  --   --   --   HGB 10.0* 9.6*  < > 6.8* 7.7*  HCT 35.7* 33.1*  < > 22.4* 24.6*  MCV  69.3* 66.9*  < > 69.6* 71.5*  PLT 339 316  < > 303 304  < > = values in this interval not displayed. Cardiac Enzymes:  Recent Labs Lab 11/23/14 0328 11/23/14 1058 11/23/14 1605  TROPONINI 0.06* 0.05* 0.05*   CBG:  Recent Labs Lab 11/26/14 2106 11/27/14 0737 11/27/14 1158 11/27/14 1732 11/27/14 2057 11/28/14 0816  GLUCAP 247* 267* 264* 77 158* 206*    Medications: Scheduled Meds: . sodium chloride   Intravenous Once  . sodium chloride   Intravenous Once  . sodium chloride   Intravenous Once  . alteplase  2 mg Intracatheter Once  . amiodarone  200 mg Oral BID  . insulin aspart  0-20 Units Subcutaneous TID WC  . insulin aspart  0-5 Units Subcutaneous QHS  . insulin aspart  3 Units Subcutaneous TID WC  . insulin glargine  10 Units Subcutaneous BID  . pantoprazole (PROTONIX) IV  40 mg Intravenous Q12H  . sodium chloride  3 mL Intravenous Q12H   Continuous Infusions: . sodium chloride    . milrinone 0.125 mcg/kg/min (11/28/14 0800)  .  norepinephrine (LEVOPHED) Adult infusion 4 mcg/min (11/28/14 1200)   PRN Meds:.albuterol, ALPRAZolam, ondansetron **OR** ondansetron (ZOFRAN) IV, oxyCODONE Assessment/Plan: Active Problems:   Essential hypertension   CAD- s/p CABG July 2014 [facility]   DM (diabetes mellitus), type 2 with peripheral vascular complications   Anemia of chronic disease   Atrial fibrillation with rapid ventricular response   Homelessness   Acute on chronic heart failure   Atrial fibrillation, unspecified   Acute on chronic combined systolic and diastolic CHF (congestive heart failure)   Acute on chronic systolic congestive heart failure   Acute respiratory distress   Cardiogenic shock   GI bleed   Acute blood loss anemia   Hemorrhagic shock  59 yo male with systolic CHF, DM II, HTN, CAD s/p CABG, afib on anticoag, here with CHF exacerbation.  GI bleed - likely upper GI bleed given melena and BUN rise. Having no pain or other symptoms. No history  of GI issues in the past.  Patient on Xarelto which has been stopped.  Could potentially be stress ulcer 2/2 to being in CCU past few days - Rectal exam 11/27/2014 with dark stool and FOBT positive -GI following with plan for endoscopy today.  Will follow up their recommendations -Xarelto stopped -IV protonix  bid -BUN 78 yesterday down to 70 today -plan to receive 1 more unit today.  Will follow up CBC with transfusion threshold of 8.  Cardiogenic shock from Acute on chronic systolic CHF - ischemic cardiomyopathy -heart failure team following and appreciate their recommendations. -milrinone titrated down to 0.125, norepi -CO-OX 57 today. Repeat ECHO this admission with EF of 30-35%, improved from previous studies. -CHF team holding diuretics right now - follow up CHF team recommendations.   Atrial fibrillation with RVR - CHADSVASC2 of 4. -amiodarone /hr for rate control -off xarelto due to GI bleed -hold beta blockers with cardiogenic shock  Diabetes mellitus type II -glucose has been elevated in the 200s -sliding scale insulin + meal time + qhs coverage.  -Lantus bid  CAD s/p CABG  - holding asa for now b/c of GI bleed  Hyperlipidemia -Lipitor 40 mg daily  Homelessness - appreciate CHF team's effort in arranging housing for him.  Dispo: Disposition is deferred at this time, awaiting improvement of current medical problems.  Consult palliative care for goals of care as pt not a candidate for advanced therapies given homelessness and limited social support.  The patient does have a current PCP ([name], MD) and does not need an [facility] hospital follow-up appointment after discharge.  The patient does have transportation limitations that hinder transportation to clinic appointments.    LOS: 5 days   [name], DO 11/28/2014, 1:00 PM

## 2014-11-29 ENCOUNTER — Ambulatory Visit: Payer: Medicaid Other | Admitting: Family Medicine

## 2014-11-29 DIAGNOSIS — I2510 Atherosclerotic heart disease of native coronary artery without angina pectoris: Secondary | ICD-10-CM

## 2014-11-29 DIAGNOSIS — Z9889 Other specified postprocedural states: Secondary | ICD-10-CM

## 2014-11-29 DIAGNOSIS — Z794 Long term (current) use of insulin: Secondary | ICD-10-CM

## 2014-11-29 DIAGNOSIS — Z79899 Other long term (current) drug therapy: Secondary | ICD-10-CM

## 2014-11-29 DIAGNOSIS — Z951 Presence of aortocoronary bypass graft: Secondary | ICD-10-CM

## 2014-11-29 DIAGNOSIS — E785 Hyperlipidemia, unspecified: Secondary | ICD-10-CM

## 2014-11-29 DIAGNOSIS — I255 Ischemic cardiomyopathy: Secondary | ICD-10-CM

## 2014-11-29 DIAGNOSIS — K260 Acute duodenal ulcer with hemorrhage: Secondary | ICD-10-CM

## 2014-11-29 DIAGNOSIS — I5023 Acute on chronic systolic (congestive) heart failure: Secondary | ICD-10-CM

## 2014-11-29 DIAGNOSIS — Z590 Homelessness: Secondary | ICD-10-CM

## 2014-11-29 DIAGNOSIS — E1165 Type 2 diabetes mellitus with hyperglycemia: Secondary | ICD-10-CM

## 2014-11-29 LAB — TYPE AND SCREEN
ABO/RH(D): O POS
Antibody Screen: NEGATIVE
UNIT DIVISION: 0
UNIT DIVISION: 0
UNIT DIVISION: 0
Unit division: 0
Unit division: 0

## 2014-11-29 LAB — BASIC METABOLIC PANEL
ANION GAP: 8 (ref 5–15)
BUN: 46 mg/dL — ABNORMAL HIGH (ref 6–20)
CHLORIDE: 100 mmol/L — AB (ref 101–111)
CO2: 23 mmol/L (ref 22–32)
CREATININE: 1.27 mg/dL — AB (ref 0.61–1.24)
Calcium: 8.6 mg/dL — ABNORMAL LOW (ref 8.9–10.3)
GFR calc non Af Amer: >60 mL/min (ref >60)
Glucose, Bld: 121 mg/dL — ABNORMAL HIGH (ref 65–99)
POTASSIUM: 4.3 mmol/L (ref 3.5–5.1)
SODIUM: 131 mmol/L — AB (ref 135–145)

## 2014-11-29 LAB — CBC
HCT: 26.5 % — ABNORMAL LOW (ref 39.0–52.0)
HCT: 27.9 % — ABNORMAL LOW (ref 39.0–52.0)
HEMOGLOBIN: 8.4 g/dL — AB (ref 13.0–17.0)
HEMOGLOBIN: 8.6 g/dL — AB (ref 13.0–17.0)
MCH: 23.9 pg — AB (ref 26.0–34.0)
MCH: 23.4 pg — ABNORMAL LOW (ref 26.0–34.0)
MCHC: 31.7 g/dL (ref 30.0–36.0)
MCHC: 30.8 g/dL (ref 30.0–36.0)
MCV: 75.3 fL — AB (ref 78.0–100.0)
MCV: 76 fL — ABNORMAL LOW (ref 78.0–100.0)
PLATELETS: 240 10*3/uL (ref 150–400)
PLATELETS: 227 10*3/uL (ref 150–400)
RBC: 3.52 MIL/uL — AB (ref 4.22–5.81)
RBC: 3.67 MIL/uL — AB (ref 4.22–5.81)
RDW: 21.8 % — ABNORMAL HIGH (ref 11.5–15.5)
RDW: 22 % — ABNORMAL HIGH (ref 11.5–15.5)
WBC: 11.7 10*3/uL — AB (ref 4.0–10.5)
WBC: 10.5 10*3/uL (ref 4.0–10.5)

## 2014-11-29 LAB — CARBOXYHEMOGLOBIN
CARBOXYHEMOGLOBIN: 2.2 % — AB (ref 0.5–1.5)
Carboxyhemoglobin: 2.8 % — ABNORMAL HIGH (ref 0.5–1.5)
METHEMOGLOBIN: 0.8 % (ref 0.0–1.5)
Methemoglobin: 1 % (ref 0.0–1.5)
O2 SAT: 69.8 %
O2 SAT: 65.2 %
Total hemoglobin: 8.4 g/dL — ABNORMAL LOW (ref 13.5–18.0)
Total hemoglobin: 8.3 g/dL — ABNORMAL LOW (ref 13.5–18.0)

## 2014-11-29 LAB — GLUCOSE, CAPILLARY
GLUCOSE-CAPILLARY: 167 mg/dL — AB (ref 65–99)
Glucose-Capillary: 139 mg/dL — ABNORMAL HIGH (ref 65–99)
Glucose-Capillary: 175 mg/dL — ABNORMAL HIGH (ref 65–99)

## 2014-11-29 MED ORDER — DIGOXIN 125 MCG PO TABS
0.0625 mg | ORAL_TABLET | Freq: Every day | ORAL | Status: DC
  Administered 2014-11-29 – 2014-12-05 (×7): 0.0625 mg via ORAL
  Filled 2014-11-29 (×7): qty 1

## 2014-11-29 MED ORDER — TORSEMIDE 20 MG PO TABS
60.0000 mg | ORAL_TABLET | Freq: Two times a day (BID) | ORAL | Status: DC
  Administered 2014-11-29 – 2014-11-30 (×2): 60 mg via ORAL
  Filled 2014-11-29 (×2): qty 3

## 2014-11-29 MED ORDER — LISINOPRIL 2.5 MG PO TABS
2.5000 mg | ORAL_TABLET | Freq: Every day | ORAL | Status: DC
  Administered 2014-11-29 – 2014-11-30 (×2): 2.5 mg via ORAL
  Filled 2014-11-29 (×2): qty 1

## 2014-11-29 MED ORDER — POTASSIUM CHLORIDE CRYS ER 20 MEQ PO TBCR
20.0000 meq | EXTENDED_RELEASE_TABLET | Freq: Two times a day (BID) | ORAL | Status: DC
  Administered 2014-11-29 – 2014-12-01 (×5): 20 meq via ORAL
  Filled 2014-11-29 (×3): qty 1
  Filled 2014-11-29: qty 2
  Filled 2014-11-29: qty 1

## 2014-11-29 NOTE — Progress Notes (Signed)
Subjective: No acute events overnight.  Has not had a bowel movement since first noticed melena in stool.  Denies n/v/d, no chest pain, no shortness of breath.  States he feels well and is hungry.  Objective: Vital signs in last 24 hours: Filed Vitals:   11/29/14 0645 11/29/14 0700 11/29/14 0750 11/29/14 1100  BP: 104/55  116/71 130/93  Pulse:   98 107  Temp: 98 F (36.7 C)  97.9 F (36.6 C) 97.8 F (36.6 C)  TempSrc:   Oral Oral  Resp: 18 18 20 21   Height:      Weight:      SpO2:   98% 100%   Weight change: -1 lb 8 oz (-0.68 kg)  Intake/Output Summary (Last 24 hours) at 11/29/14 1324 Last data filed at 11/29/14 1200  Gross per 24 hour  Intake 2191.98 ml  Output   1500 ml  Net 691.98 ml   General: sitting up in chair, no distress HEENT: EOMI, right IJ in place Cardiac: irregular rate, irregular rhythm, mildly tachcardic Pulm: normal work of breathing, clear to auscultation Abd: soft, nontender, nondistended, BS present Ext: warm and well perfused, mild lower extremity edema Neuro: alert and oriented X3  Lab Results: Basic Metabolic Panel:  Recent Labs Lab 11/25/14 0500  11/28/14 0518 11/29/14 0537  NA 128*  < > 128* 131*  K 3.7  < > 4.7 4.3  CL 91*  < > 94* 100*  CO2 30  < > 26 23  GLUCOSE 208*  < > 149* 121*  BUN 32*  < > 70* 46*  CREATININE 1.40*  < > 1.48* 1.27*  CALCIUM 7.9*  < > 8.5* 8.6*  MG 1.5*  --   --   --   < > = values in this interval not displayed. CBC:  Recent Labs Lab 11/23/14 0328 11/24/14 0455  11/28/14 2204 11/29/14 0537  WBC 10.4 11.8*  < > 14.8* 11.7*  NEUTROABS 8.0* 8.6*  --   --   --   HGB 10.0* 9.6*  < > 7.8* 8.4*  HCT 35.7* 33.1*  < > 24.8* 26.5*  MCV 69.3* 66.9*  < > 73.4* 75.3*  PLT 339 316  < > 238 240  < > = values in this interval not displayed. Cardiac Enzymes:  Recent Labs Lab 11/23/14 0328 11/23/14 1058 11/23/14 1605  TROPONINI 0.06* 0.05* 0.05*   CBG:  Recent Labs Lab 11/27/14 2057 11/28/14 0816  11/28/14 1156 11/28/14 1635 11/28/14 2152 11/29/14 0821  GLUCAP 158* 206* 169* 143* 150* 139*    Medications: Scheduled Meds: . sodium chloride   Intravenous Once  . sodium chloride   Intravenous Once  . sodium chloride   Intravenous Once  . alteplase  2 mg Intracatheter Once  . amiodarone  200 mg Oral BID  . digoxin  0.0625 mg Oral Daily  . insulin aspart  0-20 Units Subcutaneous TID WC  . insulin aspart  0-5 Units Subcutaneous QHS  . insulin aspart  3 Units Subcutaneous TID WC  . insulin glargine  10 Units Subcutaneous BID  . lisinopril  2.5 mg Oral Daily  . [START ON 12/02/2014] pantoprazole (PROTONIX) IV  40 mg Intravenous Q12H  . potassium chloride  20 mEq Oral BID  . sodium chloride  3 mL Intravenous Q12H  . torsemide  60 mg Oral BID   Continuous Infusions: . sodium chloride    . pantoprozole (PROTONIX) infusion 8 mg/hr (11/29/14 1307)   PRN Meds:.albuterol, ALPRAZolam, ondansetron **  OR** ondansetron (ZOFRAN) IV, oxyCODONE Assessment/Plan: Active Problems:   Essential hypertension   CAD- s/p CABG July 2014 [facility]   DM (diabetes mellitus), type 2 with peripheral vascular complications   Anemia of chronic disease   Atrial fibrillation with rapid ventricular response   Homelessness   Acute on chronic heart failure   Atrial fibrillation, unspecified   Acute on chronic combined systolic and diastolic CHF (congestive heart failure)   Acute on chronic systolic congestive heart failure   Acute respiratory distress   Cardiogenic shock   GI bleed   Acute blood loss anemia   Hemorrhagic shock   Duodenal ulcer with hemorrhage  59 yo male with systolic CHF, DM II, HTN, CAD s/p CABG, afib on anticoag, here with CHF exacerbation.  GI bleed - EGD yesterday with finding of large, bleeding duodenal ulcer.  Treated with epinephrine and clipping.   -GI following, appreciate their recommendations -Xarelto stopped, will follow their direction on restarting  -IV protonix  infusion x 72 hours with transition to bid dosing -BUN 46 today, down from 70 yesterday -H/H 8.4/26.5 -monitor CBC with transfusion threshold <8  Cardiogenic shock from Acute on chronic systolic CHF - ischemic cardiomyopathy -heart failure team following and appreciate their recommendations. -CO-OX 70 today  Atrial fibrillation with RVR - CHADSVASC2 of 4. -amiodarone for rate control -off xarelto due to GI bleed -hold beta blockers with cardiogenic shock  Diabetes mellitus type II -glucose has been elevated in the 200s -sliding scale insulin + meal time + qhs coverage.  -Lantus bid  CAD s/p CABG  - holding asa for now b/c of GI bleed  Hyperlipidemia -Lipitor 40 mg daily  Homelessness - appreciate CHF team's effort in arranging housing for him.  Dispo: Disposition is deferred at this time, awaiting improvement of current medical problems.  Consult palliative care for goals of care as pt not a candidate for advanced therapies given homelessness and limited social support.  The patient does have a current PCP ([name], MD) and does not need an [facility] hospital follow-up appointment after discharge.  The patient does have transportation limitations that hinder transportation to clinic appointments.    LOS: 6 days   [name], DO 11/29/2014, 1:24 PM

## 2014-11-29 NOTE — Progress Notes (Signed)
Advanced Heart Failure Rounding Note   Subjective:    Transferred to ICU on 8/31 --> cardiogenic shock. He was also back in A fib so he was loaded on amio. Central line placed. Initial CO-OX 44% and CVP 27. Started on norepi due to hypotension and diuresed with IV lasix.   9/1  Milrinone 0.25 mcg, Amio increased to 60 mg per hour,  norepi was weaned off. Brisk diuresis noted.  9/2 Milrinone 0.25 mcg+ Amio 60 mg per hour+ Norepi 5 mcg.  9/4 Developed GIB. Received 3 units RBCs. Xarelto stopped  9/5 EGD- Duodenal Bleed- Clip applied.  Milrinone 0.125 mcg + levo 6 mcg+ amio  9/6- Hgb 8.4 CO-OX 70% On milrinone 0.125 mcg + amio 30 mg per hour.   Denies SOB/Orthopnea. Feels much better. No dyspnea. No further bleeding. CVP 6  CO-OX 70%.    Objective:   Weight Range:  Vital Signs:   Temp:  [97.5 F (36.4 C)-98.1 F (36.7 C)] 97.9 F (36.6 C) (09/06 0750) Pulse Rate:  [30-120] 98 (09/06 0750) Resp:  [8-28] 20 (09/06 0750) BP: (91-155)/(46-94) 116/71 mmHg (09/06 0750) SpO2:  [96 %-100 %] 98 % (09/06 0750) Weight:  [203 lb 6.4 oz (92.262 kg)] 203 lb 6.4 oz (92.262 kg) (09/06 0300) Last BM Date: 11/22/14  Weight change: Filed Weights   11/27/14 0414 11/28/14 0421 11/29/14 0300  Weight: 205 lb 11 oz (93.3 kg) 204 lb 14.4 oz (92.942 kg) 203 lb 6.4 oz (92.262 kg)    Intake/Output:   Intake/Output Summary (Last 24 hours) at 11/29/14 0830 Last data filed at 11/29/14 0800  Gross per 24 hour  Intake 2082.19 ml  Output   1825 ml  Net 257.19 ml     Physical Exam: CVP ~46 General:  Sitting in chair.   No resp difficulty HEENT: normal.  Neck: supple. RIJ TLC JVP flat  Carotids 2+ bilat; no bruits. No lymphadenopathy or thryomegaly appreciated. Cor: PMI nondisplaced. Irregular rate & rhythm. No rubs, or murmurs.+ S3  Lungs: clear Abdomen: soft, nontender, nondistended. No hepatosplenomegaly. No bruits or masses. Good bowel sounds. Extremities: no cyanosis, clubbing, rash, R and LLE  tr-1+ edema Neuro: alert & orientedx3, cranial nerves grossly intact. moves all 4 extremities w/o difficulty. Affect pleasant GU:  foley   Telemetry: A fib 100-110   Labs: Basic Metabolic Panel:  Recent Labs Lab 11/25/14 0500 11/26/14 0316 11/27/14 0524 11/28/14 0518 11/29/14 0537  NA 128* 127* 124* 128* 131*  K 3.7 4.7 5.5* 4.7 4.3  CL 91* 88* 88* 94* 100*  CO2 30 32 GLUCOSE 208* 194* 280* 149* 121*  BUN 32* 49* 78* 70* 46*  CREATININE 1.40* 1.25* 1.84* 1.48* 1.27*  CALCIUM 7.9* 8.0* 7.9* 8.5* 8.6*  MG 1.5*  --   --   --   --     Liver Function Tests: No results for input(s): AST, ALT, ALKPHOS, BILITOT, PROT, ALBUMIN in the last 168 hours. No results for input(s): LIPASE, AMYLASE in the last 168 hours. No results for input(s): AMMONIA in the last 168 hours.  CBC:  Recent Labs Lab 11/23/14 0328 11/24/14 0455  11/27/14 1644 11/27/14 2139 11/28/14 0518 11/28/14 1810 11/28/14 2204 11/29/14 0537  WBC 10.4 11.8*  < > 18.3* 18.3* 18.5*  --  14.8* 11.7*  NEUTROABS 8.0* 8.6*  --   --   --   --   --   --   --   HGB 10.0* 9.6*  < > 7.1*  6.8* 7.7* 8.0* 7.8* 8.4*  HCT 35.7* 33.1*  < > 23.2* 22.4* 24.6* 25.2* 24.8* 26.5*  MCV 69.3* 66.9*  < > 70.1* 69.6* 71.5*  --  73.4* 75.3*  PLT 339 316  < > 312 303 304  --  238 240  < > = values in this interval not displayed.  Cardiac Enzymes:  Recent Labs Lab 11/23/14 0328 11/23/14 1058 11/23/14 1605  TROPONINI 0.06* 0.05* 0.05*    BNP: BNP (last 3 results)  Recent Labs  10/29/14 1540 11/11/14 1833 11/23/14 0328  BNP 1232.4* 807.3* 866.8*    ProBNP (last 3 results)  Recent Labs  09/06/14 1107 09/20/14 1201 10/04/14 1256  PROBNP 1577.00* 3006.00* 2524.00*      Other results:  Imaging: No results found.   Medications:     Scheduled Medications: . sodium chloride   Intravenous Once  . sodium chloride   Intravenous Once  . sodium chloride   Intravenous Once  . alteplase  2 mg  Intracatheter Once  . amiodarone  200 mg Oral BID  . insulin aspart  0-20 Units Subcutaneous TID WC  . insulin aspart  0-5 Units Subcutaneous QHS  . insulin aspart  3 Units Subcutaneous TID WC  . insulin glargine  10 Units Subcutaneous BID  . [START ON 12/02/2014] pantoprazole (PROTONIX) IV  40 mg Intravenous Q12H  . sodium chloride  3 mL Intravenous Q12H    Infusions: . sodium chloride    . milrinone 0.125 mcg/kg/min (11/29/14 0647)  . norepinephrine (LEVOPHED) Adult infusion Stopped (11/29/14 0600)  . pantoprozole (PROTONIX) infusion 8 mg/hr (11/28/14 1537)    PRN Medications: albuterol, ALPRAZolam, ondansetron **OR** ondansetron (ZOFRAN) IV, oxyCODONE   Assessment:   1. Acute on chronic systolic CHF, ICM EF 20-25% with poor RV function, NYHA class IV -> Cardiogenic shock 2. Acute hypoxic respiratory failure 3. Chronic Afib RVR - no anticoagulation with GI bleed.s 4. 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 DM type II 6. Homelessness 7. Hypotension  8. AKI 9. Hyponatremia 10. Hypomagnesium 11. Acute GI bleed with blood loss anemia   Plan/Discussion:    S/P EGD- Duodenal Bleed with clot note. Clip placed. Hgb stable 8.4 today. On PPI.   CO-OX 70%. Stop milrinone. Check CO-OX at 1200. Start dig 0.125 mg daily. CVP 6. Hold off on diuretics. Start lisinopril 2.5 mg daily (home dose). Renal function stable.   Remains on amio drip and transition to 400 mg bid. Hopefully rate will be better controlled off milrinone.  Hold off on Toprol XL for now with low output heart failure. No anticoagulant with GI bleed.    CLEGG,AMY NP-C  8:30 AM Advanced Heart Failure Team Pager [phone] (M-F; 7a - 4p)  Please contact [facility] for night-coverage after hours (4p -7a ) and weekends on [email or web address]  Patient seen and examined with Tonye Becket, NP. We discussed all aspects of the encounter. I agree with the assessment and plan as stated above.   He is improved today. Agree with stopping  milrinone and rechecking co-ox. Start digoxin and lisinopril. CVP 6-7. Will restart po diuretics this afternoon (torsemide 60 bid). Will see how he does and consider adding spiro back tomorrow.  Await GI input regarding timing of re-initiation of Xarelto. Rate control of AF continues to be an issue. Switching amio to po. May eventually need to consider AV node ablation with BiV pacing.   Tessa Seaberry,MD 10:44 AM

## 2014-11-29 NOTE — Progress Notes (Signed)
CARDIAC REHAB PHASE I   PRE:  Rate/Rhythm: 114 afib    BP: sitting 95/72    SaO2: 100 RA  MODE:  Ambulation: 350 ft   POST:  Rate/Rhythm: 120 afib    BP: sitting 130/93     SaO2: 99 RA  Pt able to stand and walk at slow pace. Fairly steady. Tiring toward end of walk. SOB once sitting down. Pt enjoyed walking. Will continue to follow. [id number]   Elissa Lovett [place] CES, ACSM 11/29/2014 11:29 AM

## 2014-11-29 NOTE — Progress Notes (Signed)
Daily Rounding Note  11/29/2014, 9:02 AM  LOS: 6 days   SUBJECTIVE:       No BM yesterday or today.  Feels well, walking in halls without significant dyspnea.   He is hungry on clears.  No nausea or abd pain.   OBJECTIVE:         Vital signs in last 24 hours:    Temp:  [97.5 F (36.4 C)-98.1 F (36.7 C)] 97.9 F (36.6 C) (09/06 0750) Pulse Rate:  [30-120] 98 (09/06 0750) Resp:  [8-28] 20 (09/06 0750) BP: (91-155)/(46-94) 116/71 mmHg (09/06 0750) SpO2:  [96 %-100 %] 98 % (09/06 0750) Weight:  [203 lb 6.4 oz (92.262 kg)] 203 lb 6.4 oz (92.262 kg) (09/06 0300) Last BM Date: 11/22/14 Filed Weights   11/27/14 0414 11/28/14 0421 11/29/14 0300  Weight: 205 lb 11 oz (93.3 kg) 204 lb 14.4 oz (92.942 kg) 203 lb 6.4 oz (92.262 kg)   General: comfortable, alert.  Looks unwell but not acutely ill Heart: RRR Chest: reduced but clear.  Not coughing, not SOB.  Abdomen: soft, NT, ND.  Active BS Extremities: no CCE Neuro/Psych:  Cooperative, oriented x 3.  Slightly anxious.    Recent Labs  11/28/14 0518 11/28/14 1810 11/28/14 2204 11/29/14 0537  WBC 18.5*  --  14.8* 11.7*  HGB 7.7* 8.0* 7.8* 8.4*  HCT 24.6* 25.2* 24.8* 26.5*  PLT 304  --  238 240   BMET  Recent Labs  11/27/14 0524 11/28/14 0518 11/29/14 0537  NA 124* 128* 131*  K 5.5* 4.7 4.3  CL 88* 94* 100*  CO2 25 26 23   GLUCOSE 280* 149* 121*  BUN 78* 70* 46*  CREATININE 1.84* 1.48* 1.27*  CALCIUM 7.9* 8.5* 8.6*   Scheduled Meds: . sodium chloride   Intravenous Once  . sodium chloride   Intravenous Once  . sodium chloride   Intravenous Once  . alteplase  2 mg Intracatheter Once  . amiodarone  200 mg Oral BID  . digoxin  0.0625 mg Oral Daily  . insulin aspart  0-20 Units Subcutaneous TID WC  . insulin aspart  0-5 Units Subcutaneous QHS  . insulin aspart  3 Units Subcutaneous TID WC  . insulin glargine  10 Units Subcutaneous BID  . lisinopril  2.5 mg  Oral Daily  . [START ON 12/02/2014] pantoprazole (PROTONIX) IV  40 mg Intravenous Q12H  . sodium chloride  3 mL Intravenous Q12H   Continuous Infusions: . sodium chloride    . pantoprozole (PROTONIX) infusion 8 mg/hr (11/28/14 1537)   PRN Meds:.albuterol, ALPRAZolam, ondansetron **OR** ondansetron (ZOFRAN) IV, oxyCODONE   ASSESMENT:   *  UGI bleed in setting of Xarelto. 11/28/14 EGD: large, bleeding duodenal ulcer with overlying clot.  Treated with epi and single hemoclip.  On BID, IV Protonix.   *  ABL on top of chronic, microcytic anemia dating back to 04/2013. S/p 5 PRBCs.  Last at 0250 today.   Hgb improved.   *  CHF, cardiogenic shock. Levophed dc'd 9/5. Milrinone dc'd this AM.    *  AKI.  CKD.   *  DM2, on insulin as inpt, oral med as outpt.    *  Homeless, complicates mgt.    PLAN   *  Carb mod/heart healthy diet.   *  AM CBC.  Follow up on serum H pylori Ab and treat if positive.     Jennye Moccasin  11/29/2014, 9:02 AM Pager:  [phone]   ________________________________________________________________________  [facility] MD note:  I personally examined the patient, reviewed the data and agree with the assessment and plan described above.  I discussed the case with Dr. Rhea Belton and reviewed his EGD images as well. The ulcer site is at risk for rebleeding and was treated with epi injection, endoclip placement. I recommend holding blood thinners for another 48 hours.  He should stay on IV PPI for another 24 hours then it is safe to switch to oral PPI twice daily.  He should stay on twice daily PPI for at least 2-3 months and if no overt rebleeding by that point he can probably safely decrease to once daily PPI, indefinitely.  If H. pyori Ag is elevated, will treat with appropriate antibiotics.    Rob Bunting, MD [facility] Pager [phone]

## 2014-11-30 ENCOUNTER — Encounter (HOSPITAL_COMMUNITY): Payer: Self-pay | Admitting: Internal Medicine

## 2014-11-30 DIAGNOSIS — D638 Anemia in other chronic diseases classified elsewhere: Secondary | ICD-10-CM

## 2014-11-30 LAB — CBC
HCT: 29.8 % — ABNORMAL LOW (ref 39.0–52.0)
HCT: 29.1 % — ABNORMAL LOW (ref 39.0–52.0)
Hemoglobin: 9.3 g/dL — ABNORMAL LOW (ref 13.0–17.0)
Hemoglobin: 9 g/dL — ABNORMAL LOW (ref 13.0–17.0)
MCH: 23.8 pg — ABNORMAL LOW (ref 26.0–34.0)
MCH: 23.7 pg — AB (ref 26.0–34.0)
MCHC: 31.2 g/dL (ref 30.0–36.0)
MCHC: 30.9 g/dL (ref 30.0–36.0)
MCV: 76.4 fL — ABNORMAL LOW (ref 78.0–100.0)
MCV: 76.6 fL — AB (ref 78.0–100.0)
PLATELETS: 246 10*3/uL (ref 150–400)
PLATELETS: 251 10*3/uL (ref 150–400)
RBC: 3.9 MIL/uL — AB (ref 4.22–5.81)
RBC: 3.8 MIL/uL — ABNORMAL LOW (ref 4.22–5.81)
RDW: 22.7 % — ABNORMAL HIGH (ref 11.5–15.5)
RDW: 22.9 % — AB (ref 11.5–15.5)
WBC: 12.8 10*3/uL — AB (ref 4.0–10.5)
WBC: 14 10*3/uL — AB (ref 4.0–10.5)

## 2014-11-30 LAB — CARBOXYHEMOGLOBIN
Carboxyhemoglobin: 1.5 % (ref 0.5–1.5)
Carboxyhemoglobin: 2.4 % — ABNORMAL HIGH (ref 0.5–1.5)
Methemoglobin: 0.8 % (ref 0.0–1.5)
Methemoglobin: 0.7 % (ref 0.0–1.5)
O2 SAT: 41.7 %
O2 SAT: 83.3 %
TOTAL HEMOGLOBIN: 9.3 g/dL — AB (ref 13.5–18.0)
Total hemoglobin: 9.4 g/dL — ABNORMAL LOW (ref 13.5–18.0)

## 2014-11-30 LAB — BASIC METABOLIC PANEL
Anion gap: 10 (ref 5–15)
Anion gap: 8 (ref 5–15)
BUN: 40 mg/dL — ABNORMAL HIGH (ref 6–20)
BUN: 47 mg/dL — AB (ref 6–20)
CALCIUM: 8.3 mg/dL — AB (ref 8.9–10.3)
CALCIUM: 8 mg/dL — AB (ref 8.9–10.3)
CHLORIDE: 92 mmol/L — AB (ref 101–111)
CO2: 26 mmol/L (ref 22–32)
CO2: 26 mmol/L (ref 22–32)
CREATININE: 1.53 mg/dL — AB (ref 0.61–1.24)
Chloride: 95 mmol/L — ABNORMAL LOW (ref 101–111)
Creatinine, Ser: 1.74 mg/dL — ABNORMAL HIGH (ref 0.61–1.24)
GFR calc Af Amer: 48 mL/min — ABNORMAL LOW (ref >60)
GFR, EST AFRICAN AMERICAN: 57 mL/min — AB (ref >60)
GFR, EST NON AFRICAN AMERICAN: 49 mL/min — AB (ref >60)
GFR, EST NON AFRICAN AMERICAN: 42 mL/min — AB (ref >60)
GLUCOSE: 98 mg/dL (ref 65–99)
Glucose, Bld: 171 mg/dL — ABNORMAL HIGH (ref 65–99)
Potassium: 4.1 mmol/L (ref 3.5–5.1)
Potassium: 4.3 mmol/L (ref 3.5–5.1)
SODIUM: 128 mmol/L — AB (ref 135–145)
Sodium: 129 mmol/L — ABNORMAL LOW (ref 135–145)

## 2014-11-30 LAB — GLUCOSE, CAPILLARY
GLUCOSE-CAPILLARY: 148 mg/dL — AB (ref 65–99)
Glucose-Capillary: 143 mg/dL — ABNORMAL HIGH (ref 65–99)
Glucose-Capillary: 182 mg/dL — ABNORMAL HIGH (ref 65–99)
Glucose-Capillary: 149 mg/dL — ABNORMAL HIGH (ref 65–99)
Glucose-Capillary: 177 mg/dL — ABNORMAL HIGH (ref 65–99)

## 2014-11-30 LAB — H. PYLORI ANTIBODY, IGG: H PYLORI IGG: 1.1 U/mL — AB (ref 0.0–0.8)

## 2014-11-30 MED ORDER — TORSEMIDE 20 MG PO TABS
20.0000 mg | ORAL_TABLET | Freq: Two times a day (BID) | ORAL | Status: DC
  Administered 2014-12-01: 20 mg via ORAL
  Filled 2014-11-30: qty 1

## 2014-11-30 MED ORDER — TORSEMIDE 20 MG PO TABS: 60.0000 mg | ORAL_TABLET | Freq: Two times a day (BID) | ORAL | Status: DC

## 2014-11-30 MED ORDER — CLARITHROMYCIN 500 MG PO TABS
500.0000 mg | ORAL_TABLET | Freq: Two times a day (BID) | ORAL | Status: DC
  Administered 2014-11-30 – 2014-12-05 (×11): 500 mg via ORAL
  Filled 2014-11-30 (×12): qty 1

## 2014-11-30 MED ORDER — SODIUM CHLORIDE 0.9 % IV BOLUS (SEPSIS): 250.0000 mL | Freq: Once | INTRAVENOUS | Status: DC

## 2014-11-30 MED ORDER — DEXTROSE 5 % IV SOLN
0.0000 ug/min | INTRAVENOUS | Status: DC
  Administered 2014-11-30: 2 ug/min via INTRAVENOUS
  Administered 2014-12-01: 12 ug/min via INTRAVENOUS
  Filled 2014-11-30 (×3): qty 4

## 2014-11-30 MED ORDER — PANTOPRAZOLE SODIUM 40 MG PO TBEC
40.0000 mg | DELAYED_RELEASE_TABLET | Freq: Two times a day (BID) | ORAL | Status: DC
  Administered 2014-11-30 – 2014-12-05 (×11): 40 mg via ORAL
  Filled 2014-11-30 (×11): qty 1

## 2014-11-30 MED ORDER — SODIUM CHLORIDE 0.9 % IV BOLUS (SEPSIS)
500.0000 mL | Freq: Once | INTRAVENOUS | Status: AC
  Administered 2014-11-30: 500 mL via INTRAVENOUS

## 2014-11-30 MED ORDER — AMOXICILLIN 500 MG PO CAPS
1000.0000 mg | ORAL_CAPSULE | Freq: Two times a day (BID) | ORAL | Status: DC
  Administered 2014-11-30 – 2014-12-05 (×11): 1000 mg via ORAL
  Filled 2014-11-30 (×12): qty 2

## 2014-11-30 NOTE — Progress Notes (Signed)
Subjective: No events overnight.  Patient states he "feels great".  No chest pain, shortness of breath.  Has not had bowel movement in couple days but otherwise has not noticed any more bleeding.  No nausea, vomiting.  Has a good appetite.  States he is ready to go home.  Objective: Vital signs in last 24 hours: Filed Vitals:   11/29/14 2035 11/29/14 2347 11/30/14 0340 11/30/14 0958  BP: 107/50 81/47 90/46  132/64  Pulse:      Temp: 97.6 F (36.4 C) 97.8 F (36.6 C) 98.6 F (37 C)   TempSrc: Oral Oral Oral   Resp: Height:      Weight:   201 lb 8 oz (91.4 kg)   SpO2: 99% 99% 98%    Weight change: -1 lb 14.4 oz (-0.862 kg)  Intake/Output Summary (Last 24 hours) at 11/30/14 1143 Last data filed at 11/30/14 1100  Gross per 24 hour  Intake   1168 ml  Output   4650 ml  Net  -3482 ml   General: lying in bed, watching TV, no distress HEENT: EOMI, right IJ in place Cardiac: irregular rate, irregular rhythm, mildly tachcardic Pulm: normal work of breathing, clear to auscultation Abd: soft, nontender, nondistended, BS present Ext: warm and well perfused, mild lower extremity edema Neuro: alert and oriented X3  Lab Results: Basic Metabolic Panel:  Recent Labs Lab 11/25/14 0500  11/29/14 0537 11/30/14 0345  NA 128*  < > 131* 128*  K 3.7  < > 4.3 4.1  CL 91*  < > 100* 92*  CO2 30  < > 23 26  GLUCOSE 208*  < > 121* 171*  BUN 32*  < > 46* 40*  CREATININE 1.40*  < > 1.27* 1.53*  CALCIUM 7.9*  < > 8.6* 8.3*  MG 1.5*  --   --   --   < > = values in this interval not displayed. CBC:  Recent Labs Lab 11/24/14 0455  11/29/14 1815 11/30/14 0345  WBC 11.8*  < > 10.5 12.8*  NEUTROABS 8.6*  --   --   --   HGB 9.6*  < > 8.6* 9.3*  HCT 33.1*  < > 27.9* 29.8*  MCV 66.9*  < > 76.0* 76.4*  PLT 316  < > 227 246  < > = values in this interval not displayed. Cardiac Enzymes:  Recent Labs Lab 11/23/14 1605  TROPONINI 0.05*   CBG:  Recent Labs Lab  11/28/14 2152 11/29/14 0821 11/29/14 1204 11/29/14 1644 11/29/14 2143 11/30/14 0826  GLUCAP 150* 139* 148* 167* 175* 143*    Medications: Scheduled Meds: . sodium chloride   Intravenous Once  . sodium chloride   Intravenous Once  . sodium chloride   Intravenous Once  . alteplase  2 mg Intracatheter Once  . amiodarone  200 mg Oral BID  . amoxicillin  1,000 mg Oral BID  . clarithromycin  500 mg Oral BID  . digoxin  0.0625 mg Oral Daily  . insulin aspart  0-20 Units Subcutaneous TID WC  . insulin aspart  0-5 Units Subcutaneous QHS  . insulin aspart  3 Units Subcutaneous TID WC  . insulin glargine  10 Units Subcutaneous BID  . lisinopril  2.5 mg Oral Daily  . pantoprazole  40 mg Oral BID  . potassium chloride  20 mEq Oral BID  . sodium chloride  3 mL Intravenous Q12H  . [START ON 12/01/2014] torsemide  20 mg Oral BID  Continuous Infusions: . sodium chloride     PRN Meds:.albuterol, ALPRAZolam, ondansetron **OR** ondansetron (ZOFRAN) IV, oxyCODONE Assessment/Plan: Active Problems:   Essential hypertension   CAD- s/p CABG July 2014 [facility]   DM (diabetes mellitus), type 2 with peripheral vascular complications   Anemia of chronic disease   Atrial fibrillation with rapid ventricular response   Homelessness   Acute on chronic heart failure   Atrial fibrillation, unspecified   Acute on chronic combined systolic and diastolic CHF (congestive heart failure)   Acute on chronic systolic congestive heart failure   Acute respiratory distress   Cardiogenic shock   GI bleed   Acute blood loss anemia   Hemorrhagic shock   Duodenal ulcer with hemorrhage  59 yo male with systolic CHF, DM II, HTN, CAD s/p CABG, afib on anticoag, here with CHF exacerbation.  GI bleed - EGD with finding of large, bleeding duodenal ulcer.  Treated with epinephrine and clipping.   -GI following, appreciate their recommendations -Xarelto stopped, will follow their direction on restarting after  tomorrow -transition to PPI bid oral dosing for 2-3 months, then once daily permanently -BUN 40 today, stable -Hgb stable 9.3 -Serum H. Pylori positive.  Antibiotics per GI: Biaxin and amoxicillin x 10 days. -monitor CBC with transfusion threshold <8  Cardiogenic shock from Acute on chronic systolic CHF - ischemic cardiomyopathy -heart failure team following and appreciate their recommendations. -CO-OX today 42-->83 on redraw, unclear which is more accurate. BP still soft  Atrial fibrillation with RVR - CHADSVASC2 of 4. -amiodarone for rate control -off xarelto due to GI bleed -hold beta blockers with cardiogenic shock  Diabetes mellitus type II -glucose in 170s -sliding scale insulin + meal time + qhs coverage.  -Lantus bid -hemoglobin A1c 8.1 in August 2016 -will consult dietician for recommendations for when he is outpatient  CAD s/p CABG  -holding asa for now b/c of GI bleed  Hyperlipidemia -holding atorvastatin  Homelessness - appreciate CHF team's effort in arranging housing for him.  Dispo: Disposition is deferred at this time, awaiting improvement of current medical problems.  Consult palliative care for goals of care as pt not a candidate for advanced therapies given homelessness and limited social support.  The patient does have a current PCP ([name], MD) and does not need an [facility] hospital follow-up appointment after discharge.  The patient does have transportation limitations that hinder transportation to clinic appointments.    LOS: 7 days   [name], DO 11/30/2014, 11:43 AM

## 2014-11-30 NOTE — Progress Notes (Signed)
Daily Rounding Note  11/30/2014, 8:49 AM  LOS: 7 days   SUBJECTIVE:       Eating well, feels well.  No BMs yest or today, norm is about q 2 to 3 day BMs.    OBJECTIVE:         Vital signs in last 24 hours:    Temp:  [97.6 F (36.4 C)-98.6 F (37 C)] 98.6 F (37 C) (09/07 0340) Pulse Rate:  [99-107] 99 (09/06 1643) Resp:  [18-21] 20 (09/07 0340) BP: (81-130)/(46-93) 90/46 mmHg (09/07 0340) SpO2:  [98 %-100 %] 98 % (09/07 0340) Weight:  [201 lb 8 oz (91.4 kg)] 201 lb 8 oz (91.4 kg) (09/07 0340) Last BM Date: 11/22/14 Filed Weights   11/28/14 0421 11/29/14 0300 11/30/14 0340  Weight: 204 lb 14.4 oz (92.942 kg) 203 lb 6.4 oz (92.262 kg) 201 lb 8 oz (91.4 kg)   General: looks chronically unwell.  Despite tan, coloring is ashen.    Heart: RRR Chest: clear bil Abdomen: soft, active BS, NT  Extremities: no CCE Neuro/Psych:  Oriented x 3, a bit agitated.  Fully alert.   Intake/Output from previous day: 09/06 0701 - 09/07 0700 In: 1246.7 [P.O.:690; I.V.:556.7] Out: 4650 [Urine:4650]  Intake/Output this shift:    Lab Results:  Recent Labs  11/29/14 0537 11/29/14 1815 11/30/14 0345  WBC 11.7* 10.5 12.8*  HGB 8.4* 8.6* 9.3*  HCT 26.5* 27.9* 29.8*  PLT 240 227 246   BMET  Recent Labs  11/28/14 0518 11/29/14 0537 11/30/14 0345  NA 128* 131* 128*  K 4.7 4.3 4.1  CL 94* 100* 92*  CO2 GLUCOSE 149* 121* 171*  BUN 70* 46* 40*  CREATININE 1.48* 1.27* 1.53*  CALCIUM 8.5* 8.6* 8.3*   Scheduled Meds: . sodium chloride   Intravenous Once  . sodium chloride   Intravenous Once  . sodium chloride   Intravenous Once  . alteplase  2 mg Intracatheter Once  . amiodarone  200 mg Oral BID  . digoxin  0.0625 mg Oral Daily  . insulin aspart  0-20 Units Subcutaneous TID WC  . insulin aspart  0-5 Units Subcutaneous QHS  . insulin aspart  3 Units Subcutaneous TID WC  . insulin glargine  10 Units Subcutaneous  BID  . lisinopril  2.5 mg Oral Daily  . [START ON 12/02/2014] pantoprazole (PROTONIX) IV  40 mg Intravenous Q12H  . potassium chloride  20 mEq Oral BID  . sodium chloride  3 mL Intravenous Q12H  . torsemide  60 mg Oral BID   Continuous Infusions: . sodium chloride    . pantoprozole (PROTONIX) infusion 8 mg/hr (11/29/14 2000)   PRN Meds:.albuterol, ALPRAZolam, ondansetron **OR** ondansetron (ZOFRAN) IV, oxyCODONE   ASSESMENT:   * UGI bleed in setting of Xarelto. 11/28/14 EGD: large, bleeding duodenal ulcer with overlying clot. Treated with epi and single hemoclip.  On IV Protonix. Serum HPylori is positive.   * ABL on top of chronic, microcytic anemia dating back to 04/2013. S/p 5 PRBCs, last on 9/6. Hgb stable.   * CHF, cardiogenic shock. Improved.   * AKI. CKD.   * DM2, on insulin as inpt, oral med as outpt.   * Homeless, complicates mgt.     PLAN   *  Switch to po BID PPI today for 2 to 3 months, then once daily permanently. . Hold blood thinners through tomorrow 9/8.  *  Add abx:  biaxin and amoxil x 10 days for tx of elevated serum H Pylori. Discussed with pt    Jacob [name]  11/30/2014, 8:49 AM Pager: [phone]   ________________________________________________________________________  [facility] MD note:  I personally examined the patient, reviewed the data and agree with the assessment and plan described above.  H pylori serology was positive and so he will undergo appropriate antibiotic treatment. See above. He should continue twice daily PPI for another 2 months and then OK to decrease to once daily PPI.  No sign of rebleeding as of yet.  Please call if any other questions or concerns.  If he has overt rebleeding, would likely repeat EGD.   Rob Bunting, MD [facility] Pager [phone]

## 2014-11-30 NOTE — Progress Notes (Signed)
Patient c/o dizziness BP checked 71/28 HR 99 MD notified new orders recieved

## 2014-11-30 NOTE — Progress Notes (Signed)
Brief Nutrition Education Note  RD received consult for DM diet education. Chart review reveals that pt is homeless; CHF team and CSW are assisting pt to find permanent housing (pt refuses shelter placement).   [facility] and [facility]. Lab records review Hgb A1c has historically ranged from 6.9-9.9 (7-8.2 this year). Last reading was 8.1 on 11/12/14. DM home regimen is 10 mg Glipizide BID. Current hospital regimen is Lantus BID with sliding scale meal and q HS coverage.   Pt was sleeping soundly at time of visit. Left "Plate Method Menu Ideas" on bedside table for pt to review.   Reade Trefz A. Mayford Knife, RD, LDN, CDE Pager: [phone] After hours Pager: [phone]

## 2014-11-30 NOTE — Progress Notes (Signed)
RN call because pts BP in the 60's and he is dizzy.  CVP was 1 this morning so torsemide stopped; 4 more recently.  Recent GI bleed.  No BM today.  Will check CBC, BMET and give 250 bolus.  Reevaluate after.  Karesha Trzcinski PAC.

## 2014-11-30 NOTE — Progress Notes (Signed)
Pt continues to take blood pressure cuff off. I have stressed the importance of it being on considering he is on Levophed. He states "I will not wear this all night." As of now the cuff is on. Will continue to monitor.

## 2014-11-30 NOTE — Progress Notes (Signed)
Spoke w np amy clegg. Community sw working on housing. Have req hosp sw ck w pt to see if they can assist. [facility].

## 2014-11-30 NOTE — Hospital Discharge Follow-Up (Signed)
The patient is known to the [facility] at the [facility] and he has his prescriptions filled at [facility].  Met with him to discuss his plans for follow up after discharge and he is agreeable to continue to receive his care at the [facility]. An appointment has been scheduled for him in the [facility] on 12/06/14 @ 1530 with Dr Jarold Song. The information will be added to the AVS.   He said that he does not want to continue to live in his car because he feels that he will just end up back in the hospital again.  He said that he met with a Ms. Edsel Petrin # [phone] who is trying to get him housing. He noted that he did not know where she is from but he gave this CM permission to contact her and inquire about the assistance that she can provide.   This CM contacted Ms Edsel Petrin when in the room with the patient. She said that she is from [place] [employer] and she has subsidized housing for him but it will not be available until after 12/08/14. She noted that he must see the residence first and then it would be approximately 5-7 days until he could move in. She said that they have offered him a room at the shelter in [facility] after he is discharged from the hospital until his permanent residence is available.  She noted that he has refused to go to the shelter, even for a few days.  She said that she will be coming to the hospital tomorrow to discuss further with him.  This CM explained the status of the housing to the patient and he adamantly refuses to go to a shelter. He said that he will need to stay in his car. He also adamantly stated that he has no family or friends to stay with even for a short time.  Informed him that Ms. Edsel Petrin plans to visit with him tomorrow.   Update provided to Elissa Hefty, CM.

## 2014-11-30 NOTE — Progress Notes (Signed)
CARDIAC REHAB PHASE I   PRE:  Rate/Rhythm: 110 afib  BP:  Supine:   Sitting: 92/46  Standing:    SaO2: 100%RA  MODE:  Ambulation: 350 ft   POST:  Rate/Rhythm: 130 afib  BP:  Supine: 87/52  Sitting:   Standing:    SaO2: 100%RA 1330-1406 Pt did not want to walk,just wanted to go to bed. Convinced pt to take a little walk with me and then I would assist to bed. Pt walked 350 ft on RA with asst x 1 and holding to side rail at times. Stopped twice to rest. Stated SOB but denied dizziness. To bed after walk. Left CHF booklet and low sodium diets and tobacco cessation sheet for pt to read when not sleepy.   Luetta Nutting, RN BSN  11/30/2014 2:03 PM

## 2014-11-30 NOTE — Progress Notes (Signed)
Advanced Heart Failure Rounding Note   Subjective:    Transferred to ICU on 8/31 --> cardiogenic shock. He was also back in A fib so he was loaded on amio. Central line placed. Initial CO-OX 44% and CVP 27. Started on norepi due to hypotension and diuresed with IV lasix.   9/1  Milrinone 0.25 mcg, Amio increased to 60 mg per hour,  norepi was weaned off. Brisk diuresis noted.  9/2 Milrinone 0.25 mcg+ Amio 60 mg per hour+ Norepi 5 mcg.  9/4 Developed GIB. Received 3 units RBCs. Xarelto stopped  9/5 EGD- Duodenal Bleed- Clip applied.  Milrinone 0.125 mcg + levo 6 mcg+ amio  9/6- Hgb 8.4 CO-OX 70% Milrinone stopped with adequate CO-OX (65% off milrinone) . Transitioned to po amiodarone.  Transitioned to torsemide.    Denies SOB/Orthopnea. Feels much better. No dyspnea. No further bleeding. CVP 1. Wants to go home    Objective:   Weight Range:  Vital Signs:   Temp:  [97.6 F (36.4 C)-98.6 F (37 C)] 98.6 F (37 C) (09/07 0340) Pulse Rate:  [98-107] 99 (09/06 1643) Resp:  [18-21] 20 (09/07 0340) BP: (81-130)/(46-93) 90/46 mmHg (09/07 0340) SpO2:  [98 %-100 %] 98 % (09/07 0340) Weight:  [201 lb 8 oz (91.4 kg)] 201 lb 8 oz (91.4 kg) (09/07 0340) Last BM Date: 11/22/14  Weight change: Filed Weights   11/28/14 0421 11/29/14 0300 11/30/14 0340  Weight: 204 lb 14.4 oz (92.942 kg) 203 lb 6.4 oz (92.262 kg) 201 lb 8 oz (91.4 kg)    Intake/Output:   Intake/Output Summary (Last 24 hours) at 11/30/14 0736 Last data filed at 11/30/14 0700  Gross per 24 hour  Intake 1246.7 ml  Output   4350 ml  Net -3103.3 ml     Physical Exam: CVP ~1 General:  Sitting in chair.   No resp difficulty HEENT: normal.  Neck: supple. RIJ TLC JVP flat  Carotids 2+ bilat; no bruits. No lymphadenopathy or thryomegaly appreciated. Cor: PMI nondisplaced. Irregular rate & rhythm. No rubs, or murmurs.+ S3  Lungs: clear Abdomen: soft, nontender, nondistended. No hepatosplenomegaly. No bruits or masses. Good  bowel sounds. Extremities: no cyanosis, clubbing, rash, R and LLE  Neuro: alert & orientedx3, cranial nerves grossly intact. moves all 4 extremities w/o difficulty. Affect pleasant GU:  foley   Telemetry: A fib 100-110   Labs: Basic Metabolic Panel:  Recent Labs Lab 11/25/14 0500 11/26/14 0316 11/27/14 0524 11/28/14 0518 11/29/14 0537 11/30/14 0345  NA 128* 127* 124* 128* 131* 128*  K 3.7 4.7 5.5* 4.7 4.3 4.1  CL 91* 88* 88* 94* 100* 92*  CO2 30 32 25 26 23 26   GLUCOSE 208* 194* 280* 149* 121* 171*  BUN 32* 49* 78* 70* 46* 40*  CREATININE 1.40* 1.25* 1.84* 1.48* 1.27* 1.53*  CALCIUM 7.9* 8.0* 7.9* 8.5* 8.6* 8.3*  MG 1.5*  --   --   --   --   --     Liver Function Tests: No results for input(s): AST, ALT, ALKPHOS, BILITOT, PROT, ALBUMIN in the last 168 hours. No results for input(s): LIPASE, AMYLASE in the last 168 hours. No results for input(s): AMMONIA in the last 168 hours.  CBC:  Recent Labs Lab 11/24/14 0455  11/28/14 0518 11/28/14 1810 11/28/14 2204 11/29/14 0537 11/29/14 1815 11/30/14 0345  WBC 11.8*  < > 18.5*  --  14.8* 11.7* 10.5 12.8*  NEUTROABS 8.6*  --   --   --   --   --   --   --  HGB 9.6*  < > 7.7* 8.0* 7.8* 8.4* 8.6* 9.3*  HCT 33.1*  < > 24.6* 25.2* 24.8* 26.5* 27.9* 29.8*  MCV 66.9*  < > 71.5*  --  73.4* 75.3* 76.0* 76.4*  PLT 316  < > 304  --  238 240 227 246  < > = values in this interval not displayed.  Cardiac Enzymes:  Recent Labs Lab 11/23/14 1058 11/23/14 1605  TROPONINI 0.05* 0.05*    BNP: BNP (last 3 results)  Recent Labs  10/29/14 1540 11/11/14 1833 11/23/14 0328  BNP 1232.4* 807.3* 866.8*    ProBNP (last 3 results)  Recent Labs  09/06/14 1107 09/20/14 1201 10/04/14 1256  PROBNP 1577.00* 3006.00* 2524.00*      Other results:  Imaging: No results found.   Medications:     Scheduled Medications: . sodium chloride   Intravenous Once  . sodium chloride   Intravenous Once  . sodium chloride    Intravenous Once  . alteplase  2 mg Intracatheter Once  . amiodarone  200 mg Oral BID  . digoxin  0.0625 mg Oral Daily  . insulin aspart  0-20 Units Subcutaneous TID WC  . insulin aspart  0-5 Units Subcutaneous QHS  . insulin aspart  3 Units Subcutaneous TID WC  . insulin glargine  10 Units Subcutaneous BID  . lisinopril  2.5 mg Oral Daily  . [START ON 12/02/2014] pantoprazole (PROTONIX) IV  40 mg Intravenous Q12H  . potassium chloride  20 mEq Oral BID  . sodium chloride  3 mL Intravenous Q12H  . torsemide  60 mg Oral BID    Infusions: . sodium chloride    . pantoprozole (PROTONIX) infusion 8 mg/hr (11/29/14 2000)    PRN Medications: albuterol, ALPRAZolam, ondansetron **OR** ondansetron (ZOFRAN) IV, oxyCODONE   Assessment:   1. Acute on chronic systolic CHF, ICM EF 20-25% with poor RV function, NYHA class IV -> Cardiogenic shock 2. Acute hypoxic respiratory failure 3. Chronic Afib RVR - no anticoagulation with GI bleed.s 4. 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 DM type II 6. Homelessness 7. Hypotension  8. AKI 9. Hyponatremia 10. Hypomagnesium 11. Acute GI bleed with blood loss anemia   Plan/Discussion:    9/5 S/P EGD- Duodenal Bleed with clot note. Clip placed. Hgb stable 9.3. today. On PPI. Will need PPI twice a day 2-3 months then daily per GI. Can restart anticoagulant tomorrow. .   Milrinone stopped 9/6 with stable CO-OX. Today CO-OX down to 42% this morning but difficulty with blood draw.  Repeat CO-OX was 83% which is likely inaccurate as well. Continue  dig 0.125 mg daily.  CVP 1 this morning. Stop torsemide. Restart tomorrow.  Continue  lisinopril 2.5 mg daily (home dose). Renal function stable. BP soft unable to titrate Ace. Hold off on spiro.   Continue amio 200 mg twice a day. Remains tachycardic 100-110s.   Hold off on Toprol XL for now with low output heart failure. No anticoagulant for another 24 hours. Per GI.     CLEGG,AMY NP-C  7:36 AM Advanced Heart  Failure Team Pager [phone] (M-F; 7a - 4p)  Please contact [facility] for night-coverage after hours (4p -7a ) and weekends on [email or web address]  Patient seen and examined with Tonye Becket, NP. We discussed all aspects of the encounter. I agree with the assessment and plan as stated above.   He looks good. Co-ox unclear. CVP low. Agree with holding diuretics today. Will restart diuretics tomorrow (liekly torsemide 20 bid)  along with Xarelto. Hopefully we can get him home by Friday. Not candidate for home inotropes. Continue amio for rate control. Ambulate.   Will need to make sure housing arrangements in place.   Keyauna Graefe,MD 8:32 AM

## 2014-12-01 ENCOUNTER — Inpatient Hospital Stay (HOSPITAL_COMMUNITY): Payer: Medicaid Other

## 2014-12-01 ENCOUNTER — Ambulatory Visit: Payer: Medicaid Other | Admitting: Family Medicine

## 2014-12-01 LAB — GLUCOSE, CAPILLARY
Glucose-Capillary: 292 mg/dL — ABNORMAL HIGH (ref 65–99)
Glucose-Capillary: 179 mg/dL — ABNORMAL HIGH (ref 65–99)
Glucose-Capillary: 76 mg/dL (ref 65–99)

## 2014-12-01 LAB — URINALYSIS, ROUTINE W REFLEX MICROSCOPIC
Bilirubin Urine: NEGATIVE
Glucose, UA: NEGATIVE mg/dL
Hgb urine dipstick: NEGATIVE
KETONES UR: NEGATIVE mg/dL
LEUKOCYTES UA: NEGATIVE
NITRITE: NEGATIVE
PROTEIN: NEGATIVE mg/dL
Specific Gravity, Urine: 1.009 (ref 1.005–1.030)
Urobilinogen, UA: 1 mg/dL (ref 0.0–1.0)
pH: 5.5 (ref 5.0–8.0)

## 2014-12-01 LAB — CBC
HCT: 31.1 % — ABNORMAL LOW (ref 39.0–52.0)
HCT: 31.1 % — ABNORMAL LOW (ref 39.0–52.0)
HEMOGLOBIN: 9.7 g/dL — AB (ref 13.0–17.0)
HEMOGLOBIN: 9.7 g/dL — AB (ref 13.0–17.0)
MCH: 23.8 pg — AB (ref 26.0–34.0)
MCH: 23.7 pg — ABNORMAL LOW (ref 26.0–34.0)
MCHC: 31.2 g/dL (ref 30.0–36.0)
MCHC: 31.2 g/dL (ref 30.0–36.0)
MCV: 76.4 fL — ABNORMAL LOW (ref 78.0–100.0)
MCV: 76 fL — AB (ref 78.0–100.0)
PLATELETS: 290 10*3/uL (ref 150–400)
Platelets: 293 10*3/uL (ref 150–400)
RBC: 4.07 MIL/uL — ABNORMAL LOW (ref 4.22–5.81)
RBC: 4.09 MIL/uL — AB (ref 4.22–5.81)
RDW: 23.6 % — AB (ref 11.5–15.5)
RDW: 23.2 % — ABNORMAL HIGH (ref 11.5–15.5)
WBC: 18.7 10*3/uL — ABNORMAL HIGH (ref 4.0–10.5)
WBC: 13.9 10*3/uL — AB (ref 4.0–10.5)

## 2014-12-01 LAB — CARBOXYHEMOGLOBIN
CARBOXYHEMOGLOBIN: 1.8 % — AB (ref 0.5–1.5)
CARBOXYHEMOGLOBIN: 1.7 % — AB (ref 0.5–1.5)
METHEMOGLOBIN: 0.8 % (ref 0.0–1.5)
METHEMOGLOBIN: 1 % (ref 0.0–1.5)
O2 SAT: 57.7 %
O2 Saturation: 54.9 %
TOTAL HEMOGLOBIN: 9.5 g/dL — AB (ref 13.5–18.0)
Total hemoglobin: 10 g/dL — ABNORMAL LOW (ref 13.5–18.0)

## 2014-12-01 LAB — BASIC METABOLIC PANEL
ANION GAP: 10 (ref 5–15)
BUN: 47 mg/dL — AB (ref 6–20)
CALCIUM: 8.2 mg/dL — AB (ref 8.9–10.3)
CO2: 23 mmol/L (ref 22–32)
CREATININE: 1.55 mg/dL — AB (ref 0.61–1.24)
Chloride: 93 mmol/L — ABNORMAL LOW (ref 101–111)
GFR calc Af Amer: 56 mL/min — ABNORMAL LOW (ref >60)
GFR, EST NON AFRICAN AMERICAN: 48 mL/min — AB (ref >60)
GLUCOSE: 181 mg/dL — AB (ref 65–99)
Potassium: 4.6 mmol/L (ref 3.5–5.1)
Sodium: 126 mmol/L — ABNORMAL LOW (ref 135–145)

## 2014-12-01 MED ORDER — DEXTROSE 5 % IV SOLN
0.0000 ug/min | INTRAVENOUS | Status: DC
  Administered 2014-12-01: 11 ug/min via INTRAVENOUS
  Filled 2014-12-01: qty 16

## 2014-12-01 MED ORDER — SODIUM CHLORIDE 0.9 % IV BOLUS (SEPSIS)
500.0000 mL | Freq: Once | INTRAVENOUS | Status: AC
  Administered 2014-12-01: 500 mL via INTRAVENOUS

## 2014-12-01 MED ORDER — RIVAROXABAN 20 MG PO TABS
20.0000 mg | ORAL_TABLET | Freq: Every day | ORAL | Status: DC
  Administered 2014-12-01 – 2014-12-04 (×4): 20 mg via ORAL
  Filled 2014-12-01 (×4): qty 1

## 2014-12-01 MED ORDER — SODIUM CHLORIDE 0.9 % IJ SOLN
10.0000 mL | INTRAMUSCULAR | Status: DC | PRN
  Administered 2014-12-05: 20 mL
  Filled 2014-12-01: qty 40

## 2014-12-01 MED ORDER — SODIUM CHLORIDE 0.9 % IV SOLN: INTRAVENOUS | Status: DC

## 2014-12-01 MED ORDER — SODIUM CHLORIDE 0.9 % IV BOLUS (SEPSIS)
250.0000 mL | Freq: Once | INTRAVENOUS | Status: AC
  Administered 2014-12-01: 250 mL via INTRAVENOUS

## 2014-12-01 MED ORDER — SODIUM CHLORIDE 0.9 % IJ SOLN
10.0000 mL | Freq: Two times a day (BID) | INTRAMUSCULAR | Status: DC
  Administered 2014-12-01: 10 mL
  Administered 2014-12-02: 20 mL
  Administered 2014-12-02 – 2014-12-05 (×5): 10 mL

## 2014-12-01 NOTE — Progress Notes (Signed)
Central line removed at 2200 12/01/14. Upon removal the tip of the central cathter touched bedding before being cut for lab culture. Internal Medicine made aware that tip of cathter had possible contamination, but they still requested RN to send sample. Pt's vital signs remain stable post central line pull, will continue to monitor pt.

## 2014-12-01 NOTE — Progress Notes (Signed)
CSW spoke with Ms. Jacob [name] this afternoon from the [employer] [facility] [employer] [facility] program. She indicated that they have located housing for Jacob [name] that will come available on Sept 15th and she has been talking to Jacob [name] about this.  Concerns were verbalized by Ms. Jacob [name] as there will be an anticipated period of time between hospital d/c and houseing- she spoke to Jacob [name] about agreeing to temporary shelter and he ADAMANTLY refused. He has consistently refused this option with this CSW throughout his multiple hospitalizations. As stated earlier- he has been enrolled in the [facility] but is not longer a participant.  Ms. Jacob [name] stated that she did everything possible to convince him of benefits of shelter but again he refuses. Thus- when d/c'd- Jacob [name] will return to living in his car. He has friends that help him with food and he actually "hangs out" with them during the day. Sleeps in his care at night. Jacob [name] has also consistently refused any type of rest home or SNF placement for short term care. He feels that he manages "ok" when he leaves- however- it he clearly states that he does not take his medications properly and has had frequent hospitalizations. Jacob [name] discussed with unit RNCM who stated that Jacob [name] may be stable for d/c tomorrow. This CSW has no further resources to offer Jacob [name] at this time.  Lupita Leash T. Jaci Lazier, [place] [phone]

## 2014-12-01 NOTE — Progress Notes (Signed)
Subjective: Patient seen and examined at bedside.  States he feels well, denies shortness of breath or chest pain.  States he is not having any dizziness and still hopeful to go home soon.  Last night, became hypotensive, started back on Levophed.  Objective: Vital signs in last 24 hours: Filed Vitals:   12/01/14 1453 12/01/14 1455 12/01/14 1630 12/01/14 1645  BP: 92/70 131/68 136/69 133/77  Pulse:      Temp:   98.2 F (36.8 C)   TempSrc:   Oral   Resp: 19 15 20 13   Height:      Weight:      SpO2:   98%    Weight change: 1 lb 3.2 oz (0.544 kg)  Intake/Output Summary (Last 24 hours) at 12/01/14 1720 Last data filed at 12/01/14 1630  Gross per 24 hour  Intake 2388.99 ml  Output   2875 ml  Net -486.01 ml   General: sitting up in chair, no distress HEENT: EOMI, right IJ in place without evidence of infection Cardiac: irregular rate, irregular rhythm, mildly tachcardic Pulm: normal work of breathing, clear to auscultation Abd: soft, nontender, nondistended, BS present Ext: warm and well perfused, mild lower extremity edema Neuro: alert and oriented X3  Lab Results: Basic Metabolic Panel:  Recent Labs Lab 11/25/14 0500  11/30/14 1843 12/01/14 0230  NA 128*  < > 129* 126*  K 3.7  < > 4.3 4.6  CL 91*  < > 95* 93*  CO2 30  < > 26 23  GLUCOSE 208*  < > 98 181*  BUN 32*  < > 47* 47*  CREATININE 1.40*  < > 1.74* 1.55*  CALCIUM 7.9*  < > 8.0* 8.2*  MG 1.5*  --   --   --   < > = values in this interval not displayed. CBC:  Recent Labs Lab 12/01/14 0230 12/01/14 1230  WBC 18.7* 13.9*  HGB 9.7* 9.7*  HCT 31.1* 31.1*  MCV 76.4* 76.0*  PLT 293 290   CBG:  Recent Labs Lab 11/30/14 1246 11/30/14 1740 11/30/14 2144 12/01/14 0812 12/01/14 1153 12/01/14 1644  GLUCAP 182* 149* 177* 292* 179* 76    Medications: Scheduled Meds: . alteplase  2 mg Intracatheter Once  . amiodarone  200 mg Oral BID  . amoxicillin  1,000 mg Oral BID  . clarithromycin  500 mg  Oral BID  . digoxin  0.0625 mg Oral Daily  . insulin aspart  0-20 Units Subcutaneous TID WC  . insulin aspart  0-5 Units Subcutaneous QHS  . insulin aspart  3 Units Subcutaneous TID WC  . insulin glargine  10 Units Subcutaneous BID  . pantoprazole  40 mg Oral BID  . potassium chloride  20 mEq Oral BID  . rivaroxaban  20 mg Oral Daily  . sodium chloride  250 mL Intravenous Once  . sodium chloride  500 mL Intravenous Once  . sodium chloride  3 mL Intravenous Q12H   Continuous Infusions: . sodium chloride 10 mL/hr at 12/01/14 0700  . norepinephrine (LEVOPHED) Adult infusion 8 mcg/min (12/01/14 1630)   PRN Meds:.albuterol, ALPRAZolam, ondansetron **OR** ondansetron (ZOFRAN) IV, oxyCODONE Assessment/Plan: Active Problems:   Essential hypertension   CAD- s/p CABG July 2014 [facility]   DM (diabetes mellitus), type 2 with peripheral vascular complications   Anemia of chronic disease   Atrial fibrillation with rapid ventricular response   Homelessness   Acute on chronic heart failure   Atrial fibrillation, unspecified   Acute  on chronic combined systolic and diastolic CHF (congestive heart failure)   Acute on chronic systolic congestive heart failure   Acute respiratory distress   Cardiogenic shock   GI bleed   Acute blood loss anemia   Hemorrhagic shock   Duodenal ulcer with hemorrhage  59 yo male with systolic CHF, DM II, HTN, CAD s/p CABG, afib on anticoag, here with CHF exacerbation.  GI bleed - EGD with finding of large, bleeding duodenal ulcer.  Treated with epinephrine and clipping.   -GI following, appreciate their recommendations -resume Xarelto -Protonix  PO bid -Hgb stable 9.7 -Serum H. Pylori positive.  Antibiotics per GI: Biaxin and amoxicillin x 10 days. -monitor CBC with transfusion threshold <8  Cardiogenic shock from Acute on chronic systolic CHF - ischemic cardiomyopathy -heart failure team following and appreciate their recommendations. -CO-OX today  at 55.  Back on levophed .  Volume status low today.  Holding diuretecs, lisinopril -PICC placement today as patient unable to tolerate central line anymore  Atrial fibrillation with RVR - CHADSVASC2 of 4. -amiodarone for rate control -resume xarelto  -hold beta blockers with cardiogenic shock  Leukocytosis -elevated to 18.7 from 14.0.  No obvious source of infection.  Pt afebrile -blood cx x2, urine cx, catheter tip cx  Diabetes mellitus type II -glucose in 170s -sliding scale insulin + meal time + qhs coverage.  -Lantus bid -hemoglobin A1c 8.1 in August 2016 -Dietician consult  CAD s/p CABG  -holding asa for now b/c of GI bleed  Hyperlipidemia -holding atorvastatin  Homelessness - appreciate CHF team's effort in arranging housing for him.  Dispo: Disposition is deferred at this time, awaiting improvement of current medical problems.    The patient does have a current PCP ([name], MD) and does not need an [facility] hospital follow-up appointment after discharge.  The patient does have transportation limitations that hinder transportation to clinic appointments.    LOS: 8 days   [name], DO 12/01/2014, 5:20 PM

## 2014-12-01 NOTE — Progress Notes (Signed)
RN found patient pulling on central line dressing, with dressing now half off and central line pulled out 6 cm from 20 mark (at 14 cm mark presently).  Blood return obtained from all ports.  Site cleaned with chlorhexidine and new dressing applied following policy/procedure.  Pt instructed again to not touch central line dressing or tubing so as not to cause an infection.  Pt verbalized understanding and agreed not to touch central line or dressing.  Dr. Gala Romney notified of incident.  Will continue to monitor pt closely.

## 2014-12-01 NOTE — Progress Notes (Signed)
Dr. Gala Romney notified of CVP of 1.  Also clarified if torsemide was to be given.  Orders received.  Will continue to monitor pt closely.

## 2014-12-01 NOTE — Progress Notes (Signed)
Advanced Heart Failure Rounding Note   Subjective:    Transferred to ICU on 8/31 --> cardiogenic shock. He was also back in A fib so he was loaded on amio. Central line placed. Initial CO-OX 44% and CVP 27. Started on norepi due to hypotension and diuresed with IV lasix.   9/1  Milrinone 0.25 mcg, Amio increased to 60 mg per hour,  norepi was weaned off. Brisk diuresis noted.  9/2 Milrinone 0.25 mcg+ Amio 60 mg per hour+ Norepi 5 mcg.  9/4 Developed GIB. Received 3 units RBCs. Xarelto stopped  9/5 EGD- Duodenal Bleed- Clip applied.  Milrinone 0.125 mcg + levo 6 mcg+ amio  9/6- Hgb 8.4 CO-OX 70% Milrinone stopped with adequate CO-OX (65% off milrinone) . Transitioned to po amiodarone.  Transitioned to torsemide.  9/7 Hypotensive. 250 NS bolus. Started back norepi. No diuretic.    Denies SOB/Orthopnea. No dizziness. Remains on levophed at 13. BP improved. CVp 2. Anxious to go home. He pulled back central line 6cm last night.    Objective:   Weight Range:  Vital Signs:   Temp:  [97.2 F (36.2 C)-98.5 F (36.9 C)] 97.7 F (36.5 C) (09/08 0730) Pulse Rate:  [99-115] 99 (09/08 0730) Resp:  [12-25] 12 (09/08 0830) BP: (79-139)/(46-107) 139/53 mmHg (09/08 0830) SpO2:  [96 %-100 %] 96 % (09/08 0730) Weight:  [202 lb 11.2 oz (91.944 kg)] 202 lb 11.2 oz (91.944 kg) (09/08 0500) Last BM Date: 12/01/14  Weight change: Filed Weights   11/29/14 0300 11/30/14 0340 12/01/14 0500  Weight: 203 lb 6.4 oz (92.262 kg) 201 lb 8 oz (91.4 kg) 202 lb 11.2 oz (91.944 kg)    Intake/Output:   Intake/Output Summary (Last 24 hours) at 12/01/14 0926 Last data filed at 12/01/14 0630  Gross per 24 hour  Intake 1095.98 ml  Output   2050 ml  Net -954.02 ml     Physical Exam: CVP ~2-3 General:  Sitting in chair.   No resp difficulty HEENT: normal.  Neck: supple. RIJ TLC JVP flat  Carotids 2+ bilat; no bruits. No lymphadenopathy or thryomegaly appreciated. Cor: PMI nondisplaced. Irregular rate &  rhythm. No rubs, or murmurs.+ S3  Lungs: clear Abdomen: soft, nontender, nondistended. No hepatosplenomegaly. No bruits or masses. Good bowel sounds. Extremities: no cyanosis, clubbing, rash, R and LLE  Neuro: alert & orientedx3, cranial nerves grossly intact. moves all 4 extremities w/o difficulty. Affect pleasant GU:  foley   Telemetry: A fib 100-110   Labs: Basic Metabolic Panel:  Recent Labs Lab 11/25/14 0500  11/28/14 0518 11/29/14 0537 11/30/14 0345 11/30/14 1843 12/01/14 0230  NA 128*  < > 128* 131* 128* 129* 126*  K 3.7  < > 4.7 4.3 4.1 4.3 4.6  CL 91*  < > 94* 100* 92* 95* 93*  CO2 30  < > 26 23 26 26 23   GLUCOSE 208*  < > 149* 121* 171* 98 181*  BUN 32*  < > 70* 46* 40* 47* 47*  CREATININE 1.40*  < > 1.48* 1.27* 1.53* 1.74* 1.55*  CALCIUM 7.9*  < > 8.5* 8.6* 8.3* 8.0* 8.2*  MG 1.5*  --   --   --   --   --   --   < > = values in this interval not displayed.  Liver Function Tests: No results for input(s): AST, ALT, ALKPHOS, BILITOT, PROT, ALBUMIN in the last 168 hours. No results for input(s): LIPASE, AMYLASE in the last 168 hours. No results for input(s):  AMMONIA in the last 168 hours.  CBC:  Recent Labs Lab 11/29/14 0537 11/29/14 1815 11/30/14 0345 11/30/14 1843 12/01/14 0230  WBC 11.7* 10.5 12.8* 14.0* 18.7*  HGB 8.4* 8.6* 9.3* 9.0* 9.7*  HCT 26.5* 27.9* 29.8* 29.1* 31.1*  MCV 75.3* 76.0* 76.4* 76.6* 76.4*  PLT 240 227 246 251 293    Cardiac Enzymes: No results for input(s): CKTOTAL, CKMB, CKMBINDEX, TROPONINI in the last 168 hours.  BNP: BNP (last 3 results)  Recent Labs  10/29/14 1540 11/11/14 1833 11/23/14 0328  BNP 1232.4* 807.3* 866.8*    ProBNP (last 3 results)  Recent Labs  09/06/14 1107 09/20/14 1201 10/04/14 1256  PROBNP 1577.00* 3006.00* 2524.00*      Other results:  Imaging: No results found.   Medications:     Scheduled Medications: . sodium chloride   Intravenous Once  . sodium chloride   Intravenous Once   . sodium chloride   Intravenous Once  . alteplase  2 mg Intracatheter Once  . amiodarone  200 mg Oral BID  . amoxicillin  1,000 mg Oral BID  . clarithromycin  500 mg Oral BID  . digoxin  0.0625 mg Oral Daily  . insulin aspart  0-20 Units Subcutaneous TID WC  . insulin aspart  0-5 Units Subcutaneous QHS  . insulin aspart  3 Units Subcutaneous TID WC  . insulin glargine  10 Units Subcutaneous BID  . lisinopril  2.5 mg Oral Daily  . pantoprazole  40 mg Oral BID  . potassium chloride  20 mEq Oral BID  . sodium chloride  250 mL Intravenous Once  . sodium chloride  3 mL Intravenous Q12H  . torsemide  20 mg Oral BID    Infusions: . sodium chloride    . norepinephrine (LEVOPHED) Adult infusion      PRN Medications: albuterol, ALPRAZolam, ondansetron **OR** ondansetron (ZOFRAN) IV, oxyCODONE   Assessment:   1. Acute on chronic systolic CHF, ICM EF 20-25% with poor RV function, NYHA class IV -> Cardiogenic shock 2. Acute hypoxic respiratory failure 3. Chronic Afib RVR - no anticoagulation with GI bleed.s 4. 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 DM type II 6. Homelessness 7. Hypotension  8. AKI 9. Hyponatremia 10. Hypomagnesium 11. Acute GI bleed with blood loss anemia   Plan/Discussion:    9/5 S/P EGD- Duodenal Bleed with clot note. Clip placed. Hgb stable 9.7. today. On PPI. Will need PPI twice a day 2-3 months then daily per GI. Can restart anticoagulant today. .   Milrinone stopped 9/6 with stable CO-OX. Back on levo 13 mcg. Todays CO-OX 55%. Continue  dig 0.125 mg daily.  Volume status low today.  Hold diuretics today. No spiro. Stop  lisinopril 2.5 mg daily (home dose). Renal function stable.   Continue amio 200 mg twice a day. Remains tachycardic 100-110s.   Hold off on Toprol XL for now with low output heart failure. Per GI can restart Xarelto today.   CLEGG,AMY NP-C  9:26 AM Advanced Heart Failure Team Pager [phone] (M-F; 7a - 4p)  Please contact [facility] for  night-coverage after hours (4p -7a ) and weekends on [email or web address]  Patient seen and examined with Tonye Becket, NP. We discussed all aspects of the encounter. I agree with the assessment and plan as stated above.   He looks better but still quite tenuous. SBP in 60s yesterday. Given NS 250cc and started back on levophed. Now on 13 mcg. Will given another 250cc NS today. Hold diuretics. Continue  milrinone. Will try to wean levophed. Given dislodgement of central line will place PICC. Continue amio for rate control. OVerall ventricular rates still mildly elevated but would not add b-blocker due to low output. Continue digoxin. Resume Xarelto.   The patient is critically ill with multiple organ systems failure and requires high complexity decision making for assessment and support, frequent evaluation and titration of therapies, application of advanced monitoring technologies and extensive interpretation of multiple databases.   Critical Care Time devoted to patient care services described in this note is 35 Minutes.  Bensimhon, Daniel,MD 12:10 PM

## 2014-12-01 NOTE — Progress Notes (Signed)
Peripherally Inserted Central Catheter/Midline Placement  The IV Nurse has discussed with the patient and/or persons authorized to consent for the patient, the purpose of this procedure and the potential benefits and risks involved with this procedure.  The benefits include less needle sticks, lab draws from the catheter and patient may be discharged home with the catheter.  Risks include, but not limited to, infection, bleeding, blood clot (thrombus formation), and puncture of an artery; nerve damage and irregular heat beat.  Alternatives to this procedure were also discussed.  PICC/Midline Placement Documentation        Jacob [name], Jacob [name] 12/01/2014, 6:26 PM

## 2014-12-01 NOTE — Progress Notes (Signed)
Pt states "I do not want to be touched until the Doctors come by." Tried to explain to pt the importance of vital sign especially his blood pressure. He is still not wanting to comply. Will continue to monitor.

## 2014-12-01 NOTE — Progress Notes (Signed)
   12/01/14 1315 12/01/14 1330 12/01/14 1453  Vitals  BP 94/74 mmHg (!) 84/49 mmHg (pt refusing bps in either arm) 92/70 mmHg  MAP (mmHg) 79 61 75  BP Location Left Leg Left Leg Left Arm  BP Method Automatic Automatic Automatic  Patient Position (if appropriate) Sitting Sitting Sitting  ECG Heart Rate (!) 115 --  98  Resp 16 --  19     12/01/14 1455  Vitals  BP 131/68 mmHg  MAP (mmHg) 83  BP Location Right Arm  BP Method Automatic  Patient Position (if appropriate) Sitting  ECG Heart Rate (!) 102  Resp 15  RN noted that there is a 40 point difference between blood pressures in right arm and left arm or left leg, with right arm giving best pressure.  Tonye Becket, NP notified.   Will titrate levophed based on right arm pressures. Will continue to monitor pt closely.

## 2014-12-01 NOTE — Progress Notes (Signed)
Pt BP 125/68 MAP 88 after turning Levo off for 2 hours. Due to pts PVD RN is only able to take BP on right lower forearm. Cardiology Azeem MD notified that pt has new PICC line placed in right arm, but this arm was the only extremity to take BP. Since levo is off and BP has improved a verbal care order was given by Cardiology to take manual BP on the right forearm per unit protocol. Will continue to monitor pt

## 2014-12-02 DIAGNOSIS — E119 Type 2 diabetes mellitus without complications: Secondary | ICD-10-CM

## 2014-12-02 DIAGNOSIS — D72829 Elevated white blood cell count, unspecified: Secondary | ICD-10-CM

## 2014-12-02 LAB — BASIC METABOLIC PANEL
Anion gap: 6 (ref 5–15)
BUN: 36 mg/dL — ABNORMAL HIGH (ref 6–20)
CALCIUM: 8.2 mg/dL — AB (ref 8.9–10.3)
CO2: 27 mmol/L (ref 22–32)
CREATININE: 1.33 mg/dL — AB (ref 0.61–1.24)
Chloride: 95 mmol/L — ABNORMAL LOW (ref 101–111)
GFR, EST NON AFRICAN AMERICAN: 58 mL/min — AB (ref >60)
Glucose, Bld: 90 mg/dL (ref 65–99)
Potassium: 4.6 mmol/L (ref 3.5–5.1)
SODIUM: 128 mmol/L — AB (ref 135–145)

## 2014-12-02 LAB — CBC
HCT: 29.4 % — ABNORMAL LOW (ref 39.0–52.0)
Hemoglobin: 9.2 g/dL — ABNORMAL LOW (ref 13.0–17.0)
MCH: 24.1 pg — AB (ref 26.0–34.0)
MCHC: 31.3 g/dL (ref 30.0–36.0)
MCV: 77 fL — ABNORMAL LOW (ref 78.0–100.0)
PLATELETS: 201 10*3/uL (ref 150–400)
RBC: 3.82 MIL/uL — AB (ref 4.22–5.81)
RDW: 23.3 % — ABNORMAL HIGH (ref 11.5–15.5)
WBC: 9.2 10*3/uL (ref 4.0–10.5)

## 2014-12-02 LAB — GLUCOSE, CAPILLARY
GLUCOSE-CAPILLARY: 248 mg/dL — AB (ref 65–99)
GLUCOSE-CAPILLARY: 90 mg/dL (ref 65–99)
GLUCOSE-CAPILLARY: 191 mg/dL — AB (ref 65–99)
Glucose-Capillary: 166 mg/dL — ABNORMAL HIGH (ref 65–99)
Glucose-Capillary: 135 mg/dL — ABNORMAL HIGH (ref 65–99)

## 2014-12-02 LAB — URINE CULTURE
Culture: NO GROWTH
SPECIAL REQUESTS: NORMAL

## 2014-12-02 LAB — CARBOXYHEMOGLOBIN
CARBOXYHEMOGLOBIN: 1.7 % — AB (ref 0.5–1.5)
Methemoglobin: 0.9 % (ref 0.0–1.5)
O2 SAT: 48.7 %
Total hemoglobin: 9.1 g/dL — ABNORMAL LOW (ref 13.5–18.0)

## 2014-12-02 MED ORDER — FUROSEMIDE 40 MG PO TABS
40.0000 mg | ORAL_TABLET | Freq: Every day | ORAL | Status: DC
  Administered 2014-12-02 – 2014-12-05 (×4): 40 mg via ORAL
  Filled 2014-12-02 (×4): qty 1

## 2014-12-02 MED ORDER — SPIRONOLACTONE 25 MG PO TABS
12.5000 mg | ORAL_TABLET | Freq: Every day | ORAL | Status: DC
  Administered 2014-12-02 – 2014-12-05 (×4): 12.5 mg via ORAL
  Filled 2014-12-02 (×4): qty 1

## 2014-12-02 MED ORDER — METOPROLOL SUCCINATE ER 25 MG PO TB24
12.5000 mg | ORAL_TABLET | Freq: Two times a day (BID) | ORAL | Status: DC
  Administered 2014-12-02 – 2014-12-05 (×7): 12.5 mg via ORAL
  Filled 2014-12-02 (×7): qty 1

## 2014-12-02 NOTE — Discharge Instructions (Addendum)
°  It is very important that you follow up at the Heart Failure clinic, with your primary care doctor, and also the gastroenterology doctor.  Please make sure you take your medication as instructed. Please follow the new medication list as we changed some of your medications.  Please work on finding a permanent housing by following up with the contact information that was provided by our [employer] administrator.   Finish taking the antibiotics through 12/11/14 for H. Pylori infection.    Information on my medicine - XARELTO (Rivaroxaban) Why was Xarelto prescribed for you? Xarelto was prescribed for you to reduce the risk of a blood clot forming that can cause a stroke if you have a medical condition called atrial fibrillation (a type of irregular heartbeat).  What do you need to know about xarelto ? Take your Xarelto ONCE DAILY at the same time every day with your evening meal. If you have difficulty swallowing the tablet whole, you may crush it and mix in applesauce just prior to taking your dose.  Take Xarelto exactly as prescribed by your doctor and DO NOT stop taking Xarelto without talking to the doctor who prescribed the medication.  Stopping without other stroke prevention medication to take the place of Xarelto may increase your risk of developing a clot that causes a stroke.  Refill your prescription before you run out.  After discharge, you should have regular check-up appointments with your healthcare provider that is prescribing your Xarelto.  In the future your dose may need to be changed if your kidney function or weight changes by a significant amount.  What do you do if you miss a dose? If you are taking Xarelto ONCE DAILY and you miss a dose, take it as soon as you remember on the same day then continue your regularly scheduled once daily regimen the next day. Do not take two doses of Xarelto at the same time or on the same day.   Important Safety  Information A possible side effect of Xarelto is bleeding. You should call your healthcare provider right away if you experience any of the following: ? Bleeding from an injury or your nose that does not stop. ? Unusual colored urine (red or dark brown) or unusual colored stools (red or black). ? Unusual bruising for unknown reasons. ? A serious fall or if you hit your head (even if there is no bleeding).  Some medicines may interact with Xarelto and might increase your risk of bleeding while on Xarelto. To help avoid this, consult your healthcare provider or pharmacist prior to using any new prescription or non-prescription medications, including herbals, vitamins, non-steroidal anti-inflammatory drugs (NSAIDs) and supplements.  This website has more information on Xarelto: VisitDestination.com.br.

## 2014-12-02 NOTE — Progress Notes (Signed)
CARDIAC REHAB PHASE I   PRE:  Rate/Rhythm: 106 ST  BP:  Sitting: 132/80        SaO2: 100 RA  MODE:  Ambulation: 350 ft   POST:  Rate/Rhythm: 105 sT  BP:  Sitting: 153/82         SaO2: 100 RA  Pt ambulated 350 ft on RA, IV, assist x1, fairly steady gait, tolerated well. Pt denies CP, dizziness, DOE, declined rest stop. Did c/o leg pain. Pt states he is supposed to see a doctor about this. Reviewed exercise guidelines, CHF booklet, gave pt heart healthy diet handout, reviewed tobacco cessation and the importance of medication compliance. Pt verbalized understanding, states he will try to quit smoking, however states it is difficult to comply with diet and daily weights due to homelessness. Pt states he has no where to go until 9/16 when he plans to have an apartment. Pt states he plans to live in a borrowed car. 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 Pt states he will not go to a shelter due to exposure to drugs and alcohol, pt denies hx drug or alcohol use.  Emotional support given to pt. Will need review of heart healthy/low sodium diet. Pt to recliner after walk, call bell within reach.   [id number] Joylene Grapes, RN, BSN 12/02/2014 3:25 PM

## 2014-12-02 NOTE — Hospital Discharge Follow-Up (Signed)
Transitional Care Clinic:  Patient known to [facility] at [place]. Met with patient at bedside. Patient aware of follow-up appointment with [facility] on 12/06/14 at 1530 with Dr. Jarold Song, and patient indicates he will be at appointment. Appointment time on AVS.  Patient indicates he will be moving into an apartment after 12/08/14 but does not have a place to stay in the meantime. Per chart review, Ms. Jacob [name] with [employer] [facility] [employer] [facility] has located subsidized housing for patient that will be available after 12/08/14. Discussed with patient that he may need to stay in a shelter or with a friend until housing becomes available. Patient refused staying in a shelter and indicated he had no one to stay with.  Spoke with Elissa Hefty, RN CM who indicated Social Worker has no other resources for patient at this time.  Elissa Hefty, RN CM updated on follow-up appointment at [facility] on 12/06/14. She was appreciative of information and update.

## 2014-12-02 NOTE — Progress Notes (Signed)
Subjective: Patient seen and examined at bedside.  Doing well without complaints today.  Denies chest pain, shortness of breath, dizziness.  States had bowel movement and did not notice any melena or blood.  RIJ removed last night and replaced with PICC in right arm.  Objective: Vital signs in last 24 hours: Filed Vitals:   12/02/14 0817 12/02/14 0920 12/02/14 1152 12/02/14 1434  BP: 99/57  89/51 132/80  Pulse: 107 105 94 101  Temp: 98.4 F (36.9 C)  97.7 F (36.5 C)   TempSrc: Oral  Oral   Resp: 24  15   Height:      Weight:      SpO2: 100%  100%    Weight change: -1 lb 13.8 oz (-0.844 kg)  Intake/Output Summary (Last 24 hours) at 12/02/14 1529 Last data filed at 12/02/14 0845  Gross per 24 hour  Intake 829.55 ml  Output   1150 ml  Net -320.45 ml   General: sitting up in chair, no distress HEENT: EOMI, right IJ removed. Cardiac: irregular rate, irregular rhythm, mildly tachcardic Pulm: normal work of breathing, clear to auscultation Abd: soft, nontender, nondistended, BS present Ext: PICC line in R arm, warm and well perfused, lower extremity edema improved Neuro: alert and oriented X3  Lab Results: Basic Metabolic Panel:  Recent Labs Lab 12/01/14 0230 12/02/14 0516  NA 126* 128*  K 4.6 4.6  CL 93* 95*  CO2 23 27  GLUCOSE 181* 90  BUN 47* 36*  CREATININE 1.55* 1.33*  CALCIUM 8.2* 8.2*   CBC:  Recent Labs Lab 12/01/14 1230 12/02/14 0516  WBC 13.9* 9.2  HGB 9.7* 9.2*  HCT 31.1* 29.4*  MCV 76.0* 77.0*  PLT 290 201   CBG:  Recent Labs Lab 12/01/14 0812 12/01/14 1153 12/01/14 1644 12/01/14 2200 12/02/14 0817 12/02/14 1154  GLUCAP 292* 179* 76 248* 90 166*    Medications: Scheduled Meds: . amoxicillin  1,000 mg Oral BID  . clarithromycin  500 mg Oral BID  . digoxin  0.0625 mg Oral Daily  . furosemide  40 mg Oral Daily  . insulin aspart  0-20 Units Subcutaneous TID WC  . insulin aspart  0-5 Units Subcutaneous QHS  . insulin aspart  3  Units Subcutaneous TID WC  . insulin glargine  10 Units Subcutaneous BID  . metoprolol succinate  12.5 mg Oral BID  . pantoprazole  40 mg Oral BID  . rivaroxaban  20 mg Oral Daily  . sodium chloride  250 mL Intravenous Once  . sodium chloride  10-40 mL Intracatheter Q12H  . sodium chloride  3 mL Intravenous Q12H  . spironolactone  12.5 mg Oral Daily   Continuous Infusions: . sodium chloride 10 mL/hr at 12/01/14 0700  . norepinephrine (LEVOPHED) Adult infusion Stopped (12/01/14 1952)   PRN Meds:.albuterol, ALPRAZolam, ondansetron **OR** ondansetron (ZOFRAN) IV, oxyCODONE, sodium chloride Assessment/Plan: Active Problems:   Essential hypertension   CAD- s/p CABG July 2014 [facility]   DM (diabetes mellitus), type 2 with peripheral vascular complications   Anemia of chronic disease   Atrial fibrillation with rapid ventricular response   Homelessness   Acute on chronic heart failure   Atrial fibrillation, unspecified   Acute on chronic combined systolic and diastolic CHF (congestive heart failure)   Acute on chronic systolic congestive heart failure   Acute respiratory distress   Cardiogenic shock   GI bleed   Acute blood loss anemia   Hemorrhagic shock   Duodenal ulcer with hemorrhage  59 yo male with systolic CHF, DM II, HTN, CAD s/p CABG, afib on anticoag, here with CHF exacerbation.  GI bleed - EGD with finding of large, bleeding duodenal ulcer.  Treated with epinephrine and clipping.   -GI following, appreciate their recommendations -resume Xarelto 9/8 -Protonix 40mg  PO bid -Hgb stable 9.2 -Serum H. Pylori positive.  Antibiotics per GI: Biaxin and amoxicillin x 10 days starting on 9/7 -monitor CBC with transfusion threshold <8  Cardiogenic shock from Acute on chronic systolic CHF - ischemic cardiomyopathy -heart failure team following and appreciate their recommendations. -CO-OX today at 49, CVP 10.   -off Levophed.   -Digoxin 0.125 daily, restart Spironolactone  12.5 daily and Lasix 40mg  po daily -PICC placed yesterday  Atrial fibrillation with RVR - CHADSVASC2 of 4. -Stop amiodarone -resume xarelto  -Start Toprol XL 12.5mg  PO bid -f/u arterial doppler evaluation of subclavians per Dr. Kathlyn Sacramento note.  Leukocytosis -down from 18.7 to 9 today, no evidence of current infection -blood cx x2 NGTD, urine cx NGTD, catheter tip cx in process  Diabetes mellitus type II -stable -sliding scale insulin + meal time + qhs coverage.  -Lantus bid -hemoglobin A1c 8.1 in August 2016 -Dietician consult  CAD s/p CABG  -holding asa for now b/c of GI bleed  Hyperlipidemia -holding atorvastatin  Homelessness - appreciate CHF team's effort in arranging housing for him.  Dispo: Disposition is deferred at this time, awaiting improvement of current medical problems.    The patient does have a current PCP ([name], MD) and does not need an [facility] hospital follow-up appointment after discharge.  The patient does have transportation limitations that hinder transportation to clinic appointments.    LOS: 9 days   [name], DO 12/02/2014, 3:29 PM

## 2014-12-02 NOTE — Progress Notes (Signed)
Advanced Heart Failure Rounding Note   Subjective:    Transferred to ICU on 8/31 --> cardiogenic shock. He was also back in A fib so he was loaded on amio. Central line placed. Initial CO-OX 44% and CVP 27. Started on norepi due to hypotension and diuresed with IV lasix.   9/1  Milrinone 0.25 mcg, Amio increased to 60 mg per hour,  norepi was weaned off. Brisk diuresis noted.  9/2 Milrinone 0.25 mcg+ Amio 60 mg per hour+ Norepi 5 mcg.  9/4 Developed GIB. Received 3 units RBCs. Xarelto stopped  9/5 EGD- Duodenal Bleed- Clip applied.  Milrinone 0.125 mcg + levo 6 mcg+ amio  9/6- Hgb 8.4 CO-OX 70% Milrinone stopped with adequate CO-OX (65% off milrinone) . Transitioned to po amiodarone.  Transitioned to torsemide.   9/7 Hypotensive. 250 NS bolus. Started back norepi. No diuretic.  9/8 ff levo. received 250 NS. WBC 18.7>9.2 UA Ok. Blood cultures pending. RUE PICC   Denies SOB/Orthopnea. No dizziness. NO BMS over night.   Objective:   Weight Range:  Vital Signs:   Temp:  [98 F (36.7 C)-98.5 F (36.9 C)] 98 F (36.7 C) (09/09 0518) Pulse Rate:  [98-107] 107 (09/08 1200) Resp:  [12-24] 14 (09/09 0518) BP: (79-150)/(45-110) 113/67 mmHg (09/09 0518) SpO2:  [96 %-100 %] 100 % (09/09 0518) Weight:  [200 lb 13.4 oz (91.1 kg)] 200 lb 13.4 oz (91.1 kg) (09/09 0500) Last BM Date: 12/01/14  Weight change: Filed Weights   11/30/14 0340 12/01/14 0500 12/02/14 0500  Weight: 201 lb 8 oz (91.4 kg) 202 lb 11.2 oz (91.944 kg) 200 lb 13.4 oz (91.1 kg)    Intake/Output:   Intake/Output Summary (Last 24 hours) at 12/02/14 0740 Last data filed at 12/02/14 0000  Gross per 24 hour  Intake 2027.03 ml  Output   1900 ml  Net 127.03 ml     Physical Exam: CVP 10  General:  Sitting in chair.   No resp difficulty HEENT: normal.  Neck: supple.  JVP 8-9  Carotids 2+ bilat; no bruits. No lymphadenopathy or thryomegaly appreciated. Cor: PMI nondisplaced. Irregular rate & rhythm. No rubs, or murmurs.+  S3  Lungs: clear Abdomen: soft, nontender, nondistended. No hepatosplenomegaly. No bruits or masses. Good bowel sounds. Extremities: no cyanosis, clubbing, rash, R and LLE . RUE PICC  Neuro: alert & orientedx3, cranial nerves grossly intact. moves all 4 extremities w/o difficulty. Affect pleasant GU:  foley   Telemetry: A fib 90s   Labs: Basic Metabolic Panel:  Recent Labs Lab 11/29/14 0537 11/30/14 0345 11/30/14 1843 12/01/14 0230 12/02/14 0516  NA 131* 128* 129* 126* 128*  K 4.3 4.1 4.3 4.6 4.6  CL 100* 92* 95* 93* 95*  CO2 23 26 26 23 27   GLUCOSE 121* 171* 98 181* 90  BUN 46* 40* 47* 47* 36*  CREATININE 1.27* 1.53* 1.74* 1.55* 1.33*  CALCIUM 8.6* 8.3* 8.0* 8.2* 8.2*    Liver Function Tests: No results for input(s): AST, ALT, ALKPHOS, BILITOT, PROT, ALBUMIN in the last 168 hours. No results for input(s): LIPASE, AMYLASE in the last 168 hours. No results for input(s): AMMONIA in the last 168 hours.  CBC:  Recent Labs Lab 11/30/14 0345 11/30/14 1843 12/01/14 0230 12/01/14 1230 12/02/14 0516  WBC 12.8* 14.0* 18.7* 13.9* 9.2  HGB 9.3* 9.0* 9.7* 9.7* 9.2*  HCT 29.8* 29.1* 31.1* 31.1* 29.4*  MCV 76.4* 76.6* 76.4* 76.0* 77.0*  PLT 246 251 293 290 201    Cardiac Enzymes: No  results for input(s): CKTOTAL, CKMB, CKMBINDEX, TROPONINI in the last 168 hours.  BNP: BNP (last 3 results)  Recent Labs  10/29/14 1540 11/11/14 1833 11/23/14 0328  BNP 1232.4* 807.3* 866.8*    ProBNP (last 3 results)  Recent Labs  09/06/14 1107 09/20/14 1201 10/04/14 1256  PROBNP 1577.00* 3006.00* 2524.00*      Other results:  Imaging: Dg Chest Port 1 View  12/01/2014   CLINICAL DATA:  Central catheter placement  EXAM: PORTABLE CHEST - 1 VIEW  COMPARISON:  November 23, 2014  FINDINGS: New central catheter tip is in the superior vena cava near the cavoatrial junction. Previously placed right jugular catheter tip is also in the superior vena cava much more proximally. No  pneumothorax. No edema or consolidation. Heart is upper normal in size with pulmonary vascularity within normal limits. Pacemaker lead is attached to the right ventricle. No adenopathy.  IMPRESSION: New central catheter tip in superior vena cava. Right jugular catheter tip is in the proximal superior vena cava region. No pneumothorax. No edema or consolidation.   Electronically Signed   By: Bretta Bang III M.D.   On: 12/01/2014 19:39     Medications:     Scheduled Medications: . alteplase  2 mg Intracatheter Once  . amiodarone  200 mg Oral BID  . amoxicillin  1,000 mg Oral BID  . clarithromycin  500 mg Oral BID  . digoxin  0.0625 mg Oral Daily  . insulin aspart  0-20 Units Subcutaneous TID WC  . insulin aspart  0-5 Units Subcutaneous QHS  . insulin aspart  3 Units Subcutaneous TID WC  . insulin glargine  10 Units Subcutaneous BID  . pantoprazole  40 mg Oral BID  . potassium chloride  20 mEq Oral BID  . rivaroxaban  20 mg Oral Daily  . sodium chloride  250 mL Intravenous Once  . sodium chloride  10-40 mL Intracatheter Q12H  . sodium chloride  3 mL Intravenous Q12H    Infusions: . sodium chloride 10 mL/hr at 12/01/14 0700  . norepinephrine (LEVOPHED) Adult infusion Stopped (12/01/14 1952)    PRN Medications: albuterol, ALPRAZolam, ondansetron **OR** ondansetron (ZOFRAN) IV, oxyCODONE, sodium chloride   Assessment:   1. Acute on chronic systolic CHF, ICM EF 20-25% with poor RV function, NYHA class IV -> Cardiogenic shock.  [employer] ICD.  2. Acute hypoxic respiratory failure 3. Chronic Afib RVR - no anticoagulation with GI bleed.s 4. 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 DM type II 6. Homelessness 7. Hypotension  8. AKI 9. Hyponatremia 10. Hypomagnesium 11. Acute GI bleed with blood loss anemia 12 H Pylori   Plan/Discussion:    9/5 S/P EGD- Duodenal Bleed with clot note. Clip placed. Hgb stable 9.7. today. On PPI. Will need PPI twice a day 2-3 months then  daily per GI. Xarelto restarted 9/8.  Hgb 9.7>9.2. Watch closely. No evidence of bleeding.    Milrinone stopped 9/6 with stable . Levo stopped. Todays CO-OX 49%. Continue  dig 0.125 mg daily.  No BB with low output. Volume status ok. Start 12.5 spiro. Stop potassium. Hold off on lisinopril. May be able to start tomorrow.  Renal function stable.   Remains in A fib but rate better controlled. Continue amio 200 mg twice a day.On Xarelto   WBC back down 18>9.2. UA ok. Blood cultures pending. PICC line removed tip culture pending. On amoxicillin for H Pylori.   He is not a candidate for home inotropes due to social situation.  CLEGG,AMY NP-C  7:40 AM Advanced Heart Failure Team Pager [phone] (M-F; 7a - 4p)  Please contact [facility] for night-coverage after hours (4p -7a ) and weekends on [email or web address]  Patient seen with NP, agree with the above note.  BP stable today.  CVP 10 and co-ox 49%.  He feels well, no dyspnea.  Though co-ox is low, he is not going to be a candidate for home inotropes (homeless) or advanced therapies.  - Continue digoxin. No need for pressors at this point.   Atrial fibrillation with HR in 100s.  On Xarelto.  He has chronic atrial fibrillation and will not be cardioverted.  Therefore, would stop amiodarone and start Toprol XL 12.5 mg bid.  If tolerates this and HR stays high, can titrate up carefully.   Mild volume overload today.  Will start Lasix 40 mg po daily and spironolactone 12.5 daily.    No evidence for current infection.   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   Marca Ancona 12/02/2014 9:44 AM

## 2014-12-03 ENCOUNTER — Inpatient Hospital Stay (HOSPITAL_COMMUNITY): Payer: Medicaid Other

## 2014-12-03 LAB — GLUCOSE, CAPILLARY
GLUCOSE-CAPILLARY: 174 mg/dL — AB (ref 65–99)
Glucose-Capillary: 86 mg/dL (ref 65–99)
Glucose-Capillary: 176 mg/dL — ABNORMAL HIGH (ref 65–99)
Glucose-Capillary: 171 mg/dL — ABNORMAL HIGH (ref 65–99)

## 2014-12-03 LAB — BASIC METABOLIC PANEL
Anion gap: 7 (ref 5–15)
BUN: 32 mg/dL — ABNORMAL HIGH (ref 6–20)
CALCIUM: 8.4 mg/dL — AB (ref 8.9–10.3)
CO2: 27 mmol/L (ref 22–32)
CREATININE: 1.34 mg/dL — AB (ref 0.61–1.24)
Chloride: 94 mmol/L — ABNORMAL LOW (ref 101–111)
GFR, EST NON AFRICAN AMERICAN: 57 mL/min — AB (ref >60)
GLUCOSE: 98 mg/dL (ref 65–99)
Potassium: 4.5 mmol/L (ref 3.5–5.1)
Sodium: 128 mmol/L — ABNORMAL LOW (ref 135–145)

## 2014-12-03 LAB — CBC
HCT: 29.3 % — ABNORMAL LOW (ref 39.0–52.0)
Hemoglobin: 8.9 g/dL — ABNORMAL LOW (ref 13.0–17.0)
MCH: 23.2 pg — AB (ref 26.0–34.0)
MCHC: 30.4 g/dL (ref 30.0–36.0)
MCV: 76.5 fL — AB (ref 78.0–100.0)
PLATELETS: 216 10*3/uL (ref 150–400)
RBC: 3.83 MIL/uL — ABNORMAL LOW (ref 4.22–5.81)
RDW: 22.9 % — AB (ref 11.5–15.5)
WBC: 7.7 10*3/uL (ref 4.0–10.5)

## 2014-12-03 LAB — CARBOXYHEMOGLOBIN
CARBOXYHEMOGLOBIN: 1.8 % — AB (ref 0.5–1.5)
METHEMOGLOBIN: 0.9 % (ref 0.0–1.5)
O2 SAT: 47.2 %
Total hemoglobin: 9 g/dL — ABNORMAL LOW (ref 13.5–18.0)

## 2014-12-03 LAB — DIGOXIN LEVEL: Digoxin Level: 0.2 ng/mL — ABNORMAL LOW (ref 0.8–2.0)

## 2014-12-03 MED ORDER — CLARITHROMYCIN 500 MG PO TABS: 500.0000 mg | ORAL_TABLET | Freq: Two times a day (BID) | ORAL | Status: DC

## 2014-12-03 MED ORDER — DIGOXIN 62.5 MCG PO TABS
0.0625 mg | ORAL_TABLET | Freq: Every day | ORAL | Status: DC
Start: 2014-12-03 — End: 2014-12-05

## 2014-12-03 MED ORDER — METOPROLOL SUCCINATE ER 25 MG PO TB24: 12.5000 mg | ORAL_TABLET | Freq: Two times a day (BID) | ORAL | Status: DC

## 2014-12-03 MED ORDER — PANTOPRAZOLE SODIUM 40 MG PO TBEC: 40.0000 mg | DELAYED_RELEASE_TABLET | Freq: Two times a day (BID) | ORAL | Status: DC

## 2014-12-03 MED ORDER — FUROSEMIDE 40 MG PO TABS: 40.0000 mg | ORAL_TABLET | Freq: Every day | ORAL | Status: DC

## 2014-12-03 MED ORDER — SPIRONOLACTONE 25 MG PO TABS: 12.5000 mg | ORAL_TABLET | Freq: Every day | ORAL | Status: DC

## 2014-12-03 MED ORDER — ATORVASTATIN CALCIUM 40 MG PO TABS
40.0000 mg | ORAL_TABLET | Freq: Every day | ORAL | Status: DC
  Administered 2014-12-03 – 2014-12-04 (×2): 40 mg via ORAL
  Filled 2014-12-03: qty 2
  Filled 2014-12-03: qty 1

## 2014-12-03 MED ORDER — AMOXICILLIN 500 MG PO CAPS: 1000.0000 mg | ORAL_CAPSULE | Freq: Two times a day (BID) | ORAL | Status: DC

## 2014-12-03 NOTE — Progress Notes (Signed)
Pt informed RN he talked to his religious leader at the [facility], and does not want to leave Hospital until Monday. Teaching Service paged.

## 2014-12-03 NOTE — Progress Notes (Signed)
CARDIAC REHAB PHASE I   PRE:  Rate/Rhythm: afib 87  BP:  Supine:   Sitting: 111/47  Standing:    SaO2: 100 RA  MODE:  Ambulation: 350 ft   POST:  Rate/Rhythm: 102 BP:  Supine:   Sitting: 139/71  Standing:    SaO2: 100 RA  Ambulated x 1 assist 350 ft in hallway.  Pt tolerated well with no complaints of SOB or distress.  Pt back to chair, call bell in place. Denies any further needs. [phone] Karlene Lineman RN, BSN

## 2014-12-03 NOTE — Progress Notes (Signed)
Subjective: Doing well. Denies any complaints. Vitals ok without pressors. Wants to go home. States that he will stay with a friend for few days in a motel until he finds an apartment which CHF team has been trying to arrange for him through (subsidized housing, which will be available on 12/08/14) . No bleeding.  Objective: Vital signs in last 24 hours: Filed Vitals:   12/02/14 2300 12/03/14 0336 12/03/14 0758 12/03/14 0800  BP: 105/71 104/71 108/58 108/58  Pulse: 90  85   Temp: 98.3 F (36.8 C) 97.7 F (36.5 C) 98.2 F (36.8 C)   TempSrc: Oral Oral Oral   Resp: 11 11 16 31   Height:      Weight:  203 lb 14.8 oz (92.5 kg)    SpO2: 100% 100% 100%    Weight change: 3 lb 1.4 oz (1.4 kg)  Intake/Output Summary (Last 24 hours) at 12/03/14 1112 Last data filed at 12/03/14 1000  Gross per 24 hour  Intake   1145 ml  Output   1700 ml  Net   -555 ml   General: sitting up in chair, no distress HEENT: EOMI, perrla Cardiac: irregular rate, irregular rhythm Pulm: normal work of breathing, clear to auscultation Abd: soft, nontender, nondistended, BS present Ext: PICC line in R arm, warm and well perfused, lower extremity edema improved Neuro: alert and oriented X3  Lab Results: Basic Metabolic Panel:  Recent Labs Lab 12/02/14 0516 12/03/14 0415  NA 128* 128*  K 4.6 4.5  CL 95* 94*  CO2 27 27  GLUCOSE 90 98  BUN 36* 32*  CREATININE 1.33* 1.34*  CALCIUM 8.2* 8.4*   CBC:  Recent Labs Lab 12/02/14 0516 12/03/14 0415  WBC 9.2 7.7  HGB 9.2* 8.9*  HCT 29.4* 29.3*  MCV 77.0* 76.5*  PLT 201 216   CBG:  Recent Labs Lab 12/01/14 2200 12/02/14 0817 12/02/14 1154 12/02/14 1711 12/02/14 2110 12/03/14 0806  GLUCAP 248* 90 166* 135* 191* 86    Medications: Scheduled Meds: . amoxicillin  1,000 mg Oral BID  . clarithromycin  500 mg Oral BID  . digoxin  0.0625 mg Oral Daily  . furosemide  40 mg Oral Daily  . insulin aspart  0-20 Units Subcutaneous TID WC  .  insulin aspart  0-5 Units Subcutaneous QHS  . insulin aspart  3 Units Subcutaneous TID WC  . insulin glargine  10 Units Subcutaneous BID  . metoprolol succinate  12.5 mg Oral BID  . pantoprazole  40 mg Oral BID  . rivaroxaban  20 mg Oral Daily  . sodium chloride  250 mL Intravenous Once  . sodium chloride  10-40 mL Intracatheter Q12H  . sodium chloride  3 mL Intravenous Q12H  . spironolactone  12.5 mg Oral Daily   Continuous Infusions: . sodium chloride 10 mL/hr at 12/01/14 0700  . norepinephrine (LEVOPHED) Adult infusion Stopped (12/01/14 1952)   PRN Meds:.albuterol, ALPRAZolam, ondansetron **OR** ondansetron (ZOFRAN) IV, oxyCODONE, sodium chloride Assessment/Plan: Active Problems:   Essential hypertension   CAD- s/p CABG July 2014 [facility]   DM (diabetes mellitus), type 2 with peripheral vascular complications   Anemia of chronic disease   Atrial fibrillation with rapid ventricular response   Homelessness   Acute on chronic heart failure   Atrial fibrillation, unspecified   Acute on chronic combined systolic and diastolic CHF (congestive heart failure)   Acute on chronic systolic congestive heart failure   Acute respiratory distress   Cardiogenic shock   GI  bleed   Acute blood loss anemia   Hemorrhagic shock   Duodenal ulcer with hemorrhage  59 yo male with systolic CHF, DM II, HTN, CAD s/p CABG, afib on anticoag, here with CHF exacerbation.  Cardiogenic shock from Acute on chronic systolic CHF - ischemic cardiomyopathy -heart failure team following and appreciate their recommendations. -CO-OX today at 47, CVP 6.  -off Levophed.   -Digoxin 0.0625 mg daily, Spironolactone 12.5 daily and Lasix  po daily - f/up CHF team recs.   GI bleed - EGD with finding of large, bleeding duodenal ulcer.  Treated with epinephrine and clipping.   -back on xarelto now since 9/8, no bleeding on it -s/p protonix GTT >> Protonix  PO bid 9/9. -Hgb stable 8.9 -Serum H. Pylori  positive.  Antibiotics per GI: Biaxin and amoxicillin x 10 days starting on 9/7 >> 12/11/14   Atrial fibrillation with RVR - CHADSVASC2 of 4. -Stop amiodarone >> Toprol 12.5mg  PO BID -cont xarelto  -f/u arterial doppler evaluation of subclavians per Dr. Kathlyn [place] note.  Leukocytosis - resolved after taking out RIJ cath, no evidence of current infection -blood cx x2 NGTD, urine cx NGTD, catheter tip cx NGTD  Diabetes mellitus type II -stable -sliding scale insulin + meal time + qhs coverage.  -Lantus bid -hemoglobin A1c 8.1 in August 2016 -Dietician consult  CAD s/p CABG  -holding asa for now b/c of GI bleed  Hyperlipidemia - on lipitor  Homelessness - appreciate CHF team's effort in arranging housing for him.  Dispo: Disposition is deferred at this time, awaiting improvement of current medical problems.    The patient does have a current PCP ([name], MD) and does not need an [facility] hospital follow-up appointment after discharge.  The patient does have transportation limitations that hinder transportation to clinic appointments.    LOS: 10 days   Hyacinth Meeker, MD 12/03/2014, 11:12 AM

## 2014-12-03 NOTE — Progress Notes (Signed)
Talked to patient after Dr. Johney Frame let me know that patient wants to leave today despite our recommendation to stay until at least Monday to get him assistance with housing. He told me that he will stay in a motel and a friend will help him. He does not want to stay in the hospital at all.

## 2014-12-03 NOTE — Progress Notes (Signed)
SUBJECTIVE: The patient is doing well today.  At this time, he denies chest pain, shortness of breath, or any new concerns.  Jacob [name] amoxicillin  1,000 mg Oral BID  . atorvastatin  40 mg Oral q1800  . clarithromycin  500 mg Oral BID  . digoxin  0.0625 mg Oral Daily  . furosemide  40 mg Oral Daily  . insulin aspart  0-20 Units Subcutaneous TID WC  . insulin aspart  0-5 Units Subcutaneous QHS  . insulin aspart  3 Units Subcutaneous TID WC  . insulin glargine  10 Units Subcutaneous BID  . metoprolol succinate  12.5 mg Oral BID  . pantoprazole  40 mg Oral BID  . rivaroxaban  20 mg Oral Daily  . sodium chloride  250 mL Intravenous Once  . sodium chloride  10-40 mL Intracatheter Q12H  . sodium chloride  3 mL Intravenous Q12H  . spironolactone  12.5 mg Oral Daily   . sodium chloride 10 mL/hr at 12/01/14 0700  . norepinephrine (LEVOPHED) Adult infusion Stopped (12/01/14 1952)    OBJECTIVE: Physical Exam: Filed Vitals:   12/03/14 0336 12/03/14 0758 12/03/14 0800 12/03/14 1238  BP: 104/71 108/58 108/58 97/62  Pulse:  85  89  Temp: 97.7 F (36.5 C) 98.2 F (36.8 C)  98 F (36.7 C)  TempSrc: Oral Oral  Oral  Resp: 11 16 31 15   Height:      Weight: 92.5 kg (203 lb 14.8 oz)     SpO2: 100% 100%  100%    Intake/Output Summary (Last 24 hours) at 12/03/14 1456 Last data filed at 12/03/14 1100  Gross per 24 hour  Intake    905 ml  Output   1800 ml  Net   -895 ml    Telemetry reveals sinus rhythm  GEN- The patient is chronically ill appearing, alert and oriented x 3 today.   Head- normocephalic, atraumatic Eyes-  Sclera clear, conjunctiva pink Ears- hearing intact Oropharynx- clear Neck- supple,   Lungs- Clear to ausculation bilaterally, normal work of breathing Heart- Regular rate and rhythm  GI- soft, NT, ND, + BS Extremities- no clubbing, cyanosis, + dependant edema Skin- no rash or lesion Psych- euthymic mood, full affect Neuro- strength and sensation are  intact  LABS: Basic Metabolic Panel:  Recent Labs  16/60/63 0516 12/03/14 0415  NA 128* 128*  K 4.6 4.5  CL 95* 94*  CO2 27 27  GLUCOSE 90 98  BUN 36* 32*  CREATININE 1.33* 1.34*  CALCIUM 8.2* 8.4*   Liver Function Tests: No results for input(s): AST, ALT, ALKPHOS, BILITOT, PROT, ALBUMIN in the last 72 hours. No results for input(s): LIPASE, AMYLASE in the last 72 hours. CBC:  Recent Labs  12/02/14 0516 12/03/14 0415  WBC 9.2 7.7  HGB 9.2* 8.9*  HCT 29.4* 29.3*  MCV 77.0* 76.5*  PLT 201 216    ASSESSMENT AND PLAN:   1. Acute on chronic systolic CHF, ICM EF 20-25% with poor RV function, NYHA class IV -> Cardiogenic shock.now improved  Salt restriction Continue current medicines  Given poor social situation, he is at very high risk for readmission/ decompensation.  I have advised that he stay in the hospital to work with SW to find a better living alternative. He is clear that due to muslim holiday that he is leaving at this time.  Follow-up in the CHF clinic in 1 week         Hillis Range, MD 12/03/2014 2:56 PM

## 2014-12-04 ENCOUNTER — Inpatient Hospital Stay (HOSPITAL_COMMUNITY): Payer: Medicaid Other

## 2014-12-04 DIAGNOSIS — R6889 Other general symptoms and signs: Secondary | ICD-10-CM

## 2014-12-04 DIAGNOSIS — I10 Essential (primary) hypertension: Secondary | ICD-10-CM

## 2014-12-04 DIAGNOSIS — K5900 Constipation, unspecified: Secondary | ICD-10-CM

## 2014-12-04 LAB — CARBOXYHEMOGLOBIN
CARBOXYHEMOGLOBIN: 1.9 % — AB (ref 0.5–1.5)
Methemoglobin: 0.8 % (ref 0.0–1.5)
O2 SAT: 56.8 %
TOTAL HEMOGLOBIN: 8.6 g/dL — AB (ref 13.5–18.0)

## 2014-12-04 LAB — BASIC METABOLIC PANEL
Anion gap: 10 (ref 5–15)
BUN: 30 mg/dL — AB (ref 6–20)
CALCIUM: 8.6 mg/dL — AB (ref 8.9–10.3)
CO2: 30 mmol/L (ref 22–32)
CREATININE: 1.28 mg/dL — AB (ref 0.61–1.24)
Chloride: 93 mmol/L — ABNORMAL LOW (ref 101–111)
GFR calc non Af Amer: >60 mL/min (ref >60)
Glucose, Bld: 95 mg/dL (ref 65–99)
Potassium: 3.7 mmol/L (ref 3.5–5.1)
SODIUM: 133 mmol/L — AB (ref 135–145)

## 2014-12-04 LAB — CATH TIP CULTURE: CULTURE: NO GROWTH

## 2014-12-04 LAB — GLUCOSE, CAPILLARY
GLUCOSE-CAPILLARY: 109 mg/dL — AB (ref 65–99)
GLUCOSE-CAPILLARY: 212 mg/dL — AB (ref 65–99)
GLUCOSE-CAPILLARY: 178 mg/dL — AB (ref 65–99)
Glucose-Capillary: 95 mg/dL (ref 65–99)

## 2014-12-04 MED ORDER — SENNOSIDES-DOCUSATE SODIUM 8.6-50 MG PO TABS
1.0000 | ORAL_TABLET | Freq: Every day | ORAL | Status: DC
  Administered 2014-12-04: 1 via ORAL
  Filled 2014-12-04 (×2): qty 1

## 2014-12-04 NOTE — Progress Notes (Addendum)
   SUBJECTIVE: The patient is doing well today.  Willing to stay in hospital until tomorrow.  At this time, he denies chest pain, shortness of breath, or any new concerns.  Marland Kitchen amoxicillin  1,000 mg Oral BID  . atorvastatin  40 mg Oral q1800  . clarithromycin  500 mg Oral BID  . digoxin  0.0625 mg Oral Daily  . furosemide  40 mg Oral Daily  . insulin aspart  0-20 Units Subcutaneous TID WC  . insulin aspart  0-5 Units Subcutaneous QHS  . insulin aspart  3 Units Subcutaneous TID WC  . insulin glargine  10 Units Subcutaneous BID  . metoprolol succinate  12.5 mg Oral BID  . pantoprazole  40 mg Oral BID  . rivaroxaban  20 mg Oral Daily  . senna-docusate  1 tablet Oral QHS  . sodium chloride  250 mL Intravenous Once  . sodium chloride  10-40 mL Intracatheter Q12H  . sodium chloride  3 mL Intravenous Q12H  . spironolactone  12.5 mg Oral Daily   . sodium chloride 10 mL/hr at 12/01/14 0700  . norepinephrine (LEVOPHED) Adult infusion Stopped (12/01/14 1952)    OBJECTIVE: Physical Exam: Filed Vitals:   12/03/14 2349 12/04/14 0331 12/04/14 0748 12/04/14 0914  BP: 91/66 133/81 139/58 113/44  Pulse:   80 93  Temp:  97.9 F (36.6 C) 97.4 F (36.3 C)   TempSrc:  Oral Axillary   Resp: 17 16 17    Height:      Weight:      SpO2:  100%      Intake/Output Summary (Last 24 hours) at 12/04/14 1119 Last data filed at 12/04/14 1000  Gross per 24 hour  Intake    360 ml  Output   1350 ml  Net   -990 ml    Telemetry reveals sinus rhythm  GEN- The patient is chronically ill appearing, alert and oriented x 3 today.   Head- normocephalic, atraumatic Eyes-  Sclera clear, conjunctiva pink Ears- hearing intact Oropharynx- clear Neck- supple,   Lungs- Clear to ausculation bilaterally, normal work of breathing Heart- Regular rate and rhythm  GI- soft, NT, ND, + BS Extremities- no clubbing, cyanosis, + dependant edema Skin- no rash or lesion Psych- euthymic mood, full affect Neuro- strength  and sensation are intact  LABS: Basic Metabolic Panel:  Recent Labs  76/81/15 0415 12/04/14 0314  NA 128* 133*  K 4.5 3.7  CL 94* 93*  CO2 27 30  GLUCOSE 98 95  BUN 32* 30*  CREATININE 1.34* 1.28*  CALCIUM 8.4* 8.6*   CBC:  Recent Labs  12/02/14 0516 12/03/14 0415  WBC 9.2 7.7  HGB 9.2* 8.9*  HCT 29.4* 29.3*  MCV 77.0* 76.5*  PLT 201 216    ASSESSMENT AND PLAN:   1. Acute on chronic systolic CHF, ICM EF 20-25% with poor RV function, NYHA class IV -> Cardiogenic shock.now improved  Salt restriction Continue current medicines  Given poor social situation, he is at very high risk for readmission/ decompensation.  I have advised that he stay in the hospital to work with SW to find a better living alternative. He has agreed to stay until Monday  2. Atrial fibrillation Continue xarelto and rate control  3. HTN Stable No change required today  No changes today Transfer to telemetry        Hillis Range, MD 12/04/2014 11:19 AM

## 2014-12-04 NOTE — Progress Notes (Signed)
VASCULAR LAB PRELIMINARY  PRELIMINARY  PRELIMINARY  PRELIMINARY  Carotid duplex completed.    Preliminary report:  Significant calcific plaque throughout bilateral carotid arteries. 1-39% right ICA stenosis.  40-59% left ICA stenosis.  Vertebral artery flow is antegrade.   Niraj Kudrna, RVT 12/04/2014, 4:33 PM

## 2014-12-04 NOTE — Progress Notes (Signed)
Subjective: Doing well today, only complaint is states he needs to have a bowel movement since last one was about 4 days ago.  Agreeable to staying in hospital a little longer so that he will be safe to discharge and have access to prescriptions.  States he will stay in motel at discharge and then go to apartment that social work has arranged for him.  Denies any chest pain, shortness of breath, no signs of bleeding.  Vitals doing well without pressors.  Objective: Vital signs in last 24 hours: Filed Vitals:   12/03/14 2349 12/04/14 0331 12/04/14 0748 12/04/14 0914  BP: 91/66 133/81 139/58 113/44  Pulse:   80 93  Temp:  97.9 F (36.6 C) 97.4 F (36.3 C)   TempSrc:  Oral Axillary   Resp: Height:      Weight:      SpO2:  100%     Weight change:   Intake/Output Summary (Last 24 hours) at 12/04/14 0921 Last data filed at 12/04/14 0655  Gross per 24 hour  Intake    600 ml  Output   1325 ml  Net   -725 ml   General: sitting up in chair, eating breakfast, no distress HEENT: EOMI, no pallor Cardiac: irregular rate, irregular rhythm, not tachycardic Pulm: normal work of breathing, clear to auscultation Abd: soft, nontender, nondistended, BS present Ext: PICC line in R arm, warm and well perfused, lower extremity edema is better Neuro: alert and oriented X3  Lab Results: Basic Metabolic Panel:  Recent Labs Lab 12/03/14 0415 12/04/14 0314  NA 128* 133*  K 4.5 3.7  CL 94* 93*  CO2 27 30  GLUCOSE 98 95  BUN 32* 30*  CREATININE 1.34* 1.28*  CALCIUM 8.4* 8.6*   CBC:  Recent Labs Lab 12/02/14 0516 12/03/14 0415  WBC 9.2 7.7  HGB 9.2* 8.9*  HCT 29.4* 29.3*  MCV 77.0* 76.5*  PLT 201 216   CBG:  Recent Labs Lab 12/02/14 2110 12/03/14 0806 12/03/14 1240 12/03/14 1713 12/03/14 2130 12/04/14 0840  GLUCAP 191* 86 176* 171* 174* 109*    Medications: Scheduled Meds: . amoxicillin  1,000 mg Oral BID  . atorvastatin  40 mg Oral q1800  .  clarithromycin  500 mg Oral BID  . digoxin  0.0625 mg Oral Daily  . furosemide  40 mg Oral Daily  . insulin aspart  0-20 Units Subcutaneous TID WC  . insulin aspart  0-5 Units Subcutaneous QHS  . insulin aspart  3 Units Subcutaneous TID WC  . insulin glargine  10 Units Subcutaneous BID  . metoprolol succinate  12.5 mg Oral BID  . pantoprazole  40 mg Oral BID  . rivaroxaban  20 mg Oral Daily  . sodium chloride  250 mL Intravenous Once  . sodium chloride  10-40 mL Intracatheter Q12H  . sodium chloride  3 mL Intravenous Q12H  . spironolactone  12.5 mg Oral Daily   Continuous Infusions: . sodium chloride 10 mL/hr at 12/01/14 0700  . norepinephrine (LEVOPHED) Adult infusion Stopped (12/01/14 1952)   PRN Meds:.albuterol, ALPRAZolam, ondansetron **OR** ondansetron (ZOFRAN) IV, oxyCODONE, sodium chloride Assessment/Plan: Active Problems:   Essential hypertension   CAD- s/p CABG July 2014 [facility]   DM (diabetes mellitus), type 2 with peripheral vascular complications   Anemia of chronic disease   Atrial fibrillation with rapid ventricular response   Homelessness   Acute on chronic heart failure   Atrial fibrillation, unspecified   Acute  on chronic combined systolic and diastolic CHF (congestive heart failure)   Acute on chronic systolic congestive heart failure   Acute respiratory distress   Cardiogenic shock   GI bleed   Acute blood loss anemia   Hemorrhagic shock   Duodenal ulcer with hemorrhage  59 yo male with systolic CHF, DM II, HTN, CAD s/p CABG, afib on anticoag, here with CHF exacerbation.  Cardiogenic shock from Acute on chronic systolic CHF - ischemic cardiomyopathy -heart failure team following and appreciate their recommendations. -CO-OX today at 57, CVP 6.  -off Levophed.   -Digoxin 0.0625 mg daily, Spironolactone 12.5 daily and Lasix 40mg  po daily - follow up CHF team recommendations.   GI bleed - EGD with finding of large, bleeding duodenal ulcer.  Treated  with epinephrine and clipping.   -back on xarelto now since 9/8, no bleeding on it -s/p protonix GTT --> Protonix 40mg  PO bid 9/9. -Hgb stable 8.9 yesterday. -Serum H. Pylori positive.  Antibiotics per GI: Biaxin and amoxicillin x 10 days starting on 9/7 --> 12/11/14   Atrial fibrillation with RVR - CHADSVASC2 of 4. -Stop amiodarone --> Toprol 12.5mg  PO BID -continue xarelto  -follow up arterial doppler evaluation of subclavians per Dr. Kathlyn [place] note.  Leukocytosis - resolved after taking out RIJ cath, no evidence of current infection -blood cx x2 NGTD, catheter tip cx NGTD -urine cx NGTD final report  Diabetes mellitus type II -stable -sliding scale insulin + meal time + qhs coverage.  -Lantus bid -hemoglobin A1c 8.1 in August 2016 -Dietician consult  CAD s/p CABG  -holding asa for now b/c of GI bleed  Hyperlipidemia - on lipitor  Constipation -Senokot-S 1 tablet now and qhs.  Homelessness - appreciate CHF team's effort in arranging housing for him.  Dispo: Disposition is deferred at this time, awaiting improvement of current medical problems.    The patient does have a current PCP ([name], MD) and does not need an [facility] hospital follow-up appointment after discharge.  The patient does have transportation limitations that hinder transportation to clinic appointments.    LOS: 11 days   [name], DO 12/04/2014, 9:21 AM

## 2014-12-05 ENCOUNTER — Inpatient Hospital Stay (HOSPITAL_COMMUNITY): Payer: Medicaid Other

## 2014-12-05 DIAGNOSIS — R6889 Other general symptoms and signs: Secondary | ICD-10-CM | POA: Insufficient documentation

## 2014-12-05 DIAGNOSIS — M79669 Pain in unspecified lower leg: Secondary | ICD-10-CM

## 2014-12-05 LAB — BASIC METABOLIC PANEL
Anion gap: 5 (ref 5–15)
BUN: 36 mg/dL — AB (ref 6–20)
CHLORIDE: 96 mmol/L — AB (ref 101–111)
CO2: 29 mmol/L (ref 22–32)
CREATININE: 1.16 mg/dL (ref 0.61–1.24)
Calcium: 8.3 mg/dL — ABNORMAL LOW (ref 8.9–10.3)
GFR calc Af Amer: >60 mL/min (ref >60)
GFR calc non Af Amer: >60 mL/min (ref >60)
GLUCOSE: 182 mg/dL — AB (ref 65–99)
Potassium: 4.8 mmol/L (ref 3.5–5.1)
SODIUM: 130 mmol/L — AB (ref 135–145)

## 2014-12-05 LAB — CBC
HCT: 27.1 % — ABNORMAL LOW (ref 39.0–52.0)
Hemoglobin: 8.2 g/dL — ABNORMAL LOW (ref 13.0–17.0)
MCH: 23.2 pg — AB (ref 26.0–34.0)
MCHC: 30.3 g/dL (ref 30.0–36.0)
MCV: 76.6 fL — AB (ref 78.0–100.0)
PLATELETS: 255 10*3/uL (ref 150–400)
RBC: 3.54 MIL/uL — ABNORMAL LOW (ref 4.22–5.81)
RDW: 22.5 % — AB (ref 11.5–15.5)
WBC: 7.1 10*3/uL (ref 4.0–10.5)

## 2014-12-05 LAB — GLUCOSE, CAPILLARY
Glucose-Capillary: 140 mg/dL — ABNORMAL HIGH (ref 65–99)
Glucose-Capillary: 142 mg/dL — ABNORMAL HIGH (ref 65–99)

## 2014-12-05 MED ORDER — AMOXICILLIN 500 MG PO CAPS: 1000.0000 mg | ORAL_CAPSULE | Freq: Two times a day (BID) | ORAL | Status: DC

## 2014-12-05 MED ORDER — FUROSEMIDE 40 MG PO TABS: 40.0000 mg | ORAL_TABLET | Freq: Every day | ORAL | Status: DC

## 2014-12-05 MED ORDER — CLARITHROMYCIN 500 MG PO TABS: 500.0000 mg | ORAL_TABLET | Freq: Two times a day (BID) | ORAL | Status: DC

## 2014-12-05 MED ORDER — ATORVASTATIN CALCIUM 40 MG PO TABS: 40.0000 mg | ORAL_TABLET | Freq: Every day | ORAL | Status: DC

## 2014-12-05 MED ORDER — SPIRONOLACTONE 25 MG PO TABS: 12.5000 mg | ORAL_TABLET | Freq: Every day | ORAL | Status: DC

## 2014-12-05 MED ORDER — METOPROLOL SUCCINATE ER 25 MG PO TB24: 25.0000 mg | ORAL_TABLET | Freq: Two times a day (BID) | ORAL | Status: DC

## 2014-12-05 MED ORDER — SENNOSIDES-DOCUSATE SODIUM 8.6-50 MG PO TABS: 1.0000 | ORAL_TABLET | Freq: Every day | ORAL | Status: DC

## 2014-12-05 MED ORDER — DIGOXIN 62.5 MCG PO TABS: 0.0625 mg | ORAL_TABLET | Freq: Every day | ORAL | Status: DC

## 2014-12-05 NOTE — Progress Notes (Signed)
Subjective: Doing well today, had bowel movement x 2 since yesterday.  No blood.  Denies chest pain or shortness of breath.  No leg swelling.  Patient ready to leave today because of religious holiday.  States he will pick up his medications and make his follow up appointments that have been scheduled for him.  Objective: Vital signs in last 24 hours: Filed Vitals:   12/05/14 0619 12/05/14 0811 12/05/14 0927 12/05/14 1123  BP: 122/58 124/60 103/60 108/52  Pulse: 98 85 100 90  Temp: 99.9 F (37.7 C) 97.2 F (36.2 C)  98.3 F (36.8 C)  TempSrc: Oral Oral  Oral  Resp: 21     Height:      Weight: 203 lb 14.4 oz (92.488 kg)     SpO2: 100% 100% 100% 100%   Weight change:   Intake/Output Summary (Last 24 hours) at 12/05/14 1609 Last data filed at 12/05/14 0928  Gross per 24 hour  Intake   1060 ml  Output    475 ml  Net    585 ml   General: sitting up in chair, no distress HEENT: EOMI, no pallor Cardiac: irregular rate, irregular rhythm, not tachycardic Pulm: normal work of breathing, clear to auscultation Abd: soft, nontender, nondistended, BS present Ext: PICC line in R arm, warm and well perfused, no edema Neuro: alert and oriented X3  Lab Results: Basic Metabolic Panel:  Recent Labs Lab 12/04/14 0314 12/05/14 0505  NA 133* 130*  K 3.7 4.8  CL 93* 96*  CO2 30 29  GLUCOSE 95 182*  BUN 30* 36*  CREATININE 1.28* 1.16  CALCIUM 8.6* 8.3*   CBC:  Recent Labs Lab 12/03/14 0415 12/05/14 0505  WBC 7.7 7.1  HGB 8.9* 8.2*  HCT 29.3* 27.1*  MCV 76.5* 76.6*  PLT 216 255   CBG:  Recent Labs Lab 12/04/14 0840 12/04/14 1252 12/04/14 1651 12/04/14 2115 12/05/14 0547 12/05/14 1117  GLUCAP 109* 212* 95 178* 140* 142*    Medications: Scheduled Meds: . amoxicillin  1,000 mg Oral BID  . atorvastatin  40 mg Oral q1800  . clarithromycin  500 mg Oral BID  . digoxin  0.0625 mg Oral Daily  . furosemide  40 mg Oral Daily  . insulin aspart  0-20 Units  Subcutaneous TID WC  . insulin aspart  0-5 Units Subcutaneous QHS  . insulin aspart  3 Units Subcutaneous TID WC  . insulin glargine  10 Units Subcutaneous BID  . metoprolol succinate  12.5 mg Oral BID  . pantoprazole  40 mg Oral BID  . rivaroxaban  20 mg Oral Daily  . senna-docusate  1 tablet Oral QHS  . sodium chloride  250 mL Intravenous Once  . sodium chloride  10-40 mL Intracatheter Q12H  . sodium chloride  3 mL Intravenous Q12H  . spironolactone  12.5 mg Oral Daily   Continuous Infusions: . sodium chloride 10 mL/hr at 12/01/14 0700   PRN Meds:.albuterol, ALPRAZolam, ondansetron **OR** ondansetron (ZOFRAN) IV, oxyCODONE, sodium chloride Assessment/Plan: Active Problems:   Essential hypertension   CAD- s/p CABG July 2014 [facility]   DM (diabetes mellitus), type 2 with peripheral vascular complications   Anemia of chronic disease   Atrial fibrillation with rapid ventricular response   Homelessness   Acute on chronic heart failure   Atrial fibrillation, unspecified   Acute on chronic combined systolic and diastolic CHF (congestive heart failure)   Acute on chronic systolic congestive heart failure   Acute respiratory distress  Cardiogenic shock   GI bleed   Acute blood loss anemia   Hemorrhagic shock   Duodenal ulcer with hemorrhage   Blood pressure alteration   Calf pain  59 yo male with systolic CHF, DM II, HTN, CAD s/p CABG, afib on anticoag, here with CHF exacerbation.  Cardiogenic shock from Acute on chronic systolic CHF - ischemic cardiomyopathy -heart failure team following and appreciate their recommendations. -discharge home today with following meds based on HF recs: digoxin 0.0625 daily, spironolactone 12.5 daily, lasix 40mg  daily, Toprol XL 25 mg daily, xarelto 20mg  daily, atorvastatin 40mg  daily.  HOLD lisinopril at this point.  GI bleed - EGD with finding of large, bleeding duodenal ulcer.  Treated with epinephrine and clipping.   -back on xarelto now  since 9/8, no bleeding on it.  Will continue at d/c -s/p protonix GTT --> Protonix 40mg  PO bid 9/9. -Hgb stable  -Serum H. Pylori positive.  Antibiotics per GI: Biaxin and amoxicillin x 10 days starting on 9/7 --> 12/11/14   Atrial fibrillation with RVR - CHADSVASC2 of 4. -Stop amiodarone --> Toprol 25mg  daily at d/c -continue xarelto as above  Leukocytosis - resolved after taking out RIJ cath, no evidence of current infection -blood cx x2 NGTD x 4 days, catheter tip cx NGTD -urine cx NGTD final report -d/c picc line  Diabetes mellitus type II -stable -d/c on home meds -hemoglobin A1c 8.1 in August 2016 -Dietician consult  CAD s/p CABG  -holding asa for now b/c of GI bleed.  Will hold at d/c  Hyperlipidemia - on lipitor  Constipation - resolved -Senokot-S 1 tablet now and qhs. -continue at d/c  Homelessness - appreciate CHF team's effort in arranging housing for him.  Dispo: Discharge today.    The patient does have a current PCP ([name], MD) and does not need an [facility] hospital follow-up appointment after discharge.  The patient does have transportation limitations that hinder transportation to clinic appointments.    LOS: 12 days   [name], DO 12/05/2014, 4:09 PM

## 2014-12-05 NOTE — Hospital Discharge Follow-Up (Signed)
Met with patient prior to his discharge to remind him of his appointment tomorrow, 12/06/14 @ 1530 w/ Dr Jarold Song in the [facility].   He said that he will be at the appointment tomorrow and will be going to the [facility] pharmacy when he leaves the hospital today  to pick up his medications.  He said that he is not sure where he will be going when he leaves the hospital stating again  that he has his car and will not go to a shelter or stay with friends and he has no money for a motel.   He noted that he received a call this morning from Ms. Edsel Petrin w/ [employer] re.his apartment. He said that she told him to call her after he leaves the hospital and she will take him to the social security office tomorrow or Wednesday  to re-apply for his disability. He noted that she will also take him to see the apartment that she has for him and he needs to sign the lease. There is then a waiting period before he can move in and that could be up to a week.

## 2014-12-05 NOTE — Progress Notes (Signed)
Paged Internal Medicine MD regarding pt claims "its my Holy day, I need to leave now. Page doctor and tell them I must go now!"

## 2014-12-05 NOTE — Discharge Summary (Signed)
Name: Jacob [name] MRN: [id number] DOB: 04/05/1955 59 y.o. PCP: [name], MD  Date of Admission: 11/23/2014  3:17 AM Date of Discharge: 12/05/2014 Attending Physician: No att. providers found  Discharge Diagnosis: 1. Acute on chronic systolic heart failure 2. GI bleed secondary to duodenal ulcer 3. Atrial fibrillation with RVR 4. Homelessness  Active Problems:   Essential hypertension   CAD- s/p CABG July 2014 [facility]   DM (diabetes mellitus), type 2 with peripheral vascular complications   Anemia of chronic disease   Atrial fibrillation with rapid ventricular response   Homelessness   Acute on chronic heart failure   Atrial fibrillation, unspecified   Acute on chronic combined systolic and diastolic CHF (congestive heart failure)   Acute on chronic systolic congestive heart failure   Acute respiratory distress   Cardiogenic shock   GI bleed   Acute blood loss anemia   Hemorrhagic shock   Duodenal ulcer with hemorrhage   Blood pressure alteration   Calf pain  Discharge Medications:   Medication List    STOP taking these medications        aspirin 81 MG EC tablet     lisinopril 2.5 MG tablet  Commonly known as:  PRINIVIL,ZESTRIL     metoprolol 100 MG tablet  Commonly known as:  LOPRESSOR      TAKE these medications        albuterol (2.5 MG/3ML) 0.083% nebulizer solution  Commonly known as:  PROVENTIL  Take 3 mLs (2.5 mg total) by nebulization every 6 (six) hours as needed for wheezing or shortness of breath.     albuterol 108 (90 BASE) MCG/ACT inhaler  Commonly known as:  PROVENTIL HFA;VENTOLIN HFA  Inhale 2 puffs into the lungs every 6 (six) hours as needed for wheezing or shortness of breath.     amoxicillin 500 MG capsule  Commonly known as:  AMOXIL  Take 2 capsules (1,000 mg total) by mouth 2 (two) times daily.     atorvastatin 40 MG tablet  Commonly known as:  LIPITOR  Take 1 tablet (40 mg total) by mouth daily at 6 PM.     clarithromycin 500 MG tablet  Commonly known as:  BIAXIN  Take 1 tablet (500 mg total) by mouth 2 (two) times daily.     Digoxin 62.5 MCG Tabs  Take 0.0625 mg by mouth daily.     docusate sodium 100 MG capsule  Commonly known as:  COLACE  Take 1 capsule (100 mg total) by mouth daily.     ferrous sulfate 325 (65 FE) MG tablet  Take 1 tablet (325 mg total) by mouth 3 (three) times daily with meals.     furosemide 40 MG tablet  Commonly known as:  LASIX  Take 1 tablet (40 mg total) by mouth daily.     glipiZIDE 10 MG tablet  Commonly known as:  GLUCOTROL  Take 1 tablet (10 mg total) by mouth 2 (two) times daily before a meal.     metoprolol succinate 25 MG 24 hr tablet  Commonly known as:  TOPROL-XL  Take 1 tablet (25 mg total) by mouth 2 (two) times daily.     pantoprazole 40 MG tablet  Commonly known as:  PROTONIX  Take 1 tablet (40 mg total) by mouth 2 (two) times daily.     rivaroxaban 20 MG Tabs tablet  Commonly known as:  XARELTO  Take 1 tablet (20 mg total) by mouth daily before supper.  senna-docusate 8.6-50 MG per tablet  Commonly known as:  Senokot-S  Take 1 tablet by mouth at bedtime.     spironolactone 25 MG tablet  Commonly known as:  ALDACTONE  Take 0.5 tablets (12.5 mg total) by mouth daily.     traMADol 50 MG tablet  Commonly known as:  ULTRAM  Take 1 tablet (50 mg total) by mouth every 8 (eight) hours as needed for moderate pain.        Disposition and follow-up:   [name] was discharged from [facility] in Stable condition.  At the hospital follow up visit please address:  1.  Patients ability to obtain his medications and new prescriptions ordered at discharge.  Please address his housing issues.  He is supposed to be set up with housing later this week but was insistent on leaving hospital Monday 9/12 because of religious holiday.  Please make sure he is staying somewhere with a roof over his head.  Please make sure he is  taking his antibiotics for H. Pylori and PPI prophylaxis for duodenal ulcer.  Please make sure he is taking medications as directed by Heart Failure team as discussed below.  2.  Labs / imaging needed at time of follow-up: CBC, BMP  3.  Pending labs/ test needing follow-up: none  Follow-up Appointments: Follow-up Information    Follow up with [facility]     On 12/06/2014.   Why:  [facility] appointment on 12/06/14 at 3:30pm  with Dr. Venetia Night.   Contact information:   [address] [place] [address] [phone]      Follow up with  [facility] On 12/15/2014.   Specialty:  Cardiology   Why:  at 1140 for post hospital follow up.  Please bring all of your medications with you to your visit.   Contact information:   [address] [id number] mc [place] [address] [phone]      Discharge Instructions: Discharge Instructions    Diet - low sodium heart healthy    Complete by:  As directed      Increase activity slowly    Complete by:  As directed            Consultations: Treatment Team:  Rounding L[facility]cardiology, MD  Procedures Performed:  X-ray Chest Pa And Lateral  11/12/2014   CLINICAL DATA:  Short of breath  EXAM: CHEST  2 VIEW  COMPARISON:  11/11/2014  FINDINGS: Stable left subclavian AICD device. Stable pulmonary edema. Bilateral pleural effusions right greater than left. No pneumothorax.  IMPRESSION: Stable CHF and bilateral pleural effusions right greater than left.   Electronically Signed   By: Jolaine Click M.D.   On: 11/12/2014 11:51   Dg Chest Port 1 View  12/01/2014   CLINICAL DATA:  Central catheter placement  EXAM: PORTABLE CHEST - 1 VIEW  COMPARISON:  November 23, 2014  FINDINGS: New central catheter tip is in the superior vena cava near the cavoatrial junction. Previously placed right jugular catheter tip is also in the superior vena cava  much more proximally. No pneumothorax. No edema or consolidation. Heart is upper normal in size with pulmonary vascularity within normal limits. Pacemaker lead is attached to the right ventricle. No adenopathy.  IMPRESSION: New central catheter tip in superior vena cava. Right jugular catheter tip is in the proximal superior vena cava region. No pneumothorax. No edema or consolidation.   Electronically  Signed   By: Bretta Bang III M.D.   On: 12/01/2014 19:39   Dg Chest Port 1 View  11/23/2014   CLINICAL DATA:  Central line placement.  EXAM: PORTABLE CHEST - 1 VIEW  COMPARISON:  11/23/2014  FINDINGS: There has been interval placement of right internal jugular approach central venous catheter, tip overlies the expected location of inferior superior vena cava. There is no evidence of pneumothorax.  Cardiomediastinal silhouette is stably enlarged. Mediastinal contours appear intact. Median sternotomy wires are stable. Single lead cardiac pacemaker is unchanged.  There is no evidence of focal airspace consolidation, or pneumothorax. There is diffuse increase in the interstitial markings. There is a right pleural effusion.  Osseous structures are without acute abnormality. Soft tissues are grossly normal.  IMPRESSION: Status post right internal jugular approach central venous catheter placement. No evidence of pneumothorax.  Right pleural effusion and possible pulmonary vascular congestion.   Electronically Signed   By: Ted Mcalpine M.D.   On: 11/23/2014 18:36   Dg Chest Port 1 View  11/23/2014   CLINICAL DATA:  Acute respiratory distress.  EXAM: PORTABLE CHEST - 1 VIEW  COMPARISON:  11/23/2014 at 0336 hr  FINDINGS: The heart is enlarged but stable. Stable surgical changes from bypass surgery. Persistent pulmonary edema, pleural effusions and atelectasis. There is however slight improved aeration which may reflect slightly less edema.  IMPRESSION: Persistent edema and right pleural effusion but slight  improved aeration   Electronically Signed   By: Rudie Meyer M.D.   On: 11/23/2014 14:53   Dg Chest Portable 1 View  11/23/2014   CLINICAL DATA:  Palpitations and dyspnea  EXAM: PORTABLE CHEST - 1 VIEW  COMPARISON:  11/12/2014  FINDINGS: There is unchanged cardiomegaly. Ground-glass opacities persist in the central and basilar regions bilaterally. Right pleural effusion persists. Vascular and interstitial congestive changes are present, slightly less severe than on 11/12/2014.  IMPRESSION: Congestive heart failure with slightly less severity compared to 11/12/2014.   Electronically Signed   By: Ellery Plunk M.D.   On: 11/23/2014 04:10   Dg Chest Portable 1 View  11/11/2014   CLINICAL DATA:  Shortness of breath. Congestive heart failure. Coronary artery disease.  EXAM: PORTABLE CHEST - 1 VIEW  COMPARISON:  10/29/2014  FINDINGS: Mild to moderate cardiomegaly stable. Diffuse interstitial infiltrates show no significant change, consistent with interstitial edema. Small right pleural effusion is again demonstrated. Left costophrenic sulcus is not included within the field of view on this exam.  Prior CABG noted. Single lead transvenous pacemaker remains in place.  IMPRESSION: Mild congestive heart failure and small right pleural effusion, without significant change.   Electronically Signed   By: Myles Rosenthal M.D.   On: 11/11/2014 19:19    2D Echo:  - Left ventricle: The cavity size was normal. There was mild focal basal hypertrophy of the septum. Systolic function was moderately to severely reduced. The estimated ejection fraction was in the range of 30% to 35%. Diffuse hypokinesis. - Mitral valve: Calcified annulus. There was mild regurgitation. - Left atrium: The atrium was moderately dilated. - Right ventricle: The cavity size was mildly dilated. Wall thickness was normal.  Admission HPI: Mr. Keaten Mashek is a 59 y.o. male with past medical history of hypertension, chronic HFrEF with AICD  (EF 20-25%), coronary artery disease, diabetes mellitus type II, chronic atrial fibrillation on Xarelto, hyperlipidemia, and history of non-compliance who presents to the emergency department with sharp, left sided chest pain. States his chest pain began  around 5pm last night and described as sharp, non-radiating, and reproducible on palpation. Associated with dizziness, shortness of breath. Denies having chest pain on exertion. Reports symptoms of orthopnea, PND. States that since his last discharge he has been compliant with his medications stating he takes 120mg  Lasix in the morning and 80mg  in the evening. However, states he has been taking 75mg  Metoprolol bid when should be taking 100mg  bid.   In the ED, patient was noted to be in atrial fibrillation with RVR, rate of 139 on EKG. During our exam his rate was improved to the low 100s-110s. He received diltiazem 15mg  IV and Lasix 80mg  IV in the emergency department. Initial troponin was elevated at 0.06 and BNP elevated 866 (Last admission was elevated to 807). Portable 1-view chest xray was read as CHF with less severity compared to 11/12/14 imaging.  Hospital Course by problem list: Active Problems:   Essential hypertension   CAD- s/p CABG July 2014 [facility]   DM (diabetes mellitus), type 2 with peripheral vascular complications   Anemia of chronic disease   Atrial fibrillation with rapid ventricular response   Homelessness   Acute on chronic heart failure   Atrial fibrillation, unspecified   Acute on chronic combined systolic and diastolic CHF (congestive heart failure)   Acute on chronic systolic congestive heart failure   Acute respiratory distress   Cardiogenic shock   GI bleed   Acute blood loss anemia   Hemorrhagic shock   Duodenal ulcer with hemorrhage   Blood pressure alteration   Calf pain   1. Cardiogenic shock from Acute on chronic systolic CHF - ischemic cardiomyopathy: heart failure team followed and managed  Mr. Barfield during the majority of his hospital stay.  He was discharged on the following HF meds per their recommendations: digoxin 0.0625mg  daily, spironolactone 12.5mg  daily, Lasix 40mg  daily, Toprol XL 25mg  daily, Xarelto 20mg  daily, atorvostatin 40mg  daily.  Lisinopril was held at this point at discharge. On 8/31, patient was transferred to the ICU due to cardiogenic shock and was in atrial fibrillation with RVR.  He was started on amiodarone, a right IJ central line was placed.  His initial CO-OX was 44% and CVP of 27.  He was additionally started on Levophed due to hypotension and diuresed with Lasix.  On 9/1, milrinone was added, amiodarone increased to 60mg /hr, and Levophed was weaned off.  9/2, he was continued on milrinone, amiodarone, and Levophed. 9/4, he developed a GI bleed and received 3 units of pRBCs and Xarelto was stopped.  9/5, he had an EGD with a bleeding duodenal ulcer.  He was continued on milrinone, Levophed, and amiodarone.  9/6, patients hemoglobin stable, CO-OX 70%, milirinone was stopped with maintenance of CO-OX at 65%.  He was transitioned to oral amiodarone and torsemide. 9/7, Patient became hypotensive, given 250cc NS bolus, started back on Levophed and received no diuretics.  On 9/8, patient was off Levophed, received another 250cc NS bolus, had RIJ removed and had a PICC line placed. 9/9, amiodarone stopped and patient started on Toprol XL.  On 9/11, he was transferred to telemetry. 9/12, discharged on the medications as listed above.  2.GI bleed: Patient developed acute blood loss anemia with drop in hemoglobin 9.6 on 9/1 to 6.5 on 9/4.  He had FOBT positive. EGD with finding of large, bleeding duodenal ulcer.Treated with epinephrine and clipping. Xarelto was initially held and then restarted on 9/8 with no signs of bleeding once restarted.  He was initially placed on  a Protonix drip, transitioned to IV Protonix then to Protonix 40mg  PO bid.  Per GI, he is to take this PPI  twice daily for 2-3 months, then once daily permanently.  His serum H. Pylori was positive and so he was started on Biaxin and amoxicillin for 10 days.  He is to take these antibiotics through September 18.  3 .Atrial fibrillation with RVR - CHADSVASC2 of 4: Patient remained in atrial fibrillation throughout admission.  Rate was initially controlled on amiodarone and then transitioned to Toprol XL as discussed above.  Xarelto was held in setting of GI bleed and restarted on September 8 as discussed above.  4. Leukocytosis: this resolved after taking out RIJ cath and there was no evidence of infection.  We drew blood cx x2 that have shown no growth and a catheter tip cx that has shown no growth.  His final urine cx report had no growth.   We discontinued his PICC line at discharge.  5. Diabetes mellitus type II: His Hbg A1c in August 2016 is 8.1.  Initially, his blood sugars were difficult to control, but we maintained good glycemic control on sliding scale insulin, meal time coverage and at bedtime coverage.  We also provided patient with a dietician consult.  6. Hyperlipidemia: Patient on lipitor and this was continued at discharge.  Discharge Vitals:   BP 108/52 mmHg  Pulse 90  Temp(Src) 98.3 F (36.8 C) (Oral)  Resp 21  Ht 5\' 4"  (1.626 m)  Wt 203 lb 14.4 oz (92.488 kg)  BMI 34.98 kg/m2  SpO2 100%  Discharge Labs:  Results for orders placed or performed during the hospital encounter of 11/23/14 (from the past 24 hour(s))  Glucose, capillary     Status: Abnormal   Collection Time: 12/04/14  9:15 PM  Result Value Ref Range   Glucose-Capillary 178 (H) 65 - 99 mg/dL  Basic metabolic panel     Status: Abnormal   Collection Time: 12/05/14  5:05 AM  Result Value Ref Range   Sodium 130 (L) 135 - 145 mmol/L   Potassium 4.8 3.5 - 5.1 mmol/L   Chloride 96 (L) 101 - 111 mmol/L   CO2 29 22 - 32 mmol/L   Glucose, Bld 182 (H) 65 - 99 mg/dL   BUN 36 (H) 6 - 20 mg/dL   Creatinine, Ser 1.88 0.61  - 1.24 mg/dL   Calcium 8.3 (L) 8.9 - 10.3 mg/dL   GFR calc non Af Amer >60 >60 mL/min   GFR calc Af Amer >60 >60 mL/min   Anion gap 5 5 - 15  CBC     Status: Abnormal   Collection Time: 12/05/14  5:05 AM  Result Value Ref Range   WBC 7.1 4.0 - 10.5 K/uL   RBC 3.54 (L) 4.22 - 5.81 MIL/uL   Hemoglobin 8.2 (L) 13.0 - 17.0 g/dL   HCT 67.7 (L) 37.3 - 66.8 %   MCV 76.6 (L) 78.0 - 100.0 fL   MCH 23.2 (L) 26.0 - 34.0 pg   MCHC 30.3 30.0 - 36.0 g/dL   RDW 15.9 (H) 47.0 - 76.1 %   Platelets 255 150 - 400 K/uL  Glucose, capillary     Status: Abnormal   Collection Time: 12/05/14  5:47 AM  Result Value Ref Range   Glucose-Capillary 140 (H) 65 - 99 mg/dL  Glucose, capillary     Status: Abnormal   Collection Time: 12/05/14 11:17 AM  Result Value Ref Range   Glucose-Capillary 142 (H)  65 - 99 mg/dL   Comment 1 Document in Chart     Signed: [name], DO 12/05/2014, 8:38 PM    Services Ordered on Discharge: none Equipment Ordered on Discharge: none

## 2014-12-05 NOTE — Progress Notes (Signed)
VASCULAR LAB PRELIMINARY  PRELIMINARY  PRELIMINARY  PRELIMINARY  Bilateral lower extremity venous duplex completed.    Preliminary report:  There is no DVT or SVT noted in the bilateral lower extremities.  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  Charnell Peplinski, RVT 12/05/2014, 11:08 AM

## 2014-12-05 NOTE — Progress Notes (Addendum)
Paged Internal Medicine MD regarding pt stating "I am leaving now". Pt was advised to remain in room so PICC line could be removed. Pt stated "No I will go with it and have it removed somewhere else. I am leaving!". Pt appears angry and refuses to sign AMA papers. Will continue to monitor.  Discharge orders placed. Will continue to monitor pt until PICC line is removed. 1345 PICC line has been out for 30 min, no complications.Pt has orders to be discharged. Discharge instructions given and pt has no additional questions at this time. Medication regimen reviewed and pt educated. Pt verbalized understanding and has no additional questions. Telemetry box removed. Jilda Panda RN

## 2014-12-05 NOTE — Progress Notes (Signed)
Advanced Heart Failure Rounding Note   Subjective:    Transferred to ICU on 8/31 --> cardiogenic shock. He was also back in A fib so he was loaded on amio. Central line placed. Initial CO-OX 44% and CVP 27. Started on norepi due to hypotension and diuresed with IV lasix.   9/1  Milrinone 0.25 mcg, Amio increased to 60 mg per hour,  norepi was weaned off. Brisk diuresis noted.  9/2 Milrinone 0.25 mcg+ Amio 60 mg per hour+ Norepi 5 mcg.  9/4 Developed GIB. Received 3 units RBCs. Xarelto stopped  9/5 EGD- Duodenal Bleed- Clip applied.  Milrinone 0.125 mcg + levo 6 mcg+ amio  9/6- Hgb 8.4 CO-OX 70% Milrinone stopped with adequate CO-OX (65% off milrinone) . Transitioned to po amiodarone.  Transitioned to torsemide.   9/7 Hypotensive. 250 NS bolus. Started back norepi. No diuretic.  9/8 ff levo. received 250 NS. WBC 18.7>9.2 UA Ok. Blood cultures pending. RUE PICC 9/9 Amiodarone stopped. Started on Toprol XL 9/11 Transferred to telemetry. VAS [place] 1-39% RICA stenosis, 40-59% LICA stenosis.  No comment on subclavians.  Insistent on leaving today for a Muslim holiday of Eid al-Adha. (9/11-9/12). Says he does not know what he will do until his apartment ready, but says he will "make something work."  Asks about getting his PICC out.  Had a BM yesterday and this am.  No bleeding.  Denies SOB or palpitations.   Weight stable on po lasix over weekend. Cr trending down. 1.34 -> 1.28 -> 1.16. WBC 7.1. Blood cultures NGTD.  Was encouraged to stay as his housing will not be ready until 9/15.  Has Transition Care Clinic appointment at [facility] on 9/13 at 1530.  Objective:   Weight Range:  Vital Signs:   Temp:  [97.2 F (36.2 C)-99.9 F (37.7 C)] 97.2 F (36.2 C) (09/12 0811) Pulse Rate:  [85-99] 85 (09/12 0811) Resp:  [18-21] 21 (09/12 0619) BP: (90-143)/(44-79) 124/60 mmHg (09/12 0811) SpO2:  [100 %] 100 % (09/12 0811) Weight:  [203 lb 14.4 oz (92.488 kg)] 203 lb 14.4  oz (92.488 kg) (09/12 0619) Last BM Date: 12/04/14  Weight change: Filed Weights   12/02/14 0500 12/03/14 0336 12/05/14 0619  Weight: 200 lb 13.4 oz (91.1 kg) 203 lb 14.8 oz (92.5 kg) 203 lb 14.4 oz (92.488 kg)    Intake/Output:   Intake/Output Summary (Last 24 hours) at 12/05/14 0830 Last data filed at 12/05/14 6387  Gross per 24 hour  Intake    720 ml  Output    825 ml  Net   -105 ml     Physical Exam: General:  Seated in chair, walking around room without difficulty on arrival.   NAD HEENT: normal.  Neck: supple.  JVP 7-8  Carotids 2+ bilat; no bruits. No lymphadenopathy or thryomegaly noted. Cor: PMI nondisplaced. Irregular rate & rhythm. No rubs, or murmurs.+ S3  Lungs: clear Abdomen: soft, nontender, nondistended. No hepatosplenomegaly. No bruits or masses. +BS Extremities: no cyanosis, clubbing, rash, R and LLE . RUE PICC  Neuro: alert & orientedx3, cranial nerves grossly intact. moves all 4 extremities w/o difficulty. Affect pleasant GU:  foley   Telemetry: A fib 90s   Labs: Basic Metabolic Panel:  Recent Labs Lab 12/01/14 0230 12/02/14 0516 12/03/14 0415 12/04/14 0314 12/05/14 0505  NA 126* 128* 128* 133* 130*  K 4.6 4.6 4.5 3.7 4.8  CL 93* 95* 94* 93* 96*  CO2 23 27 27 30  29  GLUCOSE 181* 90 98 95 182*  BUN 47* 36* 32* 30* 36*  CREATININE 1.55* 1.33* 1.34* 1.28* 1.16  CALCIUM 8.2* 8.2* 8.4* 8.6* 8.3*    Liver Function Tests: No results for input(s): AST, ALT, ALKPHOS, BILITOT, PROT, ALBUMIN in the last 168 hours. No results for input(s): LIPASE, AMYLASE in the last 168 hours. No results for input(s): AMMONIA in the last 168 hours.  CBC:  Recent Labs Lab 12/01/14 0230 12/01/14 1230 12/02/14 0516 12/03/14 0415 12/05/14 0505  WBC 18.7* 13.9* 9.2 7.7 7.1  HGB 9.7* 9.7* 9.2* 8.9* 8.2*  HCT 31.1* 31.1* 29.4* 29.3* 27.1*  MCV 76.4* 76.0* 77.0* 76.5* 76.6*  PLT 293 290 201 216 255    Cardiac Enzymes: No results for input(s): CKTOTAL, CKMB,  CKMBINDEX, TROPONINI in the last 168 hours.  BNP: BNP (last 3 results)  Recent Labs  10/29/14 1540 11/11/14 1833 11/23/14 0328  BNP 1232.4* 807.3* 866.8*    ProBNP (last 3 results)  Recent Labs  09/06/14 1107 09/20/14 1201 10/04/14 1256  PROBNP 1577.00* 3006.00* 2524.00*      Other results:  Imaging: No results found.   Medications:     Scheduled Medications: . amoxicillin  1,000 mg Oral BID  . atorvastatin  40 mg Oral q1800  . clarithromycin  500 mg Oral BID  . digoxin  0.0625 mg Oral Daily  . furosemide  40 mg Oral Daily  . insulin aspart  0-20 Units Subcutaneous TID WC  . insulin aspart  0-5 Units Subcutaneous QHS  . insulin aspart  3 Units Subcutaneous TID WC  . insulin glargine  10 Units Subcutaneous BID  . metoprolol succinate  12.5 mg Oral BID  . pantoprazole  40 mg Oral BID  . rivaroxaban  20 mg Oral Daily  . senna-docusate  1 tablet Oral QHS  . sodium chloride  250 mL Intravenous Once  . sodium chloride  10-40 mL Intracatheter Q12H  . sodium chloride  3 mL Intravenous Q12H  . spironolactone  12.5 mg Oral Daily    Infusions: . sodium chloride 10 mL/hr at 12/01/14 0700    PRN Medications: albuterol, ALPRAZolam, ondansetron **OR** ondansetron (ZOFRAN) IV, oxyCODONE, sodium chloride   Assessment:   1. Acute on chronic systolic CHF, ICM EF 20-25% with poor RV function, NYHA class IV -> Cardiogenic shock.  [employer] ICD.  2. Acute hypoxic respiratory failure 3. Chronic Afib RVR - no anticoagulation with GI bleed.s 4. 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 DM type II 6. Homelessness 7. Hypotension  8. AKI 9. Hyponatremia 10. Hypomagnesium 11. Acute GI bleed with blood loss anemia 12 H Pylori   Plan/Discussion:    9/5 S/P EGD- Duodenal Bleed with clot note. Clip placed. Hgb trending down 9.7>9.2>8.9>8.2. Watch closely. No evidence of bleeding. On PPI. Will need PPI twice a day 2-3 months then daily per GI. Xarelto restarted 9/8.      Milrinone stopped 9/6 with stable coox. Levo stopped. CO-OX 56.8% 9/11. Continue dig 0.125 mg daily and spiro.  No BB with low output. Volume status stable.  May be able to start lisinopril with renal function improving.   Remains in A fib but rate better controlled. Continue Toprol XL 12.5 BID. On Xarelto   WBC back down 18>9.2. UA ok. Blood cultures pending. Central line removed tip culture pending. On amoxicillin for H Pylori.   He is not a candidate for home inotropes due to social situation.   Was encouraged to stay as his housing  will not be ready until 9/15. He is insistent on leaving today for a Muslim holiday as above. He is not sure where he would sleep tonight if he leaves. Stressed the importance of him having a roof over his head.  Has Transition Care Clinic appointment at [facility] on 9/13 at 1530.  Will need close follow up with all providers, but also has no transportation.  Mariam Dollar Tillery PA-C  8:30 AM Advanced Heart Failure Team Pager [phone] (M-F; 7a - 4p)  Please contact [facility] for night-coverage after hours (4p -7a ) and weekends on [email or web address]  Patient seen and examined with Otilio Saber, PA-C. We discussed all aspects of the encounter. I agree with the assessment and plan as stated above.   Clinically stable for d/c today although social issues remain a challenge. He assures me he has a way to sleep indoors for the next few days and will pick up his medications this afternoon. Has F/u in the HF clinic next week.  Please discharge on following HF meds:  Digoxin 0.0625 mg daily Spiro 12.5 mg daily Lasix 40 mg daily Toprol XL  daily Xarelto 20 mg daily Atorva 40 mg daily.  No lisinopril at this point.   Appreciate care of IMTS.   Olivia Royse,MD 10:01 AM

## 2014-12-06 ENCOUNTER — Telehealth: Payer: Self-pay

## 2014-12-06 ENCOUNTER — Ambulatory Visit: Payer: Medicaid Other | Admitting: Family Medicine

## 2014-12-06 ENCOUNTER — Telehealth: Payer: Self-pay | Admitting: Internal Medicine

## 2014-12-06 LAB — CULTURE, BLOOD (ROUTINE X 2)
CULTURE: NO GROWTH
CULTURE: NO GROWTH

## 2014-12-06 MED ORDER — DOXYCYCLINE HYCLATE 100 MG PO TABS: 100.0000 mg | ORAL_TABLET | Freq: Two times a day (BID) | ORAL | Status: DC

## 2014-12-06 MED ORDER — METRONIDAZOLE 250 MG PO TABS: 250.0000 mg | ORAL_TABLET | Freq: Four times a day (QID) | ORAL | Status: AC

## 2014-12-06 NOTE — Telephone Encounter (Signed)
Transitional Care Clinic Post-discharge Follow-Up Phone Call:  Date of Discharge: 12/05/2014 Principal Discharge Diagnosis(es): acute on chronic systolic heart failure, GI bleed Post-discharge Communication: Called the patient to check on his status and to remind him of his appointment this afternoon. He said that he will be at the appointment.  Call Completed: Yes                    With Whom: Patient Interpreter Needed: No                 Please check all that apply:  X Patient is knowledgeable of his/her condition(s) and/or treatment. X Patient is caring for self.    - He is currently homeless.  ? Patient is receiving assist at home from family and/or caregiver. Family and/or caregiver is knowledgeable of patient's condition(s) and/or treatment. ? Patient is receiving home health services. If so, name of agency.     Medication Reconciliation:  ? Medication list reviewed with patient. X Patient obtained all discharge medications. If not, why? He said that he did not pick up his medications at the pharmacy yet and he will get them today when he comes to his appointment.   Activities of Daily Living:  X Independent ? Needs assist (describe; ? home DME used) ? [facility] (describe, ? home DME used)   Community resources in place for patient:  X Other - He stated that he is meeting with Ms. Bradly Bienenstock, from the [employer] [facility] [employer],  at 1000 and she is taking him to the social security office to apply for disability and then she is taking him to see the apartment that is available for him. ? Home Health/Home DME ? Assisted Living ? Support Group          Patient Education: Instructed him re.the importance of getting all of his medications and complying with the orders to take them.        Questions/Concerns discussed: He reported no concerns at this time and stated that he would be at his appointment at 1530.

## 2014-12-06 NOTE — Telephone Encounter (Signed)
Called by [facility] pharmacy about interaction of clarithromycin with xarelto and atorvastatin. I have advised we will change Clarithromycin to Metronidazole 250mg  QID and Doxycycline 100mg  BID as well as continue amoxicillin for 7 days.

## 2014-12-07 ENCOUNTER — Telehealth: Payer: Self-pay

## 2014-12-07 NOTE — Telephone Encounter (Signed)
This Case Manager called patient to check on status, to determine if he picked up medications at patient did not show up for appointment on 12/06/14 at 1530.  Patient indicated he was "breathing good today." Denied shortness of breath. Inquired if patient taking medications as prescribed. Patient indicated he would be coming to [facility] pharmacy today to pick up all medications. Thoroughly stressed importance of picking up medications as soon as possible and taking all medications as prescribed. Patient verbalized understanding. Also inquired if patient would be able to come in on Friday morning (12/09/14) to see Dr. Venetia Night rather than on 12/12/14 so he would have a physician examination earlier. Patient indicated he was unable to come in on 12/09/14 but would be at his appointment on 12/12/14. No other needs identified.

## 2014-12-09 ENCOUNTER — Encounter: Payer: Self-pay | Admitting: Surgery

## 2014-12-09 ENCOUNTER — Telehealth: Payer: Self-pay

## 2014-12-09 NOTE — Telephone Encounter (Signed)
This Case Manager placed call to patient check on status. Patient denies shortness of breath and indicates he is feeling "fine." Reminded patient of Transitional Care Clinic appointment on 12/12/14 at 1530. Patient indicated he will be at appointment and has transportation to his appointment. Patient also indicated he will be meeting with Ms. Bradly Bienenstock with [facility] [employer] to determine if he will be moving into an apartment today. No additional needs identified at this time.

## 2014-12-12 ENCOUNTER — Ambulatory Visit: Payer: Medicaid Other | Attending: Family Medicine | Admitting: Family Medicine

## 2014-12-12 ENCOUNTER — Encounter: Payer: Self-pay | Admitting: Family Medicine

## 2014-12-12 ENCOUNTER — Telehealth: Payer: Self-pay

## 2014-12-12 ENCOUNTER — Ambulatory Visit (HOSPITAL_COMMUNITY): Payer: Medicaid Other

## 2014-12-12 ENCOUNTER — Encounter: Payer: Medicaid Other | Admitting: Surgery

## 2014-12-12 VITALS — BP 117/77 | HR 101 | Temp 98.1°F | Ht 64.0 in | Wt 218.0 lb

## 2014-12-12 DIAGNOSIS — I739 Peripheral vascular disease, unspecified: Secondary | ICD-10-CM | POA: Diagnosis not present

## 2014-12-12 DIAGNOSIS — R7689 Other specified abnormal immunological findings in serum: Secondary | ICD-10-CM | POA: Insufficient documentation

## 2014-12-12 DIAGNOSIS — R768 Other specified abnormal immunological findings in serum: Secondary | ICD-10-CM | POA: Insufficient documentation

## 2014-12-12 DIAGNOSIS — E1159 Type 2 diabetes mellitus with other circulatory complications: Secondary | ICD-10-CM | POA: Insufficient documentation

## 2014-12-12 DIAGNOSIS — K269 Duodenal ulcer, unspecified as acute or chronic, without hemorrhage or perforation: Secondary | ICD-10-CM | POA: Diagnosis not present

## 2014-12-12 DIAGNOSIS — I509 Heart failure, unspecified: Secondary | ICD-10-CM | POA: Diagnosis not present

## 2014-12-12 DIAGNOSIS — I10 Essential (primary) hypertension: Secondary | ICD-10-CM | POA: Insufficient documentation

## 2014-12-12 DIAGNOSIS — Z720 Tobacco use: Secondary | ICD-10-CM | POA: Diagnosis not present

## 2014-12-12 DIAGNOSIS — I482 Chronic atrial fibrillation, unspecified: Secondary | ICD-10-CM

## 2014-12-12 DIAGNOSIS — K922 Gastrointestinal hemorrhage, unspecified: Secondary | ICD-10-CM

## 2014-12-12 DIAGNOSIS — R760 Raised antibody titer: Secondary | ICD-10-CM | POA: Diagnosis not present

## 2014-12-12 LAB — GLUCOSE, POCT (MANUAL RESULT ENTRY): POC Glucose: 236 mg/dl — AB (ref 70–99)

## 2014-12-12 MED ORDER — TRAMADOL HCL 50 MG PO TABS: 50.0000 mg | ORAL_TABLET | Freq: Three times a day (TID) | ORAL | Status: DC | PRN

## 2014-12-12 NOTE — Telephone Encounter (Signed)
Met with the patient when he was at [facility] for his appt today. He said that he did not get the housing that he was planning on because the current tenants decided not to move out.  He said that he does not have anywhere to go and will have to stay in his car again.  He said that his friend gave him money for a motel and he has been staying at [facility] since he was discharged from the hospital but he can't ask his friend for any more money.  He said that he is meeting with Ms. Edsel Petrin from the [employer]  tomorrow at 1300 to discuss further housing options. He gave this CM approval to call Ms Edsel Petrin to discuss the plan for housing.  He continues to refuse to go to a shelter or to a food pantry.   Call placed to Ms Edsel Petrin # [phone] to inquire about the current housing plan. Voice mail message left requesting a call back to # [phone].

## 2014-12-12 NOTE — Progress Notes (Signed)
TRANSITIONAL CARE CLINIC  Date of Telephone Encounter:  12/07/14  Admit Date: 11/23/14 Discharge Date: 12/05/14  PCP: Dr Hyman Hopes  Subjective:  Patient ID: Jacob [name], male    DOB: 1955-09-07  Age: 59 y.o. MRN: [id number]  CC: Hospitalization Follow-up   HPI Zahkai Mershon presents is a 59 year old male with a history of CHF (EF 30-35% s/p AICD), Atrial fibrillation, Type 2 DM, HTN, Peripheral Vascular Disease who presented to the ED with chest pains, dizziness, shortness of breath.  He was found to be in atrial fibrillation with RVR  in the ED, rate of 139 on EKG. He received diltiazem 15mg  IV and Lasix 80mg  IV in the emergency department. Initial troponin was elevated at 0.06 and BNP elevated 866. He was admitted and seen by Cardiology; hospital course was complicated by Cardiogenic shock necessitating ICU admission and the use of pressors (Levophed) due to hypotension. Rate control was achieved via Amiodarone which was later switched to Toprol XL. A repeat 2d echo revealed EF of 30-35%, diffuse hypokinesis, moderate to severe reduction of systolic function. He also developed a GI bleed with drop in hemoglobin  From 9.6 on admission to 6.5 for which he received 3 units PRBC and Xarelto was held temporarily. He underwent an EGD which revealed a large bleeding duodenal ulcer and positive H. Pylori testing. He was commenced on IV Protonix as well as Biaxin, Amoxicillin which were subsequently changed to Flagyl, Metronidazole and Doxycycline due to drug interaction. He was transferred to telemetry once his condition stabilized; Digoxin and Aldactone added to regimen and Lisinopril held. He was subsequently discharged.  Interval History: He states he is confused due to the multiple medications he has received and is unsure of what to take. Denies shortness of breath , abdominal pain or hematochezia. He is currently taking his antibiotics. He is sad today because he did not get to have the  apartment he was promised.   Past Medical History  Diagnosis Date  . Hypertension   . Noncompliance     homelessness contributing.   Marland Kitchen CAD (coronary artery disease) Sept 2013    s/p cardiac cath showing occlusion of small RCA with collaterals  . Chronic anticoagulation     on xarelto.   . Atrial fibrillation     RVR 10/2014  . Peripheral arterial disease   . Automatic implantable cardioverter-defibrillator in situ   . High cholesterol   . Myocardial infarction 2014  . Type II diabetes mellitus   . CHF (congestive heart failure)     20 to 25 % EF and RV dysfunction by 07/2014 echo     Past Surgical History  Procedure Laterality Date  . Implantable cardioverter defibrillator implant      [place] in 07/2012; [employer]  . Coronary artery bypass graft  09/2012    2 vessels per patient [name]   . Coronary angioplasty with stent placement  11/2011    "1"  . Cardiac catheterization  09/2012  . Iliac artery stent Right 08/30/2013  . Left and right heart catheterization with coronary angiogram N/A 12/02/2011    Procedure: LEFT AND RIGHT HEART CATHETERIZATION WITH CORONARY ANGIOGRAM;  Surgeon: Kathleene Hazel, MD;  Location: [facility] CATH LAB;  Service: Cardiovascular;  Laterality: N/A;  . Lower extremity angiogram N/A 08/30/2013    Procedure: LOWER EXTREMITY ANGIOGRAM;  Surgeon: Runell Gess, MD;  Location: [facility] CATH LAB;  Service: Cardiovascular;  Laterality: N/A;  . Lower extremity angiogram N/A 12/02/2013  Procedure: LOWER EXTREMITY ANGIOGRAM;  Surgeon: Runell Gess, MD;  Location: [facility] CATH LAB;  Service: Cardiovascular;  Laterality: N/A;  . Esophagogastroduodenoscopy N/A 11/28/2014    Procedure: ESOPHAGOGASTRODUODENOSCOPY (EGD);  Surgeon: Beverley Fiedler, MD;  Location: [facility] ENDOSCOPY;  Service: Endoscopy;  Laterality: N/A;    Social History   Social History  . Marital Status: Divorced    Spouse Name: N/A  . Number of Children: 3  . Years of Education: N/A   Occupational  History  . Unemployed    Social History Main Topics  . Smoking status: Current Every Day Smoker -- 0.50 packs/day for 38 years    Types: Cigarettes  . Smokeless tobacco: Never Used  . Alcohol Use: No  . Drug Use: No  . Sexual Activity: Yes   Other Topics Concern  . Not on file   Social History Narrative   Has an apartment with a roommate. He was living on the streets in 26-Dec-2012.  He reports that his father died in [place] in 12-26-12.  He is divorced.  He is no longer estranged from his son, but still from his daughter who lives locally.  Neither of his parents, nor any siblings have any history of CAD.    Allergies  Allergen Reactions  . Tape Itching    Paper tape please.     Outpatient Prescriptions Prior to Visit  Medication Sig Dispense Refill  . albuterol (PROVENTIL HFA;VENTOLIN HFA) 108 (90 BASE) MCG/ACT inhaler Inhale 2 puffs into the lungs every 6 (six) hours as needed for wheezing or shortness of breath. 1 Inhaler 0  . albuterol (PROVENTIL) (2.5 MG/3ML) 0.083% nebulizer solution Take 3 mLs (2.5 mg total) by nebulization every 6 (six) hours as needed for wheezing or shortness of breath. 150 mL 1  . amoxicillin (AMOXIL) 500 MG capsule Take 2 capsules (1,000 mg total) by mouth 2 (two) times daily. 26 capsule 0  . Digoxin 62.5 MCG TABS Take 0.0625 mg by mouth daily. 30 tablet 0  . doxycycline (VIBRA-TABS) 100 MG tablet Take 1 tablet (100 mg total) by mouth 2 (two) times daily. 14 tablet 0  . furosemide (LASIX) 40 MG tablet Take 1 tablet (40 mg total) by mouth daily. 30 tablet 0  . glipiZIDE (GLUCOTROL) 10 MG tablet Take 1 tablet (10 mg total) by mouth 2 (two) times daily before a meal. 60 tablet 2  . rivaroxaban (XARELTO) 20 MG TABS tablet Take 1 tablet (20 mg total) by mouth daily before supper. 30 tablet 6  . spironolactone (ALDACTONE) 25 MG tablet Take 0.5 tablets (12.5 mg total) by mouth daily. 30 tablet 0  . atorvastatin (LIPITOR) 40 MG tablet Take 1 tablet (40  mg total) by mouth daily at 6 PM. 30 tablet 5  . docusate sodium (COLACE) 100 MG capsule Take 1 capsule (100 mg total) by mouth daily. 30 capsule 5  . ferrous sulfate 325 (65 FE) MG tablet Take 1 tablet (325 mg total) by mouth 3 (three) times daily with meals. 90 tablet 5  . metoprolol succinate (TOPROL-XL) 25 MG 24 hr tablet Take 1 tablet (25 mg total) by mouth 2 (two) times daily. 60 tablet 0  . metroNIDAZOLE (FLAGYL) 250 MG tablet Take 1 tablet (250 mg total) by mouth 4 (four) times daily. 28 tablet 0  . pantoprazole (PROTONIX) 40 MG tablet Take 1 tablet (40 mg total) by mouth 2 (two) times daily. 60 tablet 0  . senna-docusate (SENOKOT-S) 8.6-50 MG per tablet Take 1  tablet by mouth at bedtime. 30 tablet 0  . traMADol (ULTRAM) 50 MG tablet Take 1 tablet (50 mg total) by mouth every 8 (eight) hours as needed for moderate pain. (Patient not taking: Reported on 12/12/2014) 30 tablet 0   No facility-administered medications prior to visit.    ROS Review of Systems  Constitutional: Negative for activity change and appetite change.  HENT: Negative for sinus pressure and sore throat.   Eyes: Negative for visual disturbance.  Respiratory: Negative for cough, chest tightness and shortness of breath.   Cardiovascular: Negative for chest pain and leg swelling.       Claudication pains  Gastrointestinal: Negative for abdominal pain, diarrhea, constipation and abdominal distention.  Endocrine: Negative.   Genitourinary: Negative for dysuria.  Musculoskeletal: Negative for myalgias and joint swelling.  Skin: Negative for rash.  Allergic/Immunologic: Negative.   Neurological: Negative for weakness, light-headedness and numbness.  Psychiatric/Behavioral: Positive for dysphoric mood (due to the fact that he did not get the apartment he was promised). Negative for suicidal ideas.    Objective:  BP 117/77 mmHg  Pulse 101  Temp(Src) 98.1 F (36.7 C)  Ht  (1.626 m)  Wt 218 lb (98.884 kg)  BMI  37.40 kg/m2  SpO2 97%  BP/Weight 12/12/2014 12/05/2014 11/23/2014  Systolic BP 117 108 -  Diastolic BP 77 52 -  Wt. (Lbs) 218 203.9 -  BMI 37.4 - 34.98   CMP Latest Ref Rng 12/05/2014 12/04/2014 12/03/2014  Glucose 65 - 99 mg/dL 782(N) 95 98  BUN 6 - 20 mg/dL 56(O) 13(Y) 86(V)  Creatinine 0.61 - 1.24 mg/dL 7.84 6.96(E) 9.52(W)  Sodium 135 - 145 mmol/L 130(L) 133(L) 128(L)  Potassium 3.5 - 5.1 mmol/L 4.8 3.7 4.5  Chloride 101 - 111 mmol/L 96(L) 93(L) 94(L)  CO2 22 - 32 mmol/L Calcium 8.9 - 10.3 mg/dL 8.3(L) 8.6(L) 8.4(L)  Total Protein 6.5 - 8.1 g/dL - - -  Total Bilirubin 0.3 - 1.2 mg/dL - - -  Alkaline Phos 38 - 126 U/L - - -  AST 15 - 41 U/L - - -  ALT 17 - 63 U/L - - -     CBC Latest Ref Rng 12/05/2014 12/03/2014 12/02/2014  WBC 4.0 - 10.5 K/uL 7.1 7.7 9.2  Hemoglobin 13.0 - 17.0 g/dL 8.2(L) 8.9(L) 9.2(L)  Hematocrit 39.0 - 52.0 % 27.1(L) 29.3(L) 29.4(L)  Platelets 150 - 400 K/uL 255 216 201     BNP (last 3 results)  Recent Labs  10/29/14 1540 11/11/14 1833 11/23/14 0328  BNP 1232.4* 807.3* 866.8*    ProBNP (last 3 results)  Recent Labs  09/06/14 1107 09/20/14 1201 10/04/14 1256  PROBNP 1577.00* 3006.00* 2524.00*      Physical Exam  Constitutional: He is oriented to person, place, and time. He appears well-developed and well-nourished.  HENT:  Head: Normocephalic and atraumatic.  Right Ear: External ear normal.  Left Ear: External ear normal.  Eyes: Conjunctivae and EOM are normal. Pupils are equal, round, and reactive to light.  Neck: Normal range of motion. Neck supple. No tracheal deviation present.  Cardiovascular: Normal rate and normal heart sounds.  An irregularly irregular rhythm present.  No murmur heard. Pulmonary/Chest: Effort normal and breath sounds normal. No respiratory distress. He has no wheezes. He exhibits no tenderness.  Abdominal: Soft. Bowel sounds are normal. He exhibits no mass. There is no tenderness.  Musculoskeletal:  Normal range of motion. He exhibits no edema or tenderness.  Neurological: He  is alert and oriented to person, place, and time.  Skin: Skin is warm and dry.  Psychiatric: He has a normal mood and affect.     Assessment & Plan:   1. Type 2 diabetes mellitus with other circulatory complication Uncontrolled with A1c of 8.2 ; due for A1c at next visit No changes today until A1c is obtained next week. - Glucose (CBG) - Basic Metabolic Panel  2. Essential hypertension Controlled Continue medication  3. Chronic atrial fibrillation Remains on anticoagulation with Xarelto; rate control with Metoprolol - Pro b natriuretic peptide - Digoxin level  4. PVD (peripheral vascular disease) He is scheduled to see Vein and Vascular specialist of  Missed his appointment for today and so my nurse called to confirm an appointment for next month  5. Gastrointestinal hemorrhage, unspecified gastritis, unspecified gastrointestinal hemorrhage type Asymptomatic - CBC with Differential  6. Duodenal ulcer Asymtomatic  7. Helicobacter pylori ab+ Continue antibiotics- Amoxicillin, Doxycycline and Protonix and Metronidazole   8. Acute exacerbation of CHF (congestive heart failure) EF 30-35%, s/p AICD No evidence of fluid overload. Continue Lasix Digoxin level ordered Daily weights, limit fluids to 2L/day, low sodium, DASH, heart healthy diet Advised to keep appointment with Cardiology  Advised to bring in all his medications for medication reconciliation as he admits to being confused from the several medications he is taking. He is also overwhelmed due to the numerous appointments he has; case managers from the clinic will call him frequently to remind him.  Meds ordered this encounter  Medications  . traMADol (ULTRAM) 50 MG tablet    Sig: Take 1 tablet (50 mg total) by mouth every 8 (eight) hours as needed for moderate pain.    Dispense:  30 tablet    Refill:  0    Follow-up:  Return in about 1 week (around 12/19/2014) for TCC follow up with Dr Venetia Night.   Jaclyn Shaggy MD

## 2014-12-12 NOTE — Progress Notes (Signed)
Patient states he does not feel well but cannot pinpoint why He has 8/10 bilateral leg pain that is chronic in nature He is out of his Tramadol He is unsure about which medications that he is taking since leaving the hospital He did not bring his medications they are at a friends house

## 2014-12-13 ENCOUNTER — Telehealth: Payer: Self-pay

## 2014-12-13 LAB — CBC WITH DIFFERENTIAL/PLATELET
Basophils Absolute: 0.1 10*3/uL (ref 0.0–0.1)
Basophils Relative: 1 % (ref 0–1)
EOS ABS: 0.1 10*3/uL (ref 0.0–0.7)
EOS PCT: 1 % (ref 0–5)
HEMATOCRIT: 25.4 % — AB (ref 39.0–52.0)
Hemoglobin: 7.7 g/dL — ABNORMAL LOW (ref 13.0–17.0)
LYMPHS ABS: 1.2 10*3/uL (ref 0.7–4.0)
LYMPHS PCT: 14 % (ref 12–46)
MCH: 21.9 pg — AB (ref 26.0–34.0)
MCHC: 30.3 g/dL (ref 30.0–36.0)
MCV: 72.4 fL — AB (ref 78.0–100.0)
MONO ABS: 0.9 10*3/uL (ref 0.1–1.0)
MONOS PCT: 11 % (ref 3–12)
MPV: 8.8 fL (ref 8.6–12.4)
Neutro Abs: 6.2 10*3/uL (ref 1.7–7.7)
Neutrophils Relative %: 73 % (ref 43–77)
PLATELETS: 275 10*3/uL (ref 150–400)
RBC: 3.51 MIL/uL — ABNORMAL LOW (ref 4.22–5.81)
RDW: 25 % — AB (ref 11.5–15.5)
WBC: 8.5 10*3/uL (ref 4.0–10.5)

## 2014-12-13 LAB — BASIC METABOLIC PANEL
BUN: 18 mg/dL (ref 7–25)
CALCIUM: 8.4 mg/dL — AB (ref 8.6–10.3)
CHLORIDE: 99 mmol/L (ref 98–110)
CO2: 29 mmol/L (ref 20–31)
CREATININE: 0.86 mg/dL (ref 0.70–1.33)
Glucose, Bld: 203 mg/dL — ABNORMAL HIGH (ref 65–99)
Potassium: 5 mmol/L (ref 3.5–5.3)
Sodium: 137 mmol/L (ref 135–146)

## 2014-12-13 LAB — PRO B NATRIURETIC PEPTIDE: Pro B Natriuretic peptide (BNP): 1892 pg/mL — ABNORMAL HIGH (ref <126)

## 2014-12-13 LAB — DIGOXIN LEVEL: DIGOXIN LVL: 0.5 ug/L — AB (ref 0.8–2.0)

## 2014-12-13 NOTE — Telephone Encounter (Signed)
Called the patient to check on the outcome of his meeting with Ms Bradly Bienenstock with the [facility] re.housing. He said that she does not have anything available for him yet and he is going to meet with her again on Friday, 12/16/14.  He continues to refuse to seek assistance at a shelter or the [facility] and he stated that he will continue to stay in his car. No other problems reported. He stated that he is doing "ok."

## 2014-12-14 ENCOUNTER — Telehealth: Payer: Self-pay

## 2014-12-14 ENCOUNTER — Encounter (HOSPITAL_COMMUNITY): Payer: Self-pay | Admitting: *Deleted

## 2014-12-14 ENCOUNTER — Encounter: Payer: Self-pay | Admitting: *Deleted

## 2014-12-14 ENCOUNTER — Emergency Department (HOSPITAL_COMMUNITY)
Admission: EM | Admit: 2014-12-14 | Discharge: 2014-12-14 | Disposition: A | Discharge status: Home / Self Care | Payer: Medicaid Other | Attending: Emergency Medicine | Admitting: Emergency Medicine

## 2014-12-14 ENCOUNTER — Other Ambulatory Visit: Payer: Self-pay | Admitting: Family Medicine

## 2014-12-14 DIAGNOSIS — E119 Type 2 diabetes mellitus without complications: Secondary | ICD-10-CM | POA: Insufficient documentation

## 2014-12-14 DIAGNOSIS — Z792 Long term (current) use of antibiotics: Secondary | ICD-10-CM | POA: Diagnosis not present

## 2014-12-14 DIAGNOSIS — Z79899 Other long term (current) drug therapy: Secondary | ICD-10-CM | POA: Insufficient documentation

## 2014-12-14 DIAGNOSIS — I252 Old myocardial infarction: Secondary | ICD-10-CM | POA: Diagnosis not present

## 2014-12-14 DIAGNOSIS — I4891 Unspecified atrial fibrillation: Secondary | ICD-10-CM | POA: Diagnosis not present

## 2014-12-14 DIAGNOSIS — Z7901 Long term (current) use of anticoagulants: Secondary | ICD-10-CM | POA: Insufficient documentation

## 2014-12-14 DIAGNOSIS — E780 Pure hypercholesterolemia: Secondary | ICD-10-CM | POA: Diagnosis not present

## 2014-12-14 DIAGNOSIS — K269 Duodenal ulcer, unspecified as acute or chronic, without hemorrhage or perforation: Secondary | ICD-10-CM | POA: Insufficient documentation

## 2014-12-14 DIAGNOSIS — R5383 Other fatigue: Secondary | ICD-10-CM | POA: Insufficient documentation

## 2014-12-14 DIAGNOSIS — I509 Heart failure, unspecified: Secondary | ICD-10-CM | POA: Insufficient documentation

## 2014-12-14 DIAGNOSIS — I2510 Atherosclerotic heart disease of native coronary artery without angina pectoris: Secondary | ICD-10-CM | POA: Insufficient documentation

## 2014-12-14 DIAGNOSIS — I10 Essential (primary) hypertension: Secondary | ICD-10-CM | POA: Insufficient documentation

## 2014-12-14 DIAGNOSIS — Z590 Homelessness: Secondary | ICD-10-CM | POA: Diagnosis not present

## 2014-12-14 DIAGNOSIS — Z9119 Patient's noncompliance with other medical treatment and regimen: Secondary | ICD-10-CM | POA: Diagnosis not present

## 2014-12-14 DIAGNOSIS — Z720 Tobacco use: Secondary | ICD-10-CM | POA: Insufficient documentation

## 2014-12-14 DIAGNOSIS — Z951 Presence of aortocoronary bypass graft: Secondary | ICD-10-CM | POA: Insufficient documentation

## 2014-12-14 DIAGNOSIS — Z9889 Other specified postprocedural states: Secondary | ICD-10-CM | POA: Insufficient documentation

## 2014-12-14 DIAGNOSIS — Z9861 Coronary angioplasty status: Secondary | ICD-10-CM | POA: Insufficient documentation

## 2014-12-14 DIAGNOSIS — D649 Anemia, unspecified: Secondary | ICD-10-CM | POA: Diagnosis not present

## 2014-12-14 DIAGNOSIS — Z95810 Presence of automatic (implantable) cardiac defibrillator: Secondary | ICD-10-CM | POA: Diagnosis not present

## 2014-12-14 DIAGNOSIS — R7989 Other specified abnormal findings of blood chemistry: Secondary | ICD-10-CM | POA: Diagnosis present

## 2014-12-14 LAB — I-STAT CHEM 8, ED
BUN: 24 mg/dL — AB (ref 6–20)
Calcium, Ion: 1.14 mmol/L (ref 1.12–1.23)
Chloride: 99 mmol/L — ABNORMAL LOW (ref 101–111)
Creatinine, Ser: 1 mg/dL (ref 0.61–1.24)
Glucose, Bld: 238 mg/dL — ABNORMAL HIGH (ref 65–99)
HEMATOCRIT: 27 % — AB (ref 39.0–52.0)
Hemoglobin: 9.2 g/dL — ABNORMAL LOW (ref 13.0–17.0)
POTASSIUM: 4.3 mmol/L (ref 3.5–5.1)
SODIUM: 135 mmol/L (ref 135–145)
TCO2: 23 mmol/L (ref 0–100)

## 2014-12-14 LAB — COMPREHENSIVE METABOLIC PANEL
ALT: 14 U/L — AB (ref 17–63)
ANION GAP: 7 (ref 5–15)
AST: 24 U/L (ref 15–41)
Albumin: 3.1 g/dL — ABNORMAL LOW (ref 3.5–5.0)
Alkaline Phosphatase: 114 U/L (ref 38–126)
BUN: 21 mg/dL — ABNORMAL HIGH (ref 6–20)
CHLORIDE: 100 mmol/L — AB (ref 101–111)
CO2: 24 mmol/L (ref 22–32)
CREATININE: 1.04 mg/dL (ref 0.61–1.24)
Calcium: 8.2 mg/dL — ABNORMAL LOW (ref 8.9–10.3)
Glucose, Bld: 236 mg/dL — ABNORMAL HIGH (ref 65–99)
POTASSIUM: 4.4 mmol/L (ref 3.5–5.1)
SODIUM: 131 mmol/L — AB (ref 135–145)
Total Bilirubin: 0.7 mg/dL (ref 0.3–1.2)
Total Protein: 6.7 g/dL (ref 6.5–8.1)

## 2014-12-14 LAB — TYPE AND SCREEN
ABO/RH(D): O POS
ANTIBODY SCREEN: NEGATIVE

## 2014-12-14 LAB — CBC
HCT: 25.7 % — ABNORMAL LOW (ref 39.0–52.0)
Hemoglobin: 7.6 g/dL — ABNORMAL LOW (ref 13.0–17.0)
MCH: 21.7 pg — AB (ref 26.0–34.0)
MCHC: 29.6 g/dL — AB (ref 30.0–36.0)
MCV: 73.2 fL — AB (ref 78.0–100.0)
PLATELETS: 268 10*3/uL (ref 150–400)
RBC: 3.51 MIL/uL — ABNORMAL LOW (ref 4.22–5.81)
RDW: 24.3 % — AB (ref 11.5–15.5)
WBC: 7.3 10*3/uL (ref 4.0–10.5)

## 2014-12-14 LAB — POC OCCULT BLOOD, ED: FECAL OCCULT BLD: NEGATIVE

## 2014-12-14 MED ORDER — FUROSEMIDE 40 MG PO TABS: 40.0000 mg | ORAL_TABLET | Freq: Two times a day (BID) | ORAL | Status: DC

## 2014-12-14 NOTE — ED Provider Notes (Signed)
CSN: [id number]     Arrival date & time 12/14/14  1143 History   First MD Initiated Contact with Patient 12/14/14 1146     Chief Complaint  Patient presents with  . Abnormal Lab    HPI  Jacob [name] is a 59 y.o. male presenting to ED per request of his PCP. States that doctor told him that he had an abnormal lab that needs to be evaluated. Patient states that doctor is concerned he is still bleeding. He is denying any current symptoms including bleeding. No hematemesis and no blood per rectum. However he does endorse having dark black stool. She does use a blood thinner for A.fib. Patient states that he does feel fatigued but denies any lightheadedness, dizziness, excessive cold, or palpitations.   Past Medical History  Diagnosis Date  . Hypertension   . Noncompliance     homelessness contributing.   Marland Kitchen CAD (coronary artery disease) Sept 2013    s/p cardiac cath showing occlusion of small RCA with collaterals  . Chronic anticoagulation     on xarelto.   . Atrial fibrillation     RVR 10/2014  . Peripheral arterial disease   . Automatic implantable cardioverter-defibrillator in situ   . High cholesterol   . Myocardial infarction 2014  . Type II diabetes mellitus   . CHF (congestive heart failure)     20 to 25 % EF and RV dysfunction by 07/2014 echo    Past Surgical History  Procedure Laterality Date  . Implantable cardioverter defibrillator implant      [place] in 07/2012; [employer]  . Coronary artery bypass graft  09/2012    2 vessels per patient [name]   . Coronary angioplasty with stent placement  11/2011    "1"  . Cardiac catheterization  09/2012  . Iliac artery stent Right 08/30/2013  . Left and right heart catheterization with coronary angiogram N/A 12/02/2011    Procedure: LEFT AND RIGHT HEART CATHETERIZATION WITH CORONARY ANGIOGRAM;  Surgeon: Kathleene Hazel, MD;  Location: [facility] CATH LAB;  Service: Cardiovascular;  Laterality: N/A;  . Lower extremity angiogram N/A  08/30/2013    Procedure: LOWER EXTREMITY ANGIOGRAM;  Surgeon: Runell Gess, MD;  Location: [facility] CATH LAB;  Service: Cardiovascular;  Laterality: N/A;  . Lower extremity angiogram N/A 12/02/2013    Procedure: LOWER EXTREMITY ANGIOGRAM;  Surgeon: Runell Gess, MD;  Location: [facility] CATH LAB;  Service: Cardiovascular;  Laterality: N/A;  . Esophagogastroduodenoscopy N/A 11/28/2014    Procedure: ESOPHAGOGASTRODUODENOSCOPY (EGD);  Surgeon: Beverley Fiedler, MD;  Location: [facility] ENDOSCOPY;  Service: Endoscopy;  Laterality: N/A;   Family History  Problem Relation Age of Onset  . Diabetes Mother    Social History  Substance Use Topics  . Smoking status: Current Every Day Smoker -- 0.50 packs/day for 38 years    Types: Cigarettes  . Smokeless tobacco: Never Used  . Alcohol Use: No    Review of Systems  Constitutional: Positive for fatigue. Negative for fever and appetite change.  Respiratory: Negative for shortness of breath.   Cardiovascular: Negative for chest pain.  Gastrointestinal: Negative for abdominal pain and blood in stool.  Endocrine: Negative for cold intolerance.  Neurological: Negative for dizziness and light-headedness.  Also per HPI  Allergies  Tape  Home Medications   Prior to Admission medications   Medication Sig Start Date End Date Taking? Authorizing Provider  albuterol (PROVENTIL HFA;VENTOLIN HFA) 108 (90 BASE) MCG/ACT inhaler Inhale 2 puffs into the lungs every 6 (  six) hours as needed for wheezing or shortness of breath. 09/30/14   [name], MD  albuterol (PROVENTIL) (2.5 MG/3ML) 0.083% nebulizer solution Take 3 mLs (2.5 mg total) by nebulization every 6 (six) hours as needed for wheezing or shortness of breath. 09/06/14   Jaclyn Shaggy, MD  amoxicillin (AMOXIL) 500 MG capsule Take 2 capsules (1,000 mg total) by mouth 2 (two) times daily. 12/05/14   Hyacinth Meeker, MD  atorvastatin (LIPITOR) 40 MG tablet Take 1 tablet (40 mg total) by mouth daily at 6 PM. 12/05/14   Tasrif  Ahmed, MD  Digoxin 62.5 MCG TABS Take 0.0625 mg by mouth daily. 12/05/14   Tasrif Ahmed, MD  docusate sodium (COLACE) 100 MG capsule Take 1 capsule (100 mg total) by mouth daily. 10/13/14   Courtney Paris, MD  doxycycline (VIBRA-TABS) 100 MG tablet Take 1 tablet (100 mg total) by mouth 2 (two) times daily. 12/06/14   Gust Rung, DO  ferrous sulfate 325 (65 FE) MG tablet Take 1 tablet (325 mg total) by mouth 3 (three) times daily with meals. 10/13/14   Courtney Paris, MD  furosemide (LASIX) 40 MG tablet Take 1 tablet (40 mg total) by mouth 2 (two) times daily. 12/14/14   Jaclyn Shaggy, MD  glipiZIDE (GLUCOTROL) 10 MG tablet Take 1 tablet (10 mg total) by mouth 2 (two) times daily before a meal. 11/02/14   Jaclyn Shaggy, MD  metoprolol succinate (TOPROL-XL) 25 MG 24 hr tablet Take 1 tablet (25 mg total) by mouth 2 (two) times daily. 12/05/14   Tasrif Ahmed, MD  pantoprazole (PROTONIX) 40 MG tablet Take 1 tablet (40 mg total) by mouth 2 (two) times daily. 12/03/14   Hyacinth Meeker, MD  rivaroxaban (XARELTO) 20 MG TABS tablet Take 1 tablet (20 mg total) by mouth daily before supper. 10/13/14   Courtney Paris, MD  senna-docusate (SENOKOT-S) 8.6-50 MG per tablet Take 1 tablet by mouth at bedtime. 12/05/14   Hyacinth Meeker, MD  spironolactone (ALDACTONE) 25 MG tablet Take 0.5 tablets (12.5 mg total) by mouth daily. 12/05/14   Tasrif Ahmed, MD  traMADol (ULTRAM) 50 MG tablet Take 1 tablet (50 mg total) by mouth every 8 (eight) hours as needed for moderate pain. 12/12/14   Jaclyn Shaggy, MD   BP 115/76 mmHg  Pulse 94  Temp(Src) 98.3 F (36.8 C) (Oral)  Resp 25  SpO2 100% Physical Exam  Constitutional: He is oriented to person, place, and time. He appears well-developed and well-nourished.  HENT:  Head: Normocephalic and atraumatic.  Mouth/Throat: Oropharynx is clear and moist.  Eyes: EOM are normal.  Cardiovascular: Normal rate, regular rhythm and intact distal pulses.   Pulmonary/Chest: Effort normal and breath sounds  normal.  Abdominal: Soft. Bowel sounds are normal. There is no tenderness. There is no guarding.  Musculoskeletal: Normal range of motion. He exhibits no edema.  Neurological: He is alert and oriented to person, place, and time.  Skin: Skin is warm and dry.  Psychiatric: He has a normal mood and affect.    ED Course  Procedures (including critical care time) Labs Review Labs Reviewed  CBC - Abnormal; Notable for the following:    RBC 3.51 (*)    Hemoglobin 7.6 (*)    HCT 25.7 (*)    MCV 73.2 (*)    MCH 21.7 (*)    MCHC 29.6 (*)    RDW 24.3 (*)    All other components within normal limits  COMPREHENSIVE METABOLIC PANEL -  Abnormal; Notable for the following:    Sodium 131 (*)    Chloride 100 (*)    Glucose, Bld 236 (*)    BUN 21 (*)    Calcium 8.2 (*)    Albumin 3.1 (*)    ALT 14 (*)    All other components within normal limits  I-STAT CHEM 8, ED - Abnormal; Notable for the following:    Chloride 99 (*)    BUN 24 (*)    Glucose, Bld 238 (*)    Hemoglobin 9.2 (*)    HCT 27.0 (*)    All other components within normal limits  POC OCCULT BLOOD, ED  TYPE AND SCREEN    Imaging Review No results found. I have personally reviewed and evaluated these images and lab results as part of my medical decision-making.   EKG Interpretation None      MDM   Final diagnoses:  Anemia, unspecified anemia type  Duodenal ulcer   Patient presented to the ED due to PCP concern for continued bleeding and worsening anemia. Patient is asymptomatic and vitals are stable.  Labs ordered in ED remained stable. No worsening of his anemia. Occult stool card negative for any blood.  Discussed lab results with [facility] nurse. They were okay with discharging patient with follow-up in a week. Patient discharged with return precautions.   Caryl Ada, DO 12/14/2014, 2:15 PM PGY-2, [facility]    Pincus Large, DO 12/14/14 1418  Melene Plan,  DO 12/14/14 [phone]

## 2014-12-14 NOTE — ED Notes (Signed)
Pt given ice chips

## 2014-12-14 NOTE — Telephone Encounter (Signed)
Call placed to the patient at the request of Dr Venetia Night.  Informed him to increase his lasix to 40mg  twice a day and he stated that he would and he was able to correctly repeat the order.  Also informed him that he needs to go to the ED for a transfusion because his hemoglobin is very low and he could possibly still be bleeding.  Instructed him to inform the ED that his doctor instructed him to go to the ED. He verbalized understanding and stated that he would go to the [facility] ED now.

## 2014-12-14 NOTE — Progress Notes (Signed)
Patient ID: Jacob [name], male   DOB: [birth year], 59 y.o.   MRN: [id number]  Dr. Venetia Night had advised patient to go to [facility] ED due to his hemoglobin dropping.  Patient went to ED and MD phoned to say that patient's labs are stable and he is not bleeding currently so they are discharging him with instructions to follow up with Dr. Venetia Night in 1 week.

## 2014-12-14 NOTE — Discharge Instructions (Signed)
Blood work in ED was stable. No signs of active bleeding. Continue your medications as prescribed You need to follow-up with your PCP office in about one week to make sure you are still not losing blood Seek help immediately if you start noticing bleeding, get short of breath, chest pain, feel faint/dizzy or any of the below reasons to seek immediate help  Anemia, Nonspecific Anemia is a condition in which the concentration of red blood cells or hemoglobin in the blood is below normal. Hemoglobin is a substance in red blood cells that carries oxygen to the tissues of the body. Anemia results in not enough oxygen reaching these tissues.  CAUSES  Common causes of anemia include:   Excessive bleeding. Bleeding may be internal or external. This includes excessive bleeding from periods (in women) or from the intestine.   Poor nutrition.   Chronic kidney, thyroid, and liver disease.  Bone marrow disorders that decrease red blood cell production.  Cancer and treatments for cancer.  HIV, AIDS, and their treatments.  Spleen problems that increase red blood cell destruction.  Blood disorders.  Excess destruction of red blood cells due to infection, medicines, and autoimmune disorders. SIGNS AND SYMPTOMS   Minor weakness.   Dizziness.   Headache.  Palpitations.   Shortness of breath, especially with exercise.   Paleness.  Cold sensitivity.  Indigestion.  Nausea.  Difficulty sleeping.  Difficulty concentrating. Symptoms may occur suddenly or they may develop slowly.  DIAGNOSIS  Additional blood tests are often needed. These help your health care provider determine the best treatment. Your health care provider will check your stool for blood and look for other causes of blood loss.  TREATMENT  Treatment varies depending on the cause of the anemia. Treatment can include:   Supplements of iron, vitamin B12, or folic acid.   Hormone medicines.   A blood  transfusion. This may be needed if blood loss is severe.   Hospitalization. This may be needed if there is significant continual blood loss.   Dietary changes.  Spleen removal. HOME CARE INSTRUCTIONS Keep all follow-up appointments. It often takes many weeks to correct anemia, and having your health care provider check on your condition and your response to treatment is very important. SEEK IMMEDIATE MEDICAL CARE IF:   You develop extreme weakness, shortness of breath, or chest pain.   You become dizzy or have trouble concentrating.  You develop heavy vaginal bleeding.   You develop a rash.   You have bloody or black, tarry stools.   You faint.   You vomit up blood.   You vomit repeatedly.   You have abdominal pain.  You have a fever or persistent symptoms for more than 2-3 days.   You have a fever and your symptoms suddenly get worse.   You are dehydrated.  MAKE SURE YOU:  Understand these instructions.  Will watch your condition.  Will get help right away if you are not doing well or get worse. Document Released: 04/18/2004 Document Revised: 11/11/2012 Document Reviewed: 09/04/2012 [facility] Patient Information 2015 [place], [place]. This information is not intended to replace advice given to you by your health care provider. Make sure you discuss any questions you have with your health care provider.

## 2014-12-14 NOTE — ED Notes (Signed)
Pt was sent here by his MD for blood transfusion. Pt has no other complaints.

## 2014-12-15 ENCOUNTER — Telehealth: Payer: Self-pay

## 2014-12-15 ENCOUNTER — Inpatient Hospital Stay (HOSPITAL_COMMUNITY): Admit: 2014-12-15 | Payer: Medicaid Other

## 2014-12-15 NOTE — Telephone Encounter (Signed)
This Case Manager placed call to patient to check on status. Patient indicated he was feeling "good". Patient denied shortness of breath or swelling in lower extremities. Reiterated that Dr. Venetia Night wants him to take Lasix 40 mg bid. Patient verbalized understanding and indicated he is taking medication twice daily now. He indicated he would like to go over medications again so he knows what to take and when.  Informed patient that this Case Manager would go over his medication list with him; however, patient indicated he was out and did not have his medications with him. He indicated he would call this Case Manager back tomorrow-12/16/14 so medication list could be reviewed. Reminded patient of upcoming appointments: 12/19/14 at 1500 with Heart and Vascular and appointment on 12/20/14 at 1215 with Dr. Venetia Night.  Patient verbalized understanding.

## 2014-12-16 ENCOUNTER — Telehealth: Payer: Self-pay

## 2014-12-16 ENCOUNTER — Encounter: Payer: Self-pay | Admitting: Internal Medicine

## 2014-12-16 NOTE — Telephone Encounter (Signed)
This Case Manager spoke with Lucio Edward, Case Manager with [facility], who indicated patient is known to her. She indicated she would call patient on Monday to set-up a time to meet with patient and set up pill box and also go over medication list.  She requested updated medication list. Information faxed to [phone].

## 2014-12-16 NOTE — Telephone Encounter (Signed)
This Case Manager spoke with patient once again as patient stated on 12/15/14 he wanted to review his medication list on 12/16/14. Patient again indicated he did not have his medications with him.  Spoke with Dr. Venetia Night who indicated she would review his medications with him at his appointment on 12/20/14. Patient also followed by [facility].  Call placed to Selena Batten [phone]) to determine if Case Manager would be able to follow-up with patient for medication management.  Awaiting return call from [facility] with [facility].

## 2014-12-19 ENCOUNTER — Telehealth: Payer: Self-pay

## 2014-12-19 ENCOUNTER — Inpatient Hospital Stay (HOSPITAL_COMMUNITY): Admission: RE | Admit: 2014-12-19 | Payer: Medicaid Other | Source: Ambulatory Visit

## 2014-12-19 NOTE — Telephone Encounter (Signed)
Call placed to the patient to check on his status and to remind him of his appointment tomorrow, 12/20/14. He said that he is " doing good."  He noted that he was supposed to go to his cardiology appt this morning; but he did not know how to park so he didn't get to the appointment. He rescheduled the cardiology appt for tomorrow, 12/20/14 @ 1130, he then rescheduled his [facility] appt to 12/20/14 @ 1515.   He said that he is taking the lasix 40 mg BID as instructed. He reported that his breathing is " ok" but  he still has some lower extremity edema.    He also reported that he saw an apartment that is available that he likes and he signed the application 3 days ago and is now waiting on the back ground check and approval to move in.   No other problems/ concerns reported. Instructed him to bring his medications to his appt and he stated that he would.

## 2014-12-20 ENCOUNTER — Inpatient Hospital Stay: Payer: Medicaid Other | Admitting: Family Medicine

## 2014-12-20 ENCOUNTER — Ambulatory Visit: Payer: Medicaid Other | Admitting: Family Medicine

## 2014-12-20 ENCOUNTER — Ambulatory Visit (HOSPITAL_COMMUNITY)
Admission: RE | Admit: 2014-12-20 | Discharge: 2014-12-20 | Disposition: A | Discharge status: Home / Self Care | Payer: Medicaid Other | Source: Ambulatory Visit | Attending: Internal Medicine | Admitting: Internal Medicine

## 2014-12-20 ENCOUNTER — Encounter: Payer: Self-pay | Admitting: Licensed Clinical Social Worker

## 2014-12-20 VITALS — BP 118/74 | HR 90 | Wt 217.0 lb

## 2014-12-20 DIAGNOSIS — K264 Chronic or unspecified duodenal ulcer with hemorrhage: Secondary | ICD-10-CM | POA: Diagnosis not present

## 2014-12-20 DIAGNOSIS — Z5900 Homelessness unspecified: Secondary | ICD-10-CM

## 2014-12-20 DIAGNOSIS — Z79899 Other long term (current) drug therapy: Secondary | ICD-10-CM | POA: Diagnosis not present

## 2014-12-20 DIAGNOSIS — E119 Type 2 diabetes mellitus without complications: Secondary | ICD-10-CM | POA: Diagnosis not present

## 2014-12-20 DIAGNOSIS — Z95810 Presence of automatic (implantable) cardiac defibrillator: Secondary | ICD-10-CM | POA: Diagnosis not present

## 2014-12-20 DIAGNOSIS — I5022 Chronic systolic (congestive) heart failure: Secondary | ICD-10-CM | POA: Diagnosis not present

## 2014-12-20 DIAGNOSIS — E1159 Type 2 diabetes mellitus with other circulatory complications: Secondary | ICD-10-CM | POA: Diagnosis not present

## 2014-12-20 DIAGNOSIS — I5023 Acute on chronic systolic (congestive) heart failure: Secondary | ICD-10-CM

## 2014-12-20 DIAGNOSIS — I482 Chronic atrial fibrillation: Secondary | ICD-10-CM | POA: Diagnosis not present

## 2014-12-20 DIAGNOSIS — E780 Pure hypercholesterolemia: Secondary | ICD-10-CM | POA: Diagnosis not present

## 2014-12-20 DIAGNOSIS — F17210 Nicotine dependence, cigarettes, uncomplicated: Secondary | ICD-10-CM | POA: Insufficient documentation

## 2014-12-20 DIAGNOSIS — I4891 Unspecified atrial fibrillation: Secondary | ICD-10-CM | POA: Diagnosis not present

## 2014-12-20 DIAGNOSIS — I10 Essential (primary) hypertension: Secondary | ICD-10-CM | POA: Insufficient documentation

## 2014-12-20 DIAGNOSIS — I255 Ischemic cardiomyopathy: Secondary | ICD-10-CM | POA: Insufficient documentation

## 2014-12-20 DIAGNOSIS — Z951 Presence of aortocoronary bypass graft: Secondary | ICD-10-CM | POA: Diagnosis not present

## 2014-12-20 DIAGNOSIS — Z833 Family history of diabetes mellitus: Secondary | ICD-10-CM | POA: Diagnosis not present

## 2014-12-20 DIAGNOSIS — I252 Old myocardial infarction: Secondary | ICD-10-CM | POA: Diagnosis not present

## 2014-12-20 DIAGNOSIS — I2510 Atherosclerotic heart disease of native coronary artery without angina pectoris: Secondary | ICD-10-CM | POA: Insufficient documentation

## 2014-12-20 DIAGNOSIS — I739 Peripheral vascular disease, unspecified: Secondary | ICD-10-CM | POA: Insufficient documentation

## 2014-12-20 DIAGNOSIS — Z7902 Long term (current) use of antithrombotics/antiplatelets: Secondary | ICD-10-CM | POA: Insufficient documentation

## 2014-12-20 LAB — BASIC METABOLIC PANEL
ANION GAP: 6 (ref 5–15)
BUN: 18 mg/dL (ref 6–20)
CALCIUM: 8.5 mg/dL — AB (ref 8.9–10.3)
CHLORIDE: 101 mmol/L (ref 101–111)
CO2: 27 mmol/L (ref 22–32)
Creatinine, Ser: 0.93 mg/dL (ref 0.61–1.24)
GFR calc non Af Amer: >60 mL/min (ref >60)
GLUCOSE: 234 mg/dL — AB (ref 65–99)
POTASSIUM: 4.5 mmol/L (ref 3.5–5.1)
Sodium: 134 mmol/L — ABNORMAL LOW (ref 135–145)

## 2014-12-20 LAB — BRAIN NATRIURETIC PEPTIDE: B Natriuretic Peptide: 480.7 pg/mL — ABNORMAL HIGH (ref 0.0–100.0)

## 2014-12-20 MED ORDER — METOLAZONE 5 MG PO TABS: 5.0000 mg | ORAL_TABLET | Freq: Every day | ORAL | Status: DC

## 2014-12-20 NOTE — Patient Instructions (Signed)
Labs today will call if abnormal  Take metolazone 5 mg today and tomorrow  Continue current medications  Will see back in 1 week

## 2014-12-20 NOTE — Progress Notes (Signed)
Patient ID: Jacob [name], male   DOB: [birth year], 59 y.o.   MRN: [id number] PCP: Primary HF Cardiologist: Dr Gala Romney   HPI: Jacob [name] is a 59 year old [place] male with history of systolic HF EF 20-25% via Echo 08/03/14 s/p INCEPTA ICD implant 6/14, RV dysfunction, CAD s/p CABG x 2 w/ RF MAZE 7/14 at [facility], DM type II, Chronic afib on xarelto, GI bleed 11/28/2014, and HLD.   Admitted the end of August with increased dyspnea on exertion. Later found to be in cardiogenic shock . At one point on dual pressors milrinone and norepi. Diuresed with IV lasix and transitioned to po lasix. Hospital course complicated by GI bleed and A fib RVR. Loaded on amio but later placed on toprol xl for rate control. On 9/5 had EGD with duodenal bleed with clip applied. He was discharged 9/12 with D/C weight 203 pounds.   He returns for HF follow up. Yesterday he talked to PCP and was instructed to increase lasix to 40 mg twice a day. Denies PND/Orthopnea. Mild dyspnea with exertion. Denies BRBPR. Not walking much due to leg edema. Taking all medications. Living in his car. Taking all medications. Eating fast food every day.  Hoping to get in low income housing soon. Does not weigh because he lives in his car.    SH: Lives in his car. Difficulty paying meds. Estranged from family   ROS: All systems negative except as listed in HPI, PMH and Problem List.  SH:  Social History   Social History  . Marital Status: Divorced    Spouse Name: N/A  . Number of Children: 3  . Years of Education: N/A   Occupational History  . Unemployed    Social History Main Topics  . Smoking status: Current Every Day Smoker -- 0.50 packs/day for 38 years    Types: Cigarettes  . Smokeless tobacco: Never Used  . Alcohol Use: No  . Drug Use: No  . Sexual Activity: Yes   Other Topics Concern  . Not on file   Social History Narrative   Has an apartment with a roommate. He was living on the streets in 18-Jan-2013.  He reports that  his father died in [place] in 01-18-2013.  He is divorced.  He is no longer estranged from his son, but still from his daughter who lives locally.  Neither of his parents, nor any siblings have any history of CAD.    FH:  Family History  Problem Relation Age of Onset  . Diabetes Mother     Past Medical History  Diagnosis Date  . Hypertension   . Noncompliance     homelessness contributing.   Marland Kitchen CAD (coronary artery disease) Sept 2013    s/p cardiac cath showing occlusion of small RCA with collaterals  . Chronic anticoagulation     on xarelto.   . Atrial fibrillation     RVR 10/2014  . Peripheral arterial disease   . Automatic implantable cardioverter-defibrillator in situ   . High cholesterol   . Myocardial infarction 2014  . Type II diabetes mellitus   . CHF (congestive heart failure)     20 to 25 % EF and RV dysfunction by 07/2014 echo     Current Outpatient Prescriptions  Medication Sig Dispense Refill  . albuterol (PROVENTIL HFA;VENTOLIN HFA) 108 (90 BASE) MCG/ACT inhaler Inhale 2 puffs into the lungs every 6 (six) hours as needed for wheezing or shortness of breath. 1 Inhaler 0  .  albuterol (PROVENTIL) (2.5 MG/3ML) 0.083% nebulizer solution Take 3 mLs (2.5 mg total) by nebulization every 6 (six) hours as needed for wheezing or shortness of breath. 150 mL 1  . amoxicillin (AMOXIL) 500 MG capsule Take 2 capsules (1,000 mg total) by mouth 2 (two) times daily. 26 capsule 0  . atorvastatin (LIPITOR) 40 MG tablet Take 1 tablet (40 mg total) by mouth daily at 6 PM. 30 tablet 5  . Digoxin 62.5 MCG TABS Take 0.0625 mg by mouth daily. 30 tablet 0  . docusate sodium (COLACE) 100 MG capsule Take 1 capsule (100 mg total) by mouth daily. 30 capsule 5  . doxycycline (VIBRA-TABS) 100 MG tablet Take 1 tablet (100 mg total) by mouth 2 (two) times daily. 14 tablet 0  . ferrous sulfate 325 (65 FE) MG tablet Take 1 tablet (325 mg total) by mouth 3 (three) times daily with meals. 90 tablet 5  .  furosemide (LASIX) 40 MG tablet Take 1 tablet (40 mg total) by mouth 2 (two) times daily. 60 tablet 2  . glipiZIDE (GLUCOTROL) 10 MG tablet Take 1 tablet (10 mg total) by mouth 2 (two) times daily before a meal. 60 tablet 2  . metoprolol succinate (TOPROL-XL) 25 MG 24 hr tablet Take 1 tablet (25 mg total) by mouth 2 (two) times daily. 60 tablet 0  . pantoprazole (PROTONIX) 40 MG tablet Take 1 tablet (40 mg total) by mouth 2 (two) times daily. 60 tablet 0  . rivaroxaban (XARELTO) 20 MG TABS tablet Take 1 tablet (20 mg total) by mouth daily before supper. 30 tablet 6  . senna-docusate (SENOKOT-S) 8.6-50 MG per tablet Take 1 tablet by mouth at bedtime. 30 tablet 0  . spironolactone (ALDACTONE) 25 MG tablet Take 25 mg by mouth daily.    . traMADol (ULTRAM) 50 MG tablet Take 1 tablet (50 mg total) by mouth every 8 (eight) hours as needed for moderate pain. 30 tablet 0   No current facility-administered medications for this encounter.    Filed Vitals:   12/20/14 1106  BP: 118/74  Pulse: 90  Weight: 217 lb (98.431 kg)  SpO2: 100%    PHYSICAL EXAM:  General:  Chronically ill appearing. No resp difficulty. Arrived in a wheelchair HEENT: normal Neck: supple. JVP to jaw . Carotids 2+ bilaterally; no bruits. No lymphadenopathy or thryomegaly appreciated. Cor: PMI normal. Irregular  rate & rhythm. No rubs, gallops or murmurs. Lungs: clear Abdomen: soft, nontender, nondistended. No hepatosplenomegaly. No bruits or masses. Good bowel sounds. Extremities: no cyanosis, clubbing, rash, R and LLE 2+ edema Neuro: alert & orientedx3, cranial nerves grossly intact. Moves all 4 extremities w/o difficulty. Affect pleasant.    ASSESSMENT & PLAN: 1. Chronic Systolic Heart Failure - ICM EF 20-25%. Has [employer] ICD.  Recently admitted with cardiogenic shock and at one point was on norepi and milrinone. He was discharged off inotropes.  NYHA III. Today he is volume overloaded. Volume status elevated  in the setting of high salt diet. Continue lasix 40 twice a day and add metolazone for the next 2 days.  Continue Toprol XL, spiro, and dig. No ace for now.  He is not a candidate for advanced therapies. 2. Chronic A fib -  Rate controlled. Continue Toprol XL + Xarelto 3. DMII- Per PCP  4. H/O GI Bleed - duodenal ulcer with clip 9/5 5. Homelessness  He is at risk for readmit due to social situation. Follow up next week to reassess volume status.  Refer to HF SW.  CLEGG,AMY NP-C  1:45 PM

## 2014-12-20 NOTE — Progress Notes (Signed)
CSW referred to assist patient with resources as he is currently homeless. Patient reports he is in appeal process for his disability which he states was terminated because family [place] money from [place]. Patient previously received assistance from [employer] for hotel . CSW discussed shelter options although patient states he is aware although not comfortable with that option. CSW provided other options for housing assistance [facility] and [facility]. Patient grateful for the resources and will follow up. CSW available if further needs arise. Lasandra Beech, LCSW [phone]

## 2014-12-20 NOTE — Progress Notes (Signed)
Advanced Heart Failure Medication Review by a Pharmacist  Does the patient  feel that his/her medications are working for him/her?  yes  Has the patient been experiencing any side effects to the medications prescribed?  no  Does the patient measure his/her own blood pressure or blood glucose at home?  no   Does the patient have any problems obtaining medications due to transportation or finances?   no  Understanding of regimen: fair Understanding of indications: fair Potential of compliance: good    Pharmacist comments:  [name] is a pleasant 59 yo M presenting without a medication list. He is not able to verbalize any dosages to me without prompting but recognizes most of his medication names. He states that he has an appointment later today with his PCP in which he will bring in his medications and they will all be reviewed with him. He did not have any specific medication-related questions or concerns for me today.  [name]. Bonnye Fava, PharmD, BCPS, CPP Clinical Pharmacist Pager: [phone] Phone: [phone] 12/20/2014 11:14 AM

## 2014-12-22 ENCOUNTER — Encounter: Payer: Self-pay | Admitting: Family Medicine

## 2014-12-22 ENCOUNTER — Telehealth: Payer: Self-pay

## 2014-12-22 ENCOUNTER — Ambulatory Visit: Payer: Medicaid Other | Attending: Family Medicine | Admitting: Family Medicine

## 2014-12-22 VITALS — BP 142/84 | HR 99 | Temp 97.8°F | Ht 64.0 in | Wt 222.4 lb

## 2014-12-22 DIAGNOSIS — Z0000 Encounter for general adult medical examination without abnormal findings: Secondary | ICD-10-CM

## 2014-12-22 DIAGNOSIS — F17200 Nicotine dependence, unspecified, uncomplicated: Secondary | ICD-10-CM | POA: Diagnosis not present

## 2014-12-22 DIAGNOSIS — E1159 Type 2 diabetes mellitus with other circulatory complications: Secondary | ICD-10-CM

## 2014-12-22 DIAGNOSIS — R760 Raised antibody titer: Secondary | ICD-10-CM | POA: Diagnosis not present

## 2014-12-22 DIAGNOSIS — I10 Essential (primary) hypertension: Secondary | ICD-10-CM | POA: Insufficient documentation

## 2014-12-22 DIAGNOSIS — I5041 Acute combined systolic (congestive) and diastolic (congestive) heart failure: Secondary | ICD-10-CM | POA: Diagnosis not present

## 2014-12-22 DIAGNOSIS — R768 Other specified abnormal immunological findings in serum: Secondary | ICD-10-CM

## 2014-12-22 DIAGNOSIS — D509 Iron deficiency anemia, unspecified: Secondary | ICD-10-CM | POA: Diagnosis not present

## 2014-12-22 DIAGNOSIS — I739 Peripheral vascular disease, unspecified: Secondary | ICD-10-CM | POA: Insufficient documentation

## 2014-12-22 DIAGNOSIS — R0600 Dyspnea, unspecified: Secondary | ICD-10-CM | POA: Insufficient documentation

## 2014-12-22 LAB — GLUCOSE, POCT (MANUAL RESULT ENTRY): POC Glucose: 172 mg/dl — AB (ref 70–99)

## 2014-12-22 LAB — POCT GLYCOSYLATED HEMOGLOBIN (HGB A1C): HEMOGLOBIN A1C: 6.9

## 2014-12-22 MED ORDER — SENNOSIDES-DOCUSATE SODIUM 8.6-50 MG PO TABS: 1.0000 | ORAL_TABLET | Freq: Every day | ORAL | Status: DC

## 2014-12-22 MED ORDER — DIGOXIN 62.5 MCG PO TABS: 0.0625 mg | ORAL_TABLET | Freq: Every day | ORAL | Status: DC

## 2014-12-22 MED ORDER — PANTOPRAZOLE SODIUM 40 MG PO TBEC: 40.0000 mg | DELAYED_RELEASE_TABLET | Freq: Two times a day (BID) | ORAL | Status: DC

## 2014-12-22 MED ORDER — FUROSEMIDE 40 MG PO TABS: 40.0000 mg | ORAL_TABLET | Freq: Two times a day (BID) | ORAL | Status: DC

## 2014-12-22 MED ORDER — FERROUS SULFATE 325 (65 FE) MG PO TABS: 325.0000 mg | ORAL_TABLET | Freq: Three times a day (TID) | ORAL | Status: DC

## 2014-12-22 MED ORDER — METOPROLOL SUCCINATE ER 25 MG PO TB24: 25.0000 mg | ORAL_TABLET | Freq: Two times a day (BID) | ORAL | Status: DC

## 2014-12-22 MED ORDER — ALBUTEROL SULFATE HFA 108 (90 BASE) MCG/ACT IN AERS: 2.0000 | INHALATION_SPRAY | Freq: Four times a day (QID) | RESPIRATORY_TRACT | Status: DC | PRN

## 2014-12-22 MED ORDER — TRAMADOL HCL 50 MG PO TABS: 50.0000 mg | ORAL_TABLET | Freq: Three times a day (TID) | ORAL | Status: DC | PRN

## 2014-12-22 NOTE — Telephone Encounter (Signed)
Patient was seen today by Dr. Venetia Night.  Dr. Venetia Night refilled numerous medications for patient. This Case Manager placed call to Lucio Edward, Case Manager with [facility], to see if she could assist with adding medication refills to patient's pill box as patient has difficulty knowing what medications to take and when. Unable to reach [facility]; voicemail left requesting return call. Awaiting return call.

## 2014-12-22 NOTE — Progress Notes (Signed)
TRANSITIONAL CARE CLINIC  Date of Telephone Encounter: 12/07/14   PCP: Dr Hyman Hopes  Date of first face-to-face visit: 12/12/14  Subjective:    Patient ID: Jacob [name], male    DOB: Oct 17, 1955, 59 y.o.   MRN: [id number]  HPI  Jacob [name] is a 59 year old male with a history of CHF (EF 30-35% s/p AICD), Atrial fibrillation, Type 2 DM, HTN, Peripheral Vascular Disease recently hospitalized at [facility] from 11/23/14 - 12/05/14 for cardiogenic shock requiring pressors. His hospital course was complicated by GI bleed secondary to large bleeding duodenal ulcer with positive H. pylori antibody for which he was placed on antibiotics.  He comes in to the  transitional care clinic for follow-up visit and saw his cardiologist 2 days ago with the addition of metolazone to his medication regimen which he is yet to pick up. He stopped taking the antibiotics he was placed on for H. pylori as he wasn't sure of the indication. Today he complains of fatigue, shortness of breath and claudication pain more in his right leg which is worse with walking. He shortness of breath is at baseline; he is scheduled to see the vascular doctors next month  Requests refills on a couple of medication and recently had a visit from [facility] where his medications were placed in a pillbox for easier administration.  Past Medical History  Diagnosis Date  . Hypertension   . Noncompliance     homelessness contributing.   Marland Kitchen CAD (coronary artery disease) Sept 2013    s/p cardiac cath showing occlusion of small RCA with collaterals  . Chronic anticoagulation     on xarelto.   . Atrial fibrillation     RVR 10/2014  . Peripheral arterial disease   . Automatic implantable cardioverter-defibrillator in situ   . High cholesterol   . Myocardial infarction 2014  . Type II diabetes mellitus   . CHF (congestive heart failure)     20 to 25 % EF and RV dysfunction by 07/2014 echo     Past Surgical History  Procedure Laterality Date  .  Implantable cardioverter defibrillator implant      [place] in 07/2012; [employer]  . Coronary artery bypass graft  09/2012    2 vessels per patient [name]   . Coronary angioplasty with stent placement  11/2011    "1"  . Cardiac catheterization  09/2012  . Iliac artery stent Right 08/30/2013  . Left and right heart catheterization with coronary angiogram N/A 12/02/2011    Procedure: LEFT AND RIGHT HEART CATHETERIZATION WITH CORONARY ANGIOGRAM;  Surgeon: Kathleene Hazel, MD;  Location: [facility] CATH LAB;  Service: Cardiovascular;  Laterality: N/A;  . Lower extremity angiogram N/A 08/30/2013    Procedure: LOWER EXTREMITY ANGIOGRAM;  Surgeon: Runell Gess, MD;  Location: [facility] CATH LAB;  Service: Cardiovascular;  Laterality: N/A;  . Lower extremity angiogram N/A 12/02/2013    Procedure: LOWER EXTREMITY ANGIOGRAM;  Surgeon: Runell Gess, MD;  Location: [facility] CATH LAB;  Service: Cardiovascular;  Laterality: N/A;  . Esophagogastroduodenoscopy N/A 11/28/2014    Procedure: ESOPHAGOGASTRODUODENOSCOPY (EGD);  Surgeon: Beverley Fiedler, MD;  Location: [facility] ENDOSCOPY;  Service: Endoscopy;  Laterality: N/A;    Social History   Social History  . Marital Status: Divorced    Spouse Name: N/A  . Number of Children: 3  . Years of Education: N/A   Occupational History  . Unemployed    Social History Main Topics  . Smoking status: Current Every Day Smoker --  0.50 packs/day for 38 years    Types: Cigarettes  . Smokeless tobacco: Never Used  . Alcohol Use: No  . Drug Use: No  . Sexual Activity: Yes   Other Topics Concern  . Not on file   Social History Narrative   Has an apartment with a roommate. He was living on the streets in 2013/02/07.  He reports that his father died in [place] in Feb 07, 2013.  He is divorced.  He is no longer estranged from his son, but still from his daughter who lives locally.  Neither of his parents, nor any siblings have any history of CAD.    Allergies  Allergen  Reactions  . Tape Itching    Paper tape please.    Current Outpatient Prescriptions on File Prior to Visit  Medication Sig Dispense Refill  . albuterol (PROVENTIL) (2.5 MG/3ML) 0.083% nebulizer solution Take 3 mLs (2.5 mg total) by nebulization every 6 (six) hours as needed for wheezing or shortness of breath. 150 mL 1  . atorvastatin (LIPITOR) 40 MG tablet Take 1 tablet (40 mg total) by mouth daily at 6 PM. 30 tablet 5  . docusate sodium (COLACE) 100 MG capsule Take 1 capsule (100 mg total) by mouth daily. 30 capsule 5  . rivaroxaban (XARELTO) 20 MG TABS tablet Take 1 tablet (20 mg total) by mouth daily before supper. 30 tablet 6  . spironolactone (ALDACTONE) 25 MG tablet Take 25 mg by mouth daily.    Marland Kitchen amoxicillin (AMOXIL) 500 MG capsule Take 2 capsules (1,000 mg total) by mouth 2 (two) times daily. (Patient not taking: Reported on 12/22/2014) 26 capsule 0  . doxycycline (VIBRA-TABS) 100 MG tablet Take 1 tablet (100 mg total) by mouth 2 (two) times daily. (Patient not taking: Reported on 12/22/2014) 14 tablet 0  . glipiZIDE (GLUCOTROL) 10 MG tablet Take 1 tablet (10 mg total) by mouth 2 (two) times daily before a meal. (Patient not taking: Reported on 12/22/2014) 60 tablet 2  . metolazone (ZAROXOLYN) 5 MG tablet Take 1 tablet (5 mg total) by mouth daily. (Patient not taking: Reported on 12/22/2014) 2 tablet 0   No current facility-administered medications on file prior to visit.      Review of Systems  Constitutional: Negative for activity change and appetite change, + fatigue HENT: Negative for sinus pressure and sore throat.   Eyes: Negative for visual disturbance.  Respiratory: Negative for cough, chest tightness and +shortness of breath.   Cardiovascular: Negative for chest pain and leg swelling.       Claudication pains  Gastrointestinal: Negative for abdominal pain, diarrhea, constipation and abdominal distention.  Endocrine: Negative.   Genitourinary: Negative for dysuria.    Musculoskeletal: Negative for myalgias and joint swelling.  Skin: Negative for rash.  Allergic/Immunologic: Negative.   Neurological: Negative for weakness, light-headedness and numbness.  Psychiatric/Behavioral: Negative for anxiety. Depression, suicidal ideation  Objective: Filed Vitals:   12/22/14 1232  BP: 142/84  Pulse: 99  Temp: 97.8 F (36.6 C)  Height: 5\' 4"  (1.626 m)  Weight: 222 lb 6.4 oz (100.88 kg)  SpO2: 98%      Physical Exam Constitutional: He is oriented to person, place, and time. He appears well-developed and well-nourished.  HENT:  Head: Normocephalic and atraumatic.  Right Ear: External ear normal.  Left Ear: External ear normal.  Eyes: Conjunctivae and EOM are normal. Pupils are equal, round, and reactive to light.  Neck: Normal range of motion. Neck supple. No tracheal deviation present.  Cardiovascular: Normal rate and normal heart sounds.  An irregularly irregular rhythm present.  No murmur heard. Pulmonary/Chest: Effort normal and breath sounds normal. No respiratory distress. He has no wheezes. He exhibits no tenderness.  Abdominal: Soft. Bowel sounds are normal. He exhibits no mass. There is no tenderness.  Musculoskeletal: Normal range of motion. He exhibits no edema or tenderness.  Neurological: He is alert and oriented to person, place, and time.  Skin: Skin is warm and dry.       Assessment & Plan:   1. Type 2 diabetes mellitus with other circulatory complication Controlled with A1c of 6.9, down form 8.2 Refill glipizide. - Glucose (CBG)   2. Essential hypertension BP is slightly elevated above goal of less than 140/90. No regimen changes at this time Continue medication  3. Chronic atrial fibrillation Remains on anticoagulation with Xarelto; rate control with Metoprolol - Pro b was 480.7 two days ago   4. PVD (peripheral vascular disease) Due to persisting claudication pain he is on tramadol. He is scheduled to see [facility] [place]   5. Gastrointestinal hemorrhage, unspecified gastritis, unspecified gastrointestinal hemorrhage type Due to current fatigue I am sending off a CBC - CBC with Differential  6. Duodenal ulcer Asymtomatic  7. Helicobacter pylori ab+ He stopped his antibiotic for no known reason and I have advised him to resume them Continue antibiotics- Amoxicillin, Doxycycline and Protonix and Metronidazole   8. Acute exacerbation of CHF (congestive heart failure) EF 30-35%, s/p AICD No evidence of fluid overload. Continue Lasix Advised to pickup metolazone prescription from the pharmacy. Daily weights, limit fluids to 2L/day, low sodium, DASH, heart healthy diet  This note has been created with Education officer, environmental. Any transcriptional errors are unintentional.

## 2014-12-22 NOTE — Patient Instructions (Signed)

## 2014-12-22 NOTE — Progress Notes (Signed)
Patient reports SOB,dizziness, and feeling tired/lethargic today He met with his [facility] RN yesterday who filled his pill box-she had questions about his advair (note is in room for MD to read) He needs refills on Lasix,Tramadol,Metoprolol, Senna,Digoxin Patient has 3 bottles of different abx in room with him none of which he finished He does not have his glipizide,metolazone,ferrous sulfate  Patient will get flu shot today

## 2014-12-23 ENCOUNTER — Other Ambulatory Visit: Payer: Self-pay | Admitting: Family Medicine

## 2014-12-23 ENCOUNTER — Telehealth: Payer: Self-pay

## 2014-12-23 DIAGNOSIS — K264 Chronic or unspecified duodenal ulcer with hemorrhage: Secondary | ICD-10-CM

## 2014-12-23 LAB — CBC WITH DIFFERENTIAL/PLATELET
BASOS ABS: 0.1 10*3/uL (ref 0.0–0.1)
Basophils Relative: 1 % (ref 0–1)
EOS PCT: 2 % (ref 0–5)
Eosinophils Absolute: 0.2 10*3/uL (ref 0.0–0.7)
HEMATOCRIT: 25.3 % — AB (ref 39.0–52.0)
Hemoglobin: 7.6 g/dL — ABNORMAL LOW (ref 13.0–17.0)
LYMPHS ABS: 1.2 10*3/uL (ref 0.7–4.0)
LYMPHS PCT: 16 % (ref 12–46)
MCH: 20.8 pg — AB (ref 26.0–34.0)
MCHC: 30 g/dL (ref 30.0–36.0)
MCV: 69.1 fL — AB (ref 78.0–100.0)
MONO ABS: 0.7 10*3/uL (ref 0.1–1.0)
MONOS PCT: 9 % (ref 3–12)
Neutro Abs: 5.5 10*3/uL (ref 1.7–7.7)
Neutrophils Relative %: 72 % (ref 43–77)
Platelets: 201 10*3/uL (ref 150–400)
RBC: 3.66 MIL/uL — ABNORMAL LOW (ref 4.22–5.81)
RDW: 25.3 % — AB (ref 11.5–15.5)
WBC: 7.6 10*3/uL (ref 4.0–10.5)

## 2014-12-23 NOTE — Telephone Encounter (Signed)
This Case Manager case conferenced with Dr. Venetia Night who indicated patient's hemoglobin was 7.6 yesterday (12/22/14). Dr. Venetia Night wants patient to come in early next week for a repeat CBC because if his hemoglobin keeps trending down patient will need a blood transfusion.  Dr. Venetia Night also referring patient to GI.  Spoke with patient about need for repeat CBC early next week. Patient verbalized understanding and a lab appointment scheduled for patient on 12/27/14 at 1400.  Also informed patient that Dr. Venetia Night wants to see him again in two weeks.  Follow-up appointment scheduled for 01/09/15 at 1400 with Dr. Venetia Night. Patient verbalized understanding. No additional needs identified at this time.

## 2014-12-23 NOTE — Telephone Encounter (Signed)
Addendum-Received voicemail from Lucio Edward, RN CM with [facility] who indicated she would meet with patient today 12/23/14 and add medication refills to patient's pill box.

## 2014-12-26 ENCOUNTER — Telehealth: Payer: Self-pay

## 2014-12-26 ENCOUNTER — Telehealth: Payer: Self-pay | Admitting: Internal Medicine

## 2014-12-26 NOTE — Telephone Encounter (Signed)
Patient presents to front office after picking up medications from our clinic. Patient is requesting clarification to medications. Patient is needing to know if any of his medications has discontinued.  Please assist

## 2014-12-26 NOTE — Telephone Encounter (Signed)
Patient came in requesting a medication refill, The medication is to help him use the bathroom.

## 2014-12-26 NOTE — Telephone Encounter (Signed)
Call placed to the patient to check on his status.  He said that he is " feeling" his heart when it beats and he has been experiencing this since yesterday.  He said that he just isn't " feeling good" but could not be more specific other than noting that he needs to get a place to live and stop living in his car. He stated that he is not experiencing any bleeding. He said that he does not need or want to go to the ED.  He noted that he does not think that he has all of his medications. He said that he thinks he may be missing 2 pills but he is not sure which ones. He reported that he is meeting with Carollee Herter from [facility] tomorrow at 1200 and she told him that she would go through all of his  his medications and make sure that he has all of the medications that he is supposed to have and set up his med box.  An update of the patient's current complaints was shared with Dr Venetia Night. She stressed the importance of him having his blood work done tomorrow as he may need a transfusion.    This CM reminded the patient of the importance of having his blood drawn tomorrow and he said that he would be at the [facility] at 1400. This CM instructed the patient to seek immediate medical attention/ go to ED if he continues to experience the "strong heart beat " or becomes more short of breath or develops chest pain. He stated that he understood but denied the need to go to the ED at this time.    He then stated that he has not heard from Ms Bradly Bienenstock with [facility] [employer] about the status of his housing and he requested that this CM call her to check on the housing.  This CM placed a call to Ms Bradly Bienenstock # [phone] and left a voice mail message requesting a call back to # [phone].  This CM

## 2014-12-27 ENCOUNTER — Telehealth: Payer: Self-pay

## 2014-12-27 ENCOUNTER — Ambulatory Visit: Payer: Medicaid Other | Attending: Internal Medicine

## 2014-12-27 DIAGNOSIS — K264 Chronic or unspecified duodenal ulcer with hemorrhage: Secondary | ICD-10-CM

## 2014-12-27 NOTE — Telephone Encounter (Signed)
Call placed to the patient to remind him of the lab work that needs to be done today. He confirmed that he will be at the [facility] at 1400.  He also noted that he changed his appointment with Carollee Herter from [facility] to tomorrow.   This CM informed him that a call was placed to Ms Bradly Bienenstock, re.his housing and this CM has not received a return call.

## 2014-12-27 NOTE — Telephone Encounter (Signed)
Call received from Jacob [name] w/ [facility].  She said that they have no update re.the status of the patient's housing.  She noted that Jacob [name], CM has tried to reach out to Jacob [name] with [employer] and has not heard back from her.

## 2014-12-28 ENCOUNTER — Telehealth: Payer: Self-pay | Admitting: Family Medicine

## 2014-12-28 LAB — CBC WITH DIFFERENTIAL/PLATELET
BASOS ABS: 0.1 10*3/uL (ref 0.0–0.1)
Basophils Relative: 1 % (ref 0–1)
Eosinophils Absolute: 0.1 10*3/uL (ref 0.0–0.7)
Eosinophils Relative: 1 % (ref 0–5)
HEMATOCRIT: 27 % — AB (ref 39.0–52.0)
HEMOGLOBIN: 7.6 g/dL — AB (ref 13.0–17.0)
LYMPHS ABS: 1.6 10*3/uL (ref 0.7–4.0)
LYMPHS PCT: 13 % (ref 12–46)
MCH: 19.6 pg — ABNORMAL LOW (ref 26.0–34.0)
MCHC: 28.1 g/dL — ABNORMAL LOW (ref 30.0–36.0)
MCV: 69.6 fL — AB (ref 78.0–100.0)
MONOS PCT: 8 % (ref 3–12)
Monocytes Absolute: 1 10*3/uL (ref 0.1–1.0)
NEUTROS PCT: 77 % (ref 43–77)
Neutro Abs: 9.3 10*3/uL — ABNORMAL HIGH (ref 1.7–7.7)
PLATELETS: 331 10*3/uL (ref 150–400)
RBC: 3.88 MIL/uL — ABNORMAL LOW (ref 4.22–5.81)
RDW: 25.4 % — ABNORMAL HIGH (ref 11.5–15.5)
WBC: 12.1 10*3/uL — ABNORMAL HIGH (ref 4.0–10.5)

## 2014-12-28 NOTE — Telephone Encounter (Signed)
-----   Message from Jaclyn Shaggy, MD sent at 12/28/2014  4:07 PM EDT ----- Please inform him he is still anemic at the very slight improvement from his last visit, keep appointment with GI.

## 2014-12-28 NOTE — Telephone Encounter (Signed)
Called patient. Patient verified name and date of birth. Patient notified that he is still anemic at the very slight improvement from his last visit. Patient advised to keep his appointment with GI. Patient voiced understanding.

## 2014-12-30 ENCOUNTER — Ambulatory Visit: Payer: Medicaid Other | Admitting: Physician Assistant

## 2015-01-03 ENCOUNTER — Encounter: Payer: Self-pay | Admitting: Internal Medicine

## 2015-01-04 ENCOUNTER — Telehealth: Payer: Self-pay

## 2015-01-04 NOTE — Telephone Encounter (Signed)
This Case Manager placed call to patient to check on status. Patient denied any problems. He indicated he was "breathing good." He also indicated that he did not have any swelling in his upper or lower extremities. Patient indicated he was being compliant with all of his medications and that his pill box was set up. After chart review, this Case Manager noticed patient missed his GI appointment on 12/30/14. Inquired about patient missing appointment, and he indicated "he forgot." Informed patient it is important he see a GI doctor due to bleeding duodenal ulcer. Patient verbalized understanding and indicated he would like this Case Manager to reschedule his appointment.  Also reminded patient of his upcoming appointment on 01/09/15 at 1400 with Dr. Venetia Night. Patient verbalized understanding.  Call placed to [facility]. Spoke with Neysa Bonito who indicated there were no available appointments in October, November, or December. She indicated patient was previously worked in to get appointment on 12/30/14. Communication sent to Cheryll Dessert, Referral Coordinator, to determine if patient could be scheduled with another GI office. Awaiting answer from Cheryll Dessert.  Dr. Venetia Night updated.

## 2015-01-09 ENCOUNTER — Encounter: Payer: Self-pay | Admitting: Internal Medicine

## 2015-01-09 ENCOUNTER — Telehealth: Payer: Self-pay

## 2015-01-09 ENCOUNTER — Encounter: Payer: Self-pay | Admitting: Family Medicine

## 2015-01-09 ENCOUNTER — Ambulatory Visit: Payer: Medicaid Other | Attending: Family Medicine | Admitting: Family Medicine

## 2015-01-09 VITALS — Temp 98.5°F | Ht 64.0 in | Wt 232.0 lb

## 2015-01-09 DIAGNOSIS — Z951 Presence of aortocoronary bypass graft: Secondary | ICD-10-CM | POA: Diagnosis not present

## 2015-01-09 DIAGNOSIS — I482 Chronic atrial fibrillation: Secondary | ICD-10-CM | POA: Insufficient documentation

## 2015-01-09 DIAGNOSIS — E1151 Type 2 diabetes mellitus with diabetic peripheral angiopathy without gangrene: Secondary | ICD-10-CM | POA: Diagnosis not present

## 2015-01-09 DIAGNOSIS — I739 Peripheral vascular disease, unspecified: Secondary | ICD-10-CM | POA: Diagnosis not present

## 2015-01-09 DIAGNOSIS — E7800 Pure hypercholesterolemia, unspecified: Secondary | ICD-10-CM | POA: Insufficient documentation

## 2015-01-09 DIAGNOSIS — K269 Duodenal ulcer, unspecified as acute or chronic, without hemorrhage or perforation: Secondary | ICD-10-CM | POA: Insufficient documentation

## 2015-01-09 DIAGNOSIS — Z95810 Presence of automatic (implantable) cardiac defibrillator: Secondary | ICD-10-CM | POA: Diagnosis not present

## 2015-01-09 DIAGNOSIS — D500 Iron deficiency anemia secondary to blood loss (chronic): Secondary | ICD-10-CM | POA: Insufficient documentation

## 2015-01-09 DIAGNOSIS — Z7984 Long term (current) use of oral hypoglycemic drugs: Secondary | ICD-10-CM | POA: Diagnosis not present

## 2015-01-09 DIAGNOSIS — I2510 Atherosclerotic heart disease of native coronary artery without angina pectoris: Secondary | ICD-10-CM | POA: Insufficient documentation

## 2015-01-09 DIAGNOSIS — Z955 Presence of coronary angioplasty implant and graft: Secondary | ICD-10-CM | POA: Diagnosis not present

## 2015-01-09 DIAGNOSIS — F17200 Nicotine dependence, unspecified, uncomplicated: Secondary | ICD-10-CM

## 2015-01-09 DIAGNOSIS — Z79899 Other long term (current) drug therapy: Secondary | ICD-10-CM | POA: Insufficient documentation

## 2015-01-09 DIAGNOSIS — E1159 Type 2 diabetes mellitus with other circulatory complications: Secondary | ICD-10-CM | POA: Insufficient documentation

## 2015-01-09 DIAGNOSIS — I5042 Chronic combined systolic (congestive) and diastolic (congestive) heart failure: Secondary | ICD-10-CM

## 2015-01-09 DIAGNOSIS — I110 Hypertensive heart disease with heart failure: Secondary | ICD-10-CM | POA: Insufficient documentation

## 2015-01-09 DIAGNOSIS — F17210 Nicotine dependence, cigarettes, uncomplicated: Secondary | ICD-10-CM | POA: Diagnosis not present

## 2015-01-09 DIAGNOSIS — Z7902 Long term (current) use of antithrombotics/antiplatelets: Secondary | ICD-10-CM | POA: Diagnosis not present

## 2015-01-09 DIAGNOSIS — I252 Old myocardial infarction: Secondary | ICD-10-CM | POA: Insufficient documentation

## 2015-01-09 DIAGNOSIS — D509 Iron deficiency anemia, unspecified: Secondary | ICD-10-CM

## 2015-01-09 DIAGNOSIS — I509 Heart failure, unspecified: Secondary | ICD-10-CM | POA: Diagnosis not present

## 2015-01-09 DIAGNOSIS — K264 Chronic or unspecified duodenal ulcer with hemorrhage: Secondary | ICD-10-CM

## 2015-01-09 DIAGNOSIS — I10 Essential (primary) hypertension: Secondary | ICD-10-CM | POA: Diagnosis not present

## 2015-01-09 LAB — GLUCOSE, POCT (MANUAL RESULT ENTRY): POC GLUCOSE: 329 mg/dL — AB (ref 70–99)

## 2015-01-09 NOTE — Progress Notes (Signed)
Subjective:    Patient ID: Jacob [name], male    DOB: November 22, 1955, 59 y.o.   MRN: [id number]  HPI Jacob [name] is a 59 year old male with a history of CHF (EF 30-35% s/p AICD), Atrial fibrillation, Type 2 DM, HTN, Peripheral Vascular Disease, acute on chronic anemia recently hospitalized for large bleeding duodenal ulcer which was H. pylori positive and he has completed a course of antibiotics. He had been referred to GI because he had remained persistently anemic and was to be scheduled for a Endoscopy but the Patient Has No Showed His Appointments Several Times and We were Unable to Work Him in with [facility] or GI or [facility] and patient attributes this to forgetting that he has appointments.  Today he informs me he still feels weak but denies the presence of dark tarry stools. He also complains of pedal edema and some shortness of breath and endorses compliance with medications. Sometimes feel dizzy when he walks. He meets with [facility] once a week and has medication reconciliation done.  Continues to live in his car and is refusing to stay with his friend and attempts at securing housing for him have failed so far and he sleeps in his car and eats from fast food restaurants.  Past Medical History  Diagnosis Date  . Hypertension   . Noncompliance     homelessness contributing.   Marland Kitchen CAD (coronary artery disease) Sept 2013    s/p cardiac cath showing occlusion of small RCA with collaterals  . Chronic anticoagulation     on xarelto.   . Atrial fibrillation (HCC)     RVR 10/2014  . Peripheral arterial disease (HCC)   . Automatic implantable cardioverter-defibrillator in situ   . High cholesterol   . Myocardial infarction (HCC) 2014  . Type II diabetes mellitus (HCC)   . CHF (congestive heart failure) (HCC)     20 to 25 % EF and RV dysfunction by 07/2014 echo     Past Surgical History  Procedure Laterality Date  . Implantable cardioverter defibrillator implant      [place] in 07/2012; [employer]  . Coronary artery bypass graft  09/2012    2 vessels per patient [name]   . Coronary angioplasty with stent placement  11/2011    "1"  . Cardiac catheterization  09/2012  . Iliac artery stent Right 08/30/2013  . Left and right heart catheterization with coronary angiogram N/A 12/02/2011    Procedure: LEFT AND RIGHT HEART CATHETERIZATION WITH CORONARY ANGIOGRAM;  Surgeon: Kathleene Hazel, MD;  Location: [facility] CATH LAB;  Service: Cardiovascular;  Laterality: N/A;  . Lower extremity angiogram N/A 08/30/2013    Procedure: LOWER EXTREMITY ANGIOGRAM;  Surgeon: Runell Gess, MD;  Location: [facility] CATH LAB;  Service: Cardiovascular;  Laterality: N/A;  . Lower extremity angiogram N/A 12/02/2013    Procedure: LOWER EXTREMITY ANGIOGRAM;  Surgeon: Runell Gess, MD;  Location: [facility] CATH LAB;  Service: Cardiovascular;  Laterality: N/A;  . Esophagogastroduodenoscopy N/A 11/28/2014    Procedure: ESOPHAGOGASTRODUODENOSCOPY (EGD);  Surgeon: Beverley Fiedler, MD;  Location: [facility] ENDOSCOPY;  Service: Endoscopy;  Laterality: N/A;    Social History   Social History  . Marital Status: Divorced    Spouse Name: N/A  . Number of Children: 3  . Years of Education: N/A   Occupational History  . Unemployed    Social History Main Topics  . Smoking status: Current Every Day Smoker -- 0.50 packs/day for 38 years  Types: Cigarettes  . Smokeless tobacco: Never Used  . Alcohol Use: No  . Drug Use: No  . Sexual Activity: Yes   Other Topics Concern  . Not on file   Social History Narrative   Has an apartment with a roommate. He was living on the streets in 2013/02/12.  He reports that his father died in [place] in 02-12-13.  He is divorced.  He is no longer estranged from his son, but still from his daughter who lives locally.  Neither of his parents, nor any siblings have any history of CAD.    Allergies  Allergen Reactions  . Tape Itching    Paper tape please.    Current Outpatient  Prescriptions on File Prior to Visit  Medication Sig Dispense Refill  . albuterol (PROVENTIL HFA;VENTOLIN HFA) 108 (90 BASE) MCG/ACT inhaler Inhale 2 puffs into the lungs every 6 (six) hours as needed for wheezing or shortness of breath. 1 Inhaler 2  . albuterol (PROVENTIL) (2.5 MG/3ML) 0.083% nebulizer solution Take 3 mLs (2.5 mg total) by nebulization every 6 (six) hours as needed for wheezing or shortness of breath. 150 mL 1  . amoxicillin (AMOXIL) 500 MG capsule Take 2 capsules (1,000 mg total) by mouth 2 (two) times daily. 26 capsule 0  . atorvastatin (LIPITOR) 40 MG tablet Take 1 tablet (40 mg total) by mouth daily at 6 PM. 30 tablet 5  . Digoxin 62.5 MCG TABS Take 0.0625 mg by mouth daily. 30 tablet 1  . docusate sodium (COLACE) 100 MG capsule Take 1 capsule (100 mg total) by mouth daily. 30 capsule 5  . doxycycline (VIBRA-TABS) 100 MG tablet Take 1 tablet (100 mg total) by mouth 2 (two) times daily. 14 tablet 0  . ferrous sulfate 325 (65 FE) MG tablet Take 1 tablet (325 mg total) by mouth 3 (three) times daily with meals. 90 tablet 2  . furosemide (LASIX) 40 MG tablet Take 1 tablet (40 mg total) by mouth 2 (two) times daily. 60 tablet 2  . glipiZIDE (GLUCOTROL) 10 MG tablet Take 1 tablet (10 mg total) by mouth 2 (two) times daily before a meal. 60 tablet 2  . metoprolol succinate (TOPROL-XL) 25 MG 24 hr tablet Take 1 tablet (25 mg total) by mouth 2 (two) times daily. 60 tablet 2  . pantoprazole (PROTONIX) 40 MG tablet Take 1 tablet (40 mg total) by mouth 2 (two) times daily. 60 tablet 2  . rivaroxaban (XARELTO) 20 MG TABS tablet Take 1 tablet (20 mg total) by mouth daily before supper. 30 tablet 6  . senna-docusate (SENOKOT-S) 8.6-50 MG tablet Take 1 tablet by mouth at bedtime. 30 tablet 2  . spironolactone (ALDACTONE) 25 MG tablet Take 25 mg by mouth daily.    . traMADol (ULTRAM) 50 MG tablet Take 1 tablet (50 mg total) by mouth every 8 (eight) hours as needed for moderate pain. 40 tablet 1   . metolazone (ZAROXOLYN) 5 MG tablet Take 1 tablet (5 mg total) by mouth daily. (Patient not taking: Reported on 12/22/2014) 2 tablet 0   No current facility-administered medications on file prior to visit.     Review of Systems Constitutional: Negative for activity change and appetite change, + fatigue HENT: Negative for sinus pressure and sore throat.   Eyes: Negative for visual disturbance.  Respiratory: Negative for cough, chest tightness and +shortness of breath.   Cardiovascular: Negative for chest pain, positive for leg swelling.       Claudication pains  Gastrointestinal: Negative for abdominal pain, diarrhea, constipation and abdominal distention.  Endocrine: Negative.   Genitourinary: Negative for dysuria.  Musculoskeletal: Negative for myalgias and joint swelling.  Skin: Negative for rash.  Allergic/Immunologic: Negative.   Neurological: Negative for weakness, positive for light-headedness and negative for numbness.  Psychiatric/Behavioral: Negative for anxiety. Depression, suicidal ideation     Objective: Filed Vitals:   01/09/15 1412 01/09/15 1420  Temp:  98.5 F (36.9 C)  TempSrc:  Oral  Height:  (1.626 m)   Weight: 232 lb (105.235 kg)       Physical Exam Constitutional: He is oriented to person, place, and time. He appears well-developed and well-nourished.  Eyes: Conjunctivae and EOM are normal. Pupils are equal, round, and reactive to light.  Neck: Normal range of motion. Neck supple. No tracheal deviation present, no JVD.  Cardiovascular: Tachycardic rate and normal heart sounds.  An irregularly irregular rhythm present.  No murmur heard. Pulmonary/Chest: Effort normal and breath sounds normal. No respiratory distress. He has no wheezes. He exhibits no tenderness.  Abdominal: Soft. Bowel sounds are normal. He exhibits no mass. There is no tenderness.  Musculoskeletal: Normal range of motion. He exhibits 1+ nonpitting pedal edema, no tenderness.    Neurological: He is alert and oriented to person, place, and time.  Skin: Skin is warm and dry.    Wt Readings from Last 3 Encounters:  01/09/15 232 lb (105.235 kg)  12/22/14 222 lb 6.4 oz (100.88 kg)  12/20/14 217 lb (98.431 kg)   BNP    Component Value Date/Time   BNP 480.7* 12/20/2014 1158    ProBNP BNP (last 3 results)  Recent Labs  11/11/14 1833 11/23/14 0328 12/20/14 1158  BNP 807.3* 866.8* 480.7*    ProBNP (last 3 results)  Recent Labs  09/20/14 1201 10/04/14 1256 12/12/14 1614  PROBNP 3006.00* 2524.00* 1892.00*           Assessment & Plan:   1. Type 2 diabetes mellitus with other circulatory complication Controlled with A1c of 6.9, down form 8.2 Refill glipizide. - Glucose (CBG)   2. Essential hypertension Controlled No regimen changes at this time Continue medication  3. Chronic atrial fibrillation Remains on anticoagulation with Xarelto; rate control with Metoprolol    4. PVD (peripheral vascular disease) Due to persisting claudication pain he is on tramadol. He is scheduled to see [facility] [place] later this month   5. Anemia Secondary to Gastrointestinal hemorrhage, unspecified gastritis, unspecified gastrointestinal hemorrhage type Referred to GI and he has missed multiple appointments. We will try in the [facility]. To see if they will be a GI practice that is willing to see him given his multiple no shows. - CBC with Differential  6. Duodenal ulcer Asymtomatic Completed course of antibiotics   7. 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 Daily weights, limit fluids to  2L/day, low sodium, DASH, heart healthy diet  This note has been created with Education officer, environmental. Any transcriptional errors are unintentional.

## 2015-01-09 NOTE — Telephone Encounter (Signed)
Call placed to the patient to confirm his appointment for this afternoon at 1400 with Dr Venetia Night.  He said that he would be at the appointment and would bring all of his medications.

## 2015-01-09 NOTE — Patient Instructions (Signed)

## 2015-01-09 NOTE — Progress Notes (Signed)
Pt's here for [facility] f/up. Pt c/o of fluid buildup in legs, and lower back. Rate pain at 4. Pt reports feeling dizzy since last night worse when he walks. Pt states that he's taken all meds this morning. Pt brought meds with him but there in a med case. Pt needs rx of tramadol.

## 2015-01-10 LAB — CBC WITH DIFFERENTIAL/PLATELET
Basophils Absolute: 0.1 10*3/uL (ref 0.0–0.1)
Basophils Relative: 1 % (ref 0–1)
Eosinophils Absolute: 0.1 10*3/uL (ref 0.0–0.7)
Eosinophils Relative: 1 % (ref 0–5)
HEMATOCRIT: 30.3 % — AB (ref 39.0–52.0)
HEMOGLOBIN: 8.2 g/dL — AB (ref 13.0–17.0)
LYMPHS ABS: 1.4 10*3/uL (ref 0.7–4.0)
LYMPHS PCT: 17 % (ref 12–46)
MCH: 19.2 pg — AB (ref 26.0–34.0)
MCHC: 27.1 g/dL — AB (ref 30.0–36.0)
MCV: 71.1 fL — ABNORMAL LOW (ref 78.0–100.0)
MONO ABS: 0.6 10*3/uL (ref 0.1–1.0)
MONOS PCT: 8 % (ref 3–12)
NEUTROS ABS: 5.9 10*3/uL (ref 1.7–7.7)
NEUTROS PCT: 73 % (ref 43–77)
Platelets: 378 10*3/uL (ref 150–400)
RBC: 4.26 MIL/uL (ref 4.22–5.81)
RDW: 26.4 % — ABNORMAL HIGH (ref 11.5–15.5)
WBC: 8.1 10*3/uL (ref 4.0–10.5)

## 2015-01-10 LAB — BRAIN NATRIURETIC PEPTIDE: BRAIN NATRIURETIC PEPTIDE: 587.5 pg/mL — AB (ref 0.0–100.0)

## 2015-01-11 ENCOUNTER — Emergency Department (HOSPITAL_COMMUNITY)
Admission: EM | Admit: 2015-01-11 | Discharge: 2015-01-11 | Disposition: A | Discharge status: Home / Self Care | Payer: Medicaid Other | Attending: Emergency Medicine | Admitting: Emergency Medicine

## 2015-01-11 ENCOUNTER — Emergency Department (HOSPITAL_COMMUNITY): Payer: Medicaid Other

## 2015-01-11 ENCOUNTER — Encounter (HOSPITAL_COMMUNITY): Payer: Self-pay | Admitting: Neurology

## 2015-01-11 DIAGNOSIS — I10 Essential (primary) hypertension: Secondary | ICD-10-CM | POA: Insufficient documentation

## 2015-01-11 DIAGNOSIS — I2510 Atherosclerotic heart disease of native coronary artery without angina pectoris: Secondary | ICD-10-CM | POA: Diagnosis not present

## 2015-01-11 DIAGNOSIS — I252 Old myocardial infarction: Secondary | ICD-10-CM | POA: Diagnosis not present

## 2015-01-11 DIAGNOSIS — Z7901 Long term (current) use of anticoagulants: Secondary | ICD-10-CM | POA: Diagnosis not present

## 2015-01-11 DIAGNOSIS — I509 Heart failure, unspecified: Secondary | ICD-10-CM | POA: Diagnosis not present

## 2015-01-11 DIAGNOSIS — Z9861 Coronary angioplasty status: Secondary | ICD-10-CM | POA: Diagnosis not present

## 2015-01-11 DIAGNOSIS — Z792 Long term (current) use of antibiotics: Secondary | ICD-10-CM | POA: Diagnosis not present

## 2015-01-11 DIAGNOSIS — R600 Localized edema: Secondary | ICD-10-CM | POA: Insufficient documentation

## 2015-01-11 DIAGNOSIS — Z95810 Presence of automatic (implantable) cardiac defibrillator: Secondary | ICD-10-CM | POA: Insufficient documentation

## 2015-01-11 DIAGNOSIS — Z720 Tobacco use: Secondary | ICD-10-CM | POA: Diagnosis not present

## 2015-01-11 DIAGNOSIS — Z951 Presence of aortocoronary bypass graft: Secondary | ICD-10-CM | POA: Insufficient documentation

## 2015-01-11 DIAGNOSIS — R0602 Shortness of breath: Secondary | ICD-10-CM | POA: Diagnosis present

## 2015-01-11 DIAGNOSIS — Z79899 Other long term (current) drug therapy: Secondary | ICD-10-CM | POA: Diagnosis not present

## 2015-01-11 DIAGNOSIS — Z9889 Other specified postprocedural states: Secondary | ICD-10-CM | POA: Diagnosis not present

## 2015-01-11 DIAGNOSIS — E119 Type 2 diabetes mellitus without complications: Secondary | ICD-10-CM | POA: Diagnosis not present

## 2015-01-11 DIAGNOSIS — I499 Cardiac arrhythmia, unspecified: Secondary | ICD-10-CM | POA: Insufficient documentation

## 2015-01-11 DIAGNOSIS — I4891 Unspecified atrial fibrillation: Secondary | ICD-10-CM | POA: Diagnosis not present

## 2015-01-11 LAB — CBC
HEMATOCRIT: 33.8 % — AB (ref 39.0–52.0)
HEMOGLOBIN: 8.9 g/dL — AB (ref 13.0–17.0)
MCH: 19.3 pg — AB (ref 26.0–34.0)
MCHC: 26.3 g/dL — ABNORMAL LOW (ref 30.0–36.0)
MCV: 73.3 fL — AB (ref 78.0–100.0)
PLATELETS: 304 10*3/uL (ref 150–400)
RBC: 4.61 MIL/uL (ref 4.22–5.81)
RDW: 27.2 % — ABNORMAL HIGH (ref 11.5–15.5)
WBC: 7.9 10*3/uL (ref 4.0–10.5)

## 2015-01-11 LAB — BASIC METABOLIC PANEL
Anion gap: 11 (ref 5–15)
BUN: 18 mg/dL (ref 6–20)
CALCIUM: 8.9 mg/dL (ref 8.9–10.3)
CO2: 31 mmol/L (ref 22–32)
CREATININE: 1.09 mg/dL (ref 0.61–1.24)
Chloride: 94 mmol/L — ABNORMAL LOW (ref 101–111)
GFR calc Af Amer: >60 mL/min (ref >60)
GLUCOSE: 171 mg/dL — AB (ref 65–99)
Potassium: 3.9 mmol/L (ref 3.5–5.1)
Sodium: 136 mmol/L (ref 135–145)

## 2015-01-11 LAB — DIGOXIN LEVEL: Digoxin Level: <0.2 ng/mL — ABNORMAL LOW (ref 0.8–2.0)

## 2015-01-11 MED ORDER — FUROSEMIDE 10 MG/ML IJ SOLN
80.0000 mg | Freq: Once | INTRAMUSCULAR | Status: AC
  Administered 2015-01-11: 80 mg via INTRAVENOUS
  Filled 2015-01-11: qty 8

## 2015-01-11 MED ORDER — METOPROLOL TARTRATE 25 MG PO TABS
25.0000 mg | ORAL_TABLET | Freq: Once | ORAL | Status: AC
  Administered 2015-01-11: 25 mg via ORAL
  Filled 2015-01-11: qty 1

## 2015-01-11 NOTE — ED Notes (Signed)
Pt here c/o sob and swelling to BLE for 4 days. Has had wt increase. Denies cp. Has been taking meds.

## 2015-01-11 NOTE — ED Provider Notes (Signed)
CSN: [id number]     Arrival date & time 01/11/15  1550 History   First MD Initiated Contact with Patient 01/11/15 1607     Chief Complaint  Patient presents with  . Shortness of Breath  . Leg Swelling     (Consider location/radiation/quality/duration/timing/severity/associated sxs/prior Treatment) HPI Comments: Patient is a 59 year old male with a history of atrial fibrillation on Xarelto, MI, CHF with an ejection fraction between 20 and 25%, CABG, AICD placement, diabetes who presents today for worsening lower extremity swelling for the last 4 days despite taking all of his medications. He normally takes 40 mg of Lasix twice a day and in the last 2 days has increased it to 80 mg twice a day per his doctor's request. He has been urinating excessively.  Patient denies any chest pain or shortness of breath at this time. Based on his weight he's a proximally 10 pounds up. He does think he may be eating salty foods which could be the cause of the excess fluid. He denies fever, cough, abdominal pain, nausea or vomiting.  The history is provided by the patient.    Past Medical History  Diagnosis Date  . Hypertension   . Noncompliance     homelessness contributing.   Marland Kitchen CAD (coronary artery disease) Sept 2013    s/p cardiac cath showing occlusion of small RCA with collaterals  . Chronic anticoagulation     on xarelto.   . Atrial fibrillation (HCC)     RVR 10/2014  . Peripheral arterial disease (HCC)   . Automatic implantable cardioverter-defibrillator in situ   . High cholesterol   . Myocardial infarction (HCC) 2014  . Type II diabetes mellitus (HCC)   . CHF (congestive heart failure) (HCC)     20 to 25 % EF and RV dysfunction by 07/2014 echo    Past Surgical History  Procedure Laterality Date  . Implantable cardioverter defibrillator implant      [place] in 07/2012; [employer]  . Coronary artery bypass graft  09/2012    2 vessels per patient [name]   . Coronary angioplasty  with stent placement  11/2011    "1"  . Cardiac catheterization  09/2012  . Iliac artery stent Right 08/30/2013  . Left and right heart catheterization with coronary angiogram N/A 12/02/2011    Procedure: LEFT AND RIGHT HEART CATHETERIZATION WITH CORONARY ANGIOGRAM;  Surgeon: Kathleene Hazel, MD;  Location: [facility] CATH LAB;  Service: Cardiovascular;  Laterality: N/A;  . Lower extremity angiogram N/A 08/30/2013    Procedure: LOWER EXTREMITY ANGIOGRAM;  Surgeon: Runell Gess, MD;  Location: [facility] CATH LAB;  Service: Cardiovascular;  Laterality: N/A;  . Lower extremity angiogram N/A 12/02/2013    Procedure: LOWER EXTREMITY ANGIOGRAM;  Surgeon: Runell Gess, MD;  Location: [facility] CATH LAB;  Service: Cardiovascular;  Laterality: N/A;  . Esophagogastroduodenoscopy N/A 11/28/2014    Procedure: ESOPHAGOGASTRODUODENOSCOPY (EGD);  Surgeon: Beverley Fiedler, MD;  Location: [facility] ENDOSCOPY;  Service: Endoscopy;  Laterality: N/A;   Family History  Problem Relation Age of Onset  . Diabetes Mother    Social History  Substance Use Topics  . Smoking status: Current Every Day Smoker -- 0.50 packs/day for 38 years    Types: Cigarettes  . Smokeless tobacco: Never Used  . Alcohol Use: No    Review of Systems  All other systems reviewed and are negative.     Allergies  Tape  Home Medications   Prior to Admission medications   Medication Sig  Start Date End Date Taking? Authorizing Provider  albuterol (PROVENTIL HFA;VENTOLIN HFA) 108 (90 BASE) MCG/ACT inhaler Inhale 2 puffs into the lungs every 6 (six) hours as needed for wheezing or shortness of breath. 12/22/14   Jaclyn Shaggy, MD  albuterol (PROVENTIL) (2.5 MG/3ML) 0.083% nebulizer solution Take 3 mLs (2.5 mg total) by nebulization every 6 (six) hours as needed for wheezing or shortness of breath. 09/06/14   Jaclyn Shaggy, MD  amoxicillin (AMOXIL) 500 MG capsule Take 2 capsules (1,000 mg total) by mouth 2 (two) times daily. 12/05/14   Hyacinth Meeker, MD  atorvastatin  (LIPITOR) 40 MG tablet Take 1 tablet (40 mg total) by mouth daily at 6 PM. 12/05/14   Tasrif Ahmed, MD  Digoxin 62.5 MCG TABS Take 0.0625 mg by mouth daily. 12/22/14   Jaclyn Shaggy, MD  docusate sodium (COLACE) 100 MG capsule Take 1 capsule (100 mg total) by mouth daily. 10/13/14   Courtney Paris, MD  doxycycline (VIBRA-TABS) 100 MG tablet Take 1 tablet (100 mg total) by mouth 2 (two) times daily. 12/06/14   Gust Rung, DO  ferrous sulfate 325 (65 FE) MG tablet Take 1 tablet (325 mg total) by mouth 3 (three) times daily with meals. 12/22/14   Jaclyn Shaggy, MD  furosemide (LASIX) 40 MG tablet Take 1 tablet (40 mg total) by mouth 2 (two) times daily. 12/22/14   Jaclyn Shaggy, MD  glipiZIDE (GLUCOTROL) 10 MG tablet Take 1 tablet (10 mg total) by mouth 2 (two) times daily before a meal. 11/02/14   Jaclyn Shaggy, MD  metolazone (ZAROXOLYN) 5 MG tablet Take 1 tablet (5 mg total) by mouth daily. Patient not taking: Reported on 12/22/2014 12/20/14   Amy D Clegg, NP  metoprolol succinate (TOPROL-XL) 25 MG 24 hr tablet Take 1 tablet (25 mg total) by mouth 2 (two) times daily. 12/22/14   Jaclyn Shaggy, MD  pantoprazole (PROTONIX) 40 MG tablet Take 1 tablet (40 mg total) by mouth 2 (two) times daily. 12/22/14   Jaclyn Shaggy, MD  rivaroxaban (XARELTO) 20 MG TABS tablet Take 1 tablet (20 mg total) by mouth daily before supper. 10/13/14   Courtney Paris, MD  senna-docusate (SENOKOT-S) 8.6-50 MG tablet Take 1 tablet by mouth at bedtime. 12/22/14   Jaclyn Shaggy, MD  spironolactone (ALDACTONE) 25 MG tablet Take 25 mg by mouth daily.    Historical Provider, MD  traMADol (ULTRAM) 50 MG tablet Take 1 tablet (50 mg total) by mouth every 8 (eight) hours as needed for moderate pain. 12/22/14   Jaclyn Shaggy, MD   BP 106/59 mmHg  Pulse 120  Temp(Src) 98.6 F (37 C) (Oral)  Resp 16  Wt 214 lb 2 oz (97.126 kg)  SpO2 95% Physical Exam  Constitutional: He is oriented to person, place, and time. He appears well-developed and  well-nourished. No distress.  HENT:  Head: Normocephalic and atraumatic.  Mouth/Throat: Oropharynx is clear and moist.  Eyes: Conjunctivae and EOM are normal. Pupils are equal, round, and reactive to light.  Neck: Normal range of motion. Neck supple.  Cardiovascular: Intact distal pulses.  An irregularly irregular rhythm present. Tachycardia present.   No murmur heard. Pulmonary/Chest: Effort normal. No respiratory distress. He has no wheezes. He has rales in the right lower field and the left lower field.  Abdominal: Soft. He exhibits no distension. There is no tenderness. There is no rebound and no guarding.  Musculoskeletal: Normal range of motion. He exhibits edema. He exhibits no tenderness.  1+ pitting edema  in bilateral lower extremities to the midshin  Neurological: He is alert and oriented to person, place, and time.  Skin: Skin is warm and dry. No rash noted. No erythema.  Psychiatric: He has a normal mood and affect. His behavior is normal.  Nursing note and vitals reviewed.   ED Course  Procedures (including critical care time) Labs Review Labs Reviewed  BASIC METABOLIC PANEL - Abnormal; Notable for the following:    Chloride 94 (*)    Glucose, Bld 171 (*)    All other components within normal limits  CBC - Abnormal; Notable for the following:    Hemoglobin 8.9 (*)    HCT 33.8 (*)    MCV 73.3 (*)    MCH 19.3 (*)    MCHC 26.3 (*)    RDW 27.2 (*)    All other components within normal limits  DIGOXIN LEVEL - Abnormal; Notable for the following:    Digoxin Level <0.2 (*)    All other components within normal limits    Imaging Review Dg Chest 2 View  01/11/2015  CLINICAL DATA:  History of CHF and atrial fibrillation with bilateral leg swelling EXAM: CHEST - 2 VIEW COMPARISON:  12/01/2014 FINDINGS: Defibrillator is again noted on the left. Postsurgical changes are again seen. The cardiac shadow remains enlarged and stable. Small bilateral pleural effusions are noted  right slightly greater than left. No focal confluent infiltrate is seen. Mild interstitial changes and vascular congestion are seen. IMPRESSION: Changes of mild CHF with small bilateral pleural effusions. Electronically Signed   By: Alcide Clever M.D.   On: 01/11/2015 16:48   I have personally reviewed and evaluated these images and lab results as part of my medical decision-making.   EKG Interpretation   Date/Time:  Wednesday January 11 2015 15:53:25 EDT Ventricular Rate:  112 PR Interval:    QRS Duration: 100 QT Interval:  354 QTC Calculation: 483 R Axis:   -31 Text Interpretation:  Atrial fibrillation with rapid ventricular response  Left axis deviation ST \\T \ T wave abnormality, consider inferior ischemia  No significant change since last tracing Confirmed by Anitra Lauth  MD,  Alphonzo Lemmings ([id number]) on 01/11/2015 4:08:10 PM      MDM   Final diagnoses:  Acute on chronic congestive heart failure, unspecified congestive heart failure type [facility])    Patient is a 59 year old male with an extensive cardiac history including atrial fibrillation, CHF and ejection fraction between 20 and 25% who comes in today due to bilateral swelling in his lower extremities. His typical weight is 202-203 and today he is 214. The last 2 days he has been taking 80 mg of Lasix morning and night with increased urination. He denies any shortness of breath or chest pain. He does admit to eating some salty food and drinks a fair amount of water which is most likely the cause of his fluid retention today. He is well-appearing on exam with 1+ pitting edema in the lower extremities. Also he has not been wearing his compression hose because she states they are too tight.  Patient has a history of anemia with recent blood transfusion approximately one month ago with hemoglobin today at 8.9 which is his baseline. EKG with A. fib without acute changes. Chest x-ray showing mild CHF with small bilateral pleural effusions. BMP pending  to ensure renal function is baseline.  As long as renal function is at his baseline we'll give an IV dose of Lasix and ensure diuresis here however patient will most  likely be able to go home. He has outpatient follow-up and at this time does not appear to require admission  7:27 PM Patient's renal function is at baseline. He has had good urine output and nurse questing to go home. Will have patient continue taking 80 mg of Lasix in the morning and 40 at night and following up with his doctor returning if symptoms worsen  Gwyneth Sprout, MD 01/11/15 1928

## 2015-01-11 NOTE — Discharge Instructions (Signed)
Continue 2 lasix tabs in the morning and 1 at night for the next few days until the swelling resolves then go back to 1 in the morning and 1 at night Heart Failure Heart failure is a condition in which the heart has trouble pumping blood. This means your heart does not pump blood efficiently for your body to work well. In some cases of heart failure, fluid may back up into your lungs or you may have swelling (edema) in your lower legs. Heart failure is usually a long-term (chronic) condition. It is important for you to take good care of yourself and follow your health care provider's treatment plan. CAUSES  Some health conditions can cause heart failure. Those health conditions include:  High blood pressure (hypertension). Hypertension causes the heart muscle to work harder than normal. When pressure in the blood vessels is high, the heart needs to pump (contract) with more force in order to circulate blood throughout the body. High blood pressure eventually causes the heart to become stiff and weak.  Coronary artery disease (CAD). CAD is the buildup of cholesterol and fat (plaque) in the arteries of the heart. The blockage in the arteries deprives the heart muscle of oxygen and blood. This can cause chest pain and may lead to a heart attack. High blood pressure can also contribute to CAD.  Heart attack (myocardial infarction). A heart attack occurs when one or more arteries in the heart become blocked. The loss of oxygen damages the muscle tissue of the heart. When this happens, part of the heart muscle dies. The injured tissue does not contract as well and weakens the heart's ability to pump blood.  Abnormal heart valves. When the heart valves do not open and close properly, it can cause heart failure. This makes the heart muscle pump harder to keep the blood flowing.  Heart muscle disease (cardiomyopathy or myocarditis). Heart muscle disease is damage to the heart muscle from a variety of causes.  These can include drug or alcohol abuse, infections, or unknown reasons. These can increase the risk of heart failure.  Lung disease. Lung disease makes the heart work harder because the lungs do not work properly. This can cause a strain on the heart, leading it to fail.  Diabetes. Diabetes increases the risk of heart failure. High blood sugar contributes to high fat (lipid) levels in the blood. Diabetes can also cause slow damage to tiny blood vessels that carry important nutrients to the heart muscle. When the heart does not get enough oxygen and food, it can cause the heart to become weak and stiff. This leads to a heart that does not contract efficiently.  Other conditions can contribute to heart failure. These include abnormal heart rhythms, thyroid problems, and low blood counts (anemia). Certain unhealthy behaviors can increase the risk of heart failure, including:  Being overweight.  Smoking or chewing tobacco.  Eating foods high in fat and cholesterol.  Abusing illicit drugs or alcohol.  Lacking physical activity. SYMPTOMS  Heart failure symptoms may vary and can be hard to detect. Symptoms may include:  Shortness of breath with activity, such as climbing stairs.  Persistent cough.  Swelling of the feet, ankles, legs, or abdomen.  Unexplained weight gain.  Difficulty breathing when lying flat (orthopnea).  Waking from sleep because of the need to sit up and get more air.  Rapid heartbeat.  Fatigue and loss of energy.  Feeling light-headed, dizzy, or close to fainting.  Loss of appetite.  Nausea.  Increased urination during the night (nocturia). DIAGNOSIS  A diagnosis of heart failure is based on your history, symptoms, physical examination, and diagnostic tests. Diagnostic tests for heart failure may include:  Echocardiography.  Electrocardiography.  Chest X-ray.  Blood tests.  Exercise stress test.  Cardiac angiography.  Radionuclide  scans. TREATMENT  Treatment is aimed at managing the symptoms of heart failure. Medicines, behavioral changes, or surgical intervention may be necessary to treat heart failure.  Medicines to help treat heart failure may include:  Angiotensin-converting enzyme (ACE) inhibitors. This type of medicine blocks the effects of a blood protein called angiotensin-converting enzyme. ACE inhibitors relax (dilate) the blood vessels and help lower blood pressure.  Angiotensin receptor blockers (ARBs). This type of medicine blocks the actions of a blood protein called angiotensin. Angiotensin receptor blockers dilate the blood vessels and help lower blood pressure.  Water pills (diuretics). Diuretics cause the kidneys to remove salt and water from the blood. The extra fluid is removed through urination. This loss of extra fluid lowers the volume of blood the heart pumps.  Beta blockers. These prevent the heart from beating too fast and improve heart muscle strength.  Digitalis. This increases the force of the heartbeat.  Healthy behavior changes include:  Obtaining and maintaining a healthy weight.  Stopping smoking or chewing tobacco.  Eating heart-healthy foods.  Limiting or avoiding alcohol.  Stopping illicit drug use.  Physical activity as directed by your health care provider.  Surgical treatment for heart failure may include:  A procedure to open blocked arteries, repair damaged heart valves, or remove damaged heart muscle tissue.  A pacemaker to improve heart muscle function and control certain abnormal heart rhythms.  An internal cardioverter defibrillator to treat certain serious abnormal heart rhythms.  A left ventricular assist device (LVAD) to assist the pumping ability of the heart. HOME CARE INSTRUCTIONS   Take medicines only as directed by your health care provider. Medicines are important in reducing the workload of your heart, slowing the progression of heart failure, and  improving your symptoms.  Do not stop taking your medicine unless directed by your health care provider.  Do not skip any dose of medicine.  Refill your prescriptions before you run out of medicine. Your medicines are needed every day.  Engage in moderate physical activity if directed by your health care provider. Moderate physical activity can benefit some people. The elderly and people with severe heart failure should consult with a health care provider for physical activity recommendations.  Eat heart-healthy foods. Food choices should be free of trans fat and low in saturated fat, cholesterol, and salt (sodium). Healthy choices include fresh or frozen fruits and vegetables, fish, lean meats, legumes, fat-free or low-fat dairy products, and whole grain or high fiber foods. Talk to a dietitian to learn more about heart-healthy foods.  Limit sodium if directed by your health care provider. Sodium restriction may reduce symptoms of heart failure in some people. Talk to a dietitian to learn more about heart-healthy seasonings.  Use healthy cooking methods. Healthy cooking methods include roasting, grilling, broiling, baking, poaching, steaming, or stir-frying. Talk to a dietitian to learn more about healthy cooking methods.  Limit fluids if directed by your health care provider. Fluid restriction may reduce symptoms of heart failure in some people.  Weigh yourself every day. Daily weights are important in the early recognition of excess fluid. You should weigh yourself every morning after you urinate and before you eat breakfast. Wear the same amount of  clothing each time you weigh yourself. Record your daily weight. Provide your health care provider with your weight record.  Monitor and record your blood pressure if directed by your health care provider.  Check your pulse if directed by your health care provider.  Lose weight if directed by your health care provider. Weight loss may reduce  symptoms of heart failure in some people.  Stop smoking or chewing tobacco. Nicotine makes your heart work harder by causing your blood vessels to constrict. Do not use nicotine gum or patches before talking to your health care provider.  Keep all follow-up visits as directed by your health care provider. This is important.  Limit alcohol intake to no more than 1 drink per day for nonpregnant women and 2 drinks per day for men. One drink equals 12 ounces of beer, 5 ounces of wine, or 1 ounces of hard liquor. Drinking more than that is harmful to your heart. Tell your health care provider if you drink alcohol several times a week. Talk with your health care provider about whether alcohol is safe for you. If your heart has already been damaged by alcohol or you have severe heart failure, drinking alcohol should be stopped completely.  Stop illicit drug use.  Stay up-to-date with immunizations. It is especially important to prevent respiratory infections through current pneumococcal and influenza immunizations.  Manage other health conditions such as hypertension, diabetes, thyroid disease, or abnormal heart rhythms as directed by your health care provider.  Learn to manage stress.  Plan rest periods when fatigued.  Learn strategies to manage high temperatures. If the weather is extremely hot:  Avoid vigorous physical activity.  Use air conditioning or fans or seek a cooler location.  Avoid caffeine and alcohol.  Wear loose-fitting, lightweight, and light-colored clothing.  Learn strategies to manage cold temperatures. If the weather is extremely cold:  Avoid vigorous physical activity.  Layer clothes.  Wear mittens or gloves, a hat, and a scarf when going outside.  Avoid alcohol.  Obtain ongoing education and support as needed.  Participate in or seek rehabilitation as needed to maintain or improve independence and quality of life. SEEK MEDICAL CARE IF:   You have a rapid  weight gain.  You have increasing shortness of breath that is unusual for you.  You are unable to participate in your usual physical activities.  You tire easily.  You cough more than normal, especially with physical activity.  You have any or more swelling in areas such as your hands, feet, ankles, or abdomen.  You are unable to sleep because it is hard to breathe.  You feel like your heart is beating fast (palpitations).  You become dizzy or light-headed upon standing up. SEEK IMMEDIATE MEDICAL CARE IF:   You have difficulty breathing.  There is a change in mental status such as decreased alertness or difficulty with concentration.  You have a pain or discomfort in your chest.  You have an episode of fainting (syncope). MAKE SURE YOU:   Understand these instructions.  Will watch your condition.  Will get help right away if you are not doing well or get worse.   This information is not intended to replace advice given to you by your health care provider. Make sure you discuss any questions you have with your health care provider.   Document Released: 03/11/2005 Document Revised: 07/26/2014 Document Reviewed: 04/10/2012 Elsevier Interactive Patient Education [employer]

## 2015-01-16 ENCOUNTER — Telehealth: Payer: Self-pay

## 2015-01-16 NOTE — Telephone Encounter (Signed)
Call placed to the patient to check on his status.  He said that he is " doing good."  He reported " no swelling" and stated that his breathing is " not bad."  He said that he has his medications all set up and he has been taking them as ordered and he will probably see Carollee Herter with [facility] next week. He reported no problems/ questions at this time. He said that Ms Bradly Bienenstock with the [facility] is still looking for an apartment for him and she may have one available soon.  He said that he is waiting to hear from her. He noted that his next appointment at [facility] is 01/30/15.

## 2015-01-18 ENCOUNTER — Telehealth: Payer: Self-pay

## 2015-01-18 ENCOUNTER — Encounter: Payer: Self-pay | Admitting: Vascular Surgery

## 2015-01-18 NOTE — Telephone Encounter (Signed)
This Case Manager placed call to patient to check on status. Patient indicated he was "doing good." He denied shortness of breath and indicated his pill box had been set-up, and he was taking medications as prescribed. Informed patient that per Arna Medici, Referral Coordinator at [facility], he needs to call [facility] at [phone] to schedule GI appointment. This Case Manager also reminded patient of Vascular appointment on 01/20/15 at 1100.  Provided patient with clinic address as well as phone number and stressed importance of him going to the appointment. Patient verbalized understanding. No additional needs/concerns identified.

## 2015-01-19 ENCOUNTER — Telehealth: Payer: Self-pay

## 2015-01-19 NOTE — Telephone Encounter (Signed)
This Case Manager placed call to patient to remind him once again of his upcoming Vascular appointment on 01/20/15 at 1100. Patient appreciative of call and indicated he would be at his Vascular appointment. Patient verified he has the clinic address as well as phone number.  Inquired if patient called [facility] to schedule GI appointment. He indicated he had not yet called, but he indicated he would call and schedule the appointment today. No additional needs/concerns identified.

## 2015-01-20 ENCOUNTER — Encounter: Payer: Medicaid Other | Admitting: Vascular Surgery

## 2015-01-20 ENCOUNTER — Telehealth: Payer: Self-pay

## 2015-01-20 ENCOUNTER — Encounter (HOSPITAL_COMMUNITY): Payer: Medicaid Other

## 2015-01-20 NOTE — Telephone Encounter (Signed)
This Case Manager received call from patient indicating he found out today that he would be moving into an apartment. He indicated he would not be able to make it to his Vascular appointment today. Spoke with Dr. Venetia Night who stressed that Vascular appointment was a priority. Congratulated patient on getting an apartment but stressed importance of going to his Vascular appointment today. Discussed patient possibly moving some of his belongings into his new apartment prior to his appointment, then going to appointment as scheduled, and continuing to move into apartment after appointment.  Patient hesitant about going to appointment, but this Case Manager reiterated the importance of not missing appointment as uncertain when clinic's next available appointment is.  Provided patient with Vascular and Vein Specialist contact information in case needed. Patient appreciative.

## 2015-01-24 ENCOUNTER — Encounter: Payer: Self-pay | Admitting: *Deleted

## 2015-01-27 ENCOUNTER — Telehealth: Payer: Self-pay

## 2015-01-27 NOTE — Telephone Encounter (Signed)
Addendum-Additional call placed to patient and informed him of his appointment with Dr. Noe Gens on 01/31/15 at 0930. Patient verbalized understanding.

## 2015-01-27 NOTE — Telephone Encounter (Signed)
This Case Manager placed call to patient to check on status. Patient indicated he was "doing good." Denied shortness of breath or any new problems or concerns with his health.  Inquired if patient moved in an apartment on 01/20/15, and he indicated he did. He provided new address, and demographics updated. Congratulated patient on his new home.  Reminded patient of his appointment on 01/30/15 at 1400 with Dr. Venetia Night. Patient verbalized understanding and indicated he would be at appointment. Patient indicated he planned to call [facility] and cancel appointment on 01/30/15 because of distance.  Inquired if patient scheduled GI appointment with [facility] in [facility], and patient indicated he had not yet scheduled appointment. Informed patient that he needs to make appointment as soon as possible, and he requested this Case Manager assist him with making appointment. Patient requested an afternoon appointment if possible. This Case Manager also informed patient he has a Vascular appointment on 02/28/15 at 1500. Patient verbalized understanding.  Placed call to [facility] ([address], [place], [place], [place] [phone]) and spoke with [place]. She indicated that Dr. Noe Gens does not see patients in the afternoon and only has morning appointments. GI appointment scheduled for 01/31/15 at 0930 with Dr. Noe Gens.  Placed call to patient to inform of upcoming appointment; however, unable to reach patient. Voicemail left requesting return call.

## 2015-01-30 ENCOUNTER — Telehealth: Payer: Self-pay | Admitting: Nurse Practitioner

## 2015-01-30 ENCOUNTER — Ambulatory Visit: Payer: Medicaid Other | Attending: Family Medicine | Admitting: Family Medicine

## 2015-01-30 ENCOUNTER — Ambulatory Visit: Payer: Medicaid Other | Admitting: Nurse Practitioner

## 2015-01-30 ENCOUNTER — Encounter: Payer: Self-pay | Admitting: Nurse Practitioner

## 2015-01-30 ENCOUNTER — Telehealth: Payer: Self-pay

## 2015-01-30 ENCOUNTER — Encounter: Payer: Self-pay | Admitting: Family Medicine

## 2015-01-30 VITALS — BP 156/89 | HR 141 | Resp 18 | Ht 66.0 in | Wt 226.0 lb

## 2015-01-30 DIAGNOSIS — I4891 Unspecified atrial fibrillation: Secondary | ICD-10-CM

## 2015-01-30 DIAGNOSIS — D509 Iron deficiency anemia, unspecified: Secondary | ICD-10-CM | POA: Diagnosis not present

## 2015-01-30 DIAGNOSIS — Z720 Tobacco use: Secondary | ICD-10-CM

## 2015-01-30 DIAGNOSIS — I739 Peripheral vascular disease, unspecified: Secondary | ICD-10-CM

## 2015-01-30 DIAGNOSIS — I10 Essential (primary) hypertension: Secondary | ICD-10-CM

## 2015-01-30 DIAGNOSIS — E1151 Type 2 diabetes mellitus with diabetic peripheral angiopathy without gangrene: Secondary | ICD-10-CM | POA: Diagnosis not present

## 2015-01-30 DIAGNOSIS — I5023 Acute on chronic systolic (congestive) heart failure: Secondary | ICD-10-CM

## 2015-01-30 DIAGNOSIS — K264 Chronic or unspecified duodenal ulcer with hemorrhage: Secondary | ICD-10-CM

## 2015-01-30 LAB — COMPREHENSIVE METABOLIC PANEL
ALK PHOS: 149 U/L — AB (ref 40–115)
ALT: 13 U/L (ref 9–46)
AST: 16 U/L (ref 10–35)
Albumin: 3.7 g/dL (ref 3.6–5.1)
BUN: 18 mg/dL (ref 7–25)
CO2: 26 mmol/L (ref 20–31)
CREATININE: 0.91 mg/dL (ref 0.70–1.33)
Calcium: 8.7 mg/dL (ref 8.6–10.3)
Chloride: 98 mmol/L (ref 98–110)
GLUCOSE: 201 mg/dL — AB (ref 65–99)
POTASSIUM: 5.5 mmol/L — AB (ref 3.5–5.3)
SODIUM: 134 mmol/L — AB (ref 135–146)
Total Bilirubin: 1.3 mg/dL — ABNORMAL HIGH (ref 0.2–1.2)
Total Protein: 7.3 g/dL (ref 6.1–8.1)

## 2015-01-30 LAB — GLUCOSE, POCT (MANUAL RESULT ENTRY): POC Glucose: 199 mg/dl — AB (ref 70–99)

## 2015-01-30 MED ORDER — ATORVASTATIN CALCIUM 40 MG PO TABS: 40.0000 mg | ORAL_TABLET | Freq: Every day | ORAL | Status: DC

## 2015-01-30 MED ORDER — DIGOXIN 62.5 MCG PO TABS: 0.0625 mg | ORAL_TABLET | Freq: Every day | ORAL | Status: DC

## 2015-01-30 MED ORDER — METOPROLOL SUCCINATE ER 25 MG PO TB24: 25.0000 mg | ORAL_TABLET | Freq: Two times a day (BID) | ORAL | Status: DC

## 2015-01-30 MED ORDER — GLIPIZIDE 10 MG PO TABS: 10.0000 mg | ORAL_TABLET | Freq: Two times a day (BID) | ORAL | Status: DC

## 2015-01-30 MED ORDER — FLUTICASONE-SALMETEROL 100-50 MCG/DOSE IN AEPB: 1.0000 | INHALATION_SPRAY | Freq: Two times a day (BID) | RESPIRATORY_TRACT | Status: DC

## 2015-01-30 MED ORDER — TRAMADOL HCL 50 MG PO TABS: 50.0000 mg | ORAL_TABLET | Freq: Three times a day (TID) | ORAL | Status: DC | PRN

## 2015-01-30 MED ORDER — RIVAROXABAN 20 MG PO TABS: 20.0000 mg | ORAL_TABLET | Freq: Every day | ORAL | Status: DC

## 2015-01-30 MED ORDER — PANTOPRAZOLE SODIUM 40 MG PO TBEC: 40.0000 mg | DELAYED_RELEASE_TABLET | Freq: Two times a day (BID) | ORAL | Status: DC

## 2015-01-30 MED ORDER — FUROSEMIDE 40 MG PO TABS: 40.0000 mg | ORAL_TABLET | Freq: Two times a day (BID) | ORAL | Status: DC

## 2015-01-30 MED ORDER — DOCUSATE SODIUM 100 MG PO CAPS: 100.0000 mg | ORAL_CAPSULE | Freq: Every day | ORAL | Status: DC

## 2015-01-30 MED ORDER — SPIRONOLACTONE 25 MG PO TABS: 12.5000 mg | ORAL_TABLET | Freq: Every day | ORAL | Status: DC

## 2015-01-30 NOTE — Patient Instructions (Signed)
Heart Failure  Heart failure means your heart has trouble pumping blood. This makes it hard for your body to work well. Heart failure is usually a long-term (chronic) condition. You must take good care of yourself and follow your doctor's treatment plan.  HOME CARE   Take your heart medicine as told by your doctor.    Do not stop taking medicine unless your doctor tells you to.    Do not skip any dose of medicine.    Refill your medicines before they run out.    Take other medicines only as told by your doctor or pharmacist.   Stay active if told by your doctor. The elderly and people with severe heart failure should talk with a doctor about physical activity.   Eat heart-healthy foods. Choose foods that are without trans fat and are low in saturated fat, cholesterol, and salt (sodium). This includes fresh or frozen fruits and vegetables, fish, lean meats, fat-free or low-fat dairy foods, whole grains, and high-fiber foods. Lentils and dried peas and beans (legumes) are also good choices.   Limit salt if told by your doctor.   Cook in a healthy way. Roast, grill, broil, bake, poach, steam, or stir-fry foods.   Limit fluids as told by your doctor.   Weigh yourself every morning. Do this after you pee (urinate) and before you eat breakfast. Write down your weight to give to your doctor.   Take your blood pressure and write it down if your doctor tells you to.   Ask your doctor how to check your pulse. Check your pulse as told.   Lose weight if told by your doctor.   Stop smoking or chewing tobacco. Do not use gum or patches that help you quit without your doctor's approval.   Schedule and go to doctor visits as told.   Nonpregnant women should have no more than 1 drink a day. Men should have no more than 2 drinks a day. Talk to your doctor about drinking alcohol.   Stop illegal drug use.   Stay current with shots (immunizations).   Manage your health conditions as told by your doctor.   Learn to  manage your stress.   Rest when you are tired.   If it is really hot outside:    Avoid intense activities.    Use air conditioning or fans, or get in a cooler place.    Avoid caffeine and alcohol.    Wear loose-fitting, lightweight, and light-colored clothing.   If it is really cold outside:    Avoid intense activities.    Layer your clothing.    Wear mittens or gloves, a hat, and a scarf when going outside.    Avoid alcohol.   Learn about heart failure and get support as needed.   Get help to maintain or improve your quality of life and your ability to care for yourself as needed.  GET HELP IF:    You gain weight quickly.   You are more short of breath than usual.   You cannot do your normal activities.   You tire easily.   You cough more than normal, especially with activity.   You have any or more puffiness (swelling) in areas such as your hands, feet, ankles, or belly (abdomen).   You cannot sleep because it is hard to breathe.   You feel like your heart is beating fast (palpitations).   You get dizzy or light-headed when you stand up.  GET HELP   RIGHT AWAY IF:    You have trouble breathing.   There is a change in mental status, such as becoming less alert or not being able to focus.   You have chest pain or discomfort.   You faint.  MAKE SURE YOU:    Understand these instructions.   Will watch your condition.   Will get help right away if you are not doing well or get worse.     This information is not intended to replace advice given to you by your health care provider. Make sure you discuss any questions you have with your health care provider.     Document Released: 12/19/2007 Document Revised: 04/01/2014 Document Reviewed: 04/27/2012  Elsevier Interactive Patient Education 2016 [employer]

## 2015-01-30 NOTE — Progress Notes (Signed)
Subjective:    Patient ID: Jacob [name], male    DOB: 07-17-1955, 59 y.o.   MRN: [id number]  HPI 60 year old male patient with a history of type 2 diabetes mellitus (A1c 6.9), hypertension, CHF (EF 30-35% from 11/2014), peripheral vascular disease, atrial fibrillation, anemia secondary to GI bleed from large bleeding duodenal ulcer.  He complains of slight dizziness at his appointment today but otherwise is doing well and denies shortness of breath or pedal edema. His last hemoglobin was 8.9 two weeks ago and he denies the presence of dark tarry stools or abdominal pain; he has an upcoming appointment with [facility] tomorrow.  He has been able to secure an apartment and no longer lives in his car; he has gained about 12 pounds since the last 2 weeks. Request refills of all his medications and complains that tramadol does not help much with his leg pains. He has claudication pains in his legs and has also missed appointments with vascular specialist of [place]-his next appointment next month.  Past Medical History  Diagnosis Date  . Hypertension   . Noncompliance     homelessness contributing.   Marland Kitchen CAD (coronary artery disease) Sept 2013    s/p cardiac cath showing occlusion of small RCA with collaterals  . Chronic anticoagulation     on xarelto.   . Atrial fibrillation (HCC)     RVR 10/2014  . Peripheral arterial disease (HCC)   . Automatic implantable cardioverter-defibrillator in situ   . High cholesterol   . Myocardial infarction (HCC) 2014  . Type II diabetes mellitus (HCC)   . CHF (congestive heart failure) (HCC)     20 to 25 % EF and RV dysfunction by 07/2014 echo     Past Surgical History  Procedure Laterality Date  . Implantable cardioverter defibrillator implant      [place] in 07/2012; [employer]  . Coronary artery bypass graft  09/2012    2 vessels per patient [name]   . Coronary angioplasty with stent placement  11/2011    "1"  . Cardiac catheterization   09/2012  . Iliac artery stent Right 08/30/2013  . Left and right heart catheterization with coronary angiogram N/A 12/02/2011    Procedure: LEFT AND RIGHT HEART CATHETERIZATION WITH CORONARY ANGIOGRAM;  Surgeon: Kathleene Hazel, MD;  Location: [facility] CATH LAB;  Service: Cardiovascular;  Laterality: N/A;  . Lower extremity angiogram N/A 08/30/2013    Procedure: LOWER EXTREMITY ANGIOGRAM;  Surgeon: Runell Gess, MD;  Location: [facility] CATH LAB;  Service: Cardiovascular;  Laterality: N/A;  . Lower extremity angiogram N/A 12/02/2013    Procedure: LOWER EXTREMITY ANGIOGRAM;  Surgeon: Runell Gess, MD;  Location: [facility] CATH LAB;  Service: Cardiovascular;  Laterality: N/A;  . Esophagogastroduodenoscopy N/A 11/28/2014    Procedure: ESOPHAGOGASTRODUODENOSCOPY (EGD);  Surgeon: Beverley Fiedler, MD;  Location: [facility] ENDOSCOPY;  Service: Endoscopy;  Laterality: N/A;    Social History   Social History  . Marital Status: Divorced    Spouse Name: N/A  . Number of Children: 3  . Years of Education: N/A   Occupational History  . Unemployed    Social History Main Topics  . Smoking status: Current Every Day Smoker -- 0.50 packs/day for 38 years    Types: Cigarettes  . Smokeless tobacco: Never Used  . Alcohol Use: No  . Drug Use: No  . Sexual Activity: Yes   Other Topics Concern  . Not on file   Social History Narrative   Has  an apartment with a roommate. He was living on the streets in 02-06-13.  He reports that his father died in [place] in 02-06-2013.  He is divorced.  He is no longer estranged from his son, but still from his daughter who lives locally.  Neither of his parents, nor any siblings have any history of CAD.    Allergies  Allergen Reactions  . Tape Itching    Paper tape please.    Current Outpatient Prescriptions on File Prior to Visit  Medication Sig Dispense Refill  . albuterol (PROVENTIL HFA;VENTOLIN HFA) 108 (90 BASE) MCG/ACT inhaler Inhale 2 puffs into the lungs every 6 (six)  hours as needed for wheezing or shortness of breath. 1 Inhaler 2  . albuterol (PROVENTIL) (2.5 MG/3ML) 0.083% nebulizer solution Take 3 mLs (2.5 mg total) by nebulization every 6 (six) hours as needed for wheezing or shortness of breath. 150 mL 1  . ferrous sulfate 325 (65 FE) MG tablet Take 1 tablet (325 mg total) by mouth 3 (three) times daily with meals. (Patient taking differently: Take 650 mg by mouth daily with breakfast. ) 90 tablet 2   No current facility-administered medications on file prior to visit.     Review of Systems  Constitutional: Negative for activity change and appetite change.  HENT: Negative for sinus pressure and sore throat.   Eyes: Negative for visual disturbance.  Respiratory: Negative for cough, chest tightness and shortness of breath.   Cardiovascular: Negative for chest pain and leg swelling.  Gastrointestinal: Negative for abdominal pain, diarrhea, constipation and abdominal distention.  Endocrine: Negative.   Genitourinary: Negative for dysuria.  Musculoskeletal: Negative for myalgias and joint swelling.       Positive for leg pains.  Skin: Negative for rash.  Allergic/Immunologic: Negative.   Neurological: Positive for light-headedness. Negative for weakness and numbness.  Psychiatric/Behavioral: Negative for suicidal ideas and dysphoric mood.       Objective: Filed Vitals:   01/30/15 1415  BP: 156/89  Pulse: 141  Resp: 18  Height: 5\' 6"  (1.676 m)  Weight: 226 lb (102.513 kg)  SpO2: 96%      Physical Exam Constitutional: He is oriented to person, place, and time. He appears well-developed and well-nourished.  Eyes: Conjunctivae and EOM are normal. Pupils are equal, round, and reactive to light.  Neck: Normal range of motion. No tracheal deviation present, no JVD.  Cardiovascular: Tachycardic rate, regular rhythm and normal heart sounds.  No murmur heard. Pulmonary/Chest: Effort normal and breath sounds normal. No respiratory distress. He has  no wheezes. He exhibits no tenderness.  Abdominal: Soft. Bowel sounds are normal. He exhibits no mass. There is no tenderness.  Musculoskeletal: Normal range of motion. He exhibits 1+ nonpitting pedal edema, no tenderness.  Neurological: He is alert and oriented to person, place, and time.  Skin: Skin is warm and dry.    Wt Readings from Last 3 Encounters:  01/30/15 226 lb (102.513 kg)  01/11/15 214 lb 2 oz (97.126 kg)  01/09/15 232 lb (105.235 kg)        Assessment & Plan:  1. Type 2 diabetes mellitus with other circulatory complication Controlled with A1c of 6.9, down form 8.2 Refill glipizide. - Glucose (CBG)   2. Essential hypertension Uncontrolled Due to running out of medication.  3. Chronic atrial fibrillation He remains in sinus rhythm at this time. Remains on anticoagulation with Xarelto; rate control with Metoprolol   4. PVD (peripheral vascular disease) Due to persisting claudication pain  he is on tramadol. He has missed several appointments with [facility] [place] and is scheduled to see them on 02/28/15   5. Anemia Secondary to Gastrointestinal hemorrhage, unspecified gastritis, unspecified gastrointestinal hemorrhage type Scheduled to see [facility] tomorrow. - CBC with Differential  6. Duodenal ulcer Asymtomatic Completed course of antibiotics   7. Acute exacerbation of CHF (congestive heart failure) EF 30-35%, s/p AICD Gained 12 pounds in the last 2 weeks Continue Lasix, BNP today Daily weights, limit fluids to 2L/day, low sodium, DASH, heart healthy diet  8. Tobacco abuse Discussed smoking cessation and the patient is not ready to quit at this time.  This note has been created with Education officer, environmental. Any transcriptional errors are unintentional.

## 2015-01-30 NOTE — Addendum Note (Signed)
Addended by: Benjamin Stain L on: 01/30/2015 03:04 PM   Modules accepted: Orders

## 2015-01-30 NOTE — Telephone Encounter (Signed)
PT WAS A NO SHOW AND LETTER SENT  °

## 2015-01-30 NOTE — Progress Notes (Signed)
Pt's here for 3 week f/up heart failure. CAD. Pt denies pain.  Pt reports feeling dizzy. Pt states he didn't take meds today because he ran out.  Pt requesting meds refills.

## 2015-01-30 NOTE — Telephone Encounter (Signed)
Noted  

## 2015-01-30 NOTE — Telephone Encounter (Signed)
Met with the patient when he was at the [facility] today for his appointment. He was very pleased to have finally moved into his apartment. He noted that [employer] has assisted him with obtaining some furniture and household supplies. In addtion, he noted that his brother in [place] is sending him some furniture.  The patient said that he has limited food in his apartment yet he would not accept any information about free meals; but he did accept information about food pantries in [place].    He said that he received a call from [facility] to remind him of his appointment tomorrow, 01/31/15 $[email or web address] .  Provided him with written directions to the clinic and he said that his nephew could help him with directions if needed. Stressed with him the importance of keeping that appointment and he said he would be there. Explained to him that only morning appointments were available and he stated that he understood.   Dr Jarold Song requested notes from his  previous 2 visits at [facility], as well as lab results be sent with the patient to his GI appointment tomorrow as the medical practice is not on EPIC.  Milinda Antis, [facility] notified of the request for the copies of the patient's medical record.

## 2015-01-31 ENCOUNTER — Other Ambulatory Visit: Payer: Self-pay | Admitting: Family Medicine

## 2015-01-31 LAB — CBC WITH DIFFERENTIAL/PLATELET
BASOS ABS: 0.1 10*3/uL (ref 0.0–0.1)
BASOS PCT: 1 % (ref 0–1)
EOS ABS: 0.1 10*3/uL (ref 0.0–0.7)
EOS PCT: 1 % (ref 0–5)
HCT: 36.4 % — ABNORMAL LOW (ref 39.0–52.0)
Hemoglobin: 10 g/dL — ABNORMAL LOW (ref 13.0–17.0)
Lymphocytes Relative: 9 % — ABNORMAL LOW (ref 12–46)
Lymphs Abs: 0.7 10*3/uL (ref 0.7–4.0)
MCH: 19.5 pg — ABNORMAL LOW (ref 26.0–34.0)
MCHC: 27.5 g/dL — ABNORMAL LOW (ref 30.0–36.0)
MCV: 70.8 fL — AB (ref 78.0–100.0)
Monocytes Absolute: 0.7 10*3/uL (ref 0.1–1.0)
Monocytes Relative: 8 % (ref 3–12)
NEUTROS PCT: 81 % — AB (ref 43–77)
Neutro Abs: 6.6 10*3/uL (ref 1.7–7.7)
PLATELETS: 237 10*3/uL (ref 150–400)
RBC: 5.14 MIL/uL (ref 4.22–5.81)
RDW: 23.9 % — AB (ref 11.5–15.5)
WBC: 8.2 10*3/uL (ref 4.0–10.5)

## 2015-01-31 LAB — BRAIN NATRIURETIC PEPTIDE: Brain Natriuretic Peptide: 1115.4 pg/mL — ABNORMAL HIGH (ref 0.0–100.0)

## 2015-01-31 LAB — DIGOXIN LEVEL: Digoxin Level: <0.5 ug/L — ABNORMAL LOW (ref 0.8–2.0)

## 2015-01-31 MED ORDER — FUROSEMIDE 40 MG PO TABS: 60.0000 mg | ORAL_TABLET | Freq: Two times a day (BID) | ORAL | Status: DC

## 2015-02-01 ENCOUNTER — Other Ambulatory Visit: Payer: Self-pay | Admitting: *Deleted

## 2015-02-01 ENCOUNTER — Other Ambulatory Visit: Payer: Self-pay | Admitting: Pharmacist

## 2015-02-01 ENCOUNTER — Telehealth: Payer: Self-pay | Admitting: Internal Medicine

## 2015-02-01 ENCOUNTER — Telehealth: Payer: Self-pay

## 2015-02-01 MED ORDER — GLUCOSE BLOOD VI STRP: ORAL_STRIP | Status: DC

## 2015-02-01 MED ORDER — ACCU-CHEK AVIVA PLUS W/DEVICE KIT: 1.0000 | PACK | Freq: Three times a day (TID) | Status: DC

## 2015-02-01 MED ORDER — ACCU-CHEK SOFTCLIX LANCET DEV MISC: Status: DC

## 2015-02-01 NOTE — Telephone Encounter (Signed)
-----   Message from Jaclyn Shaggy, MD sent at 01/31/2015 11:32 PM EST ----- I have increased his lasix to 60mg  (one and a half of his 40mg  tab) twice daily due to elevated BNP which indicates some fluid around the heart and hyperkalemia. His anemia is improving.

## 2015-02-01 NOTE — Telephone Encounter (Addendum)
CMA called [employer] interpreter Ronnell Freshwater [phone]. Pt was given lab results and instructed to take 1.5 tab (60mg  total) of the 40 mg lasix BID. Pt states that he understood with no further questions.

## 2015-02-01 NOTE — Telephone Encounter (Signed)
Patient DM supplies ordered.

## 2015-02-01 NOTE — Telephone Encounter (Signed)
Patient recently saw dr Venetia Night but pharmacy didn't receive his medication refills for his DM. Please follow up with pt as patient is in waiting room. Thank you.

## 2015-02-02 ENCOUNTER — Telehealth: Payer: Self-pay

## 2015-02-02 NOTE — Telephone Encounter (Signed)
02/02/15 1044-Received phone call from patient indicating his car broke down so he would not be able to get to his appointment at [facility]. Provided patient [facility] phone number, and patient indicated he would call and reschedule his appointment. Discussed importance of getting appointment as soon as possible. Patient verbalized understanding.

## 2015-02-03 ENCOUNTER — Telehealth: Payer: Self-pay

## 2015-02-03 NOTE — Telephone Encounter (Signed)
This Case Manager placed call to patient to check on status. Patient indicated he was "doing okay;" he denied shortness of breath. Patient indicated he was able to get his car fixed as car broke down yesterday prior to his GI appointment. Inquired if patient rescheduled his GI appointment with [facility]. He indicated he did, and his appointment not until 03/01/15. Patient aware of importance of going to appointment. No additional concerns identified.

## 2015-02-08 ENCOUNTER — Telehealth: Payer: Self-pay

## 2015-02-08 NOTE — Telephone Encounter (Signed)
Call placed to the patient to check on his status.  He stated that he is " not good" and is "short of breath." He reported that he was driving himself to [facility].   CM completed the call to allow him to drive.  He did not report any need to contact EMS and he stated that he was appreciative of the call.

## 2015-02-09 ENCOUNTER — Telehealth: Payer: Self-pay

## 2015-02-09 NOTE — Telephone Encounter (Signed)
This Case Manager placed call to patient to check on status as patient told Robyne Peers, RN CM on 02/08/15 that he was short of breath and was going to ED. Per chart review, patient did not go to the ED. Patient indicated he is no longer short of breath today and that he was doing "good."  Discussed when to go to ED if shortness of breath returns, and patient verbalized understanding.  Inquired if patient being compliant with medications. Patient indicated he was but needed assistance with setting up medication box.  [facility] for [employer] RN Case Manager had assisted patient in the past with medication management.  Voicemail left for Lucio Edward [phone]) to determine if she could assist patient with setting up his medication box. Awaiting return call. Patient also indicated he did not have furniture in his apartment-only a mattress. Patient inquired if there were any community resources that assist people with getting furniture. Voicemail left for [employer] a [employer]. Awaiting return call.

## 2015-02-09 NOTE — Telephone Encounter (Signed)
This Case Manager received return call from Lucio Edward, Case Manager with [facility] for [facility]. She indicated she was no longer following patient in the community; patient's Case Manager is now [place]. However, she indicated she would still set up patient's medication box.  She indicated patient should call her to arrange time and go to their office so medication box could be filled. Patient updated, and he verbalized understanding.

## 2015-02-10 ENCOUNTER — Telehealth: Payer: Self-pay

## 2015-02-10 NOTE — Telephone Encounter (Signed)
   This Case Manager spoke with employee at [employer], a furniture bank in [place], [place], as patient informed this Sports coach he needed furniture-only has a mattress. Was informed that they only accept referrals from partnering agencies.    This Case Manager placed call to patient to check on status and provide update on [employer]. Patient indicated he was "doing good." Patient denied shortness of breath or any new concerns. Updated patient on [employer]. He indicated he worked with Psychologist, clinical to get his apartment. Advised patient to contact [employer] to determine if they refer to [employer] for furniture and to inform them of his furniture needs.  Patient verbalized understanding.

## 2015-02-13 ENCOUNTER — Telehealth: Payer: Self-pay

## 2015-02-13 NOTE — Telephone Encounter (Signed)
Call placed to the patient to check on his status and to inform him that the [facility] has a comforter for him for his bed. He was very appreciative of the assistance provided.  He said that Chip Boer from [facility] called him about setting up his medications and he stated that he is not able to meet with her now and he set up his own medications.  He said that he received his food stamp money this morning and will be purchasing food today.  He reported no other problems/ concerns and did not report any shortness of breath or edema.   His next appointment at [facility] is 12/ 5/16.

## 2015-02-20 ENCOUNTER — Encounter: Payer: Self-pay | Admitting: *Deleted

## 2015-02-24 ENCOUNTER — Encounter: Payer: Self-pay | Admitting: Vascular Surgery

## 2015-02-24 ENCOUNTER — Telehealth: Payer: Self-pay

## 2015-02-24 NOTE — Telephone Encounter (Signed)
This Case Manager placed call to patient to check on status. Patient indicated he was "doing good." Patient denied shortness of breath and indicated he was taking his medications as prescribed. Reminded patient of follow-up appointment on 02/27/15 at 1400 with Dr. Venetia Night. Also reminded patient of his Vascular appointment on 02/28/15. Patient verbalized understanding and indicated he was aware of both appointments.  No additional needs identified.

## 2015-02-27 ENCOUNTER — Telehealth: Payer: Self-pay

## 2015-02-27 ENCOUNTER — Ambulatory Visit: Payer: Medicare Other | Attending: Family Medicine | Admitting: Family Medicine

## 2015-02-27 ENCOUNTER — Encounter: Payer: Self-pay | Admitting: Family Medicine

## 2015-02-27 VITALS — BP 146/88 | HR 133 | Temp 97.7°F | Resp 24 | Ht 66.0 in | Wt 236.0 lb

## 2015-02-27 DIAGNOSIS — I5042 Chronic combined systolic (congestive) and diastolic (congestive) heart failure: Secondary | ICD-10-CM

## 2015-02-27 DIAGNOSIS — R0602 Shortness of breath: Secondary | ICD-10-CM | POA: Diagnosis not present

## 2015-02-27 DIAGNOSIS — I482 Chronic atrial fibrillation, unspecified: Secondary | ICD-10-CM

## 2015-02-27 DIAGNOSIS — J439 Emphysema, unspecified: Secondary | ICD-10-CM | POA: Insufficient documentation

## 2015-02-27 DIAGNOSIS — Z79899 Other long term (current) drug therapy: Secondary | ICD-10-CM | POA: Insufficient documentation

## 2015-02-27 DIAGNOSIS — I509 Heart failure, unspecified: Secondary | ICD-10-CM | POA: Diagnosis not present

## 2015-02-27 DIAGNOSIS — F17210 Nicotine dependence, cigarettes, uncomplicated: Secondary | ICD-10-CM | POA: Insufficient documentation

## 2015-02-27 DIAGNOSIS — K264 Chronic or unspecified duodenal ulcer with hemorrhage: Secondary | ICD-10-CM | POA: Diagnosis not present

## 2015-02-27 DIAGNOSIS — I252 Old myocardial infarction: Secondary | ICD-10-CM | POA: Diagnosis not present

## 2015-02-27 DIAGNOSIS — R739 Hyperglycemia, unspecified: Secondary | ICD-10-CM | POA: Insufficient documentation

## 2015-02-27 DIAGNOSIS — I2510 Atherosclerotic heart disease of native coronary artery without angina pectoris: Secondary | ICD-10-CM | POA: Diagnosis not present

## 2015-02-27 DIAGNOSIS — R05 Cough: Secondary | ICD-10-CM | POA: Diagnosis not present

## 2015-02-27 DIAGNOSIS — I739 Peripheral vascular disease, unspecified: Secondary | ICD-10-CM | POA: Insufficient documentation

## 2015-02-27 DIAGNOSIS — M79604 Pain in right leg: Secondary | ICD-10-CM | POA: Insufficient documentation

## 2015-02-27 DIAGNOSIS — Z7901 Long term (current) use of anticoagulants: Secondary | ICD-10-CM | POA: Insufficient documentation

## 2015-02-27 DIAGNOSIS — D649 Anemia, unspecified: Secondary | ICD-10-CM | POA: Insufficient documentation

## 2015-02-27 DIAGNOSIS — I10 Essential (primary) hypertension: Secondary | ICD-10-CM | POA: Diagnosis not present

## 2015-02-27 DIAGNOSIS — I4891 Unspecified atrial fibrillation: Secondary | ICD-10-CM | POA: Diagnosis not present

## 2015-02-27 DIAGNOSIS — E7800 Pure hypercholesterolemia, unspecified: Secondary | ICD-10-CM | POA: Diagnosis not present

## 2015-02-27 DIAGNOSIS — Z590 Homelessness: Secondary | ICD-10-CM | POA: Insufficient documentation

## 2015-02-27 LAB — COMPREHENSIVE METABOLIC PANEL
ALBUMIN: 3.5 g/dL — AB (ref 3.6–5.1)
ALK PHOS: 168 U/L — AB (ref 40–115)
ALT: 12 U/L (ref 9–46)
AST: 17 U/L (ref 10–35)
BUN: 30 mg/dL — ABNORMAL HIGH (ref 7–25)
CALCIUM: 8.6 mg/dL (ref 8.6–10.3)
CO2: 29 mmol/L (ref 20–31)
Chloride: 94 mmol/L — ABNORMAL LOW (ref 98–110)
Creat: 1.19 mg/dL (ref 0.70–1.33)
GLUCOSE: 242 mg/dL — AB (ref 65–99)
POTASSIUM: 4.9 mmol/L (ref 3.5–5.3)
Sodium: 134 mmol/L — ABNORMAL LOW (ref 135–146)
Total Bilirubin: 1.7 mg/dL — ABNORMAL HIGH (ref 0.2–1.2)
Total Protein: 7 g/dL (ref 6.1–8.1)

## 2015-02-27 LAB — CBC
HEMATOCRIT: 39.3 % (ref 39.0–52.0)
Hemoglobin: 10.6 g/dL — ABNORMAL LOW (ref 13.0–17.0)
MCH: 19 pg — ABNORMAL LOW (ref 26.0–34.0)
MCHC: 27 g/dL — ABNORMAL LOW (ref 30.0–36.0)
MCV: 70.3 fL — AB (ref 78.0–100.0)
PLATELETS: 213 10*3/uL (ref 150–400)
RBC: 5.59 MIL/uL (ref 4.22–5.81)
RDW: 22.6 % — AB (ref 11.5–15.5)
WBC: 7 10*3/uL (ref 4.0–10.5)

## 2015-02-27 LAB — GLUCOSE, POCT (MANUAL RESULT ENTRY): POC GLUCOSE: 258 mg/dL — AB (ref 70–99)

## 2015-02-27 MED ORDER — FUROSEMIDE 40 MG PO TABS: 80.0000 mg | ORAL_TABLET | Freq: Two times a day (BID) | ORAL | Status: DC

## 2015-02-27 MED ORDER — METOPROLOL SUCCINATE ER 25 MG PO TB24: 50.0000 mg | ORAL_TABLET | Freq: Every day | ORAL | Status: DC

## 2015-02-27 NOTE — Progress Notes (Signed)
Subjective:    Patient ID: Jacob [name], male    DOB: 1955-06-25, 59 y.o.   MRN: [id number]  HPI 59 year old male with a history of type 2 diabetes mellitus (A1c 6.9), hypertension, atrial fibrillation, congestive heart failure (EF 30-35%), emphysema, peripheral vascular disease, anemia secondary to bleeding duodenal ulcer who comes in today for follow-up visit.  He complains of shortness of breath, orthopnea and reduced exercise tolerance. States he has been compliant with all his medications but does not have them with him today. He previously received services from [facility] where his pillbox was being filled every week but he said this service have since been discontinued. He continues to eat fast food which I have educated him is high in sodium content and he is aware but this is what he is able to afford. Also has cough pain in the right lower extremity which is chronic and is worse with activity reduced at rest. Continues to smoke and is not ready to quit at this time. Scheduled to see vascular surgery tomorrow.`  Denies hematochezia, abdominal pain he has no chest pain. He frequently has to be reminded about his appointments as evidenced by the fact that he has no-showed multiple appointments to specialists..  Past Medical History  Diagnosis Date  . Hypertension   . Noncompliance     homelessness contributing.   Marland Kitchen CAD (coronary artery disease) Sept 2013    s/p cardiac cath showing occlusion of small RCA with collaterals  . Chronic anticoagulation     on xarelto.   . Atrial fibrillation ([place])     RVR 10/2014  . Peripheral arterial disease ([place])   . Automatic implantable cardioverter-defibrillator in situ   . High cholesterol   . Myocardial infarction ([place]) 2014  . Type II diabetes mellitus ([place])   . CHF (congestive heart failure) ([place])     20 to 25 % EF and RV dysfunction by 07/2014 echo     Past Surgical History  Procedure Laterality Date  . Implantable cardioverter  defibrillator implant      [place] in 07/2012; [employer]  . Coronary artery bypass graft  09/2012    2 vessels per patient [name])   . Coronary angioplasty with stent placement  11/2011    "1"  . Cardiac catheterization  09/2012  . Iliac artery stent Right 08/30/2013  . Left and right heart catheterization with coronary angiogram N/A 12/02/2011    Procedure: LEFT AND RIGHT HEART CATHETERIZATION WITH CORONARY ANGIOGRAM;  Surgeon: Burnell Blanks, MD;  Location: [facility] CATH LAB;  Service: Cardiovascular;  Laterality: N/A;  . Lower extremity angiogram N/A 08/30/2013    Procedure: LOWER EXTREMITY ANGIOGRAM;  Surgeon: Lorretta Harp, MD;  Location[facility] C[facility] LAB;  Service: Cardiovascular;  Laterality: N/A;  . Lower extremity angiogram N/A 12/02/2013    Procedure: LOWER EXTREMITY ANGIOGRAM;  Surgeon: Lorretta Harp, MD;  Location: [facility] CATH LAB;  Service: Cardiovascular;  Laterality: N/A;  . Esophagogastroduodenoscopy N/A 11/28/2014    Procedure: ESOPHAGOGASTRODUODENOSCOPY (EGD);  Surgeon: Jerene Bears, MD;  Location: [facility] ENDOSCOPY;  Service: Endoscopy;  Laterality: N/A;    Social History   Social History  . Marital Status: Divorced    Spouse Name: N/A  . Number of Children: 3  . Years of Education: N/A   Occupational History  . Unemployed    Social History Main Topics  . Smoking status: Current Every Day Smoker -- 0.50 packs/day for 38 years    Types: Cigarettes  . Smokeless  tobacco: Never Used  . Alcohol Use: No  . Drug Use: No  . Sexual Activity: Yes   Other Topics Concern  . Not on file   Social History Narrative   Has an apartment with a roommate. He was living on the streets in 01-20-13.  He reports that his father died in [place] in 01/20/2013.  He is divorced.  He is no longer estranged from his son, but still from his daughter who lives locally.  Neither of his parents, nor any siblings have any history of CAD.    Allergies  Allergen Reactions  . Tape Itching     Paper tape please.    Current Outpatient Prescriptions on File Prior to Visit  Medication Sig Dispense Refill  . albuterol (PROVENTIL HFA;VENTOLIN HFA) 108 (90 BASE) MCG/ACT inhaler Inhale 2 puffs into the lungs every 6 (six) hours as needed for wheezing or shortness of breath. 1 Inhaler 2  . albuterol (PROVENTIL) (2.5 MG/3ML) 0.083% nebulizer solution Take 3 mLs (2.5 mg total) by nebulization every 6 (six) hours as needed for wheezing or shortness of breath. 150 mL 1  . atorvastatin (LIPITOR) 40 MG tablet Take 1 tablet (40 mg total) by mouth daily at 6 PM. 30 tablet 2  . Blood Glucose Monitoring Suppl (ACCU-CHEK AVIVA PLUS) W/DEVICE KIT 1 each by Does not apply route 3 (three) times daily. 1 kit 0  . Digoxin 62.5 MCG TABS Take 0.0625 mg by mouth daily. 30 tablet 2  . docusate sodium (COLACE) 100 MG capsule Take 1 capsule (100 mg total) by mouth daily. 30 capsule 2  . ferrous sulfate 325 (65 FE) MG tablet Take 1 tablet (325 mg total) by mouth 3 (three) times daily with meals. (Patient taking differently: Take 650 mg by mouth daily with breakfast. ) 90 tablet 2  . Fluticasone-Salmeterol (ADVAIR) 100-50 MCG/DOSE AEPB Inhale 1 puff into the lungs 2 (two) times daily. 60 each 3  . glipiZIDE (GLUCOTROL) 10 MG tablet Take 1 tablet (10 mg total) by mouth 2 (two) times daily before a meal. 60 tablet 2  . glucose blood (ACCU-CHEK AVIVA) test strip Use as instructed 100 each 12  . Lancet Devices (ACCU-CHEK SOFTCLIX) lancets Use as instructed 1 each 0  . pantoprazole (PROTONIX) 40 MG tablet Take 1 tablet (40 mg total) by mouth 2 (two) times daily. 60 tablet 2  . rivaroxaban (XARELTO) 20 MG TABS tablet Take 1 tablet (20 mg total) by mouth daily before supper. 30 tablet 2  . spironolactone (ALDACTONE) 25 MG tablet Take 0.5 tablets (12.5 mg total) by mouth daily. 30 tablet 2  . traMADol (ULTRAM) 50 MG tablet Take 1 tablet (50 mg total) by mouth every 8 (eight) hours as needed for moderate pain. 60 tablet 1    No current facility-administered medications on file prior to visit.      Review of Systems  Constitutional: Negative for activity change and appetite change.  HENT: Negative for sinus pressure and sore throat.   Eyes: Negative for visual disturbance.  Respiratory: Positive for shortness of breath. Negative for cough and chest tightness.   Cardiovascular: Negative for chest pain and leg swelling.       Positive for orthopnea  Gastrointestinal: Negative for abdominal pain, diarrhea, constipation and abdominal distention.  Endocrine: Negative.   Genitourinary: Negative for dysuria.  Musculoskeletal: Negative for myalgias and joint swelling.  Skin: Negative for rash.  Allergic/Immunologic: Negative.   Neurological: Negative for weakness, light-headedness and numbness.  Psychiatric/Behavioral:  Negative for suicidal ideas and dysphoric mood.       Objective: Filed Vitals:   02/27/15 1418  BP: 146/88  Pulse: 133  Temp: 97.7 F (36.5 C)  TempSrc: Oral  Resp: 24  Height: $Remove'5\' 6"'NsXRnid$  (1.676 m)  Weight: 236 lb (107.049 kg)  SpO2: 99%      Physical Exam  Constitutional: He is oriented to person, place, and time. He appears well-developed and well-nourished.  HENT:  Head: Normocephalic and atraumatic.  Right Ear: External ear normal.  Left Ear: External ear normal.  Eyes: Conjunctivae and EOM are normal. Pupils are equal, round, and reactive to light.  Neck: Normal range of motion. Neck supple. No tracheal deviation present.  Cardiovascular: Regular rhythm and normal heart sounds.  Tachycardia present.   No murmur heard. Dorsalis pedis difficult to palpate bilaterally.  Pulmonary/Chest: Effort normal and breath sounds normal. No respiratory distress. He has no wheezes. He exhibits no tenderness.  Abdominal: Soft. Bowel sounds are normal. He exhibits no mass. There is no tenderness.  Musculoskeletal: Normal range of motion. He exhibits no edema or tenderness.  Neurological: He is  alert and oriented to person, place, and time.  Skin: Skin is warm and dry.  Psychiatric: He has a normal mood and affect.    Wt Readings from Last 3 Encounters:  02/27/15 236 lb (107.049 kg)  01/30/15 226 lb (102.513 kg)  01/11/15 214 lb 2 oz (97.126 kg)        Assessment & Plan:  Atrial fibrillation: Rate is currently uncontrolled on metoprolol and so I will increase the dose from 25 to 50 mg. Continue digoxin, will send off levels today. Anticoagulation with  Eliquis  Congestive heart failure: NYHA 3, EF 30 - 35% Weight is up 10 pounds in the last 1 month. Due to current symptoms of shortness of breath I'm suspecting fluid overload and will increase his Lasix from a total daily dose of 120 mg to 160 mg. BNP today as well as basic metabolic panel and I will repeat at his next visit.  Type 2 diabetes mellitus: Controlled with A1c of 6.9. Continue medications.  Hypertension: Blood pressure is slightly above goal of less than 140/90 Hopefully increasing Medipore low-dose will bring about some improvement.  Emphysema: Continue Advair and Proventil as needed.  Peripheral vascular disease: Continues to have intermittent claudication and he scheduled to see vascular surgery tomorrow.  Anemia secondary to bleeding duodenal ulcer: He missed his appointment with [facility] and will reschedule.  This note has been created with Surveyor, quantity. Any transcriptional errors are unintentional.

## 2015-02-27 NOTE — Patient Instructions (Signed)

## 2015-02-27 NOTE — Telephone Encounter (Signed)
Call placed to [facility] # [phone] to confirm the date of the patient's GI appointment.   This CM spoke to Shanda Bumps who stated that his GI appointment is 03/06/15 @ 1115.  Call then placed to the patient to inform him that his GI appointment is 03/06/15 @ 1115. He was appreciative of the call and stated that he would come to the [facility] before his appointment to get directions. He then stated that he has an appointment with vascular tomorrow.

## 2015-02-27 NOTE — Progress Notes (Signed)
Pt's here for 1 wk f/u CHF. Patient reports not feeling well. Pt states he's tired and trouble sleeping. Patient reports SOB due to his asthma. Pt rates pain at 7/10 today. Describes pain in chest as crushing.  Pt states that he's taken his meds today.  After asking patient for urine sample, at end of visit, patient declined.

## 2015-02-28 ENCOUNTER — Telehealth: Payer: Self-pay | Admitting: Internal Medicine

## 2015-02-28 ENCOUNTER — Encounter (HOSPITAL_COMMUNITY): Payer: Medicaid Other

## 2015-02-28 ENCOUNTER — Encounter: Payer: Medicaid Other | Admitting: Vascular Surgery

## 2015-02-28 LAB — DIGOXIN LEVEL: Digoxin Level: <0.5 ug/L — ABNORMAL LOW (ref 0.8–2.0)

## 2015-02-28 LAB — BRAIN NATRIURETIC PEPTIDE: Brain Natriuretic Peptide: 996.8 pg/mL — ABNORMAL HIGH (ref 0.0–100.0)

## 2015-02-28 NOTE — Telephone Encounter (Signed)
Patient was referred to Vascular and vein specialist and Juliette Alcide is calling to get clarification as she saw that pt has established care at another facility that specializes in this same area. Please follow up with Melissa as patient has also no showed several times. Thank you.

## 2015-02-28 NOTE — Telephone Encounter (Signed)
Please, forwarder the message to his pcp nurse   Thank You .

## 2015-03-03 NOTE — Telephone Encounter (Signed)
RN out on FMLA when this call was routed.  Patient has been seen in clinic since.

## 2015-03-14 ENCOUNTER — Inpatient Hospital Stay (HOSPITAL_COMMUNITY)
Admission: EM | Admit: 2015-03-14 | Discharge: 2015-03-23 | DRG: 291 | Disposition: A | Discharge status: Home / Self Care | Payer: Medicare Other | Attending: Internal Medicine | Admitting: Internal Medicine

## 2015-03-14 ENCOUNTER — Emergency Department (HOSPITAL_COMMUNITY): Payer: Medicare Other

## 2015-03-14 DIAGNOSIS — R079 Chest pain, unspecified: Secondary | ICD-10-CM | POA: Diagnosis not present

## 2015-03-14 DIAGNOSIS — E871 Hypo-osmolality and hyponatremia: Secondary | ICD-10-CM | POA: Diagnosis present

## 2015-03-14 DIAGNOSIS — I255 Ischemic cardiomyopathy: Secondary | ICD-10-CM | POA: Diagnosis present

## 2015-03-14 DIAGNOSIS — R7881 Bacteremia: Secondary | ICD-10-CM

## 2015-03-14 DIAGNOSIS — E119 Type 2 diabetes mellitus without complications: Secondary | ICD-10-CM | POA: Diagnosis present

## 2015-03-14 DIAGNOSIS — R918 Other nonspecific abnormal finding of lung field: Secondary | ICD-10-CM | POA: Diagnosis not present

## 2015-03-14 DIAGNOSIS — N179 Acute kidney failure, unspecified: Secondary | ICD-10-CM | POA: Diagnosis present

## 2015-03-14 DIAGNOSIS — Z794 Long term (current) use of insulin: Secondary | ICD-10-CM

## 2015-03-14 DIAGNOSIS — Z452 Encounter for adjustment and management of vascular access device: Secondary | ICD-10-CM | POA: Diagnosis not present

## 2015-03-14 DIAGNOSIS — K269 Duodenal ulcer, unspecified as acute or chronic, without hemorrhage or perforation: Secondary | ICD-10-CM

## 2015-03-14 DIAGNOSIS — I481 Persistent atrial fibrillation: Secondary | ICD-10-CM | POA: Diagnosis present

## 2015-03-14 DIAGNOSIS — I482 Chronic atrial fibrillation, unspecified: Secondary | ICD-10-CM

## 2015-03-14 DIAGNOSIS — I4891 Unspecified atrial fibrillation: Secondary | ICD-10-CM | POA: Diagnosis not present

## 2015-03-14 DIAGNOSIS — E1159 Type 2 diabetes mellitus with other circulatory complications: Secondary | ICD-10-CM

## 2015-03-14 DIAGNOSIS — Z951 Presence of aortocoronary bypass graft: Secondary | ICD-10-CM

## 2015-03-14 DIAGNOSIS — I5043 Acute on chronic combined systolic (congestive) and diastolic (congestive) heart failure: Secondary | ICD-10-CM

## 2015-03-14 DIAGNOSIS — Z720 Tobacco use: Secondary | ICD-10-CM

## 2015-03-14 DIAGNOSIS — R0789 Other chest pain: Secondary | ICD-10-CM

## 2015-03-14 DIAGNOSIS — E1151 Type 2 diabetes mellitus with diabetic peripheral angiopathy without gangrene: Secondary | ICD-10-CM

## 2015-03-14 DIAGNOSIS — Z95810 Presence of automatic (implantable) cardiac defibrillator: Secondary | ICD-10-CM | POA: Diagnosis not present

## 2015-03-14 DIAGNOSIS — N189 Chronic kidney disease, unspecified: Secondary | ICD-10-CM | POA: Diagnosis present

## 2015-03-14 DIAGNOSIS — I739 Peripheral vascular disease, unspecified: Secondary | ICD-10-CM

## 2015-03-14 DIAGNOSIS — J449 Chronic obstructive pulmonary disease, unspecified: Secondary | ICD-10-CM | POA: Diagnosis present

## 2015-03-14 DIAGNOSIS — R0603 Acute respiratory distress: Secondary | ICD-10-CM

## 2015-03-14 DIAGNOSIS — E876 Hypokalemia: Secondary | ICD-10-CM | POA: Diagnosis not present

## 2015-03-14 DIAGNOSIS — R0602 Shortness of breath: Secondary | ICD-10-CM

## 2015-03-14 DIAGNOSIS — I130 Hypertensive heart and chronic kidney disease with heart failure and stage 1 through stage 4 chronic kidney disease, or unspecified chronic kidney disease: Secondary | ICD-10-CM | POA: Diagnosis present

## 2015-03-14 DIAGNOSIS — R0600 Dyspnea, unspecified: Secondary | ICD-10-CM

## 2015-03-14 DIAGNOSIS — R570 Cardiogenic shock: Secondary | ICD-10-CM

## 2015-03-14 DIAGNOSIS — I2510 Atherosclerotic heart disease of native coronary artery without angina pectoris: Secondary | ICD-10-CM | POA: Diagnosis present

## 2015-03-14 DIAGNOSIS — I1310 Hypertensive heart and chronic kidney disease without heart failure, with stage 1 through stage 4 chronic kidney disease, or unspecified chronic kidney disease: Secondary | ICD-10-CM

## 2015-03-14 DIAGNOSIS — N19 Unspecified kidney failure: Secondary | ICD-10-CM | POA: Diagnosis not present

## 2015-03-14 DIAGNOSIS — R6889 Other general symptoms and signs: Secondary | ICD-10-CM

## 2015-03-14 DIAGNOSIS — Z6839 Body mass index (BMI) 39.0-39.9, adult: Secondary | ICD-10-CM | POA: Diagnosis not present

## 2015-03-14 DIAGNOSIS — D62 Acute posthemorrhagic anemia: Secondary | ICD-10-CM

## 2015-03-14 DIAGNOSIS — R768 Other specified abnormal immunological findings in serum: Secondary | ICD-10-CM

## 2015-03-14 DIAGNOSIS — J9600 Acute respiratory failure, unspecified whether with hypoxia or hypercapnia: Secondary | ICD-10-CM | POA: Diagnosis present

## 2015-03-14 DIAGNOSIS — Z9119 Patient's noncompliance with other medical treatment and regimen: Secondary | ICD-10-CM

## 2015-03-14 DIAGNOSIS — I252 Old myocardial infarction: Secondary | ICD-10-CM

## 2015-03-14 DIAGNOSIS — I5023 Acute on chronic systolic (congestive) heart failure: Secondary | ICD-10-CM | POA: Diagnosis not present

## 2015-03-14 DIAGNOSIS — Z7901 Long term (current) use of anticoagulants: Secondary | ICD-10-CM | POA: Diagnosis not present

## 2015-03-14 DIAGNOSIS — I5042 Chronic combined systolic (congestive) and diastolic (congestive) heart failure: Secondary | ICD-10-CM

## 2015-03-14 DIAGNOSIS — I2583 Coronary atherosclerosis due to lipid rich plaque: Secondary | ICD-10-CM

## 2015-03-14 DIAGNOSIS — F17200 Nicotine dependence, unspecified, uncomplicated: Secondary | ICD-10-CM

## 2015-03-14 DIAGNOSIS — D638 Anemia in other chronic diseases classified elsewhere: Secondary | ICD-10-CM

## 2015-03-14 DIAGNOSIS — D509 Iron deficiency anemia, unspecified: Secondary | ICD-10-CM

## 2015-03-14 DIAGNOSIS — R578 Other shock: Secondary | ICD-10-CM

## 2015-03-14 DIAGNOSIS — E118 Type 2 diabetes mellitus with unspecified complications: Secondary | ICD-10-CM | POA: Diagnosis not present

## 2015-03-14 DIAGNOSIS — Z79899 Other long term (current) drug therapy: Secondary | ICD-10-CM | POA: Diagnosis not present

## 2015-03-14 DIAGNOSIS — I5041 Acute combined systolic (congestive) and diastolic (congestive) heart failure: Secondary | ICD-10-CM

## 2015-03-14 DIAGNOSIS — I509 Heart failure, unspecified: Secondary | ICD-10-CM | POA: Diagnosis not present

## 2015-03-14 DIAGNOSIS — J9601 Acute respiratory failure with hypoxia: Secondary | ICD-10-CM

## 2015-03-14 DIAGNOSIS — Z91199 Patient's noncompliance with other medical treatment and regimen due to unspecified reason: Secondary | ICD-10-CM

## 2015-03-14 DIAGNOSIS — K264 Chronic or unspecified duodenal ulcer with hemorrhage: Secondary | ICD-10-CM

## 2015-03-14 DIAGNOSIS — F17210 Nicotine dependence, cigarettes, uncomplicated: Secondary | ICD-10-CM | POA: Diagnosis not present

## 2015-03-14 DIAGNOSIS — I10 Essential (primary) hypertension: Secondary | ICD-10-CM

## 2015-03-14 LAB — I-STAT TROPONIN, ED: TROPONIN I, POC: 0.02 ng/mL (ref 0.00–0.08)

## 2015-03-14 LAB — CBC
HCT: 37.4 % — ABNORMAL LOW (ref 39.0–52.0)
HEMOGLOBIN: 9.7 g/dL — AB (ref 13.0–17.0)
MCH: 18.3 pg — ABNORMAL LOW (ref 26.0–34.0)
MCHC: 25.9 g/dL — ABNORMAL LOW (ref 30.0–36.0)
MCV: 70.7 fL — AB (ref 78.0–100.0)
PLATELETS: 287 10*3/uL (ref 150–400)
RBC: 5.29 MIL/uL (ref 4.22–5.81)
RDW: 23.3 % — ABNORMAL HIGH (ref 11.5–15.5)
WBC: 8 10*3/uL (ref 4.0–10.5)

## 2015-03-14 LAB — BASIC METABOLIC PANEL
ANION GAP: 9 (ref 5–15)
BUN: 24 mg/dL — ABNORMAL HIGH (ref 6–20)
CHLORIDE: 98 mmol/L — AB (ref 101–111)
CO2: 27 mmol/L (ref 22–32)
CREATININE: 1.17 mg/dL (ref 0.61–1.24)
Calcium: 8.6 mg/dL — ABNORMAL LOW (ref 8.9–10.3)
GFR calc non Af Amer: >60 mL/min (ref >60)
Glucose, Bld: 319 mg/dL — ABNORMAL HIGH (ref 65–99)
Potassium: 5.2 mmol/L — ABNORMAL HIGH (ref 3.5–5.1)
SODIUM: 134 mmol/L — AB (ref 135–145)

## 2015-03-14 LAB — PROTIME-INR
INR: 1.59 — ABNORMAL HIGH (ref 0.00–1.49)
Prothrombin Time: 19 seconds — ABNORMAL HIGH (ref 11.6–15.2)

## 2015-03-14 LAB — DIGOXIN LEVEL: DIGOXIN LVL: 0.6 ng/mL — AB (ref 0.8–2.0)

## 2015-03-14 LAB — BRAIN NATRIURETIC PEPTIDE: B Natriuretic Peptide: 746.2 pg/mL — ABNORMAL HIGH (ref 0.0–100.0)

## 2015-03-14 MED ORDER — INSULIN ASPART 100 UNIT/ML ~~LOC~~ SOLN
0.0000 [IU] | Freq: Three times a day (TID) | SUBCUTANEOUS | Status: DC
  Administered 2015-03-15: 3 [IU] via SUBCUTANEOUS
  Administered 2015-03-15: 2 [IU] via SUBCUTANEOUS
  Administered 2015-03-15: 3 [IU] via SUBCUTANEOUS
  Administered 2015-03-16: 5 [IU] via SUBCUTANEOUS
  Administered 2015-03-16: 2 [IU] via SUBCUTANEOUS
  Administered 2015-03-17 (×2): 3 [IU] via SUBCUTANEOUS
  Administered 2015-03-17: 5 [IU] via SUBCUTANEOUS
  Administered 2015-03-18: 3 [IU] via SUBCUTANEOUS
  Administered 2015-03-18 – 2015-03-19 (×3): 5 [IU] via SUBCUTANEOUS
  Administered 2015-03-19 (×2): 8 [IU] via SUBCUTANEOUS

## 2015-03-14 MED ORDER — SPIRONOLACTONE 25 MG PO TABS
12.5000 mg | ORAL_TABLET | Freq: Every day | ORAL | Status: DC
  Administered 2015-03-15 – 2015-03-16 (×2): 12.5 mg via ORAL
  Filled 2015-03-14 (×2): qty 1

## 2015-03-14 MED ORDER — TRAMADOL HCL 50 MG PO TABS
50.0000 mg | ORAL_TABLET | Freq: Three times a day (TID) | ORAL | Status: DC | PRN
  Administered 2015-03-15 – 2015-03-20 (×6): 50 mg via ORAL
  Filled 2015-03-14 (×6): qty 1

## 2015-03-14 MED ORDER — FUROSEMIDE 10 MG/ML IJ SOLN: 40.0000 mg | Freq: Once | INTRAMUSCULAR | Status: DC

## 2015-03-14 MED ORDER — PANTOPRAZOLE SODIUM 40 MG PO TBEC
40.0000 mg | DELAYED_RELEASE_TABLET | Freq: Two times a day (BID) | ORAL | Status: DC
  Administered 2015-03-15 – 2015-03-23 (×18): 40 mg via ORAL
  Filled 2015-03-14 (×18): qty 1

## 2015-03-14 MED ORDER — DOCUSATE SODIUM 100 MG PO CAPS
100.0000 mg | ORAL_CAPSULE | Freq: Every day | ORAL | Status: DC
  Administered 2015-03-15 – 2015-03-23 (×9): 100 mg via ORAL
  Filled 2015-03-14 (×9): qty 1

## 2015-03-14 MED ORDER — RIVAROXABAN 20 MG PO TABS
20.0000 mg | ORAL_TABLET | Freq: Every day | ORAL | Status: DC
  Administered 2015-03-15 – 2015-03-23 (×9): 20 mg via ORAL
  Filled 2015-03-14 (×9): qty 1

## 2015-03-14 MED ORDER — METOPROLOL TARTRATE 25 MG PO TABS
25.0000 mg | ORAL_TABLET | Freq: Four times a day (QID) | ORAL | Status: DC
  Administered 2015-03-15: 25 mg via ORAL
  Filled 2015-03-14: qty 1

## 2015-03-14 MED ORDER — RIVAROXABAN 20 MG PO TABS: 20.0000 mg | ORAL_TABLET | Freq: Every day | ORAL | Status: DC

## 2015-03-14 MED ORDER — ALBUTEROL SULFATE HFA 108 (90 BASE) MCG/ACT IN AERS: 2.0000 | INHALATION_SPRAY | Freq: Four times a day (QID) | RESPIRATORY_TRACT | Status: DC | PRN

## 2015-03-14 MED ORDER — INSULIN ASPART 100 UNIT/ML ~~LOC~~ SOLN
0.0000 [IU] | Freq: Every day | SUBCUTANEOUS | Status: DC
  Administered 2015-03-15 – 2015-03-17 (×2): 3 [IU] via SUBCUTANEOUS
  Administered 2015-03-18 – 2015-03-19 (×2): 2 [IU] via SUBCUTANEOUS
  Administered 2015-03-20 – 2015-03-22 (×3): 3 [IU] via SUBCUTANEOUS

## 2015-03-14 MED ORDER — FUROSEMIDE 10 MG/ML IJ SOLN
80.0000 mg | Freq: Once | INTRAMUSCULAR | Status: AC
  Administered 2015-03-14: 80 mg via INTRAVENOUS
  Filled 2015-03-14: qty 8

## 2015-03-14 MED ORDER — MOMETASONE FURO-FORMOTEROL FUM 100-5 MCG/ACT IN AERO
2.0000 | INHALATION_SPRAY | Freq: Two times a day (BID) | RESPIRATORY_TRACT | Status: DC
Start: 2015-03-14 — End: 2015-03-23
  Administered 2015-03-15 – 2015-03-23 (×14): 2 via RESPIRATORY_TRACT
  Filled 2015-03-14: qty 8.8

## 2015-03-14 MED ORDER — FUROSEMIDE 10 MG/ML IJ SOLN: 80.0000 mg | Freq: Two times a day (BID) | INTRAMUSCULAR | Status: DC

## 2015-03-14 MED ORDER — FERROUS SULFATE 325 (65 FE) MG PO TABS
325.0000 mg | ORAL_TABLET | Freq: Three times a day (TID) | ORAL | Status: DC
  Administered 2015-03-15 – 2015-03-19 (×15): 325 mg via ORAL
  Filled 2015-03-14 (×15): qty 1

## 2015-03-14 MED ORDER — DIGOXIN 125 MCG PO TABS
0.0625 mg | ORAL_TABLET | Freq: Every day | ORAL | Status: DC
  Administered 2015-03-15 – 2015-03-18 (×4): 0.0625 mg via ORAL
  Filled 2015-03-14 (×5): qty 1

## 2015-03-14 MED ORDER — ALBUTEROL SULFATE (2.5 MG/3ML) 0.083% IN NEBU
2.5000 mg | INHALATION_SOLUTION | Freq: Four times a day (QID) | RESPIRATORY_TRACT | Status: DC | PRN
  Filled 2015-03-14: qty 3

## 2015-03-14 MED ORDER — ATORVASTATIN CALCIUM 40 MG PO TABS
40.0000 mg | ORAL_TABLET | Freq: Every day | ORAL | Status: DC
Start: 2015-03-15 — End: 2015-03-23
  Administered 2015-03-15 – 2015-03-22 (×8): 40 mg via ORAL
  Filled 2015-03-14 (×8): qty 1

## 2015-03-14 NOTE — ED Notes (Signed)
Pt arrives via EMS from home with onset of chest pain at 1030 this morning, onset of SHOB tonight. Upon EMS arrival pt in AFIB rate in 150s. Given 20MG  cardizem and rate now 110s. Still in AFIB. Denies pain, endorses pressure. Ambulatory, alert x4.

## 2015-03-14 NOTE — H&P (Addendum)
RFA: decompensated heart failure  HPI: [name] with hx of HTN, HLD, DM2, CAD s/p 2V CABG w/ MAZE, ischemic cardiomyopathy EF 30% s/p [place] ICD ([employer]), long standing persistent atrial fibrillation, poorly rate-controlled (clinic HRs in December 130s and up), on xarelto, who presents in decompensated heart failure.  Per clinic note on 02/27/15 he had gained 10 pounds and his lasix was increased from 60 BID to 120 BID.  He states he missed his lasix dose this morning, but has had worsening HF over the past week, notably with increasing DOE (can only walk a few feet before SOB), orthopnea, and frequent PND.  He had one sustained episode of chest pressure that lasted hours this morning, but this has resolved and initial troponin is negative.  He denies any salty meals, and states other than missing lasix today, he has been adherent to all meds, including Xarelto (adamant he takes daily).  ROS: Denies fevers, changes in urination/GI symptoms, denies bleeding (reports of bright red blood per rectum)  PMHx: Past Medical History  Diagnosis Date  . Hypertension   . Noncompliance     homelessness contributing.   Marland Kitchen CAD (coronary artery disease) Sept 2013    s/p cardiac cath showing occlusion of small RCA with collaterals  . Chronic anticoagulation     on xarelto.   . Atrial fibrillation ([facility])     RVR 10/2014  . Peripheral arterial disease ([facility])   . Automatic implantable cardioverter-defibrillator in situ   . High cholesterol   . Myocardial infarction ([facility]) 2014  . Type II diabetes mellitus ([facility])   . CHF (congestive heart failure) ([facility])     20 to 25 % EF and RV dysfunction by 07/2014 echo    Medications: No current facility-administered medications on file prior to encounter.   Current Outpatient Prescriptions on File Prior to Encounter  Medication Sig Dispense Refill  . albuterol (PROVENTIL HFA;VENTOLIN HFA) 108 (90 BASE) MCG/ACT inhaler Inhale 2 puffs into the lungs every 6 (six)  hours as needed for wheezing or shortness of breath. 1 Inhaler 2  . albuterol (PROVENTIL) (2.5 MG/3ML) 0.083% nebulizer solution Take 3 mLs (2.5 mg total) by nebulization every 6 (six) hours as needed for wheezing or shortness of breath. 150 mL 1  . atorvastatin (LIPITOR) 40 MG tablet Take 1 tablet (40 mg total) by mouth daily at 6 PM. 30 tablet 2  . Digoxin 62.5 MCG TABS Take 0.0625 mg by mouth daily. 30 tablet 2  . docusate sodium (COLACE) 100 MG capsule Take 1 capsule (100 mg total) by mouth daily. 30 capsule 2  . ferrous sulfate 325 (65 FE) MG tablet Take 1 tablet (325 mg total) by mouth 3 (three) times daily with meals. (Patient taking differently: Take 650 mg by mouth daily with breakfast. ) 90 tablet 2  . Fluticasone-Salmeterol (ADVAIR) 100-50 MCG/DOSE AEPB Inhale 1 puff into the lungs 2 (two) times daily. 60 each 3  . furosemide (LASIX) 40 MG tablet Take 2 tablets (80 mg total) by mouth 2 (two) times daily. 60 tablet 2  . glipiZIDE (GLUCOTROL) 10 MG tablet Take 1 tablet (10 mg total) by mouth 2 (two) times daily before a meal. 60 tablet 2  . metoprolol succinate (TOPROL-XL) 25 MG 24 hr tablet Take 2 tablets (50 mg total) by mouth daily. 30 tablet 2  . pantoprazole (PROTONIX) 40 MG tablet Take 1 tablet (40 mg total) by mouth 2 (two) times daily. 60 tablet 2  . rivaroxaban (XARELTO) 20 MG TABS  tablet Take 1 tablet (20 mg total) by mouth daily before supper. 30 tablet 2  . spironolactone (ALDACTONE) 25 MG tablet Take 0.5 tablets (12.5 mg total) by mouth daily. 30 tablet 2  . traMADol (ULTRAM) 50 MG tablet Take 1 tablet (50 mg total) by mouth every 8 (eight) hours as needed for moderate pain. 60 tablet 1   Allergies: Allergies  Allergen Reactions  . Tape Itching    Paper tape please.   Fam Hx: Family History  Problem Relation Age of Onset  . Diabetes Mother    Soc Hx: - Lives by himself in public housing, gets free meds, +smoking, estranged from his family due to gambling  problems  Physical Exam BP 152/77 mmHg  Pulse 105  Temp(Src) 98.2 F (36.8 C)  Resp 20  Ht  (1.651 m)  SpO2 95% JVP to jaw sitting upright Tachycardic, irregular, soft SEM Decreased BS at bases Soft nt nd Bilateral pitting edema AAO without gross focal deficit  Labs: 8.0 >--------< 287           37  134  98  24 ----------------< 319 5.2   27  1.17  Trop .02 BNP elevated  CXR - severe cardiomegaly with bilateral pleural effusions and pulmonary venous congestion  ECG afib 104, LAFB, PVC  Echo 11/24/14 - EF 30%, mild MR  ICD: [employer] [place]   Impression/Plan [id number] with hx of ischemic cardiomyopathy, long-standing persistent afib, poorly controlled, on xarelto, admitted with decompensated heart failure in setting missed lasix and more likely from poor rate control  Plan # Decompensated HF - Home regimen is 80 po bid; convert to 80 IV BID - c/w digoxin - change to metop 25 qid - c/w aldactone 25  # Long-standing persistent AF, poor rate control - States he is adherent to xarelto  - No recent bleeds despite prior bright red blood --> Cardioversion is an option - However, his last documented sinus rhythm that I can find is 10/01/11; - prior attempt at rhythm control with amiodarone this summer was unsuccessful (see hospital notes 11/2014) - increased metoprolol as per above, c/w dig - avoid dilt for now given prior hospitalization for cardiogenic shock and his cardiomyopathy - admit to telemetery.  Ideally he should average HRs < 80  # diabetes - Change oral anti-glycemics to SS insulin  # otherwise continue home meds   Dossie Arbour, MD Cardiologist, [employer]  ====  Addendum: - HR still above 100s, will uptitrate metoprolol to 50 q6h - Poor response to 80IV lasix, only put out about 200 cc but feels much better ==> will uptitrate to 120IV this am.  He has decline a foley catheter and prefers to use the bathroom.

## 2015-03-14 NOTE — ED Provider Notes (Signed)
CSN: [id number]     Arrival date & time 03/14/15  1909 History   First MD Initiated Contact with Patient 03/14/15 1923     Chief Complaint  Patient presents with  . Atrial Fibrillation     (Consider location/radiation/quality/duration/timing/severity/associated sxs/prior Treatment) Patient is a 59 y.o. male presenting with shortness of breath. The history is provided by the patient.  Shortness of Breath Severity:  Moderate Onset quality:  Gradual Duration:  1 day Timing:  Constant Progression:  Worsening Chronicity:  Recurrent Context comment:  History of CHF, CAD, AFIB Relieved by:  Nothing Worsened by:  Activity Ineffective treatments:  Diuretics and rest Associated symptoms: chest pain   Associated symptoms: no abdominal pain, no claudication, no cough, no diaphoresis, no ear pain, no fever, no headaches, no hemoptysis, no neck pain, no rash, no sore throat, no sputum production, no syncope, no vomiting and no wheezing   Risk factors: obesity   Risk factors: no prolonged immobilization     Past Medical History  Diagnosis Date  . Hypertension   . Noncompliance     homelessness contributing.   Marland Kitchen CAD (coronary artery disease) Sept 2013    s/p cardiac cath showing occlusion of small RCA with collaterals  . Chronic anticoagulation     on xarelto.   . Atrial fibrillation (HCC)     RVR 10/2014  . Peripheral arterial disease (HCC)   . Automatic implantable cardioverter-defibrillator in situ   . High cholesterol   . Myocardial infarction (HCC) 2014  . Type II diabetes mellitus (HCC)   . CHF (congestive heart failure) (HCC)     20 to 25 % EF and RV dysfunction by 07/2014 echo    Past Surgical History  Procedure Laterality Date  . Implantable cardioverter defibrillator implant      [place] in 07/2012; [employer]  . Coronary artery bypass graft  09/2012    2 vessels per patient [name]   . Coronary angioplasty with stent placement  11/2011    "1"  . Cardiac  catheterization  09/2012  . Iliac artery stent Right 08/30/2013  . Left and right heart catheterization with coronary angiogram N/A 12/02/2011    Procedure: LEFT AND RIGHT HEART CATHETERIZATION WITH CORONARY ANGIOGRAM;  Surgeon: Kathleene Hazel, MD;  Location: [facility] CATH LAB;  Service: Cardiovascular;  Laterality: N/A;  . Lower extremity angiogram N/A 08/30/2013    Procedure: LOWER EXTREMITY ANGIOGRAM;  Surgeon: Runell Gess, MD;  Location: [facility] CATH LAB;  Service: Cardiovascular;  Laterality: N/A;  . Lower extremity angiogram N/A 12/02/2013    Procedure: LOWER EXTREMITY ANGIOGRAM;  Surgeon: Runell Gess, MD;  Location: [facility] CATH LAB;  Service: Cardiovascular;  Laterality: N/A;  . Esophagogastroduodenoscopy N/A 11/28/2014    Procedure: ESOPHAGOGASTRODUODENOSCOPY (EGD);  Surgeon: Beverley Fiedler, MD;  Location: [facility] ENDOSCOPY;  Service: Endoscopy;  Laterality: N/A;   Family History  Problem Relation Age of Onset  . Diabetes Mother    Social History  Substance Use Topics  . Smoking status: Current Every Day Smoker -- 0.50 packs/day for 38 years    Types: Cigarettes  . Smokeless tobacco: Never Used  . Alcohol Use: No    Review of Systems  Constitutional: Negative for fever and diaphoresis.  HENT: Negative for ear pain and sore throat.   Eyes: Negative for pain.  Respiratory: Positive for chest tightness and shortness of breath. Negative for cough, hemoptysis, sputum production and wheezing.   Cardiovascular: Positive for chest pain. Negative for claudication and syncope.  Gastrointestinal: Positive for nausea. Negative for vomiting and abdominal pain.  Genitourinary: Negative for dysuria.  Musculoskeletal: Negative for back pain and neck pain.  Skin: Negative for rash.  Neurological: Negative for dizziness and headaches.      Allergies  Tape  Home Medications   Prior to Admission medications   Medication Sig Start Date End Date Taking? Authorizing Provider  albuterol (PROVENTIL  HFA;VENTOLIN HFA) 108 (90 BASE) MCG/ACT inhaler Inhale 2 puffs into the lungs every 6 (six) hours as needed for wheezing or shortness of breath. 12/22/14  Yes Jaclyn Shaggy, MD  albuterol (PROVENTIL) (2.5 MG/3ML) 0.083% nebulizer solution Take 3 mLs (2.5 mg total) by nebulization every 6 (six) hours as needed for wheezing or shortness of breath. 09/06/14  Yes Jaclyn Shaggy, MD  atorvastatin (LIPITOR) 40 MG tablet Take 1 tablet (40 mg total) by mouth daily at 6 PM. 01/30/15  Yes Jaclyn Shaggy, MD  Digoxin 62.5 MCG TABS Take 0.0625 mg by mouth daily. 01/30/15  Yes Jaclyn Shaggy, MD  docusate sodium (COLACE) 100 MG capsule Take 1 capsule (100 mg total) by mouth daily. 01/30/15  Yes Jaclyn Shaggy, MD  ferrous sulfate 325 (65 FE) MG tablet Take 1 tablet (325 mg total) by mouth 3 (three) times daily with meals. Patient taking differently: Take 650 mg by mouth daily with breakfast.  12/22/14  Yes Jaclyn Shaggy, MD  Fluticasone-Salmeterol (ADVAIR) 100-50 MCG/DOSE AEPB Inhale 1 puff into the lungs 2 (two) times daily. 01/30/15  Yes Jaclyn Shaggy, MD  furosemide (LASIX) 40 MG tablet Take 2 tablets (80 mg total) by mouth 2 (two) times daily. 02/27/15  Yes Jaclyn Shaggy, MD  glipiZIDE (GLUCOTROL) 10 MG tablet Take 1 tablet (10 mg total) by mouth 2 (two) times daily before a meal. 01/30/15  Yes Jaclyn Shaggy, MD  metoprolol succinate (TOPROL-XL) 25 MG 24 hr tablet Take 2 tablets (50 mg total) by mouth daily. 02/27/15  Yes Jaclyn Shaggy, MD  pantoprazole (PROTONIX) 40 MG tablet Take 1 tablet (40 mg total) by mouth 2 (two) times daily. 01/30/15  Yes Jaclyn Shaggy, MD  rivaroxaban (XARELTO) 20 MG TABS tablet Take 1 tablet (20 mg total) by mouth daily before supper. 01/30/15  Yes Jaclyn Shaggy, MD  spironolactone (ALDACTONE) 25 MG tablet Take 0.5 tablets (12.5 mg total) by mouth daily. 01/30/15  Yes Jaclyn Shaggy, MD  traMADol (ULTRAM) 50 MG tablet Take 1 tablet (50 mg total) by mouth every 8 (eight) hours as needed for moderate pain. 01/30/15   Yes Jaclyn Shaggy, MD   BP 136/84 mmHg  Pulse 105  Temp(Src) 98.2 F (36.8 C)  Resp 20  Ht  (1.651 m)  Wt 106.5 kg  BMI 39.07 kg/m2  SpO2 94% Physical Exam  Constitutional: He appears well-developed and well-nourished. He has a sickly appearance. He appears ill.  HENT:  Head: Head is without abrasion, without contusion and without laceration.  Eyes: EOM are normal. Pupils are equal, round, and reactive to light.  Neck: Normal range of motion.  Cardiovascular: An irregularly irregular rhythm present. Tachycardia present.  Exam reveals no decreased pulses.   Pulmonary/Chest: He has rhonchi in the right lower field and the left lower field.  Abdominal: Normal appearance. He exhibits no distension and no mass. There is no tenderness. There is no rigidity, no guarding, no CVA tenderness, no tenderness at McBurney's point and negative Murphy's sign. No hernia.  Neurological: He is alert. He is not disoriented. No cranial nerve deficit or sensory deficit. GCS eye subscore  is 4. GCS verbal subscore is 5. GCS motor subscore is 6.  Skin: No rash noted.  Psychiatric: He has a normal mood and affect. His speech is rapid and/or pressured.    ED Course  Procedures (including critical care time) Labs Review Labs Reviewed  BASIC METABOLIC PANEL - Abnormal; Notable for the following:    Sodium 134 (*)    Potassium 5.2 (*)    Chloride 98 (*)    Glucose, Bld 319 (*)    BUN 24 (*)    Calcium 8.6 (*)    All other components within normal limits  CBC - Abnormal; Notable for the following:    Hemoglobin 9.7 (*)    HCT 37.4 (*)    MCV 70.7 (*)    MCH 18.3 (*)    MCHC 25.9 (*)    RDW 23.3 (*)    All other components within normal limits  BRAIN NATRIURETIC PEPTIDE - Abnormal; Notable for the following:    B Natriuretic Peptide 746.2 (*)    All other components within normal limits  DIGOXIN LEVEL - Abnormal; Notable for the following:    Digoxin Level 0.6 (*)    All other components within  normal limits  PROTIME-INR - Abnormal; Notable for the following:    Prothrombin Time 19.0 (*)    INR 1.59 (*)    All other components within normal limits  GLUCOSE, CAPILLARY - Abnormal; Notable for the following:    Glucose-Capillary 276 (*)    All other components within normal limits  BASIC METABOLIC PANEL  CBC  MAGNESIUM  HEMOGLOBIN A1C  I-STAT TROPOININ, ED    Imaging Review Dg Chest 2 View  03/14/2015  CLINICAL DATA:  Chest pain for 1 day EXAM: CHEST - 2 VIEW COMPARISON:  10/19/6 pain FINDINGS: Cardiac shadow is prominent. Postsurgical changes are again seen. A defibrillator is again noted. New right basilar infiltrate with associated effusion is seen. Mild vascular congestion is noted as well. IMPRESSION: Right basilar infiltrate with associated effusion as well as mild vascular congestion. Electronically Signed   By: Alcide Clever M.D.   On: 03/14/2015 20:34   I have personally reviewed and evaluated these images and lab results as part of my medical decision-making.   EKG Interpretation None      MDM   Final diagnoses:  Atrial fibrillation with RVR (HCC)  SOB (shortness of breath)    59 year old gentleman with past medical history of atrial fibrillation on Xarelto, CAD, CHF with EF of 30% and presents in the setting of shortness of breath. Patient reports shortness of breath has been gradually worsening throughout the day and he called EMS. He additionally reports some mild retrosternal chest pain during this time. On arrival of EMS the patient was found to have heart rate in the 140s to 150s and atrial fibrillation. Patient given IV Cardizem in route to the emergency department. During that time are a decrease in the 90s and 100s and patient reported chest pain improved. He does continue to endorse shortness of breath. On arrival patient was found to be to, tachycardic with oxygen saturations in the low 90s on room air. Patient started on supplemental oxygen. On  examination patient had lower extremity swelling and crackles in bilateral lower lobes. Patient with signs of fluid overload status.  Settings findings differential diagnosis includes ACS, CHF exacerbation, A. fib with RVR. We'll give patient IV diuretics and we will obtain troponin, EKG, chest x-ray, CBC, BMP will evaluate patient after results.  Chest x-ray which I personally reviewed reveals signs of pulmonary congestion. No significant elevation in troponin. Lab for analysis consistent with fluid overload status. In setting of these findings will admit to cardiology this time for further management of likely complication of A. fib with RVR versus CHF exacerbation. Patient stable at time of admission to cardiology.  Attending has seen and evaluated patient and Dr. Denton Lank is in agreement with plan.    Stacy Gardner, MD 03/15/15 [id number]  Cathren Laine, MD 03/20/15 [phone]

## 2015-03-15 ENCOUNTER — Encounter (HOSPITAL_COMMUNITY): Payer: Self-pay | Admitting: General Practice

## 2015-03-15 DIAGNOSIS — R0602 Shortness of breath: Secondary | ICD-10-CM | POA: Insufficient documentation

## 2015-03-15 DIAGNOSIS — I4891 Unspecified atrial fibrillation: Secondary | ICD-10-CM

## 2015-03-15 LAB — BASIC METABOLIC PANEL
ANION GAP: 10 (ref 5–15)
BUN: 25 mg/dL — ABNORMAL HIGH (ref 6–20)
CALCIUM: 8.9 mg/dL (ref 8.9–10.3)
CO2: 29 mmol/L (ref 22–32)
CREATININE: 1.25 mg/dL — AB (ref 0.61–1.24)
Chloride: 95 mmol/L — ABNORMAL LOW (ref 101–111)
Glucose, Bld: 188 mg/dL — ABNORMAL HIGH (ref 65–99)
Potassium: 4.3 mmol/L (ref 3.5–5.1)
SODIUM: 134 mmol/L — AB (ref 135–145)

## 2015-03-15 LAB — CBC
HCT: 38 % — ABNORMAL LOW (ref 39.0–52.0)
HEMOGLOBIN: 9.8 g/dL — AB (ref 13.0–17.0)
MCH: 18.3 pg — AB (ref 26.0–34.0)
MCHC: 25.8 g/dL — ABNORMAL LOW (ref 30.0–36.0)
MCV: 71 fL — ABNORMAL LOW (ref 78.0–100.0)
PLATELETS: 255 10*3/uL (ref 150–400)
RBC: 5.35 MIL/uL (ref 4.22–5.81)
RDW: 23.1 % — ABNORMAL HIGH (ref 11.5–15.5)
WBC: 8.8 10*3/uL (ref 4.0–10.5)

## 2015-03-15 LAB — GLUCOSE, CAPILLARY
GLUCOSE-CAPILLARY: 161 mg/dL — AB (ref 65–99)
GLUCOSE-CAPILLARY: 164 mg/dL — AB (ref 65–99)
GLUCOSE-CAPILLARY: 187 mg/dL — AB (ref 65–99)
GLUCOSE-CAPILLARY: 276 mg/dL — AB (ref 65–99)
Glucose-Capillary: 127 mg/dL — ABNORMAL HIGH (ref 65–99)
Glucose-Capillary: 246 mg/dL — ABNORMAL HIGH (ref 65–99)

## 2015-03-15 MED ORDER — METOPROLOL TARTRATE 50 MG PO TABS
50.0000 mg | ORAL_TABLET | Freq: Four times a day (QID) | ORAL | Status: DC
  Administered 2015-03-15 – 2015-03-16 (×6): 50 mg via ORAL
  Filled 2015-03-15 (×7): qty 1

## 2015-03-15 MED ORDER — FUROSEMIDE 10 MG/ML IJ SOLN
120.0000 mg | Freq: Two times a day (BID) | INTRAVENOUS | Status: DC
  Administered 2015-03-15 – 2015-03-18 (×7): 120 mg via INTRAVENOUS
  Filled 2015-03-15 (×9): qty 12

## 2015-03-15 MED ORDER — DEXTROSE 5 % IV SOLN
1.0000 g | INTRAVENOUS | Status: DC
  Administered 2015-03-15 – 2015-03-18 (×4): 1 g via INTRAVENOUS
  Filled 2015-03-15 (×5): qty 10

## 2015-03-15 MED ORDER — DOXYCYCLINE HYCLATE 100 MG IV SOLR
100.0000 mg | Freq: Two times a day (BID) | INTRAVENOUS | Status: DC
  Administered 2015-03-15 – 2015-03-18 (×8): 100 mg via INTRAVENOUS
  Filled 2015-03-15 (×9): qty 100

## 2015-03-15 MED ORDER — ENSURE ENLIVE PO LIQD
237.0000 mL | Freq: Two times a day (BID) | ORAL | Status: DC
  Administered 2015-03-15 – 2015-03-23 (×6): 237 mL via ORAL

## 2015-03-15 MED ORDER — LEVOFLOXACIN IN D5W 750 MG/150ML IV SOLN
750.0000 mg | INTRAVENOUS | Status: DC
  Administered 2015-03-15: 750 mg via INTRAVENOUS
  Filled 2015-03-15: qty 150

## 2015-03-15 MED ORDER — METOLAZONE 5 MG PO TABS
5.0000 mg | ORAL_TABLET | Freq: Every day | ORAL | Status: DC
  Administered 2015-03-15 – 2015-03-19 (×5): 5 mg via ORAL
  Filled 2015-03-15 (×5): qty 1

## 2015-03-15 NOTE — Progress Notes (Signed)
ANTIBIOTIC CONSULT NOTE - INITIAL  Pharmacy Consult for levaquin Indication: rule out pneumonia  Allergies  Allergen Reactions  . Tape Itching    Paper tape please.    Patient Measurements: Height: 5\' 5"  (165.1 cm) Weight: 234 lb 12.6 oz (106.5 kg) IBW/kg (Calculated) : 61.5 Adjusted Body Weight:   Vital Signs: Temp: 98.2 F (36.8 C) (12/21 0448) Temp Source: Oral (12/21 0448) BP: 98/71 mmHg (12/21 0448) Pulse Rate: 95 (12/21 0448) Intake/Output from previous day: 12/20 0701 - 12/21 0700 In: -  Out: 100 [Urine:100] Intake/Output from this shift:    Labs:  Recent Labs  03/14/15 1920 03/15/15 0440  WBC 8.0 8.8  HGB 9.7* 9.8*  PLT 287 255  CREATININE 1.17 1.25*   Estimated Creatinine Clearance: 72.4 mL/min (by C-G formula based on Cr of 1.25). No results for input(s): VANCOTROUGH, VANCOPEAK, VANCORANDOM, GENTTROUGH, GENTPEAK, GENTRANDOM, TOBRATROUGH, TOBRAPEAK, TOBRARND, AMIKACINPEAK, AMIKACINTROU, AMIKACIN in the last 72 hours.   Microbiology: No results found for this or any previous visit (from the past 720 hour(s)).  Medical History: Past Medical History  Diagnosis Date  . Hypertension   . Noncompliance     homelessness contributing.   Marland Kitchen CAD (coronary artery disease) Sept 2013    s/p cardiac cath showing occlusion of small RCA with collaterals  . Chronic anticoagulation     on xarelto.   . Atrial fibrillation (HCC)     RVR 10/2014  . Peripheral arterial disease (HCC)   . Automatic implantable cardioverter-defibrillator in situ   . High cholesterol   . Myocardial infarction (HCC) 2014  . CHF (congestive heart failure) (HCC)     20 to 25 % EF and RV dysfunction by 07/2014 echo   . Type II diabetes mellitus (HCC)     Medications:  Anti-infectives    Start     Dose/Rate Route Frequency Ordered Stop   03/15/15 0900  levofloxacin (LEVAQUIN) IVPB 750 mg     750 mg 100 mL/hr over 90 Minutes Intravenous Every 24 hours 03/15/15 0831        Assessment: 59 yom presented to the hospital wth afib. To start empiric levaquin for possible CAP. Pt is afebrile and WBC is WNL. Pt has good renal function. No cultures done.   Levaquin 12/21>>  Goal of Therapy:  Eradication of infection  Plan:  - Levaquin 750mg  IV Q24H - F/u renal fxn, C&S, clinical status  *Pharmacy will sign-off and follow peripherally. Thank you for the consult!  Lysle Pearl, PharmD, BCPS Pager # [phone] 03/15/2015 8:33 AM

## 2015-03-15 NOTE — Progress Notes (Addendum)
Patient Profile: 70yoM with hx of ischemic cardiomyopathy, long-standing persistent afib, poorly controlled, on xarelto, admitted with decompensated heart failure in setting missed lasix and more likely from poor rate control   Subjective: Little improvement since yesterday. Still short of breath. He notes little urine output since he was given lasix. No chest pain.   Objective: Vital signs in last 24 hours: Temp:  [98.2 F (36.8 C)] 98.2 F (36.8 C) (12/21 0448) Pulse Rate:  [95-114] 95 (12/21 0448) Resp:  [14-28] 20 (12/21 0448) BP: (98-152)/(71-96) 98/71 mmHg (12/21 0448) SpO2:  [92 %-100 %] 99 % (12/21 0902) Weight:  [234 lb 12.6 oz (106.5 kg)] 234 lb 12.6 oz (106.5 kg) (12/20 2346)    Intake/Output from previous day: 12/20 0701 - 12/21 0700 In: -  Out: 100 [Urine:100] Intake/Output this shift: Total I/O In: 120 [P.O.:120] Out: -   Medications Current Facility-Administered Medications  Medication Dose Route Frequency Provider Last Rate Last Dose  . albuterol (PROVENTIL) (2.5 MG/3ML) 0.083% nebulizer solution 2.5 mg  2.5 mg Nebulization Q6H PRN Dossie Arbour, MD      . atorvastatin (LIPITOR) tablet 40 mg  40 mg Oral q1800 Dossie Arbour, MD      . cefTRIAXone (ROCEPHIN) 1 g in dextrose 5 % 50 mL IVPB  1 g Intravenous Q24H Richarda Overlie, MD   1 g at 03/15/15 1148  . digoxin (LANOXIN) tablet 0.0625 mg  0.0625 mg Oral Daily Dossie Arbour, MD   0.0625 mg at 03/15/15 1028  . docusate sodium (COLACE) capsule 100 mg  100 mg Oral Daily Dossie Arbour, MD   100 mg at 03/15/15 0851  . doxycycline (VIBRAMYCIN) 100 mg in dextrose 5 % 250 mL IVPB  100 mg Intravenous Q12H Richarda Overlie, MD      . feeding supplement (ENSURE ENLIVE) (ENSURE ENLIVE) liquid 237 mL  237 mL Oral BID BM Dossie Arbour, MD   237 mL at 03/15/15 0851  . ferrous sulfate tablet 325 mg  325 mg Oral TID WC Dossie Arbour, MD   325 mg at 03/15/15 0549  . furosemide (LASIX) 120 mg in dextrose 5 % 50 mL IVPB  120 mg Intravenous BID Dossie Arbour, MD   120 mg at 03/15/15 1028  . insulin aspart (novoLOG) injection 0-15 Units  0-15 Units Subcutaneous TID WC Dossie Arbour, MD   3 Units at 03/15/15 [phone]  . insulin aspart (novoLOG) injection 0-5 Units  0-5 Units Subcutaneous QHS Dossie Arbour, MD   3 Units at 03/15/15 0013  . metoprolol (LOPRESSOR) tablet 50 mg  50 mg Oral Q6H Dossie Arbour, MD   50 mg at 03/15/15 1149  . mometasone-formoterol (DULERA) 100-5 MCG/ACT inhaler 2 puff  2 puff Inhalation BID Dossie Arbour, MD   2 puff at 03/15/15 0901  . pantoprazole (PROTONIX) EC tablet 40 mg  40 mg Oral BID Dossie Arbour, MD   40 mg at 03/15/15 0850  . rivaroxaban (XARELTO) tablet 20 mg  20 mg Oral Daily Dossie Arbour, MD   20 mg at 03/15/15 0850  . spironolactone (ALDACTONE) tablet 12.5 mg  12.5 mg Oral Daily Dossie Arbour, MD   12.5 mg at 03/15/15 0851  . traMADol (ULTRAM) tablet 50 mg  50 mg Oral Q8H PRN Dossie Arbour, MD   50 mg at 03/15/15 0231    PE: General appearance: alert, cooperative and no distress Neck: no carotid bruit and no JVD Lungs: clear to auscultation bilaterally Heart: irregularly irregular rhythm and regular rate Extremities:  tense bilateral LEE Pulses: 2+ and symmetric Skin: warm and dry Neurologic: Grossly normal  Lab Results:   Recent Labs  03/14/15 1920 03/15/15 0440  WBC 8.0 8.8  HGB 9.7* 9.8*  HCT 37.4* 38.0*  PLT 287 255   BMET  Recent Labs  03/14/15 1920 03/15/15 0440  NA 134* 134*  K 5.2* 4.3  CL 98* 95*  CO2 27 29  GLUCOSE 319* 188*  BUN 24* 25*  CREATININE 1.17 1.25*  CALCIUM 8.6* 8.9   PT/INR  Recent Labs  03/14/15 1920  LABPROT 19.0*  INR 1.59*     Assessment/Plan  Active Problems:   Atrial fibrillation with RVR (HCC)   1. Acute on Chronic CHF (EF 30%) - Home regimen is lasix 80 po bid; converted to 120 IV BID on admit with little UOP. SCr 1.25. BUN 25. Continue to monitor closely. If continued poor UOP, ? Adding metolazone 30 min prior to lasix.  - c/w digoxin, metoprolol and aldactone     2. Long-standing persistent AF - HR is better controlled in the 90s.  - continue metoprolol and digoxin - avoid use of diltiazem given his LV dysfunction.  - States he is adherent to xarelto  - No recent bleeds despite prior bright red blood --> Cardioversion is an option - prior attempt at rhythm control with amiodarone this summer was unsuccessful (see hospital notes 11/2014)  3.  Diabetes - Change oral anti-glycemics to SS insulin  4. CAD: h/o CABG. Denies CP.     LOS: 1 day    Brittainy M. Delmer Islam 03/15/2015 11:51 AM  Personally seen and examined. Agree with above. Will add metolazone  QD Watch BMET Rate control improved.  Needs further diuresis. Has not urinated well thus far.   ?antibiotics - per primary team  Donato Schultz, MD

## 2015-03-15 NOTE — Progress Notes (Signed)
Nutrition Brief Note  Patient identified on the Malnutrition Screening Tool (MST) Report.  Wt Readings from Last 15 Encounters:  03/14/15 234 lb 12.6 oz (106.5 kg)  02/27/15 236 lb (107.049 kg)  01/30/15 226 lb (102.513 kg)  01/11/15 214 lb 2 oz (97.126 kg)  01/09/15 232 lb (105.235 kg)  12/22/14 222 lb 6.4 oz (100.88 kg)  12/20/14 217 lb (98.431 kg)  12/12/14 218 lb (98.884 kg)  12/05/14 203 lb 14.4 oz (92.488 kg)  11/14/14 212 lb 12.8 oz (96.525 kg)  11/02/14 216 lb 9.6 oz (98.249 kg)  10/31/14 218 lb 12.8 oz (99.247 kg)  10/19/14 221 lb 3.2 oz (100.336 kg)  10/13/14 216 lb 9.6 oz (98.249 kg)  10/04/14 232 lb (105.235 kg)    Body mass index is 39.07 kg/(m^2). Patient meets criteria for Obesity Class II based on current BMI.   Current diet order is Carbohydrate Modified. Labs and medications reviewed.   No nutrition interventions warranted at this time. If nutrition issues arise, please consult RD.   Maureen Chatters, RD, LDN Pager #: [phone] After-Hours Pager #: [phone]

## 2015-03-15 NOTE — Progress Notes (Signed)
[facility] PROGRESS NOTE  Jacob [name] ERD:[id number] DOB: 1955/06/17 DOA: 03/14/2015 PCP: [name], MD  Length of stay: 1   Assessment/Plan: Active Problems:   Atrial fibrillation with RVR (HCC)    # Acute on chronic systolic heart failure Continue Lasix 120 IV BID, cardiology consultation pending Continue digoxin, metoprolol, Aldactone,  last admission 9/16, and patient required CVP monitoring, was found to be in cardiogenic shock and required vasopressors and milrinone   # Long-standing persistent AF, poor rate control - Continue xarelto ,CHADSVASC2 of 4 - No recent bleeds despite prior bright red blood --> Cardioversion is an option - However, his last documented sinus rhythm that I can find is 10/01/11; - prior attempt at rhythm control with amiodarone this summer was unsuccessful (see hospital notes 11/2014) - increased metoprolol as per above, c/w dig - avoid dilt for now given prior hospitalization for cardiogenic shock and his cardiomyopathy Cardiology to make further recommendations, I have notified cardiology again today to come and see the patient Xarelto was held in setting of GI bleed and restarted on September 8 Patient also has an ICD and needs interrogation  3. Diabetes mellitus type II: His Hbg A1c in August 2016 is 8.1 Continue SSI Hold glipizide  4.COPD, stable, question right basilar infiltrate, started patient on Rocephin and doxycycline,  DVT prophylaxsis Xarelto  Code Status:    Family Communication: Discussed in detail with the patient, all imaging results, lab results explained to the patient   Disposition Plan:  Cardiology consultation pending    Brief narrative: [name] with hx of HTN, HLD, DM2, CAD s/p 2V CABG w/ MAZE, ischemic cardiomyopathy EF 30% s/p Lucas ICD ([employer]), long standing persistent atrial fibrillation, poorly rate-controlled (clinic HRs in December 130s and up), on xarelto, who presents in  decompensated heart failure.  Per clinic note on 02/27/15 he had gained 10 pounds and his lasix was increased from 60 BID to 120 BID.  He states he missed his lasix dose this morning, but has had worsening HF over the past week, notably with increasing DOE (can only walk a few feet before SOB), orthopnea, and frequent PND.  He had one sustained episode of chest pressure that lasted hours this morning, but this has resolved and initial troponin is negative. He denies any salty meals, and states other than missing lasix today, he has been adherent to all meds, including Xarelto (adamant he takes daily).   Consultants:  Cardiology  Procedures:  None  Antibiotics: Anti-infectives    Start     Dose/Rate Route Frequency Ordered Stop   03/15/15 0900  levofloxacin (LEVAQUIN) IVPB 750 mg     750 mg 100 mL/hr over 90 Minutes Intravenous Every 24 hours 03/15/15 0831           HPI/Subjective: Continues to have persistent shortness of breath, blood pressure soft, however shortness of breath is better than yesterday  Objective: Filed Vitals:   03/15/15 0108 03/15/15 0133 03/15/15 0448 03/15/15 0902  BP:   98/71   Pulse:   95   Temp:   98.2 F (36.8 C)   TempSrc:   Oral   Resp:   20   Height:      Weight:      SpO2: 100% 97% 92% 99%    Intake/Output Summary (Last 24 hours) at 03/15/15 1047 Last data filed at 03/14/15 2221  Gross per 24 hour  Intake      0 ml  Output  100 ml  Net   -100 ml    Exam:  General: No acute respiratory distress Lungs: Clear to auscultation bilaterally without wheezes or crackles Cardiovascular: Regular rate and rhythm without murmur gallop or rub normal S1 and S2 Abdomen: Nontender, nondistended, soft, bowel sounds positive, no rebound, no ascites, no appreciable mass Extremities: Bilateral pitting edema     Data Review   Micro Results No results found for this or any previous visit (from the past 240 hour(s)).  Radiology Reports Dg  Chest 2 View  03/14/2015  CLINICAL DATA:  Chest pain for 1 day EXAM: CHEST - 2 VIEW COMPARISON:  10/19/6 pain FINDINGS: Cardiac shadow is prominent. Postsurgical changes are again seen. A defibrillator is again noted. New right basilar infiltrate with associated effusion is seen. Mild vascular congestion is noted as well. IMPRESSION: Right basilar infiltrate with associated effusion as well as mild vascular congestion. Electronically Signed   By: Alcide Clever M.D.   On: 03/14/2015 20:34     CBC  Recent Labs Lab 03/14/15 1920 03/15/15 0440  WBC 8.0 8.8  HGB 9.7* 9.8*  HCT 37.4* 38.0*  PLT 287 255  MCV 70.7* 71.0*  MCH 18.3* 18.3*  MCHC 25.9* 25.8*  RDW 23.3* 23.1*    Chemistries   Recent Labs Lab 03/14/15 1920 03/15/15 0440  NA 134* 134*  K 5.2* 4.3  CL 98* 95*  CO2 27 29  GLUCOSE 319* 188*  BUN 24* 25*  CREATININE 1.17 1.25*  CALCIUM 8.6* 8.9   ------------------------------------------------------------------------------------------------------------------ estimated creatinine clearance is 72.4 mL/min (by C-G formula based on Cr of 1.25). ------------------------------------------------------------------------------------------------------------------ No results for input(s): HGBA1C in the last 72 hours. ------------------------------------------------------------------------------------------------------------------ No results for input(s): CHOL, HDL, LDLCALC, TRIG, CHOLHDL, LDLDIRECT in the last 72 hours. ------------------------------------------------------------------------------------------------------------------ No results for input(s): TSH, T4TOTAL, T3FREE, THYROIDAB in the last 72 hours.  Invalid input(s): FREET3 ------------------------------------------------------------------------------------------------------------------ No results for input(s): VITAMINB12, FOLATE, FERRITIN, TIBC, IRON, RETICCTPCT in the last 72 hours.  Coagulation profile  Recent  Labs Lab 03/14/15 1920  INR 1.59*    No results for input(s): DDIMER in the last 72 hours.  Cardiac Enzymes No results for input(s): CKMB, TROPONINI, MYOGLOBIN in the last 168 hours.  Invalid input(s): CK ------------------------------------------------------------------------------------------------------------------ Invalid input(s): POCBNP   CBG:  Recent Labs Lab 03/15/15 0009 03/15/15 0651  GLUCAP 276* 161*       Studies: Dg Chest 2 View  03/14/2015  CLINICAL DATA:  Chest pain for 1 day EXAM: CHEST - 2 VIEW COMPARISON:  10/19/6 pain FINDINGS: Cardiac shadow is prominent. Postsurgical changes are again seen. A defibrillator is again noted. New right basilar infiltrate with associated effusion is seen. Mild vascular congestion is noted as well. IMPRESSION: Right basilar infiltrate with associated effusion as well as mild vascular congestion. Electronically Signed   By: Alcide Clever M.D.   On: 03/14/2015 20:34      Lab Results  Component Value Date   HGBA1C 6.9 12/22/2014   HGBA1C 8.1* 11/12/2014   HGBA1C 8.4* 08/03/2014   Lab Results  Component Value Date   LDLCALC 52 08/05/2014   CREATININE 1.25* 03/15/2015       Scheduled Meds: . atorvastatin  40 mg Oral q1800  . digoxin  0.0625 mg Oral Daily  . docusate sodium  100 mg Oral Daily  . feeding supplement (ENSURE ENLIVE)  237 mL Oral BID BM  . ferrous sulfate  325 mg Oral TID WC  . furosemide  120 mg Intravenous BID  . insulin aspart  0-15 Units Subcutaneous TID WC  . insulin aspart  0-5 Units Subcutaneous QHS  . levofloxacin (LEVAQUIN) IV  750 mg Intravenous Q24H  . metoprolol tartrate  50 mg Oral Q6H  . mometasone-formoterol  2 puff Inhalation BID  . pantoprazole  40 mg Oral BID  . rivaroxaban  20 mg Oral Daily  . spironolactone  12.5 mg Oral Daily   Continuous Infusions:   Active Problems:   Atrial fibrillation with RVR (HCC)    Time spent: 45 minutes   [facility]  [facility] Pager [phone]. If 7PM-7AM, please contact night-coverage at [email or web address], password [facility] 03/15/2015, 10:47 AM  LOS: 1 day

## 2015-03-15 NOTE — Care Management Note (Signed)
Case Management Note Donn Pierini RN, BSN Unit 2W-Case Manager [phone]  Patient Details  Name: Emmons Laton MRN: [id number] Date of Birth: [birth year]  Subjective/Objective:   Pt admitted with afib/CHF                 Action/Plan: PTA Pt lived at home- anticipate return home- CM to follow for d/c needs  Expected Discharge Date:                  Expected Discharge Plan:  Home/Self Care  In-House Referral:     Discharge planning Services  CM Consult  Post Acute Care Choice:    Choice offered to:     DME Arranged:    DME Agency:     HH Arranged:    HH Agency:     Status of Service:  In process, will continue to follow  Medicare Important Message Given:    Date Medicare IM Given:    Medicare IM give by:    Date Additional Medicare IM Given:    Additional Medicare Important Message give by:     If discussed at Long Length of Stay Meetings, dates discussed:    Additional Comments:  Darrold Span, RN 03/15/2015, 10:27 AM

## 2015-03-15 NOTE — Progress Notes (Signed)
Utilization review completed.  

## 2015-03-16 ENCOUNTER — Inpatient Hospital Stay (HOSPITAL_COMMUNITY): Payer: Medicare Other

## 2015-03-16 DIAGNOSIS — I5023 Acute on chronic systolic (congestive) heart failure: Secondary | ICD-10-CM

## 2015-03-16 DIAGNOSIS — I1310 Hypertensive heart and chronic kidney disease without heart failure, with stage 1 through stage 4 chronic kidney disease, or unspecified chronic kidney disease: Secondary | ICD-10-CM | POA: Insufficient documentation

## 2015-03-16 DIAGNOSIS — R570 Cardiogenic shock: Secondary | ICD-10-CM

## 2015-03-16 LAB — COMPREHENSIVE METABOLIC PANEL
ALK PHOS: 152 U/L — AB (ref 38–126)
ALT: 15 U/L — AB (ref 17–63)
ANION GAP: 11 (ref 5–15)
AST: 23 U/L (ref 15–41)
Albumin: 2.8 g/dL — ABNORMAL LOW (ref 3.5–5.0)
BUN: 33 mg/dL — ABNORMAL HIGH (ref 6–20)
CALCIUM: 8.6 mg/dL — AB (ref 8.9–10.3)
CHLORIDE: 91 mmol/L — AB (ref 101–111)
CO2: 28 mmol/L (ref 22–32)
CREATININE: 1.68 mg/dL — AB (ref 0.61–1.24)
GFR, EST AFRICAN AMERICAN: 50 mL/min — AB (ref >60)
GFR, EST NON AFRICAN AMERICAN: 43 mL/min — AB (ref >60)
Glucose, Bld: 170 mg/dL — ABNORMAL HIGH (ref 65–99)
Potassium: 5.4 mmol/L — ABNORMAL HIGH (ref 3.5–5.1)
Sodium: 130 mmol/L — ABNORMAL LOW (ref 135–145)
Total Bilirubin: 2 mg/dL — ABNORMAL HIGH (ref 0.3–1.2)
Total Protein: 6.7 g/dL (ref 6.5–8.1)

## 2015-03-16 LAB — CBC
HCT: 39 % (ref 39.0–52.0)
Hemoglobin: 10.5 g/dL — ABNORMAL LOW (ref 13.0–17.0)
MCH: 18.8 pg — AB (ref 26.0–34.0)
MCHC: 26.9 g/dL — AB (ref 30.0–36.0)
MCV: 69.9 fL — AB (ref 78.0–100.0)
PLATELETS: 308 10*3/uL (ref 150–400)
RBC: 5.58 MIL/uL (ref 4.22–5.81)
RDW: 23.6 % — ABNORMAL HIGH (ref 11.5–15.5)
WBC: 9.2 10*3/uL (ref 4.0–10.5)

## 2015-03-16 LAB — GLUCOSE, CAPILLARY
GLUCOSE-CAPILLARY: 139 mg/dL — AB (ref 65–99)
GLUCOSE-CAPILLARY: 132 mg/dL — AB (ref 65–99)
GLUCOSE-CAPILLARY: 195 mg/dL — AB (ref 65–99)
Glucose-Capillary: 219 mg/dL — ABNORMAL HIGH (ref 65–99)
Glucose-Capillary: 115 mg/dL — ABNORMAL HIGH (ref 65–99)

## 2015-03-16 LAB — CARBOXYHEMOGLOBIN
Carboxyhemoglobin: 2 % — ABNORMAL HIGH (ref 0.5–1.5)
Carboxyhemoglobin: 1.9 % — ABNORMAL HIGH (ref 0.5–1.5)
METHEMOGLOBIN: 0.9 % (ref 0.0–1.5)
Methemoglobin: 0.9 % (ref 0.0–1.5)
O2 SAT: 40.3 %
O2 Saturation: 40.9 %
TOTAL HEMOGLOBIN: 9.8 g/dL — AB (ref 13.5–18.0)
TOTAL HEMOGLOBIN: 9.5 g/dL — AB (ref 13.5–18.0)

## 2015-03-16 LAB — HEMOGLOBIN A1C
HEMOGLOBIN A1C: 8.8 % — AB (ref 4.8–5.6)
Mean Plasma Glucose: 206 mg/dL

## 2015-03-16 LAB — MAGNESIUM: MAGNESIUM: 1.8 mg/dL (ref 1.7–2.4)

## 2015-03-16 LAB — MRSA PCR SCREENING: MRSA by PCR: NEGATIVE

## 2015-03-16 MED ORDER — MILRINONE IN DEXTROSE 20 MG/100ML IV SOLN
0.3750 ug/kg/min | INTRAVENOUS | Status: DC
  Administered 2015-03-16: 0.25 ug/kg/min via INTRAVENOUS
  Administered 2015-03-17 (×2): 0.375 ug/kg/min via INTRAVENOUS
  Filled 2015-03-16 (×3): qty 100

## 2015-03-16 MED ORDER — ENOXAPARIN SODIUM 30 MG/0.3ML ~~LOC~~ SOLN: 30.0000 mg | Freq: Two times a day (BID) | SUBCUTANEOUS | Status: DC

## 2015-03-16 MED ORDER — METOPROLOL TARTRATE 25 MG PO TABS: 25.0000 mg | ORAL_TABLET | Freq: Two times a day (BID) | ORAL | Status: DC

## 2015-03-16 MED ORDER — SODIUM CHLORIDE 0.9 % IJ SOLN
10.0000 mL | Freq: Two times a day (BID) | INTRAMUSCULAR | Status: DC
  Administered 2015-03-16: 10 mL
  Administered 2015-03-17: 20 mL
  Administered 2015-03-17 – 2015-03-22 (×10): 10 mL
  Administered 2015-03-23: 20 mL

## 2015-03-16 MED ORDER — SODIUM CHLORIDE 0.9 % IJ SOLN
10.0000 mL | INTRAMUSCULAR | Status: DC | PRN
  Administered 2015-03-20: 10 mL
  Filled 2015-03-16: qty 40

## 2015-03-16 NOTE — Progress Notes (Signed)
Peripherally Inserted Central Catheter/Midline Placement  The IV Nurse has discussed with the patient and/or persons authorized to consent for the patient, the purpose of this procedure and the potential benefits and risks involved with this procedure.  The benefits include less needle sticks, lab draws from the catheter and patient may be discharged home with the catheter.  Risks include, but not limited to, infection, bleeding, blood clot (thrombus formation), and puncture of an artery; nerve damage and irregular heat beat.  Alternatives to this procedure were also discussed.  PICC/Midline Placement Documentation        [name] 03/16/2015, 2:29 PM

## 2015-03-16 NOTE — Hospital Discharge Follow-Up (Signed)
[facility] [place]:  Patient known to the [facility]. Met with patient at bedside. Patient indicated he would like to receive follow-up and medical management with the [facility] again after discharge. Appointment scheduled for 03/28/15 at 1400 with Dr. Jarold Song. Appointment placed on AVS.  Patient appreciative of visit.  Marvetta Gibbons, RN CM updated. Will continue to follow patient's clinical progress.

## 2015-03-16 NOTE — Progress Notes (Addendum)
[facility] PROGRESS NOTE  Travez Yaccarino UJW:[id number] DOB: 1956-03-06 DOA: 03/14/2015 PCP: [name], MD  Length of stay: 2   Assessment/Plan: Active Problems:   Atrial fibrillation with RVR (HCC)   SOB (shortness of breath)   Morbid obesity due to excess calories (HCC)    # Acute on chronic systolic heart failure Continue Lasix 120 IV BID, urine output overnight, cardiology added Zaroxolyn 12/21, creatinine has gone up, likely has cardiorenal syndrome, Aldactone discontinued because of a potassium of 5.4 Continue digoxin, metoprolol, heart failure service to transfer patient and start milrinone drip  last admission 9/16, and patient required CVP monitoring, was found to be in cardiogenic shock and required vasopressors and milrinone transfer to 2H SDU for IV milrinone and monitoring of CVPs and co-ox.   # Long-standing persistent AF, poor rate control - Continue xarelto ,CHADSVASC2 of 4 - No recent bleeds despite prior bright red blood --> Cardioversion is an option - However, his last documented sinus rhythm that I can find is 10/01/11; - prior attempt at rhythm control with amiodarone this summer was unsuccessful (see hospital notes 11/2014) Xarelto was held in setting of GI bleed and restarted on September 8 Patient also has an ICD and needs interrogation   3. Diabetes mellitus type II: His Hbg A1c in August 2016 is 8.1 Continue SSI Hold glipizide  4.COPD, stable, question right basilar infiltrate, started patient on Rocephin and doxycycline,  DVT prophylaxsis Xarelto  Code Status:    Family Communication: Discussed in detail with the patient, all imaging results, lab results explained to the patient   Disposition Plan: Transfer to heart failure service   Brief narrative: [name] with hx of HTN, HLD, DM2, CAD s/p 2V CABG w/ MAZE, ischemic cardiomyopathy EF 30% s/p Quesada ICD ([employer]), long standing persistent atrial fibrillation, poorly  rate-controlled (clinic HRs in December 130s and up), on xarelto, who presents in decompensated heart failure.  Per clinic note on 02/27/15 he had gained 10 pounds and his lasix was increased from 60 BID to 120 BID.  He states he missed his lasix dose this morning, but has had worsening HF over the past week, notably with increasing DOE (can only walk a few feet before SOB), orthopnea, and frequent PND.  He had one sustained episode of chest pressure that lasted hours this morning, but this has resolved and initial troponin is negative. He denies any salty meals, and states other than missing lasix today, he has been adherent to all meds, including Xarelto (adamant he takes daily).   Consultants:  Cardiology  Procedures:  None  Antibiotics: Anti-infectives    Start     Dose/Rate Route Frequency Ordered Stop   03/15/15 1200  cefTRIAXone (ROCEPHIN) 1 g in dextrose 5 % 50 mL IVPB     1 g 100 mL/hr over 30 Minutes Intravenous Every 24 hours 03/15/15 1056     03/15/15 1200  doxycycline (VIBRAMYCIN) 100 mg in dextrose 5 % 250 mL IVPB     100 mg 125 mL/hr over 120 Minutes Intravenous Every 12 hours 03/15/15 1056     03/15/15 0900  levofloxacin (LEVAQUIN) IVPB 750 mg  Status:  Discontinued     750 mg 100 mL/hr over 90 Minutes Intravenous Every 24 hours 03/15/15 0831 03/15/15 1056         HPI/Subjective: Little improvement since yesterday. Still short of breath , urine output 1-1/2 L in 24 hours  Objective: Filed Vitals:   03/15/15 2308  03/16/15 0447 03/16/15 0926 03/16/15 1005  BP: 98/61 97/76  109/68  Pulse: 95 87 85 81  Temp:  98.2 F (36.8 C)    TempSrc:  Oral    Resp:  19 18   Height:      Weight:  105.507 kg (232 lb 9.6 oz)    SpO2: 92% 100% 96%     Intake/Output Summary (Last 24 hours) at 03/16/15 1106 Last data filed at 03/16/15 1000  Gross per 24 hour  Intake    484 ml  Output   2225 ml  Net  -1741 ml    Exam:  General: No acute respiratory  distress Lungs: Clear to auscultation bilaterally without wheezes or crackles Cardiovascular: Regular rate and rhythm without murmur gallop or rub normal S1 and S2 Abdomen: Nontender, nondistended, soft, bowel sounds positive, no rebound, no ascites, no appreciable mass Extremities: Bilateral pitting edema     Data Review   Micro Results No results found for this or any previous visit (from the past 240 hour(s)).  Radiology Reports Dg Chest 2 View  03/14/2015  CLINICAL DATA:  Chest pain for 1 day EXAM: CHEST - 2 VIEW COMPARISON:  10/19/6 pain FINDINGS: Cardiac shadow is prominent. Postsurgical changes are again seen. A defibrillator is again noted. New right basilar infiltrate with associated effusion is seen. Mild vascular congestion is noted as well. IMPRESSION: Right basilar infiltrate with associated effusion as well as mild vascular congestion. Electronically Signed   By: Alcide Clever M.D.   On: 03/14/2015 20:34     CBC  Recent Labs Lab 03/14/15 1920 03/15/15 0440 03/16/15 0340  WBC 8.0 8.8 9.2  HGB 9.7* 9.8* 10.5*  HCT 37.4* 38.0* 39.0  PLT 287 255 308  MCV 70.7* 71.0* 69.9*  MCH 18.3* 18.3* 18.8*  MCHC 25.9* 25.8* 26.9*  RDW 23.3* 23.1* 23.6*    Chemistries   Recent Labs Lab 03/14/15 1920 03/15/15 0440 03/16/15 0025 03/16/15 0340  NA 134* 134*  --  130*  K 5.2* 4.3  --  5.4*  CL 98* 95*  --  91*  CO2 27 29  --  28  GLUCOSE 319* 188*  --  170*  BUN 24* 25*  --  33*  CREATININE 1.17 1.25*  --  1.68*  CALCIUM 8.6* 8.9  --  8.6*  MG  --   --  1.8  --   AST  --   --   --  23  ALT  --   --   --  15*  ALKPHOS  --   --   --  152*  BILITOT  --   --   --  2.0*   ------------------------------------------------------------------------------------------------------------------ estimated creatinine clearance is 53.6 mL/min (by C-G formula based on Cr of  1.68). ------------------------------------------------------------------------------------------------------------------  Recent Labs  03/15/15 0440  HGBA1C 8.8*   ------------------------------------------------------------------------------------------------------------------ No results for input(s): CHOL, HDL, LDLCALC, TRIG, CHOLHDL, LDLDIRECT in the last 72 hours. ------------------------------------------------------------------------------------------------------------------ No results for input(s): TSH, T4TOTAL, T3FREE, THYROIDAB in the last 72 hours.  Invalid input(s): FREET3 ------------------------------------------------------------------------------------------------------------------ No results for input(s): VITAMINB12, FOLATE, FERRITIN, TIBC, IRON, RETICCTPCT in the last 72 hours.  Coagulation profile  Recent Labs Lab 03/14/15 1920  INR 1.59*    No results for input(s): DDIMER in the last 72 hours.  Cardiac Enzymes No results for input(s): CKMB, TROPONINI, MYOGLOBIN in the last 168 hours.  Invalid input(s): CK ------------------------------------------------------------------------------------------------------------------ Invalid input(s): POCBNP   CBG:  Recent Labs Lab 03/15/15 1220 03/15/15 1705 03/15/15 2129  03/15/15 2249 03/16/15 0615  GLUCAP 164* 127* 187* 246* 139*       Studies: Dg Chest 2 View  03/14/2015  CLINICAL DATA:  Chest pain for 1 day EXAM: CHEST - 2 VIEW COMPARISON:  10/19/6 pain FINDINGS: Cardiac shadow is prominent. Postsurgical changes are again seen. A defibrillator is again noted. New right basilar infiltrate with associated effusion is seen. Mild vascular congestion is noted as well. IMPRESSION: Right basilar infiltrate with associated effusion as well as mild vascular congestion. Electronically Signed   By: Alcide Clever M.D.   On: 03/14/2015 20:34      Lab Results  Component Value Date   HGBA1C 8.8* 03/15/2015    HGBA1C 6.9 12/22/2014   HGBA1C 8.1* 11/12/2014   Lab Results  Component Value Date   LDLCALC 52 08/05/2014   CREATININE 1.68* 03/16/2015       Scheduled Meds: . atorvastatin  40 mg Oral q1800  . cefTRIAXone (ROCEPHIN)  IV  1 g Intravenous Q24H  . digoxin  0.0625 mg Oral Daily  . docusate sodium  100 mg Oral Daily  . doxycycline (VIBRAMYCIN) IV  100 mg Intravenous Q12H  . enoxaparin (LOVENOX) injection  30 mg Subcutaneous Q12H  . feeding supplement (ENSURE ENLIVE)  237 mL Oral BID BM  . ferrous sulfate  325 mg Oral TID WC  . furosemide  120 mg Intravenous BID  . insulin aspart  0-15 Units Subcutaneous TID WC  . insulin aspart  0-5 Units Subcutaneous QHS  . metolazone  5 mg Oral Daily  . metoprolol tartrate  50 mg Oral Q6H  . mometasone-formoterol  2 puff Inhalation BID  . pantoprazole  40 mg Oral BID  . rivaroxaban  20 mg Oral Daily   Continuous Infusions: . milrinone      Active Problems:   Atrial fibrillation with RVR (HCC)   SOB (shortness of breath)   Morbid obesity due to excess calories (HCC)    Time spent: 45 minutes   [facility]  [facility] Pager [phone]. If 7PM-7AM, please contact night-coverage at [email or web address], password [facility] 03/16/2015, 11:06 AM  LOS: 2 days

## 2015-03-16 NOTE — Progress Notes (Signed)
Patient Profile: 89yoM with hx of ischemic cardiomyopathy, long-standing persistent afib, poorly controlled, on xarelto, admitted with decompensated heart failure in setting missed lasix and more likely from poor rate control   Subjective: Little improvement since yesterday. Still short of breath.Frustrated.    Objective: Vital signs in last 24 hours: Temp:  [97.3 F (36.3 C)-98.2 F (36.8 C)] 98.2 F (36.8 C) (12/22 0447) Pulse Rate:  [81-95] 81 (12/22 1005) Resp:  [18-22] 18 (12/22 0926) BP: (90-109)/(61-76) 109/68 mmHg (12/22 1005) SpO2:  [92 %-100 %] 96 % (12/22 0926) Weight:  [232 lb 9.6 oz (105.507 kg)] 232 lb 9.6 oz (105.507 kg) (12/22 0447) Last BM Date: 03/14/15  Intake/Output from previous day: 12/21 0701 - 12/22 0700 In: 484 [P.O.:360; IV Piggyback:124] Out: 2100 [Urine:2100] Intake/Output this shift: Total I/O In: -  Out: 125 [Urine:125]  Medications Current Facility-Administered Medications  Medication Dose Route Frequency Provider Last Rate Last Dose  . albuterol (PROVENTIL) (2.5 MG/3ML) 0.083% nebulizer solution 2.5 mg  2.5 mg Nebulization Q6H PRN Dossie Arbour, MD      . atorvastatin (LIPITOR) tablet 40 mg  40 mg Oral q1800 Dossie Arbour, MD   40 mg at 03/15/15 1707  . cefTRIAXone (ROCEPHIN) 1 g in dextrose 5 % 50 mL IVPB  1 g Intravenous Q24H Richarda Overlie, MD   1 g at 03/15/15 1148  . digoxin (LANOXIN) tablet 0.0625 mg  0.0625 mg Oral Daily Dossie Arbour, MD   0.0625 mg at 03/16/15 1005  . docusate sodium (COLACE) capsule 100 mg  100 mg Oral Daily Dossie Arbour, MD   100 mg at 03/16/15 1005  . doxycycline (VIBRAMYCIN) 100 mg in dextrose 5 % 250 mL IVPB  100 mg Intravenous Q12H Richarda Overlie, MD   100 mg at 03/15/15 2310  . feeding supplement (ENSURE ENLIVE) (ENSURE ENLIVE) liquid 237 mL  237 mL Oral BID BM Dossie Arbour, MD   237 mL at 03/15/15 1500  . ferrous sulfate tablet 325 mg  325 mg Oral TID WC Dossie Arbour, MD   325 mg at 03/16/15 0810  . furosemide (LASIX) 120 mg  in dextrose 5 % 50 mL IVPB  120 mg Intravenous BID Dossie Arbour, MD   120 mg at 03/16/15 4098  . insulin aspart (novoLOG) injection 0-15 Units  0-15 Units Subcutaneous TID WC Dossie Arbour, MD   2 Units at 03/16/15 [phone]  . insulin aspart (novoLOG) injection 0-5 Units  0-5 Units Subcutaneous QHS Dossie Arbour, MD   3 Units at 03/15/15 0013  . metolazone (ZAROXOLYN) tablet 5 mg  5 mg Oral Daily Jake Bathe, MD   5 mg at 03/16/15 1005  . metoprolol (LOPRESSOR) tablet 50 mg  50 mg Oral Q6H Dossie Arbour, MD   50 mg at 03/16/15 0501  . mometasone-formoterol (DULERA) 100-5 MCG/ACT inhaler 2 puff  2 puff Inhalation BID Dossie Arbour, MD   2 puff at 03/16/15 [phone]  . pantoprazole (PROTONIX) EC tablet 40 mg  40 mg Oral BID Dossie Arbour, MD   40 mg at 03/15/15 2124  . rivaroxaban (XARELTO) tablet 20 mg  20 mg Oral Daily Dossie Arbour, MD   20 mg at 03/16/15 1005  . spironolactone (ALDACTONE) tablet 12.5 mg  12.5 mg Oral Daily Dossie Arbour, MD   12.5 mg at 03/16/15 1005  . traMADol (ULTRAM) tablet 50 mg  50 mg Oral Q8H PRN Dossie Arbour, MD   50 mg at 03/16/15 0321    PE: General appearance: alert,  cooperative and no distress Neck: no carotid bruit and no JVD Lungs: clear to auscultation bilaterally Heart: irregularly irregular rhythm and regular rate Extremities: tense bilateral LEE Pulses: 2+ and symmetric Skin: warm and dry Neurologic: Grossly normal  Lab Results:   Recent Labs  03/14/15 1920 03/15/15 0440 03/16/15 0340  WBC 8.0 8.8 9.2  HGB 9.7* 9.8* 10.5*  HCT 37.4* 38.0* 39.0  PLT 287 255 308   BMET  Recent Labs  03/14/15 1920 03/15/15 0440 03/16/15 0340  NA 134* 134* 130*  K 5.2* 4.3 5.4*  CL 98* 95* 91*  CO2 27 29 28   GLUCOSE 319* 188* 170*  BUN 24* 25* 33*  CREATININE 1.17 1.25* 1.68*  CALCIUM 8.6* 8.9 8.6*   PT/INR  Recent Labs  03/14/15 1920  LABPROT 19.0*  INR 1.59*     Assessment/Plan  Active Problems:   Atrial fibrillation with RVR (HCC)   SOB (shortness of breath)    Morbid obesity due to excess calories (HCC)   1. Acute on Chronic CHF (EF 30%) - Home regimen is lasix 80 po bid; converted to 120 IV BID on admit with little UOP.  Continue to monitor closely. Added metolazone 12/21 - He is still feeling poor. Creat has risen. Wonder if milrinone would be beneficial to assist with diuresis. Will consult CHF team. Dr. B has seen in the past.  - c/w digoxin, metoprolol - Will stop aldactone with K 5.4   2. Long-standing persistent AF - HR is better controlled in the 90s.  - continue metoprolol and digoxin - avoid use of diltiazem given his LV dysfunction.  - States he is adherent to xarelto  - No recent bleeds despite prior bright red blood  - prior attempt at rhythm control with amiodarone this summer was unsuccessful (see hospital notes 11/2014)  3.  Diabetes - Change oral anti-glycemics to SS insulin  4. CAD: h/o CABG. Denies CP.   5. AKI: worsening renal function. ?Milrinone.     LOS: 2 days     Donato Schultz, MD

## 2015-03-16 NOTE — Progress Notes (Signed)
    ADVANCED HF TEAM PROGRESS NOTE  Patient well known to me from prolonged hospitalization in 9/16 for cardiogenic shock. AF with RVR and GI bleed. During that admission required dual inotropes (milrinone/levophed) but was able to be weaned. Failed DC-CV of AF and tolerated rate control.   He has iCM with EF 25-30%/ Now readmitted with recurrent Class IV HF symptoms with marked volume overload (weight up nearly 30 pounds from previous d/c) and AF with RVR 115-120. Has had little response to IV lasix. Renal function contiinues to deteriorate.   Patient moved to SDU and PICC line placed. Co-ox 40% with CVP 17. Milrinone initiated and now beginning to feel better. Breathing less labored. Remains in AF with HR now in 80s  On exam Filed Vitals:   03/16/15 1700 03/16/15 1703  BP: 94/70 113/84  Pulse: 115 83  Temp:    Resp: 23    Mild dyspnea at rest JVP to ear Cor IRR IRR with prominent s3 Lungs: + crackles Ab: distended hypoactive BS Ext: cool 3+ tight edema  Assessment:  1) Cardiogenic shock 2) A/c systolic HF 3) Acute respiratory failure 4) Cardiorenal syndrome with acute renal failure due to #1 5. Chronic AF - failed DC-CV on amio in 9/16  Plan/discussion:  He has recurrent low output HF. We have now re-initiated IV inotropes. Will follow co-ox and CVP. Continue Xarelto for AF. We may need to consider home inotropes though his social situation presents some hurdles.   The patient is critically ill with multiple organ systems failure and requires high complexity decision making for assessment and support, frequent evaluation and titration of therapies, application of advanced monitoring technologies and extensive interpretation of multiple databases.   Critical Care Time devoted to patient care services described in this note is 45 Minutes.  Damian Buckles,MD 6:30 PM

## 2015-03-16 NOTE — Progress Notes (Signed)
  Patient well known to the HF service. Chart reviewed. Likely has recurrent low output HF and cardiorenal syndrome.  Will place PICC. And transfer to [facility] SDU for IV milrinone and monitoring of CVPs and co-ox. Full consult to follow.   May need to consider AVN ablation and BiVpacer if intractable AF with RVR.   Bensimhon, Daniel,MD 10:49 AM

## 2015-03-17 DIAGNOSIS — I1310 Hypertensive heart and chronic kidney disease without heart failure, with stage 1 through stage 4 chronic kidney disease, or unspecified chronic kidney disease: Secondary | ICD-10-CM

## 2015-03-17 DIAGNOSIS — N19 Unspecified kidney failure: Secondary | ICD-10-CM

## 2015-03-17 LAB — GLUCOSE, CAPILLARY
GLUCOSE-CAPILLARY: 187 mg/dL — AB (ref 65–99)
GLUCOSE-CAPILLARY: 245 mg/dL — AB (ref 65–99)
Glucose-Capillary: 181 mg/dL — ABNORMAL HIGH (ref 65–99)

## 2015-03-17 LAB — COMPREHENSIVE METABOLIC PANEL
ALK PHOS: 155 U/L — AB (ref 38–126)
ALT: 13 U/L — AB (ref 17–63)
AST: 22 U/L (ref 15–41)
Albumin: 2.6 g/dL — ABNORMAL LOW (ref 3.5–5.0)
Anion gap: 9 (ref 5–15)
BILIRUBIN TOTAL: 1.7 mg/dL — AB (ref 0.3–1.2)
BUN: 39 mg/dL — ABNORMAL HIGH (ref 6–20)
CALCIUM: 8.3 mg/dL — AB (ref 8.9–10.3)
CHLORIDE: 90 mmol/L — AB (ref 101–111)
CO2: 31 mmol/L (ref 22–32)
CREATININE: 1.85 mg/dL — AB (ref 0.61–1.24)
GFR, EST AFRICAN AMERICAN: 45 mL/min — AB (ref >60)
GFR, EST NON AFRICAN AMERICAN: 39 mL/min — AB (ref >60)
Glucose, Bld: 246 mg/dL — ABNORMAL HIGH (ref 65–99)
Potassium: 4.1 mmol/L (ref 3.5–5.1)
SODIUM: 130 mmol/L — AB (ref 135–145)
TOTAL PROTEIN: 6.4 g/dL — AB (ref 6.5–8.1)

## 2015-03-17 LAB — CBC
HEMATOCRIT: 35.8 % — AB (ref 39.0–52.0)
HEMOGLOBIN: 10 g/dL — AB (ref 13.0–17.0)
MCH: 19.1 pg — AB (ref 26.0–34.0)
MCHC: 27.9 g/dL — AB (ref 30.0–36.0)
MCV: 68.5 fL — AB (ref 78.0–100.0)
Platelets: 285 10*3/uL (ref 150–400)
RBC: 5.23 MIL/uL (ref 4.22–5.81)
RDW: 23.6 % — ABNORMAL HIGH (ref 11.5–15.5)
WBC: 9.1 10*3/uL (ref 4.0–10.5)

## 2015-03-17 LAB — CARBOXYHEMOGLOBIN
Carboxyhemoglobin: 2.3 % — ABNORMAL HIGH (ref 0.5–1.5)
Methemoglobin: 0.8 % (ref 0.0–1.5)
O2 Saturation: 52.3 %
Total hemoglobin: 10.7 g/dL — ABNORMAL LOW (ref 13.5–18.0)

## 2015-03-17 MED ORDER — AMIODARONE HCL IN DEXTROSE 360-4.14 MG/200ML-% IV SOLN
60.0000 mg/h | INTRAVENOUS | Status: AC
  Administered 2015-03-17 (×2): 60 mg/h via INTRAVENOUS
  Filled 2015-03-17 (×2): qty 200

## 2015-03-17 MED ORDER — MILRINONE IN DEXTROSE 20 MG/100ML IV SOLN
0.3750 ug/kg/min | INTRAVENOUS | Status: DC
  Administered 2015-03-17 – 2015-03-21 (×9): 0.375 ug/kg/min via INTRAVENOUS
  Filled 2015-03-17 (×10): qty 100

## 2015-03-17 MED ORDER — AMIODARONE HCL IN DEXTROSE 360-4.14 MG/200ML-% IV SOLN
30.0000 mg/h | INTRAVENOUS | Status: DC
  Administered 2015-03-18 – 2015-03-20 (×5): 30 mg/h via INTRAVENOUS
  Filled 2015-03-17 (×6): qty 200

## 2015-03-17 MED ORDER — AMIODARONE LOAD VIA INFUSION
150.0000 mg | Freq: Once | INTRAVENOUS | Status: AC
  Administered 2015-03-17: 150 mg via INTRAVENOUS
  Filled 2015-03-17: qty 83.34

## 2015-03-17 NOTE — Progress Notes (Addendum)
ADVANCED HF TEAM PROGRESS NOTE  He had  prolonged hospitalization in 9/16 for cardiogenic shock. AF with RVR and GI bleed. During that admission required dual inotropes (milrinone/levophed) but was able to be weaned. Failed DC-CV of AF and tolerated rate control.   He has iCM with EF 25-30%. Readmitted with recurrent Class IV HF symptoms with marked volume overload (weight up nearly 30 pounds from previous d/c) and AF with RVR 115-120. Has had little response to IV lasix. Renal function contiinues to deteriorate.   Yesterday started on milrinone 0.25 mcg. CO-OX was 40% so milrinone was increased to  0.375 mcg. He has been diuresing with lasix drip. Weight down 3 pounds. Creatinine trending up 1.68>1.8  Todays CO-OX is 52%.   Today he is feeling better. Denies SOB/Orthopnea.    Scheduled Meds: . atorvastatin  40 mg Oral q1800  . cefTRIAXone (ROCEPHIN)  IV  1 g Intravenous Q24H  . digoxin  0.0625 mg Oral Daily  . docusate sodium  100 mg Oral Daily  . doxycycline (VIBRAMYCIN) IV  100 mg Intravenous Q12H  . feeding supplement (ENSURE ENLIVE)  237 mL Oral BID BM  . ferrous sulfate  325 mg Oral TID WC  . furosemide  120 mg Intravenous BID  . insulin aspart  0-15 Units Subcutaneous TID WC  . insulin aspart  0-5 Units Subcutaneous QHS  . metolazone  5 mg Oral Daily  . metoprolol tartrate  25 mg Oral BID  . mometasone-formoterol  2 puff Inhalation BID  . pantoprazole  40 mg Oral BID  . rivaroxaban  20 mg Oral Daily  . sodium chloride  10-40 mL Intracatheter Q12H   Continuous Infusions: . milrinone 0.375 mcg/kg/min (03/17/15 0804)   PRN Meds:.albuterol, sodium chloride, traMADol  On exam Filed Vitals:   03/17/15 0600 03/17/15 0734  BP:  126/82  Pulse:  90  Temp:  97.2 F (36.2 C)  Resp: 18 20   PHYSICAL EXAM: CVP 11-12  General: Chronically ill appearing. No resp difficulty. Arrived in a wheelchair HEENT: normal Neck: supple. JVP to jaw . Carotids 2+ bilaterally; no  bruits. No lymphadenopathy or thryomegaly appreciated. Cor: PMI normal. Irregular rate & rhythm. No rubs, gallops or murmurs. Lungs: clear Abdomen: soft, nontender, nondistended. No hepatosplenomegaly. No bruits or masses. Good bowel sounds. Extremities: no cyanosis, clubbing, rash, R and LLE 1-2+ edema Neuro: alert & orientedx3, cranial nerves grossly intact. Moves all 4 extremities w/o difficulty. Affect pleasant.  EKG: A fib 100-110s Assessment: 1) Cardiogenic shock 2) A/c systolic HF 3) Acute respiratory failure 4) Cardiorenal syndrome with acute renal failure due to #1 5. Chronic AF - failed DC-CV on amio in 9/16  Plan/discussion: Todays CO-OX is marginal 52% on 0.375 mcg milrinone. Can consider switching to dobutamine. Continue dig 0.0625 mg daily. CVP ~11-12. Continue lasix 120 mg twice a day + metolazone. Renal function trending up. No ACE with CKD. Hold off on hydralazine/nitrate. Add ted hose.    Remains in A fib RVR. On Xarelto 20 mg daily . Failed DC-CV. Hold off on bb for now with cardiogenic shock.   Consult cardiac rehab.    Amy Clegg NP-C  8:29 AM  Patient seen and examined with Tonye Becket, NP. We discussed all aspects of the encounter. I agree with the assessment and plan as stated above.   Improved with high-dose milrinone and IV diuresis but remains very tenuous. AF rate back up again with decreasing b-blocker (due to low output). CVP 12 (checked  personally). Will continue inotropes and IV lasix. May need to consider home inotropes but social situation a challenge.  AF certainly complicating matters. Will place on digoxin. May need to consider AVN ablation with biV PPM. Will start amiodarone now for rate control.   Bensimhon, Daniel,MD 10:36 AM

## 2015-03-17 NOTE — Progress Notes (Signed)
  Amiodarone Drug - Drug Interaction Consult Note  Recommendations: Will monitor LOT and need for follow up digoxin level and dose adjustment Atorvastatin dose ok  Amiodarone is metabolized by the cytochrome P450 system and therefore has the potential to cause many drug interactions. Amiodarone has an average plasma half-life of 50 days (range 20 to 100 days).   There is potential for drug interactions to occur several weeks or months after stopping treatment and the onset of drug interactions may be slow after initiating amiodarone.   [x]  Statins: Increased risk of myopathy. Simvastatin- restrict dose to 20mg  daily. Other statins: counsel patients to report any muscle pain or weakness immediately.  []  Anticoagulants: Amiodarone can increase anticoagulant effect. Consider warfarin dose reduction. Patients should be monitored closely and the dose of anticoagulant altered accordingly, remembering that amiodarone levels take several weeks to stabilize.  []  Antiepileptics: Amiodarone can increase plasma concentration of phenytoin, the dose should be reduced. Note that small changes in phenytoin dose can result in large changes in levels. Monitor patient and counsel on signs of toxicity.  []  Beta blockers: increased risk of bradycardia, AV block and myocardial depression. Sotalol - avoid concomitant use.  []   Calcium channel blockers (diltiazem and verapamil): increased risk of bradycardia, AV block and myocardial depression.  []   Cyclosporine: Amiodarone increases levels of cyclosporine. Reduced dose of cyclosporine is recommended.  [x]  Digoxin dose should be halved when amiodarone is started.  []  Diuretics: increased risk of cardiotoxicity if hypokalemia occurs.  []  Oral hypoglycemic agents (glyburide, glipizide, glimepiride): increased risk of hypoglycemia. Patient's glucose levels should be monitored closely when initiating amiodarone therapy.   []  Drugs that prolong the QT interval:   Torsades de pointes risk may be increased with concurrent use - avoid if possible.  Monitor QTc, also keep magnesium/potassium WNL if concurrent therapy can't be avoided. Marland Kitchen Antibiotics: e.g. fluoroquinolones, erythromycin. . Antiarrhythmics: e.g. quinidine, procainamide, disopyramide, sotalol. . Antipsychotics: e.g. phenothiazines, haloperidol.  . Lithium, tricyclic antidepressants, and methadone. Thank You,  Leota Sauers Pharm.D. CPP, BCPS Clinical Pharmacist [phone] 03/17/2015 1:50 PM

## 2015-03-17 NOTE — Progress Notes (Addendum)
[facility] PROGRESS NOTE  Jacob [name] ZOX:[id number] DOB: November 12, 1955 DOA: 03/14/2015 PCP: [name], MD  Length of stay: 3   Assessment/Plan: Active Problems:   Atrial fibrillation with RVR (HCC)   SOB (shortness of breath)   Morbid obesity due to excess calories (HCC)   Acute on chronic systolic (congestive) heart failure (HCC)   Cardiorenal syndrome with renal failure    # Acute on chronic systolic heart failure Continue Lasix 120 IV BID, urine output overnight, cardiology added Zaroxolyn 12/21, creatinine has gone up, likely has cardiorenal syndrome, Aldactone discontinued because of a potassium of 5.4 Continue digoxin,  milrinone, Zaroxolyn, heart failure following, started milrinone 12/22, Renal function trending up  last admission 9/16, and patient required CVP monitoring, was found to be in cardiogenic shock and required vasopressors and milrinone Transferred to 2H SDU for IV milrinone and monitoring of CVPs and co-ox.   # Long-standing persistent AF, poor rate control  - Continue xarelto ,CHADSVASC2 of 4 - No recent bleeds despite prior bright red blood --> Cardioversion is an option - However, his last documented sinus rhythm that I can find is 10/01/11; - prior attempt at rhythm control with amiodarone this summer was unsuccessful (see hospital notes 11/2014) Xarelto was held in setting of GI bleed and restarted on September 8 Patient also has an ICD and needs interrogation Continue dig 0.0625 mg , metoprolol discontinued  3. Diabetes mellitus type II: His Hbg A1c in August 2016 is 8.1 Continue SSI Hold glipizide  4.COPD, stable, question right basilar infiltrate, continue Rocephin and doxycycline, day #3 of antibiotics  DVT prophylaxsis Xarelto  Code Status:    Family Communication: Discussed in detail with the patient, all imaging results, lab results explained to the patient   Disposition Plan: Continue stepdown   Brief  narrative: [name] with hx of HTN, HLD, DM2, CAD s/p 2V CABG w/ MAZE, ischemic cardiomyopathy EF 30% s/p [place] ICD ([employer]), long standing persistent atrial fibrillation, poorly rate-controlled (clinic HRs in December 130s and up), on xarelto, who presents in decompensated heart failure.  Per clinic note on 02/27/15 he had gained 10 pounds and his lasix was increased from 60 BID to 120 BID.  He states he missed his lasix dose this morning, but has had worsening HF over the past week, notably with increasing DOE (can only walk a few feet before SOB), orthopnea, and frequent PND.  He had one sustained episode of chest pressure that lasted hours this morning, but this has resolved and initial troponin is negative. He denies any salty meals, and states other than missing lasix today, he has been adherent to all meds, including Xarelto (adamant he takes daily).   Consultants:  Cardiology  Procedures:  None  Antibiotics: Anti-infectives    Start     Dose/Rate Route Frequency Ordered Stop   03/15/15 1200  cefTRIAXone (ROCEPHIN) 1 g in dextrose 5 % 50 mL IVPB     1 g 100 mL/hr over 30 Minutes Intravenous Every 24 hours 03/15/15 1056     03/15/15 1200  doxycycline (VIBRAMYCIN) 100 mg in dextrose 5 % 250 mL IVPB     100 mg 125 mL/hr over 120 Minutes Intravenous Every 12 hours 03/15/15 1056     03/15/15 0900  levofloxacin (LEVAQUIN) IVPB 750 mg  Status:  Discontinued     750 mg 100 mL/hr over 90 Minutes Intravenous Every 24 hours 03/15/15 0831 03/15/15 1056  HPI/Subjective: 1821 cc of urine output over 24 hours, on Lasix drip,Weight down 3 pounds  Objective: Filed Vitals:   03/17/15 0400 03/17/15 0600 03/17/15 0734 03/17/15 1004  BP:   126/82   Pulse:   90 112  Temp:   97.2 F (36.2 C)   TempSrc:   Oral   Resp: 18 18 20    Height:      Weight:      SpO2:   100%     Intake/Output Summary (Last 24 hours) at 03/17/15 1044 Last data filed at 03/17/15 1005  Gross  per 24 hour  Intake 1888.86 ml  Output   3710 ml  Net -1821.14 ml    Exam:  General: Chronically ill appearing. No resp difficulty. Arrived in a wheelchair HEENT: normal Neck: supple. JVP to jaw . Carotids 2+ bilaterally; no bruits. No lymphadenopathy or thryomegaly appreciated. Cor: PMI normal. Irregular rate & rhythm. No rubs, gallops or murmurs. Lungs: clear Abdomen: soft, nontender, nondistended. No hepatosplenomegaly. No bruits or masses. Good bowel sounds. Extremities: no cyanosis, clubbing, rash, R and LLE 1-2+ edema Neuro: alert & orientedx3, cranial nerves grossly intact. Moves all 4 extremities w/o difficulty. Affect pleasant.  Data Review   Micro Results Recent Results (from the past 240 hour(s))  MRSA PCR Screening     Status: None   Collection Time: 03/16/15  3:45 PM  Result Value Ref Range Status   MRSA by PCR NEGATIVE NEGATIVE Final    Comment:        The GeneXpert MRSA Assay (FDA approved for NASAL specimens only), is one component of a comprehensive MRSA colonization surveillance program. It is not intended to diagnose MRSA infection nor to guide or monitor treatment for MRSA infections.     Radiology Reports Dg Chest 2 View  03/14/2015  CLINICAL DATA:  Chest pain for 1 day EXAM: CHEST - 2 VIEW COMPARISON:  10/19/6 pain FINDINGS: Cardiac shadow is prominent. Postsurgical changes are again seen. A defibrillator is again noted. New right basilar infiltrate with associated effusion is seen. Mild vascular congestion is noted as well. IMPRESSION: Right basilar infiltrate with associated effusion as well as mild vascular congestion. Electronically Signed   By: Alcide Clever M.D.   On: 03/14/2015 20:34   Dg Chest Port 1 View  03/16/2015  CLINICAL DATA:  Central line placement EXAM: PORTABLE CHEST 1 VIEW COMPARISON:  03/14/2015 FINDINGS: Cardiomediastinal silhouette is stable. Status post median sternotomy. Single lead cardiac pacemaker is unchanged in position.  Persistent small right pleural effusion with right basilar atelectasis or infiltrate. Central mild vascular congestion. There is a right arm/right subclavian central line with tip in the lower SVC. Please note the tip of the central line is not clearly identified due to patient's large body habitus and overlying chest wall artifacts. There is no pneumothorax. IMPRESSION: Persistent small right pleural effusion with right basilar atelectasis or infiltrate. Central mild vascular congestion. There is a right arm/right subclavian central line with tip in the lower SVC. Please note the tip of the central line is not clearly identified due to patient's large body habitus and overlying chest wall artifacts. There is no pneumothorax. Electronically Signed   By: Natasha Mead M.D.   On: 03/16/2015 15:52     CBC  Recent Labs Lab 03/14/15 1920 03/15/15 0440 03/16/15 0340 03/17/15 0431  WBC 8.0 8.8 9.2 9.1  HGB 9.7* 9.8* 10.5* 10.0*  HCT 37.4* 38.0* 39.0 35.8*  PLT 287 255 308 285  MCV 70.7* 71.0*  69.9* 68.5*  MCH 18.3* 18.3* 18.8* 19.1*  MCHC 25.9* 25.8* 26.9* 27.9*  RDW 23.3* 23.1* 23.6* 23.6*    Chemistries   Recent Labs Lab 03/14/15 1920 03/15/15 0440 03/16/15 0025 03/16/15 0340 03/17/15 0431  NA 134* 134*  --  130* 130*  K 5.2* 4.3  --  5.4* 4.1  CL 98* 95*  --  91* 90*  CO2 27 29  --  28 31  GLUCOSE 319* 188*  --  170* 246*  BUN 24* 25*  --  33* 39*  CREATININE 1.17 1.25*  --  1.68* 1.85*  CALCIUM 8.6* 8.9  --  8.6* 8.3*  MG  --   --  1.8  --   --   AST  --   --   --  23 22  ALT  --   --   --  15* 13*  ALKPHOS  --   --   --  152* 155*  BILITOT  --   --   --  2.0* 1.7*   ------------------------------------------------------------------------------------------------------------------ estimated creatinine clearance is 48.4 mL/min (by C-G formula based on Cr of  1.85). ------------------------------------------------------------------------------------------------------------------  Recent Labs  03/15/15 0440  HGBA1C 8.8*   ------------------------------------------------------------------------------------------------------------------ No results for input(s): CHOL, HDL, LDLCALC, TRIG, CHOLHDL, LDLDIRECT in the last 72 hours. ------------------------------------------------------------------------------------------------------------------ No results for input(s): TSH, T4TOTAL, T3FREE, THYROIDAB in the last 72 hours.  Invalid input(s): FREET3 ------------------------------------------------------------------------------------------------------------------ No results for input(s): VITAMINB12, FOLATE, FERRITIN, TIBC, IRON, RETICCTPCT in the last 72 hours.  Coagulation profile  Recent Labs Lab 03/14/15 1920  INR 1.59*    No results for input(s): DDIMER in the last 72 hours.  Cardiac Enzymes No results for input(s): CKMB, TROPONINI, MYOGLOBIN in the last 168 hours.  Invalid input(s): CK ------------------------------------------------------------------------------------------------------------------ Invalid input(s): POCBNP   CBG:  Recent Labs Lab 03/16/15 1130 03/16/15 1537 03/16/15 1646 03/16/15 2156 03/17/15 0738  GLUCAP 219* 132* 115* 195* 187*       Studies: Dg Chest Port 1 View  03/16/2015  CLINICAL DATA:  Central line placement EXAM: PORTABLE CHEST 1 VIEW COMPARISON:  03/14/2015 FINDINGS: Cardiomediastinal silhouette is stable. Status post median sternotomy. Single lead cardiac pacemaker is unchanged in position. Persistent small right pleural effusion with right basilar atelectasis or infiltrate. Central mild vascular congestion. There is a right arm/right subclavian central line with tip in the lower SVC. Please note the tip of the central line is not clearly identified due to patient's large body habitus and  overlying chest wall artifacts. There is no pneumothorax. IMPRESSION: Persistent small right pleural effusion with right basilar atelectasis or infiltrate. Central mild vascular congestion. There is a right arm/right subclavian central line with tip in the lower SVC. Please note the tip of the central line is not clearly identified due to patient's large body habitus and overlying chest wall artifacts. There is no pneumothorax. Electronically Signed   By: Natasha Mead M.D.   On: 03/16/2015 15:52      Lab Results  Component Value Date   HGBA1C 8.8* 03/15/2015   HGBA1C 6.9 12/22/2014   HGBA1C 8.1* 11/12/2014   Lab Results  Component Value Date   LDLCALC 52 08/05/2014   CREATININE 1.85* 03/17/2015       Scheduled Meds: . atorvastatin  40 mg Oral q1800  . cefTRIAXone (ROCEPHIN)  IV  1 g Intravenous Q24H  . digoxin  0.0625 mg Oral Daily  . docusate sodium  100 mg Oral Daily  . doxycycline (VIBRAMYCIN) IV  100 mg Intravenous  Q12H  . feeding supplement (ENSURE ENLIVE)  237 mL Oral BID BM  . ferrous sulfate  325 mg Oral TID WC  . furosemide  120 mg Intravenous BID  . insulin aspart  0-15 Units Subcutaneous TID WC  . insulin aspart  0-5 Units Subcutaneous QHS  . metolazone  5 mg Oral Daily  . mometasone-formoterol  2 puff Inhalation BID  . pantoprazole  40 mg Oral BID  . rivaroxaban  20 mg Oral Daily  . sodium chloride  10-40 mL Intracatheter Q12H   Continuous Infusions: . milrinone      Active Problems:   Atrial fibrillation with RVR (HCC)   SOB (shortness of breath)   Morbid obesity due to excess calories (HCC)   Acute on chronic systolic (congestive) heart failure (HCC)   Cardiorenal syndrome with renal failure    Time spent: 45 minutes   [facility]  [facility] Pager [phone]. If 7PM-7AM, please contact night-coverage at [email or web address], password [facility] 03/17/2015, 10:44 AM  LOS: 3 days

## 2015-03-18 LAB — COMPREHENSIVE METABOLIC PANEL
ALBUMIN: 2.7 g/dL — AB (ref 3.5–5.0)
ALT: 14 U/L — ABNORMAL LOW (ref 17–63)
ANION GAP: 11 (ref 5–15)
AST: 16 U/L (ref 15–41)
Alkaline Phosphatase: 130 U/L — ABNORMAL HIGH (ref 38–126)
BUN: 38 mg/dL — AB (ref 6–20)
CHLORIDE: 85 mmol/L — AB (ref 101–111)
CO2: 32 mmol/L (ref 22–32)
Calcium: 8.3 mg/dL — ABNORMAL LOW (ref 8.9–10.3)
Creatinine, Ser: 1.68 mg/dL — ABNORMAL HIGH (ref 0.61–1.24)
GFR calc Af Amer: 50 mL/min — ABNORMAL LOW (ref >60)
GFR calc non Af Amer: 43 mL/min — ABNORMAL LOW (ref >60)
GLUCOSE: 276 mg/dL — AB (ref 65–99)
POTASSIUM: 3.4 mmol/L — AB (ref 3.5–5.1)
SODIUM: 128 mmol/L — AB (ref 135–145)
Total Bilirubin: 1.5 mg/dL — ABNORMAL HIGH (ref 0.3–1.2)
Total Protein: 6.5 g/dL (ref 6.5–8.1)

## 2015-03-18 LAB — GLUCOSE, CAPILLARY
GLUCOSE-CAPILLARY: 284 mg/dL — AB (ref 65–99)
GLUCOSE-CAPILLARY: 241 mg/dL — AB (ref 65–99)
GLUCOSE-CAPILLARY: 222 mg/dL — AB (ref 65–99)
Glucose-Capillary: 178 mg/dL — ABNORMAL HIGH (ref 65–99)

## 2015-03-18 LAB — TSH: TSH: 4.69 u[IU]/mL — ABNORMAL HIGH (ref 0.350–4.500)

## 2015-03-18 LAB — CARBOXYHEMOGLOBIN
CARBOXYHEMOGLOBIN: 2.3 % — AB (ref 0.5–1.5)
Methemoglobin: 0.9 % (ref 0.0–1.5)
O2 SAT: 55.6 %
Total hemoglobin: 10.1 g/dL — ABNORMAL LOW (ref 13.5–18.0)

## 2015-03-18 LAB — MAGNESIUM: Magnesium: 1.5 mg/dL — ABNORMAL LOW (ref 1.7–2.4)

## 2015-03-18 MED ORDER — SPIRONOLACTONE 25 MG PO TABS
25.0000 mg | ORAL_TABLET | Freq: Every day | ORAL | Status: DC
  Administered 2015-03-18 – 2015-03-23 (×6): 25 mg via ORAL
  Filled 2015-03-18 (×6): qty 1

## 2015-03-18 MED ORDER — LEVALBUTEROL HCL 1.25 MG/0.5ML IN NEBU: 1.2500 mg | INHALATION_SOLUTION | Freq: Four times a day (QID) | RESPIRATORY_TRACT | Status: DC | PRN

## 2015-03-18 MED ORDER — FUROSEMIDE 10 MG/ML IJ SOLN
80.0000 mg | Freq: Two times a day (BID) | INTRAMUSCULAR | Status: DC
  Administered 2015-03-18 – 2015-03-19 (×2): 80 mg via INTRAVENOUS
  Filled 2015-03-18 (×2): qty 8

## 2015-03-18 MED ORDER — MAGNESIUM SULFATE 4 GM/100ML IV SOLN
4.0000 g | Freq: Once | INTRAVENOUS | Status: AC
  Administered 2015-03-18: 4 g via INTRAVENOUS
  Filled 2015-03-18: qty 100

## 2015-03-18 MED ORDER — DIGOXIN 125 MCG PO TABS
0.0625 mg | ORAL_TABLET | ORAL | Status: DC
  Administered 2015-03-20 – 2015-03-22 (×2): 0.0625 mg via ORAL
  Filled 2015-03-18 (×3): qty 1

## 2015-03-18 MED ORDER — POTASSIUM CHLORIDE CRYS ER 20 MEQ PO TBCR
40.0000 meq | EXTENDED_RELEASE_TABLET | Freq: Once | ORAL | Status: AC
  Administered 2015-03-18: 40 meq via ORAL
  Filled 2015-03-18: qty 2

## 2015-03-18 NOTE — Progress Notes (Signed)
    ADVANCED HF TEAM PROGRESS NOTE  He had  prolonged hospitalization in 9/16 for cardiogenic shock. AF with RVR and GI bleed. During that admission required dual inotropes (milrinone/levophed) but was able to be weaned. Failed DC-CV of AF and tolerated rate control.   He has iCM with EF 25-30%. Readmitted with recurrent Class IV HF symptoms with marked volume overload (weight up nearly 30 pounds from previous d/c) and AF with RVR 115-120. Has had little response to IV lasix. Renal function contiinues to deteriorate.   On milrinone and IV lasix. Diuresing briskly. -6L overnight. Weight down 16 pounds total.   Feeling better. Denies SOB/Orthopnea. + persistent LE edema. CVP 5. Co-ox 56%. Creatinine continues to improve 1.8-> 1.6. AF with improved rate control on amio.    Scheduled Meds: . atorvastatin  40 mg Oral q1800  . cefTRIAXone (ROCEPHIN)  IV  1 g Intravenous Q24H  . digoxin  0.0625 mg Oral Daily  . docusate sodium  100 mg Oral Daily  . doxycycline (VIBRAMYCIN) IV  100 mg Intravenous Q12H  . feeding supplement (ENSURE ENLIVE)  237 mL Oral BID BM  . ferrous sulfate  325 mg Oral TID WC  . furosemide  120 mg Intravenous BID  . insulin aspart  0-15 Units Subcutaneous TID WC  . insulin aspart  0-5 Units Subcutaneous QHS  . metolazone  5 mg Oral Daily  . mometasone-formoterol  2 puff Inhalation BID  . pantoprazole  40 mg Oral BID  . rivaroxaban  20 mg Oral Daily  . sodium chloride  10-40 mL Intracatheter Q12H   Continuous Infusions: . amiodarone 30 mg/hr (03/18/15 0800)  . milrinone 0.375 mcg/kg/min (03/18/15 0800)   PRN Meds:.levalbuterol, sodium chloride, traMADol  On exam Filed Vitals:   03/18/15 0745 03/18/15 0800  BP: 85/59   Pulse:    Temp: 97.3 F (36.3 C)   Resp: 22 25   PHYSICAL EXAM: CVP 5 General: NAD sitting in chair HEENT: normal Neck: supple. JVP5-6  . Carotids 2+ bilaterally; no bruits. No lymphadenopathy or thryomegaly appreciated. Cor: PMI normal.  Irregular rate & rhythm. No rubs, gallops or murmurs. Lungs: clear Abdomen: soft, nontender, nondistended. No hepatosplenomegaly. No bruits or masses. Good bowel sounds. Extremities: no cyanosis, clubbing, rash, R and LLE 2+ edema Neuro: alert & orientedx3, cranial nerves grossly intact. Moves all 4 extremities w/o difficulty. Affect pleasant.  EKG: A fib 90-100s Assessment: 1) Cardiogenic shock 2) A/c systolic HF 3) Acute respiratory failure 4) Cardiorenal syndrome with acute renal failure due to #1 5. Chronic AF - failed DC-CV on amio in 9/16 6. hypokalemia  Plan/discussion:  Improving with high-dose milrinone and IV diuresis but remains tenuous. CVP down but still with significant LE edema. Will decrease lasix to 80 IV bid. Place UNNA boots. Continue inotrope support. May need to consider home inotropes but social situation a challenge.  AF certainly complicating matters. Rate improved on digoxin and iv amio. Failed DC-CV in 9/16. Will likely need long-term amio for rate control. Continue Xarelto.  May need to consider AVN ablation with biV PPM if amio fails.   Supp K+. Can restart spiro.  Bensimhon, Daniel,MD 10:30 AM

## 2015-03-18 NOTE — Progress Notes (Signed)
Orthopedic Tech Progress Note Patient Details:  Jacob [name] 03/12/1956 [id number]  Ortho Devices Type of Ortho Device: Roland Rack boot Ortho Device/Splint Location: bilateral Ortho Device/Splint Interventions: Application   Rashmi Tallent 03/18/2015, 11:43 AM

## 2015-03-18 NOTE — Progress Notes (Addendum)
[facility] PROGRESS NOTE  Jacob [name] KCL:[id number] DOB: April 12, 1955 DOA: 03/14/2015 PCP: [name], MD  Length of stay: 4   Assessment/Plan: Active Problems:   Atrial fibrillation with RVR (HCC)   SOB (shortness of breath)   Morbid obesity due to excess calories (HCC)   Acute on chronic systolic (congestive) heart failure (HCC)   Cardiorenal syndrome with renal failure    # Acute on chronic systolic heart failure Continue Lasix 120 IV BID, continue Zaroxolyn, 5940 ml urine output overnight, cardiology added Zaroxolyn 12/21, creatinine improving after starting milrinone, likely has cardiorenal syndrome, Aldactone discontinued because of a potassium of 5.4 Continue digoxin,  milrinone, Zaroxolyn, Lasix IV, heart failure team following, started milrinone 12/22,  last admission 9/16, and patient required CVP monitoring, was found to be in cardiogenic shock and required vasopressors and milrinone Transferred to 2H SDU for IV milrinone and monitoring of CVPs and co-ox. On 12/22  # Long-standing persistent AF, poor rate control  - Continue xarelto ,CHADSVASC2 of 4 - No recent bleeds despite prior bright red blood --> Cardioversion is an option - However, his last documented sinus rhythm was 10/01/11; - prior attempt at rhythm control with amiodarone this summer was unsuccessful (see hospital notes 11/2014) Xarelto was held in setting of GI bleed and restarted on September 8 Patient also has an ICD and needs interrogation Continue dig 0.0625 mg , metoprolol discontinued by cardiology due to cardiogenic shock, started on amiodarone 12/23 May need AVN ablation with biV PPM?   3. Diabetes mellitus type II: His Hbg A1c in August 2016 is 8.1 Continue SSI Hold glipizide  4.COPD, stable, question right basilar infiltrate, continue Rocephin and doxycycline, day #4 of antibiotics. Repeat chest x-ray tomorrow  DVT prophylaxsis Xarelto  Code Status:    Family  Communication: Discussed in detail with the patient, all imaging results, lab results explained to the patient   Disposition Plan: Continue stepdown, cardiology making recommendations   Brief narrative: [name] with hx of HTN, HLD, DM2, CAD s/p 2V CABG w/ MAZE, ischemic cardiomyopathy EF 30% s/p [place] ICD ([employer]), long standing persistent atrial fibrillation, poorly rate-controlled (clinic HRs in December 130s and up), on xarelto, who presents in decompensated heart failure.  Per clinic note on 02/27/15 he had gained 10 pounds and his lasix was increased from 60 BID to 120 BID.  He states he missed his lasix dose this morning, but has had worsening HF over the past week, notably with increasing DOE (can only walk a few feet before SOB), orthopnea, and frequent PND.  He had one sustained episode of chest pressure that lasted hours this morning, but this has resolved and initial troponin is negative. He denies any salty meals, and states other than missing lasix today, he has been adherent to all meds, including Xarelto (adamant he takes daily).   Consultants:  Cardiology  Procedures:  None  Antibiotics: Anti-infectives    Start     Dose/Rate Route Frequency Ordered Stop   03/15/15 1200  cefTRIAXone (ROCEPHIN) 1 g in dextrose 5 % 50 mL IVPB     1 g 100 mL/hr over 30 Minutes Intravenous Every 24 hours 03/15/15 1056     03/15/15 1200  doxycycline (VIBRAMYCIN) 100 mg in dextrose 5 % 250 mL IVPB     100 mg 125 mL/hr over 120 Minutes Intravenous Every 12 hours 03/15/15 1056     03/15/15 0900  levofloxacin (LEVAQUIN) IVPB 750 mg  Status:  Discontinued  750 mg 100 mL/hr over 90 Minutes Intravenous Every 24 hours 03/15/15 0831 03/15/15 1056         HPI/Subjective: 1821 cc of urine output over 24 hours, on Lasix drip,Weight down 3 pounds  Objective: Filed Vitals:   03/17/15 2313 03/18/15 0317 03/18/15 0745 03/18/15 0800  BP:   Pulse: 83 80    Temp:  98.7 F (37.1 C) 98.5 F (36.9 C) 97.3 F (36.3 C)   TempSrc: Oral Oral Oral   Resp: Height:      Weight:  99.111 kg (218 lb 8 oz)    SpO2: 92% 94% 93%     Intake/Output Summary (Last 24 hours) at 03/18/15 1022 Last data filed at 03/18/15 0800  Gross per 24 hour  Intake 4754.67 ml  Output   4915 ml  Net -160.33 ml    Exam:  General: Chronically ill appearing. No resp difficulty. Arrived in a wheelchair HEENT: normal Neck: supple. JVP to jaw . Carotids 2+ bilaterally; no bruits. No lymphadenopathy or thryomegaly appreciated. Cor: PMI normal. Irregular rate & rhythm. No rubs, gallops or murmurs. Lungs: clear Abdomen: soft, nontender, nondistended. No hepatosplenomegaly. No bruits or masses. Good bowel sounds. Extremities: no cyanosis, clubbing, rash, R and LLE 1-2+ edema Neuro: alert & orientedx3, cranial nerves grossly intact. Moves all 4 extremities w/o difficulty. Affect pleasant.  Data Review   Micro Results Recent Results (from the past 240 hour(s))  MRSA PCR Screening     Status: None   Collection Time: 03/16/15  3:45 PM  Result Value Ref Range Status   MRSA by PCR NEGATIVE NEGATIVE Final    Comment:        The GeneXpert MRSA Assay (FDA approved for NASAL specimens only), is one component of a comprehensive MRSA colonization surveillance program. It is not intended to diagnose MRSA infection nor to guide or monitor treatment for MRSA infections.     Radiology Reports Dg Chest 2 View  03/14/2015  CLINICAL DATA:  Chest pain for 1 day EXAM: CHEST - 2 VIEW COMPARISON:  10/19/6 pain FINDINGS: Cardiac shadow is prominent. Postsurgical changes are again seen. A defibrillator is again noted. New right basilar infiltrate with associated effusion is seen. Mild vascular congestion is noted as well. IMPRESSION: Right basilar infiltrate with associated effusion as well as mild vascular congestion. Electronically Signed   By: Alcide Clever M.D.   On:  03/14/2015 20:34   Dg Chest Port 1 View  03/16/2015  CLINICAL DATA:  Central line placement EXAM: PORTABLE CHEST 1 VIEW COMPARISON:  03/14/2015 FINDINGS: Cardiomediastinal silhouette is stable. Status post median sternotomy. Single lead cardiac pacemaker is unchanged in position. Persistent small right pleural effusion with right basilar atelectasis or infiltrate. Central mild vascular congestion. There is a right arm/right subclavian central line with tip in the lower SVC. Please note the tip of the central line is not clearly identified due to patient's large body habitus and overlying chest wall artifacts. There is no pneumothorax. IMPRESSION: Persistent small right pleural effusion with right basilar atelectasis or infiltrate. Central mild vascular congestion. There is a right arm/right subclavian central line with tip in the lower SVC. Please note the tip of the central line is not clearly identified due to patient's large body habitus and overlying chest wall artifacts. There is no pneumothorax. Electronically Signed   By: Natasha Mead M.D.   On: 03/16/2015 15:52     CBC  Recent Labs Lab 03/14/15 1920  03/15/15 0440 03/16/15 0340 03/17/15 0431  WBC 8.0 8.8 9.2 9.1  HGB 9.7* 9.8* 10.5* 10.0*  HCT 37.4* 38.0* 39.0 35.8*  PLT 287 255 308 285  MCV 70.7* 71.0* 69.9* 68.5*  MCH 18.3* 18.3* 18.8* 19.1*  MCHC 25.9* 25.8* 26.9* 27.9*  RDW 23.3* 23.1* 23.6* 23.6*    Chemistries   Recent Labs Lab 03/14/15 1920 03/15/15 0440 03/16/15 0025 03/16/15 0340 03/17/15 0431 03/18/15 0430  NA 134* 134*  --  130* 130* 128*  K 5.2* 4.3  --  5.4* 4.1 3.4*  CL 98* 95*  --  91* 90* 85*  CO2 27 29  --  28 31 32  GLUCOSE 319* 188*  --  170* 246* 276*  BUN 24* 25*  --  33* 39* 38*  CREATININE 1.17 1.25*  --  1.68* 1.85* 1.68*  CALCIUM 8.6* 8.9  --  8.6* 8.3* 8.3*  MG  --   --  1.8  --   --  1.5*  AST  --   --   --  ALT  --   --   --  15* 13* 14*  ALKPHOS  --   --   --  152* 155* 130*   BILITOT  --   --   --  2.0* 1.7* 1.5*   ------------------------------------------------------------------------------------------------------------------ estimated creatinine clearance is 51.9 mL/min (by C-G formula based on Cr of 1.68). ------------------------------------------------------------------------------------------------------------------ No results for input(s): HGBA1C in the last 72 hours. ------------------------------------------------------------------------------------------------------------------ No results for input(s): CHOL, HDL, LDLCALC, TRIG, CHOLHDL, LDLDIRECT in the last 72 hours. ------------------------------------------------------------------------------------------------------------------  Recent Labs  03/18/15 0430  TSH 4.690*   ------------------------------------------------------------------------------------------------------------------ No results for input(s): VITAMINB12, FOLATE, FERRITIN, TIBC, IRON, RETICCTPCT in the last 72 hours.  Coagulation profile  Recent Labs Lab 03/14/15 1920  INR 1.59*    No results for input(s): DDIMER in the last 72 hours.  Cardiac Enzymes No results for input(s): CKMB, TROPONINI, MYOGLOBIN in the last 168 hours.  Invalid input(s): CK ------------------------------------------------------------------------------------------------------------------ Invalid input(s): POCBNP   CBG:  Recent Labs Lab 03/17/15 0738 03/17/15 1203 03/17/15 1642 03/17/15 2110 03/18/15 0753  GLUCAP 187* 245* 181* 284* 241*       Studies: Dg Chest Port 1 View  03/16/2015  CLINICAL DATA:  Central line placement EXAM: PORTABLE CHEST 1 VIEW COMPARISON:  03/14/2015 FINDINGS: Cardiomediastinal silhouette is stable. Status post median sternotomy. Single lead cardiac pacemaker is unchanged in position. Persistent small right pleural effusion with right basilar atelectasis or infiltrate. Central mild vascular congestion. There is  a right arm/right subclavian central line with tip in the lower SVC. Please note the tip of the central line is not clearly identified due to patient's large body habitus and overlying chest wall artifacts. There is no pneumothorax. IMPRESSION: Persistent small right pleural effusion with right basilar atelectasis or infiltrate. Central mild vascular congestion. There is a right arm/right subclavian central line with tip in the lower SVC. Please note the tip of the central line is not clearly identified due to patient's large body habitus and overlying chest wall artifacts. There is no pneumothorax. Electronically Signed   By: Natasha Mead M.D.   On: 03/16/2015 15:52      Lab Results  Component Value Date   HGBA1C 8.8* 03/15/2015   HGBA1C 6.9 12/22/2014   HGBA1C 8.1* 11/12/2014   Lab Results  Component Value Date   LDLCALC 52 08/05/2014   CREATININE 1.68* 03/18/2015       Scheduled Meds: . atorvastatin  40 mg Oral q1800  . cefTRIAXone (ROCEPHIN)  IV  1 g Intravenous Q24H  . digoxin  0.0625 mg Oral Daily  . docusate sodium  100 mg Oral Daily  . doxycycline (VIBRAMYCIN) IV  100 mg Intravenous Q12H  . feeding supplement (ENSURE ENLIVE)  237 mL Oral BID BM  . ferrous sulfate  325 mg Oral TID WC  . furosemide  120 mg Intravenous BID  . insulin aspart  0-15 Units Subcutaneous TID WC  . insulin aspart  0-5 Units Subcutaneous QHS  . metolazone  5 mg Oral Daily  . mometasone-formoterol  2 puff Inhalation BID  . pantoprazole  40 mg Oral BID  . rivaroxaban  20 mg Oral Daily  . sodium chloride  10-40 mL Intracatheter Q12H   Continuous Infusions: . amiodarone 30 mg/hr (03/18/15 0800)  . milrinone 0.375 mcg/kg/min (03/18/15 0800)    Active Problems:   Atrial fibrillation with RVR (HCC)   SOB (shortness of breath)   Morbid obesity due to excess calories (HCC)   Acute on chronic systolic (congestive) heart failure (HCC)   Cardiorenal syndrome with renal failure    Time spent: 45  minutes   [facility]  [facility] Pager [phone]. If 7PM-7AM, please contact night-coverage at [email or web address], password [email or web address] 03/18/2015, 10:22 AM  LOS: 4 days

## 2015-03-19 ENCOUNTER — Inpatient Hospital Stay (HOSPITAL_COMMUNITY): Payer: Medicare Other

## 2015-03-19 LAB — GLUCOSE, CAPILLARY
GLUCOSE-CAPILLARY: 275 mg/dL — AB (ref 65–99)
GLUCOSE-CAPILLARY: 286 mg/dL — AB (ref 65–99)
Glucose-Capillary: 225 mg/dL — ABNORMAL HIGH (ref 65–99)
Glucose-Capillary: 235 mg/dL — ABNORMAL HIGH (ref 65–99)
Glucose-Capillary: 238 mg/dL — ABNORMAL HIGH (ref 65–99)

## 2015-03-19 LAB — CBC
HCT: 37 % — ABNORMAL LOW (ref 39.0–52.0)
HEMOGLOBIN: 10.4 g/dL — AB (ref 13.0–17.0)
MCH: 19.2 pg — AB (ref 26.0–34.0)
MCHC: 28.1 g/dL — ABNORMAL LOW (ref 30.0–36.0)
MCV: 68.1 fL — AB (ref 78.0–100.0)
Platelets: 283 10*3/uL (ref 150–400)
RBC: 5.43 MIL/uL (ref 4.22–5.81)
RDW: 24.6 % — ABNORMAL HIGH (ref 11.5–15.5)
WBC: 9.7 10*3/uL (ref 4.0–10.5)

## 2015-03-19 LAB — BASIC METABOLIC PANEL
ANION GAP: 12 (ref 5–15)
BUN: 38 mg/dL — ABNORMAL HIGH (ref 6–20)
CHLORIDE: 82 mmol/L — AB (ref 101–111)
CO2: 32 mmol/L (ref 22–32)
CREATININE: 1.81 mg/dL — AB (ref 0.61–1.24)
Calcium: 8.4 mg/dL — ABNORMAL LOW (ref 8.9–10.3)
GFR calc non Af Amer: 40 mL/min — ABNORMAL LOW (ref >60)
GFR, EST AFRICAN AMERICAN: 46 mL/min — AB (ref >60)
Glucose, Bld: 259 mg/dL — ABNORMAL HIGH (ref 65–99)
Potassium: 4.3 mmol/L (ref 3.5–5.1)
Sodium: 126 mmol/L — ABNORMAL LOW (ref 135–145)

## 2015-03-19 LAB — CARBOXYHEMOGLOBIN
Carboxyhemoglobin: 2.4 % — ABNORMAL HIGH (ref 0.5–1.5)
Methemoglobin: 0.9 % (ref 0.0–1.5)
O2 Saturation: 65 %
Total hemoglobin: 10.7 g/dL — ABNORMAL LOW (ref 13.5–18.0)

## 2015-03-19 LAB — MAGNESIUM: Magnesium: 2.1 mg/dL (ref 1.7–2.4)

## 2015-03-19 MED ORDER — DEXTROSE 5 % IV SOLN
300.0000 mg | Freq: Once | INTRAVENOUS | Status: DC
  Filled 2015-03-19: qty 6

## 2015-03-19 MED ORDER — AMIODARONE LOAD VIA INFUSION
300.0000 mg | Freq: Once | INTRAVENOUS | Status: AC
  Administered 2015-03-19: 300 mg via INTRAVENOUS
  Filled 2015-03-19: qty 166.67

## 2015-03-19 MED ORDER — FUROSEMIDE 80 MG PO TABS
80.0000 mg | ORAL_TABLET | Freq: Two times a day (BID) | ORAL | Status: DC
  Administered 2015-03-20 – 2015-03-23 (×6): 80 mg via ORAL
  Filled 2015-03-19 (×7): qty 1

## 2015-03-19 NOTE — Progress Notes (Signed)
[facility] PROGRESS NOTE  Jacob [name] NWG:[id number] DOB: [birth year] DOA: 03/14/2015 PCP: [name], MD  Length of stay: 5   Assessment/Plan: Active Problems:   Atrial fibrillation with RVR (HCC)   SOB (shortness of breath)   Morbid obesity due to excess calories (HCC)   Acute on chronic systolic (congestive) heart failure (HCC)   Cardiorenal syndrome with renal failure    # Acute on chronic systolic heart failure Continue Lasix 120 IV BID, continue Zaroxolyn, 5940 ml urine output overnight, cardiology added Zaroxolyn 12/21, creatinine improving after starting milrinone, likely has cardiorenal syndrome,  Restarted Aldactone 12/25 Continue digoxin,  milrinone, Zaroxolyn, Lasix IV, heart failure team following, started milrinone 12/22,  last admission 9/16, and patient required CVP monitoring, was found to be in cardiogenic shock and required vasopressors and milrinone Transferred to 2H SDU for IV milrinone and monitoring of CVPs and co-ox. On 12/22  Hyponatremia-sodium 134> 126, likely in the setting of heart failure, Zaroxolyn, diuresis,  # Long-standing persistent AF, poor rate control  - Continue xarelto ,CHADSVASC2 of 4 - No recent bleeds despite prior bright red blood --> Cardioversion is an option - However, his last documented sinus rhythm was 10/01/11; - prior attempt at rhythm control with amiodarone this summer was unsuccessful (see hospital notes 11/2014) Xarelto was held in setting of GI bleed and restarted on September 8 Patient also has an ICD and needs interrogation Continue dig 0.0625 mg , amiodarone 12/23  metoprolol discontinued by cardiology due to cardiogenic shock,   May need to consider AVN ablation with biV PPM if amio fails.    3. Diabetes mellitus type II: His Hbg A1c in August 2016 is 8.1 Continue SSI Hold glipizide  4.COPD, stable, question right basilar infiltrate, discontinue Rocephin and doxycycline, completed 5 days of  antibiotics. Repeat chest x-ray shows moderate CHF, no pneumonia  DVT prophylaxsis Xarelto  Code Status:    Family Communication: Discussed in detail with the patient, all imaging results, lab results explained to the patient   Disposition Plan: Continue stepdown, cardiology making recommendations   Brief narrative: [name] with hx of HTN, HLD, DM2, CAD s/p 2V CABG w/ MAZE, ischemic cardiomyopathy EF 30% s/p [place] ICD ([employer]), long standing persistent atrial fibrillation, poorly rate-controlled (clinic HRs in December 130s and up), on xarelto, who presents in decompensated heart failure.  Per clinic note on 02/27/15 he had gained 10 pounds and his lasix was increased from 60 BID to 120 BID.  He states he missed his lasix dose this morning, but has had worsening HF over the past week, notably with increasing DOE (can only walk a few feet before SOB), orthopnea, and frequent PND.  He had one sustained episode of chest pressure that lasted hours this morning, but this has resolved and initial troponin is negative. He denies any salty meals, and states other than missing lasix today, he has been adherent to all meds, including Xarelto (adamant he takes daily).   Consultants:  Cardiology  Procedures:  None  Antibiotics: Anti-infectives    Start     Dose/Rate Route Frequency Ordered Stop   03/15/15 1200  cefTRIAXone (ROCEPHIN) 1 g in dextrose 5 % 50 mL IVPB     1 g 100 mL/hr over 30 Minutes Intravenous Every 24 hours 03/15/15 1056     03/15/15 1200  doxycycline (VIBRAMYCIN) 100 mg in dextrose 5 % 250 mL IVPB     100 mg 125 mL/hr over 120 Minutes Intravenous Every  12 hours 03/15/15 1056     03/15/15 0900  levofloxacin (LEVAQUIN) IVPB 750 mg  Status:  Discontinued     750 mg 100 mL/hr over 90 Minutes Intravenous Every 24 hours 03/15/15 0831 03/15/15 1056         HPI/Subjective: Feeling better. Denies SOB/Orthopnea. + persistent LE edema  Objective: Filed Vitals:    03/19/15 0405 03/19/15 0629 03/19/15 0704 03/19/15 0832  BP:   110/70   Pulse:      Temp: 97.8 F (36.6 C)  97.9 F (36.6 C)   TempSrc: Oral  Oral   Resp:   25   Height:      Weight:  96.1 kg (211 lb 13.8 oz)    SpO2:   96% 98%    Intake/Output Summary (Last 24 hours) at 03/19/15 1025 Last data filed at 03/19/15 0900  Gross per 24 hour  Intake 1916.4 ml  Output   4900 ml  Net -2983.6 ml    Exam:  General: Chronically ill appearing. No resp difficulty. Arrived in a wheelchair HEENT: normal Neck: supple. JVP to jaw . Carotids 2+ bilaterally; no bruits. No lymphadenopathy or thryomegaly appreciated. Cor: PMI normal. Irregular rate & rhythm. No rubs, gallops or murmurs. Lungs: clear Abdomen: soft, nontender, nondistended. No hepatosplenomegaly. No bruits or masses. Good bowel sounds. Extremities: no cyanosis, clubbing, rash, R and LLE 1-2+ edema Neuro: alert & orientedx3, cranial nerves grossly intact. Moves all 4 extremities w/o difficulty. Affect pleasant.  Data Review   Micro Results Recent Results (from the past 240 hour(s))  MRSA PCR Screening     Status: None   Collection Time: 03/16/15  3:45 PM  Result Value Ref Range Status   MRSA by PCR NEGATIVE NEGATIVE Final    Comment:        The GeneXpert MRSA Assay (FDA approved for NASAL specimens only), is one component of a comprehensive MRSA colonization surveillance program. It is not intended to diagnose MRSA infection nor to guide or monitor treatment for MRSA infections.     Radiology Reports Dg Chest 2 View  03/19/2015  CLINICAL DATA:  Shortness of breath and weakness EXAM: CHEST  2 VIEW COMPARISON:  03/16/2015 FINDINGS: There is a left chest wall ICD with lead in the right ventricle. The right arm PICC line tip is in the cavoatrial junction. Previous median sternotomy and CABG procedure. Bilateral pleural effusions are noted right greater in left. There is mild diffuse pulmonary edema identified.  IMPRESSION: 1. Moderate CHF.  Unchanged from previous exam. Electronically Signed   By: Signa Kell M.D.   On: 03/19/2015 09:41   Dg Chest 2 View  03/14/2015  CLINICAL DATA:  Chest pain for 1 day EXAM: CHEST - 2 VIEW COMPARISON:  10/19/6 pain FINDINGS: Cardiac shadow is prominent. Postsurgical changes are again seen. A defibrillator is again noted. New right basilar infiltrate with associated effusion is seen. Mild vascular congestion is noted as well. IMPRESSION: Right basilar infiltrate with associated effusion as well as mild vascular congestion. Electronically Signed   By: Alcide Clever M.D.   On: 03/14/2015 20:34   Dg Chest Port 1 View  03/16/2015  CLINICAL DATA:  Central line placement EXAM: PORTABLE CHEST 1 VIEW COMPARISON:  03/14/2015 FINDINGS: Cardiomediastinal silhouette is stable. Status post median sternotomy. Single lead cardiac pacemaker is unchanged in position. Persistent small right pleural effusion with right basilar atelectasis or infiltrate. Central mild vascular congestion. There is a right arm/right subclavian central line with tip in the lower  SVC. Please note the tip of the central line is not clearly identified due to patient's large body habitus and overlying chest wall artifacts. There is no pneumothorax. IMPRESSION: Persistent small right pleural effusion with right basilar atelectasis or infiltrate. Central mild vascular congestion. There is a right arm/right subclavian central line with tip in the lower SVC. Please note the tip of the central line is not clearly identified due to patient's large body habitus and overlying chest wall artifacts. There is no pneumothorax. Electronically Signed   By: Natasha Mead M.D.   On: 03/16/2015 15:52     CBC  Recent Labs Lab 03/14/15 1920 03/15/15 0440 03/16/15 0340 03/17/15 0431 03/19/15 0425  WBC 8.0 8.8 9.2 9.1 9.7  HGB 9.7* 9.8* 10.5* 10.0* 10.4*  HCT 37.4* 38.0* 39.0 35.8* 37.0*  PLT 287 255 308 285 283  MCV 70.7* 71.0*  69.9* 68.5* 68.1*  MCH 18.3* 18.3* 18.8* 19.1* 19.2*  MCHC 25.9* 25.8* 26.9* 27.9* 28.1*  RDW 23.3* 23.1* 23.6* 23.6* 24.6*    Chemistries   Recent Labs Lab 03/15/15 0440 03/16/15 0025 03/16/15 0340 03/17/15 0431 03/18/15 0430 03/19/15 0425  NA 134*  --  130* 130* 128* 126*  K 4.3  --  5.4* 4.1 3.4* 4.3  CL 95*  --  91* 90* 85* 82*  CO2 29  --  28 31 32 32  GLUCOSE 188*  --  170* 246* 276* 259*  BUN 25*  --  33* 39* 38* 38*  CREATININE 1.25*  --  1.68* 1.85* 1.68* 1.81*  CALCIUM 8.9  --  8.6* 8.3* 8.3* 8.4*  MG  --  1.8  --   --  1.5* 2.1  AST  --   --  23 22 16   --   ALT  --   --  15* 13* 14*  --   ALKPHOS  --   --  152* 155* 130*  --   BILITOT  --   --  2.0* 1.7* 1.5*  --    ------------------------------------------------------------------------------------------------------------------ estimated creatinine clearance is 47.4 mL/min (by C-G formula based on Cr of 1.81). ------------------------------------------------------------------------------------------------------------------ No results for input(s): HGBA1C in the last 72 hours. ------------------------------------------------------------------------------------------------------------------ No results for input(s): CHOL, HDL, LDLCALC, TRIG, CHOLHDL, LDLDIRECT in the last 72 hours. ------------------------------------------------------------------------------------------------------------------  Recent Labs  03/18/15 0430  TSH 4.690*   ------------------------------------------------------------------------------------------------------------------ No results for input(s): VITAMINB12, FOLATE, FERRITIN, TIBC, IRON, RETICCTPCT in the last 72 hours.  Coagulation profile  Recent Labs Lab 03/14/15 1920  INR 1.59*    No results for input(s): DDIMER in the last 72 hours.  Cardiac Enzymes No results for input(s): CKMB, TROPONINI, MYOGLOBIN in the last 168 hours.  Invalid input(s):  CK ------------------------------------------------------------------------------------------------------------------ Invalid input(s): POCBNP   CBG:  Recent Labs Lab 03/18/15 0753 03/18/15 1143 03/18/15 1628 03/18/15 2148 03/19/15 0707  GLUCAP 241* 178* 222* 225* 235*       Studies: Dg Chest 2 View  03/19/2015  CLINICAL DATA:  Shortness of breath and weakness EXAM: CHEST  2 VIEW COMPARISON:  03/16/2015 FINDINGS: There is a left chest wall ICD with lead in the right ventricle. The right arm PICC line tip is in the cavoatrial junction. Previous median sternotomy and CABG procedure. Bilateral pleural effusions are noted right greater in left. There is mild diffuse pulmonary edema identified. IMPRESSION: 1. Moderate CHF.  Unchanged from previous exam. Electronically Signed   By: Signa Kell M.D.   On: 03/19/2015 09:41      Lab Results  Component  Value Date   HGBA1C 8.8* 03/15/2015   HGBA1C 6.9 12/22/2014   HGBA1C 8.1* 11/12/2014   Lab Results  Component Value Date   LDLCALC 52 08/05/2014   CREATININE 1.81* 03/19/2015       Scheduled Meds: . atorvastatin  40 mg Oral q1800  . cefTRIAXone (ROCEPHIN)  IV  1 g Intravenous Q24H  . [START ON 03/20/2015] digoxin  0.0625 mg Oral Q48H  . docusate sodium  100 mg Oral Daily  . doxycycline (VIBRAMYCIN) IV  100 mg Intravenous Q12H  . feeding supplement (ENSURE ENLIVE)  237 mL Oral BID BM  . ferrous sulfate  325 mg Oral TID WC  . furosemide  80 mg Intravenous BID  . insulin aspart  0-15 Units Subcutaneous TID WC  . insulin aspart  0-5 Units Subcutaneous QHS  . metolazone  5 mg Oral Daily  . mometasone-formoterol  2 puff Inhalation BID  . pantoprazole  40 mg Oral BID  . rivaroxaban  20 mg Oral Daily  . sodium chloride  10-40 mL Intracatheter Q12H  . spironolactone  25 mg Oral Daily   Continuous Infusions: . amiodarone 30 mg/hr (03/19/15 0800)  . milrinone 0.375 mcg/kg/min (03/19/15 0800)    Active Problems:   Atrial  fibrillation with RVR (HCC)   SOB (shortness of breath)   Morbid obesity due to excess calories (HCC)   Acute on chronic systolic (congestive) heart failure (HCC)   Cardiorenal syndrome with renal failure    Time spent: 45 minutes   [facility]  [facility] Pager [phone]. If 7PM-7AM, please contact night-coverage at [email or web address], password [facility] 03/19/2015, 10:25 AM  LOS: 5 days

## 2015-03-19 NOTE — Progress Notes (Signed)
ADVANCED HF TEAM PROGRESS NOTE  He had  prolonged hospitalization in 9/16 for cardiogenic shock. AF with RVR and GI bleed. During that admission required dual inotropes (milrinone/levophed) but was able to be weaned. Failed DC-CV of AF and tolerated rate control.   He has iCM with EF 25-30%. Readmitted with recurrent Class IV HF symptoms with marked volume overload (weight up nearly 30 pounds from previous d/c) and AF with RVR 115-120. Has had little response to IV lasix. Renal function contiinues to deteriorate.   On milrinone and IV lasix. Diuresing briskly. Weight down another 7 pounds. 23 pounds total.   Feeling better. Denies SOB/Orthopnea. Unna boots applied yesterday. He doesn't like them. Wants them off.  CVP 4 (checked personally). Co-ox 65%. Creatinine up a bit 1.6-> 1.8 AF still fast despite amio. 110-120.     Scheduled Meds: . atorvastatin  40 mg Oral q1800  . [START ON 03/20/2015] digoxin  0.0625 mg Oral Q48H  . docusate sodium  100 mg Oral Daily  . feeding supplement (ENSURE ENLIVE)  237 mL Oral BID BM  . ferrous sulfate  325 mg Oral TID WC  . furosemide  80 mg Intravenous BID  . insulin aspart  0-15 Units Subcutaneous TID WC  . insulin aspart  0-5 Units Subcutaneous QHS  . metolazone  5 mg Oral Daily  . mometasone-formoterol  2 puff Inhalation BID  . pantoprazole  40 mg Oral BID  . rivaroxaban  20 mg Oral Daily  . sodium chloride  10-40 mL Intracatheter Q12H  . spironolactone  25 mg Oral Daily   Continuous Infusions: . amiodarone 30 mg/hr (03/19/15 0800)  . milrinone 0.375 mcg/kg/min (03/19/15 0800)   PRN Meds:.levalbuterol, sodium chloride, traMADol  On exam Filed Vitals:   03/19/15 0704 03/19/15 1138  BP: 110/70 94/74  Pulse:    Temp: 97.9 F (36.6 C) 98.1 F (36.7 C)  Resp: 25 22   PHYSICAL EXAM: CVP 4 General: NAD sitting in chair HEENT: normal Neck: supple. JVP 5-6  . Carotids 2+ bilaterally; no bruits. No lymphadenopathy or thryomegaly  appreciated. Cor: PMI normal. Irregular rate & rhythm. Tachy No rubs, gallops or murmurs. Lungs: clear Abdomen: soft, nontender, nondistended. No hepatosplenomegaly. No bruits or masses. Good bowel sounds. Extremities: no cyanosis, clubbing, rash, UNNA boots with 1+ edema Neuro: alert & orientedx3, cranial nerves grossly intact. Moves all 4 extremities w/o difficulty. Affect pleasant.  EKG: A fib 90-100s  Results for orders placed or performed during the hospital encounter of 03/14/15 (from the past 24 hour(s))  Glucose, capillary     Status: Abnormal   Collection Time: 03/18/15  4:28 PM  Result Value Ref Range   Glucose-Capillary 222 (H) 65 - 99 mg/dL   Comment 1 Capillary Specimen   Glucose, capillary     Status: Abnormal   Collection Time: 03/18/15  9:48 PM  Result Value Ref Range   Glucose-Capillary 225 (H) 65 - 99 mg/dL  Carboxyhemoglobin     Status: Abnormal   Collection Time: 03/19/15 12:39 AM  Result Value Ref Range   Total hemoglobin 10.7 (L) 13.5 - 18.0 g/dL   O2 Saturation 37.1 %   Carboxyhemoglobin 2.4 (H) 0.5 - 1.5 %   Methemoglobin 0.9 0.0 - 1.5 %  Basic metabolic panel     Status: Abnormal   Collection Time: 03/19/15  4:25 AM  Result Value Ref Range   Sodium 126 (L) 135 - 145 mmol/L   Potassium 4.3 3.5 - 5.1 mmol/L  Chloride 82 (L) 101 - 111 mmol/L   CO2 32 22 - 32 mmol/L   Glucose, Bld 259 (H) 65 - 99 mg/dL   BUN 38 (H) 6 - 20 mg/dL   Creatinine, Ser 1.61 (H) 0.61 - 1.24 mg/dL   Calcium 8.4 (L) 8.9 - 10.3 mg/dL   GFR calc non Af Amer 40 (L) >60 mL/min   GFR calc Af Amer 46 (L) >60 mL/min   Anion gap 12 5 - 15  CBC     Status: Abnormal   Collection Time: 03/19/15  4:25 AM  Result Value Ref Range   WBC 9.7 4.0 - 10.5 K/uL   RBC 5.43 4.22 - 5.81 MIL/uL   Hemoglobin 10.4 (L) 13.0 - 17.0 g/dL   HCT 09.6 (L) 04.5 - 40.9 %   MCV 68.1 (L) 78.0 - 100.0 fL   MCH 19.2 (L) 26.0 - 34.0 pg   MCHC 28.1 (L) 30.0 - 36.0 g/dL   RDW 81.1 (H) 91.4 - 78.2 %   Platelets  283 150 - 400 K/uL  Magnesium     Status: None   Collection Time: 03/19/15  4:25 AM  Result Value Ref Range   Magnesium 2.1 1.7 - 2.4 mg/dL  Glucose, capillary     Status: Abnormal   Collection Time: 03/19/15  7:07 AM  Result Value Ref Range   Glucose-Capillary 235 (H) 65 - 99 mg/dL   Comment 1 Capillary Specimen   Glucose, capillary     Status: Abnormal   Collection Time: 03/19/15 11:41 AM  Result Value Ref Range   Glucose-Capillary 275 (H) 65 - 99 mg/dL   Comment 1 Capillary Specimen      Assessment: 1) Cardiogenic shock 2) A/c systolic HF 3) Acute respiratory failure 4) Cardiorenal syndrome with acute renal failure due to #1 5) Chronic AF - failed DC-CV on amio in 9/16 6) Hypokalemia 7) Hyponatremia  Plan/discussion:  CVP checked personally = 4. Volume status improved with high-dose milrinone and IV diuresis. Will switch to po diuretics. Co-ox improved but renal function slightly worse. Will continue milrinone today and try to wean tomorrow.May need to consider home inotropes but social situation a challenge. K ok. Sodium low. No ACE/ARB or b-blocker due to shock.   AF certainly complicating matters. Rate still fast despite digoxin and iv amio. Will give another amio bolus. Failed DC-CV in 9/16. Will likely need long-term amio for rate control.   May need to consider AVN ablation with biV PPM if amio fails.  Continue Xarelto.  Will remove UNNA boots and place TED hose.   Alecsander Hattabaugh,MD 11:42 AM

## 2015-03-20 LAB — GLUCOSE, CAPILLARY
GLUCOSE-CAPILLARY: 247 mg/dL — AB (ref 65–99)
Glucose-Capillary: 240 mg/dL — ABNORMAL HIGH (ref 65–99)
Glucose-Capillary: 133 mg/dL — ABNORMAL HIGH (ref 65–99)
Glucose-Capillary: 260 mg/dL — ABNORMAL HIGH (ref 65–99)

## 2015-03-20 LAB — COMPREHENSIVE METABOLIC PANEL
ALBUMIN: 3 g/dL — AB (ref 3.5–5.0)
ALK PHOS: 158 U/L — AB (ref 38–126)
ALT: 12 U/L — AB (ref 17–63)
ANION GAP: 10 (ref 5–15)
AST: 21 U/L (ref 15–41)
BILIRUBIN TOTAL: 1.5 mg/dL — AB (ref 0.3–1.2)
BUN: 34 mg/dL — ABNORMAL HIGH (ref 6–20)
CALCIUM: 8.3 mg/dL — AB (ref 8.9–10.3)
CO2: 31 mmol/L (ref 22–32)
CREATININE: 1.64 mg/dL — AB (ref 0.61–1.24)
Chloride: 85 mmol/L — ABNORMAL LOW (ref 101–111)
GFR calc non Af Amer: 45 mL/min — ABNORMAL LOW (ref >60)
GFR, EST AFRICAN AMERICAN: 52 mL/min — AB (ref >60)
GLUCOSE: 410 mg/dL — AB (ref 65–99)
Potassium: 4.6 mmol/L (ref 3.5–5.1)
Sodium: 126 mmol/L — ABNORMAL LOW (ref 135–145)
TOTAL PROTEIN: 6.9 g/dL (ref 6.5–8.1)

## 2015-03-20 LAB — CARBOXYHEMOGLOBIN
Carboxyhemoglobin: 2.4 % — ABNORMAL HIGH (ref 0.5–1.5)
Methemoglobin: 0.9 % (ref 0.0–1.5)
O2 SAT: 59.7 %
TOTAL HEMOGLOBIN: 10.4 g/dL — AB (ref 13.5–18.0)

## 2015-03-20 MED ORDER — CARVEDILOL 3.125 MG PO TABS
3.1250 mg | ORAL_TABLET | Freq: Two times a day (BID) | ORAL | Status: DC
  Administered 2015-03-20 – 2015-03-23 (×6): 3.125 mg via ORAL
  Filled 2015-03-20 (×7): qty 1

## 2015-03-20 MED ORDER — GLIPIZIDE 10 MG PO TABS
10.0000 mg | ORAL_TABLET | Freq: Two times a day (BID) | ORAL | Status: DC
  Filled 2015-03-20 (×3): qty 1

## 2015-03-20 MED ORDER — ISOSORB DINITRATE-HYDRALAZINE 20-37.5 MG PO TABS
0.5000 | ORAL_TABLET | Freq: Three times a day (TID) | ORAL | Status: DC
  Administered 2015-03-20 – 2015-03-21 (×5): 0.5 via ORAL
  Filled 2015-03-20 (×6): qty 1

## 2015-03-20 MED ORDER — AMIODARONE HCL 200 MG PO TABS
200.0000 mg | ORAL_TABLET | Freq: Two times a day (BID) | ORAL | Status: DC
  Administered 2015-03-20 – 2015-03-23 (×7): 200 mg via ORAL
  Filled 2015-03-20 (×7): qty 1

## 2015-03-20 MED ORDER — INSULIN ASPART 100 UNIT/ML ~~LOC~~ SOLN
0.0000 [IU] | Freq: Three times a day (TID) | SUBCUTANEOUS | Status: DC
  Administered 2015-03-20: 2 [IU] via SUBCUTANEOUS
  Administered 2015-03-20 (×2): 5 [IU] via SUBCUTANEOUS
  Administered 2015-03-21: 3 [IU] via SUBCUTANEOUS
  Administered 2015-03-21: 5 [IU] via SUBCUTANEOUS
  Administered 2015-03-21: 3 [IU] via SUBCUTANEOUS
  Administered 2015-03-22: 2 [IU] via SUBCUTANEOUS
  Administered 2015-03-22: 15 [IU] via SUBCUTANEOUS
  Administered 2015-03-23: 3 [IU] via SUBCUTANEOUS
  Administered 2015-03-23: 2 [IU] via SUBCUTANEOUS

## 2015-03-20 MED ORDER — GLIPIZIDE 5 MG PO TABS
5.0000 mg | ORAL_TABLET | Freq: Two times a day (BID) | ORAL | Status: DC
  Administered 2015-03-20 – 2015-03-22 (×5): 5 mg via ORAL
  Filled 2015-03-20 (×7): qty 1

## 2015-03-20 MED ORDER — INSULIN GLARGINE 100 UNIT/ML ~~LOC~~ SOLN
10.0000 [IU] | Freq: Once | SUBCUTANEOUS | Status: AC
  Administered 2015-03-20: 10 [IU] via SUBCUTANEOUS
  Filled 2015-03-20: qty 0.1

## 2015-03-20 MED ORDER — FERROUS SULFATE 325 (65 FE) MG PO TABS
325.0000 mg | ORAL_TABLET | Freq: Three times a day (TID) | ORAL | Status: DC
  Administered 2015-03-20 – 2015-03-23 (×10): 325 mg via ORAL
  Filled 2015-03-20 (×10): qty 1

## 2015-03-20 NOTE — Progress Notes (Signed)
ADVANCED HF TEAM PROGRESS NOTE  He had  prolonged hospitalization in 9/16 for cardiogenic shock. AF with RVR and GI bleed. During that admission required dual inotropes (milrinone/levophed) but was able to be weaned. Failed DC-CV of AF and tolerated rate control.   He has iCM with EF 25-30%. Readmitted with recurrent Class IV HF symptoms with marked volume overload (weight up nearly 30 pounds from previous d/c) and AF with RVR 115-120. Has had little response to IV lasix. Renal function contiinues to deteriorate.   Remains on milrinone 0.375. Switched to po diuretics yesterday. Weight down another 3 pounds. Creatinine slightly improved. Co-ox 60%. UNNA boots removed at his request. Now with TED hose.  Denies SOB/Orthopnea. ]  AF still fast despite amio. 110-120.     Scheduled Meds: . atorvastatin  40 mg Oral q1800  . digoxin  0.0625 mg Oral Q48H  . docusate sodium  100 mg Oral Daily  . feeding supplement (ENSURE ENLIVE)  237 mL Oral BID BM  . ferrous sulfate  325 mg Oral TID WC  . furosemide  80 mg Oral BID  . insulin aspart  0-15 Units Subcutaneous TID WC  . insulin aspart  0-5 Units Subcutaneous QHS  . mometasone-formoterol  2 puff Inhalation BID  . pantoprazole  40 mg Oral BID  . rivaroxaban  20 mg Oral Daily  . sodium chloride  10-40 mL Intracatheter Q12H  . spironolactone  25 mg Oral Daily   Continuous Infusions: . amiodarone 30 mg/hr (03/20/15 0800)  . milrinone 0.375 mcg/kg/min (03/20/15 0947)   PRN Meds:.levalbuterol, sodium chloride, traMADol  On exam Filed Vitals:   03/20/15 0917 03/20/15 0931  BP:    Pulse: 113 114  Temp:    Resp: 18    PHYSICAL EXAM: General: NAD sitting in chair HEENT: normal Neck: supple. JVP 5-6  . Carotids 2+ bilaterally; no bruits. No lymphadenopathy or thryomegaly appreciated. Cor: PMI normal. Irregular rate & rhythm. Tachy No rubs, gallops or murmurs. Lungs: clear Abdomen: soft, nontender, nondistended. No hepatosplenomegaly.  No bruits or masses. Good bowel sounds. Extremities: no cyanosis, clubbing, rash, UNNA boots with 1+ edema Neuro: alert & orientedx3, cranial nerves grossly intact. Moves all 4 extremities w/o difficulty. Affect pleasant.   Results for orders placed or performed during the hospital encounter of 03/14/15 (from the past 24 hour(s))  Glucose, capillary     Status: Abnormal   Collection Time: 03/19/15 11:41 AM  Result Value Ref Range   Glucose-Capillary 275 (H) 65 - 99 mg/dL   Comment 1 Capillary Specimen   Glucose, capillary     Status: Abnormal   Collection Time: 03/19/15  4:53 PM  Result Value Ref Range   Glucose-Capillary 286 (H) 65 - 99 mg/dL   Comment 1 Capillary Specimen   Glucose, capillary     Status: Abnormal   Collection Time: 03/19/15  9:27 PM  Result Value Ref Range   Glucose-Capillary 238 (H) 65 - 99 mg/dL   Comment 1 Capillary Specimen   Comprehensive metabolic panel     Status: Abnormal   Collection Time: 03/20/15  3:31 AM  Result Value Ref Range   Sodium 126 (L) 135 - 145 mmol/L   Potassium 4.6 3.5 - 5.1 mmol/L   Chloride 85 (L) 101 - 111 mmol/L   CO2 31 22 - 32 mmol/L   Glucose, Bld 410 (H) 65 - 99 mg/dL   BUN 34 (H) 6 - 20 mg/dL   Creatinine, Ser 1.61 (H) 0.61 -  1.24 mg/dL   Calcium 8.3 (L) 8.9 - 10.3 mg/dL   Total Protein 6.9 6.5 - 8.1 g/dL   Albumin 3.0 (L) 3.5 - 5.0 g/dL   AST 21 15 - 41 U/L   ALT 12 (L) 17 - 63 U/L   Alkaline Phosphatase 158 (H) 38 - 126 U/L   Total Bilirubin 1.5 (H) 0.3 - 1.2 mg/dL   GFR calc non Af Amer 45 (L) >60 mL/min   GFR calc Af Amer 52 (L) >60 mL/min   Anion gap 10 5 - 15  Carboxyhemoglobin     Status: Abnormal   Collection Time: 03/20/15  3:35 AM  Result Value Ref Range   Total hemoglobin 10.4 (L) 13.5 - 18.0 g/dL   O2 Saturation 54.0 %   Carboxyhemoglobin 2.4 (H) 0.5 - 1.5 %   Methemoglobin 0.9 0.0 - 1.5 %  Glucose, capillary     Status: Abnormal   Collection Time: 03/20/15  7:17 AM  Result Value Ref Range    Glucose-Capillary 240 (H) 65 - 99 mg/dL   Comment 1 Capillary Specimen      Assessment: 1) Cardiogenic shock 2) A/c systolic HF 3) Acute respiratory failure 4) Cardiorenal syndrome with acute renal failure due to #1 5) Chronic AF - failed DC-CV on amio in 9/16 6) Hypokalemia 7) Hyponatremia  Plan/discussion:  CVP checked personally = 4. Volume status continues to  improve with high-dose milrinone and po diuresis. Co-ox marginal .Will continue milrinone today and try to wean tomorrow.May need to consider home inotropes but social situation a challenge.  K ok.  No ACE/ARB due to renal failure. Will add hydralazine/nitrates.   AF certainly complicating matters. Rate still fast despite digoxin and iv amio. Failed DC-CV in 9/16.  Will add low -dose carvedilol to see if this helps without worsening co-ox too much. Will likely need long-term amio for rate control as well.   May need to consider AVN ablation with biV PPM if amio fails.  Continue Xarelto.   Bensimhon, Daniel,MD 9:58 AM

## 2015-03-20 NOTE — Progress Notes (Signed)
Patient yelling at staff, pulled off leads and monitor.  Monitor leads replaced, reinforced to patient that monitor must stay on.  Emotional support given to patient. Patient calmed down within a few minutes.

## 2015-03-20 NOTE — Progress Notes (Signed)
[facility] PROGRESS NOTE  Jacob [name] LOV:[id number] DOB: 1955-07-30 DOA: 03/14/2015 PCP: [name], MD  Length of stay: 6   Assessment/Plan: Active Problems:   Atrial fibrillation with RVR (HCC)   SOB (shortness of breath)   Morbid obesity due to excess calories (HCC)   Acute on chronic systolic (congestive) heart failure (HCC)   Cardiorenal syndrome with renal failure    # Acute on chronic systolic heart failure Transferred to 2H SDU for IV milrinone and monitoring of CVPs and co-ox. On 12/22  last admission 9/16, and patient required CVP monitoring, was found to be in cardiogenic shock and required vasopressors and milrinone Patient was started on Lasix 120 IV BID,   Zaroxolyn, now switched to PO  diuretics, Lasix 80 mg PO BID  Zaroxolyn 12/21-12/25,  creatinine improving after starting milrinone, likely has cardiorenal syndrome, may need to continue milrinone at discharge Restarted Aldactone 12/25 Continue digoxin,  milrinone, Lasix, heart failure team following, started milrinone 12/22,      Hyponatremia-sodium 134> 126, likely in the setting of heart failure, Zaroxolyn, diuresis,  # Long-standing persistent AF, still uncontrolled - Continue xarelto ,CHADSVASC2 of 4 - No recent bleeds despite prior bright red blood --> Cardioversion is an option - However, his last documented sinus rhythm was 10/01/11; - prior attempt at rhythm control with amiodarone this summer was unsuccessful (see hospital notes 11/2014) Xarelto was held in setting of GI bleed and restarted on September 8 Patient also has an ICD and needs interrogation Started amiodarone 12/23, Rate still fast despite digoxin and iv amio. Will give another amio bolus  metoprolol discontinued by cardiology due to cardiogenic shock,   May need to consider AVN ablation with biV PPM if amio fails.  Restarted Xarelto.   3. Diabetes mellitus type II: Hemoglobin A1c 8.8 Restarted glipizide, Lantus,  NovoLog sliding scale   4.COPD, stable, question right basilar infiltrate, discontinue Rocephin and doxycycline, completed 5 days of antibiotics. Repeat chest x-ray shows moderate CHF, no pneumonia, antibiotics discontinued  DVT prophylaxsis Xarelto  Code Status:    Family Communication: Discussed in detail with the patient, all imaging results, lab results explained to the patient   Disposition Plan: Continue stepdown, cardiology making recommendations   Brief narrative: [name] with hx of HTN, HLD, DM2, CAD s/p 2V CABG w/ MAZE, ischemic cardiomyopathy EF 30% s/p Hassell ICD ([employer]), long standing persistent atrial fibrillation, poorly rate-controlled (clinic HRs in December 130s and up), on xarelto, who presents in decompensated heart failure.  Per clinic note on 02/27/15 he had gained 10 pounds and his lasix was increased from 60 BID to 120 BID.  He states he missed his lasix dose this morning, but has had worsening HF over the past week, notably with increasing DOE (can only walk a few feet before SOB), orthopnea, and frequent PND.  He had one sustained episode of chest pressure that lasted hours this morning, but this has resolved and initial troponin is negative. He denies any salty meals, and states other than missing lasix today, he has been adherent to all meds, including Xarelto (adamant he takes daily).   Consultants:  Cardiology  Procedures:  None  Antibiotics: Anti-infectives    Start     Dose/Rate Route Frequency Ordered Stop   03/15/15 1200  cefTRIAXone (ROCEPHIN) 1 g in dextrose 5 % 50 mL IVPB  Status:  Discontinued     1 g 100 mL/hr over 30 Minutes Intravenous Every 24 hours 03/15/15 1056  03/19/15 1030   03/15/15 1200  doxycycline (VIBRAMYCIN) 100 mg in dextrose 5 % 250 mL IVPB  Status:  Discontinued     100 mg 125 mL/hr over 120 Minutes Intravenous Every 12 hours 03/15/15 1056 03/19/15 1030   03/15/15 0900  levofloxacin (LEVAQUIN) IVPB 750 mg  Status:   Discontinued     750 mg 100 mL/hr over 90 Minutes Intravenous Every 24 hours 03/15/15 0831 03/15/15 1056         HPI/Subjective: Feeling better. Denies SOB/Orthopnea. Unna boots applied yesterday  Objective: Filed Vitals:   03/20/15 0714 03/20/15 0800 03/20/15 0917 03/20/15 0931  BP: 134/65     Pulse:   113 114  Temp: 98.1 F (36.7 C)     TempSrc: Oral     Resp: 17 20 18    Height:      Weight:      SpO2: 98%  95%     Intake/Output Summary (Last 24 hours) at 03/20/15 1054 Last data filed at 03/20/15 0900  Gross per 24 hour  Intake 1301.2 ml  Output   3075 ml  Net -1773.8 ml    Exam:  General: Chronically ill appearing. No resp difficulty. Arrived in a wheelchair HEENT: normal Neck: supple. JVP to jaw . Carotids 2+ bilaterally; no bruits. No lymphadenopathy or thryomegaly appreciated. Cor: PMI normal. Irregular rate & rhythm. No rubs, gallops or murmurs. Lungs: clear Abdomen: soft, nontender, nondistended. No hepatosplenomegaly. No bruits or masses. Good bowel sounds. Extremities: no cyanosis, clubbing, rash, R and LLE 1-2+ edema Neuro: alert & orientedx3, cranial nerves grossly intact. Moves all 4 extremities w/o difficulty. Affect pleasant.  Data Review   Micro Results Recent Results (from the past 240 hour(s))  MRSA PCR Screening     Status: None   Collection Time: 03/16/15  3:45 PM  Result Value Ref Range Status   MRSA by PCR NEGATIVE NEGATIVE Final    Comment:        The GeneXpert MRSA Assay (FDA approved for NASAL specimens only), is one component of a comprehensive MRSA colonization surveillance program. It is not intended to diagnose MRSA infection nor to guide or monitor treatment for MRSA infections.     Radiology Reports Dg Chest 2 View  03/19/2015  CLINICAL DATA:  Shortness of breath and weakness EXAM: CHEST  2 VIEW COMPARISON:  03/16/2015 FINDINGS: There is a left chest wall ICD with lead in the right ventricle. The right arm PICC line  tip is in the cavoatrial junction. Previous median sternotomy and CABG procedure. Bilateral pleural effusions are noted right greater in left. There is mild diffuse pulmonary edema identified. IMPRESSION: 1. Moderate CHF.  Unchanged from previous exam. Electronically Signed   By: Signa Kell M.D.   On: 03/19/2015 09:41   Dg Chest 2 View  03/14/2015  CLINICAL DATA:  Chest pain for 1 day EXAM: CHEST - 2 VIEW COMPARISON:  10/19/6 pain FINDINGS: Cardiac shadow is prominent. Postsurgical changes are again seen. A defibrillator is again noted. New right basilar infiltrate with associated effusion is seen. Mild vascular congestion is noted as well. IMPRESSION: Right basilar infiltrate with associated effusion as well as mild vascular congestion. Electronically Signed   By: Alcide Clever M.D.   On: 03/14/2015 20:34   Dg Chest Port 1 View  03/16/2015  CLINICAL DATA:  Central line placement EXAM: PORTABLE CHEST 1 VIEW COMPARISON:  03/14/2015 FINDINGS: Cardiomediastinal silhouette is stable. Status post median sternotomy. Single lead cardiac pacemaker is unchanged in position. Persistent  small right pleural effusion with right basilar atelectasis or infiltrate. Central mild vascular congestion. There is a right arm/right subclavian central line with tip in the lower SVC. Please note the tip of the central line is not clearly identified due to patient's large body habitus and overlying chest wall artifacts. There is no pneumothorax. IMPRESSION: Persistent small right pleural effusion with right basilar atelectasis or infiltrate. Central mild vascular congestion. There is a right arm/right subclavian central line with tip in the lower SVC. Please note the tip of the central line is not clearly identified due to patient's large body habitus and overlying chest wall artifacts. There is no pneumothorax. Electronically Signed   By: Natasha Mead M.D.   On: 03/16/2015 15:52     CBC  Recent Labs Lab 03/14/15 1920  03/15/15 0440 03/16/15 0340 03/17/15 0431 03/19/15 0425  WBC 8.0 8.8 9.2 9.1 9.7  HGB 9.7* 9.8* 10.5* 10.0* 10.4*  HCT 37.4* 38.0* 39.0 35.8* 37.0*  PLT 287 255 308 285 283  MCV 70.7* 71.0* 69.9* 68.5* 68.1*  MCH 18.3* 18.3* 18.8* 19.1* 19.2*  MCHC 25.9* 25.8* 26.9* 27.9* 28.1*  RDW 23.3* 23.1* 23.6* 23.6* 24.6*    Chemistries   Recent Labs Lab 03/16/15 0025 03/16/15 0340 03/17/15 0431 03/18/15 0430 03/19/15 0425 03/20/15 0331  NA  --  130* 130* 128* 126* 126*  K  --  5.4* 4.1 3.4* 4.3 4.6  CL  --  91* 90* 85* 82* 85*  CO2  --  28 31 32 32 31  GLUCOSE  --  170* 246* 276* 259* 410*  BUN  --  33* 39* 38* 38* 34*  CREATININE  --  1.68* 1.85* 1.68* 1.81* 1.64*  CALCIUM  --  8.6* 8.3* 8.3* 8.4* 8.3*  MG 1.8  --   --  1.5* 2.1  --   AST  --  23 22 16   --  21  ALT  --  15* 13* 14*  --  12*  ALKPHOS  --  152* 155* 130*  --  158*  BILITOT  --  2.0* 1.7* 1.5*  --  1.5*   ------------------------------------------------------------------------------------------------------------------ estimated creatinine clearance is 51.9 mL/min (by C-G formula based on Cr of 1.64). ------------------------------------------------------------------------------------------------------------------ No results for input(s): HGBA1C in the last 72 hours. ------------------------------------------------------------------------------------------------------------------ No results for input(s): CHOL, HDL, LDLCALC, TRIG, CHOLHDL, LDLDIRECT in the last 72 hours. ------------------------------------------------------------------------------------------------------------------  Recent Labs  03/18/15 0430  TSH 4.690*   ------------------------------------------------------------------------------------------------------------------ No results for input(s): VITAMINB12, FOLATE, FERRITIN, TIBC, IRON, RETICCTPCT in the last 72 hours.  Coagulation profile  Recent Labs Lab 03/14/15 1920  INR 1.59*     No results for input(s): DDIMER in the last 72 hours.  Cardiac Enzymes No results for input(s): CKMB, TROPONINI, MYOGLOBIN in the last 168 hours.  Invalid input(s): CK ------------------------------------------------------------------------------------------------------------------ Invalid input(s): POCBNP   CBG:  Recent Labs Lab 03/19/15 0707 03/19/15 1141 03/19/15 1653 03/19/15 2127 03/20/15 0717  GLUCAP 235* 275* 286* 238* 240*       Studies: Dg Chest 2 View  03/19/2015  CLINICAL DATA:  Shortness of breath and weakness EXAM: CHEST  2 VIEW COMPARISON:  03/16/2015 FINDINGS: There is a left chest wall ICD with lead in the right ventricle. The right arm PICC line tip is in the cavoatrial junction. Previous median sternotomy and CABG procedure. Bilateral pleural effusions are noted right greater in left. There is mild diffuse pulmonary edema identified. IMPRESSION: 1. Moderate CHF.  Unchanged from previous exam. Electronically Signed  By: Signa Kell M.D.   On: 03/19/2015 09:41      Lab Results  Component Value Date   HGBA1C 8.8* 03/15/2015   HGBA1C 6.9 12/22/2014   HGBA1C 8.1* 11/12/2014   Lab Results  Component Value Date   LDLCALC 52 08/05/2014   CREATININE 1.64* 03/20/2015       Scheduled Meds: . atorvastatin  40 mg Oral q1800  . digoxin  0.0625 mg Oral Q48H  . docusate sodium  100 mg Oral Daily  . feeding supplement (ENSURE ENLIVE)  237 mL Oral BID BM  . ferrous sulfate  325 mg Oral TID WC  . furosemide  80 mg Oral BID  . glipiZIDE  10 mg Oral BID AC  . insulin aspart  0-15 Units Subcutaneous TID WC  . insulin aspart  0-5 Units Subcutaneous QHS  . insulin glargine  10 Units Subcutaneous Once  . mometasone-formoterol  2 puff Inhalation BID  . pantoprazole  40 mg Oral BID  . rivaroxaban  20 mg Oral Daily  . sodium chloride  10-40 mL Intracatheter Q12H  . spironolactone  25 mg Oral Daily   Continuous Infusions: . amiodarone 30 mg/hr (03/20/15  0800)  . milrinone 0.375 mcg/kg/min (03/20/15 0947)    Active Problems:   Atrial fibrillation with RVR (HCC)   SOB (shortness of breath)   Morbid obesity due to excess calories (HCC)   Acute on chronic systolic (congestive) heart failure (HCC)   Cardiorenal syndrome with renal failure    Time spent: 45 minutes   [facility]  [facility] Pager [phone]. If 7PM-7AM, please contact night-coverage at [email or web address], password [facility] 03/20/2015, 10:54 AM  LOS: 6 days

## 2015-03-20 NOTE — Progress Notes (Signed)
ADVANCED HF TEAM PROGRESS NOTE  He had  prolonged hospitalization in 9/16 for cardiogenic shock. AF with RVR and GI bleed. During that admission required dual inotropes (milrinone/levophed) but was able to be weaned. Failed DC-CV of AF and tolerated rate control.   He has iCM with EF 25-30%. Readmitted with recurrent Class IV HF symptoms with marked volume overload (weight up nearly 30 pounds from previous d/c) and AF with RVR 115-120. Has had little response to IV lasix. Renal function contiinues to deteriorate.   Remains on milrinone 0.375. Switched to po diuretics yesterday. Weight down another 3 pounds. Creatinine slightly improved. Co-ox 60%. UNNA boots removed at his request. Now with TED hose.  Denies SOB/Orthopnea. ]  AF still fast despite amio. 110-120.     Scheduled Meds: . atorvastatin  40 mg Oral q1800  . digoxin  0.0625 mg Oral Q48H  . docusate sodium  100 mg Oral Daily  . feeding supplement (ENSURE ENLIVE)  237 mL Oral BID BM  . ferrous sulfate  325 mg Oral TID WC  . furosemide  80 mg Oral BID  . glipiZIDE  5 mg Oral BID AC  . insulin aspart  0-15 Units Subcutaneous TID WC  . insulin aspart  0-5 Units Subcutaneous QHS  . insulin glargine  10 Units Subcutaneous Once  . mometasone-formoterol  2 puff Inhalation BID  . pantoprazole  40 mg Oral BID  . rivaroxaban  20 mg Oral Daily  . sodium chloride  10-40 mL Intracatheter Q12H  . spironolactone  25 mg Oral Daily   Continuous Infusions: . amiodarone 30 mg/hr (03/20/15 0800)  . milrinone 0.375 mcg/kg/min (03/20/15 0947)   PRN Meds:.levalbuterol, sodium chloride, traMADol  On exam Filed Vitals:   03/20/15 0917 03/20/15 0931  BP:    Pulse: 113 114  Temp:    Resp: 18    PHYSICAL EXAM: General: NAD sitting in chair HEENT: normal Neck: supple. JVP 5-6  . Carotids 2+ bilaterally; no bruits. No lymphadenopathy or thryomegaly appreciated. Cor: PMI normal. Irregular rate & rhythm. Tachy No rubs, gallops or  murmurs. Lungs: clear Abdomen: soft, nontender, nondistended. No hepatosplenomegaly. No bruits or masses. Good bowel sounds. Extremities: no cyanosis, clubbing, rash, UNNA boots with 1+ edema Neuro: alert & orientedx3, cranial nerves grossly intact. Moves all 4 extremities w/o difficulty. Affect pleasant.   Results for orders placed or performed during the hospital encounter of 03/14/15 (from the past 24 hour(s))  Glucose, capillary     Status: Abnormal   Collection Time: 03/19/15 11:41 AM  Result Value Ref Range   Glucose-Capillary 275 (H) 65 - 99 mg/dL   Comment 1 Capillary Specimen   Glucose, capillary     Status: Abnormal   Collection Time: 03/19/15  4:53 PM  Result Value Ref Range   Glucose-Capillary 286 (H) 65 - 99 mg/dL   Comment 1 Capillary Specimen   Glucose, capillary     Status: Abnormal   Collection Time: 03/19/15  9:27 PM  Result Value Ref Range   Glucose-Capillary 238 (H) 65 - 99 mg/dL   Comment 1 Capillary Specimen   Comprehensive metabolic panel     Status: Abnormal   Collection Time: 03/20/15  3:31 AM  Result Value Ref Range   Sodium 126 (L) 135 - 145 mmol/L   Potassium 4.6 3.5 - 5.1 mmol/L   Chloride 85 (L) 101 - 111 mmol/L   CO2 31 22 - 32 mmol/L   Glucose, Bld 410 (H) 65 - 99  mg/dL   BUN 34 (H) 6 - 20 mg/dL   Creatinine, Ser 1.61 (H) 0.61 - 1.24 mg/dL   Calcium 8.3 (L) 8.9 - 10.3 mg/dL   Total Protein 6.9 6.5 - 8.1 g/dL   Albumin 3.0 (L) 3.5 - 5.0 g/dL   AST 21 15 - 41 U/L   ALT 12 (L) 17 - 63 U/L   Alkaline Phosphatase 158 (H) 38 - 126 U/L   Total Bilirubin 1.5 (H) 0.3 - 1.2 mg/dL   GFR calc non Af Amer 45 (L) >60 mL/min   GFR calc Af Amer 52 (L) >60 mL/min   Anion gap 10 5 - 15  Carboxyhemoglobin     Status: Abnormal   Collection Time: 03/20/15  3:35 AM  Result Value Ref Range   Total hemoglobin 10.4 (L) 13.5 - 18.0 g/dL   O2 Saturation 09.6 %   Carboxyhemoglobin 2.4 (H) 0.5 - 1.5 %   Methemoglobin 0.9 0.0 - 1.5 %  Glucose, capillary     Status:  Abnormal   Collection Time: 03/20/15  7:17 AM  Result Value Ref Range   Glucose-Capillary 240 (H) 65 - 99 mg/dL   Comment 1 Capillary Specimen      Assessment: 1) Cardiogenic shock 2) A/c systolic HF 3) Acute respiratory failure 4) Cardiorenal syndrome with acute renal failure due to #1 5) Chronic AF - failed DC-CV on amio in 9/16 6) Hypokalemia 7) Hyponatremia  Plan/discussion:  CVP checked personally = 5. Volume status continues to  improve with high-dose milrinone and po diuresis. Co-ox marginal .Will continue milrinone today and try to wean tomorrow. May need to consider home inotropes but social situation a challenge.  K ok.  No ACE/ARB due to renal failure. Will add hydralazine/nitrates.   AF certainly complicating matters. Rate still fast despite digoxin and iv amio. Failed DC-CV in 9/16.  Will add low -dose carvedilol to see if this helps without worsening co-ox too much. Will switch amio to po.  May need to consider AVN ablation with biV PPM if amio fails.  Continue Xarelto.   Rochell Mabie,MD 11:39 AM

## 2015-03-20 NOTE — Discharge Instructions (Signed)
Atrial Fibrillation °Atrial fibrillation is a type of irregular or rapid heartbeat (arrhythmia). In atrial fibrillation, the heart quivers continuously in a chaotic pattern. This occurs when parts of the heart receive disorganized signals that make the heart unable to pump blood normally. This can increase the risk for stroke, heart failure, and other heart-related conditions. There are different types of atrial fibrillation, including: °· Paroxysmal atrial fibrillation. This type starts suddenly, and it usually stops on its own shortly after it starts. °· Persistent atrial fibrillation. This type often lasts longer than a week. It may stop on its own or with treatment. °· Long-lasting persistent atrial fibrillation. This type lasts longer than 12 months. °· Permanent atrial fibrillation. This type does not go away. °Talk with your health care provider to learn about the type of atrial fibrillation that you have. °CAUSES °This condition is caused by some heart-related conditions or procedures, including: °· A heart attack. °· Coronary artery disease. °· Heart failure. °· Heart valve conditions. °· High blood pressure. °· Inflammation of the sac that surrounds the heart (pericarditis). °· Heart surgery. °· Certain heart rhythm disorders, such as Wolf-Parkinson-White syndrome. °Other causes include: °· Pneumonia. °· Obstructive sleep apnea. °· Blockage of an artery in the lungs (pulmonary embolism, or PE). °· Lung cancer. °· Chronic lung disease. °· Thyroid problems, especially if the thyroid is overactive (hyperthyroidism). °· Caffeine. °· Excessive alcohol use or illegal drug use. °· Use of some medicines, including certain decongestants and diet pills. °Sometimes, the cause cannot be found. °RISK FACTORS °This condition is more likely to develop in: °· People who are older in age. °· People who smoke. °· People who have diabetes mellitus. °· People who are overweight (obese). °· Athletes who exercise  vigorously. °SYMPTOMS °Symptoms of this condition include: °· A feeling that your heart is beating rapidly or irregularly. °· A feeling of discomfort or pain in your chest. °· Shortness of breath. °· Sudden light-headedness or weakness. °· Getting tired easily during exercise. °In some cases, there are no symptoms. °DIAGNOSIS °Your health care provider may be able to detect atrial fibrillation when taking your pulse. If detected, this condition may be diagnosed with: °· An electrocardiogram (ECG). °· A Holter monitor test that records your heartbeat patterns over a 24-hour period. °· Transthoracic echocardiogram (TTE) to evaluate how blood flows through your heart. °· Transesophageal echocardiogram (TEE) to view more detailed images of your heart. °· A stress test. °· Imaging tests, such as a CT scan or chest X-ray. °· Blood tests. °TREATMENT °The main goals of treatment are to prevent blood clots from forming and to keep your heart beating at a normal rate and rhythm. The type of treatment that you receive depends on many factors, such as your underlying medical conditions and how you feel when you are experiencing atrial fibrillation. °This condition may be treated with: °· Medicine to slow down the heart rate, bring the heart's rhythm back to normal, or prevent clots from forming. °· Electrical cardioversion. This is a procedure that resets your heart's rhythm by delivering a controlled, low-energy shock to the heart through your skin. °· Different types of ablation, such as catheter ablation, catheter ablation with pacemaker, or surgical ablation. These procedures destroy the heart tissues that send abnormal signals. When the pacemaker is used, it is placed under your skin to help your heart beat in a regular rhythm. °HOME CARE INSTRUCTIONS °· Take over-the counter and prescription medicines only as told by your health care provider. °·   If your health care provider prescribed a blood-thinning medicine  (anticoagulant), take it exactly as told. Taking too much blood-thinning medicine can cause bleeding. If you do not take enough blood-thinning medicine, you will not have the protection that you need against stroke and other problems. °· Do not use tobacco products, including cigarettes, chewing tobacco, and e-cigarettes. If you need help quitting, ask your health care provider. °· If you have obstructive sleep apnea, manage your condition as told by your health care provider. °· Do not drink alcohol. °· Do not drink beverages that contain caffeine, such as coffee, soda, and tea. °· Maintain a healthy weight. Do not use diet pills unless your health care provider approves. Diet pills may make heart problems worse. °· Follow diet instructions as told by your health care provider. °· Exercise regularly as told by your health care provider. °· Keep all follow-up visits as told by your health care provider. This is important. °PREVENTION °· Avoid drinking beverages that contain caffeine or alcohol. °· Avoid certain medicines, especially medicines that are used for breathing problems. °· Avoid certain herbs and herbal medicines, such as those that contain ephedra or ginseng. °· Do not use illegal drugs, such as cocaine and amphetamines. °· Do not smoke. °· Manage your high blood pressure. °SEEK MEDICAL CARE IF: °· You notice a change in the rate, rhythm, or strength of your heartbeat. °· You are taking an anticoagulant and you notice increased bruising. °· You tire more easily when you exercise or exert yourself. °SEEK IMMEDIATE MEDICAL CARE IF: °· You have chest pain, abdominal pain, sweating, or weakness. °· You feel nauseous. °· You notice blood in your vomit, bowel movement, or urine. °· You have shortness of breath. °· You suddenly have swollen feet and ankles. °· You feel dizzy. °· You have sudden weakness or numbness of the face, arm, or leg, especially on one side of the body. °· You have trouble speaking,  trouble understanding, or both (aphasia). °· Your face or your eyelid droops on one side. °These symptoms may represent a serious problem that is an emergency. Do not wait to see if the symptoms will go away. Get medical help right away. Call your local emergency services (911 in the [place] Do not drive yourself to the hospital. °  °This information is not intended to replace advice given to you by your health care provider. Make sure you discuss any questions you have with your health care provider. °  °Document Released: 03/11/2005 Document Revised: 11/30/2014 Document Reviewed: 07/06/2014 °Elsevier Interactive Patient Education ©2016 [employer] °------------------------------------------------------------------------------------------------------------------------------------------------------------------------------ °Information on my medicine - XARELTO® (Rivaroxaban) ° °This medication education was reviewed with me or my healthcare representative as part of my discharge preparation.  The pharmacist that spoke with me during my hospital stay was:  Opaline Reyburn N Jashawna Reever, RPH ° °Why was Xarelto® prescribed for you? °Xarelto® was prescribed for you to reduce the risk of a blood clot forming that can cause a stroke if you have a medical condition called atrial fibrillation (a type of irregular heartbeat). ° °What do you need to know about xarelto® ? °Take your Xarelto® ONCE DAILY at the same time every day with your evening meal. °If you have difficulty swallowing the tablet whole, you may crush it and mix in applesauce just prior to taking your dose. ° °Take Xarelto® exactly as prescribed by your doctor and DO NOT stop taking Xarelto® without talking to the doctor who prescribed the medication.  Stopping without other   other stroke prevention medication to take the place of Xarelto may increase your risk of developing a clot that causes a stroke.  Refill your prescription before you run out.  After discharge, you should have regular check-up appointments with your healthcare provider that is prescribing your  Xarelto.  In the future your dose may need to be changed if your kidney function or weight changes by a significant amount.  What do you do if you miss a dose? If you are taking Xarelto ONCE DAILY and you miss a dose, take it as soon as you remember on the same day then continue your regularly scheduled once daily regimen the next day. Do not take two doses of Xarelto at the same time or on the same day.   Important Safety Information A possible side effect of Xarelto is bleeding. You should call your healthcare provider right away if you experience any of the following: ? Bleeding from an injury or your nose that does not stop. ? Unusual colored urine (red or dark brown) or unusual colored stools (red or black). ? Unusual bruising for unknown reasons. ? A serious fall or if you hit your head (even if there is no bleeding).  Some medicines may interact with Xarelto and might increase your risk of bleeding while on Xarelto. To help avoid this, consult your healthcare provider or pharmacist prior to using any new prescription or non-prescription medications, including herbals, vitamins, non-steroidal anti-inflammatory drugs (NSAIDs) and supplements.  This website has more information on Xarelto: [email or web address].br. ____________________________________________________________________________________________

## 2015-03-20 NOTE — Progress Notes (Addendum)
Inpatient Diabetes Program Recommendations  AACE/ADA: New Consensus Statement on Inpatient Glycemic Control (2015)  Target Ranges:  Prepandial:   less than 140 mg/dL      Peak postprandial:   less than 180 mg/dL (1-2 hours)      Critically ill patients:  140 - 180 mg/dL  Results for NYGIL, LUTTRULL (MRN [id number]) as of 03/20/2015 10:39  Ref. Range 03/19/2015 07:07 03/19/2015 11:41 03/19/2015 16:53 03/19/2015 21:27 03/20/2015 07:17  Glucose-Capillary Latest Ref Range: 65-99 mg/dL 253 (H) 664 (H) 403 (H) 238 (H) 240 (H)   Review of Glycemic Control  Diabetes history: DM2 Outpatient Diabetes medications: Glipizide 10 mg BID Current orders for Inpatient glycemic control: Novolog 0-15 units TID with meals, Novolog 0-5 units HS  Inpatient Diabetes Program Recommendations: Insulin - Basal: Glucose ranged from 235-286 mg/dl on 47/42/59 and fasting glucose is 240 mg/dl this morning. Please consider ordering low dose basal insulin; recommend starting with Lantus 10 units daily (starting now). Insulin - Meal Coverage: Please consider ordering Novolog 3 units TID with meals for meal coverage (in addition to Novolog correction scale).  Thanks, Orlando Penner, RN, MSN, CDE Diabetes Coordinator Inpatient Diabetes Program [phone] (Team Pager from 8am to 5pm) [phone] ([place] office) [phone] [facility] office) [phone] [facility] office)

## 2015-03-21 ENCOUNTER — Encounter: Payer: Self-pay | Admitting: *Deleted

## 2015-03-21 LAB — GLUCOSE, CAPILLARY
GLUCOSE-CAPILLARY: 164 mg/dL — AB (ref 65–99)
GLUCOSE-CAPILLARY: 206 mg/dL — AB (ref 65–99)
GLUCOSE-CAPILLARY: 261 mg/dL — AB (ref 65–99)
Glucose-Capillary: 238 mg/dL — ABNORMAL HIGH (ref 65–99)

## 2015-03-21 LAB — COMPREHENSIVE METABOLIC PANEL
ALBUMIN: 3.1 g/dL — AB (ref 3.5–5.0)
ALT: 13 U/L — ABNORMAL LOW (ref 17–63)
ANION GAP: 10 (ref 5–15)
AST: 23 U/L (ref 15–41)
Alkaline Phosphatase: 157 U/L — ABNORMAL HIGH (ref 38–126)
BILIRUBIN TOTAL: 1.4 mg/dL — AB (ref 0.3–1.2)
BUN: 39 mg/dL — AB (ref 6–20)
CHLORIDE: 86 mmol/L — AB (ref 101–111)
CO2: 32 mmol/L (ref 22–32)
Calcium: 8.7 mg/dL — ABNORMAL LOW (ref 8.9–10.3)
Creatinine, Ser: 1.49 mg/dL — ABNORMAL HIGH (ref 0.61–1.24)
GFR calc Af Amer: 58 mL/min — ABNORMAL LOW (ref >60)
GFR, EST NON AFRICAN AMERICAN: 50 mL/min — AB (ref >60)
GLUCOSE: 243 mg/dL — AB (ref 65–99)
POTASSIUM: 4.2 mmol/L (ref 3.5–5.1)
Sodium: 128 mmol/L — ABNORMAL LOW (ref 135–145)
TOTAL PROTEIN: 7.2 g/dL (ref 6.5–8.1)

## 2015-03-21 LAB — CBC
HEMATOCRIT: 39.1 % (ref 39.0–52.0)
Hemoglobin: 11 g/dL — ABNORMAL LOW (ref 13.0–17.0)
MCH: 19.4 pg — ABNORMAL LOW (ref 26.0–34.0)
MCHC: 28.1 g/dL — AB (ref 30.0–36.0)
MCV: 69.1 fL — ABNORMAL LOW (ref 78.0–100.0)
PLATELETS: 291 10*3/uL (ref 150–400)
RBC: 5.66 MIL/uL (ref 4.22–5.81)
RDW: 25.5 % — AB (ref 11.5–15.5)
WBC: 8.1 10*3/uL (ref 4.0–10.5)

## 2015-03-21 LAB — CARBOXYHEMOGLOBIN
CARBOXYHEMOGLOBIN: 2.8 % — AB (ref 0.5–1.5)
Carboxyhemoglobin: 2.7 % — ABNORMAL HIGH (ref 0.5–1.5)
METHEMOGLOBIN: 0.6 % (ref 0.0–1.5)
Methemoglobin: 0.6 % (ref 0.0–1.5)
O2 SAT: 68.7 %
O2 Saturation: 64.6 %
Total hemoglobin: 11.1 g/dL — ABNORMAL LOW (ref 13.5–18.0)
Total hemoglobin: 11.2 g/dL — ABNORMAL LOW (ref 13.5–18.0)

## 2015-03-21 LAB — MAGNESIUM: MAGNESIUM: 1.7 mg/dL (ref 1.7–2.4)

## 2015-03-21 MED ORDER — MAGNESIUM SULFATE IN D5W 10-5 MG/ML-% IV SOLN
1.0000 g | Freq: Once | INTRAVENOUS | Status: AC
  Administered 2015-03-21: 1 g via INTRAVENOUS
  Filled 2015-03-21: qty 100

## 2015-03-21 MED ORDER — MILRINONE IN DEXTROSE 20 MG/100ML IV SOLN
0.1250 ug/kg/min | INTRAVENOUS | Status: DC
  Administered 2015-03-21 (×2): 0.25 ug/kg/min via INTRAVENOUS
  Administered 2015-03-22: 0.125 ug/kg/min via INTRAVENOUS
  Filled 2015-03-21 (×3): qty 100

## 2015-03-21 NOTE — Progress Notes (Signed)
ADVANCED HF TEAM PROGRESS NOTE  He had  prolonged hospitalization in 9/16 for cardiogenic shock. AF with RVR and GI bleed. During that admission required dual inotropes (milrinone/levophed) but was able to be weaned. Failed DC-CV of AF and tolerated rate control.   He has iCM and Echo 11/24/14 showed EF 30-35%. Readmitted with recurrent Class IV HF symptoms with marked volume overload (weight up nearly 30 pounds from previous d/c) and AF with RVR 115-120.   Remains on milrinone 0.375. Switched to po diuretics 03/19/15. Weight down another 5 pounds. Creatinine continues to improve. Co-ox 68.7%. Denies SOB or CP.  Anxious to go home but accepts he may require 1-2 days further of treatment.   AF rate remains in 110-120 despite amio.    Scheduled Meds: . amiodarone  200 mg Oral BID  . atorvastatin  40 mg Oral q1800  . carvedilol  3.125 mg Oral BID WC  . digoxin  0.0625 mg Oral Q48H  . docusate sodium  100 mg Oral Daily  . feeding supplement (ENSURE ENLIVE)  237 mL Oral BID BM  . ferrous sulfate  325 mg Oral TID WC  . furosemide  80 mg Oral BID  . glipiZIDE  5 mg Oral BID AC  . insulin aspart  0-15 Units Subcutaneous TID WC  . insulin aspart  0-5 Units Subcutaneous QHS  . isosorbide-hydrALAZINE  0.5 tablet Oral TID  . mometasone-formoterol  2 puff Inhalation BID  . pantoprazole  40 mg Oral BID  . rivaroxaban  20 mg Oral Daily  . sodium chloride  10-40 mL Intracatheter Q12H  . spironolactone  25 mg Oral Daily   Continuous Infusions: . milrinone 0.375 mcg/kg/min (03/21/15 0055)   PRN Meds:.levalbuterol, sodium chloride, traMADol  On exam Filed Vitals:   03/21/15 0400 03/21/15 0418  BP:  109/63  Pulse:    Temp:  97.7 F (36.5 C)  Resp: 27 21   PHYSICAL EXAM: General: NAD  HEENT: normal Neck: supple. JVP 5-6  . Carotids 2+ bilaterally; no bruits. No thyromegaly or nodule noted.  Cor: PMI normal. Irregular rate & rhythm. Tachy No M/G/R. Lungs: CTAB, normal  effort. Abdomen: soft, NT, ND, no HSM. No bruits or masses. +BS  Extremities: no cyanosis, clubbing, rash, TED hose in place.  with 1+ edema Neuro: alert & orientedx3, cranial nerves grossly intact. Moves all 4 extremities w/o difficulty. Affect pleasant.   Results for orders placed or performed during the hospital encounter of 03/14/15 (from the past 24 hour(s))  Glucose, capillary     Status: Abnormal   Collection Time: 03/20/15 11:50 AM  Result Value Ref Range   Glucose-Capillary 247 (H) 65 - 99 mg/dL   Comment 1 Capillary Specimen   Glucose, capillary     Status: Abnormal   Collection Time: 03/20/15  4:20 PM  Result Value Ref Range   Glucose-Capillary 133 (H) 65 - 99 mg/dL   Comment 1 Capillary Specimen   Glucose, capillary     Status: Abnormal   Collection Time: 03/20/15  9:20 PM  Result Value Ref Range   Glucose-Capillary 260 (H) 65 - 99 mg/dL   Comment 1 Capillary Specimen   Carboxyhemoglobin     Status: Abnormal   Collection Time: 03/21/15  4:50 AM  Result Value Ref Range   Total hemoglobin 11.1 (L) 13.5 - 18.0 g/dL   O2 Saturation 72.5 %   Carboxyhemoglobin 2.8 (H) 0.5 - 1.5 %   Methemoglobin 0.6 0.0 - 1.5 %  CBC  Status: Abnormal   Collection Time: 03/21/15  4:54 AM  Result Value Ref Range   WBC 8.1 4.0 - 10.5 K/uL   RBC 5.66 4.22 - 5.81 MIL/uL   Hemoglobin 11.0 (L) 13.0 - 17.0 g/dL   HCT 56.3 89.3 - 73.4 %   MCV 69.1 (L) 78.0 - 100.0 fL   MCH 19.4 (L) 26.0 - 34.0 pg   MCHC 28.1 (L) 30.0 - 36.0 g/dL   RDW 28.7 (H) 68.1 - 15.7 %   Platelets 291 150 - 400 K/uL  Comprehensive metabolic panel     Status: Abnormal   Collection Time: 03/21/15  4:54 AM  Result Value Ref Range   Sodium 128 (L) 135 - 145 mmol/L   Potassium 4.2 3.5 - 5.1 mmol/L   Chloride 86 (L) 101 - 111 mmol/L   CO2 32 22 - 32 mmol/L   Glucose, Bld 243 (H) 65 - 99 mg/dL   BUN 39 (H) 6 - 20 mg/dL   Creatinine, Ser 2.62 (H) 0.61 - 1.24 mg/dL   Calcium 8.7 (L) 8.9 - 10.3 mg/dL   Total Protein 7.2  6.5 - 8.1 g/dL   Albumin 3.1 (L) 3.5 - 5.0 g/dL   AST 23 15 - 41 U/L   ALT 13 (L) 17 - 63 U/L   Alkaline Phosphatase 157 (H) 38 - 126 U/L   Total Bilirubin 1.4 (H) 0.3 - 1.2 mg/dL   GFR calc non Af Amer 50 (L) >60 mL/min   GFR calc Af Amer 58 (L) >60 mL/min   Anion gap 10 5 - 15  Magnesium     Status: None   Collection Time: 03/21/15  4:54 AM  Result Value Ref Range   Magnesium 1.7 1.7 - 2.4 mg/dL     Assessment: 1) Cardiogenic shock 2) A/c systolic HF 3) Acute respiratory failure 4) Cardiorenal syndrome with acute renal failure due to #1 5) Chronic AF - failed DC-CV on amio in 9/16 6) Hypokalemia 7) Hyponatremia  Plan/discussion:  CVP 4 (checked personally) Volume status status stable on po diuresis. Co-ox stable this am. Will wean to 0.25 and recheck coox later this am. May need to consider home inotropes but social situation a challenge. Wants to go home, but will likely need 1-2 more days of medication adjustment.   K stable.  No ACE/ARB with renal failure. Continue hydralazine/nitrates. (added 03/20/15)  AF rate still fast despite digoxin and iv amio. Failed DC-CV in 9/16.   Now on po amio and Coreg as of 03/20/15.  Continue Xarelto.  May need to consider AVN ablation with biV PPM if amio fails.    Graciella Freer, PA-C 7:37 AM   Advanced Heart Failure Team Pager [phone] (M-F; 7a - 4p)  Please contact [facility] for night-coverage after hours (4p -7a ) and weekends on [email or web address]    Patient seen and examined with Otilio Saber, PA-C. We discussed all aspects of the encounter. I agree with the assessment and plan as stated above.   Volume status looks good. Continues to diurese on po diuretics and milrinone. Renal function stable. Wean milrinone slowly. HR still fast despite amio, dig and low dose carvedilol. May need EP to see regarding possible AVN ablation/CRT-D. Will see what HR does will decreased milrinone.   Tess Potts,MD 8:01  AM

## 2015-03-21 NOTE — Progress Notes (Signed)
[facility] PROGRESS NOTE  Jacob [name] ZOX:[id number] DOB: January 03, 1956 DOA: 03/14/2015 PCP: [name], MD  Length of stay: 7   Assessment/Plan: Active Problems:   Atrial fibrillation with RVR (HCC)   SOB (shortness of breath)   Morbid obesity due to excess calories (HCC)   Acute on chronic systolic (congestive) heart failure (HCC)   Cardiorenal syndrome with renal failure    # Acute on chronic systolic heart failure Transferred to 2H SDU for IV milrinone and monitoring of CVPs and co-ox. On 12/22  last admission 9/16, and patient required CVP monitoring, was found to be in cardiogenic shock and required vasopressors and milrinone Patient was started on Lasix 120 IV BID,   Zaroxolyn, now switched to PO  diuretics, Lasix 80 mg PO BID  Zaroxolyn 12/21-12/25,  creatinine improving after starting milrinone, likely has cardiorenal syndrome, may need to continue milrinone at discharge Restarted Aldactone 12/25 Continue digoxin,  milrinone, Lasix, heart failure team following, started milrinone 12/22, now weaning milrinone Volume status continues to improve with high-dose milrinone and po diuresis     Hyponatremia-sodium 134> 126>128, likely in the setting of heart failure, Zaroxolyn, diuresis,  # Long-standing persistent AF, still uncontrolled - Continue xarelto ,CHADSVASC2 of 4 - No recent bleeds despite prior bright red blood --> Cardioversion is an option - However, his last documented sinus rhythm was 10/01/11; - prior attempt at rhythm control with amiodarone this summer was unsuccessful (see hospital notes 11/2014) Xarelto was held in setting of GI bleed and restarted on September 8 Patient also has an ICD   Started amiodarone 12/23, Rate still fast despite digoxin and iv amio. Cardiology managing amiodarone  metoprolol discontinued by cardiology due to cardiogenic shock,   May need to consider AVN ablation with biV PPM if amio fails.  Restarted  Xarelto.   3. Diabetes mellitus type II: Hemoglobin A1c 8.8 Restarted glipizide, Lantus, NovoLog sliding scale   4.COPD, stable, question right basilar infiltrate, discontinue Rocephin and doxycycline, completed 5 days of antibiotics. Repeat chest x-ray shows moderate CHF, no pneumonia, antibiotics discontinued  DVT prophylaxsis Xarelto  Code Status:    Family Communication: Discussed in detail with the patient, all imaging results, lab results explained to the patient   Disposition Plan: Continue stepdown, disposition will depend upon cardiology, may transfer to heart failure   Brief narrative: [name] with hx of HTN, HLD, DM2, CAD s/p 2V CABG w/ MAZE, ischemic cardiomyopathy EF 30% s/p Leake ICD ([employer]), long standing persistent atrial fibrillation, poorly rate-controlled (clinic HRs in December 130s and up), on xarelto, who presents in decompensated heart failure.  Per clinic note on 02/27/15 he had gained 10 pounds and his lasix was increased from 60 BID to 120 BID.  He states he missed his lasix dose this morning, but has had worsening HF over the past week, notably with increasing DOE (can only walk a few feet before SOB), orthopnea, and frequent PND.  He had one sustained episode of chest pressure that lasted hours this morning, but this has resolved and initial troponin is negative. He denies any salty meals, and states other than missing lasix today, he has been adherent to all meds, including Xarelto (adamant he takes daily).   Consultants:  Cardiology  Procedures:  None  Antibiotics: Anti-infectives    Start     Dose/Rate Route Frequency Ordered Stop   03/15/15 1200  cefTRIAXone (ROCEPHIN) 1 g in dextrose 5 % 50 mL IVPB  Status:  Discontinued  1 g 100 mL/hr over 30 Minutes Intravenous Every 24 hours 03/15/15 1056 03/19/15 1030   03/15/15 1200  doxycycline (VIBRAMYCIN) 100 mg in dextrose 5 % 250 mL IVPB  Status:  Discontinued     100 mg 125 mL/hr over  120 Minutes Intravenous Every 12 hours 03/15/15 1056 03/19/15 1030   03/15/15 0900  levofloxacin (LEVAQUIN) IVPB 750 mg  Status:  Discontinued     750 mg 100 mL/hr over 90 Minutes Intravenous Every 24 hours 03/15/15 0831 03/15/15 1056         HPI/Subjective: Continues to feel better, no fever , no cp, no shortness of breath  Objective: Filed Vitals:   03/20/15 2300 03/21/15 0000 03/21/15 0400 03/21/15 0418  BP: 115/59   109/63  Pulse:      Temp: 98.1 F (36.7 C)   97.7 F (36.5 C)  TempSrc: Oral   Oral  Resp: 24 19 27 21   Height:      Weight:    92.216 kg (203 lb 4.8 oz)  SpO2: 98%   99%    Intake/Output Summary (Last 24 hours) at 03/21/15 0543 Last data filed at 03/21/15 0400  Gross per 24 hour  Intake 1597.6 ml  Output   3625 ml  Net -2027.4 ml    Exam:  General: Chronically ill appearing. No resp difficulty. Arrived in a wheelchair HEENT: normal Neck: supple. JVP to jaw . Carotids 2+ bilaterally; no bruits. No lymphadenopathy or thryomegaly appreciated. Cor: PMI normal. Irregular rate & rhythm. No rubs, gallops or murmurs. Lungs: clear Abdomen: soft, nontender, nondistended. No hepatosplenomegaly. No bruits or masses. Good bowel sounds. Extremities: no cyanosis, clubbing, rash, R and LLE 1-2+ edema Neuro: alert & orientedx3, cranial nerves grossly intact. Moves all 4 extremities w/o difficulty. Affect pleasant.  Data Review   Micro Results Recent Results (from the past 240 hour(s))  MRSA PCR Screening     Status: None   Collection Time: 03/16/15  3:45 PM  Result Value Ref Range Status   MRSA by PCR NEGATIVE NEGATIVE Final    Comment:        The GeneXpert MRSA Assay (FDA approved for NASAL specimens only), is one component of a comprehensive MRSA colonization surveillance program. It is not intended to diagnose MRSA infection nor to guide or monitor treatment for MRSA infections.     Radiology Reports Dg Chest 2 View  03/19/2015  CLINICAL  DATA:  Shortness of breath and weakness EXAM: CHEST  2 VIEW COMPARISON:  03/16/2015 FINDINGS: There is a left chest wall ICD with lead in the right ventricle. The right arm PICC line tip is in the cavoatrial junction. Previous median sternotomy and CABG procedure. Bilateral pleural effusions are noted right greater in left. There is mild diffuse pulmonary edema identified. IMPRESSION: 1. Moderate CHF.  Unchanged from previous exam. Electronically Signed   By: Signa Kell M.D.   On: 03/19/2015 09:41   Dg Chest 2 View  03/14/2015  CLINICAL DATA:  Chest pain for 1 day EXAM: CHEST - 2 VIEW COMPARISON:  10/19/6 pain FINDINGS: Cardiac shadow is prominent. Postsurgical changes are again seen. A defibrillator is again noted. New right basilar infiltrate with associated effusion is seen. Mild vascular congestion is noted as well. IMPRESSION: Right basilar infiltrate with associated effusion as well as mild vascular congestion. Electronically Signed   By: Alcide Clever M.D.   On: 03/14/2015 20:34   Dg Chest Port 1 View  03/16/2015  CLINICAL DATA:  Central line placement  EXAM: PORTABLE CHEST 1 VIEW COMPARISON:  03/14/2015 FINDINGS: Cardiomediastinal silhouette is stable. Status post median sternotomy. Single lead cardiac pacemaker is unchanged in position. Persistent small right pleural effusion with right basilar atelectasis or infiltrate. Central mild vascular congestion. There is a right arm/right subclavian central line with tip in the lower SVC. Please note the tip of the central line is not clearly identified due to patient's large body habitus and overlying chest wall artifacts. There is no pneumothorax. IMPRESSION: Persistent small right pleural effusion with right basilar atelectasis or infiltrate. Central mild vascular congestion. There is a right arm/right subclavian central line with tip in the lower SVC. Please note the tip of the central line is not clearly identified due to patient's large body habitus  and overlying chest wall artifacts. There is no pneumothorax. Electronically Signed   By: Natasha Mead M.D.   On: 03/16/2015 15:52     CBC  Recent Labs Lab 03/15/15 0440 03/16/15 0340 03/17/15 0431 03/19/15 0425 03/21/15 0454  WBC 8.8 9.2 9.1 9.7 8.1  HGB 9.8* 10.5* 10.0* 10.4* 11.0*  HCT 38.0* 39.0 35.8* 37.0* 39.1  PLT 255 308 285 283 291  MCV 71.0* 69.9* 68.5* 68.1* 69.1*  MCH 18.3* 18.8* 19.1* 19.2* 19.4*  MCHC 25.8* 26.9* 27.9* 28.1* 28.1*  RDW 23.1* 23.6* 23.6* 24.6* 25.5*    Chemistries   Recent Labs Lab 03/16/15 0025 03/16/15 0340 03/17/15 0431 03/18/15 0430 03/19/15 0425 03/20/15 0331 03/21/15 0454  NA  --  130* 130* 128* 126* 126* 128*  K  --  5.4* 4.1 3.4* 4.3 4.6 4.2  CL  --  91* 90* 85* 82* 85* 86*  CO2  --  28 31 32 32 31 32  GLUCOSE  --  170* 246* 276* 259* 410* 243*  BUN  --  33* 39* 38* 38* 34* 39*  CREATININE  --  1.68* 1.85* 1.68* 1.81* 1.64* 1.49*  CALCIUM  --  8.6* 8.3* 8.3* 8.4* 8.3* 8.7*  MG 1.8  --   --  1.5* 2.1  --  1.7  AST  --  23 22 16   --  21 23  ALT  --  15* 13* 14*  --  12* 13*  ALKPHOS  --  152* 155* 130*  --  158* 157*  BILITOT  --  2.0* 1.7* 1.5*  --  1.5* 1.4*   ------------------------------------------------------------------------------------------------------------------ estimated creatinine clearance is 56.4 mL/min (by C-G formula based on Cr of 1.49). ------------------------------------------------------------------------------------------------------------------ No results for input(s): HGBA1C in the last 72 hours. ------------------------------------------------------------------------------------------------------------------ No results for input(s): CHOL, HDL, LDLCALC, TRIG, CHOLHDL, LDLDIRECT in the last 72 hours. ------------------------------------------------------------------------------------------------------------------ No results for input(s): TSH, T4TOTAL, T3FREE, THYROIDAB in the last 72 hours.  Invalid  input(s): FREET3 ------------------------------------------------------------------------------------------------------------------ No results for input(s): VITAMINB12, FOLATE, FERRITIN, TIBC, IRON, RETICCTPCT in the last 72 hours.  Coagulation profile  Recent Labs Lab 03/14/15 1920  INR 1.59*    No results for input(s): DDIMER in the last 72 hours.  Cardiac Enzymes No results for input(s): CKMB, TROPONINI, MYOGLOBIN in the last 168 hours.  Invalid input(s): CK ------------------------------------------------------------------------------------------------------------------ Invalid input(s): POCBNP   CBG:  Recent Labs Lab 03/19/15 2127 03/20/15 0717 03/20/15 1150 03/20/15 1620 03/20/15 2120  GLUCAP 238* 240* 247* 133* 260*       Studies: Dg Chest 2 View  03/19/2015  CLINICAL DATA:  Shortness of breath and weakness EXAM: CHEST  2 VIEW COMPARISON:  03/16/2015 FINDINGS: There is a left chest wall ICD with lead in the right  ventricle. The right arm PICC line tip is in the cavoatrial junction. Previous median sternotomy and CABG procedure. Bilateral pleural effusions are noted right greater in left. There is mild diffuse pulmonary edema identified. IMPRESSION: 1. Moderate CHF.  Unchanged from previous exam. Electronically Signed   By: Signa Kell M.D.   On: 03/19/2015 09:41      Lab Results  Component Value Date   HGBA1C 8.8* 03/15/2015   HGBA1C 6.9 12/22/2014   HGBA1C 8.1* 11/12/2014   Lab Results  Component Value Date   LDLCALC 52 08/05/2014   CREATININE 1.49* 03/21/2015       Scheduled Meds: . amiodarone  200 mg Oral BID  . atorvastatin  40 mg Oral q1800  . carvedilol  3.125 mg Oral BID WC  . digoxin  0.0625 mg Oral Q48H  . docusate sodium  100 mg Oral Daily  . feeding supplement (ENSURE ENLIVE)  237 mL Oral BID BM  . ferrous sulfate  325 mg Oral TID WC  . furosemide  80 mg Oral BID  . glipiZIDE  5 mg Oral BID AC  . insulin aspart  0-15 Units  Subcutaneous TID WC  . insulin aspart  0-5 Units Subcutaneous QHS  . isosorbide-hydrALAZINE  0.5 tablet Oral TID  . magnesium sulfate 1 - 4 g bolus IVPB  1 g Intravenous Once  . mometasone-formoterol  2 puff Inhalation BID  . pantoprazole  40 mg Oral BID  . rivaroxaban  20 mg Oral Daily  . sodium chloride  10-40 mL Intracatheter Q12H  . spironolactone  25 mg Oral Daily   Continuous Infusions: . milrinone 0.375 mcg/kg/min (03/21/15 0055)    Active Problems:   Atrial fibrillation with RVR (HCC)   SOB (shortness of breath)   Morbid obesity due to excess calories (HCC)   Acute on chronic systolic (congestive) heart failure (HCC)   Cardiorenal syndrome with renal failure    Time spent: 45 minutes   [facility]  [facility] Pager [phone]. If 7PM-7AM, please contact night-coverage at [email or web address], password [facility] 03/21/2015, 5:43 AM  LOS: 7 days

## 2015-03-21 NOTE — Progress Notes (Signed)
Coox 64% with decrease of milrinone to 0.25.   May decrease milrinone further in am. Continue current dose for now. Want to wean slowly.   Casimiro Needle [address]" [place], [place] 03/21/2015 12:33 PM

## 2015-03-21 NOTE — Hospital Discharge Follow-Up (Signed)
The patient has been active with the [facility] ( [facility] at the [facility].  Attempted to meet with the patient this afternoon but he was sleeping.    Will continue to follow his hospitalization.

## 2015-03-22 LAB — COMPREHENSIVE METABOLIC PANEL
ALBUMIN: 3.3 g/dL — AB (ref 3.5–5.0)
ALK PHOS: 169 U/L — AB (ref 38–126)
ALT: 15 U/L — ABNORMAL LOW (ref 17–63)
ANION GAP: 10 (ref 5–15)
AST: 25 U/L (ref 15–41)
BUN: 42 mg/dL — ABNORMAL HIGH (ref 6–20)
CHLORIDE: 87 mmol/L — AB (ref 101–111)
CO2: 31 mmol/L (ref 22–32)
Calcium: 8.9 mg/dL (ref 8.9–10.3)
Creatinine, Ser: 1.37 mg/dL — ABNORMAL HIGH (ref 0.61–1.24)
GFR calc non Af Amer: 55 mL/min — ABNORMAL LOW (ref >60)
GLUCOSE: 205 mg/dL — AB (ref 65–99)
POTASSIUM: 4.3 mmol/L (ref 3.5–5.1)
SODIUM: 128 mmol/L — AB (ref 135–145)
Total Bilirubin: 1.5 mg/dL — ABNORMAL HIGH (ref 0.3–1.2)
Total Protein: 7.5 g/dL (ref 6.5–8.1)

## 2015-03-22 LAB — CARBOXYHEMOGLOBIN
CARBOXYHEMOGLOBIN: 2.4 % — AB (ref 0.5–1.5)
METHEMOGLOBIN: 0.7 % (ref 0.0–1.5)
O2 SAT: 63.8 %
TOTAL HEMOGLOBIN: 11.1 g/dL — AB (ref 13.5–18.0)

## 2015-03-22 LAB — GLUCOSE, CAPILLARY
GLUCOSE-CAPILLARY: 78 mg/dL (ref 65–99)
GLUCOSE-CAPILLARY: 261 mg/dL — AB (ref 65–99)
Glucose-Capillary: 149 mg/dL — ABNORMAL HIGH (ref 65–99)
Glucose-Capillary: 358 mg/dL — ABNORMAL HIGH (ref 65–99)

## 2015-03-22 MED ORDER — GLIPIZIDE 10 MG PO TABS
10.0000 mg | ORAL_TABLET | Freq: Two times a day (BID) | ORAL | Status: DC
  Administered 2015-03-22 – 2015-03-23 (×2): 10 mg via ORAL
  Filled 2015-03-22 (×5): qty 1

## 2015-03-22 MED ORDER — ISOSORB DINITRATE-HYDRALAZINE 20-37.5 MG PO TABS
1.0000 | ORAL_TABLET | Freq: Three times a day (TID) | ORAL | Status: DC
  Administered 2015-03-22 – 2015-03-23 (×4): 1 via ORAL
  Filled 2015-03-22 (×4): qty 1

## 2015-03-22 NOTE — Progress Notes (Signed)
Inpatient Diabetes Program Recommendations  AACE/ADA: New Consensus Statement on Inpatient Glycemic Control (2015)  Target Ranges:  Prepandial:   less than 140 mg/dL      Peak postprandial:   less than 180 mg/dL (1-2 hours)      Critically ill patients:  140 - 180 mg/dL   Review of Glycemic Control  Results for Jacob [name], Jacob [name] (MRN [id number]) as of 03/22/2015 10:22  Ref. Range 03/20/2015 03:31 03/21/2015 04:54 03/22/2015 04:30  Glucose Latest Ref Range: 65-99 mg/dL 410 (H) 243 (H) 205 (H)  Results for Jacob [name], Jacob [name] (MRN [id number]) as of 03/22/2015 10:22  Ref. Range 03/15/2015 04:40  Hemoglobin A1C Latest Ref Range: 4.8-5.6 % 8.8 (H)   Would likely benefit from addition of basal/bolus insulin at home.   Inpatient Diabetes Program Recommendations: Consider addition of Lantus 10 units QHS with FBS of 205. Consider adding meal coverage insulin - Novolog 3 units tidwc.  If pt is to go home on insulin, will order insulin starter kit and RN to begin teaching insulin administration.  Will continue to follow. Thank you. Lorenda Peck, RD, LDN, CDE Inpatient Diabetes Coordinator [phone]

## 2015-03-22 NOTE — Progress Notes (Addendum)
ADVANCED HF TEAM PROGRESS NOTE  He had  prolonged hospitalization in 9/16 for cardiogenic shock. AF with RVR and GI bleed. During that admission required dual inotropes (milrinone/levophed) but was able to be weaned. Failed DC-CV of AF and tolerated rate control.   He has iCM and Echo 11/24/14 showed EF 30-35%. Readmitted with recurrent Class IV HF symptoms with marked volume overload (weight up nearly 30 pounds from previous d/c) and AF with RVR 115-120.   Milrinone decreased to 0.25 yesterday. Co-ox remains 64%.. Switched to po diuretics 03/19/15. Weight down another pound. Creatinine stable. Denies SOB or CP.  Anxious to go home.  AF rate remains in 100-110 despite amio.    Scheduled Meds: . amiodarone  200 mg Oral BID  . atorvastatin  40 mg Oral q1800  . carvedilol  3.125 mg Oral BID WC  . digoxin  0.0625 mg Oral Q48H  . docusate sodium  100 mg Oral Daily  . feeding supplement (ENSURE ENLIVE)  237 mL Oral BID BM  . ferrous sulfate  325 mg Oral TID WC  . furosemide  80 mg Oral BID  . glipiZIDE  5 mg Oral BID AC  . insulin aspart  0-15 Units Subcutaneous TID WC  . insulin aspart  0-5 Units Subcutaneous QHS  . isosorbide-hydrALAZINE  0.5 tablet Oral TID  . mometasone-formoterol  2 puff Inhalation BID  . pantoprazole  40 mg Oral BID  . rivaroxaban  20 mg Oral Daily  . sodium chloride  10-40 mL Intracatheter Q12H  . spironolactone  25 mg Oral Daily   Continuous Infusions: . milrinone 0.25 mcg/kg/min (03/21/15 2310)   PRN Meds:.levalbuterol, sodium chloride, traMADol  On exam Filed Vitals:   03/22/15 0356 03/22/15 0726  BP: 128/71 119/88  Pulse:    Temp: 98.7 F (37.1 C) 98.6 F (37 C)  Resp: 19 18   PHYSICAL EXAM: General: NAD  HEENT: normal Neck: supple. JVP 5-6  . Carotids 2+ bilaterally; no bruits. No thyromegaly or nodule noted.  Cor: PMI normal. Irregular rate & rhythm. Tachy No M/G/R. Lungs: CTAB, normal effort. Abdomen: soft, NT, ND, no HSM. No bruits or  masses. +BS  Extremities: no cyanosis, clubbing, rash, tr edema Neuro: alert & orientedx3, cranial nerves grossly intact. Moves all 4 extremities w/o difficulty. Affect pleasant.   Results for orders placed or performed during the hospital encounter of 03/14/15 (from the past 24 hour(s))  Glucose, capillary     Status: Abnormal   Collection Time: 03/21/15  8:42 AM  Result Value Ref Range   Glucose-Capillary 238 (H) 65 - 99 mg/dL   Comment 1 Capillary Specimen   Glucose, capillary     Status: Abnormal   Collection Time: 03/21/15 11:59 AM  Result Value Ref Range   Glucose-Capillary 164 (H) 65 - 99 mg/dL   Comment 1 Venous Specimen    Comment 2 Notify RN   Carboxyhemoglobin     Status: Abnormal   Collection Time: 03/21/15 12:00 PM  Result Value Ref Range   Total hemoglobin 11.2 (L) 13.5 - 18.0 g/dL   O2 Saturation 13.2 %   Carboxyhemoglobin 2.7 (H) 0.5 - 1.5 %   Methemoglobin 0.6 0.0 - 1.5 %  Glucose, capillary     Status: Abnormal   Collection Time: 03/21/15  5:28 PM  Result Value Ref Range   Glucose-Capillary 206 (H) 65 - 99 mg/dL   Comment 1 Capillary Specimen   Glucose, capillary     Status: Abnormal  Collection Time: 03/21/15  9:39 PM  Result Value Ref Range   Glucose-Capillary 261 (H) 65 - 99 mg/dL   Comment 1 Capillary Specimen   Comprehensive metabolic panel     Status: Abnormal   Collection Time: 03/22/15  4:30 AM  Result Value Ref Range   Sodium 128 (L) 135 - 145 mmol/L   Potassium 4.3 3.5 - 5.1 mmol/L   Chloride 87 (L) 101 - 111 mmol/L   CO2 31 22 - 32 mmol/L   Glucose, Bld 205 (H) 65 - 99 mg/dL   BUN 42 (H) 6 - 20 mg/dL   Creatinine, Ser 4.35 (H) 0.61 - 1.24 mg/dL   Calcium 8.9 8.9 - 68.6 mg/dL   Total Protein 7.5 6.5 - 8.1 g/dL   Albumin 3.3 (L) 3.5 - 5.0 g/dL   AST 25 15 - 41 U/L   ALT 15 (L) 17 - 63 U/L   Alkaline Phosphatase 169 (H) 38 - 126 U/L   Total Bilirubin 1.5 (H) 0.3 - 1.2 mg/dL   GFR calc non Af Amer 55 (L) >60 mL/min   GFR calc Af Amer >60  >60 mL/min   Anion gap 10 5 - 15  Carboxyhemoglobin     Status: Abnormal   Collection Time: 03/22/15  4:35 AM  Result Value Ref Range   Total hemoglobin 11.1 (L) 13.5 - 18.0 g/dL   O2 Saturation 16.8 %   Carboxyhemoglobin 2.4 (H) 0.5 - 1.5 %   Methemoglobin 0.7 0.0 - 1.5 %  Glucose, capillary     Status: Abnormal   Collection Time: 03/22/15  7:26 AM  Result Value Ref Range   Glucose-Capillary 149 (H) 65 - 99 mg/dL   Comment 1 Capillary Specimen      Assessment: 1) Cardiogenic shock 2) A/c systolic HF 3) Acute respiratory failure 4) Cardiorenal syndrome with acute renal failure due to #1 5) Chronic AF - failed DC-CV on amio in 9/16 6) Hypokalemia 7) Hyponatremia  Plan/discussion:  Volume status looks good. Continues to diurese on po diuretics and milrinone. Renal function stable. Continue to wean milrinone slowly will decrease to 0.125. HR still somewhat  fast despite amio, dig and low dose carvedilol. May need EP to see regarding possible AVN ablation/CRT-D. Failed DC-CV in 9/16. Will see what HR does will decreased milrinone. I am hesitant to increase carvedilol further at this point.   K stable.  No ACE/ARB with renal failure. Continue to titrate hydralazine/nitrates. (added 03/20/15)   Continue Xarelto.  Hopefully home soon. Appreciate [facility] help.   He says he needs to go home tomorrow. Will decrease milrinone to 0.125 and increase hydral/nitrates. If co-ox ok tomorrow. Will turn milrinone off and let him go with very close outpatient f/u.  Arvilla Meres, MD  8:18 AM   Advanced Heart Failure Team Pager [phone] (M-F; 7a - 4p)  Please contact [facility] for night-coverage after hours (4p -7a ) and weekends on [email or web address]

## 2015-03-22 NOTE — Hospital Discharge Follow-Up (Signed)
The patient has been followed at the [facility] ( [facility]) at the [facility].  He has an appointment with Dr Jarold Song on 03/28/15 @ 1400. Met with him briefly and he then said that he was tired and wanted to sleep.  Will continue to follow his hospitalization.   Update provided to Elissa Hefty, RN CM.

## 2015-03-22 NOTE — Progress Notes (Signed)
[facility] PROGRESS NOTE  Jacob [name] GEX:[id number] DOB: 1955-04-29 DOA: 03/14/2015 PCP: [name], MD  Length of stay: 8  Brief narrative: [name] with hx of HTN, HLD, DM2, CAD s/p 2V CABG w/ MAZE, ischemic cardiomyopathy EF 30% s/p [place] ICD ([employer]), long standing persistent atrial fibrillation, poorly rate-controlled (clinic HRs in December 130s and up), on xarelto, who presents in decompensated heart failure.  Per clinic note on 02/27/15 he had gained 10 pounds and his lasix was increased from 60 BID to 120 BID.  He states he missed his lasix dose this morning, but has had worsening HF over the past week, notably with increasing DOE (can only walk a few feet before SOB), orthopnea, and frequent PND.  He had one sustained episode of chest pressure that lasted hours this morning, but this has resolved and initial troponin is negative. He denies any salty meals, and states other than missing lasix today, he has been adherent to all meds, including Xarelto (adamant he takes daily).   Assessment/Plan: Active Problems:   Atrial fibrillation with RVR (HCC)   SOB (shortness of breath)   Morbid obesity due to excess calories (HCC)   Acute on chronic systolic (congestive) heart failure (HCC)   Cardiorenal syndrome with renal failure     Acute on chronic systolic heart failure Transferred to 2H SDU for IV milrinone and monitoring of CVPs and co-ox. On 12/22  last admission 9/16, and patient required CVP monitoring, was found to be in cardiogenic shock and required vasopressors and milrinone PO diuretics creatinine improving after starting milrinone, likely has cardiorenal syndrome Restarted Aldactone 12/25 Continue digoxin,  milrinone, Lasix, heart failure team following, started milrinone 12/22, now weaning milrinone Volume status continues to improve with high-dose milrinone and po diuresis     Hyponatremia-sodium 134> 126>128, likely in the setting of  heart failure, Zaroxolyn, diuresis,  Long-standing persistent AF, still uncontrolled - Continue xarelto ,CHADSVASC2 of 4 - prior attempt at rhythm control with amiodarone this summer was unsuccessful (see hospital notes 11/2014) Xarelto was held in setting of GI bleed and restarted on September 8 Patient also has an ICD   Started amiodarone 12/23, Rate still fast despite digoxin and iv amio. Cardiology managing amiodarone  metoprolol discontinued by cardiology due to cardiogenic shock,   Restarted Xarelto.   Diabetes mellitus type II: Hemoglobin A1c 8.8 Increase glipizide (Crcl> 50)-- patient not wanting to start lantus at home-- discussed at length Per nursing, not compliant with diet-- eats a lot   COPD, stable, question right basilar infiltrate, discontinue Rocephin and doxycycline, completed 5 days of antibiotics. Repeat chest x-ray shows moderate CHF, no pneumonia, antibiotics discontinued  DVT prophylaxsis Xarelto  Code Status:    Family Communication: Discussed in detail with the patient, all imaging results, lab results explained to the patient   Disposition Plan: home when ok with HF team     Consultants:  Cardiology  Procedures:  None  Antibiotics: Anti-infectives    Start     Dose/Rate Route Frequency Ordered Stop   03/15/15 1200  cefTRIAXone (ROCEPHIN) 1 g in dextrose 5 % 50 mL IVPB  Status:  Discontinued     1 g 100 mL/hr over 30 Minutes Intravenous Every 24 hours 03/15/15 1056 03/19/15 1030   03/15/15 1200  doxycycline (VIBRAMYCIN) 100 mg in dextrose 5 % 250 mL IVPB  Status:  Discontinued     100 mg 125 mL/hr over 120 Minutes Intravenous Every 12 hours 03/15/15 1056  03/19/15 1030   03/15/15 0900  levofloxacin (LEVAQUIN) IVPB 750 mg  Status:  Discontinued     750 mg 100 mL/hr over 90 Minutes Intravenous Every 24 hours 03/15/15 0831 03/15/15 1056         HPI/Subjective: Anxious to go home  Objective: Filed Vitals:   03/22/15 0356 03/22/15 0600  03/22/15 0726 03/22/15 1202  BP: 128/71  119/88 108/61  Pulse:      Temp: 98.7 F (37.1 C)  98.6 F (37 C) 98.5 F (36.9 C)  TempSrc: Oral  Oral Oral  Resp: Height:      Weight:  91.9 kg (202 lb 9.6 oz)    SpO2: 100%  99% 98%    Intake/Output Summary (Last 24 hours) at 03/22/15 1410 Last data filed at 03/22/15 1020  Gross per 24 hour  Intake 360.91 ml  Output   1300 ml  Net -939.09 ml    Exam:  General: awake, NAD HEENT: normal Cor: Irregular rate & rhythm. No rubs, gallops or murmurs. Lungs: clear Abdomen: soft, nontender, nondistended. No hepatosplenomegaly. No bruits or masses. Good bowel sounds. Extremities: no cyanosis, clubbing, rash, R and LLE 1-2+ edema Neuro: sleeping, easily awoken  Data Review   Micro Results Recent Results (from the past 240 hour(s))  MRSA PCR Screening     Status: None   Collection Time: 03/16/15  3:45 PM  Result Value Ref Range Status   MRSA by PCR NEGATIVE NEGATIVE Final    Comment:        The GeneXpert MRSA Assay (FDA approved for NASAL specimens only), is one component of a comprehensive MRSA colonization surveillance program. It is not intended to diagnose MRSA infection nor to guide or monitor treatment for MRSA infections.     Radiology Reports Dg Chest 2 View  03/19/2015  CLINICAL DATA:  Shortness of breath and weakness EXAM: CHEST  2 VIEW COMPARISON:  03/16/2015 FINDINGS: There is a left chest wall ICD with lead in the right ventricle. The right arm PICC line tip is in the cavoatrial junction. Previous median sternotomy and CABG procedure. Bilateral pleural effusions are noted right greater in left. There is mild diffuse pulmonary edema identified. IMPRESSION: 1. Moderate CHF.  Unchanged from previous exam. Electronically Signed   By: Signa Kell M.D.   On: 03/19/2015 09:41   Dg Chest 2 View  03/14/2015  CLINICAL DATA:  Chest pain for 1 day EXAM: CHEST - 2 VIEW COMPARISON:  10/19/6 pain FINDINGS:  Cardiac shadow is prominent. Postsurgical changes are again seen. A defibrillator is again noted. New right basilar infiltrate with associated effusion is seen. Mild vascular congestion is noted as well. IMPRESSION: Right basilar infiltrate with associated effusion as well as mild vascular congestion. Electronically Signed   By: Alcide Clever M.D.   On: 03/14/2015 20:34   Dg Chest Port 1 View  03/16/2015  CLINICAL DATA:  Central line placement EXAM: PORTABLE CHEST 1 VIEW COMPARISON:  03/14/2015 FINDINGS: Cardiomediastinal silhouette is stable. Status post median sternotomy. Single lead cardiac pacemaker is unchanged in position. Persistent small right pleural effusion with right basilar atelectasis or infiltrate. Central mild vascular congestion. There is a right arm/right subclavian central line with tip in the lower SVC. Please note the tip of the central line is not clearly identified due to patient's large body habitus and overlying chest wall artifacts. There is no pneumothorax. IMPRESSION: Persistent small right pleural effusion with right basilar atelectasis or infiltrate. Central mild vascular  congestion. There is a right arm/right subclavian central line with tip in the lower SVC. Please note the tip of the central line is not clearly identified due to patient's large body habitus and overlying chest wall artifacts. There is no pneumothorax. Electronically Signed   By: Natasha Mead M.D.   On: 03/16/2015 15:52     CBC  Recent Labs Lab 03/16/15 0340 03/17/15 0431 03/19/15 0425 03/21/15 0454  WBC 9.2 9.1 9.7 8.1  HGB 10.5* 10.0* 10.4* 11.0*  HCT 39.0 35.8* 37.0* 39.1  PLT 308 285 283 291  MCV 69.9* 68.5* 68.1* 69.1*  MCH 18.8* 19.1* 19.2* 19.4*  MCHC 26.9* 27.9* 28.1* 28.1*  RDW 23.6* 23.6* 24.6* 25.5*    Chemistries   Recent Labs Lab 03/16/15 0025  03/17/15 0431 03/18/15 0430 03/19/15 0425 03/20/15 0331 03/21/15 0454 03/22/15 0430  NA  --   < > 130* 128* 126* 126* 128* 128*   K  --   < > 4.1 3.4* 4.3 4.6 4.2 4.3  CL  --   < > 90* 85* 82* 85* 86* 87*  CO2  --   < > 31 32 32 31 32 31  GLUCOSE  --   < > 246* 276* 259* 410* 243* 205*  BUN  --   < > 39* 38* 38* 34* 39* 42*  CREATININE  --   < > 1.85* 1.68* 1.81* 1.64* 1.49* 1.37*  CALCIUM  --   < > 8.3* 8.3* 8.4* 8.3* 8.7* 8.9  MG 1.8  --   --  1.5* 2.1  --  1.7  --   AST  --   < > 22 16  --  ALT  --   < > 13* 14*  --  12* 13* 15*  ALKPHOS  --   < > 155* 130*  --  158* 157* 169*  BILITOT  --   < > 1.7* 1.5*  --  1.5* 1.4* 1.5*  < > = values in this interval not displayed. ------------------------------------------------------------------------------------------------------------------ estimated creatinine clearance is 61.3 mL/min (by C-G formula based on Cr of 1.37). ------------------------------------------------------------------------------------------------------------------ No results for input(s): HGBA1C in the last 72 hours. ------------------------------------------------------------------------------------------------------------------ No results for input(s): CHOL, HDL, LDLCALC, TRIG, CHOLHDL, LDLDIRECT in the last 72 hours. ------------------------------------------------------------------------------------------------------------------ No results for input(s): TSH, T4TOTAL, T3FREE, THYROIDAB in the last 72 hours.  Invalid input(s): FREET3 ------------------------------------------------------------------------------------------------------------------ No results for input(s): VITAMINB12, FOLATE, FERRITIN, TIBC, IRON, RETICCTPCT in the last 72 hours.  Coagulation profile No results for input(s): INR, PROTIME in the last 168 hours.  No results for input(s): DDIMER in the last 72 hours.  Cardiac Enzymes No results for input(s): CKMB, TROPONINI, MYOGLOBIN in the last 168 hours.  Invalid input(s):  CK ------------------------------------------------------------------------------------------------------------------ Invalid input(s): POCBNP   CBG:  Recent Labs Lab 03/21/15 1159 03/21/15 1728 03/21/15 2139 03/22/15 0726 03/22/15 1156  GLUCAP 164* 206* 261* 149* 358*       Studies: No results found.    Lab Results  Component Value Date   HGBA1C 8.8* 03/15/2015   HGBA1C 6.9 12/22/2014   HGBA1C 8.1* 11/12/2014   Lab Results  Component Value Date   LDLCALC 52 08/05/2014   CREATININE 1.37* 03/22/2015       Scheduled Meds: . amiodarone  200 mg Oral BID  . atorvastatin  40 mg Oral q1800  . carvedilol  3.125 mg Oral BID WC  . digoxin  0.0625 mg Oral Q48H  . docusate sodium  100 mg Oral Daily  .  feeding supplement (ENSURE ENLIVE)  237 mL Oral BID BM  . ferrous sulfate  325 mg Oral TID WC  . furosemide  80 mg Oral BID  . glipiZIDE  10 mg Oral BID AC  . insulin aspart  0-15 Units Subcutaneous TID WC  . insulin aspart  0-5 Units Subcutaneous QHS  . isosorbide-hydrALAZINE  1 tablet Oral TID  . mometasone-formoterol  2 puff Inhalation BID  . pantoprazole  40 mg Oral BID  . rivaroxaban  20 mg Oral Daily  . sodium chloride  10-40 mL Intracatheter Q12H  . spironolactone  25 mg Oral Daily   Continuous Infusions: . milrinone 0.125 mcg/kg/min (03/22/15 0911)    Active Problems:   Atrial fibrillation with RVR (HCC)   SOB (shortness of breath)   Morbid obesity due to excess calories (HCC)   Acute on chronic systolic (congestive) heart failure (HCC)   Cardiorenal syndrome with renal failure    Time spent: 25 minutes   Stana [facility]  [facility] Pager [phone]. If 7PM-7AM, please contact night-coverage at [email or web address], password [facility] 03/22/2015, 2:10 PM  LOS: 8 days

## 2015-03-23 DIAGNOSIS — E118 Type 2 diabetes mellitus with unspecified complications: Secondary | ICD-10-CM

## 2015-03-23 DIAGNOSIS — Z794 Long term (current) use of insulin: Secondary | ICD-10-CM

## 2015-03-23 LAB — CARBOXYHEMOGLOBIN
CARBOXYHEMOGLOBIN: 2.9 % — AB (ref 0.5–1.5)
Carboxyhemoglobin: 2.3 % — ABNORMAL HIGH (ref 0.5–1.5)
METHEMOGLOBIN: 0.8 % (ref 0.0–1.5)
Methemoglobin: 0.8 % (ref 0.0–1.5)
O2 Saturation: 63.7 %
O2 Saturation: 83.9 %
TOTAL HEMOGLOBIN: 10.9 g/dL — AB (ref 13.5–18.0)
Total hemoglobin: 11.1 g/dL — ABNORMAL LOW (ref 13.5–18.0)

## 2015-03-23 LAB — BASIC METABOLIC PANEL
Anion gap: 7 (ref 5–15)
BUN: 51 mg/dL — AB (ref 6–20)
CHLORIDE: 92 mmol/L — AB (ref 101–111)
CO2: 30 mmol/L (ref 22–32)
Calcium: 8.6 mg/dL — ABNORMAL LOW (ref 8.9–10.3)
Creatinine, Ser: 1.27 mg/dL — ABNORMAL HIGH (ref 0.61–1.24)
GFR calc Af Amer: >60 mL/min (ref >60)
GFR calc non Af Amer: >60 mL/min (ref >60)
GLUCOSE: 214 mg/dL — AB (ref 65–99)
POTASSIUM: 4.2 mmol/L (ref 3.5–5.1)
Sodium: 129 mmol/L — ABNORMAL LOW (ref 135–145)

## 2015-03-23 LAB — GLUCOSE, CAPILLARY
GLUCOSE-CAPILLARY: 135 mg/dL — AB (ref 65–99)
GLUCOSE-CAPILLARY: 174 mg/dL — AB (ref 65–99)

## 2015-03-23 MED ORDER — ISOSORB DINITRATE-HYDRALAZINE 20-37.5 MG PO TABS: 1.0000 | ORAL_TABLET | Freq: Three times a day (TID) | ORAL | Status: DC

## 2015-03-23 MED ORDER — AMIODARONE HCL 200 MG PO TABS: 200.0000 mg | ORAL_TABLET | Freq: Two times a day (BID) | ORAL | Status: DC

## 2015-03-23 MED ORDER — CARVEDILOL 3.125 MG PO TABS: 3.1250 mg | ORAL_TABLET | Freq: Two times a day (BID) | ORAL | Status: DC

## 2015-03-23 MED ORDER — DIGOXIN 62.5 MCG PO TABS: 0.0625 mg | ORAL_TABLET | ORAL | Status: DC

## 2015-03-23 MED ORDER — SPIRONOLACTONE 25 MG PO TABS: 25.0000 mg | ORAL_TABLET | Freq: Every day | ORAL | Status: DC

## 2015-03-23 MED ORDER — MOMETASONE FURO-FORMOTEROL FUM 100-5 MCG/ACT IN AERO: 2.0000 | INHALATION_SPRAY | Freq: Two times a day (BID) | RESPIRATORY_TRACT | Status: DC

## 2015-03-23 MED ORDER — GLIPIZIDE 10 MG PO TABS: 10.0000 mg | ORAL_TABLET | Freq: Two times a day (BID) | ORAL | Status: DC

## 2015-03-23 MED ORDER — FUROSEMIDE 80 MG PO TABS: 80.0000 mg | ORAL_TABLET | Freq: Two times a day (BID) | ORAL | Status: DC

## 2015-03-23 NOTE — Progress Notes (Signed)
ADVANCED HF TEAM PROGRESS NOTE  He had prolonged hospitalization in 9/16 for cardiogenic shock. AF with RVR and GI bleed. During that admission required dual inotropes (milrinone/levophed) but was able to be weaned. Failed DC-CV of AF and tolerated rate control.   He has iCM and Echo 11/24/14 showed EF 30-35%. Readmitted with recurrent Class IV HF symptoms with marked volume overload (weight up nearly 30 pounds from previous d/c) and AF with RVR 115-120. Placed on milrinone up to 0.375 and IV lasix up to 80 mg BID. Amio rebolused and placed on drip. Will go home on po amio.   Milrinone decreased to 0.125 yesterday. Co-ox 63.7%. Stable on po diuretics since 03/19/15. Weight shows up 3 lbs despite ~ 1L output x 24 hours.  Creatinine continues to trend down. No complaints this am. Denies SOB or CP.   Overall out 11L and down 29 lbs.  AF rate remains in 100-110 despite amio.   Scheduled Meds: . amiodarone  200 mg Oral BID  . atorvastatin  40 mg Oral q1800  . carvedilol  3.125 mg Oral BID WC  . digoxin  0.0625 mg Oral Q48H  . docusate sodium  100 mg Oral Daily  . feeding supplement (ENSURE ENLIVE)  237 mL Oral BID BM  . ferrous sulfate  325 mg Oral TID WC  . furosemide  80 mg Oral BID  . glipiZIDE  10 mg Oral BID AC  . insulin aspart  0-15 Units Subcutaneous TID WC  . insulin aspart  0-5 Units Subcutaneous QHS  . isosorbide-hydrALAZINE  1 tablet Oral TID  . mometasone-formoterol  2 puff Inhalation BID  . pantoprazole  40 mg Oral BID  . rivaroxaban  20 mg Oral Daily  . sodium chloride  10-40 mL Intracatheter Q12H  . spironolactone  25 mg Oral Daily   Continuous Infusions:   PRN Meds:.levalbuterol, sodium chloride, traMADol  On exam Filed Vitals:   03/23/15 0415 03/23/15 0416  BP: 110/62 110/62  Pulse:    Temp: 98.1 F (36.7 C)   Resp: 23 16   PHYSICAL EXAM: General: NAD  HEENT: normal Neck: supple. JVP not elevated. Carotids 2+ bilaterally; no bruits. No thyromegaly or  lymphadenopathy noted.  Cor: PMI normal. Irregular rate & rhythm. Tachy No M/G/R. Lungs: Clear Abdomen: soft, nontender, nondistended no HSM. No bruits or masses. +BS  Extremities: no cyanosis, clubbing, rash, trace ankle edema Neuro: alert & orientedx3, cranial nerves grossly intact. Moves all 4 extremities w/o difficulty. Affect pleasant.   Results for orders placed or performed during the hospital encounter of 03/14/15 (from the past 24 hour(s))  Glucose, capillary     Status: Abnormal   Collection Time: 03/22/15 11:56 AM  Result Value Ref Range   Glucose-Capillary 358 (H) 65 - 99 mg/dL   Comment 1 Capillary Specimen   Glucose, capillary     Status: None   Collection Time: 03/22/15  3:58 PM  Result Value Ref Range   Glucose-Capillary 78 65 - 99 mg/dL   Comment 1 Capillary Specimen   Glucose, capillary     Status: Abnormal   Collection Time: 03/22/15  9:44 PM  Result Value Ref Range   Glucose-Capillary 261 (H) 65 - 99 mg/dL   Comment 1 Capillary Specimen   Carboxyhemoglobin     Status: Abnormal   Collection Time: 03/23/15  6:13 AM  Result Value Ref Range   Total hemoglobin 10.9 (L) 13.5 - 18.0 g/dL   O2 Saturation 55.7 %  Carboxyhemoglobin 2.3 (H) 0.5 - 1.5 %   Methemoglobin 0.8 0.0 - 1.5 %  Basic metabolic panel     Status: Abnormal   Collection Time: 03/23/15  6:25 AM  Result Value Ref Range   Sodium 129 (L) 135 - 145 mmol/L   Potassium 4.2 3.5 - 5.1 mmol/L   Chloride 92 (L) 101 - 111 mmol/L   CO2 30 22 - 32 mmol/L   Glucose, Bld 214 (H) 65 - 99 mg/dL   BUN 51 (H) 6 - 20 mg/dL   Creatinine, Ser 1.61 (H) 0.61 - 1.24 mg/dL   Calcium 8.6 (L) 8.9 - 10.3 mg/dL   GFR calc non Af Amer >60 >60 mL/min   GFR calc Af Amer >60 >60 mL/min   Anion gap 7 5 - 15     Assessment: 1) Cardiogenic shock 2) A/c systolic HF 3) Acute respiratory failure 4) Cardiorenal syndrome with acute renal failure due to #1 5) Chronic AF - failed DC-CV on amio in 9/16 6) Hypokalemia -  resolved 7) Hyponatremia - improved and stable  Plan/discussion:  Volume status stable on po diuretics and weaning of milrinone. K stable. No ACE/ARB with renal failure. Will continue to titrate hydralazine/nitrates as able in outpatient setting. SBP 110 this am, will not increase today. (added 03/20/15)  Hesitant to increase carvedilol further with recent low output. With chronic afib, could benefit from referal to EP for possible AVN ablation/CRT-D. Failed DCCV in 11/2014. Will go home on Amio 200 mg BID. Can decrease to 200 mg daily at follow up next week.   Continue Xarelto.  States he needs to go home today. Coox stable. Will d/c milrinone and follow up closely in clinic next week on 03/29/14 at 1020. Appreciate [facility] help.   He is not a good candidate for home milrinone with poor social support.  Graciella Freer, PA-C 7:45 AM   Advanced Heart Failure Team Pager [phone] (M-F; 7a - 4p)  Please contact [facility] for night-coverage after hours (4p -7a ) and weekends on [email or web address]  Patient seen with PA, agree with the above note.  Co-ox 64% this morning.  We stopped milrinone this morning.  He insists that he needs to go home today.  He is feeling good, CVP 5.    We will watch him until this afternoon, if he remains stable will discharge.  Home cardiac meds: amiodarone 200 bid, Coreg 3.125 bid, Bidil 1 tab tid, digoxin 0.0625 q48 hrs, Xarelto 20, spironolactone 25 daily, Lasix 80 mg po bid, KCl 20 daily.  Will arrange for followup in CHF clinic next week.   Marca Ancona  03/23/2015 8:36 AM

## 2015-03-23 NOTE — Progress Notes (Signed)
Pt became extremely agitated, was going to leave with PICC in his arm . I was able to talk pt into waiting for IV team. Pt only waited 10 minutes after the PICC was removed to leave. I educated him on risk and he stated he understood. Pt dc with belongings and dc instructions and voucher. Tammy Sours

## 2015-03-23 NOTE — Discharge Summary (Signed)
Physician Discharge Summary  Jacob [name] ZOX:[id number] DOB: 10-10-1955 DOA: 03/14/2015  PCP: [name], MD  Admit date: 03/14/2015 Discharge date: 03/23/2015  Time spent: 35 minutes  Recommendations for Outpatient Follow-up:  1. Patient needs to be compliant with meds 2. Needs titration of DM meds   Discharge Diagnoses:  Active Problems:   Atrial fibrillation with RVR (HCC)   SOB (shortness of breath)   Morbid obesity due to excess calories (HCC)   Acute on chronic systolic (congestive) heart failure (HCC)   Cardiorenal syndrome with renal failure   Discharge Condition: improved  Diet recommendation: cards  [facility] Weights   03/21/15 0418 03/22/15 0600 03/23/15 0415  Weight: 92.216 kg (203 lb 4.8 oz) 91.9 kg (202 lb 9.6 oz) 93.078 kg (205 lb 3.2 oz)    History of present illness:  [name] with hx of HTN, HLD, DM2, CAD s/p 2V CABG w/ MAZE, ischemic cardiomyopathy EF 30% s/p [place] ICD ([employer]), long standing persistent atrial fibrillation, poorly rate-controlled (clinic HRs in December 130s and up), on xarelto, who presents in decompensated heart failure.  Per clinic note on 02/27/15 he had gained 10 pounds and his lasix was increased from 60 BID to 120 BID.  He states he missed his lasix dose this morning, but has had worsening HF over the past week, notably with increasing DOE (can only walk a few feet before SOB), orthopnea, and frequent PND.  He had one sustained episode of chest pressure that lasted hours this morning, but this has resolved and initial troponin is negative. He denies any salty meals, and states other than missing lasix today, he has been adherent to all meds, including Xarelto (adamant he takes daily).  Hospital Course:  Acute on chronic systolic heart failure Transferred to 2H SDU for IV milrinone and monitoring of CVPs and co-ox. On 12/22 last admission 9/16, and patient required CVP monitoring, was found to be in cardiogenic shock and  required vasopressors and milrinone PO diuretics creatinine improving after starting milrinone, likely has cardiorenal syndrome Restarted Aldactone 12/25 Continue digoxin, milrinone, Lasix, heart failure team following, started milrinone 12/22, now weaning milrinone Volume status continues to improve - stable for d/c per cards   Hyponatremia-sodium 134> 126>128, likely in the setting of heart failure, Zaroxolyn, diuresis,  Long-standing persistent AF, still uncontrolled - Continue xarelto ,CHADSVASC2 of 4 - prior attempt at rhythm control with amiodarone this summer was unsuccessful (see hospital notes 11/2014) Xarelto was held in setting of GI bleed and restarted on September 8 Patient also has an ICD  Started amiodarone 12/23, Rate still fast despite digoxin and iv amio. Cardiology managing amiodarone metoprolol discontinued by cardiology due to cardiogenic shock,  Restarted Xarelto.   Diabetes mellitus type II: Hemoglobin A1c 8.8 Increase glipizide (Crcl> 50)-- patient not wanting to start lantus at home-- discussed at length Per nursing, not compliant with diet-- eats a lot   COPD, stable, question right basilar infiltrate, discontinue Rocephin and doxycycline, completed 5 days of antibiotics. Repeat chest x-ray shows moderate CHF, no pneumonia, antibiotics discontinued  Procedures:    Consultations:  cards  Discharge Exam: Filed Vitals:   03/23/15 0818 03/23/15 0945  BP: 114/69 139/91  Pulse: 100   Temp: 97.6 F (36.4 C)   Resp: 21      Discharge Instructions   Discharge Instructions    (HEART FAILURE PATIENTS) Call MD:  Anytime you have any of the following symptoms: 1) 3 pound weight gain in 24 hours or 5 pounds in 1  week 2) shortness of breath, with or without a dry hacking cough 3) swelling in the hands, feet or stomach 4) if you have to sleep on extra pillows at night in order to breathe.    Complete by:  As directed      Diet - low sodium heart  healthy    Complete by:  As directed      Diet Carb Modified    Complete by:  As directed      Discharge instructions    Complete by:  As directed   Close follow up with HF clinic     Increase activity slowly    Complete by:  As directed           Current Discharge Medication List    START taking these medications   Details  amiodarone (PACERONE) 200 MG tablet Take 1 tablet (200 mg total) by mouth 2 (two) times daily. Qty: 60 tablet, Refills: 0    carvedilol (COREG) 3.125 MG tablet Take 1 tablet (3.125 mg total) by mouth 2 (two) times daily with a meal. Qty: 60 tablet, Refills: 0    isosorbide-hydrALAZINE (BIDIL) 20-37.5 MG tablet Take 1 tablet by mouth 3 (three) times daily. Qty: 90 tablet, Refills: 0    mometasone-formoterol (DULERA) 100-5 MCG/ACT AERO Inhale 2 puffs into the lungs 2 (two) times daily. Qty: 1 Inhaler, Refills: 1      CONTINUE these medications which have CHANGED   Details  digoxin 62.5 MCG TABS Take 0.0625 mg by mouth every other day. Qty: 15 tablet, Refills: 0    furosemide (LASIX) 80 MG tablet Take 1 tablet (80 mg total) by mouth 2 (two) times daily. Qty: 60 tablet, Refills: 0    glipiZIDE (GLUCOTROL) 10 MG tablet Take 1 tablet (10 mg total) by mouth 2 (two) times daily before a meal. Qty: 60 tablet, Refills: 0    spironolactone (ALDACTONE) 25 MG tablet Take 1 tablet (25 mg total) by mouth daily. Qty: 30 tablet, Refills: 0      CONTINUE these medications which have NOT CHANGED   Details  albuterol (PROVENTIL HFA;VENTOLIN HFA) 108 (90 BASE) MCG/ACT inhaler Inhale 2 puffs into the lungs every 6 (six) hours as needed for wheezing or shortness of breath. Qty: 1 Inhaler, Refills: 2   Associated Diagnoses: Dyspnea    albuterol (PROVENTIL) (2.5 MG/3ML) 0.083% nebulizer solution Take 3 mLs (2.5 mg total) by nebulization every 6 (six) hours as needed for wheezing or shortness of breath. Qty: 150 mL, Refills: 1    atorvastatin (LIPITOR) 40 MG tablet  Take 1 tablet (40 mg total) by mouth daily at 6 PM. Qty: 30 tablet, Refills: 2    docusate sodium (COLACE) 100 MG capsule Take 1 capsule (100 mg total) by mouth daily. Qty: 30 capsule, Refills: 2    ferrous sulfate 325 (65 FE) MG tablet Take 1 tablet (325 mg total) by mouth 3 (three) times daily with meals. Qty: 90 tablet, Refills: 2    pantoprazole (PROTONIX) 40 MG tablet Take 1 tablet (40 mg total) by mouth 2 (two) times daily. Qty: 60 tablet, Refills: 2    rivaroxaban (XARELTO) 20 MG TABS tablet Take 1 tablet (20 mg total) by mouth daily before supper. Qty: 30 tablet, Refills: 2    traMADol (ULTRAM) 50 MG tablet Take 1 tablet (50 mg total) by mouth every 8 (eight) hours as needed for moderate pain. Qty: 60 tablet, Refills: 1      STOP taking these medications  Fluticasone-Salmeterol (ADVAIR) 100-50 MCG/DOSE AEPB      metoprolol succinate (TOPROL-XL) 25 MG 24 hr tablet        Allergies  Allergen Reactions  . Tape Itching    Paper tape please.   Follow-up Information    Follow up with [facility] On 03/28/2015.   Why:  Transitional Care Clinic appointment on 03/28/15 at 2:00pm with Dr. Venetia Night.   Contact information:   [address] [place] [address] [phone]      Follow up with [facility] On 03/30/2015.   Specialty:  Cardiology   Why:  at 1020 for post hospital follow up. Please bring all of your medications to your visit. The code for pt parking is 1000.   Contact information:   [address] [id number] mc [place] [address] [phone]       The results of significant diagnostics from this hospitalization (including imaging, microbiology, ancillary and laboratory) are listed below for reference.    Significant Diagnostic Studies: Dg Chest 2 View  03/19/2015  CLINICAL DATA:  Shortness of breath and weakness EXAM: CHEST  2 VIEW COMPARISON:   03/16/2015 FINDINGS: There is a left chest wall ICD with lead in the right ventricle. The right arm PICC line tip is in the cavoatrial junction. Previous median sternotomy and CABG procedure. Bilateral pleural effusions are noted right greater in left. There is mild diffuse pulmonary edema identified. IMPRESSION: 1. Moderate CHF.  Unchanged from previous exam. Electronically Signed   By: Signa Kell M.D.   On: 03/19/2015 09:41   Dg Chest 2 View  03/14/2015  CLINICAL DATA:  Chest pain for 1 day EXAM: CHEST - 2 VIEW COMPARISON:  10/19/6 pain FINDINGS: Cardiac shadow is prominent. Postsurgical changes are again seen. A defibrillator is again noted. New right basilar infiltrate with associated effusion is seen. Mild vascular congestion is noted as well. IMPRESSION: Right basilar infiltrate with associated effusion as well as mild vascular congestion. Electronically Signed   By: Alcide Clever M.D.   On: 03/14/2015 20:34   Dg Chest Port 1 View  03/16/2015  CLINICAL DATA:  Central line placement EXAM: PORTABLE CHEST 1 VIEW COMPARISON:  03/14/2015 FINDINGS: Cardiomediastinal silhouette is stable. Status post median sternotomy. Single lead cardiac pacemaker is unchanged in position. Persistent small right pleural effusion with right basilar atelectasis or infiltrate. Central mild vascular congestion. There is a right arm/right subclavian central line with tip in the lower SVC. Please note the tip of the central line is not clearly identified due to patient's large body habitus and overlying chest wall artifacts. There is no pneumothorax. IMPRESSION: Persistent small right pleural effusion with right basilar atelectasis or infiltrate. Central mild vascular congestion. There is a right arm/right subclavian central line with tip in the lower SVC. Please note the tip of the central line is not clearly identified due to patient's large body habitus and overlying chest wall artifacts. There is no pneumothorax.  Electronically Signed   By: Natasha Mead M.D.   On: 03/16/2015 15:52    Microbiology: Recent Results (from the past 240 hour(s))  MRSA PCR Screening     Status: None   Collection Time: 03/16/15  3:45 PM  Result Value Ref Range Status   MRSA by PCR NEGATIVE NEGATIVE Final    Comment:        The GeneXpert MRSA Assay (FDA approved for NASAL specimens only), is one component of a  comprehensive MRSA colonization surveillance program. It is not intended to diagnose MRSA infection nor to guide or monitor treatment for MRSA infections.      Labs: Basic Metabolic Panel:  Recent Labs Lab 03/18/15 0430 03/19/15 0425 03/20/15 0331 03/21/15 0454 03/22/15 0430 03/23/15 0625  NA 128* 126* 126* 128* 128* 129*  K 3.4* 4.3 4.6 4.2 4.3 4.2  CL 85* 82* 85* 86* 87* 92*  CO2 32 32 31 32 31 30  GLUCOSE 276* 259* 410* 243* 205* 214*  BUN 38* 38* 34* 39* 42* 51*  CREATININE 1.68* 1.81* 1.64* 1.49* 1.37* 1.27*  CALCIUM 8.3* 8.4* 8.3* 8.7* 8.9 8.6*  MG 1.5* 2.1  --  1.7  --   --    Liver Function Tests:  Recent Labs Lab 03/17/15 0431 03/18/15 0430 03/20/15 0331 03/21/15 0454 03/22/15 0430  AST 22 16 21 23 25   ALT 13* 14* 12* 13* 15*  ALKPHOS 155* 130* 158* 157* 169*  BILITOT 1.7* 1.5* 1.5* 1.4* 1.5*  PROT 6.4* 6.5 6.9 7.2 7.5  ALBUMIN 2.6* 2.7* 3.0* 3.1* 3.3*   No results for input(s): LIPASE, AMYLASE in the last 168 hours. No results for input(s): AMMONIA in the last 168 hours. CBC:  Recent Labs Lab 03/17/15 0431 03/19/15 0425 03/21/15 0454  WBC 9.1 9.7 8.1  HGB 10.0* 10.4* 11.0*  HCT 35.8* 37.0* 39.1  MCV 68.5* 68.1* 69.1*  PLT 285 283 291   Cardiac Enzymes: No results for input(s): CKTOTAL, CKMB, CKMBINDEX, TROPONINI in the last 168 hours. BNP: BNP (last 3 results)  Recent Labs  11/23/14 0328 12/20/14 1158 03/14/15 1946  BNP 866.8* 480.7* 746.2*    ProBNP (last 3 results)  Recent Labs  09/20/14 1201 10/04/14 1256 12/12/14 1614  PROBNP 3006.00*  2524.00* 1892.00*    CBG:  Recent Labs Lab 03/22/15 0726 03/22/15 1156 03/22/15 1558 03/22/15 2144 03/23/15 0812  GLUCAP 149* 358* 78 261* 135*       Signed:  Joseph Art MD  FACP  [facility] 03/23/2015, 12:24 PM

## 2015-03-23 NOTE — Significant Event (Signed)
Patient refusing to be place on telemetry monitor since he is to be discharge today. RN educated patient on the importance to keep monitoring patient until it is time for him to go. Patient continue to refuse and demanding staff that he leaves now. Patient stated he will call taxi. When RN asked if he knows the number for Taxi, patient stated no. Patient also expected hospital to pay for his taxi fare. RN spoke with social worker to get patient transportation. Awaiting on that as well as internal medicine to see patient.

## 2015-03-24 ENCOUNTER — Telehealth: Payer: Self-pay

## 2015-03-24 NOTE — Telephone Encounter (Signed)
Transitional Care Clinic Post-discharge Follow-Up Phone Call:  Date of Discharge: 03/23/15 Principal Discharge Diagnosis(es): Atrial fibrillation with RVR, acute on chronic systolic congestive heart failure  Call Completed: Yes                    With Whom: Patient    Please check all that apply:  X  Patient is knowledgeable of his/her condition(s) and/or treatment. X  Patient is caring for self at home.  ? Patient is receiving assist at home from family and/or caregiver. Family and/or caregiver is knowledgeable of patient's condition(s) and/or treatment. ? Patient is receiving home health services. If so, name of agency.     Medication Reconciliation:  ? Medication list reviewed with patient. Attempted to review medication list; however, patient indicated he was not at home and did not have his discharge instructions in front of him.  X  Patient obtained all discharge medications-No. Inquired if patient obtained his discharge medications, but patient indicated he had not yet picked up his medications. Stressed importance of picking up discharge medications today, and informed patient of [facility] pharmacy hours today-03/24/15.  Also inquired if patient would be agreeable to [facility] for Patient Disease/Condition Management Education, Medication Management, and patient verbalized understanding.  Dr. Venetia Night agreeable. Spoke with Lucio Edward, [facility] Case Manager, who indicated referral needed as well as updated medication list.  Referral form and medication list faxed to [facility] (Fax #[phone]).   Activities of Daily Living:  X  Independent ? Needs assist  ? [facility]    Community resources in place for patient:  X  Referral sent to [facility] for Patient Disease/ Condition Management Education, Medication Management ? Home Health/Home DME ? Assisted Living ?  Support Group           Questions/Concerns discussed: Inquired about patient's status. Patient denied any health concerns and indicated he was "breathing good now." He denied shortness of breath and swelling in lower extremities, abdomen, or hands. Patient indicated he had not yet picked up his discharge medications. Stressed importance of picking up his medications today, and patient informed of [facility] and [facility] pharmacy hours on 03/24/15. Patient verbalized understanding and agreeable to [facility] referral.  Referral faxed to Lucio Edward with [facility]. Patient reminded of his Transitional Care Clinic appointment on 03/28/15 at 1400.

## 2015-03-28 ENCOUNTER — Telehealth: Payer: Self-pay

## 2015-03-28 ENCOUNTER — Ambulatory Visit: Payer: Medicare Other | Attending: Family Medicine | Admitting: Family Medicine

## 2015-03-28 ENCOUNTER — Encounter: Payer: Self-pay | Admitting: Family Medicine

## 2015-03-28 VITALS — BP 133/84 | HR 100 | Temp 97.6°F | Resp 18 | Ht 66.0 in | Wt 213.0 lb

## 2015-03-28 DIAGNOSIS — R570 Cardiogenic shock: Secondary | ICD-10-CM | POA: Diagnosis not present

## 2015-03-28 DIAGNOSIS — I2510 Atherosclerotic heart disease of native coronary artery without angina pectoris: Secondary | ICD-10-CM | POA: Insufficient documentation

## 2015-03-28 DIAGNOSIS — I482 Chronic atrial fibrillation, unspecified: Secondary | ICD-10-CM

## 2015-03-28 DIAGNOSIS — I739 Peripheral vascular disease, unspecified: Secondary | ICD-10-CM

## 2015-03-28 DIAGNOSIS — Z79899 Other long term (current) drug therapy: Secondary | ICD-10-CM | POA: Insufficient documentation

## 2015-03-28 DIAGNOSIS — F17200 Nicotine dependence, unspecified, uncomplicated: Secondary | ICD-10-CM | POA: Diagnosis not present

## 2015-03-28 DIAGNOSIS — E1151 Type 2 diabetes mellitus with diabetic peripheral angiopathy without gangrene: Secondary | ICD-10-CM | POA: Diagnosis not present

## 2015-03-28 DIAGNOSIS — Z7901 Long term (current) use of anticoagulants: Secondary | ICD-10-CM | POA: Diagnosis not present

## 2015-03-28 DIAGNOSIS — R0602 Shortness of breath: Secondary | ICD-10-CM | POA: Insufficient documentation

## 2015-03-28 DIAGNOSIS — Z951 Presence of aortocoronary bypass graft: Secondary | ICD-10-CM | POA: Diagnosis not present

## 2015-03-28 DIAGNOSIS — Z95810 Presence of automatic (implantable) cardiac defibrillator: Secondary | ICD-10-CM

## 2015-03-28 DIAGNOSIS — I10 Essential (primary) hypertension: Secondary | ICD-10-CM | POA: Diagnosis not present

## 2015-03-28 DIAGNOSIS — Z9119 Patient's noncompliance with other medical treatment and regimen: Secondary | ICD-10-CM | POA: Insufficient documentation

## 2015-03-28 DIAGNOSIS — I5042 Chronic combined systolic (congestive) and diastolic (congestive) heart failure: Secondary | ICD-10-CM | POA: Insufficient documentation

## 2015-03-28 LAB — GLUCOSE, POCT (MANUAL RESULT ENTRY): POC GLUCOSE: 214 mg/dL — AB (ref 70–99)

## 2015-03-28 NOTE — Progress Notes (Signed)
Patient's here for [facility] hosrital f/up for CHF.  Patient's reports feeling okay.  Patient needs refill on medication.

## 2015-03-28 NOTE — Telephone Encounter (Signed)
At the request of Dr Venetia Night, call placed to Jacob [name] with [facility] # [phone] to confirm that the referral has been received and inquire when a home visit will be scheduled as the patient is in need of assistance with managing his medications. Voice mail message left requesting a call back to # [phone] or [phone].

## 2015-03-28 NOTE — Progress Notes (Signed)
TRANSITIONAL CARE CLINIC  Date of telephone encounter: 03/24/15  Admit date: 03/14/15 Discharge date: 03/23/15  PCP: Dr Hyman Hopes  Subjective:  Patient ID: Jacob [name], male    DOB: 14-Feb-1956  Age: 60 y.o. MRN: [id number]  CC: Follow up at the transitional care clinic  HPI Jacob [name] is a 60 year old male with a history of type 2 diabetes mellitus (A1c 8.8), hypertension, atrial fibrillation (failed DCCV in 11/2014), congestive heart failure (EF 30-35%),s/p ICD, CAD s/p CABG with MAZE, emphysema, peripheral vascular disease, anemia secondary to bleeding duodenal ulcer who comes in today for follow-up of the transitional Clinic after hospitalization for cardiogenic shock secondary to decompensated acute systolic heart failure.  He had presented to [facility] ED with worsening shortness of breath. His chest x-ray revealed a questionable right basilar infiltrate for which he was placed on Rocephin and Doxycycline He was admitted to telemetry , closely followed by the heart failure team,  Later transferred to stepdown unit and placed on IV  Lasix, IV milrinone,  IV amiodarone. Metoprolol was discontinued due to cardiogenic shock;  He diuresed well and volume status improved. He did have cardiorenal syndrome  secondary to cardiogenic shock with elevation in creatinine which subsequently trended down  to 1.27 on discharge. Milrinone was subsequently  discontinued as he was deemed not to be a good candidate for home milrinone due to poor social support.  glipizide was increased to 10 mg twice daily due to elevated A1c of 8.8; discharge weight was 205.   Interval history: He reports doing well and does not have any of his medications with him and is yet to pick up the new prescriptions which he received a discharge.  states he feels good. He has not been checking his weight because he lost the scale that we gave him.  He was scheduled to see vascular surgery for peripheral vascular disease but he no  showed his appointment and he also failed to make his appointment with [facility] GI for evaluation of anemia secondary to bleeding duodenal ulcers.  Outpatient Prescriptions Prior to Visit  Medication Sig Dispense Refill  . albuterol (PROVENTIL HFA;VENTOLIN HFA) 108 (90 BASE) MCG/ACT inhaler Inhale 2 puffs into the lungs every 6 (six) hours as needed for wheezing or shortness of breath. 1 Inhaler 2  . albuterol (PROVENTIL) (2.5 MG/3ML) 0.083% nebulizer solution Take 3 mLs (2.5 mg total) by nebulization every 6 (six) hours as needed for wheezing or shortness of breath. 150 mL 1  . amiodarone (PACERONE) 200 MG tablet Take 1 tablet (200 mg total) by mouth 2 (two) times daily. 60 tablet 0  . atorvastatin (LIPITOR) 40 MG tablet Take 1 tablet (40 mg total) by mouth daily at 6 PM. 30 tablet 2  . carvedilol (COREG) 3.125 MG tablet Take 1 tablet (3.125 mg total) by mouth 2 (two) times daily with a meal. 60 tablet 0  . digoxin 62.5 MCG TABS Take 0.0625 mg by mouth every other day. 15 tablet 0  . docusate sodium (COLACE) 100 MG capsule Take 1 capsule (100 mg total) by mouth daily. 30 capsule 2  . ferrous sulfate 325 (65 FE) MG tablet Take 1 tablet (325 mg total) by mouth 3 (three) times daily with meals. (Patient taking differently: Take 650 mg by mouth daily with breakfast. ) 90 tablet 2  . furosemide (LASIX) 80 MG tablet Take 1 tablet (80 mg total) by mouth 2 (two) times daily. 60 tablet 0  . glipiZIDE (GLUCOTROL) 10 MG tablet Take 1 tablet (  10 mg total) by mouth 2 (two) times daily before a meal. 60 tablet 0  . isosorbide-hydrALAZINE (BIDIL) 20-37.5 MG tablet Take 1 tablet by mouth 3 (three) times daily. 90 tablet 0  . mometasone-formoterol (DULERA) 100-5 MCG/ACT AERO Inhale 2 puffs into the lungs 2 (two) times daily. 1 Inhaler 1  . pantoprazole (PROTONIX) 40 MG tablet Take 1 tablet (40 mg total) by mouth 2 (two) times daily. 60 tablet 2  . rivaroxaban (XARELTO) 20 MG TABS tablet Take 1 tablet (20 mg total)  by mouth daily before supper. 30 tablet 2  . spironolactone (ALDACTONE) 25 MG tablet Take 1 tablet (25 mg total) by mouth daily. 30 tablet 0  . traMADol (ULTRAM) 50 MG tablet Take 1 tablet (50 mg total) by mouth every 8 (eight) hours as needed for moderate pain. 60 tablet 1   No facility-administered medications prior to visit.    ROS Review of Systems  Constitutional: Negative for activity change and appetite change.  HENT: Negative for sinus pressure and sore throat.   Eyes: Negative for visual disturbance.  Respiratory: Negative for cough, chest tightness and shortness of breath.   Cardiovascular: Negative for chest pain and leg swelling.       Positive for intermittent claudication  Gastrointestinal: Negative for abdominal pain, diarrhea, constipation and abdominal distention.  Endocrine: Negative.   Genitourinary: Negative for dysuria.  Musculoskeletal: Negative for myalgias and joint swelling.  Skin: Negative for rash.  Allergic/Immunologic: Negative.   Neurological: Negative for weakness, light-headedness and numbness.  Psychiatric/Behavioral: Negative for suicidal ideas and dysphoric mood.    Objective:  BP 133/84 mmHg  Pulse 100  Temp(Src) 97.6 F (36.4 C) (Oral)  Resp 18  Ht  (1.676 m)  Wt 213 lb (96.616 kg)  BMI 34.40 kg/m2  SpO2 98%  BP/Weight 03/28/2015 03/23/2015 02/27/2015  Systolic BP 133 98 146  Diastolic BP 84 71 88  Wt. (Lbs) 213 205.2 236  BMI 34.4 34.15 38.11   Wt Readings from Last 3 Encounters:  03/28/15 213 lb (96.616 kg)  03/23/15 205 lb 3.2 oz (93.078 kg)  02/27/15 236 lb (107.049 kg)     Lab Results  Component Value Date   WBC 8.1 03/21/2015   HGB 11.0* 03/21/2015   HCT 39.1 03/21/2015   PLT 291 03/21/2015   GLUCOSE 214* 03/23/2015   CHOL 80 08/05/2014   TRIG 51 08/05/2014   HDL 18* 08/05/2014   LDLCALC 52 08/05/2014   ALT 15* 03/22/2015   AST 25 03/22/2015   NA 129* 03/23/2015   K 4.2 03/23/2015   CL 92* 03/23/2015    CREATININE 1.27* 03/23/2015   BUN 51* 03/23/2015   CO2 30 03/23/2015   TSH 4.690* 03/18/2015   INR 1.59* 03/14/2015   HGBA1C 8.8* 03/15/2015    Physical Exam  Constitutional: He is oriented to person, place, and time. He appears well-developed and well-nourished.  HENT:  Head: Normocephalic and atraumatic.  Right Ear: External ear normal.  Left Ear: External ear normal.  Eyes: Conjunctivae and EOM are normal. Pupils are equal, round, and reactive to light.  Neck: Normal range of motion. Neck supple. No tracheal deviation present.  Cardiovascular: Normal rate, regular rhythm and normal heart sounds.   No murmur heard. Pulmonary/Chest: Effort normal and breath sounds normal. No respiratory distress. He has no wheezes. He exhibits no tenderness.  Abdominal: Soft. Bowel sounds are normal. He exhibits no mass. There is no tenderness.  Musculoskeletal: Normal range of motion. He exhibits no  edema or tenderness.  Neurological: He is alert and oriented to person, place, and time.  Skin: Skin is warm and dry.  Psychiatric: He has a normal mood and affect.     Assessment & Plan:   1. DM (diabetes mellitus), type 2 with peripheral vascular complications (HCC) Uncontrolled with A1c of 8.8 which is up from 3 months ago which was 6.9 He is yet to pick up the increased dose of glipizide of 10 mg twice daily which I have advised him to do Advised to adhere to an ADA diet. - Glucose (CBG)  2. Essential hypertension Controlled. Continue antihypertensives  3. ICD - in place- BS May 2014 [place] Stable  4. Coronary artery disease involving native coronary artery of native heart without angina pectoris Stable  5. Chronic combined systolic and diastolic heart failure (HCC) NYHA class IV, EF of 30-35% from 2-D echo of 11/2014 No evidence of acute exacerbation of this time. His weight is up 8 pounds from discharge 5 days ago (weight was checked without his coat and shoes) He is yet to pick  up his new doses of medication which I have advised him to do. He states he is working on a low-sodium diet but has been unable to check his weight because he lost the scale which we got for him. I will get in touch with the case managers so we can can arrange this for him   6. PVD (peripheral vascular disease) (HCC) He does have some intermittent claudication pain for which he was referred to vascular surgery and he missed his appointments  7. Chronic atrial fibrillation (HCC) Currently in sinus rhythm. Continue amiodarone, Xarelto  8. Tobacco use disorder Spent 3 minutes counseling the patient on cessation and he is not ready to quit at this time.  9. Cardiogenic shock (HCC) Resolved  10. Noncompliance with therapy We will try to get him back with [facility] or arrange home nursing care for him as he has no idea of what medications he should be taking or the dose changes and has a high risk for readmission. He also needs frequent reminding of his appointments and so we will have to call him on the morning of his cardiology appointment to ensure that he does not miss this. He has been advised to pickup his new prescriptions which he received at discharge from the pharmacy today and to come in to see the clinical pharmacist tomorrow for reconcilliation of all his medications.   No orders of the defined types were placed in this encounter.     This note has been created with Education officer, environmental. Any transcriptional errors are unintentional.     Follow-up: Return in about 1 week (around 04/04/2015) for TCC -follow-up of CHF and diabetes mellitus; place on Stacey's PM schedule.   Jaclyn Shaggy MD

## 2015-03-28 NOTE — Telephone Encounter (Signed)
CMA called patient, patient verified name and DOB. Patient was requesting a refill of syringes for his insulin. According to the patient, he's currently on insulin. Patient's current medication, and   history medication, doesn't reflect that. Per Dr. Venetia Night, patient is to bring in current medication along with his insulin to see Kennyth Arnold, the Pharmacist. Patient was transferred to the front for that appt.Patient verbalized he understood with no further questions.

## 2015-03-28 NOTE — Patient Instructions (Signed)

## 2015-03-29 ENCOUNTER — Encounter: Payer: Medicare Other | Admitting: Pharmacist

## 2015-03-29 ENCOUNTER — Ambulatory Visit: Payer: Medicare Other | Attending: Internal Medicine | Admitting: Pharmacist

## 2015-03-29 ENCOUNTER — Telehealth: Payer: Self-pay

## 2015-03-29 DIAGNOSIS — Z5181 Encounter for therapeutic drug level monitoring: Secondary | ICD-10-CM | POA: Insufficient documentation

## 2015-03-29 DIAGNOSIS — R0600 Dyspnea, unspecified: Secondary | ICD-10-CM

## 2015-03-29 DIAGNOSIS — Z79899 Other long term (current) drug therapy: Secondary | ICD-10-CM | POA: Insufficient documentation

## 2015-03-29 MED ORDER — PANTOPRAZOLE SODIUM 40 MG PO TBEC: 40.0000 mg | DELAYED_RELEASE_TABLET | Freq: Two times a day (BID) | ORAL | Status: DC

## 2015-03-29 MED ORDER — RIVAROXABAN 20 MG PO TABS: 20.0000 mg | ORAL_TABLET | Freq: Every day | ORAL | Status: DC

## 2015-03-29 MED ORDER — ATORVASTATIN CALCIUM 40 MG PO TABS: 40.0000 mg | ORAL_TABLET | Freq: Every day | ORAL | Status: DC

## 2015-03-29 MED ORDER — ALBUTEROL SULFATE HFA 108 (90 BASE) MCG/ACT IN AERS: 2.0000 | INHALATION_SPRAY | Freq: Four times a day (QID) | RESPIRATORY_TRACT | Status: DC | PRN

## 2015-03-29 MED ORDER — FERROUS SULFATE 325 (65 FE) MG PO TABS: 325.0000 mg | ORAL_TABLET | Freq: Three times a day (TID) | ORAL | Status: DC

## 2015-03-29 MED ORDER — DOCUSATE SODIUM 100 MG PO CAPS: 100.0000 mg | ORAL_CAPSULE | Freq: Every day | ORAL | Status: DC

## 2015-03-29 NOTE — Patient Instructions (Signed)
Thanks for coming to see me today!  Do not take the Lantus any more. We will restart it if you need it  Go to your cardiology appointment tomorrow at 10:20 AM!

## 2015-03-29 NOTE — Progress Notes (Signed)
S:    Patient arrives in good spirits.  Presents for medication management.  Patient reports adherence with medications but isn't sure of all that he is supposed to be on. He also doesn't know what all of the medications are for but knows that glipizide is for diabetes.   He brings with him a bag of the medications that he picked up today but he did not bring any of his medications from home. He reports that he does have Xarelto and tramadol at home but none of the other medications on his list.   He reports that he takes Lantus whenever he feels like his blood glucose is too high. He reports that he will take about 3 units and that he needs more syringes to be able to do this.   O:    A/P: Medication Adherence: Patient has known adherence challenges (not taking all of the medications on his list). Barriers include: lack of knowledge. I reviewed each medication with him and what the indication was for. I also provided patient with a list of his medications with the indication as well as put a heart sticker on all of his cardiac medications so that he would know they were for his heart. Patient was very surprised to learn that so many of his medications were for his heart and will make sure to take all of these medications.   Medication Reconciliation: medication list reviewed and updated. The following issues were noted: patient reports taking Lantus but it is not a current medication. Patient denied taking: albuterol, atorvastatin, docusate, ferrous sulfate, Dulera, and pantoprazole. I refilled all of these medications, reviewed indication, and instructed patient to pick them up. Patient was provided with a printed medication list with indications as described above.  Stressed the importance of going to his cardiology appointment tomorrow. Highlighted appointment and provided instructions for getting to appointment. Patient verbalized understanding.    Written patient instructions provided.   Total time in face to face counseling 35 minutes.  Follow up in Pharmacist Clinic Visit as needed.

## 2015-03-29 NOTE — Telephone Encounter (Signed)
Patient called to determine if he picked up his medications from pharmacy after appointment on 03/28/15. Patient indicated he picked up his medications. Reminded patient of his appointment for medication management with Juanita Craver, Pharmacist at 1030. Patient asked if appointment could be moved to afternoon. Appointment changed to 1430 today with Juanita Craver.  Patient appreciative of call.   This Case Manager placed call to Lucio Edward with [facility] [phone]) to determine when a home visit for medication management and Disease/Condition Management is scheduled for patient.  Unable to reach; left voicemail requesting return call

## 2015-03-29 NOTE — Telephone Encounter (Signed)
This Case Manager received return phone call from Jacob [name] with [facility]. She indicated patient's case had been assigned and Case Manager would reach out to patient to determine when home visit could be arranged.

## 2015-03-30 ENCOUNTER — Telehealth: Payer: Self-pay

## 2015-03-30 ENCOUNTER — Ambulatory Visit (HOSPITAL_COMMUNITY)
Admit: 2015-03-30 | Discharge: 2015-03-30 | Disposition: A | Discharge status: Home / Self Care | Payer: Medicare Other | Source: Ambulatory Visit | Attending: Adult Health | Admitting: Adult Health

## 2015-03-30 ENCOUNTER — Telehealth: Payer: Self-pay | Admitting: *Deleted

## 2015-03-30 VITALS — BP 96/64 | HR 113 | Wt 217.0 lb

## 2015-03-30 DIAGNOSIS — F17210 Nicotine dependence, cigarettes, uncomplicated: Secondary | ICD-10-CM | POA: Insufficient documentation

## 2015-03-30 DIAGNOSIS — Z833 Family history of diabetes mellitus: Secondary | ICD-10-CM | POA: Insufficient documentation

## 2015-03-30 DIAGNOSIS — Z79899 Other long term (current) drug therapy: Secondary | ICD-10-CM | POA: Insufficient documentation

## 2015-03-30 DIAGNOSIS — I252 Old myocardial infarction: Secondary | ICD-10-CM | POA: Insufficient documentation

## 2015-03-30 DIAGNOSIS — Z720 Tobacco use: Secondary | ICD-10-CM | POA: Diagnosis not present

## 2015-03-30 DIAGNOSIS — Z7984 Long term (current) use of oral hypoglycemic drugs: Secondary | ICD-10-CM | POA: Insufficient documentation

## 2015-03-30 DIAGNOSIS — I482 Chronic atrial fibrillation: Secondary | ICD-10-CM | POA: Diagnosis not present

## 2015-03-30 DIAGNOSIS — Z95810 Presence of automatic (implantable) cardiac defibrillator: Secondary | ICD-10-CM | POA: Diagnosis not present

## 2015-03-30 DIAGNOSIS — I5023 Acute on chronic systolic (congestive) heart failure: Secondary | ICD-10-CM

## 2015-03-30 DIAGNOSIS — Z7902 Long term (current) use of antithrombotics/antiplatelets: Secondary | ICD-10-CM | POA: Diagnosis not present

## 2015-03-30 DIAGNOSIS — M79661 Pain in right lower leg: Secondary | ICD-10-CM

## 2015-03-30 DIAGNOSIS — M79604 Pain in right leg: Secondary | ICD-10-CM | POA: Diagnosis not present

## 2015-03-30 DIAGNOSIS — I110 Hypertensive heart disease with heart failure: Secondary | ICD-10-CM | POA: Insufficient documentation

## 2015-03-30 DIAGNOSIS — I2510 Atherosclerotic heart disease of native coronary artery without angina pectoris: Secondary | ICD-10-CM | POA: Diagnosis not present

## 2015-03-30 DIAGNOSIS — Z7901 Long term (current) use of anticoagulants: Secondary | ICD-10-CM | POA: Insufficient documentation

## 2015-03-30 DIAGNOSIS — I739 Peripheral vascular disease, unspecified: Secondary | ICD-10-CM | POA: Diagnosis not present

## 2015-03-30 DIAGNOSIS — Z951 Presence of aortocoronary bypass graft: Secondary | ICD-10-CM | POA: Insufficient documentation

## 2015-03-30 DIAGNOSIS — I5022 Chronic systolic (congestive) heart failure: Secondary | ICD-10-CM | POA: Insufficient documentation

## 2015-03-30 DIAGNOSIS — I4891 Unspecified atrial fibrillation: Secondary | ICD-10-CM

## 2015-03-30 DIAGNOSIS — E119 Type 2 diabetes mellitus without complications: Secondary | ICD-10-CM | POA: Diagnosis not present

## 2015-03-30 DIAGNOSIS — E7800 Pure hypercholesterolemia, unspecified: Secondary | ICD-10-CM | POA: Insufficient documentation

## 2015-03-30 LAB — BASIC METABOLIC PANEL
ANION GAP: 9 (ref 5–15)
BUN: 22 mg/dL — ABNORMAL HIGH (ref 6–20)
CALCIUM: 8.8 mg/dL — AB (ref 8.9–10.3)
CO2: 28 mmol/L (ref 22–32)
Chloride: 98 mmol/L — ABNORMAL LOW (ref 101–111)
Creatinine, Ser: 0.98 mg/dL (ref 0.61–1.24)
GFR calc Af Amer: >60 mL/min (ref >60)
GLUCOSE: 267 mg/dL — AB (ref 65–99)
POTASSIUM: 4.4 mmol/L (ref 3.5–5.1)
SODIUM: 135 mmol/L (ref 135–145)

## 2015-03-30 LAB — BRAIN NATRIURETIC PEPTIDE: B NATRIURETIC PEPTIDE 5: 777.9 pg/mL — AB (ref 0.0–100.0)

## 2015-03-30 NOTE — Progress Notes (Signed)
Advanced Heart Failure Medication Review by a Pharmacist  Does the patient  feel that his/her medications are working for him/her?  yes  Has the patient been experiencing any side effects to the medications prescribed?  no  Does the patient measure his/her own blood pressure or blood glucose at home?  no   Does the patient have any problems obtaining medications due to transportation or finances?   no  Understanding of regimen: fair Understanding of indications: fair Potential of compliance: fair Patient understands to avoid NSAIDs. Patient understands to avoid decongestants.  Issues to address at subsequent visits: Compliance    Pharmacist comments:  Jacob [name] is a pleasant 60 yo M presenting without his medication list but had an extensive medication therapy management visit with pharmacist Jacob [name] yesterday. He reports good compliance with his medications but states that he has only been taking his Bidil twice daily and did not take his morning doses of medications this morning. I reinforced the importance of consistent use of his medications.  Jacob [name]. Jacob [name], PharmD, BCPS, CPP Clinical Pharmacist Pager: [phone] Phone: [phone] 03/30/2015 10:43 AM      Time with patient: 8 minutes Preparation and documentation time: 2 minutes Total time: 10 minutes

## 2015-03-30 NOTE — Patient Instructions (Addendum)
You have been referred to [facility] with Dr.Berry for ABI's and follow up on R calf pain  Your physician has requested that you have an ankle brachial index (ABI). During this test an ultrasound and blood pressure cuff are used to evaluate the arteries that supply the arms and legs with blood. Allow thirty minutes for this exam. There are no restrictions or special instructions.  Labs today  You have been referred to [facility], Florentina Addison will be in contact with you to arrange a home visit.  Your physician recommends that you schedule a follow-up appointment in: 4 weeks  Do the following things EVERYDAY: 1) Weigh yourself in the morning before breakfast. Write it down and keep it in a log. 2) Take your medicines as prescribed 3) Eat low salt foods-Limit salt (sodium) to 2000 mg per day.  4) Stay as active as you can everyday 5) Limit all fluids for the day to less than 2 liters 6)

## 2015-03-30 NOTE — Telephone Encounter (Signed)
Spoke to patient to verify he was going to his cardiology appointment today.  He stated he was on his way.

## 2015-03-30 NOTE — Progress Notes (Signed)
Patient ID: Jacob [name], male   DOB: 17-Oct-1955, 60 y.o.   MRN: [id number] PCP: None  Primary HF Cardiologist: Dr Gala Romney   HPI: Jacob [name] is a 60 year old [place] male with history of systolic HF EF 20-25% via Echo 08/03/14 s/p INCEPTA ICD implant 6/14, RV dysfunction, CAD s/p CABG x 2 w/ RF MAZE 7/14 at [facility], DM type II, Chronic afib on xarelto, GI bleed 11/28/2014, and HLD.   Admitted the end of August 2016  with increased dyspnea on exertion. Later found to be in cardiogenic shock . At one point on dual pressors milrinone and norepi. Diuresed with IV lasix and transitioned to po lasix. Hospital course complicated by GI bleed and A fib RVR. Loaded on amio but later placed on toprol xl for rate control. On 9/5 had EGD with duodenal bleed with clip applied. He was discharged 9/12 with D/C weight 203 pounds.   Admitted late December 2016 with increased dyspnea. Diuresed with IV lasix and transitioned to po lasix 80 mg twice a day. Chronic A fib and continued on amio 200 mg twice a day. Discharge weight was 205 pounds.   He returns for post hospital follow up.  Overall feeling ok. Complainig of RLE pain. Difficulty walking due to RLE calf pain. Mild dyspnea with exertion. Denies PND/Orthopnea. No BRBPR. Weight at home 213 pounds. Smoking 1/2 PPD. Not drinking alcohol. Has all medications but did not take them today.   SH: Lives in low income housing. Estranged from family   ROS: All systems negative except as listed in HPI, PMH and Problem List.  SH:  Social History   Social History  . Marital Status: Divorced    Spouse Name: N/A  . Number of Children: 3  . Years of Education: N/A   Occupational History  . Unemployed    Social History Main Topics  . Smoking status: Current Every Day Smoker -- 1.50 packs/day for 40 years    Types: Cigarettes  . Smokeless tobacco: Never Used  . Alcohol Use: No  . Drug Use: No  . Sexual Activity: No   Other Topics Concern  . Not on file   Social  History Narrative   Has an apartment with a roommate. He was living on the streets in 01/27/2013.  He reports that his father died in [place] in 2013/01/27.  He is divorced.  He is no longer estranged from his son, but still from his daughter who lives locally.  Neither of his parents, nor any siblings have any history of CAD.    FH:  Family History  Problem Relation Age of Onset  . Diabetes Mother     Past Medical History  Diagnosis Date  . Hypertension   . Noncompliance     homelessness contributing.   Marland Kitchen CAD (coronary artery disease) Sept 2013    s/p cardiac cath showing occlusion of small RCA with collaterals  . Chronic anticoagulation     on xarelto.   . Atrial fibrillation (HCC)     RVR 10/2014  . Peripheral arterial disease (HCC)   . Automatic implantable cardioverter-defibrillator in situ   . High cholesterol   . Myocardial infarction (HCC) 2014  . CHF (congestive heart failure) (HCC)     20 to 25 % EF and RV dysfunction by 07/2014 echo   . Type II diabetes mellitus (HCC)     Current Outpatient Prescriptions  Medication Sig Dispense Refill  . amiodarone (PACERONE) 200 MG tablet Take 1  tablet (200 mg total) by mouth 2 (two) times daily. 60 tablet 0  . atorvastatin (LIPITOR) 40 MG tablet Take 1 tablet (40 mg total) by mouth daily at 6 PM. 30 tablet 2  . carvedilol (COREG) 3.125 MG tablet Take 1 tablet (3.125 mg total) by mouth 2 (two) times daily with a meal. 60 tablet 0  . digoxin 62.5 MCG TABS Take 0.0625 mg by mouth every other day. 15 tablet 0  . ferrous sulfate 325 (65 FE) MG tablet Take 325 mg by mouth daily with breakfast.    . furosemide (LASIX) 80 MG tablet Take 1 tablet (80 mg total) by mouth 2 (two) times daily. 60 tablet 0  . glipiZIDE (GLUCOTROL) 10 MG tablet Take 1 tablet (10 mg total) by mouth 2 (two) times daily before a meal. 60 tablet 0  . isosorbide-hydrALAZINE (BIDIL) 20-37.5 MG tablet Take 1 tablet by mouth 2 (two) times daily.    .  mometasone-formoterol (DULERA) 100-5 MCG/ACT AERO Inhale 2 puffs into the lungs 2 (two) times daily. 1 Inhaler 1  . pantoprazole (PROTONIX) 40 MG tablet Take 1 tablet (40 mg total) by mouth 2 (two) times daily. 60 tablet 2  . rivaroxaban (XARELTO) 20 MG TABS tablet Take 1 tablet (20 mg total) by mouth daily before supper. 30 tablet 2  . spironolactone (ALDACTONE) 25 MG tablet Take 1 tablet (25 mg total) by mouth daily. 30 tablet 0  . traMADol (ULTRAM) 50 MG tablet Take 1 tablet (50 mg total) by mouth every 8 (eight) hours as needed for moderate pain. 60 tablet 1  . albuterol (PROVENTIL HFA;VENTOLIN HFA) 108 (90 Base) MCG/ACT inhaler Inhale 2 puffs into the lungs every 6 (six) hours as needed for wheezing or shortness of breath. (Patient not taking: Reported on 03/29/2015) 1 Inhaler 2  . albuterol (PROVENTIL) (2.5 MG/3ML) 0.083% nebulizer solution Take 3 mLs (2.5 mg total) by nebulization every 6 (six) hours as needed for wheezing or shortness of breath. (Patient not taking: Reported on 03/29/2015) 150 mL 1  . docusate sodium (COLACE) 100 MG capsule Take 1 capsule (100 mg total) by mouth daily. (Patient not taking: Reported on 03/29/2015) 30 capsule 2   No current facility-administered medications for this encounter.    Filed Vitals:   03/30/15 1028  BP: 96/64  Pulse: 113  Weight: 217 lb (98.431 kg)  SpO2: 97%    PHYSICAL EXAM:  General:  Chronically ill appearing. No resp difficulty. Arrived in a wheelchair HEENT: normal Neck: supple. JVP 7-8 . Carotids 2+ bilaterally; no bruits. No lymphadenopathy or thryomegaly appreciated. Cor: PMI normal. Irregular  rate & rhythm. No rubs, gallops or murmurs. Lungs: clear Abdomen: soft, nontender, nondistended. No hepatosplenomegaly. No bruits or masses. Good bowel sounds. Extremities: no cyanosis, clubbing, rash, R and LLE no edema.  Neuro: alert & orientedx3, cranial nerves grossly intact. Moves all 4 extremities w/o difficulty. Affect  pleasant.    ASSESSMENT & PLAN: 1. Chronic Systolic Heart Failure - ICM EF 20-25%. Has [employer] ICD.  Recently admitted with cardiogenic shock and volume overload. Today I am not going to make medicaiton changes as he did not take his medications this morning and his BP is soft.  NYHA III. Volume stable . Continue lasix 80 mg twice a day. Continue Toprol XL, spiro, and dig. No ace for now with soft SBP.  He is not a candidate for advanced therapies. BMET and BNP today.  2. Chronic A fib -  Rate controlled. Continue Toprol XL +  Xarelto. No bleeding problems.  3. DMII- Per PCP  4. H/O GI Bleed - duodenal ulcer with clip 9/5 5. PAD- Last ABI 2015 . R EIA stent with known bilateral SFA occlusion. Now with ongoing RLE pain in calf- I will refer him back to Dr Allyson Sabal for ABI.  5. Homelessness-- now has low income housing.  6. Immunizations - He has had flu vaccine 2016 and pneumococcal vaccine 2015 .  7. Smoking- Current smoker 1/2 PPD. Counseled on smoking cessation.   Refer to paramedicine. He is at risk for readmit due to social situation and poor insight.   Amy Clegg NP-C  9:28 AM

## 2015-04-03 ENCOUNTER — Telehealth: Payer: Self-pay
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
## 2015-04-04 ENCOUNTER — Encounter: Payer: Self-pay | Admitting: *Deleted

## 2015-04-04 ENCOUNTER — Other Ambulatory Visit (HOSPITAL_COMMUNITY): Payer: Self-pay

## 2015-04-04 ENCOUNTER — Encounter: Payer: Self-pay | Admitting: Family Medicine

## 2015-04-04 ENCOUNTER — Telehealth: Payer: Self-pay

## 2015-04-04 ENCOUNTER — Ambulatory Visit: Payer: Medicare Other | Attending: Family Medicine | Admitting: Family Medicine

## 2015-04-04 VITALS — BP 116/61 | HR 75 | Temp 97.7°F | Resp 13 | Ht 66.0 in | Wt 218.8 lb

## 2015-04-04 DIAGNOSIS — I5042 Chronic combined systolic (congestive) and diastolic (congestive) heart failure: Secondary | ICD-10-CM | POA: Diagnosis not present

## 2015-04-04 DIAGNOSIS — I25811 Atherosclerosis of native coronary artery of transplanted heart without angina pectoris: Secondary | ICD-10-CM | POA: Diagnosis not present

## 2015-04-04 DIAGNOSIS — E7800 Pure hypercholesterolemia, unspecified: Secondary | ICD-10-CM | POA: Insufficient documentation

## 2015-04-04 DIAGNOSIS — D509 Iron deficiency anemia, unspecified: Secondary | ICD-10-CM

## 2015-04-04 DIAGNOSIS — Z7901 Long term (current) use of anticoagulants: Secondary | ICD-10-CM | POA: Diagnosis not present

## 2015-04-04 DIAGNOSIS — R570 Cardiogenic shock: Secondary | ICD-10-CM | POA: Insufficient documentation

## 2015-04-04 DIAGNOSIS — I10 Essential (primary) hypertension: Secondary | ICD-10-CM | POA: Diagnosis not present

## 2015-04-04 DIAGNOSIS — I482 Chronic atrial fibrillation, unspecified: Secondary | ICD-10-CM

## 2015-04-04 DIAGNOSIS — I2510 Atherosclerotic heart disease of native coronary artery without angina pectoris: Secondary | ICD-10-CM | POA: Diagnosis not present

## 2015-04-04 DIAGNOSIS — I739 Peripheral vascular disease, unspecified: Secondary | ICD-10-CM | POA: Insufficient documentation

## 2015-04-04 DIAGNOSIS — Z951 Presence of aortocoronary bypass graft: Secondary | ICD-10-CM | POA: Diagnosis not present

## 2015-04-04 DIAGNOSIS — Z9109 Other allergy status, other than to drugs and biological substances: Secondary | ICD-10-CM | POA: Diagnosis not present

## 2015-04-04 DIAGNOSIS — Z590 Homelessness: Secondary | ICD-10-CM | POA: Diagnosis not present

## 2015-04-04 DIAGNOSIS — Z720 Tobacco use: Secondary | ICD-10-CM | POA: Insufficient documentation

## 2015-04-04 DIAGNOSIS — Z9119 Patient's noncompliance with other medical treatment and regimen: Secondary | ICD-10-CM | POA: Insufficient documentation

## 2015-04-04 DIAGNOSIS — E1159 Type 2 diabetes mellitus with other circulatory complications: Secondary | ICD-10-CM | POA: Diagnosis not present

## 2015-04-04 DIAGNOSIS — Z95810 Presence of automatic (implantable) cardiac defibrillator: Secondary | ICD-10-CM | POA: Insufficient documentation

## 2015-04-04 DIAGNOSIS — E119 Type 2 diabetes mellitus without complications: Secondary | ICD-10-CM | POA: Diagnosis not present

## 2015-04-04 DIAGNOSIS — F17210 Nicotine dependence, cigarettes, uncomplicated: Secondary | ICD-10-CM | POA: Diagnosis not present

## 2015-04-04 DIAGNOSIS — I252 Old myocardial infarction: Secondary | ICD-10-CM | POA: Insufficient documentation

## 2015-04-04 LAB — POCT GLYCOSYLATED HEMOGLOBIN (HGB A1C): Hemoglobin A1C: 7.9

## 2015-04-04 LAB — GLUCOSE, POCT (MANUAL RESULT ENTRY): POC Glucose: 122 mg/dl — AB (ref 70–99)

## 2015-04-04 MED ORDER — DIGOXIN 62.5 MCG PO TABS: 0.0625 mg | ORAL_TABLET | ORAL | Status: DC

## 2015-04-04 MED ORDER — FERROUS SULFATE 325 (65 FE) MG PO TABS: 325.0000 mg | ORAL_TABLET | Freq: Every day | ORAL | Status: DC

## 2015-04-04 MED ORDER — MOMETASONE FURO-FORMOTEROL FUM 100-5 MCG/ACT IN AERO: 2.0000 | INHALATION_SPRAY | Freq: Two times a day (BID) | RESPIRATORY_TRACT | Status: DC

## 2015-04-04 MED ORDER — RIVAROXABAN 20 MG PO TABS: 20.0000 mg | ORAL_TABLET | Freq: Every day | ORAL | Status: DC

## 2015-04-04 MED ORDER — FUROSEMIDE 80 MG PO TABS: 80.0000 mg | ORAL_TABLET | Freq: Two times a day (BID) | ORAL | Status: DC

## 2015-04-04 MED ORDER — AMIODARONE HCL 200 MG PO TABS: 200.0000 mg | ORAL_TABLET | Freq: Two times a day (BID) | ORAL | Status: DC

## 2015-04-04 MED ORDER — GLIPIZIDE 10 MG PO TABS: 10.0000 mg | ORAL_TABLET | Freq: Two times a day (BID) | ORAL | Status: DC

## 2015-04-04 MED ORDER — ATORVASTATIN CALCIUM 40 MG PO TABS: 40.0000 mg | ORAL_TABLET | Freq: Every day | ORAL | Status: DC

## 2015-04-04 MED ORDER — CARVEDILOL 3.125 MG PO TABS: 3.1250 mg | ORAL_TABLET | Freq: Two times a day (BID) | ORAL | Status: DC

## 2015-04-04 MED ORDER — SPIRONOLACTONE 25 MG PO TABS: 25.0000 mg | ORAL_TABLET | Freq: Every day | ORAL | Status: DC
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
## 2015-04-04 NOTE — Progress Notes (Signed)
TRANSITIONAL CARE CLINIC  Date of telephone encounter: 03/24/15  Admit date: 03/14/15 Discharge date: 03/23/15  PCP: Dr Hyman Hopes  Subjective:    Patient ID: Jacob [name], male    DOB: 09-Apr-1955, 60 y.o.   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 He had a hospitalization last month for cardiogenic shock and is doing well at this time and has also been to the heart failure clinic with no current change in regimen.  He reports doing well and has been compliant with his medications which he has with him today and has a better understanding of them. He denies shortness of breath, chest pain, pedal edema. He has also not smoke for the last 4 days due to the fact that he was indoors due to the inclement with and was also out of food as well; he states he is working on quitting completely. He has not been checking his daily weights because he does not have a scale.  Past Medical History  Diagnosis Date  . Hypertension   . Noncompliance     homelessness contributing.   Marland Kitchen CAD (coronary artery disease) Sept 2013    s/p cardiac cath showing occlusion of small RCA with collaterals  . Chronic anticoagulation     on xarelto.   . Atrial fibrillation (HCC)     RVR 10/2014  . Peripheral arterial disease (HCC)   . Automatic implantable cardioverter-defibrillator in situ   . High cholesterol   . Myocardial infarction (HCC) 2014  . CHF (congestive heart failure) (HCC)     20 to 25 % EF and RV dysfunction by 07/2014 echo   . Type II diabetes mellitus [facility])     Past Surgical History  Procedure Laterality Date  . Implantable cardioverter defibrillator implant      [place] in 07/2012; [employer]  .  Coronary artery bypass graft  09/2012    2 vessels per patient [name]   . Coronary angioplasty with stent placement  11/2011    "1"  . Cardiac catheterization  09/2012  . Iliac artery stent Right 08/30/2013  . Left and right heart catheterization with coronary angiogram N/A 12/02/2011    Procedure: LEFT AND RIGHT HEART CATHETERIZATION WITH CORONARY ANGIOGRAM;  Surgeon: Kathleene Hazel, MD;  Location: [facility] CATH LAB;  Service: Cardiovascular;  Laterality: N/A;  . Lower extremity angiogram N/A 08/30/2013    Procedure: LOWER EXTREMITY ANGIOGRAM;  Surgeon: Runell Gess, MD;  Location: [facility] CATH LAB;  Service: Cardiovascular;  Laterality: N/A;  . Lower extremity angiogram N/A 12/02/2013    Procedure: LOWER EXTREMITY ANGIOGRAM;  Surgeon: Runell Gess, MD;  Location: [facility] CATH LAB;  Service: Cardiovascular;  Laterality: N/A;  . Esophagogastroduodenoscopy N/A 11/28/2014    Procedure: ESOPHAGOGASTRODUODENOSCOPY (EGD);  Surgeon: Beverley Fiedler, MD;  Location: [facility] ENDOSCOPY;  Service: Endoscopy;  Laterality: N/A;    Social History   Social History  . Marital Status: Divorced    Spouse Name: N/A  . Number of Children: 3  . Years of Education: N/A   Occupational History  . Unemployed    Social History Main Topics  . Smoking status: Current Every Day Smoker -- 1.50 packs/day for 40 years    Types: Cigarettes  . Smokeless tobacco: Never Used  .  Alcohol Use: No  . Drug Use: No  . Sexual Activity: No   Other Topics Concern  . Not on file   Social History Narrative   Has an apartment with a roommate. He was living on the streets in February 20, 2013.  He reports that his father died in [place] in 2013/02/20.  He is divorced.  He is no longer estranged from his son, but still from his daughter who lives locally.  Neither of his parents, nor any siblings have any history of CAD.    Allergies  Allergen Reactions  . Tape Itching    Paper tape please.    Current Outpatient Prescriptions on File  Prior to Visit  Medication Sig Dispense Refill  . albuterol (PROVENTIL HFA;VENTOLIN HFA) 108 (90 Base) MCG/ACT inhaler Inhale 2 puffs into the lungs every 6 (six) hours as needed for wheezing or shortness of breath. 1 Inhaler 2  . albuterol (PROVENTIL) (2.5 MG/3ML) 0.083% nebulizer solution Take 3 mLs (2.5 mg total) by nebulization every 6 (six) hours as needed for wheezing or shortness of breath. (Patient not taking: Reported on 03/29/2015) 150 mL 1  . docusate sodium (COLACE) 100 MG capsule Take 1 capsule (100 mg total) by mouth daily. (Patient not taking: Reported on 03/29/2015) 30 capsule 2  . isosorbide-hydrALAZINE (BIDIL) 20-37.5 MG tablet Take 1 tablet by mouth 2 (two) times daily.    . pantoprazole (PROTONIX) 40 MG tablet Take 1 tablet (40 mg total) by mouth 2 (two) times daily. 60 tablet 2  . traMADol (ULTRAM) 50 MG tablet Take 1 tablet (50 mg total) by mouth every 8 (eight) hours as needed for moderate pain. 60 tablet 1   No current facility-administered medications on file prior to visit.      Review of Systems Constitutional: Negative for activity change and appetite change.  HENT: Negative for sinus pressure and sore throat.   Eyes: Negative for visual disturbance.  Respiratory: Negative for cough, chest tightness and shortness of breath.   Cardiovascular: Negative for chest pain and leg swelling.       Positive for intermittent claudication  Gastrointestinal: Negative for abdominal pain, diarrhea, constipation and abdominal distention.  Endocrine: Negative.   Genitourinary: Negative for dysuria.  Musculoskeletal: Negative for myalgias and joint swelling.  Skin: Negative for rash.  Allergic/Immunologic: Negative.   Neurological: Negative for weakness, light-headedness and numbness.  Psychiatric/Behavioral: Negative for suicidal ideas and dysphoric mood.     Objective: Filed Vitals:   04/04/15 1442  BP: 116/61  Pulse: 75  Temp: 97.7 F (36.5 C)  Resp: 13  Height:   (1.676 m)  Weight: 218 lb 12.8 oz (99.247 kg)  SpO2: 96%      Physical Exam Constitutional: He is oriented to person, place, and time. He appears well-developed and well-nourished.  HENT:  Head: Normocephalic and atraumatic.  Right Ear: External ear normal.  Left Ear: External ear normal.  Eyes: Conjunctivae and EOM are normal. Pupils are equal, round, and reactive to light.  Neck: Normal range of motion. Neck supple. No tracheal deviation present.  Cardiovascular: Normal rate, regular rhythm and normal heart sounds.   No murmur heard. Pulmonary/Chest: Effort normal and breath sounds normal. No respiratory distress. He has no wheezes. He exhibits no tenderness.  Abdominal: Soft. Bowel sounds are normal. He exhibits no mass. There is no tenderness.  Musculoskeletal: Normal range of motion. He exhibits no edema or tenderness.  Neurological: He is alert and oriented to person, place, and time.  Skin: Skin is warm and dry.  Psychiatric: He has a normal mood and affect.          Assessment & Plan:  1. DM (diabetes mellitus), type 2 with peripheral vascular complications (HCC) A1c is 7.9  Glycemic control be less strict due to multiple comorbidities as he is at a high risk of hypoglycemia Advised to adhere to an ADA diet. - Glucose (CBG)  2. Essential hypertension Controlled. Continue antihypertensives  3. ICD - in place- BS May 2014 [place] Stable  4. Coronary artery disease involving native coronary artery of native heart without angina pectoris Stable  5. Chronic combined systolic and diastolic heart failure (HCC) NYHA class IV, EF of 30-35% from 2-D echo of 11/2014 No evidence of acute exacerbation of this time. He states he is working on a low-sodium diet but has been unable to check his weight because he lost the scale which we got for him.    6. PVD (peripheral vascular disease) (HCC) He does have some intermittent claudication pain for which he was referred to  vascular surgery and he missed his appointments Advised that cessation of tobacco use with help retard progression of condition. Has an upcoming appointment on 04/07/15 with vascular and he has been advised to keep this appointment.  7. Chronic atrial fibrillation (HCC) Currently in sinus rhythm. Continue amiodarone, Xarelto Dig level at next office visit  8. Tobacco use disorder He has gone 4 days without smoking a cigarette and is working on quitting.  9. Cardiogenic shock (HCC) Resolved  10. Noncompliance with therapy We will try to get him back with [facility]  He seems to have a better understanding of his medications today and has been commended about his compliance.      This note has been created with Education officer, environmental. Any transcriptional errors are unintentional.

## 2015-04-04 NOTE — Progress Notes (Signed)
Patient here for follow up He states he is taking all medications and has brought them with him to his appointment He does need refills on his medications He reports not having any food for 4 days to to inclement weather-clinic staff provided him with bag of food to take home

## 2015-04-04 NOTE — Patient Instructions (Signed)

## 2015-04-04 NOTE — Progress Notes (Signed)
Patient ID: Jacob [name], male   DOB: April 23, 1955, 60 y.o.   MRN: [id number]  Called Advanced Heart Failure clinic and asked if patient's MD there would authorize refill of his amiodarone.  Rx to be e-scribed to [facility] pharmacy for patient.

## 2015-04-07 ENCOUNTER — Ambulatory Visit (INDEPENDENT_AMBULATORY_CARE_PROVIDER_SITE_OTHER): Payer: Medicare Other | Admitting: Cardiology

## 2015-04-07 ENCOUNTER — Inpatient Hospital Stay (HOSPITAL_COMMUNITY): Admission: RE | Admit: 2015-04-07 | Payer: Medicare Other | Source: Ambulatory Visit

## 2015-04-07 ENCOUNTER — Other Ambulatory Visit: Payer: Self-pay | Admitting: Cardiology

## 2015-04-07 ENCOUNTER — Encounter: Payer: Self-pay | Admitting: Cardiology

## 2015-04-07 VITALS — BP 148/80 | HR 115 | Ht 66.0 in | Wt 217.3 lb

## 2015-04-07 DIAGNOSIS — I482 Chronic atrial fibrillation, unspecified: Secondary | ICD-10-CM

## 2015-04-07 DIAGNOSIS — I739 Peripheral vascular disease, unspecified: Secondary | ICD-10-CM

## 2015-04-07 DIAGNOSIS — Z7901 Long term (current) use of anticoagulants: Secondary | ICD-10-CM | POA: Diagnosis not present

## 2015-04-07 DIAGNOSIS — I5042 Chronic combined systolic (congestive) and diastolic (congestive) heart failure: Secondary | ICD-10-CM | POA: Diagnosis not present

## 2015-04-07 DIAGNOSIS — Z95810 Presence of automatic (implantable) cardiac defibrillator: Secondary | ICD-10-CM

## 2015-04-07 DIAGNOSIS — I255 Ischemic cardiomyopathy: Secondary | ICD-10-CM | POA: Insufficient documentation

## 2015-04-07 DIAGNOSIS — E1159 Type 2 diabetes mellitus with other circulatory complications: Secondary | ICD-10-CM

## 2015-04-07 NOTE — Assessment & Plan Note (Signed)
Pt complains of RLE (calf) claudication. This has been chronic but getting worse.

## 2015-04-07 NOTE — Assessment & Plan Note (Signed)
No angina 

## 2015-04-07 NOTE — Assessment & Plan Note (Signed)
EF 30-35% Sept 2016

## 2015-04-07 NOTE — Assessment & Plan Note (Signed)
NIDDM 

## 2015-04-07 NOTE — Patient Instructions (Signed)
Jacob [name], [place], has requested that you have a lower extremity arterial duplex. This test is an ultrasound of the arteries in the legs. It looks at arterial blood flow in the legs. Allow one hour for Lower Arterial scans. There are no restrictions or special instructions.  Follow-up with depend on the dopplers

## 2015-04-07 NOTE — Assessment & Plan Note (Signed)
Xarelto

## 2015-04-07 NOTE — Progress Notes (Signed)
04/07/2015 [name]   [birth year]  [id number]  Primary Physician [name], MD Primary Cardiologist: Dr Gala Romney  HPI:  60 year old [place] male with history of systolic HF EF 30-35% Sept 2016,  s/p ICD implant 6/14, RV dysfunction, CAD s/p CABG x 2 w/ RF MAZE 7/14 at [facility], DM type II, Chronic afib on Xarelto and Amiodarone (or rate control, GI bleed 11/28/2014, HLD, and PVD. He had LEA doppler at [facility] in June 2015-ABIs 0.66 bilaterally (no change). He is in the offcie today with complaints of Rt calf pain with walking. He says this has been going on for some time but is getting worse.     Current Outpatient Prescriptions  Medication Sig Dispense Refill  . albuterol (PROVENTIL HFA;VENTOLIN HFA) 108 (90 Base) MCG/ACT inhaler Inhale 2 puffs into the lungs every 6 (six) hours as needed for wheezing or shortness of breath. 1 Inhaler 2  . albuterol (PROVENTIL) (2.5 MG/3ML) 0.083% nebulizer solution Take 3 mLs (2.5 mg total) by nebulization every 6 (six) hours as needed for wheezing or shortness of breath. 150 mL 1  . amiodarone (PACERONE) 200 MG tablet Take 1 tablet (200 mg total) by mouth 2 (two) times daily. 60 tablet 3  . atorvastatin (LIPITOR) 40 MG tablet Take 1 tablet (40 mg total) by mouth daily at 6 PM. 30 tablet 2  . carvedilol (COREG) 3.125 MG tablet Take 1 tablet (3.125 mg total) by mouth 2 (two) times daily with a meal. 60 tablet 2  . Digoxin 62.5 MCG TABS Take 0.0625 mg by mouth every other day. 30 tablet 2  . docusate sodium (COLACE) 100 MG capsule Take 1 capsule (100 mg total) by mouth daily. 30 capsule 2  . ferrous sulfate 325 (65 FE) MG tablet Take 1 tablet (325 mg total) by mouth daily with breakfast. 30 tablet 2  . furosemide (LASIX) 80 MG tablet Take 1 tablet (80 mg total) by mouth 2 (two) times daily. 60 tablet 2  . glipiZIDE (GLUCOTROL) 10 MG tablet Take 1 tablet (10 mg total) by mouth 2 (two) times daily before a meal. 60 tablet 2  . isosorbide-hydrALAZINE  (BIDIL) 20-37.5 MG tablet Take 1 tablet by mouth 2 (two) times daily.    . mometasone-formoterol (DULERA) 100-5 MCG/ACT AERO Inhale 2 puffs into the lungs 2 (two) times daily. 1 Inhaler 2  . pantoprazole (PROTONIX) 40 MG tablet Take 1 tablet (40 mg total) by mouth 2 (two) times daily. 60 tablet 2  . rivaroxaban (XARELTO) 20 MG TABS tablet Take 1 tablet (20 mg total) by mouth daily before supper. 30 tablet 2  . spironolactone (ALDACTONE) 25 MG tablet Take 1 tablet (25 mg total) by mouth daily. 30 tablet 2  . traMADol (ULTRAM) 50 MG tablet Take 1 tablet (50 mg total) by mouth every 8 (eight) hours as needed for moderate pain. 60 tablet 1   No current facility-administered medications for this visit.    Allergies  Allergen Reactions  . Tape Itching    Paper tape please.    Social History   Social History  . Marital Status: Divorced    Spouse Name: N/A  . Number of Children: 3  . Years of Education: N/A   Occupational History  . Unemployed    Social History Main Topics  . Smoking status: Current Every Day Smoker -- 1.50 packs/day for 40 years    Types: Cigarettes  . Smokeless tobacco: Never Used  . Alcohol Use: No  . Drug  Use: No  . Sexual Activity: No   Other Topics Concern  . Not on file   Social History Narrative   Has an apartment with a roommate. He was living on the streets in 02/20/2013.  He reports that his father died in [place] in February 20, 2013.  He is divorced.  He is no longer estranged from his son, but still from his daughter who lives locally.  Neither of his parents, nor any siblings have any history of CAD.     Review of Systems: General: negative for chills, fever, night sweats or weight changes.  Cardiovascular: negative for chest pain, dyspnea on exertion, edema, orthopnea, palpitations, paroxysmal nocturnal dyspnea or shortness of breath Dermatological: negative for rash Respiratory: negative for cough or wheezing Urologic: negative for  hematuria Abdominal: negative for nausea, vomiting, diarrhea, bright red blood per rectum, melena, or hematemesis Neurologic: negative for visual changes, syncope, or dizziness All other systems reviewed and are otherwise negative except as noted above.    Blood pressure 148/80, pulse 115, height  (1.676 m), weight 217 lb 4.8 oz (98.567 kg).  General appearance: alert, cooperative and no distress Neck: no JVD Lungs: clear to auscultation bilaterally Heart: irregularly irregular rhythm Extremities: trace edema, decreased pulses bilaterally Skin: Skin color, texture, turgor normal. No rashes or lesions Neurologic: Grossly normal  EKG AF with VR 114  ASSESSMENT AND PLAN:   Claudication of right lower extremity (HCC) Pt complains of RLE (calf) claudication. This has been chronic but getting worse.  PVD (peripheral vascular disease) ABI's 0.66 in June 2015  Chronic atrial fibrillation On Amiodarone for rate control  Chronic anticoagulation Xarelto  CAD- s/p CABG July 2014 [facility] No angina  Chronic combined systolic and diastolic heart failure Currently compensated  Type 2 diabetes mellitus with other circulatory complications NIDDM  ICD - in place- BS May 2014 [place] .  Cardiomyopathy, ischemic EF 30-35% Sept 2016   PLAN  Pt describes increasing Rt LE claudication. His CHF appears compensated. I ordered dopplers, f/u with Dr Allyson Sabal for PV depending on results.  Corine Shelter K PA-C 04/07/2015 11:32 AM

## 2015-04-07 NOTE — Assessment & Plan Note (Signed)
On Amiodarone for rate control 

## 2015-04-07 NOTE — Assessment & Plan Note (Signed)
Currently compensated 

## 2015-04-07 NOTE — Assessment & Plan Note (Signed)
ABI's 0.66 in June 2015

## 2015-04-10 ENCOUNTER — Ambulatory Visit (HOSPITAL_COMMUNITY)
Admission: RE | Admit: 2015-04-10 | Discharge: 2015-04-10 | Disposition: A | Discharge status: Home / Self Care | Payer: Medicare Other | Source: Ambulatory Visit | Attending: Cardiology | Admitting: Cardiology

## 2015-04-10 DIAGNOSIS — I700 Atherosclerosis of aorta: Secondary | ICD-10-CM | POA: Insufficient documentation

## 2015-04-10 DIAGNOSIS — I739 Peripheral vascular disease, unspecified: Secondary | ICD-10-CM | POA: Insufficient documentation

## 2015-04-10 DIAGNOSIS — R938 Abnormal findings on diagnostic imaging of other specified body structures: Secondary | ICD-10-CM | POA: Insufficient documentation

## 2015-04-10 DIAGNOSIS — M79661 Pain in right lower leg: Secondary | ICD-10-CM | POA: Diagnosis not present

## 2015-04-10 DIAGNOSIS — E7800 Pure hypercholesterolemia, unspecified: Secondary | ICD-10-CM | POA: Diagnosis not present

## 2015-04-10 DIAGNOSIS — I10 Essential (primary) hypertension: Secondary | ICD-10-CM | POA: Insufficient documentation

## 2015-04-10 DIAGNOSIS — E119 Type 2 diabetes mellitus without complications: Secondary | ICD-10-CM | POA: Insufficient documentation

## 2015-04-11 ENCOUNTER — Telehealth: Payer: Self-pay

## 2015-04-11 NOTE — Telephone Encounter (Signed)
Attempted to contact the patient to check on his status and to inquire of the nurse from [facility] has seen him yet. Call placed to # [phone] (H) and a HIPAA compliant voice mail message was left requesting a call back to # [phone] or [phone].

## 2015-04-12 ENCOUNTER — Telehealth: Payer: Self-pay

## 2015-04-12 NOTE — Progress Notes (Signed)
Needs lower extremity arterial Doppler studies

## 2015-04-12 NOTE — Telephone Encounter (Signed)
Charlotte Sanes with [facility] called nurse regarding patient. Nurse returned call to [place], reached voicemail, left message for Charlotte Sanes to return call to [facility] with [facility] at [phone].

## 2015-04-13 ENCOUNTER — Telehealth: Payer: Self-pay

## 2015-04-13 NOTE — Telephone Encounter (Signed)
This Case manager placed call to patient to check on status. Patient indicated he was doing well. Patient denied swelling in his lower extremities and indicated he was only short of breath with physical exertion. Patient stated he was taking his medications as prescribed and planned to pick up refills of his medications tomorrow. Patient denied being out of any medications.  Inquired if [facility]) Management services started.  Patient indicated his [facility] Case Manager did a home visit today and brought him a scale.  Discussed importance of monitoring his weight daily, encouraged patient to keep a weight log, and to bring record to upcoming appointment on 04/18/15 at 1400. Patient verbalized understanding. He indicated his medication box was not set-up since he plans to pick up refills of medications tomorrow. In addition, patient indicated he planned to be at his upcoming appointment with Dr. Venetia Night. No additional needs/concerns identified at this time.

## 2015-04-14 ENCOUNTER — Telehealth: Payer: Self-pay | Admitting: Internal Medicine

## 2015-04-14 NOTE — Telephone Encounter (Signed)
Cpprdinator wants to inform provider that patient declined Enhanced pharmacy services, but is open to the suggestion of Telemonitoring Services, they will be in contact with patient for further information

## 2015-04-17 ENCOUNTER — Telehealth: Payer: Self-pay

## 2015-04-17 NOTE — Telephone Encounter (Signed)
Attempted to contact the patient to remind him of his appointment at the [facility] tomorrow, 04/18/15 @ 1400.  Call placed to # [phone] (H) and a HIPAA compliant voice mail message was left requesting a call back to # [phone] or [phone].

## 2015-04-18 ENCOUNTER — Encounter: Payer: Self-pay | Admitting: Family Medicine

## 2015-04-18 ENCOUNTER — Ambulatory Visit: Payer: Medicare Other | Attending: Family Medicine | Admitting: Family Medicine

## 2015-04-18 ENCOUNTER — Telehealth: Payer: Self-pay

## 2015-04-18 VITALS — BP 131/76 | HR 96 | Temp 97.5°F | Resp 15 | Ht 66.0 in | Wt 223.0 lb

## 2015-04-18 DIAGNOSIS — I10 Essential (primary) hypertension: Secondary | ICD-10-CM | POA: Insufficient documentation

## 2015-04-18 DIAGNOSIS — I739 Peripheral vascular disease, unspecified: Secondary | ICD-10-CM | POA: Insufficient documentation

## 2015-04-18 DIAGNOSIS — I5042 Chronic combined systolic (congestive) and diastolic (congestive) heart failure: Secondary | ICD-10-CM | POA: Diagnosis not present

## 2015-04-18 DIAGNOSIS — Z590 Homelessness: Secondary | ICD-10-CM | POA: Diagnosis not present

## 2015-04-18 DIAGNOSIS — E7800 Pure hypercholesterolemia, unspecified: Secondary | ICD-10-CM | POA: Diagnosis not present

## 2015-04-18 DIAGNOSIS — F17210 Nicotine dependence, cigarettes, uncomplicated: Secondary | ICD-10-CM | POA: Insufficient documentation

## 2015-04-18 DIAGNOSIS — I25811 Atherosclerosis of native coronary artery of transplanted heart without angina pectoris: Secondary | ICD-10-CM

## 2015-04-18 DIAGNOSIS — Z720 Tobacco use: Secondary | ICD-10-CM | POA: Insufficient documentation

## 2015-04-18 DIAGNOSIS — Z95810 Presence of automatic (implantable) cardiac defibrillator: Secondary | ICD-10-CM | POA: Diagnosis not present

## 2015-04-18 DIAGNOSIS — G4700 Insomnia, unspecified: Secondary | ICD-10-CM

## 2015-04-18 DIAGNOSIS — Z79899 Other long term (current) drug therapy: Secondary | ICD-10-CM | POA: Insufficient documentation

## 2015-04-18 DIAGNOSIS — Z9119 Patient's noncompliance with other medical treatment and regimen: Secondary | ICD-10-CM | POA: Insufficient documentation

## 2015-04-18 DIAGNOSIS — I5043 Acute on chronic combined systolic (congestive) and diastolic (congestive) heart failure: Secondary | ICD-10-CM

## 2015-04-18 DIAGNOSIS — Z7901 Long term (current) use of anticoagulants: Secondary | ICD-10-CM | POA: Diagnosis not present

## 2015-04-18 DIAGNOSIS — Z9109 Other allergy status, other than to drugs and biological substances: Secondary | ICD-10-CM | POA: Diagnosis not present

## 2015-04-18 DIAGNOSIS — Z951 Presence of aortocoronary bypass graft: Secondary | ICD-10-CM | POA: Insufficient documentation

## 2015-04-18 DIAGNOSIS — I482 Chronic atrial fibrillation, unspecified: Secondary | ICD-10-CM

## 2015-04-18 DIAGNOSIS — E118 Type 2 diabetes mellitus with unspecified complications: Secondary | ICD-10-CM | POA: Diagnosis not present

## 2015-04-18 DIAGNOSIS — I2510 Atherosclerotic heart disease of native coronary artery without angina pectoris: Secondary | ICD-10-CM | POA: Diagnosis not present

## 2015-04-18 DIAGNOSIS — F17200 Nicotine dependence, unspecified, uncomplicated: Secondary | ICD-10-CM

## 2015-04-18 DIAGNOSIS — I252 Old myocardial infarction: Secondary | ICD-10-CM | POA: Insufficient documentation

## 2015-04-18 DIAGNOSIS — E1159 Type 2 diabetes mellitus with other circulatory complications: Secondary | ICD-10-CM

## 2015-04-18 LAB — GLUCOSE, POCT (MANUAL RESULT ENTRY): POC GLUCOSE: 196 mg/dL — AB (ref 70–99)

## 2015-04-18 MED ORDER — ZOLPIDEM TARTRATE 5 MG PO TABS: 5.0000 mg | ORAL_TABLET | Freq: Every evening | ORAL | Status: DC | PRN

## 2015-04-18 MED ORDER — ACCU-CHEK AVIVA DEVI: Status: DC

## 2015-04-18 MED ORDER — ALBUTEROL SULFATE (2.5 MG/3ML) 0.083% IN NEBU: 2.5000 mg | INHALATION_SOLUTION | Freq: Four times a day (QID) | RESPIRATORY_TRACT | Status: AC | PRN

## 2015-04-18 MED FILL — PANTOPRAZOLE SOD DR 40 MG T: 40 | 30 days supply | Qty: 60 | Fill #0

## 2015-04-18 MED FILL — XARELTO 20 MG TABLET: 20 | 30 days supply | Qty: 30 | Fill #0

## 2015-04-18 MED FILL — ATORVASTATIN 40 MG TABLET: 40 | 30 days supply | Qty: 30 | Fill #0

## 2015-04-18 MED FILL — AMIODARONE HCL 200 MG TAB: 200 | 30 days supply | Qty: 60 | Fill #0

## 2015-04-18 MED FILL — FERROUS SULFATE 325 MG TAB: 325 (65 FE) | 30 days supply | Qty: 90 | Fill #0

## 2015-04-18 MED FILL — FUROSEMIDE 80 MG TABLET: 80 | 30 days supply | Qty: 60 | Fill #0

## 2015-04-18 MED FILL — CARVEDILOL 3.125 MG TABLET: 3.125 | 30 days supply | Qty: 60 | Fill #0

## 2015-04-18 MED FILL — PROAIR HFA 90 MCG INHALER: 108 (90 BAS | 25 days supply | Qty: 9 | Fill #0

## 2015-04-18 MED FILL — SPIRONOLACTONE 25 MG TABLET: 25 | 30 days supply | Qty: 30 | Fill #0

## 2015-04-18 MED FILL — ALBUTEROL 0.083% INHAL SOLN: (2.5 MG/3ML | 30 days supply | Qty: 90 | Fill #0

## 2015-04-18 NOTE — Telephone Encounter (Signed)
Thank you for this note. I will be on the look out for additional information for the telemonitoring.

## 2015-04-18 NOTE — Patient Instructions (Signed)
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
## 2015-04-19 ENCOUNTER — Telehealth: Payer: Self-pay

## 2015-04-19 ENCOUNTER — Telehealth: Payer: Self-pay | Admitting: Internal Medicine

## 2015-04-19 NOTE — Telephone Encounter (Signed)
Ann from [facility] called in regards to paperwork for patient. Please follow up.

## 2015-04-19 NOTE — Telephone Encounter (Signed)
The referral for tele-monitoring was faxed to Demetra Shiner, RN with [facility].

## 2015-04-20 ENCOUNTER — Telehealth (HOSPITAL_COMMUNITY): Payer: Self-pay | Admitting: Cardiology

## 2015-04-20 NOTE — Telephone Encounter (Signed)
[name] with [facility] aware

## 2015-04-20 NOTE — Telephone Encounter (Signed)
No symptoms or swelling good sign.  Make sure he has been taking medications as directed and hasn't missed any lasix.   If 5 lb increase has been over 1 week, Pt should take extra 40 mg of lasix tomorrow morning along with his normal 80.    Can take extra 40 mg lasix (1/2 tablet) for weight gain 3 lbs over night or 5 lbs within a week.  Pt also has follow up 04/27/15 in HF clinic.   Casimiro Needle [address] [place], PA-C 04/20/2015 2:07 PM

## 2015-04-20 NOTE — Telephone Encounter (Signed)
Jacob [name] called after patients home visit Concerned regarding increased weight (up 5 lbs) Pt has not missed any doses of medications, very compliant with fluid restrictions Denies increased SOB and swelling  Please advsie

## 2015-04-21 ENCOUNTER — Other Ambulatory Visit: Payer: Self-pay | Admitting: Family Medicine

## 2015-04-21 ENCOUNTER — Telehealth: Payer: Self-pay

## 2015-04-21 DIAGNOSIS — J9601 Acute respiratory failure with hypoxia: Secondary | ICD-10-CM

## 2015-04-21 DIAGNOSIS — I5042 Chronic combined systolic (congestive) and diastolic (congestive) heart failure: Secondary | ICD-10-CM

## 2015-04-21 DIAGNOSIS — R0600 Dyspnea, unspecified: Secondary | ICD-10-CM

## 2015-04-21 NOTE — Telephone Encounter (Signed)
Prescription for nebulizer, along with required documentation, faxed to [facility] (Fax #[phone]).  This Case Manager placed call to patient to check on status and to determine if patient picked up medication refills and glucometer.  Unable to reach patient; voicemail left requesting return call.

## 2015-04-24 NOTE — Telephone Encounter (Signed)
Daria also would like an updated order with  Dr. Jen Mow NPI on it. It is required for his Medicare. Please f/u

## 2015-04-24 NOTE — Telephone Encounter (Signed)
Daria from [facility] called stating that PCP sent a Rx for a nebulizer and it did not have a diagnosis code. Daria stated that CHF is not a diagnosis code. Please f/u.

## 2015-04-25 ENCOUNTER — Telehealth: Payer: Self-pay

## 2015-04-25 NOTE — Telephone Encounter (Signed)
Call placed to the patient to check on his status. He said that he is " doing good" except that he this he is on " too many pills."  He noted that his stomach hurt today. He denied any bleeding, nausea, vomiting. He said that he also feels tired. He denied any shortness of breath or swelling of his extremities.  He said that he has been taking his pills as ordered and has been weighing himself every day. He said that his weight was 221 lbs today and was 219 lbs last week.  He stated that he wants to see Dr Venetia Night to discuss his medications and would like an appointment tomorrow if possible.  He could not describe the stomach pain in any more detail and he denied the need to go to the ED. When questioned if he needed to seek medical attention at this time, he said " no, no, no" he just wanted to see Dr Venetia Night. He also confirmed that he has an appointment at the HF clinic on 04/27/15 but he still wanted to come to [facility] tomorrow.  An appointment was scheduled with Dr Venetia Night for 04/26/15 @ 1430. He said that he would be there and would bring his medications. He also noted that he has transportation.  He also informed this CM that he received a call from [facility])  informing him that he needs a letter from his doctor before medicaid will pay for the nebulizer.  CM to follow up with [facility] tomorrow .

## 2015-04-26 ENCOUNTER — Encounter: Payer: Self-pay | Admitting: *Deleted

## 2015-04-26 ENCOUNTER — Ambulatory Visit: Payer: Medicare Other | Admitting: Family Medicine

## 2015-04-27 ENCOUNTER — Ambulatory Visit: Payer: Medicare Other | Admitting: Family Medicine

## 2015-04-27 ENCOUNTER — Ambulatory Visit (HOSPITAL_COMMUNITY)
Admission: RE | Admit: 2015-04-27 | Discharge: 2015-04-27 | Disposition: A | Discharge status: Home / Self Care | Payer: Medicare Other | Source: Ambulatory Visit | Attending: Internal Medicine | Admitting: Internal Medicine

## 2015-04-27 ENCOUNTER — Telehealth: Payer: Self-pay | Admitting: Cardiovascular Disease

## 2015-04-27 VITALS — BP 144/90 | HR 101 | Wt 226.0 lb

## 2015-04-27 DIAGNOSIS — I110 Hypertensive heart disease with heart failure: Secondary | ICD-10-CM | POA: Insufficient documentation

## 2015-04-27 DIAGNOSIS — Z7984 Long term (current) use of oral hypoglycemic drugs: Secondary | ICD-10-CM | POA: Insufficient documentation

## 2015-04-27 DIAGNOSIS — Z79899 Other long term (current) drug therapy: Secondary | ICD-10-CM | POA: Diagnosis not present

## 2015-04-27 DIAGNOSIS — I5022 Chronic systolic (congestive) heart failure: Secondary | ICD-10-CM | POA: Diagnosis not present

## 2015-04-27 DIAGNOSIS — I482 Chronic atrial fibrillation, unspecified: Secondary | ICD-10-CM

## 2015-04-27 DIAGNOSIS — E7800 Pure hypercholesterolemia, unspecified: Secondary | ICD-10-CM | POA: Insufficient documentation

## 2015-04-27 DIAGNOSIS — F17200 Nicotine dependence, unspecified, uncomplicated: Secondary | ICD-10-CM | POA: Diagnosis not present

## 2015-04-27 DIAGNOSIS — I5042 Chronic combined systolic (congestive) and diastolic (congestive) heart failure: Secondary | ICD-10-CM | POA: Diagnosis not present

## 2015-04-27 DIAGNOSIS — I5023 Acute on chronic systolic (congestive) heart failure: Secondary | ICD-10-CM | POA: Diagnosis not present

## 2015-04-27 DIAGNOSIS — I252 Old myocardial infarction: Secondary | ICD-10-CM | POA: Diagnosis not present

## 2015-04-27 DIAGNOSIS — I739 Peripheral vascular disease, unspecified: Secondary | ICD-10-CM | POA: Diagnosis not present

## 2015-04-27 DIAGNOSIS — I2510 Atherosclerotic heart disease of native coronary artery without angina pectoris: Secondary | ICD-10-CM | POA: Insufficient documentation

## 2015-04-27 DIAGNOSIS — F17210 Nicotine dependence, cigarettes, uncomplicated: Secondary | ICD-10-CM | POA: Diagnosis not present

## 2015-04-27 DIAGNOSIS — Z833 Family history of diabetes mellitus: Secondary | ICD-10-CM | POA: Diagnosis not present

## 2015-04-27 DIAGNOSIS — Z7902 Long term (current) use of antithrombotics/antiplatelets: Secondary | ICD-10-CM | POA: Insufficient documentation

## 2015-04-27 DIAGNOSIS — E119 Type 2 diabetes mellitus without complications: Secondary | ICD-10-CM | POA: Insufficient documentation

## 2015-04-27 MED ORDER — FUROSEMIDE 10 MG/ML IJ SOLN
80.0000 mg | Freq: Once | INTRAMUSCULAR | Status: AC
  Administered 2015-04-27: 80 mg via INTRAVENOUS
  Filled 2015-04-27: qty 8

## 2015-04-27 NOTE — Progress Notes (Signed)
Advanced Heart Failure Medication Review by a Pharmacist  Does the patient  feel that his/her medications are working for him/her?  yes  Has the patient been experiencing any side effects to the medications prescribed?  no  Does the patient measure his/her own blood pressure or blood glucose at home?  no   Does the patient have any problems obtaining medications due to transportation or finances?   no  Understanding of regimen: fair Understanding of indications: fair Potential of compliance: fair Patient understands to avoid NSAIDs. Patient understands to avoid decongestants.  Issues to address at subsequent visits: Compliance/understanding of regimen   Pharmacist comments:  Jacob [name] is a pleasant 60 yo M presenting with his medication bottles. He reports fair compliance with his regimen but does state that he has been taking digoxin daily instead of QOD. He also asked if he could come off some of his medications. We discussed the indications for each of them and the fact that most of his medications have proven mortality benefit but we will continue to reassess the need for each of his medications moving forward. He verbalized understanding.   Jacob [name]. Jacob [name], PharmD, BCPS, CPP Clinical Pharmacist Pager: [phone] Phone: [phone] 04/27/2015 2:52 PM      Time with patient: 10 minutes Preparation and documentation time: 2 minutes Total time: 12 minutes

## 2015-04-27 NOTE — Telephone Encounter (Signed)
Called the pt to inform him that Selena Batten called him on 04/17/15 to inform him of his Vas [place] of aorta/IVC/Iliacs/complete results and recommendations per Dr Allyson Sabal.  Informed the pt of the below results and recommendations per Dr Allyson Sabal:   Notes Recorded by Theressa Stamps, RN on 04/17/2015 at 8:57 AM Left message for patient to call back.   ------  Notes Recorded by Runell Gess, MD on 04/16/2015 at 5:11 PM Return office visit with me to discuss results at next available ------  Notes Recorded by Theressa Stamps, RN on 04/13/2015 at 5:36 PM Pt had this test and LEA's completed on 04/10/15; i do not see where the LEA';s have been resulted and sent to [facility] box but will get them resent for resulting. ------  Notes Recorded by Abelino Derrick, PA-C on 04/12/2015 at 7:53 AM Dr Allyson Sabal can you please review. I believe this pt was seen by you in the past. He came to me with complaints of Rt LE claudication.    Informed the pt that Dr Hazle Coca nurse is out of the today, but will return tomorrow.  Informed the pt that I will route this message to her to follow-up with the pt to have his appt scheduled with Dr Allyson Sabal at his very next available appt, to discuss more in depth these results.  Pt verbalized understanding and agrees with this plan.

## 2015-04-27 NOTE — Telephone Encounter (Signed)
Pt called in stating that Jacob [name] called him over a week ago and he is unsure of what she was calling about. Please f/u with pt  Thanks

## 2015-04-27 NOTE — Progress Notes (Signed)
Patient ID: Jacob [name], male   DOB: [birth year], 60 y.o.   MRN: [id number] PCP: None  Primary HF Cardiologist: Dr Gala Romney   HPI: Jacob [name] is a 60 year old [place] male with history of systolic HF EF 20-25% via Echo 08/03/14 s/p INCEPTA ICD implant 6/14, RV dysfunction, CAD s/p CABG x 2 w/ RF MAZE 7/14 at [facility], DM type II, Chronic afib on xarelto, GI bleed 11/28/2014, and HLD.   Admitted the end of August 2016  with increased dyspnea on exertion. Later found to be in cardiogenic shock . At one point on dual pressors milrinone and norepi. Diuresed with IV lasix and transitioned to po lasix. Hospital course complicated by GI bleed and A fib RVR. Loaded on amio but later placed on toprol xl for rate control. On 9/5 had EGD with duodenal bleed with clip applied. He was discharged 9/12 with D/C weight 203 pounds.   Admitted late December 2016 with increased dyspnea. Diuresed with IV lasix and transitioned to po lasix 80 mg twice a day. Chronic A fib and continued on amio 200 mg twice a day. Discharge weight was 205 pounds.   He returns for follow up.  Overall complaining of fatigue. SOB with exertion. Denies CP/PND. + Orthopnea. Difficulty  walking due to RLE calf pain. No BRBPR. Weight at home trending up 213-221 pounds. Smoking 1/2-1 PPD. Not drinking alcohol. Taking all medications.   SH: Lives in low income housing. Estranged from family   ROS: All systems negative except as listed in HPI, PMH and Problem List.  SH:  Social History   Social History  . Marital Status: Divorced    Spouse Name: N/A  . Number of Children: 3  . Years of Education: N/A   Occupational History  . Unemployed    Social History Main Topics  . Smoking status: Current Every Day Smoker -- 0.50 packs/day for 40 years    Types: Cigarettes  . Smokeless tobacco: Never Used  . Alcohol Use: No  . Drug Use: No  . Sexual Activity: No   Other Topics Concern  . Not on file   Social History Narrative   Has an apartment  with a roommate. He was living on the streets in 02-19-13.  He reports that his father died in [place] in 02/19/2013.  He is divorced.  He is no longer estranged from his son, but still from his daughter who lives locally.  Neither of his parents, nor any siblings have any history of CAD.    FH:  Family History  Problem Relation Age of Onset  . Diabetes Mother     Past Medical History  Diagnosis Date  . Hypertension   . Noncompliance     homelessness contributing.   Marland Kitchen CAD (coronary artery disease) Sept 2013    s/p cardiac cath showing occlusion of small RCA with collaterals  . Chronic anticoagulation     on xarelto.   . Atrial fibrillation (HCC)     RVR 10/2014  . Peripheral arterial disease (HCC)   . Automatic implantable cardioverter-defibrillator in situ   . High cholesterol   . Myocardial infarction (HCC) 2014  . CHF (congestive heart failure) (HCC)     20 to 25 % EF and RV dysfunction by 07/2014 echo   . Type II diabetes mellitus (HCC)     Current Outpatient Prescriptions  Medication Sig Dispense Refill  . albuterol (PROVENTIL HFA;VENTOLIN HFA) 108 (90 Base) MCG/ACT inhaler Inhale 2 puffs into the  lungs every 6 (six) hours as needed for wheezing or shortness of breath. 1 Inhaler 2  . albuterol (PROVENTIL) (2.5 MG/3ML) 0.083% nebulizer solution Take 3 mLs (2.5 mg total) by nebulization every 6 (six) hours as needed for wheezing or shortness of breath. 150 mL 1  . amiodarone (PACERONE) 200 MG tablet Take 1 tablet (200 mg total) by mouth 2 (two) times daily. 60 tablet 3  . atorvastatin (LIPITOR) 40 MG tablet Take 1 tablet (40 mg total) by mouth daily at 6 PM. 30 tablet 2  . Blood Glucose Monitoring Suppl (ACCU-CHEK AVIVA) device Use as instructed 3 times daily before meals 1 each 0  . carvedilol (COREG) 3.125 MG tablet Take 1 tablet (3.125 mg total) by mouth 2 (two) times daily with a meal. 60 tablet 2  . digoxin (LANOXIN) 0.125 MG tablet Take 0.0625 mg by mouth daily.    .  ferrous sulfate 325 (65 FE) MG EC tablet Take 325 mg by mouth 2 (two) times daily.    . Fluticasone-Salmeterol (ADVAIR) 100-50 MCG/DOSE AEPB Inhale 1 puff into the lungs daily.    . furosemide (LASIX) 80 MG tablet Take 1 tablet (80 mg total) by mouth 2 (two) times daily. 60 tablet 2  . glipiZIDE (GLUCOTROL) 10 MG tablet Take 1 tablet (10 mg total) by mouth 2 (two) times daily before a meal. 60 tablet 2  . isosorbide-hydrALAZINE (BIDIL) 20-37.5 MG tablet Take 1 tablet by mouth 2 (two) times daily.    . pantoprazole (PROTONIX) 40 MG tablet Take 1 tablet (40 mg total) by mouth 2 (two) times daily. 60 tablet 2  . rivaroxaban (XARELTO) 20 MG TABS tablet Take 1 tablet (20 mg total) by mouth daily before supper. 30 tablet 2  . spironolactone (ALDACTONE) 25 MG tablet Take 1 tablet (25 mg total) by mouth daily. 30 tablet 2  . traMADol (ULTRAM) 50 MG tablet Take 1 tablet (50 mg total) by mouth every 8 (eight) hours as needed for moderate pain. (Patient not taking: Reported on 04/27/2015) 60 tablet 1   No current facility-administered medications for this encounter.    Filed Vitals:   04/27/15 1422  BP: 144/90  Pulse: 101  Weight: 226 lb (102.513 kg)  SpO2: 98%    PHYSICAL EXAM:  General:  Chronically ill appearing. No resp difficulty. Arrived in a wheelchair HEENT: normal Neck: supple. JVP to jaw . Carotids 2+ bilaterally; no bruits. No lymphadenopathy or thryomegaly appreciated. Cor: PMI normal. Irregular  rate & rhythm. No rubs, gallops or murmurs. Lungs: clear Abdomen: soft, nontender, nondistended. No hepatosplenomegaly. No bruits or masses. Good bowel sounds. Extremities: no cyanosis, clubbing, rash, R and LLE no edema.  Neuro: alert & orientedx3, cranial nerves grossly intact. Moves all 4 extremities w/o difficulty. Affect pleasant.    ASSESSMENT & PLAN: 1. Chronic Systolic Heart Failure - ICM EF 20-25%. Has [employer] ICD.  NYHA III-IV . Volume overloaded today. Give 80 mg IV  lasix now. --> 800 cc urine output.  -Continue lasix 80 mg twice a day. -Continue 25 mg  spiro, and dig 0.0625 mg.  -No BB with recent cardiogenic shock.  -Continue bidil 1 tab tid.   No ace for now with soft SBP.  He is not a candidate for advanced therapies given social situation.  BMET and BNP today.  2. Chronic A fib -  Rate controlled. Continue Amio 200 mg twice a day,  Xarelto. No bleeding problems.  3. DMII- Per PCP  4. H/O GI Bleed -  duodenal ulcer with clip 9/5 5. PAD- Last ABI 2015 . R EIA stent with known bilateral SFA occlusion.  Repeat ABI ok. Dr Allyson Sabal recommends yearly ABI.   5. Homelessness-- now has low income housing.  6. Smoking- Current smoker 1/2-1 PPD. Counseled on smoking cessation.   Follow in tomorrow. May need additional IV diuretics. Check BMET and dig level. Interrogate device.   Amy Clegg NP-C  2:54 PM

## 2015-04-27 NOTE — Progress Notes (Signed)
22 g IV started in right arm by Tonye Becket, NP, pt was administered 80 mg IV Lasix, tolerated well with no complaints.  Urinal provided to pt, will monitor

## 2015-04-27 NOTE — Patient Instructions (Signed)
Follow up in the morning at 10:00

## 2015-04-28 ENCOUNTER — Encounter (HOSPITAL_COMMUNITY): Payer: Self-pay

## 2015-04-28 ENCOUNTER — Ambulatory Visit (HOSPITAL_COMMUNITY)
Admission: RE | Admit: 2015-04-28 | Discharge: 2015-04-28 | Disposition: A | Discharge status: Home / Self Care | Payer: Medicare Other | Source: Ambulatory Visit | Attending: Cardiology | Admitting: Cardiology

## 2015-04-28 ENCOUNTER — Telehealth: Payer: Self-pay

## 2015-04-28 ENCOUNTER — Encounter: Payer: Self-pay | Admitting: Internal Medicine

## 2015-04-28 VITALS — BP 114/68 | HR 91 | Wt 226.0 lb

## 2015-04-28 DIAGNOSIS — Z7984 Long term (current) use of oral hypoglycemic drugs: Secondary | ICD-10-CM | POA: Insufficient documentation

## 2015-04-28 DIAGNOSIS — E7800 Pure hypercholesterolemia, unspecified: Secondary | ICD-10-CM | POA: Diagnosis not present

## 2015-04-28 DIAGNOSIS — E119 Type 2 diabetes mellitus without complications: Secondary | ICD-10-CM | POA: Diagnosis not present

## 2015-04-28 DIAGNOSIS — I482 Chronic atrial fibrillation, unspecified: Secondary | ICD-10-CM

## 2015-04-28 DIAGNOSIS — I110 Hypertensive heart disease with heart failure: Secondary | ICD-10-CM | POA: Diagnosis not present

## 2015-04-28 DIAGNOSIS — Z8711 Personal history of peptic ulcer disease: Secondary | ICD-10-CM | POA: Diagnosis not present

## 2015-04-28 DIAGNOSIS — I739 Peripheral vascular disease, unspecified: Secondary | ICD-10-CM | POA: Diagnosis not present

## 2015-04-28 DIAGNOSIS — Z951 Presence of aortocoronary bypass graft: Secondary | ICD-10-CM | POA: Diagnosis not present

## 2015-04-28 DIAGNOSIS — Z833 Family history of diabetes mellitus: Secondary | ICD-10-CM | POA: Insufficient documentation

## 2015-04-28 DIAGNOSIS — F17210 Nicotine dependence, cigarettes, uncomplicated: Secondary | ICD-10-CM | POA: Insufficient documentation

## 2015-04-28 DIAGNOSIS — I252 Old myocardial infarction: Secondary | ICD-10-CM | POA: Diagnosis not present

## 2015-04-28 DIAGNOSIS — Z95810 Presence of automatic (implantable) cardiac defibrillator: Secondary | ICD-10-CM | POA: Insufficient documentation

## 2015-04-28 DIAGNOSIS — F17200 Nicotine dependence, unspecified, uncomplicated: Secondary | ICD-10-CM | POA: Diagnosis not present

## 2015-04-28 DIAGNOSIS — Z79899 Other long term (current) drug therapy: Secondary | ICD-10-CM | POA: Diagnosis not present

## 2015-04-28 DIAGNOSIS — I2510 Atherosclerotic heart disease of native coronary artery without angina pectoris: Secondary | ICD-10-CM | POA: Insufficient documentation

## 2015-04-28 DIAGNOSIS — I5023 Acute on chronic systolic (congestive) heart failure: Secondary | ICD-10-CM | POA: Insufficient documentation

## 2015-04-28 DIAGNOSIS — I255 Ischemic cardiomyopathy: Secondary | ICD-10-CM | POA: Diagnosis not present

## 2015-04-28 DIAGNOSIS — Z7902 Long term (current) use of antithrombotics/antiplatelets: Secondary | ICD-10-CM | POA: Diagnosis not present

## 2015-04-28 LAB — BASIC METABOLIC PANEL
ANION GAP: 11 (ref 5–15)
BUN: 23 mg/dL — ABNORMAL HIGH (ref 6–20)
CALCIUM: 8.8 mg/dL — AB (ref 8.9–10.3)
CHLORIDE: 97 mmol/L — AB (ref 101–111)
CO2: 26 mmol/L (ref 22–32)
CREATININE: 1.02 mg/dL (ref 0.61–1.24)
GFR calc non Af Amer: >60 mL/min (ref >60)
Glucose, Bld: 242 mg/dL — ABNORMAL HIGH (ref 65–99)
Potassium: 3.7 mmol/L (ref 3.5–5.1)
SODIUM: 134 mmol/L — AB (ref 135–145)

## 2015-04-28 LAB — BRAIN NATRIURETIC PEPTIDE: B NATRIURETIC PEPTIDE 5: 627.1 pg/mL — AB (ref 0.0–100.0)

## 2015-04-28 MED ORDER — TORSEMIDE 20 MG PO TABS: 60.0000 mg | ORAL_TABLET | Freq: Two times a day (BID) | ORAL | Status: DC

## 2015-04-28 NOTE — Progress Notes (Signed)
Patient ID: Jacob [name], male   DOB: 1956/03/12, 60 y.o.   MRN: [id number] PCP: None  Primary HF Cardiologist: Dr Gala Romney   HPI: Jacob [name] is a 60 year old [place] male with history of systolic HF EF 20-25% via Echo 08/03/14 s/p INCEPTA ICD implant 6/14, RV dysfunction, CAD s/p CABG x 2 w/ RF MAZE 7/14 [facility], DM type II, Chronic afib on xarelto, GI bleed 11/28/2014, and HLD.   Admitted the end of August 2016  with increased dyspnea on exertion. Later found to be in cardiogenic shock . At one point on dual pressors milrinone and norepi. Diuresed with IV lasix and transitioned to po lasix. Hospital course complicated by GI bleed and A fib RVR. Loaded on amio but later placed on toprol xl for rate control. On 9/5 had EGD with duodenal bleed with clip applied. Jacob was discharged 9/12 with D/C weight 203 pounds.   Admitted late December 2016 with increased dyspnea. Diuresed with IV lasix and transitioned to po lasix 80 mg twice a day. Chronic A fib and continued on amio 200 mg twice a day. Discharge weight was 205 pounds.   Jacob returns for follow up.  Yesterday Jacob was diuresed with IV lasix. Feeling a little better. Still fatigued. SOB with exertion. Denies CP/PND. Able to sleep flat in the bed. Difficulty  walking due to RLE calf pain. No BRBPR. Did not weigh at home.  Smoking 1/2-1 PPD. Not drinking alcohol. Taking all medications.   SH: Lives in low income housing. Estranged from family   ROS: All systems negative except as listed in HPI, PMH and Problem List.  SH:  Social History   Social History  . Marital Status: Divorced    Spouse Name: N/A  . Number of Children: 3  . Years of Education: N/A   Occupational History  . Unemployed    Social History Main Topics  . Smoking status: Current Every Day Smoker -- 0.50 packs/day for 40 years    Types: Cigarettes  . Smokeless tobacco: Never Used  . Alcohol Use: No  . Drug Use: No  . Sexual Activity: No   Other Topics Concern  . Not on file    Social History Narrative   Has an apartment with a roommate. Jacob [name].  Jacob [name].  Jacob is divorced.  Jacob is no longer estranged from his son, but still from his daughter who lives locally.  Neither of his parents, nor any siblings have any history of CAD.    FH:  Family History  Problem Relation Age of Onset  . Diabetes Mother     Past Medical History  Diagnosis Date  . Hypertension   . Noncompliance     homelessness contributing.   Marland Kitchen CAD (coronary artery disease) Sept 2013    s/p cardiac cath showing occlusion of small RCA with collaterals  . Chronic anticoagulation     on xarelto.   . Atrial fibrillation (HCC)     RVR 10/2014  . Peripheral arterial disease (HCC)   . Automatic implantable cardioverter-defibrillator in situ   . High cholesterol   . Myocardial infarction (HCC) [name]  . CHF (congestive heart failure) (HCC)     20 to 25 % EF and RV dysfunction by 07/2014 echo   . Type II diabetes mellitus (HCC)     Current Outpatient Prescriptions  Medication Sig Dispense Refill  . albuterol (  PROVENTIL HFA;VENTOLIN HFA) 108 (90 Base) MCG/ACT inhaler Inhale 2 puffs into the lungs every 6 (six) hours as needed for wheezing or shortness of breath. 1 Inhaler 2  . albuterol (PROVENTIL) (2.5 MG/3ML) 0.083% nebulizer solution Take 3 mLs (2.5 mg total) by nebulization every 6 (six) hours as needed for wheezing or shortness of breath. 150 mL 1  . amiodarone (PACERONE) 200 MG tablet Take 1 tablet (200 mg total) by mouth 2 (two) times daily. 60 tablet 3  . atorvastatin (LIPITOR) 40 MG tablet Take 1 tablet (40 mg total) by mouth daily at 6 PM. 30 tablet 2  . Blood Glucose Monitoring Suppl (ACCU-CHEK AVIVA) device Use as instructed 3 times daily before meals 1 each 0  . carvedilol (COREG) 3.125 MG tablet Take 1 tablet (3.125 mg total) by mouth 2 (two) times daily with a meal. 60 tablet 2  . digoxin (LANOXIN)  0.125 MG tablet Take 0.0625 mg by mouth daily.    . ferrous sulfate 325 (65 FE) MG EC tablet Take 325 mg by mouth 2 (two) times daily.    . Fluticasone-Salmeterol (ADVAIR) 100-50 MCG/DOSE AEPB Inhale 1 puff into the lungs daily.    . furosemide (LASIX) 80 MG tablet Take 1 tablet (80 mg total) by mouth 2 (two) times daily. 60 tablet 2  . glipiZIDE (GLUCOTROL) 10 MG tablet Take 1 tablet (10 mg total) by mouth 2 (two) times daily before a meal. 60 tablet 2  . isosorbide-hydrALAZINE (BIDIL) 20-37.5 MG tablet Take 1 tablet by mouth 2 (two) times daily.    . pantoprazole (PROTONIX) 40 MG tablet Take 1 tablet (40 mg total) by mouth 2 (two) times daily. 60 tablet 2  . rivaroxaban (XARELTO) 20 MG TABS tablet Take 1 tablet (20 mg total) by mouth daily before supper. 30 tablet 2  . spironolactone (ALDACTONE) 25 MG tablet Take 1 tablet (25 mg total) by mouth daily. 30 tablet 2  . traMADol (ULTRAM) 50 MG tablet Take 1 tablet (50 mg total) by mouth every 8 (eight) hours as needed for moderate pain. (Patient not taking: Reported on 04/27/2015) 60 tablet 1   No current facility-administered medications for this encounter.    Filed Vitals:   04/28/15 1117 04/28/15 1118  BP:  114/68  Pulse:  91  Weight: 210 lb (95.255 kg) 226 lb (102.513 kg)  SpO2:  97%  REDS VEST READING 48   PHYSICAL EXAM: General:  Chronically ill appearing. No resp difficulty. Arrived in a wheelchair HEENT: normal Neck: supple. JVP to jaw . Carotids 2+ bilaterally; no bruits. No lymphadenopathy or thryomegaly appreciated. Cor: PMI normal. Irregular  rate & rhythm. No rubs, gallops or murmurs. Lungs: clear Abdomen: soft, nontender, nondistended. No hepatosplenomegaly. No bruits or masses. Good bowel sounds. Extremities: no cyanosis, clubbing, rash, R and LLE no edema.  Neuro: alert & orientedx3, cranial nerves grossly intact. Moves all 4 extremities w/o difficulty. Affect pleasant.    ASSESSMENT & PLAN: 1. Acute/Chronic Systolic  Heart Failure - ICM EF 20-25%. Has [employer] ICD.  NYHA III . REDS VEST 48  Volume a little better but still with 10 pounds. Stop po lasix and start torsemide 60 mg twice a day.  -Continue 25 mg  spiro, and dig 0.0625 mg.  -No BB with recent cardiogenic shock.  -Continue bidil 1 tab tid.   No ace for now with soft SBP.  Jacob is not a candidate for advanced therapies given social situation.  BMET and BNP today.  2. Chronic  A fib -  Rate controlled. Continue Amio 200 mg twice a day,  Xarelto. No bleeding problems.  3. DMII- Per PCP  4. H/O GI Bleed - duodenal ulcer with clip 9/5 5. PAD- Last ABI 2015 . R EIA stent with known bilateral SFA occlusion.  Repeat ABI ok. Dr Allyson Sabal recommends yearly ABI.   5. Homelessness-- now has low income housing.  6. Smoking- Current smoker 1/2-1 PPD. Counseled on smoking cessation.   Follow in 2 weeks. Jacob is at high risk for readmission due to poor insight. Paramedicine to evaluate Monday.   Amy Clegg NP-C  10:38 AM

## 2015-04-28 NOTE — Patient Instructions (Signed)
Stop Lasix.  Start Torsemide 60mg  (3 tablets) twice daily.  Follow up: 2 weeks with Amy Filbert Schilder

## 2015-04-28 NOTE — Telephone Encounter (Signed)
This Case Manager placed call to patient to check on status. Patient denied any concerns and denied shortness of breath. [facility] and 04/28/15. Patient aware he is to stop taking Lasix and begin taking Torsemide 60 mg twice daily per Tonye Becket, NP. He indicated he has already picked up Torsemide from Pharmacy. Inquired if patient wanted to reschedule appointment with Dr. Venetia Night since patient missed appointment this week. Patient declined and indicated he spoke with Pharmacist at [facility] on 04/27/15, and he now knows indications and importance of each of his medications. Discussed importance of compliance. Patient verbalized understanding. Patient reminded of appointment on 05/19/15 at 1400 with Dr. Venetia Night. No additional needs/concerns identified.

## 2015-05-01 ENCOUNTER — Other Ambulatory Visit (HOSPITAL_COMMUNITY): Payer: Self-pay | Admitting: *Deleted

## 2015-05-01 ENCOUNTER — Telehealth (HOSPITAL_COMMUNITY): Payer: Self-pay | Admitting: *Deleted

## 2015-05-01 ENCOUNTER — Telehealth: Payer: Self-pay | Admitting: Licensed Clinical Social Worker

## 2015-05-01 MED ORDER — POTASSIUM CHLORIDE CRYS ER 20 MEQ PO TBCR: 10.0000 meq | EXTENDED_RELEASE_TABLET | Freq: Every day | ORAL | Status: DC

## 2015-05-01 NOTE — Telephone Encounter (Signed)
Jacob [name] called to report pt's wt is down to 221 lb today, she reviewed meds and they are correct, pt is on Torsemide now, not Lasix and seems to be taking everything correctly.  She will f/u with pt again on Thur.

## 2015-05-01 NOTE — Telephone Encounter (Signed)
Pt scheduled for 2:45pm on 2/17 with Dr. Allyson Sabal.

## 2015-05-01 NOTE — Telephone Encounter (Signed)
CSW referred to assist with lost medicare and medicaid card. Patient reports he lost his wallet and needs to replace both cards. CSW provided patient with contact numbers for replacement cards and patient verbalizes understanding of follow up. CSW will continue to be available as needed. Lasandra Beech, LCSW [phone]

## 2015-05-02 ENCOUNTER — Telehealth: Payer: Self-pay

## 2015-05-02 NOTE — Telephone Encounter (Signed)
Call received from [facility], CM with [facility]. She stated that she received the patient's request for furniture and was sending it to [employer]. She noted that has requested a a couch, dresser, table/chairs and a bedside table. She also noted that it may be 2-3 months before [facility] can fill the request but she wanted to confirm that the [facility] could assist with moving the furniture. Informed her that this CM would check again with Charlett Lango, [facility] Practice Manager.   This CM spoke to J. Delora Fuel, who confirmed that the [facility] could assist with the furniture delivery. She also noted that the [employer] has a Facilities manager .   Call placed to Ms Aurther Loft # [phone] to inform her that the [facility] can assist with the furniture delivery. Voice mail message left requesting a call back to # [phone].

## 2015-05-03 ENCOUNTER — Telehealth: Payer: Self-pay

## 2015-05-03 NOTE — Telephone Encounter (Signed)
Call placed to Morton Peters, CM [facility] to inform her that as per Charlett Lango, [facility] Practice Manager, the [facility] will assist the patient with furniture delivery. Also informed her that the [employer] has a Pharmacist, community for $50/delivery.

## 2015-05-05 ENCOUNTER — Other Ambulatory Visit: Payer: Self-pay | Admitting: Family Medicine

## 2015-05-05 DIAGNOSIS — R0602 Shortness of breath: Secondary | ICD-10-CM

## 2015-05-05 DIAGNOSIS — R0603 Acute respiratory distress: Secondary | ICD-10-CM

## 2015-05-05 NOTE — Progress Notes (Signed)
Prescription for nebulizer (with Provider NPI and diagnosis code), along with required documentation faxed to [facility] (Fax# [phone]).

## 2015-05-11 ENCOUNTER — Encounter (HOSPITAL_COMMUNITY): Payer: Medicare Other

## 2015-05-12 ENCOUNTER — Ambulatory Visit (INDEPENDENT_AMBULATORY_CARE_PROVIDER_SITE_OTHER): Payer: Medicare Other | Admitting: Cardiovascular Disease

## 2015-05-12 ENCOUNTER — Ambulatory Visit: Payer: Medicare Other | Admitting: Cardiovascular Disease

## 2015-05-12 ENCOUNTER — Telehealth: Payer: Self-pay | Admitting: Cardiovascular Disease

## 2015-05-12 ENCOUNTER — Encounter: Payer: Self-pay | Admitting: Cardiovascular Disease

## 2015-05-12 VITALS — BP 126/80 | HR 121 | Ht 66.0 in | Wt 230.0 lb

## 2015-05-12 DIAGNOSIS — I255 Ischemic cardiomyopathy: Secondary | ICD-10-CM | POA: Diagnosis not present

## 2015-05-12 DIAGNOSIS — I739 Peripheral vascular disease, unspecified: Secondary | ICD-10-CM

## 2015-05-12 NOTE — Telephone Encounter (Signed)
Pt states he can make appt today - this had been cancelled & he'd been given a 3/28 appt as next available.  Pt wanted to come today as he had gotten in touch with a driver. I looked, there was still opening for 2:45 today. Pt added to schedule.

## 2015-05-12 NOTE — Telephone Encounter (Signed)
New message    Patient had to his appt today due to transportation issues . R.s date is  3/28 . Patient is asking for the nurse to call him back

## 2015-05-12 NOTE — Patient Instructions (Signed)
Medication Instructions:  Your physician recommends that you continue on your current medications as directed. Please refer to the Current Medication list given to you today.   Labwork: none  Testing/Procedures: none  Follow-Up: Follow up with Dr. Berry as needed.   Any Other Special Instructions Will Be Listed Below (If Applicable).     If you need a refill on your cardiac medications before your next appointment, please call your pharmacy.   

## 2015-05-12 NOTE — Progress Notes (Signed)
05/12/2015 Jacob [name]   [birth year]  [id number]  Primary Physician [name], MD Primary Cardiologist: Runell Gess MD [name]   HPI:  Mr. Jacob [name] is a 60 year old moderately overweight [place] male father of 3 children referred by Dr. Sharrell Ku for evaluation of claudication. I last saw him in the office 11/15/13.He is a history of coronary artery disease status post bypass grafting x2 at [facility] April 2013. 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 Ladona Ridgel. The problems include 100-pack-year history of tobacco abuse currently trying to stop smoking at least one pack per day, hypertension, hyperlipidemia and diabetes. He does have chronic A. Fib on oral anticoagulation. He complains of occasional chest pain as well as dyspnea. Over the last year has developed right greater than left lungs" at less than 50 feet with Dopplers that showed ABIs of 0.6 range biphasic popliteal waveforms. I intubated him on 08/30/13 revealed a high-grade right external iliac artery stenosis which was stented.. In addition to his iliac disease he had chronically occluded SFAs bilaterally with 2 vessel runoff which I thought could not be treated with endovascular therapy and would require femoropopliteal bypass grafting. I felt he was too high risk to have these procedures done. He continues to have moderate to severe lifestyle limiting claudication. He was hospitalized back in September with cardiogenic shock requiring multiple pressors. He does continue to smoke.  Current Outpatient Prescriptions  Medication Sig Dispense Refill  . albuterol (PROVENTIL HFA;VENTOLIN HFA) 108 (90 Base) MCG/ACT inhaler Inhale 2 puffs into the lungs every 6 (six) hours as needed for wheezing or shortness of breath. 1 Inhaler 2  . albuterol (PROVENTIL) (2.5 MG/3ML) 0.083% nebulizer solution Take 3 mLs (2.5 mg total) by nebulization every 6 (six) hours as  needed for wheezing or shortness of breath. 150 mL 1  . amiodarone (PACERONE) 200 MG tablet Take 1 tablet (200 mg total) by mouth 2 (two) times daily. 60 tablet 3  . atorvastatin (LIPITOR) 40 MG tablet Take 1 tablet (40 mg total) by mouth daily at 6 PM. 30 tablet 2  . Blood Glucose Monitoring Suppl (ACCU-CHEK AVIVA) device Use as instructed 3 times daily before meals 1 each 0  . carvedilol (COREG) 3.125 MG tablet Take 1 tablet (3.125 mg total) by mouth 2 (two) times daily with a meal. 60 tablet 2  . digoxin (LANOXIN) 0.125 MG tablet Take 0.0625 mg by mouth daily.    . ferrous sulfate 325 (65 FE) MG EC tablet Take 325 mg by mouth 2 (two) times daily.    . Fluticasone-Salmeterol (ADVAIR) 100-50 MCG/DOSE AEPB Inhale 1 puff into the lungs daily.    Marland Kitchen glipiZIDE (GLUCOTROL) 10 MG tablet Take 1 tablet (10 mg total) by mouth 2 (two) times daily before a meal. 60 tablet 2  . isosorbide-hydrALAZINE (BIDIL) 20-37.5 MG tablet Take 1 tablet by mouth 2 (two) times daily.    . pantoprazole (PROTONIX) 40 MG tablet Take 1 tablet (40 mg total) by mouth 2 (two) times daily. 60 tablet 2  . potassium chloride (K-DUR,KLOR-CON) 20 MEQ tablet Take 0.5 tablets (10 mEq total) by mouth daily. 30 tablet 3  . rivaroxaban (XARELTO) 20 MG TABS tablet Take 1 tablet (20 mg total) by mouth daily before supper. 30 tablet 2  . spironolactone (ALDACTONE) 25 MG tablet Take 1 tablet (25 mg total) by mouth daily. 30 tablet 2  . torsemide (DEMADEX) 20 MG tablet Take 3 tablets (  60 mg total) by mouth 2 (two) times daily. 180 tablet 3  . traMADol (ULTRAM) 50 MG tablet Take 1 tablet (50 mg total) by mouth every 8 (eight) hours as needed for moderate pain. 60 tablet 1   No current facility-administered medications for this visit.    Allergies  Allergen Reactions  . Tape Itching    Paper tape please.    Social History   Social History  . Marital Status: Divorced    Spouse Name: N/A  . Number of Children: 3  . Years of Education:  N/A   Occupational History  . Unemployed    Social History Main Topics  . Smoking status: Current Every Day Smoker -- 0.50 packs/day for 40 years    Types: Cigarettes  . Smokeless tobacco: Never Used  . Alcohol Use: No  . Drug Use: No  . Sexual Activity: No   Other Topics Concern  . Not on file   Social History Narrative   Has an apartment with a roommate. He was living on the streets in 02/13/2013.  He reports that his father died in [place] in 13-Feb-2013.  He is divorced.  He is no longer estranged from his son, but still from his daughter who lives locally.  Neither of his parents, nor any siblings have any history of CAD.     Review of Systems: General: negative for chills, fever, night sweats or weight changes.  Cardiovascular: negative for chest pain, dyspnea on exertion, edema, orthopnea, palpitations, paroxysmal nocturnal dyspnea or shortness of breath Dermatological: negative for rash Respiratory: negative for cough or wheezing Urologic: negative for hematuria Abdominal: negative for nausea, vomiting, diarrhea, bright red blood per rectum, melena, or hematemesis Neurologic: negative for visual changes, syncope, or dizziness All other systems reviewed and are otherwise negative except as noted above.    Blood pressure 126/80, pulse 121, height  (1.676 m), weight 230 lb (104.327 kg).  General appearance: alert and no distress Neck: no adenopathy, no carotid bruit, no JVD, supple, symmetrical, trachea midline and thyroid not enlarged, symmetric, no tenderness/mass/nodules Lungs: clear to auscultation bilaterally Heart: regular rate and rhythm, S1, S2 normal, no murmur, click, rub or gallop Abdomen: soft, non-tender; bowel sounds normal; no masses,  no organomegaly  EKG not performed today  ASSESSMENT AND PLAN:   PVD (peripheral vascular disease) History of peripheral vascular disease with lifestyle limiting claudication status post right external iliac artery  stenting by myself 08/30/13 with known occluded SFAs bilaterally and 2 vessel runoff. His recent Dopplers showed ABIs that were stable and iliac stent that was patent. He does complain of moderate to severe claudication. He is not a candidate for endovascular therapy of his SFAs and would require femoropopliteal bypass grafting which I think he would be high risk for given his cardiac situation.      Runell Gess MD FACP,FACC,FAHA, [facility] 05/12/2015 3:22 PM

## 2015-05-12 NOTE — Assessment & Plan Note (Signed)
History of peripheral vascular disease with lifestyle limiting claudication status post right external iliac artery stenting by myself 08/30/13 with known occluded SFAs bilaterally and 2 vessel runoff. His recent Dopplers showed ABIs that were stable and iliac stent that was patent. He does complain of moderate to severe claudication. He is not a candidate for endovascular therapy of his SFAs and would require femoropopliteal bypass grafting which I think he would be high risk for given his cardiac situation.

## 2015-05-16 ENCOUNTER — Ambulatory Visit: Payer: Medicare Other | Admitting: Cardiovascular Disease

## 2015-05-17 ENCOUNTER — Ambulatory Visit (HOSPITAL_COMMUNITY)
Admission: RE | Admit: 2015-05-17 | Discharge: 2015-05-17 | Disposition: A | Discharge status: Home / Self Care | Payer: Medicare Other | Source: Ambulatory Visit | Attending: Cardiology | Admitting: Cardiology

## 2015-05-17 ENCOUNTER — Encounter (HOSPITAL_COMMUNITY): Payer: Self-pay

## 2015-05-17 VITALS — BP 102/64 | HR 112 | Wt 231.4 lb

## 2015-05-17 DIAGNOSIS — I110 Hypertensive heart disease with heart failure: Secondary | ICD-10-CM | POA: Insufficient documentation

## 2015-05-17 DIAGNOSIS — Z9119 Patient's noncompliance with other medical treatment and regimen: Secondary | ICD-10-CM | POA: Diagnosis not present

## 2015-05-17 DIAGNOSIS — I255 Ischemic cardiomyopathy: Secondary | ICD-10-CM | POA: Diagnosis not present

## 2015-05-17 DIAGNOSIS — I482 Chronic atrial fibrillation: Secondary | ICD-10-CM | POA: Insufficient documentation

## 2015-05-17 DIAGNOSIS — E7800 Pure hypercholesterolemia, unspecified: Secondary | ICD-10-CM | POA: Insufficient documentation

## 2015-05-17 DIAGNOSIS — Z91199 Patient's noncompliance with other medical treatment and regimen due to unspecified reason: Secondary | ICD-10-CM

## 2015-05-17 DIAGNOSIS — E119 Type 2 diabetes mellitus without complications: Secondary | ICD-10-CM | POA: Insufficient documentation

## 2015-05-17 DIAGNOSIS — Z95810 Presence of automatic (implantable) cardiac defibrillator: Secondary | ICD-10-CM | POA: Diagnosis not present

## 2015-05-17 DIAGNOSIS — I252 Old myocardial infarction: Secondary | ICD-10-CM | POA: Insufficient documentation

## 2015-05-17 DIAGNOSIS — I5023 Acute on chronic systolic (congestive) heart failure: Secondary | ICD-10-CM | POA: Diagnosis not present

## 2015-05-17 DIAGNOSIS — I10 Essential (primary) hypertension: Secondary | ICD-10-CM | POA: Diagnosis not present

## 2015-05-17 DIAGNOSIS — Z638 Other specified problems related to primary support group: Secondary | ICD-10-CM | POA: Insufficient documentation

## 2015-05-17 DIAGNOSIS — Z79899 Other long term (current) drug therapy: Secondary | ICD-10-CM | POA: Diagnosis not present

## 2015-05-17 DIAGNOSIS — Z7984 Long term (current) use of oral hypoglycemic drugs: Secondary | ICD-10-CM | POA: Insufficient documentation

## 2015-05-17 DIAGNOSIS — I2510 Atherosclerotic heart disease of native coronary artery without angina pectoris: Secondary | ICD-10-CM | POA: Insufficient documentation

## 2015-05-17 DIAGNOSIS — I739 Peripheral vascular disease, unspecified: Secondary | ICD-10-CM | POA: Insufficient documentation

## 2015-05-17 DIAGNOSIS — F17210 Nicotine dependence, cigarettes, uncomplicated: Secondary | ICD-10-CM | POA: Insufficient documentation

## 2015-05-17 DIAGNOSIS — Z590 Homelessness: Secondary | ICD-10-CM | POA: Insufficient documentation

## 2015-05-17 DIAGNOSIS — Z833 Family history of diabetes mellitus: Secondary | ICD-10-CM | POA: Diagnosis not present

## 2015-05-17 DIAGNOSIS — Z7902 Long term (current) use of antithrombotics/antiplatelets: Secondary | ICD-10-CM | POA: Diagnosis not present

## 2015-05-17 LAB — BASIC METABOLIC PANEL
Anion gap: 13 (ref 5–15)
BUN: 25 mg/dL — AB (ref 6–20)
CHLORIDE: 97 mmol/L — AB (ref 101–111)
CO2: 28 mmol/L (ref 22–32)
Calcium: 8.9 mg/dL (ref 8.9–10.3)
Creatinine, Ser: 1.23 mg/dL (ref 0.61–1.24)
GFR calc Af Amer: >60 mL/min (ref >60)
GFR calc non Af Amer: >60 mL/min (ref >60)
Glucose, Bld: 247 mg/dL — ABNORMAL HIGH (ref 65–99)
POTASSIUM: 3.9 mmol/L (ref 3.5–5.1)
SODIUM: 138 mmol/L (ref 135–145)

## 2015-05-17 LAB — BRAIN NATRIURETIC PEPTIDE: B NATRIURETIC PEPTIDE 5: 777.8 pg/mL — AB (ref 0.0–100.0)

## 2015-05-17 MED ORDER — METOLAZONE 5 MG PO TABS
5.0000 mg | ORAL_TABLET | ORAL | Status: DC
Start: 2015-05-17 — End: 2015-08-31

## 2015-05-17 MED ORDER — TORSEMIDE 20 MG PO TABS: 80.0000 mg | ORAL_TABLET | Freq: Two times a day (BID) | ORAL | Status: DC

## 2015-05-17 MED FILL — metOLazone 5 MG TABS: 5 | 10 days supply | Qty: 10 | Fill #0

## 2015-05-17 NOTE — Patient Instructions (Signed)
Labs today  INCREASE Torsemide to 80 mg (4 tabs) twice a day START Metolazone 5 mg, one tab for the next two days   Your physician recommends that you schedule a follow-up appointment in: 1 week  Do the following things EVERYDAY: 1) Weigh yourself in the morning before breakfast. Write it down and keep it in a log. 2) Take your medicines as prescribed 3) Eat low salt foods-Limit salt (sodium) to 2000 mg per day.  4) Stay as active as you can everyday 5) Limit all fluids for the day to less than 2 liters 6)

## 2015-05-17 NOTE — Progress Notes (Signed)
Patient ID: Jacob [name], male   DOB: 10/22/1955, 60 y.o.   MRN: [id number]    Advanced Heart Failure Clinic Note   PCP: None  Primary HF Cardiologist: Dr Gala Romney   HPI: [name] is a 60 year old [place] male with history of systolic HF EF 20-25% via Echo 08/03/14 s/p INCEPTA ICD implant 6/14, RV dysfunction, CAD s/p CABG x 2 w/ RF MAZE 7/14 at [facility], DM type II, Chronic afib on xarelto, GI bleed 11/28/2014, and HLD.   Admitted the end of August 2016  with increased dyspnea on exertion. Later found to be in cardiogenic shock . At one point on dual pressors milrinone and norepi. Diuresed with IV lasix and transitioned to po lasix. Hospital course complicated by GI bleed and A fib RVR. Loaded on amio but later placed on toprol xl for rate control. On 9/5 had EGD with duodenal bleed with clip applied. He was discharged 9/12 with D/C weight 203 pounds.   Admitted late December 2016 with increased dyspnea. Diuresed with IV lasix and transitioned to po lasix 80 mg twice a day. Chronic A fib and continued on amio 200 mg twice a day. Discharge weight was 205 pounds.   He returns today for add on for increased SOB and "not peeing as much on torsemide".  States he usually has to run to the bathroom. Still peeing, just not as often.  He is up 5 lbs since last visit, and was thought to be volume overloaded at that visit. Denies Orthopnea.  Says he can walk 30-45 feet prior to getting SOB.  + Bendopnea and orthopnea. Not adding salt or eating fast food. Had salad and grilled chicken for dinner last night. Says he is regularly drinking > 2L of fluid. States " I just like water". Continues to smoke ~ 1/2 ppd. Denies ETOH. Taking all medications.   SH: Lives in low income housing. Estranged from family   ROS: All systems negative except as listed in HPI, PMH and Problem List.  SH:  Social History   Social History  . Marital Status: Divorced    Spouse Name: N/A  . Number of Children: 3  . Years of  Education: N/A   Occupational History  . Unemployed    Social History Main Topics  . Smoking status: Current Every Day Smoker -- 0.50 packs/day for 40 years    Types: Cigarettes  . Smokeless tobacco: Never Used  . Alcohol Use: No  . Drug Use: No  . Sexual Activity: No   Other Topics Concern  . Not on file   Social History Narrative   Has an apartment with a roommate. He was living on the streets in January 28, 2013.  He reports that his father died in [place] in January 28, 2013.  He is divorced.  He is no longer estranged from his son, but still from his daughter who lives locally.  Neither of his parents, nor any siblings have any history of CAD.    FH:  Family History  Problem Relation Age of Onset  . Diabetes Mother     Past Medical History  Diagnosis Date  . Hypertension   . Noncompliance     homelessness contributing.   Marland Kitchen CAD (coronary artery disease) Sept 2013    s/p cardiac cath showing occlusion of small RCA with collaterals  . Chronic anticoagulation     on xarelto.   . Atrial fibrillation (HCC)     RVR 10/2014  . Peripheral arterial disease (HCC)   .  Automatic implantable cardioverter-defibrillator in situ   . High cholesterol   . Myocardial infarction (HCC) 2014  . CHF (congestive heart failure) (HCC)     20 to 25 % EF and RV dysfunction by 07/2014 echo   . Type II diabetes mellitus (HCC)     Current Outpatient Prescriptions  Medication Sig Dispense Refill  . albuterol (PROVENTIL HFA;VENTOLIN HFA) 108 (90 Base) MCG/ACT inhaler Inhale 2 puffs into the lungs every 6 (six) hours as needed for wheezing or shortness of breath. 1 Inhaler 2  . albuterol (PROVENTIL) (2.5 MG/3ML) 0.083% nebulizer solution Take 3 mLs (2.5 mg total) by nebulization every 6 (six) hours as needed for wheezing or shortness of breath. 150 mL 1  . amiodarone (PACERONE) 200 MG tablet Take 1 tablet (200 mg total) by mouth 2 (two) times daily. 60 tablet 3  . atorvastatin (LIPITOR) 40 MG tablet Take 1  tablet (40 mg total) by mouth daily at 6 PM. 30 tablet 2  . Blood Glucose Monitoring Suppl (ACCU-CHEK AVIVA) device Use as instructed 3 times daily before meals 1 each 0  . carvedilol (COREG) 3.125 MG tablet Take 1 tablet (3.125 mg total) by mouth 2 (two) times daily with a meal. 60 tablet 2  . digoxin (LANOXIN) 0.125 MG tablet Take 0.0625 mg by mouth daily.    . ferrous sulfate 325 (65 FE) MG EC tablet Take 325 mg by mouth 2 (two) times daily.    . Fluticasone-Salmeterol (ADVAIR) 100-50 MCG/DOSE AEPB Inhale 1 puff into the lungs daily.    Marland Kitchen glipiZIDE (GLUCOTROL) 10 MG tablet Take 1 tablet (10 mg total) by mouth 2 (two) times daily before a meal. 60 tablet 2  . isosorbide-hydrALAZINE (BIDIL) 20-37.5 MG tablet Take 1 tablet by mouth 2 (two) times daily.    . pantoprazole (PROTONIX) 40 MG tablet Take 1 tablet (40 mg total) by mouth 2 (two) times daily. 60 tablet 2  . potassium chloride (K-DUR,KLOR-CON) 20 MEQ tablet Take 0.5 tablets (10 mEq total) by mouth daily. 30 tablet 3  . rivaroxaban (XARELTO) 20 MG TABS tablet Take 1 tablet (20 mg total) by mouth daily before supper. 30 tablet 2  . spironolactone (ALDACTONE) 25 MG tablet Take 1 tablet (25 mg total) by mouth daily. 30 tablet 2  . torsemide (DEMADEX) 20 MG tablet Take 4 tablets (80 mg total) by mouth 2 (two) times daily. 240 tablet 3  . traMADol (ULTRAM) 50 MG tablet Take 1 tablet (50 mg total) by mouth every 8 (eight) hours as needed for moderate pain. 60 tablet 1  . metolazone (ZAROXOLYN) 5 MG tablet Take 1 tablet (5 mg total) by mouth as directed. 10 tablet 0   No current facility-administered medications for this encounter.    Filed Vitals:   05/17/15 1443  BP: 102/64  Pulse: 112  Weight: 231 lb 6.4 oz (104.962 kg)  SpO2: 94%  REDS VEST READING 47  Wt Readings from Last 3 Encounters:  05/17/15 231 lb 6.4 oz (104.962 kg)  05/12/15 230 lb (104.327 kg)  04/28/15 226 lb (102.513 kg)      PHYSICAL EXAM: General:  Chronically ill  appearing. No resp difficulty. Arrived in a wheelchair HEENT: normal Neck: supple. JVP elevated to jaw. Carotids 2+ bilaterally; no bruits. No thyromegaly or nodule noted. Cor: PMI normal. Irregular rate & rhythm, slightly tachy. No rubs, gallops or murmurs. Lungs: Mild basilar crackles Abdomen: soft, NT, ND, no HSM. No bruits or masses. +BS  Extremities: no cyanosis, clubbing, rash,  R and LLE no edema.  Neuro: alert & orientedx3, cranial nerves grossly intact. Moves all 4 extremities w/o difficulty. Affect pleasant.  ASSESSMENT & PLAN: 1. Acute/Chronic Systolic Heart Failure - ICM EF 20-25%. Has [employer] ICD.  NYHA IIIb symptoms REDS VEST 47. - He remains markedly volume overloaded despite switch to torsemide from lasix several weeks ago. Had around 15 lbs of volume on board. BMET and BNP today.  - Increase torsemide to 80 mg BID.   - Will give dose of metolazone 5 mg x 2 days, He is not sure if he has any at home.  Will send prescription for 10 tabs. Stressed importance of only taking as directed. [name] with paramedicine to follow up in home tomorrow. Needs to come back for visit next week. If remains volume overloaded will give IV lasix in clinic.   - Continue 25 mg  spiro, and dig 0.0625 mg.  - No BB with recent cardiogenic shock.  - Continue bidil 1 tab tid.  - No ace for now with soft SBP.  - He is not a candidate for advanced therapies given social situation.  BMET and BNP today.  2. Chronic A fib -  Rate controlled. Continue Amio 200 mg twice a day,  Xarelto. No bleeding problems.  3. DMII- Per PCP  4. H/O GI Bleed - duodenal ulcer with clip 9/5 5. PAD- Last ABI 2015 . R EIA stent with known bilateral SFA occlusion.  Repeat ABI ok. Dr Allyson Sabal recommends yearly ABI.   5. Homelessness - Currently resides in low income housing.  6. Smoking- Current smoker 1/2-1 PPD. Counseled on smoking cessation.   Med changes as above. BMET/BNP today. Will repeat BMET next week at follow  up. He is at high risk for readmission due to poor insight. Paramedicine following. [name] with Paramedicine to follow up with him tomorrow.   Mariam Dollar Tillery PA-C  3:32 PM

## 2015-05-17 NOTE — Progress Notes (Signed)
Advanced Heart Failure Medication Review by a Pharmacist  Does the patient  feel that his/her medications are working for him/her?  yes  Has the patient been experiencing any side effects to the medications prescribed?  no  Does the patient measure his/her own blood pressure or blood glucose at home?  no   Does the patient have any problems obtaining medications due to transportation or finances?   no  Understanding of regimen: good Understanding of indications: good Potential of compliance: fair Patient understands to avoid NSAIDs. Patient understands to avoid decongestants.  Issues to address at subsequent visits: Compliance   Pharmacist comments:  Jacob [name] is a pleasant 60 yo M presenting with his current medication list. He reports good compliance with his medications and [name] with paramedicine sees him regularly. He states that he is not urinating as well with torsemide. He did not have any other medication-related questions or concerns at this time.   [name]. Bonnye Fava, PharmD, BCPS, CPP Clinical Pharmacist Pager: [phone] Phone: [phone] 05/17/2015 3:09 PM      Time with patient: 10 minutes Preparation and documentation time: 8 minutes Total time: 18 minutes

## 2015-05-17 NOTE — Progress Notes (Signed)
REDS VEST READING= 47 CHEST RULER=10  VEST FITTING TASKS: POSTURE=standing HEIGHT MARKER=tall CENTER STRIP=shifted

## 2015-05-18 ENCOUNTER — Telehealth: Payer: Self-pay

## 2015-05-18 ENCOUNTER — Encounter: Payer: Self-pay | Admitting: Internal Medicine

## 2015-05-18 NOTE — Telephone Encounter (Signed)
This Case Manager placed call to patient to check on status and to remind him of upcoming appointment on 05/19/15 at 1400 with Dr. Venetia Night. Patient indicated he was "doing good now." He denied shortness of breath or swelling in lower extremities. Patient indicated he planned to be at his appointment. Reminded patient to bring all medications to his appointment, and patient verbalized understanding. No additional needs/concerns identified.

## 2015-05-19 ENCOUNTER — Ambulatory Visit: Payer: Medicare Other | Admitting: Family Medicine

## 2015-05-24 ENCOUNTER — Inpatient Hospital Stay (HOSPITAL_COMMUNITY): Admission: RE | Admit: 2015-05-24 | Payer: Medicare Other | Source: Ambulatory Visit

## 2015-05-24 ENCOUNTER — Telehealth (HOSPITAL_COMMUNITY): Payer: Self-pay

## 2015-05-24 NOTE — Telephone Encounter (Signed)
[name] with CHF Paramedicine Program called to report that patient had increased HR of 108-116 yesterday afternoon during her visit. States patient was not SOB, had no swelling, stable weight, BOP 108/68, and otherwise felt fine. Will let provider know as he is being seen today in CHF clinic.  Ave Filter

## 2015-05-31 ENCOUNTER — Other Ambulatory Visit (HOSPITAL_COMMUNITY): Payer: Self-pay | Admitting: *Deleted

## 2015-05-31 ENCOUNTER — Telehealth (HOSPITAL_COMMUNITY): Payer: Self-pay | Admitting: *Deleted

## 2015-05-31 DIAGNOSIS — I255 Ischemic cardiomyopathy: Secondary | ICD-10-CM

## 2015-05-31 MED ORDER — POTASSIUM CHLORIDE CRYS ER 10 MEQ PO TBCR: 10.0000 meq | EXTENDED_RELEASE_TABLET | Freq: Every day | ORAL | Status: DC

## 2015-05-31 NOTE — Telephone Encounter (Signed)
[name] with paramedicine called pts heart rate is in the 130s and his bp is 160/110. No pain, weight is good, no shortness of breath. He is scheduled for an appt next week. Any changes? Please advise.   [facility] callback # [phone]

## 2015-05-31 NOTE — Telephone Encounter (Signed)
Spoke with Jacob [name] again and was informed patient had not taken hs evening medications. [name] filled his pill tray again and educated patient on how he should be taking his medications. Pt will follow up in office next week.

## 2015-06-07 ENCOUNTER — Encounter (HOSPITAL_COMMUNITY): Payer: Medicare Other

## 2015-06-13 ENCOUNTER — Inpatient Hospital Stay (HOSPITAL_COMMUNITY): Admission: RE | Admit: 2015-06-13 | Payer: Medicare Other | Source: Ambulatory Visit

## 2015-06-20 ENCOUNTER — Ambulatory Visit: Payer: Medicare Other | Admitting: Cardiovascular Disease

## 2015-06-27 ENCOUNTER — Encounter (HOSPITAL_COMMUNITY): Payer: Medicare Other

## 2015-06-29 ENCOUNTER — Inpatient Hospital Stay (HOSPITAL_COMMUNITY): Admission: RE | Admit: 2015-06-29 | Payer: Medicare Other | Source: Ambulatory Visit

## 2015-06-30 ENCOUNTER — Telehealth: Payer: Self-pay

## 2015-06-30 ENCOUNTER — Telehealth: Payer: Self-pay | Admitting: Family Medicine

## 2015-06-30 NOTE — Telephone Encounter (Signed)
Anne from [facility] called to let pt. PCP know that due to pt. Living situation they have not been able to do the tele monitoring.

## 2015-06-30 NOTE — Telephone Encounter (Signed)
This Case Manager received communication that Demetra Shiner from [employer] called regarding patient's tele-monitoring. This Case Manager placed return call to [employer]. She indicated she received tele-monitoring orders in February 2017; however, [facility] was unable to initiate tele-monitoring at that time because patient did not have furniture in his apartment. She indicated patient has now moved out of that apartment and is temporarily living with someone else so once again they were unable to begin tele-monitoring services. Demetra Shiner indicated she wanted to provide update to patient's PCP. Will route to Dr. Venetia Night so she is aware. No additional needs/concerns identified.

## 2015-07-11 ENCOUNTER — Telehealth (HOSPITAL_COMMUNITY): Payer: Self-pay | Admitting: Surgery

## 2015-07-11 NOTE — Telephone Encounter (Signed)
I received a call from [facility] the Paramedic concerning Jacob [name] medications.  He has been taking the wrong dose of Digoxin as well as all other medications were mixed up in his med box.  Florentina Addison has fixed all medications and I have scheduled an appointment for labs tomorrow as well as follow-up appt for next week.

## 2015-07-12 ENCOUNTER — Other Ambulatory Visit (HOSPITAL_COMMUNITY): Payer: Medicare Other

## 2015-07-13 ENCOUNTER — Telehealth: Payer: Self-pay

## 2015-07-13 NOTE — Telephone Encounter (Signed)
Pt. Called requesting to speak to his PCP regarding paperwork that was giving to her. Pt. Stated he needs the paperwork today. Please f/u with pt.

## 2015-07-13 NOTE — Telephone Encounter (Signed)
This Case Manager received voicemail from patient requesting return call. Return call placed. Patient indicated his Case Worker faxed a form to [employer] on 07/11/15 that needs to be signed by his PCP. Patient indicated his Case Worker needs completed form to assist him in getting an apartment. Information routed to Dr. Venetia Night.

## 2015-07-14 NOTE — Telephone Encounter (Signed)
Forwarded information to [name], case manager for evaluation.

## 2015-07-17 ENCOUNTER — Telehealth: Payer: Self-pay

## 2015-07-17 NOTE — Telephone Encounter (Signed)
Signed Live in Attendant service form faxed to [facility]  - Attn Tania Ade. Fax # [phone].

## 2015-07-19 ENCOUNTER — Telehealth (HOSPITAL_COMMUNITY): Payer: Self-pay | Admitting: Vascular Surgery

## 2015-07-19 ENCOUNTER — Encounter (HOSPITAL_COMMUNITY): Payer: Medicare Other

## 2015-07-19 NOTE — Telephone Encounter (Signed)
Pt call to cancel today's appt, returned pt call ,pt did not answer left message to call to resch

## 2015-08-08 MED FILL — PANTOPRAZOLE SOD DR 40 MG T: 40 | 30 days supply | Qty: 60 | Fill #1

## 2015-08-08 MED FILL — ATORVASTATIN 40 MG TABLET: 40 | 30 days supply | Qty: 30 | Fill #1

## 2015-08-08 MED FILL — DIGOXIN 125 MCG TABLET: 125 | 30 days supply | Qty: 15 | Fill #0

## 2015-08-08 MED FILL — TORSEMIDE 20 MG TABLET: 20 | 30 days supply | Qty: 180 | Fill #0

## 2015-08-15 ENCOUNTER — Ambulatory Visit (HOSPITAL_COMMUNITY)
Admission: RE | Admit: 2015-08-15 | Discharge: 2015-08-15 | Disposition: A | Discharge status: Home / Self Care | Payer: Medicare Other | Source: Ambulatory Visit | Attending: Adult Health | Admitting: Adult Health

## 2015-08-15 ENCOUNTER — Encounter (HOSPITAL_COMMUNITY): Payer: Self-pay

## 2015-08-15 ENCOUNTER — Encounter: Payer: Self-pay | Admitting: Licensed Clinical Social Worker

## 2015-08-15 VITALS — BP 120/82 | HR 117 | Wt 226.0 lb

## 2015-08-15 DIAGNOSIS — Z7901 Long term (current) use of anticoagulants: Secondary | ICD-10-CM | POA: Insufficient documentation

## 2015-08-15 DIAGNOSIS — I482 Chronic atrial fibrillation, unspecified: Secondary | ICD-10-CM

## 2015-08-15 DIAGNOSIS — Z95810 Presence of automatic (implantable) cardiac defibrillator: Secondary | ICD-10-CM | POA: Diagnosis not present

## 2015-08-15 DIAGNOSIS — E119 Type 2 diabetes mellitus without complications: Secondary | ICD-10-CM | POA: Diagnosis not present

## 2015-08-15 DIAGNOSIS — I739 Peripheral vascular disease, unspecified: Secondary | ICD-10-CM | POA: Insufficient documentation

## 2015-08-15 DIAGNOSIS — E8770 Fluid overload, unspecified: Secondary | ICD-10-CM | POA: Diagnosis not present

## 2015-08-15 DIAGNOSIS — F17210 Nicotine dependence, cigarettes, uncomplicated: Secondary | ICD-10-CM | POA: Insufficient documentation

## 2015-08-15 DIAGNOSIS — E7800 Pure hypercholesterolemia, unspecified: Secondary | ICD-10-CM | POA: Diagnosis not present

## 2015-08-15 DIAGNOSIS — I252 Old myocardial infarction: Secondary | ICD-10-CM | POA: Insufficient documentation

## 2015-08-15 DIAGNOSIS — I10 Essential (primary) hypertension: Secondary | ICD-10-CM | POA: Diagnosis not present

## 2015-08-15 DIAGNOSIS — I5023 Acute on chronic systolic (congestive) heart failure: Secondary | ICD-10-CM | POA: Diagnosis present

## 2015-08-15 DIAGNOSIS — I4891 Unspecified atrial fibrillation: Secondary | ICD-10-CM | POA: Diagnosis not present

## 2015-08-15 DIAGNOSIS — Z79899 Other long term (current) drug therapy: Secondary | ICD-10-CM | POA: Diagnosis not present

## 2015-08-15 DIAGNOSIS — Z720 Tobacco use: Secondary | ICD-10-CM

## 2015-08-15 DIAGNOSIS — Z590 Homelessness: Secondary | ICD-10-CM | POA: Diagnosis not present

## 2015-08-15 DIAGNOSIS — I2510 Atherosclerotic heart disease of native coronary artery without angina pectoris: Secondary | ICD-10-CM | POA: Insufficient documentation

## 2015-08-15 LAB — BASIC METABOLIC PANEL
Anion gap: 9 (ref 5–15)
BUN: 24 mg/dL — ABNORMAL HIGH (ref 6–20)
CALCIUM: 8.7 mg/dL — AB (ref 8.9–10.3)
CHLORIDE: 94 mmol/L — AB (ref 101–111)
CO2: 31 mmol/L (ref 22–32)
CREATININE: 1.42 mg/dL — AB (ref 0.61–1.24)
GFR, EST NON AFRICAN AMERICAN: 53 mL/min — AB (ref >60)
Glucose, Bld: 268 mg/dL — ABNORMAL HIGH (ref 65–99)
POTASSIUM: 4.1 mmol/L (ref 3.5–5.1)
SODIUM: 134 mmol/L — AB (ref 135–145)

## 2015-08-15 LAB — CBC
HCT: 42 % (ref 39.0–52.0)
Hemoglobin: 12.3 g/dL — ABNORMAL LOW (ref 13.0–17.0)
MCH: 23.6 pg — ABNORMAL LOW (ref 26.0–34.0)
MCHC: 29.3 g/dL — AB (ref 30.0–36.0)
MCV: 80.6 fL (ref 78.0–100.0)
PLATELETS: 189 10*3/uL (ref 150–400)
RBC: 5.21 MIL/uL (ref 4.22–5.81)
RDW: 21.8 % — AB (ref 11.5–15.5)
WBC: 7.2 10*3/uL (ref 4.0–10.5)

## 2015-08-15 LAB — BRAIN NATRIURETIC PEPTIDE: B NATRIURETIC PEPTIDE 5: 546.6 pg/mL — AB (ref 0.0–100.0)

## 2015-08-15 MED ORDER — FUROSEMIDE 10 MG/ML IJ SOLN
80.0000 mg | Freq: Once | INTRAMUSCULAR | Status: AC
  Administered 2015-08-15: 80 mg via INTRAVENOUS
  Filled 2015-08-15: qty 8

## 2015-08-15 MED ORDER — POTASSIUM CHLORIDE CRYS ER 20 MEQ PO TBCR
20.0000 meq | EXTENDED_RELEASE_TABLET | Freq: Once | ORAL | Status: AC
Start: 2015-08-15 — End: 2015-08-15
  Administered 2015-08-15: 20 meq via ORAL
  Filled 2015-08-15: qty 1

## 2015-08-15 NOTE — Progress Notes (Signed)
22 g IV started in left arm, 80 mg IV Lasix administered along with 20 meq of KCL, pt tolerated well

## 2015-08-15 NOTE — Progress Notes (Signed)
Patient ID: Jacob [name], male   DOB: [birth year], 60 y.o.   MRN: [id number]    Advanced Heart Failure Clinic Note   PCP: None  Primary HF Cardiologist: Dr Haroldine Laws   HPI: Mr Viernes is a 60 year old [place] male with history of systolic HF EF 16-01% via Echo 08/03/14 s/p INCEPTA ICD implant 6/14, RV dysfunction, CAD s/p CABG x 2 w/ RF MAZE 7/14 at [facility], DM type II, Chronic afib on xarelto, GI bleed 11/28/2014, and HLD.   Admitted the end of August 2016  with increased dyspnea on exertion. Later found to be in cardiogenic shock . At one point on dual pressors milrinone and norepi. Diuresed with IV lasix and transitioned to po lasix. Hospital course complicated by GI bleed and A fib RVR. Loaded on amio but later placed on toprol xl for rate control. On 9/5 had EGD with duodenal bleed with clip applied. He was discharged 9/12 with D/C weight 203 pounds.   Admitted late December 2016 with increased dyspnea. Diuresed with IV lasix and transitioned to po lasix 80 mg twice a day. Chronic A fib and continued on amio 200 mg twice a day. Discharge weight was 205 pounds.   He returns today for HF follow up. Has missed several appointments. Last seen February of this year. Complaining of fatigue and dyspnea on exertion. SOB with exertion. + Orthopnea. Denies PND. No bleeding problems. Weight at home 216-217 pounds.  Worried about housing. Has limited money for food. Followed paramedicine.   SH: Lives in low income housing. Estranged from family  Labs 05/17/2015: K 3.9 Creatinine 1.23    ROS: All systems negative except as listed in HPI, PMH and Problem List.  SH:  Social History   Social History  . Marital Status: Divorced    Spouse Name: N/A  . Number of Children: 3  . Years of Education: N/A   Occupational History  . Unemployed    Social History Main Topics  . Smoking status: Current Every Day Smoker -- 0.50 packs/day for 40 years    Types: Cigarettes  . Smokeless tobacco: Never Used  .  Alcohol Use: No  . Drug Use: No  . Sexual Activity: No   Other Topics Concern  . Not on file   Social History Narrative   Has an apartment with a roommate. He was living on the streets in 01-03-2013.  He reports that his father died in [place] in January 03, 2013.  He is divorced.  He is no longer estranged from his son, but still from his daughter who lives locally.  Neither of his parents, nor any siblings have any history of CAD.    FH:  Family History  Problem Relation Age of Onset  . Diabetes Mother     Past Medical History  Diagnosis Date  . Hypertension   . Noncompliance     homelessness contributing.   Marland Kitchen CAD (coronary artery disease) Sept 2013    s/p cardiac cath showing occlusion of small RCA with collaterals  . Chronic anticoagulation     on xarelto.   . Atrial fibrillation ([facility])     RVR 10/2014  . Peripheral arterial disease ([facility])   . Automatic implantable cardioverter-defibrillator in situ   . High cholesterol   . Myocardial infarction ([facility]) 2014  . CHF (congestive heart failure) ([facility])     20 to 25 % EF and RV dysfunction by 07/2014 echo   . Type II diabetes mellitus ([facility])  Current Outpatient Prescriptions  Medication Sig Dispense Refill  . albuterol (PROVENTIL HFA;VENTOLIN HFA) 108 (90 Base) MCG/ACT inhaler Inhale 2 puffs into the lungs every 6 (six) hours as needed for wheezing or shortness of breath. 1 Inhaler 2  . albuterol (PROVENTIL) (2.5 MG/3ML) 0.083% nebulizer solution Take 3 mLs (2.5 mg total) by nebulization every 6 (six) hours as needed for wheezing or shortness of breath. 150 mL 1  . amiodarone (PACERONE) 200 MG tablet Take 1 tablet (200 mg total) by mouth 2 (two) times daily. 60 tablet 3  . atorvastatin (LIPITOR) 40 MG tablet Take 1 tablet (40 mg total) by mouth daily at 6 PM. 30 tablet 2  . Blood Glucose Monitoring Suppl (ACCU-CHEK AVIVA) device Use as instructed 3 times daily before meals 1 each 0  . carvedilol (COREG) 3.125 MG tablet Take 1  tablet (3.125 mg total) by mouth 2 (two) times daily with a meal. 60 tablet 2  . digoxin (LANOXIN) 0.125 MG tablet Take 0.0625 mg by mouth daily.    . ferrous sulfate 325 (65 FE) MG EC tablet Take 325 mg by mouth 2 (two) times daily.    . Fluticasone-Salmeterol (ADVAIR) 100-50 MCG/DOSE AEPB Inhale 1 puff into the lungs daily.    Marland Kitchen glipiZIDE (GLUCOTROL) 10 MG tablet Take 1 tablet (10 mg total) by mouth 2 (two) times daily before a meal. 60 tablet 2  . isosorbide-hydrALAZINE (BIDIL) 20-37.5 MG tablet Take 1 tablet by mouth 2 (two) times daily.    . pantoprazole (PROTONIX) 40 MG tablet Take 1 tablet (40 mg total) by mouth 2 (two) times daily. 60 tablet 2  . rivaroxaban (XARELTO) 20 MG TABS tablet Take 1 tablet (20 mg total) by mouth daily before supper. 30 tablet 2  . spironolactone (ALDACTONE) 25 MG tablet Take 1 tablet (25 mg total) by mouth daily. 30 tablet 2  . torsemide (DEMADEX) 20 MG tablet Take 4 tablets (80 mg total) by mouth 2 (two) times daily. 240 tablet 3  . traMADol (ULTRAM) 50 MG tablet Take 1 tablet (50 mg total) by mouth every 8 (eight) hours as needed for moderate pain. 60 tablet 1  . metolazone (ZAROXOLYN) 5 MG tablet Take 1 tablet (5 mg total) by mouth as directed. (Patient not taking: Reported on 08/15/2015) 10 tablet 0   No current facility-administered medications for this encounter.    Filed Vitals:   08/15/15 1357  BP: 120/82  Pulse: 117  Weight: 226 lb (102.513 kg)  SpO2: 97%  REDS VEST READING 47  Wt Readings from Last 3 Encounters:  08/15/15 226 lb (102.513 kg)  05/17/15 231 lb 6.4 oz (104.962 kg)  05/12/15 230 lb (104.327 kg)      PHYSICAL EXAM: General:  Chronically ill appearing. No resp difficulty. Arrived in a wheelchair HEENT: normal Neck: supple. JVP elevated to jaw. Carotids 2+ bilaterally; no bruits. No thyromegaly or nodule noted. Cor: PMI normal. Irregular rate & rhythm, slightly tachy. No rubs, gallops or murmurs. Lungs: Mild basilar  crackles Abdomen: soft, NT, ND, no HSM. No bruits or masses. +BS  Extremities: no cyanosis, clubbing, rash, R and LLE trace edema.  Neuro: alert & orientedx3, cranial nerves grossly intact. Moves all 4 extremities w/o difficulty. Affect pleasant.  ASSESSMENT & PLAN: 1. Acute/Chronic Systolic Heart Failure - ICM EF 20-25%. Has [employer] ICD.  NYHA IIIb symptoms. Marked volume overload. Given 80 mg IV lasix + 20 meq of potassium . Had ~400 cc urine output.  - Continue torsemide to  80 mg BID.   - - Continue 25 mg  spiro, and dig 0.0625 mg.  - No BB with recent cardiogenic shock.  - Continue bidil 1 tab tid.  - No ace for now with soft SBP.  - He is not a candidate for advanced therapies given social situation.  BMET and BNP today.  2. Chronic A fib -  Rate controlled. Continue Amio 200 mg twice a day,  Xarelto. No bleeding problems.  3. DMII- Per PCP  4. H/O GI Bleed - duodenal ulcer with clip 9/5 5. PAD- Last ABI 2015 . R EIA stent with known bilateral SFA occlusion.  Repeat ABI ok. Dr Gwenlyn Found recommends yearly ABI.   5. Homelessness- Currently resides in low income housing. Having difficulty paying for housing again.  6. Smoking- Current smoker 1/2-1 PPD. Counseled on smoking cessation.   Check BMET, CBC, BNP today.  Today HF SW and Paramedic met with him during the visit.  Follow up tomorrow to reassess volume status. May need more IV lasix.    Ngai Parcell NP-C   2:19 PM

## 2015-08-15 NOTE — Progress Notes (Signed)
Pt had 400 cc of urine output, IV d/c'd catheter intact.  Follow up appt sch for tomorrow AM at 8:30

## 2015-08-15 NOTE — Progress Notes (Signed)
CSW referred by paramedicine to assist with housing. Patient reports he can't afford current housing and needs to be out by end of the month. Patient shared that he has been excluded from the Targeted program which he had anticipated getting housing for $200 monthly and because of a credit issue left from his ex-wife he has become ineligible. Patient feeling very frustrated as he went to legal aide for assistance with the credit issue and was told there is nothing that can be done. CSW offered options through [email or web address] although limited choices and the credit becomes an issue again to obtain apartment. CSW provided support and will continue to explore options to assist patient with housing. Lasandra Beech, LCSW [phone]

## 2015-08-15 NOTE — Progress Notes (Signed)
Advanced Heart Failure Medication Review by a Pharmacist  Does the patient  feel that his/her medications are working for him/her?  yes  Has the patient been experiencing any side effects to the medications prescribed?  no  Does the patient measure his/her own blood pressure or blood glucose at home?  no   Does the patient have any problems obtaining medications due to transportation or finances?   No - uses [facility]   Understanding of regimen: fair Understanding of indications: fair Potential of compliance: good Patient understands to avoid NSAIDs. Patient understands to avoid decongestants.  Issues to address at subsequent visits: Compliance with medications/visits    Pharmacist comments:  Jacob [name] is a pleasant 60 yo M presenting with Jacob [name] (paramedic) who helps fill his pillbox for him weekly. He reports good compliance with his regimen but has missed most of his appointments over the last few months. He asked if there were any vitamins he could take to help with his fatigue and I advised him that I would like the provider to assess him first since this may be related to his underlying disease. He did not have any other medication-related questions or concerns for me at this time.   [name]. Bonnye Fava, PharmD, BCPS, CPP Clinical Pharmacist Pager: [phone] Phone: [phone] 08/15/2015 2:36 PM      Time with patient: 10 minutes Preparation and documentation time: 2 minutes Total time: 12 minutes

## 2015-08-15 NOTE — Patient Instructions (Signed)
Your physician recommends that you schedule a follow-up appointment in: TOMORROW AT 8:30

## 2015-08-16 ENCOUNTER — Inpatient Hospital Stay (HOSPITAL_COMMUNITY): Admission: RE | Admit: 2015-08-16 | Payer: Medicare Other | Source: Ambulatory Visit

## 2015-08-23 ENCOUNTER — Inpatient Hospital Stay (HOSPITAL_COMMUNITY): Admission: RE | Admit: 2015-08-23 | Payer: Medicare Other | Source: Ambulatory Visit

## 2015-08-30 ENCOUNTER — Telehealth: Payer: Self-pay | Admitting: Licensed Clinical Social Worker

## 2015-08-30 NOTE — Telephone Encounter (Signed)
CSW received call from [facility] paramedic stating that patient went for walk through on housing option provided yesterday. Patient stated he did not like the room for rent. Patient with limited options continues to search for housing. CSW will again explore options through [email or web address] and any other options that may arise. Lasandra Beech, LCSW [phone]

## 2015-08-31 ENCOUNTER — Ambulatory Visit (HOSPITAL_COMMUNITY)
Admission: RE | Admit: 2015-08-31 | Discharge: 2015-08-31 | Disposition: A | Discharge status: Home / Self Care | Payer: Medicare Other | Source: Ambulatory Visit | Attending: Internal Medicine | Admitting: Internal Medicine

## 2015-08-31 VITALS — BP 146/98 | HR 123 | Wt 201.4 lb

## 2015-08-31 DIAGNOSIS — I252 Old myocardial infarction: Secondary | ICD-10-CM | POA: Insufficient documentation

## 2015-08-31 DIAGNOSIS — Z7901 Long term (current) use of anticoagulants: Secondary | ICD-10-CM | POA: Diagnosis not present

## 2015-08-31 DIAGNOSIS — I2510 Atherosclerotic heart disease of native coronary artery without angina pectoris: Secondary | ICD-10-CM | POA: Diagnosis not present

## 2015-08-31 DIAGNOSIS — I5042 Chronic combined systolic (congestive) and diastolic (congestive) heart failure: Secondary | ICD-10-CM

## 2015-08-31 DIAGNOSIS — Z8711 Personal history of peptic ulcer disease: Secondary | ICD-10-CM | POA: Insufficient documentation

## 2015-08-31 DIAGNOSIS — Z7984 Long term (current) use of oral hypoglycemic drugs: Secondary | ICD-10-CM | POA: Diagnosis not present

## 2015-08-31 DIAGNOSIS — I5022 Chronic systolic (congestive) heart failure: Secondary | ICD-10-CM | POA: Diagnosis not present

## 2015-08-31 DIAGNOSIS — F17210 Nicotine dependence, cigarettes, uncomplicated: Secondary | ICD-10-CM | POA: Insufficient documentation

## 2015-08-31 DIAGNOSIS — F17200 Nicotine dependence, unspecified, uncomplicated: Secondary | ICD-10-CM

## 2015-08-31 DIAGNOSIS — I5023 Acute on chronic systolic (congestive) heart failure: Secondary | ICD-10-CM

## 2015-08-31 DIAGNOSIS — Z79899 Other long term (current) drug therapy: Secondary | ICD-10-CM | POA: Insufficient documentation

## 2015-08-31 DIAGNOSIS — E119 Type 2 diabetes mellitus without complications: Secondary | ICD-10-CM | POA: Diagnosis not present

## 2015-08-31 DIAGNOSIS — E7800 Pure hypercholesterolemia, unspecified: Secondary | ICD-10-CM | POA: Insufficient documentation

## 2015-08-31 DIAGNOSIS — I255 Ischemic cardiomyopathy: Secondary | ICD-10-CM | POA: Diagnosis not present

## 2015-08-31 DIAGNOSIS — I739 Peripheral vascular disease, unspecified: Secondary | ICD-10-CM | POA: Insufficient documentation

## 2015-08-31 DIAGNOSIS — I482 Chronic atrial fibrillation, unspecified: Secondary | ICD-10-CM

## 2015-08-31 DIAGNOSIS — I110 Hypertensive heart disease with heart failure: Secondary | ICD-10-CM | POA: Insufficient documentation

## 2015-08-31 DIAGNOSIS — Z833 Family history of diabetes mellitus: Secondary | ICD-10-CM | POA: Diagnosis not present

## 2015-08-31 DIAGNOSIS — Z95810 Presence of automatic (implantable) cardiac defibrillator: Secondary | ICD-10-CM | POA: Diagnosis not present

## 2015-08-31 LAB — BASIC METABOLIC PANEL
ANION GAP: 7 (ref 5–15)
BUN: 20 mg/dL (ref 6–20)
CALCIUM: 8.5 mg/dL — AB (ref 8.9–10.3)
CO2: 31 mmol/L (ref 22–32)
Chloride: 98 mmol/L — ABNORMAL LOW (ref 101–111)
Creatinine, Ser: 1.14 mg/dL (ref 0.61–1.24)
GFR calc Af Amer: >60 mL/min (ref >60)
GLUCOSE: 209 mg/dL — AB (ref 65–99)
Potassium: 3.9 mmol/L (ref 3.5–5.1)
Sodium: 136 mmol/L (ref 135–145)

## 2015-08-31 NOTE — Progress Notes (Signed)
Advanced Heart Failure Medication Review by a Pharmacist  Does the patient  feel that his/her medications are working for him/her?  yes  Has the patient been experiencing any side effects to the medications prescribed?  no  Does the patient measure his/her own blood pressure or blood glucose at home?  no   Does the patient have any problems obtaining medications due to transportation or finances?   no  Understanding of regimen: fair Understanding of indications: fair Potential of compliance: fair Patient understands to avoid NSAIDs. Patient understands to avoid decongestants.   Pharmacist comments: Jacob [name] is a pleasant 62 yom presenting to clinic for a follow-up appointment. He has a fair understanding of his medication regimen (did not bring his medication bottles with him this time). He states that he does not miss any doses and that he is not experiencing any medication-related side effects. His only complaint is that he has been feeling more tired lately and has no energy. He did not have any medication-related questions at this time.   States that he is taking torsemide 80mg  in the morning and none in the afternoon (previous med rec w/ 80mg  in the morning and 40 in the afternoon). Unable to accurately assess the actual dose that he is taking at home.    Time with patient: 10 min  Preparation and documentation time: 2 min  Total time: 12 min   Jacob [name], PharmD Pharmacy Resident  Pager: [phone] 08/31/2015 3:53 PM

## 2015-08-31 NOTE — Patient Instructions (Signed)
Labs today  Your physician recommends that you schedule a follow-up appointment in: 6 weeks  Do the following things EVERYDAY: 1) Weigh yourself in the morning before breakfast. Write it down and keep it in a log. 2) Take your medicines as prescribed 3) Eat low salt foods-Limit salt (sodium) to 2000 mg per day.  4) Stay as active as you can everyday 5) Limit all fluids for the day to less than 2 liters 6)   

## 2015-08-31 NOTE — Progress Notes (Signed)
Patient ID: Jacob [name], male   DOB: 01/06/1956, 60 y.o.   MRN: [id number]    Advanced Heart Failure Clinic Note   PCP: None  Primary HF Cardiologist: Dr Jacob [name]   HPI: Mr Jacob [name] is a 60 year old [place] male with history of systolic HF EF 20-25% via Echo 08/03/14 s/p INCEPTA ICD implant 6/14, RV dysfunction, CAD s/p CABG x 2 w/ RF MAZE 7/14 at [facility], DM type II, Chronic afib on xarelto, GI bleed 11/28/2014, and HLD.   Admitted the end of August 2016  with increased dyspnea on exertion. Later found to be in cardiogenic shock . At one point on dual pressors milrinone and norepi. Diuresed with IV lasix and transitioned to po lasix. Hospital course complicated by GI bleed and A fib RVR. Loaded on amio but later placed on toprol xl for rate control. On 9/5 had EGD with duodenal bleed with clip applied. He was discharged 9/12 with D/C weight 203 pounds.   Admitted late December 2016 with increased dyspnea. Diuresed with IV lasix and transitioned to po lasix 80 mg twice a day. Chronic A fib and continued on amio 200 mg twice a day. Discharge weight was 205 pounds.   He returns today for HF follow up.Last visit he was given 80 mg IV lasix. He was supposed to follow up the next day but missed his last appointment. Complains of fatigue and mild dyspnea with exertion. Denies PND. Able to walk around the grocery store slowly. No bleeding problems. Scale at home not working. Thinks weight is  214-215 pounds. Says he is taking his medications. Followed by paramedicine.   SH: Lives in low income housing. Estranged from family  Labs 05/17/2015: K 3.9 Creatinine 1.23  Labs 08/15/2015: K 4.1 Creatinine 1.412    ROS: All systems negative except as listed in HPI, PMH and Problem List.  SH:  Social History   Social History  . Marital Status: Divorced    Spouse Name: Jacob [name]  . Number of Children: 3  . Years of Education: Jacob [name]   Occupational History  . Unemployed    Social History Main Topics  . Smoking  status: Current Every Day Smoker -- 0.50 packs/day for 40 years    Types: Cigarettes  . Smokeless tobacco: Never Used  . Alcohol Use: No  . Drug Use: No  . Sexual Activity: No   Other Topics Concern  . Not on file   Social History Narrative   Has an apartment with a roommate. He was living on the streets in 2013-01-28.  He reports that his father died in [place] in 28-Jan-2013.  He is divorced.  He is no longer estranged from his son, but still from his daughter who lives locally.  Neither of his parents, nor any siblings have any history of CAD.    FH:  Family History  Problem Relation Age of Onset  . Diabetes Mother     Past Medical History  Diagnosis Date  . Hypertension   . Noncompliance     homelessness contributing.   Marland Kitchen CAD (coronary artery disease) Sept 2013    s/p cardiac cath showing occlusion of small RCA with collaterals  . Chronic anticoagulation     on xarelto.   . Atrial fibrillation ([facility])     RVR 10/2014  . Peripheral arterial disease ([facility])   . Automatic implantable cardioverter-defibrillator in situ   . High cholesterol   . Myocardial infarction ([facility]) 2014  . CHF (congestive heart failure) (  [facility])     20 to 25 % EF and RV dysfunction by 07/2014 echo   . Type II diabetes mellitus ([facility])     Current Outpatient Prescriptions  Medication Sig Dispense Refill  . albuterol (PROVENTIL HFA;VENTOLIN HFA) 108 (90 Base) MCG/ACT inhaler Inhale 2 puffs into the lungs every 6 (six) hours as needed for wheezing or shortness of breath. 1 Inhaler 2  . albuterol (PROVENTIL) (2.5 MG/3ML) 0.083% nebulizer solution Take 3 mLs (2.5 mg total) by nebulization every 6 (six) hours as needed for wheezing or shortness of breath. 150 mL 1  . amiodarone (PACERONE) 200 MG tablet Take 1 tablet (200 mg total) by mouth 2 (two) times daily. 60 tablet 3  . atorvastatin (LIPITOR) 40 MG tablet Take 1 tablet (40 mg total) by mouth daily at 6 PM. 30 tablet 2  . Blood Glucose Monitoring Suppl  (ACCU-CHEK AVIVA) device Use as instructed 3 times daily before meals 1 each 0  . carvedilol (COREG) 3.125 MG tablet Take 1 tablet (3.125 mg total) by mouth 2 (two) times daily with a meal. 60 tablet 2  . digoxin (LANOXIN) 0.125 MG tablet Take 0.0625 mg by mouth daily.    . ferrous sulfate 325 (65 FE) MG EC tablet Take 325 mg by mouth daily with breakfast.     . Fluticasone-Salmeterol (ADVAIR) 100-50 MCG/DOSE AEPB Inhale 1 puff into the lungs daily.    Marland Kitchen glipiZIDE (GLUCOTROL) 10 MG tablet Take 1 tablet (10 mg total) by mouth 2 (two) times daily before a meal. 60 tablet 2  . isosorbide-hydrALAZINE (BIDIL) 20-37.5 MG tablet Take 1 tablet by mouth 2 (two) times daily.    . pantoprazole (PROTONIX) 40 MG tablet Take 40 mg by mouth daily as needed.    . rivaroxaban (XARELTO) 20 MG TABS tablet Take 1 tablet (20 mg total) by mouth daily before supper. 30 tablet 2  . spironolactone (ALDACTONE) 25 MG tablet Take 1 tablet (25 mg total) by mouth daily. 30 tablet 2  . torsemide (DEMADEX) 20 MG tablet Take 4 tablets (80 mg total) by mouth 2 (two) times daily. (Patient taking differently: Take 80 mg by mouth 2 (two) times daily. 4 tablets once daily) 240 tablet 3  . torsemide (DEMADEX) 20 MG tablet Take 80 mg by mouth daily. Take 4 tablets (80mg ) once daily    . traMADol (ULTRAM) 50 MG tablet Take 1 tablet (50 mg total) by mouth every 8 (eight) hours as needed for moderate pain. 60 tablet 1   No current facility-administered medications for this encounter.    Filed Vitals:   08/31/15 1517  BP: 146/98  Pulse: 123  Weight: 201 lb 6.4 oz (91.354 kg)  SpO2: 93%   Wt Readings from Last 3 Encounters:  08/31/15 201 lb 6.4 oz (91.354 kg)  08/15/15 226 lb (102.513 kg)  05/17/15 231 lb 6.4 oz (104.962 kg)      PHYSICAL EXAM: General:  Chronically ill appearing. No resp difficulty. Arrived in a wheelchair HEENT: normal Neck: supple. JVP6-7. Carotids 2+ bilaterally; no bruits. No thyromegaly or nodule  noted. Cor: PMI normal. Irregular rate & rhythm, slightly tachy. No rubs, gallops or murmurs. Lungs: Mild basilar crackles Abdomen: soft, NT, ND, no HSM. No bruits or masses. +BS  Extremities: no cyanosis, clubbing, rash, R and LLE trace edema.  Neuro: alert & orientedx3, cranial nerves grossly intact. Moves all 4 extremities w/o difficulty. Affect pleasant.  EKG: A Fib RVR 115 bpm  ASSESSMENT & PLAN: 1.Chronic Systolic Heart  Failure - ICM EF 20-25%. Has [employer] ICD.  NYHA IIIb symptoms. Hard to know how well he is taking medications.  - Continue torsemide to 80 mg BID.   - - Continue 25 mg  spiro, and dig 0.0625 mg.  - No BB with recent cardiogenic shock.  - Continue bidil 1 tab tid.  - No ace for now with soft SBP.  - He is not a candidate for advanced therapies given social situation.  2. Chronic A fib -   Continue Amio 200 mg twice a day,  Xarelto. No bleeding problems.  CMET, TSH next visit. Needs yearly eye exams.  3. DMII- Per PCP  4. H/O GI Bleed - duodenal ulcer with clip 9/5 5. PAD- Last ABI 2015 . R EIA stent with known bilateral SFA occlusion.  Repeat ABI ok. Dr Allyson Sabal recommends yearly ABI.   5. Social  Issues-Currently resides in low income housing. Having difficulty paying for housing again. HF SW following.  6. Smoking- Current smoker. Counseled on smoking cessation.   Follow up in 6 weeks. Continue Paramedicine. Next visit check cmet, tsh.    Maliyah Willets NP-C   3:48 PM

## 2015-09-06 ENCOUNTER — Telehealth (HOSPITAL_COMMUNITY): Payer: Self-pay | Admitting: *Deleted

## 2015-09-06 NOTE — Telephone Encounter (Signed)
[name] called to verify pt's Torsemide dose, on his list from his last OV it is listed as 80 mg daily and 80 mg BID, upon review of chart appears pt should be on 80 mg BID, Florentina Addison states that is what pt has been taking for a while.  Med list updated

## 2015-09-13 ENCOUNTER — Emergency Department (HOSPITAL_COMMUNITY): Payer: Medicare Other

## 2015-09-13 ENCOUNTER — Encounter (HOSPITAL_COMMUNITY): Payer: Self-pay | Admitting: Emergency Medicine

## 2015-09-13 ENCOUNTER — Other Ambulatory Visit: Payer: Self-pay

## 2015-09-13 ENCOUNTER — Emergency Department (HOSPITAL_COMMUNITY)
Admission: EM | Admit: 2015-09-13 | Discharge: 2015-09-13 | Disposition: A | Discharge status: Home / Self Care | Payer: Medicare Other | Attending: Emergency Medicine | Admitting: Emergency Medicine

## 2015-09-13 DIAGNOSIS — Z7984 Long term (current) use of oral hypoglycemic drugs: Secondary | ICD-10-CM | POA: Diagnosis not present

## 2015-09-13 DIAGNOSIS — I110 Hypertensive heart disease with heart failure: Secondary | ICD-10-CM | POA: Diagnosis not present

## 2015-09-13 DIAGNOSIS — R0789 Other chest pain: Secondary | ICD-10-CM | POA: Diagnosis present

## 2015-09-13 DIAGNOSIS — Z951 Presence of aortocoronary bypass graft: Secondary | ICD-10-CM | POA: Diagnosis not present

## 2015-09-13 DIAGNOSIS — E119 Type 2 diabetes mellitus without complications: Secondary | ICD-10-CM | POA: Insufficient documentation

## 2015-09-13 DIAGNOSIS — R0602 Shortness of breath: Secondary | ICD-10-CM | POA: Diagnosis not present

## 2015-09-13 DIAGNOSIS — I509 Heart failure, unspecified: Secondary | ICD-10-CM | POA: Insufficient documentation

## 2015-09-13 DIAGNOSIS — I252 Old myocardial infarction: Secondary | ICD-10-CM | POA: Diagnosis not present

## 2015-09-13 DIAGNOSIS — R079 Chest pain, unspecified: Secondary | ICD-10-CM | POA: Diagnosis not present

## 2015-09-13 DIAGNOSIS — R0600 Dyspnea, unspecified: Secondary | ICD-10-CM

## 2015-09-13 DIAGNOSIS — Z7901 Long term (current) use of anticoagulants: Secondary | ICD-10-CM | POA: Insufficient documentation

## 2015-09-13 DIAGNOSIS — I2510 Atherosclerotic heart disease of native coronary artery without angina pectoris: Secondary | ICD-10-CM | POA: Diagnosis not present

## 2015-09-13 DIAGNOSIS — F17210 Nicotine dependence, cigarettes, uncomplicated: Secondary | ICD-10-CM | POA: Insufficient documentation

## 2015-09-13 DIAGNOSIS — Z95810 Presence of automatic (implantable) cardiac defibrillator: Secondary | ICD-10-CM | POA: Insufficient documentation

## 2015-09-13 LAB — CBC
HEMATOCRIT: 41.9 % (ref 39.0–52.0)
HEMOGLOBIN: 12.3 g/dL — AB (ref 13.0–17.0)
MCH: 24.3 pg — AB (ref 26.0–34.0)
MCHC: 29.4 g/dL — AB (ref 30.0–36.0)
MCV: 82.8 fL (ref 78.0–100.0)
Platelets: 181 10*3/uL (ref 150–400)
RBC: 5.06 MIL/uL (ref 4.22–5.81)
RDW: 19.1 % — ABNORMAL HIGH (ref 11.5–15.5)
WBC: 7.6 10*3/uL (ref 4.0–10.5)

## 2015-09-13 LAB — COMPREHENSIVE METABOLIC PANEL
ALK PHOS: 212 U/L — AB (ref 38–126)
ALT: 19 U/L (ref 17–63)
AST: 28 U/L (ref 15–41)
Albumin: 3.1 g/dL — ABNORMAL LOW (ref 3.5–5.0)
Anion gap: 9 (ref 5–15)
BILIRUBIN TOTAL: 1.5 mg/dL — AB (ref 0.3–1.2)
BUN: 19 mg/dL (ref 6–20)
CALCIUM: 8.8 mg/dL — AB (ref 8.9–10.3)
CO2: 31 mmol/L (ref 22–32)
CREATININE: 1.14 mg/dL (ref 0.61–1.24)
Chloride: 95 mmol/L — ABNORMAL LOW (ref 101–111)
Glucose, Bld: 239 mg/dL — ABNORMAL HIGH (ref 65–99)
Potassium: 3.8 mmol/L (ref 3.5–5.1)
SODIUM: 135 mmol/L (ref 135–145)
TOTAL PROTEIN: 7.1 g/dL (ref 6.5–8.1)

## 2015-09-13 LAB — TROPONIN I: Troponin I: 0.06 ng/mL — ABNORMAL HIGH (ref <0.031)

## 2015-09-13 LAB — PROTIME-INR
INR: 1.59 — ABNORMAL HIGH (ref 0.00–1.49)
PROTHROMBIN TIME: 19 s — AB (ref 11.6–15.2)

## 2015-09-13 LAB — MAGNESIUM: Magnesium: 1.7 mg/dL (ref 1.7–2.4)

## 2015-09-13 LAB — DIGOXIN LEVEL: DIGOXIN LVL: 0.3 ng/mL — AB (ref 0.8–2.0)

## 2015-09-13 MED ORDER — FUROSEMIDE 10 MG/ML IJ SOLN
60.0000 mg | Freq: Once | INTRAMUSCULAR | Status: AC
  Administered 2015-09-13: 60 mg via INTRAVENOUS
  Filled 2015-09-13: qty 6

## 2015-09-13 MED ORDER — ASPIRIN 81 MG PO CHEW
324.0000 mg | CHEWABLE_TABLET | Freq: Once | ORAL | Status: AC
Start: 2015-09-13 — End: 2015-09-13
  Administered 2015-09-13: 324 mg via ORAL
  Filled 2015-09-13: qty 4

## 2015-09-13 NOTE — ED Notes (Signed)
Patient transported to X-ray 

## 2015-09-13 NOTE — ED Notes (Signed)
MD at bedside. 

## 2015-09-13 NOTE — ED Provider Notes (Signed)
CSN: [id number]     Arrival date & time 09/13/15  1149 History   First MD Initiated Contact with Patient 09/13/15 1153     Chief Complaint  Patient presents with  . Shortness of Breath  . Chest Pain   HPI  Patient presents with concern of dyspnea, and chest pain. Patient seems to always have some degree of discomfort, but states that earlier today he began having increasing difficulty breathing, increasing anterior chest tightness. Pain on his long history of fatigue, multiple medical issues including CHF. He states that he is compliant with all medication. No fever, confusion, disorientation.   Past Medical History  Diagnosis Date  . Hypertension   . Noncompliance     homelessness contributing.   Marland Kitchen CAD (coronary artery disease) Sept 2013    s/p cardiac cath showing occlusion of small RCA with collaterals  . Chronic anticoagulation     on xarelto.   . Atrial fibrillation (HCC)     RVR 10/2014  . Peripheral arterial disease (HCC)   . Automatic implantable cardioverter-defibrillator in situ   . High cholesterol   . Myocardial infarction (HCC) 2014  . CHF (congestive heart failure) (HCC)     20 to 25 % EF and RV dysfunction by 07/2014 echo   . Type II diabetes mellitus [facility])    Past Surgical History  Procedure Laterality Date  . Implantable cardioverter defibrillator implant      [place] in 07/2012; [employer]  . Coronary artery bypass graft  09/2012    2 vessels per patient [name]   . Coronary angioplasty with stent placement  11/2011    "1"  . Cardiac catheterization  09/2012  . Iliac artery stent Right 08/30/2013  . Left and right heart catheterization with coronary angiogram N/A 12/02/2011    Procedure: LEFT AND RIGHT HEART CATHETERIZATION WITH CORONARY ANGIOGRAM;  Surgeon: Kathleene Hazel, MD;  Location: [facility] CATH LAB;  Service: Cardiovascular;  Laterality: N/A;  . Lower extremity angiogram N/A 08/30/2013    Procedure: LOWER EXTREMITY ANGIOGRAM;  Surgeon: Runell Gess, MD;  Location: [facility] CATH LAB;  Service: Cardiovascular;  Laterality: N/A;  . Lower extremity angiogram N/A 12/02/2013    Procedure: LOWER EXTREMITY ANGIOGRAM;  Surgeon: Runell Gess, MD;  Location: [facility] CATH LAB;  Service: Cardiovascular;  Laterality: N/A;  . Esophagogastroduodenoscopy N/A 11/28/2014    Procedure: ESOPHAGOGASTRODUODENOSCOPY (EGD);  Surgeon: Beverley Fiedler, MD;  Location: [facility] ENDOSCOPY;  Service: Endoscopy;  Laterality: N/A;   Family History  Problem Relation Age of Onset  . Diabetes Mother    Social History  Substance Use Topics  . Smoking status: Current Every Day Smoker -- 0.50 packs/day for 40 years    Types: Cigarettes  . Smokeless tobacco: Never Used  . Alcohol Use: No    Review of Systems  Constitutional:       Per HPI, otherwise negative  HENT:       Per HPI, otherwise negative  Respiratory:       Per HPI, otherwise negative  Cardiovascular:       Per HPI, otherwise negative  Gastrointestinal: Negative for vomiting.  Endocrine:       Negative aside from HPI  Genitourinary:       Neg aside from HPI   Musculoskeletal:       Per HPI, otherwise negative  Skin: Positive for color change.  Neurological: Positive for weakness. Negative for syncope.  Hematological: Bruises/bleeds easily.      Allergies  Tape  Home Medications   Prior to Admission medications   Medication Sig Start Date End Date Taking? Authorizing Provider  albuterol (PROVENTIL HFA;VENTOLIN HFA) 108 (90 Base) MCG/ACT inhaler Inhale 2 puffs into the lungs every 6 (six) hours as needed for wheezing or shortness of breath. 03/29/15  Yes Jaclyn Shaggy, MD  albuterol (PROVENTIL) (2.5 MG/3ML) 0.083% nebulizer solution Take 3 mLs (2.5 mg total) by nebulization every 6 (six) hours as needed for wheezing or shortness of breath. 04/18/15  Yes Jaclyn Shaggy, MD  amiodarone (PACERONE) 200 MG tablet Take 1 tablet (200 mg total) by mouth 2 (two) times daily. 04/04/15  Yes Dolores Patty, MD   atorvastatin (LIPITOR) 40 MG tablet Take 1 tablet (40 mg total) by mouth daily at 6 PM. 04/04/15  Yes Jaclyn Shaggy, MD  Blood Glucose Monitoring Suppl (ACCU-CHEK AVIVA) device Use as instructed 3 times daily before meals 04/18/15  Yes Jaclyn Shaggy, MD  carvedilol (COREG) 3.125 MG tablet Take 1 tablet (3.125 mg total) by mouth 2 (two) times daily with a meal. 04/04/15  Yes Jaclyn Shaggy, MD  digoxin (LANOXIN) 0.125 MG tablet Take 0.0625 mg by mouth daily.   Yes Historical Provider, MD  ferrous sulfate 325 (65 FE) MG EC tablet Take 325 mg by mouth daily with breakfast.    Yes Historical Provider, MD  Fluticasone-Salmeterol (ADVAIR) 100-50 MCG/DOSE AEPB Inhale 1 puff into the lungs daily.   Yes Historical Provider, MD  glipiZIDE (GLUCOTROL) 10 MG tablet Take 1 tablet (10 mg total) by mouth 2 (two) times daily before a meal. 04/04/15  Yes Enobong Amao, MD  isosorbide-hydrALAZINE (BIDIL) 20-37.5 MG tablet Take 1 tablet by mouth 2 (two) times daily.   Yes Historical Provider, MD  pantoprazole (PROTONIX) 40 MG tablet Take 40 mg by mouth daily as needed.   Yes Historical Provider, MD  rivaroxaban (XARELTO) 20 MG TABS tablet Take 1 tablet (20 mg total) by mouth daily before supper. 04/04/15  Yes Jaclyn Shaggy, MD  spironolactone (ALDACTONE) 25 MG tablet Take 1 tablet (25 mg total) by mouth daily. 04/04/15  Yes Jaclyn Shaggy, MD  torsemide (DEMADEX) 20 MG tablet Take 4 tablets (80 mg total) by mouth 2 (two) times daily. Patient taking differently: Take 80 mg by mouth 2 (two) times daily. 4 tablets once daily 05/17/15  Yes Mariam Dollar Tillery, PA-C  traMADol (ULTRAM) 50 MG tablet Take 1 tablet (50 mg total) by mouth every 8 (eight) hours as needed for moderate pain. 01/30/15  Yes Enobong Amao, MD   BP 147/105 mmHg  Pulse 113  Temp(Src) 98.1 F (36.7 C) (Oral)  Resp 20  Ht 5\' 4"  (1.626 m)  Wt 211 lb (95.709 kg)  BMI 36.20 kg/m2  SpO2 93% Physical Exam  Constitutional: He is oriented to person, place, and  time. He appears well-developed. No distress.  HENT:  Head: Normocephalic and atraumatic.  Eyes: Conjunctivae and EOM are normal.  Cardiovascular: Normal rate and regular rhythm.   Pulmonary/Chest: No stridor. He is in respiratory distress. He has decreased breath sounds. He has wheezes.  Abdominal: He exhibits distension.  Musculoskeletal: He exhibits no edema.  Neurological: He is alert and oriented to person, place, and time.  Skin: Skin is warm and dry.  jaudice  Psychiatric: He has a normal mood and affect.  Nursing note and vitals reviewed.   ED Course  Procedures (including critical care time) Labs Review Labs Reviewed  CBC - Abnormal; Notable for the following:    Hemoglobin 12.3 (*)  MCH 24.3 (*)    MCHC 29.4 (*)    RDW 19.1 (*)    All other components within normal limits  COMPREHENSIVE METABOLIC PANEL - Abnormal; Notable for the following:    Chloride 95 (*)    Glucose, Bld 239 (*)    Calcium 8.8 (*)    Albumin 3.1 (*)    Alkaline Phosphatase 212 (*)    Total Bilirubin 1.5 (*)    All other components within normal limits  PROTIME-INR - Abnormal; Notable for the following:    Prothrombin Time 19.0 (*)    INR 1.59 (*)    All other components within normal limits  TROPONIN I - Abnormal; Notable for the following:    Troponin I 0.06 (*)    All other components within normal limits  MAGNESIUM  DIGOXIN LEVEL    Imaging Review Dg Chest 2 View  09/13/2015  CLINICAL DATA:  Left side chest pain, shortness of breath, onset 1 day ago. EXAM: CHEST  2 VIEW COMPARISON:  03/19/2015 FINDINGS: Cardiomegaly with vascular congestion and diffuse interstitial prominence, likely interstitial edema. Moderate right pleural effusion with right lower lobe atelectasis or infiltrate. Left AICD remains in place, unchanged. IMPRESSION: Suspect mild interstitial edema/ CHF. Moderate right pleural effusion with right lower lobe atelectasis or infiltrate. Electronically Signed   By: Charlett Nose M.D.   On: 09/13/2015 12:23   I have personally reviewed and evaluated these images and lab results as part of my medical decision-making.   EKG Interpretation   Date/Time:  Wednesday September 13 2015 11:49:34 EDT Ventricular Rate:  118 PR Interval:    QRS Duration: 116 QT Interval:  375 QTC Calculation: 526 R Axis:   -101 Text Interpretation:  Atrial flutter Left anterior fascicular block ST-t  wave abnormality No significant change since last tracing Abnormal ekg  Confirmed by Gerhard Munch  MD [phone]) on 09/13/2015 11:53:16 AM Also  confirmed by Gerhard Munch  MD ([id number]), editor [place], [place] [phone])   on 09/13/2015 12:19:49 PM     Chart review notable for multiple medical issues including CHF, last EF 25%. After the initial evaluation the patient received Lasix.  X-ray interpretation of possible infiltrate or atelectasis is noted. Patient is afebrile, no leukocytosis, no cough.   MDM  Patient with multiple medical issues including CHF, renal dysfunction, presents with dyspnea, chest pain. Here is awake and alert, but has notable increased work of breathing. X-ray demonstrates pleural effusion, CHF, edema. Given the patient's multiple medical issues, after initiation of therapy in the emergency department he was Going to be admitted for further evaluation and management, but the patient refused admission. I discussed risks and benefits of leaving prior to improvement, the patient was adamant about not staying for further treatment. Patient discharged, per his request.   Gerhard Munch, MD 09/13/15 1600

## 2015-09-13 NOTE — ED Notes (Signed)
Pt arrives from home via EMS reporting sharp L sided CP new today with SOB.  Pt denies LOC, N/V.  93%RA, 25RR.

## 2015-09-18 ENCOUNTER — Other Ambulatory Visit (HOSPITAL_COMMUNITY): Payer: Self-pay | Admitting: Pharmacist

## 2015-09-18 ENCOUNTER — Telehealth (HOSPITAL_COMMUNITY): Payer: Self-pay

## 2015-09-18 DIAGNOSIS — I5042 Chronic combined systolic (congestive) and diastolic (congestive) heart failure: Secondary | ICD-10-CM

## 2015-09-18 DIAGNOSIS — I255 Ischemic cardiomyopathy: Secondary | ICD-10-CM

## 2015-09-18 DIAGNOSIS — I25811 Atherosclerosis of native coronary artery of transplanted heart without angina pectoris: Secondary | ICD-10-CM

## 2015-09-18 DIAGNOSIS — I482 Chronic atrial fibrillation, unspecified: Secondary | ICD-10-CM

## 2015-09-18 MED ORDER — ISOSORB DINITRATE-HYDRALAZINE 20-37.5 MG PO TABS: 1.0000 | ORAL_TABLET | Freq: Two times a day (BID) | ORAL | Status: DC

## 2015-09-18 MED ORDER — SPIRONOLACTONE 25 MG PO TABS: 25.0000 mg | ORAL_TABLET | Freq: Every day | ORAL | Status: DC

## 2015-09-18 MED ORDER — CARVEDILOL 3.125 MG PO TABS: 3.1250 mg | ORAL_TABLET | Freq: Two times a day (BID) | ORAL | Status: DC

## 2015-09-18 MED ORDER — RIVAROXABAN 20 MG PO TABS: 20.0000 mg | ORAL_TABLET | Freq: Every day | ORAL | Status: DC

## 2015-09-18 MED ORDER — ATORVASTATIN CALCIUM 40 MG PO TABS: 40.0000 mg | ORAL_TABLET | Freq: Every day | ORAL | Status: DC

## 2015-09-18 MED ORDER — TORSEMIDE 20 MG PO TABS: 80.0000 mg | ORAL_TABLET | Freq: Two times a day (BID) | ORAL | Status: DC

## 2015-09-18 MED ORDER — AMIODARONE HCL 200 MG PO TABS: 200.0000 mg | ORAL_TABLET | Freq: Two times a day (BID) | ORAL | Status: DC

## 2015-09-18 MED ORDER — DIGOXIN 125 MCG PO TABS: 0.0625 mg | ORAL_TABLET | Freq: Every day | ORAL | Status: DC

## 2015-09-18 NOTE — Telephone Encounter (Signed)
[name] with Paramedicine called to report patient's weight up to 233 lbs. States his normal weight at home is around 220 lbs, however, looking in charts his last weight documented in ED 6 days ago was 211. Patient was in ED for A on C CHF but refused admission and left AMA after MD in ED started to initial treatment and recommended admission. Georg Ruddle report patient has not taken any of his medications for no other reason other than "he didn't feel like it". Advised if he feels bad to go back to ED as we do not have any openings tomorrow, and to educate patient on importance of following instruction of providers in CHF clinic after appointments, otherwise these appointments are in vain. [name] voices she will educate patient on important of med, diet, and fluid compliance and will see if he is willing to take his meds today.  Ave Filter

## 2015-09-19 ENCOUNTER — Telehealth (HOSPITAL_COMMUNITY): Payer: Self-pay | Admitting: Pharmacist

## 2015-09-19 MED FILL — ATORVASTATIN 40 MG TABLET: 40 | 30 days supply | Qty: 30 | Fill #0 | Status: TO

## 2015-09-19 MED FILL — DIGITEK 125 MCG TABLET: 125 | 30 days supply | Qty: 15 | Fill #0 | Status: TO

## 2015-09-19 MED FILL — TORSEMIDE 20 MG TABLET: 20 | 30 days supply | Qty: 240 | Fill #0 | Status: TO

## 2015-09-19 MED FILL — CARVEDILOL 3.125 MG TABLET: 3.125 | 30 days supply | Qty: 60 | Fill #0 | Status: TO

## 2015-09-19 MED FILL — SPIRONOLACTONE 25 MG TABLET: 25 | 30 days supply | Qty: 30 | Fill #0 | Status: TO

## 2015-09-19 MED FILL — AMIODARONE HCL 200 MG TAB: 200 | 30 days supply | Qty: 60 | Fill #0 | Status: TO

## 2015-09-19 NOTE — Telephone Encounter (Signed)
Xarelto 20 mg daily PA approved by [employer] Part D through 03/24/16. Ref #[id number]. Outpatient pharmacy verified copay $3.70/mo.   [name]. Bonnye Fava, PharmD, BCPS, CPP Clinical Pharmacist Pager: [phone] Phone: [phone] 09/19/2015 2:50 PM

## 2015-11-08 ENCOUNTER — Encounter (HOSPITAL_COMMUNITY): Payer: Medicare Other

## 2015-11-08 ENCOUNTER — Inpatient Hospital Stay (HOSPITAL_COMMUNITY)
Admission: EM | Admit: 2015-11-08 | Discharge: 2015-11-14 | DRG: 291 | Disposition: A | Discharge status: Home / Self Care | Payer: Medicare Other | Attending: Internal Medicine | Admitting: Internal Medicine

## 2015-11-08 ENCOUNTER — Emergency Department (HOSPITAL_COMMUNITY): Payer: Medicare Other

## 2015-11-08 ENCOUNTER — Encounter (HOSPITAL_COMMUNITY): Payer: Self-pay | Admitting: Internal Medicine

## 2015-11-08 DIAGNOSIS — I5020 Unspecified systolic (congestive) heart failure: Secondary | ICD-10-CM | POA: Diagnosis not present

## 2015-11-08 DIAGNOSIS — I255 Ischemic cardiomyopathy: Secondary | ICD-10-CM | POA: Diagnosis present

## 2015-11-08 DIAGNOSIS — F17210 Nicotine dependence, cigarettes, uncomplicated: Secondary | ICD-10-CM | POA: Diagnosis present

## 2015-11-08 DIAGNOSIS — Z955 Presence of coronary angioplasty implant and graft: Secondary | ICD-10-CM

## 2015-11-08 DIAGNOSIS — I5023 Acute on chronic systolic (congestive) heart failure: Secondary | ICD-10-CM | POA: Diagnosis not present

## 2015-11-08 DIAGNOSIS — I4891 Unspecified atrial fibrillation: Secondary | ICD-10-CM | POA: Diagnosis present

## 2015-11-08 DIAGNOSIS — Z91199 Patient's noncompliance with other medical treatment and regimen due to unspecified reason: Secondary | ICD-10-CM

## 2015-11-08 DIAGNOSIS — Z7901 Long term (current) use of anticoagulants: Secondary | ICD-10-CM

## 2015-11-08 DIAGNOSIS — Z95810 Presence of automatic (implantable) cardiac defibrillator: Secondary | ICD-10-CM

## 2015-11-08 DIAGNOSIS — I509 Heart failure, unspecified: Secondary | ICD-10-CM | POA: Insufficient documentation

## 2015-11-08 DIAGNOSIS — I130 Hypertensive heart and chronic kidney disease with heart failure and stage 1 through stage 4 chronic kidney disease, or unspecified chronic kidney disease: Principal | ICD-10-CM | POA: Diagnosis present

## 2015-11-08 DIAGNOSIS — E1122 Type 2 diabetes mellitus with diabetic chronic kidney disease: Secondary | ICD-10-CM | POA: Diagnosis present

## 2015-11-08 DIAGNOSIS — Z720 Tobacco use: Secondary | ICD-10-CM | POA: Diagnosis present

## 2015-11-08 DIAGNOSIS — R188 Other ascites: Secondary | ICD-10-CM | POA: Diagnosis present

## 2015-11-08 DIAGNOSIS — I482 Chronic atrial fibrillation: Secondary | ICD-10-CM | POA: Diagnosis present

## 2015-11-08 DIAGNOSIS — D649 Anemia, unspecified: Secondary | ICD-10-CM | POA: Diagnosis present

## 2015-11-08 DIAGNOSIS — Z9119 Patient's noncompliance with other medical treatment and regimen: Secondary | ICD-10-CM

## 2015-11-08 DIAGNOSIS — I444 Left anterior fascicular block: Secondary | ICD-10-CM | POA: Diagnosis present

## 2015-11-08 DIAGNOSIS — Z951 Presence of aortocoronary bypass graft: Secondary | ICD-10-CM

## 2015-11-08 DIAGNOSIS — R0602 Shortness of breath: Secondary | ICD-10-CM | POA: Diagnosis not present

## 2015-11-08 DIAGNOSIS — I252 Old myocardial infarction: Secondary | ICD-10-CM

## 2015-11-08 DIAGNOSIS — J9600 Acute respiratory failure, unspecified whether with hypoxia or hypercapnia: Secondary | ICD-10-CM | POA: Diagnosis present

## 2015-11-08 DIAGNOSIS — Z7951 Long term (current) use of inhaled steroids: Secondary | ICD-10-CM

## 2015-11-08 DIAGNOSIS — N179 Acute kidney failure, unspecified: Secondary | ICD-10-CM | POA: Diagnosis present

## 2015-11-08 DIAGNOSIS — E1151 Type 2 diabetes mellitus with diabetic peripheral angiopathy without gangrene: Secondary | ICD-10-CM | POA: Diagnosis present

## 2015-11-08 DIAGNOSIS — Z590 Homelessness: Secondary | ICD-10-CM

## 2015-11-08 DIAGNOSIS — Z6841 Body Mass Index (BMI) 40.0 and over, adult: Secondary | ICD-10-CM

## 2015-11-08 DIAGNOSIS — N183 Chronic kidney disease, stage 3 (moderate): Secondary | ICD-10-CM | POA: Diagnosis present

## 2015-11-08 DIAGNOSIS — I10 Essential (primary) hypertension: Secondary | ICD-10-CM | POA: Diagnosis not present

## 2015-11-08 DIAGNOSIS — I5043 Acute on chronic combined systolic (congestive) and diastolic (congestive) heart failure: Secondary | ICD-10-CM | POA: Diagnosis not present

## 2015-11-08 DIAGNOSIS — I2510 Atherosclerotic heart disease of native coronary artery without angina pectoris: Secondary | ICD-10-CM | POA: Diagnosis present

## 2015-11-08 DIAGNOSIS — Z9114 Patient's other noncompliance with medication regimen: Secondary | ICD-10-CM

## 2015-11-08 LAB — BASIC METABOLIC PANEL
ANION GAP: 9 (ref 5–15)
BUN: 23 mg/dL — ABNORMAL HIGH (ref 6–20)
CALCIUM: 8.3 mg/dL — AB (ref 8.9–10.3)
CO2: 29 mmol/L (ref 22–32)
Chloride: 94 mmol/L — ABNORMAL LOW (ref 101–111)
Creatinine, Ser: 1.6 mg/dL — ABNORMAL HIGH (ref 0.61–1.24)
GFR, EST AFRICAN AMERICAN: 53 mL/min — AB (ref >60)
GFR, EST NON AFRICAN AMERICAN: 46 mL/min — AB (ref >60)
Glucose, Bld: 375 mg/dL — ABNORMAL HIGH (ref 65–99)
POTASSIUM: 3.8 mmol/L (ref 3.5–5.1)
Sodium: 132 mmol/L — ABNORMAL LOW (ref 135–145)

## 2015-11-08 LAB — CBC
HEMATOCRIT: 43.9 % (ref 39.0–52.0)
HEMOGLOBIN: 13.4 g/dL (ref 13.0–17.0)
MCH: 25 pg — ABNORMAL LOW (ref 26.0–34.0)
MCHC: 30.5 g/dL (ref 30.0–36.0)
MCV: 82.1 fL (ref 78.0–100.0)
Platelets: 150 10*3/uL (ref 150–400)
RBC: 5.35 MIL/uL (ref 4.22–5.81)
RDW: 18.3 % — ABNORMAL HIGH (ref 11.5–15.5)
WBC: 7.3 10*3/uL (ref 4.0–10.5)

## 2015-11-08 LAB — I-STAT TROPONIN, ED: TROPONIN I, POC: 0.02 ng/mL (ref 0.00–0.08)

## 2015-11-08 LAB — GLUCOSE, CAPILLARY: GLUCOSE-CAPILLARY: 301 mg/dL — AB (ref 65–99)

## 2015-11-08 LAB — BRAIN NATRIURETIC PEPTIDE: B NATRIURETIC PEPTIDE 5: 748.2 pg/mL — AB (ref 0.0–100.0)

## 2015-11-08 MED ORDER — PANTOPRAZOLE SODIUM 40 MG PO TBEC
40.0000 mg | DELAYED_RELEASE_TABLET | Freq: Every day | ORAL | Status: DC
  Administered 2015-11-09 – 2015-11-14 (×6): 40 mg via ORAL
  Filled 2015-11-08 (×6): qty 1

## 2015-11-08 MED ORDER — ISOSORB DINITRATE-HYDRALAZINE 20-37.5 MG PO TABS
1.0000 | ORAL_TABLET | Freq: Two times a day (BID) | ORAL | Status: DC
  Administered 2015-11-09 – 2015-11-14 (×12): 1 via ORAL
  Filled 2015-11-08 (×12): qty 1

## 2015-11-08 MED ORDER — TRAMADOL HCL 50 MG PO TABS
50.0000 mg | ORAL_TABLET | Freq: Three times a day (TID) | ORAL | Status: DC | PRN
  Administered 2015-11-09 – 2015-11-12 (×4): 50 mg via ORAL
  Filled 2015-11-08 (×4): qty 1

## 2015-11-08 MED ORDER — MOMETASONE FURO-FORMOTEROL FUM 100-5 MCG/ACT IN AERO
2.0000 | INHALATION_SPRAY | Freq: Two times a day (BID) | RESPIRATORY_TRACT | Status: DC
  Administered 2015-11-09 – 2015-11-14 (×11): 2 via RESPIRATORY_TRACT
  Filled 2015-11-08: qty 8.8

## 2015-11-08 MED ORDER — CARVEDILOL 3.125 MG PO TABS
3.1250 mg | ORAL_TABLET | Freq: Two times a day (BID) | ORAL | Status: DC
  Administered 2015-11-09 – 2015-11-14 (×11): 3.125 mg via ORAL
  Filled 2015-11-08 (×12): qty 1

## 2015-11-08 MED ORDER — FUROSEMIDE 10 MG/ML IJ SOLN
80.0000 mg | Freq: Once | INTRAMUSCULAR | Status: AC
  Administered 2015-11-08: 80 mg via INTRAVENOUS
  Filled 2015-11-08: qty 8

## 2015-11-08 MED ORDER — RIVAROXABAN 20 MG PO TABS
20.0000 mg | ORAL_TABLET | Freq: Every day | ORAL | Status: DC
  Administered 2015-11-09 – 2015-11-13 (×6): 20 mg via ORAL
  Filled 2015-11-08 (×6): qty 1

## 2015-11-08 MED ORDER — FUROSEMIDE 10 MG/ML IJ SOLN
40.0000 mg | Freq: Four times a day (QID) | INTRAMUSCULAR | Status: DC
  Administered 2015-11-09 (×2): 40 mg via INTRAVENOUS
  Filled 2015-11-08 (×2): qty 4

## 2015-11-08 MED ORDER — SODIUM CHLORIDE 0.9 % IV SOLN: 250.0000 mL | INTRAVENOUS | Status: DC | PRN

## 2015-11-08 MED ORDER — SODIUM CHLORIDE 0.9% FLUSH: 3.0000 mL | INTRAVENOUS | Status: DC | PRN

## 2015-11-08 MED ORDER — AMIODARONE HCL 200 MG PO TABS
200.0000 mg | ORAL_TABLET | Freq: Two times a day (BID) | ORAL | Status: DC
  Administered 2015-11-09 – 2015-11-14 (×12): 200 mg via ORAL
  Filled 2015-11-08 (×12): qty 1

## 2015-11-08 MED ORDER — ACETAMINOPHEN 325 MG PO TABS: 650.0000 mg | ORAL_TABLET | ORAL | Status: DC | PRN

## 2015-11-08 MED ORDER — LISINOPRIL 2.5 MG PO TABS: 2.5000 mg | ORAL_TABLET | Freq: Every day | ORAL | Status: DC

## 2015-11-08 MED ORDER — SODIUM CHLORIDE 0.9% FLUSH
3.0000 mL | Freq: Two times a day (BID) | INTRAVENOUS | Status: DC
  Administered 2015-11-09 – 2015-11-13 (×9): 3 mL via INTRAVENOUS

## 2015-11-08 MED ORDER — SPIRONOLACTONE 25 MG PO TABS
25.0000 mg | ORAL_TABLET | Freq: Every day | ORAL | Status: DC
  Administered 2015-11-09 – 2015-11-14 (×6): 25 mg via ORAL
  Filled 2015-11-08 (×6): qty 1

## 2015-11-08 MED ORDER — INSULIN ASPART 100 UNIT/ML ~~LOC~~ SOLN
0.0000 [IU] | Freq: Three times a day (TID) | SUBCUTANEOUS | Status: DC
Start: 2015-11-09 — End: 2015-11-14
  Administered 2015-11-09: 8 [IU] via SUBCUTANEOUS
  Administered 2015-11-09: 15 [IU] via SUBCUTANEOUS
  Administered 2015-11-10 – 2015-11-11 (×4): 5 [IU] via SUBCUTANEOUS
  Administered 2015-11-11: 11 [IU] via SUBCUTANEOUS
  Administered 2015-11-11 – 2015-11-12 (×2): 3 [IU] via SUBCUTANEOUS
  Administered 2015-11-12: 5 [IU] via SUBCUTANEOUS
  Administered 2015-11-12 – 2015-11-13 (×2): 3 [IU] via SUBCUTANEOUS
  Administered 2015-11-13 – 2015-11-14 (×4): 5 [IU] via SUBCUTANEOUS

## 2015-11-08 MED ORDER — ALBUTEROL SULFATE (2.5 MG/3ML) 0.083% IN NEBU: 2.5000 mg | INHALATION_SOLUTION | Freq: Four times a day (QID) | RESPIRATORY_TRACT | Status: DC | PRN

## 2015-11-08 MED ORDER — DIGOXIN 125 MCG PO TABS
0.0625 mg | ORAL_TABLET | Freq: Every day | ORAL | Status: DC
  Administered 2015-11-09 – 2015-11-14 (×6): 0.0625 mg via ORAL
  Filled 2015-11-08 (×6): qty 1

## 2015-11-08 MED ORDER — ONDANSETRON HCL 4 MG/2ML IJ SOLN: 4.0000 mg | Freq: Four times a day (QID) | INTRAMUSCULAR | Status: DC | PRN

## 2015-11-08 MED ORDER — FERROUS SULFATE 325 (65 FE) MG PO TABS
325.0000 mg | ORAL_TABLET | Freq: Every day | ORAL | Status: DC
  Administered 2015-11-09 – 2015-11-14 (×6): 325 mg via ORAL
  Filled 2015-11-08 (×6): qty 1

## 2015-11-08 MED ORDER — ATORVASTATIN CALCIUM 40 MG PO TABS
40.0000 mg | ORAL_TABLET | Freq: Every day | ORAL | Status: DC
Start: 2015-11-09 — End: 2015-11-14
  Administered 2015-11-09 – 2015-11-13 (×5): 40 mg via ORAL
  Filled 2015-11-08 (×5): qty 1

## 2015-11-08 MED ORDER — TORSEMIDE 20 MG PO TABS: 80.0000 mg | ORAL_TABLET | Freq: Two times a day (BID) | ORAL | Status: DC

## 2015-11-08 NOTE — ED Provider Notes (Signed)
[facility]-EMERGENCY DEPT Provider Note   CSN: [id number] Arrival date & time: 11/08/15  1419   History   Chief Complaint Chief Complaint  Patient presents with  . Shortness of Breath    HPI Jacob [name] is a 60 y.o. male.  The history is provided by the patient. No language interpreter was used.  Shortness of Breath  This is a new problem. The average episode lasts 1 week. The problem occurs continuously.The current episode started more than 2 days ago. The problem has been gradually worsening. Associated symptoms include cough, PND, orthopnea and leg swelling. Pertinent negatives include no fever, no headaches, no sore throat, no ear pain, no sputum production, no chest pain, no syncope, no vomiting, no abdominal pain and no rash. The treatment provided no relief. He has had prior hospitalizations. He has had prior ED visits. Associated medical issues include heart failure. Associated medical issues do not include asthma or DVT.    Past Medical History:  Diagnosis Date  . Atrial fibrillation (HCC)    RVR 10/2014  . Automatic implantable cardioverter-defibrillator in situ   . CAD (coronary artery disease) Sept 2013   s/p cardiac cath showing occlusion of small RCA with collaterals  . CHF (congestive heart failure) (HCC)    20 to 25 % EF and RV dysfunction by 07/2014 echo   . Chronic anticoagulation    on xarelto.   . High cholesterol   . Hypertension   . Myocardial infarction (HCC) 2014  . Noncompliance    homelessness contributing.   . Peripheral arterial disease (HCC)   . Type II diabetes mellitus [facility])     Patient Active Problem List   Diagnosis Date Noted  . CHF (congestive heart failure) (HCC) 11/08/2015  . Insomnia 04/18/2015  . Claudication of right lower extremity (HCC) 04/07/2015  . Cardiomyopathy, ischemic 04/07/2015  . Chronic anticoagulation 03/30/2015  . Acute on chronic systolic (congestive) heart failure (HCC)   . Cardiorenal syndrome with renal failure   . SOB  (shortness of breath)   . Morbid obesity due to excess calories (HCC)   . Atrial fibrillation with RVR (HCC) 03/14/2015  . Duodenal ulcer 12/12/2014  . Helicobacter pylori ab+ 12/12/2014  . Blood pressure alteration   . Calf pain   . Duodenal ulcer with hemorrhage   . GI bleed 11/27/2014  . Acute blood loss anemia   . Hemorrhagic shock   . Acute respiratory distress (HCC)   . Cardiogenic shock (HCC)   . Acute on chronic systolic congestive heart failure (HCC)   . Acute on chronic heart failure (HCC) 11/23/2014  . Acute on chronic combined systolic and diastolic CHF (congestive heart failure) (HCC) 11/23/2014  . Atrial fibrillation, unspecified   . Shortness of breath   . Other chest pain   . Acute on chronic systolic CHF (congestive heart failure), NYHA class 3 (HCC)   . Coronary artery disease due to lipid rich plaque   . Type 2 diabetes mellitus with other circulatory complications (HCC)   . Coronary artery disease involving native coronary artery of native heart without angina pectoris   . Atrial fibrillation with rapid ventricular response (HCC) 10/29/2014  . Acute exacerbation of CHF (congestive heart failure) (HCC) 10/29/2014  . Acute combined systolic and diastolic congestive heart failure (HCC)   . Chest pain   . Dyspnea 10/11/2014  . Chronic atrial fibrillation (HCC) 09/28/2014  . Atherosclerosis of native arteries of extremity with intermittent claudication (HCC) 09/06/2014  . Tobacco use disorder 08/29/2014  .  Atrial fibrillation with controlled ventricular response (HCC) 08/03/2014  . Acute respiratory failure (HCC) 08/03/2014  . Acute on chronic systolic CHF (congestive heart failure) (HCC) 08/03/2014  . Bacteremia   . DM (diabetes mellitus), type 2 with peripheral vascular complications (HCC) 08/01/2014  . Anemia of chronic disease 08/01/2014  . PVD (peripheral vascular disease) (HCC) 10/29/2013  . Tobacco abuse 05/13/2013  . AF (atrial fibrillation) (HCC)  02/11/2013  . ICD - in place- BS May 2014 [facility] 02/11/2013  . Microcytic anemia 01/11/2013  . CAD- s/p CABG July 2014 [facility] 12/04/2011  . Chronic combined systolic and diastolic heart failure (HCC) 12/01/2011  . Noncompliance 12/01/2011  . Essential hypertension 10/30/2006    Past Surgical History:  Procedure Laterality Date  . CARDIAC CATHETERIZATION  09/2012  . CORONARY ANGIOPLASTY WITH STENT PLACEMENT  11/2011   "1"  . CORONARY ARTERY BYPASS GRAFT  09/2012   2 vessels per patient [name]   . ESOPHAGOGASTRODUODENOSCOPY N/A 11/28/2014   Procedure: ESOPHAGOGASTRODUODENOSCOPY (EGD);  Surgeon: Beverley Fiedler, MD;  Location: [facility] ENDOSCOPY;  Service: Endoscopy;  Laterality: N/A;  . ILIAC ARTERY STENT Right 08/30/2013  . IMPLANTABLE CARDIOVERTER DEFIBRILLATOR IMPLANT     [place] in 07/2012; [employer]  . LEFT AND RIGHT HEART CATHETERIZATION WITH CORONARY ANGIOGRAM N/A 12/02/2011   Procedure: LEFT AND RIGHT HEART CATHETERIZATION WITH CORONARY ANGIOGRAM;  Surgeon: Kathleene Hazel, MD;  Location: [facility] CATH LAB;  Service: Cardiovascular;  Laterality: N/A;  . LOWER EXTREMITY ANGIOGRAM N/A 08/30/2013   Procedure: LOWER EXTREMITY ANGIOGRAM;  Surgeon: Runell Gess, MD;  Location: [facility] CATH LAB;  Service: Cardiovascular;  Laterality: N/A;  . LOWER EXTREMITY ANGIOGRAM N/A 12/02/2013   Procedure: LOWER EXTREMITY ANGIOGRAM;  Surgeon: Runell Gess, MD;  Location: [facility] CATH LAB;  Service: Cardiovascular;  Laterality: N/A;       Home Medications    Prior to Admission medications   Medication Sig Start Date End Date Taking? Authorizing Provider  albuterol (PROVENTIL HFA;VENTOLIN HFA) 108 (90 Base) MCG/ACT inhaler Inhale 2 puffs into the lungs every 6 (six) hours as needed for wheezing or shortness of breath. 03/29/15   Jaclyn Shaggy, MD  albuterol (PROVENTIL) (2.5 MG/3ML) 0.083% nebulizer solution Take 3 mLs (2.5 mg total) by nebulization every 6 (six) hours as needed for wheezing or  shortness of breath. 04/18/15   Jaclyn Shaggy, MD  amiodarone (PACERONE) 200 MG tablet Take 1 tablet (200 mg total) by mouth 2 (two) times daily. 09/18/15   Amy D Filbert Schilder, NP  atorvastatin (LIPITOR) 40 MG tablet Take 1 tablet (40 mg total) by mouth daily at 6 PM. 09/18/15   Amy D Clegg, NP  Blood Glucose Monitoring Suppl (ACCU-CHEK AVIVA) device Use as instructed 3 times daily before meals 04/18/15   Jaclyn Shaggy, MD  carvedilol (COREG) 3.125 MG tablet Take 1 tablet (3.125 mg total) by mouth 2 (two) times daily with a meal. 09/18/15   Amy D Clegg, NP  digoxin (LANOXIN) 0.125 MG tablet Take 0.5 tablets (0.0625 mg total) by mouth daily. 09/18/15   Amy D Clegg, NP  ferrous sulfate 325 (65 FE) MG EC tablet Take 325 mg by mouth daily with breakfast.     Historical Provider, MD  Fluticasone-Salmeterol (ADVAIR) 100-50 MCG/DOSE AEPB Inhale 1 puff into the lungs daily.    Historical Provider, MD  glipiZIDE (GLUCOTROL) 10 MG tablet Take 1 tablet (10 mg total) by mouth 2 (two) times daily before a meal. 04/04/15   Jaclyn Shaggy, MD  isosorbide-hydrALAZINE (  BIDIL) 20-37.5 MG tablet Take 1 tablet by mouth 2 (two) times daily. 09/18/15   Amy D Clegg, NP  pantoprazole (PROTONIX) 40 MG tablet Take 40 mg by mouth daily as needed.    Historical Provider, MD  rivaroxaban (XARELTO) 20 MG TABS tablet Take 1 tablet (20 mg total) by mouth daily before supper. 09/18/15   Amy D Filbert Schilderlegg, NP  spironolactone (ALDACTONE) 25 MG tablet Take 1 tablet (25 mg total) by mouth daily. 09/18/15   Amy D Clegg, NP  torsemide (DEMADEX) 20 MG tablet Take 4 tablets (80 mg total) by mouth 2 (two) times daily. 09/18/15   Amy D Filbert Schilderlegg, NP  traMADol (ULTRAM) 50 MG tablet Take 1 tablet (50 mg total) by mouth every 8 (eight) hours as needed for moderate pain. 01/30/15   Jaclyn ShaggyEnobong Amao, MD    Family History Family History  Problem Relation Age of Onset  . Diabetes Mother     Social History Social History  Substance Use Topics  . Smoking status: Current Every  Day Smoker    Packs/day: 1.00    Years: 40.00    Types: Cigarettes  . Smokeless tobacco: Never Used  . Alcohol use No     Allergies   Tape   Review of Systems Review of Systems  Constitutional: Negative for chills and fever.  HENT: Negative for ear pain and sore throat.   Eyes: Negative for pain and visual disturbance.  Respiratory: Positive for cough and shortness of breath. Negative for sputum production.   Cardiovascular: Positive for orthopnea, leg swelling and PND. Negative for chest pain, palpitations and syncope.  Gastrointestinal: Negative for abdominal pain and vomiting.  Genitourinary: Negative for dysuria and hematuria.  Musculoskeletal: Negative for arthralgias and back pain.  Skin: Negative for color change and rash.  Neurological: Negative for seizures, syncope and headaches.  All other systems reviewed and are negative.    Physical Exam Updated Vital Signs BP 141/83   Pulse 117   Temp 98.6 F (37 C) (Oral)   Resp (!) 32   Wt 107 kg   SpO2 90%   BMI 40.47 kg/m   Physical Exam  Constitutional: He appears well-developed and well-nourished.  HENT:  Head: Normocephalic and atraumatic.  Eyes: Conjunctivae are normal.  Neck: Neck supple. JVD present.  Cardiovascular: An irregularly irregular rhythm present. Tachycardia present.   No murmur heard. Pulmonary/Chest: Effort normal. No respiratory distress. He has rales.  Abdominal: Soft. He exhibits distension. There is no tenderness.  Musculoskeletal: He exhibits edema.  Neurological: He is alert.  Skin: Skin is warm and dry.  Psychiatric: He has a normal mood and affect.  Nursing note and vitals reviewed.    ED Treatments / Results  Labs (all labs ordered are listed, but only abnormal results are displayed) Labs Reviewed  BASIC METABOLIC PANEL - Abnormal; Notable for the following:       Result Value   Sodium 132 (*)    Chloride 94 (*)    Glucose, Bld 375 (*)    BUN 23 (*)    Creatinine, Ser  1.60 (*)    Calcium 8.3 (*)    GFR calc non Af Amer 46 (*)    GFR calc Af Amer 53 (*)    All other components within normal limits  CBC - Abnormal; Notable for the following:    MCH 25.0 (*)    RDW 18.3 (*)    All other components within normal limits  BRAIN NATRIURETIC PEPTIDE - Abnormal; Notable  for the following:    B Natriuretic Peptide 748.2 (*)    All other components within normal limits  I-STAT TROPOININ, ED    EKG  EKG Interpretation  Date/Time:  Wednesday November 08 2015 14:32:32 EDT Ventricular Rate:  112 PR Interval:    QRS Duration: 106 QT Interval:  370 QTC Calculation: 505 R Axis:   -82 Text Interpretation:  Atrial fibrillation with rapid ventricular response with premature ventricular or aberrantly conducted complexes Left anterior fascicular block Nonspecific T wave abnormality Abnormal ECG Confirmed by Particia Nearing MD, JULIE ([id number]) on 11/08/2015 6:29:20 PM       Radiology Dg Chest 2 View  Result Date: 11/08/2015 CLINICAL DATA:  Shortness of breath abdominal fluid retention. EXAM: CHEST  2 VIEW COMPARISON:  Two-view chest x-ray 09/13/2015. FINDINGS: Cardiomegaly is stable. Mild edema is present. Right greater left pleural effusions and airspace disease are present. Pacing/defibrillator wire is stable. IMPRESSION: 1. Congestive heart failure. 2. Right greater than left basilar airspace disease. While this likely reflects atelectasis, infection or aspiration is also considered. Electronically Signed   By: Marin Roberts M.D.   On: 11/08/2015 16:13    Procedures Procedures (including critical care time)  Medications Ordered in ED Medications  furosemide (LASIX) injection 80 mg (80 mg Intravenous Given 11/08/15 2100)     Initial Impression / Assessment and Plan / ED Course  I have reviewed the triage vital signs and the nursing notes.  Pertinent labs & imaging results that were available during my care of the patient were reviewed by me and considered in  my medical decision making (see chart for details).  Clinical Course    Patient with history of systolic heart failure, ejection fraction 30-35% since her evaluation of one week of worsening shortness of breath, orthopnea, dyspnea on exertion, leg swelling. He has gained roughly 15 pounds over the last 1-2 weeks. He reports compliance with his Lasix at home however not taking any of his other medications.  Upon arrival patient afebrile, heart rate irregularly irregular (history of atrial fibrillation), JVD, 2+ pitting edema to his lower extremities.  Chest x-ray consistent with CHF exacerbation, BNP elevated. 80 milligrams IV Lasix ordered.  Patient on room air, satting 92%.  Discussed case with hospitalist who will admit to telemetry observation bed. Hospitalist, Dr. Ophelia Charter saw patient in emergency department.  Discussed case with my attending, Dr. Particia Nearing.    Final Clinical Impressions(s) / ED Diagnoses   Final diagnoses:  Systolic congestive heart failure, unspecified congestive heart failure chronicity [facility])    New Prescriptions New Prescriptions   No medications on file     Dan Humphreys, MD 11/08/15 2240    Jacalyn Lefevre, MD 11/08/15 [phone]

## 2015-11-08 NOTE — ED Triage Notes (Signed)
Pt c/o SOB with abd fluid retention, pt reports taking 100 mg Lasix daily, pt denies n/v/d, pt denies cough,  pt A&O x4

## 2015-11-08 NOTE — ED Notes (Signed)
Attempted Report x2. 

## 2015-11-08 NOTE — ED Notes (Signed)
Pt made aware of bed assignment 

## 2015-11-08 NOTE — ED Notes (Signed)
Attempted report 

## 2015-11-08 NOTE — H&P (Addendum)
History and Physical    Jacob [name] CBJ:[id number] DOB: [birth year] DOA: 11/08/2015  PCP: Bella Kennedy Consultants:  Sampson Goon - cardiology Patient coming from: home - lives alone; NOKShari Heritage [place], [phone]  Chief Complaint: edema, SOB  HPI: Jacob [name] is a 60 y.o. male with medical history significant of CAD, afib, HFrEF presenting with apparent CHF exacerbation.  Patient was last seen in the ER on 09/13/15 with similar symptoms and refused admission at that time.  He presents today with c/o diffuse edema - body, face, feet, lungs.  Unable to eat due to lack of appetite.  Unable to lie flat, sleeps on 1-2 pillows on belly.  Occasional PND.  +SOB.  Significant DOE.  Feels too weak to walk around.  Has had symptoms since he had open heart surgery but worse over last 3-4 weeks.  Ran out of medications 2-3 weeks ago, unable to afford medications despite disability (only gets $600/month).     ED Course:  Per Dr. Moody Bruins: Patient with history of systolic heart failure, ejection fraction 30-35% since her evaluation of one week of worsening shortness of breath, orthopnea, dyspnea on exertion, leg swelling. He has gained roughly 15 pounds over the last 1-2 weeks. He reports compliance with his Lasix at home however not taking any of his other medications.  Upon arrival patient afebrile, heart rate irregularly irregular (history of atrial fibrillation), JVD, 2+ pitting edema to his lower extremities.  Chest x-ray consistent with CHF exacerbation, BNP elevated. 80 milligrams IV Lasix ordered.  Patient on room air, satting 92%.  Discussed case with hospitalist who will admit to telemetry observation bed. Hospitalist, Dr. Ophelia Charter saw patient in emergency department.  Review of Systems: As per HPI; otherwise 10 point review of systems reviewed and negative.   Ambulatory Status:  Ambulates without assistance when not too weak  Past Medical History:  Diagnosis Date  . Atrial fibrillation (HCC)    RVR 10/2014  .  Automatic implantable cardioverter-defibrillator in situ   . CAD (coronary artery disease) Sept 2013   s/p cardiac cath showing occlusion of small RCA with collaterals  . CHF (congestive heart failure) (HCC)    20 to 25 % EF and RV dysfunction by 07/2014 echo   . Chronic anticoagulation    on xarelto.   . High cholesterol   . Hypertension   . Myocardial infarction (HCC) 2014  . Noncompliance    homelessness contributing.   . Peripheral arterial disease (HCC)   . Type II diabetes mellitus (HCC)     Past Surgical History:  Procedure Laterality Date  . CARDIAC CATHETERIZATION  09/2012  . CORONARY ANGIOPLASTY WITH STENT PLACEMENT  11/2011   "1"  . CORONARY ARTERY BYPASS GRAFT  09/2012   2 vessels per patient [name]   . ESOPHAGOGASTRODUODENOSCOPY N/A 11/28/2014   Procedure: ESOPHAGOGASTRODUODENOSCOPY (EGD);  Surgeon: Beverley Fiedler, MD;  Location: [facility] ENDOSCOPY;  Service: Endoscopy;  Laterality: N/A;  . ILIAC ARTERY STENT Right 08/30/2013  . IMPLANTABLE CARDIOVERTER DEFIBRILLATOR IMPLANT     [place] in 07/2012; [employer]  . LEFT AND RIGHT HEART CATHETERIZATION WITH CORONARY ANGIOGRAM N/A 12/02/2011   Procedure: LEFT AND RIGHT HEART CATHETERIZATION WITH CORONARY ANGIOGRAM;  Surgeon: Kathleene Hazel, MD;  Location: [facility] CATH LAB;  Service: Cardiovascular;  Laterality: N/A;  . LOWER EXTREMITY ANGIOGRAM N/A 08/30/2013   Procedure: LOWER EXTREMITY ANGIOGRAM;  Surgeon: Runell Gess, MD;  Location: [facility] CATH LAB;  Service: Cardiovascular;  Laterality: N/A;  . LOWER EXTREMITY ANGIOGRAM N/A 12/02/2013  Procedure: LOWER EXTREMITY ANGIOGRAM;  Surgeon: Runell Gess, MD;  Location: [facility] CATH LAB;  Service: Cardiovascular;  Laterality: N/A;    Social History   Social History  . Marital status: Divorced    Spouse name: N/A  . Number of children: 3  . Years of education: N/A   Occupational History  . disabled    Social History Main Topics  . Smoking status: Current Every Day Smoker     Packs/day: 1.00    Years: 40.00    Types: Cigarettes  . Smokeless tobacco: Never Used  . Alcohol use No  . Drug use: No  . Sexual activity: No   Other Topics Concern  . Not on file   Social History Narrative   Has an apartment with a roommate. He was living on the streets in 2013/01/19.  He reports that his father died in [place] in Jan 19, 2013.  He is divorced.  He is no longer estranged from his son, but still from his daughter who lives locally.  Neither of his parents, nor any siblings have any history of CAD.    Allergies  Allergen Reactions  . Tape Itching    Paper tape please.    Family History  Problem Relation Age of Onset  . Diabetes Mother     Prior to Admission medications   Medication Sig Start Date End Date Taking? Authorizing Provider  albuterol (PROVENTIL HFA;VENTOLIN HFA) 108 (90 Base) MCG/ACT inhaler Inhale 2 puffs into the lungs every 6 (six) hours as needed for wheezing or shortness of breath. 03/29/15   Jaclyn Shaggy, MD  albuterol (PROVENTIL) (2.5 MG/3ML) 0.083% nebulizer solution Take 3 mLs (2.5 mg total) by nebulization every 6 (six) hours as needed for wheezing or shortness of breath. 04/18/15   Jaclyn Shaggy, MD  amiodarone (PACERONE) 200 MG tablet Take 1 tablet (200 mg total) by mouth 2 (two) times daily. 09/18/15   Amy D Filbert Schilder, NP  atorvastatin (LIPITOR) 40 MG tablet Take 1 tablet (40 mg total) by mouth daily at 6 PM. 09/18/15   Amy D Clegg, NP  Blood Glucose Monitoring Suppl (ACCU-CHEK AVIVA) device Use as instructed 3 times daily before meals 04/18/15   Jaclyn Shaggy, MD  carvedilol (COREG) 3.125 MG tablet Take 1 tablet (3.125 mg total) by mouth 2 (two) times daily with a meal. 09/18/15   Amy D Clegg, NP  digoxin (LANOXIN) 0.125 MG tablet Take 0.5 tablets (0.0625 mg total) by mouth daily. 09/18/15   Amy D Clegg, NP  ferrous sulfate 325 (65 FE) MG EC tablet Take 325 mg by mouth daily with breakfast.     Historical Provider, MD  Fluticasone-Salmeterol (ADVAIR)  100-50 MCG/DOSE AEPB Inhale 1 puff into the lungs daily.    Historical Provider, MD  glipiZIDE (GLUCOTROL) 10 MG tablet Take 1 tablet (10 mg total) by mouth 2 (two) times daily before a meal. 04/04/15   Jaclyn Shaggy, MD  isosorbide-hydrALAZINE (BIDIL) 20-37.5 MG tablet Take 1 tablet by mouth 2 (two) times daily. 09/18/15   Amy D Clegg, NP  pantoprazole (PROTONIX) 40 MG tablet Take 40 mg by mouth daily as needed.    Historical Provider, MD  rivaroxaban (XARELTO) 20 MG TABS tablet Take 1 tablet (20 mg total) by mouth daily before supper. 09/18/15   Amy D Filbert Schilder, NP  spironolactone (ALDACTONE) 25 MG tablet Take 1 tablet (25 mg total) by mouth daily. 09/18/15   Amy D Clegg, NP  torsemide (DEMADEX) 20 MG tablet Take 4 tablets (  80 mg total) by mouth 2 (two) times daily. 09/18/15   Amy D Filbert Schilder, NP  traMADol (ULTRAM) 50 MG tablet Take 1 tablet (50 mg total) by mouth every 8 (eight) hours as needed for moderate pain. 01/30/15   Jaclyn Shaggy, MD    Physical Exam: Vitals:   11/08/15 2100 11/08/15 2139 11/08/15 2145 11/08/15 2342  BP: 140/98 140/98 141/83 (!) 129/94  Pulse: 115  117 (!) 121  Resp: 20  (!) 32 18  Temp:    97.8 F (36.6 C)  TempSrc:    Oral  SpO2: 95% 99% 90% 93%  Weight:    106.9 kg (235 lb 11.2 oz)  Height:    5\' 3"  (1.6 m)     General: Appears uncomfortable with increased respiratory effort, difficulty getting comfortable while lying in bed and requesting to get OOB to chair Eyes:  PERRL, EOMI, normal lids, iris ENT:  grossly normal hearing, lips & tongue, mmm Neck:  no LAD, masses or thyromegaly; JVD present Cardiovascular:  tachycardia, no m/r/g. 2+ LE edema.  Respiratory: Scant bibasilar crackles appreciated, no w/r/r. Normal respiratory effort. Abdomen:  soft, nt, appears to have ascites with fluid wave, NABS Skin:  no rash or induration seen on limited exam Musculoskeletal:  grossly normal tone BUE/BLE, good ROM, no bony abnormality, +evidence of chronic venous  stasis Psychiatric:  grossly normal mood and affect, speech fluent and appropriate, AOx3 Neurologic:  CN 2-12 grossly intact, moves all extremities in coordinated fashion, sensation intact  Labs on Admission: I have personally reviewed following labs and imaging studies  CBC:  Recent Labs Lab 11/08/15 1450  WBC 7.3  HGB 13.4  HCT 43.9  MCV 82.1  PLT 150   Basic Metabolic Panel:  Recent Labs Lab 11/08/15 1450  NA 132*  K 3.8  CL 94*  CO2 29  GLUCOSE 375*  BUN 23*  CREATININE 1.60*  CALCIUM 8.3*   GFR: Estimated Creatinine Clearance: 54.7 mL/min (by C-G formula based on SCr of 1.6 mg/dL). Liver Function Tests: No results for input(s): AST, ALT, ALKPHOS, BILITOT, PROT, ALBUMIN in the last 168 hours. No results for input(s): LIPASE, AMYLASE in the last 168 hours. No results for input(s): AMMONIA in the last 168 hours. Coagulation Profile: No results for input(s): INR, PROTIME in the last 168 hours. Cardiac Enzymes: No results for input(s): CKTOTAL, CKMB, CKMBINDEX, TROPONINI in the last 168 hours. BNP (last 3 results)  Recent Labs  12/12/14 1614  PROBNP 1,892.00*   HbA1C: No results for input(s): HGBA1C in the last 72 hours. CBG:  Recent Labs Lab 11/08/15 2358  GLUCAP 301*   Lipid Profile: No results for input(s): CHOL, HDL, LDLCALC, TRIG, CHOLHDL, LDLDIRECT in the last 72 hours. Thyroid Function Tests: No results for input(s): TSH, T4TOTAL, FREET4, T3FREE, THYROIDAB in the last 72 hours. Anemia Panel: No results for input(s): VITAMINB12, FOLATE, FERRITIN, TIBC, IRON, RETICCTPCT in the last 72 hours. Urine analysis:    Component Value Date/Time   COLORURINE YELLOW 12/01/2014 1300   APPEARANCEUR CLEAR 12/01/2014 1300   LABSPEC 1.009 12/01/2014 1300   PHURINE 5.5 12/01/2014 1300   GLUCOSEU NEGATIVE 12/01/2014 1300   HGBUR NEGATIVE 12/01/2014 1300   BILIRUBINUR NEGATIVE 12/01/2014 1300   KETONESUR NEGATIVE 12/01/2014 1300   PROTEINUR NEGATIVE  12/01/2014 1300   UROBILINOGEN 1.0 12/01/2014 1300   NITRITE NEGATIVE 12/01/2014 1300   LEUKOCYTESUR NEGATIVE 12/01/2014 1300    Creatinine Clearance: Estimated Creatinine Clearance: 54.7 mL/min (by C-G formula based on SCr of 1.6  mg/dL).  Sepsis Labs: @LABRCNTIP (procalcitonin:4,lacticidven:4) )No results found for this or any previous visit (from the past 240 hour(s)).   Radiological Exams on Admission: Dg Chest 2 View  Result Date: 11/08/2015 CLINICAL DATA:  Shortness of breath abdominal fluid retention. EXAM: CHEST  2 VIEW COMPARISON:  Two-view chest x-ray 09/13/2015. FINDINGS: Cardiomegaly is stable. Mild edema is present. Right greater left pleural effusions and airspace disease are present. Pacing/defibrillator wire is stable. IMPRESSION: 1. Congestive heart failure. 2. Right greater than left basilar airspace disease. While this likely reflects atelectasis, infection or aspiration is also considered. Electronically Signed   By: Marin Robertshristopher  Mattern M.D.   On: 11/08/2015 16:13    EKG: Independently reviewed.  Afib with RVR with rate 112; nonspecific ST changes with no evidence of acute ischemia  Assessment/Plan Principal Problem:   Acute on chronic combined systolic and diastolic CHF (congestive heart failure) (HCC) Active Problems:   Essential hypertension   Noncompliance   AF (atrial fibrillation) (HCC)   Tobacco abuse   DM (diabetes mellitus), type 2 with peripheral vascular complications (HCC)   Acute respiratory failure (HCC)   Coronary artery disease involving native coronary artery of native heart without angina pectoris   Ascites   AKI (acute kidney injury) (HCC)   HFrEF -Patient with markedly reduced EF on 9/16 echo - EF 30-35%; this was actually improved compared to prior -He has the appropriate medications ordered - Amio, Lipitor, Coreg, Dig, Xarelto - other than ACE (given mild AKI, will not add currently) -Unfortunately, regardless of how important these  medications are, he cannot afford to have them filled and so is non-compliant -Medication non-compliance is likely what led to this admission -Will place on observation status on telemetry floor and try to improve his exacerbation -Respiratory failure is 2* to CHF and is already improved from presentation following IV Lasix -Will continue IV Lasix and restart home medications -CHF order set implemented -Given patient's non-compliance, CHF physician consult is likely not warranted at this time (cause of exacerbation is known) -will request CM consult as well as SW consult to try to obtain more home support as well as medication assistance -He does have an AICD -On Demadex, Aldactone at home; may need to switch Torsemide to Furosemide for some cost savings -Repeat echo ordered  Afib -mild RVR on presentation which likely will improve with resumption of home medications -He does have Xarelto ordered but has not been taking it -Will resume Xarelto, but he either needs vouchering/med assistance or should transition back to the much cheaper Coumadin  HTN -Continue Coreg, Bidil -Not on ACE, consider adding once AKI is resolved  AKI -Likely resulting from decreased renal blood flow resulting from CHF/volume overload -Anticipate improvement with diuresis, will follow  DM -Suboptimal control as to be expected since he has not been taking medication -Hold Glucotrol -Cover with SSI  CAD -Continue Bidil and Lipitor -Does not appear to be taking ASA, will add  Ascites -Patient with apparent ascites - complains of abdominal edema and appears to have a fluid wave - although this could simply be pannus-associated -AP is elevated, T bili is slightly elevated -Will request abdominal ultrasound for further evaluation  Tobacco Tobacco Dependence: encourage cessation.  This was discussed with the patient and should be reviewed on an ongoing basis.   -Patch ordered.  DVT prophylaxis: Resume  Xarelto Code Status:Full - confirmed with patient Family Communication: None present Disposition Plan:  Home once clinically improved Consults called: None  Admission status: Observation status,  telemetry    Jonah Blue MD [facility]  If 7PM-7AM, please contact night-coverage [email or web address] Password [email or web address]  11/09/2015, 4:12 AM

## 2015-11-09 ENCOUNTER — Other Ambulatory Visit (HOSPITAL_COMMUNITY): Payer: Medicare Other

## 2015-11-09 ENCOUNTER — Observation Stay (HOSPITAL_COMMUNITY): Payer: Medicare Other

## 2015-11-09 DIAGNOSIS — I444 Left anterior fascicular block: Secondary | ICD-10-CM | POA: Diagnosis present

## 2015-11-09 DIAGNOSIS — R188 Other ascites: Secondary | ICD-10-CM | POA: Diagnosis not present

## 2015-11-09 DIAGNOSIS — I252 Old myocardial infarction: Secondary | ICD-10-CM | POA: Diagnosis not present

## 2015-11-09 DIAGNOSIS — I509 Heart failure, unspecified: Secondary | ICD-10-CM | POA: Diagnosis not present

## 2015-11-09 DIAGNOSIS — R0602 Shortness of breath: Secondary | ICD-10-CM | POA: Diagnosis not present

## 2015-11-09 DIAGNOSIS — I2510 Atherosclerotic heart disease of native coronary artery without angina pectoris: Secondary | ICD-10-CM | POA: Diagnosis not present

## 2015-11-09 DIAGNOSIS — J9601 Acute respiratory failure with hypoxia: Secondary | ICD-10-CM | POA: Diagnosis not present

## 2015-11-09 DIAGNOSIS — Z951 Presence of aortocoronary bypass graft: Secondary | ICD-10-CM | POA: Diagnosis not present

## 2015-11-09 DIAGNOSIS — Z590 Homelessness: Secondary | ICD-10-CM | POA: Diagnosis not present

## 2015-11-09 DIAGNOSIS — Z9114 Patient's other noncompliance with medication regimen: Secondary | ICD-10-CM | POA: Diagnosis not present

## 2015-11-09 DIAGNOSIS — Z7951 Long term (current) use of inhaled steroids: Secondary | ICD-10-CM | POA: Diagnosis not present

## 2015-11-09 DIAGNOSIS — N179 Acute kidney failure, unspecified: Secondary | ICD-10-CM | POA: Diagnosis not present

## 2015-11-09 DIAGNOSIS — K828 Other specified diseases of gallbladder: Secondary | ICD-10-CM | POA: Diagnosis not present

## 2015-11-09 DIAGNOSIS — Z720 Tobacco use: Secondary | ICD-10-CM

## 2015-11-09 DIAGNOSIS — F17210 Nicotine dependence, cigarettes, uncomplicated: Secondary | ICD-10-CM | POA: Diagnosis present

## 2015-11-09 DIAGNOSIS — I5043 Acute on chronic combined systolic (congestive) and diastolic (congestive) heart failure: Secondary | ICD-10-CM | POA: Diagnosis not present

## 2015-11-09 DIAGNOSIS — E1122 Type 2 diabetes mellitus with diabetic chronic kidney disease: Secondary | ICD-10-CM | POA: Diagnosis present

## 2015-11-09 DIAGNOSIS — D649 Anemia, unspecified: Secondary | ICD-10-CM | POA: Diagnosis present

## 2015-11-09 DIAGNOSIS — I130 Hypertensive heart and chronic kidney disease with heart failure and stage 1 through stage 4 chronic kidney disease, or unspecified chronic kidney disease: Secondary | ICD-10-CM | POA: Diagnosis present

## 2015-11-09 DIAGNOSIS — Z9119 Patient's noncompliance with other medical treatment and regimen: Secondary | ICD-10-CM | POA: Diagnosis not present

## 2015-11-09 DIAGNOSIS — I255 Ischemic cardiomyopathy: Secondary | ICD-10-CM | POA: Diagnosis present

## 2015-11-09 DIAGNOSIS — I5022 Chronic systolic (congestive) heart failure: Secondary | ICD-10-CM | POA: Diagnosis not present

## 2015-11-09 DIAGNOSIS — Z95810 Presence of automatic (implantable) cardiac defibrillator: Secondary | ICD-10-CM | POA: Diagnosis not present

## 2015-11-09 DIAGNOSIS — Z6841 Body Mass Index (BMI) 40.0 and over, adult: Secondary | ICD-10-CM | POA: Diagnosis not present

## 2015-11-09 DIAGNOSIS — Z7901 Long term (current) use of anticoagulants: Secondary | ICD-10-CM | POA: Diagnosis not present

## 2015-11-09 DIAGNOSIS — I480 Paroxysmal atrial fibrillation: Secondary | ICD-10-CM | POA: Diagnosis not present

## 2015-11-09 DIAGNOSIS — I482 Chronic atrial fibrillation: Secondary | ICD-10-CM | POA: Diagnosis not present

## 2015-11-09 DIAGNOSIS — E1151 Type 2 diabetes mellitus with diabetic peripheral angiopathy without gangrene: Secondary | ICD-10-CM | POA: Diagnosis not present

## 2015-11-09 DIAGNOSIS — Z955 Presence of coronary angioplasty implant and graft: Secondary | ICD-10-CM | POA: Diagnosis not present

## 2015-11-09 DIAGNOSIS — N183 Chronic kidney disease, stage 3 (moderate): Secondary | ICD-10-CM | POA: Diagnosis present

## 2015-11-09 DIAGNOSIS — I481 Persistent atrial fibrillation: Secondary | ICD-10-CM | POA: Diagnosis not present

## 2015-11-09 LAB — GLUCOSE, CAPILLARY
GLUCOSE-CAPILLARY: 101 mg/dL — AB (ref 65–99)
GLUCOSE-CAPILLARY: 254 mg/dL — AB (ref 65–99)
GLUCOSE-CAPILLARY: 273 mg/dL — AB (ref 65–99)
Glucose-Capillary: 356 mg/dL — ABNORMAL HIGH (ref 65–99)

## 2015-11-09 LAB — BASIC METABOLIC PANEL
Anion gap: 10 (ref 5–15)
BUN: 24 mg/dL — AB (ref 6–20)
CHLORIDE: 93 mmol/L — AB (ref 101–111)
CO2: 29 mmol/L (ref 22–32)
CREATININE: 1.55 mg/dL — AB (ref 0.61–1.24)
Calcium: 8.2 mg/dL — ABNORMAL LOW (ref 8.9–10.3)
GFR calc Af Amer: 55 mL/min — ABNORMAL LOW (ref >60)
GFR calc non Af Amer: 48 mL/min — ABNORMAL LOW (ref >60)
GLUCOSE: 355 mg/dL — AB (ref 65–99)
POTASSIUM: 3.6 mmol/L (ref 3.5–5.1)
Sodium: 132 mmol/L — ABNORMAL LOW (ref 135–145)

## 2015-11-09 MED ORDER — FUROSEMIDE 10 MG/ML IJ SOLN
40.0000 mg | Freq: Once | INTRAMUSCULAR | Status: AC
Start: 2015-11-09 — End: 2015-11-09
  Administered 2015-11-09: 40 mg via INTRAVENOUS
  Filled 2015-11-09: qty 4

## 2015-11-09 MED ORDER — NICOTINE 14 MG/24HR TD PT24
14.0000 mg | MEDICATED_PATCH | Freq: Every day | TRANSDERMAL | Status: DC
  Administered 2015-11-09 – 2015-11-14 (×6): 14 mg via TRANSDERMAL
  Filled 2015-11-09 (×6): qty 1

## 2015-11-09 MED ORDER — ASPIRIN EC 81 MG PO TBEC
81.0000 mg | DELAYED_RELEASE_TABLET | Freq: Every day | ORAL | Status: DC
  Administered 2015-11-09 – 2015-11-14 (×6): 81 mg via ORAL
  Filled 2015-11-09 (×6): qty 1

## 2015-11-09 MED ORDER — POTASSIUM CHLORIDE CRYS ER 20 MEQ PO TBCR
40.0000 meq | EXTENDED_RELEASE_TABLET | Freq: Once | ORAL | Status: AC
  Administered 2015-11-09: 40 meq via ORAL
  Filled 2015-11-09: qty 2

## 2015-11-09 MED ORDER — FUROSEMIDE 10 MG/ML IJ SOLN
80.0000 mg | Freq: Two times a day (BID) | INTRAMUSCULAR | Status: DC
  Administered 2015-11-09: 80 mg via INTRAVENOUS
  Filled 2015-11-09: qty 8

## 2015-11-09 NOTE — Progress Notes (Signed)
[facility] PROGRESS NOTE  Jacob [name] OTL:[id number] DOB: 03-26-1955 DOA: 11/08/2015 PCP: [name], MD  Today: Cont diuresis. ECHO. Rate control. Monitor AKI.  Assessment/Plan: HFrEF On admission patient with markedly reduced EF on 9/16 echo - EF 30-35%; this was actually improved compared to prior. He has the appropriate medications ordered - Amio, Lipitor, Coreg, Dig, Xarelto - other than ACE (given mild AKI, will not add currently) -Unfortunately, regardless of how important these medications are, he cannot afford to have them filled and so is non-compliant. Medication non-compliance is likely what led to this admission -Placed on observation status on telemetry floor and try to improve his exacerbation -Respiratory failure is 2* to CHF and is already improved from presentation following IV Lasix -Will continue IV Lasix and home medications -CHF order set implemented -CM and SW intervention appreciated  -He does have an AICD -On Demadex, Aldactone at home; may need to switch Torsemide to Furosemide for some cost savings -Repeat echo ordered-->pending  Afib -mild RVR on presentation which likely will improve with resumption of home medications -Will resume Xarelto, but he either needs vouchering/med assistance or should transition back to the much cheaper Coumadin -prn lopressor for RVR  HTN -Continue Coreg, Bidil -Not on ACE, consider adding once AKI is resolved  AKI -Likely resulting from decreased renal blood flow resulting from CHF/volume overload -Anticipate improvement with diuresis, will follow - baseline around 1.14, minimal improvement overnight  DM -Suboptimal control as to be expected since he has not been taking medication -Hold Glucotrol -Cover with SSI  CAD -Continue Bidil and Lipitor -Does not appear to be taking ASA, addded  Abd distension -On admission patient with apparent ascites - complains of abdominal edema and appears to have  a fluid wave - although this could simply be pannus-associated -AP is elevated, T bili is slightly elevated -No ascities on abdominal ultrasound  Tobacco Tobacco Dependence: encourage cessation.  This was discussed with the patient and should be reviewed on an ongoing basis.   -Patch ordered.  DVT prophylaxis: Resumed Xarelto Code Status:Full - confirmed with patient Family Communication: None present Disposition Plan:  Home once clinically improved Consults called: None  Admission status: Observation status, telemetry   Consultants:  none  Procedures:  none  Antibiotics:  n/a (indicate start date, and stop date if known)  HPI/Subjective: No events per nursing. Denies CP. No SOB. Pt states he will work on taking meds in furutre.   Objective: Vitals:   11/08/15 2342 11/09/15 0648  BP: (!) 129/94 132/89  Pulse: (!) 121 (!) 111  Resp: 18 18  Temp: 97.8 F (36.6 C) 97.9 F (36.6 C)    Intake/Output Summary (Last 24 hours) at 11/09/15 0921 Last data filed at 11/09/15 0837  Gross per 24 hour  Intake              623 ml  Output              820 ml  Net             -197 ml   Filed Weights   11/08/15 2042 11/08/15 2342 11/09/15 0648  Weight: 107 kg (235 lb 12.8 oz) 106.9 kg (235 lb 11.2 oz) 106.2 kg (234 lb 3.2 oz)    Exam:  General:  No diaphoresis, anxious, no acute distress Cardiovascular: Regular rate and rhythm no murmurs rubs or gallops, LE edema 2+ non pitting Respiratory: Clear to auscultation bilaterally no more breathing Abdomen: Nondistended bowel sounds normal nontender palpation  Musculoskeletal: Moving all extremities, no deformity, 5 out of 5 strength    Data Reviewed: Basic Metabolic Panel:  Recent Labs Lab 11/08/15 1450 11/09/15 0248  NA 132* 132*  K 3.8 3.6  CL 94* 93*  CO2 29 29  GLUCOSE 375* 355*  BUN 23* 24*  CREATININE 1.60* 1.55*  CALCIUM 8.3* 8.2*   Liver Function Tests: No results for input(s): AST, ALT, ALKPHOS,  BILITOT, PROT, ALBUMIN in the last 168 hours. No results for input(s): LIPASE, AMYLASE in the last 168 hours. No results for input(s): AMMONIA in the last 168 hours. CBC:  Recent Labs Lab 11/08/15 1450  WBC 7.3  HGB 13.4  HCT 43.9  MCV 82.1  PLT 150   Cardiac Enzymes: No results for input(s): CKTOTAL, CKMB, CKMBINDEX, TROPONINI in the last 168 hours. BNP (last 3 results)  Recent Labs  05/17/15 1559 08/15/15 1447 11/08/15 1450  BNP 777.8* 546.6* 748.2*    ProBNP (last 3 results)  Recent Labs  12/12/14 1614  PROBNP 1,892.00*    CBG:  Recent Labs Lab 11/08/15 2358 11/09/15 0540  GLUCAP 301* 356*    No results found for this or any previous visit (from the past 240 hour(s)).   Studies: Dg Chest 2 View  Result Date: 11/08/2015 CLINICAL DATA:  Shortness of breath abdominal fluid retention. EXAM: CHEST  2 VIEW COMPARISON:  Two-view chest x-ray 09/13/2015. FINDINGS: Cardiomegaly is stable. Mild edema is present. Right greater left pleural effusions and airspace disease are present. Pacing/defibrillator wire is stable. IMPRESSION: 1. Congestive heart failure. 2. Right greater than left basilar airspace disease. While this likely reflects atelectasis, infection or aspiration is also considered. Electronically Signed   By: Marin Robertshristopher  Mattern M.D.   On: 11/08/2015 16:13   [place] Abdomen Complete  Result Date: 11/09/2015 CLINICAL DATA:  Previous history of mild ascites EXAM: ABDOMEN ULTRASOUND COMPLETE COMPARISON:  05/14/2013 FINDINGS: Gallbladder: Gallbladder wall thickening is again noted without evidence of cholelithiasis. No significant pericholecystic fluid is noted. The wall thickening has improved slightly from the prior exam. Gallbladder sludge is noted. Common bile duct: Diameter: 3.3 mm. Liver: Mildly increased in echogenicity which may be related to fatty infiltration. No significant nodularity is noted. No mass lesion is seen. IVC: No abnormality visualized. Pancreas:  Visualized portion unremarkable. Spleen: Size and appearance within normal limits. Right Kidney: Length: 10.6 cm. 3.2 cm cyst is noted in the upper pole stable from the prior exam. Left Kidney: Length: 9.4 cm. Some limited views likely decrease the size of the left kidney. Echogenicity within normal limits. No mass or hydronephrosis visualized. Abdominal aorta: No aneurysm visualized. Other findings: Small bilateral pleural effusions are noted. No significant ascites is seen. IMPRESSION: Persistent but mildly improved gallbladder wall thickening. Stable right renal cyst. Small bilateral pleural effusions are noted. No significant ascites is seen. Electronically Signed   By: Alcide CleverMark  Lukens M.D.   On: 11/09/2015 07:35    Scheduled Meds: . amiodarone  200 mg Oral BID  . aspirin EC  81 mg Oral Daily  . atorvastatin  40 mg Oral q1800  . carvedilol  3.125 mg Oral BID WC  . digoxin  0.0625 mg Oral Daily  . ferrous sulfate  325 mg Oral Q breakfast  . furosemide  40 mg Intravenous Q6H  . insulin aspart  0-15 Units Subcutaneous TID WC  . isosorbide-hydrALAZINE  1 tablet Oral BID  . mometasone-formoterol  2 puff Inhalation BID  . nicotine  14 mg Transdermal Daily  . pantoprazole  40 mg Oral Daily  . rivaroxaban  20 mg Oral QAC supper  . sodium chloride flush  3 mL Intravenous Q12H  . spironolactone  25 mg Oral Daily   Continuous Infusions:   Principal Problem:   Acute on chronic combined systolic and diastolic CHF (congestive heart failure) (HCC) Active Problems:   Essential hypertension   Noncompliance   AF (atrial fibrillation) (HCC)   Tobacco abuse   DM (diabetes mellitus), type 2 with peripheral vascular complications (HCC)   Acute respiratory failure (HCC)   Coronary artery disease involving native coronary artery of native heart without angina pectoris   Ascites   AKI (acute kidney injury) (HCC)    Time spent: 25    Haydee Salter  [facility] Pager Amion If 7PM-7AM,  please contact night-coverage at [email or web address], password [facility] 11/09/2015, 9:21 AM  LOS: 0 days

## 2015-11-09 NOTE — Progress Notes (Signed)
Patient has Medicare /Medicad medical insurance with prescription drug coverage- CM unable to assist with any medication. B Preslie Depasquale RN,MHA,BSN [phone]     Coverage Information   Coverage information:    Subscriber: [id number] [name],Jacob    Rel to sub: 01 - Self    Member ID: [id number]    Payor: 246-MEDICAID [place]    Benefit plan: [id number]-MEDICAID [place] ACCESS Ph: [phone]    Group number: [id number]    Member effective dates: from 03/26/15     

## 2015-11-09 NOTE — Progress Notes (Signed)
Advanced Heart Failure Team Consult Note  Referring Physician: Dr. Melynda Ripple Primary Physician: [name], MD Primary Cardiologist:  Dr. Gala Romney   Reason for Consultation: A/C systolic HF  HPI:    Jacob [name] is a 60 year old [place] male with history of systolic HF EF 20-25% via Echo 08/03/14 s/p INCEPTA ICD implant 6/14, RV dysfunction, CAD s/p CABG x 2 w/ RF MAZE 7/14 at [facility], DM type II, Chronic afib on xarelto, GI bleed 11/28/2014, and HLD.   Admitted late December 2016 with increased dyspnea. Diuresed with IV lasix and transitioned to po lasix 80 mg twice a day. Chronic A fib and continued on amio 200 mg twice a day. Discharge weight was 205 pounds.   Last seen in Hf clinic 08/31/15, has since been non-compliant with follow up. Thought to be stable, though tenuous with NYHA IIIB symptoms.  Weight 201 lbs at that visit.   He was seen in ED 09/13/15 with CHF exacerbation but refused admission.  Presented to [facility]08/15 with abdominal distention, peripheral edema, and worsening SOB in the setting of medical non-compliance. Admitted for further evaluation and treatment.  Pertinent admission labs include K 3.8, Creatinine 1.6, BNP 748.2, Troponin negative. CXR with CHF and R>L basilar airspace disease. R>L pleural effusions  Feels "OK" this morning at rest. Still has significant DOE with walking across room, changing clothes, bathing.  States he has not taken his medications at home for > 2 weeks. When asked why, he states "laziness".  Pt has Medicare + Medicaid, so medication cost per medicine is ~ $1.20.  Denies fluid or diet non-compliance.  Weight up nearly 30 lbs. Doesn't weigh at home.   Review of Systems: [y] = yes, [ ]  = no   General: Weight gain [y]; Weight loss [ ] ; Anorexia [ ] ; Fatigue [ ] ; Fever [ ] ; Chills [ ] ; Weakness [ ]   Cardiac: Chest pain/pressure [ ] ; Resting SOB [ ] ; Exertional SOB [y]; Orthopnea [y]; Pedal Edema [y]; Palpitations [ ] ; Syncope [ ] ; Presyncope [  ]; Paroxysmal nocturnal dyspnea[ ]   Pulmonary: Cough [y]; Wheezing[ ] ; Hemoptysis[ ] ; Sputum [ ] ; Snoring [ ]   GI: Vomiting[ ] ; Dysphagia[ ] ; Melena[ ] ; Hematochezia [ ] ; Heartburn[ ] ; Abdominal pain [ ] ; Constipation [ ] ; Diarrhea [ ] ; BRBPR [ ]   GU: Hematuria[ ] ; Dysuria [ ] ; Nocturia[ ]   Vascular: Pain in legs with walking [ ] ; Pain in feet with lying flat [ ] ; Non-healing sores [ ] ; Stroke [ ] ; TIA [ ] ; Slurred speech [ ] ;  Neuro: Headaches[ ] ; Vertigo[ ] ; Seizures[ ] ; Paresthesias[ ] ;Blurred vision [ ] ; Diplopia [ ] ; Vision changes [ ]   Ortho/Skin: Arthritis [y]; Joint pain [y]; Muscle pain [ ] ; Joint swelling [ ] ; Back Pain [ ] ; Rash [ ]   Psych: Depression[ ] ; Anxiety[ ]   Heme: Bleeding problems [ ] ; Clotting disorders [ ] ; Anemia [ ]   Endocrine: Diabetes [ ] ; Thyroid dysfunction[ ]   Home Medications Prior to Admission medications   Medication Sig Start Date End Date Taking? Authorizing Provider  albuterol (PROVENTIL HFA;VENTOLIN HFA) 108 (90 Base) MCG/ACT inhaler Inhale 2 puffs into the lungs every 6 (six) hours as needed for wheezing or shortness of breath. 03/29/15   Jaclyn Shaggy, MD  albuterol (PROVENTIL) (2.5 MG/3ML) 0.083% nebulizer solution Take 3 mLs (2.5 mg total) by nebulization every 6 (six) hours as needed for wheezing or shortness of breath. 04/18/15   Jaclyn Shaggy, MD  amiodarone (PACERONE) 200 MG tablet Take 1  tablet (200 mg total) by mouth 2 (two) times daily. 09/18/15   Amy D Filbert Schilder, NP  atorvastatin (LIPITOR) 40 MG tablet Take 1 tablet (40 mg total) by mouth daily at 6 PM. 09/18/15   Amy D Clegg, NP  Blood Glucose Monitoring Suppl (ACCU-CHEK AVIVA) device Use as instructed 3 times daily before meals 04/18/15   Jaclyn Shaggy, MD  carvedilol (COREG) 3.125 MG tablet Take 1 tablet (3.125 mg total) by mouth 2 (two) times daily with a meal. 09/18/15   Amy D Clegg, NP  digoxin (LANOXIN) 0.125 MG tablet Take 0.5 tablets (0.0625 mg total) by mouth daily. 09/18/15   Amy D Clegg, NP  ferrous  sulfate 325 (65 FE) MG EC tablet Take 325 mg by mouth daily with breakfast.     Historical Provider, MD  Fluticasone-Salmeterol (ADVAIR) 100-50 MCG/DOSE AEPB Inhale 1 puff into the lungs daily.    Historical Provider, MD  glipiZIDE (GLUCOTROL) 10 MG tablet Take 1 tablet (10 mg total) by mouth 2 (two) times daily before a meal. 04/04/15   Jaclyn Shaggy, MD  isosorbide-hydrALAZINE (BIDIL) 20-37.5 MG tablet Take 1 tablet by mouth 2 (two) times daily. 09/18/15   Amy D Clegg, NP  pantoprazole (PROTONIX) 40 MG tablet Take 40 mg by mouth daily as needed.    Historical Provider, MD  rivaroxaban (XARELTO) 20 MG TABS tablet Take 1 tablet (20 mg total) by mouth daily before supper. 09/18/15   Amy D Filbert Schilder, NP  spironolactone (ALDACTONE) 25 MG tablet Take 1 tablet (25 mg total) by mouth daily. 09/18/15   Amy D Clegg, NP  torsemide (DEMADEX) 20 MG tablet Take 4 tablets (80 mg total) by mouth 2 (two) times daily. 09/18/15   Amy D Filbert Schilder, NP  traMADol (ULTRAM) 50 MG tablet Take 1 tablet (50 mg total) by mouth every 8 (eight) hours as needed for moderate pain. 01/30/15   Jaclyn Shaggy, MD    Past Medical History: Past Medical History:  Diagnosis Date  . Atrial fibrillation (HCC)    RVR 10/2014  . Automatic implantable cardioverter-defibrillator in situ   . CAD (coronary artery disease) Sept 2013   s/p cardiac cath showing occlusion of small RCA with collaterals  . CHF (congestive heart failure) (HCC)    20 to 25 % EF and RV dysfunction by 07/2014 echo   . Chronic anticoagulation    on xarelto.   . High cholesterol   . Hypertension   . Myocardial infarction (HCC) 2014  . Noncompliance    homelessness contributing.   . Peripheral arterial disease (HCC)   . Type II diabetes mellitus (HCC)     Past Surgical History: Past Surgical History:  Procedure Laterality Date  . CARDIAC CATHETERIZATION  09/2012  . CORONARY ANGIOPLASTY WITH STENT PLACEMENT  11/2011   "1"  . CORONARY ARTERY BYPASS GRAFT  09/2012   2 vessels  per patient [name]   . ESOPHAGOGASTRODUODENOSCOPY N/A 11/28/2014   Procedure: ESOPHAGOGASTRODUODENOSCOPY (EGD);  Surgeon: Beverley Fiedler, MD;  Location: [facility] ENDOSCOPY;  Service: Endoscopy;  Laterality: N/A;  . ILIAC ARTERY STENT Right 08/30/2013  . IMPLANTABLE CARDIOVERTER DEFIBRILLATOR IMPLANT     [place] in 07/2012; [employer]  . LEFT AND RIGHT HEART CATHETERIZATION WITH CORONARY ANGIOGRAM N/A 12/02/2011   Procedure: LEFT AND RIGHT HEART CATHETERIZATION WITH CORONARY ANGIOGRAM;  Surgeon: Kathleene Hazel, MD;  Location: [facility] CATH LAB;  Service: Cardiovascular;  Laterality: N/A;  . LOWER EXTREMITY ANGIOGRAM N/A 08/30/2013   Procedure: LOWER EXTREMITY ANGIOGRAM;  Surgeon:  Runell GessJonathan J Berry, MD;  [facility];  Service: Cardiovascular;  Laterality: N/A;  . LOWER EXTREMITY ANGIOGRAM N/A 12/02/2013   Procedure: LOWER EXTREMITY ANGIOGRAM;  Surgeon: Runell GessJonathan J Berry, MD;  [facility];  Service: Cardiovascular;  Laterality: N/A;    Family History: Family History  Problem Relation Age of Onset  . Diabetes Mother     Social History: Social History   Social History  . Marital status: Divorced    Spouse name: N/A  . Number of children: 3  . Years of education: N/A   Occupational History  . disabled    Social History Main Topics  . Smoking status: Current Every Day Smoker    Packs/day: 1.00    Years: 40.00    Types: Cigarettes  . Smokeless tobacco: Never Used  . Alcohol use No  . Drug use: No  . Sexual activity: No   Other Topics Concern  . None   Social History Narrative   Has an apartment with a roommate. He was living on the streets in October 2014.  He reports that his father died in [place] in October 2014.  He is divorced.  He is no longer estranged from his son, but still from his daughter who lives locally.  Neither of his parents, nor any siblings have any history of CAD.    Allergies:  Allergies  Allergen Reactions  . Tape Itching    Paper tape  please.    Objective:    Vital Signs:   Temp:  [97.4 F (36.3 C)-98.6 F (37 C)] 97.9 F (36.6 C) (08/17 0648) Pulse Rate:  [109-121] 111 (08/17 0648) Resp:  [18-92] 18 (08/17 0648) BP: (117-141)/(73-98) 132/89 (08/17 0648) SpO2:  [90 %-99 %] 96 % (08/17 0718) Weight:  [234 lb 3.2 oz (106.2 kg)-235 lb 12.8 oz (107 kg)] 234 lb 3.2 oz (106.2 kg) (08/17 0648) Last BM Date: 11/07/15  Weight change: Filed Weights   11/08/15 2042 11/08/15 2342 11/09/15 0648  Weight: 235 lb 12.8 oz (107 kg) 235 lb 11.2 oz (106.9 kg) 234 lb 3.2 oz (106.2 kg)    Intake/Output:   Intake/Output Summary (Last 24 hours) at 11/09/15 1009 Last data filed at 11/09/15 1002  Gross per 24 hour  Intake              743 ml  Output             1170 ml  Net             -427 ml     Physical Exam: General:  Well appearing. No resp difficulty HEENT: normal Neck: supple. JVP to ear. Carotids 2+ bilat; no bruits. No thyromegaly or nodule noted. Cor: PMI nondisplaced. Irregular rate & rhythm, tachy. No rubs, gallops or murmurs. Lungs: Mild basilar crackles Abdomen: soft, nontender, mild distention. No hepatosplenomegaly. No bruits or masses. Good bowel sounds. Extremities: no cyanosis, clubbing, rash. 1-2+ edema into thighs.  Neuro: alert & orientedx3, cranial nerves grossly intact. moves all 4 extremities w/o difficulty. Affect pleasant  Telemetry: Reviewed personally, A fib 110-120s  Labs: Basic Metabolic Panel:  Recent Labs Lab 11/08/15 1450 11/09/15 0248  NA 132* 132*  K 3.8 3.6  CL 94* 93*  CO2 29 29  GLUCOSE 375* 355*  BUN 23* 24*  CREATININE 1.60* 1.55*  CALCIUM 8.3* 8.2*    Liver Function Tests: No results for input(s): AST, ALT, ALKPHOS, BILITOT, PROT, ALBUMIN in the last 168 hours. No results for input(s):  LIPASE, AMYLASE in the last 168 hours. No results for input(s): AMMONIA in the last 168 hours.  CBC:  Recent Labs Lab 11/08/15 1450  WBC 7.3  HGB 13.4  HCT 43.9  MCV 82.1    PLT 150    Cardiac Enzymes: No results for input(s): CKTOTAL, CKMB, CKMBINDEX, TROPONINI in the last 168 hours.  BNP: BNP (last 3 results)  Recent Labs  05/17/15 1559 08/15/15 1447 11/08/15 1450  BNP 777.8* 546.6* 748.2*    ProBNP (last 3 results)  Recent Labs  12/12/14 1614  PROBNP 1,892.00*     CBG:  Recent Labs Lab 11/08/15 2358 11/09/15 0540  GLUCAP 301* 356*    Coagulation Studies: No results for input(s): LABPROT, INR in the last 72 hours.  Other results: EKG: Afib   Imaging: Dg Chest 2 View  Result Date: 11/08/2015 CLINICAL DATA:  Shortness of breath abdominal fluid retention. EXAM: CHEST  2 VIEW COMPARISON:  Two-view chest x-ray 09/13/2015. FINDINGS: Cardiomegaly is stable. Mild edema is present. Right greater left pleural effusions and airspace disease are present. Pacing/defibrillator wire is stable. IMPRESSION: 1. Congestive heart failure. 2. Right greater than left basilar airspace disease. While this likely reflects atelectasis, infection or aspiration is also considered. Electronically Signed   By: Marin Roberts M.D.   On: 11/08/2015 16:13   US Abdomen Complete  Result Date: 11/09/2015 CLINICAL DATA:  Previous history of mild ascites EXAM: ABDOMEN ULTRASOUND COMPLETE COMPARISON:  05/14/2013 FINDINGS: Gallbladder: Gallbladder wall thickening is again noted without evidence of cholelithiasis. No significant pericholecystic fluid is noted. The wall thickening has improved slightly from the prior exam. Gallbladder sludge is noted. Common bile duct: Diameter: 3.3 mm. Liver: Mildly increased in echogenicity which may be related to fatty infiltration. No significant nodularity is noted. No mass lesion is seen. IVC: No abnormality visualized. Pancreas: Visualized portion unremarkable. Spleen: Size and appearance within normal limits. Right Kidney: Length: 10.6 cm. 3.2 cm cyst is noted in the upper pole stable from the prior exam. Left Kidney: Length: 9.4  cm. Some limited views likely decrease the size of the left kidney. Echogenicity within normal limits. No mass or hydronephrosis visualized. Abdominal aorta: No aneurysm visualized. Other findings: Small bilateral pleural effusions are noted. No significant ascites is seen. IMPRESSION: Persistent but mildly improved gallbladder wall thickening. Stable right renal cyst. Small bilateral pleural effusions are noted. No significant ascites is seen. Electronically Signed   By: Alcide Clever M.D.   On: 11/09/2015 07:35      Medications:     Current Medications: . amiodarone  200 mg Oral BID  . aspirin EC  81 mg Oral Daily  . atorvastatin  40 mg Oral q1800  . carvedilol  3.125 mg Oral BID WC  . digoxin  0.0625 mg Oral Daily  . ferrous sulfate  325 mg Oral Q breakfast  . furosemide  40 mg Intravenous Q6H  . insulin aspart  0-15 Units Subcutaneous TID WC  . isosorbide-hydrALAZINE  1 tablet Oral BID  . mometasone-formoterol  2 puff Inhalation BID  . nicotine  14 mg Transdermal Daily  . pantoprazole  40 mg Oral Daily  . rivaroxaban  20 mg Oral QAC supper  . sodium chloride flush  3 mL Intravenous Q12H  . spironolactone  25 mg Oral Daily     Infusions:      Assessment/Plan   Jacob [name] is a 60 y.o. male with systolic CHF, Chronic Afib, DMII, H/o GI pleed, PAD, and history of non-compliance who  presents with A/C systolic CHF in setting of medical non-compliance x 2 weeks.   1. Acute on chronic Systolic Heart Failure - ICM EF 20-25%. Has [employer] ICD.  NYHA IIIb symptoms in setting of medical non-compliance for at least 2 weeks.   - Increase lasix to 80 mg BID.   He has needed milrinone in the past. If has poor diuresis, can augment with milrinone.  - Pt is NOT a candidate for advanced therapies with social situation and non compliance. - Continue spiro 25 mg  - Continue dig 0.0625 mg.  - No BB with h/o cardiogenic shock and acute decompensation. - Continue bidil 1 tab tid.  2.  Chronic A fib  - Continue Amio 200 mg BID and Xarelto. No bleeding problems.  - Will check CMET and TSH while in house.  - Needs yearly eye exams.  3. DMII- Per PCP  4. H/O GI Bleed - duodenal ulcer with clip 9/5 - Watch closely while in hospital with resumption of Xarelto. 5. Social Issues - Currently resides in low income housing.  - Medications should be $1.20 a piece. HF CSW and Nurse Navigator following. 6. Smoking- Current smoker. Counseled on smoking cessation.  7. CAD: CABG in 2014 at [place], no chest pain.  Continue atorvastatin.  He is on Xarelto so does not have to take aspirin.  8. CKD: Stage III. Watch carefully with diuresis.   Increase diuretics as above. Will need to be diuresed over the weekend at least, with 20-30 lbs up.  Monitor electrolytes.  Length of Stay: 0  Mariam Dollar [facility] PA-C 11/09/2015, 10:09 AM  Advanced Heart Failure Team Pager [phone] (M-F; 7a - 4p)  Please contact [facility] for night-coverage after hours (4p -7a ) and weekends on [email or web address]  Patient seen with PA, agree with the above note.  He stopped all his meds at home, has been off x 2 weeks.  Did not go to get refills.  He is markedly volume overloaded.  He remains in atrial fibrillation with mild RVR.  - Restart his home meds.  - Atrial fibrillation is chronic, he has been on amiodarone for rate control and Xarelto.  - Creatinine is at baseline, watch carefully with diuresis.  - Start Lasix 80 mg IV bid for now and follow response.  Has required milrinone in the past for low output but is not a candidate for advanced therapies or home milrinone due to social situation.  Could use in hospital if needed.  - Continue discussions about compliance, already followed by paramedicine.   Marca Ancona 11/09/2015 12:02 PM

## 2015-11-09 NOTE — Progress Notes (Signed)
Heart Failure Navigator Consult Note  Presentation: Bethann PunchesYahia Pautz is a 60 y.o. male with medical history significant of CAD, afib, HFrEF presenting with apparent CHF exacerbation.  Patient was last seen in the ER on 09/13/15 with similar symptoms and refused admission at that time.  He presents today with c/o diffuse edema - body, face, feet, lungs.  Unable to eat due to lack of appetite.  Unable to lie flat, sleeps on 1-2 pillows on belly.  Occasional PND.  +SOB.  Significant DOE.  Feels too weak to walk around.  Has had symptoms since he had open heart surgery but worse over last 3-4 weeks.  Ran out of medications 2-3 weeks ago, unable to afford medications despite disability (only gets $600/month).     ED Course:  Per Dr. Moody BruinsIrick: Patient with history of systolic heart failure, ejection fraction 30-35% since her evaluation of one week of worsening shortness of breath, orthopnea, dyspnea on exertion, leg swelling. He has gained roughly 15 pounds over the last 1-2 weeks. He reports compliance with his Lasix at home however not taking any of his other medications.  Past Medical History:  Diagnosis Date  . Atrial fibrillation (HCC)    RVR 10/2014  . Automatic implantable cardioverter-defibrillator in situ   . CAD (coronary artery disease) Sept 2013   s/p cardiac cath showing occlusion of small RCA with collaterals  . CHF (congestive heart failure) (HCC)    20 to 25 % EF and RV dysfunction by 07/2014 echo   . Chronic anticoagulation    on xarelto.   . High cholesterol   . Hypertension   . Myocardial infarction (HCC) 2014  . Noncompliance    homelessness contributing.   . Peripheral arterial disease (HCC)   . Type II diabetes mellitus (HCC)     Social History   Social History  . Marital status: Divorced    Spouse name: N/A  . Number of children: 3  . Years of education: N/A   Occupational History  . disabled    Social History Main Topics  . Smoking status: Current Every Day Smoker   Packs/day: 1.00    Years: 40.00    Types: Cigarettes  . Smokeless tobacco: Never Used  . Alcohol use No  . Drug use: No  . Sexual activity: No   Other Topics Concern  . None   Social History Narrative   Has an apartment with a roommate. He was living on the streets in October 2014.  He reports that his father died in [place] in October 2014.  He is divorced.  He is no longer estranged from his son, but still from his daughter who lives locally.  Neither of his parents, nor any siblings have any history of CAD.    ECHO:Study Conclusions--11/24/14  - Left ventricle: The cavity size was normal. There was mild focal   basal hypertrophy of the septum. Systolic function was moderately   to severely reduced. The estimated ejection fraction was in the   range of 30% to 35%. Diffuse hypokinesis. - Mitral valve: Calcified annulus. There was mild regurgitation. - Left atrium: The atrium was moderately dilated. - Right ventricle: The cavity size was mildly dilated. Wall   thickness was normal.  Impressions:  - EF is improved when compared to prior study.  BNP    Component Value Date/Time   BNP 748.2 (H) 11/08/2015 1450   BNP 996.8 (H) 02/27/2015 1521    ProBNP    Component Value Date/Time  PROBNP 1,892.00 (H) 12/12/2014 1614     Education Assessment and Provision:  Detailed education and instructions provided on heart failure disease management including the following:  Signs and symptoms of Heart Failure When to call the physician Importance of daily weights Low sodium diet Fluid restriction Medication management Anticipated future follow-up appointments  Patient education given on each of the above topics.  Patient acknowledges understanding and acceptance of all instructions.  I spoke with Mr Yockel regarding his current hospitalization.  He is well known to me and the AHF Team as he is followed in the clinic and HF [employer].  He tells me that he  has "no excuse" for not getting his medications and taking them.  His prescriptions (because he has Medicare and Medicaid) are approx $1.20 for each med.  I expressed the importance of taking prescribed medications.  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  He will continue to follow in the AHF Clinic after discharge.  Education Materials:  "Living Better With Heart Failure" Booklet, Daily Weight Tracker Tool   High Risk Criteria for Readmission and/or Poor Patient Outcomes:  (Recommend Follow-up with Advanced Heart Failure Clinic)-- yes he will need to continue to follow with AHF Clinic   EF <30%-30-35%  2 or more admissions in 6 months-No  Social Barriers--limited income  Demonstrates medication noncompliance- Yes   Barriers of Care:  Health literacy, knowledge and compliance   Discharge Planning:   Plans to return to [facility] in [place]  Notes to Patient's Outpatient Care Team for Continued Management in the Community:   Patient has Medicaid and Medicare.  He gets medications at [facility] for approx $1.20 each.  He has been unhappy in the past with long wait times to pick up meds.

## 2015-11-09 NOTE — Care Management Obs Status (Signed)
MEDICARE OBSERVATION STATUS NOTIFICATION   Patient Details  Name: Jacob [name] MRN: [id number] Date of Birth: 1956/01/12   Medicare Observation Status Notification Given:  Yes    Cherrie Distance, RN 11/09/2015, 3:13 PM

## 2015-11-09 NOTE — Progress Notes (Addendum)
Diet/nutrition education attempted with patient. Patient does not appear receptive to education at this time. Patient asking staff for coffee and graham crackers multiple times and cursing at nursing staff regarding food. Patient states he did not eat any of his breakfast, but breakfast tray is empty. Will continue to monitor.

## 2015-11-09 NOTE — Progress Notes (Signed)
Patient very well known to the AHF Service and is followed through HF [employer].  He is frequently unavailable for home visits as he has a new job.  He has been provided resources to get medications either at no cost or very little cost and often does not take advantage of these resources.  He has poor health literacy and lacks insight into his illness.  I will inform our team of his admission.

## 2015-11-09 NOTE — Hospital Discharge Follow-Up (Signed)
Transitional Care Clinic Care Coordination Note:  Admit date:  11/08/15 Discharge date: TBD Discharge Disposition: Home when stable Patient contact: [phone] Emergency contact(s): none  Patient well known to the [facility]. This Case Manager met with patient at bedside to determine plan for primary care post-discharge follow-up. Patient agreeable to follow-up with the Transitional Care Clinic after discharge.   Patient scheduled for Transitional Care appointment on 11/21/15 at 1415 with Dr. Jarold Song.  Clinic information and appointment time provided to patient. Appointment information also placed on AVS.  Assessment:       Home Environment: Patient lives alone in a one bedroom apartment.       Support System: friend       Level of functioning: Independent though he does indicate he has a friend who cleans his apartment once/week       Home DME: none. Patient does indicate he has a scale at home and weighs himself daily. He indicates he does not have a glucometer to check blood glucose. This Case Manager informed Olga Coaster, RN CM of patient's need for glucometer and diabetes monitoring supplies upon discharge. Informed her that [facility] is covered by Medicaid.       Home care services: Patient is part of CHF Paramedicine program and receives care management services with [facility].       Transportation: Patient has a vehicle and typically drives to his medical appointments.        Food/Nutrition: Patient receives Physicist, medical and indicates he has access to needed food.        Medications: Patient gets his medications from the pharmacy at [facility], but he indicates he has been out of medications for 2-3 weeks because he cannot afford medications. Patient is insured.  Reminded patient of the pharmacy resources available at [facility] and stressed  importance of patient picking up his medications as soon as possible when discharged. Stressed importance of medication compliance, and patient verbalized understanding.        Identified Barriers: medication noncompliance, limited income        PCP: Dr. Zenaida Deed [facility] and [facility] services:        Services communicated with Olga Coaster, RN CM

## 2015-11-10 ENCOUNTER — Inpatient Hospital Stay (HOSPITAL_COMMUNITY): Payer: Medicare Other

## 2015-11-10 DIAGNOSIS — I480 Paroxysmal atrial fibrillation: Secondary | ICD-10-CM

## 2015-11-10 DIAGNOSIS — N179 Acute kidney failure, unspecified: Secondary | ICD-10-CM

## 2015-11-10 DIAGNOSIS — I5043 Acute on chronic combined systolic (congestive) and diastolic (congestive) heart failure: Secondary | ICD-10-CM

## 2015-11-10 DIAGNOSIS — I509 Heart failure, unspecified: Secondary | ICD-10-CM

## 2015-11-10 LAB — GLUCOSE, CAPILLARY
GLUCOSE-CAPILLARY: 218 mg/dL — AB (ref 65–99)
GLUCOSE-CAPILLARY: 239 mg/dL — AB (ref 65–99)
Glucose-Capillary: 241 mg/dL — ABNORMAL HIGH (ref 65–99)
Glucose-Capillary: 298 mg/dL — ABNORMAL HIGH (ref 65–99)

## 2015-11-10 LAB — BASIC METABOLIC PANEL
Anion gap: 7 (ref 5–15)
BUN: 27 mg/dL — AB (ref 6–20)
CHLORIDE: 96 mmol/L — AB (ref 101–111)
CO2: 32 mmol/L (ref 22–32)
CREATININE: 1.64 mg/dL — AB (ref 0.61–1.24)
Calcium: 8.4 mg/dL — ABNORMAL LOW (ref 8.9–10.3)
GFR calc Af Amer: 52 mL/min — ABNORMAL LOW (ref >60)
GFR calc non Af Amer: 45 mL/min — ABNORMAL LOW (ref >60)
GLUCOSE: 141 mg/dL — AB (ref 65–99)
Potassium: 4.1 mmol/L (ref 3.5–5.1)
SODIUM: 135 mmol/L (ref 135–145)

## 2015-11-10 LAB — TSH: TSH: 3.035 u[IU]/mL (ref 0.350–4.500)

## 2015-11-10 LAB — CBC WITH DIFFERENTIAL/PLATELET
Basophils Absolute: 0 10*3/uL (ref 0.0–0.1)
Basophils Relative: 1 %
EOS ABS: 0.1 10*3/uL (ref 0.0–0.7)
EOS PCT: 1 %
HCT: 44 % (ref 39.0–52.0)
Hemoglobin: 12.8 g/dL — ABNORMAL LOW (ref 13.0–17.0)
LYMPHS ABS: 1.3 10*3/uL (ref 0.7–4.0)
Lymphocytes Relative: 16 %
MCH: 24.2 pg — AB (ref 26.0–34.0)
MCHC: 29.1 g/dL — AB (ref 30.0–36.0)
MCV: 83.2 fL (ref 78.0–100.0)
MONOS PCT: 8 %
Monocytes Absolute: 0.6 10*3/uL (ref 0.1–1.0)
Neutro Abs: 6.3 10*3/uL (ref 1.7–7.7)
Neutrophils Relative %: 75 %
PLATELETS: 213 10*3/uL (ref 150–400)
RBC: 5.29 MIL/uL (ref 4.22–5.81)
RDW: 18.2 % — ABNORMAL HIGH (ref 11.5–15.5)
WBC: 8.3 10*3/uL (ref 4.0–10.5)

## 2015-11-10 LAB — HEPATIC FUNCTION PANEL
ALT: 15 U/L — AB (ref 17–63)
AST: 17 U/L (ref 15–41)
Albumin: 3 g/dL — ABNORMAL LOW (ref 3.5–5.0)
Alkaline Phosphatase: 149 U/L — ABNORMAL HIGH (ref 38–126)
BILIRUBIN DIRECT: 0.7 mg/dL — AB (ref 0.1–0.5)
BILIRUBIN INDIRECT: 0.9 mg/dL (ref 0.3–0.9)
BILIRUBIN TOTAL: 1.6 mg/dL — AB (ref 0.3–1.2)
Total Protein: 6.8 g/dL (ref 6.5–8.1)

## 2015-11-10 LAB — ECHOCARDIOGRAM COMPLETE
Height: 63 in
WEIGHTICAEL: 3750.4 [oz_av]

## 2015-11-10 LAB — DIGOXIN LEVEL: Digoxin Level: 0.2 ng/mL — ABNORMAL LOW (ref 0.8–2.0)

## 2015-11-10 LAB — HEMOGLOBIN A1C
HEMOGLOBIN A1C: 11 % — AB (ref 4.8–5.6)
MEAN PLASMA GLUCOSE: 269 mg/dL

## 2015-11-10 MED ORDER — MILRINONE LACTATE IN DEXTROSE 20-5 MG/100ML-% IV SOLN
0.1250 ug/kg/min | INTRAVENOUS | Status: AC
  Administered 2015-11-10 – 2015-11-13 (×7): 0.25 ug/kg/min via INTRAVENOUS
  Administered 2015-11-14: 0.125 ug/kg/min via INTRAVENOUS
  Filled 2015-11-10 (×8): qty 100

## 2015-11-10 MED ORDER — METOPROLOL TARTRATE 5 MG/5ML IV SOLN: 5.0000 mg | Freq: Three times a day (TID) | INTRAVENOUS | Status: DC | PRN

## 2015-11-10 MED ORDER — FUROSEMIDE 10 MG/ML IJ SOLN
80.0000 mg | Freq: Three times a day (TID) | INTRAMUSCULAR | Status: DC
  Administered 2015-11-10 – 2015-11-14 (×12): 80 mg via INTRAVENOUS
  Filled 2015-11-10 (×12): qty 8

## 2015-11-10 NOTE — Progress Notes (Signed)
  Echocardiogram 2D Echocardiogram has been performed.  Arvil Chaco 11/10/2015, 1:58 PM

## 2015-11-10 NOTE — Progress Notes (Signed)
[facility] PROGRESS NOTE  Jacob [name] ZOX:[id number] DOB: Jul 30, 1955 DOA: 11/08/2015 PCP: Jeanann LewandowskyJEGEDE, OLUGBEMIGA, MD  Today: Cont diuresis. ECHO. Rate control. Monitor AKI.  Assessment/Plan: HFrEF On admission patient with markedly reduced EF on 9/16 echo - EF 30-35%; this was actually improved compared to prior. He has the appropriate medications ordered - Amio, Lipitor, Coreg, Dig, Xarelto - other than ACE (given mild AKI, will not add currently) -Unfortunately, regardless of how important these medications are, he cannot afford to have them filled and so is non-compliant. Medication non-compliance is likely what led to this admission -Placed on observation status on telemetry floor and try to improve his exacerbation -Respiratory failure is 2* to CHF and is already improved from presentation following IV Lasix -Will continue IV Lasix and home medications -CHF order set implemented -CM and SW intervention appreciated  -He does have an AICD -On Demadex, Aldactone at home; may need to switch Torsemide to Furosemide for some cost savings -Repeat echo ordered-->EF 25-30%  Afib -mild RVR on presentation which likely will improve with resumption of home medications -Will resume Xarelto, but he either needs vouchering/med assistance or should transition back to the much cheaper Coumadin -prn lopressor for RVR  HTN -Continue Coreg, Bidil -Not on ACE, consider adding once AKI is resolved  AKI -Likely resulting from decreased renal blood flow resulting from CHF/volume overload -Anticipate improvement with diuresis, will follow - baseline around 1.14, new baseline?   DM -Suboptimal control as to be expected since he has not been taking medication -Hold Glucotrol -Cover with SSI  CAD -Continue Bidil and Lipitor -Does not appear to be taking ASA, addded  Abd distension -On admission patient with apparent ascites - complains of abdominal edema and appears to have a fluid wave  - although this could simply be pannus-associated -AP is elevated, T bili is slightly elevated -No ascities on abdominal ultrasound  Tobacco Tobacco Dependence: encourage cessation.  This was discussed with the patient and should be reviewed on an ongoing basis.   -Patch ordered.  DVT prophylaxis: Resumed Xarelto Code Status:Full - confirmed with patient Family Communication: None present Disposition Plan:  Home once clinically improved Consults called: None  Admission status: Observation status, telemetry   Consultants:  none  Procedures:  ECHO Study Conclusions  - Left ventricle: The cavity size was moderately dilated. Wall   thickness was normal. Systolic function was severely reduced. The   estimated ejection fraction was in the range of 25% to 30%.   Diffuse hypokinesis. - Ventricular septum: Septal motion showed paradox. These changes   are consistent with a left bundle branch block. - Aortic valve: There was trivial regurgitation. - Left atrium: The atrium was severely dilated. - Right ventricle: Systolic function was moderately reduced. - Right atrium: The atrium was mildly dilated. - Pulmonary arteries: Systolic pressure was mildly increased. PA   peak pressure: 38 mm Hg (S). - Pericardium, extracardiac: There was a right pleural effusion  Antibiotics:  n/a (indicate start date, and stop date if known)  HPI/Subjective: No events per nursing. Denies CP. No SOB.   Objective: Vitals:   11/10/15 1610 11/10/15 1750  BP: 114/64 137/72  Pulse: (!) 107 (!) 118  Resp: 18   Temp: 97.5 F (36.4 C)     Intake/Output Summary (Last 24 hours) at 11/10/15 1926 Last data filed at 11/10/15 1800  Gross per 24 hour  Intake              600 ml  Output  1350 ml  Net             -750 ml   Filed Weights   11/08/15 2342 11/09/15 0648 11/10/15 0542  Weight: 106.9 kg (235 lb 11.2 oz) 106.2 kg (234 lb 3.2 oz) 106.3 kg (234 lb 6.4 oz)    Exam:  General:   No diaphoresis, anxious, NAD Cardiovascular: Regular rate and rhythm no murmurs rubs or gallops, LE edema 2+ non pitting Respiratory: Clear to auscultation bilaterally no more breathing Abdomen: Nondistended bowel sounds normal nontender palpation Musculoskeletal: Moving all extremities, no deformity, 5 out of 5 strength    Data Reviewed: Basic Metabolic Panel:  Recent Labs Lab 11/08/15 1450 11/09/15 0248 11/10/15 0317  NA 132* 132* 135  K 3.8 3.6 4.1  CL 94* 93* 96*  CO2 29 29 32  GLUCOSE 375* 355* 141*  BUN 23* 24* 27*  CREATININE 1.60* 1.55* 1.64*  CALCIUM 8.3* 8.2* 8.4*   Liver Function Tests:  Recent Labs Lab 11/10/15 0317  AST 17  ALT 15*  ALKPHOS 149*  BILITOT 1.6*  PROT 6.8  ALBUMIN 3.0*   No results for input(s): LIPASE, AMYLASE in the last 168 hours. No results for input(s): AMMONIA in the last 168 hours. CBC:  Recent Labs Lab 11/08/15 1450 11/10/15 0317  WBC 7.3 8.3  NEUTROABS  --  6.3  HGB 13.4 12.8*  HCT 43.9 44.0  MCV 82.1 83.2  PLT 150 213   Cardiac Enzymes: No results for input(s): CKTOTAL, CKMB, CKMBINDEX, TROPONINI in the last 168 hours. BNP (last 3 results)  Recent Labs  05/17/15 1559 08/15/15 1447 11/08/15 1450  BNP 777.8* 546.6* 748.2*    ProBNP (last 3 results)  Recent Labs  12/12/14 1614  PROBNP 1,892.00*    CBG:  Recent Labs Lab 11/09/15 1718 11/09/15 2102 11/10/15 0642 11/10/15 1137 11/10/15 1609  GLUCAP 254* 273* 218* 239* 241*    No results found for this or any previous visit (from the past 240 hour(s)).   Studies: US Abdomen Complete  Result Date: 11/09/2015 CLINICAL DATA:  Previous history of mild ascites EXAM: ABDOMEN ULTRASOUND COMPLETE COMPARISON:  05/14/2013 FINDINGS: Gallbladder: Gallbladder wall thickening is again noted without evidence of cholelithiasis. No significant pericholecystic fluid is noted. The wall thickening has improved slightly from the prior exam. Gallbladder sludge is noted.  Common bile duct: Diameter: 3.3 mm. Liver: Mildly increased in echogenicity which may be related to fatty infiltration. No significant nodularity is noted. No mass lesion is seen. IVC: No abnormality visualized. Pancreas: Visualized portion unremarkable. Spleen: Size and appearance within normal limits. Right Kidney: Length: 10.6 cm. 3.2 cm cyst is noted in the upper pole stable from the prior exam. Left Kidney: Length: 9.4 cm. Some limited views likely decrease the size of the left kidney. Echogenicity within normal limits. No mass or hydronephrosis visualized. Abdominal aorta: No aneurysm visualized. Other findings: Small bilateral pleural effusions are noted. No significant ascites is seen. IMPRESSION: Persistent but mildly improved gallbladder wall thickening. Stable right renal cyst. Small bilateral pleural effusions are noted. No significant ascites is seen. Electronically Signed   By: Alcide Clever M.D.   On: 11/09/2015 07:35    Scheduled Meds: . amiodarone  200 mg Oral BID  . aspirin EC  81 mg Oral Daily  . atorvastatin  40 mg Oral q1800  . carvedilol  3.125 mg Oral BID WC  . digoxin  0.0625 mg Oral Daily  . ferrous sulfate  325 mg Oral Q breakfast  .  furosemide  80 mg Intravenous Q8H  . insulin aspart  0-15 Units Subcutaneous TID WC  . isosorbide-hydrALAZINE  1 tablet Oral BID  . mometasone-formoterol  2 puff Inhalation BID  . nicotine  14 mg Transdermal Daily  . pantoprazole  40 mg Oral Daily  . rivaroxaban  20 mg Oral QAC supper  . sodium chloride flush  3 mL Intravenous Q12H  . spironolactone  25 mg Oral Daily   Continuous Infusions: . milrinone 0.25 mcg/kg/min (11/10/15 0948)    Principal Problem:   Acute on chronic combined systolic and diastolic CHF (congestive heart failure) (HCC) Active Problems:   Essential hypertension   Noncompliance   AF (atrial fibrillation) (HCC)   Tobacco abuse   DM (diabetes mellitus), type 2 with peripheral vascular complications (HCC)   Acute  respiratory failure (HCC)   Coronary artery disease involving native coronary artery of native heart without angina pectoris   Ascites   AKI (acute kidney injury) (HCC)   CHF (congestive heart failure) (HCC)    Time spent: 25    Haydee Salter  [facility] Pager Amion If 7PM-7AM, please contact night-coverage at [email or web address], password [facility] 11/10/2015, 7:26 PM  LOS: 1 day

## 2015-11-10 NOTE — Progress Notes (Signed)
Inpatient Diabetes Program Recommendations  AACE/ADA: New Consensus Statement on Inpatient Glycemic Control (2015)  Target Ranges:  Prepandial:   less than 140 mg/dL      Peak postprandial:   less than 180 mg/dL (1-2 hours)      Critically ill patients:  140 - 180 mg/dL   Lab Results  Component Value Date   GLUCAP 239 (H) 11/10/2015   HGBA1C 11.0 (H) 11/09/2015    Review of Glycemic Control  Diabetes history: DM2 Outpatient Diabetes medications: glipizide 10 mg bid Current orders for Inpatient glycemic control: Novolog moderate tidwc  HgbA1C indicates sub-optimal glycemic control at home. Needs basal insulin at discharge. Noted pt has Medicare and is unable to afford medications. FBS and post-prandial blood sugars elevated. May benefit from Novolin 70/30 (Reli-On brand at [facility]) at home. In ECHO Lab at present.  Inpatient Diabetes Program Recommendations:    70/30 15 units bid (for hospital)  RN to review insulin administration and allow pt to give her own insulin while inpatient.  When discharging, please order Novolin 70/30 insulin. ($24.99 at [facility])  Thank you. Ailene Ards, RD, LDN, CDE Inpatient Diabetes Coordinator [phone]

## 2015-11-10 NOTE — Progress Notes (Addendum)
Advanced Heart Failure Team  Progress Note   Referring Physician: Dr. Melynda Ripple Primary Physician: [name], MD Primary Cardiologist:  Dr. Gala Romney   Reason for Consultation: A/C systolic HF  HPI:   Admitted with increased dyspnea and volume overload. Diuresing with IV lasix. Sluggish urine output. Weight not coming down.   SOB at rest but says its a little better.    Home Medications Prior to Admission medications   Medication Sig Start Date End Date Taking? Authorizing Provider  albuterol (PROVENTIL HFA;VENTOLIN HFA) 108 (90 Base) MCG/ACT inhaler Inhale 2 puffs into the lungs every 6 (six) hours as needed for wheezing or shortness of breath. 03/29/15   Jaclyn Shaggy, MD  albuterol (PROVENTIL) (2.5 MG/3ML) 0.083% nebulizer solution Take 3 mLs (2.5 mg total) by nebulization every 6 (six) hours as needed for wheezing or shortness of breath. 04/18/15   Jaclyn Shaggy, MD  amiodarone (PACERONE) 200 MG tablet Take 1 tablet (200 mg total) by mouth 2 (two) times daily. 09/18/15   Amy D Filbert Schilder, NP  atorvastatin (LIPITOR) 40 MG tablet Take 1 tablet (40 mg total) by mouth daily at 6 PM. 09/18/15   Amy D Clegg, NP  Blood Glucose Monitoring Suppl (ACCU-CHEK AVIVA) device Use as instructed 3 times daily before meals 04/18/15   Jaclyn Shaggy, MD  carvedilol (COREG) 3.125 MG tablet Take 1 tablet (3.125 mg total) by mouth 2 (two) times daily with a meal. 09/18/15   Amy D Clegg, NP  digoxin (LANOXIN) 0.125 MG tablet Take 0.5 tablets (0.0625 mg total) by mouth daily. 09/18/15   Amy D Clegg, NP  ferrous sulfate 325 (65 FE) MG EC tablet Take 325 mg by mouth daily with breakfast.     Historical Provider, MD  Fluticasone-Salmeterol (ADVAIR) 100-50 MCG/DOSE AEPB Inhale 1 puff into the lungs daily.    Historical Provider, MD  glipiZIDE (GLUCOTROL) 10 MG tablet Take 1 tablet (10 mg total) by mouth 2 (two) times daily before a meal. 04/04/15   Jaclyn Shaggy, MD  isosorbide-hydrALAZINE (BIDIL) 20-37.5 MG tablet Take 1  tablet by mouth 2 (two) times daily. 09/18/15   Amy D Clegg, NP  pantoprazole (PROTONIX) 40 MG tablet Take 40 mg by mouth daily as needed.    Historical Provider, MD  rivaroxaban (XARELTO) 20 MG TABS tablet Take 1 tablet (20 mg total) by mouth daily before supper. 09/18/15   Amy D Filbert Schilder, NP  spironolactone (ALDACTONE) 25 MG tablet Take 1 tablet (25 mg total) by mouth daily. 09/18/15   Amy D Clegg, NP  torsemide (DEMADEX) 20 MG tablet Take 4 tablets (80 mg total) by mouth 2 (two) times daily. 09/18/15   Amy D Filbert Schilder, NP  traMADol (ULTRAM) 50 MG tablet Take 1 tablet (50 mg total) by mouth every 8 (eight) hours as needed for moderate pain. 01/30/15   Jaclyn Shaggy, MD    Past Medical History: Past Medical History:  Diagnosis Date  . Atrial fibrillation (HCC)    RVR 10/2014  . Automatic implantable cardioverter-defibrillator in situ   . CAD (coronary artery disease) Sept 2013   s/p cardiac cath showing occlusion of small RCA with collaterals  . CHF (congestive heart failure) (HCC)    20 to 25 % EF and RV dysfunction by 07/2014 echo   . Chronic anticoagulation    on xarelto.   . High cholesterol   . Hypertension   . Myocardial infarction (HCC) 2014  . Noncompliance    homelessness contributing.   . Peripheral arterial  disease (HCC)   . Type II diabetes mellitus (HCC)     Past Surgical History: Past Surgical History:  Procedure Laterality Date  . CARDIAC CATHETERIZATION  09/2012  . CORONARY ANGIOPLASTY WITH STENT PLACEMENT  11/2011   "1"  . CORONARY ARTERY BYPASS GRAFT  09/2012   2 vessels per patient [name]   . ESOPHAGOGASTRODUODENOSCOPY N/A 11/28/2014   Procedure: ESOPHAGOGASTRODUODENOSCOPY (EGD);  Surgeon: Beverley Fiedler, MD;  Location: [facility] ENDOSCOPY;  Service: Endoscopy;  Laterality: N/A;  . ILIAC ARTERY STENT Right 08/30/2013  . IMPLANTABLE CARDIOVERTER DEFIBRILLATOR IMPLANT     [place] in 07/2012; [employer]  . LEFT AND RIGHT HEART CATHETERIZATION WITH CORONARY ANGIOGRAM N/A 12/02/2011     Procedure: LEFT AND RIGHT HEART CATHETERIZATION WITH CORONARY ANGIOGRAM;  Surgeon: Kathleene Hazel, MD;  Location: [facility] CATH LAB;  Service: Cardiovascular;  Laterality: N/A;  . LOWER EXTREMITY ANGIOGRAM N/A 08/30/2013   Procedure: LOWER EXTREMITY ANGIOGRAM;  Surgeon: Runell Gess, MD;  Location: [facility] CATH LAB;  Service: Cardiovascular;  Laterality: N/A;  . LOWER EXTREMITY ANGIOGRAM N/A 12/02/2013   Procedure: LOWER EXTREMITY ANGIOGRAM;  Surgeon: Runell Gess, MD;  Location[facility] C[facility] LAB;  Service: Cardiovascular;  Laterality: N/A;    Family History: Family History  Problem Relation Age of Onset  . Diabetes Mother     Social History: Social History   Social History  . Marital status: Divorced    Spouse name: N/A  . Number of children: 3  . Years of education: N/A   Occupational History  . disabled    Social History Main Topics  . Smoking status: Current Every Day Smoker    Packs/day: 1.00    Years: 40.00    Types: Cigarettes  . Smokeless tobacco: Never Used  . Alcohol use No  . Drug use: No  . Sexual activity: No   Other Topics Concern  . None   Social History Narrative   Has an apartment with a roommate. He was living on the streets in 01/14/13.  He reports that his father died in [place] in 14-Jan-2013.  He is divorced.  He is no longer estranged from his son, but still from his daughter who lives locally.  Neither of his parents, nor any siblings have any history of CAD.    Allergies:  Allergies  Allergen Reactions  . Tape Itching    Paper tape please.    Objective:    Vital Signs:   Temp:  [97.9 F (36.6 C)-98.1 F (36.7 C)] 97.9 F (36.6 C) (08/18 0542) Pulse Rate:  [96-108] 108 (08/18 0542) Resp:  [18-21] 18 (08/18 0542) BP: (111-148)/(62-83) 117/83 (08/18 0542) SpO2:  [95 %-98 %] 98 % (08/18 0542) Weight:  [234 lb 6.4 oz (106.3 kg)] 234 lb 6.4 oz (106.3 kg) (08/18 0542) Last BM Date: 11/09/15  Weight change: Filed Weights   11/08/15  2342 11/09/15 0648 11/10/15 0542  Weight: 235 lb 11.2 oz (106.9 kg) 234 lb 3.2 oz (106.2 kg) 234 lb 6.4 oz (106.3 kg)    Intake/Output:   Intake/Output Summary (Last 24 hours) at 11/10/15 0816 Last data filed at 11/10/15 0651  Gross per 24 hour  Intake             1383 ml  Output             2220 ml  Net             -837 ml     Physical Exam: General:  Appears fatigued. No resp difficulty. In the chair HEENT: normal Neck: supple. JVP to ear. Carotids 2+ bilat; no bruits. No thyromegaly or nodule noted. Cor: PMI nondisplaced. Irregular rate & rhythm, tachy. No rubs, gallops or murmurs. Lungs: RML RLL LLL crackles Abdomen: soft, nontender, mild distention. No hepatosplenomegaly. No bruits or masses. Good bowel sounds. Extremities: no cyanosis, clubbing, rash. R and LLE 2+ edema  Neuro: alert & orientedx3, cranial nerves grossly intact. moves all 4 extremities w/o difficulty. Affect pleasant  Telemetry: , A fib 100s  Labs: Basic Metabolic Panel:  Recent Labs Lab 11/08/15 1450 11/09/15 0248 11/10/15 0317  NA 132* 132* 135  K 3.8 3.6 4.1  CL 94* 93* 96*  CO2 29 29 32  GLUCOSE 375* 355* 141*  BUN 23* 24* 27*  CREATININE 1.60* 1.55* 1.64*  CALCIUM 8.3* 8.2* 8.4*    Liver Function Tests:  Recent Labs Lab 11/10/15 0317  AST 17  ALT 15*  ALKPHOS 149*  BILITOT 1.6*  PROT 6.8  ALBUMIN 3.0*   No results for input(s): LIPASE, AMYLASE in the last 168 hours. No results for input(s): AMMONIA in the last 168 hours.  CBC:  Recent Labs Lab 11/08/15 1450 11/10/15 0317  WBC 7.3 8.3  NEUTROABS  --  6.3  HGB 13.4 12.8*  HCT 43.9 44.0  MCV 82.1 83.2  PLT 150 213    Cardiac Enzymes: No results for input(s): CKTOTAL, CKMB, CKMBINDEX, TROPONINI in the last 168 hours.  BNP: BNP (last 3 results)  Recent Labs  05/17/15 1559 08/15/15 1447 11/08/15 1450  BNP 777.8* 546.6* 748.2*    ProBNP (last 3 results)  Recent Labs  12/12/14 1614  PROBNP 1,892.00*      CBG:  Recent Labs Lab 11/09/15 0540 11/09/15 1227 11/09/15 1718 11/09/15 2102 11/10/15 0642  GLUCAP 356* 101* 254* 273* 218*    Coagulation Studies: No results for input(s): LABPROT, INR in the last 72 hours.  Other results: EKG: Afib   Imaging: Dg Chest 2 View  Result Date: 11/08/2015 CLINICAL DATA:  Shortness of breath abdominal fluid retention. EXAM: CHEST  2 VIEW COMPARISON:  Two-view chest x-ray 09/13/2015. FINDINGS: Cardiomegaly is stable. Mild edema is present. Right greater left pleural effusions and airspace disease are present. Pacing/defibrillator wire is stable. IMPRESSION: 1. Congestive heart failure. 2. Right greater than left basilar airspace disease. While this likely reflects atelectasis, infection or aspiration is also considered. Electronically Signed   By: Marin Robertshristopher  Mattern M.D.   On: 11/08/2015 16:13   [place] Abdomen Complete  Result Date: 11/09/2015 CLINICAL DATA:  Previous history of mild ascites EXAM: ABDOMEN ULTRASOUND COMPLETE COMPARISON:  05/14/2013 FINDINGS: Gallbladder: Gallbladder wall thickening is again noted without evidence of cholelithiasis. No significant pericholecystic fluid is noted. The wall thickening has improved slightly from the prior exam. Gallbladder sludge is noted. Common bile duct: Diameter: 3.3 mm. Liver: Mildly increased in echogenicity which may be related to fatty infiltration. No significant nodularity is noted. No mass lesion is seen. IVC: No abnormality visualized. Pancreas: Visualized portion unremarkable. Spleen: Size and appearance within normal limits. Right Kidney: Length: 10.6 cm. 3.2 cm cyst is noted in the upper pole stable from the prior exam. Left Kidney: Length: 9.4 cm. Some limited views likely decrease the size of the left kidney. Echogenicity within normal limits. No mass or hydronephrosis visualized. Abdominal aorta: No aneurysm visualized. Other findings: Small bilateral pleural effusions are noted. No significant  ascites is seen. IMPRESSION: Persistent but mildly improved gallbladder wall thickening. Stable right  renal cyst. Small bilateral pleural effusions are noted. No significant ascites is seen. Electronically Signed   By: Alcide Clever M.D.   On: 11/09/2015 07:35     Medications:     Current Medications: . amiodarone  200 mg Oral BID  . aspirin EC  81 mg Oral Daily  . atorvastatin  40 mg Oral q1800  . carvedilol  3.125 mg Oral BID WC  . digoxin  0.0625 mg Oral Daily  . ferrous sulfate  325 mg Oral Q breakfast  . furosemide  80 mg Intravenous BID  . insulin aspart  0-15 Units Subcutaneous TID WC  . isosorbide-hydrALAZINE  1 tablet Oral BID  . mometasone-formoterol  2 puff Inhalation BID  . nicotine  14 mg Transdermal Daily  . pantoprazole  40 mg Oral Daily  . rivaroxaban  20 mg Oral QAC supper  . sodium chloride flush  3 mL Intravenous Q12H  . spironolactone  25 mg Oral Daily    Infusions:     Assessment/Plan   Rochell Mabie is a 60 y.o. male with systolic CHF, Chronic Afib, DMII, H/o GI pleed, PAD, and history of non-compliance who presents with A/C systolic CHF in setting of medical non-compliance x 2 weeks.   1. Acute on chronic Systolic Heart Failure - ICM EF 20-25%. Has [employer] ICD.  NYHA IIIb symptoms in setting of medication non-compliance for at least 2 weeks.   - Sluggish diuresis noted. Add milrinone 0.25 mcg. Increase lasix to 80 mg three times daily.  Creatinine trending up but not a surprise as he has been off meds. Follow closely.  - Continue spiro 25 mg  - Continue dig 0.0625 mg.  - No BB with h/o cardiogenic shock and acute decompensation. - Continue bidil 1 tab tid.  2. Chronic A fib  - Continue Amio 200 mg BID and Xarelto. No bleeding problems. TSH ok.  - 3. DMII- Per PCP  4. H/O GI Bleed - duodenal ulcer with clip 9/5 - Watch closely while in hospital with resumption of Xarelto.CHeck CBC in am 5. Smoking- Current smoker. Counseled on smoking  cessation.  6. CAD: CABG in 2014 at [place], no chest pain.  Continue atorvastatin.  He is on Xarelto so does not have to take aspirin.  7. CKD: Stage III. Watch carefully with diuresis.    Pt is NOT a candidate for advanced therapies with social situation and non compliance.  Social- Followed by Paramedicine in the community.    Length of Stay: 1  Amy Clegg NP_C  11/10/2015, 8:16 AM  Advanced Heart Failure Team Pager [phone] (M-F; 7a - 4p)  Please contact [facility] for night-coverage after hours (4p -7a ) and weekends on [email or web address]  Patient seen and examined with Tonye Becket, NP. We discussed all aspects of the encounter. I agree with the assessment and plan as stated above.   He remains very volume overloaded. Poor response to IV lasix. Creatinine up slightly. Suspect possible low output. Agree with addition of milrinone. Continue IV lasix. Watch renal function and electrolytes closely. Continue amio and Xarelto for PAF (which he tolerates poorly). He is not candidate for advanced therapies. Hgb currently stable.   Girl Schissler,MD 7:34 PM

## 2015-11-11 DIAGNOSIS — I10 Essential (primary) hypertension: Secondary | ICD-10-CM

## 2015-11-11 LAB — BASIC METABOLIC PANEL
Anion gap: 10 (ref 5–15)
BUN: 30 mg/dL — ABNORMAL HIGH (ref 6–20)
CHLORIDE: 90 mmol/L — AB (ref 101–111)
CO2: 32 mmol/L (ref 22–32)
CREATININE: 1.63 mg/dL — AB (ref 0.61–1.24)
Calcium: 8.7 mg/dL — ABNORMAL LOW (ref 8.9–10.3)
GFR, EST AFRICAN AMERICAN: 52 mL/min — AB (ref >60)
GFR, EST NON AFRICAN AMERICAN: 45 mL/min — AB (ref >60)
Glucose, Bld: 242 mg/dL — ABNORMAL HIGH (ref 65–99)
Potassium: 4.8 mmol/L (ref 3.5–5.1)
SODIUM: 132 mmol/L — AB (ref 135–145)

## 2015-11-11 LAB — GLUCOSE, CAPILLARY
GLUCOSE-CAPILLARY: 220 mg/dL — AB (ref 65–99)
GLUCOSE-CAPILLARY: 305 mg/dL — AB (ref 65–99)
GLUCOSE-CAPILLARY: 175 mg/dL — AB (ref 65–99)
Glucose-Capillary: 90 mg/dL (ref 65–99)
Glucose-Capillary: 92 mg/dL (ref 65–99)

## 2015-11-11 LAB — CBC WITH DIFFERENTIAL/PLATELET
BASOS ABS: 0 10*3/uL (ref 0.0–0.1)
Basophils Relative: 0 %
EOS ABS: 0.1 10*3/uL (ref 0.0–0.7)
EOS PCT: 1 %
HCT: 43.5 % (ref 39.0–52.0)
Hemoglobin: 12.8 g/dL — ABNORMAL LOW (ref 13.0–17.0)
Lymphocytes Relative: 10 %
Lymphs Abs: 0.9 10*3/uL (ref 0.7–4.0)
MCH: 24.4 pg — ABNORMAL LOW (ref 26.0–34.0)
MCHC: 29.4 g/dL — ABNORMAL LOW (ref 30.0–36.0)
MCV: 83 fL (ref 78.0–100.0)
MONO ABS: 0.8 10*3/uL (ref 0.1–1.0)
Monocytes Relative: 9 %
NEUTROS PCT: 80 %
Neutro Abs: 6.8 10*3/uL (ref 1.7–7.7)
PLATELETS: 185 10*3/uL (ref 150–400)
RBC: 5.24 MIL/uL (ref 4.22–5.81)
RDW: 18.2 % — AB (ref 11.5–15.5)
WBC: 8.6 10*3/uL (ref 4.0–10.5)

## 2015-11-11 LAB — T3, FREE: T3 FREE: 2.3 pg/mL (ref 2.0–4.4)

## 2015-11-11 MED ORDER — INSULIN ASPART PROT & ASPART (70-30 MIX) 100 UNIT/ML ~~LOC~~ SUSP
15.0000 [IU] | Freq: Two times a day (BID) | SUBCUTANEOUS | Status: DC
Start: 2015-11-11 — End: 2015-11-14
  Administered 2015-11-11 – 2015-11-14 (×6): 15 [IU] via SUBCUTANEOUS
  Filled 2015-11-11: qty 10

## 2015-11-11 NOTE — Progress Notes (Addendum)
Advanced Heart Failure Team  Progress Note   Referring Physician: Dr. Melynda Ripple Primary Physician: [name], MD Primary Cardiologist:  Dr. Gala Romney   Reason for Consultation: A/C systolic HF  HPI:   Admitted with increased dyspnea and volume overload. Diuresing with IV lasix With addition of milrinone.  First question to me was "When can I leave".   Resting comfortably in his chair. No chest pain.     Home Medications Prior to Admission medications   Medication Sig Start Date End Date Taking? Authorizing Provider  albuterol (PROVENTIL HFA;VENTOLIN HFA) 108 (90 Base) MCG/ACT inhaler Inhale 2 puffs into the lungs every 6 (six) hours as needed for wheezing or shortness of breath. 03/29/15   Jaclyn Shaggy, MD  albuterol (PROVENTIL) (2.5 MG/3ML) 0.083% nebulizer solution Take 3 mLs (2.5 mg total) by nebulization every 6 (six) hours as needed for wheezing or shortness of breath. 04/18/15   Jaclyn Shaggy, MD  amiodarone (PACERONE) 200 MG tablet Take 1 tablet (200 mg total) by mouth 2 (two) times daily. 09/18/15   Amy D Filbert Schilder, NP  atorvastatin (LIPITOR) 40 MG tablet Take 1 tablet (40 mg total) by mouth daily at 6 PM. 09/18/15   Amy D Clegg, NP  Blood Glucose Monitoring Suppl (ACCU-CHEK AVIVA) device Use as instructed 3 times daily before meals 04/18/15   Jaclyn Shaggy, MD  carvedilol (COREG) 3.125 MG tablet Take 1 tablet (3.125 mg total) by mouth 2 (two) times daily with a meal. 09/18/15   Amy D Clegg, NP  digoxin (LANOXIN) 0.125 MG tablet Take 0.5 tablets (0.0625 mg total) by mouth daily. 09/18/15   Amy D Clegg, NP  ferrous sulfate 325 (65 FE) MG EC tablet Take 325 mg by mouth daily with breakfast.     Historical Provider, MD  Fluticasone-Salmeterol (ADVAIR) 100-50 MCG/DOSE AEPB Inhale 1 puff into the lungs daily.    Historical Provider, MD  glipiZIDE (GLUCOTROL) 10 MG tablet Take 1 tablet (10 mg total) by mouth 2 (two) times daily before a meal. 04/04/15   Jaclyn Shaggy, MD    isosorbide-hydrALAZINE (BIDIL) 20-37.5 MG tablet Take 1 tablet by mouth 2 (two) times daily. 09/18/15   Amy D Clegg, NP  pantoprazole (PROTONIX) 40 MG tablet Take 40 mg by mouth daily as needed.    Historical Provider, MD  rivaroxaban (XARELTO) 20 MG TABS tablet Take 1 tablet (20 mg total) by mouth daily before supper. 09/18/15   Amy D Filbert Schilder, NP  spironolactone (ALDACTONE) 25 MG tablet Take 1 tablet (25 mg total) by mouth daily. 09/18/15   Amy D Clegg, NP  torsemide (DEMADEX) 20 MG tablet Take 4 tablets (80 mg total) by mouth 2 (two) times daily. 09/18/15   Amy D Filbert Schilder, NP  traMADol (ULTRAM) 50 MG tablet Take 1 tablet (50 mg total) by mouth every 8 (eight) hours as needed for moderate pain. 01/30/15   Jaclyn Shaggy, MD    Past Medical History: Past Medical History:  Diagnosis Date  . Atrial fibrillation (HCC)    RVR 10/2014  . Automatic implantable cardioverter-defibrillator in situ   . CAD (coronary artery disease) Sept 2013   s/p cardiac cath showing occlusion of small RCA with collaterals  . CHF (congestive heart failure) (HCC)    20 to 25 % EF and RV dysfunction by 07/2014 echo   . Chronic anticoagulation    on xarelto.   . High cholesterol   . Hypertension   . Myocardial infarction (HCC) 2014  . Noncompliance  homelessness contributing.   . Peripheral arterial disease (HCC)   . Type II diabetes mellitus (HCC)     Past Surgical History: Past Surgical History:  Procedure Laterality Date  . CARDIAC CATHETERIZATION  09/2012  . CORONARY ANGIOPLASTY WITH STENT PLACEMENT  11/2011   "1"  . CORONARY ARTERY BYPASS GRAFT  09/2012   2 vessels per patient [name]   . ESOPHAGOGASTRODUODENOSCOPY N/A 11/28/2014   Procedure: ESOPHAGOGASTRODUODENOSCOPY (EGD);  Surgeon: Beverley Fiedler, MD;  Location: [facility] ENDOSCOPY;  Service: Endoscopy;  Laterality: N/A;  . ILIAC ARTERY STENT Right 08/30/2013  . IMPLANTABLE CARDIOVERTER DEFIBRILLATOR IMPLANT     [place] in 07/2012; [employer]  . LEFT AND RIGHT  HEART CATHETERIZATION WITH CORONARY ANGIOGRAM N/A 12/02/2011   Procedure: LEFT AND RIGHT HEART CATHETERIZATION WITH CORONARY ANGIOGRAM;  Surgeon: Kathleene Hazel, MD;  Location: [facility] CATH LAB;  Service: Cardiovascular;  Laterality: N/A;  . LOWER EXTREMITY ANGIOGRAM N/A 08/30/2013   Procedure: LOWER EXTREMITY ANGIOGRAM;  Surgeon: Runell Gess, MD;  Location: [facility] CATH LAB;  Service: Cardiovascular;  Laterality: N/A;  . LOWER EXTREMITY ANGIOGRAM N/A 12/02/2013   Procedure: LOWER EXTREMITY ANGIOGRAM;  Surgeon: Runell Gess, MD;  Location: [facility] CATH LAB;  Service: Cardiovascular;  Laterality: N/A;    Family History: Family History  Problem Relation Age of Onset  . Diabetes Mother     Social History: Social History   Social History  . Marital status: Divorced    Spouse name: N/A  . Number of children: 3  . Years of education: N/A   Occupational History  . disabled    Social History Main Topics  . Smoking status: Current Every Day Smoker    Packs/day: 1.00    Years: 40.00    Types: Cigarettes  . Smokeless tobacco: Never Used  . Alcohol use No  . Drug use: No  . Sexual activity: No   Other Topics Concern  . None   Social History Narrative   Has an apartment with a roommate. He was living on the streets in 2013-01-18.  He reports that his father died in [place] in January 18, 2013.  He is divorced.  He is no longer estranged from his son, but still from his daughter who lives locally.  Neither of his parents, nor any siblings have any history of CAD.    Allergies:  Allergies  Allergen Reactions  . Tape Itching    Paper tape please.    Objective:    Vital Signs:   Temp:  [97.5 F (36.4 C)-98.5 F (36.9 C)] 98.4 F (36.9 C) (08/19 1230) Pulse Rate:  [102-124] 124 (08/19 1230) Resp:  [18-20] 20 (08/19 1230) BP: (100-137)/(57-73) 100/61 (08/19 1230) SpO2:  [93 %-95 %] 95 % (08/19 1230) Weight:  [232 lb 11.2 oz (105.6 kg)] 232 lb 11.2 oz (105.6 kg) (08/19 0514) Last  BM Date: 11/09/15  Weight change: Filed Weights   11/09/15 0648 11/10/15 0542 11/11/15 0514  Weight: 234 lb 3.2 oz (106.2 kg) 234 lb 6.4 oz (106.3 kg) 232 lb 11.2 oz (105.6 kg)    Intake/Output:   Intake/Output Summary (Last 24 hours) at 11/11/15 1333 Last data filed at 11/11/15 1230  Gross per 24 hour  Intake            881.6 ml  Output             3975 ml  Net          -3093.4 ml     Physical  Exam: General:  Appears fatigued. No resp difficulty. In the chair. Very pungent odor in the room. HEENT: normal Neck: supple. JVP to ear. Carotids 2+ bilat; no bruits. No thyromegaly or nodule noted. Cor: PMI nondisplaced. Irregular rate & rhythm, tachy. No rubs, gallops or murmurs. Lungs: RML RLL LLL crackles, Reduced breath sounds at bases Abdomen: soft, nontender, mild distention. No hepatosplenomegaly. No bruits or masses. Good bowel sounds. Extremities: no cyanosis, clubbing, rash. R and LLE 2+ edema  Neuro: alert & orientedx3, cranial nerves grossly intact. moves all 4 extremities w/o difficulty. Affect pleasant  Telemetry: , A fib 100s  Labs: Basic Metabolic Panel:  Recent Labs Lab 11/08/15 1450 11/09/15 0248 11/10/15 0317 11/11/15 0238  NA 132* 132* 135 132*  K 3.8 3.6 4.1 4.8  CL 94* 93* 96* 90*  CO2 29 29 32 32  GLUCOSE 375* 355* 141* 242*  BUN 23* 24* 27* 30*  CREATININE 1.60* 1.55* 1.64* 1.63*  CALCIUM 8.3* 8.2* 8.4* 8.7*    Liver Function Tests:  Recent Labs Lab 11/10/15 0317  AST 17  ALT 15*  ALKPHOS 149*  BILITOT 1.6*  PROT 6.8  ALBUMIN 3.0*   No results for input(s): LIPASE, AMYLASE in the last 168 hours. No results for input(s): AMMONIA in the last 168 hours.  CBC:  Recent Labs Lab 11/08/15 1450 11/10/15 0317 11/11/15 0238  WBC 7.3 8.3 8.6  NEUTROABS  --  6.3 6.8  HGB 13.4 12.8* 12.8*  HCT 43.9 44.0 43.5  MCV 82.1 83.2 83.0  PLT 150 213 185    Cardiac Enzymes: No results for input(s): CKTOTAL, CKMB, CKMBINDEX, TROPONINI in the  last 168 hours.  BNP: BNP (last 3 results)  Recent Labs  05/17/15 1559 08/15/15 1447 11/08/15 1450  BNP 777.8* 546.6* 748.2*    ProBNP (last 3 results)  Recent Labs  12/12/14 1614  PROBNP 1,892.00*     CBG:  Recent Labs Lab 11/10/15 1137 11/10/15 1609 11/10/15 2153 11/11/15 0547 11/11/15 1105  GLUCAP 239* 241* 298* 220* 305*    Coagulation Studies: No results for input(s): LABPROT, INR in the last 72 hours.  Other results: EKG: Afib   Imaging: No results found.   Medications:     Current Medications: . amiodarone  200 mg Oral BID  . aspirin EC  81 mg Oral Daily  . atorvastatin  40 mg Oral q1800  . carvedilol  3.125 mg Oral BID WC  . digoxin  0.0625 mg Oral Daily  . ferrous sulfate  325 mg Oral Q breakfast  . furosemide  80 mg Intravenous Q8H  . insulin aspart  0-15 Units Subcutaneous TID WC  . isosorbide-hydrALAZINE  1 tablet Oral BID  . mometasone-formoterol  2 puff Inhalation BID  . nicotine  14 mg Transdermal Daily  . pantoprazole  40 mg Oral Daily  . rivaroxaban  20 mg Oral QAC supper  . sodium chloride flush  3 mL Intravenous Q12H  . spironolactone  25 mg Oral Daily    Infusions: . milrinone 0.25 mcg/kg/min (11/11/15 1050)     Assessment/Plan   Jacob [name] is a 60 y.o. male with systolic CHF, Chronic Afib, DMII, H/o GI pleed, PAD, and history of non-compliance who presents with A/C systolic CHF in setting of medical non-compliance x 2 weeks.   1. Acute on chronic Systolic Heart Failure - ICM EF 20-25%. Has [employer] ICD.  NYHA IIIb symptoms in setting of medication non-compliance for at least 2 weeks.   - Sluggish diuresis  noted. Added milrinone 0.25 mcg on 11/10/15. Increased lasix to 80 mg three times daily.  - Weight 235 on admission now 232 - Out 4.5 L in total, 2 L yesterday 1.1 L today excellent Creatinine trending up but now stable at 1.63.  Follow closely.  - Continue spiro 25 mg  - Continue dig 0.0625 mg.  - No BB  with h/o cardiogenic shock and acute decompensation. - Continue bidil 1 tab tid.  2. Chronic A fib  - Continue Amio 200 mg BID and Xarelto. No bleeding problems. TSH ok.  - 3. DMII- Per PCP  4. H/O GI Bleed - duodenal ulcer with clip 9/5 - Watch closely while in hospital with resumption of Xarelto.hemoglobin currently 13.4 down to 12.8. Relatively stable. 5. Smoking- Current smoker. Counseled on smoking cessation.  6. CAD: CABG in 2014 at [facility], no chest pain.  Continue atorvastatin.  He is on Xarelto so does not have to take aspirin.  7. CKD: Stage III. Watch carefully with diuresis.    Pt is NOT a candidate for advanced therapies with social situation and non compliance.Does not tolerate amiodarone well.  Encouraged him to stay longer to allow for decongestion. Make sure that he is getting good hygiene here in the hospital.  Social- Followed by Paramedicine in the community.    Length of Stay: 2   Mark Skains,MD 1:33 PM

## 2015-11-11 NOTE — Progress Notes (Signed)
PROGRESS NOTE    Jacob [name]  EAV:[id number] DOB: October 24, 1955 DOA: 11/08/2015 PCP: Jacob [name]   Brief Narrative: Mr. Jacob [name] is a 60yo man who presented with acute exacerbation of HFrEF.  He has medical compliance issues due to cost and has not been taking his medications apparently.  He came in with volume overload, edema, elevated JVP.  Heart failure team is also following and has him on a milrinone drip.     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  He has mild AKI previously and was not on ACE-I - IV lasix without much diuresis, so HF team placed on milrinone - I/O for 24 hours - 544/1500.  Net negative 4.5 liters - Weight down 2 pounds today to 232 - Lasix 80mg  TID - Continue spironolactone, digoxin - Follow cardiology recommendations.  - AICD in place.   AKI (acute kidney injury) - Cr relatively stable at 1.63 - Trend daily - Possibly related to inadequate diuresis.  However, BUN is rising and 30 today.   Essential hypertension - BP well controlled at 100s-110s systolic - Continue diuresis, coreg, isosorbide-hydralazine  AF (atrial fibrillation)  - HR slightly high, > 100 - Would not increase beta blocker at this time given acute heart failure.  - Continue dig/amiodarone - Continue xarelto    Tobacco abuse - Counsel on cessation     DM (diabetes mellitus), type 2 with peripheral vascular complications - CBG ranging from 220s - 300s - On glipizide at home - SSI while here - Reviewed DM coordinator note and recommended 70/30 15 units per day, will order.    Coronary artery disease - See above, he is on adequate therapy.  - Aspirin, atorvastatin    DVT prophylaxis: Xarelto Code Status: Full  Disposition Plan: Pending better diuresis   Consultants:    Cardiology  Procedures:   None  Antimicrobials:  None    Subjective: Asking to go home, reports breathing improved, swelling stable.   Objective: Vitals:   11/10/15 1750 11/10/15 2039 11/11/15 0029 11/11/15 0514  BP: 137/72 (!) 107/58 112/73 111/68  Pulse: (!) 118 (!) 102 (!) 105 (!) 107  Resp:  18 18 18   Temp:  97.6 F (36.4 C) 97.5 F (36.4 C) 98.1 F (36.7 C)  TempSrc:  Oral Oral Oral  SpO2:  94% 93% 95%  Weight:    232 lb 11.2 oz (105.6 kg)  Height:        Intake/Output Summary (Last 24 hours) at 11/11/15 0719 Last data filed at 11/11/15 0717  Gross per 24 hour  Intake            761.6 ml  Output             3425 ml  Net          -2663.4 ml   Filed Weights   11/09/15 0648 11/10/15 0542 11/11/15 0514  Weight: 234 lb 3.2 oz (106.2 kg) 234 lb 6.4 oz (106.3 kg) 232 lb 11.2 oz (105.6 kg)    Examination:  General exam: Appears calm and comfortable, sleeping when I came in room, asking to go home once awake.  Respiratory system: Crackles at bases.  No wheezing. Respiratory effort normal. Cardiovascular system: S1 & S2 heard, Irreg Irreg. JVD to neck.  LE edema to knees, 1+  pitting Gastrointestinal system: Abdomen is distended but soft and nontender.  Central nervous system: Alert and oriented.  Extremities: Edema as noted above, no cyanosis Skin: No rashes, lesions or ulcers Psychiatry: Judgement and insight appear normal. Mood is depressed.     Data Reviewed:   CBC:  Recent Labs Lab 11/08/15 1450 11/10/15 0317 11/11/15 0238  WBC 7.3 8.3 8.6  NEUTROABS  --  6.3 6.8  HGB 13.4 12.8* 12.8*  HCT 43.9 44.0 43.5  MCV 82.1 83.2 83.0  PLT 150 213 185   Basic Metabolic Panel:  Recent Labs Lab 11/08/15 1450 11/09/15 0248 11/10/15 0317 11/11/15 0238  NA 132* 132* 135 132*  K 3.8 3.6 4.1 4.8  CL 94* 93* 96* 90*  CO2 29 29 32 32  GLUCOSE 375* 355* 141* 242*  BUN 23* 24* 27* 30*  CREATININE 1.60* 1.55* 1.64* 1.63*  CALCIUM 8.3* 8.2* 8.4* 8.7*    GFR: Estimated Creatinine Clearance: 53.4 mL/min (by C-G formula based on SCr of 1.63 mg/dL). Liver Function Tests:  Recent Labs Lab 11/10/15 0317  AST 17  ALT 15*  ALKPHOS 149*  BILITOT 1.6*  PROT 6.8  ALBUMIN 3.0*   No results for input(s): LIPASE, AMYLASE in the last 168 hours. No results for input(s): AMMONIA in the last 168 hours. Coagulation Profile: No results for input(s): INR, PROTIME in the last 168 hours. Cardiac Enzymes: No results for input(s): CKTOTAL, CKMB, CKMBINDEX, TROPONINI in the last 168 hours. BNP (last 3 results)  Recent Labs  12/12/14 1614  PROBNP 1,892.00*   HbA1C:  Recent Labs  11/09/15 0248  HGBA1C 11.0*   CBG:  Recent Labs Lab 11/10/15 0642 11/10/15 1137 11/10/15 1609 11/10/15 2153 11/11/15 0547  GLUCAP 218* 239* 241* 298* 220*   Lipid Profile: No results for input(s): CHOL, HDL, LDLCALC, TRIG, CHOLHDL, LDLDIRECT in the last 72 hours. Thyroid Function Tests:  Recent Labs  11/10/15 0314 11/10/15 0315  TSH  --  3.035  T3FREE 2.3  --    Anemia Panel: No results for input(s): VITAMINB12, FOLATE, FERRITIN, TIBC, IRON, RETICCTPCT in the last 72 hours. Sepsis Labs: No results for input(s): PROCALCITON, LATICACIDVEN in the last 168 hours.  No results found for this or any previous visit (from the past 240 hour(s)).       Radiology Studies: No results found.      Scheduled Meds: . amiodarone  200 mg Oral BID  . aspirin EC  81 mg Oral Daily  . atorvastatin  40 mg Oral q1800  . carvedilol  3.125 mg Oral BID WC  . digoxin  0.0625 mg Oral Daily  . ferrous sulfate  325 mg Oral Q breakfast  . furosemide  80 mg Intravenous Q8H  . insulin aspart  0-15 Units Subcutaneous TID WC  . isosorbide-hydrALAZINE  1 tablet Oral BID  . mometasone-formoterol  2 puff Inhalation BID  . nicotine  14 mg Transdermal Daily  . pantoprazole  40 mg Oral Daily  . rivaroxaban  20 mg Oral QAC supper  . sodium chloride flush  3 mL  Intravenous Q12H  . spironolactone  25 mg Oral Daily   Continuous Infusions: . milrinone 0.25 mcg/kg/min (11/10/15 2018)     LOS: 2 days    Time spent: 25 minutes    Debe Coder, [name] [facility] Pager [phone]  If 7PM-7AM, please contact night-coverage [email or web address] Password [facility] 11/11/2015, 7:19 AM

## 2015-11-12 DIAGNOSIS — I2510 Atherosclerotic heart disease of native coronary artery without angina pectoris: Secondary | ICD-10-CM

## 2015-11-12 LAB — CBC WITH DIFFERENTIAL/PLATELET
Basophils Absolute: 0 10*3/uL (ref 0.0–0.1)
Basophils Relative: 0 %
EOS ABS: 0.1 10*3/uL (ref 0.0–0.7)
Eosinophils Relative: 1 %
HEMATOCRIT: 41 % (ref 39.0–52.0)
HEMOGLOBIN: 12.3 g/dL — AB (ref 13.0–17.0)
LYMPHS ABS: 1.1 10*3/uL (ref 0.7–4.0)
LYMPHS PCT: 10 %
MCH: 24.8 pg — AB (ref 26.0–34.0)
MCHC: 30 g/dL (ref 30.0–36.0)
MCV: 82.7 fL (ref 78.0–100.0)
MONOS PCT: 10 %
Monocytes Absolute: 1.1 10*3/uL — ABNORMAL HIGH (ref 0.1–1.0)
NEUTROS ABS: 8.8 10*3/uL — AB (ref 1.7–7.7)
NEUTROS PCT: 79 %
Platelets: 209 10*3/uL (ref 150–400)
RBC: 4.96 MIL/uL (ref 4.22–5.81)
RDW: 18.1 % — ABNORMAL HIGH (ref 11.5–15.5)
WBC: 11.1 10*3/uL — AB (ref 4.0–10.5)

## 2015-11-12 LAB — BASIC METABOLIC PANEL
Anion gap: 12 (ref 5–15)
BUN: 38 mg/dL — AB (ref 6–20)
CHLORIDE: 87 mmol/L — AB (ref 101–111)
CO2: 32 mmol/L (ref 22–32)
Calcium: 8.9 mg/dL (ref 8.9–10.3)
Creatinine, Ser: 1.99 mg/dL — ABNORMAL HIGH (ref 0.61–1.24)
GFR calc Af Amer: 41 mL/min — ABNORMAL LOW (ref >60)
GFR calc non Af Amer: 35 mL/min — ABNORMAL LOW (ref >60)
Glucose, Bld: 254 mg/dL — ABNORMAL HIGH (ref 65–99)
POTASSIUM: 5 mmol/L (ref 3.5–5.1)
SODIUM: 131 mmol/L — AB (ref 135–145)

## 2015-11-12 LAB — GLUCOSE, CAPILLARY
GLUCOSE-CAPILLARY: 214 mg/dL — AB (ref 65–99)
GLUCOSE-CAPILLARY: 180 mg/dL — AB (ref 65–99)
Glucose-Capillary: 158 mg/dL — ABNORMAL HIGH (ref 65–99)
Glucose-Capillary: 169 mg/dL — ABNORMAL HIGH (ref 65–99)

## 2015-11-12 MED ORDER — ZOLPIDEM TARTRATE 5 MG PO TABS: 5.0000 mg | ORAL_TABLET | Freq: Every evening | ORAL | Status: DC | PRN

## 2015-11-12 MED ORDER — ZOLPIDEM TARTRATE 5 MG PO TABS
5.0000 mg | ORAL_TABLET | Freq: Every evening | ORAL | Status: DC | PRN
  Administered 2015-11-12 – 2015-11-13 (×3): 5 mg via ORAL
  Filled 2015-11-12 (×3): qty 1

## 2015-11-12 NOTE — Progress Notes (Signed)
Advanced Heart Failure Team  Progress Note   Referring Physician: Dr. Melynda Ripple Primary Physician: [name], MD Primary Cardiologist:  Dr. Gala Romney   Reason for Consultation: A/C systolic HF  HPI:   Admitted with increased dyspnea and volume overload. Diuresing with IV lasix With addition of milrinone.  First question to me was "When can I leave".   Resting comfortably in his chair. No chest pain.     Home Medications Prior to Admission medications   Medication Sig Start Date End Date Taking? Authorizing Provider  albuterol (PROVENTIL HFA;VENTOLIN HFA) 108 (90 Base) MCG/ACT inhaler Inhale 2 puffs into the lungs every 6 (six) hours as needed for wheezing or shortness of breath. 03/29/15   Jaclyn Shaggy, MD  albuterol (PROVENTIL) (2.5 MG/3ML) 0.083% nebulizer solution Take 3 mLs (2.5 mg total) by nebulization every 6 (six) hours as needed for wheezing or shortness of breath. 04/18/15   Jaclyn Shaggy, MD  amiodarone (PACERONE) 200 MG tablet Take 1 tablet (200 mg total) by mouth 2 (two) times daily. 09/18/15   Amy D Filbert Schilder, NP  atorvastatin (LIPITOR) 40 MG tablet Take 1 tablet (40 mg total) by mouth daily at 6 PM. 09/18/15   Amy D Clegg, NP  Blood Glucose Monitoring Suppl (ACCU-CHEK AVIVA) device Use as instructed 3 times daily before meals 04/18/15   Jaclyn Shaggy, MD  carvedilol (COREG) 3.125 MG tablet Take 1 tablet (3.125 mg total) by mouth 2 (two) times daily with a meal. 09/18/15   Amy D Clegg, NP  digoxin (LANOXIN) 0.125 MG tablet Take 0.5 tablets (0.0625 mg total) by mouth daily. 09/18/15   Amy D Clegg, NP  ferrous sulfate 325 (65 FE) MG EC tablet Take 325 mg by mouth daily with breakfast.     Historical Provider, MD  Fluticasone-Salmeterol (ADVAIR) 100-50 MCG/DOSE AEPB Inhale 1 puff into the lungs daily.    Historical Provider, MD  glipiZIDE (GLUCOTROL) 10 MG tablet Take 1 tablet (10 mg total) by mouth 2 (two) times daily before a meal. 04/04/15   Jaclyn Shaggy, MD    isosorbide-hydrALAZINE (BIDIL) 20-37.5 MG tablet Take 1 tablet by mouth 2 (two) times daily. 09/18/15   Amy D Clegg, NP  pantoprazole (PROTONIX) 40 MG tablet Take 40 mg by mouth daily as needed.    Historical Provider, MD  rivaroxaban (XARELTO) 20 MG TABS tablet Take 1 tablet (20 mg total) by mouth daily before supper. 09/18/15   Amy D Filbert Schilder, NP  spironolactone (ALDACTONE) 25 MG tablet Take 1 tablet (25 mg total) by mouth daily. 09/18/15   Amy D Clegg, NP  torsemide (DEMADEX) 20 MG tablet Take 4 tablets (80 mg total) by mouth 2 (two) times daily. 09/18/15   Amy D Filbert Schilder, NP  traMADol (ULTRAM) 50 MG tablet Take 1 tablet (50 mg total) by mouth every 8 (eight) hours as needed for moderate pain. 01/30/15   Jaclyn Shaggy, MD    Past Medical History: Past Medical History:  Diagnosis Date  . Atrial fibrillation (HCC)    RVR 10/2014  . Automatic implantable cardioverter-defibrillator in situ   . CAD (coronary artery disease) Sept 2013   s/p cardiac cath showing occlusion of small RCA with collaterals  . CHF (congestive heart failure) (HCC)    20 to 25 % EF and RV dysfunction by 07/2014 echo   . Chronic anticoagulation    on xarelto.   . High cholesterol   . Hypertension   . Myocardial infarction (HCC) 2014  . Noncompliance  homelessness contributing.   . Peripheral arterial disease (HCC)   . Type II diabetes mellitus (HCC)     Past Surgical History: Past Surgical History:  Procedure Laterality Date  . CARDIAC CATHETERIZATION  09/2012  . CORONARY ANGIOPLASTY WITH STENT PLACEMENT  11/2011   "1"  . CORONARY ARTERY BYPASS GRAFT  09/2012   2 vessels per patient [name]   . ESOPHAGOGASTRODUODENOSCOPY N/A 11/28/2014   Procedure: ESOPHAGOGASTRODUODENOSCOPY (EGD);  Surgeon: Beverley Fiedler, MD;  Location: [facility] ENDOSCOPY;  Service: Endoscopy;  Laterality: N/A;  . ILIAC ARTERY STENT Right 08/30/2013  . IMPLANTABLE CARDIOVERTER DEFIBRILLATOR IMPLANT     [place] in 07/2012; [employer]  . LEFT AND RIGHT  HEART CATHETERIZATION WITH CORONARY ANGIOGRAM N/A 12/02/2011   Procedure: LEFT AND RIGHT HEART CATHETERIZATION WITH CORONARY ANGIOGRAM;  Surgeon: Kathleene Hazel, MD;  Location[facility] CATH LAB;  Service: Cardiovascular;  Laterality: N/A;  . LOWER EXTREMITY ANGIOGRAM N/A 08/30/2013   Procedure: LOWER EXTREMITY ANGIOGRAM;  Surgeon: Runell Gess, MD;  Location: [facility] CATH LAB;  Service: Cardiovascular;  Laterality: N/A;  . LOWER EXTREMITY ANGIOGRAM N/A 12/02/2013   Procedure: LOWER EXTREMITY ANGIOGRAM;  Surgeon: Runell Gess, MD;  Location: [facility] CATH LAB;  Service: Cardiovascular;  Laterality: N/A;    Family History: Family History  Problem Relation Age of Onset  . Diabetes Mother     Social History: Social History   Social History  . Marital status: Divorced    Spouse name: N/A  . Number of children: 3  . Years of education: N/A   Occupational History  . disabled    Social History Main Topics  . Smoking status: Current Every Day Smoker    Packs/day: 1.00    Years: 40.00    Types: Cigarettes  . Smokeless tobacco: Never Used  . Alcohol use No  . Drug use: No  . Sexual activity: No   Other Topics Concern  . None   Social History Narrative   Has an apartment with a roommate. He was living on the streets in 2013/01/01.  He reports that his father died in [place] in 2013-01-01.  He is divorced.  He is no longer estranged from his son, but still from his daughter who lives locally.  Neither of his parents, nor any siblings have any history of CAD.    Allergies:  Allergies  Allergen Reactions  . Tape Itching    Paper tape please.    Objective:    Vital Signs:   Temp:  [97.4 F (36.3 C)-98.6 F (37 C)] 97.4 F (36.3 C) (08/20 1209) Pulse Rate:  [62-124] 97 (08/20 1209) Resp:  [20] 20 (08/20 1209) BP: (101-115)/(57-85) 101/64 (08/20 1209) SpO2:  [92 %-97 %] 94 % (08/20 1209) Weight:  [227 lb 3.2 oz (103.1 kg)] 227 lb 3.2 oz (103.1 kg) (08/20 0551) Last BM Date:  11/12/15  Weight change: Filed Weights   11/10/15 0542 11/11/15 0514 11/12/15 0551  Weight: 234 lb 6.4 oz (106.3 kg) 232 lb 11.2 oz (105.6 kg) 227 lb 3.2 oz (103.1 kg)    Intake/Output:   Intake/Output Summary (Last 24 hours) at 11/12/15 1334 Last data filed at 11/12/15 1210  Gross per 24 hour  Intake          1489.33 ml  Output             5375 ml  Net         -3885.67 ml     Physical Exam: General:  Appears fatigued. No resp difficulty. In the chair. Very pungent odor in the room. HEENT: normal Neck: supple. JVP to ear. Carotids 2+ bilat; no bruits. No thyromegaly or nodule noted. Cor: PMI nondisplaced. Irregular rate & rhythm, tachy. No rubs, gallops or murmurs. Lungs: RML RLL LLL crackles, Reduced breath sounds at bases Abdomen: soft, nontender, mild distention. No hepatosplenomegaly. No bruits or masses. Good bowel sounds. Extremities: no cyanosis, clubbing, rash. R and LLE 2+ edema  Neuro: alert & orientedx3, cranial nerves grossly intact. moves all 4 extremities w/o difficulty. Affect pleasant  Telemetry: , A fib 100s  Labs: Basic Metabolic Panel:  Recent Labs Lab 11/08/15 1450 11/09/15 0248 11/10/15 0317 11/11/15 0238 11/12/15 0200  NA 132* 132* 135 132* 131*  K 3.8 3.6 4.1 4.8 5.0  CL 94* 93* 96* 90* 87*  CO2 29 29 32 32 32  GLUCOSE 375* 355* 141* 242* 254*  BUN 23* 24* 27* 30* 38*  CREATININE 1.60* 1.55* 1.64* 1.63* 1.99*  CALCIUM 8.3* 8.2* 8.4* 8.7* 8.9    Liver Function Tests:  Recent Labs Lab 11/10/15 0317  AST 17  ALT 15*  ALKPHOS 149*  BILITOT 1.6*  PROT 6.8  ALBUMIN 3.0*   No results for input(s): LIPASE, AMYLASE in the last 168 hours. No results for input(s): AMMONIA in the last 168 hours.  CBC:  Recent Labs Lab 11/08/15 1450 11/10/15 0317 11/11/15 0238 11/12/15 0200  WBC 7.3 8.3 8.6 11.1*  NEUTROABS  --  6.3 6.8 8.8*  HGB 13.4 12.8* 12.8* 12.3*  HCT 43.9 44.0 43.5 41.0  MCV 82.1 83.2 83.0 82.7  PLT 150 213 185 209     Cardiac Enzymes: No results for input(s): CKTOTAL, CKMB, CKMBINDEX, TROPONINI in the last 168 hours.  BNP: BNP (last 3 results)  Recent Labs  05/17/15 1559 08/15/15 1447 11/08/15 1450  BNP 777.8* 546.6* 748.2*    ProBNP (last 3 results)  Recent Labs  12/12/14 1614  PROBNP 1,892.00*     CBG:  Recent Labs Lab 11/11/15 1634 11/11/15 2042 11/11/15 2123 11/12/15 0701 11/12/15 1142  GLUCAP 175* 92 90 214* 158*    Coagulation Studies: No results for input(s): LABPROT, INR in the last 72 hours.  Other results: EKG: Afib   Imaging: No results found.   Medications:     Current Medications: . amiodarone  200 mg Oral BID  . aspirin EC  81 mg Oral Daily  . atorvastatin  40 mg Oral q1800  . carvedilol  3.125 mg Oral BID WC  . digoxin  0.0625 mg Oral Daily  . ferrous sulfate  325 mg Oral Q breakfast  . furosemide  80 mg Intravenous Q8H  . insulin aspart  0-15 Units Subcutaneous TID WC  . insulin aspart protamine- aspart  15 Units Subcutaneous BID WC  . isosorbide-hydrALAZINE  1 tablet Oral BID  . mometasone-formoterol  2 puff Inhalation BID  . nicotine  14 mg Transdermal Daily  . pantoprazole  40 mg Oral Daily  . rivaroxaban  20 mg Oral QAC supper  . sodium chloride flush  3 mL Intravenous Q12H  . spironolactone  25 mg Oral Daily    Infusions: . milrinone 0.25 mcg/kg/min (11/12/15 1058)     Assessment/Plan   Khole Branch is a 60 y.o. male with systolic CHF, Chronic Afib, DMII, H/o GI pleed, PAD, and history of non-compliance who presents with A/C systolic CHF in setting of medical non-compliance x 2 weeks.   1. Acute on chronic Systolic Heart Failure -  ICM EF 20-25%. Has [employer] ICD.  NYHA IIIb symptoms in setting of medication non-compliance for at least 2 weeks.   - Sluggish diuresis noted. Added milrinone 0.25 mcg on 11/10/15. Increased lasix to 80 mg three times daily.  - Weight 235 on admission now 227 - Out 8.4 L in total, 4L  yesterday 1.1 L today excellent Creatinine trending up from 1.63  to 1.8.  Follow closely. Tomorrow, we may have reached threshold on diuresis with milrinone. - Continue spiro 25 mg  - Continue dig 0.0625 mg.  - No BB with h/o cardiogenic shock and acute decompensation. - Continue bidil 1 tab tid.  2. Chronic A fib  - Continue Amio 200 mg BID and Xarelto. No bleeding problems. TSH ok.  - 3. DMII- Per PCP  4. H/O GI Bleed - duodenal ulcer with clip 9/5 - Watch closely while in hospital with resumption of Xarelto.hemoglobin currently 13.4 down to 12.8-12.3. Relatively stable. 5. Smoking- Current smoker. Counseled on smoking cessation.  6. CAD: CABG in 2014 at [facility], no chest pain.  Continue atorvastatin.  He is on Xarelto so does not have to take aspirin.  7. CKD: Stage III. Watch carefully with diuresis.    Pt is NOT a candidate for advanced therapies with social situation and non compliance.Does not tolerate amiodarone well.  Encouraged him to stay longer to allow for decongestion. Make sure that he is getting good hygiene here in the hospital.  Social- Followed by Paramedicine in the community.    Length of Stay: 3   Mehtab Dolberry,MD 1:34 PM

## 2015-11-12 NOTE — Progress Notes (Signed)
PROGRESS NOTE    Jacob PunchesYahia [name]  ZOX:[id number] DOB: 12/20/1955 DOA: 11/08/2015 PCP: Jacob LewandowskyJEGEDE, OLUGBEMIGA, MD   Brief Narrative: Mr. Jacob Edwardsbdo is a 60yo man who presented with acute exacerbation of HFrEF.  He has medical compliance issues due to cost and has not been taking his medications apparently.  He came in with volume overload, edema, elevated JVP.  Heart failure team is also following and has him on a milrinone drip.     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   - On lasix TID and milrinone - I/O for 24 hours - 1368/5400.  Net negative about 8 litiers - Weight down 5 pounds today - Lasix 80mg  TID - Continue spironolactone, digoxin - Follow cardiology recommendations, may stop milrinone tomorrow.  - AICD in place.  - K rising to 5.0 today, consider decrease/stop spironolactone tomorrow.   AKI (acute kidney injury) - Cr bumped to 1.99 today - Likely stop milrinone tomorrow  - Trend daily   Essential hypertension - BP well controlled at 100s-110s systolic - Continue diuresis, coreg, isosorbide-hydralazine  AF (atrial fibrillation)  - HR slightly high, 90s-100s - Continue coreg - Continue dig/amiodarone - Continue xarelto    Tobacco abuse - Counsel on cessation     DM (diabetes mellitus), type 2 with peripheral vascular complications - CBG improved to 150s-240s with addition of novolog 70/30 - On glipizide at home - SSI while here - Novolog 70/30 15 units per day   Coronary artery disease - See above, he is on adequate therapy.  - Aspirin, atorvastatin  Anemia - Mild decrease to around 12, monitor while on xarelto    DVT prophylaxis: Xarelto Code Status: Full  Disposition Plan: Pending better diuresis   Consultants:   Cardiology  Procedures:   None  Antimicrobials:  None     Subjective: Reports feeling better today, breathing better.   Objective: Vitals:   11/12/15 0551 11/12/15 0737 11/12/15 1055 11/12/15 1209  BP: 115/85  105/62 101/64  Pulse: 62  (!) 115 97  Resp: 20   20  Temp: 98.4 F (36.9 C)   97.4 F (36.3 C)  TempSrc: Oral   Oral  SpO2: 97% 96% 92% 94%  Weight: 227 lb 3.2 oz (103.1 kg)     Height:        Intake/Output Summary (Last 24 hours) at 11/12/15 1426 Last data filed at 11/12/15 1400  Gross per 24 hour  Intake             1512 ml  Output             5225 ml  Net            -3713 ml   Filed Weights   11/10/15 0542 11/11/15 0514 11/12/15 0551  Weight: 234 lb 6.4 oz (106.3 kg) 232 lb 11.2 oz (105.6 kg) 227 lb 3.2 oz (103.1 kg)    Examination:  General exam: Appears calm and comfortable Respiratory system: Crackles at bases.  No wheezing. Respiratory effort normal. Cardiovascular system: S1 & S2 heard, Irreg Irreg. JVD to jawline.  LE edema to knees, improved on the left, about the same on the right.   Gastrointestinal system: Abdomen is distended but soft and nontender.  Central nervous system: Alert and oriented.  Extremities: Edema as noted above, no  cyanosis Skin: No rashes, lesions or ulcers Psychiatry: Judgement and insight appear normal. Mood is somewhat improved today.     Data Reviewed:   CBC:  Recent Labs Lab 11/08/15 1450 11/10/15 0317 11/11/15 0238 11/12/15 0200  WBC 7.3 8.3 8.6 11.1*  NEUTROABS  --  6.3 6.8 8.8*  HGB 13.4 12.8* 12.8* 12.3*  HCT 43.9 44.0 43.5 41.0  MCV 82.1 83.2 83.0 82.7  PLT 150 213 185 209   Basic Metabolic Panel:  Recent Labs Lab 11/08/15 1450 11/09/15 0248 11/10/15 0317 11/11/15 0238 11/12/15 0200  NA 132* 132* 135 132* 131*  K 3.8 3.6 4.1 4.8 5.0  CL 94* 93* 96* 90* 87*  CO2 29 29 32 32 32  GLUCOSE 375* 355* 141* 242* 254*  BUN 23* 24* 27* 30* 38*  CREATININE 1.60* 1.55* 1.64* 1.63* 1.99*  CALCIUM 8.3* 8.2* 8.4* 8.7* 8.9   GFR: Estimated Creatinine  Clearance: 43.2 mL/min (by C-G formula based on SCr of 1.99 mg/dL). Liver Function Tests:  Recent Labs Lab 11/10/15 0317  AST 17  ALT 15*  ALKPHOS 149*  BILITOT 1.6*  PROT 6.8  ALBUMIN 3.0*   No results for input(s): LIPASE, AMYLASE in the last 168 hours. No results for input(s): AMMONIA in the last 168 hours. Coagulation Profile: No results for input(s): INR, PROTIME in the last 168 hours. Cardiac Enzymes: No results for input(s): CKTOTAL, CKMB, CKMBINDEX, TROPONINI in the last 168 hours. BNP (last 3 results)  Recent Labs  12/12/14 1614  PROBNP 1,892.00*   HbA1C: No results for input(s): HGBA1C in the last 72 hours. CBG:  Recent Labs Lab 11/11/15 1634 11/11/15 2042 11/11/15 2123 11/12/15 0701 11/12/15 1142  GLUCAP 175* 92 90 214* 158*   Lipid Profile: No results for input(s): CHOL, HDL, LDLCALC, TRIG, CHOLHDL, LDLDIRECT in the last 72 hours. Thyroid Function Tests:  Recent Labs  11/10/15 0314 11/10/15 0315  TSH  --  3.035  T3FREE 2.3  --    Anemia Panel: No results for input(s): VITAMINB12, FOLATE, FERRITIN, TIBC, IRON, RETICCTPCT in the last 72 hours. Sepsis Labs: No results for input(s): PROCALCITON, LATICACIDVEN in the last 168 hours.  No results found for this or any previous visit (from the past 240 hour(s)).       Radiology Studies: No results found.      Scheduled Meds: . amiodarone  200 mg Oral BID  . aspirin EC  81 mg Oral Daily  . atorvastatin  40 mg Oral q1800  . carvedilol  3.125 mg Oral BID WC  . digoxin  0.0625 mg Oral Daily  . ferrous sulfate  325 mg Oral Q breakfast  . furosemide  80 mg Intravenous Q8H  . insulin aspart  0-15 Units Subcutaneous TID WC  . insulin aspart protamine- aspart  15 Units Subcutaneous BID WC  . isosorbide-hydrALAZINE  1 tablet Oral BID  . mometasone-formoterol  2 puff Inhalation BID  . nicotine  14 mg Transdermal Daily  . pantoprazole  40 mg Oral Daily  . rivaroxaban  20 mg Oral QAC supper  .  sodium chloride flush  3 mL Intravenous Q12H  . spironolactone  25 mg Oral Daily   Continuous Infusions: . milrinone 0.25 mcg/kg/min (11/12/15 1400)     LOS: 3 days    Time spent: 25 minutes    Jacob Coder, MD [facility] Pager [phone]  If 7PM-7AM, please contact night-coverage [email or web address] Password [email or web address] 11/12/2015, 2:26 PM

## 2015-11-13 DIAGNOSIS — I481 Persistent atrial fibrillation: Secondary | ICD-10-CM

## 2015-11-13 LAB — GLUCOSE, CAPILLARY
GLUCOSE-CAPILLARY: 204 mg/dL — AB (ref 65–99)
GLUCOSE-CAPILLARY: 194 mg/dL — AB (ref 65–99)
GLUCOSE-CAPILLARY: 231 mg/dL — AB (ref 65–99)
GLUCOSE-CAPILLARY: 155 mg/dL — AB (ref 65–99)

## 2015-11-13 LAB — CBC WITH DIFFERENTIAL/PLATELET
BASOS ABS: 0 10*3/uL (ref 0.0–0.1)
Basophils Relative: 0 %
EOS PCT: 1 %
Eosinophils Absolute: 0.1 10*3/uL (ref 0.0–0.7)
HCT: 42 % (ref 39.0–52.0)
HEMOGLOBIN: 12.7 g/dL — AB (ref 13.0–17.0)
LYMPHS ABS: 1.5 10*3/uL (ref 0.7–4.0)
LYMPHS PCT: 16 %
MCH: 24.5 pg — ABNORMAL LOW (ref 26.0–34.0)
MCHC: 30.2 g/dL (ref 30.0–36.0)
MCV: 81.1 fL (ref 78.0–100.0)
Monocytes Absolute: 0.8 10*3/uL (ref 0.1–1.0)
Monocytes Relative: 8 %
NEUTROS PCT: 74 %
Neutro Abs: 7 10*3/uL (ref 1.7–7.7)
PLATELETS: 190 10*3/uL (ref 150–400)
RBC: 5.18 MIL/uL (ref 4.22–5.81)
RDW: 18.1 % — ABNORMAL HIGH (ref 11.5–15.5)
WBC: 9.2 10*3/uL (ref 4.0–10.5)

## 2015-11-13 LAB — BASIC METABOLIC PANEL
ANION GAP: 11 (ref 5–15)
BUN: 44 mg/dL — ABNORMAL HIGH (ref 6–20)
CHLORIDE: 87 mmol/L — AB (ref 101–111)
CO2: 33 mmol/L — ABNORMAL HIGH (ref 22–32)
Calcium: 9.3 mg/dL (ref 8.9–10.3)
Creatinine, Ser: 1.86 mg/dL — ABNORMAL HIGH (ref 0.61–1.24)
GFR calc Af Amer: 44 mL/min — ABNORMAL LOW (ref >60)
GFR, EST NON AFRICAN AMERICAN: 38 mL/min — AB (ref >60)
GLUCOSE: 191 mg/dL — AB (ref 65–99)
POTASSIUM: 4.4 mmol/L (ref 3.5–5.1)
SODIUM: 131 mmol/L — AB (ref 135–145)

## 2015-11-13 NOTE — Progress Notes (Signed)
Patient has Medicare /Medicad medical insurance with prescription drug coverage- CM unable to assist with any medication. Abelino Derrick [facility] [phone]     Coverage Information   Coverage information:    Subscriber: [id number] R Ulloa,Jorell    Rel to sub: 01 - Self    Member ID: [id number]    Payor: 246-MEDICAID [place]    Benefit plan: [id number]-MEDICAID [place] ACCESS Ph: [phone]    Group number: [id number]    Member effective dates: from 03/26/15

## 2015-11-13 NOTE — Clinical Social Work Note (Signed)
CSW acknowledges consult for inability to afford medications. Will notify RNCM.  CSW signing off. Consult again if any social work needs arise.  Charlynn Court, CSW [phone]

## 2015-11-13 NOTE — Progress Notes (Signed)
Advanced Heart Failure Team  Progress Note   Referring Physician: Dr. Melynda Ripple Primary Physician: [name], MD Primary Cardiologist:  Dr. Gala Romney   Reason for Consultation: A/C systolic HF  HPI:   Admitted with increased dyspnea and volume overload.  Milrinone added 11/10/15 with sluggish diuresis.    Feeling much better. Still 16 lbs up from last clinic visit. Wants to go home, but agrees to stay at least one more day.   Out 4.0 L and down 10 lbs over night. Creatinine stable. 1.6 -> 2.0 -> 1.86   Home Medications Prior to Admission medications   Medication Sig Start Date End Date Taking? Authorizing Provider  albuterol (PROVENTIL HFA;VENTOLIN HFA) 108 (90 Base) MCG/ACT inhaler Inhale 2 puffs into the lungs every 6 (six) hours as needed for wheezing or shortness of breath. 03/29/15   Jaclyn Shaggy, MD  albuterol (PROVENTIL) (2.5 MG/3ML) 0.083% nebulizer solution Take 3 mLs (2.5 mg total) by nebulization every 6 (six) hours as needed for wheezing or shortness of breath. 04/18/15   Jaclyn Shaggy, MD  amiodarone (PACERONE) 200 MG tablet Take 1 tablet (200 mg total) by mouth 2 (two) times daily. 09/18/15   Amy D Filbert Schilder, NP  atorvastatin (LIPITOR) 40 MG tablet Take 1 tablet (40 mg total) by mouth daily at 6 PM. 09/18/15   Amy D Clegg, NP  Blood Glucose Monitoring Suppl (ACCU-CHEK AVIVA) device Use as instructed 3 times daily before meals 04/18/15   Jaclyn Shaggy, MD  carvedilol (COREG) 3.125 MG tablet Take 1 tablet (3.125 mg total) by mouth 2 (two) times daily with a meal. 09/18/15   Amy D Clegg, NP  digoxin (LANOXIN) 0.125 MG tablet Take 0.5 tablets (0.0625 mg total) by mouth daily. 09/18/15   Amy D Clegg, NP  ferrous sulfate 325 (65 FE) MG EC tablet Take 325 mg by mouth daily with breakfast.     Historical Provider, MD  Fluticasone-Salmeterol (ADVAIR) 100-50 MCG/DOSE AEPB Inhale 1 puff into the lungs daily.    Historical Provider, MD  glipiZIDE (GLUCOTROL) 10 MG tablet Take 1 tablet (10 mg  total) by mouth 2 (two) times daily before a meal. 04/04/15   Jaclyn Shaggy, MD  isosorbide-hydrALAZINE (BIDIL) 20-37.5 MG tablet Take 1 tablet by mouth 2 (two) times daily. 09/18/15   Amy D Clegg, NP  pantoprazole (PROTONIX) 40 MG tablet Take 40 mg by mouth daily as needed.    Historical Provider, MD  rivaroxaban (XARELTO) 20 MG TABS tablet Take 1 tablet (20 mg total) by mouth daily before supper. 09/18/15   Amy D Filbert Schilder, NP  spironolactone (ALDACTONE) 25 MG tablet Take 1 tablet (25 mg total) by mouth daily. 09/18/15   Amy D Clegg, NP  torsemide (DEMADEX) 20 MG tablet Take 4 tablets (80 mg total) by mouth 2 (two) times daily. 09/18/15   Amy D Filbert Schilder, NP  traMADol (ULTRAM) 50 MG tablet Take 1 tablet (50 mg total) by mouth every 8 (eight) hours as needed for moderate pain. 01/30/15   Jaclyn Shaggy, MD    Past Medical History: Past Medical History:  Diagnosis Date  . Atrial fibrillation (HCC)    RVR 10/2014  . Automatic implantable cardioverter-defibrillator in situ   . CAD (coronary artery disease) Sept 2013   s/p cardiac cath showing occlusion of small RCA with collaterals  . CHF (congestive heart failure) (HCC)    20 to 25 % EF and RV dysfunction by 07/2014 echo   . Chronic anticoagulation    on xarelto.   Marland Kitchen  High cholesterol   . Hypertension   . Myocardial infarction (HCC) 2014  . Noncompliance    homelessness contributing.   . Peripheral arterial disease (HCC)   . Type II diabetes mellitus (HCC)     Past Surgical History: Past Surgical History:  Procedure Laterality Date  . CARDIAC CATHETERIZATION  09/2012  . CORONARY ANGIOPLASTY WITH STENT PLACEMENT  11/2011   "1"  . CORONARY ARTERY BYPASS GRAFT  09/2012   2 vessels per patient [name]([place])   . ESOPHAGOGASTRODUODENOSCOPY N/A 11/28/2014   Procedure: ESOPHAGOGASTRODUODENOSCOPY (EGD);  Surgeon: Beverley FiedlerJay M Pyrtle, MD;  [facility];  Service: Endoscopy;  Laterality: N/A;  . ILIAC ARTERY STENT Right 08/30/2013  . IMPLANTABLE CARDIOVERTER  DEFIBRILLATOR IMPLANT     [place] in 07/2012; [employer]  . LEFT AND RIGHT HEART CATHETERIZATION WITH CORONARY ANGIOGRAM N/A 12/02/2011   Procedure: LEFT AND RIGHT HEART CATHETERIZATION WITH CORONARY ANGIOGRAM;  Surgeon: Kathleene Hazelhristopher D McAlhany, MD;  [facility];  Service: Cardiovascular;  Laterality: N/A;  . LOWER EXTREMITY ANGIOGRAM N/A 08/30/2013   Procedure: LOWER EXTREMITY ANGIOGRAM;  Surgeon: Runell GessJonathan J Berry, MD;  [facility];  Service: Cardiovascular;  Laterality: N/A;  . LOWER EXTREMITY ANGIOGRAM N/A 12/02/2013   Procedure: LOWER EXTREMITY ANGIOGRAM;  Surgeon: Runell GessJonathan J Berry, MD;  [facility];  Service: Cardiovascular;  Laterality: N/A;    Family History: Family History  Problem Relation Age of Onset  . Diabetes Mother     Social History: Social History   Social History  . Marital status: Divorced    Spouse name: N/A  . Number of children: 3  . Years of education: N/A   Occupational History  . disabled    Social History Main Topics  . Smoking status: Current Every Day Smoker    Packs/day: 1.00    Years: 40.00    Types: Cigarettes  . Smokeless tobacco: Never Used  . Alcohol use No  . Drug use: No  . Sexual activity: No   Other Topics Concern  . None   Social History Narrative   Has an apartment with a roommate. He was living on the streets in October 2014.  He reports that his father died in [place] in October 2014.  He is divorced.  He is no longer estranged from his son, but still from his daughter who lives locally.  Neither of his parents, nor any siblings have any history of CAD.    Allergies:  Allergies  Allergen Reactions  . Tape Itching    Paper tape please.    Objective:    Vital Signs:   Temp:  [97.4 F (36.3 C)-98.4 F (36.9 C)] 97.7 F (36.5 C) (08/21 0445) Pulse Rate:  [61-124] 124 (08/21 0445) Resp:  [18-20] 20 (08/21 0445) BP: (100-115)/(56-67) 100/56 (08/21 0445) SpO2:  [92 %-97 %] 92 % (08/21  0445) Weight:  [217 lb 8 oz (98.7 kg)] 217 lb 8 oz (98.7 kg) (08/21 0445) Last BM Date: 11/11/15  Weight change: Filed Weights   11/11/15 0514 11/12/15 0551 11/13/15 0445  Weight: 232 lb 11.2 oz (105.6 kg) 227 lb 3.2 oz (103.1 kg) 217 lb 8 oz (98.7 kg)    Intake/Output:   Intake/Output Summary (Last 24 hours) at 11/13/15 0735 Last data filed at 11/13/15 0100  Gross per 24 hour  Intake           1467.2 ml  Output             5150 ml  Net          -3682.8 ml     Physical Exam: General:  Appears fatigued. No resp difficulty. In the chair HEENT: normal Neck: supple. JVP 12-13 cm. Carotids 2+ bilat; no bruits. No thyromegaly or nodule noted. Cor: PMI nondisplaced. Irregular rate & rhythm, tachy. No M/G/R appreciated.  Lungs: Diminished bibasilar sounds.  Abdomen: soft, NT, ND, no HSM. No bruits or masses. +BS  Extremities: no cyanosis, clubbing, rash. R and LLE 1+ edema Neuro: alert & orientedx3, cranial nerves grossly intact. moves all 4 extremities w/o difficulty. Affect pleasant  Telemetry: Reviewed personally, Afib 120s this am.   Labs: Basic Metabolic Panel:  Recent Labs Lab 11/09/15 0248 11/10/15 0317 11/11/15 0238 11/12/15 0200 11/13/15 0421  NA 132* 135 132* 131* 131*  K 3.6 4.1 4.8 5.0 4.4  CL 93* 96* 90* 87* 87*  CO2 29 32 32 32 33*  GLUCOSE 355* 141* 242* 254* 191*  BUN 24* 27* 30* 38* 44*  CREATININE 1.55* 1.64* 1.63* 1.99* 1.86*  CALCIUM 8.2* 8.4* 8.7* 8.9 9.3    Liver Function Tests:  Recent Labs Lab 11/10/15 0317  AST 17  ALT 15*  ALKPHOS 149*  BILITOT 1.6*  PROT 6.8  ALBUMIN 3.0*   No results for input(s): LIPASE, AMYLASE in the last 168 hours. No results for input(s): AMMONIA in the last 168 hours.  CBC:  Recent Labs Lab 11/08/15 1450 11/10/15 0317 11/11/15 0238 11/12/15 0200 11/13/15 0421  WBC 7.3 8.3 8.6 11.1* 9.2  NEUTROABS  --  6.3 6.8 8.8* 7.0  HGB 13.4 12.8* 12.8* 12.3* 12.7*  HCT 43.9 44.0 43.5 41.0 42.0  MCV 82.1 83.2  83.0 82.7 81.1  PLT 150 213 185 209 190    Cardiac Enzymes: No results for input(s): CKTOTAL, CKMB, CKMBINDEX, TROPONINI in the last 168 hours.  BNP: BNP (last 3 results)  Recent Labs  05/17/15 1559 08/15/15 1447 11/08/15 1450  BNP 777.8* 546.6* 748.2*    ProBNP (last 3 results)  Recent Labs  12/12/14 1614  PROBNP 1,892.00*     CBG:  Recent Labs Lab 11/12/15 0701 11/12/15 1142 11/12/15 1627 11/12/15 2210 11/13/15 0655  GLUCAP 214* 158* 180* 169* 204*    Coagulation Studies: No results for input(s): LABPROT, INR in the last 72 hours.  Other results: EKG: Afib   Imaging: No results found.   Medications:     Current Medications: . amiodarone  200 mg Oral BID  . aspirin EC  81 mg Oral Daily  . atorvastatin  40 mg Oral q1800  . carvedilol  3.125 mg Oral BID WC  . digoxin  0.0625 mg Oral Daily  . ferrous sulfate  325 mg Oral Q breakfast  . furosemide  80 mg Intravenous Q8H  . insulin aspart  0-15 Units Subcutaneous TID WC  . insulin aspart protamine- aspart  15 Units Subcutaneous BID WC  . isosorbide-hydrALAZINE  1 tablet Oral BID  . mometasone-formoterol  2 puff Inhalation BID  . nicotine  14 mg Transdermal Daily  . pantoprazole  40 mg Oral Daily  . rivaroxaban  20 mg Oral QAC supper  . sodium chloride flush  3 mL Intravenous Q12H  . spironolactone  25 mg Oral Daily    Infusions: . milrinone 0.25 mcg/kg/min (11/12/15 2354)     Assessment/Plan   Emre Stock is a 60 y.o. male with systolic CHF, Chronic Afib, DMII, H/o GI pleed, PAD, and history of non-compliance who presents with A/C systolic CHF in  setting of medical non-compliance x 2 weeks.   1. Acute on chronic Systolic Heart Failure - ICM EF 20-25%. Has [employer] ICD.  NYHA IIIb symptoms in setting of medication non-compliance for at least 2 weeks.   - Great diuresis now on milrinone 0.25 mcg. Continue lasix 80 mg TID at least one more day.   - Creatinine up yesterday, but  stabilized today.  BUN trending up slightly. Continue to follow closely.   - Continue spiro 25 mg  - Continue dig 0.0625 mg.  - Tolerating 3.125 mg BID of coreg.  - Continue bidil 1 tab tid.  2. Chronic A fib  - Continue Amio 200 mg BID and Xarelto. No bleeding problems. TSH ok.  - 3. DMII- Per PCP  4. H/O GI Bleed - duodenal ulcer with clip 9/5 - Hgb stable back on Xarelto. Continue to follow CBCs.  5. Smoking- Current smoker. Counseled on smoking cessation.  6. CAD: CABG in 2014 at [place], no chest pain.  Continue atorvastatin.  - No ASA with stable disease on Xarelto  7. AKI on CKD: Stage III -  Up slightly yesterday, and balanced out today.   Pt is NOT a candidate for advanced therapies with social situation and non compliance.  Social- Followed by Paramedicine in the community.   Improved but remains Volume overloaded. Continue IV diuretics for at least one more day. Rather insistent on going home tomorrow, but likely needs at least 2 more days. Will see how he diureses today.   Length of Stay: 4  Graciella Freer PA-C  11/13/2015, 7:35 AM   Advanced Heart Failure Team Pager [phone] (M-F; 7a - 4p)  Please contact [facility] for night-coverage after hours (4p -7a ) and weekends on [email or web address]    Patient seen and examined with Otilio Saber, PA-C. We discussed all aspects of the encounter. I agree with the assessment and plan as stated above.   He is improving with diuresis and IV milrinone. Still with some volume overload. He is very anxious to go home. Will continue IV lasix today. Cut milrinone to 0.125 today with plan to stop tomorrow. Possibly home Wednesday if everything falls into place. Continue Xarelto fo AF. Renal function stable.   Immanuel Fedak,MD 10:53 PM

## 2015-11-13 NOTE — Progress Notes (Signed)
[facility]                                                                              Patient Demographics  Jacob [name], is a 60 y.o. male, DOB - 1955-04-29, DHR:[id number]  Admit date - 11/08/2015   Admitting Physician Jonah Blue, MD  Outpatient Primary MD for the patient is [name], MD  Outpatient specialists:   LOS - 4  days    Chief Complaint  Patient presents with  . Shortness of Breath       Brief summary   Jacob [name] is a 60yo man who presented with acute exacerbation of HFrEF.  He has medical compliance issues due to cost and has not been taking his medications apparently.  He came in with volume overload, edema, elevated JVP.  Heart failure team is also following and has him on a milrinone drip.     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   - Cardiology following, negative balance of 11.5 L, weight down from 235lbs -> 217lbs  - Continue spironolactone, digoxin - AICD in place.  Cardiology recommending continuing IV diuretics for at least 1 more day, patient insistent on going home tomorrow.-    AKI (acute kidney injury) -  creatinine improving to 1.8 today  Essential hypertension - BP well controlled  - Continue diuresis, coreg, isosorbide-hydralazine  AF (atrial fibrillation)  - HR slightly high, 90s-100s - Continue coreg, Continue dig/amiodarone - Continue xarelto    Tobacco abuse - Counsel on cessation     DM (diabetes mellitus), type 2 with peripheral vascular complications -  continue insulin, CBGs improving, - On glipizide at home - SSI while here    Coronary artery disease - See above, he is on adequate therapy.  - Aspirin,  atorvastatin  Anemia -  H&H stable  Code Status:  Full code  DVT Prophylaxis:  xarelto Family Communication: Discussed in detail with the patient, all imaging results, lab results explained to the patient   Disposition Plan: Likely DC next 24-48 hours  Time Spent in minutes  25 minutes  Procedures:  2-D echo   Consultants:   Cardiology   Antimicrobials:      Medications  Scheduled Meds: . amiodarone  200 mg Oral BID  . aspirin EC  81 mg Oral Daily  . atorvastatin  40 mg Oral q1800  . carvedilol  3.125 mg Oral BID WC  . digoxin  0.0625 mg Oral Daily  . ferrous sulfate  325 mg Oral Q breakfast  . furosemide  80 mg Intravenous Q8H  . insulin aspart  0-15 Units Subcutaneous TID WC  . insulin aspart protamine- aspart  15 Units Subcutaneous BID WC  . isosorbide-hydrALAZINE  1 tablet Oral BID  . mometasone-formoterol  2 puff Inhalation BID  . nicotine  14 mg Transdermal Daily  . pantoprazole  40 mg Oral  Daily  . rivaroxaban  20 mg Oral QAC supper  . sodium chloride flush  3 mL Intravenous Q12H  . spironolactone  25 mg Oral Daily   Continuous Infusions: . milrinone 0.25 mcg/kg/min (11/12/15 2354)   PRN Meds:.sodium chloride, acetaminophen, albuterol, metoprolol, ondansetron (ZOFRAN) IV, sodium chloride flush, traMADol, zolpidem   Antibiotics   Anti-infectives    None        Subjective:   Jacob [name] was seen and examined today. Sitting up in the chair, on milrinone drip, feeling better. Patient denies dizziness, chest pain, shortness of breath, abdominal pain, N/V/D/C, new weakness, numbess, tingling. No acute events overnight.    Objective:   Vitals:   11/12/15 2300 11/13/15 0445 11/13/15 1000 11/13/15 1027  BP: 115/67 (!) 100/56 93/60 104/60  Pulse: (!) 120 (!) 124 98   Resp:  20 (!) 28   Temp:  97.7 F (36.5 C) 98 F (36.7 C)   TempSrc:  Oral Oral   SpO2:  92%    Weight:  98.7 kg (217 lb 8 oz)    Height:        Intake/Output Summary (Last 24  hours) at 11/13/15 1148 Last data filed at 11/13/15 0901  Gross per 24 hour  Intake             1448 ml  Output             5025 ml  Net            -3577 ml     Wt Readings from Last 3 Encounters:  11/13/15 98.7 kg (217 lb 8 oz)  09/13/15 95.7 kg (211 lb)  08/31/15 91.4 kg (201 lb 6.4 oz)     Exam  General: Alert and oriented x 3, NAD  HEENT:    Neck: Supple, + JVD, no masses  Cardiovascular: S1 S2 auscultated, no rubs, murmurs or gallops. Regular rate and rhythm.  RespiratoryDecreased breath sounds at the bases  Abdomen : Soft, nontender, nondistended, + bowel sounds  Ext: no cyanosis clubbing. 1+ edema  Neuro: AAOx3, Cr N's II- XII. Strength 5/5 upper and lower extremities bilaterally  Skin: No rashes  Psych: Normal affect and demeanor, alert and oriented x3    Data Reviewed:  I have personally reviewed following labs and imaging studies  Micro Results No results found for this or any previous visit (from the past 240 hour(s)).  Radiology Reports Dg Chest 2 View  Result Date: 11/08/2015 CLINICAL DATA:  Shortness of breath abdominal fluid retention. EXAM: CHEST  2 VIEW COMPARISON:  Two-view chest x-ray 09/13/2015. FINDINGS: Cardiomegaly is stable. Mild edema is present. Right greater left pleural effusions and airspace disease are present. Pacing/defibrillator wire is stable. IMPRESSION: 1. Congestive heart failure. 2. Right greater than left basilar airspace disease. While this likely reflects atelectasis, infection or aspiration is also considered. Electronically Signed   By: Marin Robertshristopher  Mattern M.D.   On: 11/08/2015 16:13   [place] Abdomen Complete  Result Date: 11/09/2015 CLINICAL DATA:  Previous history of mild ascites EXAM: ABDOMEN ULTRASOUND COMPLETE COMPARISON:  05/14/2013 FINDINGS: Gallbladder: Gallbladder wall thickening is again noted without evidence of cholelithiasis. No significant pericholecystic fluid is noted. The wall thickening has improved slightly  from the prior exam. Gallbladder sludge is noted. Common bile duct: Diameter: 3.3 mm. Liver: Mildly increased in echogenicity which may be related to fatty infiltration. No significant nodularity is noted. No mass lesion is seen. IVC: No abnormality visualized. Pancreas: Visualized portion unremarkable. Spleen: Size and  appearance within normal limits. Right Kidney: Length: 10.6 cm. 3.2 cm cyst is noted in the upper pole stable from the prior exam. Left Kidney: Length: 9.4 cm. Some limited views likely decrease the size of the left kidney. Echogenicity within normal limits. No mass or hydronephrosis visualized. Abdominal aorta: No aneurysm visualized. Other findings: Small bilateral pleural effusions are noted. No significant ascites is seen. IMPRESSION: Persistent but mildly improved gallbladder wall thickening. Stable right renal cyst. Small bilateral pleural effusions are noted. No significant ascites is seen. Electronically Signed   By: Alcide Clever M.D.   On: 11/09/2015 07:35    Lab Data:  CBC:  Recent Labs Lab 11/08/15 1450 11/10/15 0317 11/11/15 0238 11/12/15 0200 11/13/15 0421  WBC 7.3 8.3 8.6 11.1* 9.2  NEUTROABS  --  6.3 6.8 8.8* 7.0  HGB 13.4 12.8* 12.8* 12.3* 12.7*  HCT 43.9 44.0 43.5 41.0 42.0  MCV 82.1 83.2 83.0 82.7 81.1  PLT 150 213 185 209 190   Basic Metabolic Panel:  Recent Labs Lab 11/09/15 0248 11/10/15 0317 11/11/15 0238 11/12/15 0200 11/13/15 0421  NA 132* 135 132* 131* 131*  K 3.6 4.1 4.8 5.0 4.4  CL 93* 96* 90* 87* 87*  CO2 29 32 32 32 33*  GLUCOSE 355* 141* 242* 254* 191*  BUN 24* 27* 30* 38* 44*  CREATININE 1.55* 1.64* 1.63* 1.99* 1.86*  CALCIUM 8.2* 8.4* 8.7* 8.9 9.3   GFR: Estimated Creatinine Clearance: 45.1 mL/min (by C-G formula based on SCr of 1.86 mg/dL). Liver Function Tests:  Recent Labs Lab 11/10/15 0317  AST 17  ALT 15*  ALKPHOS 149*  BILITOT 1.6*  PROT 6.8  ALBUMIN 3.0*   No results for input(s): LIPASE, AMYLASE in the last  168 hours. No results for input(s): AMMONIA in the last 168 hours. Coagulation Profile: No results for input(s): INR, PROTIME in the last 168 hours. Cardiac Enzymes: No results for input(s): CKTOTAL, CKMB, CKMBINDEX, TROPONINI in the last 168 hours. BNP (last 3 results)  Recent Labs  12/12/14 1614  PROBNP 1,892.00*   HbA1C: No results for input(s): HGBA1C in the last 72 hours. CBG:  Recent Labs Lab 11/12/15 1142 11/12/15 1627 11/12/15 2210 11/13/15 0655 11/13/15 1109  GLUCAP 158* 180* 169* 204* 194*   Lipid Profile: No results for input(s): CHOL, HDL, LDLCALC, TRIG, CHOLHDL, LDLDIRECT in the last 72 hours. Thyroid Function Tests: No results for input(s): TSH, T4TOTAL, FREET4, T3FREE, THYROIDAB in the last 72 hours. Anemia Panel: No results for input(s): VITAMINB12, FOLATE, FERRITIN, TIBC, IRON, RETICCTPCT in the last 72 hours. Urine analysis:    Component Value Date/Time   COLORURINE YELLOW 12/01/2014 1300   APPEARANCEUR CLEAR 12/01/2014 1300   LABSPEC 1.009 12/01/2014 1300   PHURINE 5.5 12/01/2014 1300   GLUCOSEU NEGATIVE 12/01/2014 1300   HGBUR NEGATIVE 12/01/2014 1300   BILIRUBINUR NEGATIVE 12/01/2014 1300   KETONESUR NEGATIVE 12/01/2014 1300   PROTEINUR NEGATIVE 12/01/2014 1300   UROBILINOGEN 1.0 12/01/2014 1300   NITRITE NEGATIVE 12/01/2014 1300   LEUKOCYTESUR NEGATIVE 12/01/2014 1300     Jacob [name] M.D. [facility] 11/13/2015, 11:48 AM  Pager: [phone] Between 7am to 7pm - call Pager - [phone]  After 7pm go to [email or web address] - password [email or web address]  Call night coverage person covering after 7pm

## 2015-11-13 NOTE — Progress Notes (Signed)
Inpatient Diabetes Program Recommendations  AACE/ADA: New Consensus Statement on Inpatient Glycemic Control (2015)  Target Ranges:  Prepandial:   less than 140 mg/dL      Peak postprandial:   less than 180 mg/dL (1-2 hours)      Critically ill patients:  140 - 180 mg/dL   Results for Jacob [name], Jacob [name] (MRN [id number]) as of 11/13/2015 09:45  Ref. Range 11/12/2015 07:01 11/12/2015 11:42 11/12/2015 16:27 11/12/2015 22:10  Glucose-Capillary Latest Ref Range: 65 - 99 mg/dL 567 (H) 014 (H) 103 (H) 169 (H)   Results for Jacob [name], Jacob [name] (MRN [id number]) as of 11/13/2015 09:45  Ref. Range 11/13/2015 06:55  Glucose-Capillary Latest Ref Range: 65 - 99 mg/dL 757 (H)    Home DM Meds: Glipizide 10 mg bid  Current Insulin Orders: 70/30 Insulin: 15 units bidwc      Novolog Moderate Correction Scale/ SSI (0-15 units) TID AC     -Note patient started on 70/30 insulin on the evening of 08/19.  -Glucose levels much better controlled since insulin started.  -Still having elevated fasting glucose levels.     MD- Please consider increasing PM dose of 70/30 insulin slightly to 18 units with supper (20% increase)  Would keep AM dose of 70/30 insulin at 15 units with breakfast      --Will follow patient during hospitalization--  Ambrose Finland RN, MSN, CDE Diabetes Coordinator Inpatient Glycemic Control Team Team Pager: [phone] (8a-5p)

## 2015-11-14 DIAGNOSIS — J9601 Acute respiratory failure with hypoxia: Secondary | ICD-10-CM

## 2015-11-14 DIAGNOSIS — R188 Other ascites: Secondary | ICD-10-CM

## 2015-11-14 LAB — BASIC METABOLIC PANEL
ANION GAP: 10 (ref 5–15)
BUN: 53 mg/dL — AB (ref 6–20)
CALCIUM: 9.2 mg/dL (ref 8.9–10.3)
CO2: 33 mmol/L — AB (ref 22–32)
Chloride: 86 mmol/L — ABNORMAL LOW (ref 101–111)
Creatinine, Ser: 1.92 mg/dL — ABNORMAL HIGH (ref 0.61–1.24)
GFR calc Af Amer: 43 mL/min — ABNORMAL LOW (ref >60)
GFR calc non Af Amer: 37 mL/min — ABNORMAL LOW (ref >60)
GLUCOSE: 168 mg/dL — AB (ref 65–99)
POTASSIUM: 4.5 mmol/L (ref 3.5–5.1)
Sodium: 129 mmol/L — ABNORMAL LOW (ref 135–145)

## 2015-11-14 LAB — GLUCOSE, CAPILLARY
Glucose-Capillary: 212 mg/dL — ABNORMAL HIGH (ref 65–99)
Glucose-Capillary: 203 mg/dL — ABNORMAL HIGH (ref 65–99)

## 2015-11-14 LAB — CBC WITH DIFFERENTIAL/PLATELET
BASOS ABS: 0.1 10*3/uL (ref 0.0–0.1)
Basophils Relative: 1 %
EOS PCT: 1 %
Eosinophils Absolute: 0.1 10*3/uL (ref 0.0–0.7)
HEMATOCRIT: 42.8 % (ref 39.0–52.0)
Hemoglobin: 12.9 g/dL — ABNORMAL LOW (ref 13.0–17.0)
LYMPHS ABS: 1.5 10*3/uL (ref 0.7–4.0)
LYMPHS PCT: 15 %
MCH: 24.2 pg — AB (ref 26.0–34.0)
MCHC: 30.1 g/dL (ref 30.0–36.0)
MCV: 80.3 fL (ref 78.0–100.0)
MONO ABS: 0.9 10*3/uL (ref 0.1–1.0)
Monocytes Relative: 9 %
NEUTROS ABS: 7.6 10*3/uL (ref 1.7–7.7)
Neutrophils Relative %: 74 %
PLATELETS: 231 10*3/uL (ref 150–400)
RBC: 5.33 MIL/uL (ref 4.22–5.81)
RDW: 18.1 % — AB (ref 11.5–15.5)
WBC: 10.2 10*3/uL (ref 4.0–10.5)

## 2015-11-14 MED ORDER — TORSEMIDE 20 MG PO TABS: 80.0000 mg | ORAL_TABLET | Freq: Two times a day (BID) | ORAL | Status: DC

## 2015-11-14 MED ORDER — TORSEMIDE 20 MG PO TABS: 80.0000 mg | ORAL_TABLET | Freq: Two times a day (BID) | ORAL | 3 refills | Status: DC

## 2015-11-14 MED FILL — ATORVASTATIN 40 MG TABLET: 40 | 30 days supply | Qty: 30 | Fill #0

## 2015-11-14 MED FILL — TORSEMIDE 20 MG TABLET: 20 | 30 days supply | Qty: 240 | Fill #0

## 2015-11-14 MED FILL — CARVEDILOL 3.125 MG TABLET: 3.125 | 30 days supply | Qty: 60 | Fill #0

## 2015-11-14 MED FILL — XARELTO 20 MG TABLET: 20 | 30 days supply | Qty: 30 | Fill #0

## 2015-11-14 MED FILL — glipiZIDE 10 MG TABS: 10 | 30 days supply | Qty: 60 | Fill #1

## 2015-11-14 MED FILL — DIGITEK 125 MCG TABLET: 125 | 30 days supply | Qty: 15 | Fill #0

## 2015-11-14 MED FILL — SPIRONOLACTONE 25 MG TABLET: 25 | 30 days supply | Qty: 30 | Fill #0

## 2015-11-14 MED FILL — AMIODARONE HCL 200 MG TAB: 200 | 30 days supply | Qty: 60 | Fill #0

## 2015-11-14 NOTE — Discharge Summary (Signed)
Physician Discharge Summary   Patient ID: Jacob [name] MRN: [id number] DOB/AGE: 1955-12-29 60 y.o.  Admit date: 11/08/2015 Discharge date: 11/14/2015  Primary Care Physician:  [name], MD  Discharge Diagnoses:    . Acute on chronic combined systolic and diastolic CHF (congestive heart failure) (HCC) . Acute respiratory failure (HCC) . AF (atrial fibrillation) (HCC) . Coronary artery disease involving native coronary artery of native heart without angina pectoris . DM (diabetes mellitus), type 2 with peripheral vascular complications (HCC) . Essential hypertension . Tobacco abuse . Ascites . AKI (acute kidney injury) (HCC)   Consults:Cardiology, Dr. Gala Romney  Recommendations for Outpatient Follow-up:  1. Patient was placed on torsemide 80 mg twice a day, was previously taking 60 mg twice a day at home 2. Please repeat CBC/BMET at next visit   DIET: Low-sodium, carb modified    Allergies:   Allergies  Allergen Reactions  . Tape Itching    Paper tape please.     DISCHARGE MEDICATIONS: Current Discharge Medication List    CONTINUE these medications which have CHANGED   Details  torsemide (DEMADEX) 20 MG tablet Take 4 tablets (80 mg total) by mouth 2 (two) times daily. Qty: 240 tablet, Refills: 3   Associated Diagnoses: Cardiomyopathy, ischemic      CONTINUE these medications which have NOT CHANGED   Details  albuterol (PROVENTIL HFA;VENTOLIN HFA) 108 (90 Base) MCG/ACT inhaler Inhale 2 puffs into the lungs every 6 (six) hours as needed for wheezing or shortness of breath. Qty: 1 Inhaler, Refills: 2   Associated Diagnoses: Dyspnea    albuterol (PROVENTIL) (2.5 MG/3ML) 0.083% nebulizer solution Take 3 mLs (2.5 mg total) by nebulization every 6 (six) hours as needed for wheezing or shortness of breath. Qty: 150 mL, Refills: 1    amiodarone (PACERONE) 200 MG tablet Take 1 tablet (200 mg total) by mouth 2 (two) times daily. Qty: 60 tablet, Refills: 3     atorvastatin (LIPITOR) 40 MG tablet Take 1 tablet (40 mg total) by mouth daily at 6 PM. Qty: 30 tablet, Refills: 2   Associated Diagnoses: Coronary artery disease involving native artery of transplanted heart without angina pectoris    carvedilol (COREG) 3.125 MG tablet Take 1 tablet (3.125 mg total) by mouth 2 (two) times daily with a meal. Qty: 60 tablet, Refills: 2   Associated Diagnoses: Chronic combined systolic and diastolic heart failure (HCC)    digoxin (LANOXIN) 0.125 MG tablet Take 0.5 tablets (0.0625 mg total) by mouth daily. Qty: 15 tablet, Refills: 5    ferrous sulfate 325 (65 FE) MG EC tablet Take 325 mg by mouth 3 (three) times daily with meals.     Fluticasone-Salmeterol (ADVAIR) 100-50 MCG/DOSE AEPB Inhale 1 puff into the lungs daily.    glipiZIDE (GLUCOTROL) 10 MG tablet Take 1 tablet (10 mg total) by mouth 2 (two) times daily before a meal. Qty: 60 tablet, Refills: 2   Associated Diagnoses: Type 2 diabetes mellitus with other circulatory complication, without long-term current use of insulin (HCC)    isosorbide-hydrALAZINE (BIDIL) 20-37.5 MG tablet Take 1 tablet by mouth 2 (two) times daily. Qty: 60 tablet, Refills: 5    pantoprazole (PROTONIX) 40 MG tablet Take 40 mg by mouth daily.     rivaroxaban (XARELTO) 20 MG TABS tablet Take 1 tablet (20 mg total) by mouth daily before supper. Qty: 30 tablet, Refills: 5   Associated Diagnoses: Chronic atrial fibrillation (HCC)    spironolactone (ALDACTONE) 25 MG tablet Take 1 tablet (25 mg total)  by mouth daily. Qty: 30 tablet, Refills: 5   Associated Diagnoses: Chronic combined systolic and diastolic heart failure (HCC)    traMADol (ULTRAM) 50 MG tablet Take 1 tablet (50 mg total) by mouth every 8 (eight) hours as needed for moderate pain. Qty: 60 tablet, Refills: 1    Blood Glucose Monitoring Suppl (ACCU-CHEK AVIVA) device Use as instructed 3 times daily before meals Qty: 1 each, Refills: 0   Associated Diagnoses:  Type 2 diabetes mellitus with other circulatory complication, without long-term current use of insulin (HCC)         Brief H and P: For complete details please refer to admission H and P, but in brief Jacob [name] is a 60yo man who presented with acute exacerbation of HFrEF. He has medical compliance issues due to cost and has not been taking his medications apparently. He came in with volume overload, edema, elevated JVP.  Hospital Course:   Acute on chronic combined systolic and diastolic CHF (HFrEF) - Slowly improving, Patient was placed on IV milrinone and aggressive IV diuresis. - TTE 9/16 with EF of 30-35%, repeat TTE with EF 25 - 30% - Per cardiology, continue amiodarone, lipitor, coreg, digoxin, xarelto.  - Milrinone was turned off on 8/22, patient was transitioned to oral torsemide 80 mg twice a day. - Negative balance of 14.28 L at discharge. weight down from 235lbs -> 212 lbs at discharge - Continue spironolactone, digoxin - AICD in place.    AKI (acute kidney injury) -  creatinine stable at 1.9, patient is not transitioned to oral torsemide  Essential hypertension - BP well controlled  - Continue diuresis, coreg, isosorbide-hydralazine  AF (atrial fibrillation)  - HR slightly high, 90s-100s - Continue coreg, Continue digoxin and amiodarone - Continue xarelto  Tobacco abuse - Counsel on cessation   DM (diabetes mellitus), type 2 with peripheral vascular complications Patient was continued on insulin while inpatient, on glipizide at home   Coronary artery disease - See above, he is on adequate therapy.  - Aspirin, atorvastatin  Anemia -  H&H stable  Day of Discharge BP 119/62 (BP Location: Left Arm)   Pulse (!) 116   Temp 97.5 F (36.4 C) (Oral)   Resp 18   Ht 5\' 3"  (1.6 m)   Wt 96.2 kg (212 lb 1.6 oz) Comment: scale a  SpO2 95%   BMI 37.57 kg/m   Physical Exam: General: Alert and awake oriented x3 not in any acute distress. HEENT:  anicteric sclera, pupils reactive to light and accommodation CVS: S1-S2 clear , irregularly irregular Chest: clear to auscultation bilaterally, no wheezing rales or rhonchi Abdomen: soft nontender, nondistended, normal bowel sounds Extremities: no cyanosis, clubbing, trace edema noted bilaterally Neuro: Cranial nerves II-XII intact, no focal neurological deficits   The results of significant diagnostics from this hospitalization (including imaging, microbiology, ancillary and laboratory) are listed below for reference.    LAB RESULTS: Basic Metabolic Panel:  Recent Labs Lab 11/13/15 0421 11/14/15 0340  NA 131* 129*  K 4.4 4.5  CL 87* 86*  CO2 33* 33*  GLUCOSE 191* 168*  BUN 44* 53*  CREATININE 1.86* 1.92*  CALCIUM 9.3 9.2   Liver Function Tests:  Recent Labs Lab 11/10/15 0317  AST 17  ALT 15*  ALKPHOS 149*  BILITOT 1.6*  PROT 6.8  ALBUMIN 3.0*   No results for input(s): LIPASE, AMYLASE in the last 168 hours. No results for input(s): AMMONIA in the last 168 hours. CBC:  Recent Labs Lab  11/13/15 0421 11/14/15 0340  WBC 9.2 10.2  NEUTROABS 7.0 7.6  HGB 12.7* 12.9*  HCT 42.0 42.8  MCV 81.1 80.3  PLT 190 231   Cardiac Enzymes: No results for input(s): CKTOTAL, CKMB, CKMBINDEX, TROPONINI in the last 168 hours. BNP: Invalid input(s): POCBNP CBG:  Recent Labs Lab 11/14/15 0617 11/14/15 1138  GLUCAP 212* 203*    Significant Diagnostic Studies:  Dg Chest 2 View  Result Date: 11/08/2015 CLINICAL DATA:  Shortness of breath abdominal fluid retention. EXAM: CHEST  2 VIEW COMPARISON:  Two-view chest x-ray 09/13/2015. FINDINGS: Cardiomegaly is stable. Mild edema is present. Right greater left pleural effusions and airspace disease are present. Pacing/defibrillator wire is stable. IMPRESSION: 1. Congestive heart failure. 2. Right greater than left basilar airspace disease. While this likely reflects atelectasis, infection or aspiration is also considered.  Electronically Signed   By: Marin Roberts M.D.   On: 11/08/2015 16:13   US Abdomen Complete  Result Date: 11/09/2015 CLINICAL DATA:  Previous history of mild ascites EXAM: ABDOMEN ULTRASOUND COMPLETE COMPARISON:  05/14/2013 FINDINGS: Gallbladder: Gallbladder wall thickening is again noted without evidence of cholelithiasis. No significant pericholecystic fluid is noted. The wall thickening has improved slightly from the prior exam. Gallbladder sludge is noted. Common bile duct: Diameter: 3.3 mm. Liver: Mildly increased in echogenicity which may be related to fatty infiltration. No significant nodularity is noted. No mass lesion is seen. IVC: No abnormality visualized. Pancreas: Visualized portion unremarkable. Spleen: Size and appearance within normal limits. Right Kidney: Length: 10.6 cm. 3.2 cm cyst is noted in the upper pole stable from the prior exam. Left Kidney: Length: 9.4 cm. Some limited views likely decrease the size of the left kidney. Echogenicity within normal limits. No mass or hydronephrosis visualized. Abdominal aorta: No aneurysm visualized. Other findings: Small bilateral pleural effusions are noted. No significant ascites is seen. IMPRESSION: Persistent but mildly improved gallbladder wall thickening. Stable right renal cyst. Small bilateral pleural effusions are noted. No significant ascites is seen. Electronically Signed   By: Alcide Clever M.D.   On: 11/09/2015 07:35    2D ECHO: Study Conclusions  - Left ventricle: The cavity size was moderately dilated. Wall   thickness was normal. Systolic function was severely reduced. The   estimated ejection fraction was in the range of 25% to 30%.   Diffuse hypokinesis. - Ventricular septum: Septal motion showed paradox. These changes   are consistent with a left bundle branch block. - Aortic valve: There was trivial regurgitation. - Left atrium: The atrium was severely dilated. - Right ventricle: Systolic function was moderately  reduced. - Right atrium: The atrium was mildly dilated. - Pulmonary arteries: Systolic pressure was mildly increased. PA   peak pressure: 38 mm Hg (S). - Pericardium, extracardiac: There was a right pleural effusion.   There was a left pleural effusion.  Disposition and Follow-up: Discharge Instructions    (HEART FAILURE PATIENTS) Call MD:  Anytime you have any of the following symptoms: 1) 3 pound weight gain in 24 hours or 5 pounds in 1 week 2) shortness of breath, with or without a dry hacking cough 3) swelling in the hands, feet or stomach 4) if you have to sleep on extra pillows at night in order to breathe.    Complete by:  As directed   Amb Referral to HF Clinic    Complete by:  As directed   Diet - low sodium heart healthy    Complete by:  As directed   Increase  activity slowly    Complete by:  As directed       DISPOSITION: home    DISCHARGE FOLLOW-UP Follow-up Information    [facility] Follow up on 11/21/2015.   Why:  Transitional [facility] Clinic appointment on 11/21/15 at 2:15 pm with Dr. Venetia Night. Contact information: [address] [place] [address] [phone]       [facility] Follow up on 11/22/2015.   Specialty:  Cardiology Why:  at 0940 for post hospital follow up. Please bring all of your medications to your visit. The code for parking is 0003. Contact information: [address] [id number] mc [place] [address] [phone]           Time spent on Discharge:   Signed:   RAI,RIPUDEEP M.D. [facility] 11/14/2015, 1:14 PM Pager: [phone]

## 2015-11-14 NOTE — Progress Notes (Signed)
Pt has orders to be discharged. Discharge instructions given and pt has no additional questions at this time. Medication regimen reviewed and pt educated. Pt verbalized understanding and has no additional questions. Telemetry box removed. IV removed and site in good condition. Pt stable and waiting for transportation.   Blanka Rockholt RN 

## 2015-11-14 NOTE — Progress Notes (Signed)
Advanced Heart Failure Team  Progress Note   Referring Physician: Dr. Melynda Ripple Primary Physician: [name], MD Primary Cardiologist:  Dr. Gala Romney   Reason for Consultation: A/C systolic HF  HPI:   Admitted with increased dyspnea and volume overload.  Milrinone added 11/10/15 with sluggish diuresis.    Continues to improve from a volume standpoint.  Adamant that he must go home today.  Denies SOB, CP, lightheadedness or dizziness.   Out 2.1 L and down another 5 lbs over night.    Creatinine elevated, but relatively stable. 1.6 -> 2.0 -> 1.86 -> 1.92   Home Medications Prior to Admission medications   Medication Sig Start Date End Date Taking? Authorizing Provider  albuterol (PROVENTIL HFA;VENTOLIN HFA) 108 (90 Base) MCG/ACT inhaler Inhale 2 puffs into the lungs every 6 (six) hours as needed for wheezing or shortness of breath. 03/29/15   Jaclyn Shaggy, MD  albuterol (PROVENTIL) (2.5 MG/3ML) 0.083% nebulizer solution Take 3 mLs (2.5 mg total) by nebulization every 6 (six) hours as needed for wheezing or shortness of breath. 04/18/15   Jaclyn Shaggy, MD  amiodarone (PACERONE) 200 MG tablet Take 1 tablet (200 mg total) by mouth 2 (two) times daily. 09/18/15   Amy D Filbert Schilder, NP  atorvastatin (LIPITOR) 40 MG tablet Take 1 tablet (40 mg total) by mouth daily at 6 PM. 09/18/15   Amy D Clegg, NP  Blood Glucose Monitoring Suppl (ACCU-CHEK AVIVA) device Use as instructed 3 times daily before meals 04/18/15   Jaclyn Shaggy, MD  carvedilol (COREG) 3.125 MG tablet Take 1 tablet (3.125 mg total) by mouth 2 (two) times daily with a meal. 09/18/15   Amy D Clegg, NP  digoxin (LANOXIN) 0.125 MG tablet Take 0.5 tablets (0.0625 mg total) by mouth daily. 09/18/15   Amy D Clegg, NP  ferrous sulfate 325 (65 FE) MG EC tablet Take 325 mg by mouth daily with breakfast.     Historical Provider, MD  Fluticasone-Salmeterol (ADVAIR) 100-50 MCG/DOSE AEPB Inhale 1 puff into the lungs daily.    Historical Provider, MD    glipiZIDE (GLUCOTROL) 10 MG tablet Take 1 tablet (10 mg total) by mouth 2 (two) times daily before a meal. 04/04/15   Jaclyn Shaggy, MD  isosorbide-hydrALAZINE (BIDIL) 20-37.5 MG tablet Take 1 tablet by mouth 2 (two) times daily. 09/18/15   Amy D Clegg, NP  pantoprazole (PROTONIX) 40 MG tablet Take 40 mg by mouth daily as needed.    Historical Provider, MD  rivaroxaban (XARELTO) 20 MG TABS tablet Take 1 tablet (20 mg total) by mouth daily before supper. 09/18/15   Amy D Filbert Schilder, NP  spironolactone (ALDACTONE) 25 MG tablet Take 1 tablet (25 mg total) by mouth daily. 09/18/15   Amy D Clegg, NP  torsemide (DEMADEX) 20 MG tablet Take 4 tablets (80 mg total) by mouth 2 (two) times daily. 09/18/15   Amy D Filbert Schilder, NP  traMADol (ULTRAM) 50 MG tablet Take 1 tablet (50 mg total) by mouth every 8 (eight) hours as needed for moderate pain. 01/30/15   Jaclyn Shaggy, MD    Past Medical History: Past Medical History:  Diagnosis Date  . Atrial fibrillation (HCC)    RVR 10/2014  . Automatic implantable cardioverter-defibrillator in situ   . CAD (coronary artery disease) Sept 2013   s/p cardiac cath showing occlusion of small RCA with collaterals  . CHF (congestive heart failure) (HCC)    20 to 25 % EF and RV dysfunction by 07/2014 echo   .  Chronic anticoagulation    on xarelto.   . High cholesterol   . Hypertension   . Myocardial infarction (HCC) 2014  . Noncompliance    homelessness contributing.   . Peripheral arterial disease (HCC)   . Type II diabetes mellitus (HCC)     Past Surgical History: Past Surgical History:  Procedure Laterality Date  . CARDIAC CATHETERIZATION  09/2012  . CORONARY ANGIOPLASTY WITH STENT PLACEMENT  11/2011   "1"  . CORONARY ARTERY BYPASS GRAFT  09/2012   2 vessels per patient [name]   . ESOPHAGOGASTRODUODENOSCOPY N/A 11/28/2014   Procedure: ESOPHAGOGASTRODUODENOSCOPY (EGD);  Surgeon: Beverley Fiedler, MD;  Location: [facility] ENDOSCOPY;  Service: Endoscopy;  Laterality: N/A;  . ILIAC  ARTERY STENT Right 08/30/2013  . IMPLANTABLE CARDIOVERTER DEFIBRILLATOR IMPLANT     [place] in 07/2012; [employer]  . LEFT AND RIGHT HEART CATHETERIZATION WITH CORONARY ANGIOGRAM N/A 12/02/2011   Procedure: LEFT AND RIGHT HEART CATHETERIZATION WITH CORONARY ANGIOGRAM;  Surgeon: Kathleene Hazel, MD;  Location[facility] C[facility] LAB;  Service: Cardiovascular;  Laterality: N/A;  . LOWER EXTREMITY ANGIOGRAM N/A 08/30/2013   Procedure: LOWER EXTREMITY ANGIOGRAM;  Surgeon: Runell Gess, MD;  Location: [facility] CATH LAB;  Service: Cardiovascular;  Laterality: N/A;  . LOWER EXTREMITY ANGIOGRAM N/A 12/02/2013   Procedure: LOWER EXTREMITY ANGIOGRAM;  Surgeon: Runell Gess, MD;  Location: [facility] CATH LAB;  Service: Cardiovascular;  Laterality: N/A;    Family History: Family History  Problem Relation Age of Onset  . Diabetes Mother     Social History: Social History   Social History  . Marital status: Divorced    Spouse name: N/A  . Number of children: 3  . Years of education: N/A   Occupational History  . disabled    Social History Main Topics  . Smoking status: Current Every Day Smoker    Packs/day: 1.00    Years: 40.00    Types: Cigarettes  . Smokeless tobacco: Never Used  . Alcohol use No  . Drug use: No  . Sexual activity: No   Other Topics Concern  . None   Social History Narrative   Has an apartment with a roommate. He was living on the streets in January 20, 2013.  He reports that his father died in [place] in 2013/01/20.  He is divorced.  He is no longer estranged from his son, but still from his daughter who lives locally.  Neither of his parents, nor any siblings have any history of CAD.    Allergies:  Allergies  Allergen Reactions  . Tape Itching    Paper tape please.    Objective:    Vital Signs:   Temp:  [97.5 F (36.4 C)-98.5 F (36.9 C)] 97.5 F (36.4 C) (08/22 0335) Pulse Rate:  [97-116] 116 (08/22 0335) Resp:  [18-28] 18 (08/22 0335) BP: (93-119)/(57-63)  119/62 (08/22 0335) SpO2:  [92 %-98 %] 95 % (08/22 0335) Weight:  [212 lb 1.6 oz (96.2 kg)] 212 lb 1.6 oz (96.2 kg) (08/22 0335) Last BM Date: 11/13/15  Weight change: Filed Weights   11/12/15 0551 11/13/15 0445 11/14/15 0335  Weight: 227 lb 3.2 oz (103.1 kg) 217 lb 8 oz (98.7 kg) 212 lb 1.6 oz (96.2 kg)    Intake/Output:   Intake/Output Summary (Last 24 hours) at 11/14/15 0759 Last data filed at 11/14/15 0728  Gross per 24 hour  Intake             1692 ml  Output  4820 ml  Net            -3128 ml     Physical Exam: General:  No resp difficulty. In the chair HEENT: normal Neck: supple. JVP 8-9 cm. Carotids 2+ bilat; no bruits. No thyromegaly or nodule noted. Cor: PMI nondisplaced. Irregular rate & rhythm, tachy. No rubs, gallops, or murmurs noted.  Lungs: Slightly decreased basilar sounds, otherwise clear. Abdomen: soft, NT, ND, no HSM. No bruits or masses. +BS  Extremities: no cyanosis, clubbing, rash. Trace ankle edema. Neuro: alert & orientedx3, cranial nerves grossly intact. moves all 4 extremities w/o difficulty. Affect pleasant  Telemetry: Reviewed, Afib 110s Labs: Basic Metabolic Panel:  Recent Labs Lab 11/10/15 0317 11/11/15 0238 11/12/15 0200 11/13/15 0421 11/14/15 0340  NA 135 132* 131* 131* 129*  K 4.1 4.8 5.0 4.4 4.5  CL 96* 90* 87* 87* 86*  CO2 32 32 32 33* 33*  GLUCOSE 141* 242* 254* 191* 168*  BUN 27* 30* 38* 44* 53*  CREATININE 1.64* 1.63* 1.99* 1.86* 1.92*  CALCIUM 8.4* 8.7* 8.9 9.3 9.2    Liver Function Tests:  Recent Labs Lab 11/10/15 0317  AST 17  ALT 15*  ALKPHOS 149*  BILITOT 1.6*  PROT 6.8  ALBUMIN 3.0*   No results for input(s): LIPASE, AMYLASE in the last 168 hours. No results for input(s): AMMONIA in the last 168 hours.  CBC:  Recent Labs Lab 11/10/15 0317 11/11/15 0238 11/12/15 0200 11/13/15 0421 11/14/15 0340  WBC 8.3 8.6 11.1* 9.2 10.2  NEUTROABS 6.3 6.8 8.8* 7.0 7.6  HGB 12.8* 12.8* 12.3* 12.7*  12.9*  HCT 44.0 43.5 41.0 42.0 42.8  MCV 83.2 83.0 82.7 81.1 80.3  PLT 213 185 209 190 231    Cardiac Enzymes: No results for input(s): CKTOTAL, CKMB, CKMBINDEX, TROPONINI in the last 168 hours.  BNP: BNP (last 3 results)  Recent Labs  05/17/15 1559 08/15/15 1447 11/08/15 1450  BNP 777.8* 546.6* 748.2*    ProBNP (last 3 results)  Recent Labs  12/12/14 1614  PROBNP 1,892.00*     CBG:  Recent Labs Lab 11/13/15 0655 11/13/15 1109 11/13/15 1648 11/13/15 2145 11/14/15 0617  GLUCAP 204* 194* 231* 155* 212*    Coagulation Studies: No results for input(s): LABPROT, INR in the last 72 hours.  Other results: EKG: Afib   Imaging: No results found.   Medications:     Current Medications: . amiodarone  200 mg Oral BID  . aspirin EC  81 mg Oral Daily  . atorvastatin  40 mg Oral q1800  . carvedilol  3.125 mg Oral BID WC  . digoxin  0.0625 mg Oral Daily  . ferrous sulfate  325 mg Oral Q breakfast  . furosemide  80 mg Intravenous Q8H  . insulin aspart  0-15 Units Subcutaneous TID WC  . insulin aspart protamine- aspart  15 Units Subcutaneous BID WC  . isosorbide-hydrALAZINE  1 tablet Oral BID  . mometasone-formoterol  2 puff Inhalation BID  . nicotine  14 mg Transdermal Daily  . pantoprazole  40 mg Oral Daily  . rivaroxaban  20 mg Oral QAC supper  . sodium chloride flush  3 mL Intravenous Q12H  . spironolactone  25 mg Oral Daily    Infusions: . milrinone 0.125 mcg/kg/min (11/13/15 1718)     Assessment/Plan   Bethann PunchesYahia Bo is a 60 y.o. male with systolic CHF, Chronic Afib, DMII, H/o GI pleed, PAD, and history of non-compliance who presents with A/C systolic CHF in setting  of medical non-compliance x 2 weeks.   1. Acute on chronic Systolic Heart Failure - ICM EF 20-25%. Has [employer] ICD.  NYHA IIIb symptoms in setting of medication non-compliance for at least 2 weeks.   - Much better diuresis on milrinone. Weaned to 0.125 yesterday and turning off  today. Not candidate for home milrinone with non compliance and social situation.  - Creatinine up but relatively stable. One more dose IV lasix then transition to po for home.    - Continue spiro 25 mg  - Continue dig 0.0625 mg.  - Tolerating 3.125 mg BID of coreg.  - Continue bidil 1 tab tid.  2. Chronic A fib  - Continue Amio 200 mg BID and Xarelto. No bleeding problems. TSH ok.  - 3. DMII- Per PCP  4. H/O GI Bleed - duodenal ulcer with clip 9/5 - Hgb stable back on Xarelto. Continue to follow CBCs.  5. Smoking- Current smoker. Counseled on smoking cessation.  6. CAD: CABG in 2014 at [facility], no chest pain.  Continue atorvastatin.  - No ASA with stable disease on Xarelto  7. AKI on CKD: Stage III -  Up but stable. Transitioning to po meds for home.   Pt is NOT a candidate for advanced therapies with social situation and non compliance.  Social- Followed by Paramedicine in the community.   Still has mild volume overload but adamant he will go home today.  Will continue milrinone for dose of IV lasix this am, and turn off around 1000 am.  Will try to convince to stay to observe for transition to po meds.   HF meds for home Coreg 3.125 mg BID Amiodarone 200 mg BID atorvstatin 40 mg daily Digoxin 0.0625 mg daily Bidil 1 tablet BID Xarelto 20 mg daily Spiro 25 mg daily Torsemide 80 mg BID   Length of Stay: 5  Graciella Freer PA-C  11/14/2015, 7:59 AM   Advanced Heart Failure Team Pager [phone] (M-F; 7a - 4p)  Please contact [facility] for night-coverage after hours (4p -7a ) and weekends on [email or web address]   Agree with above.   Wheeler Incorvaia,MD 10:38 PM

## 2015-11-14 NOTE — Hospital Discharge Follow-Up (Signed)
Met with the patient prior to his discharge. Reminded him of his appointment at [facility] with the [facility] on 11/21/15 @ 1415. Also reminded him about the importance of picking up all of his medications at the [facility] after discharge and noted that if he is not able to afford all of the co-pays he can charge them to an account that can pay off over time. He stated that he understood and would be going to the pharmacy after he leave the hospital.  He said that his monthly disability  income is a little over $600 and his rent if $575. He has food stamps and medicare/medicaid. He noted that he still has furniture from the [employer] that he needs to pick up He said that he paid the $35 for the furniture - a couch , table and 2 chairs but he has not found someone who can pick up the furniture for him.

## 2015-11-14 NOTE — Progress Notes (Signed)
  RD consulted for nutrition education regarding diabetes. Pt is receiving insulin here in the hospital, but is refusing to continue insulin at home. Pt states that he has never received nutrition education regarding diabetes in the past.   Lab Results  Component Value Date   HGBA1C 11.0 (H) 11/09/2015   Reviewed patient's dietary recall. Breakfast: scrambled eggs, 2 slices wheat toast, 8 ounces of orange juice Snack: crackers, coffee with sugar, 12 oz cola Dinner: grilled chicken and vegetables  RD provided "Type 2 Diabetes Nutrition Therapy" handout from the Academy of Nutrition and Dietetics. Discussed different food groups and their effects on blood sugar, emphasizing carbohydrate-containing foods. Provided list of carbohydrates and recommended serving sizes of common foods.  Discussed importance of controlled and consistent carbohydrate intake throughout the day. Provided examples of ways to balance meals/snacks and encouraged intake of high-fiber, whole grain complex carbohydrates. Discussed the benefits of physical activity on lowering blood sugar. Pt states that he is lazy and unable to walk well due to leg pain. Encouraged patient to discussed physical activity options with his doctors. Teach back method used.  Expect fair compliance. Pt states that he can drink diet soda instead of regular and he can drink coffee without adding sugar.   Body mass index is 37.57 kg/m. Pt meets criteria for Obesity based on current BMI.  Current diet order is Heart Healthy/Carb Modified, patient is consuming approximately 50-100% of meals at this time. Labs and medications reviewed. No further nutrition interventions warranted at this time. RD contact information provided. If additional nutrition issues arise, please re-consult RD.  Dorothea Ogle RD, LDN, CSP Inpatient Clinical Dietitian Pager: [phone] After Hours Pager: [phone]

## 2015-11-14 NOTE — Care Management Important Message (Signed)
Important Message  Patient Details  Name: Burdell Pomper MRN: [id number] Date of Birth: 09-Dec-1955   Medicare Important Message Given:  Yes    Kylie Simmonds 11/14/2015, 10:18 AM

## 2015-11-15 ENCOUNTER — Telehealth: Payer: Self-pay

## 2015-11-15 NOTE — Telephone Encounter (Signed)
Call placed to the [facility] # [phone] to inquire about furniture delivery as the patient has paid for his furniture but does not have anyone who can pick it up for him. As per Deven, delivery charge is $75.. They will deliver in [place] only and the person must live on the first floor.

## 2015-11-15 NOTE — Telephone Encounter (Signed)
Transitional Care Clinic Post-discharge Follow-Up Phone Call:  Attempt #1  Date of Discharge: 11/14/2015 Principal Discharge Diagnosis(es): acute on chronic systolic and diastolic heart failure, atrial fibrillation, CAD, DM Post-discharge Communication: (Clearly document all attempts clearly and date contact made)  Call placed to the patient # [phone] and a HIPAA compliant voicemail message was left requesting a call back to # [phone] or [phone].  Call Completed: No

## 2015-11-15 NOTE — Telephone Encounter (Signed)
Call placed to Myer Peer, [facility] Liaison at [facility] # [phone].  She confirmed that the patient is still active with  [facility].

## 2015-11-16 ENCOUNTER — Encounter: Payer: Medicare Other | Admitting: Pharmacist

## 2015-11-16 ENCOUNTER — Telehealth: Payer: Self-pay

## 2015-11-16 MED FILL — VENTOLIN HFA 90 MCG INHALER: 108 (90 BAS | 28 days supply | Qty: 18 | Fill #0

## 2015-11-16 NOTE — Telephone Encounter (Signed)
Transitional Care Clinic Post-discharge Follow-Up Phone Call:  Date of Discharge: 11/14/15 Principal Discharge Diagnosis(es): Acute on chronic combined systolic and diastolic congestive heart failure  Call Completed: Yes                    With Whom: Patient    Please check all that apply:  X  Patient is knowledgeable of his/her condition(s) and/or treatment. X  Patient is caring for self at home.  ? Patient is receiving assist at home from family and/or caregiver. Family and/or caregiver is knowledgeable of patient's condition(s) and/or treatment. ? Patient is receiving home health services. If so, name of agency.     Medication Reconciliation:  ? Medication list reviewed with patient. X  Patient obtained all discharge medications-NO. Patient indicated he had not yet picked up his medications but indicated he was on his way to pick up his medications. Inquired if patient would like to see Juanita Craver, Pharmacist at [facility] after he picks up his medications for assist with medication management and for her to fill a medication box for him. Patient indicated he would but indicated he would prefer to see her later this afternoon. Medication management appointment scheduled today at 1530 with Juanita Craver, Pharmacist. Patient appreciative of appointment.   Activities of Daily Living:  X  Independent ? Needs assist  ? [facility]    Community resources in place for patient:  X  Patient is part of CHF Paramedicine Program and is also active with [facility]. ? Home Health/Home DME ? Assisted Living ? Support Group          Questions/Concerns discussed: This Case Manager placed call to patient to check on status and to complete post-discharge follow-up phone call. Patient indicated he was doing "good." Patient denied shortness of breath or chest pain. Patient indicated he was on his way to pick up his medications. Medication management  appointment scheduled on 11/16/15 at 1530 with Juanita Craver, Pharmacist.  Patient indicated he never received his furniture from the [employer]. Patient indicated he had a first floor apartment. Placed call to [facility] [phone]) to attempt to schedule his furniture delivery; however, was informed that his furniture request expired because patient never called to schedule his delivery. Was informed [employer] typically only holds referrals for 30 days but his was held over 90 days so [facility] [employer] will need to send in a new furniture referral.  Was informed that patient did have a credit on his account that will be applied to new referral request.  Call placed to Tywan with [facility] to determine contact information for his assigned Case Manager. Was informed patient's assigned Case Manager is [facility] [phone]). Updated her that a new furniture referral needs to be sent to [employer]. Hope indicated patient is new to her but she will follow-up on his needs and discuss with her Production designer, theatre/television/film.

## 2015-11-17 ENCOUNTER — Telehealth (HOSPITAL_COMMUNITY): Payer: Self-pay | Admitting: *Deleted

## 2015-11-17 ENCOUNTER — Telehealth: Payer: Self-pay

## 2015-11-17 NOTE — Telephone Encounter (Signed)
This Case Manager placed call to patient to ensure he had his medications and to review medication list as patient missed appointment for medication management with Pharmacist at [facility] on 11/16/15. Patient indicated he picked up all medications. He also indicated Jacob [name] with Paramedicine Program came to his house yesterday and reviewed his medications with him. He also indicated she set up his pill box. Patient declined need to review his medication list once again.  Reminded patient of his upcoming Transitional Care Clinic appointment on 11/21/15 at 1415, and patient indicated he would be at his appointment. No additional needs/concerns identified.

## 2015-11-17 NOTE — Telephone Encounter (Signed)
Completed PA for pt's Bidil, med was approved through 03/24/16

## 2015-11-20 ENCOUNTER — Telehealth: Payer: Self-pay

## 2015-11-20 NOTE — Telephone Encounter (Signed)
Call placed to the patient and reminded him of his appointment at the [facility] tomorrow, 11/21/15 @ 1415. He said that he would be there and he has transportation to the clinic. Reminded him to bring his medications with him to his appointment tomorrow and he stated that he would. Inquired if he has been checking his blood sugars and he stated that he still doesn't have a glucometer. The patient can discuss with Dr Venetia Night and he can  pick up a glucometer at [facility] when he is at the clinic tomorrow. He stated that he is feeling " good."   No concerns/questions reported at this time.

## 2015-11-21 ENCOUNTER — Inpatient Hospital Stay: Payer: Medicare Other | Admitting: Family Medicine

## 2015-11-22 ENCOUNTER — Other Ambulatory Visit: Payer: Self-pay | Admitting: Pharmacist

## 2015-11-22 ENCOUNTER — Inpatient Hospital Stay (HOSPITAL_COMMUNITY): Admit: 2015-11-22 | Payer: Medicare Other

## 2015-11-22 ENCOUNTER — Telehealth: Payer: Self-pay

## 2015-11-22 DIAGNOSIS — E1159 Type 2 diabetes mellitus with other circulatory complications: Secondary | ICD-10-CM

## 2015-11-22 MED ORDER — ACCU-CHEK SOFTCLIX LANCETS MISC: 12 refills | Status: DC

## 2015-11-22 MED ORDER — ACCU-CHEK AVIVA DEVI: 0 refills | Status: DC

## 2015-11-22 MED ORDER — GLUCOSE BLOOD VI STRP: ORAL_STRIP | 12 refills | Status: DC

## 2015-11-22 MED ORDER — ACCU-CHEK SOFTCLIX LANCET DEV MISC
0 refills | Status: DC
Start: 2015-11-22 — End: 2015-11-28

## 2015-11-22 NOTE — Telephone Encounter (Signed)
This Case Manager placed call to patient to follow-up. Patient indicated he was doing "okay" and had no questions or complaints at this time. Patient indicated he has his medications and is taking them as prescribed. Patient indicated he does not have a glucometer or diabetes testing supplies. Discussed with Juanita Craver, Pharmacist who indicated she would order glucometer and diabetes testing supplies for patient. Patient updated. Patient reminded of Transitional Care Clinic appointment on 11/28/15 at 1145. Patient indicated he would be at his upcoming appointment. Patient questioned if this Case Manager had found out any information about his furniture. Informed patient that this Case Manager spoke with an employee at the [employer] last week who indicated patient needed a new referral for furniture.  Patient informed that this Case Manager updated [facility] Case Manager that a new furniture referral needed.  Patient verbalized understanding.  This Case Manager placed call to [facility] (#[phone]) to follow-up on [facility] referral. [facility] indicated Lenna Gilford [phone] ext [phone]) is patient's assigned Case Manager. Placed call to Lenna Gilford, and patient's need for an additional furniture referral discussed. She indicated patient's furniture was not obtained before because patient has been on verge of homelessness several times due to difficulty paying rent. She indicated she has a home visit scheduled with him tomorrow at 1300, and she will address furniture referral. Also informed her to remind him to pick up glucometer, diabetes testing supplies. She indicated she would. Informed her of patient's appointment on 11/28/15 at 1145, and she indicated she would plan on accompanying patient to upcoming appointment. No additional needs/concerns identified.

## 2015-11-24 ENCOUNTER — Telehealth: Payer: Self-pay

## 2015-11-24 NOTE — Telephone Encounter (Signed)
This Case Manager placed call to patient to remind him of upcoming appointment on 11/28/15 at 1145. Patient aware of appointment and indicated he would be at his appointment. Inquired about patient's status. Patient denied shortness of breath and indicated he was "good." No health concerns or complaints at this time. Reminded patient to bring his medications to his appointment and inquired if patient picked up glucometer and diabetes testing supplies from the pharmacy. Patient indicated he had not picked them up yet. Thoroughly discussed importance of picking up diabetes testing supplies today so he could begin monitoring his blood glucose; however, patient indicated he would not be able to pick it up today. He indicated he would pick up glucometer and diabetes testing supplies at his upcoming appointment. No additional needs/concerns identified.

## 2015-11-28 ENCOUNTER — Other Ambulatory Visit: Payer: Medicare Other

## 2015-11-28 ENCOUNTER — Encounter: Payer: Self-pay | Admitting: Family Medicine

## 2015-11-28 ENCOUNTER — Other Ambulatory Visit: Payer: Self-pay | Admitting: Pharmacist

## 2015-11-28 ENCOUNTER — Ambulatory Visit: Payer: Medicare Other | Attending: Family Medicine | Admitting: Family Medicine

## 2015-11-28 ENCOUNTER — Telehealth: Payer: Self-pay

## 2015-11-28 VITALS — BP 112/68 | HR 110 | Temp 98.0°F | Ht 65.0 in | Wt 221.8 lb

## 2015-11-28 DIAGNOSIS — F17210 Nicotine dependence, cigarettes, uncomplicated: Secondary | ICD-10-CM | POA: Diagnosis not present

## 2015-11-28 DIAGNOSIS — Z95810 Presence of automatic (implantable) cardiac defibrillator: Secondary | ICD-10-CM | POA: Insufficient documentation

## 2015-11-28 DIAGNOSIS — Z7902 Long term (current) use of antithrombotics/antiplatelets: Secondary | ICD-10-CM | POA: Diagnosis not present

## 2015-11-28 DIAGNOSIS — Z716 Tobacco abuse counseling: Secondary | ICD-10-CM | POA: Diagnosis not present

## 2015-11-28 DIAGNOSIS — I110 Hypertensive heart disease with heart failure: Secondary | ICD-10-CM | POA: Insufficient documentation

## 2015-11-28 DIAGNOSIS — I5023 Acute on chronic systolic (congestive) heart failure: Secondary | ICD-10-CM | POA: Diagnosis not present

## 2015-11-28 DIAGNOSIS — I10 Essential (primary) hypertension: Secondary | ICD-10-CM | POA: Diagnosis not present

## 2015-11-28 DIAGNOSIS — Z9119 Patient's noncompliance with other medical treatment and regimen: Secondary | ICD-10-CM | POA: Insufficient documentation

## 2015-11-28 DIAGNOSIS — E1149 Type 2 diabetes mellitus with other diabetic neurological complication: Secondary | ICD-10-CM | POA: Insufficient documentation

## 2015-11-28 DIAGNOSIS — I2510 Atherosclerotic heart disease of native coronary artery without angina pectoris: Secondary | ICD-10-CM | POA: Insufficient documentation

## 2015-11-28 DIAGNOSIS — Z9109 Other allergy status, other than to drugs and biological substances: Secondary | ICD-10-CM | POA: Diagnosis not present

## 2015-11-28 DIAGNOSIS — Z720 Tobacco use: Secondary | ICD-10-CM

## 2015-11-28 DIAGNOSIS — I739 Peripheral vascular disease, unspecified: Secondary | ICD-10-CM | POA: Diagnosis not present

## 2015-11-28 DIAGNOSIS — E7800 Pure hypercholesterolemia, unspecified: Secondary | ICD-10-CM | POA: Diagnosis not present

## 2015-11-28 DIAGNOSIS — I252 Old myocardial infarction: Secondary | ICD-10-CM | POA: Diagnosis not present

## 2015-11-28 DIAGNOSIS — E1159 Type 2 diabetes mellitus with other circulatory complications: Secondary | ICD-10-CM | POA: Insufficient documentation

## 2015-11-28 DIAGNOSIS — Z951 Presence of aortocoronary bypass graft: Secondary | ICD-10-CM | POA: Diagnosis not present

## 2015-11-28 DIAGNOSIS — Z95828 Presence of other vascular implants and grafts: Secondary | ICD-10-CM | POA: Insufficient documentation

## 2015-11-28 DIAGNOSIS — I4891 Unspecified atrial fibrillation: Secondary | ICD-10-CM | POA: Diagnosis not present

## 2015-11-28 DIAGNOSIS — Z79899 Other long term (current) drug therapy: Secondary | ICD-10-CM | POA: Diagnosis not present

## 2015-11-28 DIAGNOSIS — Z5181 Encounter for therapeutic drug level monitoring: Secondary | ICD-10-CM

## 2015-11-28 DIAGNOSIS — E1140 Type 2 diabetes mellitus with diabetic neuropathy, unspecified: Secondary | ICD-10-CM | POA: Insufficient documentation

## 2015-11-28 MED ORDER — GLUCOSE BLOOD VI STRP: ORAL_STRIP | 12 refills | Status: DC

## 2015-11-28 MED ORDER — ACCU-CHEK AVIVA DEVI: 0 refills | Status: DC

## 2015-11-28 MED ORDER — ACCU-CHEK SOFTCLIX LANCETS MISC: 12 refills | Status: DC

## 2015-11-28 MED ORDER — ISOSORB DINITRATE-HYDRALAZINE 20-37.5 MG PO TABS: 1.0000 | ORAL_TABLET | Freq: Two times a day (BID) | ORAL | 5 refills | Status: DC

## 2015-11-28 MED ORDER — GABAPENTIN 300 MG PO CAPS: 300.0000 mg | ORAL_CAPSULE | Freq: Two times a day (BID) | ORAL | 5 refills | Status: DC

## 2015-11-28 MED ORDER — ACCU-CHEK SOFTCLIX LANCET DEV MISC: 0 refills | Status: AC

## 2015-11-28 MED ORDER — ACETAMINOPHEN-CODEINE #3 300-30 MG PO TABS: 1.0000 | ORAL_TABLET | Freq: Two times a day (BID) | ORAL | 1 refills | Status: DC | PRN

## 2015-11-28 NOTE — Progress Notes (Signed)
Transitional care clinic  Date of telephone encounter: 11/14/68  Hospitalization dates: 11/08/15 through 11/14/15  Subjective:    Patient ID: Jacob PunchesYahia [name], male    DOB: [birth year], 60 y.o.   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  He was recently hospitalized for acute CHF exacerbation as he had been noncompliance with medications due to cost. He received aggressive diuresis and was on milrinone which was later transitioned to torsemide. 2-D echo revealed EF of 25% to 30% (down from 30-35% previously), diffuse hypokinesis, severely reduced systolic function. His torsemide was increased from his previous home dose of 60 mg twice daily to 80 mg twice daily.  Today he complains of pain in his calves worse on the right; pain is worse with walking. He continues to smoke half a pack of cigarettes per day. Pain was previously controlled on tramadol but he states that this is not working at this time. He does have a vascular surgeon but has been noncompliant with appointments.  Endorses being out of his medications for the last 3-4 months; does not have a glucometer.  Past Medical History:  Diagnosis Date  . Atrial fibrillation (HCC)    RVR 10/2014  . Automatic implantable cardioverter-defibrillator in situ   . CAD (coronary artery disease) Sept 2013   s/p cardiac cath showing occlusion of small RCA with collaterals  . CHF (congestive heart failure) (HCC)    20 to 25 % EF and RV dysfunction by 07/2014 echo   . Chronic anticoagulation    on xarelto.   . High cholesterol   . Hypertension   . Myocardial infarction (HCC) 2014  . Noncompliance    homelessness contributing.   . Peripheral  arterial disease (HCC)   . Type II diabetes mellitus (HCC)     Past Surgical History:  Procedure Laterality Date  . CARDIAC CATHETERIZATION  09/2012  . CORONARY ANGIOPLASTY WITH STENT PLACEMENT  11/2011   "1"  . CORONARY ARTERY BYPASS GRAFT  09/2012   2 vessels per patient [name]([place])   . ESOPHAGOGASTRODUODENOSCOPY N/A 11/28/2014   Procedure: ESOPHAGOGASTRODUODENOSCOPY (EGD);  Surgeon: Beverley FiedlerJay M Pyrtle, MD;  [facility];  Service: Endoscopy;  Laterality: N/A;  . ILIAC ARTERY STENT Right 08/30/2013  . IMPLANTABLE CARDIOVERTER DEFIBRILLATOR IMPLANT     [place] in 07/2012; [employer]  . LEFT AND RIGHT HEART CATHETERIZATION WITH CORONARY ANGIOGRAM N/A 12/02/2011   Procedure: LEFT AND RIGHT HEART CATHETERIZATION WITH CORONARY ANGIOGRAM;  Surgeon: Kathleene Hazelhristopher D McAlhany, MD;  [facility];  Service: Cardiovascular;  Laterality: N/A;  . LOWER EXTREMITY ANGIOGRAM N/A 08/30/2013   Procedure: LOWER EXTREMITY ANGIOGRAM;  Surgeon: Runell GessJonathan J Berry, MD;  [facility];  Service: Cardiovascular;  Laterality: N/A;  . LOWER EXTREMITY ANGIOGRAM N/A 12/02/2013   Procedure: LOWER EXTREMITY ANGIOGRAM;  Surgeon: Runell GessJonathan J Berry, MD;  [facility];  Service: Cardiovascular;  Laterality: N/A;    Allergies  Allergen Reactions  . Tape Itching    Paper tape please.    Current Outpatient Prescriptions on File Prior to Visit  Medication Sig Dispense Refill  . albuterol (PROVENTIL HFA;VENTOLIN HFA) 108 (90 Base) MCG/ACT inhaler Inhale 2 puffs into the lungs every 6 (six) hours as needed for  wheezing or shortness of breath. 1 Inhaler 2  . albuterol (PROVENTIL) (2.5 MG/3ML) 0.083% nebulizer solution Take 3 mLs (2.5 mg total) by nebulization every 6 (six) hours as needed for wheezing or shortness of breath. 150 mL 1  . amiodarone (PACERONE) 200 MG tablet Take 1 tablet (200 mg total) by mouth 2 (two) times daily. 60 tablet 3  . atorvastatin (LIPITOR) 40 MG tablet Take 1 tablet (40 mg  total) by mouth daily at 6 PM. 30 tablet 2  . carvedilol (COREG) 3.125 MG tablet Take 1 tablet (3.125 mg total) by mouth 2 (two) times daily with a meal. 60 tablet 2  . ferrous sulfate 325 (65 FE) MG EC tablet Take 325 mg by mouth 3 (three) times daily with meals.     . Fluticasone-Salmeterol (ADVAIR) 100-50 MCG/DOSE AEPB Inhale 1 puff into the lungs daily.    Marland Kitchen glipiZIDE (GLUCOTROL) 10 MG tablet Take 1 tablet (10 mg total) by mouth 2 (two) times daily before a meal. 60 tablet 2  . pantoprazole (PROTONIX) 40 MG tablet Take 40 mg by mouth daily.     . rivaroxaban (XARELTO) 20 MG TABS tablet Take 1 tablet (20 mg total) by mouth daily before supper. 30 tablet 5  . spironolactone (ALDACTONE) 25 MG tablet Take 1 tablet (25 mg total) by mouth daily. 30 tablet 5  . torsemide (DEMADEX) 20 MG tablet Take 4 tablets (80 mg total) by mouth 2 (two) times daily. 240 tablet 3  . digoxin (LANOXIN) 0.125 MG tablet Take 0.5 tablets (0.0625 mg total) by mouth daily. (Patient not taking: Reported on 11/28/2015) 15 tablet 5   No current facility-administered medications on file prior to visit.      Review of Systems Constitutional: Negative for activity change and appetite change.  HENT: Negative for sinus pressure and sore throat.   Eyes: Negative for visual disturbance.  Respiratory: Negative for cough, chest tightness and shortness of breath.   Cardiovascular: Negative for chest pain and leg swelling.       Positive for intermittent claudication  Gastrointestinal: Negative for abdominal pain, diarrhea, constipation and abdominal distention.  Endocrine: Negative.   Genitourinary: Negative for dysuria.  Musculoskeletal: Negative for myalgias and joint swelling.  Skin: Negative for rash.  Allergic/Immunologic: Negative.   Neurological: Negative for weakness, light-headedness and numbness.  Psychiatric/Behavioral: Negative for suicidal ideas and dysphoric mood.        Objective: Vitals:   11/28/15 1204    BP: 112/68  Pulse: (!) 110  Temp: 98 F (36.7 C)  TempSrc: Oral  SpO2: 95%  Weight: 221 lb 12.8 oz (100.6 kg)  Height: 5\' 5"  (1.651 m)      Physical Exam  Constitutional: He is oriented to person, place, and time. He appears well-developed and well-nourished.  Neck: Normal range of motion. No JVD. Cardiovascular: Tachycardic rate, regular rhythm and normal heart sounds.   No murmur heard. Pulmonary/Chest: Effort normal and breath sounds normal. No respiratory distress. He has no wheezes. He exhibits no tenderness.  Abdominal: Soft. Bowel sounds are normal. He exhibits no mass. There is no tenderness.  Musculoskeletal: Normal range of motion. He exhibits no edema or tenderness.  Neurological: He is alert and oriented to person, place, and time.  Skin: Skin is warm and dry.  Psychiatric: He has a normal mood and affect.       Assessment & Plan:  1. DM (diabetes mellitus), type 2 with peripheral vascular complications (HCC) and neuropathy A1c is 11.0 which has  trended up from 7.9 Largely due to being out of medications He is resisting initiation of insulin; if blood sugar log reveals hyperglycemia at next visit we might have to consider insulin Unable to place on Invokana or Farxiga Commenced on Neurontin- side effects discussed Advised to adhere to an ADA diet. - Glucose (CBG)  2. Essential hypertension Controlled. Continue antihypertensives  3. ICD - in place- BS May 2014 [place] Stable  4. Coronary artery disease involving native coronary artery of native heart without angina pectoris Stable  5. Chronic combined systolic and diastolic heart failure (HCC) NYHA class IV, EF of 30-35% from 2-D echo of 11/2014 Advised on low-sodium diet, limiting fluid intake to less than 2 L per day. No evidence of acute exacerbation of this time. Closely followed by the heart failure clinic - advised to call to schedule an appointment. BMET at next visit   6. PVD (peripheral  vascular disease) (HCC) Continues to have claudication pain Advised against smoking Tylenol#3 for pain as Tramadol is not working Seen by vascular in recent past   7. Chronic atrial fibrillation (HCC) Currently in sinus rhythm but is tachycardic Has been out of digoxin which should hopefully help with this Continue amiodarone, Xarelto   8. Tobacco use disorder  Spent 3 minutes counseling on cessation and he is not ready to quit at this time.  This note has been created with Education officer, environmental. Any transcriptional errors are unintentional.

## 2015-11-28 NOTE — Telephone Encounter (Signed)
Attempted to contact the patient's case manager from [facility] - Brunetta Terry # [phone] to inquire if she is still planning to meet the patient at [facility] as he is at the clinic for his appointment.  Met with the patient when he was in the clinic for his appointment today. Informed him that as per Deyonna Joyce, Pharmacy Tech, he will need to get his prescription for the glucometer filled at an outside pharmacy as it is covered by Medicare  - part B and the [facility] pharmacy is not able to fill a medicare part B prescription. He stated that there is a [facility] on [address]. Reminded him of the importance of coming to his appointment for labwork tomorrow and he stated that he would. He also verbalized that he understands that he needs to be fasting for the labwork and he chose the lab appointment at 1400 because he said that he will not be up before that. His medications that were being ordered from the visit today were not received in the pharmacy when he was ready to leave and he said that he would pick them up tomorrow.  Call returned from B. Terry, [facility] who stated that she was unable to meet the patient at his appointment today. Informed her that the patient is scheduled to return to the clinic tomorrow for labwork  - 11/29/15 @ 1400 . The patient chose the time. Informed her that the patient is also followed by the paramedicine program in the community and this CM would get the contact information for her.  She also noted that she will be making the referral to the [employer] [facility] for furniture for the patient. Informed her that the prescription for the glucometer will need to [facility] [address] and [facility] [address],.   This CM spoke to Stacey Karl, RPH regarding the need for the glucometer. She sent the prescription for the glucometer and testing supplies to [facility] [address].    Call placed to Daphne Woods, Heart Program  Coordinator to inquire about the contact information for the paramedicine program.  She noted that [name] is the paramedic # [phone] who sees the patient when he is agreeable. Informed her that [facility] is also involved with the patient.  Call placed to B. Terry, [facility] and provided her with the contact # for [name] - paramedicine. Also informed her that the prescription for the glucometer was [facility] to [facility] [address].    

## 2015-11-28 NOTE — Progress Notes (Signed)
Medication refill- never picked up digoxin or bidil Took tramadol for 3 months-"doesn't work"

## 2015-11-29 ENCOUNTER — Telehealth: Payer: Self-pay

## 2015-11-29 ENCOUNTER — Other Ambulatory Visit: Payer: Medicare Other

## 2015-11-29 NOTE — Telephone Encounter (Signed)
This Case Manager placed call to patient to remind him of his lab appointment today at 1400. Patient indicated he was aware of his appointment. Also reminded patient he will need to pick up Neurontin (300 mg twice daily) and Bidil (1 tablet twice daily) today from [facility] [facility] [employer]. Patient verbalized understanding. Informed patient that scripts for glucometer and diabetes monitoring supplies [facility] [address]. Discussed importance of picking up glucometer and diabetes testing supplies today.  Informed patient that Dr. Venetia Night wants him to begin checking his blood glucose three times/day before meals and keeping a record of his blood glucose readings. Inquired if patient knew how to use a glucometer, and patient indicated he did. He also indicated [name] with [facility] would be coming to his house on 11/30/15 to refill his medication box, and she could show him how to use glucometer if he has any questions. Again, reiterated the importance of medication compliance and stressed importance of picking up his medications, glucometer, and diabetes monitoring supplies today. Patient verbalized understanding.  Placed call to [name] with [facility]. Provided update on patient. Informed her that patient has been advised to pick up medications today from [facility] and [facility] as well as pick up glucometer and diabetes monitoring [facility] [address]. [name] confirmed she will be doing a home visit tomorrow and indicated she will fill his medication box. She indicated she will ensure patient knows how to use his glucometer and encourage him to keep a log of his blood glucose readings. She indicated she always stresses the importance of medication compliance with patient; however, she indicated she will often find medications patient should have taken left in his medication box. She indicated [phone] is best number to reach  her.

## 2015-12-04 ENCOUNTER — Telehealth: Payer: Self-pay

## 2015-12-04 NOTE — Telephone Encounter (Signed)
Call placed to the patient to check on his status and to inquire if he has picked up his medications and glucometer. He said that he is " feeling good" and was working at the time of this CM call. He stated that he will pick up his medications at [facility] pharmacy tomorrow  as he still needs to have his blood work done.  He missed the appointment that he had scheduled at the lab on 11/29/15. He stated that tomorrow  he has to meet with his nurse, Florentina Addison, at 1400 and he will come to the lab after that. Reminded him that he needs to be fasting for the lab work and it will be a long time to fast if he needs to meet with Florentina Addison first. Informed him that he could come to the lab another day and he was adamant that he will come tomorrow and will be fasting.  He also said that he [facility]  He noted that he still needs to call medicaid to obtain a copy of his medicaid card. Informed him that the [facility] can provide him with his medicaid number if needed when he comes to the clinic tomorrow.  He was very appreciative of the call.  No other concerns/questions reported.

## 2015-12-05 ENCOUNTER — Telehealth (HOSPITAL_COMMUNITY): Payer: Self-pay | Admitting: *Deleted

## 2015-12-05 NOTE — Telephone Encounter (Signed)
[name] called to give update on pt, she states his wt today was 222 lb, he was 209 lb on 8/24, she feels that was a low reading.  Pt was 212 last time he wad d/c'd from hospital.  She states pt does not have any edema/SOB.  He has missed several doses of medications, she has again advised him on importance of taking meds correctly.  He does have a f/u appt next week.  Advised nothing further to be done since pt is asymptomatic, he just needs to take meds as ordered

## 2015-12-06 ENCOUNTER — Telehealth: Payer: Self-pay

## 2015-12-06 NOTE — Telephone Encounter (Signed)
Call placed to the patient to pick up his medications at [facility] pharmacy. He said that he would pick them up today. He also inquired about obtaining his medicaid number. Instructed him to ask at the front desk when he comes to the clinic. Also reminded him [facility] and he stated that he would. In addition, reminded him of the need to have his fasting blood work done and he said he would do that tomorrow.   Overall he said he was feeling " good,good."

## 2015-12-07 ENCOUNTER — Telehealth: Payer: Self-pay

## 2015-12-07 ENCOUNTER — Other Ambulatory Visit: Payer: Medicare Other

## 2015-12-07 NOTE — Telephone Encounter (Signed)
This Case Manager placed call to patient to remind him to pick up medications from [facility] and [facility] pharmacy and to get labwork done. Patient indicated he would pick up medications today and indicated he would be at clinic at 1500 for labwork. Lab appointment scheduled. Patient has been informed he needs to be fasting for labwork. This Case Manager also reminded patient he still needs to [facility]. Patient indicated he plans to also pick up glucometer today. Inquired if patient would like to meet with Juanita Craver, Pharmacist after he picks up glucometer so he can be shown how to use glucometer, and patient declined. Patient indicated he knew how to use a glucometer. No additional concerns/questions reported.

## 2015-12-12 ENCOUNTER — Encounter (HOSPITAL_COMMUNITY): Payer: Medicare Other

## 2015-12-13 ENCOUNTER — Telehealth: Payer: Self-pay

## 2015-12-13 NOTE — Telephone Encounter (Signed)
Call placed to the patient to inquire if he has picked up his medications and glucometer. He said that he has not but he will do it tomorrow. He also said that he will come to the clinic early tomorrow to have his blood drawn and he knows that he needs to be fasting. He said that he is planning to go to [place] possibly next week to see his family and he wants to make sure he has his medications. He noted that he may be gone for 25 days.   No other questions/concerns reported at this time.

## 2015-12-14 ENCOUNTER — Telehealth: Payer: Self-pay

## 2015-12-14 NOTE — Telephone Encounter (Signed)
This Case Manager placed call to patient to remind him of need for fasting labwork and picking up medications, glucometer. Patient indicated he plans to come to clinic around 1400 for labwork. Patient indicated he is aware he needed to be fasting. Patient indicated he planned to pick up medications and glucometer today as well. Attempted to schedule labwork for today at 1400; however, time unavailable. Call placed to patient to schedule an alternate time; however, unable to reach patient. Voicemail left requesting return call.

## 2015-12-18 ENCOUNTER — Telehealth (HOSPITAL_COMMUNITY): Payer: Self-pay | Admitting: Surgery

## 2015-12-18 ENCOUNTER — Telehealth: Payer: Self-pay

## 2015-12-18 NOTE — Telephone Encounter (Signed)
Jacob [name] was discussed at the biweekly [employer].  Due to the lack of his availability for home visits and No-shows to the [facility] he will be discharged from the [facility] at this time.

## 2015-12-18 NOTE — Telephone Encounter (Signed)
Attempted to contact the patient to check on his status and to remind him of his appointment at the [facility] tomorrow, 12/19/15 @ 1500 and his need for fasting lab work  and to also inquire if has picked up his medications and glucometer.  Call placed to # [phone] (H) and a HIPAA compliant voicemail message was left requesting a call back to # [phone] or [phone].

## 2015-12-19 ENCOUNTER — Ambulatory Visit: Payer: Medicare Other | Admitting: Family Medicine

## 2015-12-25 ENCOUNTER — Telehealth: Payer: Self-pay

## 2015-12-25 NOTE — Telephone Encounter (Signed)
Call received from [facility], [facility].to discuss the current challenges that she is facing with the patient. She stated that she is aware that the patient has missed appointments and is being discharged from the para-medicine program as he has not been home for appointments. She said that she has offered him enhanced pharmacy services - bubble packed medications and he has refused. She said that he also told her that he lost his phone and record of appointments and he has lost his glucometer.  Explained to her that the last time this CM spoke to the patient, he had not yet [facility] and he had not picked up medications at the pharmacy or had his fasting lab work done. Also informed her that he had informed this CM that he was planning to go to [place] in the near future.   Ms Jacob [name] stated that she will attempt to contact the patient again and discuss scheduling another appointment at [facility] and she ( Ms Jacob [name]) would meet him at the appointment and a care conference could be done with [facility] CM at that time.

## 2015-12-26 ENCOUNTER — Telehealth: Payer: Self-pay

## 2015-12-26 NOTE — Telephone Encounter (Signed)
Call placed to Lenna Gilford, [facility] and confirmed an appointment for the patient on 01/04/16 @ 1115.  She also confirmed this date/time with the patient while this CM was on the phone. She said that she plans to meet the patient at the clinic and would then like to meet with the [facility] CM and the patient after his appointment with Dr Venetia Night to discuss his current care needs.

## 2016-01-01 ENCOUNTER — Encounter (HOSPITAL_COMMUNITY): Payer: Self-pay | Admitting: Emergency Medicine

## 2016-01-01 ENCOUNTER — Emergency Department (HOSPITAL_COMMUNITY): Payer: Medicare Other

## 2016-01-01 ENCOUNTER — Inpatient Hospital Stay (HOSPITAL_COMMUNITY)
Admission: EM | Admit: 2016-01-01 | Discharge: 2016-01-12 | DRG: 291 | Disposition: A | Discharge status: Home / Self Care | Payer: Medicare Other | Attending: Internal Medicine | Admitting: Internal Medicine

## 2016-01-01 DIAGNOSIS — G4700 Insomnia, unspecified: Secondary | ICD-10-CM | POA: Diagnosis present

## 2016-01-01 DIAGNOSIS — R740 Nonspecific elevation of levels of transaminase and lactic acid dehydrogenase [LDH]: Secondary | ICD-10-CM | POA: Diagnosis present

## 2016-01-01 DIAGNOSIS — E7800 Pure hypercholesterolemia, unspecified: Secondary | ICD-10-CM | POA: Diagnosis present

## 2016-01-01 DIAGNOSIS — I248 Other forms of acute ischemic heart disease: Secondary | ICD-10-CM | POA: Diagnosis present

## 2016-01-01 DIAGNOSIS — I130 Hypertensive heart and chronic kidney disease with heart failure and stage 1 through stage 4 chronic kidney disease, or unspecified chronic kidney disease: Principal | ICD-10-CM | POA: Diagnosis present

## 2016-01-01 DIAGNOSIS — Z7984 Long term (current) use of oral hypoglycemic drugs: Secondary | ICD-10-CM

## 2016-01-01 DIAGNOSIS — R188 Other ascites: Secondary | ICD-10-CM | POA: Diagnosis not present

## 2016-01-01 DIAGNOSIS — E1122 Type 2 diabetes mellitus with diabetic chronic kidney disease: Secondary | ICD-10-CM | POA: Diagnosis not present

## 2016-01-01 DIAGNOSIS — Z833 Family history of diabetes mellitus: Secondary | ICD-10-CM

## 2016-01-01 DIAGNOSIS — R069 Unspecified abnormalities of breathing: Secondary | ICD-10-CM | POA: Diagnosis not present

## 2016-01-01 DIAGNOSIS — E1151 Type 2 diabetes mellitus with diabetic peripheral angiopathy without gangrene: Secondary | ICD-10-CM | POA: Diagnosis not present

## 2016-01-01 DIAGNOSIS — R358 Other polyuria: Secondary | ICD-10-CM | POA: Diagnosis not present

## 2016-01-01 DIAGNOSIS — E871 Hypo-osmolality and hyponatremia: Secondary | ICD-10-CM | POA: Diagnosis present

## 2016-01-01 DIAGNOSIS — Z6836 Body mass index (BMI) 36.0-36.9, adult: Secondary | ICD-10-CM

## 2016-01-01 DIAGNOSIS — Z955 Presence of coronary angioplasty implant and graft: Secondary | ICD-10-CM

## 2016-01-01 DIAGNOSIS — I5023 Acute on chronic systolic (congestive) heart failure: Secondary | ICD-10-CM | POA: Diagnosis not present

## 2016-01-01 DIAGNOSIS — Z7951 Long term (current) use of inhaled steroids: Secondary | ICD-10-CM

## 2016-01-01 DIAGNOSIS — I2510 Atherosclerotic heart disease of native coronary artery without angina pectoris: Secondary | ICD-10-CM | POA: Diagnosis present

## 2016-01-01 DIAGNOSIS — Z7901 Long term (current) use of anticoagulants: Secondary | ICD-10-CM | POA: Diagnosis not present

## 2016-01-01 DIAGNOSIS — J810 Acute pulmonary edema: Secondary | ICD-10-CM | POA: Diagnosis not present

## 2016-01-01 DIAGNOSIS — Z720 Tobacco use: Secondary | ICD-10-CM

## 2016-01-01 DIAGNOSIS — N178 Other acute kidney failure: Secondary | ICD-10-CM | POA: Diagnosis not present

## 2016-01-01 DIAGNOSIS — T463X5A Adverse effect of coronary vasodilators, initial encounter: Secondary | ICD-10-CM | POA: Diagnosis not present

## 2016-01-01 DIAGNOSIS — R079 Chest pain, unspecified: Secondary | ICD-10-CM | POA: Diagnosis not present

## 2016-01-01 DIAGNOSIS — I10 Essential (primary) hypertension: Secondary | ICD-10-CM

## 2016-01-01 DIAGNOSIS — I110 Hypertensive heart disease with heart failure: Secondary | ICD-10-CM | POA: Diagnosis not present

## 2016-01-01 DIAGNOSIS — T502X5A Adverse effect of carbonic-anhydrase inhibitors, benzothiadiazides and other diuretics, initial encounter: Secondary | ICD-10-CM | POA: Diagnosis not present

## 2016-01-01 DIAGNOSIS — E1140 Type 2 diabetes mellitus with diabetic neuropathy, unspecified: Secondary | ICD-10-CM | POA: Diagnosis present

## 2016-01-01 DIAGNOSIS — Z79899 Other long term (current) drug therapy: Secondary | ICD-10-CM

## 2016-01-01 DIAGNOSIS — I482 Chronic atrial fibrillation: Secondary | ICD-10-CM | POA: Diagnosis present

## 2016-01-01 DIAGNOSIS — N183 Chronic kidney disease, stage 3 (moderate): Secondary | ICD-10-CM | POA: Diagnosis present

## 2016-01-01 DIAGNOSIS — I5043 Acute on chronic combined systolic (congestive) and diastolic (congestive) heart failure: Secondary | ICD-10-CM | POA: Diagnosis present

## 2016-01-01 DIAGNOSIS — F17210 Nicotine dependence, cigarettes, uncomplicated: Secondary | ICD-10-CM | POA: Diagnosis present

## 2016-01-01 DIAGNOSIS — Z950 Presence of cardiac pacemaker: Secondary | ICD-10-CM

## 2016-01-01 DIAGNOSIS — I252 Old myocardial infarction: Secondary | ICD-10-CM

## 2016-01-01 DIAGNOSIS — Z9114 Patient's other noncompliance with medication regimen: Secondary | ICD-10-CM

## 2016-01-01 DIAGNOSIS — I4891 Unspecified atrial fibrillation: Secondary | ICD-10-CM | POA: Diagnosis present

## 2016-01-01 DIAGNOSIS — I484 Atypical atrial flutter: Secondary | ICD-10-CM | POA: Diagnosis not present

## 2016-01-01 DIAGNOSIS — I509 Heart failure, unspecified: Secondary | ICD-10-CM | POA: Insufficient documentation

## 2016-01-01 DIAGNOSIS — R0602 Shortness of breath: Secondary | ICD-10-CM | POA: Diagnosis not present

## 2016-01-01 DIAGNOSIS — Z951 Presence of aortocoronary bypass graft: Secondary | ICD-10-CM

## 2016-01-01 DIAGNOSIS — J449 Chronic obstructive pulmonary disease, unspecified: Secondary | ICD-10-CM | POA: Diagnosis present

## 2016-01-01 DIAGNOSIS — I481 Persistent atrial fibrillation: Secondary | ICD-10-CM | POA: Diagnosis not present

## 2016-01-01 DIAGNOSIS — Z91048 Other nonmedicinal substance allergy status: Secondary | ICD-10-CM

## 2016-01-01 DIAGNOSIS — I255 Ischemic cardiomyopathy: Secondary | ICD-10-CM | POA: Diagnosis present

## 2016-01-01 LAB — CBC WITH DIFFERENTIAL/PLATELET
BASOS ABS: 0 10*3/uL (ref 0.0–0.1)
BASOS PCT: 1 %
EOS ABS: 0.1 10*3/uL (ref 0.0–0.7)
Eosinophils Relative: 1 %
HEMATOCRIT: 41.1 % (ref 39.0–52.0)
HEMOGLOBIN: 12.5 g/dL — AB (ref 13.0–17.0)
Lymphocytes Relative: 15 %
Lymphs Abs: 1.1 10*3/uL (ref 0.7–4.0)
MCH: 25.6 pg — ABNORMAL LOW (ref 26.0–34.0)
MCHC: 30.4 g/dL (ref 30.0–36.0)
MCV: 84.2 fL (ref 78.0–100.0)
MONO ABS: 0.9 10*3/uL (ref 0.1–1.0)
Monocytes Relative: 12 %
NEUTROS ABS: 5.3 10*3/uL (ref 1.7–7.7)
NEUTROS PCT: 72 %
Platelets: 259 10*3/uL (ref 150–400)
RBC: 4.88 MIL/uL (ref 4.22–5.81)
RDW: 19.1 % — AB (ref 11.5–15.5)
WBC: 7.4 10*3/uL (ref 4.0–10.5)

## 2016-01-01 LAB — COMPREHENSIVE METABOLIC PANEL
ALBUMIN: 3.4 g/dL — AB (ref 3.5–5.0)
ALT: 16 U/L — ABNORMAL LOW (ref 17–63)
ANION GAP: 10 (ref 5–15)
AST: 49 U/L — AB (ref 15–41)
Alkaline Phosphatase: 160 U/L — ABNORMAL HIGH (ref 38–126)
BILIRUBIN TOTAL: 2.2 mg/dL — AB (ref 0.3–1.2)
BUN: 35 mg/dL — AB (ref 6–20)
CO2: 26 mmol/L (ref 22–32)
Calcium: 8.3 mg/dL — ABNORMAL LOW (ref 8.9–10.3)
Chloride: 97 mmol/L — ABNORMAL LOW (ref 101–111)
Creatinine, Ser: 1.61 mg/dL — ABNORMAL HIGH (ref 0.61–1.24)
GFR calc Af Amer: 53 mL/min — ABNORMAL LOW (ref >60)
GFR calc non Af Amer: 46 mL/min — ABNORMAL LOW (ref >60)
GLUCOSE: 161 mg/dL — AB (ref 65–99)
POTASSIUM: 5.7 mmol/L — AB (ref 3.5–5.1)
SODIUM: 133 mmol/L — AB (ref 135–145)
TOTAL PROTEIN: 7.2 g/dL (ref 6.5–8.1)

## 2016-01-01 LAB — GLUCOSE, CAPILLARY: GLUCOSE-CAPILLARY: 267 mg/dL — AB (ref 65–99)

## 2016-01-01 LAB — BRAIN NATRIURETIC PEPTIDE: B Natriuretic Peptide: 568.6 pg/mL — ABNORMAL HIGH (ref 0.0–100.0)

## 2016-01-01 LAB — TROPONIN I
Troponin I: 0.05 ng/mL (ref <0.03)
Troponin I: 0.05 ng/mL (ref <0.03)

## 2016-01-01 MED ORDER — ONDANSETRON HCL 4 MG/2ML IJ SOLN: 4.0000 mg | Freq: Four times a day (QID) | INTRAMUSCULAR | Status: DC | PRN

## 2016-01-01 MED ORDER — FUROSEMIDE 10 MG/ML IJ SOLN
80.0000 mg | Freq: Once | INTRAMUSCULAR | Status: AC
  Administered 2016-01-01: 80 mg via INTRAVENOUS
  Filled 2016-01-01: qty 8

## 2016-01-01 MED ORDER — SODIUM CHLORIDE 0.9% FLUSH
3.0000 mL | Freq: Two times a day (BID) | INTRAVENOUS | Status: DC
  Administered 2016-01-01 – 2016-01-11 (×17): 3 mL via INTRAVENOUS

## 2016-01-01 MED ORDER — SODIUM CHLORIDE 0.9% FLUSH: 3.0000 mL | INTRAVENOUS | Status: DC | PRN

## 2016-01-01 MED ORDER — NICOTINE 21 MG/24HR TD PT24
21.0000 mg | MEDICATED_PATCH | Freq: Every day | TRANSDERMAL | Status: DC
  Administered 2016-01-01 – 2016-01-12 (×12): 21 mg via TRANSDERMAL
  Filled 2016-01-01 (×12): qty 1

## 2016-01-01 MED ORDER — FUROSEMIDE 10 MG/ML IJ SOLN
100.0000 mg | Freq: Two times a day (BID) | INTRAVENOUS | Status: DC
  Administered 2016-01-02 – 2016-01-03 (×3): 100 mg via INTRAVENOUS
  Filled 2016-01-01 (×5): qty 10

## 2016-01-01 MED ORDER — IPRATROPIUM-ALBUTEROL 0.5-2.5 (3) MG/3ML IN SOLN: 3.0000 mL | RESPIRATORY_TRACT | Status: DC | PRN

## 2016-01-01 MED ORDER — BUDESONIDE 0.5 MG/2ML IN SUSP
0.5000 mg | Freq: Two times a day (BID) | RESPIRATORY_TRACT | Status: DC
  Administered 2016-01-01 – 2016-01-12 (×22): 0.5 mg via RESPIRATORY_TRACT
  Filled 2016-01-01 (×22): qty 2

## 2016-01-01 MED ORDER — ARFORMOTEROL TARTRATE 15 MCG/2ML IN NEBU
15.0000 ug | INHALATION_SOLUTION | Freq: Two times a day (BID) | RESPIRATORY_TRACT | Status: DC
  Administered 2016-01-01 – 2016-01-12 (×22): 15 ug via RESPIRATORY_TRACT
  Filled 2016-01-01 (×22): qty 2

## 2016-01-01 MED ORDER — AMIODARONE HCL 200 MG PO TABS
200.0000 mg | ORAL_TABLET | Freq: Two times a day (BID) | ORAL | Status: DC
  Administered 2016-01-01 – 2016-01-08 (×14): 200 mg via ORAL
  Filled 2016-01-01 (×14): qty 1

## 2016-01-01 MED ORDER — ATORVASTATIN CALCIUM 40 MG PO TABS
40.0000 mg | ORAL_TABLET | Freq: Every day | ORAL | Status: DC
  Administered 2016-01-02 – 2016-01-11 (×10): 40 mg via ORAL
  Filled 2016-01-01 (×10): qty 1

## 2016-01-01 MED ORDER — IPRATROPIUM-ALBUTEROL 0.5-2.5 (3) MG/3ML IN SOLN
3.0000 mL | Freq: Once | RESPIRATORY_TRACT | Status: AC
  Administered 2016-01-01: 3 mL via RESPIRATORY_TRACT
  Filled 2016-01-01: qty 3

## 2016-01-01 MED ORDER — DIGOXIN 125 MCG PO TABS
0.0625 mg | ORAL_TABLET | Freq: Every day | ORAL | Status: DC
  Administered 2016-01-01 – 2016-01-12 (×12): 0.0625 mg via ORAL
  Filled 2016-01-01 (×12): qty 1

## 2016-01-01 MED ORDER — ALBUTEROL SULFATE (2.5 MG/3ML) 0.083% IN NEBU: 2.5000 mg | INHALATION_SOLUTION | Freq: Four times a day (QID) | RESPIRATORY_TRACT | Status: DC | PRN

## 2016-01-01 MED ORDER — ACETAMINOPHEN 325 MG PO TABS: 650.0000 mg | ORAL_TABLET | ORAL | Status: DC | PRN

## 2016-01-01 MED ORDER — GABAPENTIN 300 MG PO CAPS
300.0000 mg | ORAL_CAPSULE | Freq: Two times a day (BID) | ORAL | Status: DC
Start: 2016-01-01 — End: 2016-01-12
  Administered 2016-01-01 – 2016-01-12 (×22): 300 mg via ORAL
  Filled 2016-01-01 (×22): qty 1

## 2016-01-01 MED ORDER — INSULIN ASPART 100 UNIT/ML ~~LOC~~ SOLN
0.0000 [IU] | Freq: Three times a day (TID) | SUBCUTANEOUS | Status: DC
  Administered 2016-01-02: 2 [IU] via SUBCUTANEOUS
  Administered 2016-01-02 – 2016-01-03 (×2): 3 [IU] via SUBCUTANEOUS
  Administered 2016-01-03: 2 [IU] via SUBCUTANEOUS

## 2016-01-01 MED ORDER — SPIRONOLACTONE 25 MG PO TABS
25.0000 mg | ORAL_TABLET | Freq: Every day | ORAL | Status: DC
  Administered 2016-01-02 – 2016-01-11 (×10): 25 mg via ORAL
  Filled 2016-01-01 (×11): qty 1

## 2016-01-01 MED ORDER — SODIUM CHLORIDE 0.9 % IV SOLN: 250.0000 mL | INTRAVENOUS | Status: DC | PRN

## 2016-01-01 MED ORDER — RIVAROXABAN 20 MG PO TABS
20.0000 mg | ORAL_TABLET | Freq: Every day | ORAL | Status: DC
  Administered 2016-01-02 – 2016-01-11 (×10): 20 mg via ORAL
  Filled 2016-01-01 (×10): qty 1

## 2016-01-01 MED ORDER — CARVEDILOL 3.125 MG PO TABS
3.1250 mg | ORAL_TABLET | Freq: Two times a day (BID) | ORAL | Status: DC
  Administered 2016-01-02 – 2016-01-03 (×4): 3.125 mg via ORAL
  Filled 2016-01-01 (×4): qty 1

## 2016-01-01 MED ORDER — FERROUS SULFATE 325 (65 FE) MG PO TABS
325.0000 mg | ORAL_TABLET | Freq: Three times a day (TID) | ORAL | Status: DC
  Administered 2016-01-02 – 2016-01-03 (×6): 325 mg via ORAL
  Filled 2016-01-01 (×6): qty 1

## 2016-01-01 NOTE — ED Provider Notes (Signed)
[facility]-EM[facility] DEPT Provider Note   CSN: [id number] Arrival date & time: 01/01/16  1454     History   Chief Complaint Chief Complaint  Patient presents with  . Shortness of Breath    HPI Jacob [name] is a 60 y.o. male.  The history is provided by the patient. No language interpreter was used.  Shortness of Breath  This is a new problem. The problem occurs rarely.Associated symptoms include chest pain. It is unknown what precipitated the problem. He has tried nothing for the symptoms. The treatment provided no relief. He has had prior hospitalizations. Associated medical issues include heart failure.  Pt reports he feels short of breath.  Pt reports he feels like he has worsening of his heart failure.  Pt reports he has been taking his medications.  Pt reports he has chest pain currently however he reports he has chest pain daily.    Past Medical History:  Diagnosis Date  . Atrial fibrillation (HCC)    RVR 10/2014  . Automatic implantable cardioverter-defibrillator in situ   . CAD (coronary artery disease) Sept 2013   s/p cardiac cath showing occlusion of small RCA with collaterals  . CHF (congestive heart failure) (HCC)    20 to 25 % EF and RV dysfunction by 07/2014 echo   . Chronic anticoagulation    on xarelto.   . High cholesterol   . Hypertension   . Myocardial infarction 2014  . Noncompliance    homelessness contributing.   . Peripheral arterial disease (HCC)   . Type II diabetes mellitus [facility])     Patient Active Problem List   Diagnosis Date Noted  . Diabetic neuropathy (HCC) 11/28/2015  . Ascites 11/09/2015  . AKI (acute kidney injury) (HCC) 11/09/2015  . CHF (congestive heart failure) (HCC) 11/09/2015  . Insomnia 04/18/2015  . Claudication of right lower extremity (HCC) 04/07/2015  . Cardiomyopathy, ischemic 04/07/2015  . Chronic anticoagulation 03/30/2015  . Cardiorenal syndrome with renal failure   . Morbid obesity due to excess calories (HCC)   .  Helicobacter pylori ab+ 12/12/2014  . Calf pain   . Duodenal ulcer with hemorrhage   . Acute blood loss anemia   . Hemorrhagic shock   . Cardiogenic shock (HCC)   . Acute on chronic combined systolic and diastolic CHF (congestive heart failure) (HCC) 11/23/2014  . Acute on chronic systolic CHF (congestive heart failure), NYHA class 3 (HCC)   . Coronary artery disease involving native coronary artery of native heart without angina pectoris   . Atrial fibrillation with rapid ventricular response (HCC) 10/29/2014  . Chest pain   . Atherosclerosis of native arteries of extremity with intermittent claudication (HCC) 09/06/2014  . Atrial fibrillation with controlled ventricular response (HCC) 08/03/2014  . Acute respiratory failure (HCC) 08/03/2014  . Bacteremia   . DM (diabetes mellitus), type 2 with peripheral vascular complications (HCC) 08/01/2014  . Anemia of chronic disease 08/01/2014  . PVD (peripheral vascular disease) (HCC) 10/29/2013  . Tobacco abuse 05/13/2013  . AF (atrial fibrillation) (HCC) 02/11/2013  . ICD - in place- BS May 2014 [facility] 02/11/2013  . Microcytic anemia 01/11/2013  . CAD- s/p CABG July 2014 [facility] 12/04/2011  . Noncompliance 12/01/2011  . Essential hypertension 10/30/2006    Past Surgical History:  Procedure Laterality Date  . CARDIAC CATHETERIZATION  09/2012  . CORONARY ANGIOPLASTY WITH STENT PLACEMENT  11/2011   "1"  . CORONARY ARTERY BYPASS GRAFT  09/2012   2 vessels per patient [name]   .  ESOPHAGOGASTRODUODENOSCOPY N/A 11/28/2014   Procedure: ESOPHAGOGASTRODUODENOSCOPY (EGD);  Surgeon: Beverley FiedlerJay M Pyrtle, MD;  [facility];  Service: Endoscopy;  Laterality: N/A;  . ILIAC ARTERY STENT Right 08/30/2013  . IMPLANTABLE CARDIOVERTER DEFIBRILLATOR IMPLANT     [place] in 07/2012; [employer]  . LEFT AND RIGHT HEART CATHETERIZATION WITH CORONARY ANGIOGRAM N/A 12/02/2011   Procedure: LEFT AND RIGHT HEART CATHETERIZATION WITH CORONARY ANGIOGRAM;   Surgeon: Kathleene Hazelhristopher D McAlhany, MD;  [facility];  Service: Cardiovascular;  Laterality: N/A;  . LOWER EXTREMITY ANGIOGRAM N/A 08/30/2013   Procedure: LOWER EXTREMITY ANGIOGRAM;  Surgeon: Runell GessJonathan J Berry, MD;  [facility];  Service: Cardiovascular;  Laterality: N/A;  . LOWER EXTREMITY ANGIOGRAM N/A 12/02/2013   Procedure: LOWER EXTREMITY ANGIOGRAM;  Surgeon: Runell GessJonathan J Berry, MD;  [facility];  Service: Cardiovascular;  Laterality: N/A;       Home Medications    Prior to Admission medications   Medication Sig Start Date End Date Taking? Authorizing Provider  ACCU-CHEK SOFTCLIX LANCETS lancets Use for once daily testing of blood sugar 11/28/15  Yes Jaclyn ShaggyEnobong Amao, MD  albuterol (PROVENTIL HFA;VENTOLIN HFA) 108 (90 Base) MCG/ACT inhaler Inhale 2 puffs into the lungs every 6 (six) hours as needed for wheezing or shortness of breath. 03/29/15  Yes Jaclyn ShaggyEnobong Amao, MD  albuterol (PROVENTIL) (2.5 MG/3ML) 0.083% nebulizer solution Take 3 mLs (2.5 mg total) by nebulization every 6 (six) hours as needed for wheezing or shortness of breath. 04/18/15  Yes Jaclyn ShaggyEnobong Amao, MD  amiodarone (PACERONE) 200 MG tablet Take 1 tablet (200 mg total) by mouth 2 (two) times daily. 09/18/15  Yes Amy D Clegg, NP  atorvastatin (LIPITOR) 40 MG tablet Take 1 tablet (40 mg total) by mouth daily at 6 PM. 09/18/15  Yes Amy D Clegg, NP  Blood Glucose Monitoring Suppl (ACCU-CHEK AVIVA) device Use as instructed1 times daily before meals 11/28/15  Yes Jaclyn ShaggyEnobong Amao, MD  carvedilol (COREG) 3.125 MG tablet Take 1 tablet (3.125 mg total) by mouth 2 (two) times daily with a meal. 09/18/15  Yes Amy D Clegg, NP  ferrous sulfate 325 (65 FE) MG EC tablet Take 325 mg by mouth 3 (three) times daily with meals.    Yes Historical Provider, MD  Fluticasone-Salmeterol (ADVAIR) 100-50 MCG/DOSE AEPB Inhale 1 puff into the lungs daily.   Yes Historical Provider, MD  gabapentin (NEURONTIN) 300 MG capsule Take 1 capsule (300 mg total)  by mouth 2 (two) times daily. 11/28/15  Yes Jaclyn ShaggyEnobong Amao, MD  glipiZIDE (GLUCOTROL) 10 MG tablet Take 1 tablet (10 mg total) by mouth 2 (two) times daily before a meal. 04/04/15  Yes Jaclyn ShaggyEnobong Amao, MD  glucose blood (ACCU-CHEK AVIVA) test strip Use as instructed for 1 times daily testing of blood sugar 11/28/15  Yes Jaclyn ShaggyEnobong Amao, MD  rivaroxaban (XARELTO) 20 MG TABS tablet Take 1 tablet (20 mg total) by mouth daily before supper. 09/18/15  Yes Amy D Clegg, NP  spironolactone (ALDACTONE) 25 MG tablet Take 1 tablet (25 mg total) by mouth daily. 09/18/15  Yes Amy D Clegg, NP  torsemide (DEMADEX) 20 MG tablet Take 4 tablets (80 mg total) by mouth 2 (two) times daily. 11/14/15  Yes Ripudeep Jenna LuoK Rai, MD  acetaminophen-codeine (TYLENOL #3) 300-30 MG tablet Take 1 tablet by mouth every 12 (twelve) hours as needed for moderate pain. Patient not taking: Reported on 01/01/2016 11/28/15   Jaclyn ShaggyEnobong Amao, MD  digoxin (LANOXIN) 0.125 MG tablet Take 0.5 tablets (0.0625 mg total) by mouth  daily. Patient not taking: Reported on 01/01/2016 09/18/15   Amy D Clegg, NP  isosorbide-hydrALAZINE (BIDIL) 20-37.5 MG tablet Take 1 tablet by mouth 2 (two) times daily. Patient not taking: Reported on 01/01/2016 11/28/15   Jaclyn Shaggy, MD  [facility]) lancets Use as instructed for once daily testing of blood sugar Patient not taking: Reported on 01/01/2016 11/28/15   Jaclyn Shaggy, MD    Family History Family History  Problem Relation Age of Onset  . Diabetes Mother     Social History Social History  Substance Use Topics  . Smoking status: Current Every Day Smoker    Packs/day: 0.50    Years: 40.00    Types: Cigarettes  . Smokeless tobacco: Never Used  . Alcohol use No     Allergies   Tape   Review of Systems Review of Systems  Respiratory: Positive for shortness of breath.   Cardiovascular: Positive for chest pain.  All other systems reviewed and are negative.    Physical Exam Updated Vital  Signs BP 129/73   Pulse 109   Temp 97.8 F (36.6 C) (Oral)   Resp 24   Ht 5\' 9"  (1.753 m)   Wt 99.8 kg   SpO2 97%   BMI 32.49 kg/m   Physical Exam  Constitutional: He appears well-developed and well-nourished.  HENT:  Head: Normocephalic and atraumatic.  Eyes: Conjunctivae are normal.  Neck: Neck supple.  Cardiovascular: Normal rate and regular rhythm.   No murmur heard. Pulmonary/Chest: Effort normal and breath sounds normal. No respiratory distress. He exhibits tenderness.  Abdominal: Soft. He exhibits no distension.  Musculoskeletal: He exhibits no edema.  Neurological: He is alert.  Skin: Skin is warm and dry.  Psychiatric: He has a normal mood and affect.  Nursing note and vitals reviewed.    ED Treatments / Results  Labs (all labs ordered are listed, but only abnormal results are displayed) Labs Reviewed  CBC WITH DIFFERENTIAL/PLATELET - Abnormal; Notable for the following:       Result Value   Hemoglobin 12.5 (*)    MCH 25.6 (*)    RDW 19.1 (*)    All other components within normal limits  COMPREHENSIVE METABOLIC PANEL - Abnormal; Notable for the following:    Sodium 133 (*)    Potassium 5.7 (*)    Chloride 97 (*)    Glucose, Bld 161 (*)    BUN 35 (*)    Creatinine, Ser 1.61 (*)    Calcium 8.3 (*)    Albumin 3.4 (*)    AST 49 (*)    ALT 16 (*)    Alkaline Phosphatase 160 (*)    Total Bilirubin 2.2 (*)    GFR calc non Af Amer 46 (*)    GFR calc Af Amer 53 (*)    All other components within normal limits  BRAIN NATRIURETIC PEPTIDE - Abnormal; Notable for the following:    B Natriuretic Peptide 568.6 (*)    All other components within normal limits  TROPONIN I - Abnormal; Notable for the following:    Troponin I 0.05 (*)    All other components within normal limits    EKG  EKG Interpretation  Date/Time:  Monday January 01 2016 15:21:08 EDT Ventricular Rate:  114 PR Interval:    QRS Duration: 115 QT Interval:  418 QTC Calculation: 576 R  Axis:   -92 Text Interpretation:  Sinus tachycardia Ventricular premature complex Left anterior fascicular block Borderline low voltage, extremity leads  Nonspecific T abnormalities, lateral leads since previous tracing, patient in sinus rhythm now  no acute ischemic changes Confirmed by LITTLE MD, RACHEL [phone]) on 01/01/2016 3:38:15 PM       Radiology Dg Chest 2 View  Result Date: 01/01/2016 CLINICAL DATA:  Chest pain, shortness of breath. EXAM: CHEST  2 VIEW COMPARISON:  Radiographs of November 08, 2015. FINDINGS: Stable cardiomediastinal silhouette. Status post coronary bypass graft. Left-sided pacemaker is unchanged in position. Central pulmonary vascular congestion is noted with bilateral diffuse interstitial densities concerning for pulmonary edema. No pneumothorax is noted. Mild right pleural effusion is noted. Bony thorax is unremarkable. IMPRESSION: Central pulmonary vascular congestion with bilateral pulmonary edema is noted. Mild right pleural effusion is noted. Electronically Signed   By: Lupita Raider, M.D.   On: 01/01/2016 16:51    Procedures Procedures (including critical care time)  Medications Ordered in ED Medications  ipratropium-albuterol (DUONEB) 0.5-2.5 (3) MG/3ML nebulizer solution 3 mL (3 mLs Nebulization Given 01/01/16 1549)  furosemide (LASIX) injection 80 mg (80 mg Intravenous Given 01/01/16 1740)     Initial Impression / Assessment and Plan / ED Course  I have reviewed the triage vital signs and the nursing notes.  Pertinent labs & imaging results that were available during my care of the patient were reviewed by me and considered in my medical decision making (see chart for details).  Clinical Course  Pt given Iv lasix.  Labs returned.  BNP is 568. Troponin is elevated at 0.05.   Pt has elevated bun and creatinine sodium.  Sodium is decreased  Potasium is elevated.   Final Clinical Impressions(s) / ED Diagnoses   Final diagnoses:  SOB (shortness of breath)   Acute pulmonary edema (HCC)    New Prescriptions New Prescriptions   No medications on file   Unassigned medicine consult for admission   Elson Areas, PA-C 01/01/16 1900    Laurence Spates, MD 01/03/16 [phone]

## 2016-01-01 NOTE — ED Triage Notes (Signed)
Pt in from home with c/o sob since waking this morning. Pt took albuterol inhaler pta. Rales present in lower lobes per EMS. Pt has hx of CHF, takes Lasix and Torsemide per EMS. VS 109/76, 120HR, 97%RA.

## 2016-01-01 NOTE — ED Notes (Signed)
Pt transported to xray 

## 2016-01-01 NOTE — H&P (Signed)
History and Physical    Jacob [name]:[id number] DOB: 01-04-1956 DOA: 01/01/2016  Referring MD/NP/PA: Trisha Mangle PA-C PCP: [name], MD  Patient coming from: Home  Chief Complaint:  Shortness of breathe  HPI: Jacob [name] is a 60 y.o. 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 He complains of substernal chest pain that he reports is similar to previous pain.  Associated symptoms include weight gain of 12 pounds, swelling of the legs/abdomen, PND, dyspnea on exertion, sleeps with 2-3 pillows, and orthopnea. Patient reports that he takes his medications as prescribed. However, patient then states that he has been out of digoxin and Bidil for at least 2 weeks now.  Patient's followed as an outpatient setting by Dr. Gala Romney. Last hospitalization was 8/16- 8/22 at [facility] with similar symptoms, and reported to be out of medications.  ED Co is urse: Upon admission into the emergency department patient was evaluated and found to be afebrile, HR from 104-117, respirations 19-31, and all other vitals maintained. Lab work reveals hemoglobin 12.5, sodium 133, potassium 5.7, chloride 97, BUN 26, creatinine 1.61, calcium 8.3, troponin 0.05, glucose 161, troponin 0.05, BNP 568.6. Chest x-ray shows central pulmonary vascular congestion with bilateral pulmonary edema. Patient was given 80 mg of Lasix IV in the ED. TRH called to admit.   Review of Systems: As per HPI otherwise 10 point review of systems negative.   Past Medical History:  Diagnosis Date  . Atrial fibrillation (HCC)    RVR 10/2014  . Automatic implantable cardioverter-defibrillator in situ   . CAD (coronary artery disease) Sept 2013   s/p cardiac cath showing occlusion of small RCA with collaterals  . CHF (congestive heart failure) (HCC)    20 to 25 %  EF and RV dysfunction by 07/2014 echo   . Chronic anticoagulation    on xarelto.   . High cholesterol   . Hypertension   . Myocardial infarction 2014  . Noncompliance    homelessness contributing.   . Peripheral arterial disease (HCC)   . Type II diabetes mellitus (HCC)     Past Surgical History:  Procedure Laterality Date  . CARDIAC CATHETERIZATION  09/2012  . CORONARY ANGIOPLASTY WITH STENT PLACEMENT  11/2011   "1"  . CORONARY ARTERY BYPASS GRAFT  09/2012   2 vessels per patient [name]   . ESOPHAGOGASTRODUODENOSCOPY N/A 11/28/2014   Procedure: ESOPHAGOGASTRODUODENOSCOPY (EGD);  Surgeon: Beverley Fiedler, MD;  Location: [facility] ENDOSCOPY;  Service: Endoscopy;  Laterality: N/A;  . ILIAC ARTERY STENT Right 08/30/2013  . IMPLANTABLE CARDIOVERTER DEFIBRILLATOR IMPLANT     [place] in 07/2012; [employer]  . LEFT AND RIGHT HEART CATHETERIZATION WITH CORONARY ANGIOGRAM N/A 12/02/2011   Procedure: LEFT AND RIGHT HEART CATHETERIZATION WITH CORONARY ANGIOGRAM;  Surgeon: Kathleene Hazel, MD;  Location: [facility] CATH LAB;  Service: Cardiovascular;  Laterality: N/A;  . LOWER EXTREMITY ANGIOGRAM N/A 08/30/2013   Procedure: LOWER EXTREMITY ANGIOGRAM;  Surgeon: Runell Gess, MD;  Location: [facility] CATH LAB;  Service: Cardiovascular;  Laterality: N/A;  . LOWER EXTREMITY ANGIOGRAM N/A 12/02/2013   Procedure: LOWER EXTREMITY ANGIOGRAM;  Surgeon: Runell Gess, MD;  Location: [facility] CATH LAB;  Service: Cardiovascular;  Laterality: N/A;     reports that he has been smoking Cigarettes.  He has a 20.00 pack-year smoking history. He has never  used smokeless tobacco. He reports that he does not drink alcohol or use drugs.  Allergies  Allergen Reactions  . Tape Itching    Paper tape please.    Family History  Problem Relation Age of Onset  . Diabetes Mother     Prior to Admission medications   Medication Sig Start Date End Date Taking? Authorizing Provider  ACCU-CHEK SOFTCLIX LANCETS lancets Use for once daily  testing of blood sugar 11/28/15  Yes Jaclyn Shaggy, MD  albuterol (PROVENTIL HFA;VENTOLIN HFA) 108 (90 Base) MCG/ACT inhaler Inhale 2 puffs into the lungs every 6 (six) hours as needed for wheezing or shortness of breath. 03/29/15  Yes Jaclyn Shaggy, MD  albuterol (PROVENTIL) (2.5 MG/3ML) 0.083% nebulizer solution Take 3 mLs (2.5 mg total) by nebulization every 6 (six) hours as needed for wheezing or shortness of breath. 04/18/15  Yes Jaclyn Shaggy, MD  amiodarone (PACERONE) 200 MG tablet Take 1 tablet (200 mg total) by mouth 2 (two) times daily. 09/18/15  Yes Amy D Clegg, NP  atorvastatin (LIPITOR) 40 MG tablet Take 1 tablet (40 mg total) by mouth daily at 6 PM. 09/18/15  Yes Amy D Clegg, NP  Blood Glucose Monitoring Suppl (ACCU-CHEK AVIVA) device Use as instructed1 times daily before meals 11/28/15  Yes Jaclyn Shaggy, MD  carvedilol (COREG) 3.125 MG tablet Take 1 tablet (3.125 mg total) by mouth 2 (two) times daily with a meal. 09/18/15  Yes Amy D Clegg, NP  ferrous sulfate 325 (65 FE) MG EC tablet Take 325 mg by mouth 3 (three) times daily with meals.    Yes Historical Provider, MD  Fluticasone-Salmeterol (ADVAIR) 100-50 MCG/DOSE AEPB Inhale 1 puff into the lungs daily.   Yes Historical Provider, MD  gabapentin (NEURONTIN) 300 MG capsule Take 1 capsule (300 mg total) by mouth 2 (two) times daily. 11/28/15  Yes Jaclyn Shaggy, MD  glipiZIDE (GLUCOTROL) 10 MG tablet Take 1 tablet (10 mg total) by mouth 2 (two) times daily before a meal. 04/04/15  Yes Jaclyn Shaggy, MD  glucose blood (ACCU-CHEK AVIVA) test strip Use as instructed for 1 times daily testing of blood sugar 11/28/15  Yes Jaclyn Shaggy, MD  rivaroxaban (XARELTO) 20 MG TABS tablet Take 1 tablet (20 mg total) by mouth daily before supper. 09/18/15  Yes Amy D Clegg, NP  spironolactone (ALDACTONE) 25 MG tablet Take 1 tablet (25 mg total) by mouth daily. 09/18/15  Yes Amy D Clegg, NP  torsemide (DEMADEX) 20 MG tablet Take 4 tablets (80 mg total) by mouth 2 (two) times  daily. 11/14/15  Yes Ripudeep Jenna Luo, MD  acetaminophen-codeine (TYLENOL #3) 300-30 MG tablet Take 1 tablet by mouth every 12 (twelve) hours as needed for moderate pain. Patient not taking: Reported on 01/01/2016 11/28/15   Jaclyn Shaggy, MD  digoxin (LANOXIN) 0.125 MG tablet Take 0.5 tablets (0.0625 mg total) by mouth daily. Patient not taking: Reported on 01/01/2016 09/18/15   Amy D Clegg, NP  isosorbide-hydrALAZINE (BIDIL) 20-37.5 MG tablet Take 1 tablet by mouth 2 (two) times daily. Patient not taking: Reported on 01/01/2016 11/28/15   Jaclyn Shaggy, MD  Lancet Devices [facility]) lancets Use as instructed for once daily testing of blood sugar Patient not taking: Reported on 01/01/2016 11/28/15   Jaclyn Shaggy, MD    Physical Exam:    Constitutional:Obese male, in mild respiratory distress, and able to follow commands  Vitals:   01/01/16 1545 01/01/16 1600 01/01/16 1700 01/01/16 1812  BP: 111/79 115/81 101/69 129/73  Pulse: 116  117 105 109  Resp: 24 19 25 24   Temp:      TempSrc:      SpO2: 95% 97% 97% 97%  Weight:      Height:       Eyes: PERRL, lids and conjunctivae normal ENMT: Mucous membranes are moist. Posterior pharynx clear of any exudate or lesions. Poor dentition.  Neck: normal, supple, no masses, no thyromegaly. +JVD Respiratory:b/l crackles in both lung fields with decreased overall respiratory. Tachypneic, No accessory muscle use.  Cardiovascular: Tachycardic, no murmurs / rubs / gallops. 2+ pitting edema to the mid knee. 2+ pedal pulses. No carotid bruits.  Abdomen:  Positive fluid wave, no abdominal tenderness, no masses palpated. No hepatosplenomegaly. Bowel sounds positive.  Musculoskeletal:  No joint deformity upper and lower extremities. Good ROM, no contractures. Normal muscle tone.  Skin: no rashes, lesions, ulcers. No induration Neurologic: CN 2-12 grossly intact. Sensation intact, DTR normal. Strength 5/5 in all 4.  Psychiatric: Normal judgment and insight.  Alert and oriented x 3. Normal mood.     Labs on Admission: I have personally reviewed following labs and imaging studies  CBC:  Recent Labs Lab 01/01/16 1554  WBC 7.4  NEUTROABS 5.3  HGB 12.5*  HCT 41.1  MCV 84.2  PLT 259   Basic Metabolic Panel:  Recent Labs Lab 01/01/16 1554  NA 133*  K 5.7*  CL 97*  CO2 26  GLUCOSE 161*  BUN 35*  CREATININE 1.61*  CALCIUM 8.3*   GFR: Estimated Creatinine Clearance: 58.2 mL/min (by C-G formula based on SCr of 1.61 mg/dL (H)). Liver Function Tests:  Recent Labs Lab 01/01/16 1554  AST 49*  ALT 16*  ALKPHOS 160*  BILITOT 2.2*  PROT 7.2  ALBUMIN 3.4*   No results for input(s): LIPASE, AMYLASE in the last 168 hours. No results for input(s): AMMONIA in the last 168 hours. Coagulation Profile: No results for input(s): INR, PROTIME in the last 168 hours. Cardiac Enzymes:  Recent Labs Lab 01/01/16 1554  TROPONINI 0.05*   BNP (last 3 results) No results for input(s): PROBNP in the last 8760 hours. HbA1C: No results for input(s): HGBA1C in the last 72 hours. CBG: No results for input(s): GLUCAP in the last 168 hours. Lipid Profile: No results for input(s): CHOL, HDL, LDLCALC, TRIG, CHOLHDL, LDLDIRECT in the last 72 hours. Thyroid Function Tests: No results for input(s): TSH, T4TOTAL, FREET4, T3FREE, THYROIDAB in the last 72 hours. Anemia Panel: No results for input(s): VITAMINB12, FOLATE, FERRITIN, TIBC, IRON, RETICCTPCT in the last 72 hours. Urine analysis:    Component Value Date/Time   COLORURINE YELLOW 12/01/2014 1300   APPEARANCEUR CLEAR 12/01/2014 1300   LABSPEC 1.009 12/01/2014 1300   PHURINE 5.5 12/01/2014 1300   GLUCOSEU NEGATIVE 12/01/2014 1300   HGBUR NEGATIVE 12/01/2014 1300   BILIRUBINUR NEGATIVE 12/01/2014 1300   KETONESUR NEGATIVE 12/01/2014 1300   PROTEINUR NEGATIVE 12/01/2014 1300   UROBILINOGEN 1.0 12/01/2014 1300   NITRITE NEGATIVE 12/01/2014 1300   LEUKOCYTESUR NEGATIVE 12/01/2014 1300    Sepsis Labs: No results found for this or any previous visit (from the past 240 hour(s)).   Radiological Exams on Admission: Dg Chest 2 View  Result Date: 01/01/2016 CLINICAL DATA:  Chest pain, shortness of breath. EXAM: CHEST  2 VIEW COMPARISON:  Radiographs of November 08, 2015. FINDINGS: Stable cardiomediastinal silhouette. Status post coronary bypass graft. Left-sided pacemaker is unchanged in position. Central pulmonary vascular congestion is noted with bilateral diffuse interstitial densities concerning for pulmonary edema. No  pneumothorax is noted. Mild right pleural effusion is noted. Bony thorax is unremarkable. IMPRESSION: Central pulmonary vascular congestion with bilateral pulmonary edema is noted. Mild right pleural effusion is noted. Electronically Signed   By: Lupita RaiderJames  Green Jr, M.D.   On: 01/01/2016 16:51    EKG: Independently reviewed. Sinus tachycardia with low voltage and left anterior fascicular block prolonged QTC  Assessment/Plan Acute on chronic combined systolic and diastolic CHF: Patient presents with an estimated weight gain of 12 pounds in the last week. Last EF 25-30% in 10/2015 with diffuse hypokinesis. - Admit to a telemetry bed  - Strict I&Os and daily weights  - Hold torsemide and continue Lasix IV 100 mg q 12 hrs - Continue Coreg and spironolactone - Will need to consult heart failure team in a.m. - Care management for assistance with medications  Atrial fibrillation on chronic anticoagulation therapy - continue to Xarelto, Amiodarone - added back on digoxin  Elevated troponin level/chest pain - Trend troponins - Add back on Bidil, but consider issues with patient obtaining this medication  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 - Continue to monitor   Diabetes mellitus type 2 with peripheral vascular compensation - Hypoglycemic protocols - Held glipizide - Continue Gabapentin  Copd, without acute exacerbation - Changed Advair inhaler to  nebulized budesonide and Brovana - Duonebs q 4hrs prn Sob/wheezing  Essential hypertension - continue current medication regimen  Chronic kidney disease stage III: Creatinine has ranged from 1.14- 1.99 over the last few months. Patient presents with a creatinine of 1.63 and a BUN of 35. - Recheck CMP in am  Elevated transaminases: Elevated AST and AP. Suspect secondary to passive congestion from CHF exacerbation. - Continue to monitor   Tobacco abuse - Nicotine patch - Counseled on the need of cessation of tobacco use  DVT prophylaxis: Xarelto Code Status: Full  Family Communication: No family present at bedside  Disposition Plan: Discharge home once stable Consults called: None  Admission status: telemetry obs  Clydie Braunondell A Smith MD [facility] Pager [phone]  If 7PM-7AM, please contact night-coverage [email or web address] Password [email or web address]  01/01/2016, 7:08 PM

## 2016-01-01 NOTE — ED Notes (Signed)
Placed pt on 2L O2 via nasal cannula d/t SpO2 intermittently decreasing to 88% RA. PA notified.

## 2016-01-01 NOTE — ED Notes (Signed)
Pt refused to continue lying in bed, asking to sit in chair instead because it helps him breathe. Placed chair next to stretcher for patient. In NAD at this time.

## 2016-01-02 ENCOUNTER — Encounter (HOSPITAL_COMMUNITY): Payer: Self-pay | Admitting: Nurse Practitioner

## 2016-01-02 DIAGNOSIS — Z720 Tobacco use: Secondary | ICD-10-CM | POA: Diagnosis not present

## 2016-01-02 DIAGNOSIS — N179 Acute kidney failure, unspecified: Secondary | ICD-10-CM

## 2016-01-02 DIAGNOSIS — I5043 Acute on chronic combined systolic (congestive) and diastolic (congestive) heart failure: Secondary | ICD-10-CM

## 2016-01-02 DIAGNOSIS — Z452 Encounter for adjustment and management of vascular access device: Secondary | ICD-10-CM | POA: Diagnosis not present

## 2016-01-02 DIAGNOSIS — R0602 Shortness of breath: Secondary | ICD-10-CM | POA: Diagnosis not present

## 2016-01-02 DIAGNOSIS — N183 Chronic kidney disease, stage 3 (moderate): Secondary | ICD-10-CM | POA: Diagnosis present

## 2016-01-02 DIAGNOSIS — R358 Other polyuria: Secondary | ICD-10-CM | POA: Diagnosis not present

## 2016-01-02 DIAGNOSIS — I252 Old myocardial infarction: Secondary | ICD-10-CM | POA: Diagnosis not present

## 2016-01-02 DIAGNOSIS — T502X5A Adverse effect of carbonic-anhydrase inhibitors, benzothiadiazides and other diuretics, initial encounter: Secondary | ICD-10-CM | POA: Diagnosis not present

## 2016-01-02 DIAGNOSIS — Z7901 Long term (current) use of anticoagulants: Secondary | ICD-10-CM | POA: Diagnosis not present

## 2016-01-02 DIAGNOSIS — I255 Ischemic cardiomyopathy: Secondary | ICD-10-CM | POA: Diagnosis present

## 2016-01-02 DIAGNOSIS — I248 Other forms of acute ischemic heart disease: Secondary | ICD-10-CM | POA: Diagnosis present

## 2016-01-02 DIAGNOSIS — R188 Other ascites: Secondary | ICD-10-CM | POA: Diagnosis present

## 2016-01-02 DIAGNOSIS — I482 Chronic atrial fibrillation: Secondary | ICD-10-CM | POA: Diagnosis not present

## 2016-01-02 DIAGNOSIS — F17210 Nicotine dependence, cigarettes, uncomplicated: Secondary | ICD-10-CM | POA: Diagnosis present

## 2016-01-02 DIAGNOSIS — E1122 Type 2 diabetes mellitus with diabetic chronic kidney disease: Secondary | ICD-10-CM | POA: Diagnosis present

## 2016-01-02 DIAGNOSIS — E1140 Type 2 diabetes mellitus with diabetic neuropathy, unspecified: Secondary | ICD-10-CM | POA: Diagnosis present

## 2016-01-02 DIAGNOSIS — I481 Persistent atrial fibrillation: Secondary | ICD-10-CM | POA: Diagnosis not present

## 2016-01-02 DIAGNOSIS — R740 Nonspecific elevation of levels of transaminase and lactic acid dehydrogenase [LDH]: Secondary | ICD-10-CM | POA: Diagnosis present

## 2016-01-02 DIAGNOSIS — G4700 Insomnia, unspecified: Secondary | ICD-10-CM | POA: Diagnosis present

## 2016-01-02 DIAGNOSIS — I2510 Atherosclerotic heart disease of native coronary artery without angina pectoris: Secondary | ICD-10-CM | POA: Diagnosis present

## 2016-01-02 DIAGNOSIS — N178 Other acute kidney failure: Secondary | ICD-10-CM | POA: Diagnosis not present

## 2016-01-02 DIAGNOSIS — I509 Heart failure, unspecified: Secondary | ICD-10-CM | POA: Diagnosis not present

## 2016-01-02 DIAGNOSIS — I484 Atypical atrial flutter: Secondary | ICD-10-CM | POA: Diagnosis not present

## 2016-01-02 DIAGNOSIS — I10 Essential (primary) hypertension: Secondary | ICD-10-CM | POA: Diagnosis not present

## 2016-01-02 DIAGNOSIS — J449 Chronic obstructive pulmonary disease, unspecified: Secondary | ICD-10-CM | POA: Diagnosis present

## 2016-01-02 DIAGNOSIS — I130 Hypertensive heart and chronic kidney disease with heart failure and stage 1 through stage 4 chronic kidney disease, or unspecified chronic kidney disease: Secondary | ICD-10-CM | POA: Diagnosis present

## 2016-01-02 DIAGNOSIS — T463X5A Adverse effect of coronary vasodilators, initial encounter: Secondary | ICD-10-CM | POA: Diagnosis not present

## 2016-01-02 DIAGNOSIS — E871 Hypo-osmolality and hyponatremia: Secondary | ICD-10-CM | POA: Diagnosis present

## 2016-01-02 DIAGNOSIS — E1151 Type 2 diabetes mellitus with diabetic peripheral angiopathy without gangrene: Secondary | ICD-10-CM | POA: Diagnosis not present

## 2016-01-02 DIAGNOSIS — J810 Acute pulmonary edema: Secondary | ICD-10-CM | POA: Diagnosis not present

## 2016-01-02 DIAGNOSIS — Z951 Presence of aortocoronary bypass graft: Secondary | ICD-10-CM | POA: Diagnosis not present

## 2016-01-02 LAB — GLUCOSE, CAPILLARY
GLUCOSE-CAPILLARY: 103 mg/dL — AB (ref 65–99)
GLUCOSE-CAPILLARY: 198 mg/dL — AB (ref 65–99)
Glucose-Capillary: 249 mg/dL — ABNORMAL HIGH (ref 65–99)
Glucose-Capillary: 173 mg/dL — ABNORMAL HIGH (ref 65–99)

## 2016-01-02 LAB — CBC WITH DIFFERENTIAL/PLATELET
BASOS PCT: 1 %
Basophils Absolute: 0.1 10*3/uL (ref 0.0–0.1)
EOS ABS: 0.1 10*3/uL (ref 0.0–0.7)
EOS PCT: 1 %
HCT: 43.3 % (ref 39.0–52.0)
HEMOGLOBIN: 12.3 g/dL — AB (ref 13.0–17.0)
Lymphocytes Relative: 14 %
Lymphs Abs: 1.2 10*3/uL (ref 0.7–4.0)
MCH: 24.2 pg — AB (ref 26.0–34.0)
MCHC: 28.4 g/dL — ABNORMAL LOW (ref 30.0–36.0)
MCV: 85.2 fL (ref 78.0–100.0)
MONOS PCT: 9 %
Monocytes Absolute: 0.8 10*3/uL (ref 0.1–1.0)
NEUTROS PCT: 75 %
Neutro Abs: 6.4 10*3/uL (ref 1.7–7.7)
PLATELETS: 234 10*3/uL (ref 150–400)
RBC: 5.08 MIL/uL (ref 4.22–5.81)
RDW: 19 % — ABNORMAL HIGH (ref 11.5–15.5)
WBC: 8.5 10*3/uL (ref 4.0–10.5)

## 2016-01-02 LAB — COMPREHENSIVE METABOLIC PANEL
ALK PHOS: 148 U/L — AB (ref 38–126)
ALT: 11 U/L — AB (ref 17–63)
AST: 17 U/L (ref 15–41)
Albumin: 3.3 g/dL — ABNORMAL LOW (ref 3.5–5.0)
Anion gap: 12 (ref 5–15)
BILIRUBIN TOTAL: 1.7 mg/dL — AB (ref 0.3–1.2)
BUN: 36 mg/dL — AB (ref 6–20)
CO2: 27 mmol/L (ref 22–32)
CREATININE: 1.83 mg/dL — AB (ref 0.61–1.24)
Calcium: 8.6 mg/dL — ABNORMAL LOW (ref 8.9–10.3)
Chloride: 96 mmol/L — ABNORMAL LOW (ref 101–111)
GFR calc Af Amer: 45 mL/min — ABNORMAL LOW (ref >60)
GFR, EST NON AFRICAN AMERICAN: 39 mL/min — AB (ref >60)
Glucose, Bld: 197 mg/dL — ABNORMAL HIGH (ref 65–99)
Potassium: 4 mmol/L (ref 3.5–5.1)
Sodium: 135 mmol/L (ref 135–145)
TOTAL PROTEIN: 7 g/dL (ref 6.5–8.1)

## 2016-01-02 LAB — TROPONIN I
TROPONIN I: 0.04 ng/mL — AB (ref <0.03)
Troponin I: 0.05 ng/mL (ref <0.03)

## 2016-01-02 MED ORDER — METOLAZONE 2.5 MG PO TABS
2.5000 mg | ORAL_TABLET | Freq: Once | ORAL | Status: AC
  Administered 2016-01-02: 2.5 mg via ORAL
  Filled 2016-01-02: qty 1

## 2016-01-02 MED ORDER — INFLUENZA VAC SPLIT QUAD 0.5 ML IM SUSY
0.5000 mL | PREFILLED_SYRINGE | INTRAMUSCULAR | Status: DC
  Filled 2016-01-02: qty 0.5

## 2016-01-02 MED ORDER — MILRINONE LACTATE IN DEXTROSE 20-5 MG/100ML-% IV SOLN
0.2500 ug/kg/min | INTRAVENOUS | Status: DC
  Administered 2016-01-02 – 2016-01-08 (×9): 0.25 ug/kg/min via INTRAVENOUS
  Filled 2016-01-02 (×11): qty 100

## 2016-01-02 NOTE — Discharge Instructions (Signed)

## 2016-01-02 NOTE — Consult Note (Addendum)
Advanced Heart Failure Team Consult Note  Referring Physician:  Primary Physician: Primary Cardiologist:    Reason for Consultation:   HPI:    Jedediah Noda is a 60 y.o. [place] male with history of systolic HF EF 20-25% via Echo 08/03/14 s/p INCEPTA ICD implant 6/14, RV dysfunction, CAD s/p CABG x 2 w/ RF MAZE 7/14 at [facility], DM type II, Chronic afib on xarelto, GI bleed 11/28/2014, and HLD.   Last seen in Hf clinic 08/31/15, has since been non-compliant with follow up. Thought to be stable, though tenuous with NYHA IIIB symptoms.  Weight 201 lbs at that visit.   Since has been non-compliant with follow up and was recently discharged from Paramedicine program due to lack of follow up/medical compliance.  Most recently admitted 8/16 -> 11/14/15. Required high dose IV lasix + milrinone to augment diuresis.  Was out 14.2 L and down 23 lbs that admission. Discharge weight 212 lbs ( and this was due to him being adamant he needed to leave that day.   Presented to [facility] 01/01/16 with worsening SOB and swelling.  Weight up 12 lbs per patient, unclear time frame. Pertinent labs on admission include K 5.7 (improved to 4.0), Creatinine 1.61, BNP 568.6. Troponin flat. CXR with central pulmonary vascular congestion with bilateral pulmonary edema. Mild R pleural effusion.  States he has been out of Bidil and Digoxin for at least "3 weeks". Adamant that he has been taking torsemide.  Drinking > 2 L a day.  Can't say why he runs out of medicine and doesn't call.  Says "my mistake".  Most of his money (fixed income) goes towards his apartment for rent. >$500.   Review of Systems: [y] = yes, [ ]  = no   General: Weight gain [y]; Weight loss [ ] ; Anorexia [ ] ; Fatigue [y]; Fever [ ] ; Chills [ ] ; Weakness [ ]   Cardiac: Chest pain/pressure [ ] ; Resting SOB [ ] ; Exertional SOB [y]; Orthopnea [y]; Pedal Edema [y]; Palpitations [ ] ; Syncope [ ] ; Presyncope [ ] ; Paroxysmal nocturnal dyspnea[ ]   Pulmonary: Cough [ ] ;  Wheezing[ ] ; Hemoptysis[ ] ; Sputum [ ] ; Snoring [ ]   GI: Vomiting[ ] ; Dysphagia[ ] ; Melena[ ] ; Hematochezia [ ] ; Heartburn[ ] ; Abdominal pain [ ] ; Constipation [ ] ; Diarrhea [ ] ; BRBPR [ ]   GU: Hematuria[ ] ; Dysuria [ ] ; Nocturia[ ]   Vascular: Pain in legs with walking [ ] ; Pain in feet with lying flat [ ] ; Non-healing sores [ ] ; Stroke [ ] ; TIA [ ] ; Slurred speech [ ] ;  Neuro: Headaches[ ] ; Vertigo[ ] ; Seizures[ ] ; Paresthesias[ ] ;Blurred vision [ ] ; Diplopia [ ] ; Vision changes [ ]   Ortho/Skin: Arthritis [y]; Joint pain [y]; Muscle pain [ ] ; Joint swelling [ ] ; Back Pain [ ] ; Rash [ ]   Psych: Depression[ ] ; Anxiety[ ]   Heme: Bleeding problems [ ] ; Clotting disorders [ ] ; Anemia [ ]   Endocrine: Diabetes [ ] ; Thyroid dysfunction[ ]   Home Medications Prior to Admission medications   Medication Sig Start Date End Date Taking? Authorizing Provider  ACCU-CHEK SOFTCLIX LANCETS lancets Use for once daily testing of blood sugar 11/28/15  Yes Jaclyn Shaggy, MD  albuterol (PROVENTIL HFA;VENTOLIN HFA) 108 (90 Base) MCG/ACT inhaler Inhale 2 puffs into the lungs every 6 (six) hours as needed for wheezing or shortness of breath. 03/29/15  Yes Jaclyn Shaggy, MD  albuterol (PROVENTIL) (2.5 MG/3ML) 0.083% nebulizer solution Take 3 mLs (2.5 mg total) by nebulization every 6 (six) hours as needed for  wheezing or shortness of breath. 04/18/15  Yes Jaclyn ShaggyEnobong Amao, MD  amiodarone (PACERONE) 200 MG tablet Take 1 tablet (200 mg total) by mouth 2 (two) times daily. 09/18/15  Yes Amy D Clegg, NP  atorvastatin (LIPITOR) 40 MG tablet Take 1 tablet (40 mg total) by mouth daily at 6 PM. 09/18/15  Yes Amy D Clegg, NP  Blood Glucose Monitoring Suppl (ACCU-CHEK AVIVA) device Use as instructed1 times daily before meals 11/28/15  Yes Jaclyn ShaggyEnobong Amao, MD  carvedilol (COREG) 3.125 MG tablet Take 1 tablet (3.125 mg total) by mouth 2 (two) times daily with a meal. 09/18/15  Yes Amy D Clegg, NP  ferrous sulfate 325 (65 FE) MG EC tablet Take 325 mg by  mouth 3 (three) times daily with meals.    Yes Historical Provider, MD  Fluticasone-Salmeterol (ADVAIR) 100-50 MCG/DOSE AEPB Inhale 1 puff into the lungs daily.   Yes Historical Provider, MD  gabapentin (NEURONTIN) 300 MG capsule Take 1 capsule (300 mg total) by mouth 2 (two) times daily. 11/28/15  Yes Jaclyn ShaggyEnobong Amao, MD  glipiZIDE (GLUCOTROL) 10 MG tablet Take 1 tablet (10 mg total) by mouth 2 (two) times daily before a meal. 04/04/15  Yes Jaclyn ShaggyEnobong Amao, MD  glucose blood (ACCU-CHEK AVIVA) test strip Use as instructed for 1 times daily testing of blood sugar 11/28/15  Yes Jaclyn ShaggyEnobong Amao, MD  rivaroxaban (XARELTO) 20 MG TABS tablet Take 1 tablet (20 mg total) by mouth daily before supper. 09/18/15  Yes Amy D Clegg, NP  spironolactone (ALDACTONE) 25 MG tablet Take 1 tablet (25 mg total) by mouth daily. 09/18/15  Yes Amy D Clegg, NP  torsemide (DEMADEX) 20 MG tablet Take 4 tablets (80 mg total) by mouth 2 (two) times daily. 11/14/15  Yes Ripudeep Jenna LuoK Rai, MD  acetaminophen-codeine (TYLENOL #3) 300-30 MG tablet Take 1 tablet by mouth every 12 (twelve) hours as needed for moderate pain. Patient not taking: Reported on 01/01/2016 11/28/15   Jaclyn ShaggyEnobong Amao, MD  digoxin (LANOXIN) 0.125 MG tablet Take 0.5 tablets (0.0625 mg total) by mouth daily. Patient not taking: Reported on 01/01/2016 09/18/15   Amy D Clegg, NP  isosorbide-hydrALAZINE (BIDIL) 20-37.5 MG tablet Take 1 tablet by mouth 2 (two) times daily. Patient not taking: Reported on 01/01/2016 11/28/15   Jaclyn ShaggyEnobong Amao, MD  Lancet Devices [facility](ACCU-CHEK SOFTCLIX) lancets Use as instructed for once daily testing of blood sugar Patient not taking: Reported on 01/01/2016 11/28/15   Jaclyn ShaggyEnobong Amao, MD    Past Medical History: Past Medical History:  Diagnosis Date  . Atrial fibrillation (HCC)    RVR 10/2014  . Automatic implantable cardioverter-defibrillator in situ   . CAD (coronary artery disease) Sept 2013   s/p cardiac cath showing occlusion of small RCA with collaterals  . CHF  (congestive heart failure) (HCC)    20 to 25 % EF and RV dysfunction by 07/2014 echo   . Chronic anticoagulation    on xarelto.   . High cholesterol   . Hypertension   . Myocardial infarction 2014  . Noncompliance    homelessness contributing.   . Peripheral arterial disease (HCC)   . Type II diabetes mellitus (HCC)     Past Surgical History: Past Surgical History:  Procedure Laterality Date  . CARDIAC CATHETERIZATION  09/2012  . CORONARY ANGIOPLASTY WITH STENT PLACEMENT  11/2011   "1"  . CORONARY ARTERY BYPASS GRAFT  09/2012   2 vessels per patient [name]([facility])   . ESOPHAGOGASTRODUODENOSCOPY N/A 11/28/2014   Procedure: ESOPHAGOGASTRODUODENOSCOPY (EGD);  Surgeon:  Beverley Fiedler, MD;  Location: [facility] ENDOSCOPY;  Service: Endoscopy;  Laterality: N/A;  . ILIAC ARTERY STENT Right 08/30/2013  . IMPLANTABLE CARDIOVERTER DEFIBRILLATOR IMPLANT     [place] in 07/2012; [employer]  . LEFT AND RIGHT HEART CATHETERIZATION WITH CORONARY ANGIOGRAM N/A 12/02/2011   Procedure: LEFT AND RIGHT HEART CATHETERIZATION WITH CORONARY ANGIOGRAM;  Surgeon: Kathleene Hazel, MD;  Location: [facility] CATH LAB;  Service: Cardiovascular;  Laterality: N/A;  . LOWER EXTREMITY ANGIOGRAM N/A 08/30/2013   Procedure: LOWER EXTREMITY ANGIOGRAM;  Surgeon: Runell Gess, MD;  Location: [facility] CATH LAB;  Service: Cardiovascular;  Laterality: N/A;  . LOWER EXTREMITY ANGIOGRAM N/A 12/02/2013   Procedure: LOWER EXTREMITY ANGIOGRAM;  Surgeon: Runell Gess, MD;  Location: [facility] CATH LAB;  Service: Cardiovascular;  Laterality: N/A;    Family History: Family History  Problem Relation Age of Onset  . Diabetes Mother     Social History: Social History   Social History  . Marital status: Divorced    Spouse name: N/A  . Number of children: 3  . Years of education: N/A   Occupational History  . disabled    Social History Main Topics  . Smoking status: Current Every Day Smoker    Packs/day: 0.50    Years: 40.00    Types:  Cigarettes  . Smokeless tobacco: Never Used  . Alcohol use No  . Drug use: No  . Sexual activity: No   Other Topics Concern  . None   Social History Narrative   Has an apartment with a roommate. He was living on the streets in Feb 10, 2013.  He reports that his father died in [place] in Feb 10, 2013.  He is divorced.  He is no longer estranged from his son, but still from his daughter who lives locally.  Neither of his parents, nor any siblings have any history of CAD.    Allergies:  Allergies  Allergen Reactions  . Tape Itching    Paper tape please.    Objective:    Vital Signs:   Temp:  [97.6 F (36.4 C)-98.4 F (36.9 C)] 98.4 F (36.9 C) (10/10 1116) Pulse Rate:  [101-119] 112 (10/10 1116) Resp:  [18-31] 18 (10/10 1116) BP: (96-129)/(57-83) 96/73 (10/10 1116) SpO2:  [91 %-99 %] 94 % (10/10 1116) FiO2 (%):  [21 %] 21 % (10/09 2014) Weight:  [220 lb (99.8 kg)-231 lb 6.4 oz (105 kg)] 231 lb 6.4 oz (105 kg) (10/10 0515) Last BM Date: 01/01/16  Weight change: Filed Weights   01/01/16 1507 01/01/16 2004 01/02/16 0515  Weight: 220 lb (99.8 kg) 230 lb 6.4 oz (104.5 kg) 231 lb 6.4 oz (105 kg)    Intake/Output:   Intake/Output Summary (Last 24 hours) at 01/02/16 1403 Last data filed at 01/02/16 1300  Gross per 24 hour  Intake              960 ml  Output             1575 ml  Net             -615 ml     Physical Exam: General:  Sitting in chair. No resp difficulty HEENT: normal Neck: supple. JVP to jaw. Carotids 2+ bilat; no bruits. No lymphadenopathy or thyromegaly appreciated. Cor: PMI nondisplaced. Irregular.  No rubs or murmurs. +s3 Lungs: Diminished, much worse in bases. R > L. Abdomen: soft, NT, mildly distended, no HSM. No bruits or masses. +BS  Extremities: no cyanosis, clubbing, rash.  2-3+ edema to thighs. Neuro: alert & orientedx3, cranial nerves grossly intact. moves all 4 extremities w/o difficulty. Affect pleasant  Telemetry: Reviewed personally, Afib  110s  Labs: Basic Metabolic Panel:  Recent Labs Lab 01/01/16 1554 01/02/16 0840  NA 133* 135  K 5.7* 4.0  CL 97* 96*  CO2 26 27  GLUCOSE 161* 197*  BUN 35* 36*  CREATININE 1.61* 1.83*  CALCIUM 8.3* 8.6*    Liver Function Tests:  Recent Labs Lab 01/01/16 1554 01/02/16 0840  AST 49* 17  ALT 16* 11*  ALKPHOS 160* 148*  BILITOT 2.2* 1.7*  PROT 7.2 7.0  ALBUMIN 3.4* 3.3*   No results for input(s): LIPASE, AMYLASE in the last 168 hours. No results for input(s): AMMONIA in the last 168 hours.  CBC:  Recent Labs Lab 01/01/16 1554 01/02/16 0840  WBC 7.4 8.5  NEUTROABS 5.3 6.4  HGB 12.5* 12.3*  HCT 41.1 43.3  MCV 84.2 85.2  PLT 259 234    Cardiac Enzymes:  Recent Labs Lab 01/01/16 1554 01/01/16 2111 01/02/16 0228 01/02/16 0840  TROPONINI 0.05* 0.05* 0.04* 0.05*    BNP: BNP (last 3 results)  Recent Labs  08/15/15 1447 11/08/15 1450 01/01/16 1603  BNP 546.6* 748.2* 568.6*    ProBNP (last 3 results) No results for input(s): PROBNP in the last 8760 hours.   CBG:  Recent Labs Lab 01/01/16 2050 01/02/16 0536 01/02/16 1113  GLUCAP 267* 103* 249*    Coagulation Studies: No results for input(s): LABPROT, INR in the last 72 hours.  Other results: EKG: Sinus Tach 114 Imaging: Dg Chest 2 View  Result Date: 01/01/2016 CLINICAL DATA:  Chest pain, shortness of breath. EXAM: CHEST  2 VIEW COMPARISON:  Radiographs of November 08, 2015. FINDINGS: Stable cardiomediastinal silhouette. Status post coronary bypass graft. Left-sided pacemaker is unchanged in position. Central pulmonary vascular congestion is noted with bilateral diffuse interstitial densities concerning for pulmonary edema. No pneumothorax is noted. Mild right pleural effusion is noted. Bony thorax is unremarkable. IMPRESSION: Central pulmonary vascular congestion with bilateral pulmonary edema is noted. Mild right pleural effusion is noted. Electronically Signed   By: Lupita Raider, M.D.    On: 01/01/2016 16:51      Medications:     Current Medications: . amiodarone  200 mg Oral BID  . arformoterol  15 mcg Nebulization BID  . atorvastatin  40 mg Oral q1800  . budesonide (PULMICORT) nebulizer solution  0.5 mg Nebulization BID  . carvedilol  3.125 mg Oral BID WC  . digoxin  0.0625 mg Oral Daily  . ferrous sulfate  325 mg Oral TID WC  . furosemide  100 mg Intravenous Q12H  . gabapentin  300 mg Oral BID  . [START ON 01/03/2016] Influenza vac split quadrivalent PF  0.5 mL Intramuscular Tomorrow-1000  . insulin aspart  0-9 Units Subcutaneous TID WC  . nicotine  21 mg Transdermal Daily  . rivaroxaban  20 mg Oral QAC supper  . sodium chloride flush  3 mL Intravenous Q12H  . spironolactone  25 mg Oral Daily     Infusions:      Assessment   1. Acute on chronic systolic HF 2. Chronic Afib 3. DM2 4. H/o GI bleed 5. Tobacco abuse 6. CAD s/p CABG in 2014 at [facility] 7. CKD stage III 8. Non-compliance  Plan    He is markedly volume overloaded in the setting of medical non-compliance (including follow up) as well as dietary indiscretion (has 4 cups of water  and 1 juice at bedside)  Continue 100 mg IV lasix BID for now. Creatinine up slightly. Will add dose of 2.5 mg metolazone this evening.   If sluggish diuresis can add milrinone for inpatient augmentation only.  Pt not a candidate for advanced therapies including home inotrope support with marked non-compliance. Pt has failed paramedicine for same.    He currently resides in low income housing and medications should be $1.20 a piece. He has failed to reach out to HF clinic for help, despite being continually urged to do so.   Long term prognosis very poor.   Length of Stay: 0  Graciella Freer PA-C 01/02/2016, 2:03 PM  Advanced Heart Failure Team Pager [phone] (M-F; 7a - 4p)  Please contact [facility] for night-coverage after hours (4p -7a ) and weekends on [email or web address]  Patient seen and  examined with Otilio Saber, PA-C. We discussed all aspects of the encounter. I agree with the assessment and plan as stated above.   He is markedly volume overloaded and not responding to high-dose IV lasix. Renal function worsening. We will add milrinone to facilitate diuresis. He has chronic AF. Rate mildly elevated. Continue Xarelto. Discussed need for compliance once again.  The HF team will follow with you. May need a PICC line.   Bensimhon, Daniel,MD 5:29 PM

## 2016-01-02 NOTE — Progress Notes (Signed)
PROGRESS NOTE    Jacob [name]  AVW:[id number] DOB: [birth year] DOA: 01/01/2016 PCP: [name], MD   Outpatient Specialists:     Brief Narrative:  Jacob [name] is a 60 y.o. 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 He complains of substernal chest pain that he reports is similar to previous pain.  Associated symptoms include weight gain of 12 pounds, swelling of the legs/abdomen, PND, dyspnea on exertion, sleeps with 2-3 pillows, and orthopnea. Patient reports that he takes his medications as prescribed. However, patient then states that he has been out of digoxin and Bidil for at least 2 weeks now.  Patient's followed as an outpatient setting by Dr. Gala Romney. Last hospitalization was 8/16- 8/22 at [facility] with similar symptoms, and reported to be out of medications.   Assessment & Plan:   Principal Problem:   Acute on chronic combined systolic and diastolic CHF (congestive heart failure) (HCC) Active Problems:   Essential hypertension   AF (atrial fibrillation) (HCC)   Tobacco abuse   DM (diabetes mellitus), type 2 with peripheral vascular complications (HCC)   Chronic anticoagulation   Acute on chronic combined systolic and diastolic CHF: Patient presents with an estimated weight gain of 12 pounds in the last week. Last EF 25-30% in 10/2015 with diffuse hypokinesis. - Strict I&Os and daily weights  - Hold torsemide and continue Lasix IV 100 mg q 12 hrs - Continue Coreg and spironolactone - cardiology consult - Care management for assistance with medications  Atrial fibrillation on chronic anticoagulation therapy - continue to Xarelto, Amiodarone - added back on digoxin  Elevated troponin level/chest pain - Trend troponins - Add back on Bidil, but consider issues with patient obtaining this  medication  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 - Continue to monitor   Diabetes mellitus type 2 with peripheral vascular compensation - Hypoglycemic protocols - Held glipizide - Continue Gabapentin  Copd, without acute exacerbation - Changed Advair inhaler to nebulized budesonide and Brovana - Duonebs q 4hrs prn Sob/wheezing  Essential hypertension - continue current medication regimen  Chronic kidney disease stage III: Creatinine has ranged from 1.14- 1.99 over the last few months. Patient presents with a creatinine of 1.63 and a BUN of 35. - Recheck CMP in am  Elevated transaminases: Elevated AST and AP. Suspect secondary to passive congestion from CHF exacerbation. - Continue to monitor   Tobacco abuse - Nicotine patch - Counseled on the need of cessation of tobacco use    DVT prophylaxis:  SCD's  Code Status: Full Code   Family Communication: patient  Disposition Plan:  Once diuresed   Consultants:   cards      Subjective: Did not sleep well  Objective: Vitals:   01/02/16 0821 01/02/16 0822 01/02/16 0939 01/02/16 1116  BP:    96/73  Pulse:   (!) 115 (!) 112  Resp:    18  Temp:    98.4 F (36.9 C)  TempSrc:    Oral  SpO2: 91% 91%  94%  Weight:      Height:        Intake/Output Summary (Last 24 hours) at 01/02/16 1144 Last data filed at 01/02/16 1117  Gross per 24 hour  Intake              840 ml  Output  1575 ml  Net             -735 ml   Filed Weights   01/01/16 1507 01/01/16 2004 01/02/16 0515  Weight: 99.8 kg (220 lb) 104.5 kg (230 lb 6.4 oz) 105 kg (231 lb 6.4 oz)    Examination:  General exam: Appears calm and comfortable  Respiratory system: diminished Cardiovascular system: S1 & S2 heard, RRR. No JVD, murmurs, rubs, gallops or clicks. No pedal edema. Gastrointestinal system: Abdomen is nondistended, soft and nontender. No organomegaly or masses felt. Normal bowel sounds heard.     Data  Reviewed: I have personally reviewed following labs and imaging studies  CBC:  Recent Labs Lab 01/01/16 1554 01/02/16 0840  WBC 7.4 8.5  NEUTROABS 5.3 6.4  HGB 12.5* 12.3*  HCT 41.1 43.3  MCV 84.2 85.2  PLT 259 234   Basic Metabolic Panel:  Recent Labs Lab 01/01/16 1554 01/02/16 0840  NA 133* 135  K 5.7* 4.0  CL 97* 96*  CO2 26 27  GLUCOSE 161* 197*  BUN 35* 36*  CREATININE 1.61* 1.83*  CALCIUM 8.3* 8.6*   GFR: Estimated Creatinine Clearance: 49.1 mL/min (by C-G formula based on SCr of 1.83 mg/dL (H)). Liver Function Tests:  Recent Labs Lab 01/01/16 1554 01/02/16 0840  AST 49* 17  ALT 16* 11*  ALKPHOS 160* 148*  BILITOT 2.2* 1.7*  PROT 7.2 7.0  ALBUMIN 3.4* 3.3*   No results for input(s): LIPASE, AMYLASE in the last 168 hours. No results for input(s): AMMONIA in the last 168 hours. Coagulation Profile: No results for input(s): INR, PROTIME in the last 168 hours. Cardiac Enzymes:  Recent Labs Lab 01/01/16 1554 01/01/16 2111 01/02/16 0228 01/02/16 0840  TROPONINI 0.05* 0.05* 0.04* 0.05*   BNP (last 3 results) No results for input(s): PROBNP in the last 8760 hours. HbA1C: No results for input(s): HGBA1C in the last 72 hours. CBG:  Recent Labs Lab 01/01/16 2050 01/02/16 0536 01/02/16 1113  GLUCAP 267* 103* 249*   Lipid Profile: No results for input(s): CHOL, HDL, LDLCALC, TRIG, CHOLHDL, LDLDIRECT in the last 72 hours. Thyroid Function Tests: No results for input(s): TSH, T4TOTAL, FREET4, T3FREE, THYROIDAB in the last 72 hours. Anemia Panel: No results for input(s): VITAMINB12, FOLATE, FERRITIN, TIBC, IRON, RETICCTPCT in the last 72 hours. Urine analysis:    Component Value Date/Time   COLORURINE YELLOW 12/01/2014 1300   APPEARANCEUR CLEAR 12/01/2014 1300   LABSPEC 1.009 12/01/2014 1300   PHURINE 5.5 12/01/2014 1300   GLUCOSEU NEGATIVE 12/01/2014 1300   HGBUR NEGATIVE 12/01/2014 1300   BILIRUBINUR NEGATIVE 12/01/2014 1300   KETONESUR  NEGATIVE 12/01/2014 1300   PROTEINUR NEGATIVE 12/01/2014 1300   UROBILINOGEN 1.0 12/01/2014 1300   NITRITE NEGATIVE 12/01/2014 1300   LEUKOCYTESUR NEGATIVE 12/01/2014 1300     )No results found for this or any previous visit (from the past 240 hour(s)).    Anti-infectives    None       Radiology Studies: Dg Chest 2 View  Result Date: 01/01/2016 CLINICAL DATA:  Chest pain, shortness of breath. EXAM: CHEST  2 VIEW COMPARISON:  Radiographs of November 08, 2015. FINDINGS: Stable cardiomediastinal silhouette. Status post coronary bypass graft. Left-sided pacemaker is unchanged in position. Central pulmonary vascular congestion is noted with bilateral diffuse interstitial densities concerning for pulmonary edema. No pneumothorax is noted. Mild right pleural effusion is noted. Bony thorax is unremarkable. IMPRESSION: Central pulmonary vascular congestion with bilateral pulmonary edema is noted. Mild right pleural effusion is  noted. Electronically Signed   By: Lupita Raider, M.D.   On: 01/01/2016 16:51        Scheduled Meds: . amiodarone  200 mg Oral BID  . arformoterol  15 mcg Nebulization BID  . atorvastatin  40 mg Oral q1800  . budesonide (PULMICORT) nebulizer solution  0.5 mg Nebulization BID  . carvedilol  3.125 mg Oral BID WC  . digoxin  0.0625 mg Oral Daily  . ferrous sulfate  325 mg Oral TID WC  . furosemide  100 mg Intravenous Q12H  . gabapentin  300 mg Oral BID  . [START ON 01/03/2016] Influenza vac split quadrivalent PF  0.5 mL Intramuscular Tomorrow-1000  . insulin aspart  0-9 Units Subcutaneous TID WC  . nicotine  21 mg Transdermal Daily  . rivaroxaban  20 mg Oral QAC supper  . sodium chloride flush  3 mL Intravenous Q12H  . spironolactone  25 mg Oral Daily   Continuous Infusions:    LOS: 0 days    Time spent: 35 min    JESSICA Juanetta Gosling, DO [facility] Pager [phone]  If 7PM-7AM, please contact night-coverage [email or web address] Password  [email or web address] 01/02/2016, 11:44 AM

## 2016-01-02 NOTE — Care Management Note (Signed)
Case Management Note  Patient Details  Name: Johnmark Pinter MRN: [id number] Date of Birth: 04/28/1955  Subjective/Objective:       Admitted with CHF             Action/Plan: Patient well known from previous admission. Followed by the [facility] and [facility] for Primary Care; Has medical insurance with Medicare / Medicaid with prescription drug coverage; patient gets his medication from the [facility] pharmacy. CM will continue to follow for DCP  Expected Discharge Date:    Possibly 01/06/2016              Expected Discharge Plan:  Home/Self Care  Discharge planning Services  CM Consult  Status of Service:  In process, will continue to follow  Reola Mosher [phone] 01/02/2016, 10:26 AM

## 2016-01-02 NOTE — Hospital Discharge Follow-Up (Signed)
Met with the patient today as he is known to the [facility] and the [facility]. He stated that he was not feeling well. When questioned about why he has not come to the clinic to have his blood work done, he stated that he forget, despite being reminded multiple times. He also noted that he has not picked up his glucometer at [facility] and he will need to have a friend pick it up for him.  He was agreeable to having his appt with Dr Jarold Song rescheduled and he now has an appointment for 10/17 17 @ 1400 and the information was placed on the AVS.   Attempted to contact Crista Luria, [facility] # [phone],  and informed her of the patient's hospitalization and that his appointment at [facility] has been changed to 01/09/16 @ 1400. Voicemail message left requesting a call back   Olga Coaster, RN CM notified that the patient is known to [facility].  Will continue to follow his hospital course.

## 2016-01-03 LAB — BASIC METABOLIC PANEL
ANION GAP: 11 (ref 5–15)
BUN: 40 mg/dL — ABNORMAL HIGH (ref 6–20)
CALCIUM: 8.5 mg/dL — AB (ref 8.9–10.3)
CHLORIDE: 90 mmol/L — AB (ref 101–111)
CO2: 29 mmol/L (ref 22–32)
CREATININE: 1.88 mg/dL — AB (ref 0.61–1.24)
GFR calc non Af Amer: 38 mL/min — ABNORMAL LOW (ref >60)
GFR, EST AFRICAN AMERICAN: 44 mL/min — AB (ref >60)
Glucose, Bld: 196 mg/dL — ABNORMAL HIGH (ref 65–99)
Potassium: 3.8 mmol/L (ref 3.5–5.1)
Sodium: 130 mmol/L — ABNORMAL LOW (ref 135–145)

## 2016-01-03 LAB — GLUCOSE, CAPILLARY
GLUCOSE-CAPILLARY: 159 mg/dL — AB (ref 65–99)
Glucose-Capillary: 161 mg/dL — ABNORMAL HIGH (ref 65–99)
Glucose-Capillary: 234 mg/dL — ABNORMAL HIGH (ref 65–99)
Glucose-Capillary: 298 mg/dL — ABNORMAL HIGH (ref 65–99)
Glucose-Capillary: 195 mg/dL — ABNORMAL HIGH (ref 65–99)

## 2016-01-03 MED ORDER — POTASSIUM CHLORIDE CRYS ER 20 MEQ PO TBCR
40.0000 meq | EXTENDED_RELEASE_TABLET | Freq: Once | ORAL | Status: AC
  Administered 2016-01-03: 40 meq via ORAL
  Filled 2016-01-03: qty 2

## 2016-01-03 MED ORDER — METOLAZONE 5 MG PO TABS
5.0000 mg | ORAL_TABLET | Freq: Two times a day (BID) | ORAL | Status: DC
  Administered 2016-01-03 – 2016-01-07 (×10): 5 mg via ORAL
  Filled 2016-01-03 (×11): qty 1

## 2016-01-03 MED ORDER — INSULIN ASPART 100 UNIT/ML ~~LOC~~ SOLN
0.0000 [IU] | Freq: Every day | SUBCUTANEOUS | Status: DC
  Administered 2016-01-04 – 2016-01-06 (×2): 3 [IU] via SUBCUTANEOUS

## 2016-01-03 MED ORDER — CARVEDILOL 3.125 MG PO TABS
3.1250 mg | ORAL_TABLET | Freq: Two times a day (BID) | ORAL | Status: DC
  Administered 2016-01-04 – 2016-01-10 (×14): 3.125 mg via ORAL
  Filled 2016-01-03 (×14): qty 1

## 2016-01-03 MED ORDER — FUROSEMIDE 10 MG/ML IJ SOLN
80.0000 mg | Freq: Three times a day (TID) | INTRAMUSCULAR | Status: DC
  Administered 2016-01-03 – 2016-01-07 (×15): 80 mg via INTRAVENOUS
  Filled 2016-01-03 (×15): qty 8

## 2016-01-03 MED ORDER — FERROUS SULFATE 325 (65 FE) MG PO TABS
325.0000 mg | ORAL_TABLET | Freq: Three times a day (TID) | ORAL | Status: DC
  Administered 2016-01-04 – 2016-01-12 (×26): 325 mg via ORAL
  Filled 2016-01-03 (×26): qty 1

## 2016-01-03 MED ORDER — INSULIN ASPART 100 UNIT/ML ~~LOC~~ SOLN
0.0000 [IU] | Freq: Three times a day (TID) | SUBCUTANEOUS | Status: DC
  Administered 2016-01-03: 5 [IU] via SUBCUTANEOUS
  Administered 2016-01-04: 2 [IU] via SUBCUTANEOUS
  Administered 2016-01-04: 9 [IU] via SUBCUTANEOUS
  Administered 2016-01-04 – 2016-01-05 (×3): 2 [IU] via SUBCUTANEOUS
  Administered 2016-01-05: 7 [IU] via SUBCUTANEOUS
  Administered 2016-01-06: 5 [IU] via SUBCUTANEOUS
  Administered 2016-01-06: 9 [IU] via SUBCUTANEOUS
  Administered 2016-01-06: 2 [IU] via SUBCUTANEOUS
  Administered 2016-01-07: 7 [IU] via SUBCUTANEOUS

## 2016-01-03 NOTE — Progress Notes (Signed)
Patient alert and drowsy non compliant throughout the night, R/A B/p stable low, up in the chair until 0645. Encouraged to position in bed instead of chair to decrease swelling in legs.

## 2016-01-03 NOTE — Hospital Discharge Follow-Up (Signed)
[facility] at [place]:  Patient known to the [facility] [place]. Met with patient at bedside to determine primary care post-discharge plans. Patient agreeable to follow-up with the [facility]itional [facility] Clinic after discharge. After previous discharge, patient presented to [facility] once but missed subsequent appointments, did not obtain labwork, glucometer, or medication refills. Inquired about his noncompliance, and patient indicated he was "lazy." Patient denied depression and indicated he sometimes got tired of taking all of his medications. Thoroughly discussed his chronic disease processes and discussed importance of medication compliance. Patient verbalized understanding.   This Case Manager spoke with Quintin Alto, [place], who follows patient in community. She is aware patient is hospitalized. She indicated she planned to accompany patient to his next [facility] meeting but will be unable to make it to an appointment on 01/09/16 at 1400 due to a meeting. Patient agreeable to changing his appointment time so Quintin Alto can accompany him to his appointment. Appointment moved to 01/09/16 at 1115. Will update AVS. Olga Coaster, RN CM updated.

## 2016-01-03 NOTE — Progress Notes (Signed)
PROGRESS NOTE    Jacob [name]  NID:[id number] DOB: 08/24/1955 DOA: 01/01/2016 PCP: [name], MD   Outpatient Specialists:     Brief Narrative:  Jacob [name] is a 60 y.o. 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 He complains of substernal chest pain that he reports is similar to previous pain.  Associated symptoms include weight gain of 12 pounds, swelling of the legs/abdomen, PND, dyspnea on exertion, sleeps with 2-3 pillows, and orthopnea. Patient reports that he takes his medications as prescribed. However, patient then states that he has been out of digoxin and Bidil for at least 2 weeks now.  Patient's followed as an outpatient setting by Dr. Gala Romney. Last hospitalization was 8/16- 8/22 at [facility] with similar symptoms, and reported to be out of medications.   Assessment & Plan:   Principal Problem:   Acute on chronic combined systolic and diastolic CHF (congestive heart failure) (HCC) Active Problems:   Essential hypertension   AF (atrial fibrillation) (HCC)   Tobacco abuse   DM (diabetes mellitus), type 2 with peripheral vascular complications (HCC)   Chronic anticoagulation   Acute on chronic combined systolic and diastolic CHF: Patient presents with an estimated weight gain of 12 pounds in the last week. Last EF 25-30% in 10/2015 with diffuse hypokinesis. - Strict I&Os and daily weights  - per CHF team: Started on milrinone 0.25 mcg + 100 mg IV lasix twice daily + metolazone - Continue Coreg and spironolactone - Care management for assistance with medications -patient is non-compliant  Atrial fibrillation on chronic anticoagulation therapy - continue to Xarelto, Amiodarone - added back on digoxin  Elevated troponin level/chest pain - Trend troponins - Add back on Bidil, but  consider issues with patient obtaining this medication  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 - Continue to monitor   Diabetes mellitus type 2 with peripheral vascular compensation - Hypoglycemic protocols - Held glipizide - Continue Gabapentin  Copd, without acute exacerbation - Changed Advair inhaler to nebulized budesonide and Brovana - Duonebs q 4hrs prn Sob/wheezing  Essential hypertension - continue current medication regimen  Chronic kidney disease stage III: Creatinine has ranged from 1.14- 1.99 over the last few months. Patient presents with a creatinine of 1.63 and a BUN of 35. - Recheck CMP in am  Elevated transaminases: Elevated AST and AP. Suspect secondary to passive congestion from CHF exacerbation. - Continue to monitor   Tobacco abuse - Nicotine patch - Counseled on the need of cessation of tobacco use    DVT prophylaxis:  SCD's  Code Status: Full Code   Family Communication: patient  Disposition Plan:  Once diuresed   Consultants:   cards      Subjective: Sleeping again this AM   Objective: Vitals:   01/03/16 0449 01/03/16 0519 01/03/16 0753 01/03/16 1128  BP: 98/77   102/65  Pulse: (!) 105   (!) 112  Resp: 18   18  Temp: 98.5 F (36.9 C)   97.6 F (36.4 C)  TempSrc: Oral   Oral  SpO2: 92%  93% 96%  Weight:  105.3 kg (232 lb 3.2 oz)    Height:        Intake/Output Summary (Last 24 hours) at 01/03/16 1259 Last data filed at 01/03/16 1127  Gross per 24 hour  Intake  895.83 ml  Output             2425 ml  Net         -1529.17 ml   Filed Weights   01/01/16 2004 01/02/16 0515 01/03/16 0519  Weight: 104.5 kg (230 lb 6.4 oz) 105 kg (231 lb 6.4 oz) 105.3 kg (232 lb 3.2 oz)    Examination:  General exam: chronically ill apearring-- obese Respiratory system: diminished- crackles Cardiovascular system: iRR. No JVD, murmurs, rubs, gallops or clicks. No pedal edema. Gastrointestinal system: Abdomen is  nondistended, soft and nontender. No organomegaly or masses felt. Normal bowel sounds heard.     Data Reviewed: I have personally reviewed following labs and imaging studies  CBC:  Recent Labs Lab 01/01/16 1554 01/02/16 0840  WBC 7.4 8.5  NEUTROABS 5.3 6.4  HGB 12.5* 12.3*  HCT 41.1 43.3  MCV 84.2 85.2  PLT 259 234   Basic Metabolic Panel:  Recent Labs Lab 01/01/16 1554 01/02/16 0840 01/03/16 0344  NA 133* 135 130*  K 5.7* 4.0 3.8  CL 97* 96* 90*  CO2 26 27 29   GLUCOSE 161* 197* 196*  BUN 35* 36* 40*  CREATININE 1.61* 1.83* 1.88*  CALCIUM 8.3* 8.6* 8.5*   GFR: Estimated Creatinine Clearance: 47.9 mL/min (by C-G formula based on SCr of 1.88 mg/dL (H)). Liver Function Tests:  Recent Labs Lab 01/01/16 1554 01/02/16 0840  AST 49* 17  ALT 16* 11*  ALKPHOS 160* 148*  BILITOT 2.2* 1.7*  PROT 7.2 7.0  ALBUMIN 3.4* 3.3*   No results for input(s): LIPASE, AMYLASE in the last 168 hours. No results for input(s): AMMONIA in the last 168 hours. Coagulation Profile: No results for input(s): INR, PROTIME in the last 168 hours. Cardiac Enzymes:  Recent Labs Lab 01/01/16 1554 01/01/16 2111 01/02/16 0228 01/02/16 0840  TROPONINI 0.05* 0.05* 0.04* 0.05*   BNP (last 3 results) No results for input(s): PROBNP in the last 8760 hours. HbA1C: No results for input(s): HGBA1C in the last 72 hours. CBG:  Recent Labs Lab 01/02/16 1636 01/02/16 2115 01/03/16 0526 01/03/16 0753 01/03/16 1125  GLUCAP 198* 173* 159* 161* 234*   Lipid Profile: No results for input(s): CHOL, HDL, LDLCALC, TRIG, CHOLHDL, LDLDIRECT in the last 72 hours. Thyroid Function Tests: No results for input(s): TSH, T4TOTAL, FREET4, T3FREE, THYROIDAB in the last 72 hours. Anemia Panel: No results for input(s): VITAMINB12, FOLATE, FERRITIN, TIBC, IRON, RETICCTPCT in the last 72 hours. Urine analysis:    Component Value Date/Time   COLORURINE YELLOW 12/01/2014 1300   APPEARANCEUR CLEAR  12/01/2014 1300   LABSPEC 1.009 12/01/2014 1300   PHURINE 5.5 12/01/2014 1300   GLUCOSEU NEGATIVE 12/01/2014 1300   HGBUR NEGATIVE 12/01/2014 1300   BILIRUBINUR NEGATIVE 12/01/2014 1300   KETONESUR NEGATIVE 12/01/2014 1300   PROTEINUR NEGATIVE 12/01/2014 1300   UROBILINOGEN 1.0 12/01/2014 1300   NITRITE NEGATIVE 12/01/2014 1300   LEUKOCYTESUR NEGATIVE 12/01/2014 1300     )No results found for this or any previous visit (from the past 240 hour(s)).    Anti-infectives    None       Radiology Studies: Dg Chest 2 View  Result Date: 01/01/2016 CLINICAL DATA:  Chest pain, shortness of breath. EXAM: CHEST  2 VIEW COMPARISON:  Radiographs of November 08, 2015. FINDINGS: Stable cardiomediastinal silhouette. Status post coronary bypass graft. Left-sided pacemaker is unchanged in position. Central pulmonary vascular congestion is noted with bilateral diffuse interstitial densities concerning for pulmonary edema. No pneumothorax is noted.  Mild right pleural effusion is noted. Bony thorax is unremarkable. IMPRESSION: Central pulmonary vascular congestion with bilateral pulmonary edema is noted. Mild right pleural effusion is noted. Electronically Signed   By: Lupita RaiderJames  Green Jr, M.D.   On: 01/01/2016 16:51        Scheduled Meds: . amiodarone  200 mg Oral BID  . arformoterol  15 mcg Nebulization BID  . atorvastatin  40 mg Oral q1800  . budesonide (PULMICORT) nebulizer solution  0.5 mg Nebulization BID  . carvedilol  3.125 mg Oral BID WC  . digoxin  0.0625 mg Oral Daily  . ferrous sulfate  325 mg Oral TID WC  . furosemide  80 mg Intravenous TID  . gabapentin  300 mg Oral BID  . Influenza vac split quadrivalent PF  0.5 mL Intramuscular Tomorrow-1000  . insulin aspart  0-5 Units Subcutaneous QHS  . insulin aspart  0-9 Units Subcutaneous TID WC  . metolazone  5 mg Oral BID  . nicotine  21 mg Transdermal Daily  . rivaroxaban  20 mg Oral QAC supper  . sodium chloride flush  3 mL Intravenous  Q12H  . spironolactone  25 mg Oral Daily   Continuous Infusions: . milrinone 0.25 mcg/kg/min (01/03/16 0151)     LOS: 1 day    Time spent: 25 min    Charisma Charlot U Glynna Failla, DO [facility] Pager [phone]  If 7PM-7AM, please contact night-coverage [email or web address] Password [email or web address] 01/03/2016, 12:59 PM

## 2016-01-03 NOTE — Progress Notes (Addendum)
Inpatient Diabetes Program Recommendations  AACE/ADA: New Consensus Statement on Inpatient Glycemic Control (2015)  Target Ranges:  Prepandial:   less than 140 mg/dL      Peak postprandial:   less than 180 mg/dL (1-2 hours)      Critically ill patients:  140 - 180 mg/dL   Results for Jacob [name], Jacob [name] (MRN [id number]) as of 01/03/2016 09:43  Ref. Range 01/02/2016 05:36 01/02/2016 08:40 01/02/2016 11:13 01/02/2016 16:36 01/02/2016 21:15 01/03/2016 03:44 01/03/2016 05:26 01/03/2016 07:53  Glucose-Capillary Latest Ref Range: 65 - 99 mg/dL 600 (H)  459 (H) 977 (H) 173 (H)  159 (H) 161 (H)  Results for Jacob [name], Jacob [name] (MRN [id number]) as of 01/03/2016 09:43  Ref. Range 04/04/2015 14:25 11/09/2015 02:48  Hemoglobin A1C Latest Ref Range: 4.8 - 5.6 % 7.90 11.0 (H)    Review of Glycemic Control  Diabetes history: DM2 (diet order modified to carb modified) Outpatient Diabetes medications: Glipizide 10 mg BID Current orders for Inpatient glycemic control: Novolog 0-9 units [facility]  Inpatient Diabetes Program Recommendations: Please consider:  Increasing Novolog 0-15 units TIDAC, Novolog 0-5 units QHS;  Outpatient smoking cessation classes;  Outpatient DM self-management education/support classes.  Thank you,  Kristine Linea, RN, BSN Diabetes Coordinator Inpatient Diabetes Program [phone] (Team Pager) [phone] ([facility] office) [phone] [facility] office) [phone] [facility] office)

## 2016-01-03 NOTE — Progress Notes (Signed)
Advanced Heart Failure Rounding Note   Subjective:    Admitted with marked volume overload. Started on milrinone 0.25 mcg + 100 mg IV lasix twice daily + metolazone. Sluggish diuresis noted. Weight up 1 pound. Creatinine unchanged 1.8.    Complains of fatigue. SOB with exertion.      Objective:   Weight Range:  Vital Signs:   Temp:  [98.1 F (36.7 C)-98.9 F (37.2 C)] 98.5 F (36.9 C) (10/11 0449) Pulse Rate:  [105-115] 105 (10/11 0449) Resp:  [16-24] 18 (10/11 0449) BP: (96-107)/(47-77) 98/77 (10/11 0449) SpO2:  [91 %-95 %] 93 % (10/11 0753) Weight:  [232 lb 3.2 oz (105.3 kg)] 232 lb 3.2 oz (105.3 kg) (10/11 0519) Last BM Date: 01/01/16  Weight change: Filed Weights   01/01/16 2004 01/02/16 0515 01/03/16 0519  Weight: 230 lb 6.4 oz (104.5 kg) 231 lb 6.4 oz (105 kg) 232 lb 3.2 oz (105.3 kg)    Intake/Output:   Intake/Output Summary (Last 24 hours) at 01/03/16 0802 Last data filed at 01/03/16 0450  Gross per 24 hour  Intake           775.83 ml  Output             1825 ml  Net         -1049.17 ml     Physical Exam: General:  Chronically ill appearing. No resp difficulty. In the chair  HEENT: normal Neck: supple. JVP to ear  . Carotids 2+ bilat; no bruits. No lymphadenopathy or thryomegaly appreciated. Cor: PMI nondisplaced. Irregular rate & rhythm. No rubs, gallops or murmurs. Lungs: RML RLL LLL crackles.  Abdomen: soft, nontender, + distended. No hepatosplenomegaly. No bruits or masses. Good bowel sounds. Extremities: no cyanosis, clubbing, rash, R and LLE 3+ edema Neuro: alert & orientedx3, cranial nerves grossly intact. moves all 4 extremities w/o difficulty. Affect pleasant  Telemetry:  A fib 110s   Labs: Basic Metabolic Panel:  Recent Labs Lab 01/01/16 1554 01/02/16 0840 01/03/16 0344  NA 133* 135 130*  K 5.7* 4.0 3.8  CL 97* 96* 90*  CO2 26 27 29   GLUCOSE 161* 197* 196*  BUN 35* 36* 40*  CREATININE 1.61* 1.83* 1.88*  CALCIUM 8.3* 8.6*  8.5*    Liver Function Tests:  Recent Labs Lab 01/01/16 1554 01/02/16 0840  AST 49* 17  ALT 16* 11*  ALKPHOS 160* 148*  BILITOT 2.2* 1.7*  PROT 7.2 7.0  ALBUMIN 3.4* 3.3*   No results for input(s): LIPASE, AMYLASE in the last 168 hours. No results for input(s): AMMONIA in the last 168 hours.  CBC:  Recent Labs Lab 01/01/16 1554 01/02/16 0840  WBC 7.4 8.5  NEUTROABS 5.3 6.4  HGB 12.5* 12.3*  HCT 41.1 43.3  MCV 84.2 85.2  PLT 259 234    Cardiac Enzymes:  Recent Labs Lab 01/01/16 1554 01/01/16 2111 01/02/16 0228 01/02/16 0840  TROPONINI 0.05* 0.05* 0.04* 0.05*    BNP: BNP (last 3 results)  Recent Labs  08/15/15 1447 11/08/15 1450 01/01/16 1603  BNP 546.6* 748.2* 568.6*    ProBNP (last 3 results) No results for input(s): PROBNP in the last 8760 hours.    Other results:  Imaging: Dg Chest 2 View  Result Date: 01/01/2016 CLINICAL DATA:  Chest pain, shortness of breath. EXAM: CHEST  2 VIEW COMPARISON:  Radiographs of November 08, 2015. FINDINGS: Stable cardiomediastinal silhouette. Status post coronary bypass graft. Left-sided pacemaker is unchanged in position. Central pulmonary vascular congestion is noted  with bilateral diffuse interstitial densities concerning for pulmonary edema. No pneumothorax is noted. Mild right pleural effusion is noted. Bony thorax is unremarkable. IMPRESSION: Central pulmonary vascular congestion with bilateral pulmonary edema is noted. Mild right pleural effusion is noted. Electronically Signed   By: Lupita RaiderJames  Green Jr, M.D.   On: 01/01/2016 16:51      Medications:     Scheduled Medications: . amiodarone  200 mg Oral BID  . arformoterol  15 mcg Nebulization BID  . atorvastatin  40 mg Oral q1800  . budesonide (PULMICORT) nebulizer solution  0.5 mg Nebulization BID  . carvedilol  3.125 mg Oral BID WC  . digoxin  0.0625 mg Oral Daily  . ferrous sulfate  325 mg Oral TID WC  . furosemide  100 mg Intravenous Q12H  .  gabapentin  300 mg Oral BID  . Influenza vac split quadrivalent PF  0.5 mL Intramuscular Tomorrow-1000  . insulin aspart  0-9 Units Subcutaneous TID WC  . nicotine  21 mg Transdermal Daily  . rivaroxaban  20 mg Oral QAC supper  . sodium chloride flush  3 mL Intravenous Q12H  . spironolactone  25 mg Oral Daily     Infusions: . milrinone 0.25 mcg/kg/min (01/03/16 0151)     PRN Medications:  sodium chloride, albuterol, ipratropium-albuterol, ondansetron (ZOFRAN) IV, sodium chloride flush   Assessment:  1. Acute on chronic systolic HF    --echo 8/17 EF 21-30%25-30% 2. Chronic Afib 3. DM2 4. H/o GI bleed 5. Tobacco abuse 6. [facility] 7. 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 Non-compliance   Plan/Discussion:    Marked volume overload in the setting of medication noncompliance.   Sluggish diuresis noted. Increase lasix to 80 mg three times daily + metolazone 5 mg twice daily+ milrinone 0.25 mcg. Renal function ok.   Remains in A Fib RVR. A fib chronic for him. Continue amiodarone 200 mg twice daily. On xarelto.    Length of Stay: 1  Amy Clegg NP-C  01/03/2016, 8:02 AM  Advanced Heart Failure Team Pager [phone] (M-F; 7a - 4p)  Please contact [facility] for night-coverage after hours (4p -7a ) and weekends on [email or web address]  Patient seen and examined with Tonye BecketAmy Clegg, NP. We discussed all aspects of the encounter. I agree with the assessment and plan as stated above.   Milrinone started yesterday for low output HF. Urine output remains sluggish but starting to get lighter so I suspect cardiac output improved on milrinone. Renal function slightly worse however. Agree with increasing diuretics today. If out remains sluggish will need PICC line.AF is chronic for him and has failed multiple DC-CVs. Continue amio for rate control. On Xarelto for [facility].Watch electrolytes.   Korey Arroyo,MD 3:08 PM

## 2016-01-04 ENCOUNTER — Ambulatory Visit: Payer: Medicare Other | Admitting: Family Medicine

## 2016-01-04 DIAGNOSIS — I481 Persistent atrial fibrillation: Secondary | ICD-10-CM

## 2016-01-04 LAB — GLUCOSE, CAPILLARY
GLUCOSE-CAPILLARY: 188 mg/dL — AB (ref 65–99)
GLUCOSE-CAPILLARY: 178 mg/dL — AB (ref 65–99)
Glucose-Capillary: 393 mg/dL — ABNORMAL HIGH (ref 65–99)
Glucose-Capillary: 253 mg/dL — ABNORMAL HIGH (ref 65–99)

## 2016-01-04 LAB — MAGNESIUM: Magnesium: 2 mg/dL (ref 1.7–2.4)

## 2016-01-04 LAB — BASIC METABOLIC PANEL
ANION GAP: 12 (ref 5–15)
BUN: 44 mg/dL — ABNORMAL HIGH (ref 6–20)
CO2: 28 mmol/L (ref 22–32)
Calcium: 8.7 mg/dL — ABNORMAL LOW (ref 8.9–10.3)
Chloride: 88 mmol/L — ABNORMAL LOW (ref 101–111)
Creatinine, Ser: 1.8 mg/dL — ABNORMAL HIGH (ref 0.61–1.24)
GFR calc non Af Amer: 40 mL/min — ABNORMAL LOW (ref >60)
GFR, EST AFRICAN AMERICAN: 46 mL/min — AB (ref >60)
GLUCOSE: 257 mg/dL — AB (ref 65–99)
POTASSIUM: 4.7 mmol/L (ref 3.5–5.1)
Sodium: 128 mmol/L — ABNORMAL LOW (ref 135–145)

## 2016-01-04 MED ORDER — LIVING WELL WITH DIABETES BOOK
Freq: Once | Status: AC
  Administered 2016-01-05: 1
  Filled 2016-01-04: qty 1

## 2016-01-04 NOTE — Progress Notes (Signed)
Pharmacist Heart Failure Core Measure Documentation  Assessment: Jacob [name] has an EF documented as 25-30% on 11/10/15 by Dr. Ophelia Charter.  Rationale: Heart failure patients with left ventricular systolic dysfunction (LVSD) and an EF < 40% should be prescribed an angiotensin converting enzyme inhibitor (ACEI) or angiotensin receptor blocker (ARB) at discharge unless a contraindication is documented in the medical record.  This patient is not currently on an ACEI or ARB for HF.  This note is being placed in the record in order to provide documentation that a contraindication to the use of these agents is present for this encounter.  ACE Inhibitor or Angiotensin Receptor Blocker is contraindicated (specify all that apply)  []   ACEI allergy AND ARB allergy []   Angioedema []   Moderate or severe aortic stenosis []   Hyperkalemia [x]   Hypotension []   Renal artery stenosis []   Worsening renal function, preexisting renal disease or dysfunction   Jacob [name]. Jacob [name], PharmD, BCPS Clinical Pharmacist Pager [phone] 01/04/2016 8:29 AM

## 2016-01-04 NOTE — Progress Notes (Signed)
Nutrition Brief Note  RD consulted to heart failure diet and fluid restriction  Wt Readings from Last 15 Encounters:  01/04/16 227 lb 6.4 oz (103.1 kg)  11/28/15 221 lb 12.8 oz (100.6 kg)  11/14/15 212 lb 1.6 oz (96.2 kg)  09/13/15 211 lb (95.7 kg)  08/31/15 201 lb 6.4 oz (91.4 kg)  08/15/15 226 lb (102.5 kg)  05/17/15 231 lb 6.4 oz (105 kg)  05/12/15 230 lb (104.3 kg)  04/28/15 226 lb (102.5 kg)  04/27/15 226 lb (102.5 kg)  04/18/15 223 lb (101.2 kg)  04/07/15 217 lb 4.8 oz (98.6 kg)  04/04/15 218 lb 12.8 oz (99.2 kg)  03/30/15 217 lb (98.4 kg)  03/28/15 213 lb (96.6 kg)    Body mass index is 37.84 kg/m. Patient meets criteria for Obesity based on current BMI. RD familiar with patient from previous admission. Pt reports receiving low sodium and carb modified diet education in the past. He reports following a low sodium diet PTA and denies any education needs at this time. Per chart, pt has been complaining of feeling thirsty. RD discussed thirst-quenching tips. Pt very lethargic at time of visit and requested RD return at later date to check in.   Current diet order is Heart Healthy/Carb Modified, patient is consuming approximately 100% of meals at this time. Labs and medications reviewed.   No nutrition interventions warranted at this time. RD will check in at later date when patient is more alert, to reassess needs.   Dorothea Ogle RD, CSP, LDN Inpatient Clinical Dietitian Pager: [phone] After Hours Pager: [phone]

## 2016-01-04 NOTE — Progress Notes (Signed)
Pt constantly requests fluid from RN, NT, and other staff(water, coffee, ice) despite RN education about fluid restriction and diet at beginning of shift. Pt states he avoids salt, salty foods, processed foods, and fast foods. Will continue to monitor.

## 2016-01-04 NOTE — Progress Notes (Signed)
PROGRESS NOTE    Jacob [name]  WUJ:[id number] DOB: Oct 09, 1955 DOA: 01/01/2016 PCP: [name], MD   Outpatient Specialists:     Brief Narrative:  Jacob [name] is a 60 y.o. 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 He complains of substernal chest pain that he reports is similar to previous pain.  Associated symptoms include weight gain of 12 pounds, swelling of the legs/abdomen, PND, dyspnea on exertion, sleeps with 2-3 pillows, and orthopnea. Patient reports that he takes his medications as prescribed. However, patient then states that he has been out of digoxin and Bidil for at least 2 weeks now.  Patient's followed as an outpatient setting by Dr. Gala Romney. Last hospitalization was 8/16- 8/22 at [facility] with similar symptoms, and reported to be out of medications.   Assessment & Plan:   Principal Problem:   Acute on chronic combined systolic and diastolic CHF (congestive heart failure) (HCC) Active Problems:   Essential hypertension   AF (atrial fibrillation) (HCC)   Tobacco abuse   DM (diabetes mellitus), type 2 with peripheral vascular complications (HCC)   Chronic anticoagulation   Acute on chronic combined systolic and diastolic CHF: Patient presents with an estimated weight gain of 12 pounds in the last week. Last EF 25-30% in 10/2015 with diffuse hypokinesis. - Strict I&Os and daily weights  - per CHF team: Started on milrinone 0.25 mcg + 100 mg IV lasix twice daily + metolazone - Continue Coreg and spironolactone - Care management for assistance with medications -patient is non-compliant  Atrial fibrillation on chronic anticoagulation therapy - continue to Xarelto, Amiodarone - added back on digoxin  Elevated troponin level/chest pain - Trend troponins - Add back on Bidil, but  consider issues with patient obtaining this medication  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 - Continue to monitor   Diabetes mellitus type 2 with peripheral vascular compensation - Hypoglycemic protocols - Held glipizide - Continue Gabapentin  Copd, without acute exacerbation - Changed Advair inhaler to nebulized budesonide and Brovana - Duonebs q 4hrs prn Sob/wheezing  Essential hypertension - continue current medication regimen  Chronic kidney disease stage III: Creatinine has ranged from 1.14- 1.99 over the last few months. Patient presents with a creatinine of 1.63 and a BUN of 35. - Recheck CMP in am  Elevated transaminases: Elevated AST and AP. Suspect secondary to passive congestion from CHF exacerbation. - Continue to monitor   Tobacco abuse - Nicotine patch - Counseled on the need of cessation of tobacco use    DVT prophylaxis:  SCD's  Code Status: Full Code   Family Communication: patient  Disposition Plan:  Once diuresed   Consultants:   cards      Subjective: Knows he needs to take his medications   Objective: Vitals:   01/03/16 2022 01/04/16 0001 01/04/16 0434 01/04/16 1018  BP:  104/62 115/60   Pulse:  70 (!) 111   Resp:  18 18   Temp:  98.3 F (36.8 C) 98 F (36.7 C)   TempSrc:  Oral Oral   SpO2: 95% 95% 96% 97%  Weight:   103.1 kg (227 lb 6.4 oz)   Height:        Intake/Output Summary (Last 24 hours) at 01/04/16 1207 Last data filed at 01/04/16 1133  Gross per 24 hour  Intake  1753.89 ml  Output             6025 ml  Net         -4271.11 ml   Filed Weights   01/02/16 0515 01/03/16 0519 01/04/16 0434  Weight: 105 kg (231 lb 6.4 oz) 105.3 kg (232 lb 3.2 oz) 103.1 kg (227 lb 6.4 oz)    Examination:  General exam: chronically ill apearring-- obese Respiratory system: diminished- crackles Cardiovascular system: iRR. No JVD, murmurs, rubs, gallops or clicks. No pedal edema. Gastrointestinal system:  Abdomen is nondistended, soft and nontender. No organomegaly or masses felt. Normal bowel sounds heard.     Data Reviewed: I have personally reviewed following labs and imaging studies  CBC:  Recent Labs Lab 01/01/16 1554 01/02/16 0840  WBC 7.4 8.5  NEUTROABS 5.3 6.4  HGB 12.5* 12.3*  HCT 41.1 43.3  MCV 84.2 85.2  PLT 259 234   Basic Metabolic Panel:  Recent Labs Lab 01/01/16 1554 01/02/16 0840 01/03/16 0344 01/04/16 0304  NA 133* 135 130* 128*  K 5.7* 4.0 3.8 4.7  CL 97* 96* 90* 88*  CO2 26 27 29 28   GLUCOSE 161* 197* 196* 257*  BUN 35* 36* 40* 44*  CREATININE 1.61* 1.83* 1.88* 1.80*  CALCIUM 8.3* 8.6* 8.5* 8.7*  MG  --   --   --  2.0   GFR: Estimated Creatinine Clearance: 49.4 mL/min (by C-G formula based on SCr of 1.8 mg/dL (H)). Liver Function Tests:  Recent Labs Lab 01/01/16 1554 01/02/16 0840  AST 49* 17  ALT 16* 11*  ALKPHOS 160* 148*  BILITOT 2.2* 1.7*  PROT 7.2 7.0  ALBUMIN 3.4* 3.3*   No results for input(s): LIPASE, AMYLASE in the last 168 hours. No results for input(s): AMMONIA in the last 168 hours. Coagulation Profile: No results for input(s): INR, PROTIME in the last 168 hours. Cardiac Enzymes:  Recent Labs Lab 01/01/16 1554 01/01/16 2111 01/02/16 0228 01/02/16 0840  TROPONINI 0.05* 0.05* 0.04* 0.05*   BNP (last 3 results) No results for input(s): PROBNP in the last 8760 hours. HbA1C: No results for input(s): HGBA1C in the last 72 hours. CBG:  Recent Labs Lab 01/03/16 1125 01/03/16 1635 01/03/16 2100 01/04/16 0607 01/04/16 1143  GLUCAP 234* 298* 195* 188* 393*   Lipid Profile: No results for input(s): CHOL, HDL, LDLCALC, TRIG, CHOLHDL, LDLDIRECT in the last 72 hours. Thyroid Function Tests: No results for input(s): TSH, T4TOTAL, FREET4, T3FREE, THYROIDAB in the last 72 hours. Anemia Panel: No results for input(s): VITAMINB12, FOLATE, FERRITIN, TIBC, IRON, RETICCTPCT in the last 72 hours. Urine analysis:      Component Value Date/Time   COLORURINE YELLOW 12/01/2014 1300   APPEARANCEUR CLEAR 12/01/2014 1300   LABSPEC 1.009 12/01/2014 1300   PHURINE 5.5 12/01/2014 1300   GLUCOSEU NEGATIVE 12/01/2014 1300   HGBUR NEGATIVE 12/01/2014 1300   BILIRUBINUR NEGATIVE 12/01/2014 1300   KETONESUR NEGATIVE 12/01/2014 1300   PROTEINUR NEGATIVE 12/01/2014 1300   UROBILINOGEN 1.0 12/01/2014 1300   NITRITE NEGATIVE 12/01/2014 1300   LEUKOCYTESUR NEGATIVE 12/01/2014 1300     )No results found for this or any previous visit (from the past 240 hour(s)).    Anti-infectives    None       Radiology Studies: No results found.      Scheduled Meds: . amiodarone  200 mg Oral BID  . arformoterol  15 mcg Nebulization BID  . atorvastatin  40 mg Oral q1800  . budesonide (PULMICORT) nebulizer solution  0.5 mg Nebulization BID  . carvedilol  3.125 mg Oral BID WC  . digoxin  0.0625 mg Oral Daily  . ferrous sulfate  325 mg Oral TID WC  . furosemide  80 mg Intravenous TID  . gabapentin  300 mg Oral BID  . Influenza vac split quadrivalent PF  0.5 mL Intramuscular Tomorrow-1000  . insulin aspart  0-5 Units Subcutaneous QHS  . insulin aspart  0-9 Units Subcutaneous TID WC  . metolazone  5 mg Oral BID  . nicotine  21 mg Transdermal Daily  . rivaroxaban  20 mg Oral QAC supper  . sodium chloride flush  3 mL Intravenous Q12H  . spironolactone  25 mg Oral Daily   Continuous Infusions: . milrinone 0.25 mcg/kg/min (01/04/16 0327)     LOS: 2 days    Time spent: 15 min    JESSICA U VANN, DO [facility] Pager [phone]  If 7PM-7AM, please contact night-coverage [email or web address] Password [email or web address] 01/04/2016, 12:07 PM

## 2016-01-04 NOTE — Progress Notes (Addendum)
Inpatient Diabetes Program Recommendations  AACE/ADA: New Consensus Statement on Inpatient Glycemic Control (2015)  Target Ranges:  Prepandial:   less than 140 mg/dL      Peak postprandial:   less than 180 mg/dL (1-2 hours)      Critically ill patients:  140 - 180 mg/dL   Results for YANCARLO, BUTRICK (MRN [id number]) as of 01/04/2016 09:10  Ref. Range 01/03/2016 05:26 01/03/2016 07:53 01/03/2016 11:25 01/03/2016 16:35 01/03/2016 21:00 01/04/2016 06:07  Glucose-Capillary Latest Ref Range: 65 - 99 mg/dL 532 (H) 992 (H) 426 (H) 298 (H) 195 (H) 188 (H)  Results for MCCLAIN, OTTINGER (MRN [id number]) as of 01/04/2016 09:10  Ref. Range 04/04/2015 14:25 11/09/2015 02:48  Hemoglobin A1C Latest Ref Range: 4.8 - 5.6 % 7.90 11.0 (H)    Current orders for Inpatient glycemic control: Novolog 0-9 units TIDAC, Novolog 0-5 units QHS  Inpatient Diabetes Program Recommendations: Patient remains with high postprandial and fasting CBG's. However, Renal Function is elevated so sensitive scale correction is appropriate.  Please consider:             Adding Novolog 3 units TIDAC of meal coverage;              Outpatient smoking cessation classes;             Outpatient DM self-management education/support classes. Note: Talked with patient regarding A1C, diet, exercise, and medications.  Ordered Living Well with Diabetes book.  Thank you,  Kristine Linea, RN, BSN Diabetes Coordinator Inpatient Diabetes Program [phone] (Team Pager) [phone] ([facility] office) [phone] [facility] office) [phone] [facility] office)

## 2016-01-04 NOTE — Progress Notes (Signed)
Advanced Heart Failure Rounding Note   Subjective:    Admitted with marked volume overload.   Brisk diuresis with lasix 80 mg IV tid + metolazone 5 mg twice daily + milrinone 0.25 mcg. Weight down 5 pounds. Creatinine unchanged 1.8.   Thirsty. SOB with exertion.   Objective:   Weight Range:  Vital Signs:   Temp:  [97.6 F (36.4 C)-98.3 F (36.8 C)] 98 F (36.7 C) (10/12 0434) Pulse Rate:  [70-112] 111 (10/12 0434) Resp:  [18-20] 18 (10/12 0434) BP: (98-115)/(60-67) 115/60 (10/12 0434) SpO2:  [95 %-96 %] 96 % (10/12 0434) Weight:  [227 lb 6.4 oz (103.1 kg)] 227 lb 6.4 oz (103.1 kg) (10/12 0434) Last BM Date: 01/02/16  Weight change: Filed Weights   01/02/16 0515 01/03/16 0519 01/04/16 0434  Weight: 231 lb 6.4 oz (105 kg) 232 lb 3.2 oz (105.3 kg) 227 lb 6.4 oz (103.1 kg)    Intake/Output:   Intake/Output Summary (Last 24 hours) at 01/04/16 0757 Last data filed at 01/04/16 0700  Gross per 24 hour  Intake          1753.89 ml  Output             5950 ml  Net         -4196.11 ml     Physical Exam: General:  Chronically ill appearing. No resp difficulty. In the chair  HEENT: normal Neck: supple. JVP to ear  . Carotids 2+ bilat; no bruits. No lymphadenopathy or thryomegaly appreciated. Cor: PMI nondisplaced. Irregular rate & rhythm. No rubs, gallops or murmurs. Lungs: RML LLL crackles.  Abdomen: soft, nontender, + distended. No hepatosplenomegaly. No bruits or masses. Good bowel sounds. Extremities: no cyanosis, clubbing, rash, R and LLE 3+ edema Neuro: alert & orientedx3, cranial nerves grossly intact. moves all 4 extremities w/o difficulty. Affect pleasant  Telemetry:  A fib 110s   Labs: Basic Metabolic Panel:  Recent Labs Lab 01/01/16 1554 01/02/16 0840 01/03/16 0344 01/04/16 0304  NA 133* 135 130* 128*  K 5.7* 4.0 3.8 4.7  CL 97* 96* 90* 88*  CO2 26 27 29 28   GLUCOSE 161* 197* 196* 257*  BUN 35* 36* 40* 44*  CREATININE 1.61* 1.83* 1.88* 1.80*    CALCIUM 8.3* 8.6* 8.5* 8.7*  MG  --   --   --  2.0    Liver Function Tests:  Recent Labs Lab 01/01/16 1554 01/02/16 0840  AST 49* 17  ALT 16* 11*  ALKPHOS 160* 148*  BILITOT 2.2* 1.7*  PROT 7.2 7.0  ALBUMIN 3.4* 3.3*   No results for input(s): LIPASE, AMYLASE in the last 168 hours. No results for input(s): AMMONIA in the last 168 hours.  CBC:  Recent Labs Lab 01/01/16 1554 01/02/16 0840  WBC 7.4 8.5  NEUTROABS 5.3 6.4  HGB 12.5* 12.3*  HCT 41.1 43.3  MCV 84.2 85.2  PLT 259 234    Cardiac Enzymes:  Recent Labs Lab 01/01/16 1554 01/01/16 2111 01/02/16 0228 01/02/16 0840  TROPONINI 0.05* 0.05* 0.04* 0.05*    BNP: BNP (last 3 results)  Recent Labs  08/15/15 1447 11/08/15 1450 01/01/16 1603  BNP 546.6* 748.2* 568.6*    ProBNP (last 3 results) No results for input(s): PROBNP in the last 8760 hours.    Other results:  Imaging: No results found.   Medications:     Scheduled Medications: . amiodarone  200 mg Oral BID  . arformoterol  15 mcg Nebulization BID  . atorvastatin  40 mg Oral q1800  . budesonide (PULMICORT) nebulizer solution  0.5 mg Nebulization BID  . carvedilol  3.125 mg Oral BID WC  . digoxin  0.0625 mg Oral Daily  . ferrous sulfate  325 mg Oral TID WC  . furosemide  80 mg Intravenous TID  . gabapentin  300 mg Oral BID  . Influenza vac split quadrivalent PF  0.5 mL Intramuscular Tomorrow-1000  . insulin aspart  0-5 Units Subcutaneous QHS  . insulin aspart  0-9 Units Subcutaneous TID WC  . metolazone  5 mg Oral BID  . nicotine  21 mg Transdermal Daily  . rivaroxaban  20 mg Oral QAC supper  . sodium chloride flush  3 mL Intravenous Q12H  . spironolactone  25 mg Oral Daily    Infusions: . milrinone 0.25 mcg/kg/min (01/04/16 0327)    PRN Medications: sodium chloride, albuterol, ipratropium-albuterol, ondansetron (ZOFRAN) IV, sodium chloride flush   Assessment:  1. Acute on chronic systolic HF    --echo 8/17 EF  25-30% 2. Chronic Afib 3. DM2 4. H/o GI bleed 5. Tobacco abuse 6. CAD s/p CABG in 2014 at [facility] 7. 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 Non-compliance   Plan/Discussion:    Marked volume overload in the setting of medication noncompliance.   Brisk diuresis over the last 24 hours. Volume status slowly improving. Continue  lasix to 80 mg three times daily + metolazone 5 mg twice daily+ milrinone 0.25 mcg. Renal function ok.   Remains in A Fib RVR. A fib chronic for him. Continue amiodarone 200 mg twice daily. On xarelto.   Consult cardiac rehab.    Length of Stay: 2  Amy Clegg NP-C  01/04/2016, 7:57 AM  Advanced Heart Failure Team Pager [phone] (M-F; 7a - 4p)  Please contact [facility] for night-coverage after hours (4p -7a ) and weekends on [email or web address]  Patient seen with NP, agree with the above note.  He is still volume overloaded on exam.  Continue current Lasix + metolazone regimen today, follow creatinine closely.  So far, diuresing well.  Marca Ancona 01/04/2016 3:59 PM

## 2016-01-05 LAB — GLUCOSE, CAPILLARY
GLUCOSE-CAPILLARY: 151 mg/dL — AB (ref 65–99)
Glucose-Capillary: 347 mg/dL — ABNORMAL HIGH (ref 65–99)
Glucose-Capillary: 194 mg/dL — ABNORMAL HIGH (ref 65–99)
Glucose-Capillary: 186 mg/dL — ABNORMAL HIGH (ref 65–99)

## 2016-01-05 LAB — BASIC METABOLIC PANEL
Anion gap: 12 (ref 5–15)
BUN: 44 mg/dL — ABNORMAL HIGH (ref 6–20)
CALCIUM: 9.2 mg/dL (ref 8.9–10.3)
CHLORIDE: 85 mmol/L — AB (ref 101–111)
CO2: 32 mmol/L (ref 22–32)
CREATININE: 1.88 mg/dL — AB (ref 0.61–1.24)
GFR, EST AFRICAN AMERICAN: 44 mL/min — AB (ref >60)
GFR, EST NON AFRICAN AMERICAN: 38 mL/min — AB (ref >60)
Glucose, Bld: 188 mg/dL — ABNORMAL HIGH (ref 65–99)
Potassium: 4.7 mmol/L (ref 3.5–5.1)
SODIUM: 129 mmol/L — AB (ref 135–145)

## 2016-01-05 MED ORDER — INSULIN STARTER KIT- SYRINGES (ENGLISH)
1.0000 | Freq: Once | Status: AC
  Administered 2016-01-05: 1
  Filled 2016-01-05: qty 1

## 2016-01-05 NOTE — Care Management Important Message (Signed)
Important Message  Patient Details  Name: Jacob [name] MRN: [id number] Date of Birth: Jun 23, 1955   Medicare Important Message Given:  Yes    Jacob [name] [name] 01/05/2016, 10:14 AM

## 2016-01-05 NOTE — Progress Notes (Signed)
Advanced Heart Failure Rounding Note   Subjective:    Admitted with marked volume overload.   Brisk diuresis with lasix 80 mg IV tid + metolazone 5 mg twice daily + milrinone 0.25 mcg. Weight down another 7 pounds.   SOB with exertion. Denies CP.     Objective:   Weight Range:  Vital Signs:   Temp:  [97.8 F (36.6 C)-98.1 F (36.7 C)] 97.8 F (36.6 C) (10/13 0437) Pulse Rate:  [108-111] 110 (10/13 0437) Resp:  [18-20] 20 (10/13 0437) BP: (97-117)/(61-67) 117/67 (10/13 0437) SpO2:  [95 %-97 %] 95 % (10/13 0437) Weight:  [220 lb 11.2 oz (100.1 kg)] 220 lb 11.2 oz (100.1 kg) (10/13 0437) Last BM Date: 01/02/16  Weight change: Filed Weights   01/03/16 0519 01/04/16 0434 01/05/16 0437  Weight: 232 lb 3.2 oz (105.3 kg) 227 lb 6.4 oz (103.1 kg) 220 lb 11.2 oz (100.1 kg)    Intake/Output:   Intake/Output Summary (Last 24 hours) at 01/05/16 0742 Last data filed at 01/05/16 0071  Gross per 24 hour  Intake          1753.11 ml  Output             4050 ml  Net         -2296.89 ml     Physical Exam: General:  Chronically ill appearing. No resp difficulty. In the chair  HEENT: normal Neck: supple. JVP to jaw  . Carotids 2+ bilat; no bruits. No lymphadenopathy or thryomegaly appreciated. Cor: PMI nondisplaced. Irregular rate & rhythm. No rubs, gallops or murmurs. Lungs: RML LLL crackles.  Abdomen: soft, nontender, + distended. No hepatosplenomegaly. No bruits or masses. Good bowel sounds. Extremities: no cyanosis, clubbing, rash, R and LLE 3+ edema Neuro: alert & orientedx3, cranial nerves grossly intact. moves all 4 extremities w/o difficulty. Affect pleasant  Telemetry:  A fib 110s   Labs: Basic Metabolic Panel:  Recent Labs Lab 01/01/16 1554 01/02/16 0840 01/03/16 0344 01/04/16 0304 01/05/16 0343  NA 133* 135 130* 128* 129*  K 5.7* 4.0 3.8 4.7 4.7  CL 97* 96* 90* 88* 85*  CO2 26 27 29 28  32  GLUCOSE 161* 197* 196* 257* 188*  BUN 35* 36* 40* 44* 44*    CREATININE 1.61* 1.83* 1.88* 1.80* 1.88*  CALCIUM 8.3* 8.6* 8.5* 8.7* 9.2  MG  --   --   --  2.0  --     Liver Function Tests:  Recent Labs Lab 01/01/16 1554 01/02/16 0840  AST 49* 17  ALT 16* 11*  ALKPHOS 160* 148*  BILITOT 2.2* 1.7*  PROT 7.2 7.0  ALBUMIN 3.4* 3.3*   No results for input(s): LIPASE, AMYLASE in the last 168 hours. No results for input(s): AMMONIA in the last 168 hours.  CBC:  Recent Labs Lab 01/01/16 1554 01/02/16 0840  WBC 7.4 8.5  NEUTROABS 5.3 6.4  HGB 12.5* 12.3*  HCT 41.1 43.3  MCV 84.2 85.2  PLT 259 234    Cardiac Enzymes:  Recent Labs Lab 01/01/16 1554 01/01/16 2111 01/02/16 0228 01/02/16 0840  TROPONINI 0.05* 0.05* 0.04* 0.05*    BNP: BNP (last 3 results)  Recent Labs  08/15/15 1447 11/08/15 1450 01/01/16 1603  BNP 546.6* 748.2* 568.6*    ProBNP (last 3 results) No results for input(s): PROBNP in the last 8760 hours.    Other results:  Imaging: No results found.   Medications:     Scheduled Medications: . amiodarone  200 mg  Oral BID  . arformoterol  15 mcg Nebulization BID  . atorvastatin  40 mg Oral q1800  . budesonide (PULMICORT) nebulizer solution  0.5 mg Nebulization BID  . carvedilol  3.125 mg Oral BID WC  . digoxin  0.0625 mg Oral Daily  . ferrous sulfate  325 mg Oral TID WC  . furosemide  80 mg Intravenous TID  . gabapentin  300 mg Oral BID  . Influenza vac split quadrivalent PF  0.5 mL Intramuscular Tomorrow-1000  . insulin aspart  0-5 Units Subcutaneous QHS  . insulin aspart  0-9 Units Subcutaneous TID WC  . metolazone  5 mg Oral BID  . nicotine  21 mg Transdermal Daily  . rivaroxaban  20 mg Oral QAC supper  . sodium chloride flush  3 mL Intravenous Q12H  . spironolactone  25 mg Oral Daily    Infusions: . milrinone 0.25 mcg/kg/min (01/05/16 [id number])    PRN Medications: sodium chloride, albuterol, ipratropium-albuterol, ondansetron (ZOFRAN) IV, sodium chloride flush   Assessment:  1.  Acute on chronic systolic HF    --echo 8/17 EF 84-13%25-30% 2. Chronic Afib 3. DM2 4. H/o GI bleed 5. Tobacco abuse 6. [facility] 7. 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 Non-compliance 9. Hyponatremia   Plan/Discussion:    Marked volume overload in the setting of medication noncompliance.   Brisk diuresis over the last 24 hours. Overall weight down 12 pounds. Volume status improving. Continue  lasix to 80 mg three times daily + metolazone 5 mg twice daily+ milrinone 0.25 mcg. Once fully diuresed anticipate transitioning to torsemide 80 mg twice a day. Renal function ok. Check dig level in am.   Remains in atrial fibrillation. A fib chronic for him. Continue amiodarone 200 mg twice daily. On xarelto.   Consult cardiac rehab.   Length of Stay: 3  Amy Clegg NP-C  01/05/2016, 7:42 AM  Advanced Heart Failure Team Pager [phone] (M-F; 7a - 4p)  Please contact [facility] for night-coverage after hours (4p -7a ) and weekends on [email or web address]  Patient seen with NP, agree with the above note.  He is diuresing well.  He remains volume overloaded on exam.  Creatinine is stable.  - Continue milrinone + IV Lasix + metolazone today.  Reassess in am, may need a couple more days of diuresis.   Marca AnconaDalton Zana Biancardi 01/05/2016 9:01 AM

## 2016-01-05 NOTE — Evaluation (Signed)
Physical Therapy Evaluation Patient Details Name: Jacob [name] MRN: [id number] DOB: 02-Mar-1956 Today's Date: 01/05/2016   History of Present Illness  Patient is a 60 yo male admitted 01/01/16 with dyspnea, chest pain, LE edema, increased weight.  Patient with CHF exacerbation.    PMH:  Afib, HTN, CHF, EF 25-35%, CAD, MI, CABG, pacemaker, DM, COPD, obesity    Clinical Impression  Patient presents with problems listed below.  Will benefit from acute PT to maximize functional independence prior to discharge home with prn assist.  Patient independent and working pta.  Today required assist for mobility due to decreased strength and balance.  Able to ambulate 200' with RW and min guard assist.  Feel patient will progress to no assistive device prior to discharge.  Do not anticipate any f/u PT needs at d/c.  Per Cardiac Rehab staff, patient agreed to Cardiac Rehab Phase II program.    Follow Up Recommendations No PT follow up;Supervision - Intermittent (Agree with Cardiac Rehab Phase II program)    Equipment Recommendations  Rolling walker with 5" wheels (May progress past need for RW. Continue to assess)    Recommendations for Other Services       Precautions / Restrictions Precautions Precautions: Fall Restrictions Weight Bearing Restrictions: No      Mobility  Bed Mobility               General bed mobility comments: Patient in chair  Transfers Overall transfer level: Needs assistance Equipment used: Rolling walker (2 wheeled);None Transfers: Sit to/from Stand Sit to Stand: Min guard         General transfer comment: Verbal cues for hand placement and technique.  Assist for safety only to stand.  While returning to chair without RW, patient started to sit as he was turning toward chair.  With head down, lost his balance.  Was able to stagger to help regain balance.  Instructed patient on safe stand > sit transfer technique.  Ambulation/Gait Ambulation/Gait assistance:  Min guard Ambulation Distance (Feet): 200 Feet Assistive device: Rolling walker (2 wheeled);None Gait Pattern/deviations: Step-through pattern;Decreased stride length   Gait velocity interpretation: Below normal speed for age/gender General Gait Details: Patient demonstrated safe use of RW.  No physical assist needed.  Patient ambulated last 8030' with no assistive device.  Good balance on level surface going forward.  Loss of balance during turns and transfers with no AD.  Able to stagger and prevent fall.  Assist for safety.  Stairs            Wheelchair Mobility    Modified Rankin (Stroke Patients Only)       Balance Overall balance assessment: Needs assistance         Standing balance support: No upper extremity supported;During functional activity Standing balance-Leahy Scale: Good                               Pertinent Vitals/Pain Pain Assessment: Faces Faces Pain Scale: Hurts a little bit Pain Location: BLE's Pain Descriptors / Indicators: Aching;Sore Pain Intervention(s): Monitored during session;Repositioned    Home Living Family/patient expects to be discharged to:: Private residence Living Arrangements: Alone Available Help at Discharge: Family;Friend(s);Available PRN/intermittently Type of Home: Apartment Home Access: Level entry     Home Layout: One level Home Equipment: None      Prior Function Level of Independence: Independent         Comments: Patient works at [employer].  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     Hand Dominance        Extremity/Trunk Assessment   Upper Extremity Assessment: Overall WFL for tasks assessed           Lower Extremity Assessment: Generalized weakness (Edema noted BLE's)         Communication   Communication: No difficulties  Cognition Arousal/Alertness: Awake/alert Behavior During Therapy: WFL for tasks assessed/performed Overall Cognitive Status: Within Functional  Limits for tasks assessed                      General Comments      Exercises     Assessment/Plan    PT Assessment Patient needs continued PT services  PT Problem List Decreased strength;Decreased balance;Decreased mobility;Cardiopulmonary status limiting activity;Obesity          PT Treatment Interventions DME instruction;Gait training;Functional mobility training;Therapeutic activities;Therapeutic exercise;Balance training;Patient/family education    PT Goals (Current goals can be found in the Care Plan section)  Acute Rehab PT Goals Patient Stated Goal: To walk more PT Goal Formulation: With patient Time For Goal Achievement: 01/12/16 Potential to Achieve Goals: Good    Frequency Min 3X/week   Barriers to discharge Decreased caregiver support Patient lives alone    Co-evaluation               End of Session Equipment Utilized During Treatment: Gait belt Activity Tolerance: Patient tolerated treatment well Patient left: in chair;with call bell/phone within reach Nurse Communication: Mobility status (RN off floor.  Talked with NT - patient may ask to walk hall)         Time: 1324-4010 PT Time Calculation (min) (ACUTE ONLY): 14 min   Charges:   PT Evaluation $PT Eval Moderate Complexity: 1 Procedure     PT G Codes:        Vena Austria Feb 02, 2016, 6:06 PM Durenda Hurt. Renaldo Fiddler, [facility] Acute Rehab Services Pager [phone]

## 2016-01-05 NOTE — Progress Notes (Signed)
PROGRESS NOTE    Jacob [name]  WUJ:[id number] DOB: [birth year] DOA: 01/01/2016  PCP: [name], MD   Brief Narrative:  60 y.o. 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 He complains of substernal chest pain that he reports is similar to previous pain.  Associated symptoms include weight gain of 12 pounds, swelling of the legs/abdomen, PND, dyspnea on exertion, sleeps with 2-3 pillows, and orthopnea. Patient reports that he takes his medications as prescribed. However, patient then states that he has been out of digoxin and Bidil for at least 2 weeks now.  Patient's followed as an outpatient setting by Dr. Gala Romney. Last hospitalization was 8/16- 8/22 at [facility] with similar symptoms, and reported to be out of medications.  Assessment & Plan:  Acute on chronic combined systolic and diastolic CHF: Patient presented with an estimated weight gain of 12 pounds in the last week. Last EF 25-30% in 10/2015 with diffuse hypokinesis. - Strict I&Os and daily weights  - per CHF team: Started on milrinone 0.25 mcg + 100 mg IV lasix twice daily + metolazone - Continue Coreg and spironolactone - Care management for assistance with medications, patient is non-compliant  Atrial fibrillation on chronic anticoagulation therapy - continue to Xarelto, Amiodarone - added back on digoxin  Elevated troponin level/chest pain - Add back on Bidil, but consider issues with patient obtaining this medication - follow up on cardiology recommendations   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 - Continue to monitor  - no chest pain this AM  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 Patient presents with a creatinine of 1.63 and a BUN of 35. - BMP in AM  Elevated transaminases - Elevated AST and AP. Suspect secondary to passive congestion from CHF exacerbation. - Continue to monitor   Tobacco abuse  - Nicotine patch - Counseled on the need of cessation of tobacco use by Dr. Benjamine Mola   Morbid obesity - pt meets criteria for morbid obesity with BMI > 35 and underlying DM, HTN, CAD - Body mass index is 36.73 kg/m.  DVT prophylaxis:  SCD's  Code Status: Full Code  Family Communication:  Patient   Disposition Plan:   To be determined   Consultants:   Cards   Subjective: No concerns this AM.   Objective: Vitals:   01/05/16 0437 01/05/16 0806 01/05/16 0850 01/05/16 1124  BP: 117/67  107/64 114/62  Pulse: (!) 110 (!) 111 (!) 108 (!) 107  Resp: 20 (!) 24  18  Temp: 97.8 F (36.6 C)   97.6 F (36.4 C)  TempSrc: Oral   Oral  SpO2: 95% 94%  96%  Weight: 100.1 kg (220 lb 11.2 oz)     Height:        Intake/Output Summary (Last 24 hours) at 01/05/16 1307 Last data filed at 01/05/16 1124  Gross per 24 hour  Intake          1993.11 ml  Output  3825 ml  Net         -1831.89 ml   Filed Weights   01/03/16 0519 01/04/16 0434 01/05/16 0437  Weight: 105.3 kg (232 lb 3.2 oz) 103.1 kg (227 lb 6.4 oz) 100.1 kg (220 lb 11.2 oz)    Examination:  General exam: chronically ill apearring-- obese Respiratory system: diminished- crackles Cardiovascular system: iRR. No JVD, murmurs, rubs, gallops or clicks. No pedal edema. Gastrointestinal system: Abdomen is nondistended, soft and nontender. No organomegaly or masses felt.   Data Reviewed: I have personally reviewed following labs and imaging  studies  CBC:  Recent Labs Lab 01/01/16 1554 01/02/16 0840  WBC 7.4 8.5  NEUTROABS 5.3 6.4  HGB 12.5* 12.3*  HCT 41.1 43.3  MCV 84.2 85.2  PLT 259 234   Basic Metabolic Panel:  Recent Labs Lab 01/01/16 1554 01/02/16 0840 01/03/16 0344 01/04/16 0304 01/05/16 0343  NA 133* 135 130* 128* 129*  K 5.7* 4.0 3.8 4.7 4.7  CL 97* 96* 90* 88* 85*  CO2 26 27 29 28  32  GLUCOSE 161* 197* 196* 257* 188*  BUN 35* 36* 40* 44* 44*  CREATININE 1.61* 1.83* 1.88* 1.80* 1.88*  CALCIUM 8.3* 8.6* 8.5* 8.7* 9.2  MG  --   --   --  2.0  --    Liver Function Tests:  Recent Labs Lab 01/01/16 1554 01/02/16 0840  AST 49* 17  ALT 16* 11*  ALKPHOS 160* 148*  BILITOT 2.2* 1.7*  PROT 7.2 7.0  ALBUMIN 3.4* 3.3*   Cardiac Enzymes:  Recent Labs Lab 01/01/16 1554 01/01/16 2111 01/02/16 0228 01/02/16 0840  TROPONINI 0.05* 0.05* 0.04* 0.05*   CBG:  Recent Labs Lab 01/04/16 1143 01/04/16 1655 01/04/16 2037 01/05/16 0614 01/05/16 1122  GLUCAP 393* 178* 253* 151* 347*   Urine analysis:    Component Value Date/Time   COLORURINE YELLOW 12/01/2014 1300   APPEARANCEUR CLEAR 12/01/2014 1300   LABSPEC 1.009 12/01/2014 1300   PHURINE 5.5 12/01/2014 1300   GLUCOSEU NEGATIVE 12/01/2014 1300   HGBUR NEGATIVE 12/01/2014 1300   BILIRUBINUR NEGATIVE 12/01/2014 1300   KETONESUR NEGATIVE 12/01/2014 1300   PROTEINUR NEGATIVE 12/01/2014 1300   UROBILINOGEN 1.0 12/01/2014 1300   NITRITE NEGATIVE 12/01/2014 1300   LEUKOCYTESUR NEGATIVE 12/01/2014 1300    Anti-infectives    None     Radiology Studies: No results found.  Scheduled Meds: . amiodarone  200 mg Oral BID  . arformoterol  15 mcg Nebulization BID  . atorvastatin  40 mg Oral q1800  . budesonide (PULMICORT) nebulizer solution  0.5 mg Nebulization BID  . carvedilol  3.125 mg Oral BID WC  . digoxin  0.0625 mg Oral Daily  . ferrous sulfate  325 mg Oral TID WC  . furosemide  80 mg Intravenous TID  . gabapentin  300 mg Oral  BID  . Influenza vac split quadrivalent PF  0.5 mL Intramuscular Tomorrow-1000  . insulin aspart  0-5 Units Subcutaneous QHS  . insulin aspart  0-9 Units Subcutaneous TID WC  . metolazone  5 mg Oral BID  . nicotine  21 mg Transdermal Daily  . rivaroxaban  20 mg Oral QAC supper  . sodium chloride flush  3 mL Intravenous Q12H  . spironolactone  25 mg Oral Daily   Continuous Infusions: . milrinone 0.25 mcg/kg/min (01/05/16 5170)     LOS: 3 days    Time spent: 15 min    Debbora Presto, DO [facility] Pager [phone]  If 7PM-7AM, please  contact night-coverage [email or web address] Password [email or web address] 01/05/2016, 1:07 PM

## 2016-01-05 NOTE — Progress Notes (Signed)
CARDIAC REHAB PHASE I   PRE:  Rate/Rhythm: 107   BP:  Sitting: 106/70        SaO2: 96 RA  MODE:  Ambulation: 230 ft   POST:  Rate/Rhythm: 110   BP:  Sitting: 96/66         SaO2: 95 RA  Pt up in recliner, ready to walk. Pt ambulated 320 ft on RA, rolling walker, hand held assist, steady gait, tolerated fairly well. Pt c/o DOE, general fatigue, aching in his legs, brief standing rest x3. Pt returned to room, approximately 2 feet from the recliner, pt stated his legs felt weak, c/o sudden onset dizziness and attempted to sit before reaching the recliner, additional staff called to assist, pt assisted/lifted to the recliner with staff assistance. Pt did not fall, however, he did require significant assistance +2 to return to chair. VSS. RN notified. Reviewed CHF education, pt able to perform teach back, admits to non-compliance, states "I love to drink water all day." Strongly encouraged compliance. Pt agrees to phase 2 cardiac rehab referral, will send to [facility] per pt request. Pt to recliner after walk, call bell within reach. In the future, would ambulate as x2 assist with gait belt, follow with chair. Will follow.   [phone] Joylene Grapes, RN, BSN 01/05/2016 2:32 PM

## 2016-01-05 NOTE — Progress Notes (Addendum)
Inpatient Diabetes Program Recommendations  AACE/ADA: New Consensus Statement on Inpatient Glycemic Control (2015)  Target Ranges:  Prepandial:   less than 140 mg/dL      Peak postprandial:   less than 180 mg/dL (1-2 hours)      Critically ill patients:  140 - 180 mg/dL   Results for Jacob [name], Jacob [name] (MRN [id number]) as of 01/05/2016 08:44  Ref. Range 01/04/2016 06:07 01/04/2016 11:43 01/04/2016 16:55 01/04/2016 20:37 01/05/2016 06:14  Glucose-Capillary Latest Ref Range: 65 - 99 mg/dL 188 (H) 393 (H) 178 (H) 253 (H) 151 (H)  Results for Jacob [name], Jacob [name] (MRN [id number]) as of 01/05/2016 08:44  Ref. Range 11/09/2015 02:48  Hemoglobin A1C Latest Ref Range: 4.8 - 5.6 % 11.0 (H)    Current orders for Inpatient glycemic control: Novolog 0-9 units TIDAC, Novolog 0-5 units QHS  Inpatient Diabetes Program Recommendations: Patient remains with high postprandial and fasting CBG's. However, Renal Function is elevated so sensitive scale correction is appropriate.  Please consider: Adding Novolog 3 units TIDAC of meal coverage;  Outpatient smoking cessation classes; Outpatient DM self-management education/support classes.  NOTE: Again visited patient to discuss likelihood of needing Insulin upon discharge.  Patient stated he refused to take insulin, learn how to give self insulin, and refused to discuss possibility of anything besides oral medications.  This information was then communicated to RN caring for patient by this RN.    Diabetes Coordinator Consultation Assessment:  Ineffective self-management of diabetes  Goals to be met by discharge: 1.  Patient will identify plan for self-management of Diabetes care management needs. 2.  Patient will schedule a follow up appoint with PCP  Interventions: 1.  Nursing will contiune to teach patient the following (teach back and/or return demonstration):      When to call MD      A1C      Sick day rules  Hypo/Hyperglycemia      Medications at D/C (what these are, why taking, when taking, how taking, common S.E.'s)      CBG monitoring      How/why to check feet every day      Why exercise is important      Carb modified diet  2.  Barriers and facilitators to self-management goals: patient currently not following carb modified diet, not ambulating in the hospital, not wanting to take insulin, only taking CBG's once a day, currently has an apartment (nio longer homeless), has MEDICARE, and has taken oral medications.  3.  Support systems: No family present when this RN spoke with patient over past 2 days, RN caring for patient states that family members have been at hospital sporatically.    Thank you,  Windy Carina, RN, BSN Diabetes Coordinator Inpatient Diabetes Program [phone] (Team Pager) [phone] ([facility] office) [phone] [facility] office) [phone] [facility] office)

## 2016-01-06 LAB — GLUCOSE, CAPILLARY
GLUCOSE-CAPILLARY: 184 mg/dL — AB (ref 65–99)
GLUCOSE-CAPILLARY: 362 mg/dL — AB (ref 65–99)
GLUCOSE-CAPILLARY: 278 mg/dL — AB (ref 65–99)
Glucose-Capillary: 258 mg/dL — ABNORMAL HIGH (ref 65–99)

## 2016-01-06 LAB — CBC
HCT: 43.1 % (ref 39.0–52.0)
Hemoglobin: 13.3 g/dL (ref 13.0–17.0)
MCH: 24.7 pg — ABNORMAL LOW (ref 26.0–34.0)
MCHC: 30.9 g/dL (ref 30.0–36.0)
MCV: 80.1 fL (ref 78.0–100.0)
PLATELETS: 236 10*3/uL (ref 150–400)
RBC: 5.38 MIL/uL (ref 4.22–5.81)
RDW: 18.4 % — AB (ref 11.5–15.5)
WBC: 9.1 10*3/uL (ref 4.0–10.5)

## 2016-01-06 LAB — BASIC METABOLIC PANEL
ANION GAP: 13 (ref 5–15)
BUN: 49 mg/dL — AB (ref 6–20)
CALCIUM: 9.3 mg/dL (ref 8.9–10.3)
CO2: 32 mmol/L (ref 22–32)
Chloride: 82 mmol/L — ABNORMAL LOW (ref 101–111)
Creatinine, Ser: 1.8 mg/dL — ABNORMAL HIGH (ref 0.61–1.24)
GFR calc Af Amer: 46 mL/min — ABNORMAL LOW (ref >60)
GFR, EST NON AFRICAN AMERICAN: 40 mL/min — AB (ref >60)
GLUCOSE: 173 mg/dL — AB (ref 65–99)
POTASSIUM: 4.5 mmol/L (ref 3.5–5.1)
SODIUM: 127 mmol/L — AB (ref 135–145)

## 2016-01-06 LAB — DIGOXIN LEVEL: Digoxin Level: 0.7 ng/mL — ABNORMAL LOW (ref 0.8–2.0)

## 2016-01-06 MED ORDER — INSULIN ASPART 100 UNIT/ML ~~LOC~~ SOLN: 0.0000 [IU] | Freq: Three times a day (TID) | SUBCUTANEOUS | Status: DC

## 2016-01-06 MED ORDER — INSULIN ASPART 100 UNIT/ML ~~LOC~~ SOLN
3.0000 [IU] | Freq: Three times a day (TID) | SUBCUTANEOUS | Status: DC
  Administered 2016-01-06 – 2016-01-07 (×2): 3 [IU] via SUBCUTANEOUS

## 2016-01-06 NOTE — Progress Notes (Signed)
Spoke to patient regarding Insulin management at home, pt very hesitant to start educated on reasons for starting insulin. Pt stated he will think about it.

## 2016-01-06 NOTE — Progress Notes (Signed)
Patient ID: Jacob [name], male   DOB: 1955/05/15, 60 y.o.   MRN: [id number]    Advanced Heart Failure Rounding Note   Subjective:    Admitted with marked volume overload.   Brisk diuresis with lasix 80 mg IV tid + metolazone 5 mg twice daily + milrinone 0.25 mcg. Weight down another 4 pounds.   SOB with exertion. Denies CP.     Objective:   Weight Range:  Vital Signs:   Temp:  [97.3 F (36.3 C)-98.3 F (36.8 C)] 98.2 F (36.8 C) (10/14 1139) Pulse Rate:  [97-111] 105 (10/14 1139) Resp:  [16-18] 16 (10/14 1139) BP: (96-130)/(57-74) 96/57 (10/14 1139) SpO2:  [94 %-98 %] 98 % (10/14 1139) Weight:  [216 lb 4.8 oz (98.1 kg)] 216 lb 4.8 oz (98.1 kg) (10/14 0501) Last BM Date: 01/05/16  Weight change: Filed Weights   01/04/16 0434 01/05/16 0437 01/06/16 0501  Weight: 227 lb 6.4 oz (103.1 kg) 220 lb 11.2 oz (100.1 kg) 216 lb 4.8 oz (98.1 kg)    Intake/Output:   Intake/Output Summary (Last 24 hours) at 01/06/16 1223 Last data filed at 01/06/16 0900  Gross per 24 hour  Intake             1058 ml  Output             4150 ml  Net            -3092 ml     Physical Exam: General:  Chronically ill appearing. No resp difficulty. In the chair  HEENT: normal Neck: supple. JVP to 14-16 cm. Carotids 2+ bilat; no bruits. No lymphadenopathy or thryomegaly appreciated. Cor: PMI lateral. Mildly tachy, regular rate & rhythm. No rubs, gallops or murmurs. Lungs: RML LLL crackles.  Abdomen: soft, nontender, + distended. No hepatosplenomegaly. No bruits or masses. Good bowel sounds. Extremities: no cyanosis, clubbing, rash, 2+ edema to knees bilaterally.  Neuro: alert & orientedx3, cranial nerves grossly intact. moves all 4 extremities w/o difficulty. Affect pleasant  Telemetry:  ?atypical atrial flutter HR 100s.    Labs: Basic Metabolic Panel:  Recent Labs Lab 01/02/16 0840 01/03/16 0344 01/04/16 0304 01/05/16 0343 01/06/16 0546  NA 135 130* 128* 129* 127*  K 4.0 3.8 4.7 4.7 4.5   CL 96* 90* 88* 85* 82*  CO2 27 29 28  32 32  GLUCOSE 197* 196* 257* 188* 173*  BUN 36* 40* 44* 44* 49*  CREATININE 1.83* 1.88* 1.80* 1.88* 1.80*  CALCIUM 8.6* 8.5* 8.7* 9.2 9.3  MG  --   --  2.0  --   --     Liver Function Tests:  Recent Labs Lab 01/01/16 1554 01/02/16 0840  AST 49* 17  ALT 16* 11*  ALKPHOS 160* 148*  BILITOT 2.2* 1.7*  PROT 7.2 7.0  ALBUMIN 3.4* 3.3*   No results for input(s): LIPASE, AMYLASE in the last 168 hours. No results for input(s): AMMONIA in the last 168 hours.  CBC:  Recent Labs Lab 01/01/16 1554 01/02/16 0840 01/06/16 0546  WBC 7.4 8.5 9.1  NEUTROABS 5.3 6.4  --   HGB 12.5* 12.3* 13.3  HCT 41.1 43.3 43.1  MCV 84.2 85.2 80.1  PLT 259 234 236    Cardiac Enzymes:  Recent Labs Lab 01/01/16 1554 01/01/16 2111 01/02/16 0228 01/02/16 0840  TROPONINI 0.05* 0.05* 0.04* 0.05*    BNP: BNP (last 3 results)  Recent Labs  08/15/15 1447 11/08/15 1450 01/01/16 1603  BNP 546.6* 748.2* 568.6*    ProBNP (last  3 results) No results for input(s): PROBNP in the last 8760 hours.    Other results:  Imaging: No results found.   Medications:     Scheduled Medications: . amiodarone  200 mg Oral BID  . arformoterol  15 mcg Nebulization BID  . atorvastatin  40 mg Oral q1800  . budesonide (PULMICORT) nebulizer solution  0.5 mg Nebulization BID  . carvedilol  3.125 mg Oral BID WC  . digoxin  0.0625 mg Oral Daily  . ferrous sulfate  325 mg Oral TID WC  . furosemide  80 mg Intravenous TID  . gabapentin  300 mg Oral BID  . Influenza vac split quadrivalent PF  0.5 mL Intramuscular Tomorrow-1000  . insulin aspart  0-5 Units Subcutaneous QHS  . insulin aspart  0-9 Units Subcutaneous TID WC  . insulin aspart  3 Units Subcutaneous TID WC  . metolazone  5 mg Oral BID  . nicotine  21 mg Transdermal Daily  . rivaroxaban  20 mg Oral QAC supper  . sodium chloride flush  3 mL Intravenous Q12H  . spironolactone  25 mg Oral Daily     Infusions: . milrinone 0.25 mcg/kg/min (01/05/16 1837)    PRN Medications: sodium chloride, albuterol, ipratropium-albuterol, ondansetron (ZOFRAN) IV, sodium chloride flush   Assessment:  1. Acute on chronic systolic HF    --echo 8/17 EF 09-73% 2. Chronic Afib 3. DM2 4. H/o GI bleed 5. Tobacco abuse 6. CAD s/p CABG in 2014 at [facility] 7. 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 Non-compliance 9. Hyponatremia   Plan/Discussion:    Marked volume overload in the setting of medication noncompliance.   Ongoing brisk diuresis.  Creatinine remaining stable.  Still very volume overloaded on exam. Continue  lasix to 80 mg three times daily + metolazone 5 mg twice daily+ milrinone 0.25 mcg. Once fully diuresed anticipate transitioning to torsemide 80 mg twice a day.  I think he will need a couple more days of IV diuresis.   Digoxin level ok.   Looks like he is in atypical atrial flutter today, rate 100s.  He has chronic atrial arrhythmias (fibrillation/flutter).  Continue amiodarone 200 mg twice daily, has been on for rate control. On Xarelto.   Consult cardiac rehab.   Length of Stay: 4  Marca Ancona  01/06/2016, 12:23 PM  Advanced Heart Failure Team Pager [phone] (M-F; 7a - 4p)  Please contact [facility] for night-coverage after hours (4p -7a ) and weekends on [email or web address]

## 2016-01-06 NOTE — Progress Notes (Addendum)
PROGRESS NOTE    Bethann PunchesYahia Student  ZOX:[id number] DOB: [birth year] DOA: 01/01/2016  PCP: Jeanann LewandowskyJEGEDE, OLUGBEMIGA, MD   Brief Narrative:  60 y.o. 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 He complains of substernal chest pain that he reports is similar to previous pain.  Associated symptoms include weight gain of 12 pounds, swelling of the legs/abdomen, PND, dyspnea on exertion, sleeps with 2-3 pillows, and orthopnea. Patient reports that he takes his medications as prescribed. However, patient then states that he has been out of digoxin and Bidil for at least 2 weeks now.  Patient's followed as an outpatient setting by Dr. Gala RomneyBensimhon. Last [facility] 8/16- 8/[facility] with similar symptoms, and reported to be out of medications.  Assessment & Plan:  Acute on chronic combined systolic and diastolic CHF - Last EF 25-30% in 10/2015 with diffuse hypokinesis - volume status improving and weight trend in the past 48 hours: 227 --> 220 --> 216 lbs this AM - cardiology team following, pt currently on lasix 80 mg TID IV, metolazone 5 mg BID and Milrinone 0.25 mcg - per cardiology, once pt fully diuresed anticipate transitioning to torsemide 80 mg twice a day  Atrial fibrillation on chronic anticoagulation therapy - continue to Xarelto, Amiodarone 200 mg BID - added back on digoxin  Elevated troponin level/chest pain - Add back on Bidil, but consider issues with patient obtaining this medication - follow up on cardiology recommendations   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 - Continue to monitor  - no chest pain this AM - keep on tele   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  - holding oral regimen, pt very clear he will not take insulin on discharge so our best  option to encourage medical compliance with oral antihyperglycemics and dietary restrictions  - Continue Gabapentin  COPD, without acute exacerbation - continue nebulized budesonide and Brovana - Duonebs q 4hrs prn Sob/wheezing  Essential hypertension - continue current medication regimen: Coreg 3.125 mg BID, Lasix, Spironolactone   Chronic kidney disease stage III, hyponatremia  - Creatinine has ranged from 1.14- 1.99 over the last few months - Cr remains stable ~1.80 - BMP in AM  Elevated transaminases - Elevated AST and AP. Suspect secondary to passive congestion from CHF exacerbation. - Continue to monitor   Tobacco abuse  - Nicotine patch - Counseled on the need of cessation of tobacco use by Dr. Benjamine MolaVann   Morbid obesity - pt meets criteria for morbid obesity with BMI > 35 and underlying DM, HTN, CAD - Body mass index is 36.73 kg/m.  DVT prophylaxis:   SCD's, Xarelto  Code Status:  Full Code  Family Communication:  Patient   Disposition Plan:   To be determined, likely home when volume overload resolved   Consultants:   Cardiology   Subjective: No concerns this AM. Wants to get something for sleep tonight.   Objective: Vitals:   01/05/16 1950 01/06/16 0055 01/06/16 0501 01/06/16 0938  BP: 103/63 130/74 97/65   Pulse: 97 (!) 106 (!) 108 (!) 111  Resp: 18 18 18    Temp: 97.3 F (36.3 C) 97.6 F (36.4 C) 98.3 F (36.8 C)   TempSrc: Oral Oral Oral   SpO2: 97% 96% 94%   Weight:   98.1 kg (216 lb  4.8 oz)   Height:        Intake/Output Summary (Last 24 hours) at 01/06/16 1053 Last data filed at 01/06/16 0900  Gross per 24 hour  Intake             1058 ml  Output             4700 ml  Net            -3642 ml   Filed Weights   01/04/16 0434 01/05/16 0437 01/06/16 0501  Weight: 103.1 kg (227 lb 6.4 oz) 100.1 kg (220 lb 11.2 oz) 98.1 kg (216 lb 4.8 oz)    Examination:  General exam: chronically ill apearring-- obese Respiratory system:  crackles at bases still noted  Cardiovascular system: iRR. + JVD, no murmurs, rubs, gallops or clicks. +2 bilateral LE edema  Gastrointestinal system: Abdomen is nondistended, soft and nontender. No organomegaly or masses felt.   Data Reviewed: I have personally reviewed following labs and imaging studies  CBC:  Recent Labs Lab 01/01/16 1554 01/02/16 0840 01/06/16 0546  WBC 7.4 8.5 9.1  NEUTROABS 5.3 6.4  --   HGB 12.5* 12.3* 13.3  HCT 41.1 43.3 43.1  MCV 84.2 85.2 80.1  PLT 259 234 236   Basic Metabolic Panel:  Recent Labs Lab 01/02/16 0840 01/03/16 0344 01/04/16 0304 01/05/16 0343 01/06/16 0546  NA 135 130* 128* 129* 127*  K 4.0 3.8 4.7 4.7 4.5  CL 96* 90* 88* 85* 82*  CO2 27 29 28  32 32  GLUCOSE 197* 196* 257* 188* 173*  BUN 36* 40* 44* 44* 49*  CREATININE 1.83* 1.88* 1.80* 1.88* 1.80*  CALCIUM 8.6* 8.5* 8.7* 9.2 9.3  MG  --   --  2.0  --   --    Liver Function Tests:  Recent Labs Lab 01/01/16 1554 01/02/16 0840  AST 49* 17  ALT 16* 11*  ALKPHOS 160* 148*  BILITOT 2.2* 1.7*  PROT 7.2 7.0  ALBUMIN 3.4* 3.3*   Cardiac Enzymes:  Recent Labs Lab 01/01/16 1554 01/01/16 2111 01/02/16 0228 01/02/16 0840  TROPONINI 0.05* 0.05* 0.04* 0.05*   CBG:  Recent Labs Lab 01/05/16 0614 01/05/16 1122 01/05/16 1557 01/05/16 2055 01/06/16 0634  GLUCAP 151* 347* 194* 186* 184*   Urine analysis:    Component Value Date/Time   COLORURINE YELLOW 12/01/2014 1300   APPEARANCEUR CLEAR 12/01/2014 1300   LABSPEC 1.009 12/01/2014 1300   PHURINE 5.5 12/01/2014 1300   GLUCOSEU NEGATIVE 12/01/2014 1300   HGBUR NEGATIVE 12/01/2014 1300   BILIRUBINUR NEGATIVE 12/01/2014 1300   KETONESUR NEGATIVE 12/01/2014 1300   PROTEINUR NEGATIVE 12/01/2014 1300   UROBILINOGEN 1.0 12/01/2014 1300   NITRITE NEGATIVE 12/01/2014 1300   LEUKOCYTESUR NEGATIVE 12/01/2014 1300    Anti-infectives    None     Radiology Studies: No results found.  Scheduled Meds: . amiodarone   200 mg Oral BID  . arformoterol  15 mcg Nebulization BID  . atorvastatin  40 mg Oral q1800  . budesonide (PULMICORT) nebulizer solution  0.5 mg Nebulization BID  . carvedilol  3.125 mg Oral BID WC  . digoxin  0.0625 mg Oral Daily  . ferrous sulfate  325 mg Oral TID WC  . furosemide  80 mg Intravenous TID  . gabapentin  300 mg Oral BID  . Influenza vac split quadrivalent PF  0.5 mL Intramuscular Tomorrow-1000  . insulin aspart  0-5 Units Subcutaneous QHS  . insulin aspart  0-9 Units Subcutaneous TID WC  .  metolazone  5 mg Oral BID  . nicotine  21 mg Transdermal Daily  . rivaroxaban  20 mg Oral QAC supper  . sodium chloride flush  3 mL Intravenous Q12H  . spironolactone  25 mg Oral Daily   Continuous Infusions: . milrinone 0.25 mcg/kg/min (01/05/16 1837)     LOS: 4 days    Time spent: 15 min    MAGICK-Milta Croson, DO [facility] Pager [phone]  If 7PM-7AM, please contact night-coverage [email or web address] Password [email or web address] 01/06/2016, 10:53 AM

## 2016-01-06 NOTE — Progress Notes (Signed)
Patient still resisting Diabetic Starter kit assistance. States he only takes pills,but now starting to look @ kit, but has  not started reading and practicing. We will continue to encourage and educate him.

## 2016-01-06 NOTE — Progress Notes (Signed)
Mr Uhlig refused to walk this afternoon. "I am tired." Will follow up with the patient on Monday.Gladstone Lighter, RN,BSN 01/06/2016 1:58 PM

## 2016-01-07 LAB — GLUCOSE, CAPILLARY
Glucose-Capillary: 314 mg/dL — ABNORMAL HIGH (ref 65–99)
Glucose-Capillary: 423 mg/dL — ABNORMAL HIGH (ref 65–99)
Glucose-Capillary: 320 mg/dL — ABNORMAL HIGH (ref 65–99)
Glucose-Capillary: 142 mg/dL — ABNORMAL HIGH (ref 65–99)
Glucose-Capillary: 353 mg/dL — ABNORMAL HIGH (ref 65–99)

## 2016-01-07 LAB — BASIC METABOLIC PANEL
Anion gap: 13 (ref 5–15)
BUN: 61 mg/dL — ABNORMAL HIGH (ref 6–20)
CHLORIDE: 79 mmol/L — AB (ref 101–111)
CO2: 33 mmol/L — AB (ref 22–32)
CREATININE: 2.11 mg/dL — AB (ref 0.61–1.24)
Calcium: 9.2 mg/dL (ref 8.9–10.3)
GFR calc non Af Amer: 33 mL/min — ABNORMAL LOW (ref >60)
GFR, EST AFRICAN AMERICAN: 38 mL/min — AB (ref >60)
Glucose, Bld: 310 mg/dL — ABNORMAL HIGH (ref 65–99)
Potassium: 4.4 mmol/L (ref 3.5–5.1)
Sodium: 125 mmol/L — ABNORMAL LOW (ref 135–145)

## 2016-01-07 MED ORDER — INSULIN ASPART 100 UNIT/ML ~~LOC~~ SOLN
0.0000 [IU] | Freq: Every day | SUBCUTANEOUS | Status: DC
  Administered 2016-01-07: 5 [IU] via SUBCUTANEOUS
  Administered 2016-01-08: 2 [IU] via SUBCUTANEOUS
  Administered 2016-01-09: 5 [IU] via SUBCUTANEOUS

## 2016-01-07 MED ORDER — INSULIN ASPART 100 UNIT/ML ~~LOC~~ SOLN: 0.0000 [IU] | Freq: Three times a day (TID) | SUBCUTANEOUS | Status: DC

## 2016-01-07 MED ORDER — MAGNESIUM HYDROXIDE 400 MG/5ML PO SUSP
15.0000 mL | Freq: Every day | ORAL | Status: DC | PRN
  Administered 2016-01-07: 15 mL via ORAL
  Filled 2016-01-07: qty 30

## 2016-01-07 MED ORDER — INSULIN ASPART 100 UNIT/ML ~~LOC~~ SOLN: 5.0000 [IU] | Freq: Three times a day (TID) | SUBCUTANEOUS | Status: DC

## 2016-01-07 MED ORDER — INSULIN ASPART 100 UNIT/ML ~~LOC~~ SOLN
5.0000 [IU] | Freq: Three times a day (TID) | SUBCUTANEOUS | Status: DC
  Administered 2016-01-07 – 2016-01-11 (×14): 5 [IU] via SUBCUTANEOUS

## 2016-01-07 MED ORDER — INSULIN ASPART 100 UNIT/ML ~~LOC~~ SOLN
0.0000 [IU] | Freq: Three times a day (TID) | SUBCUTANEOUS | Status: DC
  Administered 2016-01-07: 20 [IU] via SUBCUTANEOUS
  Administered 2016-01-07: 3 [IU] via SUBCUTANEOUS
  Administered 2016-01-08: 15 [IU] via SUBCUTANEOUS
  Administered 2016-01-08: 7 [IU] via SUBCUTANEOUS
  Administered 2016-01-08: 11 [IU] via SUBCUTANEOUS
  Administered 2016-01-09 – 2016-01-10 (×4): 7 [IU] via SUBCUTANEOUS
  Administered 2016-01-10: 15 [IU] via SUBCUTANEOUS
  Administered 2016-01-11: 11 [IU] via SUBCUTANEOUS
  Administered 2016-01-11: 4 [IU] via SUBCUTANEOUS
  Administered 2016-01-11: 15 [IU] via SUBCUTANEOUS
  Administered 2016-01-12: 7 [IU] via SUBCUTANEOUS
  Administered 2016-01-12: 4 [IU] via SUBCUTANEOUS

## 2016-01-07 NOTE — Progress Notes (Signed)
Patient ID: Jacob [name], male   DOB: 01/31/1956, 60 y.o.   MRN: [id number]    Advanced Heart Failure Rounding Note   Subjective:    Admitted with marked volume overload.   Brisk diuresis again with lasix 80 mg IV tid + metolazone 5 mg twice daily + milrinone 0.25 mcg. Weight down another 3 pounds.   SOB with exertion. Denies CP.   BUN/creatinine bumped higher today, sodium 125.     Objective:   Weight Range:  Vital Signs:   Temp:  [98.2 F (36.8 C)-98.6 F (37 C)] 98.6 F (37 C) (10/15 1001) Pulse Rate:  [102-106] 103 (10/15 1001) Resp:  [16-18] 18 (10/15 1001) BP: (96-118)/(44-75) 117/75 (10/15 1001) SpO2:  [95 %-98 %] 98 % (10/15 1001) Weight:  [213 lb 11.2 oz (96.9 kg)] 213 lb 11.2 oz (96.9 kg) (10/15 0438) Last BM Date: 01/05/16  Weight change: Filed Weights   01/05/16 0437 01/06/16 0501 01/07/16 0438  Weight: 220 lb 11.2 oz (100.1 kg) 216 lb 4.8 oz (98.1 kg) 213 lb 11.2 oz (96.9 kg)    Intake/Output:   Intake/Output Summary (Last 24 hours) at 01/07/16 1118 Last data filed at 01/07/16 0900  Gross per 24 hour  Intake              800 ml  Output             2925 ml  Net            -2125 ml     Physical Exam: General:  Chronically ill appearing. No resp difficulty. In the chair  HEENT: normal Neck: supple. JVP to 14-16 cm. Carotids 2+ bilat; no bruits. No lymphadenopathy or thryomegaly appreciated. Cor: PMI lateral. Mildly tachy, regular rate & rhythm. No rubs, gallops or murmurs. Lungs: RML LLL crackles.  Abdomen: soft, nontender, + distended. No hepatosplenomegaly. No bruits or masses. Good bowel sounds. Extremities: no cyanosis, clubbing, rash, 2+ edema to knees bilaterally.  Neuro: alert & orientedx3, cranial nerves grossly intact. moves all 4 extremities w/o difficulty. Affect pleasant  Telemetry:  ?atypical atrial flutter HR 100s.    Labs: Basic Metabolic Panel:  Recent Labs Lab 01/03/16 0344 01/04/16 0304 01/05/16 0343 01/06/16 0546  01/07/16 0445  NA 130* 128* 129* 127* 125*  K 3.8 4.7 4.7 4.5 4.4  CL 90* 88* 85* 82* 79*  CO2 29 28 32 32 33*  GLUCOSE 196* 257* 188* 173* 310*  BUN 40* 44* 44* 49* 61*  CREATININE 1.88* 1.80* 1.88* 1.80* 2.11*  CALCIUM 8.5* 8.7* 9.2 9.3 9.2  MG  --  2.0  --   --   --     Liver Function Tests:  Recent Labs Lab 01/01/16 1554 01/02/16 0840  AST 49* 17  ALT 16* 11*  ALKPHOS 160* 148*  BILITOT 2.2* 1.7*  PROT 7.2 7.0  ALBUMIN 3.4* 3.3*   No results for input(s): LIPASE, AMYLASE in the last 168 hours. No results for input(s): AMMONIA in the last 168 hours.  CBC:  Recent Labs Lab 01/01/16 1554 01/02/16 0840 01/06/16 0546  WBC 7.4 8.5 9.1  NEUTROABS 5.3 6.4  --   HGB 12.5* 12.3* 13.3  HCT 41.1 43.3 43.1  MCV 84.2 85.2 80.1  PLT 259 234 236    Cardiac Enzymes:  Recent Labs Lab 01/01/16 1554 01/01/16 2111 01/02/16 0228 01/02/16 0840  TROPONINI 0.05* 0.05* 0.04* 0.05*    BNP: BNP (last 3 results)  Recent Labs  08/15/15 1447 11/08/15 1450 01/01/16 1603  BNP 546.6* 748.2* 568.6*    ProBNP (last 3 results) No results for input(s): PROBNP in the last 8760 hours.    Other results:  Imaging: No results found.   Medications:     Scheduled Medications: . amiodarone  200 mg Oral BID  . arformoterol  15 mcg Nebulization BID  . atorvastatin  40 mg Oral q1800  . budesonide (PULMICORT) nebulizer solution  0.5 mg Nebulization BID  . carvedilol  3.125 mg Oral BID WC  . digoxin  0.0625 mg Oral Daily  . ferrous sulfate  325 mg Oral TID WC  . furosemide  80 mg Intravenous TID  . gabapentin  300 mg Oral BID  . Influenza vac split quadrivalent PF  0.5 mL Intramuscular Tomorrow-1000  . insulin aspart  0-5 Units Subcutaneous QHS  . insulin aspart  0-9 Units Subcutaneous TID WC  . insulin aspart  3 Units Subcutaneous TID WC  . metolazone  5 mg Oral BID  . nicotine  21 mg Transdermal Daily  . rivaroxaban  20 mg Oral QAC supper  . sodium chloride flush   3 mL Intravenous Q12H  . spironolactone  25 mg Oral Daily    Infusions: . milrinone 0.25 mcg/kg/min (01/07/16 0153)    PRN Medications: sodium chloride, albuterol, ipratropium-albuterol, ondansetron (ZOFRAN) IV, sodium chloride flush   Assessment:  1. Acute on chronic systolic HF    --echo 8/17 EF 98-11%25-30% 2. Chronic Afib 3. DM2 4. H/o GI bleed 5. Tobacco abuse 6. [facility] 7. 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 Non-compliance 9. Hyponatremia   Plan/Discussion:    Marked volume overload in the setting of medication noncompliance.   Ongoing brisk diuresis.  Still very volume overloaded on exam but BUN/creatinine bumped today.  Will try for 1 more day Lasix  80 mg three times daily + metolazone 5 mg twice daily+ milrinone 0.25 mcg. Suspect we will have to back off on diuresis tomorrow.   Digoxin level ok.   Looks like he is in atypical atrial flutter today, rate 100s.  He has chronic atrial arrhythmias (fibrillation/flutter).  Continue amiodarone 200 mg twice daily, has been on for rate control. On Xarelto.   Consult cardiac rehab.   Length of Stay: 5  Marca AnconaDalton Akito Boomhower  01/07/2016, 11:18 AM  Advanced Heart Failure Team Pager [phone] (M-F; 7a - 4p)  Please contact [facility] for night-coverage after hours (4p -7a ) and weekends on [email or web address]

## 2016-01-07 NOTE — Progress Notes (Signed)
Refused bed alarm. Will continue to monitor patient. 

## 2016-01-07 NOTE — Progress Notes (Signed)
PROGRESS NOTE   Jacob [name]  WKM:[id number] DOB: 08/25/1955 DOA: 01/01/2016  PCP: [name], MD   Brief Narrative:  60 y.o. 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 He complains of substernal chest pain that he reports is similar to previous pain.  Associated symptoms include weight gain of 12 pounds, swelling of the legs/abdomen, PND, dyspnea on exertion, sleeps with 2-3 pillows, and orthopnea. Patient reports that he takes his medications as prescribed. However, patient then states that he has been out of digoxin and Bidil for at least 2 weeks now.  Patient's followed as an outpatient setting by Dr. Gala Romney. Last hospitalization was 8/16- 8/22 at [facility] with similar symptoms, and reported to be out of medications.  Assessment & Plan:  Acute on chronic combined systolic and diastolic CHF - Last EF 25-30% in 10/2015 with diffuse hypokinesis - volume status improving but still volume overloaded - weight trend in the past 48 hours: 227 --> 220 --> 216 --> 213 lbs this AM - cardiology team following, pt currently on lasix 80 mg TID IV, metolazone 5 mg BID and Milrinone 0.25 mcg - need to jeep close eye on sodium level as it is trending down slowly and suspect from Lasix  - per cardiology, once pt fully diuresed anticipate transitioning to torsemide 80 mg twice a day  Atrial fibrillation on chronic anticoagulation therapy - continue to Xarelto, Amiodarone 200 mg BID - added back on digoxin - HR in low 100's  Elevated troponin level/chest pain - Add back on Bidil, but consider issues with patient obtaining this medication - follow up on cardiology recommendations   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 - Continue to monitor  - no chest pain this AM - keep on tele   Diabetes mellitus  type 2 with peripheral vascular compensation and nephropathy  - holding oral regimen, pt very clear he will not take insulin on discharge so our best option to encourage medical compliance with oral antihyperglycemics and dietary restrictions  - Continue Gabapentin  COPD, without acute exacerbation - continue nebulized budesonide and Brovana - Duonebs q 4hrs prn Sob/wheezing  Essential hypertension - continue current medication regimen: Coreg 3.125 mg BID, Lasix, Spironolactone   Chronic kidney disease stage III, hyponatremia  - Creatinine has ranged from 1.14- 1.99 over the last few months - Cr trending up, likely from lasix effect  - see with cardiology if lasix dose or frequency can be lowered as sodium is also trending down  - BMP in AM  Elevated transaminases - Elevated AST and AP. Suspect secondary to passive congestion from CHF exacerbation. - Continue to monitor   Tobacco abuse  - Nicotine patch - Counseled on the need of cessation of tobacco use by Dr. Benjamine Mola   Morbid obesity - pt meets criteria for morbid obesity with BMI > 35 and underlying DM, HTN, CAD - Body mass index is 36.73 kg/m.  DVT prophylaxis:   SCD's, Xarelto  Code Status:  Full Code  Family Communication:  Patient   Disposition Plan:   To be determined, likely home when volume overload resolved   Consultants:   Cardiology   Subjective: No concerns this AM. Reports feeling better this AM.   Objective: Vitals:   01/06/16 1932 01/07/16 0438 01/07/16 0947 01/07/16 1001  BP: (!) 117/45 Marland Kitchen)  118/44  117/75  Pulse: (!) 102 (!) 106  (!) 103  Resp: 18 18  18   Temp: 98.5 F (36.9 C) 98.5 F (36.9 C)  98.6 F (37 C)  TempSrc: Oral Oral    SpO2: 95% 96% 98% 98%  Weight:  96.9 kg (213 lb 11.2 oz)    Height:        Intake/Output Summary (Last 24 hours) at 01/07/16 1058 Last data filed at 01/07/16 0900  Gross per 24 hour  Intake              800 ml  Output             2925 ml  Net             -2125 ml   Filed Weights   01/05/16 0437 01/06/16 0501 01/07/16 0438  Weight: 100.1 kg (220 lb 11.2 oz) 98.1 kg (216 lb 4.8 oz) 96.9 kg (213 lb 11.2 oz)    Examination:  General exam: more alert this AM, sitting in chair  Respiratory system: crackles at bases still noted  Cardiovascular system: iRR. + JVD, no murmurs, rubs, gallops or clicks. +2-3 bilateral LE edema  Gastrointestinal system: Abdomen is nondistended, soft and nontender. No organomegaly or masses felt.   Data Reviewed: I have personally reviewed following labs and imaging studies  CBC:  Recent Labs Lab 01/01/16 1554 01/02/16 0840 01/06/16 0546  WBC 7.4 8.5 9.1  NEUTROABS 5.3 6.4  --   HGB 12.5* 12.3* 13.3  HCT 41.1 43.3 43.1  MCV 84.2 85.2 80.1  PLT 259 234 236   Basic Metabolic Panel:  Recent Labs Lab 01/03/16 0344 01/04/16 0304 01/05/16 0343 01/06/16 0546 01/07/16 0445  NA 130* 128* 129* 127* 125*  K 3.8 4.7 4.7 4.5 4.4  CL 90* 88* 85* 82* 79*  CO2 29 28 32 32 33*  GLUCOSE 196* 257* 188* 173* 310*  BUN 40* 44* 44* 49* 61*  CREATININE 1.88* 1.80* 1.88* 1.80* 2.11*  CALCIUM 8.5* 8.7* 9.2 9.3 9.2  MG  --  2.0  --   --   --    Liver Function Tests:  Recent Labs Lab 01/01/16 1554 01/02/16 0840  AST 49* 17  ALT 16* 11*  ALKPHOS 160* 148*  BILITOT 2.2* 1.7*  PROT 7.2 7.0  ALBUMIN 3.4* 3.3*   Cardiac Enzymes:  Recent Labs Lab 01/01/16 1554 01/01/16 2111 01/02/16 0228 01/02/16 0840  TROPONINI 0.05* 0.05* 0.04* 0.05*   CBG:  Recent Labs Lab 01/06/16 0634 01/06/16 1111 01/06/16 1659 01/06/16 2047 01/07/16 0611  GLUCAP 184* 362* 278* 258* 314*   Urine analysis:    Component Value Date/Time   COLORURINE YELLOW 12/01/2014 1300   APPEARANCEUR CLEAR 12/01/2014 1300   LABSPEC 1.009 12/01/2014 1300   PHURINE 5.5 12/01/2014 1300   GLUCOSEU NEGATIVE 12/01/2014 1300   HGBUR NEGATIVE 12/01/2014 1300   BILIRUBINUR NEGATIVE 12/01/2014 1300   KETONESUR NEGATIVE 12/01/2014  1300   PROTEINUR NEGATIVE 12/01/2014 1300   UROBILINOGEN 1.0 12/01/2014 1300   NITRITE NEGATIVE 12/01/2014 1300   LEUKOCYTESUR NEGATIVE 12/01/2014 1300    Anti-infectives    None     Radiology Studies: No results found.  Scheduled Meds: . amiodarone  200 mg Oral BID  . arformoterol  15 mcg Nebulization BID  . atorvastatin  40 mg Oral q1800  . budesonide (PULMICORT) nebulizer solution  0.5 mg Nebulization BID  . carvedilol  3.125 mg Oral BID WC  . digoxin  0.0625 mg Oral  Daily  . ferrous sulfate  325 mg Oral TID WC  . furosemide  80 mg Intravenous TID  . gabapentin  300 mg Oral BID  . Influenza vac split quadrivalent PF  0.5 mL Intramuscular Tomorrow-1000  . insulin aspart  0-5 Units Subcutaneous QHS  . insulin aspart  0-9 Units Subcutaneous TID WC  . insulin aspart  3 Units Subcutaneous TID WC  . metolazone  5 mg Oral BID  . nicotine  21 mg Transdermal Daily  . rivaroxaban  20 mg Oral QAC supper  . sodium chloride flush  3 mL Intravenous Q12H  . spironolactone  25 mg Oral Daily   Continuous Infusions: . milrinone 0.25 mcg/kg/min (01/07/16 0153)     LOS: 5 days    Time spent: 15 min    MAGICK-Krystol Rocco, DO [facility] Pager [phone]  If 7PM-7AM, please contact night-coverage [email or web address] Password [email or web address] 01/07/2016, 10:58 AM

## 2016-01-08 ENCOUNTER — Telehealth: Payer: Self-pay

## 2016-01-08 LAB — GLUCOSE, CAPILLARY
GLUCOSE-CAPILLARY: 256 mg/dL — AB (ref 65–99)
GLUCOSE-CAPILLARY: 205 mg/dL — AB (ref 65–99)
Glucose-Capillary: 334 mg/dL — ABNORMAL HIGH (ref 65–99)
Glucose-Capillary: 279 mg/dL — ABNORMAL HIGH (ref 65–99)
Glucose-Capillary: 332 mg/dL — ABNORMAL HIGH (ref 65–99)
Glucose-Capillary: 228 mg/dL — ABNORMAL HIGH (ref 65–99)

## 2016-01-08 LAB — CBC
HEMATOCRIT: 42.4 % (ref 39.0–52.0)
Hemoglobin: 13.6 g/dL (ref 13.0–17.0)
MCH: 25.2 pg — ABNORMAL LOW (ref 26.0–34.0)
MCHC: 32.1 g/dL (ref 30.0–36.0)
MCV: 78.5 fL (ref 78.0–100.0)
Platelets: 226 10*3/uL (ref 150–400)
RBC: 5.4 MIL/uL (ref 4.22–5.81)
RDW: 18.1 % — ABNORMAL HIGH (ref 11.5–15.5)
WBC: 9 10*3/uL (ref 4.0–10.5)

## 2016-01-08 LAB — BASIC METABOLIC PANEL
ANION GAP: 13 (ref 5–15)
BUN: 67 mg/dL — ABNORMAL HIGH (ref 6–20)
CO2: 30 mmol/L (ref 22–32)
Calcium: 9.2 mg/dL (ref 8.9–10.3)
Chloride: 79 mmol/L — ABNORMAL LOW (ref 101–111)
Creatinine, Ser: 2.1 mg/dL — ABNORMAL HIGH (ref 0.61–1.24)
GFR calc Af Amer: 38 mL/min — ABNORMAL LOW (ref >60)
GFR calc non Af Amer: 33 mL/min — ABNORMAL LOW (ref >60)
GLUCOSE: 245 mg/dL — AB (ref 65–99)
POTASSIUM: 4.4 mmol/L (ref 3.5–5.1)
Sodium: 122 mmol/L — ABNORMAL LOW (ref 135–145)

## 2016-01-08 MED ORDER — MILRINONE LACTATE IN DEXTROSE 20-5 MG/100ML-% IV SOLN
0.1250 ug/kg/min | INTRAVENOUS | Status: DC
  Administered 2016-01-08 – 2016-01-09 (×2): 0.125 ug/kg/min via INTRAVENOUS
  Filled 2016-01-08: qty 100

## 2016-01-08 MED ORDER — AMIODARONE HCL 200 MG PO TABS
200.0000 mg | ORAL_TABLET | Freq: Every day | ORAL | Status: DC
  Administered 2016-01-09 – 2016-01-12 (×4): 200 mg via ORAL
  Filled 2016-01-08 (×4): qty 1

## 2016-01-08 NOTE — Progress Notes (Signed)
Patient ID: Jacob [name], male   DOB: 12-04-1955, 60 y.o.   MRN: [id number]    Advanced Heart Failure Rounding Note   Subjective:    Admitted with marked volume overload.   Brisk diuresis again with lasix 80 mg IV tid + metolazone 5 mg twice daily + milrinone 0.25 mcg. Weight unchanged but negative 2 liters.  BUN/creatinine bumped higher today, sodium down 122.     Feels better. Denies SOB or orthopnea. Bloating improved. Weight down to 213 (admit 232; baseline around 205)  Objective:   Weight Range:  Vital Signs:   Temp:  [97.7 F (36.5 C)-98.6 F (37 C)] 98 F (36.7 C) (10/16 0552) Pulse Rate:  [103-105] 105 (10/16 0552) Resp:  [18] 18 (10/16 0552) BP: (100-123)/(56-75) 100/62 (10/16 0552) SpO2:  [94 %-99 %] 97 % (10/16 0848) Weight:  [213 lb 6.4 oz (96.8 kg)] 213 lb 6.4 oz (96.8 kg) (10/16 0552) Last BM Date: 01/05/16  Weight change: Filed Weights   01/06/16 0501 01/07/16 0438 01/08/16 0552  Weight: 216 lb 4.8 oz (98.1 kg) 213 lb 11.2 oz (96.9 kg) 213 lb 6.4 oz (96.8 kg)    Intake/Output:   Intake/Output Summary (Last 24 hours) at 01/08/16 0856 Last data filed at 01/08/16 0553  Gross per 24 hour  Intake           891.96 ml  Output             2920 ml  Net         -2028.04 ml     Physical Exam: General: No resp difficulty. In the chair watching TV HEENT: normal x for poor dentition Neck: supple. JVP ~9  Carotids 2+ bilat; no bruits. No lymphadenopathy or thryomegaly appreciated. Cor: PMI lateral. Mildly tachy, regular rate & rhythm. No rubs, gallops or murmurs. Lungs: Decreased in the bases.   Abdomen: soft, nontender, non distended. No hepatosplenomegaly. No bruits or masses. Good bowel sounds. Extremities: no cyanosis, clubbing, rash, trace edema to knees bilaterally.  Neuro: alert & orientedx3, cranial nerves grossly intact. moves all 4 extremities w/o difficulty. Affect pleasant   Telemetry:  ?atypical atrial flutter HR 100s.    Labs: Basic Metabolic  Panel:  Recent Labs Lab 01/04/16 0304 01/05/16 0343 01/06/16 0546 01/07/16 0445 01/08/16 0326  NA 128* 129* 127* 125* 122*  K 4.7 4.7 4.5 4.4 4.4  CL 88* 85* 82* 79* 79*  CO2 28 32 32 33* 30  GLUCOSE 257* 188* 173* 310* 245*  BUN 44* 44* 49* 61* 67*  CREATININE 1.80* 1.88* 1.80* 2.11* 2.10*  CALCIUM 8.7* 9.2 9.3 9.2 9.2  MG 2.0  --   --   --   --     Liver Function Tests:  Recent Labs Lab 01/01/16 1554 01/02/16 0840  AST 49* 17  ALT 16* 11*  ALKPHOS 160* 148*  BILITOT 2.2* 1.7*  PROT 7.2 7.0  ALBUMIN 3.4* 3.3*   No results for input(s): LIPASE, AMYLASE in the last 168 hours. No results for input(s): AMMONIA in the last 168 hours.  CBC:  Recent Labs Lab 01/01/16 1554 01/02/16 0840 01/06/16 0546 01/08/16 0326  WBC 7.4 8.5 9.1 9.0  NEUTROABS 5.3 6.4  --   --   HGB 12.5* 12.3* 13.3 13.6  HCT 41.1 43.3 43.1 42.4  MCV 84.2 85.2 80.1 78.5  PLT 259 234 236 226    Cardiac Enzymes:  Recent Labs Lab 01/01/16 1554 01/01/16 2111 01/02/16 0228 01/02/16 0840  TROPONINI 0.05* 0.05* 0.04*  0.05*    BNP: BNP (last 3 results)  Recent Labs  08/15/15 1447 11/08/15 1450 01/01/16 1603  BNP 546.6* 748.2* 568.6*    ProBNP (last 3 results) No results for input(s): PROBNP in the last 8760 hours.    Other results:  Imaging: No results found.   Medications:     Scheduled Medications: . amiodarone  200 mg Oral BID  . arformoterol  15 mcg Nebulization BID  . atorvastatin  40 mg Oral q1800  . budesonide (PULMICORT) nebulizer solution  0.5 mg Nebulization BID  . carvedilol  3.125 mg Oral BID WC  . digoxin  0.0625 mg Oral Daily  . ferrous sulfate  325 mg Oral TID WC  . furosemide  80 mg Intravenous TID  . gabapentin  300 mg Oral BID  . Influenza vac split quadrivalent PF  0.5 mL Intramuscular Tomorrow-1000  . insulin aspart  0-20 Units Subcutaneous TID WC  . insulin aspart  0-5 Units Subcutaneous QHS  . insulin aspart  5 Units Subcutaneous TID WC  .  metolazone  5 mg Oral BID  . nicotine  21 mg Transdermal Daily  . rivaroxaban  20 mg Oral QAC supper  . sodium chloride flush  3 mL Intravenous Q12H  . spironolactone  25 mg Oral Daily    Infusions: . milrinone 0.25 mcg/kg/min (01/08/16 0346)    PRN Medications: sodium chloride, albuterol, ipratropium-albuterol, magnesium hydroxide, ondansetron (ZOFRAN) IV, sodium chloride flush   Assessment:  1. Acute on chronic systolic HF    --echo 8/17 EF 76-16% 2. Chronic Afib 3. DM2, uncontrolled 4. H/o GI bleed 5. Tobacco abuse 6. CAD s/p CABG in 2014 at [facility] 7. 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 Non-compliance 9. Hyponatremia   Plan/Discussion:    Admitted with marked volume overload in the setting of medication noncompliance.   BUN trending up. Sodium trending down. Hold diuretics today. Cut back 0.125 mcg. Anticipate starting torsemide 80 mg twice a day tomorrow. BMET in am.   Digoxin level ok.   Looks like he is in atypical atrial flutter today, rate 100s.  He has chronic atrial arrhythmias (fibrillation/flutter).  Continue amiodarone 200 mg twice daily, has been on for rate control. On Xarelto.   Consult cardiac rehab.   Length of Stay: 6  Jacob Clegg NP-C  01/08/2016, 8:56 AM  Advanced Heart Failure Team Pager [phone] (M-F; 7a - 4p)  Please contact [facility] for night-coverage after hours (4p -7a ) and weekends on [email or web address]  Patient seen and examined with Jacob Becket, NP. We discussed all aspects of the encounter. I agree with the assessment and plan as stated above.   Volume status improved but weight still not back to baseline. Renal function up a bit but stable. Will hold diuretics. Can cut milrinone back slightly. Watch renal function. Likely restart po diuretics in am.   Remains in AF/AFL. Having a hand tremor. Will cut amio back to 200 daily. Continue Xarelto.    Toriano Aikey,MD 7:59 PM

## 2016-01-08 NOTE — Progress Notes (Signed)
Physical Therapy Treatment Patient Details Name: Bethann PunchesYahia Hefferan MRN: [id number] DOB: 02/07/1956 Today's Date: 01/08/2016    History of Present Illness Patient is a 60 yo male admitted 01/01/16 with dyspnea, chest pain, LE edema, increased weight.  Patient with CHF exacerbation.    PMH:  Afib, HTN, CHF, EF 25-35%, CAD, MI, CABG, pacemaker, DM, COPD, obesity    PT Comments    Pt is up to walk with PT using RW as he is in need of it, unsteady and running into obstacles.  He is willing to push to walk farther but is going to need assistance for hallway gait.  Have asked for PT outpatient to follow up with him to progress his strength and balance.  Follow Up Recommendations  Outpatient PT (for cardiac rehab)     Equipment Recommendations  Rolling walker with 5" wheels    Recommendations for Other Services Rehab consult     Precautions / Restrictions Precautions Precautions: Fall Restrictions Weight Bearing Restrictions: No    Mobility  Bed Mobility               General bed mobility comments: up in chair  Transfers Overall transfer level: Needs assistance Equipment used: Rolling walker (2 wheeled);None Transfers: Sit to/from [employer]Stand;Stand Pivot Transfers Sit to Stand: Min guard Stand pivot transfers: Min guard       General transfer comment: unsteady in transition but seems more related to his coordination  Ambulation/Gait Ambulation/Gait assistance: Min assist;Min guard Ambulation Distance (Feet): 200 Feet Assistive device: Rolling walker (2 wheeled) Gait Pattern/deviations: Step-through pattern;Wide base of support;Drifts right/left;Decreased stride length Gait velocity: reduced Gait velocity interpretation: Below normal speed for age/gender General Gait Details: unsafe to turn and needs contact to steady by PT   Stairs            Wheelchair Mobility    Modified Rankin (Stroke Patients Only)       Balance Overall balance assessment: Needs  assistance Sitting-balance support: Feet supported Sitting balance-Leahy Scale: Good     Standing balance support: Bilateral upper extremity supported Standing balance-Leahy Scale: Fair Standing balance comment: using walker and runs into the walls and furniture unless PT intervenes                    Cognition Arousal/Alertness: Awake/alert Behavior During Therapy: WFL for tasks assessed/performed Overall Cognitive Status: Within Functional Limits for tasks assessed                      Exercises General Exercises - Lower Extremity Ankle Circles/Pumps: AROM;Both;10 reps Long Arc Quad: Strengthening;Both;10 reps Heel Slides: Strengthening;Both;10 reps Hip ABduction/ADduction: AROM;Both;10 reps    General Comments        Pertinent Vitals/Pain Pain Assessment: No/denies pain    Home Living                      Prior Function            PT Goals (current goals can now be found in the care plan section) Acute Rehab PT Goals PT Goal Formulation: With patient Progress towards PT goals: Progressing toward goals    Frequency    Min 3X/week      PT Plan Discharge plan needs to be updated    Co-evaluation             End of Session Equipment Utilized During Treatment: Gait belt Activity Tolerance: Patient tolerated treatment well Patient left: in chair;with call bell/phone  within reach     Time: 4580-9983 PT Time Calculation (min) (ACUTE ONLY): 23 min  Charges:  $Gait Training: 8-22 mins $Therapeutic Exercise: 8-22 mins                    G Codes:      Ivar Drape 01-17-16, 2:33 PM    Samul Dada, PT MS Acute Rehab Dept. Number: [facility] [id number] and [facility] [phone]

## 2016-01-08 NOTE — Progress Notes (Signed)
Inpatient Diabetes Program Recommendations  AACE/ADA: New Consensus Statement on Inpatient Glycemic Control (2015)  Target Ranges:  Prepandial:   less than 140 mg/dL      Peak postprandial:   less than 180 mg/dL (1-2 hours)      Critically ill patients:  140 - 180 mg/dL   Results for KENO, TUCKERMAN (MRN [id number]) as of 01/08/2016 09:10  Ref. Range 01/07/2016 06:11 01/07/2016 10:59 01/07/2016 12:23 01/07/2016 16:28 01/07/2016 22:02 01/08/2016 03:26 01/08/2016 05:05 01/08/2016 07:29 01/08/2016 07:54  Glucose-Capillary Latest Ref Range: 65 - 99 mg/dL 245 (H) 809 (H) 983 (H) 142 (H) 353 (H)  334 (H) 279 (H) 256 (H)    Review of Glycemic Control  Current orders for Inpatient glycemic control: Novolog 0-20 units TIDAC, 0-5 units QHS; Novolog 5 units TID meal coverage  Inpatient Diabetes Program Recommendations: In speaking with RN caring for patient on Friday October 13th, patient is asking for high carb snacks routinely.  Also, patient is currently refusing to go home on insulin when this RN spoke with patient both Thursday and Friday of last week.  I am concerned with the trends in CBG's along with patient eating multiple carbohydrate rich snacks.  Patient would benefit from basal insulin, however he is stating he will not take any insulin at home and states he wants to continue eating snacks like he is currently doing.  RD has seen patient, education to self-manage diabetes has been given, and patient has been offered support. Thank you,  Kristine Linea, RN, BSN Diabetes Coordinator Inpatient Diabetes Program [phone] (Team Pager) [phone] (AP office) [phone] [facility] office) [phone] [facility] office)

## 2016-01-08 NOTE — Progress Notes (Signed)
Nutrition Brief Note  Nutrition follow-up today per pt request during last visit. Pt states that he did not think of any other questions regarding low sodium diet and denies any other nutrition related questions or concerns at this time. He reports eating well. RD encouraged pt to request return visit from nutrition staff via RN if any questions arise.   Dorothea Ogle RD, CSP, LDN Inpatient Clinical Dietitian Pager: [phone] After Hours Pager: [phone]

## 2016-01-08 NOTE — Telephone Encounter (Signed)
The patient's appointment at the [facility]itional [facility] at [facility] has been rescheduled to 01/11/16 @ 1115 due to his hospitalization. The AVS has been updated and this CM spoke to [place], [facility] # [phone] and informed her of the change. She stated that she will meet him at the appointment.   Will continue to follow his hospital course and reschedule the appointment as needed

## 2016-01-08 NOTE — Care Management Important Message (Signed)
Important Message  Patient Details  Name: Jacob [name] MRN: [id number] Date of Birth: 26-Sep-1955   Medicare Important Message Given:  Yes    [name] [name] 01/08/2016, 1:39 PM

## 2016-01-08 NOTE — Progress Notes (Signed)
CARDIAC REHAB PHASE I   PRE:  Rate/Rhythm: 104 ST    BP: sitting 104/70    SaO2: 98 RA  MODE:  Ambulation: 168 ft   POST:  Rate/Rhythm: 107 ST    BP: sitting 126/67     SaO2: 97 RA  Pt willing to walk. Stood and used RW with assist x2 for safety (pt sat too soon last Friday, almost falling). Pt fairly steady at times but struggles with reasoning skills/processing therefore unsteady at times. He needed help maneuvering RW around obstacles. He let go of RW while turning around in hall, just using one hand. He was able to walk fairly straight in hall but turns and room navigating is especially difficult. Denied specific c/o. Return to recliner. Changed linen as it was saturated with urine.  [id number]  Harriet Masson CES, ACSM 01/08/2016 11:08 AM

## 2016-01-08 NOTE — Progress Notes (Signed)
PROGRESS NOTE   Jacob [name]  ZOX:[id number] DOB: 12-10-1955 DOA: 01/01/2016  PCP: [name], MD   Brief Narrative:  60 y.o. 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 He complains of substernal chest pain that he reports is similar to previous pain.  Associated symptoms include weight gain of 12 pounds, swelling of the legs/abdomen, PND, dyspnea on exertion, sleeps with 2-3 pillows, and orthopnea. Patient reports that he takes his medications as prescribed. However, patient then states that he has been out of digoxin and Bidil for at least 2 weeks now.  Patient's followed as an outpatient setting by Dr. Gala Romney. Last hospitalization was 8/16- 8/22 at [facility] with similar symptoms, and reported to be out of medications.  Assessment & Plan:  Acute on chronic combined systolic and diastolic CHF - Last EF 25-30% in 10/2015 with diffuse hypokinesis - volume status improving but still volume overloaded - weight trend in the past 48 hours: 227 --> 220 --> 216 --> 213 lbs this AM - cardiology team following, pt currently on lasix 80 mg TID IV, metolazone 5 mg BID and Milrinone 0.25 mcg - need to keep close eye on sodium level as it is trending down slowly and suspect from Lasix  - per cardiology, once pt fully diuresed anticipate transitioning to torsemide 80 mg twice a day - will repeat BMP in AM  Atrial fibrillation on chronic anticoagulation therapy - continue to Xarelto, Amiodarone 200 mg BID - added back on digoxin - HR in low 100's, pt asymptomatic this AM   Elevated troponin level/chest pain - Add back on Bidil, but consider issues with patient obtaining this medication - follow up on cardiology recommendations   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 - Continue to monitor  - no chest pain  this AM - keep on tele for now  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  - holding oral regimen, pt very clear he will not take insulin on discharge so our best option to encourage medical compliance with oral antihyperglycemics and dietary restrictions  - Continue Gabapentin  COPD, without acute exacerbation - continue nebulized budesonide and Brovana - Duonebs q 4hrs prn Sob/wheezing  Essential hypertension - continue current medication regimen: Coreg 3.125 mg BID, Lasix, Spironolactone   Chronic kidney disease stage III, hyponatremia  - Creatinine has ranged from 1.14- 1.99 over the last few months - Cr trending up, likely from lasix effect  - see with cardiology if lasix dose or frequency can be lowered as sodium is also trending down  - BMP in AM  Elevated transaminases - Elevated AST and AP. Suspect secondary to passive congestion from CHF exacerbation. - Continue to monitor   Tobacco abuse  - Nicotine patch - Counseled on the need of cessation of tobacco use by Dr. Benjamine Mola   Morbid obesity - pt meets criteria for morbid obesity with BMI > 35 and underlying DM, HTN, CAD - Body mass index is 36.73 kg/m.  DVT prophylaxis:   SCD's, Xarelto  Code Status:  Full Code  Family Communication:  Patient   Disposition Plan:   To be determined, likely home when volume overload resolved   Consultants:   Cardiology   Subjective: No concerns this AM. Reports feeling better this AM.   Objective: Vitals:   01/08/16  0140 01/08/16 0552 01/08/16 0848 01/08/16 1128  BP: 102/63 100/62  116/67  Pulse: (!) 104 (!) 105  (!) 102  Resp: 18 18  18   Temp: 98 F (36.7 C) 98 F (36.7 C)  97.8 F (36.6 C)  TempSrc: Oral Oral  Oral  SpO2: 99%  97% 97%  Weight:  96.8 kg (213 lb 6.4 oz)    Height:        Intake/Output Summary (Last 24 hours) at 01/08/16 1152 Last data filed at 01/08/16 1012  Gross per 24 hour  Intake           1060.42 ml  Output             2920 ml  Net         -1859.58 ml   Filed Weights   01/06/16 0501 01/07/16 0438 01/08/16 0552  Weight: 98.1 kg (216 lb 4.8 oz) 96.9 kg (213 lb 11.2 oz) 96.8 kg (213 lb 6.4 oz)    Examination:  General exam: more alert this AM, sitting in chair  Respiratory system: crackles at bases still noted  Cardiovascular system: iRR. + JVD, no murmurs, rubs, gallops or clicks. +2-3 bilateral LE edema  Gastrointestinal system: Abdomen is nondistended, soft and nontender. No organomegaly or masses felt.   Data Reviewed: I have personally reviewed following labs and imaging studies  CBC:  Recent Labs Lab 01/01/16 1554 01/02/16 0840 01/06/16 0546 01/08/16 0326  WBC 7.4 8.5 9.1 9.0  NEUTROABS 5.3 6.4  --   --   HGB 12.5* 12.3* 13.3 13.6  HCT 41.1 43.3 43.1 42.4  MCV 84.2 85.2 80.1 78.5  PLT 259 234 236 226   Basic Metabolic Panel:  Recent Labs Lab 01/04/16 0304 01/05/16 0343 01/06/16 0546 01/07/16 0445 01/08/16 0326  NA 128* 129* 127* 125* 122*  K 4.7 4.7 4.5 4.4 4.4  CL 88* 85* 82* 79* 79*  CO2 28 32 32 33* 30  GLUCOSE 257* 188* 173* 310* 245*  BUN 44* 44* 49* 61* 67*  CREATININE 1.80* 1.88* 1.80* 2.11* 2.10*  CALCIUM 8.7* 9.2 9.3 9.2 9.2  MG 2.0  --   --   --   --    Liver Function Tests:  Recent Labs Lab 01/01/16 1554 01/02/16 0840  AST 49* 17  ALT 16* 11*  ALKPHOS 160* 148*  BILITOT 2.2* 1.7*  PROT 7.2 7.0  ALBUMIN 3.4* 3.3*   Cardiac Enzymes:  Recent Labs Lab 01/01/16 1554 01/01/16 2111 01/02/16 0228 01/02/16 0840  TROPONINI 0.05* 0.05* 0.04* 0.05*   CBG:  Recent Labs Lab 01/07/16 2202 01/08/16 0505 01/08/16 0729 01/08/16 0754 01/08/16 1134  GLUCAP 353* 334* 279* 256* 332*   Urine analysis:    Component Value Date/Time   COLORURINE YELLOW 12/01/2014 1300   APPEARANCEUR CLEAR 12/01/2014 1300   LABSPEC 1.009 12/01/2014 1300   PHURINE 5.5 12/01/2014 1300   GLUCOSEU NEGATIVE 12/01/2014 1300   HGBUR NEGATIVE  12/01/2014 1300   BILIRUBINUR NEGATIVE 12/01/2014 1300   KETONESUR NEGATIVE 12/01/2014 1300   PROTEINUR NEGATIVE 12/01/2014 1300   UROBILINOGEN 1.0 12/01/2014 1300   NITRITE NEGATIVE 12/01/2014 1300   LEUKOCYTESUR NEGATIVE 12/01/2014 1300    Anti-infectives    None     Radiology Studies: No results found.  Scheduled Meds: . amiodarone  200 mg Oral BID  . arformoterol  15 mcg Nebulization BID  . atorvastatin  40 mg Oral q1800  . budesonide (PULMICORT) nebulizer solution  0.5 mg Nebulization BID  . carvedilol  3.125 mg Oral BID WC  . digoxin  0.0625 mg Oral Daily  . ferrous sulfate  325 mg Oral TID WC  . gabapentin  300 mg Oral BID  . Influenza vac split quadrivalent PF  0.5 mL Intramuscular Tomorrow-1000  . insulin aspart  0-20 Units Subcutaneous TID WC  . insulin aspart  0-5 Units Subcutaneous QHS  . insulin aspart  5 Units Subcutaneous TID WC  . nicotine  21 mg Transdermal Daily  . rivaroxaban  20 mg Oral QAC supper  . sodium chloride flush  3 mL Intravenous Q12H  . spironolactone  25 mg Oral Daily   Continuous Infusions: . milrinone 0.125 mcg/kg/min (01/08/16 0949)     LOS: 6 days    Time spent: 15 min    MAGICK-Osa Campoli, DO [facility] Pager [phone]  If 7PM-7AM, please contact night-coverage [email or web address] Password [email or web address] 01/08/2016, 11:52 AM

## 2016-01-09 ENCOUNTER — Inpatient Hospital Stay: Payer: Medicare Other | Admitting: Family Medicine

## 2016-01-09 LAB — BASIC METABOLIC PANEL
Anion gap: 10 (ref 5–15)
BUN: 64 mg/dL — AB (ref 6–20)
CHLORIDE: 81 mmol/L — AB (ref 101–111)
CO2: 31 mmol/L (ref 22–32)
Calcium: 9 mg/dL (ref 8.9–10.3)
Creatinine, Ser: 1.73 mg/dL — ABNORMAL HIGH (ref 0.61–1.24)
GFR calc Af Amer: 48 mL/min — ABNORMAL LOW (ref >60)
GFR calc non Af Amer: 42 mL/min — ABNORMAL LOW (ref >60)
Glucose, Bld: 293 mg/dL — ABNORMAL HIGH (ref 65–99)
POTASSIUM: 4.4 mmol/L (ref 3.5–5.1)
SODIUM: 122 mmol/L — AB (ref 135–145)

## 2016-01-09 LAB — CBC
HEMATOCRIT: 43 % (ref 39.0–52.0)
HEMOGLOBIN: 13.6 g/dL (ref 13.0–17.0)
MCH: 25.1 pg — AB (ref 26.0–34.0)
MCHC: 31.6 g/dL (ref 30.0–36.0)
MCV: 79.5 fL (ref 78.0–100.0)
Platelets: 220 10*3/uL (ref 150–400)
RBC: 5.41 MIL/uL (ref 4.22–5.81)
RDW: 18.2 % — ABNORMAL HIGH (ref 11.5–15.5)
WBC: 8.1 10*3/uL (ref 4.0–10.5)

## 2016-01-09 LAB — GLUCOSE, CAPILLARY
GLUCOSE-CAPILLARY: 465 mg/dL — AB (ref 65–99)
Glucose-Capillary: 229 mg/dL — ABNORMAL HIGH (ref 65–99)
Glucose-Capillary: 215 mg/dL — ABNORMAL HIGH (ref 65–99)
Glucose-Capillary: 243 mg/dL — ABNORMAL HIGH (ref 65–99)

## 2016-01-09 MED ORDER — INSULIN ASPART 100 UNIT/ML ~~LOC~~ SOLN
12.0000 [IU] | Freq: Once | SUBCUTANEOUS | Status: AC
  Administered 2016-01-09: 12 [IU] via SUBCUTANEOUS

## 2016-01-09 MED ORDER — TORSEMIDE 20 MG PO TABS
80.0000 mg | ORAL_TABLET | Freq: Two times a day (BID) | ORAL | Status: DC
  Administered 2016-01-09 – 2016-01-10 (×4): 80 mg via ORAL
  Filled 2016-01-09 (×4): qty 4

## 2016-01-09 MED ORDER — INSULIN GLARGINE 100 UNIT/ML ~~LOC~~ SOLN
15.0000 [IU] | Freq: Every day | SUBCUTANEOUS | Status: DC
Start: 2016-01-09 — End: 2016-01-12
  Administered 2016-01-09 – 2016-01-11 (×3): 15 [IU] via SUBCUTANEOUS
  Filled 2016-01-09 (×5): qty 0.15

## 2016-01-09 NOTE — Progress Notes (Signed)
CARDIAC REHAB PHASE I   PRE:  Rate/Rhythm: 98 SR c/ PVCs  BP:  Sitting: 109/63        SaO2: 97 RA  MODE:  Ambulation: 192 ft   POST:  Rate/Rhythm: 100 SR  BP:  Sitting: 96/69         SaO2: 97 RA  Pt up in recliner, very sleepy, agreeable to walk. Pt ambulated 192 ft on RA, rolling walker, gait belt, assist x2, fairly steady gait, tolerated fairly well, denies any specific complaints. Pt demonstrates poor safety awareness, tends to rush, runs into obstacles with rolling walker. Pt declined rest stop. Pt to recliner after walk, call bell within reach. Will continue to follow as x2.   [id number] Joylene Grapes, RN, BSN 01/09/2016 2:28 PM

## 2016-01-09 NOTE — Progress Notes (Signed)
PROGRESS NOTE   Jacob [name]  IFO:[id number] DOB: [birth year] DOA: 01/01/2016  PCP: [name], MD   Brief Narrative:  60 y.o. 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 He complains of substernal chest pain that he reports is similar to previous pain.  Associated symptoms include weight gain of 12 pounds, swelling of the legs/abdomen, PND, dyspnea on exertion, sleeps with 2-3 pillows, and orthopnea. Patient reports that he takes his medications as prescribed. However, patient then states that he has been out of digoxin and Bidil for at least 2 weeks now.  Patient's followed as an outpatient setting by Dr. Gala Romney. Last hospitalization was 8/16- 8/22 at [facility] with similar symptoms, and reported to be out of medications.  Assessment & Plan:  Acute on chronic combined systolic and diastolic CHF - Last EF 25-30% in 10/2015 with diffuse hypokinesis - volume status improving but still volume overloaded - weight trend in the past 48 hours: 227 --> 220 --> 216 --> 213 lbs this AM - cardiology team following, pt was on lasix 80 mg TID IV, metolazone 5 mg BID and Milrinone 0.25 mcg - pt now transitioned to Torsemide 80 mg bid, Cr is trending down 2.1 --> 1.73, milrinone stopped now per cardiology  - will repeat BMP in AM  Atrial fibrillation on chronic anticoagulation therapy - continue to Xarelto, Amiodarone 200 mg BID - added back on digoxin - HR at target range   Elevated troponin level/chest pain - resolved chest pain  - follow up on cardiology recommendations   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 - Continue to monitor  - no chest pain this AM - keep on tele for now  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  - holding oral regimen, pt very clear he  will not take insulin on discharge so our best option to encourage medical compliance with oral antihyperglycemics and dietary restrictions  - Continue Gabapentin - will add Lantus while here 15 U QHS - on discharge, continue with Glipizide   COPD, without acute exacerbation - continue nebulized budesonide and Brovana - Duonebs q 4hrs prn Sob/wheezing  Essential hypertension - continue current medication regimen: Coreg 3.125 mg BID, Torsemide, Spironolactone   Chronic kidney disease stage III, hyponatremia  - Creatinine has ranged from 1.14- 1.99 over the last few months - Cr now trending down - BMP in AM  Elevated transaminases - Elevated AST and AP. Suspect secondary to passive congestion from CHF exacerbation. - Continue to monitor   Tobacco abuse  - Nicotine patch - Counseled on the need of cessation of tobacco use by Dr. Benjamine Mola   Morbid obesity - pt meets criteria for morbid obesity with BMI > 35 and underlying DM, HTN, CAD - Body mass index is 36.73 kg/m.  DVT prophylaxis:   SCD's, Xarelto  Code Status:  Full Code  Family Communication:  Patient   Disposition Plan:   Likely home in AM if cardiology clears   Consultants:   Cardiology   Subjective: No concerns this AM. Reports feeling better this AM.   Objective: Vitals:   01/08/16 2045 01/09/16 0430 01/09/16 0926 01/09/16 0927  BP:  117/66    Pulse:  72    Resp:  18    Temp:  97.5 F (36.4 C)  TempSrc:  Oral    SpO2: 97% 98% 98% 98%  Weight:  97 kg (213 lb 14.4 oz)    Height:        Intake/Output Summary (Last 24 hours) at 01/09/16 1020 Last data filed at 01/09/16 0948  Gross per 24 hour  Intake           1022.8 ml  Output             1850 ml  Net           -827.2 ml   Filed Weights   01/07/16 0438 01/08/16 0552 01/09/16 0430  Weight: 96.9 kg (213 lb 11.2 oz) 96.8 kg (213 lb 6.4 oz) 97 kg (213 lb 14.4 oz)    Examination:  General exam: more alert this AM, sitting in chair    Respiratory system: crackles at bases still noted but overall better  Cardiovascular system: iRR. + JVD, no murmurs, rubs, gallops or clicks. +2-3 bilateral LE edema  Gastrointestinal system: Abdomen is nondistended, soft and nontender. No organomegaly or masses felt.   Data Reviewed: I have personally reviewed following labs and imaging studies  CBC:  Recent Labs Lab 01/06/16 0546 01/08/16 0326 01/09/16 0356  WBC 9.1 9.0 8.1  HGB 13.3 13.6 13.6  HCT 43.1 42.4 43.0  MCV 80.1 78.5 79.5  PLT 236 226 220   Basic Metabolic Panel:  Recent Labs Lab 01/04/16 0304 01/05/16 0343 01/06/16 0546 01/07/16 0445 01/08/16 0326 01/09/16 0356  NA 128* 129* 127* 125* 122* 122*  K 4.7 4.7 4.5 4.4 4.4 4.4  CL 88* 85* 82* 79* 79* 81*  CO2 28 32 32 33* 30 31  GLUCOSE 257* 188* 173* 310* 245* 293*  BUN 44* 44* 49* 61* 67* 64*  CREATININE 1.80* 1.88* 1.80* 2.11* 2.10* 1.73*  CALCIUM 8.7* 9.2 9.3 9.2 9.2 9.0  MG 2.0  --   --   --   --   --    CBG:  Recent Labs Lab 01/08/16 0754 01/08/16 1134 01/08/16 1621 01/08/16 2150 01/09/16 0552  GLUCAP 256* 332* 205* 228* 229*   Urine analysis:    Component Value Date/Time   COLORURINE YELLOW 12/01/2014 1300   APPEARANCEUR CLEAR 12/01/2014 1300   LABSPEC 1.009 12/01/2014 1300   PHURINE 5.5 12/01/2014 1300   GLUCOSEU NEGATIVE 12/01/2014 1300   HGBUR NEGATIVE 12/01/2014 1300   BILIRUBINUR NEGATIVE 12/01/2014 1300   KETONESUR NEGATIVE 12/01/2014 1300   PROTEINUR NEGATIVE 12/01/2014 1300   UROBILINOGEN 1.0 12/01/2014 1300   NITRITE NEGATIVE 12/01/2014 1300   LEUKOCYTESUR NEGATIVE 12/01/2014 1300    Anti-infectives    None     Radiology Studies: No results found.  Scheduled Meds: . amiodarone  200 mg Oral Daily  . arformoterol  15 mcg Nebulization BID  . atorvastatin  40 mg Oral q1800  . budesonide (PULMICORT) nebulizer solution  0.5 mg Nebulization BID  . carvedilol  3.125 mg Oral BID WC  . digoxin  0.0625 mg Oral Daily  .  ferrous sulfate  325 mg Oral TID WC  . gabapentin  300 mg Oral BID  . Influenza vac split quadrivalent PF  0.5 mL Intramuscular Tomorrow-1000  . insulin aspart  0-20 Units Subcutaneous TID WC  . insulin aspart  0-5 Units Subcutaneous QHS  . insulin aspart  5 Units Subcutaneous TID WC  . insulin glargine  15 Units Subcutaneous QHS  . nicotine  21 mg Transdermal Daily  . rivaroxaban  20 mg Oral QAC supper  .  sodium chloride flush  3 mL Intravenous Q12H  . spironolactone  25 mg Oral Daily  . torsemide  80 mg Oral BID   Continuous Infusions:     LOS: 7 days    Time spent: 15 min    Debbora Presto, DO [facility] Pager [phone]  If 7PM-7AM, please contact night-coverage [email or web address] Password [email or web address] 01/09/2016, 10:20 AM

## 2016-01-09 NOTE — Progress Notes (Signed)
Patient is alert and orient up in chair, no distress. Iv saline locked and tolerating PO meds, Guards on Insulin and asked about an flex-Pen for discharge.

## 2016-01-09 NOTE — Progress Notes (Signed)
Up walking in room with no distress no acute episodes. How ever patient ask for a lot of foods and drinks. Gave Heart failure and diabetic  Teaching.

## 2016-01-09 NOTE — Progress Notes (Signed)
Inpatient Diabetes Program Recommendations  AACE/ADA: New Consensus Statement on Inpatient Glycemic Control (2015)  Target Ranges:  Prepandial:   less than 140 mg/dL      Peak postprandial:   less than 180 mg/dL (1-2 hours)      Critically ill patients:  140 - 180 mg/dL   Lab Results  Component Value Date   GLUCAP 229 (H) 01/09/2016   HGBA1C 11.0 (H) 11/09/2015    Review of Glycemic Control Results for Jacob [name], Jacob [name] (MRN [id number]) as of 01/09/2016 09:49  Ref. Range 01/08/2016 07:54 01/08/2016 11:34 01/08/2016 16:21 01/08/2016 21:50 01/09/2016 05:52  Glucose-Capillary Latest Ref Range: 65 - 99 mg/dL 263 (H) 785 (H) 885 (H) 228 (H) 229 (H)   Diabetes history: DM2 Outpatient Diabetes medications: Glipizide 10 mg BID Current orders for Inpatient glycemic control: Novolog 0-20 units TIDAC, 0-5 units QHS; Novolog 5 units TID meal coverage  Inpatient Diabetes Program Recommendations:  Reviewed CBGs. While Glipizide held, please consider Lantus 15 units daily while in the hospital.  Thank you, Billy Fischer. Jakhi Dishman, RN, MSN, CDE Inpatient Glycemic Control Team Team Pager [phone] (8am-5pm) 01/09/2016 9:56 AM

## 2016-01-09 NOTE — Progress Notes (Signed)
Patient ID: Jacob [name], male   DOB: 08-14-1955, 60 y.o.   MRN: [id number]    Advanced Heart Failure Rounding Note   Subjective:    Admitted with marked volume overload.   Yesterday IV lasix was stopped and milrinone was cut back to 0.125 mcg. Todays creatinine 1.7 which is down from 2.1.   Denies SOB.   Objective:   Weight Range:  Vital Signs:   Temp:  [97.5 F (36.4 C)-97.8 F (36.6 C)] 97.5 F (36.4 C) (10/17 0430) Pulse Rate:  [72-102] 72 (10/17 0430) Resp:  [18] 18 (10/17 0430) BP: (115-117)/(63-67) 117/66 (10/17 0430) SpO2:  [97 %-98 %] 98 % (10/17 0430) Weight:  [97 kg (213 lb 14.4 oz)] 97 kg (213 lb 14.4 oz) (10/17 0430) Last BM Date: 01/05/16  Weight change: Filed Weights   01/07/16 0438 01/08/16 0552 01/09/16 0430  Weight: 96.9 kg (213 lb 11.2 oz) 96.8 kg (213 lb 6.4 oz) 97 kg (213 lb 14.4 oz)    Intake/Output:   Intake/Output Summary (Last 24 hours) at 01/09/16 0825 Last data filed at 01/09/16 0700  Gross per 24 hour  Intake          1017.26 ml  Output             1850 ml  Net          -832.74 ml     Physical Exam: General: No resp difficulty. In the chair watching TV HEENT: normal x for poor dentition Neck: supple. JVP 8-9~9  Carotids 2+ bilat; no bruits. No lymphadenopathy or thryomegaly appreciated. Cor: PMI lateral. Mildly tachy, regular rate & rhythm. No rubs, gallops or murmurs. Lungs: Decreased in the bases.   Abdomen: soft, nontender, non distended. No hepatosplenomegaly. No bruits or masses. Good bowel sounds. Extremities: no cyanosis, clubbing, rash, LLE no edema. RLE trace edema.   Neuro: alert & orientedx3, cranial nerves grossly intact. moves all 4 extremities w/o difficulty. Affect pleasant   Telemetry:  Afib 90-100s   Labs: Basic Metabolic Panel:  Recent Labs Lab 01/04/16 0304 01/05/16 0343 01/06/16 0546 01/07/16 0445 01/08/16 0326 01/09/16 0356  NA 128* 129* 127* 125* 122* 122*  K 4.7 4.7 4.5 4.4 4.4 4.4  CL 88* 85* 82*  79* 79* 81*  CO2 28 32 32 33* 30 31  GLUCOSE 257* 188* 173* 310* 245* 293*  BUN 44* 44* 49* 61* 67* 64*  CREATININE 1.80* 1.88* 1.80* 2.11* 2.10* 1.73*  CALCIUM 8.7* 9.2 9.3 9.2 9.2 9.0  MG 2.0  --   --   --   --   --     Liver Function Tests:  Recent Labs Lab 01/02/16 0840  AST 17  ALT 11*  ALKPHOS 148*  BILITOT 1.7*  PROT 7.0  ALBUMIN 3.3*   No results for input(s): LIPASE, AMYLASE in the last 168 hours. No results for input(s): AMMONIA in the last 168 hours.  CBC:  Recent Labs Lab 01/02/16 0840 01/06/16 0546 01/08/16 0326 01/09/16 0356  WBC 8.5 9.1 9.0 8.1  NEUTROABS 6.4  --   --   --   HGB 12.3* 13.3 13.6 13.6  HCT 43.3 43.1 42.4 43.0  MCV 85.2 80.1 78.5 79.5  PLT 234 236 226 220    Cardiac Enzymes:  Recent Labs Lab 01/02/16 0840  TROPONINI 0.05*    BNP: BNP (last 3 results)  Recent Labs  08/15/15 1447 11/08/15 1450 01/01/16 1603  BNP 546.6* 748.2* 568.6*    ProBNP (last 3 results) No  results for input(s): PROBNP in the last 8760 hours.    Other results:  Imaging: No results found.   Medications:     Scheduled Medications: . amiodarone  200 mg Oral Daily  . arformoterol  15 mcg Nebulization BID  . atorvastatin  40 mg Oral q1800  . budesonide (PULMICORT) nebulizer solution  0.5 mg Nebulization BID  . carvedilol  3.125 mg Oral BID WC  . digoxin  0.0625 mg Oral Daily  . ferrous sulfate  325 mg Oral TID WC  . gabapentin  300 mg Oral BID  . Influenza vac split quadrivalent PF  0.5 mL Intramuscular Tomorrow-1000  . insulin aspart  0-20 Units Subcutaneous TID WC  . insulin aspart  0-5 Units Subcutaneous QHS  . insulin aspart  5 Units Subcutaneous TID WC  . nicotine  21 mg Transdermal Daily  . rivaroxaban  20 mg Oral QAC supper  . sodium chloride flush  3 mL Intravenous Q12H  . spironolactone  25 mg Oral Daily    Infusions: . milrinone 0.125 mcg/kg/min (01/09/16 0028)    PRN Medications: sodium chloride, albuterol,  ipratropium-albuterol, magnesium hydroxide, ondansetron (ZOFRAN) IV, sodium chloride flush   Assessment:  1. Acute on chronic systolic HF    --echo 8/17 EF 15-72% 2. Chronic Afib 3. DM2, uncontrolled 4. H/o GI bleed 5. Tobacco abuse 6. CAD s/p CABG in 2014 at [facility] 7. 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 Non-compliance 9. Hyponatremia   Plan/Discussion:    Admitted with marked volume overload in the setting of medication noncompliance.   Volume status stable. Overall diuresed 17 pounds. Restart torsemide 80 mg twice a day. Stop milrinone. Creatinine trending down 2.1>1.7. BMET in am.  Sodium remains low 122.   Digoxin level ok.   He has chronic atrial arrhythmias (fibrillation/flutter).  Continue amiodarone 200 mg daily. This was cut back due to a tremor. On Xarelto.   Cardiac rehab following.    Length of Stay: 7  Amy Clegg NP-C  01/09/2016, 8:25 AM  Advanced Heart Failure Team Pager [phone] (M-F; 7a - 4p)  Please contact [facility] for night-coverage after hours (4p -7a ) and weekends on [email or web address]  Patient seen and examined with Tonye Becket, NP. We discussed all aspects of the encounter. I agree with the assessment and plan as stated above.   Improved today. Renal function better. Weight down. Will stop milrinone. Switch back to po diuretics. Continues with asymptomatic hyponatremia. Will follow. Possibly home in am. Continue amio and Xarelto for AF.  Kammi Hechler,MD 2:48 PM

## 2016-01-10 ENCOUNTER — Telehealth: Payer: Self-pay

## 2016-01-10 DIAGNOSIS — J810 Acute pulmonary edema: Secondary | ICD-10-CM

## 2016-01-10 LAB — BASIC METABOLIC PANEL
ANION GAP: 16 — AB (ref 5–15)
BUN: 71 mg/dL — ABNORMAL HIGH (ref 6–20)
CALCIUM: 9.6 mg/dL (ref 8.9–10.3)
CO2: 27 mmol/L (ref 22–32)
Chloride: 80 mmol/L — ABNORMAL LOW (ref 101–111)
Creatinine, Ser: 1.87 mg/dL — ABNORMAL HIGH (ref 0.61–1.24)
GFR, EST AFRICAN AMERICAN: 44 mL/min — AB (ref >60)
GFR, EST NON AFRICAN AMERICAN: 38 mL/min — AB (ref >60)
Glucose, Bld: 48 mg/dL — ABNORMAL LOW (ref 65–99)
POTASSIUM: 4.1 mmol/L (ref 3.5–5.1)
Sodium: 123 mmol/L — ABNORMAL LOW (ref 135–145)

## 2016-01-10 LAB — GLUCOSE, CAPILLARY
GLUCOSE-CAPILLARY: 120 mg/dL — AB (ref 65–99)
GLUCOSE-CAPILLARY: 253 mg/dL — AB (ref 65–99)
Glucose-Capillary: 314 mg/dL — ABNORMAL HIGH (ref 65–99)
Glucose-Capillary: 188 mg/dL — ABNORMAL HIGH (ref 65–99)

## 2016-01-10 LAB — CBC
HCT: 45.3 % (ref 39.0–52.0)
Hemoglobin: 14.5 g/dL (ref 13.0–17.0)
MCH: 25.1 pg — ABNORMAL LOW (ref 26.0–34.0)
MCHC: 32 g/dL (ref 30.0–36.0)
MCV: 78.4 fL (ref 78.0–100.0)
PLATELETS: 326 10*3/uL (ref 150–400)
RBC: 5.78 MIL/uL (ref 4.22–5.81)
RDW: 18.1 % — AB (ref 11.5–15.5)
WBC: 13.7 10*3/uL — AB (ref 4.0–10.5)

## 2016-01-10 MED ORDER — ACETAMINOPHEN 325 MG PO TABS: 650.0000 mg | ORAL_TABLET | Freq: Four times a day (QID) | ORAL | Status: DC | PRN

## 2016-01-10 NOTE — Telephone Encounter (Signed)
The patient's Transitional Care Clinic appointment at [facility] has been rescheduled to 01/16/16 @ 1400 as he is still hospitalized. This information has been updated on the AVS.   Call placed to Lenna Gilford, [facility] # [facility] # [phone] to inform her of the appointment change. Voicemail message left with CM call back # [phone].

## 2016-01-10 NOTE — Progress Notes (Signed)
CARDIAC REHAB PHASE I   PRE:  Rate/Rhythm: 104 ST    BP: sitting 105/70    SaO2: 94 RA  MODE:  Ambulation: 320 ft   POST:  Rate/Rhythm: 98 SR    BP: sitting 113/68     SaO2: 93 RA  Pt more alert today. Able to walk with RW, gait belt, supervision assist for safety. Pt slightly hit two objects in hall on left side but otherwise he was safe. Much improved from the last few days when he struggled to process safe ways of moving around in room. Pt likes using RW here however sts he does not need it for home. We tried to take RW away to access how he does without it but he refused. Ultimately he decided that maybe he would use a RW at home. Gave pt reminders of low sodium diet. He voiced understanding.  [phone]   Harriet Masson CES, ACSM 01/10/2016 2:05 PM

## 2016-01-10 NOTE — Progress Notes (Addendum)
Patient ID: Jacob [name], male   DOB: [birth year], 60 y.o.   MRN: [id number]    Advanced Heart Failure Rounding Note   Subjective:    Admitted with marked volume overload.   Finished IV lasix 01/08/16.  Restarted on po torsemide 01/09/16. Milrinone stopped 01/09/16.  Not feeling as good today.  More tired. States he is coughing. Thinks he "caught a cold last night."  Creatinine 2.1 -> 1.7 -> 1.87  Objective:   Weight Range:  Vital Signs:   Temp:  [97.3 F (36.3 C)-97.8 F (36.6 C)] 97.8 F (36.6 C) (10/18 1140) Pulse Rate:  [70-100] 86 (10/18 1140) Resp:  [18] 18 (10/18 1140) BP: (101-126)/(65-76) 122/76 (10/18 1140) SpO2:  [93 %-100 %] 94 % (10/18 1140) Weight:  [209 lb 8 oz (95 kg)] 209 lb 8 oz (95 kg) (10/18 0543) Last BM Date: 01/05/16  Weight change: Filed Weights   01/08/16 0552 01/09/16 0430 01/10/16 0543  Weight: 213 lb 6.4 oz (96.8 kg) 213 lb 14.4 oz (97 kg) 209 lb 8 oz (95 kg)    Intake/Output:   Intake/Output Summary (Last 24 hours) at 01/10/16 1226 Last data filed at 01/10/16 1100  Gross per 24 hour  Intake             1040 ml  Output             2450 ml  Net            -1410 ml     Physical Exam: General: Seated in Recliner. NAD.  HEENT: normal x for poor dentition Neck: supple. JVP  8 cm. Carotid.  2+ bilat; no bruits. No thyromegaly or nodule noted.  Cor: PMI lateral. , Irregularly irregular.  No M/G/R Lungs: Mildly diminished basilar sounds.  Abdomen: soft, NT, ND, no HSM. No bruits or masses. +BS  Extremities: no cyanosis, clubbing, rash, LLE no edema. RLE trace edema.   Neuro: alert & orientedx3, cranial nerves grossly intact. moves all 4 extremities w/o difficulty. Affect pleasant   Telemetry:  Afib 90-100s   Labs: Basic Metabolic Panel:  Recent Labs Lab 01/04/16 0304  01/06/16 0546 01/07/16 0445 01/08/16 0326 01/09/16 0356 01/10/16 0346  NA 128*  < > 127* 125* 122* 122* 123*  K 4.7  < > 4.5 4.4 4.4 4.4 4.1  CL 88*  < > 82* 79*  79* 81* 80*  CO2 28  < > 32 33* 30 31 27   GLUCOSE 257*  < > 173* 310* 245* 293* 48*  BUN 44*  < > 49* 61* 67* 64* 71*  CREATININE 1.80*  < > 1.80* 2.11* 2.10* 1.73* 1.87*  CALCIUM 8.7*  < > 9.3 9.2 9.2 9.0 9.6  MG 2.0  --   --   --   --   --   --   < > = values in this interval not displayed.  Liver Function Tests: No results for input(s): AST, ALT, ALKPHOS, BILITOT, PROT, ALBUMIN in the last 168 hours. No results for input(s): LIPASE, AMYLASE in the last 168 hours. No results for input(s): AMMONIA in the last 168 hours.  CBC:  Recent Labs Lab 01/06/16 0546 01/08/16 0326 01/09/16 0356 01/10/16 0346  WBC 9.1 9.0 8.1 13.7*  HGB 13.3 13.6 13.6 14.5  HCT 43.1 42.4 43.0 45.3  MCV 80.1 78.5 79.5 78.4  PLT 236 226 220 326    Cardiac Enzymes: No results for input(s): CKTOTAL, CKMB, CKMBINDEX, TROPONINI in the last 168 hours.  BNP: BNP (  last 3 results)  Recent Labs  08/15/15 1447 11/08/15 1450 01/01/16 1603  BNP 546.6* 748.2* 568.6*    ProBNP (last 3 results) No results for input(s): PROBNP in the last 8760 hours.    Other results:  Imaging: No results found.   Medications:     Scheduled Medications: . amiodarone  200 mg Oral Daily  . arformoterol  15 mcg Nebulization BID  . atorvastatin  40 mg Oral q1800  . budesonide (PULMICORT) nebulizer solution  0.5 mg Nebulization BID  . carvedilol  3.125 mg Oral BID WC  . digoxin  0.0625 mg Oral Daily  . ferrous sulfate  325 mg Oral TID WC  . gabapentin  300 mg Oral BID  . Influenza vac split quadrivalent PF  0.5 mL Intramuscular Tomorrow-1000  . insulin aspart  0-20 Units Subcutaneous TID WC  . insulin aspart  0-5 Units Subcutaneous QHS  . insulin aspart  5 Units Subcutaneous TID WC  . insulin glargine  15 Units Subcutaneous QHS  . nicotine  21 mg Transdermal Daily  . rivaroxaban  20 mg Oral QAC supper  . sodium chloride flush  3 mL Intravenous Q12H  . spironolactone  25 mg Oral Daily  . torsemide  80 mg Oral  BID    Infusions:    PRN Medications: sodium chloride, albuterol, ipratropium-albuterol, magnesium hydroxide, ondansetron (ZOFRAN) IV, sodium chloride flush   Assessment:  1. Acute on chronic systolic HF    --echo 8/17 EF 16-10%25-30% 2. Chronic Afib 3. DM2, uncontrolled 4. H/o GI bleed 5. Tobacco abuse 6. [facility] 7. 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 Non-compliance 9. Hyponatremia   Plan/Discussion:    Admitted with marked volume overload in the setting of medication noncompliance.   Volume status stable. Overall has diuresed 21 lbs. Now back on torsemide 80 mg twice a day and off milrinone.   Creatinine up slightly 2.1 > 1.7 > 1.87. Sodium 123   Digoxin level 0.4 on 01/06/16.  He has chronic atrial arrhythmias (fibrillation/flutter).  Continue amiodarone 200 mg daily. This was cut back due to a tremor. On Xarelto.   Cardiac rehab following.    He is feeling slightly worse today off milrinone and back on po diuretics. Creatine with mild uptrend.  Will watch over night with labs in am.  Hopefully home tomorrow.   Length of Stay: 8  Graciella FreerMichael Andrew Tillery PA-C  01/10/2016, 12:26 PM  Advanced Heart Failure Team Pager [phone] (M-F; 7a - 4p)  Please contact [facility] for night-coverage after hours (4p -7a ) and weekends on [email or web address]  Patient seen and examined with Otilio SaberAndy Tillery, PA-C. We discussed all aspects of the encounter. I agree with the assessment and plan as stated above.   Volume status looks ok but he feels worse off of milrinone and renal function getting worse. Will watch him for one more day. Continue oral diuretics. If worse tomorrow may need RHC. Continue amio and Xarelto for AF.   Debany Vantol,MD 11:26 PM

## 2016-01-10 NOTE — Progress Notes (Signed)
Physical Therapy Treatment Patient Details Name: Jacob PunchesYahia Schwabe MRN: [id number] DOB: 14-Nov-1955 Today's Date: 01/10/2016    History of Present Illness Patient is a 60 yo male admitted 01/01/16 with dyspnea, chest pain, LE edema, increased weight.  Patient with CHF exacerbation.    PMH:  Afib, HTN, CHF, EF 25-35%, CAD, MI, CABG, pacemaker, DM, COPD, obesity    PT Comments    Walked today with the goal of discerning best assistive device for safety, stability, and efficiency with amb; Ultimately I favor the RW still for stability; [name] is showing more insight into balance issues and rewuested RW over cane; VSS during amb   Follow Up Recommendations  Outpatient PT (for cardiac rehab; consider Cardiac Rehab Phase2)     Equipment Recommendations  Rolling walker with 5" wheels    Recommendations for Other Services       Precautions / Restrictions Precautions Precautions: Fall    Mobility  Bed Mobility                  Transfers Overall transfer level: Needs assistance Equipment used: None Transfers: Sit to/from Stand Sit to Stand: Supervision         General transfer comment: Heavily reliant on UEs to push and takes a moment to steady at initial stanc  Ambulation/Gait Ambulation/Gait assistance: Min guard;Supervision Ambulation Distance (Feet): 200 Feet Assistive device: Straight cane;Rolling walker (2 wheeled) Gait Pattern/deviations: Step-through pattern Gait velocity: reduced   General Gait Details: Cues to self-monitor for activity tolerance; Attempted with cane, however difficulty with sequencing, and ultimately less steady; better with RW; cues for RW proximity   Stairs            Wheelchair Mobility    Modified Rankin (Stroke Patients Only)       Balance     Sitting balance-Leahy Scale: Good       Standing balance-Leahy Scale: Fair                      Cognition Arousal/Alertness: Awake/alert Behavior During Therapy: WFL  for tasks assessed/performed Overall Cognitive Status: Within Functional Limits for tasks assessed                      Exercises      General Comments        Pertinent Vitals/Pain Pain Assessment: Faces Faces Pain Scale: Hurts little more Pain Location: R calf after walking; Not significantly tender to palpation; pt tells me he has had this pain for years Pain Descriptors / Indicators: Aching Pain Intervention(s): Monitored during session    Home Living                      Prior Function            PT Goals (current goals can now be found in the care plan section) Acute Rehab PT Goals Patient Stated Goal: To walk more PT Goal Formulation: With patient Time For Goal Achievement: 01/12/16 Potential to Achieve Goals: Good Progress towards PT goals: Progressing toward goals    Frequency    Min 3X/week      PT Plan Current plan remains appropriate    Co-evaluation             End of Session Equipment Utilized During Treatment: Gait belt Activity Tolerance: Patient tolerated treatment well Patient left: in chair;with call bell/phone within reach     Time: [id number] PT Time Calculation (min) (ACUTE ONLY):  20 min  Charges:  $Gait Training: 8-22 mins                    G Codes:      Van Clines Hamff 01/10/2016, 12:16 PM  Van Clines, [place]  Acute Rehabilitation Services Pager [phone] Office [phone]

## 2016-01-10 NOTE — Progress Notes (Signed)
PROGRESS NOTE   Jacob [name]  WUJ:[id number] DOB: 1955/12/10 DOA: 01/01/2016  PCP: [name], MD   Brief Narrative:  60 y.o. 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 He complains of substernal chest pain that he reports is similar to previous pain.  Associated symptoms include weight gain of 12 pounds, swelling of the legs/abdomen, PND, dyspnea on exertion, sleeps with 2-3 pillows, and orthopnea. Patient reports that he takes his medications as prescribed. However, patient then states that he has been out of digoxin and Bidil for at least 2 weeks now.  Patient's followed as an outpatient setting by Dr. Gala Romney. Last hospitalization was 8/16- 8/22 at [facility] with similar symptoms, and reported to be out of medications.  Assessment & Plan:  Acute on chronic combined systolic and diastolic CHF - Last EF 25-30% in 10/2015 with diffuse hypokinesis - volume status improving; cardiology dictating diuresis and treatment - weight trend in the past 72 hours: 227 --> 220 --> 216 --> 213 -->209 lbs this AM - cardiology team following, pt was on lasix 80 mg TID IV, metolazone 5 mg BID and Milrinone 0.25 mcg - pt now transitioned to Torsemide 80 mg bid, Cr is trending down 2.1 --> 1.97, milrinone stopped now per cardiology  - will repeat BMP in AM -hopefully home in am  Atrial fibrillation on chronic anticoagulation therapy - continue to Xarelto, Amiodarone 200 mg BID - added back on digoxin - HR at target range  -cardiology on board, will follow rec's -CHADsVASC score 3  Elevated troponin level/chest pain - resolved chest pain  - follow up on cardiology recommendations   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 - Continue to monitor  - no chest pain this AM - keep on tele for now  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  - holding oral regimen, pt very clear he will not take insulin on discharge so our best option to encourage medical compliance with oral antihyperglycemics and dietary restrictions  - Continue Gabapentin - will continue Lantus while here 15 U QHS - on discharge, continue with Glipizide   COPD, without acute exacerbation -continue nebulized budesonide and Brovana -Duonebs q 4hrs prn Sob/wheezing -advise to quit smoking  Essential hypertension - continue current medication regimen: Coreg 3.125 mg BID, Torsemide, Spironolactone (dose as per cardiology recommendations) -BP stable -will continue heart healthy diet    Chronic kidney disease stage III, hyponatremia  - Creatinine has ranged from 1.14- 1.99 over the last few months - Cr now trending down and essentially stable - BMP in AM to be repeated   Elevated transaminases - Elevated AST and AP. Suspect secondary to passive congestion from CHF exacerbation. - Continue to monitor intermittently -no icterus appreciated on exam  Tobacco abuse  - Nicotine patch - advise to quit smoking cessation counseling provided  Morbid obesity - pt meets criteria for morbid obesity with BMI > 35 and underlying DM, HTN, CAD - Body mass index is 36.73 kg/m. -low calorie diet and exercise discussed with patient  DVT prophylaxis:   SCD's, Xarelto  Code Status:  Full Code  Family Communication:  Patient   Disposition Plan:   Likely home in AM if cardiology clears   Consultants:   Cardiology   Subjective: No CP and no SOB. Denies orthopnea.  Patient with mild HA's.  Objective: Vitals:   01/10/16 0941 01/10/16 0952 01/10/16 1140 01/10/16 1715  BP:   122/76 116/78  Pulse:  100 86 (!) 102  Resp:   18   Temp:   97.8 F (36.6 C)   TempSrc:   Oral   SpO2: 93%  94%   Weight:      Height:        Intake/Output Summary (Last 24 hours) at 01/10/16 1801 Last  data filed at 01/10/16 1429  Gross per 24 hour  Intake              880 ml  Output             1750 ml  Net             -870 ml   Filed Weights   01/08/16 0552 01/09/16 0430 01/10/16 0543  Weight: 96.8 kg (213 lb 6.4 oz) 97 kg (213 lb 14.4 oz) 95 kg (209 lb 8 oz)    Examination:  General exam: AAOX3; in no distress. Denies CP and reported just some mild HA. patient denies orthopnea. Even overall improved and essentially euvolemic; reported not feeling to good after milrinone stopped.  Respiratory system: improved air movement, no frank crackles on exam Cardiovascular system: iRR. + JVD, no murmurs, rubs, gallops or clicks. +2-3 bilateral LE edema  Gastrointestinal system: Abdomen is nondistended, soft and nontender. No organomegaly or masses felt.   Data Reviewed: I have personally reviewed following labs and imaging studies  CBC:  Recent Labs Lab 01/06/16 0546 01/08/16 0326 01/09/16 0356 01/10/16 0346  WBC 9.1 9.0 8.1 13.7*  HGB 13.3 13.6 13.6 14.5  HCT 43.1 42.4 43.0 45.3  MCV 80.1 78.5 79.5 78.4  PLT 236 226 220 326   Basic Metabolic Panel:  Recent Labs Lab 01/04/16 0304  01/06/16 0546 01/07/16 0445 01/08/16 0326 01/09/16 0356 01/10/16 0346  NA 128*  < > 127* 125* 122* 122* 123*  K 4.7  < > 4.5 4.4 4.4 4.4 4.1  CL 88*  < > 82* 79* 79* 81* 80*  CO2 28  < > 32 33* 30 31 27   GLUCOSE 257*  < > 173* 310* 245* 293* 48*  BUN 44*  < > 49* 61* 67* 64* 71*  CREATININE 1.80*  < > 1.80* 2.11* 2.10* 1.73* 1.87*  CALCIUM 8.7*  < > 9.3 9.2 9.2 9.0 9.6  MG 2.0  --   --   --   --   --   --   < > = values in this interval not displayed. CBG:  Recent Labs Lab 01/09/16 1646 01/09/16 2122 01/10/16 0536 01/10/16 1114 01/10/16 1617  GLUCAP 243* 465* 120* 253* 314*   Urine analysis:    Component Value Date/Time   COLORURINE YELLOW 12/01/2014 1300   APPEARANCEUR CLEAR 12/01/2014 1300   LABSPEC 1.009 12/01/2014 1300   PHURINE 5.5 12/01/2014 1300   GLUCOSEU NEGATIVE  12/01/2014 1300   HGBUR NEGATIVE 12/01/2014 1300   BILIRUBINUR NEGATIVE 12/01/2014 1300   KETONESUR NEGATIVE 12/01/2014 1300   PROTEINUR NEGATIVE 12/01/2014 1300   UROBILINOGEN 1.0 12/01/2014 1300   NITRITE NEGATIVE 12/01/2014 1300   LEUKOCYTESUR NEGATIVE 12/01/2014 1300    Anti-infectives    None     Radiology Studies: No results found.  Scheduled Meds: . amiodarone  200 mg Oral Daily  . arformoterol  15 mcg Nebulization BID  . atorvastatin  40 mg Oral q1800  . budesonide (PULMICORT) nebulizer  solution  0.5 mg Nebulization BID  . carvedilol  3.125 mg Oral BID WC  . digoxin  0.0625 mg Oral Daily  . ferrous sulfate  325 mg Oral TID WC  . gabapentin  300 mg Oral BID  . Influenza vac split quadrivalent PF  0.5 mL Intramuscular Tomorrow-1000  . insulin aspart  0-20 Units Subcutaneous TID WC  . insulin aspart  0-5 Units Subcutaneous QHS  . insulin aspart  5 Units Subcutaneous TID WC  . insulin glargine  15 Units Subcutaneous QHS  . nicotine  21 mg Transdermal Daily  . rivaroxaban  20 mg Oral QAC supper  . sodium chloride flush  3 mL Intravenous Q12H  . spironolactone  25 mg Oral Daily  . torsemide  80 mg Oral BID   Continuous Infusions:     LOS: 8 days    Time spent: 25 min    Vassie Loll MD [facility] Pager [phone]  If 7PM-7AM, please contact night-coverage [email or web address] Password [email or web address] 01/10/2016, 6:01 PM

## 2016-01-10 NOTE — Progress Notes (Addendum)
Received a call from CCMD about patient's leads being off, attempted to reconnect leads and pt asked me to wait until he comes out of the BR, CCMD notified of pt's request.

## 2016-01-11 ENCOUNTER — Inpatient Hospital Stay (HOSPITAL_COMMUNITY): Payer: Medicare Other

## 2016-01-11 ENCOUNTER — Inpatient Hospital Stay: Payer: Medicare Other | Admitting: Family Medicine

## 2016-01-11 LAB — CBC WITH DIFFERENTIAL/PLATELET
BASOS PCT: 0 %
Basophils Absolute: 0 10*3/uL (ref 0.0–0.1)
EOS PCT: 1 %
Eosinophils Absolute: 0.1 10*3/uL (ref 0.0–0.7)
HEMATOCRIT: 43.9 % (ref 39.0–52.0)
HEMOGLOBIN: 14.2 g/dL (ref 13.0–17.0)
LYMPHS PCT: 16 %
Lymphs Abs: 1.6 10*3/uL (ref 0.7–4.0)
MCH: 25.2 pg — ABNORMAL LOW (ref 26.0–34.0)
MCHC: 32.3 g/dL (ref 30.0–36.0)
MCV: 77.8 fL — AB (ref 78.0–100.0)
Monocytes Absolute: 0.7 10*3/uL (ref 0.1–1.0)
Monocytes Relative: 7 %
NEUTROS PCT: 76 %
Neutro Abs: 7.9 10*3/uL — ABNORMAL HIGH (ref 1.7–7.7)
Platelets: 251 10*3/uL (ref 150–400)
RBC: 5.64 MIL/uL (ref 4.22–5.81)
RDW: 17.9 % — ABNORMAL HIGH (ref 11.5–15.5)
WBC: 10.3 10*3/uL (ref 4.0–10.5)

## 2016-01-11 LAB — BASIC METABOLIC PANEL
ANION GAP: 13 (ref 5–15)
BUN: 95 mg/dL — ABNORMAL HIGH (ref 6–20)
CHLORIDE: 80 mmol/L — AB (ref 101–111)
CO2: 31 mmol/L (ref 22–32)
Calcium: 9.3 mg/dL (ref 8.9–10.3)
Creatinine, Ser: 2.08 mg/dL — ABNORMAL HIGH (ref 0.61–1.24)
GFR calc non Af Amer: 33 mL/min — ABNORMAL LOW (ref >60)
GFR, EST AFRICAN AMERICAN: 39 mL/min — AB (ref >60)
Glucose, Bld: 212 mg/dL — ABNORMAL HIGH (ref 65–99)
Potassium: 4.5 mmol/L (ref 3.5–5.1)
Sodium: 124 mmol/L — ABNORMAL LOW (ref 135–145)

## 2016-01-11 LAB — COOXEMETRY PANEL
Carboxyhemoglobin: 1.9 % — ABNORMAL HIGH (ref 0.5–1.5)
METHEMOGLOBIN: 0.8 % (ref 0.0–1.5)
O2 Saturation: 64.3 %
Total hemoglobin: 13.7 g/dL (ref 12.0–16.0)

## 2016-01-11 LAB — GLUCOSE, CAPILLARY
GLUCOSE-CAPILLARY: 195 mg/dL — AB (ref 65–99)
GLUCOSE-CAPILLARY: 293 mg/dL — AB (ref 65–99)
Glucose-Capillary: 340 mg/dL — ABNORMAL HIGH (ref 65–99)
Glucose-Capillary: 169 mg/dL — ABNORMAL HIGH (ref 65–99)

## 2016-01-11 MED ORDER — ISOSORBIDE MONONITRATE ER 30 MG PO TB24
30.0000 mg | ORAL_TABLET | Freq: Every day | ORAL | Status: DC
  Administered 2016-01-11 – 2016-01-12 (×2): 30 mg via ORAL
  Filled 2016-01-11 (×3): qty 1

## 2016-01-11 MED ORDER — HYDRALAZINE HCL 25 MG PO TABS
12.5000 mg | ORAL_TABLET | Freq: Four times a day (QID) | ORAL | Status: DC
  Administered 2016-01-11 – 2016-01-12 (×6): 12.5 mg via ORAL
  Filled 2016-01-11 (×7): qty 1

## 2016-01-11 MED ORDER — SODIUM CHLORIDE 0.9% FLUSH: 10.0000 mL | INTRAVENOUS | Status: DC | PRN

## 2016-01-11 NOTE — Progress Notes (Signed)
Inpatient Diabetes Program Recommendations  AACE/ADA: New Consensus Statement on Inpatient Glycemic Control (2015)  Target Ranges:  Prepandial:   less than 140 mg/dL      Peak postprandial:   less than 180 mg/dL (1-2 hours)      Critically ill patients:  140 - 180 mg/dL   Results for Jacob [name], Jacob [name] (MRN [id number]) as of 01/11/2016 11:07  Ref. Range 01/10/2016 05:36 01/10/2016 11:14 01/10/2016 16:17 01/10/2016 20:19 01/11/2016 05:41  Glucose-Capillary Latest Ref Range: 65 - 99 mg/dL 767 (H) 209 (H) 470 (H) 188 (H) 195 (H)    Review of Glycemic Control  Diabetes history: DM2 Outpatient Diabetes medications: Glipizide 10 mg BID Current orders for Inpatient glycemic control: Novolog 0-20 units TIDAC, 0-5 units QHS; Novolog 5 units TID meal coverage, Lantus 15 units QHS  Inpatient Diabetes Program Recommendations: Fasting CBG's better with Lantus 15 units QHS.  Will continue to follow.  Thank you,  Kristine Linea, RN, BSN Diabetes Coordinator Inpatient Diabetes Program [phone] (Team Pager)

## 2016-01-11 NOTE — Progress Notes (Signed)
PROGRESS NOTE   Jacob [name]  ZOX:[id number] DOB: [birth year] DOA: 01/01/2016  PCP: [name], MD   Brief Narrative:  60 y.o. 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 He complains of substernal chest pain that he reports is similar to previous pain.  Associated symptoms include weight gain of 12 pounds, swelling of the legs/abdomen, PND, dyspnea on exertion, sleeps with 2-3 pillows, and orthopnea. Patient reports that he takes his medications as prescribed. However, patient then states that he has been out of digoxin and Bidil for at least 2 weeks now.  Patient's followed as an outpatient setting by Dr. Gala Romney. Last hospitalization was 8/16- 8/22 at [facility] with similar symptoms, and reported to be out of medications.  Assessment & Plan:  Acute on chronic combined systolic and diastolic CHF - Last EF 25-30% in 10/2015 with diffuse hypokinesis - volume status improving; cardiology dictating diuresis and treatment - weight trend in the past 72 hours: 227 --> 220 --> 216 --> 213 -->209 -->208 lbs this AM - cardiology team following, PICC line would be place to assess coox and if need it will required RHC. - pt now transitioned to Torsemide 80 mg bid, but given trending up in his Cr and decrease output, diuretics held today.  -will also hold b-blockers -will add low dose hydralazine and nitrates -Cr is trending up again after milrinone stopped. 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  - holding oral regimen, pt very clear he will not take insulin on discharge so our best option to encourage medical compliance with oral antihyperglycemics and dietary restrictions  - Continue Gabapentin - will continue Lantus while here 15 U QHS - on discharge, continue with Glipizide   COPD, without acute exacerbation -continue nebulized budesonide and Brovana -Duonebs q 4hrs prn Sob/wheezing -advise to quit smoking  Essential hypertension -BP stable -will continue heart healthy diet   -per cardiology will add low dose hydralazine and nitrate; coreg has been discontinued and diuretics on hold for now.  Chronic kidney disease stage III, hyponatremia  - Creatinine has ranged from 1.14- 1.99 over the last few months - Cr now trending up again, 2.08 this morning - BMP in AM to be repeated  -diuretics on hold today along with b-blockers   Elevated transaminases - Elevated AST and AP. Suspect secondary to passive congestion from CHF exacerbation. - Continue to monitor intermittently -no icterus appreciated on exam  Tobacco abuse  - Nicotine patch - advise to quit smoking cessation counseling provided  Morbid obesity -pt meets criteria for morbid obesity with BMI > 35 and underlying DM, HTN, CAD -Body mass index is 36.73 kg/m. -low calorie diet and exercise discussed with patient  DVT prophylaxis:   SCD's, Xarelto  Code Status:  Full Code  Family Communication:  Patient  Disposition Plan:   Home when medically stable and clear by cardiology service (Heart failure team)   Consultants:   Cardiology   Subjective: No CP and no SOB at rest. Reports feeling bad overall, w/o energy and endorses decrease urine output.  Objective: Vitals:   01/11/16 0950 01/11/16 0958  01/11/16 1242 01/11/16 1508  BP:  125/70 115/68 114/65  Pulse: 89  (!) 102 96  Resp:   18   Temp:   98.2 F (36.8 C)   TempSrc:   Oral   SpO2:   95%   Weight:      Height:        Intake/Output Summary (Last 24 hours) at 01/11/16 1736 Last data filed at 01/11/16 1719  Gross per 24 hour  Intake             1040 ml  Output             2250 ml  Net            -1210 ml   Filed Weights   01/09/16 0430 01/10/16 0543 01/11/16 0544  Weight: 97 kg (213 lb 14.4 oz) 95 kg (209 lb 8 oz) 94.8 kg (208 lb 14.4 oz)    Examination:  General exam: AAOX3; in no distress. Denies CP; but report feeling bad and endorses decrease in urine output. Patient denies orthopnea.   Respiratory system: improved air movement, no frank crackles on exam Cardiovascular system: iRR. + JVD, no murmurs, rubs, gallops or clicks. +2-3 bilateral LE edema  Gastrointestinal system: Abdomen is nondistended, soft and nontender. No organomegaly or masses felt.   Data Reviewed: I have personally reviewed following labs and imaging studies  CBC:  Recent Labs Lab 01/06/16 0546 01/08/16 0326 01/09/16 0356 01/10/16 0346 01/11/16 0416  WBC 9.1 9.0 8.1 13.7* 10.3  NEUTROABS  --   --   --   --  7.9*  HGB 13.3 13.6 13.6 14.5 14.2  HCT 43.1 42.4 43.0 45.3 43.9  MCV 80.1 78.5 79.5 78.4 77.8*  PLT 236 226 220 326 251   Basic Metabolic Panel:  Recent Labs Lab 01/07/16 0445 01/08/16 0326 01/09/16 0356 01/10/16 0346 01/11/16 0416  NA 125* 122* 122* 123* 124*  K 4.4 4.4 4.4 4.1 4.5  CL 79* 79* 81* 80* 80*  CO2 33* 30 31 27 31   GLUCOSE 310* 245* 293* 48* 212*  BUN 61* 67* 64* 71* 95*  CREATININE 2.11* 2.10* 1.73* 1.87* 2.08*  CALCIUM 9.2 9.2 9.0 9.6 9.3   CBG:  Recent Labs Lab 01/10/16 1617 01/10/16 2019 01/11/16 0541 01/11/16 1131 01/11/16 1647  GLUCAP 314* 188* 195* 293* 340*   Urine analysis:    Component Value Date/Time   COLORURINE YELLOW 12/01/2014 1300   APPEARANCEUR CLEAR 12/01/2014 1300    LABSPEC 1.009 12/01/2014 1300   PHURINE 5.5 12/01/2014 1300   GLUCOSEU NEGATIVE 12/01/2014 1300   HGBUR NEGATIVE 12/01/2014 1300   BILIRUBINUR NEGATIVE 12/01/2014 1300   KETONESUR NEGATIVE 12/01/2014 1300   PROTEINUR NEGATIVE 12/01/2014 1300   UROBILINOGEN 1.0 12/01/2014 1300   NITRITE NEGATIVE 12/01/2014 1300   LEUKOCYTESUR NEGATIVE 12/01/2014 1300    Anti-infectives    None     Radiology Studies: Dg Chest Port 1 View  Result Date: 01/11/2016 CLINICAL DATA:  PICC line placement. EXAM: PORTABLE CHEST 1 VIEW COMPARISON:  01/01/2016. FINDINGS: PICC line noted with tip at the cavoatrial junction. Cardiac pacer in stable position. Prior CABG. Cardiomegaly with pulmonary vascular prominence and bilateral interstitial  prominence consistent congestive heart failure. Bilateral carotid vascular calcification. IMPRESSION: 1.  Right PICC line noted with tip at cavoatrial junction. 2. Cardiac pacer in stable position. Prior CABG. Congestive heart failure pulmonary interstitial edema. Electronically Signed   By: Maisie Fushomas  Register   On: 01/11/2016 13:04    Scheduled Meds: . amiodarone  200 mg Oral Daily  . arformoterol  15 mcg Nebulization BID  . atorvastatin  40 mg Oral q1800  . budesonide (PULMICORT) nebulizer solution  0.5 mg Nebulization BID  . digoxin  0.0625 mg Oral Daily  . ferrous sulfate  325 mg Oral TID WC  . gabapentin  300 mg Oral BID  . hydrALAZINE  12.5 mg Oral QID  . Influenza vac split quadrivalent PF  0.5 mL Intramuscular Tomorrow-1000  . insulin aspart  0-20 Units Subcutaneous TID WC  . insulin aspart  0-5 Units Subcutaneous QHS  . insulin aspart  5 Units Subcutaneous TID WC  . insulin glargine  15 Units Subcutaneous QHS  . isosorbide mononitrate  30 mg Oral Daily  . nicotine  21 mg Transdermal Daily  . rivaroxaban  20 mg Oral QAC supper  . sodium chloride flush  3 mL Intravenous Q12H  . spironolactone  25 mg Oral Daily   Continuous Infusions:     LOS: 9 days     Time spent: 25 min    Vassie LollMadera, Curt Oatis MD [facility] Pager [phone]  If 7PM-7AM, please contact night-coverage [email or web address] Password [email or web address] 01/11/2016, 5:36 PM

## 2016-01-11 NOTE — Progress Notes (Signed)
Pt very demanding throughout the night for coffee, graham crackers and peanut butter, attempted to educate patient regarding monitoring fluid intake and he refused education.

## 2016-01-11 NOTE — Progress Notes (Signed)
Pt is refusing bed alarm, encouraged patient to call when ambulating.

## 2016-01-11 NOTE — Hospital Discharge Follow-Up (Signed)
Transitional Care Clinic at [facility]:  Patient known to the Transitional Care Clinic at [facility]. Reminded patient of upcoming appointment on 01/16/16 at 1400, and reminded patient he will receive a phone call after discharge to check status. Appointment on AVS. Patient verbalized understanding. Will continue to follow patient's clinical progress closely.  Call placed to Lenna Gilford, Case Manager with [facility] [phone]) to inform her of patient's Transitional Care appointment. [place] appreciative of call.

## 2016-01-11 NOTE — Progress Notes (Signed)
Peripherally Inserted Central Catheter/Midline Placement  The IV Nurse has discussed with the patient and/or persons authorized to consent for the patient, the purpose of this procedure and the potential benefits and risks involved with this procedure.  The benefits include less needle sticks, lab draws from the catheter, and the patient may be discharged home with the catheter. Risks include, but not limited to, infection, bleeding, blood clot (thrombus formation), and puncture of an artery; nerve damage and irregular heartbeat and possibility to perform a PICC exchange if needed/ordered by physician.  Alternatives to this procedure were also discussed.  Bard Power PICC patient education guide, fact sheet on infection prevention and patient information card has been provided to patient /or left at bedside.    PICC/Midline Placement Documentation        [name] 01/11/2016, 12:00 PM Consent obtained by Lazarus Gowda, RN

## 2016-01-11 NOTE — Progress Notes (Signed)
Patient in recliner asleep. He was easily awakened for focused assessment. No acute distress noted at this time in the patient.

## 2016-01-11 NOTE — Progress Notes (Addendum)
Patient ID: Addai Afshari, male   DOB: Sep 18, 1955, 60 y.o.   MRN: [id number]    Advanced Heart Failure Rounding Note   Subjective:    Admitted with marked volume overload.   Finished IV lasix 01/08/16.  Restarted on po torsemide 01/09/16. Milrinone stopped 01/09/16.  Started feeling worse off milrinone 01/10/16. Creatinine trending up still.   Feeling "worse" again today. No energy. No SOB at rest.  Creatinine 2.1 -> 1.7 -> 1.87 -> 2.08  Objective:   Weight Range:  Vital Signs:   Temp:  [97.7 F (36.5 C)-98.5 F (36.9 C)] 97.7 F (36.5 C) (10/19 0544) Pulse Rate:  [86-102] 101 (10/19 0544) Resp:  [18] 18 (10/19 0544) BP: (105-122)/(61-78) 106/61 (10/19 0544) SpO2:  [93 %-100 %] 100 % (10/19 0544) Weight:  [208 lb 14.4 oz (94.8 kg)] 208 lb 14.4 oz (94.8 kg) (10/19 0544) Last BM Date: 01/11/16  Weight change: Filed Weights   01/09/16 0430 01/10/16 0543 01/11/16 0544  Weight: 213 lb 14.4 oz (97 kg) 209 lb 8 oz (95 kg) 208 lb 14.4 oz (94.8 kg)    Intake/Output:   Intake/Output Summary (Last 24 hours) at 01/11/16 0733 Last data filed at 01/11/16 0700  Gross per 24 hour  Intake             1200 ml  Output             2800 ml  Net            -1600 ml     Physical Exam: General: Seated in Recliner. Fatigued appearing. No resp distress HEENT: normal x for poor dentition Neck: supple. JVP 7-8 cm. Carotid.  2+ bilat; no bruits. No thyromegaly or nodule noted.  Cor: PMI lateral. , Irregularly irregular.  No M/G/R appreciated, though heart sounds distant Lungs: Diminished basilar sounds.  Abdomen: soft, NT, ND, no HSM. No bruits or masses. +BS  Extremities: no cyanosis, clubbing, rash. No edema. Cool to the touch.  Neuro: alert & orientedx3, cranial nerves grossly intact. moves all 4 extremities w/o difficulty. Affect pleasant   Telemetry: Reviewed,  Afib 80-90s    Labs: Basic Metabolic Panel:  Recent Labs Lab 01/07/16 0445 01/08/16 0326 01/09/16 0356  01/10/16 0346 01/11/16 0416  NA 125* 122* 122* 123* 124*  K 4.4 4.4 4.4 4.1 4.5  CL 79* 79* 81* 80* 80*  CO2 33* 30 31 27 31   GLUCOSE 310* 245* 293* 48* 212*  BUN 61* 67* 64* 71* 95*  CREATININE 2.11* 2.10* 1.73* 1.87* 2.08*  CALCIUM 9.2 9.2 9.0 9.6 9.3    Liver Function Tests: No results for input(s): AST, ALT, ALKPHOS, BILITOT, PROT, ALBUMIN in the last 168 hours. No results for input(s): LIPASE, AMYLASE in the last 168 hours. No results for input(s): AMMONIA in the last 168 hours.  CBC:  Recent Labs Lab 01/06/16 0546 01/08/16 0326 01/09/16 0356 01/10/16 0346 01/11/16 0416  WBC 9.1 9.0 8.1 13.7* 10.3  NEUTROABS  --   --   --   --  7.9*  HGB 13.3 13.6 13.6 14.5 14.2  HCT 43.1 42.4 43.0 45.3 43.9  MCV 80.1 78.5 79.5 78.4 77.8*  PLT 236 226 220 326 251    Cardiac Enzymes: No results for input(s): CKTOTAL, CKMB, CKMBINDEX, TROPONINI in the last 168 hours.  BNP: BNP (last 3 results)  Recent Labs  08/15/15 1447 11/08/15 1450 01/01/16 1603  BNP 546.6* 748.2* 568.6*    ProBNP (last 3 results) No results for input(s): PROBNP  in the last 8760 hours.    Other results:  Imaging: No results found.   Medications:     Scheduled Medications: . amiodarone  200 mg Oral Daily  . arformoterol  15 mcg Nebulization BID  . atorvastatin  40 mg Oral q1800  . budesonide (PULMICORT) nebulizer solution  0.5 mg Nebulization BID  . carvedilol  3.125 mg Oral BID WC  . digoxin  0.0625 mg Oral Daily  . ferrous sulfate  325 mg Oral TID WC  . gabapentin  300 mg Oral BID  . Influenza vac split quadrivalent PF  0.5 mL Intramuscular Tomorrow-1000  . insulin aspart  0-20 Units Subcutaneous TID WC  . insulin aspart  0-5 Units Subcutaneous QHS  . insulin aspart  5 Units Subcutaneous TID WC  . insulin glargine  15 Units Subcutaneous QHS  . nicotine  21 mg Transdermal Daily  . rivaroxaban  20 mg Oral QAC supper  . sodium chloride flush  3 mL Intravenous Q12H  . spironolactone   25 mg Oral Daily    Infusions:    PRN Medications: sodium chloride, acetaminophen, albuterol, ipratropium-albuterol, magnesium hydroxide, ondansetron (ZOFRAN) IV, sodium chloride flush   Assessment:  1. Acute on chronic systolic HF    --echo 8/17 EF 16-10%25-30% 2. Chronic Afib 3. DM2, uncontrolled 4. H/o GI bleed 5. Tobacco abuse 6. CAD s/p CABG in 2014 at [facility] 7. 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 Non-compliance 9. Hyponatremia   Plan/Discussion:    Admitted with marked volume overload in the setting of medication noncompliance.   Volume status stable. Overall down 22 lbs. Hold torsemide this am.    Now feeling worse with creatinine trending up off milrinone. Cr 1.7 > 1.87 -> 2.08. Sodium 124.   Will see if can have PICC placed this am to check Coox.  If cannot, will proceed with RHC. Will make NPO for now.   Will hold coreg this am as well with ? Low output.   Digoxin level 0.4 on 01/06/16.  He has chronic atrial arrhythmias (fibrillation/flutter).  Continue amiodarone 200 mg daily. This was cut back due to a tremor. On Xarelto.   Cardiac rehab following.    Length of Stay: [address]  Graciella FreerMichael Andrew Tillery PA-C  01/11/2016, 7:33 AM  Advanced Heart Failure Team Pager [phone] (M-F; 7a - 4p)  Please contact [facility] for night-coverage after hours (4p -7a ) and weekends on [email or web address]  ADDENDUM  Pt initially agreed to be made NPO for possible cath, and made NPO at 0743.     Tray was delivered to room and now patient demanding to eat and refusing cath.  Will continue with PICC as originally planned, as will not be able to cath today.   Pt has very poor insight into his disease.   Casimiro NeedleMichael [address]"Andy" Mabenillery, PA-C 01/11/2016 8:32 AM   Patient seen and examined with Otilio SaberAndy Tillery, PA-C. We discussed all aspects of the encounter. I agree with the assessment and plan as stated above.   Volume status improved but seems low output. Agree with placement of PICC and checking co-ox and  CVPs. Can proceed to RHC as needed. Not candidate for home inotropes. Will stop carvedilol. Add low-dose hydralazine/nitrates.   Dylann Gallier,MD 9:24 AM  Addendum:   Co-ox 64%. No need for inotropic support at this time.   Jazlene Bares,MD 5:36 PM

## 2016-01-12 DIAGNOSIS — R0602 Shortness of breath: Secondary | ICD-10-CM

## 2016-01-12 LAB — GLUCOSE, CAPILLARY
GLUCOSE-CAPILLARY: 223 mg/dL — AB (ref 65–99)
Glucose-Capillary: 196 mg/dL — ABNORMAL HIGH (ref 65–99)

## 2016-01-12 LAB — BASIC METABOLIC PANEL
ANION GAP: 12 (ref 5–15)
BUN: 99 mg/dL — AB (ref 6–20)
CALCIUM: 9.2 mg/dL (ref 8.9–10.3)
CO2: 32 mmol/L (ref 22–32)
Chloride: 81 mmol/L — ABNORMAL LOW (ref 101–111)
Creatinine, Ser: 1.75 mg/dL — ABNORMAL HIGH (ref 0.61–1.24)
GFR calc Af Amer: 48 mL/min — ABNORMAL LOW (ref >60)
GFR, EST NON AFRICAN AMERICAN: 41 mL/min — AB (ref >60)
GLUCOSE: 242 mg/dL — AB (ref 65–99)
POTASSIUM: 4.4 mmol/L (ref 3.5–5.1)
SODIUM: 125 mmol/L — AB (ref 135–145)

## 2016-01-12 LAB — COOXEMETRY PANEL
Carboxyhemoglobin: 1.7 % — ABNORMAL HIGH (ref 0.5–1.5)
Methemoglobin: 0.8 % (ref 0.0–1.5)
O2 SAT: 56.7 %
TOTAL HEMOGLOBIN: 13.8 g/dL (ref 12.0–16.0)

## 2016-01-12 MED ORDER — TORSEMIDE 20 MG PO TABS
80.0000 mg | ORAL_TABLET | Freq: Two times a day (BID) | ORAL | Status: DC
  Administered 2016-01-12: 80 mg via ORAL
  Filled 2016-01-12: qty 4

## 2016-01-12 MED ORDER — AMIODARONE HCL 200 MG PO TABS: 200.0000 mg | ORAL_TABLET | Freq: Every day | ORAL | 1 refills | Status: DC

## 2016-01-12 MED ORDER — INSULIN PEN NEEDLE 31G X 5 MM MISC: 3 refills | Status: DC

## 2016-01-12 MED ORDER — NICOTINE 21 MG/24HR TD PT24: 21.0000 mg | MEDICATED_PATCH | Freq: Every day | TRANSDERMAL | 0 refills | Status: DC

## 2016-01-12 MED ORDER — ISOSORBIDE MONONITRATE ER 30 MG PO TB24: 30.0000 mg | ORAL_TABLET | Freq: Every day | ORAL | 1 refills | Status: DC

## 2016-01-12 MED ORDER — INSULIN GLARGINE 100 UNIT/ML SOLOSTAR PEN: 12.0000 [IU] | PEN_INJECTOR | Freq: Every day | SUBCUTANEOUS | 11 refills | Status: DC

## 2016-01-12 MED ORDER — INSULIN ASPART 100 UNIT/ML ~~LOC~~ SOLN
5.0000 [IU] | Freq: Three times a day (TID) | SUBCUTANEOUS | Status: DC
  Administered 2016-01-12 (×2): 5 [IU] via SUBCUTANEOUS

## 2016-01-12 MED ORDER — HYDRALAZINE HCL 25 MG PO TABS: 12.5000 mg | ORAL_TABLET | Freq: Three times a day (TID) | ORAL | 1 refills | Status: DC

## 2016-01-12 MED FILL — ISOSORBIDE MN ER 30 MG TAB: 30 | 30 days supply | Qty: 30 | Fill #0

## 2016-01-12 MED FILL — AMIODARONE HCL 200 MG TAB: 200 | 30 days supply | Qty: 30 | Fill #0

## 2016-01-12 MED FILL — LANTUS SOLOSTAR 100 UNITS/M: 100 | 75 days supply | Qty: 12 | Fill #0

## 2016-01-12 MED FILL — hydrALAZINE HCL 25 MG TABS: 25 | 60 days supply | Qty: 90 | Fill #0

## 2016-01-12 NOTE — Discharge Summary (Signed)
Physician Discharge Summary  Jacob [name] NID:[id number] DOB: 1955-11-19 DOA: 01/01/2016  PCP: [name], MD  Admit date: 01/01/2016 Discharge date: 01/12/2016  Time spent: 35 minutes  Recommendations for Outpatient Follow-up:  1. Repeat BMET to follow electrolytes and renal function  2. Close follow up of his diabetes and further adjustment to hypoglycemic regimen as needed  3. Needs also close follow up by heart failure service    Discharge Diagnoses:  Principal Problem:   Acute on chronic combined systolic and diastolic CHF (congestive heart failure) (HCC) Active Problems:   Essential hypertension   AF (atrial fibrillation) (HCC)   Tobacco abuse   DM (diabetes mellitus), type 2 with peripheral vascular complications (HCC)   Chronic anticoagulation   Acute pulmonary edema (HCC) COPD Obesity  Discharge Condition: stable and improved. Discharge home with instructions to follow up with heart failure service in 1 week. Will also establish care at [facility].  Diet recommendation: heart healthy diet and low carbohydrates   Filed Weights   01/10/16 0543 01/11/16 0544 01/12/16 0524  Weight: 95 kg (209 lb 8 oz) 94.8 kg (208 lb 14.4 oz) 95 kg (209 lb 8 oz)    History of present illness:  As per Dr. Katrinka Blazing H&P written on 01/01/16 59 y.o. 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 He complains of substernal chest pain that he reports is similar to previous pain.  Associated symptoms include weight gain of 12 pounds, swelling of the legs/abdomen, PND, dyspnea on exertion, sleeps with 2-3 pillows, and orthopnea. Patient reports that he takes his medications as prescribed. However, patient then states that he has been out of digoxin and Bidil for at least 2 weeks now.  Patient's followed as an outpatient  setting by Dr. Gala Romney. Last hospitalization was 8/16- 8/22 at [facility] with similar symptoms, and reported to be out of medications.  ED Co is urse: Upon admission into the emergency department patient was evaluated and found to be afebrile, HR from 104-117, respirations 19-31, and all other vitals maintained. Lab work reveals hemoglobin 12.5, sodium 133, potassium 5.7, chloride 97, BUN 26, creatinine 1.61, calcium 8.3, troponin 0.05, glucose 161, troponin 0.05, BNP 568.6. Chest x-ray shows central pulmonary vascular congestion with bilateral pulmonary edema. Patient was given 80 mg of Lasix IV in the ED. TRH called to admit.   Hospital Course:  Acute on chronic combined systolic and diastolic CHF - LastEF 25-30% in 10/2015 with diffuse hypokinesis - volume status improving; cardiology dictating diuresis and treatment - weight trend in the past 72 hours: 227 --> 220 --> 216 --> 213 -->209 lbs this AM - cardiology team following, PICC line would be removed and patient now discharge -coox stable -patient is 21 pounds lighter in compare to weight on admission -instructed to follow low sodium diet and daily  -pt now transitioned to Torsemide 80 mg bid, Imdur and hydralazine. -no b-blocker due to soft BP and low cardiac output.  -no ACE/ARB due to renal failure -will need close follow up of his renal function and electrolytes.  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 -no B-blockers due to low cardiac output and soft BP  Chronic kidney disease stage III, hyponatremia  - Creatinine has ranged from 1.14- 1.99over the last few months - Cr at discharge 1.75 - BMP to be checked at follow up visit with heart failure team  -diuretics resumed at discharge  Elevated transaminases - Elevated AST and AP. Suspect secondary to passive congestion from CHF exacerbation. - Continue to monitor intermittently -no icterus appreciated on exam and no RUQ pain -repeat CMET at follow up visit.  Tobacco abuse  - Nicotine patch prescribed  - advise to quit smoking cessation counseling provided  Morbid obesity -pt meets criteria for morbid obesity with BMI > 35 and underlying DM, HTN, CAD -Body mass index is 36.73 kg/m. -low calorie diet and exercise discussed with patient  Procedures:  See below for x-ray reports   Consultations:  Cardiology (heart failure)  Discharge Exam: Vitals:   01/11/16 1932 01/12/16 0524  BP: 101/64 97/69  Pulse: (!) 52 86  Resp: 18 18  Temp: 97.9 F (36.6 C) 98.1 F (36.7 C)   General exam: AAOX3; in no distress. Denies CP; feeling a lot better and wants to go home. Denies SOB and orthopnea.    Respiratory system: improved air movement, no frank crackles on exam Cardiovascular system: iRR. no murmurs, rubs, gallops or clicks. +1 bilateral LE edema  Gastrointestinal system: Abdomen is nondistended, soft and  nontender. No organomegaly or masses felt.   Discharge Instructions   Discharge Instructions    Amb Referral to Cardiac Rehabilitation    Complete by:  As directed    Diagnosis:  Heart Failure (see criteria below if ordering Phase II)   Heart Failure Type:  Chronic Systolic   Diet - low sodium heart healthy    Complete by:  As directed    Discharge instructions    Complete by:  As directed    Please take medications as prescribed Follow up with heart failure service as instructed Follow heart healthy diet and limit sodium intake to 2 Gram daily; no more than 2L of fluid intake recommended as well. Check weight on daily basis (contcat heart failure team with increase/weight gain of more than 3 pounds overnight and/or more than 5 pounds in a week)     Current Discharge Medication List    START taking these medications   Details  hydrALAZINE (APRESOLINE) 25 MG tablet Take 0.5 tablets (12.5 mg total) by mouth 3 (three) times daily. Qty: 90 tablet, Refills: 1    Insulin Glargine (LANTUS) 100 UNIT/ML Solostar Pen Inject 12 Units into the skin daily at 10 pm. Qty: 15 mL, Refills: 11    Insulin Pen Needle (ADVOCATE INSULIN PEN NEEDLES) 31G X 5 MM MISC Use to inject insulin every night  as instructed Qty: 100 each, Refills: 3    isosorbide mononitrate (IMDUR) 30 MG 24 hr tablet Take 1 tablet (30 mg total) by mouth daily. Qty: 30 tablet, Refills: 1    nicotine (NICODERM CQ - DOSED IN MG/24 HOURS) 21 mg/24hr patch Place 1 patch (21 mg total) onto the skin daily. Qty: 28 patch, Refills: 0      CONTINUE these medications which have CHANGED   Details  amiodarone (PACERONE) 200 MG tablet Take 1 tablet (200 mg total) by mouth daily. Qty: 30 tablet, Refills: 1      CONTINUE these medications which have NOT CHANGED   Details  ACCU-CHEK SOFTCLIX LANCETS lancets Use for once daily testing of blood sugar Qty: 100 each, Refills: 12    albuterol (PROVENTIL HFA;VENTOLIN HFA) 108 (90 Base)  MCG/ACT inhaler Inhale 2 puffs into the lungs every 6 (six) hours as needed for wheezing or shortness of breath. Qty: 1 Inhaler, Refills: 2   Associated Diagnoses: Dyspnea    albuterol (PROVENTIL) (2.5 MG/3ML) 0.083% nebulizer solution Take 3 mLs (2.5 mg total) by nebulization every 6 (six) hours as needed for wheezing or shortness of breath. Qty: 150 mL, Refills: 1    atorvastatin (LIPITOR) 40 MG tablet Take 1 tablet (40 mg total) by mouth daily at 6 PM. Qty: 30 tablet, Refills: 2   Associated Diagnoses: Coronary artery disease involving native artery of transplanted heart without angina pectoris    Blood Glucose Monitoring Suppl (ACCU-CHEK AVIVA) device Use as instructed1 times daily before meals Qty: 1 each, Refills: 0   Associated Diagnoses: Type 2 diabetes mellitus with other circulatory complication, without long-term current use of insulin (HCC)    ferrous sulfate 325 (65 FE) MG EC tablet Take 325 mg by mouth 3 (three) times daily with meals.     Fluticasone-Salmeterol (ADVAIR) 100-50 MCG/DOSE AEPB Inhale 1 puff into the lungs daily.    gabapentin (NEURONTIN) 300 MG capsule Take 1 capsule (300 mg total) by mouth 2 (two) times daily. Qty: 60 capsule, Refills: 5   Associated Diagnoses: Type 2 diabetes mellitus with other circulatory complication, without long-term current use of insulin (HCC)    glipiZIDE (GLUCOTROL) 10 MG tablet Take 1 tablet (10 mg total) by mouth 2 (two) times daily before a meal. Qty: 60 tablet, Refills: 2   Associated Diagnoses: Type 2 diabetes mellitus with other circulatory complication, without long-term current use of insulin (HCC)    glucose blood (ACCU-CHEK AVIVA) test strip Use as instructed for 1 times daily testing of blood sugar Qty: 100 each, Refills: 12    rivaroxaban (XARELTO) 20 MG TABS tablet Take 1 tablet (20 mg total) by mouth daily before supper. Qty: 30 tablet, Refills: 5   Associated Diagnoses: Chronic atrial fibrillation (HCC)     torsemide (DEMADEX) 20 MG tablet Take 4 tablets (80 mg total) by mouth 2 (two) times daily. Qty: 240 tablet, Refills: 3   Associated Diagnoses: Cardiomyopathy, ischemic    acetaminophen-codeine (TYLENOL #3) 300-30 MG tablet Take 1 tablet by mouth every 12 (twelve) hours as needed for moderate pain. Qty: 60 tablet, Refills: 1   Associated Diagnoses: PVD (peripheral vascular disease) (HCC)    digoxin (LANOXIN) 0.125 MG tablet Take 0.5 tablets (0.0625 mg total) by mouth daily. Qty: 15 tablet, Refills: 5    Lancet Devices (ACCU-CHEK SOFTCLIX) lancets Use as instructed for once daily testing of blood sugar Qty: 1 each, Refills: 0      STOP taking these medications  carvedilol (COREG) 3.125 MG tablet      spironolactone (ALDACTONE) 25 MG tablet      isosorbide-hydrALAZINE (BIDIL) 20-37.5 MG tablet        Allergies  Allergen Reactions  . Tape Itching    Paper tape please.   Follow-up Information    [facility]. Go on 01/16/2016.   Specialty:  Internal Medicine Why:  at 2:00pm for an appointment at the [facility] with Dr Venetia Night.  Contact information: [name] [id number] mc [address] [place] [address] [phone]       Tonye Becket, NP Follow up on 01/17/2016.   Specialty:  Cardiology Why:  10:20 [address] Code 4000 Contact information: [address] [place] [address] [phone]           The results of significant diagnostics from this hospitalization (including imaging, microbiology, ancillary and laboratory) are listed below for reference.    Significant Diagnostic Studies: Dg Chest 2 View  Result Date: 01/01/2016 CLINICAL DATA:  Chest pain, shortness of breath. EXAM: CHEST  2 VIEW COMPARISON:  Radiographs of November 08, 2015. FINDINGS: Stable cardiomediastinal silhouette. Status post coronary bypass graft. Left-sided pacemaker is unchanged in position. Central pulmonary vascular congestion is  noted with bilateral diffuse interstitial densities concerning for pulmonary edema. No pneumothorax is noted. Mild right pleural effusion is noted. Bony thorax is unremarkable. IMPRESSION: Central pulmonary vascular congestion with bilateral pulmonary edema is noted. Mild right pleural effusion is noted. Electronically Signed   By: Lupita Raider, M.D.   On: 01/01/2016 16:51   Dg Chest Port 1 View  Result Date: 01/11/2016 CLINICAL DATA:  PICC line placement. EXAM: PORTABLE CHEST 1 VIEW COMPARISON:  01/01/2016. FINDINGS: PICC line noted with tip at the cavoatrial junction. Cardiac pacer in stable position. Prior CABG. Cardiomegaly with pulmonary vascular prominence and bilateral interstitial prominence consistent congestive heart failure. Bilateral carotid vascular calcification. IMPRESSION: 1.  Right PICC line noted with tip at cavoatrial junction. 2. Cardiac pacer in stable position. Prior CABG. Congestive heart failure pulmonary interstitial edema. Electronically Signed   By: Maisie Fus  Register   On: 01/11/2016 13:04   Labs: Basic Metabolic Panel:  Recent Labs Lab 01/08/16 0326 01/09/16 0356 01/10/16 0346 01/11/16 0416 01/12/16 0500  NA 122* 122* 123* 124* 125*  K 4.4 4.4 4.1 4.5 4.4  CL 79* 81* 80* 80* 81*  CO2 30 31 27 31  32  GLUCOSE 245* 293* 48* 212* 242*  BUN 67* 64* 71* 95* 99*  CREATININE 2.10* 1.73* 1.87* 2.08* 1.75*  CALCIUM 9.2 9.0 9.6 9.3 9.2   CBC:  Recent Labs Lab 01/06/16 0546 01/08/16 0326 01/09/16 0356 01/10/16 0346 01/11/16 0416  WBC 9.1 9.0 8.1 13.7* 10.3  NEUTROABS  --   --   --   --  7.9*  HGB 13.3 13.6 13.6 14.5 14.2  HCT 43.1 42.4 43.0 45.3 43.9  MCV 80.1 78.5 79.5 78.4 77.8*  PLT 236 226 220 326 251   BNP (last 3 results)  Recent Labs  08/15/15 1447 11/08/15 1450 01/01/16 1603  BNP 546.6* 748.2* 568.6*   CBG:  Recent Labs Lab 01/11/16 0541 01/11/16 1131 01/11/16 1647 01/11/16 2052 01/12/16 0623  GLUCAP 195* 293* 340* 169* 223*     Signed:  Vassie Loll MD.  [facility] 01/12/2016, 11:16 AM

## 2016-01-12 NOTE — Progress Notes (Signed)
Physical Therapy Treatment Patient Details Name: Jacob [name] MRN: [id number] DOB: November 12, 1955 Today's Date: Jan 14, 2016    History of Present Illness Patient is a 60 yo male admitted 01/01/16 with dyspnea, chest pain, LE edema, increased weight.  Patient with CHF exacerbation.    PMH:  Afib, HTN, CHF, EF 25-35%, CAD, MI, CABG, pacemaker, DM, COPD, obesity    PT Comments    Patient ambulating better today. On room air with SaO2 95% throughout. Continues with slight unsteadiness and drifts to his right, however he refused use of RW. He was able to independently maintain his balance without external support.    Follow Up Recommendations  Outpatient PT (for cardiac rehab; consider Cardiac Rehab Phase2)     Equipment Recommendations  Rolling walker with 5" wheels (pt currently refusing RW)    Recommendations for Other Services       Precautions / Restrictions Precautions Precautions: Fall    Mobility  Bed Mobility                  Transfers Overall transfer level: Modified independent Equipment used: None   Sit to Stand: Modified independent (Device/Increase time)         General transfer comment: use of UEs on armrests  Ambulation/Gait Ambulation/Gait assistance: Min guard;Supervision Ambulation Distance (Feet): 200 Feet Assistive device: None Gait Pattern/deviations: Step-through pattern;Decreased stride length;Drifts right/left Gait velocity: reduced   General Gait Details: pt refused to use RW "I am better, I don't need it" Ambulated 200 ft with only slight drift to his rt x 2 with independent recovery   Stairs            Wheelchair Mobility    Modified Rankin (Stroke Patients Only)       Balance     Sitting balance-Leahy Scale: Good       Standing balance-Leahy Scale: Fair                      Cognition Arousal/Alertness: Awake/alert Behavior During Therapy: WFL for tasks assessed/performed Overall Cognitive Status: Within  Functional Limits for tasks assessed                      Exercises      General Comments        Pertinent Vitals/Pain Pain Assessment: No/denies pain    Home Living                      Prior Function            PT Goals (current goals can now be found in the care plan section) Acute Rehab PT Goals Patient Stated Goal: To walk more PT Goal Formulation: With patient Time For Goal Achievement: 01/19/16 Potential to Achieve Goals: Good Progress towards PT goals: Progressing toward goals    Frequency    Min 3X/week      PT Plan Current plan remains appropriate    Co-evaluation             End of Session Equipment Utilized During Treatment:  (pt also refused gait belt) Activity Tolerance: Patient tolerated treatment well Patient left: in chair;with call bell/phone within reach     Time: 1230-1239 PT Time Calculation (min) (ACUTE ONLY): 9 min  Charges:  $Gait Training: 8-22 mins                    G Codes:      Jacob [name] 01/14/2016, 12:45 PM  Pager [phone]

## 2016-01-12 NOTE — Progress Notes (Signed)
Patient given discharge instructions and all questions answered. Pt. Taught how to use insulin pen with the insulin pen demo.  Patient discharged with all belongings via wheelchair.

## 2016-01-12 NOTE — Progress Notes (Signed)
Patient ID: Jacob [name], male   DOB: 1955-12-01, 60 y.o.   MRN: [id number]    Advanced Heart Failure Rounding Note   Subjective:    Admitted with marked volume overload. Diuresed well (21 lbs) on IV lasix with milrinone and switched to po. Started feeling worse and developed AKI once transitioned to po diuretics and off milrinone.   PICC placed 01/11/16 to evaluate for low output. Initial Coox 64.3%. Coox 56.7 this am.   States he is feeling much better today than the past two days and would like to go home. Denies DOE. No CP, lightheadedness, or dizziness.   Creatinine 2.1 -> 1.7 -> 1.87 -> 2.08 -> 1.7 BUN           67 -> 64  ->  71   ->  91   -> 99   Objective:   Weight Range:  Vital Signs:   Temp:  [97.9 F (36.6 C)-98.2 F (36.8 C)] 98.1 F (36.7 C) (10/20 0524) Pulse Rate:  [52-102] 86 (10/20 0524) Resp:  [18] 18 (10/20 0524) BP: (97-125)/(64-70) 97/69 (10/20 0524) SpO2:  [95 %-100 %] 98 % (10/20 0524) Weight:  [209 lb 8 oz (95 kg)] 209 lb 8 oz (95 kg) (10/20 0524) Last BM Date: 01/10/16  Weight change: Filed Weights   01/10/16 0543 01/11/16 0544 01/12/16 0524  Weight: 209 lb 8 oz (95 kg) 208 lb 14.4 oz (94.8 kg) 209 lb 8 oz (95 kg)    Intake/Output:   Intake/Output Summary (Last 24 hours) at 01/12/16 0732 Last data filed at 01/12/16 0644  Gross per 24 hour  Intake             1300 ml  Output             2100 ml  Net             -800 ml     Physical Exam: General: Seated in Recliner. NAD HEENT: normal x for poor dentition Neck: supple. JVP 5-6 cm Carotid.  2+ bilat; no bruits. No thyromegaly or nodule noted.  Cor: PMI lateral. , Irregularly irregular. No M/G/R noted.  Lungs: Clear, normal effort Abdomen: soft, NT, ND, no HSM. No bruits or masses. +BS  Extremities: no cyanosis, clubbing, rash. No peripheral edema. Neuro: alert & orientedx3, cranial nerves grossly intact. moves all 4 extremities w/o difficulty. Affect pleasant   Telemetry: Reviewed  personally, Afib 90s  Labs: Basic Metabolic Panel:  Recent Labs Lab 01/08/16 0326 01/09/16 0356 01/10/16 0346 01/11/16 0416 01/12/16 0500  NA 122* 122* 123* 124* 125*  K 4.4 4.4 4.1 4.5 4.4  CL 79* 81* 80* 80* 81*  CO2 30 31 27 31  32  GLUCOSE 245* 293* 48* 212* 242*  BUN 67* 64* 71* 95* 99*  CREATININE 2.10* 1.73* 1.87* 2.08* 1.75*  CALCIUM 9.2 9.0 9.6 9.3 9.2    Liver Function Tests: No results for input(s): AST, ALT, ALKPHOS, BILITOT, PROT, ALBUMIN in the last 168 hours. No results for input(s): LIPASE, AMYLASE in the last 168 hours. No results for input(s): AMMONIA in the last 168 hours.  CBC:  Recent Labs Lab 01/06/16 0546 01/08/16 0326 01/09/16 0356 01/10/16 0346 01/11/16 0416  WBC 9.1 9.0 8.1 13.7* 10.3  NEUTROABS  --   --   --   --  7.9*  HGB 13.3 13.6 13.6 14.5 14.2  HCT 43.1 42.4 43.0 45.3 43.9  MCV 80.1 78.5 79.5 78.4 77.8*  PLT 236 226 220 326 251  Cardiac Enzymes: No results for input(s): CKTOTAL, CKMB, CKMBINDEX, TROPONINI in the last 168 hours.  BNP: BNP (last 3 results)  Recent Labs  08/15/15 1447 11/08/15 1450 01/01/16 1603  BNP 546.6* 748.2* 568.6*    ProBNP (last 3 results) No results for input(s): PROBNP in the last 8760 hours.    Other results:  Imaging: Dg Chest Port 1 View  Result Date: 01/11/2016 CLINICAL DATA:  PICC line placement. EXAM: PORTABLE CHEST 1 VIEW COMPARISON:  01/01/2016. FINDINGS: PICC line noted with tip at the cavoatrial junction. Cardiac pacer in stable position. Prior CABG. Cardiomegaly with pulmonary vascular prominence and bilateral interstitial prominence consistent congestive heart failure. Bilateral carotid vascular calcification. IMPRESSION: 1.  Right PICC line noted with tip at cavoatrial junction. 2. Cardiac pacer in stable position. Prior CABG. Congestive heart failure pulmonary interstitial edema. Electronically Signed   By: Maisie Fushomas  Register   On: 01/11/2016 13:04     Medications:      Scheduled Medications: . amiodarone  200 mg Oral Daily  . arformoterol  15 mcg Nebulization BID  . atorvastatin  40 mg Oral q1800  . budesonide (PULMICORT) nebulizer solution  0.5 mg Nebulization BID  . digoxin  0.0625 mg Oral Daily  . ferrous sulfate  325 mg Oral TID WC  . gabapentin  300 mg Oral BID  . hydrALAZINE  12.5 mg Oral QID  . Influenza vac split quadrivalent PF  0.5 mL Intramuscular Tomorrow-1000  . insulin aspart  0-20 Units Subcutaneous TID WC  . insulin aspart  0-5 Units Subcutaneous QHS  . insulin aspart  5 Units Subcutaneous TID WC  . insulin glargine  15 Units Subcutaneous QHS  . isosorbide mononitrate  30 mg Oral Daily  . nicotine  21 mg Transdermal Daily  . rivaroxaban  20 mg Oral QAC supper  . sodium chloride flush  3 mL Intravenous Q12H  . spironolactone  25 mg Oral Daily    Infusions:    PRN Medications: sodium chloride, acetaminophen, albuterol, ipratropium-albuterol, magnesium hydroxide, ondansetron (ZOFRAN) IV, sodium chloride flush, sodium chloride flush   Assessment:  1. Acute on chronic systolic HF    --echo 8/17 EF 16-10%25-30% 2. Chronic Afib 3. DM2, uncontrolled 4. H/o GI bleed 5. Tobacco abuse 6. [facility] 7. 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 Non-compliance 9. Hyponatremia   Plan/Discussion:    Admitted with marked volume overload in the setting of medication noncompliance.   Volume status stable on exam. Overall down 21 lbs.     Creatinine back down slightly Cr 1.7 > 1.87 -> 2.08 -> 1.75. Sodium 125.   PICC placed for coox. Initial coox 64%. Coox 56% this am. (Fick CO 3.32, CI 1.59)  Cardiac output depressed, but pt is not candidate for inotrope support as outpatient due to marked non-compliance.    Continue hydral/imdur. No room to up-titrate with soft pressures.   Digoxin level 0.4 on 01/06/16.  He has chronic atrial arrhythmias (fibrillation/flutter).  Continue amiodarone 200 mg daily. This was cut back due to a  tremor. On Xarelto.   Cardiac rehab following.    His primary problem is non-compliance. He has poor insight in to his disease and has significant systolic HF, likely approaching end stage.  He has failed paramedicine program with marked non-compliance as above (frequent no show to appointments, medication non-compliance, and difficult to reach).   Unfortunately we have very little to offer [name] to improve his prognosis.     STOP  spiro with non-compliance.  OK to continue digoxin for now.   He wishes to go home.  Will discontinue PICC and make very close HF follow up for next week.  He remains at high risk for readmission for reasons stated above.   HF meds for discharge  Amiodarone 200 mg daily Atorvastatin 40 mg daily Digoxin 0.0625 mg daily Hydral 12.5 mg TID Imdur 30 mg daily Torsemide 80 mg BID  Xarelto 20 mg daily  Length of Stay: 10  Graciella Freer PA-C  01/12/2016, 7:32 AM  Advanced Heart Failure Team Pager [phone] (M-F; 7a - 4p)  Please contact [facility] for night-coverage after hours (4p -7a ) and weekends on [email or web address]  Patient seen and examined with Otilio Saber, PA-C. We discussed all aspects of the encounter. I agree with the assessment and plan as stated above.   Improved today but co-ox remains marginal. Volume status looks good. This is probably about as good as we are going to get him. Long talk about need for compliance with medication therapy and HF follow-up. Ok to send home on above regimen. He is not a candidate for advanced therapies due to non-compliance.  Bensimhon, Daniel,MD 10:42 AM

## 2016-01-12 NOTE — Progress Notes (Signed)
SATURATION QUALIFICATIONS: (This note is used to comply with regulatory documentation for home oxygen)  Patient Saturations on Room Air at Rest = 96%  Patient Saturations on Room Air while Ambulating = 95%  Patient Saturations on - Liters of oxygen while Ambulating = -%  Please briefly explain why patient needs home oxygen:  Patient does NOT need home oxygen.   01/12/2016 Veda Canning, PT Pager: [phone]

## 2016-01-12 NOTE — Progress Notes (Signed)
Inpatient Diabetes Program Recommendations  AACE/ADA: New Consensus Statement on Inpatient Glycemic Control (2015)  Target Ranges:  Prepandial:   less than 140 mg/dL      Peak postprandial:   less than 180 mg/dL (1-2 hours)      Critically ill patients:  140 - 180 mg/dL   Lab Results  Component Value Date   GLUCAP 223 (H) 01/12/2016   HGBA1C 11.0 (H) 11/09/2015    Review of Glycemic Control  Diabetes history:DM2 Outpatient Diabetes medications: Glipizide 10 mg BID Current orders for Inpatient glycemic control: Novolog 0-20 units TIDAC, 0-5 units QHS; Novolog 5 units TID meal coverage, Lantus 15 units QHS  Inpatient Diabetes Program Recommendations: Insulin - Meal Coverage: Glucose increased to 300's with Novolog 5 units meal coverage. Patient required a large dose of Novolog, Please consider increasing meal coverage to at least Novolog 8 units TID.   Thanks,  Christena Deem RN, MSN, [facility] Inpatient Diabetes Coordinator Team Pager [phone] (8a-5p)

## 2016-01-15 ENCOUNTER — Other Ambulatory Visit: Payer: Self-pay | Admitting: Pharmacist

## 2016-01-15 ENCOUNTER — Inpatient Hospital Stay (HOSPITAL_COMMUNITY): Payer: Medicare Other

## 2016-01-15 ENCOUNTER — Telehealth: Payer: Self-pay

## 2016-01-15 MED ORDER — INSULIN PEN NEEDLE 31G X 5 MM MISC: 2 refills | Status: DC

## 2016-01-15 NOTE — Telephone Encounter (Signed)
Transitional Care Clinic Post-discharge Follow-Up Phone Call:  Attempt #1  Date of Discharge: 01/12/2016 Principal Discharge Diagnosis(es): acute on chronic systolic and diastolic CHF, HTN, atrial fibrillation Post-discharge Communication: call placed to # [phone] (H) and a HIPAA compliant voicemail message was left requesting a call back to # [phone] or [phone]. Call Completed: No    Call placed to [facility] # [phone] and informed her that the patient has been discharged and this CM tried to reach him to confirm his appointment tomorrow but needed to leave a voicemail message.

## 2016-01-15 NOTE — Telephone Encounter (Signed)
Transitional Care Clinic Post-discharge Follow-Up Phone Call:  Date of Discharge: 01/12/2016 Principal Discharge Diagnosis(es): acute on chronic systolic and diastolic CHF, HTN, atrial fibrillation, DM, COPD Post-discharge Communication: (Clearly document all attempts clearly and date contact made) Call placed to the patient Call Completed: Yes                    With Whom: Patient Interpreter Needed: No     Please check all that apply:  X  Patient is knowledgeable of his/her condition(s) and/or treatment. X  Patient is caring for self at home.  ? Patient is receiving assist at home from family and/or caregiver. Family and/or caregiver is knowledgeable of patient's condition(s) and/or treatment. X  Patient is receiving home health services. If so, name of agency. - He stated that the nurse, Florentina Addison, from the para-medicine program is meeting with him tomorrow at 1100.      Medication Reconciliation:  ? Medication list reviewed with patient. X  Patient obtained all discharge medications. If not, why? - He stated that he just picked up his medications today and has the insulin to take tonight. He said that he was instructed how to administer the insulin. He said that he still has not picked up his glucometer at [facility] and he plans to pick up it tomorrow. He said that he did not want to review his medication list and said  he was at work. He said that he would review the medication list with Florentina Addison, the paramedic tomorrow and he would bring his medications with him to the clinic tomorrow.    Activities of Daily Living:  X  Independent ? Needs assist (describe; ? home DME used) ? [facility] (describe, ? home DME used)   Community resources in place for patient:  ? None  X  Home Health/Home DME - home para-medicine program.  ? Assisted Living ? Support Group           Questions/Concerns discussed: Confirmed the patient's appointment for tomorrow, 01/16/16 @ 1400. He also confirmed that  he has transportation to the clinic.  He said that he is " doing good." He denied any chest pain, edema. He said that he has occasional shortness of breath, none at present time,  but is much improved from when he was in the hospital. No other problems/questions reported at this time. Hew as at work and was anxious to get off of the phone.

## 2016-01-16 ENCOUNTER — Encounter: Payer: Self-pay | Admitting: Family Medicine

## 2016-01-16 ENCOUNTER — Telehealth: Payer: Self-pay

## 2016-01-16 ENCOUNTER — Ambulatory Visit: Payer: Medicare Other | Attending: Family Medicine | Admitting: Family Medicine

## 2016-01-16 VITALS — BP 109/68 | HR 109 | Temp 98.3°F | Ht 65.0 in | Wt 220.4 lb

## 2016-01-16 DIAGNOSIS — Z590 Homelessness: Secondary | ICD-10-CM | POA: Diagnosis not present

## 2016-01-16 DIAGNOSIS — I482 Chronic atrial fibrillation: Secondary | ICD-10-CM | POA: Insufficient documentation

## 2016-01-16 DIAGNOSIS — Z9889 Other specified postprocedural states: Secondary | ICD-10-CM | POA: Insufficient documentation

## 2016-01-16 DIAGNOSIS — I4891 Unspecified atrial fibrillation: Secondary | ICD-10-CM

## 2016-01-16 DIAGNOSIS — Z794 Long term (current) use of insulin: Secondary | ICD-10-CM | POA: Diagnosis not present

## 2016-01-16 DIAGNOSIS — I110 Hypertensive heart disease with heart failure: Secondary | ICD-10-CM | POA: Diagnosis not present

## 2016-01-16 DIAGNOSIS — I739 Peripheral vascular disease, unspecified: Secondary | ICD-10-CM | POA: Diagnosis not present

## 2016-01-16 DIAGNOSIS — H8113 Benign paroxysmal vertigo, bilateral: Secondary | ICD-10-CM | POA: Diagnosis not present

## 2016-01-16 DIAGNOSIS — E1151 Type 2 diabetes mellitus with diabetic peripheral angiopathy without gangrene: Secondary | ICD-10-CM | POA: Diagnosis not present

## 2016-01-16 DIAGNOSIS — I5042 Chronic combined systolic (congestive) and diastolic (congestive) heart failure: Secondary | ICD-10-CM | POA: Diagnosis not present

## 2016-01-16 DIAGNOSIS — I5023 Acute on chronic systolic (congestive) heart failure: Secondary | ICD-10-CM | POA: Diagnosis not present

## 2016-01-16 DIAGNOSIS — I2510 Atherosclerotic heart disease of native coronary artery without angina pectoris: Secondary | ICD-10-CM | POA: Diagnosis not present

## 2016-01-16 DIAGNOSIS — Z9119 Patient's noncompliance with other medical treatment and regimen: Secondary | ICD-10-CM | POA: Diagnosis not present

## 2016-01-16 DIAGNOSIS — Z7901 Long term (current) use of anticoagulants: Secondary | ICD-10-CM | POA: Insufficient documentation

## 2016-01-16 DIAGNOSIS — H8110 Benign paroxysmal vertigo, unspecified ear: Secondary | ICD-10-CM | POA: Insufficient documentation

## 2016-01-16 DIAGNOSIS — Z951 Presence of aortocoronary bypass graft: Secondary | ICD-10-CM | POA: Diagnosis not present

## 2016-01-16 DIAGNOSIS — Z95810 Presence of automatic (implantable) cardiac defibrillator: Secondary | ICD-10-CM | POA: Diagnosis not present

## 2016-01-16 DIAGNOSIS — Z91199 Patient's noncompliance with other medical treatment and regimen due to unspecified reason: Secondary | ICD-10-CM

## 2016-01-16 DIAGNOSIS — E7800 Pure hypercholesterolemia, unspecified: Secondary | ICD-10-CM | POA: Insufficient documentation

## 2016-01-16 DIAGNOSIS — Z955 Presence of coronary angioplasty implant and graft: Secondary | ICD-10-CM | POA: Insufficient documentation

## 2016-01-16 DIAGNOSIS — I252 Old myocardial infarction: Secondary | ICD-10-CM | POA: Diagnosis not present

## 2016-01-16 DIAGNOSIS — E119 Type 2 diabetes mellitus without complications: Secondary | ICD-10-CM | POA: Insufficient documentation

## 2016-01-16 DIAGNOSIS — R410 Disorientation, unspecified: Secondary | ICD-10-CM

## 2016-01-16 DIAGNOSIS — F05 Delirium due to known physiological condition: Secondary | ICD-10-CM | POA: Diagnosis not present

## 2016-01-16 LAB — COMPLETE METABOLIC PANEL WITH GFR
ALK PHOS: 153 U/L — AB (ref 40–115)
ALT: 40 U/L (ref 9–46)
AST: 50 U/L — ABNORMAL HIGH (ref 10–35)
Albumin: 3.9 g/dL (ref 3.6–5.1)
BILIRUBIN TOTAL: 1 mg/dL (ref 0.2–1.2)
BUN: 21 mg/dL (ref 7–25)
CALCIUM: 9.3 mg/dL (ref 8.6–10.3)
CO2: 28 mmol/L (ref 20–31)
CREATININE: 1.02 mg/dL (ref 0.70–1.33)
Chloride: 95 mmol/L — ABNORMAL LOW (ref 98–110)
GFR, Est Non African American: 81 mL/min (ref >=60)
Glucose, Bld: 212 mg/dL — ABNORMAL HIGH (ref 65–99)
Potassium: 4.7 mmol/L (ref 3.5–5.3)
Sodium: 130 mmol/L — ABNORMAL LOW (ref 135–146)
TOTAL PROTEIN: 7.5 g/dL (ref 6.1–8.1)

## 2016-01-16 LAB — GLUCOSE, POCT (MANUAL RESULT ENTRY): POC Glucose: 261 mg/dl — AB (ref 70–99)

## 2016-01-16 MED ORDER — MECLIZINE HCL 25 MG PO TABS: 25.0000 mg | ORAL_TABLET | Freq: Two times a day (BID) | ORAL | 1 refills | Status: DC | PRN

## 2016-01-16 MED ORDER — FUROSEMIDE 10 MG/ML IJ SOLN
40.0000 mg | Freq: Once | INTRAMUSCULAR | Status: AC
  Administered 2016-01-16: 40 mg via INTRAMUSCULAR

## 2016-01-16 MED FILL — !LANTUS SOLOSTAR 100UNITS/M: 100 | 25 days supply | Qty: 3 | Fill #1

## 2016-01-16 MED FILL — FERROUS SULFATE 325 MG TAB: 325 (65 FE) | 30 days supply | Qty: 90 | Fill #1

## 2016-01-16 MED FILL — XARELTO 20 MG TABLET: 20 | 30 days supply | Qty: 30 | Fill #1

## 2016-01-16 MED FILL — VENTOLIN HFA 90 MCG INHALER: 108 (90 BAS | 28 days supply | Qty: 18 | Fill #1

## 2016-01-16 MED FILL — TORSEMIDE 20 MG TABLET: 20 | 30 days supply | Qty: 240 | Fill #1

## 2016-01-16 MED FILL — SPIRONOLACTONE 25 MG TABLET: 25 | 30 days supply | Qty: 30 | Fill #1

## 2016-01-16 MED FILL — hydrALAZINE HCL 25 MG TABS: 25 | 60 days supply | Qty: 90 | Fill #1

## 2016-01-16 MED FILL — glipiZIDE 10 MG TABS: 10 | 30 days supply | Qty: 60 | Fill #2

## 2016-01-16 MED FILL — AMIODARONE HCL 200 MG TAB: 200 | 30 days supply | Qty: 30 | Fill #1

## 2016-01-16 MED FILL — ?ISOSORBIDE MN ER 30 MG TAB: 30 | 30 days supply | Qty: 30 | Fill #1

## 2016-01-16 MED FILL — GABAPENTIN 300 MG CAPSULE: 300 | 30 days supply | Qty: 60 | Fill #0

## 2016-01-16 MED FILL — TRAVEL SICKNESS 25 MG TAB C: 25 | 30 days supply | Qty: 60 | Fill #0

## 2016-01-16 MED FILL — ATORVASTATIN 40 MG TABLET: 40 | 30 days supply | Qty: 30 | Fill #1

## 2016-01-16 NOTE — Progress Notes (Signed)
Transitional care clinic  Date of telephone encounter: 01/10/16  Hospitalization dates: 01/01/16 through 01/12/16  Subjective:  Patient ID: Jacob [name], male    DOB: 03-14-1956  Age: 60 y.o. 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 He had endorsed running out of his medications which is typical for him.  He received aggressive diuresis with IV Lasix which was transitioned to torsemide resulting weight loss and was closely followed by cardiology; beta blocker and ACE inhibitor on hold due to soft blood pressure. Lantus was added to his regimen for better glycemic control due to an A1c of 11.0   He never got to pickup his discharge medications until yesterday but then informs me he misplaced them between last night and this morning and is yet to take a single dose of his medications. He does not seem to remember if he had any cause to leave his house between last night and this morning. He is short of breath, complains of fatigue and some confusion. He is unable to tell me if he had breakfast or not. Also states he did not sleep last night but then is unable to tell me what he did last night. He is accompanied by the caseworker from [facility].  Past Medical History:  Diagnosis Date  . Atrial fibrillation (HCC)    RVR 10/2014  . Automatic implantable cardioverter-defibrillator in situ   . CAD (coronary artery disease) Sept 2013   s/p cardiac cath showing occlusion of small RCA with collaterals  . CHF (congestive heart failure) (HCC)    20 to 25 % EF  and RV dysfunction by 07/2014 echo   . Chronic anticoagulation    on xarelto.   . High cholesterol   . Hypertension   . Myocardial infarction 2014  . Noncompliance    homelessness contributing.   . Peripheral arterial disease (HCC)   . Type II diabetes mellitus (HCC)     Past Surgical History:  Procedure Laterality Date  . CARDIAC CATHETERIZATION  09/2012  . CORONARY ANGIOPLASTY WITH STENT PLACEMENT  11/2011   "1"  . CORONARY ARTERY BYPASS GRAFT  09/2012   2 vessels per patient [name]   . ESOPHAGOGASTRODUODENOSCOPY N/A 11/28/2014   Procedure: ESOPHAGOGASTRODUODENOSCOPY (EGD);  Surgeon: Beverley Fiedler, MD;  Location: [facility] ENDOSCOPY;  Service: Endoscopy;  Laterality: N/A;  . ILIAC ARTERY STENT Right 08/30/2013  . IMPLANTABLE CARDIOVERTER DEFIBRILLATOR IMPLANT     [place] in 07/2012; [employer]  . LEFT AND RIGHT HEART CATHETERIZATION WITH CORONARY ANGIOGRAM N/A 12/02/2011   Procedure: LEFT AND RIGHT HEART CATHETERIZATION WITH CORONARY ANGIOGRAM;  Surgeon: Kathleene Hazel, MD;  Location: [facility] CATH LAB;  Service: Cardiovascular;  Laterality: N/A;  . LOWER EXTREMITY ANGIOGRAM N/A 08/30/2013   Procedure: LOWER EXTREMITY ANGIOGRAM;  Surgeon: Runell Gess, MD;  Location: [facility] CATH LAB;  Service: Cardiovascular;  Laterality: N/A;  . LOWER EXTREMITY ANGIOGRAM N/A 12/02/2013   Procedure: LOWER EXTREMITY ANGIOGRAM;  Surgeon: Runell Gess, MD;  Location: [facility] CATH LAB;  Service: Cardiovascular;  Laterality: N/A;     Outpatient Medications Prior to Visit  Medication Sig Dispense Refill  .  ACCU-CHEK SOFTCLIX LANCETS lancets Use for once daily testing of blood sugar (Patient not taking: Reported on 01/16/2016) 100 each 12  . acetaminophen-codeine (TYLENOL #3) 300-30 MG tablet Take 1 tablet by mouth every 12 (twelve) hours as needed for moderate pain. (Patient not taking: Reported on 01/16/2016) 60 tablet 1  . albuterol (PROVENTIL HFA;VENTOLIN HFA) 108 (90 Base) MCG/ACT inhaler Inhale 2 puffs into  the lungs every 6 (six) hours as needed for wheezing or shortness of breath. 1 Inhaler 2  . albuterol (PROVENTIL) (2.5 MG/3ML) 0.083% nebulizer solution Take 3 mLs (2.5 mg total) by nebulization every 6 (six) hours as needed for wheezing or shortness of breath. 150 mL 1  . amiodarone (PACERONE) 200 MG tablet Take 1 tablet (200 mg total) by mouth daily. 30 tablet 1  . atorvastatin (LIPITOR) 40 MG tablet Take 1 tablet (40 mg total) by mouth daily at 6 PM. 30 tablet 2  . Blood Glucose Monitoring Suppl (ACCU-CHEK AVIVA) device Use as instructed1 times daily before meals 1 each 0  . digoxin (LANOXIN) 0.125 MG tablet Take 0.5 tablets (0.0625 mg total) by mouth daily. (Patient not taking: Reported on 01/16/2016) 15 tablet 5  . ferrous sulfate 325 (65 FE) MG EC tablet Take 325 mg by mouth 3 (three) times daily with meals.     . Fluticasone-Salmeterol (ADVAIR) 100-50 MCG/DOSE AEPB Inhale 1 puff into the lungs daily.    Marland Kitchen gabapentin (NEURONTIN) 300 MG capsule Take 1 capsule (300 mg total) by mouth 2 (two) times daily. 60 capsule 5  . glipiZIDE (GLUCOTROL) 10 MG tablet Take 1 tablet (10 mg total) by mouth 2 (two) times daily before a meal. 60 tablet 2  . glucose blood (ACCU-CHEK AVIVA) test strip Use as instructed for 1 times daily testing of blood sugar 100 each 12  . hydrALAZINE (APRESOLINE) 25 MG tablet Take 0.5 tablets (12.5 mg total) by mouth 3 (three) times daily. 90 tablet 1  . Insulin Glargine (LANTUS) 100 UNIT/ML Solostar Pen Inject 12 Units into the skin daily at 10 pm. 15 mL 11  . Insulin Pen Needle 31G X 5 MM MISC Use as directed for once daily insulin injection 100 each 2  . isosorbide mononitrate (IMDUR) 30 MG 24 hr tablet Take 1 tablet (30 mg total) by mouth daily. 30 tablet 1  . Lancet Devices (ACCU-CHEK SOFTCLIX) lancets Use as instructed for once daily testing of blood sugar (Patient not taking: Reported on 01/16/2016) 1 each 0  . nicotine (NICODERM CQ - DOSED IN MG/24 HOURS) 21 mg/24hr patch  Place 1 patch (21 mg total) onto the skin daily. 28 patch 0  . rivaroxaban (XARELTO) 20 MG TABS tablet Take 1 tablet (20 mg total) by mouth daily before supper. 30 tablet 5  . torsemide (DEMADEX) 20 MG tablet Take 4 tablets (80 mg total) by mouth 2 (two) times daily. 240 tablet 3   No facility-administered medications prior to visit.     ROS Review of Systems  Constitutional: Positive for fatigue. Negative for activity change and appetite change.  HENT: Negative for sinus pressure and sore throat.   Eyes: Negative for visual disturbance.  Respiratory: Positive for shortness of breath. Negative for cough and chest tightness.   Cardiovascular: Negative for chest pain and leg swelling.  Gastrointestinal: Negative for abdominal distention, abdominal pain, constipation and diarrhea.  Endocrine: Negative.   Genitourinary: Negative for dysuria.  Musculoskeletal: Negative for joint swelling and myalgias.  Skin: Negative for rash.  Allergic/Immunologic: Negative.  Neurological: Negative for weakness, light-headedness and numbness.  Psychiatric/Behavioral: Positive for confusion. Negative for dysphoric mood and suicidal ideas.    Objective:  BP 109/68 (BP Location: Right Arm, Patient Position: Sitting, Cuff Size: Large)   Pulse (!) 109   Temp 98.3 F (36.8 C) (Oral)   Ht 5\' 5"  (1.651 m)   Wt 220 lb 6.4 oz (100 kg)   SpO2 98%   BMI 36.68 kg/m   BP/Weight 01/16/2016 01/12/2016 11/28/2015  Systolic BP 109 97 112  Diastolic BP 68 69 68  Wt. (Lbs) 220.4 209.5 221.8  BMI 36.68 34.86 36.91   Wt Readings from Last 3 Encounters:  01/16/16 220 lb 6.4 oz (100 kg)  01/12/16 209 lb 8 oz (95 kg)  11/28/15 221 lb 12.8 oz (100.6 kg)      Physical Exam Constitutional: He is oriented to person, place, and time. He appears well-developed and well-nourished.  Neck: Normal range of motion. + JVD. Cardiovascular: Tachycardic rate, regular rhythm and normal heart sounds.   No murmur  heard. Pulmonary/Chest: Effort normal and breath sounds normal. No respiratory distress. He has no wheezes. He exhibits no tenderness.  Abdominal: Soft. Bowel sounds are normal. He exhibits no mass. There is no tenderness.  Musculoskeletal: Normal range of motion. He exhibits no edema or tenderness.  Neurological: He is alert and oriented to person, place, and time but he appears somewhat confused and does not provide appropriate response to some questions.  Skin: Skin is warm and dry.  Psychiatric: He has a normal mood and affect.   CMP Latest Ref Rng & Units 01/12/2016 01/11/2016 01/10/2016  Glucose 65 - 99 mg/dL 981(X) 914(N) 82(N)  BUN 6 - 20 mg/dL 56(O) 13(Y) 86(V)  Creatinine 0.61 - 1.24 mg/dL 7.84(O) 9.62(X) 5.28(U)  Sodium 135 - 145 mmol/L 125(L) 124(L) 123(L)  Potassium 3.5 - 5.1 mmol/L 4.4 4.5 4.1  Chloride 101 - 111 mmol/L 81(L) 80(L) 80(L)  CO2 22 - 32 mmol/L 32 31 27  Calcium 8.9 - 10.3 mg/dL 9.2 9.3 9.6  Total Protein 6.5 - 8.1 g/dL - - -  Total Bilirubin 0.3 - 1.2 mg/dL - - -  Alkaline Phos 38 - 126 U/L - - -  AST 15 - 41 U/L - - -  ALT 17 - 63 U/L - - -     Assessment & Plan:   1. DM (diabetes mellitus), type 2 with peripheral vascular complications (HCC) Uncontrolled with A1c of 11.0 largely due to noncompliance. I have spoken with the pharmacy who will refill all his medications and charge it to his account despite the fact that he picked them up yesterday and lost all of them. Strongly encouraged to pickup his glucometer and testing supplies from [facility] and encouraged to check his sugars whenever he feels dizzy. We'll review blood sugar log at next visit. - Glucose (CBG)  2. Benign paroxysmal positional vertigo due to bilateral vestibular disorder Educated on sedating side effect of meclizine and heis taking this at bedtime - meclizine (ANTIVERT) 25 MG tablet; Take 1 tablet (25 mg total) by mouth 2 (two) times daily as needed.  Dispense: 60 tablet; Refill: 1  3.  Subacute confusional state Will need to check for hyponatremia especially since he is in fluid overload at this time as this could contribute to his confusion. ED precautions have been reviewed with the patient - COMPLETE METABOLIC PANEL WITH GFR - AMMONIA  4. Acute on chronic systolic CHF (congestive heart failure), NYHA class 3 (HCC)  EF of 25-30% from 2-D echo of 10/2015 Currently in fluid overload with weight gain of 11 pounds in the last 4 days due to not taking his Lasix. Lasix 40 mg given IM in the clinic Keep appointment with cardiology - Brain natriuretic peptide - furosemide (LASIX) injection 40 mg; Inject 4 mLs (40 mg total) into the muscle once.  5. Atrial fibrillation with controlled ventricular response (HCC) Currently on anticoagulation with Xarelto and rate control with amiodarone He has not been taking digoxin  6. Noncompliance Effects of noncompliance have been discussed with the patient but he has poor insight regarding his condition Currently has a case manager from [facility] following up on his care   Meds ordered this encounter  Medications  . meclizine (ANTIVERT) 25 MG tablet    Sig: Take 1 tablet (25 mg total) by mouth 2 (two) times daily as needed.    Dispense:  60 tablet    Refill:  1  . furosemide (LASIX) injection 40 mg    Seen by the case manager and licensed clinical social worker today and we have worked together with the pharmacy to ensure he left the clinic at the end of the visit with all his medications. Follow-up: Return in about 2 weeks (around 01/30/2016) for Follow-up on diabetes mellitus.   Jaclyn ShaggyEnobong Amao MD

## 2016-01-16 NOTE — Telephone Encounter (Addendum)
Met with the patient when he was at the clinic today to see Dr Jacob [name]. Jacob [name], [facility] clinical caseworker also met him at the appointment. He stated that he picked up his medications at [facility] yesterday and took them home and he now can't find any of them , including the insulin. He also said that he didn't take the insulin last night. He said that the medications were not left in his car and he is positive that he took them into his house. This CM suggested that he check the car again, and he was adamant that they were not there. He said that he has no idea where the medications went to. Jacob [name] stated that he has a one room apartment with only a mattress in the room. She has visited with him at home.  He also stated that he fell this morning and just reported the fall to Dr Jacob [name].   He said that he called Jacob [name] with para-medicine program yesterday and asked her to come back again to help him with his medications. She was supposed to see him today but she rescheduled the appointment to tomorrow, 01/17/16 and he will meet her at the cardiology appointment.   Calls placed to Jacob [name] , paramedicine program # [phone] and # [phone] and messages were left at both numbers requesting a call back.  Jacob [name] returned the call and this CM informed her of the status of the medications and that the patient is not able to find them. He has no idea where they are and he had not taken any medications since discharge and his weight is up 11 lbs since discharge.. Informed Jacob [name] that he should have the medications by tomorrow. Jacob [name] stated that she would set up his medications  for him going forward and would discuss ordering pre-packaged medications for him.    As per Jacob [name], [facility] Pharmacy Tech, the patient's medications will be ready for pick up at the end of the day today, around 1700 or he can pick them up in the morning. Informed the patient of the status of his medication refills and he said that  he would pick them up in the morning prior to his cardiology appointment.   He also stated that he is agreeable to home health services if ordered by Dr Jacob [name] and he has no preference of a home health agency. Message sent to Dr Jacob [name].   Jacob [name] stated that she will be seeing the patient at home on 01/22/16 @ 1400.

## 2016-01-16 NOTE — Telephone Encounter (Signed)
While meeting with the patient today he stated that he received food stamps but does not use them noting that he feels someone else less fortunate than he is can use them.

## 2016-01-16 NOTE — Progress Notes (Signed)
"  Lost all his meds this morning" Caregiver states that he is very confused

## 2016-01-16 NOTE — Patient Instructions (Signed)

## 2016-01-17 ENCOUNTER — Telehealth: Payer: Self-pay

## 2016-01-17 ENCOUNTER — Ambulatory Visit (HOSPITAL_COMMUNITY)
Admission: RE | Admit: 2016-01-17 | Discharge: 2016-01-17 | Disposition: A | Discharge status: Home / Self Care | Payer: Medicare Other | Source: Ambulatory Visit | Attending: Cardiology | Admitting: Cardiology

## 2016-01-17 VITALS — BP 92/76 | HR 106 | Wt 221.6 lb

## 2016-01-17 DIAGNOSIS — E785 Hyperlipidemia, unspecified: Secondary | ICD-10-CM | POA: Insufficient documentation

## 2016-01-17 DIAGNOSIS — I5043 Acute on chronic combined systolic (congestive) and diastolic (congestive) heart failure: Secondary | ICD-10-CM | POA: Diagnosis not present

## 2016-01-17 DIAGNOSIS — F17200 Nicotine dependence, unspecified, uncomplicated: Secondary | ICD-10-CM

## 2016-01-17 DIAGNOSIS — Z9114 Patient's other noncompliance with medication regimen: Secondary | ICD-10-CM | POA: Insufficient documentation

## 2016-01-17 DIAGNOSIS — I482 Chronic atrial fibrillation: Secondary | ICD-10-CM | POA: Diagnosis not present

## 2016-01-17 DIAGNOSIS — I2510 Atherosclerotic heart disease of native coronary artery without angina pectoris: Secondary | ICD-10-CM | POA: Diagnosis not present

## 2016-01-17 DIAGNOSIS — E1151 Type 2 diabetes mellitus with diabetic peripheral angiopathy without gangrene: Secondary | ICD-10-CM | POA: Diagnosis not present

## 2016-01-17 DIAGNOSIS — Z7901 Long term (current) use of anticoagulants: Secondary | ICD-10-CM | POA: Diagnosis not present

## 2016-01-17 DIAGNOSIS — F17210 Nicotine dependence, cigarettes, uncomplicated: Secondary | ICD-10-CM | POA: Insufficient documentation

## 2016-01-17 DIAGNOSIS — I5023 Acute on chronic systolic (congestive) heart failure: Secondary | ICD-10-CM | POA: Insufficient documentation

## 2016-01-17 DIAGNOSIS — Z79899 Other long term (current) drug therapy: Secondary | ICD-10-CM | POA: Diagnosis not present

## 2016-01-17 DIAGNOSIS — Z794 Long term (current) use of insulin: Secondary | ICD-10-CM | POA: Diagnosis not present

## 2016-01-17 DIAGNOSIS — I4891 Unspecified atrial fibrillation: Secondary | ICD-10-CM

## 2016-01-17 DIAGNOSIS — I110 Hypertensive heart disease with heart failure: Secondary | ICD-10-CM | POA: Insufficient documentation

## 2016-01-17 DIAGNOSIS — I255 Ischemic cardiomyopathy: Secondary | ICD-10-CM | POA: Insufficient documentation

## 2016-01-17 DIAGNOSIS — Z951 Presence of aortocoronary bypass graft: Secondary | ICD-10-CM | POA: Insufficient documentation

## 2016-01-17 LAB — BASIC METABOLIC PANEL
ANION GAP: 8 (ref 5–15)
BUN: 22 mg/dL — ABNORMAL HIGH (ref 6–20)
CALCIUM: 9.1 mg/dL (ref 8.9–10.3)
CO2: 27 mmol/L (ref 22–32)
Chloride: 96 mmol/L — ABNORMAL LOW (ref 101–111)
Creatinine, Ser: 1.24 mg/dL (ref 0.61–1.24)
GLUCOSE: 288 mg/dL — AB (ref 65–99)
Potassium: 4.4 mmol/L (ref 3.5–5.1)
Sodium: 131 mmol/L — ABNORMAL LOW (ref 135–145)

## 2016-01-17 LAB — CBC
HCT: 40.5 % (ref 39.0–52.0)
Hemoglobin: 12.4 g/dL — ABNORMAL LOW (ref 13.0–17.0)
MCH: 25.1 pg — AB (ref 26.0–34.0)
MCHC: 30.6 g/dL (ref 30.0–36.0)
MCV: 82 fL (ref 78.0–100.0)
PLATELETS: 215 10*3/uL (ref 150–400)
RBC: 4.94 MIL/uL (ref 4.22–5.81)
RDW: 18.2 % — ABNORMAL HIGH (ref 11.5–15.5)
WBC: 9 10*3/uL (ref 4.0–10.5)

## 2016-01-17 LAB — BRAIN NATRIURETIC PEPTIDE: B Natriuretic Peptide: 774.7 pg/mL — ABNORMAL HIGH (ref 0.0–100.0)

## 2016-01-17 LAB — AMMONIA: Ammonia: 65 umol/L — ABNORMAL HIGH (ref <=47)

## 2016-01-17 MED ORDER — FUROSEMIDE 10 MG/ML IJ SOLN
80.0000 mg | Freq: Once | INTRAMUSCULAR | Status: AC
  Administered 2016-01-17: 80 mg via INTRAVENOUS
  Filled 2016-01-17: qty 8

## 2016-01-17 MED ORDER — DIGOXIN 125 MCG PO TABS: 0.0625 mg | ORAL_TABLET | Freq: Every day | ORAL | 5 refills | Status: DC

## 2016-01-17 MED ORDER — METOLAZONE 5 MG PO TABS
5.0000 mg | ORAL_TABLET | Freq: Once | ORAL | Status: AC
  Administered 2016-01-17: 5 mg via ORAL
  Filled 2016-01-17: qty 1

## 2016-01-17 MED ORDER — POTASSIUM CHLORIDE CRYS ER 20 MEQ PO TBCR
40.0000 meq | EXTENDED_RELEASE_TABLET | Freq: Once | ORAL | Status: AC
  Administered 2016-01-17: 40 meq via ORAL
  Filled 2016-01-17: qty 2

## 2016-01-17 MED FILL — ULTICARE PEN NDL 4MM 32G: 32G X 4 MM | 25 days supply | Qty: 100 | Fill #0

## 2016-01-17 MED FILL — DIGITEK 125 MCG TABLET: 125 | 30 days supply | Qty: 15 | Fill #1

## 2016-01-17 NOTE — Telephone Encounter (Signed)
This Case Manager received call from [place], Pharmacist with [facility], who indicated patient has indicated he needs insulin pen needles but was told by [facility] pharmacy at that insulin pen needles would not be available until Friday. She confirmed patient picked up his medications from pharmacy this morning.   This Case Manager spoke with Linna Hoff, Pharmacy Tech, at [facility], who indicated patient able to pick up insulin pen needles today. She also said patient's script for digoxin ready for pick up as well. This Case Manager placed return call to [facility], Pharmacist at [facility], who verbalized understanding and indicated she would update patient.

## 2016-01-17 NOTE — Progress Notes (Signed)
Patient ID: Jacob [name], male   DOB: 05-11-1955, 60 y.o.   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   Admitted the end of August 2016  with increased dyspnea on exertion. Later found to be in cardiogenic shock . At one point on dual pressors milrinone and norepi. Diuresed with IV lasix and transitioned to po lasix. Hospital course complicated by GI bleed and A fib RVR. Loaded on amio but later placed on toprol xl for rate control. On 9/5 had EGD with duodenal bleed with clip applied. He was discharged 9/12 with D/C weight 203 pounds.   Admitted late December 2016 with increased dyspnea. Diuresed with IV lasix and transitioned to po lasix 80 mg twice a day. Chronic A fib and continued on amio 200 mg twice a day. Discharge weight was 205 pounds.   Admitted 10/9 through 01/12/16 with marked volume overload. Diuresed with IV lasix and milrinone. D/C weight 209 pounds. He was provided medications at discharge.   Today he returns for post hospital follow up. He says he lost all medications at discharge. Yesterday he saw PCP and received IM lasix and was given some of his medications. He  has not had any HF meds since discharge. No bleeding problems. He has not been weighing at home. SOB with exertion. Denies PND. + Orthopnea. Followed by paramedicine but hasn't been seen since discharge. Has difficulty paying for gas.   SH: Lives in low income housing. Estranged from family  Labs 05/17/2015: K 3.9 Creatinine 1.23  Labs 08/15/2015: K 4.1 Creatinine 1.41 Labs 01/12/2016: K 4.4 Creatinine 1.75     ROS: All systems negative except as listed in HPI, PMH and Problem List.  SH:  Social  History   Social History  . Marital status: Divorced    Spouse name: N/A  . Number of children: 3  . Years of education: N/A   Occupational History  . disabled    Social History Main Topics  . Smoking status: Current Every Day Smoker    Packs/day: 0.50    Years: 40.00    Types: Cigarettes  . Smokeless tobacco: Never Used  . Alcohol use No  . Drug use: No  . Sexual activity: No   Other Topics Concern  . Not on file   Social History Narrative   Has an apartment with a roommate. He was living on the streets in January 05, 2013.  He reports that his father died in [place] in 05-Jan-2013.  He is divorced.  He is no longer estranged from his son, but still from his daughter who lives locally.  Neither of his parents, nor any siblings have any history of CAD.    FH:  Family History  Problem Relation Age of Onset  . Diabetes Mother     Past Medical History:  Diagnosis Date  . Atrial fibrillation ([place])    RVR 10/2014  . Automatic implantable cardioverter-defibrillator in situ   . CAD (coronary artery disease) Sept 2013   s/p cardiac cath showing occlusion of small RCA with collaterals  . CHF (congestive heart failure) ([place])    20 to  25 % EF and RV dysfunction by 07/2014 echo   . Chronic anticoagulation    on xarelto.   . High cholesterol   . Hypertension   . Myocardial infarction 2014  . Noncompliance    homelessness contributing.   . Peripheral arterial disease ([place])   . Type II diabetes mellitus ([place])     Current Outpatient Prescriptions  Medication Sig Dispense Refill  . Insulin Glargine (LANTUS) 100 UNIT/ML Solostar Pen Inject 12 Units into the skin daily at 10 pm. 15 mL 11  . rivaroxaban (XARELTO) 20 MG TABS tablet Take 1 tablet (20 mg total) by mouth daily before supper. 30 tablet 5  . ACCU-CHEK SOFTCLIX LANCETS lancets Use for once daily testing of blood sugar (Patient not taking: Reported on 01/16/2016) 100 each 12  . acetaminophen-codeine (TYLENOL #3) 300-30 MG  tablet Take 1 tablet by mouth every 12 (twelve) hours as needed for moderate pain. (Patient not taking: Reported on 01/16/2016) 60 tablet 1  . albuterol (PROVENTIL HFA;VENTOLIN HFA) 108 (90 Base) MCG/ACT inhaler Inhale 2 puffs into the lungs every 6 (six) hours as needed for wheezing or shortness of breath. (Patient not taking: Reported on 01/17/2016) 1 Inhaler 2  . albuterol (PROVENTIL) (2.5 MG/3ML) 0.083% nebulizer solution Take 3 mLs (2.5 mg total) by nebulization every 6 (six) hours as needed for wheezing or shortness of breath. 150 mL 1  . amiodarone (PACERONE) 200 MG tablet Take 1 tablet (200 mg total) by mouth daily. (Patient not taking: Reported on 01/17/2016) 30 tablet 1  . atorvastatin (LIPITOR) 40 MG tablet Take 1 tablet (40 mg total) by mouth daily at 6 PM. 30 tablet 2  . Blood Glucose Monitoring Suppl (ACCU-CHEK AVIVA) device Use as instructed1 times daily before meals 1 each 0  . digoxin (LANOXIN) 0.125 MG tablet Take 0.5 tablets (0.0625 mg total) by mouth daily. (Patient not taking: Reported on 01/16/2016) 15 tablet 5  . ferrous sulfate 325 (65 FE) MG EC tablet Take 325 mg by mouth 3 (three) times daily with meals.     . Fluticasone-Salmeterol (ADVAIR) 100-50 MCG/DOSE AEPB Inhale 1 puff into the lungs daily.    Marland Kitchen gabapentin (NEURONTIN) 300 MG capsule Take 1 capsule (300 mg total) by mouth 2 (two) times daily. 60 capsule 5  . glipiZIDE (GLUCOTROL) 10 MG tablet Take 1 tablet (10 mg total) by mouth 2 (two) times daily before a meal. 60 tablet 2  . glucose blood (ACCU-CHEK AVIVA) test strip Use as instructed for 1 times daily testing of blood sugar 100 each 12  . hydrALAZINE (APRESOLINE) 25 MG tablet Take 0.5 tablets (12.5 mg total) by mouth 3 (three) times daily. (Patient not taking: Reported on 01/17/2016) 90 tablet 1  . Insulin Pen Needle 31G X 5 MM MISC Use as directed for once daily insulin injection 100 each 2  . isosorbide mononitrate (IMDUR) 30 MG 24 hr tablet Take 1 tablet (30 mg  total) by mouth daily. (Patient not taking: Reported on 01/17/2016) 30 tablet 1  . Lancet Devices (ACCU-CHEK SOFTCLIX) lancets Use as instructed for once daily testing of blood sugar (Patient not taking: Reported on 01/16/2016) 1 each 0  . meclizine (ANTIVERT) 25 MG tablet Take 1 tablet (25 mg total) by mouth 2 (two) times daily as needed. 60 tablet 1  . nicotine (NICODERM CQ - DOSED IN MG/24 HOURS) 21 mg/24hr patch Place 1 patch (21 mg total) onto the skin daily. 28 patch 0  . torsemide (DEMADEX) 20 MG tablet Take  4 tablets (80 mg total) by mouth 2 (two) times daily. (Patient not taking: Reported on 01/17/2016) 240 tablet 3   No current facility-administered medications for this encounter.     Vitals:   01/17/16 1023  BP: 92/76  Pulse: (!) 106  SpO2: 94%  Weight: 221 lb 9.6 oz (100.5 kg)   Wt Readings from Last 3 Encounters:  01/17/16 221 lb 9.6 oz (100.5 kg)  01/16/16 220 lb 6.4 oz (100 kg)  01/12/16 209 lb 8 oz (95 kg)      PHYSICAL EXAM: General:  Chronically ill appearing. No resp difficulty. Arrived in a wheelchair HEENT: normal Neck: supple. JVP to jaw. Carotids 2+ bilaterally; no bruits. No thyromegaly or nodule noted. Cor: PMI normal. Irregular rate & rhythm, slightly tachy. No rubs, gallops or murmurs. Lungs: RML RLL LLL crackles Abdomen: soft, NT, ND, no HSM. No bruits or masses. +BS  Extremities: no cyanosis, clubbing, rash, R and LLE 1+ edema.  Neuro: alert & orientedx3, cranial nerves grossly intact. Moves all 4 extremities w/o difficulty. Affect pleasant.   ASSESSMENT & PLAN: 1.Acute/Chronic Systolic Heart Failure - ICM EF 20-25%. Has [employer] ICD.  NYHA IIIb symptoms. Volume overload in the setting of medication noncompliance. Today he was give 80 mg IV lasix + 40 meq of potassium  Today he was instructed to restart HF meds.  -He will continue  torsemide to 80 mg BID.   - Not on spiro due to compliance.  -Continue current dose hydralazine/imdur and  digoxin.   - No BB with recent cardiogenic shock.  - No ace for now with soft SBP.  - He is not a candidate for advanced therapies given social situation and non compliance.  2. Chronic A fib -   Rate controlled. Continue Amio 200 mg twice a day,  Xarelto. No bleeding problems.   Needs yearly eye exams.  3. DMII- Per PCP  4. H/O GI Bleed - duodenal ulcer with clip 9/5 5. PAD- Last ABI 2015 . R EIA stent with known bilateral SFA occlusion.  Repeat ABI ok. Dr Gwenlyn Found recommends yearly ABI.   5. Social  Issues-Currently resides in low income housing. Having difficulty paying for housing again. HF SW following.  6. Smoking- Current smoker. Counseled on smoking cessation.   HFSW and HF pharmacist met with him during the visit. Lengthy discussion regarding medications compliance. Follow up Friday then weekly x4 weeks. Continue Paramedicine. BMET BNP CBC today.     Brecklyn Galvis NP-C   10:30 AM

## 2016-01-17 NOTE — Progress Notes (Signed)
Advanced [facility] Failure Medication Review by a Pharmacist  Does the patient  feel that his/her medications are working for him/her?  yes  Has the patient been experiencing any side effects to the medications prescribed?  no  Does the patient measure his/her own blood pressure or blood glucose at home?  no   Does the patient have any problems obtaining medications due to transportation or finances?   yes  Understanding of regimen: poor Understanding of indications: poor Potential of compliance: poor Patient understands to avoid NSAIDs. Patient understands to avoid decongestants.  Issues to address at subsequent visits: Compliance   Pharmacist comments:  Jacob [name] is a pleasant 60 yo M presenting with his new medication bottles that he picked up yesterday but has not started yet. Patient was recently discharged from hospital and all medications have been reviewed. Since his hospital d/c on 10/20 (admitted with acute exacerbation 2/2 non-compliance) he has not taken any of his medications 2/2 misplacing them. He did have an appointment yesterday with his PCP who administered 40 mg IM lasix for 11 pound weight gain since discharge. Had an in depth discussion about the importance of compliance with his medications and he promised to do better and to call with any issues. [name] Medical laboratory scientific officer) will start seeing him again.   Jacob [name]. Bonnye Fava, PharmD, BCPS, CPP Clinical Pharmacist Pager: [phone] Phone: [phone] 01/17/2016 11:10 AM       Time with patient: 20 minutes Preparation and documentation time: 5 minutes Total time: 25 minutes

## 2016-01-17 NOTE — Progress Notes (Signed)
Paramedicine Multidisciplinary Team Update  Jacob [name] is re-enrolled in the [employer] Program through [facility].  The patient presents today in association with an [facility] Clinic Appointment. Patient was previously on Paramedicine program and discharged due to non-compliance. Patient reports today requesting assistance of program and agrees to follow through with recommendations of program.  Patient living/home environment and social support-- Patient lives alone in an apartment which is pays $560 monthly rent. He has limited social supports and states he only get $540 monthly income from Social security. Patient states he previously received a higher amount from [employer] but "I received money from [place] and they cut my check". Patient reports he only received assistance from family overseas once.   Insurance/ Prescription Coverage-- Patient has medicaid.  Does the patient have a scale and weigh each day? Patient reports understanding of scale and weighing daily although has not been compliant lately.  Does the patient follow a low salt diet? Patient reports he has $162 monthly in food stamps although "has not used lately because I think there is someone more deserving". CSW discussed at length the need to use food stamp card and to eat heart healthy diet.  Is patient compliant with medications? Patient denies compliance and states eh has not had his medications recently but now has them and will begin with help of paramedicine program to be more compliant.  Does the patient have transportation for physician appointments? Patient drives own car.  Does the patient contact the HF Clinic appropriately with worsening symptoms or weight increases? Patient aware and has zone sheet to track and maintain weights.   Are there any identified obstacles / challenges for adherence to current treatment plan? CSW asked patient what are  the barriers to his compliance and he stated "I am confused". Patient reports he gets confused and needs the help of paramedicine program to help with compliance. Patient also reports financial needs. CSW encouraged patient to return to social security to have an appeal regarding monthly amount since he no longer receives money from [place] and is not working. CSW, HF Navigator and paramedic will continue to coordinate care and assist patient with compliance of HF program. Lasandra Beech, LCSW [phone]

## 2016-01-17 NOTE — Patient Instructions (Addendum)
RESTART Digoxin 0.0625 mg (1/2 tablet) once daily.  Return on Friday to follow up on fluid status.  Do the following things EVERYDAY: 1) Weigh yourself in the morning before breakfast. Write it down and keep it in a log. 2) Take your medicines as prescribed 3) Eat low salt foods-Limit salt (sodium) to 2000 mg per day.  4) Stay as active as you can everyday 5) Limit all fluids for the day to less than 2 liters

## 2016-01-17 NOTE — Progress Notes (Signed)
22g PIV started in RAC x 1 attempt per Amy Clegg NP-C. 80 mg IV lasix x 1 dose administered over 2 minutes. 40 PO potassium administered as well x 1 dose. Patient in CHF clinic room 3 with CHF Paramedic [name] and CHF SW [place]. Call bell and urinal in reach. Will continue to monitor closely.  Total UOP: 400 clear amber urine  PIV DC'd before patient discharged from OV today in wheelchair with instructions in hand.  Ave Filter, RN

## 2016-01-18 ENCOUNTER — Other Ambulatory Visit: Payer: Self-pay | Admitting: Family Medicine

## 2016-01-18 LAB — BRAIN NATRIURETIC PEPTIDE: BRAIN NATRIURETIC PEPTIDE: 1109.2 pg/mL — AB (ref <100)

## 2016-01-18 MED ORDER — LACTULOSE 10 GM/15ML PO SOLN: 10.0000 g | Freq: Every day | ORAL | 0 refills | Status: DC | PRN

## 2016-01-18 MED FILL — LACTULOSE 10 GM/15 ML SOLN: 10 | 16 days supply | Qty: 240 | Fill #0

## 2016-01-19 ENCOUNTER — Telehealth: Payer: Self-pay

## 2016-01-19 ENCOUNTER — Ambulatory Visit (HOSPITAL_COMMUNITY)
Admission: RE | Admit: 2016-01-19 | Discharge: 2016-01-19 | Disposition: A | Discharge status: Home / Self Care | Payer: Medicare Other | Source: Ambulatory Visit | Attending: Internal Medicine | Admitting: Internal Medicine

## 2016-01-19 ENCOUNTER — Encounter (HOSPITAL_COMMUNITY): Payer: Self-pay

## 2016-01-19 VITALS — BP 110/66 | HR 107 | Wt 220.5 lb

## 2016-01-19 DIAGNOSIS — Z9114 Patient's other noncompliance with medication regimen: Secondary | ICD-10-CM | POA: Diagnosis not present

## 2016-01-19 DIAGNOSIS — I482 Chronic atrial fibrillation: Secondary | ICD-10-CM | POA: Diagnosis not present

## 2016-01-19 DIAGNOSIS — I110 Hypertensive heart disease with heart failure: Secondary | ICD-10-CM | POA: Diagnosis not present

## 2016-01-19 DIAGNOSIS — Z79899 Other long term (current) drug therapy: Secondary | ICD-10-CM | POA: Diagnosis not present

## 2016-01-19 DIAGNOSIS — Z7901 Long term (current) use of anticoagulants: Secondary | ICD-10-CM

## 2016-01-19 DIAGNOSIS — E1151 Type 2 diabetes mellitus with diabetic peripheral angiopathy without gangrene: Secondary | ICD-10-CM | POA: Insufficient documentation

## 2016-01-19 DIAGNOSIS — E785 Hyperlipidemia, unspecified: Secondary | ICD-10-CM | POA: Insufficient documentation

## 2016-01-19 DIAGNOSIS — I4891 Unspecified atrial fibrillation: Secondary | ICD-10-CM

## 2016-01-19 DIAGNOSIS — Z794 Long term (current) use of insulin: Secondary | ICD-10-CM | POA: Insufficient documentation

## 2016-01-19 DIAGNOSIS — Z95810 Presence of automatic (implantable) cardiac defibrillator: Secondary | ICD-10-CM | POA: Diagnosis not present

## 2016-01-19 DIAGNOSIS — F17210 Nicotine dependence, cigarettes, uncomplicated: Secondary | ICD-10-CM | POA: Diagnosis not present

## 2016-01-19 DIAGNOSIS — I5042 Chronic combined systolic (congestive) and diastolic (congestive) heart failure: Secondary | ICD-10-CM | POA: Diagnosis not present

## 2016-01-19 DIAGNOSIS — I5022 Chronic systolic (congestive) heart failure: Secondary | ICD-10-CM | POA: Diagnosis not present

## 2016-01-19 DIAGNOSIS — I255 Ischemic cardiomyopathy: Secondary | ICD-10-CM | POA: Diagnosis not present

## 2016-01-19 LAB — BASIC METABOLIC PANEL
ANION GAP: 11 (ref 5–15)
BUN: 33 mg/dL — AB (ref 6–20)
CHLORIDE: 93 mmol/L — AB (ref 101–111)
CO2: 28 mmol/L (ref 22–32)
Calcium: 9 mg/dL (ref 8.9–10.3)
Creatinine, Ser: 1.44 mg/dL — ABNORMAL HIGH (ref 0.61–1.24)
GFR calc Af Amer: >60 mL/min (ref >60)
GFR, EST NON AFRICAN AMERICAN: 52 mL/min — AB (ref >60)
GLUCOSE: 284 mg/dL — AB (ref 65–99)
POTASSIUM: 4.7 mmol/L (ref 3.5–5.1)
SODIUM: 132 mmol/L — AB (ref 135–145)

## 2016-01-19 LAB — BRAIN NATRIURETIC PEPTIDE: B NATRIURETIC PEPTIDE 5: 389.7 pg/mL — AB (ref 0.0–100.0)

## 2016-01-19 NOTE — Progress Notes (Signed)
Patient ID: Jacob [name], male   DOB: [birth year], 60 y.o.   MRN: [id number]    Advanced Heart Failure Clinic Note   PCP: None  Primary HF Cardiologist: Dr Gala RomneyBensimhon   HPI: Mr [name] is a 60 year old [place] male with history of systolic HF EF 20-25% via Echo 08/03/14 s/p INCEPTA ICD implant 6/14, RV dysfunction, CAD s/p CABG x 2 w/ RF MAZE 7/14 at [facility], DM type II, Chronic afib on xarelto, GI bleed 11/28/2014, and HLD.   Admitted the end of August 2016  with increased dyspnea on exertion. Later found to be in cardiogenic shock . At one point on dual pressors milrinone and norepi. Diuresed with IV lasix and transitioned to po lasix. Hospital course complicated by GI bleed and A fib RVR. Loaded on amio but later placed on toprol xl for rate control. On 9/5 had EGD with duodenal bleed with clip applied. He was discharged 9/12 with D/C weight 203 pounds.   Admitted late December 2016 with increased dyspnea. Diuresed with IV lasix and transitioned to po lasix 80 mg twice a day. Chronic A fib and continued on amio 200 mg twice a day. Discharge weight was 205 pounds.   Admitted 10/9 through 01/12/16 with marked volume overload. Diuresed with IV lasix and milrinone. D/C weight 209 pounds. He was provided medications at discharge.   Today he returns for HF follow up. Earlier this week he was given IV lasix. He says he has all medications except insulin. Yesterday he took his medications but not today. Weight at home down from 220 to 218 pounds. Denies SOB/PND/Orthopnea. Followed by paramedicine but hasn't been seen since discharge. Has difficulty paying for gas.   SH: Lives in low income housing. Estranged from family  Labs 05/17/2015: K 3.9 Creatinine 1.23  Labs 08/15/2015: K 4.1 Creatinine 1.41 Labs 01/12/2016: K 4.4 Creatinine 1.75   Labs 01/16/2016: K 4.7 Creatinine 1.02    ROS: All systems negative except as listed in HPI, PMH and Problem List.  SH:  Social History   Social History  . Marital  status: Divorced    Spouse name: N/A  . Number of children: 3  . Years of education: N/A   Occupational History  . disabled    Social History Main Topics  . Smoking status: Current Every Day Smoker    Packs/day: 0.50    Years: 40.00    Types: Cigarettes  . Smokeless tobacco: Never Used  . Alcohol use No  . Drug use: No  . Sexual activity: No   Other Topics Concern  . Not on file   Social History Narrative   Has an apartment with a roommate. He was living on the streets in October 2014.  He reports that his father died in [place] in October 2014.  He is divorced.  He is no longer estranged from his son, but still from his daughter who lives locally.  Neither of his parents, nor any siblings have any history of CAD.    FH:  Family History  Problem Relation Age of Onset  . Diabetes Mother     Past Medical History:  Diagnosis Date  . Atrial fibrillation (HCC)    RVR 10/2014  . Automatic implantable cardioverter-defibrillator in situ   . CAD (coronary artery disease) Sept 2013   s/p cardiac cath showing occlusion of small RCA with collaterals  . CHF (congestive heart failure) (HCC)    20 to 25 % EF and RV dysfunction by 07/2014 echo   .  Chronic anticoagulation    on xarelto.   . High cholesterol   . Hypertension   . Myocardial infarction 2014  . Noncompliance    homelessness contributing.   . Peripheral arterial disease (HCC)   . Type II diabetes mellitus (HCC)     Current Outpatient Prescriptions  Medication Sig Dispense Refill  . albuterol (PROVENTIL HFA;VENTOLIN HFA) 108 (90 Base) MCG/ACT inhaler Inhale 2 puffs into the lungs every 6 (six) hours as needed for wheezing or shortness of breath. 1 Inhaler 2  . albuterol (PROVENTIL) (2.5 MG/3ML) 0.083% nebulizer solution Take 3 mLs (2.5 mg total) by nebulization every 6 (six) hours as needed for wheezing or shortness of breath. 150 mL 1  . amiodarone (PACERONE) 200 MG tablet Take 1 tablet (200 mg total) by mouth daily. 30  tablet 1  . atorvastatin (LIPITOR) 40 MG tablet Take 1 tablet (40 mg total) by mouth daily at 6 PM. 30 tablet 2  . digoxin (LANOXIN) 0.125 MG tablet Take 0.5 tablets (0.0625 mg total) by mouth daily. 15 tablet 5  . ferrous sulfate 325 (65 FE) MG EC tablet Take 325 mg by mouth 3 (three) times daily with meals.     . gabapentin (NEURONTIN) 300 MG capsule Take 1 capsule (300 mg total) by mouth 2 (two) times daily. 60 capsule 5  . glipiZIDE (GLUCOTROL) 10 MG tablet Take 1 tablet (10 mg total) by mouth 2 (two) times daily before a meal. 60 tablet 2  . hydrALAZINE (APRESOLINE) 25 MG tablet Take 0.5 tablets (12.5 mg total) by mouth 3 (three) times daily. 90 tablet 1  . isosorbide mononitrate (IMDUR) 30 MG 24 hr tablet Take 1 tablet (30 mg total) by mouth daily. 30 tablet 1  . lactulose (CHRONULAC) 10 GM/15ML solution Take 15 mLs (10 g total) by mouth daily as needed for mild constipation. 240 mL 0  . spironolactone (ALDACTONE) 25 MG tablet Take 25 mg by mouth daily.    Marland Kitchen torsemide (DEMADEX) 20 MG tablet Take 4 tablets (80 mg total) by mouth 2 (two) times daily. 240 tablet 3  . ACCU-CHEK SOFTCLIX LANCETS lancets Use for once daily testing of blood sugar (Patient not taking: Reported on 01/19/2016) 100 each 12  . Blood Glucose Monitoring Suppl (ACCU-CHEK AVIVA) device Use as instructed1 times daily before meals (Patient not taking: Reported on 01/19/2016) 1 each 0  . glucose blood (ACCU-CHEK AVIVA) test strip Use as instructed for 1 times daily testing of blood sugar (Patient not taking: Reported on 01/19/2016) 100 each 12  . Insulin Glargine (LANTUS) 100 UNIT/ML Solostar Pen Inject 12 Units into the skin daily at 10 pm. (Patient not taking: Reported on 01/19/2016) 15 mL 11  . Insulin Pen Needle 31G X 5 MM MISC Use as directed for once daily insulin injection 100 each 2  . Lancet Devices (ACCU-CHEK SOFTCLIX) lancets Use as instructed for once daily testing of blood sugar (Patient not taking: Reported on  01/17/2016) 1 each 0  . meclizine (ANTIVERT) 25 MG tablet Take 25 mg by mouth 2 (two) times daily as needed for nausea.    . rivaroxaban (XARELTO) 20 MG TABS tablet Take 1 tablet (20 mg total) by mouth daily before supper. (Patient not taking: Reported on 01/19/2016) 30 tablet 5   No current facility-administered medications for this encounter.     Vitals:   01/19/16 1110  BP: 110/66  Pulse: (!) 107  SpO2: 98%  Weight: 220 lb 8 oz (100 kg)   Wt Readings  from Last 3 Encounters:  01/19/16 220 lb 8 oz (100 kg)  01/17/16 221 lb 9.6 oz (100.5 kg)  01/16/16 220 lb 6.4 oz (100 kg)      PHYSICAL EXAM: General:  Chronically ill appearing. No resp difficulty. Arrived in a wheelchair HEENT: normal Neck: supple. JVP ~10. Carotids 2+ bilaterally; no bruits. No thyromegaly or nodule noted. Cor: PMI normal. Irregular rate & rhythm, slightly tachy. No rubs, gallops or murmurs. Lungs: Clear  Abdomen: soft, NT, ND, no HSM. No bruits or masses. +BS  Extremities: no cyanosis, clubbing, rash, R and LLE trace edema.  Neuro: alert & orientedx3, cranial nerves grossly intact. Moves all 4 extremities w/o difficulty. Affect pleasant.   ASSESSMENT & PLAN: 1.Chronic Systolic Heart Failure - ICM EF 20-25%. Has [employer] ICD.  NYHA II-III. Improved today. Still with volume overload however he did not take meds this morning.  -He will continue  torsemide to 80 mg BID.  Hopefully with compliance he will continue to diurese.  - Not on spiro due to compliance.  - No BB with recent cardiogenic shock.  - No ace for now with soft SBP.  Continue current dose of hydralazine/imdur/digoxin.  BMET BNP today - He is not a candidate for advanced therapies given social situation and non compliance.  2. Chronic A fib -   Rate controlled. Continue Amio 200 mg twice a day,  Xarelto. No bleeding problems.   Needs yearly eye exams.  3. DMII- Per PCP  4. H/O GI Bleed - duodenal ulcer with clip 9/5 5. PAD- Last ABI  2015 . R EIA stent with known bilateral SFA occlusion.  Repeat ABI ok. Dr Allyson Sabal recommends yearly ABI.   5. Social  Issues-Currently resides in low income housing. Having difficulty paying for housing again. HF SW following.  6. Smoking- Current smoker. Counseled on smoking cessation.   Follow up in weekly for 4 weeks. Continue Paramedicine. He is at high risk for readmit due to noncompliance.    Amy Clegg NP-C   11:16 AM

## 2016-01-19 NOTE — Patient Instructions (Signed)
Routine lab work today. Will notify you of abnormal results   Follow up in  7days.

## 2016-01-19 NOTE — Telephone Encounter (Signed)
This Case Manager informed Dr. Venetia Night that [facility] Case Manager recommending [facility] RN for diabetes management. Dr. Venetia Night declined because she thought adding [facility] RN services would be an overlap of care since patient has follow-up with the Paramedicine program. She did think patient may benefit from personal care services if agreeable. This Case Manager placed call to Lenna Gilford [phone]) with [facility] to provide update. Unable to reach her; voicemail left requesting return call. Call placed to patient at #[phone] to determine if agreeble to personal care services. Unable to reach patient; HIPPA compliant voicemail left requesting return call.

## 2016-01-22 ENCOUNTER — Telehealth: Payer: Self-pay

## 2016-01-22 NOTE — Telephone Encounter (Signed)
Call received from Clinton Quant, [facility] clinical caseworker. She stated that she saw the patient today and he was doing much better than when she saw him in the clinic last week.  She stated that he went shopping, got a haircut and picked up all of his medications except the needles for the lantus pen. He has not had any insulin since discharge from the hospital.    As per Carlis Stable, [facility], the needles are covered under medicare part B and the prescription for them can't be filled at [facility]. B. Aurther Loft stated that the patient needs [facility] glucom[facility]. She said that the [facility].  She also said that he will be going to the CHF clinic weekly and they will fill his medication box.  At some point, they may consider compliance packaging from a local pharmacy.   B. Aurther Loft also stated that he now has a couch and chair for his apartment. He still needs a bedside table and he will then be able to participate in the home tele-monitoring program.  She said that he is agreeable to [facility] services.   She also said that he received another phone from [employer] and the phone is not working so he will need to return to [employer]. She noted that he informed her that he will not be working for a couple of weeks because  he needs the rest.   No other questions/concerns reported at this time

## 2016-01-23 ENCOUNTER — Telehealth: Payer: Self-pay

## 2016-01-23 NOTE — Telephone Encounter (Signed)
Writer LVM   Asking patient to call back to discuss lab results.

## 2016-01-23 NOTE — Telephone Encounter (Signed)
-----   Message from Jaclyn Shaggy, MD sent at 01/18/2016  1:56 PM EDT ----- Elevated BNP due to noncompliance with Lasix. Encouraged to be more compliant. His ammonia level is also slightly elevated and I have sent a prescription for lactulose to his pharmacy which could cause some diarrhea.

## 2016-01-24 ENCOUNTER — Telehealth: Payer: Self-pay | Admitting: *Deleted

## 2016-01-24 NOTE — Telephone Encounter (Signed)
Patient verified DOB Patient is aware of BNP being elevated due to noncompliance of lasix. Patient is also aware of ammonia level being elevated and patient needing to begin lactulose which was sent to [facility] pharmacy. Patient advised to take medications as prescribed daily. Patient expressed his understanding and had no further questions at this time.

## 2016-01-25 ENCOUNTER — Inpatient Hospital Stay (HOSPITAL_COMMUNITY): Admission: RE | Admit: 2016-01-25 | Payer: Medicare Other | Source: Ambulatory Visit

## 2016-01-29 ENCOUNTER — Telehealth: Payer: Self-pay

## 2016-01-29 NOTE — Telephone Encounter (Signed)
Attempted to contact the patient to confirm his appointment at the [facility] tomorrow, 01/30/16 @ 1530.  Call placed to # [phone] (H) and a HIPAA compliant voicemail message was left requesting a call back to # [phone] or [phone].   Call placed to Clinton Quant, [facility] # [phone] to inform her that this CM was not able to reach the patient this morning. She stated that she is supposed to be seeing him today, 01/29/16 @ 1430 and she will remind him of the appointment. She stated that she was supposed to meet with him on 01/26/16 but he was not home when she went to his house. He was at the store and was not home in time to meet with her despite her waiting 20 minutes for him. She stated that she had planned to review the use of the lantus pen with him,  Informed her that he was a no show for his appointment at the CHF clinic on 01/25/16.

## 2016-01-30 ENCOUNTER — Telehealth: Payer: Self-pay

## 2016-01-30 ENCOUNTER — Encounter: Payer: Self-pay | Admitting: Family Medicine

## 2016-01-30 ENCOUNTER — Ambulatory Visit: Payer: Medicare Other | Attending: Family Medicine | Admitting: Family Medicine

## 2016-01-30 VITALS — BP 104/57 | HR 84 | Temp 98.4°F | Ht 65.0 in | Wt 212.4 lb

## 2016-01-30 DIAGNOSIS — Z9889 Other specified postprocedural states: Secondary | ICD-10-CM | POA: Insufficient documentation

## 2016-01-30 DIAGNOSIS — Z9119 Patient's noncompliance with other medical treatment and regimen: Secondary | ICD-10-CM | POA: Insufficient documentation

## 2016-01-30 DIAGNOSIS — I5042 Chronic combined systolic (congestive) and diastolic (congestive) heart failure: Secondary | ICD-10-CM | POA: Insufficient documentation

## 2016-01-30 DIAGNOSIS — I2510 Atherosclerotic heart disease of native coronary artery without angina pectoris: Secondary | ICD-10-CM | POA: Diagnosis not present

## 2016-01-30 DIAGNOSIS — E7800 Pure hypercholesterolemia, unspecified: Secondary | ICD-10-CM | POA: Diagnosis not present

## 2016-01-30 DIAGNOSIS — I110 Hypertensive heart disease with heart failure: Secondary | ICD-10-CM | POA: Insufficient documentation

## 2016-01-30 DIAGNOSIS — Z794 Long term (current) use of insulin: Secondary | ICD-10-CM | POA: Diagnosis not present

## 2016-01-30 DIAGNOSIS — E1151 Type 2 diabetes mellitus with diabetic peripheral angiopathy without gangrene: Secondary | ICD-10-CM | POA: Insufficient documentation

## 2016-01-30 DIAGNOSIS — I10 Essential (primary) hypertension: Secondary | ICD-10-CM | POA: Diagnosis not present

## 2016-01-30 DIAGNOSIS — Z79899 Other long term (current) drug therapy: Secondary | ICD-10-CM | POA: Diagnosis not present

## 2016-01-30 DIAGNOSIS — Z720 Tobacco use: Secondary | ICD-10-CM

## 2016-01-30 DIAGNOSIS — I4891 Unspecified atrial fibrillation: Secondary | ICD-10-CM | POA: Diagnosis not present

## 2016-01-30 DIAGNOSIS — I252 Old myocardial infarction: Secondary | ICD-10-CM | POA: Diagnosis not present

## 2016-01-30 DIAGNOSIS — Z7901 Long term (current) use of anticoagulants: Secondary | ICD-10-CM | POA: Insufficient documentation

## 2016-01-30 LAB — GLUCOSE, POCT (MANUAL RESULT ENTRY): POC GLUCOSE: 200 mg/dL — AB (ref 70–99)

## 2016-01-30 MED ORDER — INSULIN GLARGINE 100 UNIT/ML SOLOSTAR PEN: 15.0000 [IU] | PEN_INJECTOR | Freq: Every day | SUBCUTANEOUS | 5 refills | Status: DC

## 2016-01-30 NOTE — Telephone Encounter (Signed)
Attempted to contact Seymour, to inquire if she was able to meet with the patient yesterday. Call placed to # [phone] and a HIPAA compliant voicemail message was left requesting a call back to # [phone] or [phone].   Call placed to the patient and confirmed his appointment at [facility] for today at 1530. He stated that he doesn't have transportation to the clinic today and he was agreeable to having a cab pick him up. He confirmed that he is still at the address noted in EPIC.  Jacklynn Lewis, [facility] scheduler notified of the need for a cab.   The patient stated that he has been checking his blood sugars every morning, This morning his blood sugar was 142 and he also noted that he is keeping a log of the blood sugars. He noted that he has been taking his insulin every night as ordered. Instructed him to bring all of his medications and blood sugar log with him to the clinic today and he stated that he would.   He confirmed that Flossie Buffy, [facility] met with him yesterday and is meeting him at [facility] today for his appointment.  He also noted that [name] with paramedicine program is meeting him at his house on Wednesday, 01/31/16.

## 2016-01-30 NOTE — Telephone Encounter (Signed)
Met with the patient when he was in the clinic today to see Dr Jarold Song. He was accompanied by Markham Jordan, [facility]. He also met with Nicoletta Ba, RPH to review use of the insulin pen. Glee Arvin said that she is planning to see the patient at home again tomorrow and Joellen Jersey with the para-medicine program is also seeing at home tomorrow to set up his medications for him.  He was very positive and committed to adhering to his medical plan of care in order to remain out of the hospital .

## 2016-01-30 NOTE — Telephone Encounter (Signed)
Patient was here for MD visit lab results discussed.

## 2016-01-30 NOTE — Telephone Encounter (Signed)
PCS referral faxed to [facility]  # [phone].

## 2016-01-30 NOTE — Patient Instructions (Signed)
Diabetes Mellitus and Food It is important for you to manage your blood sugar (glucose) level. Your blood glucose level can be greatly affected by what you eat. Eating healthier foods in the appropriate amounts throughout the day at about the same time each day will help you control your blood glucose level. It can also help slow or prevent worsening of your diabetes mellitus. Healthy eating may even help you improve the level of your blood pressure and reach or maintain a healthy weight.  General recommendations for healthful eating and cooking habits include:  Eating meals and snacks regularly. Avoid going long periods of time without eating to lose weight.  Eating a diet that consists mainly of plant-based foods, such as fruits, vegetables, nuts, legumes, and whole grains.  Using low-heat cooking methods, such as baking, instead of high-heat cooking methods, such as deep frying. Work with your dietitian to make sure you understand how to use the Nutrition Facts information on food labels. HOW CAN FOOD AFFECT ME? Carbohydrates Carbohydrates affect your blood glucose level more than any other type of food. Your dietitian will help you determine how many carbohydrates to eat at each meal and teach you how to count carbohydrates. Counting carbohydrates is important to keep your blood glucose at a healthy level, especially if you are using insulin or taking certain medicines for diabetes mellitus. Alcohol Alcohol can cause sudden decreases in blood glucose (hypoglycemia), especially if you use insulin or take certain medicines for diabetes mellitus. Hypoglycemia can be a life-threatening condition. Symptoms of hypoglycemia (sleepiness, dizziness, and disorientation) are similar to symptoms of having too much alcohol.  If your health care provider has given you approval to drink alcohol, do so in moderation and use the following guidelines:  Women should not have more than one drink per day, and men  should not have more than two drinks per day. One drink is equal to:  12 oz of beer.  5 oz of wine.  1 oz of hard liquor.  Do not drink on an empty stomach.  Keep yourself hydrated. Have water, diet soda, or unsweetened iced tea.  Regular soda, juice, and other mixers might contain a lot of carbohydrates and should be counted. WHAT FOODS ARE NOT RECOMMENDED? As you make food choices, it is important to remember that all foods are not the same. Some foods have fewer nutrients per serving than other foods, even though they might have the same number of calories or carbohydrates. It is difficult to get your body what it needs when you eat foods with fewer nutrients. Examples of foods that you should avoid that are high in calories and carbohydrates but low in nutrients include:  Trans fats (most processed foods list trans fats on the Nutrition Facts label).  Regular soda.  Juice.  Candy.  Sweets, such as cake, pie, doughnuts, and cookies.  Fried foods. WHAT FOODS CAN I EAT? Eat nutrient-rich foods, which will nourish your body and keep you healthy. The food you should eat also will depend on several factors, including:  The calories you need.  The medicines you take.  Your weight.  Your blood glucose level.  Your blood pressure level.  Your cholesterol level. You should eat a variety of foods, including:  Protein.  Lean cuts of meat.  Proteins low in saturated fats, such as fish, egg whites, and beans. Avoid processed meats.  Fruits and vegetables.  Fruits and vegetables that may help control blood glucose levels, such as apples, mangoes, and   yams.  Dairy products.  Choose fat-free or low-fat dairy products, such as milk, yogurt, and cheese.  Grains, bread, pasta, and rice.  Choose whole grain products, such as multigrain bread, whole oats, and brown rice. These foods may help control blood pressure.  Fats.  Foods containing healthful fats, such as nuts,  avocado, olive oil, canola oil, and fish. DOES EVERYONE WITH DIABETES MELLITUS HAVE THE SAME MEAL PLAN? Because every person with diabetes mellitus is different, there is not one meal plan that works for everyone. It is very important that you meet with a dietitian who will help you create a meal plan that is just right for you.   This information is not intended to replace advice given to you by your health care provider. Make sure you discuss any questions you have with your health care provider.   Document Released: 12/06/2004 Document Revised: 04/01/2014 Document Reviewed: 02/05/2013 Elsevier Interactive Patient Education 2016 [employer]  

## 2016-01-30 NOTE — Progress Notes (Signed)
[facility]  Subjective:    Patient ID: Jacob [name], male    DOB: 06-15-1955, 60 y.o.   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  At his last visit he received refills of all his medications as he had misplaced them previously and had to receive 40mg  IM Lasix in the clinic; he also received IV Lasix 80 mg and 40meq of potassium at his Cardiology appointment the next day and has had a follow up with Cardiology after that.  The dizziness he complained of at his last office visit has resolved and he denies confusion which he also had at that time. Followed by para medicine program. He has all his medications with him today and endorses compliance; weight log reveals values between 210 and 212. Fasting sugars between 144 and 200. No acute concerns today  Past Medical History:  Diagnosis Date  . Atrial fibrillation (HCC)    RVR 10/2014  . Automatic implantable cardioverter-defibrillator in situ   . CAD (coronary artery disease) Sept 2013   s/p cardiac cath showing occlusion of small RCA with collaterals  . CHF (congestive heart failure) (HCC)    20 to 25 % EF and RV dysfunction by 07/2014 echo   . Chronic anticoagulation    on xarelto.   . High cholesterol   . Hypertension   . Myocardial infarction 2014  . Noncompliance    homelessness contributing.   . Peripheral arterial disease (HCC)   . Type II diabetes mellitus (HCC)     Past Surgical History:  Procedure Laterality Date  . CARDIAC CATHETERIZATION  09/2012  . CORONARY ANGIOPLASTY WITH STENT PLACEMENT  11/2011   "1"  . CORONARY ARTERY BYPASS GRAFT  09/2012   2 vessels per  patient [name]([facility])   . ESOPHAGOGASTRODUODENOSCOPY N/A 11/28/2014   Procedure: ESOPHAGOGASTRODUODENOSCOPY (EGD);  Surgeon: Beverley FiedlerJay M Pyrtle, MD;  [facility];  Service: Endoscopy;  Laterality: N/A;  . ILIAC ARTERY STENT Right 08/30/2013  . IMPLANTABLE CARDIOVERTER DEFIBRILLATOR IMPLANT     [place] in 07/2012; [employer]  . LEFT AND RIGHT HEART CATHETERIZATION WITH CORONARY ANGIOGRAM N/A 12/02/2011   Procedure: LEFT AND RIGHT HEART CATHETERIZATION WITH CORONARY ANGIOGRAM;  Surgeon: Kathleene Hazelhristopher D McAlhany, MD;  [facility];  Service: Cardiovascular;  Laterality: N/A;  . LOWER EXTREMITY ANGIOGRAM N/A 08/30/2013   Procedure: LOWER EXTREMITY ANGIOGRAM;  Surgeon: Runell GessJonathan J Berry, MD;  [facility];  Service: Cardiovascular;  Laterality: N/A;  . LOWER EXTREMITY ANGIOGRAM N/A 12/02/2013   Procedure: LOWER EXTREMITY ANGIOGRAM;  Surgeon: Runell GessJonathan J Berry, MD;  [facility];  Service: Cardiovascular;  Laterality: N/A;    Current Outpatient Prescriptions on File Prior to Visit  Medication Sig Dispense Refill  . ACCU-CHEK SOFTCLIX LANCETS lancets Use for once daily testing of blood sugar 100 each 12  . albuterol (PROVENTIL HFA;VENTOLIN HFA) 108 (90 Base) MCG/ACT inhaler Inhale 2 puffs into the lungs every 6 (six) hours as needed for wheezing or shortness of breath. 1 Inhaler 2  . albuterol (PROVENTIL) (2.5 MG/3ML) 0.083% nebulizer solution Take 3 mLs (2.5 mg total) by nebulization every 6 (six) hours as needed for wheezing or shortness of  breath. 150 mL 1  . amiodarone (PACERONE) 200 MG tablet Take 1 tablet (200 mg total) by mouth daily. 30 tablet 1  . atorvastatin (LIPITOR) 40 MG tablet Take 1 tablet (40 mg total) by mouth daily at 6 PM. 30 tablet 2  . Blood Glucose Monitoring Suppl (ACCU-CHEK AVIVA) device Use as instructed1 times daily before meals 1 each 0  . digoxin (LANOXIN) 0.125 MG tablet Take 0.5 tablets (0.0625 mg total) by mouth daily. 15 tablet 5  . ferrous sulfate  325 (65 FE) MG EC tablet Take 325 mg by mouth 3 (three) times daily with meals.     . gabapentin (NEURONTIN) 300 MG capsule Take 1 capsule (300 mg total) by mouth 2 (two) times daily. 60 capsule 5  . glipiZIDE (GLUCOTROL) 10 MG tablet Take 1 tablet (10 mg total) by mouth 2 (two) times daily before a meal. 60 tablet 2  . glucose blood (ACCU-CHEK AVIVA) test strip Use as instructed for 1 times daily testing of blood sugar 100 each 12  . hydrALAZINE (APRESOLINE) 25 MG tablet Take 0.5 tablets (12.5 mg total) by mouth 3 (three) times daily. 90 tablet 1  . Insulin Pen Needle 31G X 5 MM MISC Use as directed for once daily insulin injection 100 each 2  . isosorbide mononitrate (IMDUR) 30 MG 24 hr tablet Take 1 tablet (30 mg total) by mouth daily. 30 tablet 1  . lactulose (CHRONULAC) 10 GM/15ML solution Take 15 mLs (10 g total) by mouth daily as needed for mild constipation. 240 mL 0  . Lancet Devices (ACCU-CHEK SOFTCLIX) lancets Use as instructed for once daily testing of blood sugar 1 each 0  . meclizine (ANTIVERT) 25 MG tablet Take 25 mg by mouth 2 (two) times daily as needed for nausea.    . rivaroxaban (XARELTO) 20 MG TABS tablet Take 1 tablet (20 mg total) by mouth daily before supper. 30 tablet 5  . spironolactone (ALDACTONE) 25 MG tablet Take 25 mg by mouth daily.    Marland Kitchen torsemide (DEMADEX) 20 MG tablet Take 4 tablets (80 mg total) by mouth 2 (two) times daily. 240 tablet 3   No current facility-administered medications on file prior to visit.     Review of Systems  Constitutional: Negative for activity change and appetite change.  HENT: Negative for sinus pressure and sore throat.   Eyes: Negative for visual disturbance.  Respiratory: Negative for cough, chest tightness and shortness of breath.   Cardiovascular: Negative for chest pain and leg swelling.  Gastrointestinal: Negative for abdominal distention, abdominal pain, constipation and diarrhea.  Endocrine: Negative.   Genitourinary:  Negative for dysuria.  Musculoskeletal: Negative for joint swelling and myalgias.  Skin: Negative for rash.  Allergic/Immunologic: Negative.   Neurological: Negative for weakness, light-headedness and numbness.  Psychiatric/Behavioral: Negative for dysphoric mood and suicidal ideas.       Objective: Vitals:   01/30/16 1525  BP: (!) 104/57  Pulse: 84  Temp: 98.4 F (36.9 C)  TempSrc: Oral  SpO2: 92%  Weight: 212 lb 6.4 oz (96.3 kg)  Height: 5\' 5"  (1.651 m)    Wt Readings from Last 3 Encounters:  01/30/16 212 lb 6.4 oz (96.3 kg)  01/19/16 220 lb 8 oz (100 kg)  01/17/16 221 lb 9.6 oz (100.5 kg)      Physical Exam Constitutional: He is oriented to person, place, and time. He appears well-developed and well-nourished.  Neck: Normal range of motion. +JVD. Cardiovascular: Normal rate, regular rhythm and normal heart  sounds.   No murmur heard. Pulmonary/Chest: Effort normal and breath sounds normal. No respiratory distress. He has no wheezes. He exhibits no tenderness.  Abdominal: Soft. Bowel sounds are normal. He exhibits no mass. There is no tenderness.  Musculoskeletal: Normal range of motion. He exhibits no edema or tenderness.  Neurological: He is alert and oriented to person, place, and time  Skin: Skin is warm and dry.  Psychiatric: He has a normal mood and affect.       Assessment & Plan:  1. DM (diabetes mellitus), type 2 with peripheral vascular complications (HCC) Uncontrolled with A1c of 11.0 largely due to noncompliance. Lantus increased to 15 units at bedtime Compliance has been emphasized A1c at next visit We'll review blood sugar log at next visit. - Glucose (CBG)  2. Chronic systolic CHF (congestive heart failure), NYHA class 3 (HCC)  EF of 25-30% from 2-D echo of 10/2015 Euvolemic Weight is down 9 pounds in the last 2 weeks and he has been commended Continue para medicine program, daily weight checks, low-sodium diet Continue torsemide, isosorbide and  hydralazine  3. Atrial fibrillation with controlled ventricular response (HCC) Currently on anticoagulation with Xarelto and rate control with amiodarone We'll send off thyroid function at next visit (due to high-risk medication use)  4. Hypertension Blood pressure is on the soft side He is asymptomatic.  5. Tobacco abuse Spent 3 minutes counseling on cessation and he is not ready to quit at this time.

## 2016-01-31 ENCOUNTER — Telehealth: Payer: Self-pay

## 2016-01-31 NOTE — Telephone Encounter (Signed)
This Case Manager received call from [name] with Paramedicine Program. She indicated she was at patient's home doing medication teaching. She indicated patient's blood glucose ranging from "120's-140's." She inquired if there were parameters in which patient would hold Lantus dose. Discussed with Dr. Venetia Night, and she indicated that since Lantus is a  long-acting insulin there are not parameters for holding medication. [name] with Paramedicine program updated. She indicated she would update patient. No additional needs identified at this time.

## 2016-02-01 NOTE — Telephone Encounter (Signed)
Opened in error

## 2016-02-02 ENCOUNTER — Ambulatory Visit (HOSPITAL_COMMUNITY)
Admission: RE | Admit: 2016-02-02 | Discharge: 2016-02-02 | Disposition: A | Discharge status: Home / Self Care | Payer: Medicare Other | Source: Ambulatory Visit | Attending: Cardiology | Admitting: Cardiology

## 2016-02-02 ENCOUNTER — Encounter (HOSPITAL_COMMUNITY): Payer: Self-pay

## 2016-02-02 VITALS — BP 138/82 | HR 96 | Wt 215.6 lb

## 2016-02-02 DIAGNOSIS — F17210 Nicotine dependence, cigarettes, uncomplicated: Secondary | ICD-10-CM | POA: Diagnosis not present

## 2016-02-02 DIAGNOSIS — E119 Type 2 diabetes mellitus without complications: Secondary | ICD-10-CM | POA: Insufficient documentation

## 2016-02-02 DIAGNOSIS — I482 Chronic atrial fibrillation, unspecified: Secondary | ICD-10-CM

## 2016-02-02 DIAGNOSIS — I2510 Atherosclerotic heart disease of native coronary artery without angina pectoris: Secondary | ICD-10-CM | POA: Diagnosis not present

## 2016-02-02 DIAGNOSIS — I5042 Chronic combined systolic (congestive) and diastolic (congestive) heart failure: Secondary | ICD-10-CM | POA: Diagnosis not present

## 2016-02-02 DIAGNOSIS — Z7901 Long term (current) use of anticoagulants: Secondary | ICD-10-CM | POA: Diagnosis not present

## 2016-02-02 DIAGNOSIS — E7800 Pure hypercholesterolemia, unspecified: Secondary | ICD-10-CM | POA: Insufficient documentation

## 2016-02-02 DIAGNOSIS — I739 Peripheral vascular disease, unspecified: Secondary | ICD-10-CM | POA: Diagnosis not present

## 2016-02-02 DIAGNOSIS — Z95810 Presence of automatic (implantable) cardiac defibrillator: Secondary | ICD-10-CM | POA: Insufficient documentation

## 2016-02-02 DIAGNOSIS — I252 Old myocardial infarction: Secondary | ICD-10-CM | POA: Insufficient documentation

## 2016-02-02 DIAGNOSIS — I5022 Chronic systolic (congestive) heart failure: Secondary | ICD-10-CM | POA: Diagnosis not present

## 2016-02-02 DIAGNOSIS — Z79899 Other long term (current) drug therapy: Secondary | ICD-10-CM | POA: Insufficient documentation

## 2016-02-02 DIAGNOSIS — Z794 Long term (current) use of insulin: Secondary | ICD-10-CM | POA: Diagnosis not present

## 2016-02-02 DIAGNOSIS — Z951 Presence of aortocoronary bypass graft: Secondary | ICD-10-CM | POA: Insufficient documentation

## 2016-02-02 DIAGNOSIS — I110 Hypertensive heart disease with heart failure: Secondary | ICD-10-CM | POA: Diagnosis not present

## 2016-02-02 DIAGNOSIS — Z833 Family history of diabetes mellitus: Secondary | ICD-10-CM | POA: Diagnosis not present

## 2016-02-02 DIAGNOSIS — F17200 Nicotine dependence, unspecified, uncomplicated: Secondary | ICD-10-CM

## 2016-02-02 NOTE — Patient Instructions (Signed)
Follow up in clinic in 1 week.

## 2016-02-02 NOTE — Progress Notes (Signed)
Patient ID: Jacob [name], male   DOB: 1955/12/24, 60 y.o.   MRN: [id number]    Advanced Heart Failure Clinic Note   PCP: None  Primary HF Cardiologist: Dr Gala Romney   HPI: Jacob [name] is a 60 year old [place] male with history of systolic HF EF 20-25% via Echo 08/03/14 s/p INCEPTA ICD implant 6/14, RV dysfunction, CAD s/p CABG x 2 w/ RF MAZE 7/14 at [facility], DM type II, Chronic afib on xarelto, GI bleed 11/28/2014, and HLD.   Admitted the end of August 2016  with increased dyspnea on exertion. Later found to be in cardiogenic shock . At one point on dual pressors milrinone and norepi. Diuresed with IV lasix and transitioned to po lasix. Hospital course complicated by GI bleed and A fib RVR. Loaded on amio but later placed on toprol xl for rate control. On 9/5 had EGD with duodenal bleed with clip applied. He was discharged 9/12 with D/C weight 203 pounds.   Admitted late December 2016 with increased dyspnea. Diuresed with IV lasix and transitioned to po lasix 80 mg twice a day. Chronic A fib and continued on amio 200 mg twice a day. Discharge weight was 205 pounds.   Admitted 10/9 through 01/12/16 with marked volume overload. Diuresed with IV lasix and milrinone. D/C weight 209 pounds. He was provided medications at discharge.   Today he returns for HF follow up. Last week he missed his appointment. Says he may go to [place] in a few months. Overall feeling ok. Denies SOB/orthopnea. Weight at home 211 pounds. Says he is taking all his medications. Smoking 1/2 PPD. Followed by paramedicine weekly. Has difficulty paying for gas.   SH: Lives in low income housing. Estranged from family  Labs 05/17/2015: K 3.9 Creatinine 1.23  Labs 08/15/2015: K 4.1 Creatinine 1.41 Labs 01/12/2016: K 4.4 Creatinine 1.75   Labs 01/16/2016: K 4.7 Creatinine 1.02    ROS: All systems negative except as listed in HPI, PMH and Problem List.  SH:  Social History   Social History  . Marital status: Divorced    Spouse name:  N/A  . Number of children: 3  . Years of education: N/A   Occupational History  . disabled    Social History Main Topics  . Smoking status: Current Every Day Smoker    Packs/day: 0.50    Years: 40.00    Types: Cigarettes  . Smokeless tobacco: Never Used  . Alcohol use No  . Drug use: No  . Sexual activity: No   Other Topics Concern  . Not on file   Social History Narrative   Has an apartment with a roommate. He was living on the streets in 2013/02/08.  He reports that his father died in [place] in 02-08-2013.  He is divorced.  He is no longer estranged from his son, but still from his daughter who lives locally.  Neither of his parents, nor any siblings have any history of CAD.    FH:  Family History  Problem Relation Age of Onset  . Diabetes Mother     Past Medical History:  Diagnosis Date  . Atrial fibrillation (HCC)    RVR 10/2014  . Automatic implantable cardioverter-defibrillator in situ   . CAD (coronary artery disease) Sept 2013   s/p cardiac cath showing occlusion of small RCA with collaterals  . CHF (congestive heart failure) (HCC)    20 to 25 % EF and RV dysfunction by 07/2014 echo   . Chronic  anticoagulation    on xarelto.   . High cholesterol   . Hypertension   . Myocardial infarction 2014  . Noncompliance    homelessness contributing.   . Peripheral arterial disease (HCC)   . Type II diabetes mellitus (HCC)     Current Outpatient Prescriptions  Medication Sig Dispense Refill  . ACCU-CHEK SOFTCLIX LANCETS lancets Use for once daily testing of blood sugar 100 each 12  . albuterol (PROVENTIL HFA;VENTOLIN HFA) 108 (90 Base) MCG/ACT inhaler Inhale 2 puffs into the lungs every 6 (six) hours as needed for wheezing or shortness of breath. 1 Inhaler 2  . albuterol (PROVENTIL) (2.5 MG/3ML) 0.083% nebulizer solution Take 3 mLs (2.5 mg total) by nebulization every 6 (six) hours as needed for wheezing or shortness of breath. 150 mL 1  . amiodarone (PACERONE)  200 MG tablet Take 1 tablet (200 mg total) by mouth daily. 30 tablet 1  . atorvastatin (LIPITOR) 40 MG tablet Take 1 tablet (40 mg total) by mouth daily at 6 PM. 30 tablet 2  . Blood Glucose Monitoring Suppl (ACCU-CHEK AVIVA) device Use as instructed1 times daily before meals 1 each 0  . digoxin (LANOXIN) 0.125 MG tablet Take 0.5 tablets (0.0625 mg total) by mouth daily. 15 tablet 5  . ferrous sulfate 325 (65 FE) MG EC tablet Take 325 mg by mouth 3 (three) times daily with meals.     . gabapentin (NEURONTIN) 300 MG capsule Take 1 capsule (300 mg total) by mouth 2 (two) times daily. 60 capsule 5  . glipiZIDE (GLUCOTROL) 10 MG tablet Take 1 tablet (10 mg total) by mouth 2 (two) times daily before a meal. 60 tablet 2  . glucose blood (ACCU-CHEK AVIVA) test strip Use as instructed for 1 times daily testing of blood sugar 100 each 12  . hydrALAZINE (APRESOLINE) 25 MG tablet Take 0.5 tablets (12.5 mg total) by mouth 3 (three) times daily. 90 tablet 1  . Insulin Glargine (LANTUS) 100 UNIT/ML Solostar Pen Inject 15 Units into the skin daily at 10 pm. 15 mL 5  . Insulin Pen Needle 31G X 5 MM MISC Use as directed for once daily insulin injection 100 each 2  . isosorbide mononitrate (IMDUR) 30 MG 24 hr tablet Take 1 tablet (30 mg total) by mouth daily. 30 tablet 1  . lactulose (CHRONULAC) 10 GM/15ML solution Take 15 mLs (10 g total) by mouth daily as needed for mild constipation. 240 mL 0  . Lancet Devices (ACCU-CHEK SOFTCLIX) lancets Use as instructed for once daily testing of blood sugar 1 each 0  . meclizine (ANTIVERT) 25 MG tablet Take 25 mg by mouth 2 (two) times daily as needed for nausea.    . rivaroxaban (XARELTO) 20 MG TABS tablet Take 1 tablet (20 mg total) by mouth daily before supper. 30 tablet 5  . spironolactone (ALDACTONE) 25 MG tablet Take 25 mg by mouth daily.    Marland Kitchen. torsemide (DEMADEX) 20 MG tablet Take 4 tablets (80 mg total) by mouth 2 (two) times daily. 240 tablet 3   No current  facility-administered medications for this encounter.     There were no vitals filed for this visit. Wt Readings from Last 3 Encounters:  01/30/16 212 lb 6.4 oz (96.3 kg)  01/19/16 220 lb 8 oz (100 kg)  01/17/16 221 lb 9.6 oz (100.5 kg)      PHYSICAL EXAM: General:  Chronically ill appearing. No resp difficulty. Arrived in a wheelchair HEENT: normal Neck: supple. JVP 6-7. Carotids  2+ bilaterally; no bruits. No thyromegaly or nodule noted. Cor: PMI normal. Irregular rate & rhythm, slightly tachy. No rubs, gallops or murmurs. Lungs: Clear  Abdomen: soft, NT, ND, no HSM. No bruits or masses. +BS  Extremities: no cyanosis, clubbing, rash, R and LLE no edema.  Neuro: alert & orientedx3, cranial nerves grossly intact. Moves all 4 extremities w/o difficulty. Affect pleasant.   ASSESSMENT & PLAN: 1.Chronic Systolic Heart Failure - ICM EF 20-25%. Has [employer] ICD.  NYHA II. Volume status stable. Continue  torsemide to 80 mg BID.   Not on spiro due to compliance.  - No BB with recent cardiogenic shock.  - No ace for now with soft SBP.  Continue current dose of hydralazine/imdur/digoxin.  - He is not a candidate for advanced therapies given social situation and non compliance.  2. Chronic A fib -   Rate controlled. Continue Amio 200 mg twice a day,  Xarelto. No bleeding problems.   Needs yearly eye exams.  3. DMII- Per PCP  4. H/O GI Bleed - duodenal ulcer with clip 9/5 5. PAD- Last ABI 2015 . R EIA stent with known bilateral SFA occlusion.  Repeat ABI ok. Dr Allyson Sabal recommends yearly ABI.   5. Social  Issues-Currently resides in low income housing. Having difficulty paying for housing again. HF SW following.  6. Smoking- Current smoker. Counseled on smoking cessation.   Follow up next week. Continue Paramedicine. He is at high risk for readmit due to noncompliance.   Today we discussed taking all medications if  [place]ob [name]. He verbalized understanding.   Amy Clegg  NP-C   11:03 AM

## 2016-02-08 ENCOUNTER — Telehealth: Payer: Self-pay

## 2016-02-08 NOTE — Telephone Encounter (Signed)
This Case Manager placed call to patient to check on status. Patient indicated he was "doing good." Patient denied shortness of breath or lower extremity swelling. Inquired about medication compliance, and patient indicated he was being compliant with medications. He indicated [name] with Paramedicine program did a home visit today and set up his medications.   Inquired if patient heard from [employer] regarding independent assessment for personal care services. Patient indicated his initial assessment was scheduled for 02/12/16 at 1400. Reminded patient of his appointment on 02/09/16 at 1100 with [facility]. Patient indicated he was aware. Patient inquired about getting furniture delivered from the [employer]. Informed patient that this Case Manager would have to call Clinton Quant, [facility] Case Manager [phone]) to determine status of referral. Call placed to [facility]; unable to reach. Voicemail left requesting return call.

## 2016-02-09 ENCOUNTER — Ambulatory Visit (HOSPITAL_COMMUNITY)
Admission: RE | Admit: 2016-02-09 | Discharge: 2016-02-09 | Disposition: A | Discharge status: Home / Self Care | Payer: Medicare Other | Source: Ambulatory Visit | Attending: Cardiology | Admitting: Cardiology

## 2016-02-09 VITALS — BP 118/94 | HR 107 | Wt 220.0 lb

## 2016-02-09 DIAGNOSIS — I110 Hypertensive heart disease with heart failure: Secondary | ICD-10-CM | POA: Diagnosis not present

## 2016-02-09 DIAGNOSIS — Z720 Tobacco use: Secondary | ICD-10-CM

## 2016-02-09 DIAGNOSIS — E119 Type 2 diabetes mellitus without complications: Secondary | ICD-10-CM | POA: Insufficient documentation

## 2016-02-09 DIAGNOSIS — Z716 Tobacco abuse counseling: Secondary | ICD-10-CM | POA: Insufficient documentation

## 2016-02-09 DIAGNOSIS — I25811 Atherosclerosis of native coronary artery of transplanted heart without angina pectoris: Secondary | ICD-10-CM

## 2016-02-09 DIAGNOSIS — I739 Peripheral vascular disease, unspecified: Secondary | ICD-10-CM | POA: Diagnosis not present

## 2016-02-09 DIAGNOSIS — Z95810 Presence of automatic (implantable) cardiac defibrillator: Secondary | ICD-10-CM | POA: Insufficient documentation

## 2016-02-09 DIAGNOSIS — I2510 Atherosclerotic heart disease of native coronary artery without angina pectoris: Secondary | ICD-10-CM | POA: Diagnosis not present

## 2016-02-09 DIAGNOSIS — Z590 Homelessness: Secondary | ICD-10-CM | POA: Diagnosis not present

## 2016-02-09 DIAGNOSIS — Z7902 Long term (current) use of antithrombotics/antiplatelets: Secondary | ICD-10-CM | POA: Insufficient documentation

## 2016-02-09 DIAGNOSIS — I252 Old myocardial infarction: Secondary | ICD-10-CM | POA: Diagnosis not present

## 2016-02-09 DIAGNOSIS — Z9119 Patient's noncompliance with other medical treatment and regimen: Secondary | ICD-10-CM | POA: Insufficient documentation

## 2016-02-09 DIAGNOSIS — Z794 Long term (current) use of insulin: Secondary | ICD-10-CM | POA: Insufficient documentation

## 2016-02-09 DIAGNOSIS — I482 Chronic atrial fibrillation: Secondary | ICD-10-CM | POA: Diagnosis not present

## 2016-02-09 DIAGNOSIS — I10 Essential (primary) hypertension: Secondary | ICD-10-CM

## 2016-02-09 DIAGNOSIS — I5042 Chronic combined systolic (congestive) and diastolic (congestive) heart failure: Secondary | ICD-10-CM | POA: Diagnosis not present

## 2016-02-09 DIAGNOSIS — E7800 Pure hypercholesterolemia, unspecified: Secondary | ICD-10-CM | POA: Insufficient documentation

## 2016-02-09 DIAGNOSIS — Z91199 Patient's noncompliance with other medical treatment and regimen due to unspecified reason: Secondary | ICD-10-CM

## 2016-02-09 LAB — BASIC METABOLIC PANEL
ANION GAP: 11 (ref 5–15)
BUN: 35 mg/dL — AB (ref 6–20)
CALCIUM: 9.1 mg/dL (ref 8.9–10.3)
CO2: 27 mmol/L (ref 22–32)
CREATININE: 1.28 mg/dL — AB (ref 0.61–1.24)
Chloride: 96 mmol/L — ABNORMAL LOW (ref 101–111)
GFR calc Af Amer: >60 mL/min (ref >60)
GLUCOSE: 191 mg/dL — AB (ref 65–99)
Potassium: 3.9 mmol/L (ref 3.5–5.1)
Sodium: 134 mmol/L — ABNORMAL LOW (ref 135–145)

## 2016-02-09 LAB — BRAIN NATRIURETIC PEPTIDE: B NATRIURETIC PEPTIDE 5: 323.1 pg/mL — AB (ref 0.0–100.0)

## 2016-02-09 NOTE — Progress Notes (Signed)
Patient ID: Jacob [name], male   DOB: [birth year], 60 y.o.   MRN: [id number]    Advanced Heart Failure Clinic Note   PCP: None  Primary HF Cardiologist: Dr Jacob [name]   HPI: Jacob [name] is a 60 year old [place] male with history of systolic HF EF 20-25% via Echo 08/03/14 s/p INCEPTA ICD implant 6/14, RV dysfunction, CAD s/p CABG x 2 w/ RF MAZE 7/14 at [facility], DM type II, Chronic afib on xarelto, GI bleed 11/28/2014, and HLD.   Admitted the end of August 2016  with increased dyspnea on exertion. Later found to be in cardiogenic shock . At one point on dual pressors milrinone and norepi. Diuresed with IV lasix and transitioned to po lasix. Hospital course complicated by GI bleed and A fib RVR. Loaded on amio but later placed on toprol xl for rate control. On 9/5 had EGD with duodenal bleed with clip applied. He was discharged 9/12 with D/C weight 203 pounds.   Admitted late December 2016 with increased dyspnea. Diuresed with IV lasix and transitioned to po lasix 80 mg twice a day. Chronic A fib and continued on amio 200 mg twice a day. Discharge weight was 205 pounds.   Admitted 10/9 through 01/12/16 with marked volume overload. Diuresed with IV lasix and milrinone. D/C weight 209 pounds. He was provided medications at discharge.   He presents today for weekly follow up.  Weight up 5 lbs from last week. Weight at home 214 up to 219  Breathing is Jacob. No SOB with ADLs. Walks around the house without difficult. Taking all medications as directed. Continues to smoke 1/2 pack per day.  Got a ticket this am for expired registration tags. He continues to consider leaving the country within the next few months.  Not sure if long term vs short.   SH: Lives in low income housing. Estranged from family  Labs 05/17/2015: K 3.9 Creatinine 1.23  Labs 08/15/2015: K 4.1 Creatinine 1.41 Labs 01/12/2016: K 4.4 Creatinine 1.75   Labs 01/16/2016: K 4.7 Creatinine 1.02    ROS: All systems negative except as listed in HPI,  PMH and Problem List.  SH:  Social History   Social History  . Marital status: Divorced    Spouse name: N/A  . Number of children: 3  . Years of education: N/A   Occupational History  . disabled    Social History Main Topics  . Smoking status: Current Every Day Smoker    Packs/day: 0.50    Years: 40.00    Types: Cigarettes  . Smokeless tobacco: Never Used  . Alcohol use No  . Drug use: No  . Sexual activity: No   Other Topics Concern  . Not on file   Social History Narrative   Has an apartment with a roommate. He was living on the streets in October 2014.  He reports that his father died in [place] in October 2014.  He is divorced.  He is no longer estranged from his son, but still from his daughter who lives locally.  Neither of his parents, nor any siblings have any history of CAD.    FH:  Family History  Problem Relation Age of Onset  . Diabetes Mother     Past Medical History:  Diagnosis Date  . Atrial fibrillation (HCC)    RVR 10/2014  . Automatic implantable cardioverter-defibrillator in situ   . CAD (coronary artery disease) Sept 2013   s/p cardiac cath showing occlusion of small RCA with  collaterals  . CHF (congestive heart failure) (HCC)    20 to 25 % EF and RV dysfunction by 07/2014 echo   . Chronic anticoagulation    on xarelto.   . High cholesterol   . Hypertension   . Myocardial infarction 2014  . Noncompliance    homelessness contributing.   . Peripheral arterial disease (HCC)   . Type II diabetes mellitus (HCC)     Current Outpatient Prescriptions  Medication Sig Dispense Refill  . ACCU-CHEK SOFTCLIX LANCETS lancets Use for once daily testing of blood sugar 100 each 12  . albuterol (PROVENTIL HFA;VENTOLIN HFA) 108 (90 Base) MCG/ACT inhaler Inhale 2 puffs into the lungs every 6 (six) hours as needed for wheezing or shortness of breath. 1 Inhaler 2  . albuterol (PROVENTIL) (2.5 MG/3ML) 0.083% nebulizer solution Take 3 mLs (2.5 mg total) by  nebulization every 6 (six) hours as needed for wheezing or shortness of breath. 150 mL 1  . amiodarone (PACERONE) 200 MG tablet Take 1 tablet (200 mg total) by mouth daily. 30 tablet 1  . atorvastatin (LIPITOR) 40 MG tablet Take 1 tablet (40 mg total) by mouth daily at 6 PM. 30 tablet 2  . Blood Glucose Monitoring Suppl (ACCU-CHEK AVIVA) device Use as instructed1 times daily before meals 1 each 0  . digoxin (LANOXIN) 0.125 MG tablet Take 0.5 tablets (0.0625 mg total) by mouth daily. 15 tablet 5  . ferrous sulfate 325 (65 FE) MG EC tablet Take 325 mg by mouth 3 (three) times daily with meals.     . gabapentin (NEURONTIN) 300 MG capsule Take 1 capsule (300 mg total) by mouth 2 (two) times daily. 60 capsule 5  . glipiZIDE (GLUCOTROL) 10 MG tablet Take 1 tablet (10 mg total) by mouth 2 (two) times daily before a meal. 60 tablet 2  . glucose blood (ACCU-CHEK AVIVA) test strip Use as instructed for 1 times daily testing of blood sugar 100 each 12  . hydrALAZINE (APRESOLINE) 25 MG tablet Take 0.5 tablets (12.5 mg total) by mouth 3 (three) times daily. 90 tablet 1  . Insulin Glargine (LANTUS) 100 UNIT/ML Solostar Pen Inject 15 Units into the skin daily at 10 pm. 15 mL 5  . Insulin Pen Needle 31G X 5 MM MISC Use as directed for once daily insulin injection 100 each 2  . isosorbide mononitrate (IMDUR) 30 MG 24 hr tablet Take 1 tablet (30 mg total) by mouth daily. 30 tablet 1  . lactulose (CHRONULAC) 10 GM/15ML solution Take 15 mLs (10 g total) by mouth daily as needed for mild constipation. 240 mL 0  . Lancet Devices (ACCU-CHEK SOFTCLIX) lancets Use as instructed for once daily testing of blood sugar 1 each 0  . meclizine (ANTIVERT) 25 MG tablet Take 25 mg by mouth 2 (two) times daily as needed for nausea.    . rivaroxaban (XARELTO) 20 MG TABS tablet Take 1 tablet (20 mg total) by mouth daily before supper. 30 tablet 5  . spironolactone (ALDACTONE) 25 MG tablet Take 25 mg by mouth daily.    Marland Kitchen torsemide  (DEMADEX) 20 MG tablet Take 4 tablets (80 mg total) by mouth 2 (two) times daily. 240 tablet 3   No current facility-administered medications for this encounter.     Vitals:   02/09/16 1143  BP: (!) 118/94  Pulse: (!) 107  SpO2: 94%  Weight: 220 lb (99.8 kg)   Wt Readings from Last 3 Encounters:  02/09/16 220 lb (99.8 kg)  02/02/16  215 lb 9.6 oz (97.8 kg)  01/30/16 212 lb 6.4 oz (96.3 kg)      PHYSICAL EXAM: General:  Chronically ill appearing. NAD Arrived in a wheelchair HEENT: Normal Neck: supple. JVP 8-9 cm. Carotids 2+ bilaterally; no bruits. No thyromegaly or nodule noted. Cor: PMI normal. Irregular rate & rhythm, slightly tachy. No M/G/R appreciated. Lungs: CTAB, normal effort.  Abdomen: soft, NT, ND, no HSM. No bruits or masses. +BS  Extremities: no cyanosis, clubbing, rash, R and LLE no edema.  Neuro: alert & orientedx3, cranial nerves grossly intact. Moves all 4 extremities w/o difficulty. Affect pleasant.   ASSESSMENT & PLAN: 1.Chronic Systolic Heart Failure - ICM EF 20-25%. Has [employer] ICD.  NYHA II.  - Volume status mildly up with 5 lbs weight gain over past 2 days.  - Continue torsemide 80 mg BID. Will have patient take 2.5 mg metolazone and 20 meq of potassium today.  - No spiro due to non-compliance.  - No BB with recent cardiogenic shock.  - No ace for now with soft SBP.  - Continue current dose of hydralazine/imdur/digoxin.  - He is not a candidate for advanced therapies given social situation and non compliance.  2. Chronic A fib -   Rate controlled. Continue Amio 200 mg twice a day,  Xarelto. No bleeding problems.   Needs yearly eye exams.  3. DMII- Per PCP  4. H/O GI Bleed - duodenal ulcer with clip 9/5 5. PAD- Last ABI 2015 . R EIA stent with known bilateral SFA occlusion.  Repeat ABI Jacob. Dr Allyson Sabal recommends yearly ABI.   5. Social  Issues-Currently resides in low income housing. Having difficulty paying for housing again. HF SW following.    6. Smoking - Continued to encouraged cessation.    Follow up 02/19/16. 1 week visits interrupted by Thanksgiving, so will see him immediately after. Continue Paramedicine. He is at high risk for readmit due to noncompliance.   Mariam Dollar Kiing Deakin PA-C   11:45 AM   Total time spent > 25 minutes. Over half that spent discussing the above.

## 2016-02-09 NOTE — Patient Instructions (Signed)
Labs today We will only contact you if something comes back abnormal or we need to make some changes. Otherwise no news is good news!   Your physician recommends that you schedule a follow-up appointment in: 1 week  Do the following things EVERYDAY: 1) Weigh yourself in the morning before breakfast. Write it down and keep it in a log. 2) Take your medicines as prescribed 3) Eat low salt foods-Limit salt (sodium) to 2000 mg per day.  4) Stay as active as you can everyday 5) Limit all fluids for the day to less than 2 liters

## 2016-02-12 ENCOUNTER — Telehealth: Payer: Self-pay

## 2016-02-12 NOTE — Telephone Encounter (Signed)
Message received from [place], [facility] requesting a call back to # [phone]. Call returned and message left requesting a call back to # [phone] or [phone].

## 2016-02-14 MED FILL — SPIRONOLACTONE 25 MG TABLET: 25 | 30 days supply | Qty: 30 | Fill #2

## 2016-02-14 MED FILL — FERROUS SULFATE 325 MG TAB: 325 (65 FE) | 30 days supply | Qty: 90 | Fill #2

## 2016-02-14 MED FILL — TORSEMIDE 20 MG TABLET: 20 | 30 days supply | Qty: 240 | Fill #2

## 2016-02-19 ENCOUNTER — Ambulatory Visit (HOSPITAL_COMMUNITY)
Admission: RE | Admit: 2016-02-19 | Discharge: 2016-02-19 | Disposition: A | Discharge status: Home / Self Care | Payer: Medicare Other | Source: Ambulatory Visit | Attending: Cardiology | Admitting: Cardiology

## 2016-02-19 ENCOUNTER — Telehealth: Payer: Self-pay

## 2016-02-19 VITALS — BP 138/78 | HR 112 | Wt 219.4 lb

## 2016-02-19 DIAGNOSIS — I481 Persistent atrial fibrillation: Secondary | ICD-10-CM | POA: Diagnosis not present

## 2016-02-19 DIAGNOSIS — Z9889 Other specified postprocedural states: Secondary | ICD-10-CM | POA: Diagnosis not present

## 2016-02-19 DIAGNOSIS — Z8674 Personal history of sudden cardiac arrest: Secondary | ICD-10-CM | POA: Diagnosis not present

## 2016-02-19 DIAGNOSIS — Z9114 Patient's other noncompliance with medication regimen: Secondary | ICD-10-CM | POA: Insufficient documentation

## 2016-02-19 DIAGNOSIS — Z794 Long term (current) use of insulin: Secondary | ICD-10-CM | POA: Insufficient documentation

## 2016-02-19 DIAGNOSIS — Z95810 Presence of automatic (implantable) cardiac defibrillator: Secondary | ICD-10-CM | POA: Diagnosis not present

## 2016-02-19 DIAGNOSIS — E1151 Type 2 diabetes mellitus with diabetic peripheral angiopathy without gangrene: Secondary | ICD-10-CM | POA: Diagnosis not present

## 2016-02-19 DIAGNOSIS — Z91199 Patient's noncompliance with other medical treatment and regimen due to unspecified reason: Secondary | ICD-10-CM

## 2016-02-19 DIAGNOSIS — F17210 Nicotine dependence, cigarettes, uncomplicated: Secondary | ICD-10-CM | POA: Insufficient documentation

## 2016-02-19 DIAGNOSIS — Z833 Family history of diabetes mellitus: Secondary | ICD-10-CM | POA: Insufficient documentation

## 2016-02-19 DIAGNOSIS — I252 Old myocardial infarction: Secondary | ICD-10-CM | POA: Insufficient documentation

## 2016-02-19 DIAGNOSIS — Z9119 Patient's noncompliance with other medical treatment and regimen: Secondary | ICD-10-CM | POA: Diagnosis not present

## 2016-02-19 DIAGNOSIS — I25811 Atherosclerosis of native coronary artery of transplanted heart without angina pectoris: Secondary | ICD-10-CM

## 2016-02-19 DIAGNOSIS — I10 Essential (primary) hypertension: Secondary | ICD-10-CM

## 2016-02-19 DIAGNOSIS — I4819 Other persistent atrial fibrillation: Secondary | ICD-10-CM

## 2016-02-19 DIAGNOSIS — I5023 Acute on chronic systolic (congestive) heart failure: Secondary | ICD-10-CM | POA: Diagnosis not present

## 2016-02-19 DIAGNOSIS — I5022 Chronic systolic (congestive) heart failure: Secondary | ICD-10-CM | POA: Diagnosis not present

## 2016-02-19 DIAGNOSIS — I70209 Unspecified atherosclerosis of native arteries of extremities, unspecified extremity: Secondary | ICD-10-CM | POA: Insufficient documentation

## 2016-02-19 DIAGNOSIS — Z720 Tobacco use: Secondary | ICD-10-CM

## 2016-02-19 DIAGNOSIS — I110 Hypertensive heart disease with heart failure: Secondary | ICD-10-CM | POA: Insufficient documentation

## 2016-02-19 DIAGNOSIS — I5042 Chronic combined systolic (congestive) and diastolic (congestive) heart failure: Secondary | ICD-10-CM

## 2016-02-19 DIAGNOSIS — I2510 Atherosclerotic heart disease of native coronary artery without angina pectoris: Secondary | ICD-10-CM | POA: Insufficient documentation

## 2016-02-19 DIAGNOSIS — Z951 Presence of aortocoronary bypass graft: Secondary | ICD-10-CM | POA: Diagnosis not present

## 2016-02-19 DIAGNOSIS — Z7901 Long term (current) use of anticoagulants: Secondary | ICD-10-CM | POA: Diagnosis not present

## 2016-02-19 DIAGNOSIS — I482 Chronic atrial fibrillation: Secondary | ICD-10-CM | POA: Insufficient documentation

## 2016-02-19 DIAGNOSIS — E7800 Pure hypercholesterolemia, unspecified: Secondary | ICD-10-CM | POA: Insufficient documentation

## 2016-02-19 LAB — BASIC METABOLIC PANEL
Anion gap: 12 (ref 5–15)
BUN: 31 mg/dL — AB (ref 6–20)
CHLORIDE: 98 mmol/L — AB (ref 101–111)
CO2: 23 mmol/L (ref 22–32)
CREATININE: 1.22 mg/dL (ref 0.61–1.24)
Calcium: 8.9 mg/dL (ref 8.9–10.3)
GFR calc Af Amer: >60 mL/min (ref >60)
GFR calc non Af Amer: >60 mL/min (ref >60)
GLUCOSE: 97 mg/dL (ref 65–99)
POTASSIUM: 4.3 mmol/L (ref 3.5–5.1)
SODIUM: 133 mmol/L — AB (ref 135–145)

## 2016-02-19 LAB — BRAIN NATRIURETIC PEPTIDE: B Natriuretic Peptide: 303.2 pg/mL — ABNORMAL HIGH (ref 0.0–100.0)

## 2016-02-19 NOTE — Progress Notes (Signed)
Patient ID: Jacob [name], male   DOB: [birth year], 60 y.o.   MRN: [id number]    Advanced Heart Failure Clinic Note   PCP: None  Primary HF Cardiologist: Dr Gala Romney   HPI: Jacob [name] is a 60 year old [place] male with history of systolic HF EF 20-25% via Echo 08/03/14 s/p INCEPTA ICD implant 6/14, RV dysfunction, CAD s/p CABG x 2 w/ RF MAZE 7/14 at [facility], DM type II, Chronic afib on xarelto, GI bleed 11/28/2014, and HLD.   Admitted the end of August 2016  with increased dyspnea on exertion. Later found to be in cardiogenic shock . At one point on dual pressors milrinone and norepi. Diuresed with IV lasix and transitioned to po lasix. Hospital course complicated by GI bleed and A fib RVR. Loaded on amio but later placed on toprol xl for rate control. On 9/5 had EGD with duodenal bleed with clip applied. He was discharged 9/12 with D/C weight 203 pounds.   Admitted late December 2016 with increased dyspnea. Diuresed with IV lasix and transitioned to po lasix 80 mg twice a day. Chronic A fib and continued on amio 200 mg twice a day. Discharge weight was 205 pounds.   Admitted 10/9 through 01/12/16 with marked volume overload. Diuresed with IV lasix and milrinone. D/C weight 209 pounds. He was provided medications at discharge.   He presents today for follow up.  Last visit weight was up so given dose of metolazone. Felt better. Today very tired.  Complaining of dizziness over past 2 days. Hasn't had much money, so not eating as much but still taking his insulin. Missed several of his doses of medications last week. States he was walking back and forth to a friends and didn't have time to take his medicines. Per Florentina Addison with paramedicine, missed 3 afternoon doses (includes pm torsemide dose) and 2 bedtime doses.  Denies SOB. Weight at home 216-219. Up 3 lbs over past several days. Denies dietary non-compliance on Thanksgiving, said he really had no plans.Taking all medications as directed. Still smoking 1/2 pack  per day. No longer planning to leave the country.   SH: Lives in low income housing. Estranged from family  Labs 05/17/2015: K 3.9 Creatinine 1.23  Labs 08/15/2015: K 4.1 Creatinine 1.41 Labs 01/12/2016: K 4.4 Creatinine 1.75   Labs 01/16/2016: K 4.7 Creatinine 1.02   ROS: All systems negative except as listed in HPI, PMH and Problem List.  SH:  Social History   Social History  . Marital status: Divorced    Spouse name: N/A  . Number of children: 3  . Years of education: N/A   Occupational History  . disabled    Social History Main Topics  . Smoking status: Current Every Day Smoker    Packs/day: 0.50    Years: 40.00    Types: Cigarettes  . Smokeless tobacco: Never Used  . Alcohol use No  . Drug use: No  . Sexual activity: No   Other Topics Concern  . Not on file   Social History Narrative   Has an apartment with a roommate. He was living on the streets in Jan 31, 2013.  He reports that his father died in [place] in 01/31/2013.  He is divorced.  He is no longer estranged from his son, but still from his daughter who lives locally.  Neither of his parents, nor any siblings have any history of CAD.    FH:  Family History  Problem Relation Age of Onset  .  Diabetes Mother     Past Medical History:  Diagnosis Date  . Atrial fibrillation (HCC)    RVR 10/2014  . Automatic implantable cardioverter-defibrillator in situ   . CAD (coronary artery disease) Sept 2013   s/p cardiac cath showing occlusion of small RCA with collaterals  . CHF (congestive heart failure) (HCC)    20 to 25 % EF and RV dysfunction by 07/2014 echo   . Chronic anticoagulation    on xarelto.   . High cholesterol   . Hypertension   . Myocardial infarction 2014  . Noncompliance    homelessness contributing.   . Peripheral arterial disease (HCC)   . Type II diabetes mellitus (HCC)     Current Outpatient Prescriptions  Medication Sig Dispense Refill  . ACCU-CHEK SOFTCLIX LANCETS lancets Use for  once daily testing of blood sugar 100 each 12  . albuterol (PROVENTIL HFA;VENTOLIN HFA) 108 (90 Base) MCG/ACT inhaler Inhale 2 puffs into the lungs every 6 (six) hours as needed for wheezing or shortness of breath. 1 Inhaler 2  . albuterol (PROVENTIL) (2.5 MG/3ML) 0.083% nebulizer solution Take 3 mLs (2.5 mg total) by nebulization every 6 (six) hours as needed for wheezing or shortness of breath. 150 mL 1  . amiodarone (PACERONE) 200 MG tablet Take 1 tablet (200 mg total) by mouth daily. 30 tablet 1  . atorvastatin (LIPITOR) 40 MG tablet Take 1 tablet (40 mg total) by mouth daily at 6 PM. 30 tablet 2  . Blood Glucose Monitoring Suppl (ACCU-CHEK AVIVA) device Use as instructed1 times daily before meals 1 each 0  . digoxin (LANOXIN) 0.125 MG tablet Take 0.5 tablets (0.0625 mg total) by mouth daily. 15 tablet 5  . ferrous sulfate 325 (65 FE) MG EC tablet Take 325 mg by mouth 3 (three) times daily with meals.     . gabapentin (NEURONTIN) 300 MG capsule Take 1 capsule (300 mg total) by mouth 2 (two) times daily. 60 capsule 5  . glipiZIDE (GLUCOTROL) 10 MG tablet Take 1 tablet (10 mg total) by mouth 2 (two) times daily before a meal. 60 tablet 2  . glucose blood (ACCU-CHEK AVIVA) test strip Use as instructed for 1 times daily testing of blood sugar 100 each 12  . hydrALAZINE (APRESOLINE) 25 MG tablet Take 0.5 tablets (12.5 mg total) by mouth 3 (three) times daily. 90 tablet 1  . Insulin Glargine (LANTUS) 100 UNIT/ML Solostar Pen Inject 15 Units into the skin daily at 10 pm. 15 mL 5  . Insulin Pen Needle 31G X 5 MM MISC Use as directed for once daily insulin injection 100 each 2  . isosorbide mononitrate (IMDUR) 30 MG 24 hr tablet Take 1 tablet (30 mg total) by mouth daily. 30 tablet 1  . lactulose (CHRONULAC) 10 GM/15ML solution Take 15 mLs (10 g total) by mouth daily as needed for mild constipation. 240 mL 0  . Lancet Devices (ACCU-CHEK SOFTCLIX) lancets Use as instructed for once daily testing of blood  sugar 1 each 0  . meclizine (ANTIVERT) 25 MG tablet Take 25 mg by mouth 2 (two) times daily as needed for nausea.    . rivaroxaban (XARELTO) 20 MG TABS tablet Take 1 tablet (20 mg total) by mouth daily before supper. 30 tablet 5  . spironolactone (ALDACTONE) 25 MG tablet Take 25 mg by mouth daily.    Marland Kitchen torsemide (DEMADEX) 20 MG tablet Take 4 tablets (80 mg total) by mouth 2 (two) times daily. 240 tablet 3  No current facility-administered medications for this encounter.     Vitals:   02/19/16 1503  BP: 138/78  Pulse: (!) 112  SpO2: 98%  Weight: 219 lb 6.4 oz (99.5 kg)   Wt Readings from Last 3 Encounters:  02/19/16 219 lb 6.4 oz (99.5 kg)  02/09/16 220 lb (99.8 kg)  02/02/16 215 lb 9.6 oz (97.8 kg)      PHYSICAL EXAM: General:  Fatigued appearing, Walked in clinic.  HEENT: Normal Neck: supple. JVP 6-7 with very mild HJR.  Carotids 2+ bilaterally; no bruits. No thyromegaly or nodule noted. Cor: PMI normal. Irregularly irregular. No M/G/R appreciated. Lungs: Clear, normal effort Abdomen: soft, NT, ND, no HSM. No bruits or masses. +BS   Extremities: no cyanosis, clubbing, rash, R and LLE no edema.  Neuro: alert & orientedx3, cranial nerves grossly intact. Moves all 4 extremities w/o difficulty. Affect pleasant.  EKG Afib 92 bpm. Rate controlled.  ASSESSMENT & PLAN: 1.Chronic Systolic Heart Failure - ICM EF 20-25%. Has [employer] ICD.  NYHA II.  - Volume status stable.  - Continue torsemide 80 mg BID. BMET today - No spiro due to non-compliance.  - No BB with recent cardiogenic shock.  - No ace for now with soft SBP.  - Continue current dose of hydralazine/imdur/digoxin.  - He is not a candidate for advanced therapies given social situation and non compliance.  2. Chronic A fib -   Rate controlled. Continue Amio 200 mg twice a day,  Xarelto. No bleeding problems.   Needs yearly eye exams.  3. DMII- Per PCP  4. H/O GI Bleed - duodenal ulcer with clip 9/5 5. PAD-  Last ABI 2015 . R EIA stent with known bilateral SFA occlusion.  Repeat ABI ok. Dr Allyson SabalBerry recommends yearly ABI.   5. Social  Issues - Resides in low income housing.  HFSW following.  6. Smoking - Continues to smoke. Encouraged cessation.  7. DMII - Has been taking same dose of insulin despite severely limited diet.  Have asked him to [facility] with [facility].    Follow up 02/19/16. 1 week visits interrupted by Thanksgiving, so will see him immediately after. Continue Paramedicine. He is at high risk for readmit due to noncompliance.   Mariam DollarMichael Andrew Tillery PA-C   3:25 PM

## 2016-02-19 NOTE — Patient Instructions (Signed)
Labs today We will only contact you if something comes back abnormal or we need to make some changes. Otherwise no news is good news!  Your physician recommends that you schedule a follow-up appointment in: as scheduled next week with Amy Clegg,NP   Do the following things EVERYDAY: 1) Weigh yourself in the morning before breakfast. Write it down and keep it in a log. 2) Take your medicines as prescribed 3) Eat low salt foods-Limit salt (sodium) to 2000 mg per day.  4) Stay as active as you can everyday 5) Limit all fluids for the day to less than 2 liters

## 2016-02-19 NOTE — Telephone Encounter (Signed)
Call returned to [facility], [facility]. She was inquiring about the furniture delivery for the patient.  Informed her that at this time, as per Dennison Nancy, Director - [facility], the [facility] will not be able to pay for the furniture delivery as it is not medically necessary.  Ms Aurther Loft noted that the patient has stated that he isn't able to afford the delivery charge.   Call placed to the patient # [phone] (H) to discuss the furniture delivery. A HIPAA compliant voicemail message was left requesting a call back to # [phone] or [phone].

## 2016-02-19 NOTE — Addendum Note (Signed)
Encounter addended by: Chyrl Civatte, RN on: 02/19/2016  3:52 PM<BR>    Actions taken: Visit diagnoses modified, Order list changed, Diagnosis association updated

## 2016-02-20 ENCOUNTER — Telehealth: Payer: Self-pay | Admitting: Licensed Clinical Social Worker

## 2016-02-20 NOTE — Telephone Encounter (Signed)
SW Intern called pt to follow up. Pt indicated he did not go to DSS  but that he had made an appointment and would be going tomorrow. Pt also indicated he did not pick up his medication but that he would be getting it tomorrow also. SW Intern will continue to follow up as needed. SW Intern [address] [place], [place] [phone]

## 2016-02-20 NOTE — Progress Notes (Signed)
CSW met with patient and [name] paramedic in the clinic. Patient states his food stamps were cancelled and unsure why. CSW suggested patient go to DSS and explore re application if necessary. Patient also mentioned infestation of roaches in his apartment. Patient requested assistance to contact landlord to request additional extermination as he currently receives monthly extermination. CSW contacted landlord who states he will exterminate additional time this month but also mentioned that patient needs to keep kitchen area clean and free of crumbs on the counters. CSW informed patient of conversation and follow up planned. Patient verbalizes understanding and will contact CSW if further needs develop. CSW continues to coordinate with paramedicine program. Raquel Sarna, LCSW [phone]

## 2016-02-20 NOTE — Addendum Note (Signed)
Encounter addended by: Marcy Siren, LCSW on: 02/20/2016  7:54 AM<BR>    Actions taken: Sign clinical note

## 2016-02-21 ENCOUNTER — Telehealth: Payer: Self-pay

## 2016-02-21 NOTE — Telephone Encounter (Signed)
Call received from Clinton Quant , [facility] who stated that she has been able to arrange for furniture delivery from [facility] for the patient on 03/20/16 @ 0915.  She said that she will meet the patient at the facility and he can pick out what he needs and it will be delivered to his home as he lives on the first floor. She also noted that the patient told her that the [facility] assessment was done but she does not know the outcome.    Call placed to the patient to check on his status and to discuss scheduling his next appointment with Dr Venetia Night. He said that he is doing " all right:" and he was at work at the time of the call. He noted that the [facility] assessment was done and he is waiting for a call back with the hours that he may receive if he is approved. He said that he has all of his medications and has been taking them as ordered. He noted that he needs to pick up some medications today or tomorrow. He said that he has not run out of any medication yet and this CM stressed the importance of not running out of any of his medications. He stated that he understood but seemed anxious to get off of the phone. He was agreeable to scheduling an appointment with Dr Venetia Night and an appointment was scheduled for 03/01/16 @ 1400.  He was very appreciative of the call and the appointment.

## 2016-02-23 ENCOUNTER — Telehealth: Payer: Self-pay

## 2016-02-23 ENCOUNTER — Encounter (HOSPITAL_COMMUNITY): Payer: Medicare Other

## 2016-02-23 NOTE — Telephone Encounter (Signed)
This Case Manager placed call to Jacob [name], [facility] Case Manager, to inform her of patient's upcoming appointment on 03/01/16 at 1400 with Dr. Venetia Night. Call placed to #[phone]; however, unable to reach. Voicemail left requesting return call.

## 2016-02-26 ENCOUNTER — Inpatient Hospital Stay (HOSPITAL_COMMUNITY): Admission: RE | Admit: 2016-02-26 | Payer: Medicare Other | Source: Ambulatory Visit

## 2016-02-26 ENCOUNTER — Telehealth: Payer: Self-pay

## 2016-02-26 MED FILL — LANTUS SOLOSTAR 100 UNITS/M: 100 | 25 days supply | Qty: 3 | Fill #2

## 2016-02-26 NOTE — Telephone Encounter (Signed)
Met with the patient when he was in the clinic today to pick up his medications at [facility] [name]. He said that he ran out of his pills and insulin and hasn't had insulin for 4 days. He noted that Joellen Jersey, paramedicine program is coming to see him tomorrow and he told her that he didn't have his medications. Reminded him of the importance of planning when to order his re-fills so that doesn't run out of medications.

## 2016-02-27 ENCOUNTER — Other Ambulatory Visit (HOSPITAL_COMMUNITY): Payer: Self-pay | Admitting: Adult Health

## 2016-02-27 ENCOUNTER — Other Ambulatory Visit: Payer: Self-pay | Admitting: Family Medicine

## 2016-02-27 DIAGNOSIS — I25811 Atherosclerosis of native coronary artery of transplanted heart without angina pectoris: Secondary | ICD-10-CM

## 2016-02-29 ENCOUNTER — Telehealth: Payer: Self-pay

## 2016-02-29 NOTE — Telephone Encounter (Signed)
This Case Manager received return call from patient. Inquired about his status, and patient indicated he was "good." Inquired if patient picked up his insulin, and patient indicated he picked up his insulin earlier this week but had to pick up three other medications today at pharmacy. Encouraged patient to pick up medications today and stressed importance of adhering to his medication regimen. Patient verbalized understanding. Reminded patient of his Transitional Care appointment on 03/01/16 at 1400, and patient verbalized understanding. Patient indicated he would be at his upcoming appointment.  Inquired if patient found out if he is eligible for personal care services. Patient indicated he was informed he has been approved for 37 hours/month of personal care services; however, patient indicated he is uncertain about next step. Inquired if patient was given a list of home aide providers as they typically want patient's top choices, and patient indicated he was not provided list.   This Case Manager placed call to [employer] [facility] [place] to follow-up. Spoke with Tammy who indicated [facility] was contacted on 02/14/16 to determine if they were able to provide personal care services; however, they are still waiting to hear if agency is able to provide needed services. Tammy also indicated patient called [employer] on 02/28/16 to check on status, and a list of home aide agencies was mailed to patient on 02/28/16. Patient will need to make additional choices and inform [employer] of choices. Call placed to patient to provide update. Patient verbalized understanding.

## 2016-02-29 NOTE — Telephone Encounter (Signed)
This Case Manager placed call to patient to check status and to remind him of upcoming appointment on 03/01/16 at 1400 with Dr. Venetia Night. Call placed to #[phone]; however, unable to reach patient. HIPPA compliant voicemail left requesting return call.

## 2016-03-01 ENCOUNTER — Inpatient Hospital Stay: Payer: Medicare Other | Admitting: Family Medicine

## 2016-03-08 ENCOUNTER — Telehealth: Payer: Self-pay

## 2016-03-08 NOTE — Telephone Encounter (Signed)
This Case Manager placed call to patient to check status and discuss rescheduling appointment with Dr. Venetia Night as patient missed appointment on 03/01/16. Call placed to #[phone]; however, unable to reach patient. HIPPA compliant voicemail left requesting return call.

## 2016-03-08 NOTE — Telephone Encounter (Signed)
This Case Manager received return call from patient. Inquired about patient's status. Patient denied shortness of breath or any health concerns at this time. Inquired if patient picked up medication refills, and patient indicated he "forgot." Stressed importance of medication compliance and encouraged patient to pick up medications. Inquired if patient checking blood glucose and keeping record of blood glucose readings. Patient indicated he was keeping a record of his blood glucose readings and indicated his last blood glucose was "130." Patient also indicated he was weighing himself daily and keeping record of his daily weights. Reminded patient he needed to reschedule appointment with Dr. Venetia Night as he missed his appointment on 03/01/16. Patient verbalized understanding, and appointment scheduled for 03/14/16 at 1015 with [facility]. Patient appreciative of appointment. Also informed patient he should reschedule appointment with [facility] as patient cancelled appointment on 02/26/16. Patient verbalized understanding and indicated he had phone number to call and schedule appointment. No additional needs identified.

## 2016-03-12 MED FILL — glipiZIDE 10 MG TABS: 10 | 30 days supply | Qty: 60 | Fill #0

## 2016-03-12 MED FILL — ATORVASTATIN 40 MG TABLET: 40 | 30 days supply | Qty: 30 | Fill #0

## 2016-03-13 ENCOUNTER — Telehealth: Payer: Self-pay

## 2016-03-13 ENCOUNTER — Telehealth (HOSPITAL_COMMUNITY): Payer: Self-pay | Admitting: *Deleted

## 2016-03-13 DIAGNOSIS — I25811 Atherosclerosis of native coronary artery of transplanted heart without angina pectoris: Secondary | ICD-10-CM

## 2016-03-13 DIAGNOSIS — I482 Chronic atrial fibrillation, unspecified: Secondary | ICD-10-CM

## 2016-03-13 MED ORDER — HYDRALAZINE HCL 25 MG PO TABS: 12.5000 mg | ORAL_TABLET | Freq: Three times a day (TID) | ORAL | 3 refills | Status: DC

## 2016-03-13 MED ORDER — ATORVASTATIN CALCIUM 40 MG PO TABS: 40.0000 mg | ORAL_TABLET | Freq: Every day | ORAL | 1 refills | Status: DC

## 2016-03-13 MED ORDER — ISOSORBIDE MONONITRATE ER 30 MG PO TB24: 30.0000 mg | ORAL_TABLET | Freq: Every day | ORAL | 3 refills | Status: DC

## 2016-03-13 MED ORDER — DIGOXIN 125 MCG PO TABS: 0.0625 mg | ORAL_TABLET | Freq: Every day | ORAL | 3 refills | Status: DC

## 2016-03-13 MED ORDER — RIVAROXABAN 20 MG PO TABS: 20.0000 mg | ORAL_TABLET | Freq: Every day | ORAL | 3 refills | Status: DC

## 2016-03-13 MED ORDER — AMIODARONE HCL 200 MG PO TABS: 200.0000 mg | ORAL_TABLET | Freq: Every day | ORAL | 3 refills | Status: DC

## 2016-03-13 MED FILL — XARELTO 20 MG TABLET: 20 | 30 days supply | Qty: 30 | Fill #2

## 2016-03-13 MED FILL — ISOSORBIDE MN ER 30 MG TAB: 30 | 30 days supply | Qty: 30 | Fill #0

## 2016-03-13 MED FILL — AMIODARONE HCL 200 MG TAB: 200 | 30 days supply | Qty: 30 | Fill #0

## 2016-03-13 MED FILL — hydrALAZINE HCL 25 MG TABS: 25 | 30 days supply | Qty: 45 | Fill #0

## 2016-03-13 MED FILL — ULTICARE PEN NDL 4MM 32G: 32G X 4 MM | 25 days supply | Qty: 100 | Fill #1

## 2016-03-13 MED FILL — DIGITEK 125 MCG TABLET: 125 | 30 days supply | Qty: 15 | Fill #2

## 2016-03-13 MED FILL — GABAPENTIN 300 MG CAPSULE: 300 | 30 days supply | Qty: 60 | Fill #1

## 2016-03-13 NOTE — Telephone Encounter (Signed)
This Case Manager received call from [name] with the Paramedicine program who indicated she is at patient's home, and patient is missing several medications. Patient told her that he went to pick up medications from pharmacy and was told he could not get medications because he missed his last appointment. [name] indicated patient needed refills of the following medications: amiodarone, atorvastatin, digoxin, gabapentin, glipizide, hydralazine, lantus, isosorbide mononitrate, insulin needles, test strips, lancets, xarelto. This Case Manager spoke with Pharmacist to clarify. Franky Macho, Pharmacist at [facility] and [facility] pharmacy, indicated he has contacted patient's Cardiologist indicating patient needs refills of the following medications: amiodarone, digoxin, hydralazine, isosorbide mononitrate, xarelto and was told patient needs an appointment. He indicated he would send refill request again to Cardiologist. Franky Macho also indicated he would refill atorvastatin, gabapentin, glipizide, test strips, insulin needles, lancets because patient has available refills on his scripts. He indicated patient picked up Lantus on 02/26/16, and he cannot refill medication until 03/27/16.  This Case Manager placed call to [name] with [facility] Paramed[facility] program to provide update. She indicated she will contact the [facility] and inform them of patients need for refills of amiodarone, digoxin, hydralazine, isosorbide mononitrate, xarelto, and she will also attempt to reschedule his appointment with the [facility] since patient missed his last appointment. Informed her of the medications that will be refilled and that it is too early to refill Lantus. [name] appreciative of information and indicated she would update patient. She also indicated that patient is aware of his appointment on 03/14/16 at 1015, and he informed her he will be at his appointment. No additional needs identified at this  time.

## 2016-03-13 NOTE — Telephone Encounter (Signed)
[name] called reporting patient needed refills on all his medications.  She also said she would call to set him up a follow up appointment with [place].    Refills have been sent to the [facility] pharmacy as requested from patient.

## 2016-03-14 ENCOUNTER — Telehealth: Payer: Self-pay

## 2016-03-14 ENCOUNTER — Ambulatory Visit: Payer: Medicare Other | Attending: Family Medicine | Admitting: Family Medicine

## 2016-03-14 ENCOUNTER — Encounter: Payer: Self-pay | Admitting: Family Medicine

## 2016-03-14 VITALS — BP 88/55 | HR 90 | Temp 98.4°F | Ht 65.0 in | Wt 224.2 lb

## 2016-03-14 DIAGNOSIS — I4891 Unspecified atrial fibrillation: Secondary | ICD-10-CM | POA: Insufficient documentation

## 2016-03-14 DIAGNOSIS — Z720 Tobacco use: Secondary | ICD-10-CM | POA: Diagnosis not present

## 2016-03-14 DIAGNOSIS — I5042 Chronic combined systolic (congestive) and diastolic (congestive) heart failure: Secondary | ICD-10-CM | POA: Diagnosis not present

## 2016-03-14 DIAGNOSIS — E1151 Type 2 diabetes mellitus with diabetic peripheral angiopathy without gangrene: Secondary | ICD-10-CM | POA: Diagnosis not present

## 2016-03-14 DIAGNOSIS — I10 Essential (primary) hypertension: Secondary | ICD-10-CM

## 2016-03-14 DIAGNOSIS — I255 Ischemic cardiomyopathy: Secondary | ICD-10-CM

## 2016-03-14 DIAGNOSIS — I110 Hypertensive heart disease with heart failure: Secondary | ICD-10-CM | POA: Insufficient documentation

## 2016-03-14 DIAGNOSIS — Z7901 Long term (current) use of anticoagulants: Secondary | ICD-10-CM | POA: Diagnosis not present

## 2016-03-14 DIAGNOSIS — Z79899 Other long term (current) drug therapy: Secondary | ICD-10-CM

## 2016-03-14 DIAGNOSIS — I5022 Chronic systolic (congestive) heart failure: Secondary | ICD-10-CM | POA: Insufficient documentation

## 2016-03-14 LAB — POCT GLYCOSYLATED HEMOGLOBIN (HGB A1C): HEMOGLOBIN A1C: 7.2

## 2016-03-14 LAB — GLUCOSE, POCT (MANUAL RESULT ENTRY): POC Glucose: 115 mg/dl — AB (ref 70–99)

## 2016-03-14 NOTE — Telephone Encounter (Signed)
Pt's son will try to have pt come back today for lab work.

## 2016-03-14 NOTE — Progress Notes (Addendum)
Subjective:    Patient ID: Jacob [name], male    DOB: 1955-08-11, 60 y.o.   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 Would like a refill of all his medications.  He currently has a IT trainercaseworker from [employer]; would like to have PCS services. He continues to smoke and is not willing to quit yet. Currently followed by the heart failure clinic.  Past Medical History:  Diagnosis Date  . Atrial fibrillation (HCC)    RVR 10/2014  . Automatic implantable cardioverter-defibrillator in situ   . CAD (coronary artery disease) Sept 2013   s/p cardiac cath showing occlusion of small RCA with collaterals  . CHF (congestive heart failure) (HCC)    20 to 25 % EF and RV dysfunction by 07/2014 echo   . Chronic anticoagulation    on xarelto.   . High cholesterol   . Hypertension   . Myocardial infarction 2014  . Noncompliance    homelessness contributing.   . Peripheral arterial disease (HCC)   . Type II diabetes mellitus (HCC)     Past Surgical History:  Procedure Laterality Date  . CARDIAC CATHETERIZATION  09/2012  . CORONARY ANGIOPLASTY WITH STENT PLACEMENT  11/2011   "1"  . CORONARY ARTERY BYPASS GRAFT  09/2012   2 vessels per patient [name]([place])   . ESOPHAGOGASTRODUODENOSCOPY N/A 11/28/2014   Procedure: ESOPHAGOGASTRODUODENOSCOPY (EGD);  Surgeon: Beverley FiedlerJay M Pyrtle, MD;  [facility];  Service: Endoscopy;  Laterality: N/A;  . ILIAC ARTERY STENT Right 08/30/2013  . IMPLANTABLE CARDIOVERTER DEFIBRILLATOR IMPLANT     [place] in 07/2012; [employer]  . LEFT AND RIGHT HEART CATHETERIZATION  WITH CORONARY ANGIOGRAM N/A 12/02/2011   Procedure: LEFT AND RIGHT HEART CATHETERIZATION WITH CORONARY ANGIOGRAM;  Surgeon: Kathleene Hazelhristopher D McAlhany, MD;  [facility];  Service: Cardiovascular;  Laterality: N/A;  . LOWER EXTREMITY ANGIOGRAM N/A 08/30/2013   Procedure: LOWER EXTREMITY ANGIOGRAM;  Surgeon: Runell GessJonathan J Berry, MD;  [facility];  Service: Cardiovascular;  Laterality: N/A;  . LOWER EXTREMITY ANGIOGRAM N/A 12/02/2013   Procedure: LOWER EXTREMITY ANGIOGRAM;  Surgeon: Runell GessJonathan J Berry, MD;  [facility];  Service: Cardiovascular;  Laterality: N/A;    Allergies  Allergen Reactions  . Tylenol [Acetaminophen] Itching  . Tape Itching    Paper tape please.      Review of Systems  Constitutional: Negative for activity change and appetite change.  HENT: Negative for sinus pressure and sore throat.   Eyes: Negative for visual disturbance.  Respiratory: Negative for cough, chest tightness and shortness of breath.   Cardiovascular: Negative for chest pain and leg swelling.  Gastrointestinal: Negative for abdominal distention, abdominal pain, constipation and diarrhea.  Endocrine: Negative.   Genitourinary: Negative for dysuria.  Musculoskeletal: Negative for joint swelling and myalgias.  Skin: Negative for rash.  Allergic/Immunologic: Negative.   Neurological: Negative for weakness, light-headedness and numbness.  Psychiatric/Behavioral: Negative for dysphoric mood and suicidal ideas.       Objective: Vitals:   03/14/16 1059  BP: (!) 88/55  Pulse: 90  Temp: 98.4 F (36.9 C)  TempSrc: Oral  SpO2:  96%  Weight: 224 lb 3.2 oz (101.7 kg)  Height: 5\' 5"  (1.651 m)    Wt Readings from Last 3 Encounters:  03/14/16 224 lb 3.2 oz (101.7 kg)  02/19/16 219 lb 6.4 oz (99.5 kg)  02/09/16 220 lb (99.8 kg)      Physical Exam  Constitutional: He is oriented to person, place, and time. He appears well-developed and well-nourished.  Neck: No JVD present.    Cardiovascular: Normal rate, normal heart sounds and intact distal pulses.   No murmur heard. Pulmonary/Chest: Effort normal and breath sounds normal. He has no wheezes. He has no rales. He exhibits no tenderness.  Abdominal: Soft. Bowel sounds are normal. He exhibits no distension and no mass. There is no tenderness.  Musculoskeletal: Normal range of motion.  Neurological: He is alert and oriented to person, place, and time.  Skin: Skin is warm and dry.  Psychiatric: He has a normal mood and affect.          Assessment & Plan:  1. DM (diabetes mellitus), type 2 with peripheral vascular complications (HCC) Controlled with A1c of 7.2 which has improved from 11.0 previously. He has been commended on improvement Continue ADA diet - Glucose (CBG)  2. Chronic systolic CHF (congestive heart failure), NYHA class 3 (HCC)  EF of 25-30% from 2-D echo of 10/2015 Euvolemic Weight is up 4 pounds from the last visit 1 month ago  Continue para medicine program, daily weight checks, low-sodium diet Continue torsemide, isosorbide and hydralazine Has an upcoming appointment with cardiology  3. Atrial fibrillation with controlled ventricular response (HCC) Currently in sinus rhythm Currently on anticoagulation with Xarelto and rate control with amiodarone We'll send off thyroid function (due to high-risk medication use)  4. Hypertension Blood pressure is on the soft side He is asymptomatic.  5. Tobacco abuse Spent 3 minutes counseling on cessation and he is not ready to quit at this time.

## 2016-03-14 NOTE — Progress Notes (Signed)
Med refills

## 2016-03-14 NOTE — Patient Instructions (Signed)
Diabetes Mellitus and Food It is important for you to manage your blood sugar (glucose) level. Your blood glucose level can be greatly affected by what you eat. Eating healthier foods in the appropriate amounts throughout the day at about the same time each day will help you control your blood glucose level. It can also help slow or prevent worsening of your diabetes mellitus. Healthy eating may even help you improve the level of your blood pressure and reach or maintain a healthy weight. General recommendations for healthful eating and cooking habits include:  Eating meals and snacks regularly. Avoid going long periods of time without eating to lose weight.  Eating a diet that consists mainly of plant-based foods, such as fruits, vegetables, nuts, legumes, and whole grains.  Using low-heat cooking methods, such as baking, instead of high-heat cooking methods, such as deep frying.  Work with your dietitian to make sure you understand how to use the Nutrition Facts information on food labels. How can food affect me? Carbohydrates Carbohydrates affect your blood glucose level more than any other type of food. Your dietitian will help you determine how many carbohydrates to eat at each meal and teach you how to count carbohydrates. Counting carbohydrates is important to keep your blood glucose at a healthy level, especially if you are using insulin or taking certain medicines for diabetes mellitus. Alcohol Alcohol can cause sudden decreases in blood glucose (hypoglycemia), especially if you use insulin or take certain medicines for diabetes mellitus. Hypoglycemia can be a life-threatening condition. Symptoms of hypoglycemia (sleepiness, dizziness, and disorientation) are similar to symptoms of having too much alcohol. If your health care provider has given you approval to drink alcohol, do so in moderation and use the following guidelines:  Women should not have more than one drink per day, and men  should not have more than two drinks per day. One drink is equal to: ? 12 oz of beer. ? 5 oz of wine. ? 1 oz of hard liquor.  Do not drink on an empty stomach.  Keep yourself hydrated. Have water, diet soda, or unsweetened iced tea.  Regular soda, juice, and other mixers might contain a lot of carbohydrates and should be counted.  What foods are not recommended? As you make food choices, it is important to remember that all foods are not the same. Some foods have fewer nutrients per serving than other foods, even though they might have the same number of calories or carbohydrates. It is difficult to get your body what it needs when you eat foods with fewer nutrients. Examples of foods that you should avoid that are high in calories and carbohydrates but low in nutrients include:  Trans fats (most processed foods list trans fats on the Nutrition Facts label).  Regular soda.  Juice.  Candy.  Sweets, such as cake, pie, doughnuts, and cookies.  Fried foods.  What foods can I eat? Eat nutrient-rich foods, which will nourish your body and keep you healthy. The food you should eat also will depend on several factors, including:  The calories you need.  The medicines you take.  Your weight.  Your blood glucose level.  Your blood pressure level.  Your cholesterol level.  You should eat a variety of foods, including:  Protein. ? Lean cuts of meat. ? Proteins low in saturated fats, such as fish, egg whites, and beans. Avoid processed meats.  Fruits and vegetables. ? Fruits and vegetables that may help control blood glucose levels, such as apples,   mangoes, and yams.  Dairy products. ? Choose fat-free or low-fat dairy products, such as milk, yogurt, and cheese.  Grains, bread, pasta, and rice. ? Choose whole grain products, such as multigrain bread, whole oats, and brown rice. These foods may help control blood pressure.  Fats. ? Foods containing healthful fats, such as  nuts, avocado, olive oil, canola oil, and fish.  Does everyone with diabetes mellitus have the same meal plan? Because every person with diabetes mellitus is different, there is not one meal plan that works for everyone. It is very important that you meet with a dietitian who will help you create a meal plan that is just right for you. This information is not intended to replace advice given to you by your health care provider. Make sure you discuss any questions you have with your health care provider. Document Released: 12/06/2004 Document Revised: 08/17/2015 Document Reviewed: 02/05/2013 Elsevier Interactive Patient Education  2017 Elsevier Inc.  

## 2016-03-15 ENCOUNTER — Telehealth: Payer: Self-pay

## 2016-03-15 NOTE — Telephone Encounter (Signed)
This Case Manager placed call to patient to discuss patient's need to get labwork done and to pick up medications from [facility] pharmacy as patient left clinic yesterday and did not pick up medications or get labwork done. Call placed to #[phone]; unable to reach patient. HIPPA compliant voicemail left requesting return call.

## 2016-03-20 ENCOUNTER — Telehealth: Payer: Self-pay

## 2016-03-20 NOTE — Telephone Encounter (Addendum)
Message received from [facility] with [employer] requesting a call back.  Call returned to [facility] who inquired if the patient has picked up his medications yet at [facility]. She also noted that the patient was supposed to meet with her today at 0930. Informed her that as per Carlis Stable, [facility] Tech, the patient has not picked up his medications. Provided [name] with the phone # [phone]  for Clinton Quant, [facility] who works closely with the patient.

## 2016-03-21 ENCOUNTER — Telehealth: Payer: Self-pay

## 2016-03-21 ENCOUNTER — Other Ambulatory Visit: Payer: Medicare Other

## 2016-03-21 NOTE — Telephone Encounter (Signed)
This Case Manager placed call to patient to determine if patient picked up his medications. Patient indicated he picked up his medications yesterday, and [name] with Paramedicine program set up his pill box yesterday. Patient indicated he needed a refill of his test strips. Informed patient he had refills available on his script, and patient indicated he would contact pharmacy at [facility] and [facility] to request refill. In addition, this Case Manager informed patient that Dr. Venetia Night ordered labwork for him that needs to be drawn. Patient indicated he could come to the clinic at 1430 for labwork. Lab appointment scheduled. No additional needs identified.

## 2016-03-22 MED FILL — LANTUS SOLOSTAR 100 UNITS/M: 100 | 25 days supply | Qty: 3 | Fill #3

## 2016-03-26 ENCOUNTER — Telehealth (HOSPITAL_COMMUNITY): Payer: Self-pay | Admitting: Internal Medicine

## 2016-03-26 NOTE — Telephone Encounter (Signed)
Spoke with pt he is ready to start with Cardiac Rehab, sent msg to have pt set up for class.... KJ

## 2016-03-28 ENCOUNTER — Ambulatory Visit (HOSPITAL_COMMUNITY)
Admission: RE | Admit: 2016-03-28 | Discharge: 2016-03-28 | Disposition: A | Discharge status: Home / Self Care | Payer: Medicare Other | Source: Ambulatory Visit | Attending: Internal Medicine | Admitting: Internal Medicine

## 2016-03-28 ENCOUNTER — Encounter (HOSPITAL_COMMUNITY): Payer: Self-pay

## 2016-03-28 VITALS — BP 180/94 | HR 100 | Wt 223.0 lb

## 2016-03-28 DIAGNOSIS — Z9889 Other specified postprocedural states: Secondary | ICD-10-CM | POA: Diagnosis not present

## 2016-03-28 DIAGNOSIS — I110 Hypertensive heart disease with heart failure: Secondary | ICD-10-CM | POA: Insufficient documentation

## 2016-03-28 DIAGNOSIS — E7800 Pure hypercholesterolemia, unspecified: Secondary | ICD-10-CM | POA: Diagnosis not present

## 2016-03-28 DIAGNOSIS — Z95810 Presence of automatic (implantable) cardiac defibrillator: Secondary | ICD-10-CM | POA: Insufficient documentation

## 2016-03-28 DIAGNOSIS — F17210 Nicotine dependence, cigarettes, uncomplicated: Secondary | ICD-10-CM | POA: Diagnosis not present

## 2016-03-28 DIAGNOSIS — Z720 Tobacco use: Secondary | ICD-10-CM | POA: Diagnosis not present

## 2016-03-28 DIAGNOSIS — I5042 Chronic combined systolic (congestive) and diastolic (congestive) heart failure: Secondary | ICD-10-CM | POA: Diagnosis not present

## 2016-03-28 DIAGNOSIS — Z9119 Patient's noncompliance with other medical treatment and regimen: Secondary | ICD-10-CM | POA: Diagnosis not present

## 2016-03-28 DIAGNOSIS — E1151 Type 2 diabetes mellitus with diabetic peripheral angiopathy without gangrene: Secondary | ICD-10-CM | POA: Diagnosis not present

## 2016-03-28 DIAGNOSIS — Z951 Presence of aortocoronary bypass graft: Secondary | ICD-10-CM | POA: Diagnosis not present

## 2016-03-28 DIAGNOSIS — Z833 Family history of diabetes mellitus: Secondary | ICD-10-CM | POA: Insufficient documentation

## 2016-03-28 DIAGNOSIS — I252 Old myocardial infarction: Secondary | ICD-10-CM | POA: Diagnosis not present

## 2016-03-28 DIAGNOSIS — Z794 Long term (current) use of insulin: Secondary | ICD-10-CM | POA: Insufficient documentation

## 2016-03-28 DIAGNOSIS — I2510 Atherosclerotic heart disease of native coronary artery without angina pectoris: Secondary | ICD-10-CM | POA: Insufficient documentation

## 2016-03-28 DIAGNOSIS — I70209 Unspecified atherosclerosis of native arteries of extremities, unspecified extremity: Secondary | ICD-10-CM | POA: Diagnosis not present

## 2016-03-28 DIAGNOSIS — I5022 Chronic systolic (congestive) heart failure: Secondary | ICD-10-CM | POA: Insufficient documentation

## 2016-03-28 DIAGNOSIS — I482 Chronic atrial fibrillation: Secondary | ICD-10-CM | POA: Diagnosis not present

## 2016-03-28 DIAGNOSIS — I10 Essential (primary) hypertension: Secondary | ICD-10-CM | POA: Diagnosis not present

## 2016-03-28 DIAGNOSIS — Z7901 Long term (current) use of anticoagulants: Secondary | ICD-10-CM | POA: Insufficient documentation

## 2016-03-28 LAB — BASIC METABOLIC PANEL
ANION GAP: 11 (ref 5–15)
BUN: 19 mg/dL (ref 6–20)
CALCIUM: 8.6 mg/dL — AB (ref 8.9–10.3)
CO2: 26 mmol/L (ref 22–32)
CREATININE: 1.17 mg/dL (ref 0.61–1.24)
Chloride: 97 mmol/L — ABNORMAL LOW (ref 101–111)
Glucose, Bld: 211 mg/dL — ABNORMAL HIGH (ref 65–99)
Potassium: 4.2 mmol/L (ref 3.5–5.1)
Sodium: 134 mmol/L — ABNORMAL LOW (ref 135–145)

## 2016-03-28 NOTE — Progress Notes (Signed)
Patient ID: Jacob [name], male   DOB: April 01, 1955, 61 y.o.   MRN: [id number]    Advanced Heart Failure Clinic Note   PCP: None  Primary HF Cardiologist: Dr Gala Romney   HPI: Jacob [name] is a 61 year old [place] male with history of systolic HF EF 20-25% via Echo 08/03/14 s/p INCEPTA ICD implant 6/14, RV dysfunction, CAD s/p CABG x 2 w/ RF MAZE 7/14 at [facility], DM type II, Chronic afib on xarelto, GI bleed 11/28/2014, and HLD.   Admitted the end of August 2016  with increased dyspnea on exertion. Later found to be in cardiogenic shock . At one point on dual pressors milrinone and norepi. Diuresed with IV lasix and transitioned to po lasix. Hospital course complicated by GI bleed and A fib RVR. Loaded on amio but later placed on toprol xl for rate control. On 9/5 had EGD with duodenal bleed with clip applied. He was discharged 9/12 with D/C weight 203 pounds.   Admitted late December 2016 with increased dyspnea. Diuresed with IV lasix and transitioned to po lasix 80 mg twice a day. Chronic A fib and continued on amio 200 mg twice a day. Discharge weight was 205 pounds.   Admitted 01-26-23 through 01/12/16 with marked volume overload. Diuresed with IV lasix and milrinone. D/C weight 209 pounds. He was provided medications at discharge.   He presents today for HF follow up.  He has cancelled the 4 appointments. Overall feeling ok. Mild dyspnea with exertion. Denies PND/Orthopnea. Weight at home 221 pounds. Doing much better with his medications. Smokes 1PPD.  Followed weekly by Paramedicine. Jacob [name] reports improved medication compliance.   SH: Lives in low income housing. Estranged from family ROS: All systems negative except as listed in HPI, PMH and Problem List.  SH:  Social History   Social History  . Marital status: Divorced    Spouse name: N/A  . Number of children: 3  . Years of education: N/A   Occupational History  . disabled    Social History Main Topics  . Smoking status: Current Every Day  Smoker    Packs/day: 1.00    Years: 40.00    Types: Cigarettes  . Smokeless tobacco: Never Used  . Alcohol use No  . Drug use: No  . Sexual activity: No   Other Topics Concern  . Not on file   Social History Narrative   Has an apartment with a roommate. He was living on the streets in Jan 25, 2013.  He reports that his father died in [place] in 2013/01/25.  He is divorced.  He is no longer estranged from his son, but still from his daughter who lives locally.  Neither of his parents, nor any siblings have any history of CAD.    FH:  Family History  Problem Relation Age of Onset  . Diabetes Mother     Past Medical History:  Diagnosis Date  . Atrial fibrillation (HCC)    RVR 10/2014  . Automatic implantable cardioverter-defibrillator in situ   . CAD (coronary artery disease) Sept 2013   s/p cardiac cath showing occlusion of small RCA with collaterals  . CHF (congestive heart failure) (HCC)    20 to 25 % EF and RV dysfunction by 07/2014 echo   . Chronic anticoagulation    on xarelto.   . High cholesterol   . Hypertension   . Myocardial infarction 2014  . Noncompliance    homelessness contributing.   . Peripheral arterial disease (HCC)   .  Type II diabetes mellitus (HCC)     Current Outpatient Prescriptions  Medication Sig Dispense Refill  . ACCU-CHEK SOFTCLIX LANCETS lancets Use for once daily testing of blood sugar 100 each 12  . albuterol (PROVENTIL HFA;VENTOLIN HFA) 108 (90 Base) MCG/ACT inhaler Inhale 2 puffs into the lungs every 6 (six) hours as needed for wheezing or shortness of breath. 1 Inhaler 2  . albuterol (PROVENTIL) (2.5 MG/3ML) 0.083% nebulizer solution Take 3 mLs (2.5 mg total) by nebulization every 6 (six) hours as needed for wheezing or shortness of breath. 150 mL 1  . amiodarone (PACERONE) 200 MG tablet Take 1 tablet (200 mg total) by mouth daily. 30 tablet 3  . atorvastatin (LIPITOR) 40 MG tablet Take 1 tablet (40 mg total) by mouth daily at 6 PM. 30  tablet 1  . Blood Glucose Monitoring Suppl (ACCU-CHEK AVIVA) device Use as instructed1 times daily before meals 1 each 0  . digoxin (LANOXIN) 0.125 MG tablet Take 0.5 tablets (0.0625 mg total) by mouth daily. 15 tablet 3  . ferrous sulfate 325 (65 FE) MG EC tablet Take 325 mg by mouth 3 (three) times daily with meals.     . gabapentin (NEURONTIN) 300 MG capsule Take 1 capsule (300 mg total) by mouth 2 (two) times daily. 60 capsule 5  . glipiZIDE (GLUCOTROL) 10 MG tablet TAKE 1 TABLET BY MOUTH 2 TIMES DAILY BEFORE A MEAL. 60 tablet 2  . glucose blood (ACCU-CHEK AVIVA) test strip Use as instructed for 1 times daily testing of blood sugar 100 each 12  . hydrALAZINE (APRESOLINE) 25 MG tablet Take 0.5 tablets (12.5 mg total) by mouth 3 (three) times daily. 45 tablet 3  . Insulin Glargine (LANTUS) 100 UNIT/ML Solostar Pen Inject 15 Units into the skin daily at 10 pm. 15 mL 5  . Insulin Pen Needle 31G X 5 MM MISC Use as directed for once daily insulin injection 100 each 2  . isosorbide mononitrate (IMDUR) 30 MG 24 hr tablet Take 1 tablet (30 mg total) by mouth daily. 30 tablet 3  . lactulose (CHRONULAC) 10 GM/15ML solution Take 15 mLs (10 g total) by mouth daily as needed for mild constipation. 240 mL 0  . Lancet Devices (ACCU-CHEK SOFTCLIX) lancets Use as instructed for once daily testing of blood sugar 1 each 0  . meclizine (ANTIVERT) 25 MG tablet Take 25 mg by mouth 2 (two) times daily as needed for nausea.    . rivaroxaban (XARELTO) 20 MG TABS tablet Take 1 tablet (20 mg total) by mouth daily before supper. 30 tablet 3  . spironolactone (ALDACTONE) 25 MG tablet Take 25 mg by mouth daily.    Marland Kitchen torsemide (DEMADEX) 20 MG tablet Take 4 tablets (80 mg total) by mouth 2 (two) times daily. 240 tablet 3   No current facility-administered medications for this encounter.     Vitals:   03/28/16 1403  BP: (!) 180/94  Pulse: 100  SpO2: 98%  Weight: 223 lb (101.2 kg)   Wt Readings from Last 3 Encounters:    03/28/16 223 lb (101.2 kg)  03/14/16 224 lb 3.2 oz (101.7 kg)  02/19/16 219 lb 6.4 oz (99.5 kg)      PHYSICAL EXAM: General:  NAD Walked in clinic.  HEENT: Normal Neck: supple. JVP 6-7 with very mild HJR.  Carotids 2+ bilaterally; no bruits. No thyromegaly or nodule noted. Cor: PMI normal. Irregularly irregular. No M/G/R appreciated. Lungs: Clear, normal effort Abdomen: soft, NT, ND, no HSM. No bruits  or masses. +BS   Extremities: no cyanosis, clubbing, rash, R and LLE no edema.  Neuro: alert & orientedx3, cranial nerves grossly intact. Moves all 4 extremities w/o difficulty. Affect pleasant.    ASSESSMENT & PLAN: 1.Chronic Systolic Heart Failure - ICM EF 20-25%. Has [employer] ICD.  NYHA II. Functional improvement.  - Volume status stable.  Continue torsemide 80 mg BID. - No spiro due to non-compliance.  - No BB with recent cardiogenic shock.  - No ace for now with soft SBP.  - Increase hydralazine to 25 mg three times a day. Continue current dose of imdur/digoxin.  - He is not a candidate for advanced therapies given social situation and non compliance.  2. Chronic A fib -   Rate controlled. Continue Amio 200 mg twice a day,  Xarelto. No bleeding problems.   Needs yearly eye exams.  3. H/O GI Bleed - duodenal ulcer with clip 9/5. No bleeding problems.  4. PAD- Last ABI 2015 . R EIA stent with known bilateral SFA occlusion.  Repeat ABI ok. Dr Allyson Sabal recommends yearly ABI.   5. Social  Issues - Resides in low income housing.  HFSW following.  6. Smoking - Continues to smoke. Declines smoking cessation.  7. HTN- Plan to increase hydralazine to 25 mg three times a day.      Continue Paramedicine. Follow up in 4 weeks.   Shresta Risden NP-C  2:14 PM

## 2016-03-28 NOTE — Patient Instructions (Addendum)
Follow up 4 weeks with Amy Clegg NP-C.  Routine lab work today. Will notify you of abnormal results, otherwise no news is good news!   Do the following things EVERYDAY: 1) Weigh yourself in the morning before breakfast. Write it down and keep it in a log. 2) Take your medicines as prescribed 3) Eat low salt foods-Limit salt (sodium) to 2000 mg per day.  4) Stay as active as you can everyday 5) Limit all fluids for the day to less than 2 liters

## 2016-03-29 ENCOUNTER — Telehealth (HOSPITAL_COMMUNITY): Payer: Self-pay | Admitting: Cardiology

## 2016-03-29 NOTE — Telephone Encounter (Signed)
-----   Message from Sherald Hess, NP sent at 03/28/2016  8:02 PM EST ----- Regarding: med change plx increase hydralazone to 25 mg three times a day. Please call and update [name]. Need BP check next.   thks Amy

## 2016-04-15 ENCOUNTER — Telehealth: Payer: Self-pay

## 2016-04-15 NOTE — Telephone Encounter (Signed)
Met with Markham Jordan, LPN with [id number] this morning. She stated that they will be setting up tele-monitoring for the patient.  She also noted that [name], para-medicine continues to see the patient to help with medication management.

## 2016-04-25 ENCOUNTER — Inpatient Hospital Stay (HOSPITAL_COMMUNITY): Admission: RE | Admit: 2016-04-25 | Payer: Medicare Other | Source: Ambulatory Visit

## 2016-04-30 ENCOUNTER — Other Ambulatory Visit: Payer: Self-pay | Admitting: Family Medicine

## 2016-04-30 ENCOUNTER — Telehealth (HOSPITAL_COMMUNITY): Payer: Self-pay | Admitting: *Deleted

## 2016-04-30 MED FILL — hydrALAZINE HCL 25 MG TABS: 25 | 30 days supply | Qty: 45 | Fill #1

## 2016-04-30 MED FILL — ISOSORBIDE MN ER 30 MG TAB: 30 | 30 days supply | Qty: 30 | Fill #1

## 2016-04-30 MED FILL — DIGITEK 125 MCG TABLET: 125 | 30 days supply | Qty: 15 | Fill #3

## 2016-04-30 MED FILL — TORSEMIDE 20 MG TABLET: 20 | 30 days supply | Qty: 240 | Fill #3

## 2016-04-30 NOTE — Telephone Encounter (Signed)
[name] called while visiting patient and he is up 4 lbs overnight but no other symptoms.  He stated he ate pizza yesterday and missed 2 doses of his torsemide over the last few days.  I advised katie to have him take his morning dose of torsemide 80mg  before she leaves and educate him on low sodium diet.  She will continue to monitor patient, no further questions at this time.

## 2016-05-02 ENCOUNTER — Ambulatory Visit (HOSPITAL_COMMUNITY)
Admission: RE | Admit: 2016-05-02 | Discharge: 2016-05-02 | Disposition: A | Discharge status: Home / Self Care | Payer: Medicare Other | Source: Ambulatory Visit | Attending: Cardiology | Admitting: Cardiology

## 2016-05-02 VITALS — BP 144/80 | HR 101 | Wt 229.0 lb

## 2016-05-02 DIAGNOSIS — Z95810 Presence of automatic (implantable) cardiac defibrillator: Secondary | ICD-10-CM | POA: Insufficient documentation

## 2016-05-02 DIAGNOSIS — Z951 Presence of aortocoronary bypass graft: Secondary | ICD-10-CM | POA: Insufficient documentation

## 2016-05-02 DIAGNOSIS — Z794 Long term (current) use of insulin: Secondary | ICD-10-CM | POA: Insufficient documentation

## 2016-05-02 DIAGNOSIS — I10 Essential (primary) hypertension: Secondary | ICD-10-CM | POA: Diagnosis not present

## 2016-05-02 DIAGNOSIS — I482 Chronic atrial fibrillation: Secondary | ICD-10-CM | POA: Insufficient documentation

## 2016-05-02 DIAGNOSIS — Z9889 Other specified postprocedural states: Secondary | ICD-10-CM | POA: Diagnosis not present

## 2016-05-02 DIAGNOSIS — Z9119 Patient's noncompliance with other medical treatment and regimen: Secondary | ICD-10-CM | POA: Diagnosis not present

## 2016-05-02 DIAGNOSIS — I70209 Unspecified atherosclerosis of native arteries of extremities, unspecified extremity: Secondary | ICD-10-CM | POA: Diagnosis not present

## 2016-05-02 DIAGNOSIS — I2510 Atherosclerotic heart disease of native coronary artery without angina pectoris: Secondary | ICD-10-CM | POA: Insufficient documentation

## 2016-05-02 DIAGNOSIS — F17210 Nicotine dependence, cigarettes, uncomplicated: Secondary | ICD-10-CM | POA: Insufficient documentation

## 2016-05-02 DIAGNOSIS — I481 Persistent atrial fibrillation: Secondary | ICD-10-CM | POA: Diagnosis not present

## 2016-05-02 DIAGNOSIS — I5022 Chronic systolic (congestive) heart failure: Secondary | ICD-10-CM

## 2016-05-02 DIAGNOSIS — E7800 Pure hypercholesterolemia, unspecified: Secondary | ICD-10-CM | POA: Insufficient documentation

## 2016-05-02 DIAGNOSIS — I4819 Other persistent atrial fibrillation: Secondary | ICD-10-CM

## 2016-05-02 DIAGNOSIS — I252 Old myocardial infarction: Secondary | ICD-10-CM | POA: Diagnosis not present

## 2016-05-02 DIAGNOSIS — I110 Hypertensive heart disease with heart failure: Secondary | ICD-10-CM | POA: Insufficient documentation

## 2016-05-02 DIAGNOSIS — Z720 Tobacco use: Secondary | ICD-10-CM | POA: Diagnosis not present

## 2016-05-02 DIAGNOSIS — I5042 Chronic combined systolic (congestive) and diastolic (congestive) heart failure: Secondary | ICD-10-CM | POA: Diagnosis not present

## 2016-05-02 DIAGNOSIS — Z833 Family history of diabetes mellitus: Secondary | ICD-10-CM | POA: Diagnosis not present

## 2016-05-02 DIAGNOSIS — K269 Duodenal ulcer, unspecified as acute or chronic, without hemorrhage or perforation: Secondary | ICD-10-CM | POA: Diagnosis not present

## 2016-05-02 DIAGNOSIS — Z7901 Long term (current) use of anticoagulants: Secondary | ICD-10-CM

## 2016-05-02 DIAGNOSIS — E1151 Type 2 diabetes mellitus with diabetic peripheral angiopathy without gangrene: Secondary | ICD-10-CM | POA: Diagnosis not present

## 2016-05-02 LAB — DIGOXIN LEVEL: Digoxin Level: 0.3 ng/mL — ABNORMAL LOW (ref 0.8–2.0)

## 2016-05-02 LAB — BASIC METABOLIC PANEL
Anion gap: 11 (ref 5–15)
BUN: 25 mg/dL — AB (ref 6–20)
CO2: 30 mmol/L (ref 22–32)
CREATININE: 1.25 mg/dL — AB (ref 0.61–1.24)
Calcium: 8.5 mg/dL — ABNORMAL LOW (ref 8.9–10.3)
Chloride: 92 mmol/L — ABNORMAL LOW (ref 101–111)
GFR calc Af Amer: >60 mL/min (ref >60)
GLUCOSE: 197 mg/dL — AB (ref 65–99)
POTASSIUM: 3.7 mmol/L (ref 3.5–5.1)
SODIUM: 133 mmol/L — AB (ref 135–145)

## 2016-05-02 MED ORDER — HYDRALAZINE HCL 25 MG PO TABS: 25.0000 mg | ORAL_TABLET | Freq: Three times a day (TID) | ORAL | 3 refills | Status: DC

## 2016-05-02 MED FILL — LANTUS SOLOSTAR 100 UNITS/M: 100 | 25 days supply | Qty: 3 | Fill #4

## 2016-05-02 NOTE — Patient Instructions (Signed)
Routine lab work today. Will notify you of abnormal results, otherwise no news is good news!  INCREASE Hydralazine to 25 mg (1 whole tablet) three times daily (once every 8 hours).  Follow up 1 month.  Do the following things EVERYDAY: 1) Weigh yourself in the morning before breakfast. Write it down and keep it in a log. 2) Take your medicines as prescribed 3) Eat low salt foods-Limit salt (sodium) to 2000 mg per day.  4) Stay as active as you can everyday 5) Limit all fluids for the day to less than 2 liters

## 2016-05-02 NOTE — Progress Notes (Signed)
Advanced Heart Failure Medication Review by a Pharmacist  Does the patient  feel that his/her medications are working for him/her?  yes  Has the patient been experiencing any side effects to the medications prescribed?  no  Does the patient measure his/her own blood pressure or blood glucose at home?  no   Does the patient have any problems obtaining medications due to transportation or finances?   no  Understanding of regimen: fair Understanding of indications: fair Potential of compliance: fair Patient understands to avoid NSAIDs. Patient understands to avoid decongestants.  Issues to address at subsequent visits: Compliance   Pharmacist comments: Jacob [name] is a pleasant 61 yo M presenting with [place] (paramedicine) and without his medication list. He reports fair compliance with his regimen and did not have any specific medication-related questions or concerns for me at this time.   [name]. Bonnye Fava, PharmD, BCPS, CPP Clinical Pharmacist Pager: [phone] Phone: [phone] 05/02/2016 11:38 AM      Time with patient: 10 minutes Preparation and documentation time: 2 minutes Total time: 12 minutes

## 2016-05-02 NOTE — Progress Notes (Signed)
Patient ID: Jacob [name], male   DOB: 1955/07/04, 61 y.o.   MRN: [id number]    Advanced Heart Failure Clinic Note   PCP: None  Primary HF Cardiologist: Dr Gala Romney   HPI: Jacob [name] is a 61 year old [place] male with history of systolic HF EF 20-25% via Echo 08/03/14 s/p INCEPTA ICD implant 6/14, RV dysfunction, CAD s/p CABG x 2 w/ RF MAZE 7/14 at [facility], DM type II, Chronic afib on xarelto, GI bleed 11/28/2014, and HLD.   Admitted the end of August 2016  with increased dyspnea on exertion. Later found to be in cardiogenic shock . At one point on dual pressors milrinone and norepi. Diuresed with IV lasix and transitioned to po lasix. Hospital course complicated by GI bleed and A fib RVR. Loaded on amio but later placed on toprol xl for rate control. On 9/5 had EGD with duodenal bleed with clip applied. He was discharged 9/12 with D/C weight 203 pounds.   Admitted late December 2016 with increased dyspnea. Diuresed with IV lasix and transitioned to po lasix 80 mg twice a day. Chronic A fib and continued on amio 200 mg twice a day. Discharge weight was 205 pounds.   Admitted 01-11-23 through 01/12/16 with marked volume overload. Diuresed with IV lasix and milrinone. D/C weight 209 pounds. He was provided medications at discharge.   He presents today for HF follow up.  Last seen 4 weeks ago.  Paramedicine following.  Misses his medicine intermittently. Weight at home 225 and stable. Still smoking, but down to < pack a day. Sleeps on 2 pillows. Denies lightheadedness or dizziness. Denies CP. Not SOB walking into clinic or with ADLs. Can walk about 1/4 mile before he gets tired, but thinks he is more limited by peg fatigue. Watches salt and fluid "very closely".  SH: Lives in low income housing. Estranged from family ROS: All systems negative except as listed in HPI, PMH and Problem List.  SH:  Social History   Social History  . Marital status: Divorced    Spouse name: N/A  . Number of children: 3  .  Years of education: N/A   Occupational History  . disabled    Social History Main Topics  . Smoking status: Current Every Day Smoker    Packs/day: 1.00    Years: 40.00    Types: Cigarettes  . Smokeless tobacco: Never Used  . Alcohol use No  . Drug use: No  . Sexual activity: No   Other Topics Concern  . Not on file   Social History Narrative   Has an apartment with a roommate. He was living on the streets in 2013-01-10.  He reports that his father died in [place] in 01-10-13.  He is divorced.  He is no longer estranged from his son, but still from his daughter who lives locally.  Neither of his parents, nor any siblings have any history of CAD.    FH:  Family History  Problem Relation Age of Onset  . Diabetes Mother     Past Medical History:  Diagnosis Date  . Atrial fibrillation (HCC)    RVR 10/2014  . Automatic implantable cardioverter-defibrillator in situ   . CAD (coronary artery disease) Sept 2013   s/p cardiac cath showing occlusion of small RCA with collaterals  . CHF (congestive heart failure) (HCC)    20 to 25 % EF and RV dysfunction by 07/2014 echo   . Chronic anticoagulation    on xarelto.   Marland Kitchen  High cholesterol   . Hypertension   . Myocardial infarction 2014  . Noncompliance    homelessness contributing.   . Peripheral arterial disease (HCC)   . Type II diabetes mellitus (HCC)     Current Outpatient Prescriptions  Medication Sig Dispense Refill  . ACCU-CHEK SOFTCLIX LANCETS lancets Use for once daily testing of blood sugar 100 each 12  . albuterol (PROVENTIL HFA;VENTOLIN HFA) 108 (90 Base) MCG/ACT inhaler Inhale 2 puffs into the lungs every 6 (six) hours as needed for wheezing or shortness of breath. 1 Inhaler 2  . albuterol (PROVENTIL) (2.5 MG/3ML) 0.083% nebulizer solution Take 3 mLs (2.5 mg total) by nebulization every 6 (six) hours as needed for wheezing or shortness of breath. 150 mL 1  . amiodarone (PACERONE) 200 MG tablet Take 1 tablet (200 mg  total) by mouth daily. 30 tablet 3  . atorvastatin (LIPITOR) 40 MG tablet Take 1 tablet (40 mg total) by mouth daily at 6 PM. 30 tablet 1  . Blood Glucose Monitoring Suppl (ACCU-CHEK AVIVA) device Use as instructed1 times daily before meals 1 each 0  . digoxin (LANOXIN) 0.125 MG tablet Take 0.5 tablets (0.0625 mg total) by mouth daily. 15 tablet 3  . ferrous sulfate 325 (65 FE) MG EC tablet Take 325 mg by mouth 3 (three) times daily with meals.     . gabapentin (NEURONTIN) 300 MG capsule Take 1 capsule (300 mg total) by mouth 2 (two) times daily. 60 capsule 5  . glipiZIDE (GLUCOTROL) 10 MG tablet TAKE 1 TABLET BY MOUTH 2 TIMES DAILY BEFORE A MEAL. 60 tablet 2  . glucose blood (ACCU-CHEK AVIVA) test strip Use as instructed for 1 times daily testing of blood sugar 100 each 12  . hydrALAZINE (APRESOLINE) 25 MG tablet Take 0.5 tablets (12.5 mg total) by mouth 3 (three) times daily. 45 tablet 3  . Insulin Glargine (LANTUS) 100 UNIT/ML Solostar Pen Inject 15 Units into the skin daily at 10 pm. 15 mL 5  . Insulin Pen Needle 31G X 5 MM MISC Use as directed for once daily insulin injection 100 each 2  . isosorbide mononitrate (IMDUR) 30 MG 24 hr tablet Take 1 tablet (30 mg total) by mouth daily. 30 tablet 3  . lactulose (CHRONULAC) 10 GM/15ML solution Take 15 mLs (10 g total) by mouth daily as needed for mild constipation. 240 mL 0  . Lancet Devices (ACCU-CHEK SOFTCLIX) lancets Use as instructed for once daily testing of blood sugar 1 each 0  . meclizine (ANTIVERT) 25 MG tablet Take 25 mg by mouth 2 (two) times daily as needed for nausea.    . rivaroxaban (XARELTO) 20 MG TABS tablet Take 1 tablet (20 mg total) by mouth daily before supper. 30 tablet 3  . spironolactone (ALDACTONE) 25 MG tablet Take 25 mg by mouth daily.    Marland Kitchen torsemide (DEMADEX) 20 MG tablet Take 4 tablets (80 mg total) by mouth 2 (two) times daily. 240 tablet 3   No current facility-administered medications for this encounter.      Vitals:   05/02/16 1130  BP: (!) 144/80  Pulse: (!) 101  SpO2: 98%  Weight: 229 lb (103.9 kg)   Wt Readings from Last 3 Encounters:  05/02/16 229 lb (103.9 kg)  03/28/16 223 lb (101.2 kg)  03/14/16 224 lb 3.2 oz (101.7 kg)      PHYSICAL EXAM: General:  Walked into clinic without difficulty.   HEENT: Normal Neck: supple. JVP 8-9 cm. Carotids 2+ bilaterally; no  bruits. No thyromegaly or nodule noted. Cor: PMI normal. Irregularly irregular.  No M/G/R appreciated. Lungs: CTAB, normal effort Abdomen: soft, NT, ND, no HSM. No bruits or masses. +BS  Extremities: no cyanosis, clubbing, rash, BLE edema. Neuro: alert & orientedx3, cranial nerves grossly intact. Moves all 4 extremities w/o difficulty. Affect pleasant.  ASSESSMENT & PLAN: 1.Chronic Systolic Heart Failure - ICM EF 20-25%. Has [employer] ICD.  NYHA II. Functional improvement.  - Volume status looks stable on exam.  - Continue torsemide 80 mg BID. Stressed importance of compliance. BMET today.  - No spiro due to non-compliance.  - No BB with h/o of recurrent cardiogenic shock.  - Increase hydralazine to 25 mg three times a day. He did not after last visit.  - Continue imdur 30 mg daily.  - Continue dixogin 0.0625 mg daily.  Check level today. - He is not a candidate for advanced therapies given social situation and non compliance.  - Reinforced fluid restriction to < 2 L daily, sodium restriction to less than 2000 mg daily, and the importance of daily weights.   2. Chronic A fib -   Rate controlled. Continue Amio 200 mg twice a day,  Xarelto. No bleeding problems.   Needs yearly eye exams.  - No change to current plan.  3. H/O GI Bleed - duodenal ulcer with clip 9/5. No bleeding problems.  4. PAD- Last ABI 2015 . R EIA stent with known bilateral SFA occlusion.  Repeat ABI ok. Dr Allyson Sabal recommends yearly ABI.   - No change to current plan.  5. Social  Issues - Resides in low income housing.  HFSW following.  -  Continue paramedicine.  Seems to be doing well compliance wise, comparatively.  6. Smoking - Continues to smoke up to 1 ppd. Refuses cessation.  7. HTN - Increase hydralazine to 25 mg TID.     Overall stable. BP up.  Meds and labs as above.  (BMET/Dig level) Follow up 1 month. Sooner with symptoms.    Graciella Freer, PA-C  11:44 AM   Total time spent >25 minutes. Over half that spent discussing the above.

## 2016-05-07 ENCOUNTER — Other Ambulatory Visit (HOSPITAL_COMMUNITY): Payer: Self-pay

## 2016-05-07 NOTE — Progress Notes (Signed)
Paramedicine Encounter    Patient ID: Jacob [name], male    DOB: 1956-02-25, 61 y.o.   MRN: [id number]   Patient Care Team: [name], MD as PCP - General (Internal Medicine) Clarisa Schools, RN as Registered Nurse Corbin Ade, MD as Consulting Physician (Gastroenterology)  Patient Active Problem List   Diagnosis Date Noted  . Congestive heart disease (HCC) 01/01/2016  . Diabetic neuropathy (HCC) 11/28/2015  . Ascites 11/09/2015  . CHF (congestive heart failure) (HCC) 11/09/2015  . Insomnia 04/18/2015  . Claudication of right lower extremity (HCC) 04/07/2015  . Cardiomyopathy, ischemic 04/07/2015  . Chronic anticoagulation 03/30/2015  . Cardiorenal syndrome with renal failure   . SOB (shortness of breath)   . Morbid obesity due to excess calories (HCC)   . Helicobacter pylori ab+ 12/12/2014  . Calf pain   . Duodenal ulcer with hemorrhage   . Hemorrhagic shock   . Cardiogenic shock (HCC)   . Coronary artery disease involving native coronary artery of native heart without angina pectoris   . Atrial fibrillation with rapid ventricular response (HCC) 10/29/2014  . Chest pain   . Atherosclerosis of native arteries of extremity with intermittent claudication (HCC) 09/06/2014  . Atrial fibrillation with controlled ventricular response (HCC) 08/03/2014  . Bacteremia   . DM (diabetes mellitus), type 2 with peripheral vascular complications (HCC) 08/01/2014  . Anemia of chronic disease 08/01/2014  . PVD (peripheral vascular disease) (HCC) 10/29/2013  . Tobacco abuse 05/13/2013  . AF (atrial fibrillation) (HCC) 02/11/2013  . ICD - in place- BS May 2014 [facility] 02/11/2013  . Microcytic anemia 01/11/2013  . 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 Noncompliance 12/01/2011  . Essential hypertension 10/30/2006    Current Outpatient Prescriptions:  .  ACCU-CHEK SOFTCLIX LANCETS lancets, Use for once daily testing of blood sugar, Disp: 100 each, Rfl: 12 .   albuterol (PROVENTIL HFA;VENTOLIN HFA) 108 (90 Base) MCG/ACT inhaler, Inhale 2 puffs into the lungs every 6 (six) hours as needed for wheezing or shortness of breath., Disp: 1 Inhaler, Rfl: 2 .  albuterol (PROVENTIL) (2.5 MG/3ML) 0.083% nebulizer solution, Take 3 mLs (2.5 mg total) by nebulization every 6 (six) hours as needed for wheezing or shortness of breath., Disp: 150 mL, Rfl: 1 .  amiodarone (PACERONE) 200 MG tablet, Take 1 tablet (200 mg total) by mouth daily., Disp: 30 tablet, Rfl: 3 .  atorvastatin (LIPITOR) 40 MG tablet, Take 1 tablet (40 mg total) by mouth daily at 6 PM., Disp: 30 tablet, Rfl: 1 .  Blood Glucose Monitoring Suppl (ACCU-CHEK AVIVA) device, Use as instructed1 times daily before meals, Disp: 1 each, Rfl: 0 .  digoxin (LANOXIN) 0.125 MG tablet, Take 0.5 tablets (0.0625 mg total) by mouth daily., Disp: 15 tablet, Rfl: 3 .  ferrous sulfate 325 (65 FE) MG EC tablet, Take 325 mg by mouth 3 (three) times daily with meals. , Disp: , Rfl:  .  gabapentin (NEURONTIN) 300 MG capsule, Take 1 capsule (300 mg total) by mouth 2 (two) times daily., Disp: 60 capsule, Rfl: 5 .  glipiZIDE (GLUCOTROL) 10 MG tablet, TAKE 1 TABLET BY MOUTH 2 TIMES DAILY BEFORE A MEAL., Disp: 60 tablet, Rfl: 2 .  glucose blood (ACCU-CHEK AVIVA) test strip, Use as instructed for 1 times daily testing of blood sugar, Disp: 100 each, Rfl: 12 .  hydrALAZINE (APRESOLINE) 25 MG tablet, Take 1 tablet (25 mg total) by mouth 3 (three) times daily., Disp: 90 tablet, Rfl: 3 .  Insulin Glargine (LANTUS) 100 UNIT/ML Solostar Pen, Inject 15 Units into the skin daily at 10 pm., Disp: 15 mL, Rfl: 5 .  Insulin Pen Needle 31G X 5 MM MISC, Use as directed for once daily insulin injection, Disp: 100 each, Rfl: 2 .  isosorbide mononitrate (IMDUR) 30 MG 24 hr tablet, Take 1 tablet (30 mg total) by mouth daily., Disp: 30 tablet, Rfl: 3 .  lactulose (CHRONULAC) 10 GM/15ML solution, Take 15 mLs (10 g total) by mouth daily as needed for mild  constipation., Disp: 240 mL, Rfl: 0 .  Lancet Devices (ACCU-CHEK SOFTCLIX) lancets, Use as instructed for once daily testing of blood sugar, Disp: 1 each, Rfl: 0 .  meclizine (ANTIVERT) 25 MG tablet, Take 25 mg by mouth 2 (two) times daily as needed for nausea., Disp: , Rfl:  .  rivaroxaban (XARELTO) 20 MG TABS tablet, Take 1 tablet (20 mg total) by mouth daily before supper., Disp: 30 tablet, Rfl: 3 .  spironolactone (ALDACTONE) 25 MG tablet, Take 25 mg by mouth daily., Disp: , Rfl:  .  torsemide (DEMADEX) 20 MG tablet, Take 4 tablets (80 mg total) by mouth 2 (two) times daily., Disp: 240 tablet, Rfl: 3 Allergies  Allergen Reactions  . Tape Itching    Paper tape please.     Social History   Social History  . Marital status: Divorced    Spouse name: N/A  . Number of children: 3  . Years of education: N/A   Occupational History  . disabled    Social History Main Topics  . Smoking status: Current Every Day Smoker    Packs/day: 1.00    Years: 40.00    Types: Cigarettes  . Smokeless tobacco: Never Used  . Alcohol use No  . Drug use: No  . Sexual activity: No   Other Topics Concern  . Not on file   Social History Narrative   Has an apartment with a roommate. He was living on the streets in 07-Feb-2013.  He reports that his father died in [place] in 02-07-13.  He is divorced.  He is no longer estranged from his son, but still from his daughter who lives locally.  Neither of his parents, nor any siblings have any history of CAD.    Physical Exam  Eyes: Pupils are equal, round, and reactive to light.  Pulmonary/Chest: No respiratory distress. He has no wheezes.  Abdominal: Soft. He exhibits no distension. There is no tenderness. There is no rebound and no guarding.  Musculoskeletal: He exhibits no edema.  Skin: Skin is warm and dry. He is not diaphoretic.        SAFE - 05/07/16 1000      Situation   Admitting diagnosis CHF   Heart failure history Exisiting    Comorbidities Atrial fibillation;DM   Readmitted within 30 days No   Hospital admission within past 12 months Yes   number of hospital admissions 1   number of ED visits 1   Target Weight 225 lbs     Assessment   Lives alone Yes   Primary support person YES   Mode of transportation personal car   Other services involved Other  HOME HEALTH AIDE([employer])   Home equipement Scale     Weight   Weighs self daily Yes   Scale provided Yes   Records on weight chart No     Resources   Has "Living better w/heart failure" book Yes   Has HF  Zone tool Yes   Able to identify yellow zone signs/when to call MD Yes   Records zone daily No     Medications   Uses a pill box Yes   Who stocks the pill box [facility]   Pill box checked this visit Yes   Pill box refilled this visit Yes   Difficulty obtaining medications No   Mail order medications No   Missed one or more doses of medications per week Yes   How many missed doses this week 6     Nutrition   Patient receives meals on wheels No   Patient follows low sodium diet Yes   Has foods at home that meet the current recommended diet Yes   Patient follows low sugar/card diet Yes   Nutritional concerns/issues PT EATS PIZZA, [place](WEEKLY)     Activity Level   ADL's/Mobility Independent   How many feet can patient ambulate 300   Typical activity level INDEPENTANT   Barriers LANGUAGE   Actvity tolerance: NYHA Class 4     Urine   Difficulty urinating No   Changes in urine None     Time spent with patient   Time spent with patient  35 Minutes         [employer] Living Environment - 05/07/16 1000      Outside of House   Sidewalk and pathway to house is level and free from any hazards Yes   Driveway is free from debris/snow/ice N/A   Outside stairs are stable and have sturdy handrail N/A   Porch lights are working and provide adequate lighting Yes     Living Room   Furniture is of adequate height and offers  arm rests that assist in getting up and down Yes   Floor is free from any clutter that would create tripping hazards Yes   All cords are either behind furniture or secured in a manner that does not cause trip hazards Yes   All rugs are secured to floor with double-sided tape N/A   Lighting is adequate to light room Yes   All lighting has an easily accessible on/off switch Yes   Phone is readily accessible near favorite seating areas Yes   Emergency numbers are printed near all phones in house Yes     Kitchen   Items used most often are within easy reach on low shelves Yes   Step stool is present, is sturdy and has a handrail No   Floor mats are non-slip tread and secured to floor N/A   Oven controls are within easy reach Yes   Kitchen lighting is adequate and easy to reach switches Yes   ABC fire extinguisher is located in kitchen N/A     Stairs   Carpet is properly secured to stairs and/or all wood is properly secured N/A   Handrail is present and sturdy N/A   Stairs are free from any clutter N/A   Stairway is adequately lit N/A     Bathroom   Tub and shower have a non-slip surface No   Tub and/or shower have a grab bar for stability No   Toilet has a raised seat No   Grab bar is attached near toilet for assistance No   Pathway from bedroom to bathroom is free from clutter and well lit for ease of movement in the middle of the night Yes     Bedroom   Floor is free from clutter Yes   Light is  near bed and is easy to turn on Yes   Phone is next to bed and within easy reach Yes   Flashlight is near bed in case of emergency No     General   Smoke detectors in all areas of the house (each floor) and tested Yes   CO detectors on each floor of house and tested Yes   Flashlights are handy throughout the home No   Resident has all medical information readily available and in an area emergency providers will easily find Yes   All heaters are away from any type of flammable material  Yes     Overall Tips   Homeowner ha good non-skid shoes to move around house Yes   All assisted walking devices are readily accessible and in good condition N/A   There is a phone near the floor for ease of reach in case of a fall YES   All O2 tubing is less than 50 ft. and is not a trip hazard N/A   Resident has had an annual hearing and vision check by a physician Yes   Resident has the proper hearing and visual aids prescribed and are in good working order N/A   All medications are properly stored and labeled to avoid confusion on dosage, time to take, and avoidance of missed doses Yes       Future Appointments Date Time Provider Department Center  05/15/2016 2:30 PM [facility] 3 [facility]  05/30/2016 1:30 PM [facility] PA/NP [facility] None     ACTION: Home visit completed Next visit planned for TUES 9AM        Jacob [name]  ATF pt CAO x4 with no complaints this am.  He stated that he went to the beach over the weekend but did not have alcohol.  Pt stated he stayed within the low sodium diet throughout the weekend.  Pt denies SOB, nausea, headaches, dizziness, and Cp.  Pt still has not gotten his test strips.  Rx bottles and pill box verified.    cbg 182  Today weight: 228 Yesterday weight: 228 Last visit: 225 BP 136/80   Pulse 92   Resp 14   SpO2 98%

## 2016-05-08 ENCOUNTER — Telehealth: Payer: Self-pay | Admitting: Internal Medicine

## 2016-05-08 NOTE — Telephone Encounter (Signed)
Patient is requesting a referral for specialist in regards to his varicose vein. Please follow up.  Thank you.

## 2016-05-13 NOTE — Telephone Encounter (Signed)
Patient is requesting a specialist to monitor the varicose veins he has Is this request appropriate?

## 2016-05-13 NOTE — Telephone Encounter (Signed)
Patient needs to be seen to determine referral.

## 2016-05-14 ENCOUNTER — Other Ambulatory Visit: Payer: Self-pay | Admitting: Cardiovascular Disease

## 2016-05-14 ENCOUNTER — Other Ambulatory Visit (HOSPITAL_COMMUNITY): Payer: Self-pay

## 2016-05-14 DIAGNOSIS — I779 Disorder of arteries and arterioles, unspecified: Secondary | ICD-10-CM

## 2016-05-14 NOTE — Progress Notes (Signed)
Paramedicine Encounter    Patient ID: Jacob [name], male    DOB: 1956-02-25, 61 y.o.   MRN: [id number]   Patient Care Team: [name], MD as PCP - General (Internal Medicine) Clarisa Schools, RN as Registered Nurse Corbin Ade, MD as Consulting Physician (Gastroenterology)  Patient Active Problem List   Diagnosis Date Noted  . Congestive heart disease (HCC) 01/01/2016  . Diabetic neuropathy (HCC) 11/28/2015  . Ascites 11/09/2015  . CHF (congestive heart failure) (HCC) 11/09/2015  . Insomnia 04/18/2015  . Claudication of right lower extremity (HCC) 04/07/2015  . Cardiomyopathy, ischemic 04/07/2015  . Chronic anticoagulation 03/30/2015  . Cardiorenal syndrome with renal failure   . SOB (shortness of breath)   . Morbid obesity due to excess calories (HCC)   . Helicobacter pylori ab+ 12/12/2014  . Calf pain   . Duodenal ulcer with hemorrhage   . Hemorrhagic shock   . Cardiogenic shock (HCC)   . Coronary artery disease involving native coronary artery of native heart without angina pectoris   . Atrial fibrillation with rapid ventricular response (HCC) 10/29/2014  . Chest pain   . Atherosclerosis of native arteries of extremity with intermittent claudication (HCC) 09/06/2014  . Atrial fibrillation with controlled ventricular response (HCC) 08/03/2014  . Bacteremia   . DM (diabetes mellitus), type 2 with peripheral vascular complications (HCC) 08/01/2014  . Anemia of chronic disease 08/01/2014  . PVD (peripheral vascular disease) (HCC) 10/29/2013  . Tobacco abuse 05/13/2013  . AF (atrial fibrillation) (HCC) 02/11/2013  . ICD - in place- BS May 2014 [facility] 02/11/2013  . Microcytic anemia 01/11/2013  . 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 Noncompliance 12/01/2011  . Essential hypertension 10/30/2006    Current Outpatient Prescriptions:  .  ACCU-CHEK SOFTCLIX LANCETS lancets, Use for once daily testing of blood sugar, Disp: 100 each, Rfl: 12 .   albuterol (PROVENTIL HFA;VENTOLIN HFA) 108 (90 Base) MCG/ACT inhaler, Inhale 2 puffs into the lungs every 6 (six) hours as needed for wheezing or shortness of breath., Disp: 1 Inhaler, Rfl: 2 .  albuterol (PROVENTIL) (2.5 MG/3ML) 0.083% nebulizer solution, Take 3 mLs (2.5 mg total) by nebulization every 6 (six) hours as needed for wheezing or shortness of breath., Disp: 150 mL, Rfl: 1 .  amiodarone (PACERONE) 200 MG tablet, Take 1 tablet (200 mg total) by mouth daily., Disp: 30 tablet, Rfl: 3 .  atorvastatin (LIPITOR) 40 MG tablet, Take 1 tablet (40 mg total) by mouth daily at 6 PM., Disp: 30 tablet, Rfl: 1 .  Blood Glucose Monitoring Suppl (ACCU-CHEK AVIVA) device, Use as instructed1 times daily before meals, Disp: 1 each, Rfl: 0 .  digoxin (LANOXIN) 0.125 MG tablet, Take 0.5 tablets (0.0625 mg total) by mouth daily., Disp: 15 tablet, Rfl: 3 .  ferrous sulfate 325 (65 FE) MG EC tablet, Take 325 mg by mouth 3 (three) times daily with meals. , Disp: , Rfl:  .  gabapentin (NEURONTIN) 300 MG capsule, Take 1 capsule (300 mg total) by mouth 2 (two) times daily., Disp: 60 capsule, Rfl: 5 .  glipiZIDE (GLUCOTROL) 10 MG tablet, TAKE 1 TABLET BY MOUTH 2 TIMES DAILY BEFORE A MEAL., Disp: 60 tablet, Rfl: 2 .  glucose blood (ACCU-CHEK AVIVA) test strip, Use as instructed for 1 times daily testing of blood sugar, Disp: 100 each, Rfl: 12 .  hydrALAZINE (APRESOLINE) 25 MG tablet, Take 1 tablet (25 mg total) by mouth 3 (three) times daily., Disp: 90 tablet, Rfl: 3 .  Insulin Glargine (LANTUS) 100 UNIT/ML Solostar Pen, Inject 15 Units into the skin daily at 10 pm., Disp: 15 mL, Rfl: 5 .  Insulin Pen Needle 31G X 5 MM MISC, Use as directed for once daily insulin injection, Disp: 100 each, Rfl: 2 .  isosorbide mononitrate (IMDUR) 30 MG 24 hr tablet, Take 1 tablet (30 mg total) by mouth daily., Disp: 30 tablet, Rfl: 3 .  lactulose (CHRONULAC) 10 GM/15ML solution, Take 15 mLs (10 g total) by mouth daily as needed for mild  constipation., Disp: 240 mL, Rfl: 0 .  Lancet Devices (ACCU-CHEK SOFTCLIX) lancets, Use as instructed for once daily testing of blood sugar, Disp: 1 each, Rfl: 0 .  meclizine (ANTIVERT) 25 MG tablet, Take 25 mg by mouth 2 (two) times daily as needed for nausea., Disp: , Rfl:  .  rivaroxaban (XARELTO) 20 MG TABS tablet, Take 1 tablet (20 mg total) by mouth daily before supper., Disp: 30 tablet, Rfl: 3 .  spironolactone (ALDACTONE) 25 MG tablet, Take 25 mg by mouth daily., Disp: , Rfl:  .  torsemide (DEMADEX) 20 MG tablet, Take 4 tablets (80 mg total) by mouth 2 (two) times daily., Disp: 240 tablet, Rfl: 3 Allergies  Allergen Reactions  . Tape Itching    Paper tape please.     Social History   Social History  . Marital status: Divorced    Spouse name: N/A  . Number of children: 3  . Years of education: N/A   Occupational History  . disabled    Social History Main Topics  . Smoking status: Current Every Day Smoker    Packs/day: 1.00    Years: 40.00    Types: Cigarettes  . Smokeless tobacco: Never Used  . Alcohol use No  . Drug use: No  . Sexual activity: No   Other Topics Concern  . Not on file   Social History Narrative   Has an apartment with a roommate. He was living on the streets in 01-03-13.  He reports that his father died in [place] in Jan 03, 2013.  He is divorced.  He is no longer estranged from his son, but still from his daughter who lives locally.  Neither of his parents, nor any siblings have any history of CAD.    Physical Exam  Eyes: Pupils are equal, round, and reactive to light.  Pulmonary/Chest: No respiratory distress. He has no wheezes. He has no rales.  Abdominal: He exhibits no distension. There is no tenderness. There is no rebound and no guarding.  Musculoskeletal: He exhibits no edema.  Skin: Skin is warm and dry. He is not diaphoretic.        SAFE - 05/07/16 1000      Situation   Admitting diagnosis CHF   Heart failure history  Exisiting   Comorbidities Atrial fibillation;DM   Readmitted within 30 days No   Hospital admission within past 12 months Yes   number of hospital admissions 1   number of ED visits 1   Target Weight 225 lbs     Assessment   Lives alone Yes   Primary support person YES   Mode of transportation personal car   Other services involved Other  HOME HEALTH AIDE([employer])   Home equipement Scale     Weight   Weighs self daily Yes   Scale provided Yes   Records on weight chart No     Resources   Has "Living better w/heart failure" book Yes  Has HF Zone tool Yes   Able to identify yellow zone signs/when to call MD Yes   Records zone daily No     Medications   Uses a pill box Yes   Who stocks the pill box [facility]   Pill box checked this visit Yes   Pill box refilled this visit Yes   Difficulty obtaining medications No   Mail order medications No   Missed one or more doses of medications per week Yes   How many missed doses this week 6     Nutrition   Patient receives meals on wheels No   Patient follows low sodium diet Yes   Has foods at home that meet the current recommended diet Yes   Patient follows low sugar/card diet Yes   Nutritional concerns/issues PT EATS PIZZA, [place](WEEKLY)     Activity Level   ADL's/Mobility Independent   How many feet can patient ambulate 300   Typical activity level INDEPENTANT   Barriers LANGUAGE   Actvity tolerance: NYHA Class 4     Urine   Difficulty urinating No   Changes in urine None     Time spent with patient   Time spent with patient  60 Minutes        Future Appointments Date Time Provider Department Center  05/15/2016 2:30 PM [facility]  05/30/2016 1:30 PM [facility] PA/NP [facility] None   ATF pt CAO x4 with no complaints. Pt denies SOB, dizziness, headaches, and chest pain.  Pt has not taken his morning medications yet. Pt's home aide is still coming daily for 2 hours, to cook and clean. She  cooks pt food with no salt and she does his grocery shopping for him (food stamp card). Pt has missed several medications doses. Pt is now working and doesn't take his rx with him to work (afternoon rx). Pt advised to take his rx with him to work. rx bottles and pill box verified.  Refill called in: [id number] gab  cbg 254  BP 122/76 (BP Location: Right Arm, Patient Position: Sitting, Cuff Size: Normal)   Pulse 88   Resp 16   Wt 228 lb (103.4 kg)   SpO2 97%   BMI 37.94 kg/m   Weight yesterday-227 Last visit weight-228    ACTION: Home visit completed Next visit planned for next tues 9 am

## 2016-05-14 NOTE — Telephone Encounter (Signed)
Per Provider patient must be scheduled an appointment to determine the need for referral. Thank you!

## 2016-05-15 ENCOUNTER — Inpatient Hospital Stay (HOSPITAL_COMMUNITY): Admission: RE | Admit: 2016-05-15 | Payer: Medicare Other | Source: Ambulatory Visit

## 2016-05-17 ENCOUNTER — Encounter: Payer: Self-pay | Admitting: Cardiology

## 2016-05-22 ENCOUNTER — Telehealth: Payer: Self-pay | Admitting: Internal Medicine

## 2016-05-22 ENCOUNTER — Other Ambulatory Visit: Payer: Self-pay | Admitting: Family Medicine

## 2016-05-22 ENCOUNTER — Other Ambulatory Visit (HOSPITAL_COMMUNITY): Payer: Self-pay

## 2016-05-22 NOTE — Progress Notes (Signed)
Paramedicine Encounter    Patient ID: Jacob [name], male    DOB: 07-09-1955, 61 y.o.   MRN: [id number]  Patient Care Team: [name], MD as PCP - General (Internal Medicine) Clarisa Schools, RN as Registered Nurse Corbin Ade, MD as Consulting Physician (Gastroenterology)  Patient Active Problem List   Diagnosis Date Noted  . Congestive heart disease (HCC) 01/01/2016  . Diabetic neuropathy (HCC) 11/28/2015  . Ascites 11/09/2015  . CHF (congestive heart failure) (HCC) 11/09/2015  . Insomnia 04/18/2015  . Claudication of right lower extremity (HCC) 04/07/2015  . Cardiomyopathy, ischemic 04/07/2015  . Chronic anticoagulation 03/30/2015  . Cardiorenal syndrome with renal failure   . SOB (shortness of breath)   . Morbid obesity due to excess calories (HCC)   . Helicobacter pylori ab+ 12/12/2014  . Calf pain   . Duodenal ulcer with hemorrhage   . Hemorrhagic shock   . Cardiogenic shock (HCC)   . Coronary artery disease involving native coronary artery of native heart without angina pectoris   . Atrial fibrillation with rapid ventricular response (HCC) 10/29/2014  . Chest pain   . Atherosclerosis of native arteries of extremity with intermittent claudication (HCC) 09/06/2014  . Atrial fibrillation with controlled ventricular response (HCC) 08/03/2014  . Bacteremia   . DM (diabetes mellitus), type 2 with peripheral vascular complications (HCC) 08/01/2014  . Anemia of chronic disease 08/01/2014  . PVD (peripheral vascular disease) (HCC) 10/29/2013  . Tobacco abuse 05/13/2013  . AF (atrial fibrillation) (HCC) 02/11/2013  . ICD - in place- BS May 2014 [facility] 02/11/2013  . Microcytic anemia 01/11/2013  . 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 Noncompliance 12/01/2011  . Essential hypertension 10/30/2006    Current Outpatient Prescriptions:  .  ACCU-CHEK SOFTCLIX LANCETS lancets, Use for once daily testing of blood sugar, Disp: 100 each, Rfl: 12 .  albuterol  (PROVENTIL HFA;VENTOLIN HFA) 108 (90 Base) MCG/ACT inhaler, Inhale 2 puffs into the lungs every 6 (six) hours as needed for wheezing or shortness of breath., Disp: 1 Inhaler, Rfl: 2 .  albuterol (PROVENTIL) (2.5 MG/3ML) 0.083% nebulizer solution, Take 3 mLs (2.5 mg total) by nebulization every 6 (six) hours as needed for wheezing or shortness of breath., Disp: 150 mL, Rfl: 1 .  amiodarone (PACERONE) 200 MG tablet, Take 1 tablet (200 mg total) by mouth daily., Disp: 30 tablet, Rfl: 3 .  atorvastatin (LIPITOR) 40 MG tablet, Take 1 tablet (40 mg total) by mouth daily at 6 PM., Disp: 30 tablet, Rfl: 1 .  Blood Glucose Monitoring Suppl (ACCU-CHEK AVIVA) device, Use as instructed1 times daily before meals, Disp: 1 each, Rfl: 0 .  digoxin (LANOXIN) 0.125 MG tablet, Take 0.5 tablets (0.0625 mg total) by mouth daily., Disp: 15 tablet, Rfl: 3 .  ferrous sulfate 325 (65 FE) MG EC tablet, Take 325 mg by mouth 3 (three) times daily with meals. , Disp: , Rfl:  .  gabapentin (NEURONTIN) 300 MG capsule, Take 1 capsule (300 mg total) by mouth 2 (two) times daily., Disp: 60 capsule, Rfl: 5 .  glipiZIDE (GLUCOTROL) 10 MG tablet, TAKE 1 TABLET BY MOUTH 2 TIMES DAILY BEFORE A MEAL., Disp: 60 tablet, Rfl: 2 .  glucose blood (ACCU-CHEK AVIVA) test strip, Use as instructed for 1 times daily testing of blood sugar, Disp: 100 each, Rfl: 12 .  hydrALAZINE (APRESOLINE) 25 MG tablet, Take 1 tablet (25 mg total) by mouth 3 (three) times daily., Disp: 90 tablet, Rfl: 3 .  Insulin Glargine (LANTUS) 100 UNIT/ML Solostar Pen, Inject 15 Units into the skin daily at 10 pm., Disp: 15 mL, Rfl: 5 .  Insulin Pen Needle 31G X 5 MM MISC, Use as directed for once daily insulin injection, Disp: 100 each, Rfl: 2 .  isosorbide mononitrate (IMDUR) 30 MG 24 hr tablet, Take 1 tablet (30 mg total) by mouth daily., Disp: 30 tablet, Rfl: 3 .  lactulose (CHRONULAC) 10 GM/15ML solution, Take 15 mLs (10 g total) by mouth daily as needed for mild  constipation., Disp: 240 mL, Rfl: 0 .  Lancet Devices (ACCU-CHEK SOFTCLIX) lancets, Use as instructed for once daily testing of blood sugar, Disp: 1 each, Rfl: 0 .  meclizine (ANTIVERT) 25 MG tablet, Take 25 mg by mouth 2 (two) times daily as needed for nausea., Disp: , Rfl:  .  rivaroxaban (XARELTO) 20 MG TABS tablet, Take 1 tablet (20 mg total) by mouth daily before supper., Disp: 30 tablet, Rfl: 3 .  spironolactone (ALDACTONE) 25 MG tablet, Take 25 mg by mouth daily., Disp: , Rfl:  .  torsemide (DEMADEX) 20 MG tablet, Take 4 tablets (80 mg total) by mouth 2 (two) times daily., Disp: 240 tablet, Rfl: 3 Allergies  Allergen Reactions  . Tape Itching    Paper tape please.     Social History   Social History  . Marital status: Divorced    Spouse name: N/A  . Number of children: 3  . Years of education: N/A   Occupational History  . disabled    Social History Main Topics  . Smoking status: Current Every Day Smoker    Packs/day: 1.00    Years: 40.00    Types: Cigarettes  . Smokeless tobacco: Never Used  . Alcohol use No  . Drug use: No  . Sexual activity: No   Other Topics Concern  . Not on file   Social History Narrative   Has an apartment with a roommate. He was living on the streets in 01/17/2013.  He reports that his father died in [place] in 17-Jan-2013.  He is divorced.  He is no longer estranged from his son, but still from his daughter who lives locally.  Neither of his parents, nor any siblings have any history of CAD.    Physical Exam  Eyes: Pupils are equal, round, and reactive to light.  Pulmonary/Chest: No respiratory distress. He has no wheezes. He has no rales.  Abdominal: He exhibits no distension. There is no tenderness. There is no rebound and no guarding.  Musculoskeletal: He exhibits edema.  Skin: Skin is warm and dry. He is not diaphoretic.        SAFE - 05/07/16 1000      Situation   Admitting diagnosis CHF   Heart failure history Exisiting    Comorbidities Atrial fibillation;DM   Readmitted within 30 days No   Hospital admission within past 12 months Yes   number of hospital admissions 1   number of ED visits 1   Target Weight 225 lbs     Assessment   Lives alone Yes   Primary support person YES   Mode of transportation personal car   Other services involved Other  HOME HEALTH AIDE([employer])   Home equipement Scale     Weight   Weighs self daily Yes   Scale provided Yes   Records on weight chart No     Resources   Has "Living better w/heart failure" book Yes  Has HF Zone tool Yes   Able to identify yellow zone signs/when to call MD Yes   Records zone daily No     Medications   Uses a pill box Yes   Who stocks the pill box [facility]   Pill box checked this visit Yes   Pill box refilled this visit Yes   Difficulty obtaining medications No   Mail order medications No   Missed one or more doses of medications per week Yes   How many missed doses this week 6     Nutrition   Patient receives meals on wheels No   Patient follows low sodium diet Yes   Has foods at home that meet the current recommended diet Yes   Patient follows low sugar/card diet Yes   Nutritional concerns/issues PT EATS PIZZA, [place](WEEKLY)     Activity Level   ADL's/Mobility Independent   How many feet can patient ambulate 300   Typical activity level INDEPENTANT   Barriers LANGUAGE   Actvity tolerance: NYHA Class 4     Urine   Difficulty urinating No   Changes in urine None     Time spent with patient   Time spent with patient  60 Minutes        Future Appointments Date Time Provider Department Center  05/30/2016 1:30 PM [facility] PA/NP [facility] None  05/31/2016 1:30 PM [facility] VASC 2 [facility]   ATF pt CAO x4 with complaints of leg pain which is his norm. Pt has an appointment for ultrasound on his legs.  Pt reports that he maybe regaining fluid due to his abdomen. Pt denies sob, dizziness, and headaches.  Pt  that he walked around the [employer] yesterday for exercise.  Pt is refusing to take his medications this afternoon because he has a class.  Pt feet are swollen and feels "tight".  CHP notified heart clinic of the same.   BP 118/70 (BP Location: Right Arm, Patient Position: Sitting, Cuff Size: Normal)   Pulse 91   Resp 16   Wt 231 lb (104.8 kg)   SpO2 97%   BMI 38.44 kg/m  Cbg: 219  Refilled called in:  Glipizide 161096, ferrous [id number], gabapentin 574740 gabepentin (filled until Monday) ferrrous sulfate  Weight yesterday-233 Last visit weight-228    ACTION: Home visit completed

## 2016-05-22 NOTE — Telephone Encounter (Signed)
Demetria called to speak with PCP regarding patient's medication. Demetria noticed that patient is taking Pantorazle 40mg  but he doesn't find that on patient's medication list and there's no refills for it.  Thank you.

## 2016-05-27 MED FILL — FERROUS SULFATE 325 MG TAB: 325 (65 FE) | 30 days supply | Qty: 90 | Fill #0

## 2016-05-27 MED FILL — glipiZIDE 10 MG TABS: 10 | 30 days supply | Qty: 60 | Fill #1

## 2016-05-28 ENCOUNTER — Telehealth: Payer: Self-pay

## 2016-05-28 NOTE — Telephone Encounter (Signed)
Call placed to the patient to check on his status and to discuss scheduling a follow up appointment at [facility] to monitor his DM.  He said that he is doing well. He noted that Geraldine Contras is visiting him from the para-medicine program and she has been setting up his medications. He said that he needs to call her today.  He also said that his [employer] aide comes for 2 hours x 7 days a week to help with food shopping and housekeeping. He noted that she comes in the morning and reminds him to check his weight and blood sugar. He said that his blood sugar was 159 yesterday morning. The aide is late coming today and he has not yet checked his blood sugar this morning.  He said that he is not working any more.  He was agreeable to scheduling a follow up appointment with Dr Venetia Night and an appointment was scheduled for 06/05/16 @ 1030.

## 2016-05-29 ENCOUNTER — Other Ambulatory Visit (HOSPITAL_COMMUNITY): Payer: Self-pay

## 2016-05-29 MED FILL — SPIRONOLACTONE 25 MG TABLET: 25 | 30 days supply | Qty: 30 | Fill #3

## 2016-05-29 MED FILL — GABAPENTIN 300 MG CAPSULE: 300 | 30 days supply | Qty: 60 | Fill #2

## 2016-05-29 NOTE — Progress Notes (Signed)
Paramedicine Encounter    Patient ID: Jacob [name], male    DOB: 1955-06-14, 61 y.o.   MRN: [id number]   Patient Care Team: [name], MD as PCP - General (Internal Medicine) Clarisa Schools, RN as Registered Nurse Corbin Ade, MD as Consulting Physician (Gastroenterology)  Patient Active Problem List   Diagnosis Date Noted  . Congestive heart disease (HCC) 01/01/2016  . Diabetic neuropathy (HCC) 11/28/2015  . Ascites 11/09/2015  . CHF (congestive heart failure) (HCC) 11/09/2015  . Insomnia 04/18/2015  . Claudication of right lower extremity (HCC) 04/07/2015  . Cardiomyopathy, ischemic 04/07/2015  . Chronic anticoagulation 03/30/2015  . Cardiorenal syndrome with renal failure   . SOB (shortness of breath)   . Morbid obesity due to excess calories (HCC)   . Helicobacter pylori ab+ 12/12/2014  . Calf pain   . Duodenal ulcer with hemorrhage   . Hemorrhagic shock   . Cardiogenic shock (HCC)   . Coronary artery disease involving native coronary artery of native heart without angina pectoris   . Atrial fibrillation with rapid ventricular response (HCC) 10/29/2014  . Chest pain   . Atherosclerosis of native arteries of extremity with intermittent claudication (HCC) 09/06/2014  . Atrial fibrillation with controlled ventricular response (HCC) 08/03/2014  . Bacteremia   . DM (diabetes mellitus), type 2 with peripheral vascular complications (HCC) 08/01/2014  . Anemia of chronic disease 08/01/2014  . PVD (peripheral vascular disease) (HCC) 10/29/2013  . Tobacco abuse 05/13/2013  . AF (atrial fibrillation) (HCC) 02/11/2013  . ICD - in place- BS May 2014 [facility] 02/11/2013  . Microcytic anemia 01/11/2013  . CAD- s/p CABG July 2014 [facility] 12/04/2011  . Noncompliance 12/01/2011  . Essential hypertension 10/30/2006    Current Outpatient Prescriptions:  .  ACCU-CHEK SOFTCLIX LANCETS lancets, Use for once daily testing of blood sugar, Disp: 100 each, Rfl: 12 .   albuterol (PROVENTIL HFA;VENTOLIN HFA) 108 (90 Base) MCG/ACT inhaler, Inhale 2 puffs into the lungs every 6 (six) hours as needed for wheezing or shortness of breath., Disp: 1 Inhaler, Rfl: 2 .  albuterol (PROVENTIL) (2.5 MG/3ML) 0.083% nebulizer solution, Take 3 mLs (2.5 mg total) by nebulization every 6 (six) hours as needed for wheezing or shortness of breath., Disp: 150 mL, Rfl: 1 .  amiodarone (PACERONE) 200 MG tablet, Take 1 tablet (200 mg total) by mouth daily., Disp: 30 tablet, Rfl: 3 .  atorvastatin (LIPITOR) 40 MG tablet, Take 1 tablet (40 mg total) by mouth daily at 6 PM., Disp: 30 tablet, Rfl: 1 .  Blood Glucose Monitoring Suppl (ACCU-CHEK AVIVA) device, Use as instructed1 times daily before meals, Disp: 1 each, Rfl: 0 .  digoxin (LANOXIN) 0.125 MG tablet, Take 0.5 tablets (0.0625 mg total) by mouth daily., Disp: 15 tablet, Rfl: 3 .  ferrous sulfate 325 (65 FE) MG tablet, TAKE 1 TABLET BY MOUTH 3 TIMES DAILY WITH MEALS., Disp: 90 tablet, Rfl: 2 .  glipiZIDE (GLUCOTROL) 10 MG tablet, TAKE 1 TABLET BY MOUTH 2 TIMES DAILY BEFORE A MEAL., Disp: 60 tablet, Rfl: 2 .  glucose blood (ACCU-CHEK AVIVA) test strip, Use as instructed for 1 times daily testing of blood sugar, Disp: 100 each, Rfl: 12 .  hydrALAZINE (APRESOLINE) 25 MG tablet, Take 1 tablet (25 mg total) by mouth 3 (three) times daily., Disp: 90 tablet, Rfl: 3 .  Insulin Glargine (LANTUS) 100 UNIT/ML Solostar Pen, Inject 15 Units into the skin daily at 10 pm., Disp: 15 mL, Rfl: 5 .  Insulin Pen Needle 31G X 5 MM MISC, Use as directed for once daily insulin injection, Disp: 100 each, Rfl: 2 .  isosorbide mononitrate (IMDUR) 30 MG 24 hr tablet, Take 1 tablet (30 mg total) by mouth daily., Disp: 30 tablet, Rfl: 3 .  Lancet Devices (ACCU-CHEK SOFTCLIX) lancets, Use as instructed for once daily testing of blood sugar, Disp: 1 each, Rfl: 0 .  rivaroxaban (XARELTO) 20 MG TABS tablet, Take 1 tablet (20 mg total) by mouth daily before supper.,  Disp: 30 tablet, Rfl: 3 .  spironolactone (ALDACTONE) 25 MG tablet, Take 25 mg by mouth daily., Disp: , Rfl:  .  torsemide (DEMADEX) 20 MG tablet, Take 4 tablets (80 mg total) by mouth 2 (two) times daily., Disp: 240 tablet, Rfl: 3 .  gabapentin (NEURONTIN) 300 MG capsule, Take 1 capsule (300 mg total) by mouth 2 (two) times daily., Disp: 60 capsule, Rfl: 5 .  lactulose (CHRONULAC) 10 GM/15ML solution, Take 15 mLs (10 g total) by mouth daily as needed for mild constipation., Disp: 240 mL, Rfl: 0 .  meclizine (ANTIVERT) 25 MG tablet, Take 25 mg by mouth 2 (two) times daily as needed for nausea., Disp: , Rfl:  Allergies  Allergen Reactions  . Tape Itching    Paper tape please.     Social History   Social History  . Marital status: Divorced    Spouse name: N/A  . Number of children: 3  . Years of education: N/A   Occupational History  . disabled    Social History Main Topics  . Smoking status: Current Every Day Smoker    Packs/day: 1.00    Years: 40.00    Types: Cigarettes  . Smokeless tobacco: Never Used  . Alcohol use No  . Drug use: No  . Sexual activity: No   Other Topics Concern  . Not on file   Social History Narrative   Has an apartment with a roommate. He was living on the streets in 01-27-2013.  He reports that his father died in [place] in Jan 27, 2013.  He is divorced.  He is no longer estranged from his son, but still from his daughter who lives locally.  Neither of his parents, nor any siblings have any history of CAD.    Physical Exam      SAFE - 05/07/16 1000      Situation   Admitting diagnosis CHF   Heart failure history Exisiting   Comorbidities Atrial fibillation;DM   Readmitted within 30 days No   Hospital admission within past 12 months Yes   number of hospital admissions 1   number of ED visits 1   Target Weight 225 lbs     Assessment   Lives alone Yes   Primary support person YES   Mode of transportation personal car   Other services  involved Other  HOME HEALTH AIDE([employer])   Home equipement Scale     Weight   Weighs self daily Yes   Scale provided Yes   Records on weight chart No     Resources   Has "Living better w/heart failure" book Yes   Has HF Zone tool Yes   Able to identify yellow zone signs/when to call MD Yes   Records zone daily No     Medications   Uses a pill box Yes   Who stocks the pill box [facility]   Pill box checked this visit Yes   Pill box refilled this visit  Yes   Difficulty obtaining medications No   Mail order medications No   Missed one or more doses of medications per week Yes   How many missed doses this week 6     Nutrition   Patient receives meals on wheels No   Patient follows low sodium diet Yes   Has foods at home that meet the current recommended diet Yes   Patient follows low sugar/card diet Yes   Nutritional concerns/issues PT EATS PIZZA, [place](WEEKLY)     Activity Level   ADL's/Mobility Independent   How many feet can patient ambulate 300   Typical activity level INDEPENTANT   Barriers LANGUAGE   Actvity tolerance: NYHA Class 4     Urine   Difficulty urinating No   Changes in urine None     Time spent with patient   Time spent with patient  60 Minutes        Future Appointments Date Time Provider Department Center  05/30/2016 1:30 PM [facility] PA/NP [facility] None  05/31/2016 1:30 PM [facility]  06/05/2016 10:30 AM Jaclyn Shaggy, MD [facility] None   ATF pt CAO x4 laying in his bed waiting for his home aide to come.  Pt denies, dizziness, headache, and chest pain.  Pt stated that he is still working with his son. Pt missed both afternoon and evening medications yesterday, pt stated that he forgot. Pt ate baked fish and rice yesterday for dinner, his aide cooked for him.  Pt's home aide assists him with cooking, cleaning and she runs some errands for him.  She is aware of his low sodium diet.  Pt has not picked up his refills since last  visit. Pt doesn't have gabapentin. rx bottles and pill box verified.  Refills called in last visit but wasn't picked up: ferrous ( filled til afternoon tues), gabepentin, glipizide filled until mon morning (no pills in Monday)   ..the patient stated that he will pick them up tomorrow prior to heart failure visit .   ** spiro [id number] called in today(all slots filled will need for next visit) cbg 148  BP 102/60 (BP Location: Right Arm, Patient Position: Sitting, Cuff Size: Normal)   Pulse 92   Wt 225 lb (102.1 kg)   SpO2 99%   BMI 37.44 kg/m   Weight yesterday-226 Last visit weight-231    ACTION: Home visit completed

## 2016-05-30 ENCOUNTER — Ambulatory Visit (HOSPITAL_COMMUNITY)
Admission: RE | Admit: 2016-05-30 | Discharge: 2016-05-30 | Disposition: A | Discharge status: Home / Self Care | Payer: Medicare Other | Source: Ambulatory Visit | Attending: Cardiology | Admitting: Cardiology

## 2016-05-30 ENCOUNTER — Other Ambulatory Visit (HOSPITAL_COMMUNITY): Payer: Self-pay

## 2016-05-30 VITALS — BP 96/52 | HR 92 | Wt 230.6 lb

## 2016-05-30 DIAGNOSIS — I110 Hypertensive heart disease with heart failure: Secondary | ICD-10-CM | POA: Diagnosis not present

## 2016-05-30 DIAGNOSIS — I5042 Chronic combined systolic (congestive) and diastolic (congestive) heart failure: Secondary | ICD-10-CM

## 2016-05-30 DIAGNOSIS — Z833 Family history of diabetes mellitus: Secondary | ICD-10-CM | POA: Insufficient documentation

## 2016-05-30 DIAGNOSIS — I4891 Unspecified atrial fibrillation: Secondary | ICD-10-CM

## 2016-05-30 DIAGNOSIS — I252 Old myocardial infarction: Secondary | ICD-10-CM | POA: Diagnosis not present

## 2016-05-30 DIAGNOSIS — I255 Ischemic cardiomyopathy: Secondary | ICD-10-CM | POA: Diagnosis not present

## 2016-05-30 DIAGNOSIS — Z951 Presence of aortocoronary bypass graft: Secondary | ICD-10-CM | POA: Diagnosis not present

## 2016-05-30 DIAGNOSIS — I70209 Unspecified atherosclerosis of native arteries of extremities, unspecified extremity: Secondary | ICD-10-CM | POA: Diagnosis not present

## 2016-05-30 DIAGNOSIS — Z9114 Patient's other noncompliance with medication regimen: Secondary | ICD-10-CM | POA: Diagnosis not present

## 2016-05-30 DIAGNOSIS — E1151 Type 2 diabetes mellitus with diabetic peripheral angiopathy without gangrene: Secondary | ICD-10-CM | POA: Insufficient documentation

## 2016-05-30 DIAGNOSIS — I482 Chronic atrial fibrillation: Secondary | ICD-10-CM | POA: Insufficient documentation

## 2016-05-30 DIAGNOSIS — Z9119 Patient's noncompliance with other medical treatment and regimen: Secondary | ICD-10-CM

## 2016-05-30 DIAGNOSIS — Z720 Tobacco use: Secondary | ICD-10-CM | POA: Diagnosis not present

## 2016-05-30 DIAGNOSIS — Z7901 Long term (current) use of anticoagulants: Secondary | ICD-10-CM | POA: Insufficient documentation

## 2016-05-30 DIAGNOSIS — F17210 Nicotine dependence, cigarettes, uncomplicated: Secondary | ICD-10-CM | POA: Insufficient documentation

## 2016-05-30 DIAGNOSIS — Z95810 Presence of automatic (implantable) cardiac defibrillator: Secondary | ICD-10-CM | POA: Insufficient documentation

## 2016-05-30 DIAGNOSIS — E7800 Pure hypercholesterolemia, unspecified: Secondary | ICD-10-CM | POA: Diagnosis not present

## 2016-05-30 DIAGNOSIS — Z794 Long term (current) use of insulin: Secondary | ICD-10-CM | POA: Diagnosis not present

## 2016-05-30 DIAGNOSIS — Z9889 Other specified postprocedural states: Secondary | ICD-10-CM | POA: Insufficient documentation

## 2016-05-30 DIAGNOSIS — Z91199 Patient's noncompliance with other medical treatment and regimen due to unspecified reason: Secondary | ICD-10-CM

## 2016-05-30 DIAGNOSIS — I5022 Chronic systolic (congestive) heart failure: Secondary | ICD-10-CM | POA: Diagnosis not present

## 2016-05-30 DIAGNOSIS — I10 Essential (primary) hypertension: Secondary | ICD-10-CM | POA: Diagnosis not present

## 2016-05-30 DIAGNOSIS — I2510 Atherosclerotic heart disease of native coronary artery without angina pectoris: Secondary | ICD-10-CM | POA: Diagnosis not present

## 2016-05-30 LAB — COMPREHENSIVE METABOLIC PANEL
ALT: 17 U/L (ref 17–63)
ANION GAP: 9 (ref 5–15)
AST: 20 U/L (ref 15–41)
Albumin: 4 g/dL (ref 3.5–5.0)
Alkaline Phosphatase: 113 U/L (ref 38–126)
BILIRUBIN TOTAL: 0.8 mg/dL (ref 0.3–1.2)
BUN: 31 mg/dL — AB (ref 6–20)
CO2: 29 mmol/L (ref 22–32)
Calcium: 9.2 mg/dL (ref 8.9–10.3)
Chloride: 96 mmol/L — ABNORMAL LOW (ref 101–111)
Creatinine, Ser: 1.29 mg/dL — ABNORMAL HIGH (ref 0.61–1.24)
GFR, EST NON AFRICAN AMERICAN: 59 mL/min — AB (ref >60)
Glucose, Bld: 244 mg/dL — ABNORMAL HIGH (ref 65–99)
POTASSIUM: 4.1 mmol/L (ref 3.5–5.1)
Sodium: 134 mmol/L — ABNORMAL LOW (ref 135–145)
TOTAL PROTEIN: 8.1 g/dL (ref 6.5–8.1)

## 2016-05-30 LAB — TSH: TSH: 2.445 u[IU]/mL (ref 0.350–4.500)

## 2016-05-30 NOTE — Progress Notes (Signed)
Patient ID: Jacob [name], male   DOB: December 10, 1955, 61 y.o.   MRN: [id number]    Advanced Heart Failure Clinic Note   PCP: [facility] Primary [facility] Cardiologist: Dr Gala Romney   HPI: Jacob [name] is a 61 year old [place] male with history of systolic HF EF 20-25% via Echo 08/03/14 s/p INCEPTA ICD implant 6/14, RV dysfunction, CAD s/p CABG x 2 w/ RF MAZE 7/14 at [facility], DM type II, Chronic afib on xarelto, GI bleed 11/28/2014, and HLD.   Admitted the end of August 2016  with increased dyspnea on exertion. Later found to be in cardiogenic shock . At one point on dual pressors milrinone and norepi. Diuresed with IV lasix and transitioned to po lasix. Hospital course complicated by GI bleed and A fib RVR. Loaded on amio but later placed on toprol xl for rate control. On 9/5 had EGD with duodenal bleed with clip applied. He was discharged 9/12 with D/C weight 203 pounds.   Admitted late December 2016 with increased dyspnea. Diuresed with IV lasix and transitioned to po lasix 80 mg twice a day. Chronic A fib and continued on amio 200 mg twice a day. Discharge weight was 205 pounds.   Admitted January 17, 2023 through 01/12/16 with marked volume overload. Diuresed with IV lasix and milrinone. D/C weight 209 pounds. He was provided medications at discharge.   Pt presents today for regular follow up. Weight stable from last visit. Is missing afternoon doses of his medication most days a week.  Took his am medications today. Hasn't been taking torsemide or hydralazine second doses.  Smoking 1/2 pack a day or more. Sleeps on 2 pillows chronically. Weight at home 226 lbs. Still gets tired after walking about 1/4 mile. Pt states he watches his fluid and salt intake.  Has vascular US tomorrow. R thumb has been hurting. Not red or swollen. No injury.  Wonders if he injured it several months ago with a fall, but only started hurting recently.   SH: Lives in low income housing. Estranged from family ROS: All systems negative  except as listed in HPI, PMH and Problem List.  SH:  Social History   Social History  . Marital status: Divorced    Spouse name: N/A  . Number of children: 3  . Years of education: N/A   Occupational History  . disabled    Social History Main Topics  . Smoking status: Current Every Day Smoker    Packs/day: 1.00    Years: 40.00    Types: Cigarettes  . Smokeless tobacco: Never Used  . Alcohol use No  . Drug use: No  . Sexual activity: No   Other Topics Concern  . Not on file   Social History Narrative   Has an apartment with a roommate. He was living on the streets in January 16, 2013.  He reports that his father died in [place] in Jan 16, 2013.  He is divorced.  He is no longer estranged from his son, but still from his daughter who lives locally.  Neither of his parents, nor any siblings have any history of CAD.    FH:  Family History  Problem Relation Age of Onset  . Diabetes Mother     Past Medical History:  Diagnosis Date  . Atrial fibrillation (HCC)    RVR 10/2014  . Automatic implantable cardioverter-defibrillator in situ   . CAD (coronary artery disease) Sept 2013   s/p cardiac cath showing occlusion of small RCA with collaterals  .  CHF (congestive heart failure) (HCC)    20 to 25 % EF and RV dysfunction by 07/2014 echo   . Chronic anticoagulation    on xarelto.   . High cholesterol   . Hypertension   . Myocardial infarction 2014  . Noncompliance    homelessness contributing.   . Peripheral arterial disease (HCC)   . Type II diabetes mellitus (HCC)     Current Outpatient Prescriptions  Medication Sig Dispense Refill  . ACCU-CHEK SOFTCLIX LANCETS lancets Use for once daily testing of blood sugar 100 each 12  . amiodarone (PACERONE) 200 MG tablet Take 1 tablet (200 mg total) by mouth daily. 30 tablet 3  . atorvastatin (LIPITOR) 40 MG tablet Take 1 tablet (40 mg total) by mouth daily at 6 PM. 30 tablet 1  . Blood Glucose Monitoring Suppl (ACCU-CHEK AVIVA)  device Use as instructed1 times daily before meals 1 each 0  . digoxin (LANOXIN) 0.125 MG tablet Take 0.5 tablets (0.0625 mg total) by mouth daily. 15 tablet 3  . ferrous sulfate 325 (65 FE) MG tablet TAKE 1 TABLET BY MOUTH 3 TIMES DAILY WITH MEALS. 90 tablet 2  . gabapentin (NEURONTIN) 300 MG capsule Take 1 capsule (300 mg total) by mouth 2 (two) times daily. 60 capsule 5  . glucose blood (ACCU-CHEK AVIVA) test strip Use as instructed for 1 times daily testing of blood sugar 100 each 12  . hydrALAZINE (APRESOLINE) 25 MG tablet Take 1 tablet (25 mg total) by mouth 3 (three) times daily. 90 tablet 3  . Insulin Glargine (LANTUS) 100 UNIT/ML Solostar Pen Inject 15 Units into the skin daily at 10 pm. 15 mL 5  . Insulin Pen Needle 31G X 5 MM MISC Use as directed for once daily insulin injection 100 each 2  . isosorbide mononitrate (IMDUR) 30 MG 24 hr tablet Take 1 tablet (30 mg total) by mouth daily. 30 tablet 3  . lactulose (CHRONULAC) 10 GM/15ML solution Take 15 mLs (10 g total) by mouth daily as needed for mild constipation. 240 mL 0  . Lancet Devices (ACCU-CHEK SOFTCLIX) lancets Use as instructed for once daily testing of blood sugar 1 each 0  . rivaroxaban (XARELTO) 20 MG TABS tablet Take 1 tablet (20 mg total) by mouth daily before supper. 30 tablet 3  . spironolactone (ALDACTONE) 25 MG tablet Take 25 mg by mouth daily.    Marland Kitchen torsemide (DEMADEX) 20 MG tablet Take 4 tablets (80 mg total) by mouth 2 (two) times daily. 240 tablet 3  . albuterol (PROVENTIL HFA;VENTOLIN HFA) 108 (90 Base) MCG/ACT inhaler Inhale 2 puffs into the lungs every 6 (six) hours as needed for wheezing or shortness of breath. (Patient not taking: Reported on 05/30/2016) 1 Inhaler 2  . albuterol (PROVENTIL) (2.5 MG/3ML) 0.083% nebulizer solution Take 3 mLs (2.5 mg total) by nebulization every 6 (six) hours as needed for wheezing or shortness of breath. (Patient not taking: Reported on 05/30/2016) 150 mL 1  . glipiZIDE (GLUCOTROL) 10 MG  tablet TAKE 1 TABLET BY MOUTH 2 TIMES DAILY BEFORE A MEAL. (Patient not taking: Reported on 05/30/2016) 60 tablet 2  . meclizine (ANTIVERT) 25 MG tablet Take 25 mg by mouth 2 (two) times daily as needed for nausea.     No current facility-administered medications for this encounter.     Vitals:   05/30/16 1343  BP: (!) 96/52  Pulse: 92  SpO2: 98%  Weight: 230 lb 9.6 oz (104.6 kg)   Wt Readings from Last  3 Encounters:  05/30/16 230 lb 9.6 oz (104.6 kg)  05/29/16 225 lb (102.1 kg)  05/22/16 231 lb (104.8 kg)      PHYSICAL EXAM: General: Well appearing, NAD.    HEENT: Normal Neck: supple. JVP 8-9 cm. Carotids 2+ bilaterally; no bruits. No thyromegaly or nodule noted. Cor: PMI normal. Irregularly irregular. No M/G/R appreciated.  Lungs: Clear, normal effort.  Abdomen: soft, NT, ND, no HSM. No bruits or masses. +BS  Extremities: no cyanosis, clubbing, rash, Trace peripheral edema.  Neuro: alert & orientedx3, cranial nerves grossly intact. Moves all 4 extremities w/o difficulty. Affect pleasant.  ASSESSMENT & PLAN: 1.Chronic Systolic Heart Failure - ICM EF 20-25%. Has [employer] ICD.  NYHA II. Functional improvement.  - Volume status mildly elevated in setting of torsemide non-compliance.  - Continue torsemide 80 mg BID. Stressed importance of compliance. BMET today.   - No spiro due to non-compliance.  - No BB with h/o of recurrent cardiogenic shock.  - Continue hydralazine 25 mg TID. Stressed compliance.   - Continue imdur 30 mg daily.  - Continue dixogin 0.0625 mg daily.  Level 0.3 mg 05/02/16  - He is not a candidate for advanced therapies given social situation and non compliance.  - Reinforced fluid restriction to < 2 L daily, sodium restriction to less than 2000 mg daily, and the importance of daily weights.   2. Chronic A fib - Rate controlled. controlled. Continue amiodarone   Rate controlled. Continue Amio 200 mg twice a day,  Xarelto. No bleeding problems.  - Needs  yearly eye exams. No change to current plan. CMET and TSH today.  3. H/O GI Bleed - duodenal ulcer with clip 9/5. No bleeding problems.  4. PAD- Last ABI 2015  - R EIA stent with known bilateral SFA occlusion.  Repeat ABI ok. Dr Allyson Sabal recommends yearly ABI.   - No change to current plan. Gets [place] tomorrow.  5. Social  Issues - Resides in low income housing.  HFSW following.  - Continue paramedicine. 6. Smoking - Continues smoking > 1/2 ppd. Needs top stop completely.  7. HTN - Low in clinic but asymptomatic.  - Continue current meds.      Graciella Freer, PA-C  1:54 PM

## 2016-05-30 NOTE — Telephone Encounter (Signed)
MA informed Jacob [name] of PCP assessing the sypmtoms at the next appointment which is 06/05/16. No further questions at this time.

## 2016-05-30 NOTE — Telephone Encounter (Signed)
Will assess symptoms at his upcoming appointment and determine the need for it.

## 2016-05-30 NOTE — Progress Notes (Signed)
Paramedicine Encounter    Patient ID: Jacob [name], male    DOB: 1955-12-17, 61 y.o.   MRN: [id number]  ATF pt CAO x  Patient Care Team: [name], MD as PCP - General (Internal Medicine) Clarisa Schools, RN as Registered Nurse Corbin Ade, MD as Consulting Physician (Gastroenterology)  Patient Active Problem List   Diagnosis Date Noted  . Congestive heart disease (HCC) 01/01/2016  . Diabetic neuropathy (HCC) 11/28/2015  . Ascites 11/09/2015  . CHF (congestive heart failure) (HCC) 11/09/2015  . Insomnia 04/18/2015  . Claudication of right lower extremity (HCC) 04/07/2015  . Cardiomyopathy, ischemic 04/07/2015  . Chronic anticoagulation 03/30/2015  . Cardiorenal syndrome with renal failure   . SOB (shortness of breath)   . Morbid obesity due to excess calories (HCC)   . Helicobacter pylori ab+ 12/12/2014  . Calf pain   . Duodenal ulcer with hemorrhage   . Hemorrhagic shock   . Cardiogenic shock (HCC)   . Coronary artery disease involving native coronary artery of native heart without angina pectoris   . Atrial fibrillation with rapid ventricular response (HCC) 10/29/2014  . Chest pain   . Atherosclerosis of native arteries of extremity with intermittent claudication (HCC) 09/06/2014  . Atrial fibrillation with controlled ventricular response (HCC) 08/03/2014  . Bacteremia   . DM (diabetes mellitus), type 2 with peripheral vascular complications (HCC) 08/01/2014  . Anemia of chronic disease 08/01/2014  . PVD (peripheral vascular disease) (HCC) 10/29/2013  . Tobacco abuse 05/13/2013  . AF (atrial fibrillation) (HCC) 02/11/2013  . ICD - in place- BS May 2014 [facility] 02/11/2013  . Microcytic anemia 01/11/2013  . 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 Noncompliance 12/01/2011  . Essential hypertension 10/30/2006    Current Outpatient Prescriptions:  .  ACCU-CHEK SOFTCLIX LANCETS lancets, Use for once daily testing of blood sugar, Disp: 100 each, Rfl:  12 .  albuterol (PROVENTIL HFA;VENTOLIN HFA) 108 (90 Base) MCG/ACT inhaler, Inhale 2 puffs into the lungs every 6 (six) hours as needed for wheezing or shortness of breath. (Patient not taking: Reported on 05/30/2016), Disp: 1 Inhaler, Rfl: 2 .  albuterol (PROVENTIL) (2.5 MG/3ML) 0.083% nebulizer solution, Take 3 mLs (2.5 mg total) by nebulization every 6 (six) hours as needed for wheezing or shortness of breath. (Patient not taking: Reported on 05/30/2016), Disp: 150 mL, Rfl: 1 .  amiodarone (PACERONE) 200 MG tablet, Take 1 tablet (200 mg total) by mouth daily., Disp: 30 tablet, Rfl: 3 .  atorvastatin (LIPITOR) 40 MG tablet, Take 1 tablet (40 mg total) by mouth daily at 6 PM., Disp: 30 tablet, Rfl: 1 .  Blood Glucose Monitoring Suppl (ACCU-CHEK AVIVA) device, Use as instructed1 times daily before meals, Disp: 1 each, Rfl: 0 .  digoxin (LANOXIN) 0.125 MG tablet, Take 0.5 tablets (0.0625 mg total) by mouth daily., Disp: 15 tablet, Rfl: 3 .  ferrous sulfate 325 (65 FE) MG tablet, TAKE 1 TABLET BY MOUTH 3 TIMES DAILY WITH MEALS., Disp: 90 tablet, Rfl: 2 .  gabapentin (NEURONTIN) 300 MG capsule, Take 1 capsule (300 mg total) by mouth 2 (two) times daily., Disp: 60 capsule, Rfl: 5 .  glipiZIDE (GLUCOTROL) 10 MG tablet, TAKE 1 TABLET BY MOUTH 2 TIMES DAILY BEFORE A MEAL. (Patient not taking: Reported on 05/30/2016), Disp: 60 tablet, Rfl: 2 .  glucose blood (ACCU-CHEK AVIVA) test strip, Use as instructed for 1 times daily testing of blood sugar, Disp: 100 each, Rfl: 12 .  hydrALAZINE (APRESOLINE)  25 MG tablet, Take 1 tablet (25 mg total) by mouth 3 (three) times daily., Disp: 90 tablet, Rfl: 3 .  Insulin Glargine (LANTUS) 100 UNIT/ML Solostar Pen, Inject 15 Units into the skin daily at 10 pm., Disp: 15 mL, Rfl: 5 .  Insulin Pen Needle 31G X 5 MM MISC, Use as directed for once daily insulin injection, Disp: 100 each, Rfl: 2 .  isosorbide mononitrate (IMDUR) 30 MG 24 hr tablet, Take 1 tablet (30 mg total) by mouth  daily., Disp: 30 tablet, Rfl: 3 .  lactulose (CHRONULAC) 10 GM/15ML solution, Take 15 mLs (10 g total) by mouth daily as needed for mild constipation., Disp: 240 mL, Rfl: 0 .  Lancet Devices (ACCU-CHEK SOFTCLIX) lancets, Use as instructed for once daily testing of blood sugar, Disp: 1 each, Rfl: 0 .  meclizine (ANTIVERT) 25 MG tablet, Take 25 mg by mouth 2 (two) times daily as needed for nausea., Disp: , Rfl:  .  rivaroxaban (XARELTO) 20 MG TABS tablet, Take 1 tablet (20 mg total) by mouth daily before supper., Disp: 30 tablet, Rfl: 3 .  spironolactone (ALDACTONE) 25 MG tablet, Take 25 mg by mouth daily., Disp: , Rfl:  .  torsemide (DEMADEX) 20 MG tablet, Take 4 tablets (80 mg total) by mouth 2 (two) times daily., Disp: 240 tablet, Rfl: 3 Allergies  Allergen Reactions  . Tape Itching    Paper tape please.     Social History   Social History  . Marital status: Divorced    Spouse name: N/A  . Number of children: 3  . Years of education: N/A   Occupational History  . disabled    Social History Main Topics  . Smoking status: Current Every Day Smoker    Packs/day: 1.00    Years: 40.00    Types: Cigarettes  . Smokeless tobacco: Never Used  . Alcohol use No  . Drug use: No  . Sexual activity: No   Other Topics Concern  . Not on file   Social History Narrative   Has an apartment with a roommate. He was living on the streets in 17-Jan-2013.  He reports that his father died in [place] in January 17, 2013.  He is divorced.  He is no longer estranged from his son, but still from his daughter who lives locally.  Neither of his parents, nor any siblings have any history of CAD.    Physical Exam      SAFE - 05/07/16 1000      Situation   Admitting diagnosis CHF   Heart failure history Exisiting   Comorbidities Atrial fibillation;DM   Readmitted within 30 days No   Hospital admission within past 12 months Yes   number of hospital admissions 1   number of ED visits 1   [facility]  Weight 225 lbs     Assessment   Lives alone Yes   Primary support person YES   Mode of transportation personal car   Other services involved Other  HOME HEALTH AIDE([employer])   Home equipement Scale     Weight   Weighs self daily Yes   Scale provided Yes   Records on weight chart No     Resources   Has "Living better w/heart failure" book Yes   Has HF Zone tool Yes   Able to identify yellow zone signs/when to call MD Yes   Records zone daily No     Medications   Uses a pill box Yes  Who stocks the pill box [facility]   Pill box checked this visit Yes   Pill box refilled this visit Yes   Difficulty obtaining medications No   Mail order medications No   Missed one or more doses of medications per week Yes   How many missed doses this week 6     Nutrition   Patient receives meals on wheels No   Patient follows low sodium diet Yes   Has foods at home that meet the current recommended diet Yes   Patient follows low sugar/card diet Yes   Nutritional concerns/issues PT EATS PIZZA, [place](WEEKLY)     Activity Level   ADL's/Mobility Independent   How many feet can patient ambulate 300   Typical activity level INDEPENTANT   Barriers LANGUAGE   Actvity tolerance: NYHA Class 4     Urine   Difficulty urinating No   Changes in urine None     Time spent with patient   Time spent with patient  60 Minutes        Future Appointments Date Time Provider Department Center  05/31/2016 1:30 PM [facility]  06/05/2016 10:30 AM Jaclyn Shaggy, MD [facility] None   ATF pt CAO x4 with complaining of right thumb pain.  Pt stated that he had trouble sleeping last night and he has been in the bed all day today.  Pt denies sob, dizziness, headaches, and chest pain.   Pt's weight at home was 226 but in clinic 230. The following medications were picked up by [facility] for pt: glipizide, gabapentin, ferrous, and spiro  Andy Pt reports that he takes his afternoon  medications sometimes. Advised pt to take tylenol for the thumb pain.  He advised pt to continue taking medications as prescribed. Blood work today and return in a month.    BP (!) 96/52 (BP Location: Right Arm)   Weight yesterday-226 Last visit weight-231    ACTION: Home visit completed

## 2016-05-30 NOTE — Patient Instructions (Signed)
Routine lab work today. Will notify you of abnormal results, otherwise no news is good news!  Follow up with Amy Clegg NP-C in 2 months.  Do the following things EVERYDAY: 1) Weigh yourself in the morning before breakfast. Write it down and keep it in a log. 2) Take your medicines as prescribed 3) Eat low salt foods-Limit salt (sodium) to 2000 mg per day.  4) Stay as active as you can everyday 5) Limit all fluids for the day to less than 2 liters

## 2016-05-30 NOTE — Telephone Encounter (Signed)
MA spoke with Demetria and confirmed that the patients Protonix was discontinued last year. MA will speak with PCP and ask if the patient should be taking a daily dose or PRN.

## 2016-05-30 NOTE — Progress Notes (Signed)
Advanced Heart Failure Medication Review by a Pharmacist  Does the patient  feel that his/her medications are working for him/her?  yes  Has the patient been experiencing any side effects to the medications prescribed?  no  Does the patient measure his/her own blood pressure or blood glucose at home?  yes   Does the patient have any problems obtaining medications due to transportation or finances?   no  Understanding of regimen: fair Understanding of indications: fair Potential of compliance: fair Patient understands to avoid NSAIDs. Patient understands to avoid decongestants.  Issues to address at subsequent visits: None   Pharmacist comments: Jacob [name] is a pleasant 17 yoM presenting to advanced HF clinic, accompanied by his paramedic, Jacob [name]. He reports missing some of his afternoon medications, and Jacob [name] states that he misses them more often than taking them. Afternoon meds include his 2nd dose of torsemide. He had no other questions or complaints.   Allie Bossier, PharmD PGY1 Pharmacy Resident [phone] (Pager) 05/30/2016 1:54 PM  Time with patient: 10 min Preparation and documentation time: 2 min Total time: 12 min

## 2016-05-30 NOTE — Progress Notes (Signed)
Paramedicine Encounter    Patient ID: Jacob [name], male    DOB: [birth year], 61 y.o.   MRN: [id number]   Patient Care Team: [name], MD as PCP - General (Internal Medicine) Clarisa Schools, RN as Registered Nurse Corbin Ade, MD as Consulting Physician (Gastroenterology)  Patient Active Problem List   Diagnosis Date Noted  . Congestive heart disease (HCC) 01/01/2016  . Diabetic neuropathy (HCC) 11/28/2015  . Ascites 11/09/2015  . CHF (congestive heart failure) (HCC) 11/09/2015  . Insomnia 04/18/2015  . Claudication of right lower extremity (HCC) 04/07/2015  . Cardiomyopathy, ischemic 04/07/2015  . Chronic anticoagulation 03/30/2015  . Cardiorenal syndrome with renal failure   . SOB (shortness of breath)   . Morbid obesity due to excess calories (HCC)   . Helicobacter pylori ab+ 12/12/2014  . Calf pain   . Duodenal ulcer with hemorrhage   . Hemorrhagic shock   . Cardiogenic shock (HCC)   . Coronary artery disease involving native coronary artery of native heart without angina pectoris   . Atrial fibrillation with rapid ventricular response (HCC) 10/29/2014  . Chest pain   . Atherosclerosis of native arteries of extremity with intermittent claudication (HCC) 09/06/2014  . Atrial fibrillation with controlled ventricular response (HCC) 08/03/2014  . Bacteremia   . DM (diabetes mellitus), type 2 with peripheral vascular complications (HCC) 08/01/2014  . Anemia of chronic disease 08/01/2014  . PVD (peripheral vascular disease) (HCC) 10/29/2013  . Tobacco abuse 05/13/2013  . AF (atrial fibrillation) (HCC) 02/11/2013  . ICD - in place- BS May 2014 [facility] 02/11/2013  . Microcytic anemia 01/11/2013  . 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 Noncompliance 12/01/2011  . Essential hypertension 10/30/2006    Current Outpatient Prescriptions:  .  ACCU-CHEK SOFTCLIX LANCETS lancets, Use for once daily testing of blood sugar, Disp: 100 each, Rfl: 12 .   albuterol (PROVENTIL HFA;VENTOLIN HFA) 108 (90 Base) MCG/ACT inhaler, Inhale 2 puffs into the lungs every 6 (six) hours as needed for wheezing or shortness of breath., Disp: 1 Inhaler, Rfl: 2 .  albuterol (PROVENTIL) (2.5 MG/3ML) 0.083% nebulizer solution, Take 3 mLs (2.5 mg total) by nebulization every 6 (six) hours as needed for wheezing or shortness of breath., Disp: 150 mL, Rfl: 1 .  amiodarone (PACERONE) 200 MG tablet, Take 1 tablet (200 mg total) by mouth daily., Disp: 30 tablet, Rfl: 3 .  atorvastatin (LIPITOR) 40 MG tablet, Take 1 tablet (40 mg total) by mouth daily at 6 PM., Disp: 30 tablet, Rfl: 1 .  Blood Glucose Monitoring Suppl (ACCU-CHEK AVIVA) device, Use as instructed1 times daily before meals, Disp: 1 each, Rfl: 0 .  digoxin (LANOXIN) 0.125 MG tablet, Take 0.5 tablets (0.0625 mg total) by mouth daily., Disp: 15 tablet, Rfl: 3 .  ferrous sulfate 325 (65 FE) MG tablet, TAKE 1 TABLET BY MOUTH 3 TIMES DAILY WITH MEALS., Disp: 90 tablet, Rfl: 2 .  gabapentin (NEURONTIN) 300 MG capsule, Take 1 capsule (300 mg total) by mouth 2 (two) times daily., Disp: 60 capsule, Rfl: 5 .  glipiZIDE (GLUCOTROL) 10 MG tablet, TAKE 1 TABLET BY MOUTH 2 TIMES DAILY BEFORE A MEAL., Disp: 60 tablet, Rfl: 2 .  glucose blood (ACCU-CHEK AVIVA) test strip, Use as instructed for 1 times daily testing of blood sugar, Disp: 100 each, Rfl: 12 .  hydrALAZINE (APRESOLINE) 25 MG tablet, Take 1 tablet (25 mg total) by mouth 3 (three) times daily., Disp: 90 tablet, Rfl: 3 .  Insulin Glargine (LANTUS) 100 UNIT/ML Solostar Pen, Inject 15 Units into the skin daily at 10 pm., Disp: 15 mL, Rfl: 5 .  Insulin Pen Needle 31G X 5 MM MISC, Use as directed for once daily insulin injection, Disp: 100 each, Rfl: 2 .  isosorbide mononitrate (IMDUR) 30 MG 24 hr tablet, Take 1 tablet (30 mg total) by mouth daily., Disp: 30 tablet, Rfl: 3 .  lactulose (CHRONULAC) 10 GM/15ML solution, Take 15 mLs (10 g total) by mouth daily as needed for mild  constipation., Disp: 240 mL, Rfl: 0 .  Lancet Devices (ACCU-CHEK SOFTCLIX) lancets, Use as instructed for once daily testing of blood sugar, Disp: 1 each, Rfl: 0 .  meclizine (ANTIVERT) 25 MG tablet, Take 25 mg by mouth 2 (two) times daily as needed for nausea., Disp: , Rfl:  .  rivaroxaban (XARELTO) 20 MG TABS tablet, Take 1 tablet (20 mg total) by mouth daily before supper., Disp: 30 tablet, Rfl: 3 .  spironolactone (ALDACTONE) 25 MG tablet, Take 25 mg by mouth daily., Disp: , Rfl:  .  torsemide (DEMADEX) 20 MG tablet, Take 4 tablets (80 mg total) by mouth 2 (two) times daily., Disp: 240 tablet, Rfl: 3 Allergies  Allergen Reactions  . Tape Itching    Paper tape please.     Social History   Social History  . Marital status: Divorced    Spouse name: N/A  . Number of children: 3  . Years of education: N/A   Occupational History  . disabled    Social History Main Topics  . Smoking status: Current Every Day Smoker    Packs/day: 1.00    Years: 40.00    Types: Cigarettes  . Smokeless tobacco: Never Used  . Alcohol use No  . Drug use: No  . Sexual activity: No   Other Topics Concern  . Not on file   Social History Narrative   Has an apartment with a roommate. He was living on the streets in 01/30/2013.  He reports that his father died in [place] in 30-Jan-2013.  He is divorced.  He is no longer estranged from his son, but still from his daughter who lives locally.  Neither of his parents, nor any siblings have any history of CAD.    Physical Exam      SAFE - 05/07/16 1000      Situation   Admitting diagnosis CHF   Heart failure history Exisiting   Comorbidities Atrial fibillation;DM   Readmitted within 30 days No   Hospital admission within past 12 months Yes   number of hospital admissions 1   number of ED visits 1   Target Weight 225 lbs     Assessment   Lives alone Yes   Primary support person YES   Mode of transportation personal car   Other services  involved Other  HOME HEALTH AIDE([employer])   Home equipement Scale     Weight   Weighs self daily Yes   Scale provided Yes   Records on weight chart No     Resources   Has "Living better w/heart failure" book Yes   Has HF Zone tool Yes   Able to identify yellow zone signs/when to call MD Yes   Records zone daily No     Medications   Uses a pill box Yes   Who stocks the pill box [facility]   Pill box checked this visit Yes   Pill box refilled this visit  Yes   Difficulty obtaining medications No   Mail order medications No   Missed one or more doses of medications per week Yes   How many missed doses this week 6     Nutrition   Patient receives meals on wheels No   Patient follows low sodium diet Yes   Has foods at home that meet the current recommended diet Yes   Patient follows low sugar/card diet Yes   Nutritional concerns/issues PT EATS PIZZA, [place](WEEKLY)     Activity Level   ADL's/Mobility Independent   How many feet can patient ambulate 300   Typical activity level INDEPENTANT   Barriers LANGUAGE   Actvity tolerance: NYHA Class 4     Urine   Difficulty urinating No   Changes in urine None     Time spent with patient   Time spent with patient  60 Minutes        Future Appointments Date Time Provider Department Center  05/31/2016 1:30 PM [facility]  06/05/2016 10:30 AM Jaclyn Shaggy, MD [facility] None    There were no vitals taken for this visit.  Weight yesterday-226 Last visit weight-227    ACTION:

## 2016-05-31 ENCOUNTER — Inpatient Hospital Stay (HOSPITAL_COMMUNITY): Admission: RE | Admit: 2016-05-31 | Payer: Medicare Other | Source: Ambulatory Visit

## 2016-05-31 ENCOUNTER — Encounter (HOSPITAL_COMMUNITY): Payer: Self-pay | Admitting: Cardiovascular Disease

## 2016-06-05 ENCOUNTER — Ambulatory Visit: Payer: Medicare Other | Admitting: Family Medicine

## 2016-06-05 ENCOUNTER — Other Ambulatory Visit (HOSPITAL_COMMUNITY): Payer: Self-pay

## 2016-06-05 NOTE — Progress Notes (Signed)
Paramedicine Encounter    Patient ID: Jacob [name], male    DOB: [birth year], 61 y.o.   MRN: [id number]   Patient Care Team: [name], MD as PCP - General (Internal Medicine) Clarisa Schools, RN as Registered Nurse Corbin Ade, MD as Consulting Physician (Gastroenterology)  Patient Active Problem List   Diagnosis Date Noted  . Congestive heart disease (HCC) 01/01/2016  . Diabetic neuropathy (HCC) 11/28/2015  . Ascites 11/09/2015  . CHF (congestive heart failure) (HCC) 11/09/2015  . Insomnia 04/18/2015  . Claudication of right lower extremity (HCC) 04/07/2015  . Cardiomyopathy, ischemic 04/07/2015  . Chronic anticoagulation 03/30/2015  . Cardiorenal syndrome with renal failure   . SOB (shortness of breath)   . Morbid obesity due to excess calories (HCC)   . Helicobacter pylori ab+ 12/12/2014  . Calf pain   . Duodenal ulcer with hemorrhage   . Hemorrhagic shock   . Cardiogenic shock (HCC)   . Coronary artery disease involving native coronary artery of native heart without angina pectoris   . Atrial fibrillation with rapid ventricular response (HCC) 10/29/2014  . Chest pain   . Atherosclerosis of native arteries of extremity with intermittent claudication (HCC) 09/06/2014  . Atrial fibrillation with controlled ventricular response (HCC) 08/03/2014  . Bacteremia   . DM (diabetes mellitus), type 2 with peripheral vascular complications (HCC) 08/01/2014  . Anemia of chronic disease 08/01/2014  . PVD (peripheral vascular disease) (HCC) 10/29/2013  . Tobacco abuse 05/13/2013  . AF (atrial fibrillation) (HCC) 02/11/2013  . ICD - in place- BS May 2014 [facility] 02/11/2013  . Microcytic anemia 01/11/2013  . CAD- s/p CABG July 2014 [facility] 12/04/2011  . Noncompliance 12/01/2011  . Essential hypertension 10/30/2006    Current Outpatient Prescriptions:  .  ACCU-CHEK SOFTCLIX LANCETS lancets, Use for once daily testing of blood sugar, Disp: 100 each, Rfl: 12 .   albuterol (PROVENTIL HFA;VENTOLIN HFA) 108 (90 Base) MCG/ACT inhaler, Inhale 2 puffs into the lungs every 6 (six) hours as needed for wheezing or shortness of breath. (Patient not taking: Reported on 05/30/2016), Disp: 1 Inhaler, Rfl: 2 .  albuterol (PROVENTIL) (2.5 MG/3ML) 0.083% nebulizer solution, Take 3 mLs (2.5 mg total) by nebulization every 6 (six) hours as needed for wheezing or shortness of breath. (Patient not taking: Reported on 05/30/2016), Disp: 150 mL, Rfl: 1 .  amiodarone (PACERONE) 200 MG tablet, Take 1 tablet (200 mg total) by mouth daily., Disp: 30 tablet, Rfl: 3 .  atorvastatin (LIPITOR) 40 MG tablet, Take 1 tablet (40 mg total) by mouth daily at 6 PM., Disp: 30 tablet, Rfl: 1 .  Blood Glucose Monitoring Suppl (ACCU-CHEK AVIVA) device, Use as instructed1 times daily before meals, Disp: 1 each, Rfl: 0 .  digoxin (LANOXIN) 0.125 MG tablet, Take 0.5 tablets (0.0625 mg total) by mouth daily., Disp: 15 tablet, Rfl: 3 .  ferrous sulfate 325 (65 FE) MG tablet, TAKE 1 TABLET BY MOUTH 3 TIMES DAILY WITH MEALS., Disp: 90 tablet, Rfl: 2 .  gabapentin (NEURONTIN) 300 MG capsule, Take 1 capsule (300 mg total) by mouth 2 (two) times daily., Disp: 60 capsule, Rfl: 5 .  glipiZIDE (GLUCOTROL) 10 MG tablet, TAKE 1 TABLET BY MOUTH 2 TIMES DAILY BEFORE A MEAL. (Patient not taking: Reported on 05/30/2016), Disp: 60 tablet, Rfl: 2 .  glucose blood (ACCU-CHEK AVIVA) test strip, Use as instructed for 1 times daily testing of blood sugar, Disp: 100 each, Rfl: 12 .  hydrALAZINE (APRESOLINE) 25 MG tablet, Take  1 tablet (25 mg total) by mouth 3 (three) times daily., Disp: 90 tablet, Rfl: 3 .  Insulin Glargine (LANTUS) 100 UNIT/ML Solostar Pen, Inject 15 Units into the skin daily at 10 pm., Disp: 15 mL, Rfl: 5 .  Insulin Pen Needle 31G X 5 MM MISC, Use as directed for once daily insulin injection, Disp: 100 each, Rfl: 2 .  isosorbide mononitrate (IMDUR) 30 MG 24 hr tablet, Take 1 tablet (30 mg total) by mouth daily.,  Disp: 30 tablet, Rfl: 3 .  lactulose (CHRONULAC) 10 GM/15ML solution, Take 15 mLs (10 g total) by mouth daily as needed for mild constipation., Disp: 240 mL, Rfl: 0 .  Lancet Devices (ACCU-CHEK SOFTCLIX) lancets, Use as instructed for once daily testing of blood sugar, Disp: 1 each, Rfl: 0 .  meclizine (ANTIVERT) 25 MG tablet, Take 25 mg by mouth 2 (two) times daily as needed for nausea., Disp: , Rfl:  .  rivaroxaban (XARELTO) 20 MG TABS tablet, Take 1 tablet (20 mg total) by mouth daily before supper., Disp: 30 tablet, Rfl: 3 .  spironolactone (ALDACTONE) 25 MG tablet, Take 25 mg by mouth daily., Disp: , Rfl:  .  torsemide (DEMADEX) 20 MG tablet, Take 4 tablets (80 mg total) by mouth 2 (two) times daily., Disp: 240 tablet, Rfl: 3 Allergies  Allergen Reactions  . Tape Itching    Paper tape please.     Social History   Social History  . Marital status: Divorced    Spouse name: N/A  . Number of children: 3  . Years of education: N/A   Occupational History  . disabled    Social History Main Topics  . Smoking status: Current Every Day Smoker    Packs/day: 1.00    Years: 40.00    Types: Cigarettes  . Smokeless tobacco: Never Used  . Alcohol use No  . Drug use: No  . Sexual activity: No   Other Topics Concern  . Not on file   Social History Narrative   Has an apartment with a roommate. He was living on the streets in 2013-01-25.  He reports that his father died in [place] in January 25, 2013.  He is divorced.  He is no longer estranged from his son, but still from his daughter who lives locally.  Neither of his parents, nor any siblings have any history of CAD.    Physical Exam  Eyes: Pupils are equal, round, and reactive to light.  Pulmonary/Chest: No respiratory distress. He has no wheezes. He has no rales.  Abdominal: He exhibits no distension. There is no tenderness. There is no guarding.  Musculoskeletal: He exhibits no edema.  Skin: Skin is warm and dry. He is not  diaphoretic.        SAFE - 05/07/16 1000      Situation   Admitting diagnosis CHF   Heart failure history Exisiting   Comorbidities Atrial fibillation;DM   Readmitted within 30 days No   Hospital admission within past 12 months Yes   number of hospital admissions 1   number of ED visits 1   Target Weight 225 lbs     Assessment   Lives alone Yes   Primary support person YES   Mode of transportation personal car   Other services involved Other  HOME HEALTH AIDE([employer])   Home equipement Scale     Weight   Weighs self daily Yes   Scale provided Yes   Records on weight chart No  Resources   Has "Living better w/heart failure" book Yes   Has HF Zone tool Yes   Able to identify yellow zone signs/when to call MD Yes   Records zone daily No     Medications   Uses a pill box Yes   Who stocks the pill box [facility]   Pill box checked this visit Yes   Pill box refilled this visit Yes   Difficulty obtaining medications No   Mail order medications No   Missed one or more doses of medications per week Yes   How many missed doses this week 6     Nutrition   Patient receives meals on wheels No   Patient follows low sodium diet Yes   Has foods at home that meet the current recommended diet Yes   Patient follows low sugar/card diet Yes   Nutritional concerns/issues PT EATS PIZZA, [place](WEEKLY)     Activity Level   ADL's/Mobility Independent   How many feet can patient ambulate 300   Typical activity level INDEPENTANT   Barriers LANGUAGE   Actvity tolerance: NYHA Class 4     Urine   Difficulty urinating No   Changes in urine None     Time spent with patient   Time spent with patient  60 Minutes        Future Appointments Date Time Provider Department Center  06/06/2016 10:15 AM Jaclyn Shaggy, MD [facility] None  07/30/2016 2:00 PM [facility] PA/NP [facility] None   ATF pt CAO x4 sitting on his bed c/o right thumb and arm pain.  Pt stated that the pain is  worse than last week; his thumb is now swollen .  Pt had to reschedule his internal medicine appointment for tomorrow due to transportation issues.  Pt stated that his aide has changed due to unknown issues.  He still have a aide coming from the company, but she only cleans (no cooking).  Pt denies sob, dizziness, headache and chest pain.  rx bottles and pill box verified. Pt did not miss any medication doses within this last week.   cbg 116 BP 122/70 (BP Location: Right Arm, Patient Position: Sitting, Cuff Size: Normal)   Pulse 95   Resp 16   Wt 228 lb (103.4 kg)   SpO2 98%   BMI 37.94 kg/m   Weight yesterday-226 Last visit weight-226    ACTION: Home visit completed Next visit planned for next wednesday

## 2016-06-06 ENCOUNTER — Ambulatory Visit: Payer: Medicare Other | Attending: Family Medicine | Admitting: Family Medicine

## 2016-06-06 ENCOUNTER — Encounter: Payer: Self-pay | Admitting: Family Medicine

## 2016-06-06 VITALS — BP 112/76 | HR 102 | Temp 98.2°F | Ht 65.0 in | Wt 233.8 lb

## 2016-06-06 DIAGNOSIS — I110 Hypertensive heart disease with heart failure: Secondary | ICD-10-CM | POA: Insufficient documentation

## 2016-06-06 DIAGNOSIS — Z794 Long term (current) use of insulin: Secondary | ICD-10-CM | POA: Insufficient documentation

## 2016-06-06 DIAGNOSIS — I481 Persistent atrial fibrillation: Secondary | ICD-10-CM | POA: Diagnosis not present

## 2016-06-06 DIAGNOSIS — I5042 Chronic combined systolic (congestive) and diastolic (congestive) heart failure: Secondary | ICD-10-CM

## 2016-06-06 DIAGNOSIS — Z720 Tobacco use: Secondary | ICD-10-CM

## 2016-06-06 DIAGNOSIS — Z9889 Other specified postprocedural states: Secondary | ICD-10-CM | POA: Diagnosis not present

## 2016-06-06 DIAGNOSIS — I779 Disorder of arteries and arterioles, unspecified: Secondary | ICD-10-CM

## 2016-06-06 DIAGNOSIS — Z95810 Presence of automatic (implantable) cardiac defibrillator: Secondary | ICD-10-CM | POA: Diagnosis not present

## 2016-06-06 DIAGNOSIS — E1169 Type 2 diabetes mellitus with other specified complication: Secondary | ICD-10-CM | POA: Insufficient documentation

## 2016-06-06 DIAGNOSIS — M25511 Pain in right shoulder: Secondary | ICD-10-CM | POA: Diagnosis not present

## 2016-06-06 DIAGNOSIS — Z886 Allergy status to analgesic agent status: Secondary | ICD-10-CM | POA: Diagnosis not present

## 2016-06-06 DIAGNOSIS — F17200 Nicotine dependence, unspecified, uncomplicated: Secondary | ICD-10-CM | POA: Insufficient documentation

## 2016-06-06 DIAGNOSIS — I2510 Atherosclerotic heart disease of native coronary artery without angina pectoris: Secondary | ICD-10-CM | POA: Insufficient documentation

## 2016-06-06 DIAGNOSIS — E1149 Type 2 diabetes mellitus with other diabetic neurological complication: Secondary | ICD-10-CM

## 2016-06-06 DIAGNOSIS — Z955 Presence of coronary angioplasty implant and graft: Secondary | ICD-10-CM | POA: Diagnosis not present

## 2016-06-06 DIAGNOSIS — I252 Old myocardial infarction: Secondary | ICD-10-CM | POA: Diagnosis not present

## 2016-06-06 DIAGNOSIS — E1151 Type 2 diabetes mellitus with diabetic peripheral angiopathy without gangrene: Secondary | ICD-10-CM | POA: Diagnosis not present

## 2016-06-06 DIAGNOSIS — G8929 Other chronic pain: Secondary | ICD-10-CM | POA: Diagnosis not present

## 2016-06-06 DIAGNOSIS — Z590 Homelessness: Secondary | ICD-10-CM | POA: Insufficient documentation

## 2016-06-06 DIAGNOSIS — Z9109 Other allergy status, other than to drugs and biological substances: Secondary | ICD-10-CM | POA: Diagnosis not present

## 2016-06-06 DIAGNOSIS — Z9114 Patient's other noncompliance with medication regimen: Secondary | ICD-10-CM | POA: Diagnosis not present

## 2016-06-06 DIAGNOSIS — E7800 Pure hypercholesterolemia, unspecified: Secondary | ICD-10-CM | POA: Insufficient documentation

## 2016-06-06 DIAGNOSIS — Z951 Presence of aortocoronary bypass graft: Secondary | ICD-10-CM | POA: Diagnosis not present

## 2016-06-06 DIAGNOSIS — Z7901 Long term (current) use of anticoagulants: Secondary | ICD-10-CM | POA: Diagnosis not present

## 2016-06-06 DIAGNOSIS — I482 Chronic atrial fibrillation: Secondary | ICD-10-CM | POA: Diagnosis not present

## 2016-06-06 DIAGNOSIS — I4819 Other persistent atrial fibrillation: Secondary | ICD-10-CM

## 2016-06-06 DIAGNOSIS — M79644 Pain in right finger(s): Secondary | ICD-10-CM

## 2016-06-06 LAB — GLUCOSE, POCT (MANUAL RESULT ENTRY): POC Glucose: 95 mg/dl (ref 70–99)

## 2016-06-06 LAB — POCT GLYCOSYLATED HEMOGLOBIN (HGB A1C): Hemoglobin A1C: 7.9

## 2016-06-06 MED ORDER — PREDNISONE 20 MG PO TABS: 20.0000 mg | ORAL_TABLET | Freq: Every day | ORAL | 0 refills | Status: DC

## 2016-06-06 MED ORDER — ACETAMINOPHEN-CODEINE #3 300-30 MG PO TABS: 1.0000 | ORAL_TABLET | Freq: Two times a day (BID) | ORAL | 0 refills | Status: DC | PRN

## 2016-06-06 MED ORDER — GLIPIZIDE 10 MG PO TABS: ORAL_TABLET | ORAL | 2 refills | Status: DC

## 2016-06-06 MED ORDER — INSULIN GLARGINE 100 UNIT/ML SOLOSTAR PEN: 15.0000 [IU] | PEN_INJECTOR | Freq: Every day | SUBCUTANEOUS | 5 refills | Status: DC

## 2016-06-06 MED ORDER — GLIPIZIDE 10 MG PO TABS: ORAL_TABLET | ORAL | 5 refills | Status: DC

## 2016-06-06 MED FILL — ISOSORBIDE MN ER 30 MG TAB: 30 | 30 days supply | Qty: 30 | Fill #2

## 2016-06-06 MED FILL — predniSONE 20 MG TABS: 20 | 5 days supply | Qty: 5 | Fill #0

## 2016-06-06 MED FILL — hydrALAZINE HCL 25 MG TABS: 25 | 30 days supply | Qty: 45 | Fill #2

## 2016-06-06 MED FILL — LANTUS SOLOSTAR 100 UNITS/M: 100 | 25 days supply | Qty: 3 | Fill #5

## 2016-06-06 NOTE — Patient Instructions (Signed)
Arthritis Arthritis means joint pain. It can also mean joint disease. A joint is a place where bones come together. People who have arthritis may have:  Red joints.  Swollen joints.  Stiff joints.  Warm joints.  A fever.  A feeling of being sick. Follow these instructions at home: Pay attention to any changes in your symptoms. Take these actions to help with your pain and swelling. Medicines   Take over-the-counter and prescription medicines only as told by your doctor.  Do not take aspirin for pain if your doctor says that you may have gout. Activity   Rest your joint if your doctor tells you to.  Avoid activities that make the pain worse.  Exercise your joint regularly as told by your doctor. Try doing exercises like:  Swimming.  Water aerobics.  Biking.  Walking. Joint Care    If your joint is swollen, keep it raised (elevated) if told by your doctor.  If your joint feels stiff in the morning, try taking a warm shower.  If you have diabetes, do not apply heat without asking your doctor.  If told, apply heat to the joint:  Put a towel between the joint and the hot pack or heating pad.  Leave the heat on the area for 20-30 minutes.  If told, apply ice to the joint:  Put ice in a plastic bag.  Place a towel between your skin and the bag.  Leave the ice on for 20 minutes, 2-3 times per day.  Keep all follow-up visits as told by your doctor. Contact a doctor if:  The pain gets worse.  You have a fever. Get help right away if:  You have very bad pain in your joint.  You have swelling in your joint.  Your joint is red.  Many joints become painful and swollen.  You have very bad back pain.  Your leg is very weak.  You cannot control your pee (urine) or poop (stool). This information is not intended to replace advice given to you by your health care provider. Make sure you discuss any questions you have with your health care  provider. Document Released: 06/05/2009 Document Revised: 08/17/2015 Document Reviewed: 06/06/2014 [employer] Interactive Patient Education  2017 [employer]  

## 2016-06-06 NOTE — Progress Notes (Signed)
Subjective:  Patient ID: Jacob [name], male    DOB: 30-Nov-1955  Age: 61 y.o. MRN: [id number]  CC: Arm Pain (right arm); thumb pain (right side); Diabetes; Hyperlipidemia; and Hypertension   HPI Jacob [name] is a 61 year old male with a history of type 2 diabetes mellitus (A1c 7.9), chronic combined systolic and diastolic heart failure status post ICD (EF 25-30% from 10/2015), CAD s/p CABG with MAZE peripheral vascular disease, chronic atrial fibrillation (on anticoagulation with Xarelto) here for follow-up visit  He reports compliance with his medications and has been checking his weight daily-weight has been stable. He is currently under the paramedicine program and reports doing well.  Blood sugars at home have been in the 150-200 range as per patient. He denies hypoglycemia but does have some peripheral neuropathy which is controlled with gabapentin. He is yet to see an ophthalmologist or podiatrist.  Complains of right shoulder pain and reduced range of motion and associated right thumb pain and swelling for the last couple of weeks. Denies history of trauma.    Past Medical History:  Diagnosis Date  . Atrial fibrillation (HCC)    RVR 10/2014  . Automatic implantable cardioverter-defibrillator in situ   . CAD (coronary artery disease) Sept 2013   s/p cardiac cath showing occlusion of small RCA with collaterals  . CHF (congestive heart failure) (HCC)    20 to 25 % EF and RV dysfunction by 07/2014 echo   . Chronic anticoagulation    on xarelto.   . High cholesterol   . Hypertension   . Myocardial infarction 2014  . Noncompliance    homelessness contributing.   . Peripheral arterial disease (HCC)   . Type II diabetes mellitus (HCC)     Past Surgical History:  Procedure Laterality Date  . CARDIAC CATHETERIZATION  09/2012  . CORONARY ANGIOPLASTY WITH STENT PLACEMENT  11/2011   "1"  . CORONARY ARTERY BYPASS GRAFT  09/2012   2 vessels per patient [name]   .  ESOPHAGOGASTRODUODENOSCOPY N/A 11/28/2014   Procedure: ESOPHAGOGASTRODUODENOSCOPY (EGD);  Surgeon: Beverley Fiedler, MD;  Location: [facility] ENDOSCOPY;  Service: Endoscopy;  Laterality: N/A;  . ILIAC ARTERY STENT Right 08/30/2013  . IMPLANTABLE CARDIOVERTER DEFIBRILLATOR IMPLANT     [place] in 07/2012; [employer]  . LEFT AND RIGHT HEART CATHETERIZATION WITH CORONARY ANGIOGRAM N/A 12/02/2011   Procedure: LEFT AND RIGHT HEART CATHETERIZATION WITH CORONARY ANGIOGRAM;  Surgeon: Kathleene Hazel, MD;  Location: [facility] CATH LAB;  Service: Cardiovascular;  Laterality: N/A;  . LOWER EXTREMITY ANGIOGRAM N/A 08/30/2013   Procedure: LOWER EXTREMITY ANGIOGRAM;  Surgeon: Runell Gess, MD;  Location: [facility] CATH LAB;  Service: Cardiovascular;  Laterality: N/A;  . LOWER EXTREMITY ANGIOGRAM N/A 12/02/2013   Procedure: LOWER EXTREMITY ANGIOGRAM;  Surgeon: Runell Gess, MD;  Location: [facility] CATH LAB;  Service: Cardiovascular;  Laterality: N/A;    Allergies  Allergen Reactions  . Tylenol [Acetaminophen] Itching  . Tape Itching    Paper tape please.     Outpatient Medications Prior to Visit  Medication Sig Dispense Refill  . ACCU-CHEK SOFTCLIX LANCETS lancets Use for once daily testing of blood sugar 100 each 12  . albuterol (PROVENTIL HFA;VENTOLIN HFA) 108 (90 Base) MCG/ACT inhaler Inhale 2 puffs into the lungs every 6 (six) hours as needed for wheezing or shortness of breath. 1 Inhaler 2  . albuterol (PROVENTIL) (2.5 MG/3ML) 0.083% nebulizer solution Take 3 mLs (2.5 mg total) by nebulization every 6 (six) hours as needed for wheezing or shortness  of breath. 150 mL 1  . amiodarone (PACERONE) 200 MG tablet Take 1 tablet (200 mg total) by mouth daily. 30 tablet 3  . atorvastatin (LIPITOR) 40 MG tablet Take 1 tablet (40 mg total) by mouth daily at 6 PM. 30 tablet 1  . Blood Glucose Monitoring Suppl (ACCU-CHEK AVIVA) device Use as instructed1 times daily before meals 1 each 0  . digoxin (LANOXIN) 0.125 MG tablet Take 0.5  tablets (0.0625 mg total) by mouth daily. 15 tablet 3  . ferrous sulfate 325 (65 FE) MG tablet TAKE 1 TABLET BY MOUTH 3 TIMES DAILY WITH MEALS. 90 tablet 2  . gabapentin (NEURONTIN) 300 MG capsule Take 1 capsule (300 mg total) by mouth 2 (two) times daily. 60 capsule 5  . glucose blood (ACCU-CHEK AVIVA) test strip Use as instructed for 1 times daily testing of blood sugar 100 each 12  . hydrALAZINE (APRESOLINE) 25 MG tablet Take 1 tablet (25 mg total) by mouth 3 (three) times daily. 90 tablet 3  . Insulin Pen Needle 31G X 5 MM MISC Use as directed for once daily insulin injection 100 each 2  . isosorbide mononitrate (IMDUR) 30 MG 24 hr tablet Take 1 tablet (30 mg total) by mouth daily. 30 tablet 3  . lactulose (CHRONULAC) 10 GM/15ML solution Take 15 mLs (10 g total) by mouth daily as needed for mild constipation. 240 mL 0  . Lancet Devices (ACCU-CHEK SOFTCLIX) lancets Use as instructed for once daily testing of blood sugar 1 each 0  . rivaroxaban (XARELTO) 20 MG TABS tablet Take 1 tablet (20 mg total) by mouth daily before supper. 30 tablet 3  . spironolactone (ALDACTONE) 25 MG tablet Take 25 mg by mouth daily.    Marland Kitchen torsemide (DEMADEX) 20 MG tablet Take 4 tablets (80 mg total) by mouth 2 (two) times daily. 240 tablet 3  . glipiZIDE (GLUCOTROL) 10 MG tablet TAKE 1 TABLET BY MOUTH 2 TIMES DAILY BEFORE A MEAL. 60 tablet 2  . Insulin Glargine (LANTUS) 100 UNIT/ML Solostar Pen Inject 15 Units into the skin daily at 10 pm. 15 mL 5  . meclizine (ANTIVERT) 25 MG tablet Take 25 mg by mouth 2 (two) times daily as needed for nausea.     No facility-administered medications prior to visit.     ROS Review of Systems Constitutional: Negative for activity change and appetite change.  HENT: Negative for sinus pressure and sore throat.   Eyes: Negative for visual disturbance.  Respiratory: Negative for cough, chest tightness and shortness of breath.   Cardiovascular: Negative for chest pain and leg swelling.   Gastrointestinal: Negative for abdominal distention, abdominal pain, constipation and diarrhea.  Endocrine: Negative.   Genitourinary: Negative for dysuria.  Musculoskeletal: see hpi.  Skin: Negative for rash.  Allergic/Immunologic: Negative.   Neurological: Negative for weakness, light-headedness and numbness.  Psychiatric/Behavioral: Negative for dysphoric mood and suicidal ideas.  Objective:  BP 112/76 (BP Location: Right Arm, Patient Position: Sitting, Cuff Size: Large)   Pulse (!) 102   Temp 98.2 F (36.8 C) (Oral)   Ht 5\' 5"  (1.651 m)   Wt 233 lb 12.8 oz (106.1 kg)   SpO2 95%   BMI 38.91 kg/m   BP/Weight 06/06/2016 06/05/2016 05/30/2016  Systolic BP 112 122 96  Diastolic BP 76 70 52  Wt. (Lbs) 233.8 228 226  BMI 38.91 37.94 37.61      Physical Exam Constitutional: He is oriented to person, place, and time. He appears well-developed and well-nourished.  Neck: No JVD present.  Cardiovascular: Normal rate, normal heart sounds and intact distal pulses.   No murmur heard. Pulmonary/Chest: Effort normal and breath sounds normal. He has no wheezes. He has no rales. He exhibits no tenderness.  Abdominal: Soft. Bowel sounds are normal. He exhibits no distension and no mass. There is no tenderness.  Musculoskeletal: Slight edema and tenderness of  right MCP joint of the pump No tenderness to palpation of shoulder joint. Decreased range of motion in right shoulder-abduction restricted to 120; left shoulder is normal.  Neurological: He is alert and oriented to person, place, and time.  Skin: Skin is warm and dry.  Psychiatric: He has a normal mood and affect.   Lab Results  Component Value Date   HGBA1C 7.9 06/06/2016    Assessment & Plan:   1. DM (diabetes mellitus), type 2 with peripheral vascular complications (HCC) Controlled with A1c of 7.9 Continue diabetic diet and current medications. - Glucose (CBG) - HgB A1c - Ambulatory referral to Ophthalmology - Ambulatory  referral to Podiatry - Microalbumin / creatinine urine ratio  2. Tobacco abuse Spent 3 minutes counseling on cessation He is not willing to quit  3. Other diabetic neurological complication associated with type 2 diabetes mellitus (HCC) Continue gabapentin  4. Chronic combined systolic and diastolic congestive heart failure (HCC) EF 25-30% from 2-D echo of 10/2015 Euvolemic Weight has been stable Continue current medications Continue with current paramedicine program Keep appointment with heart failure clinic  5. Persistent atrial fibrillation (HCC) Stable Continue anticoagulation with Xarelto on rate control with metoprolol  6. Pain of right thumb Possibly underlying osteoarthritis Placed on short course prednisone  7. Chronic right shoulder pain Written prescription for Tylenol 3 as we are unable to place him on NSAIDs due to the fact that he is on anticoagulation.   Meds ordered this encounter  Medications  . Insulin Glargine (LANTUS) 100 UNIT/ML Solostar Pen    Sig: Inject 15 Units into the skin daily at 10 pm.    Dispense:  2 pen    Refill:  5    Discontinue previous dose  . glipiZIDE (GLUCOTROL) 10 MG tablet    Sig: TAKE 1 TABLET BY MOUTH 2 TIMES DAILY BEFORE A MEAL.    Dispense:  60 tablet    Refill:  5  . acetaminophen-codeine (TYLENOL #3) 300-30 MG tablet    Sig: Take 1 tablet by mouth every 12 (twelve) hours as needed for moderate pain.    Dispense:  60 tablet    Refill:  0  . predniSONE (DELTASONE) 20 MG tablet    Sig: Take 1 tablet (20 mg total) by mouth daily with breakfast.    Dispense:  5 tablet    Refill:  0    Follow-up: Return in about 6 weeks (around 07/18/2016) for follow up on diabetes.   Jaclyn Shaggy MD

## 2016-06-07 ENCOUNTER — Telehealth (HOSPITAL_COMMUNITY): Payer: Self-pay | Admitting: Internal Medicine

## 2016-06-07 MED FILL — ACETAMINOPHEN/COD #3 TABLET: 300-30 | 30 days supply | Qty: 60 | Fill #0

## 2016-06-07 NOTE — Telephone Encounter (Signed)
I called and spoke to patient and scheduled appointment for Cardiac Rehab program. Patient informed we will send information packet, packet needs to be completed and brought in on the day of appointment. Patient verbalized understanding.

## 2016-06-12 ENCOUNTER — Telehealth: Payer: Self-pay | Admitting: Internal Medicine

## 2016-06-12 ENCOUNTER — Other Ambulatory Visit (HOSPITAL_COMMUNITY): Payer: Self-pay

## 2016-06-12 NOTE — Telephone Encounter (Signed)
Patient called the office to speak with Jacob [name] regarding his assistance. Please follow up.  Thank you.

## 2016-06-12 NOTE — Progress Notes (Signed)
Paramedicine Encounter    Patient ID: Jacob [name], male    DOB: Jan 04, 1956, 61 y.o.   MRN: [id number]   Patient Care Team: [name], MD as PCP - General (Internal Medicine) Clarisa Schools, RN as Registered Nurse Corbin Ade, MD as Consulting Physician (Gastroenterology)  Patient Active Problem List   Diagnosis Date Noted  . Congestive heart disease (HCC) 01/01/2016  . Diabetic neuropathy (HCC) 11/28/2015  . Ascites 11/09/2015  . CHF (congestive heart failure) (HCC) 11/09/2015  . Insomnia 04/18/2015  . Claudication of right lower extremity (HCC) 04/07/2015  . Cardiomyopathy, ischemic 04/07/2015  . Chronic anticoagulation 03/30/2015  . Cardiorenal syndrome with renal failure   . SOB (shortness of breath)   . Morbid obesity due to excess calories (HCC)   . Helicobacter pylori ab+ 12/12/2014  . Calf pain   . Duodenal ulcer with hemorrhage   . Hemorrhagic shock   . Cardiogenic shock (HCC)   . Coronary artery disease involving native coronary artery of native heart without angina pectoris   . Atrial fibrillation with rapid ventricular response (HCC) 10/29/2014  . Chest pain   . Atherosclerosis of native arteries of extremity with intermittent claudication (HCC) 09/06/2014  . Atrial fibrillation with controlled ventricular response (HCC) 08/03/2014  . Bacteremia   . DM (diabetes mellitus), type 2 with peripheral vascular complications (HCC) 08/01/2014  . Anemia of chronic disease 08/01/2014  . PVD (peripheral vascular disease) (HCC) 10/29/2013  . Tobacco abuse 05/13/2013  . AF (atrial fibrillation) (HCC) 02/11/2013  . ICD - in place- BS May 2014 [facility] 02/11/2013  . Microcytic anemia 01/11/2013  . 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 Noncompliance 12/01/2011  . Essential hypertension 10/30/2006    Current Outpatient Prescriptions:  .  amiodarone (PACERONE) 200 MG tablet, Take 1 tablet (200 mg total) by mouth daily., Disp: 30 tablet, Rfl: 3 .   atorvastatin (LIPITOR) 40 MG tablet, Take 1 tablet (40 mg total) by mouth daily at 6 PM., Disp: 30 tablet, Rfl: 1 .  digoxin (LANOXIN) 0.125 MG tablet, Take 0.5 tablets (0.0625 mg total) by mouth daily., Disp: 15 tablet, Rfl: 3 .  ferrous sulfate 325 (65 FE) MG tablet, TAKE 1 TABLET BY MOUTH 3 TIMES DAILY WITH MEALS., Disp: 90 tablet, Rfl: 2 .  gabapentin (NEURONTIN) 300 MG capsule, Take 1 capsule (300 mg total) by mouth 2 (two) times daily., Disp: 60 capsule, Rfl: 5 .  glipiZIDE (GLUCOTROL) 10 MG tablet, TAKE 1 TABLET BY MOUTH 2 TIMES DAILY BEFORE A MEAL., Disp: 60 tablet, Rfl: 2 .  hydrALAZINE (APRESOLINE) 25 MG tablet, Take 1 tablet (25 mg total) by mouth 3 (three) times daily., Disp: 90 tablet, Rfl: 3 .  Insulin Glargine (LANTUS) 100 UNIT/ML Solostar Pen, Inject 15 Units into the skin daily at 10 pm., Disp: 2 pen, Rfl: 5 .  Insulin Pen Needle 31G X 5 MM MISC, Use as directed for once daily insulin injection, Disp: 100 each, Rfl: 2 .  isosorbide mononitrate (IMDUR) 30 MG 24 hr tablet, Take 1 tablet (30 mg total) by mouth daily., Disp: 30 tablet, Rfl: 3 .  Lancet Devices (ACCU-CHEK SOFTCLIX) lancets, Use as instructed for once daily testing of blood sugar, Disp: 1 each, Rfl: 0 .  rivaroxaban (XARELTO) 20 MG TABS tablet, Take 1 tablet (20 mg total) by mouth daily before supper., Disp: 30 tablet, Rfl: 3 .  spironolactone (ALDACTONE) 25 MG tablet, Take 25 mg by mouth daily., Disp: , Rfl:  .  ACCU-CHEK SOFTCLIX LANCETS lancets, Use for once daily testing of blood sugar, Disp: 100 each, Rfl: 12 .  acetaminophen-codeine (TYLENOL #3) 300-30 MG tablet, Take 1 tablet by mouth every 12 (twelve) hours as needed for moderate pain., Disp: 60 tablet, Rfl: 0 .  albuterol (PROVENTIL HFA;VENTOLIN HFA) 108 (90 Base) MCG/ACT inhaler, Inhale 2 puffs into the lungs every 6 (six) hours as needed for wheezing or shortness of breath., Disp: 1 Inhaler, Rfl: 2 .  albuterol (PROVENTIL) (2.5 MG/3ML) 0.083% nebulizer solution,  Take 3 mLs (2.5 mg total) by nebulization every 6 (six) hours as needed for wheezing or shortness of breath., Disp: 150 mL, Rfl: 1 .  Blood Glucose Monitoring Suppl (ACCU-CHEK AVIVA) device, Use as instructed1 times daily before meals, Disp: 1 each, Rfl: 0 .  glucose blood (ACCU-CHEK AVIVA) test strip, Use as instructed for 1 times daily testing of blood sugar, Disp: 100 each, Rfl: 12 .  lactulose (CHRONULAC) 10 GM/15ML solution, Take 15 mLs (10 g total) by mouth daily as needed for mild constipation., Disp: 240 mL, Rfl: 0 .  meclizine (ANTIVERT) 25 MG tablet, Take 25 mg by mouth 2 (two) times daily as needed for nausea., Disp: , Rfl:  .  predniSONE (DELTASONE) 20 MG tablet, Take 1 tablet (20 mg total) by mouth daily with breakfast. (Patient not taking: Reported on 06/12/2016), Disp: 5 tablet, Rfl: 0 .  torsemide (DEMADEX) 20 MG tablet, Take 4 tablets (80 mg total) by mouth 2 (two) times daily., Disp: 240 tablet, Rfl: 3 Allergies  Allergen Reactions  . Tylenol [Acetaminophen] Itching  . Tape Itching    Paper tape please.     Social History   Social History  . Marital status: Divorced    Spouse name: N/A  . Number of children: 3  . Years of education: N/A   Occupational History  . disabled    Social History Main Topics  . Smoking status: Current Every Day Smoker    Packs/day: 0.50    Years: 40.00    Types: Cigarettes  . Smokeless tobacco: Never Used  . Alcohol use No  . Drug use: No  . Sexual activity: No   Other Topics Concern  . Not on file   Social History Narrative   Has an apartment with a roommate. He was living on the streets in 01-28-2013.  He reports that his father died in [place] in January 28, 2013.  He is divorced.  He is no longer estranged from his son, but still from his daughter who lives locally.  Neither of his parents, nor any siblings have any history of CAD.    Physical Exam  Eyes: Pupils are equal, round, and reactive to light.  Pulmonary/Chest: No  respiratory distress. He has no wheezes. He has no rales.  Abdominal: He exhibits no distension. There is no tenderness. There is no guarding.  Musculoskeletal: He exhibits no edema.  Skin: Skin is warm and dry. He is not diaphoretic.        SAFE - 05/07/16 1000      Situation   Admitting diagnosis CHF   Heart failure history Exisiting   Comorbidities Atrial fibillation;DM   Readmitted within 30 days No   Hospital admission within past 12 months Yes   number of hospital admissions 1   number of ED visits 1   Target Weight 225 lbs     Assessment   Lives alone Yes   Primary support person YES   Mode of transportation personal  car   Other services involved Other  HOME HEALTH AIDE([employer])   Home equipement Scale     Weight   Weighs self daily Yes   Scale provided Yes   Records on weight chart No     Resources   Has "Living better w/heart failure" book Yes   Has HF Zone tool Yes   Able to identify yellow zone signs/when to call MD Yes   Records zone daily No     Medications   Uses a pill box Yes   Who stocks the pill box [facility]   Pill box checked this visit Yes   Pill box refilled this visit Yes   Difficulty obtaining medications No   Mail order medications No   Missed one or more doses of medications per week Yes   How many missed doses this week 6     Nutrition   Patient receives meals on wheels No   Patient follows low sodium diet Yes   Has foods at home that meet the current recommended diet Yes   Patient follows low sugar/card diet Yes   Nutritional concerns/issues PT EATS PIZZA, [place](WEEKLY)     Activity Level   ADL's/Mobility Independent   How many feet can patient ambulate 300   Typical activity level INDEPENTANT   Barriers LANGUAGE   Actvity tolerance: NYHA Class 4     Urine   Difficulty urinating No   Changes in urine None     Time spent with patient   Time spent with patient  60 Minutes        Future Appointments Date  Time Provider Department Center  06/26/2016 2:30 PM MC-CV NL VASC 2 [facility]  07/04/2016 8:00 AM MC-CARDIAC PHASE II ORIENT [facility] None  07/08/2016 1:15 PM MC-PHASE2 MONITOR 24 [facility] None  07/10/2016 1:15 PM MC-PHASE2 MONITOR 24 [facility] None  07/12/2016 1:15 PM MC-PHASE2 MONITOR 24 [facility] None  07/15/2016 1:15 PM MC-PHASE2 MONITOR 24 [facility] None  07/17/2016 1:15 PM MC-PHASE2 MONITOR 24 [facility] None  07/19/2016 1:15 PM MC-PHASE2 MONITOR 24 [facility] None  07/22/2016 6:45 AM MC-PHASE2 MONITOR 24 [facility] None  07/22/2016 1:30 PM Enobong Amao, MD [facility] None  07/24/2016 1:15 PM MC-PHASE2 MONITOR 24 [facility] None  07/26/2016 1:15 PM MC-PHASE2 MONITOR 24 [facility] None  07/29/2016 1:15 PM MC-PHASE2 MONITOR 24 [facility] None  07/30/2016 2:00 PM MC-[facility] PA/NP [facility] None  07/31/2016 1:15 PM MC-PHASE2 MONITOR 24 [facility]RE[facility] None  08/02/2016 1:15 PM MC-PHASE2 MONITOR 24 MC-RE[facility] None  08/05/2016 1:15 PM MC-PHASE2 MONITOR 24 MC-RE[facility] None  08/07/2016 1:15 PM MC-PHASE2 MONITOR 24 MC-RE[facility] None  08/09/2016 1:15 PM MC-PHASE2 MONITOR 24 MC-RE[facility] None  08/12/2016 1:15 PM MC-PHASE2 MONITOR 24 MC-RE[facility] None  08/14/2016 1:15 PM MC-PHASE2 MONITOR 24 MC-RE[facility] None  08/16/2016 1:15 PM MC-PHASE2 MONITOR 24 MC-RE[facility] None  08/21/2016 1:15 PM MC-PHASE2 MONITOR 24 MC-RE[facility] None  08/23/2016 1:15 PM MC-PHASE2 MONITOR 24 MC-RE[facility] None  08/26/2016 1:15 PM MC-PHASE2 MONITOR 24 MC-RE[facility] None  08/28/2016 1:15 PM MC-PHASE2 MONITOR 24 MC-RE[facility] None  08/30/2016 1:15 PM MC-PHASE2 MONITOR 24 MC-RE[facility] None  09/02/2016 1:15 PM MC-PHASE2 MONITOR 24 MC-RE[facility] None  09/04/2016 1:15 PM MC-PHASE2 MONITOR 24 MC-RE[facility] None  09/06/2016 1:15 PM MC-PHASE2 MONITOR 24 MC-RE[facility] None  09/09/2016 1:15 PM MC-PHASE2 MONITOR 24 MC-RE[facility] None  09/11/2016 1:15 PM MC-PHASE2 MONITOR 24 MC-RE[facility] None  09/13/2016 1:15 PM MC-PHASE2 MONITOR 24 MC-RE[facility] None  09/16/2016 1:15 PM MC-PHASE2 MONITOR 24 MC-RE[facility] None  09/18/2016 1:15 PM MC-PHASE2 MONITOR 24 MC-RE[facility] None  09/20/2016  1:15 PM MC-PHASE2 MONITOR 24 MC-RE[facility] None  09/23/2016 1:15 PM MC-PHASE2 MONITOR 24 MC-RE[facility]SC None   ATF pt CAO x4 with no complaints today. pt stated that he was prescribed prednisone, during his last appointment with his primary physician. Pt refuses to take this medication because he is afraid of gaining more weight.  I advised pt to let his physician know the same.  Pt denies sob, dizziness, headache and chest pain.  Pt stated that his home aide changes daily and they are coming at different times of the day.  Pt's vitals noted. Pt has not taken his medications yet.  He has several empty cans of pepsi in the trash can and beside his bed.  I reminded pt of his drinking limitations and I advised him against drinking soda which are high in sugar. rx bottles verified and pill box filled.   cbg 353  Called in: (no missing pills) digitek xarelto  BP 122/78 (BP Location: Right Arm, Patient Position: Sitting, Cuff Size: Normal)   Pulse 88   Resp 16   Wt 224 lb (101.6 kg)   SpO2 98%   BMI 37.28 kg/m   Weight yesterday-226 Last visit weight-228    ACTION: Home visit completed Next visit planned for next wed

## 2016-06-14 NOTE — Telephone Encounter (Signed)
Call placed to the patient.  He said that he is " doing good' but noted that he is concerned about his help. He said that the agency is sending new aides every day. He also was concerned that some of the aides are too old help and he could use more hours.  Explained to him that the focus of the aides services is to provide personal care, not housekeeping.  He said that he understood.  He reported that the aides are from [facility]. This CM to call [facility] to inquire of the inconsistency of sending the same aides to assist.    Call placed to [facility] # [phone] and spoke to Ms Mason Jim. She stated that it is hard to keep the patient compliant with the aide schedule. She noted that sometimes he is not home for the visits. She said that next week, Manon Hilding, aide , is scheduled to see the patient all week. She noted that he has been approved for 8. 5 hrs/week and they too have re-enforced with him that the focus of the aide being in the home is to provide personal care. She said that the aide is not seeing the patient until the afternoon as per his request.   Attempted to contact the patient to discuss his current PCS status with [facility]. Call placed to # [phone] and a HIPAA compliant voicemail message was left requesting a call back to # [phone] or [phone]

## 2016-06-19 ENCOUNTER — Other Ambulatory Visit (HOSPITAL_COMMUNITY): Payer: Self-pay

## 2016-06-19 NOTE — Progress Notes (Signed)
Paramedicine Encounter    Patient ID: Jacob [name], male    DOB: [birth year], 61 y.o.   MRN: [id number]  Patient Care Team: [name], MD as PCP - General (Internal Medicine) Clarisa Schools, RN as Registered Nurse Corbin Ade, MD as Consulting Physician (Gastroenterology)  Patient Active Problem List   Diagnosis Date Noted  . Congestive heart disease (HCC) 01/01/2016  . Diabetic neuropathy (HCC) 11/28/2015  . Ascites 11/09/2015  . CHF (congestive heart failure) (HCC) 11/09/2015  . Insomnia 04/18/2015  . Claudication of right lower extremity (HCC) 04/07/2015  . Cardiomyopathy, ischemic 04/07/2015  . Chronic anticoagulation 03/30/2015  . Cardiorenal syndrome with renal failure   . SOB (shortness of breath)   . Morbid obesity due to excess calories (HCC)   . Helicobacter pylori ab+ 12/12/2014  . Calf pain   . Duodenal ulcer with hemorrhage   . Hemorrhagic shock   . Cardiogenic shock (HCC)   . Coronary artery disease involving native coronary artery of native heart without angina pectoris   . Atrial fibrillation with rapid ventricular response (HCC) 10/29/2014  . Chest pain   . Atherosclerosis of native arteries of extremity with intermittent claudication (HCC) 09/06/2014  . Atrial fibrillation with controlled ventricular response (HCC) 08/03/2014  . Bacteremia   . DM (diabetes mellitus), type 2 with peripheral vascular complications (HCC) 08/01/2014  . Anemia of chronic disease 08/01/2014  . PVD (peripheral vascular disease) (HCC) 10/29/2013  . Tobacco abuse 05/13/2013  . AF (atrial fibrillation) (HCC) 02/11/2013  . ICD - in place- BS May 2014 [place] 02/11/2013  . Microcytic anemia 01/11/2013  . 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 Noncompliance 12/01/2011  . Essential hypertension 10/30/2006    Current Outpatient Prescriptions:  .  amiodarone (PACERONE) 200 MG tablet, Take 1 tablet (200 mg total) by mouth daily., Disp: 30 tablet, Rfl: 3 .   atorvastatin (LIPITOR) 40 MG tablet, Take 1 tablet (40 mg total) by mouth daily at 6 PM., Disp: 30 tablet, Rfl: 1 .  Blood Glucose Monitoring Suppl (ACCU-CHEK AVIVA) device, Use as instructed1 times daily before meals, Disp: 1 each, Rfl: 0 .  digoxin (LANOXIN) 0.125 MG tablet, Take 0.5 tablets (0.0625 mg total) by mouth daily., Disp: 15 tablet, Rfl: 3 .  ferrous sulfate 325 (65 FE) MG tablet, TAKE 1 TABLET BY MOUTH 3 TIMES DAILY WITH MEALS., Disp: 90 tablet, Rfl: 2 .  gabapentin (NEURONTIN) 300 MG capsule, Take 1 capsule (300 mg total) by mouth 2 (two) times daily., Disp: 60 capsule, Rfl: 5 .  glipiZIDE (GLUCOTROL) 10 MG tablet, TAKE 1 TABLET BY MOUTH 2 TIMES DAILY BEFORE A MEAL., Disp: 60 tablet, Rfl: 2 .  glucose blood (ACCU-CHEK AVIVA) test strip, Use as instructed for 1 times daily testing of blood sugar, Disp: 100 each, Rfl: 12 .  hydrALAZINE (APRESOLINE) 25 MG tablet, Take 1 tablet (25 mg total) by mouth 3 (three) times daily., Disp: 90 tablet, Rfl: 3 .  Insulin Glargine (LANTUS) 100 UNIT/ML Solostar Pen, Inject 15 Units into the skin daily at 10 pm., Disp: 2 pen, Rfl: 5 .  Insulin Pen Needle 31G X 5 MM MISC, Use as directed for once daily insulin injection, Disp: 100 each, Rfl: 2 .  isosorbide mononitrate (IMDUR) 30 MG 24 hr tablet, Take 1 tablet (30 mg total) by mouth daily., Disp: 30 tablet, Rfl: 3 .  Lancet Devices (ACCU-CHEK SOFTCLIX) lancets, Use as instructed for once daily testing of blood sugar, Disp: 1  each, Rfl: 0 .  meclizine (ANTIVERT) 25 MG tablet, Take 25 mg by mouth 2 (two) times daily as needed for nausea., Disp: , Rfl:  .  rivaroxaban (XARELTO) 20 MG TABS tablet, Take 1 tablet (20 mg total) by mouth daily before supper., Disp: 30 tablet, Rfl: 3 .  spironolactone (ALDACTONE) 25 MG tablet, Take 25 mg by mouth daily., Disp: , Rfl:  .  torsemide (DEMADEX) 20 MG tablet, Take 4 tablets (80 mg total) by mouth 2 (two) times daily., Disp: 240 tablet, Rfl: 3 .  ACCU-CHEK SOFTCLIX  LANCETS lancets, Use for once daily testing of blood sugar, Disp: 100 each, Rfl: 12 .  acetaminophen-codeine (TYLENOL #3) 300-30 MG tablet, Take 1 tablet by mouth every 12 (twelve) hours as needed for moderate pain., Disp: 60 tablet, Rfl: 0 .  albuterol (PROVENTIL HFA;VENTOLIN HFA) 108 (90 Base) MCG/ACT inhaler, Inhale 2 puffs into the lungs every 6 (six) hours as needed for wheezing or shortness of breath., Disp: 1 Inhaler, Rfl: 2 .  albuterol (PROVENTIL) (2.5 MG/3ML) 0.083% nebulizer solution, Take 3 mLs (2.5 mg total) by nebulization every 6 (six) hours as needed for wheezing or shortness of breath., Disp: 150 mL, Rfl: 1 .  lactulose (CHRONULAC) 10 GM/15ML solution, Take 15 mLs (10 g total) by mouth daily as needed for mild constipation., Disp: 240 mL, Rfl: 0 .  predniSONE (DELTASONE) 20 MG tablet, Take 1 tablet (20 mg total) by mouth daily with breakfast. (Patient not taking: Reported on 06/12/2016), Disp: 5 tablet, Rfl: 0 Allergies  Allergen Reactions  . Tylenol [Acetaminophen] Itching  . Tape Itching    Paper tape please.     Social History   Social History  . Marital status: Divorced    Spouse name: N/A  . Number of children: 3  . Years of education: N/A   Occupational History  . disabled    Social History Main Topics  . Smoking status: Current Every Day Smoker    Packs/day: 0.50    Years: 40.00    Types: Cigarettes  . Smokeless tobacco: Never Used  . Alcohol use No  . Drug use: No  . Sexual activity: No   Other Topics Concern  . Not on file   Social History Narrative   Has an apartment with a roommate. He was living on the streets in January 17, 2013.  He reports that his father died in [place] in 17-Jan-2013.  He is divorced.  He is no longer estranged from his son, but still from his daughter who lives locally.  Neither of his parents, nor any siblings have any history of CAD.    Physical Exam  Eyes: Pupils are equal, round, and reactive to light.  Pulmonary/Chest: No  respiratory distress. He has no wheezes. He has no rales.  Abdominal: He exhibits no distension. There is no tenderness. There is no guarding.  Musculoskeletal: He exhibits no edema.  Skin: Skin is warm and dry. He is not diaphoretic.        SAFE - 05/07/16 1000      Situation   Admitting diagnosis CHF   Heart failure history Exisiting   Comorbidities Atrial fibillation;DM   Readmitted within 30 days No   Hospital admission within past 12 months Yes   number of hospital admissions 1   number of ED visits 1   Target Weight 225 lbs     Assessment   Lives alone Yes   Primary support person YES   Mode of transportation  personal car   Other services involved Other  HOME HEALTH AIDE([employer])   Home equipement Scale     Weight   Weighs self daily Yes   Scale provided Yes   Records on weight chart No     Resources   Has "Living better w/heart failure" book Yes   Has HF Zone tool Yes   Able to identify yellow zone signs/when to call MD Yes   Records zone daily No     Medications   Uses a pill box Yes   Who stocks the pill box [facility]   Pill box checked this visit Yes   Pill box refilled this visit Yes   Difficulty obtaining medications No   Mail order medications No   Missed one or more doses of medications per week Yes   How many missed doses this week 6     Nutrition   Patient receives meals on wheels No   Patient follows low sodium diet Yes   Has foods at home that meet the current recommended diet Yes   Patient follows low sugar/card diet Yes   Nutritional concerns/issues PT EATS PIZZA, [place](WEEKLY)     Activity Level   ADL's/Mobility Independent   How many feet can patient ambulate 300   Typical activity level INDEPENTANT   Barriers LANGUAGE   Actvity tolerance: NYHA Class 4     Urine   Difficulty urinating No   Changes in urine None     Time spent with patient   Time spent with patient  60 Minutes        Future Appointments Date  Time Provider Department Center  06/26/2016 2:30 PM [facility] VASC 2 [facility]  07/04/2016 8:00 AM [facility]ARDIAC PHASE II ORIENT [facility] None  07/05/2016 9:30 AM Helane Gunther, DPM [facility]  07/08/2016 1:15 PM MC-PHASE2 MONITOR 24 [facility] None  07/10/2016 1:15 PM MC-PHASE2 MONITOR 24 [facility] None  07/12/2016 1:15 PM MC-PHASE2 MONITOR 24 [facility] None  07/15/2016 1:15 PM MC-PHASE2 MONITOR 24 [facility] None  07/17/2016 1:15 PM MC-PHASE2 MONITOR 24 [facility] None  07/19/2016 1:15 PM MC-PHASE2 MONITOR 24 [facility] None  07/22/2016 6:45 AM MC-PHASE2 MONITOR 24 [facility] None  07/22/2016 1:30 PM Enobong Amao, MD [facility] None  07/24/2016 1:15 PM MC-PHASE2 MONITOR 24 [facility] None  07/26/2016 1:15 PM MC-PHASE2 MONITOR 24 [facility] None  07/29/2016 1:15 PM MC-PHASE2 MONITOR 24 [facility] None  07/30/2016 2:00 PM [facility] PA/NP [facility] None  07/31/2016 1:15 PM MC-PHASE2 MONITOR 24 [facility] None  08/02/2016 1:15 PM MC-PHASE2 MONITOR 24 [facility] None  08/05/2016 1:15 PM MC-PHASE2 MONITOR 24 [facility] None  08/07/2016 1:15 PM MC-PHASE2 MONITOR 24 [facility] None  08/09/2016 1:15 PM MC-PHASE2 MONITOR 24 [facility] None  08/12/2016 1:15 PM MC-PHASE2 MONITOR 24 [facility] None  08/14/2016 1:15 PM MC-PHASE2 MONITOR 24 [facility] None  08/16/2016 1:15 PM MC-PHASE2 MONITOR 24 [facility] None  08/21/2016 1:15 PM MC-PHASE2 MONITOR 24 [facility] None  08/23/2016 1:15 PM MC-PHASE2 MONITOR 24 [facility] None  08/26/2016 1:15 PM MC-PHASE2 MONITOR 24 [facility] None  08/28/2016 1:15 PM MC-PHASE2 MONITOR 24 [facility] None  08/30/2016 1:15 PM MC-PHASE2 MONITOR 24 [facility] None  09/02/2016 1:15 PM MC-PHASE2 MONITOR 24 [facility] None  09/04/2016 1:15 PM MC-PHASE2 MONITOR 24 [facility] None  09/06/2016 1:15 PM MC-PHASE2 MONITOR 24 [facility] None  09/09/2016 1:15 PM MC-PHASE2 MONITOR 24 [facility] None  09/11/2016 1:15 PM MC-PHASE2 MONITOR 24 [facility] None  09/13/2016 1:15 PM MC-PHASE2 MONITOR 24 [facility] None  09/16/2016 1:15 PM MC-PHASE2 MONITOR 24 [facility]  None   09/18/2016 1:15 PM MC-PHASE2 MONITOR 24 [facility] None  09/20/2016 1:15 PM MC-PHASE2 MONITOR 24 [facility] None  09/23/2016 1:15 PM MC-PHASE2 MONITOR 24 [facility] None   ATF pt CAO x4 sleeping in his room with no complaints.  Pt had missed several medication doses over the weekend, pt stated that it he left his medications in his car while he was in [place].  Pt denies dizziness, headaches, sob, and chest pain. Pt's home aide is still coming in the evening, he requested that she no longer cook for him because he eats too much. rx bottles and pill box verified.   **Called in: Dig, torsemide xarelto ( filled until thurs evening)  BP 130/86 (BP Location: Right Arm, Patient Position: Sitting, Cuff Size: Normal)   Pulse 92   Resp 16   Wt 232 lb (105.2 kg)   SpO2 96%   BMI 38.61 kg/m   Weight yesterday-228 Last visit weight-228    ACTION: Home visit completed

## 2016-06-24 MED FILL — TORSEMIDE 20 MG TABLET: 20 | 30 days supply | Qty: 240 | Fill #0

## 2016-06-26 ENCOUNTER — Other Ambulatory Visit: Payer: Self-pay | Admitting: Cardiovascular Disease

## 2016-06-26 ENCOUNTER — Ambulatory Visit (HOSPITAL_COMMUNITY)
Admission: RE | Admit: 2016-06-26 | Discharge: 2016-06-26 | Disposition: A | Discharge status: Home / Self Care | Payer: Medicare Other | Source: Ambulatory Visit | Attending: Cardiology | Admitting: Cardiology

## 2016-06-26 ENCOUNTER — Other Ambulatory Visit (HOSPITAL_COMMUNITY): Payer: Self-pay

## 2016-06-26 DIAGNOSIS — R0989 Other specified symptoms and signs involving the circulatory and respiratory systems: Secondary | ICD-10-CM | POA: Diagnosis not present

## 2016-06-26 DIAGNOSIS — I779 Disorder of arteries and arterioles, unspecified: Secondary | ICD-10-CM

## 2016-06-26 NOTE — Progress Notes (Signed)
Paramedicine Encounter    Patient ID: Jacob [name], male    DOB: Feb 10, 1956, 61 y.o.   MRN: [id number]  Patient Care Team: [name], MD as PCP - General (Internal Medicine) Clarisa Schools, RN as Registered Nurse Corbin Ade, MD as Consulting Physician (Gastroenterology)  Patient Active Problem List   Diagnosis Date Noted  . Congestive heart disease (HCC) 01/01/2016  . Diabetic neuropathy (HCC) 11/28/2015  . Ascites 11/09/2015  . CHF (congestive heart failure) (HCC) 11/09/2015  . Insomnia 04/18/2015  . Claudication of right lower extremity (HCC) 04/07/2015  . Cardiomyopathy, ischemic 04/07/2015  . Chronic anticoagulation 03/30/2015  . Cardiorenal syndrome with renal failure   . SOB (shortness of breath)   . Morbid obesity due to excess calories (HCC)   . Helicobacter pylori ab+ 12/12/2014  . Calf pain   . Duodenal ulcer with hemorrhage   . Hemorrhagic shock   . Cardiogenic shock (HCC)   . Coronary artery disease involving native coronary artery of native heart without angina pectoris   . Atrial fibrillation with rapid ventricular response (HCC) 10/29/2014  . Chest pain   . Atherosclerosis of native arteries of extremity with intermittent claudication (HCC) 09/06/2014  . Atrial fibrillation with controlled ventricular response (HCC) 08/03/2014  . Bacteremia   . DM (diabetes mellitus), type 2 with peripheral vascular complications (HCC) 08/01/2014  . Anemia of chronic disease 08/01/2014  . PVD (peripheral vascular disease) (HCC) 10/29/2013  . Tobacco abuse 05/13/2013  . AF (atrial fibrillation) (HCC) 02/11/2013  . ICD - in place- BS May 2014 [facility] 02/11/2013  . Microcytic anemia 01/11/2013  . 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 Noncompliance 12/01/2011  . Essential hypertension 10/30/2006    Current Outpatient Prescriptions:  .  ACCU-CHEK SOFTCLIX LANCETS lancets, Use for once daily testing of blood sugar, Disp: 100 each, Rfl: 12 .   acetaminophen-codeine (TYLENOL #3) 300-30 MG tablet, Take 1 tablet by mouth every 12 (twelve) hours as needed for moderate pain., Disp: 60 tablet, Rfl: 0 .  albuterol (PROVENTIL HFA;VENTOLIN HFA) 108 (90 Base) MCG/ACT inhaler, Inhale 2 puffs into the lungs every 6 (six) hours as needed for wheezing or shortness of breath., Disp: 1 Inhaler, Rfl: 2 .  albuterol (PROVENTIL) (2.5 MG/3ML) 0.083% nebulizer solution, Take 3 mLs (2.5 mg total) by nebulization every 6 (six) hours as needed for wheezing or shortness of breath., Disp: 150 mL, Rfl: 1 .  amiodarone (PACERONE) 200 MG tablet, Take 1 tablet (200 mg total) by mouth daily., Disp: 30 tablet, Rfl: 3 .  atorvastatin (LIPITOR) 40 MG tablet, Take 1 tablet (40 mg total) by mouth daily at 6 PM., Disp: 30 tablet, Rfl: 1 .  Blood Glucose Monitoring Suppl (ACCU-CHEK AVIVA) device, Use as instructed1 times daily before meals, Disp: 1 each, Rfl: 0 .  digoxin (LANOXIN) 0.125 MG tablet, Take 0.5 tablets (0.0625 mg total) by mouth daily., Disp: 15 tablet, Rfl: 3 .  ferrous sulfate 325 (65 FE) MG tablet, TAKE 1 TABLET BY MOUTH 3 TIMES DAILY WITH MEALS., Disp: 90 tablet, Rfl: 2 .  gabapentin (NEURONTIN) 300 MG capsule, Take 1 capsule (300 mg total) by mouth 2 (two) times daily., Disp: 60 capsule, Rfl: 5 .  glipiZIDE (GLUCOTROL) 10 MG tablet, TAKE 1 TABLET BY MOUTH 2 TIMES DAILY BEFORE A MEAL., Disp: 60 tablet, Rfl: 2 .  glucose blood (ACCU-CHEK AVIVA) test strip, Use as instructed for 1 times daily testing of blood sugar, Disp: 100 each, Rfl: 12 .  hydrALAZINE (APRESOLINE) 25 MG tablet, Take 1 tablet (25 mg total) by mouth 3 (three) times daily., Disp: 90 tablet, Rfl: 3 .  Insulin Glargine (LANTUS) 100 UNIT/ML Solostar Pen, Inject 15 Units into the skin daily at 10 pm., Disp: 2 pen, Rfl: 5 .  Insulin Pen Needle 31G X 5 MM MISC, Use as directed for once daily insulin injection, Disp: 100 each, Rfl: 2 .  isosorbide mononitrate (IMDUR) 30 MG 24 hr tablet, Take 1 tablet (30  mg total) by mouth daily., Disp: 30 tablet, Rfl: 3 .  lactulose (CHRONULAC) 10 GM/15ML solution, Take 15 mLs (10 g total) by mouth daily as needed for mild constipation., Disp: 240 mL, Rfl: 0 .  Lancet Devices (ACCU-CHEK SOFTCLIX) lancets, Use as instructed for once daily testing of blood sugar, Disp: 1 each, Rfl: 0 .  meclizine (ANTIVERT) 25 MG tablet, Take 25 mg by mouth 2 (two) times daily as needed for nausea., Disp: , Rfl:  .  predniSONE (DELTASONE) 20 MG tablet, Take 1 tablet (20 mg total) by mouth daily with breakfast. (Patient not taking: Reported on 06/12/2016), Disp: 5 tablet, Rfl: 0 .  rivaroxaban (XARELTO) 20 MG TABS tablet, Take 1 tablet (20 mg total) by mouth daily before supper., Disp: 30 tablet, Rfl: 3 .  spironolactone (ALDACTONE) 25 MG tablet, Take 25 mg by mouth daily., Disp: , Rfl:  .  torsemide (DEMADEX) 20 MG tablet, Take 4 tablets (80 mg total) by mouth 2 (two) times daily., Disp: 240 tablet, Rfl: 3 Allergies  Allergen Reactions  . Tylenol [Acetaminophen] Itching  . Tape Itching    Paper tape please.     Social History   Social History  . Marital status: Divorced    Spouse name: N/A  . Number of children: 3  . Years of education: N/A   Occupational History  . disabled    Social History Main Topics  . Smoking status: Current Every Day Smoker    Packs/day: 0.50    Years: 40.00    Types: Cigarettes  . Smokeless tobacco: Never Used  . Alcohol use No  . Drug use: No  . Sexual activity: No   Other Topics Concern  . Not on file   Social History Narrative   Has an apartment with a roommate. He was living on the streets in 2013-01-16.  He reports that his father died in [place] in January 16, 2013.  He is divorced.  He is no longer estranged from his son, but still from his daughter who lives locally.  Neither of his parents, nor any siblings have any history of CAD.    Physical Exam  Eyes: Pupils are equal, round, and reactive to light.  Pulmonary/Chest: No  respiratory distress. He has no wheezes. He has no rales.  Abdominal: He exhibits no distension. There is no tenderness.  Musculoskeletal: He exhibits no edema.  Skin: Skin is warm and dry. He is not diaphoretic.        SAFE - 05/07/16 1000      Situation   Admitting diagnosis CHF   Heart failure history Exisiting   Comorbidities Atrial fibillation;DM   Readmitted within 30 days No   Hospital admission within past 12 months Yes   number of hospital admissions 1   number of ED visits 1   Target Weight 225 lbs     Assessment   Lives alone Yes   Primary support person YES   Mode of transportation personal car   Other services  involved Other  HOME HEALTH AIDE([employer])   Home equipement Scale     Weight   Weighs self daily Yes   Scale provided Yes   Records on weight chart No     Resources   Has "Living better w/heart failure" book Yes   Has HF Zone tool Yes   Able to identify yellow zone signs/when to call MD Yes   Records zone daily No     Medications   Uses a pill box Yes   Who stocks the pill box [facility]   Pill box checked this visit Yes   Pill box refilled this visit Yes   Difficulty obtaining medications No   Mail order medications No   Missed one or more doses of medications per week Yes   How many missed doses this week 6     Nutrition   Patient receives meals on wheels No   Patient follows low sodium diet Yes   Has foods at home that meet the current recommended diet Yes   Patient follows low sugar/card diet Yes   Nutritional concerns/issues PT EATS PIZZA, [place](WEEKLY)     Activity Level   ADL's/Mobility Independent   How many feet can patient ambulate 300   Typical activity level INDEPENTANT   Barriers LANGUAGE   Actvity tolerance: NYHA Class 4     Urine   Difficulty urinating No   Changes in urine None     Time spent with patient   Time spent with patient  60 Minutes        Future Appointments Date Time Provider Department  Center  06/26/2016 2:30 PM [facility] VASC 2 [facility]  07/04/2016 8:00 AM [facility]-CARDIAC PHASE II ORIENT [facility] None  07/05/2016 9:30 AM Helane Gunther, DPM [facility]  07/08/2016 1:15 PM MC-PHASE2 MONITOR 24 [facility] None  07/10/2016 1:15 PM MC-PHASE2 MONITOR 24 [facility] None  07/12/2016 1:15 PM MC-PHASE2 MONITOR 24 [facility] None  07/15/2016 1:15 PM MC-PHASE2 MONITOR 24 [facility] None  07/17/2016 1:15 PM MC-PHASE2 MONITOR 24 [facility] None  07/19/2016 1:15 PM MC-PHASE2 MONITOR 24 [facility] None  07/22/2016 1:30 PM Enobong Amao, MD [facility] None  07/24/2016 1:15 PM MC-PHASE2 MONITOR 24 [facility] None  07/26/2016 1:15 PM MC-PHASE2 MONITOR 24 [facility] None  07/29/2016 1:15 PM MC-PHASE2 MONITOR 24 [facility] None  07/30/2016 2:00 PM [facility] PA/NP [facility] None  07/31/2016 1:15 PM MC-PHASE2 MONITOR 24 [facility] None  08/02/2016 1:15 PM MC-PHASE2 MONITOR 24 [facility] None  08/05/2016 1:15 PM MC-PHASE2 MONITOR 24 [facility] None  08/07/2016 1:15 PM MC-PHASE2 MONITOR 24 [facility] None  08/09/2016 1:15 PM MC-PHASE2 MONITOR 24 [facility] None  08/12/2016 1:15 PM MC-PHASE2 MONITOR 24 [facility] None  08/14/2016 1:15 PM MC-PHASE2 MONITOR 24 [facility] None  08/16/2016 1:15 PM MC-PHASE2 MONITOR 24 [facility] None  08/21/2016 1:15 PM MC-PHASE2 MONITOR 24 [facility] None  08/23/2016 1:15 PM MC-PHASE2 MONITOR 24 [facility] None  08/26/2016 1:15 PM MC-PHASE2 MONITOR 24 [facility] None  08/28/2016 1:15 PM MC-PHASE2 MONITOR 24 [facility] None  08/30/2016 1:15 PM MC-PHASE2 MONITOR 24 [facility] None  09/02/2016 1:15 PM MC-PHASE2 MONITOR 24 [facility] None  09/04/2016 1:15 PM MC-PHASE2 MONITOR 24 [facility] None  09/06/2016 1:15 PM MC-PHASE2 MONITOR 24 [facility] None  09/09/2016 1:15 PM MC-PHASE2 MONITOR 24 [facility] None  09/11/2016 1:15 PM MC-PHASE2 MONITOR 24 [facility] None  09/13/2016 1:15 PM MC-PHASE2 MONITOR 24 [facility] None  09/16/2016 1:15 PM MC-PHASE2 MONITOR 24 [facility] None  09/18/2016 1:15 PM MC-PHASE2 MONITOR 24 [facility] None  09/20/2016 1:15 PM MC-PHASE2  MONITOR 24 [facility] None  09/23/2016 1:15 PM MC-PHASE2 MONITOR 24 [facility] None   ATF pt CAO x4 with no complaints today.  Pt denies sob, headaches, dizziness, and chest pain.  Pt missed two medication doses within this past week.  Pt did not pick up his medications for pill box refill.  Pt stated that he will pick up his medications today after his doctors appointment.  Pt's pill box was not filled due to pt not having all of his medications.  I advised pt that I would need to come back tomorrow for pill box.    vest fitting on pt was 40% (pts BMI may been altered due to pt's height; the vest fitting was tight even after the shift).  cbg 214 BP 122/66 (BP Location: Right Arm, Patient Position: Sitting, Cuff Size: Normal)   Pulse 89   Resp 16   Wt 228 lb (103.4 kg)   SpO2 98%   BMI 37.94 kg/m   Weight yesterday-228 Last visit weight-232    ACTION: Home visit completed

## 2016-07-02 ENCOUNTER — Other Ambulatory Visit: Payer: Self-pay

## 2016-07-02 DIAGNOSIS — I779 Disorder of arteries and arterioles, unspecified: Secondary | ICD-10-CM

## 2016-07-03 ENCOUNTER — Other Ambulatory Visit (HOSPITAL_COMMUNITY): Payer: Self-pay

## 2016-07-03 MED FILL — XARELTO 20 MG TABLET: 20 | 30 days supply | Qty: 30 | Fill #0

## 2016-07-03 MED FILL — DIGITEK 125 MCG TABLET: 125 | 30 days supply | Qty: 15 | Fill #4

## 2016-07-03 MED FILL — SPIRONOLACTONE 25 MG TABLET: 25 | 30 days supply | Qty: 30 | Fill #4

## 2016-07-03 NOTE — Progress Notes (Signed)
Paramedicine Encounter    Patient ID: Jacob [name], male    DOB: [birth year], 61 y.o.   MRN: [id number]  Patient Care Team: [name], MD as PCP - General (Internal Medicine) Clarisa Schools, RN as Registered Nurse Corbin Ade, MD as Consulting Physician (Gastroenterology)  Patient Active Problem List   Diagnosis Date Noted  . Congestive heart disease (HCC) 01/01/2016  . Diabetic neuropathy (HCC) 11/28/2015  . Ascites 11/09/2015  . CHF (congestive heart failure) (HCC) 11/09/2015  . Insomnia 04/18/2015  . Claudication of right lower extremity (HCC) 04/07/2015  . Cardiomyopathy, ischemic 04/07/2015  . Chronic anticoagulation 03/30/2015  . Cardiorenal syndrome with renal failure   . SOB (shortness of breath)   . Morbid obesity due to excess calories (HCC)   . Helicobacter pylori ab+ 12/12/2014  . Calf pain   . Duodenal ulcer with hemorrhage   . Hemorrhagic shock   . Cardiogenic shock (HCC)   . Coronary artery disease involving native coronary artery of native heart without angina pectoris   . Atrial fibrillation with rapid ventricular response (HCC) 10/29/2014  . Chest pain   . Atherosclerosis of native arteries of extremity with intermittent claudication (HCC) 09/06/2014  . Atrial fibrillation with controlled ventricular response (HCC) 08/03/2014  . Bacteremia   . DM (diabetes mellitus), type 2 with peripheral vascular complications (HCC) 08/01/2014  . Anemia of chronic disease 08/01/2014  . PVD (peripheral vascular disease) (HCC) 10/29/2013  . Tobacco abuse 05/13/2013  . AF (atrial fibrillation) (HCC) 02/11/2013  . ICD - in place- BS May 2014 [facility] 02/11/2013  . Microcytic anemia 01/11/2013  . 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 Noncompliance 12/01/2011  . Essential hypertension 10/30/2006    Current Outpatient Prescriptions:  .  ACCU-CHEK SOFTCLIX LANCETS lancets, Use for once daily testing of blood sugar, Disp: 100 each, Rfl: 12 .   acetaminophen-codeine (TYLENOL #3) 300-30 MG tablet, Take 1 tablet by mouth every 12 (twelve) hours as needed for moderate pain., Disp: 60 tablet, Rfl: 0 .  albuterol (PROVENTIL HFA;VENTOLIN HFA) 108 (90 Base) MCG/ACT inhaler, Inhale 2 puffs into the lungs every 6 (six) hours as needed for wheezing or shortness of breath., Disp: 1 Inhaler, Rfl: 2 .  albuterol (PROVENTIL) (2.5 MG/3ML) 0.083% nebulizer solution, Take 3 mLs (2.5 mg total) by nebulization every 6 (six) hours as needed for wheezing or shortness of breath., Disp: 150 mL, Rfl: 1 .  amiodarone (PACERONE) 200 MG tablet, Take 1 tablet (200 mg total) by mouth daily., Disp: 30 tablet, Rfl: 3 .  atorvastatin (LIPITOR) 40 MG tablet, Take 1 tablet (40 mg total) by mouth daily at 6 PM., Disp: 30 tablet, Rfl: 1 .  Blood Glucose Monitoring Suppl (ACCU-CHEK AVIVA) device, Use as instructed1 times daily before meals, Disp: 1 each, Rfl: 0 .  digoxin (LANOXIN) 0.125 MG tablet, Take 0.5 tablets (0.0625 mg total) by mouth daily., Disp: 15 tablet, Rfl: 3 .  ferrous sulfate 325 (65 FE) MG tablet, TAKE 1 TABLET BY MOUTH 3 TIMES DAILY WITH MEALS., Disp: 90 tablet, Rfl: 2 .  hydrALAZINE (APRESOLINE) 25 MG tablet, Take 1 tablet (25 mg total) by mouth 3 (three) times daily., Disp: 90 tablet, Rfl: 3 .  Insulin Glargine (LANTUS) 100 UNIT/ML Solostar Pen, Inject 15 Units into the skin daily at 10 pm., Disp: 2 pen, Rfl: 5 .  Insulin Pen Needle 31G X 5 MM MISC, Use as directed for once daily insulin injection, Disp: 100 each, Rfl: 2 .  isosorbide mononitrate (IMDUR) 30 MG 24 hr tablet, Take 1 tablet (30 mg total) by mouth daily., Disp: 30 tablet, Rfl: 3 .  lactulose (CHRONULAC) 10 GM/15ML solution, Take 15 mLs (10 g total) by mouth daily as needed for mild constipation., Disp: 240 mL, Rfl: 0 .  Lancet Devices (ACCU-CHEK SOFTCLIX) lancets, Use as instructed for once daily testing of blood sugar, Disp: 1 each, Rfl: 0 .  meclizine (ANTIVERT) 25 MG tablet, Take 25 mg by  mouth 2 (two) times daily as needed for nausea., Disp: , Rfl:  .  predniSONE (DELTASONE) 20 MG tablet, Take 1 tablet (20 mg total) by mouth daily with breakfast., Disp: 5 tablet, Rfl: 0 .  rivaroxaban (XARELTO) 20 MG TABS tablet, Take 1 tablet (20 mg total) by mouth daily before supper., Disp: 30 tablet, Rfl: 3 .  spironolactone (ALDACTONE) 25 MG tablet, Take 25 mg by mouth daily., Disp: , Rfl:  .  torsemide (DEMADEX) 20 MG tablet, Take 4 tablets (80 mg total) by mouth 2 (two) times daily., Disp: 240 tablet, Rfl: 3 .  gabapentin (NEURONTIN) 300 MG capsule, Take 1 capsule (300 mg total) by mouth 2 (two) times daily., Disp: 60 capsule, Rfl: 5 .  glipiZIDE (GLUCOTROL) 10 MG tablet, TAKE 1 TABLET BY MOUTH 2 TIMES DAILY BEFORE A MEAL., Disp: 60 tablet, Rfl: 2 .  glucose blood (ACCU-CHEK AVIVA) test strip, Use as instructed for 1 times daily testing of blood sugar, Disp: 100 each, Rfl: 12 Allergies  Allergen Reactions  . Tylenol [Acetaminophen] Itching  . Tape Itching    Paper tape please.     Social History   Social History  . Marital status: Divorced    Spouse name: N/A  . Number of children: 3  . Years of education: N/A   Occupational History  . disabled    Social History Main Topics  . Smoking status: Current Every Day Smoker    Packs/day: 0.50    Years: 40.00    Types: Cigarettes  . Smokeless tobacco: Never Used  . Alcohol use No  . Drug use: No  . Sexual activity: No   Other Topics Concern  . Not on file   Social History Narrative   Has an apartment with a roommate. He was living on the streets in 16-Jan-2013.  He reports that his father died in [place] in 2013-01-16.  He is divorced.  He is no longer estranged from his son, but still from his daughter who lives locally.  Neither of his parents, nor any siblings have any history of CAD.    Physical Exam  Eyes: Pupils are equal, round, and reactive to light.  Pulmonary/Chest: No respiratory distress. He has no wheezes.  He has no rales.  Abdominal: He exhibits no distension. There is no tenderness. There is no rebound.  Musculoskeletal: He exhibits no edema.  Skin: Skin is warm and dry. He is not diaphoretic.        SAFE - 05/07/16 1000      Situation   Admitting diagnosis CHF   Heart failure history Exisiting   Comorbidities Atrial fibillation;DM   Readmitted within 30 days No   Hospital admission within past 12 months Yes   number of hospital admissions 1   number of ED visits 1   Target Weight 225 lbs     Assessment   Lives alone Yes   Primary support person YES   Mode of transportation personal car   Other services involved Other  HOME HEALTH AIDE([employer])   Home equipement Scale     Weight   Weighs self daily Yes   Scale provided Yes   Records on weight chart No     Resources   Has "Living better w/heart failure" book Yes   Has HF Zone tool Yes   Able to identify yellow zone signs/when to call MD Yes   Records zone daily No     Medications   Uses a pill box Yes   Who stocks the pill box [facility]   Pill box checked this visit Yes   Pill box refilled this visit Yes   Difficulty obtaining medications No   Mail order medications No   Missed one or more doses of medications per week Yes   How many missed doses this week 6     Nutrition   Patient receives meals on wheels No   Patient follows low sodium diet Yes   Has foods at home that meet the current recommended diet Yes   Patient follows low sugar/card diet Yes   Nutritional concerns/issues PT EATS PIZZA, [place](WEEKLY)     Activity Level   ADL's/Mobility Independent   How many feet can patient ambulate 300   Typical activity level INDEPENTANT   Barriers LANGUAGE   Actvity tolerance: NYHA Class 4     Urine   Difficulty urinating No   Changes in urine None     Time spent with patient   Time spent with patient  60 Minutes        Future Appointments Date Time Provider Department Center  07/04/2016  8:00 AM [facility] PHASE II ORIENT [facility] None  07/05/2016 9:30 AM Helane Gunther, DPM [facility]  07/08/2016 1:15 PM MC-PHASE2 MONITOR 24 [facility] None  07/10/2016 1:15 PM MC-PHASE2 MONITOR 24 [facility] None  07/12/2016 1:15 PM MC-PHASE2 MONITOR 24 [facility] None  07/15/2016 1:15 PM MC-PHASE2 MONITOR 24 [facility] None  07/17/2016 1:15 PM MC-PHASE2 MONITOR 24 [facility] None  07/19/2016 1:15 PM MC-PHASE2 MONITOR 24 [facility] None  07/22/2016 1:30 PM Enobong Amao, MD [facility] None  07/24/2016 1:15 PM MC-PHASE2 MONITOR 24 [facility] None  07/26/2016 1:15 PM MC-PHASE2 MONITOR 24 [facility] None  07/29/2016 1:15 PM MC-PHASE2 MONITOR 24 [facility] None  07/30/2016 2:00 PM [facility] PA/NP [facility] None  07/31/2016 1:15 PM MC-PHASE2 MONITOR 24 [facility] None  08/02/2016 1:15 PM MC-PHASE2 MONITOR 24 [facility] None  08/05/2016 1:15 PM MC-PHASE2 MONITOR 24 [facility] None  08/07/2016 1:15 PM MC-PHASE2 MONITOR 24 [facility] None  08/09/2016 1:15 PM MC-PHASE2 MONITOR 24 [facility] None  08/12/2016 1:15 PM MC-PHASE2 MONITOR 24 [facility] None  08/14/2016 1:15 PM MC-PHASE2 MONITOR 24 [facility] None  08/16/2016 1:15 PM MC-PHASE2 MONITOR 24 [facility] None  08/21/2016 1:15 PM MC-PHASE2 MONITOR 24 [facility] None  08/23/2016 1:15 PM MC-PHASE2 MONITOR 24 [facility] None  08/26/2016 1:15 PM MC-PHASE2 MONITOR 24 [facility] None  08/28/2016 1:15 PM MC-PHASE2 MONITOR 24 [facility] None  08/30/2016 1:15 PM MC-PHASE2 MONITOR 24 [facility] None  09/02/2016 1:15 PM MC-PHASE2 MONITOR 24 [facility] None  09/04/2016 1:15 PM MC-PHASE2 MONITOR 24 [facility] None  09/06/2016 1:15 PM MC-PHASE2 MONITOR 24 [facility] None  09/09/2016 1:15 PM MC-PHASE2 MONITOR 24 [facility] None  09/11/2016 1:15 PM MC-PHASE2 MONITOR 24 [facility] None  09/13/2016 1:15 PM MC-PHASE2 MONITOR 24 [facility] None  09/16/2016 1:15 PM MC-PHASE2 MONITOR 24 [facility] None  09/18/2016 1:15 PM MC-PHASE2 MONITOR 24 [facility] None  09/20/2016 1:15 PM MC-PHASE2 MONITOR 24 [facility] None  09/23/2016 1:15 PM MC-PHASE2 MONITOR 24  [facility]  None   ATF pt CAO x4 with complaints of unable to sleep during the night "I might have fluid on me".  Pt has missed several medications doses throughout the week.  Pt has no edema noted and no sob, no dizziness, and no chest pain.  Pts home aide is over this am.  rx bottles and pill box verified.    Called in xarelto, dig again (pharamcy had put the meds back) Cleda Daub was also called in today  Yesterday weight- pt did not weigh Last visit weight-228 (06/26/16)   BP 120/62 (BP Location: Right Arm, Patient Position: Sitting, Cuff Size: Normal)   Pulse 92   Resp 16   Wt 229 lb 6.4 oz (104.1 kg)   SpO2 99%   BMI 38.17 kg/m     ACTION: Home visit completed

## 2016-07-03 NOTE — Progress Notes (Signed)
Paramedicine Encounter    Patient ID: Jacob [name], male    DOB: 29-Mar-1955, 61 y.o.   MRN: [id number]   Patient Care Team: [name], MD as PCP - General (Internal Medicine) Clarisa Schools, RN as Registered Nurse Corbin Ade, MD as Consulting Physician (Gastroenterology)  Patient Active Problem List   Diagnosis Date Noted  . Congestive heart disease (HCC) 01/01/2016  . Diabetic neuropathy (HCC) 11/28/2015  . Ascites 11/09/2015  . CHF (congestive heart failure) (HCC) 11/09/2015  . Insomnia 04/18/2015  . Claudication of right lower extremity (HCC) 04/07/2015  . Cardiomyopathy, ischemic 04/07/2015  . Chronic anticoagulation 03/30/2015  . Cardiorenal syndrome with renal failure   . SOB (shortness of breath)   . Morbid obesity due to excess calories (HCC)   . Helicobacter pylori ab+ 12/12/2014  . Calf pain   . Duodenal ulcer with hemorrhage   . Hemorrhagic shock   . Cardiogenic shock (HCC)   . Coronary artery disease involving native coronary artery of native heart without angina pectoris   . Atrial fibrillation with rapid ventricular response (HCC) 10/29/2014  . Chest pain   . Atherosclerosis of native arteries of extremity with intermittent claudication (HCC) 09/06/2014  . Atrial fibrillation with controlled ventricular response (HCC) 08/03/2014  . Bacteremia   . DM (diabetes mellitus), type 2 with peripheral vascular complications (HCC) 08/01/2014  . Anemia of chronic disease 08/01/2014  . PVD (peripheral vascular disease) (HCC) 10/29/2013  . Tobacco abuse 05/13/2013  . AF (atrial fibrillation) (HCC) 02/11/2013  . ICD - in place- BS May 2014 [facility] 02/11/2013  . Microcytic anemia 01/11/2013  . 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 Noncompliance 12/01/2011  . Essential hypertension 10/30/2006    Current Outpatient Prescriptions:  .  ACCU-CHEK SOFTCLIX LANCETS lancets, Use for once daily testing of blood sugar, Disp: 100 each, Rfl: 12 .   acetaminophen-codeine (TYLENOL #3) 300-30 MG tablet, Take 1 tablet by mouth every 12 (twelve) hours as needed for moderate pain., Disp: 60 tablet, Rfl: 0 .  albuterol (PROVENTIL HFA;VENTOLIN HFA) 108 (90 Base) MCG/ACT inhaler, Inhale 2 puffs into the lungs every 6 (six) hours as needed for wheezing or shortness of breath., Disp: 1 Inhaler, Rfl: 2 .  albuterol (PROVENTIL) (2.5 MG/3ML) 0.083% nebulizer solution, Take 3 mLs (2.5 mg total) by nebulization every 6 (six) hours as needed for wheezing or shortness of breath., Disp: 150 mL, Rfl: 1 .  amiodarone (PACERONE) 200 MG tablet, Take 1 tablet (200 mg total) by mouth daily., Disp: 30 tablet, Rfl: 3 .  atorvastatin (LIPITOR) 40 MG tablet, Take 1 tablet (40 mg total) by mouth daily at 6 PM., Disp: 30 tablet, Rfl: 1 .  Blood Glucose Monitoring Suppl (ACCU-CHEK AVIVA) device, Use as instructed1 times daily before meals, Disp: 1 each, Rfl: 0 .  digoxin (LANOXIN) 0.125 MG tablet, Take 0.5 tablets (0.0625 mg total) by mouth daily., Disp: 15 tablet, Rfl: 3 .  ferrous sulfate 325 (65 FE) MG tablet, TAKE 1 TABLET BY MOUTH 3 TIMES DAILY WITH MEALS., Disp: 90 tablet, Rfl: 2 .  hydrALAZINE (APRESOLINE) 25 MG tablet, Take 1 tablet (25 mg total) by mouth 3 (three) times daily., Disp: 90 tablet, Rfl: 3 .  Insulin Glargine (LANTUS) 100 UNIT/ML Solostar Pen, Inject 15 Units into the skin daily at 10 pm., Disp: 2 pen, Rfl: 5 .  Insulin Pen Needle 31G X 5 MM MISC, Use as directed for once daily insulin injection, Disp: 100 each, Rfl:  2 .  isosorbide mononitrate (IMDUR) 30 MG 24 hr tablet, Take 1 tablet (30 mg total) by mouth daily., Disp: 30 tablet, Rfl: 3 .  lactulose (CHRONULAC) 10 GM/15ML solution, Take 15 mLs (10 g total) by mouth daily as needed for mild constipation., Disp: 240 mL, Rfl: 0 .  Lancet Devices (ACCU-CHEK SOFTCLIX) lancets, Use as instructed for once daily testing of blood sugar, Disp: 1 each, Rfl: 0 .  meclizine (ANTIVERT) 25 MG tablet, Take 25 mg by  mouth 2 (two) times daily as needed for nausea., Disp: , Rfl:  .  predniSONE (DELTASONE) 20 MG tablet, Take 1 tablet (20 mg total) by mouth daily with breakfast., Disp: 5 tablet, Rfl: 0 .  rivaroxaban (XARELTO) 20 MG TABS tablet, Take 1 tablet (20 mg total) by mouth daily before supper., Disp: 30 tablet, Rfl: 3 .  spironolactone (ALDACTONE) 25 MG tablet, Take 25 mg by mouth daily., Disp: , Rfl:  .  torsemide (DEMADEX) 20 MG tablet, Take 4 tablets (80 mg total) by mouth 2 (two) times daily., Disp: 240 tablet, Rfl: 3 .  gabapentin (NEURONTIN) 300 MG capsule, Take 1 capsule (300 mg total) by mouth 2 (two) times daily., Disp: 60 capsule, Rfl: 5 .  glipiZIDE (GLUCOTROL) 10 MG tablet, TAKE 1 TABLET BY MOUTH 2 TIMES DAILY BEFORE A MEAL., Disp: 60 tablet, Rfl: 2 .  glucose blood (ACCU-CHEK AVIVA) test strip, Use as instructed for 1 times daily testing of blood sugar, Disp: 100 each, Rfl: 12 Allergies  Allergen Reactions  . Tylenol [Acetaminophen] Itching  . Tape Itching    Paper tape please.     Social History   Social History  . Marital status: Divorced    Spouse name: N/A  . Number of children: 3  . Years of education: N/A   Occupational History  . disabled    Social History Main Topics  . Smoking status: Current Every Day Smoker    Packs/day: 0.50    Years: 40.00    Types: Cigarettes  . Smokeless tobacco: Never Used  . Alcohol use No  . Drug use: No  . Sexual activity: No   Other Topics Concern  . Not on file   Social History Narrative   Has an apartment with a roommate. He was living on the streets in 01/17/2013.  He reports that his father died in [place] in 01/17/13.  He is divorced.  He is no longer estranged from his son, but still from his daughter who lives locally.  Neither of his parents, nor any siblings have any history of CAD.    Physical Exam      SAFE - 05/07/16 1000      Situation   Admitting diagnosis CHF   Heart failure history Exisiting    Comorbidities Atrial fibillation;DM   Readmitted within 30 days No   Hospital admission within past 12 months Yes   number of hospital admissions 1   number of ED visits 1   Target Weight 225 lbs     Assessment   Lives alone Yes   Primary support person YES   Mode of transportation personal car   Other services involved Other  HOME HEALTH AIDE([employer])   Home equipement Scale     Weight   Weighs self daily Yes   Scale provided Yes   Records on weight chart No     Resources   Has "Living better w/heart failure" book Yes   Has HF Zone  tool Yes   Able to identify yellow zone signs/when to call MD Yes   Records zone daily No     Medications   Uses a pill box Yes   Who stocks the pill box [facility]   Pill box checked this visit Yes   Pill box refilled this visit Yes   Difficulty obtaining medications No   Mail order medications No   Missed one or more doses of medications per week Yes   How many missed doses this week 6     Nutrition   Patient receives meals on wheels No   Patient follows low sodium diet Yes   Has foods at home that meet the current recommended diet Yes   Patient follows low sugar/card diet Yes   Nutritional concerns/issues PT EATS PIZZA, [place](WEEKLY)     Activity Level   ADL's/Mobility Independent   How many feet can patient ambulate 300   Typical activity level INDEPENTANT   Barriers LANGUAGE   Actvity tolerance: NYHA Class 4     Urine   Difficulty urinating No   Changes in urine None     Time spent with patient   Time spent with patient  60 Minutes        Future Appointments Date Time Provider Department Center  07/04/2016 8:00 AM [facility] PHASE II ORIENT [facility] None  07/05/2016 9:30 AM Helane Gunther, DPM [facility]  07/08/2016 1:15 PM MC-PHASE2 MONITOR 24 [facility] None  07/10/2016 1:15 PM MC-PHASE2 MONITOR 24 [facility] None  07/12/2016 1:15 PM MC-PHASE2 MONITOR 24 [facility] None  07/15/2016 1:15 PM MC-PHASE2 MONITOR  24 [facility] None  07/17/2016 1:15 PM MC-PHASE2 MONITOR 24 [facility] None  07/19/2016 1:15 PM MC-PHASE2 MONITOR 24 [facility] None  07/22/2016 1:30 PM Enobong Amao, MD [facility] None  07/24/2016 1:15 PM MC-PHASE2 MONITOR 24 [facility] None  07/26/2016 1:15 PM MC-PHASE2 MONITOR 24 [facility] None  07/29/2016 1:15 PM MC-PHASE2 MONITOR 24 [facility] None  07/30/2016 2:00 PM [facility] PA/NP [facility] None  07/31/2016 1:15 PM MC-PHASE2 MONITOR 24 [facility] None  08/02/2016 1:15 PM MC-PHASE2 MONITOR 24 [facility] None  08/05/2016 1:15 PM MC-PHASE2 MONITOR 24 [facility] None  08/07/2016 1:15 PM MC-PHASE2 MONITOR 24 [facility] None  08/09/2016 1:15 PM MC-PHASE2 MONITOR 24 [facility] None  08/12/2016 1:15 PM MC-PHASE2 MONITOR 24 [facility] None  08/14/2016 1:15 PM MC-PHASE2 MONITOR 24 [facility] None  08/16/2016 1:15 PM MC-PHASE2 MONITOR 24 [facility] None  08/21/2016 1:15 PM MC-PHASE2 MONITOR 24 [facility] None  08/23/2016 1:15 PM MC-PHASE2 MONITOR 24 [facility] None  08/26/2016 1:15 PM MC-PHASE2 MONITOR 24 [facility] None  08/28/2016 1:15 PM MC-PHASE2 MONITOR 24 [facility] None  08/30/2016 1:15 PM MC-PHASE2 MONITOR 24 [facility] None  09/02/2016 1:15 PM MC-PHASE2 MONITOR 24 [facility] None  09/04/2016 1:15 PM MC-PHASE2 MONITOR 24 [facility] None  09/06/2016 1:15 PM MC-PHASE2 MONITOR 24 [facility] None  09/09/2016 1:15 PM MC-PHASE2 MONITOR 24 [facility] None  09/11/2016 1:15 PM MC-PHASE2 MONITOR 24 [facility] None  09/13/2016 1:15 PM MC-PHASE2 MONITOR 24 [facility] None  09/16/2016 1:15 PM MC-PHASE2 MONITOR 24 [facility] None  09/18/2016 1:15 PM MC-PHASE2 MONITOR 24 [facility] None  09/20/2016 1:15 PM MC-PHASE2 MONITOR 24 [facility] None  09/23/2016 1:15 PM MC-PHASE2 MONITOR 24 [facility] None      ACTION: Home visit completed Next visit planned for next wednesday

## 2016-07-04 ENCOUNTER — Inpatient Hospital Stay (HOSPITAL_COMMUNITY): Admission: RE | Admit: 2016-07-04 | Payer: Medicare Other | Source: Ambulatory Visit

## 2016-07-05 ENCOUNTER — Ambulatory Visit: Payer: Medicare Other | Admitting: Podiatry

## 2016-07-08 ENCOUNTER — Ambulatory Visit (HOSPITAL_COMMUNITY): Payer: Medicare Other

## 2016-07-10 ENCOUNTER — Ambulatory Visit (HOSPITAL_COMMUNITY): Payer: Medicare Other

## 2016-07-11 ENCOUNTER — Other Ambulatory Visit (HOSPITAL_COMMUNITY): Payer: Self-pay

## 2016-07-11 MED FILL — GABAPENTIN 300 MG CAPSULE: 300 | 30 days supply | Qty: 60 | Fill #3

## 2016-07-11 MED FILL — glipiZIDE 10 MG TABS: 10 | 30 days supply | Qty: 60 | Fill #2

## 2016-07-11 MED FILL — hydrALAZINE HCL 25 MG TABS: 25 | 30 days supply | Qty: 45 | Fill #3

## 2016-07-11 NOTE — Progress Notes (Signed)
Paramedicine Encounter    Patient ID: Jacob [name], male    DOB: Nov 16, 1955, 61 y.o.   MRN: [id number]    Patient Care Team: [name], MD as PCP - General (Internal Medicine) Clarisa Schools, RN as Registered Nurse Corbin Ade, MD as Consulting Physician (Gastroenterology)  Patient Active Problem List   Diagnosis Date Noted  . Congestive heart disease (HCC) 01/01/2016  . Diabetic neuropathy (HCC) 11/28/2015  . Ascites 11/09/2015  . CHF (congestive heart failure) (HCC) 11/09/2015  . Insomnia 04/18/2015  . Claudication of right lower extremity (HCC) 04/07/2015  . Cardiomyopathy, ischemic 04/07/2015  . Chronic anticoagulation 03/30/2015  . Cardiorenal syndrome with renal failure   . SOB (shortness of breath)   . Morbid obesity due to excess calories (HCC)   . Helicobacter pylori ab+ 12/12/2014  . Calf pain   . Duodenal ulcer with hemorrhage   . Hemorrhagic shock (HCC)   . Cardiogenic shock (HCC)   . Coronary artery disease involving native coronary artery of native heart without angina pectoris   . Atrial fibrillation with rapid ventricular response (HCC) 10/29/2014  . Chest pain   . Atherosclerosis of native arteries of extremity with intermittent claudication (HCC) 09/06/2014  . Atrial fibrillation with controlled ventricular response (HCC) 08/03/2014  . Bacteremia   . DM (diabetes mellitus), type 2 with peripheral vascular complications (HCC) 08/01/2014  . Anemia of chronic disease 08/01/2014  . PVD (peripheral vascular disease) (HCC) 10/29/2013  . Tobacco abuse 05/13/2013  . AF (atrial fibrillation) (HCC) 02/11/2013  . ICD - in place- BS May 2014 [facility] 02/11/2013  . Microcytic anemia 01/11/2013  . 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 Noncompliance 12/01/2011  . Essential hypertension 10/30/2006    Current Outpatient Prescriptions:  .  ACCU-CHEK SOFTCLIX LANCETS lancets, Use for once daily testing of blood sugar, Disp: 100 each, Rfl: 12 .   acetaminophen-codeine (TYLENOL #3) 300-30 MG tablet, Take 1 tablet by mouth every 12 (twelve) hours as needed for moderate pain., Disp: 60 tablet, Rfl: 0 .  albuterol (PROVENTIL HFA;VENTOLIN HFA) 108 (90 Base) MCG/ACT inhaler, Inhale 2 puffs into the lungs every 6 (six) hours as needed for wheezing or shortness of breath., Disp: 1 Inhaler, Rfl: 2 .  albuterol (PROVENTIL) (2.5 MG/3ML) 0.083% nebulizer solution, Take 3 mLs (2.5 mg total) by nebulization every 6 (six) hours as needed for wheezing or shortness of breath., Disp: 150 mL, Rfl: 1 .  amiodarone (PACERONE) 200 MG tablet, Take 1 tablet (200 mg total) by mouth daily., Disp: 30 tablet, Rfl: 3 .  atorvastatin (LIPITOR) 40 MG tablet, Take 1 tablet (40 mg total) by mouth daily at 6 PM., Disp: 30 tablet, Rfl: 1 .  Blood Glucose Monitoring Suppl (ACCU-CHEK AVIVA) device, Use as instructed1 times daily before meals, Disp: 1 each, Rfl: 0 .  digoxin (LANOXIN) 0.125 MG tablet, Take 0.5 tablets (0.0625 mg total) by mouth daily., Disp: 15 tablet, Rfl: 3 .  ferrous sulfate 325 (65 FE) MG tablet, TAKE 1 TABLET BY MOUTH 3 TIMES DAILY WITH MEALS., Disp: 90 tablet, Rfl: 2 .  gabapentin (NEURONTIN) 300 MG capsule, Take 1 capsule (300 mg total) by mouth 2 (two) times daily., Disp: 60 capsule, Rfl: 5 .  glipiZIDE (GLUCOTROL) 10 MG tablet, TAKE 1 TABLET BY MOUTH 2 TIMES DAILY BEFORE A MEAL., Disp: 60 tablet, Rfl: 2 .  glucose blood (ACCU-CHEK AVIVA) test strip, Use as instructed for 1 times daily testing of blood sugar, Disp: 100  each, Rfl: 12 .  hydrALAZINE (APRESOLINE) 25 MG tablet, Take 1 tablet (25 mg total) by mouth 3 (three) times daily., Disp: 90 tablet, Rfl: 3 .  Insulin Glargine (LANTUS) 100 UNIT/ML Solostar Pen, Inject 15 Units into the skin daily at 10 pm., Disp: 2 pen, Rfl: 5 .  Insulin Pen Needle 31G X 5 MM MISC, Use as directed for once daily insulin injection, Disp: 100 each, Rfl: 2 .  isosorbide mononitrate (IMDUR) 30 MG 24 hr tablet, Take 1 tablet (30  mg total) by mouth daily., Disp: 30 tablet, Rfl: 3 .  rivaroxaban (XARELTO) 20 MG TABS tablet, Take 1 tablet (20 mg total) by mouth daily before supper., Disp: 30 tablet, Rfl: 3 .  spironolactone (ALDACTONE) 25 MG tablet, Take 25 mg by mouth daily., Disp: , Rfl:  .  torsemide (DEMADEX) 20 MG tablet, Take 4 tablets (80 mg total) by mouth 2 (two) times daily., Disp: 240 tablet, Rfl: 3 .  lactulose (CHRONULAC) 10 GM/15ML solution, Take 15 mLs (10 g total) by mouth daily as needed for mild constipation., Disp: 240 mL, Rfl: 0 .  Lancet Devices (ACCU-CHEK SOFTCLIX) lancets, Use as instructed for once daily testing of blood sugar, Disp: 1 each, Rfl: 0 .  meclizine (ANTIVERT) 25 MG tablet, Take 25 mg by mouth 2 (two) times daily as needed for nausea., Disp: , Rfl:  .  predniSONE (DELTASONE) 20 MG tablet, Take 1 tablet (20 mg total) by mouth daily with breakfast., Disp: 5 tablet, Rfl: 0 Allergies  Allergen Reactions  . Tylenol [Acetaminophen] Itching  . Tape Itching    Paper tape please.     Social History   Social History  . Marital status: Divorced    Spouse name: N/A  . Number of children: 3  . Years of education: N/A   Occupational History  . disabled    Social History Main Topics  . Smoking status: Current Every Day Smoker    Packs/day: 0.50    Years: 40.00    Types: Cigarettes  . Smokeless tobacco: Never Used  . Alcohol use No  . Drug use: No  . Sexual activity: No   Other Topics Concern  . Not on file   Social History Narrative   Has an apartment with a roommate. He was living on the streets in Jan 28, 2013.  He reports that his father died in [place] in Jan 28, 2013.  He is divorced.  He is no longer estranged from his son, but still from his daughter who lives locally.  Neither of his parents, nor any siblings have any history of CAD.    Physical Exam  Eyes: Pupils are equal, round, and reactive to light.  Pulmonary/Chest: No respiratory distress. He has no wheezes. He  has no rales.  Abdominal: He exhibits no distension. There is no tenderness. There is no guarding.  Musculoskeletal: He exhibits no edema.  Skin: Skin is warm and dry. He is not diaphoretic.        Future Appointments Date Time Provider Department Center  07/22/2016 1:30 PM Jaclyn Shaggy, MD [facility] None  07/30/2016 2:00 PM [facility] PA/NP [facility] None  08/01/2016 1:30 PM [facility] PHASE II ORIENT [facility] None  08/05/2016 1:15 PM MC-PHASE2 MONITOR 24 [facility] None  08/07/2016 1:15 PM MC-PHASE2 MONITOR 24 [facility] None  08/09/2016 1:15 PM MC-PHASE2 MONITOR 24 [facility] None  08/12/2016 1:15 PM MC-PHASE2 MONITOR 24 [facility] None  08/13/2016 4:00 PM Helane Gunther, DPM [facility]  08/14/2016 1:15 PM MC-PHASE2 MONITOR 24  [facility] None  08/16/2016 1:15 PM MC-PHASE2 MONITOR 24 [facility] None  08/21/2016 1:15 PM MC-PHASE2 MONITOR 24 [facility] None  08/23/2016 1:15 PM MC-PHASE2 MONITOR 24 [facility] None  08/26/2016 1:15 PM MC-PHASE2 MONITOR 24 [facility] None  08/28/2016 1:15 PM MC-PHASE2 MONITOR 24 [facility] None  08/30/2016 1:15 PM MC-PHASE2 MONITOR 24 [facility] None  09/02/2016 1:15 PM MC-PHASE2 MONITOR 24 [facility] None  09/04/2016 1:15 PM MC-PHASE2 MONITOR 24 [facility] None  09/06/2016 1:15 PM MC-PHASE2 MONITOR 24 [facility] None  09/09/2016 1:15 PM MC-PHASE2 MONITOR 24 [facility] None  09/11/2016 1:15 PM MC-PHASE2 MONITOR 24 [facility] None  09/13/2016 1:15 PM MC-PHASE2 MONITOR 24 [facility] None  09/16/2016 1:15 PM MC-PHASE2 MONITOR 24 [facility] None  09/18/2016 1:15 PM MC-PHASE2 MONITOR 24 [facility] None  09/20/2016 1:15 PM MC-PHASE2 MONITOR 24 [facility] None  09/23/2016 1:15 PM MC-PHASE2 MONITOR 24 [facility] None  09/27/2016 1:15 PM MC-PHASE2 MONITOR 24 [facility] None  09/30/2016 1:15 PM MC-PHASE2 MONITOR 24 [facility] None  10/02/2016 1:15 PM MC-PHASE2 MONITOR 24 [facility] None  10/04/2016 1:15 PM MC-PHASE2 MONITOR 24 [facility] None  10/07/2016 1:15 PM MC-PHASE2 MONITOR 24 [facility] None  10/09/2016 1:15 PM MC-PHASE2 MONITOR 24  [facility] None  10/11/2016 1:15 PM MC-PHASE2 MONITOR 24 [facility] None  10/14/2016 1:15 PM MC-PHASE2 MONITOR 24 [facility] None  10/16/2016 1:15 PM MC-PHASE2 MONITOR 24 [facility] None  10/18/2016 1:15 PM MC-PHASE2 MONITOR 24 [facility] None  10/21/2016 1:15 PM MC-PHASE2 MONITOR 24 [facility] None  10/23/2016 1:15 PM MC-PHASE2 MONITOR 24 [facility] None  10/25/2016 1:15 PM MC-PHASE2 MONITOR 24 [facility] None  10/28/2016 1:15 PM MC-PHASE2 MONITOR 24 [facility] None    ATF pt CAO x4 laying across his bed with no complaints.  Pt has not taken his morning meds today due to his pill box being empty since last night. Pt missed [facility] yesterday.  Pt stated that he is now only eating vegtables and seafood; he has cut out the red meat and pork.  He stated that he can sleep flat now without difficulty.  Pt denies sob, dizziness, headache and chest pain. rx bottles verified and pill box refilled.    BP 118/84 (BP Location: Right Arm, Patient Position: Sitting, Cuff Size: Normal)   Pulse (!) 109   Resp 16   Wt 233 lb (105.7 kg)   SpO2 97%   BMI 38.77 kg/m  cbg 253  Pt runs out of gabapentin Thursday next week  **rx called in: Gabapentin Glipizide Hydralazine  Weight yesterday-232 Last visit weight-229  Isaiha Asare, EMT Paramedic 07/11/2016  ACTION: Home visit completed Next visit planned for next wednesday

## 2016-07-12 ENCOUNTER — Ambulatory Visit (HOSPITAL_COMMUNITY): Payer: Medicare Other

## 2016-07-15 ENCOUNTER — Ambulatory Visit (HOSPITAL_COMMUNITY): Payer: Medicare Other

## 2016-07-17 ENCOUNTER — Ambulatory Visit (HOSPITAL_COMMUNITY): Payer: Medicare Other

## 2016-07-19 ENCOUNTER — Ambulatory Visit (HOSPITAL_COMMUNITY): Payer: Medicare Other

## 2016-07-19 ENCOUNTER — Telehealth (HOSPITAL_COMMUNITY): Payer: Self-pay

## 2016-07-19 NOTE — Telephone Encounter (Signed)
Pt missed this weeks appointment, therefore I tried to f/u with him today.  Pt's pill box should be empty and needs re-filled.  I left a message on pt's cell phone and asked [facility] zak to f/u with pt on Monday.

## 2016-07-22 ENCOUNTER — Encounter: Payer: Self-pay | Admitting: Family Medicine

## 2016-07-22 ENCOUNTER — Ambulatory Visit (HOSPITAL_COMMUNITY): Payer: Medicare Other

## 2016-07-22 ENCOUNTER — Telehealth: Payer: Self-pay

## 2016-07-22 ENCOUNTER — Ambulatory Visit: Payer: Medicare Other | Attending: Family Medicine | Admitting: Family Medicine

## 2016-07-22 VITALS — BP 161/81 | HR 112 | Temp 98.1°F | Ht 65.0 in | Wt 241.6 lb

## 2016-07-22 DIAGNOSIS — Z951 Presence of aortocoronary bypass graft: Secondary | ICD-10-CM | POA: Insufficient documentation

## 2016-07-22 DIAGNOSIS — I779 Disorder of arteries and arterioles, unspecified: Secondary | ICD-10-CM | POA: Diagnosis not present

## 2016-07-22 DIAGNOSIS — E7800 Pure hypercholesterolemia, unspecified: Secondary | ICD-10-CM | POA: Diagnosis not present

## 2016-07-22 DIAGNOSIS — I5042 Chronic combined systolic (congestive) and diastolic (congestive) heart failure: Secondary | ICD-10-CM | POA: Diagnosis not present

## 2016-07-22 DIAGNOSIS — E1159 Type 2 diabetes mellitus with other circulatory complications: Secondary | ICD-10-CM

## 2016-07-22 DIAGNOSIS — E1165 Type 2 diabetes mellitus with hyperglycemia: Secondary | ICD-10-CM | POA: Insufficient documentation

## 2016-07-22 DIAGNOSIS — E1151 Type 2 diabetes mellitus with diabetic peripheral angiopathy without gangrene: Secondary | ICD-10-CM | POA: Diagnosis not present

## 2016-07-22 DIAGNOSIS — I252 Old myocardial infarction: Secondary | ICD-10-CM | POA: Insufficient documentation

## 2016-07-22 DIAGNOSIS — Z7901 Long term (current) use of anticoagulants: Secondary | ICD-10-CM | POA: Insufficient documentation

## 2016-07-22 DIAGNOSIS — Z95810 Presence of automatic (implantable) cardiac defibrillator: Secondary | ICD-10-CM | POA: Diagnosis not present

## 2016-07-22 DIAGNOSIS — Z794 Long term (current) use of insulin: Secondary | ICD-10-CM

## 2016-07-22 DIAGNOSIS — Z79899 Other long term (current) drug therapy: Secondary | ICD-10-CM | POA: Diagnosis not present

## 2016-07-22 DIAGNOSIS — I110 Hypertensive heart disease with heart failure: Secondary | ICD-10-CM | POA: Diagnosis not present

## 2016-07-22 DIAGNOSIS — I4891 Unspecified atrial fibrillation: Secondary | ICD-10-CM | POA: Diagnosis not present

## 2016-07-22 DIAGNOSIS — I2510 Atherosclerotic heart disease of native coronary artery without angina pectoris: Secondary | ICD-10-CM | POA: Diagnosis not present

## 2016-07-22 DIAGNOSIS — E119 Type 2 diabetes mellitus without complications: Secondary | ICD-10-CM | POA: Diagnosis present

## 2016-07-22 DIAGNOSIS — I10 Essential (primary) hypertension: Secondary | ICD-10-CM | POA: Diagnosis not present

## 2016-07-22 DIAGNOSIS — I25811 Atherosclerosis of native coronary artery of transplanted heart without angina pectoris: Secondary | ICD-10-CM

## 2016-07-22 LAB — GLUCOSE, POCT (MANUAL RESULT ENTRY): POC GLUCOSE: 352 mg/dL — AB (ref 70–99)

## 2016-07-22 MED ORDER — INSULIN GLARGINE 100 UNIT/ML SOLOSTAR PEN: 15.0000 [IU] | PEN_INJECTOR | Freq: Every day | SUBCUTANEOUS | 5 refills | Status: DC

## 2016-07-22 MED ORDER — GABAPENTIN 300 MG PO CAPS: 300.0000 mg | ORAL_CAPSULE | Freq: Two times a day (BID) | ORAL | 5 refills | Status: DC

## 2016-07-22 MED ORDER — GLIPIZIDE 10 MG PO TABS: ORAL_TABLET | ORAL | 5 refills | Status: DC

## 2016-07-22 MED ORDER — ATORVASTATIN CALCIUM 40 MG PO TABS: 40.0000 mg | ORAL_TABLET | Freq: Every day | ORAL | 5 refills | Status: DC

## 2016-07-22 MED ORDER — ATORVASTATIN CALCIUM 40 MG PO TABS
40.0000 mg | ORAL_TABLET | Freq: Every day | ORAL | 5 refills | Status: DC
Start: 2016-07-22 — End: 2016-11-11

## 2016-07-22 NOTE — Progress Notes (Signed)
Medication refills- lantus

## 2016-07-22 NOTE — Progress Notes (Signed)
Subjective:  Patient ID: Jacob [name], male    DOB: September 13, 1955  Age: 61 y.o. 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 He has also not been compliant with his diet and has been eating a lot of pancakes.  He is currently under the paramedicine program and reports doing well but is yet to be seen by cardiology.  Blood sugars at home have been in the 150-200 range as per patient. He denies hypoglycemia but does have some peripheral neuropathy which is controlled with gabapentin. He is yet to see an ophthalmologist or podiatrist.  He had complained of right shoulder pain at his last office visit for which he was prescribed prednisone but the patient never took this; states he is unable to take prednisone.  Past Medical History:  Diagnosis Date  . Atrial fibrillation (HCC)    RVR 10/2014  . Automatic implantable cardioverter-defibrillator in situ   . CAD (coronary artery disease) Sept 2013   s/p cardiac cath showing occlusion of small RCA with collaterals  . CHF (congestive heart failure) (HCC)    20 to 25 % EF and RV dysfunction by 07/2014 echo   . Chronic anticoagulation    on xarelto.   . High cholesterol   . Hypertension   . Myocardial infarction (HCC) 2014  . Noncompliance    homelessness contributing.   . Peripheral arterial disease (HCC)   . Type II diabetes mellitus (HCC)     Past Surgical History:  Procedure Laterality Date  . CARDIAC CATHETERIZATION  09/2012  . CORONARY ANGIOPLASTY WITH STENT PLACEMENT  11/2011   "1"  . CORONARY  ARTERY BYPASS GRAFT  09/2012   2 vessels per patient [name]   . ESOPHAGOGASTRODUODENOSCOPY N/A 11/28/2014   Procedure: ESOPHAGOGASTRODUODENOSCOPY (EGD);  Surgeon: Beverley Fiedler, MD;  Location: [facility] ENDOSCOPY;  Service: Endoscopy;  Laterality: N/A;  . ILIAC ARTERY STENT Right 08/30/2013  . IMPLANTABLE CARDIOVERTER DEFIBRILLATOR IMPLANT     [place] in 07/2012; [employer]  . LEFT AND RIGHT HEART CATHETERIZATION WITH CORONARY ANGIOGRAM N/A 12/02/2011   Procedure: LEFT AND RIGHT HEART CATHETERIZATION WITH CORONARY ANGIOGRAM;  Surgeon: Kathleene Hazel, MD;  Location[facility] CATH LAB;  Service: Cardiovascular;  Laterality: N/A;  . LOWER EXTREMITY ANGIOGRAM N/A 08/30/2013   Procedure: LOWER EXTREMITY ANGIOGRAM;  Surgeon: Runell Gess, MD;  Location: [facility] CATH LAB;  Service: Cardiovascular;  Laterality: N/A;  . LOWER EXTREMITY ANGIOGRAM N/A 12/02/2013   Procedure: LOWER EXTREMITY ANGIOGRAM;  Surgeon: Runell Gess, MD;  Location: [facility] CATH LAB;  Service: Cardiovascular;  Laterality: N/A;     Outpatient Medications Prior to Visit  Medication Sig Dispense Refill  . ACCU-CHEK SOFTCLIX LANCETS lancets Use for once daily testing of blood sugar 100 each 12  . albuterol (PROVENTIL HFA;VENTOLIN HFA) 108 (90 Base) MCG/ACT inhaler Inhale 2 puffs into the lungs every 6 (six) hours as needed for wheezing or shortness of breath. 1 Inhaler 2  . amiodarone (PACERONE) 200 MG tablet Take 1 tablet (200 mg total) by mouth daily. 30 tablet 3  . Blood Glucose Monitoring Suppl (  ACCU-CHEK AVIVA) device Use as instructed1 times daily before meals 1 each 0  . digoxin (LANOXIN) 0.125 MG tablet Take 0.5 tablets (0.0625 mg total) by mouth daily. 15 tablet 3  . ferrous sulfate 325 (65 FE) MG tablet TAKE 1 TABLET BY MOUTH 3 TIMES DAILY WITH MEALS. 90 tablet 2  . glucose blood (ACCU-CHEK AVIVA) test strip Use as instructed for 1 times daily testing of blood sugar 100 each 12  . hydrALAZINE (APRESOLINE) 25 MG tablet Take 1 tablet  (25 mg total) by mouth 3 (three) times daily. 90 tablet 3  . Insulin Pen Needle 31G X 5 MM MISC Use as directed for once daily insulin injection 100 each 2  . isosorbide mononitrate (IMDUR) 30 MG 24 hr tablet Take 1 tablet (30 mg total) by mouth daily. 30 tablet 3  . lactulose (CHRONULAC) 10 GM/15ML solution Take 15 mLs (10 g total) by mouth daily as needed for mild constipation. 240 mL 0  . Lancet Devices (ACCU-CHEK SOFTCLIX) lancets Use as instructed for once daily testing of blood sugar 1 each 0  . rivaroxaban (XARELTO) 20 MG TABS tablet Take 1 tablet (20 mg total) by mouth daily before supper. 30 tablet 3  . spironolactone (ALDACTONE) 25 MG tablet Take 25 mg by mouth daily.    Marland Kitchen torsemide (DEMADEX) 20 MG tablet Take 4 tablets (80 mg total) by mouth 2 (two) times daily. 240 tablet 3  . atorvastatin (LIPITOR) 40 MG tablet Take 1 tablet (40 mg total) by mouth daily at 6 PM. 30 tablet 1  . gabapentin (NEURONTIN) 300 MG capsule Take 1 capsule (300 mg total) by mouth 2 (two) times daily. 60 capsule 5  . glipiZIDE (GLUCOTROL) 10 MG tablet TAKE 1 TABLET BY MOUTH 2 TIMES DAILY BEFORE A MEAL. 60 tablet 2  . Insulin Glargine (LANTUS) 100 UNIT/ML Solostar Pen Inject 15 Units into the skin daily at 10 pm. 2 pen 5  . acetaminophen-codeine (TYLENOL #3) 300-30 MG tablet Take 1 tablet by mouth every 12 (twelve) hours as needed for moderate pain. (Patient not taking: Reported on 07/22/2016) 60 tablet 0  . albuterol (PROVENTIL) (2.5 MG/3ML) 0.083% nebulizer solution Take 3 mLs (2.5 mg total) by nebulization every 6 (six) hours as needed for wheezing or shortness of breath. (Patient not taking: Reported on 07/22/2016) 150 mL 1  . meclizine (ANTIVERT) 25 MG tablet Take 25 mg by mouth 2 (two) times daily as needed for nausea.    . predniSONE (DELTASONE) 20 MG tablet Take 1 tablet (20 mg total) by mouth daily with breakfast. (Patient not taking: Reported on 07/22/2016) 5 tablet 0   No facility-administered medications  prior to visit.     ROS Review of Systems Constitutional: Negative for activity change and appetite change.  HENT: Negative for sinus pressure and sore throat.   Eyes: Negative for visual disturbance.  Respiratory: Negative for cough, chest tightness and shortness of breath.   Cardiovascular: Negative for chest pain and leg swelling.  Gastrointestinal: Negative for abdominal distention, abdominal pain, constipation and diarrhea.  Endocrine: Negative.   Genitourinary: Negative for dysuria.  Musculoskeletal: see hpi.  Skin: Negative for rash.  Allergic/Immunologic: Negative.   Neurological: Negative for weakness, light-headedness and numbness.  Psychiatric/Behavioral: Negative for dysphoric mood and suicidal ideas.  Objective:  BP (!) 161/81 (BP Location: Right Arm, Patient Position: Sitting, Cuff Size: Small)   Pulse (!) 112   Temp 98.1 F (36.7 C) (Oral)   Ht 5\' 5"  (1.651 m)  Wt 241 lb 9.6 oz (109.6 kg)   SpO2 95%   BMI 40.20 kg/m   BP/Weight 07/22/2016 07/11/2016 07/03/2016  Systolic BP 161 118 120  Diastolic BP 81 84 62  Wt. (Lbs) 241.6 233 229.4  BMI 40.2 38.77 38.17      Physical Exam Constitutional: He is oriented to person, place, and time. He appears well-developed and well-nourished.  Neck: No JVD present.  Cardiovascular: tachycardic rate, normal heart sounds and intact distal pulses.   No murmur heard. Pulmonary/Chest: Effort normal and breath sounds normal. He has no wheezes. He has no rales. He exhibits no tenderness.  Abdominal: Soft. Bowel sounds are normal. He exhibits no distension and no mass. There is no tenderness.  Musculoskeletal: Slight edema and tenderness of  right MCP joint of the pump No tenderness to palpation of shoulder joint. Decreased range of motion in right shoulder-abduction restricted to 120; left shoulder is normal.  Neurological: He is alert and oriented to person, place, and time.  Skin: Skin is warm and dry.  Psychiatric: He has  a normal mood and affect.   Lab Results  Component Value Date   HGBA1C 7.9 06/06/2016     Assessment & Plan:   1. DM (diabetes mellitus), type 2 with peripheral vascular complications (HCC) A1c of 7.9 CBG revealed hyperglycemia today due to poor dietary indiscretion and running out of Lantus Medications refilled Referred to podiatry but the patient canceled; will need to reschedule. - Glucose (CBG)  2. Type 2 diabetes mellitus with other circulatory complication, with long-term current use of insulin (HCC) Stable Keep appointment with vascular  - gabapentin (NEURONTIN) 300 MG capsule; Take 1 capsule (300 mg total) by mouth 2 (two) times daily.  Dispense: 60 capsule; Refill: 5  3. Coronary artery disease involving native artery of transplanted heart without angina pectoris Asymptomatic at this time Risk factor modification - atorvastatin (LIPITOR) 40 MG tablet; Take 1 tablet (40 mg total) by mouth daily at 6 PM.  Dispense: 30 tablet; Refill: 5  4. Essential hypertension Uncontrolled Encouraged to stop at the pharmacy to pick up all his antihypertensives  5. Chronic combined systolic and diastolic congestive heart failure (HCC) EF 25-30% Euvolemic Restricted daily fluid intake to 2 L per day, low-sodium cardiac diet, daily weight checks Continue medications Emphasized the need to schedule a cardiology appointment.  6. Atrial fibrillation with rapid ventricular response (HCC) Rate control with amiodarone and digoxin Anticoagulation with Xarelto   Meds ordered this encounter  Medications  . gabapentin (NEURONTIN) 300 MG capsule    Sig: Take 1 capsule (300 mg total) by mouth 2 (two) times daily.    Dispense:  60 capsule    Refill:  5  . Insulin Glargine (LANTUS) 100 UNIT/ML Solostar Pen    Sig: Inject 15 Units into the skin daily at 10 pm.    Dispense:  2 pen    Refill:  5    Discontinue previous dose  . atorvastatin (LIPITOR) 40 MG tablet    Sig: Take 1 tablet (40  mg total) by mouth daily at 6 PM.    Dispense:  30 tablet    Refill:  5  . glipiZIDE (GLUCOTROL) 10 MG tablet    Sig: TAKE 1 TABLET BY MOUTH 2 TIMES DAILY BEFORE A MEAL.    Dispense:  60 tablet    Refill:  5    Follow-up: Return in about 3 months (around 10/21/2016) for Follow up on diabetes mellitus.   Jaclyn Shaggy MD

## 2016-07-22 NOTE — Telephone Encounter (Signed)
Met with the patient when he was in the clinic today for his appointment. He stated that he continues to receive PCS 1-1.5 hrs x 5 days /week. He also noted that he still has weekly visits from paramedicine to pre-fill his medication box.

## 2016-07-24 ENCOUNTER — Ambulatory Visit (HOSPITAL_COMMUNITY): Payer: Medicare Other

## 2016-07-24 ENCOUNTER — Other Ambulatory Visit (HOSPITAL_COMMUNITY): Payer: Self-pay

## 2016-07-24 NOTE — Progress Notes (Signed)
Paramedicine Encounter    Patient ID: Jacob [name], male    DOB: [birth year], 61 y.o.   MRN: [id number]    Patient Care Team: [name], MD as PCP - General (Internal Medicine) Clarisa Schools, RN as Registered Nurse Corbin Ade, MD as Consulting Physician (Gastroenterology)  Patient Active Problem List   Diagnosis Date Noted  . Congestive heart disease (HCC) 01/01/2016  . Diabetic neuropathy (HCC) 11/28/2015  . Ascites 11/09/2015  . CHF (congestive heart failure) (HCC) 11/09/2015  . Insomnia 04/18/2015  . Claudication of right lower extremity (HCC) 04/07/2015  . Cardiomyopathy, ischemic 04/07/2015  . Chronic anticoagulation 03/30/2015  . Cardiorenal syndrome with renal failure   . SOB (shortness of breath)   . Morbid obesity due to excess calories (HCC)   . Helicobacter pylori ab+ 12/12/2014  . Calf pain   . Duodenal ulcer with hemorrhage   . Hemorrhagic shock (HCC)   . Cardiogenic shock (HCC)   . Coronary artery disease involving native coronary artery of native heart without angina pectoris   . Atrial fibrillation with rapid ventricular response (HCC) 10/29/2014  . Chest pain   . Atherosclerosis of native arteries of extremity with intermittent claudication (HCC) 09/06/2014  . Atrial fibrillation with controlled ventricular response (HCC) 08/03/2014  . Bacteremia   . DM (diabetes mellitus), type 2 with peripheral vascular complications (HCC) 08/01/2014  . Anemia of chronic disease 08/01/2014  . PVD (peripheral vascular disease) (HCC) 10/29/2013  . Tobacco abuse 05/13/2013  . AF (atrial fibrillation) (HCC) 02/11/2013  . ICD - in place- BS May 2014 [facility] 02/11/2013  . Microcytic anemia 01/11/2013  . CAD- s/p CABG July 2014 [facility] 12/04/2011  . Noncompliance 12/01/2011  . Essential hypertension 10/30/2006    Current Outpatient Prescriptions:  .  amiodarone (PACERONE) 200 MG tablet, Take 1 tablet (200 mg total) by mouth daily., Disp: 30 tablet, Rfl:  3 .  atorvastatin (LIPITOR) 40 MG tablet, Take 1 tablet (40 mg total) by mouth daily at 6 PM., Disp: 30 tablet, Rfl: 5 .  digoxin (LANOXIN) 0.125 MG tablet, Take 0.5 tablets (0.0625 mg total) by mouth daily., Disp: 15 tablet, Rfl: 3 .  ferrous sulfate 325 (65 FE) MG tablet, TAKE 1 TABLET BY MOUTH 3 TIMES DAILY WITH MEALS., Disp: 90 tablet, Rfl: 2 .  gabapentin (NEURONTIN) 300 MG capsule, Take 1 capsule (300 mg total) by mouth 2 (two) times daily., Disp: 60 capsule, Rfl: 5 .  glipiZIDE (GLUCOTROL) 10 MG tablet, TAKE 1 TABLET BY MOUTH 2 TIMES DAILY BEFORE A MEAL., Disp: 60 tablet, Rfl: 5 .  glucose blood (ACCU-CHEK AVIVA) test strip, Use as instructed for 1 times daily testing of blood sugar, Disp: 100 each, Rfl: 12 .  hydrALAZINE (APRESOLINE) 25 MG tablet, Take 1 tablet (25 mg total) by mouth 3 (three) times daily., Disp: 90 tablet, Rfl: 3 .  Insulin Glargine (LANTUS) 100 UNIT/ML Solostar Pen, Inject 15 Units into the skin daily at 10 pm., Disp: 2 pen, Rfl: 5 .  Insulin Pen Needle 31G X 5 MM MISC, Use as directed for once daily insulin injection, Disp: 100 each, Rfl: 2 .  isosorbide mononitrate (IMDUR) 30 MG 24 hr tablet, Take 1 tablet (30 mg total) by mouth daily., Disp: 30 tablet, Rfl: 3 .  lactulose (CHRONULAC) 10 GM/15ML solution, Take 15 mLs (10 g total) by mouth daily as needed for mild constipation., Disp: 240 mL, Rfl: 0 .  Lancet Devices (ACCU-CHEK SOFTCLIX) lancets, Use as instructed for  once daily testing of blood sugar, Disp: 1 each, Rfl: 0 .  meclizine (ANTIVERT) 25 MG tablet, Take 25 mg by mouth 2 (two) times daily as needed for nausea., Disp: , Rfl:  .  rivaroxaban (XARELTO) 20 MG TABS tablet, Take 1 tablet (20 mg total) by mouth daily before supper., Disp: 30 tablet, Rfl: 3 .  spironolactone (ALDACTONE) 25 MG tablet, Take 25 mg by mouth daily., Disp: , Rfl:  .  torsemide (DEMADEX) 20 MG tablet, Take 4 tablets (80 mg total) by mouth 2 (two) times daily., Disp: 240 tablet, Rfl: 3 .   ACCU-CHEK SOFTCLIX LANCETS lancets, Use for once daily testing of blood sugar, Disp: 100 each, Rfl: 12 .  acetaminophen-codeine (TYLENOL #3) 300-30 MG tablet, Take 1 tablet by mouth every 12 (twelve) hours as needed for moderate pain. (Patient not taking: Reported on 07/22/2016), Disp: 60 tablet, Rfl: 0 .  albuterol (PROVENTIL HFA;VENTOLIN HFA) 108 (90 Base) MCG/ACT inhaler, Inhale 2 puffs into the lungs every 6 (six) hours as needed for wheezing or shortness of breath., Disp: 1 Inhaler, Rfl: 2 .  albuterol (PROVENTIL) (2.5 MG/3ML) 0.083% nebulizer solution, Take 3 mLs (2.5 mg total) by nebulization every 6 (six) hours as needed for wheezing or shortness of breath. (Patient not taking: Reported on 07/22/2016), Disp: 150 mL, Rfl: 1 .  Blood Glucose Monitoring Suppl (ACCU-CHEK AVIVA) device, Use as instructed1 times daily before meals, Disp: 1 each, Rfl: 0 Allergies  Allergen Reactions  . Tylenol [Acetaminophen] Itching  . Tape Itching    Paper tape please.     Social History   Social History  . Marital status: Divorced    Spouse name: N/A  . Number of children: 3  . Years of education: N/A   Occupational History  . disabled    Social History Main Topics  . Smoking status: Current Every Day Smoker    Packs/day: 0.50    Years: 40.00    Types: Cigarettes  . Smokeless tobacco: Never Used  . Alcohol use No  . Drug use: No  . Sexual activity: No   Other Topics Concern  . Not on file   Social History Narrative   Has an apartment with a roommate. He was living on the streets in 01/11/13.  He reports that his father died in [place] in 01/11/13.  He is divorced.  He is no longer estranged from his son, but still from his daughter who lives locally.  Neither of his parents, nor any siblings have any history of CAD.    Physical Exam  Pulmonary/Chest: No respiratory distress. He has no wheezes. He has no rales.  Abdominal: Soft. He exhibits distension. There is no tenderness. There  is no guarding.  Musculoskeletal: He exhibits no edema.  Skin: Skin is warm and dry. He is not diaphoretic.        Future Appointments Date Time Provider Department Center  07/30/2016 2:00 PM [facility] PA/NP [facility] None  08/01/2016 1:30 PM [facility] PHASE II ORIENT [facility] None  08/05/2016 1:15 PM [facility]-PH[facility]2 MONITOR 24 [facility] None  08/07/2016 1:15 PM MC-PHASE2 MONITOR 24 [facility] None  08/09/2016 1:15 PM MC-PH[facility]2 MONITOR 24 [facility] None  08/12/2016 1:15 PM MC-PH[facility]2 MONITOR 24 [facility] None  08/13/2016 4:00 PM Helane Gunther, DPM [facility]  08/14/2016 1:15 PM MC-PHASE2 MONITOR 24 [facility] None  08/16/2016 1:15 PM MC-PHASE2 MONITOR 24 [facility] None  08/21/2016 1:15 PM MC-PHASE2 MONITOR 24 [facility] None  08/23/2016 1:15 PM MC-PHASE2 MONITOR 24 [facility] None  08/26/2016 1:15 PM MC-PHASE2 MONITOR 24 [facility] None  08/28/2016 1:15 PM MC-PHASE2 MONITOR 24 [facility] None  08/30/2016 1:15 PM MC-PHASE2 MONITOR 24 [facility] None  09/02/2016 1:15 PM MC-PHASE2 MONITOR 24 [facility] None  09/04/2016 1:15 PM MC-PHASE2 MONITOR 24 [facility] None  09/06/2016 1:15 PM MC-PHASE2 MONITOR 24 [facility] None  09/09/2016 1:15 PM MC-PHASE2 MONITOR 24 [facility] None  09/11/2016 1:15 PM MC-PHASE2 MONITOR 24 [facility] None  09/13/2016 1:15 PM MC-PHASE2 MONITOR 24 [facility] None  09/16/2016 1:15 PM MC-PHASE2 MONITOR 24 [facility] None  09/18/2016 1:15 PM MC-PHASE2 MONITOR 24 [facility] None  09/20/2016 1:15 PM MC-PHASE2 MONITOR 24 [facility] None  09/23/2016 1:15 PM MC-PHASE2 MONITOR 24 [facility] None  09/27/2016 1:15 PM MC-PHASE2 MONITOR 24 [facility] None  09/30/2016 1:15 PM MC-PHASE2 MONITOR 24 [facility] None  10/02/2016 1:15 PM MC-PHASE2 MONITOR 24 [facility] None  10/04/2016 1:15 PM MC-PHASE2 MONITOR 24 [facility] None  10/07/2016 1:15 PM MC-PHASE2 MONITOR 24 [facility] None  10/09/2016 1:15 PM MC-PHASE2 MONITOR 24 [facility] None  10/11/2016 1:15 PM MC-PHASE2 MONITOR 24 [facility] None  10/14/2016 1:15 PM MC-PHASE2 MONITOR 24 [facility] None  10/16/2016  1:15 PM MC-PHASE2 MONITOR 24 [facility] None  10/18/2016 1:15 PM MC-PHASE2 MONITOR 24 [facility] None  10/21/2016 1:15 PM MC-PHASE2 MONITOR 24 [facility] None  10/23/2016 1:15 PM MC-PHASE2 MONITOR 24 [facility] None  10/25/2016 1:15 PM MC-PHASE2 MONITOR 24 [facility] None  10/28/2016 1:15 PM MC-PHASE2 MONITOR 24 [facility] None    ATF pt CAO x4 just waking up.  Pt's face (nose/cheeks) and abdomen appears to be swollen. pt missed several chp appointments last week, therefore his pill box was not refilled.  zak tried to f/u with pt Monday due to pt's pill box needing refill but pt was out of town.  Today pt has two days worth of medications that were not taken.  Pt stated that he took his medications from his bottles..  Pt has not been weighing himself daily. Pt's weight is up 8lbs since our last visit.  Pt stated that he "doesn't know why he has gained weight, maybe its the medications".   Pt reports that he has issues with sleeping and urinating.  Pt stated that he is waking up throughout the night and staying awake.  Pt stated that he is urinating less than normal.  rx bottles verified and pill box refilled.  [facility] called and notified of pt's weight gain via voice mail.   **rx called in:  Ferrous sulfate (filled until next tue morning)  BP 122/70 (BP Location: Right Arm, Patient Position: Sitting, Cuff Size: Normal)   Pulse (!) 106   Resp 16   Wt 241 lb (109.3 kg)   SpO2 98%   BMI 40.10 kg/m  cbg 321  Weight yesterday-didn't weigh Two days ago- 236 Last visit weight-233 (07/11/16)  Lometa Riggin, EMT Paramedic 07/24/2016  ACTION: Home visit completed Next visit planned for next wednesday

## 2016-07-26 ENCOUNTER — Ambulatory Visit (HOSPITAL_COMMUNITY): Payer: Medicare Other

## 2016-07-29 ENCOUNTER — Ambulatory Visit (HOSPITAL_COMMUNITY): Payer: Medicare Other

## 2016-07-30 ENCOUNTER — Telehealth (HOSPITAL_COMMUNITY): Payer: Self-pay | Admitting: Internal Medicine

## 2016-07-30 ENCOUNTER — Encounter (HOSPITAL_COMMUNITY): Payer: Medicare Other

## 2016-07-30 NOTE — Telephone Encounter (Signed)
Update: Verified Medicare A/B insurance through [employer]. Reference [phone].Marland Kitchen... KJ

## 2016-07-31 ENCOUNTER — Ambulatory Visit (HOSPITAL_COMMUNITY): Payer: Medicare Other

## 2016-08-01 ENCOUNTER — Ambulatory Visit (HOSPITAL_COMMUNITY): Payer: Medicare Other

## 2016-08-02 ENCOUNTER — Ambulatory Visit (HOSPITAL_COMMUNITY): Payer: Medicare Other

## 2016-08-05 ENCOUNTER — Ambulatory Visit (HOSPITAL_COMMUNITY): Payer: Medicare Other

## 2016-08-05 NOTE — Addendum Note (Signed)
Encounter addended by: Noralee Space, RN on: 08/05/2016 11:40 AM<BR>    Actions taken: Visit diagnoses modified, Order list changed, Diagnosis association updated

## 2016-08-07 ENCOUNTER — Other Ambulatory Visit (HOSPITAL_COMMUNITY): Payer: Self-pay

## 2016-08-07 ENCOUNTER — Telehealth (HOSPITAL_COMMUNITY): Payer: Self-pay | Admitting: *Deleted

## 2016-08-07 ENCOUNTER — Ambulatory Visit (HOSPITAL_COMMUNITY): Payer: Medicare Other

## 2016-08-07 MED FILL — ISOSORBIDE MN ER 30 MG TAB: 30 | 30 days supply | Qty: 30 | Fill #3

## 2016-08-07 NOTE — Progress Notes (Signed)
Paramedicine Encounter    Patient ID: Jacob [name], male    DOB: [birth year], 61 y.o.   MRN: [id number]    Patient Care Team: [name], MD as PCP - General (Internal Medicine) Clarisa Schools, RN as Registered Nurse Jena Gauss, Gerrit Friends, MD as Consulting Physician (Gastroenterology)  Patient Active Problem List   Diagnosis Date Noted  . Congestive heart disease (HCC) 01/01/2016  . Diabetic neuropathy (HCC) 11/28/2015  . Ascites 11/09/2015  . CHF (congestive heart failure) (HCC) 11/09/2015  . Insomnia 04/18/2015  . Claudication of right lower extremity (HCC) 04/07/2015  . Cardiomyopathy, ischemic 04/07/2015  . Chronic anticoagulation 03/30/2015  . Cardiorenal syndrome with renal failure   . SOB (shortness of breath)   . Morbid obesity due to excess calories (HCC)   . Helicobacter pylori ab+ 12/12/2014  . Calf pain   . Duodenal ulcer with hemorrhage   . Hemorrhagic shock (HCC)   . Cardiogenic shock (HCC)   . Coronary artery disease involving native coronary artery of native heart without angina pectoris   . Atrial fibrillation with rapid ventricular response (HCC) 10/29/2014  . Chest pain   . Atherosclerosis of native arteries of extremity with intermittent claudication (HCC) 09/06/2014  . Atrial fibrillation with controlled ventricular response (HCC) 08/03/2014  . Bacteremia   . DM (diabetes mellitus), type 2 with peripheral vascular complications (HCC) 08/01/2014  . Anemia of chronic disease 08/01/2014  . PVD (peripheral vascular disease) (HCC) 10/29/2013  . Tobacco abuse 05/13/2013  . AF (atrial fibrillation) (HCC) 02/11/2013  . ICD - in place- BS May 2014 [facility] 02/11/2013  . Microcytic anemia 01/11/2013  . CAD- s/p CABG July 2014 [facility] 12/04/2011  . Noncompliance 12/01/2011  . Essential hypertension 10/30/2006    Current Outpatient Prescriptions:  .  albuterol (PROVENTIL HFA;VENTOLIN HFA) 108 (90 Base) MCG/ACT inhaler, Inhale 2 puffs into the lungs  every 6 (six) hours as needed for wheezing or shortness of breath., Disp: 1 Inhaler, Rfl: 2 .  amiodarone (PACERONE) 200 MG tablet, Take 1 tablet (200 mg total) by mouth daily., Disp: 30 tablet, Rfl: 3 .  atorvastatin (LIPITOR) 40 MG tablet, Take 1 tablet (40 mg total) by mouth daily at 6 PM., Disp: 30 tablet, Rfl: 5 .  digoxin (LANOXIN) 0.125 MG tablet, Take 0.5 tablets (0.0625 mg total) by mouth daily., Disp: 15 tablet, Rfl: 3 .  gabapentin (NEURONTIN) 300 MG capsule, Take 1 capsule (300 mg total) by mouth 2 (two) times daily., Disp: 60 capsule, Rfl: 5 .  glipiZIDE (GLUCOTROL) 10 MG tablet, TAKE 1 TABLET BY MOUTH 2 TIMES DAILY BEFORE A MEAL., Disp: 60 tablet, Rfl: 5 .  hydrALAZINE (APRESOLINE) 25 MG tablet, Take 1 tablet (25 mg total) by mouth 3 (three) times daily., Disp: 90 tablet, Rfl: 3 .  Insulin Glargine (LANTUS) 100 UNIT/ML Solostar Pen, Inject 15 Units into the skin daily at 10 pm., Disp: 2 pen, Rfl: 5 .  Insulin Pen Needle 31G X 5 MM MISC, Use as directed for once daily insulin injection, Disp: 100 each, Rfl: 2 .  isosorbide mononitrate (IMDUR) 30 MG 24 hr tablet, Take 1 tablet (30 mg total) by mouth daily., Disp: 30 tablet, Rfl: 3 .  Lancet Devices (ACCU-CHEK SOFTCLIX) lancets, Use as instructed for once daily testing of blood sugar, Disp: 1 each, Rfl: 0 .  rivaroxaban (XARELTO) 20 MG TABS tablet, Take 1 tablet (20 mg total) by mouth daily before supper., Disp: 30 tablet, Rfl: 3 .  spironolactone (ALDACTONE) 25  MG tablet, Take 25 mg by mouth daily., Disp: , Rfl:  .  torsemide (DEMADEX) 20 MG tablet, Take 4 tablets (80 mg total) by mouth 2 (two) times daily., Disp: 240 tablet, Rfl: 3 .  ACCU-CHEK SOFTCLIX LANCETS lancets, Use for once daily testing of blood sugar, Disp: 100 each, Rfl: 12 .  acetaminophen-codeine (TYLENOL #3) 300-30 MG tablet, Take 1 tablet by mouth every 12 (twelve) hours as needed for moderate pain. (Patient not taking: Reported on 07/22/2016), Disp: 60 tablet, Rfl: 0 .   albuterol (PROVENTIL) (2.5 MG/3ML) 0.083% nebulizer solution, Take 3 mLs (2.5 mg total) by nebulization every 6 (six) hours as needed for wheezing or shortness of breath. (Patient not taking: Reported on 07/22/2016), Disp: 150 mL, Rfl: 1 .  Blood Glucose Monitoring Suppl (ACCU-CHEK AVIVA) device, Use as instructed1 times daily before meals, Disp: 1 each, Rfl: 0 .  ferrous sulfate 325 (65 FE) MG tablet, TAKE 1 TABLET BY MOUTH 3 TIMES DAILY WITH MEALS., Disp: 90 tablet, Rfl: 2 .  glucose blood (ACCU-CHEK AVIVA) test strip, Use as instructed for 1 times daily testing of blood sugar, Disp: 100 each, Rfl: 12 .  lactulose (CHRONULAC) 10 GM/15ML solution, Take 15 mLs (10 g total) by mouth daily as needed for mild constipation., Disp: 240 mL, Rfl: 0 .  meclizine (ANTIVERT) 25 MG tablet, Take 25 mg by mouth 2 (two) times daily as needed for nausea., Disp: , Rfl:  Allergies  Allergen Reactions  . Tylenol [Acetaminophen] Itching  . Tape Itching    Paper tape please.     Social History   Social History  . Marital status: Divorced    Spouse name: N/A  . Number of children: 3  . Years of education: N/A   Occupational History  . disabled    Social History Main Topics  . Smoking status: Current Every Day Smoker    Packs/day: 0.50    Years: 40.00    Types: Cigarettes  . Smokeless tobacco: Never Used  . Alcohol use No  . Drug use: No  . Sexual activity: No   Other Topics Concern  . Not on file   Social History Narrative   Has an apartment with a roommate. He was living on the streets in 02/04/13.  He reports that his father died in [place] in 2013/02/04.  He is divorced.  He is no longer estranged from his son, but still from his daughter who lives locally.  Neither of his parents, nor any siblings have any history of CAD.    Physical Exam  Pulmonary/Chest: No respiratory distress. He has no wheezes.  Abdominal: He exhibits no distension. There is no tenderness. There is no guarding.   Musculoskeletal: He exhibits no edema.  Skin: Skin is warm and dry. He is not diaphoretic.        Future Appointments Date Time Provider Department Center  08/09/2016 2:00 PM Jaclyn Shaggy, MD [facility] None  08/13/2016 4:00 PM Helane Gunther, DPM [facility]  08/22/2016 1:30 PM [facility] PHASE II ORIENT [facility] None  08/26/2016 1:15 PM MC-PHASE2 MONITOR 24 [facility] None  08/28/2016 1:15 PM MC-PH[facility]2 MONITOR 24 [facility] None  08/30/2016 1:15 PM MC-PH[facility]2 MONITOR 24 [facility] None  09/02/2016 1:15 PM MC-PHASE2 MONITOR 24 [facility] None  09/04/2016 1:15 PM MC-PHASE2 MONITOR 24 [facility] None  09/06/2016 1:15 PM MC-PHASE2 MONITOR 24 [facility] None  09/09/2016 1:15 PM MC-PHASE2 MONITOR 24 [facility] None  09/11/2016 1:15 PM MC-PHASE2 MONITOR 24 [facility] None  09/13/2016 1:15 PM  MC-PHASE2 MONITOR 24 [facility] None  09/16/2016 1:15 PM MC-PHASE2 MONITOR 24 [facility] None  09/18/2016 1:15 PM MC-PHASE2 MONITOR 24 [facility] None  09/20/2016 1:15 PM MC-PHASE2 MONITOR 24 [facility] None  09/23/2016 1:15 PM MC-PHASE2 MONITOR 24 [facility] None  09/27/2016 1:15 PM MC-PHASE2 MONITOR 24 [facility] None  09/30/2016 1:15 PM MC-PHASE2 MONITOR 24 [facility] None  10/02/2016 1:15 PM MC-PHASE2 MONITOR 24 [facility] None  10/04/2016 1:15 PM MC-PHASE2 MONITOR 24 [facility] None  10/07/2016 1:15 PM MC-PHASE2 MONITOR 24 [facility] None  10/09/2016 1:15 PM MC-PHASE2 MONITOR 24 [facility] None  10/11/2016 1:15 PM MC-PHASE2 MONITOR 24 [facility] None  10/14/2016 1:15 PM MC-PHASE2 MONITOR 24 [facility] None  10/16/2016 1:15 PM MC-PHASE2 MONITOR 24 [facility] None  10/18/2016 1:15 PM MC-PHASE2 MONITOR 24 [facility] None  10/21/2016 1:15 PM MC-PHASE2 MONITOR 24 [facility] None  10/23/2016 1:15 PM MC-PHASE2 MONITOR 24 [facility] None  10/25/2016 1:15 PM MC-PHASE2 MONITOR 24 [facility] None  10/28/2016 1:15 PM MC-PHASE2 MONITOR 24 [facility] None    ATF pt CAO x4 sitting on the bed. Pt stated that he has been busy working, that's why he's missed his [facility] appointments. Pts  case worker called yesterday because she was concerned about his health. Pt has a productive cough for the past 3 days, he also has itchy eyes and runny nose.  Pt stated that he doesn't think that the cough is due to fluid because he has issues sleeping.  Pt's right side lung sounds rhonchi with no wheezing. No edema noted to pts extremities or abdomen.    Pt's pill box hasn't been filled since 5/2, he stated that he has been taking his rx straight from the bottles.  The pill amount in the bottles is inconsistent of him taking his medciations regualy, pt then admitted that he didn't take his medications as he was supposed to.  Pt will start fasting tomorrow for ramadan for 30 days.    BP 102/60 (BP Location: Right Arm, Patient Position: Sitting, Cuff Size: Normal)   Pulse 88   Resp 16   Wt 234 lb (106.1 kg)   SpO2 98%   BMI 38.94 kg/m  cbg 102   **rx called in Ferrous sulfate (called in last visit) Isosorbide Hydralazine  Weight yesterday-231 Last visit weight-241   Shavana Calder, EMT Paramedic 08/07/2016    ACTION: Home visit completed Next visit planned for next week

## 2016-08-08 ENCOUNTER — Other Ambulatory Visit (HOSPITAL_COMMUNITY): Payer: Self-pay | Admitting: Adult Health

## 2016-08-08 DIAGNOSIS — I5042 Chronic combined systolic (congestive) and diastolic (congestive) heart failure: Secondary | ICD-10-CM

## 2016-08-08 MED FILL — TORSEMIDE 20 MG TABLET: 20 | 30 days supply | Qty: 240 | Fill #1

## 2016-08-08 MED FILL — FERROUS SULFATE 325 MG TAB: 325 (65 FE) | 30 days supply | Qty: 90 | Fill #1 | Status: TO

## 2016-08-08 MED FILL — glipiZIDE 10 MG TABS: 10 | 30 days supply | Qty: 60 | Fill #0

## 2016-08-09 ENCOUNTER — Telehealth: Payer: Self-pay

## 2016-08-09 ENCOUNTER — Ambulatory Visit (HOSPITAL_COMMUNITY): Payer: Medicare Other

## 2016-08-09 ENCOUNTER — Other Ambulatory Visit (HOSPITAL_COMMUNITY): Payer: Self-pay | Admitting: *Deleted

## 2016-08-09 ENCOUNTER — Encounter: Payer: Self-pay | Admitting: Family Medicine

## 2016-08-09 ENCOUNTER — Ambulatory Visit: Payer: Medicare Other | Attending: Family Medicine | Admitting: Family Medicine

## 2016-08-09 VITALS — BP 117/70 | HR 93 | Temp 98.0°F | Resp 18 | Ht 65.0 in | Wt 233.0 lb

## 2016-08-09 DIAGNOSIS — I5042 Chronic combined systolic (congestive) and diastolic (congestive) heart failure: Secondary | ICD-10-CM | POA: Diagnosis not present

## 2016-08-09 DIAGNOSIS — R05 Cough: Secondary | ICD-10-CM | POA: Insufficient documentation

## 2016-08-09 DIAGNOSIS — Z888 Allergy status to other drugs, medicaments and biological substances status: Secondary | ICD-10-CM | POA: Diagnosis not present

## 2016-08-09 DIAGNOSIS — I779 Disorder of arteries and arterioles, unspecified: Secondary | ICD-10-CM

## 2016-08-09 DIAGNOSIS — I252 Old myocardial infarction: Secondary | ICD-10-CM | POA: Insufficient documentation

## 2016-08-09 DIAGNOSIS — K5909 Other constipation: Secondary | ICD-10-CM | POA: Insufficient documentation

## 2016-08-09 DIAGNOSIS — I2510 Atherosclerotic heart disease of native coronary artery without angina pectoris: Secondary | ICD-10-CM | POA: Insufficient documentation

## 2016-08-09 DIAGNOSIS — R059 Cough, unspecified: Secondary | ICD-10-CM

## 2016-08-09 DIAGNOSIS — Z95810 Presence of automatic (implantable) cardiac defibrillator: Secondary | ICD-10-CM | POA: Diagnosis not present

## 2016-08-09 DIAGNOSIS — Z79899 Other long term (current) drug therapy: Secondary | ICD-10-CM | POA: Insufficient documentation

## 2016-08-09 DIAGNOSIS — Z7901 Long term (current) use of anticoagulants: Secondary | ICD-10-CM | POA: Diagnosis not present

## 2016-08-09 DIAGNOSIS — Z794 Long term (current) use of insulin: Secondary | ICD-10-CM | POA: Insufficient documentation

## 2016-08-09 DIAGNOSIS — Z955 Presence of coronary angioplasty implant and graft: Secondary | ICD-10-CM | POA: Diagnosis not present

## 2016-08-09 DIAGNOSIS — E7800 Pure hypercholesterolemia, unspecified: Secondary | ICD-10-CM | POA: Diagnosis not present

## 2016-08-09 DIAGNOSIS — I482 Chronic atrial fibrillation: Secondary | ICD-10-CM | POA: Insufficient documentation

## 2016-08-09 DIAGNOSIS — Z951 Presence of aortocoronary bypass graft: Secondary | ICD-10-CM | POA: Diagnosis not present

## 2016-08-09 DIAGNOSIS — E1151 Type 2 diabetes mellitus with diabetic peripheral angiopathy without gangrene: Secondary | ICD-10-CM | POA: Diagnosis not present

## 2016-08-09 DIAGNOSIS — I110 Hypertensive heart disease with heart failure: Secondary | ICD-10-CM | POA: Insufficient documentation

## 2016-08-09 LAB — GLUCOSE, POCT (MANUAL RESULT ENTRY): POC Glucose: 284 mg/dl — AB (ref 70–99)

## 2016-08-09 MED ORDER — HYDRALAZINE HCL 25 MG PO TABS: 25.0000 mg | ORAL_TABLET | Freq: Three times a day (TID) | ORAL | 3 refills | Status: DC

## 2016-08-09 MED ORDER — CETIRIZINE HCL 10 MG PO TABS: 10.0000 mg | ORAL_TABLET | Freq: Every day | ORAL | 1 refills | Status: DC

## 2016-08-09 MED ORDER — ISOSORBIDE MONONITRATE ER 30 MG PO TB24: 30.0000 mg | ORAL_TABLET | Freq: Every day | ORAL | 3 refills | Status: DC

## 2016-08-09 MED ORDER — LACTULOSE 10 GM/15ML PO SOLN: 10.0000 g | Freq: Two times a day (BID) | ORAL | 1 refills | Status: DC

## 2016-08-09 MED FILL — ?CETIRIZINE HCL 10 MG TABLE: 10 | 30 days supply | Qty: 30 | Fill #0 | Status: TO

## 2016-08-09 MED FILL — LACTULOSE 10 GM/15 ML SOLN: 10 | 30 days supply | Qty: 946 | Fill #0 | Status: TO

## 2016-08-09 NOTE — Telephone Encounter (Signed)
Met with the patient when he was in the clinic today to see Dr Jarold Song. He said that [facility] from paramedicine continues to see him every week to set up his medications and to check his BP, weight and blood sugar.  He noted that the PCS continue but he is not pleased with the agency. Informed him that he can contact [employer] to request a change in agency.  He said that he has the phone # for [facility] at home and will call them

## 2016-08-09 NOTE — Patient Instructions (Signed)

## 2016-08-09 NOTE — Progress Notes (Signed)
Subjective:  Patient ID: Jacob [name], male    DOB: 04-Sep-1955  Age: 61 y.o. 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  He complains of his abdomen being distended and the fact that he is not urinating as frequently as he would like to. It is 2 p.m. right now and he states he hasn't urinated all day. On further questioning he endorses constipation and this happens to be the month of Ramadan so he is fasting and has not been eating or drinking.  He also complains of a sense of mucus in his chest and rattling when he tries to cough. Denies fever, sinus congestion or sinus pain. Review of his weight indicates he has lost 8.4 pounds since his last visit 6 weeks ago. He denies shortness of breath, pedal edema.  Past Medical History:  Diagnosis Date  . Atrial fibrillation (HCC)    RVR 10/2014  . Automatic implantable cardioverter-defibrillator in situ   . CAD (coronary artery disease) Sept 2013   s/p cardiac cath showing occlusion of small RCA with collaterals  . CHF (congestive heart failure) (HCC)    20 to 25 % EF and RV dysfunction by 07/2014 echo   . Chronic anticoagulation    on xarelto.   . High cholesterol   . Hypertension   . Myocardial infarction (HCC) 2014  . Noncompliance    homelessness contributing.   . Peripheral arterial disease (HCC)   . Type II diabetes mellitus (HCC)     Past Surgical History:  Procedure Laterality Date  . CARDIAC CATHETERIZATION  09/2012  . CORONARY ANGIOPLASTY WITH STENT PLACEMENT  11/2011   "1"  . CORONARY ARTERY BYPASS GRAFT  09/2012   2 vessels per patient [name]   . ESOPHAGOGASTRODUODENOSCOPY N/A 11/28/2014   Procedure: ESOPHAGOGASTRODUODENOSCOPY (EGD);  Surgeon: Beverley Fiedler, MD;  Location: [facility] ENDOSCOPY;  Service: Endoscopy;  Laterality: N/A;  . ILIAC ARTERY STENT Right 08/30/2013  . IMPLANTABLE CARDIOVERTER DEFIBRILLATOR IMPLANT     [place] in 07/2012; [employer]  . LEFT AND RIGHT HEART CATHETERIZATION WITH CORONARY ANGIOGRAM N/A 12/02/2011   Procedure: LEFT AND RIGHT HEART CATHETERIZATION WITH CORONARY ANGIOGRAM;  Surgeon: Kathleene Hazel, MD;  Location: [facility] CATH LAB;  Service: Cardiovascular;  Laterality: N/A;  . LOWER EXTREMITY ANGIOGRAM N/A 08/30/2013   Procedure: LOWER EXTREMITY ANGIOGRAM;  Surgeon: Runell Gess, MD;  Location: [facility] CATH LAB;  Service: Cardiovascular;  Laterality: N/A;  . LOWER EXTREMITY ANGIOGRAM N/A 12/02/2013   Procedure: LOWER EXTREMITY ANGIOGRAM;  Surgeon: Runell Gess, MD;  Location: [facility] CATH LAB;  Service: Cardiovascular;  Laterality: N/A;    Allergies  Allergen Reactions  . Tylenol [Acetaminophen] Itching  . Tape Itching    Paper tape please.     Outpatient Medications Prior to Visit  Medication Sig Dispense Refill  . ACCU-CHEK SOFTCLIX LANCETS lancets Use for once daily testing of blood sugar 100 each 12  . albuterol (PROVENTIL HFA;VENTOLIN HFA) 108 (90 Base) MCG/ACT inhaler Inhale 2 puffs into the lungs every 6 (six) hours as needed for wheezing or shortness of breath. 1 Inhaler 2  . amiodarone (PACERONE) 200 MG tablet Take 1 tablet (200 mg total) by mouth daily. 30 tablet  3  . atorvastatin (LIPITOR) 40 MG tablet Take 1 tablet (40 mg total) by mouth daily at 6 PM. 30 tablet 5  . Blood Glucose Monitoring Suppl (ACCU-CHEK AVIVA) device Use as instructed1 times daily before meals 1 each 0  . digoxin (LANOXIN) 0.125 MG tablet Take 0.5 tablets (0.0625 mg total) by mouth daily. 15 tablet 3  . ferrous sulfate 325 (65 FE) MG tablet TAKE 1 TABLET BY MOUTH 3 TIMES DAILY WITH MEALS. 90 tablet 2  . gabapentin (NEURONTIN) 300 MG capsule Take 1 capsule (300 mg  total) by mouth 2 (two) times daily. 60 capsule 5  . glipiZIDE (GLUCOTROL) 10 MG tablet TAKE 1 TABLET BY MOUTH 2 TIMES DAILY BEFORE A MEAL. 60 tablet 5  . glucose blood (ACCU-CHEK AVIVA) test strip Use as instructed for 1 times daily testing of blood sugar 100 each 12  . hydrALAZINE (APRESOLINE) 25 MG tablet Take 1 tablet (25 mg total) by mouth 3 (three) times daily. 90 tablet 3  . Insulin Glargine (LANTUS) 100 UNIT/ML Solostar Pen Inject 15 Units into the skin daily at 10 pm. 2 pen 5  . Insulin Pen Needle 31G X 5 MM MISC Use as directed for once daily insulin injection 100 each 2  . isosorbide mononitrate (IMDUR) 30 MG 24 hr tablet Take 1 tablet (30 mg total) by mouth daily. 30 tablet 3  . Lancet Devices (ACCU-CHEK SOFTCLIX) lancets Use as instructed for once daily testing of blood sugar 1 each 0  . meclizine (ANTIVERT) 25 MG tablet Take 25 mg by mouth 2 (two) times daily as needed for nausea.    . rivaroxaban (XARELTO) 20 MG TABS tablet Take 1 tablet (20 mg total) by mouth daily before supper. 30 tablet 3  . spironolactone (ALDACTONE) 25 MG tablet Take 25 mg by mouth daily.    Marland Kitchen torsemide (DEMADEX) 20 MG tablet Take 4 tablets (80 mg total) by mouth 2 (two) times daily. 240 tablet 3  . lactulose (CHRONULAC) 10 GM/15ML solution Take 15 mLs (10 g total) by mouth daily as needed for mild constipation. 240 mL 0  . acetaminophen-codeine (TYLENOL #3) 300-30 MG tablet Take 1 tablet by mouth every 12 (twelve) hours as needed for moderate pain. (Patient not taking: Reported on 07/22/2016) 60 tablet 0  . albuterol (PROVENTIL) (2.5 MG/3ML) 0.083% nebulizer solution Take 3 mLs (2.5 mg total) by nebulization every 6 (six) hours as needed for wheezing or shortness of breath. (Patient not taking: Reported on 07/22/2016) 150 mL 1   No facility-administered medications prior to visit.     ROS Review of Systems Constitutional: Negative for activity change and appetite change.  HENT: Negative for sinus pressure  and sore throat.   Eyes: Negative for visual disturbance.  Respiratory: Positive for cough, negative for chest tightness and shortness of breath.   Cardiovascular: Negative for chest pain and leg swelling.  Gastrointestinal: Positive for constipation and abdominal distention, negative for abdominal pain, and diarrhea.  Endocrine: Negative.   Genitourinary: Negative for dysuria.  Musculoskeletal: Chronic back pain.  Skin: Negative for rash.  Allergic/Immunologic: Negative.   Neurological: Negative for weakness, light-headedness and numbness.  Psychiatric/Behavioral: Negative for dysphoric mood and suicidal ideas.  Objective:  BP 117/70 (BP Location: Left Arm, Patient Position: Sitting, Cuff Size: Large)   Pulse 93   Temp 98 F (36.7 C) (Oral)   Resp 18   Ht 5\' 5"  (1.651 m)   Wt 233 lb (105.7 kg)   SpO2 96%   BMI  38.77 kg/m   BP/Weight 08/09/2016 08/07/2016 07/24/2016  Systolic BP 117 102 122  Diastolic BP 70 60 70  Wt. (Lbs) 233 234 241  BMI 38.77 38.94 40.1    Wt Readings from Last 3 Encounters:  08/09/16 233 lb (105.7 kg)  08/07/16 234 lb (106.1 kg)  07/24/16 241 lb (109.3 kg)    Physical Exam Constitutional: He is oriented to person, place, and time. He appears well-developed and well-nourished. HEENT: Mild oropharyngeal erythema, normal TM, no sinus tenderness  Neck: No JVD present.  Cardiovascular: Normal rate, irregularly irregular heart rate normal heart sounds and intact distal pulses.   No murmur heard. Pulmonary/Chest: Effort normal and breath sounds normal. He has no wheezes. He has no rales. He exhibits no tenderness.  Abdominal: Soft. Bowel sounds are normal. Slight abdominal distention. There is no tenderness.  Musculoskeletal: Slight edema and tenderness of  right MCP joint of the pump No tenderness to palpation of shoulder joint. Decreased range of motion in right shoulder-abduction restricted to 120; left shoulder is normal.  Neurological: He is alert and  oriented to person, place, and time.  Skin: Skin is warm and dry   Lab Results  Component Value Date   HGBA1C 7.9 06/06/2016    Assessment & Plan:   1. DM (diabetes mellitus), type 2 with peripheral vascular complications (HCC) A1c of 7.9 Not at goal due to partial compliance - POCT UA - Microalbumin - Glucose (CBG)  2. Other constipation Increase fiber intake Reduced bowel movement could be secondary to reduced oral intake - lactulose (CHRONULAC) 10 GM/15ML solution; Take 15 mLs (10 g total) by mouth 2 (two) times daily.  Dispense: 946 mL; Refill: 1  3. Cough - cetirizine (ZYRTEC) 10 MG tablet; Take 1 tablet (10 mg total) by mouth daily.  Dispense: 30 tablet; Refill: 1   Meds ordered this encounter  Medications  . lactulose (CHRONULAC) 10 GM/15ML solution    Sig: Take 15 mLs (10 g total) by mouth 2 (two) times daily.    Dispense:  946 mL    Refill:  1  . cetirizine (ZYRTEC) 10 MG tablet    Sig: Take 1 tablet (10 mg total) by mouth daily.    Dispense:  30 tablet    Refill:  1    Follow-up: Return for Follow-up of chronic medical conditions, keep previously scheduled appointment.Jaclyn Shaggy MD

## 2016-08-12 ENCOUNTER — Ambulatory Visit (HOSPITAL_COMMUNITY): Payer: Medicare Other

## 2016-08-13 ENCOUNTER — Other Ambulatory Visit (HOSPITAL_COMMUNITY): Payer: Self-pay

## 2016-08-13 ENCOUNTER — Ambulatory Visit: Payer: Medicare Other | Admitting: Podiatry

## 2016-08-13 MED FILL — DIGITEK 125 MCG TABLET: 125 | 90 days supply | Qty: 45 | Fill #0 | Status: TO

## 2016-08-13 NOTE — Progress Notes (Signed)
Paramedicine Encounter    Patient ID: Jacob [name], male    DOB: 18-Mar-1956, 61 y.o.   MRN: [id number]    Patient Care Team: [name], MD as PCP - General (Internal Medicine) Clarisa Schools, RN as Registered Nurse Jena Gauss, Gerrit Friends, MD as Consulting Physician (Gastroenterology)  Patient Active Problem List   Diagnosis Date Noted  . Congestive heart disease (HCC) 01/01/2016  . Diabetic neuropathy (HCC) 11/28/2015  . Ascites 11/09/2015  . CHF (congestive heart failure) (HCC) 11/09/2015  . Insomnia 04/18/2015  . Claudication of right lower extremity (HCC) 04/07/2015  . Cardiomyopathy, ischemic 04/07/2015  . Chronic anticoagulation 03/30/2015  . Cardiorenal syndrome with renal failure   . SOB (shortness of breath)   . Morbid obesity due to excess calories (HCC)   . Helicobacter pylori ab+ 12/12/2014  . Calf pain   . Duodenal ulcer with hemorrhage   . Hemorrhagic shock (HCC)   . Cardiogenic shock (HCC)   . Coronary artery disease involving native coronary artery of native heart without angina pectoris   . Atrial fibrillation with rapid ventricular response (HCC) 10/29/2014  . Chest pain   . Atherosclerosis of native arteries of extremity with intermittent claudication (HCC) 09/06/2014  . Atrial fibrillation with controlled ventricular response (HCC) 08/03/2014  . Bacteremia   . DM (diabetes mellitus), type 2 with peripheral vascular complications (HCC) 08/01/2014  . Anemia of chronic disease 08/01/2014  . PVD (peripheral vascular disease) (HCC) 10/29/2013  . Tobacco abuse 05/13/2013  . AF (atrial fibrillation) (HCC) 02/11/2013  . ICD - in place- BS May 2014 [facility] 02/11/2013  . Microcytic anemia 01/11/2013  . 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 Noncompliance 12/01/2011  . Essential hypertension 10/30/2006    Current Outpatient Prescriptions:  .  albuterol (PROVENTIL HFA;VENTOLIN HFA) 108 (90 Base) MCG/ACT inhaler, Inhale 2 puffs into the lungs  every 6 (six) hours as needed for wheezing or shortness of breath., Disp: 1 Inhaler, Rfl: 2 .  albuterol (PROVENTIL) (2.5 MG/3ML) 0.083% nebulizer solution, Take 3 mLs (2.5 mg total) by nebulization every 6 (six) hours as needed for wheezing or shortness of breath., Disp: 150 mL, Rfl: 1 .  amiodarone (PACERONE) 200 MG tablet, Take 1 tablet (200 mg total) by mouth daily., Disp: 30 tablet, Rfl: 3 .  atorvastatin (LIPITOR) 40 MG tablet, Take 1 tablet (40 mg total) by mouth daily at 6 PM., Disp: 30 tablet, Rfl: 5 .  Blood Glucose Monitoring Suppl (ACCU-CHEK AVIVA) device, Use as instructed1 times daily before meals, Disp: 1 each, Rfl: 0 .  cetirizine (ZYRTEC) 10 MG tablet, Take 1 tablet (10 mg total) by mouth daily., Disp: 30 tablet, Rfl: 1 .  digoxin (LANOXIN) 0.125 MG tablet, Take 0.5 tablets (0.0625 mg total) by mouth daily., Disp: 15 tablet, Rfl: 3 .  ferrous sulfate 325 (65 FE) MG tablet, TAKE 1 TABLET BY MOUTH 3 TIMES DAILY WITH MEALS., Disp: 90 tablet, Rfl: 2 .  gabapentin (NEURONTIN) 300 MG capsule, Take 1 capsule (300 mg total) by mouth 2 (two) times daily., Disp: 60 capsule, Rfl: 5 .  glipiZIDE (GLUCOTROL) 10 MG tablet, TAKE 1 TABLET BY MOUTH 2 TIMES DAILY BEFORE A MEAL., Disp: 60 tablet, Rfl: 5 .  glucose blood (ACCU-CHEK AVIVA) test strip, Use as instructed for 1 times daily testing of blood sugar, Disp: 100 each, Rfl: 12 .  hydrALAZINE (APRESOLINE) 25 MG tablet, Take 1 tablet (25 mg total) by mouth 3 (three) times daily., Disp: 90 tablet,  Rfl: 3 .  isosorbide mononitrate (IMDUR) 30 MG 24 hr tablet, Take 1 tablet (30 mg total) by mouth daily., Disp: 30 tablet, Rfl: 3 .  lactulose (CHRONULAC) 10 GM/15ML solution, Take 15 mLs (10 g total) by mouth 2 (two) times daily., Disp: 946 mL, Rfl: 1 .  Lancet Devices (ACCU-CHEK SOFTCLIX) lancets, Use as instructed for once daily testing of blood sugar, Disp: 1 each, Rfl: 0 .  rivaroxaban (XARELTO) 20 MG TABS tablet, Take 1 tablet (20 mg total) by mouth  daily before supper., Disp: 30 tablet, Rfl: 3 .  spironolactone (ALDACTONE) 25 MG tablet, Take 25 mg by mouth daily., Disp: , Rfl:  .  spironolactone (ALDACTONE) 25 MG tablet, TAKE 1 TABLET BY MOUTH DAILY., Disp: 30 tablet, Rfl: 4 .  torsemide (DEMADEX) 20 MG tablet, Take 4 tablets (80 mg total) by mouth 2 (two) times daily., Disp: 240 tablet, Rfl: 3 .  ACCU-CHEK SOFTCLIX LANCETS lancets, Use for once daily testing of blood sugar, Disp: 100 each, Rfl: 12 .  acetaminophen-codeine (TYLENOL #3) 300-30 MG tablet, Take 1 tablet by mouth every 12 (twelve) hours as needed for moderate pain. (Patient not taking: Reported on 07/22/2016), Disp: 60 tablet, Rfl: 0 .  Insulin Glargine (LANTUS) 100 UNIT/ML Solostar Pen, Inject 15 Units into the skin daily at 10 pm., Disp: 2 pen, Rfl: 5 .  Insulin Pen Needle 31G X 5 MM MISC, Use as directed for once daily insulin injection, Disp: 100 each, Rfl: 2 .  meclizine (ANTIVERT) 25 MG tablet, Take 25 mg by mouth 2 (two) times daily as needed for nausea., Disp: , Rfl:  Allergies  Allergen Reactions  . Tylenol [Acetaminophen] Itching  . Tape Itching    Paper tape please.     Social History   Social History  . Marital status: Divorced    Spouse name: N/A  . Number of children: 3  . Years of education: N/A   Occupational History  . disabled    Social History Main Topics  . Smoking status: Current Every Day Smoker    Packs/day: 0.50    Years: 40.00    Types: Cigarettes  . Smokeless tobacco: Never Used  . Alcohol use No  . Drug use: No  . Sexual activity: No   Other Topics Concern  . Not on file   Social History Narrative   Has an apartment with a roommate. He was living on the streets in February 06, 2013.  He reports that his father died in [place] in 02/06/2013.  He is divorced.  He is no longer estranged from his son, but still from his daughter who lives locally.  Neither of his parents, nor any siblings have any history of CAD.    Physical Exam   Eyes: Pupils are equal, round, and reactive to light.  Pulmonary/Chest: No respiratory distress. He has no wheezes. He has no rales.  Abdominal: He exhibits no distension. There is no tenderness. There is no guarding.  Musculoskeletal: He exhibits no edema.  Skin: Skin is warm and dry. He is not diaphoretic.        Future Appointments Date Time Provider Department Center  08/13/2016 4:00 PM Helane Gunther, DPM [facility]  08/22/2016 1:30 PM [facility] PH[facility] II ORIENT [facility] None  08/26/2016 1:15 PM [facility]-PHASE2 MONITOR 24 [facility] None  08/28/2016 1:15 PM [facility]-PHASE2 MONITOR 24 [facility] None  08/30/2016 1:15 PM [facility]-PHASE2 MONITOR 24 [facility] None  09/02/2016 1:15 PM [facility]-PHASE2 MONITOR 24 [facility] None  09/04/2016 1:15 PM  [facility]-PHASE2 MONITOR 24 [facility] None  09/06/2016 1:15 PM [facility]-PHASE2 MONITOR 24 [facility] None  09/09/2016 1:15 PM [facility]-PHASE2 MONITOR 24 [facility] None  09/11/2016 1:15 PM MC-PHASE2 MONITOR 24 [facility] None  09/13/2016 1:15 PM [facility]-PHASE2 MONITOR 24 [facility] None  09/16/2016 1:15 PM MC-PHASE2 MONITOR 24 [facility] None  09/18/2016 1:15 PM MC-PHASE2 MONITOR 24 [facility] None  09/20/2016 1:15 PM MC-PHASE2 MONITOR 24 [facility] None  09/23/2016 1:15 PM MC-PHASE2 MONITOR 24 [facility] None  09/27/2016 1:15 PM MC-PHASE2 MONITOR 24 [facility] None  09/30/2016 1:15 PM MC-PHASE2 MONITOR 24 [facility] None  10/02/2016 1:15 PM MC-PHASE2 MONITOR 24 [facility] None  10/04/2016 1:15 PM MC-PHASE2 MONITOR 24 [facility] None  10/07/2016 1:15 PM MC-PHASE2 MONITOR 24 [facility] None  10/09/2016 1:15 PM MC-PHASE2 MONITOR 24 [facility] None  10/11/2016 1:15 PM MC-PHASE2 MONITOR 24 [facility] None  10/14/2016 1:15 PM MC-PHASE2 MONITOR 24 [facility] None  10/16/2016 1:15 PM MC-PHASE2 MONITOR 24 [facility] None  10/18/2016 1:15 PM MC-PHASE2 MONITOR 24 [facility] None  10/21/2016 1:15 PM MC-PHASE2 MONITOR 24 [facility] None  10/23/2016 1:15 PM MC-PHASE2 MONITOR 24 [facility] None  10/25/2016 1:15 PM MC-PHASE2 MONITOR 24 [facility] None  10/28/2016 1:15  PM MC-PHASE2 MONITOR 24 [facility] None    ATF pt CAO x4 just waking up for the day. Pt was upset because [facility] his prescription for Lantus. I called the pharmacy and they stated that his insurance wouldn't approve the refill until 5/23.  Pt stated that he has been out of lantus for 5 days.  Pt still has a cough, his primary prescribed pt zyrtec for allergies.  Pt has no sob, dizziness, headache and chest pain.  rx bottles verified and pill box refilled.    BP 120/60 (BP Location: Right Arm, Patient Position: Sitting, Cuff Size: Normal)   Pulse 92   Resp 16   Wt 231 lb 9.6 oz (105.1 kg)   SpO2 98%   BMI 38.54 kg/m  cbg 166 ** called in: Digoxin lantus hydalazine  Weight yesterday-231.6 Last visit weight-234   Tanush Drees, EMT Paramedic 08/13/2016   ACTION: Home visit completed Next visit planned for next tues

## 2016-08-14 ENCOUNTER — Ambulatory Visit (HOSPITAL_COMMUNITY): Payer: Medicare Other

## 2016-08-14 MED FILL — LANTUS SOLOSTAR 100 UNITS/M: 100 | 40 days supply | Qty: 6 | Fill #0 | Status: TO

## 2016-08-16 ENCOUNTER — Ambulatory Visit (HOSPITAL_COMMUNITY): Payer: Medicare Other

## 2016-08-21 ENCOUNTER — Other Ambulatory Visit (HOSPITAL_COMMUNITY): Payer: Self-pay

## 2016-08-21 ENCOUNTER — Ambulatory Visit (HOSPITAL_COMMUNITY): Payer: Medicare Other

## 2016-08-21 ENCOUNTER — Other Ambulatory Visit (HOSPITAL_COMMUNITY): Payer: Self-pay | Admitting: Pharmacist

## 2016-08-21 MED ORDER — HYDRALAZINE HCL 25 MG PO TABS: 25.0000 mg | ORAL_TABLET | Freq: Three times a day (TID) | ORAL | 3 refills | Status: DC

## 2016-08-21 MED FILL — XARELTO 20 MG TABLET: 20 | 30 days supply | Qty: 30 | Fill #1

## 2016-08-21 MED FILL — hydrALAZINE HCL 25 MG TABS: 25 | 30 days supply | Qty: 90 | Fill #0

## 2016-08-21 NOTE — Progress Notes (Signed)
Paramedicine Encounter    Patient ID: Jacob [name], male    DOB: 1955-05-10, 61 y.o.   MRN: [id number]    Patient Care Team: [name], MD as PCP - General (Internal Medicine) Clarisa Schools, RN as Registered Nurse Jena Gauss, Gerrit Friends, MD as Consulting Physician (Gastroenterology)  Patient Active Problem List   Diagnosis Date Noted  . Congestive heart disease (HCC) 01/01/2016  . Diabetic neuropathy (HCC) 11/28/2015  . Ascites 11/09/2015  . CHF (congestive heart failure) (HCC) 11/09/2015  . Insomnia 04/18/2015  . Claudication of right lower extremity (HCC) 04/07/2015  . Cardiomyopathy, ischemic 04/07/2015  . Chronic anticoagulation 03/30/2015  . Cardiorenal syndrome with renal failure   . SOB (shortness of breath)   . Morbid obesity due to excess calories (HCC)   . Helicobacter pylori ab+ 12/12/2014  . Calf pain   . Duodenal ulcer with hemorrhage   . Hemorrhagic shock (HCC)   . Cardiogenic shock (HCC)   . Coronary artery disease involving native coronary artery of native heart without angina pectoris   . Atrial fibrillation with rapid ventricular response (HCC) 10/29/2014  . Chest pain   . Atherosclerosis of native arteries of extremity with intermittent claudication (HCC) 09/06/2014  . Atrial fibrillation with controlled ventricular response (HCC) 08/03/2014  . Bacteremia   . DM (diabetes mellitus), type 2 with peripheral vascular complications (HCC) 08/01/2014  . Anemia of chronic disease 08/01/2014  . PVD (peripheral vascular disease) (HCC) 10/29/2013  . Tobacco abuse 05/13/2013  . AF (atrial fibrillation) (HCC) 02/11/2013  . ICD - in place- BS May 2014 [facility] 02/11/2013  . Microcytic anemia 01/11/2013  . 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 Noncompliance 12/01/2011  . Essential hypertension 10/30/2006    Current Outpatient Prescriptions:  .  ACCU-CHEK SOFTCLIX LANCETS lancets, Use for once daily testing of blood sugar, Disp: 100 each, Rfl:  12 .  albuterol (PROVENTIL HFA;VENTOLIN HFA) 108 (90 Base) MCG/ACT inhaler, Inhale 2 puffs into the lungs every 6 (six) hours as needed for wheezing or shortness of breath., Disp: 1 Inhaler, Rfl: 2 .  amiodarone (PACERONE) 200 MG tablet, Take 1 tablet (200 mg total) by mouth daily., Disp: 30 tablet, Rfl: 3 .  atorvastatin (LIPITOR) 40 MG tablet, Take 1 tablet (40 mg total) by mouth daily at 6 PM., Disp: 30 tablet, Rfl: 5 .  cetirizine (ZYRTEC) 10 MG tablet, Take 1 tablet (10 mg total) by mouth daily., Disp: 30 tablet, Rfl: 1 .  digoxin (LANOXIN) 0.125 MG tablet, Take 0.5 tablets (0.0625 mg total) by mouth daily., Disp: 15 tablet, Rfl: 3 .  ferrous sulfate 325 (65 FE) MG tablet, TAKE 1 TABLET BY MOUTH 3 TIMES DAILY WITH MEALS., Disp: 90 tablet, Rfl: 2 .  gabapentin (NEURONTIN) 300 MG capsule, Take 1 capsule (300 mg total) by mouth 2 (two) times daily., Disp: 60 capsule, Rfl: 5 .  glipiZIDE (GLUCOTROL) 10 MG tablet, TAKE 1 TABLET BY MOUTH 2 TIMES DAILY BEFORE A MEAL., Disp: 60 tablet, Rfl: 5 .  Insulin Glargine (LANTUS) 100 UNIT/ML Solostar Pen, Inject 15 Units into the skin daily at 10 pm., Disp: 2 pen, Rfl: 5 .  Insulin Pen Needle 31G X 5 MM MISC, Use as directed for once daily insulin injection, Disp: 100 each, Rfl: 2 .  isosorbide mononitrate (IMDUR) 30 MG 24 hr tablet, Take 1 tablet (30 mg total) by mouth daily., Disp: 30 tablet, Rfl: 3 .  rivaroxaban (XARELTO) 20 MG TABS tablet, Take 1 tablet (  20 mg total) by mouth daily before supper., Disp: 30 tablet, Rfl: 3 .  torsemide (DEMADEX) 20 MG tablet, Take 4 tablets (80 mg total) by mouth 2 (two) times daily., Disp: 240 tablet, Rfl: 3 .  acetaminophen-codeine (TYLENOL #3) 300-30 MG tablet, Take 1 tablet by mouth every 12 (twelve) hours as needed for moderate pain. (Patient not taking: Reported on 07/22/2016), Disp: 60 tablet, Rfl: 0 .  albuterol (PROVENTIL) (2.5 MG/3ML) 0.083% nebulizer solution, Take 3 mLs (2.5 mg total) by nebulization every 6 (six)  hours as needed for wheezing or shortness of breath., Disp: 150 mL, Rfl: 1 .  Blood Glucose Monitoring Suppl (ACCU-CHEK AVIVA) device, Use as instructed1 times daily before meals, Disp: 1 each, Rfl: 0 .  glucose blood (ACCU-CHEK AVIVA) test strip, Use as instructed for 1 times daily testing of blood sugar, Disp: 100 each, Rfl: 12 .  hydrALAZINE (APRESOLINE) 25 MG tablet, Take 1 tablet (25 mg total) by mouth 3 (three) times daily., Disp: 90 tablet, Rfl: 3 .  lactulose (CHRONULAC) 10 GM/15ML solution, Take 15 mLs (10 g total) by mouth 2 (two) times daily., Disp: 946 mL, Rfl: 1 .  Lancet Devices (ACCU-CHEK SOFTCLIX) lancets, Use as instructed for once daily testing of blood sugar, Disp: 1 each, Rfl: 0 .  meclizine (ANTIVERT) 25 MG tablet, Take 25 mg by mouth 2 (two) times daily as needed for nausea., Disp: , Rfl:  .  spironolactone (ALDACTONE) 25 MG tablet, Take 25 mg by mouth daily., Disp: , Rfl:  Allergies  Allergen Reactions  . Tylenol [Acetaminophen] Itching  . Tape Itching    Paper tape please.     Social History   Social History  . Marital status: Divorced    Spouse name: N/A  . Number of children: 3  . Years of education: N/A   Occupational History  . disabled    Social History Main Topics  . Smoking status: Current Every Day Smoker    Packs/day: 0.50    Years: 40.00    Types: Cigarettes  . Smokeless tobacco: Never Used  . Alcohol use No  . Drug use: No  . Sexual activity: No   Other Topics Concern  . Not on file   Social History Narrative   Has an apartment with a roommate. He was living on the streets in January 23, 2013.  He reports that his father died in [place] in 23-Jan-2013.  He is divorced.  He is no longer estranged from his son, but still from his daughter who lives locally.  Neither of his parents, nor any siblings have any history of CAD.    Physical Exam  Eyes: Pupils are equal, round, and reactive to light.  Pulmonary/Chest: No respiratory distress. He has  no wheezes. He has no rales.  Abdominal: He exhibits no distension. There is no tenderness. There is no guarding.  Musculoskeletal: He exhibits no edema.  Skin: Skin is warm and dry. He is not diaphoretic.        Future Appointments Date Time Provider Department Center  08/22/2016 1:30 PM [facility] PHASE II ORIENT [facility] None  08/26/2016 1:15 PM MC-PHASE2 MONITOR 24 [facility] None  08/28/2016 1:15 PM MC-PHASE2 MONITOR 24 [facility] None  08/30/2016 1:15 PM MC-PHASE2 MONITOR 24 [facility] None  09/02/2016 1:15 PM MC-PHASE2 MONITOR 24 [facility] None  09/04/2016 1:15 PM MC-PHASE2 MONITOR 24 [facility] None  09/06/2016 1:15 PM MC-PHASE2 MONITOR 24 [facility] None  09/09/2016 1:15 PM MC-PHASE2 MONITOR 24 [facility] None  09/11/2016 1:15 PM  MC-PHASE2 MONITOR 24 [facility] None  09/13/2016 1:15 PM MC-PHASE2 MONITOR 24 [facility] None  09/16/2016 1:15 PM MC-PHASE2 MONITOR 24 [facility] None  09/18/2016 1:15 PM MC-PHASE2 MONITOR 24 [facility] None  09/20/2016 1:15 PM MC-PHASE2 MONITOR 24 [facility] None  09/23/2016 1:15 PM MC-PHASE2 MONITOR 24 [facility] None  09/27/2016 1:15 PM MC-PHASE2 MONITOR 24 [facility] None  09/30/2016 1:15 PM MC-PHASE2 MONITOR 24 [facility] None  10/02/2016 1:15 PM MC-PHASE2 MONITOR 24 [facility] None  10/04/2016 1:15 PM MC-PHASE2 MONITOR 24 [facility] None  10/07/2016 1:15 PM MC-PHASE2 MONITOR 24 [facility] None  10/09/2016 1:15 PM MC-PHASE2 MONITOR 24 [facility] None  10/11/2016 1:15 PM MC-PHASE2 MONITOR 24 [facility] None  10/14/2016 1:15 PM MC-PHASE2 MONITOR 24 [facility] None  10/16/2016 1:15 PM MC-PHASE2 MONITOR 24 [facility] None  10/18/2016 1:15 PM MC-PHASE2 MONITOR 24 [facility] None  10/21/2016 1:15 PM MC-PHASE2 MONITOR 24 [facility] None  10/23/2016 1:15 PM MC-PHASE2 MONITOR 24 [facility] None  10/25/2016 1:15 PM MC-PHASE2 MONITOR 24 [facility] None  10/28/2016 1:15 PM MC-PHASE2 MONITOR 24 [facility] None    ATF pt CAO x4 c/o feeling tired, he didn't get much sleep last night.  He stated that he keeps his tv on during the night and  he wakes up to watching the tv late night, therefore hes tired the next morning. He hasnt taken his am medications or insulin, because he hasn't eaten yet. After I checked his cbg he stated that he will go ahead and take his meds.  He has started back weighing himself daily. Yesterday he ate fish and rice that his aide cooked.  Arline Asp is the new aide that comes daily from 2-4 to cook and clean.    Pt has an open sore on left lower leg/no puss/bleeding control/still red. He stated that he hit it on something while walking into his apartment.  rx bottles verified and pill box refilled.  He stated that he will pick up the following medications today.   rx called in: *Digitek filled until sat (pt taks pill left to right/doesnt start on the day I come) *hydralzine filled until wed morning/no pills in wed *xarelto  BP 102/60 (BP Location: Right Arm, Patient Position: Sitting, Cuff Size: Normal)   Pulse 88   Resp 12   Wt 233 lb (105.7 kg)   SpO2 98%   BMI 38.77 kg/m  cbg 239  Weight yesterday-231 Last visit weight-213    Talita Recht, EMT Paramedic 08/21/2016    ACTION: Home visit completed

## 2016-08-22 ENCOUNTER — Other Ambulatory Visit (HOSPITAL_COMMUNITY): Payer: Self-pay

## 2016-08-22 ENCOUNTER — Inpatient Hospital Stay (HOSPITAL_COMMUNITY): Admission: RE | Admit: 2016-08-22 | Payer: Medicare Other | Source: Ambulatory Visit

## 2016-08-22 NOTE — Progress Notes (Signed)
Paramedicine Encounter    Patient ID: Jacob [name], male    DOB: [birth year], 61 y.o.   MRN: [id number]    Patient Care Team: [name], MD as PCP - General (Internal Medicine) Clarisa Schools, RN as Registered Nurse Jena Gauss, Gerrit Friends, MD as Consulting Physician (Gastroenterology)  Patient Active Problem List   Diagnosis Date Noted  . Congestive heart disease (HCC) 01/01/2016  . Diabetic neuropathy (HCC) 11/28/2015  . Ascites 11/09/2015  . CHF (congestive heart failure) (HCC) 11/09/2015  . Insomnia 04/18/2015  . Claudication of right lower extremity (HCC) 04/07/2015  . Cardiomyopathy, ischemic 04/07/2015  . Chronic anticoagulation 03/30/2015  . Cardiorenal syndrome with renal failure   . SOB (shortness of breath)   . Morbid obesity due to excess calories (HCC)   . Helicobacter pylori ab+ 12/12/2014  . Calf pain   . Duodenal ulcer with hemorrhage   . Hemorrhagic shock (HCC)   . Cardiogenic shock (HCC)   . Coronary artery disease involving native coronary artery of native heart without angina pectoris   . Atrial fibrillation with rapid ventricular response (HCC) 10/29/2014  . Chest pain   . Atherosclerosis of native arteries of extremity with intermittent claudication (HCC) 09/06/2014  . Atrial fibrillation with controlled ventricular response (HCC) 08/03/2014  . Bacteremia   . DM (diabetes mellitus), type 2 with peripheral vascular complications (HCC) 08/01/2014  . Anemia of chronic disease 08/01/2014  . PVD (peripheral vascular disease) (HCC) 10/29/2013  . Tobacco abuse 05/13/2013  . AF (atrial fibrillation) (HCC) 02/11/2013  . ICD - in place- BS May 2014 [facility] 02/11/2013  . Microcytic anemia 01/11/2013  . 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 Noncompliance 12/01/2011  . Essential hypertension 10/30/2006    Current Outpatient Prescriptions:  .  albuterol (PROVENTIL HFA;VENTOLIN HFA) 108 (90 Base) MCG/ACT inhaler, Inhale 2 puffs into the lungs  every 6 (six) hours as needed for wheezing or shortness of breath., Disp: 1 Inhaler, Rfl: 2 .  albuterol (PROVENTIL) (2.5 MG/3ML) 0.083% nebulizer solution, Take 3 mLs (2.5 mg total) by nebulization every 6 (six) hours as needed for wheezing or shortness of breath., Disp: 150 mL, Rfl: 1 .  amiodarone (PACERONE) 200 MG tablet, Take 1 tablet (200 mg total) by mouth daily., Disp: 30 tablet, Rfl: 3 .  atorvastatin (LIPITOR) 40 MG tablet, Take 1 tablet (40 mg total) by mouth daily at 6 PM., Disp: 30 tablet, Rfl: 5 .  Blood Glucose Monitoring Suppl (ACCU-CHEK AVIVA) device, Use as instructed1 times daily before meals, Disp: 1 each, Rfl: 0 .  cetirizine (ZYRTEC) 10 MG tablet, Take 1 tablet (10 mg total) by mouth daily., Disp: 30 tablet, Rfl: 1 .  digoxin (LANOXIN) 0.125 MG tablet, Take 0.5 tablets (0.0625 mg total) by mouth daily., Disp: 15 tablet, Rfl: 3 .  ferrous sulfate 325 (65 FE) MG tablet, TAKE 1 TABLET BY MOUTH 3 TIMES DAILY WITH MEALS., Disp: 90 tablet, Rfl: 2 .  gabapentin (NEURONTIN) 300 MG capsule, Take 1 capsule (300 mg total) by mouth 2 (two) times daily., Disp: 60 capsule, Rfl: 5 .  glipiZIDE (GLUCOTROL) 10 MG tablet, TAKE 1 TABLET BY MOUTH 2 TIMES DAILY BEFORE A MEAL., Disp: 60 tablet, Rfl: 5 .  hydrALAZINE (APRESOLINE) 25 MG tablet, Take 1 tablet (25 mg total) by mouth 3 (three) times daily., Disp: 90 tablet, Rfl: 3 .  Insulin Pen Needle 31G X 5 MM MISC, Use as directed for once daily insulin injection, Disp: 100 each, Rfl:  2 .  isosorbide mononitrate (IMDUR) 30 MG 24 hr tablet, Take 1 tablet (30 mg total) by mouth daily., Disp: 30 tablet, Rfl: 3 .  Lancet Devices (ACCU-CHEK SOFTCLIX) lancets, Use as instructed for once daily testing of blood sugar, Disp: 1 each, Rfl: 0 .  rivaroxaban (XARELTO) 20 MG TABS tablet, Take 1 tablet (20 mg total) by mouth daily before supper., Disp: 30 tablet, Rfl: 3 .  spironolactone (ALDACTONE) 25 MG tablet, Take 25 mg by mouth daily., Disp: , Rfl:  .   torsemide (DEMADEX) 20 MG tablet, Take 4 tablets (80 mg total) by mouth 2 (two) times daily., Disp: 240 tablet, Rfl: 3 .  ACCU-CHEK SOFTCLIX LANCETS lancets, Use for once daily testing of blood sugar, Disp: 100 each, Rfl: 12 .  acetaminophen-codeine (TYLENOL #3) 300-30 MG tablet, Take 1 tablet by mouth every 12 (twelve) hours as needed for moderate pain. (Patient not taking: Reported on 07/22/2016), Disp: 60 tablet, Rfl: 0 .  glucose blood (ACCU-CHEK AVIVA) test strip, Use as instructed for 1 times daily testing of blood sugar, Disp: 100 each, Rfl: 12 .  Insulin Glargine (LANTUS) 100 UNIT/ML Solostar Pen, Inject 15 Units into the skin daily at 10 pm., Disp: 2 pen, Rfl: 5 .  lactulose (CHRONULAC) 10 GM/15ML solution, Take 15 mLs (10 g total) by mouth 2 (two) times daily., Disp: 946 mL, Rfl: 1 .  meclizine (ANTIVERT) 25 MG tablet, Take 25 mg by mouth 2 (two) times daily as needed for nausea., Disp: , Rfl:  Allergies  Allergen Reactions  . Tylenol [Acetaminophen] Itching  . Tape Itching    Paper tape please.     Social History   Social History  . Marital status: Divorced    Spouse name: N/A  . Number of children: 3  . Years of education: N/A   Occupational History  . disabled    Social History Main Topics  . Smoking status: Current Every Day Smoker    Packs/day: 0.50    Years: 40.00    Types: Cigarettes  . Smokeless tobacco: Never Used  . Alcohol use No  . Drug use: No  . Sexual activity: No   Other Topics Concern  . Not on file   Social History Narrative   Has an apartment with a roommate. He was living on the streets in Jan 23, 2013.  He reports that his father died in [place] in January 23, 2013.  He is divorced.  He is no longer estranged from his son, but still from his daughter who lives locally.  Neither of his parents, nor any siblings have any history of CAD.    Physical Exam  Pulmonary/Chest: No respiratory distress. He has no wheezes. He has no rales.  Abdominal: He  exhibits no distension. There is no tenderness. There is no guarding.  Musculoskeletal: He exhibits no edema.  Skin: Skin is warm and dry. He is not diaphoretic.        Future Appointments Date Time Provider Department Center  08/22/2016 1:30 PM [facility] PHASE II ORIENT [facility] None  08/26/2016 1:15 PM MC-PHASE2 MONITOR 24 [facility] None  08/28/2016 1:15 PM MC-PHASE2 MONITOR 24 [facility] None  08/30/2016 1:15 PM MC-PHASE2 MONITOR 24 [facility] None  09/02/2016 1:15 PM MC-PHASE2 MONITOR 24 [facility] None  09/04/2016 1:15 PM MC-PHASE2 MONITOR 24 [facility] None  09/06/2016 1:15 PM MC-PHASE2 MONITOR 24 [facility] None  09/09/2016 1:15 PM MC-PHASE2 MONITOR 24 [facility] None  09/11/2016 1:15 PM MC-PHASE2 MONITOR 24 [facility] None  09/13/2016 1:15 PM MC-PHASE2  MONITOR 24 [facility] None  09/16/2016 1:15 PM MC-PHASE2 MONITOR 24 [facility] None  09/18/2016 1:15 PM MC-PHASE2 MONITOR 24 [facility] None  09/20/2016 1:15 PM MC-PHASE2 MONITOR 24 [facility] None  09/23/2016 1:15 PM MC-PHASE2 MONITOR 24 [facility] None  09/27/2016 1:15 PM MC-PHASE2 MONITOR 24 [facility] None  09/30/2016 1:15 PM MC-PHASE2 MONITOR 24 [facility] None  10/02/2016 1:15 PM MC-PHASE2 MONITOR 24 [facility] None  10/04/2016 1:15 PM MC-PHASE2 MONITOR 24 [facility] None  10/07/2016 1:15 PM MC-PHASE2 MONITOR 24 [facility] None  10/09/2016 1:15 PM MC-PHASE2 MONITOR 24 [facility] None  10/11/2016 1:15 PM MC-PHASE2 MONITOR 24 [facility] None  10/14/2016 1:15 PM MC-PHASE2 MONITOR 24 [facility] None  10/16/2016 1:15 PM MC-PHASE2 MONITOR 24 [facility] None  10/18/2016 1:15 PM MC-PHASE2 MONITOR 24 [facility] None  10/21/2016 1:15 PM MC-PHASE2 MONITOR 24 [facility] None  10/23/2016 1:15 PM MC-PHASE2 MONITOR 24 [facility] None  10/25/2016 1:15 PM MC-PHASE2 MONITOR 24 [facility] None  10/28/2016 1:15 PM MC-PHASE2 MONITOR 24 [facility] None    ATF pt CAO x4 sitting on the edge of his bed.  Revisit today due to pt not having all of his medications to refill his pill box.  The first and second box refilled today.   Pt will now be seen bi-weekly.  He's weighing but not recording the readings and we discussed the importance of him keeping a record.  He was shown how to begin the second pill on Wednesday.  Pt denies sob, lightheadedness, headache and chest pain.  Pt still have a productive cough.    BP 110/60 (BP Location: Right Arm, Patient Position: Sitting, Cuff Size: Normal)   Pulse 82   Resp 16   SpO2 98%    Jill Ruppe, EMT Paramedic 08/22/2016    ACTION: Home visit completed

## 2016-08-23 ENCOUNTER — Ambulatory Visit (HOSPITAL_COMMUNITY): Payer: Medicare Other

## 2016-08-26 ENCOUNTER — Ambulatory Visit (HOSPITAL_COMMUNITY): Payer: Medicare Other

## 2016-08-28 ENCOUNTER — Ambulatory Visit (HOSPITAL_COMMUNITY): Payer: Medicare Other

## 2016-08-30 ENCOUNTER — Ambulatory Visit (HOSPITAL_COMMUNITY): Payer: Medicare Other

## 2016-09-02 ENCOUNTER — Ambulatory Visit (HOSPITAL_COMMUNITY): Payer: Medicare Other

## 2016-09-03 ENCOUNTER — Other Ambulatory Visit (HOSPITAL_COMMUNITY): Payer: Self-pay | Admitting: Adult Health

## 2016-09-03 ENCOUNTER — Other Ambulatory Visit (HOSPITAL_COMMUNITY): Payer: Self-pay

## 2016-09-03 DIAGNOSIS — I5042 Chronic combined systolic (congestive) and diastolic (congestive) heart failure: Secondary | ICD-10-CM

## 2016-09-03 NOTE — Progress Notes (Signed)
Paramedicine Encounter    Patient ID: Jacob [name], male    DOB: 15-Feb-1956, 61 y.o.   MRN: [id number]    Patient Care Team: [name], MD as PCP - General (Internal Medicine) Clarisa Schools, RN as Registered Nurse Jena Gauss, Gerrit Friends, MD as Consulting Physician (Gastroenterology)  Patient Active Problem List   Diagnosis Date Noted  . Congestive heart disease (HCC) 01/01/2016  . Diabetic neuropathy (HCC) 11/28/2015  . Ascites 11/09/2015  . CHF (congestive heart failure) (HCC) 11/09/2015  . Insomnia 04/18/2015  . Claudication of right lower extremity (HCC) 04/07/2015  . Cardiomyopathy, ischemic 04/07/2015  . Chronic anticoagulation 03/30/2015  . Cardiorenal syndrome with renal failure   . SOB (shortness of breath)   . Morbid obesity due to excess calories (HCC)   . Helicobacter pylori ab+ 12/12/2014  . Calf pain   . Duodenal ulcer with hemorrhage   . Hemorrhagic shock (HCC)   . Cardiogenic shock (HCC)   . Coronary artery disease involving native coronary artery of native heart without angina pectoris   . Atrial fibrillation with rapid ventricular response (HCC) 10/29/2014  . Chest pain   . Atherosclerosis of native arteries of extremity with intermittent claudication (HCC) 09/06/2014  . Atrial fibrillation with controlled ventricular response (HCC) 08/03/2014  . Bacteremia   . DM (diabetes mellitus), type 2 with peripheral vascular complications (HCC) 08/01/2014  . Anemia of chronic disease 08/01/2014  . PVD (peripheral vascular disease) (HCC) 10/29/2013  . Tobacco abuse 05/13/2013  . AF (atrial fibrillation) (HCC) 02/11/2013  . ICD - in place- BS May 2014 [facility] 02/11/2013  . Microcytic anemia 01/11/2013  . 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 Noncompliance 12/01/2011  . Essential hypertension 10/30/2006    Current Outpatient Prescriptions:  .  amiodarone (PACERONE) 200 MG tablet, Take 1 tablet (200 mg total) by mouth daily., Disp: 30 tablet, Rfl:  3 .  atorvastatin (LIPITOR) 40 MG tablet, Take 1 tablet (40 mg total) by mouth daily at 6 PM., Disp: 30 tablet, Rfl: 5 .  Blood Glucose Monitoring Suppl (ACCU-CHEK AVIVA) device, Use as instructed1 times daily before meals, Disp: 1 each, Rfl: 0 .  cetirizine (ZYRTEC) 10 MG tablet, Take 1 tablet (10 mg total) by mouth daily., Disp: 30 tablet, Rfl: 1 .  digoxin (LANOXIN) 0.125 MG tablet, Take 0.5 tablets (0.0625 mg total) by mouth daily., Disp: 15 tablet, Rfl: 3 .  ferrous sulfate 325 (65 FE) MG tablet, TAKE 1 TABLET BY MOUTH 3 TIMES DAILY WITH MEALS., Disp: 90 tablet, Rfl: 2 .  gabapentin (NEURONTIN) 300 MG capsule, Take 1 capsule (300 mg total) by mouth 2 (two) times daily., Disp: 60 capsule, Rfl: 5 .  glipiZIDE (GLUCOTROL) 10 MG tablet, TAKE 1 TABLET BY MOUTH 2 TIMES DAILY BEFORE A MEAL., Disp: 60 tablet, Rfl: 5 .  glucose blood (ACCU-CHEK AVIVA) test strip, Use as instructed for 1 times daily testing of blood sugar, Disp: 100 each, Rfl: 12 .  hydrALAZINE (APRESOLINE) 25 MG tablet, Take 1 tablet (25 mg total) by mouth 3 (three) times daily., Disp: 90 tablet, Rfl: 3 .  Insulin Glargine (LANTUS) 100 UNIT/ML Solostar Pen, Inject 15 Units into the skin daily at 10 pm., Disp: 2 pen, Rfl: 5 .  Insulin Pen Needle 31G X 5 MM MISC, Use as directed for once daily insulin injection, Disp: 100 each, Rfl: 2 .  Lancet Devices (ACCU-CHEK SOFTCLIX) lancets, Use as instructed for once daily testing of blood sugar, Disp: 1  each, Rfl: 0 .  meclizine (ANTIVERT) 25 MG tablet, Take 25 mg by mouth 2 (two) times daily as needed for nausea., Disp: , Rfl:  .  rivaroxaban (XARELTO) 20 MG TABS tablet, Take 1 tablet (20 mg total) by mouth daily before supper., Disp: 30 tablet, Rfl: 3 .  spironolactone (ALDACTONE) 25 MG tablet, Take 25 mg by mouth daily., Disp: , Rfl:  .  torsemide (DEMADEX) 20 MG tablet, Take 4 tablets (80 mg total) by mouth 2 (two) times daily., Disp: 240 tablet, Rfl: 3 .  ACCU-CHEK SOFTCLIX LANCETS lancets,  Use for once daily testing of blood sugar, Disp: 100 each, Rfl: 12 .  acetaminophen-codeine (TYLENOL #3) 300-30 MG tablet, Take 1 tablet by mouth every 12 (twelve) hours as needed for moderate pain. (Patient not taking: Reported on 07/22/2016), Disp: 60 tablet, Rfl: 0 .  albuterol (PROVENTIL HFA;VENTOLIN HFA) 108 (90 Base) MCG/ACT inhaler, Inhale 2 puffs into the lungs every 6 (six) hours as needed for wheezing or shortness of breath., Disp: 1 Inhaler, Rfl: 2 .  albuterol (PROVENTIL) (2.5 MG/3ML) 0.083% nebulizer solution, Take 3 mLs (2.5 mg total) by nebulization every 6 (six) hours as needed for wheezing or shortness of breath., Disp: 150 mL, Rfl: 1 .  isosorbide mononitrate (IMDUR) 30 MG 24 hr tablet, Take 1 tablet (30 mg total) by mouth daily., Disp: 30 tablet, Rfl: 3 .  lactulose (CHRONULAC) 10 GM/15ML solution, Take 15 mLs (10 g total) by mouth 2 (two) times daily., Disp: 946 mL, Rfl: 1 Allergies  Allergen Reactions  . Tylenol [Acetaminophen] Itching  . Tape Itching    Paper tape please.     Social History   Social History  . Marital status: Divorced    Spouse name: N/A  . Number of children: 3  . Years of education: N/A   Occupational History  . disabled    Social History Main Topics  . Smoking status: Current Every Day Smoker    Packs/day: 0.50    Years: 40.00    Types: Cigarettes  . Smokeless tobacco: Never Used  . Alcohol use No  . Drug use: No  . Sexual activity: No   Other Topics Concern  . Not on file   Social History Narrative   Has an apartment with a roommate. He was living on the streets in 01-23-2013.  He reports that his father died in [place] in 2013-01-23.  He is divorced.  He is no longer estranged from his son, but still from his daughter who lives locally.  Neither of his parents, nor any siblings have any history of CAD.    Physical Exam  Eyes: Pupils are equal, round, and reactive to light.  Pulmonary/Chest: No respiratory distress. He has no  wheezes. He has no rales.  Abdominal: He exhibits distension. There is no tenderness. There is no guarding.  Musculoskeletal: He exhibits no edema.  Skin: Skin is warm and dry. He is not diaphoretic.        No future appointments.  ATF pt CAO x4 sitting on the bed with no complaints.  Pt had two pill boxes missed 3-4 evening doses since our last visit.  He stated that he was very sad today because his close friend and neighbor was killed in a MVC last week.  He didn't weigh yesterday and we talked the importance of weighing again.  Pt still has home care aide coming by to cook and clean.  She comes in the evening now instead  of the mornings.  Pt denies sob, headache, dizziness and chest pain.  Pt's abdomen appeared swollen but he denies retaining fluid in his abdomen.  He has no issues sleeping or breathing while sleeping. He is continuing to watch what he's eating.  rx bottles verified and pill boxes refilled.    BP 108/70 (BP Location: Right Arm, Patient Position: Sitting)   Pulse 86   Wt 236 lb (107 kg)   SpO2 98%   BMI 39.27 kg/m  CBG 141   **rx called in:  Spiro (until [employer] 1st box/ no pills in thurs.. 2nd box) amio (filled until tues 2nd box/no pill in wed.Marland KitchenMarland KitchenMarland KitchenMarland Kitchen) atrovastin (filled until wed 2nd box/no pills thurs...) Cetirizine ( filled until thurs 2nd box/ no pills fri.Marland KitchenMarland KitchenMarland KitchenMarland Kitchen) Ferrous (both boxes complete) call in next week isoorbide (both boxes complete) call in next week Weight yesterday-235 Last visit weight-233    Arlen Dupuis, EMT Paramedic 09/03/2016   ACTION: Home visit completed

## 2016-09-04 ENCOUNTER — Other Ambulatory Visit (HOSPITAL_COMMUNITY): Payer: Self-pay

## 2016-09-04 ENCOUNTER — Ambulatory Visit (HOSPITAL_COMMUNITY): Payer: Medicare Other

## 2016-09-04 NOTE — Progress Notes (Signed)
Paramedicine Encounter    Patient ID: Jacob [name], male    DOB: [birth year], 61 y.o.   MRN: [id number]    Patient Care Team: [name], MD as PCP - General (Internal Medicine) Clarisa Schools, RN as Registered Nurse Jena Gauss, Gerrit Friends, MD as Consulting Physician (Gastroenterology)  Patient Active Problem List   Diagnosis Date Noted  . Congestive heart disease (HCC) 01/01/2016  . Diabetic neuropathy (HCC) 11/28/2015  . Ascites 11/09/2015  . CHF (congestive heart failure) (HCC) 11/09/2015  . Insomnia 04/18/2015  . Claudication of right lower extremity (HCC) 04/07/2015  . Cardiomyopathy, ischemic 04/07/2015  . Chronic anticoagulation 03/30/2015  . Cardiorenal syndrome with renal failure   . SOB (shortness of breath)   . Morbid obesity due to excess calories (HCC)   . Helicobacter pylori ab+ 12/12/2014  . Calf pain   . Duodenal ulcer with hemorrhage   . Hemorrhagic shock (HCC)   . Cardiogenic shock (HCC)   . Coronary artery disease involving native coronary artery of native heart without angina pectoris   . Atrial fibrillation with rapid ventricular response (HCC) 10/29/2014  . Chest pain   . Atherosclerosis of native arteries of extremity with intermittent claudication (HCC) 09/06/2014  . Atrial fibrillation with controlled ventricular response (HCC) 08/03/2014  . Bacteremia   . DM (diabetes mellitus), type 2 with peripheral vascular complications (HCC) 08/01/2014  . Anemia of chronic disease 08/01/2014  . PVD (peripheral vascular disease) (HCC) 10/29/2013  . Tobacco abuse 05/13/2013  . AF (atrial fibrillation) (HCC) 02/11/2013  . ICD - in place- BS May 2014 [facility] 02/11/2013  . Microcytic anemia 01/11/2013  . 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 Noncompliance 12/01/2011  . Essential hypertension 10/30/2006    Current Outpatient Prescriptions:  .  ACCU-CHEK SOFTCLIX LANCETS lancets, Use for once daily testing of blood sugar, Disp: 100 each, Rfl:  12 .  acetaminophen-codeine (TYLENOL #3) 300-30 MG tablet, Take 1 tablet by mouth every 12 (twelve) hours as needed for moderate pain. (Patient not taking: Reported on 07/22/2016), Disp: 60 tablet, Rfl: 0 .  albuterol (PROVENTIL HFA;VENTOLIN HFA) 108 (90 Base) MCG/ACT inhaler, Inhale 2 puffs into the lungs every 6 (six) hours as needed for wheezing or shortness of breath., Disp: 1 Inhaler, Rfl: 2 .  albuterol (PROVENTIL) (2.5 MG/3ML) 0.083% nebulizer solution, Take 3 mLs (2.5 mg total) by nebulization every 6 (six) hours as needed for wheezing or shortness of breath., Disp: 150 mL, Rfl: 1 .  amiodarone (PACERONE) 200 MG tablet, Take 1 tablet (200 mg total) by mouth daily., Disp: 30 tablet, Rfl: 3 .  atorvastatin (LIPITOR) 40 MG tablet, Take 1 tablet (40 mg total) by mouth daily at 6 PM., Disp: 30 tablet, Rfl: 5 .  Blood Glucose Monitoring Suppl (ACCU-CHEK AVIVA) device, Use as instructed1 times daily before meals, Disp: 1 each, Rfl: 0 .  cetirizine (ZYRTEC) 10 MG tablet, Take 1 tablet (10 mg total) by mouth daily., Disp: 30 tablet, Rfl: 1 .  digoxin (LANOXIN) 0.125 MG tablet, Take 0.5 tablets (0.0625 mg total) by mouth daily., Disp: 15 tablet, Rfl: 3 .  ferrous sulfate 325 (65 FE) MG tablet, TAKE 1 TABLET BY MOUTH 3 TIMES DAILY WITH MEALS., Disp: 90 tablet, Rfl: 2 .  gabapentin (NEURONTIN) 300 MG capsule, Take 1 capsule (300 mg total) by mouth 2 (two) times daily., Disp: 60 capsule, Rfl: 5 .  glipiZIDE (GLUCOTROL) 10 MG tablet, TAKE 1 TABLET BY MOUTH 2 TIMES DAILY BEFORE A  MEAL., Disp: 60 tablet, Rfl: 5 .  glucose blood (ACCU-CHEK AVIVA) test strip, Use as instructed for 1 times daily testing of blood sugar, Disp: 100 each, Rfl: 12 .  hydrALAZINE (APRESOLINE) 25 MG tablet, Take 1 tablet (25 mg total) by mouth 3 (three) times daily., Disp: 90 tablet, Rfl: 3 .  Insulin Glargine (LANTUS) 100 UNIT/ML Solostar Pen, Inject 15 Units into the skin daily at 10 pm., Disp: 2 pen, Rfl: 5 .  Insulin Pen Needle 31G  X 5 MM MISC, Use as directed for once daily insulin injection, Disp: 100 each, Rfl: 2 .  isosorbide mononitrate (IMDUR) 30 MG 24 hr tablet, Take 1 tablet (30 mg total) by mouth daily., Disp: 30 tablet, Rfl: 3 .  lactulose (CHRONULAC) 10 GM/15ML solution, Take 15 mLs (10 g total) by mouth 2 (two) times daily., Disp: 946 mL, Rfl: 1 .  Lancet Devices (ACCU-CHEK SOFTCLIX) lancets, Use as instructed for once daily testing of blood sugar, Disp: 1 each, Rfl: 0 .  meclizine (ANTIVERT) 25 MG tablet, Take 25 mg by mouth 2 (two) times daily as needed for nausea., Disp: , Rfl:  .  rivaroxaban (XARELTO) 20 MG TABS tablet, Take 1 tablet (20 mg total) by mouth daily before supper., Disp: 30 tablet, Rfl: 3 .  spironolactone (ALDACTONE) 25 MG tablet, Take 25 mg by mouth daily., Disp: , Rfl:  .  spironolactone (ALDACTONE) 25 MG tablet, TAKE 1 TABLET BY MOUTH DAILY., Disp: 90 tablet, Rfl: 4 .  torsemide (DEMADEX) 20 MG tablet, Take 4 tablets (80 mg total) by mouth 2 (two) times daily., Disp: 240 tablet, Rfl: 3 Allergies  Allergen Reactions  . Tylenol [Acetaminophen] Itching  . Tape Itching    Paper tape please.     Social History   Social History  . Marital status: Divorced    Spouse name: N/A  . Number of children: 3  . Years of education: N/A   Occupational History  . disabled    Social History Main Topics  . Smoking status: Current Every Day Smoker    Packs/day: 0.50    Years: 40.00    Types: Cigarettes  . Smokeless tobacco: Never Used  . Alcohol use No  . Drug use: No  . Sexual activity: No   Other Topics Concern  . Not on file   Social History Narrative   Has an apartment with a roommate. He was living on the streets in 01-15-13.  He reports that his father died in [place] in 01/15/2013.  He is divorced.  He is no longer estranged from his son, but still from his daughter who lives locally.  Neither of his parents, nor any siblings have any history of CAD.    Physical  Exam      No future appointments.  Re-visit for pill box revision/pt didn't come to the door.  His car is here and tv on.  Pt had several medications to pick up yesterday to complete his pill box.  I will f/u with pt later today.  There were no vitals taken for this visit.    Shani Fitch, EMT Paramedic 09/04/2016    ACTION: Next visit planned for asap Next call planned for f/u after lunch

## 2016-09-06 ENCOUNTER — Ambulatory Visit (HOSPITAL_COMMUNITY): Payer: Medicare Other

## 2016-09-09 ENCOUNTER — Ambulatory Visit (HOSPITAL_COMMUNITY): Payer: Medicare Other

## 2016-09-10 ENCOUNTER — Other Ambulatory Visit: Payer: Self-pay | Admitting: Internal Medicine

## 2016-09-11 ENCOUNTER — Ambulatory Visit (HOSPITAL_COMMUNITY): Payer: Medicare Other

## 2016-09-13 ENCOUNTER — Ambulatory Visit (HOSPITAL_COMMUNITY): Payer: Medicare Other

## 2016-09-16 ENCOUNTER — Ambulatory Visit (HOSPITAL_COMMUNITY): Payer: Medicare Other

## 2016-09-18 ENCOUNTER — Other Ambulatory Visit (HOSPITAL_COMMUNITY): Payer: Self-pay

## 2016-09-18 ENCOUNTER — Other Ambulatory Visit (HOSPITAL_COMMUNITY): Payer: Self-pay | Admitting: Pharmacist

## 2016-09-18 ENCOUNTER — Ambulatory Visit (HOSPITAL_COMMUNITY): Payer: Medicare Other

## 2016-09-18 DIAGNOSIS — I255 Ischemic cardiomyopathy: Secondary | ICD-10-CM

## 2016-09-18 MED ORDER — ISOSORBIDE MONONITRATE ER 30 MG PO TB24: 30.0000 mg | ORAL_TABLET | Freq: Every day | ORAL | 3 refills | Status: DC

## 2016-09-18 MED ORDER — TORSEMIDE 20 MG PO TABS: 80.0000 mg | ORAL_TABLET | Freq: Two times a day (BID) | ORAL | 3 refills | Status: DC

## 2016-09-18 MED FILL — ATORVASTATIN 40 MG TABLET: 40 | 30 days supply | Qty: 30 | Fill #0 | Status: TO

## 2016-09-18 MED FILL — TORSEMIDE 20 MG TABLET: 20 | 30 days supply | Qty: 240 | Fill #0 | Status: TO

## 2016-09-18 MED FILL — XARELTO 20 MG TABLET: 20 | 30 days supply | Qty: 30 | Fill #2 | Status: TO

## 2016-09-18 MED FILL — AMIODARONE HCL 200 MG TAB: 200 | 30 days supply | Qty: 30 | Fill #1 | Status: TO

## 2016-09-18 MED FILL — hydrALAZINE HCL 25 MG TABS: 25 | 30 days supply | Qty: 90 | Fill #1 | Status: TO

## 2016-09-18 MED FILL — SPIRONOLACTONE 25 MG TABLET: 25 | 90 days supply | Qty: 90 | Fill #0 | Status: TO

## 2016-09-18 MED FILL — ISOSORBIDE MN ER 30 MG TAB: 30 | 30 days supply | Qty: 30 | Fill #0 | Status: TO

## 2016-09-18 NOTE — Progress Notes (Signed)
Paramedicine Encounter    Patient ID: Jacob [name], male    DOB: 12/22/1955, 61 y.o.   MRN: [id number]    Patient Care Team: [name], MD as PCP - General (Internal Medicine) Clarisa Schools, RN as Registered Nurse Jena Gauss, Gerrit Friends, MD as Consulting Physician (Gastroenterology)  Patient Active Problem List   Diagnosis Date Noted  . Congestive heart disease (HCC) 01/01/2016  . Diabetic neuropathy (HCC) 11/28/2015  . Ascites 11/09/2015  . CHF (congestive heart failure) (HCC) 11/09/2015  . Insomnia 04/18/2015  . Claudication of right lower extremity (HCC) 04/07/2015  . Cardiomyopathy, ischemic 04/07/2015  . Chronic anticoagulation 03/30/2015  . Cardiorenal syndrome with renal failure   . SOB (shortness of breath)   . Morbid obesity due to excess calories (HCC)   . Helicobacter pylori ab+ 12/12/2014  . Calf pain   . Duodenal ulcer with hemorrhage   . Hemorrhagic shock (HCC)   . Cardiogenic shock (HCC)   . Coronary artery disease involving native coronary artery of native heart without angina pectoris   . Atrial fibrillation with rapid ventricular response (HCC) 10/29/2014  . Chest pain   . Atherosclerosis of native arteries of extremity with intermittent claudication (HCC) 09/06/2014  . Atrial fibrillation with controlled ventricular response (HCC) 08/03/2014  . Bacteremia   . DM (diabetes mellitus), type 2 with peripheral vascular complications (HCC) 08/01/2014  . Anemia of chronic disease 08/01/2014  . PVD (peripheral vascular disease) (HCC) 10/29/2013  . Tobacco abuse 05/13/2013  . AF (atrial fibrillation) (HCC) 02/11/2013  . ICD - in place- BS May 2014 [facility] 02/11/2013  . Microcytic anemia 01/11/2013  . 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 Noncompliance 12/01/2011  . Essential hypertension 10/30/2006    Current Outpatient Prescriptions:  .  amiodarone (PACERONE) 200 MG tablet, Take 1 tablet (200 mg total) by mouth daily., Disp: 30 tablet, Rfl:  3 .  atorvastatin (LIPITOR) 40 MG tablet, Take 1 tablet (40 mg total) by mouth daily at 6 PM., Disp: 30 tablet, Rfl: 5 .  digoxin (LANOXIN) 0.125 MG tablet, Take 0.5 tablets (0.0625 mg total) by mouth daily., Disp: 15 tablet, Rfl: 3 .  ferrous sulfate 325 (65 FE) MG tablet, TAKE 1 TABLET BY MOUTH 3 TIMES DAILY WITH MEALS., Disp: 90 tablet, Rfl: 2 .  gabapentin (NEURONTIN) 300 MG capsule, Take 1 capsule (300 mg total) by mouth 2 (two) times daily., Disp: 60 capsule, Rfl: 5 .  glipiZIDE (GLUCOTROL) 10 MG tablet, TAKE 1 TABLET BY MOUTH 2 TIMES DAILY BEFORE A MEAL., Disp: 60 tablet, Rfl: 5 .  isosorbide mononitrate (IMDUR) 30 MG 24 hr tablet, Take 1 tablet (30 mg total) by mouth daily., Disp: 30 tablet, Rfl: 3 .  rivaroxaban (XARELTO) 20 MG TABS tablet, Take 1 tablet (20 mg total) by mouth daily before supper., Disp: 30 tablet, Rfl: 3 .  spironolactone (ALDACTONE) 25 MG tablet, Take 25 mg by mouth daily., Disp: , Rfl:  .  torsemide (DEMADEX) 20 MG tablet, Take 4 tablets (80 mg total) by mouth 2 (two) times daily., Disp: 240 tablet, Rfl: 3 .  ACCU-CHEK SOFTCLIX LANCETS lancets, Use for once daily testing of blood sugar, Disp: 100 each, Rfl: 12 .  acetaminophen-codeine (TYLENOL #3) 300-30 MG tablet, Take 1 tablet by mouth every 12 (twelve) hours as needed for moderate pain. (Patient not taking: Reported on 07/22/2016), Disp: 60 tablet, Rfl: 0 .  albuterol (PROVENTIL HFA;VENTOLIN HFA) 108 (90 Base) MCG/ACT inhaler, Inhale 2 puffs into  the lungs every 6 (six) hours as needed for wheezing or shortness of breath., Disp: 1 Inhaler, Rfl: 2 .  albuterol (PROVENTIL) (2.5 MG/3ML) 0.083% nebulizer solution, Take 3 mLs (2.5 mg total) by nebulization every 6 (six) hours as needed for wheezing or shortness of breath., Disp: 150 mL, Rfl: 1 .  Blood Glucose Monitoring Suppl (ACCU-CHEK AVIVA) device, Use as instructed1 times daily before meals, Disp: 1 each, Rfl: 0 .  cetirizine (ZYRTEC) 10 MG tablet, Take 1 tablet (10 mg  total) by mouth daily., Disp: 30 tablet, Rfl: 1 .  glucose blood (ACCU-CHEK AVIVA) test strip, Use as instructed for 1 times daily testing of blood sugar, Disp: 100 each, Rfl: 12 .  hydrALAZINE (APRESOLINE) 25 MG tablet, Take 1 tablet (25 mg total) by mouth 3 (three) times daily., Disp: 90 tablet, Rfl: 3 .  Insulin Glargine (LANTUS) 100 UNIT/ML Solostar Pen, Inject 15 Units into the skin daily at 10 pm., Disp: 2 pen, Rfl: 5 .  Insulin Pen Needle 31G X 5 MM MISC, Use as directed for once daily insulin injection, Disp: 100 each, Rfl: 2 .  lactulose (CHRONULAC) 10 GM/15ML solution, Take 15 mLs (10 g total) by mouth 2 (two) times daily., Disp: 946 mL, Rfl: 1 .  Lancet Devices (ACCU-CHEK SOFTCLIX) lancets, Use as instructed for once daily testing of blood sugar, Disp: 1 each, Rfl: 0 .  meclizine (ANTIVERT) 25 MG tablet, Take 25 mg by mouth 2 (two) times daily as needed for nausea., Disp: , Rfl:  .  spironolactone (ALDACTONE) 25 MG tablet, TAKE 1 TABLET BY MOUTH DAILY., Disp: 90 tablet, Rfl: 4 Allergies  Allergen Reactions  . Tylenol [Acetaminophen] Itching  . Tape Itching    Paper tape please.     Social History   Social History  . Marital status: Divorced    Spouse name: N/A  . Number of children: 3  . Years of education: N/A   Occupational History  . disabled    Social History Main Topics  . Smoking status: Current Every Day Smoker    Packs/day: 0.50    Years: 40.00    Types: Cigarettes  . Smokeless tobacco: Never Used  . Alcohol use No  . Drug use: No  . Sexual activity: No   Other Topics Concern  . Not on file   Social History Narrative   Has an apartment with a roommate. He was living on the streets in January 16, 2013.  He reports that his father died in [place] in 2013-01-16.  He is divorced.  He is no longer estranged from his son, but still from his daughter who lives locally.  Neither of his parents, nor any siblings have any history of CAD.    Physical Exam   Pulmonary/Chest: No respiratory distress. He has no wheezes. He has no rales.  Abdominal: He exhibits distension. There is no tenderness. There is no guarding.  Musculoskeletal: He exhibits no edema.  Skin: Skin is warm and dry. He is not diaphoretic.        No future appointments.  ATF pt CAO x4 sitting on his bed waiting for for [facility] appointment.  Pt has no complaints today.  He has missed about 2 doses within the past 2 weeks.  Pt stated that he is sleeping very well at night and can sleep with only one pillow some nights which is improvement for him. He denies sob, dizziness, headache and chest pain.  He state that he urinated only once or  twice yesterday which is abnormal for him.  He denies back pain, feeling of bladder fullness and swelling in groin area. His aide is no longer coming over due to medicaid being cancelled.  He was notified of the same when he tried to pick up refills last week.  rx bottles verified and pill  Box refilled.  Theres several medications missing therefore [facility] visit next week to revise his pillbox.    Medicaid was cancelled two months ago but he went to DSS and it was renewed.  BP 118/66 (BP Location: Right Arm, Patient Position: Sitting, Cuff Size: Normal)   Pulse 88   Resp 12   Wt 238 lb (108 kg)   SpO2 98%   BMI 39.61 kg/m   cbg 185   **rx called in: Amiodarone (completely out) Atorvastatin (until mon even/none in tues) Ferrous (filled until tues/no pills in wed) Hydralazine isosobribe (filled until wed/none in thurs)erika Spirolactone (completely out) xarelto Torsemide erika  Weight yesterday-238 Last visit weight-236    Kim Lauver, EMT Paramedic 09/18/2016    ACTION: Home visit completed

## 2016-09-19 ENCOUNTER — Inpatient Hospital Stay (HOSPITAL_COMMUNITY): Admission: RE | Admit: 2016-09-19 | Payer: Medicare Other | Source: Ambulatory Visit

## 2016-09-19 ENCOUNTER — Other Ambulatory Visit (HOSPITAL_COMMUNITY): Payer: Self-pay | Admitting: Cardiology

## 2016-09-19 DIAGNOSIS — I509 Heart failure, unspecified: Secondary | ICD-10-CM

## 2016-09-20 ENCOUNTER — Ambulatory Visit (HOSPITAL_COMMUNITY): Payer: Medicare Other

## 2016-09-23 ENCOUNTER — Ambulatory Visit (HOSPITAL_COMMUNITY): Payer: Medicare Other

## 2016-09-26 ENCOUNTER — Ambulatory Visit (HOSPITAL_COMMUNITY)
Admission: RE | Admit: 2016-09-26 | Discharge: 2016-09-26 | Disposition: A | Discharge status: Home / Self Care | Payer: Medicare Other | Source: Ambulatory Visit | Attending: Internal Medicine | Admitting: Internal Medicine

## 2016-09-26 ENCOUNTER — Other Ambulatory Visit (HOSPITAL_COMMUNITY): Payer: Self-pay

## 2016-09-26 ENCOUNTER — Encounter (HOSPITAL_COMMUNITY): Payer: Self-pay

## 2016-09-26 VITALS — BP 116/70 | HR 105 | Wt 242.8 lb

## 2016-09-26 DIAGNOSIS — I4891 Unspecified atrial fibrillation: Secondary | ICD-10-CM | POA: Diagnosis not present

## 2016-09-26 DIAGNOSIS — I482 Chronic atrial fibrillation: Secondary | ICD-10-CM | POA: Insufficient documentation

## 2016-09-26 DIAGNOSIS — Z720 Tobacco use: Secondary | ICD-10-CM | POA: Diagnosis not present

## 2016-09-26 DIAGNOSIS — E7800 Pure hypercholesterolemia, unspecified: Secondary | ICD-10-CM | POA: Insufficient documentation

## 2016-09-26 DIAGNOSIS — I2510 Atherosclerotic heart disease of native coronary artery without angina pectoris: Secondary | ICD-10-CM | POA: Diagnosis not present

## 2016-09-26 DIAGNOSIS — Z833 Family history of diabetes mellitus: Secondary | ICD-10-CM | POA: Insufficient documentation

## 2016-09-26 DIAGNOSIS — E1151 Type 2 diabetes mellitus with diabetic peripheral angiopathy without gangrene: Secondary | ICD-10-CM | POA: Diagnosis not present

## 2016-09-26 DIAGNOSIS — I252 Old myocardial infarction: Secondary | ICD-10-CM | POA: Insufficient documentation

## 2016-09-26 DIAGNOSIS — Z951 Presence of aortocoronary bypass graft: Secondary | ICD-10-CM | POA: Insufficient documentation

## 2016-09-26 DIAGNOSIS — I255 Ischemic cardiomyopathy: Secondary | ICD-10-CM

## 2016-09-26 DIAGNOSIS — Z7901 Long term (current) use of anticoagulants: Secondary | ICD-10-CM | POA: Diagnosis not present

## 2016-09-26 DIAGNOSIS — Z8711 Personal history of peptic ulcer disease: Secondary | ICD-10-CM | POA: Insufficient documentation

## 2016-09-26 DIAGNOSIS — Z9111 Patient's noncompliance with dietary regimen: Secondary | ICD-10-CM | POA: Insufficient documentation

## 2016-09-26 DIAGNOSIS — I5042 Chronic combined systolic (congestive) and diastolic (congestive) heart failure: Secondary | ICD-10-CM | POA: Insufficient documentation

## 2016-09-26 DIAGNOSIS — I10 Essential (primary) hypertension: Secondary | ICD-10-CM

## 2016-09-26 DIAGNOSIS — I779 Disorder of arteries and arterioles, unspecified: Secondary | ICD-10-CM | POA: Diagnosis not present

## 2016-09-26 DIAGNOSIS — Z95810 Presence of automatic (implantable) cardiac defibrillator: Secondary | ICD-10-CM | POA: Diagnosis not present

## 2016-09-26 DIAGNOSIS — F17210 Nicotine dependence, cigarettes, uncomplicated: Secondary | ICD-10-CM | POA: Diagnosis not present

## 2016-09-26 DIAGNOSIS — I110 Hypertensive heart disease with heart failure: Secondary | ICD-10-CM | POA: Insufficient documentation

## 2016-09-26 DIAGNOSIS — Z794 Long term (current) use of insulin: Secondary | ICD-10-CM | POA: Insufficient documentation

## 2016-09-26 DIAGNOSIS — E785 Hyperlipidemia, unspecified: Secondary | ICD-10-CM | POA: Insufficient documentation

## 2016-09-26 DIAGNOSIS — K264 Chronic or unspecified duodenal ulcer with hemorrhage: Secondary | ICD-10-CM | POA: Diagnosis not present

## 2016-09-26 DIAGNOSIS — Z79899 Other long term (current) drug therapy: Secondary | ICD-10-CM | POA: Diagnosis not present

## 2016-09-26 LAB — BASIC METABOLIC PANEL
Anion gap: 11 (ref 5–15)
BUN: 15 mg/dL (ref 6–20)
CALCIUM: 8.5 mg/dL — AB (ref 8.9–10.3)
CO2: 28 mmol/L (ref 22–32)
CREATININE: 1.14 mg/dL (ref 0.61–1.24)
Chloride: 95 mmol/L — ABNORMAL LOW (ref 101–111)
GFR calc non Af Amer: >60 mL/min (ref >60)
Glucose, Bld: 202 mg/dL — ABNORMAL HIGH (ref 65–99)
Potassium: 3.3 mmol/L — ABNORMAL LOW (ref 3.5–5.1)
Sodium: 134 mmol/L — ABNORMAL LOW (ref 135–145)

## 2016-09-26 MED ORDER — TORSEMIDE 20 MG PO TABS: 80.0000 mg | ORAL_TABLET | Freq: Two times a day (BID) | ORAL | 3 refills | Status: DC

## 2016-09-26 MED ORDER — POTASSIUM CHLORIDE CRYS ER 20 MEQ PO TBCR: EXTENDED_RELEASE_TABLET | ORAL | 0 refills | Status: DC

## 2016-09-26 MED ORDER — METOLAZONE 2.5 MG PO TABS: ORAL_TABLET | ORAL | 0 refills | Status: DC

## 2016-09-26 NOTE — Progress Notes (Signed)
Paramedicine Encounter   Patient ID: Jacob [name] , male,   DOB: 06/23/1955,60 y.o.,  MRN: [id number]   Met patient in clinic today with provider Erin.   Pts weight is up today @ 242. Pt stated that he has been taking all of his medications. He still have several medications that need to be picked up from the pharmacy due to a lapse in medicaid. erin advised pt to take extra torsemide today and tomorrow and metolazone for the same.  Pt stated that he is not urinating that much anymore.  Come back for f/u in two weeks for f/u. I will visit with pt tomorrow to revise his pill box.   Time spent with patient 26 mins  Demetria Copper, EMT-Paramedic 09/26/2016   ACTION: Next visit planned for 7/6     

## 2016-09-26 NOTE — Patient Instructions (Addendum)
Routine lab work today. Will notify you of abnormal results, otherwise no news is good news!  INCREASE Torsemide to 100 mg (5 tabs) twice daily for TWO DAYS, then reduce to 80 mg (2 tabs) twice daily every day.  Take Metolazone 2.5 mg (1 tab) once today and once tomorrow only.  Take Potassium 40 meq (2 tabs) once today and once tomorrow only.  Follow up 2 weeks. Take all medication as prescribed the day of your appointment. Bring all medications with you to your appointment.  Do the following things EVERYDAY: 1) Weigh yourself in the morning before breakfast. Write it down and keep it in a log. 2) Take your medicines as prescribed 3) Eat low salt foods-Limit salt (sodium) to 2000 mg per day.  4) Stay as active as you can everyday 5) Limit all fluids for the day to less than 2 liters

## 2016-09-26 NOTE — Progress Notes (Signed)
Patient ID: Jacob [name], male   DOB: 14-Jul-1955, 61 y.o.   MRN: [id number]    Advanced Heart Failure Clinic Note   PCP: [facility] Primary HF Cardiologist: Dr Gala Romney   HPI: [name] is a 61 year old [place] male with history of systolic HF EF 20-25% via Echo 08/03/14 s/p INCEPTA ICD implant 6/14, RV dysfunction, CAD s/p CABG x 2 w/ RF MAZE 7/14 at [facility], DM type II, Chronic afib on xarelto, GI bleed 11/28/2014, and HLD.   Admitted the end of August 2016  with increased dyspnea on exertion. Later found to be in cardiogenic shock . At one point on dual pressors milrinone and norepi. Diuresed with IV lasix and transitioned to po lasix. Hospital course complicated by GI bleed and A fib RVR. Loaded on amio but later placed on toprol xl for rate control. On 9/5 had EGD with duodenal bleed with clip applied. He was discharged 9/12 with D/C weight 203 pounds.   Admitted late December 2016 with increased dyspnea. Diuresed with IV lasix and transitioned to po lasix 80 mg twice a day. Chronic A fib and continued on amio 200 mg twice a day. Discharge weight was 205 pounds.   Admitted 10/9 through 01/12/16 with marked volume overload. Diuresed with IV lasix and milrinone. D/C weight 209 pounds. He was provided medications at discharge.   He presents today for an add on visit for SOB and weight gain. Weight is 242 pounds, weight at last paramedicine visit was 238 pounds. SOB with walking into clinic, SOB with ADL's. He has been eating out, high sodium foods over the holiday. Taking medications, continues to follow with paramedicine.      SH: Lives in low income housing. Estranged from family ROS: All systems negative except as listed in HPI, PMH and Problem List.  SH:  Social History   Social History  . Marital status: Divorced    Spouse name: N/A  . Number of children: 3  . Years of education: N/A   Occupational History  . disabled    Social History Main Topics  . Smoking  status: Current Every Day Smoker    Packs/day: 0.50    Years: 40.00    Types: Cigarettes  . Smokeless tobacco: Never Used  . Alcohol use No  . Drug use: No  . Sexual activity: No   Other Topics Concern  . Not on file   Social History Narrative   Has an apartment with a roommate. He was living on the streets in 01-25-13.  He reports that his father died in [place] in 01-25-2013.  He is divorced.  He is no longer estranged from his son, but still from his daughter who lives locally.  Neither of his parents, nor any siblings have any history of CAD.    FH:  Family History  Problem Relation Age of Onset  . Diabetes Mother     Past Medical History:  Diagnosis Date  . Atrial fibrillation (HCC)    RVR 10/2014  . Automatic implantable cardioverter-defibrillator in situ   . CAD (coronary artery disease) Sept 2013   s/p cardiac cath showing occlusion of small RCA with collaterals  . CHF (congestive heart failure) (HCC)    20 to 25 % EF and RV dysfunction by 07/2014 echo   . Chronic anticoagulation    on xarelto.   . High cholesterol   . Hypertension   . Myocardial infarction (HCC) 2014  . Noncompliance  homelessness contributing.   . Peripheral arterial disease (HCC)   . Type II diabetes mellitus (HCC)     Current Outpatient Prescriptions  Medication Sig Dispense Refill  . ACCU-CHEK SOFTCLIX LANCETS lancets Use for once daily testing of blood sugar 100 each 12  . acetaminophen-codeine (TYLENOL #3) 300-30 MG tablet Take 1 tablet by mouth every 12 (twelve) hours as needed for moderate pain. 60 tablet 0  . albuterol (PROVENTIL HFA;VENTOLIN HFA) 108 (90 Base) MCG/ACT inhaler Inhale 2 puffs into the lungs every 6 (six) hours as needed for wheezing or shortness of breath. 1 Inhaler 2  . albuterol (PROVENTIL) (2.5 MG/3ML) 0.083% nebulizer solution Take 3 mLs (2.5 mg total) by nebulization every 6 (six) hours as needed for wheezing or shortness of breath. 150 mL 1  . amiodarone  (PACERONE) 200 MG tablet Take 1 tablet (200 mg total) by mouth daily. 30 tablet 3  . atorvastatin (LIPITOR) 40 MG tablet Take 1 tablet (40 mg total) by mouth daily at 6 PM. 30 tablet 5  . Blood Glucose Monitoring Suppl (ACCU-CHEK AVIVA) device Use as instructed1 times daily before meals 1 each 0  . cetirizine (ZYRTEC) 10 MG tablet Take 1 tablet (10 mg total) by mouth daily. 30 tablet 1  . digoxin (LANOXIN) 0.125 MG tablet Take 0.5 tablets (0.0625 mg total) by mouth daily. 15 tablet 3  . ferrous sulfate 325 (65 FE) MG tablet TAKE 1 TABLET BY MOUTH 3 TIMES DAILY WITH MEALS. 90 tablet 2  . gabapentin (NEURONTIN) 300 MG capsule Take 1 capsule (300 mg total) by mouth 2 (two) times daily. 60 capsule 5  . glipiZIDE (GLUCOTROL) 10 MG tablet TAKE 1 TABLET BY MOUTH 2 TIMES DAILY BEFORE A MEAL. 60 tablet 5  . glucose blood (ACCU-CHEK AVIVA) test strip Use as instructed for 1 times daily testing of blood sugar 100 each 12  . hydrALAZINE (APRESOLINE) 25 MG tablet Take 1 tablet (25 mg total) by mouth 3 (three) times daily. 90 tablet 3  . Insulin Glargine (LANTUS) 100 UNIT/ML Solostar Pen Inject 15 Units into the skin daily at 10 pm. 2 pen 5  . Insulin Pen Needle 31G X 5 MM MISC Use as directed for once daily insulin injection 100 each 2  . isosorbide mononitrate (IMDUR) 30 MG 24 hr tablet Take 1 tablet (30 mg total) by mouth daily. 30 tablet 3  . lactulose (CHRONULAC) 10 GM/15ML solution Take 15 mLs (10 g total) by mouth 2 (two) times daily. 946 mL 1  . Lancet Devices (ACCU-CHEK SOFTCLIX) lancets Use as instructed for once daily testing of blood sugar 1 each 0  . meclizine (ANTIVERT) 25 MG tablet Take 25 mg by mouth 2 (two) times daily as needed for nausea.    . rivaroxaban (XARELTO) 20 MG TABS tablet Take 1 tablet (20 mg total) by mouth daily before supper. 30 tablet 3  . spironolactone (ALDACTONE) 25 MG tablet TAKE 1 TABLET BY MOUTH DAILY. 90 tablet 4  . torsemide (DEMADEX) 20 MG tablet Take 4 tablets (80 mg  total) by mouth 2 (two) times daily. 240 tablet 3   No current facility-administered medications for this encounter.     Vitals:   09/26/16 1546  BP: 116/70  Pulse: (!) 105  SpO2: 97%  Weight: 242 lb 12.8 oz (110.1 kg)   Wt Readings from Last 3 Encounters:  09/26/16 242 lb 12.8 oz (110.1 kg)  09/18/16 238 lb (108 kg)  09/03/16 236 lb (107 kg)  PHYSICAL EXAM: General: Male, SOB walking into clinic.    HEENT: Normal Neck: supple. JVP to jaw. . Carotids 2+ bilaterally; no bruits. No thyromegaly or nodule noted. Cor: PMI normal.Irregularly irregular. No M/G/R appreciated.  Lungs: Clear bilaterally, normal effort.  Abdomen: Soft, non tender, non distended.  no HSM. No bruits or masses. +BS  Extremities: no cyanosis, clubbing, rash, 1+ edema to knees.  Neuro: alert & orientedx3, cranial nerves grossly intact. Moves all 4 extremities w/o difficulty. Affect pleasant.  ASSESSMENT & PLAN: 1.Chronic Systolic Heart Failure: Echo 10/2015 EF 25-30%. Has [employer] ICD.  - NYHA III today - Volume status elevated in the setting of dietary noncompliance - Increase torsemide to 100 mg BID for 2 days, then reduce to usual 80 mg BID.  - Take metolazone 2.5 mg today and tomorrow.  - Take KCl today and tomorrow. Normally, not on any K supplementation.  - Continue Spiro 25mg  daily.  - Continue digoxin 0.0625 mg daily. Dig level 0.3 in Feb. 2018 - Continue hydralazine 25 mg TID - Continue Imdur 30mg  daily.  - Reinforced the importance of dietary compliance.   2. Chronic A fib - Rate controlled. On Amiodarone 200 mg daily.  - Continue Xarelto for anticoagulation  3. H/O GI Bleed - duodenal ulcer with clip 11/2003 - No further   4. PAD- Last ABI 2015  - R EIA stent with known bilateral SFA occlusion.  - Follows with Dr. Allyson Sabal: "He is not a candidate for endovascular therapy of his SFAs and would require femoropopliteal bypass grafting which I think he would be high risk for  given his cardiac situation"  5. Social  Issues - Resides in low income housing.  - Nurse, adult.    6. Smoking - Continues to smoke.  - Encouraged cessation  7. HTN - Well controlled on current regimen.  BMET today. Follow up in 2 weeks.     Little Ishikawa, NP  3:57 PM

## 2016-09-27 ENCOUNTER — Ambulatory Visit (HOSPITAL_COMMUNITY): Payer: Medicare Other

## 2016-09-30 ENCOUNTER — Ambulatory Visit (HOSPITAL_COMMUNITY): Payer: Medicare Other

## 2016-10-01 ENCOUNTER — Other Ambulatory Visit (HOSPITAL_COMMUNITY): Payer: Self-pay

## 2016-10-01 NOTE — Progress Notes (Signed)
Paramedicine Encounter    Patient ID: Jacob [name], male    DOB: [birth year], 61 y.o.   MRN: [id number]    Patient Care Team: [name], MD as PCP - General (Internal Medicine) Clarisa Schools, RN as Registered Nurse Jena Gauss, Gerrit Friends, MD as Consulting Physician (Gastroenterology)  Patient Active Problem List   Diagnosis Date Noted  . Congestive heart disease (HCC) 01/01/2016  . Diabetic neuropathy (HCC) 11/28/2015  . Ascites 11/09/2015  . CHF (congestive heart failure) (HCC) 11/09/2015  . Insomnia 04/18/2015  . Claudication of right lower extremity (HCC) 04/07/2015  . Cardiomyopathy, ischemic 04/07/2015  . Chronic anticoagulation 03/30/2015  . Cardiorenal syndrome with renal failure   . SOB (shortness of breath)   . Morbid obesity due to excess calories (HCC)   . Helicobacter pylori ab+ 12/12/2014  . Calf pain   . Duodenal ulcer with hemorrhage   . Hemorrhagic shock (HCC)   . Cardiogenic shock (HCC)   . Coronary artery disease involving native coronary artery of native heart without angina pectoris   . Atrial fibrillation with rapid ventricular response (HCC) 10/29/2014  . Chest pain   . Atherosclerosis of native arteries of extremity with intermittent claudication (HCC) 09/06/2014  . Atrial fibrillation with controlled ventricular response (HCC) 08/03/2014  . Bacteremia   . DM (diabetes mellitus), type 2 with peripheral vascular complications (HCC) 08/01/2014  . Anemia of chronic disease 08/01/2014  . PVD (peripheral vascular disease) (HCC) 10/29/2013  . Tobacco abuse 05/13/2013  . AF (atrial fibrillation) (HCC) 02/11/2013  . ICD - in place- BS May 2014 [facility] 02/11/2013  . Microcytic anemia 01/11/2013  . 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 Noncompliance 12/01/2011  . Essential hypertension 10/30/2006    Current Outpatient Prescriptions:  .  albuterol (PROVENTIL HFA;VENTOLIN HFA) 108 (90 Base) MCG/ACT inhaler, Inhale 2 puffs into the lungs  every 6 (six) hours as needed for wheezing or shortness of breath., Disp: 1 Inhaler, Rfl: 2 .  albuterol (PROVENTIL) (2.5 MG/3ML) 0.083% nebulizer solution, Take 3 mLs (2.5 mg total) by nebulization every 6 (six) hours as needed for wheezing or shortness of breath., Disp: 150 mL, Rfl: 1 .  amiodarone (PACERONE) 200 MG tablet, Take 1 tablet (200 mg total) by mouth daily., Disp: 30 tablet, Rfl: 3 .  atorvastatin (LIPITOR) 40 MG tablet, Take 1 tablet (40 mg total) by mouth daily at 6 PM., Disp: 30 tablet, Rfl: 5 .  digoxin (LANOXIN) 0.125 MG tablet, Take 0.5 tablets (0.0625 mg total) by mouth daily., Disp: 15 tablet, Rfl: 3 .  gabapentin (NEURONTIN) 300 MG capsule, Take 1 capsule (300 mg total) by mouth 2 (two) times daily., Disp: 60 capsule, Rfl: 5 .  glipiZIDE (GLUCOTROL) 10 MG tablet, TAKE 1 TABLET BY MOUTH 2 TIMES DAILY BEFORE A MEAL., Disp: 60 tablet, Rfl: 5 .  hydrALAZINE (APRESOLINE) 25 MG tablet, Take 1 tablet (25 mg total) by mouth 3 (three) times daily., Disp: 90 tablet, Rfl: 3 .  isosorbide mononitrate (IMDUR) 30 MG 24 hr tablet, Take 1 tablet (30 mg total) by mouth daily., Disp: 30 tablet, Rfl: 3 .  rivaroxaban (XARELTO) 20 MG TABS tablet, Take 1 tablet (20 mg total) by mouth daily before supper., Disp: 30 tablet, Rfl: 3 .  spironolactone (ALDACTONE) 25 MG tablet, TAKE 1 TABLET BY MOUTH DAILY., Disp: 90 tablet, Rfl: 4 .  torsemide (DEMADEX) 20 MG tablet, Take 4 tablets (80 mg total) by mouth 2 (two) times daily., Disp: 240 tablet,  Rfl: 3 .  ACCU-CHEK SOFTCLIX LANCETS lancets, Use for once daily testing of blood sugar, Disp: 100 each, Rfl: 12 .  acetaminophen-codeine (TYLENOL #3) 300-30 MG tablet, Take 1 tablet by mouth every 12 (twelve) hours as needed for moderate pain., Disp: 60 tablet, Rfl: 0 .  Blood Glucose Monitoring Suppl (ACCU-CHEK AVIVA) device, Use as instructed1 times daily before meals, Disp: 1 each, Rfl: 0 .  cetirizine (ZYRTEC) 10 MG tablet, Take 1 tablet (10 mg total) by mouth  daily., Disp: 30 tablet, Rfl: 1 .  ferrous sulfate 325 (65 FE) MG tablet, TAKE 1 TABLET BY MOUTH 3 TIMES DAILY WITH MEALS., Disp: 90 tablet, Rfl: 2 .  glucose blood (ACCU-CHEK AVIVA) test strip, Use as instructed for 1 times daily testing of blood sugar, Disp: 100 each, Rfl: 12 .  Insulin Glargine (LANTUS) 100 UNIT/ML Solostar Pen, Inject 15 Units into the skin daily at 10 pm., Disp: 2 pen, Rfl: 5 .  Insulin Pen Needle 31G X 5 MM MISC, Use as directed for once daily insulin injection, Disp: 100 each, Rfl: 2 .  lactulose (CHRONULAC) 10 GM/15ML solution, Take 15 mLs (10 g total) by mouth 2 (two) times daily., Disp: 946 mL, Rfl: 1 .  Lancet Devices (ACCU-CHEK SOFTCLIX) lancets, Use as instructed for once daily testing of blood sugar, Disp: 1 each, Rfl: 0 .  meclizine (ANTIVERT) 25 MG tablet, Take 25 mg by mouth 2 (two) times daily as needed for nausea., Disp: , Rfl:  .  metolazone (ZAROXOLYN) 2.5 MG tablet, Take 2.5 mg (1 tab) once today and once tomorrow., Disp: 2 tablet, Rfl: 0 .  potassium chloride SA (KLOR-CON M20) 20 MEQ tablet, Take 40 meq (2 tabs) once today and once tomorrow., Disp: 4 tablet, Rfl: 0 Allergies  Allergen Reactions  . Tylenol [Acetaminophen] Itching  . Tape Itching    Paper tape please.     Social History   Social History  . Marital status: Divorced    Spouse name: N/A  . Number of children: 3  . Years of education: N/A   Occupational History  . disabled    Social History Main Topics  . Smoking status: Current Every Day Smoker    Packs/day: 0.50    Years: 40.00    Types: Cigarettes  . Smokeless tobacco: Never Used  . Alcohol use No  . Drug use: No  . Sexual activity: No   Other Topics Concern  . Not on file   Social History Narrative   Has an apartment with a roommate. He was living on the streets in 2013/01/09.  He reports that his father died in [place] in 01/09/13.  He is divorced.  He is no longer estranged from his son, but still from his  daughter who lives locally.  Neither of his parents, nor any siblings have any history of CAD.    Physical Exam  Pulmonary/Chest: No respiratory distress.  Abdominal: He exhibits no distension. There is no tenderness. There is no guarding.  Musculoskeletal: He exhibits no edema.  Skin: Skin is warm and dry. He is not diaphoretic.        Future Appointments Date Time Provider Department Center  10/10/2016 3:00 PM [facility] PA/NP [facility] None    ATF pt CAO x4 making coffee in the kitchen.  He stated that he has had trouble sleeping for the past two nights.  He think that its due to fluid retention.  [facility] but they told him that his rx for metolazone is not there and he needed to check back with the [facility] clinic.  Pt was advised by erin during the last visit to take metolazone for two days along with a extra torsemide.  He took the extra torsemide.  Pt denies sob,dizziness, headache and chest pain.    Spoke with susie and she advised that pt takes a metolazone today and tomorrow since he missed last week dose.  [facility] advised me that the prescriptions for potassium and metolazone was filled at the [facility] that [facility] to go to. Pt stated that he will go and pick up the prescriptions today.   rx bottles verified and pill box refilled.   BP 118/72 (BP Location: Right Arm, Patient Position: Sitting, Cuff Size: Normal)   Pulse 94   Resp 12   Wt 241 lb (109.3 kg)   SpO2 96%   BMI 40.10 kg/m  cbg 201  **rx called in: Ferrous sulfate Potassium Metolazone    Weight yesterday-didn't weigh Last visit weight-242    Reya Aurich, EMT Paramedic 10/01/2016    ACTION: Home visit completed Next visit planned for next tues

## 2016-10-02 ENCOUNTER — Ambulatory Visit (HOSPITAL_COMMUNITY): Payer: Medicare Other

## 2016-10-04 ENCOUNTER — Ambulatory Visit (HOSPITAL_COMMUNITY): Payer: Medicare Other

## 2016-10-07 ENCOUNTER — Ambulatory Visit (HOSPITAL_COMMUNITY): Payer: Medicare Other

## 2016-10-09 ENCOUNTER — Other Ambulatory Visit (HOSPITAL_COMMUNITY): Payer: Self-pay

## 2016-10-09 ENCOUNTER — Ambulatory Visit (HOSPITAL_COMMUNITY): Payer: Medicare Other

## 2016-10-09 ENCOUNTER — Other Ambulatory Visit (HOSPITAL_COMMUNITY): Payer: Self-pay | Admitting: Pharmacist

## 2016-10-09 MED ORDER — METOLAZONE 2.5 MG PO TABS: ORAL_TABLET | ORAL | 0 refills | Status: DC

## 2016-10-09 MED ORDER — POTASSIUM CHLORIDE CRYS ER 20 MEQ PO TBCR: EXTENDED_RELEASE_TABLET | ORAL | 0 refills | Status: DC

## 2016-10-09 NOTE — Progress Notes (Signed)
[facility] visit to revise pt's pill box with the medications that he was out of last week.  He still hasn't picked them up, he requested that they be sent to [facility] on [address].  I asked Cicero Duck the same.  Next [facility] visit with pt rescheduled for tomorrow @ [facility] clinic.

## 2016-10-10 ENCOUNTER — Encounter (HOSPITAL_COMMUNITY): Payer: Medicare Other

## 2016-10-11 ENCOUNTER — Ambulatory Visit (HOSPITAL_COMMUNITY): Payer: Medicare Other

## 2016-10-14 ENCOUNTER — Ambulatory Visit (HOSPITAL_COMMUNITY): Payer: Medicare Other

## 2016-10-15 ENCOUNTER — Other Ambulatory Visit (HOSPITAL_COMMUNITY): Payer: Self-pay

## 2016-10-15 NOTE — Progress Notes (Signed)
Paramedicine Encounter    Patient ID: Coulson Wehner, male    DOB: 09-Mar-1956, 61 y.o.   MRN: [id number]    Patient Care Team: [name], MD as PCP - General (Internal Medicine) Clarisa Schools, RN as Registered Nurse Jena Gauss, Gerrit Friends, MD as Consulting Physician (Gastroenterology)  Patient Active Problem List   Diagnosis Date Noted  . Congestive heart disease (HCC) 01/01/2016  . Diabetic neuropathy (HCC) 11/28/2015  . Ascites 11/09/2015  . CHF (congestive heart failure) (HCC) 11/09/2015  . Insomnia 04/18/2015  . Claudication of right lower extremity (HCC) 04/07/2015  . Cardiomyopathy, ischemic 04/07/2015  . Chronic anticoagulation 03/30/2015  . Cardiorenal syndrome with renal failure   . SOB (shortness of breath)   . Morbid obesity due to excess calories (HCC)   . Helicobacter pylori ab+ 12/12/2014  . Calf pain   . Duodenal ulcer with hemorrhage   . Hemorrhagic shock (HCC)   . Cardiogenic shock (HCC)   . Coronary artery disease involving native coronary artery of native heart without angina pectoris   . Atrial fibrillation with rapid ventricular response (HCC) 10/29/2014  . Chest pain   . Atherosclerosis of native arteries of extremity with intermittent claudication (HCC) 09/06/2014  . Atrial fibrillation with controlled ventricular response (HCC) 08/03/2014  . Bacteremia   . DM (diabetes mellitus), type 2 with peripheral vascular complications (HCC) 08/01/2014  . Anemia of chronic disease 08/01/2014  . PVD (peripheral vascular disease) (HCC) 10/29/2013  . Tobacco abuse 05/13/2013  . AF (atrial fibrillation) (HCC) 02/11/2013  . ICD - in place- BS May 2014 [facility] 02/11/2013  . Microcytic anemia 01/11/2013  . 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 Noncompliance 12/01/2011  . Essential hypertension 10/30/2006    Current Outpatient Prescriptions:  .  amiodarone (PACERONE) 200 MG tablet, Take 1 tablet (200 mg total) by mouth daily., Disp: 30 tablet, Rfl:  3 .  atorvastatin (LIPITOR) 40 MG tablet, Take 1 tablet (40 mg total) by mouth daily at 6 PM., Disp: 30 tablet, Rfl: 5 .  digoxin (LANOXIN) 0.125 MG tablet, Take 0.5 tablets (0.0625 mg total) by mouth daily., Disp: 15 tablet, Rfl: 3 .  gabapentin (NEURONTIN) 300 MG capsule, Take 1 capsule (300 mg total) by mouth 2 (two) times daily., Disp: 60 capsule, Rfl: 5 .  glipiZIDE (GLUCOTROL) 10 MG tablet, TAKE 1 TABLET BY MOUTH 2 TIMES DAILY BEFORE A MEAL., Disp: 60 tablet, Rfl: 5 .  hydrALAZINE (APRESOLINE) 25 MG tablet, Take 1 tablet (25 mg total) by mouth 3 (three) times daily., Disp: 90 tablet, Rfl: 3 .  Insulin Glargine (LANTUS) 100 UNIT/ML Solostar Pen, Inject 15 Units into the skin daily at 10 pm., Disp: 2 pen, Rfl: 5 .  Insulin Pen Needle 31G X 5 MM MISC, Use as directed for once daily insulin injection, Disp: 100 each, Rfl: 2 .  isosorbide mononitrate (IMDUR) 30 MG 24 hr tablet, Take 1 tablet (30 mg total) by mouth daily., Disp: 30 tablet, Rfl: 3 .  Lancet Devices (ACCU-CHEK SOFTCLIX) lancets, Use as instructed for once daily testing of blood sugar, Disp: 1 each, Rfl: 0 .  meclizine (ANTIVERT) 25 MG tablet, Take 25 mg by mouth 2 (two) times daily as needed for nausea., Disp: , Rfl:  .  potassium chloride SA (KLOR-CON M20) 20 MEQ tablet, Take 40 meq (2 tabs) once today and once tomorrow., Disp: 4 tablet, Rfl: 0 .  rivaroxaban (XARELTO) 20 MG TABS tablet, Take 1 tablet (20 mg total)  by mouth daily before supper., Disp: 30 tablet, Rfl: 3 .  spironolactone (ALDACTONE) 25 MG tablet, TAKE 1 TABLET BY MOUTH DAILY., Disp: 90 tablet, Rfl: 4 .  torsemide (DEMADEX) 20 MG tablet, Take 4 tablets (80 mg total) by mouth 2 (two) times daily., Disp: 240 tablet, Rfl: 3 .  ACCU-CHEK SOFTCLIX LANCETS lancets, Use for once daily testing of blood sugar, Disp: 100 each, Rfl: 12 .  acetaminophen-codeine (TYLENOL #3) 300-30 MG tablet, Take 1 tablet by mouth every 12 (twelve) hours as needed for moderate pain., Disp: 60  tablet, Rfl: 0 .  albuterol (PROVENTIL HFA;VENTOLIN HFA) 108 (90 Base) MCG/ACT inhaler, Inhale 2 puffs into the lungs every 6 (six) hours as needed for wheezing or shortness of breath., Disp: 1 Inhaler, Rfl: 2 .  albuterol (PROVENTIL) (2.5 MG/3ML) 0.083% nebulizer solution, Take 3 mLs (2.5 mg total) by nebulization every 6 (six) hours as needed for wheezing or shortness of breath., Disp: 150 mL, Rfl: 1 .  Blood Glucose Monitoring Suppl (ACCU-CHEK AVIVA) device, Use as instructed1 times daily before meals, Disp: 1 each, Rfl: 0 .  cetirizine (ZYRTEC) 10 MG tablet, Take 1 tablet (10 mg total) by mouth daily., Disp: 30 tablet, Rfl: 1 .  ferrous sulfate 325 (65 FE) MG tablet, TAKE 1 TABLET BY MOUTH 3 TIMES DAILY WITH MEALS., Disp: 90 tablet, Rfl: 2 .  glucose blood (ACCU-CHEK AVIVA) test strip, Use as instructed for 1 times daily testing of blood sugar, Disp: 100 each, Rfl: 12 .  lactulose (CHRONULAC) 10 GM/15ML solution, Take 15 mLs (10 g total) by mouth 2 (two) times daily. (Patient not taking: Reported on 10/15/2016), Disp: 946 mL, Rfl: 1 .  metolazone (ZAROXOLYN) 2.5 MG tablet, Take 2.5 mg (1 tab) once today and once tomorrow., Disp: 2 tablet, Rfl: 0 Allergies  Allergen Reactions  . Tylenol [Acetaminophen] Itching  . Tape Itching    Paper tape please.     Social History   Social History  . Marital status: Divorced    Spouse name: N/A  . Number of children: 3  . Years of education: N/A   Occupational History  . disabled    Social History Main Topics  . Smoking status: Current Every Day Smoker    Packs/day: 0.50    Years: 40.00    Types: Cigarettes  . Smokeless tobacco: Never Used  . Alcohol use No  . Drug use: No  . Sexual activity: No   Other Topics Concern  . Not on file   Social History Narrative   Has an apartment with a roommate. He was living on the streets in 19-Jan-2013.  He reports that his father died in [place] in 2013-01-19.  He is divorced.  He is no longer  estranged from his son, but still from his daughter who lives locally.  Neither of his parents, nor any siblings have any history of CAD.    Physical Exam  Pulmonary/Chest: No respiratory distress. He has no wheezes. He has no rales.  Abdominal: He exhibits no distension. There is no tenderness. There is no guarding.  Musculoskeletal: He exhibits no edema.  Skin: Skin is warm and dry. He is not diaphoretic.        No future appointments.  ATF pt CAO x4 sitting on his bed smoking a cigarette.  Pt stated that he is still missing several medications, unknown reason why.  He has taken medications from some slots but left others.  He denies sob, chest pain, headache,  and dizziness.  His sleeping habits has not changed and he is still receiving help from a home aid (cooking/cleaning).  Pt agreed to start receiving his medications from the [facility].  rx bottles verified and pill box refilled.    BP 112/68 (BP Location: Right Arm, Patient Position: Sitting, Cuff Size: Normal)   Pulse 91   Resp 16   Wt 236 lb (107 kg)   SpO2 100%   BMI 39.27 kg/m   Weight yesterday-236 Last visit weight-241  **gabapentin until tues second box/none in wed Glipizide until fri night/none in sat 1st box Potassium until wed 1st box Atorvastatin amio isosorbide Salaya Holtrop, EMT Paramedic 10/15/2016    ACTION: Home visit completed

## 2016-10-16 ENCOUNTER — Ambulatory Visit (HOSPITAL_COMMUNITY): Payer: Medicare Other

## 2016-10-18 ENCOUNTER — Ambulatory Visit (HOSPITAL_COMMUNITY): Payer: Medicare Other

## 2016-10-21 ENCOUNTER — Ambulatory Visit (HOSPITAL_COMMUNITY): Payer: Medicare Other

## 2016-10-22 ENCOUNTER — Other Ambulatory Visit (HOSPITAL_COMMUNITY): Payer: Self-pay

## 2016-10-22 NOTE — Progress Notes (Signed)
Paramedicine Encounter    Patient ID: Jacob [name], male    DOB: 11/16/1955, 61 y.o.   MRN: [id number]    Patient Care Team: [name], MD as PCP - General (Internal Medicine) Clarisa Schools, RN as Registered Nurse Jena Gauss, Gerrit Friends, MD as Consulting Physician (Gastroenterology)  Patient Active Problem List   Diagnosis Date Noted  . Congestive heart disease (HCC) 01/01/2016  . Diabetic neuropathy (HCC) 11/28/2015  . Ascites 11/09/2015  . CHF (congestive heart failure) (HCC) 11/09/2015  . Insomnia 04/18/2015  . Claudication of right lower extremity (HCC) 04/07/2015  . Cardiomyopathy, ischemic 04/07/2015  . Chronic anticoagulation 03/30/2015  . Cardiorenal syndrome with renal failure   . SOB (shortness of breath)   . Morbid obesity due to excess calories (HCC)   . Helicobacter pylori ab+ 12/12/2014  . Calf pain   . Duodenal ulcer with hemorrhage   . Hemorrhagic shock (HCC)   . Cardiogenic shock (HCC)   . Coronary artery disease involving native coronary artery of native heart without angina pectoris   . Atrial fibrillation with rapid ventricular response (HCC) 10/29/2014  . Chest pain   . Atherosclerosis of native arteries of extremity with intermittent claudication (HCC) 09/06/2014  . Atrial fibrillation with controlled ventricular response (HCC) 08/03/2014  . Bacteremia   . DM (diabetes mellitus), type 2 with peripheral vascular complications (HCC) 08/01/2014  . Anemia of chronic disease 08/01/2014  . PVD (peripheral vascular disease) (HCC) 10/29/2013  . Tobacco abuse 05/13/2013  . AF (atrial fibrillation) (HCC) 02/11/2013  . ICD - in place- BS May 2014 [facility] 02/11/2013  . Microcytic anemia 01/11/2013  . 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 Noncompliance 12/01/2011  . Essential hypertension 10/30/2006    Current Outpatient Prescriptions:  .  ACCU-CHEK SOFTCLIX LANCETS lancets, Use for once daily testing of blood sugar, Disp: 100 each, Rfl:  12 .  acetaminophen-codeine (TYLENOL #3) 300-30 MG tablet, Take 1 tablet by mouth every 12 (twelve) hours as needed for moderate pain., Disp: 60 tablet, Rfl: 0 .  albuterol (PROVENTIL HFA;VENTOLIN HFA) 108 (90 Base) MCG/ACT inhaler, Inhale 2 puffs into the lungs every 6 (six) hours as needed for wheezing or shortness of breath., Disp: 1 Inhaler, Rfl: 2 .  albuterol (PROVENTIL) (2.5 MG/3ML) 0.083% nebulizer solution, Take 3 mLs (2.5 mg total) by nebulization every 6 (six) hours as needed for wheezing or shortness of breath., Disp: 150 mL, Rfl: 1 .  amiodarone (PACERONE) 200 MG tablet, Take 1 tablet (200 mg total) by mouth daily., Disp: 30 tablet, Rfl: 3 .  atorvastatin (LIPITOR) 40 MG tablet, Take 1 tablet (40 mg total) by mouth daily at 6 PM., Disp: 30 tablet, Rfl: 5 .  Blood Glucose Monitoring Suppl (ACCU-CHEK AVIVA) device, Use as instructed1 times daily before meals, Disp: 1 each, Rfl: 0 .  cetirizine (ZYRTEC) 10 MG tablet, Take 1 tablet (10 mg total) by mouth daily., Disp: 30 tablet, Rfl: 1 .  digoxin (LANOXIN) 0.125 MG tablet, Take 0.5 tablets (0.0625 mg total) by mouth daily., Disp: 15 tablet, Rfl: 3 .  ferrous sulfate 325 (65 FE) MG tablet, TAKE 1 TABLET BY MOUTH 3 TIMES DAILY WITH MEALS., Disp: 90 tablet, Rfl: 2 .  gabapentin (NEURONTIN) 300 MG capsule, Take 1 capsule (300 mg total) by mouth 2 (two) times daily., Disp: 60 capsule, Rfl: 5 .  glipiZIDE (GLUCOTROL) 10 MG tablet, TAKE 1 TABLET BY MOUTH 2 TIMES DAILY BEFORE A MEAL., Disp: 60 tablet, Rfl: 5 .  glucose blood (ACCU-CHEK AVIVA) test strip, Use as instructed for 1 times daily testing of blood sugar, Disp: 100 each, Rfl: 12 .  hydrALAZINE (APRESOLINE) 25 MG tablet, Take 1 tablet (25 mg total) by mouth 3 (three) times daily., Disp: 90 tablet, Rfl: 3 .  Insulin Glargine (LANTUS) 100 UNIT/ML Solostar Pen, Inject 15 Units into the skin daily at 10 pm., Disp: 2 pen, Rfl: 5 .  Insulin Pen Needle 31G X 5 MM MISC, Use as directed for once daily  insulin injection, Disp: 100 each, Rfl: 2 .  isosorbide mononitrate (IMDUR) 30 MG 24 hr tablet, Take 1 tablet (30 mg total) by mouth daily., Disp: 30 tablet, Rfl: 3 .  lactulose (CHRONULAC) 10 GM/15ML solution, Take 15 mLs (10 g total) by mouth 2 (two) times daily. (Patient not taking: Reported on 10/15/2016), Disp: 946 mL, Rfl: 1 .  Lancet Devices (ACCU-CHEK SOFTCLIX) lancets, Use as instructed for once daily testing of blood sugar, Disp: 1 each, Rfl: 0 .  meclizine (ANTIVERT) 25 MG tablet, Take 25 mg by mouth 2 (two) times daily as needed for nausea., Disp: , Rfl:  .  metolazone (ZAROXOLYN) 2.5 MG tablet, Take 2.5 mg (1 tab) once today and once tomorrow., Disp: 2 tablet, Rfl: 0 .  potassium chloride SA (KLOR-CON M20) 20 MEQ tablet, Take 40 meq (2 tabs) once today and once tomorrow., Disp: 4 tablet, Rfl: 0 .  rivaroxaban (XARELTO) 20 MG TABS tablet, Take 1 tablet (20 mg total) by mouth daily before supper., Disp: 30 tablet, Rfl: 3 .  spironolactone (ALDACTONE) 25 MG tablet, TAKE 1 TABLET BY MOUTH DAILY., Disp: 90 tablet, Rfl: 4 .  torsemide (DEMADEX) 20 MG tablet, Take 4 tablets (80 mg total) by mouth 2 (two) times daily., Disp: 240 tablet, Rfl: 3 Allergies  Allergen Reactions  . Tylenol [Acetaminophen] Itching  . Tape Itching    Paper tape please.     Social History   Social History  . Marital status: Divorced    Spouse name: N/A  . Number of children: 3  . Years of education: N/A   Occupational History  . disabled    Social History Main Topics  . Smoking status: Current Every Day Smoker    Packs/day: 0.50    Years: 40.00    Types: Cigarettes  . Smokeless tobacco: Never Used  . Alcohol use No  . Drug use: No  . Sexual activity: No   Other Topics Concern  . Not on file   Social History Narrative   Has an apartment with a roommate. He was living on the streets in 2013-01-20.  He reports that his father died in [place] in 2013/01/20.  He is divorced.  He is no longer  estranged from his son, but still from his daughter who lives locally.  Neither of his parents, nor any siblings have any history of CAD.    Physical Exam  Pulmonary/Chest: No respiratory distress. He has no wheezes. He has no rales.  Abdominal: He exhibits no distension. There is no tenderness. There is no guarding.  Musculoskeletal: He exhibits no edema.  Skin: Skin is warm and dry. He is not diaphoretic.        No future appointments.  ATF pt CAO x4 sleeping.  Pt was given the bubble pack medications from [facility] today.  I explained to pt how to take the medications.  They will deliver to him for now on.  He denies sob, dizziness, headache and  chest pain.  His aide is still coming on mon,tue, thurs, fri to help clean.  she doesn't cook for him, he has been eating pizza and subway.  Pt is still smoking cigarettes daily. We talked about discontinuing CHP visits in Aug.  BP 114/60 (BP Location: Right Arm, Patient Position: Sitting, Cuff Size: Normal)   Pulse 100   Resp 16   Wt 236 lb (107 kg)   SpO2 98%   BMI 39.27 kg/m  cbg 222  Weight yesterday-237 Last visit weight-236    Barry Culverhouse, EMT Paramedic 10/22/2016    ACTION: Home visit completed

## 2016-10-23 ENCOUNTER — Ambulatory Visit (HOSPITAL_COMMUNITY): Payer: Medicare Other

## 2016-10-25 ENCOUNTER — Ambulatory Visit (HOSPITAL_COMMUNITY): Payer: Medicare Other

## 2016-10-28 ENCOUNTER — Ambulatory Visit (HOSPITAL_COMMUNITY): Payer: Medicare Other

## 2016-11-05 ENCOUNTER — Other Ambulatory Visit (HOSPITAL_COMMUNITY): Payer: Self-pay

## 2016-11-05 NOTE — Progress Notes (Signed)
Paramedicine Encounter    Patient ID: Jacob [name], male    DOB: 05/28/1955, 61 y.o.   MRN: [id number]    Patient Care Team: [name], MD as PCP - General (Internal Medicine) Clarisa Schools, RN as Registered Nurse Jena Gauss, Gerrit Friends, MD as Consulting Physician (Gastroenterology)  Patient Active Problem List   Diagnosis Date Noted  . Congestive heart disease (HCC) 01/01/2016  . Diabetic neuropathy (HCC) 11/28/2015  . Ascites 11/09/2015  . CHF (congestive heart failure) (HCC) 11/09/2015  . Insomnia 04/18/2015  . Claudication of right lower extremity (HCC) 04/07/2015  . Cardiomyopathy, ischemic 04/07/2015  . Chronic anticoagulation 03/30/2015  . Cardiorenal syndrome with renal failure   . SOB (shortness of breath)   . Morbid obesity due to excess calories (HCC)   . Helicobacter pylori ab+ 12/12/2014  . Calf pain   . Duodenal ulcer with hemorrhage   . Hemorrhagic shock (HCC)   . Cardiogenic shock (HCC)   . Coronary artery disease involving native coronary artery of native heart without angina pectoris   . Atrial fibrillation with rapid ventricular response (HCC) 10/29/2014  . Chest pain   . Atherosclerosis of native arteries of extremity with intermittent claudication (HCC) 09/06/2014  . Atrial fibrillation with controlled ventricular response (HCC) 08/03/2014  . Bacteremia   . DM (diabetes mellitus), type 2 with peripheral vascular complications (HCC) 08/01/2014  . Anemia of chronic disease 08/01/2014  . PVD (peripheral vascular disease) (HCC) 10/29/2013  . Tobacco abuse 05/13/2013  . AF (atrial fibrillation) (HCC) 02/11/2013  . ICD - in place- BS May 2014 [facility] 02/11/2013  . Microcytic anemia 01/11/2013  . CAD- s/p CABG July 2014 [facility] 12/04/2011  . Noncompliance 12/01/2011  . Essential hypertension 10/30/2006    Current Outpatient Prescriptions:  .  amiodarone (PACERONE) 200 MG tablet, Take 1 tablet (200 mg total) by mouth daily., Disp: 30 tablet, Rfl:  3 .  atorvastatin (LIPITOR) 40 MG tablet, Take 1 tablet (40 mg total) by mouth daily at 6 PM., Disp: 30 tablet, Rfl: 5 .  digoxin (LANOXIN) 0.125 MG tablet, Take 0.5 tablets (0.0625 mg total) by mouth daily., Disp: 15 tablet, Rfl: 3 .  hydrALAZINE (APRESOLINE) 25 MG tablet, Take 1 tablet (25 mg total) by mouth 3 (three) times daily., Disp: 90 tablet, Rfl: 3 .  isosorbide mononitrate (IMDUR) 30 MG 24 hr tablet, Take 1 tablet (30 mg total) by mouth daily., Disp: 30 tablet, Rfl: 3 .  rivaroxaban (XARELTO) 20 MG TABS tablet, Take 1 tablet (20 mg total) by mouth daily before supper., Disp: 30 tablet, Rfl: 3 .  torsemide (DEMADEX) 20 MG tablet, Take 4 tablets (80 mg total) by mouth 2 (two) times daily., Disp: 240 tablet, Rfl: 3 .  ACCU-CHEK SOFTCLIX LANCETS lancets, Use for once daily testing of blood sugar, Disp: 100 each, Rfl: 12 .  acetaminophen-codeine (TYLENOL #3) 300-30 MG tablet, Take 1 tablet by mouth every 12 (twelve) hours as needed for moderate pain., Disp: 60 tablet, Rfl: 0 .  albuterol (PROVENTIL HFA;VENTOLIN HFA) 108 (90 Base) MCG/ACT inhaler, Inhale 2 puffs into the lungs every 6 (six) hours as needed for wheezing or shortness of breath., Disp: 1 Inhaler, Rfl: 2 .  albuterol (PROVENTIL) (2.5 MG/3ML) 0.083% nebulizer solution, Take 3 mLs (2.5 mg total) by nebulization every 6 (six) hours as needed for wheezing or shortness of breath., Disp: 150 mL, Rfl: 1 .  Blood Glucose Monitoring Suppl (ACCU-CHEK AVIVA) device, Use as instructed1 times daily before meals, Disp:  1 each, Rfl: 0 .  cetirizine (ZYRTEC) 10 MG tablet, Take 1 tablet (10 mg total) by mouth daily., Disp: 30 tablet, Rfl: 1 .  ferrous sulfate 325 (65 FE) MG tablet, TAKE 1 TABLET BY MOUTH 3 TIMES DAILY WITH MEALS., Disp: 90 tablet, Rfl: 2 .  gabapentin (NEURONTIN) 300 MG capsule, Take 1 capsule (300 mg total) by mouth 2 (two) times daily., Disp: 60 capsule, Rfl: 5 .  glipiZIDE (GLUCOTROL) 10 MG tablet, TAKE 1 TABLET BY MOUTH 2 TIMES  DAILY BEFORE A MEAL., Disp: 60 tablet, Rfl: 5 .  glucose blood (ACCU-CHEK AVIVA) test strip, Use as instructed for 1 times daily testing of blood sugar, Disp: 100 each, Rfl: 12 .  Insulin Glargine (LANTUS) 100 UNIT/ML Solostar Pen, Inject 15 Units into the skin daily at 10 pm., Disp: 2 pen, Rfl: 5 .  Insulin Pen Needle 31G X 5 MM MISC, Use as directed for once daily insulin injection, Disp: 100 each, Rfl: 2 .  lactulose (CHRONULAC) 10 GM/15ML solution, Take 15 mLs (10 g total) by mouth 2 (two) times daily. (Patient not taking: Reported on 10/15/2016), Disp: 946 mL, Rfl: 1 .  Lancet Devices (ACCU-CHEK SOFTCLIX) lancets, Use as instructed for once daily testing of blood sugar, Disp: 1 each, Rfl: 0 .  meclizine (ANTIVERT) 25 MG tablet, Take 25 mg by mouth 2 (two) times daily as needed for nausea., Disp: , Rfl:  .  metolazone (ZAROXOLYN) 2.5 MG tablet, Take 2.5 mg (1 tab) once today and once tomorrow., Disp: 2 tablet, Rfl: 0 .  potassium chloride SA (KLOR-CON M20) 20 MEQ tablet, Take 40 meq (2 tabs) once today and once tomorrow., Disp: 4 tablet, Rfl: 0 .  spironolactone (ALDACTONE) 25 MG tablet, TAKE 1 TABLET BY MOUTH DAILY., Disp: 90 tablet, Rfl: 4 Allergies  Allergen Reactions  . Tylenol [Acetaminophen] Itching  . Tape Itching    Paper tape please.     Social History   Social History  . Marital status: Divorced    Spouse name: N/A  . Number of children: 3  . Years of education: N/A   Occupational History  . disabled    Social History Main Topics  . Smoking status: Current Every Day Smoker    Packs/day: 0.50    Years: 40.00    Types: Cigarettes  . Smokeless tobacco: Never Used  . Alcohol use No  . Drug use: No  . Sexual activity: No   Other Topics Concern  . Not on file   Social History Narrative   Has an apartment with a roommate. He was living on the streets in 07-Jan-2013.  He reports that his father died in [place] in 01-07-2013.  He is divorced.  He is no longer  estranged from his son, but still from his daughter who lives locally.  Neither of his parents, nor any siblings have any history of CAD.    Physical Exam  Pulmonary/Chest: No respiratory distress. He has no wheezes. He has no rales.  Abdominal: He exhibits no distension. There is no tenderness. There is no guarding.  Musculoskeletal: He exhibits no edema.  Skin: Skin is warm and dry. He is not diaphoretic.        Future Appointments Date Time Provider Department Center  11/11/2016 10:00 AM Jaclyn Shaggy, MD [facility] None    ATF pt CAO x4, pt just woke up.  Pt is now being seen 2 a month, he is receiving his medications via [facility].  Pt denies sob, dizziness, chest pain and headache.  He stated that he has been taking his meds as prescribed.  Pt has not had any issues sleeping and he hasn't had to add more pillows.  Pt's vitals. He has not taken his morning meds including insulin.  Pt stated that he will take his rx after we leave and he has his coffee.  rx packs verified.    Pt is currently out of: Ferrous sulfate Potassium Glipizide Gabapentin spriolactone  I called [facility] and they agreed to correct the pill packs that pt has currently.  [facility]  Pt requested that I leave the packs in his mailbox.    BP (!) 142/72 (BP Location: Right Arm)   Pulse 96   Resp 16   Wt 236 lb (107 kg)   SpO2 98%   BMI 39.27 kg/m  cbg 236  Weight yesterday-didn't weigh Last visit weight-236 (7/31)    Beonca Gibb, EMT Paramedic 11/05/2016    ACTION: Home visit completed Next visit planned for in two weeks

## 2016-11-11 ENCOUNTER — Other Ambulatory Visit (HOSPITAL_COMMUNITY): Payer: Self-pay | Admitting: Student

## 2016-11-11 ENCOUNTER — Encounter: Payer: Self-pay | Admitting: Family Medicine

## 2016-11-11 ENCOUNTER — Ambulatory Visit: Payer: Medicare Other | Attending: Family Medicine | Admitting: Family Medicine

## 2016-11-11 VITALS — BP 106/67 | HR 95 | Temp 97.8°F | Wt 236.8 lb

## 2016-11-11 DIAGNOSIS — E1159 Type 2 diabetes mellitus with other circulatory complications: Secondary | ICD-10-CM

## 2016-11-11 DIAGNOSIS — I5042 Chronic combined systolic (congestive) and diastolic (congestive) heart failure: Secondary | ICD-10-CM | POA: Insufficient documentation

## 2016-11-11 DIAGNOSIS — M25561 Pain in right knee: Secondary | ICD-10-CM | POA: Insufficient documentation

## 2016-11-11 DIAGNOSIS — I482 Chronic atrial fibrillation, unspecified: Secondary | ICD-10-CM

## 2016-11-11 DIAGNOSIS — I739 Peripheral vascular disease, unspecified: Secondary | ICD-10-CM | POA: Insufficient documentation

## 2016-11-11 DIAGNOSIS — Z794 Long term (current) use of insulin: Secondary | ICD-10-CM

## 2016-11-11 DIAGNOSIS — E1149 Type 2 diabetes mellitus with other diabetic neurological complication: Secondary | ICD-10-CM | POA: Diagnosis not present

## 2016-11-11 DIAGNOSIS — Z951 Presence of aortocoronary bypass graft: Secondary | ICD-10-CM | POA: Diagnosis not present

## 2016-11-11 DIAGNOSIS — E1151 Type 2 diabetes mellitus with diabetic peripheral angiopathy without gangrene: Secondary | ICD-10-CM | POA: Diagnosis not present

## 2016-11-11 DIAGNOSIS — E876 Hypokalemia: Secondary | ICD-10-CM | POA: Insufficient documentation

## 2016-11-11 DIAGNOSIS — R05 Cough: Secondary | ICD-10-CM | POA: Diagnosis not present

## 2016-11-11 DIAGNOSIS — I2510 Atherosclerotic heart disease of native coronary artery without angina pectoris: Secondary | ICD-10-CM | POA: Diagnosis not present

## 2016-11-11 DIAGNOSIS — I779 Disorder of arteries and arterioles, unspecified: Secondary | ICD-10-CM | POA: Diagnosis not present

## 2016-11-11 DIAGNOSIS — I25811 Atherosclerosis of native coronary artery of transplanted heart without angina pectoris: Secondary | ICD-10-CM | POA: Diagnosis not present

## 2016-11-11 DIAGNOSIS — Z79899 Other long term (current) drug therapy: Secondary | ICD-10-CM | POA: Insufficient documentation

## 2016-11-11 DIAGNOSIS — I110 Hypertensive heart disease with heart failure: Secondary | ICD-10-CM | POA: Insufficient documentation

## 2016-11-11 DIAGNOSIS — R059 Cough, unspecified: Secondary | ICD-10-CM

## 2016-11-11 LAB — POCT GLYCOSYLATED HEMOGLOBIN (HGB A1C): Hemoglobin A1C: 10.4

## 2016-11-11 LAB — GLUCOSE, POCT (MANUAL RESULT ENTRY): POC Glucose: 156 mg/dl — AB (ref 70–99)

## 2016-11-11 MED ORDER — TRAMADOL HCL 50 MG PO TABS: 50.0000 mg | ORAL_TABLET | Freq: Two times a day (BID) | ORAL | 0 refills | Status: DC | PRN

## 2016-11-11 MED ORDER — GABAPENTIN 300 MG PO CAPS: 300.0000 mg | ORAL_CAPSULE | Freq: Two times a day (BID) | ORAL | 5 refills | Status: DC

## 2016-11-11 MED ORDER — RIVAROXABAN 20 MG PO TABS
20.0000 mg | ORAL_TABLET | Freq: Every day | ORAL | 3 refills | Status: DC
Start: 2016-11-11 — End: 2016-11-11

## 2016-11-11 MED ORDER — GLIPIZIDE 10 MG PO TABS: ORAL_TABLET | ORAL | 5 refills | Status: DC

## 2016-11-11 MED ORDER — CETIRIZINE HCL 10 MG PO TABS: 10.0000 mg | ORAL_TABLET | Freq: Every day | ORAL | 1 refills | Status: DC

## 2016-11-11 MED ORDER — ATORVASTATIN CALCIUM 40 MG PO TABS: 40.0000 mg | ORAL_TABLET | Freq: Every day | ORAL | 5 refills | Status: DC

## 2016-11-11 MED ORDER — INSULIN PEN NEEDLE 31G X 5 MM MISC: 2 refills | Status: DC

## 2016-11-11 MED ORDER — INSULIN GLARGINE 100 UNIT/ML SOLOSTAR PEN: 25.0000 [IU] | PEN_INJECTOR | Freq: Every day | SUBCUTANEOUS | 5 refills | Status: DC

## 2016-11-11 MED FILL — traMADol HCL 50 MG TABS: 50 | 15 days supply | Qty: 30 | Fill #0

## 2016-11-11 MED FILL — TRUEPLUS PEN NDL 31G X 1/4: 31G X 6 MM | 30 days supply | Qty: 100 | Fill #0

## 2016-11-11 MED FILL — TRUEPLUS PEN NDL 31G X 1/4": 31G X 6 MM | 30 days supply | Qty: 100 | Fill #0

## 2016-11-11 NOTE — Progress Notes (Signed)
Subjective:  Patient ID: Jacob [name], male    DOB: 1955/12/15  Age: 61 y.o. 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  His A1c has trended up and is now 10.4 compared to 7.9 previously and he endorses compliance with all his medications but he has not been too compliant with a diabetic diet. He is requesting prescription for pen needles.  He denies shortness of breath, pedal edema and is closely followed by the paramedicine program and his weight has been stable. He has an upcoming appointment with cardiology.  He complains of right knee pain ever since he took a fall 3 weeks ago. Pain is absent at rest but present on the medial aspect when he attempts to walk. Denies swelling of the knee. He has claudication pain in the calf of his right leg and is followed by vascular - Dr Gwenlyn Found. Vascular ultrasound of the aorta/IVC/iliacs performed in 06/2016 revealed normal velocities in the right common iliac, bilateral external iliac; left common iliac, IVC were not seen due to abdominal girth.  Past Medical History:  Diagnosis Date  . Atrial fibrillation ([place])    RVR 10/2014  . Automatic implantable cardioverter-defibrillator in situ   . CAD (coronary artery disease) Sept 2013   s/p cardiac cath showing occlusion of small RCA with collaterals  . CHF (congestive heart failure) ([place])    20 to 25 % EF and RV dysfunction by 07/2014 echo   . Chronic anticoagulation    on xarelto.   . High cholesterol   . Hypertension   . Myocardial infarction ([place]) 2014  . Noncompliance    homelessness contributing.   . Peripheral arterial disease ([place])   . Type II diabetes mellitus ([place])      Past Surgical History:  Procedure Laterality Date  . CARDIAC CATHETERIZATION  09/2012  . CORONARY ANGIOPLASTY WITH STENT PLACEMENT  11/2011   "1"  . CORONARY ARTERY BYPASS GRAFT  09/2012   2 vessels per patient [name])   . ESOPHAGOGASTRODUODENOSCOPY N/A 11/28/2014   Procedure: ESOPHAGOGASTRODUODENOSCOPY (EGD);  Surgeon: Jerene Bears, MD;  Location: [facility] ENDOSCOPY;  Service: Endoscopy;  Laterality: N/A;  . ILIAC ARTERY STENT Right 08/30/2013  . IMPLANTABLE CARDIOVERTER DEFIBRILLATOR IMPLANT     [place] in 07/2012; [employer]  . LEFT AND RIGHT HEART CATHETERIZATION WITH CORONARY ANGIOGRAM N/A 12/02/2011   Procedure: LEFT AND RIGHT HEART CATHETERIZATION WITH CORONARY ANGIOGRAM;  Surgeon: Burnell Blanks, MD;  Location: [facility] CATH LAB;  Service: Cardiovascular;  Laterality: N/A;  . LOWER EXTREMITY ANGIOGRAM N/A 08/30/2013   Procedure: LOWER EXTREMITY ANGIOGRAM;  Surgeon: Lorretta Harp, MD;  Location: [facility] CATH LAB;  Service: Cardiovascular;  Laterality: N/A;  . LOWER EXTREMITY ANGIOGRAM N/A 12/02/2013   Procedure: LOWER EXTREMITY ANGIOGRAM;  Surgeon: Lorretta Harp, MD;  Location: [facility] CATH LAB;  Service: Cardiovascular;  Laterality: N/A;    Allergies  Allergen Reactions  . Tylenol [Acetaminophen] Itching  . Tape Itching    Paper tape please.     Outpatient Medications Prior to Visit  Medication Sig Dispense Refill  . ACCU-CHEK SOFTCLIX LANCETS lancets Use for once daily testing of blood sugar 100 each 12  . albuterol (PROVENTIL  HFA;VENTOLIN HFA) 108 (90 Base) MCG/ACT inhaler Inhale 2 puffs into the lungs every 6 (six) hours as needed for wheezing or shortness of breath. 1 Inhaler 2  . albuterol (PROVENTIL) (2.5 MG/3ML) 0.083% nebulizer solution Take 3 mLs (2.5 mg total) by nebulization every 6 (six) hours as needed for wheezing or shortness of breath. 150 mL 1  . amiodarone (PACERONE) 200 MG tablet Take 1 tablet (200 mg total) by mouth daily. 30 tablet 3  . Blood Glucose  Monitoring Suppl (ACCU-CHEK AVIVA) device Use as instructed1 times daily before meals 1 each 0  . digoxin (LANOXIN) 0.125 MG tablet Take 0.5 tablets (0.0625 mg total) by mouth daily. 15 tablet 3  . ferrous sulfate 325 (65 FE) MG tablet TAKE 1 TABLET BY MOUTH 3 TIMES DAILY WITH MEALS. 90 tablet 2  . glucose blood (ACCU-CHEK AVIVA) test strip Use as instructed for 1 times daily testing of blood sugar 100 each 12  . hydrALAZINE (APRESOLINE) 25 MG tablet Take 1 tablet (25 mg total) by mouth 3 (three) times daily. 90 tablet 3  . isosorbide mononitrate (IMDUR) 30 MG 24 hr tablet Take 1 tablet (30 mg total) by mouth daily. 30 tablet 3  . Lancet Devices (ACCU-CHEK SOFTCLIX) lancets Use as instructed for once daily testing of blood sugar 1 each 0  . meclizine (ANTIVERT) 25 MG tablet Take 25 mg by mouth 2 (two) times daily as needed for nausea.    . metolazone (ZAROXOLYN) 2.5 MG tablet Take 2.5 mg (1 tab) once today and once tomorrow. 2 tablet 0  . potassium chloride SA (KLOR-CON M20) 20 MEQ tablet Take 40 meq (2 tabs) once today and once tomorrow. 4 tablet 0  . spironolactone (ALDACTONE) 25 MG tablet TAKE 1 TABLET BY MOUTH DAILY. 90 tablet 4  . torsemide (DEMADEX) 20 MG tablet Take 4 tablets (80 mg total) by mouth 2 (two) times daily. 240 tablet 3  . acetaminophen-codeine (TYLENOL #3) 300-30 MG tablet Take 1 tablet by mouth every 12 (twelve) hours as needed for moderate pain. 60 tablet 0  . atorvastatin (LIPITOR) 40 MG tablet Take 1 tablet (40 mg total) by mouth daily at 6 PM. 30 tablet 5  . cetirizine (ZYRTEC) 10 MG tablet Take 1 tablet (10 mg total) by mouth daily. 30 tablet 1  . gabapentin (NEURONTIN) 300 MG capsule Take 1 capsule (300 mg total) by mouth 2 (two) times daily. 60 capsule 5  . glipiZIDE (GLUCOTROL) 10 MG tablet TAKE 1 TABLET BY MOUTH 2 TIMES DAILY BEFORE A MEAL. 60 tablet 5  . Insulin Glargine (LANTUS) 100 UNIT/ML Solostar Pen Inject 15 Units into the skin daily at 10 pm. 2 pen 5  . Insulin  Pen Needle 31G X 5 MM MISC Use as directed for once daily insulin injection 100 each 2  . rivaroxaban (XARELTO) 20 MG TABS tablet Take 1 tablet (20 mg total) by mouth daily before supper. 30 tablet 3  . lactulose (CHRONULAC) 10 GM/15ML solution Take 15 mLs (10 g total) by mouth 2 (two) times daily. (Patient not taking: Reported on 10/15/2016) 946 mL 1   No facility-administered medications prior to visit.     ROS Review of Systems  Constitutional: Negative for activity change and appetite change.  HENT: Negative for sinus pressure and sore throat.   Eyes: Negative for visual disturbance.  Respiratory: Negative for cough, chest tightness and shortness of breath.   Cardiovascular: Negative for chest pain and leg swelling.  Gastrointestinal: Negative for abdominal distention,  abdominal pain, constipation and diarrhea.  Endocrine: Negative.   Genitourinary: Negative for dysuria.  Musculoskeletal:       See hpi  Skin: Negative for rash.  Allergic/Immunologic: Negative.   Neurological: Negative for weakness, light-headedness and numbness.  Psychiatric/Behavioral: Negative for dysphoric mood and suicidal ideas.    Objective:  BP 106/67   Pulse 95   Temp 97.8 F (36.6 C) (Oral)   Wt 236 lb 12.8 oz (107.4 kg)   SpO2 96%   BMI 39.41 kg/m   BP/Weight 11/11/2016 11/05/2016 09/13/2977  Systolic BP 892 119 417  Diastolic BP 67 72 60  Wt. (Lbs) 236.8 236 236  BMI 39.41 39.27 39.27      Physical Exam  Constitutional: He is oriented to person, place, and time. He appears well-developed and well-nourished.  Cardiovascular: Normal rate, normal heart sounds and intact distal pulses.   No murmur heard. Pulmonary/Chest: Effort normal and breath sounds normal. He has no wheezes. He has no rales. He exhibits no tenderness.  Abdominal: Soft. Bowel sounds are normal. He exhibits no distension and no mass. There is no tenderness.  Musculoskeletal: Normal range of motion. He exhibits tenderness  (tenderness in medial aspect ofright knee on internal and external rotation). He exhibits no edema.  Neurological: He is alert and oriented to person, place, and time.  Skin: Skin is warm and dry.  Psychiatric: He has a normal mood and affect.     Assessment & Plan:   1. Other diabetic neurological complication associated with type 2 diabetes mellitus ([place]) Uncontrolled with A1c of 10.4 Which has increased from 7.9 previously Increased dose of Lantus to 25 units Diabetic diet Emphasize compliance - POCT glucose (manual entry) - POCT glycosylated hemoglobin (Hb A1C) - Insulin Glargine (LANTUS) 100 UNIT/ML Solostar Pen; Inject 25 Units into the skin daily at 10 pm.  Dispense: 4 pen; Refill: 5 - CMP14+EGFR - Ambulatory referral to Ophthalmology - Microalbumin/Creatinine Ratio, Urine - glipiZIDE (GLUCOTROL) 10 MG tablet; TAKE 1 TABLET BY MOUTH 2 TIMES DAILY BEFORE A MEAL.  Dispense: 60 tablet; Refill: 5 - Insulin Pen Needle 31G X 5 MM MISC; Use as directed for once daily insulin injection  Dispense: 100 each; Refill: 2  2. Chronic atrial fibrillation (HCC) CHADS 2 VASC score of 5 Continue Amiodarone Has an upcoming appointment with cardiology - rivaroxaban (XARELTO) 20 MG TABS tablet; Take 1 tablet (20 mg total) by mouth daily before supper.  Dispense: 30 tablet; Refill: 3  3. Acute pain of right knee Status post fall No pain at rest, pain is only present on range of motion Advised to apply heat, use knee brace - traMADol (ULTRAM) 50 MG tablet; Take 1 tablet (50 mg total) by mouth every 12 (twelve) hours as needed.  Dispense: 30 tablet; Refill: 0  4. PVD (peripheral vascular disease) ([place]) Still has claudication pain ABI from 06/2016 revealed no change from prior study and repeat recommended internal 12 months. Will need to reschedule with his vascular surgeon   5. Chronic combined systolic and diastolic congestive heart failure ([place]) S/p ICD EF 25-30% from 10/2015 Euvolemic,  weight is stable Continue daily weights, low sodium diet  6. Coronary artery disease involving native artery of transplanted heart without angina pectoris Risk factor modification - Lipid panel - atorvastatin (LIPITOR) 40 MG tablet; Take 1 tablet (40 mg total) by mouth daily at 6 PM.  Dispense: 30 tablet; Refill: 5  7. Cough - cetirizine (ZYRTEC) 10 MG tablet; Take 1 tablet (10 mg total)  by mouth daily.  Dispense: 30 tablet; Refill: 1  8. Type 2 diabetes mellitus with other circulatory complication, with long-term current use of insulin (HCC) - gabapentin (NEURONTIN) 300 MG capsule; Take 1 capsule (300 mg total) by mouth 2 (two) times daily.  Dispense: 60 capsule; Refill: 5  9. Hypokalemia Last potassium was 3.3 We'll repeat today and determine need for replacement      Meds ordered this encounter  Medications  . rivaroxaban (XARELTO) 20 MG TABS tablet    Sig: Take 1 tablet (20 mg total) by mouth daily before supper.    Dispense:  30 tablet    Refill:  3  . Insulin Glargine (LANTUS) 100 UNIT/ML Solostar Pen    Sig: Inject 25 Units into the skin daily at 10 pm.    Dispense:  4 pen    Refill:  5    Discontinue previous dose  . traMADol (ULTRAM) 50 MG tablet    Sig: Take 1 tablet (50 mg total) by mouth every 12 (twelve) hours as needed.    Dispense:  30 tablet    Refill:  0  . atorvastatin (LIPITOR) 40 MG tablet    Sig: Take 1 tablet (40 mg total) by mouth daily at 6 PM.    Dispense:  30 tablet    Refill:  5  . cetirizine (ZYRTEC) 10 MG tablet    Sig: Take 1 tablet (10 mg total) by mouth daily.    Dispense:  30 tablet    Refill:  1  . gabapentin (NEURONTIN) 300 MG capsule    Sig: Take 1 capsule (300 mg total) by mouth 2 (two) times daily.    Dispense:  60 capsule    Refill:  5  . glipiZIDE (GLUCOTROL) 10 MG tablet    Sig: TAKE 1 TABLET BY MOUTH 2 TIMES DAILY BEFORE A MEAL.    Dispense:  60 tablet    Refill:  5  . Insulin Pen Needle 31G X 5 MM MISC    Sig: Use as  directed for once daily insulin injection    Dispense:  100 each    Refill:  2    Follow-up: Return in about 3 months (around 02/11/2017) for follow up of chronic medical conditions.   This note has been created with Surveyor, quantity. Any transcriptional errors are unintentional.     Arnoldo Morale MD

## 2016-11-11 NOTE — Patient Instructions (Signed)
Diabetes Mellitus and Food It is important for you to manage your blood sugar (glucose) level. Your blood glucose level can be greatly affected by what you eat. Eating healthier foods in the appropriate amounts throughout the day at about the same time each day will help you control your blood glucose level. It can also help slow or prevent worsening of your diabetes mellitus. Healthy eating may even help you improve the level of your blood pressure and reach or maintain a healthy weight. General recommendations for healthful eating and cooking habits include:  Eating meals and snacks regularly. Avoid going long periods of time without eating to lose weight.  Eating a diet that consists mainly of plant-based foods, such as fruits, vegetables, nuts, legumes, and whole grains.  Using low-heat cooking methods, such as baking, instead of high-heat cooking methods, such as deep frying.  Work with your dietitian to make sure you understand how to use the Nutrition Facts information on food labels. How can food affect me? Carbohydrates Carbohydrates affect your blood glucose level more than any other type of food. Your dietitian will help you determine how many carbohydrates to eat at each meal and teach you how to count carbohydrates. Counting carbohydrates is important to keep your blood glucose at a healthy level, especially if you are using insulin or taking certain medicines for diabetes mellitus. Alcohol Alcohol can cause sudden decreases in blood glucose (hypoglycemia), especially if you use insulin or take certain medicines for diabetes mellitus. Hypoglycemia can be a life-threatening condition. Symptoms of hypoglycemia (sleepiness, dizziness, and disorientation) are similar to symptoms of having too much alcohol. If your health care provider has given you approval to drink alcohol, do so in moderation and use the following guidelines:  Women should not have more than one drink per day, and men  should not have more than two drinks per day. One drink is equal to: ? 12 oz of beer. ? 5 oz of wine. ? 1 oz of hard liquor.  Do not drink on an empty stomach.  Keep yourself hydrated. Have water, diet soda, or unsweetened iced tea.  Regular soda, juice, and other mixers might contain a lot of carbohydrates and should be counted.  What foods are not recommended? As you make food choices, it is important to remember that all foods are not the same. Some foods have fewer nutrients per serving than other foods, even though they might have the same number of calories or carbohydrates. It is difficult to get your body what it needs when you eat foods with fewer nutrients. Examples of foods that you should avoid that are high in calories and carbohydrates but low in nutrients include:  Trans fats (most processed foods list trans fats on the Nutrition Facts label).  Regular soda.  Juice.  Candy.  Sweets, such as cake, pie, doughnuts, and cookies.  Fried foods.  What foods can I eat? Eat nutrient-rich foods, which will nourish your body and keep you healthy. The food you should eat also will depend on several factors, including:  The calories you need.  The medicines you take.  Your weight.  Your blood glucose level.  Your blood pressure level.  Your cholesterol level.  You should eat a variety of foods, including:  Protein. ? Lean cuts of meat. ? Proteins low in saturated fats, such as fish, egg whites, and beans. Avoid processed meats.  Fruits and vegetables. ? Fruits and vegetables that may help control blood glucose levels, such as apples,   mangoes, and yams.  Dairy products. ? Choose fat-free or low-fat dairy products, such as milk, yogurt, and cheese.  Grains, bread, pasta, and rice. ? Choose whole grain products, such as multigrain bread, whole oats, and brown rice. These foods may help control blood pressure.  Fats. ? Foods containing healthful fats, such as  nuts, avocado, olive oil, canola oil, and fish.  Does everyone with diabetes mellitus have the same meal plan? Because every person with diabetes mellitus is different, there is not one meal plan that works for everyone. It is very important that you meet with a dietitian who will help you create a meal plan that is just right for you. This information is not intended to replace advice given to you by your health care provider. Make sure you discuss any questions you have with your health care provider. Document Released: 12/06/2004 Document Revised: 08/17/2015 Document Reviewed: 02/05/2013 Elsevier Interactive Patient Education  2017 Elsevier Inc.  

## 2016-11-12 ENCOUNTER — Telehealth (HOSPITAL_COMMUNITY): Payer: Self-pay

## 2016-11-12 ENCOUNTER — Other Ambulatory Visit (HOSPITAL_COMMUNITY): Payer: Self-pay

## 2016-11-12 LAB — LIPID PANEL
Chol/HDL Ratio: 3.6 ratio (ref 0.0–5.0)
Cholesterol, Total: 120 mg/dL (ref 100–199)
HDL: 33 mg/dL — ABNORMAL LOW (ref >39)
LDL Calculated: 67 mg/dL (ref 0–99)
Triglycerides: 102 mg/dL (ref 0–149)
VLDL Cholesterol Cal: 20 mg/dL (ref 5–40)

## 2016-11-12 LAB — CMP14+EGFR
ALBUMIN: 4.1 g/dL (ref 3.5–5.5)
ALT: 14 IU/L (ref 0–44)
AST: 19 IU/L (ref 0–40)
Albumin/Globulin Ratio: 1.2 (ref 1.2–2.2)
Alkaline Phosphatase: 142 IU/L — ABNORMAL HIGH (ref 39–117)
BUN/Creatinine Ratio: 18 (ref 9–20)
BUN: 23 mg/dL (ref 6–24)
Bilirubin Total: 0.4 mg/dL (ref 0.0–1.2)
CALCIUM: 8.8 mg/dL (ref 8.7–10.2)
CO2: 26 mmol/L (ref 20–29)
CREATININE: 1.25 mg/dL (ref 0.76–1.27)
Chloride: 93 mmol/L — ABNORMAL LOW (ref 96–106)
GFR calc non Af Amer: 63 mL/min/{1.73_m2} (ref >59)
GFR, EST AFRICAN AMERICAN: 72 mL/min/{1.73_m2} (ref >59)
Globulin, Total: 3.4 g/dL (ref 1.5–4.5)
Glucose: 150 mg/dL — ABNORMAL HIGH (ref 65–99)
POTASSIUM: 3.9 mmol/L (ref 3.5–5.2)
Sodium: 140 mmol/L (ref 134–144)
TOTAL PROTEIN: 7.5 g/dL (ref 6.0–8.5)

## 2016-11-12 MED ORDER — METOLAZONE 2.5 MG PO TABS: ORAL_TABLET | ORAL | 0 refills | Status: DC

## 2016-11-12 NOTE — Progress Notes (Signed)
Paramedicine Encounter    Patient ID: Jacob [name], male    DOB: 03-25-1956, 61 y.o.   MRN: [id number]    Patient Care Team: Jacob Angst, MD as PCP - General (Internal Medicine) Jacob Schools, RN as Registered Nurse Jacob [name], Jacob Friends, MD as Consulting Physician (Gastroenterology)  Patient Active Problem List   Diagnosis Date Noted  . Congestive heart disease (HCC) 01/01/2016  . Diabetic neuropathy (HCC) 11/28/2015  . Ascites 11/09/2015  . CHF (congestive heart failure) (HCC) 11/09/2015  . Insomnia 04/18/2015  . Claudication of right lower extremity (HCC) 04/07/2015  . Cardiomyopathy, ischemic 04/07/2015  . Chronic anticoagulation 03/30/2015  . Cardiorenal syndrome with renal failure   . SOB (shortness of breath)   . Morbid obesity due to excess calories (HCC)   . Helicobacter pylori ab+ 12/12/2014  . Calf pain   . Duodenal ulcer with hemorrhage   . Hemorrhagic shock (HCC)   . Cardiogenic shock (HCC)   . Coronary artery disease involving native coronary artery of native heart without angina pectoris   . Atrial fibrillation with rapid ventricular response (HCC) 10/29/2014  . Chest pain   . Atherosclerosis of native arteries of extremity with intermittent claudication (HCC) 09/06/2014  . Atrial fibrillation with controlled ventricular response (HCC) 08/03/2014  . Bacteremia   . DM (diabetes mellitus), type 2 with peripheral vascular complications (HCC) 08/01/2014  . Anemia of chronic disease 08/01/2014  . PVD (peripheral vascular disease) (HCC) 10/29/2013  . Tobacco abuse 05/13/2013  . AF (atrial fibrillation) (HCC) 02/11/2013  . ICD - in place- BS May 2014 [facility] 02/11/2013  . Microcytic anemia 01/11/2013  . 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 Noncompliance 12/01/2011  . Essential hypertension 10/30/2006    Current Outpatient Prescriptions:  .  amiodarone (PACERONE) 200 MG tablet, take 1 TABLET BY MOUTH EVERY DAY every morning, Disp: 30 tablet,  Rfl: 3 .  atorvastatin (LIPITOR) 40 MG tablet, Take 1 tablet (40 mg total) by mouth daily at 6 PM., Disp: 30 tablet, Rfl: 5 .  digoxin (LANOXIN) 0.125 MG tablet, Take 0.5 tablets (0.0625 mg total) by mouth daily., Disp: 15 tablet, Rfl: 3 .  ACCU-CHEK SOFTCLIX LANCETS lancets, Use for once daily testing of blood sugar, Disp: 100 each, Rfl: 12 .  albuterol (PROVENTIL HFA;VENTOLIN HFA) 108 (90 Base) MCG/ACT inhaler, Inhale 2 puffs into the lungs every 6 (six) hours as needed for wheezing or shortness of breath., Disp: 1 Inhaler, Rfl: 2 .  albuterol (PROVENTIL) (2.5 MG/3ML) 0.083% nebulizer solution, Take 3 mLs (2.5 mg total) by nebulization every 6 (six) hours as needed for wheezing or shortness of breath., Disp: 150 mL, Rfl: 1 .  Blood Glucose Monitoring Suppl (ACCU-CHEK AVIVA) device, Use as instructed1 times daily before meals, Disp: 1 each, Rfl: 0 .  cetirizine (ZYRTEC) 10 MG tablet, Take 1 tablet (10 mg total) by mouth daily., Disp: 30 tablet, Rfl: 1 .  ferrous sulfate 325 (65 FE) MG tablet, TAKE 1 TABLET BY MOUTH 3 TIMES DAILY WITH MEALS., Disp: 90 tablet, Rfl: 2 .  gabapentin (NEURONTIN) 300 MG capsule, Take 1 capsule (300 mg total) by mouth 2 (two) times daily., Disp: 60 capsule, Rfl: 5 .  glipiZIDE (GLUCOTROL) 10 MG tablet, TAKE 1 TABLET BY MOUTH 2 TIMES DAILY BEFORE A MEAL., Disp: 60 tablet, Rfl: 5 .  glucose blood (ACCU-CHEK AVIVA) test strip, Use as instructed for 1 times daily testing of blood sugar, Disp: 100 each, Rfl: 12 .  hydrALAZINE (  APRESOLINE) 25 MG tablet, take 1 TABLET BY MOUTH THREE TIMES DAILY every morning, noon,evening, Disp: 90 tablet, Rfl: 3 .  Insulin Glargine (LANTUS) 100 UNIT/ML Solostar Pen, Inject 25 Units into the skin daily at 10 pm., Disp: 4 pen, Rfl: 5 .  Insulin Pen Needle 31G X 5 MM MISC, Use as directed for once daily insulin injection, Disp: 100 each, Rfl: 2 .  isosorbide mononitrate (IMDUR) 30 MG 24 hr tablet, Take 1 tablet (30 mg total) by mouth daily., Disp:  30 tablet, Rfl: 3 .  lactulose (CHRONULAC) 10 GM/15ML solution, Take 15 mLs (10 g total) by mouth 2 (two) times daily. (Patient not taking: Reported on 10/15/2016), Disp: 946 mL, Rfl: 1 .  Lancet Devices (ACCU-CHEK SOFTCLIX) lancets, Use as instructed for once daily testing of blood sugar, Disp: 1 each, Rfl: 0 .  meclizine (ANTIVERT) 25 MG tablet, Take 25 mg by mouth 2 (two) times daily as needed for nausea., Disp: , Rfl:  .  metolazone (ZAROXOLYN) 2.5 MG tablet, Take 2.5 mg (1 tab) once today and once tomorrow., Disp: 2 tablet, Rfl: 0 .  potassium chloride SA (KLOR-CON M20) 20 MEQ tablet, Take 40 meq (2 tabs) once today and once tomorrow., Disp: 4 tablet, Rfl: 0 .  rivaroxaban (XARELTO) 20 MG TABS tablet, Take 1 tablet (20 mg total) by mouth daily before supper., Disp: 30 tablet, Rfl: 3 .  spironolactone (ALDACTONE) 25 MG tablet, TAKE 1 TABLET BY MOUTH DAILY., Disp: 90 tablet, Rfl: 4 .  torsemide (DEMADEX) 20 MG tablet, Take 4 tablets (80 mg total) by mouth 2 (two) times daily., Disp: 240 tablet, Rfl: 3 .  traMADol (ULTRAM) 50 MG tablet, Take 1 tablet (50 mg total) by mouth every 12 (twelve) hours as needed., Disp: 30 tablet, Rfl: 0 Allergies  Allergen Reactions  . Tylenol [Acetaminophen] Itching  . Tape Itching    Paper tape please.     Social History   Social History  . Marital status: Divorced    Spouse name: N/A  . Number of children: 3  . Years of education: N/A   Occupational History  . disabled    Social History Main Topics  . Smoking status: Current Every Day Smoker    Packs/day: 0.50    Years: 40.00    Types: Cigarettes  . Smokeless tobacco: Never Used  . Alcohol use No  . Drug use: No  . Sexual activity: No   Other Topics Concern  . Not on file   Social History Narrative   Has an apartment with a roommate. He was living on the streets in 01/18/2013.  He reports that his father died in [place] in January 18, 2013.  He is divorced.  He is no longer estranged from his  son, but still from his daughter who lives locally.  Neither of his parents, nor any siblings have any history of CAD.    Physical Exam  Pulmonary/Chest: No respiratory distress. He has no wheezes. He has no rales.  Abdominal: He exhibits no distension. There is no tenderness. There is no guarding.  Musculoskeletal: He exhibits no edema.  Skin: Skin is warm and dry. He is not diaphoretic.        Future Appointments Date Time Provider Department Center  11/18/2016 10:00 AM [facility] PA/NP [facility] None  02/11/2017 3:00 PM Jaclyn Shaggy, MD [facility] None    ATF pt CAO x4 sitting on his bed with no complaints.  Pt stated that he doesn't have any sob and  chest pain. He stated that he didn't "eat any thing bad" but he wouldn't tell me what he did eat.  Theres a bottle of hot sause beside his bed and a 32oz cup from [employer] on the table.  Pt has taken his medications without difficulty.  rx pack verified.  AHF clinic notified of the same via vm.  I advised pt that I will f/u with him after speaking with the heart failure clinic calls.   BP 128/78 (BP Location: Right Arm, Patient Position: Sitting, Cuff Size: Normal)   Pulse 90   Resp 16   Wt 243 lb (110.2 kg)   BMI 40.44 kg/m   Weight yesterday-236 Last visit weight-236    Tandy Grawe, EMT Paramedic 11/12/2016    ACTION: Home visit completed Next visit planned for two weeks

## 2016-11-12 NOTE — Telephone Encounter (Signed)
Dee with CHF Paramedicine program called to report 7 lb weight gain overnight for patient. Denies swelling or SOB but does report hot sauce on bedside table. Advised per Otilio Saber PA-C to give one 2.5 mg metolazone tablet and follow up if s/s do not approve or return. Patient due to be seen in 6 days, reminded of upcoming apt.  Ave Filter, RN

## 2016-11-13 ENCOUNTER — Other Ambulatory Visit (HOSPITAL_COMMUNITY): Payer: Self-pay | Admitting: Cardiology

## 2016-11-13 MED ORDER — METOLAZONE 2.5 MG PO TABS: ORAL_TABLET | ORAL | 0 refills | Status: DC

## 2016-11-14 MED FILL — metOLazone 2.5 MG TABS: 2.5 | 3 days supply | Qty: 3 | Fill #0

## 2016-11-15 ENCOUNTER — Other Ambulatory Visit (HOSPITAL_COMMUNITY): Payer: Self-pay

## 2016-11-15 NOTE — Progress Notes (Signed)
Paramedicine Encounter    Patient ID: Jacob [name], male    DOB: [birth year], 61 y.o.   MRN: [id number]    Patient Care Team: [name], MD as PCP - General (Internal Medicine) Clarisa Schools, RN as Registered Nurse Jena Gauss, Gerrit Friends, MD as Consulting Physician (Gastroenterology)  Patient Active Problem List   Diagnosis Date Noted  . Congestive heart disease (HCC) 01/01/2016  . Diabetic neuropathy (HCC) 11/28/2015  . Ascites 11/09/2015  . CHF (congestive heart failure) (HCC) 11/09/2015  . Insomnia 04/18/2015  . Claudication of right lower extremity (HCC) 04/07/2015  . Cardiomyopathy, ischemic 04/07/2015  . Chronic anticoagulation 03/30/2015  . Cardiorenal syndrome with renal failure   . SOB (shortness of breath)   . Morbid obesity due to excess calories (HCC)   . Helicobacter pylori ab+ 12/12/2014  . Calf pain   . Duodenal ulcer with hemorrhage   . Hemorrhagic shock (HCC)   . Cardiogenic shock (HCC)   . Coronary artery disease involving native coronary artery of native heart without angina pectoris   . Atrial fibrillation with rapid ventricular response (HCC) 10/29/2014  . Chest pain   . Atherosclerosis of native arteries of extremity with intermittent claudication (HCC) 09/06/2014  . Atrial fibrillation with controlled ventricular response (HCC) 08/03/2014  . Bacteremia   . DM (diabetes mellitus), type 2 with peripheral vascular complications (HCC) 08/01/2014  . Anemia of chronic disease 08/01/2014  . PVD (peripheral vascular disease) (HCC) 10/29/2013  . Tobacco abuse 05/13/2013  . AF (atrial fibrillation) (HCC) 02/11/2013  . ICD - in place- BS May 2014 [facility] 02/11/2013  . Microcytic anemia 01/11/2013  . 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 Noncompliance 12/01/2011  . Essential hypertension 10/30/2006    Current Outpatient Prescriptions:  .  ACCU-CHEK SOFTCLIX LANCETS lancets, Use for once daily testing of blood sugar, Disp: 100 each, Rfl:  12 .  albuterol (PROVENTIL HFA;VENTOLIN HFA) 108 (90 Base) MCG/ACT inhaler, Inhale 2 puffs into the lungs every 6 (six) hours as needed for wheezing or shortness of breath., Disp: 1 Inhaler, Rfl: 2 .  albuterol (PROVENTIL) (2.5 MG/3ML) 0.083% nebulizer solution, Take 3 mLs (2.5 mg total) by nebulization every 6 (six) hours as needed for wheezing or shortness of breath., Disp: 150 mL, Rfl: 1 .  amiodarone (PACERONE) 200 MG tablet, take 1 TABLET BY MOUTH EVERY DAY every morning, Disp: 30 tablet, Rfl: 3 .  atorvastatin (LIPITOR) 40 MG tablet, Take 1 tablet (40 mg total) by mouth daily at 6 PM., Disp: 30 tablet, Rfl: 5 .  Blood Glucose Monitoring Suppl (ACCU-CHEK AVIVA) device, Use as instructed1 times daily before meals, Disp: 1 each, Rfl: 0 .  cetirizine (ZYRTEC) 10 MG tablet, Take 1 tablet (10 mg total) by mouth daily., Disp: 30 tablet, Rfl: 1 .  digoxin (LANOXIN) 0.125 MG tablet, Take 0.5 tablets (0.0625 mg total) by mouth daily., Disp: 14 tablet, Rfl: 2 .  ferrous sulfate 325 (65 FE) MG tablet, TAKE 1 TABLET BY MOUTH 3 TIMES DAILY WITH MEALS., Disp: 90 tablet, Rfl: 2 .  gabapentin (NEURONTIN) 300 MG capsule, Take 1 capsule (300 mg total) by mouth 2 (two) times daily., Disp: 60 capsule, Rfl: 5 .  glipiZIDE (GLUCOTROL) 10 MG tablet, TAKE 1 TABLET BY MOUTH 2 TIMES DAILY BEFORE A MEAL., Disp: 60 tablet, Rfl: 5 .  glucose blood (ACCU-CHEK AVIVA) test strip, Use as instructed for 1 times daily testing of blood sugar, Disp: 100 each, Rfl: 12 .  hydrALAZINE (  APRESOLINE) 25 MG tablet, take 1 TABLET BY MOUTH THREE TIMES DAILY every morning, noon,evening, Disp: 90 tablet, Rfl: 3 .  Insulin Glargine (LANTUS) 100 UNIT/ML Solostar Pen, Inject 25 Units into the skin daily at 10 pm., Disp: 4 pen, Rfl: 5 .  Insulin Pen Needle 31G X 5 MM MISC, Use as directed for once daily insulin injection, Disp: 100 each, Rfl: 2 .  isosorbide mononitrate (IMDUR) 30 MG 24 hr tablet, Take 1 tablet (30 mg total) by mouth daily., Disp:  30 tablet, Rfl: 3 .  lactulose (CHRONULAC) 10 GM/15ML solution, Take 15 mLs (10 g total) by mouth 2 (two) times daily. (Patient not taking: Reported on 10/15/2016), Disp: 946 mL, Rfl: 1 .  Lancet Devices (ACCU-CHEK SOFTCLIX) lancets, Use as instructed for once daily testing of blood sugar, Disp: 1 each, Rfl: 0 .  meclizine (ANTIVERT) 25 MG tablet, Take 25 mg by mouth 2 (two) times daily as needed for nausea., Disp: , Rfl:  .  metolazone (ZAROXOLYN) 2.5 MG tablet, Take 1 tablet once as directed by CHF clinic., Disp: 3 tablet, Rfl: 0 .  potassium chloride SA (KLOR-CON M20) 20 MEQ tablet, Take 40 meq (2 tabs) once today and once tomorrow., Disp: 4 tablet, Rfl: 0 .  spironolactone (ALDACTONE) 25 MG tablet, TAKE 1 TABLET BY MOUTH DAILY., Disp: 90 tablet, Rfl: 4 .  torsemide (DEMADEX) 20 MG tablet, Take 4 tablets (80 mg total) by mouth 2 (two) times daily., Disp: 240 tablet, Rfl: 3 .  traMADol (ULTRAM) 50 MG tablet, Take 1 tablet (50 mg total) by mouth every 12 (twelve) hours as needed., Disp: 30 tablet, Rfl: 0 .  XARELTO 20 MG TABS tablet, take 1 TABLET BY MOUTH before supper in the evening, Disp: 28 tablet, Rfl: 2 Allergies  Allergen Reactions  . Tylenol [Acetaminophen] Itching  . Tape Itching    Paper tape please.     Social History   Social History  . Marital status: Divorced    Spouse name: N/A  . Number of children: 3  . Years of education: N/A   Occupational History  . disabled    Social History Main Topics  . Smoking status: Current Every Day Smoker    Packs/day: 0.50    Years: 40.00    Types: Cigarettes  . Smokeless tobacco: Never Used  . Alcohol use No  . Drug use: No  . Sexual activity: No   Other Topics Concern  . Not on file   Social History Narrative   Has an apartment with a roommate. He was living on the streets in 02/05/13.  He reports that his father died in [place] in 05-Feb-2013.  He is divorced.  He is no longer estranged from his son, but still from his  daughter who lives locally.  Neither of his parents, nor any siblings have any history of CAD.    Physical Exam      Future Appointments Date Time Provider Department Center  11/18/2016 10:00 AM [facility] PA/NP [facility] None  02/11/2017 3:00 PM Jaclyn Shaggy, MD [facility] None    I called pt to advise him that the metolazone is ready for pick up.  The pharmacy couldn't fill the prescription prior to 8/23 due to [facility] billing.  Pt was advised by the nurse at [facility] to take a metolazone due to pt's weight gain reported to them on Tuesday this week.  Pt answered the phone and stated that he was in the bathroom.  I told  him that it would take me about 20 mins to get to his apartment.  I also has his pill packs from [facility] and his diabetic needles.    Pt did not answer the door therefore I left his meds in the mailbox outside of his door.  I also left ot a message stating the same.  I will f/u with him to make sure he takes the metolazone as prescribed.    Naria Abbey, EMT Paramedic 11/15/2016  ACTION: Home visit completed

## 2016-11-17 IMAGING — CR DG CHEST 2V
2 series · 2 of 2 positions shown · non-contrast
Comparison: 11/11/2014

CLINICAL DATA: Short of breath

EXAM:
CHEST  2 VIEW

[chest pa]
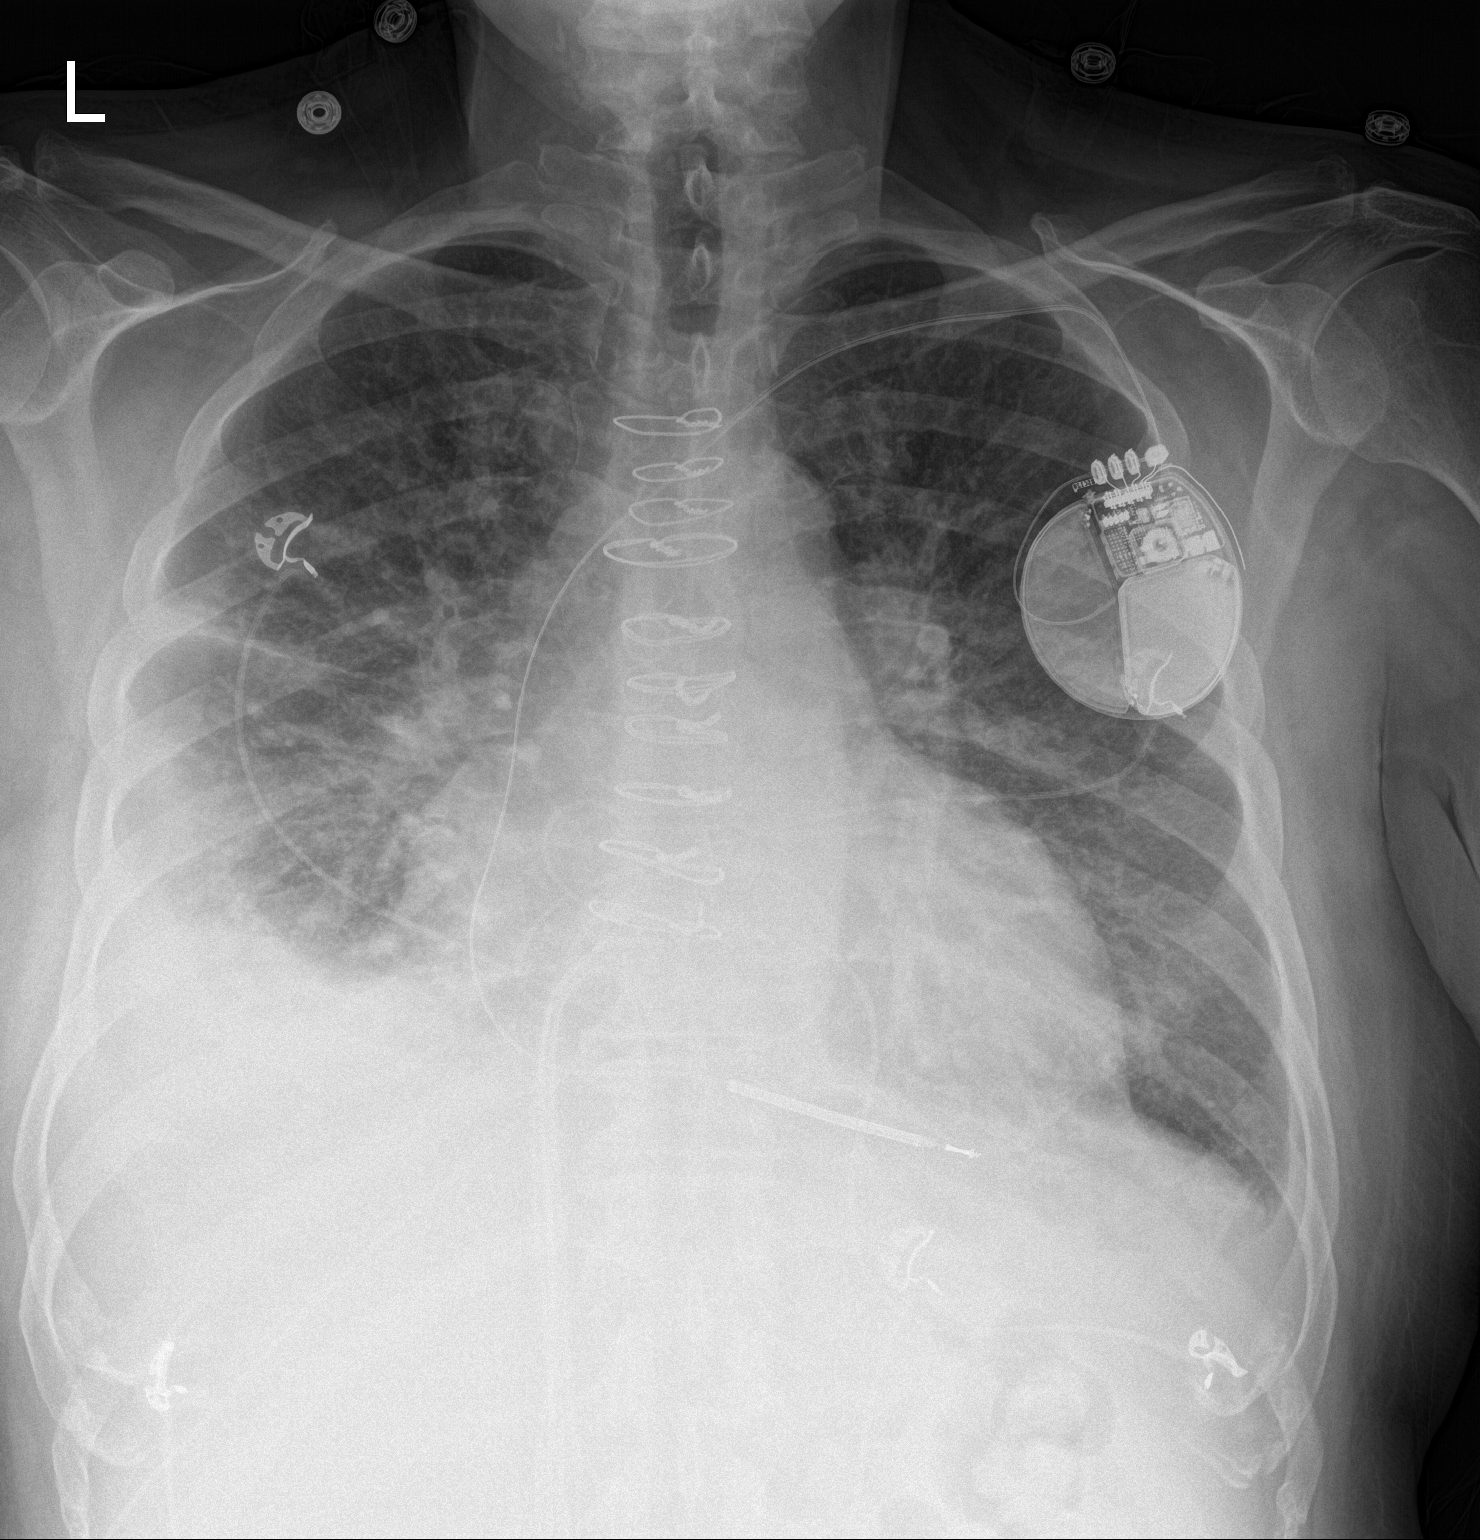

[chest lat]
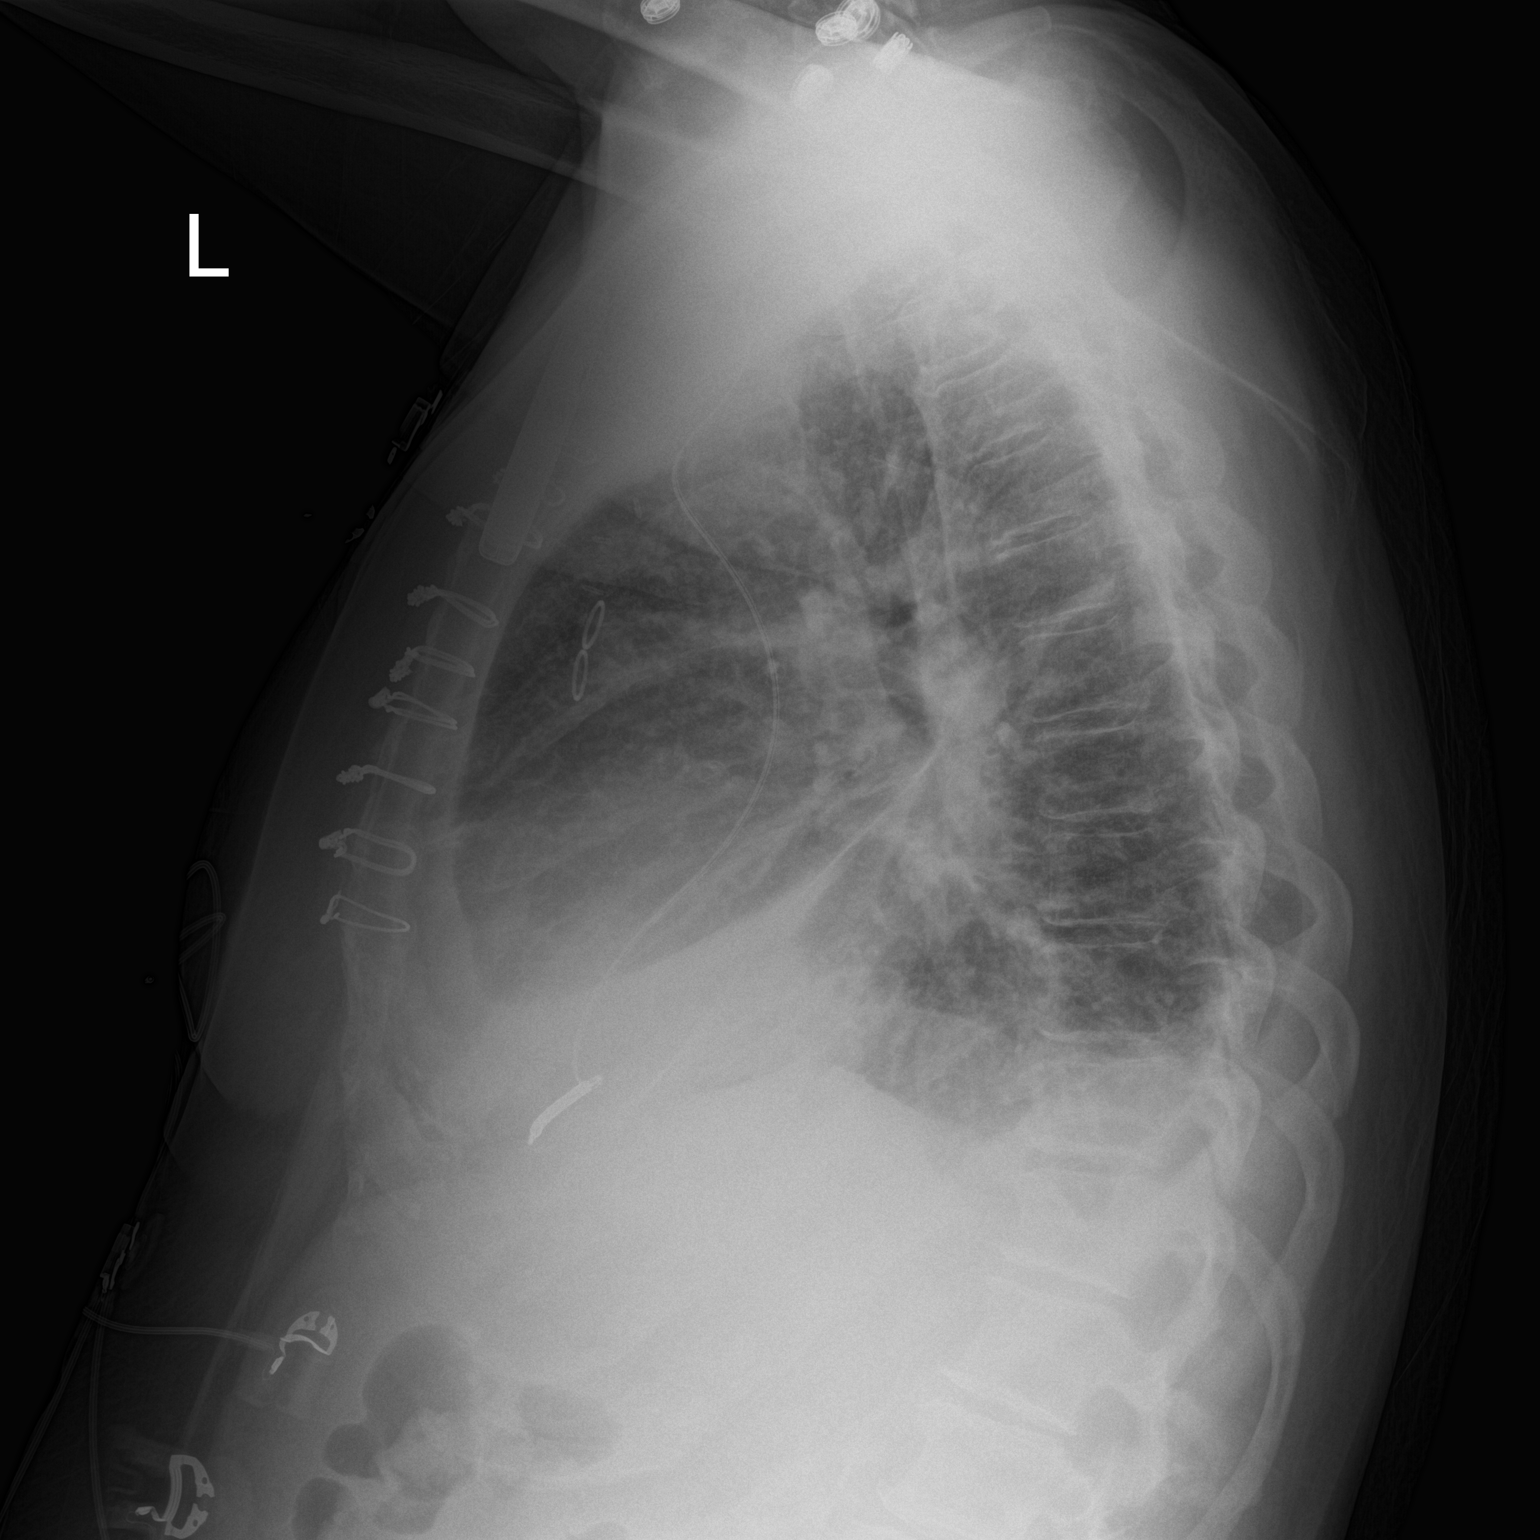

[2 of 2 positions shown; findings below may reference images not displayed]

FINDINGS: Stable left subclavian AICD device. Stable pulmonary edema.
Bilateral pleural effusions right greater than left. No
pneumothorax.
IMPRESSION: Stable CHF and bilateral pleural effusions right greater than left.

## 2016-11-18 ENCOUNTER — Inpatient Hospital Stay (HOSPITAL_COMMUNITY): Admission: RE | Admit: 2016-11-18 | Payer: Medicare Other | Source: Ambulatory Visit

## 2016-11-18 NOTE — Telephone Encounter (Signed)
Opened in Error.

## 2016-11-20 ENCOUNTER — Other Ambulatory Visit (HOSPITAL_COMMUNITY): Payer: Self-pay

## 2016-11-20 NOTE — Progress Notes (Signed)
Paramedicine Encounter    Patient ID: Jacob [name], male    DOB: [birth year], 61 y.o.   MRN: [id number]    Patient Care Team: [name], MD as PCP - General (Internal Medicine) Clarisa Schools, RN as Registered Nurse Jena Gauss, Gerrit Friends, MD as Consulting Physician (Gastroenterology)  Patient Active Problem List   Diagnosis Date Noted  . Congestive heart disease (HCC) 01/01/2016  . Diabetic neuropathy (HCC) 11/28/2015  . Ascites 11/09/2015  . CHF (congestive heart failure) (HCC) 11/09/2015  . Insomnia 04/18/2015  . Claudication of right lower extremity (HCC) 04/07/2015  . Cardiomyopathy, ischemic 04/07/2015  . Chronic anticoagulation 03/30/2015  . Cardiorenal syndrome with renal failure   . SOB (shortness of breath)   . Morbid obesity due to excess calories (HCC)   . Helicobacter pylori ab+ 12/12/2014  . Calf pain   . Duodenal ulcer with hemorrhage   . Hemorrhagic shock (HCC)   . Cardiogenic shock (HCC)   . Coronary artery disease involving native coronary artery of native heart without angina pectoris   . Atrial fibrillation with rapid ventricular response (HCC) 10/29/2014  . Chest pain   . Atherosclerosis of native arteries of extremity with intermittent claudication (HCC) 09/06/2014  . Atrial fibrillation with controlled ventricular response (HCC) 08/03/2014  . Bacteremia   . DM (diabetes mellitus), type 2 with peripheral vascular complications (HCC) 08/01/2014  . Anemia of chronic disease 08/01/2014  . PVD (peripheral vascular disease) (HCC) 10/29/2013  . Tobacco abuse 05/13/2013  . AF (atrial fibrillation) (HCC) 02/11/2013  . ICD - in place- BS May 2014 [facility] 02/11/2013  . Microcytic anemia 01/11/2013  . 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 Noncompliance 12/01/2011  . Essential hypertension 10/30/2006    Current Outpatient Prescriptions:  .  ACCU-CHEK SOFTCLIX LANCETS lancets, Use for once daily testing of blood sugar, Disp: 100 each, Rfl:  12 .  albuterol (PROVENTIL HFA;VENTOLIN HFA) 108 (90 Base) MCG/ACT inhaler, Inhale 2 puffs into the lungs every 6 (six) hours as needed for wheezing or shortness of breath., Disp: 1 Inhaler, Rfl: 2 .  albuterol (PROVENTIL) (2.5 MG/3ML) 0.083% nebulizer solution, Take 3 mLs (2.5 mg total) by nebulization every 6 (six) hours as needed for wheezing or shortness of breath., Disp: 150 mL, Rfl: 1 .  amiodarone (PACERONE) 200 MG tablet, take 1 TABLET BY MOUTH EVERY DAY every morning, Disp: 30 tablet, Rfl: 3 .  atorvastatin (LIPITOR) 40 MG tablet, Take 1 tablet (40 mg total) by mouth daily at 6 PM., Disp: 30 tablet, Rfl: 5 .  Blood Glucose Monitoring Suppl (ACCU-CHEK AVIVA) device, Use as instructed1 times daily before meals, Disp: 1 each, Rfl: 0 .  cetirizine (ZYRTEC) 10 MG tablet, Take 1 tablet (10 mg total) by mouth daily., Disp: 30 tablet, Rfl: 1 .  digoxin (LANOXIN) 0.125 MG tablet, Take 0.5 tablets (0.0625 mg total) by mouth daily., Disp: 14 tablet, Rfl: 2 .  ferrous sulfate 325 (65 FE) MG tablet, TAKE 1 TABLET BY MOUTH 3 TIMES DAILY WITH MEALS., Disp: 90 tablet, Rfl: 2 .  gabapentin (NEURONTIN) 300 MG capsule, Take 1 capsule (300 mg total) by mouth 2 (two) times daily., Disp: 60 capsule, Rfl: 5 .  glipiZIDE (GLUCOTROL) 10 MG tablet, TAKE 1 TABLET BY MOUTH 2 TIMES DAILY BEFORE A MEAL., Disp: 60 tablet, Rfl: 5 .  glucose blood (ACCU-CHEK AVIVA) test strip, Use as instructed for 1 times daily testing of blood sugar, Disp: 100 each, Rfl: 12 .  hydrALAZINE (  APRESOLINE) 25 MG tablet, take 1 TABLET BY MOUTH THREE TIMES DAILY every morning, noon,evening, Disp: 90 tablet, Rfl: 3 .  Insulin Glargine (LANTUS) 100 UNIT/ML Solostar Pen, Inject 25 Units into the skin daily at 10 pm., Disp: 4 pen, Rfl: 5 .  Insulin Pen Needle 31G X 5 MM MISC, Use as directed for once daily insulin injection, Disp: 100 each, Rfl: 2 .  isosorbide mononitrate (IMDUR) 30 MG 24 hr tablet, Take 1 tablet (30 mg total) by mouth daily., Disp:  30 tablet, Rfl: 3 .  lactulose (CHRONULAC) 10 GM/15ML solution, Take 15 mLs (10 g total) by mouth 2 (two) times daily. (Patient not taking: Reported on 10/15/2016), Disp: 946 mL, Rfl: 1 .  Lancet Devices (ACCU-CHEK SOFTCLIX) lancets, Use as instructed for once daily testing of blood sugar, Disp: 1 each, Rfl: 0 .  meclizine (ANTIVERT) 25 MG tablet, Take 25 mg by mouth 2 (two) times daily as needed for nausea., Disp: , Rfl:  .  metolazone (ZAROXOLYN) 2.5 MG tablet, Take 1 tablet once as directed by CHF clinic., Disp: 3 tablet, Rfl: 0 .  potassium chloride SA (KLOR-CON M20) 20 MEQ tablet, Take 40 meq (2 tabs) once today and once tomorrow., Disp: 4 tablet, Rfl: 0 .  spironolactone (ALDACTONE) 25 MG tablet, TAKE 1 TABLET BY MOUTH DAILY., Disp: 90 tablet, Rfl: 4 .  torsemide (DEMADEX) 20 MG tablet, Take 4 tablets (80 mg total) by mouth 2 (two) times daily., Disp: 240 tablet, Rfl: 3 .  traMADol (ULTRAM) 50 MG tablet, Take 1 tablet (50 mg total) by mouth every 12 (twelve) hours as needed., Disp: 30 tablet, Rfl: 0 .  XARELTO 20 MG TABS tablet, take 1 TABLET BY MOUTH before supper in the evening, Disp: 28 tablet, Rfl: 2 Allergies  Allergen Reactions  . Tylenol [Acetaminophen] Itching  . Tape Itching    Paper tape please.     Social History   Social History  . Marital status: Divorced    Spouse name: N/A  . Number of children: 3  . Years of education: N/A   Occupational History  . disabled    Social History Main Topics  . Smoking status: Current Every Day Smoker    Packs/day: 0.50    Years: 40.00    Types: Cigarettes  . Smokeless tobacco: Never Used  . Alcohol use No  . Drug use: No  . Sexual activity: No   Other Topics Concern  . Not on file   Social History Narrative   Has an apartment with a roommate. He was living on the streets in 01-23-13.  He reports that his father died in [place] in 01/23/2013.  He is divorced.  He is no longer estranged from his son, but still from his  daughter who lives locally.  Neither of his parents, nor any siblings have any history of CAD.    Physical Exam  Pulmonary/Chest: No respiratory distress. He has no wheezes. He has no rales.  Abdominal: He exhibits no distension. There is no tenderness. There is no rebound and no guarding.  Skin: Skin is warm and dry. He is not diaphoretic.        Future Appointments Date Time Provider Department Center  02/11/2017 3:00 PM Jaclyn Shaggy, MD [facility] None    ATF pt CAO x4 sitting on the edge of his bed c/o right knee pain.  Pt stated this pain has been constant for about 3 weeks. Last week his PCP gave him a prescription  for tramadol which he stated doesn't work.  Pt was advised to take a metolazone last week due to weight gain; he didn't. I gave him his am meds which included metolazone this morning. I advised him that he will be urinating a lot throughout the day.  Pt stated that he forgot to take the metolazone last week.  Pt denies sob, dizziness, lightheadedness and chest pain. Pt is still receiving pill packs and hes taking them as advised.  Pill pack verified. I will f/u with pt about weight on Friday.  REDS reading 35% cbg 151 BP 112/76 (BP Location: Right Arm, Patient Position: Sitting, Cuff Size: Normal)   Pulse (!) 105   Resp 12   Wt 239 lb (108.4 kg)   SpO2 98%   BMI 39.77 kg/m   Weight yesterday-237 Last visit weight-243    Girtrude Enslin, EMT Paramedic 11/20/2016    ACTION: Home visit completed

## 2016-11-26 ENCOUNTER — Telehealth: Payer: Self-pay

## 2016-11-26 NOTE — Telephone Encounter (Signed)
Pt was called and informed of lab results via interpreter. 

## 2016-11-28 IMAGING — CR DG CHEST 1V PORT
1 series · 1 of 1 positions shown · non-contrast
Comparison: 11/23/2014 at 0996 hr

CLINICAL DATA: Acute respiratory distress.

EXAM:
PORTABLE CHEST - 1 VIEW

[AP]
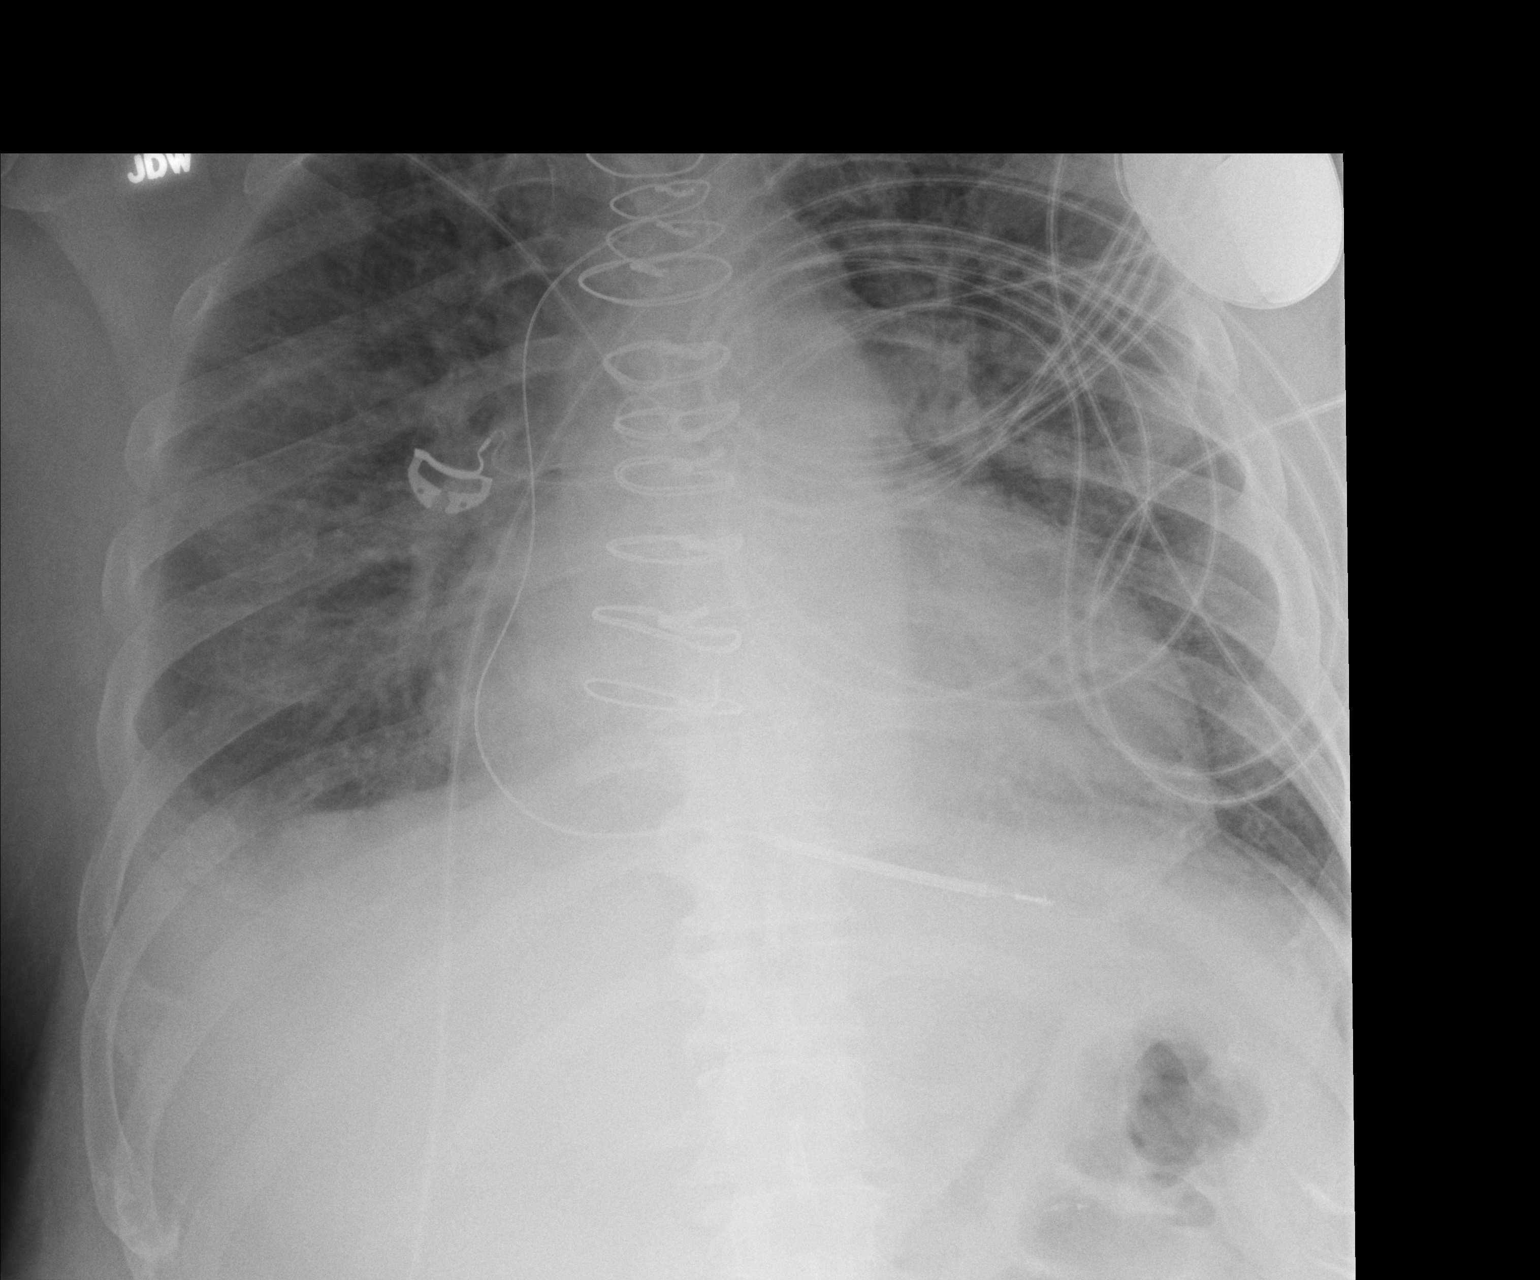

[1 of 1 positions shown; findings below may reference images not displayed]

FINDINGS: The heart is enlarged but stable. Stable surgical changes from
bypass surgery. Persistent pulmonary edema, pleural effusions and
atelectasis. There is however slight improved aeration which may
reflect slightly less edema.
IMPRESSION: Persistent edema and right pleural effusion but slight improved
aeration

## 2016-11-29 ENCOUNTER — Other Ambulatory Visit: Payer: Self-pay | Admitting: Family Medicine

## 2016-11-29 DIAGNOSIS — E1149 Type 2 diabetes mellitus with other diabetic neurological complication: Secondary | ICD-10-CM

## 2016-12-02 ENCOUNTER — Ambulatory Visit: Payer: Medicare Other | Attending: Internal Medicine | Admitting: Internal Medicine

## 2016-12-02 VITALS — BP 133/81 | HR 94 | Temp 98.3°F | Resp 20 | Wt 244.2 lb

## 2016-12-02 DIAGNOSIS — M25561 Pain in right knee: Secondary | ICD-10-CM | POA: Diagnosis not present

## 2016-12-02 DIAGNOSIS — E7800 Pure hypercholesterolemia, unspecified: Secondary | ICD-10-CM | POA: Insufficient documentation

## 2016-12-02 DIAGNOSIS — I482 Chronic atrial fibrillation: Secondary | ICD-10-CM | POA: Insufficient documentation

## 2016-12-02 DIAGNOSIS — M79604 Pain in right leg: Secondary | ICD-10-CM | POA: Diagnosis not present

## 2016-12-02 DIAGNOSIS — R739 Hyperglycemia, unspecified: Secondary | ICD-10-CM

## 2016-12-02 DIAGNOSIS — I2510 Atherosclerotic heart disease of native coronary artery without angina pectoris: Secondary | ICD-10-CM | POA: Insufficient documentation

## 2016-12-02 DIAGNOSIS — I252 Old myocardial infarction: Secondary | ICD-10-CM | POA: Diagnosis not present

## 2016-12-02 DIAGNOSIS — I739 Peripheral vascular disease, unspecified: Secondary | ICD-10-CM | POA: Diagnosis not present

## 2016-12-02 DIAGNOSIS — Z7901 Long term (current) use of anticoagulants: Secondary | ICD-10-CM | POA: Insufficient documentation

## 2016-12-02 DIAGNOSIS — Z95810 Presence of automatic (implantable) cardiac defibrillator: Secondary | ICD-10-CM | POA: Insufficient documentation

## 2016-12-02 DIAGNOSIS — Z951 Presence of aortocoronary bypass graft: Secondary | ICD-10-CM | POA: Insufficient documentation

## 2016-12-02 DIAGNOSIS — I110 Hypertensive heart disease with heart failure: Secondary | ICD-10-CM | POA: Insufficient documentation

## 2016-12-02 DIAGNOSIS — I779 Disorder of arteries and arterioles, unspecified: Secondary | ICD-10-CM

## 2016-12-02 DIAGNOSIS — M1711 Unilateral primary osteoarthritis, right knee: Secondary | ICD-10-CM | POA: Insufficient documentation

## 2016-12-02 DIAGNOSIS — E1151 Type 2 diabetes mellitus with diabetic peripheral angiopathy without gangrene: Secondary | ICD-10-CM | POA: Diagnosis not present

## 2016-12-02 DIAGNOSIS — E1159 Type 2 diabetes mellitus with other circulatory complications: Secondary | ICD-10-CM | POA: Diagnosis not present

## 2016-12-02 DIAGNOSIS — I5042 Chronic combined systolic (congestive) and diastolic (congestive) heart failure: Secondary | ICD-10-CM | POA: Insufficient documentation

## 2016-12-02 DIAGNOSIS — F17210 Nicotine dependence, cigarettes, uncomplicated: Secondary | ICD-10-CM | POA: Insufficient documentation

## 2016-12-02 DIAGNOSIS — Z794 Long term (current) use of insulin: Secondary | ICD-10-CM | POA: Insufficient documentation

## 2016-12-02 LAB — POCT CBG (FASTING - GLUCOSE)-MANUAL ENTRY
GLUCOSE FASTING, POC: 313 mg/dL — AB (ref 70–99)
Glucose Fasting, POC: 383 mg/dL — AB (ref 70–99)

## 2016-12-02 MED ORDER — ACETAMINOPHEN-CODEINE #4 300-60 MG PO TABS: 1.0000 | ORAL_TABLET | ORAL | 0 refills | Status: DC | PRN

## 2016-12-02 NOTE — Patient Instructions (Signed)
Diabetes Mellitus and Exercise Exercising regularly is important for your overall health, especially when you have diabetes (diabetes mellitus). Exercising is not only about losing weight. It has many health benefits, such as increasing muscle strength and bone density and reducing body fat and stress. This leads to improved fitness, flexibility, and endurance, all of which result in better overall health. Exercise has additional benefits for people with diabetes, including:  Reducing appetite.  Helping to lower and control blood glucose.  Lowering blood pressure.  Helping to control amounts of fatty substances (lipids) in the blood, such as cholesterol and triglycerides.  Helping the body to respond better to insulin (improving insulin sensitivity).  Reducing how much insulin the body needs.  Decreasing the risk for heart disease by: ? Lowering cholesterol and triglyceride levels. ? Increasing the levels of good cholesterol. ? Lowering blood glucose levels.  What is my activity plan? Your health care provider or certified diabetes educator can help you make a plan for the type and frequency of exercise (activity plan) that works for you. Make sure that you:  Do at least 150 minutes of moderate-intensity or vigorous-intensity exercise each week. This could be brisk walking, biking, or water aerobics. ? Do stretching and strength exercises, such as yoga or weightlifting, at least 2 times a week. ? Spread out your activity over at least 3 days of the week.  Get some form of physical activity every day. ? Do not go more than 2 days in a row without some kind of physical activity. ? Avoid being inactive for more than 90 minutes at a time. Take frequent breaks to walk or stretch.  Choose a type of exercise or activity that you enjoy, and set realistic goals.  Start slowly, and gradually increase the intensity of your exercise over time.  What do I need to know about managing my  diabetes?  Check your blood glucose before and after exercising. ? If your blood glucose is higher than 240 mg/dL (13.3 mmol/L) before you exercise, check your urine for ketones. If you have ketones in your urine, do not exercise until your blood glucose returns to normal.  Know the symptoms of low blood glucose (hypoglycemia) and how to treat it. Your risk for hypoglycemia increases during and after exercise. Common symptoms of hypoglycemia can include: ? Hunger. ? Anxiety. ? Sweating and feeling clammy. ? Confusion. ? Dizziness or feeling light-headed. ? Increased heart rate or palpitations. ? Blurry vision. ? Tingling or numbness around the mouth, lips, or tongue. ? Tremors or shakes. ? Irritability.  Keep a rapid-acting carbohydrate snack available before, during, and after exercise to help prevent or treat hypoglycemia.  Avoid injecting insulin into areas of the body that are going to be exercised. For example, avoid injecting insulin into: ? The arms, when playing tennis. ? The legs, when jogging.  Keep records of your exercise habits. Doing this can help you and your health care provider adjust your diabetes management plan as needed. Write down: ? Food that you eat before and after you exercise. ? Blood glucose levels before and after you exercise. ? The type and amount of exercise you have done. ? When your insulin is expected to peak, if you use insulin. Avoid exercising at times when your insulin is peaking.  When you start a new exercise or activity, work with your health care provider to make sure the activity is safe for you, and to adjust your insulin, medicines, or food intake as needed.    Drink plenty of water while you exercise to prevent dehydration or heat stroke. Drink enough fluid to keep your urine clear or pale yellow. This information is not intended to replace advice given to you by your health care provider. Make sure you discuss any questions you have with  your health care provider. Document Released: 06/01/2003 Document Revised: 09/29/2015 Document Reviewed: 08/21/2015 Elsevier Interactive Patient Education  2018 [employer] Blood Glucose Monitoring, Adult Monitoring your blood sugar (glucose) helps you manage your diabetes. It also helps you and your health care provider determine how well your diabetes management plan is working. Blood glucose monitoring involves checking your blood glucose as often as directed, and keeping a record (log) of your results over time. Why should I monitor my blood glucose? Checking your blood glucose regularly can:  Help you understand how food, exercise, illnesses, and medicines affect your blood glucose.  Let you know what your blood glucose is at any time. You can quickly tell if you are having low blood glucose (hypoglycemia) or high blood glucose (hyperglycemia).  Help you and your health care provider adjust your medicines as needed.  When should I check my blood glucose? Follow instructions from your health care provider about how often to check your blood glucose. This may depend on:  The type of diabetes you have.  How well-controlled your diabetes is.  Medicines you are taking.  If you have type 1 diabetes:  Check your blood glucose at least 2 times a day.  Also check your blood glucose: ? Before every insulin injection. ? Before and after exercise. ? Between meals. ? 2 hours after a meal. ? Occasionally between 2:00 a.m. and 3:00 a.m., as directed. ? Before potentially dangerous tasks, like driving or using heavy machinery. ? At bedtime.  You may need to check your blood glucose more often, up to 6-10 times a day: ? If you use an insulin pump. ? If you need multiple daily injections (MDI). ? If your diabetes is not well-controlled. ? If you are ill. ? If you have a history of severe hypoglycemia. ? If you have a history of not knowing when your blood glucose is getting low  (hypoglycemia unawareness). If you have type 2 diabetes:  If you take insulin or other diabetes medicines, check your blood glucose at least 2 times a day.  If you are on intensive insulin therapy, check your blood glucose at least 4 times a day. Occasionally, you may also need to check between 2:00 a.m. and 3:00 a.m., as directed.  Also check your blood glucose: ? Before and after exercise. ? Before potentially dangerous tasks, like driving or using heavy machinery.  You may need to check your blood glucose more often if: ? Your medicine is being adjusted. ? Your diabetes is not well-controlled. ? You are ill. What is a blood glucose log?  A blood glucose log is a record of your blood glucose readings. It helps you and your health care provider: ? Look for patterns in your blood glucose over time. ? Adjust your diabetes management plan as needed.  Every time you check your blood glucose, write down your result and notes about things that may be affecting your blood glucose, such as your diet and exercise for the day.  Most glucose meters store a record of glucose readings in the meter. Some meters allow you to download your records to a computer. How do I check my blood glucose? Follow these steps to get accurate   readings of your blood glucose: Supplies needed   Blood glucose meter.  Test strips for your meter. Each meter has its own strips. You must use the strips that come with your meter.  A needle to prick your finger (lancet). Do not use lancets more than once.  A device that holds the lancet (lancing device).  A journal or log book to write down your results. Procedure  Wash your hands with soap and water.  Prick the side of your finger (not the tip) with the lancet. Use a different finger each time.  Gently rub the finger until a small drop of blood appears.  Follow instructions that come with your meter for inserting the test strip, applying blood to the strip,  and using your blood glucose meter.  Write down your result and any notes. Alternative testing sites  Some meters allow you to use areas of your body other than your finger (alternative sites) to test your blood.  If you think you may have hypoglycemia, or if you have hypoglycemia unawareness, do not use alternative sites. Use your finger instead.  Alternative sites may not be as accurate as the fingers, because blood flow is slower in these areas. This means that the result you get may be delayed, and it may be different from the result that you would get from your finger.  The most common alternative sites are: ? Forearm. ? Thigh. ? Palm of the hand. Additional tips  Always keep your supplies with you.  If you have questions or need help, all blood glucose meters have a 24-hour "hotline" number that you can call. You may also contact your health care provider.  After you use a few boxes of test strips, adjust (calibrate) your blood glucose meter by following instructions that came with your meter. This information is not intended to replace advice given to you by your health care provider. Make sure you discuss any questions you have with your health care provider. Document Released: 03/14/2003 Document Revised: 09/29/2015 Document Reviewed: 08/21/2015 Elsevier Interactive Patient Education  2017 [employer] Inc[employer]  

## 2016-12-02 NOTE — Telephone Encounter (Signed)
[name] with [facility] made aware

## 2016-12-02 NOTE — Progress Notes (Signed)
Knee pain x 3 weeks

## 2016-12-02 NOTE — Progress Notes (Signed)
Jacob [name], is a 61 y.o. male  VEL:[id number]  [id number]  DOB - [birth year]  No chief complaint on file.      Subjective:   Jacob [name] is a 61 y.o. 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 Patient continues to smoke heavily and he says he is not ready to [place], he has smoked 1PPD for over 40 years. He is complaining of ongoing intractable right knee pain from OA. He said he fell 4-5 weeks ago and probably injured his right knee but no fracture or dislocation. He has claudication pain in the calf of his right leg and is followed by vascular - Dr Allyson Sabal. Vascular ultrasound of the aorta/IVC/iliacs performed in 06/2016 revealed normal velocities in the right common iliac, bilateral external iliac; left common iliac, IVC were not seen due to abdominal girth. ABI today shows poor pedal pulses bilaterally.   Problem  Hyperglycemia  Acute Pain of Right Knee  Diabetes Mellitus (Hcc)    ALLERGIES: Allergies  Allergen Reactions  . Tape Itching    Paper tape please.    PAST MEDICAL HISTORY: Past Medical History:  Diagnosis Date  . Atrial fibrillation (HCC)    RVR 10/2014  . Automatic implantable cardioverter-defibrillator in situ   . CAD (coronary artery disease) Sept 2013   s/p cardiac cath showing occlusion of small RCA with collaterals  . CHF (congestive heart failure) (HCC)    20 to 25 % EF and RV dysfunction by 07/2014 echo   . Chronic anticoagulation    on xarelto.   . High cholesterol   . Hypertension   . Myocardial infarction (HCC) 2014  . Noncompliance    homelessness contributing.   . Peripheral arterial disease (HCC)   . Type II diabetes mellitus (HCC)      MEDICATIONS AT HOME: Prior to Admission medications   Medication Sig Start Date End Date Taking? Authorizing Provider  albuterol (PROVENTIL HFA;VENTOLIN HFA) 108 (90 Base) MCG/ACT inhaler Inhale 2 puffs into the lungs every 6 (six) hours as needed for wheezing or shortness of breath. 03/29/15  Yes Jaclyn Shaggy, MD  albuterol (PROVENTIL) (2.5 MG/3ML) 0.083% nebulizer solution Take 3 mLs (2.5 mg total) by nebulization every 6 (six) hours as needed for wheezing or shortness of breath. 04/18/15  Yes Jaclyn Shaggy, MD  amiodarone (PACERONE) 200 MG tablet take 1 TABLET BY MOUTH EVERY DAY every morning 11/11/16  Yes Laurey Morale, MD  atorvastatin (LIPITOR) 40 MG tablet Take 1 tablet (40 mg total) by mouth daily at 6 PM. 11/11/16  Yes Jaclyn Shaggy, MD  Blood Glucose Monitoring Suppl (ACCU-CHEK AVIVA) device Use as instructed1 times daily before meals 11/28/15  Yes Amao, Odette Horns, MD  cetirizine (ZYRTEC) 10 MG tablet Take 1 tablet (10 mg total) by mouth daily. 11/11/16  Yes Jaclyn Shaggy, MD  digoxin (LANOXIN) 0.125 MG tablet Take 0.5 tablets (0.0625 mg total) by mouth daily. 11/12/16  Yes Bensimhon, Bevelyn Buckles, MD  ferrous sulfate 325 (65 FE) MG tablet TAKE 1 TABLET BY MOUTH 3 TIMES DAILY WITH MEALS. 05/22/16  Yes Ciana Simmon E, MD  gabapentin (NEURONTIN) 300 MG capsule Take 1 capsule (300 mg total) by mouth 2 (two) times daily. 11/11/16  Yes Amao, Enobong, MD  glipiZIDE (GLUCOTROL) 10 MG tablet TAKE 1 TABLET BY MOUTH 2 TIMES DAILY BEFORE A MEAL. 11/11/16  Yes Jaclyn Shaggy, MD  glucose blood (ACCU-CHEK AVIVA) test strip Use as instructed for 1 times daily testing of blood sugar 11/28/15  Yes Amao, Odette Horns, MD  hydrALAZINE (APRESOLINE) 25 MG tablet take 1 TABLET BY MOUTH THREE TIMES DAILY every morning, noon,evening 11/11/16  Yes Laurey Morale, MD  Insulin Glargine (LANTUS) 100 UNIT/ML Solostar Pen Inject 25 Units into the skin daily at 10 pm. 11/11/16  Yes Jaclyn Shaggy, MD  Insulin Pen Needle 31G X 5 MM  MISC Use as directed for once daily insulin injection 11/11/16  Yes Amao, Odette Horns, MD  isosorbide mononitrate (IMDUR) 30 MG 24 hr tablet Take 1 tablet (30 mg total) by mouth daily. 09/18/16  Yes Bensimhon, Bevelyn Buckles, MD  Lancet Devices [facility]) lancets Use as instructed for once daily testing of blood sugar 11/28/15  Yes Jaclyn Shaggy, MD  meclizine (ANTIVERT) 25 MG tablet Take 25 mg by mouth 2 (two) times daily as needed for nausea.   Yes [provider]  metolazone (ZAROXOLYN) 2.5 MG tablet Take 1 tablet once as directed by CHF clinic. 11/13/16  Yes Bensimhon, Bevelyn Buckles, MD  potassium chloride SA (KLOR-CON M20) 20 MEQ tablet Take 40 meq (2 tabs) once today and once tomorrow. 10/09/16  Yes Little Ishikawa, NP  spironolactone (ALDACTONE) 25 MG tablet TAKE 1 TABLET BY MOUTH DAILY. 09/03/16  Yes Bensimhon, Bevelyn Buckles, MD  torsemide (DEMADEX) 20 MG tablet Take 4 tablets (80 mg total) by mouth 2 (two) times daily. 09/26/16  Yes Little Ishikawa, NP  XARELTO 20 MG TABS tablet take 1 TABLET BY MOUTH before supper in the evening 11/12/16  Yes Bensimhon, Bevelyn Buckles, MD  ACCU-CHEK [facility] LANCETS lancets Use for once daily testing of blood sugar 11/28/15   Jaclyn Shaggy, MD  acetaminophen-codeine (TYLENOL #4) 300-60 MG tablet Take 1 tablet by mouth every 4 (four) hours as needed for moderate pain. 12/02/16   [name], MD  lactulose (CHRONULAC) 10 GM/15ML solution Take 15 mLs (10 g total) by mouth 2 (two) times daily. Patient not taking: Reported on 10/15/2016 08/09/16   Jaclyn Shaggy, MD    Objective:   Vitals:   12/02/16 1353  BP: 133/81  Pulse: 94  Resp: 20  Temp: 98.3 F (36.8 C)  TempSrc: Oral  SpO2: 96%  Weight: 244 lb 3.2 oz (110.8 kg)   Exam General appearance : Awake, alert, not in any distress. Speech Clear. Not toxic looking, obese, cigarette stench  HEENT: Atraumatic and Normocephalic, pupils equally reactive to light and accomodation Neck: Supple, no JVD. No cervical  lymphadenopathy.  Chest: Good air entry bilaterally, no added sounds  CVS: S1 S2 regular, no murmurs.  Abdomen: Bowel sounds present, Non tender and not distended with no gaurding, rigidity or rebound. Extremities: weak pedal pulses bulaterally, B/L Lower Ext shows no edema, both legs are warm to touch Neurology: Awake alert, and oriented X 3, CN II-XII intact, Non focal Skin: No Rash  Data Review Lab Results  Component Value Date   HGBA1C 10.4 11/11/2016   HGBA1C 7.9 06/06/2016   HGBA1C 7.2 03/14/2016    Assessment & Plan   1. Type 2 diabetes mellitus with other circulatory complication, without long-term current use of insulin (HCC)  - Glucose (CBG), Fasting  Aim for 30 minutes of exercise most days. Rethink what you drink. Water is great!  Aim for 2-3 Carb Choices per meal (30-45 grams) +/- 1 either way  Aim for 0-15 Carbs per snack if hungry  Include protein in moderation with your meals and snacks  Consider reading food labels for Total Carbohydrate and Fat Grams of foods  Consider checking BG at alternate times per day  Continue taking medication as directed Be mindful about how much sugar you are adding to beverages and other foods. Fruit Punch - find one with no sugar  Measure and decrease portions of carbohydrate foods  Make your plate and don't go back for seconds  2. Acute pain of right knee  - X Ray Right Knee Joint - acetaminophen-codeine (TYLENOL #4) 300-60 MG tablet; Take 1 tablet by mouth every 4 (four) hours as needed for moderate pain.  Dispense: 60 tablet; Refill: 0  3. PVD (peripheral vascular disease) (HCC)  - Patient follows up with Dr. Allyson Sabal at [facility].  - Patient continue to smoke 1PPD, saying he cannot quit, despite repeated counseling  Patient have been counseled extensively about nutrition and exercise. Other issues discussed during this visit include: low cholesterol diet, weight control and daily exercise, foot care, annual eye examinations at  Ophthalmology, importance of adherence with medications and regular follow-up. We also discussed long term complications of uncontrolled diabetes and hypertension.   Return in about 3 months (around 03/03/2017) for Hemoglobin A1C and Follow up, DM, Follow up Pain and comorbidities, Follow up HTN.  The patient was given clear instructions to go to ER or return to medical center if symptoms don't improve, worsen or new problems develop. The patient verbalized understanding. The patient was told to call to get lab results if they haven't heard anything in the next week.   This note has been created with Education officer, environmental. Any transcriptional errors are unintentional.    [name], MD, MHA, FACP, FAAP, CPE [facility] [place], [place] [phone]   12/02/2016, 3:02 PM

## 2016-12-04 ENCOUNTER — Other Ambulatory Visit (HOSPITAL_COMMUNITY): Payer: Self-pay

## 2016-12-04 NOTE — Addendum Note (Signed)
Addended by: Margaretmary Lombard on: 12/04/2016 09:57 AM   Modules accepted: Orders

## 2016-12-04 NOTE — Progress Notes (Signed)
Paramedicine Encounter    Patient ID: Jacob [name], male    DOB: November 30, 1955, 61 y.o.   MRN: [id number]    Patient Care Team: [name], MD as PCP - General (Internal Medicine) Clarisa Schools, RN as Registered Nurse Jena Gauss, Gerrit Friends, MD as Consulting Physician (Gastroenterology)  Patient Active Problem List   Diagnosis Date Noted  . Hyperglycemia 12/02/2016  . Acute pain of right knee 12/02/2016  . Congestive heart disease (HCC) 01/01/2016  . Diabetic neuropathy (HCC) 11/28/2015  . Ascites 11/09/2015  . CHF (congestive heart failure) (HCC) 11/09/2015  . Insomnia 04/18/2015  . Claudication of right lower extremity (HCC) 04/07/2015  . Cardiomyopathy, ischemic 04/07/2015  . Chronic anticoagulation 03/30/2015  . Cardiorenal syndrome with renal failure   . SOB (shortness of breath)   . Morbid obesity due to excess calories (HCC)   . Helicobacter pylori ab+ 12/12/2014  . Calf pain   . Duodenal ulcer with hemorrhage   . Hemorrhagic shock (HCC)   . Cardiogenic shock (HCC)   . Diabetes mellitus (HCC)   . Coronary artery disease involving native coronary artery of native heart without angina pectoris   . Atrial fibrillation with rapid ventricular response (HCC) 10/29/2014  . Chest pain   . Atherosclerosis of native arteries of extremity with intermittent claudication (HCC) 09/06/2014  . Atrial fibrillation with controlled ventricular response (HCC) 08/03/2014  . Bacteremia   . DM (diabetes mellitus), type 2 with peripheral vascular complications (HCC) 08/01/2014  . Anemia of chronic disease 08/01/2014  . PVD (peripheral vascular disease) (HCC) 10/29/2013  . Tobacco abuse 05/13/2013  . AF (atrial fibrillation) (HCC) 02/11/2013  . ICD - in place- BS May 2014 [facility] 02/11/2013  . Microcytic anemia 01/11/2013  . CAD- s/p CABG July 2014 [facility] 12/04/2011  . Noncompliance 12/01/2011  . Essential hypertension 10/30/2006    Current Outpatient Prescriptions:  .   ACCU-CHEK SOFTCLIX LANCETS lancets, Use for once daily testing of blood sugar, Disp: 100 each, Rfl: 12 .  acetaminophen-codeine (TYLENOL #4) 300-60 MG tablet, Take 1 tablet by mouth every 4 (four) hours as needed for moderate pain., Disp: 60 tablet, Rfl: 0 .  albuterol (PROVENTIL HFA;VENTOLIN HFA) 108 (90 Base) MCG/ACT inhaler, Inhale 2 puffs into the lungs every 6 (six) hours as needed for wheezing or shortness of breath., Disp: 1 Inhaler, Rfl: 2 .  albuterol (PROVENTIL) (2.5 MG/3ML) 0.083% nebulizer solution, Take 3 mLs (2.5 mg total) by nebulization every 6 (six) hours as needed for wheezing or shortness of breath., Disp: 150 mL, Rfl: 1 .  amiodarone (PACERONE) 200 MG tablet, take 1 TABLET BY MOUTH EVERY DAY every morning, Disp: 30 tablet, Rfl: 3 .  atorvastatin (LIPITOR) 40 MG tablet, Take 1 tablet (40 mg total) by mouth daily at 6 PM., Disp: 30 tablet, Rfl: 5 .  Blood Glucose Monitoring Suppl (ACCU-CHEK AVIVA) device, Use as instructed1 times daily before meals, Disp: 1 each, Rfl: 0 .  cetirizine (ZYRTEC) 10 MG tablet, Take 1 tablet (10 mg total) by mouth daily., Disp: 30 tablet, Rfl: 1 .  digoxin (LANOXIN) 0.125 MG tablet, Take 0.5 tablets (0.0625 mg total) by mouth daily., Disp: 14 tablet, Rfl: 2 .  ferrous sulfate 325 (65 FE) MG tablet, TAKE 1 TABLET BY MOUTH 3 TIMES DAILY WITH MEALS., Disp: 90 tablet, Rfl: 2 .  gabapentin (NEURONTIN) 300 MG capsule, Take 1 capsule (300 mg total) by mouth 2 (two) times daily., Disp: 60 capsule, Rfl: 5 .  glipiZIDE (GLUCOTROL) 10  MG tablet, TAKE 1 TABLET BY MOUTH 2 TIMES DAILY BEFORE A MEAL., Disp: 60 tablet, Rfl: 5 .  glucose blood (ACCU-CHEK AVIVA) test strip, Use as instructed for 1 times daily testing of blood sugar, Disp: 100 each, Rfl: 12 .  hydrALAZINE (APRESOLINE) 25 MG tablet, take 1 TABLET BY MOUTH THREE TIMES DAILY every morning, noon,evening, Disp: 90 tablet, Rfl: 3 .  Insulin Glargine (LANTUS) 100 UNIT/ML Solostar Pen, Inject 25 Units into the skin  daily at 10 pm., Disp: 4 pen, Rfl: 5 .  Insulin Pen Needle 31G X 5 MM MISC, Use as directed for once daily insulin injection, Disp: 100 each, Rfl: 2 .  isosorbide mononitrate (IMDUR) 30 MG 24 hr tablet, Take 1 tablet (30 mg total) by mouth daily., Disp: 30 tablet, Rfl: 3 .  lactulose (CHRONULAC) 10 GM/15ML solution, Take 15 mLs (10 g total) by mouth 2 (two) times daily. (Patient not taking: Reported on 10/15/2016), Disp: 946 mL, Rfl: 1 .  Lancet Devices (ACCU-CHEK SOFTCLIX) lancets, Use as instructed for once daily testing of blood sugar, Disp: 1 each, Rfl: 0 .  meclizine (ANTIVERT) 25 MG tablet, Take 25 mg by mouth 2 (two) times daily as needed for nausea., Disp: , Rfl:  .  metolazone (ZAROXOLYN) 2.5 MG tablet, Take 1 tablet once as directed by CHF clinic., Disp: 3 tablet, Rfl: 0 .  potassium chloride SA (KLOR-CON M20) 20 MEQ tablet, Take 40 meq (2 tabs) once today and once tomorrow., Disp: 4 tablet, Rfl: 0 .  spironolactone (ALDACTONE) 25 MG tablet, TAKE 1 TABLET BY MOUTH DAILY., Disp: 90 tablet, Rfl: 4 .  torsemide (DEMADEX) 20 MG tablet, Take 4 tablets (80 mg total) by mouth 2 (two) times daily., Disp: 240 tablet, Rfl: 3 .  XARELTO 20 MG TABS tablet, take 1 TABLET BY MOUTH before supper in the evening, Disp: 28 tablet, Rfl: 2 Allergies  Allergen Reactions  . Tape Itching    Paper tape please.     Social History   Social History  . Marital status: Divorced    Spouse name: N/A  . Number of children: 3  . Years of education: N/A   Occupational History  . disabled    Social History Main Topics  . Smoking status: Current Every Day Smoker    Packs/day: 0.50    Years: 40.00    Types: Cigarettes  . Smokeless tobacco: Never Used  . Alcohol use No  . Drug use: No  . Sexual activity: No   Other Topics Concern  . Not on file   Social History Narrative   Has an apartment with a roommate. He was living on the streets in Jan 24, 2013.  He reports that his father died in [place] in  Jan 24, 2013.  He is divorced.  He is no longer estranged from his son, but still from his daughter who lives locally.  Neither of his parents, nor any siblings have any history of CAD.    Physical Exam  Pulmonary/Chest: No respiratory distress. He has no wheezes. He has no rales.  Abdominal: He exhibits no distension. There is no tenderness. There is no guarding.  Musculoskeletal: He exhibits no edema.  Skin: Skin is warm and dry. He is not diaphoretic.        Future Appointments Date Time Provider Department Center  02/11/2017 3:00 PM Jaclyn Shaggy, MD [facility] None    ATF pt CAO x4 sitting on the side of his bed talking on his cell phone.  Pt  showed me his pill pack and he was worried about glipizide not being in his packs.  I showed him the pill in the pack and he was reassured that all of his meds were there.  Pt has taken all of his meds besides the gabapentin which he is still taking as needed. He took his morning meds during our visit today. Pt rx packs verified.  Pt is c/o right foot pain.  He went to his PCP on Monday and he was given pain meds for the pain.  Pt hasn't gotten the rx filled.  Pt denies sob, dizziness, lightheadedness and chest pain.  I will continue seeing pt bi-weekly.   BP 110/68   Pulse 66   Resp 16   Wt 243 lb (110.2 kg)   BMI 40.44 kg/m    Cbg 321 (pt hasnt taken his insulin yet; stated that he will take it in a few mins) Weight yesterday-243 Last visit weight-239    Tedra Coppernoll, EMT Paramedic 12/04/2016    ACTION: Home visit completed Next visit planned for in two weeks

## 2016-12-17 ENCOUNTER — Other Ambulatory Visit (HOSPITAL_COMMUNITY): Payer: Self-pay | Admitting: Cardiology

## 2016-12-17 ENCOUNTER — Telehealth (HOSPITAL_COMMUNITY): Payer: Self-pay | Admitting: *Deleted

## 2016-12-17 ENCOUNTER — Inpatient Hospital Stay (HOSPITAL_COMMUNITY)
Admission: AD | Admit: 2016-12-17 | Discharge: 2016-12-21 | DRG: 292 | Disposition: A | Discharge status: Home / Self Care | Payer: Medicare Other | Source: Ambulatory Visit | Attending: Cardiology | Admitting: Cardiology

## 2016-12-17 ENCOUNTER — Inpatient Hospital Stay (HOSPITAL_COMMUNITY): Payer: Medicare Other

## 2016-12-17 ENCOUNTER — Ambulatory Visit (HOSPITAL_COMMUNITY)
Admission: RE | Admit: 2016-12-17 | Discharge: 2016-12-17 | Disposition: A | Discharge status: Home / Self Care | Payer: Medicare Other | Source: Ambulatory Visit | Attending: Cardiology | Admitting: Cardiology

## 2016-12-17 ENCOUNTER — Encounter (HOSPITAL_COMMUNITY): Payer: Self-pay | Admitting: *Deleted

## 2016-12-17 ENCOUNTER — Other Ambulatory Visit (HOSPITAL_COMMUNITY): Payer: Self-pay

## 2016-12-17 VITALS — Wt 254.2 lb

## 2016-12-17 DIAGNOSIS — F17210 Nicotine dependence, cigarettes, uncomplicated: Secondary | ICD-10-CM | POA: Diagnosis present

## 2016-12-17 DIAGNOSIS — I2510 Atherosclerotic heart disease of native coronary artery without angina pectoris: Secondary | ICD-10-CM | POA: Diagnosis present

## 2016-12-17 DIAGNOSIS — M109 Gout, unspecified: Secondary | ICD-10-CM | POA: Diagnosis present

## 2016-12-17 DIAGNOSIS — Z9111 Patient's noncompliance with dietary regimen: Secondary | ICD-10-CM

## 2016-12-17 DIAGNOSIS — I482 Chronic atrial fibrillation: Secondary | ICD-10-CM | POA: Diagnosis present

## 2016-12-17 DIAGNOSIS — I10 Essential (primary) hypertension: Secondary | ICD-10-CM | POA: Diagnosis present

## 2016-12-17 DIAGNOSIS — Z6841 Body Mass Index (BMI) 40.0 and over, adult: Secondary | ICD-10-CM | POA: Diagnosis not present

## 2016-12-17 DIAGNOSIS — Z7901 Long term (current) use of anticoagulants: Secondary | ICD-10-CM | POA: Diagnosis not present

## 2016-12-17 DIAGNOSIS — Z955 Presence of coronary angioplasty implant and graft: Secondary | ICD-10-CM | POA: Diagnosis not present

## 2016-12-17 DIAGNOSIS — D638 Anemia in other chronic diseases classified elsewhere: Secondary | ICD-10-CM | POA: Diagnosis present

## 2016-12-17 DIAGNOSIS — I252 Old myocardial infarction: Secondary | ICD-10-CM | POA: Diagnosis not present

## 2016-12-17 DIAGNOSIS — Z951 Presence of aortocoronary bypass graft: Secondary | ICD-10-CM

## 2016-12-17 DIAGNOSIS — M25561 Pain in right knee: Secondary | ICD-10-CM | POA: Diagnosis present

## 2016-12-17 DIAGNOSIS — Z9119 Patient's noncompliance with other medical treatment and regimen: Secondary | ICD-10-CM

## 2016-12-17 DIAGNOSIS — Z794 Long term (current) use of insulin: Secondary | ICD-10-CM | POA: Diagnosis not present

## 2016-12-17 DIAGNOSIS — S8991XA Unspecified injury of right lower leg, initial encounter: Secondary | ICD-10-CM | POA: Diagnosis not present

## 2016-12-17 DIAGNOSIS — Z833 Family history of diabetes mellitus: Secondary | ICD-10-CM | POA: Diagnosis not present

## 2016-12-17 DIAGNOSIS — E1151 Type 2 diabetes mellitus with diabetic peripheral angiopathy without gangrene: Secondary | ICD-10-CM | POA: Diagnosis present

## 2016-12-17 DIAGNOSIS — I110 Hypertensive heart disease with heart failure: Secondary | ICD-10-CM | POA: Diagnosis not present

## 2016-12-17 DIAGNOSIS — I5043 Acute on chronic combined systolic (congestive) and diastolic (congestive) heart failure: Secondary | ICD-10-CM

## 2016-12-17 DIAGNOSIS — I4891 Unspecified atrial fibrillation: Secondary | ICD-10-CM

## 2016-12-17 DIAGNOSIS — F17200 Nicotine dependence, unspecified, uncomplicated: Secondary | ICD-10-CM

## 2016-12-17 DIAGNOSIS — E785 Hyperlipidemia, unspecified: Secondary | ICD-10-CM | POA: Diagnosis present

## 2016-12-17 DIAGNOSIS — Z91199 Patient's noncompliance with other medical treatment and regimen due to unspecified reason: Secondary | ICD-10-CM

## 2016-12-17 DIAGNOSIS — I5023 Acute on chronic systolic (congestive) heart failure: Secondary | ICD-10-CM | POA: Diagnosis present

## 2016-12-17 DIAGNOSIS — Z95810 Presence of automatic (implantable) cardiac defibrillator: Secondary | ICD-10-CM | POA: Diagnosis present

## 2016-12-17 DIAGNOSIS — Z23 Encounter for immunization: Secondary | ICD-10-CM | POA: Diagnosis present

## 2016-12-17 DIAGNOSIS — I5022 Chronic systolic (congestive) heart failure: Secondary | ICD-10-CM | POA: Diagnosis present

## 2016-12-17 DIAGNOSIS — E1165 Type 2 diabetes mellitus with hyperglycemia: Secondary | ICD-10-CM | POA: Diagnosis present

## 2016-12-17 DIAGNOSIS — R52 Pain, unspecified: Secondary | ICD-10-CM

## 2016-12-17 DIAGNOSIS — I739 Peripheral vascular disease, unspecified: Secondary | ICD-10-CM

## 2016-12-17 DIAGNOSIS — Z9114 Patient's other noncompliance with medication regimen: Secondary | ICD-10-CM

## 2016-12-17 DIAGNOSIS — Z452 Encounter for adjustment and management of vascular access device: Secondary | ICD-10-CM | POA: Diagnosis not present

## 2016-12-17 DIAGNOSIS — Z91048 Other nonmedicinal substance allergy status: Secondary | ICD-10-CM

## 2016-12-17 DIAGNOSIS — Z638 Other specified problems related to primary support group: Secondary | ICD-10-CM | POA: Diagnosis not present

## 2016-12-17 DIAGNOSIS — I255 Ischemic cardiomyopathy: Secondary | ICD-10-CM

## 2016-12-17 DIAGNOSIS — I5042 Chronic combined systolic (congestive) and diastolic (congestive) heart failure: Secondary | ICD-10-CM

## 2016-12-17 LAB — COMPREHENSIVE METABOLIC PANEL
ALK PHOS: 137 U/L — AB (ref 38–126)
ALT: 19 U/L (ref 17–63)
ANION GAP: 10 (ref 5–15)
AST: 21 U/L (ref 15–41)
Albumin: 3.3 g/dL — ABNORMAL LOW (ref 3.5–5.0)
BUN: 19 mg/dL (ref 6–20)
CALCIUM: 8.2 mg/dL — AB (ref 8.9–10.3)
CO2: 26 mmol/L (ref 22–32)
Chloride: 96 mmol/L — ABNORMAL LOW (ref 101–111)
Creatinine, Ser: 1.39 mg/dL — ABNORMAL HIGH (ref 0.61–1.24)
GFR calc non Af Amer: 54 mL/min — ABNORMAL LOW (ref >60)
Glucose, Bld: 214 mg/dL — ABNORMAL HIGH (ref 65–99)
Potassium: 3.1 mmol/L — ABNORMAL LOW (ref 3.5–5.1)
SODIUM: 132 mmol/L — AB (ref 135–145)
Total Bilirubin: 0.9 mg/dL (ref 0.3–1.2)
Total Protein: 7.1 g/dL (ref 6.5–8.1)

## 2016-12-17 LAB — CBC
HCT: 40.3 % (ref 39.0–52.0)
HEMOGLOBIN: 12.8 g/dL — AB (ref 13.0–17.0)
MCH: 27.6 pg (ref 26.0–34.0)
MCHC: 31.8 g/dL (ref 30.0–36.0)
MCV: 86.9 fL (ref 78.0–100.0)
Platelets: 220 10*3/uL (ref 150–400)
RBC: 4.64 MIL/uL (ref 4.22–5.81)
RDW: 18.4 % — ABNORMAL HIGH (ref 11.5–15.5)
WBC: 10.9 10*3/uL — ABNORMAL HIGH (ref 4.0–10.5)

## 2016-12-17 LAB — GLUCOSE, CAPILLARY
Glucose-Capillary: 143 mg/dL — ABNORMAL HIGH (ref 65–99)
Glucose-Capillary: 170 mg/dL — ABNORMAL HIGH (ref 65–99)

## 2016-12-17 LAB — BRAIN NATRIURETIC PEPTIDE: B Natriuretic Peptide: 394.2 pg/mL — ABNORMAL HIGH (ref 0.0–100.0)

## 2016-12-17 LAB — HEMOGLOBIN A1C
HEMOGLOBIN A1C: 8.5 % — AB (ref 4.8–5.6)
Mean Plasma Glucose: 197.25 mg/dL

## 2016-12-17 LAB — TSH: TSH: 2.269 u[IU]/mL (ref 0.350–4.500)

## 2016-12-17 LAB — MAGNESIUM: MAGNESIUM: 1.7 mg/dL (ref 1.7–2.4)

## 2016-12-17 MED ORDER — RIVAROXABAN 20 MG PO TABS
20.0000 mg | ORAL_TABLET | Freq: Every day | ORAL | Status: DC
  Administered 2016-12-17 – 2016-12-20 (×4): 20 mg via ORAL
  Filled 2016-12-17 (×4): qty 1

## 2016-12-17 MED ORDER — POTASSIUM CHLORIDE CRYS ER 20 MEQ PO TBCR: 40.0000 meq | EXTENDED_RELEASE_TABLET | Freq: Every day | ORAL | Status: DC

## 2016-12-17 MED ORDER — ACETAMINOPHEN 325 MG PO TABS
650.0000 mg | ORAL_TABLET | ORAL | Status: DC | PRN
Start: 2016-12-17 — End: 2016-12-21

## 2016-12-17 MED ORDER — SODIUM CHLORIDE 0.9% FLUSH: 10.0000 mL | INTRAVENOUS | Status: DC | PRN

## 2016-12-17 MED ORDER — GABAPENTIN 300 MG PO CAPS
300.0000 mg | ORAL_CAPSULE | Freq: Two times a day (BID) | ORAL | Status: DC
  Administered 2016-12-17 – 2016-12-21 (×8): 300 mg via ORAL
  Filled 2016-12-17 (×8): qty 1

## 2016-12-17 MED ORDER — OXYCODONE-ACETAMINOPHEN 5-325 MG PO TABS
1.0000 | ORAL_TABLET | Freq: Four times a day (QID) | ORAL | Status: DC | PRN
  Administered 2016-12-17 – 2016-12-19 (×5): 1 via ORAL
  Filled 2016-12-17 (×5): qty 1

## 2016-12-17 MED ORDER — FERROUS SULFATE 325 (65 FE) MG PO TABS
325.0000 mg | ORAL_TABLET | Freq: Every day | ORAL | Status: DC
  Administered 2016-12-19 – 2016-12-21 (×3): 325 mg via ORAL
  Filled 2016-12-17 (×3): qty 1

## 2016-12-17 MED ORDER — PNEUMOCOCCAL VAC POLYVALENT 25 MCG/0.5ML IJ INJ: 0.5000 mL | INJECTION | INTRAMUSCULAR | Status: DC

## 2016-12-17 MED ORDER — ISOSORBIDE MONONITRATE ER 30 MG PO TB24: 30.0000 mg | ORAL_TABLET | Freq: Every day | ORAL | 3 refills | Status: DC

## 2016-12-17 MED ORDER — INFLUENZA VAC SPLIT QUAD 0.5 ML IM SUSY
0.5000 mL | PREFILLED_SYRINGE | INTRAMUSCULAR | Status: AC
  Administered 2016-12-18: 0.5 mL via INTRAMUSCULAR
  Filled 2016-12-17: qty 0.5

## 2016-12-17 MED ORDER — MILRINONE LACTATE IN DEXTROSE 20-5 MG/100ML-% IV SOLN
0.2500 ug/kg/min | INTRAVENOUS | Status: DC
  Administered 2016-12-17 – 2016-12-21 (×9): 0.25 ug/kg/min via INTRAVENOUS
  Filled 2016-12-17 (×10): qty 100

## 2016-12-17 MED ORDER — INSULIN ASPART 100 UNIT/ML ~~LOC~~ SOLN
0.0000 [IU] | Freq: Three times a day (TID) | SUBCUTANEOUS | Status: DC
  Administered 2016-12-18: 5 [IU] via SUBCUTANEOUS
  Administered 2016-12-18: 2 [IU] via SUBCUTANEOUS
  Administered 2016-12-18: 5 [IU] via SUBCUTANEOUS
  Administered 2016-12-19: 8 [IU] via SUBCUTANEOUS
  Administered 2016-12-19: 11 [IU] via SUBCUTANEOUS
  Administered 2016-12-19: 15 [IU] via SUBCUTANEOUS
  Administered 2016-12-20: 11 [IU] via SUBCUTANEOUS
  Administered 2016-12-20: 3 [IU] via SUBCUTANEOUS
  Administered 2016-12-21: 5 [IU] via SUBCUTANEOUS

## 2016-12-17 MED ORDER — SODIUM CHLORIDE 0.9% FLUSH
3.0000 mL | Freq: Two times a day (BID) | INTRAVENOUS | Status: DC
  Administered 2016-12-17 – 2016-12-19 (×5): 3 mL via INTRAVENOUS

## 2016-12-17 MED ORDER — ATORVASTATIN CALCIUM 40 MG PO TABS
40.0000 mg | ORAL_TABLET | Freq: Every day | ORAL | Status: DC
  Administered 2016-12-18 – 2016-12-20 (×3): 40 mg via ORAL
  Filled 2016-12-17 (×3): qty 1

## 2016-12-17 MED ORDER — POTASSIUM CHLORIDE CRYS ER 20 MEQ PO TBCR
40.0000 meq | EXTENDED_RELEASE_TABLET | Freq: Three times a day (TID) | ORAL | Status: DC
  Administered 2016-12-17: 40 meq via ORAL
  Filled 2016-12-17: qty 2

## 2016-12-17 MED ORDER — FUROSEMIDE 10 MG/ML IJ SOLN: 80.0000 mg | Freq: Two times a day (BID) | INTRAMUSCULAR | Status: DC

## 2016-12-17 MED ORDER — SODIUM CHLORIDE 0.9 % IV SOLN
250.0000 mL | INTRAVENOUS | Status: DC | PRN
  Administered 2016-12-17: 250 mL via INTRAVENOUS

## 2016-12-17 MED ORDER — INSULIN GLARGINE 100 UNIT/ML ~~LOC~~ SOLN
25.0000 [IU] | Freq: Every day | SUBCUTANEOUS | Status: DC
  Administered 2016-12-17 – 2016-12-18 (×2): 25 [IU] via SUBCUTANEOUS
  Filled 2016-12-17 (×2): qty 0.25

## 2016-12-17 MED ORDER — ONDANSETRON HCL 4 MG/2ML IJ SOLN: 4.0000 mg | Freq: Four times a day (QID) | INTRAMUSCULAR | Status: DC | PRN

## 2016-12-17 MED ORDER — TORSEMIDE 20 MG PO TABS: 80.0000 mg | ORAL_TABLET | Freq: Two times a day (BID) | ORAL | 3 refills | Status: DC

## 2016-12-17 MED ORDER — SODIUM CHLORIDE 0.9% FLUSH: 3.0000 mL | INTRAVENOUS | Status: DC | PRN

## 2016-12-17 MED ORDER — FUROSEMIDE 10 MG/ML IJ SOLN
120.0000 mg | Freq: Two times a day (BID) | INTRAVENOUS | Status: DC
  Administered 2016-12-18: 120 mg via INTRAVENOUS
  Filled 2016-12-17 (×2): qty 12

## 2016-12-17 MED ORDER — ALPRAZOLAM 0.25 MG PO TABS
0.2500 mg | ORAL_TABLET | Freq: Two times a day (BID) | ORAL | Status: DC | PRN
  Administered 2016-12-17 – 2016-12-19 (×3): 0.25 mg via ORAL
  Filled 2016-12-17 (×3): qty 1

## 2016-12-17 MED ORDER — AMIODARONE HCL 200 MG PO TABS
200.0000 mg | ORAL_TABLET | Freq: Every day | ORAL | Status: DC
  Administered 2016-12-18 – 2016-12-21 (×4): 200 mg via ORAL
  Filled 2016-12-17 (×4): qty 1

## 2016-12-17 MED ORDER — LORATADINE 10 MG PO TABS
10.0000 mg | ORAL_TABLET | Freq: Every day | ORAL | Status: DC
  Administered 2016-12-18 – 2016-12-21 (×4): 10 mg via ORAL
  Filled 2016-12-17 (×4): qty 1

## 2016-12-17 MED ORDER — DIGOXIN 125 MCG PO TABS
0.0625 mg | ORAL_TABLET | Freq: Every day | ORAL | Status: DC
  Administered 2016-12-17 – 2016-12-21 (×5): 0.0625 mg via ORAL
  Filled 2016-12-17 (×5): qty 1

## 2016-12-17 MED ORDER — ALBUTEROL SULFATE (2.5 MG/3ML) 0.083% IN NEBU: 2.5000 mg | INHALATION_SOLUTION | Freq: Four times a day (QID) | RESPIRATORY_TRACT | Status: DC | PRN

## 2016-12-17 NOTE — Addendum Note (Signed)
Encounter addended by: Little Ishikawa, NP on: 12/17/2016  3:08 PM<BR>    Actions taken: Sign clinical note

## 2016-12-17 NOTE — Progress Notes (Signed)
Paged Suzzette Righter, NP to clarify orders for duplicate IV lasix and pain medication. Awaiting callback.

## 2016-12-17 NOTE — Progress Notes (Signed)
Advanced Heart Failure Medication Review by a Pharmacist  Does the patient  feel that his/her medications are working for him/her?  no  Has the patient been experiencing any side effects to the medications prescribed?  no  Does the patient measure his/her own blood pressure or blood glucose at home?  yes   Does the patient have any problems obtaining medications due to transportation or finances?   yes  Understanding of regimen: fair Understanding of indications: fair Potential of compliance: fair Patient understands to avoid NSAIDs. Patient understands to avoid decongestants.  Issues to address at subsequent visits: Compliance   Pharmacist comments:  Mr. Gaetani is a pleasant 61 yo M presenting without a medication list but receiving his medications pre-packed from [facility] and Geraldine Contras (paramedicine) sees him biweekly. He reports good compliance with his regimen but states that he is only urinating once daily now. He did not have any other medication-related questions or concerns for me at this time.   [name]. Bonnye Fava, PharmD, BCPS, CPP Clinical Pharmacist Pager: [phone] Phone: [phone] 12/17/2016 2:10 PM      Time with patient: 12 minutes Preparation and documentation time: 2 minutes Total time: 14 minutes

## 2016-12-17 NOTE — Telephone Encounter (Signed)
Advanced Heart Failure Triage Encounter  Patient Name: Jacob [name]   Date of Call: 12/17/2016  Problem:  Dee with paramedicine went out to see patient and he was c/o severe shortness of breath, swelling in abdomen, no urine output. REDS vest 37%.  Weight 248lbs. She feels patient needs to be seen in office.   Plan:  Patient added to APP clinic schedule today at 1:30 to be evaluated in office.   Modesta Messing, CMA

## 2016-12-17 NOTE — H&P (Signed)
Advanced Heart Failure Team History and Physical Note   Primary Physician:  [facility].  Primary Cardiologist:  Dr. Gala Romney  Reason for Admission: Acute on chronic systolic CHF   HPI:    Jacob [name] is a 61 year old [place] male with history of systolic HF EF 20-25% via Echo 08/03/14 s/p INCEPTA ICD implant 6/14, RV dysfunction, CAD s/p CABG x 2 w/ RF MAZE 7/14 at [facility], DM type II, Chronic afib on xarelto, GI bleed 11/28/2014, and HLD.   Admitted 10/9 through 01/12/16 with marked volume overload. Diuresed with IV lasix and milrinone. D/C weight 209 pounds.  Jacob [name] has been followed by the CHF clinic and paramedicine. Last seen in the clinic in July 2018, and was stable however Jacob [name] continues to struggle with dietary noncompliance.   Jacob [name] was seen by paramedicine today, was noted to be very SOB at rest. Therefore, presented to the CHF clinic for an acute visit. Weight up about 12 pounds, REDs vest reading was 37%. Jacob [name] was eating high salt foods at home. Has been more compliant with medications lately since paramedicine is involved. Jacob [name] tells me Jacob [name] is SOB with ADL's, feels fatigued. No longer urinating with torsemide or metolazone. EKG with Afib (which Jacob [name] has chronically) rate 105 bpm. Denies chest pain, palpitations. BP is 92/60 sitting and 80/54 standing.   Review of Systems: [y] = yes,  = no   General: Weight gain [ y]; Weight loss ; Anorexia ; Fatigue [place] ]; Fever ; Chills ; Weakness   Cardiac: Chest pain/pressure ; Resting SOB ; Exertional SOB ; Orthopnea ; Pedal Edema ; Palpitations ; Syncope ; Presyncope ; Paroxysmal nocturnal dyspnea[ ]   Pulmonary: Cough ; Wheezing[ ] ; Hemoptysis[ ] ; Sputum ; Snoring   GI: Vomiting[ ] ; Dysphagia[ ] ; Melena[ ] ; Hematochezia ; Heartburn[ ] ; Abdominal pain ; Constipation ; Diarrhea ; BRBPR   GU: Hematuria[ ] ; Dysuria ; Nocturia[ ]   Vascular: Pain in legs with  walking ; Pain in feet with lying flat ; Non-healing sores ; Stroke ; TIA ; Slurred speech ;  Neuro: Headaches[ ] ; Vertigo[ ] ; Seizures[ ] ; Paresthesias[ ] ;Blurred vision ; Diplopia ; Vision changes   Ortho/Skin: Arthritis ; Joint pain ; Muscle pain ; Joint swelling ; Back Pain ; Rash   Psych: Depression[ ] ; Anxiety[ ]   Heme: Bleeding problems ; Clotting disorders ; Anemia   Endocrine: Diabetes ; Thyroid dysfunction[ ]    Home Medications Prior to Admission medications   Medication Sig Start Date End Date Taking? Authorizing Provider  ACCU-CHEK SOFTCLIX LANCETS lancets Use for once daily testing of blood sugar 11/28/15   Jaclyn Shaggy, MD  albuterol (PROVENTIL HFA;VENTOLIN HFA) 108 (90 Base) MCG/ACT inhaler Inhale 2 puffs into the lungs every 6 (six) hours as needed for wheezing or shortness of breath. 03/29/15   Jaclyn Shaggy, MD  albuterol (PROVENTIL) (2.5 MG/3ML) 0.083% nebulizer solution Take 3 mLs (2.5 mg total) by nebulization every 6 (six) hours as needed for wheezing or shortness of breath. 04/18/15   Jaclyn Shaggy, MD  amiodarone (PACERONE) 200 MG tablet take 1 TABLET BY MOUTH EVERY DAY every morning 11/11/16   Laurey Morale, MD  atorvastatin (LIPITOR) 40 MG tablet Take 1 tablet (40 mg total) by mouth  daily at 6 PM. 11/11/16   Jaclyn Shaggy, MD  Blood Glucose Monitoring Suppl (ACCU-CHEK AVIVA) device Use as instructed1 times daily before meals 11/28/15   Jaclyn Shaggy, MD  cetirizine (ZYRTEC) 10 MG tablet Take 1 tablet (10 mg total) by mouth daily. 11/11/16   Jaclyn Shaggy, MD  digoxin (LANOXIN) 0.125 MG tablet Take 0.5 tablets (0.0625 mg total) by mouth daily. 11/12/16   Bensimhon, Bevelyn Buckles, MD  ferrous sulfate 325 (65 FE) MG tablet TAKE 1 TABLET BY MOUTH 3 TIMES DAILY WITH MEALS. 05/22/16   [name], MD  gabapentin (NEURONTIN) 300 MG capsule Take 1 capsule (300 mg total) by mouth 2 (two) times daily. 11/11/16   Jaclyn Shaggy, MD   glipiZIDE (GLUCOTROL) 10 MG tablet Take 10 mg by mouth daily before breakfast.    [provider]  glucose blood (ACCU-CHEK AVIVA) test strip Use as instructed for 1 times daily testing of blood sugar 11/28/15   Jaclyn Shaggy, MD  hydrALAZINE (APRESOLINE) 25 MG tablet take 1 TABLET BY MOUTH THREE TIMES DAILY every morning, noon,evening 11/11/16   Laurey Morale, MD  Insulin Glargine (LANTUS) 100 UNIT/ML Solostar Pen Inject 25 Units into the skin daily at 10 pm. 11/11/16   Jaclyn Shaggy, MD  Insulin Pen Needle 31G X 5 MM MISC Use as directed for once daily insulin injection 11/11/16   Jaclyn Shaggy, MD  isosorbide mononitrate (IMDUR) 30 MG 24 hr tablet Take 1 tablet (30 mg total) by mouth daily. 12/17/16   Bensimhon, Bevelyn Buckles, MD  Lancet Devices [facility]) lancets Use as instructed for once daily testing of blood sugar 11/28/15   Jaclyn Shaggy, MD  meclizine (ANTIVERT) 25 MG tablet Take 25 mg by mouth 2 (two) times daily as needed for nausea.    [provider]  metolazone (ZAROXOLYN) 2.5 MG tablet Take 1 tablet once as directed by CHF clinic. Patient not taking: Reported on 12/17/2016 11/13/16   Bensimhon, Bevelyn Buckles, MD  spironolactone (ALDACTONE) 25 MG tablet TAKE 1 TABLET BY MOUTH DAILY. 09/03/16   Bensimhon, Bevelyn Buckles, MD  torsemide (DEMADEX) 20 MG tablet Take 4 tablets (80 mg total) by mouth 2 (two) times daily. 12/17/16   Bensimhon, Bevelyn Buckles, MD  XARELTO 20 MG TABS tablet take 1 TABLET BY MOUTH before supper in the evening 11/12/16   Bensimhon, Bevelyn Buckles, MD    Past Medical History: Past Medical History:  Diagnosis Date  . Atrial fibrillation (HCC)    RVR 10/2014  . Automatic implantable cardioverter-defibrillator in situ   . CAD (coronary artery disease) Sept 2013   s/p cardiac cath showing occlusion of small RCA with collaterals  . CHF (congestive heart failure) (HCC)    20 to 25 % EF and RV dysfunction by 07/2014 echo   . Chronic anticoagulation    on xarelto.   .  High cholesterol   . Hypertension   . Myocardial infarction (HCC) 2014  . Noncompliance    homelessness contributing.   . Peripheral arterial disease (HCC)   . Type II diabetes mellitus (HCC)     Past Surgical History: Past Surgical History:  Procedure Laterality Date  . CARDIAC CATHETERIZATION  09/2012  . CORONARY ANGIOPLASTY WITH STENT PLACEMENT  11/2011   "1"  . CORONARY ARTERY BYPASS GRAFT  09/2012   2 vessels per patient [name]   . ESOPHAGOGASTRODUODENOSCOPY N/A 11/28/2014   Procedure: ESOPHAGOGASTRODUODENOSCOPY (EGD);  Surgeon: Beverley Fiedler, MD;  Location: [facility] ENDOSCOPY;  Service: Endoscopy;  Laterality:  N/A;  . ILIAC ARTERY STENT Right 08/30/2013  . IMPLANTABLE CARDIOVERTER DEFIBRILLATOR IMPLANT     [place] in 07/2012; [employer]  . LEFT AND RIGHT HEART CATHETERIZATION WITH CORONARY ANGIOGRAM N/A 12/02/2011   Procedure: LEFT AND RIGHT HEART CATHETERIZATION WITH CORONARY ANGIOGRAM;  Surgeon: Kathleene Hazel, MD;  Location[facility] CATH LAB;  Service: Cardiovascular;  Laterality: N/A;  . LOWER EXTREMITY ANGIOGRAM N/A 08/30/2013   Procedure: LOWER EXTREMITY ANGIOGRAM;  Surgeon: Runell Gess, MD;  Location: [facility] CATH LAB;  Service: Cardiovascular;  Laterality: N/A;  . LOWER EXTREMITY ANGIOGRAM N/A 12/02/2013   Procedure: LOWER EXTREMITY ANGIOGRAM;  Surgeon: Runell Gess, MD;  Location: [facility] CATH LAB;  Service: Cardiovascular;  Laterality: N/A;    Family History:  Family History  Problem Relation Age of Onset  . Diabetes Mother     Social History: Social History   Social History  . Marital status: Divorced    Spouse name: N/A  . Number of children: 3  . Years of education: N/A   Occupational History  . disabled    Social History Main Topics  . Smoking status: Current Every Day Smoker    Packs/day: 0.50    Years: 40.00    Types: Cigarettes  . Smokeless tobacco: Never Used  . Alcohol use No  . Drug use: No  . Sexual activity: No   Other Topics Concern  .  Not on file   Social History Narrative   Has an apartment with a roommate. Jacob [name] was living on the streets in 01-01-2013.  Jacob [name] reports that his father died in [place] in Jan 01, 2013.  Jacob [name] is divorced.  Jacob [name] is no longer estranged from his son, but still from his daughter who lives locally.  Neither of his parents, nor any siblings have any history of CAD.    Allergies:  Allergies  Allergen Reactions  . Tape Itching    Paper tape please.    Objective:    Vital Signs:   Pulse Rate:  [96] 96 (09/25 1008) Resp:  [16] 16 (09/25 1008) BP: (138)/(70) 138/70 (09/25 1008) SpO2:  [96 %] 96 % (09/25 1008) Weight:  [248 lb (112.5 kg)] 248 lb (112.5 kg) (09/25 1008)     Physical Exam     General: Ill appearing. No respiratory difficulty HEENT: Normal Neck: Supple. JVP to ear. Carotids 2+ bilat; no bruits. No lymphadenopathy or thyromegaly appreciated. Cor: PMI nondisplaced. Tachy, regular rate & rhythm. No rubs, gallops or murmurs. Lungs: Clear Abdomen: Obese, firm. Nontender, ++ distended. No hepatosplenomegaly. No bruits or masses. Good bowel sounds. Extremities: No cyanosis, clubbing, rash. Trace pedal edema bilaterally.  Neuro: Alert & oriented x 3, cranial nerves grossly intact. moves all 4 extremities w/o difficulty. Affect pleasant.   Telemetry   Afib   EKG   12/17/16 - Afib, 105 bpm. Personally reviewed  Labs     Basic Metabolic Panel: No results for input(s): NA, K, CL, CO2, GLUCOSE, BUN, CREATININE, CALCIUM, MG, PHOS in the last 168 hours.  Liver Function Tests: No results for input(s): AST, ALT, ALKPHOS, BILITOT, PROT, ALBUMIN in the last 168 hours. No results for input(s): LIPASE, AMYLASE in the last 168 hours. No results for input(s): AMMONIA in the last 168 hours.  CBC:  Recent Labs Lab 12/17/16 1430  WBC 10.9*  HGB 12.8*  HCT 40.3  MCV 86.9  PLT 220    Cardiac Enzymes: No results for input(s): CKTOTAL, CKMB, CKMBINDEX, TROPONINI in the last 168  hours.  BNP: BNP (last 3 results)  Recent Labs  01/19/16 1126 02/09/16 1210 02/19/16 1608  BNP 389.7* 323.1* 303.2*    ProBNP (last 3 results) No results for input(s): PROBNP in the last 8760 hours.   CBG: No results for input(s): GLUCAP in the last 168 hours.  Coagulation Studies: No results for input(s): LABPROT, INR in the last 72 hours.  Imaging: No results found.   Patient Profile   Jacob [name] is a 61 year old [place] male with history of systolic HF EF 20-25% via Echo 08/03/14 s/p INCEPTA ICD implant 6/14, RV dysfunction, CAD s/p CABG x 2 w/ RF MAZE 7/14 at [facility], DM type II, Chronic afib on xarelto, GI bleed 11/28/2014, and HLD.     Assessment/Plan   1.Acute on Chronic Systolic Heart Failure: Echo 10/2015 EF 25-30%. Has [employer] ICD.  - NYHA III - Volume elevated on exam in the setting of dietary non complinace.  - Orthostatic and dizzy. Will admit for IV diuresis. 120 mg of lasix BID. Will also insert PICC to follow co ox, CVP. Jacob [name] will likely need milrinone as Jacob [name] has in the past. Last dc weight was 209 pounds in Nov. 2017. I think that is likely close to his dry weight.  - Hold Spiro with hypotension - Continue digoxin 0.0625 mg daily. Check dig level .  - Hold hydralazine with hypotension.   - No beta blocker with acute decompensation although Jacob [name] is not currently on one.   2. Chronic A fib - Rate controlled. On Amiodarone 200 mg daily.  - Continue Xarelto for anticoagulation - Denies melena and hematochezia. CBC today.   3. H/O GI Bleed - duodenal ulcer with clip 11/2003 - CBC today.    4. PAD- Last ABI 2015  - R EIA stent with known bilateral SFA occlusion.  - Follows with Dr. Allyson Sabal: "Jacob [name] is not a candidate for endovascular therapy of his SFAs and would require femoropopliteal bypass grafting which I think Jacob [name] would be high risk for given his cardiac situation" - No change.  - Continues to smoke.   5. Social  Issues - Resides in low income  housing.  - Nurse, adult.   6. Smoking - Encouraged cessation  7. HTN - Now hypotensive.     Little Ishikawa, NP 12/17/2016, 2:51 PM  Advanced Heart Failure Team Pager [phone] (M-F; 7a - 4p)  Please contact [facility] for night-coverage after hours (4p -7a ) and weekends on [email or web address]  Patient seen with NP, agree with the above note.  Jacob [name] is markedly volume overloaded on exam with NYHA class IV symptoms and SBP in 80s-90s.  Jacob [name] has a history of noncompliance as well as low output HF.  Not a candidate for home milrinone or advanced therapies.   - Will admit, place PICC to follow co-ox and CVP.  - Will start milrinone empirically while in hospital given history of low output HF and need for significant diuresis.  - Start Lasix 120 mg IV bid.   - Continue Xarelto as well as amiodarone for rate control with chronic atrial fibrillation.  - Hold hydralazine/spironolactone with soft BP.    Marca Ancona 12/17/2016 4:23 PM

## 2016-12-17 NOTE — Addendum Note (Signed)
Encounter addended by: Chyrl Civatte, RN on: 12/17/2016  2:35 PM<BR>    Actions taken: Order list changed, Diagnosis association updated

## 2016-12-17 NOTE — Progress Notes (Signed)
Peripherally Inserted Central Catheter/Midline Placement  The IV Nurse has discussed with the patient and/or persons authorized to consent for the patient, the purpose of this procedure and the potential benefits and risks involved with this procedure.  The benefits include less needle sticks, lab draws from the catheter, and the patient may be discharged home with the catheter. Risks include, but not limited to, infection, bleeding, blood clot (thrombus formation), and puncture of an artery; nerve damage and irregular heartbeat and possibility to perform a PICC exchange if needed/ordered by physician.  Alternatives to this procedure were also discussed.  Bard Power PICC patient education guide, fact sheet on infection prevention and patient information card has been provided to patient /or left at bedside.    PICC/Midline Placement Documentation        Tonna Boehringer 12/17/2016, 7:17 PM

## 2016-12-17 NOTE — Progress Notes (Addendum)
Patient ID: Jacob [name], male   DOB: 08-13-1955, 61 y.o.   MRN: [id number]    Advanced Heart Failure Clinic Note   PCP: [facility] Primary HF Cardiologist: Dr Gala Romney   HPI: Jacob [name] is a 61 year old [place] male with history of systolic HF EF 20-25% via Echo 08/03/14 s/p INCEPTA ICD implant 6/14, RV dysfunction, CAD s/p CABG x 2 w/ RF MAZE 7/14 at [facility], DM type II, Chronic afib on xarelto, GI bleed 11/28/2014, and HLD.   Admitted the end of August 2016  with increased dyspnea on exertion. Later found to be in cardiogenic shock . At one point on dual pressors milrinone and norepi. Diuresed with IV lasix and transitioned to po lasix. Hospital course complicated by GI bleed and A fib RVR. Loaded on amio but later placed on toprol xl for rate control. On 9/5 had EGD with duodenal bleed with clip applied. Jacob [name] was discharged 9/12 with D/C weight 203 pounds.   Admitted late December 2016 with increased dyspnea. Diuresed with IV lasix and transitioned to po lasix 80 mg twice a day. Chronic A fib and continued on amio 200 mg twice a day. Discharge weight was 205 pounds.   Admitted 10/9 through 01/12/16 with marked volume overload. Diuresed with IV lasix and milrinone. D/C weight 209 pounds. Jacob [name] was provided medications at discharge.   Presents today for follow up as an add on visit. Jacob [name] was visited by paramedicine today and was SOB with little activity, weight was up about 10 pounds. Jacob [name] is SOB with walking into clinic, SOB with ADL's. According to paramedicine, Jacob [name] is taking Jacob [name] medications most of the time. Continues to eat high salt foods, had a can of Pringles beside Jacob [name] bed. Drinking more than 2L a day. Says Jacob [name] has urinated once a day for the past 2 days despite taking 80 mg torsemide BID and metolazone.   SH: Lives in low income housing. Estranged from family ROS: All systems negative except as listed in HPI, PMH and Problem List.  SH:  Social History   Social History  . Marital  status: Divorced    Spouse name: N/A  . Number of children: 3  . Years of education: N/A   Occupational History  . disabled    Social History Main Topics  . Smoking status: Current Every Day Smoker    Packs/day: 0.50    Years: 40.00    Types: Cigarettes  . Smokeless tobacco: Never Used  . Alcohol use No  . Drug use: No  . Sexual activity: No   Other Topics Concern  . Not on file   Social History Narrative   Has an apartment with a roommate. Jacob [name] was living on the streets in 02-07-2013.  Jacob [name] reports that Jacob [name] father died in [place] in 02/07/2013.  Jacob [name] is divorced.  Jacob [name] is no longer estranged from Jacob [name] son, but still from Jacob [name] daughter who lives locally.  Neither of Jacob [name] parents, nor any siblings have any history of CAD.    FH:  Family History  Problem Relation Age of Onset  . Diabetes Mother     Past Medical History:  Diagnosis Date  . Atrial fibrillation (HCC)    RVR 10/2014  . Automatic implantable cardioverter-defibrillator in situ   . CAD (coronary artery disease) Sept 2013   s/p cardiac cath showing occlusion of small RCA with collaterals  . CHF (congestive heart failure) (HCC)    20 to 25 % EF and  RV dysfunction by 07/2014 echo   . Chronic anticoagulation    on xarelto.   . High cholesterol   . Hypertension   . Myocardial infarction (HCC) 2014  . Noncompliance    homelessness contributing.   . Peripheral arterial disease (HCC)   . Type II diabetes mellitus (HCC)     Current Outpatient Prescriptions  Medication Sig Dispense Refill  . ACCU-CHEK SOFTCLIX LANCETS lancets Use for once daily testing of blood sugar 100 each 12  . albuterol (PROVENTIL HFA;VENTOLIN HFA) 108 (90 Base) MCG/ACT inhaler Inhale 2 puffs into the lungs every 6 (six) hours as needed for wheezing or shortness of breath. 1 Inhaler 2  . albuterol (PROVENTIL) (2.5 MG/3ML) 0.083% nebulizer solution Take 3 mLs (2.5 mg total) by nebulization every 6 (six) hours as needed for wheezing or shortness of  breath. 150 mL 1  . amiodarone (PACERONE) 200 MG tablet take 1 TABLET BY MOUTH EVERY DAY every morning 30 tablet 3  . atorvastatin (LIPITOR) 40 MG tablet Take 1 tablet (40 mg total) by mouth daily at 6 PM. 30 tablet 5  . Blood Glucose Monitoring Suppl (ACCU-CHEK AVIVA) device Use as instructed1 times daily before meals 1 each 0  . cetirizine (ZYRTEC) 10 MG tablet Take 1 tablet (10 mg total) by mouth daily. 30 tablet 1  . digoxin (LANOXIN) 0.125 MG tablet Take 0.5 tablets (0.0625 mg total) by mouth daily. 14 tablet 2  . ferrous sulfate 325 (65 FE) MG tablet TAKE 1 TABLET BY MOUTH 3 TIMES DAILY WITH MEALS. 90 tablet 2  . gabapentin (NEURONTIN) 300 MG capsule Take 1 capsule (300 mg total) by mouth 2 (two) times daily. 60 capsule 5  . glipiZIDE (GLUCOTROL) 10 MG tablet Take 10 mg by mouth daily before breakfast.    . glucose blood (ACCU-CHEK AVIVA) test strip Use as instructed for 1 times daily testing of blood sugar 100 each 12  . hydrALAZINE (APRESOLINE) 25 MG tablet take 1 TABLET BY MOUTH THREE TIMES DAILY every morning, noon,evening 90 tablet 3  . Insulin Glargine (LANTUS) 100 UNIT/ML Solostar Pen Inject 25 Units into the skin daily at 10 pm. 4 pen 5  . Insulin Pen Needle 31G X 5 MM MISC Use as directed for once daily insulin injection 100 each 2  . isosorbide mononitrate (IMDUR) 30 MG 24 hr tablet Take 1 tablet (30 mg total) by mouth daily. 30 tablet 3  . Lancet Devices (ACCU-CHEK SOFTCLIX) lancets Use as instructed for once daily testing of blood sugar 1 each 0  . meclizine (ANTIVERT) 25 MG tablet Take 25 mg by mouth 2 (two) times daily as needed for nausea.    Marland Kitchen spironolactone (ALDACTONE) 25 MG tablet TAKE 1 TABLET BY MOUTH DAILY. 90 tablet 4  . torsemide (DEMADEX) 20 MG tablet Take 4 tablets (80 mg total) by mouth 2 (two) times daily. 240 tablet 3  . XARELTO 20 MG TABS tablet take 1 TABLET BY MOUTH before supper in the evening 28 tablet 2  . metolazone (ZAROXOLYN) 2.5 MG tablet Take 1 tablet  once as directed by CHF clinic. (Patient not taking: Reported on 12/17/2016) 3 tablet 0   No current facility-administered medications for this encounter.     Vitals:   12/17/16 1354  SpO2: 94%  Weight: 254 lb 4 oz (115.3 kg)   Orthostatic vitals 92/60 sitting 80/54 standing.   HR 114.  EKG - Afib rate 105 bpm.    Wt Readings from Last 3 Encounters:  12/17/16  254 lb 4 oz (115.3 kg)  12/17/16 248 lb (112.5 kg)  12/04/16 243 lb (110.2 kg)      PHYSICAL EXAM: General: Ill appearing male. No resp difficulty. HEENT: Normal Neck: Supple. JVP to ear. Carotids 2+ bilat; no bruits. No thyromegaly or nodule noted. Cor: PMI nondisplaced. Tachy, regular, No M/G/R noted Lungs: Diminished in lower lobes. Clear in bilateral upper lobes.  Abdomen: Soft, non-tender, ++ distended, no HSM. No bruits or masses. +BS  Extremities: No cyanosis, clubbing, rash, Trace pretibial edema bilaterally.  Neuro: Alert & orientedx3, cranial nerves grossly intact. moves all 4 extremities w/o difficulty. Affect pleasant   ASSESSMENT & PLAN: 1.Acute on Chronic Systolic Heart Failure: Echo 10/2015 EF 25-30%. Has [employer] ICD.  - NYHA III - Volume elevated on exam in the setting of dietary non complinace.  - Orthostatic and dizzy. Will admit for IV diuresis and likely milrinone. Stat BMET now.  - Hold Cleda Daub with hypotension - Continue digoxin 0.0625 mg daily.  - Hold hydralazine with hypotension.   - No beta blocker with acute decompensation although Jacob [name] is not currently on one.   2. Chronic A fib - Rate controlled. On Amiodarone 200 mg daily.  - Continue Xarelto for anticoagulation - Denies melena and hematochezia. CBC today.   3. H/O GI Bleed - duodenal ulcer with clip 11/2003 - CBC today.    4. PAD- Last ABI 2015  - R EIA stent with known bilateral SFA occlusion.  - Follows with Dr. Allyson Sabal: "Jacob [name] is not a candidate for endovascular therapy of Jacob [name] SFAs and would require femoropopliteal bypass  grafting which I think Jacob [name] would be high risk for given Jacob [name] cardiac situation" - No change.  - Continues to smoke.   5. Social  Issues - Resides in low income housing.  - Nurse, adult.   6. Smoking - Encouraged cessation  7. HTN - Now hypotensive.   Admit as above.    Little Ishikawa, NP  3:07 PM

## 2016-12-17 NOTE — Progress Notes (Signed)
Paramedicine Encounter    Patient ID: Jacob [name], male    DOB: November 30, 1955, 61 y.o.   MRN: [id number]    Patient Care Team: [name], MD as PCP - General (Internal Medicine) Clarisa Schools, RN as Registered Nurse Jena Gauss, Gerrit Friends, MD as Consulting Physician (Gastroenterology)  Patient Active Problem List   Diagnosis Date Noted  . Hyperglycemia 12/02/2016  . Acute pain of right knee 12/02/2016  . Congestive heart disease (HCC) 01/01/2016  . Diabetic neuropathy (HCC) 11/28/2015  . Ascites 11/09/2015  . CHF (congestive heart failure) (HCC) 11/09/2015  . Insomnia 04/18/2015  . Claudication of right lower extremity (HCC) 04/07/2015  . Cardiomyopathy, ischemic 04/07/2015  . Chronic anticoagulation 03/30/2015  . Cardiorenal syndrome with renal failure   . SOB (shortness of breath)   . Morbid obesity due to excess calories (HCC)   . Helicobacter pylori ab+ 12/12/2014  . Calf pain   . Duodenal ulcer with hemorrhage   . Hemorrhagic shock (HCC)   . Cardiogenic shock (HCC)   . Diabetes mellitus (HCC)   . Coronary artery disease involving native coronary artery of native heart without angina pectoris   . Atrial fibrillation with rapid ventricular response (HCC) 10/29/2014  . Chest pain   . Atherosclerosis of native arteries of extremity with intermittent claudication (HCC) 09/06/2014  . Atrial fibrillation with controlled ventricular response (HCC) 08/03/2014  . Bacteremia   . DM (diabetes mellitus), type 2 with peripheral vascular complications (HCC) 08/01/2014  . Anemia of chronic disease 08/01/2014  . PVD (peripheral vascular disease) (HCC) 10/29/2013  . Tobacco abuse 05/13/2013  . AF (atrial fibrillation) (HCC) 02/11/2013  . ICD - in place- BS May 2014 [facility] 02/11/2013  . Microcytic anemia 01/11/2013  . CAD- s/p CABG July 2014 [facility] 12/04/2011  . Noncompliance 12/01/2011  . Essential hypertension 10/30/2006    Current Outpatient Prescriptions:  .   ACCU-CHEK SOFTCLIX LANCETS lancets, Use for once daily testing of blood sugar, Disp: 100 each, Rfl: 12 .  acetaminophen-codeine (TYLENOL #4) 300-60 MG tablet, Take 1 tablet by mouth every 4 (four) hours as needed for moderate pain., Disp: 60 tablet, Rfl: 0 .  albuterol (PROVENTIL HFA;VENTOLIN HFA) 108 (90 Base) MCG/ACT inhaler, Inhale 2 puffs into the lungs every 6 (six) hours as needed for wheezing or shortness of breath., Disp: 1 Inhaler, Rfl: 2 .  albuterol (PROVENTIL) (2.5 MG/3ML) 0.083% nebulizer solution, Take 3 mLs (2.5 mg total) by nebulization every 6 (six) hours as needed for wheezing or shortness of breath., Disp: 150 mL, Rfl: 1 .  amiodarone (PACERONE) 200 MG tablet, take 1 TABLET BY MOUTH EVERY DAY every morning, Disp: 30 tablet, Rfl: 3 .  atorvastatin (LIPITOR) 40 MG tablet, Take 1 tablet (40 mg total) by mouth daily at 6 PM., Disp: 30 tablet, Rfl: 5 .  Blood Glucose Monitoring Suppl (ACCU-CHEK AVIVA) device, Use as instructed1 times daily before meals, Disp: 1 each, Rfl: 0 .  cetirizine (ZYRTEC) 10 MG tablet, Take 1 tablet (10 mg total) by mouth daily., Disp: 30 tablet, Rfl: 1 .  digoxin (LANOXIN) 0.125 MG tablet, Take 0.5 tablets (0.0625 mg total) by mouth daily., Disp: 14 tablet, Rfl: 2 .  ferrous sulfate 325 (65 FE) MG tablet, TAKE 1 TABLET BY MOUTH 3 TIMES DAILY WITH MEALS., Disp: 90 tablet, Rfl: 2 .  gabapentin (NEURONTIN) 300 MG capsule, Take 1 capsule (300 mg total) by mouth 2 (two) times daily., Disp: 60 capsule, Rfl: 5 .  glipiZIDE (GLUCOTROL) 10  MG tablet, TAKE 1 TABLET BY MOUTH 2 TIMES DAILY BEFORE A MEAL., Disp: 60 tablet, Rfl: 5 .  glucose blood (ACCU-CHEK AVIVA) test strip, Use as instructed for 1 times daily testing of blood sugar, Disp: 100 each, Rfl: 12 .  hydrALAZINE (APRESOLINE) 25 MG tablet, take 1 TABLET BY MOUTH THREE TIMES DAILY every morning, noon,evening, Disp: 90 tablet, Rfl: 3 .  Insulin Glargine (LANTUS) 100 UNIT/ML Solostar Pen, Inject 25 Units into the skin  daily at 10 pm., Disp: 4 pen, Rfl: 5 .  Insulin Pen Needle 31G X 5 MM MISC, Use as directed for once daily insulin injection, Disp: 100 each, Rfl: 2 .  isosorbide mononitrate (IMDUR) 30 MG 24 hr tablet, Take 1 tablet (30 mg total) by mouth daily., Disp: 30 tablet, Rfl: 3 .  lactulose (CHRONULAC) 10 GM/15ML solution, Take 15 mLs (10 g total) by mouth 2 (two) times daily. (Patient not taking: Reported on 10/15/2016), Disp: 946 mL, Rfl: 1 .  Lancet Devices (ACCU-CHEK SOFTCLIX) lancets, Use as instructed for once daily testing of blood sugar, Disp: 1 each, Rfl: 0 .  meclizine (ANTIVERT) 25 MG tablet, Take 25 mg by mouth 2 (two) times daily as needed for nausea., Disp: , Rfl:  .  metolazone (ZAROXOLYN) 2.5 MG tablet, Take 1 tablet once as directed by CHF clinic., Disp: 3 tablet, Rfl: 0 .  potassium chloride SA (KLOR-CON M20) 20 MEQ tablet, Take 40 meq (2 tabs) once today and once tomorrow., Disp: 4 tablet, Rfl: 0 .  spironolactone (ALDACTONE) 25 MG tablet, TAKE 1 TABLET BY MOUTH DAILY., Disp: 90 tablet, Rfl: 4 .  torsemide (DEMADEX) 20 MG tablet, Take 4 tablets (80 mg total) by mouth 2 (two) times daily., Disp: 240 tablet, Rfl: 3 .  XARELTO 20 MG TABS tablet, take 1 TABLET BY MOUTH before supper in the evening, Disp: 28 tablet, Rfl: 2 Allergies  Allergen Reactions  . Tape Itching    Paper tape please.     Social History   Social History  . Marital status: Divorced    Spouse name: N/A  . Number of children: 3  . Years of education: N/A   Occupational History  . disabled    Social History Main Topics  . Smoking status: Current Every Day Smoker    Packs/day: 0.50    Years: 40.00    Types: Cigarettes  . Smokeless tobacco: Never Used  . Alcohol use No  . Drug use: No  . Sexual activity: No   Other Topics Concern  . Not on file   Social History Narrative   Has an apartment with a roommate. He was living on the streets in 01-31-2013.  He reports that his father died in [place] in  2013/01/31.  He is divorced.  He is no longer estranged from his son, but still from his daughter who lives locally.  Neither of his parents, nor any siblings have any history of CAD.    Physical Exam  Pulmonary/Chest: He is in respiratory distress. He has no wheezes. He has no rales.  Abdominal: He exhibits distension. There is no tenderness. There is no guarding.  Musculoskeletal: He exhibits no edema.  Skin: Skin is warm and dry. He is not diaphoretic.        Future Appointments Date Time Provider Department Center  02/11/2017 3:00 PM Jaclyn Shaggy, MD [facility] None    ATF pt CAO x4 sitting on the side of the bed c/o not feeling good. Pt stated  that he has sob which has gotten worse within the past few days. He has trouble sleeping at night due to the sob.  Pt's weight has slowly increased from 239 (8/29) to 248 today.  He hasn't been able to work due to the pain in his legs.  His PCP gave him pain rx for the leg pain earlier this month pt stated that "it doesn't work".  Pt has increased pain in his legs while walking from.  Pt is still receiving his med pack from [facility] family pharmacy, which he stated that he is taking regularly without missing.  Pt has potatoe chips beside his bed and pepsi in the trash.  [facility] clinic called and advised of pt's cc.  rx pill packs checked.   Pt now has a130 appointment with heart clinic due to complaints   BP 138/70   Pulse 96   Resp 16   Wt 248 lb (112.5 kg)   SpO2 96%   BMI 41.27 kg/m   REDS clip @ 37% cbg @ 374 Weight yesterday-248 Last visit weight-243 (8/29)   Shania Bjelland, EMT Paramedic 12/17/2016    ACTION: Home visit completed

## 2016-12-18 ENCOUNTER — Inpatient Hospital Stay (HOSPITAL_COMMUNITY): Payer: Medicare Other

## 2016-12-18 DIAGNOSIS — I482 Chronic atrial fibrillation: Secondary | ICD-10-CM

## 2016-12-18 DIAGNOSIS — I5023 Acute on chronic systolic (congestive) heart failure: Secondary | ICD-10-CM

## 2016-12-18 LAB — ECHOCARDIOGRAM COMPLETE
Height: 63 in
Weight: 4046.4 oz

## 2016-12-18 LAB — COOXEMETRY PANEL
CARBOXYHEMOGLOBIN: 7 % — AB (ref 0.5–1.5)
Methemoglobin: 0.8 % (ref 0.0–1.5)
O2 SAT: 60.4 %
Total hemoglobin: 12.4 g/dL (ref 12.0–16.0)

## 2016-12-18 LAB — GLUCOSE, CAPILLARY
GLUCOSE-CAPILLARY: 215 mg/dL — AB (ref 65–99)
GLUCOSE-CAPILLARY: 176 mg/dL — AB (ref 65–99)
Glucose-Capillary: 148 mg/dL — ABNORMAL HIGH (ref 65–99)
Glucose-Capillary: 226 mg/dL — ABNORMAL HIGH (ref 65–99)

## 2016-12-18 LAB — BASIC METABOLIC PANEL
Anion gap: 9 (ref 5–15)
BUN: 20 mg/dL (ref 6–20)
CO2: 30 mmol/L (ref 22–32)
Calcium: 8 mg/dL — ABNORMAL LOW (ref 8.9–10.3)
Chloride: 96 mmol/L — ABNORMAL LOW (ref 101–111)
Creatinine, Ser: 1.25 mg/dL — ABNORMAL HIGH (ref 0.61–1.24)
GFR calc non Af Amer: >60 mL/min (ref >60)
Glucose, Bld: 134 mg/dL — ABNORMAL HIGH (ref 65–99)
Potassium: 3.8 mmol/L (ref 3.5–5.1)
SODIUM: 135 mmol/L (ref 135–145)

## 2016-12-18 LAB — URIC ACID: Uric Acid, Serum: 9.6 mg/dL — ABNORMAL HIGH (ref 4.4–7.6)

## 2016-12-18 MED ORDER — PERFLUTREN LIPID MICROSPHERE
1.0000 mL | INTRAVENOUS | Status: AC | PRN
  Administered 2016-12-18: 2 mL via INTRAVENOUS
  Filled 2016-12-18: qty 10

## 2016-12-18 MED ORDER — POTASSIUM CHLORIDE CRYS ER 20 MEQ PO TBCR
40.0000 meq | EXTENDED_RELEASE_TABLET | Freq: Two times a day (BID) | ORAL | Status: DC
  Administered 2016-12-18 – 2016-12-21 (×6): 40 meq via ORAL
  Filled 2016-12-18 (×7): qty 2

## 2016-12-18 MED ORDER — PREDNISONE 20 MG PO TABS
40.0000 mg | ORAL_TABLET | Freq: Every day | ORAL | Status: AC
  Administered 2016-12-18 – 2016-12-20 (×3): 40 mg via ORAL
  Filled 2016-12-18 (×3): qty 2

## 2016-12-18 MED ORDER — FUROSEMIDE 10 MG/ML IJ SOLN
15.0000 mg/h | INTRAVENOUS | Status: DC
  Administered 2016-12-18 – 2016-12-21 (×5): 15 mg/h via INTRAVENOUS
  Filled 2016-12-18 (×7): qty 25

## 2016-12-18 NOTE — Progress Notes (Signed)
Inpatient Diabetes Program Recommendations  AACE/ADA: New Consensus Statement on Inpatient Glycemic Control (2015)  Target Ranges:  Prepandial:   less than 140 mg/dL      Peak postprandial:   less than 180 mg/dL (1-2 hours)      Critically ill patients:  140 - 180 mg/dL   Results for Jacob [name], Jacob [name] (MRN [id number]) as of 12/18/2016 09:44  Ref. Range 12/17/2016 16:47 12/17/2016 21:39 12/18/2016 07:59  Glucose-Capillary Latest Ref Range: 65 - 99 mg/dL 169 (H) 678 (H) 938 (H)   Review of Glycemic Control  Diabetes history: DM2 Outpatient Diabetes medications: Glipizide 10 mg QAM, Lantus 25 units QHS Current orders for Inpatient glycemic control: Lantus 25 units QHS, Novolog 0-15 units TID with meals  Inpatient Diabetes Program Recommendations:  Insulin - Basal: Please consider increasing Lantus to 27 units QHS. Correction (SSI): Please consider ordering Novolog 0-5 units QHS for bedtime correction. HgbA1C: A1C 8.5% on 12/17/16 indicating an average glucose of 197 mg/dl over the past 2-3 months.  Thanks, Orlando Penner, RN, MSN, CDE Diabetes Coordinator Inpatient Diabetes Program [phone] (Team Pager from 8am to 5pm)

## 2016-12-18 NOTE — Progress Notes (Signed)
Patient well known from previous hospitalizations and interactions in the [facility] Clinic.  He remains enrolled in the HF Community Paramedicine program--however is often difficult to make contact with or schedule home visits.  He will continue in the HF Community Paramedicine program after discharge due to high risk for readmission secondary to poor compliance and insight into disease process.

## 2016-12-18 NOTE — Addendum Note (Signed)
Encounter addended by: Chyrl Civatte, RN on: 12/18/2016 10:40 AM<BR>    Actions taken: Order list changed, Diagnosis association updated

## 2016-12-18 NOTE — Progress Notes (Signed)
Advanced Heart Failure Rounding Note  PCP:  Primary Cardiologist: Dr Leory Plowman   Subjective:    Admitted with marked volume overload. Diuresing with 120 IV lasix twice daily.  Weight trending down 6 pounds but sluggish urine output.  Complaining of R knee pain. Says he fell 1 month ago. SOB with exertion.    Objective:   Weight Range: 252 lb 14.4 oz (114.7 kg) Body mass index is 44.8 kg/m.   Vital Signs:   Temp:  [98 F (36.7 C)-98.5 F (36.9 C)] 98.2 F (36.8 C) (09/26 0745) Pulse Rate:  [90-115] 90 (09/26 0745) Resp:  [16-18] 18 (09/26 0745) BP: (101-138)/(62-75) 109/70 (09/26 0745) SpO2:  [92 %-96 %] 94 % (09/26 0745) FiO2 (%):  [21 %] 21 % (09/25 1622) Weight:  [248 lb (112.5 kg)-258 lb 1.6 oz (117.1 kg)] 252 lb 14.4 oz (114.7 kg) (09/26 0527) Last BM Date: 12/16/16 (reported last BM "two nights ago")  Weight change: Filed Weights   12/17/16 1629 12/18/16 0527  Weight: 258 lb 1.6 oz (117.1 kg) 252 lb 14.4 oz (114.7 kg)    Intake/Output:   Intake/Output Summary (Last 24 hours) at 12/18/16 0823 Last data filed at 12/18/16 0530  Gross per 24 hour  Intake              325 ml  Output              750 ml  Net             -425 ml      Physical Exam    General:  Well appearing. No resp difficulty. Sitting in the chair HEENT: Normal Neck: Supple. JVP to jaw . Carotids 2+ bilat; no bruits. No lymphadenopathy or thyromegaly appreciated. Cor: PMI nondisplaced. Irregular rate & rhythm. No rubs, gallops or murmurs. Lungs: Decreased in the bases. On room air.  Abdomen: Soft, nontender, nondistended. No hepatosplenomegaly. No bruits or masses. Good bowel sounds. Extremities: No cyanosis, clubbing, rash, R and LLE 1-2+  Edema. RUE PICC Neuro: Alert & orientedx3, cranial nerves grossly intact. moves all 4 extremities w/o difficulty. Affect pleasant   Telemetry    A fib 100s   EKG   N/A   Labs    CBC  Recent Labs  12/17/16 1430  WBC 10.9*  HGB 12.8*    HCT 40.3  MCV 86.9  PLT 220   Basic Metabolic Panel  Recent Labs  12/17/16 1430 12/18/16 0511  NA 132* 135  K 3.1* 3.8  CL 96* 96*  CO2 26 30  GLUCOSE 214* 134*  BUN 19 20  CREATININE 1.39* 1.25*  CALCIUM 8.2* 8.0*  MG 1.7  --    Liver Function Tests  Recent Labs  12/17/16 1430  AST 21  ALT 19  ALKPHOS 137*  BILITOT 0.9  PROT 7.1  ALBUMIN 3.3*   No results for input(s): LIPASE, AMYLASE in the last 72 hours. Cardiac Enzymes No results for input(s): CKTOTAL, CKMB, CKMBINDEX, TROPONINI in the last 72 hours.  BNP: BNP (last 3 results)  Recent Labs  02/09/16 1210 02/19/16 1608 12/17/16 1430  BNP 323.1* 303.2* 394.2*    ProBNP (last 3 results) No results for input(s): PROBNP in the last 8760 hours.   D-Dimer No results for input(s): DDIMER in the last 72 hours. Hemoglobin A1C  Recent Labs  12/17/16 1702  HGBA1C 8.5*   Fasting Lipid Panel No results for input(s): CHOL, HDL, LDLCALC, TRIG, CHOLHDL, LDLDIRECT in the last 72 hours.  Thyroid Function Tests  Recent Labs  12/17/16 1430  TSH 2.269    Other results:   Imaging    Dg Chest Port 1 View  Result Date: 12/17/2016 CLINICAL DATA:  Confirm line placement EXAM: PORTABLE CHEST 1 VIEW COMPARISON:  01/11/2016 FINDINGS: Right PICC line tip is seen in the mid SVC. ICD device projects over the left axilla with lead in the right ventricle. There is stable cardiomegaly with median sternotomy sutures in place. Aortic atherosclerosis is noted without aneurysm. Blunting the right costophrenic angle laterally is consistent with a small to moderate pleural effusion. Interstitial edema is noted. No pneumonic consolidations. IMPRESSION: 1. Right-sided PICC line tip is noted in the mid SVC. 2. Cardiomegaly with aortic atherosclerosis and mild interstitial edema. Small to moderate right pleural effusion blunting the right lateral costophrenic angle. 3. ICD device in place with lead in the right ventricle.  Electronically Signed   By: Tollie Eth M.D.   On: 12/17/2016 19:49      Medications:     Scheduled Medications: . amiodarone  200 mg Oral Daily  . atorvastatin  40 mg Oral q1800  . digoxin  0.0625 mg Oral Daily  . [START ON 12/19/2016] ferrous sulfate  325 mg Oral Q breakfast  . gabapentin  300 mg Oral BID  . Influenza vac split quadrivalent PF  0.5 mL Intramuscular Tomorrow-1000  . insulin aspart  0-15 Units Subcutaneous TID WC  . insulin glargine  25 Units Subcutaneous Q2200  . loratadine  10 mg Oral Daily  . potassium chloride  40 mEq Oral TID  . rivaroxaban  20 mg Oral Q supper  . sodium chloride flush  3 mL Intravenous Q12H     Infusions: . sodium chloride 250 mL (12/17/16 2219)  . furosemide    . milrinone 0.25 mcg/kg/min (12/18/16 0644)     PRN Medications:  sodium chloride, acetaminophen, albuterol, ALPRAZolam, ondansetron (ZOFRAN) IV, oxyCODONE-acetaminophen, sodium chloride flush, sodium chloride flush    Patient Profile    Mr. Jacob [name] is a 61 year old [place] male with history of systolic HF EF 20-25% via Echo 10/2015 s/p INCEPTA ICD implant 6/14, RV dysfunction, CAD s/p CABG x 2 w/ RF MAZE 7/14 at [facility], DM type II, Chronic afib on xarelto, GI bleed 11/28/2014, and HLD.  Admitted with marked volume overload. Assessment/Plan   1.A/C Systolic Heart Failure- ICM Repeat ECHO pending. Has ICD  Todays CO-OX is 60% on milrinone 0.25 mcg. Renal function stable.  Volume status remains elevated. ~10-15 pounds up from baseline. Sluggish urine output.  Change to lasix drip 15 mg per hour.  2.Chronic A fib - on amio for rate control. On xarelto. Hgb stable on admit 12.6. Follow daily.  3. H/O GI bleed- had duodenal ulcer clip 2016 4. PAD-followed by Dr Allyson Sabal. Known SFA occlusion. Not a candidate for endovascular therapy. On statin and xarelto. 5. Smoker - counseled to stop.  7. CAD- CABG x2 2014. On statin and xarelto. No S/S ischemia 8. R Knee Pain- Had a fall 1  month ago. check uric acid. May need steroids.   He remains difficult to manage in the community given ongoing noncompliance.Continue HF Paramedicine once discharged.    Length of Stay: 1   Amy Clegg, NP  12/18/2016, 8:23 AM  Advanced Heart Failure Team Pager [phone] (M-F; 7a - 4p)  Please contact [facility] for night-coverage after hours (4p -7a ) and weekends on [email or web address]   Patient seen and examined with Tonye Becket, NP. We  discussed all aspects of the encounter. I agree with the assessment and plan as stated above.   He remains tenuous but co-ox improved on IV milrinone. Will continue milrinone and IV diuresis. Increase lasix to 15/hour.  Echo reviewed personally and EF 30-35%. (improved from previous). Continue xarelto for AF.   Knee pain concerning for gout. Check uric acid. Start steroids and colchicine and assess response. May need knee films.   Arvilla Meres, MD  3:42 PM

## 2016-12-18 NOTE — Progress Notes (Signed)
  Echocardiogram 2D Echocardiogram has been performed.  Celene Skeen 12/18/2016, 11:49 AM

## 2016-12-18 NOTE — Progress Notes (Signed)
  Echocardiogram 2D Echocardiogram has been performed.  Jacob [name] 12/18/2016, 11:46 AM

## 2016-12-18 NOTE — Progress Notes (Addendum)
CRITICAL VALUE ALERT  Critical Value:  Carboxy Hemoglobin  Date & Time Notied:  12/18/2016 5:50am  Provider Notified: Chakravatti  Orders Received/Actions taken:

## 2016-12-18 NOTE — Care Management Note (Signed)
Case Management Note  Patient Details  Name: Jacob [name] MRN: [id number] Date of Birth: 08/26/1955  Subjective/Objective:     CHF              Action/Plan: Patient lives at home; goes to the [facility] for follow up care and the Heart Failure Team; has private insurance with Medicare; CM will continue to follow for DCP.  Expected Discharge Date:  12/20/16               Expected Discharge Plan:  Home/Self Care  In-House Referral:   [facility]  Discharge planning Services  CM Consult  Status of Service:  In process, will continue to follow  Reola Mosher [phone] 12/18/2016, 11:53 AM

## 2016-12-19 ENCOUNTER — Inpatient Hospital Stay (HOSPITAL_COMMUNITY): Payer: Medicare Other

## 2016-12-19 DIAGNOSIS — M25561 Pain in right knee: Secondary | ICD-10-CM

## 2016-12-19 LAB — GLUCOSE, CAPILLARY
GLUCOSE-CAPILLARY: 371 mg/dL — AB (ref 65–99)
GLUCOSE-CAPILLARY: 304 mg/dL — AB (ref 65–99)
GLUCOSE-CAPILLARY: 222 mg/dL — AB (ref 65–99)
Glucose-Capillary: 261 mg/dL — ABNORMAL HIGH (ref 65–99)

## 2016-12-19 LAB — BASIC METABOLIC PANEL
ANION GAP: 10 (ref 5–15)
BUN: 18 mg/dL (ref 6–20)
CHLORIDE: 93 mmol/L — AB (ref 101–111)
CO2: 28 mmol/L (ref 22–32)
CREATININE: 1.19 mg/dL (ref 0.61–1.24)
Calcium: 8.2 mg/dL — ABNORMAL LOW (ref 8.9–10.3)
GFR calc non Af Amer: >60 mL/min (ref >60)
Glucose, Bld: 286 mg/dL — ABNORMAL HIGH (ref 65–99)
POTASSIUM: 4.4 mmol/L (ref 3.5–5.1)
SODIUM: 131 mmol/L — AB (ref 135–145)

## 2016-12-19 LAB — COOXEMETRY PANEL
Carboxyhemoglobin: 3.6 % — ABNORMAL HIGH (ref 0.5–1.5)
Methemoglobin: 0.7 % (ref 0.0–1.5)
O2 Saturation: 62 %
TOTAL HEMOGLOBIN: 13.1 g/dL (ref 12.0–16.0)

## 2016-12-19 LAB — POCT ABI - SCREENING FOR PILOT NO CHARGE

## 2016-12-19 LAB — DIGOXIN LEVEL

## 2016-12-19 MED ORDER — ISOSORBIDE MONONITRATE ER 30 MG PO TB24
30.0000 mg | ORAL_TABLET | Freq: Every day | ORAL | Status: DC
  Administered 2016-12-19 – 2016-12-21 (×3): 30 mg via ORAL
  Filled 2016-12-19 (×3): qty 1

## 2016-12-19 MED ORDER — HYDRALAZINE HCL 25 MG PO TABS
12.5000 mg | ORAL_TABLET | Freq: Three times a day (TID) | ORAL | Status: DC
  Administered 2016-12-19 – 2016-12-20 (×6): 12.5 mg via ORAL
  Filled 2016-12-19 (×7): qty 1

## 2016-12-19 MED ORDER — INSULIN GLARGINE 100 UNIT/ML ~~LOC~~ SOLN
28.0000 [IU] | Freq: Every day | SUBCUTANEOUS | Status: DC
  Administered 2016-12-19 – 2016-12-20 (×2): 28 [IU] via SUBCUTANEOUS
  Filled 2016-12-19 (×2): qty 0.28

## 2016-12-19 NOTE — Hospital Discharge Follow-Up (Signed)
The patient is known to the [facility]. Will follow hospital course and schedule follow up appointment as needed

## 2016-12-19 NOTE — Progress Notes (Signed)
Advanced Heart Failure Rounding Note  PCP:  Primary Cardiologist: Dr Leory Plowman   Subjective:    Admitted with marked volume overload.   Yesterday switched to lasix drip 15 mg hour and continued on milrinone 0.25 mcg.Improved urine output. Weight down 1 pound. Also started on colchicine and prednisone for gout. Urinc Acid 9.6.  Continues to complain of R knee pain. Says he cant walk due to pain. Denies SOB.   Objective:   Weight Range: 251 lb 11.2 oz (114.2 kg) Body mass index is 44.59 kg/m.   Vital Signs:   Temp:  [97.5 F (36.4 C)-98.2 F (36.8 C)] 97.5 F (36.4 C) (09/27 0507) Pulse Rate:  [91-98] 98 (09/27 0507) Resp:  [16-18] 18 (09/27 0507) BP: (105-125)/(45-74) 125/74 (09/27 0507) SpO2:  [95 %-97 %] 96 % (09/27 0507) Weight:  [251 lb 11.2 oz (114.2 kg)] 251 lb 11.2 oz (114.2 kg) (09/27 0507) Last BM Date: 12/17/16  Weight change: Filed Weights   12/17/16 1629 12/18/16 0527 12/19/16 0507  Weight: 258 lb 1.6 oz (117.1 kg) 252 lb 14.4 oz (114.7 kg) 251 lb 11.2 oz (114.2 kg)    Intake/Output:   Intake/Output Summary (Last 24 hours) at 12/19/16 0812 Last data filed at 12/19/16 0508  Gross per 24 hour  Intake             1430 ml  Output             2875 ml  Net            -1445 ml      Physical Exam    General:  Well appearing. No resp difficulty. Sitting in the chair.  HEENT: normal Neck: supple. JVP to jaw.. Carotids 2+ bilat; no bruits. No lymphadenopathy or thryomegaly appreciated. Cor: PMI nondisplaced. Irregular  rate & rhythm. No rubs, gallops or murmurs. Lungs: clear on room air.  Abdomen: soft, nontender, nondistended. No hepatosplenomegaly. No bruits or masses. Good bowel sounds. Extremities: no cyanosis, clubbing, rash, R and LLE 1+ edema. RUE PICC Neuro: alert & orientedx3, cranial nerves grossly intact. moves all 4 extremities w/o difficulty. Affect pleasant   Telemetry    A fib 100s   EKG   N/A   Labs    CBC  Recent Labs  12/17/16 1430  WBC 10.9*  HGB 12.8*  HCT 40.3  MCV 86.9  PLT 220   Basic Metabolic Panel  Recent Labs  12/17/16 1430 12/18/16 0511 12/19/16 0637  NA 132* 135 131*  K 3.1* 3.8 4.4  CL 96* 96* 93*  CO2 26 30 28   GLUCOSE 214* 134* 286*  BUN 19 20 18   CREATININE 1.39* 1.25* 1.19  CALCIUM 8.2* 8.0* 8.2*  MG 1.7  --   --    Liver Function Tests  Recent Labs  12/17/16 1430  AST 21  ALT 19  ALKPHOS 137*  BILITOT 0.9  PROT 7.1  ALBUMIN 3.3*   No results for input(s): LIPASE, AMYLASE in the last 72 hours. Cardiac Enzymes No results for input(s): CKTOTAL, CKMB, CKMBINDEX, TROPONINI in the last 72 hours.  BNP: BNP (last 3 results)  Recent Labs  02/09/16 1210 02/19/16 1608 12/17/16 1430  BNP 323.1* 303.2* 394.2*    ProBNP (last 3 results) No results for input(s): PROBNP in the last 8760 hours.   D-Dimer No results for input(s): DDIMER in the last 72 hours. Hemoglobin A1C  Recent Labs  12/17/16 1702  HGBA1C 8.5*   Fasting Lipid Panel No results for  input(s): CHOL, HDL, LDLCALC, TRIG, CHOLHDL, LDLDIRECT in the last 72 hours. Thyroid Function Tests  Recent Labs  12/17/16 1430  TSH 2.269    Other results:   Imaging    No results found.   Medications:     Scheduled Medications: . amiodarone  200 mg Oral Daily  . atorvastatin  40 mg Oral q1800  . digoxin  0.0625 mg Oral Daily  . ferrous sulfate  325 mg Oral Q breakfast  . gabapentin  300 mg Oral BID  . insulin aspart  0-15 Units Subcutaneous TID WC  . insulin glargine  25 Units Subcutaneous Q2200  . loratadine  10 mg Oral Daily  . potassium chloride  40 mEq Oral BID  . predniSONE  40 mg Oral Q breakfast  . rivaroxaban  20 mg Oral Q supper  . sodium chloride flush  3 mL Intravenous Q12H    Infusions: . sodium chloride 250 mL (12/17/16 2219)  . furosemide (LASIX) infusion 15 mg/hr (12/19/16 0446)  . 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  Admitted with marked volume overload. Assessment/Plan   1.A/C Systolic Heart Failure- ICM Repeat ECHO pending. Has ICD  Todays CO-OXis 62% on milrinone 0.25 mcg. Renal function stable.  Continue lasix drip at 15 mg per hour. Improved urine output noted. No bb with low output. Continue digoxin.  Add 12.5 mg hydralazine every 8 hours +imdur 30 mg daily.  2.Chronic A fib - on amio for rate control. On xarelto. Hgb stable on admit 12.6. Check CBC in am.  3. H/O GI bleed- had duodenal ulcer clip 2016 4. PAD-followed by Dr Allyson Sabal. Known SFA occlusion. Not a candidate for endovascular therapy. On statin and xarelto. 5. Smoker - counseled to stop.  6. CAD- CABG x2 2014. On statin and xarelto. No S/S ischemia.  7. Acute Gout: Uric Acid 9.6. Started prednisone and colchicine. 8. R Knee Pain. Had a fall 1 month ago. No relief with steroids/colchicine. Xray R knee.  9.Uncontrolled Diabetes- Diabetes Coordinator recommendations appreciated. Increase night time lantus.   He is not a candidate for home milrinone due to noncompliance.He remains difficult to manage in the community given ongoing noncompliance.Continue HF Paramedicine once discharged.    Length of Stay: 2   Tonye Becket, NP  12/19/2016, 8:12 AM  Advanced Heart Failure Team Pager [phone] (M-F; 7a - 4p)  Please contact [facility] for night-coverage after hours (4p -7a ) and weekends on [email or web address]  Patient seen and examined with Tonye Becket, NP. We discussed all aspects of the encounter. I agree with the assessment and plan as stated  above.   Remains volume overloaded. Diuresis slightly improved on lasix gtt at 15. Can increase to 20/hr as needed. Continue milrinone for inotropic support. Renal function improving.   Has persistent R knee pain. Minimal response to steroids and colchicine. Knee films reviewed personally. No acute fracture. Will have PT see.   DM remains poorly controlled. Appreciate Diabetes Coordinator recs.   Arvilla Meres, MD  1:35 PM

## 2016-12-19 NOTE — Consult Note (Signed)
   [facility] CM Inpatient Consult   12/19/2016  Wendell Wolle Sep 20, 1955 [id number]   Patient screened for potential [facility] Care Management services. Patient is in the [facility] Care Management services under patient's Medicare /ACO plan.  Patient is active with the Advanced HF clinic and being followed by para medicine.   Please place a [facility] Care Management consult or for questions contact:   Charlesetta Shanks, RN BSN CCM [facility]  [phone] business mobile phone Toll free office [phone]

## 2016-12-19 NOTE — Progress Notes (Signed)
Inpatient Diabetes Program Recommendations  AACE/ADA: New Consensus Statement on Inpatient Glycemic Control (2015)  Target Ranges:  Prepandial:   less than 140 mg/dL      Peak postprandial:   less than 180 mg/dL (1-2 hours)      Critically ill patients:  140 - 180 mg/dL   Results for Jacob [name], Jacob [name] (MRN [id number]) as of 12/19/2016 08:00  Ref. Range 12/18/2016 07:59 12/18/2016 12:01 12/18/2016 16:34 12/18/2016 21:10 12/19/2016 07:30  Glucose-Capillary Latest Ref Range: 65 - 99 mg/dL 480 (H) 165 (H) 537 (H) 176 (H) 371 (H)   Review of Glycemic Control  Diabetes history: DM2 Outpatient Diabetes medications: Glipizide 10 mg QAM, Lantus 25 units QHS Current orders for Inpatient glycemic control: Lantus 25 units QHS, Novolog 0-15 units TID with meals  Inpatient Diabetes Program Recommendations:  Insulin - Basal: Please consider increasing Lantus to 28 units QHS. Correction (SSI): Please consider ordering Novolog 0-5 units QHS for bedtime correction. Insulin-Meal Coverage: If Prednisone is continued, please consider ordering Novolog 3 units TID with meals for meal coverage if patient eats at least 50% of meals. HgbA1C: A1C 8.5% on 12/17/16 indicating an average glucose of 197 mg/dl over the past 2-3 months.   Thanks, Orlando Penner, RN, MSN, CDE Diabetes Coordinator Inpatient Diabetes Program [phone] (Team Pager from 8am to 5pm)

## 2016-12-20 ENCOUNTER — Telehealth: Payer: Self-pay | Admitting: Internal Medicine

## 2016-12-20 LAB — GLUCOSE, CAPILLARY
GLUCOSE-CAPILLARY: 194 mg/dL — AB (ref 65–99)
Glucose-Capillary: 336 mg/dL — ABNORMAL HIGH (ref 65–99)
Glucose-Capillary: 502 mg/dL (ref 65–99)
Glucose-Capillary: 303 mg/dL — ABNORMAL HIGH (ref 65–99)
Glucose-Capillary: 191 mg/dL — ABNORMAL HIGH (ref 65–99)

## 2016-12-20 LAB — BASIC METABOLIC PANEL
ANION GAP: 11 (ref 5–15)
ANION GAP: 13 (ref 5–15)
BUN: 27 mg/dL — AB (ref 6–20)
BUN: 35 mg/dL — ABNORMAL HIGH (ref 6–20)
CHLORIDE: 91 mmol/L — AB (ref 101–111)
CHLORIDE: 83 mmol/L — AB (ref 101–111)
CO2: 28 mmol/L (ref 22–32)
CO2: 29 mmol/L (ref 22–32)
Calcium: 8.4 mg/dL — ABNORMAL LOW (ref 8.9–10.3)
Calcium: 8.6 mg/dL — ABNORMAL LOW (ref 8.9–10.3)
Creatinine, Ser: 1.44 mg/dL — ABNORMAL HIGH (ref 0.61–1.24)
Creatinine, Ser: 1.59 mg/dL — ABNORMAL HIGH (ref 0.61–1.24)
GFR calc Af Amer: 60 mL/min — ABNORMAL LOW (ref >60)
GFR calc non Af Amer: 52 mL/min — ABNORMAL LOW (ref >60)
GFR calc non Af Amer: 46 mL/min — ABNORMAL LOW (ref >60)
GFR, EST AFRICAN AMERICAN: 53 mL/min — AB (ref >60)
GLUCOSE: 262 mg/dL — AB (ref 65–99)
Glucose, Bld: 443 mg/dL — ABNORMAL HIGH (ref 65–99)
POTASSIUM: 4.5 mmol/L (ref 3.5–5.1)
POTASSIUM: 5.1 mmol/L (ref 3.5–5.1)
Sodium: 130 mmol/L — ABNORMAL LOW (ref 135–145)
Sodium: 125 mmol/L — ABNORMAL LOW (ref 135–145)

## 2016-12-20 LAB — COOXEMETRY PANEL
CARBOXYHEMOGLOBIN: 2 % — AB (ref 0.5–1.5)
METHEMOGLOBIN: 1 % (ref 0.0–1.5)
O2 Saturation: 61.5 %
TOTAL HEMOGLOBIN: 12.9 g/dL (ref 12.0–16.0)

## 2016-12-20 LAB — CBC
HCT: 40 % (ref 39.0–52.0)
HEMOGLOBIN: 12.7 g/dL — AB (ref 13.0–17.0)
MCH: 27.1 pg (ref 26.0–34.0)
MCHC: 31.8 g/dL (ref 30.0–36.0)
MCV: 85.3 fL (ref 78.0–100.0)
PLATELETS: 223 10*3/uL (ref 150–400)
RBC: 4.69 MIL/uL (ref 4.22–5.81)
RDW: 17.5 % — ABNORMAL HIGH (ref 11.5–15.5)
WBC: 14.9 10*3/uL — ABNORMAL HIGH (ref 4.0–10.5)

## 2016-12-20 LAB — MAGNESIUM: MAGNESIUM: 1.8 mg/dL (ref 1.7–2.4)

## 2016-12-20 MED ORDER — METOLAZONE 2.5 MG PO TABS
2.5000 mg | ORAL_TABLET | Freq: Once | ORAL | Status: AC
  Administered 2016-12-20: 2.5 mg via ORAL
  Filled 2016-12-20: qty 1

## 2016-12-20 MED ORDER — INSULIN ASPART 100 UNIT/ML ~~LOC~~ SOLN
20.0000 [IU] | Freq: Once | SUBCUTANEOUS | Status: AC
  Administered 2016-12-20: 20 [IU] via SUBCUTANEOUS

## 2016-12-20 MED ORDER — MAGNESIUM SULFATE 2 GM/50ML IV SOLN
2.0000 g | Freq: Once | INTRAVENOUS | Status: AC
  Administered 2016-12-20: 2 g via INTRAVENOUS
  Filled 2016-12-20: qty 50

## 2016-12-20 NOTE — Telephone Encounter (Signed)
Met with patient this afternoon while he was at the hospital. Pt stated that he felt much better. Informed him of the [facility] clinic. He was aware. Scheduled him an appointment on 12/30/16 at 2pm. Patient stated that he will confirm with [place] if he needed transportation when we call him to remind him of his appointment.

## 2016-12-20 NOTE — Care Management Important Message (Signed)
Important Message  Patient Details  Name: Jacob [name] MRN: [id number] Date of Birth: 1955-11-18   Medicare Important Message Given:  Yes    [name] 12/20/2016, 1:20 PM

## 2016-12-20 NOTE — Progress Notes (Addendum)
Inpatient Diabetes Program Recommendations  AACE/ADA: New Consensus Statement on Inpatient Glycemic Control (2015)  Target Ranges:  Prepandial:   less than 140 mg/dL      Peak postprandial:   less than 180 mg/dL (1-2 hours)      Critically ill patients:  140 - 180 mg/dL   Lab Results  Component Value Date   GLUCAP 194 (H) 12/20/2016   HGBA1C 8.5 (H) 12/17/2016    Review of Glycemic ControlResults for MADAN, Jacob [name] (MRN [id number]) as of 12/20/2016 09:34  Ref. Range 12/19/2016 07:30 12/19/2016 10:50 12/19/2016 16:26 12/19/2016 21:12 12/20/2016 07:38  Glucose-Capillary Latest Ref Range: 65 - 99 mg/dL 884 (H) 166 (H) 063 (H) 222 (H) 194 (H)  Diabetes history:DM2 Outpatient Diabetes medications: Glipizide 10 mg QAM, Lantus 25 units QHS Current orders for Inpatient glycemic control: Lantus 28 units QHS, Novolog 0-15 units TID with meals  Inpatient Diabetes Program Recommendations:  While on steroids, please add Novolog meal coverage 3 units tid with meals. Also consider increasing Lantus to 32 units q HS.   Thanks, Beryl Meager, RN, BC-ADM Inpatient Diabetes Coordinator Pager [phone] (8a-5p)

## 2016-12-20 NOTE — Progress Notes (Signed)
Advanced Heart Failure Rounding Note  PCP:  Primary Cardiologist: Dr Leory Plowman   Subjective:    Admitted with marked volume overload.   12/18/16 switched to lasix drip 15 mg hour and continued on milrinone 0.25 mcg.  Feeling better. Denies SOB. Says baseline weight closer to 230.  Coox 61.5%. Creatinine stable at 1.44. K 4.5.  Negative 3.6 L and down another 6 lbs.  Objective:   Weight Range: 245 lb 6.4 oz (111.3 kg) Body mass index is 43.47 kg/m.   Vital Signs:   Temp:  [97.7 F (36.5 C)-98.3 F (36.8 C)] 98.3 F (36.8 C) (09/28 0800) Pulse Rate:  [97-107] 98 (09/28 0800) Resp:  [18] 18 (09/28 0800) BP: (113-130)/(61-89) 113/69 (09/28 0800) SpO2:  [94 %-98 %] 95 % (09/28 0800) Weight:  [245 lb 6.4 oz (111.3 kg)] 245 lb 6.4 oz (111.3 kg) (09/28 0557) Last BM Date: 12/17/16  Weight change: Filed Weights   12/18/16 0527 12/19/16 0507 12/20/16 0557  Weight: 252 lb 14.4 oz (114.7 kg) 251 lb 11.2 oz (114.2 kg) 245 lb 6.4 oz (111.3 kg)    Intake/Output:   Intake/Output Summary (Last 24 hours) at 12/20/16 0840 Last data filed at 12/20/16 0803  Gross per 24 hour  Intake           1723.2 ml  Output             5550 ml  Net          -3826.8 ml      Physical Exam    General: Well appearing. No resp difficulty. HEENT: Normal Neck: Supple. JVP 12 cm +. Carotids 2+ bilat; no bruits. No thyromegaly or nodule noted. Cor: PMI nondisplaced. Irregularly irregular. No M/G/R noted Lungs: CTAB, normal effort. Abdomen: Soft, non-tender, non-distended, no HSM. No bruits or masses. +BS  Extremities: No cyanosis, clubbing, or rash. 1+ edema 1/2 to knees. RUE PICC site stable.  Neuro: Alert & orientedx3, cranial nerves grossly intact. moves all 4 extremities w/o difficulty. Affect pleasant   Telemetry    Afib, personally reviewed.   EKG   N/A   Labs    CBC  Recent Labs  12/17/16 1430 12/20/16 0421  WBC 10.9* 14.9*  HGB 12.8* 12.7*  HCT 40.3 40.0  MCV 86.9  85.3  PLT 220 223   Basic Metabolic Panel  Recent Labs  12/17/16 1430  12/19/16 0637 12/20/16 0421  NA 132*  < > 131* 130*  K 3.1*  < > 4.4 4.5  CL 96*  < > 93* 91*  CO2 26  < > 28 28  GLUCOSE 214*  < > 286* 262*  BUN 19  < > 18 27*  CREATININE 1.39*  < > 1.19 1.44*  CALCIUM 8.2*  < > 8.2* 8.4*  MG 1.7  --   --  1.8  < > = values in this interval not displayed. Liver Function Tests  Recent Labs  12/17/16 1430  AST 21  ALT 19  ALKPHOS 137*  BILITOT 0.9  PROT 7.1  ALBUMIN 3.3*   No results for input(s): LIPASE, AMYLASE in the last 72 hours. Cardiac Enzymes No results for input(s): CKTOTAL, CKMB, CKMBINDEX, TROPONINI in the last 72 hours.  BNP: BNP (last 3 results)  Recent Labs  02/09/16 1210 02/19/16 1608 12/17/16 1430  BNP 323.1* 303.2* 394.2*    ProBNP (last 3 results) No results for input(s): PROBNP in the last 8760 hours.   D-Dimer No results for input(s): DDIMER in  the last 72 hours. Hemoglobin A1C  Recent Labs  12/17/16 1702  HGBA1C 8.5*   Fasting Lipid Panel No results for input(s): CHOL, HDL, LDLCALC, TRIG, CHOLHDL, LDLDIRECT in the last 72 hours. Thyroid Function Tests  Recent Labs  12/17/16 1430  TSH 2.269    Other results:   Imaging    Dg Knee 1-2 Views Right  Result Date: 12/19/2016 CLINICAL DATA:  Right knee pain for 1 month.  Fall. EXAM: RIGHT KNEE - 1-2 VIEW COMPARISON:  None. FINDINGS: Tricompartment degenerative changes, most pronounced in the patellofemoral compartment with joint space narrowing and spurring. Well corticated calcification superior to the patella on the lateral view likely related to old fracture or soft tissue injury. No acute fracture, subluxation or dislocation. No significant joint effusion. IMPRESSION: Mild-to-moderate degenerative changes as above. No acute bony abnormality. Electronically Signed   By: Charlett Nose M.D.   On: 12/19/2016 09:56     Medications:     Scheduled Medications: .  amiodarone  200 mg Oral Daily  . atorvastatin  40 mg Oral q1800  . digoxin  0.0625 mg Oral Daily  . ferrous sulfate  325 mg Oral Q breakfast  . gabapentin  300 mg Oral BID  . hydrALAZINE  12.5 mg Oral Q8H  . insulin aspart  0-15 Units Subcutaneous TID WC  . insulin glargine  28 Units Subcutaneous Q2200  . isosorbide mononitrate  30 mg Oral Daily  . loratadine  10 mg Oral Daily  . potassium chloride  40 mEq Oral BID  . rivaroxaban  20 mg Oral Q supper  . sodium chloride flush  3 mL Intravenous Q12H    Infusions: . sodium chloride 250 mL (12/17/16 2219)  . furosemide (LASIX) infusion 15 mg/hr (12/20/16 0228)  . 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  Admitted with marked volume overload. Assessment/Plan   1.A/C Systolic Heart Failure- ICM Repeat ECHO pending. Has ICD  - Coox 61% on milrinone 0.25 mcg. Renal function stable.  Continue lasix drip at 15 mg per hour. Add 2.5 mg metolazone today.  No bb with low output. Continue digoxin.  Continue 12.5 mg hydralazine every 8 hours +imdur 30 mg daily.  2.Chronic A fib - on amio for rate control. On xarelto.  - Hgb stable. 3. H/O GI bleed- had duodenal ulcer clip 2016. No bleeding. Hgb stable as above.  4. PAD-followed by Dr Allyson Sabal. Known SFA occlusion. Not a candidate for endovascular therapy. On statin and xarelto. 5. Smoker - counseled to stop. No change.  6. CAD- CABG x2 2014. On statin and xarelto. No s/s of ischemia.    7. Acute Gout: Uric Acid 9.6. Started prednisone and colchicine.  Improved. 8. R Knee Pain. Had a fall 1 month ago. No relief with steroids/colchicine. Xray R knee.  9.Uncontrolled Diabetes- Diabetes Coordinator recommendations appreciated. Increase night time lantus. No change.  He is not a candidate for home milrinone due to noncompliance.He remains difficult to manage in the community given ongoing noncompliance.Continue HF Paramedicine once discharged.   Likely needs at least 48  hrs further of diuresis.   Length of Stay: 3  Luane School  12/20/2016, 8:40 AM  Advanced Heart Failure Team Pager [phone] (M-F; 7a - 4p)  Please contact [facility] for night-coverage after hours (4p -7a ) and weekends on [email or web address]  Patient seen and examined with the above-signed Advanced Practice Provider and/or Housestaff. I personally reviewed laboratory data, imaging studies and relevant notes. I independently examined the patient and formulated the important aspects of the plan. I have edited the note to reflect any of my changes or salient points. I have personally discussed the plan with the patient and/or family.  Improving with IV lasix and milrinone. Still about 12 pounds up from baseline. Continue IV lasix and milrinone. Will add metolazone. R knee gout improved. Now able to walk. Wants to go home tomorrow but I told him likely will need to stay until Sunday.Continue Xarelto for AF.   Arvilla Meres, MD  9:17 AM

## 2016-12-20 NOTE — Progress Notes (Signed)
Tried to page PA (x2) regarding pt 502 blood sugar.

## 2016-12-21 LAB — GLUCOSE, CAPILLARY
GLUCOSE-CAPILLARY: 250 mg/dL — AB (ref 65–99)
GLUCOSE-CAPILLARY: 201 mg/dL — AB (ref 65–99)

## 2016-12-21 LAB — BASIC METABOLIC PANEL
Anion gap: 11 (ref 5–15)
BUN: 42 mg/dL — ABNORMAL HIGH (ref 6–20)
CO2: 34 mmol/L — AB (ref 22–32)
Calcium: 9.3 mg/dL (ref 8.9–10.3)
Chloride: 85 mmol/L — ABNORMAL LOW (ref 101–111)
Creatinine, Ser: 1.7 mg/dL — ABNORMAL HIGH (ref 0.61–1.24)
GFR calc non Af Amer: 42 mL/min — ABNORMAL LOW (ref >60)
GFR, EST AFRICAN AMERICAN: 49 mL/min — AB (ref >60)
GLUCOSE: 205 mg/dL — AB (ref 65–99)
POTASSIUM: 4.6 mmol/L (ref 3.5–5.1)
Sodium: 130 mmol/L — ABNORMAL LOW (ref 135–145)

## 2016-12-21 LAB — COOXEMETRY PANEL
CARBOXYHEMOGLOBIN: 2.1 % — AB (ref 0.5–1.5)
METHEMOGLOBIN: 0.5 % (ref 0.0–1.5)
O2 SAT: 56.6 %
Total hemoglobin: 15.2 g/dL (ref 12.0–16.0)

## 2016-12-21 LAB — MAGNESIUM: Magnesium: 2.4 mg/dL (ref 1.7–2.4)

## 2016-12-21 MED ORDER — ALLOPURINOL 100 MG PO TABS: 200.0000 mg | ORAL_TABLET | Freq: Every day | ORAL | 5 refills | Status: DC

## 2016-12-21 NOTE — Progress Notes (Signed)
Patient given discharge instructions and all questions answered.  

## 2016-12-21 NOTE — Progress Notes (Signed)
Advanced Heart Failure Rounding Note  PCP:  Primary Cardiologist: Dr Leory Plowman   Subjective:    Admitted with marked volume overload.   12/18/16 switched to lasix drip 15 mg hour and continued on milrinone 0.25 mcg.  Feels better. Demanding to go home. Weight down another 9 pounds. Denies dyspnea.   Weight at last discharge was 209 pounds but has been running 235-236 in clinic. He is 236 today.   Co-ox 57%. Creatinine 1.6-> 1.7  Objective:   Weight Range: 107.4 kg (236 lb 12.8 oz) Body mass index is 41.95 kg/m.   Vital Signs:   Temp:  [97.5 F (36.4 C)-97.8 F (36.6 C)] 97.5 F (36.4 C) (09/29 0526) Pulse Rate:  [95-102] 97 (09/29 0526) Resp:  [18-22] 20 (09/29 0526) BP: (107-133)/(46-76) 107/58 (09/29 0526) SpO2:  [94 %-100 %] 100 % (09/29 0526) Weight:  [107.4 kg (236 lb 12.8 oz)] 107.4 kg (236 lb 12.8 oz) (09/29 0500) Last BM Date: 12/19/16  Weight change: Filed Weights   12/19/16 0507 12/20/16 0557 12/21/16 0500  Weight: 114.2 kg (251 lb 11.2 oz) 111.3 kg (245 lb 6.4 oz) 107.4 kg (236 lb 12.8 oz)    Intake/Output:   Intake/Output Summary (Last 24 hours) at 12/21/16 0910 Last data filed at 12/21/16 0806  Gross per 24 hour  Intake              600 ml  Output             5750 ml  Net            -5150 ml      Physical Exam    General: Sitting in chair  No resp difficulty. HEENT: Normal Neck: Supple. JVP 7-8 +. Carotids 2+ bilat; no bruits. No thyromegaly or nodule noted. Cor: PMI nondisplaced. IRR 2/6 TR Lungs: CTAB, normal effort. Abdomen: Soft, NT/ND good BS, no HSM. No bruits or masses. +BS  Extremities: No cyanosis, clubbing, or rash .trace  edema 1/2 to knees. RUE PICC site stable.  Neuro: alert & oriented x 3, cranial nerves grossly intact. moves all 4 extremities w/o difficulty. Affect pleasant   Telemetry    Afib 90s, personally reviewed.   EKG   N/A   Labs    CBC  Recent Labs  12/20/16 0421  WBC 14.9*  HGB 12.7*  HCT 40.0   MCV 85.3  PLT 223   Basic Metabolic Panel  Recent Labs  12/20/16 0421 12/20/16 1812 12/21/16 0410  NA 130* 125* 130*  K 4.5 5.1 4.6  CL 91* 83* 85*  CO2 28 29 34*  GLUCOSE 262* 443* 205*  BUN 27* 35* 42*  CREATININE 1.44* 1.59* 1.70*  CALCIUM 8.4* 8.6* 9.3  MG 1.8  --  2.4   Liver Function Tests No results for input(s): AST, ALT, ALKPHOS, BILITOT, PROT, ALBUMIN in the last 72 hours. No results for input(s): LIPASE, AMYLASE in the last 72 hours. Cardiac Enzymes No results for input(s): CKTOTAL, CKMB, CKMBINDEX, TROPONINI in the last 72 hours.  BNP: BNP (last 3 results)  Recent Labs  02/09/16 1210 02/19/16 1608 12/17/16 1430  BNP 323.1* 303.2* 394.2*    ProBNP (last 3 results) No results for input(s): PROBNP in the last 8760 hours.   D-Dimer No results for input(s): DDIMER in the last 72 hours. Hemoglobin A1C No results for input(s): HGBA1C in the last 72 hours. Fasting Lipid Panel No results for input(s): CHOL, HDL, LDLCALC, TRIG, CHOLHDL, LDLDIRECT in the last 72  hours. Thyroid Function Tests No results for input(s): TSH, T4TOTAL, T3FREE, THYROIDAB in the last 72 hours.  Invalid input(s): FREET3  Other results:   Imaging    No results found.   Medications:     Scheduled Medications: . amiodarone  200 mg Oral Daily  . atorvastatin  40 mg Oral q1800  . digoxin  0.0625 mg Oral Daily  . ferrous sulfate  325 mg Oral Q breakfast  . gabapentin  300 mg Oral BID  . hydrALAZINE  12.5 mg Oral Q8H  . insulin aspart  0-15 Units Subcutaneous TID WC  . insulin glargine  28 Units Subcutaneous Q2200  . isosorbide mononitrate  30 mg Oral Daily  . loratadine  10 mg Oral Daily  . potassium chloride  40 mEq Oral BID  . rivaroxaban  20 mg Oral Q supper  . sodium chloride flush  3 mL Intravenous Q12H    Infusions: . sodium chloride 250 mL (12/17/16 2219)  . furosemide (LASIX) infusion 15 mg/hr (12/21/16 0805)  . milrinone 0.25 mcg/kg/min (12/21/16 0618)      PRN Medications: sodium chloride, acetaminophen, albuterol, ALPRAZolam, ondansetron (ZOFRAN) IV, oxyCODONE-acetaminophen, sodium chloride flush, sodium chloride flush    Patient Profile    Jacob [name] is a 61 year old [place] male with history of systolic HF EF 20-25% via Echo 10/2015 s/p INCEPTA ICD implant 6/14, RV dysfunction, CAD s/p CABG x 2 w/ RF MAZE 7/14 at [facility], DM type II, Chronic afib on xarelto, GI bleed 11/28/2014, and HLD.  Admitted with marked volume overload. Assessment/Plan   1.A/C Systolic Heart Failure- ICM Repeat ECHO pending. Has ICD  - Coox 57% on milrinone 0.25 mcg. Renal function stable.  - Weight back to near baseline on lasix gtt. He is adamant about going home today. Explained risk of deterioration with stopping milrinone abruptly. He understands but still wants to go home.  - Will stop milrinone and pull PICC  - D/c home on previous HF meds with close f/u in HF Clinic next week., 2.Chronic A fib - on amio for rate control. On xarelto.  - Hgb stable. 3. H/O GI bleed- had duodenal ulcer clip 2016. No bleeding. Hgb stable as above.  4. PAD-followed by Dr Allyson Sabal. Known SFA occlusion. Not a candidate for endovascular therapy. On statin and xarelto. 5. Smoker - counseled to stop. No change.  6. CAD- CABG x2 2014. On statin and xarelto. No s/s of ischemia.    7. R Knee Pain. ->Acute Gout: Uric Acid 9.6.  - Resolved with 3 days of prednisone and colchcine. R knee films normal.  - D/c home on allopurinol 200 daily.  8.Uncontrolled Diabetes- Diabetes Coordinator recommendations appreciated. Improved with med changes and stopping prednisone.  He is not a candidate for home milrinone due to noncompliance.He remains difficult to manage in the community given ongoing noncompliance.Continue HF Paramedicine once discharged.    Length of Stay: 4  Arvilla Meres, MD  12/21/2016, 9:10 AM  Advanced Heart Failure Team Pager [phone] (M-F; 7a - 4p)  Please contact  [facility] for night-coverage after hours (4p -7a ) and weekends on [email or web address]

## 2016-12-21 NOTE — Progress Notes (Signed)
PT Cancellation Note  Patient Details Name: Crecencio Cuppett MRN: [id number] DOB: 06-19-1955   Cancelled Treatment:    Reason Eval/Treat Not Completed: Other (comment) RN reporting pt is d/c and leaving the floor.   Gladys Damme, PT, DPT  Acute Rehabilitation Services  Pager: [phone]    Lehman Prom 12/21/2016, 12:02 PM

## 2016-12-21 NOTE — Discharge Summary (Signed)
Discharge Summary    Patient ID: Jacob [name],  MRN: [id number], DOB/AGE: [birth year] 61 y.o.  Admit date: 12/17/2016 Discharge date: 12/21/2016  Primary Care Provider: [name] Primary Cardiologist: Dr. Gala Romney  Discharge Diagnoses    Principal Problem:   Acute on chronic systolic (congestive) heart failure [facility]) Active Problems:   Essential hypertension   Noncompliance   CAD- s/p CABG July 2014 [facility]   ICD - in place- BS May 2014 [place]   Anemia of chronic disease   History of Present Illness     Jacob [name] is a 61 y.o. [place] male with history of chronic systolic HF (EF 20-25% via Echo 08/03/14 s/p INCEPTA ICD implant 6/14), RV dysfunction, CAD (s/p CABG x 2 w/ RF MAZE 7/14 at [facility]), Type 2 DM, chronic atrial fibrillation (on Xarelto), history of GI bleed in 11/2014, HLD, and medical noncompliance who was directly admitted to [facility] from the CHF Clinic on 12/17/2016 due to acute on chronic CHF.  He was visited by paramedicine that day and was SOB with little activity, weight was up about 10 pounds. Was noted to be significantly SOB with walking into clinic. According to paramedicine, he is taking his medications most of the time but continues to eat high salt foods (had a can of Pringles beside his bed). Drinking more than 2L a day. Says he has urinated once a day for the past 2 days despite taking 80 mg Torsemide BID and Metolazone.   He was directly admitted to the hospital and started on IV Lasix  BID for diuresis along with Milrinone.   Hospital Course     Consultants: None   The following morning, weight was down over 6 lbs. He was switched to a Lasix drip and continued on Milrinone. A repeat echo showed a slightly improved EF of 30-35% when compared to prior imaging.   On 12/21/2016, his weight was down to 236 lbs and Co-ox was at 57%. He remained on Milrinone and it was explained to the patient in detail that it needed to be weaned  down but he was adamant about being discharged and it was stopped abruptly. He was resumed on his PTA medication regimen.   He was continued on Amiodarone along with Xarelto for his atrial fibrillation. Had also been found to have an acute gout flare this admission (Uric Acid 9.6). He was treated with Prednisone and Colchicine during admission, with this being switched to Allopurinol  daily at the time of discharge.   He was last examined Dr. Gala Romney and deemed stable for discharge. A staff message has been sent to the Heart Failure clinic in regards to arranging a follow-up appointment.   _____________  Discharge Vitals Blood pressure (!) 107/58, pulse 97, temperature (!) 97.5 F (36.4 C), temperature source Oral, resp. rate 20, height  (1.6 m), weight 236 lb 12.8 oz (107.4 kg), SpO2 100 %.  Filed Weights   12/19/16 0507 12/20/16 0557 12/21/16 0500  Weight: 251 lb 11.2 oz (114.2 kg) 245 lb 6.4 oz (111.3 kg) 236 lb 12.8 oz (107.4 kg)    Labs & Radiologic Studies     CBC  Recent Labs  12/20/16 0421  WBC 14.9*  HGB 12.7*  HCT 40.0  MCV 85.3  PLT 223   Basic Metabolic Panel  Recent Labs  12/20/16 0421 12/20/16 1812 12/21/16 0410  NA 130* 125* 130*  K 4.5 5.1 4.6  CL 91* 83* 85*  CO2 28  29 34*  GLUCOSE 262* 443* 205*  BUN 27* 35* 42*  CREATININE 1.44* 1.59* 1.70*  CALCIUM 8.4* 8.6* 9.3  MG 1.8  --  2.4   Liver Function Tests No results for input(s): AST, ALT, ALKPHOS, BILITOT, PROT, ALBUMIN in the last 72 hours. No results for input(s): LIPASE, AMYLASE in the last 72 hours. Cardiac Enzymes No results for input(s): CKTOTAL, CKMB, CKMBINDEX, TROPONINI in the last 72 hours. BNP Invalid input(s): POCBNP D-Dimer No results for input(s): DDIMER in the last 72 hours. Hemoglobin A1C No results for input(s): HGBA1C in the last 72 hours. Fasting Lipid Panel No results for input(s): CHOL, HDL, LDLCALC, TRIG, CHOLHDL, LDLDIRECT in the last 72 hours. Thyroid  Function Tests No results for input(s): TSH, T4TOTAL, T3FREE, THYROIDAB in the last 72 hours.  Invalid input(s): FREET3  Dg Knee 1-2 Views Right  Result Date: 12/19/2016 CLINICAL DATA:  Right knee pain for 1 month.  Fall. EXAM: RIGHT KNEE - 1-2 VIEW COMPARISON:  None. FINDINGS: Tricompartment degenerative changes, most pronounced in the patellofemoral compartment with joint space narrowing and spurring. Well corticated calcification superior to the patella on the lateral view likely related to old fracture or soft tissue injury. No acute fracture, subluxation or dislocation. No significant joint effusion. IMPRESSION: Mild-to-moderate degenerative changes as above. No acute bony abnormality. Electronically Signed   By: Charlett Nose M.D.   On: 12/19/2016 09:56   Dg Chest Port 1 View  Result Date: 12/17/2016 CLINICAL DATA:  Confirm line placement EXAM: PORTABLE CHEST 1 VIEW COMPARISON:  01/11/2016 FINDINGS: Right PICC line tip is seen in the mid SVC. ICD device projects over the left axilla with lead in the right ventricle. There is stable cardiomegaly with median sternotomy sutures in place. Aortic atherosclerosis is noted without aneurysm. Blunting the right costophrenic angle laterally is consistent with a small to moderate pleural effusion. Interstitial edema is noted. No pneumonic consolidations. IMPRESSION: 1. Right-sided PICC line tip is noted in the mid SVC. 2. Cardiomegaly with aortic atherosclerosis and mild interstitial edema. Small to moderate right pleural effusion blunting the right lateral costophrenic angle. 3. ICD device in place with lead in the right ventricle. Electronically Signed   By: Tollie Eth M.D.   On: 12/17/2016 19:49     Diagnostic Studies/Procedures    Echocardiogram: 12/18/2016  Study Conclusions  - Left ventricle: Diffuse hypokinesis worse in the inferior wall.   The cavity size was moderately dilated. Wall thickness was   normal. Systolic function was moderately  to severely reduced. The   estimated ejection fraction was in the range of 30% to 35%. The   study is not technically sufficient to allow evaluation of LV   diastolic function. - Mitral valve: Calcified annulus. - Left atrium: The atrium was moderately dilated. - Atrial septum: No defect or patent foramen ovale was identified.   Disposition   Pt is being discharged home today in good condition.  Follow-up Plans & Appointments    Follow-up Information    Bensimhon, Bevelyn Buckles, MD Follow up.   Specialty:  Cardiology Why:  The office will contact you within 3 business days to arrange follow-up. If you do not hear from them within that time frame, please call the number provided.  Contact information: [address] [place] [address] [phone]        [facility] Follow up.   Why:  please call to schedule a follow up appt in a week. Contact  information: [address] [place] [address] [phone]           Discharge Medications     Medication List    TAKE these medications   ACCU-CHEK AVIVA device Use as instructed1 times daily before meals   accu-chek softclix lancets Use as instructed for once daily testing of blood sugar   ACCU-CHEK SOFTCLIX LANCETS lancets Use for once daily testing of blood sugar   acetaminophen-codeine 300-60 MG tablet Commonly known as:  TYLENOL #4 Take 1 tablet by mouth every 4 (four) hours as needed for pain.   albuterol 108 (90 Base) MCG/ACT inhaler Commonly known as:  PROVENTIL HFA;VENTOLIN HFA Inhale 2 puffs into the lungs every 6 (six) hours as needed for wheezing or shortness of breath.   albuterol (2.5 MG/3ML) 0.083% nebulizer solution Commonly known as:  PROVENTIL Take 3 mLs (2.5 mg total) by nebulization every 6 (six) hours as needed for wheezing or shortness of breath.   allopurinol 100 MG tablet Commonly known as:  ZYLOPRIM Take 2 tablets  (200 mg total) by mouth daily.   amiodarone 200 MG tablet Commonly known as:  PACERONE take 1 TABLET BY MOUTH EVERY DAY every morning   atorvastatin 40 MG tablet Commonly known as:  LIPITOR Take 1 tablet (40 mg total) by mouth daily at 6 PM. What changed:  when to take this   cetirizine 10 MG tablet Commonly known as:  ZYRTEC Take 1 tablet (10 mg total) by mouth daily.   digoxin 0.125 MG tablet Commonly known as:  LANOXIN Take 0.5 tablets (0.0625 mg total) by mouth daily.   ferrous sulfate 325 (65 FE) MG tablet TAKE 1 TABLET BY MOUTH 3 TIMES DAILY WITH MEALS.   gabapentin 300 MG capsule Commonly known as:  NEURONTIN Take 1 capsule (300 mg total) by mouth 2 (two) times daily.   glipiZIDE 10 MG tablet Commonly known as:  GLUCOTROL Take 10 mg by mouth daily before breakfast.   glucose blood test strip Commonly known as:  ACCU-CHEK AVIVA Use as instructed for 1 times daily testing of blood sugar   hydrALAZINE 25 MG tablet Commonly known as:  APRESOLINE take 1 TABLET BY MOUTH THREE TIMES DAILY every morning, noon,evening   Insulin Glargine 100 UNIT/ML Solostar Pen Commonly known as:  LANTUS Inject 25 Units into the skin daily at 10 pm.   Insulin Pen Needle 31G X 5 MM Misc Use as directed for once daily insulin injection   isosorbide mononitrate 30 MG 24 hr tablet Commonly known as:  IMDUR Take 1 tablet (30 mg total) by mouth daily.   meclizine 25 MG tablet Commonly known as:  ANTIVERT Take 25 mg by mouth 2 (two) times daily as needed for nausea.   metolazone 2.5 MG tablet Commonly known as:  ZAROXOLYN Take 1 tablet once as directed by CHF clinic.   spironolactone 25 MG tablet Commonly known as:  ALDACTONE TAKE 1 TABLET BY MOUTH DAILY.   torsemide 20 MG tablet Commonly known as:  DEMADEX Take 4 tablets (80 mg total) by mouth 2 (two) times daily.   traMADol 50 MG tablet Commonly known as:  ULTRAM Take 50 mg by mouth every 12 (twelve) hours as needed for  pain.   XARELTO 20 MG Tabs tablet Generic drug:  rivaroxaban take 1 TABLET BY MOUTH before supper in the evening What changed:  See the new instructions.         Allergies Allergies  Allergen Reactions  . Pork-Derived  Products Other (See Comments)    Religious preference     Outstanding Labs/Studies   BMET  Duration of Discharge Encounter   Greater than 30 minutes including physician time.  Signed, Ellsworth Lennox, PA-C 12/21/2016, 4:53 PM

## 2016-12-23 ENCOUNTER — Telehealth: Payer: Self-pay

## 2016-12-23 NOTE — Telephone Encounter (Signed)
[facility] Post-discharge Follow-Up Phone Call:  Date of Discharge:12/21/2016 Principal Discharge Diagnosis(es):  Acute on chronic systolic heart failure Post-discharge Communication: (Clearly document all attempts clearly and date contact made) call placed to the patient Call Completed: Yes                     With Whom:patient Interpreter Needed: no     Please check all that apply:  X  Patient is knowledgeable of his/her condition(s) and/or treatment. - when asked how he is feeling he said " not bad."  He was not home at the time of this CM call. He noted that he was with his son and a friend and explained briefly that his son is sick. He did not state what was wrong with his son and he was anxious to get off of the phone X Patient is caring for self at home.  ? Patient is receiving assist at home from family and/or caregiver. Family and/or caregiver is knowledgeable of patient's condition(s) and/or treatment. ? Patient is receiving home health services. If so, name of agency.     Medication Reconciliation:  ? Medication list reviewed with patient. X  Patient obtained all discharge medications. If not, why? - he has not picked up the allopurinol yet. He stated that he has all of his other medications and will wait for [facility] ( EMT ) to meet with him tomorrow to review the medications again notes that he was not at home. He said that he did not have any questions about the medications he had not checked his weight or Blood sugar today.    Activities of Daily Living:  X  Independent - complaining of pain with walking due to his gout flare. He said that he does not use a cane or other assistive device.  ? Needs assist (describe; ? home DME used) ? [facility] (describe, ? home DME used)   Community resources in place for patient:  X  None  - paramedicine through the Heart Failure clinic.  ? Home Health/Home DME ? Assisted Living ? Support Group         Questions/Concerns discussed: - He had no questions at this time.  He confirmed his appointment for 12/30/16 @ 1400 and said he would be at the appointment and had no transportation problems.

## 2016-12-25 ENCOUNTER — Telehealth: Payer: Self-pay | Admitting: *Deleted

## 2016-12-25 ENCOUNTER — Other Ambulatory Visit (HOSPITAL_COMMUNITY): Payer: Self-pay

## 2016-12-25 NOTE — Telephone Encounter (Addendum)
EMT Dee made a visit to patient home. Found pt with blood sugar of 486. He had 1/2 gallon of orange juice prior to paramedic visit. He c/o knee pain.  He had taken 25 u of insulin last night.  He has CHF. Last week was admitted to hospital for fluid retention and SOB. He is now being seen weekly for closer f/u.

## 2016-12-25 NOTE — Telephone Encounter (Signed)
Call placed to the patient after report from [place], EMT, noting the patient's elevated blood sugar. Marland Kitchen  He stated that he drank "a lot" of orange juice this morning and then noted that he will only drink water for the rest of the day. Reminded him of the importance of monitoring his intake of water due to his heart failure. He said that he understands that and will be careful. He also noted that he is going to avoid sugary and salty foods.   He statedd that he will check his blood sugar again tonight and in the morning and will take his lantus 25 units tonight.   He said this afternoon he will be going to see his son who is in the hospital. No other concerns reported

## 2016-12-25 NOTE — Progress Notes (Signed)
Paramedicine Encounter    Patient ID: Jacob [name], male    DOB: 1955/06/05, 61 y.o.   MRN: [id number]    Patient Care Team: [name], MD as PCP - General (Internal Medicine) Clarisa Schools, RN as Registered Nurse Jena Gauss, Gerrit Friends, MD as Consulting Physician (Gastroenterology)  Patient Active Problem List   Diagnosis Date Noted  . Acute on chronic systolic (congestive) heart failure (HCC) 12/17/2016  . Hyperglycemia 12/02/2016  . Acute pain of right knee 12/02/2016  . Congestive heart disease (HCC) 01/01/2016  . Diabetic neuropathy (HCC) 11/28/2015  . Ascites 11/09/2015  . CHF (congestive heart failure) (HCC) 11/09/2015  . Insomnia 04/18/2015  . Claudication of right lower extremity (HCC) 04/07/2015  . Cardiomyopathy, ischemic 04/07/2015  . Chronic anticoagulation 03/30/2015  . Cardiorenal syndrome with renal failure   . SOB (shortness of breath)   . Morbid obesity due to excess calories (HCC)   . Helicobacter pylori ab+ 12/12/2014  . Calf pain   . Duodenal ulcer with hemorrhage   . Hemorrhagic shock (HCC)   . Cardiogenic shock (HCC)   . Diabetes mellitus (HCC)   . Coronary artery disease involving native coronary artery of native heart without angina pectoris   . Atrial fibrillation with rapid ventricular response (HCC) 10/29/2014  . Chest pain   . Atherosclerosis of native arteries of extremity with intermittent claudication (HCC) 09/06/2014  . Atrial fibrillation with controlled ventricular response (HCC) 08/03/2014  . Bacteremia   . DM (diabetes mellitus), type 2 with peripheral vascular complications (HCC) 08/01/2014  . Anemia of chronic disease 08/01/2014  . PVD (peripheral vascular disease) (HCC) 10/29/2013  . Tobacco abuse 05/13/2013  . AF (atrial fibrillation) (HCC) 02/11/2013  . ICD - in place- BS May 2014 [facility] 02/11/2013  . Microcytic anemia 01/11/2013  . 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 Noncompliance 12/01/2011  . Essential  hypertension 10/30/2006    Current Outpatient Prescriptions:  .  ACCU-CHEK SOFTCLIX LANCETS lancets, Use for once daily testing of blood sugar, Disp: 100 each, Rfl: 12 .  acetaminophen-codeine (TYLENOL #4) 300-60 MG tablet, Take 1 tablet by mouth every 4 (four) hours as needed for pain., Disp: , Rfl: 0 .  albuterol (PROVENTIL HFA;VENTOLIN HFA) 108 (90 Base) MCG/ACT inhaler, Inhale 2 puffs into the lungs every 6 (six) hours as needed for wheezing or shortness of breath., Disp: 1 Inhaler, Rfl: 2 .  albuterol (PROVENTIL) (2.5 MG/3ML) 0.083% nebulizer solution, Take 3 mLs (2.5 mg total) by nebulization every 6 (six) hours as needed for wheezing or shortness of breath., Disp: 150 mL, Rfl: 1 .  allopurinol (ZYLOPRIM) 100 MG tablet, Take 2 tablets (200 mg total) by mouth daily., Disp: 60 tablet, Rfl: 5 .  amiodarone (PACERONE) 200 MG tablet, take 1 TABLET BY MOUTH EVERY DAY every morning, Disp: 30 tablet, Rfl: 3 .  atorvastatin (LIPITOR) 40 MG tablet, Take 1 tablet (40 mg total) by mouth daily at 6 PM. (Patient taking differently: Take 40 mg by mouth daily. ), Disp: 30 tablet, Rfl: 5 .  Blood Glucose Monitoring Suppl (ACCU-CHEK AVIVA) device, Use as instructed1 times daily before meals, Disp: 1 each, Rfl: 0 .  cetirizine (ZYRTEC) 10 MG tablet, Take 1 tablet (10 mg total) by mouth daily., Disp: 30 tablet, Rfl: 1 .  digoxin (LANOXIN) 0.125 MG tablet, Take 0.5 tablets (0.0625 mg total) by mouth daily., Disp: 14 tablet, Rfl: 2 .  ferrous sulfate 325 (65 FE) MG tablet, TAKE 1 TABLET BY  MOUTH 3 TIMES DAILY WITH MEALS., Disp: 90 tablet, Rfl: 2 .  gabapentin (NEURONTIN) 300 MG capsule, Take 1 capsule (300 mg total) by mouth 2 (two) times daily., Disp: 60 capsule, Rfl: 5 .  glipiZIDE (GLUCOTROL) 10 MG tablet, Take 10 mg by mouth daily before breakfast., Disp: , Rfl:  .  glucose blood (ACCU-CHEK AVIVA) test strip, Use as instructed for 1 times daily testing of blood sugar, Disp: 100 each, Rfl: 12 .  hydrALAZINE  (APRESOLINE) 25 MG tablet, take 1 TABLET BY MOUTH THREE TIMES DAILY every morning, noon,evening, Disp: 90 tablet, Rfl: 3 .  Insulin Glargine (LANTUS) 100 UNIT/ML Solostar Pen, Inject 25 Units into the skin daily at 10 pm., Disp: 4 pen, Rfl: 5 .  Insulin Pen Needle 31G X 5 MM MISC, Use as directed for once daily insulin injection, Disp: 100 each, Rfl: 2 .  isosorbide mononitrate (IMDUR) 30 MG 24 hr tablet, Take 1 tablet (30 mg total) by mouth daily., Disp: 30 tablet, Rfl: 3 .  Lancet Devices (ACCU-CHEK SOFTCLIX) lancets, Use as instructed for once daily testing of blood sugar, Disp: 1 each, Rfl: 0 .  meclizine (ANTIVERT) 25 MG tablet, Take 25 mg by mouth 2 (two) times daily as needed for nausea., Disp: , Rfl:  .  metolazone (ZAROXOLYN) 2.5 MG tablet, Take 1 tablet once as directed by CHF clinic., Disp: 3 tablet, Rfl: 0 .  spironolactone (ALDACTONE) 25 MG tablet, TAKE 1 TABLET BY MOUTH DAILY., Disp: 90 tablet, Rfl: 4 .  torsemide (DEMADEX) 20 MG tablet, Take 4 tablets (80 mg total) by mouth 2 (two) times daily., Disp: 240 tablet, Rfl: 3 .  traMADol (ULTRAM) 50 MG tablet, Take 50 mg by mouth every 12 (twelve) hours as needed for pain., Disp: , Rfl: 0 .  XARELTO 20 MG TABS tablet, take 1 TABLET BY MOUTH before supper in the evening (Patient taking differently: take one 20mg  tablet daily by mouth), Disp: 28 tablet, Rfl: 2 Allergies  Allergen Reactions  . Pork-Derived Products Other (See Comments)    Religious preference     Social History   Social History  . Marital status: Divorced    Spouse name: N/A  . Number of children: 3  . Years of education: N/A   Occupational History  . disabled    Social History Main Topics  . Smoking status: Current Every Day Smoker    Packs/day: 0.50    Years: 40.00    Types: Cigarettes  . Smokeless tobacco: Never Used  . Alcohol use No  . Drug use: No  . Sexual activity: No   Other Topics Concern  . Not on file   Social History Narrative   Has an  apartment with a roommate. He was living on the streets in 01-30-2013.  He reports that his father died in [place] in 01-30-2013.  He is divorced.  He is no longer estranged from his son, but still from his daughter who lives locally.  Neither of his parents, nor any siblings have any history of CAD.    Physical Exam  Pulmonary/Chest: No respiratory distress. He has no wheezes. He has no rales.  Abdominal: He exhibits no distension.  Musculoskeletal: He exhibits no edema.  Skin: Skin is warm and dry. He is not diaphoretic.        Future Appointments Date Time Provider Department Center  12/30/2016 2:00 PM Jaclyn Shaggy, MD [facility] None  01/07/2017 2:30 PM [facility] PA/NP [facility] None  02/11/2017 3:00 PM  Jaclyn Shaggy, MD [facility] None    ATF pt CAO x4 sitting on the side of the bed c/o right knee pain.  Pt was released from [facility] this past Sunday for fluid retention and sob.  Pt denies sob today.  Pts CBG is elevated after taking 25 mg insulin prior to my arrival.  rx packs verified/pt will be out next week therefore refills called in.    I Called community health and wellness and notified them of pt's cbg.  Message left on the triage nurse phone. Nurse called me back several mins later and I advised her of the same. She stated that she was going to call pt.      BP 110/70   Pulse 98   Resp 16   Wt 238 lb (108 kg)   SpO2 98%   BMI 42.16 kg/m  cbg 486  Weight yesterday-238 Last visit weight-254  (clinic admitted pt that day)    Vahan Wadsworth, EMT Paramedic 12/25/2016    ACTION: Home visit completed

## 2016-12-27 ENCOUNTER — Telehealth: Payer: Self-pay

## 2016-12-27 NOTE — Telephone Encounter (Signed)
Call placed to the patient and confirmed his appointment for 12/30/16 @ 1400. He said that he has transportation to the clinic. He reported that his blood sugar was 160 this morning. No complaints reported.

## 2016-12-30 ENCOUNTER — Inpatient Hospital Stay: Payer: Medicare Other | Admitting: Family Medicine

## 2016-12-30 ENCOUNTER — Other Ambulatory Visit (HOSPITAL_COMMUNITY): Payer: Self-pay | Admitting: Cardiology

## 2016-12-30 ENCOUNTER — Other Ambulatory Visit (HOSPITAL_COMMUNITY): Payer: Self-pay | Admitting: Student

## 2016-12-30 MED ORDER — HYDRALAZINE HCL 25 MG PO TABS: ORAL_TABLET | ORAL | 3 refills | Status: DC

## 2016-12-31 ENCOUNTER — Telehealth: Payer: Self-pay

## 2016-12-31 NOTE — Telephone Encounter (Signed)
Call received from the patient. Returning CM call. He said that he thought the office was closed yesterday for the holiday [facility] Day).  He apologized for missing the appointment and agreed to reschedule for 01/03/17 @ 1400.   He noted that his blood sugar this morning was 164 and his weight was 238 lbs. No problems reported and he said that [place], EMT is supposed to see him today.

## 2016-12-31 NOTE — Telephone Encounter (Signed)
Attempted to contact the patient to check on him and to reschedule his TCC appointment at [facility] as he was a no show yesterday for his appointment.  Call placed to # [phone] and a HIPAA compliant voicemail message was left requesting a call back to # [phone]

## 2017-01-02 ENCOUNTER — Other Ambulatory Visit (HOSPITAL_COMMUNITY): Payer: Self-pay

## 2017-01-02 NOTE — Progress Notes (Signed)
Made in error

## 2017-01-03 ENCOUNTER — Inpatient Hospital Stay: Payer: Medicare Other | Admitting: Family Medicine

## 2017-01-06 ENCOUNTER — Other Ambulatory Visit (HOSPITAL_COMMUNITY): Payer: Self-pay | Admitting: Cardiology

## 2017-01-06 MED ORDER — DIGOXIN 125 MCG PO TABS: 0.0625 mg | ORAL_TABLET | Freq: Every day | ORAL | 2 refills | Status: DC

## 2017-01-07 ENCOUNTER — Telehealth: Payer: Self-pay

## 2017-01-07 ENCOUNTER — Encounter (HOSPITAL_COMMUNITY): Payer: Medicare Other

## 2017-01-07 NOTE — Telephone Encounter (Signed)
Call placed to the patient to check on him as he was a no show for his last TCC appointment.  He stated that his car broke down and he was not able to get to the clinic. Reminded him that with the TCC program the clinic can provide cab transportation for him. An appointment was then scheduled for him for 01/17/17 @ 1400. He stated that he is doing okay, no complaints and is taking his medication " on time."  He noted that [place], EMT, will be seeing him again next week. He also noted that he will need to re-schedule his appointment with cardiology that was scheduled for today.

## 2017-01-13 ENCOUNTER — Telehealth: Payer: Self-pay | Admitting: *Deleted

## 2017-01-13 ENCOUNTER — Other Ambulatory Visit: Payer: Self-pay | Admitting: *Deleted

## 2017-01-13 DIAGNOSIS — I700 Atherosclerosis of aorta: Secondary | ICD-10-CM

## 2017-01-13 DIAGNOSIS — I739 Peripheral vascular disease, unspecified: Secondary | ICD-10-CM

## 2017-01-13 NOTE — Telephone Encounter (Signed)
Entry error

## 2017-01-14 ENCOUNTER — Other Ambulatory Visit (HOSPITAL_COMMUNITY): Payer: Self-pay

## 2017-01-14 NOTE — Progress Notes (Signed)
Paramedicine Encounter    Patient ID: Jacob [name], male    DOB: 21-May-1955, 61 y.o.   MRN: [id number]    Patient Care Team: Quentin AngstJegede, Olugbemiga E, MD as PCP - General (Internal Medicine) Clarisa SchoolsScott, Tim W, RN as Registered Nurse Jena Gaussourk, Gerrit Friendsobert M, MD as Consulting Physician (Gastroenterology)  Patient Active Problem List   Diagnosis Date Noted  . Acute on chronic systolic (congestive) heart failure (HCC) 12/17/2016  . Hyperglycemia 12/02/2016  . Acute pain of right knee 12/02/2016  . Congestive heart disease (HCC) 01/01/2016  . Diabetic neuropathy (HCC) 11/28/2015  . Ascites 11/09/2015  . CHF (congestive heart failure) (HCC) 11/09/2015  . Insomnia 04/18/2015  . Claudication of right lower extremity (HCC) 04/07/2015  . Cardiomyopathy, ischemic 04/07/2015  . Chronic anticoagulation 03/30/2015  . Cardiorenal syndrome with renal failure   . SOB (shortness of breath)   . Morbid obesity due to excess calories (HCC)   . Helicobacter pylori ab+ 12/12/2014  . Calf pain   . Duodenal ulcer with hemorrhage   . Hemorrhagic shock (HCC)   . Cardiogenic shock (HCC)   . Diabetes mellitus (HCC)   . Coronary artery disease involving native coronary artery of native heart without angina pectoris   . Atrial fibrillation with rapid ventricular response (HCC) 10/29/2014  . Chest pain   . Atherosclerosis of native arteries of extremity with intermittent claudication (HCC) 09/06/2014  . Atrial fibrillation with controlled ventricular response (HCC) 08/03/2014  . Bacteremia   . DM (diabetes mellitus), type 2 with peripheral vascular complications (HCC) 08/01/2014  . Anemia of chronic disease 08/01/2014  . PVD (peripheral vascular disease) (HCC) 10/29/2013  . Tobacco abuse 05/13/2013  . AF (atrial fibrillation) (HCC) 02/11/2013  . ICD - in place- BS May 2014 [facility] 02/11/2013  . Microcytic anemia 01/11/2013  . 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 Noncompliance 12/01/2011  . Essential  hypertension 10/30/2006    Current Outpatient Prescriptions:  .  ACCU-CHEK SOFTCLIX LANCETS lancets, Use for once daily testing of blood sugar, Disp: 100 each, Rfl: 12 .  acetaminophen-codeine (TYLENOL #4) 300-60 MG tablet, Take 1 tablet by mouth every 4 (four) hours as needed for pain., Disp: , Rfl: 0 .  albuterol (PROVENTIL HFA;VENTOLIN HFA) 108 (90 Base) MCG/ACT inhaler, Inhale 2 puffs into the lungs every 6 (six) hours as needed for wheezing or shortness of breath., Disp: 1 Inhaler, Rfl: 2 .  albuterol (PROVENTIL) (2.5 MG/3ML) 0.083% nebulizer solution, Take 3 mLs (2.5 mg total) by nebulization every 6 (six) hours as needed for wheezing or shortness of breath., Disp: 150 mL, Rfl: 1 .  allopurinol (ZYLOPRIM) 100 MG tablet, Take 2 tablets (200 mg total) by mouth daily., Disp: 60 tablet, Rfl: 5 .  amiodarone (PACERONE) 200 MG tablet, take 1 TABLET BY MOUTH EVERY DAY every morning, Disp: 28 tablet, Rfl: 3 .  atorvastatin (LIPITOR) 40 MG tablet, Take 1 tablet (40 mg total) by mouth daily at 6 PM. (Patient taking differently: Take 40 mg by mouth daily. ), Disp: 30 tablet, Rfl: 5 .  Blood Glucose Monitoring Suppl (ACCU-CHEK AVIVA) device, Use as instructed1 times daily before meals, Disp: 1 each, Rfl: 0 .  cetirizine (ZYRTEC) 10 MG tablet, Take 1 tablet (10 mg total) by mouth daily., Disp: 30 tablet, Rfl: 1 .  digoxin (LANOXIN) 0.125 MG tablet, Take 0.5 tablets (0.0625 mg total) by mouth daily., Disp: 14 tablet, Rfl: 2 .  ferrous sulfate 325 (65 FE) MG tablet, TAKE 1 TABLET BY  MOUTH 3 TIMES DAILY WITH MEALS., Disp: 90 tablet, Rfl: 2 .  gabapentin (NEURONTIN) 300 MG capsule, Take 1 capsule (300 mg total) by mouth 2 (two) times daily., Disp: 60 capsule, Rfl: 5 .  glipiZIDE (GLUCOTROL) 10 MG tablet, Take 10 mg by mouth daily before breakfast., Disp: , Rfl:  .  glucose blood (ACCU-CHEK AVIVA) test strip, Use as instructed for 1 times daily testing of blood sugar, Disp: 100 each, Rfl: 12 .  hydrALAZINE  (APRESOLINE) 25 MG tablet, take 1 TABLET BY MOUTH THREE TIMES DAILY every morning, noon,evening, Disp: 90 tablet, Rfl: 3 .  Insulin Glargine (LANTUS) 100 UNIT/ML Solostar Pen, Inject 25 Units into the skin daily at 10 pm., Disp: 4 pen, Rfl: 5 .  Insulin Pen Needle 31G X 5 MM MISC, Use as directed for once daily insulin injection, Disp: 100 each, Rfl: 2 .  isosorbide mononitrate (IMDUR) 30 MG 24 hr tablet, Take 1 tablet (30 mg total) by mouth daily., Disp: 30 tablet, Rfl: 3 .  Lancet Devices (ACCU-CHEK SOFTCLIX) lancets, Use as instructed for once daily testing of blood sugar, Disp: 1 each, Rfl: 0 .  meclizine (ANTIVERT) 25 MG tablet, Take 25 mg by mouth 2 (two) times daily as needed for nausea., Disp: , Rfl:  .  metolazone (ZAROXOLYN) 2.5 MG tablet, Take 1 tablet once as directed by CHF clinic., Disp: 3 tablet, Rfl: 0 .  spironolactone (ALDACTONE) 25 MG tablet, TAKE 1 TABLET BY MOUTH DAILY., Disp: 90 tablet, Rfl: 4 .  torsemide (DEMADEX) 20 MG tablet, Take 4 tablets (80 mg total) by mouth 2 (two) times daily., Disp: 240 tablet, Rfl: 3 .  traMADol (ULTRAM) 50 MG tablet, Take 50 mg by mouth every 12 (twelve) hours as needed for pain., Disp: , Rfl: 0 .  XARELTO 20 MG TABS tablet, take 1 TABLET BY MOUTH before supper in the evening (Patient taking differently: take one 20mg  tablet daily by mouth), Disp: 28 tablet, Rfl: 2 Allergies  Allergen Reactions  . Pork-Derived Products Other (See Comments)    Religious preference     Social History   Social History  . Marital status: Divorced    Spouse name: N/A  . Number of children: 3  . Years of education: N/A   Occupational History  . disabled    Social History Main Topics  . Smoking status: Current Every Day Smoker    Packs/day: 0.50    Years: 40.00    Types: Cigarettes  . Smokeless tobacco: Never Used  . Alcohol use No  . Drug use: No  . Sexual activity: No   Other Topics Concern  . Not on file   Social History Narrative   Has an  apartment with a roommate. He was living on the streets in October 2014.  He reports that his father died in [place] in October 2014.  He is divorced.  He is no longer estranged from his son, but still from his daughter who lives locally.  Neither of his parents, nor any siblings have any history of CAD.    Physical Exam  Pulmonary/Chest: No respiratory distress.  Abdominal: He exhibits no distension. There is no tenderness. There is no guarding.  Musculoskeletal: He exhibits no edema.  Skin: Skin is warm and dry. He is not diaphoretic.        Future Appointments Date Time Provider Department Center  01/17/2017 2:00 PM Jaclyn ShaggyAmao, [place], MD [facility] None  02/11/2017 3:00 PM Jaclyn ShaggyAmao, [place], MD [facility] None  ATF pt CAO x4 with no complaints sitting on the side of the bed.  Pts home care aide was cooking for him. He stated that his aide last day will be Friday due to him "not needing the service" per the agency. Pt had filled out a repeal for additional services.  Pt denies sob, chest pain and dizziness.  He stated that his feet still hurt sometimes but not today. Pt had taken his meds and hasn't missed his meds per pt.  His pill back was missing a couple of medications.  Therefore I took the unopened pill packs back to the pharmacy for them to put the other medications in the packs.  rx pill packs verified.   Missing: allopurinol, ferrous sulfate,   BP 118/70   Pulse 72   Resp 16   Wt 243 lb (110.2 kg)   SpO2 98%   BMI 43.05 kg/m   Weight yesterday-242 Last visit weight-238    Shawntel Farnworth, EMT Paramedic 01/15/2017    ACTION: Home visit completed

## 2017-01-15 NOTE — Progress Notes (Deleted)
Paramedicine Encounter    Patient ID: Jacob [name], male    DOB: 21-May-1955, 61 y.o.   MRN: [id number]    Patient Care Team: Quentin AngstJegede, Olugbemiga E, MD as PCP - General (Internal Medicine) Clarisa SchoolsScott, Tim W, RN as Registered Nurse Jena Gaussourk, Gerrit Friendsobert M, MD as Consulting Physician (Gastroenterology)  Patient Active Problem List   Diagnosis Date Noted  . Acute on chronic systolic (congestive) heart failure (HCC) 12/17/2016  . Hyperglycemia 12/02/2016  . Acute pain of right knee 12/02/2016  . Congestive heart disease (HCC) 01/01/2016  . Diabetic neuropathy (HCC) 11/28/2015  . Ascites 11/09/2015  . CHF (congestive heart failure) (HCC) 11/09/2015  . Insomnia 04/18/2015  . Claudication of right lower extremity (HCC) 04/07/2015  . Cardiomyopathy, ischemic 04/07/2015  . Chronic anticoagulation 03/30/2015  . Cardiorenal syndrome with renal failure   . SOB (shortness of breath)   . Morbid obesity due to excess calories (HCC)   . Helicobacter pylori ab+ 12/12/2014  . Calf pain   . Duodenal ulcer with hemorrhage   . Hemorrhagic shock (HCC)   . Cardiogenic shock (HCC)   . Diabetes mellitus (HCC)   . Coronary artery disease involving native coronary artery of native heart without angina pectoris   . Atrial fibrillation with rapid ventricular response (HCC) 10/29/2014  . Chest pain   . Atherosclerosis of native arteries of extremity with intermittent claudication (HCC) 09/06/2014  . Atrial fibrillation with controlled ventricular response (HCC) 08/03/2014  . Bacteremia   . DM (diabetes mellitus), type 2 with peripheral vascular complications (HCC) 08/01/2014  . Anemia of chronic disease 08/01/2014  . PVD (peripheral vascular disease) (HCC) 10/29/2013  . Tobacco abuse 05/13/2013  . AF (atrial fibrillation) (HCC) 02/11/2013  . ICD - in place- BS [facility]/11[facility]  . Microcytic anemia 01/11/2013  . 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 Noncompliance 12/01/2011  . Essential  hypertension 10/30/2006    Current Outpatient Prescriptions:  .  ACCU-CHEK SOFTCLIX LANCETS lancets, Use for once daily testing of blood sugar, Disp: 100 each, Rfl: 12 .  acetaminophen-codeine (TYLENOL #4) 300-60 MG tablet, Take 1 tablet by mouth every 4 (four) hours as needed for pain., Disp: , Rfl: 0 .  albuterol (PROVENTIL HFA;VENTOLIN HFA) 108 (90 Base) MCG/ACT inhaler, Inhale 2 puffs into the lungs every 6 (six) hours as needed for wheezing or shortness of breath., Disp: 1 Inhaler, Rfl: 2 .  albuterol (PROVENTIL) (2.5 MG/3ML) 0.083% nebulizer solution, Take 3 mLs (2.5 mg total) by nebulization every 6 (six) hours as needed for wheezing or shortness of breath., Disp: 150 mL, Rfl: 1 .  allopurinol (ZYLOPRIM) 100 MG tablet, Take 2 tablets (200 mg total) by mouth daily., Disp: 60 tablet, Rfl: 5 .  amiodarone (PACERONE) 200 MG tablet, take 1 TABLET BY MOUTH EVERY DAY every morning, Disp: 28 tablet, Rfl: 3 .  atorvastatin (LIPITOR) 40 MG tablet, Take 1 tablet (40 mg total) by mouth daily at 6 PM. (Patient taking differently: Take 40 mg by mouth daily. ), Disp: 30 tablet, Rfl: 5 .  Blood Glucose Monitoring Suppl (ACCU-CHEK AVIVA) device, Use as instructed1 times daily before meals, Disp: 1 each, Rfl: 0 .  cetirizine (ZYRTEC) 10 MG tablet, Take 1 tablet (10 mg total) by mouth daily., Disp: 30 tablet, Rfl: 1 .  digoxin (LANOXIN) 0.125 MG tablet, Take 0.5 tablets (0.0625 mg total) by mouth daily., Disp: 14 tablet, Rfl: 2 .  ferrous sulfate 325 (65 FE) MG tablet, TAKE 1 TABLET BY  MOUTH 3 TIMES DAILY WITH MEALS., Disp: 90 tablet, Rfl: 2 .  gabapentin (NEURONTIN) 300 MG capsule, Take 1 capsule (300 mg total) by mouth 2 (two) times daily., Disp: 60 capsule, Rfl: 5 .  glipiZIDE (GLUCOTROL) 10 MG tablet, Take 10 mg by mouth daily before breakfast., Disp: , Rfl:  .  glucose blood (ACCU-CHEK AVIVA) test strip, Use as instructed for 1 times daily testing of blood sugar, Disp: 100 each, Rfl: 12 .  hydrALAZINE  (APRESOLINE) 25 MG tablet, take 1 TABLET BY MOUTH THREE TIMES DAILY every morning, noon,evening, Disp: 90 tablet, Rfl: 3 .  Insulin Glargine (LANTUS) 100 UNIT/ML Solostar Pen, Inject 25 Units into the skin daily at 10 pm., Disp: 4 pen, Rfl: 5 .  Insulin Pen Needle 31G X 5 MM MISC, Use as directed for once daily insulin injection, Disp: 100 each, Rfl: 2 .  isosorbide mononitrate (IMDUR) 30 MG 24 hr tablet, Take 1 tablet (30 mg total) by mouth daily., Disp: 30 tablet, Rfl: 3 .  Lancet Devices (ACCU-CHEK SOFTCLIX) lancets, Use as instructed for once daily testing of blood sugar, Disp: 1 each, Rfl: 0 .  meclizine (ANTIVERT) 25 MG tablet, Take 25 mg by mouth 2 (two) times daily as needed for nausea., Disp: , Rfl:  .  metolazone (ZAROXOLYN) 2.5 MG tablet, Take 1 tablet once as directed by CHF clinic., Disp: 3 tablet, Rfl: 0 .  spironolactone (ALDACTONE) 25 MG tablet, TAKE 1 TABLET BY MOUTH DAILY., Disp: 90 tablet, Rfl: 4 .  torsemide (DEMADEX) 20 MG tablet, Take 4 tablets (80 mg total) by mouth 2 (two) times daily., Disp: 240 tablet, Rfl: 3 .  traMADol (ULTRAM) 50 MG tablet, Take 50 mg by mouth every 12 (twelve) hours as needed for pain., Disp: , Rfl: 0 .  XARELTO 20 MG TABS tablet, take 1 TABLET BY MOUTH before supper in the evening (Patient taking differently: take one 20mg  tablet daily by mouth), Disp: 28 tablet, Rfl: 2 Allergies  Allergen Reactions  . Pork-Derived Products Other (See Comments)    Religious preference     Social History   Social History  . Marital status: Divorced    Spouse name: N/A  . Number of children: 3  . Years of education: N/A   Occupational History  . disabled    Social History Main Topics  . Smoking status: Current Every Day Smoker    Packs/day: 0.50    Years: 40.00    Types: Cigarettes  . Smokeless tobacco: Never Used  . Alcohol use No  . Drug use: No  . Sexual activity: No   Other Topics Concern  . Not on file   Social History Narrative   Has an  apartment with a roommate. He was living on the streets in Feb 08, 2013.  He reports that his father died in [place] in 02-08-2013.  He is divorced.  He is no longer estranged from his son, but still from his daughter who lives locally.  Neither of his parents, nor any siblings have any history of CAD.    Physical Exam      Future Appointments Date Time Provider Department Center  01/17/2017 2:00 PM Jaclyn Shaggy, MD [facility] None  02/11/2017 3:00 PM Jaclyn Shaggy, MD [facility] None    ATF pt CAO x4 sitting on the side of the bed with no complaints.    Missing: allopurinol, ferrous sulfate,   BP 118/70   Pulse 72   Resp 16   Wt 243 lb (  110.2 kg)   SpO2 98%   BMI 43.05 kg/m  cbg 223     Chabeli Barsamian, EMT Paramedic 01/15/2017    ACTION: Home visit completed

## 2017-01-17 ENCOUNTER — Ambulatory Visit: Payer: Medicare Other | Attending: Family Medicine | Admitting: Family Medicine

## 2017-01-17 ENCOUNTER — Encounter: Payer: Self-pay | Admitting: Pharmacist

## 2017-01-17 ENCOUNTER — Encounter: Payer: Self-pay | Admitting: Family Medicine

## 2017-01-17 ENCOUNTER — Telehealth: Payer: Self-pay | Admitting: Internal Medicine

## 2017-01-17 VITALS — BP 131/86 | HR 100 | Temp 97.7°F | Ht 65.0 in | Wt 243.2 lb

## 2017-01-17 DIAGNOSIS — M1711 Unilateral primary osteoarthritis, right knee: Secondary | ICD-10-CM | POA: Insufficient documentation

## 2017-01-17 DIAGNOSIS — Z79899 Other long term (current) drug therapy: Secondary | ICD-10-CM | POA: Diagnosis not present

## 2017-01-17 DIAGNOSIS — I252 Old myocardial infarction: Secondary | ICD-10-CM | POA: Diagnosis not present

## 2017-01-17 DIAGNOSIS — M109 Gout, unspecified: Secondary | ICD-10-CM | POA: Diagnosis not present

## 2017-01-17 DIAGNOSIS — E1149 Type 2 diabetes mellitus with other diabetic neurological complication: Secondary | ICD-10-CM

## 2017-01-17 DIAGNOSIS — E7800 Pure hypercholesterolemia, unspecified: Secondary | ICD-10-CM | POA: Diagnosis not present

## 2017-01-17 DIAGNOSIS — E1151 Type 2 diabetes mellitus with diabetic peripheral angiopathy without gangrene: Secondary | ICD-10-CM | POA: Diagnosis not present

## 2017-01-17 DIAGNOSIS — I482 Chronic atrial fibrillation: Secondary | ICD-10-CM | POA: Diagnosis not present

## 2017-01-17 DIAGNOSIS — Z95810 Presence of automatic (implantable) cardiac defibrillator: Secondary | ICD-10-CM | POA: Insufficient documentation

## 2017-01-17 DIAGNOSIS — Z9114 Patient's other noncompliance with medication regimen: Secondary | ICD-10-CM | POA: Diagnosis not present

## 2017-01-17 DIAGNOSIS — Z7901 Long term (current) use of anticoagulants: Secondary | ICD-10-CM | POA: Insufficient documentation

## 2017-01-17 DIAGNOSIS — Z794 Long term (current) use of insulin: Secondary | ICD-10-CM | POA: Insufficient documentation

## 2017-01-17 DIAGNOSIS — I4891 Unspecified atrial fibrillation: Secondary | ICD-10-CM | POA: Diagnosis not present

## 2017-01-17 DIAGNOSIS — Z951 Presence of aortocoronary bypass graft: Secondary | ICD-10-CM | POA: Diagnosis not present

## 2017-01-17 DIAGNOSIS — I110 Hypertensive heart disease with heart failure: Secondary | ICD-10-CM | POA: Insufficient documentation

## 2017-01-17 DIAGNOSIS — I10 Essential (primary) hypertension: Secondary | ICD-10-CM | POA: Diagnosis not present

## 2017-01-17 DIAGNOSIS — R635 Abnormal weight gain: Secondary | ICD-10-CM | POA: Insufficient documentation

## 2017-01-17 DIAGNOSIS — I2510 Atherosclerotic heart disease of native coronary artery without angina pectoris: Secondary | ICD-10-CM | POA: Diagnosis not present

## 2017-01-17 DIAGNOSIS — I5042 Chronic combined systolic (congestive) and diastolic (congestive) heart failure: Secondary | ICD-10-CM | POA: Insufficient documentation

## 2017-01-17 DIAGNOSIS — I779 Disorder of arteries and arterioles, unspecified: Secondary | ICD-10-CM | POA: Diagnosis not present

## 2017-01-17 LAB — GLUCOSE, POCT (MANUAL RESULT ENTRY): POC GLUCOSE: 126 mg/dL — AB (ref 70–99)

## 2017-01-17 MED ORDER — INSULIN GLARGINE 100 UNIT/ML SOLOSTAR PEN: 25.0000 [IU] | PEN_INJECTOR | Freq: Every day | SUBCUTANEOUS | 5 refills | Status: DC

## 2017-01-17 MED ORDER — DICLOFENAC SODIUM 1 % TD GEL: 4.0000 g | Freq: Four times a day (QID) | TRANSDERMAL | 1 refills | Status: DC

## 2017-01-17 NOTE — Telephone Encounter (Signed)
The pharmacy called and asked if the patient needs to be on allopurinol and iron pills

## 2017-01-17 NOTE — Progress Notes (Signed)
Prior authorization completed and submitted to [facility] for Voltaren Gel. Approval pending.

## 2017-01-17 NOTE — Progress Notes (Signed)
Subjective:  Patient ID: Jacob [name], male    DOB: [birth year]  Age: 61 y.o. 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  He had presented with weight gain of about 10 pounds after a para medicine visit and endorsed noncompliance with low-sodium diet and medications. Hospital course was also complicated by a gout flare which was treated with prednisone and colchicine. He received diuresis with Lasix and was also placed on milrinone and subsequently discharged to follow-up at the heart failure clinic.  2d echo 12/18/16: Study Conclusions  - Left ventricle: Diffuse hypokinesis worse in the inferior wall.   The cavity size was moderately dilated. Wall thickness was   normal. Systolic function was moderately to severely reduced. The   estimated ejection fraction was in the range of 30% to 35%. The   study is not technically sufficient to allow evaluation of LV   diastolic function. - Mitral valve: Calcified annulus. - Left atrium: The atrium was moderately dilated. - Atrial septum: No defect or patent foramen ovale was identified.  Today he complains about right knee pain which is worse on the medial aspect of his knee; tinnitus trauma. Pain is described as 10/10 and not relieved with tramadol. He denies swelling. Pain is worse with going up and down the stairs and walking. He is requesting a refill of Lantus.  Past Medical History:  Diagnosis Date  . Atrial fibrillation (HCC)    RVR 10/2014  . Automatic implantable cardioverter-defibrillator in situ   . CAD  (coronary artery disease) Sept 2013   s/p cardiac cath showing occlusion of small RCA with collaterals  . CHF (congestive heart failure) (HCC)    20 to 25 % EF and RV dysfunction by 07/2014 echo   . Chronic anticoagulation    on xarelto.   . High cholesterol   . Hypertension   . Myocardial infarction (HCC) 2014  . Noncompliance    homelessness contributing.   . Peripheral arterial disease (HCC)   . Type II diabetes mellitus (HCC)     Past Surgical History:  Procedure Laterality Date  . CARDIAC CATHETERIZATION  09/2012  . CORONARY ANGIOPLASTY WITH STENT PLACEMENT  11/2011   "1"  . CORONARY ARTERY BYPASS GRAFT  09/2012   2 vessels per patient [name]   . ESOPHAGOGASTRODUODENOSCOPY N/A 11/28/2014   Procedure: ESOPHAGOGASTRODUODENOSCOPY (EGD);  Surgeon: Beverley Fiedler, MD;  [facility] ENDOSCOPY;  Service: Endoscopy;  Laterality: N/A;  . ILIAC ARTERY STENT Right 08/30/2013  . IMPLANTABLE CARDIOVERTER DEFIBRILLATOR IMPLANT     [place] in 07/2012; [employer]  . LEFT AND RIGHT HEART CATHETERIZATION WITH CORONARY ANGIOGRAM N/A 12/02/2011   Procedure: LEFT AND RIGHT HEART CATHETERIZATION WITH CORONARY ANGIOGRAM;  Surgeon: Kathleene Hazel, MD;  Location: [facility] CATH LAB;  Service: Cardiovascular;  Laterality: N/A;  . LOWER EXTREMITY ANGIOGRAM N/A 08/30/2013   Procedure: LOWER EXTREMITY ANGIOGRAM;  Surgeon: Runell Gess, MD;  Location: [facility] CATH LAB;  Service: Cardiovascular;  Laterality: N/A;  . LOWER EXTREMITY ANGIOGRAM N/A 12/02/2013   Procedure: LOWER EXTREMITY ANGIOGRAM;  Surgeon: Runell Gess, MD;  Location: [facility] CATH  LAB;  Service: Cardiovascular;  Laterality: N/A;    Allergies  Allergen Reactions  . Pork-Derived Products Other (See Comments)    Religious preference     Outpatient Medications Prior to Visit  Medication Sig Dispense Refill  . ACCU-CHEK SOFTCLIX LANCETS lancets Use for once daily testing of blood sugar 100 each 12  . albuterol (PROVENTIL HFA;VENTOLIN HFA) 108  (90 Base) MCG/ACT inhaler Inhale 2 puffs into the lungs every 6 (six) hours as needed for wheezing or shortness of breath. 1 Inhaler 2  . albuterol (PROVENTIL) (2.5 MG/3ML) 0.083% nebulizer solution Take 3 mLs (2.5 mg total) by nebulization every 6 (six) hours as needed for wheezing or shortness of breath. 150 mL 1  . allopurinol (ZYLOPRIM) 100 MG tablet Take 2 tablets (200 mg total) by mouth daily. 60 tablet 5  . amiodarone (PACERONE) 200 MG tablet take 1 TABLET BY MOUTH EVERY DAY every morning 28 tablet 3  . atorvastatin (LIPITOR) 40 MG tablet Take 1 tablet (40 mg total) by mouth daily at 6 PM. (Patient taking differently: Take 40 mg by mouth daily. ) 30 tablet 5  . Blood Glucose Monitoring Suppl (ACCU-CHEK AVIVA) device Use as instructed1 times daily before meals 1 each 0  . cetirizine (ZYRTEC) 10 MG tablet Take 1 tablet (10 mg total) by mouth daily. 30 tablet 1  . digoxin (LANOXIN) 0.125 MG tablet Take 0.5 tablets (0.0625 mg total) by mouth daily. 14 tablet 2  . ferrous sulfate 325 (65 FE) MG tablet TAKE 1 TABLET BY MOUTH 3 TIMES DAILY WITH MEALS. 90 tablet 2  . gabapentin (NEURONTIN) 300 MG capsule Take 1 capsule (300 mg total) by mouth 2 (two) times daily. 60 capsule 5  . glipiZIDE (GLUCOTROL) 10 MG tablet Take 10 mg by mouth daily before breakfast.    . glucose blood (ACCU-CHEK AVIVA) test strip Use as instructed for 1 times daily testing of blood sugar 100 each 12  . hydrALAZINE (APRESOLINE) 25 MG tablet take 1 TABLET BY MOUTH THREE TIMES DAILY every morning, noon,evening 90 tablet 3  . Insulin Pen Needle 31G X 5 MM MISC Use as directed for once daily insulin injection 100 each 2  . isosorbide mononitrate (IMDUR) 30 MG 24 hr tablet Take 1 tablet (30 mg total) by mouth daily. 30 tablet 3  . Lancet Devices (ACCU-CHEK SOFTCLIX) lancets Use as instructed for once daily testing of blood sugar 1 each 0  . meclizine (ANTIVERT) 25 MG tablet Take 25 mg by mouth 2 (two) times daily as needed for  nausea.    . metolazone (ZAROXOLYN) 2.5 MG tablet Take 1 tablet once as directed by CHF clinic. 3 tablet 0  . spironolactone (ALDACTONE) 25 MG tablet TAKE 1 TABLET BY MOUTH DAILY. 90 tablet 4  . torsemide (DEMADEX) 20 MG tablet Take 4 tablets (80 mg total) by mouth 2 (two) times daily. 240 tablet 3  . XARELTO 20 MG TABS tablet take 1 TABLET BY MOUTH before supper in the evening (Patient taking differently: take one 20mg  tablet daily by mouth) 28 tablet 2  . Insulin Glargine (LANTUS) 100 UNIT/ML Solostar Pen Inject 25 Units into the skin daily at 10 pm. 4 pen 5  . acetaminophen-codeine (TYLENOL #4) 300-60 MG tablet Take 1 tablet by mouth every 4 (four) hours as needed for pain.  0  . traMADol (ULTRAM) 50 MG tablet Take 50 mg by mouth every 12 (twelve) hours as needed for pain.  0   No facility-administered medications prior to  visit.     ROS Review of Systems  Constitutional: Negative for activity change and appetite change.  HENT: Negative for sinus pressure and sore throat.   Eyes: Negative for visual disturbance.  Respiratory: Negative for cough, chest tightness and shortness of breath.   Cardiovascular: Negative for chest pain and leg swelling.  Gastrointestinal: Negative for abdominal distention, abdominal pain, constipation and diarrhea.  Endocrine: Negative.   Genitourinary: Negative for dysuria.  Musculoskeletal:       See hpi  Skin: Negative for rash.  Allergic/Immunologic: Negative.   Neurological: Negative for weakness, light-headedness and numbness.  Psychiatric/Behavioral: Negative for dysphoric mood and suicidal ideas.    Objective:  BP 131/86   Pulse 100   Temp 97.7 F (36.5 C) (Oral)   Ht 5\' 5"  (1.651 m)   Wt 243 lb 3.2 oz (110.3 kg)   SpO2 98%   BMI 40.47 kg/m   BP/Weight 01/17/2017 01/14/2017 12/25/2016  Systolic BP 131 118 110  Diastolic BP 86 70 70  Wt. (Lbs) 243.2 243 238  BMI 40.47 43.05 42.16      Physical Exam  Constitutional: He is oriented to  person, place, and time. He appears well-developed and well-nourished.  Cardiovascular: Normal rate, normal heart sounds and intact distal pulses.  An irregularly irregular rhythm present.  No murmur heard. Pulmonary/Chest: Effort normal and breath sounds normal. He has no wheezes. He has no rales. He exhibits no tenderness.  Abdominal: Soft. Bowel sounds are normal. He exhibits no distension and no mass. There is no tenderness.  Musculoskeletal: He exhibits tenderness (tenderness on medial aspect of right knee; crepitus on range of motion). He exhibits no edema.  Neurological: He is alert and oriented to person, place, and time.  Skin: Skin is warm and dry.  Psychiatric: He has a normal mood and affect.     Lab Results  Component Value Date   HGBA1C 8.5 (H) 12/17/2016    Assessment & Plan:   1. Other diabetic neurological complication associated with type 2 diabetes mellitus (HCC) Noted goal with A1c of 8.5. Suboptimal control likely due to noncompliance and he has been out of Lantus No regimen changed today Diabetic diet - POCT glucose (manual entry) - Insulin Glargine (LANTUS) 100 UNIT/ML Solostar Pen; Inject 25 Units into the skin daily at 10 pm.  Dispense: 4 pen; Refill: 5  2. Essential hypertension Controlled Continue antihypertensives Low-sodium, DASH diet  3. Chronic combined systolic and diastolic congestive heart failure (HCC) EF 30-35% from 2-D echo of 11/2016 Acute exacerbation has resolved Continue torsemide, isosorbide, hydralazine, spironolactone, digoxin ACE inhibitor discontinued in the past due to soft blood pressure  4. Atrial fibrillation with rapid ventricular response (HCC) Continue anticoagulation with Xarelto on rate control with  5. Primary osteoarthritis of right knee Uncontrolled on tramadol Unable to place him on oral NSAIDs due to anticoagulants - we'll place on Voltaren gel Ambulatory referral to Orthopedic Surgery   Meds ordered this  encounter  Medications  . Insulin Glargine (LANTUS) 100 UNIT/ML Solostar Pen    Sig: Inject 25 Units into the skin daily at 10 pm.    Dispense:  4 pen    Refill:  5    Discontinue previous dose  . diclofenac sodium (VOLTAREN) 1 % GEL    Sig: Apply 4 g topically 4 (four) times daily.    Dispense:  100 g    Refill:  1    Follow-up: Return in about 1 month (around 02/17/2017) for TCC Follow-up of  CHF.   Jaclyn Shaggy MD

## 2017-01-17 NOTE — Patient Instructions (Signed)

## 2017-01-17 NOTE — Telephone Encounter (Signed)
He should be on allopurinol. I have discontinued ferrous sulfate as he no longer needs it.

## 2017-01-21 ENCOUNTER — Other Ambulatory Visit (HOSPITAL_COMMUNITY): Payer: Self-pay | Admitting: Pharmacist

## 2017-01-21 MED ORDER — ALLOPURINOL 100 MG PO TABS: 200.0000 mg | ORAL_TABLET | Freq: Every day | ORAL | 5 refills | Status: DC

## 2017-01-22 ENCOUNTER — Telehealth: Payer: Self-pay | Admitting: Internal Medicine

## 2017-01-22 NOTE — Telephone Encounter (Signed)
Pt called requesting to speak to case manager regarding [facility] being cancelled for patient, pt would like to know why he was cancelled. Please f/up

## 2017-01-23 ENCOUNTER — Telehealth: Payer: Self-pay | Admitting: Internal Medicine

## 2017-01-23 NOTE — Telephone Encounter (Signed)
Call placed to [facility] [phone] to inquire why patient's PCS services was cancelled. Spoke with Boneta Lucks and she informed that patient had an assessment performed on 01/07/2017 and it was concluded that he no longer needs services. Services denied. When I asked Boneta Lucks why, she didn't give details and said "it was denied". Boneta Lucks also stated that patient  has 30 days (from assessment date) to appeal or can request PCP to send a new referral to them.

## 2017-01-23 NOTE — Telephone Encounter (Signed)
Call placed to patient # [phone] to give him an update of PCS services. No answer. Call dropped after several rings.

## 2017-01-24 NOTE — Telephone Encounter (Signed)
Call placed to the patient to discuss his loss of PCS services. He stated that he has submitted an appeal but has not heard back from [employer].  He noted that he has difficulty walking due to knee pain and he has an appointment to see orthopedics on 01/27/17.  Call placed to [facility] to inquire about the status of the appeal. Spoke to [place] who stated that the appeal was just entered into their system today.  She said that he will continue to receive the PCS hours that he had prior to the denial until remediation and remediation has not been scheduled yet. .  Call placed to the patient and informed him of the call with [facility]. He was pleased to know that his PCS hours would be continuing for now .

## 2017-01-27 ENCOUNTER — Ambulatory Visit (INDEPENDENT_AMBULATORY_CARE_PROVIDER_SITE_OTHER): Payer: Medicare Other | Admitting: Orthopaedic Surgery

## 2017-01-27 ENCOUNTER — Encounter (INDEPENDENT_AMBULATORY_CARE_PROVIDER_SITE_OTHER): Payer: Self-pay | Admitting: Orthopaedic Surgery

## 2017-01-27 DIAGNOSIS — M1711 Unilateral primary osteoarthritis, right knee: Secondary | ICD-10-CM | POA: Diagnosis not present

## 2017-01-27 MED ORDER — METHYLPREDNISOLONE ACETATE 40 MG/ML IJ SUSP
40.0000 mg | INTRAMUSCULAR | Status: AC | PRN
  Administered 2017-01-27: 40 mg via INTRA_ARTICULAR

## 2017-01-27 MED ORDER — BUPIVACAINE HCL 0.5 % IJ SOLN
2.0000 mL | INTRAMUSCULAR | Status: AC | PRN
  Administered 2017-01-27: 2 mL via INTRA_ARTICULAR

## 2017-01-27 MED ORDER — LIDOCAINE HCL 1 % IJ SOLN
2.0000 mL | INTRAMUSCULAR | Status: AC | PRN
  Administered 2017-01-27: 2 mL

## 2017-01-27 NOTE — Progress Notes (Signed)
Office Visit Note   Patient: Jacob [name]           Date of Birth: 1955-11-06           MRN: [id number] Visit Date: 01/27/2017              Requested by: [name], MD [address] [place], [place] [address] PCP: [name], MD   Assessment & Plan: Visit Diagnoses:  1. Unilateral primary osteoarthritis, right knee     Plan: Impression is right knee degenerative joint disease.  Cortisone injection was performed today.  Questions encouraged and answered.  Follow-up as needed.  Follow-Up Instructions: Return if symptoms worsen or fail to improve.   Orders:  No orders of the defined types were placed in this encounter.  No orders of the defined types were placed in this encounter.     Procedures: Large Joint Inj: R knee on 01/27/2017 7:08 PM Indications: pain Details: 22 G needle  Arthrogram: No  Medications: 40 mg methylPREDNISolone acetate 40 MG/ML; 2 mL lidocaine 1 %; 2 mL bupivacaine 0.5 % Consent was given by the patient. Patient was prepped and draped in the usual sterile fashion.       Clinical Data: No additional findings.   Subjective: Chief Complaint  Patient presents with  . Right Knee - Pain    Patient is a 61 year old gentleman comes in with right knee pain for the last 2 months.  He denies any injuries.  His pain is severe but does not radiate and denies any numbness and tingling.  The pain is worse with prolonged activity and better with rest.  Denies any significant swelling.    Review of Systems  Constitutional: Negative.   All other systems reviewed and are negative.    Objective: Vital Signs: There were no vitals taken for this visit.  Physical Exam  Constitutional: He is oriented to person, place, and time. He appears well-developed and well-nourished.  HENT:  Head: Normocephalic and atraumatic.  Eyes: Pupils are equal, round, and reactive to light.  Neck: Neck supple.  Pulmonary/Chest: Effort normal.    Abdominal: Soft.  Musculoskeletal: Normal range of motion.  Neurological: He is alert and oriented to person, place, and time.  Skin: Skin is warm.  Psychiatric: He has a normal mood and affect. His behavior is normal. Judgment and thought content normal.  Nursing note and vitals reviewed.   Ortho Exam Right knee exam shows no joint effusion.  Collaterals and cruciates are stable.  No joint line tenderness. Specialty Comments:  No specialty comments available.  Imaging: No results found.   PMFS History: Patient Active Problem List   Diagnosis Date Noted  . Acute on chronic systolic (congestive) heart failure (HCC) 12/17/2016  . Hyperglycemia 12/02/2016  . Osteoarthritis of right knee 12/02/2016  . Congestive heart disease (HCC) 01/01/2016  . Diabetic neuropathy (HCC) 11/28/2015  . Ascites 11/09/2015  . CHF (congestive heart failure) (HCC) 11/09/2015  . Insomnia 04/18/2015  . Claudication of right lower extremity (HCC) 04/07/2015  . Cardiomyopathy, ischemic 04/07/2015  . Chronic anticoagulation 03/30/2015  . Cardiorenal syndrome with renal failure   . SOB (shortness of breath)   . Morbid obesity due to excess calories (HCC)   . Helicobacter pylori ab+ 12/12/2014  . Calf pain   . Duodenal ulcer with hemorrhage   . Hemorrhagic shock (HCC)   . Cardiogenic shock (HCC)   . Diabetes mellitus (HCC)   . Coronary artery disease involving native coronary  artery of native heart without angina pectoris   . Atrial fibrillation with rapid ventricular response (HCC) 10/29/2014  . Chest pain   . Atherosclerosis of native arteries of extremity with intermittent claudication (HCC) 09/06/2014  . Atrial fibrillation with controlled ventricular response (HCC) 08/03/2014  . Bacteremia   . DM (diabetes mellitus), type 2 with peripheral vascular complications (HCC) 08/01/2014  . Anemia of chronic disease 08/01/2014  . PVD (peripheral vascular disease) (HCC) 10/29/2013  . Tobacco abuse  05/13/2013  . AF (atrial fibrillation) (HCC) 02/11/2013  . ICD - in place- BS May 2014 [facility] [place] 02/11/2013  . Microcytic anemia 01/11/2013  . CAD- s/p CABG July [facility] 12/[facility]  . Noncompliance 12/01/2011  . Essential hypertension 10/30/2006   Past Medical History:  Diagnosis Date  . Atrial fibrillation (HCC)    RVR 10/2014  . Automatic implantable cardioverter-defibrillator in situ   . CAD (coronary artery disease) Sept 2013   s/p cardiac cath showing occlusion of small RCA with collaterals  . CHF (congestive heart failure) (HCC)    20 to 25 % EF and RV dysfunction by 07/2014 echo   . Chronic anticoagulation    on xarelto.   . High cholesterol   . Hypertension   . Myocardial infarction (HCC) 2014  . Noncompliance    homelessness contributing.   . Peripheral arterial disease (HCC)   . Type II diabetes mellitus (HCC)     Family History  Problem Relation Age of Onset  . Diabetes Mother     Past Surgical History:  Procedure Laterality Date  . CARDIAC CATHETERIZATION  09/2012  . CORONARY ANGIOPLASTY WITH STENT PLACEMENT  11/2011   "1"  . CORONARY ARTERY BYPASS GRAFT  09/2012   2 vessels per patient [name]([place])   . ILIAC ARTERY STENT Right 08/30/2013  . IMPLANTABLE CARDIOVERTER DEFIBRILLATOR IMPLANT     [place] in 07/2012; [employer]   Social History   Occupational History  . Occupation: disabled  Tobacco Use  . Smoking status: Current Every Day Smoker    Packs/day: 0.50    Years: 40.00    Pack years: 20.00    Types: Cigarettes  . Smokeless tobacco: Never Used  Substance and Sexual Activity  . Alcohol use: No    Alcohol/week: 0.0 oz  . Drug use: No  . Sexual activity: No

## 2017-01-28 ENCOUNTER — Other Ambulatory Visit (HOSPITAL_COMMUNITY): Payer: Self-pay | Admitting: *Deleted

## 2017-01-28 DIAGNOSIS — I482 Chronic atrial fibrillation, unspecified: Secondary | ICD-10-CM

## 2017-01-28 MED ORDER — RIVAROXABAN 20 MG PO TABS: ORAL_TABLET | ORAL | 2 refills | Status: DC

## 2017-02-11 ENCOUNTER — Encounter: Payer: Self-pay | Admitting: Family Medicine

## 2017-02-11 ENCOUNTER — Ambulatory Visit: Payer: Medicare Other | Attending: Family Medicine | Admitting: Family Medicine

## 2017-02-11 VITALS — BP 137/83 | HR 96 | Temp 97.6°F | Ht 65.0 in | Wt 244.0 lb

## 2017-02-11 DIAGNOSIS — E7800 Pure hypercholesterolemia, unspecified: Secondary | ICD-10-CM | POA: Diagnosis not present

## 2017-02-11 DIAGNOSIS — I2510 Atherosclerotic heart disease of native coronary artery without angina pectoris: Secondary | ICD-10-CM | POA: Diagnosis not present

## 2017-02-11 DIAGNOSIS — E1165 Type 2 diabetes mellitus with hyperglycemia: Secondary | ICD-10-CM | POA: Insufficient documentation

## 2017-02-11 DIAGNOSIS — I10 Essential (primary) hypertension: Secondary | ICD-10-CM

## 2017-02-11 DIAGNOSIS — I779 Disorder of arteries and arterioles, unspecified: Secondary | ICD-10-CM

## 2017-02-11 DIAGNOSIS — I482 Chronic atrial fibrillation: Secondary | ICD-10-CM | POA: Insufficient documentation

## 2017-02-11 DIAGNOSIS — I252 Old myocardial infarction: Secondary | ICD-10-CM | POA: Diagnosis not present

## 2017-02-11 DIAGNOSIS — Z95810 Presence of automatic (implantable) cardiac defibrillator: Secondary | ICD-10-CM | POA: Diagnosis not present

## 2017-02-11 DIAGNOSIS — I5042 Chronic combined systolic (congestive) and diastolic (congestive) heart failure: Secondary | ICD-10-CM | POA: Diagnosis not present

## 2017-02-11 DIAGNOSIS — Z951 Presence of aortocoronary bypass graft: Secondary | ICD-10-CM | POA: Insufficient documentation

## 2017-02-11 DIAGNOSIS — Z79899 Other long term (current) drug therapy: Secondary | ICD-10-CM | POA: Diagnosis not present

## 2017-02-11 DIAGNOSIS — Z7901 Long term (current) use of anticoagulants: Secondary | ICD-10-CM | POA: Insufficient documentation

## 2017-02-11 DIAGNOSIS — Z794 Long term (current) use of insulin: Secondary | ICD-10-CM | POA: Diagnosis not present

## 2017-02-11 DIAGNOSIS — E1149 Type 2 diabetes mellitus with other diabetic neurological complication: Secondary | ICD-10-CM

## 2017-02-11 DIAGNOSIS — E1151 Type 2 diabetes mellitus with diabetic peripheral angiopathy without gangrene: Secondary | ICD-10-CM | POA: Insufficient documentation

## 2017-02-11 DIAGNOSIS — M1711 Unilateral primary osteoarthritis, right knee: Secondary | ICD-10-CM

## 2017-02-11 DIAGNOSIS — I110 Hypertensive heart disease with heart failure: Secondary | ICD-10-CM | POA: Diagnosis not present

## 2017-02-11 DIAGNOSIS — E118 Type 2 diabetes mellitus with unspecified complications: Secondary | ICD-10-CM | POA: Diagnosis not present

## 2017-02-11 LAB — GLUCOSE, POCT (MANUAL RESULT ENTRY): POC Glucose: 229 mg/dl — AB (ref 70–99)

## 2017-02-11 MED ORDER — GLIPIZIDE 10 MG PO TABS: 10.0000 mg | ORAL_TABLET | Freq: Two times a day (BID) | ORAL | 5 refills | Status: DC

## 2017-02-11 NOTE — Progress Notes (Signed)
Subjective:  Patient ID: Jacob [name], male    DOB: November 26, 1955  Age: 61 y.o. 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  He was referred to orthopedics at his last office visit and underwent right knee cortisone injection by Dr.Xu on 01/27/17 which he tolerated well and reports significant improvement in symptoms. He denies knee pain or knee swelling. He has been compliant with all his other medications.  Past Medical History:  Diagnosis Date  . Atrial fibrillation (HCC)    RVR 10/2014  . Automatic implantable cardioverter-defibrillator in situ   . CAD (coronary artery disease) Sept 2013   s/p cardiac cath showing occlusion of small RCA with collaterals  . CHF (congestive heart failure) (HCC)    20 to 25 % EF and RV dysfunction by 07/2014 echo   . Chronic anticoagulation    on xarelto.   . High cholesterol   . Hypertension   . Myocardial infarction (HCC) 2014  . Noncompliance    homelessness contributing.   . Peripheral arterial disease (HCC)   . Type II diabetes mellitus (HCC)     Past Surgical History:  Procedure Laterality Date  . CARDIAC CATHETERIZATION  09/2012  . CORONARY ANGIOPLASTY WITH STENT PLACEMENT  11/2011   "1"  . CORONARY ARTERY BYPASS GRAFT  09/2012   2 vessels per patient [name]   . ESOPHAGOGASTRODUODENOSCOPY N/A 11/28/2014   Procedure: ESOPHAGOGASTRODUODENOSCOPY (EGD);  Surgeon: Beverley Fiedler, MD;  Location: [facility] ENDOSCOPY;  Service: Endoscopy;  Laterality: N/A;  . ILIAC ARTERY STENT Right 08/30/2013  . IMPLANTABLE CARDIOVERTER DEFIBRILLATOR  IMPLANT     [place] in 07/2012; [employer]  . LEFT AND RIGHT HEART CATHETERIZATION WITH CORONARY ANGIOGRAM N/A 12/02/2011   Procedure: LEFT AND RIGHT HEART CATHETERIZATION WITH CORONARY ANGIOGRAM;  Surgeon: Kathleene Hazel, MD;  Location: [facility] CATH LAB;  Service: Cardiovascular;  Laterality: N/A;  . LOWER EXTREMITY ANGIOGRAM N/A 08/30/2013   Procedure: LOWER EXTREMITY ANGIOGRAM;  Surgeon: Runell Gess, MD;  Location: [facility] CATH LAB;  Service: Cardiovascular;  Laterality: N/A;  . LOWER EXTREMITY ANGIOGRAM N/A 12/02/2013   Procedure: LOWER EXTREMITY ANGIOGRAM;  Surgeon: Runell Gess, MD;  Location: [facility] CATH LAB;  Service: Cardiovascular;  Laterality: N/A;      Outpatient Medications Prior to Visit  Medication Sig Dispense Refill  . ACCU-CHEK SOFTCLIX LANCETS lancets Use for once daily testing of blood sugar 100 each 12  . acetaminophen-codeine (TYLENOL #4) 300-60 MG tablet Take 1 tablet by mouth every 4 (four) hours as needed for pain.  0  . albuterol (PROVENTIL HFA;VENTOLIN HFA) 108 (90 Base) MCG/ACT inhaler Inhale 2 puffs into the lungs every 6 (six) hours as needed for wheezing or shortness of breath. 1 Inhaler 2  . albuterol (PROVENTIL) (2.5 MG/3ML) 0.083% nebulizer solution Take 3 mLs (2.5 mg total) by nebulization every 6 (six) hours as needed for wheezing or shortness of breath. 150 mL 1  . allopurinol (ZYLOPRIM) 100 MG tablet Take 2 tablets (200 mg total) by mouth daily. 60 tablet 5  . amiodarone (PACERONE) 200 MG tablet take 1 TABLET BY MOUTH EVERY DAY  every morning 28 tablet 3  . atorvastatin (LIPITOR) 40 MG tablet Take 1 tablet (40 mg total) by mouth daily at 6 PM. (Patient taking differently: Take 40 mg by mouth daily. ) 30 tablet 5  . Blood Glucose Monitoring Suppl (ACCU-CHEK AVIVA) device Use as instructed1 times daily before meals 1 each 0  . cetirizine (ZYRTEC) 10 MG tablet Take 1 tablet (10 mg total) by mouth daily. 30 tablet 1  . diclofenac sodium (VOLTAREN) 1 % GEL  Apply 4 g topically 4 (four) times daily. 100 g 1  . digoxin (LANOXIN) 0.125 MG tablet Take 0.5 tablets (0.0625 mg total) by mouth daily. 14 tablet 2  . gabapentin (NEURONTIN) 300 MG capsule Take 1 capsule (300 mg total) by mouth 2 (two) times daily. 60 capsule 5  . glucose blood (ACCU-CHEK AVIVA) test strip Use as instructed for 1 times daily testing of blood sugar 100 each 12  . hydrALAZINE (APRESOLINE) 25 MG tablet take 1 TABLET BY MOUTH THREE TIMES DAILY every morning, noon,evening 90 tablet 3  . Insulin Glargine (LANTUS) 100 UNIT/ML Solostar Pen Inject 25 Units into the skin daily at 10 pm. 4 pen 5  . Insulin Pen Needle 31G X 5 MM MISC Use as directed for once daily insulin injection 100 each 2  . isosorbide mononitrate (IMDUR) 30 MG 24 hr tablet Take 1 tablet (30 mg total) by mouth daily. 30 tablet 3  . Lancet Devices (ACCU-CHEK SOFTCLIX) lancets Use as instructed for once daily testing of blood sugar 1 each 0  . meclizine (ANTIVERT) 25 MG tablet Take 25 mg by mouth 2 (two) times daily as needed for nausea.    . metolazone (ZAROXOLYN) 2.5 MG tablet Take 1 tablet once as directed by CHF clinic. 3 tablet 0  . rivaroxaban (XARELTO) 20 MG TABS tablet take one 20mg  tablet daily by mouth 28 tablet 2  . spironolactone (ALDACTONE) 25 MG tablet TAKE 1 TABLET BY MOUTH DAILY. 90 tablet 4  . torsemide (DEMADEX) 20 MG tablet Take 4 tablets (80 mg total) by mouth 2 (two) times daily. 240 tablet 3  . glipiZIDE (GLUCOTROL) 10 MG tablet Take 10 mg by mouth daily before breakfast.    . traMADol (ULTRAM) 50 MG tablet Take 50 mg by mouth every 12 (twelve) hours as needed for pain.  0   No facility-administered medications prior to visit.     ROS Review of Systems  Constitutional: Negative for activity change and appetite change.  HENT: Negative for sinus pressure and sore throat.   Respiratory: Negative for chest tightness, shortness of breath and wheezing.   Cardiovascular: Negative for chest pain and  palpitations.  Gastrointestinal: Negative for abdominal distention, abdominal pain and constipation.  Genitourinary: Negative.   Musculoskeletal:       See hpi  Psychiatric/Behavioral: Negative for behavioral problems and dysphoric mood.    Objective:  BP 137/83   Pulse 96   Temp 97.6 F (36.4 C) (Oral)   Ht 5\' 5"  (1.651 m)   Wt 244 lb (110.7 kg)   SpO2 99%   BMI 40.60 kg/m   BP/Weight 02/11/2017 01/17/2017 01/14/2017  Systolic BP 137 131 118  Diastolic BP 83 86 70  Wt. (Lbs) 244 243.2 243  BMI 40.6 40.47 43.05      Physical Exam  Constitutional: He is oriented to person, place, and time. He appears well-developed and well-nourished.  Cardiovascular: Normal rate and normal heart sounds.  No murmur heard. Unable to palpate dorsalis pedis bilaterally  Pulmonary/Chest: Effort normal and breath sounds normal. He has no wheezes. He has no rales. He exhibits no tenderness.  Abdominal: Soft. Bowel sounds are normal. He exhibits no distension and no mass. There is no tenderness.  Musculoskeletal:       Right knee: He exhibits normal range of motion, no swelling and no LCL laxity.       Left knee: He exhibits normal range of motion, no swelling and no LCL laxity. No tenderness found.  Neurological: He is alert and oriented to person, place, and time.    Lab Results  Component Value Date   HGBA1C 8.5 (H) 12/17/2016    Assessment & Plan:   1. Type 2 diabetes mellitus with complication, unspecified whether long term insulin use (HCC) Uncontrolled with A1c of 8.5 Anticipate hyperglycemia due to recent cortisone injection Increase Lantus to 30 units at bedtime Diabetic diet - POCT glucose (manual entry) - glipiZIDE (GLUCOTROL) 10 MG tablet; Take 1 tablet (10 mg total) by mouth 2 (two) times daily before a meal.  Dispense: 60 tablet; Refill: 5  2. Essential hypertension Likely above target blood pressure of less than 130/80 No regimen changes today Low-sodium diet  3.  Primary osteoarthritis of right knee Status post cortisone injection Improved Continue Voltaren gel, tramadol   Meds ordered this encounter  Medications  . glipiZIDE (GLUCOTROL) 10 MG tablet    Sig: Take 1 tablet (10 mg total) by mouth 2 (two) times daily before a meal.    Dispense:  60 tablet    Refill:  5    Discontinue previous dose    Follow-up: Return in about 3 months (around 05/14/2017) for Follow-up on diabetes mellitus.   Jaclyn Shaggy MD

## 2017-02-12 ENCOUNTER — Telehealth: Payer: Self-pay

## 2017-02-12 ENCOUNTER — Other Ambulatory Visit (HOSPITAL_COMMUNITY): Payer: Self-pay

## 2017-02-12 ENCOUNTER — Encounter: Payer: Self-pay | Admitting: Family Medicine

## 2017-02-12 MED ORDER — INSULIN GLARGINE 100 UNIT/ML SOLOSTAR PEN: 30.0000 [IU] | PEN_INJECTOR | Freq: Every day | SUBCUTANEOUS | 5 refills | Status: DC

## 2017-02-12 NOTE — Progress Notes (Signed)
Paramedicine Encounter    Patient ID: Jacob [name], male    DOB: December 29, 1955, 61 y.o.   MRN: [id number]    Patient Care Team: [name], MD as PCP - General (Internal Medicine) Clarisa Schools, RN as Registered Nurse Jena Gauss, Gerrit Friends, MD as Consulting Physician (Gastroenterology)  Patient Active Problem List   Diagnosis Date Noted  . Acute on chronic systolic (congestive) heart failure (HCC) 12/17/2016  . Hyperglycemia 12/02/2016  . Osteoarthritis of right knee 12/02/2016  . Congestive heart disease (HCC) 01/01/2016  . Diabetic neuropathy (HCC) 11/28/2015  . Ascites 11/09/2015  . CHF (congestive heart failure) (HCC) 11/09/2015  . Insomnia 04/18/2015  . Claudication of right lower extremity (HCC) 04/07/2015  . Cardiomyopathy, ischemic 04/07/2015  . Chronic anticoagulation 03/30/2015  . Cardiorenal syndrome with renal failure   . SOB (shortness of breath)   . Morbid obesity due to excess calories (HCC)   . Helicobacter pylori ab+ 12/12/2014  . Calf pain   . Duodenal ulcer with hemorrhage   . Hemorrhagic shock (HCC)   . Cardiogenic shock (HCC)   . Diabetes mellitus (HCC)   . Coronary artery disease involving native coronary artery of native heart without angina pectoris   . Atrial fibrillation with rapid ventricular response (HCC) 10/29/2014  . Chest pain   . Atherosclerosis of native arteries of extremity with intermittent claudication (HCC) 09/06/2014  . Atrial fibrillation with controlled ventricular response (HCC) 08/03/2014  . Bacteremia   . DM (diabetes mellitus), type 2 with peripheral vascular complications (HCC) 08/01/2014  . Anemia of chronic disease 08/01/2014  . PVD (peripheral vascular disease) (HCC) 10/29/2013  . Tobacco abuse 05/13/2013  . AF (atrial fibrillation) (HCC) 02/11/2013  . ICD - in place- BS May 2014 [facility] 02/11/2013  . Microcytic anemia 01/11/2013  . CAD- s/p CABG July 2014 [facility] 12/04/2011  . Noncompliance 12/01/2011  .  Essential hypertension 10/30/2006    Current Outpatient Medications:  .  allopurinol (ZYLOPRIM) 100 MG tablet, Take 2 tablets (200 mg total) by mouth daily., Disp: 60 tablet, Rfl: 5 .  amiodarone (PACERONE) 200 MG tablet, take 1 TABLET BY MOUTH EVERY DAY every morning, Disp: 28 tablet, Rfl: 3 .  atorvastatin (LIPITOR) 40 MG tablet, Take 1 tablet (40 mg total) by mouth daily at 6 PM. (Patient taking differently: Take 40 mg by mouth daily. ), Disp: 30 tablet, Rfl: 5 .  digoxin (LANOXIN) 0.125 MG tablet, Take 0.5 tablets (0.0625 mg total) by mouth daily., Disp: 14 tablet, Rfl: 2 .  gabapentin (NEURONTIN) 300 MG capsule, Take 1 capsule (300 mg total) by mouth 2 (two) times daily., Disp: 60 capsule, Rfl: 5 .  glipiZIDE (GLUCOTROL) 10 MG tablet, Take 1 tablet (10 mg total) by mouth 2 (two) times daily before a meal., Disp: 60 tablet, Rfl: 5 .  hydrALAZINE (APRESOLINE) 25 MG tablet, take 1 TABLET BY MOUTH THREE TIMES DAILY every morning, noon,evening, Disp: 90 tablet, Rfl: 3 .  isosorbide mononitrate (IMDUR) 30 MG 24 hr tablet, Take 1 tablet (30 mg total) by mouth daily., Disp: 30 tablet, Rfl: 3 .  rivaroxaban (XARELTO) 20 MG TABS tablet, take one 20mg  tablet daily by mouth, Disp: 28 tablet, Rfl: 2 .  spironolactone (ALDACTONE) 25 MG tablet, TAKE 1 TABLET BY MOUTH DAILY., Disp: 90 tablet, Rfl: 4 .  torsemide (DEMADEX) 20 MG tablet, Take 4 tablets (80 mg total) by mouth 2 (two) times daily., Disp: 240 tablet, Rfl: 3 .  ACCU-CHEK SOFTCLIX LANCETS lancets, Use for  once daily testing of blood sugar, Disp: 100 each, Rfl: 12 .  acetaminophen-codeine (TYLENOL #4) 300-60 MG tablet, Take 1 tablet by mouth every 4 (four) hours as needed for pain., Disp: , Rfl: 0 .  albuterol (PROVENTIL HFA;VENTOLIN HFA) 108 (90 Base) MCG/ACT inhaler, Inhale 2 puffs into the lungs every 6 (six) hours as needed for wheezing or shortness of breath., Disp: 1 Inhaler, Rfl: 2 .  albuterol (PROVENTIL) (2.5 MG/3ML) 0.083% nebulizer  solution, Take 3 mLs (2.5 mg total) by nebulization every 6 (six) hours as needed for wheezing or shortness of breath., Disp: 150 mL, Rfl: 1 .  Blood Glucose Monitoring Suppl (ACCU-CHEK AVIVA) device, Use as instructed1 times daily before meals, Disp: 1 each, Rfl: 0 .  cetirizine (ZYRTEC) 10 MG tablet, Take 1 tablet (10 mg total) by mouth daily., Disp: 30 tablet, Rfl: 1 .  diclofenac sodium (VOLTAREN) 1 % GEL, Apply 4 g topically 4 (four) times daily., Disp: 100 g, Rfl: 1 .  glucose blood (ACCU-CHEK AVIVA) test strip, Use as instructed for 1 times daily testing of blood sugar, Disp: 100 each, Rfl: 12 .  Insulin Glargine (LANTUS) 100 UNIT/ML Solostar Pen, Inject 25 Units into the skin daily at 10 pm., Disp: 4 pen, Rfl: 5 .  Insulin Pen Needle 31G X 5 MM MISC, Use as directed for once daily insulin injection, Disp: 100 each, Rfl: 2 .  Lancet Devices (ACCU-CHEK SOFTCLIX) lancets, Use as instructed for once daily testing of blood sugar, Disp: 1 each, Rfl: 0 .  meclizine (ANTIVERT) 25 MG tablet, Take 25 mg by mouth 2 (two) times daily as needed for nausea., Disp: , Rfl:  .  metolazone (ZAROXOLYN) 2.5 MG tablet, Take 1 tablet once as directed by CHF clinic., Disp: 3 tablet, Rfl: 0 .  traMADol (ULTRAM) 50 MG tablet, Take 50 mg by mouth every 12 (twelve) hours as needed for pain., Disp: , Rfl: 0 Allergies  Allergen Reactions  . Pork-Derived Products Other (See Comments)    Religious preference     Social History   Socioeconomic History  . Marital status: Divorced    Spouse name: Not on file  . Number of children: 3  . Years of education: Not on file  . Highest education level: Not on file  Social Needs  . Financial resource strain: Not on file  . Food insecurity - worry: Not on file  . Food insecurity - inability: Not on file  . Transportation needs - medical: Not on file  . Transportation needs - non-medical: Not on file  Occupational History  . Occupation: disabled  Tobacco Use  .  Smoking status: Current Every Day Smoker    Packs/day: 0.50    Years: 40.00    Pack years: 20.00    Types: Cigarettes  . Smokeless tobacco: Never Used  Substance and Sexual Activity  . Alcohol use: No    Alcohol/week: 0.0 oz  . Drug use: No  . Sexual activity: No  Other Topics Concern  . Not on file  Social History Narrative   Has an apartment with a roommate. He was living on the streets in 23-Jan-2013.  He reports that his father died in [place] in 2013/01/23.  He is divorced.  He is no longer estranged from his son, but still from his daughter who lives locally.  Neither of his parents, nor any siblings have any history of CAD.    Physical Exam  Pulmonary/Chest: No respiratory distress.  Abdominal: He exhibits  no distension. There is no tenderness. There is no guarding.  Musculoskeletal: He exhibits no edema.  Skin: Skin is warm and dry. He is not diaphoretic.        Future Appointments  Date Time Provider Department Center  05/16/2017  3:00 PM Jaclyn ShaggyAmao, [place], MD [facility] None    ATF pt CAO x4 sitting on the edge of his bed smoking a cigarette. Pt stated that he went to the doctor yesterday and he was put on glipizide also.  Pt stated that his right knee still hurts sometimes but not often.  Pt has [facility] delivering his medications monthly and hes taking the pills without difficulty.  Pt denies sob, dizziness, and chest pain. Pt stated that he hasn't had to increase pillows at night and he still wake up during the night to urinate.  We discussed him smoking and I advised him against it.   Pt stated that [facility] are no longer coming to his home to assist him and he would like additonal help.  I text Marcy SirenJackie B with [facility] and she advised him to contact another agency directly for assistance.   I called [facility] about revising his pill packs with the glizide and I will pick them up later today.  BP 118/88 (BP Location: Right Arm, Patient  Position: Sitting, Cuff Size: Normal)   Pulse 94   Resp 16   Wt 235 lb 12.8 oz (107 kg)   SpO2 98%   BMI 39.24 kg/m   Weight yesterday-236 Last visit weight-243    Gearald Stonebraker, EMT Paramedic 02/12/2017    ACTION: Home visit completed Next visit planned for dec

## 2017-02-12 NOTE — Telephone Encounter (Signed)
At request of Dr Venetia Night, call placed to the patient and informed him that she would like him to increase his lantus to 30 units every night , which is an increase from 25 units that he has been takingnight. He correctly repeated that he would start taking lantus 30 units tonight.   Call placed to [facility] Copper, EMT and informed her of the increase in insulin and note that the patient has also been informed. She explained that [employer] has stopped PCS services.  This CM to call [facility] to check on status of services.  Call placed to [facility] # [phone] . Spoke to [place] who stated that the appeal for PCS was filed on 01/20/17 and the patient should still be receiving services from [facility].  Explained to [place] that per the patient, his services have been stopped.  Jermaine noted that the patient can call [facility] to request another [facility] provider.  He also noted that a hearing date for the appeal has not been set.  Call placed to the patient to inform him that he can contact [facility] to request another [facility] provider . Voicemail message left requesting a call back to # [phone].  Call placed to [facility] Copper, EMT and notified her that the patient can call [facility] healthcare # [phone] to request another [facility] provider.  Call received from the patient. Explained the status of the PCS services and provided him with the phone # for [facility] healthcare and he said that he would call them.

## 2017-02-18 ENCOUNTER — Other Ambulatory Visit: Payer: Self-pay | Admitting: Family Medicine

## 2017-02-21 ENCOUNTER — Other Ambulatory Visit (HOSPITAL_COMMUNITY): Payer: Self-pay | Admitting: Cardiology

## 2017-02-25 ENCOUNTER — Other Ambulatory Visit (HOSPITAL_COMMUNITY): Payer: Self-pay | Admitting: Student

## 2017-03-02 ENCOUNTER — Emergency Department (HOSPITAL_COMMUNITY)
Admission: EM | Admit: 2017-03-02 | Discharge: 2017-03-03 | Disposition: A | Discharge status: Home / Self Care | Payer: Medicare Other | Attending: Emergency Medicine | Admitting: Emergency Medicine

## 2017-03-02 ENCOUNTER — Encounter (HOSPITAL_COMMUNITY): Payer: Self-pay | Admitting: Emergency Medicine

## 2017-03-02 ENCOUNTER — Emergency Department (HOSPITAL_COMMUNITY): Payer: Medicare Other

## 2017-03-02 ENCOUNTER — Other Ambulatory Visit: Payer: Self-pay

## 2017-03-02 DIAGNOSIS — R079 Chest pain, unspecified: Secondary | ICD-10-CM

## 2017-03-02 DIAGNOSIS — R0789 Other chest pain: Secondary | ICD-10-CM | POA: Insufficient documentation

## 2017-03-02 DIAGNOSIS — F17210 Nicotine dependence, cigarettes, uncomplicated: Secondary | ICD-10-CM | POA: Insufficient documentation

## 2017-03-02 DIAGNOSIS — I110 Hypertensive heart disease with heart failure: Secondary | ICD-10-CM | POA: Insufficient documentation

## 2017-03-02 DIAGNOSIS — Z951 Presence of aortocoronary bypass graft: Secondary | ICD-10-CM | POA: Insufficient documentation

## 2017-03-02 DIAGNOSIS — Z7901 Long term (current) use of anticoagulants: Secondary | ICD-10-CM | POA: Diagnosis not present

## 2017-03-02 DIAGNOSIS — E119 Type 2 diabetes mellitus without complications: Secondary | ICD-10-CM | POA: Diagnosis not present

## 2017-03-02 DIAGNOSIS — Z950 Presence of cardiac pacemaker: Secondary | ICD-10-CM | POA: Insufficient documentation

## 2017-03-02 DIAGNOSIS — I2510 Atherosclerotic heart disease of native coronary artery without angina pectoris: Secondary | ICD-10-CM | POA: Diagnosis not present

## 2017-03-02 DIAGNOSIS — I252 Old myocardial infarction: Secondary | ICD-10-CM | POA: Diagnosis not present

## 2017-03-02 DIAGNOSIS — J90 Pleural effusion, not elsewhere classified: Secondary | ICD-10-CM | POA: Diagnosis not present

## 2017-03-02 DIAGNOSIS — I5022 Chronic systolic (congestive) heart failure: Secondary | ICD-10-CM | POA: Insufficient documentation

## 2017-03-02 DIAGNOSIS — Z794 Long term (current) use of insulin: Secondary | ICD-10-CM | POA: Insufficient documentation

## 2017-03-02 DIAGNOSIS — Z79899 Other long term (current) drug therapy: Secondary | ICD-10-CM | POA: Insufficient documentation

## 2017-03-02 LAB — CBC WITH DIFFERENTIAL/PLATELET
BASOS ABS: 0.1 10*3/uL (ref 0.0–0.1)
BASOS PCT: 0 %
Eosinophils Absolute: 0.3 10*3/uL (ref 0.0–0.7)
Eosinophils Relative: 2 %
HCT: 44.7 % (ref 39.0–52.0)
Hemoglobin: 14.6 g/dL (ref 13.0–17.0)
LYMPHS ABS: 1.9 10*3/uL (ref 0.7–4.0)
Lymphocytes Relative: 14 %
MCH: 27.4 pg (ref 26.0–34.0)
MCHC: 32.7 g/dL (ref 30.0–36.0)
MCV: 83.9 fL (ref 78.0–100.0)
MONOS PCT: 7 %
Monocytes Absolute: 1 10*3/uL (ref 0.1–1.0)
NEUTROS ABS: 10.5 10*3/uL — AB (ref 1.7–7.7)
NEUTROS PCT: 77 %
PLATELETS: 209 10*3/uL (ref 150–400)
RBC: 5.33 MIL/uL (ref 4.22–5.81)
RDW: 16.7 % — AB (ref 11.5–15.5)
WBC: 13.7 10*3/uL — ABNORMAL HIGH (ref 4.0–10.5)

## 2017-03-02 LAB — BASIC METABOLIC PANEL
ANION GAP: 10 (ref 5–15)
BUN: 14 mg/dL (ref 6–20)
CALCIUM: 8.9 mg/dL (ref 8.9–10.3)
CO2: 29 mmol/L (ref 22–32)
Chloride: 96 mmol/L — ABNORMAL LOW (ref 101–111)
Creatinine, Ser: 1.19 mg/dL (ref 0.61–1.24)
Glucose, Bld: 203 mg/dL — ABNORMAL HIGH (ref 65–99)
POTASSIUM: 3.8 mmol/L (ref 3.5–5.1)
SODIUM: 135 mmol/L (ref 135–145)

## 2017-03-02 LAB — I-STAT TROPONIN, ED: TROPONIN I, POC: 0.03 ng/mL (ref 0.00–0.08)

## 2017-03-02 NOTE — ED Provider Notes (Signed)
[facility] EMERGENCY DEPARTMENT Provider Note   CSN: [id number] Arrival date & time: 03/02/17  2308     History   Chief Complaint Chief Complaint  Patient presents with  . Chest Pain    HPI Jacob [name] is a 61 y.o. male.  The history is provided by the patient and medical records.  Chest Pain      61 y.o. M with hx of AFIB on xarelto, CAD s/p CABG, HLP, HTN, PAD, DM2, presenting to the ED for chest pain.  Patient reports that about 3 hours ago he started having some pain in the left side of his chest at the site of his pacemaker/defibrillator.  He did not feel his defibrillator fire.  He denies palpitations, dizziness, weakness, SOB, fever, chills, sweats, cough.  States he has had pain like this in the past and had to get his pacemaker checked.  He denies any changes in his medications, states he has been compliant with them.  His cardiologist is Dr. Gala Romney.  Past Medical History:  Diagnosis Date  . Atrial fibrillation (HCC)    RVR 10/2014  . Automatic implantable cardioverter-defibrillator in situ   . CAD (coronary artery disease) Sept 2013   s/p cardiac cath showing occlusion of small RCA with collaterals  . CHF (congestive heart failure) (HCC)    20 to 25 % EF and RV dysfunction by 07/2014 echo   . Chronic anticoagulation    on xarelto.   . High cholesterol   . Hypertension   . Myocardial infarction (HCC) 2014  . Noncompliance    homelessness contributing.   . Peripheral arterial disease (HCC)   . Type II diabetes mellitus [facility])     Patient Active Problem List   Diagnosis Date Noted  . Acute on chronic systolic (congestive) heart failure (HCC) 12/17/2016  . Hyperglycemia 12/02/2016  . Osteoarthritis of right knee 12/02/2016  . Congestive heart disease (HCC) 01/01/2016  . Diabetic neuropathy (HCC) 11/28/2015  . Ascites 11/09/2015  . CHF (congestive heart failure) (HCC) 11/09/2015  . Insomnia 04/18/2015  . Claudication of right lower  extremity (HCC) 04/07/2015  . Cardiomyopathy, ischemic 04/07/2015  . Chronic anticoagulation 03/30/2015  . Cardiorenal syndrome with renal failure   . SOB (shortness of breath)   . Morbid obesity due to excess calories (HCC)   . Helicobacter pylori ab+ 12/12/2014  . Calf pain   . Duodenal ulcer with hemorrhage   . Hemorrhagic shock (HCC)   . Cardiogenic shock (HCC)   . Diabetes mellitus (HCC)   . Coronary artery disease involving native coronary artery of native heart without angina pectoris   . Atrial fibrillation with rapid ventricular response (HCC) 10/29/2014  . Chest pain   . Atherosclerosis of native arteries of extremity with intermittent claudication (HCC) 09/06/2014  . Atrial fibrillation with controlled ventricular response (HCC) 08/03/2014  . Bacteremia   . DM (diabetes mellitus), type 2 with peripheral vascular complications (HCC) 08/01/2014  . Anemia of chronic disease 08/01/2014  . PVD (peripheral vascular disease) (HCC) 10/29/2013  . Tobacco abuse 05/13/2013  . AF (atrial fibrillation) (HCC) 02/11/2013  . ICD - in place- BS May 2014 [facility] 02/11/2013  . Microcytic anemia 01/11/2013  . CAD- s/p CABG July 2014 [facility] 12/04/2011  . Noncompliance 12/01/2011  . Essential hypertension 10/30/2006    Past Surgical History:  Procedure Laterality Date  . CARDIAC CATHETERIZATION  09/2012  . CORONARY ANGIOPLASTY WITH STENT PLACEMENT  11/2011   "1"  . CORONARY  ARTERY BYPASS GRAFT  09/2012   2 vessels per patient [name]([place])   . ESOPHAGOGASTRODUODENOSCOPY N/A 11/28/2014   Procedure: ESOPHAGOGASTRODUODENOSCOPY (EGD);  Surgeon: Beverley FiedlerJay M Pyrtle, MD;  [facility];  Service: Endoscopy;  Laterality: N/A;  . ILIAC ARTERY STENT Right 08/30/2013  . IMPLANTABLE CARDIOVERTER DEFIBRILLATOR IMPLANT     [place] in 07/2012; [employer]  . LEFT AND RIGHT HEART CATHETERIZATION WITH CORONARY ANGIOGRAM N/A 12/02/2011   Procedure: LEFT AND RIGHT HEART CATHETERIZATION WITH CORONARY  ANGIOGRAM;  Surgeon: Kathleene Hazelhristopher D McAlhany, MD;  [facility];  Service: Cardiovascular;  Laterality: N/A;  . LOWER EXTREMITY ANGIOGRAM N/A 08/30/2013   Procedure: LOWER EXTREMITY ANGIOGRAM;  Surgeon: Runell GessJonathan J Berry, MD;  [facility];  Service: Cardiovascular;  Laterality: N/A;  . LOWER EXTREMITY ANGIOGRAM N/A 12/02/2013   Procedure: LOWER EXTREMITY ANGIOGRAM;  Surgeon: Runell GessJonathan J Berry, MD;  [facility];  Service: Cardiovascular;  Laterality: N/A;       Home Medications    Prior to Admission medications   Medication Sig Start Date End Date Taking? Authorizing Provider  ACCU-CHEK SOFTCLIX LANCETS lancets Use for once daily testing of blood sugar 11/28/15   Jaclyn ShaggyAmao, Enobong, MD  acetaminophen-codeine (TYLENOL #4) 300-60 MG tablet Take 1 tablet by mouth every 4 (four) hours as needed for pain. 12/02/16   [provider]  albuterol (PROVENTIL HFA;VENTOLIN HFA) 108 (90 Base) MCG/ACT inhaler Inhale 2 puffs into the lungs every 6 (six) hours as needed for wheezing or shortness of breath. 03/29/15   Jaclyn ShaggyAmao, Enobong, MD  albuterol (PROVENTIL) (2.5 MG/3ML) 0.083% nebulizer solution Take 3 mLs (2.5 mg total) by nebulization every 6 (six) hours as needed for wheezing or shortness of breath. 04/18/15   Jaclyn ShaggyAmao, Enobong, MD  allopurinol (ZYLOPRIM) 100 MG tablet Take 2 tablets (200 mg total) by mouth daily. 01/21/17   Bensimhon, Bevelyn Bucklesaniel R, MD  amiodarone (PACERONE) 200 MG tablet take 1 TABLET BY MOUTH EVERY DAY every morning 12/30/16   Bensimhon, Bevelyn Bucklesaniel R, MD  amiodarone (PACERONE) 200 MG tablet take 1 TABLET BY MOUTH EVERY DAY every morning 02/21/17   Bensimhon, Bevelyn Bucklesaniel R, MD  atorvastatin (LIPITOR) 40 MG tablet Take 1 tablet (40 mg total) by mouth daily at 6 PM. Patient taking differently: Take 40 mg by mouth daily.  11/11/16   Jaclyn ShaggyAmao, Enobong, MD  Blood Glucose Monitoring Suppl (ACCU-CHEK AVIVA) device Use as instructed1 times daily before meals 11/28/15   Jaclyn ShaggyAmao, Enobong, MD  cetirizine  (ZYRTEC) 10 MG tablet Take 1 tablet (10 mg total) by mouth daily. 11/11/16   Jaclyn ShaggyAmao, Enobong, MD  diclofenac sodium (VOLTAREN) 1 % GEL Apply 4 g topically 4 (four) times daily. 100/16=7 02/19/17   Jaclyn ShaggyAmao, Enobong, MD  digoxin (LANOXIN) 0.125 MG tablet Take 0.5 tablets (0.0625 mg total) by mouth daily. 01/06/17   Bensimhon, Bevelyn Bucklesaniel R, MD  gabapentin (NEURONTIN) 300 MG capsule Take 1 capsule (300 mg total) by mouth 2 (two) times daily. 11/11/16   Jaclyn ShaggyAmao, Enobong, MD  glipiZIDE (GLUCOTROL) 10 MG tablet Take 1 tablet (10 mg total) by mouth 2 (two) times daily before a meal. 02/11/17   Jaclyn ShaggyAmao, Enobong, MD  glucose blood (ACCU-CHEK AVIVA) test strip Use as instructed for 1 times daily testing of blood sugar 11/28/15   Jaclyn ShaggyAmao, Enobong, MD  hydrALAZINE (APRESOLINE) 25 MG tablet take 1 TABLET BY MOUTH THREE TIMES DAILY every morning, noon,evening 12/30/16   Laurey MoraleMcLean, Dalton S, MD  hydrALAZINE (APRESOLINE) 25 MG tablet take 1 TABLET BY MOUTH THREE TIMES  DAILY every morning, noon,evening 02/21/17   Bensimhon, Bevelyn Buckles, MD  Insulin Glargine (LANTUS) 100 UNIT/ML Solostar Pen Inject 30 Units into the skin daily at 10 pm. 02/12/17   Jaclyn Shaggy, MD  Insulin Pen Needle 31G X 5 MM MISC Use as directed for once daily insulin injection 11/11/16   Jaclyn Shaggy, MD  isosorbide mononitrate (IMDUR) 30 MG 24 hr tablet Take 1 tablet (30 mg total) by mouth daily. 12/17/16   Bensimhon, Bevelyn Buckles, MD  Lancet Devices [facility]) lancets Use as instructed for once daily testing of blood sugar 11/28/15   Jaclyn Shaggy, MD  meclizine (ANTIVERT) 25 MG tablet Take 25 mg by mouth 2 (two) times daily as needed for nausea.    [provider]  metolazone (ZAROXOLYN) 2.5 MG tablet Take 1 tablet once as directed by CHF clinic. 11/13/16   Bensimhon, Bevelyn Buckles, MD  metolazone (ZAROXOLYN) 2.5 MG tablet Take 1 tablet once as directed by CHF clinic. 02/26/17   Laurey Morale, MD  rivaroxaban Carlena Hurl) 20 MG TABS tablet take one 20mg  tablet daily by  mouth 01/28/17   Bensimhon, Bevelyn Buckles, MD  spironolactone (ALDACTONE) 25 MG tablet TAKE 1 TABLET BY MOUTH DAILY. 09/03/16   Bensimhon, Bevelyn Buckles, MD  torsemide (DEMADEX) 20 MG tablet Take 4 tablets (80 mg total) by mouth 2 (two) times daily. 12/17/16   Bensimhon, Bevelyn Buckles, MD  traMADol (ULTRAM) 50 MG tablet Take 50 mg by mouth every 12 (twelve) hours as needed for pain. 11/11/16   [provider]    Family History Family History  Problem Relation Age of Onset  . Diabetes Mother     Social History Social History   Tobacco Use  . Smoking status: Current Every Day Smoker    Packs/day: 0.50    Years: 40.00    Pack years: 20.00    Types: Cigarettes  . Smokeless tobacco: Never Used  Substance Use Topics  . Alcohol use: No    Alcohol/week: 0.0 oz  . Drug use: No     Allergies   Pork-derived products   Review of Systems Review of Systems  Cardiovascular: Positive for chest pain.  All other systems reviewed and are negative.    Physical Exam Updated Vital Signs BP (!) 154/75 (BP Location: Right Arm)   Pulse 97   Temp 98 F (36.7 C) (Oral)   Resp 15   SpO2 98%   Physical Exam  Constitutional: He is oriented to person, place, and time. He appears well-developed and well-nourished.  HENT:  Head: Normocephalic and atraumatic.  Mouth/Throat: Oropharynx is clear and moist.  Eyes: Conjunctivae and EOM are normal. Pupils are equal, round, and reactive to light.  Neck: Normal range of motion.  Cardiovascular: Normal rate, regular rhythm and normal heart sounds.  Pulmonary/Chest: Effort normal and breath sounds normal.  Midline sternotomy scar noted Pacemaker left chest wall, some tenderness along inferior borders of this; no overlying skin changes, erythema, or warmth  Abdominal: Soft. Bowel sounds are normal.  Musculoskeletal: Normal range of motion.  Neurological: He is alert and oriented to person, place, and time.  Skin: Skin is warm and dry.  Psychiatric: He has  a normal mood and affect.  Nursing note and vitals reviewed.    ED Treatments / Results  Labs (all labs ordered are listed, but only abnormal results are displayed) Labs Reviewed  DIGOXIN LEVEL - Abnormal; Notable for the following components:      Result Value   Digoxin  Level 0.4 (*)    All other components within normal limits  CBC WITH DIFFERENTIAL/PLATELET - Abnormal; Notable for the following components:   WBC 13.7 (*)    RDW 16.7 (*)    Neutro Abs 10.5 (*)    All other components within normal limits  BASIC METABOLIC PANEL - Abnormal; Notable for the following components:   Chloride 96 (*)    Glucose, Bld 203 (*)    All other components within normal limits  BRAIN NATRIURETIC PEPTIDE - Abnormal; Notable for the following components:   B Natriuretic Peptide 381.6 (*)    All other components within normal limits  I-STAT TROPONIN, ED    EKG  EKG Interpretation  Date/Time:  Sunday March 02 2017 23:19:22 EST Ventricular Rate:  101 PR Interval:    QRS Duration: 124 QT Interval:  397 QTC Calculation: 515 R Axis:   -95 Text Interpretation:  Atrial fibrillation Nonspecific IVCD with LAD Lateral infarct, old When compared with ECG of 12/17/2016, Premature ventricular complexes are less frequent Confirmed by Dione Booze ([id number]) on 03/02/2017 11:40:54 PM       Radiology Dg Chest 2 View  Result Date: 03/02/2017 CLINICAL DATA:  Tachycardia. History of hypertension, hypercholesterolemia, diabetes and atrial fibrillation. EXAM: CHEST  2 VIEW COMPARISON:  Chest radiograph December 17, 2016 FINDINGS: Cardiac silhouette is mild to moderately enlarged, similar. Calcified aortic knob. Status post median sternotomy for CABG. Pulmonary vascular congestion mild interstitial prominence. Similar small RIGHT pleural effusion. No pneumothorax. Single lead LEFT cardiac defibrillator in situ. Soft tissue planes and included osseous structures are nonsuspicious. IMPRESSION: Stable  cardiomegaly. Mild interstitial prominence compatible with pulmonary edema. Small RIGHT pleural effusion. Electronically Signed   By: Awilda Metro M.D.   On: 03/02/2017 23:50    Procedures Procedures (including critical care time)  Medications Ordered in ED Medications - No data to display   Initial Impression / Assessment and Plan / ED Course  I have reviewed the triage vital signs and the nursing notes.  Pertinent labs & imaging results that were available during my care of the patient were reviewed by me and considered in my medical decision making (see chart for details).  61 year old male here with left-sided chest pain, onset about 3 hours ago.  Pain is localized to area of his pacemaker/defibrillator.  He is not had any acute shocks today.  Denies any shortness of breath, palpitations, dizziness, or diaphoresis.  He does have extensive cardiac history.  Has some mild tenderness along the inferior border of his pacemaker but no overlying skin changes, erythema, or signs of infection.  Pacemaker was interrogated on his arrival here, he had a very brief run of SVT around 11 p.m. Which was around time of arrival.  Patient asymptomatic of this.  No other acute events noted.  Does not appear clinically fluid overloaded.  VSS.  Will obtain screening labs, CXR.  Lab work overall reassuring.  BNP slightly elevated, however this appears around his baseline when compared with prior values.  Troponin is negative.  Chest x-ray with some mild pulmonary edema.  Patient continues maintaining oxygen saturation in the high 90s on distress.  No changes in his pain or other symptoms.  At this time, given negative workup after 3 hours of somewhat atypical symptoms and no acute abnormalities noted with his pacemaker/defibrillator, I do not see any need for ongoing evaluation.  Feel patient can be discharged home to follow-up with his cardiologist.  Discussed plan with patient, he acknowledged understanding  and agreed with plan of care.  Return precautions given for new or worsening symptoms.  Patient seen and evaluated with attending physician, Dr. Adela Lank, who agrees with assessment and plan of care.  Final Clinical Impressions(s) / ED Diagnoses   Final diagnoses:  Chest pain, unspecified type    ED Discharge Orders    None       Garlon Hatchet, PA-C 03/03/17 0501    Melene Plan, DO 03/03/17 [phone]

## 2017-03-02 NOTE — ED Notes (Signed)
Pt to xray via stretcher

## 2017-03-02 NOTE — ED Triage Notes (Signed)
AICD successfully interrogated.

## 2017-03-03 ENCOUNTER — Ambulatory Visit (INDEPENDENT_AMBULATORY_CARE_PROVIDER_SITE_OTHER): Payer: Medicare Other | Admitting: *Deleted

## 2017-03-03 DIAGNOSIS — I255 Ischemic cardiomyopathy: Secondary | ICD-10-CM

## 2017-03-03 LAB — BRAIN NATRIURETIC PEPTIDE: B NATRIURETIC PEPTIDE 5: 381.6 pg/mL — AB (ref 0.0–100.0)

## 2017-03-03 LAB — DIGOXIN LEVEL: DIGOXIN LVL: 0.4 ng/mL — AB (ref 0.8–2.0)

## 2017-03-03 NOTE — Progress Notes (Signed)
Remote ICD transmission.   

## 2017-03-03 NOTE — Discharge Instructions (Signed)
Your work-up today looked good. Recommend to follow-up with your cardiologist and your primary care doctor. You can return here for any new/acute changes.

## 2017-03-05 ENCOUNTER — Telehealth (HOSPITAL_COMMUNITY): Payer: Self-pay

## 2017-03-05 ENCOUNTER — Other Ambulatory Visit (HOSPITAL_COMMUNITY): Payer: Self-pay

## 2017-03-05 ENCOUNTER — Telehealth: Payer: Self-pay

## 2017-03-05 NOTE — Telephone Encounter (Signed)
Dee with CHF paramed called to report patient with continued SOB, fatigue, weakness, and no appetite this week. States weight has been stable however abdomen distended. No cough, fever, chills, sweats noted. Will add on to CHF clinic tomorrow at 11.  Ave Filter, RN

## 2017-03-05 NOTE — Progress Notes (Signed)
Paramedicine Encounter    Patient ID: Jacob PunchesYahia [name], male    DOB: Dec 19, 1955, 61 y.o.   MRN: [id number]    Patient Care Team: Quentin AngstJegede, Olugbemiga E, MD as PCP - General (Internal Medicine) Clarisa SchoolsScott, Tim W, RN as Registered Nurse Jena Gaussourk, Gerrit Friendsobert M, MD as Consulting Physician (Gastroenterology) Pleasant, Dennard SchaumannFrances H, RN as [facility] Care Management  Patient Active Problem List   Diagnosis Date Noted  . Acute on chronic systolic (congestive) heart failure (HCC) 12/17/2016  . Hyperglycemia 12/02/2016  . Osteoarthritis of right knee 12/02/2016  . Congestive heart disease (HCC) 01/01/2016  . Diabetic neuropathy (HCC) 11/28/2015  . Ascites 11/09/2015  . CHF (congestive heart failure) (HCC) 11/09/2015  . Insomnia 04/18/2015  . Claudication of right lower extremity (HCC) 04/07/2015  . Cardiomyopathy, ischemic 04/07/2015  . Chronic anticoagulation 03/30/2015  . Cardiorenal syndrome with renal failure   . SOB (shortness of breath)   . Morbid obesity due to excess calories (HCC)   . Helicobacter pylori ab+ 12/12/2014  . Calf pain   . Duodenal ulcer with hemorrhage   . Hemorrhagic shock (HCC)   . Cardiogenic shock (HCC)   . Diabetes mellitus (HCC)   . Coronary artery disease involving native coronary artery of native heart without angina pectoris   . Atrial fibrillation with rapid ventricular response (HCC) 10/29/2014  . Chest pain   . Atherosclerosis of native arteries of extremity with intermittent claudication (HCC) 09/06/2014  . Atrial fibrillation with controlled ventricular response (HCC) 08/03/2014  . Bacteremia   . DM (diabetes mellitus), type 2 with peripheral vascular complications (HCC) 08/01/2014  . Anemia of chronic disease 08/01/2014  . PVD (peripheral vascular disease) (HCC) 10/29/2013  . Tobacco abuse 05/13/2013  . AF (atrial fibrillation) (HCC) 02/11/2013  . ICD - in place- BS [facility] [place] 02/11/2013  . Microcytic anemia 01/11/2013  . CAD- s/p CABG July  2014 [facility] 12/04/2011  . Noncompliance 12/01/2011  . Essential hypertension 10/30/2006    Current Outpatient Medications:  .  ACCU-CHEK SOFTCLIX LANCETS lancets, Use for once daily testing of blood sugar, Disp: 100 each, Rfl: 12 .  acetaminophen-codeine (TYLENOL #4) 300-60 MG tablet, Take 1 tablet by mouth every 4 (four) hours as needed for pain., Disp: , Rfl: 0 .  albuterol (PROVENTIL HFA;VENTOLIN HFA) 108 (90 Base) MCG/ACT inhaler, Inhale 2 puffs into the lungs every 6 (six) hours as needed for wheezing or shortness of breath., Disp: 1 Inhaler, Rfl: 2 .  albuterol (PROVENTIL) (2.5 MG/3ML) 0.083% nebulizer solution, Take 3 mLs (2.5 mg total) by nebulization every 6 (six) hours as needed for wheezing or shortness of breath., Disp: 150 mL, Rfl: 1 .  allopurinol (ZYLOPRIM) 100 MG tablet, Take 2 tablets (200 mg total) by mouth daily., Disp: 60 tablet, Rfl: 5 .  amiodarone (PACERONE) 200 MG tablet, take 1 TABLET BY MOUTH EVERY DAY every morning, Disp: 28 tablet, Rfl: 3 .  amiodarone (PACERONE) 200 MG tablet, take 1 TABLET BY MOUTH EVERY DAY every morning, Disp: 30 tablet, Rfl: 3 .  atorvastatin (LIPITOR) 40 MG tablet, Take 1 tablet (40 mg total) by mouth daily at 6 PM. (Patient taking differently: Take 40 mg by mouth daily. ), Disp: 30 tablet, Rfl: 5 .  Blood Glucose Monitoring Suppl (ACCU-CHEK AVIVA) device, Use as instructed1 times daily before meals, Disp: 1 each, Rfl: 0 .  cetirizine (ZYRTEC) 10 MG tablet, Take 1 tablet (10 mg total) by mouth daily., Disp: 30 tablet, Rfl: 1 .  diclofenac sodium (  VOLTAREN) 1 % GEL, Apply 4 g topically 4 (four) times daily. 100/16=7, Disp: 100 g, Rfl: 1 .  digoxin (LANOXIN) 0.125 MG tablet, Take 0.5 tablets (0.0625 mg total) by mouth daily., Disp: 14 tablet, Rfl: 2 .  gabapentin (NEURONTIN) 300 MG capsule, Take 1 capsule (300 mg total) by mouth 2 (two) times daily., Disp: 60 capsule, Rfl: 5 .  glipiZIDE (GLUCOTROL) 10 MG tablet, Take 1 tablet (10 mg total)  by mouth 2 (two) times daily before a meal., Disp: 60 tablet, Rfl: 5 .  glucose blood (ACCU-CHEK AVIVA) test strip, Use as instructed for 1 times daily testing of blood sugar, Disp: 100 each, Rfl: 12 .  hydrALAZINE (APRESOLINE) 25 MG tablet, take 1 TABLET BY MOUTH THREE TIMES DAILY every morning, noon,evening, Disp: 90 tablet, Rfl: 3 .  hydrALAZINE (APRESOLINE) 25 MG tablet, take 1 TABLET BY MOUTH THREE TIMES DAILY every morning, noon,evening, Disp: 90 tablet, Rfl: 3 .  Insulin Glargine (LANTUS) 100 UNIT/ML Solostar Pen, Inject 30 Units into the skin daily at 10 pm., Disp: 4 pen, Rfl: 5 .  Insulin Pen Needle 31G X 5 MM MISC, Use as directed for once daily insulin injection, Disp: 100 each, Rfl: 2 .  isosorbide mononitrate (IMDUR) 30 MG 24 hr tablet, Take 1 tablet (30 mg total) by mouth daily., Disp: 30 tablet, Rfl: 3 .  Lancet Devices (ACCU-CHEK SOFTCLIX) lancets, Use as instructed for once daily testing of blood sugar, Disp: 1 each, Rfl: 0 .  meclizine (ANTIVERT) 25 MG tablet, Take 25 mg by mouth 2 (two) times daily as needed for nausea., Disp: , Rfl:  .  metolazone (ZAROXOLYN) 2.5 MG tablet, Take 1 tablet once as directed by CHF clinic., Disp: 3 tablet, Rfl: 0 .  metolazone (ZAROXOLYN) 2.5 MG tablet, Take 1 tablet once as directed by CHF clinic., Disp: 3 tablet, Rfl: 0 .  rivaroxaban (XARELTO) 20 MG TABS tablet, take one 20mg  tablet daily by mouth, Disp: 28 tablet, Rfl: 2 .  spironolactone (ALDACTONE) 25 MG tablet, TAKE 1 TABLET BY MOUTH DAILY., Disp: 90 tablet, Rfl: 4 .  torsemide (DEMADEX) 20 MG tablet, Take 4 tablets (80 mg total) by mouth 2 (two) times daily., Disp: 240 tablet, Rfl: 3 .  traMADol (ULTRAM) 50 MG tablet, Take 50 mg by mouth every 12 (twelve) hours as needed for pain., Disp: , Rfl: 0 Allergies  Allergen Reactions  . Pork-Derived Products Other (See Comments)    Religious preference     Social History   Socioeconomic History  . Marital status: Divorced    Spouse name: Not  on file  . Number of children: 3  . Years of education: Not on file  . Highest education level: Not on file  Social Needs  . Financial resource strain: Not on file  . Food insecurity - worry: Not on file  . Food insecurity - inability: Not on file  . Transportation needs - medical: Not on file  . Transportation needs - non-medical: Not on file  Occupational History  . Occupation: disabled  Tobacco Use  . Smoking status: Current Every Day Smoker    Packs/day: 0.50    Years: 40.00    Pack years: 20.00    Types: Cigarettes  . Smokeless tobacco: Never Used  Substance and Sexual Activity  . Alcohol use: No    Alcohol/week: 0.0 oz  . Drug use: No  . Sexual activity: No  Other Topics Concern  . Not on file  Social History Narrative  Has an apartment with a roommate. He was living on the streets in 02/03/2013.  He reports that his father died in [place] in 2013/02/03.  He is divorced.  He is no longer estranged from his son, but still from his daughter who lives locally.  Neither of his parents, nor any siblings have any history of CAD.    Physical Exam  Pulmonary/Chest: No respiratory distress. He has no wheezes. He has no rales.  Abdominal: He exhibits no distension. There is no tenderness. There is no guarding.  Musculoskeletal: He exhibits no edema.  Skin: Skin is warm and dry. He is not diaphoretic.        Future Appointments  Date Time Provider Department Center  03/06/2017 11:00 AM [facility] PA/NP [facility] None  05/16/2017  3:00 PM Jaclyn Shaggy, MD [facility] None  06/02/2017  7:25 AM [facility] DEVICE REMOTES [facility]    ATF pt c/o tiredness for the past for about 3 days.  he stated that he went to the ED for sob and left shoulder pain on sat night. Pt stated that "they told him to go home and didn't tell him what was wrong".  Pt stated that he has taken all of his meds although there's pill packs that are not completely empty.  I will discuss the  importance of finishing one pack before opening the new one.  Pt still has home health coming to help him with personal hygeine, cooking and cleaning.  Pt stated that he no longer drives and he gave his truck to his son. Pt stated that he cant see at night now.  Pt requested an appointment with [facility] for evaluation.  He has an appointment for tomorrow.  rx packs verified/pt advised that he takes his insulin in the pm only now.  i called jane at community to see about getting his medical records faxed to Alen @ [phone] for Medicaid court. She stated that pt must sign a medical release form and send it back to her before she can fax the info that he requested.  I will stop by tomorrow and pick it up for him due to him being limited with transportation.   BP (!) 150/100 (BP Location: Right Arm, Patient Position: Sitting, Cuff Size: Normal)   Pulse 99   Resp 16   Wt 239 lb 3.2 oz (108.5 kg)   SpO2 98%   BMI 39.80 kg/m   Weight yesterday-238.2 Last visit weight-235 (11/21)    Kacee Koren, EMT Paramedic 03/05/2017    ACTION: Home visit completed Next visit planned for tomorrow at [facility] clinic

## 2017-03-05 NOTE — Telephone Encounter (Signed)
Call received from [facility] Copper, EMT noting that the patient needs to have medical records released to [facility] for an upcoming hearing  Informed her that a Release of Information form will be left a [facility] front desk for her to pick up for him to sign.

## 2017-03-06 ENCOUNTER — Telehealth: Payer: Self-pay | Admitting: Internal Medicine

## 2017-03-06 ENCOUNTER — Other Ambulatory Visit (HOSPITAL_COMMUNITY): Payer: Self-pay

## 2017-03-06 ENCOUNTER — Ambulatory Visit (HOSPITAL_COMMUNITY)
Admission: RE | Admit: 2017-03-06 | Discharge: 2017-03-06 | Disposition: A | Discharge status: Home / Self Care | Payer: Medicare Other | Source: Ambulatory Visit | Attending: Student | Admitting: Student

## 2017-03-06 ENCOUNTER — Ambulatory Visit (HOSPITAL_COMMUNITY)
Admission: RE | Admit: 2017-03-06 | Discharge: 2017-03-06 | Disposition: A | Discharge status: Home / Self Care | Payer: Medicare Other | Source: Ambulatory Visit | Attending: Cardiology | Admitting: Cardiology

## 2017-03-06 ENCOUNTER — Encounter (HOSPITAL_COMMUNITY): Payer: Self-pay

## 2017-03-06 VITALS — BP 118/74 | HR 98 | Wt 241.6 lb

## 2017-03-06 DIAGNOSIS — J90 Pleural effusion, not elsewhere classified: Secondary | ICD-10-CM | POA: Diagnosis not present

## 2017-03-06 DIAGNOSIS — Z7901 Long term (current) use of anticoagulants: Secondary | ICD-10-CM | POA: Insufficient documentation

## 2017-03-06 DIAGNOSIS — Z91199 Patient's noncompliance with other medical treatment and regimen due to unspecified reason: Secondary | ICD-10-CM

## 2017-03-06 DIAGNOSIS — R059 Cough, unspecified: Secondary | ICD-10-CM

## 2017-03-06 DIAGNOSIS — R079 Chest pain, unspecified: Secondary | ICD-10-CM

## 2017-03-06 DIAGNOSIS — E1151 Type 2 diabetes mellitus with diabetic peripheral angiopathy without gangrene: Secondary | ICD-10-CM | POA: Insufficient documentation

## 2017-03-06 DIAGNOSIS — I4819 Other persistent atrial fibrillation: Secondary | ICD-10-CM

## 2017-03-06 DIAGNOSIS — I110 Hypertensive heart disease with heart failure: Secondary | ICD-10-CM | POA: Insufficient documentation

## 2017-03-06 DIAGNOSIS — Z79899 Other long term (current) drug therapy: Secondary | ICD-10-CM | POA: Diagnosis not present

## 2017-03-06 DIAGNOSIS — E785 Hyperlipidemia, unspecified: Secondary | ICD-10-CM | POA: Insufficient documentation

## 2017-03-06 DIAGNOSIS — F17210 Nicotine dependence, cigarettes, uncomplicated: Secondary | ICD-10-CM | POA: Insufficient documentation

## 2017-03-06 DIAGNOSIS — Z951 Presence of aortocoronary bypass graft: Secondary | ICD-10-CM | POA: Diagnosis not present

## 2017-03-06 DIAGNOSIS — Z9119 Patient's noncompliance with other medical treatment and regimen: Secondary | ICD-10-CM

## 2017-03-06 DIAGNOSIS — I252 Old myocardial infarction: Secondary | ICD-10-CM | POA: Insufficient documentation

## 2017-03-06 DIAGNOSIS — E876 Hypokalemia: Secondary | ICD-10-CM | POA: Diagnosis not present

## 2017-03-06 DIAGNOSIS — I482 Chronic atrial fibrillation: Secondary | ICD-10-CM | POA: Diagnosis not present

## 2017-03-06 DIAGNOSIS — Z720 Tobacco use: Secondary | ICD-10-CM

## 2017-03-06 DIAGNOSIS — Z794 Long term (current) use of insulin: Secondary | ICD-10-CM | POA: Diagnosis not present

## 2017-03-06 DIAGNOSIS — I5042 Chronic combined systolic (congestive) and diastolic (congestive) heart failure: Secondary | ICD-10-CM

## 2017-03-06 DIAGNOSIS — R05 Cough: Secondary | ICD-10-CM

## 2017-03-06 DIAGNOSIS — I481 Persistent atrial fibrillation: Secondary | ICD-10-CM

## 2017-03-06 DIAGNOSIS — I5023 Acute on chronic systolic (congestive) heart failure: Secondary | ICD-10-CM | POA: Diagnosis not present

## 2017-03-06 DIAGNOSIS — R918 Other nonspecific abnormal finding of lung field: Secondary | ICD-10-CM | POA: Diagnosis not present

## 2017-03-06 DIAGNOSIS — I2510 Atherosclerotic heart disease of native coronary artery without angina pectoris: Secondary | ICD-10-CM | POA: Insufficient documentation

## 2017-03-06 DIAGNOSIS — Z95810 Presence of automatic (implantable) cardiac defibrillator: Secondary | ICD-10-CM | POA: Insufficient documentation

## 2017-03-06 DIAGNOSIS — R0602 Shortness of breath: Secondary | ICD-10-CM | POA: Diagnosis not present

## 2017-03-06 LAB — BASIC METABOLIC PANEL
Anion gap: 11 (ref 5–15)
BUN: 17 mg/dL (ref 6–20)
CALCIUM: 8.4 mg/dL — AB (ref 8.9–10.3)
CO2: 27 mmol/L (ref 22–32)
CREATININE: 1.18 mg/dL (ref 0.61–1.24)
Chloride: 96 mmol/L — ABNORMAL LOW (ref 101–111)
GFR calc Af Amer: >60 mL/min (ref >60)
GFR calc non Af Amer: >60 mL/min (ref >60)
GLUCOSE: 151 mg/dL — AB (ref 65–99)
Potassium: 3.4 mmol/L — ABNORMAL LOW (ref 3.5–5.1)
Sodium: 134 mmol/L — ABNORMAL LOW (ref 135–145)

## 2017-03-06 LAB — TROPONIN I: Troponin I: 0.09 ng/mL (ref <0.03)

## 2017-03-06 LAB — CBC
HCT: 42.5 % (ref 39.0–52.0)
Hemoglobin: 13.5 g/dL (ref 13.0–17.0)
MCH: 26.6 pg (ref 26.0–34.0)
MCHC: 31.8 g/dL (ref 30.0–36.0)
MCV: 83.8 fL (ref 78.0–100.0)
Platelets: 221 10*3/uL (ref 150–400)
RBC: 5.07 MIL/uL (ref 4.22–5.81)
RDW: 17.4 % — ABNORMAL HIGH (ref 11.5–15.5)
WBC: 11.6 10*3/uL — ABNORMAL HIGH (ref 4.0–10.5)

## 2017-03-06 LAB — BRAIN NATRIURETIC PEPTIDE: B NATRIURETIC PEPTIDE 5: 276.3 pg/mL — AB (ref 0.0–100.0)

## 2017-03-06 MED ORDER — POTASSIUM CHLORIDE CRYS ER 20 MEQ PO TBCR: 20.0000 meq | EXTENDED_RELEASE_TABLET | Freq: Every day | ORAL | 3 refills | Status: DC

## 2017-03-06 NOTE — Progress Notes (Signed)
Advanced Heart Failure Medication Review by a Pharmacist  Does the patient  feel that his/her medications are working for him/her?  yes  Has the patient been experiencing any side effects to the medications prescribed?  no  Does the patient measure his/her own blood pressure or blood glucose at home?  no   Does the patient have any problems obtaining medications due to transportation or finances?   no  Understanding of regimen: fair Understanding of indications: fair Potential of compliance: fair Patient understands to avoid NSAIDs. Patient understands to avoid decongestants.  Issues to address at subsequent visits: Compliance/understanding    Pharmacist comments: Jacob [name] is a pleasant 61 yo M presenting with [facility] (paramedic) who manages his medications for him on a biweekly basis. He reports good compliance with his regimen but does admit to missing 1 day of his medications this week. He did not have any other medication-related questions or concerns for me at this time but he does feel like he is building fluid.   Jacob [name]. Jacob [name], PharmD, BCPS, CPP Clinical Pharmacist Pager: [phone] Phone: [phone] 03/06/2017 11:11 AM      Time with patient: 10 minutes Preparation and documentation time: 2 minutes Total time: 12 minutes

## 2017-03-06 NOTE — Patient Instructions (Addendum)
Labs within normal limits.  Chest xray stable.  Start Potassium 20 meq tablet once daily.  Follow up 4-6 weeks.  ___________________________________________________________ [place] Code: 9000  Take all medication as prescribed the day of your appointment. Bring all medications with you to your appointment.  Do the following things EVERYDAY: 1) Weigh yourself in the morning before breakfast. Write it down and keep it in a log. 2) Take your medicines as prescribed 3) Eat low salt foods-Limit salt (sodium) to 2000 mg per day.  4) Stay as active as you can everyday 5) Limit all fluids for the day to less than 2 liters

## 2017-03-06 NOTE — Progress Notes (Signed)
Patient ID: Jacob [name], male   DOB: Apr 06, 1955, 61 y.o.   MRN: [id number]    Advanced Heart Failure Clinic Note   PCP: Jacob [name] HF Cardiologist: Dr Jacob [name]   HPI: Jacob [name] is a 61 y.o. 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   Admitted the end of August 2016  with increased dyspnea on exertion. Later found to be in cardiogenic shock . At one point on dual pressors milrinone and norepi. Diuresed with IV lasix and transitioned to po lasix. Hospital course complicated by GI bleed and A fib RVR. Loaded on amio but later placed on toprol xl for rate control. On 9/5 had EGD with duodenal bleed with clip applied. He was discharged 9/12 with D/C weight 203 pounds.   Admitted late December 2016 with increased dyspnea. Diuresed with IV lasix and transitioned to po lasix 80 mg twice a Jacob. Chronic A fib and continued on amio 200 mg twice a Jacob. Discharge weight was 205 pounds.   Admitted 10/9 through 01/12/16 with marked volume overload. Diuresed with IV lasix and milrinone. D/C weight 209 pounds. He was provided medications at discharge.   Was seen in ER recently for CP. ECG and troponin normal. Discharged home.   Pt presents today for add on visit for SOB and CP. Visited by paramedicine yesterday. He states he has had cough, chest pain, and more SOB for past several days. Continues to smoke 1/2 ppd.  CP not related to exertion, or relieved by rest. Has not had any NTG. It is a "muscular" type pain that worsens with cough and palpation.  Mild productive cough with white/yellow sputum.  Denies fevers or chills.  UOP stable on torsemide with prn metolazone.    SH: Lives in low income housing. Estranged from family  Review of systems complete and found to be negative unless listed in HPI.     SH:  Social History   Socioeconomic History  . Marital status: Divorced    Spouse name: Not on file  . Number of children: 3  . Years of education: Not on file  . Highest education level: Not on file  Social Needs  . Financial resource strain: Not on file  . Food insecurity - worry: Not on file  . Food insecurity - inability: Not on file  . Transportation needs - medical: Not on file  . Transportation needs - non-medical: Not on file  Occupational History  . Occupation: disabled  Tobacco Use  . Smoking status: Current Every Jacob Smoker    Packs/Jacob: 0.50    Years: 40.00    Pack years: 20.00    Types: Cigarettes  . Smokeless tobacco: Never Used  Substance and Sexual Activity  . Alcohol use: No    Alcohol/week: 0.0 oz  . Drug use: No  . Sexual activity: No  Other Topics Concern  . Not on file  Social History Narrative   Has an apartment with a roommate. He was living on the streets in 2013/01/04.  He reports that his father died in [place] in Jan 04, 2013.  He is divorced.  He is no longer estranged from his son, but still from his daughter who lives locally.  Neither of his parents, nor any siblings have any history of CAD.  FH:  Family History  Problem Relation Age of Onset  . Diabetes Mother     Past Medical History:  Diagnosis Date  . Atrial fibrillation ([place])    RVR 10/2014  . Automatic implantable cardioverter-defibrillator in situ   . CAD (coronary artery disease) Sept 2013   s/p cardiac cath showing occlusion of small RCA with collaterals  . CHF (congestive heart failure) ([place])    20 to 25 % EF and RV dysfunction by 07/2014 echo   . Chronic anticoagulation    on xarelto.   . High cholesterol   . Hypertension   . Myocardial infarction ([place]) 2014  . Noncompliance    homelessness contributing.   . Peripheral arterial disease ([place])   . Type II diabetes mellitus ([place])     Current Outpatient Medications  Medication Sig Dispense Refill  . ACCU-CHEK  SOFTCLIX LANCETS lancets Use for once daily testing of blood sugar 100 each 12  . acetaminophen-codeine (TYLENOL #4) 300-60 MG tablet Take 1 tablet by mouth every 4 (four) hours as needed for pain.  0  . albuterol (PROVENTIL HFA;VENTOLIN HFA) 108 (90 Base) MCG/ACT inhaler Inhale 2 puffs into the lungs every 6 (six) hours as needed for wheezing or shortness of breath. 1 Inhaler 2  . albuterol (PROVENTIL) (2.5 MG/3ML) 0.083% nebulizer solution Take 3 mLs (2.5 mg total) by nebulization every 6 (six) hours as needed for wheezing or shortness of breath. 150 mL 1  . allopurinol (ZYLOPRIM) 100 MG tablet Take 2 tablets (200 mg total) by mouth daily. 60 tablet 5  . amiodarone (PACERONE) 200 MG tablet take 1 TABLET BY MOUTH EVERY Jacob every morning 28 tablet 3  . amiodarone (PACERONE) 200 MG tablet take 1 TABLET BY MOUTH EVERY Jacob every morning 30 tablet 3  . atorvastatin (LIPITOR) 40 MG tablet Take 1 tablet (40 mg total) by mouth daily at 6 PM. 30 tablet 5  . Blood Glucose Monitoring Suppl (ACCU-CHEK AVIVA) device Use as instructed1 times daily before meals 1 each 0  . cetirizine (ZYRTEC) 10 MG tablet Take 1 tablet (10 mg total) by mouth daily. 30 tablet 1  . diclofenac sodium (VOLTAREN) 1 % GEL Apply 4 g topically 4 (four) times daily. 100/16=7 100 g 1  . digoxin (LANOXIN) 0.125 MG tablet Take 0.5 tablets (0.0625 mg total) by mouth daily. 14 tablet 2  . gabapentin (NEURONTIN) 300 MG capsule Take 1 capsule (300 mg total) by mouth 2 (two) times daily. 60 capsule 5  . glipiZIDE (GLUCOTROL) 10 MG tablet Take 1 tablet (10 mg total) by mouth 2 (two) times daily before a meal. 60 tablet 5  . glucose blood (ACCU-CHEK AVIVA) test strip Use as instructed for 1 times daily testing of blood sugar 100 each 12  . hydrALAZINE (APRESOLINE) 25 MG tablet take 1 TABLET BY MOUTH THREE TIMES DAILY every morning, noon,evening 90 tablet 3  . Insulin Glargine (LANTUS) 100 UNIT/ML Solostar Pen Inject 30 Units into the skin daily at  10 pm. 4 pen 5  . Insulin Pen Needle 31G X 5 MM MISC Use as directed for once daily insulin injection 100 each 2  . isosorbide mononitrate (IMDUR) 30 MG 24 hr tablet Take 1 tablet (30 mg total) by mouth daily. 30 tablet 3  . Lancet Devices (ACCU-CHEK SOFTCLIX) lancets Use as instructed for once daily testing of blood sugar 1 each 0  . meclizine (ANTIVERT) 25 MG tablet Take 25 mg by mouth 2 (two) times daily as needed for  nausea.    . metolazone (ZAROXOLYN) 2.5 MG tablet Take 1 tablet once as directed by CHF clinic. 3 tablet 0  . rivaroxaban (XARELTO) 20 MG TABS tablet take one '20mg'$  tablet daily by mouth 28 tablet 2  . spironolactone (ALDACTONE) 25 MG tablet TAKE 1 TABLET BY MOUTH DAILY. 90 tablet 4  . torsemide (DEMADEX) 20 MG tablet Take 4 tablets (80 mg total) by mouth 2 (two) times daily. 240 tablet 3  . traMADol (ULTRAM) 50 MG tablet Take 50 mg by mouth every 12 (twelve) hours as needed for pain.  0   No current facility-administered medications for this encounter.    Vitals:   03/06/17 1032  BP: 118/74  Pulse: 98  SpO2: 96%  Weight: 241 lb 9.6 oz (109.6 kg)    Wt Readings from Last 3 Encounters:  03/06/17 241 lb 9.6 oz (109.6 kg)  03/05/17 239 lb 3.2 oz (108.5 kg)  03/02/17 235 lb (106.6 kg)     PHYSICAL EXAM: General:Fatigued appearing. No resp difficulty. HEENT: Normal Neck: Supple. JVP 5-6. Carotids 2+ bilat; no bruits. No thyromegaly or nodule noted. Cor: PMI nondisplaced. Irregularly irregular, ? Soft S3.  Left chest tender to palpation in multiple areas. No focal point.  Lungs: CTAB, normal effort. No wheeze  Abdomen: Obese Soft, non-tender, non-distended, no HSM. No bruits or masses. +BS  Extremities: No cyanosis, clubbing, or rash. R and LLE no edema.  Neuro: Alert & orientedx3, cranial nerves grossly intact. moves all 4 extremities w/o difficulty. Affect pleasant   EKG today Afib 90 bpm with PVC. No ischemic changes.  ASSESSMENT & PLAN: 1.Acute on Chronic  Systolic Heart Failure: Echo 10/2015 EF 25-30%. Has [employer] ICD.  - NYHA II-III symptoms - Volume status stable on exam - Continue torsemide 80 mg BID.  - Can take metolazone as needed. Paramedicine following.  - Continue spiro 25 mg daily.  - Continue digoxin 0.0625 mg daily.  - Continue hydralazine 25 mg TID.  - No beta blocker with acute decompensation although he is not currently on one.  - Reinforced fluid restriction to < 2 L daily, sodium restriction to less than 2000 mg daily, and the importance of daily weights.    2. Chronic A fib - Rate controlled. On Amiodarone 200 mg daily.  - Continue Xarelto for anticoagulation - Denies bleeding. CBC today.   3. H/O GI Bleed - duodenal ulcer with clip 11/2003 - CBC today stable.    4. PAD- Last ABI 2015  - R EIA stent with known bilateral SFA occlusion.  - Follows with Dr. Gwenlyn Found: "He is not a candidate for endovascular therapy of his SFAs and would require femoropopliteal bypass grafting which I think he would be high risk for given his cardiac situation" - Continues to smoke. Encouraged complete cessation. No change.   5. Social  Issues - Resides in low income housing.  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   6. Smoking - Encouraged cessation. No change.   7. HTN - Stable on current meds  8. CP - Atypical, not related to exertion, not relieved by rest. Worse with cough and palpation.  - Troponin 0.03 earlier this week, and 0.09 today.   CP improved in clinic - Will follow up via phone/paramedicine Monday. If CP persists will plan outpatient cath. Pt knows If symptoms worsen to come to ED. - Labs stable otherwise.  - CXR with mild interstitial edema. ? Viral URI. Symptomatic treatment.   9. Hypokalemia - Add 20 meq  K daily.   Meds and labs as above. Discussed CP and alarm symptoms at length. Pt knows to report to ED with any worsening symptoms, but is comfortable going home today.   RTC 6 weeks, sooner with any  symptoms, possible R/LHC next week.   Shirley Friar, PA-C  10:47 AM   Patient seen and examined with the above-signed Advanced Practice Provider and/or Housestaff. I personally reviewed laboratory data, imaging studies and relevant notes. I independently examined the patient and formulated the important aspects of the plan. I have edited the note to reflect any of my changes or salient points. I have personally discussed the plan with the patient and/or family.  Jacob [name] has had CP for several days off/on. Symptoms mostly atypical but does have some typical features. Troponin here and in the ER recently were normal. Volume status looks ok. He has chronic AF but rate is well controlled.   CXR shows chronic interstitial prominence which has progressed from 2017. (Personally reviewed). He has had recent viral URI but now improving.  We discussed the options. We will follow over the weekend and see back early next week. If not improved will plan R/L cath. If worse, he knows to come to the ER.   I am worried he is developing amio lung toxicity. Will need to check hi-res CT and ESR. He has been on mio for rate control will likely need to stop.   Total time spent 45 minutes. Over half that time spent discussing above.   Glori Bickers, MD  10:58 AM

## 2017-03-06 NOTE — Telephone Encounter (Signed)
Call placed to patient #[phone], regarding the medical release of information that patient signed. Needed to ask patient for Elen's number and to confirm if form will be faxed to [facility]. No answer. Left message for patient to return my call.

## 2017-03-06 NOTE — Progress Notes (Signed)
Paramedicine Encounter   Patient ID: Jacob [name] , male,   DOB: [birth year],61 y.o.,  MRN: [id number]   Met patient in clinic today with provider Jacob [name] and Dr. Leonia [name].   Pt came in to the office c/o chest pain.  He requested an appointment yesterday due to sob and left shoulder pain.  Upon my arrival pt had already seen the providers due to his cc.  I was advised that pt will get a chest xray and blood work.  Depending on the results of today's test, pt maybe admitted.  Pt has no fluid retention at this time.    Time spent with patient 28mns    Dosia Yodice, EMT-Paramedic 03/06/2017   ACTION: Next visit planned for next week

## 2017-03-07 ENCOUNTER — Telehealth: Payer: Self-pay | Admitting: Internal Medicine

## 2017-03-07 ENCOUNTER — Encounter: Payer: Self-pay | Admitting: Cardiology

## 2017-03-07 NOTE — Telephone Encounter (Signed)
Patient returned my call and clarified the information regarding medical release of information. Patient stated that information needs to be sent to Vincente Liberty from the [facility] [employer] (Public assistance section). Her phone number is [phone] and fax is [phone].   Call placed to [facility] #[phone] to verify the type of information that she is requesting. No answer. Left Elen a message asking her to return my call at [phone].

## 2017-03-07 NOTE — Telephone Encounter (Signed)
Tried to contact patient again today #[phone], regarding the medical release of information form. No answer. Left patient a message to return my call at [phone].

## 2017-03-07 NOTE — Telephone Encounter (Signed)
Precious Gilding returned my call. [name] informed me that she is a Radiation protection practitioner and the reason that she needed patient's medical information is because patient was denied for [facility] services. Patient has a hearing this coming Wednesday 12/19 and they need provider's noted to proof that patient need PCS services based on his medical status. [name] asked that we faxed the last 4 office visit notes to her  [phone].  Faxed patient's information and medical notes to [name] [phone].

## 2017-03-13 ENCOUNTER — Telehealth: Payer: Self-pay | Admitting: Family Medicine

## 2017-03-13 NOTE — Telephone Encounter (Signed)
Will route to PCP 

## 2017-03-13 NOTE — Telephone Encounter (Signed)
Jacob [name] from [facility] home health care services called to request personal care services again since he was refused by medicaid.

## 2017-03-14 ENCOUNTER — Telehealth: Payer: Self-pay | Admitting: Family Medicine

## 2017-03-14 LAB — CUP PACEART REMOTE DEVICE CHECK
Battery Remaining Percentage: 96 %
HighPow Impedance: 85 Ohm
Implantable Lead Implant Date: 20140612
Implantable Lead Serial Number: 119157
Implantable Pulse Generator Implant Date: 20140612
Lead Channel Impedance Value: 417 Ohm
Lead Channel Pacing Threshold Amplitude: 0.6 V
Lead Channel Pacing Threshold Pulse Width: 0.4 ms
Lead Channel Setting Sensing Sensitivity: 0.6 mV
MDC IDC LEAD LOCATION: 753860
MDC IDC MSMT BATTERY REMAINING LONGEVITY: 90 mo
MDC IDC SESS DTM: 20181210042300
MDC IDC SET LEADCHNL RV PACING AMPLITUDE: 2 V
MDC IDC SET LEADCHNL RV PACING PULSEWIDTH: 0.4 ms
MDC IDC STAT BRADY RV PERCENT PACED: 0 %
Pulse Gen Serial Number: 108719

## 2017-03-14 NOTE — Telephone Encounter (Signed)
Call placed to [name] [facility] attorney) #[phone], to find out the outcome of patient's hearing for [facility] services. [name] informed me that patient case didn't get approved and patient removed his appeal. If wants PCS services again, he will have to reapply.

## 2017-03-14 NOTE — Telephone Encounter (Signed)
Could you please help with his PCS services? Thanks.

## 2017-03-19 ENCOUNTER — Encounter: Payer: Self-pay | Admitting: Internal Medicine

## 2017-03-21 ENCOUNTER — Telehealth: Payer: Self-pay | Admitting: Family Medicine

## 2017-03-21 NOTE — Telephone Encounter (Signed)
Returned patient's call from yesterday. Patient wanted to speak with Erskine Squibb (case manager) regarding PCS services. Informed patient that Erskine Squibb was not in the office but would be returning on Monday. Explained to patient that I was aware that his PCS services was not approved as per my conversation with [name] [facility] attorney). Informed patient that provider and I are working on a new referral (for PCS services) at the moment and we will be sending it out soon to [employer]. Patient understood and had no further questions.

## 2017-03-24 ENCOUNTER — Other Ambulatory Visit (HOSPITAL_COMMUNITY): Payer: Self-pay | Admitting: Internal Medicine

## 2017-03-24 DIAGNOSIS — I255 Ischemic cardiomyopathy: Secondary | ICD-10-CM

## 2017-03-26 ENCOUNTER — Telehealth: Payer: Self-pay

## 2017-03-26 NOTE — Telephone Encounter (Signed)
Referral for PCS faxed to [facility] 

## 2017-03-26 NOTE — Telephone Encounter (Signed)
Call received from the patient to inquire about the status of PCS referral. Informed him that the referral has been sent.  He may hear from [facility] healthcare in about a week, possibly longer due to the holidays.  He stated that he is feeling " good."  No problems reported.

## 2017-03-27 ENCOUNTER — Telehealth: Payer: Self-pay | Admitting: Family Medicine

## 2017-03-27 NOTE — Telephone Encounter (Signed)
Call placed to [facility] #[phone],regarding patient's application for PCS services. Spoke with Boneta Lucks and she informed me that the application was received and that patient has his assessment on 03/31/17.

## 2017-04-03 ENCOUNTER — Other Ambulatory Visit: Payer: Self-pay | Admitting: Family Medicine

## 2017-04-03 ENCOUNTER — Other Ambulatory Visit (HOSPITAL_COMMUNITY): Payer: Self-pay | Admitting: Internal Medicine

## 2017-04-03 DIAGNOSIS — Z794 Long term (current) use of insulin: Principal | ICD-10-CM

## 2017-04-03 DIAGNOSIS — I25811 Atherosclerosis of native coronary artery of transplanted heart without angina pectoris: Secondary | ICD-10-CM

## 2017-04-03 DIAGNOSIS — E1159 Type 2 diabetes mellitus with other circulatory complications: Secondary | ICD-10-CM

## 2017-04-04 ENCOUNTER — Telehealth: Payer: Self-pay | Admitting: Family Medicine

## 2017-04-04 ENCOUNTER — Encounter (HOSPITAL_COMMUNITY): Payer: Self-pay

## 2017-04-04 ENCOUNTER — Other Ambulatory Visit (HOSPITAL_COMMUNITY): Payer: Self-pay

## 2017-04-04 NOTE — Telephone Encounter (Signed)
Received call from Felipa Evener, EMT Paramedic, regarding patient and to give Erskine Squibb (case Production designer, theatre/television/film) an update of patient's status. Geraldine Contras mentioned that she had been over at patient's home and noticed that patient has not been doing well. Dee found bottles of soda on the floor as well as noticed that patient is eating unhealthy. Along with that [facility] stated that she found several unopened packages of his medication on the floor. Patient's blood sugar was high. Patient stated to be feeling down and is sleeping in a lot. He does go to sleep at his regular time but wakes up in the middle of the night sometimes. Patient expressed to [facility] that "this was no way to live."  [place] informed me that patient was approved for [facility] services for a year and nurse came had scheduled to see him yesterday at 8am but patient rescheduled the visit for Monday because 8am was too early. Dee asked that Erskine Squibb (CM) reach out to patient since she knows him and he trusts her. Informed Dee that I would notify Erskine Squibb as well as provider. Dee asked that Erskine Squibb return the call to her.

## 2017-04-04 NOTE — Progress Notes (Signed)
Paramedicine Encounter    Patient ID: Jacob [name], male    DOB: 09/03/1955, 62 y.o.   MRN: [id number]    Patient Care Team: Jaclyn Shaggy, MD as PCP - General (Family Medicine) Clarisa Schools, RN as Registered Nurse Jena Gauss, Gerrit Friends, MD as Consulting Physician (Gastroenterology) Pleasant, Dennard Schaumann, RN as [facility] Care Management  Patient Active Problem List   Diagnosis Date Noted  . Acute on chronic systolic (congestive) heart failure (HCC) 12/17/2016  . Hyperglycemia 12/02/2016  . Osteoarthritis of right knee 12/02/2016  . Congestive heart disease (HCC) 01/01/2016  . Diabetic neuropathy (HCC) 11/28/2015  . Ascites 11/09/2015  . CHF (congestive heart failure) (HCC) 11/09/2015  . Insomnia 04/18/2015  . Claudication of right lower extremity (HCC) 04/07/2015  . Cardiomyopathy, ischemic 04/07/2015  . Chronic anticoagulation 03/30/2015  . Cardiorenal syndrome with renal failure   . SOB (shortness of breath)   . Morbid obesity due to excess calories (HCC)   . Helicobacter pylori ab+ 12/12/2014  . Calf pain   . Duodenal ulcer with hemorrhage   . Hemorrhagic shock (HCC)   . Cardiogenic shock (HCC)   . Diabetes mellitus (HCC)   . Coronary artery disease involving native coronary artery of native heart without angina pectoris   . Atrial fibrillation with rapid ventricular response (HCC) 10/29/2014  . Chest pain   . Atherosclerosis of native arteries of extremity with intermittent claudication (HCC) 09/06/2014  . Atrial fibrillation with controlled ventricular response (HCC) 08/03/2014  . Bacteremia   . DM (diabetes mellitus), type 2 with peripheral vascular complications (HCC) 08/01/2014  . Anemia of chronic disease 08/01/2014  . PVD (peripheral vascular disease) (HCC) 10/29/2013  . Tobacco abuse 05/13/2013  . AF (atrial fibrillation) (HCC) 02/11/2013  . ICD - in place- BS May 2014 [facility] 02/11/2013  . Microcytic anemia 01/11/2013  . 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 Noncompliance 12/01/2011  . Essential hypertension 10/30/2006    Current Outpatient Medications:  .  ACCU-CHEK SOFTCLIX LANCETS lancets, Use for once daily testing of blood sugar, Disp: 100 each, Rfl: 12 .  albuterol (PROVENTIL HFA;VENTOLIN HFA) 108 (90 Base) MCG/ACT inhaler, Inhale 2 puffs into the lungs every 6 (six) hours as needed for wheezing or shortness of breath., Disp: 1 Inhaler, Rfl: 2 .  albuterol (PROVENTIL) (2.5 MG/3ML) 0.083% nebulizer solution, Take 3 mLs (2.5 mg total) by nebulization every 6 (six) hours as needed for wheezing or shortness of breath., Disp: 150 mL, Rfl: 1 .  allopurinol (ZYLOPRIM) 100 MG tablet, Take 2 tablets (200 mg total) by mouth daily., Disp: 60 tablet, Rfl: 5 .  amiodarone (PACERONE) 200 MG tablet, take 1 TABLET BY MOUTH EVERY DAY every morning, Disp: 28 tablet, Rfl: 3 .  atorvastatin (LIPITOR) 40 MG tablet, Take 1 tablet (40 mg total) by mouth daily at 6 PM., Disp: 30 tablet, Rfl: 5 .  digoxin (LANOXIN) 0.125 MG tablet, Take 0.5 tablets (0.0625 mg total) by mouth daily., Disp: 14 tablet, Rfl: 3 .  gabapentin (NEURONTIN) 300 MG capsule, Take 1 capsule (300 mg total) by mouth 2 (two) times daily., Disp: 60 capsule, Rfl: 5 .  glipiZIDE (GLUCOTROL) 10 MG tablet, Take 1 tablet (10 mg total) by mouth 2 (two) times daily before a meal., Disp: 60 tablet, Rfl: 5 .  hydrALAZINE (APRESOLINE) 25 MG tablet, take 1 TABLET BY MOUTH THREE TIMES DAILY every morning, noon,evening, Disp: 90 tablet, Rfl: 3 .  Insulin Glargine (LANTUS) 100 UNIT/ML Solostar  Pen, Inject 30 Units into the skin daily at 10 pm., Disp: 4 pen, Rfl: 5 .  isosorbide mononitrate (IMDUR) 30 MG 24 hr tablet, Take 1 tablet (30 mg total) by mouth daily., Disp: 28 tablet, Rfl: 3 .  metolazone (ZAROXOLYN) 2.5 MG tablet, Take 1 tablet once as directed by CHF clinic., Disp: 3 tablet, Rfl: 0 .  potassium chloride SA (KLOR-CON M20) 20 MEQ tablet, Take 1 tablet (20 mEq total) by mouth  daily., Disp: 90 tablet, Rfl: 3 .  rivaroxaban (XARELTO) 20 MG TABS tablet, take one 20mg  tablet daily by mouth, Disp: 28 tablet, Rfl: 2 .  spironolactone (ALDACTONE) 25 MG tablet, TAKE 1 TABLET BY MOUTH DAILY., Disp: 90 tablet, Rfl: 4 .  torsemide (DEMADEX) 20 MG tablet, Take 4 tablets (80 mg total) by mouth 2 (two) times daily., Disp: 240 tablet, Rfl: 3 .  acetaminophen-codeine (TYLENOL #4) 300-60 MG tablet, Take 1 tablet by mouth every 4 (four) hours as needed for pain., Disp: , Rfl: 0 .  Blood Glucose Monitoring Suppl (ACCU-CHEK AVIVA) device, Use as instructed1 times daily before meals, Disp: 1 each, Rfl: 0 .  cetirizine (ZYRTEC) 10 MG tablet, Take 1 tablet (10 mg total) by mouth daily., Disp: 30 tablet, Rfl: 1 .  diclofenac sodium (VOLTAREN) 1 % GEL, Apply 4 g topically 4 (four) times daily. 100/16=7, Disp: 100 g, Rfl: 1 .  glucose blood (ACCU-CHEK AVIVA) test strip, Use as instructed for 1 times daily testing of blood sugar, Disp: 100 each, Rfl: 12 .  Insulin Pen Needle 31G X 5 MM MISC, Use as directed for once daily insulin injection, Disp: 100 each, Rfl: 2 .  Lancet Devices (ACCU-CHEK SOFTCLIX) lancets, Use as instructed for once daily testing of blood sugar, Disp: 1 each, Rfl: 0 .  meclizine (ANTIVERT) 25 MG tablet, Take 25 mg by mouth 2 (two) times daily as needed for nausea., Disp: , Rfl:  .  traMADol (ULTRAM) 50 MG tablet, Take 50 mg by mouth every 12 (twelve) hours as needed for pain., Disp: , Rfl: 0 Allergies  Allergen Reactions  . Pork-Derived Products Other (See Comments)    Religious preference     Social History   Socioeconomic History  . Marital status: Divorced    Spouse name: Not on file  . Number of children: 3  . Years of education: Not on file  . Highest education level: Not on file  Social Needs  . Financial resource strain: Not on file  . Food insecurity - worry: Not on file  . Food insecurity - inability: Not on file  . Transportation needs - medical: Not  on file  . Transportation needs - non-medical: Not on file  Occupational History  . Occupation: disabled  Tobacco Use  . Smoking status: Current Every Day Smoker    Packs/day: 0.50    Years: 40.00    Pack years: 20.00    Types: Cigarettes  . Smokeless tobacco: Never Used  Substance and Sexual Activity  . Alcohol use: No    Alcohol/week: 0.0 oz  . Drug use: No  . Sexual activity: No  Other Topics Concern  . Not on file  Social History Narrative   Has an apartment with a roommate. He was living on the streets in February 09, 2013.  He reports that his father died in [place] in 02-09-2013.  He is divorced.  He is no longer estranged from his son, but still from his daughter who lives locally.  Neither of  his parents, nor any siblings have any history of CAD.    Physical Exam  Pulmonary/Chest: No respiratory distress. He has no wheezes. He has no rales.  Abdominal: He exhibits no distension. There is no guarding.  Musculoskeletal: He exhibits no edema.  Skin: Skin is warm and dry. He is not diaphoretic.        Future Appointments  Date Time Provider Department Center  04/07/2017  2:30 PM [facility] PA/NP [facility] None  05/16/2017  3:00 PM Jaclyn Shaggy, MD [facility] None  06/02/2017  7:25 AM [facility] DEVICE REMOTES [facility]    ATF pt CAO x4 sitting on the side of his bed, just waking up for the day. He stated that he has trouble sleeping at night and it's not due to using the rest room or sob. He stated that he watches tv prior to falling asleep d when he wakes up in the middle of the night.  Pt has several empty 2 liter bottles on pepsi on the floor.  Pt also hasn't been taking his meds as prescribed he has several pill packs around the apartment that still have pills in them.  I took the partially used pill packs and disposed of them at [facility] office.  When I asked him why was he drinking and eating so badly on top of not taking his medications, he advised me that he has no  will to live.  He stated that "this is no way to live and what am I living for".  I asked him if he has intentions of harming himself and he stated no.  I'm currently seeing pt once a month to check pill packs and vitals. Pt has asked me to see him biweekly again.   Pill paks is missing torsemide 40 BID and potassium/ I took all four pill paks back to [employer] and advised of the same for them to correct. I dropped the pill packs off for them to correct and dropped them back off to pt after they were completely.   Marcy Siren Social worker with [facility], was made aware via email of my conversation with pt.  He has not plan at this time and no means of harming himself.   BP 126/80 (BP Location: Left Arm, Patient Position: Sitting, Cuff Size: Normal)   Pulse 91   Resp 16   Wt 245 lb 6.4 oz (111.3 kg)   SpO2 99%   BMI 40.84 kg/m     Tamicka Shimon, EMT Paramedic 04/04/2017    ACTION: Home visit completed Next visit planned for in two weeks

## 2017-04-04 NOTE — Telephone Encounter (Signed)
He may need an office visit to be evaluated for depression.

## 2017-04-07 ENCOUNTER — Inpatient Hospital Stay (HOSPITAL_COMMUNITY): Admission: RE | Admit: 2017-04-07 | Payer: Medicare Other | Source: Ambulatory Visit

## 2017-04-07 NOTE — Telephone Encounter (Signed)
Call placed to [facility] Copper, EMT # [phone] and a HIPAA compliant voicemail message was left requesting a call back to # [phone]

## 2017-04-08 ENCOUNTER — Telehealth: Payer: Self-pay

## 2017-04-08 NOTE — Telephone Encounter (Signed)
Call received from [facility] Copper, EMT.  She was explaining the findings of her last home visit with the patient - as noted in the phone call with Lynnae Sandhoff 04/04/2017.   She stated that she usually sees the patient monthly but will be seeing him again tomorrow morning. Informed her that Dr Venetia Night suggested that the patient may need  an office visit to be evaluated for depression. She stated that she will discuss with the patient tomorrow and will call [facility] when she is with him to schedule the appointment. She stated that when she last saw the patient he had refused seeing a therapist or psychiatrist.

## 2017-04-09 ENCOUNTER — Other Ambulatory Visit (HOSPITAL_COMMUNITY): Payer: Self-pay

## 2017-04-09 NOTE — Progress Notes (Signed)
CHP f/u visit due to our last visit.  I spoke with Jacob [name] at [employer] [facility] yesterday, and I agreed to call her during our visit to schedule an appointment with his pcp.  I came by twice today but there was no answer either time.  I will check back with him by Friday.

## 2017-04-16 ENCOUNTER — Encounter (HOSPITAL_COMMUNITY): Payer: Self-pay

## 2017-04-16 ENCOUNTER — Ambulatory Visit (HOSPITAL_COMMUNITY)
Admission: RE | Admit: 2017-04-16 | Discharge: 2017-04-16 | Disposition: A | Discharge status: Home / Self Care | Payer: Medicare Other | Source: Ambulatory Visit | Attending: Internal Medicine | Admitting: Internal Medicine

## 2017-04-16 VITALS — BP 112/90 | HR 108 | Wt 244.8 lb

## 2017-04-16 DIAGNOSIS — I255 Ischemic cardiomyopathy: Secondary | ICD-10-CM

## 2017-04-16 DIAGNOSIS — F17210 Nicotine dependence, cigarettes, uncomplicated: Secondary | ICD-10-CM | POA: Diagnosis not present

## 2017-04-16 DIAGNOSIS — E876 Hypokalemia: Secondary | ICD-10-CM | POA: Diagnosis not present

## 2017-04-16 DIAGNOSIS — I252 Old myocardial infarction: Secondary | ICD-10-CM | POA: Insufficient documentation

## 2017-04-16 DIAGNOSIS — E1151 Type 2 diabetes mellitus with diabetic peripheral angiopathy without gangrene: Secondary | ICD-10-CM | POA: Insufficient documentation

## 2017-04-16 DIAGNOSIS — I5042 Chronic combined systolic (congestive) and diastolic (congestive) heart failure: Secondary | ICD-10-CM

## 2017-04-16 DIAGNOSIS — Z794 Long term (current) use of insulin: Secondary | ICD-10-CM | POA: Insufficient documentation

## 2017-04-16 DIAGNOSIS — Z79899 Other long term (current) drug therapy: Secondary | ICD-10-CM | POA: Insufficient documentation

## 2017-04-16 DIAGNOSIS — Z951 Presence of aortocoronary bypass graft: Secondary | ICD-10-CM | POA: Diagnosis not present

## 2017-04-16 DIAGNOSIS — Z596 Low income: Secondary | ICD-10-CM | POA: Insufficient documentation

## 2017-04-16 DIAGNOSIS — I110 Hypertensive heart disease with heart failure: Secondary | ICD-10-CM | POA: Insufficient documentation

## 2017-04-16 DIAGNOSIS — R0600 Dyspnea, unspecified: Secondary | ICD-10-CM | POA: Diagnosis not present

## 2017-04-16 DIAGNOSIS — I5022 Chronic systolic (congestive) heart failure: Secondary | ICD-10-CM | POA: Diagnosis not present

## 2017-04-16 DIAGNOSIS — Z7901 Long term (current) use of anticoagulants: Secondary | ICD-10-CM | POA: Insufficient documentation

## 2017-04-16 DIAGNOSIS — I4891 Unspecified atrial fibrillation: Secondary | ICD-10-CM | POA: Diagnosis not present

## 2017-04-16 DIAGNOSIS — Z720 Tobacco use: Secondary | ICD-10-CM | POA: Diagnosis not present

## 2017-04-16 DIAGNOSIS — Z95810 Presence of automatic (implantable) cardiac defibrillator: Secondary | ICD-10-CM | POA: Insufficient documentation

## 2017-04-16 DIAGNOSIS — I482 Chronic atrial fibrillation: Secondary | ICD-10-CM | POA: Diagnosis not present

## 2017-04-16 DIAGNOSIS — I10 Essential (primary) hypertension: Secondary | ICD-10-CM

## 2017-04-16 DIAGNOSIS — I2510 Atherosclerotic heart disease of native coronary artery without angina pectoris: Secondary | ICD-10-CM | POA: Diagnosis not present

## 2017-04-16 DIAGNOSIS — Z91199 Patient's noncompliance with other medical treatment and regimen due to unspecified reason: Secondary | ICD-10-CM

## 2017-04-16 DIAGNOSIS — E7800 Pure hypercholesterolemia, unspecified: Secondary | ICD-10-CM | POA: Diagnosis not present

## 2017-04-16 DIAGNOSIS — Z9119 Patient's noncompliance with other medical treatment and regimen: Secondary | ICD-10-CM

## 2017-04-16 LAB — COMPREHENSIVE METABOLIC PANEL
ALT: 23 U/L (ref 17–63)
AST: 28 U/L (ref 15–41)
Albumin: 3.4 g/dL — ABNORMAL LOW (ref 3.5–5.0)
Alkaline Phosphatase: 172 U/L — ABNORMAL HIGH (ref 38–126)
Anion gap: 12 (ref 5–15)
BUN: 39 mg/dL — ABNORMAL HIGH (ref 6–20)
CHLORIDE: 91 mmol/L — AB (ref 101–111)
CO2: 24 mmol/L (ref 22–32)
CREATININE: 1.57 mg/dL — AB (ref 0.61–1.24)
Calcium: 9.5 mg/dL (ref 8.9–10.3)
GFR calc non Af Amer: 46 mL/min — ABNORMAL LOW (ref >60)
GFR, EST AFRICAN AMERICAN: 54 mL/min — AB (ref >60)
Glucose, Bld: 439 mg/dL — ABNORMAL HIGH (ref 65–99)
Potassium: 5.7 mmol/L — ABNORMAL HIGH (ref 3.5–5.1)
SODIUM: 127 mmol/L — AB (ref 135–145)
Total Bilirubin: 0.6 mg/dL (ref 0.3–1.2)
Total Protein: 8.2 g/dL — ABNORMAL HIGH (ref 6.5–8.1)

## 2017-04-16 LAB — CBC
HCT: 41.8 % (ref 39.0–52.0)
Hemoglobin: 14.1 g/dL (ref 13.0–17.0)
MCH: 28 pg (ref 26.0–34.0)
MCHC: 33.7 g/dL (ref 30.0–36.0)
MCV: 83.1 fL (ref 78.0–100.0)
PLATELETS: 287 10*3/uL (ref 150–400)
RBC: 5.03 MIL/uL (ref 4.22–5.81)
RDW: 17.4 % — ABNORMAL HIGH (ref 11.5–15.5)
WBC: 13.1 10*3/uL — ABNORMAL HIGH (ref 4.0–10.5)

## 2017-04-16 LAB — PROTIME-INR
INR: 1.21
Prothrombin Time: 15.2 seconds (ref 11.4–15.2)

## 2017-04-16 LAB — SEDIMENTATION RATE: Sed Rate: 87 mm/hr — ABNORMAL HIGH (ref 0–16)

## 2017-04-16 NOTE — Progress Notes (Signed)
CSW met with patient in the clinic. Patient states he has some upcoming appointments and will need transportation. CSW discussed medicaid transport program and provided number. Patient's upcoming appointments on Monday and Tuesday next week will require taxi transport as medicaid requires one week notice for transport. Patient states he is doing ok at home and spends most of his time at home as he is unable to get out much for social activities. Patient denies depression but admits that he gets lonely at times. Patient has food stamps and SSI income although states that his income is only 550 monthly and his rent is 540. Patient requesting assistance with contacting [employer] to inquire about income benefits. CSW scheduled appointment for next week when he returns for further testing and will follow up with [employer] at that time. Patient appears to be coping at the moment but will continue with added support through the [facility]. CSW will discuss today's visit with [facility] paramedic and continue to follow. Raquel Sarna, [place], [place]

## 2017-04-16 NOTE — Patient Instructions (Signed)
Routine lab work today. Will notify you of abnormal results, otherwise no news is good news!  Will schedule high resolution CT chest at [facility].  You have been scheduled for a heart catheterization. Please see instruction sheet for additional details.  Follow up 2 weeks with Otilio Saber PA-C.  Take all medication as prescribed the day of your appointment. Bring all medications with you to your appointment.  Do the following things EVERYDAY: 1) Weigh yourself in the morning before breakfast. Write it down and keep it in a log. 2) Take your medicines as prescribed 3) Eat low salt foods-Limit salt (sodium) to 2000 mg per day.  4) Stay as active as you can everyday 5) Limit all fluids for the day to less than 2 liters

## 2017-04-16 NOTE — Progress Notes (Signed)
Patient ID: Jacob [name], male   DOB: [birth year], 62 y.o.   MRN: [id number]    Advanced Heart Failure Clinic Note   PCP: [facility] Primary [facility] Cardiologist: Dr Gala Romney   HPI: Jacob [name] is a 62 y.o. male [place] male with history of systolic HF EF 20-25% via Echo 08/03/14 s/p INCEPTA ICD implant 6/14, RV dysfunction, CAD s/p CABG x 2 w/ RF MAZE 7/14 at [facility], DM type II, Chronic afib on xarelto, GI bleed 11/28/2014, and HLD.   Admitted the end of August 2016  with increased dyspnea on exertion. Later found to be in cardiogenic shock . At one point on dual pressors milrinone and norepi. Diuresed with IV lasix and transitioned to po lasix. Hospital course complicated by GI bleed and A fib RVR. Loaded on amio but later placed on toprol xl for rate control. On 9/5 had EGD with duodenal bleed with clip applied. He was discharged 9/12 with D/C weight 203 pounds.   Admitted late December 2016 with increased dyspnea. Diuresed with IV lasix and transitioned to po lasix 80 mg twice a day. Chronic A fib and continued on amio 200 mg twice a day. Discharge weight was 205 pounds.   Admitted 01-21-2023 through 01/12/16 with marked volume overload. Diuresed with IV lasix and milrinone. D/C weight 209 pounds. He was provided medications at discharge.   Was seen in ER recently for CP. ECG and troponin normal. Discharged home.   Pt presents today for follow up. Last seen in December and was complaining of CP. Has since been non-compliant with follow up. He has continued to have chest pain. He does have GERD symptoms related to food in the evening, but he also has a different chest pain/pressure caused by exertion. This happens even walking around the house. It is relieved by rest.  He continues to smoke 1/2 ppd. He c/o of orthopnea and ? PND. He states he is taking all of his medication as directed.   SH: Lives in low income housing. Estranged from family  Review of systems complete and found to be  negative unless listed in HPI.    SH:  Social History   Socioeconomic History  . Marital status: Divorced    Spouse name: Not on file  . Number of children: 3  . Years of education: Not on file  . Highest education level: Not on file  Social Needs  . Financial resource strain: Not on file  . Food insecurity - worry: Not on file  . Food insecurity - inability: Not on file  . Transportation needs - medical: Not on file  . Transportation needs - non-medical: Not on file  Occupational History  . Occupation: disabled  Tobacco Use  . Smoking status: Current Every Day Smoker    Packs/day: 0.50    Years: 40.00    Pack years: 20.00    Types: Cigarettes  . Smokeless tobacco: Never Used  Substance and Sexual Activity  . Alcohol use: No    Alcohol/week: 0.0 oz  . Drug use: No  . Sexual activity: No  Other Topics Concern  . Not on file  Social History Narrative   Has an apartment with a roommate. He was living on the streets in 01-20-2013.  He reports that his father died in [place] in 2013/01/20.  He is divorced.  He is no longer estranged from his son, but still from his daughter who lives locally.  Neither of his parents, nor any siblings  have any history of CAD.    FH:  Family History  Problem Relation Age of Onset  . Diabetes Mother     Past Medical History:  Diagnosis Date  . Atrial fibrillation (HCC)    RVR 10/2014  . Automatic implantable cardioverter-defibrillator in situ   . CAD (coronary artery disease) Sept 2013   s/p cardiac cath showing occlusion of small RCA with collaterals  . CHF (congestive heart failure) (HCC)    20 to 25 % EF and RV dysfunction by 07/2014 echo   . Chronic anticoagulation    on xarelto.   . High cholesterol   . Hypertension   . Myocardial infarction (HCC) 2014  . Noncompliance    homelessness contributing.   . Peripheral arterial disease (HCC)   . Type II diabetes mellitus (HCC)     Current Outpatient Medications  Medication Sig  Dispense Refill  . ACCU-CHEK SOFTCLIX LANCETS lancets Use for once daily testing of blood sugar 100 each 12  . acetaminophen-codeine (TYLENOL #4) 300-60 MG tablet Take 1 tablet by mouth every 4 (four) hours as needed for pain.  0  . albuterol (PROVENTIL HFA;VENTOLIN HFA) 108 (90 Base) MCG/ACT inhaler Inhale 2 puffs into the lungs every 6 (six) hours as needed for wheezing or shortness of breath. 1 Inhaler 2  . albuterol (PROVENTIL) (2.5 MG/3ML) 0.083% nebulizer solution Take 3 mLs (2.5 mg total) by nebulization every 6 (six) hours as needed for wheezing or shortness of breath. 150 mL 1  . allopurinol (ZYLOPRIM) 100 MG tablet Take 2 tablets (200 mg total) by mouth daily. 60 tablet 5  . amiodarone (PACERONE) 200 MG tablet take 1 TABLET BY MOUTH EVERY DAY every morning 28 tablet 3  . atorvastatin (LIPITOR) 40 MG tablet Take 1 tablet (40 mg total) by mouth daily at 6 PM. 30 tablet 5  . Blood Glucose Monitoring Suppl (ACCU-CHEK AVIVA) device Use as instructed1 times daily before meals 1 each 0  . cetirizine (ZYRTEC) 10 MG tablet Take 1 tablet (10 mg total) by mouth daily. 30 tablet 1  . diclofenac sodium (VOLTAREN) 1 % GEL Apply 4 g topically 4 (four) times daily. 100/16=7 100 g 1  . digoxin (LANOXIN) 0.125 MG tablet Take 0.5 tablets (0.0625 mg total) by mouth daily. 14 tablet 3  . gabapentin (NEURONTIN) 300 MG capsule Take 1 capsule (300 mg total) by mouth 2 (two) times daily. 60 capsule 5  . glipiZIDE (GLUCOTROL) 10 MG tablet Take 1 tablet (10 mg total) by mouth 2 (two) times daily before a meal. 60 tablet 5  . glucose blood (ACCU-CHEK AVIVA) test strip Use as instructed for 1 times daily testing of blood sugar 100 each 12  . hydrALAZINE (APRESOLINE) 25 MG tablet take 1 TABLET BY MOUTH THREE TIMES DAILY every morning, noon,evening 90 tablet 3  . Insulin Glargine (LANTUS) 100 UNIT/ML Solostar Pen Inject 30 Units into the skin daily at 10 pm. 4 pen 5  . Insulin Pen Needle 31G X 5 MM MISC Use as directed  for once daily insulin injection 100 each 2  . isosorbide mononitrate (IMDUR) 30 MG 24 hr tablet Take 1 tablet (30 mg total) by mouth daily. 28 tablet 3  . Lancet Devices (ACCU-CHEK SOFTCLIX) lancets Use as instructed for once daily testing of blood sugar 1 each 0  . meclizine (ANTIVERT) 25 MG tablet Take 25 mg by mouth 2 (two) times daily as needed for nausea.    . metolazone (ZAROXOLYN) 2.5 MG tablet Take  1 tablet once as directed by CHF clinic. 3 tablet 0  . potassium chloride SA (KLOR-CON M20) 20 MEQ tablet Take 1 tablet (20 mEq total) by mouth daily. 90 tablet 3  . rivaroxaban (XARELTO) 20 MG TABS tablet take one 20mg  tablet daily by mouth 28 tablet 2  . spironolactone (ALDACTONE) 25 MG tablet TAKE 1 TABLET BY MOUTH DAILY. 90 tablet 4  . torsemide (DEMADEX) 20 MG tablet Take 4 tablets (80 mg total) by mouth 2 (two) times daily. 240 tablet 3  . traMADol (ULTRAM) 50 MG tablet Take 50 mg by mouth every 12 (twelve) hours as needed for pain.  0   No current facility-administered medications for this encounter.    Vitals:   04/16/17 1507  BP: 112/90  Pulse: (!) 108  SpO2: 99%  Weight: 244 lb 12.8 oz (111 kg)    Wt Readings from Last 3 Encounters:  04/16/17 244 lb 12.8 oz (111 kg)  04/04/17 245 lb 6.4 oz (111.3 kg)  03/06/17 241 lb 9.6 oz (109.6 kg)     PHYSICAL EXAM: General: Fatigued appearing. NAD.  HEENT: Normal Neck: Supple. JVP 6-7 cm. Carotids 2+ bilat; no bruits. No thyromegaly or nodule noted. Cor: PMI nondisplaced. Irregularly irregular. Somewhat tachy. ? Soft S3.  Lungs: CTAB, normal effort. Abdomen: Soft, non-tender, non-distended, no HSM. No bruits or masses. +BS  Extremities: No cyanosis, clubbing, or rash. R and LLE no edema.  Neuro: Alert & orientedx3, cranial nerves grossly intact. moves all 4 extremities w/o difficulty. Affect pleasant   ASSESSMENT & PLAN: 1. Chronic Systolic Heart Failure: Echo 10/2015 EF 25-30%. Has [employer] ICD.  - NYHA II-III  symptoms - Volume status looks OK on exam.  - Continue torsemide 80 mg BID.  - Continue metolazone as needed. OK to take one tomorrow to see if that helps his orthopnea. - Continue spiro 25 mg daily.  - Continue digoxin 0.0625 mg daily.  - Continue hydralazine 25 mg TID.  - No beta blocker with acute decompensation although he is not currently on one.  - Reinforced fluid restriction to < 2 L daily, sodium restriction to less than 2000 mg daily, and the importance of daily weights.    2. CAD s/p CABG 2014 - Having CP with atypical and typical features - With significant history and worsening CP, will plan for [facility] next week.  - Creatinine has been stable.   2. Chronic A fib - Rate controlled overall. On Amiodarone 200 mg daily.  - Continue Xarelto for anticoagulation. Will hold for cath. Last dose Sunday 04/20/17, then resume post cath - Denies bleeding. CBC today.   - CXR at last visit with chronic interstitial prominence which has progressed from 2017. - Will plan High Res Chest CT and Sed Rate to look for amiodarone toxicity as he has been on for a prolonged period.   3. H/O GI Bleed - duodenal ulcer with clip 11/2003 - CBC today.    4. PAD- Last ABI 2015  - R EIA stent with known bilateral SFA occlusion.  - Follows with Dr. Allyson Sabal: "He is not a candidate for endovascular therapy of his SFAs and would require femoropopliteal bypass grafting which I think he would be high risk for given his cardiac situation" - Continues to smoke. Encouraged complete cessation. No change.   5. Social  Issues - Resides in low income housing.  - Nurse, adult. No change.    6. Smoking - Encouraged complete cessation.   7. HTN -  Stable on current meds.   8. Hypokalemia - BMET today.   Labs as above. Plan for [facility] 04/22/17 with angina.  Graciella Freer, PA-C  3:08 PM   Patient seen and examined with the above-signed Advanced Practice Provider and/or Housestaff. I personally  reviewed laboratory data, imaging studies and relevant notes. I independently examined the patient and formulated the important aspects of the plan. I have edited the note to reflect any of my changes or salient points. I have personally discussed the plan with the patient and/or family.  He is now having daily angina which is relatively new for him. Volume status milldly elevated but much better than where it is typically. Continues to smoke.   Will need R/L cath. We offered him a spot for tomorrow but he would like to wait until next Tuesday. We will schedule. I have reviewed the risks, indications, and alternatives to angioplasty and stenting with the patient. Risks include but are not limited to bleeding, infection, vascular injury, stroke, myocardial infection, arrhythmia, kidney injury, radiation-related injury in the case of prolonged fluoroscopy use, emergency cardiac surgery, and death. The patient understands the risks of serious complication is low (<1%) and he agrees to proceed.   On exam. Trace - 1+ edema. IRR IRR rhythm with chronic AF. No s3.   Arvilla Meres, MD  6:09 PM

## 2017-04-17 ENCOUNTER — Other Ambulatory Visit (HOSPITAL_COMMUNITY): Payer: Self-pay

## 2017-04-17 ENCOUNTER — Telehealth (HOSPITAL_COMMUNITY): Payer: Self-pay | Admitting: Cardiology

## 2017-04-17 DIAGNOSIS — I5042 Chronic combined systolic (congestive) and diastolic (congestive) heart failure: Secondary | ICD-10-CM

## 2017-04-17 NOTE — Telephone Encounter (Signed)
Patient aware. Patient did not verbalize understanding, patient asked if all medication changes could be sent to [facility] with [facility] aware and voiced understanding. [phone] Will arrange taxi for transportation as medicaid transport requires a weeks notice per social work.

## 2017-04-17 NOTE — Telephone Encounter (Signed)
-----   Message from Graciella Freer, PA-C sent at 04/17/2017  8:18 AM EST ----- Repeat BMET Friday. (Has medicaid so should be able to get transport)   Casimiro Needle "[employer], PA-C 04/17/2017 8:18 AM

## 2017-04-18 ENCOUNTER — Other Ambulatory Visit (HOSPITAL_COMMUNITY): Payer: Self-pay | Admitting: Cardiology

## 2017-04-18 ENCOUNTER — Other Ambulatory Visit (HOSPITAL_COMMUNITY): Payer: Self-pay

## 2017-04-18 ENCOUNTER — Ambulatory Visit (HOSPITAL_COMMUNITY)
Admission: RE | Admit: 2017-04-18 | Discharge: 2017-04-18 | Disposition: A | Discharge status: Home / Self Care | Payer: Medicare Other | Source: Ambulatory Visit | Attending: Cardiology | Admitting: Cardiology

## 2017-04-18 ENCOUNTER — Other Ambulatory Visit (HOSPITAL_COMMUNITY): Payer: Self-pay | Admitting: Internal Medicine

## 2017-04-18 DIAGNOSIS — I5042 Chronic combined systolic (congestive) and diastolic (congestive) heart failure: Secondary | ICD-10-CM | POA: Insufficient documentation

## 2017-04-18 DIAGNOSIS — I255 Ischemic cardiomyopathy: Secondary | ICD-10-CM

## 2017-04-18 DIAGNOSIS — R0600 Dyspnea, unspecified: Secondary | ICD-10-CM

## 2017-04-18 LAB — BASIC METABOLIC PANEL
ANION GAP: 12 (ref 5–15)
BUN: 47 mg/dL — ABNORMAL HIGH (ref 6–20)
CO2: 27 mmol/L (ref 22–32)
Calcium: 9.9 mg/dL (ref 8.9–10.3)
Chloride: 87 mmol/L — ABNORMAL LOW (ref 101–111)
Creatinine, Ser: 1.73 mg/dL — ABNORMAL HIGH (ref 0.61–1.24)
GFR calc Af Amer: 48 mL/min — ABNORMAL LOW (ref >60)
GFR, EST NON AFRICAN AMERICAN: 41 mL/min — AB (ref >60)
GLUCOSE: 408 mg/dL — AB (ref 65–99)
POTASSIUM: 6 mmol/L — AB (ref 3.5–5.1)
SODIUM: 126 mmol/L — AB (ref 135–145)

## 2017-04-18 NOTE — Progress Notes (Signed)
Pt needed medication packs revised, so instead of waiting for the pharmacy I took the amiodarone and potassium out of two med packs.  I showed pt the revision while he was waiting for the taxi to get him for lab work.

## 2017-04-18 NOTE — Addendum Note (Signed)
Addended by: Chyrl Civatte on: 04/18/2017 08:51 AM   Modules accepted: Orders, SmartSet

## 2017-04-18 NOTE — Progress Notes (Signed)
Jacob [name] advised me that pt is to stop potassium and amiodarone.  Due to pt not having dependable transportation I picked up his unused pill packs to take back to the pharmacy for revision.  I also showed pt which pills to not to take in the am.  Pt stated that he understands.

## 2017-04-21 ENCOUNTER — Other Ambulatory Visit (HOSPITAL_COMMUNITY): Payer: Medicare Other

## 2017-04-21 ENCOUNTER — Telehealth: Payer: Self-pay | Admitting: Licensed Clinical Social Worker

## 2017-04-21 ENCOUNTER — Telehealth (HOSPITAL_COMMUNITY): Payer: Self-pay | Admitting: Pharmacist

## 2017-04-21 ENCOUNTER — Ambulatory Visit (HOSPITAL_COMMUNITY): Payer: Medicare Other

## 2017-04-21 ENCOUNTER — Telehealth (HOSPITAL_COMMUNITY): Payer: Self-pay

## 2017-04-21 DIAGNOSIS — I5042 Chronic combined systolic (congestive) and diastolic (congestive) heart failure: Secondary | ICD-10-CM

## 2017-04-21 MED ORDER — SODIUM POLYSTYRENE SULFONATE 15 GM/60ML PO SUSP: 30.0000 g | Freq: Once | ORAL | 0 refills | Status: AC

## 2017-04-21 NOTE — Telephone Encounter (Signed)
CSW contacted patient earlier today to confirm this afternoon's appointment and need for additional lab work prior to tomorrow's planned cath. CSW arranged cab ride today and tomorrow. Patient later called stating he was not going to make it today "my car broke down". Patient states he needs to follow up on broken car and not available for two hours. Patient cancelled today's appointment and CT. Patient will need added medication for tomorrow and CSW contacted paramedic to pick up at pharmacy and drop off to patient's home due to missed appointment today. CSW confirmed with patient pick up for Cath scheduled tomorrow morning. Patient verbalizes understanding and will be here in the morning. CSW continues to follow. Lasandra Beech, LCSW, CCSW-MCS [phone]

## 2017-04-21 NOTE — Telephone Encounter (Signed)
Notes recorded by Chyrl Civatte, RN on 04/21/2017 at 8:34 AM EST Patient aware, [facility] [place] scheduled cab to bring patient to clinic by 3pm, will forward med changes to CHF paramed Dee to confirm changes to pill box ------  Notes recorded by Graciella Freer, PA-C on 04/21/2017 at 7:21 AM EST Stop spiro.  Needs STAT BMET this am and to wait for results. May need kayexalate.    Casimiro Needle [address]" [place], PA-C 04/21/2017 7:19 AM

## 2017-04-21 NOTE — Telephone Encounter (Signed)
Sending SPS to Outpatient Pharmacy per Otilio Saber, PA-C.   Jacob [name]. Jacob [name], PharmD, BCPS, CPP Clinical Pharmacist Phone: [phone] 04/21/2017 3:41 PM

## 2017-04-22 ENCOUNTER — Telehealth: Payer: Self-pay

## 2017-04-22 ENCOUNTER — Other Ambulatory Visit: Payer: Self-pay

## 2017-04-22 ENCOUNTER — Inpatient Hospital Stay (HOSPITAL_COMMUNITY)
Admission: RE | Admit: 2017-04-22 | Discharge: 2017-04-24 | DRG: 286 | Disposition: A | Discharge status: Home Health | Payer: Medicare Other | Source: Ambulatory Visit | Attending: Internal Medicine | Admitting: Internal Medicine

## 2017-04-22 ENCOUNTER — Encounter (HOSPITAL_COMMUNITY): Payer: Self-pay

## 2017-04-22 ENCOUNTER — Inpatient Hospital Stay (HOSPITAL_COMMUNITY): Payer: Medicare Other

## 2017-04-22 DIAGNOSIS — I2510 Atherosclerotic heart disease of native coronary artery without angina pectoris: Secondary | ICD-10-CM | POA: Diagnosis not present

## 2017-04-22 DIAGNOSIS — I70209 Unspecified atherosclerosis of native arteries of extremities, unspecified extremity: Secondary | ICD-10-CM | POA: Diagnosis present

## 2017-04-22 DIAGNOSIS — I481 Persistent atrial fibrillation: Secondary | ICD-10-CM | POA: Diagnosis not present

## 2017-04-22 DIAGNOSIS — E1100 Type 2 diabetes mellitus with hyperosmolarity without nonketotic hyperglycemic-hyperosmolar coma (NKHHC): Secondary | ICD-10-CM

## 2017-04-22 DIAGNOSIS — Z794 Long term (current) use of insulin: Secondary | ICD-10-CM | POA: Diagnosis not present

## 2017-04-22 DIAGNOSIS — Z9119 Patient's noncompliance with other medical treatment and regimen: Secondary | ICD-10-CM | POA: Diagnosis not present

## 2017-04-22 DIAGNOSIS — I252 Old myocardial infarction: Secondary | ICD-10-CM

## 2017-04-22 DIAGNOSIS — Z833 Family history of diabetes mellitus: Secondary | ICD-10-CM | POA: Diagnosis not present

## 2017-04-22 DIAGNOSIS — Z79899 Other long term (current) drug therapy: Secondary | ICD-10-CM | POA: Diagnosis not present

## 2017-04-22 DIAGNOSIS — Z955 Presence of coronary angioplasty implant and graft: Secondary | ICD-10-CM

## 2017-04-22 DIAGNOSIS — Z7901 Long term (current) use of anticoagulants: Secondary | ICD-10-CM

## 2017-04-22 DIAGNOSIS — E875 Hyperkalemia: Secondary | ICD-10-CM | POA: Diagnosis present

## 2017-04-22 DIAGNOSIS — N179 Acute kidney failure, unspecified: Secondary | ICD-10-CM | POA: Diagnosis present

## 2017-04-22 DIAGNOSIS — E1122 Type 2 diabetes mellitus with diabetic chronic kidney disease: Secondary | ICD-10-CM | POA: Diagnosis present

## 2017-04-22 DIAGNOSIS — I482 Chronic atrial fibrillation: Secondary | ICD-10-CM | POA: Diagnosis not present

## 2017-04-22 DIAGNOSIS — E785 Hyperlipidemia, unspecified: Secondary | ICD-10-CM | POA: Diagnosis present

## 2017-04-22 DIAGNOSIS — Z596 Low income: Secondary | ICD-10-CM

## 2017-04-22 DIAGNOSIS — F17210 Nicotine dependence, cigarettes, uncomplicated: Secondary | ICD-10-CM | POA: Diagnosis present

## 2017-04-22 DIAGNOSIS — I5022 Chronic systolic (congestive) heart failure: Secondary | ICD-10-CM | POA: Diagnosis not present

## 2017-04-22 DIAGNOSIS — I5023 Acute on chronic systolic (congestive) heart failure: Secondary | ICD-10-CM | POA: Diagnosis present

## 2017-04-22 DIAGNOSIS — Z91018 Allergy to other foods: Secondary | ICD-10-CM

## 2017-04-22 DIAGNOSIS — Z951 Presence of aortocoronary bypass graft: Secondary | ICD-10-CM

## 2017-04-22 DIAGNOSIS — N183 Chronic kidney disease, stage 3 (moderate): Secondary | ICD-10-CM | POA: Diagnosis present

## 2017-04-22 DIAGNOSIS — R079 Chest pain, unspecified: Secondary | ICD-10-CM | POA: Diagnosis present

## 2017-04-22 DIAGNOSIS — E1165 Type 2 diabetes mellitus with hyperglycemia: Secondary | ICD-10-CM | POA: Diagnosis present

## 2017-04-22 DIAGNOSIS — R0600 Dyspnea, unspecified: Secondary | ICD-10-CM

## 2017-04-22 DIAGNOSIS — E1151 Type 2 diabetes mellitus with diabetic peripheral angiopathy without gangrene: Secondary | ICD-10-CM | POA: Diagnosis present

## 2017-04-22 DIAGNOSIS — I208 Other forms of angina pectoris: Secondary | ICD-10-CM

## 2017-04-22 DIAGNOSIS — I130 Hypertensive heart and chronic kidney disease with heart failure and stage 1 through stage 4 chronic kidney disease, or unspecified chronic kidney disease: Principal | ICD-10-CM | POA: Diagnosis present

## 2017-04-22 DIAGNOSIS — R0602 Shortness of breath: Secondary | ICD-10-CM | POA: Diagnosis not present

## 2017-04-22 DIAGNOSIS — Z95810 Presence of automatic (implantable) cardiac defibrillator: Secondary | ICD-10-CM

## 2017-04-22 DIAGNOSIS — E1159 Type 2 diabetes mellitus with other circulatory complications: Secondary | ICD-10-CM

## 2017-04-22 LAB — CBC
HEMATOCRIT: 40.4 % (ref 39.0–52.0)
HEMOGLOBIN: 13.5 g/dL (ref 13.0–17.0)
MCH: 27.3 pg (ref 26.0–34.0)
MCHC: 33.4 g/dL (ref 30.0–36.0)
MCV: 81.8 fL (ref 78.0–100.0)
Platelets: 236 10*3/uL (ref 150–400)
RBC: 4.94 MIL/uL (ref 4.22–5.81)
RDW: 17.2 % — ABNORMAL HIGH (ref 11.5–15.5)
WBC: 12.7 10*3/uL — AB (ref 4.0–10.5)

## 2017-04-22 LAB — GLUCOSE, CAPILLARY
GLUCOSE-CAPILLARY: 478 mg/dL — AB (ref 65–99)
Glucose-Capillary: 464 mg/dL — ABNORMAL HIGH (ref 65–99)
Glucose-Capillary: 204 mg/dL — ABNORMAL HIGH (ref 65–99)
Glucose-Capillary: 271 mg/dL — ABNORMAL HIGH (ref 65–99)
Glucose-Capillary: 382 mg/dL — ABNORMAL HIGH (ref 65–99)

## 2017-04-22 LAB — BASIC METABOLIC PANEL
ANION GAP: 12 (ref 5–15)
BUN: 40 mg/dL — ABNORMAL HIGH (ref 6–20)
CHLORIDE: 90 mmol/L — AB (ref 101–111)
CO2: 24 mmol/L (ref 22–32)
Calcium: 8 mg/dL — ABNORMAL LOW (ref 8.9–10.3)
Creatinine, Ser: 1.44 mg/dL — ABNORMAL HIGH (ref 0.61–1.24)
GFR calc Af Amer: 60 mL/min — ABNORMAL LOW (ref >60)
GFR, EST NON AFRICAN AMERICAN: 51 mL/min — AB (ref >60)
Glucose, Bld: 469 mg/dL — ABNORMAL HIGH (ref 65–99)
POTASSIUM: 4.3 mmol/L (ref 3.5–5.1)
SODIUM: 126 mmol/L — AB (ref 135–145)

## 2017-04-22 LAB — PROTIME-INR
INR: 1.61
Prothrombin Time: 19 seconds — ABNORMAL HIGH (ref 11.4–15.2)

## 2017-04-22 LAB — MAGNESIUM: Magnesium: 2 mg/dL (ref 1.7–2.4)

## 2017-04-22 MED ORDER — SODIUM CHLORIDE 0.9% FLUSH: 3.0000 mL | INTRAVENOUS | Status: DC | PRN

## 2017-04-22 MED ORDER — INSULIN ASPART 100 UNIT/ML ~~LOC~~ SOLN
SUBCUTANEOUS | Status: AC
  Administered 2017-04-22: 5 [IU] via SUBCUTANEOUS
  Filled 2017-04-22: qty 1

## 2017-04-22 MED ORDER — ISOSORBIDE MONONITRATE ER 30 MG PO TB24
30.0000 mg | ORAL_TABLET | Freq: Every day | ORAL | Status: DC
  Administered 2017-04-22 – 2017-04-24 (×3): 30 mg via ORAL
  Filled 2017-04-22 (×4): qty 1

## 2017-04-22 MED ORDER — ONDANSETRON HCL 4 MG/2ML IJ SOLN: 4.0000 mg | Freq: Four times a day (QID) | INTRAMUSCULAR | Status: DC | PRN

## 2017-04-22 MED ORDER — SODIUM CHLORIDE 0.9% FLUSH
3.0000 mL | Freq: Two times a day (BID) | INTRAVENOUS | Status: DC
  Administered 2017-04-23 (×2): 3 mL via INTRAVENOUS

## 2017-04-22 MED ORDER — ASPIRIN 81 MG PO CHEW
CHEWABLE_TABLET | ORAL | Status: AC
  Filled 2017-04-22: qty 1

## 2017-04-22 MED ORDER — INSULIN ASPART 100 UNIT/ML ~~LOC~~ SOLN
0.0000 [IU] | Freq: Every day | SUBCUTANEOUS | Status: DC
  Administered 2017-04-22: 5 [IU] via SUBCUTANEOUS

## 2017-04-22 MED ORDER — INSULIN ASPART 100 UNIT/ML ~~LOC~~ SOLN
0.0000 [IU] | Freq: Three times a day (TID) | SUBCUTANEOUS | Status: DC
  Administered 2017-04-22: 8 [IU] via SUBCUTANEOUS
  Administered 2017-04-22: 5 [IU] via SUBCUTANEOUS
  Administered 2017-04-23: 8 [IU] via SUBCUTANEOUS
  Administered 2017-04-23: 5 [IU] via SUBCUTANEOUS
  Administered 2017-04-24: 11 [IU] via SUBCUTANEOUS
  Administered 2017-04-24: 5 [IU] via SUBCUTANEOUS

## 2017-04-22 MED ORDER — DICLOFENAC SODIUM 1 % TD GEL
4.0000 g | Freq: Four times a day (QID) | TRANSDERMAL | Status: DC | PRN
  Administered 2017-04-22: 20:00:00 4 g via TOPICAL
  Filled 2017-04-22: qty 100

## 2017-04-22 MED ORDER — SODIUM CHLORIDE 0.9 % IV SOLN
INTRAVENOUS | Status: DC
  Administered 2017-04-23: 06:00:00 via INTRAVENOUS

## 2017-04-22 MED ORDER — HYDRALAZINE HCL 25 MG PO TABS
25.0000 mg | ORAL_TABLET | Freq: Three times a day (TID) | ORAL | Status: DC
  Administered 2017-04-22 – 2017-04-23 (×5): 25 mg via ORAL
  Filled 2017-04-22 (×8): qty 1

## 2017-04-22 MED ORDER — ASPIRIN 81 MG PO CHEW: 81.0000 mg | CHEWABLE_TABLET | ORAL | Status: DC

## 2017-04-22 MED ORDER — GABAPENTIN 300 MG PO CAPS
300.0000 mg | ORAL_CAPSULE | Freq: Every day | ORAL | Status: DC
  Administered 2017-04-22 – 2017-04-24 (×3): 300 mg via ORAL
  Filled 2017-04-22 (×3): qty 1

## 2017-04-22 MED ORDER — ATORVASTATIN CALCIUM 40 MG PO TABS
40.0000 mg | ORAL_TABLET | Freq: Every day | ORAL | Status: DC
  Administered 2017-04-22 – 2017-04-23 (×2): 40 mg via ORAL
  Filled 2017-04-22 (×2): qty 1

## 2017-04-22 MED ORDER — INSULIN ASPART 100 UNIT/ML ~~LOC~~ SOLN
10.0000 [IU] | Freq: Once | SUBCUTANEOUS | Status: AC
  Administered 2017-04-22: 10 [IU] via SUBCUTANEOUS
  Filled 2017-04-22: qty 0.1

## 2017-04-22 MED ORDER — SODIUM CHLORIDE 0.9% FLUSH: 3.0000 mL | Freq: Two times a day (BID) | INTRAVENOUS | Status: DC

## 2017-04-22 MED ORDER — ALBUTEROL SULFATE HFA 108 (90 BASE) MCG/ACT IN AERS: 2.0000 | INHALATION_SPRAY | Freq: Four times a day (QID) | RESPIRATORY_TRACT | Status: DC | PRN

## 2017-04-22 MED ORDER — SODIUM CHLORIDE 0.9 % IV BOLUS (SEPSIS): 250.0000 mL | Freq: Once | INTRAVENOUS | Status: DC

## 2017-04-22 MED ORDER — FUROSEMIDE 10 MG/ML IJ SOLN
80.0000 mg | Freq: Two times a day (BID) | INTRAMUSCULAR | Status: DC
  Administered 2017-04-22: 80 mg via INTRAVENOUS
  Filled 2017-04-22 (×2): qty 8

## 2017-04-22 MED ORDER — GLIPIZIDE 10 MG PO TABS
10.0000 mg | ORAL_TABLET | Freq: Two times a day (BID) | ORAL | Status: DC
  Administered 2017-04-22 – 2017-04-24 (×4): 10 mg via ORAL
  Filled 2017-04-22 (×4): qty 1

## 2017-04-22 MED ORDER — INSULIN GLARGINE 100 UNIT/ML ~~LOC~~ SOLN
20.0000 [IU] | Freq: Every day | SUBCUTANEOUS | Status: DC
  Administered 2017-04-22: 20 [IU] via SUBCUTANEOUS
  Filled 2017-04-22 (×2): qty 0.2

## 2017-04-22 MED ORDER — SODIUM CHLORIDE 0.9 % IV SOLN: 250.0000 mL | INTRAVENOUS | Status: DC | PRN

## 2017-04-22 MED ORDER — INSULIN ASPART 100 UNIT/ML ~~LOC~~ SOLN
SUBCUTANEOUS | Status: AC
  Administered 2017-04-22: 10 [IU] via SUBCUTANEOUS
  Filled 2017-04-22: qty 1

## 2017-04-22 MED ORDER — ALBUTEROL SULFATE (2.5 MG/3ML) 0.083% IN NEBU: 2.5000 mg | INHALATION_SOLUTION | Freq: Four times a day (QID) | RESPIRATORY_TRACT | Status: DC | PRN

## 2017-04-22 MED ORDER — ALLOPURINOL 100 MG PO TABS
200.0000 mg | ORAL_TABLET | Freq: Every day | ORAL | Status: DC
  Administered 2017-04-22 – 2017-04-24 (×3): 200 mg via ORAL
  Filled 2017-04-22 (×4): qty 2

## 2017-04-22 MED ORDER — SODIUM CHLORIDE 0.9 % IV SOLN: INTRAVENOUS | Status: DC

## 2017-04-22 MED ORDER — INSULIN ASPART 100 UNIT/ML ~~LOC~~ SOLN
SUBCUTANEOUS | Status: AC
  Administered 2017-04-22: 15 [IU] via SUBCUTANEOUS
  Filled 2017-04-22: qty 1

## 2017-04-22 MED ORDER — DIGOXIN 125 MCG PO TABS
0.0625 mg | ORAL_TABLET | Freq: Every day | ORAL | Status: DC
  Administered 2017-04-22 – 2017-04-24 (×3): 0.0625 mg via ORAL
  Filled 2017-04-22 (×3): qty 1

## 2017-04-22 MED ORDER — ASPIRIN 81 MG PO CHEW
81.0000 mg | CHEWABLE_TABLET | ORAL | Status: AC
  Administered 2017-04-23: 81 mg via ORAL
  Filled 2017-04-22: qty 1

## 2017-04-22 MED ORDER — INSULIN ASPART 100 UNIT/ML ~~LOC~~ SOLN
4.0000 [IU] | Freq: Three times a day (TID) | SUBCUTANEOUS | Status: DC
  Administered 2017-04-22 – 2017-04-24 (×5): 4 [IU] via SUBCUTANEOUS

## 2017-04-22 MED ORDER — INSULIN ASPART 100 UNIT/ML ~~LOC~~ SOLN
15.0000 [IU] | Freq: Once | SUBCUTANEOUS | Status: AC
  Administered 2017-04-22: 15 [IU] via SUBCUTANEOUS

## 2017-04-22 MED ORDER — ACETAMINOPHEN 325 MG PO TABS: 650.0000 mg | ORAL_TABLET | ORAL | Status: DC | PRN

## 2017-04-22 NOTE — Telephone Encounter (Signed)
As per Lasandra Beech, LCSW the patient has concerns about his disability payment. He is receiving about $540/month and most of that is used to pay his rent. A referral for assistance from [employer]  was completed and faxed to Ranee Gosselin - [employer] [place].

## 2017-04-22 NOTE — H&P (Signed)
Advanced Heart Failure Team History and Physical Note  Primary Cardiologist:  Dr. Gala Romney   Reason for Admission: Chest pain  HPI:    Jacob [name] is a 62 y.o. male [place] male with history of systolic HF EF 20-25% via Echo 08/03/14 s/p INCEPTA ICD implant 6/14, RV dysfunction, CAD s/p CABG x 2 w/ RF MAZE 7/14 at [facility], DM type II, Chronic afib on xarelto, GI bleed 11/28/2014, and HLD.   Pt seen in clinic 04/16/17. Has since been non-compliant with follow up. He has continued to have chest pain. He does have GERD symptoms related to food in the evening, but he also has a different chest pain/pressure caused by exertion. This happens even walking around the house. It is relieved by rest.  He continues to smoke 1/2 ppd. He c/o of orthopnea and ? PND. He states he is taking all of his medication as directed.   Labs that day with elevated K. This was stopped, but repeat K 6.0. Pt called to come in for labs but patient did not get in cab due to issues with his home.  He presented for [facility] this am, but complained of dizziness and blood sugar elevated > 400.  On history, pt also admitted to taking his Xarelto this am (Despite instructions for his last dose to be on Sunday).   He denies chest pain currently, and has actually felt better over the weekend.   K 4.3 this am (Off spiro and K)  Review of Systems: [y] = yes, [ ]  = no   General: Weight gain [y]; Weight loss [ ] ; Anorexia [ ] ; Fatigue [y]; Fever [ ] ; Chills [ ] ; Weakness [ ]   Cardiac: Chest pain/pressure [y]; Resting SOB [ ] ; Exertional SOB [y]; Orthopnea [ ] ; Pedal Edema [y]; Palpitations [ ] ; Syncope [ ] ; Presyncope [ ] ; Paroxysmal nocturnal dyspnea[ ]   Pulmonary: Cough [ ] ; Wheezing[ ] ; Hemoptysis[ ] ; Sputum [ ] ; Snoring [ ]   GI: Vomiting[ ] ; Dysphagia[ ] ; Melena[ ] ; Hematochezia [ ] ; Heartburn[ ] ; Abdominal pain [ ] ; Constipation [ ] ; Diarrhea [ ] ; BRBPR [ ]   GU: Hematuria[ ] ; Dysuria [ ] ; Nocturia[ ]   Vascular: Pain in legs with  walking [ ] ; Pain in feet with lying flat [ ] ; Non-healing sores [ ] ; Stroke [ ] ; TIA [ ] ; Slurred speech [ ] ;  Neuro: Headaches[ ] ; Vertigo[ ] ; Seizures[ ] ; Paresthesias[ ] ;Blurred vision [ ] ; Diplopia [ ] ; Vision changes [ ]   Ortho/Skin: Arthritis [y]; Joint pain [y]; Muscle pain [ ] ; Joint swelling [ ] ; Back Pain [ ] ; Rash [ ]   Psych: Depression[ ] ; Anxiety[ ]   Heme: Bleeding problems [ ] ; Clotting disorders [ ] ; Anemia [ ]   Endocrine: Diabetes [ ] ; Thyroid dysfunction[ ]    Home Medications Prior to Admission medications   Medication Sig Start Date End Date Taking? Authorizing Provider  ACCU-CHEK SOFTCLIX LANCETS lancets Use for once daily testing of blood sugar 11/28/15  Yes Newlin, Enobong, MD  albuterol (PROVENTIL HFA;VENTOLIN HFA) 108 (90 Base) MCG/ACT inhaler Inhale 2 puffs into the lungs every 6 (six) hours as needed for wheezing or shortness of breath. 03/29/15  Yes Hoy Register, MD  allopurinol (ZYLOPRIM) 100 MG tablet Take 2 tablets (200 mg total) by mouth daily. 01/21/17  Yes Yuri Flener, Bevelyn Buckles, MD  atorvastatin (LIPITOR) 40 MG tablet Take 1 tablet (40 mg total) by mouth daily at 6 PM. 11/11/16  Yes Hoy Register, MD  Blood Glucose Monitoring Suppl (ACCU-CHEK AVIVA) device Use as  instructed1 times daily before meals 11/28/15  Yes Newlin, Enobong, MD  diclofenac sodium (VOLTAREN) 1 % GEL Apply 4 g topically 4 (four) times daily. 100/16=7 Patient taking differently: Apply 4 g topically 4 (four) times daily. AS NEEDED FOR PAIN 02/19/17  Yes Hoy Register, MD  digoxin (LANOXIN) 0.125 MG tablet Take 0.5 tablets (0.0625 mg total) by mouth daily. 04/04/17  Yes Shalin Vonbargen, Bevelyn Buckles, MD  gabapentin (NEURONTIN) 300 MG capsule Take 1 capsule (300 mg total) by mouth 2 (two) times daily. Patient taking differently: Take 300 mg by mouth daily.  11/11/16  Yes Hoy Register, MD  glipiZIDE (GLUCOTROL) 10 MG tablet Take 1 tablet (10 mg total) by mouth 2 (two) times daily before a meal. 02/11/17  Yes  Newlin, Enobong, MD  glucose blood (ACCU-CHEK AVIVA) test strip Use as instructed for 1 times daily testing of blood sugar 11/28/15  Yes Newlin, Enobong, MD  hydrALAZINE (APRESOLINE) 25 MG tablet take 1 TABLET BY MOUTH THREE TIMES DAILY every morning, noon,evening Patient taking differently: Take 25 mg by mouth daily.  12/30/16  Yes Laurey Morale, MD  hydrALAZINE (APRESOLINE) 25 MG tablet take 1 TABLET BY MOUTH THREE TIMES DAILY every morning, noon,evening 04/18/17  Yes Muhamad Serano, Bevelyn Buckles, MD  Insulin Glargine (LANTUS) 100 UNIT/ML Solostar Pen Inject 30 Units into the skin daily at 10 pm. 02/12/17  Yes Hoy Register, MD  Insulin Pen Needle 31G X 5 MM MISC Use as directed for once daily insulin injection 11/11/16  Yes Newlin, Odette Horns, MD  isosorbide mononitrate (IMDUR) 30 MG 24 hr tablet Take 1 tablet (30 mg total) by mouth daily. 04/04/17  Yes Bennett Ram, Bevelyn Buckles, MD  Lancet Devices [facility]) lancets Use as instructed for once daily testing of blood sugar 11/28/15  Yes Hoy Register, MD  rivaroxaban (XARELTO) 20 MG TABS tablet take one 20mg  tablet daily by mouth Patient taking differently: Take 20 mg by mouth daily with supper.  01/28/17  Yes Genea Rheaume, Bevelyn Buckles, MD  torsemide (DEMADEX) 20 MG tablet Take 4 tablets (80 mg total) by mouth 2 (two) times daily. 03/26/17  Yes Rayshon Albaugh, Bevelyn Buckles, MD  albuterol (PROVENTIL) (2.5 MG/3ML) 0.083% nebulizer solution Take 3 mLs (2.5 mg total) by nebulization every 6 (six) hours as needed for wheezing or shortness of breath. 04/18/15   Hoy Register, MD  cetirizine (ZYRTEC) 10 MG tablet Take 1 tablet (10 mg total) by mouth daily. Patient not taking: Reported on 04/17/2017 11/11/16   Hoy Register, MD  metolazone (ZAROXOLYN) 2.5 MG tablet Take 1 tablet once as directed by CHF clinic. Patient not taking: Reported on 04/17/2017 11/13/16   Valor Turberville, Bevelyn Buckles, MD    Past Medical History: Past Medical History:  Diagnosis Date  . Atrial fibrillation (HCC)     RVR 10/2014  . Automatic implantable cardioverter-defibrillator in situ   . CAD (coronary artery disease) Sept 2013   s/p cardiac cath showing occlusion of small RCA with collaterals  . CHF (congestive heart failure) (HCC)    20 to 25 % EF and RV dysfunction by 07/2014 echo   . Chronic anticoagulation    on xarelto.   . High cholesterol   . Hypertension   . Myocardial infarction (HCC) 2014  . Noncompliance    homelessness contributing.   . Peripheral arterial disease (HCC)   . Type II diabetes mellitus (HCC)     Past Surgical History: Past Surgical History:  Procedure Laterality Date  . CARDIAC CATHETERIZATION  09/2012  . CORONARY ANGIOPLASTY  WITH STENT PLACEMENT  11/2011   "1"  . CORONARY ARTERY BYPASS GRAFT  09/2012   2 vessels per patient [name]   . ESOPHAGOGASTRODUODENOSCOPY N/A 11/28/2014   Procedure: ESOPHAGOGASTRODUODENOSCOPY (EGD);  Surgeon: Beverley Fiedler, MD;  Location: [facility] ENDOSCOPY;  Service: Endoscopy;  Laterality: N/A;  . ILIAC ARTERY STENT Right 08/30/2013  . IMPLANTABLE CARDIOVERTER DEFIBRILLATOR IMPLANT     [place] in 07/2012; [employer]  . LEFT AND RIGHT HEART CATHETERIZATION WITH CORONARY ANGIOGRAM N/A 12/02/2011   Procedure: LEFT AND RIGHT HEART CATHETERIZATION WITH CORONARY ANGIOGRAM;  Surgeon: Kathleene Hazel, MD;  Location: [facility] CATH LAB;  Service: Cardiovascular;  Laterality: N/A;  . LOWER EXTREMITY ANGIOGRAM N/A 08/30/2013   Procedure: LOWER EXTREMITY ANGIOGRAM;  Surgeon: Runell Gess, MD;  Location: [facility] CATH LAB;  Service: Cardiovascular;  Laterality: N/A;  . LOWER EXTREMITY ANGIOGRAM N/A 12/02/2013   Procedure: LOWER EXTREMITY ANGIOGRAM;  Surgeon: Runell Gess, MD;  Location: [facility] CATH LAB;  Service: Cardiovascular;  Laterality: N/A;    Family History:  Family History  Problem Relation Age of Onset  . Diabetes Mother     Social History: Social History   Socioeconomic History  . Marital status: Divorced    Spouse name: Not on file  .  Number of children: 3  . Years of education: Not on file  . Highest education level: Not on file  Social Needs  . Financial resource strain: Not on file  . Food insecurity - worry: Not on file  . Food insecurity - inability: Not on file  . Transportation needs - medical: Not on file  . Transportation needs - non-medical: Not on file  Occupational History  . Occupation: disabled  Tobacco Use  . Smoking status: Current Every Day Smoker    Packs/day: 0.50    Years: 40.00    Pack years: 20.00    Types: Cigarettes  . Smokeless tobacco: Never Used  Substance and Sexual Activity  . Alcohol use: No    Alcohol/week: 0.0 oz  . Drug use: No  . Sexual activity: No  Other Topics Concern  . Not on file  Social History Narrative   Has an apartment with a roommate. He was living on the streets in 2013-01-03.  He reports that his father died in [place] in January 03, 2013.  He is divorced.  He is no longer estranged from his son, but still from his daughter who lives locally.  Neither of his parents, nor any siblings have any history of CAD.    Allergies:  Allergies  Allergen Reactions  . Pork-Derived Products Other (See Comments)    Religious preference    Objective:    Vital Signs:   Temp:  [98.7 F (37.1 C)] 98.7 F (37.1 C) (01/29 0733) Pulse Rate:  [96] 96 (01/29 0733) Resp:  [18] 18 (01/29 0808) BP: (140)/(63) 140/63 (01/29 0733) SpO2:  [97 %] 97 % (01/29 0733) Weight:  [242 lb (109.8 kg)] 242 lb (109.8 kg) (01/29 0733)   Filed Weights   04/22/17 0733  Weight: 242 lb (109.8 kg)     Physical Exam     General:  Fatigued. NAD.   HEENT: Normal Neck: Supple. JVD 8-9 cm at least.. Carotids 2+ bilat; no bruits. No lymphadenopathy or thyromegaly appreciated. Cor: PMI nondisplaced. Irregularly, irregular. No rubs, gallops or murmurs. Lungs: Clear Abdomen: Soft, nontender, nondistended. No hepatosplenomegaly. No bruits or masses. Good bowel sounds. Extremities: No cyanosis,  clubbing, or rash. Trace ankle edema. Neuro: Alert &  oriented x 3, cranial nerves grossly intact. moves all 4 extremities w/o difficulty. Affect pleasant.   Telemetry   Not yet connected  EKG   Pending  Labs     Basic Metabolic Panel: Recent Labs  Lab 04/16/17 1532 04/18/17 1230  NA 127* 126*  K 5.7* 6.0*  CL 91* 87*  CO2 24 27  GLUCOSE 439* 408*  BUN 39* 47*  CREATININE 1.57* 1.73*  CALCIUM 9.5 9.9    Liver Function Tests: Recent Labs  Lab 04/16/17 1532  AST 28  ALT 23  ALKPHOS 172*  BILITOT 0.6  PROT 8.2*  ALBUMIN 3.4*   No results for input(s): LIPASE, AMYLASE in the last 168 hours. No results for input(s): AMMONIA in the last 168 hours.  CBC: Recent Labs  Lab 04/16/17 1532  WBC 13.1*  HGB 14.1  HCT 41.8  MCV 83.1  PLT 287    Cardiac Enzymes: No results for input(s): CKTOTAL, CKMB, CKMBINDEX, TROPONINI in the last 168 hours.  BNP: BNP (last 3 results) Recent Labs    12/17/16 1430 03/02/17 2319 03/06/17 1050  BNP 394.2* 381.6* 276.3*    ProBNP (last 3 results) No results for input(s): PROBNP in the last 8760 hours.   CBG: Recent Labs  Lab 04/22/17 0732  GLUCAP 478*    Coagulation Studies: No results for input(s): LABPROT, INR in the last 72 hours.  Imaging:  No results found.   Patient Profile   Judah Carchi is a 62 y.o. male [place] male with history of systolic HF EF 20-25% via Echo 08/03/14 s/p INCEPTA ICD implant 6/14, RV dysfunction, CAD s/p CABG x 2 w/ RF MAZE 7/14 at [facility], DM type II, Chronic afib on xarelto, GI bleed 11/28/2014, and HLD.   Admitted from short stay prior to planned cath with dizziness, hypoglycemia, and having taken his blood thinner despite instructions to hold.   Assessment/Plan   1. CAD s/p CABG 2014 with progressive exertional CP - Was planned for cath today but multiple problems and took Xarelto. Plan for first thing tomorrow am.  - Creatinine has been stable.   2. Chronic Systolic Heart  Failure: Echo 03/6107 EF 25-30%. Has [employer] ICD.  - NYHA II-III symptoms - Volume status at least mildly elevated - Will give IV lasix 80 mg BID while in house.  - Continue spiro 25 mg daily.  - Continue digoxin 0.0625 mg daily.  - Continue hydralazine 25 mg TID.  - No beta blocker with acute decompensation although he is not currently on one.  - Reinforced fluid restriction to < 2 L daily, sodium restriction to less than 2000 mg daily, and the importance of daily weights.    3. Chronic A fib - Rate controlled overall. On Amiodarone 200 mg daily.  - On Xarelto for anticoagulation. Took this am accidentally. Hold for cath tomorrow.  - Denies bleeding. CBC stable - CXR at last visit with chronic interstitial prominence which has progressed from 2017. - Will plan High Res Chest CT today. Sed Rate elevated concerning for amiodarone toxicity    4. H/O GI Bleed - duodenal ulcer with clip 11/2003 - Hgb stable.    5. PAD- Last ABI 2015  - R EIA stent with known bilateral SFA occlusion.  - Follows with Dr. Allyson Sabal: "He is not a candidate for endovascular therapy of his SFAs and would require femoropopliteal bypass grafting which I think he would be high risk for given his cardiac situation" - Continues to smoke. Encouraged complete  cessation. No change.   6. Social  Issues - Resides in low income housing.  - Nurse, adult. No change.   7. Smoking - Encouraged complete cessation. No change.   8. HTN - Controlled. Meds as above.   9 Hyperkalemia - Improved. Likely in setting of AKI - Holding K and spiro.   10. DM2 - Poorly controlled with CBGs> 400 - Will cover with SSI. Will check Hgba1c.  - DM2 coordinator consult  Admit for cath and diuresis.  Graciella Freer, PA-C 04/22/2017, 8:19 AM  Advanced Heart Failure Team Pager [phone] (M-F; 7a - 4p)  Please contact [facility] for night-coverage after hours (4p -7a ) and weekends on  [email or web address]  Patient seen and examined with the above-signed Advanced Practice Provider and/or Housestaff. I personally reviewed laboratory data, imaging studies and relevant notes. I independently examined the patient and formulated the important aspects of the plan. I have edited the note to reflect any of my changes or salient points. I have personally discussed the plan with the patient and/or family.  He has had progressive exertional CP. Outpatient cath planned for today but presented to cath lad with CBG> 400 with dizziness as well as recent hyperkalemia. Also took Xarelto his am.   Will admit for pre-cath tune-up. Hold Xarelto. Will also get CT chest to assess for possible amio lung toxicity. Amio now on hold (was on for rate control)  Follow electrolytes.   Cover DM with SSI. DM coordinator consult. Check HgBa1c.   Arvilla Meres, MD  12:29 PM

## 2017-04-22 NOTE — Progress Notes (Signed)
Report called to RN for 6-E-30 and transferred via w/c

## 2017-04-22 NOTE — Progress Notes (Signed)
CSW met with patient in short stay to complete referral paperwork for Legal [employer] assistance. CSW contacted jane at [employer] and wellness to inform and she will pick up and forward to appropriate referral. Patient appears to be in good spirits and states he will be admitted and have cardiac cath tomorrow morning. CSW will continue to coordinate needs with [employer] as needed. Raquel Sarna, [place], [place]

## 2017-04-23 ENCOUNTER — Encounter (HOSPITAL_COMMUNITY)
Admission: RE | Disposition: A | Discharge status: Home Health | Payer: Self-pay | Source: Ambulatory Visit | Attending: Internal Medicine

## 2017-04-23 ENCOUNTER — Encounter (HOSPITAL_COMMUNITY): Payer: Self-pay | Admitting: Internal Medicine

## 2017-04-23 DIAGNOSIS — I482 Chronic atrial fibrillation: Secondary | ICD-10-CM

## 2017-04-23 DIAGNOSIS — I5022 Chronic systolic (congestive) heart failure: Secondary | ICD-10-CM

## 2017-04-23 DIAGNOSIS — I2089 Other forms of angina pectoris: Secondary | ICD-10-CM

## 2017-04-23 DIAGNOSIS — I208 Other forms of angina pectoris: Secondary | ICD-10-CM

## 2017-04-23 HISTORY — PX: RIGHT/LEFT HEART CATH AND CORONARY/GRAFT ANGIOGRAPHY: CATH118267

## 2017-04-23 LAB — HIV ANTIBODY (ROUTINE TESTING W REFLEX): HIV Screen 4th Generation wRfx: NONREACTIVE

## 2017-04-23 LAB — BASIC METABOLIC PANEL
ANION GAP: 11 (ref 5–15)
BUN: 44 mg/dL — ABNORMAL HIGH (ref 6–20)
CALCIUM: 7.6 mg/dL — AB (ref 8.9–10.3)
CO2: 26 mmol/L (ref 22–32)
Chloride: 91 mmol/L — ABNORMAL LOW (ref 101–111)
Creatinine, Ser: 1.58 mg/dL — ABNORMAL HIGH (ref 0.61–1.24)
GFR, EST AFRICAN AMERICAN: 53 mL/min — AB (ref >60)
GFR, EST NON AFRICAN AMERICAN: 46 mL/min — AB (ref >60)
Glucose, Bld: 260 mg/dL — ABNORMAL HIGH (ref 65–99)
POTASSIUM: 4.1 mmol/L (ref 3.5–5.1)
Sodium: 128 mmol/L — ABNORMAL LOW (ref 135–145)

## 2017-04-23 LAB — GLUCOSE, CAPILLARY
GLUCOSE-CAPILLARY: 298 mg/dL — AB (ref 65–99)
GLUCOSE-CAPILLARY: 419 mg/dL — AB (ref 65–99)
Glucose-Capillary: 211 mg/dL — ABNORMAL HIGH (ref 65–99)
Glucose-Capillary: 190 mg/dL — ABNORMAL HIGH (ref 65–99)

## 2017-04-23 SURGERY — RIGHT/LEFT HEART CATH AND CORONARY/GRAFT ANGIOGRAPHY
Anesthesia: LOCAL

## 2017-04-23 MED ORDER — ONDANSETRON HCL 4 MG/2ML IJ SOLN: 4.0000 mg | Freq: Four times a day (QID) | INTRAMUSCULAR | Status: DC | PRN

## 2017-04-23 MED ORDER — IOPAMIDOL (ISOVUE-370) INJECTION 76%
INTRAVENOUS | Status: AC
  Filled 2017-04-23: qty 125

## 2017-04-23 MED ORDER — LIDOCAINE HCL (PF) 1 % IJ SOLN
INTRAMUSCULAR | Status: AC
  Filled 2017-04-23: qty 30

## 2017-04-23 MED ORDER — VERAPAMIL HCL 2.5 MG/ML IV SOLN
INTRAVENOUS | Status: DC | PRN
  Administered 2017-04-23: 10 mL via INTRA_ARTERIAL

## 2017-04-23 MED ORDER — MIDAZOLAM HCL 2 MG/2ML IJ SOLN
INTRAMUSCULAR | Status: DC | PRN
  Administered 2017-04-23: 1 mg via INTRAVENOUS

## 2017-04-23 MED ORDER — HEPARIN (PORCINE) IN NACL 2-0.9 UNIT/ML-% IJ SOLN
INTRAMUSCULAR | Status: AC
  Filled 2017-04-23: qty 1000

## 2017-04-23 MED ORDER — ACETAMINOPHEN 325 MG PO TABS: 650.0000 mg | ORAL_TABLET | ORAL | Status: DC | PRN

## 2017-04-23 MED ORDER — SODIUM CHLORIDE 0.9 % IV SOLN: INTRAVENOUS | Status: AC

## 2017-04-23 MED ORDER — RIVAROXABAN 20 MG PO TABS
20.0000 mg | ORAL_TABLET | Freq: Every day | ORAL | Status: DC
  Filled 2017-04-23: qty 1

## 2017-04-23 MED ORDER — SODIUM CHLORIDE 0.9% FLUSH: 3.0000 mL | INTRAVENOUS | Status: DC | PRN

## 2017-04-23 MED ORDER — FENTANYL CITRATE (PF) 100 MCG/2ML IJ SOLN
INTRAMUSCULAR | Status: DC | PRN
  Administered 2017-04-23: 25 ug via INTRAVENOUS

## 2017-04-23 MED ORDER — SODIUM CHLORIDE 0.9 % IV SOLN: 250.0000 mL | INTRAVENOUS | Status: DC | PRN

## 2017-04-23 MED ORDER — FENTANYL CITRATE (PF) 100 MCG/2ML IJ SOLN
INTRAMUSCULAR | Status: AC
  Filled 2017-04-23: qty 2

## 2017-04-23 MED ORDER — HEPARIN (PORCINE) IN NACL 2-0.9 UNIT/ML-% IJ SOLN
INTRAMUSCULAR | Status: AC | PRN
  Administered 2017-04-23: 1000 mL

## 2017-04-23 MED ORDER — HEPARIN SODIUM (PORCINE) 1000 UNIT/ML IJ SOLN
INTRAMUSCULAR | Status: AC
  Filled 2017-04-23: qty 1

## 2017-04-23 MED ORDER — LIDOCAINE HCL (PF) 1 % IJ SOLN
INTRAMUSCULAR | Status: DC | PRN
  Administered 2017-04-23 (×2): 2 mL

## 2017-04-23 MED ORDER — VERAPAMIL HCL 2.5 MG/ML IV SOLN
INTRAVENOUS | Status: AC
  Filled 2017-04-23: qty 2

## 2017-04-23 MED ORDER — RIVAROXABAN 20 MG PO TABS
20.0000 mg | ORAL_TABLET | Freq: Every day | ORAL | Status: DC
  Administered 2017-04-23: 20 mg via ORAL

## 2017-04-23 MED ORDER — SPIRONOLACTONE 25 MG PO TABS
25.0000 mg | ORAL_TABLET | Freq: Every day | ORAL | Status: DC
  Administered 2017-04-23 – 2017-04-24 (×2): 25 mg via ORAL
  Filled 2017-04-23 (×2): qty 1

## 2017-04-23 MED ORDER — IOPAMIDOL (ISOVUE-370) INJECTION 76%
INTRAVENOUS | Status: DC | PRN
  Administered 2017-04-23: 45 mL via INTRA_ARTERIAL

## 2017-04-23 MED ORDER — SODIUM CHLORIDE 0.9% FLUSH
3.0000 mL | Freq: Two times a day (BID) | INTRAVENOUS | Status: DC
  Administered 2017-04-23 – 2017-04-24 (×2): 3 mL via INTRAVENOUS

## 2017-04-23 MED ORDER — MIDAZOLAM HCL 2 MG/2ML IJ SOLN
INTRAMUSCULAR | Status: AC
  Filled 2017-04-23: qty 2

## 2017-04-23 MED ORDER — INSULIN ASPART 100 UNIT/ML ~~LOC~~ SOLN
20.0000 [IU] | Freq: Once | SUBCUTANEOUS | Status: AC
  Administered 2017-04-23: 20 [IU] via SUBCUTANEOUS

## 2017-04-23 MED ORDER — HEPARIN SODIUM (PORCINE) 1000 UNIT/ML IJ SOLN
INTRAMUSCULAR | Status: DC | PRN
  Administered 2017-04-23: 5000 [IU] via INTRAVENOUS

## 2017-04-23 MED ORDER — INSULIN GLARGINE 100 UNIT/ML ~~LOC~~ SOLN
30.0000 [IU] | Freq: Every day | SUBCUTANEOUS | Status: DC
  Administered 2017-04-23: 30 [IU] via SUBCUTANEOUS
  Filled 2017-04-23: qty 0.3

## 2017-04-23 SURGICAL SUPPLY — 20 items
CATH BALLN WEDGE 5F 110CM (CATHETERS) ×1 IMPLANT
CATH INFINITI 5 FR JL3.5 (CATHETERS) ×1 IMPLANT
CATH INFINITI 5FR MULTPACK ANG (CATHETERS) ×1 IMPLANT
CATH SWAN GANZ 7F STRAIGHT (CATHETERS) IMPLANT
COVER PRB 48X5XTLSCP FOLD TPE (BAG) IMPLANT
COVER PROBE 5X48 (BAG) ×2
DEVICE RAD COMP TR BAND LRG (VASCULAR PRODUCTS) ×1 IMPLANT
GLIDESHEATH SLEND A-KIT 6F 20G (SHEATH) IMPLANT
GLIDESHEATH SLEND SS 6F .021 (SHEATH) ×1 IMPLANT
GUIDEWIRE INQWIRE 1.5J.035X260 (WIRE) IMPLANT
INQWIRE 1.5J .035X260CM (WIRE) ×4
KIT HEART LEFT (KITS) ×2 IMPLANT
KIT MICROINTRODUCER STIFF 5F (SHEATH) ×1 IMPLANT
PACK CARDIAC CATHETERIZATION (CUSTOM PROCEDURE TRAY) ×2 IMPLANT
SHEATH GLIDE SLENDER 4/5FR (SHEATH) ×1 IMPLANT
SHEATH PINNACLE 5F 10CM (SHEATH) IMPLANT
SHEATH PINNACLE 7F 10CM (SHEATH) IMPLANT
TRANSDUCER W/STOPCOCK (MISCELLANEOUS) ×2 IMPLANT
TUBING CIL FLEX 10 FLL-RA (TUBING) ×2 IMPLANT
WIRE EMERALD 3MM-J .035X150CM (WIRE) ×1 IMPLANT

## 2017-04-23 NOTE — Interval H&P Note (Signed)
History and Physical Interval Note:  04/23/2017 8:43 AM  Jacob [name]  has presented today for surgery, with the diagnosis of hf  The various methods of treatment have been discussed with the patient and family. After consideration of risks, benefits and other options for treatment, the patient has consented to  Procedure(s): RIGHT/LEFT HEART CATH AND CORONARY/GRAFT ANGIOGRAPHY (N/A) possible coronary angioplasty as a surgical intervention .  The patient's history has been reviewed, patient examined, no change in status, stable for surgery.  I have reviewed the patient's chart and labs.  Questions were answered to the patient's satisfaction.     Leila Schuff

## 2017-04-23 NOTE — Progress Notes (Signed)
Advanced Heart Failure Rounding Note   Subjective:     No further CP. Diuresed well with IV lasix Weight down 1 pound. CBGs improved.    Objective:   Weight Range:  Vital Signs:   Temp:  [97.3 F (36.3 C)-98.3 F (36.8 C)] 98.2 F (36.8 C) (01/30 0404) Pulse Rate:  [55-101] 94 (01/30 0404) Resp:  [18-20] 18 (01/30 0404) BP: (108-128)/(59-85) 113/80 (01/30 0404) SpO2:  [94 %-100 %] 99 % (01/30 0835) Weight:  [109.7 kg (241 lb 12.8 oz)-109.9 kg (242 lb 3.2 oz)] 109.7 kg (241 lb 12.8 oz) (01/30 0404)    Weight change: Filed Weights   04/22/17 0733 04/22/17 1653 04/23/17 0404  Weight: 109.8 kg (242 lb) 109.9 kg (242 lb 3.2 oz) 109.7 kg (241 lb 12.8 oz)    Intake/Output:   Intake/Output Summary (Last 24 hours) at 04/23/2017 0950 Last data filed at 04/23/2017 0815 Gross per 24 hour  Intake 240 ml  Output 1050 ml  Net -810 ml     Physical Exam: General:  Well appearing. No resp difficulty HEENT: normal Neck: supple. JVP 7. Carotids 2+ bilat; no bruits. No lymphadenopathy or thryomegaly appreciated. Cor: PMI nondisplaced. Irregular rate & rhythm. No rubs, gallops or murmurs. Lungs: clear Abdomen: obese  soft, nontender, nondistended. No hepatosplenomegaly. No bruits or masses. Good bowel sounds. Extremities: no cyanosis, clubbing, rash, edema Neuro: alert & orientedx3, cranial nerves grossly intact. moves all 4 extremities w/o difficulty. Affect pleasant  Telemetry: AF 80-90s. Personally reviewed   Labs: Basic Metabolic Panel: Recent Labs  Lab 04/16/17 1532 04/18/17 1230 04/22/17 0742 04/22/17 1842 04/23/17 0340  NA 127* 126* 126*  --  128*  K 5.7* 6.0* 4.3  --  4.1  CL 91* 87* 90*  --  91*  CO2 _0 --  26  GLUCOSE 439* 408* 469*  --  260*  BUN 39* 47* 40*  --  44*  CREATININE 1.57* 1.73* 1.44*  --  1.58*  CALCIUM 9.5 9.9 8.0*  --  7.6*  MG  --   --   --  2.0  --     Liver Function Tests: Recent Labs  Lab 04/16/17 1532  AST 28  ALT 23    ALKPHOS 172*  BILITOT 0.6  PROT 8.2*  ALBUMIN 3.4*   No results for input(s): LIPASE, AMYLASE in the last 168 hours. No results for input(s): AMMONIA in the last 168 hours.  CBC: Recent Labs  Lab 04/16/17 1532 04/22/17 0742  WBC 13.1* 12.7*  HGB 14.1 13.5  HCT 41.8 40.4  MCV 83.1 81.8  PLT 287 236    Cardiac Enzymes: No results for input(s): CKTOTAL, CKMB, CKMBINDEX, TROPONINI in the last 168 hours.  BNP: BNP (last 3 results) Recent Labs    12/17/16 1430 03/02/17 2319 03/06/17 1050  BNP 394.2* 381.6* 276.3*    ProBNP (last 3 results) No results for input(s): PROBNP in the last 8760 hours.    Other results:  Imaging: Ct Chest High Resolution  Result Date: 04/23/2017 CLINICAL DATA:  Shortness of breath. EXAM: CT CHEST WITHOUT CONTRAST TECHNIQUE: Multidetector CT imaging of the chest was performed following the standard protocol without intravenous contrast. High resolution imaging of the lungs, as well as inspiratory and expiratory imaging, was performed. COMPARISON:  Chest radiograph 03/06/2017 and CT chest 10/11/2014. FINDINGS: Cardiovascular: Atherosclerotic calcification of the arterial vasculature. Pulmonary arteries and heart are enlarged. No pericardial effusion. Mediastinum/Nodes: Mediastinal lymph nodes measure up to 12 mm  in the low right paratracheal station, as before. Hilar regions are difficult to evaluate without IV contrast. No axillary adenopathy. Esophagus is grossly unremarkable. Lungs/Pleura: Mild upper and midlung zone predominant subpleural reticulation. No traction bronchiolectasis, ground-glass or honeycombing. Expiratory phase imaging was not performed in expiration, limiting evaluation for air trapping. No pleural fluid airway is unremarkable. Upper Abdomen: Visualized portions of the liver, gallbladder and adrenal glands are unremarkable. 3.9 cm low-attenuation lesion in the right kidney is incompletely imaged. Small stones in the left kidney.  Visualized portions of the spleen, pancreas, stomach and bowel are grossly unremarkable. Upper abdominal lymph nodes are not enlarged by CT size criteria. Musculoskeletal: Degenerative changes in the spine. No worrisome lytic or sclerotic lesions. IMPRESSION: 1. Mild upper/mid lung zone predominant subpleural densities. Fibrotic interstitial lung disease such as fibrotic interstitial pneumonitis cannot be excluded. 2.  Aortic atherosclerosis (ICD10-170.0). 3. Enlarged pulmonary arteries, indicative of pulmonary arterial hypertension. 4. Left renal stones. Electronically Signed   By: Lorin Picket M.D.   On: 04/23/2017 09:31      Medications:     Scheduled Medications: . allopurinol  200 mg Oral Daily  . atorvastatin  40 mg Oral q1800  . digoxin  0.0625 mg Oral Daily  . furosemide  80 mg Intravenous BID  . gabapentin  300 mg Oral Daily  . glipiZIDE  10 mg Oral BID AC  . hydrALAZINE  25 mg Oral Q8H  . insulin aspart  0-15 Units Subcutaneous TID WC  . insulin aspart  0-5 Units Subcutaneous QHS  . insulin aspart  4 Units Subcutaneous TID WC  . insulin glargine  20 Units Subcutaneous QHS  . isosorbide mononitrate  30 mg Oral Daily  . sodium chloride flush  3 mL Intravenous Q12H  . sodium chloride flush  3 mL Intravenous Q12H     Infusions: . sodium chloride    . sodium chloride    . sodium chloride 10 mL/hr at 04/23/17 0548     PRN Medications:  sodium chloride, sodium chloride, acetaminophen, albuterol, diclofenac sodium, ondansetron (ZOFRAN) IV, sodium chloride flush, sodium chloride flush   Assessment:   Jacob [name] a 62 y.o.[place] male with history of systolic HF EF 44-96% via Echo 08/03/14 s/p INCEPTA ICD implant 6/14, RV dysfunction, CAD s/p CABG x 2 w/ RF MAZE 7/14 [facility], DM type II, Chronic afib on xarelto, GI bleed 11/28/2014, and HLD.   Admitted for cath due to progressive exertional CP and HF.     Plan/Discussion:     1. CAD s/p CABG 2014 with  progressive exertional CP - Cath today with stable CAD. Will hydrate overnight and monitor creatinine. Continue statin. No AsA with Xarelto   2.Acute on chronic Systolic Heart Failure: Echo 10/2015 EF 25-30%. Has [employer] ICD.  - NYHA II-III symptoms - Volume status improved with diuresis.  - RHC today with relatively well compensated filling pressures. Hold lasix post-cath - Continue spiro 25 mg daily.  - Continue digoxin 0.0625 mg daily.  - Continue hydralazine 25 mg TID.  - No beta blocker with acute decompensation although he is not currently on one.  -Reinforced fluid restriction to < 2 L daily, sodium restriction to less than 2000 mg daily, and the importance of daily weights.    3. Chronic A fib -Rate controlled overall. - On Xarelto for anticoagulation. Restart post-cath - Denies bleeding.CBC stable -CXR at last visit with chronic interstitial prominence which has progressed from 2017. - Hi-res CT with mild ILD. ESR  elevated. Holding amio   4. H/O GI Bleed - duodenal ulcer with clip 11/2003 -Hgb stable.   5. PAD- Last ABI 2015  - R EIA stent with known bilateral SFA occlusion.  - Follows with Dr. Gwenlyn Found: "He is not a candidate for endovascular therapy of his SFAs and would require femoropopliteal bypass grafting which I think he would be high risk for given his cardiac situation" - Continues to smoke. Encouraged complete cessation.No change.  6. Social Issues - Resides in low incomehousing. - Continue paramedicine.No change.   7. Smoking -Encouraged complete cessation.No change.   8. HTN -Controlled. Meds as above.   9 Hyperkalemia - Improved. Likely in setting of AKI - Holding K and spiro.   10. DM2 - Poorly controlled with CBGs> 400 - Will cover with SSI. Will check Hgba1c.  - DM2 coordinator consult  11. CKD 3 - hydrate gently post-cath. Watch renal function. Home in am if stable.    Length of Stay: 1   Glori Bickers  MD 04/23/2017, 9:50 AM  Advanced Heart Failure Team Pager [phone] (M-F; 7a - 4p)  Please contact [facility] for night-coverage after hours (4p -7a ) and weekends on [email or web address]

## 2017-04-23 NOTE — Progress Notes (Signed)
Inpatient Diabetes Program Recommendations  AACE/ADA: New Consensus Statement on Inpatient Glycemic Control (2015)  Target Ranges:  Prepandial:   less than 140 mg/dL      Peak postprandial:   less than 180 mg/dL (1-2 hours)      Critically ill patients:  140 - 180 mg/dL   Lab Results  Component Value Date   GLUCAP 298 (H) 04/23/2017   HGBA1C 8.5 (H) 12/17/2016    Review of Glycemic Control Results for Jacob [name], Jacob [name] (MRN [id number]) as of 04/23/2017 13:10  Ref. Range 04/22/2017 11:36 04/22/2017 17:06 04/22/2017 21:32 04/23/2017 08:04 04/23/2017 12:22  Glucose-Capillary Latest Ref Range: 65 - 99 mg/dL 160 (H) 737 (H) 106 (H) 211 (H) 298 (H)    Diabetes history: Type 2 DM Outpatient Diabetes medications:  Glipizide 10 mg bid, Lantus 30 units q HS Current orders for Inpatient glycemic control:  Novolog moderate tid with meals and HS Novolog 4 units tid with meals Lantus 20 units q HS  Inpatient Diabetes Program Recommendations:    Please increase Lantus to home dose of 30 units daily. Also consider checking A1C to determine pre-hospitalization glycemic control.   Thanks,  Beryl Meager, RN, BC-ADM Inpatient Diabetes Coordinator Pager [phone] (8a-5p)

## 2017-04-24 LAB — POCT I-STAT 3, VENOUS BLOOD GAS (G3P V)
ACID-BASE EXCESS: 1 mmol/L (ref 0.0–2.0)
ACID-BASE EXCESS: 2 mmol/L (ref 0.0–2.0)
BICARBONATE: 28.2 mmol/L — AB (ref 20.0–28.0)
Bicarbonate: 27.6 mmol/L (ref 20.0–28.0)
O2 SAT: 60 %
O2 SAT: 61 %
PCO2 VEN: 51.1 mmHg (ref 44.0–60.0)
TCO2: 29 mmol/L (ref 22–32)
TCO2: 30 mmol/L (ref 22–32)
pCO2, Ven: 51.3 mmHg (ref 44.0–60.0)
pH, Ven: 7.341 (ref 7.250–7.430)
pH, Ven: 7.348 (ref 7.250–7.430)
pO2, Ven: 34 mmHg (ref 32.0–45.0)
pO2, Ven: 34 mmHg (ref 32.0–45.0)

## 2017-04-24 LAB — BASIC METABOLIC PANEL
Anion gap: 10 (ref 5–15)
BUN: 35 mg/dL — AB (ref 6–20)
CHLORIDE: 95 mmol/L — AB (ref 101–111)
CO2: 27 mmol/L (ref 22–32)
CREATININE: 1.27 mg/dL — AB (ref 0.61–1.24)
Calcium: 7.9 mg/dL — ABNORMAL LOW (ref 8.9–10.3)
GFR calc Af Amer: >60 mL/min (ref >60)
GFR calc non Af Amer: 60 mL/min — ABNORMAL LOW (ref >60)
GLUCOSE: 190 mg/dL — AB (ref 65–99)
POTASSIUM: 5.1 mmol/L (ref 3.5–5.1)
Sodium: 132 mmol/L — ABNORMAL LOW (ref 135–145)

## 2017-04-24 LAB — POCT I-STAT 3, ART BLOOD GAS (G3+)
Acid-base deficit: 1 mmol/L (ref 0.0–2.0)
BICARBONATE: 23.8 mmol/L (ref 20.0–28.0)
O2 SAT: 98 %
PO2 ART: 101 mmHg (ref 83.0–108.0)
TCO2: 25 mmol/L (ref 22–32)
pCO2 arterial: 37.2 mmHg (ref 32.0–48.0)
pH, Arterial: 7.414 (ref 7.350–7.450)

## 2017-04-24 LAB — GLUCOSE, CAPILLARY
Glucose-Capillary: 323 mg/dL — ABNORMAL HIGH (ref 65–99)
Glucose-Capillary: 246 mg/dL — ABNORMAL HIGH (ref 65–99)

## 2017-04-24 MED ORDER — HYDRALAZINE HCL 25 MG PO TABS: ORAL_TABLET | ORAL | 3 refills | Status: DC

## 2017-04-24 MED ORDER — GABAPENTIN 300 MG PO CAPS: 300.0000 mg | ORAL_CAPSULE | Freq: Every day | ORAL | Status: DC

## 2017-04-24 NOTE — Discharge Summary (Signed)
Advanced Heart Failure Discharge Note  Discharge Summary   Patient ID: Jacob [name] MRN: [id number], DOB/AGE: 1956-03-16 62 y.o. Admit date: 04/22/2017 D/C date:     04/24/2017   Primary Discharge Diagnoses:  1. CAD s/p CABG 2014 with progressive exertional CP -> Cath 04/23/17 with stable CAD.  2.Acute on chronic Systolic Heart Failure: Echo 10/2015 EF 25-30%. Has [employer] ICD.  3. Chronic A fib 4. H/O GI Bleed - duodenal ulcer with clip 11/2003 5. PAD- Last ABI 2015  6. Social Issues 7. Smoking 8. HTN 9Hyperkalemia 10. DM2 11. CKD 3  Hospital Course:   Jacob [name] is a 62 y.o. male with history of systolic HF EF 32-20% via Echo 08/03/14 s/p INCEPTA ICD implant 6/14, RV dysfunction, CAD s/p CABG x 2 w/ RF MAZE 7/14 at [facility], DM type II, Chronic afib on xarelto, GI bleed 11/28/2014, and HLD.  Admitted for cath due to progressive exertional CP and HF.   Cath 04/23/17 with stable CAD and well compensated filling pressures as below.  Lasix held post cath for hydration.     High res chest CT done 04/23/17 with elevated ESR on amiodarone.  Consistent with mild ILD. No further at this time. Amiodarone on hold.   Pt remained stable and creatinine improved with gentle hydration post cath.  Home meds resumed for discharge. Spironolactone resumed post cath, but then had to be stopped with ongoing Hyper K.  Saw am of 04/23/17 and thought stable for discharge home with close follow up as below.   Physical Exam  General: Well appearing. No resp difficulty. HEENT: Normal Neck: Supple. JVP 6-7 cm. Carotids 2+ bilat; no bruits. No thyromegaly or nodule noted. Cor: PMI nondisplaced. Irregularly irregular, No M/G/R noted Lungs: CTAB, normal effort. Abdomen: Soft, non-tender, non-distended, no HSM. No bruits or masses. +BS  Extremities: No cyanosis, clubbing, or rash. R and LLE no edema.  Neuro: Alert & orientedx3, cranial nerves grossly intact. moves all 4 extremities w/o difficulty.  Affect pleasant   Discharge Weight Range: 247 lbs Discharge Vitals: Blood pressure 118/69, pulse 96, temperature 97.9 F (36.6 C), resp. rate 20, height '5\' 5"'$  (1.651 m), weight 247 lb 4.8 oz (112.2 kg), SpO2 98 %.  Labs: Lab Results  Component Value Date   WBC 12.7 (H) 04/22/2017   HGB 13.5 04/22/2017   HCT 40.4 04/22/2017   MCV 81.8 04/22/2017   PLT 236 04/22/2017    Recent Labs  Lab 04/24/17 0506  NA 132*  K 5.1  CL 95*  CO2 27  BUN 35*  CREATININE 1.27*  CALCIUM 7.9*  GLUCOSE 190*   Lab Results  Component Value Date   CHOL 120 11/11/2016   HDL 33 (L) 11/11/2016   LDLCALC 67 11/11/2016   TRIG 102 11/11/2016   BNP (last 3 results) Recent Labs    12/17/16 1430 03/02/17 2319 03/06/17 1050  BNP 394.2* 381.6* 276.3*   ProBNP (last 3 results) No results for input(s): PROBNP in the last 8760 hours.  Diagnostic Studies/Procedures   Ct Chest High Resolution  Result Date: 04/23/2017 CLINICAL DATA:  Shortness of breath. EXAM: CT CHEST WITHOUT CONTRAST TECHNIQUE: Multidetector CT imaging of the chest was performed following the standard protocol without intravenous contrast. High resolution imaging of the lungs, as well as inspiratory and expiratory imaging, was performed. COMPARISON:  Chest radiograph 03/06/2017 and CT chest 10/11/2014. FINDINGS: Cardiovascular: Atherosclerotic calcification of the arterial vasculature. Pulmonary arteries and heart are enlarged. No pericardial effusion. Mediastinum/Nodes: Mediastinal lymph nodes measure  up to 12 mm in the low right paratracheal station, as before. Hilar regions are difficult to evaluate without IV contrast. No axillary adenopathy. Esophagus is grossly unremarkable. Lungs/Pleura: Mild upper and midlung zone predominant subpleural reticulation. No traction bronchiolectasis, ground-glass or honeycombing. Expiratory phase imaging was not performed in expiration, limiting evaluation for air trapping. No pleural fluid airway is  unremarkable. Upper Abdomen: Visualized portions of the liver, gallbladder and adrenal glands are unremarkable. 3.9 cm low-attenuation lesion in the right kidney is incompletely imaged. Small stones in the left kidney. Visualized portions of the spleen, pancreas, stomach and bowel are grossly unremarkable. Upper abdominal lymph nodes are not enlarged by CT size criteria. Musculoskeletal: Degenerative changes in the spine. No worrisome lytic or sclerotic lesions. IMPRESSION: 1. Mild upper/mid lung zone predominant subpleural densities. Fibrotic interstitial lung disease such as fibrotic interstitial pneumonitis cannot be excluded. 2.  Aortic atherosclerosis (ICD10-170.0). 3. Enlarged pulmonary arteries, indicative of pulmonary arterial hypertension. 4. Left renal stones. Electronically Signed   By: Lorin Picket M.D.   On: 04/23/2017 09:31   L/RHC 04/23/17  Ost RCA to Prox RCA lesion is 100% stenosed.  Ost Cx to Prox Cx lesion is 99% stenosed.  Ost LAD to Prox LAD lesion is 40% stenosed.  Prox LAD to Mid LAD lesion is 40% stenosed.  Dist LAD lesion is 70% stenosed.   Findings:  Ao = 126/66 (86)  LV = 118/14 RA = 8 RV = 44/8 PA = 52/17 (34) PCW = 17 Fick cardiac output/index = 4.2/2.0 PVR = 4.0 WU Ao sat = 98% PA sat = 60%, 61%  Assessment: 1. 3v CAD with stable revascularization with patent SVG to LPDA and SVG to Ramus 2. LAD with non-obstructive disease 3. Filling pressures normal 4. Moderately depressed cardiac output  Discharge Medications   Allergies as of 04/24/2017      Reactions   Pork-derived Products Other (See Comments)   Religious preference      Medication List    STOP taking these medications   cetirizine 10 MG tablet Commonly known as:  ZYRTEC     TAKE these medications   ACCU-CHEK AVIVA device Use as instructed1 times daily before meals   accu-chek softclix lancets Use as instructed for once daily testing of blood sugar   ACCU-CHEK SOFTCLIX  LANCETS lancets Use for once daily testing of blood sugar   albuterol 108 (90 Base) MCG/ACT inhaler Commonly known as:  PROVENTIL HFA;VENTOLIN HFA Inhale 2 puffs into the lungs every 6 (six) hours as needed for wheezing or shortness of breath.   albuterol (2.5 MG/3ML) 0.083% nebulizer solution Commonly known as:  PROVENTIL Take 3 mLs (2.5 mg total) by nebulization every 6 (six) hours as needed for wheezing or shortness of breath.   allopurinol 100 MG tablet Commonly known as:  ZYLOPRIM Take 2 tablets (200 mg total) by mouth daily.   atorvastatin 40 MG tablet Commonly known as:  LIPITOR Take 1 tablet (40 mg total) by mouth daily at 6 PM.   diclofenac sodium 1 % Gel Commonly known as:  VOLTAREN Apply 4 g topically 4 (four) times daily. 100/16=7 What changed:  See the new instructions.   digoxin 0.125 MG tablet Commonly known as:  LANOXIN Take 0.5 tablets (0.0625 mg total) by mouth daily.   gabapentin 300 MG capsule Commonly known as:  NEURONTIN Take 1 capsule (300 mg total) by mouth daily.   glipiZIDE 10 MG tablet Commonly known as:  GLUCOTROL Take 1 tablet (10 mg total)  by mouth 2 (two) times daily before a meal.   glucose blood test strip Commonly known as:  ACCU-CHEK AVIVA Use as instructed for 1 times daily testing of blood sugar   hydrALAZINE 25 MG tablet Commonly known as:  APRESOLINE take 1 TABLET BY MOUTH THREE TIMES DAILY every morning, noon,evening What changed:    how much to take  how to take this  when to take this  additional instructions  Another medication with the same name was removed. Continue taking this medication, and follow the directions you see here.   Insulin Glargine 100 UNIT/ML Solostar Pen Commonly known as:  LANTUS Inject 30 Units into the skin daily at 10 pm.   Insulin Pen Needle 31G X 5 MM Misc Use as directed for once daily insulin injection   isosorbide mononitrate 30 MG 24 hr tablet Commonly known as:  IMDUR Take 1  tablet (30 mg total) by mouth daily.   metolazone 2.5 MG tablet Commonly known as:  ZAROXOLYN Take 1 tablet once as directed by CHF clinic.   rivaroxaban 20 MG Tabs tablet Commonly known as:  XARELTO take one '20mg'$  tablet daily by mouth What changed:    how much to take  how to take this  when to take this  additional instructions   torsemide 20 MG tablet Commonly known as:  DEMADEX Take 4 tablets (80 mg total) by mouth 2 (two) times daily.      Disposition   The patient will be discharged in stable condition to home with close follow up as below.   Follow-up Information    [facility] Follow up on 05/01/2017.   Specialty:  Cardiology Why:  at 0900 for post hospital follow up. The code for parking is 9001. Can also park in lower ED lot and enter through blue awning.  Contact information: [address] [id number] [place] [phone]           Duration of Discharge Encounter: Greater than 35 minutes   Signed, Annamaria Helling 04/24/2017, 10:30 AM

## 2017-04-24 NOTE — Care Management Note (Signed)
Case Management Note  Patient Details  Name: Jacob [name] MRN: [id number] Date of Birth: 1955/11/17  Subjective/Objective:  Pt presented for cath due to progressive Chest Pain and Heart Failure. PTA from home and has a HH Aide. Paramedicine team follows him in the community and pt will f/u with the Heart Failure Clinic.                  Action/Plan: CM did discuss with pt HH RN for Disease Management of HF. Pt states he is agreeable to services. Agency List provided and pt chose [facility]. Referral made to Liaison Dan and [facility] to begin within 24-48 hours post d/c. No further needs from CM @ this time.    Expected Discharge Date:  04/24/17               Expected Discharge Plan:  Home w Home Health Services  In-House Referral:  Clinical Social Work(cab voucher)  Discharge planning Services  CM Consult, HF Clinic  Post Acute Care Choice:  Home Health Choice offered to:  Patient  DME Arranged:  N/A DME Agency:  NA  HH Arranged:  Social Work, Charity fundraiser, Disease Management HH Agency:  [facility]  Status of Service:  Completed, signed off  If discussed at [employer], dates discussed:    Additional Comments:  Gala Lewandowsky, RN 04/24/2017, 12:58 PM

## 2017-04-25 ENCOUNTER — Telehealth: Payer: Self-pay | Admitting: Family Medicine

## 2017-04-25 NOTE — Telephone Encounter (Signed)
Called patient following his discharge. Patient stated that he is doing "good". Asked patient if he had any support at home from family and friends and patient stated "no" but that he has [place], from paramedicine, coming out to see him twice a week and he's ok with that. If he has any questions or concerns he asks her. Patient also stated that he is currently taking his medication.   Informed patient of the [facility]CC clinic and he agreed but stated that he needed transportation to his appointment. Scheduled hospital f/u for Friday 2/8 at 2:00pm as per patient's request. Informed patient that Erskine Squibb or I will be calling him to check on his status next week and to arrange transportation. No further questions or concerns.

## 2017-04-27 DIAGNOSIS — Z79899 Other long term (current) drug therapy: Secondary | ICD-10-CM | POA: Diagnosis not present

## 2017-04-27 DIAGNOSIS — Z95810 Presence of automatic (implantable) cardiac defibrillator: Secondary | ICD-10-CM | POA: Diagnosis not present

## 2017-04-27 DIAGNOSIS — E1151 Type 2 diabetes mellitus with diabetic peripheral angiopathy without gangrene: Secondary | ICD-10-CM | POA: Diagnosis not present

## 2017-04-27 DIAGNOSIS — Z7901 Long term (current) use of anticoagulants: Secondary | ICD-10-CM | POA: Diagnosis not present

## 2017-04-27 DIAGNOSIS — N183 Chronic kidney disease, stage 3 (moderate): Secondary | ICD-10-CM | POA: Diagnosis not present

## 2017-04-27 DIAGNOSIS — F17210 Nicotine dependence, cigarettes, uncomplicated: Secondary | ICD-10-CM | POA: Diagnosis not present

## 2017-04-27 DIAGNOSIS — Z794 Long term (current) use of insulin: Secondary | ICD-10-CM | POA: Diagnosis not present

## 2017-04-27 DIAGNOSIS — I5023 Acute on chronic systolic (congestive) heart failure: Secondary | ICD-10-CM | POA: Diagnosis not present

## 2017-04-27 DIAGNOSIS — Z951 Presence of aortocoronary bypass graft: Secondary | ICD-10-CM | POA: Diagnosis not present

## 2017-04-27 DIAGNOSIS — I482 Chronic atrial fibrillation: Secondary | ICD-10-CM | POA: Diagnosis not present

## 2017-04-27 DIAGNOSIS — Z955 Presence of coronary angioplasty implant and graft: Secondary | ICD-10-CM | POA: Diagnosis not present

## 2017-04-27 DIAGNOSIS — E1122 Type 2 diabetes mellitus with diabetic chronic kidney disease: Secondary | ICD-10-CM | POA: Diagnosis not present

## 2017-04-27 DIAGNOSIS — I2510 Atherosclerotic heart disease of native coronary artery without angina pectoris: Secondary | ICD-10-CM | POA: Diagnosis not present

## 2017-04-27 DIAGNOSIS — I130 Hypertensive heart and chronic kidney disease with heart failure and stage 1 through stage 4 chronic kidney disease, or unspecified chronic kidney disease: Secondary | ICD-10-CM | POA: Diagnosis not present

## 2017-04-28 ENCOUNTER — Telehealth: Payer: Self-pay

## 2017-04-28 NOTE — Telephone Encounter (Signed)
Transitional Care Clinic Post-discharge Follow-Up Phone Call:  Date of Discharge:  04/24/2017 Principal Discharge Diagnosis(es):  CAD - s/p cath on 04/23/2017, acute on chronic systolic heart failure, chronic a fib, DM2 Post-discharge Communication: (Clearly document all attempts clearly and date contact made)  - call placed to the patient  Call Completed: Yes                    With Whom: patient Interpreter Needed: no     Please check all that apply:  X Patient is knowledgeable of his/her condition(s) and/or treatment. X  Patient is caring for self at home.  - as per patient, no support from family or friends.  ? Patient is receiving assist at home from family and/or caregiver. Family and/or caregiver is knowledgeable of patient's condition(s) and/or treatment. X  Patient is receiving home health services. If so, name of agency. - [facility]: RN and SW.  The patient stated that the home health nurse saw him yesterday - 04/27/2017     Medication Reconciliation:  ? Medication list reviewed with patient. X  Patient obtained all discharge medications. If not, why? - he stated that he has all of his medications. He was not at home and did not have his list of medications with him. He said that [facility] Copper, EMT was going to see him tomorrow. He was anxious to get off of the phone. He did not report any questions/concerns about his medications.    Activities of Daily Living:     Independent X  Needs assist (describe; ? home DME used) - he receives PCS 2 hours x 7 days/week.  He stated that he has a glucometer and scale at home  ? [facility] (describe, ? home DME used)   Community resources in place for patient:  ? None  X  Home Health/Home DME  -  [facility]. ? Assisted Living ? Support Group   He is well known to the [facility].         Questions/Concerns discussed: he confirmed that he has an appointment at the heart failure clinic on 04/30/16 and and appointment here at [facility] on 05/02/17 @ 1400. He  stated that he will need transportation to the clinic. Informed him that cab can be arranged and he was very appreciative.

## 2017-04-29 ENCOUNTER — Other Ambulatory Visit (HOSPITAL_COMMUNITY): Payer: Self-pay

## 2017-04-29 ENCOUNTER — Telehealth: Payer: Self-pay

## 2017-04-29 NOTE — Progress Notes (Signed)
I went by pt's residence because i've not been able to make phone contact with him. Pt had some medication errors in the past and I would like to f/u to verify his pill packs.  Theres no answer at the door and he hasn't returned my calls.  I will f/u this week to check on him

## 2017-04-29 NOTE — Telephone Encounter (Signed)
Call received from [facility] Copper, EMT noting that she is hoping to see the patient today and will review his medications.  She noted that his PCS has been re-instated and she has reminded the patient that it is important for him to be home in order to continue to receive the services.

## 2017-04-30 ENCOUNTER — Ambulatory Visit (HOSPITAL_COMMUNITY)
Admission: RE | Admit: 2017-04-30 | Discharge: 2017-04-30 | Disposition: A | Discharge status: Home / Self Care | Payer: Medicare Other | Source: Ambulatory Visit | Attending: Internal Medicine | Admitting: Internal Medicine

## 2017-04-30 ENCOUNTER — Other Ambulatory Visit (HOSPITAL_COMMUNITY): Payer: Self-pay

## 2017-04-30 ENCOUNTER — Encounter (HOSPITAL_COMMUNITY): Payer: Self-pay

## 2017-04-30 ENCOUNTER — Telehealth: Payer: Self-pay

## 2017-04-30 VITALS — BP 124/84 | HR 92 | Wt 245.0 lb

## 2017-04-30 DIAGNOSIS — I252 Old myocardial infarction: Secondary | ICD-10-CM | POA: Diagnosis not present

## 2017-04-30 DIAGNOSIS — Z95810 Presence of automatic (implantable) cardiac defibrillator: Secondary | ICD-10-CM | POA: Diagnosis not present

## 2017-04-30 DIAGNOSIS — Z720 Tobacco use: Secondary | ICD-10-CM

## 2017-04-30 DIAGNOSIS — F17210 Nicotine dependence, cigarettes, uncomplicated: Secondary | ICD-10-CM | POA: Insufficient documentation

## 2017-04-30 DIAGNOSIS — I110 Hypertensive heart disease with heart failure: Secondary | ICD-10-CM | POA: Diagnosis not present

## 2017-04-30 DIAGNOSIS — Z596 Low income: Secondary | ICD-10-CM | POA: Diagnosis not present

## 2017-04-30 DIAGNOSIS — Z91199 Patient's noncompliance with other medical treatment and regimen due to unspecified reason: Secondary | ICD-10-CM

## 2017-04-30 DIAGNOSIS — I481 Persistent atrial fibrillation: Secondary | ICD-10-CM | POA: Diagnosis not present

## 2017-04-30 DIAGNOSIS — Z951 Presence of aortocoronary bypass graft: Secondary | ICD-10-CM | POA: Insufficient documentation

## 2017-04-30 DIAGNOSIS — I10 Essential (primary) hypertension: Secondary | ICD-10-CM

## 2017-04-30 DIAGNOSIS — Z7901 Long term (current) use of anticoagulants: Secondary | ICD-10-CM | POA: Diagnosis not present

## 2017-04-30 DIAGNOSIS — I5022 Chronic systolic (congestive) heart failure: Secondary | ICD-10-CM | POA: Diagnosis not present

## 2017-04-30 DIAGNOSIS — Z9119 Patient's noncompliance with other medical treatment and regimen: Secondary | ICD-10-CM | POA: Diagnosis not present

## 2017-04-30 DIAGNOSIS — E7800 Pure hypercholesterolemia, unspecified: Secondary | ICD-10-CM | POA: Diagnosis not present

## 2017-04-30 DIAGNOSIS — I482 Chronic atrial fibrillation: Secondary | ICD-10-CM | POA: Insufficient documentation

## 2017-04-30 DIAGNOSIS — E876 Hypokalemia: Secondary | ICD-10-CM | POA: Diagnosis not present

## 2017-04-30 DIAGNOSIS — I2510 Atherosclerotic heart disease of native coronary artery without angina pectoris: Secondary | ICD-10-CM | POA: Diagnosis not present

## 2017-04-30 DIAGNOSIS — I4819 Other persistent atrial fibrillation: Secondary | ICD-10-CM

## 2017-04-30 DIAGNOSIS — I5042 Chronic combined systolic (congestive) and diastolic (congestive) heart failure: Secondary | ICD-10-CM | POA: Diagnosis not present

## 2017-04-30 DIAGNOSIS — Z794 Long term (current) use of insulin: Secondary | ICD-10-CM | POA: Diagnosis not present

## 2017-04-30 DIAGNOSIS — E1151 Type 2 diabetes mellitus with diabetic peripheral angiopathy without gangrene: Secondary | ICD-10-CM | POA: Insufficient documentation

## 2017-04-30 LAB — BASIC METABOLIC PANEL
ANION GAP: 10 (ref 5–15)
BUN: 38 mg/dL — AB (ref 6–20)
CO2: 26 mmol/L (ref 22–32)
Calcium: 8.7 mg/dL — ABNORMAL LOW (ref 8.9–10.3)
Chloride: 91 mmol/L — ABNORMAL LOW (ref 101–111)
Creatinine, Ser: 1.31 mg/dL — ABNORMAL HIGH (ref 0.61–1.24)
GFR calc Af Amer: >60 mL/min (ref >60)
GFR calc non Af Amer: 58 mL/min — ABNORMAL LOW (ref >60)
Glucose, Bld: 422 mg/dL — ABNORMAL HIGH (ref 65–99)
POTASSIUM: 4.8 mmol/L (ref 3.5–5.1)
Sodium: 127 mmol/L — ABNORMAL LOW (ref 135–145)

## 2017-04-30 NOTE — Addendum Note (Signed)
Encounter addended by: Chyrl Civatte, RN on: 04/30/2017 3:52 PM  Actions taken: Visit diagnoses modified, Order list changed, Diagnosis association updated, Sign clinical note

## 2017-04-30 NOTE — Patient Instructions (Signed)
NO changes to medication at this time.  Routine lab work today. Will notify you of abnormal results, otherwise no news is good news!  Follow up 1 month.  __________________________________________________________ [place] Code: 9002  Take all medication as prescribed the day of your appointment. Bring all medications with you to your appointment.  Do the following things EVERYDAY: 1) Weigh yourself in the morning before breakfast. Write it down and keep it in a log. 2) Take your medicines as prescribed 3) Eat low salt foods-Limit salt (sodium) to 2000 mg per day.  4) Stay as active as you can everyday 5) Limit all fluids for the day to less than 2 liters

## 2017-04-30 NOTE — Telephone Encounter (Signed)
Met with Abelino Derrick, [employer] [facility] [employer], she confirmed receipt of the referral and stated that they will be reaching out to the patient.

## 2017-04-30 NOTE — Progress Notes (Signed)
Paramedicine Encounter   Patient ID: Jacob [name] , male,   DOB: [birth year],61 y.o.,  MRN: [id number]   Met patient in clinic today with provider Jacob [name].   Pt came into the clinic a little later than his appointed time. The clinic was able to provide taxi fare for him to get back and forth.  Today's appointment is his post hospital appointment.  Pt didn't bring his pill packs to this appointment.  I will have to go by his residence tomorrow to make sure the pill packs are filled corrected.   Pt stated taht he still feels tired but hes sleeping better at night.  He will also have advance home nurse come by tomorrow to do an assessment.  Time spent with patient 19mns  Jacob [name], EMT-Paramedic 04/30/2017   ACTION: Next visit planned for tomorrow

## 2017-04-30 NOTE — Progress Notes (Signed)
Patient ID: Jacob [name], male   DOB: Mar 16, 1956, 62 y.o.   MRN: [id number]    Advanced Heart Failure Clinic Note   PCP: [facility] Cardiologist: Dr Gala Romney   HPI: Roth Ress is a 62 y.o. male [place] male with history of systolic HF EF 20-25% via Echo 08/03/14 s/p INCEPTA ICD implant 6/14, RV dysfunction, CAD s/p CABG x 2 w/ RF MAZE 7/14 at [facility], DM type II, Chronic afib on xarelto, GI bleed 11/28/2014, and HLD.   Admitted the end of August 2016  with increased dyspnea on exertion. Later found to be in cardiogenic shock . At one point on dual pressors milrinone and norepi. Diuresed with IV lasix and transitioned to po lasix. Hospital course complicated by GI bleed and A fib RVR. Loaded on amio but later placed on toprol xl for rate control. On 9/5 had EGD with duodenal bleed with clip applied. He was discharged 9/12 with D/C weight 203 pounds.   Admitted 1/29 - 04/24/17 with CP and HF. Cath 04/23/17 as below with stable CAD and compensated filling pressures after IV diuresis.   Pt presents today for regular follow up. Feeling great since hospitalization. No further CP. Denies with ADLs or on flat ground. Denies lightheadedness or dizziness. Still smoking ~ 1/2 ppd. Denies orthopnea. Reports he is taking all medication as directed.   SH: Lives in low income housing. Estranged from family  [facility] 04/23/17  Ost RCA to Prox RCA lesion is 100% stenosed.  Ost Cx to Prox Cx lesion is 99% stenosed.  Ost LAD to Prox LAD lesion is 40% stenosed.  Prox LAD to Mid LAD lesion is 40% stenosed.  Dist LAD lesion is 70% stenosed.   Findings:  Ao = 126/66 (86)  LV = 118/14 RA = 8 RV = 44/8 PA = 52/17 (34) PCW = 17 Fick cardiac output/index = 4.2/2.0 PVR = 4.0 WU Ao sat = 98% PA sat = 60%, 61%  Assessment: 1. 3v CAD with stable revascularization with patent SVG to LPDA and SVG to Ramus 2. LAD with non-obstructive disease 3. Filling pressures normal 4. Moderately  depressed cardiac output  Review of systems complete and found to be negative unless listed in HPI.    SH:  Social History   Socioeconomic History  . Marital status: Divorced    Spouse name: Not on file  . Number of children: 3  . Years of education: Not on file  . Highest education level: Not on file  Social Needs  . Financial resource strain: Not on file  . Food insecurity - worry: Not on file  . Food insecurity - inability: Not on file  . Transportation needs - medical: Not on file  . Transportation needs - non-medical: Not on file  Occupational History  . Occupation: disabled  Tobacco Use  . Smoking status: Current Every Day Smoker    Packs/day: 0.50    Years: 40.00    Pack years: 20.00    Types: Cigarettes  . Smokeless tobacco: Never Used  Substance and Sexual Activity  . Alcohol use: No    Alcohol/week: 0.0 oz  . Drug use: No  . Sexual activity: No  Other Topics Concern  . Not on file  Social History Narrative   Has an apartment with a roommate. He was living on the streets in 01/13/13.  He reports that his father died in [place] in 01-13-13.  He is divorced.  He is no longer estranged  from his son, but still from his daughter who lives locally.  Neither of his parents, nor any siblings have any history of CAD.    FH:  Family History  Problem Relation Age of Onset  . Diabetes Mother     Past Medical History:  Diagnosis Date  . Atrial fibrillation (HCC)    RVR 10/2014  . Automatic implantable cardioverter-defibrillator in situ   . CAD (coronary artery disease) Sept 2013   s/p cardiac cath showing occlusion of small RCA with collaterals  . CHF (congestive heart failure) (HCC)    20 to 25 % EF and RV dysfunction by 07/2014 echo   . Chronic anticoagulation    on xarelto.   . High cholesterol   . Hypertension   . Myocardial infarction (HCC) 2014  . Noncompliance    homelessness contributing.   . Peripheral arterial disease (HCC)   . Type II diabetes  mellitus (HCC)     Current Outpatient Medications  Medication Sig Dispense Refill  . ACCU-CHEK SOFTCLIX LANCETS lancets Use for once daily testing of blood sugar 100 each 12  . albuterol (PROVENTIL HFA;VENTOLIN HFA) 108 (90 Base) MCG/ACT inhaler Inhale 2 puffs into the lungs every 6 (six) hours as needed for wheezing or shortness of breath. 1 Inhaler 2  . albuterol (PROVENTIL) (2.5 MG/3ML) 0.083% nebulizer solution Take 3 mLs (2.5 mg total) by nebulization every 6 (six) hours as needed for wheezing or shortness of breath. 150 mL 1  . allopurinol (ZYLOPRIM) 100 MG tablet Take 2 tablets (200 mg total) by mouth daily. 60 tablet 5  . atorvastatin (LIPITOR) 40 MG tablet Take 1 tablet (40 mg total) by mouth daily at 6 PM. 30 tablet 5  . Blood Glucose Monitoring Suppl (ACCU-CHEK AVIVA) device Use as instructed1 times daily before meals 1 each 0  . diclofenac sodium (VOLTAREN) 1 % GEL Apply 4 g topically 4 (four) times daily. 100/16=7 (Patient taking differently: Apply 4 g topically 4 (four) times daily. AS NEEDED FOR PAIN) 100 g 1  . digoxin (LANOXIN) 0.125 MG tablet Take 0.5 tablets (0.0625 mg total) by mouth daily. 14 tablet 3  . gabapentin (NEURONTIN) 300 MG capsule Take 1 capsule (300 mg total) by mouth daily.    Marland Kitchen glipiZIDE (GLUCOTROL) 10 MG tablet Take 1 tablet (10 mg total) by mouth 2 (two) times daily before a meal. 60 tablet 5  . glucose blood (ACCU-CHEK AVIVA) test strip Use as instructed for 1 times daily testing of blood sugar 100 each 12  . hydrALAZINE (APRESOLINE) 25 MG tablet take 1 TABLET BY MOUTH THREE TIMES DAILY every morning, noon,evening 90 tablet 3  . Insulin Glargine (LANTUS) 100 UNIT/ML Solostar Pen Inject 30 Units into the skin daily at 10 pm. 4 pen 5  . Insulin Pen Needle 31G X 5 MM MISC Use as directed for once daily insulin injection 100 each 2  . isosorbide mononitrate (IMDUR) 30 MG 24 hr tablet Take 1 tablet (30 mg total) by mouth daily. 28 tablet 3  . Lancet Devices  (ACCU-CHEK SOFTCLIX) lancets Use as instructed for once daily testing of blood sugar 1 each 0  . metolazone (ZAROXOLYN) 2.5 MG tablet Take 1 tablet once as directed by CHF clinic. (Patient not taking: Reported on 04/17/2017) 3 tablet 0  . rivaroxaban (XARELTO) 20 MG TABS tablet take one 20mg  tablet daily by mouth (Patient taking differently: Take 20 mg by mouth daily with supper. ) 28 tablet 2  . torsemide (DEMADEX) 20 MG  tablet Take 4 tablets (80 mg total) by mouth 2 (two) times daily. 240 tablet 3   No current facility-administered medications for this encounter.    There were no vitals filed for this visit.  Wt Readings from Last 3 Encounters:  04/24/17 247 lb 4.8 oz (112.2 kg)  04/16/17 244 lb 12.8 oz (111 kg)  04/04/17 245 lb 6.4 oz (111.3 kg)     PHYSICAL EXAM: General: Well appearing. No resp difficulty. HEENT: Normal Neck: Supple. JVP 5-6. Carotids 2+ bilat; no bruits. No thyromegaly or nodule noted. Cor: PMI nondisplaced. RRR, No M/G/R noted Lungs: CTAB, normal effort. Abdomen: Soft, non-tender, non-distended, no HSM. No bruits or masses. +BS  Extremities: No cyanosis, clubbing, or rash. R and LLE no edema.  Neuro: Alert & orientedx3, cranial nerves grossly intact. moves all 4 extremities w/o difficulty. Affect pleasant   ASSESSMENT & PLAN: 1. Chronic Systolic Heart Failure: Echo 10/2015 EF 25-30%. Has [employer] ICD.  - NYHA II symptoms. [facility] 03/2017 as above with stable CAD and compensated filling pressures.  - Volume status stable on exam.  - Continue torsemide 80 mg BID. BMET today.  - Continue metolazone as needed.  - Continue spiro 25 mg daily.  - Continue digoxin 0.0625 mg daily.  - Continue hydralazine 25 mg TID.  - No beta blocker with acute decompensation although he is not currently on one.  - Reinforced fluid restriction to < 2 L daily, sodium restriction to less than 2000 mg daily, and the importance of daily weights.     2. CAD s/p CABG 2014 - R/[facility]  03/2017 as above with stable CAD and compensated filling pressures.  - No further CP with diuresis.   2. Chronic A fib - Rate controlled overall. On Amiodarone 200 mg daily.  - Continue Xarelto for anticoagulation. - High Res Chest CT with questionable fibrotic interstitial pneumonitis cannot be excluded.   3. H/O GI Bleed - duodenal ulcer with clip 11/2003 - Denies bleeding.    4. PAD- Last ABI 2015  - R EIA stent with known bilateral SFA occlusion.  - Follows with Dr. Allyson Sabal: "He is not a candidate for endovascular therapy of his SFAs and would require femoropopliteal bypass grafting which I think he would be high risk for given his cardiac situation" - Continues to smoke. Encouraged complete cessation. No change.   5. Social  Issues - Resides in low income housing.  - Nurse, adult. No change.  - HFSW to see today.   6. Smoking - Encouraged complete cessation. No change.   7. HTN - Well controlled on current meds.   8. Hypokalemia - BMET today.   Doing well post cath. No complaints. Continue current meds. Labs today. RTC 1 month, sooner with symptoms.   Graciella Freer, PA-C  3:08 PM   Greater than 50% of the 25 minute visit was spent in counseling/coordination of care regarding disease state education, salt/fluid restriction, sliding scale diuretics, and medication compliance.

## 2017-05-01 ENCOUNTER — Encounter (HOSPITAL_COMMUNITY): Payer: Medicare Other

## 2017-05-01 ENCOUNTER — Telehealth: Payer: Self-pay | Admitting: Family Medicine

## 2017-05-01 ENCOUNTER — Other Ambulatory Visit (HOSPITAL_COMMUNITY): Payer: Self-pay

## 2017-05-01 ENCOUNTER — Encounter (HOSPITAL_COMMUNITY): Payer: Self-pay

## 2017-05-01 DIAGNOSIS — I2510 Atherosclerotic heart disease of native coronary artery without angina pectoris: Secondary | ICD-10-CM | POA: Diagnosis not present

## 2017-05-01 DIAGNOSIS — I482 Chronic atrial fibrillation: Secondary | ICD-10-CM | POA: Diagnosis not present

## 2017-05-01 DIAGNOSIS — I130 Hypertensive heart and chronic kidney disease with heart failure and stage 1 through stage 4 chronic kidney disease, or unspecified chronic kidney disease: Secondary | ICD-10-CM | POA: Diagnosis not present

## 2017-05-01 DIAGNOSIS — I5023 Acute on chronic systolic (congestive) heart failure: Secondary | ICD-10-CM | POA: Diagnosis not present

## 2017-05-01 DIAGNOSIS — N183 Chronic kidney disease, stage 3 (moderate): Secondary | ICD-10-CM | POA: Diagnosis not present

## 2017-05-01 DIAGNOSIS — E1122 Type 2 diabetes mellitus with diabetic chronic kidney disease: Secondary | ICD-10-CM | POA: Diagnosis not present

## 2017-05-01 NOTE — Telephone Encounter (Signed)
Call placed to patient #[phone], regarding his appointment and legal aid. Spoke with patient and he confirmed his appointment and also confirmed that he will be needing transportation. Informed patient that I'll be arranging that and to be ready between 1 and 1:30pm.   Also, informed patient that Legal Aid has been trying to reach him but they have had no luck. Patient is now aware and stated that he will answer their call.   Patient requested to speak with Erskine Squibb (case manager). Informed patient that she wasn't in the office today but will be here tomorrow and that I would notify her.

## 2017-05-01 NOTE — Progress Notes (Signed)
Paramedicine Encounter    Patient ID: Jacob [name], male    DOB: 08/19/1955, 62 y.o.   MRN: [id number]    Patient Care Team: Hoy Register, MD as PCP - General (Family Medicine) Clarisa Schools, RN as Registered Nurse Jena Gauss, Gerrit Friends, MD as Consulting Physician (Gastroenterology) Pleasant, Dennard Schaumann, RN as [facility] Care Management  Patient Active Problem List   Diagnosis Date Noted  . Exertional angina (HCC)   . Chronic systolic heart failure (HCC) 12/17/2016  . Hyperglycemia 12/02/2016  . Osteoarthritis of right knee 12/02/2016  . Congestive heart disease (HCC) 01/01/2016  . Diabetic neuropathy (HCC) 11/28/2015  . Ascites 11/09/2015  . CHF (congestive heart failure) (HCC) 11/09/2015  . Insomnia 04/18/2015  . Claudication of right lower extremity (HCC) 04/07/2015  . Cardiomyopathy, ischemic 04/07/2015  . Chronic anticoagulation 03/30/2015  . Cardiorenal syndrome with renal failure   . SOB (shortness of breath)   . Morbid obesity due to excess calories (HCC)   . Helicobacter pylori ab+ 12/12/2014  . Calf pain   . Duodenal ulcer with hemorrhage   . Hemorrhagic shock (HCC)   . Cardiogenic shock (HCC)   . Diabetes mellitus (HCC)   . Coronary artery disease involving native coronary artery of native heart without angina pectoris   . Atrial fibrillation with rapid ventricular response (HCC) 10/29/2014  . Chest pain   . Atherosclerosis of native arteries of extremity with intermittent claudication (HCC) 09/06/2014  . Atrial fibrillation with controlled ventricular response (HCC) 08/03/2014  . Bacteremia   . DM (diabetes mellitus), type 2 with peripheral vascular complications (HCC) 08/01/2014  . Anemia of chronic disease 08/01/2014  . PVD (peripheral vascular disease) (HCC) 10/29/2013  . Tobacco abuse 05/13/2013  . AF (atrial fibrillation) (HCC) 02/11/2013  . ICD - in place- BS May 2014 [facility] 02/11/2013  . Microcytic anemia 01/11/2013  . CAD- s/p CABG July  2014 [facility] 12/04/2011  . Noncompliance 12/01/2011  . Essential hypertension 10/30/2006    Current Outpatient Medications:  .  allopurinol (ZYLOPRIM) 100 MG tablet, Take 2 tablets (200 mg total) by mouth daily., Disp: 60 tablet, Rfl: 5 .  atorvastatin (LIPITOR) 40 MG tablet, Take 1 tablet (40 mg total) by mouth daily at 6 PM., Disp: 30 tablet, Rfl: 5 .  digoxin (LANOXIN) 0.125 MG tablet, Take 0.5 tablets (0.0625 mg total) by mouth daily., Disp: 14 tablet, Rfl: 3 .  gabapentin (NEURONTIN) 300 MG capsule, Take 1 capsule (300 mg total) by mouth daily., Disp: , Rfl:  .  glipiZIDE (GLUCOTROL) 10 MG tablet, Take 1 tablet (10 mg total) by mouth 2 (two) times daily before a meal., Disp: 60 tablet, Rfl: 5 .  hydrALAZINE (APRESOLINE) 25 MG tablet, take 1 TABLET BY MOUTH THREE TIMES DAILY every morning, noon,evening, Disp: 90 tablet, Rfl: 3 .  isosorbide mononitrate (IMDUR) 30 MG 24 hr tablet, Take 1 tablet (30 mg total) by mouth daily., Disp: 28 tablet, Rfl: 3 .  torsemide (DEMADEX) 20 MG tablet, Take 4 tablets (80 mg total) by mouth 2 (two) times daily., Disp: 240 tablet, Rfl: 3 .  ACCU-CHEK SOFTCLIX LANCETS lancets, Use for once daily testing of blood sugar, Disp: 100 each, Rfl: 12 .  albuterol (PROVENTIL HFA;VENTOLIN HFA) 108 (90 Base) MCG/ACT inhaler, Inhale 2 puffs into the lungs every 6 (six) hours as needed for wheezing or shortness of breath., Disp: 1 Inhaler, Rfl: 2 .  albuterol (PROVENTIL) (2.5 MG/3ML) 0.083% nebulizer solution, Take 3 mLs (2.5 mg total) by  nebulization every 6 (six) hours as needed for wheezing or shortness of breath., Disp: 150 mL, Rfl: 1 .  Blood Glucose Monitoring Suppl (ACCU-CHEK AVIVA) device, Use as instructed1 times daily before meals, Disp: 1 each, Rfl: 0 .  diclofenac sodium (VOLTAREN) 1 % GEL, Apply 4 g topically 4 (four) times daily. 100/16=7 (Patient taking differently: Apply 4 g topically 4 (four) times daily. AS NEEDED FOR PAIN), Disp: 100 g, Rfl: 1 .   glucose blood (ACCU-CHEK AVIVA) test strip, Use as instructed for 1 times daily testing of blood sugar, Disp: 100 each, Rfl: 12 .  Insulin Glargine (LANTUS) 100 UNIT/ML Solostar Pen, Inject 30 Units into the skin daily at 10 pm., Disp: 4 pen, Rfl: 5 .  Insulin Pen Needle 31G X 5 MM MISC, Use as directed for once daily insulin injection, Disp: 100 each, Rfl: 2 .  Lancet Devices (ACCU-CHEK SOFTCLIX) lancets, Use as instructed for once daily testing of blood sugar, Disp: 1 each, Rfl: 0 .  metolazone (ZAROXOLYN) 2.5 MG tablet, Take 1 tablet once as directed by CHF clinic., Disp: 3 tablet, Rfl: 0 .  rivaroxaban (XARELTO) 20 MG TABS tablet, take one 20mg  tablet daily by mouth (Patient taking differently: Take 20 mg by mouth daily with supper. ), Disp: 28 tablet, Rfl: 2 Allergies  Allergen Reactions  . Pork-Derived Products Other (See Comments)    Religious preference     Social History   Socioeconomic History  . Marital status: Divorced    Spouse name: Not on file  . Number of children: 3  . Years of education: Not on file  . Highest education level: Not on file  Social Needs  . Financial resource strain: Not on file  . Food insecurity - worry: Not on file  . Food insecurity - inability: Not on file  . Transportation needs - medical: Not on file  . Transportation needs - non-medical: Not on file  Occupational History  . Occupation: disabled  Tobacco Use  . Smoking status: Current Every Day Smoker    Packs/day: 0.50    Years: 40.00    Pack years: 20.00    Types: Cigarettes  . Smokeless tobacco: Never Used  Substance and Sexual Activity  . Alcohol use: No    Alcohol/week: 0.0 oz  . Drug use: No  . Sexual activity: No  Other Topics Concern  . Not on file  Social History Narrative   Has an apartment with a roommate. He was living on the streets in 2013-01-23.  He reports that his father died in [place] in 2013-01-23.  He is divorced.  He is no longer estranged from his son, but  still from his daughter who lives locally.  Neither of his parents, nor any siblings have any history of CAD.    Physical Exam  Pulmonary/Chest: No respiratory distress.  Abdominal: He exhibits no distension. There is no tenderness.  Musculoskeletal: He exhibits no edema.  Skin: Skin is warm and dry. He is not diaphoretic.        Future Appointments  Date Time Provider Department Center  05/02/2017  2:00 PM Hoy Register, MD [facility] None  05/16/2017  3:00 PM Hoy Register, MD [facility] None  05/28/2017  2:30 PM [facility] PA/NP [facility] None  06/02/2017  7:25 AM [facility] DEVICE REMOTES [facility]    ATF pt CAO x4 sitting on the side of his bed smoking a cigarette.  Pt stated that the [facility] home nurse just left.  He stated  that she will be coming twice weekly.  Pt also has home aide coming over to help him clean.  Pt denies chest pain, dizziness and sob.  Pt has pill packs around his apartment, so i'm unsure how much he has been taking.  Pt stated that he still have issues sleeping but he thinks that its due to him watching tv.  I took his most recent med list and called [facility] to verify the rx prior to them doing his pill pack.  Pill packs should be ready for pick up tomorrow  BP 128/90 (BP Location: Right Arm, Patient Position: Sitting, Cuff Size: Normal)   Pulse 94   Resp 16   Wt 243 lb (110.2 kg)   SpO2 98%   BMI 40.44 kg/m   Weight yesterday-didn't weigh     Jacon Whetzel, EMT Paramedic 05/01/2017    ACTION: Home visit completed Next visit planned for 2/27

## 2017-05-02 ENCOUNTER — Ambulatory Visit: Payer: Medicare Other | Attending: Family Medicine | Admitting: Family Medicine

## 2017-05-02 ENCOUNTER — Telehealth: Payer: Self-pay

## 2017-05-02 VITALS — BP 120/70 | HR 107 | Temp 98.0°F | Resp 16 | Wt 248.0 lb

## 2017-05-02 DIAGNOSIS — I110 Hypertensive heart disease with heart failure: Secondary | ICD-10-CM | POA: Insufficient documentation

## 2017-05-02 DIAGNOSIS — I739 Peripheral vascular disease, unspecified: Secondary | ICD-10-CM

## 2017-05-02 DIAGNOSIS — Z7901 Long term (current) use of anticoagulants: Secondary | ICD-10-CM | POA: Insufficient documentation

## 2017-05-02 DIAGNOSIS — Z794 Long term (current) use of insulin: Secondary | ICD-10-CM | POA: Insufficient documentation

## 2017-05-02 DIAGNOSIS — E7800 Pure hypercholesterolemia, unspecified: Secondary | ICD-10-CM | POA: Diagnosis not present

## 2017-05-02 DIAGNOSIS — Z79899 Other long term (current) drug therapy: Secondary | ICD-10-CM | POA: Insufficient documentation

## 2017-05-02 DIAGNOSIS — I2510 Atherosclerotic heart disease of native coronary artery without angina pectoris: Secondary | ICD-10-CM

## 2017-05-02 DIAGNOSIS — I481 Persistent atrial fibrillation: Secondary | ICD-10-CM | POA: Insufficient documentation

## 2017-05-02 DIAGNOSIS — G4709 Other insomnia: Secondary | ICD-10-CM | POA: Diagnosis not present

## 2017-05-02 DIAGNOSIS — F17200 Nicotine dependence, unspecified, uncomplicated: Secondary | ICD-10-CM | POA: Insufficient documentation

## 2017-05-02 DIAGNOSIS — I4819 Other persistent atrial fibrillation: Secondary | ICD-10-CM

## 2017-05-02 DIAGNOSIS — Z955 Presence of coronary angioplasty implant and graft: Secondary | ICD-10-CM | POA: Diagnosis not present

## 2017-05-02 DIAGNOSIS — H539 Unspecified visual disturbance: Secondary | ICD-10-CM

## 2017-05-02 DIAGNOSIS — I5022 Chronic systolic (congestive) heart failure: Secondary | ICD-10-CM

## 2017-05-02 DIAGNOSIS — Z951 Presence of aortocoronary bypass graft: Secondary | ICD-10-CM | POA: Diagnosis not present

## 2017-05-02 DIAGNOSIS — E1151 Type 2 diabetes mellitus with diabetic peripheral angiopathy without gangrene: Secondary | ICD-10-CM | POA: Diagnosis not present

## 2017-05-02 DIAGNOSIS — E1149 Type 2 diabetes mellitus with other diabetic neurological complication: Secondary | ICD-10-CM

## 2017-05-02 DIAGNOSIS — Z95810 Presence of automatic (implantable) cardiac defibrillator: Secondary | ICD-10-CM | POA: Insufficient documentation

## 2017-05-02 DIAGNOSIS — I252 Old myocardial infarction: Secondary | ICD-10-CM | POA: Diagnosis not present

## 2017-05-02 DIAGNOSIS — E119 Type 2 diabetes mellitus without complications: Secondary | ICD-10-CM | POA: Diagnosis present

## 2017-05-02 LAB — POCT GLYCOSYLATED HEMOGLOBIN (HGB A1C): Hemoglobin A1C: 10

## 2017-05-02 LAB — GLUCOSE, POCT (MANUAL RESULT ENTRY): POC Glucose: 422 mg/dl — AB (ref 70–99)

## 2017-05-02 MED ORDER — INSULIN GLARGINE 100 UNIT/ML SOLOSTAR PEN
38.0000 [IU] | PEN_INJECTOR | Freq: Every day | SUBCUTANEOUS | 5 refills | Status: DC
Start: 2017-05-02 — End: 2017-07-30

## 2017-05-02 MED ORDER — TRAZODONE HCL 100 MG PO TABS: 100.0000 mg | ORAL_TABLET | Freq: Every evening | ORAL | 3 refills | Status: DC | PRN

## 2017-05-02 NOTE — Progress Notes (Signed)
Subjective:  Patient ID: Jacob [name], male    DOB: [birth year]  Age: 62 y.o. 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   Due to worsening dyspnea and chest pain he was admitted for right and left cardiac cath which revealed findings below: Conclusion     Ost RCA to Prox RCA lesion is 100% stenosed.  Ost Cx to Prox Cx lesion is 99% stenosed.  Ost LAD to Prox LAD lesion is 40% stenosed.  Prox LAD to Mid LAD lesion is 40% stenosed.  Dist LAD lesion is 70% stenosed.   Three vessel CAD thought to be stable - continue medical management.  High resolution CT performed during hospitalization revealed: IMPRESSION: 1. Mild upper/mid lung zone predominant subpleural densities. Fibrotic interstitial lung disease such as fibrotic interstitial pneumonitis cannot be excluded. 2.  Aortic atherosclerosis (ICD10-170.0). 3. Enlarged pulmonary arteries, indicative of pulmonary arterial hypertension. 4. Left renal stones  Amiodarone held due to concern for interstitial lung disease. Other medication changes included discontinuation of Spironolactone due to hyperkalemia.   Today he complains of insomnia and would like a sleep aid. He also complains of intermittent claudication pains and of note continues to smoke. He has not been to see his vascular surgeon in a while. With regards to his Diabetes he endorses compliance with his Lantus and Glipizide but compliance with a Diabetic diet cannot be ascertained. Denies  dyspnea, chest pains, pedal edema at this time.  Past Medical History:  Diagnosis Date  . Atrial fibrillation (HCC)    RVR 10/2014  . Automatic implantable cardioverter-defibrillator in situ   . CAD (coronary artery disease) Sept 2013   s/p cardiac cath showing occlusion of small RCA with collaterals  . CHF (congestive heart failure) (HCC)    20 to 25 % EF and RV dysfunction by 07/2014 echo   . Chronic anticoagulation    on xarelto.   . High cholesterol   . Hypertension   . Myocardial infarction (HCC) 2014  . Noncompliance    homelessness contributing.   . Peripheral arterial disease (HCC)   . Type II diabetes mellitus (HCC)     Past Surgical History:  Procedure Laterality Date  . CARDIAC CATHETERIZATION  09/2012  . CORONARY ANGIOPLASTY WITH STENT PLACEMENT  11/2011   "1"  . CORONARY ARTERY BYPASS GRAFT  09/2012   2 vessels per patient [name]   . ESOPHAGOGASTRODUODENOSCOPY N/A 11/28/2014   Procedure: ESOPHAGOGASTRODUODENOSCOPY (EGD);  Surgeon: Beverley Fiedler, MD;  Location: [facility] ENDOSCOPY;  Service: Endoscopy;  Laterality: N/A;  . ILIAC ARTERY STENT Right 08/30/2013  . IMPLANTABLE CARDIOVERTER DEFIBRILLATOR IMPLANT     [place] in 07/2012; [employer]  . LEFT AND RIGHT HEART CATHETERIZATION WITH CORONARY ANGIOGRAM N/A 12/02/2011   Procedure: LEFT AND RIGHT HEART CATHETERIZATION WITH CORONARY ANGIOGRAM;  Surgeon: Kathleene Hazel, MD;  Location: [facility] CATH LAB;  Service: Cardiovascular;  Laterality: N/A;  . LOWER EXTREMITY ANGIOGRAM N/A 08/30/2013   Procedure: LOWER EXTREMITY ANGIOGRAM;  Surgeon: Runell Gess, MD;  Location: [facility] CATH LAB;  Service: Cardiovascular;  Laterality: N/A;  .  LOWER EXTREMITY ANGIOGRAM N/A 12/02/2013   Procedure: LOWER EXTREMITY ANGIOGRAM;  Surgeon: Runell Gess, MD;  Location: [facility] CATH LAB;  Service: Cardiovascular;  Laterality: N/A;  . RIGHT/LEFT HEART CATH AND CORONARY/GRAFT ANGIOGRAPHY N/A 04/23/2017   Procedure: RIGHT/LEFT HEART CATH AND  CORONARY/GRAFT ANGIOGRAPHY;  Surgeon: Dolores Patty, MD;  Location: [facility] INVASIVE CV LAB;  Service: Cardiovascular;  Laterality: N/A;    Allergies  Allergen Reactions  . Pork-Derived Products Other (See Comments)    Religious preference     Outpatient Medications Prior to Visit  Medication Sig Dispense Refill  . ACCU-CHEK SOFTCLIX LANCETS lancets Use for once daily testing of blood sugar 100 each 12  . albuterol (PROVENTIL HFA;VENTOLIN HFA) 108 (90 Base) MCG/ACT inhaler Inhale 2 puffs into the lungs every 6 (six) hours as needed for wheezing or shortness of breath. 1 Inhaler 2  . albuterol (PROVENTIL) (2.5 MG/3ML) 0.083% nebulizer solution Take 3 mLs (2.5 mg total) by nebulization every 6 (six) hours as needed for wheezing or shortness of breath. 150 mL 1  . allopurinol (ZYLOPRIM) 100 MG tablet Take 2 tablets (200 mg total) by mouth daily. 60 tablet 5  . atorvastatin (LIPITOR) 40 MG tablet Take 1 tablet (40 mg total) by mouth daily at 6 PM. 30 tablet 5  . Blood Glucose Monitoring Suppl (ACCU-CHEK AVIVA) device Use as instructed1 times daily before meals 1 each 0  . diclofenac sodium (VOLTAREN) 1 % GEL Apply 4 g topically 4 (four) times daily. 100/16=7 (Patient taking differently: Apply 4 g topically 4 (four) times daily. AS NEEDED FOR PAIN) 100 g 1  . digoxin (LANOXIN) 0.125 MG tablet Take 0.5 tablets (0.0625 mg total) by mouth daily. 14 tablet 3  . gabapentin (NEURONTIN) 300 MG capsule Take 1 capsule (300 mg total) by mouth daily.    Marland Kitchen glipiZIDE (GLUCOTROL) 10 MG tablet Take 1 tablet (10 mg total) by mouth 2 (two) times daily before a meal. 60 tablet 5  . glucose blood (ACCU-CHEK AVIVA) test strip Use as instructed for 1 times daily testing of blood sugar 100 each 12  . hydrALAZINE (APRESOLINE) 25 MG tablet take 1 TABLET BY MOUTH THREE TIMES DAILY every morning, noon,evening 90 tablet 3  . Insulin Pen Needle 31G X 5 MM MISC Use as directed for once daily insulin injection 100 each 2  .  isosorbide mononitrate (IMDUR) 30 MG 24 hr tablet Take 1 tablet (30 mg total) by mouth daily. 28 tablet 3  . Lancet Devices (ACCU-CHEK SOFTCLIX) lancets Use as instructed for once daily testing of blood sugar 1 each 0  . metolazone (ZAROXOLYN) 2.5 MG tablet Take 1 tablet once as directed by CHF clinic. 3 tablet 0  . rivaroxaban (XARELTO) 20 MG TABS tablet take one 20mg  tablet daily by mouth (Patient taking differently: Take 20 mg by mouth daily with supper. ) 28 tablet 2  . torsemide (DEMADEX) 20 MG tablet Take 4 tablets (80 mg total) by mouth 2 (two) times daily. 240 tablet 3  . Insulin Glargine (LANTUS) 100 UNIT/ML Solostar Pen Inject 30 Units into the skin daily at 10 pm. 4 pen 5   No facility-administered medications prior to visit.     ROS Review of Systems  Constitutional: Negative for activity change and appetite change.  HENT: Negative for sinus pressure and sore throat.   Eyes: Negative for visual disturbance.  Respiratory: Negative for cough, chest tightness and shortness of breath.   Cardiovascular: Negative for chest pain and leg swelling.  Gastrointestinal: Negative for abdominal distention, abdominal pain, constipation and diarrhea.  Endocrine: Negative.   Genitourinary: Negative for dysuria.  Musculoskeletal:       See hpi  Skin: Negative for rash.  Allergic/Immunologic: Negative.   Neurological: Negative for weakness, light-headedness and numbness.  Psychiatric/Behavioral: Negative for dysphoric mood and suicidal ideas.    Objective:  BP 120/70   Pulse (!) 107   Temp 98 F (36.7 C) (Oral)   Resp 16   Wt 248 lb (112.5 kg)   SpO2 97%   BMI 41.27 kg/m   BP/Weight 05/02/2017 05/01/2017 04/30/2017  Systolic BP 120 128 124  Diastolic BP 70 90 84  Wt. (Lbs) 248 243 245  BMI 41.27 40.44 40.77    Wt Readings from Last 3 Encounters:  05/02/17 248 lb (112.5 kg)  05/01/17 243 lb (110.2 kg)  04/30/17 245 lb (111.1 kg)     Physical Exam  Constitutional: He is  oriented to person, place, and time. He appears well-developed and well-nourished.  Neck: No JVD present.  Cardiovascular: Normal rate and normal heart sounds. An irregularly irregular rhythm present.  No murmur heard. Unable to palpate DP pulses b/l  Pulmonary/Chest: Effort normal and breath sounds normal. He has no wheezes. He has no rales. He exhibits no tenderness.  Abdominal: Soft. Bowel sounds are normal. He exhibits no distension and no mass. There is no tenderness.  Musculoskeletal: Normal range of motion.  Neurological: He is alert and oriented to person, place, and time.  Skin: Skin is warm and dry.  Psychiatric: He has a normal mood and affect.     Lab Results  Component Value Date   HGBA1C 10.0 05/02/2017    Assessment & Plan:   1. DM (diabetes mellitus), type 2 with peripheral vascular complications (HCC) Uncontrolled with A1c of 10.0 Increase dose to Lantus to 38 units Encouraged to comply with a diabetic diet - POCT glycosylated hemoglobin (Hb A1C) - POCT glucose (manual entry) - Ambulatory referral to Ophthalmology - Insulin Glargine (LANTUS) 100 UNIT/ML Solostar Pen; Inject 38 Units into the skin daily at 10 pm.  Dispense: 4 pen; Refill: 5  2. PVD (peripheral vascular disease) (HCC) Intermittent claudication pain - bilateral PAD from in office ABI on 11/2016 Advised that ongoing smoking is not helping Currently on Xarelto - Ambulatory referral to Vascular Surgery  3. Other insomnia Sleep hygiene discussed - traZODone (DESYREL) 100 MG tablet; Take 1 tablet (100 mg total) by mouth at bedtime as needed for sleep.  Dispense: 30 tablet; Refill: 3  4. Vision abnormalities Yet to undergo annual eye exam - Ambulatory referral to Ophthalmology  5. Chronic systolic heart failure (HCC) EF 30-35 % from echo of 12/17/16 Continue Torsemide, Metolazone, Hydralazine, Isosorbide Daily weights, low sodium, heart healthy diet  6. Coronary artery disease involving native  coronary artery of native heart without angina pectoris S/p cardiac cath  7. Persistent atrial fibrillation (HCC) Rate control with Digoxin and anticoagulation with Xarelto Amiodarone discontinue during hospitalization due to concern for ILD on HRCT  8. Other diabetic neurological complication associated with type 2 diabetes mellitus (HCC) Continue Gabapentin - Insulin Glargine (LANTUS) 100 UNIT/ML Solostar Pen; Inject 38 Units into the skin daily at 10 pm.  Dispense: 4 pen; Refill: 5   Meds ordered this encounter  Medications  . traZODone (DESYREL) 100 MG tablet    Sig: Take 1 tablet (100 mg total) by mouth at bedtime as needed for sleep.    Dispense:  30 tablet  Refill:  3  . Insulin Glargine (LANTUS) 100 UNIT/ML Solostar Pen    Sig: Inject 38 Units into the skin daily at 10 pm.    Dispense:  4 pen    Refill:  5    Discontinue previous dose    Follow-up: Return in about 3 months (around 07/30/2017) for follow up of Diabetes mellitus.   Hoy Register MD

## 2017-05-02 NOTE — Patient Instructions (Signed)
Diabetes Mellitus and Nutrition When you have diabetes (diabetes mellitus), it is very important to have healthy eating habits because your blood sugar (glucose) levels are greatly affected by what you eat and drink. Eating healthy foods in the appropriate amounts, at about the same times every day, can help you:  Control your blood glucose.  Lower your risk of heart disease.  Improve your blood pressure.  Reach or maintain a healthy weight.  Every person with diabetes is different, and each person has different needs for a meal plan. Your health care provider may recommend that you work with a diet and nutrition specialist (dietitian) to make a meal plan that is best for you. Your meal plan may vary depending on factors such as:  The calories you need.  The medicines you take.  Your weight.  Your blood glucose, blood pressure, and cholesterol levels.  Your activity level.  Other health conditions you have, such as heart or kidney disease.  How do carbohydrates affect me? Carbohydrates affect your blood glucose level more than any other type of food. Eating carbohydrates naturally increases the amount of glucose in your blood. Carbohydrate counting is a method for keeping track of how many carbohydrates you eat. Counting carbohydrates is important to keep your blood glucose at a healthy level, especially if you use insulin or take certain oral diabetes medicines. It is important to know how many carbohydrates you can safely have in each meal. This is different for every person. Your dietitian can help you calculate how many carbohydrates you should have at each meal and for snack. Foods that contain carbohydrates include:  Bread, cereal, rice, pasta, and crackers.  Potatoes and corn.  Peas, beans, and lentils.  Milk and yogurt.  Fruit and juice.  Desserts, such as cakes, cookies, ice cream, and candy.  How does alcohol affect me? Alcohol can cause a sudden decrease in blood  glucose (hypoglycemia), especially if you use insulin or take certain oral diabetes medicines. Hypoglycemia can be a life-threatening condition. Symptoms of hypoglycemia (sleepiness, dizziness, and confusion) are similar to symptoms of having too much alcohol. If your health care provider says that alcohol is safe for you, follow these guidelines:  Limit alcohol intake to no more than 1 drink per day for nonpregnant women and 2 drinks per day for men. One drink equals 12 oz of beer, 5 oz of wine, or 1 oz of hard liquor.  Do not drink on an empty stomach.  Keep yourself hydrated with water, diet soda, or unsweetened iced tea.  Keep in mind that regular soda, juice, and other mixers may contain a lot of sugar and must be counted as carbohydrates.  What are tips for following this plan? Reading food labels  Start by checking the serving size on the label. The amount of calories, carbohydrates, fats, and other nutrients listed on the label are based on one serving of the food. Many foods contain more than one serving per package.  Check the total grams (g) of carbohydrates in one serving. You can calculate the number of servings of carbohydrates in one serving by dividing the total carbohydrates by 15. For example, if a food has 30 g of total carbohydrates, it would be equal to 2 servings of carbohydrates.  Check the number of grams (g) of saturated and trans fats in one serving. Choose foods that have low or no amount of these fats.  Check the number of milligrams (mg) of sodium in one serving. Most people   should limit total sodium intake to less than 2,300 mg per day.  Always check the nutrition information of foods labeled as "low-fat" or "nonfat". These foods may be higher in added sugar or refined carbohydrates and should be avoided.  Talk to your dietitian to identify your daily goals for nutrients listed on the label. Shopping  Avoid buying canned, premade, or processed foods. These  foods tend to be high in fat, sodium, and added sugar.  Shop around the outside edge of the grocery store. This includes fresh fruits and vegetables, bulk grains, fresh meats, and fresh dairy. Cooking  Use low-heat cooking methods, such as baking, instead of high-heat cooking methods like deep frying.  Cook using healthy oils, such as olive, canola, or sunflower oil.  Avoid cooking with butter, cream, or high-fat meats. Meal planning  Eat meals and snacks regularly, preferably at the same times every day. Avoid going long periods of time without eating.  Eat foods high in fiber, such as fresh fruits, vegetables, beans, and whole grains. Talk to your dietitian about how many servings of carbohydrates you can eat at each meal.  Eat 4-6 ounces of lean protein each day, such as lean meat, chicken, fish, eggs, or tofu. 1 ounce is equal to 1 ounce of meat, chicken, or fish, 1 egg, or 1/4 cup of tofu.  Eat some foods each day that contain healthy fats, such as avocado, nuts, seeds, and fish. Lifestyle   Check your blood glucose regularly.  Exercise at least 30 minutes 5 or more days each week, or as told by your health care provider.  Take medicines as told by your health care provider.  Do not use any products that contain nicotine or tobacco, such as cigarettes and e-cigarettes. If you need help quitting, ask your health care provider.  Work with a counselor or diabetes educator to identify strategies to manage stress and any emotional and social challenges. What are some questions to ask my health care provider?  Do I need to meet with a diabetes educator?  Do I need to meet with a dietitian?  What number can I call if I have questions?  When are the best times to check my blood glucose? Where to find more information:  American Diabetes Association: diabetes.org/food-and-fitness/food  Academy of Nutrition and Dietetics:  www.eatright.org/resources/health/diseases-and-conditions/diabetes  National Institute of Diabetes and Digestive and Kidney Diseases (NIH): www.niddk.nih.gov/health-information/diabetes/overview/diet-eating-physical-activity Summary  A healthy meal plan will help you control your blood glucose and maintain a healthy lifestyle.  Working with a diet and nutrition specialist (dietitian) can help you make a meal plan that is best for you.  Keep in mind that carbohydrates and alcohol have immediate effects on your blood glucose levels. It is important to count carbohydrates and to use alcohol carefully. This information is not intended to replace advice given to you by your health care provider. Make sure you discuss any questions you have with your health care provider. Document Released: 12/06/2004 Document Revised: 04/15/2016 Document Reviewed: 04/15/2016 Elsevier Interactive Patient Education  2018 Elsevier Inc.  

## 2017-05-02 NOTE — Telephone Encounter (Signed)
Met with the patient when he was in the clinic today.  He explained that he received a call from [employer] [facility] and has not been able to return the call.  Abelino Derrick, paralegal with [employer] [facility] was in the office at the time of the visit and was able to meet with the patient.  She stated that he explained his status about his disability and she will need to call social security with him present. She will plan to meet with him at [facility] on 05/14/17 in the morning.

## 2017-05-04 ENCOUNTER — Encounter: Payer: Self-pay | Admitting: Family Medicine

## 2017-05-08 ENCOUNTER — Telehealth: Payer: Self-pay | Admitting: Family Medicine

## 2017-05-08 DIAGNOSIS — E1122 Type 2 diabetes mellitus with diabetic chronic kidney disease: Secondary | ICD-10-CM | POA: Diagnosis not present

## 2017-05-08 DIAGNOSIS — I482 Chronic atrial fibrillation: Secondary | ICD-10-CM | POA: Diagnosis not present

## 2017-05-08 DIAGNOSIS — N183 Chronic kidney disease, stage 3 (moderate): Secondary | ICD-10-CM | POA: Diagnosis not present

## 2017-05-08 DIAGNOSIS — I130 Hypertensive heart and chronic kidney disease with heart failure and stage 1 through stage 4 chronic kidney disease, or unspecified chronic kidney disease: Secondary | ICD-10-CM | POA: Diagnosis not present

## 2017-05-08 DIAGNOSIS — I5023 Acute on chronic systolic (congestive) heart failure: Secondary | ICD-10-CM | POA: Diagnosis not present

## 2017-05-08 DIAGNOSIS — I2510 Atherosclerotic heart disease of native coronary artery without angina pectoris: Secondary | ICD-10-CM | POA: Diagnosis not present

## 2017-05-08 DIAGNOSIS — E1159 Type 2 diabetes mellitus with other circulatory complications: Secondary | ICD-10-CM

## 2017-05-08 MED ORDER — ACCU-CHEK AVIVA DEVI: 0 refills | Status: DC

## 2017-05-08 NOTE — Telephone Encounter (Signed)
Refilled

## 2017-05-08 NOTE — Telephone Encounter (Signed)
Jacob [name] from [facility] called to request a refill on Pt's  -Glucometer Send to  -[facility]- [name] - [place], [place] - [address] Please follow up

## 2017-05-08 NOTE — Addendum Note (Signed)
Addended by: Santa Lighter on: 05/08/2017 12:06 PM   Modules accepted: Orders

## 2017-05-09 ENCOUNTER — Other Ambulatory Visit: Payer: Self-pay | Admitting: Family Medicine

## 2017-05-12 ENCOUNTER — Other Ambulatory Visit (HOSPITAL_COMMUNITY): Payer: Self-pay

## 2017-05-12 DIAGNOSIS — I2510 Atherosclerotic heart disease of native coronary artery without angina pectoris: Secondary | ICD-10-CM | POA: Diagnosis not present

## 2017-05-12 DIAGNOSIS — I5023 Acute on chronic systolic (congestive) heart failure: Secondary | ICD-10-CM | POA: Diagnosis not present

## 2017-05-12 DIAGNOSIS — I130 Hypertensive heart and chronic kidney disease with heart failure and stage 1 through stage 4 chronic kidney disease, or unspecified chronic kidney disease: Secondary | ICD-10-CM | POA: Diagnosis not present

## 2017-05-12 DIAGNOSIS — N183 Chronic kidney disease, stage 3 (moderate): Secondary | ICD-10-CM | POA: Diagnosis not present

## 2017-05-12 DIAGNOSIS — E1122 Type 2 diabetes mellitus with diabetic chronic kidney disease: Secondary | ICD-10-CM | POA: Diagnosis not present

## 2017-05-12 DIAGNOSIS — I482 Chronic atrial fibrillation: Secondary | ICD-10-CM | POA: Diagnosis not present

## 2017-05-12 NOTE — Progress Notes (Signed)
chantel from the heart failure clinic advised me on Friday that she can not get into contact with pt.  She has contacted him several times about his meds but he either doesn't answer his phone or isn't  Close to his meds.  I stopped by Friday 05/09/17 afternoon to check with him after he didn't answer his phone with me also but there was no answer at the door.    Today he is at home.  He stated that he has taken his morning meds today out of the previous pill packs.  The new pill packs has already been delivered and I verified the meds.  He is no longer taking potassium or spirolactone.  Metolazone came in a rx bottle and I advised him not to take it unless his physician advises him otherwise.  He stated that physical therapy begins tomorrow.

## 2017-05-13 ENCOUNTER — Telehealth: Payer: Self-pay | Admitting: Family Medicine

## 2017-05-13 NOTE — Telephone Encounter (Signed)
Call placed to patient #[phone], regarding his appointment with [facility]  tomorrow 2/20 at 10am. No answer. Left patient a message asking him to return my call at [phone].

## 2017-05-14 ENCOUNTER — Telehealth: Payer: Self-pay

## 2017-05-14 DIAGNOSIS — I130 Hypertensive heart and chronic kidney disease with heart failure and stage 1 through stage 4 chronic kidney disease, or unspecified chronic kidney disease: Secondary | ICD-10-CM | POA: Diagnosis not present

## 2017-05-14 DIAGNOSIS — I5023 Acute on chronic systolic (congestive) heart failure: Secondary | ICD-10-CM | POA: Diagnosis not present

## 2017-05-14 DIAGNOSIS — E1122 Type 2 diabetes mellitus with diabetic chronic kidney disease: Secondary | ICD-10-CM | POA: Diagnosis not present

## 2017-05-14 DIAGNOSIS — N183 Chronic kidney disease, stage 3 (moderate): Secondary | ICD-10-CM | POA: Diagnosis not present

## 2017-05-14 DIAGNOSIS — I482 Chronic atrial fibrillation: Secondary | ICD-10-CM | POA: Diagnosis not present

## 2017-05-14 DIAGNOSIS — I2510 Atherosclerotic heart disease of native coronary artery without angina pectoris: Secondary | ICD-10-CM | POA: Diagnosis not present

## 2017-05-14 NOTE — Telephone Encounter (Signed)
The patient came to the clinic today to meet with Ranee Gosselin, [employer] [facility]. She contacted the social security office with him and it was determined that he is owed $4000.00 in back pay from social security. He just needs to go to the social security office to request the money. She stated that he also needs to obtain a denial notice from [employer].  She provided him with written instructions to go to the social security office and what to request.  She explained that after she receives the denial notice from [employer], she will be able to determine if he is eligible for any additional money. He currently has $134/month deducted from his check to pay his medicare premium.   He mentioned that he lost his food stamps card. Instructed him to go to DSS to explain to them the issue and request a new card.   Update provided to Lasandra Beech, LCSW

## 2017-05-16 ENCOUNTER — Ambulatory Visit: Payer: Medicare Other | Admitting: Family Medicine

## 2017-05-19 ENCOUNTER — Other Ambulatory Visit: Payer: Self-pay | Admitting: Family Medicine

## 2017-05-19 ENCOUNTER — Other Ambulatory Visit (HOSPITAL_COMMUNITY): Payer: Self-pay | Admitting: Internal Medicine

## 2017-05-19 DIAGNOSIS — N183 Chronic kidney disease, stage 3 (moderate): Secondary | ICD-10-CM | POA: Diagnosis not present

## 2017-05-19 DIAGNOSIS — E1122 Type 2 diabetes mellitus with diabetic chronic kidney disease: Secondary | ICD-10-CM | POA: Diagnosis not present

## 2017-05-19 DIAGNOSIS — I2510 Atherosclerotic heart disease of native coronary artery without angina pectoris: Secondary | ICD-10-CM | POA: Diagnosis not present

## 2017-05-19 DIAGNOSIS — I5023 Acute on chronic systolic (congestive) heart failure: Secondary | ICD-10-CM | POA: Diagnosis not present

## 2017-05-19 DIAGNOSIS — I130 Hypertensive heart and chronic kidney disease with heart failure and stage 1 through stage 4 chronic kidney disease, or unspecified chronic kidney disease: Secondary | ICD-10-CM | POA: Diagnosis not present

## 2017-05-19 DIAGNOSIS — I482 Chronic atrial fibrillation, unspecified: Secondary | ICD-10-CM

## 2017-05-19 DIAGNOSIS — I25811 Atherosclerosis of native coronary artery of transplanted heart without angina pectoris: Secondary | ICD-10-CM

## 2017-05-21 DIAGNOSIS — I482 Chronic atrial fibrillation: Secondary | ICD-10-CM | POA: Diagnosis not present

## 2017-05-21 DIAGNOSIS — I2510 Atherosclerotic heart disease of native coronary artery without angina pectoris: Secondary | ICD-10-CM | POA: Diagnosis not present

## 2017-05-21 DIAGNOSIS — N183 Chronic kidney disease, stage 3 (moderate): Secondary | ICD-10-CM | POA: Diagnosis not present

## 2017-05-21 DIAGNOSIS — I130 Hypertensive heart and chronic kidney disease with heart failure and stage 1 through stage 4 chronic kidney disease, or unspecified chronic kidney disease: Secondary | ICD-10-CM | POA: Diagnosis not present

## 2017-05-21 DIAGNOSIS — I5023 Acute on chronic systolic (congestive) heart failure: Secondary | ICD-10-CM | POA: Diagnosis not present

## 2017-05-21 DIAGNOSIS — E1122 Type 2 diabetes mellitus with diabetic chronic kidney disease: Secondary | ICD-10-CM | POA: Diagnosis not present

## 2017-05-23 ENCOUNTER — Other Ambulatory Visit (HOSPITAL_COMMUNITY): Payer: Self-pay

## 2017-05-23 DIAGNOSIS — I130 Hypertensive heart and chronic kidney disease with heart failure and stage 1 through stage 4 chronic kidney disease, or unspecified chronic kidney disease: Secondary | ICD-10-CM | POA: Diagnosis not present

## 2017-05-23 DIAGNOSIS — I2510 Atherosclerotic heart disease of native coronary artery without angina pectoris: Secondary | ICD-10-CM | POA: Diagnosis not present

## 2017-05-23 DIAGNOSIS — I5023 Acute on chronic systolic (congestive) heart failure: Secondary | ICD-10-CM | POA: Diagnosis not present

## 2017-05-23 DIAGNOSIS — E1122 Type 2 diabetes mellitus with diabetic chronic kidney disease: Secondary | ICD-10-CM | POA: Diagnosis not present

## 2017-05-23 DIAGNOSIS — I482 Chronic atrial fibrillation: Secondary | ICD-10-CM | POA: Diagnosis not present

## 2017-05-23 DIAGNOSIS — N183 Chronic kidney disease, stage 3 (moderate): Secondary | ICD-10-CM | POA: Diagnosis not present

## 2017-05-23 NOTE — Progress Notes (Signed)
Paramedicine Encounter    Patient ID: Jacob [name], male    DOB: 1955/11/02, 62 y.o.   MRN: [id number]    Patient Care Team: Hoy Register, MD as PCP - General (Family Medicine) Clarisa Schools, RN as Registered Nurse Jena Gauss, Gerrit Friends, MD as Consulting Physician (Gastroenterology) Pleasant, Dennard Schaumann, RN as [facility] Care Management  Patient Active Problem List   Diagnosis Date Noted  . Exertional angina (HCC)   . Chronic systolic heart failure (HCC) 12/17/2016  . Hyperglycemia 12/02/2016  . Osteoarthritis of right knee 12/02/2016  . Congestive heart disease (HCC) 01/01/2016  . Diabetic neuropathy (HCC) 11/28/2015  . Ascites 11/09/2015  . CHF (congestive heart failure) (HCC) 11/09/2015  . Insomnia 04/18/2015  . Claudication of right lower extremity (HCC) 04/07/2015  . Cardiomyopathy, ischemic 04/07/2015  . Chronic anticoagulation 03/30/2015  . Cardiorenal syndrome with renal failure   . SOB (shortness of breath)   . Morbid obesity due to excess calories (HCC)   . Helicobacter pylori ab+ 12/12/2014  . Calf pain   . Duodenal ulcer with hemorrhage   . Hemorrhagic shock (HCC)   . Cardiogenic shock (HCC)   . Diabetes mellitus (HCC)   . Coronary artery disease involving native coronary artery of native heart without angina pectoris   . Atrial fibrillation with rapid ventricular response (HCC) 10/29/2014  . Chest pain   . Atherosclerosis of native arteries of extremity with intermittent claudication (HCC) 09/06/2014  . Atrial fibrillation with controlled ventricular response (HCC) 08/03/2014  . Bacteremia   . DM (diabetes mellitus), type 2 with peripheral vascular complications (HCC) 08/01/2014  . Anemia of chronic disease 08/01/2014  . PVD (peripheral vascular disease) (HCC) 10/29/2013  . Tobacco abuse 05/13/2013  . AF (atrial fibrillation) (HCC) 02/11/2013  . ICD - in place- BS May 2014 [facility] 02/11/2013  . Microcytic anemia 01/11/2013  . CAD- s/p CABG July  2014 [facility] 12/04/2011  . Noncompliance 12/01/2011  . Essential hypertension 10/30/2006    Current Outpatient Medications:  .  ACCU-CHEK SOFTCLIX LANCETS lancets, Use for once daily testing of blood sugar, Disp: 100 each, Rfl: 12 .  albuterol (PROVENTIL HFA;VENTOLIN HFA) 108 (90 Base) MCG/ACT inhaler, Inhale 2 puffs into the lungs every 6 (six) hours as needed for wheezing or shortness of breath., Disp: 1 Inhaler, Rfl: 2 .  albuterol (PROVENTIL) (2.5 MG/3ML) 0.083% nebulizer solution, Take 3 mLs (2.5 mg total) by nebulization every 6 (six) hours as needed for wheezing or shortness of breath., Disp: 150 mL, Rfl: 1 .  allopurinol (ZYLOPRIM) 100 MG tablet, Take 2 tablets (200 mg total) by mouth daily., Disp: 60 tablet, Rfl: 5 .  atorvastatin (LIPITOR) 40 MG tablet, take 1 TABLET BY MOUTH EVERY EVENING at 6pm, Disp: 28 tablet, Rfl: 2 .  Blood Glucose Monitoring Suppl (ACCU-CHEK AVIVA) device, Use as instructed1 times daily before meals, Disp: 1 each, Rfl: 0 .  diclofenac sodium (VOLTAREN) 1 % GEL, Apply 4 g topically 4 (four) times daily. 100/16=7 (Patient taking differently: Apply 4 g topically 4 (four) times daily. AS NEEDED FOR PAIN), Disp: 100 g, Rfl: 1 .  digoxin (LANOXIN) 0.125 MG tablet, Take 0.5 tablets (0.0625 mg total) by mouth daily., Disp: 14 tablet, Rfl: 3 .  gabapentin (NEURONTIN) 300 MG capsule, Take 1 capsule (300 mg total) by mouth daily., Disp: , Rfl:  .  glipiZIDE (GLUCOTROL) 10 MG tablet, Take 1 tablet (10 mg total) by mouth 2 (two) times daily before a meal., Disp: 60 tablet,  Rfl: 5 .  glucose blood (ACCU-CHEK AVIVA) test strip, Use as instructed for 1 times daily testing of blood sugar, Disp: 100 each, Rfl: 12 .  hydrALAZINE (APRESOLINE) 25 MG tablet, take 1 TABLET BY MOUTH THREE TIMES DAILY every morning, noon,evening, Disp: 90 tablet, Rfl: 3 .  Insulin Glargine (LANTUS) 100 UNIT/ML Solostar Pen, Inject 38 Units into the skin daily at 10 pm., Disp: 4 pen, Rfl: 5 .   Insulin Pen Needle 31G X 5 MM MISC, Use as directed for once daily insulin injection, Disp: 100 each, Rfl: 2 .  isosorbide mononitrate (IMDUR) 30 MG 24 hr tablet, Take 1 tablet (30 mg total) by mouth daily., Disp: 28 tablet, Rfl: 3 .  Lancet Devices (ACCU-CHEK SOFTCLIX) lancets, Use as instructed for once daily testing of blood sugar, Disp: 1 each, Rfl: 0 .  metolazone (ZAROXOLYN) 2.5 MG tablet, Take 1 tablet once as directed by CHF clinic., Disp: 3 tablet, Rfl: 0 .  torsemide (DEMADEX) 20 MG tablet, Take 4 tablets (80 mg total) by mouth 2 (two) times daily., Disp: 240 tablet, Rfl: 3 .  traZODone (DESYREL) 100 MG tablet, Take 1 tablet (100 mg total) by mouth at bedtime as needed for sleep., Disp: 30 tablet, Rfl: 3 .  XARELTO 20 MG TABS tablet, take one 20mg  tablet daily by mouth, Disp: 28 tablet, Rfl: 2 Allergies  Allergen Reactions  . Pork-Derived Products Other (See Comments)    Religious preference     Social History   Socioeconomic History  . Marital status: Divorced    Spouse name: Not on file  . Number of children: 3  . Years of education: Not on file  . Highest education level: Not on file  Social Needs  . Financial resource strain: Not on file  . Food insecurity - worry: Not on file  . Food insecurity - inability: Not on file  . Transportation needs - medical: Not on file  . Transportation needs - non-medical: Not on file  Occupational History  . Occupation: disabled  Tobacco Use  . Smoking status: Current Every Day Smoker    Packs/day: 0.50    Years: 40.00    Pack years: 20.00    Types: Cigarettes  . Smokeless tobacco: Never Used  Substance and Sexual Activity  . Alcohol use: No    Alcohol/week: 0.0 oz  . Drug use: No  . Sexual activity: No  Other Topics Concern  . Not on file  Social History Narrative   Has an apartment with a roommate. He was living on the streets in 2013/01/19.  He reports that his father died in [place] in 01-19-2013.  He is divorced.  He  is no longer estranged from his son, but still from his daughter who lives locally.  Neither of his parents, nor any siblings have any history of CAD.    Physical Exam      Future Appointments  Date Time Provider Department Center  05/28/2017  2:30 PM [facility] PA/NP [facility] None  06/02/2017  7:25 AM [facility] DEVICE REMOTES [facility]  06/04/2017  3:00 PM Hoy Register, MD [facility] None  06/27/2017  9:00 AM [facility] HS VASC 4 [facility] VVS  06/27/2017  9:45 AM Maeola Harman, MD [facility]  07/30/2017  1:30 PM Hoy Register, MD [facility] None    ATF pt CAO x4 sitting on the edge of his bed, just woke up. He still have issues staying sleep at night even after taking the trazodone. Pt is  still smoking 2 packs cigarettes daily. He stated that he hadn't been eating much besides chesses and milk. He stated that his aide hasn't come all week besides today.  Pt denies sob, dizziness and chest pain.   rx packs verified and theres several meds are missing. I will take them back to the pharmacy for revision.    BP (!) 142/80 (BP Location: Right Arm, Patient Position: Sitting, Cuff Size: Normal)   Pulse 89   Resp 16   Wt 248 lb 12.8 oz (112.9 kg)   SpO2 96%   BMI 41.40 kg/m  cbg 430  **medication pack is missing several meds: Hydralazine Torsemide   Weight yesterday-246 Last visit weight-243    Sibley Rolison, EMT Paramedic 05/23/2017    ACTION: Home visit completed

## 2017-05-28 ENCOUNTER — Telehealth: Payer: Self-pay

## 2017-05-28 ENCOUNTER — Ambulatory Visit (HOSPITAL_COMMUNITY)
Admission: RE | Admit: 2017-05-28 | Discharge: 2017-05-28 | Disposition: A | Discharge status: Home / Self Care | Payer: Medicare Other | Source: Ambulatory Visit | Attending: Internal Medicine | Admitting: Internal Medicine

## 2017-05-28 ENCOUNTER — Ambulatory Visit: Payer: Medicare Other | Admitting: Family Medicine

## 2017-05-28 VITALS — BP 136/72 | HR 116 | Wt 257.0 lb

## 2017-05-28 DIAGNOSIS — I482 Chronic atrial fibrillation: Secondary | ICD-10-CM | POA: Insufficient documentation

## 2017-05-28 DIAGNOSIS — Z596 Low income: Secondary | ICD-10-CM | POA: Insufficient documentation

## 2017-05-28 DIAGNOSIS — I5042 Chronic combined systolic (congestive) and diastolic (congestive) heart failure: Secondary | ICD-10-CM | POA: Diagnosis not present

## 2017-05-28 DIAGNOSIS — E1122 Type 2 diabetes mellitus with diabetic chronic kidney disease: Secondary | ICD-10-CM | POA: Diagnosis not present

## 2017-05-28 DIAGNOSIS — Z95810 Presence of automatic (implantable) cardiac defibrillator: Secondary | ICD-10-CM | POA: Insufficient documentation

## 2017-05-28 DIAGNOSIS — Z794 Long term (current) use of insulin: Secondary | ICD-10-CM | POA: Diagnosis not present

## 2017-05-28 DIAGNOSIS — I10 Essential (primary) hypertension: Secondary | ICD-10-CM

## 2017-05-28 DIAGNOSIS — Z79899 Other long term (current) drug therapy: Secondary | ICD-10-CM | POA: Insufficient documentation

## 2017-05-28 DIAGNOSIS — Z7901 Long term (current) use of anticoagulants: Secondary | ICD-10-CM | POA: Diagnosis not present

## 2017-05-28 DIAGNOSIS — I5022 Chronic systolic (congestive) heart failure: Secondary | ICD-10-CM

## 2017-05-28 DIAGNOSIS — Z720 Tobacco use: Secondary | ICD-10-CM

## 2017-05-28 DIAGNOSIS — I110 Hypertensive heart disease with heart failure: Secondary | ICD-10-CM | POA: Diagnosis not present

## 2017-05-28 DIAGNOSIS — N183 Chronic kidney disease, stage 3 (moderate): Secondary | ICD-10-CM | POA: Diagnosis not present

## 2017-05-28 DIAGNOSIS — J069 Acute upper respiratory infection, unspecified: Secondary | ICD-10-CM | POA: Diagnosis not present

## 2017-05-28 DIAGNOSIS — F17210 Nicotine dependence, cigarettes, uncomplicated: Secondary | ICD-10-CM | POA: Diagnosis not present

## 2017-05-28 DIAGNOSIS — I481 Persistent atrial fibrillation: Secondary | ICD-10-CM | POA: Diagnosis not present

## 2017-05-28 DIAGNOSIS — I2510 Atherosclerotic heart disease of native coronary artery without angina pectoris: Secondary | ICD-10-CM | POA: Diagnosis not present

## 2017-05-28 DIAGNOSIS — Z9119 Patient's noncompliance with other medical treatment and regimen: Secondary | ICD-10-CM | POA: Diagnosis not present

## 2017-05-28 DIAGNOSIS — Z951 Presence of aortocoronary bypass graft: Secondary | ICD-10-CM | POA: Insufficient documentation

## 2017-05-28 DIAGNOSIS — I130 Hypertensive heart and chronic kidney disease with heart failure and stage 1 through stage 4 chronic kidney disease, or unspecified chronic kidney disease: Secondary | ICD-10-CM | POA: Diagnosis not present

## 2017-05-28 DIAGNOSIS — E1151 Type 2 diabetes mellitus with diabetic peripheral angiopathy without gangrene: Secondary | ICD-10-CM | POA: Insufficient documentation

## 2017-05-28 DIAGNOSIS — E785 Hyperlipidemia, unspecified: Secondary | ICD-10-CM | POA: Diagnosis not present

## 2017-05-28 DIAGNOSIS — I252 Old myocardial infarction: Secondary | ICD-10-CM | POA: Diagnosis not present

## 2017-05-28 DIAGNOSIS — Z833 Family history of diabetes mellitus: Secondary | ICD-10-CM | POA: Diagnosis not present

## 2017-05-28 DIAGNOSIS — I5023 Acute on chronic systolic (congestive) heart failure: Secondary | ICD-10-CM | POA: Diagnosis not present

## 2017-05-28 DIAGNOSIS — E876 Hypokalemia: Secondary | ICD-10-CM | POA: Insufficient documentation

## 2017-05-28 DIAGNOSIS — Z91199 Patient's noncompliance with other medical treatment and regimen due to unspecified reason: Secondary | ICD-10-CM

## 2017-05-28 DIAGNOSIS — E7800 Pure hypercholesterolemia, unspecified: Secondary | ICD-10-CM | POA: Insufficient documentation

## 2017-05-28 DIAGNOSIS — I4819 Other persistent atrial fibrillation: Secondary | ICD-10-CM

## 2017-05-28 LAB — COMPREHENSIVE METABOLIC PANEL
ALBUMIN: 3.1 g/dL — AB (ref 3.5–5.0)
ALT: 32 U/L (ref 17–63)
AST: 28 U/L (ref 15–41)
Alkaline Phosphatase: 174 U/L — ABNORMAL HIGH (ref 38–126)
Anion gap: 11 (ref 5–15)
BUN: 26 mg/dL — AB (ref 6–20)
CHLORIDE: 95 mmol/L — AB (ref 101–111)
CO2: 26 mmol/L (ref 22–32)
CREATININE: 1.35 mg/dL — AB (ref 0.61–1.24)
Calcium: 8.6 mg/dL — ABNORMAL LOW (ref 8.9–10.3)
GFR calc Af Amer: >60 mL/min (ref >60)
GFR calc non Af Amer: 56 mL/min — ABNORMAL LOW (ref >60)
GLUCOSE: 274 mg/dL — AB (ref 65–99)
POTASSIUM: 4 mmol/L (ref 3.5–5.1)
SODIUM: 132 mmol/L — AB (ref 135–145)
Total Bilirubin: 0.6 mg/dL (ref 0.3–1.2)
Total Protein: 7.9 g/dL (ref 6.5–8.1)

## 2017-05-28 LAB — BRAIN NATRIURETIC PEPTIDE: B Natriuretic Peptide: 279 pg/mL — ABNORMAL HIGH (ref 0.0–100.0)

## 2017-05-28 LAB — T4, FREE: Free T4: 1.48 ng/dL — ABNORMAL HIGH (ref 0.61–1.12)

## 2017-05-28 LAB — TSH: TSH: 2.408 u[IU]/mL (ref 0.350–4.500)

## 2017-05-28 MED ORDER — METOLAZONE 2.5 MG PO TABS: ORAL_TABLET | ORAL | 0 refills | Status: DC

## 2017-05-28 NOTE — Patient Instructions (Signed)
Take metolazone 2.5 mg tablet once tomorrow and once Friday ONLY.  Routine lab work today. Will notify you of abnormal results, otherwise no news is good news!  Follow up 2 weeks with Otilio Saber PA-C.  _____________________________________________________________ [place] Code: 9002  Take all medication as prescribed the day of your appointment. Bring all medications with you to your appointment.  Do the following things EVERYDAY: 1) Weigh yourself in the morning before breakfast. Write it down and keep it in a log. 2) Take your medicines as prescribed 3) Eat low salt foods-Limit salt (sodium) to 2000 mg per day.  4) Stay as active as you can everyday 5) Limit all fluids for the day to less than 2 liters

## 2017-05-28 NOTE — Progress Notes (Signed)
Patient ID: Jacob [name], male   DOB: Jun 11, 1955, 62 y.o.   MRN: [id number]    Advanced Heart Failure Clinic Note   PCP: [facility] Primary HF Cardiologist: Dr Gala Romney   HPI: Jacob [name] is a 62 y.o. male [place] male with history of systolic HF EF 20-25% via Echo 08/03/14 s/p INCEPTA ICD implant 6/14, RV dysfunction, CAD s/p CABG x 2 w/ RF MAZE 7/14 at [facility], DM type II, Chronic afib on xarelto, GI bleed 11/28/2014, and HLD.   Admitted the end of August 2016  with increased dyspnea on exertion. Later found to be in cardiogenic shock . At one point on dual pressors milrinone and norepi. Diuresed with IV lasix and transitioned to po lasix. Hospital course complicated by GI bleed and A fib RVR. Loaded on amio but later placed on toprol xl for rate control. On 9/5 had EGD with duodenal bleed with clip applied. He was discharged 9/12 with D/C weight 203 pounds.   Admitted 1/29 - 04/24/17 with CP and HF. Cath 04/23/17 as below with stable CAD and compensated filling pressures after IV diuresis.   He presents today for follow up.  Weight up 10 lbs since last visit. He is more SOB and orthopnea.  No SOB with ADLs but DOE with anything more. Denies lightheadedness or dizziness. Smokes ~ 1.2 ppd. No further chest pain.  He has pill packs, so is taking medications as directed. He states he has had a cold with white/yellow sputum production x 2-3 days. Denies fever or chills.  No recent sick contacts.   SH: Lives in low income housing. Estranged from family  Review of systems complete and found to be negative unless listed in HPI.    [facility] 04/23/17  Ost RCA to Prox RCA lesion is 100% stenosed.  Ost Cx to Prox Cx lesion is 99% stenosed.  Ost LAD to Prox LAD lesion is 40% stenosed.  Prox LAD to Mid LAD lesion is 40% stenosed.  Dist LAD lesion is 70% stenosed.   Findings:  Ao = 126/66 (86)  LV = 118/14 RA = 8 RV = 44/8 PA = 52/17 (34) PCW = 17 Fick cardiac output/index =  4.2/2.0 PVR = 4.0 WU Ao sat = 98% PA sat = 60%, 61%  Assessment: 1. 3v CAD with stable revascularization with patent SVG to LPDA and SVG to Ramus 2. LAD with non-obstructive disease 3. Filling pressures normal 4. Moderately depressed cardiac output  SH:  Social History   Socioeconomic History  . Marital status: Divorced    Spouse name: Not on file  . Number of children: 3  . Years of education: Not on file  . Highest education level: Not on file  Social Needs  . Financial resource strain: Not on file  . Food insecurity - worry: Not on file  . Food insecurity - inability: Not on file  . Transportation needs - medical: Not on file  . Transportation needs - non-medical: Not on file  Occupational History  . Occupation: disabled  Tobacco Use  . Smoking status: Current Every Day Smoker    Packs/day: 0.50    Years: 40.00    Pack years: 20.00    Types: Cigarettes  . Smokeless tobacco: Never Used  Substance and Sexual Activity  . Alcohol use: No    Alcohol/week: 0.0 oz  . Drug use: No  . Sexual activity: No  Other Topics Concern  . Not on file  Social History Narrative   Has  an apartment with a roommate. He was living on the streets in 28-Jan-2013.  He reports that his father died in [place] in 01/28/13.  He is divorced.  He is no longer estranged from his son, but still from his daughter who lives locally.  Neither of his parents, nor any siblings have any history of CAD.   FH:  Family History  Problem Relation Age of Onset  . Diabetes Mother    Past Medical History:  Diagnosis Date  . Atrial fibrillation (HCC)    RVR 10/2014  . Automatic implantable cardioverter-defibrillator in situ   . CAD (coronary artery disease) Sept 2013   s/p cardiac cath showing occlusion of small RCA with collaterals  . CHF (congestive heart failure) (HCC)    20 to 25 % EF and RV dysfunction by 07/2014 echo   . Chronic anticoagulation    on xarelto.   . High cholesterol   .  Hypertension   . Myocardial infarction (HCC) 2014  . Noncompliance    homelessness contributing.   . Peripheral arterial disease (HCC)   . Type II diabetes mellitus (HCC)    Current Outpatient Medications  Medication Sig Dispense Refill  . ACCU-CHEK SOFTCLIX LANCETS lancets Use for once daily testing of blood sugar 100 each 12  . albuterol (PROVENTIL HFA;VENTOLIN HFA) 108 (90 Base) MCG/ACT inhaler Inhale 2 puffs into the lungs every 6 (six) hours as needed for wheezing or shortness of breath. 1 Inhaler 2  . albuterol (PROVENTIL) (2.5 MG/3ML) 0.083% nebulizer solution Take 3 mLs (2.5 mg total) by nebulization every 6 (six) hours as needed for wheezing or shortness of breath. 150 mL 1  . allopurinol (ZYLOPRIM) 100 MG tablet Take 2 tablets (200 mg total) by mouth daily. 60 tablet 5  . atorvastatin (LIPITOR) 40 MG tablet take 1 TABLET BY MOUTH EVERY EVENING at 6pm 28 tablet 2  . Blood Glucose Monitoring Suppl (ACCU-CHEK AVIVA) device Use as instructed1 times daily before meals 1 each 0  . diclofenac sodium (VOLTAREN) 1 % GEL Apply 4 g topically 4 (four) times daily. 100/16=7 (Patient taking differently: Apply 4 g topically 4 (four) times daily. AS NEEDED FOR PAIN) 100 g 1  . digoxin (LANOXIN) 0.125 MG tablet Take 0.5 tablets (0.0625 mg total) by mouth daily. 14 tablet 3  . gabapentin (NEURONTIN) 300 MG capsule Take 1 capsule (300 mg total) by mouth daily.    Marland Kitchen glipiZIDE (GLUCOTROL) 10 MG tablet Take 1 tablet (10 mg total) by mouth 2 (two) times daily before a meal. 60 tablet 5  . glucose blood (ACCU-CHEK AVIVA) test strip Use as instructed for 1 times daily testing of blood sugar 100 each 12  . hydrALAZINE (APRESOLINE) 25 MG tablet take 1 TABLET BY MOUTH THREE TIMES DAILY every morning, noon,evening 90 tablet 3  . Insulin Glargine (LANTUS) 100 UNIT/ML Solostar Pen Inject 38 Units into the skin daily at 10 pm. 4 pen 5  . Insulin Pen Needle 31G X 5 MM MISC Use as directed for once daily insulin  injection 100 each 2  . isosorbide mononitrate (IMDUR) 30 MG 24 hr tablet Take 1 tablet (30 mg total) by mouth daily. 28 tablet 3  . Lancet Devices (ACCU-CHEK SOFTCLIX) lancets Use as instructed for once daily testing of blood sugar 1 each 0  . metolazone (ZAROXOLYN) 2.5 MG tablet Take 1 tablet once as directed by CHF clinic. 3 tablet 0  . torsemide (DEMADEX) 20 MG tablet Take 4 tablets (80 mg  total) by mouth 2 (two) times daily. 240 tablet 3  . traZODone (DESYREL) 100 MG tablet Take 1 tablet (100 mg total) by mouth at bedtime as needed for sleep. 30 tablet 3  . XARELTO 20 MG TABS tablet take one 20mg  tablet daily by mouth 28 tablet 2   No current facility-administered medications for this encounter.    Vitals:   05/28/17 1524  BP: 136/72  Pulse: (!) 116  SpO2: 97%  Weight: 257 lb (116.6 kg)   Wt Readings from Last 3 Encounters:  05/28/17 257 lb (116.6 kg)  05/23/17 248 lb 12.8 oz (112.9 kg)  05/02/17 248 lb (112.5 kg)     PHYSICAL EXAM: General: Fatigued appearing. NAD. Neck: Supple. JVP 9-10 cm. Carotids 2+ bilat; no bruits. No thyromegaly or nodule noted. Cor: PMI nondisplaced. Irregularly irregular, slightly tachy No M/G/R noted Lungs: CTAB, normal effort. Abdomen: Obese, slightly distended, non-tender, No HSM. No bruits or masses. +BS  Extremities: No cyanosis, clubbing, or rash. Trace to 1 + ankle edema.  Neuro: Alert & orientedx3, cranial nerves grossly intact. moves all 4 extremities w/o difficulty. Affect pleasant   EKG shows Afib 102 bpm, personally reviewed  ASSESSMENT & PLAN: 1. Chronic Systolic Heart Failure: Echo 10/2015 EF 25-30%. Has [employer] ICD.  - [facility] 03/2017 as above with stable CAD and compensated filling pressures.  - NYHA III symptoms with orthopnea.  - He is volume overloaded on exam.  BMET/BNP today.   - Continue torsemide 80 mg BID. Take metolazone 2.5 mg tomorrow and Friday morning.  - Continue spiro 25 mg daily.  - Continue digoxin 0.0625  mg daily.  - Continue hydralazine 25 mg TID.  - No beta blocker with acute decompensation - Reinforced fluid restriction to < 2 L daily, sodium restriction to less than 2000 mg daily, and the importance of daily weights.    2. CAD s/p CABG 2014 - R/[facility] 03/2017 as above with stable CAD and compensated filling pressures.  - No s/s of ischemia.   2. Chronic A fib - Rate controlled overall. Slightly elevated today in setting of CHF and URI.  - Continue Amiodarone 200 mg daily.  - Continue Xarelto for anticoagulation. - High Res Chest CT with questionable fibrotic interstitial pneumonitis cannot be excluded.   3. H/O GI Bleed - duodenal ulcer with clip 11/2003 - Denies bleeding.    4. PAD- Last ABI 2015  - R EIA stent with known bilateral SFA occlusion.  - Follows with Dr. Allyson Sabal: "He is not a candidate for endovascular therapy of his SFAs and would require femoropopliteal bypass grafting which I think he would be high risk for given his cardiac situation" - Continues to smoke. Encouraged complete cessation. No change.   5. Social  Issues - Resides in low income housing.  - Nurse, adult. No change.  - HFSW to see today.   6. Smoking - Encouraged complete cessation. No change.   7. HTN - Stable on current meds.   8. Hypokalemia - BMET today.   Volume overloaded today in setting of URI and questionable compliance. Metolazone x 2 days as above. Paramedicine will see later this week + next week. RTC 2 weeks. Knows to go to ED with worsening symptoms.   Graciella Freer, PA-C  3:38 PM   Greater than 50% of the 25 minute visit was spent in counseling/coordination of care regarding disease state education, salt/fluid restriction, sliding scale diuretics, and medication compliance.
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
## 2017-05-29 DIAGNOSIS — E1122 Type 2 diabetes mellitus with diabetic chronic kidney disease: Secondary | ICD-10-CM | POA: Diagnosis not present

## 2017-05-29 DIAGNOSIS — I482 Chronic atrial fibrillation: Secondary | ICD-10-CM | POA: Diagnosis not present

## 2017-05-29 DIAGNOSIS — I2510 Atherosclerotic heart disease of native coronary artery without angina pectoris: Secondary | ICD-10-CM | POA: Diagnosis not present

## 2017-05-29 DIAGNOSIS — I130 Hypertensive heart and chronic kidney disease with heart failure and stage 1 through stage 4 chronic kidney disease, or unspecified chronic kidney disease: Secondary | ICD-10-CM | POA: Diagnosis not present

## 2017-05-29 DIAGNOSIS — N183 Chronic kidney disease, stage 3 (moderate): Secondary | ICD-10-CM | POA: Diagnosis not present

## 2017-05-29 DIAGNOSIS — I5023 Acute on chronic systolic (congestive) heart failure: Secondary | ICD-10-CM | POA: Diagnosis not present

## 2017-06-02 ENCOUNTER — Ambulatory Visit: Payer: Medicare Other | Attending: Family Medicine | Admitting: Family Medicine

## 2017-06-02 ENCOUNTER — Telehealth: Payer: Self-pay

## 2017-06-02 ENCOUNTER — Encounter: Payer: Medicare Other | Admitting: *Deleted

## 2017-06-02 ENCOUNTER — Encounter: Payer: Self-pay | Admitting: Family Medicine

## 2017-06-02 ENCOUNTER — Telehealth: Payer: Self-pay | Admitting: Cardiology

## 2017-06-02 VITALS — BP 110/66 | HR 108 | Temp 98.2°F | Ht 65.0 in | Wt 246.4 lb

## 2017-06-02 DIAGNOSIS — Z794 Long term (current) use of insulin: Secondary | ICD-10-CM | POA: Diagnosis not present

## 2017-06-02 DIAGNOSIS — Z955 Presence of coronary angioplasty implant and graft: Secondary | ICD-10-CM | POA: Diagnosis not present

## 2017-06-02 DIAGNOSIS — I110 Hypertensive heart disease with heart failure: Secondary | ICD-10-CM | POA: Diagnosis not present

## 2017-06-02 DIAGNOSIS — Z951 Presence of aortocoronary bypass graft: Secondary | ICD-10-CM | POA: Insufficient documentation

## 2017-06-02 DIAGNOSIS — I5042 Chronic combined systolic (congestive) and diastolic (congestive) heart failure: Secondary | ICD-10-CM | POA: Insufficient documentation

## 2017-06-02 DIAGNOSIS — I252 Old myocardial infarction: Secondary | ICD-10-CM | POA: Diagnosis not present

## 2017-06-02 DIAGNOSIS — I739 Peripheral vascular disease, unspecified: Secondary | ICD-10-CM

## 2017-06-02 DIAGNOSIS — E7800 Pure hypercholesterolemia, unspecified: Secondary | ICD-10-CM | POA: Insufficient documentation

## 2017-06-02 DIAGNOSIS — I5022 Chronic systolic (congestive) heart failure: Secondary | ICD-10-CM

## 2017-06-02 DIAGNOSIS — E1151 Type 2 diabetes mellitus with diabetic peripheral angiopathy without gangrene: Secondary | ICD-10-CM

## 2017-06-02 DIAGNOSIS — I255 Ischemic cardiomyopathy: Secondary | ICD-10-CM | POA: Diagnosis not present

## 2017-06-02 DIAGNOSIS — I482 Chronic atrial fibrillation: Secondary | ICD-10-CM | POA: Insufficient documentation

## 2017-06-02 DIAGNOSIS — E1159 Type 2 diabetes mellitus with other circulatory complications: Secondary | ICD-10-CM

## 2017-06-02 DIAGNOSIS — Z79899 Other long term (current) drug therapy: Secondary | ICD-10-CM | POA: Diagnosis not present

## 2017-06-02 DIAGNOSIS — I2510 Atherosclerotic heart disease of native coronary artery without angina pectoris: Secondary | ICD-10-CM | POA: Insufficient documentation

## 2017-06-02 DIAGNOSIS — R5383 Other fatigue: Secondary | ICD-10-CM | POA: Diagnosis not present

## 2017-06-02 DIAGNOSIS — Z95810 Presence of automatic (implantable) cardiac defibrillator: Secondary | ICD-10-CM | POA: Insufficient documentation

## 2017-06-02 DIAGNOSIS — I10 Essential (primary) hypertension: Secondary | ICD-10-CM

## 2017-06-02 DIAGNOSIS — I4891 Unspecified atrial fibrillation: Secondary | ICD-10-CM | POA: Diagnosis not present

## 2017-06-02 DIAGNOSIS — E559 Vitamin D deficiency, unspecified: Secondary | ICD-10-CM | POA: Diagnosis not present

## 2017-06-02 DIAGNOSIS — Z7901 Long term (current) use of anticoagulants: Secondary | ICD-10-CM | POA: Insufficient documentation

## 2017-06-02 LAB — GLUCOSE, POCT (MANUAL RESULT ENTRY): POC GLUCOSE: 245 mg/dL — AB (ref 70–99)

## 2017-06-02 MED ORDER — ACCU-CHEK AVIVA DEVI: 0 refills | Status: AC

## 2017-06-02 MED ORDER — ACCU-CHEK SOFTCLIX LANCETS MISC: 12 refills | Status: AC

## 2017-06-02 MED ORDER — GABAPENTIN 300 MG PO CAPS: 300.0000 mg | ORAL_CAPSULE | Freq: Every day | ORAL | 3 refills | Status: DC

## 2017-06-02 MED ORDER — GLUCOSE BLOOD VI STRP: ORAL_STRIP | 12 refills | Status: AC

## 2017-06-02 NOTE — Telephone Encounter (Signed)
LMOVM reminding pt to send remote transmission.   

## 2017-06-02 NOTE — Patient Instructions (Signed)

## 2017-06-02 NOTE — Telephone Encounter (Signed)
Met with the patient when he was in the clinic today. He explained that he is having a hard time paying his rent. He said that he found a cheaper apartment but they will not rent to him because of a problem he had with a landlord in the past. He explained that he was renting an apartment with his wife and about 4 years ago, she left him and the lease was in his name. He was not able to pay the rent at that time. This CM encouraged him to contact Juanetta Snow, [employer] [place] to discuss the financial concerns he has about renting an apartment.  Discussed the role of the [employer] but he said that he did not need help locating an apartment, he already found one but the landlord will not rent to him. He also said that he will go to DSS to inquire about receiving assistance with his electric bill. He did note that they provided him with financial assistance last year.  He also stated that he is in the process of applying for SSI.    Acquanetta Belling Ausdell, [place] met with the patient to review his pre-packaged medications. As per Lurena Joiner, the torsemide and metolazone are missing and the medications are in backwards - ie- the bedtime medications are in the morning pouch and the morning medications are in the bedtime pouch. Gabapentin is also missing from the packages as well as the medication list on the packet. Call placed to [place], EMT # [phone] to notify her of the discrepancy with the medications and also to notify her that the patient would like her to pick up the glucometer and testing supplies at his pharmacy. Voicemail message left requesting a call back to # [phone].   This CM also provided the patient with the number for DSS to register for transportation to his medical appointments and explained to him that it is important to register for transportation as he will need on-going transportation to his appointments.

## 2017-06-02 NOTE — Progress Notes (Signed)
Subjective:  Patient ID: Jacob [name], male    DOB: September 07, 1955  Age: 62 y.o. 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  He presents today stating he does not feel well and "something is not right with my medicine."  He has his bubble pack with him which [name].  He complains of excessive dizziness, reduced energy, early morning sleepiness and complains that he is not urinating as he should and has had to take leftover diuretic pills in the bottles which he has at home in addition to the medications in the bubble pack to help him urinate. He was seen by cardiology on 05/28/16 at which time torsemide was increased to 80 mg twice a day and he was to take an additional metolazone on Thursday and Friday of last week due to being fluid overloaded. His weight is down 11 pounds in the last 5 days but he attributes this to taking an extra diuretic pill.  I have reviewed his bubble pack with the pharmacist Jacob [name]) in the clinic and his medications seems to have been packed wrong: trazodone seems to have been placed in the morning compartment, he also does not have the dose of torsemide prescribed in his pack  Wit regards to his diabetes mellitus he endorses compliance with his insulins but has not been checking his sugars due to lack of a glucometer.  Past Medical History:    Diagnosis Date  . Atrial fibrillation ([place])    RVR 10/2014  . Automatic implantable cardioverter-defibrillator in situ   . CAD (coronary artery disease) Sept 2013   s/p cardiac cath showing occlusion of small RCA with collaterals  . CHF (congestive heart failure) ([place])    20 to 25 % EF and RV dysfunction by 07/2014 echo   . Chronic anticoagulation    on xarelto.   . High cholesterol   . Hypertension   . Myocardial infarction ([place]) 2014  . Noncompliance    homelessness contributing.   . Peripheral arterial disease ([place])   . Type II diabetes mellitus ([place])     Past Surgical History:  Procedure Laterality Date  . CARDIAC CATHETERIZATION  09/2012  . CORONARY ANGIOPLASTY WITH STENT PLACEMENT  11/2011   "1"  . CORONARY ARTERY BYPASS GRAFT  09/2012   2 vessels per patient Jacob [name])   . ESOPHAGOGASTRODUODENOSCOPY N/A 11/28/2014   Procedure: ESOPHAGOGASTRODUODENOSCOPY (EGD);  Surgeon: Jacob Bears, MD;  Location: [facility] ENDOSCOPY;  Service: Endoscopy;  Laterality: N/A;  . ILIAC ARTERY STENT Right 08/30/2013  . IMPLANTABLE CARDIOVERTER DEFIBRILLATOR IMPLANT     [place] in 07/2012; [employer]  . LEFT AND RIGHT HEART CATHETERIZATION WITH CORONARY ANGIOGRAM N/A 12/02/2011   Procedure: LEFT AND RIGHT HEART CATHETERIZATION WITH CORONARY ANGIOGRAM;  Surgeon: Jacob Blanks, MD;  Location: [facility] CATH LAB;  Service: Cardiovascular;  Laterality: N/A;  . LOWER EXTREMITY ANGIOGRAM N/A 08/30/2013   Procedure: LOWER EXTREMITY ANGIOGRAM;  Surgeon: Jacob Harp, MD;  Location: [facility] CATH  LAB;  Service: Cardiovascular;  Laterality: N/A;  . LOWER EXTREMITY ANGIOGRAM N/A 12/02/2013   Procedure: LOWER EXTREMITY ANGIOGRAM;  Surgeon: Jacob Harp, MD;  Location: [facility] CATH LAB;  Service: Cardiovascular;  Laterality: N/A;  . RIGHT/LEFT HEART CATH AND CORONARY/GRAFT ANGIOGRAPHY N/A 04/23/2017   Procedure: RIGHT/LEFT HEART CATH AND CORONARY/GRAFT ANGIOGRAPHY;  Surgeon: Jacob Artist, MD;  Location: [facility]  CV LAB;  Service: Cardiovascular;  Laterality: N/A;    Allergies  Allergen Reactions  . Pork-Derived Products Other (See Comments)    Religious preference      Outpatient Medications Prior to Visit  Medication Sig Dispense Refill  . albuterol (PROVENTIL HFA;VENTOLIN HFA) 108 (90 Base) MCG/ACT inhaler Inhale 2 puffs into the lungs every 6 (six) hours as needed for wheezing or shortness of breath. 1 Inhaler 2  . albuterol (PROVENTIL) (2.5 MG/3ML) 0.083% nebulizer solution Take 3 mLs (2.5 mg total) by nebulization every 6 (six) hours as needed for wheezing or shortness of breath. 150 mL 1  . allopurinol (ZYLOPRIM) 100 MG tablet Take 2 tablets (200 mg total) by mouth daily. 60 tablet 5  . atorvastatin (LIPITOR) 40 MG tablet take 1 TABLET BY MOUTH EVERY EVENING at 6pm 28 tablet 2  . digoxin (LANOXIN) 0.125 MG tablet Take 0.5 tablets (0.0625 mg total) by mouth daily. 14 tablet 3  . glipiZIDE (GLUCOTROL) 10 MG tablet Take 1 tablet (10 mg total) by mouth 2 (two) times daily before a meal. 60 tablet 5  . hydrALAZINE (APRESOLINE) 25 MG tablet take 1 TABLET BY MOUTH THREE TIMES DAILY every morning, noon,evening 90 tablet 3  . Insulin Glargine (LANTUS) 100 UNIT/ML Solostar Pen Inject 38 Units into the skin daily at 10 pm. 4 pen 5  . isosorbide mononitrate (IMDUR) 30 MG 24 hr tablet Take 1 tablet (30 mg total) by mouth daily. 28 tablet 3  . torsemide (DEMADEX) 20 MG tablet Take 4 tablets (80 mg total) by mouth 2 (two) times daily. 240 tablet 3  . traZODone (DESYREL) 100 MG tablet Take 1 tablet (100 mg total) by mouth at bedtime as needed for sleep. 30 tablet 3  . XARELTO 20 MG TABS tablet take one '20mg'$  tablet daily by mouth 28 tablet 2  . diclofenac sodium (VOLTAREN) 1 % GEL Apply 4 g topically 4 (four) times daily. 100/16=7 (Patient not taking: Reported on 06/02/2017) 100 g 1  . Insulin Pen Needle 31G X 5 MM MISC Use as directed for once daily insulin injection (Patient not taking: Reported on  06/02/2017) 100 each 2  . Lancet Devices (ACCU-CHEK SOFTCLIX) lancets Use as instructed for once daily testing of blood sugar (Patient not taking: Reported on 06/02/2017) 1 each 0  . metolazone (ZAROXOLYN) 2.5 MG tablet Take 1 tablet once as directed by CHF clinic. (Patient not taking: Reported on 06/02/2017) 5 tablet 0  . ACCU-CHEK SOFTCLIX LANCETS lancets Use for once daily testing of blood sugar (Patient not taking: Reported on 06/02/2017) 100 each 12  . Blood Glucose Monitoring Suppl (ACCU-CHEK AVIVA) device Use as instructed1 times daily before meals (Patient not taking: Reported on 06/02/2017) 1 each 0  . gabapentin (NEURONTIN) 300 MG capsule Take 1 capsule (300 mg total) by mouth daily. (Patient not taking: Reported on 06/02/2017)    . glucose blood (ACCU-CHEK AVIVA) test strip Use as instructed for 1 times daily testing of blood sugar (Patient not taking: Reported on 06/02/2017) 100 each 12   No facility-administered medications prior to visit.  ROS Review of Systems  Constitutional: Positive for fatigue. Negative for activity change and appetite change.  HENT: Negative for sinus pressure and sore throat.   Eyes: Negative for visual disturbance.  Respiratory: Negative for cough, chest tightness and shortness of breath.   Cardiovascular: Negative for chest pain and leg swelling.  Gastrointestinal: Negative for abdominal distention, abdominal pain, constipation and diarrhea.  Endocrine: Negative.   Genitourinary: Negative for dysuria.  Musculoskeletal: Negative for joint swelling and myalgias.  Skin: Negative for rash.  Allergic/Immunologic: Negative.   Neurological: Positive for light-headedness. Negative for weakness and numbness.  Psychiatric/Behavioral: Negative for dysphoric mood and suicidal ideas.    Objective:  BP 110/66   Pulse (!) 108   Temp 98.2 F (36.8 C) (Oral)   Ht '5\' 5"'$  (1.651 m)   Wt 246 lb 6.4 oz (111.8 kg)   SpO2 96%   BMI 41.00 kg/m   BP/Weight 06/02/2017  10/26/6627 06/29/6544  Systolic BP 503 546 568  Diastolic BP 66 72 80  Wt. (Lbs) 246.4 257 248.8  BMI 41 42.77 41.4    Wt Readings from Last 3 Encounters:  06/02/17 246 lb 6.4 oz (111.8 kg)  05/28/17 257 lb (116.6 kg)  05/23/17 248 lb 12.8 oz (112.9 kg)      Physical Exam  Constitutional: He is oriented to person, place, and time. He appears well-developed and well-nourished.  Neck: No JVD present.  Cardiovascular: Normal heart sounds and intact distal pulses. Tachycardia present.  No murmur heard. Pulmonary/Chest: Effort normal and breath sounds normal. He has no wheezes. He has no rales. He exhibits no tenderness.  Abdominal: Soft. Bowel sounds are normal. He exhibits no distension and no mass. There is no tenderness.  Musculoskeletal: Normal range of motion.  Neurological: He is alert and oriented to person, place, and time.  Skin: Skin is warm and dry.  Psychiatric: He has a normal mood and affect.    Lab Results  Component Value Date   HGBA1C 10.0 05/02/2017    Assessment & Plan:   1. DM (diabetes mellitus), type 2 with peripheral vascular complications (HCC) Uncontrolled with A1c of 10.0 Regimen was adjusted at his last visit Continue current medications and we will adjust his regimen based on his home blood sugar readings once he starts checking them again Diabetic diet, lifestyle modifications - POCT glucose (manual entry) - Blood Glucose Monitoring Suppl (ACCU-CHEK AVIVA) device; Use as instructed1 times daily before meals  Dispense: 1 each; Refill: 0 - CMP14+EGFR  2. Other fatigue Could be secondary to bubble packs which were abnormally filled by the pharmacy including his taking trazodone in the morning Will exclude metabolic cause - VITAMIN D 25 Hydroxy (Vit-D Deficiency, Fractures) - CBC with Differential/Platelet  3. PVD (peripheral vascular disease) ([place]) Bilateral SFA occlusion, not a candidate for endovascular therapy and he would be high risk for  femoral-popliteal bypass graft Risk factor modification including smoking cessation -not ready to quit yet  4. Essential hypertension Controlled  5. Chronic systolic heart failure (HCC) EF 30-35% from 11/2016 He is euvolemic now; weight is down by 11 pounds in the last 5 days Continue fluid restriction of less than 2 L/day, daily weights Continue torsemide, hydralazine, isosorbide Follow-up with cardiology  6. Atrial fibrillation with controlled ventricular response (HCC) Anticoagulation with Xarelto Continue digoxin Amiodarone discontinued as fibrotic interstitial lung disease cannot be excluded on high-resolution CT chest  7. Type 2 diabetes mellitus with other circulatory complication, with long-term current use of insulin (HCC) - gabapentin (  NEURONTIN) 300 MG capsule; Take 1 capsule (300 mg total) by mouth daily.  Dispense: 30 capsule; Refill: 3   I have spoken with the case manager who will be calling the pharmacy and the paramedic to correct this error and ensure correct packing of this patient's bubble  Pack.  Meds ordered this encounter  Medications  . ACCU-CHEK SOFTCLIX LANCETS lancets    Sig: Use for once daily testing of blood sugar    Dispense:  100 each    Refill:  12    E11.9  . glucose blood (ACCU-CHEK AVIVA) test strip    Sig: Use as instructed for 1 times daily testing of blood sugar    Dispense:  100 each    Refill:  12    E11.9  . Blood Glucose Monitoring Suppl (ACCU-CHEK AVIVA) device    Sig: Use as instructed1 times daily before meals    Dispense:  1 each    Refill:  0    E11.9  . gabapentin (NEURONTIN) 300 MG capsule    Sig: Take 1 capsule (300 mg total) by mouth daily.    Dispense:  30 capsule    Refill:  3    Follow-up: Return in about 6 weeks (around 07/14/2017) for Coordination of care.   Charlott Rakes MD

## 2017-06-02 NOTE — Telephone Encounter (Signed)
Call returned from [facility] Copper, EMT/Paramedicne program. Provided her with an update from the patient's appointment at [facility] today. She explained that she is not sure that the patient brought the most recent medication package.  Informed her that the medication bubble pack that he had was set up backwards - morning was bedtime and bedtime was morning. Also noted that there was no torsemide in the bubble packaging. There was no metolazone in the packaging but is is only prescribed to take as directed by the CHF clinic.  Gabapentin was not on the medication list with the bubble pack and it was not packaged.  Geraldine Contras stated that the patient has told her that the gabapentin doesn't work and he doesn't take it , so they did not put it in the packaging. She stated that she will plan to see the patient tomorrow and will address this problem with the adherence packaging with [facility] as well. Also informed her that the patient would like her to pick up his glucometer and testing supplies at the pharmacy tomorrow.   She noted that she will consider changing pharmacies for the patient at the end of the month.

## 2017-06-03 ENCOUNTER — Telehealth: Payer: Self-pay | Admitting: Family Medicine

## 2017-06-03 ENCOUNTER — Other Ambulatory Visit (HOSPITAL_COMMUNITY): Payer: Self-pay

## 2017-06-03 ENCOUNTER — Encounter: Payer: Self-pay | Admitting: Family Medicine

## 2017-06-03 ENCOUNTER — Telehealth: Payer: Self-pay

## 2017-06-03 LAB — CBC WITH DIFFERENTIAL/PLATELET
Basophils Absolute: 0.1 x10E3/uL (ref 0.0–0.2)
Basos: 0 %
EOS (ABSOLUTE): 0.1 x10E3/uL (ref 0.0–0.4)
Eos: 1 %
Hematocrit: 36.5 % — ABNORMAL LOW (ref 37.5–51.0)
Hemoglobin: 11.2 g/dL — ABNORMAL LOW (ref 13.0–17.7)
Immature Grans (Abs): 0 x10E3/uL (ref 0.0–0.1)
Immature Granulocytes: 0 %
Lymphocytes Absolute: 1.5 x10E3/uL (ref 0.7–3.1)
Lymphs: 13 %
MCH: 24 pg — ABNORMAL LOW (ref 26.6–33.0)
MCHC: 30.7 g/dL — ABNORMAL LOW (ref 31.5–35.7)
MCV: 78 fL — ABNORMAL LOW (ref 79–97)
Monocytes Absolute: 1 x10E3/uL — ABNORMAL HIGH (ref 0.1–0.9)
Monocytes: 9 %
Neutrophils Absolute: 8.7 x10E3/uL — ABNORMAL HIGH (ref 1.4–7.0)
Neutrophils: 77 %
Platelets: 265 x10E3/uL (ref 150–379)
RBC: 4.67 x10E6/uL (ref 4.14–5.80)
RDW: 17.2 % — ABNORMAL HIGH (ref 12.3–15.4)
WBC: 11.5 x10E3/uL — ABNORMAL HIGH (ref 3.4–10.8)

## 2017-06-03 LAB — CMP14+EGFR
A/G RATIO: 1 — AB (ref 1.2–2.2)
ALK PHOS: 178 IU/L — AB (ref 39–117)
ALT: 18 IU/L (ref 0–44)
AST: 13 IU/L (ref 0–40)
Albumin: 3.8 g/dL (ref 3.6–4.8)
BUN / CREAT RATIO: 15 (ref 10–24)
BUN: 20 mg/dL (ref 8–27)
Bilirubin Total: 0.5 mg/dL (ref 0.0–1.2)
CHLORIDE: 90 mmol/L — AB (ref 96–106)
CO2: 27 mmol/L (ref 20–29)
Calcium: 9.7 mg/dL (ref 8.6–10.2)
Creatinine, Ser: 1.37 mg/dL — ABNORMAL HIGH (ref 0.76–1.27)
GFR calc Af Amer: 64 mL/min/{1.73_m2} (ref >59)
GFR calc non Af Amer: 56 mL/min/{1.73_m2} — ABNORMAL LOW (ref >59)
GLUCOSE: 191 mg/dL — AB (ref 65–99)
Globulin, Total: 4 g/dL (ref 1.5–4.5)
POTASSIUM: 3.9 mmol/L (ref 3.5–5.2)
Sodium: 137 mmol/L (ref 134–144)
Total Protein: 7.8 g/dL (ref 6.0–8.5)

## 2017-06-03 LAB — VITAMIN D 25 HYDROXY (VIT D DEFICIENCY, FRACTURES): Vit D, 25-Hydroxy: 17.4 ng/mL — ABNORMAL LOW (ref 30.0–100.0)

## 2017-06-03 MED ORDER — ERGOCALCIFEROL 1.25 MG (50000 UT) PO CAPS: 50000.0000 [IU] | ORAL_CAPSULE | ORAL | 1 refills | Status: DC

## 2017-06-03 NOTE — Telephone Encounter (Signed)
This CM met with Benard Halsted, [place] and Karena Addison Copper, EMT when Karena Addison was at the clinic today. She picked up glucometer test strips for the patient.  She explained that she took the patient's pill packs to [facility] to correct the morning/bedtime packaging sequence.  Lurena Joiner explained his concerns that the pharmacy was using torsemide '40mg'$  tablets instead of the 20 mg tablets and this was confusing the patient as he was used to taking 20 mg tablets.  He also noted that the list of medications on the packaging should reflect what pills are in the packages and this was not correct.  Karena Addison stated that she has reviewed the current medication regime with the patient and he has indicated understanding. She has also instructed him not to take any additional medication unless instructed by his provider.

## 2017-06-03 NOTE — Progress Notes (Signed)
Paramedicine Encounter    Patient ID: Jacob [name], male    DOB: 02/20/1956, 62 y.o.   MRN: [id number]    Patient Care Team: Hoy Register, MD as PCP - General (Family Medicine) Clarisa Schools, RN as Registered Nurse Jena Gauss, Gerrit Friends, MD as Consulting Physician (Gastroenterology) [place], Dennard Schaumann, RN as [facility] Care Management  Patient Active Problem List   Diagnosis Date Noted  . Exertional angina (HCC)   . Chronic systolic heart failure (HCC) 12/17/2016  . Hyperglycemia 12/02/2016  . Osteoarthritis of right knee 12/02/2016  . Congestive heart disease (HCC) 01/01/2016  . Diabetic neuropathy (HCC) 11/28/2015  . Ascites 11/09/2015  . CHF (congestive heart failure) (HCC) 11/09/2015  . Insomnia 04/18/2015  . Claudication of right lower extremity (HCC) 04/07/2015  . Cardiomyopathy, ischemic 04/07/2015  . Chronic anticoagulation 03/30/2015  . Cardiorenal syndrome with renal failure   . SOB (shortness of breath)   . Morbid obesity due to excess calories (HCC)   . Helicobacter pylori ab+ 12/12/2014  . Calf pain   . Duodenal ulcer with hemorrhage   . Hemorrhagic shock (HCC)   . Cardiogenic shock (HCC)   . Diabetes mellitus (HCC)   . Coronary artery disease involving native coronary artery of native heart without angina pectoris   . Atrial fibrillation with rapid ventricular response (HCC) 10/29/2014  . Chest pain   . Atherosclerosis of native arteries of extremity with intermittent claudication (HCC) 09/06/2014  . Atrial fibrillation with controlled ventricular response (HCC) 08/03/2014  . Bacteremia   . DM (diabetes mellitus), type 2 with peripheral vascular complications (HCC) 08/01/2014  . Anemia of chronic disease 08/01/2014  . PVD (peripheral vascular disease) (HCC) 10/29/2013  . Tobacco abuse 05/13/2013  . AF (atrial fibrillation) (HCC) 02/11/2013  . ICD - in place- BS May 2014 [facility] 02/11/2013  . Microcytic anemia 01/11/2013  . CAD- s/p CABG July  2014 [facility] 12/04/2011  . Noncompliance 12/01/2011  . Essential hypertension 10/30/2006    Current Outpatient Medications:  .  ACCU-CHEK SOFTCLIX LANCETS lancets, Use for once daily testing of blood sugar, Disp: 100 each, Rfl: 12 .  albuterol (PROVENTIL HFA;VENTOLIN HFA) 108 (90 Base) MCG/ACT inhaler, Inhale 2 puffs into the lungs every 6 (six) hours as needed for wheezing or shortness of breath., Disp: 1 Inhaler, Rfl: 2 .  albuterol (PROVENTIL) (2.5 MG/3ML) 0.083% nebulizer solution, Take 3 mLs (2.5 mg total) by nebulization every 6 (six) hours as needed for wheezing or shortness of breath., Disp: 150 mL, Rfl: 1 .  allopurinol (ZYLOPRIM) 100 MG tablet, Take 2 tablets (200 mg total) by mouth daily., Disp: 60 tablet, Rfl: 5 .  atorvastatin (LIPITOR) 40 MG tablet, take 1 TABLET BY MOUTH EVERY EVENING at 6pm, Disp: 28 tablet, Rfl: 2 .  Blood Glucose Monitoring Suppl (ACCU-CHEK AVIVA) device, Use as instructed1 times daily before meals, Disp: 1 each, Rfl: 0 .  diclofenac sodium (VOLTAREN) 1 % GEL, Apply 4 g topically 4 (four) times daily. 100/16=7 (Patient not taking: Reported on 06/02/2017), Disp: 100 g, Rfl: 1 .  digoxin (LANOXIN) 0.125 MG tablet, Take 0.5 tablets (0.0625 mg total) by mouth daily., Disp: 14 tablet, Rfl: 3 .  ergocalciferol (DRISDOL) 50000 units capsule, Take 1 capsule (50,000 Units total) by mouth once a week., Disp: 4 capsule, Rfl: 1 .  gabapentin (NEURONTIN) 300 MG capsule, Take 1 capsule (300 mg total) by mouth daily., Disp: 30 capsule, Rfl: 3 .  glipiZIDE (GLUCOTROL) 10 MG tablet, Take 1 tablet (  10 mg total) by mouth 2 (two) times daily before a meal., Disp: 60 tablet, Rfl: 5 .  glucose blood (ACCU-CHEK AVIVA) test strip, Use as instructed for 1 times daily testing of blood sugar, Disp: 100 each, Rfl: 12 .  hydrALAZINE (APRESOLINE) 25 MG tablet, take 1 TABLET BY MOUTH THREE TIMES DAILY every morning, noon,evening, Disp: 90 tablet, Rfl: 3 .  Insulin Glargine (LANTUS) 100  UNIT/ML Solostar Pen, Inject 38 Units into the skin daily at 10 pm., Disp: 4 pen, Rfl: 5 .  Insulin Pen Needle 31G X 5 MM MISC, Use as directed for once daily insulin injection (Patient not taking: Reported on 06/02/2017), Disp: 100 each, Rfl: 2 .  isosorbide mononitrate (IMDUR) 30 MG 24 hr tablet, Take 1 tablet (30 mg total) by mouth daily., Disp: 28 tablet, Rfl: 3 .  Lancet Devices (ACCU-CHEK SOFTCLIX) lancets, Use as instructed for once daily testing of blood sugar (Patient not taking: Reported on 06/02/2017), Disp: 1 each, Rfl: 0 .  metolazone (ZAROXOLYN) 2.5 MG tablet, Take 1 tablet once as directed by CHF clinic. (Patient not taking: Reported on 06/02/2017), Disp: 5 tablet, Rfl: 0 .  torsemide (DEMADEX) 20 MG tablet, Take 4 tablets (80 mg total) by mouth 2 (two) times daily., Disp: 240 tablet, Rfl: 3 .  traZODone (DESYREL) 100 MG tablet, Take 1 tablet (100 mg total) by mouth at bedtime as needed for sleep., Disp: 30 tablet, Rfl: 3 .  XARELTO 20 MG TABS tablet, take one 20mg  tablet daily by mouth, Disp: 28 tablet, Rfl: 2 Allergies  Allergen Reactions  . Pork-Derived Products Other (See Comments)    Religious preference     Social History   Socioeconomic History  . Marital status: Divorced    Spouse name: Not on file  . Number of children: 3  . Years of education: Not on file  . Highest education level: Not on file  Social Needs  . Financial resource strain: Not on file  . Food insecurity - worry: Not on file  . Food insecurity - inability: Not on file  . Transportation needs - medical: Not on file  . Transportation needs - non-medical: Not on file  Occupational History  . Occupation: disabled  Tobacco Use  . Smoking status: Current Every Day Smoker    Packs/day: 0.50    Years: 40.00    Pack years: 20.00    Types: Cigarettes  . Smokeless tobacco: Never Used  Substance and Sexual Activity  . Alcohol use: No    Alcohol/week: 0.0 oz  . Drug use: No  . Sexual activity: No   Other Topics Concern  . Not on file  Social History Narrative   Has an apartment with a roommate. He was living on the streets in 01-23-2013.  He reports that his father died in [place] in 01-23-13.  He is divorced.  He is no longer estranged from his son, but still from his daughter who lives locally.  Neither of his parents, nor any siblings have any history of CAD.    Physical Exam  Pulmonary/Chest: No respiratory distress.  Abdominal: He exhibits no distension. There is no tenderness.  Musculoskeletal: He exhibits no edema.  Skin: Skin is warm and dry. He is not diaphoretic.        Future Appointments  Date Time Provider Department Center  06/11/2017  2:00 PM [facility] PA/NP [facility] None  06/27/2017  9:00 AM [facility] 4 [facility]  06/27/2017  9:45 AM Randie Heinz,  Dennard Schaumann, MD [facility]  07/30/2017  1:30 PM Hoy Register, MD [facility] None    ATF pt CAO x4 sitting on the side of his bed smoking a cigarette.  Pt was seen last week at the heart clinic and had fluid retention, therefore he was advised to take a metolazone.  Pt stated that he took the two metolazone last week but his weight only decreased by one lb (per pt and scale @ [facility]).  Pt stated that he can't tell the difference in urinating, "only peed a little bit".  Erskine Squibb called yesterday and stated that pt's pill packs from the revision was still packaged wrong.  The am pills were on the pm side and vice versa.  This is the second mishap with his meds in less than a week, therefore we will change pharmacies next month.  I will also stop by [facility] to pick up pt's glucometer.  I took pt's pill packs back to [facility] and the pharmacist explained that the torsemide in the pack is 40mg  per tab instead of 20mg .  This information was not given to myself our the pt.  They did correct the time of day on the pack.    BP 124/70 (BP Location: Right Arm, Patient Position: Sitting, Cuff  Size: Normal)   Pulse 94   Resp 16   Wt 245 lb 6.4 oz (111.3 kg)   SpO2 97%   BMI 40.84 kg/m  CBG 189  Weight yesterday-246 Last visit weight-248    Cartier Washko, EMT Paramedic 06/03/2017    ACTION: Home visit completed Next visit planned for today

## 2017-06-03 NOTE — Progress Notes (Signed)
I dropped pt's meds off along with his new glucometer/test strips/needles and gabapentin from the pharmacy.  Its too soon to get the lantus refilled per the pharmacist.

## 2017-06-04 ENCOUNTER — Ambulatory Visit: Payer: Medicare Other | Admitting: Family Medicine

## 2017-06-05 ENCOUNTER — Telehealth: Payer: Self-pay | Admitting: Internal Medicine

## 2017-06-05 ENCOUNTER — Telehealth: Payer: Self-pay

## 2017-06-05 DIAGNOSIS — N183 Chronic kidney disease, stage 3 (moderate): Secondary | ICD-10-CM | POA: Diagnosis not present

## 2017-06-05 DIAGNOSIS — I2510 Atherosclerotic heart disease of native coronary artery without angina pectoris: Secondary | ICD-10-CM | POA: Diagnosis not present

## 2017-06-05 DIAGNOSIS — I5023 Acute on chronic systolic (congestive) heart failure: Secondary | ICD-10-CM | POA: Diagnosis not present

## 2017-06-05 DIAGNOSIS — I130 Hypertensive heart and chronic kidney disease with heart failure and stage 1 through stage 4 chronic kidney disease, or unspecified chronic kidney disease: Secondary | ICD-10-CM | POA: Diagnosis not present

## 2017-06-05 DIAGNOSIS — I482 Chronic atrial fibrillation: Secondary | ICD-10-CM | POA: Diagnosis not present

## 2017-06-05 DIAGNOSIS — E1122 Type 2 diabetes mellitus with diabetic chronic kidney disease: Secondary | ICD-10-CM | POA: Diagnosis not present

## 2017-06-05 NOTE — Telephone Encounter (Signed)
New message     Patient was returning call from [place] about his device

## 2017-06-05 NOTE — Telephone Encounter (Signed)
Patient was called and informed of lab results. 

## 2017-06-05 NOTE — Telephone Encounter (Signed)
Spoke w/ pt and he informed me that he does not have a monitor. Pt agreed to an appt w/ device clinic on 06-19-17 at 3:00 PM. Called industry rep to come to appt and bring pt a new monitor.

## 2017-06-11 ENCOUNTER — Emergency Department (HOSPITAL_COMMUNITY)
Admission: EM | Admit: 2017-06-11 | Discharge: 2017-06-11 | Disposition: A | Discharge status: Home / Self Care | Payer: Medicare Other | Attending: Emergency Medicine | Admitting: Emergency Medicine

## 2017-06-11 ENCOUNTER — Inpatient Hospital Stay (HOSPITAL_COMMUNITY): Admission: RE | Admit: 2017-06-11 | Payer: Medicare Other | Source: Ambulatory Visit

## 2017-06-11 ENCOUNTER — Encounter (HOSPITAL_COMMUNITY): Payer: Self-pay | Admitting: Emergency Medicine

## 2017-06-11 ENCOUNTER — Telehealth (HOSPITAL_COMMUNITY): Payer: Self-pay

## 2017-06-11 ENCOUNTER — Emergency Department (HOSPITAL_COMMUNITY): Payer: Medicare Other

## 2017-06-11 DIAGNOSIS — I110 Hypertensive heart disease with heart failure: Secondary | ICD-10-CM | POA: Insufficient documentation

## 2017-06-11 DIAGNOSIS — I2510 Atherosclerotic heart disease of native coronary artery without angina pectoris: Secondary | ICD-10-CM | POA: Insufficient documentation

## 2017-06-11 DIAGNOSIS — Z79899 Other long term (current) drug therapy: Secondary | ICD-10-CM | POA: Diagnosis not present

## 2017-06-11 DIAGNOSIS — R0602 Shortness of breath: Secondary | ICD-10-CM | POA: Diagnosis not present

## 2017-06-11 DIAGNOSIS — E119 Type 2 diabetes mellitus without complications: Secondary | ICD-10-CM | POA: Insufficient documentation

## 2017-06-11 DIAGNOSIS — R069 Unspecified abnormalities of breathing: Secondary | ICD-10-CM | POA: Diagnosis not present

## 2017-06-11 DIAGNOSIS — I509 Heart failure, unspecified: Secondary | ICD-10-CM | POA: Diagnosis not present

## 2017-06-11 DIAGNOSIS — F17210 Nicotine dependence, cigarettes, uncomplicated: Secondary | ICD-10-CM | POA: Insufficient documentation

## 2017-06-11 LAB — CBC
HEMATOCRIT: 34 % — AB (ref 39.0–52.0)
Hemoglobin: 10.4 g/dL — ABNORMAL LOW (ref 13.0–17.0)
MCH: 24.1 pg — ABNORMAL LOW (ref 26.0–34.0)
MCHC: 30.6 g/dL (ref 30.0–36.0)
MCV: 78.9 fL (ref 78.0–100.0)
PLATELETS: 231 10*3/uL (ref 150–400)
RBC: 4.31 MIL/uL (ref 4.22–5.81)
RDW: 18.3 % — AB (ref 11.5–15.5)
WBC: 11.9 10*3/uL — AB (ref 4.0–10.5)

## 2017-06-11 LAB — BASIC METABOLIC PANEL
Anion gap: 14 (ref 5–15)
BUN: 20 mg/dL (ref 6–20)
CO2: 23 mmol/L (ref 22–32)
CREATININE: 1.18 mg/dL (ref 0.61–1.24)
Calcium: 7.9 mg/dL — ABNORMAL LOW (ref 8.9–10.3)
Chloride: 94 mmol/L — ABNORMAL LOW (ref 101–111)
Glucose, Bld: 231 mg/dL — ABNORMAL HIGH (ref 65–99)
POTASSIUM: 4 mmol/L (ref 3.5–5.1)
SODIUM: 131 mmol/L — AB (ref 135–145)

## 2017-06-11 LAB — BRAIN NATRIURETIC PEPTIDE: B Natriuretic Peptide: 225.4 pg/mL — ABNORMAL HIGH (ref 0.0–100.0)

## 2017-06-11 LAB — I-STAT TROPONIN, ED: Troponin i, poc: 0.01 ng/mL (ref 0.00–0.08)

## 2017-06-11 MED ORDER — FUROSEMIDE 10 MG/ML IJ SOLN
80.0000 mg | Freq: Once | INTRAMUSCULAR | Status: AC
  Administered 2017-06-11: 80 mg via INTRAVENOUS
  Filled 2017-06-11: qty 8

## 2017-06-11 NOTE — ED Notes (Signed)
Gave patient [place] sandwich meal and water.

## 2017-06-11 NOTE — Discharge Instructions (Signed)
Your heart doctors will contact you to arrange a follow-up appointment.  Continue taking your usual medications including tonight and tomorrow morning.  Remember that you can always use your Zaroxolyn (metolazone) once a day if needed to help your swelling and breathing.  It will probably help to take a dose of Zaroxolyn tomorrow morning with your morning torsemide.

## 2017-06-11 NOTE — ED Notes (Signed)
Pt refused to wear SPO2 or BP cuff

## 2017-06-11 NOTE — ED Triage Notes (Signed)
Arrived via EMS developed shortness of breath and abdominal distention. History of CHF and had an appointment today however called EMS due to worsening shortness of breath on exertion. Alert answering and following commands appropriate.

## 2017-06-11 NOTE — Telephone Encounter (Signed)
Pt called while I was picking up his meds and stated that he just called 911 because he can't breathe.  I advised pt that he already has an appointment with the heart failure clinic @2 .  Pt stated that he didn't want to wait and he refused me calling to see if he could get seen sooner at the clinic.

## 2017-06-11 NOTE — ED Provider Notes (Addendum)
[facility] EMERGENCY DEPARTMENT Provider Note   CSN: [id number] Arrival date & time: 06/11/17  1253     History   Chief Complaint Chief Complaint  Patient presents with  . Shortness of Breath    HPI Jacob [name] is a 62 y.o. male.  He presents for evaluation of swelling in his abdomen and shortness of breath.  Symptoms gradual in onset over the last several days.  He is taking his usual medicines without relief.  He was due to follow-up in the congestive heart failure clinic today, but did not want to wait for the 2 PM appointment so came here.  He denies chest pain, back pain, weakness or dizziness.  He is taking his usual medication.  He came here by EMS for evaluation.  He denies cough, nausea, vomiting, focal weakness or paresthesia.  He was able to eat breakfast this morning but has not eaten lunch and is currently hungry.  There are no other known modifying factors.   HPI  Past Medical History:  Diagnosis Date  . Atrial fibrillation (HCC)    RVR 10/2014  . Automatic implantable cardioverter-defibrillator in situ   . CAD (coronary artery disease) Sept 2013   s/p cardiac cath showing occlusion of small RCA with collaterals  . CHF (congestive heart failure) (HCC)    20 to 25 % EF and RV dysfunction by 07/2014 echo   . Chronic anticoagulation    on xarelto.   . High cholesterol   . Hypertension   . Myocardial infarction (HCC) 2014  . Noncompliance    homelessness contributing.   . Peripheral arterial disease (HCC)   . Type II diabetes mellitus [facility])     Patient Active Problem List   Diagnosis Date Noted  . Exertional angina (HCC)   . Chronic systolic heart failure (HCC) 12/17/2016  . Hyperglycemia 12/02/2016  . Osteoarthritis of right knee 12/02/2016  . Congestive heart disease (HCC) 01/01/2016  . Diabetic neuropathy (HCC) 11/28/2015  . Ascites 11/09/2015  . CHF (congestive heart failure) (HCC) 11/09/2015  . Insomnia 04/18/2015  . Claudication  of right lower extremity (HCC) 04/07/2015  . Cardiomyopathy, ischemic 04/07/2015  . Chronic anticoagulation 03/30/2015  . Cardiorenal syndrome with renal failure   . SOB (shortness of breath)   . Morbid obesity due to excess calories (HCC)   . Helicobacter pylori ab+ 12/12/2014  . Calf pain   . Duodenal ulcer with hemorrhage   . Hemorrhagic shock (HCC)   . Cardiogenic shock (HCC)   . Diabetes mellitus (HCC)   . Coronary artery disease involving native coronary artery of native heart without angina pectoris   . Atrial fibrillation with rapid ventricular response (HCC) 10/29/2014  . Chest pain   . Atherosclerosis of native arteries of extremity with intermittent claudication (HCC) 09/06/2014  . Atrial fibrillation with controlled ventricular response (HCC) 08/03/2014  . Bacteremia   . DM (diabetes mellitus), type 2 with peripheral vascular complications (HCC) 08/01/2014  . Anemia of chronic disease 08/01/2014  . PVD (peripheral vascular disease) (HCC) 10/29/2013  . Tobacco abuse 05/13/2013  . AF (atrial fibrillation) (HCC) 02/11/2013  . ICD - in place- BS May 2014 [facility] 02/11/2013  . Microcytic anemia 01/11/2013  . CAD- s/p CABG July 2014 [facility] 12/04/2011  . Noncompliance 12/01/2011  . Essential hypertension 10/30/2006    Past Surgical History:  Procedure Laterality Date  . CARDIAC CATHETERIZATION  09/2012  . CORONARY ANGIOPLASTY WITH STENT PLACEMENT  11/2011   "1"  .  CORONARY ARTERY BYPASS GRAFT  09/2012   2 vessels per patient [name]   . ESOPHAGOGASTRODUODENOSCOPY N/A 11/28/2014   Procedure: ESOPHAGOGASTRODUODENOSCOPY (EGD);  Surgeon: Beverley Fiedler, MD;  Location: [facility] ENDOSCOPY;  Service: Endoscopy;  Laterality: N/A;  . ILIAC ARTERY STENT Right 08/30/2013  . IMPLANTABLE CARDIOVERTER DEFIBRILLATOR IMPLANT     [place] in 07/2012; [employer]  . LEFT AND RIGHT HEART CATHETERIZATION WITH CORONARY ANGIOGRAM N/A 12/02/2011   Procedure: LEFT AND RIGHT HEART  CATHETERIZATION WITH CORONARY ANGIOGRAM;  Surgeon: Kathleene Hazel, MD;  Location: [facility] CATH LAB;  Service: Cardiovascular;  Laterality: N/A;  . LOWER EXTREMITY ANGIOGRAM N/A 08/30/2013   Procedure: LOWER EXTREMITY ANGIOGRAM;  Surgeon: Runell Gess, MD;  Location: [facility] CATH LAB;  Service: Cardiovascular;  Laterality: N/A;  . LOWER EXTREMITY ANGIOGRAM N/A 12/02/2013   Procedure: LOWER EXTREMITY ANGIOGRAM;  Surgeon: Runell Gess, MD;  Location: [facility] CATH LAB;  Service: Cardiovascular;  Laterality: N/A;  . RIGHT/LEFT HEART CATH AND CORONARY/GRAFT ANGIOGRAPHY N/A 04/23/2017   Procedure: RIGHT/LEFT HEART CATH AND CORONARY/GRAFT ANGIOGRAPHY;  Surgeon: Dolores Patty, MD;  Location: [facility] INVASIVE CV LAB;  Service: Cardiovascular;  Laterality: N/A;       Home Medications    Prior to Admission medications   Medication Sig Start Date End Date Taking? Authorizing Provider  albuterol (PROVENTIL HFA;VENTOLIN HFA) 108 (90 Base) MCG/ACT inhaler Inhale 2 puffs into the lungs every 6 (six) hours as needed for wheezing or shortness of breath. 03/29/15  Yes Hoy Register, MD  allopurinol (ZYLOPRIM) 100 MG tablet Take 2 tablets (200 mg total) by mouth daily. 01/21/17  Yes Bensimhon, Bevelyn Buckles, MD  atorvastatin (LIPITOR) 40 MG tablet take 1 TABLET BY MOUTH EVERY EVENING at 6pm 05/19/17  Yes Newlin, Enobong, MD  digoxin (LANOXIN) 0.125 MG tablet Take 0.5 tablets (0.0625 mg total) by mouth daily. 04/04/17  Yes Bensimhon, Bevelyn Buckles, MD  gabapentin (NEURONTIN) 300 MG capsule Take 1 capsule (300 mg total) by mouth daily. 06/02/17  Yes Hoy Register, MD  glipiZIDE (GLUCOTROL) 10 MG tablet Take 1 tablet (10 mg total) by mouth 2 (two) times daily before a meal. 02/11/17  Yes Newlin, Enobong, MD  hydrALAZINE (APRESOLINE) 25 MG tablet take 1 TABLET BY MOUTH THREE TIMES DAILY every morning, noon,evening Patient taking differently: Take 25 mg by mouth 3 (three) times daily. take 1 TABLET BY MOUTH THREE TIMES DAILY every  morning, noon,evening 04/24/17  Yes Tillery, Mariam Dollar, PA-C  Insulin Glargine (LANTUS) 100 UNIT/ML Solostar Pen Inject 38 Units into the skin daily at 10 pm. 05/02/17  Yes Hoy Register, MD  isosorbide mononitrate (IMDUR) 30 MG 24 hr tablet Take 1 tablet (30 mg total) by mouth daily. 04/04/17  Yes Bensimhon, Bevelyn Buckles, MD  torsemide (DEMADEX) 20 MG tablet Take 4 tablets (80 mg total) by mouth 2 (two) times daily. 03/26/17  Yes Bensimhon, Bevelyn Buckles, MD  XARELTO 20 MG TABS tablet take one 20mg  tablet daily by mouth Patient taking differently: take one 20mg  tablet daily by mouth in the evening 05/22/17  Yes Bensimhon, Bevelyn Buckles, MD  ACCU-CHEK [facility] LANCETS lancets Use for once daily testing of blood sugar Patient not taking: Reported on 06/11/2017 06/02/17   Hoy Register, MD  albuterol (PROVENTIL) (2.5 MG/3ML) 0.083% nebulizer solution Take 3 mLs (2.5 mg total) by nebulization every 6 (six) hours as needed for wheezing or shortness of breath. Patient not taking: Reported on 06/11/2017 04/18/15   Hoy Register, MD  Blood Glucose Monitoring Suppl (ACCU-CHEK AVIVA)  device Use as instructed1 times daily before meals Patient not taking: Reported on 06/11/2017 06/02/17   Hoy Register, MD  diclofenac sodium (VOLTAREN) 1 % GEL Apply 4 g topically 4 (four) times daily. 100/16=7 Patient not taking: Reported on 06/02/2017 02/19/17   Hoy Register, MD  ergocalciferol (DRISDOL) 50000 units capsule Take 1 capsule (50,000 Units total) by mouth once a week. 06/03/17   Hoy Register, MD  glucose blood (ACCU-CHEK AVIVA) test strip Use as instructed for 1 times daily testing of blood sugar Patient not taking: Reported on 06/11/2017 06/02/17   Hoy Register, MD  Lancet Devices [facility]) lancets Use as instructed for once daily testing of blood sugar Patient not taking: Reported on 06/02/2017 11/28/15   Hoy Register, MD  metolazone (ZAROXOLYN) 2.5 MG tablet Take 1 tablet once as directed by CHF  clinic. Patient not taking: Reported on 06/02/2017 05/28/17   Graciella Freer, PA-C  traZODone (DESYREL) 100 MG tablet Take 1 tablet (100 mg total) by mouth at bedtime as needed for sleep. Patient not taking: Reported on 06/11/2017 05/02/17   Hoy Register, MD    Family History Family History  Problem Relation Age of Onset  . Diabetes Mother     Social History Social History   Tobacco Use  . Smoking status: Current Every Day Smoker    Packs/day: 0.50    Years: 40.00    Pack years: 20.00    Types: Cigarettes  . Smokeless tobacco: Never Used  Substance Use Topics  . Alcohol use: No    Alcohol/week: 0.0 oz  . Drug use: No     Allergies   Pork-derived products   Review of Systems Review of Systems  All other systems reviewed and are negative.    Physical Exam Updated Vital Signs BP 114/69   Pulse (!) 103   Temp 98.4 F (36.9 C) (Oral)   Resp (!) 25   Ht 5\' 5"  (1.651 m)   Wt 111.1 kg (245 lb)   SpO2 96%   BMI 40.77 kg/m   Physical Exam  Constitutional: He is oriented to person, place, and time. He appears well-developed. He does not appear ill.  Morbidly obese  HENT:  Head: Normocephalic and atraumatic.  Right Ear: External ear normal.  Left Ear: External ear normal.  Eyes: Conjunctivae and EOM are normal. Pupils are equal, round, and reactive to light.  Neck: Normal range of motion and phonation normal. Neck supple.  Cardiovascular: Normal rate, regular rhythm and normal heart sounds.  Pulmonary/Chest: Effort normal and breath sounds normal. No stridor. No respiratory distress. He has no wheezes. He has no rales. He exhibits no bony tenderness.  Abdominal: Soft. He exhibits distension. He exhibits no mass. There is no tenderness. There is no rebound and no guarding.  Musculoskeletal: Normal range of motion. He exhibits edema (2+ lower legs bilaterally).  Neurological: He is alert and oriented to person, place, and time. No cranial nerve deficit or  sensory deficit. He exhibits normal muscle tone. Coordination normal.  Skin: Skin is warm, dry and intact.  Psychiatric: He has a normal mood and affect. His behavior is normal. Judgment and thought content normal.  Nursing note and vitals reviewed.    ED Treatments / Results  Labs (all labs ordered are listed, but only abnormal results are displayed) Labs Reviewed  BASIC METABOLIC PANEL - Abnormal; Notable for the following components:      Result Value   Sodium 131 (*)    Chloride 94 (*)  Glucose, Bld 231 (*)    Calcium 7.9 (*)    All other components within normal limits  CBC - Abnormal; Notable for the following components:   WBC 11.9 (*)    Hemoglobin 10.4 (*)    HCT 34.0 (*)    MCH 24.1 (*)    RDW 18.3 (*)    All other components within normal limits  BRAIN NATRIURETIC PEPTIDE - Abnormal; Notable for the following components:   B Natriuretic Peptide 225.4 (*)    All other components within normal limits  I-STAT TROPONIN, ED    EKG  EKG Interpretation  Date/Time:  Wednesday June 11 2017 13:02:59 EDT Ventricular Rate:  107 PR Interval:    QRS Duration: 112 QT Interval:  387 QTC Calculation: 517 R Axis:   -75 Text Interpretation:  Atrial fibrillation Left anterior fascicular block Low voltage, precordial leads Probable anterolateral infarct, old Prolonged QT interval Since last tracing QT has lengthened Confirmed by Mancel Bale [phone]) on 06/11/2017 1:27:48 PM       Radiology Dg Chest 2 View  Result Date: 06/11/2017 CLINICAL DATA:  Shortness of breath. EXAM: CHEST - 2 VIEW COMPARISON:  CT chest dated April 22, 2017. Chest x-ray dated March 06, 2017. FINDINGS: Unchanged single lead left chest wall pacemaker. Stable cardiomegaly status post CABG. Pulmonary vascular congestion and mild diffuse interstitial thickening. Small right pleural effusion. No consolidation or pneumothorax. No acute osseous abnormality. IMPRESSION: Persistent cardiomegaly with  vascular congestion, mild interstitial edema, and small right pleural effusion. Electronically Signed   By: Obie Dredge M.D.   On: 06/11/2017 13:42    Procedures .Critical Care Performed by: Mancel Bale, MD Authorized by: Mancel Bale, MD   Critical care provider statement:    Critical care time (minutes):  35   Critical care start time:  06/11/2017 5:05 PM   Critical care end time:  06/11/2017 7:35 PM   Critical care time was exclusive of:  Separately billable procedures and treating other patients   Critical care was time spent personally by me on the following activities:  Blood draw for specimens, development of treatment plan with patient or surrogate, discussions with consultants, evaluation of patient's response to treatment, examination of patient, obtaining history from patient or surrogate, ordering and performing treatments and interventions, ordering and review of laboratory studies, pulse oximetry, re-evaluation of patient's condition, review of old charts and ordering and review of radiographic studies   (including critical care time)  Medications Ordered in ED Medications  furosemide (LASIX) injection 80 mg (80 mg Intravenous Given 06/11/17 1900)     Initial Impression / Assessment and Plan / ED Course  I have reviewed the triage vital signs and the nursing notes.  Pertinent labs & imaging results that were available during my care of the patient were reviewed by me and considered in my medical decision making (see chart for details).  Clinical Course as of Jun 11 1924  Wed Jun 11, 2017  1713 Evaluation for decompensated congestive heart failure: BNP near baseline at 225, sodium low 131, potassium normal 4.0, chloride low 94, glucose high at 231.  CBC with the hemoglobin low 10.4, white count 11.9.  Troponin I is normal.  Chest x-ray with vascular congestion, mild pulmonary edema and small right pleural effusion.  EKG does not indicate ischemia or infarct.  [EW]   1926 I discussed the case with the on-call cardiologist, who has reviewed the patient's chart.  They are in agreement with dosing with Lasix, as a  one-time treatment for congestive heart failure.  I also recommend resuming usual medications, and tomorrow morning take a dose of Zaroxolyn with morning medications.  The cardiology service will contact him to schedule a follow-up appointment.  [EW]    Clinical Course User Index [EW] Mancel Bale, MD     Patient Vitals for the past 24 hrs:  BP Temp Temp src Pulse Resp SpO2 Height Weight  06/11/17 1500 114/69 - - (!) 103 (!) 25 96 % - -  06/11/17 1430 130/75 - - (!) 101 (!) 21 95 % - -  06/11/17 1415 116/62 - - 91 (!) 22 95 % - -  06/11/17 1400 136/75 - - 94 20 96 % - -  06/11/17 1355 134/80 - - (!) 102 (!) 21 96 % - -  06/11/17 1335 131/67 - - 95 18 98 % - -  06/11/17 1309 126/65 98.4 F (36.9 C) Oral (!) 111 20 96 % 5\' 5"  (1.651 m) 111.1 kg (245 lb)    5:18 PM Reevaluation with update and discussion. After initial assessment and treatment, an updated evaluation reveals he is sitting up and comfortable and has no further complaints.  He would like to go home. Mancel Bale   5:15 PM-consult requested from cardiology to discuss treatment plan  CHA2DS2/VAS Stroke Risk Points      4 >= 2 Points: High Risk  1 - 1.99 Points: Medium Risk  0 Points: Low Risk    This is the only CHA2DS2/VAS Stroke Risk Points available for the past  year.:  Change: N/A     Details    This score determines the patient's risk of having a stroke if the  patient has atrial fibrillation.       Points Metrics  1 Has Congestive Heart Failure:  Yes   1 Has Vascular Disease:  Yes   1 Has Hypertension:  Yes   0 Age:  34   1 Has Diabetes:  Yes   0 Had Stroke:  No  Had TIA:  No  Had thromboembolism:  No   0 Male:  No         MDM-shortness of breath with abdominal swelling, likely secondary to mild exacerbation of chronic COPD.  Cardiac, done September  2018 with EF 30-35%.  He has chronic atrial fibrillation and that is stable.  Doubt ACS, pneumonia, metabolic instability or impending vascular collapse.  Patient treated with IV Lasix in ED, and desires to go home.  Telephone consultation with cardiology for disposition planning and follow-up.   Nursing Notes Reviewed/ Care Coordinated Applicable Imaging Reviewed Interpretation of Laboratory Data incorporated into ED treatment  The patient appears reasonably screened and/or stabilized for discharge and I doubt any other medical condition or other [facility] requiring further screening, evaluation, or treatment in the ED at this time prior to discharge.  Plan: Home Medications-continue usual medications including daily Zaroxolyn as needed; Home Treatments-continue to avoid salt and elevate legs as needed.; return here if the recommended treatment, does not improve the symptoms; Recommended follow up-CHF clinic follow-up as soon as possible.    Final Clinical Impressions(s) / ED Diagnoses   Final diagnoses:  Acute on chronic congestive heart failure, unspecified heart failure type [facility])    ED Discharge Orders    None       Mancel Bale, MD 06/11/17 Serena Croissant    Mancel Bale, MD 06/17/17 1137

## 2017-06-11 NOTE — ED Notes (Signed)
Patient requested:Turkey Sandwich/Coke-per Dr. Sedonia Small by Marylene Land

## 2017-06-12 ENCOUNTER — Other Ambulatory Visit (HOSPITAL_COMMUNITY): Payer: Self-pay

## 2017-06-12 NOTE — Progress Notes (Signed)
Paramedicine Encounter    Patient ID: Jacob [name], male    DOB: [birth year], 62 y.o.   MRN: [id number]    Patient Care Team: Hoy Register, MD as PCP - General (Family Medicine) Clarisa Schools, RN as Registered Nurse Jena Gauss, Gerrit Friends, MD as Consulting Physician (Gastroenterology) Pleasant, Dennard Schaumann, RN as [facility] Care Management  Patient Active Problem List   Diagnosis Date Noted  . Exertional angina (HCC)   . Chronic systolic heart failure (HCC) 12/17/2016  . Hyperglycemia 12/02/2016  . Osteoarthritis of right knee 12/02/2016  . Congestive heart disease (HCC) 01/01/2016  . Diabetic neuropathy (HCC) 11/28/2015  . Ascites 11/09/2015  . CHF (congestive heart failure) (HCC) 11/09/2015  . Insomnia 04/18/2015  . Claudication of right lower extremity (HCC) 04/07/2015  . Cardiomyopathy, ischemic 04/07/2015  . Chronic anticoagulation 03/30/2015  . Cardiorenal syndrome with renal failure   . SOB (shortness of breath)   . Morbid obesity due to excess calories (HCC)   . Helicobacter pylori ab+ 12/12/2014  . Calf pain   . Duodenal ulcer with hemorrhage   . Hemorrhagic shock (HCC)   . Cardiogenic shock (HCC)   . Diabetes mellitus (HCC)   . Coronary artery disease involving native coronary artery of native heart without angina pectoris   . Atrial fibrillation with rapid ventricular response (HCC) 10/29/2014  . Chest pain   . Atherosclerosis of native arteries of extremity with intermittent claudication (HCC) 09/06/2014  . Atrial fibrillation with controlled ventricular response (HCC) 08/03/2014  . Bacteremia   . DM (diabetes mellitus), type 2 with peripheral vascular complications (HCC) 08/01/2014  . Anemia of chronic disease 08/01/2014  . PVD (peripheral vascular disease) (HCC) 10/29/2013  . Tobacco abuse 05/13/2013  . AF (atrial fibrillation) (HCC) 02/11/2013  . ICD - in place- BS May 2014 [facility] 02/11/2013  . Microcytic anemia 01/11/2013  . 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 Noncompliance 12/01/2011  . Essential hypertension 10/30/2006    Current Outpatient Medications:  .  ACCU-CHEK SOFTCLIX LANCETS lancets, Use for once daily testing of blood sugar (Patient not taking: Reported on 06/11/2017), Disp: 100 each, Rfl: 12 .  albuterol (PROVENTIL HFA;VENTOLIN HFA) 108 (90 Base) MCG/ACT inhaler, Inhale 2 puffs into the lungs every 6 (six) hours as needed for wheezing or shortness of breath., Disp: 1 Inhaler, Rfl: 2 .  albuterol (PROVENTIL) (2.5 MG/3ML) 0.083% nebulizer solution, Take 3 mLs (2.5 mg total) by nebulization every 6 (six) hours as needed for wheezing or shortness of breath. (Patient not taking: Reported on 06/11/2017), Disp: 150 mL, Rfl: 1 .  allopurinol (ZYLOPRIM) 100 MG tablet, Take 2 tablets (200 mg total) by mouth daily., Disp: 60 tablet, Rfl: 5 .  atorvastatin (LIPITOR) 40 MG tablet, take 1 TABLET BY MOUTH EVERY EVENING at 6pm, Disp: 28 tablet, Rfl: 2 .  Blood Glucose Monitoring Suppl (ACCU-CHEK AVIVA) device, Use as instructed1 times daily before meals (Patient not taking: Reported on 06/11/2017), Disp: 1 each, Rfl: 0 .  diclofenac sodium (VOLTAREN) 1 % GEL, Apply 4 g topically 4 (four) times daily. 100/16=7 (Patient not taking: Reported on 06/02/2017), Disp: 100 g, Rfl: 1 .  digoxin (LANOXIN) 0.125 MG tablet, Take 0.5 tablets (0.0625 mg total) by mouth daily., Disp: 14 tablet, Rfl: 3 .  ergocalciferol (DRISDOL) 50000 units capsule, Take 1 capsule (50,000 Units total) by mouth once a week., Disp: 4 capsule, Rfl: 1 .  gabapentin (NEURONTIN) 300 MG capsule, Take 1 capsule (300 mg total)  by mouth daily., Disp: 30 capsule, Rfl: 3 .  glipiZIDE (GLUCOTROL) 10 MG tablet, Take 1 tablet (10 mg total) by mouth 2 (two) times daily before a meal., Disp: 60 tablet, Rfl: 5 .  glucose blood (ACCU-CHEK AVIVA) test strip, Use as instructed for 1 times daily testing of blood sugar (Patient not taking: Reported on 06/11/2017), Disp: 100 each, Rfl:  12 .  hydrALAZINE (APRESOLINE) 25 MG tablet, take 1 TABLET BY MOUTH THREE TIMES DAILY every morning, noon,evening (Patient taking differently: Take 25 mg by mouth 3 (three) times daily. take 1 TABLET BY MOUTH THREE TIMES DAILY every morning, noon,evening), Disp: 90 tablet, Rfl: 3 .  Insulin Glargine (LANTUS) 100 UNIT/ML Solostar Pen, Inject 38 Units into the skin daily at 10 pm., Disp: 4 pen, Rfl: 5 .  isosorbide mononitrate (IMDUR) 30 MG 24 hr tablet, Take 1 tablet (30 mg total) by mouth daily., Disp: 28 tablet, Rfl: 3 .  Lancet Devices (ACCU-CHEK SOFTCLIX) lancets, Use as instructed for once daily testing of blood sugar (Patient not taking: Reported on 06/02/2017), Disp: 1 each, Rfl: 0 .  metolazone (ZAROXOLYN) 2.5 MG tablet, Take 1 tablet once as directed by CHF clinic. (Patient not taking: Reported on 06/02/2017), Disp: 5 tablet, Rfl: 0 .  torsemide (DEMADEX) 20 MG tablet, Take 4 tablets (80 mg total) by mouth 2 (two) times daily., Disp: 240 tablet, Rfl: 3 .  traZODone (DESYREL) 100 MG tablet, Take 1 tablet (100 mg total) by mouth at bedtime as needed for sleep. (Patient not taking: Reported on 06/11/2017), Disp: 30 tablet, Rfl: 3 .  XARELTO 20 MG TABS tablet, take one 20mg  tablet daily by mouth (Patient taking differently: take one 20mg  tablet daily by mouth in the evening), Disp: 28 tablet, Rfl: 2 Allergies  Allergen Reactions  . Pork-Derived Products Other (See Comments)    Religious preference     Social History   Socioeconomic History  . Marital status: Divorced    Spouse name: Not on file  . Number of children: 3  . Years of education: Not on file  . Highest education level: Not on file  Occupational History  . Occupation: disabled  Social Needs  . Financial resource strain: Not on file  . Food insecurity:    Worry: Not on file    Inability: Not on file  . Transportation needs:    Medical: Not on file    Non-medical: Not on file  Tobacco Use  . Smoking status: Current Every  Day Smoker    Packs/day: 0.50    Years: 40.00    Pack years: 20.00    Types: Cigarettes  . Smokeless tobacco: Never Used  Substance and Sexual Activity  . Alcohol use: No    Alcohol/week: 0.0 oz  . Drug use: No  . Sexual activity: Never  Lifestyle  . Physical activity:    Days per week: Not on file    Minutes per session: Not on file  . Stress: Not on file  Relationships  . Social connections:    Talks on phone: Not on file    Gets together: Not on file    Attends religious service: Not on file    Active member of club or organization: Not on file    Attends meetings of clubs or organizations: Not on file    Relationship status: Not on file  . Intimate partner violence:    Fear of current or ex partner: Not on file    Emotionally abused: Not  on file    Physically abused: Not on file    Forced sexual activity: Not on file  Other Topics Concern  . Not on file  Social History Narrative   Has an apartment with a roommate. He was living on the streets in January 09, 2013.  He reports that his father died in [place] in Jan 09, 2013.  He is divorced.  He is no longer estranged from his son, but still from his daughter who lives locally.  Neither of his parents, nor any siblings have any history of CAD.    Physical Exam  Pulmonary/Chest: No respiratory distress.  Abdominal: He exhibits no distension.  Musculoskeletal: He exhibits no edema.  Skin: Skin is dry. He is not diaphoretic.        Future Appointments  Date Time Provider Department Center  06/19/2017  3:00 PM [facility] DEVICE 1 [facility]  06/27/2017  9:00 AM [facility] HS VASC 4 - SS [facility]  06/27/2017  9:45 AM Maeola Harman, MD [facility]  07/30/2017  1:30 PM Hoy Register, MD [facility] None    ATF pt CAO x4 sitting on the edge of the bed, house smells of cigarettes.  He was seen in the emergency room yesterday for SOB and treated for CHF.  The discharge instructions advised pt to take his meds  as normal and to take metolazone if needed.  I  Advised pt to call the heart failure clinic when he feels the need to take a metolazone and he agreed.  Theres about 5 days in a row in pts pill pack that he missed his medications.  When asked why he didn't take them, he stated because they were "wrong".  The pill pack had been revised and pt was made aware of the revision by myself when I returned them to him last visit.  I gave pt's his new pill packs today which I verified yesterday and today.  Yesterday when I picked them up, they were missing hydralazine and glipizide.  I picked them up today and they were corrected.  In the pill bag pt had a new bottle of metolazone with 5 pills in it.  pts vitals noted.  I asked pt about his food choices and how much he's drinking.  Pt stated that he's drinking about 3 20oz bottles of water and a can of soda in a day.  I advised him that his fluid intake is too high or he's eating foods high in sodium along with not taking his medications.  Pt stated that he will work on these areas.  It's unknown if his aide is still coming. The last time he stated that she only comes to get her paper work signed and leaves.     BP 110/64 (BP Location: Right Arm, Patient Position: Sitting, Cuff Size: Normal)   Pulse 89   Resp 16   Wt 254 lb 6.4 oz (115.4 kg)   SpO2 97%   BMI 42.33 kg/m  cbg 177  Last visit weight-245    Clydell Sposito, EMT Paramedic 06/12/2017    ACTION: Home visit completed Next visit planned for 3 weeks

## 2017-06-20 ENCOUNTER — Other Ambulatory Visit: Payer: Self-pay | Admitting: Family Medicine

## 2017-06-20 ENCOUNTER — Other Ambulatory Visit (HOSPITAL_COMMUNITY): Payer: Self-pay | Admitting: Internal Medicine

## 2017-06-23 ENCOUNTER — Other Ambulatory Visit: Payer: Self-pay

## 2017-06-23 DIAGNOSIS — I739 Peripheral vascular disease, unspecified: Secondary | ICD-10-CM

## 2017-06-24 DIAGNOSIS — N183 Chronic kidney disease, stage 3 (moderate): Secondary | ICD-10-CM | POA: Diagnosis not present

## 2017-06-24 DIAGNOSIS — I5023 Acute on chronic systolic (congestive) heart failure: Secondary | ICD-10-CM | POA: Diagnosis not present

## 2017-06-24 DIAGNOSIS — I130 Hypertensive heart and chronic kidney disease with heart failure and stage 1 through stage 4 chronic kidney disease, or unspecified chronic kidney disease: Secondary | ICD-10-CM | POA: Diagnosis not present

## 2017-06-24 DIAGNOSIS — I482 Chronic atrial fibrillation: Secondary | ICD-10-CM | POA: Diagnosis not present

## 2017-06-24 DIAGNOSIS — I2510 Atherosclerotic heart disease of native coronary artery without angina pectoris: Secondary | ICD-10-CM | POA: Diagnosis not present

## 2017-06-24 DIAGNOSIS — E1122 Type 2 diabetes mellitus with diabetic chronic kidney disease: Secondary | ICD-10-CM | POA: Diagnosis not present

## 2017-06-25 ENCOUNTER — Telehealth: Payer: Self-pay

## 2017-06-25 NOTE — Telephone Encounter (Signed)
As per Ranee Gosselin, [employer] [place], the patient had an appointment with social security on 06/20/17

## 2017-06-27 ENCOUNTER — Encounter (HOSPITAL_COMMUNITY): Payer: Medicare Other

## 2017-06-27 ENCOUNTER — Other Ambulatory Visit: Payer: Self-pay

## 2017-06-27 ENCOUNTER — Encounter: Payer: Medicare Other | Admitting: Vascular Surgery

## 2017-07-02 ENCOUNTER — Telehealth: Payer: Self-pay | Admitting: *Deleted

## 2017-07-02 NOTE — Telephone Encounter (Signed)
Spoke with patient.  He is agreeable to moving up Device Clinic appointment today to 11:45am/12:00pm.  [employer] rep will be present for assistance setting up a new Latitude monitor.

## 2017-07-07 ENCOUNTER — Encounter: Payer: Self-pay | Admitting: Family Medicine

## 2017-07-07 DIAGNOSIS — E559 Vitamin D deficiency, unspecified: Secondary | ICD-10-CM | POA: Insufficient documentation

## 2017-07-08 ENCOUNTER — Other Ambulatory Visit (HOSPITAL_COMMUNITY): Payer: Self-pay

## 2017-07-08 ENCOUNTER — Telehealth: Payer: Self-pay

## 2017-07-08 NOTE — Telephone Encounter (Signed)
Will route to PCP 

## 2017-07-08 NOTE — Progress Notes (Signed)
Paramedicine Encounter    Patient ID: Jacob [name], male    DOB: 09-20-1955, 62 y.o.   MRN: [id number]    Patient Care Team: Hoy Register, MD as PCP - General (Family Medicine) Clarisa Schools, RN as Registered Nurse Jena Gauss, Gerrit Friends, MD as Consulting Physician (Gastroenterology) Pleasant, Dennard Schaumann, RN as [facility] Care Management  Patient Active Problem List   Diagnosis Date Noted  . Vitamin D deficiency 07/07/2017  . Exertional angina (HCC)   . Chronic systolic heart failure (HCC) 12/17/2016  . Hyperglycemia 12/02/2016  . Osteoarthritis of right knee 12/02/2016  . Congestive heart disease (HCC) 01/01/2016  . Diabetic neuropathy (HCC) 11/28/2015  . Ascites 11/09/2015  . CHF (congestive heart failure) (HCC) 11/09/2015  . Insomnia 04/18/2015  . Claudication of right lower extremity (HCC) 04/07/2015  . Cardiomyopathy, ischemic 04/07/2015  . Chronic anticoagulation 03/30/2015  . Cardiorenal syndrome with renal failure   . SOB (shortness of breath)   . Morbid obesity due to excess calories (HCC)   . Helicobacter pylori ab+ 12/12/2014  . Calf pain   . Duodenal ulcer with hemorrhage   . Hemorrhagic shock (HCC)   . Cardiogenic shock (HCC)   . Diabetes mellitus (HCC)   . Coronary artery disease involving native coronary artery of native heart without angina pectoris   . Atrial fibrillation with rapid ventricular response (HCC) 10/29/2014  . Chest pain   . Atherosclerosis of native arteries of extremity with intermittent claudication (HCC) 09/06/2014  . Atrial fibrillation with controlled ventricular response (HCC) 08/03/2014  . Bacteremia   . DM (diabetes mellitus), type 2 with peripheral vascular complications (HCC) 08/01/2014  . Anemia of chronic disease 08/01/2014  . PVD (peripheral vascular disease) (HCC) 10/29/2013  . Tobacco abuse 05/13/2013  . AF (atrial fibrillation) (HCC) 02/11/2013  . ICD - in place- BS May 2014 [facility] 02/11/2013  . Microcytic anemia  01/11/2013  . CAD- s/p CABG July 2014 [facility] 12/04/2011  . Noncompliance 12/01/2011  . Essential hypertension 10/30/2006    Current Outpatient Medications:  .  ACCU-CHEK SOFTCLIX LANCETS lancets, Use for once daily testing of blood sugar (Patient not taking: Reported on 06/11/2017), Disp: 100 each, Rfl: 12 .  albuterol (PROVENTIL HFA;VENTOLIN HFA) 108 (90 Base) MCG/ACT inhaler, Inhale 2 puffs into the lungs every 6 (six) hours as needed for wheezing or shortness of breath., Disp: 1 Inhaler, Rfl: 2 .  albuterol (PROVENTIL) (2.5 MG/3ML) 0.083% nebulizer solution, Take 3 mLs (2.5 mg total) by nebulization every 6 (six) hours as needed for wheezing or shortness of breath. (Patient not taking: Reported on 06/11/2017), Disp: 150 mL, Rfl: 1 .  allopurinol (ZYLOPRIM) 100 MG tablet, Take 2 tablets (200 mg total) by mouth daily.(AM), Disp: 56 tablet, Rfl: 1 .  atorvastatin (LIPITOR) 40 MG tablet, take 1 TABLET BY MOUTH EVERY EVENING at 6pm, Disp: 28 tablet, Rfl: 2 .  Blood Glucose Monitoring Suppl (ACCU-CHEK AVIVA) device, Use as instructed1 times daily before meals (Patient not taking: Reported on 06/11/2017), Disp: 1 each, Rfl: 0 .  diclofenac sodium (VOLTAREN) 1 % GEL, Apply 4 g topically 4 (four) times daily. 100/16=7 (Patient not taking: Reported on 06/02/2017), Disp: 100 g, Rfl: 1 .  digoxin (LANOXIN) 0.125 MG tablet, Take 0.5 tablets (0.0625 mg total) by mouth daily., Disp: 14 tablet, Rfl: 3 .  ergocalciferol (DRISDOL) 50000 units capsule, Take 1 capsule (50,000 Units total) by mouth once a week., Disp: 4 capsule, Rfl: 1 .  gabapentin (NEURONTIN) 300 MG capsule,  Take 1 capsule (300 mg total) by mouth daily., Disp: 30 capsule, Rfl: 3 .  glipiZIDE (GLUCOTROL) 10 MG tablet, Take 1 tablet (10 mg total) by mouth 2 (two) times daily before a meal., Disp: 60 tablet, Rfl: 5 .  glucose blood (ACCU-CHEK AVIVA) test strip, Use as instructed for 1 times daily testing of blood sugar (Patient not taking:  Reported on 06/11/2017), Disp: 100 each, Rfl: 12 .  hydrALAZINE (APRESOLINE) 25 MG tablet, take 1 TABLET BY MOUTH THREE TIMES DAILY every morning, noon,evening (Patient taking differently: Take 25 mg by mouth 3 (three) times daily. take 1 TABLET BY MOUTH THREE TIMES DAILY every morning, noon,evening), Disp: 90 tablet, Rfl: 3 .  Insulin Glargine (LANTUS) 100 UNIT/ML Solostar Pen, Inject 38 Units into the skin daily at 10 pm., Disp: 4 pen, Rfl: 5 .  isosorbide mononitrate (IMDUR) 30 MG 24 hr tablet, Take 1 tablet (30 mg total) by mouth daily., Disp: 28 tablet, Rfl: 3 .  Lancet Devices (ACCU-CHEK SOFTCLIX) lancets, Use as instructed for once daily testing of blood sugar (Patient not taking: Reported on 06/02/2017), Disp: 1 each, Rfl: 0 .  metolazone (ZAROXOLYN) 2.5 MG tablet, Take 1 tablet once as directed by CHF clinic. (Patient not taking: Reported on 06/02/2017), Disp: 5 tablet, Rfl: 0 .  torsemide (DEMADEX) 20 MG tablet, Take 4 tablets (80 mg total) by mouth 2 (two) times daily., Disp: 240 tablet, Rfl: 3 .  traZODone (DESYREL) 100 MG tablet, Take 1 tablet (100 mg total) by mouth at bedtime as needed for sleep. (Patient not taking: Reported on 06/11/2017), Disp: 30 tablet, Rfl: 3 .  XARELTO 20 MG TABS tablet, take one 20mg  tablet daily by mouth (Patient taking differently: take one 20mg  tablet daily by mouth in the evening), Disp: 28 tablet, Rfl: 2 Allergies  Allergen Reactions  . Pork-Derived Products Other (See Comments)    Religious preference     Social History   Socioeconomic History  . Marital status: Divorced    Spouse name: Not on file  . Number of children: 3  . Years of education: Not on file  . Highest education level: Not on file  Occupational History  . Occupation: disabled  Social Needs  . Financial resource strain: Not on file  . Food insecurity:    Worry: Not on file    Inability: Not on file  . Transportation needs:    Medical: Not on file    Non-medical: Not on file   Tobacco Use  . Smoking status: Current Every Day Smoker    Packs/day: 0.50    Years: 40.00    Pack years: 20.00    Types: Cigarettes  . Smokeless tobacco: Never Used  Substance and Sexual Activity  . Alcohol use: No    Alcohol/week: 0.0 oz  . Drug use: No  . Sexual activity: Never  Lifestyle  . Physical activity:    Days per week: Not on file    Minutes per session: Not on file  . Stress: Not on file  Relationships  . Social connections:    Talks on phone: Not on file    Gets together: Not on file    Attends religious service: Not on file    Active member of club or organization: Not on file    Attends meetings of clubs or organizations: Not on file    Relationship status: Not on file  . Intimate partner violence:    Fear of current or ex partner: Not on file  Emotionally abused: Not on file    Physically abused: Not on file    Forced sexual activity: Not on file  Other Topics Concern  . Not on file  Social History Narrative   Has an apartment with a roommate. He was living on the streets in February 09, 2013.  He reports that his father died in [place] in February 09, 2013.  He is divorced.  He is no longer estranged from his son, but still from his daughter who lives locally.  Neither of his parents, nor any siblings have any history of CAD.    Physical Exam  Pulmonary/Chest: Effort normal. No respiratory distress.  Abdominal: Soft. There is no tenderness.  Musculoskeletal: He exhibits no edema.  Skin: Skin is warm and dry. He is not diaphoretic.        Future Appointments  Date Time Provider Department Center  07/30/2017  1:30 PM Hoy Register, MD [facility] None  08/13/2017  1:00 PM [facility] HS VASC 4 - SS [facility]  08/13/2017  2:00 PM Fransisco Hertz, MD [facility]    ATF pt CAO x4 sitting on the side of the bed c/o left knee pain.  Today's visit is to advise pt that [facility] is going out of business therefore I switched [facility] to [facility].  Pt is  currently using the bubble packs which [facility] will continue doing for him.  Pt stated that he has been having knee pain for several days but [facility] will not be able to schedule him an appointment until around May 11.  Pt denies sob, chest pain and dizziness.  Pt's blood sugar read HI intialy and 590 the second time.   Pt stated that he took 40 units of lantus about 1 hr prior to my visit.  I soke with Mardene Celeste from [facility], she stated that a nurse will call pt back or myself.  I will f/u with him if I don't hear back from anyone.  I advised pt to go to the urgent care closet to him if the pain doesn't decrease with tylenol.  He stated that he would.  BP 118/80 (BP Location: Right Arm, Patient Position: Sitting)   Pulse (!) 109   Resp 16   Wt 245 lb 12.8 oz (111.5 kg)   SpO2 98%   BMI 40.90 kg/m  CBG 590/HI  Weight yesterday-didn't weigh Last visit weight-254    Cloie Wooden, EMT Paramedic 07/08/2017    ACTION: Home visit completed

## 2017-07-09 ENCOUNTER — Telehealth (HOSPITAL_COMMUNITY): Payer: Self-pay

## 2017-07-09 NOTE — Telephone Encounter (Signed)
Could you please obtain more information regarding his blood sugars?  He should be taking his Lantus at nighttime which should cover for his insulin the following day and not taking it during the day and expecting results in 1 hour because it is not short-acting.

## 2017-07-09 NOTE — Telephone Encounter (Signed)
I called pt to see if anyone from [facility] followed up with him yesterday about him being hyperglycemic.  Pt stated that no one called him but his CBG was 326 this am and his about to take 40 units of lantus in a few mins.    I spoke with [facility] yesterday and  They will deliver his bubble pill packs for the month.  This will be the last refill from them due to them closing.

## 2017-07-10 ENCOUNTER — Other Ambulatory Visit (HOSPITAL_COMMUNITY): Payer: Self-pay

## 2017-07-10 ENCOUNTER — Telehealth (HOSPITAL_COMMUNITY): Payer: Self-pay | Admitting: *Deleted

## 2017-07-10 ENCOUNTER — Encounter (HOSPITAL_COMMUNITY): Payer: Self-pay

## 2017-07-10 MED ORDER — METOLAZONE 2.5 MG PO TABS: ORAL_TABLET | ORAL | 0 refills | Status: DC

## 2017-07-10 NOTE — Telephone Encounter (Signed)
Dee called to report patients weight gain of 5.5lbs overnight. Per Mardelle Matte take  Metolazone today and tomorrow. Geraldine Contras is aware and agreeable with plan.

## 2017-07-10 NOTE — Progress Notes (Signed)
Paramedicine Encounter    Patient ID: Jacob [name], male    DOB: 09-20-1955, 62 y.o.   MRN: [id number]    Patient Care Team: Hoy Register, MD as PCP - General (Family Medicine) Clarisa Schools, RN as Registered Nurse Jena Gauss, Gerrit Friends, MD as Consulting Physician (Gastroenterology) Pleasant, Dennard Schaumann, RN as [facility] Care Management  Patient Active Problem List   Diagnosis Date Noted  . Vitamin D deficiency 07/07/2017  . Exertional angina (HCC)   . Chronic systolic heart failure (HCC) 12/17/2016  . Hyperglycemia 12/02/2016  . Osteoarthritis of right knee 12/02/2016  . Congestive heart disease (HCC) 01/01/2016  . Diabetic neuropathy (HCC) 11/28/2015  . Ascites 11/09/2015  . CHF (congestive heart failure) (HCC) 11/09/2015  . Insomnia 04/18/2015  . Claudication of right lower extremity (HCC) 04/07/2015  . Cardiomyopathy, ischemic 04/07/2015  . Chronic anticoagulation 03/30/2015  . Cardiorenal syndrome with renal failure   . SOB (shortness of breath)   . Morbid obesity due to excess calories (HCC)   . Helicobacter pylori ab+ 12/12/2014  . Calf pain   . Duodenal ulcer with hemorrhage   . Hemorrhagic shock (HCC)   . Cardiogenic shock (HCC)   . Diabetes mellitus (HCC)   . Coronary artery disease involving native coronary artery of native heart without angina pectoris   . Atrial fibrillation with rapid ventricular response (HCC) 10/29/2014  . Chest pain   . Atherosclerosis of native arteries of extremity with intermittent claudication (HCC) 09/06/2014  . Atrial fibrillation with controlled ventricular response (HCC) 08/03/2014  . Bacteremia   . DM (diabetes mellitus), type 2 with peripheral vascular complications (HCC) 08/01/2014  . Anemia of chronic disease 08/01/2014  . PVD (peripheral vascular disease) (HCC) 10/29/2013  . Tobacco abuse 05/13/2013  . AF (atrial fibrillation) (HCC) 02/11/2013  . ICD - in place- BS May 2014 [facility] 02/11/2013  . Microcytic anemia  01/11/2013  . CAD- s/p CABG July 2014 [facility] 12/04/2011  . Noncompliance 12/01/2011  . Essential hypertension 10/30/2006    Current Outpatient Medications:  .  ACCU-CHEK SOFTCLIX LANCETS lancets, Use for once daily testing of blood sugar (Patient not taking: Reported on 06/11/2017), Disp: 100 each, Rfl: 12 .  albuterol (PROVENTIL HFA;VENTOLIN HFA) 108 (90 Base) MCG/ACT inhaler, Inhale 2 puffs into the lungs every 6 (six) hours as needed for wheezing or shortness of breath., Disp: 1 Inhaler, Rfl: 2 .  albuterol (PROVENTIL) (2.5 MG/3ML) 0.083% nebulizer solution, Take 3 mLs (2.5 mg total) by nebulization every 6 (six) hours as needed for wheezing or shortness of breath. (Patient not taking: Reported on 06/11/2017), Disp: 150 mL, Rfl: 1 .  allopurinol (ZYLOPRIM) 100 MG tablet, Take 2 tablets (200 mg total) by mouth daily.(AM), Disp: 56 tablet, Rfl: 1 .  atorvastatin (LIPITOR) 40 MG tablet, take 1 TABLET BY MOUTH EVERY EVENING at 6pm, Disp: 28 tablet, Rfl: 2 .  Blood Glucose Monitoring Suppl (ACCU-CHEK AVIVA) device, Use as instructed1 times daily before meals (Patient not taking: Reported on 06/11/2017), Disp: 1 each, Rfl: 0 .  diclofenac sodium (VOLTAREN) 1 % GEL, Apply 4 g topically 4 (four) times daily. 100/16=7 (Patient not taking: Reported on 06/02/2017), Disp: 100 g, Rfl: 1 .  digoxin (LANOXIN) 0.125 MG tablet, Take 0.5 tablets (0.0625 mg total) by mouth daily., Disp: 14 tablet, Rfl: 3 .  ergocalciferol (DRISDOL) 50000 units capsule, Take 1 capsule (50,000 Units total) by mouth once a week., Disp: 4 capsule, Rfl: 1 .  gabapentin (NEURONTIN) 300 MG capsule,  Take 1 capsule (300 mg total) by mouth daily., Disp: 30 capsule, Rfl: 3 .  glipiZIDE (GLUCOTROL) 10 MG tablet, Take 1 tablet (10 mg total) by mouth 2 (two) times daily before a meal., Disp: 60 tablet, Rfl: 5 .  glucose blood (ACCU-CHEK AVIVA) test strip, Use as instructed for 1 times daily testing of blood sugar (Patient not taking:  Reported on 06/11/2017), Disp: 100 each, Rfl: 12 .  hydrALAZINE (APRESOLINE) 25 MG tablet, take 1 TABLET BY MOUTH THREE TIMES DAILY every morning, noon,evening (Patient taking differently: Take 25 mg by mouth 3 (three) times daily. take 1 TABLET BY MOUTH THREE TIMES DAILY every morning, noon,evening), Disp: 90 tablet, Rfl: 3 .  Insulin Glargine (LANTUS) 100 UNIT/ML Solostar Pen, Inject 38 Units into the skin daily at 10 pm., Disp: 4 pen, Rfl: 5 .  isosorbide mononitrate (IMDUR) 30 MG 24 hr tablet, Take 1 tablet (30 mg total) by mouth daily., Disp: 28 tablet, Rfl: 3 .  Lancet Devices (ACCU-CHEK SOFTCLIX) lancets, Use as instructed for once daily testing of blood sugar (Patient not taking: Reported on 06/02/2017), Disp: 1 each, Rfl: 0 .  metolazone (ZAROXOLYN) 2.5 MG tablet, Take 1 tablet once as directed by CHF clinic., Disp: 5 tablet, Rfl: 0 .  torsemide (DEMADEX) 20 MG tablet, Take 4 tablets (80 mg total) by mouth 2 (two) times daily., Disp: 240 tablet, Rfl: 3 .  traZODone (DESYREL) 100 MG tablet, Take 1 tablet (100 mg total) by mouth at bedtime as needed for sleep. (Patient not taking: Reported on 06/11/2017), Disp: 30 tablet, Rfl: 3 .  XARELTO 20 MG TABS tablet, take one 20mg  tablet daily by mouth (Patient taking differently: take one 20mg  tablet daily by mouth in the evening), Disp: 28 tablet, Rfl: 2 Allergies  Allergen Reactions  . Pork-Derived Products Other (See Comments)    Religious preference     Social History   Socioeconomic History  . Marital status: Divorced    Spouse name: Not on file  . Number of children: 3  . Years of education: Not on file  . Highest education level: Not on file  Occupational History  . Occupation: disabled  Social Needs  . Financial resource strain: Not on file  . Food insecurity:    Worry: Not on file    Inability: Not on file  . Transportation needs:    Medical: Not on file    Non-medical: Not on file  Tobacco Use  . Smoking status: Current Every  Day Smoker    Packs/day: 0.50    Years: 40.00    Pack years: 20.00    Types: Cigarettes  . Smokeless tobacco: Never Used  Substance and Sexual Activity  . Alcohol use: No    Alcohol/week: 0.0 oz  . Drug use: No  . Sexual activity: Never  Lifestyle  . Physical activity:    Days per week: Not on file    Minutes per session: Not on file  . Stress: Not on file  Relationships  . Social connections:    Talks on phone: Not on file    Gets together: Not on file    Attends religious service: Not on file    Active member of club or organization: Not on file    Attends meetings of clubs or organizations: Not on file    Relationship status: Not on file  . Intimate partner violence:    Fear of current or ex partner: Not on file    Emotionally abused: Not  on file    Physically abused: Not on file    Forced sexual activity: Not on file  Other Topics Concern  . Not on file  Social History Narrative   Has an apartment with a roommate. He was living on the streets in 01-22-2013.  He reports that his father died in [place] in January 22, 2013.  He is divorced.  He is no longer estranged from his son, but still from his daughter who lives locally.  Neither of his parents, nor any siblings have any history of CAD.    Physical Exam  Pulmonary/Chest: No respiratory distress.  Musculoskeletal: He exhibits no edema.  Skin: Skin is warm and dry. He is not diaphoretic.        Future Appointments  Date Time Provider Department Center  07/30/2017  1:30 PM Hoy Register, MD [facility] None  08/13/2017  1:00 PM [facility] - [facility]  08/13/2017  2:00 PM Fransisco Hertz, MD [facility]    ATF pt CAO x4 sitting on the side of his bed, just finished smoking a cigarette.  Today's visit is to deliver and check his bubble pill packs.  Pt stated that he had not taken any meds this am because he was out.  Pt's abdomen appears to be more swollen today that Tuesday; pts weight has increased by 5lbs  within 2 days.  Jacob [name] at the heart failure clinic spoke with Jacob [name], who advised pt to take a metolazone today and tomorrow along with his regular medications. I will f/u with pt to see if had lost any weight next Tuesday.  Pt denies sob, chest pain and dizziness.    BP 138/68 (BP Location: Right Arm, Patient Position: Sitting, Cuff Size: Normal)   Pulse 100   Resp 16   Wt 250 lb (113.4 kg)   SpO2 98%   BMI 41.60 kg/m  cbg 490 (pt stated that he was going to take 38 units of insulin)  Weight yesterday-didn't weigh Last visit weight-245    Jacob [name], EMT Paramedic 07/10/2017    ACTION: Home visit completed

## 2017-07-15 ENCOUNTER — Telehealth: Payer: Self-pay

## 2017-07-15 NOTE — Telephone Encounter (Signed)
Call placed to the patient to check on his blood sugars and to remind him to keep a log of the results. He stated that his fasting blood sugar this morning was 165 and yesterday morning it was 140. Last night at bedtime it was 175.  He confirmed that he continues to take lantus 38 units at bedtime.

## 2017-07-15 NOTE — Telephone Encounter (Signed)
Noted  

## 2017-07-22 ENCOUNTER — Encounter: Payer: Self-pay | Admitting: Cardiovascular Disease

## 2017-07-23 DIAGNOSIS — H5203 Hypermetropia, bilateral: Secondary | ICD-10-CM | POA: Diagnosis not present

## 2017-07-23 DIAGNOSIS — H524 Presbyopia: Secondary | ICD-10-CM | POA: Diagnosis not present

## 2017-07-23 DIAGNOSIS — H401132 Primary open-angle glaucoma, bilateral, moderate stage: Secondary | ICD-10-CM | POA: Diagnosis not present

## 2017-07-23 DIAGNOSIS — E119 Type 2 diabetes mellitus without complications: Secondary | ICD-10-CM | POA: Diagnosis not present

## 2017-07-23 DIAGNOSIS — H2513 Age-related nuclear cataract, bilateral: Secondary | ICD-10-CM | POA: Diagnosis not present

## 2017-07-23 DIAGNOSIS — H52203 Unspecified astigmatism, bilateral: Secondary | ICD-10-CM | POA: Diagnosis not present

## 2017-07-23 DIAGNOSIS — H25013 Cortical age-related cataract, bilateral: Secondary | ICD-10-CM | POA: Diagnosis not present

## 2017-07-23 DIAGNOSIS — Z794 Long term (current) use of insulin: Secondary | ICD-10-CM | POA: Diagnosis not present

## 2017-07-29 ENCOUNTER — Encounter

## 2017-07-30 ENCOUNTER — Encounter: Payer: Self-pay | Admitting: Family Medicine

## 2017-07-30 ENCOUNTER — Ambulatory Visit: Payer: Medicare Other | Attending: Family Medicine | Admitting: Family Medicine

## 2017-07-30 VITALS — BP 123/69 | HR 114 | Temp 97.9°F | Ht 65.0 in | Wt 251.4 lb

## 2017-07-30 DIAGNOSIS — Z955 Presence of coronary angioplasty implant and graft: Secondary | ICD-10-CM | POA: Insufficient documentation

## 2017-07-30 DIAGNOSIS — E1151 Type 2 diabetes mellitus with diabetic peripheral angiopathy without gangrene: Secondary | ICD-10-CM | POA: Diagnosis not present

## 2017-07-30 DIAGNOSIS — E1149 Type 2 diabetes mellitus with other diabetic neurological complication: Secondary | ICD-10-CM | POA: Insufficient documentation

## 2017-07-30 DIAGNOSIS — Z794 Long term (current) use of insulin: Secondary | ICD-10-CM | POA: Diagnosis not present

## 2017-07-30 DIAGNOSIS — I252 Old myocardial infarction: Secondary | ICD-10-CM | POA: Insufficient documentation

## 2017-07-30 DIAGNOSIS — Z79899 Other long term (current) drug therapy: Secondary | ICD-10-CM | POA: Diagnosis not present

## 2017-07-30 DIAGNOSIS — Z95810 Presence of automatic (implantable) cardiac defibrillator: Secondary | ICD-10-CM | POA: Insufficient documentation

## 2017-07-30 DIAGNOSIS — I255 Ischemic cardiomyopathy: Secondary | ICD-10-CM | POA: Insufficient documentation

## 2017-07-30 DIAGNOSIS — Z7901 Long term (current) use of anticoagulants: Secondary | ICD-10-CM | POA: Diagnosis not present

## 2017-07-30 DIAGNOSIS — I482 Chronic atrial fibrillation, unspecified: Secondary | ICD-10-CM

## 2017-07-30 DIAGNOSIS — I25811 Atherosclerosis of native coronary artery of transplanted heart without angina pectoris: Secondary | ICD-10-CM | POA: Diagnosis not present

## 2017-07-30 DIAGNOSIS — E7800 Pure hypercholesterolemia, unspecified: Secondary | ICD-10-CM | POA: Insufficient documentation

## 2017-07-30 DIAGNOSIS — R42 Dizziness and giddiness: Secondary | ICD-10-CM

## 2017-07-30 DIAGNOSIS — I10 Essential (primary) hypertension: Secondary | ICD-10-CM | POA: Diagnosis not present

## 2017-07-30 DIAGNOSIS — I5042 Chronic combined systolic (congestive) and diastolic (congestive) heart failure: Secondary | ICD-10-CM | POA: Insufficient documentation

## 2017-07-30 DIAGNOSIS — Z951 Presence of aortocoronary bypass graft: Secondary | ICD-10-CM | POA: Diagnosis not present

## 2017-07-30 DIAGNOSIS — I110 Hypertensive heart disease with heart failure: Secondary | ICD-10-CM | POA: Diagnosis not present

## 2017-07-30 DIAGNOSIS — I2510 Atherosclerotic heart disease of native coronary artery without angina pectoris: Secondary | ICD-10-CM | POA: Diagnosis not present

## 2017-07-30 DIAGNOSIS — M1711 Unilateral primary osteoarthritis, right knee: Secondary | ICD-10-CM | POA: Diagnosis not present

## 2017-07-30 DIAGNOSIS — E1159 Type 2 diabetes mellitus with other circulatory complications: Secondary | ICD-10-CM | POA: Diagnosis not present

## 2017-07-30 LAB — GLUCOSE, POCT (MANUAL RESULT ENTRY): POC Glucose: 217 mg/dl — AB (ref 70–99)

## 2017-07-30 LAB — POCT GLYCOSYLATED HEMOGLOBIN (HGB A1C): Hemoglobin A1C: 10.8

## 2017-07-30 MED ORDER — METOLAZONE 2.5 MG PO TABS
ORAL_TABLET | ORAL | 0 refills | Status: DC
Start: 2017-07-30 — End: 2017-09-30

## 2017-07-30 MED ORDER — ALLOPURINOL 100 MG PO TABS: ORAL_TABLET | ORAL | 3 refills | Status: DC

## 2017-07-30 MED ORDER — INSULIN GLARGINE 100 UNIT/ML SOLOSTAR PEN: 50.0000 [IU] | PEN_INJECTOR | Freq: Every day | SUBCUTANEOUS | 5 refills | Status: DC

## 2017-07-30 MED ORDER — RIVAROXABAN 20 MG PO TABS: ORAL_TABLET | ORAL | 3 refills | Status: DC

## 2017-07-30 MED ORDER — ISOSORBIDE MONONITRATE ER 30 MG PO TB24: 30.0000 mg | ORAL_TABLET | Freq: Every day | ORAL | 3 refills | Status: DC

## 2017-07-30 MED ORDER — DICLOFENAC SODIUM 1 % TD GEL: TRANSDERMAL | 1 refills | Status: DC

## 2017-07-30 MED ORDER — ACETAMINOPHEN-CODEINE #3 300-30 MG PO TABS: 1.0000 | ORAL_TABLET | Freq: Every evening | ORAL | 0 refills | Status: DC | PRN

## 2017-07-30 MED ORDER — ATORVASTATIN CALCIUM 40 MG PO TABS: ORAL_TABLET | ORAL | 6 refills | Status: DC

## 2017-07-30 MED ORDER — HYDRALAZINE HCL 25 MG PO TABS: ORAL_TABLET | ORAL | 3 refills | Status: DC

## 2017-07-30 MED ORDER — TORSEMIDE 20 MG PO TABS: 80.0000 mg | ORAL_TABLET | Freq: Two times a day (BID) | ORAL | 3 refills | Status: DC

## 2017-07-30 MED ORDER — DIGOXIN 125 MCG PO TABS: 0.0625 mg | ORAL_TABLET | Freq: Every day | ORAL | 3 refills | Status: DC

## 2017-07-30 MED ORDER — GLIPIZIDE 10 MG PO TABS: 10.0000 mg | ORAL_TABLET | Freq: Two times a day (BID) | ORAL | 5 refills | Status: DC

## 2017-07-30 MED ORDER — GABAPENTIN 300 MG PO CAPS: 300.0000 mg | ORAL_CAPSULE | Freq: Every day | ORAL | 3 refills | Status: DC

## 2017-07-30 MED ORDER — MECLIZINE HCL 25 MG PO TABS: 25.0000 mg | ORAL_TABLET | Freq: Three times a day (TID) | ORAL | 1 refills | Status: DC | PRN

## 2017-07-30 NOTE — Progress Notes (Signed)
Subjective:  Patient ID: Jacob [name], male    DOB: 07/08/1955  Age: 62 y.o. MRN: [id number]  CC: Diabetes and Dizziness   HPI Jacob [name] is a 62 year old male with a history of type 2 diabetes mellitus (A1c  10.8), chronic combined systolic and diastolic heart failure status post ICD (EF 30-35% from 11/2016, moderately to severely reduced systolic function), chronic atrial fibrillation (on anticoagulation with Xarelto, amiodarone discontinued as fibrotic interstitial lung disease could not be excluded on high-resolution CT chest), CAD s/p CABG with MAZE peripheral vascular disease here for a follow-up visit. He had an ED visit in 05/2017 for acute on chronic CHF where he had presented with shortness of breath and was treated with IV Lasix, ruled out for ACS and subsequently discharged. Cardiac cath from 04/23/2017 revealed: Assessment: 1. 3v CAD with stable revascularization with patent SVG to LPDA and SVG to Ramus 2. LAD with non-obstructive disease 3. Filling pressures normal 4. Moderately depressed cardiac output  Plan/Discussion: Stable CAD with relatively well compensated hemodynamics.  Today he informs me he gets short of breath at night but endorses taking his torsemide and has been out of his metolazone.  He has not been to see cardiology of recent and review of his weight indicates a 6 pound weight gain in the last 3 weeks.  He complains of right knee pain and has nothing for pain; previously seen by orthopedics and is status post cortisone injection. His A1c is 10.8 and he endorses compliance with his medications and denies hypoglycemia.  He does have visual abnormalities and is currently being followed by ophthalmology at [facility] eye care.  Past Medical History:  Diagnosis Date  . Atrial fibrillation (HCC)    RVR 10/2014  . Automatic implantable cardioverter-defibrillator in situ   . CAD (coronary artery disease) Sept 2013   s/p cardiac cath showing occlusion of small RCA with  collaterals  . CHF (congestive heart failure) (HCC)    20 to 25 % EF and RV dysfunction by 07/2014 echo   . Chronic anticoagulation    on xarelto.   . High cholesterol   . Hypertension   . Myocardial infarction (HCC) 2014  . Noncompliance    homelessness contributing.   . Peripheral arterial disease (HCC)   . Type II diabetes mellitus (HCC)     Past Surgical History:  Procedure Laterality Date  . CARDIAC CATHETERIZATION  09/2012  . CORONARY ANGIOPLASTY WITH STENT PLACEMENT  11/2011   "1"  . CORONARY ARTERY BYPASS GRAFT  09/2012   2 vessels per patient [name]   . ESOPHAGOGASTRODUODENOSCOPY N/A 11/28/2014   Procedure: ESOPHAGOGASTRODUODENOSCOPY (EGD);  Surgeon: Beverley Fiedler, MD;  Location: [facility] ENDOSCOPY;  Service: Endoscopy;  Laterality: N/A;  . ILIAC ARTERY STENT Right 08/30/2013  . IMPLANTABLE CARDIOVERTER DEFIBRILLATOR IMPLANT     [place] in 07/2012; [employer]  . LEFT AND RIGHT HEART CATHETERIZATION WITH CORONARY ANGIOGRAM N/A 12/02/2011   Procedure: LEFT AND RIGHT HEART CATHETERIZATION WITH CORONARY ANGIOGRAM;  Surgeon: Kathleene Hazel, MD;  Location: [facility] CATH LAB;  Service: Cardiovascular;  Laterality: N/A;  . LOWER EXTREMITY ANGIOGRAM N/A 08/30/2013   Procedure: LOWER EXTREMITY ANGIOGRAM;  Surgeon: Runell Gess, MD;  Location[facility] CATH LAB;  Service: Cardiovascular;  Laterality: N/A;  . LOWER EXTREMITY ANGIOGRAM N/A 12/02/2013   Procedure: LOWER EXTREMITY ANGIOGRAM;  Surgeon: Runell Gess, MD;  Location: [facility] CATH LAB;  Service: Cardiovascular;  Laterality: N/A;  . RIGHT/LEFT HEART CATH AND CORONARY/GRAFT ANGIOGRAPHY N/A 04/23/2017  Procedure: RIGHT/LEFT HEART CATH AND CORONARY/GRAFT ANGIOGRAPHY;  Surgeon: Dolores Patty, MD;  Location: [facility] INVASIVE CV LAB;  Service: Cardiovascular;  Laterality: N/A;    Allergies  Allergen Reactions  . Pork-Derived Products Other (See Comments)    Religious preference     Outpatient Medications Prior to Visit  Medication Sig  Dispense Refill  . albuterol (PROVENTIL HFA;VENTOLIN HFA) 108 (90 Base) MCG/ACT inhaler Inhale 2 puffs into the lungs every 6 (six) hours as needed for wheezing or shortness of breath. 1 Inhaler 2  . ergocalciferol (DRISDOL) 50000 units capsule Take 1 capsule (50,000 Units total) by mouth once a week. 4 capsule 1  . allopurinol (ZYLOPRIM) 100 MG tablet Take 2 tablets (200 mg total) by mouth daily.(AM) 56 tablet 1  . atorvastatin (LIPITOR) 40 MG tablet take 1 TABLET BY MOUTH EVERY EVENING at 6pm 28 tablet 2  . digoxin (LANOXIN) 0.125 MG tablet Take 0.5 tablets (0.0625 mg total) by mouth daily. 14 tablet 3  . gabapentin (NEURONTIN) 300 MG capsule Take 1 capsule (300 mg total) by mouth daily. 30 capsule 3  . glipiZIDE (GLUCOTROL) 10 MG tablet Take 1 tablet (10 mg total) by mouth 2 (two) times daily before a meal. 60 tablet 5  . hydrALAZINE (APRESOLINE) 25 MG tablet take 1 TABLET BY MOUTH THREE TIMES DAILY every morning, noon,evening (Patient taking differently: Take 25 mg by mouth 3 (three) times daily. take 1 TABLET BY MOUTH THREE TIMES DAILY every morning, noon,evening) 90 tablet 3  . Insulin Glargine (LANTUS) 100 UNIT/ML Solostar Pen Inject 38 Units into the skin daily at 10 pm. 4 pen 5  . isosorbide mononitrate (IMDUR) 30 MG 24 hr tablet Take 1 tablet (30 mg total) by mouth daily. 28 tablet 3  . metolazone (ZAROXOLYN) 2.5 MG tablet Take 1 tablet once as directed by CHF clinic. 5 tablet 0  . torsemide (DEMADEX) 20 MG tablet Take 4 tablets (80 mg total) by mouth 2 (two) times daily. 240 tablet 3  . XARELTO 20 MG TABS tablet take one 20mg  tablet daily by mouth (Patient taking differently: take one 20mg  tablet daily by mouth in the evening) 28 tablet 2  . ACCU-CHEK SOFTCLIX LANCETS lancets Use for once daily testing of blood sugar (Patient not taking: Reported on 06/11/2017) 100 each 12  . albuterol (PROVENTIL) (2.5 MG/3ML) 0.083% nebulizer solution Take 3 mLs (2.5 mg total) by nebulization every 6 (six)  hours as needed for wheezing or shortness of breath. (Patient not taking: Reported on 06/11/2017) 150 mL 1  . Blood Glucose Monitoring Suppl (ACCU-CHEK AVIVA) device Use as instructed1 times daily before meals (Patient not taking: Reported on 06/11/2017) 1 each 0  . glucose blood (ACCU-CHEK AVIVA) test strip Use as instructed for 1 times daily testing of blood sugar (Patient not taking: Reported on 06/11/2017) 100 each 12  . Lancet Devices (ACCU-CHEK SOFTCLIX) lancets Use as instructed for once daily testing of blood sugar (Patient not taking: Reported on 06/02/2017) 1 each 0  . traZODone (DESYREL) 100 MG tablet Take 1 tablet (100 mg total) by mouth at bedtime as needed for sleep. (Patient not taking: Reported on 06/11/2017) 30 tablet 3  . diclofenac sodium (VOLTAREN) 1 % GEL Apply 4 g topically 4 (four) times daily. 100/16=7 (Patient not taking: Reported on 06/02/2017) 100 g 1   No facility-administered medications prior to visit.     ROS Review of Systems  Constitutional: Negative for activity change and appetite change.  HENT: Negative for sinus pressure  and sore throat.   Eyes: Positive for visual disturbance.  Respiratory: Positive for shortness of breath. Negative for cough and chest tightness.   Cardiovascular: Negative for chest pain and leg swelling.  Gastrointestinal: Negative for abdominal distention, abdominal pain, constipation and diarrhea.  Endocrine: Negative.   Genitourinary: Negative for dysuria.  Musculoskeletal:       See hpi  Skin: Negative for rash.  Allergic/Immunologic: Negative.   Neurological: Negative for weakness, light-headedness and numbness.  Psychiatric/Behavioral: Negative for dysphoric mood and suicidal ideas.    Objective:  BP 123/69   Pulse (!) 114   Temp 97.9 F (36.6 C) (Oral)   Ht 5\' 5"  (1.651 m)   Wt 251 lb 6.4 oz (114 kg)   SpO2 97%   BMI 41.84 kg/m   BP/Weight 07/30/2017 07/10/2017 07/08/2017  Systolic BP 123 138 118  Diastolic BP 69 68 80    Wt. (Lbs) 251.4 250 245.8  BMI 41.84 41.6 40.9    Wt Readings from Last 3 Encounters:  07/30/17 251 lb 6.4 oz (114 kg)  07/10/17 250 lb (113.4 kg)  07/08/17 245 lb 12.8 oz (111.5 kg)     Physical Exam  Constitutional: He is oriented to person, place, and time. He appears well-developed and well-nourished.  Neck: No JVD present.  Cardiovascular: Normal rate and normal heart sounds. An irregularly irregular rhythm present.  No murmur heard. Unable to palpate dorsalis pedis bilaterally  Pulmonary/Chest: Effort normal and breath sounds normal. He has no wheezes. He has no rales. He exhibits no tenderness.  Abdominal: Soft. Bowel sounds are normal. He exhibits no distension and no mass. There is no tenderness.  Musculoskeletal: He exhibits tenderness (TTP on ROM of R knee, on medial and lateral joint lines, crepitus on exam).  Neurological: He is alert and oriented to person, place, and time.  Skin: Skin is warm and dry.  Psychiatric: He has a normal mood and affect.     Lab Results  Component Value Date   HGBA1C 10.8 07/30/2017    Assessment & Plan:   1. DM (diabetes mellitus), type 2 with peripheral vascular complications (HCC) Continues to experience claudication pain No surgical intervention as per vascular  2. Other diabetic neurological complication associated with type 2 diabetes mellitus (HCC) Uncontrolled with A1c of 10.8 Increase Lantus dose Compliance with diabetic diet and lifestyle modifications emphasized - Insulin Glargine (LANTUS) 100 UNIT/ML Solostar Pen; Inject 50 Units into the skin daily at 10 pm.  Dispense: 5 pen; Refill: 5  3. Type 2 diabetes mellitus with other circulatory complication, with long-term current use of insulin (HCC) - POCT glucose (manual entry) - POCT glycosylated hemoglobin (Hb A1C) - Insulin Glargine (LANTUS) 100 UNIT/ML Solostar Pen; Inject 50 Units into the skin daily at 10 pm.  Dispense: 5 pen; Refill: 5 - glipiZIDE (GLUCOTROL) 10  MG tablet; Take 1 tablet (10 mg total) by mouth 2 (two) times daily before a meal.  Dispense: 60 tablet; Refill: 5 - gabapentin (NEURONTIN) 300 MG capsule; Take 1 capsule (300 mg total) by mouth daily.  Dispense: 30 capsule; Refill: 3  4. Coronary artery disease involving native artery of transplanted heart without angina pectoris Stable Low-cholesterol diet - atorvastatin (LIPITOR) 40 MG tablet; take 1 TABLET BY MOUTH EVERY EVENING at 6pm  Dispense: 30 tablet; Refill: 6  5. Cardiomyopathy, ischemic EF 30 to 35% from 11/2016 Weight is up 6 pounds in the last 3 weeks and he is symptomatic I have refilled metolazone and he is to  call the cardiology clinic to schedule an appointment - metolazone (ZAROXOLYN) 2.5 MG tablet; Take 1 tablet once as directed by CHF clinic.  Dispense: 5 tablet; Refill: 0 - digoxin (LANOXIN) 0.125 MG tablet; Take 0.5 tablets (0.0625 mg total) by mouth daily.  Dispense: 14 tablet; Refill: 3 - hydrALAZINE (APRESOLINE) 25 MG tablet; take 1 TABLET BY MOUTH THREE TIMES DAILY every morning, noon,evening  Dispense: 90 tablet; Refill: 3 - isosorbide mononitrate (IMDUR) 30 MG 24 hr tablet; Take 1 tablet (30 mg total) by mouth daily.  Dispense: 30 tablet; Refill: 3 - torsemide (DEMADEX) 20 MG tablet; Take 4 tablets (80 mg total) by mouth 2 (two) times daily.  Dispense: 240 tablet; Refill: 3  6. Chronic atrial fibrillation (HCC) Currently in atrial fibrillation Amiodarone previously discontinued due to chest CT findings concerning for interstitial lung disease He may need addition of metoprolol however his blood pressures in the soft side today He has not followed up with cardiology recently and has been advised to schedule an appointment  - rivaroxaban (XARELTO) 20 MG TABS tablet; take one 20mg  tablet daily by mouth  Dispense: 30 tablet; Refill: 3  7. Primary osteoarthritis of right knee Uncontrolled Status post cortisone injection in the past We will refill Tylenol 3 and  refer to orthopedics if persisting. - acetaminophen-codeine (TYLENOL #3) 300-30 MG tablet; Take 1 tablet by mouth at bedtime as needed for moderate pain.  Dispense: 30 tablet; Refill: 0 - diclofenac sodium (VOLTAREN) 1 % GEL; Apply 4 g topically 4 (four) times daily. 100/16=7  Dispense: 100 g; Refill: 1  8. Essential hypertension Low-sodium diet   Meds ordered this encounter  Medications  . acetaminophen-codeine (TYLENOL #3) 300-30 MG tablet    Sig: Take 1 tablet by mouth at bedtime as needed for moderate pain.    Dispense:  30 tablet    Refill:  0  . Insulin Glargine (LANTUS) 100 UNIT/ML Solostar Pen    Sig: Inject 50 Units into the skin daily at 10 pm.    Dispense:  5 pen    Refill:  5    Discontinue previous dose  . diclofenac sodium (VOLTAREN) 1 % GEL    Sig: Apply 4 g topically 4 (four) times daily. 100/16=7    Dispense:  100 g    Refill:  1    This prescription was filled on 02/18/2017. Any refills authorized will be placed on file.  . metolazone (ZAROXOLYN) 2.5 MG tablet    Sig: Take 1 tablet once as directed by CHF clinic.    Dispense:  5 tablet    Refill:  0  . glipiZIDE (GLUCOTROL) 10 MG tablet    Sig: Take 1 tablet (10 mg total) by mouth 2 (two) times daily before a meal.    Dispense:  60 tablet    Refill:  5    Discontinue previous dose  . gabapentin (NEURONTIN) 300 MG capsule    Sig: Take 1 capsule (300 mg total) by mouth daily.    Dispense:  30 capsule    Refill:  3  . atorvastatin (LIPITOR) 40 MG tablet    Sig: take 1 TABLET BY MOUTH EVERY EVENING at 6pm    Dispense:  30 tablet    Refill:  6  . allopurinol (ZYLOPRIM) 100 MG tablet    Sig: Take 2 tablets (200 mg total) by mouth daily.(AM)    Dispense:  60 tablet    Refill:  3    This prescription was filled on 06/20/2017.  Any refills authorized will be placed on file.  . digoxin (LANOXIN) 0.125 MG tablet    Sig: Take 0.5 tablets (0.0625 mg total) by mouth daily.    Dispense:  14 tablet    Refill:  3     This prescription was filled on 04/03/2017. Any refills authorized will be placed on file.  . hydrALAZINE (APRESOLINE) 25 MG tablet    Sig: take 1 TABLET BY MOUTH THREE TIMES DAILY every morning, noon,evening    Dispense:  90 tablet    Refill:  3    This prescription was filled on 11/11/2016. Any refills authorized will be placed on file.  . isosorbide mononitrate (IMDUR) 30 MG 24 hr tablet    Sig: Take 1 tablet (30 mg total) by mouth daily.    Dispense:  30 tablet    Refill:  3    This prescription was filled on 04/03/2017. Any refills authorized will be placed on file.  . torsemide (DEMADEX) 20 MG tablet    Sig: Take 4 tablets (80 mg total) by mouth 2 (two) times daily.    Dispense:  240 tablet    Refill:  3    This prescription was filled on 03/24/2017. Any refills authorized will be placed on file.  . rivaroxaban (XARELTO) 20 MG TABS tablet    Sig: take one 20mg  tablet daily by mouth    Dispense:  30 tablet    Refill:  3    This prescription was filled on 05/19/2017. Any refills authorized will be placed on file.    Follow-up: Return in about 3 months (around 10/30/2017) for Follow up of chronic medical conditions.   Hoy Register MD

## 2017-07-30 NOTE — Patient Instructions (Signed)
Diabetes Mellitus and Nutrition When you have diabetes (diabetes mellitus), it is very important to have healthy eating habits because your blood sugar (glucose) levels are greatly affected by what you eat and drink. Eating healthy foods in the appropriate amounts, at about the same times every day, can help you:  Control your blood glucose.  Lower your risk of heart disease.  Improve your blood pressure.  Reach or maintain a healthy weight.  Every person with diabetes is different, and each person has different needs for a meal plan. Your health care provider may recommend that you work with a diet and nutrition specialist (dietitian) to make a meal plan that is best for you. Your meal plan may vary depending on factors such as:  The calories you need.  The medicines you take.  Your weight.  Your blood glucose, blood pressure, and cholesterol levels.  Your activity level.  Other health conditions you have, such as heart or kidney disease.  How do carbohydrates affect me? Carbohydrates affect your blood glucose level more than any other type of food. Eating carbohydrates naturally increases the amount of glucose in your blood. Carbohydrate counting is a method for keeping track of how many carbohydrates you eat. Counting carbohydrates is important to keep your blood glucose at a healthy level, especially if you use insulin or take certain oral diabetes medicines. It is important to know how many carbohydrates you can safely have in each meal. This is different for every person. Your dietitian can help you calculate how many carbohydrates you should have at each meal and for snack. Foods that contain carbohydrates include:  Bread, cereal, rice, pasta, and crackers.  Potatoes and corn.  Peas, beans, and lentils.  Milk and yogurt.  Fruit and juice.  Desserts, such as cakes, cookies, ice cream, and candy.  How does alcohol affect me? Alcohol can cause a sudden decrease in blood  glucose (hypoglycemia), especially if you use insulin or take certain oral diabetes medicines. Hypoglycemia can be a life-threatening condition. Symptoms of hypoglycemia (sleepiness, dizziness, and confusion) are similar to symptoms of having too much alcohol. If your health care provider says that alcohol is safe for you, follow these guidelines:  Limit alcohol intake to no more than 1 drink per day for nonpregnant women and 2 drinks per day for men. One drink equals 12 oz of beer, 5 oz of wine, or 1 oz of hard liquor.  Do not drink on an empty stomach.  Keep yourself hydrated with water, diet soda, or unsweetened iced tea.  Keep in mind that regular soda, juice, and other mixers may contain a lot of sugar and must be counted as carbohydrates.  What are tips for following this plan? Reading food labels  Start by checking the serving size on the label. The amount of calories, carbohydrates, fats, and other nutrients listed on the label are based on one serving of the food. Many foods contain more than one serving per package.  Check the total grams (g) of carbohydrates in one serving. You can calculate the number of servings of carbohydrates in one serving by dividing the total carbohydrates by 15. For example, if a food has 30 g of total carbohydrates, it would be equal to 2 servings of carbohydrates.  Check the number of grams (g) of saturated and trans fats in one serving. Choose foods that have low or no amount of these fats.  Check the number of milligrams (mg) of sodium in one serving. Most people   should limit total sodium intake to less than 2,300 mg per day.  Always check the nutrition information of foods labeled as "low-fat" or "nonfat". These foods may be higher in added sugar or refined carbohydrates and should be avoided.  Talk to your dietitian to identify your daily goals for nutrients listed on the label. Shopping  Avoid buying canned, premade, or processed foods. These  foods tend to be high in fat, sodium, and added sugar.  Shop around the outside edge of the grocery store. This includes fresh fruits and vegetables, bulk grains, fresh meats, and fresh dairy. Cooking  Use low-heat cooking methods, such as baking, instead of high-heat cooking methods like deep frying.  Cook using healthy oils, such as olive, canola, or sunflower oil.  Avoid cooking with butter, cream, or high-fat meats. Meal planning  Eat meals and snacks regularly, preferably at the same times every day. Avoid going long periods of time without eating.  Eat foods high in fiber, such as fresh fruits, vegetables, beans, and whole grains. Talk to your dietitian about how many servings of carbohydrates you can eat at each meal.  Eat 4-6 ounces of lean protein each day, such as lean meat, chicken, fish, eggs, or tofu. 1 ounce is equal to 1 ounce of meat, chicken, or fish, 1 egg, or 1/4 cup of tofu.  Eat some foods each day that contain healthy fats, such as avocado, nuts, seeds, and fish. Lifestyle   Check your blood glucose regularly.  Exercise at least 30 minutes 5 or more days each week, or as told by your health care provider.  Take medicines as told by your health care provider.  Do not use any products that contain nicotine or tobacco, such as cigarettes and e-cigarettes. If you need help quitting, ask your health care provider.  Work with a counselor or diabetes educator to identify strategies to manage stress and any emotional and social challenges. What are some questions to ask my health care provider?  Do I need to meet with a diabetes educator?  Do I need to meet with a dietitian?  What number can I call if I have questions?  When are the best times to check my blood glucose? Where to find more information:  American Diabetes Association: diabetes.org/food-and-fitness/food  Academy of Nutrition and Dietetics:  www.eatright.org/resources/health/diseases-and-conditions/diabetes  National Institute of Diabetes and Digestive and Kidney Diseases (NIH): www.niddk.nih.gov/health-information/diabetes/overview/diet-eating-physical-activity Summary  A healthy meal plan will help you control your blood glucose and maintain a healthy lifestyle.  Working with a diet and nutrition specialist (dietitian) can help you make a meal plan that is best for you.  Keep in mind that carbohydrates and alcohol have immediate effects on your blood glucose levels. It is important to count carbohydrates and to use alcohol carefully. This information is not intended to replace advice given to you by your health care provider. Make sure you discuss any questions you have with your health care provider. Document Released: 12/06/2004 Document Revised: 04/15/2016 Document Reviewed: 04/15/2016 Elsevier Interactive Patient Education  2018 Elsevier Inc.  

## 2017-07-31 ENCOUNTER — Encounter: Payer: Self-pay | Admitting: Family Medicine

## 2017-08-06 ENCOUNTER — Other Ambulatory Visit (HOSPITAL_COMMUNITY): Payer: Self-pay

## 2017-08-06 ENCOUNTER — Encounter (HOSPITAL_COMMUNITY): Payer: Self-pay

## 2017-08-06 NOTE — Progress Notes (Signed)
Paramedicine Encounter    Patient ID: Jacob [name], male    DOB: June 06, 1955, 62 y.o.   MRN: [id number]    Patient Care Team: Hoy Register, MD as PCP - General (Family Medicine) Clarisa Schools, RN as Registered Nurse Jena Gauss, Gerrit Friends, MD as Consulting Physician (Gastroenterology) Pleasant, Dennard Schaumann, RN as [facility] Care Management  Patient Active Problem List   Diagnosis Date Noted  . Vitamin D deficiency 07/07/2017  . Exertional angina (HCC)   . Chronic systolic heart failure (HCC) 12/17/2016  . Hyperglycemia 12/02/2016  . Osteoarthritis of right knee 12/02/2016  . Congestive heart disease (HCC) 01/01/2016  . Diabetic neuropathy (HCC) 11/28/2015  . Ascites 11/09/2015  . CHF (congestive heart failure) (HCC) 11/09/2015  . Insomnia 04/18/2015  . Claudication of right lower extremity (HCC) 04/07/2015  . Cardiomyopathy, ischemic 04/07/2015  . Chronic anticoagulation 03/30/2015  . Cardiorenal syndrome with renal failure   . SOB (shortness of breath)   . Morbid obesity due to excess calories (HCC)   . Helicobacter pylori ab+ 12/12/2014  . Calf pain   . Duodenal ulcer with hemorrhage   . Hemorrhagic shock (HCC)   . Cardiogenic shock (HCC)   . Diabetes mellitus (HCC)   . Coronary artery disease involving native coronary artery of native heart without angina pectoris   . Atrial fibrillation with rapid ventricular response (HCC) 10/29/2014  . Chest pain   . Atherosclerosis of native arteries of extremity with intermittent claudication (HCC) 09/06/2014  . Atrial fibrillation with controlled ventricular response (HCC) 08/03/2014  . Bacteremia   . DM (diabetes mellitus), type 2 with peripheral vascular complications (HCC) 08/01/2014  . Anemia of chronic disease 08/01/2014  . PVD (peripheral vascular disease) (HCC) 10/29/2013  . Tobacco abuse 05/13/2013  . AF (atrial fibrillation) (HCC) 02/11/2013  . ICD - in place- BS May 2014 [facility] 02/11/2013  . Microcytic anemia  01/11/2013  . 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 Noncompliance 12/01/2011  . Essential hypertension 10/30/2006    Current Outpatient Medications:  .  acetaminophen-codeine (TYLENOL #3) 300-30 MG tablet, Take 1 tablet by mouth at bedtime as needed for moderate pain., Disp: 30 tablet, Rfl: 0 .  allopurinol (ZYLOPRIM) 100 MG tablet, Take 2 tablets (200 mg total) by mouth daily.(AM), Disp: 60 tablet, Rfl: 3 .  atorvastatin (LIPITOR) 40 MG tablet, take 1 TABLET BY MOUTH EVERY EVENING at 6pm, Disp: 30 tablet, Rfl: 6 .  digoxin (LANOXIN) 0.125 MG tablet, Take 0.5 tablets (0.0625 mg total) by mouth daily., Disp: 14 tablet, Rfl: 3 .  gabapentin (NEURONTIN) 300 MG capsule, Take 1 capsule (300 mg total) by mouth daily., Disp: 30 capsule, Rfl: 3 .  glipiZIDE (GLUCOTROL) 10 MG tablet, Take 1 tablet (10 mg total) by mouth 2 (two) times daily before a meal., Disp: 60 tablet, Rfl: 5 .  hydrALAZINE (APRESOLINE) 25 MG tablet, take 1 TABLET BY MOUTH THREE TIMES DAILY every morning, noon,evening, Disp: 90 tablet, Rfl: 3 .  isosorbide mononitrate (IMDUR) 30 MG 24 hr tablet, Take 1 tablet (30 mg total) by mouth daily., Disp: 30 tablet, Rfl: 3 .  rivaroxaban (XARELTO) 20 MG TABS tablet, take one 20mg  tablet daily by mouth, Disp: 30 tablet, Rfl: 3 .  ACCU-CHEK SOFTCLIX LANCETS lancets, Use for once daily testing of blood sugar (Patient not taking: Reported on 06/11/2017), Disp: 100 each, Rfl: 12 .  albuterol (PROVENTIL HFA;VENTOLIN HFA) 108 (90 Base) MCG/ACT inhaler, Inhale 2 puffs into the lungs every 6 (  six) hours as needed for wheezing or shortness of breath., Disp: 1 Inhaler, Rfl: 2 .  albuterol (PROVENTIL) (2.5 MG/3ML) 0.083% nebulizer solution, Take 3 mLs (2.5 mg total) by nebulization every 6 (six) hours as needed for wheezing or shortness of breath. (Patient not taking: Reported on 06/11/2017), Disp: 150 mL, Rfl: 1 .  Blood Glucose Monitoring Suppl (ACCU-CHEK AVIVA) device, Use as instructed1 times  daily before meals (Patient not taking: Reported on 06/11/2017), Disp: 1 each, Rfl: 0 .  diclofenac sodium (VOLTAREN) 1 % GEL, Apply 4 g topically 4 (four) times daily. 100/16=7, Disp: 100 g, Rfl: 1 .  ergocalciferol (DRISDOL) 50000 units capsule, Take 1 capsule (50,000 Units total) by mouth once a week., Disp: 4 capsule, Rfl: 1 .  glucose blood (ACCU-CHEK AVIVA) test strip, Use as instructed for 1 times daily testing of blood sugar (Patient not taking: Reported on 06/11/2017), Disp: 100 each, Rfl: 12 .  Insulin Glargine (LANTUS) 100 UNIT/ML Solostar Pen, Inject 50 Units into the skin daily at 10 pm., Disp: 5 pen, Rfl: 5 .  Lancet Devices (ACCU-CHEK SOFTCLIX) lancets, Use as instructed for once daily testing of blood sugar (Patient not taking: Reported on 06/02/2017), Disp: 1 each, Rfl: 0 .  meclizine (ANTIVERT) 25 MG tablet, Take 1 tablet (25 mg total) by mouth 3 (three) times daily as needed for dizziness., Disp: 60 tablet, Rfl: 1 .  metolazone (ZAROXOLYN) 2.5 MG tablet, Take 1 tablet once as directed by CHF clinic., Disp: 5 tablet, Rfl: 0 .  torsemide (DEMADEX) 20 MG tablet, Take 4 tablets (80 mg total) by mouth 2 (two) times daily., Disp: 240 tablet, Rfl: 3 .  traZODone (DESYREL) 100 MG tablet, Take 1 tablet (100 mg total) by mouth at bedtime as needed for sleep. (Patient not taking: Reported on 06/11/2017), Disp: 30 tablet, Rfl: 3 Allergies  Allergen Reactions  . Pork-Derived Products Other (See Comments)    Religious preference     Social History   Socioeconomic History  . Marital status: Divorced    Spouse name: Not on file  . Number of children: 3  . Years of education: Not on file  . Highest education level: Not on file  Occupational History  . Occupation: disabled  Social Needs  . Financial resource strain: Not on file  . Food insecurity:    Worry: Not on file    Inability: Not on file  . Transportation needs:    Medical: Not on file    Non-medical: Not on file  Tobacco Use   . Smoking status: Current Every Day Smoker    Packs/day: 0.50    Years: 40.00    Pack years: 20.00    Types: Cigarettes  . Smokeless tobacco: Never Used  Substance and Sexual Activity  . Alcohol use: No    Alcohol/week: 0.0 oz  . Drug use: No  . Sexual activity: Never  Lifestyle  . Physical activity:    Days per week: Not on file    Minutes per session: Not on file  . Stress: Not on file  Relationships  . Social connections:    Talks on phone: Not on file    Gets together: Not on file    Attends religious service: Not on file    Active member of club or organization: Not on file    Attends meetings of clubs or organizations: Not on file    Relationship status: Not on file  . Intimate partner violence:    Fear of current  or ex partner: Not on file    Emotionally abused: Not on file    Physically abused: Not on file    Forced sexual activity: Not on file  Other Topics Concern  . Not on file  Social History Narrative   Has an apartment with a roommate. He was living on the streets in 01-06-2013.  He reports that his father died in [place] in 2013-01-06.  He is divorced.  He is no longer estranged from his son, but still from his daughter who lives locally.  Neither of his parents, nor any siblings have any history of CAD.    Physical Exam  Pulmonary/Chest: Effort normal. No respiratory distress.  Abdominal: Soft. He exhibits distension. There is no tenderness.  Musculoskeletal: He exhibits no edema.  Skin: Skin is warm and dry. He is not diaphoretic.        Future Appointments  Date Time Provider Department Center  08/11/2017  2:30 PM [facility] PA/NP [facility] None  08/13/2017  1:00 PM [facility] 4 - SS [facility]  08/13/2017  2:00 PM Fransisco Hertz, MD [facility]  11/05/2017  2:30 PM Hoy Register, MD [facility] None    ATF pt CAO x4 sitting on the edge of his bed with no complaints. Pt stated that he just woke up from a nap.  [facility] delivered pt's rx  bottles instead of bubble packs, therefore I had to fix pt's pill box this week.  Besides his pill box, pt has no issues with taking his medications.  Pt will be re-evaluated next month for [facility] discharge.    Pt denies sob, dizziness and chest pain.  Pt has pain reliever for chronic knee pain. Pt's vitals noted.  I will bring another pill box next week so the visits will be bi-weekly.  BP 112/70 (BP Location: Right Arm, Patient Position: Sitting, Cuff Size: Large)   Pulse (!) 103   Resp 16   Wt 250 lb 12.8 oz (113.8 kg)   SpO2 97%   BMI 41.74 kg/m   Weight yesterday-didn't weigh Last visit weight-250    Teruo Stilley, EMT Paramedic 08/06/2017    ACTION: Home visit completed

## 2017-08-11 ENCOUNTER — Encounter (HOSPITAL_COMMUNITY): Payer: Medicare Other

## 2017-08-13 ENCOUNTER — Encounter (HOSPITAL_COMMUNITY): Payer: Medicare Other

## 2017-08-13 ENCOUNTER — Encounter: Payer: Medicare Other | Admitting: Vascular Surgery

## 2017-08-26 ENCOUNTER — Inpatient Hospital Stay (HOSPITAL_COMMUNITY): Admission: RE | Admit: 2017-08-26 | Payer: Medicare Other | Source: Ambulatory Visit

## 2017-08-27 ENCOUNTER — Telehealth: Payer: Self-pay

## 2017-08-27 NOTE — Telephone Encounter (Signed)
As per Ranee Gosselin, [employer] [place] they are not providing any additional assistance. The patient needed to apply for SSI and he stated that he did.   The patient had inquired if there is another #, besides he main # [phone],  to call DSS to schedule transportation. ThIs CM informed him that this CM has checked with DSS and was informed that there is no other # to use to schedule transportation.  He stated that he is " doing good."  He has not checked his blood sugar this morning yet. His weight this morning was 251 lbs.  He reported that he has his medications and is taking them as ordered.

## 2017-09-02 ENCOUNTER — Other Ambulatory Visit (HOSPITAL_COMMUNITY): Payer: Self-pay

## 2017-09-02 ENCOUNTER — Encounter (HOSPITAL_COMMUNITY): Payer: Self-pay

## 2017-09-02 NOTE — Progress Notes (Signed)
Paramedicine Encounter    Patient ID: Jacob [name], male    DOB: 30-May-1955, 62 y.o.   MRN: [id number]    Patient Care Team: Hoy Register, MD as PCP - General (Family Medicine) Clarisa Schools, RN as Registered Nurse Jena Gauss, Gerrit Friends, MD as Consulting Physician (Gastroenterology) Pleasant, Dennard Schaumann, RN as [facility] Care Management  Patient Active Problem List   Diagnosis Date Noted  . Vitamin D deficiency 07/07/2017  . Exertional angina (HCC)   . Chronic systolic heart failure (HCC) 12/17/2016  . Hyperglycemia 12/02/2016  . Osteoarthritis of right knee 12/02/2016  . Congestive heart disease (HCC) 01/01/2016  . Diabetic neuropathy (HCC) 11/28/2015  . Ascites 11/09/2015  . CHF (congestive heart failure) (HCC) 11/09/2015  . Insomnia 04/18/2015  . Claudication of right lower extremity (HCC) 04/07/2015  . Cardiomyopathy, ischemic 04/07/2015  . Chronic anticoagulation 03/30/2015  . Cardiorenal syndrome with renal failure   . SOB (shortness of breath)   . Morbid obesity due to excess calories (HCC)   . Helicobacter pylori ab+ 12/12/2014  . Calf pain   . Duodenal ulcer with hemorrhage   . Hemorrhagic shock (HCC)   . Cardiogenic shock (HCC)   . Diabetes mellitus (HCC)   . Coronary artery disease involving native coronary artery of native heart without angina pectoris   . Atrial fibrillation with rapid ventricular response (HCC) 10/29/2014  . Chest pain   . Atherosclerosis of native arteries of extremity with intermittent claudication (HCC) 09/06/2014  . Atrial fibrillation with controlled ventricular response (HCC) 08/03/2014  . Bacteremia   . DM (diabetes mellitus), type 2 with peripheral vascular complications (HCC) 08/01/2014  . Anemia of chronic disease 08/01/2014  . PVD (peripheral vascular disease) (HCC) 10/29/2013  . Tobacco abuse 05/13/2013  . AF (atrial fibrillation) (HCC) 02/11/2013  . ICD - in place- BS May 2014 [facility] 02/11/2013  . Microcytic anemia  01/11/2013  . 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 Noncompliance 12/01/2011  . Essential hypertension 10/30/2006    Current Outpatient Medications:  .  ACCU-CHEK SOFTCLIX LANCETS lancets, Use for once daily testing of blood sugar (Patient not taking: Reported on 06/11/2017), Disp: 100 each, Rfl: 12 .  acetaminophen-codeine (TYLENOL #3) 300-30 MG tablet, Take 1 tablet by mouth at bedtime as needed for moderate pain., Disp: 30 tablet, Rfl: 0 .  albuterol (PROVENTIL HFA;VENTOLIN HFA) 108 (90 Base) MCG/ACT inhaler, Inhale 2 puffs into the lungs every 6 (six) hours as needed for wheezing or shortness of breath., Disp: 1 Inhaler, Rfl: 2 .  albuterol (PROVENTIL) (2.5 MG/3ML) 0.083% nebulizer solution, Take 3 mLs (2.5 mg total) by nebulization every 6 (six) hours as needed for wheezing or shortness of breath. (Patient not taking: Reported on 06/11/2017), Disp: 150 mL, Rfl: 1 .  allopurinol (ZYLOPRIM) 100 MG tablet, Take 2 tablets (200 mg total) by mouth daily.(AM), Disp: 60 tablet, Rfl: 3 .  atorvastatin (LIPITOR) 40 MG tablet, take 1 TABLET BY MOUTH EVERY EVENING at 6pm, Disp: 30 tablet, Rfl: 6 .  Blood Glucose Monitoring Suppl (ACCU-CHEK AVIVA) device, Use as instructed1 times daily before meals (Patient not taking: Reported on 06/11/2017), Disp: 1 each, Rfl: 0 .  diclofenac sodium (VOLTAREN) 1 % GEL, Apply 4 g topically 4 (four) times daily. 100/16=7, Disp: 100 g, Rfl: 1 .  digoxin (LANOXIN) 0.125 MG tablet, Take 0.5 tablets (0.0625 mg total) by mouth daily., Disp: 14 tablet, Rfl: 3 .  ergocalciferol (DRISDOL) 50000 units capsule, Take 1 capsule (50,000  Units total) by mouth once a week., Disp: 4 capsule, Rfl: 1 .  gabapentin (NEURONTIN) 300 MG capsule, Take 1 capsule (300 mg total) by mouth daily., Disp: 30 capsule, Rfl: 3 .  glipiZIDE (GLUCOTROL) 10 MG tablet, Take 1 tablet (10 mg total) by mouth 2 (two) times daily before a meal., Disp: 60 tablet, Rfl: 5 .  glucose blood (ACCU-CHEK  AVIVA) test strip, Use as instructed for 1 times daily testing of blood sugar (Patient not taking: Reported on 06/11/2017), Disp: 100 each, Rfl: 12 .  hydrALAZINE (APRESOLINE) 25 MG tablet, take 1 TABLET BY MOUTH THREE TIMES DAILY every morning, noon,evening, Disp: 90 tablet, Rfl: 3 .  Insulin Glargine (LANTUS) 100 UNIT/ML Solostar Pen, Inject 50 Units into the skin daily at 10 pm., Disp: 5 pen, Rfl: 5 .  isosorbide mononitrate (IMDUR) 30 MG 24 hr tablet, Take 1 tablet (30 mg total) by mouth daily., Disp: 30 tablet, Rfl: 3 .  Lancet Devices (ACCU-CHEK SOFTCLIX) lancets, Use as instructed for once daily testing of blood sugar (Patient not taking: Reported on 06/02/2017), Disp: 1 each, Rfl: 0 .  meclizine (ANTIVERT) 25 MG tablet, Take 1 tablet (25 mg total) by mouth 3 (three) times daily as needed for dizziness., Disp: 60 tablet, Rfl: 1 .  metolazone (ZAROXOLYN) 2.5 MG tablet, Take 1 tablet once as directed by CHF clinic., Disp: 5 tablet, Rfl: 0 .  rivaroxaban (XARELTO) 20 MG TABS tablet, take one 20mg  tablet daily by mouth, Disp: 30 tablet, Rfl: 3 .  torsemide (DEMADEX) 20 MG tablet, Take 4 tablets (80 mg total) by mouth 2 (two) times daily., Disp: 240 tablet, Rfl: 3 .  traZODone (DESYREL) 100 MG tablet, Take 1 tablet (100 mg total) by mouth at bedtime as needed for sleep. (Patient not taking: Reported on 06/11/2017), Disp: 30 tablet, Rfl: 3 Allergies  Allergen Reactions  . Pork-Derived Products Other (See Comments)    Religious preference     Social History   Socioeconomic History  . Marital status: Divorced    Spouse name: Not on file  . Number of children: 3  . Years of education: Not on file  . Highest education level: Not on file  Occupational History  . Occupation: disabled  Social Needs  . Financial resource strain: Not on file  . Food insecurity:    Worry: Not on file    Inability: Not on file  . Transportation needs:    Medical: Not on file    Non-medical: Not on file  Tobacco  Use  . Smoking status: Current Every Day Smoker    Packs/day: 0.50    Years: 40.00    Pack years: 20.00    Types: Cigarettes  . Smokeless tobacco: Never Used  Substance and Sexual Activity  . Alcohol use: No    Alcohol/week: 0.0 oz  . Drug use: No  . Sexual activity: Never  Lifestyle  . Physical activity:    Days per week: Not on file    Minutes per session: Not on file  . Stress: Not on file  Relationships  . Social connections:    Talks on phone: Not on file    Gets together: Not on file    Attends religious service: Not on file    Active member of club or organization: Not on file    Attends meetings of clubs or organizations: Not on file    Relationship status: Not on file  . Intimate partner violence:    Fear of current  or ex partner: Not on file    Emotionally abused: Not on file    Physically abused: Not on file    Forced sexual activity: Not on file  Other Topics Concern  . Not on file  Social History Narrative   Has an apartment with a roommate. He was living on the streets in 09-Jan-2013.  He reports that his father died in [place] in 09-Jan-2013.  He is divorced.  He is no longer estranged from his son, but still from his daughter who lives locally.  Neither of his parents, nor any siblings have any history of CAD.    Physical Exam  Pulmonary/Chest: Effort normal. No respiratory distress. He has no wheezes. He has no rales.  Abdominal: There is no tenderness. There is no guarding.  Musculoskeletal: He exhibits edema.  Edema noted to both ankles  Skin: Skin is warm and dry. He is not diaphoretic.        Future Appointments  Date Time Provider Department Center  09/29/2017 11:00 AM Nada Libman, MD [facility]  11/05/2017  2:30 PM Hoy Register, MD [facility] None    ATF pt CAO x4 sitting on the edge of his bed smoking a cigarette. Pt called yesterday and stated that he was out of medications.  According to the pharmacist, pt asked the pharmacist to  send him the pill bottles instead of the bubble packs.  I set the bubble pack delivery up to reduce CHP visits due to pt being able to care for himself and he understands CHF and how to stay out of the hospital.  Pt also has a PCP and SW with [facility].  Pt stated that he hasn't taken his medications in a week except the torsemide and he took 3 metolazone with medical consult.  He stated that his weight was up to 269 lbs therefore he knew what to do.  Pt denies sob, dizziness and chest pain.  He stated that his home aide is still coming to help with cleaning and cooking although is apartment is in poor conditions.  rx pill box refilled.    I followed up [facility], whom told me pt's request.  I will speak with pt about the pill bubble packs and a resolution to this issue.  If he insists on getting the medications in the bottles I will f/u with Annice Pih and Bard Herbert, due to pt was due to be discharged from the [facility] program this month.  I will follow up with [facility] about pt taking metolazone last week.  BP 118/70 (BP Location: Right Arm, Patient Position: Sitting, Cuff Size: Large)   Pulse (!) 114   Resp 16   Wt 236 lb (107 kg)   SpO2 98%   BMI 39.27 kg/m  CBG 290  Weight yesterday-didn't weigh Last visit weight-250    Etosha Wetherell, EMT Paramedic 09/02/2017    ACTION: Home visit completed

## 2017-09-08 ENCOUNTER — Other Ambulatory Visit: Payer: Self-pay | Admitting: Family Medicine

## 2017-09-08 DIAGNOSIS — M1711 Unilateral primary osteoarthritis, right knee: Secondary | ICD-10-CM

## 2017-09-12 ENCOUNTER — Emergency Department (HOSPITAL_COMMUNITY): Payer: Medicare Other

## 2017-09-12 ENCOUNTER — Other Ambulatory Visit: Payer: Self-pay

## 2017-09-12 ENCOUNTER — Encounter (HOSPITAL_COMMUNITY): Payer: Self-pay | Admitting: Pharmacy Technician

## 2017-09-12 ENCOUNTER — Observation Stay (HOSPITAL_COMMUNITY)
Admission: EM | Admit: 2017-09-12 | Discharge: 2017-09-13 | Disposition: A | Discharge status: Home / Self Care | Payer: Medicare Other | Attending: Internal Medicine | Admitting: Internal Medicine

## 2017-09-12 DIAGNOSIS — Z95810 Presence of automatic (implantable) cardiac defibrillator: Secondary | ICD-10-CM | POA: Diagnosis present

## 2017-09-12 DIAGNOSIS — E1165 Type 2 diabetes mellitus with hyperglycemia: Secondary | ICD-10-CM | POA: Insufficient documentation

## 2017-09-12 DIAGNOSIS — Z951 Presence of aortocoronary bypass graft: Secondary | ICD-10-CM | POA: Insufficient documentation

## 2017-09-12 DIAGNOSIS — I110 Hypertensive heart disease with heart failure: Secondary | ICD-10-CM | POA: Diagnosis not present

## 2017-09-12 DIAGNOSIS — E876 Hypokalemia: Secondary | ICD-10-CM | POA: Diagnosis not present

## 2017-09-12 DIAGNOSIS — I2510 Atherosclerotic heart disease of native coronary artery without angina pectoris: Secondary | ICD-10-CM | POA: Diagnosis present

## 2017-09-12 DIAGNOSIS — I10 Essential (primary) hypertension: Secondary | ICD-10-CM | POA: Diagnosis present

## 2017-09-12 DIAGNOSIS — Z596 Low income: Secondary | ICD-10-CM | POA: Diagnosis not present

## 2017-09-12 DIAGNOSIS — I4891 Unspecified atrial fibrillation: Secondary | ICD-10-CM | POA: Diagnosis not present

## 2017-09-12 DIAGNOSIS — F17210 Nicotine dependence, cigarettes, uncomplicated: Secondary | ICD-10-CM | POA: Insufficient documentation

## 2017-09-12 DIAGNOSIS — Z7901 Long term (current) use of anticoagulants: Secondary | ICD-10-CM

## 2017-09-12 DIAGNOSIS — Z794 Long term (current) use of insulin: Secondary | ICD-10-CM | POA: Insufficient documentation

## 2017-09-12 DIAGNOSIS — I252 Old myocardial infarction: Secondary | ICD-10-CM | POA: Diagnosis not present

## 2017-09-12 DIAGNOSIS — R079 Chest pain, unspecified: Secondary | ICD-10-CM | POA: Diagnosis not present

## 2017-09-12 DIAGNOSIS — E7800 Pure hypercholesterolemia, unspecified: Secondary | ICD-10-CM | POA: Diagnosis not present

## 2017-09-12 DIAGNOSIS — I491 Atrial premature depolarization: Secondary | ICD-10-CM | POA: Diagnosis not present

## 2017-09-12 DIAGNOSIS — R918 Other nonspecific abnormal finding of lung field: Secondary | ICD-10-CM | POA: Diagnosis not present

## 2017-09-12 DIAGNOSIS — I482 Chronic atrial fibrillation: Secondary | ICD-10-CM | POA: Insufficient documentation

## 2017-09-12 DIAGNOSIS — R0902 Hypoxemia: Secondary | ICD-10-CM | POA: Diagnosis not present

## 2017-09-12 DIAGNOSIS — R0789 Other chest pain: Secondary | ICD-10-CM | POA: Diagnosis not present

## 2017-09-12 DIAGNOSIS — I509 Heart failure, unspecified: Secondary | ICD-10-CM

## 2017-09-12 DIAGNOSIS — D638 Anemia in other chronic diseases classified elsewhere: Secondary | ICD-10-CM | POA: Diagnosis present

## 2017-09-12 DIAGNOSIS — I5022 Chronic systolic (congestive) heart failure: Secondary | ICD-10-CM | POA: Diagnosis not present

## 2017-09-12 DIAGNOSIS — E1140 Type 2 diabetes mellitus with diabetic neuropathy, unspecified: Secondary | ICD-10-CM | POA: Diagnosis not present

## 2017-09-12 DIAGNOSIS — Z955 Presence of coronary angioplasty implant and graft: Secondary | ICD-10-CM | POA: Diagnosis not present

## 2017-09-12 DIAGNOSIS — Z79899 Other long term (current) drug therapy: Secondary | ICD-10-CM | POA: Diagnosis not present

## 2017-09-12 LAB — BASIC METABOLIC PANEL
ANION GAP: 9 (ref 5–15)
BUN: 20 mg/dL (ref 6–20)
CALCIUM: 7.9 mg/dL — AB (ref 8.9–10.3)
CO2: 30 mmol/L (ref 22–32)
Chloride: 97 mmol/L — ABNORMAL LOW (ref 101–111)
Creatinine, Ser: 1.17 mg/dL (ref 0.61–1.24)
Glucose, Bld: 102 mg/dL — ABNORMAL HIGH (ref 65–99)
POTASSIUM: 2.7 mmol/L — AB (ref 3.5–5.1)
Sodium: 136 mmol/L (ref 135–145)

## 2017-09-12 LAB — CBC
HCT: 27.7 % — ABNORMAL LOW (ref 39.0–52.0)
HEMOGLOBIN: 7.4 g/dL — AB (ref 13.0–17.0)
MCH: 18.7 pg — AB (ref 26.0–34.0)
MCHC: 26.7 g/dL — AB (ref 30.0–36.0)
MCV: 70.1 fL — ABNORMAL LOW (ref 78.0–100.0)
Platelets: 260 10*3/uL (ref 150–400)
RBC: 3.95 MIL/uL — ABNORMAL LOW (ref 4.22–5.81)
RDW: 22.1 % — AB (ref 11.5–15.5)
WBC: 13.3 10*3/uL — ABNORMAL HIGH (ref 4.0–10.5)

## 2017-09-12 LAB — RETICULOCYTES
RBC.: 3.74 MIL/uL — AB (ref 4.22–5.81)
RETIC COUNT ABSOLUTE: 89.8 10*3/uL (ref 19.0–186.0)
Retic Ct Pct: 2.4 % (ref 0.4–3.1)

## 2017-09-12 LAB — IRON AND TIBC
Iron: 18 ug/dL — ABNORMAL LOW (ref 45–182)
Saturation Ratios: 5 % — ABNORMAL LOW (ref 17.9–39.5)
TIBC: 328 ug/dL (ref 250–450)
UIBC: 310 ug/dL

## 2017-09-12 LAB — PREPARE RBC (CROSSMATCH)

## 2017-09-12 LAB — MAGNESIUM: MAGNESIUM: 1.5 mg/dL — AB (ref 1.7–2.4)

## 2017-09-12 LAB — FERRITIN: FERRITIN: 14 ng/mL — AB (ref 24–336)

## 2017-09-12 LAB — I-STAT TROPONIN, ED: TROPONIN I, POC: 0.02 ng/mL (ref 0.00–0.08)

## 2017-09-12 LAB — TROPONIN I: Troponin I: 0.06 ng/mL (ref <0.03)

## 2017-09-12 LAB — VITAMIN B12: VITAMIN B 12: 547 pg/mL (ref 180–914)

## 2017-09-12 MED ORDER — INSULIN ASPART 100 UNIT/ML ~~LOC~~ SOLN
0.0000 [IU] | Freq: Three times a day (TID) | SUBCUTANEOUS | Status: DC
  Administered 2017-09-13: 1 [IU] via SUBCUTANEOUS

## 2017-09-12 MED ORDER — ACETAMINOPHEN 325 MG PO TABS: 650.0000 mg | ORAL_TABLET | ORAL | Status: DC | PRN

## 2017-09-12 MED ORDER — SODIUM CHLORIDE 0.9% IV SOLUTION: Freq: Once | INTRAVENOUS | Status: DC

## 2017-09-12 MED ORDER — POTASSIUM CHLORIDE 10 MEQ/100ML IV SOLN
10.0000 meq | INTRAVENOUS | Status: AC
  Administered 2017-09-12 – 2017-09-13 (×3): 10 meq via INTRAVENOUS
  Filled 2017-09-12 (×3): qty 100

## 2017-09-12 MED ORDER — TORSEMIDE 20 MG PO TABS: 80.0000 mg | ORAL_TABLET | Freq: Two times a day (BID) | ORAL | Status: DC

## 2017-09-12 MED ORDER — ISOSORBIDE MONONITRATE ER 30 MG PO TB24
30.0000 mg | ORAL_TABLET | Freq: Every day | ORAL | Status: DC
  Administered 2017-09-13: 30 mg via ORAL
  Filled 2017-09-12: qty 1

## 2017-09-12 MED ORDER — MORPHINE SULFATE (PF) 4 MG/ML IV SOLN
2.0000 mg | INTRAVENOUS | Status: DC | PRN
  Administered 2017-09-12: 2 mg via INTRAVENOUS
  Filled 2017-09-12: qty 1

## 2017-09-12 MED ORDER — POTASSIUM CHLORIDE 10 MEQ/100ML IV SOLN
10.0000 meq | Freq: Once | INTRAVENOUS | Status: AC
  Administered 2017-09-12: 10 meq via INTRAVENOUS
  Filled 2017-09-12: qty 100

## 2017-09-12 MED ORDER — ALBUTEROL SULFATE (2.5 MG/3ML) 0.083% IN NEBU: 2.5000 mg | INHALATION_SOLUTION | Freq: Four times a day (QID) | RESPIRATORY_TRACT | Status: DC | PRN

## 2017-09-12 MED ORDER — NITROGLYCERIN 0.4 MG SL SUBL
0.4000 mg | SUBLINGUAL_TABLET | SUBLINGUAL | Status: DC | PRN
  Administered 2017-09-12: 0.4 mg via SUBLINGUAL
  Filled 2017-09-12: qty 1

## 2017-09-12 MED ORDER — ONDANSETRON HCL 4 MG/2ML IJ SOLN: 4.0000 mg | Freq: Four times a day (QID) | INTRAMUSCULAR | Status: DC | PRN

## 2017-09-12 NOTE — ED Provider Notes (Addendum)
[name] EMERGENCY DEPARTMENT Provider Note   CSN: [id number] Arrival date & time: 09/12/17  1747     History   Chief Complaint Chief Complaint  Patient presents with  . Chest Pain    HPI [name] is a 62 y.o. male.  62 year old male with prior history of CAD, CHF, AICD placement, A. fib, high collateral, hypertension, MI who presents with complaint of chest pain.  Patient reports that he had substernal chest pressure earlier today.  Pain lasted approximately 3 hours.  Symptoms are improved now.  He is pain-free upon my evaluation.  Patient reports that he does not typically get chest pain.  The history is provided by the patient.  Chest Pain   This is a new problem. The current episode started 3 to 5 hours ago. The problem occurs rarely. The problem has been resolved. The pain is mild. The quality of the pain is described as pressure-like. The pain does not radiate. He has tried nitroglycerin for the symptoms. The treatment provided significant relief.    Past Medical History:  Diagnosis Date  . Atrial fibrillation (HCC)    RVR 10/2014  . Automatic implantable cardioverter-defibrillator in situ   . CAD (coronary artery disease) Sept 2013   s/p cardiac cath showing occlusion of small RCA with collaterals  . CHF (congestive heart failure) (HCC)    20 to 25 % EF and RV dysfunction by 07/2014 echo   . Chronic anticoagulation    on xarelto.   . High cholesterol   . Hypertension   . Myocardial infarction (HCC) 2014  . Noncompliance    homelessness contributing.   . Peripheral arterial disease (HCC)   . Type II diabetes mellitus [facility])     Patient Active Problem List   Diagnosis Date Noted  . Hypokalemia   . Vitamin D deficiency 07/07/2017  . Exertional angina (HCC)   . Chronic systolic heart failure (HCC) 12/17/2016  . Hyperglycemia 12/02/2016  . Osteoarthritis of right knee 12/02/2016  . Congestive heart disease (HCC) 01/01/2016  . Diabetic  neuropathy (HCC) 11/28/2015  . Ascites 11/09/2015  . CHF (congestive heart failure) (HCC) 11/09/2015  . Insomnia 04/18/2015  . Claudication of right lower extremity (HCC) 04/07/2015  . Cardiomyopathy, ischemic 04/07/2015  . Chronic anticoagulation 03/30/2015  . Cardiorenal syndrome with renal failure   . SOB (shortness of breath)   . Morbid obesity due to excess calories (HCC)   . Helicobacter pylori ab+ 12/12/2014  . Calf pain   . Duodenal ulcer with hemorrhage   . Hemorrhagic shock (HCC)   . Cardiogenic shock (HCC)   . Diabetes mellitus (HCC)   . Coronary artery disease involving native coronary artery of native heart without angina pectoris   . Atrial fibrillation with rapid ventricular response (HCC) 10/29/2014  . Chest pain   . Atherosclerosis of native arteries of extremity with intermittent claudication (HCC) 09/06/2014  . Atrial fibrillation with controlled ventricular response (HCC) 08/03/2014  . Bacteremia   . DM (diabetes mellitus), type 2 with peripheral vascular complications (HCC) 08/01/2014  . Anemia of chronic disease 08/01/2014  . PVD (peripheral vascular disease) (HCC) 10/29/2013  . Tobacco abuse 05/13/2013  . AF (atrial fibrillation) (HCC) 02/11/2013  . ICD - in place- BS May 2014 [facility] 02/11/2013  . Microcytic anemia 01/11/2013  . CAD- s/p CABG July 2014 [facility] 12/04/2011  . Noncompliance 12/01/2011  . Essential hypertension 10/30/2006    Past Surgical History:  Procedure Laterality Date  .  CARDIAC CATHETERIZATION  09/2012  . CORONARY ANGIOPLASTY WITH STENT PLACEMENT  11/2011   "1"  . CORONARY ARTERY BYPASS GRAFT  09/2012   2 vessels per patient [name])   . ESOPHAGOGASTRODUODENOSCOPY N/A 11/28/2014   Procedure: ESOPHAGOGASTRODUODENOSCOPY (EGD);  Surgeon: Beverley Fiedler, MD;  Location: [facility] ENDOSCOPY;  Service: Endoscopy;  Laterality: N/A;  . ILIAC ARTERY STENT Right 08/30/2013  . IMPLANTABLE CARDIOVERTER DEFIBRILLATOR IMPLANT     [place] in 07/2012;  [employer]  . LEFT AND RIGHT HEART CATHETERIZATION WITH CORONARY ANGIOGRAM N/A 12/02/2011   Procedure: LEFT AND RIGHT HEART CATHETERIZATION WITH CORONARY ANGIOGRAM;  Surgeon: Kathleene Hazel, MD;  Location: [facility] CATH LAB;  Service: Cardiovascular;  Laterality: N/A;  . LOWER EXTREMITY ANGIOGRAM N/A 08/30/2013   Procedure: LOWER EXTREMITY ANGIOGRAM;  Surgeon: Runell Gess, MD;  Location: [facility] CATH LAB;  Service: Cardiovascular;  Laterality: N/A;  . LOWER EXTREMITY ANGIOGRAM N/A 12/02/2013   Procedure: LOWER EXTREMITY ANGIOGRAM;  Surgeon: Runell Gess, MD;  Location: [facility] CATH LAB;  Service: Cardiovascular;  Laterality: N/A;  . RIGHT/LEFT HEART CATH AND CORONARY/GRAFT ANGIOGRAPHY N/A 04/23/2017   Procedure: RIGHT/LEFT HEART CATH AND CORONARY/GRAFT ANGIOGRAPHY;  Surgeon: Dolores Patty, MD;  Location: [facility] INVASIVE CV LAB;  Service: Cardiovascular;  Laterality: N/A;        Home Medications    Prior to Admission medications   Medication Sig Start Date End Date Taking? Authorizing Provider  ACCU-CHEK SOFTCLIX LANCETS lancets Use for once daily testing of blood sugar Patient not taking: Reported on 06/11/2017 06/02/17   Hoy Register, MD  acetaminophen-codeine (TYLENOL #3) 300-30 MG tablet TAKE 1 TABLET BY MOUTH AT BEDTIME AS NEEDED FOR MODERATE PAIN. 09/09/17   Hoy Register, MD  albuterol (PROVENTIL HFA;VENTOLIN HFA) 108 (90 Base) MCG/ACT inhaler Inhale 2 puffs into the lungs every 6 (six) hours as needed for wheezing or shortness of breath. 03/29/15   Hoy Register, MD  albuterol (PROVENTIL) (2.5 MG/3ML) 0.083% nebulizer solution Take 3 mLs (2.5 mg total) by nebulization every 6 (six) hours as needed for wheezing or shortness of breath. Patient not taking: Reported on 06/11/2017 04/18/15   Hoy Register, MD  allopurinol (ZYLOPRIM) 100 MG tablet Take 2 tablets (200 mg total) by mouth daily.(AM) 07/30/17   Hoy Register, MD  atorvastatin (LIPITOR) 40 MG tablet take 1 TABLET BY MOUTH  EVERY EVENING at 6pm 07/30/17   Hoy Register, MD  Blood Glucose Monitoring Suppl (ACCU-CHEK AVIVA) device Use as instructed1 times daily before meals Patient not taking: Reported on 06/11/2017 06/02/17   Hoy Register, MD  diclofenac sodium (VOLTAREN) 1 % GEL APPLY 4 GRAMS TOPICALLY 4 (FOUR) TIMES DAILY 09/09/17   Hoy Register, MD  digoxin (LANOXIN) 0.125 MG tablet Take 0.5 tablets (0.0625 mg total) by mouth daily. 07/30/17   Hoy Register, MD  ergocalciferol (DRISDOL) 50000 units capsule Take 1 capsule (50,000 Units total) by mouth once a week. 06/03/17   Hoy Register, MD  gabapentin (NEURONTIN) 300 MG capsule Take 1 capsule (300 mg total) by mouth daily. 07/30/17   Hoy Register, MD  glipiZIDE (GLUCOTROL) 10 MG tablet Take 1 tablet (10 mg total) by mouth 2 (two) times daily before a meal. 07/30/17   Hoy Register, MD  glucose blood (ACCU-CHEK AVIVA) test strip Use as instructed for 1 times daily testing of blood sugar Patient not taking: Reported on 06/11/2017 06/02/17   Hoy Register, MD  hydrALAZINE (APRESOLINE) 25 MG tablet take 1 TABLET BY MOUTH THREE TIMES DAILY every morning, noon,evening 07/30/17  Hoy Register, MD  Insulin Glargine (LANTUS) 100 UNIT/ML Solostar Pen Inject 50 Units into the skin daily at 10 pm. 07/30/17   Hoy Register, MD  isosorbide mononitrate (IMDUR) 30 MG 24 hr tablet Take 1 tablet (30 mg total) by mouth daily. 07/30/17   Hoy Register, MD  Lancet Devices [facility]) lancets Use as instructed for once daily testing of blood sugar Patient not taking: Reported on 06/02/2017 11/28/15   Hoy Register, MD  meclizine (ANTIVERT) 25 MG tablet TAKE 1 TABLET (25 MG TOTAL) BY MOUTH 3 (THREE) TIMES DAILY AS NEEDED FOR DIZZINESS. 09/09/17   Hoy Register, MD  metolazone (ZAROXOLYN) 2.5 MG tablet Take 1 tablet once as directed by CHF clinic. 07/30/17   Hoy Register, MD  rivaroxaban (XARELTO) 20 MG TABS tablet take one 20mg  tablet daily by mouth 07/30/17   Hoy Register, MD  torsemide (DEMADEX) 20 MG tablet Take 4 tablets (80 mg total) by mouth 2 (two) times daily. 07/30/17   Hoy Register, MD  traZODone (DESYREL) 100 MG tablet Take 1 tablet (100 mg total) by mouth at bedtime as needed for sleep. Patient not taking: Reported on 06/11/2017 05/02/17   Hoy Register, MD    Family History Family History  Problem Relation Age of Onset  . Diabetes Mother     Social History Social History   Tobacco Use  . Smoking status: Current Every Day Smoker    Packs/day: 0.50    Years: 40.00    Pack years: 20.00    Types: Cigarettes  . Smokeless tobacco: Never Used  Substance Use Topics  . Alcohol use: No    Alcohol/week: 0.0 oz  . Drug use: No     Allergies   Pork-derived products   Review of Systems Review of Systems  Cardiovascular: Positive for chest pain.  All other systems reviewed and are negative.    Physical Exam Updated Vital Signs BP (!) 113/54   Pulse 100   Temp 98.2 F (36.8 C) (Oral)   Resp (!) 23   Ht 5\' 7"  (1.702 m)   Wt 108.9 kg (240 lb)   SpO2 95%   BMI 37.59 kg/m   Physical Exam  Constitutional: He is oriented to person, place, and time. He appears well-developed and well-nourished. No distress.  HENT:  Head: Normocephalic and atraumatic.  Mouth/Throat: Oropharynx is clear and moist.  Eyes: Pupils are equal, round, and reactive to light. Conjunctivae and EOM are normal.  Neck: Normal range of motion. Neck supple.  Cardiovascular: Normal rate, regular rhythm and normal heart sounds.  Pulmonary/Chest: Effort normal and breath sounds normal. No respiratory distress.  Abdominal: Soft. He exhibits no distension. There is no tenderness.  Musculoskeletal: Normal range of motion. He exhibits no edema or deformity.  Neurological: He is alert and oriented to person, place, and time.  Skin: Skin is warm and dry.  Psychiatric: He has a normal mood and affect.  Nursing note and vitals reviewed.    ED Treatments /  Results  Labs (all labs ordered are listed, but only abnormal results are displayed) Labs Reviewed  CBC - Abnormal; Notable for the following components:      Result Value   WBC 13.3 (*)    RBC 3.95 (*)    Hemoglobin 7.4 (*)    HCT 27.7 (*)    MCV 70.1 (*)    MCH 18.7 (*)    MCHC 26.7 (*)    RDW 22.1 (*)    All other components within  normal limits  BASIC METABOLIC PANEL - Abnormal; Notable for the following components:   Potassium 2.7 (*)    Chloride 97 (*)    Glucose, Bld 102 (*)    Calcium 7.9 (*)    All other components within normal limits  MAGNESIUM  VITAMIN B12  FOLATE  IRON AND TIBC  FERRITIN  RETICULOCYTES  TROPONIN I  TROPONIN I  TROPONIN I  I-STAT TROPONIN, ED  POC OCCULT BLOOD, ED  PREPARE RBC (CROSSMATCH)  TYPE AND SCREEN    EKG EKG Interpretation  Date/Time:  Friday September 12 2017 18:17:53 EDT Ventricular Rate:  99 PR Interval:    QRS Duration: 114 QT Interval:  394 QTC Calculation: 506 R Axis:   -50 Text Interpretation:  Atrial fibrillation Ventricular bigeminy LAD, consider left anterior fascicular block Borderline low voltage, extremity leads Abnormal R-wave progression, late transition Borderline repolarization abnormality Prolonged QT interval Confirmed by Kristine Royal [phone]) on 09/12/2017 6:22:33 PM   Radiology Dg Chest 2 View  Result Date: 09/12/2017 CLINICAL DATA:  Pt arrives via EMS from home with reports of central CP onset approx 1530 today. 7/10 with SOB. Pt with extensive cardiac hx. afib with frequent PVC's with EMS. Does have pacer, not in paced rhythm. Hx of HTN, diabetes, MI, CAD, CHF, AFIB. Current everyday smoker- 0.5 ppd. EXAM: CHEST - 2 VIEW COMPARISON:  06/11/2017 FINDINGS: There stable changes from prior CABG surgery. The cardiac silhouette is normal in size. No mediastinal or hilar masses. No convincing adenopathy. There is opacity at the right posterolateral lung base, which was present on the prior exam. There are prominent  bronchovascular markings. Lungs otherwise clear. No pleural effusion.  No pneumothorax. Left anterior chest wall single lead pacemaker is stable. Skeletal structures are intact. IMPRESSION: 1. Opacity at the posterolateral aspect of the right lung base, which was present on the prior exam. This may represent pneumonia or atelectasis. This was not present on a high-resolution chest CT from 04/22/2017. 2. No other evidence of acute cardiopulmonary disease. Electronically Signed   By: Amie Portland M.D.   On: 09/12/2017 19:04    Procedures Procedures (including critical care time)  Medications Ordered in ED Medications  potassium chloride 10 mEq in 100 mL IVPB (has no administration in time range)  albuterol (PROVENTIL HFA;VENTOLIN HFA) 108 (90 Base) MCG/ACT inhaler 2 puff (has no administration in time range)  isosorbide mononitrate (IMDUR) 24 hr tablet 30 mg (has no administration in time range)  torsemide (DEMADEX) tablet 80 mg (has no administration in time range)  acetaminophen (TYLENOL) tablet 650 mg (has no administration in time range)  ondansetron (ZOFRAN) injection 4 mg (has no administration in time range)  insulin aspart (novoLOG) injection 0-9 Units (has no administration in time range)  0.9 %  sodium chloride infusion (Manually program via Guardrails IV Fluids) (has no administration in time range)  potassium chloride 10 mEq in 100 mL IVPB (0 mEq Intravenous Stopped 09/12/17 2112)     Initial Impression / Assessment and Plan / ED Course  I have reviewed the triage vital signs and the nursing notes.  Pertinent labs & imaging results that were available during my care of the patient were reviewed by me and considered in my medical decision making (see chart for details).     MDM  Screen complete  Patient is presenting with complaint of chest pain.  Patient's pain is resolved prior to arrival.  EKG does not suggest acute ischemia.  Initial troponin is reassuring.  Other  screening labs are significant for hypokalemia and anemia.  Patient without reported GI bleeding and he refuses stool guaiac.  Patient requires admission for further work-up and treatment of reported chest pain.  Hospitalist service is aware of case and will evaluate for admission.  Final Clinical Impressions(s) / ED Diagnoses   Final diagnoses:  Chest pain, unspecified type  Hypokalemia    ED Discharge Orders    None       Wynetta Fines, MD 09/12/17 2149    Wynetta Fines, MD 09/12/17 2149

## 2017-09-12 NOTE — ED Notes (Signed)
Pt currently has no chest pain 

## 2017-09-12 NOTE — Progress Notes (Signed)
CRITICAL VALUE ALERT  Critical Value:  Troponin 0.06  Date & Time Notied:  09/12/17 23:50  Provider Notified: Bruna Potter  Orders Received/Actions taken: pending

## 2017-09-12 NOTE — ED Notes (Signed)
Pt refused test for occult blood sample

## 2017-09-12 NOTE — ED Triage Notes (Signed)
Pt arrives via EMS from home with reports of central CP onset approx 1530 today. 7/10 with SOB. Pt with extensive cardiac hx. afib with frequent PVC's with EMS. Does have pacer, not in paced rhythm. 20g Lhand. Given 324mg  aspirin, 1 SL nitro with improvement.  BP 140/84 P 122 RR 18 96% RA

## 2017-09-12 NOTE — ED Notes (Signed)
Pt returned from X-ray.  

## 2017-09-12 NOTE — H&P (Signed)
History and Physical    Jacob [name] ZOX:[id number] DOB: [birth year] DOA: 09/12/2017  PCP: Hoy Register, MD  Patient coming from: Home  Chief Complaint: Chest pain  HPI: Jacob [name] is a 62 y.o. male with medical history significant of congestive heart failure with EF of 20%, AICD, A. fib on Xarelto, hypertension, peripheral vascular disease comes in with substernal chest pain that happened earlier that lasted over an hour that did not radiate anywhere.  He did not have any associated shortness of breath or nausea vomiting with it.  He is never had chest pain before in the past.  He took a nitroglycerin that he had at home which resolved his symptoms.  Patient denies any lower extremity edema or swelling.  He denies any fevers, cough, PND, orthopnea.  Patient is being referred for admission for chest pain work-up.  Review of Systems: As per HPI otherwise 10 point review of systems negative.   Past Medical History:  Diagnosis Date  . Atrial fibrillation (HCC)    RVR 10/2014  . Automatic implantable cardioverter-defibrillator in situ   . CAD (coronary artery disease) Sept 2013   s/p cardiac cath showing occlusion of small RCA with collaterals  . CHF (congestive heart failure) (HCC)    20 to 25 % EF and RV dysfunction by 07/2014 echo   . Chronic anticoagulation    on xarelto.   . High cholesterol   . Hypertension   . Myocardial infarction (HCC) 2014  . Noncompliance    homelessness contributing.   . Peripheral arterial disease (HCC)   . Type II diabetes mellitus (HCC)     Past Surgical History:  Procedure Laterality Date  . CARDIAC CATHETERIZATION  09/2012  . CORONARY ANGIOPLASTY WITH STENT PLACEMENT  11/2011   "1"  . CORONARY ARTERY BYPASS GRAFT  09/2012   2 vessels per patient [name]   . ESOPHAGOGASTRODUODENOSCOPY N/A 11/28/2014   Procedure: ESOPHAGOGASTRODUODENOSCOPY (EGD);  Surgeon: Beverley Fiedler, MD;  Location: [facility] ENDOSCOPY;  Service: Endoscopy;  Laterality: N/A;  . ILIAC  ARTERY STENT Right 08/30/2013  . IMPLANTABLE CARDIOVERTER DEFIBRILLATOR IMPLANT     [place] in 07/2012; [employer]  . LEFT AND RIGHT HEART CATHETERIZATION WITH CORONARY ANGIOGRAM N/A 12/02/2011   Procedure: LEFT AND RIGHT HEART CATHETERIZATION WITH CORONARY ANGIOGRAM;  Surgeon: Kathleene Hazel, MD;  Location: [facility] CATH LAB;  Service: Cardiovascular;  Laterality: N/A;  . LOWER EXTREMITY ANGIOGRAM N/A 08/30/2013   Procedure: LOWER EXTREMITY ANGIOGRAM;  Surgeon: Runell Gess, MD;  Location: [facility] CATH LAB;  Service: Cardiovascular;  Laterality: N/A;  . LOWER EXTREMITY ANGIOGRAM N/A 12/02/2013   Procedure: LOWER EXTREMITY ANGIOGRAM;  Surgeon: Runell Gess, MD;  Location: [facility] CATH LAB;  Service: Cardiovascular;  Laterality: N/A;  . RIGHT/LEFT HEART CATH AND CORONARY/GRAFT ANGIOGRAPHY N/A 04/23/2017   Procedure: RIGHT/LEFT HEART CATH AND CORONARY/GRAFT ANGIOGRAPHY;  Surgeon: Dolores Patty, MD;  Location: [facility] INVASIVE CV LAB;  Service: Cardiovascular;  Laterality: N/A;     reports that he has been smoking cigarettes.  He has a 20.00 pack-year smoking history. He has never used smokeless tobacco. He reports that he does not drink alcohol or use drugs.  Allergies  Allergen Reactions  . Pork-Derived Products Other (See Comments)    Religious preference    Family History  Problem Relation Age of Onset  . Diabetes Mother     Prior to Admission medications   Medication Sig Start Date End Date Taking? Authorizing Provider  ACCU-CHEK SOFTCLIX LANCETS lancets Use for  once daily testing of blood sugar Patient not taking: Reported on 06/11/2017 06/02/17   Hoy Register, MD  acetaminophen-codeine (TYLENOL #3) 300-30 MG tablet TAKE 1 TABLET BY MOUTH AT BEDTIME AS NEEDED FOR MODERATE PAIN. 09/09/17   Hoy Register, MD  albuterol (PROVENTIL HFA;VENTOLIN HFA) 108 (90 Base) MCG/ACT inhaler Inhale 2 puffs into the lungs every 6 (six) hours as needed for wheezing or shortness of breath. 03/29/15    Hoy Register, MD  albuterol (PROVENTIL) (2.5 MG/3ML) 0.083% nebulizer solution Take 3 mLs (2.5 mg total) by nebulization every 6 (six) hours as needed for wheezing or shortness of breath. Patient not taking: Reported on 06/11/2017 04/18/15   Hoy Register, MD  allopurinol (ZYLOPRIM) 100 MG tablet Take 2 tablets (200 mg total) by mouth daily.(AM) 07/30/17   Hoy Register, MD  atorvastatin (LIPITOR) 40 MG tablet take 1 TABLET BY MOUTH EVERY EVENING at 6pm 07/30/17   Hoy Register, MD  Blood Glucose Monitoring Suppl (ACCU-CHEK AVIVA) device Use as instructed1 times daily before meals Patient not taking: Reported on 06/11/2017 06/02/17   Hoy Register, MD  diclofenac sodium (VOLTAREN) 1 % GEL APPLY 4 GRAMS TOPICALLY 4 (FOUR) TIMES DAILY 09/09/17   Hoy Register, MD  digoxin (LANOXIN) 0.125 MG tablet Take 0.5 tablets (0.0625 mg total) by mouth daily. 07/30/17   Hoy Register, MD  ergocalciferol (DRISDOL) 50000 units capsule Take 1 capsule (50,000 Units total) by mouth once a week. 06/03/17   Hoy Register, MD  gabapentin (NEURONTIN) 300 MG capsule Take 1 capsule (300 mg total) by mouth daily. 07/30/17   Hoy Register, MD  glipiZIDE (GLUCOTROL) 10 MG tablet Take 1 tablet (10 mg total) by mouth 2 (two) times daily before a meal. 07/30/17   Hoy Register, MD  glucose blood (ACCU-CHEK AVIVA) test strip Use as instructed for 1 times daily testing of blood sugar Patient not taking: Reported on 06/11/2017 06/02/17   Hoy Register, MD  hydrALAZINE (APRESOLINE) 25 MG tablet take 1 TABLET BY MOUTH THREE TIMES DAILY every morning, noon,evening 07/30/17   Hoy Register, MD  Insulin Glargine (LANTUS) 100 UNIT/ML Solostar Pen Inject 50 Units into the skin daily at 10 pm. 07/30/17   Hoy Register, MD  isosorbide mononitrate (IMDUR) 30 MG 24 hr tablet Take 1 tablet (30 mg total) by mouth daily. 07/30/17   Hoy Register, MD  [employer]) lancets Use as instructed for once daily testing of  blood sugar Patient not taking: Reported on 06/02/2017 11/28/15   Hoy Register, MD  meclizine (ANTIVERT) 25 MG tablet TAKE 1 TABLET (25 MG TOTAL) BY MOUTH 3 (THREE) TIMES DAILY AS NEEDED FOR DIZZINESS. 09/09/17   Hoy Register, MD  metolazone (ZAROXOLYN) 2.5 MG tablet Take 1 tablet once as directed by CHF clinic. 07/30/17   Hoy Register, MD  rivaroxaban (XARELTO) 20 MG TABS tablet take one 20mg  tablet daily by mouth 07/30/17   Hoy Register, MD  torsemide (DEMADEX) 20 MG tablet Take 4 tablets (80 mg total) by mouth 2 (two) times daily. 07/30/17   Hoy Register, MD  traZODone (DESYREL) 100 MG tablet Take 1 tablet (100 mg total) by mouth at bedtime as needed for sleep. Patient not taking: Reported on 06/11/2017 05/02/17   Hoy Register, MD    Physical Exam: Vitals:   09/12/17 1753 09/12/17 1815 09/12/17 1819 09/12/17 2000  BP:  118/65 118/65 101/73  Pulse:   (!) 108 (!) 101  Resp:   16 (!) 23  Temp:   98.2 F (36.8 C)  TempSrc:   Oral   SpO2:   98% 95%  Weight: 108.9 kg (240 lb)     Height: 5\' 7"  (1.702 m)         Constitutional: NAD, calm, comfortable Vitals:   09/12/17 1753 09/12/17 1815 09/12/17 1819 09/12/17 2000  BP:  118/65 118/65 101/73  Pulse:   (!) 108 (!) 101  Resp:   16 (!) 23  Temp:   98.2 F (36.8 C)   TempSrc:   Oral   SpO2:   98% 95%  Weight: 108.9 kg (240 lb)     Height: 5\' 7"  (1.702 m)      Eyes: PERRL, lids and conjunctivae normal ENMT: Mucous membranes are moist. Posterior pharynx clear of any exudate or lesions.Normal dentition.  Neck: normal, supple, no masses, no thyromegaly Respiratory: clear to auscultation bilaterally, no wheezing, no crackles. Normal respiratory effort. No accessory muscle use.  Cardiovascular: Regular rate and rhythm, no murmurs / rubs / gallops. No extremity edema. 2+ pedal pulses. No carotid bruits.  Abdomen: no tenderness, no masses palpated. No hepatosplenomegaly. Bowel sounds positive.  Musculoskeletal: no clubbing /  cyanosis. No joint deformity upper and lower extremities. Good ROM, no contractures. Normal muscle tone.  Skin: no rashes, lesions, ulcers. No induration Neurologic: CN 2-12 grossly intact. Sensation intact, DTR normal. Strength 5/5 in all 4.  Psychiatric: Normal judgment and insight. Alert and oriented x 3. Normal mood.    Labs on Admission: I have personally reviewed following labs and imaging studies  CBC: Recent Labs  Lab 09/12/17 1809  WBC 13.3*  HGB 7.4*  HCT 27.7*  MCV 70.1*  PLT 260   Basic Metabolic Panel: Recent Labs  Lab 09/12/17 1809  NA 136  K 2.7*  CL 97*  CO2 30  GLUCOSE 102*  BUN 20  CREATININE 1.17  CALCIUM 7.9*   GFR: Estimated Creatinine Clearance: 79 mL/min (by C-G formula based on SCr of 1.17 mg/dL). Liver Function Tests: No results for input(s): AST, ALT, ALKPHOS, BILITOT, PROT, ALBUMIN in the last 168 hours. No results for input(s): LIPASE, AMYLASE in the last 168 hours. No results for input(s): AMMONIA in the last 168 hours. Coagulation Profile: No results for input(s): INR, PROTIME in the last 168 hours. Cardiac Enzymes: No results for input(s): CKTOTAL, CKMB, CKMBINDEX, TROPONINI in the last 168 hours. BNP (last 3 results) No results for input(s): PROBNP in the last 8760 hours. HbA1C: No results for input(s): HGBA1C in the last 72 hours. CBG: No results for input(s): GLUCAP in the last 168 hours. Lipid Profile: No results for input(s): CHOL, HDL, LDLCALC, TRIG, CHOLHDL, LDLDIRECT in the last 72 hours. Thyroid Function Tests: No results for input(s): TSH, T4TOTAL, FREET4, T3FREE, THYROIDAB in the last 72 hours. Anemia Panel: No results for input(s): VITAMINB12, FOLATE, FERRITIN, TIBC, IRON, RETICCTPCT in the last 72 hours. Urine analysis:    Component Value Date/Time   COLORURINE YELLOW 12/01/2014 1300   APPEARANCEUR CLEAR 12/01/2014 1300   LABSPEC 1.009 12/01/2014 1300   PHURINE 5.5 12/01/2014 1300   GLUCOSEU NEGATIVE 12/01/2014  1300   HGBUR NEGATIVE 12/01/2014 1300   BILIRUBINUR NEGATIVE 12/01/2014 1300   KETONESUR NEGATIVE 12/01/2014 1300   PROTEINUR NEGATIVE 12/01/2014 1300   UROBILINOGEN 1.0 12/01/2014 1300   NITRITE NEGATIVE 12/01/2014 1300   LEUKOCYTESUR NEGATIVE 12/01/2014 1300   Sepsis Labs: !!!!!!!!!!!!!!!!!!!!!!!!!!!!!!!!!!!!!!!!!!!! @LABRCNTIP (procalcitonin:4,lacticidven:4) )No results found for this or any previous visit (from the past 240 hour(s)).   Radiological Exams on Admission: Dg Chest 2 View  Result Date: 09/12/2017 CLINICAL DATA:  Pt arrives via EMS from home with reports of central CP onset approx 1530 today. 7/10 with SOB. Pt with extensive cardiac hx. afib with frequent PVC's with EMS. Does have pacer, not in paced rhythm. Hx of HTN, diabetes, MI, CAD, CHF, AFIB. Current everyday smoker- 0.5 ppd. EXAM: CHEST - 2 VIEW COMPARISON:  06/11/2017 FINDINGS: There stable changes from prior CABG surgery. The cardiac silhouette is normal in size. No mediastinal or hilar masses. No convincing adenopathy. There is opacity at the right posterolateral lung base, which was present on the prior exam. There are prominent bronchovascular markings. Lungs otherwise clear. No pleural effusion.  No pneumothorax. Left anterior chest wall single lead pacemaker is stable. Skeletal structures are intact. IMPRESSION: 1. Opacity at the posterolateral aspect of the right lung base, which was present on the prior exam. This may represent pneumonia or atelectasis. This was not present on a high-resolution chest CT from 04/22/2017. 2. No other evidence of acute cardiopulmonary disease. Electronically Signed   By: Amie Portland M.D.   On: 09/12/2017 19:04    EKG: Independently reviewed.  A. fib with frequent PVCs Old chart reviewed Case assessment EDP Dr. Rodena Medin Chest x-ray reviewed no edema or infiltrate  Assessment/Plan 62 year old male with multiple risk factors comes in with atypical chest pain concerning for underlying  ACS  Principal Problem:   Chest pain-troponin negative with nonischemic EKG.  Potassium level is low and he is anemic.  His anemia may be contributing to his symptoms.  Serial cardiac enzymes.  Check a cardiac echo in the morning.  Transfuse.  Active Problems:   Essential hypertension-stable clarify home meds    CAD- s/p CABG July 2014 [facility]-aspirin    AF (atrial fibrillation) (HCC)-clarify home meds appears he is on Xarelto currently rate controlled    ICD - in place- BS May 2014 [place]-stable   anemia of chronic disease-no overt bleeding he denies any bleeding from anywhere.  Check anemia panel.  Transfuse 1 unit of blood as his hemoglobin is less than 8 at this time Hemoccult stools    Chronic anticoagulation-continue Xarelto    CHF (congestive heart failure) (HCC)-does not appear volume overloaded at this time.  Continue Lasix.    Med requisition by pharmacy is pending and incomplete   DVT prophylaxis: Xarelto Code Status: Full Family Communication: None Disposition Plan: Likely tomorrow Consults called: None Admission status:Observation    Melany Wiesman A MD [facility]  If 7PM-7AM, please contact night-coverage [email or web address] Password [facility]  09/12/2017, 9:17 PM

## 2017-09-12 NOTE — ED Notes (Signed)
Patient transported to X-ray 

## 2017-09-12 NOTE — ED Notes (Signed)
Pt request blood draw from IV,  I will notify nurse. 

## 2017-09-13 ENCOUNTER — Observation Stay (HOSPITAL_COMMUNITY): Payer: Medicare Other

## 2017-09-13 DIAGNOSIS — E876 Hypokalemia: Secondary | ICD-10-CM | POA: Diagnosis not present

## 2017-09-13 DIAGNOSIS — I5022 Chronic systolic (congestive) heart failure: Secondary | ICD-10-CM | POA: Diagnosis not present

## 2017-09-13 DIAGNOSIS — R079 Chest pain, unspecified: Secondary | ICD-10-CM | POA: Diagnosis not present

## 2017-09-13 DIAGNOSIS — I2510 Atherosclerotic heart disease of native coronary artery without angina pectoris: Secondary | ICD-10-CM

## 2017-09-13 DIAGNOSIS — I482 Chronic atrial fibrillation: Secondary | ICD-10-CM

## 2017-09-13 DIAGNOSIS — R0789 Other chest pain: Secondary | ICD-10-CM

## 2017-09-13 DIAGNOSIS — D638 Anemia in other chronic diseases classified elsewhere: Secondary | ICD-10-CM

## 2017-09-13 DIAGNOSIS — Z95810 Presence of automatic (implantable) cardiac defibrillator: Secondary | ICD-10-CM | POA: Diagnosis not present

## 2017-09-13 LAB — COMPREHENSIVE METABOLIC PANEL
ALBUMIN: 2.9 g/dL — AB (ref 3.5–5.0)
ALT: 18 U/L (ref 17–63)
AST: 24 U/L (ref 15–41)
Alkaline Phosphatase: 154 U/L — ABNORMAL HIGH (ref 38–126)
Anion gap: 8 (ref 5–15)
BILIRUBIN TOTAL: 0.6 mg/dL (ref 0.3–1.2)
BUN: 22 mg/dL — AB (ref 6–20)
CHLORIDE: 99 mmol/L — AB (ref 101–111)
CO2: 28 mmol/L (ref 22–32)
Calcium: 7.8 mg/dL — ABNORMAL LOW (ref 8.9–10.3)
Creatinine, Ser: 1.25 mg/dL — ABNORMAL HIGH (ref 0.61–1.24)
GFR calc Af Amer: >60 mL/min (ref >60)
GFR calc non Af Amer: >60 mL/min (ref >60)
GLUCOSE: 164 mg/dL — AB (ref 65–99)
POTASSIUM: 3 mmol/L — AB (ref 3.5–5.1)
Sodium: 135 mmol/L (ref 135–145)
Total Protein: 7.3 g/dL (ref 6.5–8.1)

## 2017-09-13 LAB — ECHOCARDIOGRAM COMPLETE
Height: 65 in
Weight: 3901.26 oz

## 2017-09-13 LAB — CBC
HCT: 31.1 % — ABNORMAL LOW (ref 39.0–52.0)
Hemoglobin: 8.5 g/dL — ABNORMAL LOW (ref 13.0–17.0)
MCH: 19.5 pg — ABNORMAL LOW (ref 26.0–34.0)
MCHC: 27.3 g/dL — ABNORMAL LOW (ref 30.0–36.0)
MCV: 71.2 fL — AB (ref 78.0–100.0)
PLATELETS: 277 10*3/uL (ref 150–400)
RBC: 4.37 MIL/uL (ref 4.22–5.81)
RDW: 23 % — ABNORMAL HIGH (ref 11.5–15.5)
WBC: 11.7 10*3/uL — ABNORMAL HIGH (ref 4.0–10.5)

## 2017-09-13 LAB — GLUCOSE, CAPILLARY: GLUCOSE-CAPILLARY: 140 mg/dL — AB (ref 65–99)

## 2017-09-13 LAB — FOLATE: Folate: 14.9 ng/mL (ref >5.9)

## 2017-09-13 LAB — TROPONIN I
TROPONIN I: 0.06 ng/mL — AB (ref <0.03)
Troponin I: 0.06 ng/mL (ref <0.03)

## 2017-09-13 MED ORDER — DIGOXIN 125 MCG PO TABS
0.1250 mg | ORAL_TABLET | Freq: Every day | ORAL | Status: DC
  Administered 2017-09-13: 0.125 mg via ORAL
  Filled 2017-09-13: qty 1

## 2017-09-13 MED ORDER — SODIUM CHLORIDE 0.9 % IV SOLN: INTRAVENOUS | Status: DC | PRN

## 2017-09-13 MED ORDER — FUROSEMIDE 10 MG/ML IJ SOLN
80.0000 mg | Freq: Two times a day (BID) | INTRAMUSCULAR | Status: DC
  Administered 2017-09-13: 80 mg via INTRAVENOUS
  Filled 2017-09-13: qty 8

## 2017-09-13 MED ORDER — DIPHENHYDRAMINE HCL 25 MG PO CAPS
25.0000 mg | ORAL_CAPSULE | Freq: Once | ORAL | Status: AC
  Administered 2017-09-13: 25 mg via ORAL
  Filled 2017-09-13: qty 1

## 2017-09-13 MED ORDER — DIGOXIN 125 MCG PO TABS: 0.0625 mg | ORAL_TABLET | Freq: Every day | ORAL | Status: DC

## 2017-09-13 MED ORDER — MAGNESIUM SULFATE 2 GM/50ML IV SOLN
2.0000 g | Freq: Once | INTRAVENOUS | Status: AC
  Administered 2017-09-13: 2 g via INTRAVENOUS
  Filled 2017-09-13: qty 50

## 2017-09-13 MED ORDER — HYDRALAZINE HCL 25 MG PO TABS: 25.0000 mg | ORAL_TABLET | Freq: Three times a day (TID) | ORAL | Status: DC

## 2017-09-13 MED ORDER — ATORVASTATIN CALCIUM 80 MG PO TABS: 80.0000 mg | ORAL_TABLET | Freq: Every day | ORAL | Status: DC

## 2017-09-13 MED ORDER — SPIRONOLACTONE 25 MG PO TABS: 25.0000 mg | ORAL_TABLET | Freq: Every day | ORAL | 0 refills | Status: DC

## 2017-09-13 MED ORDER — INSULIN GLARGINE 100 UNIT/ML ~~LOC~~ SOLN: 50.0000 [IU] | Freq: Every day | SUBCUTANEOUS | Status: DC

## 2017-09-13 MED ORDER — ALLOPURINOL 100 MG PO TABS
200.0000 mg | ORAL_TABLET | Freq: Every day | ORAL | Status: DC
  Administered 2017-09-13: 200 mg via ORAL
  Filled 2017-09-13: qty 2

## 2017-09-13 MED ORDER — PERFLUTREN LIPID MICROSPHERE
INTRAVENOUS | Status: AC
  Filled 2017-09-13: qty 10

## 2017-09-13 MED ORDER — SPIRONOLACTONE 25 MG PO TABS: 25.0000 mg | ORAL_TABLET | Freq: Every day | ORAL | Status: DC

## 2017-09-13 MED ORDER — PERFLUTREN LIPID MICROSPHERE
1.0000 mL | INTRAVENOUS | Status: AC | PRN
  Administered 2017-09-13: 2 mL via INTRAVENOUS
  Filled 2017-09-13: qty 10

## 2017-09-13 MED ORDER — POTASSIUM CHLORIDE CRYS ER 20 MEQ PO TBCR
20.0000 meq | EXTENDED_RELEASE_TABLET | Freq: Once | ORAL | Status: AC
  Administered 2017-09-13: 20 meq via ORAL
  Filled 2017-09-13: qty 1

## 2017-09-13 NOTE — Consult Note (Signed)
Cardiology Consultation:   Patient ID: Jacob [name]; [id number]; 07-Feb-1956   Admit date: 09/12/2017 Date of Consult: 09/13/2017  Primary Care Provider: Hoy Register, MD Primary Cardiologist:  Dr. Gala Romney CHF clinic (last seen by Maxine Glenn, PA on 05/28/2017, also multiple visits by para medicine program.  Patient Profile:   Jacob [name] is a 62 y.o. male with a hx of tonic systolic heart failure who is being seen today for the evaluation of chest pain, mildly elevated troponin at the request of Dr. Selena Batten.  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  He underwent cardiac catheterization on 04/23/2017 which demonstrated stable coronary artery disease.  This was performed during an admission in January 2019 for chest pain and heart failure.  He was diuresed.  Right heart catheterization at that time showed compensated filling pressures.    Past Medical History:  Diagnosis Date  . Atrial fibrillation (HCC)    RVR 10/2014  . Automatic implantable cardioverter-defibrillator in situ   . CAD (coronary artery disease) Sept 2013   s/p cardiac cath showing occlusion of small RCA with collaterals  . CHF (congestive heart failure) (HCC)    20 to 25 % EF and RV dysfunction by 07/2014 echo   . Chronic anticoagulation    on xarelto.   . High cholesterol   . Hypertension   . Myocardial infarction (HCC) 2014  . Noncompliance    homelessness  contributing.   . Peripheral arterial disease (HCC)   . Type II diabetes mellitus (HCC)     Past Surgical History:  Procedure Laterality Date  . CARDIAC CATHETERIZATION  09/2012  . CORONARY ANGIOPLASTY WITH STENT PLACEMENT  11/2011   "1"  . CORONARY ARTERY BYPASS GRAFT  09/2012   2 vessels per patient [name]   . ESOPHAGOGASTRODUODENOSCOPY N/A 11/28/2014   Procedure: ESOPHAGOGASTRODUODENOSCOPY (EGD);  Surgeon: Beverley Fiedler, MD;  Location: [facility] ENDOSCOPY;  Service: Endoscopy;  Laterality: N/A;  . ILIAC ARTERY STENT Right 08/30/2013  . IMPLANTABLE CARDIOVERTER DEFIBRILLATOR IMPLANT     [place] in 07/2012; [employer]  . LEFT AND RIGHT HEART CATHETERIZATION WITH CORONARY ANGIOGRAM N/A 12/02/2011   Procedure: LEFT AND RIGHT HEART CATHETERIZATION WITH CORONARY ANGIOGRAM;  Surgeon: Kathleene Hazel, MD;  Location: [facility] CATH LAB;  Service: Cardiovascular;  Laterality: N/A;  . LOWER EXTREMITY ANGIOGRAM N/A 08/30/2013   Procedure: LOWER EXTREMITY ANGIOGRAM;  Surgeon: Runell Gess, MD;  Location: [facility] CATH LAB;  Service: Cardiovascular;  Laterality: N/A;  . LOWER EXTREMITY ANGIOGRAM N/A 12/02/2013   Procedure: LOWER EXTREMITY ANGIOGRAM;  Surgeon: Runell Gess, MD;  Location: [facility] CATH LAB;  Service: Cardiovascular;  Laterality: N/A;  . RIGHT/LEFT HEART CATH AND CORONARY/GRAFT ANGIOGRAPHY N/A 04/23/2017   Procedure: RIGHT/LEFT HEART CATH AND CORONARY/GRAFT ANGIOGRAPHY;  Surgeon: Dolores Patty, MD;  Location: [facility] INVASIVE CV LAB;  Service: Cardiovascular;  Laterality: N/A;       Inpatient Medications: Scheduled Meds: . sodium chloride   Intravenous Once  . allopurinol  200 mg  Oral Daily  . atorvastatin  80 mg Oral q1800  . digoxin  0.125 mg Oral Daily  . furosemide  80 mg Intravenous BID  . insulin aspart  0-9 Units Subcutaneous TID WC  . insulin glargine  50 Units Subcutaneous QHS  . isosorbide mononitrate  30 mg Oral Daily   Continuous Infusions:  PRN Meds: acetaminophen,  albuterol, morphine injection, nitroGLYCERIN, ondansetron (ZOFRAN) IV  Allergies:    Allergies  Allergen Reactions  . Pork-Derived Products Other (See Comments)    Religious preference    Social History: Lives in low income housing, estranged from family.  Social History   Socioeconomic History  . Marital status: Divorced    Spouse name: Not on file  . Number of children: 3  . Years of education: Not on file  . Highest education level: Not on file  Occupational History  . Occupation: disabled  Social Needs  . Financial resource strain: Not on file  . Food insecurity:    Worry: Not on file    Inability: Not on file  . Transportation needs:    Medical: Not on file    Non-medical: Not on file  Tobacco Use  . Smoking status: Current Every Day Smoker    Packs/day: 0.50    Years: 40.00    Pack years: 20.00    Types: Cigarettes  . Smokeless tobacco: Never Used  Substance and Sexual Activity  . Alcohol use: No    Alcohol/week: 0.0 oz  . Drug use: No  . Sexual activity: Never  Lifestyle  . Physical activity:    Days per week: Not on file    Minutes per session: Not on file  . Stress: Not on file  Relationships  . Social connections:    Talks on phone: Not on file    Gets together: Not on file    Attends religious service: Not on file    Active member of club or organization: Not on file    Attends meetings of clubs or organizations: Not on file    Relationship status: Not on file  . Intimate partner violence:    Fear of current or ex partner: Not on file    Emotionally abused: Not on file    Physically abused: Not on file    Forced sexual activity: Not on file  Other Topics Concern  . Not on file  Social History Narrative   Has an apartment with a roommate. He was living on the streets in 2013/01/22.  He reports that his father died in [place] in 2013/01/22.  He is divorced.  He is no longer estranged from his son, but still from his daughter who lives locally.   Neither of his parents, nor any siblings have any history of CAD.    Family History:    Family History  Problem Relation Age of Onset  . Diabetes Mother      ROS:  Please see the history of present illness.  Denies any fevers chills nausea vomiting syncope bleeding All other ROS reviewed and negative.     Physical Exam/Data:   Vitals:   09/13/17 0200 09/13/17 0232 09/13/17 0434 09/13/17 0509  BP: 115/63 126/70 133/73 129/65  Pulse: (!) 103 (!) 108 (!) 107 100  Resp: (!) 29 18 (!) 25 16  Temp: 98 F (36.7 C) 98.2 F (36.8 C) 98.1 F (36.7 C) 98.6 F (37 C)  TempSrc: Oral Oral Oral Oral  SpO2: 94% 95% 93%   Weight:  Height:        Intake/Output Summary (Last 24 hours) at 09/13/2017 1020 Last data filed at 09/13/2017 0509 Gross per 24 hour  Intake 846.08 ml  Output -  Net 846.08 ml   Filed Weights   09/12/17 1753 09/12/17 2322  Weight: 240 lb (108.9 kg) 243 lb 13.3 oz (110.6 kg)   Body mass index is 40.58 kg/m.  General:  Well nourished, well developed, in no acute distress, overweight, sitting up in bed, eating breakfast HEENT: normal Lymph: no adenopathy Neck:  JVD 7 cm Endocrine:  No thryomegaly Vascular: No carotid bruits; diminished distal pedal pulses Cardiac:  normal S1, S2; IRRR; no murmurs Lungs:  clear to auscultation bilaterally, no wheezing, rhonchi or rales  Abd: soft, nontender, no hepatomegaly  Ext: no edema Musculoskeletal:  No deformities, BUE and BLE strength normal and equal Skin: warm and dry  Neuro:  CNs 2-12 intact, no focal abnormalities noted Psych:  Normal affect   EKG:  The EKG was personally reviewed and demonstrates: AFIB 99bpm frequent PVCs, left anterior fascicular block, poor R wave progression.  Telemetry:  Telemetry was personally reviewed and demonstrates: Atrial fibrillation with heart rates from 90-110  Relevant CV Studies:   [facility] 04/23/17  Ost RCA to Prox RCA lesion is 100% stenosed.  Ost Cx to Prox Cx lesion  is 99% stenosed.  Ost LAD to Prox LAD lesion is 40% stenosed.  Prox LAD to Mid LAD lesion is 40% stenosed.  Dist LAD lesion is 70% stenosed.  Findings:  Ao = 126/66 (86)  LV = 118/14 RA = 8 RV = 44/8 PA = 52/17 (34) PCW = 17 Fick cardiac output/index = 4.2/2.0 PVR = 4.0 WU Ao sat = 98% PA sat = 60%, 61%  Assessment: 1. 3v CAD with stable revascularization with patent SVG to LPDA and SVG to Ramus 2. LAD with non-obstructive disease 3. Filling pressures normal 4. Moderately depressed cardiac output   Laboratory Data:  Chemistry Recent Labs  Lab 09/12/17 1809 09/13/17 0653  NA 136 135  K 2.7* 3.0*  CL 97* 99*  CO2 30 28  GLUCOSE 102* 164*  BUN 20 22*  CREATININE 1.17 1.25*  CALCIUM 7.9* 7.8*  GFRNONAA >60 >60  GFRAA >60 >60  ANIONGAP 9 8    Recent Labs  Lab 09/13/17 0653  PROT 7.3  ALBUMIN 2.9*  AST 24  ALT 18  ALKPHOS 154*  BILITOT 0.6   Hematology Recent Labs  Lab 09/12/17 1809 09/12/17 2217 09/13/17 0653  WBC 13.3*  --  11.7*  RBC 3.95* 3.74* 4.37  HGB 7.4*  --  8.5*  HCT 27.7*  --  31.1*  MCV 70.1*  --  71.2*  MCH 18.7*  --  19.5*  MCHC 26.7*  --  27.3*  RDW 22.1*  --  23.0*  PLT 260  --  277   Cardiac Enzymes Recent Labs  Lab 09/12/17 2217 09/13/17 0010 09/13/17 0653  TROPONINI 0.06* 0.06* 0.06*    Recent Labs  Lab 09/12/17 1811  TROPIPOC 0.02    BNPNo results for input(s): BNP, PROBNP in the last 168 hours.  DDimer No results for input(s): DDIMER in the last 168 hours.  Radiology/Studies:  Dg Chest 2 View  Result Date: 09/12/2017 CLINICAL DATA:  Pt arrives via EMS from home with reports of central CP onset approx 1530 today. 7/10 with SOB. Pt with extensive cardiac hx. afib with frequent PVC's with EMS. Does have pacer, not in paced rhythm. Hx  of HTN, diabetes, MI, CAD, CHF, AFIB. Current everyday smoker- 0.5 ppd. EXAM: CHEST - 2 VIEW COMPARISON:  06/11/2017 FINDINGS: There stable changes from prior CABG surgery. The  cardiac silhouette is normal in size. No mediastinal or hilar masses. No convincing adenopathy. There is opacity at the right posterolateral lung base, which was present on the prior exam. There are prominent bronchovascular markings. Lungs otherwise clear. No pleural effusion.  No pneumothorax. Left anterior chest wall single lead pacemaker is stable. Skeletal structures are intact. IMPRESSION: 1. Opacity at the posterolateral aspect of the right lung base, which was present on the prior exam. This may represent pneumonia or atelectasis. This was not present on a high-resolution chest CT from 04/22/2017. 2. No other evidence of acute cardiopulmonary disease. Electronically Signed   By: Amie Portland M.D.   On: 09/12/2017 19:04    Assessment and Plan:   Chest pain with underlying CAD status post CABG- mildly elevated flat troponin - Reassuring most recent cardiac catheterization showing patent/stable coronary artery disease post bypass.  Troponins are low level and flat.  They do not have a pattern of acute coronary syndrome.  They are low level and flat secondary to underlying systolic heart failure, demand ischemia.  I would not pursue any further invasive work-up at this time.  He has had atypical discomfort in the past.  Certainly this can be secondary to his underlying cardiomyopathy.  Acute on chronic systolic heart failure - EF 30 to 35% on most recent echocardiogram, ICD in place - Right and left heart catheterization January 2019 showing stable CAD, compensated filling pressures at that time.  He has ongoing stable NYHA class III symptoms with orthopnea.  Has had prior history of difficulty complying with fluid restrictions as well as salt restrictions. -As outpatient has been on torsemide 80 mg twice a day as well as metolazone 2.5 mg occasionally once a week.  He has also been on spironolactone 25 mg a day (I will add back) as well as low-dose digoxin 0.0625 mg a day, currently 125 mcg.  I will  change back.  Hydralazine 25 mg 3 times a day is also being utilized for afterload reduction. -Currently getting Lasix 80 mg IV twice daily.  His home torsemide and metolazone are currently on hold.  Continue to monitor input and output. -Prior discharge weight on 04/24/2017 was 247 pounds, currently 243. -He has not been on beta-blocker.  Perhaps he does not tolerate with low cardiac output.  In outpatient follow-up, consider low-dose.  Permanent atrial fibrillation - Slightly elevated rate, commonly seen with him in the setting of heart failure exacerbation.  Off of beta-blocker at this time.  Off of amiodarone as well.  Continue with digoxin.  History of GI bleeding - In 2005, duodenal ulcer, clipping.  No recent issue.  Denies melena.  Hemoglobin was low on arrival however currently 8.5.  Peripheral arterial disease - Has a right sided external iliac artery stent with known bilateral SFA occlusion.  Not a candidate for further therapies.  Tobacco use - Continues to smoke.  Encourage cessation.  Essential hypertension -Stable  Hypokalemia - Spironolactone I will add back, replete, needs basic metabolic profile in 1 week.  He has had in the past issues with hyperkalemia.  We will set up close heart failure clinic follow-up.  He is demanding that he leaves today.  I offered him 1 more day of diuresis then reevaluation in the morning and he expressed that he will leave today.  I have discussed with Dr. Selena Batten.  We will go ahead and try to establish revisit with the heart failure clinic in the next week.  Continue with para medicine program.     For questions or updates, please contact [facility] Please consult [email or web address] for contact info under Cardiology/STEMI.   Signed, Donato Schultz, MD  09/13/2017 10:20 AM

## 2017-09-13 NOTE — Progress Notes (Addendum)
Patient ID: Jacob [name], male   DOB: 03/27/1955, 62 y.o.   MRN: [id number]                                                                PROGRESS NOTE                                                                                                                                                                                                             Patient Demographics:    Jacob [name], is a 62 y.o. male, DOB - 12/10/1955, YOM:[id number]  Admit date - 09/12/2017   Admitting Physician Haydee Monica, MD  Outpatient Primary MD for the patient is Hoy Register, MD  LOS - 0  Outpatient Specialists:    Chief Complaint  Patient presents with  . Chest Pain       Brief Narrative   62 y.o. male with medical history significant of congestive heart failure with EF of 20%, AICD, A. fib on Xarelto, hypertension, peripheral vascular disease comes in with substernal chest pain that happened earlier that lasted over an hour that did not radiate anywhere.  He did not have any associated shortness of breath or nausea vomiting with it.  He is never had chest pain before in the past.  He took a nitroglycerin that he had at home which resolved his symptoms.  Patient denies any lower extremity edema or swelling.  He denies any fevers, cough, PND, orthopnea.  Patient is being referred for admission for chest pain work-up.      Subjective:    [name] today has slight dyspnea this am.  No chest pain this am.  Pt notes slight swelling in his legs.  Unable to discern if has gained weight.     No headache, No abdominal pain - No Nausea, No new weakness tingling or numbness, No Cough - No fever   Assessment  & Plan :    Principal Problem:   Chest pain Active Problems:   Essential hypertension   CAD- s/p CABG July 2014 [facility]   AF (atrial fibrillation) [facility])   ICD - in place- BS May 2014 [place]   Anemia of chronic disease   Chronic anticoagulation   CHF (congestive heart failure)  (HCC)   Chest pain, CAD s/p CABG  2014  Cycle troponin Cont imdur 30mg  po qday Cont lipitor 40mg =>80mg   po qhs Check lipid in am Unclear why not on beta blocker ? Due to low EF, defer to cardiology Cardiology consult  Troponin elevation ? Demand ischemia due to anemia Cardiac echo pending Cardiology consult for evaluation.   Pafib Cont xarelto Cont digoxin  Dyspnea  ? CHF (EF 20%) w hx of ICD Hold torsemide Hold metolazone Lasix 80mg  iv bid  Hypokalemia Check cmp this am  Hypomagnesemia Replete Check magnesium tomorrow am  Anemia s/p transfusion 1 unit prbc 6/21 Cbc in am this am  Dm2 Cont lantus 50 unit [place] qday Hold glipizide Cont gabapentin fsbs ac and qhs, ISS  Gout Cont allopurinol 200mg  po qday    Code Status :  FULL CODE  Family Communication  : w patient  Disposition Plan  : home  Barriers For Discharge :   Consults  :  cardiology  Procedures  : none  DVT Prophylaxis  :  Lovenox- SCDs   Lab Results  Component Value Date   PLT 260 09/12/2017    Antibiotics  :  none  Anti-infectives (From admission, onward)   None        Objective:   Vitals:   09/13/17 0200 09/13/17 0232 09/13/17 0434 09/13/17 0509  BP: 115/63 126/70 133/73 129/65  Pulse: (!) 103 (!) 108 (!) 107 100  Resp: (!) 29 18 (!) 25 16  Temp: 98 F (36.7 C) 98.2 F (36.8 C) 98.1 F (36.7 C) 98.6 F (37 C)  TempSrc: Oral Oral Oral Oral  SpO2: 94% 95% 93%   Weight:      Height:        Wt Readings from Last 3 Encounters:  09/12/17 110.6 kg (243 lb 13.3 oz)  09/02/17 107 kg (236 lb)  08/06/17 113.8 kg (250 lb 12.8 oz)     Intake/Output Summary (Last 24 hours) at 09/13/2017 0703 Last data filed at 09/13/2017 0509 Gross per 24 hour  Intake 846.08 ml  Output -  Net 846.08 ml     Physical Exam  Awake Alert, Oriented X 3, No new F.N deficits, Normal affect [place].AT,PERRAL Supple Neck, slight JVD, No cervical lymphadenopathy appriciated.  Symmetrical Chest  wall movement, Good air movement bilaterally, slight crackle right lung base, no wheezing Tachy s1, s2,  +ve B.Sounds, Abd Soft, No tenderness, No organomegaly appriciated, No rebound - guarding or rigidity. No Cyanosis, Clubbing or edema, No new Rash or bruise   + varicose veins    Data Review:    CBC Recent Labs  Lab 09/12/17 1809  WBC 13.3*  HGB 7.4*  HCT 27.7*  PLT 260  MCV 70.1*  MCH 18.7*  MCHC 26.7*  RDW 22.1*    Chemistries  Recent Labs  Lab 09/12/17 1809 09/12/17 2217  NA 136  --   K 2.7*  --   CL 97*  --   CO2 30  --   GLUCOSE 102*  --   BUN 20  --   CREATININE 1.17  --   CALCIUM 7.9*  --   MG  --  1.5*   ------------------------------------------------------------------------------------------------------------------ No results for input(s): CHOL, HDL, LDLCALC, TRIG, CHOLHDL, LDLDIRECT in the last 72 hours.  Lab Results  Component Value Date   HGBA1C 10.8 07/30/2017   ------------------------------------------------------------------------------------------------------------------ No results for input(s): TSH, T4TOTAL, T3FREE, THYROIDAB in the last 72 hours.  Invalid input(s): FREET3 ------------------------------------------------------------------------------------------------------------------ Recent Labs    09/12/17 2217  VITAMINB12 547  FOLATE  14.9  FERRITIN 14*  TIBC 328  IRON 18*  RETICCTPCT 2.4    Coagulation profile No results for input(s): INR, PROTIME in the last 168 hours.  No results for input(s): DDIMER in the last 72 hours.  Cardiac Enzymes Recent Labs  Lab 09/12/17 2217 09/13/17 0010  TROPONINI 0.06* 0.06*   ------------------------------------------------------------------------------------------------------------------    Component Value Date/Time   BNP 225.4 (H) 06/11/2017 1358   BNP 1,109.2 (H) 01/16/2016 1505    Inpatient Medications  Scheduled Meds: . sodium chloride   Intravenous Once  . insulin  aspart  0-9 Units Subcutaneous TID WC  . isosorbide mononitrate  30 mg Oral Daily  . torsemide  80 mg Oral BID   Continuous Infusions: PRN Meds:.acetaminophen, albuterol, morphine injection, nitroGLYCERIN, ondansetron (ZOFRAN) IV  Micro Results No results found for this or any previous visit (from the past 240 hour(s)).  Radiology Reports Dg Chest 2 View  Result Date: 09/12/2017 CLINICAL DATA:  Pt arrives via EMS from home with reports of central CP onset approx 1530 today. 7/10 with SOB. Pt with extensive cardiac hx. afib with frequent PVC's with EMS. Does have pacer, not in paced rhythm. Hx of HTN, diabetes, MI, CAD, CHF, AFIB. Current everyday smoker- 0.5 ppd. EXAM: CHEST - 2 VIEW COMPARISON:  06/11/2017 FINDINGS: There stable changes from prior CABG surgery. The cardiac silhouette is normal in size. No mediastinal or hilar masses. No convincing adenopathy. There is opacity at the right posterolateral lung base, which was present on the prior exam. There are prominent bronchovascular markings. Lungs otherwise clear. No pleural effusion.  No pneumothorax. Left anterior chest wall single lead pacemaker is stable. Skeletal structures are intact. IMPRESSION: 1. Opacity at the posterolateral aspect of the right lung base, which was present on the prior exam. This may represent pneumonia or atelectasis. This was not present on a high-resolution chest CT from 04/22/2017. 2. No other evidence of acute cardiopulmonary disease. Electronically Signed   By: Amie Portland M.D.   On: 09/12/2017 19:04    Time Spent in minutes  30   Pearson Grippe M.D on 09/13/2017 at 7:03 AM  Between 7am to 7pm - Pager - [phone]    After 7pm go to [email or web address] - password [facility]  [facility] -  Office  [phone]

## 2017-09-13 NOTE — Discharge Summary (Addendum)
Jacob [name], is a 62 y.o. male  DOB 07-27-1955  MRN [id number].  Admission date:  09/12/2017  Admitting Physician  Haydee Monica, MD  Discharge Date:  09/13/2017   Primary MD  Hoy Register, MD  Recommendations for primary care physician for things to follow:     Chest pain, CAD s/p CABG 2014  Cont imdur 30mg  po qday Cont lipitor 40mg  po qhs Started spironolactone 25mg  po qday Cont Torsemide 80mg  po bid Cont Metolazone as per home dosing Please f/u with cardiology in 1 week Check cbc, cmp in 1 week w pcp or cardiology since starting above  Troponin elevation ? Demand ischemia due to anemia Cardiac echo declined by patient Pt requesting discharge  Pafib Cont xarelto Cont digoxin  Dyspnea  ? CHF (EF 20%) w hx of ICD Cont Toresemide  Cont metolazone  Hypokalemia repleted Check cmp in 1 week  w pcp or cardiology  Hypomagnesemia Replete Check magnesium in 1 week w pcp/cardiology  Anemia s/p transfusion 1 unit prbc 6/21 Cbc in 1 week  Dm2 Cont lantus 50 unit [place] qday Cont glipizide Cont gabapentin   Gout Cont allopurinol 200mg  po qday       Admission Diagnosis  Hypokalemia [E87.6] Chest pain, unspecified type [R07.9]   Discharge Diagnosis  Hypokalemia [E87.6] Chest pain, unspecified type [R07.9]     Principal Problem:   Chest pain Active Problems:   Essential hypertension   CAD- s/p CABG July 2014 [facility]   AF (atrial fibrillation) [facility])   ICD - in place- BS May 2014 [place]   Anemia of chronic disease   Chronic anticoagulation   CHF (congestive heart failure) (HCC)      Past Medical History:  Diagnosis Date  . Atrial fibrillation (HCC)    RVR 10/2014  . Automatic implantable cardioverter-defibrillator in situ   . CAD (coronary artery disease) Sept 2013   s/p cardiac cath showing occlusion of small RCA with collaterals  . CHF  (congestive heart failure) (HCC)    20 to 25 % EF and RV dysfunction by 07/2014 echo   . Chronic anticoagulation    on xarelto.   . High cholesterol   . Hypertension   . Myocardial infarction (HCC) 2014  . Noncompliance    homelessness contributing.   . Peripheral arterial disease (HCC)   . Type II diabetes mellitus (HCC)     Past Surgical History:  Procedure Laterality Date  . CARDIAC CATHETERIZATION  09/2012  . CORONARY ANGIOPLASTY WITH STENT PLACEMENT  11/2011   "1"  . CORONARY ARTERY BYPASS GRAFT  09/2012   2 vessels per patient [name]   . ESOPHAGOGASTRODUODENOSCOPY N/A 11/28/2014   Procedure: ESOPHAGOGASTRODUODENOSCOPY (EGD);  Surgeon: Beverley Fiedler, MD;  Location: [facility] ENDOSCOPY;  Service: Endoscopy;  Laterality: N/A;  . ILIAC ARTERY STENT Right 08/30/2013  . IMPLANTABLE CARDIOVERTER DEFIBRILLATOR IMPLANT     [place] in 07/2012; [employer]  . LEFT AND RIGHT HEART CATHETERIZATION WITH CORONARY ANGIOGRAM N/A 12/02/2011   Procedure:  LEFT AND RIGHT HEART CATHETERIZATION WITH CORONARY ANGIOGRAM;  Surgeon: Kathleene Hazel, MD;  Location: [facility] CATH LAB;  Service: Cardiovascular;  Laterality: N/A;  . LOWER EXTREMITY ANGIOGRAM N/A 08/30/2013   Procedure: LOWER EXTREMITY ANGIOGRAM;  Surgeon: Runell Gess, MD;  Location[facility] CATH LAB;  Service: Cardiovascular;  Laterality: N/A;  . LOWER EXTREMITY ANGIOGRAM N/A 12/02/2013   Procedure: LOWER EXTREMITY ANGIOGRAM;  Surgeon: Runell Gess, MD;  Location: [facility] CATH LAB;  Service: Cardiovascular;  Laterality: N/A;  . RIGHT/LEFT HEART CATH AND CORONARY/GRAFT ANGIOGRAPHY N/A 04/23/2017   Procedure: RIGHT/LEFT HEART CATH AND CORONARY/GRAFT ANGIOGRAPHY;  Surgeon: Dolores Patty, MD;  Location: [facility] INVASIVE CV LAB;  Service: Cardiovascular;  Laterality: N/A;       HPI  from the history and physical done on the day of admission:      61 y.o.malewith medical history significant ofcongestive heart failure with EF of 20%, AICD, A. fib on  Xarelto, hypertension, peripheral vascular disease comes in with substernal chest pain that happened earlier that lasted over an hour that did not radiate anywhere. He did not have any associated shortness of breath or nausea vomiting with it. He is never had chest pain before in the past. He took a nitroglycerin that he had at home which resolved his symptoms. Patient denies any lower extremity edema or swelling. He denies any fevers, cough, PND, orthopnea. Patient is being referred for admission for chest pain work-up.       Hospital Course:     Pt was admitted and c/o dyspnea this am.  tx with lasix 80mg  iv x1 and felt improved. He apparently has social issues, and requested that he be discharged today. "threatening to leave AMA"   Pt d/w cardiology and they are ok with his discharge today.  Pt has been started on spironolactone for improved diuresis.  Pt not on beta blocker because I believe of low ER and unable to tolerate in the past.  Pt will be discharged to home.     Follow UP  Follow-up Information    Hoy Register, MD Follow up in 1 week(s).   Specialty:  Family Medicine Contact information: [address] [place] [address] [phone]        Bensimhon, Bevelyn Buckles, MD Follow up in 1 week(s).   Specialty:  Cardiology Contact information: [address] [place] [address] [phone]            Consults obtained - cardiology  Discharge Condition: stable  Diet and Activity recommendation: See Discharge Instructions below  Discharge Instructions      Discharge Medications     Allergies as of 09/13/2017      Reactions   Pork-derived Products Other (See Comments)   Religious preference      Medication List    TAKE these medications   ACCU-CHEK AVIVA device Use as instructed1 times daily before meals   accu-chek softclix lancets Use as instructed for once daily testing of blood sugar   ACCU-CHEK SOFTCLIX  LANCETS lancets Use for once daily testing of blood sugar   acetaminophen-codeine 300-30 MG tablet Commonly known as:  TYLENOL #3 TAKE 1 TABLET BY MOUTH AT BEDTIME AS NEEDED FOR MODERATE PAIN.   albuterol 108 (90 Base) MCG/ACT inhaler Commonly known as:  PROVENTIL HFA;VENTOLIN HFA Inhale 2 puffs into the lungs every 6 (six) hours as needed for wheezing or shortness of breath.   albuterol (2.5 MG/3ML) 0.083% nebulizer solution Commonly known as:  PROVENTIL Take 3 mLs (2.5 mg total) by nebulization every 6 (six) hours as needed for wheezing or shortness of breath.   allopurinol 100 MG tablet Commonly known as:  ZYLOPRIM Take 2 tablets (200 mg total) by mouth daily.(AM)   atorvastatin 40 MG tablet Commonly known as:  LIPITOR take 1 TABLET BY MOUTH EVERY EVENING at 6pm   diclofenac sodium 1 % Gel Commonly known as:  VOLTAREN APPLY 4 GRAMS TOPICALLY 4 (FOUR) TIMES DAILY   digoxin 0.125 MG tablet Commonly known as:  LANOXIN Take 0.5 tablets (0.0625 mg total) by mouth daily.   ergocalciferol 50000 units capsule Commonly known as:  DRISDOL Take 1 capsule (50,000 Units total) by mouth once a week.   gabapentin 300 MG capsule Commonly known as:  NEURONTIN Take 1 capsule (300 mg total) by mouth daily.   glipiZIDE 10 MG tablet Commonly known as:  GLUCOTROL Take 1 tablet (10 mg total) by mouth 2 (two) times daily before a meal.   glucose blood test strip Commonly known as:  ACCU-CHEK AVIVA Use as instructed for 1 times daily testing of blood sugar   hydrALAZINE 25 MG tablet Commonly known as:  APRESOLINE take 1 TABLET BY MOUTH THREE TIMES DAILY every morning, noon,evening   Insulin Glargine 100 UNIT/ML Solostar Pen Commonly known as:  LANTUS Inject 50 Units into the skin daily at 10 pm.   isosorbide mononitrate 30 MG 24 hr tablet Commonly known as:  IMDUR Take 1 tablet (30 mg total) by mouth daily.   meclizine 25 MG tablet Commonly known as:  ANTIVERT TAKE 1 TABLET (25  MG TOTAL) BY MOUTH 3 (THREE) TIMES DAILY AS NEEDED FOR DIZZINESS.   metolazone 2.5 MG tablet Commonly known as:  ZAROXOLYN Take 1 tablet once as directed by CHF clinic.   rivaroxaban 20 MG Tabs tablet Commonly known as:  XARELTO take one 20mg  tablet daily by mouth   spironolactone 25 MG tablet Commonly known as:  ALDACTONE Take 1 tablet (25 mg total) by mouth daily.   torsemide 20 MG tablet Commonly known as:  DEMADEX Take 4 tablets (80 mg total) by mouth 2 (two) times daily.   traZODone 100 MG tablet Commonly known as:  DESYREL Take 1 tablet (100 mg total) by mouth at bedtime as needed for sleep.       Major procedures and Radiology Reports - PLEASE review detailed and final reports for all details, in brief -    Dg Chest 2 View  Result Date: 09/12/2017 CLINICAL DATA:  Pt arrives via EMS from home with reports of central CP onset approx 1530 today. 7/10 with SOB. Pt with extensive cardiac hx. afib with frequent PVC's with EMS. Does have pacer, not in paced rhythm. Hx of HTN, diabetes, MI, CAD, CHF, AFIB. Current everyday smoker- 0.5 ppd. EXAM: CHEST - 2 VIEW COMPARISON:  06/11/2017 FINDINGS: There stable changes from prior CABG surgery. The cardiac silhouette is normal in size. No mediastinal or hilar masses. No convincing adenopathy. There is opacity at the right posterolateral lung base, which was present on the prior exam. There are prominent bronchovascular markings. Lungs otherwise clear. No pleural effusion.  No pneumothorax. Left anterior chest wall single lead pacemaker is stable. Skeletal structures are intact. IMPRESSION: 1. Opacity at the posterolateral aspect of the right lung base, which was present on the prior exam. This may represent pneumonia or atelectasis. This was not present on a high-resolution chest CT from 04/22/2017. 2. No other evidence of acute cardiopulmonary disease.  Electronically Signed   By: Amie Portland M.D.   On: 09/12/2017 19:04    Micro Results       No results found for this or any previous visit (from the past 240 hour(s)).     Today   Subjective    Jacob [name] today has no further chest pain, or dyspnea after lasix.    no headache,no abdominal pain,no new weakness tingling or numbness, feels much better wants to go home today.    Objective   Blood pressure 129/65, pulse 100, temperature 98.6 F (37 C), temperature source Oral, resp. rate 16, height 5\' 5"  (1.651 m), weight 110.6 kg (243 lb 13.3 oz), SpO2 93 %.   Intake/Output Summary (Last 24 hours) at 09/13/2017 1124 Last data filed at 09/13/2017 0509 Gross per 24 hour  Intake 846.08 ml  Output -  Net 846.08 ml    Exam Awake Alert, Oriented x 3, No new F.N deficits, Normal affect [place].AT,PERRAL Supple Neck,No JVD, No cervical lymphadenopathy appriciated.  Symmetrical Chest wall movement, Good air movement bilaterally, faint crackle right lung base, no wheezing Borderline tachy s1, s2,   +ve B.Sounds, Abd Soft, Non tender, No organomegaly appriciated, No rebound -guarding or rigidity. No Cyanosis, Clubbing or edema, No new Rash or bruise Gait instabilty   Data Review   CBC w Diff:  Lab Results  Component Value Date   WBC 11.7 (H) 09/13/2017   HGB 8.5 (L) 09/13/2017   HGB 11.2 (L) 06/02/2017   HCT 31.1 (L) 09/13/2017   HCT 36.5 (L) 06/02/2017   PLT 277 09/13/2017   PLT 265 06/02/2017   LYMPHOPCT 14 03/02/2017   MONOPCT 7 03/02/2017   EOSPCT 2 03/02/2017   BASOPCT 0 03/02/2017    CMP:  Lab Results  Component Value Date   NA 135 09/13/2017   NA 137 06/02/2017   K 3.0 (L) 09/13/2017   CL 99 (L) 09/13/2017   CO2 28 09/13/2017   BUN 22 (H) 09/13/2017   BUN 20 06/02/2017   CREATININE 1.25 (H) 09/13/2017   CREATININE 1.02 01/16/2016   PROT 7.3 09/13/2017   PROT 7.8 06/02/2017   ALBUMIN 2.9 (L) 09/13/2017   ALBUMIN 3.8 06/02/2017   BILITOT 0.6 09/13/2017   BILITOT 0.5 06/02/2017   ALKPHOS 154 (H) 09/13/2017   AST 24 09/13/2017   ALT 18  09/13/2017  .   Total Time in preparing paper work, data evaluation and todays exam - 35 minutes  Pearson Grippe M.D on 09/13/2017 at 11:24 AM  [facility]   Office  [phone]

## 2017-09-13 NOTE — Progress Notes (Signed)
  Echocardiogram 2D Echocardiogram has been performed.  Jacob [name] 09/13/2017, 10:44 AM

## 2017-09-14 LAB — TYPE AND SCREEN
ABO/RH(D): O POS
ANTIBODY SCREEN: NEGATIVE
Unit division: 0

## 2017-09-14 LAB — BPAM RBC
Blood Product Expiration Date: 201907202359
ISSUE DATE / TIME: 201906220206
Unit Type and Rh: 5100

## 2017-09-16 ENCOUNTER — Telehealth (HOSPITAL_COMMUNITY): Payer: Self-pay | Admitting: Internal Medicine

## 2017-09-16 NOTE — Telephone Encounter (Signed)
Patient called back to confirm appt on 09/30/17 @10  am.

## 2017-09-16 NOTE — Telephone Encounter (Signed)
Called and left message for patient to call back to confirm appt on 09/30/17 @10  am for HFU appt, per staff message from [facility], RN.

## 2017-09-17 ENCOUNTER — Other Ambulatory Visit (HOSPITAL_COMMUNITY): Payer: Self-pay

## 2017-09-17 DIAGNOSIS — E785 Hyperlipidemia, unspecified: Secondary | ICD-10-CM | POA: Diagnosis not present

## 2017-09-17 DIAGNOSIS — Z7951 Long term (current) use of inhaled steroids: Secondary | ICD-10-CM | POA: Diagnosis not present

## 2017-09-17 DIAGNOSIS — Z95810 Presence of automatic (implantable) cardiac defibrillator: Secondary | ICD-10-CM | POA: Diagnosis not present

## 2017-09-17 DIAGNOSIS — M109 Gout, unspecified: Secondary | ICD-10-CM | POA: Diagnosis not present

## 2017-09-17 DIAGNOSIS — E1151 Type 2 diabetes mellitus with diabetic peripheral angiopathy without gangrene: Secondary | ICD-10-CM | POA: Diagnosis not present

## 2017-09-17 DIAGNOSIS — I5022 Chronic systolic (congestive) heart failure: Secondary | ICD-10-CM | POA: Diagnosis not present

## 2017-09-17 DIAGNOSIS — I4891 Unspecified atrial fibrillation: Secondary | ICD-10-CM | POA: Diagnosis not present

## 2017-09-17 DIAGNOSIS — I110 Hypertensive heart disease with heart failure: Secondary | ICD-10-CM | POA: Diagnosis not present

## 2017-09-17 DIAGNOSIS — Z951 Presence of aortocoronary bypass graft: Secondary | ICD-10-CM | POA: Diagnosis not present

## 2017-09-17 DIAGNOSIS — F17210 Nicotine dependence, cigarettes, uncomplicated: Secondary | ICD-10-CM | POA: Diagnosis not present

## 2017-09-17 DIAGNOSIS — I2510 Atherosclerotic heart disease of native coronary artery without angina pectoris: Secondary | ICD-10-CM | POA: Diagnosis not present

## 2017-09-17 DIAGNOSIS — D649 Anemia, unspecified: Secondary | ICD-10-CM | POA: Diagnosis not present

## 2017-09-17 DIAGNOSIS — Z794 Long term (current) use of insulin: Secondary | ICD-10-CM | POA: Diagnosis not present

## 2017-09-17 NOTE — Progress Notes (Signed)
Paramedicine Encounter    Patient ID: Jacob [name], male    DOB: 02-24-1956, 62 y.o.   MRN: [id number]    Patient Care Team: Hoy Register, MD as PCP - General (Family Medicine) Clarisa Schools, RN as Registered Nurse Jena Gauss, Gerrit Friends, MD as Consulting Physician (Gastroenterology) Pleasant, Dennard Schaumann, RN as [facility] Care Management  Patient Active Problem List   Diagnosis Date Noted  . Hypokalemia   . Vitamin D deficiency 07/07/2017  . Exertional angina (HCC)   . Chronic systolic heart failure (HCC) 12/17/2016  . Hyperglycemia 12/02/2016  . Osteoarthritis of right knee 12/02/2016  . Congestive heart disease (HCC) 01/01/2016  . Diabetic neuropathy (HCC) 11/28/2015  . Ascites 11/09/2015  . CHF (congestive heart failure) (HCC) 11/09/2015  . Insomnia 04/18/2015  . Claudication of right lower extremity (HCC) 04/07/2015  . Cardiomyopathy, ischemic 04/07/2015  . Chronic anticoagulation 03/30/2015  . Cardiorenal syndrome with renal failure   . SOB (shortness of breath)   . Morbid obesity due to excess calories (HCC)   . Helicobacter pylori ab+ 12/12/2014  . Calf pain   . Duodenal ulcer with hemorrhage   . Hemorrhagic shock (HCC)   . Cardiogenic shock (HCC)   . Diabetes mellitus (HCC)   . Coronary artery disease involving native coronary artery of native heart without angina pectoris   . Atrial fibrillation with rapid ventricular response (HCC) 10/29/2014  . Chest pain   . Atherosclerosis of native arteries of extremity with intermittent claudication (HCC) 09/06/2014  . Atrial fibrillation with controlled ventricular response (HCC) 08/03/2014  . Bacteremia   . DM (diabetes mellitus), type 2 with peripheral vascular complications (HCC) 08/01/2014  . Anemia of chronic disease 08/01/2014  . PVD (peripheral vascular disease) (HCC) 10/29/2013  . Tobacco abuse 05/13/2013  . AF (atrial fibrillation) (HCC) 02/11/2013  . ICD - in place- BS May 2014 [facility] 02/11/2013  .  Microcytic anemia 01/11/2013  . CAD- s/p CABG July 2014 [facility] 12/04/2011  . Noncompliance 12/01/2011  . Essential hypertension 10/30/2006    Current Outpatient Medications:  .  digoxin (LANOXIN) 0.125 MG tablet, Take 0.5 tablets (0.0625 mg total) by mouth daily., Disp: 14 tablet, Rfl: 3 .  gabapentin (NEURONTIN) 300 MG capsule, Take 1 capsule (300 mg total) by mouth daily., Disp: 30 capsule, Rfl: 3 .  glipiZIDE (GLUCOTROL) 10 MG tablet, Take 1 tablet (10 mg total) by mouth 2 (two) times daily before a meal., Disp: 60 tablet, Rfl: 5 .  hydrALAZINE (APRESOLINE) 25 MG tablet, take 1 TABLET BY MOUTH THREE TIMES DAILY every morning, noon,evening, Disp: 90 tablet, Rfl: 3 .  isosorbide mononitrate (IMDUR) 30 MG 24 hr tablet, Take 1 tablet (30 mg total) by mouth daily., Disp: 30 tablet, Rfl: 3 .  meclizine (ANTIVERT) 25 MG tablet, TAKE 1 TABLET (25 MG TOTAL) BY MOUTH 3 (THREE) TIMES DAILY AS NEEDED FOR DIZZINESS., Disp: 60 tablet, Rfl: 1 .  rivaroxaban (XARELTO) 20 MG TABS tablet, take one 20mg  tablet daily by mouth, Disp: 30 tablet, Rfl: 3 .  torsemide (DEMADEX) 20 MG tablet, Take 4 tablets (80 mg total) by mouth 2 (two) times daily., Disp: 240 tablet, Rfl: 3 .  ACCU-CHEK SOFTCLIX LANCETS lancets, Use for once daily testing of blood sugar (Patient not taking: Reported on 06/11/2017), Disp: 100 each, Rfl: 12 .  acetaminophen-codeine (TYLENOL #3) 300-30 MG tablet, TAKE 1 TABLET BY MOUTH AT BEDTIME AS NEEDED FOR MODERATE PAIN., Disp: 30 tablet, Rfl: 0 .  albuterol (PROVENTIL HFA;VENTOLIN  HFA) 108 (90 Base) MCG/ACT inhaler, Inhale 2 puffs into the lungs every 6 (six) hours as needed for wheezing or shortness of breath., Disp: 1 Inhaler, Rfl: 2 .  albuterol (PROVENTIL) (2.5 MG/3ML) 0.083% nebulizer solution, Take 3 mLs (2.5 mg total) by nebulization every 6 (six) hours as needed for wheezing or shortness of breath. (Patient not taking: Reported on 06/11/2017), Disp: 150 mL, Rfl: 1 .  allopurinol  (ZYLOPRIM) 100 MG tablet, Take 2 tablets (200 mg total) by mouth daily.(AM), Disp: 60 tablet, Rfl: 3 .  atorvastatin (LIPITOR) 40 MG tablet, take 1 TABLET BY MOUTH EVERY EVENING at 6pm, Disp: 30 tablet, Rfl: 6 .  Blood Glucose Monitoring Suppl (ACCU-CHEK AVIVA) device, Use as instructed1 times daily before meals (Patient not taking: Reported on 06/11/2017), Disp: 1 each, Rfl: 0 .  diclofenac sodium (VOLTAREN) 1 % GEL, APPLY 4 GRAMS TOPICALLY 4 (FOUR) TIMES DAILY, Disp: 100 g, Rfl: 1 .  ergocalciferol (DRISDOL) 50000 units capsule, Take 1 capsule (50,000 Units total) by mouth once a week., Disp: 4 capsule, Rfl: 1 .  glucose blood (ACCU-CHEK AVIVA) test strip, Use as instructed for 1 times daily testing of blood sugar (Patient not taking: Reported on 06/11/2017), Disp: 100 each, Rfl: 12 .  Insulin Glargine (LANTUS) 100 UNIT/ML Solostar Pen, Inject 50 Units into the skin daily at 10 pm., Disp: 5 pen, Rfl: 5 .  Lancet Devices (ACCU-CHEK SOFTCLIX) lancets, Use as instructed for once daily testing of blood sugar (Patient not taking: Reported on 06/02/2017), Disp: 1 each, Rfl: 0 .  metolazone (ZAROXOLYN) 2.5 MG tablet, Take 1 tablet once as directed by CHF clinic., Disp: 5 tablet, Rfl: 0 .  spironolactone (ALDACTONE) 25 MG tablet, Take 1 tablet (25 mg total) by mouth daily., Disp: 30 tablet, Rfl: 0 .  traZODone (DESYREL) 100 MG tablet, Take 1 tablet (100 mg total) by mouth at bedtime as needed for sleep. (Patient not taking: Reported on 06/11/2017), Disp: 30 tablet, Rfl: 3 Allergies  Allergen Reactions  . Pork-Derived Products Other (See Comments)    Religious preference     Social History   Socioeconomic History  . Marital status: Divorced    Spouse name: Not on file  . Number of children: 3  . Years of education: Not on file  . Highest education level: Not on file  Occupational History  . Occupation: disabled  Social Needs  . Financial resource strain: Not on file  . Food insecurity:    Worry:  Not on file    Inability: Not on file  . Transportation needs:    Medical: Not on file    Non-medical: Not on file  Tobacco Use  . Smoking status: Current Every Day Smoker    Packs/day: 0.50    Years: 40.00    Pack years: 20.00    Types: Cigarettes  . Smokeless tobacco: Never Used  Substance and Sexual Activity  . Alcohol use: No    Alcohol/week: 0.0 oz  . Drug use: No  . Sexual activity: Never  Lifestyle  . Physical activity:    Days per week: Not on file    Minutes per session: Not on file  . Stress: Not on file  Relationships  . Social connections:    Talks on phone: Not on file    Gets together: Not on file    Attends religious service: Not on file    Active member of club or organization: Not on file    Attends meetings of clubs  or organizations: Not on file    Relationship status: Not on file  . Intimate partner violence:    Fear of current or ex partner: Not on file    Emotionally abused: Not on file    Physically abused: Not on file    Forced sexual activity: Not on file  Other Topics Concern  . Not on file  Social History Narrative   Has an apartment with a roommate. He was living on the streets in 01-18-13.  He reports that his father died in [place] in Jan 18, 2013.  He is divorced.  He is no longer estranged from his son, but still from his daughter who lives locally.  Neither of his parents, nor any siblings have any history of CAD.    Physical Exam  Pulmonary/Chest: Effort normal. No respiratory distress. He has no wheezes. He has no rales.  Abdominal: Soft.  Musculoskeletal: He exhibits no edema.  Skin: Skin is warm and dry. He is not diaphoretic.        Future Appointments  Date Time Provider Department Center  09/29/2017 11:00 AM Nada Libman, MD [facility]  09/30/2017 10:00 AM [facility] PA/NP [facility] None  11/05/2017  2:30 PM Newlin, Odette Horns, MD [facility] None    ATF pt CAO x4 sitting on the side of the bed c/o weakness. Pt came home 6/22  from hospital for receiving blood.  Pt stated that he felt dizzy, therefore he went to the ED.  Pt was sent home with a prescription of spirolactone.  Pt's pill bubble packs verified (no errors).  [facility] to pt's care team; nurse and PT.  Pt stated that they will began next week.    **pt doesn't have transportation therefore I will pick up pt's meds and bring them back.  [facility]  BP 116/80 (BP Location: Right Arm, Patient Position: Sitting, Cuff Size: Normal)   Pulse (!) 101   Resp 16   Wt 242 lb (109.8 kg)   SpO2 98%   BMI 40.27 kg/m  CBG 249  Weight yesterday-242 Last visit weight-236  **rx needed: Albuterol inhaler Spirolactone (discharged rx)    Qusay Villada, EMT Paramedic 09/17/2017    ACTION: Home visit completed

## 2017-09-25 DIAGNOSIS — I4891 Unspecified atrial fibrillation: Secondary | ICD-10-CM | POA: Diagnosis not present

## 2017-09-25 DIAGNOSIS — D649 Anemia, unspecified: Secondary | ICD-10-CM | POA: Diagnosis not present

## 2017-09-25 DIAGNOSIS — I2510 Atherosclerotic heart disease of native coronary artery without angina pectoris: Secondary | ICD-10-CM | POA: Diagnosis not present

## 2017-09-25 DIAGNOSIS — E1151 Type 2 diabetes mellitus with diabetic peripheral angiopathy without gangrene: Secondary | ICD-10-CM | POA: Diagnosis not present

## 2017-09-25 DIAGNOSIS — I110 Hypertensive heart disease with heart failure: Secondary | ICD-10-CM | POA: Diagnosis not present

## 2017-09-25 DIAGNOSIS — I5022 Chronic systolic (congestive) heart failure: Secondary | ICD-10-CM | POA: Diagnosis not present

## 2017-09-26 ENCOUNTER — Telehealth: Payer: Self-pay | Admitting: Surgery

## 2017-09-29 ENCOUNTER — Encounter: Payer: Medicare Other | Admitting: Surgery

## 2017-09-30 ENCOUNTER — Encounter (HOSPITAL_COMMUNITY): Payer: Self-pay

## 2017-09-30 ENCOUNTER — Telehealth: Payer: Self-pay | Admitting: Family Medicine

## 2017-09-30 ENCOUNTER — Other Ambulatory Visit (HOSPITAL_COMMUNITY): Payer: Self-pay

## 2017-09-30 ENCOUNTER — Ambulatory Visit (HOSPITAL_COMMUNITY)
Admission: RE | Admit: 2017-09-30 | Discharge: 2017-09-30 | Disposition: A | Discharge status: Home / Self Care | Payer: Medicare Other | Source: Ambulatory Visit | Attending: Cardiology | Admitting: Cardiology

## 2017-09-30 VITALS — BP 130/72 | HR 107 | Wt 250.4 lb

## 2017-09-30 DIAGNOSIS — I252 Old myocardial infarction: Secondary | ICD-10-CM | POA: Diagnosis not present

## 2017-09-30 DIAGNOSIS — E876 Hypokalemia: Secondary | ICD-10-CM | POA: Insufficient documentation

## 2017-09-30 DIAGNOSIS — I255 Ischemic cardiomyopathy: Secondary | ICD-10-CM

## 2017-09-30 DIAGNOSIS — I2510 Atherosclerotic heart disease of native coronary artery without angina pectoris: Secondary | ICD-10-CM | POA: Insufficient documentation

## 2017-09-30 DIAGNOSIS — I481 Persistent atrial fibrillation: Secondary | ICD-10-CM | POA: Diagnosis not present

## 2017-09-30 DIAGNOSIS — I4819 Other persistent atrial fibrillation: Secondary | ICD-10-CM

## 2017-09-30 DIAGNOSIS — I482 Chronic atrial fibrillation: Secondary | ICD-10-CM | POA: Diagnosis not present

## 2017-09-30 DIAGNOSIS — E7800 Pure hypercholesterolemia, unspecified: Secondary | ICD-10-CM | POA: Insufficient documentation

## 2017-09-30 DIAGNOSIS — F17210 Nicotine dependence, cigarettes, uncomplicated: Secondary | ICD-10-CM | POA: Insufficient documentation

## 2017-09-30 DIAGNOSIS — Z91199 Patient's noncompliance with other medical treatment and regimen due to unspecified reason: Secondary | ICD-10-CM

## 2017-09-30 DIAGNOSIS — Z79899 Other long term (current) drug therapy: Secondary | ICD-10-CM | POA: Insufficient documentation

## 2017-09-30 DIAGNOSIS — Z95810 Presence of automatic (implantable) cardiac defibrillator: Secondary | ICD-10-CM | POA: Diagnosis not present

## 2017-09-30 DIAGNOSIS — Z794 Long term (current) use of insulin: Secondary | ICD-10-CM | POA: Insufficient documentation

## 2017-09-30 DIAGNOSIS — Z951 Presence of aortocoronary bypass graft: Secondary | ICD-10-CM | POA: Insufficient documentation

## 2017-09-30 DIAGNOSIS — I10 Essential (primary) hypertension: Secondary | ICD-10-CM | POA: Diagnosis not present

## 2017-09-30 DIAGNOSIS — Z7902 Long term (current) use of antithrombotics/antiplatelets: Secondary | ICD-10-CM | POA: Insufficient documentation

## 2017-09-30 DIAGNOSIS — E1151 Type 2 diabetes mellitus with diabetic peripheral angiopathy without gangrene: Secondary | ICD-10-CM | POA: Insufficient documentation

## 2017-09-30 DIAGNOSIS — I5022 Chronic systolic (congestive) heart failure: Secondary | ICD-10-CM | POA: Insufficient documentation

## 2017-09-30 DIAGNOSIS — Z9114 Patient's other noncompliance with medication regimen: Secondary | ICD-10-CM | POA: Diagnosis not present

## 2017-09-30 DIAGNOSIS — Z9119 Patient's noncompliance with other medical treatment and regimen: Secondary | ICD-10-CM

## 2017-09-30 DIAGNOSIS — Z9111 Patient's noncompliance with dietary regimen: Secondary | ICD-10-CM | POA: Insufficient documentation

## 2017-09-30 LAB — BASIC METABOLIC PANEL
Anion gap: 10 (ref 5–15)
BUN: 21 mg/dL — AB (ref 6–20)
CHLORIDE: 94 mmol/L — AB (ref 98–111)
CO2: 31 mmol/L (ref 22–32)
CREATININE: 1.36 mg/dL — AB (ref 0.61–1.24)
Calcium: 8.3 mg/dL — ABNORMAL LOW (ref 8.9–10.3)
GFR calc Af Amer: >60 mL/min (ref >60)
GFR calc non Af Amer: 55 mL/min — ABNORMAL LOW (ref >60)
GLUCOSE: 142 mg/dL — AB (ref 70–99)
Potassium: 2.9 mmol/L — ABNORMAL LOW (ref 3.5–5.1)
SODIUM: 135 mmol/L (ref 135–145)

## 2017-09-30 LAB — DIGOXIN LEVEL

## 2017-09-30 MED ORDER — POTASSIUM CHLORIDE CRYS ER 20 MEQ PO TBCR: 40.0000 meq | EXTENDED_RELEASE_TABLET | Freq: Every day | ORAL | 11 refills | Status: DC | PRN

## 2017-09-30 MED ORDER — METOLAZONE 2.5 MG PO TABS: ORAL_TABLET | ORAL | 0 refills | Status: DC

## 2017-09-30 NOTE — Telephone Encounter (Signed)
Trey Paula was called and given verbal orders for home health.

## 2017-09-30 NOTE — Patient Instructions (Addendum)
Take metolazone 2.5 mg tablet once for the next two days. Take 40 mg (2 tabs) of potassium once for the next two days (with metolazone).  Routine lab work today. Will notify you of abnormal results, otherwise no news is good news!  Follow up 6 weeks.  _______________________________________________________________ [place] Code: 1500  Take all medication as prescribed the day of your appointment. Bring all medications with you to your appointment.  Do the following things EVERYDAY: 1) Weigh yourself in the morning before breakfast. Write it down and keep it in a log. 2) Take your medicines as prescribed 3) Eat low salt foods-Limit salt (sodium) to 2000 mg per day.  4) Stay as active as you can everyday 5) Limit all fluids for the day to less than 2 liters

## 2017-09-30 NOTE — Progress Notes (Signed)
Paramedicine Encounter   Patient ID: Jacob [name] , male,   DOB: Oct 01, 1955,61 y.o.,  MRN: [id number]   Met patient in clinic today with provider Caryl Pina.  Pt came into clinic c/o difficulty breathing and being unable to sleep at night.  Pt thinks that he has gained weight due to him retaining fluid/weight @ home 246 or 247/clinic 250.  Pt stated that he hasn't been eating "much" very vague in his answer. Pt also stated that the pharmacy has packed his medications wrong/pt recieves bubble packs from [facility]/ I will verify his pill packs later today or tomorrow because he didn't bring his medications with him. Pt stated that he has been very sleepy throughout the day which is not his normal.   Pt is missing metolazone and albuterol from the pharmacy delivery.Aide is from [employer].     [place]: Pt reports dizziness when he stands; not urinating as normal.  Complaining of symptoms x10 days.  Pt has open sore left  leg/ red and non draining.  Dry cough; no fever or chills.  Fluid noted to legs and feet.  Take a metolazone today and tomorrow/ 40 meq of potassium with the metolazone.  F/u with him thursday.     Time spent with patient 40 mins  Hanya Guerin, EMT-Paramedic 09/30/2017   ACTION: Next visit planned for f/u this afternoon or tomorrow

## 2017-09-30 NOTE — Progress Notes (Signed)
Patient ID: Jacob [name], male   DOB: 01/07/1956, 62 y.o.   MRN: [id number]    Advanced Heart Failure Clinic Note   PCP: [name] Primary HF Cardiologist: Dr Jacob [name]   HPI: Jacob [name] is a 62 y.o. male [place] male with history of systolic HF EF 20-25% via Echo 08/03/14 s/p INCEPTA ICD implant 6/14, RV dysfunction, CAD s/p CABG x 2 w/ RF MAZE 7/14 at [facility], DM type II, Chronic afib on xarelto, GI bleed 11/28/2014, and HLD.   Admitted the end of August 2016  with increased dyspnea on exertion. Later found to be in cardiogenic shock . At one point on dual pressors milrinone and norepi. Diuresed with IV lasix and transitioned to po lasix. Hospital course complicated by GI bleed and A fib RVR. Loaded on amio but later placed on toprol xl for rate control. On 9/5 had EGD with duodenal bleed with clip applied. He was discharged 9/12 with D/C weight 203 pounds.   Admitted 1/29 - 04/24/17 with CP and HF. Cath 04/23/17 as below with stable CAD and compensated filling pressures after IV diuresis.   Admitted 6/21 - 09/13/17 for CP. Troponin flat at 0.06. He was diuresed with IV lasix, and then left on 09/13/17, though he was requested to stay one more day.   He presents today for post hospital follow up. He does not feel well today. He is SOB with minimal exertion. He has BLE edema and orthopnea. Weight has trended up at home 232 > 250 lbs over the last 10 days, although he is only up 7 lbs from recent discharge on our scale. Denies CP. He has occasional dizziness with rapid standing. Appetite is okay. Energy level is poor. Jacob fever/chills/cough. He continues to smoke 1 ppd. He drinks a lot of water, coca cola, and ice. Limits his salt intake. He occasionally misses day time medications. He gets a pill pack from the pharmacy, but takes out some of the medications. He says he takes his torsemide as ordered. Followed by HF paramedicine.   SH: Lives in low income housing. Estranged from  family  Review of systems complete and found to be negative unless listed in HPI.   [facility] 04/23/17  Ost RCA to Prox RCA lesion is 100% stenosed.  Ost Cx to Prox Cx lesion is 99% stenosed.  Ost LAD to Prox LAD lesion is 40% stenosed.  Prox LAD to Mid LAD lesion is 40% stenosed.  Dist LAD lesion is 70% stenosed.   Findings:  Ao = 126/66 (86)  LV = 118/14 RA = 8 RV = 44/8 PA = 52/17 (34) PCW = 17 Fick cardiac output/index = 4.2/2.0 PVR = 4.0 WU Ao sat = 98% PA sat = 60%, 61%  Assessment: 1. 3v CAD with stable revascularization with patent SVG to LPDA and SVG to Ramus 2. LAD with non-obstructive disease 3. Filling pressures normal 4. Moderately depressed cardiac output  SH:  Social History   Socioeconomic History  . Marital status: Divorced    Spouse name: Not on file  . Number of children: 3  . Years of education: Not on file  . Highest education level: Not on file  Occupational History  . Occupation: disabled  Social Needs  . Financial resource strain: Not on file  . Food insecurity:    Worry: Not on file    Inability: Not on file  . Transportation needs:    Medical: Not on file    Non-medical: Not on file  Tobacco Use  . Smoking status: Current Every Day Smoker    Packs/day: 0.50    Years: 40.00    Pack years: 20.00    Types: Cigarettes  . Smokeless tobacco: Never Used  Substance and Sexual Activity  . Alcohol use: Jacob    Alcohol/week: 0.0 oz  . Drug use: Jacob  . Sexual activity: Never  Lifestyle  . Physical activity:    Days per week: Not on file    Minutes per session: Not on file  . Stress: Not on file  Relationships  . Social connections:    Talks on phone: Not on file    Gets together: Not on file    Attends religious service: Not on file    Active member of club or organization: Not on file    Attends meetings of clubs or organizations: Not on file    Relationship status: Not on file  . Intimate partner violence:    Fear of current or  ex partner: Not on file    Emotionally abused: Not on file    Physically abused: Not on file    Forced sexual activity: Not on file  Other Topics Concern  . Not on file  Social History Narrative   Has an apartment with a roommate. He was living on the streets in Jan 09, 2013.  He reports that his father died in [place] in 01-09-13.  He is divorced.  He is Jacob [name], but still from his daughter who lives locally.  Neither of his parents, nor any siblings have any history of CAD.   FH:  Family History  Problem Relation Age of Onset  . Diabetes Mother    Past Medical History:  Diagnosis Date  . Atrial fibrillation (HCC)    RVR 10/2014  . Automatic implantable cardioverter-defibrillator in situ   . CAD (coronary artery disease) Sept 2013   s/p cardiac cath showing occlusion of small RCA with collaterals  . CHF (congestive heart failure) (HCC)    20 to 25 % EF and RV dysfunction by 07/2014 echo   . Chronic anticoagulation    on xarelto.   . High cholesterol   . Hypertension   . Myocardial infarction (HCC) 2014  . Noncompliance    homelessness contributing.   . Peripheral arterial disease (HCC)   . Type II diabetes mellitus (HCC)    Current Outpatient Medications  Medication Sig Dispense Refill  . ACCU-CHEK SOFTCLIX LANCETS lancets Use for once daily testing of blood sugar 100 each 12  . acetaminophen-codeine (TYLENOL #3) 300-30 MG tablet TAKE 1 TABLET BY MOUTH AT BEDTIME AS NEEDED FOR MODERATE PAIN. 30 tablet 0  . albuterol (PROVENTIL HFA;VENTOLIN HFA) 108 (90 Base) MCG/ACT inhaler Inhale 2 puffs into the lungs every 6 (six) hours as needed for wheezing or shortness of breath. 1 Inhaler 2  . albuterol (PROVENTIL) (2.5 MG/3ML) 0.083% nebulizer solution Take 3 mLs (2.5 mg total) by nebulization every 6 (six) hours as needed for wheezing or shortness of breath. 150 mL 1  . allopurinol (ZYLOPRIM) 100 MG tablet Take 2 tablets (200 mg total) by mouth daily.(AM) 60  tablet 3  . atorvastatin (LIPITOR) 40 MG tablet take 1 TABLET BY MOUTH EVERY EVENING at 6pm 30 tablet 6  . Blood Glucose Monitoring Suppl (ACCU-CHEK AVIVA) device Use as instructed1 times daily before meals 1 each 0  . diclofenac sodium (VOLTAREN) 1 % GEL APPLY 4 GRAMS TOPICALLY 4 (FOUR) TIMES DAILY 100 g  1  . digoxin (LANOXIN) 0.125 MG tablet Take 0.5 tablets (0.0625 mg total) by mouth daily. 14 tablet 3  . ergocalciferol (DRISDOL) 50000 units capsule Take 1 capsule (50,000 Units total) by mouth once a week. 4 capsule 1  . gabapentin (NEURONTIN) 300 MG capsule Take 1 capsule (300 mg total) by mouth daily. 30 capsule 3  . glipiZIDE (GLUCOTROL) 10 MG tablet Take 1 tablet (10 mg total) by mouth 2 (two) times daily before a meal. 60 tablet 5  . glucose blood (ACCU-CHEK AVIVA) test strip Use as instructed for 1 times daily testing of blood sugar 100 each 12  . hydrALAZINE (APRESOLINE) 25 MG tablet take 1 TABLET BY MOUTH THREE TIMES DAILY every morning, noon,evening 90 tablet 3  . Insulin Glargine (LANTUS) 100 UNIT/ML Solostar Pen Inject 50 Units into the skin daily at 10 pm. 5 pen 5  . isosorbide mononitrate (IMDUR) 30 MG 24 hr tablet Take 1 tablet (30 mg total) by mouth daily. 30 tablet 3  . Lancet Devices (ACCU-CHEK SOFTCLIX) lancets Use as instructed for once daily testing of blood sugar 1 each 0  . meclizine (ANTIVERT) 25 MG tablet TAKE 1 TABLET (25 MG TOTAL) BY MOUTH 3 (THREE) TIMES DAILY AS NEEDED FOR DIZZINESS. 60 tablet 1  . rivaroxaban (XARELTO) 20 MG TABS tablet take one 20mg  tablet daily by mouth 30 tablet 3  . spironolactone (ALDACTONE) 25 MG tablet Take 1 tablet (25 mg total) by mouth daily. 30 tablet 0  . torsemide (DEMADEX) 20 MG tablet Take 4 tablets (80 mg total) by mouth 2 (two) times daily. 240 tablet 3  . traZODone (DESYREL) 100 MG tablet Take 1 tablet (100 mg total) by mouth at bedtime as needed for sleep. 30 tablet 3  . metolazone (ZAROXOLYN) 2.5 MG tablet Take 1 tablet once as  directed by CHF clinic. (Patient not taking: Reported on 09/30/2017) 5 tablet 0   Jacob current facility-administered medications for this encounter.    Vitals:   09/30/17 1417  BP: 130/72  Pulse: (!) 107  SpO2: 94%  Weight: 250 lb 6.4 oz (113.6 kg)   Wt Readings from Last 3 Encounters:  09/30/17 250 lb 6.4 oz (113.6 kg)  09/17/17 242 lb (109.8 kg)  09/12/17 243 lb 13.3 oz (110.6 kg)     PHYSICAL EXAM: General: Appears chronically ill. Jacob resp difficulty. HEENT: Normal Neck: Supple. JVP 12. Carotids 2+ bilat; Jacob bruits. Jacob thyromegaly or nodule noted. Cor: PMI nondisplaced. IRR, tachy, Jacob M/G/R noted Lungs: CTAB, normal effort. Abdomen: Soft, non-tender, non-distended, Jacob HSM. Jacob bruits or masses. +BS  Extremities: Jacob cyanosis, clubbing, or rash. R and LLE 1+ edema Neuro: Alert & orientedx3, cranial nerves grossly intact. moves all 4 extremities w/o difficulty. Affect pleasant   ASSESSMENT & PLAN: 1. Chronic Systolic Heart Failure: Echo 10/2015 EF 25-30%. Has [employer] ICD. Echo 08/2017: EF 20-25% - R/LHC 03/2017 as above with stable CAD and compensated filling pressures. CI 2.0 - NYHA III - Volume status elevated. 7 lbs up from discharge in setting of dietary and medication noncompliance.  - Continue torsemide 80 mg BID. Take metolazone x 2 days with 40 meq potassium. BMET today. - Continue spiro 25 mg daily.  - Continue digoxin 0.0625 mg daily. Dig level today.  - Continue hydralazine 25 mg TID + imdur 30 mg daily.  - Jacob beta blocker with acute decompensation - Reinforced fluid restriction to < 2 L daily, sodium restriction to less than 2000 mg daily, and the  importance of daily weights.    2. CAD s/p CABG 2014 - R/LHC 03/2017 as above with stable CAD and compensated filling pressures.  - Jacob s/s ischemia - Continue xarelto, statin  2. Chronic A fib - Rate mildly elevated today in setting of volume overload.  - Continue Xarelto for anticoagulation. Denies bleeding.  -  High Res Chest CT with questionable fibrotic interstitial pneumonitis cannot be excluded.  - Jacob longer on amiodarone due to changes on Chest CT.   3. H/O GI Bleed - duodenal ulcer with clip 11/2003 - Denies bleeding.    4. PAD- Last ABI 2015  - R EIA stent with known bilateral SFA occlusion.  - Follows with Dr. Allyson Sabal: "He is not a candidate for endovascular therapy of his SFAs and would require femoropopliteal bypass grafting which I think he would be high risk for given his cardiac situation" - Continues to smoke. Encouraged complete cessation. Jacob change.   5. Social  Issues - Resides in low income housing.  - Nurse, adult. Jacob change.   6. Smoking - Continues to smoke 1 ppd. Encouraged complete cessation.   7. HTN - Mildly elevated today in setting of volume overload.   8. Hypokalemia - BMET today.   BMET, dig level today Metolazone 2.5 mg x 2 days 40 meq potassium x 2 days HF paramedicine to follow up later this week.  Follow up in 6 weeks  Alford Highland, NP  2:27 PM   Greater than 50% of the 25 minute visit was spent in counseling/coordination of care regarding disease state education, salt/fluid restriction, sliding scale diuretics, and medication compliance.

## 2017-09-30 NOTE — Telephone Encounter (Signed)
Jacob [name] form [facility] called requesting VO for home health physical therapy  2 x2weeks, 1 x2weeks and 1 every other week for 4 weeks. Please f/u

## 2017-10-01 ENCOUNTER — Telehealth (HOSPITAL_COMMUNITY): Payer: Self-pay | Admitting: *Deleted

## 2017-10-01 DIAGNOSIS — I5022 Chronic systolic (congestive) heart failure: Secondary | ICD-10-CM

## 2017-10-01 NOTE — Telephone Encounter (Signed)
Result Notes for Basic metabolic panel   Notes recorded by Georgina Peer, RN on 10/01/2017 at 8:39 AM EDT Called and spoke with patient and he is taking an additional 60 mEq of potassium this morning. Lab appt scheduled and bmet ordered. ------  Notes recorded by Alford Highland, NP on 10/01/2017 at 7:53 AM EDT Please call and have him take an additional 60 meq of potassium today. He is scheduled to take 40 meq with the metolazone today. He will need a repeat BMET in 1 week. Dee follows with him. Thanks

## 2017-10-02 DIAGNOSIS — D649 Anemia, unspecified: Secondary | ICD-10-CM | POA: Diagnosis not present

## 2017-10-02 DIAGNOSIS — I4891 Unspecified atrial fibrillation: Secondary | ICD-10-CM | POA: Diagnosis not present

## 2017-10-02 DIAGNOSIS — E1151 Type 2 diabetes mellitus with diabetic peripheral angiopathy without gangrene: Secondary | ICD-10-CM | POA: Diagnosis not present

## 2017-10-02 DIAGNOSIS — I110 Hypertensive heart disease with heart failure: Secondary | ICD-10-CM | POA: Diagnosis not present

## 2017-10-02 DIAGNOSIS — I2510 Atherosclerotic heart disease of native coronary artery without angina pectoris: Secondary | ICD-10-CM | POA: Diagnosis not present

## 2017-10-02 DIAGNOSIS — I5022 Chronic systolic (congestive) heart failure: Secondary | ICD-10-CM | POA: Diagnosis not present

## 2017-10-06 DIAGNOSIS — I110 Hypertensive heart disease with heart failure: Secondary | ICD-10-CM | POA: Diagnosis not present

## 2017-10-06 DIAGNOSIS — D649 Anemia, unspecified: Secondary | ICD-10-CM | POA: Diagnosis not present

## 2017-10-06 DIAGNOSIS — I2510 Atherosclerotic heart disease of native coronary artery without angina pectoris: Secondary | ICD-10-CM | POA: Diagnosis not present

## 2017-10-06 DIAGNOSIS — E1151 Type 2 diabetes mellitus with diabetic peripheral angiopathy without gangrene: Secondary | ICD-10-CM | POA: Diagnosis not present

## 2017-10-06 DIAGNOSIS — I5022 Chronic systolic (congestive) heart failure: Secondary | ICD-10-CM | POA: Diagnosis not present

## 2017-10-06 DIAGNOSIS — I4891 Unspecified atrial fibrillation: Secondary | ICD-10-CM | POA: Diagnosis not present

## 2017-10-08 ENCOUNTER — Ambulatory Visit (HOSPITAL_COMMUNITY)
Admission: RE | Admit: 2017-10-08 | Discharge: 2017-10-08 | Disposition: A | Discharge status: Home / Self Care | Payer: Medicare Other | Source: Ambulatory Visit | Attending: Cardiology | Admitting: Cardiology

## 2017-10-08 DIAGNOSIS — I5022 Chronic systolic (congestive) heart failure: Secondary | ICD-10-CM | POA: Diagnosis not present

## 2017-10-08 LAB — BASIC METABOLIC PANEL
ANION GAP: 17 — AB (ref 5–15)
BUN: 40 mg/dL — ABNORMAL HIGH (ref 6–20)
CALCIUM: 8 mg/dL — AB (ref 8.9–10.3)
CO2: 25 mmol/L (ref 22–32)
CREATININE: 1.44 mg/dL — AB (ref 0.61–1.24)
Chloride: 88 mmol/L — ABNORMAL LOW (ref 98–111)
GFR, EST AFRICAN AMERICAN: 60 mL/min — AB (ref >60)
GFR, EST NON AFRICAN AMERICAN: 51 mL/min — AB (ref >60)
GLUCOSE: 252 mg/dL — AB (ref 70–99)
Potassium: 3.6 mmol/L (ref 3.5–5.1)
Sodium: 130 mmol/L — ABNORMAL LOW (ref 135–145)

## 2017-10-31 DIAGNOSIS — E1151 Type 2 diabetes mellitus with diabetic peripheral angiopathy without gangrene: Secondary | ICD-10-CM | POA: Diagnosis not present

## 2017-10-31 DIAGNOSIS — I4891 Unspecified atrial fibrillation: Secondary | ICD-10-CM | POA: Diagnosis not present

## 2017-10-31 DIAGNOSIS — I5022 Chronic systolic (congestive) heart failure: Secondary | ICD-10-CM | POA: Diagnosis not present

## 2017-10-31 DIAGNOSIS — D649 Anemia, unspecified: Secondary | ICD-10-CM | POA: Diagnosis not present

## 2017-10-31 DIAGNOSIS — I2510 Atherosclerotic heart disease of native coronary artery without angina pectoris: Secondary | ICD-10-CM | POA: Diagnosis not present

## 2017-10-31 DIAGNOSIS — I110 Hypertensive heart disease with heart failure: Secondary | ICD-10-CM | POA: Diagnosis not present

## 2017-11-05 ENCOUNTER — Ambulatory Visit: Payer: Medicare Other | Admitting: Family Medicine

## 2017-11-11 ENCOUNTER — Encounter (HOSPITAL_COMMUNITY): Payer: Medicare Other

## 2017-11-13 DIAGNOSIS — I4891 Unspecified atrial fibrillation: Secondary | ICD-10-CM | POA: Diagnosis not present

## 2017-11-13 DIAGNOSIS — I5022 Chronic systolic (congestive) heart failure: Secondary | ICD-10-CM | POA: Diagnosis not present

## 2017-11-13 DIAGNOSIS — I2510 Atherosclerotic heart disease of native coronary artery without angina pectoris: Secondary | ICD-10-CM | POA: Diagnosis not present

## 2017-11-13 DIAGNOSIS — E1151 Type 2 diabetes mellitus with diabetic peripheral angiopathy without gangrene: Secondary | ICD-10-CM | POA: Diagnosis not present

## 2017-11-13 DIAGNOSIS — D649 Anemia, unspecified: Secondary | ICD-10-CM | POA: Diagnosis not present

## 2017-11-13 DIAGNOSIS — I110 Hypertensive heart disease with heart failure: Secondary | ICD-10-CM | POA: Diagnosis not present

## 2017-11-14 ENCOUNTER — Other Ambulatory Visit (HOSPITAL_COMMUNITY): Payer: Self-pay | Admitting: *Deleted

## 2017-11-14 ENCOUNTER — Telehealth: Payer: Self-pay

## 2017-11-14 ENCOUNTER — Other Ambulatory Visit (HOSPITAL_COMMUNITY): Payer: Self-pay

## 2017-11-14 DIAGNOSIS — I255 Ischemic cardiomyopathy: Secondary | ICD-10-CM

## 2017-11-14 MED ORDER — METOLAZONE 2.5 MG PO TABS: ORAL_TABLET | ORAL | 0 refills | Status: DC

## 2017-11-14 NOTE — Telephone Encounter (Signed)
Call received from [facility] Copper, EMT.  She said that her co-worker saw the patient.  His weight is up 4 lbs, lungs clear,  and the cardiologist ordered metolazone for him.  She will also put in an order for albuterol solution. The patient can request that it be filled at [facility] with his other prescriptions.

## 2017-11-14 NOTE — Progress Notes (Signed)
Paramedicine Encounter    Patient ID: Jacob [name], male    DOB: Nov 27, 1955, 62 y.o.   MRN: [id number]   Patient Care Team: Hoy Register, MD as PCP - General (Family Medicine) Clarisa Schools, RN as Registered Nurse Jena Gauss, Gerrit Friends, MD as Consulting Physician (Gastroenterology) Pleasant, Dennard Schaumann, RN as [facility] Care Management  Patient Active Problem List   Diagnosis Date Noted  . Hypokalemia   . Vitamin D deficiency 07/07/2017  . Exertional angina (HCC)   . Chronic systolic heart failure (HCC) 12/17/2016  . Hyperglycemia 12/02/2016  . Osteoarthritis of right knee 12/02/2016  . Congestive heart disease (HCC) 01/01/2016  . Diabetic neuropathy (HCC) 11/28/2015  . Ascites 11/09/2015  . CHF (congestive heart failure) (HCC) 11/09/2015  . Insomnia 04/18/2015  . Claudication of right lower extremity (HCC) 04/07/2015  . Cardiomyopathy, ischemic 04/07/2015  . Chronic anticoagulation 03/30/2015  . Cardiorenal syndrome with renal failure   . SOB (shortness of breath)   . Morbid obesity due to excess calories (HCC)   . Helicobacter pylori ab+ 12/12/2014  . Calf pain   . Duodenal ulcer with hemorrhage   . Hemorrhagic shock (HCC)   . Cardiogenic shock (HCC)   . Diabetes mellitus (HCC)   . Coronary artery disease involving native coronary artery of native heart without angina pectoris   . Atrial fibrillation with rapid ventricular response (HCC) 10/29/2014  . Chest pain   . Atherosclerosis of native arteries of extremity with intermittent claudication (HCC) 09/06/2014  . Atrial fibrillation with controlled ventricular response (HCC) 08/03/2014  . Bacteremia   . DM (diabetes mellitus), type 2 with peripheral vascular complications (HCC) 08/01/2014  . Anemia of chronic disease 08/01/2014  . PVD (peripheral vascular disease) (HCC) 10/29/2013  . Tobacco abuse 05/13/2013  . AF (atrial fibrillation) (HCC) 02/11/2013  . ICD - in place- BS May 2014 [facility] 02/11/2013  .  Microcytic anemia 01/11/2013  . CAD- s/p CABG July 2014 [facility] 12/04/2011  . Noncompliance 12/01/2011  . Essential hypertension 10/30/2006    Current Outpatient Medications:  .  ACCU-CHEK SOFTCLIX LANCETS lancets, Use for once daily testing of blood sugar, Disp: 100 each, Rfl: 12 .  acetaminophen-codeine (TYLENOL #3) 300-30 MG tablet, TAKE 1 TABLET BY MOUTH AT BEDTIME AS NEEDED FOR MODERATE PAIN., Disp: 30 tablet, Rfl: 0 .  albuterol (PROVENTIL HFA;VENTOLIN HFA) 108 (90 Base) MCG/ACT inhaler, Inhale 2 puffs into the lungs every 6 (six) hours as needed for wheezing or shortness of breath., Disp: 1 Inhaler, Rfl: 2 .  albuterol (PROVENTIL) (2.5 MG/3ML) 0.083% nebulizer solution, Take 3 mLs (2.5 mg total) by nebulization every 6 (six) hours as needed for wheezing or shortness of breath., Disp: 150 mL, Rfl: 1 .  allopurinol (ZYLOPRIM) 100 MG tablet, Take 2 tablets (200 mg total) by mouth daily.(AM), Disp: 60 tablet, Rfl: 3 .  atorvastatin (LIPITOR) 40 MG tablet, take 1 TABLET BY MOUTH EVERY EVENING at 6pm, Disp: 30 tablet, Rfl: 6 .  Blood Glucose Monitoring Suppl (ACCU-CHEK AVIVA) device, Use as instructed1 times daily before meals, Disp: 1 each, Rfl: 0 .  diclofenac sodium (VOLTAREN) 1 % GEL, APPLY 4 GRAMS TOPICALLY 4 (FOUR) TIMES DAILY, Disp: 100 g, Rfl: 1 .  digoxin (LANOXIN) 0.125 MG tablet, Take 0.5 tablets (0.0625 mg total) by mouth daily., Disp: 14 tablet, Rfl: 3 .  ergocalciferol (DRISDOL) 50000 units capsule, Take 1 capsule (50,000 Units total) by mouth once a week., Disp: 4 capsule, Rfl: 1 .  gabapentin (NEURONTIN)  300 MG capsule, Take 1 capsule (300 mg total) by mouth daily., Disp: 30 capsule, Rfl: 3 .  glipiZIDE (GLUCOTROL) 10 MG tablet, Take 1 tablet (10 mg total) by mouth 2 (two) times daily before a meal., Disp: 60 tablet, Rfl: 5 .  glucose blood (ACCU-CHEK AVIVA) test strip, Use as instructed for 1 times daily testing of blood sugar, Disp: 100 each, Rfl: 12 .  hydrALAZINE  (APRESOLINE) 25 MG tablet, take 1 TABLET BY MOUTH THREE TIMES DAILY every morning, noon,evening, Disp: 90 tablet, Rfl: 3 .  Insulin Glargine (LANTUS) 100 UNIT/ML Solostar Pen, Inject 50 Units into the skin daily at 10 pm., Disp: 5 pen, Rfl: 5 .  isosorbide mononitrate (IMDUR) 30 MG 24 hr tablet, Take 1 tablet (30 mg total) by mouth daily., Disp: 30 tablet, Rfl: 3 .  Lancet Devices (ACCU-CHEK SOFTCLIX) lancets, Use as instructed for once daily testing of blood sugar, Disp: 1 each, Rfl: 0 .  meclizine (ANTIVERT) 25 MG tablet, TAKE 1 TABLET (25 MG TOTAL) BY MOUTH 3 (THREE) TIMES DAILY AS NEEDED FOR DIZZINESS., Disp: 60 tablet, Rfl: 1 .  metolazone (ZAROXOLYN) 2.5 MG tablet, Take 1 tablet once as directed by CHF clinic., Disp: 5 tablet, Rfl: 0 .  potassium chloride SA (K-DUR,KLOR-CON) 20 MEQ tablet, Take 2 tablets (40 mEq total) by mouth daily as needed. TAKE ONLY ON DAYS YOU TAKE METOLAZONE!!, Disp: 60 tablet, Rfl: 11 .  rivaroxaban (XARELTO) 20 MG TABS tablet, take one 20mg  tablet daily by mouth, Disp: 30 tablet, Rfl: 3 .  spironolactone (ALDACTONE) 25 MG tablet, Take 1 tablet (25 mg total) by mouth daily., Disp: 30 tablet, Rfl: 0 .  torsemide (DEMADEX) 20 MG tablet, Take 4 tablets (80 mg total) by mouth 2 (two) times daily., Disp: 240 tablet, Rfl: 3 .  traZODone (DESYREL) 100 MG tablet, Take 1 tablet (100 mg total) by mouth at bedtime as needed for sleep., Disp: 30 tablet, Rfl: 3 Allergies  Allergen Reactions  . Pork-Derived Products Other (See Comments)    Religious preference      Social History   Socioeconomic History  . Marital status: Divorced    Spouse name: Not on file  . Number of children: 3  . Years of education: Not on file  . Highest education level: Not on file  Occupational History  . Occupation: disabled  Social Needs  . Financial resource strain: Not on file  . Food insecurity:    Worry: Not on file    Inability: Not on file  . Transportation needs:    Medical: Not  on file    Non-medical: Not on file  Tobacco Use  . Smoking status: Current Every Day Smoker    Packs/day: 0.50    Years: 40.00    Pack years: 20.00    Types: Cigarettes  . Smokeless tobacco: Never Used  Substance and Sexual Activity  . Alcohol use: No    Alcohol/week: 0.0 standard drinks  . Drug use: No  . Sexual activity: Never  Lifestyle  . Physical activity:    Days per week: Not on file    Minutes per session: Not on file  . Stress: Not on file  Relationships  . Social connections:    Talks on phone: Not on file    Gets together: Not on file    Attends religious service: Not on file    Active member of club or organization: Not on file    Attends meetings of clubs or organizations: Not  on file    Relationship status: Not on file  . Intimate partner violence:    Fear of current or ex partner: Not on file    Emotionally abused: Not on file    Physically abused: Not on file    Forced sexual activity: Not on file  Other Topics Concern  . Not on file  Social History Narrative   Has an apartment with a roommate. He was living on the streets in 08-Jan-2013.  He reports that his father died in [place] in 01-08-13.  He is divorced.  He is no longer estranged from his son, but still from his daughter who lives locally.  Neither of his parents, nor any siblings have any history of CAD.    Physical Exam  Constitutional: He is oriented to person, place, and time.  Neck: No JVD present.  Cardiovascular: An irregular rhythm present. Tachycardia present.  Pulmonary/Chest: Effort normal. He has wheezes in the right middle field, the right lower field, the left middle field and the left lower field.  Abdominal: Soft.  Musculoskeletal: Normal range of motion. He exhibits edema.  Neurological: He is alert and oriented to person, place, and time.  Skin: Skin is warm and dry.  Psychiatric: He has a normal mood and affect.        Future Appointments  Date Time Provider  Department Center  11/25/2017  2:10 PM Hoy Register, MD [facility] None  12/23/2017  3:30 PM Hoy Register, MD [facility] None    BP 108/62 (BP Location: Left Arm, Patient Position: Sitting, Cuff Size: Large)   Pulse (!) 114   Resp 20   Wt 240 lb (108.9 kg)   SpO2 97%   BMI 39.94 kg/m   Weight yesterday- 236 lb  Last visit weight- 250 lb   Mr Moodie was seen at home today at the request of [facility] Copper. She received a call from the patient who was complaining of SOB after missing a clinic appointment last week. He was found to be wheezing slightly but was out of his MDI. I advised him to call his PCP and get it refilled because the clinic would not be able to call it in for him. His weight was up 3 lb overnight so I contacted the clinic who advised they would order metolazone and said for him to take 2.5 mg upon getting the medication. He was understanding and agreeable with these instructions. I reiterated the importance of making his clinic appointment and he was agreeable.   Jacqualine Code, EMT 11/14/17  ACTION: Home visit completed

## 2017-11-14 NOTE — Telephone Encounter (Signed)
Call received from the patient requesting an appointment with Dr Alvis Lemmings.  He said that he has been experiencing shortness of breath and can't sleep. He thinks he may be retaining fluid. Weight this morning : 236 lbs , blood sugar : 166. He did not feel that he needed to seek immediate medical attention and did not want to go to the ED.  Scheduled him an appointment with Dr Alvis Lemmings for 11/25/17 @ 1410. He said that he could wait until then to be seen.  Informed him that with his symptoms he should be seen before then.  He again said he wanted to wait until 11/25/17. This CM told him that [facility] Copper, EMT would be notified of his status and he was very Adult nurse.    Call placed to [facility] Copper, EMT, she said that the patient is no longer part of the [facility] program but she would plan to see him this afternoon about 1600 and would  message cardiology with an update on his status this CM did not need to contact cardiology  Call placed to patient and informed him that Geraldine Contras would be seeing him around 1600 today.  He again was very Adult nurse.

## 2017-11-17 ENCOUNTER — Other Ambulatory Visit: Payer: Self-pay | Admitting: Family Medicine

## 2017-11-17 DIAGNOSIS — I255 Ischemic cardiomyopathy: Secondary | ICD-10-CM

## 2017-11-17 DIAGNOSIS — E1159 Type 2 diabetes mellitus with other circulatory complications: Secondary | ICD-10-CM

## 2017-11-17 DIAGNOSIS — M1711 Unilateral primary osteoarthritis, right knee: Secondary | ICD-10-CM

## 2017-11-17 DIAGNOSIS — I482 Chronic atrial fibrillation, unspecified: Secondary | ICD-10-CM

## 2017-11-17 DIAGNOSIS — Z794 Long term (current) use of insulin: Secondary | ICD-10-CM

## 2017-11-17 NOTE — Telephone Encounter (Signed)
Patient has appointment 11/25/17. Will defer prn medications to PCP. I have refilled patient's scheduled medications.

## 2017-11-17 NOTE — Telephone Encounter (Signed)
Noted  

## 2017-11-20 ENCOUNTER — Telehealth: Payer: Self-pay

## 2017-11-20 NOTE — Telephone Encounter (Signed)
Attempted to contact the patient to check on him and to inquire if he needs transportation to his appointment at [facility] on 11/25/17. He said last week that he might need a cab.  Call placed to # [phone] (M) and message was left requesting call back to this CM or [place] # [phone].

## 2017-11-25 ENCOUNTER — Ambulatory Visit: Payer: Medicare Other | Attending: Family Medicine | Admitting: Family Medicine

## 2017-11-25 ENCOUNTER — Telehealth: Payer: Self-pay

## 2017-11-25 ENCOUNTER — Encounter: Payer: Self-pay | Admitting: Family Medicine

## 2017-11-25 VITALS — BP 94/65 | HR 114 | Temp 97.9°F | Ht 65.0 in | Wt 237.0 lb

## 2017-11-25 DIAGNOSIS — R0600 Dyspnea, unspecified: Secondary | ICD-10-CM | POA: Diagnosis not present

## 2017-11-25 DIAGNOSIS — Z955 Presence of coronary angioplasty implant and graft: Secondary | ICD-10-CM | POA: Diagnosis not present

## 2017-11-25 DIAGNOSIS — R0609 Other forms of dyspnea: Secondary | ICD-10-CM | POA: Diagnosis not present

## 2017-11-25 DIAGNOSIS — Z951 Presence of aortocoronary bypass graft: Secondary | ICD-10-CM | POA: Diagnosis not present

## 2017-11-25 DIAGNOSIS — I255 Ischemic cardiomyopathy: Secondary | ICD-10-CM | POA: Diagnosis not present

## 2017-11-25 DIAGNOSIS — I4891 Unspecified atrial fibrillation: Secondary | ICD-10-CM | POA: Diagnosis not present

## 2017-11-25 DIAGNOSIS — R42 Dizziness and giddiness: Secondary | ICD-10-CM | POA: Diagnosis not present

## 2017-11-25 DIAGNOSIS — I509 Heart failure, unspecified: Secondary | ICD-10-CM | POA: Diagnosis not present

## 2017-11-25 DIAGNOSIS — I2510 Atherosclerotic heart disease of native coronary artery without angina pectoris: Secondary | ICD-10-CM | POA: Insufficient documentation

## 2017-11-25 DIAGNOSIS — E1140 Type 2 diabetes mellitus with diabetic neuropathy, unspecified: Secondary | ICD-10-CM | POA: Diagnosis not present

## 2017-11-25 DIAGNOSIS — G4709 Other insomnia: Secondary | ICD-10-CM | POA: Diagnosis not present

## 2017-11-25 DIAGNOSIS — Z7901 Long term (current) use of anticoagulants: Secondary | ICD-10-CM | POA: Diagnosis not present

## 2017-11-25 DIAGNOSIS — E7800 Pure hypercholesterolemia, unspecified: Secondary | ICD-10-CM | POA: Diagnosis not present

## 2017-11-25 DIAGNOSIS — Z794 Long term (current) use of insulin: Secondary | ICD-10-CM | POA: Insufficient documentation

## 2017-11-25 DIAGNOSIS — I482 Chronic atrial fibrillation: Secondary | ICD-10-CM | POA: Insufficient documentation

## 2017-11-25 DIAGNOSIS — Z79899 Other long term (current) drug therapy: Secondary | ICD-10-CM | POA: Insufficient documentation

## 2017-11-25 DIAGNOSIS — E118 Type 2 diabetes mellitus with unspecified complications: Secondary | ICD-10-CM | POA: Diagnosis not present

## 2017-11-25 DIAGNOSIS — E1149 Type 2 diabetes mellitus with other diabetic neurological complication: Secondary | ICD-10-CM

## 2017-11-25 DIAGNOSIS — Z23 Encounter for immunization: Secondary | ICD-10-CM

## 2017-11-25 DIAGNOSIS — E1159 Type 2 diabetes mellitus with other circulatory complications: Secondary | ICD-10-CM | POA: Diagnosis not present

## 2017-11-25 DIAGNOSIS — I5042 Chronic combined systolic (congestive) and diastolic (congestive) heart failure: Secondary | ICD-10-CM | POA: Diagnosis not present

## 2017-11-25 DIAGNOSIS — Z95810 Presence of automatic (implantable) cardiac defibrillator: Secondary | ICD-10-CM | POA: Insufficient documentation

## 2017-11-25 DIAGNOSIS — I252 Old myocardial infarction: Secondary | ICD-10-CM | POA: Insufficient documentation

## 2017-11-25 DIAGNOSIS — I110 Hypertensive heart disease with heart failure: Secondary | ICD-10-CM | POA: Insufficient documentation

## 2017-11-25 LAB — POCT GLYCOSYLATED HEMOGLOBIN (HGB A1C): HBA1C, POC (CONTROLLED DIABETIC RANGE): 6.9 % (ref 0.0–7.0)

## 2017-11-25 LAB — GLUCOSE, POCT (MANUAL RESULT ENTRY): POC Glucose: 261 mg/dl — AB (ref 70–99)

## 2017-11-25 MED ORDER — ALBUTEROL SULFATE HFA 108 (90 BASE) MCG/ACT IN AERS: 2.0000 | INHALATION_SPRAY | Freq: Four times a day (QID) | RESPIRATORY_TRACT | 2 refills | Status: DC | PRN

## 2017-11-25 MED ORDER — TRAZODONE HCL 100 MG PO TABS: 100.0000 mg | ORAL_TABLET | Freq: Every evening | ORAL | 3 refills | Status: DC | PRN

## 2017-11-25 MED ORDER — CETIRIZINE HCL 10 MG PO TABS: 10.0000 mg | ORAL_TABLET | Freq: Every day | ORAL | 1 refills | Status: DC

## 2017-11-25 MED ORDER — SPIRONOLACTONE 25 MG PO TABS: 12.5000 mg | ORAL_TABLET | Freq: Every day | ORAL | 3 refills | Status: DC

## 2017-11-25 MED ORDER — GLIPIZIDE 10 MG PO TABS: 10.0000 mg | ORAL_TABLET | Freq: Two times a day (BID) | ORAL | 5 refills | Status: DC

## 2017-11-25 MED ORDER — ISOSORBIDE MONONITRATE ER 30 MG PO TB24: 30.0000 mg | ORAL_TABLET | Freq: Every day | ORAL | 3 refills | Status: DC

## 2017-11-25 MED ORDER — INSULIN GLARGINE 100 UNIT/ML SOLOSTAR PEN: 50.0000 [IU] | PEN_INJECTOR | Freq: Every day | SUBCUTANEOUS | 5 refills | Status: DC

## 2017-11-25 NOTE — Patient Instructions (Signed)

## 2017-11-25 NOTE — Progress Notes (Signed)
Subjective:  Patient ID: Jacob [name], male    DOB: 02-23-1956  Age: 62 y.o. 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 He complains of dizziness over the last 24 hours associated with turning of his head and change of position and also feels the room spinning.  Denies blurry vision, headaches.  He has been compliant with meclizine. Review of his blood pressure reveals a soft blood pressure and he endorses compliance with all his medications. Denies chest pain or pedal edema but does have some dyspnea which he attributes to running out of his MDI. Last week his dose of metolazone was increased by cardiology during his visit by the heart failure paramedic due to weight gain and his weight is stable today. With regards to his diabetes mellitus his A1c is 6.9 which has improved from 10.8 previously.  He denies hypoglycemia and his neuropathy is controlled on gabapentin.  He denies visual concerns.  Past Medical History:  Diagnosis Date  . Atrial fibrillation (HCC)    RVR 10/2014  . Automatic implantable cardioverter-defibrillator in situ   . CAD (coronary artery disease) Sept 2013   s/p cardiac cath showing occlusion of small RCA with collaterals  . CHF (congestive heart failure) (HCC)    20 to 25 % EF and RV dysfunction by 07/2014 echo   . Chronic anticoagulation    on xarelto.   . High cholesterol   . Hypertension   . Myocardial infarction (HCC) 2014  . Noncompliance    homelessness contributing.   . Peripheral  arterial disease (HCC)   . Type II diabetes mellitus (HCC)     Past Surgical History:  Procedure Laterality Date  . CARDIAC CATHETERIZATION  09/2012  . CORONARY ANGIOPLASTY WITH STENT PLACEMENT  11/2011   "1"  . CORONARY ARTERY BYPASS GRAFT  09/2012   2 vessels per patient [name]   . ESOPHAGOGASTRODUODENOSCOPY N/A 11/28/2014   Procedure: ESOPHAGOGASTRODUODENOSCOPY (EGD);  Surgeon: Beverley Fiedler, MD;  Location[facility] ENDOSCOPY;  Service: Endoscopy;  Laterality: N/A;  . ILIAC ARTERY STENT Right 08/30/2013  . IMPLANTABLE CARDIOVERTER DEFIBRILLATOR IMPLANT     [place] in 07/2012; [employer]  . LEFT AND RIGHT HEART CATHETERIZATION WITH CORONARY ANGIOGRAM N/A 12/02/2011   Procedure: LEFT AND RIGHT HEART CATHETERIZATION WITH CORONARY ANGIOGRAM;  Surgeon: Kathleene Hazel, MD;  Location: [facility] CATH LAB;  Service: Cardiovascular;  Laterality: N/A;  . LOWER EXTREMITY ANGIOGRAM N/A 08/30/2013   Procedure: LOWER EXTREMITY ANGIOGRAM;  Surgeon: Runell Gess, MD;  Location: [facility] CATH LAB;  Service: Cardiovascular;  Laterality: N/A;  . LOWER EXTREMITY ANGIOGRAM N/A 12/02/2013   Procedure: LOWER EXTREMITY ANGIOGRAM;  Surgeon: Runell Gess, MD;  Location: [facility] CATH LAB;  Service: Cardiovascular;  Laterality: N/A;  . RIGHT/LEFT HEART CATH AND CORONARY/GRAFT ANGIOGRAPHY N/A 04/23/2017   Procedure: RIGHT/LEFT HEART CATH AND CORONARY/GRAFT ANGIOGRAPHY;  Surgeon: Dolores Patty, MD;  Location: [facility] INVASIVE CV LAB;  Service: Cardiovascular;  Laterality: N/A;    Allergies  Allergen Reactions  . Pork-Derived Products Other (See Comments)    Religious preference  Outpatient Medications Prior to Visit  Medication Sig Dispense Refill  . ACCU-CHEK SOFTCLIX LANCETS lancets Use for once daily testing of blood sugar 100 each 12  . acetaminophen-codeine (TYLENOL #3) 300-30 MG tablet TAKE 1 TABLET BY MOUTH AT BEDTIME AS NEEDED FOR MODERATE PAIN. 30 tablet 0  . albuterol (PROVENTIL) (2.5 MG/3ML) 0.083%  nebulizer solution Take 3 mLs (2.5 mg total) by nebulization every 6 (six) hours as needed for wheezing or shortness of breath. 150 mL 1  . allopurinol (ZYLOPRIM) 100 MG tablet TAKE 2 TABLETS (200 MG TOTAL) BY MOUTH EVERY MORNING 60 tablet 3  . atorvastatin (LIPITOR) 40 MG tablet take 1 TABLET BY MOUTH EVERY EVENING at 6pm 30 tablet 6  . Blood Glucose Monitoring Suppl (ACCU-CHEK AVIVA) device Use as instructed1 times daily before meals 1 each 0  . diclofenac sodium (VOLTAREN) 1 % GEL APPLY 4 GRAMS TOPICALLY 4 (FOUR) TIMES DAILY 100 g 0  . digoxin (LANOXIN) 0.125 MG tablet TAKE 1/2 TABLET BY MOUTH DAILY 14 tablet 3  . gabapentin (NEURONTIN) 300 MG capsule TAKE 1 CAPSULE (300 MG TOTAL) BY MOUTH DAILY. 30 capsule 3  . glucose blood (ACCU-CHEK AVIVA) test strip Use as instructed for 1 times daily testing of blood sugar 100 each 12  . hydrALAZINE (APRESOLINE) 25 MG tablet TAKE 1 TABLET BY MOUTH THREE TIMES DAILY ( EVERY MORNING, NOON,EVENING ) 90 tablet 3  . Lancet Devices (ACCU-CHEK SOFTCLIX) lancets Use as instructed for once daily testing of blood sugar 1 each 0  . meclizine (ANTIVERT) 25 MG tablet TAKE 1 TABLET (25 MG TOTAL) BY MOUTH 3 (THREE) TIMES DAILY AS NEEDED FOR DIZZINESS. 60 tablet 1  . metolazone (ZAROXOLYN) 2.5 MG tablet Take 1 tablet once as directed by CHF clinic. 5 tablet 0  . potassium chloride SA (K-DUR,KLOR-CON) 20 MEQ tablet Take 2 tablets (40 mEq total) by mouth daily as needed. TAKE ONLY ON DAYS YOU TAKE METOLAZONE!! 60 tablet 11  . torsemide (DEMADEX) 20 MG tablet TAKE 4 TABLETS (80 MG TOTAL) BY MOUTH 2 (TWO) TIMES DAILY. 240 tablet 3  . XARELTO 20 MG TABS tablet TAKE ONE TABLET BY MOUTH ONCE DAILY 30 tablet 2  . albuterol (PROVENTIL HFA;VENTOLIN HFA) 108 (90 Base) MCG/ACT inhaler Inhale 2 puffs into the lungs every 6 (six) hours as needed for wheezing or shortness of breath. 1 Inhaler 2  . glipiZIDE (GLUCOTROL) 10 MG tablet Take 1 tablet (10 mg total) by mouth 2 (two) times daily  before a meal. 60 tablet 5  . Insulin Glargine (LANTUS) 100 UNIT/ML Solostar Pen Inject 50 Units into the skin daily at 10 pm. 5 pen 5  . isosorbide mononitrate (IMDUR) 30 MG 24 hr tablet TAKE 1 TABLET (30 MG TOTAL) BY MOUTH DAILY. 30 tablet 0  . spironolactone (ALDACTONE) 25 MG tablet Take 1 tablet (25 mg total) by mouth daily. 30 tablet 0  . traZODone (DESYREL) 100 MG tablet Take 1 tablet (100 mg total) by mouth at bedtime as needed for sleep. 30 tablet 3  . ergocalciferol (DRISDOL) 50000 units capsule Take 1 capsule (50,000 Units total) by mouth once a week. (Patient not taking: Reported on 11/25/2017) 4 capsule 1   No facility-administered medications prior to visit.     ROS Review of Systems  Constitutional: Negative for activity change and appetite change.  HENT: Negative for sinus pressure and sore throat.   Eyes: Negative for visual disturbance.  Respiratory: Negative for cough, chest tightness and shortness of breath.  Cardiovascular: Negative for chest pain and leg swelling.  Gastrointestinal: Negative for abdominal distention, abdominal pain, constipation and diarrhea.  Endocrine: Negative.   Genitourinary: Negative for dysuria.  Musculoskeletal: Negative for joint swelling and myalgias.  Skin: Negative for rash.  Allergic/Immunologic: Negative.   Neurological: Positive for light-headedness. Negative for weakness and numbness.  Psychiatric/Behavioral: Negative for dysphoric mood and suicidal ideas.    Objective:  BP 94/65   Pulse (!) 114   Temp 97.9 F (36.6 C) (Oral)   Ht 5\' 5"  (1.651 m)   Wt 237 lb (107.5 kg)   SpO2 96%   BMI 39.44 kg/m   BP/Weight 11/25/2017 11/14/2017 09/30/2017  Systolic BP 94 108 130  Diastolic BP 65 62 72  Wt. (Lbs) 237 240 250.4  BMI 39.44 39.94 41.67    Wt Readings from Last 3 Encounters:  11/25/17 237 lb (107.5 kg)  11/14/17 240 lb (108.9 kg)  09/30/17 250 lb 6.4 oz (113.6 kg)     Physical Exam  Constitutional: He is oriented to  person, place, and time. He appears well-developed and well-nourished.  Cardiovascular: Normal heart sounds and intact distal pulses. A regularly irregular rhythm present. Tachycardia present.  No murmur heard. Pulmonary/Chest: Effort normal and breath sounds normal. He has no wheezes. He has no rales. He exhibits no tenderness.  Abdominal: Soft. Bowel sounds are normal. He exhibits no distension and no mass. There is no tenderness.  Musculoskeletal: Normal range of motion.  Neurological: He is alert and oriented to person, place, and time.  Skin: Skin is warm and dry.  Psychiatric: He has a normal mood and affect.    CMP Latest Ref Rng & Units 10/08/2017 09/30/2017 09/13/2017  Glucose 70 - 99 mg/dL 960(A) 540(J) 811(B)  BUN 6 - 20 mg/dL 14(N) 82(N) 56(O)  Creatinine 0.61 - 1.24 mg/dL 1.30(Q) 6.57(Q) 4.69(G)  Sodium 135 - 145 mmol/L 130(L) 135 135  Potassium 3.5 - 5.1 mmol/L 3.6 2.9(L) 3.0(L)  Chloride 98 - 111 mmol/L 88(L) 94(L) 99(L)  CO2 22 - 32 mmol/L 25 31 28   Calcium 8.9 - 10.3 mg/dL 8.0(L) 8.3(L) 7.8(L)  Total Protein 6.5 - 8.1 g/dL - - 7.3  Total Bilirubin 0.3 - 1.2 mg/dL - - 0.6  Alkaline Phos 38 - 126 U/L - - 154(H)  AST 15 - 41 U/L - - 24  ALT 17 - 63 U/L - - 18    Lipid Panel     Component Value Date/Time   CHOL 120 11/11/2016 1107   TRIG 102 11/11/2016 1107   HDL 33 (L) 11/11/2016 1107   CHOLHDL 3.6 11/11/2016 1107   CHOLHDL 4.4 08/05/2014 0317   VLDL 10 08/05/2014 0317   LDLCALC 67 11/11/2016 1107    Lab Results  Component Value Date   HGBA1C 6.9 11/25/2017     Assessment & Plan:   1. Type 2 diabetes mellitus with complication, unspecified whether long term insulin use (HCC) Significantly improved with A1c of 6.9 Continue current regimen Counseled on Diabetic diet, my plate method, 295 minutes of moderate intensity exercise/week Keep blood sugar logs with fasting goals of 80-120 mg/dl, random of less than 284 and in the event of sugars less than 60 mg/dl  or greater than 132 mg/dl please notify the clinic ASAP. It is recommended that you undergo annual eye exams and annual foot exams. Pneumonia vaccine is recommended. - POCT glycosylated hemoglobin (Hb A1C) - POCT glucose (manual entry) - Lipid panel; Future  2. Dyspnea He has been out of  his inhalers which I have refilled PFTs previously ordered to evaluate for emphysema  which he never underwent Advised to quit smoking - albuterol (PROVENTIL HFA;VENTOLIN HFA) 108 (90 Base) MCG/ACT inhaler; Inhale 2 puffs into the lungs every 6 (six) hours as needed for wheezing or shortness of breath.  Dispense: 1 Inhaler; Refill: 2  3. Other insomnia Stable - traZODone (DESYREL) 100 MG tablet; Take 1 tablet (100 mg total) by mouth at bedtime as needed for sleep.  Dispense: 30 tablet; Refill: 3  4. Cardiomyopathy, ischemic EF 20 to 25% from 08/2017 Euvolemic Continue current regimen Follow-up with cardiology - spironolactone (ALDACTONE) 25 MG tablet; Take 0.5 tablets (12.5 mg total) by mouth daily.  Dispense: 30 tablet; Refill: 3 - isosorbide mononitrate (IMDUR) 30 MG 24 hr tablet; Take 1 tablet (30 mg total) by mouth daily.  Dispense: 30 tablet; Refill: 3  5. Other diabetic neurological complication associated with type 2 diabetes mellitus (HCC) Controlled - Insulin Glargine (LANTUS) 100 UNIT/ML Solostar Pen; Inject 50 Units into the skin daily at 10 pm.  Dispense: 5 pen; Refill: 5  6. Type 2 diabetes mellitus with other circulatory complication, with long-term current use of insulin (HCC) Absence of claudication pains at this time - Insulin Glargine (LANTUS) 100 UNIT/ML Solostar Pen; Inject 50 Units into the skin daily at 10 pm.  Dispense: 5 pen; Refill: 5 - glipiZIDE (GLUCOTROL) 10 MG tablet; Take 1 tablet (10 mg total) by mouth 2 (two) times daily before a meal.  Dispense: 60 tablet; Refill: 5  7. Dizziness Likely from hypotension I have decreased his dose of spironolactone We will also add  on Zyrtec in the event that this is sinus related Continue meclizine for vertigo  8. Atrial fibrillation with rapid ventricular response (HCC) Currently in A. fib On anticoagulation with Xarelto   Meds ordered this encounter  Medications  . albuterol (PROVENTIL HFA;VENTOLIN HFA) 108 (90 Base) MCG/ACT inhaler    Sig: Inhale 2 puffs into the lungs every 6 (six) hours as needed for wheezing or shortness of breath.    Dispense:  1 Inhaler    Refill:  2  . cetirizine (ZYRTEC) 10 MG tablet    Sig: Take 1 tablet (10 mg total) by mouth daily.    Dispense:  30 tablet    Refill:  1  . spironolactone (ALDACTONE) 25 MG tablet    Sig: Take 0.5 tablets (12.5 mg total) by mouth daily.    Dispense:  30 tablet    Refill:  3    Decrease previous dose  . traZODone (DESYREL) 100 MG tablet    Sig: Take 1 tablet (100 mg total) by mouth at bedtime as needed for sleep.    Dispense:  30 tablet    Refill:  3  . isosorbide mononitrate (IMDUR) 30 MG 24 hr tablet    Sig: Take 1 tablet (30 mg total) by mouth daily.    Dispense:  30 tablet    Refill:  3  . Insulin Glargine (LANTUS) 100 UNIT/ML Solostar Pen    Sig: Inject 50 Units into the skin daily at 10 pm.    Dispense:  5 pen    Refill:  5    Discontinue previous dose  . glipiZIDE (GLUCOTROL) 10 MG tablet    Sig: Take 1 tablet (10 mg total) by mouth 2 (two) times daily before a meal.    Dispense:  60 tablet    Refill:  5    Follow-up: Return in about 3  months (around 02/24/2018) for follow up of chronic medical conditions.   Hoy Register MD

## 2017-11-25 NOTE — Telephone Encounter (Signed)
Call placed to the patient and he said that he does need a ride to his appointment at [facility] today.  Address in Epic confirmed  [facility] Office Team Lead notified of need for cab

## 2017-11-25 NOTE — Telephone Encounter (Signed)
Met with the patient when he was in the clinic today.   He was interested in options for transportation. Provided him with a SCAT application but he said that he would complete it tomorrow.  He noted that a friend drive him to the clinic today and he did not need the cab service. He is also eligible for medicaid transportation.   He said that he continues to have PCS 2 hours x 7days/week.  He also noted that his rent has been increased to $595/month and has difficulty paying the rent/ He said that he has been to social security and has submitted all applications as instructed [employer] [facility] [place] but has not heard anything from social security. Informed him that he needs to contact Abelino Derrick, [employer] [facility] [place] # [phone]. He tried to call when in the office but was not able to reach her.  This CM then attempted to contact her and was able to reach her and explain that the patient has not yet heard from social security.  She stated that he could call her or come to see her at the office - [address] - [address]th floor, [place], [place].  [facility], anytime on 11/28/17 and she would be able to meet with him. She would like him to bring any papers that he has received from social security.   This information was shared with the patient and the address of the Legal Aid office was also provided.  He said the he just left her a message but would also plan to see her 11/28/17.

## 2017-11-25 NOTE — Progress Notes (Signed)
Patient is feeling dizzy today.

## 2017-12-03 ENCOUNTER — Telehealth: Payer: Self-pay

## 2017-12-03 NOTE — Telephone Encounter (Signed)
As per Ranee Gosselin, [employer] [facility] [place], the SSI application is still pending.

## 2017-12-05 ENCOUNTER — Telehealth (HOSPITAL_COMMUNITY): Payer: Self-pay

## 2017-12-05 NOTE — Telephone Encounter (Signed)
I called pt to confirm [facility] visit; no answer on the phone therefore message left.

## 2017-12-09 ENCOUNTER — Other Ambulatory Visit (HOSPITAL_COMMUNITY): Payer: Self-pay

## 2017-12-09 ENCOUNTER — Telehealth (HOSPITAL_COMMUNITY): Payer: Self-pay

## 2017-12-09 NOTE — Progress Notes (Signed)
[facility]P visit to pt's house @ 3 oclock (appointment time); pt is not home and states that he will be home around 330.

## 2017-12-09 NOTE — Telephone Encounter (Signed)
Pt called to schedule [facility] visit; he agrees to 3pm visit for today.

## 2017-12-10 ENCOUNTER — Telehealth (HOSPITAL_COMMUNITY): Payer: Self-pay

## 2017-12-10 NOTE — Telephone Encounter (Signed)
I called pt to see if he's available for a [facility]P visit today. He stated that he wasn't home and that he will call me back when he goes home.  Today's attempt is due to pt not being home yesterday (regular scheduled day).

## 2017-12-16 ENCOUNTER — Other Ambulatory Visit (HOSPITAL_COMMUNITY): Payer: Self-pay

## 2017-12-16 ENCOUNTER — Telehealth (HOSPITAL_COMMUNITY): Payer: Self-pay

## 2017-12-16 NOTE — Progress Notes (Signed)
Paramedicine Encounter    Patient ID: Jacob [name], male    DOB: May 24, 1955, 62 y.o.   MRN: [id number]    Patient Care Team: Hoy Register, MD as PCP - General (Family Medicine) Clarisa Schools, RN as Registered Nurse Jena Gauss, Gerrit Friends, MD as Consulting Physician (Gastroenterology) Pleasant, Dennard Schaumann, RN as [facility] Care Management  Patient Active Problem List   Diagnosis Date Noted  . Hypokalemia   . Vitamin D deficiency 07/07/2017  . Exertional angina (HCC)   . Chronic systolic heart failure (HCC) 12/17/2016  . Hyperglycemia 12/02/2016  . Osteoarthritis of right knee 12/02/2016  . Congestive heart disease (HCC) 01/01/2016  . Diabetic neuropathy (HCC) 11/28/2015  . Ascites 11/09/2015  . CHF (congestive heart failure) (HCC) 11/09/2015  . Insomnia 04/18/2015  . Claudication of right lower extremity (HCC) 04/07/2015  . Cardiomyopathy, ischemic 04/07/2015  . Chronic anticoagulation 03/30/2015  . Cardiorenal syndrome with renal failure   . SOB (shortness of breath)   . Morbid obesity due to excess calories (HCC)   . Helicobacter pylori ab+ 12/12/2014  . Calf pain   . Duodenal ulcer with hemorrhage   . Hemorrhagic shock (HCC)   . Cardiogenic shock (HCC)   . Diabetes mellitus (HCC)   . Coronary artery disease involving native coronary artery of native heart without angina pectoris   . Atrial fibrillation with rapid ventricular response (HCC) 10/29/2014  . Chest pain   . Atherosclerosis of native arteries of extremity with intermittent claudication (HCC) 09/06/2014  . Atrial fibrillation with controlled ventricular response (HCC) 08/03/2014  . Bacteremia   . DM (diabetes mellitus), type 2 with peripheral vascular complications (HCC) 08/01/2014  . Anemia of chronic disease 08/01/2014  . PVD (peripheral vascular disease) (HCC) 10/29/2013  . Tobacco abuse 05/13/2013  . AF (atrial fibrillation) (HCC) 02/11/2013  . ICD - in place- BS May 2014 [facility] 02/11/2013  .  Microcytic anemia 01/11/2013  . 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 12/04/2011  . Noncompliance 12/01/2011  . Essential hypertension 10/30/2006    Current Outpatient Medications:  .  ACCU-CHEK SOFTCLIX LANCETS lancets, Use for once daily testing of blood sugar, Disp: 100 each, Rfl: 12 .  acetaminophen-codeine (TYLENOL #3) 300-30 MG tablet, TAKE 1 TABLET BY MOUTH AT BEDTIME AS NEEDED FOR MODERATE PAIN., Disp: 30 tablet, Rfl: 0 .  albuterol (PROVENTIL HFA;VENTOLIN HFA) 108 (90 Base) MCG/ACT inhaler, Inhale 2 puffs into the lungs every 6 (six) hours as needed for wheezing or shortness of breath., Disp: 1 Inhaler, Rfl: 2 .  albuterol (PROVENTIL) (2.5 MG/3ML) 0.083% nebulizer solution, Take 3 mLs (2.5 mg total) by nebulization every 6 (six) hours as needed for wheezing or shortness of breath., Disp: 150 mL, Rfl: 1 .  allopurinol (ZYLOPRIM) 100 MG tablet, TAKE 2 TABLETS (200 MG TOTAL) BY MOUTH EVERY MORNING, Disp: 60 tablet, Rfl: 3 .  atorvastatin (LIPITOR) 40 MG tablet, take 1 TABLET BY MOUTH EVERY EVENING at 6pm, Disp: 30 tablet, Rfl: 6 .  Blood Glucose Monitoring Suppl (ACCU-CHEK AVIVA) device, Use as instructed1 times daily before meals, Disp: 1 each, Rfl: 0 .  cetirizine (ZYRTEC) 10 MG tablet, Take 1 tablet (10 mg total) by mouth daily., Disp: 30 tablet, Rfl: 1 .  diclofenac sodium (VOLTAREN) 1 % GEL, APPLY 4 GRAMS TOPICALLY 4 (FOUR) TIMES DAILY, Disp: 100 g, Rfl: 0 .  digoxin (LANOXIN) 0.125 MG tablet, TAKE 1/2 TABLET BY MOUTH DAILY, Disp: 14 tablet, Rfl: 3 .  ergocalciferol (DRISDOL) 50000 units capsule,  Take 1 capsule (50,000 Units total) by mouth once a week. (Patient not taking: Reported on 11/25/2017), Disp: 4 capsule, Rfl: 1 .  gabapentin (NEURONTIN) 300 MG capsule, TAKE 1 CAPSULE (300 MG TOTAL) BY MOUTH DAILY., Disp: 30 capsule, Rfl: 3 .  glipiZIDE (GLUCOTROL) 10 MG tablet, Take 1 tablet (10 mg total) by mouth 2 (two) times daily before a meal., Disp: 60 tablet, Rfl: 5 .  glucose blood  (ACCU-CHEK AVIVA) test strip, Use as instructed for 1 times daily testing of blood sugar, Disp: 100 each, Rfl: 12 .  hydrALAZINE (APRESOLINE) 25 MG tablet, TAKE 1 TABLET BY MOUTH THREE TIMES DAILY ( EVERY MORNING, NOON,EVENING ), Disp: 90 tablet, Rfl: 3 .  Insulin Glargine (LANTUS) 100 UNIT/ML Solostar Pen, Inject 50 Units into the skin daily at 10 pm., Disp: 5 pen, Rfl: 5 .  isosorbide mononitrate (IMDUR) 30 MG 24 hr tablet, Take 1 tablet (30 mg total) by mouth daily., Disp: 30 tablet, Rfl: 3 .  Lancet Devices (ACCU-CHEK SOFTCLIX) lancets, Use as instructed for once daily testing of blood sugar, Disp: 1 each, Rfl: 0 .  meclizine (ANTIVERT) 25 MG tablet, TAKE 1 TABLET (25 MG TOTAL) BY MOUTH 3 (THREE) TIMES DAILY AS NEEDED FOR DIZZINESS., Disp: 60 tablet, Rfl: 1 .  metolazone (ZAROXOLYN) 2.5 MG tablet, Take 1 tablet once as directed by CHF clinic., Disp: 5 tablet, Rfl: 0 .  potassium chloride SA (K-DUR,KLOR-CON) 20 MEQ tablet, Take 2 tablets (40 mEq total) by mouth daily as needed. TAKE ONLY ON DAYS YOU TAKE METOLAZONE!!, Disp: 60 tablet, Rfl: 11 .  spironolactone (ALDACTONE) 25 MG tablet, Take 0.5 tablets (12.5 mg total) by mouth daily., Disp: 30 tablet, Rfl: 3 .  torsemide (DEMADEX) 20 MG tablet, TAKE 4 TABLETS (80 MG TOTAL) BY MOUTH 2 (TWO) TIMES DAILY., Disp: 240 tablet, Rfl: 3 .  traZODone (DESYREL) 100 MG tablet, Take 1 tablet (100 mg total) by mouth at bedtime as needed for sleep., Disp: 30 tablet, Rfl: 3 .  XARELTO 20 MG TABS tablet, TAKE ONE TABLET BY MOUTH ONCE DAILY, Disp: 30 tablet, Rfl: 2 Allergies  Allergen Reactions  . Pork-Derived Products Other (See Comments)    Religious preference     Social History   Socioeconomic History  . Marital status: Divorced    Spouse name: Not on file  . Number of children: 3  . Years of education: Not on file  . Highest education level: Not on file  Occupational History  . Occupation: disabled  Social Needs  . Financial resource strain: Not  on file  . Food insecurity:    Worry: Not on file    Inability: Not on file  . Transportation needs:    Medical: Not on file    Non-medical: Not on file  Tobacco Use  . Smoking status: Current Every Day Smoker    Packs/day: 0.50    Years: 40.00    Pack years: 20.00    Types: Cigarettes  . Smokeless tobacco: Never Used  Substance and Sexual Activity  . Alcohol use: No    Alcohol/week: 0.0 standard drinks  . Drug use: No  . Sexual activity: Never  Lifestyle  . Physical activity:    Days per week: Not on file    Minutes per session: Not on file  . Stress: Not on file  Relationships  . Social connections:    Talks on phone: Not on file    Gets together: Not on file    Attends religious  service: Not on file    Active member of club or organization: Not on file    Attends meetings of clubs or organizations: Not on file    Relationship status: Not on file  . Intimate partner violence:    Fear of current or ex partner: Not on file    Emotionally abused: Not on file    Physically abused: Not on file    Forced sexual activity: Not on file  Other Topics Concern  . Not on file  Social History Narrative   Has an apartment with a roommate. Jacob [name] was living on the streets in Jan 28, 2013.  Jacob [name] reports that his father died in [place] in January 28, 2013.  Jacob [name] is divorced.  Jacob [name] is no longer estranged from his son, but still from his daughter who lives locally.  Neither of his parents, nor any siblings have any history of CAD.    Physical Exam  Pulmonary/Chest: Effort normal. No respiratory distress. Jacob [name] has no wheezes. Jacob [name] has no rales.  Abdominal: Soft. Jacob [name] exhibits no distension. There is no guarding.  Musculoskeletal: Jacob [name] exhibits no edema.  Neurological: Jacob [name] is alert.  Skin: Skin is warm and dry. Jacob [name] is not diaphoretic.        Future Appointments  Date Time Provider Department Center  12/23/2017  3:30 PM Hoy Register, MD [facility] None     BP 104/70 (BP Location: Right Arm, Patient  Position: Standing, Cuff Size: Normal)   Pulse (!) 103   Resp (!) 97   Wt 229 lb 9.6 oz (104.1 kg)   SpO2 (!) 12%   BMI 38.21 kg/m    ATF pt CAO x4 sitting on the side of the bed c/o dizziness.  Pt stated that Jacob [name] had fallen about 20 mins prior to my arrival.  Jacob [name] stated that his home aide assisted him up from the floor.  Pt denies pain from the fall, Jacob [name] stated that Jacob [name] fell due to being dizzy.  Pt denies sob and chest pain.    Pt's vitals noted; pt's vitals within normal limits (including his HR which was normal for him); no ortho static change when Jacob [name] stood up.  Pt stated that Jacob [name] had taken his medications about 1 hour ago.  After looking throughout pt's house I found 3 full bubble pill packs all for this month.  Pt had admitted to not taking his medications besides "maybe twice a week".  Jacob [name] had no reason for not being compliant with his meds.    I called heart failure clinic and spoke with [facility].  I explained to her pt's symptoms and that Jacob [name] took his morning meds this am which was the first time in a while.  She advised pt to take meclizine for the dizziness. Pt and I discussed him taking his medications, the possible cause of his dizziness and scheduled our next week visit.  Pill bubble packs verified.     Medication ordered: none  Cinda Hara, EMT Paramedic [phone] 12/16/2017    ACTION: Home visit completed

## 2017-12-16 NOTE — Telephone Encounter (Signed)
Pt called to confirm today [facility] visit

## 2017-12-17 ENCOUNTER — Telehealth: Payer: Self-pay

## 2017-12-17 NOTE — Telephone Encounter (Signed)
As per Ranee Gosselin, [employer] [place]< they are still working with the patient.  His SSI is pending.

## 2017-12-22 ENCOUNTER — Telehealth (HOSPITAL_COMMUNITY): Payer: Self-pay | Admitting: *Deleted

## 2017-12-22 NOTE — Telephone Encounter (Signed)
Mailed records to disability as requested.

## 2017-12-23 ENCOUNTER — Encounter

## 2017-12-23 ENCOUNTER — Ambulatory Visit: Payer: Medicare Other | Admitting: Family Medicine

## 2017-12-29 ENCOUNTER — Telehealth: Payer: Self-pay

## 2017-12-29 NOTE — Telephone Encounter (Signed)
Message received that patient had called. Call returned to # [phone] (M) and message left requesting a call back to this CM # [phone]

## 2018-01-01 ENCOUNTER — Other Ambulatory Visit (HOSPITAL_COMMUNITY): Payer: Self-pay

## 2018-01-01 NOTE — Progress Notes (Signed)
I picked up pt' meds from [facility]/ I tried dropping the meds off to pt at his house. Pt was not home/i'll f/u with pt.

## 2018-01-02 DIAGNOSIS — H401132 Primary open-angle glaucoma, bilateral, moderate stage: Secondary | ICD-10-CM | POA: Diagnosis not present

## 2018-01-08 ENCOUNTER — Telehealth (HOSPITAL_COMMUNITY): Payer: Self-pay

## 2018-01-08 ENCOUNTER — Other Ambulatory Visit (HOSPITAL_COMMUNITY): Payer: Self-pay

## 2018-01-08 NOTE — Progress Notes (Signed)
Paramedicine Encounter    Patient ID: Jacob [name], male    DOB: [birth year], 62 y.o.   MRN: [id number]    Patient Care Team: Hoy Register, MD as PCP - General (Family Medicine) Clarisa Schools, RN as Registered Nurse Jena Gauss, Gerrit Friends, MD as Consulting Physician (Gastroenterology) Pleasant, Dennard Schaumann, RN as [facility] Care Management  Patient Active Problem List   Diagnosis Date Noted  . Hypokalemia   . Vitamin D deficiency 07/07/2017  . Exertional angina (HCC)   . Chronic systolic heart failure (HCC) 12/17/2016  . Hyperglycemia 12/02/2016  . Osteoarthritis of right knee 12/02/2016  . Congestive heart disease (HCC) 01/01/2016  . Diabetic neuropathy (HCC) 11/28/2015  . Ascites 11/09/2015  . CHF (congestive heart failure) (HCC) 11/09/2015  . Insomnia 04/18/2015  . Claudication of right lower extremity (HCC) 04/07/2015  . Cardiomyopathy, ischemic 04/07/2015  . Chronic anticoagulation 03/30/2015  . Cardiorenal syndrome with renal failure   . SOB (shortness of breath)   . Morbid obesity due to excess calories (HCC)   . Helicobacter pylori ab+ 12/12/2014  . Calf pain   . Duodenal ulcer with hemorrhage   . Hemorrhagic shock (HCC)   . Cardiogenic shock (HCC)   . Diabetes mellitus (HCC)   . Coronary artery disease involving native coronary artery of native heart without angina pectoris   . Atrial fibrillation with rapid ventricular response (HCC) 10/29/2014  . Chest pain   . Atherosclerosis of native arteries of extremity with intermittent claudication (HCC) 09/06/2014  . Atrial fibrillation with controlled ventricular response (HCC) 08/03/2014  . Bacteremia   . DM (diabetes mellitus), type 2 with peripheral vascular complications (HCC) 08/01/2014  . Anemia of chronic disease 08/01/2014  . PVD (peripheral vascular disease) (HCC) 10/29/2013  . Tobacco abuse 05/13/2013  . AF (atrial fibrillation) (HCC) 02/11/2013  . ICD - in place- BS May 2014 [facility] 02/11/2013  .  Microcytic anemia 01/11/2013  . 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 Noncompliance 12/01/2011  . Essential hypertension 10/30/2006    Current Outpatient Medications:  .  ACCU-CHEK SOFTCLIX LANCETS lancets, Use for once daily testing of blood sugar, Disp: 100 each, Rfl: 12 .  acetaminophen-codeine (TYLENOL #3) 300-30 MG tablet, TAKE 1 TABLET BY MOUTH AT BEDTIME AS NEEDED FOR MODERATE PAIN., Disp: 30 tablet, Rfl: 0 .  albuterol (PROVENTIL HFA;VENTOLIN HFA) 108 (90 Base) MCG/ACT inhaler, Inhale 2 puffs into the lungs every 6 (six) hours as needed for wheezing or shortness of breath., Disp: 1 Inhaler, Rfl: 2 .  albuterol (PROVENTIL) (2.5 MG/3ML) 0.083% nebulizer solution, Take 3 mLs (2.5 mg total) by nebulization every 6 (six) hours as needed for wheezing or shortness of breath., Disp: 150 mL, Rfl: 1 .  allopurinol (ZYLOPRIM) 100 MG tablet, TAKE 2 TABLETS (200 MG TOTAL) BY MOUTH EVERY MORNING, Disp: 60 tablet, Rfl: 3 .  atorvastatin (LIPITOR) 40 MG tablet, take 1 TABLET BY MOUTH EVERY EVENING at 6pm, Disp: 30 tablet, Rfl: 6 .  Blood Glucose Monitoring Suppl (ACCU-CHEK AVIVA) device, Use as instructed1 times daily before meals, Disp: 1 each, Rfl: 0 .  cetirizine (ZYRTEC) 10 MG tablet, Take 1 tablet (10 mg total) by mouth daily., Disp: 30 tablet, Rfl: 1 .  diclofenac sodium (VOLTAREN) 1 % GEL, APPLY 4 GRAMS TOPICALLY 4 (FOUR) TIMES DAILY, Disp: 100 g, Rfl: 0 .  digoxin (LANOXIN) 0.125 MG tablet, TAKE 1/2 TABLET BY MOUTH DAILY, Disp: 14 tablet, Rfl: 3 .  ergocalciferol (DRISDOL) 50000 units capsule,  Take 1 capsule (50,000 Units total) by mouth once a week. (Patient not taking: Reported on 11/25/2017), Disp: 4 capsule, Rfl: 1 .  gabapentin (NEURONTIN) 300 MG capsule, TAKE 1 CAPSULE (300 MG TOTAL) BY MOUTH DAILY., Disp: 30 capsule, Rfl: 3 .  glipiZIDE (GLUCOTROL) 10 MG tablet, Take 1 tablet (10 mg total) by mouth 2 (two) times daily before a meal., Disp: 60 tablet, Rfl: 5 .  glucose blood  (ACCU-CHEK AVIVA) test strip, Use as instructed for 1 times daily testing of blood sugar, Disp: 100 each, Rfl: 12 .  hydrALAZINE (APRESOLINE) 25 MG tablet, TAKE 1 TABLET BY MOUTH THREE TIMES DAILY ( EVERY MORNING, NOON,EVENING ), Disp: 90 tablet, Rfl: 3 .  Insulin Glargine (LANTUS) 100 UNIT/ML Solostar Pen, Inject 50 Units into the skin daily at 10 pm., Disp: 5 pen, Rfl: 5 .  isosorbide mononitrate (IMDUR) 30 MG 24 hr tablet, Take 1 tablet (30 mg total) by mouth daily., Disp: 30 tablet, Rfl: 3 .  Lancet Devices (ACCU-CHEK SOFTCLIX) lancets, Use as instructed for once daily testing of blood sugar, Disp: 1 each, Rfl: 0 .  meclizine (ANTIVERT) 25 MG tablet, TAKE 1 TABLET (25 MG TOTAL) BY MOUTH 3 (THREE) TIMES DAILY AS NEEDED FOR DIZZINESS., Disp: 60 tablet, Rfl: 1 .  metolazone (ZAROXOLYN) 2.5 MG tablet, Take 1 tablet once as directed by CHF clinic., Disp: 5 tablet, Rfl: 0 .  potassium chloride SA (K-DUR,KLOR-CON) 20 MEQ tablet, Take 2 tablets (40 mEq total) by mouth daily as needed. TAKE ONLY ON DAYS YOU TAKE METOLAZONE!!, Disp: 60 tablet, Rfl: 11 .  spironolactone (ALDACTONE) 25 MG tablet, Take 0.5 tablets (12.5 mg total) by mouth daily., Disp: 30 tablet, Rfl: 3 .  torsemide (DEMADEX) 20 MG tablet, TAKE 4 TABLETS (80 MG TOTAL) BY MOUTH 2 (TWO) TIMES DAILY., Disp: 240 tablet, Rfl: 3 .  traZODone (DESYREL) 100 MG tablet, Take 1 tablet (100 mg total) by mouth at bedtime as needed for sleep., Disp: 30 tablet, Rfl: 3 .  XARELTO 20 MG TABS tablet, TAKE ONE TABLET BY MOUTH ONCE DAILY, Disp: 30 tablet, Rfl: 2 Allergies  Allergen Reactions  . Pork-Derived Products Other (See Comments)    Religious preference     Social History   Socioeconomic History  . Marital status: Divorced    Spouse name: Not on file  . Number of children: 3  . Years of education: Not on file  . Highest education level: Not on file  Occupational History  . Occupation: disabled  Social Needs  . Financial resource strain: Not  on file  . Food insecurity:    Worry: Not on file    Inability: Not on file  . Transportation needs:    Medical: Not on file    Non-medical: Not on file  Tobacco Use  . Smoking status: Current Every Day Smoker    Packs/day: 0.50    Years: 40.00    Pack years: 20.00    Types: Cigarettes  . Smokeless tobacco: Never Used  Substance and Sexual Activity  . Alcohol use: No    Alcohol/week: 0.0 standard drinks  . Drug use: No  . Sexual activity: Never  Lifestyle  . Physical activity:    Days per week: Not on file    Minutes per session: Not on file  . Stress: Not on file  Relationships  . Social connections:    Talks on phone: Not on file    Gets together: Not on file    Attends religious  service: Not on file    Active member of club or organization: Not on file    Attends meetings of clubs or organizations: Not on file    Relationship status: Not on file  . Intimate partner violence:    Fear of current or ex partner: Not on file    Emotionally abused: Not on file    Physically abused: Not on file    Forced sexual activity: Not on file  Other Topics Concern  . Not on file  Social History Narrative   Has an apartment with a roommate. He was living on the streets in 16-Jan-2013.  He reports that his father died in [place] in 01/16/2013.  He is divorced.  He is no longer estranged from his son, but still from his daughter who lives locally.  Neither of his parents, nor any siblings have any history of CAD.    Physical Exam  Constitutional: He appears well-developed.  HENT:  Head: Normocephalic.  Eyes: Pupils are equal, round, and reactive to light.  Neck: Normal range of motion. Neck supple.  Cardiovascular: Normal rate and regular rhythm.  Pulmonary/Chest: Effort normal and breath sounds normal. No respiratory distress.  Abdominal: Soft.  Musculoskeletal: Normal range of motion. He exhibits edema.  Bilateral lower leg edema.   Neurological: He is alert.  Skin: Skin is  warm and dry. Capillary refill takes 2 to 3 seconds.  Psychiatric: He has a normal mood and affect.        Future Appointments  Date Time Provider Department Center  02/18/2018  3:50 PM Hoy Register, MD [facility] None     BP 110/68 (BP Location: Right Arm, Patient Position: Sitting, Cuff Size: Normal)   Pulse 99   Resp 16   Wt 230 lb 12.8 oz (104.7 kg)   SpO2 92%   BMI 38.41 kg/m  CBG 80  Weight yesterday-didn't weigh Last visit weight-229  ATF pt sitting on the edge of the bed alert and oriented. In no respiratory distress with no complaints. Patient had extremity edema noted on both lower legs. Patient stated he took a metolazone one day last week without calling heart failure clinic or [facility] to discuss this before taking the medication. Patient stated he is out of Metolazone and is requesting more. Patient was informed he needs to be sure sure to call the clinic or [facility]P prior to taking that medication. Patient admitted he has not been weighing himself. Patient weighed in [facility] presence today. Patient requested appointment with Clinic and the patient also needs appointment for lab work. Pt agreed to make appointment. Patient denied shortness of breath, pain or difficulty sleeping. Pt's home health aid was on scene and stated she comes 7 days a week to assist the patient. Pt's medication bubble packs were delivered for month. Visit complete.  Heart failure clinic notified to refill metolazone for patient.   Medication ordered Metolazone  Arcadio Cope, EMT Paramedic [phone] 01/08/2018    ACTION: Home visit completed Next visit planned for 1 weelk

## 2018-01-08 NOTE — Telephone Encounter (Signed)
Confirmed appointment with Mr. Buffone for today.

## 2018-01-09 ENCOUNTER — Encounter: Payer: Self-pay | Admitting: Cardiology

## 2018-01-20 ENCOUNTER — Telehealth (HOSPITAL_COMMUNITY): Payer: Self-pay

## 2018-01-20 ENCOUNTER — Other Ambulatory Visit (HOSPITAL_COMMUNITY): Payer: Self-pay

## 2018-01-20 DIAGNOSIS — I255 Ischemic cardiomyopathy: Secondary | ICD-10-CM

## 2018-01-20 MED ORDER — METOLAZONE 2.5 MG PO TABS: ORAL_TABLET | ORAL | 0 refills | Status: DC

## 2018-01-20 NOTE — Telephone Encounter (Signed)
Pt can take metolazone x 2 days. Needs BMET later this week. Ideally AFTER he has taken both doses metolazone.    Casimiro Needle [id number] [place], PA-C 01/20/2018 10:44 AM

## 2018-01-20 NOTE — Telephone Encounter (Signed)
Dee (paramedicine) called and stated that pt's weight is 249, on 09/24 pt's weight was 229. Pt has edema in his lower legs, hands, and feet. Pt is SOB and he wants to know if he can take a metolazone? Last labs were drawn on 10/08/2017. Creatnine was 1.44, Bun 40. Please advise.

## 2018-01-20 NOTE — Telephone Encounter (Signed)
Pt called and stated that he think that his weight has increased.  We scheduled a [facility] visit for 1030

## 2018-01-20 NOTE — Progress Notes (Signed)
Rx prescription for metolazone picked [facility] from [facility]. Pt is not home therefore I placed the med in his mailbox. Pt was given instructions on how to take the meds per andy from the heart failure clinic.   1 metolazone today and tomorrow; lab work afterwards.  Pt repeated the instructions.  The prescription was 10 metolazone 2.5 mg.

## 2018-01-20 NOTE — Progress Notes (Signed)
Paramedicine Encounter    Patient ID: Jacob [name], male    DOB: May 24, 1955, 62 y.o.   MRN: [id number]    Patient Care Team: Hoy Register, MD as PCP - General (Family Medicine) Clarisa Schools, RN as Registered Nurse Jena Gauss, Gerrit Friends, MD as Consulting Physician (Gastroenterology) Pleasant, Dennard Schaumann, RN as [facility] Care Management  Patient Active Problem List   Diagnosis Date Noted  . Hypokalemia   . Vitamin D deficiency 07/07/2017  . Exertional angina (HCC)   . Chronic systolic heart failure (HCC) 12/17/2016  . Hyperglycemia 12/02/2016  . Osteoarthritis of right knee 12/02/2016  . Congestive heart disease (HCC) 01/01/2016  . Diabetic neuropathy (HCC) 11/28/2015  . Ascites 11/09/2015  . CHF (congestive heart failure) (HCC) 11/09/2015  . Insomnia 04/18/2015  . Claudication of right lower extremity (HCC) 04/07/2015  . Cardiomyopathy, ischemic 04/07/2015  . Chronic anticoagulation 03/30/2015  . Cardiorenal syndrome with renal failure   . SOB (shortness of breath)   . Morbid obesity due to excess calories (HCC)   . Helicobacter pylori ab+ 12/12/2014  . Calf pain   . Duodenal ulcer with hemorrhage   . Hemorrhagic shock (HCC)   . Cardiogenic shock (HCC)   . Diabetes mellitus (HCC)   . Coronary artery disease involving native coronary artery of native heart without angina pectoris   . Atrial fibrillation with rapid ventricular response (HCC) 10/29/2014  . Chest pain   . Atherosclerosis of native arteries of extremity with intermittent claudication (HCC) 09/06/2014  . Atrial fibrillation with controlled ventricular response (HCC) 08/03/2014  . Bacteremia   . DM (diabetes mellitus), type 2 with peripheral vascular complications (HCC) 08/01/2014  . Anemia of chronic disease 08/01/2014  . PVD (peripheral vascular disease) (HCC) 10/29/2013  . Tobacco abuse 05/13/2013  . AF (atrial fibrillation) (HCC) 02/11/2013  . ICD - in place- BS May 2014 [facility] 02/11/2013  .  Microcytic anemia 01/11/2013  . 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 12/04/2011  . Noncompliance 12/01/2011  . Essential hypertension 10/30/2006    Current Outpatient Medications:  .  ACCU-CHEK SOFTCLIX LANCETS lancets, Use for once daily testing of blood sugar, Disp: 100 each, Rfl: 12 .  acetaminophen-codeine (TYLENOL #3) 300-30 MG tablet, TAKE 1 TABLET BY MOUTH AT BEDTIME AS NEEDED FOR MODERATE PAIN., Disp: 30 tablet, Rfl: 0 .  albuterol (PROVENTIL HFA;VENTOLIN HFA) 108 (90 Base) MCG/ACT inhaler, Inhale 2 puffs into the lungs every 6 (six) hours as needed for wheezing or shortness of breath., Disp: 1 Inhaler, Rfl: 2 .  albuterol (PROVENTIL) (2.5 MG/3ML) 0.083% nebulizer solution, Take 3 mLs (2.5 mg total) by nebulization every 6 (six) hours as needed for wheezing or shortness of breath., Disp: 150 mL, Rfl: 1 .  allopurinol (ZYLOPRIM) 100 MG tablet, TAKE 2 TABLETS (200 MG TOTAL) BY MOUTH EVERY MORNING, Disp: 60 tablet, Rfl: 3 .  atorvastatin (LIPITOR) 40 MG tablet, take 1 TABLET BY MOUTH EVERY EVENING at 6pm, Disp: 30 tablet, Rfl: 6 .  Blood Glucose Monitoring Suppl (ACCU-CHEK AVIVA) device, Use as instructed1 times daily before meals, Disp: 1 each, Rfl: 0 .  cetirizine (ZYRTEC) 10 MG tablet, Take 1 tablet (10 mg total) by mouth daily., Disp: 30 tablet, Rfl: 1 .  diclofenac sodium (VOLTAREN) 1 % GEL, APPLY 4 GRAMS TOPICALLY 4 (FOUR) TIMES DAILY, Disp: 100 g, Rfl: 0 .  digoxin (LANOXIN) 0.125 MG tablet, TAKE 1/2 TABLET BY MOUTH DAILY, Disp: 14 tablet, Rfl: 3 .  ergocalciferol (DRISDOL) 50000 units capsule,  Take 1 capsule (50,000 Units total) by mouth once a week. (Patient not taking: Reported on 11/25/2017), Disp: 4 capsule, Rfl: 1 .  gabapentin (NEURONTIN) 300 MG capsule, TAKE 1 CAPSULE (300 MG TOTAL) BY MOUTH DAILY., Disp: 30 capsule, Rfl: 3 .  glipiZIDE (GLUCOTROL) 10 MG tablet, Take 1 tablet (10 mg total) by mouth 2 (two) times daily before a meal., Disp: 60 tablet, Rfl: 5 .  glucose blood  (ACCU-CHEK AVIVA) test strip, Use as instructed for 1 times daily testing of blood sugar, Disp: 100 each, Rfl: 12 .  hydrALAZINE (APRESOLINE) 25 MG tablet, TAKE 1 TABLET BY MOUTH THREE TIMES DAILY ( EVERY MORNING, NOON,EVENING ), Disp: 90 tablet, Rfl: 3 .  Insulin Glargine (LANTUS) 100 UNIT/ML Solostar Pen, Inject 50 Units into the skin daily at 10 pm., Disp: 5 pen, Rfl: 5 .  isosorbide mononitrate (IMDUR) 30 MG 24 hr tablet, Take 1 tablet (30 mg total) by mouth daily., Disp: 30 tablet, Rfl: 3 .  Lancet Devices (ACCU-CHEK SOFTCLIX) lancets, Use as instructed for once daily testing of blood sugar, Disp: 1 each, Rfl: 0 .  meclizine (ANTIVERT) 25 MG tablet, TAKE 1 TABLET (25 MG TOTAL) BY MOUTH 3 (THREE) TIMES DAILY AS NEEDED FOR DIZZINESS., Disp: 60 tablet, Rfl: 1 .  metolazone (ZAROXOLYN) 2.5 MG tablet, Take 1 tablet once as directed by CHF clinic., Disp: 5 tablet, Rfl: 0 .  potassium chloride SA (K-DUR,KLOR-CON) 20 MEQ tablet, Take 2 tablets (40 mEq total) by mouth daily as needed. TAKE ONLY ON DAYS YOU TAKE METOLAZONE!!, Disp: 60 tablet, Rfl: 11 .  spironolactone (ALDACTONE) 25 MG tablet, Take 0.5 tablets (12.5 mg total) by mouth daily., Disp: 30 tablet, Rfl: 3 .  torsemide (DEMADEX) 20 MG tablet, TAKE 4 TABLETS (80 MG TOTAL) BY MOUTH 2 (TWO) TIMES DAILY., Disp: 240 tablet, Rfl: 3 .  traZODone (DESYREL) 100 MG tablet, Take 1 tablet (100 mg total) by mouth at bedtime as needed for sleep., Disp: 30 tablet, Rfl: 3 .  XARELTO 20 MG TABS tablet, TAKE ONE TABLET BY MOUTH ONCE DAILY, Disp: 30 tablet, Rfl: 2 Allergies  Allergen Reactions  . Pork-Derived Products Other (See Comments)    Religious preference     Social History   Socioeconomic History  . Marital status: Divorced    Spouse name: Not on file  . Number of children: 3  . Years of education: Not on file  . Highest education level: Not on file  Occupational History  . Occupation: disabled  Social Needs  . Financial resource strain: Not  on file  . Food insecurity:    Worry: Not on file    Inability: Not on file  . Transportation needs:    Medical: Not on file    Non-medical: Not on file  Tobacco Use  . Smoking status: Current Every Day Smoker    Packs/day: 0.50    Years: 40.00    Pack years: 20.00    Types: Cigarettes  . Smokeless tobacco: Never Used  Substance and Sexual Activity  . Alcohol use: No    Alcohol/week: 0.0 standard drinks  . Drug use: No  . Sexual activity: Never  Lifestyle  . Physical activity:    Days per week: Not on file    Minutes per session: Not on file  . Stress: Not on file  Relationships  . Social connections:    Talks on phone: Not on file    Gets together: Not on file    Attends religious  service: Not on file    Active member of club or organization: Not on file    Attends meetings of clubs or organizations: Not on file    Relationship status: Not on file  . Intimate partner violence:    Fear of current or ex partner: Not on file    Emotionally abused: Not on file    Physically abused: Not on file    Forced sexual activity: Not on file  Other Topics Concern  . Not on file  Social History Narrative   Has an apartment with a roommate. He was living on the streets in 01/28/2013.  He reports that his father died in [place] in 01-28-2013.  He is divorced.  He is no longer estranged from his son, but still from his daughter who lives locally.  Neither of his parents, nor any siblings have any history of CAD.    Physical Exam      Future Appointments  Date Time Provider Department Center  02/18/2018  3:50 PM Hoy Register, MD [facility] None     There were no vitals taken for this visit.  Weight yesterday-he didn't weigh Last visit weight-230 (01/08/18)  ATF pt CAO x4 c/o sob and edema. Pt called and stated that he needed me to visit due to him gaining weight and not being able to sleep last night due to the same.  Pt stated that he has taken all of his meds however  he should be on his  Pill pack but he's on his second. Pt is scheduled to be seen twice a month.  Pt request metolazone/he's been taking it without consulting the heart failure clinic and hasn't been seen in a few months.  He agrees to heart failure staff.    According to notes from 10/17 pt requested the same. He stated that he will wait for me to contact him with recommendations.    Angela at the heart failure notified/she will contact Mardelle Matte for the same.    Medication ordered none Zarin Hagmann, EMT Paramedic [phone] 01/20/2018    ACTION: Home visit completed

## 2018-01-20 NOTE — Telephone Encounter (Signed)
Dee notified. Rx for metolazone was sent to pharmacy.

## 2018-01-23 ENCOUNTER — Telehealth (HOSPITAL_COMMUNITY): Payer: Self-pay

## 2018-01-23 NOTE — Telephone Encounter (Signed)
I called pt to f/u with him; he took metolazone earlier this week for weight gain and sob; no answer. I will f/u with him again later today.

## 2018-02-05 ENCOUNTER — Other Ambulatory Visit (HOSPITAL_COMMUNITY): Payer: Self-pay

## 2018-02-05 ENCOUNTER — Telehealth: Payer: Self-pay | Admitting: Family Medicine

## 2018-02-05 ENCOUNTER — Other Ambulatory Visit: Payer: Self-pay | Admitting: Family Medicine

## 2018-02-05 ENCOUNTER — Telehealth (HOSPITAL_COMMUNITY): Payer: Self-pay

## 2018-02-05 NOTE — Progress Notes (Signed)
Paramedicine Encounter    Patient ID: Jacob [name], male    DOB: May 24, 1955, 62 y.o.   MRN: [id number]    Patient Care Team: Hoy Register, MD as PCP - General (Family Medicine) Clarisa Schools, RN as Registered Nurse Jena Gauss, Gerrit Friends, MD as Consulting Physician (Gastroenterology) Pleasant, Dennard Schaumann, RN as [facility] Care Management  Patient Active Problem List   Diagnosis Date Noted  . Hypokalemia   . Vitamin D deficiency 07/07/2017  . Exertional angina (HCC)   . Chronic systolic heart failure (HCC) 12/17/2016  . Hyperglycemia 12/02/2016  . Osteoarthritis of right knee 12/02/2016  . Congestive heart disease (HCC) 01/01/2016  . Diabetic neuropathy (HCC) 11/28/2015  . Ascites 11/09/2015  . CHF (congestive heart failure) (HCC) 11/09/2015  . Insomnia 04/18/2015  . Claudication of right lower extremity (HCC) 04/07/2015  . Cardiomyopathy, ischemic 04/07/2015  . Chronic anticoagulation 03/30/2015  . Cardiorenal syndrome with renal failure   . SOB (shortness of breath)   . Morbid obesity due to excess calories (HCC)   . Helicobacter pylori ab+ 12/12/2014  . Calf pain   . Duodenal ulcer with hemorrhage   . Hemorrhagic shock (HCC)   . Cardiogenic shock (HCC)   . Diabetes mellitus (HCC)   . Coronary artery disease involving native coronary artery of native heart without angina pectoris   . Atrial fibrillation with rapid ventricular response (HCC) 10/29/2014  . Chest pain   . Atherosclerosis of native arteries of extremity with intermittent claudication (HCC) 09/06/2014  . Atrial fibrillation with controlled ventricular response (HCC) 08/03/2014  . Bacteremia   . DM (diabetes mellitus), type 2 with peripheral vascular complications (HCC) 08/01/2014  . Anemia of chronic disease 08/01/2014  . PVD (peripheral vascular disease) (HCC) 10/29/2013  . Tobacco abuse 05/13/2013  . AF (atrial fibrillation) (HCC) 02/11/2013  . ICD - in place- BS May 2014 [facility] 02/11/2013  .  Microcytic anemia 01/11/2013  . 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 12/04/2011  . Noncompliance 12/01/2011  . Essential hypertension 10/30/2006    Current Outpatient Medications:  .  ACCU-CHEK SOFTCLIX LANCETS lancets, Use for once daily testing of blood sugar, Disp: 100 each, Rfl: 12 .  acetaminophen-codeine (TYLENOL #3) 300-30 MG tablet, TAKE 1 TABLET BY MOUTH AT BEDTIME AS NEEDED FOR MODERATE PAIN., Disp: 30 tablet, Rfl: 0 .  albuterol (PROVENTIL HFA;VENTOLIN HFA) 108 (90 Base) MCG/ACT inhaler, Inhale 2 puffs into the lungs every 6 (six) hours as needed for wheezing or shortness of breath., Disp: 1 Inhaler, Rfl: 2 .  albuterol (PROVENTIL) (2.5 MG/3ML) 0.083% nebulizer solution, Take 3 mLs (2.5 mg total) by nebulization every 6 (six) hours as needed for wheezing or shortness of breath., Disp: 150 mL, Rfl: 1 .  allopurinol (ZYLOPRIM) 100 MG tablet, TAKE 2 TABLETS (200 MG TOTAL) BY MOUTH EVERY MORNING, Disp: 60 tablet, Rfl: 3 .  atorvastatin (LIPITOR) 40 MG tablet, take 1 TABLET BY MOUTH EVERY EVENING at 6pm, Disp: 30 tablet, Rfl: 6 .  Blood Glucose Monitoring Suppl (ACCU-CHEK AVIVA) device, Use as instructed1 times daily before meals, Disp: 1 each, Rfl: 0 .  cetirizine (ZYRTEC) 10 MG tablet, Take 1 tablet (10 mg total) by mouth daily., Disp: 30 tablet, Rfl: 1 .  diclofenac sodium (VOLTAREN) 1 % GEL, APPLY 4 GRAMS TOPICALLY 4 (FOUR) TIMES DAILY, Disp: 100 g, Rfl: 0 .  digoxin (LANOXIN) 0.125 MG tablet, TAKE 1/2 TABLET BY MOUTH DAILY, Disp: 14 tablet, Rfl: 3 .  ergocalciferol (DRISDOL) 50000 units capsule,  Take 1 capsule (50,000 Units total) by mouth once a week. (Patient not taking: Reported on 11/25/2017), Disp: 4 capsule, Rfl: 1 .  gabapentin (NEURONTIN) 300 MG capsule, TAKE 1 CAPSULE (300 MG TOTAL) BY MOUTH DAILY., Disp: 30 capsule, Rfl: 3 .  glipiZIDE (GLUCOTROL) 10 MG tablet, Take 1 tablet (10 mg total) by mouth 2 (two) times daily before a meal., Disp: 60 tablet, Rfl: 5 .  glucose blood  (ACCU-CHEK AVIVA) test strip, Use as instructed for 1 times daily testing of blood sugar, Disp: 100 each, Rfl: 12 .  hydrALAZINE (APRESOLINE) 25 MG tablet, TAKE 1 TABLET BY MOUTH THREE TIMES DAILY ( EVERY MORNING, NOON,EVENING ), Disp: 90 tablet, Rfl: 3 .  Insulin Glargine (LANTUS) 100 UNIT/ML Solostar Pen, Inject 50 Units into the skin daily at 10 pm., Disp: 5 pen, Rfl: 5 .  isosorbide mononitrate (IMDUR) 30 MG 24 hr tablet, Take 1 tablet (30 mg total) by mouth daily., Disp: 30 tablet, Rfl: 3 .  Lancet Devices (ACCU-CHEK SOFTCLIX) lancets, Use as instructed for once daily testing of blood sugar, Disp: 1 each, Rfl: 0 .  meclizine (ANTIVERT) 25 MG tablet, TAKE 1 TABLET (25 MG TOTAL) BY MOUTH 3 (THREE) TIMES DAILY AS NEEDED FOR DIZZINESS., Disp: 60 tablet, Rfl: 1 .  metolazone (ZAROXOLYN) 2.5 MG tablet, Take 1 tablet once as directed by CHF clinic., Disp: 10 tablet, Rfl: 0 .  potassium chloride SA (K-DUR,KLOR-CON) 20 MEQ tablet, Take 2 tablets (40 mEq total) by mouth daily as needed. TAKE ONLY ON DAYS YOU TAKE METOLAZONE!!, Disp: 60 tablet, Rfl: 11 .  spironolactone (ALDACTONE) 25 MG tablet, Take 0.5 tablets (12.5 mg total) by mouth daily., Disp: 30 tablet, Rfl: 3 .  torsemide (DEMADEX) 20 MG tablet, TAKE 4 TABLETS (80 MG TOTAL) BY MOUTH 2 (TWO) TIMES DAILY., Disp: 240 tablet, Rfl: 3 .  traZODone (DESYREL) 100 MG tablet, Take 1 tablet (100 mg total) by mouth at bedtime as needed for sleep., Disp: 30 tablet, Rfl: 3 .  XARELTO 20 MG TABS tablet, TAKE ONE TABLET BY MOUTH ONCE DAILY, Disp: 30 tablet, Rfl: 2 Allergies  Allergen Reactions  . Pork-Derived Products Other (See Comments)    Religious preference     Social History   Socioeconomic History  . Marital status: Divorced    Spouse name: Not on file  . Number of children: 3  . Years of education: Not on file  . Highest education level: Not on file  Occupational History  . Occupation: disabled  Social Needs  . Financial resource strain: Not  on file  . Food insecurity:    Worry: Not on file    Inability: Not on file  . Transportation needs:    Medical: Not on file    Non-medical: Not on file  Tobacco Use  . Smoking status: Current Every Day Smoker    Packs/day: 0.50    Years: 40.00    Pack years: 20.00    Types: Cigarettes  . Smokeless tobacco: Never Used  Substance and Sexual Activity  . Alcohol use: No    Alcohol/week: 0.0 standard drinks  . Drug use: No  . Sexual activity: Never  Lifestyle  . Physical activity:    Days per week: Not on file    Minutes per session: Not on file  . Stress: Not on file  Relationships  . Social connections:    Talks on phone: Not on file    Gets together: Not on file    Attends religious  service: Not on file    Active member of club or organization: Not on file    Attends meetings of clubs or organizations: Not on file    Relationship status: Not on file  . Intimate partner violence:    Fear of current or ex partner: Not on file    Emotionally abused: Not on file    Physically abused: Not on file    Forced sexual activity: Not on file  Other Topics Concern  . Not on file  Social History Narrative   Has an apartment with a roommate. He was living on the streets in Jan 25, 2013.  He reports that his father died in [place] in 2013-01-25.  He is divorced.  He is no longer estranged from his son, but still from his daughter who lives locally.  Neither of his parents, nor any siblings have any history of CAD.    Physical Exam  Constitutional: He is oriented to person, place, and time. He appears well-developed.  HENT:  Head: Normocephalic.  Eyes: Pupils are equal, round, and reactive to light.  Neck: Normal range of motion. Neck supple.  Cardiovascular: Normal rate.  irregular  Pulmonary/Chest: Effort normal and breath sounds normal. No respiratory distress.  Lung sounds diminished lower lobes/ clear upper  Abdominal: He exhibits distension. There is no tenderness.   Musculoskeletal: Normal range of motion. He exhibits no edema.  Neurological: He is alert and oriented to person, place, and time.  Skin: Skin is warm and dry. Capillary refill takes 2 to 3 seconds. He is not diaphoretic.        Future Appointments  Date Time Provider Department Center  02/18/2018  3:50 PM Hoy Register, MD [facility] None     BP 110/70 (BP Location: Right Arm, Patient Position: Sitting, Cuff Size: Normal)   Pulse 96   Wt 219 lb (99.3 kg)   SpO2 94%   BMI 36.44 kg/m  CBG 115  Weight yesterday-232 (three days ago 11/11)  Last visit weight-242 (10/29)  ATF pt alert sitting on the edge of the bed with obvious abdominal distention. Patient smoking a cigarette upon arrival. Patient's medications were reviewed and noted patient to have not been taking all appropriate medications. Patient was advised to take all of his medications properly and as prescribed. Patient's assessment and vitals were obtained. Patient denied shortness of breath, dizziness or diffculty sleeping. Patient did admit to decreased urination within the last week or so. Taking metolazone (took 4 pills within 15 days) last took two days ago without calling the heart failure clinic Told him about upcoming appointment on the 26th with [facility]. Patient was also made an  appointment made thru angela with heart falure 11/25 @2 :30. Taxi voucher requested for same. Home visit complete, next visit one week.     Medication ordered: Pill bubble packs  Camya Haydon, EMT Paramedic [phone] 02/05/2018    ACTION: Home visit completed Next visit planned for 1 week

## 2018-02-05 NOTE — Telephone Encounter (Signed)
Patient called requesting a call back, patient did not specify why. Please f/u.

## 2018-02-05 NOTE — Telephone Encounter (Signed)
Pt called to schedule CHP visit for this week/no answer/ couldn't leave message.

## 2018-02-06 ENCOUNTER — Other Ambulatory Visit (HOSPITAL_COMMUNITY): Payer: Self-pay

## 2018-02-06 NOTE — Telephone Encounter (Signed)
Call returned to patient [phone] (M)  and message was left requesting a call back to this CM # [phone].

## 2018-02-06 NOTE — Progress Notes (Signed)
Pt had only one bubble pack of medications left which is incomplete. Pharmacist only has one pack plus insulin ready for pick up. I placed the meds in pt's mailbox per his request.  F/u with pt next week.

## 2018-02-09 ENCOUNTER — Telehealth: Payer: Self-pay | Admitting: Family Medicine

## 2018-02-09 NOTE — Telephone Encounter (Signed)
Patient called and wanted to speak with you. Please follow up with patient.

## 2018-02-09 NOTE — Telephone Encounter (Signed)
Attempted to contact the patient # [phone] (M). Message left requesting a call back to this CM # [phone]

## 2018-02-11 ENCOUNTER — Telehealth: Payer: Self-pay

## 2018-02-11 NOTE — Telephone Encounter (Signed)
As per Ranee Gosselin, [employer], they are waiting for a decision regarding the patient's SSI application.

## 2018-02-16 ENCOUNTER — Inpatient Hospital Stay (HOSPITAL_COMMUNITY)
Admission: RE | Admit: 2018-02-16 | Discharge: 2018-02-16 | Disposition: A | Discharge status: Home / Self Care | Payer: Medicare Other | Source: Ambulatory Visit

## 2018-02-16 ENCOUNTER — Telehealth: Payer: Self-pay

## 2018-02-16 NOTE — Telephone Encounter (Signed)
Call received from the patient requesting the phone # for Ranee Gosselin, [employer]. He stated that he needed to speak to her. He was unable to write down the phone # and requested that the phone #  be text  to him which this CM did.

## 2018-02-17 ENCOUNTER — Encounter (HOSPITAL_COMMUNITY): Payer: Self-pay

## 2018-02-17 ENCOUNTER — Telehealth: Payer: Self-pay | Admitting: Family Medicine

## 2018-02-17 ENCOUNTER — Other Ambulatory Visit (HOSPITAL_COMMUNITY): Payer: Self-pay

## 2018-02-17 NOTE — Progress Notes (Signed)
Paramedicine Encounter    Patient ID: Jacob [name], male    DOB: Jun 29, 1955, 62 y.o.   MRN: [id number]    Patient Care Team: Hoy Register, MD as PCP - General (Family Medicine) Clarisa Schools, RN as Registered Nurse Jena Gauss, Gerrit Friends, MD as Consulting Physician (Gastroenterology) Pleasant, Dennard Schaumann, RN as [facility] Care Management  Patient Active Problem List   Diagnosis Date Noted  . Hypokalemia   . Vitamin D deficiency 07/07/2017  . Exertional angina (HCC)   . Chronic systolic heart failure (HCC) 12/17/2016  . Hyperglycemia 12/02/2016  . Osteoarthritis of right knee 12/02/2016  . Congestive heart disease (HCC) 01/01/2016  . Diabetic neuropathy (HCC) 11/28/2015  . Ascites 11/09/2015  . CHF (congestive heart failure) (HCC) 11/09/2015  . Insomnia 04/18/2015  . Claudication of right lower extremity (HCC) 04/07/2015  . Cardiomyopathy, ischemic 04/07/2015  . Chronic anticoagulation 03/30/2015  . Cardiorenal syndrome with renal failure   . SOB (shortness of breath)   . Morbid obesity due to excess calories (HCC)   . Helicobacter pylori ab+ 12/12/2014  . Calf pain   . Duodenal ulcer with hemorrhage   . Hemorrhagic shock (HCC)   . Cardiogenic shock (HCC)   . Diabetes mellitus (HCC)   . Coronary artery disease involving native coronary artery of native heart without angina pectoris   . Atrial fibrillation with rapid ventricular response (HCC) 10/29/2014  . Chest pain   . Atherosclerosis of native arteries of extremity with intermittent claudication (HCC) 09/06/2014  . Atrial fibrillation with controlled ventricular response (HCC) 08/03/2014  . Bacteremia   . DM (diabetes mellitus), type 2 with peripheral vascular complications (HCC) 08/01/2014  . Anemia of chronic disease 08/01/2014  . PVD (peripheral vascular disease) (HCC) 10/29/2013  . Tobacco abuse 05/13/2013  . AF (atrial fibrillation) (HCC) 02/11/2013  . ICD - in place- BS May 2014 [facility] 02/11/2013  .  Microcytic anemia 01/11/2013  . 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 Noncompliance 12/01/2011  . Essential hypertension 10/30/2006    Current Outpatient Medications:  .  ACCU-CHEK SOFTCLIX LANCETS lancets, Use for once daily testing of blood sugar, Disp: 100 each, Rfl: 12 .  acetaminophen-codeine (TYLENOL #3) 300-30 MG tablet, TAKE 1 TABLET BY MOUTH AT BEDTIME AS NEEDED FOR MODERATE PAIN., Disp: 30 tablet, Rfl: 0 .  albuterol (PROVENTIL HFA;VENTOLIN HFA) 108 (90 Base) MCG/ACT inhaler, Inhale 2 puffs into the lungs every 6 (six) hours as needed for wheezing or shortness of breath., Disp: 1 Inhaler, Rfl: 2 .  albuterol (PROVENTIL) (2.5 MG/3ML) 0.083% nebulizer solution, Take 3 mLs (2.5 mg total) by nebulization every 6 (six) hours as needed for wheezing or shortness of breath., Disp: 150 mL, Rfl: 1 .  allopurinol (ZYLOPRIM) 100 MG tablet, TAKE 2 TABLETS (200 MG TOTAL) BY MOUTH EVERY MORNING, Disp: 60 tablet, Rfl: 3 .  atorvastatin (LIPITOR) 40 MG tablet, take 1 TABLET BY MOUTH EVERY EVENING at 6pm, Disp: 30 tablet, Rfl: 6 .  Blood Glucose Monitoring Suppl (ACCU-CHEK AVIVA) device, Use as instructed1 times daily before meals, Disp: 1 each, Rfl: 0 .  cetirizine (ZYRTEC) 10 MG tablet, TAKE 1 TABLET (10 MG TOTAL) BY MOUTH DAILY., Disp: 30 tablet, Rfl: 2 .  diclofenac sodium (VOLTAREN) 1 % GEL, APPLY 4 GRAMS TOPICALLY 4 (FOUR) TIMES DAILY, Disp: 100 g, Rfl: 0 .  digoxin (LANOXIN) 0.125 MG tablet, TAKE 1/2 TABLET BY MOUTH DAILY, Disp: 14 tablet, Rfl: 3 .  ergocalciferol (DRISDOL) 50000 units capsule,  Take 1 capsule (50,000 Units total) by mouth once a week. (Patient not taking: Reported on 11/25/2017), Disp: 4 capsule, Rfl: 1 .  gabapentin (NEURONTIN) 300 MG capsule, TAKE 1 CAPSULE (300 MG TOTAL) BY MOUTH DAILY., Disp: 30 capsule, Rfl: 3 .  glipiZIDE (GLUCOTROL) 10 MG tablet, Take 1 tablet (10 mg total) by mouth 2 (two) times daily before a meal., Disp: 60 tablet, Rfl: 5 .  glucose blood  (ACCU-CHEK AVIVA) test strip, Use as instructed for 1 times daily testing of blood sugar, Disp: 100 each, Rfl: 12 .  hydrALAZINE (APRESOLINE) 25 MG tablet, TAKE 1 TABLET BY MOUTH THREE TIMES DAILY ( EVERY MORNING, NOON,EVENING ), Disp: 90 tablet, Rfl: 3 .  Insulin Glargine (LANTUS) 100 UNIT/ML Solostar Pen, Inject 50 Units into the skin daily at 10 pm., Disp: 5 pen, Rfl: 5 .  isosorbide mononitrate (IMDUR) 30 MG 24 hr tablet, Take 1 tablet (30 mg total) by mouth daily., Disp: 30 tablet, Rfl: 3 .  Lancet Devices (ACCU-CHEK SOFTCLIX) lancets, Use as instructed for once daily testing of blood sugar, Disp: 1 each, Rfl: 0 .  meclizine (ANTIVERT) 25 MG tablet, TAKE 1 TABLET (25 MG TOTAL) BY MOUTH 3 (THREE) TIMES DAILY AS NEEDED FOR DIZZINESS., Disp: 60 tablet, Rfl: 0 .  metolazone (ZAROXOLYN) 2.5 MG tablet, Take 1 tablet once as directed by CHF clinic., Disp: 10 tablet, Rfl: 0 .  potassium chloride SA (K-DUR,KLOR-CON) 20 MEQ tablet, Take 2 tablets (40 mEq total) by mouth daily as needed. TAKE ONLY ON DAYS YOU TAKE METOLAZONE!!, Disp: 60 tablet, Rfl: 11 .  spironolactone (ALDACTONE) 25 MG tablet, Take 0.5 tablets (12.5 mg total) by mouth daily., Disp: 30 tablet, Rfl: 3 .  torsemide (DEMADEX) 20 MG tablet, TAKE 4 TABLETS (80 MG TOTAL) BY MOUTH 2 (TWO) TIMES DAILY., Disp: 240 tablet, Rfl: 3 .  traZODone (DESYREL) 100 MG tablet, Take 1 tablet (100 mg total) by mouth at bedtime as needed for sleep., Disp: 30 tablet, Rfl: 3 .  XARELTO 20 MG TABS tablet, TAKE ONE TABLET BY MOUTH ONCE DAILY, Disp: 30 tablet, Rfl: 2 Allergies  Allergen Reactions  . Pork-Derived Products Other (See Comments)    Religious preference     Social History   Socioeconomic History  . Marital status: Divorced    Spouse name: Not on file  . Number of children: 3  . Years of education: Not on file  . Highest education level: Not on file  Occupational History  . Occupation: disabled  Social Needs  . Financial resource strain: Not  on file  . Food insecurity:    Worry: Not on file    Inability: Not on file  . Transportation needs:    Medical: Not on file    Non-medical: Not on file  Tobacco Use  . Smoking status: Current Every Day Smoker    Packs/day: 0.50    Years: 40.00    Pack years: 20.00    Types: Cigarettes  . Smokeless tobacco: Never Used  Substance and Sexual Activity  . Alcohol use: No    Alcohol/week: 0.0 standard drinks  . Drug use: No  . Sexual activity: Never  Lifestyle  . Physical activity:    Days per week: Not on file    Minutes per session: Not on file  . Stress: Not on file  Relationships  . Social connections:    Talks on phone: Not on file    Gets together: Not on file    Attends religious  service: Not on file    Active member of club or organization: Not on file    Attends meetings of clubs or organizations: Not on file    Relationship status: Not on file  . Intimate partner violence:    Fear of current or ex partner: Not on file    Emotionally abused: Not on file    Physically abused: Not on file    Forced sexual activity: Not on file  Other Topics Concern  . Not on file  Social History Narrative   Has an apartment with a roommate. He was living on the streets in January 25, 2013.  He reports that his father died in [place] in 01-25-2013.  He is divorced.  He is no longer estranged from his son, but still from his daughter who lives locally.  Neither of his parents, nor any siblings have any history of CAD.    Physical Exam  Pulmonary/Chest: Effort normal. He has no wheezes. He has no rales.  Abdominal: Soft. There is no guarding.  Musculoskeletal: He exhibits edema.  +2 feet upto lower leg  Skin: Skin is warm and dry. He is not diaphoretic.        Future Appointments  Date Time Provider Department Center  02/18/2018  3:50 PM Hoy Register, MD [facility] None     BP 130/68 (BP Location: Left Arm, Patient Position: Sitting, Cuff Size: Normal)   Pulse (!) 113    Resp 16   Wt 223 lb 6.4 oz (101.3 kg)   SpO2 96%   BMI 37.18 kg/m   CBG 206  Weight yesterday-didn't weigh Last visit weight-219 (11/14)  ATF pt CAO x4 sitting on the side of the bed cc tiredness. Pt stated that he was up last night into early morning playing cards with friends.  Pt denies trouble sleeping normally.  Pt denies sob, chest pain and dizziness. Pt was given the rest of the pill packs [facility]. Pt still has pills left over from the previous pack which should be empty.  We discussed his compliance with his medications and sodium intake.    Pt's aide advised that needs a shower chair/i will f/u with Erskine Squibb @ [facility].  Medication ordered:none  Vivian Okelley, EMT Paramedic [phone] 02/17/2018    ACTION: Home visit completed

## 2018-02-17 NOTE — Telephone Encounter (Signed)
Will route to PCP for review. 

## 2018-02-17 NOTE — Telephone Encounter (Signed)
Jacob [name] with [facility] called because patient is in need of a shower chair and she would like to know if one can be prescribed. Please follow up.

## 2018-02-18 ENCOUNTER — Ambulatory Visit: Payer: Medicare Other | Admitting: Family Medicine

## 2018-02-18 MED ORDER — MISC. DEVICES MISC: 0 refills | Status: AC

## 2018-02-18 NOTE — Telephone Encounter (Signed)
Prescription has been written.

## 2018-02-18 NOTE — Telephone Encounter (Signed)
rx has been faxed over to [facility].

## 2018-02-24 ENCOUNTER — Telehealth: Payer: Self-pay

## 2018-02-24 NOTE — Telephone Encounter (Signed)
Request from [facility] Copper, EMT for a shower chair for the patient.  Informed her that Dr Alvis Lemmings would be notified of the request.

## 2018-02-24 NOTE — Telephone Encounter (Signed)
Call placed to [employer], spoke to Alycia to inquire about the order for the shower chair. She said that they left a message for the patient on 02/20/18 and are waiting for a call back prior to shipping. There is no financial responsibility for the patient.  He can call # [phone] and accept delivery.  Call placed to [facility] Copper, EMT and informed her of above note from [facility]  Call placed to the patient and informed him of above call with [facility]. He took the phone #  And said that he would call to confirm delivery.

## 2018-02-24 NOTE — Telephone Encounter (Signed)
This was addressed on 02/18/2018

## 2018-02-26 ENCOUNTER — Encounter (HOSPITAL_COMMUNITY): Payer: Self-pay

## 2018-02-26 ENCOUNTER — Telehealth (HOSPITAL_COMMUNITY): Payer: Self-pay

## 2018-02-26 ENCOUNTER — Ambulatory Visit (HOSPITAL_COMMUNITY)
Admission: RE | Admit: 2018-02-26 | Discharge: 2018-02-26 | Disposition: A | Discharge status: Home / Self Care | Payer: Medicare Other | Source: Ambulatory Visit | Attending: Internal Medicine | Admitting: Internal Medicine

## 2018-02-26 VITALS — BP 120/70 | HR 114 | Wt 228.0 lb

## 2018-02-26 DIAGNOSIS — Z720 Tobacco use: Secondary | ICD-10-CM

## 2018-02-26 DIAGNOSIS — E876 Hypokalemia: Secondary | ICD-10-CM | POA: Diagnosis not present

## 2018-02-26 DIAGNOSIS — I252 Old myocardial infarction: Secondary | ICD-10-CM | POA: Insufficient documentation

## 2018-02-26 DIAGNOSIS — Z9119 Patient's noncompliance with other medical treatment and regimen: Secondary | ICD-10-CM

## 2018-02-26 DIAGNOSIS — Z951 Presence of aortocoronary bypass graft: Secondary | ICD-10-CM | POA: Diagnosis not present

## 2018-02-26 DIAGNOSIS — I2510 Atherosclerotic heart disease of native coronary artery without angina pectoris: Secondary | ICD-10-CM | POA: Insufficient documentation

## 2018-02-26 DIAGNOSIS — Z79899 Other long term (current) drug therapy: Secondary | ICD-10-CM | POA: Insufficient documentation

## 2018-02-26 DIAGNOSIS — Z794 Long term (current) use of insulin: Secondary | ICD-10-CM | POA: Insufficient documentation

## 2018-02-26 DIAGNOSIS — F17210 Nicotine dependence, cigarettes, uncomplicated: Secondary | ICD-10-CM | POA: Diagnosis not present

## 2018-02-26 DIAGNOSIS — I482 Chronic atrial fibrillation, unspecified: Secondary | ICD-10-CM | POA: Insufficient documentation

## 2018-02-26 DIAGNOSIS — I4819 Other persistent atrial fibrillation: Secondary | ICD-10-CM

## 2018-02-26 DIAGNOSIS — E1151 Type 2 diabetes mellitus with diabetic peripheral angiopathy without gangrene: Secondary | ICD-10-CM | POA: Diagnosis not present

## 2018-02-26 DIAGNOSIS — I5022 Chronic systolic (congestive) heart failure: Secondary | ICD-10-CM | POA: Diagnosis not present

## 2018-02-26 DIAGNOSIS — Z7901 Long term (current) use of anticoagulants: Secondary | ICD-10-CM | POA: Insufficient documentation

## 2018-02-26 DIAGNOSIS — Z91199 Patient's noncompliance with other medical treatment and regimen due to unspecified reason: Secondary | ICD-10-CM

## 2018-02-26 DIAGNOSIS — I110 Hypertensive heart disease with heart failure: Secondary | ICD-10-CM | POA: Insufficient documentation

## 2018-02-26 LAB — BASIC METABOLIC PANEL
Anion gap: 12 (ref 5–15)
BUN: 26 mg/dL — ABNORMAL HIGH (ref 8–23)
CO2: 26 mmol/L (ref 22–32)
Calcium: 8.4 mg/dL — ABNORMAL LOW (ref 8.9–10.3)
Chloride: 96 mmol/L — ABNORMAL LOW (ref 98–111)
Creatinine, Ser: 1.34 mg/dL — ABNORMAL HIGH (ref 0.61–1.24)
GFR calc Af Amer: >60 mL/min (ref >60)
GFR calc non Af Amer: 57 mL/min — ABNORMAL LOW (ref >60)
Glucose, Bld: 169 mg/dL — ABNORMAL HIGH (ref 70–99)
Potassium: 3.6 mmol/L (ref 3.5–5.1)
SODIUM: 134 mmol/L — AB (ref 135–145)

## 2018-02-26 NOTE — Telephone Encounter (Signed)
Pt called to remind him of the appointment @ heart failure clinic today @ 14:30 and that the cab will pick him up @ 1400

## 2018-02-26 NOTE — Patient Instructions (Signed)
Labs today We will only contact you if something comes back abnormal or we need to make some changes. Otherwise no news is good news!  Your physician recommends that you schedule a follow-up appointment in: 3 month in NP/PA  Do the following things EVERYDAY: 1) Weigh yourself in the morning before breakfast. Write it down and keep it in a log. 2) Take your medicines as prescribed 3) Eat low salt foods-Limit salt (sodium) to 2000 mg per day.  4) Stay as active as you can everyday 5) Limit all fluids for the day to less than 2 liters

## 2018-02-26 NOTE — Progress Notes (Signed)
Patient ID: Jacob [name], male   DOB: 01-27-1956, 62 y.o.   MRN: [id number]    Advanced Heart Failure Clinic Note   PCP: [facility] Primary HF Cardiologist: Dr Gala Romney   HPI: Jacob [name] is a 62 y.o. male [place] male with history of systolic HF EF 20-25% via Echo 08/03/14 s/p INCEPTA ICD implant 6/14, RV dysfunction, CAD s/p CABG x 2 w/ RF MAZE 7/14 at [facility], DM type II, Chronic afib on xarelto, GI bleed 11/28/2014, and HLD.   Admitted the end of August 2016  with increased dyspnea on exertion. Later found to be in cardiogenic shock . At one point on dual pressors milrinone and norepi. Diuresed with IV lasix and transitioned to po lasix. Hospital course complicated by GI bleed and A fib RVR. Loaded on amio but later placed on toprol xl for rate control. On 9/5 had EGD with duodenal bleed with clip applied. He was discharged 9/12 with D/C weight 203 pounds.   Admitted 1/29 - 04/24/17 with CP and HF. Cath 04/23/17 as below with stable CAD and compensated filling pressures after IV diuresis.   Admitted 6/21 - 09/13/17 for CP. Troponin flat at 0.06. He was diuresed with IV lasix, and then left on 09/13/17, though he was requested to stay one more day.   Today he returns for HF follow up. Overall feeling fine. SOB with exertion. No chest pain. Denies PND/Orthopnea. Appetite ok. No fever or chills. Weight at home has been 223 pounds. He is only taking medications a couple days a week which is reported by HF Paramedic. He has bubble packs for his medications. He is taking metolazone a few days a week. He has trouble with transportation.   SH: Lives in low income housing. Estranged from family  Review of systems complete and found to be negative unless listed in HPI.   [facility] 04/23/17  Ost RCA to Prox RCA lesion is 100% stenosed.  Ost Cx to Prox Cx lesion is 99% stenosed.  Ost LAD to Prox LAD lesion is 40% stenosed.  Prox LAD to Mid LAD lesion is 40% stenosed.  Dist LAD lesion is 70%  stenosed.   Findings:  Ao = 126/66 (86)  LV = 118/14 RA = 8 RV = 44/8 PA = 52/17 (34) PCW = 17 Fick cardiac output/index = 4.2/2.0 PVR = 4.0 WU Ao sat = 98% PA sat = 60%, 61%  Assessment: 1. 3v CAD with stable revascularization with patent SVG to LPDA and SVG to Ramus 2. LAD with non-obstructive disease 3. Filling pressures normal 4. Moderately depressed cardiac output  SH:  Social History   Socioeconomic History  . Marital status: Divorced    Spouse name: Not on file  . Number of children: 3  . Years of education: Not on file  . Highest education level: Not on file  Occupational History  . Occupation: disabled  Social Needs  . Financial resource strain: Not on file  . Food insecurity:    Worry: Not on file    Inability: Not on file  . Transportation needs:    Medical: Not on file    Non-medical: Not on file  Tobacco Use  . Smoking status: Current Every Day Smoker    Packs/day: 0.50    Years: 40.00    Pack years: 20.00    Types: Cigarettes  . Smokeless tobacco: Never Used  Substance and Sexual Activity  . Alcohol use: No    Alcohol/week: 0.0 standard drinks  .  Drug use: No  . Sexual activity: Never  Lifestyle  . Physical activity:    Days per week: Not on file    Minutes per session: Not on file  . Stress: Not on file  Relationships  . Social connections:    Talks on phone: Not on file    Gets together: Not on file    Attends religious service: Not on file    Active member of club or organization: Not on file    Attends meetings of clubs or organizations: Not on file    Relationship status: Not on file  . Intimate partner violence:    Fear of current or ex partner: Not on file    Emotionally abused: Not on file    Physically abused: Not on file    Forced sexual activity: Not on file  Other Topics Concern  . Not on file  Social History Narrative   Has an apartment with a roommate. He was living on the streets in January 06, 2013.  He reports that  his father died in [place] in 2013-01-06.  He is divorced.  He is no longer estranged from his son, but still from his daughter who lives locally.  Neither of his parents, nor any siblings have any history of CAD.   FH:  Family History  Problem Relation Age of Onset  . Diabetes Mother    Past Medical History:  Diagnosis Date  . Atrial fibrillation (HCC)    RVR 10/2014  . Automatic implantable cardioverter-defibrillator in situ   . CAD (coronary artery disease) Sept 2013   s/p cardiac cath showing occlusion of small RCA with collaterals  . CHF (congestive heart failure) (HCC)    20 to 25 % EF and RV dysfunction by 07/2014 echo   . Chronic anticoagulation    on xarelto.   . High cholesterol   . Hypertension   . Myocardial infarction (HCC) 2014  . Noncompliance    homelessness contributing.   . Peripheral arterial disease (HCC)   . Type II diabetes mellitus (HCC)    Current Outpatient Medications  Medication Sig Dispense Refill  . ACCU-CHEK SOFTCLIX LANCETS lancets Use for once daily testing of blood sugar 100 each 12  . acetaminophen-codeine (TYLENOL #3) 300-30 MG tablet TAKE 1 TABLET BY MOUTH AT BEDTIME AS NEEDED FOR MODERATE PAIN. 30 tablet 0  . albuterol (PROVENTIL HFA;VENTOLIN HFA) 108 (90 Base) MCG/ACT inhaler Inhale 2 puffs into the lungs every 6 (six) hours as needed for wheezing or shortness of breath. 1 Inhaler 2  . albuterol (PROVENTIL) (2.5 MG/3ML) 0.083% nebulizer solution Take 3 mLs (2.5 mg total) by nebulization every 6 (six) hours as needed for wheezing or shortness of breath. 150 mL 1  . allopurinol (ZYLOPRIM) 100 MG tablet TAKE 2 TABLETS (200 MG TOTAL) BY MOUTH EVERY MORNING 60 tablet 3  . atorvastatin (LIPITOR) 40 MG tablet take 1 TABLET BY MOUTH EVERY EVENING at 6pm 30 tablet 6  . Blood Glucose Monitoring Suppl (ACCU-CHEK AVIVA) device Use as instructed1 times daily before meals 1 each 0  . cetirizine (ZYRTEC) 10 MG tablet TAKE 1 TABLET (10 MG TOTAL) BY MOUTH DAILY.  30 tablet 2  . diclofenac sodium (VOLTAREN) 1 % GEL APPLY 4 GRAMS TOPICALLY 4 (FOUR) TIMES DAILY 100 g 0  . digoxin (LANOXIN) 0.125 MG tablet TAKE 1/2 TABLET BY MOUTH DAILY 14 tablet 3  . ergocalciferol (DRISDOL) 50000 units capsule Take 1 capsule (50,000 Units total) by mouth once a week.  4 capsule 1  . gabapentin (NEURONTIN) 300 MG capsule TAKE 1 CAPSULE (300 MG TOTAL) BY MOUTH DAILY. 30 capsule 3  . glipiZIDE (GLUCOTROL) 10 MG tablet Take 1 tablet (10 mg total) by mouth 2 (two) times daily before a meal. 60 tablet 5  . glucose blood (ACCU-CHEK AVIVA) test strip Use as instructed for 1 times daily testing of blood sugar 100 each 12  . hydrALAZINE (APRESOLINE) 25 MG tablet TAKE 1 TABLET BY MOUTH THREE TIMES DAILY ( EVERY MORNING, NOON,EVENING ) 90 tablet 3  . Insulin Glargine (LANTUS) 100 UNIT/ML Solostar Pen Inject 50 Units into the skin daily at 10 pm. 5 pen 5  . isosorbide mononitrate (IMDUR) 30 MG 24 hr tablet Take 1 tablet (30 mg total) by mouth daily. 30 tablet 3  . Lancet Devices (ACCU-CHEK SOFTCLIX) lancets Use as instructed for once daily testing of blood sugar 1 each 0  . meclizine (ANTIVERT) 25 MG tablet TAKE 1 TABLET (25 MG TOTAL) BY MOUTH 3 (THREE) TIMES DAILY AS NEEDED FOR DIZZINESS. 60 tablet 0  . metolazone (ZAROXOLYN) 2.5 MG tablet Take 1 tablet once as directed by CHF clinic. 10 tablet 0  . Misc. Devices MISC Shower chair.  Diagnosis: CHF 1 each 0  . potassium chloride SA (K-DUR,KLOR-CON) 20 MEQ tablet Take 2 tablets (40 mEq total) by mouth daily as needed. TAKE ONLY ON DAYS YOU TAKE METOLAZONE!! 60 tablet 11  . spironolactone (ALDACTONE) 25 MG tablet Take 0.5 tablets (12.5 mg total) by mouth daily. 30 tablet 3  . torsemide (DEMADEX) 20 MG tablet TAKE 4 TABLETS (80 MG TOTAL) BY MOUTH 2 (TWO) TIMES DAILY. 240 tablet 3  . traZODone (DESYREL) 100 MG tablet Take 1 tablet (100 mg total) by mouth at bedtime as needed for sleep. 30 tablet 3  . XARELTO 20 MG TABS tablet TAKE ONE TABLET  BY MOUTH ONCE DAILY 30 tablet 2   No current facility-administered medications for this encounter.    Vitals:   02/26/18 1447  BP: 120/70  Pulse: (!) 114  SpO2: 95%  Weight: 103.4 kg (228 lb)   Wt Readings from Last 3 Encounters:  02/26/18 103.4 kg (228 lb)  02/17/18 101.3 kg (223 lb 6.4 oz)  02/05/18 99.3 kg (219 lb)     PHYSICAL EXAM: General:  Appears chronically ill. No resp difficulty HEENT: normal Neck: supple. JVP 7-8  Carotids 2+ bilat; no bruits. No lymphadenopathy or thryomegaly appreciated. Cor: PMI nondisplaced. Irregular Regular rate & rhythm. No rubs, gallops or murmurs. Lungs: clear Abdomen: soft, nontender, nondistended. No hepatosplenomegaly. No bruits or masses. Good bowel sounds. Extremities: no cyanosis, clubbing, rash, edema Neuro: alert & orientedx3, cranial nerves grossly intact. moves all 4 extremities w/o difficulty. Affect pleasant  EKG: A fib 111 bpm   ASSESSMENT & PLAN: 1. Chronic Systolic Heart Failure: Echo 10/2015 EF 25-30%. Has [employer] ICD. Echo 08/2017: EF 20-25% - R/LHC 03/2017 as above with stable CAD and compensated filling pressures. CI 2.0 -NYHA IIIb. Continue current diuretic regimen. Volume status stable today.  - Continue spiro 25 mg daily.  - Continue digoxin 0.0625 mg daily. Dig level today.  - Continue hydralazine 25 mg TID + imdur 30 mg daily.  - No beta blocker with acute decompensation  2. CAD s/p CABG 2014 - R/LHC 03/2017 as above with stable CAD and compensated filling pressures.  - No s/s ischemia  - Continue xarelto, statin  2. Chronic A fib -Uncontrolled. Refuses cardioversion. .  - Continue Xarelto for  anticoagulation.  - High Res Chest CT with questionable fibrotic interstitial pneumonitis cannot be excluded.  - No longer on amiodarone due to changes on Chest CT.   3. H/O GI Bleed - duodenal ulcer with clip 11/2003 - Denies bleeding.    4. PAD- Last ABI 2015  - R EIA stent with known bilateral SFA  occlusion.  - Follows with Dr. Allyson Sabal: "He is not a candidate for endovascular therapy of his SFAs and would require femoropopliteal bypass grafting which I think he would be high risk for given his cardiac situation" - Continues to smoke. Encouraged complete cessation. No change.   5. Social  Issues - Resides in low income housing.  - Nurse, adult. No change.   6. Smoking - Discussed smoking cessation.   7. HTN -Stable.   8. Hypokalemia -BMET today.   Follow up in 3 months. Check BMET tonight. Difficulty to manage due to poor insight. Continue Paramedicine.    Tonye Becket, NP  2:56 PM

## 2018-02-27 ENCOUNTER — Other Ambulatory Visit (HOSPITAL_COMMUNITY): Payer: Self-pay

## 2018-02-27 ENCOUNTER — Telehealth (HOSPITAL_COMMUNITY): Payer: Self-pay

## 2018-02-27 NOTE — Progress Notes (Signed)
[facility] shower [facility] to pt's home. Pt was unable to assemble the chair. I went to pt's home and put it together and adjusted it per pt's request.

## 2018-02-27 NOTE — Telephone Encounter (Signed)
Pt called to confirm [facility] visit.  

## 2018-03-04 ENCOUNTER — Other Ambulatory Visit: Payer: Self-pay | Admitting: Family Medicine

## 2018-03-04 DIAGNOSIS — R0609 Other forms of dyspnea: Secondary | ICD-10-CM

## 2018-03-04 DIAGNOSIS — I25811 Atherosclerosis of native coronary artery of transplanted heart without angina pectoris: Secondary | ICD-10-CM

## 2018-03-04 DIAGNOSIS — I482 Chronic atrial fibrillation, unspecified: Secondary | ICD-10-CM

## 2018-03-12 ENCOUNTER — Other Ambulatory Visit (HOSPITAL_COMMUNITY): Payer: Self-pay

## 2018-03-12 ENCOUNTER — Telehealth (HOSPITAL_COMMUNITY): Payer: Self-pay

## 2018-03-12 ENCOUNTER — Ambulatory Visit: Payer: Medicare Other | Admitting: Family Medicine

## 2018-03-12 NOTE — Progress Notes (Signed)
Paramedicine Encounter    Patient ID: Jacob PunchesYahia [name], male    DOB: 1955-08-11, 62 y.o.   MRN: [id number]    Patient Care Team: [name], Enobong, MD as PCP - General (Family Medicine) Clarisa SchoolsScott, Tim W, RN as Registered Nurse Rourk, Gerrit Friendsobert M, MD as Consulting Physician (Gastroenterology) Pleasant, Dennard SchaumannFrances H, RN as [facility] Care Management Uris, Wyn QuakerJenna H, LCSW as Social Worker (Licensed Clinical Social Worker)  Patient Active Problem List   Diagnosis Date Noted  . Hypokalemia   . Vitamin D deficiency 07/07/2017  . Exertional angina (HCC)   . Chronic systolic heart failure (HCC) 12/17/2016  . Hyperglycemia 12/02/2016  . Osteoarthritis of right knee 12/02/2016  . Congestive heart disease (HCC) 01/01/2016  . Diabetic neuropathy (HCC) 11/28/2015  . Ascites 11/09/2015  . CHF (congestive heart failure) (HCC) 11/09/2015  . Insomnia 04/18/2015  . Claudication of right lower extremity (HCC) 04/07/2015  . Cardiomyopathy, ischemic 04/07/2015  . Chronic anticoagulation 03/30/2015  . Cardiorenal syndrome with renal failure   . SOB (shortness of breath)   . Morbid obesity due to excess calories (HCC)   . Helicobacter pylori ab+ 12/12/2014  . Calf pain   . Duodenal ulcer with hemorrhage   . Hemorrhagic shock (HCC)   . Cardiogenic shock (HCC)   . Diabetes mellitus (HCC)   . Coronary artery disease involving native coronary artery of native heart without angina pectoris   . Atrial fibrillation with rapid ventricular response (HCC) 10/29/2014  . Chest pain   . Atherosclerosis of native arteries of extremity with intermittent claudication (HCC) 09/06/2014  . Atrial fibrillation with controlled ventricular response (HCC) 08/03/2014  . Bacteremia   . DM (diabetes mellitus), type 2 with peripheral vascular complications (HCC) 08/01/2014  . Anemia of chronic disease 08/01/2014  . PVD (peripheral vascular disease) (HCC) 10/29/2013  . Tobacco abuse 05/13/2013  . AF (atrial fibrillation)  (HCC) 02/11/2013  . ICD - in place- BS May 2014 [facility] [place] 02/11/2013  . Microcytic anemia 01/11/2013  . CAD- s/p CABG [facility] 12/04/2011  . Noncompliance 12/01/2011  . Essential hypertension 10/30/2006    Current Outpatient Medications:  .  allopurinol (ZYLOPRIM) 100 MG tablet, TAKE 2 TABLETS (200 MG TOTAL) BY MOUTH EVERY MORNING, Disp: 60 tablet, Rfl: 3 .  atorvastatin (LIPITOR) 40 MG tablet, TAKE 1 TABLET BY MOUTH EVERY EVENING AT 6 PM, Disp: 30 tablet, Rfl: 0 .  cetirizine (ZYRTEC) 10 MG tablet, TAKE 1 TABLET (10 MG TOTAL) BY MOUTH DAILY., Disp: 30 tablet, Rfl: 2 .  digoxin (LANOXIN) 0.125 MG tablet, TAKE 1/2 TABLET BY MOUTH DAILY, Disp: 14 tablet, Rfl: 3 .  gabapentin (NEURONTIN) 300 MG capsule, TAKE 1 CAPSULE (300 MG TOTAL) BY MOUTH DAILY., Disp: 30 capsule, Rfl: 3 .  glipiZIDE (GLUCOTROL) 10 MG tablet, Take 1 tablet (10 mg total) by mouth 2 (two) times daily before a meal., Disp: 60 tablet, Rfl: 5 .  hydrALAZINE (APRESOLINE) 25 MG tablet, TAKE 1 TABLET BY MOUTH THREE TIMES DAILY ( EVERY MORNING, NOON,EVENING ), Disp: 90 tablet, Rfl: 3 .  meclizine (ANTIVERT) 25 MG tablet, TAKE 1 TABLET (25 MG TOTAL) BY MOUTH 3 (THREE) TIMES DAILY AS NEEDED FOR DIZZINESS., Disp: 60 tablet, Rfl: 0 .  metolazone (ZAROXOLYN) 2.5 MG tablet, Take 1 tablet once as directed by CHF clinic., Disp: 10 tablet, Rfl: 0 .  potassium chloride SA (K-DUR,KLOR-CON) 20 MEQ tablet, Take 2 tablets (40 mEq total) by mouth daily as needed. TAKE ONLY ON DAYS YOU TAKE METOLAZONE!!,  Disp: 60 tablet, Rfl: 11 .  spironolactone (ALDACTONE) 25 MG tablet, Take 0.5 tablets (12.5 mg total) by mouth daily., Disp: 30 tablet, Rfl: 3 .  torsemide (DEMADEX) 20 MG tablet, TAKE 4 TABLETS (80 MG TOTAL) BY MOUTH 2 (TWO) TIMES DAILY., Disp: 240 tablet, Rfl: 3 .  ACCU-CHEK SOFTCLIX LANCETS lancets, Use for once daily testing of blood sugar, Disp: 100 each, Rfl: 12 .  acetaminophen-codeine (TYLENOL #3) 300-30 MG tablet, TAKE 1 TABLET  BY MOUTH AT BEDTIME AS NEEDED FOR MODERATE PAIN., Disp: 30 tablet, Rfl: 0 .  albuterol (PROVENTIL) (2.5 MG/3ML) 0.083% nebulizer solution, Take 3 mLs (2.5 mg total) by nebulization every 6 (six) hours as needed for wheezing or shortness of breath., Disp: 150 mL, Rfl: 1 .  Blood Glucose Monitoring Suppl (ACCU-CHEK AVIVA) device, Use as instructed1 times daily before meals, Disp: 1 each, Rfl: 0 .  diclofenac sodium (VOLTAREN) 1 % GEL, APPLY 4 GRAMS TOPICALLY 4 (FOUR) TIMES DAILY, Disp: 100 g, Rfl: 0 .  ergocalciferol (DRISDOL) 50000 units capsule, Take 1 capsule (50,000 Units total) by mouth once a week., Disp: 4 capsule, Rfl: 1 .  glucose blood (ACCU-CHEK AVIVA) test strip, Use as instructed for 1 times daily testing of blood sugar, Disp: 100 each, Rfl: 12 .  Insulin Glargine (LANTUS) 100 UNIT/ML Solostar Pen, Inject 50 Units into the skin daily at 10 pm., Disp: 5 pen, Rfl: 5 .  isosorbide mononitrate (IMDUR) 30 MG 24 hr tablet, Take 1 tablet (30 mg total) by mouth daily., Disp: 30 tablet, Rfl: 3 .  Lancet Devices (ACCU-CHEK SOFTCLIX) lancets, Use as instructed for once daily testing of blood sugar, Disp: 1 each, Rfl: 0 .  Misc. Devices MISC, Paediatric nurse.  Diagnosis: CHF, Disp: 1 each, Rfl: 0 .  traZODone (DESYREL) 100 MG tablet, Take 1 tablet (100 mg total) by mouth at bedtime as needed for sleep., Disp: 30 tablet, Rfl: 3 .  VENTOLIN HFA 108 (90 Base) MCG/ACT inhaler, INHALE 2 PUFFS INTO THE LUNGS EVERY 6 (SIX) HOURS AS NEEDED FOR WHEEZING OR SHORTNESS OF BREATH., Disp: 18 g, Rfl: 0 .  XARELTO 20 MG TABS tablet, TAKE ONE TABLET BY MOUTH ONCE DAILY, Disp: 30 tablet, Rfl: 0 Allergies  Allergen Reactions  . Pork-Derived Products Other (See Comments)    Religious preference     Social History   Socioeconomic History  . Marital status: Divorced    Spouse name: Not on file  . Number of children: 3  . Years of education: Not on file  . Highest education level: Not on file  Occupational History   . Occupation: disabled  Social Needs  . Financial resource strain: Not on file  . Food insecurity:    Worry: Not on file    Inability: Not on file  . Transportation needs:    Medical: Not on file    Non-medical: Not on file  Tobacco Use  . Smoking status: Current Every Day Smoker    Packs/day: 0.50    Years: 40.00    Pack years: 20.00    Types: Cigarettes  . Smokeless tobacco: Never Used  Substance and Sexual Activity  . Alcohol use: No    Alcohol/week: 0.0 standard drinks  . Drug use: No  . Sexual activity: Never  Lifestyle  . Physical activity:    Days per week: Not on file    Minutes per session: Not on file  . Stress: Not on file  Relationships  . Social connections:  Talks on phone: Not on file    Gets together: Not on file    Attends religious service: Not on file    Active member of club or organization: Not on file    Attends meetings of clubs or organizations: Not on file    Relationship status: Not on file  . Intimate partner violence:    Fear of current or ex partner: Not on file    Emotionally abused: Not on file    Physically abused: Not on file    Forced sexual activity: Not on file  Other Topics Concern  . Not on file  Social History Narrative   Has an apartment with a roommate. He was living on the streets in 2013/01/15.  He reports that his father died in [place] in January 15, 2013.  He is divorced.  He is no longer estranged from his son, but still from his daughter who lives locally.  Neither of his parents, nor any siblings have any history of CAD.    Physical Exam Constitutional:      Appearance: He is not diaphoretic.  Pulmonary:     Effort: Pulmonary effort is normal. No respiratory distress.  Abdominal:     General: There is no distension.     Tenderness: There is no abdominal tenderness. There is no guarding.  Musculoskeletal:        General: Swelling, tenderness and signs of injury present.     Right lower leg: No edema.      Comments: Right big toe injury         Future Appointments  Date Time Provider Department Center  05/28/2018  2:30 PM [facility] PA/NP [facility] None     BP 122/70 (BP Location: Left Arm, Patient Position: Sitting, Cuff Size: Normal)   Pulse (!) 105   Resp 16   Wt 222 lb 3.2 oz (100.8 kg)   SpO2 98%   BMI 36.98 kg/m  CBG 130  Weight yesterday-didn't weigh Last visit weight-223  ATF pt CAO x4 sitting on the side of his bed.  Pt had injured his right toe yesterday, today the toe is swollen, red and is still seeping (yellow). Pt stated that he was in a lot of pain yesterday but it's a lot better today. He stated other than the injury to his toe, he feels fine. Pt denies sob, chest pain and dizziness.  He stated that he has taken all of his medications; he's taken potassium directly from the bottle.  rx bubble packs verified, insulin verified and albuterol inhaler.     I called [facility] for evaluation of his toe; appointment was scheduled for 10:10. Pt was offered transportation to the appointment, which he declined.  He stated that he would have his aide take him.  After finding out that his aide couldn't drive him, [facility] was called. [place] @ [facility] stated that transportation isn't offered to the appointments only from the appointments (home).  At that time he had to cancel the appointment.     Medication ordered: none Jacob [name], EMT Paramedic [phone] 03/12/2018    ACTION: Home visit completed

## 2018-03-12 NOTE — Telephone Encounter (Signed)
Pt called to confirm this am [facility] appointment

## 2018-03-19 ENCOUNTER — Ambulatory Visit: Payer: Medicare Other | Attending: Family Medicine | Admitting: Family Medicine

## 2018-03-19 ENCOUNTER — Encounter: Payer: Self-pay | Admitting: Family Medicine

## 2018-03-19 ENCOUNTER — Telehealth: Payer: Self-pay

## 2018-03-19 ENCOUNTER — Other Ambulatory Visit: Payer: Self-pay | Admitting: Family Medicine

## 2018-03-19 VITALS — BP 101/65 | HR 124 | Temp 97.3°F | Ht 65.0 in | Wt 231.0 lb

## 2018-03-19 DIAGNOSIS — I252 Old myocardial infarction: Secondary | ICD-10-CM | POA: Diagnosis not present

## 2018-03-19 DIAGNOSIS — I2510 Atherosclerotic heart disease of native coronary artery without angina pectoris: Secondary | ICD-10-CM | POA: Insufficient documentation

## 2018-03-19 DIAGNOSIS — Z6838 Body mass index (BMI) 38.0-38.9, adult: Secondary | ICD-10-CM

## 2018-03-19 DIAGNOSIS — I482 Chronic atrial fibrillation, unspecified: Secondary | ICD-10-CM | POA: Diagnosis not present

## 2018-03-19 DIAGNOSIS — Z95810 Presence of automatic (implantable) cardiac defibrillator: Secondary | ICD-10-CM | POA: Diagnosis not present

## 2018-03-19 DIAGNOSIS — I4891 Unspecified atrial fibrillation: Secondary | ICD-10-CM | POA: Diagnosis not present

## 2018-03-19 DIAGNOSIS — I255 Ischemic cardiomyopathy: Secondary | ICD-10-CM | POA: Diagnosis not present

## 2018-03-19 DIAGNOSIS — Z794 Long term (current) use of insulin: Secondary | ICD-10-CM | POA: Insufficient documentation

## 2018-03-19 DIAGNOSIS — Z955 Presence of coronary angioplasty implant and graft: Secondary | ICD-10-CM | POA: Insufficient documentation

## 2018-03-19 DIAGNOSIS — Z7901 Long term (current) use of anticoagulants: Secondary | ICD-10-CM | POA: Diagnosis not present

## 2018-03-19 DIAGNOSIS — Z951 Presence of aortocoronary bypass graft: Secondary | ICD-10-CM | POA: Insufficient documentation

## 2018-03-19 DIAGNOSIS — M79674 Pain in right toe(s): Secondary | ICD-10-CM | POA: Diagnosis not present

## 2018-03-19 DIAGNOSIS — S91201A Unspecified open wound of right great toe with damage to nail, initial encounter: Secondary | ICD-10-CM

## 2018-03-19 DIAGNOSIS — I5042 Chronic combined systolic (congestive) and diastolic (congestive) heart failure: Secondary | ICD-10-CM | POA: Diagnosis not present

## 2018-03-19 DIAGNOSIS — Z79899 Other long term (current) drug therapy: Secondary | ICD-10-CM | POA: Diagnosis not present

## 2018-03-19 DIAGNOSIS — I110 Hypertensive heart disease with heart failure: Secondary | ICD-10-CM | POA: Diagnosis not present

## 2018-03-19 DIAGNOSIS — I5022 Chronic systolic (congestive) heart failure: Secondary | ICD-10-CM | POA: Diagnosis not present

## 2018-03-19 DIAGNOSIS — S91209A Unspecified open wound of unspecified toe(s) with damage to nail, initial encounter: Secondary | ICD-10-CM

## 2018-03-19 DIAGNOSIS — Z590 Homelessness: Secondary | ICD-10-CM | POA: Insufficient documentation

## 2018-03-19 DIAGNOSIS — I25811 Atherosclerosis of native coronary artery of transplanted heart without angina pectoris: Secondary | ICD-10-CM

## 2018-03-19 DIAGNOSIS — E1151 Type 2 diabetes mellitus with diabetic peripheral angiopathy without gangrene: Secondary | ICD-10-CM | POA: Diagnosis not present

## 2018-03-19 DIAGNOSIS — F17210 Nicotine dependence, cigarettes, uncomplicated: Secondary | ICD-10-CM | POA: Insufficient documentation

## 2018-03-19 DIAGNOSIS — E1159 Type 2 diabetes mellitus with other circulatory complications: Secondary | ICD-10-CM

## 2018-03-19 DIAGNOSIS — E1149 Type 2 diabetes mellitus with other diabetic neurological complication: Secondary | ICD-10-CM

## 2018-03-19 DIAGNOSIS — G4709 Other insomnia: Secondary | ICD-10-CM

## 2018-03-19 LAB — GLUCOSE, POCT (MANUAL RESULT ENTRY): POC Glucose: 141 mg/dl — AB (ref 70–99)

## 2018-03-19 LAB — POCT GLYCOSYLATED HEMOGLOBIN (HGB A1C): HbA1c, POC (controlled diabetic range): 6.5 % (ref 0.0–7.0)

## 2018-03-19 MED ORDER — GABAPENTIN 300 MG PO CAPS: 300.0000 mg | ORAL_CAPSULE | Freq: Every day | ORAL | 3 refills | Status: AC

## 2018-03-19 MED ORDER — ATORVASTATIN CALCIUM 40 MG PO TABS: ORAL_TABLET | ORAL | 3 refills | Status: DC

## 2018-03-19 MED ORDER — DIGOXIN 125 MCG PO TABS: 62.5000 ug | ORAL_TABLET | Freq: Every day | ORAL | 3 refills | Status: DC

## 2018-03-19 MED ORDER — SPIRONOLACTONE 25 MG PO TABS: 12.5000 mg | ORAL_TABLET | Freq: Every day | ORAL | 3 refills | Status: DC

## 2018-03-19 MED ORDER — ALLOPURINOL 100 MG PO TABS: ORAL_TABLET | ORAL | 3 refills | Status: AC

## 2018-03-19 MED ORDER — INSULIN GLARGINE 100 UNIT/ML SOLOSTAR PEN: 48.0000 [IU] | PEN_INJECTOR | Freq: Every day | SUBCUTANEOUS | 5 refills | Status: AC

## 2018-03-19 MED ORDER — GLIPIZIDE 10 MG PO TABS: 10.0000 mg | ORAL_TABLET | Freq: Two times a day (BID) | ORAL | 5 refills | Status: AC

## 2018-03-19 MED ORDER — ISOSORBIDE MONONITRATE ER 30 MG PO TB24: 30.0000 mg | ORAL_TABLET | Freq: Every day | ORAL | 3 refills | Status: DC

## 2018-03-19 MED ORDER — TRAZODONE HCL 100 MG PO TABS: 100.0000 mg | ORAL_TABLET | Freq: Every evening | ORAL | 3 refills | Status: AC | PRN

## 2018-03-19 MED ORDER — HYDRALAZINE HCL 25 MG PO TABS: ORAL_TABLET | ORAL | 3 refills | Status: DC

## 2018-03-19 MED ORDER — METOLAZONE 2.5 MG PO TABS: ORAL_TABLET | ORAL | 0 refills | Status: DC

## 2018-03-19 MED ORDER — TORSEMIDE 20 MG PO TABS: 80.0000 mg | ORAL_TABLET | Freq: Two times a day (BID) | ORAL | 3 refills | Status: DC

## 2018-03-19 MED ORDER — CEPHALEXIN 500 MG PO CAPS: 500.0000 mg | ORAL_CAPSULE | Freq: Two times a day (BID) | ORAL | 0 refills | Status: DC

## 2018-03-19 NOTE — Telephone Encounter (Signed)
Opened in error

## 2018-03-19 NOTE — Patient Instructions (Signed)
Heart Failure and Exercise Heart failure is a condition in which the heart does not fill or pump enough blood and oxygen to support your body and its functions. Heart failure is a long-term (chronic) condition. Living with heart failure can be challenging. However, following your health care provider's instructions about a healthy lifestyle may help improve your symptoms. This includes choosing the right exercise plan. Doing daily physical activity is important after a diagnosis of heart failure. You may have some activity restrictions, so talk to your health care provider before doing any exercises. What are the benefits of exercise? Exercise may:  Make your heart muscles stronger.  Lower your blood pressure.  Lower your cholesterol.  Help you lose weight.  Help your bones stay strong.  Improve your blood circulation.  Help your body use oxygen better. This relieves symptoms such as fatigue and shortness of breath.  Help your mental health by lowering the risk of depression and other problems.  Improve your quality of life.  Decrease your chance of hospital admission for heart failure. What is an exercise plan? An exercise plan is a set of specific exercises and training activities. You will work with your health care provider to create the exercise plan that works for you. The plan may include:  Different types of exercises and how to do them.  Cardiac rehabilitation exercises. These are supervised programs that are designed to strengthen your heart. What are strengthening exercises? Strengthening exercises are a type of physical activity that involves using resistance to improve your muscle strength. Strengthening exercises usually have repetitive motions. These types of exercises can include:  Lifting weights.  Using weight machines.  Using resistance tubes and bands.  Using kettlebells.  Using your body weight, such as doing push-ups or squats. What are balance  exercises? Balance exercises are another type of physical activity. They strengthen the muscles of the back, stomach, and pelvis (core muscles) and improve your balance. They can also lower your risk of falling. These types of exercises can include:  Standing on one leg.  Walking backward, sideways, and in a straight line.  Standing up after sitting, without using your hands.  Shifting your weight from one leg to the other.  Lifting one leg in front of you.  Doing tai chi. This is a type of exercise that uses slow movements and deep breathing. How can I increase my flexibility? Having better flexibility can keep you from falling. It can also lengthen your muscles, improve your range of motion, and help your joints. You can increase your flexibility by:  Doing tai chi.  Doing yoga.  Stretching. How much aerobic exercise should I get?  Aerobic exercises strengthen your breathing and circulation system and increase your body's use of oxygen. Examples of aerobic exercise include biking, walking, running, and swimming. Talk to your health care provider to find out how much aerobic exercise is safe for you.  To do these exercises:  Start exercising slowly, limiting the amount of time at first. You may need to start with 5 minutes of aerobic exercise every day.  Slowly add more minutes until you can safely do at least 30 minutes of exercise at least 4 days a week. Summary  Daily physical activity is important after a diagnosis of heart failure.  Exercise can make your heart muscles stronger. It also offers other benefits that will improve your health.  Talk to your health care provider before doing any exercises. This information is not intended to replace advice  given to you by your health care provider. Make sure you discuss any questions you have with your health care provider. Document Released: 07/23/2016 Document Revised: 07/23/2016 Document Reviewed: 07/23/2016 Elsevier  Interactive Patient Education  2019 [employer].

## 2018-03-19 NOTE — Progress Notes (Addendum)
Subjective:  Patient ID: Jacob [name], male    DOB: 03-01-1956  Age: 62 y.o. MRN: [id number]  CC: Diabetes   HPI Jacob [name] is a 62 year old male with a history of type 2 diabetes mellitus (A1c  6.5), chronic combined systolic and diastolic heart failure status post ICD (EF 20-25% from 08/2017 severely reduced systolic function), chronic atrial fibrillation (on anticoagulation with Xarelto, amiodarone discontinued as fibrotic interstitial lung disease could not be excluded on high-resolution CT chest), CAD s/p CABG with MAZE peripheral vascular disease here for an acute visit complaining of right big toe pain after he bumped his toe 4 days ago while trying to get to the bathroom in the middle of the night.  He sustained bleeding and severe pain which have now ceased. Denies purulent discharge from toe or fever. He complains of dyspnea and thinks he has fluid in his lungs.  Last visit to the CHF clinic was on 02/26/2018 with no regimen changes. He continues to smoke half a pack of cigarettes per day and has no plans to quit His A1c 6.5 today and he denies hypoglycemic episodes; he is currently on 48 units of Lantus at bedtime  Past Medical History:  Diagnosis Date  . Atrial fibrillation (HCC)    RVR 10/2014  . Automatic implantable cardioverter-defibrillator in situ   . CAD (coronary artery disease) Sept 2013   s/p cardiac cath showing occlusion of small RCA with collaterals  . CHF (congestive heart failure) (HCC)    20 to 25 % EF and RV dysfunction by 07/2014 echo   . Chronic anticoagulation    on xarelto.   . High cholesterol   . Hypertension   . Myocardial infarction (HCC) 2014  . Noncompliance    homelessness contributing.   . Peripheral arterial disease (HCC)   . Type II diabetes mellitus (HCC)     Past Surgical History:  Procedure Laterality Date  . CARDIAC CATHETERIZATION  09/2012  . CORONARY ANGIOPLASTY WITH STENT PLACEMENT  11/2011   "1"  . CORONARY ARTERY BYPASS GRAFT   09/2012   2 vessels per patient [name]   . ESOPHAGOGASTRODUODENOSCOPY N/A 11/28/2014   Procedure: ESOPHAGOGASTRODUODENOSCOPY (EGD);  Surgeon: Beverley Fiedler, MD;  Location: [facility] ENDOSCOPY;  Service: Endoscopy;  Laterality: N/A;  . ILIAC ARTERY STENT Right 08/30/2013  . IMPLANTABLE CARDIOVERTER DEFIBRILLATOR IMPLANT     [place] in 07/2012; [employer]  . LEFT AND RIGHT HEART CATHETERIZATION WITH CORONARY ANGIOGRAM N/A 12/02/2011   Procedure: LEFT AND RIGHT HEART CATHETERIZATION WITH CORONARY ANGIOGRAM;  Surgeon: Kathleene Hazel, MD;  Location: [facility] CATH LAB;  Service: Cardiovascular;  Laterality: N/A;  . LOWER EXTREMITY ANGIOGRAM N/A 08/30/2013   Procedure: LOWER EXTREMITY ANGIOGRAM;  Surgeon: Runell Gess, MD;  Location[facility] CATH LAB;  Service: Cardiovascular;  Laterality: N/A;  . LOWER EXTREMITY ANGIOGRAM N/A 12/02/2013   Procedure: LOWER EXTREMITY ANGIOGRAM;  Surgeon: Runell Gess, MD;  Location: [facility] CATH LAB;  Service: Cardiovascular;  Laterality: N/A;  . RIGHT/LEFT HEART CATH AND CORONARY/GRAFT ANGIOGRAPHY N/A 04/23/2017   Procedure: RIGHT/LEFT HEART CATH AND CORONARY/GRAFT ANGIOGRAPHY;  Surgeon: Dolores Patty, MD;  Location: [facility] INVASIVE CV LAB;  Service: Cardiovascular;  Laterality: N/A;    Allergies  Allergen Reactions  . Pork-Derived Products Other (See Comments)    Religious preference     Outpatient Medications Prior to Visit  Medication Sig Dispense Refill  . ACCU-CHEK SOFTCLIX LANCETS lancets Use for once daily testing of blood sugar 100 each 12  .  acetaminophen-codeine (TYLENOL #3) 300-30 MG tablet TAKE 1 TABLET BY MOUTH AT BEDTIME AS NEEDED FOR MODERATE PAIN. 30 tablet 0  . albuterol (PROVENTIL) (2.5 MG/3ML) 0.083% nebulizer solution Take 3 mLs (2.5 mg total) by nebulization every 6 (six) hours as needed for wheezing or shortness of breath. 150 mL 1  . allopurinol (ZYLOPRIM) 100 MG tablet TAKE 2 TABLETS (200 MG TOTAL) BY MOUTH EVERY MORNING 60 tablet 3  .  atorvastatin (LIPITOR) 40 MG tablet TAKE 1 TABLET BY MOUTH EVERY EVENING AT 6 PM 30 tablet 0  . Blood Glucose Monitoring Suppl (ACCU-CHEK AVIVA) device Use as instructed1 times daily before meals 1 each 0  . cetirizine (ZYRTEC) 10 MG tablet TAKE 1 TABLET (10 MG TOTAL) BY MOUTH DAILY. 30 tablet 2  . diclofenac sodium (VOLTAREN) 1 % GEL APPLY 4 GRAMS TOPICALLY 4 (FOUR) TIMES DAILY 100 g 0  . digoxin (LANOXIN) 0.125 MG tablet TAKE 1/2 TABLET BY MOUTH DAILY 14 tablet 3  . gabapentin (NEURONTIN) 300 MG capsule TAKE 1 CAPSULE (300 MG TOTAL) BY MOUTH DAILY. 30 capsule 3  . glipiZIDE (GLUCOTROL) 10 MG tablet Take 1 tablet (10 mg total) by mouth 2 (two) times daily before a meal. 60 tablet 5  . glucose blood (ACCU-CHEK AVIVA) test strip Use as instructed for 1 times daily testing of blood sugar 100 each 12  . hydrALAZINE (APRESOLINE) 25 MG tablet TAKE 1 TABLET BY MOUTH THREE TIMES DAILY ( EVERY MORNING, NOON,EVENING ) 90 tablet 3  . Insulin Glargine (LANTUS) 100 UNIT/ML Solostar Pen Inject 50 Units into the skin daily at 10 pm. 5 pen 5  . isosorbide mononitrate (IMDUR) 30 MG 24 hr tablet Take 1 tablet (30 mg total) by mouth daily. 30 tablet 3  . Lancet Devices (ACCU-CHEK SOFTCLIX) lancets Use as instructed for once daily testing of blood sugar 1 each 0  . meclizine (ANTIVERT) 25 MG tablet TAKE 1 TABLET (25 MG TOTAL) BY MOUTH 3 (THREE) TIMES DAILY AS NEEDED FOR DIZZINESS. 60 tablet 0  . Misc. Devices MISC Shower chair.  Diagnosis: CHF 1 each 0  . potassium chloride SA (K-DUR,KLOR-CON) 20 MEQ tablet Take 2 tablets (40 mEq total) by mouth daily as needed. TAKE ONLY ON DAYS YOU TAKE METOLAZONE!! 60 tablet 11  . spironolactone (ALDACTONE) 25 MG tablet Take 0.5 tablets (12.5 mg total) by mouth daily. 30 tablet 3  . torsemide (DEMADEX) 20 MG tablet TAKE 4 TABLETS (80 MG TOTAL) BY MOUTH 2 (TWO) TIMES DAILY. 240 tablet 3  . traZODone (DESYREL) 100 MG tablet Take 1 tablet (100 mg total) by mouth at bedtime as needed  for sleep. 30 tablet 3  . VENTOLIN HFA 108 (90 Base) MCG/ACT inhaler INHALE 2 PUFFS INTO THE LUNGS EVERY 6 (SIX) HOURS AS NEEDED FOR WHEEZING OR SHORTNESS OF BREATH. 18 g 0  . XARELTO 20 MG TABS tablet TAKE ONE TABLET BY MOUTH ONCE DAILY 30 tablet 0  . metolazone (ZAROXOLYN) 2.5 MG tablet Take 1 tablet once as directed by CHF clinic. 10 tablet 0  . ergocalciferol (DRISDOL) 50000 units capsule Take 1 capsule (50,000 Units total) by mouth once a week. (Patient not taking: Reported on 03/19/2018) 4 capsule 1   No facility-administered medications prior to visit.     ROS Review of Systems  Constitutional: Negative for activity change and appetite change.  HENT: Negative for sinus pressure and sore throat.   Eyes: Negative for visual disturbance.  Respiratory: Positive for shortness of breath. Negative for cough and  chest tightness.   Cardiovascular: Negative for chest pain and leg swelling.  Gastrointestinal: Negative for abdominal distention, abdominal pain, constipation and diarrhea.  Endocrine: Negative.   Genitourinary: Negative for dysuria.  Musculoskeletal: Negative for joint swelling and myalgias.  Skin: Negative for rash.  Allergic/Immunologic: Negative.   Neurological: Negative for weakness, light-headedness and numbness.  Psychiatric/Behavioral: Negative for dysphoric mood and suicidal ideas.    Objective:  BP 101/65   Pulse (!) 124   Temp (!) 97.3 F (36.3 C) (Oral)   Ht 5\' 5"  (1.651 m)   Wt 231 lb (104.8 kg)   SpO2 100%   BMI 38.44 kg/m   BP/Weight 03/19/2018 03/12/2018 02/26/2018  Systolic BP 101 122 120  Diastolic BP 65 70 70  Wt. (Lbs) 231 222.2 228  BMI 38.44 36.98 37.94     Physical Exam Constitutional:      Appearance: He is well-developed.  Neck:     Vascular: No JVD.  Cardiovascular:     Rate and Rhythm: Tachycardia present. Rhythm irregularly irregular.     Heart sounds: Normal heart sounds. No murmur.     Comments: Unable to palpate dorsalis pedis  bilaterally Pulmonary:     Effort: Pulmonary effort is normal.     Breath sounds: Normal breath sounds. No wheezing or rales.  Chest:     Chest wall: No tenderness.  Abdominal:     General: Bowel sounds are normal. There is no distension.     Palpations: Abdomen is soft. There is no mass.     Tenderness: There is no abdominal tenderness.  Musculoskeletal: Normal range of motion.  Neurological:     Mental Status: He is alert and oriented to person, place, and time.     Lab Results  Component Value Date   HGBA1C 6.5 03/19/2018    Assessment & Plan:   1. DM (diabetes mellitus), type 2 with peripheral vascular complications (HCC) Controlled with A1c of 6.5 No evidence of hypoglycemia Continue current regimen - POCT glucose (manual entry) - POCT glycosylated hemoglobin (Hb A1C) - Ambulatory referral to Podiatry  2. Morbid (severe) obesity due to excess calories (HCC) Discussed the need to cut back on caloric intake and increase physical activity  3. Avulsion of toenail, initial encounter We will cover with antibiotic prophylactically given comorbid conditions - cephALEXin (KEFLEX) 500 MG capsule; Take 1 capsule (500 mg total) by mouth 2 (two) times daily.  Dispense: 20 capsule; Refill: 0 - Ambulatory referral to Podiatry  4. Atrial fibrillation with rapid ventricular response (HCC) Uncontrolled Declined cardioversion as per cardiology note Continue anticoagulation with Eliquis  5. Chronic systolic congestive heart failure (HCC) EF 20 to 25% from echo of 08/2017 Complaints of worsening dyspnea even though clinical exam does not reveal fluid overload Refilled metolazone which he takes as needed Continue torsemide  6. Cardiomyopathy, ischemic See #5 above - metolazone (ZAROXOLYN) 2.5 MG tablet; Take 1 tablet once as directed by CHF clinic.  Dispense: 10 tablet; Refill: 0  Advised to quit smoking - he is non chalant about this.  Meds ordered this encounter  Medications    . cephALEXin (KEFLEX) 500 MG capsule    Sig: Take 1 capsule (500 mg total) by mouth 2 (two) times daily.    Dispense:  20 capsule    Refill:  0  . metolazone (ZAROXOLYN) 2.5 MG tablet    Sig: Take 1 tablet once as directed by CHF clinic.    Dispense:  10 tablet    Refill:  0    Follow-up: Return in about 3 months (around 06/18/2018) for Follow-up of chronic medical conditions.   Hoy Register MD

## 2018-03-19 NOTE — Telephone Encounter (Signed)
Call received from patient's personal care aide stating that the patient's toe nail feel off and he is concerned about complications because he is diabetic. No signs of infection reported.  He had an appointment scheduled at [facility] for tomorrow but that was cancelled when he scheduled a walk in appointment last week. He is requesting an appointment for today and said that he will need transportation. He has medicaid but is not able to schedule transportation at last minute notice.   Informed him that Dr Alvis Lemmings would see him this morning but he needed to be ready for cab pick up now.  He said that he would be ready. Cab transportation ordered by Aldean Jewett, [facility] Front office team lead

## 2018-03-19 NOTE — Progress Notes (Signed)
Patient has missing toe nail on big toe of right foot.

## 2018-03-20 ENCOUNTER — Ambulatory Visit: Payer: Medicare Other

## 2018-03-27 ENCOUNTER — Inpatient Hospital Stay (HOSPITAL_COMMUNITY)
Admission: EM | Admit: 2018-03-27 | Discharge: 2018-03-30 | DRG: 377 | Discharge status: Against Medical Advice | Payer: Medicare Other | Attending: Internal Medicine | Admitting: Internal Medicine

## 2018-03-27 ENCOUNTER — Emergency Department (HOSPITAL_COMMUNITY): Payer: Medicare Other

## 2018-03-27 ENCOUNTER — Other Ambulatory Visit: Payer: Self-pay

## 2018-03-27 ENCOUNTER — Encounter (HOSPITAL_COMMUNITY): Payer: Self-pay

## 2018-03-27 ENCOUNTER — Telehealth (HOSPITAL_COMMUNITY): Payer: Self-pay

## 2018-03-27 DIAGNOSIS — K2951 Unspecified chronic gastritis with bleeding: Secondary | ICD-10-CM | POA: Diagnosis present

## 2018-03-27 DIAGNOSIS — I482 Chronic atrial fibrillation, unspecified: Secondary | ICD-10-CM | POA: Diagnosis present

## 2018-03-27 DIAGNOSIS — I4821 Permanent atrial fibrillation: Secondary | ICD-10-CM | POA: Diagnosis not present

## 2018-03-27 DIAGNOSIS — Z794 Long term (current) use of insulin: Secondary | ICD-10-CM

## 2018-03-27 DIAGNOSIS — K5521 Angiodysplasia of colon with hemorrhage: Secondary | ICD-10-CM | POA: Diagnosis present

## 2018-03-27 DIAGNOSIS — R079 Chest pain, unspecified: Secondary | ICD-10-CM | POA: Diagnosis not present

## 2018-03-27 DIAGNOSIS — E1165 Type 2 diabetes mellitus with hyperglycemia: Secondary | ICD-10-CM | POA: Diagnosis not present

## 2018-03-27 DIAGNOSIS — I4891 Unspecified atrial fibrillation: Secondary | ICD-10-CM | POA: Diagnosis present

## 2018-03-27 DIAGNOSIS — Z951 Presence of aortocoronary bypass graft: Secondary | ICD-10-CM

## 2018-03-27 DIAGNOSIS — I255 Ischemic cardiomyopathy: Secondary | ICD-10-CM | POA: Diagnosis present

## 2018-03-27 DIAGNOSIS — E1140 Type 2 diabetes mellitus with diabetic neuropathy, unspecified: Secondary | ICD-10-CM | POA: Diagnosis present

## 2018-03-27 DIAGNOSIS — K264 Chronic or unspecified duodenal ulcer with hemorrhage: Secondary | ICD-10-CM | POA: Diagnosis present

## 2018-03-27 DIAGNOSIS — D509 Iron deficiency anemia, unspecified: Secondary | ICD-10-CM | POA: Diagnosis present

## 2018-03-27 DIAGNOSIS — I739 Peripheral vascular disease, unspecified: Secondary | ICD-10-CM | POA: Diagnosis not present

## 2018-03-27 DIAGNOSIS — I5022 Chronic systolic (congestive) heart failure: Secondary | ICD-10-CM | POA: Diagnosis present

## 2018-03-27 DIAGNOSIS — R0602 Shortness of breath: Secondary | ICD-10-CM | POA: Diagnosis not present

## 2018-03-27 DIAGNOSIS — Z8711 Personal history of peptic ulcer disease: Secondary | ICD-10-CM

## 2018-03-27 DIAGNOSIS — F17210 Nicotine dependence, cigarettes, uncomplicated: Secondary | ICD-10-CM | POA: Diagnosis present

## 2018-03-27 DIAGNOSIS — I5023 Acute on chronic systolic (congestive) heart failure: Secondary | ICD-10-CM

## 2018-03-27 DIAGNOSIS — B9681 Helicobacter pylori [H. pylori] as the cause of diseases classified elsewhere: Secondary | ICD-10-CM | POA: Diagnosis present

## 2018-03-27 DIAGNOSIS — K2950 Unspecified chronic gastritis without bleeding: Secondary | ICD-10-CM | POA: Diagnosis not present

## 2018-03-27 DIAGNOSIS — R0789 Other chest pain: Secondary | ICD-10-CM | POA: Diagnosis not present

## 2018-03-27 DIAGNOSIS — I5020 Unspecified systolic (congestive) heart failure: Secondary | ICD-10-CM | POA: Diagnosis not present

## 2018-03-27 DIAGNOSIS — Z7901 Long term (current) use of anticoagulants: Secondary | ICD-10-CM | POA: Diagnosis not present

## 2018-03-27 DIAGNOSIS — D62 Acute posthemorrhagic anemia: Secondary | ICD-10-CM | POA: Diagnosis present

## 2018-03-27 DIAGNOSIS — J811 Chronic pulmonary edema: Secondary | ICD-10-CM | POA: Diagnosis not present

## 2018-03-27 DIAGNOSIS — I2510 Atherosclerotic heart disease of native coronary artery without angina pectoris: Secondary | ICD-10-CM | POA: Diagnosis present

## 2018-03-27 DIAGNOSIS — K766 Portal hypertension: Secondary | ICD-10-CM | POA: Diagnosis present

## 2018-03-27 DIAGNOSIS — E1151 Type 2 diabetes mellitus with diabetic peripheral angiopathy without gangrene: Secondary | ICD-10-CM

## 2018-03-27 DIAGNOSIS — I70219 Atherosclerosis of native arteries of extremities with intermittent claudication, unspecified extremity: Secondary | ICD-10-CM | POA: Diagnosis present

## 2018-03-27 DIAGNOSIS — E1122 Type 2 diabetes mellitus with diabetic chronic kidney disease: Secondary | ICD-10-CM | POA: Diagnosis present

## 2018-03-27 DIAGNOSIS — K3189 Other diseases of stomach and duodenum: Secondary | ICD-10-CM | POA: Diagnosis present

## 2018-03-27 DIAGNOSIS — Z955 Presence of coronary angioplasty implant and graft: Secondary | ICD-10-CM

## 2018-03-27 DIAGNOSIS — E785 Hyperlipidemia, unspecified: Secondary | ICD-10-CM | POA: Diagnosis present

## 2018-03-27 DIAGNOSIS — K259 Gastric ulcer, unspecified as acute or chronic, without hemorrhage or perforation: Secondary | ICD-10-CM | POA: Diagnosis not present

## 2018-03-27 DIAGNOSIS — K269 Duodenal ulcer, unspecified as acute or chronic, without hemorrhage or perforation: Secondary | ICD-10-CM | POA: Diagnosis not present

## 2018-03-27 DIAGNOSIS — Z91018 Allergy to other foods: Secondary | ICD-10-CM

## 2018-03-27 DIAGNOSIS — N183 Chronic kidney disease, stage 3 (moderate): Secondary | ICD-10-CM | POA: Diagnosis present

## 2018-03-27 DIAGNOSIS — I252 Old myocardial infarction: Secondary | ICD-10-CM

## 2018-03-27 DIAGNOSIS — I70211 Atherosclerosis of native arteries of extremities with intermittent claudication, right leg: Secondary | ICD-10-CM | POA: Diagnosis not present

## 2018-03-27 DIAGNOSIS — I130 Hypertensive heart and chronic kidney disease with heart failure and stage 1 through stage 4 chronic kidney disease, or unspecified chronic kidney disease: Secondary | ICD-10-CM | POA: Diagnosis present

## 2018-03-27 DIAGNOSIS — Z596 Low income: Secondary | ICD-10-CM

## 2018-03-27 DIAGNOSIS — E669 Obesity, unspecified: Secondary | ICD-10-CM | POA: Diagnosis present

## 2018-03-27 DIAGNOSIS — N179 Acute kidney failure, unspecified: Secondary | ICD-10-CM | POA: Diagnosis present

## 2018-03-27 DIAGNOSIS — I10 Essential (primary) hypertension: Secondary | ICD-10-CM

## 2018-03-27 DIAGNOSIS — Z720 Tobacco use: Secondary | ICD-10-CM

## 2018-03-27 DIAGNOSIS — R05 Cough: Secondary | ICD-10-CM | POA: Diagnosis not present

## 2018-03-27 DIAGNOSIS — I509 Heart failure, unspecified: Secondary | ICD-10-CM

## 2018-03-27 DIAGNOSIS — M109 Gout, unspecified: Secondary | ICD-10-CM | POA: Diagnosis present

## 2018-03-27 DIAGNOSIS — J90 Pleural effusion, not elsewhere classified: Secondary | ICD-10-CM | POA: Diagnosis not present

## 2018-03-27 DIAGNOSIS — Z79899 Other long term (current) drug therapy: Secondary | ICD-10-CM

## 2018-03-27 DIAGNOSIS — Z95810 Presence of automatic (implantable) cardiac defibrillator: Secondary | ICD-10-CM

## 2018-03-27 DIAGNOSIS — K31819 Angiodysplasia of stomach and duodenum without bleeding: Secondary | ICD-10-CM | POA: Diagnosis not present

## 2018-03-27 DIAGNOSIS — Z833 Family history of diabetes mellitus: Secondary | ICD-10-CM

## 2018-03-27 DIAGNOSIS — D500 Iron deficiency anemia secondary to blood loss (chronic): Secondary | ICD-10-CM | POA: Diagnosis not present

## 2018-03-27 DIAGNOSIS — H409 Unspecified glaucoma: Secondary | ICD-10-CM | POA: Diagnosis present

## 2018-03-27 DIAGNOSIS — I110 Hypertensive heart disease with heart failure: Secondary | ICD-10-CM | POA: Diagnosis not present

## 2018-03-27 DIAGNOSIS — D649 Anemia, unspecified: Secondary | ICD-10-CM

## 2018-03-27 DIAGNOSIS — Z6839 Body mass index (BMI) 39.0-39.9, adult: Secondary | ICD-10-CM

## 2018-03-27 DIAGNOSIS — E871 Hypo-osmolality and hyponatremia: Secondary | ICD-10-CM | POA: Diagnosis present

## 2018-03-27 LAB — IRON AND TIBC
IRON: 19 ug/dL — AB (ref 45–182)
Saturation Ratios: 5 % — ABNORMAL LOW (ref 17.9–39.5)
TIBC: 395 ug/dL (ref 250–450)
UIBC: 376 ug/dL

## 2018-03-27 LAB — URINALYSIS, ROUTINE W REFLEX MICROSCOPIC
Bilirubin Urine: NEGATIVE
Glucose, UA: NEGATIVE mg/dL
Hgb urine dipstick: NEGATIVE
Ketones, ur: NEGATIVE mg/dL
LEUKOCYTES UA: NEGATIVE
NITRITE: NEGATIVE
Protein, ur: NEGATIVE mg/dL
Specific Gravity, Urine: 1.006 (ref 1.005–1.030)
pH: 5 (ref 5.0–8.0)

## 2018-03-27 LAB — CBC WITH DIFFERENTIAL/PLATELET
BASOS ABS: 0.2 10*3/uL — AB (ref 0.0–0.1)
BASOS PCT: 3 %
Band Neutrophils: 0 %
Eosinophils Absolute: 0 10*3/uL (ref 0.0–0.5)
Eosinophils Relative: 0 %
HCT: 21.3 % — ABNORMAL LOW (ref 39.0–52.0)
Hemoglobin: 5.3 g/dL — CL (ref 13.0–17.0)
Lymphocytes Relative: 9 %
Lymphs Abs: 0.6 10*3/uL — ABNORMAL LOW (ref 0.7–4.0)
MCH: 16.6 pg — ABNORMAL LOW (ref 26.0–34.0)
MCHC: 24.9 g/dL — ABNORMAL LOW (ref 30.0–36.0)
MCV: 66.6 fL — ABNORMAL LOW (ref 80.0–100.0)
MYELOCYTES: 2 %
Monocytes Absolute: 0.1 10*3/uL (ref 0.1–1.0)
Monocytes Relative: 1 %
NEUTROS PCT: 85 %
Neutro Abs: 5.4 10*3/uL (ref 1.7–7.7)
Platelets: 244 10*3/uL (ref 150–400)
RBC: 3.2 MIL/uL — ABNORMAL LOW (ref 4.22–5.81)
RDW: 24 % — ABNORMAL HIGH (ref 11.5–15.5)
WBC: 6.4 10*3/uL (ref 4.0–10.5)
nRBC: 0.6 % — ABNORMAL HIGH (ref 0.0–0.2)
nRBC: 1 /100 WBC — ABNORMAL HIGH

## 2018-03-27 LAB — MAGNESIUM: Magnesium: 2 mg/dL (ref 1.7–2.4)

## 2018-03-27 LAB — POC OCCULT BLOOD, ED: Fecal Occult Bld: NEGATIVE

## 2018-03-27 LAB — FERRITIN: Ferritin: 8 ng/mL — ABNORMAL LOW (ref 24–336)

## 2018-03-27 LAB — GLUCOSE, CAPILLARY
Glucose-Capillary: 63 mg/dL — ABNORMAL LOW (ref 70–99)
Glucose-Capillary: 270 mg/dL — ABNORMAL HIGH (ref 70–99)

## 2018-03-27 LAB — BASIC METABOLIC PANEL
ANION GAP: 9 (ref 5–15)
BUN: 37 mg/dL — ABNORMAL HIGH (ref 8–23)
CALCIUM: 8.3 mg/dL — AB (ref 8.9–10.3)
CO2: 25 mmol/L (ref 22–32)
Chloride: 96 mmol/L — ABNORMAL LOW (ref 98–111)
Creatinine, Ser: 1.68 mg/dL — ABNORMAL HIGH (ref 0.61–1.24)
GFR calc non Af Amer: 43 mL/min — ABNORMAL LOW (ref >60)
GFR, EST AFRICAN AMERICAN: 50 mL/min — AB (ref >60)
Glucose, Bld: 168 mg/dL — ABNORMAL HIGH (ref 70–99)
Potassium: 3.5 mmol/L (ref 3.5–5.1)
Sodium: 130 mmol/L — ABNORMAL LOW (ref 135–145)

## 2018-03-27 LAB — PREPARE RBC (CROSSMATCH)

## 2018-03-27 LAB — BRAIN NATRIURETIC PEPTIDE: B Natriuretic Peptide: 499.7 pg/mL — ABNORMAL HIGH (ref 0.0–100.0)

## 2018-03-27 LAB — RAPID URINE DRUG SCREEN, HOSP PERFORMED
Amphetamines: NOT DETECTED
Barbiturates: NOT DETECTED
Benzodiazepines: NOT DETECTED
COCAINE: NOT DETECTED
OPIATES: NOT DETECTED
Tetrahydrocannabinol: NOT DETECTED

## 2018-03-27 LAB — I-STAT TROPONIN, ED: Troponin i, poc: 0.02 ng/mL (ref 0.00–0.08)

## 2018-03-27 LAB — VITAMIN B12: Vitamin B-12: 861 pg/mL (ref 180–914)

## 2018-03-27 LAB — RETICULOCYTES
Immature Retic Fract: 19.9 % — ABNORMAL HIGH (ref 2.3–15.9)
RBC.: 3.02 MIL/uL — ABNORMAL LOW (ref 4.22–5.81)
Retic Count, Absolute: 68 10*3/uL (ref 19.0–186.0)
Retic Ct Pct: 2.3 % (ref 0.4–3.1)

## 2018-03-27 LAB — FOLATE: Folate: 15.9 ng/mL (ref >5.9)

## 2018-03-27 LAB — TROPONIN I: Troponin I: 0.04 ng/mL (ref <0.03)

## 2018-03-27 LAB — TSH: TSH: 4.191 u[IU]/mL (ref 0.350–4.500)

## 2018-03-27 MED ORDER — INSULIN GLARGINE 100 UNIT/ML ~~LOC~~ SOLN
30.0000 [IU] | Freq: Every day | SUBCUTANEOUS | Status: DC
  Administered 2018-03-27 – 2018-03-29 (×3): 30 [IU] via SUBCUTANEOUS
  Filled 2018-03-27 (×3): qty 0.3

## 2018-03-27 MED ORDER — SODIUM CHLORIDE 0.9 % IV SOLN: 250.0000 mL | INTRAVENOUS | Status: DC | PRN

## 2018-03-27 MED ORDER — ALPRAZOLAM 0.25 MG PO TABS
0.2500 mg | ORAL_TABLET | Freq: Two times a day (BID) | ORAL | Status: DC | PRN
  Administered 2018-03-28 – 2018-03-29 (×2): 0.25 mg via ORAL
  Filled 2018-03-27 (×2): qty 1

## 2018-03-27 MED ORDER — TRAZODONE HCL 100 MG PO TABS
100.0000 mg | ORAL_TABLET | Freq: Every evening | ORAL | Status: DC | PRN
  Administered 2018-03-29: 100 mg via ORAL
  Filled 2018-03-27: qty 1

## 2018-03-27 MED ORDER — INSULIN ASPART 100 UNIT/ML ~~LOC~~ SOLN
0.0000 [IU] | Freq: Three times a day (TID) | SUBCUTANEOUS | Status: DC
  Administered 2018-03-28: 3 [IU] via SUBCUTANEOUS
  Administered 2018-03-28: 5 [IU] via SUBCUTANEOUS
  Administered 2018-03-28 – 2018-03-30 (×3): 3 [IU] via SUBCUTANEOUS

## 2018-03-27 MED ORDER — SODIUM CHLORIDE 0.9% FLUSH: 3.0000 mL | INTRAVENOUS | Status: DC | PRN

## 2018-03-27 MED ORDER — INSULIN ASPART 100 UNIT/ML ~~LOC~~ SOLN
0.0000 [IU] | Freq: Every day | SUBCUTANEOUS | Status: DC
  Administered 2018-03-27: 3 [IU] via SUBCUTANEOUS

## 2018-03-27 MED ORDER — ISOSORBIDE MONONITRATE ER 30 MG PO TB24
30.0000 mg | ORAL_TABLET | Freq: Every day | ORAL | Status: DC
  Administered 2018-03-27 – 2018-03-30 (×4): 30 mg via ORAL
  Filled 2018-03-27 (×4): qty 1

## 2018-03-27 MED ORDER — SODIUM CHLORIDE 0.9% FLUSH
3.0000 mL | Freq: Two times a day (BID) | INTRAVENOUS | Status: DC
  Administered 2018-03-27 – 2018-03-28 (×2): 3 mL via INTRAVENOUS

## 2018-03-27 MED ORDER — ATORVASTATIN CALCIUM 40 MG PO TABS
40.0000 mg | ORAL_TABLET | Freq: Every day | ORAL | Status: DC
  Administered 2018-03-27 – 2018-03-30 (×4): 40 mg via ORAL
  Filled 2018-03-27 (×4): qty 1

## 2018-03-27 MED ORDER — DIGOXIN 125 MCG PO TABS
62.5000 ug | ORAL_TABLET | Freq: Every day | ORAL | Status: DC
  Administered 2018-03-27 – 2018-03-30 (×4): 62.5 ug via ORAL
  Filled 2018-03-27 (×4): qty 1

## 2018-03-27 MED ORDER — LEVALBUTEROL HCL 0.63 MG/3ML IN NEBU
0.6300 mg | INHALATION_SOLUTION | Freq: Four times a day (QID) | RESPIRATORY_TRACT | Status: DC
  Administered 2018-03-27 (×2): 0.63 mg via RESPIRATORY_TRACT
  Filled 2018-03-27 (×2): qty 3

## 2018-03-27 MED ORDER — ACETAMINOPHEN 325 MG PO TABS
650.0000 mg | ORAL_TABLET | ORAL | Status: DC | PRN
  Administered 2018-03-27 – 2018-03-29 (×3): 650 mg via ORAL
  Filled 2018-03-27 (×3): qty 2

## 2018-03-27 MED ORDER — IPRATROPIUM BROMIDE 0.02 % IN SOLN
0.5000 mg | Freq: Three times a day (TID) | RESPIRATORY_TRACT | Status: DC
  Administered 2018-03-28: 0.5 mg via RESPIRATORY_TRACT
  Filled 2018-03-27: qty 2.5

## 2018-03-27 MED ORDER — FUROSEMIDE 10 MG/ML IJ SOLN
40.0000 mg | Freq: Every day | INTRAMUSCULAR | Status: DC
  Administered 2018-03-27 – 2018-03-28 (×2): 40 mg via INTRAVENOUS
  Filled 2018-03-27 (×2): qty 4

## 2018-03-27 MED ORDER — LATANOPROST 0.005 % OP SOLN
1.0000 [drp] | Freq: Every day | OPHTHALMIC | Status: DC
  Administered 2018-03-28 – 2018-03-30 (×3): 1 [drp] via OPHTHALMIC
  Filled 2018-03-27: qty 2.5

## 2018-03-27 MED ORDER — FUROSEMIDE 10 MG/ML IJ SOLN
20.0000 mg | Freq: Once | INTRAMUSCULAR | Status: AC
  Administered 2018-03-27: 20 mg via INTRAVENOUS
  Filled 2018-03-27: qty 2

## 2018-03-27 MED ORDER — ONDANSETRON HCL 4 MG/2ML IJ SOLN
4.0000 mg | Freq: Four times a day (QID) | INTRAMUSCULAR | Status: DC | PRN
  Administered 2018-03-28: 4 mg via INTRAVENOUS
  Filled 2018-03-27: qty 2

## 2018-03-27 MED ORDER — LEVALBUTEROL HCL 0.63 MG/3ML IN NEBU
0.6300 mg | INHALATION_SOLUTION | Freq: Three times a day (TID) | RESPIRATORY_TRACT | Status: DC
  Administered 2018-03-28: 0.63 mg via RESPIRATORY_TRACT
  Filled 2018-03-27: qty 3

## 2018-03-27 MED ORDER — SPIRONOLACTONE 12.5 MG HALF TABLET
12.5000 mg | ORAL_TABLET | Freq: Every day | ORAL | Status: DC
  Administered 2018-03-27 – 2018-03-29 (×3): 12.5 mg via ORAL
  Filled 2018-03-27 (×4): qty 1

## 2018-03-27 MED ORDER — MECLIZINE HCL 25 MG PO TABS: 25.0000 mg | ORAL_TABLET | Freq: Three times a day (TID) | ORAL | Status: DC | PRN

## 2018-03-27 MED ORDER — IPRATROPIUM BROMIDE 0.02 % IN SOLN
0.5000 mg | Freq: Four times a day (QID) | RESPIRATORY_TRACT | Status: DC
  Administered 2018-03-27 (×2): 0.5 mg via RESPIRATORY_TRACT
  Filled 2018-03-27 (×2): qty 2.5

## 2018-03-27 MED ORDER — SODIUM CHLORIDE 0.9% IV SOLUTION: Freq: Once | INTRAVENOUS | Status: DC

## 2018-03-27 MED ORDER — FUROSEMIDE 10 MG/ML IJ SOLN
20.0000 mg | Freq: Once | INTRAMUSCULAR | Status: AC
  Administered 2018-03-28: 20 mg via INTRAVENOUS
  Filled 2018-03-27: qty 2

## 2018-03-27 NOTE — ED Notes (Signed)
Hospitalist bedside 

## 2018-03-27 NOTE — ED Notes (Signed)
Jacob [name], son's contact info: [phone] Please call with any questions or concerns

## 2018-03-27 NOTE — Telephone Encounter (Signed)
Noted  

## 2018-03-27 NOTE — H&P (Signed)
History and Physical    Jacob [name] EPP:[id number] DOB: Jul 18, 1955 DOA: 03/27/2018  PCP: Hoy Register, MD   Patient coming from: Home  Chief Complaint: SOB, Chest Pain, and Heart Fluttering  HPI: Jacob [name] is a 63 y.o. male with medical history significant of full medical comorbidities including atrial fibrillation,, history of AICD placement, systolic CHF, chronic anticoagulation with Xarelto due to atrial fibrillation, hyperlipidemia, hypertension, history of MI, type 2 diabetes mellitus on insulin, history of noncompliance, hypertension, history of GI bleed and gastric hemorrhage and duodenal ulceration, as well as other comorbidities who presents with a chief complaint of shortness of breath, chest pain and chest fluttering.  Patient is nondescript on his symptoms and states that his shortness of breath is progressively gotten worse but in the last 2 days significantly worsened.  He states last night he started having some chest pain and fluttering of his heart.  Also states that he had some sharp abdominal pain which is resolved.  He got concerned because of his chest pain and shortness of breath.  He states that he woke up from sleep with heaviness shortness of breath and intermittent chest pain.  He states the chest pain has been intermittent and severe with no specific alleviating or aggravating factors.  He took home aspirin.  He has not noticed any blood in his stool or his urine has not been vomiting or been nauseous.  He states that he had symptoms a few weeks ago and his PCP approximately a week ago.  Went to the CHF clinic on 02/26/2018  ED Course: In the ED had basic blood work done and was typed and screened and started transfusion of 2 units of pRBCs. Fecal occult was negative and chest x-ray did show some vascular congestion.  EKG was also done.  EP to contact cardiology about this patient  Review of Systems: As per HPI otherwise 10 point review of systems negative.   Past Medical  History:  Diagnosis Date  . Atrial fibrillation (HCC)    RVR 10/2014  . Automatic implantable cardioverter-defibrillator in situ   . CAD (coronary artery disease) Sept 2013   s/p cardiac cath showing occlusion of small RCA with collaterals  . CHF (congestive heart failure) (HCC)    20 to 25 % EF and RV dysfunction by 07/2014 echo   . Chronic anticoagulation    on xarelto.   . High cholesterol   . Hypertension   . Myocardial infarction (HCC) 2014  . Noncompliance    homelessness contributing.   . Peripheral arterial disease (HCC)   . Type II diabetes mellitus (HCC)    Past Surgical History:  Procedure Laterality Date  . CARDIAC CATHETERIZATION  09/2012  . CORONARY ANGIOPLASTY WITH STENT PLACEMENT  11/2011   "1"  . CORONARY ARTERY BYPASS GRAFT  09/2012   2 vessels per patient [name]   . ESOPHAGOGASTRODUODENOSCOPY N/A 11/28/2014   Procedure: ESOPHAGOGASTRODUODENOSCOPY (EGD);  Surgeon: Beverley Fiedler, MD;  Location: [facility] ENDOSCOPY;  Service: Endoscopy;  Laterality: N/A;  . ILIAC ARTERY STENT Right 08/30/2013  . IMPLANTABLE CARDIOVERTER DEFIBRILLATOR IMPLANT     [place] in 07/2012; [employer]  . LEFT AND RIGHT HEART CATHETERIZATION WITH CORONARY ANGIOGRAM N/A 12/02/2011   Procedure: LEFT AND RIGHT HEART CATHETERIZATION WITH CORONARY ANGIOGRAM;  Surgeon: Kathleene Hazel, MD;  Location: [facility] CATH LAB;  Service: Cardiovascular;  Laterality: N/A;  . LOWER EXTREMITY ANGIOGRAM N/A 08/30/2013   Procedure: LOWER EXTREMITY ANGIOGRAM;  Surgeon: Runell Gess, MD;  Location:  [facility] CATH LAB;  Service: Cardiovascular;  Laterality: N/A;  . LOWER EXTREMITY ANGIOGRAM N/A 12/02/2013   Procedure: LOWER EXTREMITY ANGIOGRAM;  Surgeon: Runell Gess, MD;  Location: [facility] CATH LAB;  Service: Cardiovascular;  Laterality: N/A;  . RIGHT/LEFT HEART CATH AND CORONARY/GRAFT ANGIOGRAPHY N/A 04/23/2017   Procedure: RIGHT/LEFT HEART CATH AND CORONARY/GRAFT ANGIOGRAPHY;  Surgeon: Dolores Patty, MD;  Location: [facility]  INVASIVE CV LAB;  Service: Cardiovascular;  Laterality: N/A;   SOCIAL HISTORY  reports that he has been smoking cigarettes. He has a 20.00 pack-year smoking history. He has never used smokeless tobacco. He reports that he does not drink alcohol or use drugs.  Allergies  Allergen Reactions  . Pork-Derived Products Other (See Comments)    Religious preference   Family History  Problem Relation Age of Onset  . Diabetes Mother    Prior to Admission medications   Medication Sig Start Date End Date Taking? Authorizing Provider  cephALEXin (KEFLEX) 500 MG capsule Take 1 capsule (500 mg total) by mouth 2 (two) times daily. 03/19/18  Yes Hoy Register, MD  meclizine (ANTIVERT) 25 MG tablet TAKE 1 TABLET (25 MG TOTAL) BY MOUTH 3 (THREE) TIMES DAILY AS NEEDED FOR DIZZINESS. 02/06/18  Yes Hoy Register, MD  metolazone (ZAROXOLYN) 2.5 MG tablet Take 1 tablet once as directed by CHF clinic. 03/19/18  Yes Hoy Register, MD  traZODone (DESYREL) 100 MG tablet Take 1 tablet (100 mg total) by mouth at bedtime as needed for sleep. 03/19/18  Yes Newlin, Enobong, MD  VENTOLIN HFA 108 (90 Base) MCG/ACT inhaler INHALE 2 PUFFS INTO THE LUNGS EVERY 6 (SIX) HOURS AS NEEDED FOR WHEEZING OR SHORTNESS OF BREATH. 03/04/18  Yes Newlin, Enobong, MD  XARELTO 20 MG TABS tablet TAKE ONE TABLET BY MOUTH ONCE DAILY Patient taking differently: Take 20 mg by mouth daily with supper.  03/04/18  Yes Hoy Register, MD  ACCU-CHEK SOFTCLIX LANCETS lancets Use for once daily testing of blood sugar 06/02/17   Newlin, Odette Horns, MD  acetaminophen-codeine (TYLENOL #3) 300-30 MG tablet TAKE 1 TABLET BY MOUTH AT BEDTIME AS NEEDED FOR MODERATE PAIN. 09/09/17   Hoy Register, MD  albuterol (PROVENTIL) (2.5 MG/3ML) 0.083% nebulizer solution Take 3 mLs (2.5 mg total) by nebulization every 6 (six) hours as needed for wheezing or shortness of breath. 04/18/15   Hoy Register, MD  allopurinol (ZYLOPRIM) 100 MG tablet TAKE 2 TABLETS (200 MG  TOTAL) BY MOUTH EVERY MORNING 03/19/18   Hoy Register, MD  atorvastatin (LIPITOR) 40 MG tablet TAKE 1 TABLET BY MOUTH EVERY EVENING AT 6 PM 03/19/18   Hoy Register, MD  Blood Glucose Monitoring Suppl (ACCU-CHEK AVIVA) device Use as instructed1 times daily before meals 06/02/17   Hoy Register, MD  cetirizine (ZYRTEC) 10 MG tablet TAKE 1 TABLET (10 MG TOTAL) BY MOUTH DAILY. 02/06/18   Hoy Register, MD  diclofenac sodium (VOLTAREN) 1 % GEL APPLY 4 GRAMS TOPICALLY 4 (FOUR) TIMES DAILY 11/17/17   Hoy Register, MD  digoxin (LANOXIN) 0.125 MG tablet Take 0.5 tablets (62.5 mcg total) by mouth daily. 03/19/18   Hoy Register, MD  ergocalciferol (DRISDOL) 50000 units capsule Take 1 capsule (50,000 Units total) by mouth once a week. Patient not taking: Reported on 03/19/2018 06/03/17   Hoy Register, MD  gabapentin (NEURONTIN) 300 MG capsule Take 1 capsule (300 mg total) by mouth daily. 03/19/18   Hoy Register, MD  glipiZIDE (GLUCOTROL) 10 MG tablet Take 1 tablet (10 mg total) by mouth 2 (two) times daily before  a meal. 03/19/18   [name], Enobong, MD  glucose blood (ACCU-CHEK AVIVA) test strip Use as instructed for 1 times daily testing of blood sugar 06/02/17   [name], Enobong, MD  hydrALAZINE (APRESOLINE) 25 MG tablet TAKE 1 TABLET BY MOUTH THREE TIMES DAILY ( EVERY MORNING, NOON,EVENING ) 03/19/18   [name], Enobong, MD  Insulin Glargine (LANTUS) 100 UNIT/ML Solostar Pen Inject 48 Units into the skin daily at 10 pm. 03/19/18   [name], Enobong, MD  isosorbide mononitrate (IMDUR) 30 MG 24 hr tablet Take 1 tablet (30 mg total) by mouth daily. 03/19/18   [name], Enobong, MD  Lancet Devices [facility](ACCU-CHEK SOFTCLIX) lancets Use as instructed for once daily testing of blood sugar 11/28/15   [name], Enobong, MD  Misc. Devices MISC Shower chair.  Diagnosis: CHF 02/18/18   [name], Enobong, MD  potassium chloride SA (K-DUR,KLOR-CON) 20 MEQ tablet Take 2 tablets (40 mEq total) by mouth daily as needed. TAKE  ONLY ON DAYS YOU TAKE METOLAZONE!! 09/30/17   Alford HighlandSmith, Ashley M, NP  spironolactone (ALDACTONE) 25 MG tablet Take 0.5 tablets (12.5 mg total) by mouth daily. 03/19/18   [name], Enobong, MD  torsemide (DEMADEX) 20 MG tablet Take 4 tablets (80 mg total) by mouth 2 (two) times daily. 03/19/18   [name], Enobong, MD   Physical Exam: Vitals:   03/27/18 1630 03/27/18 1715 03/27/18 2010 03/27/18 2048  BP: 117/75 126/76 117/61   Pulse: 93  (!) 101   Resp: 17  16   Temp: 97.6 F (36.4 C) 97.8 F (36.6 C) 97.6 F (36.4 C)   TempSrc: Oral Oral Oral   SpO2: 99% 96% 95% 96%  Weight:  107.3 kg    Height:  5\' 5"  (1.651 m)     Constitutional: WN/WD obese male NAD and appears anxious and uncomfortable Eyes: Lids and conjunctivae normal, sclerae anicteric  ENMT: External Ears, Nose appear normal. Grossly normal hearing. Mucous membranes are moist.  Has poor dentition Neck: Appears normal, supple, no cervical masses, normal ROM, no appreciable thyromegaly, mild JVD Respiratory: Diminished to auscultation bilaterally, no wheezing, rales, rhonchi but has some mild crackles. Normal respiratory effort and patient is not tachypenic. No accessory muscle use.  Cardiovascular: Irregularly Irregular, no murmurs / rubs / gallops. S1 and S2 auscultated. 1+ LE extremity edema.  Abdomen: Soft, Mildly tender, Distended due to body habitus. No masses palpated. No appreciable hepatosplenomegaly. Bowel sounds positive x4.  GU: Deferred. Musculoskeletal:  Good ROM, no contractures. Normal strength and muscle tone.  Skin: No rashes, lesions, ulcers on a limited skin evaluation. No induration; Warm and dry.  Neurologic: CN 2-12 grossly intact with no focal deficits.  Romberg sign and cerebellar reflexes not assessed.  Psychiatric: Normal judgment and insight. Alert and oriented x 3. Anxious mood and appropriate affect.   Labs on Admission: I have personally reviewed following labs and imaging studies  CBC: Recent Labs  Lab  03/27/18 1124  WBC 6.4  NEUTROABS 5.4  HGB 5.3*  HCT 21.3*  MCV 66.6*  PLT 244   Basic Metabolic Panel: Recent Labs  Lab 03/27/18 1124 03/27/18 1808  NA 130*  --   K 3.5  --   CL 96*  --   CO2 25  --   GLUCOSE 168*  --   BUN 37*  --   CREATININE 1.68*  --   CALCIUM 8.3*  --   MG  --  2.0   GFR: Estimated Creatinine Clearance: 52.1 mL/min (A) (by C-G formula based on SCr  of 1.68 mg/dL (H)). Liver Function Tests: No results for input(s): AST, ALT, ALKPHOS, BILITOT, PROT, ALBUMIN in the last 168 hours. No results for input(s): LIPASE, AMYLASE in the last 168 hours. No results for input(s): AMMONIA in the last 168 hours. Coagulation Profile: No results for input(s): INR, PROTIME in the last 168 hours. Cardiac Enzymes: Recent Labs  Lab 03/27/18 1808  TROPONINI 0.04*   BNP (last 3 results) No results for input(s): PROBNP in the last 8760 hours. HbA1C: No results for input(s): HGBA1C in the last 72 hours. CBG: Recent Labs  Lab 03/27/18 1726 03/27/18 2048  GLUCAP 63* 270*   Lipid Profile: No results for input(s): CHOL, HDL, LDLCALC, TRIG, CHOLHDL, LDLDIRECT in the last 72 hours. Thyroid Function Tests: Recent Labs    03/27/18 1900  TSH 4.191   Anemia Panel: Recent Labs    03/27/18 1253  VITAMINB12 861  FOLATE 15.9  FERRITIN 8*  TIBC 395  IRON 19*  RETICCTPCT 2.3   Urine analysis:    Component Value Date/Time   COLORURINE YELLOW 03/27/2018 1520   APPEARANCEUR CLEAR 03/27/2018 1520   LABSPEC 1.006 03/27/2018 1520   PHURINE 5.0 03/27/2018 1520   GLUCOSEU NEGATIVE 03/27/2018 1520   HGBUR NEGATIVE 03/27/2018 1520   BILIRUBINUR NEGATIVE 03/27/2018 1520   KETONESUR NEGATIVE 03/27/2018 1520   PROTEINUR NEGATIVE 03/27/2018 1520   UROBILINOGEN 1.0 12/01/2014 1300   NITRITE NEGATIVE 03/27/2018 1520   LEUKOCYTESUR NEGATIVE 03/27/2018 1520   Sepsis Labs: !!!!!!!!!!!!!!!!!!!!!!!!!!!!!!!!!!!!!!!!!!!! @LABRCNTIP (procalcitonin:4,lacticidven:4) )No results  found for this or any previous visit (from the past 240 hour(s)).   Radiological Exams on Admission: Dg Chest 2 View  Result Date: 03/27/2018 CLINICAL DATA:  Chest pain EXAM: CHEST - 2 VIEW COMPARISON:  09/12/2017 FINDINGS: Cardiac shadow remains enlarged. Defibrillator is again noted and stable as are postoperative changes. Increasing right-sided pleural effusion with likely underlying atelectasis is present. Mild central vascular congestion is noted as well. IMPRESSION: Mild central vascular congestion. Increasing right basilar effusion and likely underlying atelectasis. Electronically Signed   By: Alcide Clever M.D.   On: 03/27/2018 12:02   EKG: Independently reviewed. Showed A Fib at a rate of 96 and low voltage.   Assessment/Plan Active Problems:   Essential hypertension   CAD- s/p CABG July 2014 [facility]   Microcytic anemia   AF (atrial fibrillation) (HCC)   ICD - in place- BS May 2014 [place]   Tobacco abuse   PVD (peripheral vascular disease) (HCC)   DM (diabetes mellitus), type 2 with peripheral vascular complications (HCC)   Atrial fibrillation with controlled ventricular response (HCC)   Atherosclerosis of native arteries of extremity with intermittent claudication (HCC)   SOB (shortness of breath)   Chronic anticoagulation   Cardiomyopathy, ischemic   CHF (congestive heart failure) (HCC)   Congestive heart disease (HCC)   Chronic systolic heart failure (HCC)  Symptomatic anemia -Admit to stepdown unit cardiac for -Patient's globin/hematocrit admission was 5.3/21.4 -Type and screen and transfuse 2 units PRBCs -Prior to transfusion given IV Lasix 20 mg and in between units give another 20 mg -Continue diuresis with daily Lasix Continue monitor for signs and symptoms bleeding -Patient's FOBT was negative -Continue with Xopenex for breathing treatments as well as ipratropium scheduled -Anemia panel done and showed an iron level of 19, U IBC of 376, TIBC of 395,  saturation of 5%, ferritin level of 8, folate level 15.9, and vitamin B12 level of 861 -Has a history of a duodenal ulcer strict hemorrhage -Continue  monitor hemoglobin very carefully -Repeat CBC in the a.m. -May Need to discuss with Gastroenterology in the a.m.  Suspected acute systolic CHF with last EF of 20 to 25% -In the setting of his symptomatic anemia -Has AICD in place -CXR showed "Mild Central Vascular congestion and Increasing right basilar effusion and likely underlying atelectasis." -Start diuresis with IV Lasix.  Patient states that his torsemide is not longer working -Takes torsemide and metolazone in the outpatient setting -We will defer to cardiology to restart these but for now we will continue IV Lasix -Repeat echocardiogram -Continue spironolactone for now  -We will resume Imdur but currently hold hydralazine given his lower blood pressures but will need hydralazine added back when safe -Consult cardiology for further evaluation and recommendations; EDP is placing consult -I's and O's and daily weights -BNP was elevated 499 on admission -Continue monitor volume status carefully -Placed in stepdown unit  Atrial fibrillation -Currently not in RVR -Has refused cardioversion in the past -Continue with cardiac monitoring -Holding Xarelto given his acute anemia -Continue with home digoxin get digoxin level -We will need further cardiac evaluation  History of GI bleeding with duodenal ulceration and clipping in 2005 -Denies any melena, hematochezia, coffee-ground emesis or hematemesis -Holding Xarelto given globin of 5.5 -May need to discuss with gastroenterology in the a.m.  PAD -Had a last ABI done in 2015 -Follows with Dr. Allyson Sabal -Statin -Continues to smoke and does not plan to quit  Chest pain rule out ACS -Placed on cardiac telemetry and cycle cardiac troponins -Check echocardiogram -Have cardiology evaluate for further evaluation recommendations -Check  TSH -UDS -Likely with chest pain may be in the setting of his anemia -Cycle cardiac troponins  Insulin-dependent diabetes mellitus -Hold his home glipizide -As hemoglobin A1c at his PCP office 6.5 -Continue with home regimen but reduce dosing to 30 units and place on NovoLog/scale insulin -Order CBGs carefully and repeat hemoglobin A1c in a.m.  Acute kidney injury in the setting of CKD stage III -In the setting of anemia and mild volume overload -Continue diuresis as above -Transfuse 2 units of PRBCs -Spironolactone -Avoid other nephrotoxic medications possible -To monitor and trend renal function -Avoid IV fluid given suspected CHF  Hyperlipidemia -Continue with atorvastatin 40 mg p.o. daily  CAD with history of occlusion of a small RCA with collaterals and history of CABG with MAZE -Continue with isosorbide mononitrate and atorvastatin  Glaucoma -Continue with latanoprost  Hyponatremia -Likely in the setting of hypervolemic hyponatremia -Continue monitor and trend sodium with diuresis -Repeat CMP in a.m.  Gout -Currently holding up allopurinol will resume when kidney function function is improved  Hypertension -Patient blood pressure was on the lower side -We will currently hold hydralazine 25 mg p.o. nightly but will continue isosorbide mononitrate -Holding home diuretics with metolazone and torsemide  Tobacco Abuse -Smoking cessation counseling given  Obesity -Estimated body mass index is 39.37 kg/m as calculated from the following:   Height as of this encounter: 5\' 5"  (1.651 m).   Weight as of this encounter: 107.3 kg. -Weight Loss Counseling given   DVT prophylaxis: SCDs Code Status: FULL CODE Family Communication: No Family present at bedside Disposition Plan: Remain Inpatient for Workup and Diuresis Consults called: Cardiology by EDP Admission status: Inpatient SDU  Severity of Illness: The appropriate patient status for this patient is INPATIENT.  Inpatient status is judged to be reasonable and necessary in order to provide the required intensity of service to ensure the patient's safety. The patient's presenting symptoms,  physical exam findings, and initial radiographic and laboratory data in the context of their chronic comorbidities is felt to place them at high risk for further clinical deterioration. Furthermore, it is not anticipated that the patient will be medically stable for discharge from the hospital within 2 midnights of admission. The following factors support the patient status of inpatient.   " The patient's presenting symptoms include Chest Pain, SOB, Heart Fluttering. " The worrisome physical exam findings include Leg Edema. " The initial radiographic and laboratory data are worrisome because of Sympotmatic Anemia and concern for volume overload. " The chronic co-morbidities are as above  * I certify that at the point of admission it is my clinical judgment that the patient will require inpatient hospital care spanning beyond 2 midnights from the point of admission due to high intensity of service, high risk for further deterioration and high frequency of surveillance required.Merlene Laughter, D.O. [facility] PAGER is on AMION  If 7PM-7AM, please contact night-coverage [email or web address] Password [email or web address]  03/27/2018, 8:59 PM

## 2018-03-27 NOTE — Progress Notes (Signed)
CRITICAL VALUE ALERT  Critical Value:  Trop 0.01   Date & Time Notied:  03/27/2018 2025  Provider Notified: Schorr  Orders Received/Actions taken:

## 2018-03-27 NOTE — ED Triage Notes (Signed)
Pt arrives to ED from home with complaints of cp since last night while the pt was asleep. EMS reports pt took 324 home aspirin pta with no change in pain. Pt has hx of chf and afib, which he takes xarelto for, reports not missing any doses. Pt placed in position of comfort with bed locked and lowered, call bell in reach.

## 2018-03-27 NOTE — ED Provider Notes (Signed)
[facility] Note   CSN: [id number] Arrival date & time: 03/27/18  1110     History   Chief Complaint Chief Complaint  Patient presents with  . Chest Pain    HPI Jacob [name] is a 63 y.o. male with a past medical history of CAD, atrial fibrillation currently anticoagulated on Xarelto, CHF EF of 20 to 25% on echo done 08/2017, diabetes, hypertension, prior MI who presents to ED for shortness of breath, intermittent chest pain for the past 12 hours.  States that symptoms woke him up from his sleep.  Chest pain has been intermittent, with no specific aggravating or alleviating factor.  No improvement with home aspirin.  History of similar symptoms in the past, including 2 weeks ago patient unable to tell me what the cause was after he was evaluated by a provider.  He last saw his cardiologist several days ago and did not need to change any of his medications.  He reports compliance with his home medications.  Reports intermittent dry cough and nausea, as well as leg swelling.  Denies any injuries or falls, fever, hemoptysis, abdominal pain or sick contacts.  HPI  Past Medical History:  Diagnosis Date  . Atrial fibrillation (HCC)    RVR 10/2014  . Automatic implantable cardioverter-defibrillator in situ   . CAD (coronary artery disease) Sept 2013   s/p cardiac cath showing occlusion of small RCA with collaterals  . CHF (congestive heart failure) (HCC)    20 to 25 % EF and RV dysfunction by 07/2014 echo   . Chronic anticoagulation    on xarelto.   . High cholesterol   . Hypertension   . Myocardial infarction (HCC) 2014  . Noncompliance    homelessness contributing.   . Peripheral arterial disease (HCC)   . Type II diabetes mellitus [facility](HCC)     Patient Active Problem List   Diagnosis Date Noted  . Hypokalemia   . Vitamin D deficiency 07/07/2017  . Exertional angina (HCC)   . Chronic systolic heart failure (HCC) 12/17/2016  .  Hyperglycemia 12/02/2016  . Osteoarthritis of right knee 12/02/2016  . Congestive heart disease (HCC) 01/01/2016  . Diabetic neuropathy (HCC) 11/28/2015  . Ascites 11/09/2015  . CHF (congestive heart failure) (HCC) 11/09/2015  . Insomnia 04/18/2015  . Claudication of right lower extremity (HCC) 04/07/2015  . Cardiomyopathy, ischemic 04/07/2015  . Chronic anticoagulation 03/30/2015  . Cardiorenal syndrome with renal failure   . SOB (shortness of breath)   . Morbid obesity due to excess calories (HCC)   . Helicobacter pylori ab+ 12/12/2014  . Calf pain   . Duodenal ulcer with hemorrhage   . Hemorrhagic shock (HCC)   . Diabetes mellitus (HCC)   . Coronary artery disease involving native coronary artery of native heart without angina pectoris   . Atrial fibrillation with rapid ventricular response (HCC) 10/29/2014  . Chest pain   . Atherosclerosis of native arteries of extremity with intermittent claudication (HCC) 09/06/2014  . Atrial fibrillation with controlled ventricular response (HCC) 08/03/2014  . Bacteremia   . DM (diabetes mellitus), type 2 with peripheral vascular complications (HCC) 08/01/2014  . Anemia of chronic disease 08/01/2014  . PVD (peripheral vascular disease) (HCC) 10/29/2013  . Tobacco abuse 05/13/2013  . AF (atrial fibrillation) (HCC) 02/11/2013  . ICD - in place- BS [facility] 02/11/2013  . Microcytic anemia 01/11/2013  . CAD- s/p CABG [facility] 12/04/2011  . Noncompliance 12/01/2011  .  Essential hypertension 10/30/2006    Past Surgical History:  Procedure Laterality Date  . CARDIAC CATHETERIZATION  09/2012  . CORONARY ANGIOPLASTY WITH STENT PLACEMENT  11/2011   "1"  . CORONARY ARTERY BYPASS GRAFT  09/2012   2 vessels per patient [name]   . ESOPHAGOGASTRODUODENOSCOPY N/A 11/28/2014   Procedure: ESOPHAGOGASTRODUODENOSCOPY (EGD);  Surgeon: Beverley Fiedler, MD;  Location: [facility] ENDOSCOPY;  Service: Endoscopy;  Laterality: N/A;  . ILIAC  ARTERY STENT Right 08/30/2013  . IMPLANTABLE CARDIOVERTER DEFIBRILLATOR IMPLANT     [place] in 07/2012; [employer]  . LEFT AND RIGHT HEART CATHETERIZATION WITH CORONARY ANGIOGRAM N/A 12/02/2011   Procedure: LEFT AND RIGHT HEART CATHETERIZATION WITH CORONARY ANGIOGRAM;  Surgeon: Kathleene Hazel, MD;  Location: [facility] CATH LAB;  Service: Cardiovascular;  Laterality: N/A;  . LOWER EXTREMITY ANGIOGRAM N/A 08/30/2013   Procedure: LOWER EXTREMITY ANGIOGRAM;  Surgeon: Runell Gess, MD;  Location: [facility] CATH LAB;  Service: Cardiovascular;  Laterality: N/A;  . LOWER EXTREMITY ANGIOGRAM N/A 12/02/2013   Procedure: LOWER EXTREMITY ANGIOGRAM;  Surgeon: Runell Gess, MD;  Location: [facility] CATH LAB;  Service: Cardiovascular;  Laterality: N/A;  . RIGHT/LEFT HEART CATH AND CORONARY/GRAFT ANGIOGRAPHY N/A 04/23/2017   Procedure: RIGHT/LEFT HEART CATH AND CORONARY/GRAFT ANGIOGRAPHY;  Surgeon: Dolores Patty, MD;  Location: [facility] INVASIVE CV LAB;  Service: Cardiovascular;  Laterality: N/A;        Home Medications    Prior to Admission medications   Medication Sig Start Date End Date Taking? Authorizing Provider  cephALEXin (KEFLEX) 500 MG capsule Take 1 capsule (500 mg total) by mouth 2 (two) times daily. 03/19/18  Yes Hoy Register, MD  meclizine (ANTIVERT) 25 MG tablet TAKE 1 TABLET (25 MG TOTAL) BY MOUTH 3 (THREE) TIMES DAILY AS NEEDED FOR DIZZINESS. 02/06/18  Yes Hoy Register, MD  metolazone (ZAROXOLYN) 2.5 MG tablet Take 1 tablet once as directed by CHF clinic. 03/19/18  Yes Hoy Register, MD  traZODone (DESYREL) 100 MG tablet Take 1 tablet (100 mg total) by mouth at bedtime as needed for sleep. 03/19/18  Yes Newlin, Enobong, MD  VENTOLIN HFA 108 (90 Base) MCG/ACT inhaler INHALE 2 PUFFS INTO THE LUNGS EVERY 6 (SIX) HOURS AS NEEDED FOR WHEEZING OR SHORTNESS OF BREATH. 03/04/18  Yes Newlin, Enobong, MD  XARELTO 20 MG TABS tablet TAKE ONE TABLET BY MOUTH ONCE DAILY Patient taking differently: Take  20 mg by mouth daily with supper.  03/04/18  Yes Hoy Register, MD  ACCU-CHEK SOFTCLIX LANCETS lancets Use for once daily testing of blood sugar 06/02/17   Newlin, Odette Horns, MD  acetaminophen-codeine (TYLENOL #3) 300-30 MG tablet TAKE 1 TABLET BY MOUTH AT BEDTIME AS NEEDED FOR MODERATE PAIN. 09/09/17   Hoy Register, MD  albuterol (PROVENTIL) (2.5 MG/3ML) 0.083% nebulizer solution Take 3 mLs (2.5 mg total) by nebulization every 6 (six) hours as needed for wheezing or shortness of breath. 04/18/15   Hoy Register, MD  allopurinol (ZYLOPRIM) 100 MG tablet TAKE 2 TABLETS (200 MG TOTAL) BY MOUTH EVERY MORNING 03/19/18   Hoy Register, MD  atorvastatin (LIPITOR) 40 MG tablet TAKE 1 TABLET BY MOUTH EVERY EVENING AT 6 PM 03/19/18   Hoy Register, MD  Blood Glucose Monitoring Suppl (ACCU-CHEK AVIVA) device Use as instructed1 times daily before meals 06/02/17   Hoy Register, MD  cetirizine (ZYRTEC) 10 MG tablet TAKE 1 TABLET (10 MG TOTAL) BY MOUTH DAILY. 02/06/18   Hoy Register, MD  diclofenac sodium (VOLTAREN) 1 % GEL APPLY 4 GRAMS TOPICALLY 4 (FOUR)  TIMES DAILY 11/17/17   Hoy Register, MD  digoxin (LANOXIN) 0.125 MG tablet Take 0.5 tablets (62.5 mcg total) by mouth daily. 03/19/18   Hoy Register, MD  ergocalciferol (DRISDOL) 50000 units capsule Take 1 capsule (50,000 Units total) by mouth once a week. Patient not taking: Reported on 03/19/2018 06/03/17   Hoy Register, MD  gabapentin (NEURONTIN) 300 MG capsule Take 1 capsule (300 mg total) by mouth daily. 03/19/18   Hoy Register, MD  glipiZIDE (GLUCOTROL) 10 MG tablet Take 1 tablet (10 mg total) by mouth 2 (two) times daily before a meal. 03/19/18   Hoy Register, MD  glucose blood (ACCU-CHEK AVIVA) test strip Use as instructed for 1 times daily testing of blood sugar 06/02/17   Hoy Register, MD  hydrALAZINE (APRESOLINE) 25 MG tablet TAKE 1 TABLET BY MOUTH THREE TIMES DAILY ( EVERY MORNING, NOON,EVENING ) 03/19/18   Hoy Register,  MD  Insulin Glargine (LANTUS) 100 UNIT/ML Solostar Pen Inject 48 Units into the skin daily at 10 pm. 03/19/18   Hoy Register, MD  isosorbide mononitrate (IMDUR) 30 MG 24 hr tablet Take 1 tablet (30 mg total) by mouth daily. 03/19/18   Hoy Register, MD  Lancet Devices [facility]) lancets Use as instructed for once daily testing of blood sugar 11/28/15   Hoy Register, MD  Misc. Devices MISC Shower chair.  Diagnosis: CHF 02/18/18   Hoy Register, MD  potassium chloride SA (K-DUR,KLOR-CON) 20 MEQ tablet Take 2 tablets (40 mEq total) by mouth daily as needed. TAKE ONLY ON DAYS YOU TAKE METOLAZONE!! 09/30/17   Alford Highland, NP  spironolactone (ALDACTONE) 25 MG tablet Take 0.5 tablets (12.5 mg total) by mouth daily. 03/19/18   Hoy Register, MD  torsemide (DEMADEX) 20 MG tablet Take 4 tablets (80 mg total) by mouth 2 (two) times daily. 03/19/18   Hoy Register, MD    Family History Family History  Problem Relation Age of Onset  . Diabetes Mother     Social History Social History   Tobacco Use  . Smoking status: Current Every Day Smoker    Packs/day: 0.50    Years: 40.00    Pack years: 20.00    Types: Cigarettes  . Smokeless tobacco: Never Used  Substance Use Topics  . Alcohol use: No    Alcohol/week: 0.0 standard drinks  . Drug use: No     Allergies   Pork-derived products   Review of Systems Review of Systems  Constitutional: Negative for appetite change, chills and fever.  HENT: Negative for ear pain, rhinorrhea, sneezing and sore throat.   Eyes: Negative for photophobia and visual disturbance.  Respiratory: Negative for cough, chest tightness, shortness of breath and wheezing.   Cardiovascular: Negative for chest pain and palpitations.  Gastrointestinal: Negative for abdominal pain, blood in stool, constipation, diarrhea, nausea and vomiting.  Genitourinary: Negative for dysuria, hematuria and urgency.  Musculoskeletal: Negative for myalgias.  Skin:  Negative for rash.  Neurological: Negative for dizziness, weakness and light-headedness.     Physical Exam Updated Vital Signs BP 107/66 (BP Location: Right Arm)   Pulse 97   Temp 98.4 F (36.9 C) (Oral)   Resp 18   Ht 5\' 5"  (1.651 m)   Wt 104.8 kg   SpO2 97%   BMI 38.44 kg/m   Physical Exam Vitals signs and nursing note reviewed. Exam conducted with a chaperone present.  Constitutional:      General: He is not in acute distress.    Appearance: He is  well-developed.  HENT:     Head: Normocephalic and atraumatic.     Nose: Nose normal.  Eyes:     General: No scleral icterus.       Left eye: No discharge.     Conjunctiva/sclera: Conjunctivae normal.  Neck:     Musculoskeletal: Normal range of motion and neck supple.  Cardiovascular:     Rate and Rhythm: Normal rate and regular rhythm.     Heart sounds: Normal heart sounds. No murmur. No friction rub. No gallop.   Pulmonary:     Effort: Pulmonary effort is normal. No respiratory distress.     Breath sounds: Normal breath sounds.  Abdominal:     General: Bowel sounds are normal. There is no distension.     Palpations: Abdomen is soft.     Tenderness: There is no abdominal tenderness. There is no guarding.  Genitourinary:    Comments: Rectal exam grossly Hemoccult negative. Musculoskeletal: Normal range of motion.     Comments: 1+ pitting edema in bilateral lower extremities.  Skin:    General: Skin is warm and dry.     Findings: No rash.  Neurological:     Mental Status: He is alert.     Motor: No abnormal muscle tone.     Coordination: Coordination normal.      ED Treatments / Results  Labs (all labs ordered are listed, but only abnormal results are displayed) Labs Reviewed  BASIC METABOLIC PANEL - Abnormal; Notable for the following components:      Result Value   Sodium 130 (*)    Chloride 96 (*)    Glucose, Bld 168 (*)    BUN 37 (*)    Creatinine, Ser 1.68 (*)    Calcium 8.3 (*)    GFR calc non Af  Amer 43 (*)    GFR calc Af Amer 50 (*)    All other components within normal limits  CBC WITH DIFFERENTIAL/PLATELET - Abnormal; Notable for the following components:   RBC 3.20 (*)    Hemoglobin 5.3 (*)    HCT 21.3 (*)    MCV 66.6 (*)    MCH 16.6 (*)    MCHC 24.9 (*)    RDW 24.0 (*)    nRBC 0.6 (*)    Lymphs Abs 0.6 (*)    Basophils Absolute 0.2 (*)    nRBC 1 (*)    All other components within normal limits  BRAIN NATRIURETIC PEPTIDE - Abnormal; Notable for the following components:   B Natriuretic Peptide 499.7 (*)    All other components within normal limits  RETICULOCYTES - Abnormal; Notable for the following components:   RBC. 3.02 (*)    Immature Retic Fract 19.9 (*)    All other components within normal limits  VITAMIN B12  FOLATE  IRON AND TIBC  FERRITIN  URINALYSIS, ROUTINE W REFLEX MICROSCOPIC  RAPID URINE DRUG SCREEN, HOSP PERFORMED  I-STAT TROPONIN, ED  POC OCCULT BLOOD, ED  PREPARE RBC (CROSSMATCH)  TYPE AND SCREEN    EKG EKG Interpretation  Date/Time:  Friday March 27 2018 11:09:12 EST Ventricular Rate:  96 PR Interval:    QRS Duration: 109 QT Interval:  382 QTC Calculation: 483 R Axis:   -55 Text Interpretation:  Atrial fibrillation LAD, consider left anterior fascicular block Low voltage, extremity leads Nonspecific T abnormalities, lateral leads Borderline prolonged QT interval No significant change since last tracing Confirmed by Alvira MondaySchlossman, Erin ([id number]) on 03/27/2018 12:14:11 PM   Radiology Dg Chest  2 View  Result Date: 03/27/2018 CLINICAL DATA:  Chest pain EXAM: CHEST - 2 VIEW COMPARISON:  09/12/2017 FINDINGS: Cardiac shadow remains enlarged. Defibrillator is again noted and stable as are postoperative changes. Increasing right-sided pleural effusion with likely underlying atelectasis is present. Mild central vascular congestion is noted as well. IMPRESSION: Mild central vascular congestion. Increasing right basilar effusion and likely underlying  atelectasis. Electronically Signed   By: Alcide Clever M.D.   On: 03/27/2018 12:02    Procedures Procedures (including critical care time)  CRITICAL CARE Performed by: Dietrich Pates   Total critical care time: 45 minutes  Critical care time was exclusive of separately billable procedures and treating other patients.  Critical care was necessary to treat or prevent imminent or life-threatening deterioration.  Critical care was time spent personally by me on the following activities: development of treatment plan with patient and/or surrogate as well as nursing, discussions with consultants, evaluation of patient's response to treatment, examination of patient, obtaining history from patient or surrogate, ordering and performing treatments and interventions, ordering and review of laboratory studies, ordering and review of radiographic studies, pulse oximetry and re-evaluation of patient's condition.   Medications Ordered in ED Medications  0.9 %  sodium chloride infusion (Manually program via Guardrails IV Fluids) (has no administration in time range)     Initial Impression / Assessment and Plan / ED Course  I have reviewed the triage vital signs and the nursing notes.  Pertinent labs & imaging results that were available during my care of the patient were reviewed by me and considered in my medical decision making (see chart for details).  Clinical Course as of Mar 27 1406  Fri Mar 27, 2018  1324 Hemoglobin(!!): 5.3 [HK]    Clinical Course User Index [HK] Dietrich Pates, [place]    63 year old male with a past medical history of CAD, atrial fibrillation currently on Xarelto, CHF with EF 20-25, diabetes, hypertension presents to ED for shortness of breath, intermittent chest pain for the past 12 hours.  Chest pain has been intermittent.  No improvement with home aspirin.  He denies any abdominal pain but does endorse nausea.  No vomiting.  Denies any bleeding or dark stools.  On exam  there is pitting edema in bilateral lower extremities.  Abdomen is soft, nontender nondistended.  Vital signs are within normal limits.  EKG shows no changes from prior tracings.  Troponin is negative.  Chest x-ray shows mild vascular congestion.  CBC significant for hemoglobin of 5.3.  Fecal occult is negative.  He again denies any active bleeding.  Anemia panel pending, will transfuse 2 units of blood and admit to hospitalist. I spoke to cardiology who states that hospitalist can consult cardiology after hospitalist has evaluated the patient, they have no recommendations at this time.   Portions of this note were generated with Scientist, clinical (histocompatibility and immunogenetics). Dictation errors may occur despite best attempts at proofreading.   Final Clinical Impressions(s) / ED Diagnoses   Final diagnoses:  Anemia requiring transfusions  Acute on chronic congestive heart failure, unspecified heart failure type [facility])    ED Discharge Orders    None       Dietrich Pates, PA-C 03/27/18 1409    Alvira Monday, MD 03/28/18 1102

## 2018-03-27 NOTE — Telephone Encounter (Signed)
Dee (paramedicine) called to report that pt has edema in abdomen, HR going from 118-80's, and in Afib. Geraldine Contras stated that he call 911 to bring him to [facility] for observation due to chest pain. Geraldine Contras also stated that it doesn't look like pt is taking his meds like he should.

## 2018-03-28 ENCOUNTER — Encounter (HOSPITAL_COMMUNITY): Payer: Self-pay | Admitting: Gastroenterology

## 2018-03-28 ENCOUNTER — Inpatient Hospital Stay (HOSPITAL_COMMUNITY): Payer: Medicare Other

## 2018-03-28 DIAGNOSIS — I4891 Unspecified atrial fibrillation: Secondary | ICD-10-CM

## 2018-03-28 DIAGNOSIS — I5022 Chronic systolic (congestive) heart failure: Secondary | ICD-10-CM

## 2018-03-28 DIAGNOSIS — I255 Ischemic cardiomyopathy: Secondary | ICD-10-CM

## 2018-03-28 DIAGNOSIS — R0602 Shortness of breath: Secondary | ICD-10-CM

## 2018-03-28 DIAGNOSIS — D509 Iron deficiency anemia, unspecified: Secondary | ICD-10-CM

## 2018-03-28 LAB — CBC WITH DIFFERENTIAL/PLATELET
Abs Immature Granulocytes: 0.05 10*3/uL (ref 0.00–0.07)
BASOS PCT: 1 %
Basophils Absolute: 0.1 10*3/uL (ref 0.0–0.1)
Eosinophils Absolute: 0.1 10*3/uL (ref 0.0–0.5)
Eosinophils Relative: 1 %
HCT: 24.3 % — ABNORMAL LOW (ref 39.0–52.0)
Hemoglobin: 6.9 g/dL — CL (ref 13.0–17.0)
Immature Granulocytes: 1 %
Lymphocytes Relative: 13 %
Lymphs Abs: 1 10*3/uL (ref 0.7–4.0)
MCH: 19.3 pg — ABNORMAL LOW (ref 26.0–34.0)
MCHC: 28.4 g/dL — ABNORMAL LOW (ref 30.0–36.0)
MCV: 68.1 fL — ABNORMAL LOW (ref 80.0–100.0)
Monocytes Absolute: 0.7 10*3/uL (ref 0.1–1.0)
Monocytes Relative: 10 %
Neutro Abs: 5.9 10*3/uL (ref 1.7–7.7)
Neutrophils Relative %: 74 %
Platelets: 254 10*3/uL (ref 150–400)
RBC: 3.57 MIL/uL — ABNORMAL LOW (ref 4.22–5.81)
RDW: 25.7 % — ABNORMAL HIGH (ref 11.5–15.5)
WBC: 7.8 10*3/uL (ref 4.0–10.5)
nRBC: 0.9 % — ABNORMAL HIGH (ref 0.0–0.2)

## 2018-03-28 LAB — GLUCOSE, CAPILLARY
Glucose-Capillary: 156 mg/dL — ABNORMAL HIGH (ref 70–99)
Glucose-Capillary: 217 mg/dL — ABNORMAL HIGH (ref 70–99)
Glucose-Capillary: 159 mg/dL — ABNORMAL HIGH (ref 70–99)
Glucose-Capillary: 75 mg/dL (ref 70–99)

## 2018-03-28 LAB — TROPONIN I
Troponin I: 0.04 ng/mL (ref <0.03)
Troponin I: 0.04 ng/mL (ref <0.03)

## 2018-03-28 LAB — PREPARE RBC (CROSSMATCH)

## 2018-03-28 LAB — ECHOCARDIOGRAM COMPLETE
Height: 65 in
Weight: 3785.6 oz

## 2018-03-28 LAB — COMPREHENSIVE METABOLIC PANEL
ALT: 15 U/L (ref 0–44)
AST: 24 U/L (ref 15–41)
Albumin: 2.8 g/dL — ABNORMAL LOW (ref 3.5–5.0)
Alkaline Phosphatase: 195 U/L — ABNORMAL HIGH (ref 38–126)
Anion gap: 9 (ref 5–15)
BUN: 41 mg/dL — ABNORMAL HIGH (ref 8–23)
CO2: 25 mmol/L (ref 22–32)
Calcium: 8.1 mg/dL — ABNORMAL LOW (ref 8.9–10.3)
Chloride: 94 mmol/L — ABNORMAL LOW (ref 98–111)
Creatinine, Ser: 1.73 mg/dL — ABNORMAL HIGH (ref 0.61–1.24)
GFR calc non Af Amer: 42 mL/min — ABNORMAL LOW (ref >60)
GFR, EST AFRICAN AMERICAN: 48 mL/min — AB (ref >60)
Glucose, Bld: 190 mg/dL — ABNORMAL HIGH (ref 70–99)
Potassium: 4 mmol/L (ref 3.5–5.1)
Sodium: 128 mmol/L — ABNORMAL LOW (ref 135–145)
TOTAL PROTEIN: 7.3 g/dL (ref 6.5–8.1)
Total Bilirubin: 1 mg/dL (ref 0.3–1.2)

## 2018-03-28 LAB — DIGOXIN LEVEL: Digoxin Level: 0.4 ng/mL — ABNORMAL LOW (ref 0.8–2.0)

## 2018-03-28 LAB — MAGNESIUM: Magnesium: 2 mg/dL (ref 1.7–2.4)

## 2018-03-28 LAB — PHOSPHORUS: Phosphorus: 3.1 mg/dL (ref 2.5–4.6)

## 2018-03-28 MED ORDER — SODIUM CHLORIDE 0.9% IV SOLUTION
Freq: Once | INTRAVENOUS | Status: AC
  Administered 2018-03-28: 10:00:00 via INTRAVENOUS

## 2018-03-28 MED ORDER — LEVALBUTEROL HCL 0.63 MG/3ML IN NEBU
0.6300 mg | INHALATION_SOLUTION | Freq: Two times a day (BID) | RESPIRATORY_TRACT | Status: DC
  Administered 2018-03-28 – 2018-03-30 (×4): 0.63 mg via RESPIRATORY_TRACT
  Filled 2018-03-28 (×4): qty 3

## 2018-03-28 MED ORDER — FUROSEMIDE 10 MG/ML IJ SOLN
40.0000 mg | Freq: Two times a day (BID) | INTRAMUSCULAR | Status: DC
  Administered 2018-03-28 – 2018-03-30 (×4): 40 mg via INTRAVENOUS
  Filled 2018-03-28 (×4): qty 4

## 2018-03-28 MED ORDER — IPRATROPIUM BROMIDE 0.02 % IN SOLN
0.5000 mg | Freq: Two times a day (BID) | RESPIRATORY_TRACT | Status: DC
  Administered 2018-03-28 – 2018-03-30 (×4): 0.5 mg via RESPIRATORY_TRACT
  Filled 2018-03-28 (×4): qty 2.5

## 2018-03-28 MED ORDER — SODIUM CHLORIDE 0.9 % IV SOLN
INTRAVENOUS | Status: DC
  Administered 2018-03-28: 23:00:00 via INTRAVENOUS

## 2018-03-28 NOTE — Consult Note (Addendum)
Cardiology Consultation:   Patient ID: Benancio [place] MRN: [id number]; DOB: [birth year]  Admit date: 03/27/2018 Date of Consult: 03/28/2018  Primary Care Provider: Hoy Register, MD Primary Cardiologist: Arvilla Meres, MD  Primary Electrophysiologist:  Lewayne Bunting, MD    Patient Profile:   Graysyn Freedland is a 63 y.o. male with a hx of systolic heart failure with EF 20-25% status post ICD 2014, RV dysfunction, CAD s/p CABG x2 w/ maze 2014, diabetes type 2 on insulin, chronic A. fib on Xarelto, GI bleed 11/2014, hyperlipidemia and noncompliance who is being seen today for the evaluation of atrial fibrillation and CHF at the request of Dr Marland Mcalpine.  History of Present Illness:   Mr. Schweitzer is followed in the advanced heart failure clinic.  He lives alone in low income housing and is estranged from family.  He had a hospitalization in 10/2014 for increased dyspnea on exertion and later found to be in cardiogenic shock requiring milrinone and norepinephrine.  He was diuresed with IV Lasix.  His hospitalization was complicated by GI bleed and A. fib with RVR.  He was loaded with amiodarone but later placed on Toprol-XL for rate control.  In 11/2014 he had an EGD with duodenal bleed and clip applied.    He was also admitted in 03/2017 with chest pain and heart failure.  Cath on 04/23/2017 showed stable CAD and compensated filling pressures after IV diuresis.  He was again admitted in 08/2017 for chest pain with troponins mildly elevated and flat pattern.  He was diuresed with IV Lasix.   He was just seen in the office on 02/26/2018 by Tonye Becket, NP for heart failure follow-up.  He was noted to have shortness of breath with exertion but no chest pain, orthopnea or PND.  He was noted to only be taking his medications a couple of days per week.  He has bubble packs for medications.  Mr. Odham presented to the hospital on 03/27/2018 with complaints of shortness of breath worsened over the prior 2 days, vague chest pain  and heart fluttering.  In the ED his hemoglobin was noted to be 5.3.  FOBT was negative.  Chest x-ray showed some vascular congestion.  He was transfused with 2 units of PRBCs with 20 mg of IV Lasix prior to and between the 2 units.  Upon my assessment the patient tells me that since his last office visit on 12/5 he has been taking all of his medications as ordered.  He says that about 3-4 days ago he noted that his heart was beating fast and then he developed shortness of breath and weakness.  He has chronic lower leg edema.  He says that he thinks that his diuretics are no longer working for him.  He also has a constant localized left-sided sharp chest pain that is fairly mild.  Since being in the hospital and receiving the 2 units of blood he says that he feels much better.  He denies noticing any bleeding.  No blood in his stools or urine.  He continues to smoke half pack per day.  Past Medical History:  Diagnosis Date  . Atrial fibrillation (HCC)    RVR 10/2014  . Automatic implantable cardioverter-defibrillator in situ   . CAD (coronary artery disease) Sept 2013   s/p cardiac cath showing occlusion of small RCA with collaterals  . CHF (congestive heart failure) (HCC)    20 to 25 % EF and RV dysfunction by 07/2014 echo   . Chronic anticoagulation  on xarelto.   . High cholesterol   . Hypertension   . Myocardial infarction (HCC) 2014  . Noncompliance    homelessness contributing.   . Peripheral arterial disease (HCC)   . Type II diabetes mellitus (HCC)     Past Surgical History:  Procedure Laterality Date  . CARDIAC CATHETERIZATION  09/2012  . CORONARY ANGIOPLASTY WITH STENT PLACEMENT  11/2011   "1"  . CORONARY ARTERY BYPASS GRAFT  09/2012   2 vessels per patient [name]   . ESOPHAGOGASTRODUODENOSCOPY N/A 11/28/2014   Procedure: ESOPHAGOGASTRODUODENOSCOPY (EGD);  Surgeon: Beverley Fiedler, MD;  Location[facility] ENDOSCOPY;  Service: Endoscopy;  Laterality: N/A;  . ILIAC ARTERY STENT Right  08/30/2013  . IMPLANTABLE CARDIOVERTER DEFIBRILLATOR IMPLANT     [place] in 07/2012; [employer]  . LEFT AND RIGHT HEART CATHETERIZATION WITH CORONARY ANGIOGRAM N/A 12/02/2011   Procedure: LEFT AND RIGHT HEART CATHETERIZATION WITH CORONARY ANGIOGRAM;  Surgeon: Kathleene Hazel, MD;  Location: [facility] CATH LAB;  Service: Cardiovascular;  Laterality: N/A;  . LOWER EXTREMITY ANGIOGRAM N/A 08/30/2013   Procedure: LOWER EXTREMITY ANGIOGRAM;  Surgeon: Runell Gess, MD;  Location: [facility] CATH LAB;  Service: Cardiovascular;  Laterality: N/A;  . LOWER EXTREMITY ANGIOGRAM N/A 12/02/2013   Procedure: LOWER EXTREMITY ANGIOGRAM;  Surgeon: Runell Gess, MD;  Location: [facility] CATH LAB;  Service: Cardiovascular;  Laterality: N/A;  . RIGHT/LEFT HEART CATH AND CORONARY/GRAFT ANGIOGRAPHY N/A 04/23/2017   Procedure: RIGHT/LEFT HEART CATH AND CORONARY/GRAFT ANGIOGRAPHY;  Surgeon: Dolores Patty, MD;  Location: [facility] INVASIVE CV LAB;  Service: Cardiovascular;  Laterality: N/A;     Home Medications:  Prior to Admission medications   Medication Sig Start Date End Date Taking? Authorizing Provider  allopurinol (ZYLOPRIM) 100 MG tablet TAKE 2 TABLETS (200 MG TOTAL) BY MOUTH EVERY MORNING Patient taking differently: Take 200 mg by mouth daily.  03/19/18  Yes Newlin, Odette Horns, MD  atorvastatin (LIPITOR) 40 MG tablet TAKE 1 TABLET BY MOUTH EVERY EVENING AT 6 PM Patient taking differently: Take 40 mg by mouth daily at 6 PM.  03/19/18  Yes Newlin, Enobong, MD  cephALEXin (KEFLEX) 500 MG capsule Take 1 capsule (500 mg total) by mouth 2 (two) times daily. 03/19/18  Yes Hoy Register, MD  cetirizine (ZYRTEC) 10 MG tablet TAKE 1 TABLET (10 MG TOTAL) BY MOUTH DAILY. 02/06/18  Yes Hoy Register, MD  digoxin (LANOXIN) 0.125 MG tablet Take 0.5 tablets (62.5 mcg total) by mouth daily. 03/19/18  Yes Hoy Register, MD  gabapentin (NEURONTIN) 300 MG capsule Take 1 capsule (300 mg total) by mouth daily. 03/19/18  Yes Hoy Register, MD  glipiZIDE (GLUCOTROL) 10 MG tablet Take 1 tablet (10 mg total) by mouth 2 (two) times daily before a meal. 03/19/18  Yes Newlin, Enobong, MD  hydrALAZINE (APRESOLINE) 25 MG tablet TAKE 1 TABLET BY MOUTH THREE TIMES DAILY ( EVERY MORNING, NOON,EVENING ) Patient taking differently: Take 25 mg by mouth 3 (three) times daily. ( EVERY MORNING, NOON,EVENING ) 03/19/18  Yes Hoy Register, MD  Insulin Glargine (LANTUS) 100 UNIT/ML Solostar Pen Inject 48 Units into the skin daily at 10 pm. Patient taking differently: Inject 50 Units into the skin daily at 10 pm.  03/19/18  Yes Hoy Register, MD  isosorbide mononitrate (IMDUR) 30 MG 24 hr tablet Take 1 tablet (30 mg total) by mouth daily. 03/19/18  Yes Newlin, Enobong, MD  latanoprost (XALATAN) 0.005 % ophthalmic solution Place 1 drop into both eyes daily. 01/26/18  Yes [provider]  meclizine (ANTIVERT) 25 MG tablet TAKE 1 TABLET (25 MG TOTAL) BY MOUTH 3 (THREE) TIMES DAILY AS NEEDED FOR DIZZINESS. 02/06/18  Yes Newlin, Enobong, MD  metolazone (ZAROXOLYN) 2.5 MG tablet Take 1 tablet once as directed by CHF clinic. Patient taking differently: Take 2.5 mg by mouth 2 (two) times a week. Monday and Friday 03/19/18  Yes [name], Enobong, MD  potassium chloride SA (K-DUR,KLOR-CON) 20 MEQ tablet Take 2 tablets (40 mEq total) by mouth daily as needed. TAKE ONLY ON DAYS YOU TAKE METOLAZONE!! Patient taking differently: Take 20 mEq by mouth 2 (two) times a week. TAKE ONLY ON DAYS YOU TAKE METOLAZONE!! Monday and Friday 09/30/17  Yes Alford HighlandSmith, Ashley M, NP  spironolactone (ALDACTONE) 25 MG tablet Take 0.5 tablets (12.5 mg total) by mouth daily. 03/19/18  Yes [name], Enobong, MD  torsemide (DEMADEX) 20 MG tablet Take 4 tablets (80 mg total) by mouth 2 (two) times daily. 03/19/18  Yes [name], Enobong, MD  traZODone (DESYREL) 100 MG tablet Take 1 tablet (100 mg total) by mouth at bedtime as needed for sleep. 03/19/18  Yes Newlin, Enobong, MD  VENTOLIN  HFA 108 (90 Base) MCG/ACT inhaler INHALE 2 PUFFS INTO THE LUNGS EVERY 6 (SIX) HOURS AS NEEDED FOR WHEEZING OR SHORTNESS OF BREATH. Patient taking differently: Inhale 2 puffs into the lungs every 6 (six) hours as needed for wheezing or shortness of breath.  03/04/18  Yes Newlin, Enobong, MD  XARELTO 20 MG TABS tablet TAKE ONE TABLET BY MOUTH ONCE DAILY Patient taking differently: Take 20 mg by mouth daily with supper.  03/04/18  Yes [name], Enobong, MD  ACCU-CHEK SOFTCLIX LANCETS lancets Use for once daily testing of blood sugar 06/02/17   Newlin, Odette HornsEnobong, MD  acetaminophen-codeine (TYLENOL #3) 300-30 MG tablet TAKE 1 TABLET BY MOUTH AT BEDTIME AS NEEDED FOR MODERATE PAIN. Patient not taking: Reported on 03/27/2018 09/09/17   [name], Enobong, MD  albuterol (PROVENTIL) (2.5 MG/3ML) 0.083% nebulizer solution Take 3 mLs (2.5 mg total) by nebulization every 6 (six) hours as needed for wheezing or shortness of breath. Patient not taking: Reported on 03/27/2018 04/18/15   [name], Enobong, MD  Blood Glucose Monitoring Suppl (ACCU-CHEK AVIVA) device Use as instructed1 times daily before meals 06/02/17   [name], Enobong, MD  diclofenac sodium (VOLTAREN) 1 % GEL APPLY 4 GRAMS TOPICALLY 4 (FOUR) TIMES DAILY Patient not taking: Reported on 03/27/2018 11/17/17   [name], Enobong, MD  ergocalciferol (DRISDOL) 50000 units capsule Take 1 capsule (50,000 Units total) by mouth once a week. Patient not taking: Reported on 03/19/2018 06/03/17   [name], Enobong, MD  glucose blood (ACCU-CHEK AVIVA) test strip Use as instructed for 1 times daily testing of blood sugar 06/02/17   [name], Enobong, MD  Lancet Devices [facility](ACCU-CHEK SOFTCLIX) lancets Use as instructed for once daily testing of blood sugar 11/28/15   [name], Enobong, MD  Misc. Devices MISC Shower chair.  Diagnosis: CHF 02/18/18   [name], Enobong, MD    Inpatient Medications: Scheduled Meds: . sodium chloride   Intravenous Once  . sodium chloride   Intravenous Once  .  atorvastatin  40 mg Oral q1800  . digoxin  62.5 mcg Oral Daily  . furosemide  20 mg Intravenous Once  . furosemide  40 mg Intravenous Daily  . insulin aspart  0-15 Units Subcutaneous TID WC  . insulin aspart  0-5 Units Subcutaneous QHS  . insulin glargine  30 Units Subcutaneous Q2200  . ipratropium  0.5 mg Nebulization BID  . isosorbide mononitrate  30 mg Oral Daily  . latanoprost  1 drop Both Eyes Daily  . levalbuterol  0.63 mg Nebulization BID  . sodium chloride flush  3 mL Intravenous Q12H  . spironolactone  12.5 mg Oral Daily   Continuous Infusions: . sodium chloride     PRN Meds: sodium chloride, acetaminophen, ALPRAZolam, meclizine, ondansetron (ZOFRAN) IV, sodium chloride flush, traZODone  Allergies:    Allergies  Allergen Reactions  . Pork-Derived Products Other (See Comments)    Religious preference    Social History:   Social History   Socioeconomic History  . Marital status: Divorced    Spouse name: Not on file  . Number of children: 3  . Years of education: Not on file  . Highest education level: Not on file  Occupational History  . Occupation: disabled  Social Needs  . Financial resource strain: Not on file  . Food insecurity:    Worry: Not on file    Inability: Not on file  . Transportation needs:    Medical: Not on file    Non-medical: Not on file  Tobacco Use  . Smoking status: Current Every Day Smoker    Packs/day: 0.50    Years: 40.00    Pack years: 20.00    Types: Cigarettes  . Smokeless tobacco: Never Used  Substance and Sexual Activity  . Alcohol use: No    Alcohol/week: 0.0 standard drinks  . Drug use: No  . Sexual activity: Not Currently  Lifestyle  . Physical activity:    Days per week: Not on file    Minutes per session: Not on file  . Stress: Not on file  Relationships  . Social connections:    Talks on phone: Not on file    Gets together: Not on file    Attends religious service: Not on file    Active member of club or  organization: Not on file    Attends meetings of clubs or organizations: Not on file    Relationship status: Not on file  . Intimate partner violence:    Fear of current or ex partner: Not on file    Emotionally abused: Not on file    Physically abused: Not on file    Forced sexual activity: Not on file  Other Topics Concern  . Not on file  Social History Narrative   Has an apartment with a roommate. He was living on the streets in 03-Jan-2013.  He reports that his father died in [place] in 2013-01-03.  He is divorced.  He is no longer estranged from his son, but still from his daughter who lives locally.  Neither of his parents, nor any siblings have any history of CAD.    Family History:    Family History  Problem Relation Age of Onset  . Diabetes Mother      ROS:  Please see the history of present illness.   All other ROS reviewed and negative.     Physical Exam/Data:   Vitals:   03/28/18 0110 03/28/18 0125 03/28/18 0338 03/28/18 0726  BP: 115/73 121/67 100/76   Pulse: (!) 109 (!) 114 (!) 106   Resp: 20 (!) 22    Temp: 97.6 F (36.4 C) 98.5 F (36.9 C) 97.8 F (36.6 C)   TempSrc: Oral Oral Oral   SpO2: 94% 97% 95% 97%  Weight:      Height:        Intake/Output Summary (Last 24 hours) at 03/28/2018 0748 Last  data filed at 03/28/2018 0338 Gross per 24 hour  Intake 1379.42 ml  Output 350 ml  Net 1029.42 ml   Filed Weights   03/27/18 1118 03/27/18 1715  Weight: 104.8 kg 107.3 kg   Body mass index is 39.37 kg/m.  General:  Well nourished, well developed, in no acute distress HEENT: normal Lymph: no adenopathy Neck: Very faint JVD Endocrine:  No thryomegaly Vascular: No carotid bruits; pedal pulses 2+ Cardiac:  normal S1, S2; irregularly irregular rhythm; no murmur  Lungs:  clear to auscultation bilaterally, no wheezing, rhonchi or rales  Abd: soft, nontender, no hepatomegaly  Ext: 1+ lower leg edema Musculoskeletal:  No deformities, BUE and BLE strength  normal and equal Skin: warm and dry  Neuro:  CNs 2-12 intact, no focal abnormalities noted Psych:  Normal affect   EKG:  The EKG was personally reviewed and demonstrates: Atrial fibrillation at 105 bpm Telemetry:  Telemetry was personally reviewed and demonstrates: A. fib in the 110s  Relevant CV Studies:  [facility] 04/23/17  Ost RCA to Prox RCA lesion is 100% stenosed.  Ost Cx to Prox Cx lesion is 99% stenosed.  Ost LAD to Prox LAD lesion is 40% stenosed.  Prox LAD to Mid LAD lesion is 40% stenosed.  Dist LAD lesion is 70% stenosed.  Findings:  Ao = 126/66 (86)  LV = 118/14 RA = 8 RV = 44/8 PA = 52/17 (34) PCW = 17 Fick cardiac output/index = 4.2/2.0 PVR = 4.0 WU Ao sat = 98% PA sat = 60%, 61%  Laboratory Data:  Chemistry Recent Labs  Lab 03/27/18 1124 03/28/18 0546  NA 130* 128*  K 3.5 4.0  CL 96* 94*  CO2 25 25  GLUCOSE 168* 190*  BUN 37* 41*  CREATININE 1.68* 1.73*  CALCIUM 8.3* 8.1*  GFRNONAA 43* 42*  GFRAA 50* 48*  ANIONGAP 9 9    Recent Labs  Lab 03/28/18 0546  PROT 7.3  ALBUMIN 2.8*  AST 24  ALT 15  ALKPHOS 195*  BILITOT 1.0   Hematology Recent Labs  Lab 03/27/18 1124 03/27/18 1253 03/28/18 0546  WBC 6.4  --  7.8  RBC 3.20* 3.02* 3.57*  HGB 5.3*  --  6.9*  HCT 21.3*  --  24.3*  MCV 66.6*  --  68.1*  MCH 16.6*  --  19.3*  MCHC 24.9*  --  28.4*  RDW 24.0*  --  25.7*  PLT 244  --  254   Cardiac Enzymes Recent Labs  Lab 03/27/18 1808 03/27/18 2342 03/28/18 0546  TROPONINI 0.04* 0.04* 0.04*    Recent Labs  Lab 03/27/18 1133  TROPIPOC 0.02    BNP Recent Labs  Lab 03/27/18 1124  BNP 499.7*    DDimer No results for input(s): DDIMER in the last 168 hours.  Radiology/Studies:  Dg Chest 2 View  Result Date: 03/28/2018 CLINICAL DATA:  Shortness of breath and left-sided chest pain 2 days. EXAM: CHEST - 2 VIEW COMPARISON:  03/27/2018 FINDINGS: Sternotomy wires and left-sided pacemaker unchanged. Lungs are hypoinflated and  demonstrate a stable small to moderate right pleural effusion likely with associated basilar atelectasis. Infection in the right base is possible. There is mild prominence of the perihilar markings suggesting mild degree of vascular congestion. Mild stable cardiomegaly. Remainder of the exam is unchanged. IMPRESSION: Mild stable cardiomegaly with minimal vascular congestion and stable small to moderate right pleural effusion likely with associated basilar atelectasis. Infection in the right base is possible. Electronically Signed  By: Elberta Fortisaniel  Boyle M.D.   On: 03/28/2018 07:35   Dg Chest 2 View  Result Date: 03/27/2018 CLINICAL DATA:  Chest pain EXAM: CHEST - 2 VIEW COMPARISON:  09/12/2017 FINDINGS: Cardiac shadow remains enlarged. Defibrillator is again noted and stable as are postoperative changes. Increasing right-sided pleural effusion with likely underlying atelectasis is present. Mild central vascular congestion is noted as well. IMPRESSION: Mild central vascular congestion. Increasing right basilar effusion and likely underlying atelectasis. Electronically Signed   By: Alcide CleverMark  Lukens M.D.   On: 03/27/2018 12:02    Assessment and Plan:   Acute on chronic systolic heart failure NYHA IIIb -Echo 10/2015 EF 25-30%.  Echo 08/2017 EF 20-25%.  Patient has [employer] ICD. -R/LHC 03/2017 with stable CAD and compensated filling pressures. -Maintenance medications include spironolactone 25 mg daily, digoxin, hydralazine and Imdur, metolazone 2.5 mg 2 times per week, torsemide 80 mg twice daily -Patient has social issues, lives in low income housing and has no family support.  He is followed by para medicine as an outpatient. -Patient states that since his last office visit on 12/5 he has been compliant with his medications.  He feels that his diuretics are no longer working for him. -Chest x-ray showed mild central vascular congestion.  BNP was 499. -Currently ordered Lasix 40 mg IV daily with additional  Lasix given around his transfusion. -Continue daily weights and strict I&O  Anemia -Patient found to have hemoglobin of 5.3 on admission.  He has been transfused with 2 units of packed red blood cells -Patient has a history of GI bleed in the past -Primary team is consulting gastroenterology -Xarelto on hold given acute anemia  Acute kidney injury -Urine creatinine 1.73, likely related to hypoperfusion with severe anemia -Continue to monitor renal function closely  Chronic atrial fibrillation -In the office noted to be uncontrolled and patient has refused cardioversion.  No longer on amiodarone due to changes on chest CT -Patient noted fast heartbeat starting 3 to 4 days ago.  He says it feels better now. -Patient has been on Xarelto for anticoagulation, only on hold due to anemia -He continues on digoxin -Currently A. fib in the 110s  [facility] -Continues on statin  Hypertension -Home medications include hydralazine 25 mg 3 times daily, Imdur 30 mg daily torsemide 80 mg twice daily -Currently hydralazine is on hold due to soft blood pressure.  Spironolactone and Imdur have been continued.  He is being given IV Lasix  Tobacco abuse -Continues to smoke half pack per day.  Hyperlipidemia -On atorvastatin 40 mg daily   For questions or updates, please contact [facility] Please consult [email or web address] for contact info under     Signed, Berton BonJanine Hammond, NP  03/28/2018 7:48 AM   Personally seen and examined. Agree with above.  Admitted with shortness of breath, hemoglobin found to be 5.3.  On chronic Xarelto therapy which is currently being held because of anemia.  A. fib.  Has had a history of GI bleeds in the past.  Gastroenterology team consulted.  Has extremely complex cardiac history as noted above.  States that he is feeling much better since blood transfusion, 2 units.  No significant shortness of breath.  Does have some left  sided chest discomfort.  Also states that he does not think his diuretics have been working well at home.  GEN: Well nourished, well developed, in no acute distress  HEENT: normal, pale Neck: no JVD, carotid bruits,  or masses Cardiac: Irregularly irregular; no murmurs, rubs, or gallops, 1+ bilateral lower extremity edema  Respiratory: Blunting at right base  GI: soft, nontender, nondistended, + BS MS: no deformity or atrophy  Skin: warm and dry, no rash Neuro:  Alert and Oriented x 3, Strength and sensation are intact Psych: euthymic mood, full affect  EKG personally reviewed shows A. fib 105 bpm.  Telemetry also personally reviewed shows A. fib in the 100-110 range.  Relevant CV Studies:  [facility] 04/23/17  Ost RCA to Prox RCA lesion is 100% stenosed.  Ost Cx to Prox Cx lesion is 99% stenosed.  Ost LAD to Prox LAD lesion is 40% stenosed.  Prox LAD to Mid LAD lesion is 40% stenosed.  Dist LAD lesion is 70% stenosed.  Findings:  Ao = 126/66 (86)  LV = 118/14 RA = 8 RV = 44/8 PA = 52/17 (34) PCW = 17 Fick cardiac output/index = 4.2/2.0 PVR = 4.0 WU Ao sat = 98% PA sat = 60%, 61%  Assessment and plan:  Acute blood loss anemia - Likely GI source, perhaps slow bleed.  Iron studies quite low.  Has had prior history of GI bleed.  FOBT however was negative.  Still think it is a good idea to consult GI. - He is getting another unit of blood.  This will be his third.  Hemoglobin was 5.3 on admission. -Obviously continue to hold Xarelto.  Acute on chronic systolic heart failure NYHA IIIb -Shortness of breath was probably mostly related to his severe anemia.  He does however have right lower lobe effusion.  Agree with Lasix administration especially surrounding his PRBC. -He does state that he has been compliant with his medications according to recent December office visit.  But he does feel like the diuretics no longer work.  Acute kidney injury - Creatinine 1.73 likely  hypoperfusion severe anemia.  Monitor closely.  Chronic atrial fibrillation -Difficult to control refused cardioversion no longer on amiodarone due to lung changes on CT - Obviously stopping Xarelto because of presumed blood loss anemia - Heart rate is currently 100-110, continue with digoxin.  Reasonable rate control.  Tobacco use -Continue to encourage cessation.  Still smokes about 1/2 pack/day.  Hyperlipidemia -Atorvastatin high intensity dose 40 mg.  We will continue to follow with you.  Donato Schultz, MD

## 2018-03-28 NOTE — Consult Note (Signed)
Reason for Consult: Anemia Referring Physician: Hospital team  Jacob [name] is an 63 y.o. male.  HPI: Patient seen and examined in hospital computer chart reviewed and his case discussed with the hospital team and other than abdominal swelling and leg swelling he has no GI complaints and he has not seen any blood and he did have an endoscopy a few years ago for a bleeding ulcer but he has not had any other GI work-up his family history is negative for any GI problems and he is on Xarelto no other aspirin or nonsteroidals at home and specifically he has no swallowing problems abdominal pain or problems with his bowel movements or any blood or black tarry stools  Past Medical History:  Diagnosis Date  . Atrial fibrillation (HCC)    RVR 10/2014  . Automatic implantable cardioverter-defibrillator in situ   . CAD (coronary artery disease) Sept 2013   s/p cardiac cath showing occlusion of small RCA with collaterals  . CHF (congestive heart failure) (HCC)    20 to 25 % EF and RV dysfunction by 07/2014 echo   . Chronic anticoagulation    on xarelto.   . High cholesterol   . Hypertension   . Myocardial infarction (HCC) 2014  . Noncompliance    homelessness contributing.   . Peripheral arterial disease (HCC)   . Type II diabetes mellitus (HCC)     Past Surgical History:  Procedure Laterality Date  . CARDIAC CATHETERIZATION  09/2012  . CORONARY ANGIOPLASTY WITH STENT PLACEMENT  11/2011   "1"  . CORONARY ARTERY BYPASS GRAFT  09/2012   2 vessels per patient Jacob [name]([place])   . ESOPHAGOGASTRODUODENOSCOPY N/A 11/28/2014   Procedure: ESOPHAGOGASTRODUODENOSCOPY (EGD);  Surgeon: Beverley FiedlerJay M Pyrtle, MD;  [facility];  Service: Endoscopy;  Laterality: N/A;  . ILIAC ARTERY STENT Right 08/30/2013  . IMPLANTABLE CARDIOVERTER DEFIBRILLATOR IMPLANT     [place] in 07/2012; [employer]  . LEFT AND RIGHT HEART CATHETERIZATION WITH CORONARY ANGIOGRAM N/A 12/02/2011   Procedure: LEFT AND RIGHT HEART CATHETERIZATION  WITH CORONARY ANGIOGRAM;  Surgeon: Kathleene Hazelhristopher D McAlhany, MD;  [facility];  Service: Cardiovascular;  Laterality: N/A;  . LOWER EXTREMITY ANGIOGRAM N/A 08/30/2013   Procedure: LOWER EXTREMITY ANGIOGRAM;  Surgeon: Runell GessJonathan J Berry, MD;  [facility];  Service: Cardiovascular;  Laterality: N/A;  . LOWER EXTREMITY ANGIOGRAM N/A 12/02/2013   Procedure: LOWER EXTREMITY ANGIOGRAM;  Surgeon: Runell GessJonathan J Berry, MD;  [facility];  Service: Cardiovascular;  Laterality: N/A;  . RIGHT/LEFT HEART CATH AND CORONARY/GRAFT ANGIOGRAPHY N/A 04/23/2017   Procedure: RIGHT/LEFT HEART CATH AND CORONARY/GRAFT ANGIOGRAPHY;  Surgeon: Dolores PattyBensimhon, Daniel R, MD;  [facility]: [facility] INVASIVE CV LAB;  Service: Cardiovascular;  Laterality: N/A;    Family History  Problem Relation Age of Onset  . Diabetes Mother     Social History:  reports that he has been smoking cigarettes. He has a 20.00 pack-year smoking history. He has never used smokeless tobacco. He reports that he does not drink alcohol or use drugs.  Allergies:  Allergies  Allergen Reactions  . Pork-Derived Products Other (See Comments)    Religious preference    Medications: I have reviewed the patient's current medications.  Results for orders placed or performed during the hospital encounter of 03/27/18 (from the past 48 hour(s))  Basic metabolic panel     Status: Abnormal   Collection Time: 03/27/18 11:24 AM  Result Value Ref Range   Sodium 130 (L) 135 - 145 mmol/L  Potassium 3.5 3.5 - 5.1 mmol/L   Chloride 96 (L) 98 - 111 mmol/L   CO2 25 22 - 32 mmol/L   Glucose, Bld 168 (H) 70 - 99 mg/dL   BUN 37 (H) 8 - 23 mg/dL   Creatinine, Ser 2.95 (H) 0.61 - 1.24 mg/dL   Calcium 8.3 (L) 8.9 - 10.3 mg/dL   GFR calc non Af Amer 43 (L) >60 mL/min   GFR calc Af Amer 50 (L) >60 mL/min   Anion gap 9 5 - 15    Comment: Performed at [facility], [address] [place], [place] [address]  CBC with Differential     Status: Abnormal    Collection Time: 03/27/18 11:24 AM  Result Value Ref Range   WBC 6.4 4.0 - 10.5 K/uL   RBC 3.20 (L) 4.22 - 5.81 MIL/uL   Hemoglobin 5.3 (LL) 13.0 - 17.0 g/dL    Comment: REPEATED TO VERIFY Reticulocyte Hemoglobin testing may be clinically indicated, consider ordering this additional test [id number] THIS CRITICAL RESULT HAS VERIFIED AND BEEN CALLED TO M MAYER RN BY ALLISON BENNETT ON 01 03 2020 AT 1236, AND HAS BEEN READ BACK.     HCT 21.3 (L) 39.0 - 52.0 %   MCV 66.6 (L) 80.0 - 100.0 fL   MCH 16.6 (L) 26.0 - 34.0 pg   MCHC 24.9 (L) 30.0 - 36.0 g/dL   RDW 62.9 (H) 52.8 - 41.3 %   Platelets 244 150 - 400 K/uL   nRBC 0.6 (H) 0.0 - 0.2 %   Neutrophils Relative % 85 %   Neutro Abs 5.4 1.7 - 7.7 K/uL   Band Neutrophils 0 %   Lymphocytes Relative 9 %   Lymphs Abs 0.6 (L) 0.7 - 4.0 K/uL   Monocytes Relative 1 %   Monocytes Absolute 0.1 0.1 - 1.0 K/uL   Eosinophils Relative 0 %   Eosinophils Absolute 0.0 0.0 - 0.5 K/uL   Basophils Relative 3 %   Basophils Absolute 0.2 (H) 0.0 - 0.1 K/uL   WBC Morphology MILD LEFT SHIFT (1-5% METAS, OCC MYELO, OCC BANDS)    RBC Morphology Acanthocytes present     Comment: POLYCHROMASIA PRESENT BURR CELLS TEARDROP CELLS    nRBC 1 (H) 0 /100 WBC   Myelocytes 2 %    Comment: Performed at [facility], [address] [place], [place] [address]  Brain natriuretic peptide     Status: Abnormal   Collection Time: 03/27/18 11:24 AM  Result Value Ref Range   B Natriuretic Peptide 499.7 (H) 0.0 - 100.0 pg/mL    Comment: Performed at [facility], [address] [place], [place] [address]  I-stat troponin, ED     Status: None   Collection Time: 03/27/18 11:33 AM  Result Value Ref Range   Troponin i, poc 0.02 0.00 - 0.08 ng/mL   Comment 3            Comment: Due to the release kinetics of cTnI, a negative result within the first hours of the onset of symptoms does not rule out myocardial infarction with certainty. If myocardial infarction is  still suspected, repeat the test at appropriate intervals.   Prepare RBC     Status: None   Collection Time: 03/27/18 11:51 AM  Result Value Ref Range   Order Confirmation      ORDER PROCESSED BY BLOOD BANK Performed at [facility], [address], [place], [place] [address]  Type and screen [facility]     Status: None (Preliminary result)   Collection Time: 03/27/18 11:51 AM  Result Value Ref Range   ABO/RH(D) O POS    Antibody Screen NEG    Sample Expiration 03/30/2018    Unit Number [id number]    Blood Component Type RED CELLS,LR    Unit division 00    Status of Unit ISSUED    Transfusion Status OK TO TRANSFUSE    Crossmatch Result Compatible    Unit Number [id number]    Blood Component Type RBC LR PHER2    Unit division 00    Status of Unit ISSUED    Transfusion Status OK TO TRANSFUSE    Crossmatch Result Compatible    Unit Number [id number]    Blood Component Type RBC LR PHER1    Unit division 00    Status of Unit ISSUED    Transfusion Status OK TO TRANSFUSE    Crossmatch Result      Compatible Performed at [facility], [address], [place], [place] [address]   Vitamin B12     Status: None   Collection Time: 03/27/18 12:53 PM  Result Value Ref Range   Vitamin B-12 861 180 - 914 pg/mL    Comment: (NOTE) This assay is not validated for testing neonatal or myeloproliferative syndrome specimens for Vitamin B12 levels. Performed at [facility], [address], [place], [place] [address]   Folate     Status: None   Collection Time: 03/27/18 12:53 PM  Result Value Ref Range   Folate 15.9 >5.9 ng/mL    Comment: Performed at [facility], [address], [place], [place] [address]  Iron and TIBC     Status: Abnormal   Collection Time: 03/27/18 12:53 PM  Result Value Ref Range   Iron 19 (L) 45 - 182 ug/dL   TIBC 536 644 - 034 ug/dL   Saturation Ratios 5 (L) 17.9 - 39.5 %   UIBC 376 ug/dL    Comment:  Performed at [facility], [address], [place], [place] [address]  Ferritin     Status: Abnormal   Collection Time: 03/27/18 12:53 PM  Result Value Ref Range   Ferritin 8 (L) 24 - 336 ng/mL    Comment: Performed at [facility], [address] [place], [place] [address]  Reticulocytes     Status: Abnormal   Collection Time: 03/27/18 12:53 PM  Result Value Ref Range   Retic Ct Pct 2.3 0.4 - 3.1 %   RBC. 3.02 (L) 4.22 - 5.81 MIL/uL   Retic Count, Absolute 68.0 19.0 - 186.0 K/uL   Immature Retic Fract 19.9 (H) 2.3 - 15.9 %    Comment: Performed at [facility], [address] [place], [place] [address]  POC occult blood, ED     Status: None   Collection Time: 03/27/18  1:29 PM  Result Value Ref Range   Fecal Occult Bld NEGATIVE NEGATIVE  Prepare RBC     Status: None   Collection Time: 03/27/18  2:39 PM  Result Value Ref Range   Order Confirmation      BB SAMPLE OR UNITS ALREADY AVAILABLE Performed at [facility], [address] [place], [place] [address]   Urinalysis, Routine w reflex microscopic     Status: None   Collection Time: 03/27/18  3:20 PM  Result Value Ref Range  Color, Urine YELLOW YELLOW   APPearance CLEAR CLEAR   Specific Gravity, Urine 1.006 1.005 - 1.030   pH 5.0 5.0 - 8.0   Glucose, UA NEGATIVE NEGATIVE mg/dL   Hgb urine dipstick NEGATIVE NEGATIVE   Bilirubin Urine NEGATIVE NEGATIVE   Ketones, ur NEGATIVE NEGATIVE mg/dL   Protein, ur NEGATIVE NEGATIVE mg/dL   Nitrite NEGATIVE NEGATIVE   Leukocytes, UA NEGATIVE NEGATIVE    Comment: Performed at [facility], [address], [place], [place] [address]  Urine rapid drug screen (hosp performed)     Status: None   Collection Time: 03/27/18  3:20 PM  Result Value Ref Range   Opiates NONE DETECTED NONE DETECTED   Cocaine NONE DETECTED NONE DETECTED   Benzodiazepines NONE DETECTED NONE DETECTED   Amphetamines NONE DETECTED NONE DETECTED   Tetrahydrocannabinol NONE DETECTED  NONE DETECTED   Barbiturates NONE DETECTED NONE DETECTED    Comment: (NOTE) DRUG SCREEN FOR MEDICAL PURPOSES ONLY.  IF CONFIRMATION IS NEEDED FOR ANY PURPOSE, NOTIFY LAB WITHIN 5 DAYS. LOWEST DETECTABLE LIMITS FOR URINE DRUG SCREEN Drug Class                     Cutoff (ng/mL) Amphetamine and metabolites    1000 Barbiturate and metabolites    200 Benzodiazepine                 200 Tricyclics and metabolites     300 Opiates and metabolites        300 Cocaine and metabolites        300 THC                            50 Performed at [facility], [address], [place], [place] [address]   Glucose, capillary     Status: Abnormal   Collection Time: 03/27/18  5:26 PM  Result Value Ref Range   Glucose-Capillary 63 (L) 70 - 99 mg/dL  Magnesium     Status: None   Collection Time: 03/27/18  6:08 PM  Result Value Ref Range   Magnesium 2.0 1.7 - 2.4 mg/dL    Comment: Performed at [facility], [address], [place], [place] [address]  Troponin I - Now Then Q6H     Status: Abnormal   Collection Time: 03/27/18  6:08 PM  Result Value Ref Range   Troponin I 0.04 (HH) <0.03 ng/mL    Comment: CRITICAL RESULT CALLED TO, READ BACK BY AND VERIFIED WITH: Lacie Scotts 03/27/18 2025 WAYK Performed at [facility], [address] [place], [place] [address]   TSH     Status: None   Collection Time: 03/27/18  7:00 PM  Result Value Ref Range   TSH 4.191 0.350 - 4.500 uIU/mL    Comment: Performed by a 3rd Generation assay with a functional sensitivity of <=0.01 uIU/mL. Performed at [facility], [address] [place], [place] [address]   Glucose, capillary     Status: Abnormal   Collection Time: 03/27/18  8:48 PM  Result Value Ref Range   Glucose-Capillary 270 (H) 70 - 99 mg/dL  Troponin I - Now Then Q6H     Status: Abnormal   Collection Time: 03/27/18 11:42 PM  Result Value Ref Range   Troponin I 0.04 (HH) <0.03 ng/mL    Comment: CRITICAL VALUE NOTED.  VALUE IS  CONSISTENT WITH PREVIOUSLY REPORTED AND  CALLED VALUE. Performed at [facility], [address], [place], [place] [address]   Digoxin level     Status: Abnormal   Collection Time: 03/27/18 11:42 PM  Result Value Ref Range   Digoxin Level 0.4 (L) 0.8 - 2.0 ng/mL    Comment: Performed at [facility], [address], [place], [place] [address]  Troponin I - Now Then Q6H     Status: Abnormal   Collection Time: 03/28/18  5:46 AM  Result Value Ref Range   Troponin I 0.04 (HH) <0.03 ng/mL    Comment: CRITICAL VALUE NOTED.  VALUE IS CONSISTENT WITH PREVIOUSLY REPORTED AND CALLED VALUE. Performed at [facility], [address], [place], [place] [address]   CBC with Differential/Platelet     Status: Abnormal   Collection Time: 03/28/18  5:46 AM  Result Value Ref Range   WBC 7.8 4.0 - 10.5 K/uL   RBC 3.57 (L) 4.22 - 5.81 MIL/uL   Hemoglobin 6.9 (LL) 13.0 - 17.0 g/dL    Comment: REPEATED TO VERIFY POST TRANSFUSION SPECIMEN CRITICAL VALUE NOTED.  VALUE IS CONSISTENT WITH PREVIOUSLY REPORTED AND CALLED VALUE. Reticulocyte Hemoglobin testing may be clinically indicated, consider ordering this additional test [id number]    HCT 24.3 (L) 39.0 - 52.0 %   MCV 68.1 (L) 80.0 - 100.0 fL   MCH 19.3 (L) 26.0 - 34.0 pg   MCHC 28.4 (L) 30.0 - 36.0 g/dL   RDW 62.1 (H) 30.8 - 65.7 %   Platelets 254 150 - 400 K/uL   nRBC 0.9 (H) 0.0 - 0.2 %   Neutrophils Relative % 74 %   Neutro Abs 5.9 1.7 - 7.7 K/uL   Lymphocytes Relative 13 %   Lymphs Abs 1.0 0.7 - 4.0 K/uL   Monocytes Relative 10 %   Monocytes Absolute 0.7 0.1 - 1.0 K/uL   Eosinophils Relative 1 %   Eosinophils Absolute 0.1 0.0 - 0.5 K/uL   Basophils Relative 1 %   Basophils Absolute 0.1 0.0 - 0.1 K/uL   Immature Granulocytes 1 %   Abs Immature Granulocytes 0.05 0.00 - 0.07 K/uL   Acanthocytes PRESENT    Tear Drop Cells PRESENT    Polychromasia PRESENT     Comment: Performed at [facility], [address],  [place], [place] [address]  Comprehensive metabolic panel     Status: Abnormal   Collection Time: 03/28/18  5:46 AM  Result Value Ref Range   Sodium 128 (L) 135 - 145 mmol/L   Potassium 4.0 3.5 - 5.1 mmol/L   Chloride 94 (L) 98 - 111 mmol/L   CO2 25 22 - 32 mmol/L   Glucose, Bld 190 (H) 70 - 99 mg/dL   BUN 41 (H) 8 - 23 mg/dL   Creatinine, Ser 2.95 (H) 0.61 - 1.24 mg/dL   Calcium 8.1 (L) 8.9 - 10.3 mg/dL   Total Protein 7.3 6.5 - 8.1 g/dL   Albumin 2.8 (L) 3.5 - 5.0 g/dL   AST 24 15 - 41 U/L   ALT 15 0 - 44 U/L   Alkaline Phosphatase 195 (H) 38 - 126 U/L   Total Bilirubin 1.0 0.3 - 1.2 mg/dL   GFR calc non Af Amer 42 (L) >60 mL/min   GFR calc Af Amer 48 (L) >60 mL/min   Anion gap 9 5 - 15    Comment: Performed at [facility], [address], [place], [place] [address]  Magnesium  Status: None   Collection Time: 03/28/18  5:46 AM  Result Value Ref Range   Magnesium 2.0 1.7 - 2.4 mg/dL    Comment: Performed at [facility], [address], [place], [place] [address]  Phosphorus     Status: None   Collection Time: 03/28/18  5:46 AM  Result Value Ref Range   Phosphorus 3.1 2.5 - 4.6 mg/dL    Comment: Performed at [facility], [address], [place], [place] [address]  Prepare RBC     Status: None   Collection Time: 03/28/18  7:34 AM  Result Value Ref Range   Order Confirmation      ORDER PROCESSED BY BLOOD BANK Performed at [facility], [address], [place], [place] [address]   Glucose, capillary     Status: Abnormal   Collection Time: 03/28/18  7:45 AM  Result Value Ref Range   Glucose-Capillary 156 (H) 70 - 99 mg/dL  Glucose, capillary     Status: Abnormal   Collection Time: 03/28/18 11:15 AM  Result Value Ref Range   Glucose-Capillary 217 (H) 70 - 99 mg/dL    Dg Chest 2 View  Result Date: 03/28/2018 CLINICAL DATA:  Shortness of breath and left-sided chest pain 2 days. EXAM: CHEST - 2 VIEW COMPARISON:  03/27/2018 FINDINGS: Sternotomy wires  and left-sided pacemaker unchanged. Lungs are hypoinflated and demonstrate a stable small to moderate right pleural effusion likely with associated basilar atelectasis. Infection in the right base is possible. There is mild prominence of the perihilar markings suggesting mild degree of vascular congestion. Mild stable cardiomegaly. Remainder of the exam is unchanged. IMPRESSION: Mild stable cardiomegaly with minimal vascular congestion and stable small to moderate right pleural effusion likely with associated basilar atelectasis. Infection in the right base is possible. Electronically Signed   By: Elberta Fortis M.D.   On: 03/28/2018 07:35   Dg Chest 2 View  Result Date: 03/27/2018 CLINICAL DATA:  Chest pain EXAM: CHEST - 2 VIEW COMPARISON:  09/12/2017 FINDINGS: Cardiac shadow remains enlarged. Defibrillator is again noted and stable as are postoperative changes. Increasing right-sided pleural effusion with likely underlying atelectasis is present. Mild central vascular congestion is noted as well. IMPRESSION: Mild central vascular congestion. Increasing right basilar effusion and likely underlying atelectasis. Electronically Signed   By: Alcide Clever M.D.   On: 03/27/2018 12:02    ROS negative except above Blood pressure (!) 100/59, pulse 92, temperature 98.1 F (36.7 C), temperature source Oral, resp. rate (!) 22, height 5\' 5"  (1.651 m), weight 107.3 kg, SpO2 95 %. Physical Exam no acute distress sitting comfortably in the bed decreased breath sounds decreased heart sounds abdomen is soft large nontender obvious pedal edema increased BUN and creatinine decreased hemoglobin currently getting a transfusion iron deficient Assessment/Plan: Multiple medical problems including anemia on blood thinners with history of duodenal ulcer Plan: We discussed endoscopy including the risks benefits and methods and will proceed tomorrow and depending on those findings will need either inpatient or outpatient colonoscopy  which he has never had  Maxxon Schwanke E 03/28/2018, 1:43 PM

## 2018-03-28 NOTE — Progress Notes (Signed)
  Echocardiogram 2D Echocardiogram has been performed.  Delcie Roch 03/28/2018, 4:40 PM

## 2018-03-28 NOTE — Evaluation (Signed)
Physical Therapy Evaluation Patient Details Name: Jacob [name] MRN: [id number] DOB: November 13, 1955 Today's Date: 03/28/2018   History of Present Illness  Jacob [name] is a 63 y.o. male with a hx of systolic heart failure with EF 20-25% status post ICD 2014, RV dysfunction, CAD s/p CABG x2 w/ maze 2014, diabetes type 2 on insulin, chronic A. fib on Xarelto, GI bleed 11/2014, hyperlipidemia and noncompliance who is being seen today for the evaluation of atrial fibrillation and CHF at the request of Dr Marland Mcalpine.  HGB 6.9 and gettting blood transfusion todday.  Clinical Impression  Pt admitted with above diagnosis. Pt currently with functional limitations due to the deficits listed below (see PT Problem List). Pt was able to ambulate without device with fair stability and able to self correct with slight LOB.  Pt  Would benefit from use of cane and pt made aware of this and states he can get one.  Pt should progress well.  Pt will benefit from skilled PT to increase their independence and safety with mobility to allow discharge to the venue listed below.      Follow Up Recommendations Home health PT;Supervision/Assistance - 24 hour    Equipment Recommendations  Cane    Recommendations for Other Services       Precautions / Restrictions Precautions Precautions: Fall Restrictions Weight Bearing Restrictions: No      Mobility  Bed Mobility               General bed mobility comments: NT in recliner  Transfers Overall transfer level: Needs assistance Equipment used: 1 person hand held assist;None Transfers: Sit to/from Stand Sit to Stand: Supervision            Ambulation/Gait Ambulation/Gait assistance: Min guard Gait Distance (Feet): 110 Feet Assistive device: None;1 person hand held assist Gait Pattern/deviations: Step-through pattern;Decreased stride length   Gait velocity interpretation: <1.31 ft/sec, indicative of household ambulator General Gait Details: Pt moves  cautiously.  Pt slightly unsteady but did not lose balance is able to self correct.   Stairs            Wheelchair Mobility    Modified Rankin (Stroke Patients Only)       Balance Overall balance assessment: Needs assistance Sitting-balance support: No upper extremity supported;Feet supported Sitting balance-Leahy Scale: Fair     Standing balance support: Single extremity supported;No upper extremity supported;During functional activity Standing balance-Leahy Scale: Fair Standing balance comment: can stand statically without assist or UE support                             Pertinent Vitals/Pain Pain Assessment: No/denies pain    Home Living Family/patient expects to be discharged to:: Private residence Living Arrangements: Alone Available Help at Discharge: Family;Friend(s);Available PRN/intermittently Type of Home: Apartment Home Access: Level entry     Home Layout: One level Home Equipment: None      Prior Function Level of Independence: Independent               Hand Dominance        Extremity/Trunk Assessment   Upper Extremity Assessment Upper Extremity Assessment: Defer to OT evaluation    Lower Extremity Assessment Lower Extremity Assessment: Generalized weakness    Cervical / Trunk Assessment Cervical / Trunk Assessment: Normal  Communication   Communication: No difficulties  Cognition Arousal/Alertness: Awake/alert Behavior During Therapy: WFL for tasks assessed/performed Overall Cognitive Status: Within Functional Limits for tasks assessed  General Comments      Exercises     Assessment/Plan    PT Assessment Patient needs continued PT services  PT Problem List Decreased activity tolerance;Decreased balance;Decreased mobility;Decreased knowledge of use of DME;Decreased safety awareness;Decreased knowledge of precautions;Cardiopulmonary status limiting  activity       PT Treatment Interventions DME instruction;Gait training;Functional mobility training;Therapeutic activities;Therapeutic exercise;Balance training;Patient/family education    PT Goals (Current goals can be found in the Care Plan section)  Acute Rehab PT Goals Patient Stated Goal: to go home PT Goal Formulation: With patient Time For Goal Achievement: 04/11/18 Potential to Achieve Goals: Good    Frequency Min 3X/week   Barriers to discharge Decreased caregiver support      Co-evaluation               AM-PAC PT "6 Clicks" Mobility  Outcome Measure Help needed turning from your back to your side while in a flat bed without using bedrails?: A Little Help needed moving from lying on your back to sitting on the side of a flat bed without using bedrails?: A Little Help needed moving to and from a bed to a chair (including a wheelchair)?: A Little Help needed standing up from a chair using your arms (e.g., wheelchair or bedside chair)?: None Help needed to walk in hospital room?: A Little Help needed climbing 3-5 steps with a railing? : A Lot 6 Click Score: 18    End of Session Equipment Utilized During Treatment: Gait belt Activity Tolerance: Patient limited by fatigue Patient left: in chair;with call bell/phone within reach Nurse Communication: Mobility status PT Visit Diagnosis: Unsteadiness on feet (R26.81);Muscle weakness (generalized) (M62.81)    Time: 0175-1025 PT Time Calculation (min) (ACUTE ONLY): 10 min   Charges:   PT Evaluation $PT Eval Low Complexity: 1 Low          Jenah Vanasten,PT Acute Rehabilitation Services Pager:  [phone]  Office:  [phone]    Berline Lopes 03/28/2018, 1:26 PM

## 2018-03-28 NOTE — Progress Notes (Signed)
MD on call notified of pt's hgb being 6.9 after a transfusion overnight. Will await new orders & continue to monitor the pt. Pt also wanted to leave this morning but RN convinced him to stay. Sanda Linger, RN

## 2018-03-28 NOTE — Progress Notes (Addendum)
PROGRESS NOTE    Jacob [name]  ONG:[id number] DOB: Oct 13, 1955 DOA: 03/27/2018 PCP: Jacob [name]   Brief Narrative:   Jacob [name] is a [age] y.o. male with medical history significant of full medical comorbidities including atrial fibrillation,, history of AICD placement, systolic CHF, chronic anticoagulation with Xarelto due to atrial fibrillation, hyperlipidemia, hypertension, history of MI, type 2 diabetes mellitus on insulin, history of noncompliance, hypertension, history of GI bleed and gastric hemorrhage and duodenal ulceration, as well as other comorbidities who presents with a chief complaint of shortness of breath, chest pain and chest fluttering.  Patient is nondescript on his symptoms and states that his shortness of breath is progressively gotten worse but in the last 2 days significantly worsened.  He states last night he started having some chest pain and fluttering of his heart.  Also states that he had some sharp abdominal pain which is resolved.  He got concerned because of his chest pain and shortness of breath.  He states that he woke up from sleep with heaviness shortness of breath and intermittent chest pain.  He states the chest pain has been intermittent and severe with no specific alleviating or aggravating factors.  He took home aspirin.  He has not noticed any blood in his stool or his urine has not been vomiting or been nauseous.  He states that he had symptoms a few weeks ago and his PCP approximately a week ago.  Went to the CHF clinic on 02/26/2018  ED Course: In the ED had basic blood work done and was typed and screened and started transfusion of 2 units of pRBCs. Fecal occult was negative and chest x-ray did show some vascular congestion.  EKG was also done.  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 [place] [name]   Microcytic anemia   AF (atrial fibrillation) (HCC)   ICD -  in place- BS May 2014 Jacob [name]   Tobacco abuse   PVD (peripheral vascular disease) (HCC)   DM (diabetes mellitus), type 2 with peripheral vascular complications (HCC)   Atrial fibrillation with controlled ventricular response (HCC)   Atherosclerosis of native arteries of extremity with intermittent claudication (HCC)   SOB (shortness of breath)   Chronic anticoagulation   Cardiomyopathy, ischemic   CHF (congestive heart failure) (HCC)   Congestive heart disease (HCC)   Chronic systolic heart failure (HCC)  Symptomatic Anemia -Admit to stepdown unit cardiac Jacob -Patient's Hemoglobin/hematocrit on admission was 5.3/21.4 -Typed and screened and transfused 2 units PRBCs but Hb/hxt this AM was 6.9/24.3 so will transfuse an additional 1 unit -Prior to transfusion given IV Lasix 20 mg and in between units give another 20 mg -Continue diuresis with daily Lasix and defer to Cardiology to determine strength Continue monitor Jacob signs and symptoms bleeding -Patient's FOBT was negative -Continue with Xopenex Jacob breathing treatments as well as ipratropium scheduled -Anemia panel done and showed an iron level of 19, U IBC of 376, TIBC of 395, saturation of 5%, ferritin level of 8, folate level 15.9, and vitamin B12 level of 861 -Has a history of a duodenal ulcer strict hemorrhage -Continue monitor hemoglobin very carefully -Repeat CBC in the a.m. -Gastroenterology Consulted Jacob further evaluation and patient to undergo EGD in AM   Suspected acute systolic CHF with last EF of 20 to 25% -In the setting of his symptomatic anemia -Has AICD in place -CXR  showed "Mild Central Vascular congestion and Increasing right basilar effusion and likely underlying atelectasis." -Start diuresis with IV Lasix.  Patient states that his torsemide is not longer working -Takes torsemide and metolazone in the outpatient setting -We will defer to cardiology to restart these but Jacob now we will continue IV Lasix and  will increase to BID -Repeat echocardiogram -Continue spironolactone Jacob now  -We will resume Imdur but currently hold hydralazine given his lower blood pressures but will need hydralazine added back when safe -Consulted cardiology Jacob further evaluation and recommendations; -Strict I's and O's and daily weights -Patient is +1.529 Liters since Admission and Weight is Up 5 lbs -BNP was elevated 499 on admission -Continue monitor volume status carefully -Placed in stepdown unit  Atrial fibrillation -Currently not in RVR -Has refused cardioversion in the past -Continue with cardiac monitoring -Holding Xarelto given his acute anemia -Continue with home digoxin and get digoxin level -We will need further cardiac evaluation and appreciate Cardiology following   History of GI bleeding with duodenal ulceration and clipping in 2005 -Denies any melena, hematochezia, coffee-ground emesis or hematemesis -Holding Xarelto given Hemglobin of 5.3 -S/p Transfusion of 3 units of pRBC's -Patient to undergo EGD in AM   PAD -Had a last ABI done in 2015 -Follows with Dr. Allyson Sabal -C/w Statin -Continues to smoke and does not plan to quit  Chest pain rule out ACS -Placed on cardiac telemetry and cycle cardiac troponins -Troponins have been Flat at 0.04 -Check echocardiogram -Have cardiology evaluate Jacob further evaluation recommendations -Checked TSH and was 4.191 -UDS Negative -Likely with chest pain may be in the setting of his anemia -Continue to Monitor  Insulin-dependent diabetes mellitus -Hold his home glipizide -As hemoglobin A1c at his PCP office 6.5 -Continue with home regimen but reduce dosing to 30 units and place on NovoLog/scale insulin -CBG's ranging from 156-217 -Rpeat hemoglobin A1c in a.m.  Acute kidney injury in the setting of CKD stage III -In the setting of anemia and mild volume overload -BUN/Cr worsened and went from 37/1.68 -> 41/1.73 -Continue diuresis as  above -Transfuse 3 units of PRBCs -Spironolactone -Avoid other nephrotoxic medications possible -To monitor and trend renal function -Avoid IV fluid given suspected CHF -Continue to Monitor closely as has a Hx of Cardiorenal -May need Nephrology involvement   Hyperlipidemia -Continue with Atorvastatin 40 mg p.o. daily  CAD with history of occlusion of a small RCA with collaterals and history of CABG with MAZE -Continue with isosorbide mononitrate and atorvastatin  Glaucoma -Continue with Latanoprost  Hyponatremia -Likely in the setting of suspected hypervolemic hyponatremia -Na+ went from 130 -> 128 -Continue monitor and trend sodium with diuresis -Repeat CMP in a.m. -May Need Nephrology involvement   Gout -Currently holding up Allopurinol will resume when kidney function function is improved  Hypertension -Patient blood pressure was on the lower side -We will currently hold hydralazine 25 mg p.o. nightly but will continue isosorbide mononitrate -Holding home diuretics with metolazone and torsemide  Tobacco Abuse -Smoking cessation counseling given  Obesity -Estimated body mass index is 39.37 kg/m as calculated from the following:   Height as of this encounter: 5\' 5"  (1.651 m).   Weight as of this encounter: 107.3 kg. -Weight Loss Counseling given   DVT prophylaxis: SCDs Code Status: FULL CODE Family Communication: No family present at bedside Disposition Plan: Continue Diuresis and workup Jacob Anemia  Consultants:   Cardiology  Gastroenterology    Procedures:  EGD in AM   ECHOCARDIOGRAM  Antimicrobials: Anti-infectives (From admission, onward)   None     Subjective: Seen and examined at bedside and states she is feeling a little bit better but still short of breath is still complain of chest pain.  No nausea or vomiting.  States his legs are still very swollen.  Sitting in the chair at bedside and is not wearing supplemental  oxygen  Objective: Vitals:   03/28/18 1022 03/28/18 1037 03/28/18 1120 03/28/18 1250  BP:  (!) 93/56 112/66 (!) 100/59  Pulse: (!) 108 98 (!) 107 92  Resp:  20  (!) 22  Temp: 98.5 F (36.9 C) 98.3 F (36.8 C) (!) 97.4 F (36.3 C) 98.1 F (36.7 C)  TempSrc:  Oral Oral Oral  SpO2:  99% 97% 95%  Weight:      Height:        Intake/Output Summary (Last 24 hours) at 03/28/2018 1528 Last data filed at 03/28/2018 1250 Gross per 24 hour  Intake 2384.42 ml  Output 855 ml  Net 1529.42 ml   Filed Weights   03/27/18 1118 03/27/18 1715  Weight: 104.8 kg 107.3 kg   Examination: Physical Exam:  Constitutional: WN/WD obese Male in NAD and appears not as anxious today and looks slightly more comfortable Eyes:  Lids and conjunctivae normal, sclerae anicteric  ENMT: External Ears, Nose appear normal. Grossly normal hearing. Mucous membranes are moist. Poor Dentition   Neck: Appears normal, supple, no cervical masses, normal ROM, no appreciable thyromegaly; mild JVD Respiratory: Diminished to auscultation bilaterally, no wheezing, rales, rhonchi or crackles. Normal respiratory effort and patient is not tachypenic. No accessory muscle use.  Cardiovascular: Irregularly Irregular and Tachycardic, no murmurs / rubs / gallops. S1 and S2 auscultated. 2+ LE extremity edema.   Abdomen: Soft, non-tender, Distended 2/2 body habitus. No masses palpated. No appreciable hepatosplenomegaly. Bowel sounds positive.  GU: Deferred. Musculoskeletal: No clubbing / cyanosis of digits/nails. No joint deformity upper and lower extremities.  Skin: No rashes, lesions, ulcers on a limited skin evaluation. No induration; Warm and dry.  Neurologic: CN 2-12 grossly intact with no focal deficits. Romberg sign and cerebellar reflexes not assessed.  Psychiatric: Normal judgment and insight. Alert and oriented x 3. Normal mood and appropriate affect.   Data Reviewed: I have personally reviewed following labs and imaging  studies  CBC: Recent Labs  Lab 03/27/18 1124 03/28/18 0546  WBC 6.4 7.8  NEUTROABS 5.4 5.9  HGB 5.3* 6.9*  HCT 21.3* 24.3*  MCV 66.6* 68.1*  PLT 244 254   Basic Metabolic Panel: Recent Labs  Lab 03/27/18 1124 03/27/18 1808 03/28/18 0546  NA 130*  --  128*  K 3.5  --  4.0  CL 96*  --  94*  CO2 25  --  25  GLUCOSE 168*  --  190*  BUN 37*  --  41*  CREATININE 1.68*  --  1.73*  CALCIUM 8.3*  --  8.1*  MG  --  2.0 2.0  PHOS  --   --  3.1   GFR: Estimated Creatinine Clearance: 50.6 mL/min (A) (by C-G formula based on SCr of 1.73 mg/dL (H)). Liver Function Tests: Recent Labs  Lab 03/28/18 0546  AST 24  ALT 15  ALKPHOS 195*  BILITOT 1.0  PROT 7.3  ALBUMIN 2.8*   No results Jacob input(s): LIPASE, AMYLASE in the last 168 hours. No results Jacob input(s): AMMONIA in the last 168 hours. Coagulation Profile: No results Jacob input(s): INR, PROTIME in the last 168 hours.  Cardiac Enzymes: Recent Labs  Lab 03/27/18 1808 03/27/18 2342 03/28/18 0546  TROPONINI 0.04* 0.04* 0.04*   BNP (last 3 results) No results Jacob input(s): PROBNP in the last 8760 hours. HbA1C: No results Jacob input(s): HGBA1C in the last 72 hours. CBG: Recent Labs  Lab 03/27/18 1726 03/27/18 2048 03/28/18 0745 03/28/18 1115  GLUCAP 63* 270* 156* 217*   Lipid Profile: No results Jacob input(s): CHOL, HDL, LDLCALC, TRIG, CHOLHDL, LDLDIRECT in the last 72 hours. Thyroid Function Tests: Recent Labs    03/27/18 1900  TSH 4.191   Anemia Panel: Recent Labs    03/27/18 1253  VITAMINB12 861  FOLATE 15.9  FERRITIN 8*  TIBC 395  IRON 19*  RETICCTPCT 2.3   Sepsis Labs: No results Jacob input(s): PROCALCITON, LATICACIDVEN in the last 168 hours.  No results found Jacob this or any previous visit (from the past 240 hour(s)).   Radiology Studies: Dg Chest 2 View  Result Date: 03/28/2018 CLINICAL DATA:  Shortness of breath and left-sided chest pain 2 days. EXAM: CHEST - 2 VIEW COMPARISON:  03/27/2018  FINDINGS: Sternotomy wires and left-sided pacemaker unchanged. Lungs are hypoinflated and demonstrate a stable small to moderate right pleural effusion likely with associated basilar atelectasis. Infection in the right base is possible. There is mild prominence of the perihilar markings suggesting mild degree of vascular congestion. Mild stable cardiomegaly. Remainder of the exam is unchanged. IMPRESSION: Mild stable cardiomegaly with minimal vascular congestion and stable small to moderate right pleural effusion likely with associated basilar atelectasis. Infection in the right base is possible. Electronically Signed   By: Elberta Fortis M.D.   On: 03/28/2018 07:35   Dg Chest 2 View  Result Date: 03/27/2018 CLINICAL DATA:  Chest pain EXAM: CHEST - 2 VIEW COMPARISON:  09/12/2017 FINDINGS: Cardiac shadow remains enlarged. Defibrillator is again noted and stable as are postoperative changes. Increasing right-sided pleural effusion with likely underlying atelectasis is present. Mild central vascular congestion is noted as well. IMPRESSION: Mild central vascular congestion. Increasing right basilar effusion and likely underlying atelectasis. Electronically Signed   By: Alcide Clever M.D.   On: 03/27/2018 12:02   Scheduled Meds: . sodium chloride   Intravenous Once  . atorvastatin  40 mg Oral q1800  . digoxin  62.5 mcg Oral Daily  . furosemide  40 mg Intravenous Daily  . insulin aspart  0-15 Units Subcutaneous TID WC  . insulin aspart  0-5 Units Subcutaneous QHS  . insulin glargine  30 Units Subcutaneous Q2200  . ipratropium  0.5 mg Nebulization BID  . isosorbide mononitrate  30 mg Oral Daily  . latanoprost  1 drop Both Eyes Daily  . levalbuterol  0.63 mg Nebulization BID  . sodium chloride flush  3 mL Intravenous Q12H  . spironolactone  12.5 mg Oral Daily   Continuous Infusions: . sodium chloride      LOS: 1 day   Merlene Laughter, DO [facility] PAGER is on AMION  If 7PM-7AM, please  contact night-coverage [email or web address] Password [facility] 03/28/2018, 3:28 PM

## 2018-03-28 NOTE — Progress Notes (Signed)
Pt c/o dizziness when blood transfusion of 1 unit RBC completed.  VSS.  Laid pt in bed.  Will continue to monitor.  Hinton Dyer, RN

## 2018-03-28 NOTE — Plan of Care (Signed)
  Problem: Education: Goal: Knowledge of General Education information will improve Description Including pain rating scale, medication(s)/side effects and non-pharmacologic comfort measures Outcome: Progressing   Problem: Health Behavior/Discharge Planning: Goal: Ability to manage health-related needs will improve Outcome: Progressing   

## 2018-03-29 ENCOUNTER — Encounter (HOSPITAL_COMMUNITY)
Admission: EM | Discharge status: Against Medical Advice | Payer: Self-pay | Source: Home / Self Care | Attending: Internal Medicine

## 2018-03-29 ENCOUNTER — Other Ambulatory Visit (HOSPITAL_COMMUNITY): Payer: Medicare Other

## 2018-03-29 ENCOUNTER — Inpatient Hospital Stay (HOSPITAL_COMMUNITY): Payer: Medicare Other | Admitting: Certified Registered Nurse Anesthetist

## 2018-03-29 ENCOUNTER — Encounter (HOSPITAL_COMMUNITY): Payer: Self-pay | Admitting: Anesthesiology

## 2018-03-29 DIAGNOSIS — K253 Acute gastric ulcer without hemorrhage or perforation: Secondary | ICD-10-CM

## 2018-03-29 DIAGNOSIS — I4821 Permanent atrial fibrillation: Secondary | ICD-10-CM

## 2018-03-29 DIAGNOSIS — I5020 Unspecified systolic (congestive) heart failure: Secondary | ICD-10-CM

## 2018-03-29 DIAGNOSIS — Z95810 Presence of automatic (implantable) cardiac defibrillator: Secondary | ICD-10-CM

## 2018-03-29 DIAGNOSIS — I70211 Atherosclerosis of native arteries of extremities with intermittent claudication, right leg: Secondary | ICD-10-CM

## 2018-03-29 DIAGNOSIS — Z7901 Long term (current) use of anticoagulants: Secondary | ICD-10-CM

## 2018-03-29 HISTORY — PX: ESOPHAGOGASTRODUODENOSCOPY (EGD) WITH PROPOFOL: SHX5813

## 2018-03-29 HISTORY — PX: BIOPSY: SHX5522

## 2018-03-29 LAB — COMPREHENSIVE METABOLIC PANEL
ALK PHOS: 187 U/L — AB (ref 38–126)
ALT: 18 U/L (ref 0–44)
AST: 24 U/L (ref 15–41)
Albumin: 2.8 g/dL — ABNORMAL LOW (ref 3.5–5.0)
Anion gap: 9 (ref 5–15)
BILIRUBIN TOTAL: 1.5 mg/dL — AB (ref 0.3–1.2)
BUN: 46 mg/dL — ABNORMAL HIGH (ref 8–23)
CO2: 28 mmol/L (ref 22–32)
Calcium: 8.5 mg/dL — ABNORMAL LOW (ref 8.9–10.3)
Chloride: 93 mmol/L — ABNORMAL LOW (ref 98–111)
Creatinine, Ser: 1.78 mg/dL — ABNORMAL HIGH (ref 0.61–1.24)
GFR calc Af Amer: 47 mL/min — ABNORMAL LOW (ref >60)
GFR calc non Af Amer: 40 mL/min — ABNORMAL LOW (ref >60)
GLUCOSE: 112 mg/dL — AB (ref 70–99)
Potassium: 4.1 mmol/L (ref 3.5–5.1)
Sodium: 130 mmol/L — ABNORMAL LOW (ref 135–145)
TOTAL PROTEIN: 7.7 g/dL (ref 6.5–8.1)

## 2018-03-29 LAB — CBC WITH DIFFERENTIAL/PLATELET
Abs Immature Granulocytes: 0.04 10*3/uL (ref 0.00–0.07)
Basophils Absolute: 0.1 10*3/uL (ref 0.0–0.1)
Basophils Relative: 1 %
Eosinophils Absolute: 0.1 10*3/uL (ref 0.0–0.5)
Eosinophils Relative: 1 %
HEMATOCRIT: 26.8 % — AB (ref 39.0–52.0)
Hemoglobin: 7.3 g/dL — ABNORMAL LOW (ref 13.0–17.0)
Immature Granulocytes: 1 %
Lymphocytes Relative: 14 %
Lymphs Abs: 1 10*3/uL (ref 0.7–4.0)
MCH: 18.9 pg — ABNORMAL LOW (ref 26.0–34.0)
MCHC: 27.2 g/dL — ABNORMAL LOW (ref 30.0–36.0)
MCV: 69.3 fL — ABNORMAL LOW (ref 80.0–100.0)
Monocytes Absolute: 0.6 10*3/uL (ref 0.1–1.0)
Monocytes Relative: 8 %
NEUTROS ABS: 5.7 10*3/uL (ref 1.7–7.7)
NEUTROS PCT: 75 %
Platelets: 256 10*3/uL (ref 150–400)
RBC: 3.87 MIL/uL — ABNORMAL LOW (ref 4.22–5.81)
RDW: 26.5 % — ABNORMAL HIGH (ref 11.5–15.5)
WBC: 7.6 10*3/uL (ref 4.0–10.5)
nRBC: 0.5 % — ABNORMAL HIGH (ref 0.0–0.2)

## 2018-03-29 LAB — GLUCOSE, CAPILLARY
Glucose-Capillary: 110 mg/dL — ABNORMAL HIGH (ref 70–99)
Glucose-Capillary: 102 mg/dL — ABNORMAL HIGH (ref 70–99)
Glucose-Capillary: 198 mg/dL — ABNORMAL HIGH (ref 70–99)
Glucose-Capillary: 94 mg/dL (ref 70–99)

## 2018-03-29 LAB — PREPARE RBC (CROSSMATCH)

## 2018-03-29 LAB — MAGNESIUM: Magnesium: 2 mg/dL (ref 1.7–2.4)

## 2018-03-29 SURGERY — ESOPHAGOGASTRODUODENOSCOPY (EGD) WITH PROPOFOL
Anesthesia: Monitor Anesthesia Care

## 2018-03-29 MED ORDER — SODIUM CHLORIDE 0.9% IV SOLUTION
Freq: Once | INTRAVENOUS | Status: AC
  Administered 2018-03-29: 13:00:00 via INTRAVENOUS

## 2018-03-29 MED ORDER — LACTATED RINGERS IV SOLN
INTRAVENOUS | Status: DC | PRN
  Administered 2018-03-29: 10:00:00 via INTRAVENOUS

## 2018-03-29 MED ORDER — PROPOFOL 500 MG/50ML IV EMUL
INTRAVENOUS | Status: DC | PRN
  Administered 2018-03-29: 80 ug/kg/min via INTRAVENOUS

## 2018-03-29 MED ORDER — SODIUM CHLORIDE 0.9 % IV SOLN: INTRAVENOUS | Status: DC

## 2018-03-29 MED ORDER — PEG 3350-KCL-NA BICARB-NACL 420 G PO SOLR
4000.0000 mL | Freq: Once | ORAL | Status: AC
  Administered 2018-03-29: 4000 mL via ORAL
  Filled 2018-03-29 (×2): qty 4000

## 2018-03-29 MED ORDER — PHENYLEPHRINE 40 MCG/ML (10ML) SYRINGE FOR IV PUSH (FOR BLOOD PRESSURE SUPPORT)
PREFILLED_SYRINGE | INTRAVENOUS | Status: DC | PRN
  Administered 2018-03-29 (×4): 80 ug via INTRAVENOUS

## 2018-03-29 MED ORDER — PANTOPRAZOLE SODIUM 40 MG PO TBEC
40.0000 mg | DELAYED_RELEASE_TABLET | Freq: Every day | ORAL | Status: DC
  Administered 2018-03-29 – 2018-03-30 (×2): 40 mg via ORAL
  Filled 2018-03-29 (×2): qty 1

## 2018-03-29 SURGICAL SUPPLY — 15 items

## 2018-03-29 NOTE — Progress Notes (Addendum)
OT Cancellation Note  Patient Details Name: Jacob [name] MRN: [id number] DOB: [birth year]   Cancelled Treatment:    Reason Eval/Treat Not Completed: Patient at procedure or test/ unavailable(Endoscopy. Will return as schedule allows.)   Second attempt @ 15:10: Pt receiving blood and about to have another procedure per RN. Will hold and return as schedule allows. Thank you.  Zane Samson M Valentine Barney Mahagony Grieb MSOT, OTR/L Acute Rehab Pager: [phone] Office: [phone] 03/29/2018, 10:09 AM

## 2018-03-29 NOTE — Progress Notes (Signed)
PROGRESS NOTE    Jacob PunchesYahia [name]  ZOX:[id number] DOB: January 19, 1956 DOA: 03/27/2018 PCP: [name], Enobong, MD   Brief Narrative:   Jacob [name] is a 63 y.o. male with medical history significant of full medical comorbidities including atrial fibrillation,, history of AICD placement, systolic CHF, chronic anticoagulation with Xarelto due to atrial fibrillation, hyperlipidemia, hypertension, history of MI, type 2 diabetes mellitus on insulin, history of noncompliance, hypertension, history of GI bleed and gastric hemorrhage and duodenal ulceration, as well as other comorbidities who presents with a chief complaint of shortness of breath, chest pain and chest fluttering.  Patient is nondescript on his symptoms and states that his shortness of breath is progressively gotten worse but in the last 2 days significantly worsened.  He states last night he started having some chest pain and fluttering of his heart.  Also states that he had some sharp abdominal pain which is resolved.  He got concerned because of his chest pain and shortness of breath.  He states that he woke up from sleep with heaviness shortness of breath and intermittent chest pain.  He states the chest pain has been intermittent and severe with no specific alleviating or aggravating factors.  He took home aspirin.  He has not noticed any blood in his stool or his urine has not been vomiting or been nauseous.  He states that he had symptoms a few weeks ago and his PCP approximately a week ago.  Went to the CHF clinic on 02/26/2018  ED Course: In the ED had basic blood work done and was typed and screened and started transfusion of 2 units of pRBCs. Fecal occult was negative and chest x-ray did show some vascular congestion.  EKG was also done.  EP to contact cardiology about this patient  **GI also evaluated given his symptomatic anemia and patient underwent EGD which showed non-bleeding gastric ulcers has a single nonbleeding questionable small  angiodysplastic lesion in the duodenum as well as a nonbleeding small duodenal ulcer.  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  Dr Ewing SchleinMagod took biopsies and tested for H. pylori. Colonoscopy set up for tomorrow. Patient is still being diuresed cardiology is following we will continue to monitor blood count and transfuse an additional 1 unit today.  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 [facility]   Microcytic anemia   AF (atrial fibrillation) (HCC)   ICD - in place- BS May 2014 [place]   Tobacco abuse   PVD (peripheral vascular disease) (HCC)   DM (diabetes mellitus), type 2 with peripheral vascular complications (HCC)   Atrial fibrillation with controlled ventricular response (HCC)   Atherosclerosis of native arteries of extremity with intermittent claudication (HCC)   SOB (shortness of breath)   Chronic anticoagulation   Cardiomyopathy, ischemic   CHF (congestive heart failure) (HCC)   Congestive heart disease (HCC)   Chronic systolic heart failure (HCC)  Symptomatic Anemia -Admit to stepdown unit cardiac  -Patient's Hemoglobin/hematocrit on admission was 5.3/21.4 -Typed and screened and transfused 3 units PRBCs but Hb/hxt this AM was 7.3/26.8 so will transfuse an additional 1 unit -Prior to transfusion given IV Lasix 20 mg and in between units give another 20 mg -Continue diuresis with BID IV Lasix and defer to Cardiology for additional recommendations  Continue monitor for signs and symptoms  bleeding -Patient's FOBT was negative -Continue with Xopenex for breathing treatments as well as ipratropium scheduled -Anemia panel done and showed an iron level of 19, U IBC of 376, TIBC of 395, saturation of 5%, ferritin level of 8, folate level 15.9, and vitamin B12 level of 861 -Has a history of a  duodenal ulcer strict hemorrhage -Continue monitor hemoglobin very carefully -Repeat CBC in the a.m. -Gastroenterology Consulted for further evaluation and took the patient for EGD which showed the larynx was normal and esophagus was normal but there is few nonbleeding small cratered and superficial gastric ulcers which were biopsied to rule out H. pylori and there is also small probable angiodysplastic lesion with out bleeding found in the second portion of the duodenum as well as a nonbleeding small superficial duodenal ulcer found in the duodenal bulb.  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 daily indefinitely and recommending clear liquid diet today and warning about aspirin and other nonsteroidals in the future if he is particularly on blood thinners -If colonoscopy is okay GI recommends placing patient back on his home Xarelto for now and monitor for signs and symptoms of bleeding   Suspected acute systolic CHF with last EF of 20 to 25% -In the setting of his symptomatic anemia -Has AICD in place -CXR showed "Mild Central Vascular congestion and Increasing right basilar effusion and likely underlying atelectasis." -Start diuresis with IV Lasix.  Patient states that his torsemide is not longer working -Takes torsemide and metolazone in the outpatient setting -We will defer to cardiology to restart these but for now we will continue IV Lasix and will increase to BID continue now the patient is getting an additional unit of blood -Repeat echocardiogram patient was atrial fibrillation he had a mildly dilated left ventricle with EF of 25 to 30% with diffuse hypokinesis.  There is a normal RV size with moderately decreased systolic function  and there is no significant valvular abnormalities.  There is a dilated IVC suggestive of elevated RV filling pressure -Continue spironolactone for now  -We will resume Imdur but currently hold hydralazine given his lower blood pressures but will need hydralazine added back when safe -Consulted cardiology for further evaluation and recommendations; -Strict I's and O's and daily weights -Patient is +1.400 Liters since Admission and Weight is Up 7 lbs -BNP was elevated 499 on admission -Continue monitor volume status carefully -Monitor daily CMP's and watch potassium as he is getting spironolactone  Atrial fibrillation -Currently not in RVR heart rates are currently ranging from high 90s to low low 100s currently -Has refused cardioversion in the past -Continue with cardiac monitoring -Holding Xarelto given his acute anemia but per GI may be able to resume if colonoscopy is okay -Continue with home digoxin and get digoxin level; Jackson level 0.4 -We will need further cardiac evaluation and appreciate Cardiology following   History of GI bleeding with duodenal ulceration and clipping in 2005 -Denies any melena, hematochezia, coffee-ground emesis or hematemesis -Holding Xarelto given Hemglobin of 5.3 -S/p Transfusion of 4 units of pRBC's -Underwent EGD this a.m. and will go undergo colonoscopy tomorrow  PAD -Had a last ABI done in 2015 -Follows with Dr. Allyson SabalBerry -C/w Statin -Continues to smoke and does not plan to quit  Chest pain rule  out ACS -Placed on cardiac telemetry and cycle cardiac troponins; chest pain has now resolved -Troponins have been Flat at 0.04 -Check echocardiogram -Have cardiology evaluate for further evaluation recommendations -Checked TSH and was 4.191 -UDS Negative -Likely with chest pain may be in the setting of his anemia -Continue to Monitor  Insulin-dependent diabetes mellitus -Hold his home glipizide -As hemoglobin A1c at his PCP office  6.5 -Continue with home regimen but reduce dosing to 30 units and place on NovoLog/scale insulin -CBG's ranging from 75-110 -Rpeat hemoglobin A1c in a.m.  Acute kidney injury in the setting of CKD stage III -In the setting of anemia and mild volume overload -BUN/Cr worsened and went from 37/1.68 -> 41/1.73 -> 46/1.78 -Continue diuresis as above -Transfuse 4 units of PRBCs -Spironolactone -Avoid other nephrotoxic medications possible -To monitor and trend renal function -Avoid IV fluid given suspected CHF -Continue to Monitor closely as has a Hx of Cardiorenal -May need Nephrology involvement if does not improve significantly  Hyperlipidemia -Continue with Atorvastatin 40 mg p.o. daily  CAD with history of occlusion of a small RCA with collaterals and history of CABG with MAZE -Continue with isosorbide mononitrate and atorvastatin  Glaucoma -Continue with Latanoprost  Hyponatremia -Likely in the setting of suspected hypervolemic hyponatremia -Na+ went from 130 -> 128 -> 130 -Continue monitor and trend sodium with diuresis -Repeat CMP in a.m. -May Need Nephrology involvement   Gout -Currently holding up Allopurinol will resume when kidney function function is improved  Hypertension -Patient blood pressure was on the lower side -We will currently hold hydralazine 25 mg p.o. nightly but will continue isosorbide mononitrate -Holding home diuretics with metolazone and torsemide  Tobacco Abuse -Smoking cessation counseling given  Obesity -Estimated body mass index is 39.69 kg/m as calculated from the following:   Height as of this encounter: 5\' 5"  (1.651 m).   Weight as of this encounter: 108.2 kg. -Weight Loss Counseling given   Hyperbilirubinemia -Likely reactive -Patient's T bili was 1.5 -Continue monitor and trend and repeat CMP in a.m.  DVT prophylaxis: SCDs Code Status: FULL CODE Family Communication: No family present at bedside Disposition Plan:  Continue Diuresis and workup for Anemia  Consultants:   Cardiology  Gastroenterology    Procedures:  EGD  Findings:      The larynx was normal.      The examined esophagus was normal.      Few non-bleeding small cratered and superficial gastric ulcers with       pigmented material were found on the greater curvature of the stomach,       on the lesser curvature of the stomach and in the gastric antrum.       Biopsies were taken with a cold forceps for histology of a few of the       ulcers and the antrum and the fundus to rule out Helicobacter.      A single small probable angiodysplastic lesion without bleeding was       found in the second portion of the duodenum.      The third portion of the duodenum was normal.      The exam was otherwise without abnormality.      One non-bleeding small superficial duodenal ulcer was found in the       duodenal bulb.      Diffuse mild inflammation characterized by congestion (edema) was found       in the entire examined stomach compatible with probable portal  gastropathy from right heart failure. Impression:               - Normal larynx.                           - Normal esophagus.                           - Non-bleeding small gastric ulcers with pigmented                            material. Biopsied.                           - A single non-bleeding ? Small angiodysplastic                            lesion in the duodenum.                           - Normal third portion of the duodenum.                           - The examination was otherwise normal.                           - One non-bleeding small duodenal ulcer.                           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  ECHOCARDIOGRAM ------------------------------------------------------------------- Study Conclusions  - Left ventricle: The cavity size was mildly dilated. Wall   thickness was  normal. Systolic function was severely reduced. The   estimated ejection fraction was in the range of 25% to 30%.   Diffuse hypokinesis. The study was not technically sufficient to   allow evaluation of LV diastolic dysfunction due to atrial   fibrillation. - Aortic valve: Trileaflet; moderately calcified leaflets. There   was no stenosis. There was trivial regurgitation. - Mitral valve: Moderately calcified annulus. There was no   significant regurgitation. - Left atrium: The atrium was moderately dilated. - Right ventricle: The cavity size was normal. Pacer wire or   catheter noted in right ventricle. Systolic function was   moderately reduced. - Right atrium: The atrium was mildly dilated. - Pulmonary arteries: No complete TR doppler jet so unable to   estimate PA systolic pressure. - Systemic veins: IVC measured 2.7 cm with < 50% respirophasic   variation, suggesting RA pressure 15 mmHg.  Impressions:  - The patient was in atrial fibrillation. Mildly dilated LV with EF   25-30%, diffuse hypokinesis. Normal RV size with moderately   decreased systolic function. No significant valvular   abnormalities. Dilated IVC suggestive of elevated RV filling   pressure.  COLONOSCOPY    Antimicrobials: Anti-infectives (From admission, onward)   None     Subjective: Seen and examined at bedside after his EGD and states that his shortness of breath is improved about 60% and that his chest pain is  completely resolved.  Still complains of lower extremity swelling.  No nausea or vomiting.  Was wanting to eat and states he is very hungry.  No other concerns or complaints at this time  Objective: Vitals:   03/29/18 0747 03/29/18 0942 03/29/18 1020 03/29/18 1031  BP:  (!) 123/59 (!) 105/53 (!) 105/59  Pulse:  (!) 101 96 94  Resp:  (!) 21 18 15   Temp:  97.7 F (36.5 C) 97.7 F (36.5 C)   TempSrc:  Oral Oral   SpO2: 97% 98% 98% 93%  Weight:      Height:        Intake/Output  Summary (Last 24 hours) at 03/29/2018 1059 Last data filed at 03/29/2018 1011 Gross per 24 hour  Intake 1411.17 ml  Output 975 ml  Net 436.17 ml   Filed Weights   03/27/18 1118 03/27/18 1715 03/29/18 0446  Weight: 104.8 kg 107.3 kg 108.2 kg   Examination: Physical Exam:  Constitutional: Well-nourished, well-developed obese Caucasian male currently no acute distress appears calm and comfortable sitting in chair bedside Eyes: Sclera anicteric.  Lids and conjunctive are normal. ENMT: External ears and nose appear normal.  Grossly normal hearing.  Mucous members are moist.  Poor dentition Neck: Supple with no JVD Respiratory: Diminished to auscultation bilateral with no appreciable wheezing, rales, rhonchi.  Has a normal respiratory effort and is not tachypneic and not using any accessory muscles are use Cardiovascular: Irregularly irregular and mildly tachycardic.  No appreciable murmurs, rubs, gallops noted.  Has 2-3+ lower extremity edema Abdomen: Soft, nontender, distended secondary body habitus.  Bowel sounds present in 4 quadrants GU: Deferred Musculoskeletal: No contractures or cyanosis.  No joint deformities noted Skin: No appreciable rashes or lesions limited skin evaluation.  Skin is warm dry Neurologic: Cranial nerves II through XII grossly intact no appreciable focal deficits.  Romberg sign and cerebellar reflexes were not assessed Psychiatric: Normal judgment and insight.  Patient is awake and alert and oriented x3.  Normal mood and affect  Data Reviewed: I have personally reviewed following labs and imaging studies  CBC: Recent Labs  Lab 03/27/18 1124 03/28/18 0546 03/29/18 0920  WBC 6.4 7.8 7.6  NEUTROABS 5.4 5.9 5.7  HGB 5.3* 6.9* 7.3*  HCT 21.3* 24.3* 26.8*  MCV 66.6* 68.1* 69.3*  PLT 244 254 256   Basic Metabolic Panel: Recent Labs  Lab 03/27/18 1124 03/27/18 1808 03/28/18 0546 03/29/18 0920  NA 130*  --  128* 130*  K 3.5  --  4.0 4.1  CL 96*  --  94* 93*    CO2 25  --  25 28  GLUCOSE 168*  --  190* 112*  BUN 37*  --  41* 46*  CREATININE 1.68*  --  1.73* 1.78*  CALCIUM 8.3*  --  8.1* 8.5*  MG  --  2.0 2.0 2.0  PHOS  --   --  3.1  --    GFR: Estimated Creatinine Clearance: 49.4 mL/min (A) (by C-G formula based on SCr of 1.78 mg/dL (H)). Liver Function Tests: Recent Labs  Lab 03/28/18 0546 03/29/18 0920  AST 24 24  ALT 15 18  ALKPHOS 195* 187*  BILITOT 1.0 1.5*  PROT 7.3 7.7  ALBUMIN 2.8* 2.8*   No results for input(s): LIPASE, AMYLASE in the last 168 hours. No results for input(s): AMMONIA in the last 168 hours. Coagulation Profile: No results for input(s): INR, PROTIME in the last 168 hours. Cardiac Enzymes: Recent Labs  Lab 03/27/18 1808  03/27/18 2342 03/28/18 0546  TROPONINI 0.04* 0.04* 0.04*   BNP (last 3 results) No results for input(s): PROBNP in the last 8760 hours. HbA1C: No results for input(s): HGBA1C in the last 72 hours. CBG: Recent Labs  Lab 03/28/18 0745 03/28/18 1115 03/28/18 1621 03/28/18 2037 03/29/18 0727  GLUCAP 156* 217* 159* 75 110*   Lipid Profile: No results for input(s): CHOL, HDL, LDLCALC, TRIG, CHOLHDL, LDLDIRECT in the last 72 hours. Thyroid Function Tests: Recent Labs    03/27/18 1900  TSH 4.191   Anemia Panel: Recent Labs    03/27/18 1253  VITAMINB12 861  FOLATE 15.9  FERRITIN 8*  TIBC 395  IRON 19*  RETICCTPCT 2.3   Sepsis Labs: No results for input(s): PROCALCITON, LATICACIDVEN in the last 168 hours.  No results found for this or any previous visit (from the past 240 hour(s)).   Radiology Studies: Dg Chest 2 View  Result Date: 03/28/2018 CLINICAL DATA:  Shortness of breath and left-sided chest pain 2 days. EXAM: CHEST - 2 VIEW COMPARISON:  03/27/2018 FINDINGS: Sternotomy wires and left-sided pacemaker unchanged. Lungs are hypoinflated and demonstrate a stable small to moderate right pleural effusion likely with associated basilar atelectasis. Infection in the right  base is possible. There is mild prominence of the perihilar markings suggesting mild degree of vascular congestion. Mild stable cardiomegaly. Remainder of the exam is unchanged. IMPRESSION: Mild stable cardiomegaly with minimal vascular congestion and stable small to moderate right pleural effusion likely with associated basilar atelectasis. Infection in the right base is possible. Electronically Signed   By: Elberta Fortis M.D.   On: 03/28/2018 07:35   Dg Chest 2 View  Result Date: 03/27/2018 CLINICAL DATA:  Chest pain EXAM: CHEST - 2 VIEW COMPARISON:  09/12/2017 FINDINGS: Cardiac shadow remains enlarged. Defibrillator is again noted and stable as are postoperative changes. Increasing right-sided pleural effusion with likely underlying atelectasis is present. Mild central vascular congestion is noted as well. IMPRESSION: Mild central vascular congestion. Increasing right basilar effusion and likely underlying atelectasis. Electronically Signed   By: Alcide Clever M.D.   On: 03/27/2018 12:02   Scheduled Meds: . [MAR Hold] sodium chloride   Intravenous Once  . sodium chloride   Intravenous Once  . [MAR Hold] atorvastatin  40 mg Oral q1800  . [MAR Hold] digoxin  62.5 mcg Oral Daily  . [MAR Hold] furosemide  40 mg Intravenous BID  . [MAR Hold] insulin aspart  0-15 Units Subcutaneous TID WC  . [MAR Hold] insulin aspart  0-5 Units Subcutaneous QHS  . [MAR Hold] insulin glargine  30 Units Subcutaneous Q2200  . [MAR Hold] ipratropium  0.5 mg Nebulization BID  . [MAR Hold] isosorbide mononitrate  30 mg Oral Daily  . [MAR Hold] latanoprost  1 drop Both Eyes Daily  . [MAR Hold] levalbuterol  0.63 mg Nebulization BID  . pantoprazole  40 mg Oral Daily  . [MAR Hold] sodium chloride flush  3 mL Intravenous Q12H  . [MAR Hold] spironolactone  12.5 mg Oral Daily   Continuous Infusions: . [MAR Hold] sodium chloride    . sodium chloride 20 mL/hr at 03/29/18 0400    LOS: 2 days   Merlene Laughter, DO [facility] PAGER is on AMION  If 7PM-7AM, please contact night-coverage [email or web address] Password [email or web address] 03/29/2018, 10:59 AM

## 2018-03-29 NOTE — Anesthesia Postprocedure Evaluation (Signed)
Anesthesia Post Note  Patient: Jacob [name]  Procedure(s) Performed: ESOPHAGOGASTRODUODENOSCOPY (EGD) WITH PROPOFOL (N/A ) BIOPSY     Patient location during evaluation: PACU Anesthesia Type: MAC Level of consciousness: awake and alert Pain management: pain level controlled Vital Signs Assessment: post-procedure vital signs reviewed and stable Respiratory status: spontaneous breathing, nonlabored ventilation, respiratory function stable and patient connected to nasal cannula oxygen Cardiovascular status: stable and blood pressure returned to baseline Postop Assessment: no apparent nausea or vomiting Anesthetic complications: no    Last Vitals:  Vitals:   03/29/18 1020 03/29/18 1031  BP: (!) 105/53 (!) 105/59  Pulse: 96 94  Resp: 18 15  Temp: 36.5 C   SpO2: 98% 93%    Last Pain:  Vitals:   03/29/18 1031  TempSrc:   PainSc: 0-No pain                 Effie Berkshire

## 2018-03-29 NOTE — Progress Notes (Addendum)
Jacob [name] 9:44 AM  Subjective: Patient doing well without any GI complaints and we rediscussed his procedure  Objective: Vital signs stable afebrile no acute distress exam please see preassessment evaluation today's labs pending Assessment: We will medical problems including iron deficiency anemia on blood thinners and history of ulcers  Plan: Okay to proceed with endoscopy with anesthesia assistance today with further work-up and plans pending those findings  [facility]  Pager [phone] After 5PM or if no answer call [phone]

## 2018-03-29 NOTE — Plan of Care (Signed)
  Problem: Education: Goal: Knowledge of General Education information will improve Description Including pain rating scale, medication(s)/side effects and non-pharmacologic comfort measures 03/29/2018 2009 by Allison Quarry, RN Outcome: Progressing 03/29/2018 2008 by Allison Quarry, RN Outcome: Progressing

## 2018-03-29 NOTE — Progress Notes (Signed)
Progress Note  Patient Name: Jacob [name] Date of Encounter: 03/29/2018  Primary Cardiologist: Arvilla Meres, MD   Subjective   His breathing is comfortable.  No chest pain.  No melena.  He states that swelling is bad in his legs.  Inpatient Medications    Scheduled Meds: . sodium chloride   Intravenous Once  . atorvastatin  40 mg Oral q1800  . digoxin  62.5 mcg Oral Daily  . furosemide  40 mg Intravenous BID  . insulin aspart  0-15 Units Subcutaneous TID WC  . insulin aspart  0-5 Units Subcutaneous QHS  . insulin glargine  30 Units Subcutaneous Q2200  . ipratropium  0.5 mg Nebulization BID  . isosorbide mononitrate  30 mg Oral Daily  . latanoprost  1 drop Both Eyes Daily  . levalbuterol  0.63 mg Nebulization BID  . sodium chloride flush  3 mL Intravenous Q12H  . spironolactone  12.5 mg Oral Daily   Continuous Infusions: . sodium chloride    . sodium chloride 20 mL/hr at 03/29/18 0400   PRN Meds: sodium chloride, acetaminophen, ALPRAZolam, meclizine, ondansetron (ZOFRAN) IV, sodium chloride flush, traZODone   Vital Signs    Vitals:   03/29/18 0045 03/29/18 0446 03/29/18 0731 03/29/18 0747  BP: 115/86  116/81   Pulse: (!) 106  99   Resp:      Temp: 98 F (36.7 C)  (!) 97.5 F (36.4 C)   TempSrc: Oral  Oral   SpO2: 97%  97% 97%  Weight:  108.2 kg    Height:        Intake/Output Summary (Last 24 hours) at 03/29/2018 0758 Last data filed at 03/29/2018 0400 Gross per 24 hour  Intake 1331.17 ml  Output 1080 ml  Net 251.17 ml   Filed Weights   03/27/18 1118 03/27/18 1715 03/29/18 0446  Weight: 104.8 kg 107.3 kg 108.2 kg    Telemetry    Bi V pacing noted- Personally Reviewed  ECG    No new- Personally Reviewed  Physical Exam   GEN: No acute distress.   Neck: No JVD Cardiac: RRR, no murmurs, rubs, or gallops.  Respiratory: Clear to auscultation bilaterally. GI: Soft, nontender, non-distended  MS:  4+ chronic lower extremity edema; No  deformity. Neuro:  Nonfocal  Psych: Normal affect   Labs    Chemistry Recent Labs  Lab 03/27/18 1124 03/28/18 0546  NA 130* 128*  K 3.5 4.0  CL 96* 94*  CO2 25 25  GLUCOSE 168* 190*  BUN 37* 41*  CREATININE 1.68* 1.73*  CALCIUM 8.3* 8.1*  PROT  --  7.3  ALBUMIN  --  2.8*  AST  --  24  ALT  --  15  ALKPHOS  --  195*  BILITOT  --  1.0  GFRNONAA 43* 42*  GFRAA 50* 48*  ANIONGAP 9 9     Hematology Recent Labs  Lab 03/27/18 1124 03/27/18 1253 03/28/18 0546  WBC 6.4  --  7.8  RBC 3.20* 3.02* 3.57*  HGB 5.3*  --  6.9*  HCT 21.3*  --  24.3*  MCV 66.6*  --  68.1*  MCH 16.6*  --  19.3*  MCHC 24.9*  --  28.4*  RDW 24.0*  --  25.7*  PLT 244  --  254    Cardiac Enzymes Recent Labs  Lab 03/27/18 1808 03/27/18 2342 03/28/18 0546  TROPONINI 0.04* 0.04* 0.04*    Recent Labs  Lab 03/27/18 1133  TROPIPOC 0.02  BNP Recent Labs  Lab 03/27/18 1124  BNP 499.7*     DDimer No results for input(s): DDIMER in the last 168 hours.   Radiology    Dg Chest 2 View  Result Date: 03/28/2018 CLINICAL DATA:  Shortness of breath and left-sided chest pain 2 days. EXAM: CHEST - 2 VIEW COMPARISON:  03/27/2018 FINDINGS: Sternotomy wires and left-sided pacemaker unchanged. Lungs are hypoinflated and demonstrate a stable small to moderate right pleural effusion likely with associated basilar atelectasis. Infection in the right base is possible. There is mild prominence of the perihilar markings suggesting mild degree of vascular congestion. Mild stable cardiomegaly. Remainder of the exam is unchanged. IMPRESSION: Mild stable cardiomegaly with minimal vascular congestion and stable small to moderate right pleural effusion likely with associated basilar atelectasis. Infection in the right base is possible. Electronically Signed   By: Elberta Fortisaniel  Boyle M.D.   On: 03/28/2018 07:35   Dg Chest 2 View  Result Date: 03/27/2018 CLINICAL DATA:  Chest pain EXAM: CHEST - 2 VIEW COMPARISON:   09/12/2017 FINDINGS: Cardiac shadow remains enlarged. Defibrillator is again noted and stable as are postoperative changes. Increasing right-sided pleural effusion with likely underlying atelectasis is present. Mild central vascular congestion is noted as well. IMPRESSION: Mild central vascular congestion. Increasing right basilar effusion and likely underlying atelectasis. Electronically Signed   By: Alcide CleverMark  Lukens M.D.   On: 03/27/2018 12:02    Cardiac Studies   Relevant CV Studies:  [facility]  Ost RCA to Prox RCA lesion is 100% stenosed.  Ost Cx to Prox Cx lesion is 99% stenosed.  Ost LAD to Prox LAD lesion is 40% stenosed.  Prox LAD to Mid LAD lesion is 40% stenosed.  Dist LAD lesion is 70% stenosed.  Findings:  Ao = 126/66 (86)  LV = 118/14 RA = 8 RV = 44/8 PA = 52/17 (34) PCW = 17 Fick cardiac output/index = 4.2/2.0 PVR = 4.0 WU Ao sat = 98% PA sat = 60%, 61%  Echocardiogram 03/28/2018:  - Left ventricle: The cavity size was mildly dilated. Wall   thickness was normal. Systolic function was severely reduced. The   estimated ejection fraction was in the range of 25% to 30%.   Diffuse hypokinesis. The study was not technically sufficient to   allow evaluation of LV diastolic dysfunction due to atrial   fibrillation. - Aortic valve: Trileaflet; moderately calcified leaflets. There   was no stenosis. There was trivial regurgitation. - Mitral valve: Moderately calcified annulus. There was no   significant regurgitation. - Left atrium: The atrium was moderately dilated. - Right ventricle: The cavity size was normal. Pacer wire or   catheter noted in right ventricle. Systolic function was   moderately reduced. - Right atrium: The atrium was mildly dilated. - Pulmonary arteries: No complete TR doppler jet so unable to   estimate PA systolic pressure. - Systemic veins: IVC measured 2.7 cm with < 50% respirophasic   variation, suggesting RA pressure 15  mmHg.  Impressions:  - The patient was in atrial fibrillation. Mildly dilated LV with EF   25-30%, diffuse hypokinesis. Normal RV size with moderately   decreased systolic function. No significant valvular   abnormalities. Dilated IVC suggestive of elevated RV filling   pressure.  Patient Profile     63 y.o. male with a hx of systolic heart failure with EF 20-25% status post ICD 2014, RV dysfunction, CAD s/p CABG x2 w/ maze 2014, diabetes type 2 on insulin, chronic A. fib  on Xarelto, GI bleed 11/2014, hyperlipidemia and noncompliance , here with acute anemia severe requiring 3 units of blood, likely GI source, holding Xarelto.  Assessment & Plan    Acute blood loss anemia - Likely GI source, perhaps slow bleed.  Iron studies quite low.  Has had prior history of GI bleed.  FOBT however was negative.   -Dr. Marlane Hatcher note from gastroenterology reviewed.  "He has multiple medical problems including anemia on blood thinners with history of duodenal ulcer.  Plan is to perform endoscopy and either inpatient or outpatient colonoscopy which he has never had. - 3 units of blood. Hemoglobin was 5.3 on admission. -Obviously continue to hold Xarelto.  Acute on chronic systolic heart failure NYHA IIIb -Shortness of breath was probably mostly related to his severe anemia.   This seems to have improved after blood. He does however have right lower lobe effusion.   For now appears volume neutral, does have chronic lower extremity edema which we can always work on. -He does state that he has been compliant with his medications according to recent December office visit.  But he does feel like the diuretics no longer work. -Continue with IV Lasix for now -Watch potassium with spironolactone.  Continue.  Acute kidney injury - Creatinine 1.68-1.73 likely hypoperfusion severe anemia.  Monitor closely.  1 month ago creatinine was 1.34, 5 months ago 1.44.  Chronic atrial fibrillation -Difficult to control  refused cardioversion no longer on amiodarone due to lung changes on CT - Obviously stopping Xarelto because of presumed blood loss anemia - Heart rate is currently 100-110, continue with digoxin.  Reasonable rate control. - If renal function were to deteriorate further, may need to discontinue digoxin at some point.  Tobacco use -Continue to encourage cessation.  Still smokes about 1/2 pack/day.  Continue to encourage to quit  Hyperlipidemia -Atorvastatin high intensity dose 40 mg.  We will continue to follow with you.  Complex medical decision making     For questions or updates, please contact [facility] Please consult [email or web address] for contact info under        Signed, Donato Schultz, MD  03/29/2018, 7:58 AM

## 2018-03-29 NOTE — Op Note (Signed)
[facility] Patient Name: Jacob [name] Procedure Date : 03/29/2018 MRN: [id number] Attending MD: Vida Rigger , MD Date of Birth: [birth year] CSN: [id number] Age: 62 Admit Type: Inpatient Procedure:                Upper GI endoscopy Indications:              Iron deficiency anemia, Personal history of peptic                            ulcer disease Providers:                Vida Rigger, MD, Norman Clay, RN, Leslie Dales, RN,                            Verita Schneiders, Technician, Dairl Ponder, CRNA Referring MD:              Medicines:                Propofol total dose 120 mg IV Complications:            No immediate complications. Estimated Blood Loss:     Estimated blood loss: none. Estimated blood loss:                            none. Procedure:                Pre-Anesthesia Assessment:                           - Prior to the procedure, a History and Physical                            was performed, and patient medications and                            allergies were reviewed. The patient's tolerance of                            previous anesthesia was also reviewed. The risks                            and benefits of the procedure and the sedation                            options and risks were discussed with the patient.                            All questions were answered, and informed consent                            was obtained. Prior Anticoagulants: The patient has                            taken Xarelto (rivaroxaban), last dose was 2 days  prior to procedure. ASA Grade Assessment: III - A                            patient with severe systemic disease. After                            reviewing the risks and benefits, the patient was                            deemed in satisfactory condition to undergo the                            procedure.                           After obtaining informed consent, the endoscope was                     passed under direct vision. Throughout the                            procedure, the patient's blood pressure, pulse, and                            oxygen saturations were monitored continuously. The                            GIF-H190 ([id number]) Olympus gastroscope was                            introduced through the mouth, and advanced to the                            third part of duodenum. The upper GI endoscopy was                            accomplished without difficulty. The patient                            tolerated the procedure well. Scope In: Scope Out: Findings:      The larynx was normal.      The examined esophagus was normal.      Few non-bleeding small cratered and superficial gastric ulcers with       pigmented material were found on the greater curvature of the stomach,       on the lesser curvature of the stomach and in the gastric antrum.       Biopsies were taken with a cold forceps for histology of a few of the       ulcers and the antrum and the fundus to rule out Helicobacter.      A single small probable angiodysplastic lesion without bleeding was       found in the second portion of the duodenum.      The third portion of the duodenum was normal.      The exam was otherwise without abnormality.      One non-bleeding  small superficial duodenal ulcer was found in the       duodenal bulb.      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 Impression:               - Normal larynx.                           - Normal esophagus.                           - Non-bleeding small gastric ulcers with pigmented                            material. Biopsied.                           - A single non-bleeding ? Small angiodysplastic                            lesion in the duodenum.                           -  Normal third portion of the duodenum.                           - The examination was otherwise normal.                           - One non-bleeding small duodenal ulcer.                           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 Recommendation:           - Clear liquid diet today. Warned about aspirin and                            nonsteroidals in the future particularly if on                            blood thinners                           - Perform a colonoscopy tomorrow to be sure.                           - Use Protonix (pantoprazole) 40 mg PO daily                            indefinitely particularly if H. pylori negative.                           -  Await pathology results.                           - Return to GI clinic in 4 weeks.                           - Telephone GI clinic for pathology results in 1                            week.                           - Telephone GI clinic if symptomatic PRN. Consider                            capsule endoscopy in the future if iron deficiency                            and guaiac positivity continue despite pump                            inhibitors and no aspirin and nonsteroidals and                            possible APC of AVMs if confirmed Procedure Code(s):        --- Professional ---                           [id number], Esophagogastroduodenoscopy, flexible,                            transoral; with biopsy, single or multiple Diagnosis Code(s):        --- Professional ---                           K25.9, Gastric ulcer, unspecified as acute or                            chronic, without hemorrhage or perforation                           K31.819, Angiodysplasia of stomach and duodenum                            without bleeding                           D50.9, Iron deficiency anemia, unspecified                           Z87.11, Personal history of  peptic ulcer disease CPT copyright 2018 [employer]. All rights reserved. The codes documented in this report are preliminary and upon coder review may  be revised to meet current compliance requirements. Vida RiggerMarc Koven Belinsky, MD 03/29/2018 10:30:35 AM This report has been signed electronically. Number of Addenda: 0

## 2018-03-29 NOTE — Progress Notes (Signed)
Pharmacist Heart Failure Core Measure Documentation  Assessment: Jacob [name] has an EF documented as 20-25% on 03/28/18 by Echo.  Rationale: Heart failure patients with left ventricular systolic dysfunction (LVSD) and an EF < 40% should be prescribed an angiotensin converting enzyme inhibitor (ACEI) or angiotensin receptor blocker (ARB) at discharge unless a contraindication is documented in the medical record.  This patient is not currently on an ACEI or ARB for HF.  This note is being placed in the record in order to provide documentation that a contraindication to the use of these agents is present for this encounter.  ACE Inhibitor or Angiotensin Receptor Blocker is contraindicated (specify all that apply)  []   ACEI allergy AND ARB allergy []   Angioedema []   Moderate or severe aortic stenosis []   Hyperkalemia []   Hypotension []   Renal artery stenosis [x]   Worsening renal function, preexisting renal disease or dysfunction  Harland German, PharmD Clinical Pharmacist **Pharmacist phone directory can now be found on [email or web address] ([email or web address]).  Listed under [facility].

## 2018-03-29 NOTE — Transfer of Care (Signed)
Immediate Anesthesia Transfer of Care Note  Patient: Jacob [name]  Procedure(s) Performed: ESOPHAGOGASTRODUODENOSCOPY (EGD) WITH PROPOFOL (N/A ) BIOPSY  Patient Location: Endoscopy Unit  Anesthesia Type:MAC  Level of Consciousness: awake  Airway & Oxygen Therapy: Patient Spontanous Breathing  Post-op Assessment: Report given to RN and Post -op Vital signs reviewed and stable  Post vital signs: Reviewed and stable  Last Vitals:  Vitals Value Taken Time  BP 105/53 03/29/2018 10:23 AM  Temp 36.5 C 03/29/2018 10:20 AM  Pulse 93 03/29/2018 10:24 AM  Resp 13 03/29/2018 10:24 AM  SpO2 95 % 03/29/2018 10:24 AM  Vitals shown include unvalidated device data.  Last Pain:  Vitals:   03/29/18 1020  TempSrc: Oral  PainSc: 0-No pain         Complications: No apparent anesthesia complications

## 2018-03-29 NOTE — Anesthesia Preprocedure Evaluation (Addendum)
Anesthesia Evaluation  Patient identified by MRN, date of birth, ID band Patient awake    Reviewed: Allergy & Precautions, NPO status , Patient's Chart, lab work & pertinent test results  Airway Mallampati: II  TM Distance: >3 FB Neck ROM: Full    Dental  (+) Edentulous Upper, Poor Dentition, Dental Advisory Given,    Pulmonary Current Smoker,     + decreased breath sounds      Cardiovascular hypertension, + CAD, + Past MI, + Cardiac Stents, + CABG, + Peripheral Vascular Disease and +CHF  + dysrhythmias Atrial Fibrillation + Cardiac Defibrillator  Rhythm:Regular Rate:Normal     Neuro/Psych negative neurological ROS  negative psych ROS   GI/Hepatic PUD,   Endo/Other  diabetes, Type 2, Oral Hypoglycemic Agents, Insulin Dependent  Renal/GU Renal InsufficiencyRenal disease     Musculoskeletal   Abdominal (+) + obese,   Peds  Hematology   Anesthesia Other Findings   Reproductive/Obstetrics                            Lab Results  Component Value Date   WBC 7.8 03/28/2018   HGB 6.9 (LL) 03/28/2018   HCT 24.3 (L) 03/28/2018   MCV 68.1 (L) 03/28/2018   PLT 254 03/28/2018   Lab Results  Component Value Date   CREATININE 1.73 (H) 03/28/2018   BUN 41 (H) 03/28/2018   NA 128 (L) 03/28/2018   K 4.0 03/28/2018   CL 94 (L) 03/28/2018   CO2 25 03/28/2018   Echo: - Left ventricle: The cavity size was mildly dilated. Wall   thickness was normal. Systolic function was severely reduced. The   estimated ejection fraction was in the range of 25% to 30%.   Diffuse hypokinesis. The study was not technically sufficient to   allow evaluation of LV diastolic dysfunction due to atrial   fibrillation. - Aortic valve: Trileaflet; moderately calcified leaflets. There   was no stenosis. There was trivial regurgitation. - Mitral valve: Moderately calcified annulus. There was no   significant regurgitation. -  Left atrium: The atrium was moderately dilated. - Right ventricle: The cavity size was normal. Pacer wire or   catheter noted in right ventricle. Systolic function was   moderately reduced. - Right atrium: The atrium was mildly dilated. - Pulmonary arteries: No complete TR doppler jet so unable to   estimate PA systolic pressure. - Systemic veins: IVC measured 2.7 cm with < 50% respirophasic   variation, suggesting RA pressure 15 mmHg.  EKG: atrial fibrillation.   Anesthesia Physical Anesthesia Plan  ASA: IV  Anesthesia Plan: MAC   Post-op Pain Management:    Induction: Intravenous  PONV Risk Score and Plan: Propofol infusion and Ondansetron  Airway Management Planned: Natural Airway and Nasal Cannula  Additional Equipment: None  Intra-op Plan:   Post-operative Plan:   Informed Consent: I have reviewed the patients History and Physical, chart, labs and discussed the procedure including the risks, benefits and alternatives for the proposed anesthesia with the patient or authorized representative who has indicated his/her understanding and acceptance.   Dental advisory given  Plan Discussed with: CRNA  Anesthesia Plan Comments:         Anesthesia Quick Evaluation

## 2018-03-30 ENCOUNTER — Encounter (HOSPITAL_COMMUNITY): Payer: Self-pay | Admitting: *Deleted

## 2018-03-30 ENCOUNTER — Encounter (HOSPITAL_COMMUNITY)
Admission: EM | Discharge status: Against Medical Advice | Payer: Self-pay | Source: Home / Self Care | Attending: Internal Medicine

## 2018-03-30 ENCOUNTER — Inpatient Hospital Stay (HOSPITAL_COMMUNITY): Payer: Medicare Other | Admitting: Certified Registered Nurse Anesthetist

## 2018-03-30 ENCOUNTER — Inpatient Hospital Stay (HOSPITAL_COMMUNITY): Payer: Medicare Other

## 2018-03-30 DIAGNOSIS — D649 Anemia, unspecified: Secondary | ICD-10-CM

## 2018-03-30 DIAGNOSIS — I509 Heart failure, unspecified: Secondary | ICD-10-CM

## 2018-03-30 LAB — TYPE AND SCREEN
ABO/RH(D): O POS
Antibody Screen: NEGATIVE
Unit division: 0
Unit division: 0
Unit division: 0
Unit division: 0

## 2018-03-30 LAB — CBC WITH DIFFERENTIAL/PLATELET
Abs Immature Granulocytes: 0 10*3/uL (ref 0.00–0.07)
Basophils Absolute: 0 10*3/uL (ref 0.0–0.1)
Basophils Relative: 0 %
EOS PCT: 2 %
Eosinophils Absolute: 0.2 10*3/uL (ref 0.0–0.5)
HCT: 28.7 % — ABNORMAL LOW (ref 39.0–52.0)
Hemoglobin: 8 g/dL — ABNORMAL LOW (ref 13.0–17.0)
Lymphocytes Relative: 7 %
Lymphs Abs: 0.5 10*3/uL — ABNORMAL LOW (ref 0.7–4.0)
MCH: 19.9 pg — AB (ref 26.0–34.0)
MCHC: 27.9 g/dL — ABNORMAL LOW (ref 30.0–36.0)
MCV: 71.2 fL — AB (ref 80.0–100.0)
MONO ABS: 0.2 10*3/uL (ref 0.1–1.0)
Monocytes Relative: 2 %
Neutro Abs: 6.7 10*3/uL (ref 1.7–7.7)
Neutrophils Relative %: 89 %
Platelets: 226 10*3/uL (ref 150–400)
RBC: 4.03 MIL/uL — ABNORMAL LOW (ref 4.22–5.81)
RDW: 26.7 % — ABNORMAL HIGH (ref 11.5–15.5)
WBC: 7.5 10*3/uL (ref 4.0–10.5)
nRBC: 0 /100 WBC
nRBC: 0.5 % — ABNORMAL HIGH (ref 0.0–0.2)

## 2018-03-30 LAB — BPAM RBC
BLOOD PRODUCT EXPIRATION DATE: 202001292359
Blood Product Expiration Date: 202001112359
Blood Product Expiration Date: 202001292359
Blood Product Expiration Date: 202001292359
ISSUE DATE / TIME: 202001031431
ISSUE DATE / TIME: 202001040100
ISSUE DATE / TIME: 202001051257
ISSUE DATE / TIME: 202001041011
Unit Type and Rh: 5100
Unit Type and Rh: 5100
Unit Type and Rh: 5100
Unit Type and Rh: 5100

## 2018-03-30 LAB — COMPREHENSIVE METABOLIC PANEL
ALT: 17 U/L (ref 0–44)
AST: 22 U/L (ref 15–41)
Albumin: 2.8 g/dL — ABNORMAL LOW (ref 3.5–5.0)
Alkaline Phosphatase: 180 U/L — ABNORMAL HIGH (ref 38–126)
Anion gap: 8 (ref 5–15)
BILIRUBIN TOTAL: 2.1 mg/dL — AB (ref 0.3–1.2)
BUN: 38 mg/dL — ABNORMAL HIGH (ref 8–23)
CO2: 27 mmol/L (ref 22–32)
Calcium: 8.3 mg/dL — ABNORMAL LOW (ref 8.9–10.3)
Chloride: 94 mmol/L — ABNORMAL LOW (ref 98–111)
Creatinine, Ser: 1.51 mg/dL — ABNORMAL HIGH (ref 0.61–1.24)
GFR calc non Af Amer: 49 mL/min — ABNORMAL LOW (ref >60)
GFR, EST AFRICAN AMERICAN: 57 mL/min — AB (ref >60)
Glucose, Bld: 61 mg/dL — ABNORMAL LOW (ref 70–99)
Potassium: 4.1 mmol/L (ref 3.5–5.1)
Sodium: 129 mmol/L — ABNORMAL LOW (ref 135–145)
Total Protein: 7.4 g/dL (ref 6.5–8.1)

## 2018-03-30 LAB — GLUCOSE, CAPILLARY
Glucose-Capillary: 53 mg/dL — ABNORMAL LOW (ref 70–99)
Glucose-Capillary: 95 mg/dL (ref 70–99)
Glucose-Capillary: 75 mg/dL (ref 70–99)
Glucose-Capillary: 49 mg/dL — ABNORMAL LOW (ref 70–99)
Glucose-Capillary: 160 mg/dL — ABNORMAL HIGH (ref 70–99)
Glucose-Capillary: 177 mg/dL — ABNORMAL HIGH (ref 70–99)

## 2018-03-30 LAB — PHOSPHORUS: Phosphorus: 3.6 mg/dL (ref 2.5–4.6)

## 2018-03-30 LAB — MAGNESIUM: Magnesium: 2 mg/dL (ref 1.7–2.4)

## 2018-03-30 SURGERY — INVASIVE LAB ABORTED CASE
Anesthesia: Monitor Anesthesia Care

## 2018-03-30 MED ORDER — FLEET ENEMA 7-19 GM/118ML RE ENEM: 1.0000 | ENEMA | Freq: Once | RECTAL | Status: DC

## 2018-03-30 MED ORDER — DEXTROSE 50 % IV SOLN
INTRAVENOUS | Status: AC
  Filled 2018-03-30: qty 50

## 2018-03-30 MED ORDER — LACTATED RINGERS IV SOLN
INTRAVENOUS | Status: DC | PRN
  Administered 2018-03-30: 13:00:00 via INTRAVENOUS

## 2018-03-30 MED ORDER — MAGNESIUM HYDROXIDE 400 MG/5ML PO SUSP: 30.0000 mL | Freq: Once | ORAL | Status: DC

## 2018-03-30 MED ORDER — DEXTROSE 50 % IV SOLN
25.0000 g | INTRAVENOUS | Status: AC
  Administered 2018-03-30: 25 g via INTRAVENOUS

## 2018-03-30 MED ORDER — INSULIN GLARGINE 100 UNIT/ML ~~LOC~~ SOLN
25.0000 [IU] | Freq: Every day | SUBCUTANEOUS | Status: DC
  Filled 2018-03-30: qty 0.25

## 2018-03-30 MED ORDER — PROPOFOL 500 MG/50ML IV EMUL
INTRAVENOUS | Status: DC | PRN
  Administered 2018-03-30: 100 ug/kg/min via INTRAVENOUS

## 2018-03-30 SURGICAL SUPPLY — 22 items

## 2018-03-30 NOTE — Evaluation (Signed)
Occupational Therapy Evaluation Patient Details Name: Jacob [name] MRN: [id number] DOB: 01/11/1956 Today's Date: 03/30/2018    History of Present Illness Jacob [name] is a 63 y.o. male with a hx of systolic heart failure with EF 20-25% status post ICD 2014, RV dysfunction, CAD s/p CABG x2 w/ maze 2014, diabetes type 2 on insulin, chronic A. fib on Xarelto, GI bleed 11/2014, hyperlipidemia and noncompliance who is being seen today for the evaluation of atrial fibrillation and CHF at the request of Dr Marland Mcalpine.  HGB 6.9, possible colonoscopy 03/30/2018.   Clinical Impression   Pt is a 63 yo male s/p chest pain and above diagnoses. Pt PTA: pt speaks [place] well and runs a business here. Pt's son is attentive to care, but pt lives alone in first floor apartment. Pt was independent with ADLs/IADL and mobility. Pt currently, limited by lethargy this morning. Pt minguardA for UB ADL and MinA for LB ADL requiring increased time. Pt denying need for AE instruction at this time. Pt performing mobility with LOB episodes with minguardA no AD. When pt using RW for stability, no LOB episodes x50'. Pt tolerating session well. Pt's only c/o hunger at this time. No pain reported. Pt minguardA for transfers. Pt would benefit from continued OT skilled services for ADL, mobility and safety in HHOT setting and rolling walker for stability with movement. Thank you for this referral.    Follow Up Recommendations  Home health OT;Supervision - Intermittent    Equipment Recommendations  Other (comment)(Rolling walker)    Recommendations for Other Services       Precautions / Restrictions Precautions Precautions: Fall Restrictions Weight Bearing Restrictions: No      Mobility Bed Mobility               General bed mobility comments: NT in recliner  Transfers Overall transfer level: Needs assistance Equipment used: Rolling walker (2 wheeled) Transfers: Sit to/from Stand Sit to Stand: Supervision          General transfer comment: Cues for hand placement for stand to sit    Balance Overall balance assessment: Needs assistance Sitting-balance support: No upper extremity supported;Feet supported Sitting balance-Leahy Scale: Good       Standing balance-Leahy Scale: Fair Standing balance comment: can stand with and without UE support, but pt loses balance easily and has increased safety with RW                           ADL either performed or assessed with clinical judgement   ADL Overall ADL's : Needs assistance/impaired Eating/Feeding: Modified independent   Grooming: Wash/dry hands;Wash/dry face;Oral care;Set up;Supervision/safety   Upper Body Bathing: Min guard;Standing;Sitting   Lower Body Bathing: Moderate assistance;Sit to/from stand;Cueing for safety   Upper Body Dressing : Cueing for safety;Sitting;Set up   Lower Body Dressing: Sit to/from stand;Cueing for safety;Minimal assistance   Toilet Transfer: Set up;Grab bars   Toileting- Clothing Manipulation and Hygiene: Sit to/from stand;Cueing for safety;Minimal assistance       Functional mobility during ADLs: Min guard;Rolling walker General ADL Comments: Pt requiring assistance for LB ADL. May require AE to perform.     Vision Baseline Vision/History: Wears glasses Wears Glasses: Reading only Vision Assessment?: No apparent visual deficits     Perception     Praxis      Pertinent Vitals/Pain Pain Assessment: No/denies pain     Hand Dominance Right   Extremity/Trunk Assessment Upper Extremity Assessment Upper Extremity Assessment:  Generalized weakness   Lower Extremity Assessment Lower Extremity Assessment: Generalized weakness   Cervical / Trunk Assessment Cervical / Trunk Assessment: Normal   Communication Communication Communication: No difficulties   Cognition Arousal/Alertness: Awake/alert Behavior During Therapy: WFL for tasks assessed/performed Overall Cognitive Status: Within  Functional Limits for tasks assessed                                     General Comments  Pt speaks English quite well.    Exercises     Shoulder Instructions      Home Living Family/patient expects to be discharged to:: Private residence Living Arrangements: Alone Available Help at Discharge: Family;Friend(s);Available PRN/intermittently Type of Home: Apartment Home Access: Level entry     Home Layout: One level     Bathroom Shower/Tub: Producer, television/film/video: Handicapped height     Home Equipment: None          Prior Functioning/Environment Level of Independence: Independent                 OT Problem List: Decreased strength;Decreased activity tolerance;Impaired balance (sitting and/or standing);Decreased safety awareness;Increased edema      OT Treatment/Interventions: Self-care/ADL training;Therapeutic exercise;Energy conservation;Therapeutic activities;Patient/family education    OT Goals(Current goals can be found in the care plan section) Acute Rehab OT Goals Patient Stated Goal: to go home OT Goal Formulation: With patient Time For Goal Achievement: 03/30/18 Potential to Achieve Goals: Good ADL Goals Pt Will Perform Grooming: with set-up;standing Pt Will Perform Lower Body Dressing: with supervision Pt Will Transfer to Toilet: with set-up Additional ADL Goal #1: Pt will perform ADl functional mobility with least restrictive AD with Mod Independent  OT Frequency: Min 2X/week   Barriers to D/C: Decreased caregiver support          Co-evaluation              AM-PAC OT "6 Clicks" Daily Activity     Outcome Measure Help from another person eating meals?: None Help from another person taking care of personal grooming?: A Little Help from another person toileting, which includes using toliet, bedpan, or urinal?: A Little Help from another person bathing (including washing, rinsing, drying)?: A Little Help from  another person to put on and taking off regular upper body clothing?: A Little Help from another person to put on and taking off regular lower body clothing?: A Little 6 Click Score: 19   End of Session Equipment Utilized During Treatment: Gait belt;Rolling walker Nurse Communication: Mobility status  Activity Tolerance: Patient limited by lethargy;Patient tolerated treatment well Patient left: in chair;with call bell/phone within reach  OT Visit Diagnosis: Unsteadiness on feet (R26.81);Muscle weakness (generalized) (M62.81)                Time: 8127-5170 OT Time Calculation (min): 17 min Charges:  OT General Charges $OT Visit: 1 Visit OT Evaluation $OT Eval Moderate Complexity: 1 Mod  Jacob [name]) Glendell Docker OTR/L Acute Rehabilitation Services Pager: [phone] Office: [phone]   Jacob [name] 03/30/2018, 8:01 AM

## 2018-03-30 NOTE — Anesthesia Preprocedure Evaluation (Addendum)
Anesthesia Evaluation  Patient identified by MRN, date of birth, ID band Patient awake    Reviewed: Allergy & Precautions, NPO status , Patient's Chart, lab work & pertinent test results  Airway Mallampati: II  TM Distance: >3 FB Neck ROM: Full    Dental  (+) Edentulous Upper, Poor Dentition, Dental Advisory Given   Pulmonary Current Smoker,    Pulmonary exam normal        Cardiovascular hypertension, Pt. on medications + CAD, + Past MI, + Peripheral Vascular Disease and +CHF  CABG: 2014.  Normal cardiovascular exam+ dysrhythmias Atrial Fibrillation + Cardiac Defibrillator   TTE 03/28/18: EF 25-30%, diffuse hypokinesis, moderate LAE, mild RAE, RA pressure 41mHg    Neuro/Psych negative neurological ROS     GI/Hepatic Neg liver ROS, PUD,   Endo/Other  diabetes, Type 2, Oral Hypoglycemic Agents, Insulin DependentObese, BMI 39  Renal/GU Renal disease (Cardiorenal syndrome)     Musculoskeletal  (+) Arthritis ,   Abdominal   Peds  Hematology  (+) anemia , Hgb 8.0 on 03/30/18   Anesthesia Other Findings Day of surgery medications reviewed with the patient.  Reproductive/Obstetrics                          Anesthesia Physical Anesthesia Plan  ASA: III  Anesthesia Plan: MAC   Post-op Pain Management:    Induction: Intravenous  PONV Risk Score and Plan: 0  Airway Management Planned: Simple Face Mask  Additional Equipment:   Intra-op Plan:   Post-operative Plan:   Informed Consent: I have reviewed the patients History and Physical, chart, labs and discussed the procedure including the risks, benefits and alternatives for the proposed anesthesia with the patient or authorized representative who has indicated his/her understanding and acceptance.     Plan Discussed with: CRNA and Surgeon  Anesthesia Plan Comments:         Anesthesia Quick Evaluation

## 2018-03-30 NOTE — Progress Notes (Signed)
PROGRESS NOTE    Bronsen Tajima  WLS:[id number] DOB: 1955-08-22 DOA: 03/27/2018 PCP: Hoy Register, MD   Brief Narrative:   Tavish Mcgoey is a 63 y.o. male with medical history significant of full medical comorbidities including atrial fibrillation,, history of AICD placement, systolic CHF, chronic anticoagulation with Xarelto due to atrial fibrillation, hyperlipidemia, hypertension, history of MI, type 2 diabetes mellitus on insulin, history of noncompliance, hypertension, history of GI bleed and gastric hemorrhage and duodenal ulceration, as well as other comorbidities who presents with a chief complaint of shortness of breath, chest pain and chest fluttering.  Patient is nondescript on his symptoms and states that his shortness of breath is progressively gotten worse but in the last 2 days significantly worsened.  He states last night he started having some chest pain and fluttering of his heart.  Also states that he had some sharp abdominal pain which is resolved.  He got concerned because of his chest pain and shortness of breath.  He states that he woke up from sleep with heaviness shortness of breath and intermittent chest pain.  He states the chest pain has been intermittent and severe with no specific alleviating or aggravating factors.  He took home aspirin.  He has not noticed any blood in his stool or his urine has not been vomiting or been nauseous.  He states that he had symptoms a few weeks ago and his PCP approximately a week ago.  Went to the CHF clinic on 02/26/2018  ED Course: In the ED had basic blood work done and was typed and screened and started transfusion of 2 units of pRBCs. Fecal occult was negative and chest x-ray did show some vascular congestion.  EKG was also done.  EP to contact cardiology about this patient  **GI also evaluated given his symptomatic anemia and patient underwent EGD which showed non-bleeding gastric ulcers has a single nonbleeding questionable small  angiodysplastic lesion in the duodenum as well as a nonbleeding small duodenal ulcer.  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  Dr Ewing Schlein took biopsies and tested for H. pylori. Colonoscopy set up for tomorrow. Patient is still being diuresed cardiology is following we will continue to monitor blood count and transfuse an additional 1 unit today.  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 [facility]   Microcytic anemia   AF (atrial fibrillation) (HCC)   ICD - in place- BS May 2014 [place]   Tobacco abuse   PVD (peripheral vascular disease) (HCC)   DM (diabetes mellitus), type 2 with peripheral vascular complications (HCC)   Atrial fibrillation with controlled ventricular response (HCC)   Atherosclerosis of native arteries of extremity with intermittent claudication (HCC)   SOB (shortness of breath)   Chronic anticoagulation   Cardiomyopathy, ischemic   CHF (congestive heart failure) (HCC)   Congestive heart disease (HCC)   Chronic systolic heart failure (HCC)  Symptomatic Anemia -Admit to stepdown unit cardiac  -Patient's Hemoglobin/hematocrit on admission was 5.3/21.4 -Typed and screened and transfused 4 units PRBCs; Hb/Hct is now 8.0/28.7 -Prior to transfusion given IV Lasix 20 mg and in between units give another 20 mg -Continue diuresis with BID IV Lasix and defer to Cardiology for additional recommendations  Continue monitor for signs and symptoms bleeding -Patient's FOBT was negative -Continue with Xopenex for  breathing treatments as well as ipratropium scheduled -Anemia panel done and showed an iron level of 19, U IBC of 376, TIBC of 395, saturation of 5%, ferritin level of 8, folate level 15.9, and vitamin B12 level of 861 -Has a history of a duodenal ulcer strict hemorrhage -Continue  monitor hemoglobin very carefully -Repeat CBC in the a.m. -Gastroenterology Consulted for further evaluation and took the patient for EGD which showed the larynx was normal and esophagus was normal but there is few nonbleeding small cratered and superficial gastric ulcers which were biopsied to rule out H. pylori and there is also small probable angiodysplastic lesion with out bleeding found in the second portion of the duodenum as well as a nonbleeding small superficial duodenal ulcer found in the duodenal bulb.  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  Patient is wanting to leave AMA this morning but decided to stay and will see if he actually goes through with his prep for a colonoscopy scheduled for tomorrow -GI also recommends the patient be placed on Protonix 40 mg p.o. daily indefinitely and recommending clear liquid diet today and warning about aspirin and other nonsteroidals in the future if he is particularly on blood thinners -If colonoscopy is okay GI recommends placing patient back on his home Xarelto for now and monitor for signs and symptoms of bleeding  Acute Systolic CHF with last EF of 25-30% -In the setting of his symptomatic anemia -Has AICD in place -CXR showed "Persistent interstitial edema and right effusion with right base atelectasis. No worsening or new finding.." -Start diuresis with IV Lasix.  Patient states that his torsemide is not longer working -Takes torsemide and metolazone in the outpatient setting -We will defer to cardiology to restart these but for now we will continue IV Lasix and will increase to BID  continue now the patient is getting an additional unit of blood -Repeat echocardiogram patient was atrial fibrillation he had a mildly dilated left ventricle with EF of 25 to 30% with diffuse hypokinesis.  There is a normal RV size with moderately decreased systolic function and there is no significant valvular abnormalities.  There is a dilated IVC suggestive of elevated RV filling pressure -Continue spironolactone for now  -We will resume Imdur but currently hold hydralazine given his lower blood pressures but will need hydralazine added back when safe -Consulted cardiology for further evaluation and recommendations; -Strict I's and O's and daily weights -Patient is +321 Liters since Admission and Weight is Up 5 lbs -BNP was elevated 499 on admission -Continue monitor volume status carefully -Monitor daily CMP's and watch potassium as he is getting spironolactone  Atrial fibrillation -Currently not in RVR heart rates are currently ranging from high 90s to low low 100s currently -Has refused cardioversion in the past -Continue with cardiac monitoring -Holding Xarelto given his acute anemia but per GI may be able to resume if colonoscopy is okay -Continue with home digoxin and get digoxin level; Jackson level 0.4 -We will need further cardiac evaluation and appreciate Cardiology following  -Per cardiology if renal function were to continue today activity and further will need discontinued digoxin at some point  History of GI bleeding with duodenal  ulceration and clipping in 2005 -Denies any melena, hematochezia, coffee-ground emesis or hematemesis -Holding Xarelto given Hemglobin of 5.3 -S/p Transfusion of 4 units of pRBC's -Underwent EGD yesterday a.m. and will go undergo colonoscopy tomorrow given that the patient not clear for the colonoscopy  PAD -Had a last ABI done in 2015 -Follows with Dr. Allyson Sabal -C/w Statin -Continues to smoke and does not plan to quit  Chest pain rule out  ACS -Placed on cardiac telemetry and cycle cardiac troponins; chest pain has now resolved -Troponins have been Flat at 0.04 -Check echocardiogram -Have cardiology evaluate for further evaluation recommendations -Checked TSH and was 4.191 -UDS Negative -Likely with chest pain may be in the setting of his anemia -Continue to Monitor  Insulin-dependent diabetes mellitus -Hold his home glipizide -As hemoglobin A1c at his PCP office 6.5 -Continue with home regimen but reduce dosing to 25 units and place on NovoLog/scale insulin at rate of at bedtime component given lower blood sugars -CBG's ranging from 49-160 -Rpeat hemoglobin A1c in a.m. -Consult diabetes education coordinator for further evaluation recommendations  Acute kidney injury in the setting of CKD stage III, improved slightly  -In the setting of anemia and mild volume overload -BUN/Cr worsened and went from 37/1.68 -> 41/1.73 -> 46/1.78 -> 38/1.51 -Continue diuresis as above -Transfuse 4 units of PRBCs -Spironolactone -Avoid other nephrotoxic medications possible -To monitor and trend renal function -Avoid IV fluid given suspected CHF and c/w IV Diuresis  -Continue to Monitor closely as has a Hx of Cardiorenal -May need Nephrology involvement if does not improve significantly  Hyperlipidemia -Continue with Atorvastatin 40 mg p.o. daily  CAD with history of occlusion of a small RCA with collaterals and history of CABG with MAZE -Continue with isosorbide mononitrate and atorvastatin  Glaucoma -Continue with Latanoprost  Hyponatremia -Likely in the setting of suspected hypervolemic hyponatremia -Na+ went from 130 -> 128 -> 130 -> 129 -Continue monitor and trend sodium with diuresis -Repeat CMP in a.m. -May Need Nephrology involvement   Gout -Currently holding up Allopurinol will resume when kidney function function is improved  Hypertension -Patient blood pressure was on the lower side -We will currently  hold hydralazine 25 mg p.o. nightly but will continue isosorbide mononitrate -Holding home diuretics with metolazone and torsemide  Tobacco Abuse -Smoking cessation counseling given  Obesity -Estimated body mass index is 39.34 kg/m as calculated from the following:   Height as of this encounter: 5\' 5"  (1.651 m).   Weight as of this encounter: 107.2 kg. -Weight Loss Counseling given   Hyperbilirubinemia -Likely reactive -Patient's T bili was 1.5 and worsened to 2.1 -Continue monitor and trend and repeat CMP in a.m. -May require further workup if continues to worsen  DVT prophylaxis: SCDs Code Status: FULL CODE Family Communication: No family present at bedside Disposition Plan: Continue Diuresis and workup for Anemia  Consultants:   Cardiology  Gastroenterology    Procedures:  EGD  Findings:      The larynx was normal.      The examined esophagus was normal.      Few non-bleeding small cratered and superficial gastric ulcers with       pigmented material were found on the greater curvature of the stomach,       on the lesser curvature of the stomach and in the gastric antrum.       Biopsies were taken with a cold forceps for histology of a few of the       ulcers and  the antrum and the fundus to rule out Helicobacter.      A single small probable angiodysplastic lesion without bleeding was       found in the second portion of the duodenum.      The third portion of the duodenum was normal.      The exam was otherwise without abnormality.      One non-bleeding small superficial duodenal ulcer was found in the       duodenal bulb.      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 Impression:               - Normal larynx.                           - Normal esophagus.                           - Non-bleeding small gastric ulcers  with pigmented                            material. Biopsied.                           - A single non-bleeding ? Small angiodysplastic                            lesion in the duodenum.                           - Normal third portion of the duodenum.                           - The examination was otherwise normal.                           - One non-bleeding small duodenal ulcer.                           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  ECHOCARDIOGRAM ------------------------------------------------------------------- Study Conclusions  - Left ventricle: The cavity size was mildly dilated. Wall   thickness was normal. Systolic function was severely reduced. The   estimated ejection fraction was in the range of 25% to 30%.   Diffuse hypokinesis. The study was not technically sufficient to   allow evaluation of LV diastolic dysfunction due to atrial   fibrillation. - Aortic valve: Trileaflet; moderately calcified leaflets. There   was no stenosis. There was trivial regurgitation. - Mitral valve: Moderately calcified annulus. There was no   significant regurgitation. - Left atrium: The atrium was moderately dilated. - Right ventricle: The cavity size was normal. Pacer wire or   catheter noted in right ventricle. Systolic function was   moderately reduced. - Right atrium: The atrium was mildly dilated. - Pulmonary arteries: No complete TR  doppler jet so unable to   estimate PA systolic pressure. - Systemic veins: IVC measured 2.7 cm with < 50% respirophasic   variation, suggesting RA pressure 15 mmHg.  Impressions:  - The patient was in atrial fibrillation. Mildly dilated LV with EF   25-30%, diffuse hypokinesis. Normal RV size with moderately   decreased systolic function. No significant valvular   abnormalities. Dilated IVC suggestive of elevated RV filling   pressure.  COLONOSCOPY not done  yet    Antimicrobials: Anti-infectives (From admission, onward)   None     Subjective: Seen and examined at bedside prior to his colonoscopy and states that he was unable to drink his GoLYTELY because he states that he had 2 bowel movements last night and that was bothering him.  Wanted to sign AGAINST MEDICAL ADVICE this morning because he was not being fed.  I discussed with him the importance of him going under colonoscopy find out if he has any other source of bleeding and he was agreeable.  Went for a colonoscopy and was not clean so will attempt again tomorrow.  Patient's main complaint is leg swelling and states that his shortness of breath is improved but still has some.  Also was concerned about his blood sugar being low.  No other concerns or complaints at this time  Objective: Vitals:   03/30/18 1343 03/30/18 1350 03/30/18 1400 03/30/18 1537  BP: (!) 174/99 101/67 101/67 113/67  Pulse:  78 93 95  Resp: 17 (!) 21 15 15   Temp:    97.7 F (36.5 C)  TempSrc: Oral   Oral  SpO2: 94% 96% 99% 97%  Weight:      Height:        Intake/Output Summary (Last 24 hours) at 03/30/2018 1652 Last data filed at 03/30/2018 1430 Gross per 24 hour  Intake 460 ml  Output 1950 ml  Net -1490 ml   Filed Weights   03/27/18 1715 03/29/18 0446 03/30/18 0419  Weight: 107.3 kg 108.2 kg 107.2 kg   Examination: Physical Exam:  Constitutional: Well-nourished, well-developed obese male currently no acute distress appears agitated because he not get anything to eat Eyes: Lids and conjunctive are normal.  Sclera anicteric ENMT: External ears and nose appear normal.  Grossly normal hearing.  Mucous members are moist.  Has poor dentition Neck: Supple with some JVD Respiratory: Diminished auscultation bilaterally no appreciable wheezing, rales, rhonchi.  Does have some crackles and has a normal respiratory effort is not tachypneic Cardiovascular: Regularly irregular but rate controlled.  No appreciable  murmurs, rubs, gallops.  Has  2+ lower extremity edema Abdomen: Soft, mildly tender.  Distended secondary body habitus.  Bowel sounds present GU: Deferred Musculoskeletal: Good range of motion is no contractures.  Normal strength and muscle tone.  No joint deformities Skin: No rashes or lesions on limited skin evaluation Neurologic: Cranial nerves II through XII grossly intact no appreciable focal deficits Psychiatric: Awake and alert and somewhat agitated.  Normal judgment and insight  Data Reviewed: I have personally reviewed following labs and imaging studies  CBC: Recent Labs  Lab 03/27/18 1124 03/28/18 0546 03/29/18 0920 03/30/18 0449  WBC 6.4 7.8 7.6 7.5  NEUTROABS 5.4 5.9 5.7 6.7  HGB 5.3* 6.9* 7.3* 8.0*  HCT 21.3* 24.3* 26.8* 28.7*  MCV 66.6* 68.1* 69.3* 71.2*  PLT 244 254 256 226   Basic Metabolic Panel: Recent Labs  Lab 03/27/18 1124 03/27/18 1808 03/28/18 0546 03/29/18 0920 03/30/18 0449  NA 130*  --  128* 130* 129*  K 3.5  --  4.0 4.1 4.1  CL 96*  --  94* 93* 94*  CO2 25  --  25 28 27   GLUCOSE 168*  --  190* 112* 61*  BUN 37*  --  41* 46* 38*  CREATININE 1.68*  --  1.73* 1.78* 1.51*  CALCIUM 8.3*  --  8.1* 8.5* 8.3*  MG  --  2.0 2.0 2.0 2.0  PHOS  --   --  3.1  --  3.6   GFR: Estimated Creatinine Clearance: 58 mL/min (A) (by C-G formula based on SCr of 1.51 mg/dL (H)). Liver Function Tests: Recent Labs  Lab 03/28/18 0546 03/29/18 0920 03/30/18 0449  AST 24 24 22   ALT 15 18 17   ALKPHOS 195* 187* 180*  BILITOT 1.0 1.5* 2.1*  PROT 7.3 7.7 7.4  ALBUMIN 2.8* 2.8* 2.8*   No results for input(s): LIPASE, AMYLASE in the last 168 hours. No results for input(s): AMMONIA in the last 168 hours. Coagulation Profile: No results for input(s): INR, PROTIME in the last 168 hours. Cardiac Enzymes: Recent Labs  Lab 03/27/18 1808 03/27/18 2342 03/28/18 0546  TROPONINI 0.04* 0.04* 0.04*   BNP (last 3 results) No results for input(s): PROBNP in the last  8760 hours. HbA1C: No results for input(s): HGBA1C in the last 72 hours. CBG: Recent Labs  Lab 03/30/18 0758 03/30/18 0857 03/30/18 1125 03/30/18 1311 03/30/18 1334  GLUCAP 53* 95 75 49* 160*   Lipid Profile: No results for input(s): CHOL, HDL, LDLCALC, TRIG, CHOLHDL, LDLDIRECT in the last 72 hours. Thyroid Function Tests: Recent Labs    03/27/18 1900  TSH 4.191   Anemia Panel: No results for input(s): VITAMINB12, FOLATE, FERRITIN, TIBC, IRON, RETICCTPCT in the last 72 hours. Sepsis Labs: No results for input(s): PROCALCITON, LATICACIDVEN in the last 168 hours.  No results found for this or any previous visit (from the past 240 hour(s)).   Radiology Studies: Dg Chest Port 1 View  Result Date: 03/30/2018 CLINICAL DATA:  Shortness of breath. Previous CABG. EXAM: PORTABLE CHEST 1 VIEW COMPARISON:  03/28/2018 FINDINGS: Previous median sternotomy and CABG. Pacemaker/AICD remains in place. Interstitial edema pattern persists. Right effusion persists with right base atelectasis. IMPRESSION: Persistent interstitial edema and right effusion with right base atelectasis. No worsening or new finding. Electronically Signed   By: Paulina Fusi M.D.   On: 03/30/2018 07:22   Scheduled Meds: . sodium chloride   Intravenous Once  . atorvastatin  40 mg Oral q1800  . digoxin  62.5 mcg Oral Daily  . furosemide  40 mg Intravenous BID  . insulin aspart  0-15 Units Subcutaneous TID WC  . insulin glargine  25 Units Subcutaneous Q2200  . ipratropium  0.5 mg Nebulization BID  . isosorbide mononitrate  30 mg Oral Daily  . latanoprost  1 drop Both Eyes Daily  . levalbuterol  0.63 mg Nebulization BID  . magnesium hydroxide  30 mL Oral Once  . pantoprazole  40 mg Oral Daily  . sodium chloride flush  3 mL Intravenous Q12H  . sodium phosphate  1 enema Rectal Once  . sodium phosphate  1 enema Rectal Once  . spironolactone  12.5 mg Oral Daily   Continuous Infusions: . sodium chloride    . sodium  chloride      LOS: 3 days   Merlene Laughter, DO [facility] PAGER is on AMION  If 7PM-7AM, please contact night-coverage [email or web address] Password [facility] 03/30/2018, 4:52 PM

## 2018-03-30 NOTE — Care Management Note (Signed)
Case Management Note  Patient Details  Name: Jacob [name] MRN: [id number] Date of Birth: 1955-12-26  Subjective/Objective:  Pt presented for SOB and Chest Pain. PTA from home alone. Pt has support of his neighbor.         Action/Plan: CM did speak with patient and he wants to utilize [facility]- has used in the past. Pt agreeable to [facility] Services and needs assistance with transportation to MD appointments. CM did make a referral with Jesusita Oka of [facility]- SOC to begin within 24-48 hours post transition home. No further needs from CM at this time.   Expected Discharge Date:                  Expected Discharge Plan:  Home w Home Health Services  In-House Referral:  NA  Discharge planning Services  CM Consult  Post Acute Care Choice:  Home Health Choice offered to:  Patient  DME Arranged:  N/A DME Agency:  NA  HH Arranged:  RN, Disease Management, PT, OT, Social Work [employer] Agency:  [facility]  Status of Service:  Completed, signed off  If discussed at [employer], dates discussed:    Additional Comments:  Gala Lewandowsky, RN 03/30/2018, 12:35 PM

## 2018-03-30 NOTE — Progress Notes (Signed)
I was unable to do patient's colonoscopy because he had marked amount of formed stool in the rectum, almost a fecal impaction, which is at odds with the report that he apparently drank a gallon of Nulytely and had yellow liquid stool.  I will maintain the patient on a clear liquid diet and begin a cleanout with laxatives and enemas, which he needs in any event.  Tomorrow, I will plan to talk with him about whether he wants to try undergoing a repeat colonoscopy prep and a repeat attempt at colonoscopy.  Florencia Reasons, M.D. Pager [phone] If no answer or after 5 PM call [phone]

## 2018-03-30 NOTE — Anesthesia Procedure Notes (Signed)
Procedure Name: MAC Date/Time: 03/30/2018 1:20 PM Performed by: Harden Mo, CRNA Pre-anesthesia Checklist: Patient identified, Emergency Drugs available, Suction available and Patient being monitored Patient Re-evaluated:Patient Re-evaluated prior to induction Oxygen Delivery Method: Simple face mask Preoxygenation: Pre-oxygenation with 100% oxygen Induction Type: IV induction Placement Confirmation: positive ETCO2 and breath sounds checked- equal and bilateral Dental Injury: Teeth and Oropharynx as per pre-operative assessment

## 2018-03-30 NOTE — Op Note (Signed)
[facility] Patient Name: Jacob [name] Procedure Date : 03/30/2018 MRN: [id number] Attending MD: Bernette Redbird , MD Date of Birth: 1955/12/31 CSN: [id number] Age: 63 Admit Type: Inpatient Procedure:                Colonoscopy Indications:              Unexplained iron deficiency anemia (hgb 6.9, low                            ferritin and iron sat, stool heme neg at time of                            admission) Providers:                Bernette Redbird, MD, Zoe Lan, RN, Harrington Challenger,                            Technician Referring MD:              Medicines:                Monitored Anesthesia Care Complications:            No immediate complications. Estimated Blood Loss:     Estimated blood loss: none. Procedure:                Pre-Anesthesia Assessment:                           - Prior to the procedure, a History and Physical                            was performed, and patient medications and                            allergies were reviewed. The patient's tolerance of                            previous anesthesia was also reviewed. The risks                            and benefits of the procedure and risks were                            discussed with the patient. All questions were                            answered, and informed consent was obtained. Prior                            Anticoagulants: The patient has taken Xarelto                            (rivaroxaban), last dose was 4 days prior to                            procedure. ASA  Grade Assessment: III - A patient                            with severe systemic disease. After reviewing the                            risks and benefits, the patient was deemed in                            satisfactory condition to undergo the procedure.                           After obtaining informed consent, the colonoscope                            was passed under direct vision. Throughout the                  procedure, the patient's blood pressure, pulse, and                            oxygen saturations were monitored continuously. The                            CF-HQ190L ([id number] Olympus colonoscope was                            introduced through the anus with the intention of                            advancing to the cecum. The scope was advanced to                            the rectum before the procedure was aborted due to                            a large amount of formed stool. Medications were                            given. The colonoscopy was technically difficult                            and complex due to inadequate bowel prep. The                            patient tolerated the procedure well. The quality                            of the bowel preparation was unsatisfactory. Scope In: Scope Out: Findings:      The digital rectal exam was normal except for a large amount of formed,       firm but not hard, scyballous stool which was manually disimpacted for       as far as I could reach with my finger. Pertinent negatives include  normal prostate (size, shape, and consistency).      The rectum appeared normal.      A large amount of solid stool was found in the proximal rectum,       precluding visualization.      The retroflexed view of the distal rectum and anal verge was normal and       showed no anal or rectal abnormalities. Impression:               - Preparation of the colon was unsatisfactory.                           - The distal rectum is normal.                           - Large amount of stool in the proximal rectum.                           - The distal rectum and anal verge are normal on                            retroflexion view.                           - No specimens collected. Recommendation:           - Repeat colonoscopy at appointment to be scheduled                            because the bowel preparation was poor, IF  the                            patient is agreeable. Procedure Code(s):        --- Professional ---                           [phone], 53, Colonoscopy, flexible; diagnostic,                            including collection of specimen(s) by brushing or                            washing, when performed (separate procedure) Diagnosis Code(s):        --- Professional ---                           D50.9, Iron deficiency anemia, unspecified CPT copyright 2018 [employer]. All rights reserved. The codes documented in this report are preliminary and upon coder review may  be revised to meet current compliance requirements. Bernette Redbird, MD 03/30/2018 1:52:16 PM This report has been signed electronically. Number of Addenda: 0

## 2018-03-30 NOTE — Interval H&P Note (Signed)
History and Physical Interval Note:  03/30/2018 1:07 PM  Jacob [name]  has presented today for surgery, with the diagnosis of anemia   After consideration of risks and benefits for treatment, the patient has consented to  Procedure(s): COLONOSCOPY WITH PROPOFOL (N/A) as a surgical intervention .  The patient's history has been reviewed, patient examined, no change in status, stable for surgery.  I have reviewed the patient's chart and labs.  Questions were answered to the patient's satisfaction.     Katy Fitch Taige Housman

## 2018-03-30 NOTE — Anesthesia Postprocedure Evaluation (Signed)
Anesthesia Post Note  Patient: Jacob [name]  Procedure(s) Performed: aborted colonoscopy     Patient location during evaluation: PACU Anesthesia Type: MAC Level of consciousness: awake and alert Pain management: pain level controlled Vital Signs Assessment: post-procedure vital signs reviewed and stable Respiratory status: spontaneous breathing, nonlabored ventilation, respiratory function stable and patient connected to nasal cannula oxygen Cardiovascular status: stable and blood pressure returned to baseline Postop Assessment: no apparent nausea or vomiting Anesthetic complications: no    Last Vitals:  Vitals:   03/30/18 1343 03/30/18 1350  BP: (!) 174/99 101/67  Pulse:  78  Resp: 17 (!) 21  Temp:    SpO2: 94% 96%    Last Pain:  Vitals:   03/30/18 1350  TempSrc:   PainSc: 0-No pain                 Branda Chaudhary DAVID

## 2018-03-30 NOTE — Care Management Important Message (Signed)
Important Message  Patient Details  Name: Jacob [name] MRN: [id number] Date of Birth: Nov 03, 1955   Medicare Important Message Given:  Yes    Oralia Rud Paiden Caraveo 03/30/2018, 4:35 PM

## 2018-03-30 NOTE — Progress Notes (Signed)
Hypoglycemic Event  CBG: 49 at 1310 Treatment: D50 50 mL (25 gm)  Symptoms: None  Follow-up CBG: Time:1330 CBG Result:160  Possible Reasons for Event: Inadequate meal intake  Comments/MD notified:Dr. Edwyna Shell L Cassady Stanczak

## 2018-03-30 NOTE — Transfer of Care (Signed)
Immediate Anesthesia Transfer of Care Note  Patient: Jacob [name]  Procedure(s) Performed: aborted colonoscopy  Patient Location: Endoscopy Unit  Anesthesia Type:MAC  Level of Consciousness: awake  Airway & Oxygen Therapy: Patient Spontanous Breathing  Post-op Assessment: Report given to RN, Post -op Vital signs reviewed and stable and Patient moving all extremities X 4  Post vital signs: Reviewed and stable  Last Vitals:  Vitals Value Taken Time  BP    Temp    Pulse    Resp 16 03/30/2018  1:43 PM  SpO2    Vitals shown include unvalidated device data.  Last Pain:  Vitals:   03/30/18 1254  TempSrc: Oral  PainSc: 0-No pain         Complications: No apparent anesthesia complications

## 2018-03-30 NOTE — Progress Notes (Signed)
Around 8 AM patient stated that he wanted to leave without having colonoscopy performed because he could not eat. I told the patient that the physician would not discharge him at this time and that he would have to leave AMA. Removed IV and took the patient off the monitor. Patient then asked for a taxi voucher. I explained to the patient that we don't provide taxi vouchers for patients that sign out AMA but we could give him a bus pass. Patient declined the bus pass. I told him that he would need to find his own way home. At 930 AM, went patients room and he told me that he decided to stay and have the procedure done. Patient was placed back on the monitor and IV was restarted. The patient did go down to the procedure as planned and was unsuccessful because the patient did not complete the prep as ordered. Patient came back to the floor and was provided food as asked. Around 1715, I went to the patient's room to get his blood sugar and patient stated that he wanted to leave tonight. I told him once again that the doctor would not be discharging him and that he would have to leave against medical advice and unless he wanted a bus pass, he had to find his own way home. Patient made a few phone calls and was able to find some cash for a cab. Patient understands the risks of leaving against medical advice. Charge RN took IV out and took patient off the monitor. Cab was called. Charge RN took patient to the ED to meet the cab. Charge RN stayed with the patient till the cab arrived and made sure patient was in the cab safely.   Tera Helper E

## 2018-03-30 NOTE — Progress Notes (Signed)
PT Cancellation Note  Patient Details Name: Jacob [name] MRN: [id number] DOB: February 21, 1956   Cancelled Treatment:    Reason Eval/Treat Not Completed: Patient at procedure or test/unavailable(Pt in endo.  Will return tomorrow. )   [name] 03/30/2018, 1:00 PM  Kennethia Lynes,PT Acute Rehabilitation Services Pager:  [phone]  Office:  [phone]

## 2018-03-31 ENCOUNTER — Emergency Department (HOSPITAL_COMMUNITY): Payer: Medicare Other

## 2018-03-31 ENCOUNTER — Encounter (HOSPITAL_COMMUNITY): Payer: Self-pay

## 2018-03-31 ENCOUNTER — Telehealth (HOSPITAL_COMMUNITY): Payer: Self-pay

## 2018-03-31 ENCOUNTER — Other Ambulatory Visit: Payer: Self-pay

## 2018-03-31 ENCOUNTER — Emergency Department (HOSPITAL_COMMUNITY)
Admission: EM | Admit: 2018-03-31 | Discharge: 2018-03-31 | Disposition: A | Discharge status: Home / Self Care | Payer: Medicare Other | Attending: Emergency Medicine | Admitting: Emergency Medicine

## 2018-03-31 ENCOUNTER — Telehealth: Payer: Self-pay

## 2018-03-31 DIAGNOSIS — R600 Localized edema: Secondary | ICD-10-CM | POA: Insufficient documentation

## 2018-03-31 DIAGNOSIS — Z951 Presence of aortocoronary bypass graft: Secondary | ICD-10-CM | POA: Diagnosis not present

## 2018-03-31 DIAGNOSIS — R0602 Shortness of breath: Secondary | ICD-10-CM | POA: Diagnosis not present

## 2018-03-31 DIAGNOSIS — R079 Chest pain, unspecified: Secondary | ICD-10-CM | POA: Diagnosis not present

## 2018-03-31 DIAGNOSIS — Z95810 Presence of automatic (implantable) cardiac defibrillator: Secondary | ICD-10-CM | POA: Diagnosis not present

## 2018-03-31 DIAGNOSIS — I2510 Atherosclerotic heart disease of native coronary artery without angina pectoris: Secondary | ICD-10-CM | POA: Insufficient documentation

## 2018-03-31 DIAGNOSIS — I4891 Unspecified atrial fibrillation: Secondary | ICD-10-CM | POA: Insufficient documentation

## 2018-03-31 DIAGNOSIS — Z794 Long term (current) use of insulin: Secondary | ICD-10-CM | POA: Insufficient documentation

## 2018-03-31 DIAGNOSIS — R000 Tachycardia, unspecified: Secondary | ICD-10-CM | POA: Diagnosis not present

## 2018-03-31 DIAGNOSIS — Z7901 Long term (current) use of anticoagulants: Secondary | ICD-10-CM | POA: Insufficient documentation

## 2018-03-31 DIAGNOSIS — F17210 Nicotine dependence, cigarettes, uncomplicated: Secondary | ICD-10-CM | POA: Diagnosis not present

## 2018-03-31 DIAGNOSIS — D649 Anemia, unspecified: Secondary | ICD-10-CM

## 2018-03-31 DIAGNOSIS — Z720 Tobacco use: Secondary | ICD-10-CM

## 2018-03-31 DIAGNOSIS — I110 Hypertensive heart disease with heart failure: Secondary | ICD-10-CM | POA: Insufficient documentation

## 2018-03-31 DIAGNOSIS — R0789 Other chest pain: Secondary | ICD-10-CM | POA: Diagnosis not present

## 2018-03-31 DIAGNOSIS — Z79899 Other long term (current) drug therapy: Secondary | ICD-10-CM | POA: Diagnosis not present

## 2018-03-31 DIAGNOSIS — E119 Type 2 diabetes mellitus without complications: Secondary | ICD-10-CM | POA: Insufficient documentation

## 2018-03-31 DIAGNOSIS — I5022 Chronic systolic (congestive) heart failure: Secondary | ICD-10-CM | POA: Insufficient documentation

## 2018-03-31 DIAGNOSIS — R5383 Other fatigue: Secondary | ICD-10-CM | POA: Diagnosis not present

## 2018-03-31 DIAGNOSIS — E1165 Type 2 diabetes mellitus with hyperglycemia: Secondary | ICD-10-CM | POA: Diagnosis not present

## 2018-03-31 DIAGNOSIS — Z955 Presence of coronary angioplasty implant and graft: Secondary | ICD-10-CM | POA: Diagnosis not present

## 2018-03-31 LAB — BASIC METABOLIC PANEL
Anion gap: 9 (ref 5–15)
BUN: 27 mg/dL — ABNORMAL HIGH (ref 8–23)
CO2: 27 mmol/L (ref 22–32)
Calcium: 8.7 mg/dL — ABNORMAL LOW (ref 8.9–10.3)
Chloride: 95 mmol/L — ABNORMAL LOW (ref 98–111)
Creatinine, Ser: 1.35 mg/dL — ABNORMAL HIGH (ref 0.61–1.24)
GFR calc Af Amer: >60 mL/min (ref >60)
GFR calc non Af Amer: 56 mL/min — ABNORMAL LOW (ref >60)
GLUCOSE: 232 mg/dL — AB (ref 70–99)
Potassium: 3.9 mmol/L (ref 3.5–5.1)
Sodium: 131 mmol/L — ABNORMAL LOW (ref 135–145)

## 2018-03-31 LAB — CBC
HCT: 30.3 % — ABNORMAL LOW (ref 39.0–52.0)
Hemoglobin: 8.2 g/dL — ABNORMAL LOW (ref 13.0–17.0)
MCH: 19.8 pg — AB (ref 26.0–34.0)
MCHC: 27.1 g/dL — ABNORMAL LOW (ref 30.0–36.0)
MCV: 73.2 fL — ABNORMAL LOW (ref 80.0–100.0)
Platelets: 229 10*3/uL (ref 150–400)
RBC: 4.14 MIL/uL — ABNORMAL LOW (ref 4.22–5.81)
RDW: 27.9 % — ABNORMAL HIGH (ref 11.5–15.5)
WBC: 7.9 10*3/uL (ref 4.0–10.5)
nRBC: 0 % (ref 0.0–0.2)

## 2018-03-31 LAB — TYPE AND SCREEN
ABO/RH(D): O POS
Antibody Screen: NEGATIVE

## 2018-03-31 LAB — I-STAT TROPONIN, ED: TROPONIN I, POC: 0.02 ng/mL (ref 0.00–0.08)

## 2018-03-31 MED ORDER — FUROSEMIDE 10 MG/ML IJ SOLN
40.0000 mg | Freq: Once | INTRAMUSCULAR | Status: AC
  Administered 2018-03-31: 40 mg via INTRAVENOUS
  Filled 2018-03-31: qty 4

## 2018-03-31 NOTE — Discharge Instructions (Addendum)
Stop smoking. Do not take Xarelto until seen by your PCP.

## 2018-03-31 NOTE — Telephone Encounter (Signed)
Transition Care Management Follow-up Telephone Call Date of discharge and from where:  03/30/2018, [facility]. Patient left AMA.  The patient stated that he is tired and he continues to have chest pain and is short of breath. He said that he has fluid in his lungs and in his legs.  This CM stressed the importance of calling 911 to return to the hospital for medical attention. He said that he " feels sick" and he doesn't know what to do.  This CM again strongly urged him to call 911 as he will not get better without seeking medical attention. He left the hospital before his treatment was completed.  He then said that he would call 911 now.

## 2018-03-31 NOTE — ED Notes (Signed)
This RN verbally discussed d/c paperwork with pt. Pt was upset because we could not give him a cab voucher to his residence. I offered pt a bus pass, I also offered for pt to wait in lobby until he was able to get a ride. Pt refused all options. Pt stated he wasn't going to leave because he could not get a ride home. Security was called. I offered pt a bus pass again. Pt was able to have a ride home from the hospital earlier today when he left AMA. Pt stated that person could not come back. I again offered pt a bus pass and he again refused. Pt walked out to lobby with security, refusing his d/c paperwork

## 2018-03-31 NOTE — Discharge Summary (Addendum)
Physician Discharge Summary  Jacob [name] ZOX:[id number] DOB: September 15, 1955 DOA: 03/27/2018  PCP: Hoy Register, MD  Admit date: 03/27/2018 Discharge date: 03/31/2018  Admitted From: Home Disposition: LEFT AMA  Recommendations for Outpatient Follow-up:  1. Follow up with PCP in 1-2 weeks 2. Follow up with Cardiology within 1-2 weeks 3. Follow up with Gastroenterology 4. Please obtain CMP/CBC, Mag, Phos in one week 5. Please follow up on the following pending results:  Home Health: No  Equipment/Devices: None    Discharge Condition: Guarded CODE STATUS: FULL CODE  Diet recommendation: Heart Healthy Carb Modified Fluid Restricted 1200 mL Diet   Brief/Interim Summary: Brief Narrative:  Jacob Guiles Abdois a 62 y.o.malewith medical history significant offull medical comorbidities including atrial fibrillation,, history of AICD placement, systolic CHF, chronic anticoagulation with Xarelto due to atrial fibrillation, hyperlipidemia, hypertension, history of MI, type 2 diabetes mellitus on insulin, history of noncompliance, hypertension, history of GI bleed and gastric hemorrhage and duodenal ulceration, as well as other comorbidities who presents with a chief complaint of shortness of breath, chest pain and chest fluttering. Patient is nondescript on his symptoms and states that his shortness of breath is progressively gotten worse but in the last 2 days significantly worsened. He states last night he started having some chest pain and fluttering of his heart. Also states that he had some sharp abdominal pain which is resolved. He got concerned because of his chest pain and shortness of breath. He states that he woke up from sleep with heaviness shortness of breath and intermittent chest pain.He states the chest pain has been intermittent and severe with no specific alleviating or aggravating factors. He took home aspirin. He has not noticed any blood in his stool or his urine has not been vomiting  or been nauseous. He states that he had symptoms a few weeks ago and his PCP approximately a week ago. Went to the CHF clinic on 02/26/2018  ED Course:In the ED had basic blood work done and was typed and screened and started transfusion of 2 units ofpRBCs.Fecal occult was negative and chest x-ray did show some vascular congestion. EKG was also done. EP to contact cardiology about this patient  **GI also evaluated given his symptomatic anemia and patient underwent EGD which showed non-bleeding gastric ulcers has a single nonbleeding questionable small angiodysplastic lesion in the duodenum as well as a nonbleeding small duodenal ulcer.  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  Dr Ewing Schlein took biopsies and tested for H. pylori. Colonoscopy set up for tomorrow. Patient is still being diuresed cardiology is following we will continue to monitor blood count and transfuse an additional 1 unit today.  GI recommending placing the patient on Protonix 40 mg daily  Throughout the day the patient threatened to leave AGAINST MEDICAL ADVICE but however the later in the evening patient wanted to leave AGAINST MEDICAL ADVICE and actually wanted to go home so he decided to leave.  Patient was awake and alert and oriented I told him that he is not stable for discharge and that he will be leaving AGAINST MEDICAL ADVICE if he did not stay to complete his treatment and get diuresed.  Patient still decided to leave AGAINST MEDICAL ADVICE and understood the risks of leaving AGAINST MEDICAL ADVICE including worsening, decompensation and even death.  He proceeded to leave and a cab was called and he got into the cab and left the hospital.  Discharge Diagnoses:  Active Problems:   Essential hypertension   CAD- s/p CABG July 2014 [facility]   Microcytic anemia   AF (atrial fibrillation) (HCC)   ICD - in place- BS May 2014 [place]    Tobacco abuse   PVD (peripheral vascular disease) (HCC)   DM (diabetes mellitus), type 2 with peripheral vascular complications (HCC)   Atrial fibrillation with controlled ventricular response (HCC)   Atherosclerosis of native arteries of extremity with intermittent claudication (HCC)   SOB (shortness of breath)   Chronic anticoagulation   Cardiomyopathy, ischemic   CHF (congestive heart failure) (HCC)   Congestive heart disease (HCC)   Chronic systolic heart failure (HCC)  Symptomatic Anemia -Admit to stepdown unit cardiac  -Patient's Hemoglobin/hematocrit on admission was 5.3/21.4 -Typed and screened and transfused 4 units PRBCs; Hb/Hct is now 8.0/28.7 -Prior to transfusion given IV Lasix 20 mg and in between units give another 20 mg -Continue diuresis with BID IV Lasix and defer to Cardiology for additional recommendations  Continue monitor for signs and symptoms bleeding -Patient's FOBT was negative -Continue with Xopenex for breathing treatments as well as ipratropium scheduled -Anemia panel done and showed an iron level of 19, U IBC of 376, TIBC of 395, saturation of 5%, ferritin level of 8, folate level 15.9, and vitamin B12 level of 861 -Has a history of a duodenal ulcer strict hemorrhage -Continue monitor hemoglobin very carefully -Repeat CBC in the a.m. -Gastroenterology Consulted for further evaluation and took the patient for EGD which showed the larynx was normal and esophagus was normal but there is few nonbleeding small cratered and superficial gastric ulcers which were biopsied to rule out H. pylori and there is also small probable angiodysplastic lesion with out bleeding found in the second portion of the duodenum as well as a nonbleeding small superficial duodenal ulcer found in the duodenal bulb.  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  Patient is wanting to leave AMA this morning but decided to stay and will see if he actually goes through with his prep for a colonoscopy scheduled for tomorrow -GI also recommends the patient be placed on Protonix 40 mg p.o. daily indefinitely and recommending clear liquid diet today and warning about aspirin and other nonsteroidals in the future if he is particularly on blood thinners -If colonoscopy is okay GI recommends placing patient back on his home Xarelto for now and monitor for signs and symptoms of bleeding -Patient left AGAINST MEDICAL ADVICE even though the risks of leaving were discussed with him.  He is of sound mind making his own decisions and awake and alert and oriented.  Acute Systolic CHF with last EF of 25-30% -In the setting of his symptomatic anemia -Has AICD in place -CXR showed "Persistent interstitial edema and right effusion with right base atelectasis. No worsening or new finding.." -Start diuresis with IV Lasix. Patient states that his torsemide is not longer working -Takes torsemide and metolazone in the outpatient setting -We will defer to cardiology to restart these but for now we will continue IV Lasix and will increase to BID continue now the patient is getting an additional unit of blood -Repeat echocardiogram patient was atrial fibrillation he  had a mildly dilated left ventricle with EF of 25 to 30% with diffuse hypokinesis.  There is a normal RV size with moderately decreased systolic function and there is no significant valvular abnormalities.  There is a dilated IVC suggestive of elevated RV filling pressure -Continue spironolactone for now -We will resume Imdur but currently hold hydralazine given his lower blood pressures but will  need hydralazine added backwhen safe -Consulted cardiology for further evaluation and recommendations; -Strict I's and O's and daily weights -Patient is +321 Liters since Admission and Weight is Up 5 lbs -BNP was elevated 499 on admission -Continue monitor volume status carefully -Monitor daily CMP's and watch potassium as he is getting spironolactone -Patient left AGAINST MEDICAL ADVICE even though the risks of leaving were discussed with him.  He is of sound mind making his own decisions and awake and alert and oriented.  Atrial fibrillation -Currently not in RVR heart rates are currently ranging from high 90s to low low 100s currently -Has refused cardioversion in the past -Continue with cardiac monitoring -Holding Xarelto given his acute anemia but per GI may be able to resume if colonoscopy is okay -Continue with home digoxin and get digoxin level; Jackson level 0.4 -We will need further cardiac evaluation and appreciate Cardiology following  -Per cardiology if renal function were to continue today activity and further will need discontinued digoxin at some point -Patient left AGAINST MEDICAL ADVICE even though the risks of leaving were discussed with him.  He is of sound mind making his own decisions and awake and alert and oriented.  History of GI bleeding with duodenal ulceration and clipping in 2005 -Denies any melena, hematochezia, coffee-ground emesis or hematemesis -Holding Xarelto given Hemglobin of 5.3 -S/p Transfusion of 4 units of pRBC's -Underwent EGD yesterday a.m. and will go undergo colonoscopy tomorrow given that the patient not clear for the colonoscopy -Patient left AGAINST MEDICAL ADVICE even though the risks of leaving were discussed with him.  He is of sound mind making his own decisions and awake and alert and oriented.  PAD -Had a last ABI done in 2015 -Follows with Dr. Allyson Sabal -C/w Statin -Continues to smoke and does not plan to quit -Patient left AGAINST  MEDICAL ADVICE even though the risks of leaving were discussed with him.  He is of sound mind making his own decisions and awake and alert and oriented.  Chest pain rule out ACS -Placed on cardiac telemetry and cycle cardiac troponins; chest pain has now resolved -Troponins have been Flat at 0.04 -Check echocardiogram -Have cardiology evaluate for further evaluation recommendations -Checked TSH and was 4.191 -UDS Negative -Likely with chest pain may be in the setting of his anemia -Continue to Monitor -Patient left AGAINST MEDICAL ADVICE even though the risks of leaving were discussed with him.  He is of sound mind making his own decisions and awake and alert and oriented.  Insulin-dependent diabetes mellitus -Hold his home glipizide -As hemoglobin A1c at his PCP office 6.5 -Continue with home regimen but reduce dosing to 25 units and place on NovoLog/scale insulin at rate of at bedtime component given lower blood sugars -CBG's ranging from 49-160 -Rpeat hemoglobin A1c in a.m. -Consult diabetes education coordinator for further evaluation recommendations -Patient left AGAINST MEDICAL ADVICE even though the risks of leaving were discussed with him.  He is of sound mind making his own decisions and awake and alert and oriented.  Acute kidney injury in the setting of CKD stage III, improved slightly  -In  the setting of anemia and mild volume overload -BUN/Cr worsened and went from 37/1.68 -> 41/1.73 -> 46/1.78 -> 38/1.51 -Continue diuresis as above -Transfuse 4 units of PRBCs -Spironolactone -Avoid other nephrotoxic medications possible -To monitor and trend renal function -Avoid IV fluid given suspected CHF and c/w IV Diuresis  -Continue to Monitor closely as has a Hx of Cardiorenal -May need Nephrology involvement if does not improve significantly -Patient left AGAINST MEDICAL ADVICE even though the risks of leaving were discussed with him.  He is of sound mind making his own  decisions and awake and alert and oriented.  Hyperlipidemia -Continue with Atorvastatin 40 mg p.o. daily -Patient left AGAINST MEDICAL ADVICE even though the risks of leaving were discussed with him.  He is of sound mind making his own decisions and awake and alert and oriented.  CAD with history of occlusion of a small RCA with collaterals and history of CABG withMAZE -Continue with isosorbide mononitrate and atorvastatin -Left AGAINST MEDICAL ADVICE  Glaucoma -Continue with Latanoprost -Patient left AGAINST MEDICAL ADVICE even though the risks of leaving were discussed with him.  He is of sound mind making his own decisions and awake and alert and oriented.  Hyponatremia -Likely in the setting of suspected hypervolemic hyponatremia -Na+ went from 130 -> 128 -> 130 -> 129 -Continue monitor and trend sodium with diuresis -Repeat CMP in a.m. -May Need Nephrology involvement   Gout -Currently holding up Allopurinol will resume when kidney function function is improved -Left AGAINST MEDICAL ADVICE  Hypertension -Patient blood pressure was on the lower side -We will currently hold hydralazine 25 mg p.o. nightly but will continue isosorbide mononitrate -Holding home diuretics with metolazone and torsemide -Left AMA  Tobacco Abuse -Smoking cessation counseling given -Left AMA  Obesity -Estimated body mass index is 39.34 kg/m as calculated from the following:   Height as of this encounter: 5\' 5"  (1.651 m).   Weight as of this encounter: 107.2 kg. -Weight Loss Counseling given -Left AMA  Hyperbilirubinemia -Likely reactive -Patient's T bili was 1.5 and worsened to 2.1 -Continue monitor and trend and repeat CMP in a.m. -May require further workup if continues to worsen  -Left AMA  Discharge Instructions  Allergies as of 03/30/2018      Reactions   Pork-derived Products Other (See Comments)   Religious preference      Medication List    ASK your doctor about  these medications   ACCU-CHEK AVIVA device Use as instructed1 times daily before meals   accu-chek softclix lancets Use as instructed for once daily testing of blood sugar   ACCU-CHEK SOFTCLIX LANCETS lancets Use for once daily testing of blood sugar   acetaminophen-codeine 300-30 MG tablet Commonly known as:  TYLENOL #3 TAKE 1 TABLET BY MOUTH AT BEDTIME AS NEEDED FOR MODERATE PAIN.   albuterol (2.5 MG/3ML) 0.083% nebulizer solution Commonly known as:  PROVENTIL Take 3 mLs (2.5 mg total) by nebulization every 6 (six) hours as needed for wheezing or shortness of breath.   VENTOLIN HFA 108 (90 Base) MCG/ACT inhaler Generic drug:  albuterol INHALE 2 PUFFS INTO THE LUNGS EVERY 6 (SIX) HOURS AS NEEDED FOR WHEEZING OR SHORTNESS OF BREATH.   allopurinol 100 MG tablet Commonly known as:  ZYLOPRIM TAKE 2 TABLETS (200 MG TOTAL) BY MOUTH EVERY MORNING   atorvastatin 40 MG tablet Commonly known as:  LIPITOR TAKE 1 TABLET BY MOUTH EVERY EVENING AT 6 PM   cephALEXin 500 MG capsule Commonly known as:  KEFLEX Take  1 capsule (500 mg total) by mouth 2 (two) times daily.   cetirizine 10 MG tablet Commonly known as:  ZYRTEC TAKE 1 TABLET (10 MG TOTAL) BY MOUTH DAILY.   diclofenac sodium 1 % Gel Commonly known as:  VOLTAREN APPLY 4 GRAMS TOPICALLY 4 (FOUR) TIMES DAILY   digoxin 0.125 MG tablet Commonly known as:  LANOXIN Take 0.5 tablets (62.5 mcg total) by mouth daily.   ergocalciferol 1.25 MG (50000 UT) capsule Commonly known as:  DRISDOL Take 1 capsule (50,000 Units total) by mouth once a week.   gabapentin 300 MG capsule Commonly known as:  NEURONTIN Take 1 capsule (300 mg total) by mouth daily.   glipiZIDE 10 MG tablet Commonly known as:  GLUCOTROL Take 1 tablet (10 mg total) by mouth 2 (two) times daily before a meal.   glucose blood test strip Commonly known as:  ACCU-CHEK AVIVA Use as instructed for 1 times daily testing of blood sugar   hydrALAZINE 25 MG  tablet Commonly known as:  APRESOLINE TAKE 1 TABLET BY MOUTH THREE TIMES DAILY ( EVERY MORNING, NOON,EVENING )   Insulin Glargine 100 UNIT/ML Solostar Pen Commonly known as:  LANTUS Inject 48 Units into the skin daily at 10 pm.   isosorbide mononitrate 30 MG 24 hr tablet Commonly known as:  IMDUR Take 1 tablet (30 mg total) by mouth daily.   meclizine 25 MG tablet Commonly known as:  ANTIVERT TAKE 1 TABLET (25 MG TOTAL) BY MOUTH 3 (THREE) TIMES DAILY AS NEEDED FOR DIZZINESS.   metolazone 2.5 MG tablet Commonly known as:  ZAROXOLYN Take 1 tablet once as directed by CHF clinic.   Misc. Administrator, arts.  Diagnosis: CHF   potassium chloride SA 20 MEQ tablet Commonly known as:  K-DUR,KLOR-CON Take 2 tablets (40 mEq total) by mouth daily as needed. TAKE ONLY ON DAYS YOU TAKE METOLAZONE!!   spironolactone 25 MG tablet Commonly known as:  ALDACTONE Take 0.5 tablets (12.5 mg total) by mouth daily.   torsemide 20 MG tablet Commonly known as:  DEMADEX Take 4 tablets (80 mg total) by mouth 2 (two) times daily.   traZODone 100 MG tablet Commonly known as:  DESYREL Take 1 tablet (100 mg total) by mouth at bedtime as needed for sleep.   XALATAN 0.005 % ophthalmic solution Generic drug:  latanoprost Place 1 drop into both eyes daily.   XARELTO 20 MG Tabs tablet Generic drug:  rivaroxaban TAKE ONE TABLET BY MOUTH ONCE DAILY      Follow-up Information    [facility] Follow up.   Specialty:  Home Health Services Why:  Registered Nurse, Physical & Occupational Therapy, Social Worker Contact information: [address] [place] [address] [phone]          Allergies  Allergen Reactions  . Pork-Derived Products Other (See Comments)    Religious preference   Consultations:  Cardiology  Gastrotenterology   Procedures/Studies: Dg Chest 2 View  Result Date: 03/31/2018 CLINICAL DATA:  Chest pain for 1 day. EXAM: CHEST - 2 VIEW  COMPARISON:  Radiograph yesterday at 0629 hour, additional priors FINDINGS: Left-sided pacemaker in place. Post median sternotomy and CABG. Unchanged cardiomegaly, mild interstitial edema, and right pleural effusion. No new airspace opacity. No pneumothorax. IMPRESSION: Unchanged cardiomegaly, interstitial edema, and right pleural effusion over the past 4 days. No new abnormality. Electronically Signed   By: Narda Rutherford M.D.   On: 03/31/2018 19:44   Dg Chest 2 View  Result  Date: 03/28/2018 CLINICAL DATA:  Shortness of breath and left-sided chest pain 2 days. EXAM: CHEST - 2 VIEW COMPARISON:  03/27/2018 FINDINGS: Sternotomy wires and left-sided pacemaker unchanged. Lungs are hypoinflated and demonstrate a stable small to moderate right pleural effusion likely with associated basilar atelectasis. Infection in the right base is possible. There is mild prominence of the perihilar markings suggesting mild degree of vascular congestion. Mild stable cardiomegaly. Remainder of the exam is unchanged. IMPRESSION: Mild stable cardiomegaly with minimal vascular congestion and stable small to moderate right pleural effusion likely with associated basilar atelectasis. Infection in the right base is possible. Electronically Signed   By: Elberta Fortisaniel  Boyle M.D.   On: 03/28/2018 07:35   Dg Chest 2 View  Result Date: 03/27/2018 CLINICAL DATA:  Chest pain EXAM: CHEST - 2 VIEW COMPARISON:  09/12/2017 FINDINGS: Cardiac shadow remains enlarged. Defibrillator is again noted and stable as are postoperative changes. Increasing right-sided pleural effusion with likely underlying atelectasis is present. Mild central vascular congestion is noted as well. IMPRESSION: Mild central vascular congestion. Increasing right basilar effusion and likely underlying atelectasis. Electronically Signed   By: Alcide CleverMark  Lukens M.D.   On: 03/27/2018 12:02   Dg Chest Port 1 View  Result Date: 03/30/2018 CLINICAL DATA:  Shortness of breath. Previous CABG.  EXAM: PORTABLE CHEST 1 VIEW COMPARISON:  03/28/2018 FINDINGS: Previous median sternotomy and CABG. Pacemaker/AICD remains in place. Interstitial edema pattern persists. Right effusion persists with right base atelectasis. IMPRESSION: Persistent interstitial edema and right effusion with right base atelectasis. No worsening or new finding. Electronically Signed   By: Paulina FusiMark  Shogry M.D.   On: 03/30/2018 07:22    EGD  Findings: The larynx was normal. The examined esophagus was normal. Few non-bleeding small cratered and superficial gastric ulcers with  pigmented material were found on the greater curvature of the stomach,  on the lesser curvature of the stomach and in the gastric antrum.  Biopsies were taken with a cold forceps for histology of a few of the  ulcers and the antrum and the fundus to rule out Helicobacter. A single small probable angiodysplastic lesion without bleeding was  found in the second portion of the duodenum. The third portion of the duodenum was normal. The exam was otherwise without abnormality. One non-bleeding small superficial duodenal ulcer was found in the  duodenal bulb. Diffuse mild inflammation characterized by congestion (edema) was found  in the entire examined stomach compatible with probable portal  gastropathy from right heart failure. Impression: - Normal larynx. - Normal esophagus. - Non-bleeding small gastric ulcers with pigmented  material. Biopsied. - A single non-bleeding ? Small angiodysplastic  lesion in the duodenum. - Normal third portion of the duodenum. - The examination was otherwise normal. - One non-bleeding small duodenal  ulcer. - Chronic gastritis questionable portal gastropathy  from right heart failure.  ECHOCARDIOGRAM ------------------------------------------------------------------- Study Conclusions  - Left ventricle: The cavity size was mildly dilated. Wall thickness was normal. Systolic function was severely reduced. The estimated ejection fraction was in the range of 25% to 30%. Diffuse hypokinesis. The study was not technically sufficient to allow evaluation of LV diastolic dysfunction due to atrial fibrillation. - Aortic valve: Trileaflet; moderately calcified leaflets. There was no stenosis. There was trivial regurgitation. - Mitral valve: Moderately calcified annulus. There was no significant regurgitation. - Left atrium: The atrium was moderately dilated. - Right ventricle: The cavity size was normal. Pacer wire or catheter noted in right ventricle. Systolic function was moderately reduced. - Right atrium:  The atrium was mildly dilated. - Pulmonary arteries: No complete TR doppler jet so unable to estimate PA systolic pressure. - Systemic veins: IVC measured 2.7 cm with < 50% respirophasic variation, suggesting RA pressure 15 mmHg.  Impressions:  - The patient was in atrial fibrillation. Mildly dilated LV with EF 25-30%, diffuse hypokinesis. Normal RV size with moderately decreased systolic function. No significant valvular abnormalities. Dilated IVC suggestive of elevated RV filling pressure.  COLONOSCOPY not done yet   Subjective (Earlier from the day): Seen and examined at bedside prior to his colonoscopy and states that he was unable to drink his GoLYTELY because he states that he had 2 bowel movements last night and that was bothering him.  Wanted to sign AGAINST MEDICAL ADVICE this morning because he was not being fed.  I discussed with him the importance of him going under colonoscopy  find out if he has any other source of bleeding and he was agreeable.  Went for a colonoscopy and was not clean so will attempt again tomorrow.  Patient's main complaint is leg swelling and states that his shortness of breath is improved but still has some.  Also was concerned about his blood sugar being low.  No other concerns or complaints at this time **Decided to leave AGAINST MEDICAL ADVICE anyway and left prior to shift change   Discharge Exam: Vitals:   03/30/18 1400 03/30/18 1537  BP: 101/67 113/67  Pulse: 93 95  Resp: 15 15  Temp:  97.7 F (36.5 C)  SpO2: 99% 97%   Vitals:   03/30/18 1343 03/30/18 1350 03/30/18 1400 03/30/18 1537  BP: (!) 174/99 101/67 101/67 113/67  Pulse:  78 93 95  Resp: 17 (!) 21 15 15   Temp:    97.7 F (36.5 C)  TempSrc: Oral   Oral  SpO2: 94% 96% 99% 97%  Weight:      Height:       Examination (Done in the AM prior to leaving AMA) Physical Exam:  Constitutional: Well-nourished, well-developed obese male currently no acute distress appears agitated because he not get anything to eat Eyes: Lids and conjunctive are normal.  Sclera anicteric ENMT: External ears and nose appear normal.  Grossly normal hearing.  Mucous members are moist.  Has poor dentition Neck: Supple with some JVD Respiratory: Diminished auscultation bilaterally no appreciable wheezing, rales, rhonchi.  Does have some crackles and has a normal respiratory effort is not tachypneic Cardiovascular: Regularly irregular but rate controlled.  No appreciable murmurs, rubs, gallops.  Has  2+ lower extremity edema Abdomen: Soft, mildly tender.  Distended secondary body habitus.  Bowel sounds present GU: Deferred Musculoskeletal: Good range of motion is no contractures.  Normal strength and muscle tone.  No joint deformities Skin: No rashes or lesions on limited skin evaluation Neurologic: Cranial nerves II through XII grossly intact no appreciable focal deficits Psychiatric: Awake and alert  and somewhat agitated.  Normal judgment and insight  The results of significant diagnostics from this hospitalization (including imaging, microbiology, ancillary and laboratory) are listed below for reference.     Microbiology: No results found for this or any previous visit (from the past 240 hour(s)).   Labs: BNP (last 3 results) Recent Labs    05/28/17 1600 06/11/17 1358 03/27/18 1124  BNP 279.0* 225.4* 499.7*   Basic Metabolic Panel: Recent Labs  Lab 03/27/18 1124 03/27/18 1808 03/28/18 0546 03/29/18 0920 03/30/18 0449 03/31/18 1854  NA 130*  --  128* 130* 129* 131*  K  3.5  --  4.0 4.1 4.1 3.9  CL 96*  --  94* 93* 94* 95*  CO2 25  --  25 28 27 27   GLUCOSE 168*  --  190* 112* 61* 232*  BUN 37*  --  41* 46* 38* 27*  CREATININE 1.68*  --  1.73* 1.78* 1.51* 1.35*  CALCIUM 8.3*  --  8.1* 8.5* 8.3* 8.7*  MG  --  2.0 2.0 2.0 2.0  --   PHOS  --   --  3.1  --  3.6  --    Liver Function Tests: Recent Labs  Lab 03/28/18 0546 03/29/18 0920 03/30/18 0449  AST 24 24 22   ALT 15 18 17   ALKPHOS 195* 187* 180*  BILITOT 1.0 1.5* 2.1*  PROT 7.3 7.7 7.4  ALBUMIN 2.8* 2.8* 2.8*   No results for input(s): LIPASE, AMYLASE in the last 168 hours. No results for input(s): AMMONIA in the last 168 hours. CBC: Recent Labs  Lab 03/27/18 1124 03/28/18 0546 03/29/18 0920 03/30/18 0449 03/31/18 1854  WBC 6.4 7.8 7.6 7.5 7.9  NEUTROABS 5.4 5.9 5.7 6.7  --   HGB 5.3* 6.9* 7.3* 8.0* 8.2*  HCT 21.3* 24.3* 26.8* 28.7* 30.3*  MCV 66.6* 68.1* 69.3* 71.2* 73.2*  PLT 244 254 256 226 229   Cardiac Enzymes: Recent Labs  Lab 03/27/18 1808 03/27/18 2342 03/28/18 0546  TROPONINI 0.04* 0.04* 0.04*   BNP: Invalid input(s): POCBNP CBG: Recent Labs  Lab 03/30/18 0857 03/30/18 1125 03/30/18 1311 03/30/18 1334 03/30/18 1731  GLUCAP 95 75 49* 160* 177*   D-Dimer No results for input(s): DDIMER in the last 72 hours. Hgb A1c No results for input(s): HGBA1C in the last 72  hours. Lipid Profile No results for input(s): CHOL, HDL, LDLCALC, TRIG, CHOLHDL, LDLDIRECT in the last 72 hours. Thyroid function studies No results for input(s): TSH, T4TOTAL, T3FREE, THYROIDAB in the last 72 hours.  Invalid input(s): FREET3 Anemia work up No results for input(s): VITAMINB12, FOLATE, FERRITIN, TIBC, IRON, RETICCTPCT in the last 72 hours. Urinalysis    Component Value Date/Time   COLORURINE YELLOW 03/27/2018 1520   APPEARANCEUR CLEAR 03/27/2018 1520   LABSPEC 1.006 03/27/2018 1520   PHURINE 5.0 03/27/2018 1520   GLUCOSEU NEGATIVE 03/27/2018 1520   HGBUR NEGATIVE 03/27/2018 1520   BILIRUBINUR NEGATIVE 03/27/2018 1520   KETONESUR NEGATIVE 03/27/2018 1520   PROTEINUR NEGATIVE 03/27/2018 1520   UROBILINOGEN 1.0 12/01/2014 1300   NITRITE NEGATIVE 03/27/2018 1520   LEUKOCYTESUR NEGATIVE 03/27/2018 1520   Sepsis Labs Invalid input(s): PROCALCITONIN,  WBC,  LACTICIDVEN Microbiology No results found for this or any previous visit (from the past 240 hour(s)).  Time coordinating discharge: 45 minutes for care counseling and coordination as well as discharge education and discussion with the patient about the risks of leaving AGAINST MEDICAL ADVICE   SIGNED:  Merlene LaughterOmair Latif Sheikh, DO [facility] 03/31/2018, 10:31 PM Pager is on AMION  If 7PM-7AM, please contact night-coverage [email or web address] Password [email or web address]

## 2018-03-31 NOTE — ED Notes (Signed)
Patient transported to X-ray 

## 2018-03-31 NOTE — ED Triage Notes (Signed)
Pt BIB [facility] for eval of CP onset this AM. Pt was admitted from1/3-1/6. Left AMA last night after unsuccessful colonoscopy. Pt reports chest pain and shortness of breath started this AM and lasted throughout the day. Pt received 1 SL NTG by EMS w/ no change, 324 ASA. Pt appears tachypneic on arrival, hx of CHF. Pt is poor historian.

## 2018-03-31 NOTE — ED Notes (Signed)
Pt returned from X-ray.  

## 2018-03-31 NOTE — Telephone Encounter (Signed)
I spoke with Erskine Squibb from [employer]. She advised me that pt left the hospital yesterday AMA.  He was scheduled to get a coloscopy but left the hospital/unknown the reason.  I will f/u with pt later today or tomorrow and call jane back with any further information.

## 2018-03-31 NOTE — ED Notes (Signed)
Ambulated pt on SpO2, pts oxygen level stayed at 100%. Notified Dr. Particia Nearing

## 2018-03-31 NOTE — ED Notes (Signed)
Pt called out to use the bathroom, this NT was given report that pt's is very weak and was given a urinal to urinate in. Pt on lasix, this NT notified pt that we are going to avoid standing up right now due to the pt being very weak. We can continue to use the urinal for right now, pt denied and was very adamant about ambulating to the restroom. This NT notified pt we have policies for pt being a fall risk and I Tyffani Foglesong(NT) offered pt a urinal. Pt became very agitated and told this NT to tell the RN he wanted to leave. This NT notified Julie(RN) and Dr. Particia Nearing. Dr. Particia Nearing acknowledged and talk to the pt.

## 2018-03-31 NOTE — ED Provider Notes (Signed)
[facility] EMERGENCY DEPARTMENT Provider Note   CSN: [id number] Arrival date & time: 03/31/18  1847     History   Chief Complaint Chief Complaint  Patient presents with  . Chest Pain    HPI Jacob [name] is a 63 y.o. male.  Pt presents to the ED today with CP and SOB.  The pt was admitted on 1/3 for cp and anemia.  Pt has a hx of CAD and afib.  He is on Xarelto due to the afib.  His hgb was found to be 5.3.  Dr. Matthias Hughs from [facility] attempted to do a colonoscopy on him yesterday.  Unfortunately, the colon prep was inadequate and he had a large amount of stool in his colon.  Colonoscopy unable to be performed.  He signed himself out AMA yesterday.  The case manager called him today and he was still not feeling well, so she told him to call 911.  So, he did.  Cardiac work up unremarkable in the hospital.  CHA2DS2/VAS Stroke Risk Points  Current as of just now     4 >= 2 Points: High Risk  1 - 1.99 Points: Medium Risk  0 Points: Low Risk    This is the only CHA2DS2/VAS Stroke Risk Points available for the past  year.:  Last Change: N/A     Details    This score determines the patient's risk of having a stroke if the  patient has atrial fibrillation.       Points Metrics  1 Has Congestive Heart Failure:  Yes    Current as of just now  1 Has Vascular Disease:  Yes    Current as of just now  1 Has Hypertension:  Yes    Current as of just now  0 Age:  29    Current as of just now  1 Has Diabetes:  Yes    Current as of just now  0 Had Stroke:  No  Had TIA:  No  Had thromboembolism:  No    Current as of just now  0 Male:  No    Current as of just now              Past Medical History:  Diagnosis Date  . Atrial fibrillation (HCC)    RVR 10/2014  . Automatic implantable cardioverter-defibrillator in situ   . CAD (coronary artery disease) Sept 2013   s/p cardiac cath showing occlusion of small RCA with collaterals  . CHF (congestive heart failure)  (HCC)    20 to 25 % EF and RV dysfunction by 07/2014 echo   . Chronic anticoagulation    on xarelto.   . High cholesterol   . Hypertension   . Myocardial infarction (HCC) 2014  . Noncompliance    homelessness contributing.   . Peripheral arterial disease (HCC)   . Type II diabetes mellitus [facility])     Patient Active Problem List   Diagnosis Date Noted  . Hypokalemia   . Vitamin D deficiency 07/07/2017  . Exertional angina (HCC)   . Chronic systolic heart failure (HCC) 12/17/2016  . Hyperglycemia 12/02/2016  . Osteoarthritis of right knee 12/02/2016  . Congestive heart disease (HCC) 01/01/2016  . Diabetic neuropathy (HCC) 11/28/2015  . Ascites 11/09/2015  . CHF (congestive heart failure) (HCC) 11/09/2015  . Insomnia 04/18/2015  . Claudication of right lower extremity (HCC) 04/07/2015  . Cardiomyopathy, ischemic 04/07/2015  . Chronic anticoagulation 03/30/2015  . Cardiorenal syndrome with  renal failure   . SOB (shortness of breath)   . Morbid obesity due to excess calories (HCC)   . Helicobacter pylori ab+ 12/12/2014  . Calf pain   . Duodenal ulcer with hemorrhage   . Hemorrhagic shock (HCC)   . Diabetes mellitus (HCC)   . Coronary artery disease involving native coronary artery of native heart without angina pectoris   . Atrial fibrillation with rapid ventricular response (HCC) 10/29/2014  . Chest pain   . Atherosclerosis of native arteries of extremity with intermittent claudication (HCC) 09/06/2014  . Atrial fibrillation with controlled ventricular response (HCC) 08/03/2014  . Bacteremia   . DM (diabetes mellitus), type 2 with peripheral vascular complications (HCC) 08/01/2014  . Anemia of chronic disease 08/01/2014  . PVD (peripheral vascular disease) (HCC) 10/29/2013  . Tobacco abuse 05/13/2013  . AF (atrial fibrillation) (HCC) 02/11/2013  . ICD - in place- BS [facility] 02/11/2013  . Microcytic anemia 01/11/2013  . CAD- s/p CABG [facility]  12/04/2011  . Noncompliance 12/01/2011  . Essential hypertension 10/30/2006    Past Surgical History:  Procedure Laterality Date  . BIOPSY  03/29/2018   Procedure: BIOPSY;  Surgeon: Vida RiggerMagod, Marc, MD;  [facility];  Service: Endoscopy;;  . CARDIAC CATHETERIZATION  09/2012  . CORONARY ANGIOPLASTY WITH STENT PLACEMENT  11/2011   "1"  . CORONARY ARTERY BYPASS GRAFT  09/2012   2 vessels per patient [name]([facility])   . ESOPHAGOGASTRODUODENOSCOPY N/A 11/28/2014   Procedure: ESOPHAGOGASTRODUODENOSCOPY (EGD);  Surgeon: Beverley FiedlerJay M Pyrtle, MD;  [facility];  Service: Endoscopy;  Laterality: N/A;  . ESOPHAGOGASTRODUODENOSCOPY (EGD) WITH PROPOFOL N/A 03/29/2018   Procedure: ESOPHAGOGASTRODUODENOSCOPY (EGD) WITH PROPOFOL;  Surgeon: Vida RiggerMagod, Marc, MD;  [facility];  Service: Endoscopy;  Laterality: N/A;  . ILIAC ARTERY STENT Right 08/30/2013  . IMPLANTABLE CARDIOVERTER DEFIBRILLATOR IMPLANT     [place] in 07/2012; [employer]  . LEFT AND RIGHT HEART CATHETERIZATION WITH CORONARY ANGIOGRAM N/A 12/02/2011   Procedure: LEFT AND RIGHT HEART CATHETERIZATION WITH CORONARY ANGIOGRAM;  Surgeon: Kathleene Hazelhristopher D McAlhany, MD;  [facility];  Service: Cardiovascular;  Laterality: N/A;  . LOWER EXTREMITY ANGIOGRAM N/A 08/30/2013   Procedure: LOWER EXTREMITY ANGIOGRAM;  Surgeon: Runell GessJonathan J Berry, MD;  [facility];  Service: Cardiovascular;  Laterality: N/A;  . LOWER EXTREMITY ANGIOGRAM N/A 12/02/2013   Procedure: LOWER EXTREMITY ANGIOGRAM;  Surgeon: Runell GessJonathan J Berry, MD;  [facility];  Service: Cardiovascular;  Laterality: N/A;  . RIGHT/LEFT HEART CATH AND CORONARY/GRAFT ANGIOGRAPHY N/A 04/23/2017   Procedure: RIGHT/LEFT HEART CATH AND CORONARY/GRAFT ANGIOGRAPHY;  Surgeon: Dolores PattyBensimhon, Daniel R, MD;  Location: [facility] INVASIVE CV LAB;  Service: Cardiovascular;  Laterality: N/A;        Home Medications    Prior to Admission medications   Medication Sig Start Date End Date Taking?  Authorizing Provider  allopurinol (ZYLOPRIM) 100 MG tablet TAKE 2 TABLETS (200 MG TOTAL) BY MOUTH EVERY MORNING Patient taking differently: Take 200 mg by mouth daily.  03/19/18  Yes Newlin, Odette HornsEnobong, MD  atorvastatin (LIPITOR) 40 MG tablet TAKE 1 TABLET BY MOUTH EVERY EVENING AT 6 PM Patient taking differently: Take 40 mg by mouth daily at 6 PM.  03/19/18  Yes Newlin, Enobong, MD  cephALEXin (KEFLEX) 500 MG capsule Take 1 capsule (500 mg total) by mouth 2 (two) times daily. 03/19/18  Yes [name], Enobong, MD  cetirizine (ZYRTEC) 10 MG tablet TAKE 1 TABLET (10 MG TOTAL) BY MOUTH DAILY. 02/06/18  Yes Hoy Register, MD  digoxin (LANOXIN) 0.125 MG tablet Take 0.5 tablets (62.5 mcg total) by mouth daily. 03/19/18  Yes Hoy Register, MD  gabapentin (NEURONTIN) 300 MG capsule Take 1 capsule (300 mg total) by mouth daily. 03/19/18  Yes Hoy Register, MD  glipiZIDE (GLUCOTROL) 10 MG tablet Take 1 tablet (10 mg total) by mouth 2 (two) times daily before a meal. 03/19/18  Yes Newlin, Enobong, MD  hydrALAZINE (APRESOLINE) 25 MG tablet TAKE 1 TABLET BY MOUTH THREE TIMES DAILY ( EVERY MORNING, NOON,EVENING ) Patient taking differently: Take 25 mg by mouth 3 (three) times daily. ( EVERY MORNING, NOON,EVENING ) 03/19/18  Yes Hoy Register, MD  Insulin Glargine (LANTUS) 100 UNIT/ML Solostar Pen Inject 48 Units into the skin daily at 10 pm. Patient taking differently: Inject 50 Units into the skin daily at 10 pm.  03/19/18  Yes Hoy Register, MD  isosorbide mononitrate (IMDUR) 30 MG 24 hr tablet Take 1 tablet (30 mg total) by mouth daily. 03/19/18  Yes Hoy Register, MD  meclizine (ANTIVERT) 25 MG tablet TAKE 1 TABLET (25 MG TOTAL) BY MOUTH 3 (THREE) TIMES DAILY AS NEEDED FOR DIZZINESS. 02/06/18  Yes Newlin, Enobong, MD  metolazone (ZAROXOLYN) 2.5 MG tablet Take 1 tablet once as directed by CHF clinic. Patient taking differently: Take 2.5 mg by mouth 2 (two) times a week. Monday and Friday 03/19/18  Yes  Hoy Register, MD  potassium chloride SA (K-DUR,KLOR-CON) 20 MEQ tablet Take 2 tablets (40 mEq total) by mouth daily as needed. TAKE ONLY ON DAYS YOU TAKE METOLAZONE!! Patient taking differently: Take 20 mEq by mouth 2 (two) times a week. TAKE ONLY ON DAYS YOU TAKE METOLAZONE!! Monday and Friday 09/30/17  Yes Alford Highland, NP  spironolactone (ALDACTONE) 25 MG tablet Take 0.5 tablets (12.5 mg total) by mouth daily. 03/19/18  Yes Hoy Register, MD  torsemide (DEMADEX) 20 MG tablet Take 4 tablets (80 mg total) by mouth 2 (two) times daily. 03/19/18  Yes Hoy Register, MD  traZODone (DESYREL) 100 MG tablet Take 1 tablet (100 mg total) by mouth at bedtime as needed for sleep. 03/19/18  Yes Newlin, Enobong, MD  VENTOLIN HFA 108 (90 Base) MCG/ACT inhaler INHALE 2 PUFFS INTO THE LUNGS EVERY 6 (SIX) HOURS AS NEEDED FOR WHEEZING OR SHORTNESS OF BREATH. Patient taking differently: Inhale 2 puffs into the lungs every 6 (six) hours as needed for wheezing or shortness of breath.  03/04/18  Yes Newlin, Enobong, MD  XARELTO 20 MG TABS tablet TAKE ONE TABLET BY MOUTH ONCE DAILY Patient taking differently: Take 20 mg by mouth daily with supper.  03/04/18  Yes Hoy Register, MD  ACCU-CHEK SOFTCLIX LANCETS lancets Use for once daily testing of blood sugar 06/02/17   Newlin, Odette Horns, MD  acetaminophen-codeine (TYLENOL #3) 300-30 MG tablet TAKE 1 TABLET BY MOUTH AT BEDTIME AS NEEDED FOR MODERATE PAIN. Patient not taking: Reported on 03/27/2018 09/09/17   Hoy Register, MD  albuterol (PROVENTIL) (2.5 MG/3ML) 0.083% nebulizer solution Take 3 mLs (2.5 mg total) by nebulization every 6 (six) hours as needed for wheezing or shortness of breath. Patient not taking: Reported on 03/27/2018 04/18/15   Hoy Register, MD  Blood Glucose Monitoring Suppl (ACCU-CHEK AVIVA) device Use as instructed1 times daily before meals 06/02/17   Hoy Register, MD  diclofenac sodium (VOLTAREN) 1 % GEL APPLY 4 GRAMS TOPICALLY 4 (FOUR) TIMES  DAILY Patient not taking: Reported on 03/27/2018 11/17/17   Hoy Register, MD  ergocalciferol (DRISDOL) 50000 units capsule  Take 1 capsule (50,000 Units total) by mouth once a week. Patient not taking: Reported on 03/19/2018 06/03/17   Hoy Register, MD  glucose blood (ACCU-CHEK AVIVA) test strip Use as instructed for 1 times daily testing of blood sugar 06/02/17   Hoy Register, MD  Lancet Devices [facility]) lancets Use as instructed for once daily testing of blood sugar 11/28/15   Newlin, Enobong, MD  latanoprost (XALATAN) 0.005 % ophthalmic solution Place 1 drop into both eyes daily. 01/26/18   [provider]  Misc. Devices MISC Shower chair.  Diagnosis: CHF 02/18/18   Hoy Register, MD    Family History Family History  Problem Relation Age of Onset  . Diabetes Mother     Social History Social History   Tobacco Use  . Smoking status: Current Every Day Smoker    Packs/day: 0.50    Years: 40.00    Pack years: 20.00    Types: Cigarettes  . Smokeless tobacco: Never Used  Substance Use Topics  . Alcohol use: No    Alcohol/week: 0.0 standard drinks  . Drug use: No     Allergies   Pork-derived products   Review of Systems Review of Systems  Constitutional: Positive for fatigue.  Cardiovascular: Positive for chest pain.  All other systems reviewed and are negative.    Physical Exam Updated Vital Signs BP 137/71   Pulse 96   Temp 97.7 F (36.5 C) (Oral)   Resp 19   Ht 5\' 5"  (1.651 m)   Wt 107 kg   SpO2 98%   BMI 39.25 kg/m   Physical Exam Vitals signs and nursing note reviewed.  Constitutional:      Appearance: He is well-developed. He is obese.  HENT:     Head: Normocephalic and atraumatic.  Eyes:     Extraocular Movements: Extraocular movements intact.     Pupils: Pupils are equal, round, and reactive to light.  Neck:     Musculoskeletal: Normal range of motion and neck supple.  Cardiovascular:     Rate and Rhythm: Normal rate.  Rhythm irregular.     Heart sounds: Normal heart sounds.  Pulmonary:     Effort: Pulmonary effort is normal.     Breath sounds: Normal breath sounds.  Abdominal:     General: Bowel sounds are normal.     Palpations: Abdomen is soft.  Musculoskeletal:     Right lower leg: Edema present.     Left lower leg: Edema present.  Skin:    General: Skin is warm.     Capillary Refill: Capillary refill takes less than 2 seconds.  Neurological:     General: No focal deficit present.     Mental Status: He is alert and oriented to person, place, and time.  Psychiatric:        Mood and Affect: Mood normal.        Behavior: Behavior normal.      ED Treatments / Results  Labs (all labs ordered are listed, but only abnormal results are displayed) Labs Reviewed  BASIC METABOLIC PANEL - Abnormal; Notable for the following components:      Result Value   Sodium 131 (*)    Chloride 95 (*)    Glucose, Bld 232 (*)    BUN 27 (*)    Creatinine, Ser 1.35 (*)    Calcium 8.7 (*)    GFR calc non Af Amer 56 (*)    All other components within normal limits  CBC -  Abnormal; Notable for the following components:   RBC 4.14 (*)    Hemoglobin 8.2 (*)    HCT 30.3 (*)    MCV 73.2 (*)    MCH 19.8 (*)    MCHC 27.1 (*)    RDW 27.9 (*)    All other components within normal limits  I-STAT TROPONIN, ED  TYPE AND SCREEN    EKG EKG Interpretation  Date/Time:  Tuesday March 31 2018 18:52:26 EST Ventricular Rate:  98 PR Interval:    QRS Duration: 107 QT Interval:  361 QTC Calculation: 461 R Axis:   -110 Text Interpretation:  Atrial fibrillation Ventricular premature complex Left anterior fascicular block Low voltage, extremity leads Probable anteroseptal infarct, old No significant change since last tracing Confirmed by Jacalyn Lefevre [phone]) on 03/31/2018 7:52:50 PM   Radiology Dg Chest 2 View  Result Date: 03/31/2018 CLINICAL DATA:  Chest pain for 1 day. EXAM: CHEST - 2 VIEW COMPARISON:   Radiograph yesterday at 0629 hour, additional priors FINDINGS: Left-sided pacemaker in place. Post median sternotomy and CABG. Unchanged cardiomegaly, mild interstitial edema, and right pleural effusion. No new airspace opacity. No pneumothorax. IMPRESSION: Unchanged cardiomegaly, interstitial edema, and right pleural effusion over the past 4 days. No new abnormality. Electronically Signed   By: Narda Rutherford M.D.   On: 03/31/2018 19:44   Dg Chest Port 1 View  Result Date: 03/30/2018 CLINICAL DATA:  Shortness of breath. Previous CABG. EXAM: PORTABLE CHEST 1 VIEW COMPARISON:  03/28/2018 FINDINGS: Previous median sternotomy and CABG. Pacemaker/AICD remains in place. Interstitial edema pattern persists. Right effusion persists with right base atelectasis. IMPRESSION: Persistent interstitial edema and right effusion with right base atelectasis. No worsening or new finding. Electronically Signed   By: Paulina Fusi M.D.   On: 03/30/2018 07:22    Procedures Procedures (including critical care time)  Medications Ordered in ED Medications  furosemide (LASIX) injection 40 mg (40 mg Intravenous Given 03/31/18 2005)     Initial Impression / Assessment and Plan / ED Course  I have reviewed the triage vital signs and the nursing notes.  Pertinent labs & imaging results that were available during my care of the patient were reviewed by me and considered in my medical decision making (see chart for details).    Hemoglobin is stable from yesterday.  2 troponins are negative.  EKG unchanged.  Afib is rate controlled.  CXR unchanged.   Pt is able to ambulate without any problems.  O2 sats 100% with ambulation.  He is encouraged to stop smoking.  He is to f/u with pcp and with GI.  Continue to hold Xarelto until seen by PCP.  Return if worse.  Final Clinical Impressions(s) / ED Diagnoses   Final diagnoses:  Atypical chest pain  Tobacco abuse  Anemia, unspecified type  Atrial fibrillation with controlled  ventricular rate [facility])    ED Discharge Orders    None       Jacalyn Lefevre, MD 03/31/18 2118

## 2018-04-01 ENCOUNTER — Telehealth: Payer: Self-pay

## 2018-04-01 NOTE — Telephone Encounter (Signed)
Noted  

## 2018-04-01 NOTE — Telephone Encounter (Signed)
Call placed to the patient with Ranee Gosselin, [employer] [place] present for call. She was inquiring if the patient had a decision from social security about his SSI.  He said that he has a letter but could explain what the letter said. Informed him that this CM would let [facility] Copper, EMT know about the letter and ask that she review it for him. He said that he went to the ED last night and they sent him home stating he was " fine."  He reported no problems today but stated that he understood that he would need to return to the hospital if he develops any chest pain/ shortness of breath.  He noted that he has all of his medications and his PCS aide was with him.  She sees him for 2 hours every day. He said that he had not checked his BP yet today.  He was agreeable to scheduling an appointment with Dr Alvis Lemmings and the appointment was scheduled for 04/06/2018 @ 1050.  He said that he would need cab transportation.  [place], [facility] office team lead notified of the need for cab.  He has the phone number for [facility] should he have any questions. .   Call placed to [facility] Copper, EMT and notified her of the patient's ED visit yesterday evening and he is now home.  Requested that she ask him to review the letter from social security when she sees him again.

## 2018-04-02 ENCOUNTER — Other Ambulatory Visit (HOSPITAL_COMMUNITY): Payer: Self-pay

## 2018-04-02 ENCOUNTER — Telehealth (HOSPITAL_COMMUNITY): Payer: Self-pay

## 2018-04-02 NOTE — Telephone Encounter (Signed)
Pt called to confirm today's [facility] visit @ 9am.

## 2018-04-02 NOTE — Progress Notes (Signed)
Paramedicine Encounter    Patient ID: Jacob [name], male    DOB: Apr 02, 1955, 62 y.o.   MRN: [id number]    Patient Care Team: Hoy Register, MD as PCP - General (Family Medicine) Bensimhon, Bevelyn Buckles, MD as PCP - Cardiology (Cardiology) Marinus Maw, MD as PCP - Electrophysiology (Cardiology) Clarisa Schools, RN as Registered Nurse Rourk, Gerrit Friends, MD as Consulting Physician (Gastroenterology) Pleasant, Dennard Schaumann, RN as [facility] Care Management Uris, Wyn Quaker, LCSW as Social Worker (Licensed Clinical Social Worker)  Patient Active Problem List   Diagnosis Date Noted  . Anemia requiring transfusions   . Hypokalemia   . Vitamin D deficiency 07/07/2017  . Exertional angina (HCC)   . Chronic systolic heart failure (HCC) 12/17/2016  . Hyperglycemia 12/02/2016  . Osteoarthritis of right knee 12/02/2016  . Congestive heart disease (HCC) 01/01/2016  . Diabetic neuropathy (HCC) 11/28/2015  . Ascites 11/09/2015  . CHF (congestive heart failure) (HCC) 11/09/2015  . Insomnia 04/18/2015  . Claudication of right lower extremity (HCC) 04/07/2015  . Cardiomyopathy, ischemic 04/07/2015  . Chronic anticoagulation 03/30/2015  . Cardiorenal syndrome with renal failure   . SOB (shortness of breath)   . Morbid obesity due to excess calories (HCC)   . Helicobacter pylori ab+ 12/12/2014  . Calf pain   . Duodenal ulcer with hemorrhage   . Hemorrhagic shock (HCC)   . Diabetes mellitus (HCC)   . Coronary artery disease involving native coronary artery of native heart without angina pectoris   . Atrial fibrillation with rapid ventricular response (HCC) 10/29/2014  . Chest pain   . Atherosclerosis of native arteries of extremity with intermittent claudication (HCC) 09/06/2014  . Atrial fibrillation with controlled ventricular response (HCC) 08/03/2014  . Bacteremia   . DM (diabetes mellitus), type 2 with peripheral vascular complications (HCC) 08/01/2014  . Anemia of chronic disease  08/01/2014  . PVD (peripheral vascular disease) (HCC) 10/29/2013  . Tobacco abuse 05/13/2013  . AF (atrial fibrillation) (HCC) 02/11/2013  . ICD - in place- BS May 2014 [facility] 02/11/2013  . Microcytic anemia 01/11/2013  . CAD- s/p CABG July 2014 [facility] 12/04/2011  . Noncompliance 12/01/2011  . Essential hypertension 10/30/2006    Current Outpatient Medications:  .  ACCU-CHEK SOFTCLIX LANCETS lancets, Use for once daily testing of blood sugar, Disp: 100 each, Rfl: 12 .  acetaminophen-codeine (TYLENOL #3) 300-30 MG tablet, TAKE 1 TABLET BY MOUTH AT BEDTIME AS NEEDED FOR MODERATE PAIN. (Patient not taking: Reported on 03/27/2018), Disp: 30 tablet, Rfl: 0 .  albuterol (PROVENTIL) (2.5 MG/3ML) 0.083% nebulizer solution, Take 3 mLs (2.5 mg total) by nebulization every 6 (six) hours as needed for wheezing or shortness of breath. (Patient not taking: Reported on 03/27/2018), Disp: 150 mL, Rfl: 1 .  allopurinol (ZYLOPRIM) 100 MG tablet, TAKE 2 TABLETS (200 MG TOTAL) BY MOUTH EVERY MORNING (Patient taking differently: Take 200 mg by mouth daily. ), Disp: 60 tablet, Rfl: 3 .  atorvastatin (LIPITOR) 40 MG tablet, TAKE 1 TABLET BY MOUTH EVERY EVENING AT 6 PM (Patient taking differently: Take 40 mg by mouth daily at 6 PM. ), Disp: 30 tablet, Rfl: 3 .  Blood Glucose Monitoring Suppl (ACCU-CHEK AVIVA) device, Use as instructed1 times daily before meals, Disp: 1 each, Rfl: 0 .  cephALEXin (KEFLEX) 500 MG capsule, Take 1 capsule (500 mg total) by mouth 2 (two) times daily., Disp: 20 capsule, Rfl: 0 .  cetirizine (ZYRTEC) 10 MG tablet, TAKE 1 TABLET (10 MG  TOTAL) BY MOUTH DAILY., Disp: 30 tablet, Rfl: 2 .  diclofenac sodium (VOLTAREN) 1 % GEL, APPLY 4 GRAMS TOPICALLY 4 (FOUR) TIMES DAILY (Patient not taking: Reported on 03/27/2018), Disp: 100 g, Rfl: 0 .  digoxin (LANOXIN) 0.125 MG tablet, Take 0.5 tablets (62.5 mcg total) by mouth daily., Disp: 14 tablet, Rfl: 3 .  ergocalciferol (DRISDOL) 50000 units  capsule, Take 1 capsule (50,000 Units total) by mouth once a week. (Patient not taking: Reported on 03/19/2018), Disp: 4 capsule, Rfl: 1 .  gabapentin (NEURONTIN) 300 MG capsule, Take 1 capsule (300 mg total) by mouth daily., Disp: 30 capsule, Rfl: 3 .  glipiZIDE (GLUCOTROL) 10 MG tablet, Take 1 tablet (10 mg total) by mouth 2 (two) times daily before a meal., Disp: 60 tablet, Rfl: 5 .  glucose blood (ACCU-CHEK AVIVA) test strip, Use as instructed for 1 times daily testing of blood sugar, Disp: 100 each, Rfl: 12 .  hydrALAZINE (APRESOLINE) 25 MG tablet, TAKE 1 TABLET BY MOUTH THREE TIMES DAILY ( EVERY MORNING, NOON,EVENING ) (Patient taking differently: Take 25 mg by mouth 3 (three) times daily. ( EVERY MORNING, NOON,EVENING )), Disp: 90 tablet, Rfl: 3 .  Insulin Glargine (LANTUS) 100 UNIT/ML Solostar Pen, Inject 48 Units into the skin daily at 10 pm. (Patient taking differently: Inject 50 Units into the skin daily at 10 pm. ), Disp: 5 pen, Rfl: 5 .  isosorbide mononitrate (IMDUR) 30 MG 24 hr tablet, Take 1 tablet (30 mg total) by mouth daily., Disp: 30 tablet, Rfl: 3 .  Lancet Devices (ACCU-CHEK SOFTCLIX) lancets, Use as instructed for once daily testing of blood sugar, Disp: 1 each, Rfl: 0 .  latanoprost (XALATAN) 0.005 % ophthalmic solution, Place 1 drop into both eyes daily., Disp: , Rfl:  .  meclizine (ANTIVERT) 25 MG tablet, TAKE 1 TABLET (25 MG TOTAL) BY MOUTH 3 (THREE) TIMES DAILY AS NEEDED FOR DIZZINESS., Disp: 60 tablet, Rfl: 0 .  metolazone (ZAROXOLYN) 2.5 MG tablet, Take 1 tablet once as directed by CHF clinic. (Patient taking differently: Take 2.5 mg by mouth 2 (two) times a week. Monday and Friday), Disp: 10 tablet, Rfl: 0 .  Misc. Devices MISC, Paediatric nurse.  Diagnosis: CHF, Disp: 1 each, Rfl: 0 .  potassium chloride SA (K-DUR,KLOR-CON) 20 MEQ tablet, Take 2 tablets (40 mEq total) by mouth daily as needed. TAKE ONLY ON DAYS YOU TAKE METOLAZONE!! (Patient taking differently: Take 20 mEq by  mouth 2 (two) times a week. TAKE ONLY ON DAYS YOU TAKE METOLAZONE!! Monday and Friday), Disp: 60 tablet, Rfl: 11 .  spironolactone (ALDACTONE) 25 MG tablet, Take 0.5 tablets (12.5 mg total) by mouth daily., Disp: 30 tablet, Rfl: 3 .  torsemide (DEMADEX) 20 MG tablet, Take 4 tablets (80 mg total) by mouth 2 (two) times daily., Disp: 240 tablet, Rfl: 3 .  traZODone (DESYREL) 100 MG tablet, Take 1 tablet (100 mg total) by mouth at bedtime as needed for sleep., Disp: 30 tablet, Rfl: 3 .  VENTOLIN HFA 108 (90 Base) MCG/ACT inhaler, INHALE 2 PUFFS INTO THE LUNGS EVERY 6 (SIX) HOURS AS NEEDED FOR WHEEZING OR SHORTNESS OF BREATH. (Patient taking differently: Inhale 2 puffs into the lungs every 6 (six) hours as needed for wheezing or shortness of breath. ), Disp: 18 g, Rfl: 0 .  XARELTO 20 MG TABS tablet, TAKE ONE TABLET BY MOUTH ONCE DAILY (Patient taking differently: Take 20 mg by mouth daily with supper. ), Disp: 30 tablet, Rfl: 0 Allergies  Allergen Reactions  .  Pork-Derived Products Other (See Comments)    Religious preference     Social History   Socioeconomic History  . Marital status: Divorced    Spouse name: Not on file  . Number of children: 3  . Years of education: Not on file  . Highest education level: Not on file  Occupational History  . Occupation: disabled  Social Needs  . Financial resource strain: Not on file  . Food insecurity:    Worry: Not on file    Inability: Not on file  . Transportation needs:    Medical: Not on file    Non-medical: Not on file  Tobacco Use  . Smoking status: Current Every Day Smoker    Packs/day: 0.50    Years: 40.00    Pack years: 20.00    Types: Cigarettes  . Smokeless tobacco: Never Used  Substance and Sexual Activity  . Alcohol use: No    Alcohol/week: 0.0 standard drinks  . Drug use: No  . Sexual activity: Not Currently  Lifestyle  . Physical activity:    Days per week: Not on file    Minutes per session: Not on file  . Stress: Not  on file  Relationships  . Social connections:    Talks on phone: Not on file    Gets together: Not on file    Attends religious service: Not on file    Active member of club or organization: Not on file    Attends meetings of clubs or organizations: Not on file    Relationship status: Not on file  . Intimate partner violence:    Fear of current or ex partner: Not on file    Emotionally abused: Not on file    Physically abused: Not on file    Forced sexual activity: Not on file  Other Topics Concern  . Not on file  Social History Narrative   Has an apartment with a roommate. He was living on the streets in 2013-02-09.  He reports that his father died in [place] in February 09, 2013.  He is divorced.  He is no longer estranged from his son, but still from his daughter who lives locally.  Neither of his parents, nor any siblings have any history of CAD.    Physical Exam Pulmonary:     Effort: Pulmonary effort is normal.     Breath sounds: No wheezing.  Abdominal:     Tenderness: There is no abdominal tenderness. There is no guarding.  Musculoskeletal:     Right lower leg: Edema present.     Left lower leg: Edema present.  Skin:    General: Skin is warm and dry.         Future Appointments  Date Time Provider Department Center  04/06/2018 10:50 AM Hoy Register, MD [facility] None  05/28/2018  2:30 PM [facility] PA/NP [facility] None     BP 108/60 (BP Location: Right Arm, Patient Position: Sitting, Cuff Size: Normal)   Pulse (!) 103   Resp 16   Wt 228 lb (103.4 kg)   SpO2 95%   BMI 37.94 kg/m  cbg 214  Weight yesterday-didn't weigh Last visit weight-222 (12/19)  ATF pt CAO x4 sitting on the side of the bed. He stated that he doesn't feel well and he has chest pain for several days.  He went to the ED twice within the past week for same.  Pt left AMA earlier this week, went back a couple of days later for the  same. Today pt face is swollen on the right side, his eyes are red,  and he admits to head congestion.  He stated that he does have a cough but he doesn't know if he has had a fever. He's very warm to touch but I couldn't check his temperature.   I atempted to schedule a f/u appt with GI doctor (R. Buccini) but the receprtionist stated that pt is no longer able to see any physicians in the [facility].  I advised pt of the same; I told him to check with Dr. Alvis LemmingsNewlin next week during his visit.   Social securitiy administration information from pt to give to RipleyJane @ [facility].  Medication ordered: none  Tyrica Afzal, EMT Paramedic [phone] 04/02/2018    ACTION: Home visit completed

## 2018-04-03 ENCOUNTER — Other Ambulatory Visit: Payer: Self-pay | Admitting: Family Medicine

## 2018-04-03 MED ORDER — AMOXICILLIN 500 MG PO CAPS: 1000.0000 mg | ORAL_CAPSULE | Freq: Two times a day (BID) | ORAL | 0 refills | Status: DC

## 2018-04-03 MED ORDER — CLARITHROMYCIN 500 MG PO TABS: 500.0000 mg | ORAL_TABLET | Freq: Two times a day (BID) | ORAL | 0 refills | Status: DC

## 2018-04-03 MED ORDER — OMEPRAZOLE 20 MG PO CPDR: 20.0000 mg | DELAYED_RELEASE_CAPSULE | Freq: Two times a day (BID) | ORAL | 3 refills | Status: AC

## 2018-04-06 ENCOUNTER — Ambulatory Visit: Payer: Medicare Other | Attending: Family Medicine | Admitting: Family Medicine

## 2018-04-06 ENCOUNTER — Encounter: Payer: Self-pay | Admitting: Family Medicine

## 2018-04-06 ENCOUNTER — Other Ambulatory Visit: Payer: Self-pay | Admitting: Family Medicine

## 2018-04-06 VITALS — BP 109/67 | HR 105 | Temp 97.7°F | Ht 65.0 in | Wt 225.6 lb

## 2018-04-06 DIAGNOSIS — Z91018 Allergy to other foods: Secondary | ICD-10-CM | POA: Diagnosis not present

## 2018-04-06 DIAGNOSIS — Z95810 Presence of automatic (implantable) cardiac defibrillator: Secondary | ICD-10-CM | POA: Insufficient documentation

## 2018-04-06 DIAGNOSIS — Z590 Homelessness: Secondary | ICD-10-CM | POA: Insufficient documentation

## 2018-04-06 DIAGNOSIS — Z794 Long term (current) use of insulin: Secondary | ICD-10-CM | POA: Diagnosis not present

## 2018-04-06 DIAGNOSIS — Z7901 Long term (current) use of anticoagulants: Secondary | ICD-10-CM | POA: Insufficient documentation

## 2018-04-06 DIAGNOSIS — I5020 Unspecified systolic (congestive) heart failure: Secondary | ICD-10-CM

## 2018-04-06 DIAGNOSIS — Z79899 Other long term (current) drug therapy: Secondary | ICD-10-CM | POA: Insufficient documentation

## 2018-04-06 DIAGNOSIS — I4821 Permanent atrial fibrillation: Secondary | ICD-10-CM

## 2018-04-06 DIAGNOSIS — K257 Chronic gastric ulcer without hemorrhage or perforation: Secondary | ICD-10-CM

## 2018-04-06 DIAGNOSIS — E7800 Pure hypercholesterolemia, unspecified: Secondary | ICD-10-CM | POA: Insufficient documentation

## 2018-04-06 DIAGNOSIS — D649 Anemia, unspecified: Secondary | ICD-10-CM | POA: Diagnosis not present

## 2018-04-06 DIAGNOSIS — I5042 Chronic combined systolic (congestive) and diastolic (congestive) heart failure: Secondary | ICD-10-CM | POA: Diagnosis not present

## 2018-04-06 DIAGNOSIS — I110 Hypertensive heart disease with heart failure: Secondary | ICD-10-CM | POA: Diagnosis not present

## 2018-04-06 DIAGNOSIS — I252 Old myocardial infarction: Secondary | ICD-10-CM | POA: Insufficient documentation

## 2018-04-06 DIAGNOSIS — Z955 Presence of coronary angioplasty implant and graft: Secondary | ICD-10-CM | POA: Insufficient documentation

## 2018-04-06 DIAGNOSIS — Z951 Presence of aortocoronary bypass graft: Secondary | ICD-10-CM | POA: Diagnosis not present

## 2018-04-06 DIAGNOSIS — I2510 Atherosclerotic heart disease of native coronary artery without angina pectoris: Secondary | ICD-10-CM | POA: Insufficient documentation

## 2018-04-06 DIAGNOSIS — E1151 Type 2 diabetes mellitus with diabetic peripheral angiopathy without gangrene: Secondary | ICD-10-CM | POA: Diagnosis not present

## 2018-04-06 DIAGNOSIS — Z792 Long term (current) use of antibiotics: Secondary | ICD-10-CM | POA: Diagnosis not present

## 2018-04-06 DIAGNOSIS — Z9114 Patient's other noncompliance with medication regimen: Secondary | ICD-10-CM | POA: Insufficient documentation

## 2018-04-06 DIAGNOSIS — I255 Ischemic cardiomyopathy: Secondary | ICD-10-CM

## 2018-04-06 LAB — GLUCOSE, POCT (MANUAL RESULT ENTRY): POC GLUCOSE: 146 mg/dL — AB (ref 70–99)

## 2018-04-06 NOTE — Progress Notes (Signed)
TRANSITIONAL CARE CLINIC  Date of Telephone Encounter: 03/31/2018  Date of 1st service: 04/07/2018   Admit Date: 03/27/2018 Discharge Date: 03/30/2018    Subjective:  Patient ID: Jacob PunchesYahia [name], male    DOB: 1956-03-20  Age: 63 y.o. MRN: [id number]  CC: Congestive Heart Failure   HPI Jacob PunchesYahia [name] is a 63 year old male with a history of type 2 diabetes mellitus (A1c  6.5), chronic combined systolic and diastolic heart failure status post ICD (EF 25-30% from 03/2018 severely reduced systolic function), chronic atrial fibrillation (on anticoagulation with Xarelto, amiodarone discontinued as fibrotic interstitial lung disease could not be excluded on high-resolution CT chest) for follow-up visit at the transitional care clinic. He had a [facility] for lower GI bleed after he had presented with hemoglobin of 5.3 and received 4 PRBC.  He is status post EGD which revealed nonbleeding gastric ulcers, biopsies taken. Colonoscopy canceled due to poor bowel prep. He left AMA and his discharge hemoglobin was 8.2.  He represented to the ED 1 day later with chest pain and was ruled out for acute coronary syndrome. Pathology of biopsies returned positive for Helicobacter pylori for which I had sent triple antibiotic regimen to his pharmacy which he is currently taking. He denies presence of blood in his stools, denies abdominal pain. He has an appointment with GI but is unsure of the date.  Past Medical History:  Diagnosis Date  . Atrial fibrillation (HCC)    RVR 10/2014  . Automatic implantable cardioverter-defibrillator in situ   . CAD (coronary artery disease) Sept 2013   s/p cardiac cath showing occlusion of small RCA with collaterals  . CHF (congestive heart failure) (HCC)    20 to 25 % EF and RV dysfunction by 07/2014 echo   . Chronic anticoagulation    on xarelto.   . High cholesterol   . Hypertension   . Myocardial infarction (HCC) 2014  . Noncompliance    homelessness contributing.   . Peripheral arterial disease (HCC)   . Type II diabetes mellitus (HCC)     Past Surgical History:  Procedure Laterality Date  . BIOPSY  03/29/2018   Procedure: BIOPSY;  Surgeon: Vida RiggerMagod, Marc, MD;  [facility];  Service: Endoscopy;;  . CARDIAC CATHETERIZATION  09/2012  . CORONARY ANGIOPLASTY WITH STENT PLACEMENT  11/2011   "1"  . CORONARY ARTERY BYPASS GRAFT  09/2012   2 vessels per patient [name]([place])   . ESOPHAGOGASTRODUODENOSCOPY N/A 11/28/2014   Procedure: ESOPHAGOGASTRODUODENOSCOPY (EGD);  Surgeon: Beverley FiedlerJay M Pyrtle, MD;  [facility];  Service: Endoscopy;  Laterality: N/A;  . ESOPHAGOGASTRODUODENOSCOPY (EGD) WITH PROPOFOL N/A 03/29/2018   Procedure: ESOPHAGOGASTRODUODENOSCOPY (EGD) WITH PROPOFOL;  Surgeon: Vida RiggerMagod, Marc, MD;  [facility];  Service: Endoscopy;  Laterality: N/A;  . ILIAC ARTERY STENT Right 08/30/2013  . IMPLANTABLE CARDIOVERTER DEFIBRILLATOR IMPLANT     [place] in 07/2012; [employer]  . LEFT AND RIGHT HEART CATHETERIZATION WITH CORONARY ANGIOGRAM N/A 12/02/2011   Procedure: LEFT AND RIGHT HEART CATHETERIZATION WITH CORONARY ANGIOGRAM;  Surgeon: Kathleene Hazelhristopher D McAlhany, MD;  [facility];  Service: Cardiovascular;  Laterality: N/A;  . LOWER EXTREMITY ANGIOGRAM N/A 08/30/2013   Procedure: LOWER EXTREMITY ANGIOGRAM;  Surgeon: Runell GessJonathan J Berry, MD;  [facility];  Service: Cardiovascular;  Laterality: N/A;  . LOWER EXTREMITY ANGIOGRAM N/A 12/02/2013   Procedure: LOWER EXTREMITY ANGIOGRAM;  Surgeon: Runell GessJonathan J Berry, MD;  [facility];  Service: Cardiovascular;  Laterality: N/A;  .  RIGHT/LEFT HEART CATH AND CORONARY/GRAFT ANGIOGRAPHY N/A 04/23/2017   Procedure: RIGHT/LEFT HEART CATH AND CORONARY/GRAFT ANGIOGRAPHY;  Surgeon: Dolores Patty, MD;  [facility];  Service: Cardiovascular;  Laterality: N/A;    Allergies  Allergen Reactions  . Pork-Derived Products Other (See Comments)     Religious preference      Outpatient Medications Prior to Visit  Medication Sig Dispense Refill  . ACCU-CHEK SOFTCLIX LANCETS lancets Use for once daily testing of blood sugar 100 each 12  . albuterol (PROVENTIL) (2.5 MG/3ML) 0.083% nebulizer solution Take 3 mLs (2.5 mg total) by nebulization every 6 (six) hours as needed for wheezing or shortness of breath. 150 mL 1  . allopurinol (ZYLOPRIM) 100 MG tablet TAKE 2 TABLETS (200 MG TOTAL) BY MOUTH EVERY MORNING (Patient taking differently: Take 200 mg by mouth daily. ) 60 tablet 3  . amoxicillin (AMOXIL) 500 MG capsule Take 2 capsules (1,000 mg total) by mouth 2 (two) times daily. Take in addition to Biaxin and omeprazole 56 capsule 0  . atorvastatin (LIPITOR) 40 MG tablet TAKE 1 TABLET BY MOUTH EVERY EVENING AT 6 PM (Patient taking differently: Take 40 mg by mouth daily at 6 PM. ) 30 tablet 3  . Blood Glucose Monitoring Suppl (ACCU-CHEK AVIVA) device Use as instructed1 times daily before meals 1 each 0  . cephALEXin (KEFLEX) 500 MG capsule Take 1 capsule (500 mg total) by mouth 2 (two) times daily. 20 capsule 0  . cetirizine (ZYRTEC) 10 MG tablet TAKE 1 TABLET (10 MG TOTAL) BY MOUTH DAILY. 30 tablet 2  . clarithromycin (BIAXIN) 500 MG tablet Take 1 tablet (500 mg total) by mouth 2 (two) times daily. Take in addition to omeprazole and amoxicillin.  Hold atorvastatin while on clarithromycin 28 tablet 0  . digoxin (LANOXIN) 0.125 MG tablet Take 0.5 tablets (62.5 mcg total) by mouth daily. 14 tablet 3  . gabapentin (NEURONTIN) 300 MG capsule Take 1 capsule (300 mg total) by mouth daily. 30 capsule 3  . glipiZIDE (GLUCOTROL) 10 MG tablet Take 1 tablet (10 mg total) by mouth 2 (two) times daily before a meal. 60 tablet 5  . glucose blood (ACCU-CHEK AVIVA) test strip Use as instructed for 1 times daily testing of blood sugar 100 each 12  . hydrALAZINE (APRESOLINE) 25 MG tablet TAKE 1 TABLET BY MOUTH THREE TIMES DAILY ( EVERY MORNING, NOON,EVENING )  (Patient taking differently: Take 25 mg by mouth 3 (three) times daily. ( EVERY MORNING, NOON,EVENING )) 90 tablet 3  . Insulin Glargine (LANTUS) 100 UNIT/ML Solostar Pen Inject 48 Units into the skin daily at 10 pm. (Patient taking differently: Inject 50 Units into the skin daily at 10 pm. ) 5 pen 5  . isosorbide mononitrate (IMDUR) 30 MG 24 hr tablet Take 1 tablet (30 mg total) by mouth daily. 30 tablet 3  . Lancet Devices (ACCU-CHEK SOFTCLIX) lancets Use as instructed for once daily testing of blood sugar 1 each 0  . latanoprost (XALATAN) 0.005 % ophthalmic solution Place 1 drop into both eyes daily.    . meclizine (ANTIVERT) 25 MG tablet TAKE 1 TABLET (25 MG TOTAL) BY MOUTH 3 (THREE) TIMES DAILY AS NEEDED FOR DIZZINESS. 60 tablet 0  . metolazone (ZAROXOLYN) 2.5 MG tablet Take 1 tablet once as directed by CHF clinic. (Patient taking differently: Take 2.5 mg by mouth 2 (two) times a week. Monday and Friday) 10 tablet 0  . Misc. Devices MISC Shower chair.  Diagnosis: CHF 1 each  0  . omeprazole (PRILOSEC) 20 MG capsule Take 1 capsule (20 mg total) by mouth 2 (two) times daily before a meal. 60 capsule 3  . potassium chloride SA (K-DUR,KLOR-CON) 20 MEQ tablet Take 2 tablets (40 mEq total) by mouth daily as needed. TAKE ONLY ON DAYS YOU TAKE METOLAZONE!! (Patient taking differently: Take 20 mEq by mouth 2 (two) times a week. TAKE ONLY ON DAYS YOU TAKE METOLAZONE!! Monday and Friday) 60 tablet 11  . spironolactone (ALDACTONE) 25 MG tablet Take 0.5 tablets (12.5 mg total) by mouth daily. 30 tablet 3  . torsemide (DEMADEX) 20 MG tablet Take 4 tablets (80 mg total) by mouth 2 (two) times daily. 240 tablet 3  . traZODone (DESYREL) 100 MG tablet Take 1 tablet (100 mg total) by mouth at bedtime as needed for sleep. 30 tablet 3  . VENTOLIN HFA 108 (90 Base) MCG/ACT inhaler INHALE 2 PUFFS INTO THE LUNGS EVERY 6 (SIX) HOURS AS NEEDED FOR WHEEZING OR SHORTNESS OF BREATH. (Patient taking differently: Inhale 2 puffs  into the lungs every 6 (six) hours as needed for wheezing or shortness of breath. ) 18 g 0  . XARELTO 20 MG TABS tablet TAKE ONE TABLET BY MOUTH ONCE DAILY (Patient taking differently: Take 20 mg by mouth daily with supper. ) 30 tablet 0  . acetaminophen-codeine (TYLENOL #3) 300-30 MG tablet TAKE 1 TABLET BY MOUTH AT BEDTIME AS NEEDED FOR MODERATE PAIN. (Patient not taking: Reported on 03/27/2018) 30 tablet 0  . diclofenac sodium (VOLTAREN) 1 % GEL APPLY 4 GRAMS TOPICALLY 4 (FOUR) TIMES DAILY (Patient not taking: Reported on 03/27/2018) 100 g 0  . ergocalciferol (DRISDOL) 50000 units capsule Take 1 capsule (50,000 Units total) by mouth once a week. (Patient not taking: Reported on 03/19/2018) 4 capsule 1   No facility-administered medications prior to visit.     ROS Review of Systems  Constitutional: Negative for activity change and appetite change.  HENT: Negative for sinus pressure and sore throat.   Eyes: Negative for visual disturbance.  Respiratory: Negative for cough, chest tightness and shortness of breath.   Cardiovascular: Negative for chest pain and leg swelling.  Gastrointestinal: Negative for abdominal distention, abdominal pain, constipation and diarrhea.  Endocrine: Negative.   Genitourinary: Negative for dysuria.  Musculoskeletal: Negative for joint swelling and myalgias.  Skin: Negative for rash.  Allergic/Immunologic: Negative.   Neurological: Negative for weakness, light-headedness and numbness.  Psychiatric/Behavioral: Negative for dysphoric mood and suicidal ideas.    Objective:  BP 109/67   Pulse (!) 105   Temp 97.7 F (36.5 C) (Oral)   Ht 5\' 5"  (1.651 m)   Wt 225 lb 9.6 oz (102.3 kg)   SpO2 98%   BMI 37.54 kg/m   BP/Weight 04/06/2018 04/02/2018 03/31/2018  Systolic BP 109 108 123  Diastolic BP 67 60 69  Wt. (Lbs) 225.6 228 235.89  BMI 37.54 37.94 39.25      Physical Exam Constitutional:      Appearance: He is well-developed.  Cardiovascular:     Rate and  Rhythm: Tachycardia present. Rhythm irregular.     Heart sounds: Normal heart sounds. No murmur.     Comments: Unable to palpate R DP pulse Pulmonary:     Effort: Pulmonary effort is normal.     Breath sounds: Normal breath sounds. No wheezing or rales.  Chest:     Chest wall: No tenderness.  Abdominal:     General: Bowel sounds are normal. There is no distension.  Palpations: Abdomen is soft. There is no mass.     Tenderness: There is no abdominal tenderness.  Musculoskeletal: Normal range of motion.  Neurological:     Mental Status: He is alert and oriented to person, place, and time.     Lab Results  Component Value Date   HGBA1C 6.5 03/19/2018    Assessment & Plan:   1. DM (diabetes mellitus), type 2 with peripheral vascular complications (HCC) Controlled with A1c of 6.5 Continue current regimen - POCT glucose (manual entry)  2. Permanent atrial fibrillation Advised to continue Xarelto  3. Anemia requiring transfusions Asymptomatic Will repeat hemoglobin; discharge CBC was 8.2 Keep appointment with GI - CBC with Differential/Platelet  4. Chronic gastric ulcer without hemorrhage and without perforation Secondary to #3 above  5. Systolic congestive heart failure, unspecified HF chronicity (HCC) Euvolemic Continue current regimen Followed by CHF and paramedicine   No orders of the defined types were placed in this encounter.   Follow-up: Return for Follow-up of chronic medical conditions, keep previously scheduled appointment.   Hoy Register MD

## 2018-04-06 NOTE — Patient Instructions (Signed)
Helicobacter Pylori Infection  Helicobacter pylori infection is a bacterial infection in the stomach. Long-term (chronic) infection can cause stomach irritation (gastritis), ulcers in the stomach (gastric ulcers), and ulcers in the upper part of the intestine (duodenal ulcers). Having this infection may also increase your risk of stomach cancer and a type of white blood cell cancer (lymphoma) that affects the stomach.  What are the causes?  This infection is caused by the Helicobacter pylori (H. pylori) bacteria. Many healthy people have this bacteria in their stomach lining. The bacteria may also spread from person to person through contact with stool (feces) or saliva. It is not known why some people develop ulcers, gastritis, or cancer from the bacteria.  What increases the risk?  You are more likely to develop this condition if you:   Have family members with the infection.   Live with many other people, such as in a dormitory.   Are of African, Hispanic, or Asian descent.  What are the signs or symptoms?  Most people with this infection do not have any symptoms. If you do have symptoms, they may include:   Heartburn.   Stomach pain.   Nausea.   Vomiting. The vomit may be bloody because of ulcers.   Loss of appetite.   Bad breath.  How is this diagnosed?  This condition may be diagnosed based on:   Your symptoms and medical history.   A physical exam.   Blood tests.   Stool tests.   A breath test.   A procedure that involves placing a tube with a camera on the end of it down your throat to examine your stomach and upper intestine (upper endoscopy).   Removing and testing a tissue sample from the stomach lining (biopsy). A biopsy may be taken during an upper endoscopy.  How is this treated?    This condition is treated by taking a combination of medicines (triple therapy) for several weeks. Triple therapy includes one medicine to reduce the amount of acid in your stomach and two types of  antibiotic medicines. This treatment may reduce your risk of cancer.  You may need to be tested for H. pylori again after treatment. In some cases, the treatment may need to be repeated if your treatment did not get rid of all the bacteria.  Follow these instructions at home:     Take over-the-counter and prescription medicines only as told by your health care provider.   Take your antibiotics as told by your health care provider. Do not stop taking the antibiotics even if you start to feel better.   Return to your normal activities as told by your health care provider. Ask your health care provider what activities are safe for you.   Take steps to prevent future infections:  ? Wash your hands often with soap and water. If soap and water are not available, use hand sanitizer.  ? Do not eat food or drink water that may have had contact with stool or saliva.   Keep all follow-up visits as told by your health care provider. This is important. You may need tests to make sure your treatment worked.  Contact a health care provider if your symptoms:   Do not get better with treatment.   Return after treatment.  Summary   Helicobacter pylori infection is a stomach infection caused by the Helicobacter pylori (H. pylori) bacteria.   This infection can cause stomach irritation (gastritis), ulcers in the stomach (gastric ulcers), and ulcers   in the upper part of the intestine (duodenal ulcers).   This condition is treated by taking a combination of medicines (triple therapy) for several weeks.   Take your antibiotics as told by your health care provider. Do not stop taking the antibiotics even if you start to feel better.  This information is not intended to replace advice given to you by your health care provider. Make sure you discuss any questions you have with your health care provider.  Document Released: 07/03/2015 Document Revised: 03/04/2017 Document Reviewed: 03/04/2017  Elsevier Interactive Patient Education   2019 [employer]

## 2018-04-07 LAB — CBC WITH DIFFERENTIAL/PLATELET
BASOS: 1 %
Basophils Absolute: 0.1 10*3/uL (ref 0.0–0.2)
EOS (ABSOLUTE): 0.1 10*3/uL (ref 0.0–0.4)
Eos: 1 %
HEMATOCRIT: 28 % — AB (ref 37.5–51.0)
Hemoglobin: 8.4 g/dL — ABNORMAL LOW (ref 13.0–17.7)
Immature Grans (Abs): 0 10*3/uL (ref 0.0–0.1)
Immature Granulocytes: 0 %
LYMPHS ABS: 1 10*3/uL (ref 0.7–3.1)
Lymphs: 13 %
MCH: 21.4 pg — ABNORMAL LOW (ref 26.6–33.0)
MCHC: 30 g/dL — ABNORMAL LOW (ref 31.5–35.7)
MCV: 71 fL — ABNORMAL LOW (ref 79–97)
MONOCYTES: 10 %
Monocytes Absolute: 0.8 10*3/uL (ref 0.1–0.9)
Neutrophils Absolute: 5.8 10*3/uL (ref 1.4–7.0)
Neutrophils: 75 %
Platelets: 202 10*3/uL (ref 150–450)
RBC: 3.92 x10E6/uL — ABNORMAL LOW (ref 4.14–5.80)
RDW: 26.2 % — ABNORMAL HIGH (ref 11.6–15.4)
WBC: 7.7 10*3/uL (ref 3.4–10.8)

## 2018-04-09 ENCOUNTER — Other Ambulatory Visit (HOSPITAL_COMMUNITY): Payer: Self-pay

## 2018-04-09 NOTE — Progress Notes (Signed)
Paramedicine Encounter    Patient ID: Jacob [name], male    DOB: March 26, 1955, 63 y.o.   MRN: [id number]    Patient Care Team: Hoy Register, MD as PCP - General (Family Medicine) Bensimhon, Bevelyn Buckles, MD as PCP - Cardiology (Cardiology) Marinus Maw, MD as PCP - Electrophysiology (Cardiology) Clarisa Schools, RN as Registered Nurse Rourk, Gerrit Friends, MD as Consulting Physician (Gastroenterology) Pleasant, Dennard Schaumann, RN as [facility] Care Management Uris, Wyn Quaker, LCSW as Social Worker (Licensed Clinical Social Worker)  Patient Active Problem List   Diagnosis Date Noted  . Anemia requiring transfusions   . Hypokalemia   . Vitamin D deficiency 07/07/2017  . Exertional angina (HCC)   . Chronic systolic heart failure (HCC) 12/17/2016  . Hyperglycemia 12/02/2016  . Osteoarthritis of right knee 12/02/2016  . Congestive heart disease (HCC) 01/01/2016  . Diabetic neuropathy (HCC) 11/28/2015  . Ascites 11/09/2015  . CHF (congestive heart failure) (HCC) 11/09/2015  . Insomnia 04/18/2015  . Claudication of right lower extremity (HCC) 04/07/2015  . Cardiomyopathy, ischemic 04/07/2015  . Chronic anticoagulation 03/30/2015  . Cardiorenal syndrome with renal failure   . SOB (shortness of breath)   . Morbid obesity due to excess calories (HCC)   . Helicobacter pylori ab+ 12/12/2014  . Calf pain   . Duodenal ulcer with hemorrhage   . Hemorrhagic shock (HCC)   . Diabetes mellitus (HCC)   . Coronary artery disease involving native coronary artery of native heart without angina pectoris   . Atrial fibrillation with rapid ventricular response (HCC) 10/29/2014  . Chest pain   . Atherosclerosis of native arteries of extremity with intermittent claudication (HCC) 09/06/2014  . Atrial fibrillation with controlled ventricular response (HCC) 08/03/2014  . Bacteremia   . DM (diabetes mellitus), type 2 with peripheral vascular complications (HCC) 08/01/2014  . Anemia of chronic disease  08/01/2014  . PVD (peripheral vascular disease) (HCC) 10/29/2013  . Tobacco abuse 05/13/2013  . AF (atrial fibrillation) (HCC) 02/11/2013  . ICD - in place- BS May 2014 [facility] 02/11/2013  . Microcytic anemia 01/11/2013  . 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 Noncompliance 12/01/2011  . Essential hypertension 10/30/2006    Current Outpatient Medications:  .  ACCU-CHEK SOFTCLIX LANCETS lancets, Use for once daily testing of blood sugar, Disp: 100 each, Rfl: 12 .  acetaminophen-codeine (TYLENOL #3) 300-30 MG tablet, TAKE 1 TABLET BY MOUTH AT BEDTIME AS NEEDED FOR MODERATE PAIN. (Patient not taking: Reported on 03/27/2018), Disp: 30 tablet, Rfl: 0 .  albuterol (PROVENTIL) (2.5 MG/3ML) 0.083% nebulizer solution, Take 3 mLs (2.5 mg total) by nebulization every 6 (six) hours as needed for wheezing or shortness of breath., Disp: 150 mL, Rfl: 1 .  allopurinol (ZYLOPRIM) 100 MG tablet, TAKE 2 TABLETS (200 MG TOTAL) BY MOUTH EVERY MORNING (Patient taking differently: Take 200 mg by mouth daily. ), Disp: 60 tablet, Rfl: 3 .  amoxicillin (AMOXIL) 500 MG capsule, Take 2 capsules (1,000 mg total) by mouth 2 (two) times daily. Take in addition to Biaxin and omeprazole, Disp: 56 capsule, Rfl: 0 .  atorvastatin (LIPITOR) 40 MG tablet, TAKE 1 TABLET BY MOUTH EVERY EVENING AT 6 PM (Patient taking differently: Take 40 mg by mouth daily at 6 PM. ), Disp: 30 tablet, Rfl: 3 .  Blood Glucose Monitoring Suppl (ACCU-CHEK AVIVA) device, Use as instructed1 times daily before meals, Disp: 1 each, Rfl: 0 .  cephALEXin (KEFLEX) 500 MG capsule, Take 1 capsule (500  mg total) by mouth 2 (two) times daily., Disp: 20 capsule, Rfl: 0 .  cetirizine (ZYRTEC) 10 MG tablet, TAKE 1 TABLET (10 MG TOTAL) BY MOUTH DAILY., Disp: 30 tablet, Rfl: 2 .  clarithromycin (BIAXIN) 500 MG tablet, Take 1 tablet (500 mg total) by mouth 2 (two) times daily. Take in addition to omeprazole and amoxicillin.  Hold atorvastatin while on  clarithromycin, Disp: 28 tablet, Rfl: 0 .  diclofenac sodium (VOLTAREN) 1 % GEL, APPLY 4 GRAMS TOPICALLY 4 (FOUR) TIMES DAILY (Patient not taking: Reported on 03/27/2018), Disp: 100 g, Rfl: 0 .  digoxin (LANOXIN) 0.125 MG tablet, Take 0.5 tablets (62.5 mcg total) by mouth daily., Disp: 14 tablet, Rfl: 3 .  ergocalciferol (DRISDOL) 50000 units capsule, Take 1 capsule (50,000 Units total) by mouth once a week. (Patient not taking: Reported on 03/19/2018), Disp: 4 capsule, Rfl: 1 .  gabapentin (NEURONTIN) 300 MG capsule, Take 1 capsule (300 mg total) by mouth daily., Disp: 30 capsule, Rfl: 3 .  glipiZIDE (GLUCOTROL) 10 MG tablet, Take 1 tablet (10 mg total) by mouth 2 (two) times daily before a meal., Disp: 60 tablet, Rfl: 5 .  glucose blood (ACCU-CHEK AVIVA) test strip, Use as instructed for 1 times daily testing of blood sugar, Disp: 100 each, Rfl: 12 .  hydrALAZINE (APRESOLINE) 25 MG tablet, TAKE 1 TABLET BY MOUTH THREE TIMES DAILY ( EVERY MORNING, NOON,EVENING ) (Patient taking differently: Take 25 mg by mouth 3 (three) times daily. ( EVERY MORNING, NOON,EVENING )), Disp: 90 tablet, Rfl: 3 .  Insulin Glargine (LANTUS) 100 UNIT/ML Solostar Pen, Inject 48 Units into the skin daily at 10 pm. (Patient taking differently: Inject 50 Units into the skin daily at 10 pm. ), Disp: 5 pen, Rfl: 5 .  isosorbide mononitrate (IMDUR) 30 MG 24 hr tablet, Take 1 tablet (30 mg total) by mouth daily., Disp: 30 tablet, Rfl: 3 .  Lancet Devices (ACCU-CHEK SOFTCLIX) lancets, Use as instructed for once daily testing of blood sugar, Disp: 1 each, Rfl: 0 .  latanoprost (XALATAN) 0.005 % ophthalmic solution, Place 1 drop into both eyes daily., Disp: , Rfl:  .  meclizine (ANTIVERT) 25 MG tablet, TAKE 1 TABLET (25 MG TOTAL) BY MOUTH 3 (THREE) TIMES DAILY AS NEEDED FOR DIZZINESS., Disp: 60 tablet, Rfl: 0 .  metolazone (ZAROXOLYN) 2.5 MG tablet, TAKE 1 TABLET ONCE AS DIRECTED BY CHF CLINIC., Disp: 10 tablet, Rfl: 0 .  Misc. Devices  MISC, Paediatric nurse.  Diagnosis: CHF, Disp: 1 each, Rfl: 0 .  omeprazole (PRILOSEC) 20 MG capsule, Take 1 capsule (20 mg total) by mouth 2 (two) times daily before a meal., Disp: 60 capsule, Rfl: 3 .  potassium chloride SA (K-DUR,KLOR-CON) 20 MEQ tablet, Take 2 tablets (40 mEq total) by mouth daily as needed. TAKE ONLY ON DAYS YOU TAKE METOLAZONE!! (Patient taking differently: Take 20 mEq by mouth 2 (two) times a week. TAKE ONLY ON DAYS YOU TAKE METOLAZONE!! Monday and Friday), Disp: 60 tablet, Rfl: 11 .  spironolactone (ALDACTONE) 25 MG tablet, Take 0.5 tablets (12.5 mg total) by mouth daily., Disp: 30 tablet, Rfl: 3 .  torsemide (DEMADEX) 20 MG tablet, Take 4 tablets (80 mg total) by mouth 2 (two) times daily., Disp: 240 tablet, Rfl: 3 .  traZODone (DESYREL) 100 MG tablet, Take 1 tablet (100 mg total) by mouth at bedtime as needed for sleep., Disp: 30 tablet, Rfl: 3 .  VENTOLIN HFA 108 (90 Base) MCG/ACT inhaler, INHALE 2 PUFFS INTO THE LUNGS EVERY  6 (SIX) HOURS AS NEEDED FOR WHEEZING OR SHORTNESS OF BREATH. (Patient taking differently: Inhale 2 puffs into the lungs every 6 (six) hours as needed for wheezing or shortness of breath. ), Disp: 18 g, Rfl: 0 .  XARELTO 20 MG TABS tablet, TAKE ONE TABLET BY MOUTH ONCE DAILY (Patient taking differently: Take 20 mg by mouth daily with supper. ), Disp: 30 tablet, Rfl: 0 Allergies  Allergen Reactions  . Pork-Derived Products Other (See Comments)    Religious preference     Social History   Socioeconomic History  . Marital status: Divorced    Spouse name: Not on file  . Number of children: 3  . Years of education: Not on file  . Highest education level: Not on file  Occupational History  . Occupation: disabled  Social Needs  . Financial resource strain: Not on file  . Food insecurity:    Worry: Not on file    Inability: Not on file  . Transportation needs:    Medical: Not on file    Non-medical: Not on file  Tobacco Use  . Smoking status:  Current Every Day Smoker    Packs/day: 0.50    Years: 40.00    Pack years: 20.00    Types: Cigarettes  . Smokeless tobacco: Never Used  Substance and Sexual Activity  . Alcohol use: No    Alcohol/week: 0.0 standard drinks  . Drug use: No  . Sexual activity: Not Currently  Lifestyle  . Physical activity:    Days per week: Not on file    Minutes per session: Not on file  . Stress: Not on file  Relationships  . Social connections:    Talks on phone: Not on file    Gets together: Not on file    Attends religious service: Not on file    Active member of club or organization: Not on file    Attends meetings of clubs or organizations: Not on file    Relationship status: Not on file  . Intimate partner violence:    Fear of current or ex partner: Not on file    Emotionally abused: Not on file    Physically abused: Not on file    Forced sexual activity: Not on file  Other Topics Concern  . Not on file  Social History Narrative   Has an apartment with a roommate. He was living on the streets in 01/27/2013.  He reports that his father died in [place] in 2013-01-27.  He is divorced.  He is no longer estranged from his son, but still from his daughter who lives locally.  Neither of his parents, nor any siblings have any history of CAD.    Physical Exam Pulmonary:     Effort: Pulmonary effort is normal. No respiratory distress.     Breath sounds: No wheezing or rales.  Abdominal:     Tenderness: There is no abdominal tenderness. There is no guarding.  Musculoskeletal:        General: No swelling.     Right lower leg: No edema.     Left lower leg: No edema.  Skin:    General: Skin is warm and dry.         Future Appointments  Date Time Provider Department Center  05/28/2018  2:30 PM [facility] PA/NP [facility] None     BP 120/78 (BP Location: Right Arm, Patient Position: Sitting, Cuff Size: Normal)   Pulse 96   Resp 12   Wt  216 lb 6.4 oz (98.2 kg)   SpO2 98%   BMI 36.01  kg/m  CBG 121  Weight yesterday-didn't weigh Last visit weight-228 (04/02/18)  ATF pt CAO x4 sitting on the edge of the bed, just waking up. Pt stated that he feels good and has no complaints.  He stated that he has taken all of his medications however he's still working on the medication bubble pack for the past 1-2 weeks.  Pt denies sob, chest pain and dizziness.  rx bubble packs verified.  Atorvastatin was taken out of this week's bubble pack.   Pt is currently taking amoxicillin 500 mg, cephalexin 500mg  clarithromycin 500mg  for GI infection.  Pt advised to hold atorvastatin while taking clarithromycin (per rx bottle instructions). J[facility] @ [facility] made aware of the same.   Medication ordered: None  Edna Grover, EMT Paramedic [phone] 04/09/2018    ACTION: Home visit completed

## 2018-04-10 ENCOUNTER — Telehealth: Payer: Self-pay

## 2018-04-10 NOTE — Telephone Encounter (Signed)
Patient was called and informed of lab results and to keep appointment with GI.

## 2018-04-10 NOTE — Telephone Encounter (Signed)
-----   Message from Hoy Register, MD sent at 04/07/2018  2:13 PM EST ----- Labs reveal anemia which is stable compared to previous labs.  Please encourage to keep appointment with GI

## 2018-04-23 ENCOUNTER — Other Ambulatory Visit (HOSPITAL_COMMUNITY): Payer: Self-pay

## 2018-04-23 ENCOUNTER — Telehealth (HOSPITAL_COMMUNITY): Payer: Self-pay

## 2018-04-23 NOTE — Telephone Encounter (Signed)
I called pt to confirm today's [facility] visit 

## 2018-04-23 NOTE — Telephone Encounter (Signed)
Pt and Dee notified. Verbalizes understanding. Pt has appt on 02/06 @ 2pm with APP.

## 2018-04-23 NOTE — Progress Notes (Signed)
Paramedicine Encounter    Patient ID: Jacob [name], male    DOB: March 26, 1955, 63 y.o.   MRN: [id number]    Patient Care Team: Hoy Register, MD as PCP - General (Family Medicine) Bensimhon, Bevelyn Buckles, MD as PCP - Cardiology (Cardiology) Marinus Maw, MD as PCP - Electrophysiology (Cardiology) Clarisa Schools, RN as Registered Nurse Rourk, Gerrit Friends, MD as Consulting Physician (Gastroenterology) Pleasant, Dennard Schaumann, RN as [facility] Care Management Uris, Wyn Quaker, LCSW as Social Worker (Licensed Clinical Social Worker)  Patient Active Problem List   Diagnosis Date Noted  . Anemia requiring transfusions   . Hypokalemia   . Vitamin D deficiency 07/07/2017  . Exertional angina (HCC)   . Chronic systolic heart failure (HCC) 12/17/2016  . Hyperglycemia 12/02/2016  . Osteoarthritis of right knee 12/02/2016  . Congestive heart disease (HCC) 01/01/2016  . Diabetic neuropathy (HCC) 11/28/2015  . Ascites 11/09/2015  . CHF (congestive heart failure) (HCC) 11/09/2015  . Insomnia 04/18/2015  . Claudication of right lower extremity (HCC) 04/07/2015  . Cardiomyopathy, ischemic 04/07/2015  . Chronic anticoagulation 03/30/2015  . Cardiorenal syndrome with renal failure   . SOB (shortness of breath)   . Morbid obesity due to excess calories (HCC)   . Helicobacter pylori ab+ 12/12/2014  . Calf pain   . Duodenal ulcer with hemorrhage   . Hemorrhagic shock (HCC)   . Diabetes mellitus (HCC)   . Coronary artery disease involving native coronary artery of native heart without angina pectoris   . Atrial fibrillation with rapid ventricular response (HCC) 10/29/2014  . Chest pain   . Atherosclerosis of native arteries of extremity with intermittent claudication (HCC) 09/06/2014  . Atrial fibrillation with controlled ventricular response (HCC) 08/03/2014  . Bacteremia   . DM (diabetes mellitus), type 2 with peripheral vascular complications (HCC) 08/01/2014  . Anemia of chronic disease  08/01/2014  . PVD (peripheral vascular disease) (HCC) 10/29/2013  . Tobacco abuse 05/13/2013  . AF (atrial fibrillation) (HCC) 02/11/2013  . ICD - in place- BS May 2014 [facility] 02/11/2013  . Microcytic anemia 01/11/2013  . 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 Noncompliance 12/01/2011  . Essential hypertension 10/30/2006    Current Outpatient Medications:  .  ACCU-CHEK SOFTCLIX LANCETS lancets, Use for once daily testing of blood sugar, Disp: 100 each, Rfl: 12 .  acetaminophen-codeine (TYLENOL #3) 300-30 MG tablet, TAKE 1 TABLET BY MOUTH AT BEDTIME AS NEEDED FOR MODERATE PAIN. (Patient not taking: Reported on 03/27/2018), Disp: 30 tablet, Rfl: 0 .  albuterol (PROVENTIL) (2.5 MG/3ML) 0.083% nebulizer solution, Take 3 mLs (2.5 mg total) by nebulization every 6 (six) hours as needed for wheezing or shortness of breath., Disp: 150 mL, Rfl: 1 .  allopurinol (ZYLOPRIM) 100 MG tablet, TAKE 2 TABLETS (200 MG TOTAL) BY MOUTH EVERY MORNING (Patient taking differently: Take 200 mg by mouth daily. ), Disp: 60 tablet, Rfl: 3 .  amoxicillin (AMOXIL) 500 MG capsule, Take 2 capsules (1,000 mg total) by mouth 2 (two) times daily. Take in addition to Biaxin and omeprazole, Disp: 56 capsule, Rfl: 0 .  atorvastatin (LIPITOR) 40 MG tablet, TAKE 1 TABLET BY MOUTH EVERY EVENING AT 6 PM (Patient taking differently: Take 40 mg by mouth daily at 6 PM. ), Disp: 30 tablet, Rfl: 3 .  Blood Glucose Monitoring Suppl (ACCU-CHEK AVIVA) device, Use as instructed1 times daily before meals, Disp: 1 each, Rfl: 0 .  cephALEXin (KEFLEX) 500 MG capsule, Take 1 capsule (500  mg total) by mouth 2 (two) times daily., Disp: 20 capsule, Rfl: 0 .  cetirizine (ZYRTEC) 10 MG tablet, TAKE 1 TABLET (10 MG TOTAL) BY MOUTH DAILY., Disp: 30 tablet, Rfl: 2 .  clarithromycin (BIAXIN) 500 MG tablet, Take 1 tablet (500 mg total) by mouth 2 (two) times daily. Take in addition to omeprazole and amoxicillin.  Hold atorvastatin while on  clarithromycin, Disp: 28 tablet, Rfl: 0 .  diclofenac sodium (VOLTAREN) 1 % GEL, APPLY 4 GRAMS TOPICALLY 4 (FOUR) TIMES DAILY (Patient not taking: Reported on 03/27/2018), Disp: 100 g, Rfl: 0 .  digoxin (LANOXIN) 0.125 MG tablet, Take 0.5 tablets (62.5 mcg total) by mouth daily., Disp: 14 tablet, Rfl: 3 .  ergocalciferol (DRISDOL) 50000 units capsule, Take 1 capsule (50,000 Units total) by mouth once a week. (Patient not taking: Reported on 03/19/2018), Disp: 4 capsule, Rfl: 1 .  gabapentin (NEURONTIN) 300 MG capsule, Take 1 capsule (300 mg total) by mouth daily., Disp: 30 capsule, Rfl: 3 .  glipiZIDE (GLUCOTROL) 10 MG tablet, Take 1 tablet (10 mg total) by mouth 2 (two) times daily before a meal., Disp: 60 tablet, Rfl: 5 .  glucose blood (ACCU-CHEK AVIVA) test strip, Use as instructed for 1 times daily testing of blood sugar, Disp: 100 each, Rfl: 12 .  hydrALAZINE (APRESOLINE) 25 MG tablet, TAKE 1 TABLET BY MOUTH THREE TIMES DAILY ( EVERY MORNING, NOON,EVENING ) (Patient taking differently: Take 25 mg by mouth 3 (three) times daily. ( EVERY MORNING, NOON,EVENING )), Disp: 90 tablet, Rfl: 3 .  Insulin Glargine (LANTUS) 100 UNIT/ML Solostar Pen, Inject 48 Units into the skin daily at 10 pm. (Patient taking differently: Inject 50 Units into the skin daily at 10 pm. ), Disp: 5 pen, Rfl: 5 .  isosorbide mononitrate (IMDUR) 30 MG 24 hr tablet, Take 1 tablet (30 mg total) by mouth daily., Disp: 30 tablet, Rfl: 3 .  Lancet Devices (ACCU-CHEK SOFTCLIX) lancets, Use as instructed for once daily testing of blood sugar, Disp: 1 each, Rfl: 0 .  latanoprost (XALATAN) 0.005 % ophthalmic solution, Place 1 drop into both eyes daily., Disp: , Rfl:  .  meclizine (ANTIVERT) 25 MG tablet, TAKE 1 TABLET (25 MG TOTAL) BY MOUTH 3 (THREE) TIMES DAILY AS NEEDED FOR DIZZINESS., Disp: 60 tablet, Rfl: 0 .  metolazone (ZAROXOLYN) 2.5 MG tablet, TAKE 1 TABLET ONCE AS DIRECTED BY CHF CLINIC., Disp: 10 tablet, Rfl: 0 .  Misc. Devices  MISC, Paediatric nurse.  Diagnosis: CHF, Disp: 1 each, Rfl: 0 .  omeprazole (PRILOSEC) 20 MG capsule, Take 1 capsule (20 mg total) by mouth 2 (two) times daily before a meal., Disp: 60 capsule, Rfl: 3 .  potassium chloride SA (K-DUR,KLOR-CON) 20 MEQ tablet, Take 2 tablets (40 mEq total) by mouth daily as needed. TAKE ONLY ON DAYS YOU TAKE METOLAZONE!! (Patient taking differently: Take 20 mEq by mouth 2 (two) times a week. TAKE ONLY ON DAYS YOU TAKE METOLAZONE!! Monday and Friday), Disp: 60 tablet, Rfl: 11 .  spironolactone (ALDACTONE) 25 MG tablet, Take 0.5 tablets (12.5 mg total) by mouth daily., Disp: 30 tablet, Rfl: 3 .  torsemide (DEMADEX) 20 MG tablet, Take 4 tablets (80 mg total) by mouth 2 (two) times daily., Disp: 240 tablet, Rfl: 3 .  traZODone (DESYREL) 100 MG tablet, Take 1 tablet (100 mg total) by mouth at bedtime as needed for sleep., Disp: 30 tablet, Rfl: 3 .  VENTOLIN HFA 108 (90 Base) MCG/ACT inhaler, INHALE 2 PUFFS INTO THE LUNGS EVERY  6 (SIX) HOURS AS NEEDED FOR WHEEZING OR SHORTNESS OF BREATH. (Patient taking differently: Inhale 2 puffs into the lungs every 6 (six) hours as needed for wheezing or shortness of breath. ), Disp: 18 g, Rfl: 0 .  XARELTO 20 MG TABS tablet, TAKE ONE TABLET BY MOUTH ONCE DAILY (Patient taking differently: Take 20 mg by mouth daily with supper. ), Disp: 30 tablet, Rfl: 0 Allergies  Allergen Reactions  . Pork-Derived Products Other (See Comments)    Religious preference     Social History   Socioeconomic History  . Marital status: Divorced    Spouse name: Not on file  . Number of children: 3  . Years of education: Not on file  . Highest education level: Not on file  Occupational History  . Occupation: disabled  Social Needs  . Financial resource strain: Not on file  . Food insecurity:    Worry: Not on file    Inability: Not on file  . Transportation needs:    Medical: Not on file    Non-medical: Not on file  Tobacco Use  . Smoking status:  Current Every Day Smoker    Packs/day: 0.50    Years: 40.00    Pack years: 20.00    Types: Cigarettes  . Smokeless tobacco: Never Used  Substance and Sexual Activity  . Alcohol use: No    Alcohol/week: 0.0 standard drinks  . Drug use: No  . Sexual activity: Not Currently  Lifestyle  . Physical activity:    Days per week: Not on file    Minutes per session: Not on file  . Stress: Not on file  Relationships  . Social connections:    Talks on phone: Not on file    Gets together: Not on file    Attends religious service: Not on file    Active member of club or organization: Not on file    Attends meetings of clubs or organizations: Not on file    Relationship status: Not on file  . Intimate partner violence:    Fear of current or ex partner: Not on file    Emotionally abused: Not on file    Physically abused: Not on file    Forced sexual activity: Not on file  Other Topics Concern  . Not on file  Social History Narrative   Has an apartment with a roommate. He was living on the streets in October 2014.  He reports that his father died in [place] in October 2014.  He is divorced.  He is no longer estranged from his son, but still from his daughter who lives locally.  Neither of his parents, nor any siblings have any history of CAD.    Physical Exam Pulmonary:     Effort: Pulmonary effort is normal. No respiratory distress.     Breath sounds: No wheezing or rales.  Abdominal:     General: There is no distension.     Tenderness: There is no abdominal tenderness. There is no guarding.  Musculoskeletal:     Right lower leg: Edema present.     Left lower leg: Edema present.     Comments: +3 edema noted to mid-shaft of both legs  Skin:    General: Skin is warm and dry.         Future Appointments  Date Time Provider Department Center  05/08/2018 11:30 AM [facility] PA/NP [facility] None     BP 140/64 (BP Location: Right Arm, Patient Position: Sitting, Cuff Size: Normal)  Pulse 93   Resp 16   Wt 230 lb (104.3 kg)   SpO2 97%   BMI 38.27 kg/m  CBG 245  Weight yesterday-didn't weigh Last visit weight-216 (1/16)  ATF pt CAO x4 sitting on the side of the bed smoking a cigarette. He stated that he has taken all of his medications without missing any doses. However he's hasn't taken the antibiotics that were prescribed to him 12/26; amoxicillin, clarithromycin, cephalexin  He has two pill packs of last months meds left over from last month, which he says is because he was in the hospital.    Pt has a weight increase of 14 lbs since 1/16.  He stated that he "doesn't know why he's gained weight".  He does have sob with excertion.  I spoke with Marylene LandAngela at the [facility] and she stated that she will speak with Amy and call back. I set aside potassium, torsemide and metolazone just in case she calls him directly.    [name], also stated that he plans [place] next [place]". He stated that he will speak with his pcp about the same, because he's worried about having enough medications for the duration of the trip.  Medication ordered: none  Marene Gilliam, EMT Paramedic [phone] 04/23/2018    ACTION: Home visit completed

## 2018-04-23 NOTE — Telephone Encounter (Signed)
Dee (paramedicine) called to report increased SOB, weight gain (01/16: 216 and 1/30: 230), edema (+3) in both legs. Pt wanted an earlier appt. To address his issues. I put pt in for 02/14 to see APP clinic. Pt is currently taking torsemide 20 mg (Take 4 tablets (80 mg total) by mouth 2 (two) times daily). Please advise.

## 2018-04-23 NOTE — Telephone Encounter (Signed)
Please call Dee with HF Paramedicine.   Instruct him to take metolazone 2.5 mg for the next 2 days.   Needs an appointment next week.   Amy Clegg NP-C  12:39 PM

## 2018-04-30 ENCOUNTER — Encounter (HOSPITAL_COMMUNITY): Payer: Self-pay

## 2018-04-30 ENCOUNTER — Ambulatory Visit (HOSPITAL_COMMUNITY)
Admission: RE | Admit: 2018-04-30 | Discharge: 2018-04-30 | Disposition: A | Discharge status: Home / Self Care | Payer: Medicare Other | Source: Ambulatory Visit | Attending: Internal Medicine | Admitting: Internal Medicine

## 2018-04-30 VITALS — BP 128/72 | HR 108 | Wt 226.2 lb

## 2018-04-30 DIAGNOSIS — I4891 Unspecified atrial fibrillation: Secondary | ICD-10-CM | POA: Insufficient documentation

## 2018-04-30 DIAGNOSIS — Z951 Presence of aortocoronary bypass graft: Secondary | ICD-10-CM | POA: Insufficient documentation

## 2018-04-30 DIAGNOSIS — Z794 Long term (current) use of insulin: Secondary | ICD-10-CM | POA: Diagnosis not present

## 2018-04-30 DIAGNOSIS — I25811 Atherosclerosis of native coronary artery of transplanted heart without angina pectoris: Secondary | ICD-10-CM | POA: Diagnosis not present

## 2018-04-30 DIAGNOSIS — I5022 Chronic systolic (congestive) heart failure: Secondary | ICD-10-CM | POA: Insufficient documentation

## 2018-04-30 DIAGNOSIS — F17210 Nicotine dependence, cigarettes, uncomplicated: Secondary | ICD-10-CM | POA: Diagnosis not present

## 2018-04-30 DIAGNOSIS — I255 Ischemic cardiomyopathy: Secondary | ICD-10-CM

## 2018-04-30 DIAGNOSIS — Z833 Family history of diabetes mellitus: Secondary | ICD-10-CM | POA: Diagnosis not present

## 2018-04-30 DIAGNOSIS — Z720 Tobacco use: Secondary | ICD-10-CM

## 2018-04-30 DIAGNOSIS — E1151 Type 2 diabetes mellitus with diabetic peripheral angiopathy without gangrene: Secondary | ICD-10-CM | POA: Insufficient documentation

## 2018-04-30 DIAGNOSIS — I739 Peripheral vascular disease, unspecified: Secondary | ICD-10-CM

## 2018-04-30 DIAGNOSIS — Z79899 Other long term (current) drug therapy: Secondary | ICD-10-CM | POA: Insufficient documentation

## 2018-04-30 DIAGNOSIS — I4819 Other persistent atrial fibrillation: Secondary | ICD-10-CM | POA: Diagnosis not present

## 2018-04-30 DIAGNOSIS — Z95810 Presence of automatic (implantable) cardiac defibrillator: Secondary | ICD-10-CM | POA: Diagnosis not present

## 2018-04-30 DIAGNOSIS — E7800 Pure hypercholesterolemia, unspecified: Secondary | ICD-10-CM | POA: Insufficient documentation

## 2018-04-30 DIAGNOSIS — I482 Chronic atrial fibrillation, unspecified: Secondary | ICD-10-CM | POA: Diagnosis not present

## 2018-04-30 DIAGNOSIS — I2510 Atherosclerotic heart disease of native coronary artery without angina pectoris: Secondary | ICD-10-CM | POA: Insufficient documentation

## 2018-04-30 DIAGNOSIS — Z7901 Long term (current) use of anticoagulants: Secondary | ICD-10-CM | POA: Insufficient documentation

## 2018-04-30 DIAGNOSIS — E785 Hyperlipidemia, unspecified: Secondary | ICD-10-CM | POA: Insufficient documentation

## 2018-04-30 DIAGNOSIS — I252 Old myocardial infarction: Secondary | ICD-10-CM | POA: Diagnosis not present

## 2018-04-30 DIAGNOSIS — I110 Hypertensive heart disease with heart failure: Secondary | ICD-10-CM | POA: Insufficient documentation

## 2018-04-30 MED ORDER — TORSEMIDE 20 MG PO TABS: 80.0000 mg | ORAL_TABLET | Freq: Two times a day (BID) | ORAL | 3 refills | Status: AC

## 2018-04-30 MED ORDER — RIVAROXABAN 20 MG PO TABS: 20.0000 mg | ORAL_TABLET | Freq: Every day | ORAL | 3 refills | Status: AC

## 2018-04-30 MED ORDER — HYDRALAZINE HCL 25 MG PO TABS: ORAL_TABLET | ORAL | 3 refills | Status: AC

## 2018-04-30 MED ORDER — SPIRONOLACTONE 25 MG PO TABS: 12.5000 mg | ORAL_TABLET | Freq: Every day | ORAL | 3 refills | Status: AC

## 2018-04-30 MED ORDER — DIGOXIN 125 MCG PO TABS: 62.5000 ug | ORAL_TABLET | Freq: Every day | ORAL | 3 refills | Status: AC

## 2018-04-30 MED ORDER — ISOSORBIDE MONONITRATE ER 30 MG PO TB24: 30.0000 mg | ORAL_TABLET | Freq: Every day | ORAL | 3 refills | Status: AC

## 2018-04-30 MED ORDER — ATORVASTATIN CALCIUM 40 MG PO TABS: ORAL_TABLET | ORAL | 3 refills | Status: AC

## 2018-04-30 MED ORDER — POTASSIUM CHLORIDE CRYS ER 20 MEQ PO TBCR: 40.0000 meq | EXTENDED_RELEASE_TABLET | Freq: Every day | ORAL | 3 refills | Status: AC | PRN

## 2018-04-30 NOTE — Progress Notes (Signed)
Patient ID: Jacob PunchesYahia Nieland, male   DOB: [birth year], 63 y.o.   MRN: [id number]    [facility] Note   PCP: [facility] Primary HF Cardiologist: Dr Gala RomneyBensimhon   HPI: Jacob [name] is a 63 y.o. male [place] male with history of systolic HF EF 20-25% via Echo 08/03/14 s/p INCEPTA ICD implant 6/14, RV dysfunction, CAD s/p CABG x 2 w/ RF MAZE 7/14 at [facility], DM type II, Chronic afib on xarelto, GI bleed 11/28/2014, and HLD.   Admitted the end of August 2016  with increased dyspnea on exertion. Later found to be in cardiogenic shock . At one point on dual pressors milrinone and norepi. Diuresed with IV lasix and transitioned to po lasix. Hospital course complicated by GI bleed and A fib RVR. Loaded on amio but later placed on toprol xl for rate control. On 9/5 had EGD with duodenal bleed with clip applied. He was discharged 9/12 with D/C weight 203 pounds.   Admitted 1/29 - 04/24/17 with CP and HF. Cath 04/23/17 as below with stable CAD and compensated filling pressures after IV diuresis.   Admitted 6/21 - 09/13/17 for CP. Troponin flat at 0.06. He was diuresed with IV lasix, and then left on 09/13/17, though he was requested to stay one more day.   Today he returns for HF follow up. Plans to returns to [place] for 2 months. Overall feeling fine. Has ongoing leg pain and leg fatigue with exertion. Denies PND/Orthopnea. SOB with exertion. No chest pain.   Appetite ok. No fever or chills. Weight at home pounds. Smoking 1/2 PPD.Taking all medications. Uses a bubble pack. He no longer drives and requires assistance with transportation. Unable to drive due to glaucoma. Followed by HF Paramedicine.   SH: Lives in low income housing.   ECHO 03/28/18 EF 25-30 % RV moderately function.    [facility]  Ost RCA to Prox RCA lesion is 100% stenosed.  Ost Cx to Prox Cx lesion is 99% stenosed.  Ost LAD to Prox LAD lesion is 40% stenosed.  Prox LAD to Mid LAD lesion is 40% stenosed.  Dist  LAD lesion is 70% stenosed.  Findings: Ao = 126/66 (86)  LV = 118/14 RA = 8 RV = 44/8 PA = 52/17 (34) PCW = 17 Fick cardiac output/index = 4.2/2.0 PVR = 4.0 WU Ao sat = 98% PA sat = 60%, 61% Assessment: 1. 3v CAD with stable revascularization with patent SVG to LPDA and SVG to Ramus 2. LAD with non-obstructive disease 3. Filling pressures normal 4. Moderately depressed cardiac output  SH:  Social History   Socioeconomic History  . Marital status: Divorced    Spouse name: Not on file  . Number of children: 3  . Years of education: Not on file  . Highest education level: Not on file  Occupational History  . Occupation: disabled  Social Needs  . Financial resource strain: Not on file  . Food insecurity:    Worry: Not on file    Inability: Not on file  . Transportation needs:    Medical: Not on file    Non-medical: Not on file  Tobacco Use  . Smoking status: Current Every Day Smoker    Packs/day: 0.50    Years: 40.00    Pack years: 20.00    Types: Cigarettes  . Smokeless tobacco: Never Used  Substance and Sexual Activity  . Alcohol use: No    Alcohol/week: 0.0 standard drinks  . Drug use:  No  . Sexual activity: Not Currently  Lifestyle  . Physical activity:    Days per week: Not on file    Minutes per session: Not on file  . Stress: Not on file  Relationships  . Social connections:    Talks on phone: Not on file    Gets together: Not on file    Attends religious service: Not on file    Active member of club or organization: Not on file    Attends meetings of clubs or organizations: Not on file    Relationship status: Not on file  . Intimate partner violence:    Fear of current or ex partner: Not on file    Emotionally abused: Not on file    Physically abused: Not on file    Forced sexual activity: Not on file  Other Topics Concern  . Not on file  Social History Narrative   Has an apartment with a roommate. He was living on the streets in October  2014.  He reports that his father died in [place] in October 2014.  He is divorced.  He is no longer estranged from his son, but still from his daughter who lives locally.  Neither of his parents, nor any siblings have any history of CAD.   FH:  Family History  Problem Relation Age of Onset  . Diabetes Mother    Past Medical History:  Diagnosis Date  . Atrial fibrillation (HCC)    RVR 10/2014  . Automatic implantable cardioverter-defibrillator in situ   . CAD (coronary artery disease) Sept 2013   s/p cardiac cath showing occlusion of small RCA with collaterals  . CHF (congestive heart failure) (HCC)    20 to 25 % EF and RV dysfunction by 07/2014 echo   . Chronic anticoagulation    on xarelto.   . High cholesterol   . Hypertension   . Myocardial infarction (HCC) 2014  . Noncompliance    homelessness contributing.   . Peripheral arterial disease (HCC)   . Type II diabetes mellitus (HCC)    Current Outpatient Medications  Medication Sig Dispense Refill  . ACCU-CHEK SOFTCLIX LANCETS lancets Use for once daily testing of blood sugar 100 each 12  . acetaminophen-codeine (TYLENOL #3) 300-30 MG tablet TAKE 1 TABLET BY MOUTH AT BEDTIME AS NEEDED FOR MODERATE PAIN. 30 tablet 0  . albuterol (PROVENTIL) (2.5 MG/3ML) 0.083% nebulizer solution Take 3 mLs (2.5 mg total) by nebulization every 6 (six) hours as needed for wheezing or shortness of breath. 150 mL 1  . allopurinol (ZYLOPRIM) 100 MG tablet TAKE 2 TABLETS (200 MG TOTAL) BY MOUTH EVERY MORNING (Patient taking differently: Take 200 mg by mouth daily. ) 60 tablet 3  . atorvastatin (LIPITOR) 40 MG tablet TAKE 1 TABLET BY MOUTH EVERY EVENING AT 6 PM (Patient taking differently: Take 40 mg by mouth daily at 6 PM. ) 30 tablet 3  . Blood Glucose Monitoring Suppl (ACCU-CHEK AVIVA) device Use as instructed1 times daily before meals 1 each 0  . cetirizine (ZYRTEC) 10 MG tablet TAKE 1 TABLET (10 MG TOTAL) BY MOUTH DAILY. 30 tablet 2  . digoxin  (LANOXIN) 0.125 MG tablet Take 0.5 tablets (62.5 mcg total) by mouth daily. 14 tablet 3  . gabapentin (NEURONTIN) 300 MG capsule Take 1 capsule (300 mg total) by mouth daily. 30 capsule 3  . glipiZIDE (GLUCOTROL) 10 MG tablet Take 1 tablet (10 mg total) by mouth 2 (two) times daily before a meal. 60 tablet  5  . glucose blood (ACCU-CHEK AVIVA) test strip Use as instructed for 1 times daily testing of blood sugar 100 each 12  . hydrALAZINE (APRESOLINE) 25 MG tablet TAKE 1 TABLET BY MOUTH THREE TIMES DAILY ( EVERY MORNING, NOON,EVENING ) (Patient taking differently: Take 25 mg by mouth 3 (three) times daily. ( EVERY MORNING, NOON,EVENING )) 90 tablet 3  . Insulin Glargine (LANTUS) 100 UNIT/ML Solostar Pen Inject 48 Units into the skin daily at 10 pm. (Patient taking differently: Inject 50 Units into the skin daily at 10 pm. ) 5 pen 5  . isosorbide mononitrate (IMDUR) 30 MG 24 hr tablet Take 1 tablet (30 mg total) by mouth daily. 30 tablet 3  . Lancet Devices (ACCU-CHEK SOFTCLIX) lancets Use as instructed for once daily testing of blood sugar 1 each 0  . latanoprost (XALATAN) 0.005 % ophthalmic solution Place 1 drop into both eyes daily.    . metolazone (ZAROXOLYN) 2.5 MG tablet TAKE 1 TABLET ONCE AS DIRECTED BY CHF CLINIC. 10 tablet 0  . Misc. Devices MISC Shower chair.  Diagnosis: CHF 1 each 0  . omeprazole (PRILOSEC) 20 MG capsule Take 1 capsule (20 mg total) by mouth 2 (two) times daily before a meal. 60 capsule 3  . potassium chloride SA (K-DUR,KLOR-CON) 20 MEQ tablet Take 2 tablets (40 mEq total) by mouth daily as needed. TAKE ONLY ON DAYS YOU TAKE METOLAZONE!! (Patient taking differently: Take 20 mEq by mouth 2 (two) times a week. TAKE ONLY ON DAYS YOU TAKE METOLAZONE!! Monday and Friday) 60 tablet 11  . spironolactone (ALDACTONE) 25 MG tablet Take 0.5 tablets (12.5 mg total) by mouth daily. 30 tablet 3  . torsemide (DEMADEX) 20 MG tablet Take 4 tablets (80 mg total) by mouth 2 (two) times daily.  240 tablet 3  . traZODone (DESYREL) 100 MG tablet Take 1 tablet (100 mg total) by mouth at bedtime as needed for sleep. 30 tablet 3  . VENTOLIN HFA 108 (90 Base) MCG/ACT inhaler INHALE 2 PUFFS INTO THE LUNGS EVERY 6 (SIX) HOURS AS NEEDED FOR WHEEZING OR SHORTNESS OF BREATH. (Patient taking differently: Inhale 2 puffs into the lungs every 6 (six) hours as needed for wheezing or shortness of breath. ) 18 g 0  . XARELTO 20 MG TABS tablet TAKE ONE TABLET BY MOUTH ONCE DAILY (Patient taking differently: Take 20 mg by mouth daily with supper. ) 30 tablet 0  . diclofenac sodium (VOLTAREN) 1 % GEL APPLY 4 GRAMS TOPICALLY 4 (FOUR) TIMES DAILY (Patient not taking: Reported on 03/27/2018) 100 g 0  . meclizine (ANTIVERT) 25 MG tablet TAKE 1 TABLET (25 MG TOTAL) BY MOUTH 3 (THREE) TIMES DAILY AS NEEDED FOR DIZZINESS. (Patient not taking: Reported on 04/30/2018) 60 tablet 0   No current facility-administered medications for this encounter.    Vitals:   04/30/18 1419  BP: 128/72  Pulse: (!) 108  SpO2: 96%  Weight: 102.6 kg (226 lb 3.2 oz)   Wt Readings from Last 3 Encounters:  04/30/18 102.6 kg (226 lb 3.2 oz)  04/23/18 104.3 kg (230 lb)  04/09/18 98.2 kg (216 lb 6.4 oz)     PHYSICAL EXAM: General:  Well appearing. No resp difficulty HEENT: normal Neck: supple. JVP 6-7 . Carotids 2+ bilat; no bruits. No lymphadenopathy or thryomegaly appreciated. Cor: PMI nondisplaced. Irregular rate & rhythm. No rubs, gallops or murmurs. Lungs: clear Abdomen: soft, nontender, nondistended. No hepatosplenomegaly. No bruits or masses. Good bowel sounds. Extremities: no cyanosis, clubbing, rash, edema  Neuro: alert & orientedx3, cranial nerves grossly intact. moves all 4 extremities w/o difficulty. Affect pleasant   ASSESSMENT & PLAN: 1. Chronic Systolic Heart Failure: Echo 10/2015 EF 25-30%. Has [employer] ICD. Echo 08/2017: EF 20-25% - R/LHC 03/2017 as above with stable CAD and compensated filling pressures. CI  2.0 -NYHA IIIb. Functionally limited dyspnea but also likely PAD playing a large role.  Volume status stable. Continue torsemide 80 mg twice a day.   - Continue spiro 25 mg daily.  - Continue digoxin 0.0625 mg daily.   - Continue hydralazine 25 mg TID + imdur 30 mg daily.  - No beta blocker with acute decompensation   2. CAD s/p CABG 2014 - R/LHC 03/2017 as above with stable CAD and compensated filling pressures.  - No s/s ischemia  - Continue xarelto, statin  2. Chronic A fib -Uncontrolled. Refuses cardioversion.  - Continue Xarelto for anticoagulation. No bleeding problems.  - High Res Chest CT with questionable fibrotic interstitial pneumonitis cannot be excluded.  - No longer on amiodarone due to changes on Chest CT.   3. H/O GI Bleed - duodenal ulcer with clip 11/2003 - Denies bleeding.    4. PAD- Last ABI 2015  - R EIA stent with known bilateral SFA occlusion.  - Follows with Dr. Allyson Sabal: "He is not a candidate for endovascular therapy of his SFAs and would require femoropopliteal bypass grafting which I think he would be high risk for given his cardiac situation" -  5. Social  Issues - Transportation barriers.  - Continue paramedicine.  6. Smoking - Discussed smoking cessation.   7. HTN Stable.    Follow up in 3 months. Doing well today. He seems to be more compliant with medications and has been able to do a better job with bubble pack medications. Today we sent in refills for 3 month supply so he will have medications when goes to [place].    Tonye Becket, NP  2:23 PM

## 2018-04-30 NOTE — Patient Instructions (Signed)
All of your heart medicines have been refilled.   Your physician recommends that you schedule a follow-up appointment in: 3 months with Dr. Shirlee Latch

## 2018-05-06 ENCOUNTER — Other Ambulatory Visit (HOSPITAL_COMMUNITY): Payer: Self-pay

## 2018-05-06 NOTE — Progress Notes (Signed)
Paramedicine Encounter    Patient ID: Jacob PunchesYahia [name], male    DOB: [birth year], 63 y.o.   MRN: [id number]    Patient Care Team: [name], Enobong, MD as PCP - General (Family Medicine) Bensimhon, Bevelyn Bucklesaniel R, MD as PCP - Cardiology (Cardiology) Marinus Mawaylor, Gregg W, MD as PCP - Electrophysiology (Cardiology) Clarisa SchoolsScott, Tim W, RN as Registered Nurse Rourk, Gerrit Friendsobert M, MD as Consulting Physician (Gastroenterology) Pleasant, Dennard SchaumannFrances H, RN as [facility] Care Management Uris, Wyn QuakerJenna H, LCSW as Social Worker (Licensed Clinical Social Worker)  Patient Active Problem List   Diagnosis Date Noted  . Anemia requiring transfusions   . Hypokalemia   . Vitamin D deficiency 07/07/2017  . Exertional angina (HCC)   . Chronic systolic heart failure (HCC) 12/17/2016  . Hyperglycemia 12/02/2016  . Osteoarthritis of right knee 12/02/2016  . Congestive heart disease (HCC) 01/01/2016  . Diabetic neuropathy (HCC) 11/28/2015  . Ascites 11/09/2015  . CHF (congestive heart failure) (HCC) 11/09/2015  . Insomnia 04/18/2015  . Claudication of right lower extremity (HCC) 04/07/2015  . Cardiomyopathy, ischemic 04/07/2015  . Chronic anticoagulation 03/30/2015  . Cardiorenal syndrome with renal failure   . SOB (shortness of breath)   . Morbid obesity due to excess calories (HCC)   . Helicobacter pylori ab+ 12/12/2014  . Calf pain   . Duodenal ulcer with hemorrhage   . Hemorrhagic shock (HCC)   . Diabetes mellitus (HCC)   . Coronary artery disease involving native coronary artery of native heart without angina pectoris   . Atrial fibrillation with rapid ventricular response (HCC) 10/29/2014  . Chest pain   . Atherosclerosis of native arteries of extremity with intermittent claudication (HCC) 09/06/2014  . Atrial fibrillation with controlled ventricular response (HCC) 08/03/2014  . Bacteremia   . DM (diabetes mellitus), type 2 with peripheral vascular complications (HCC) 08/01/2014  . Anemia of chronic disease  08/01/2014  . PVD (peripheral vascular disease) (HCC) 10/29/2013  . Tobacco abuse 05/13/2013  . AF (atrial fibrillation) (HCC) 02/11/2013  . ICD - in place- BS [facility] [place] 02/11/2013  . Microcytic anemia 01/11/2013  . CAD- s/p CABG [facility]  . Noncompliance 12/01/2011  . Essential hypertension 10/30/2006    Current Outpatient Medications:  .  ACCU-CHEK SOFTCLIX LANCETS lancets, Use for once daily testing of blood sugar, Disp: 100 each, Rfl: 12 .  acetaminophen-codeine (TYLENOL #3) 300-30 MG tablet, TAKE 1 TABLET BY MOUTH AT BEDTIME AS NEEDED FOR MODERATE PAIN., Disp: 30 tablet, Rfl: 0 .  albuterol (PROVENTIL) (2.5 MG/3ML) 0.083% nebulizer solution, Take 3 mLs (2.5 mg total) by nebulization every 6 (six) hours as needed for wheezing or shortness of breath., Disp: 150 mL, Rfl: 1 .  allopurinol (ZYLOPRIM) 100 MG tablet, TAKE 2 TABLETS (200 MG TOTAL) BY MOUTH EVERY MORNING (Patient taking differently: Take 200 mg by mouth daily. ), Disp: 60 tablet, Rfl: 3 .  atorvastatin (LIPITOR) 40 MG tablet, TAKE 1 TABLET BY MOUTH EVERY EVENING AT 6 PM, Disp: 90 tablet, Rfl: 3 .  Blood Glucose Monitoring Suppl (ACCU-CHEK AVIVA) device, Use as instructed1 times daily before meals, Disp: 1 each, Rfl: 0 .  cetirizine (ZYRTEC) 10 MG tablet, TAKE 1 TABLET (10 MG TOTAL) BY MOUTH DAILY., Disp: 30 tablet, Rfl: 2 .  diclofenac sodium (VOLTAREN) 1 % GEL, APPLY 4 GRAMS TOPICALLY 4 (FOUR) TIMES DAILY (Patient not taking: Reported on 03/27/2018), Disp: 100 g, Rfl: 0 .  digoxin (LANOXIN) 0.125 MG tablet, Take 0.5 tablets (62.5 mcg total)  by mouth daily., Disp: 45 tablet, Rfl: 3 .  gabapentin (NEURONTIN) 300 MG capsule, Take 1 capsule (300 mg total) by mouth daily., Disp: 30 capsule, Rfl: 3 .  glipiZIDE (GLUCOTROL) 10 MG tablet, Take 1 tablet (10 mg total) by mouth 2 (two) times daily before a meal., Disp: 60 tablet, Rfl: 5 .  glucose blood (ACCU-CHEK AVIVA) test strip, Use as instructed for 1  times daily testing of blood sugar, Disp: 100 each, Rfl: 12 .  hydrALAZINE (APRESOLINE) 25 MG tablet, TAKE 1 TABLET BY MOUTH THREE TIMES DAILY ( EVERY MORNING, NOON,EVENING ), Disp: 270 tablet, Rfl: 3 .  Insulin Glargine (LANTUS) 100 UNIT/ML Solostar Pen, Inject 48 Units into the skin daily at 10 pm. (Patient taking differently: Inject 50 Units into the skin daily at 10 pm. ), Disp: 5 pen, Rfl: 5 .  isosorbide mononitrate (IMDUR) 30 MG 24 hr tablet, Take 1 tablet (30 mg total) by mouth daily., Disp: 90 tablet, Rfl: 3 .  Lancet Devices (ACCU-CHEK SOFTCLIX) lancets, Use as instructed for once daily testing of blood sugar, Disp: 1 each, Rfl: 0 .  latanoprost (XALATAN) 0.005 % ophthalmic solution, Place 1 drop into both eyes daily., Disp: , Rfl:  .  meclizine (ANTIVERT) 25 MG tablet, TAKE 1 TABLET (25 MG TOTAL) BY MOUTH 3 (THREE) TIMES DAILY AS NEEDED FOR DIZZINESS. (Patient not taking: Reported on 04/30/2018), Disp: 60 tablet, Rfl: 0 .  metolazone (ZAROXOLYN) 2.5 MG tablet, TAKE 1 TABLET ONCE AS DIRECTED BY CHF CLINIC., Disp: 10 tablet, Rfl: 0 .  Misc. Devices MISC, Paediatric nurse.  Diagnosis: CHF, Disp: 1 each, Rfl: 0 .  omeprazole (PRILOSEC) 20 MG capsule, Take 1 capsule (20 mg total) by mouth 2 (two) times daily before a meal., Disp: 60 capsule, Rfl: 3 .  potassium chloride SA (K-DUR,KLOR-CON) 20 MEQ tablet, Take 2 tablets (40 mEq total) by mouth daily as needed. TAKE ONLY ON DAYS YOU TAKE METOLAZONE!!, Disp: 30 tablet, Rfl: 3 .  rivaroxaban (XARELTO) 20 MG TABS tablet, Take 1 tablet (20 mg total) by mouth daily., Disp: 90 tablet, Rfl: 3 .  spironolactone (ALDACTONE) 25 MG tablet, Take 0.5 tablets (12.5 mg total) by mouth daily., Disp: 45 tablet, Rfl: 3 .  torsemide (DEMADEX) 20 MG tablet, Take 4 tablets (80 mg total) by mouth 2 (two) times daily., Disp: 720 tablet, Rfl: 3 .  traZODone (DESYREL) 100 MG tablet, Take 1 tablet (100 mg total) by mouth at bedtime as needed for sleep., Disp: 30 tablet, Rfl: 3 .   VENTOLIN HFA 108 (90 Base) MCG/ACT inhaler, INHALE 2 PUFFS INTO THE LUNGS EVERY 6 (SIX) HOURS AS NEEDED FOR WHEEZING OR SHORTNESS OF BREATH. (Patient taking differently: Inhale 2 puffs into the lungs every 6 (six) hours as needed for wheezing or shortness of breath. ), Disp: 18 g, Rfl: 0 Allergies  Allergen Reactions  . Pork-Derived Products Other (See Comments)    Religious preference     Social History   Socioeconomic History  . Marital status: Divorced    Spouse name: Not on file  . Number of children: 3  . Years of education: Not on file  . Highest education level: Not on file  Occupational History  . Occupation: disabled  Social Needs  . Financial resource strain: Not on file  . Food insecurity:    Worry: Not on file    Inability: Not on file  . Transportation needs:    Medical: Not on file    Non-medical: Not on  file  Tobacco Use  . Smoking status: Current Every Day Smoker    Packs/day: 0.50    Years: 40.00    Pack years: 20.00    Types: Cigarettes  . Smokeless tobacco: Never Used  Substance and Sexual Activity  . Alcohol use: No    Alcohol/week: 0.0 standard drinks  . Drug use: No  . Sexual activity: Not Currently  Lifestyle  . Physical activity:    Days per week: Not on file    Minutes per session: Not on file  . Stress: Not on file  Relationships  . Social connections:    Talks on phone: Not on file    Gets together: Not on file    Attends religious service: Not on file    Active member of club or organization: Not on file    Attends meetings of clubs or organizations: Not on file    Relationship status: Not on file  . Intimate partner violence:    Fear of current or ex partner: Not on file    Emotionally abused: Not on file    Physically abused: Not on file    Forced sexual activity: Not on file  Other Topics Concern  . Not on file  Social History Narrative   Has an apartment with a roommate. He was living on the streets in Jan 15, 2013.  He  reports that his father died in [place] in 01/15/2013.  He is divorced.  He is no longer estranged from his son, but still from his daughter who lives locally.  Neither of his parents, nor any siblings have any history of CAD.    Physical Exam      Future Appointments  Date Time Provider Department Center  07/29/2018  2:30 PM [facility] PA/NP [facility] None     BP 104/60 (BP Location: Right Arm, Patient Position: Sitting, Cuff Size: Normal)   Pulse (!) 112   Resp 16   Wt 220 lb 3.2 oz (99.9 kg)   SpO2 98%   BMI 36.64 kg/m  cbg 132  Weight yesterday-221 Last visit weight-230  ATF pt CAO x4 sitting on the side of the bed smoking a cigarette and watching tv. He confirms that he is leaving for [place] on  2/18/ and returning around 4/10.  We are currently working on getting him another month worth of medications for the entire vacation.  He denies sob, chest pain and dizziness.  Pt is still taking his medications daily.  rx bubble packs verified. I will f/u with pt after I speak with the pharmacist.     Medication ordered: entire month of medication bubble packs  Jacob [name], EMT Paramedic [phone] 05/07/2018    ACTION: Home visit completed

## 2018-05-07 ENCOUNTER — Other Ambulatory Visit: Payer: Self-pay | Admitting: Family Medicine

## 2018-05-08 ENCOUNTER — Encounter (HOSPITAL_COMMUNITY): Payer: Medicare Other

## 2018-05-08 ENCOUNTER — Other Ambulatory Visit (HOSPITAL_COMMUNITY): Payer: Self-pay

## 2018-05-08 NOTE — Progress Notes (Signed)
Pt is leaving for [place] next week and will be gone for at least 51 days.  The pharmacy was able to refill another month worth of medications to last until he returns.  I picked up 4 bubble pill packs, insulin, inhaler, trazodone and metolazone. We discussed him taking the metolazone and he stated that he understand not to take them regularly because of the harm to his kidneys.  He will make contact with myself when he returns "sometime in April".

## 2018-05-28 ENCOUNTER — Encounter (HOSPITAL_COMMUNITY): Payer: Medicare Other

## 2018-06-19 ENCOUNTER — Telehealth: Payer: Self-pay | Admitting: Cardiology

## 2018-06-19 NOTE — Telephone Encounter (Signed)
LMOVM for pt to return call in regards to his home monitor and follow. Patient needs an appt w/ [facility] and needs to send a remote transmission.

## 2018-07-04 NOTE — Telephone Encounter (Signed)
done

## 2018-07-29 ENCOUNTER — Other Ambulatory Visit: Payer: Self-pay

## 2018-07-29 ENCOUNTER — Ambulatory Visit (HOSPITAL_COMMUNITY): Admission: RE | Admit: 2018-07-29 | Payer: Medicare Other | Source: Ambulatory Visit

## 2018-08-12 ENCOUNTER — Telehealth: Payer: Self-pay

## 2018-08-12 NOTE — Telephone Encounter (Signed)
As per Maria Ramirez Perez, [facility], the patient's referral is now closed.  

## 2019-06-17 IMAGING — DX DG CHEST 2V
2 series · 2 of 2 positions shown · non-contrast
Comparison: CT chest dated April 22, 2017. Chest x-ray dated
March 06, 2017.

CLINICAL DATA: Shortness of breath.

EXAM:
CHEST - 2 VIEW

[chest pa]
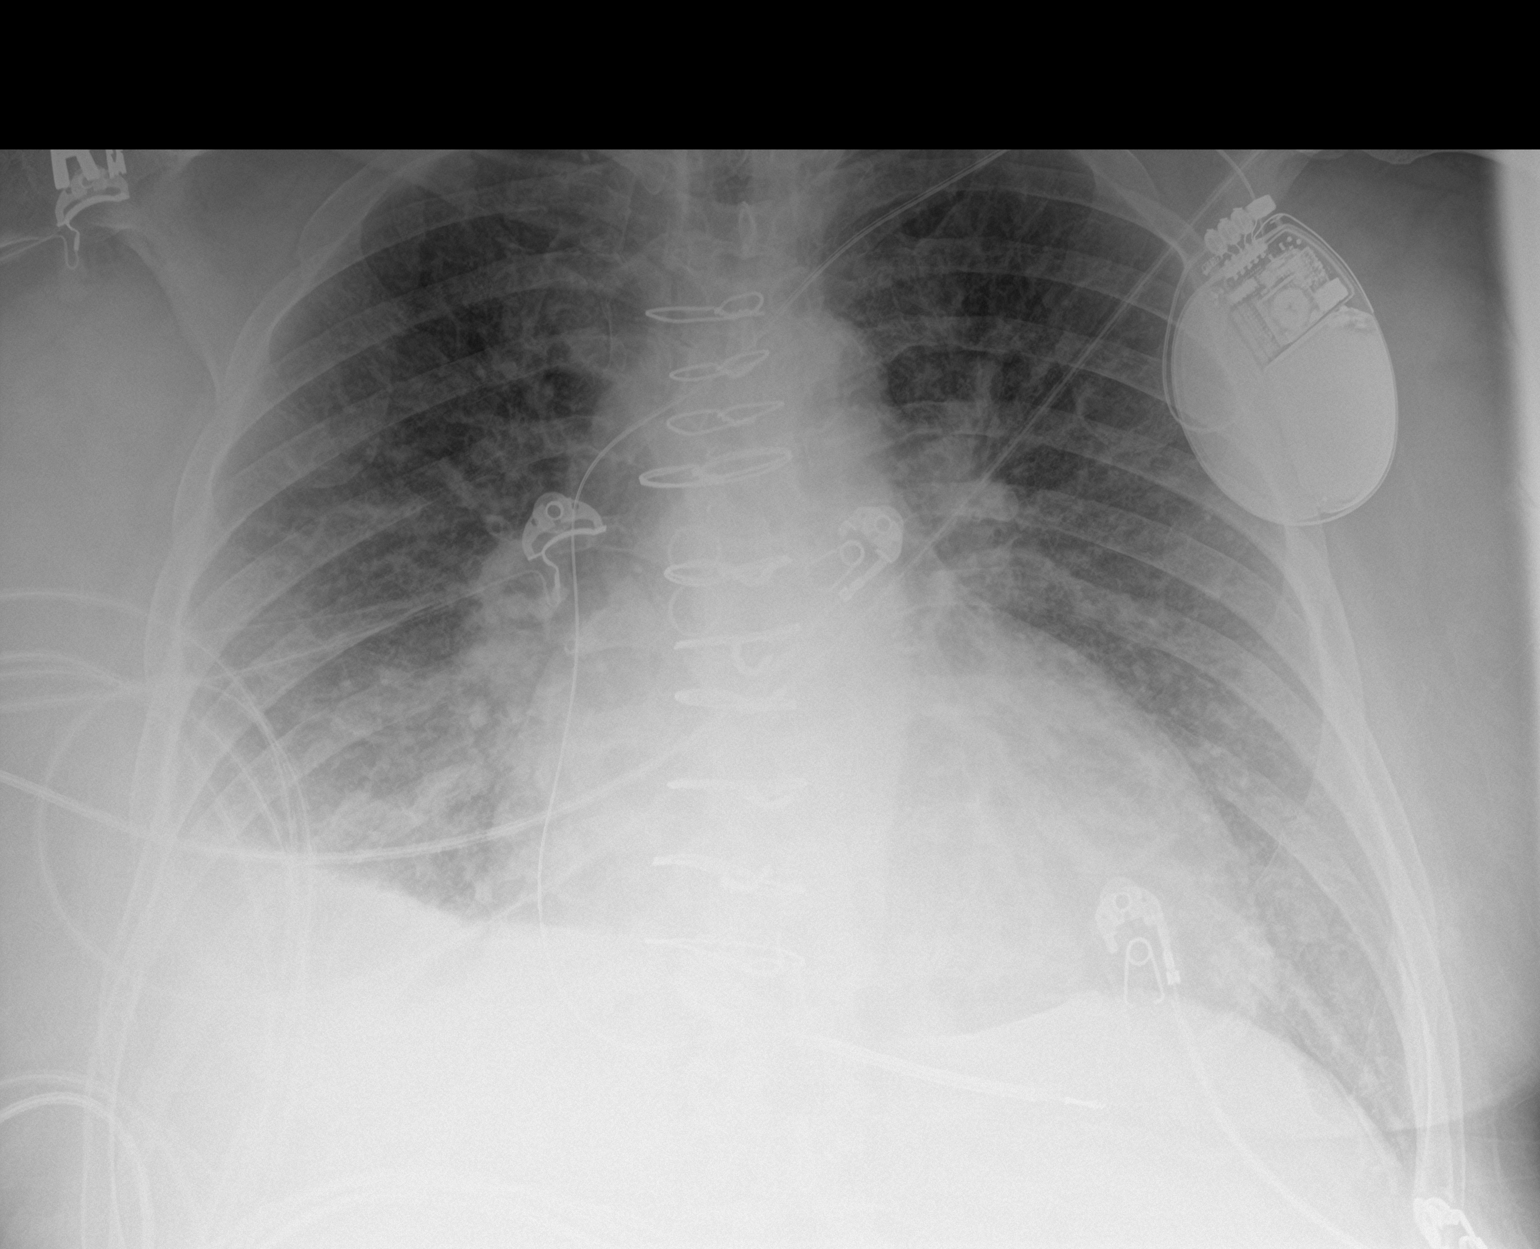

[chest lat]
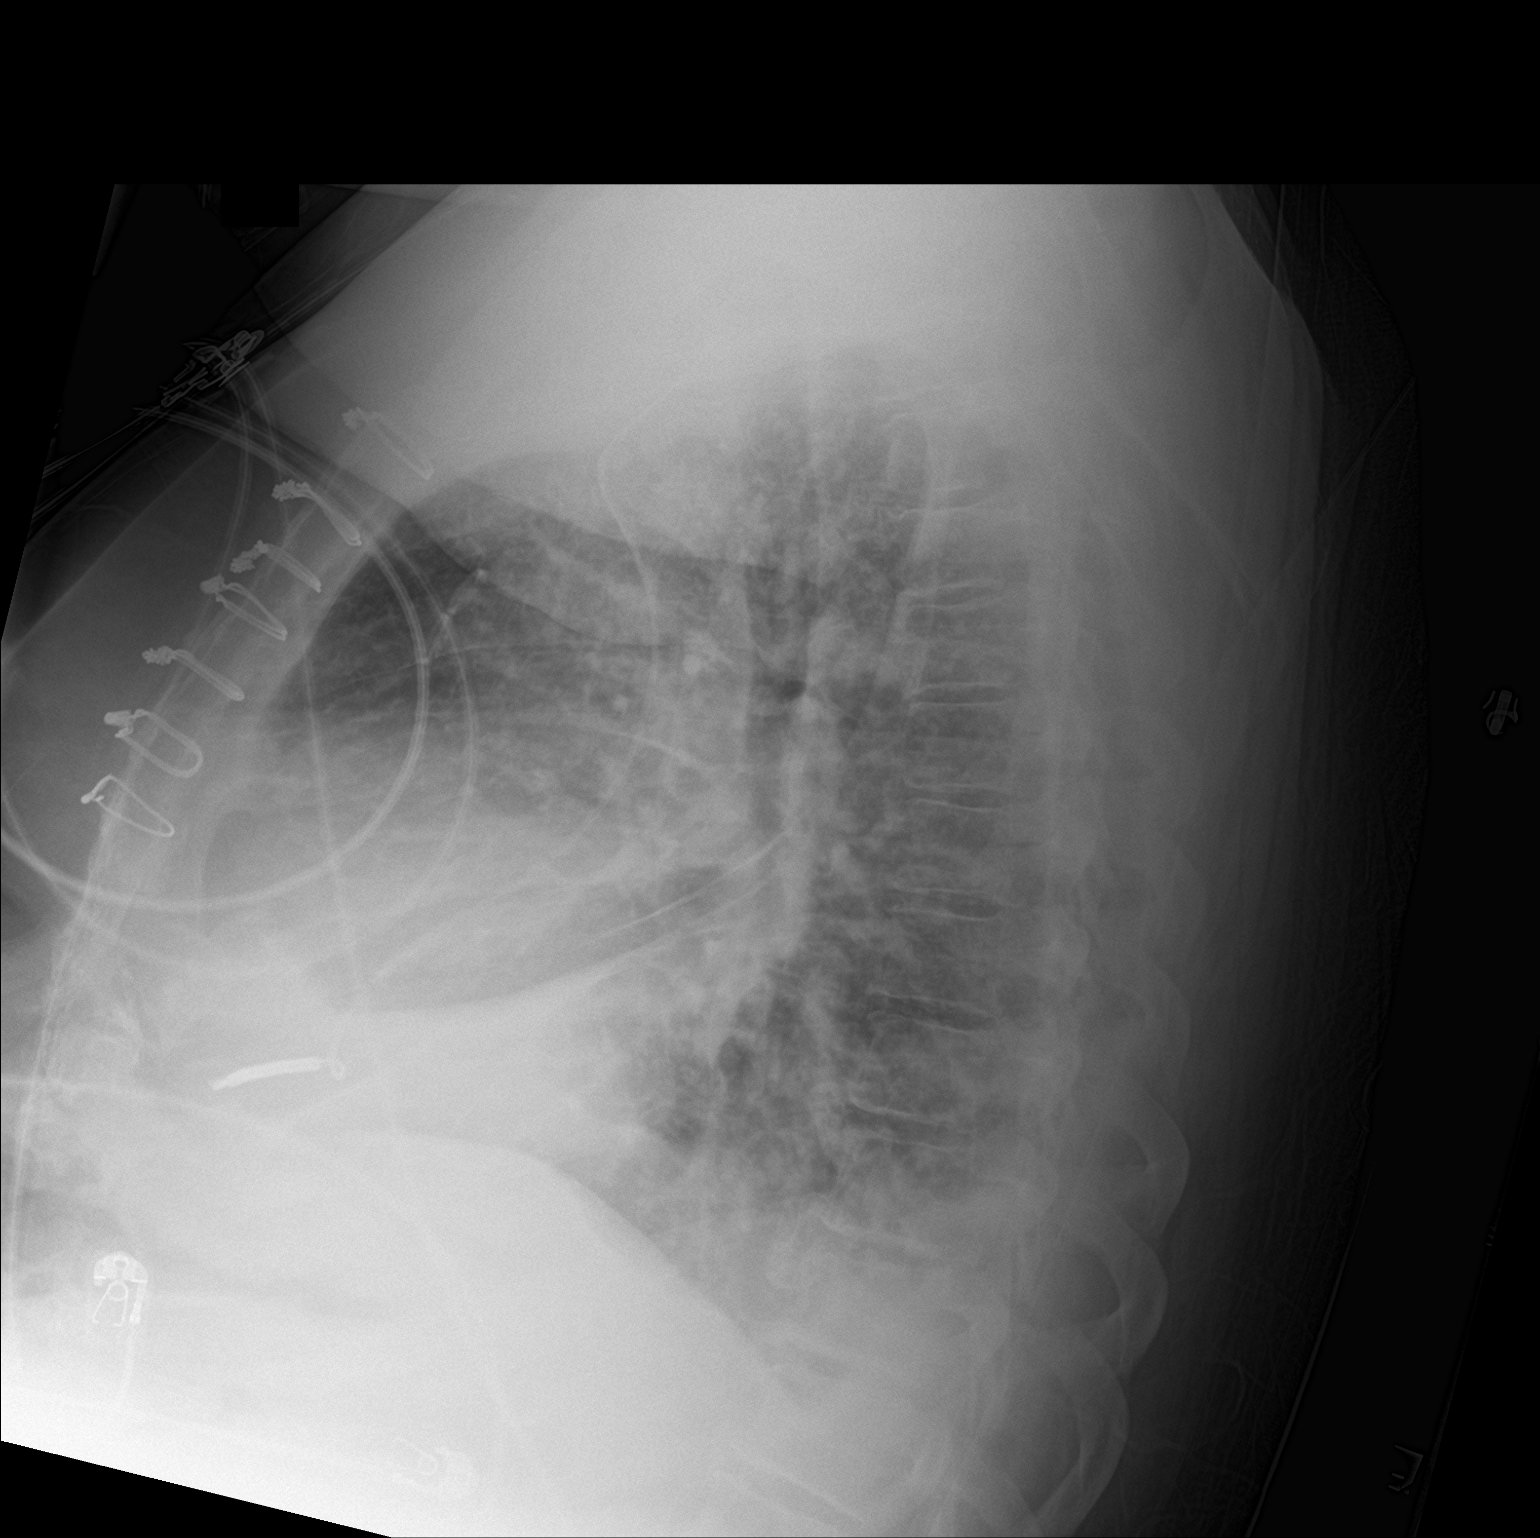

[2 of 2 positions shown; findings below may reference images not displayed]

FINDINGS: Unchanged single lead left chest wall pacemaker. Stable cardiomegaly
status post CABG. Pulmonary vascular congestion and mild diffuse
interstitial thickening. Small right pleural effusion. No
consolidation or pneumothorax. No acute osseous abnormality.
IMPRESSION: Persistent cardiomegaly with vascular congestion, mild interstitial
edema, and small right pleural effusion.

## 2021-01-25 NOTE — Telephone Encounter (Signed)
Error
